# Patient Record
Sex: Male | Born: 1950 | Race: White | Hispanic: No | Marital: Married | Smoking: Former smoker
Health system: Southern US, Community
[De-identification: ages and names within clinical notes are randomized; demographics above are authoritative.]

## PROBLEM LIST (undated history)

## (undated) ENCOUNTER — Emergency Department (HOSPITAL_COMMUNITY): Admission: EM | Payer: Federal, State, Local not specified - PPO | Source: Home / Self Care

## (undated) DIAGNOSIS — Z86711 Personal history of pulmonary embolism: Secondary | ICD-10-CM

## (undated) DIAGNOSIS — R0602 Shortness of breath: Secondary | ICD-10-CM

## (undated) DIAGNOSIS — D649 Anemia, unspecified: Secondary | ICD-10-CM

## (undated) DIAGNOSIS — I10 Essential (primary) hypertension: Secondary | ICD-10-CM

## (undated) DIAGNOSIS — J069 Acute upper respiratory infection, unspecified: Secondary | ICD-10-CM

## (undated) DIAGNOSIS — F419 Anxiety disorder, unspecified: Secondary | ICD-10-CM

## (undated) DIAGNOSIS — K219 Gastro-esophageal reflux disease without esophagitis: Secondary | ICD-10-CM

## (undated) DIAGNOSIS — G8929 Other chronic pain: Secondary | ICD-10-CM

## (undated) DIAGNOSIS — Z903 Acquired absence of stomach [part of]: Secondary | ICD-10-CM

## (undated) DIAGNOSIS — F1010 Alcohol abuse, uncomplicated: Secondary | ICD-10-CM

## (undated) DIAGNOSIS — K2210 Ulcer of esophagus without bleeding: Secondary | ICD-10-CM

## (undated) DIAGNOSIS — K567 Ileus, unspecified: Secondary | ICD-10-CM

## (undated) DIAGNOSIS — C259 Malignant neoplasm of pancreas, unspecified: Secondary | ICD-10-CM

## (undated) DIAGNOSIS — E538 Deficiency of other specified B group vitamins: Secondary | ICD-10-CM

## (undated) DIAGNOSIS — C189 Malignant neoplasm of colon, unspecified: Secondary | ICD-10-CM

## (undated) DIAGNOSIS — K56609 Unspecified intestinal obstruction, unspecified as to partial versus complete obstruction: Secondary | ICD-10-CM

## (undated) DIAGNOSIS — IMO0001 Reserved for inherently not codable concepts without codable children: Secondary | ICD-10-CM

## (undated) DIAGNOSIS — D509 Iron deficiency anemia, unspecified: Secondary | ICD-10-CM

## (undated) DIAGNOSIS — R079 Chest pain, unspecified: Secondary | ICD-10-CM

## (undated) DIAGNOSIS — M1990 Unspecified osteoarthritis, unspecified site: Secondary | ICD-10-CM

## (undated) DIAGNOSIS — Z8619 Personal history of other infectious and parasitic diseases: Secondary | ICD-10-CM

## (undated) DIAGNOSIS — C3490 Malignant neoplasm of unspecified part of unspecified bronchus or lung: Secondary | ICD-10-CM

## (undated) DIAGNOSIS — R569 Unspecified convulsions: Secondary | ICD-10-CM

## (undated) DIAGNOSIS — Z5189 Encounter for other specified aftercare: Secondary | ICD-10-CM

## (undated) DIAGNOSIS — I2699 Other pulmonary embolism without acute cor pulmonale: Secondary | ICD-10-CM

## (undated) DIAGNOSIS — J40 Bronchitis, not specified as acute or chronic: Secondary | ICD-10-CM

## (undated) DIAGNOSIS — J189 Pneumonia, unspecified organism: Secondary | ICD-10-CM

## (undated) DIAGNOSIS — Z9889 Other specified postprocedural states: Secondary | ICD-10-CM

## (undated) DIAGNOSIS — N289 Disorder of kidney and ureter, unspecified: Secondary | ICD-10-CM

## (undated) DIAGNOSIS — G459 Transient cerebral ischemic attack, unspecified: Secondary | ICD-10-CM

## (undated) DIAGNOSIS — K2270 Barrett's esophagus without dysplasia: Secondary | ICD-10-CM

## (undated) DIAGNOSIS — R109 Unspecified abdominal pain: Secondary | ICD-10-CM

## (undated) HISTORY — DX: Unspecified convulsions: R56.9

## (undated) HISTORY — DX: Bronchitis, not specified as acute or chronic: J40

## (undated) HISTORY — DX: Barrett's esophagus without dysplasia: K22.70

## (undated) HISTORY — DX: Malignant neoplasm of unspecified part of unspecified bronchus or lung: C34.90

## (undated) HISTORY — DX: Deficiency of other specified B group vitamins: E53.8

## (undated) HISTORY — PX: CHOLECYSTECTOMY: SHX55

## (undated) HISTORY — DX: Unspecified intestinal obstruction, unspecified as to partial versus complete obstruction: K56.609

## (undated) HISTORY — PX: HERNIA REPAIR: SHX51

## (undated) HISTORY — DX: Other specified postprocedural states: Z98.890

## (undated) HISTORY — DX: Personal history of other infectious and parasitic diseases: Z86.19

## (undated) HISTORY — PX: PORTACATH PLACEMENT: SHX2246

## (undated) HISTORY — PX: APPENDECTOMY: SHX54

## (undated) HISTORY — PX: OTHER SURGICAL HISTORY: SHX169

## (undated) HISTORY — DX: Iron deficiency anemia, unspecified: D50.9

## (undated) HISTORY — PX: ABDOMINAL EXPLORATION SURGERY: SHX538

## (undated) HISTORY — DX: Malignant neoplasm of colon, unspecified: C18.9

---

## 2009-12-20 DIAGNOSIS — G459 Transient cerebral ischemic attack, unspecified: Secondary | ICD-10-CM

## 2009-12-20 HISTORY — DX: Transient cerebral ischemic attack, unspecified: G45.9

## 2009-12-31 ENCOUNTER — Emergency Department (HOSPITAL_COMMUNITY): Admission: EM | Admit: 2009-12-31 | Discharge: 2009-12-31 | Payer: Self-pay | Admitting: Emergency Medicine

## 2010-02-19 DIAGNOSIS — I2699 Other pulmonary embolism without acute cor pulmonale: Secondary | ICD-10-CM

## 2010-02-19 HISTORY — DX: Other pulmonary embolism without acute cor pulmonale: I26.99

## 2010-02-24 ENCOUNTER — Emergency Department (HOSPITAL_COMMUNITY)
Admission: EM | Admit: 2010-02-24 | Discharge: 2010-02-24 | Payer: Self-pay | Source: Home / Self Care | Admitting: Emergency Medicine

## 2010-02-24 ENCOUNTER — Observation Stay (HOSPITAL_COMMUNITY)
Admission: EM | Admit: 2010-02-24 | Discharge: 2010-02-27 | Payer: Self-pay | Source: Home / Self Care | Attending: Internal Medicine | Admitting: Internal Medicine

## 2010-02-25 ENCOUNTER — Encounter (INDEPENDENT_AMBULATORY_CARE_PROVIDER_SITE_OTHER): Payer: Self-pay | Admitting: Internal Medicine

## 2010-04-09 ENCOUNTER — Emergency Department (HOSPITAL_COMMUNITY)
Admission: EM | Admit: 2010-04-09 | Discharge: 2010-04-10 | Disposition: A | Discharge status: Other Acute Inpatient Hospital | Payer: Self-pay | Source: Home / Self Care | Admitting: Emergency Medicine

## 2010-04-10 ENCOUNTER — Inpatient Hospital Stay (HOSPITAL_COMMUNITY)
Admission: AD | Admit: 2010-04-10 | Discharge: 2010-04-13 | Disposition: A | Discharge status: Still In Hospital | Payer: Self-pay | Source: Home / Self Care | Attending: Psychiatry | Admitting: Psychiatry

## 2010-04-13 ENCOUNTER — Observation Stay (HOSPITAL_COMMUNITY)
Admission: EM | Admit: 2010-04-13 | Discharge: 2010-04-15 | Payer: Self-pay | Source: Home / Self Care | Attending: Internal Medicine | Admitting: Internal Medicine

## 2010-04-13 LAB — CBC
HCT: 40.8 % (ref 39.0–52.0)
Hemoglobin: 13.5 g/dL (ref 13.0–17.0)
RBC: 4.13 MIL/uL — ABNORMAL LOW (ref 4.22–5.81)
RDW: 12.9 % (ref 11.5–15.5)
WBC: 10.9 10*3/uL — ABNORMAL HIGH (ref 4.0–10.5)

## 2010-04-13 LAB — COMPREHENSIVE METABOLIC PANEL
ALT: 15 U/L (ref 0–53)
AST: 24 U/L (ref 0–37)
Alkaline Phosphatase: 64 U/L (ref 39–117)
CO2: 26 mEq/L (ref 19–32)
Calcium: 9 mg/dL (ref 8.4–10.5)
GFR calc Af Amer: >60 mL/min (ref >60)
GFR calc non Af Amer: >60 mL/min (ref >60)
Potassium: 5 mEq/L (ref 3.5–5.1)
Sodium: 141 mEq/L (ref 135–145)

## 2010-04-13 LAB — DIFFERENTIAL
Basophils Absolute: 0 10*3/uL (ref 0.0–0.1)
Eosinophils Relative: 1 % (ref 0–5)
Lymphocytes Relative: 23 % (ref 12–46)
Lymphs Abs: 2.5 10*3/uL (ref 0.7–4.0)
Neutro Abs: 7 10*3/uL (ref 1.7–7.7)
Neutrophils Relative %: 64 % (ref 43–77)

## 2010-04-13 LAB — RAPID URINE DRUG SCREEN, HOSP PERFORMED
Benzodiazepines: POSITIVE — AB
Cocaine: NOT DETECTED
Tetrahydrocannabinol: NOT DETECTED

## 2010-04-13 LAB — PROTIME-INR: Prothrombin Time: 21.9 seconds — ABNORMAL HIGH (ref 11.6–15.2)

## 2010-04-14 LAB — COMPREHENSIVE METABOLIC PANEL
ALT: 14 U/L (ref 0–53)
Alkaline Phosphatase: 56 U/L (ref 39–117)
CO2: 25 mEq/L (ref 19–32)
Glucose, Bld: 104 mg/dL — ABNORMAL HIGH (ref 70–99)
Potassium: 4.9 mEq/L (ref 3.5–5.1)
Sodium: 138 mEq/L (ref 135–145)
Total Protein: 5.8 g/dL — ABNORMAL LOW (ref 6.0–8.3)

## 2010-04-14 LAB — DIFFERENTIAL
Lymphocytes Relative: 28 % (ref 12–46)
Monocytes Absolute: 1 10*3/uL (ref 0.1–1.0)
Monocytes Relative: 13 % — ABNORMAL HIGH (ref 3–12)
Neutro Abs: 4.6 10*3/uL (ref 1.7–7.7)

## 2010-04-14 LAB — POCT I-STAT, CHEM 8
BUN: 19 mg/dL (ref 6–23)
Creatinine, Ser: 1 mg/dL (ref 0.4–1.5)
Potassium: 4.8 mEq/L (ref 3.5–5.1)
Sodium: 136 mEq/L (ref 135–145)

## 2010-04-14 LAB — CBC
HCT: 37.2 % — ABNORMAL LOW (ref 39.0–52.0)
Hemoglobin: 12.4 g/dL — ABNORMAL LOW (ref 13.0–17.0)
MCH: 33 pg (ref 26.0–34.0)
MCHC: 33.3 g/dL (ref 30.0–36.0)

## 2010-04-14 LAB — TROPONIN I: Troponin I: <0.01 ng/mL (ref 0.00–0.06)

## 2010-04-14 LAB — CK TOTAL AND CKMB (NOT AT ARMC)
CK, MB: 1.1 ng/mL (ref 0.3–4.0)
Relative Index: INVALID (ref 0.0–2.5)
Relative Index: INVALID (ref 0.0–2.5)

## 2010-04-14 LAB — PROTIME-INR
INR: 1.6 — ABNORMAL HIGH (ref 0.00–1.49)
INR: 2.19 — ABNORMAL HIGH (ref 0.00–1.49)

## 2010-04-15 DIAGNOSIS — F1020 Alcohol dependence, uncomplicated: Secondary | ICD-10-CM

## 2010-04-15 LAB — CARDIAC PANEL(CRET KIN+CKTOT+MB+TROPI)
CK, MB: 1 ng/mL (ref 0.3–4.0)
CK, MB: 0.7 ng/mL (ref 0.3–4.0)
Relative Index: INVALID (ref 0.0–2.5)
Relative Index: INVALID (ref 0.0–2.5)
Total CK: 41 U/L (ref 7–232)
Total CK: 38 U/L (ref 7–232)

## 2010-04-15 LAB — BASIC METABOLIC PANEL
BUN: 14 mg/dL (ref 6–23)
CO2: 28 mEq/L (ref 19–32)
CO2: 27 mEq/L (ref 19–32)
Calcium: 8.9 mg/dL (ref 8.4–10.5)
Chloride: 105 mEq/L (ref 96–112)
Creatinine, Ser: 0.79 mg/dL (ref 0.4–1.5)
GFR calc Af Amer: >60 mL/min (ref >60)
GFR calc non Af Amer: >60 mL/min (ref >60)
Glucose, Bld: 109 mg/dL — ABNORMAL HIGH (ref 70–99)
Potassium: 4.3 mEq/L (ref 3.5–5.1)
Sodium: 143 mEq/L (ref 135–145)

## 2010-04-15 LAB — CBC
HCT: 39.4 % (ref 39.0–52.0)
MCHC: 32.7 g/dL (ref 30.0–36.0)
MCV: 99.2 fL (ref 78.0–100.0)
Platelets: 231 10*3/uL (ref 150–400)
RDW: 12.6 % (ref 11.5–15.5)

## 2010-04-15 LAB — LIPID PANEL
Cholesterol: 174 mg/dL (ref 0–200)
Total CHOL/HDL Ratio: 3.2 RATIO
VLDL: 27 mg/dL (ref 0–40)

## 2010-04-15 LAB — FOLATE: Folate: 15 ng/mL

## 2010-04-15 NOTE — Discharge Summary (Addendum)
[name]              ACCOUNT NO.:  [id number]  MEDICAL RECORD NO.:  [id number]          PATIENT TYPE:  IPS  LOCATION:  0506                          FACILITY:  [facility]  PHYSICIAN:  Marlis Edelson, DO        DATE OF BIRTH:  09-09-1950  DATE OF ADMISSION:  04/10/2010 DATE OF DISCHARGE:  04/13/2010                              DISCHARGE SUMMARY   CHIEF COMPLAINT:  Detoxification.  HISTORY OF THE CHIEF COMPLAINT:  [name] is a 60 year old Caucasian male admitted to the [facility] following evaluation at the [facility] Emergency Department.  He stated that he needed help detoxifying from alcohol.  He was a wine drinker, per his report.  He re-presented to the hospital, stating that he last drank around 1:00 p.m.; that was around 10:00 at night.  He had been noted to be anxious and unable to sit up on arrival.  He was also complaining about mild back pain.  He had no measurable alcohol level at the time.  Urine drug screen was positive for benzodiazepines.  Glucose was slightly elevated at 114.  INR was 189.  [name] was previously at the [facility] when he came in for evaluation of chest pain.  He has had a history of pulmonary embolism and was started on Coumadin.  His past psychiatric history was unremarkable.  His medical history was significant for pulmonary embolism.  MEDICATIONS: 1. Folic acid 1 mg daily. 2. Diovan/hydrochlorothiazide 160/12.5 one daily. 3. Coumadin 5 mg one to 1-1/2 daily. 4. Thiamine one daily. 5. Carafate 1 gram q.i.d. 6. Protonix EC one tablet q.a.m. 7. Zoloft 25 mg nightly. 8. Vitamin B12 injection monthly. 9. Vitamin B1 injection monthly.  HOSPITAL COURSE:  Ms. Greenlaw was placed on the adult unit where he was started on an alcohol detoxification program.  He had an established history of hypertension, pulmonary embolism, and reflux esophagitis.  He was placed on the Coumadin protocol per pharmacy  management.  In addition, he was placed on the Librium detoxification protocol that included the use of thiamine.  He was maintained on Diovan, sertraline, sucralfate, and Protonix as noted above.  He was also given Ultram for approximately 72 hours for severe back pain.  On April 13, 2010, following an uneventful detoxification with no severe withdrawal symptoms and positive participation in the program, he developed an acute onset of blurred vision while reading.  He stated he put his book down but then noticed that his left lower extremity began to feel numb and heavy.  This was an abrupt onset.  He had decreased sensation to the lower extremity.  He was diaphoretic with a blood pressure of 117/80 and a heart rate of 111.  Due to his history of pulmonary embolism and the acute onset, he was transferred emergently to the [facility] ER for acute laboratory and imaging studies.  We were very concerned, particularly given his thrombosis and hypertension history, that this acute neurological change warranted emergent care, thus that was provided.  He was then admitted to the hospital from the emergency department.  LABORATORY/IMAGING:  Please  see medical record.  CONSULTATION:  The patient was sent emergently to [facility] Emergency Department on April 13, 2010, at 19:45 hours.  PROCEDURES:  None.  COMPLICATIONS:  None.  MENTAL STATUS EXAMINATION AT THE TIME OF TRANSFER:  Unremarkable for suicidal or homicidal ideation, no psychosis.  DISCHARGE DIAGNOSIS:  Axis I:  Alcohol dependence.  Acute change in neurological status. Axis II:  Deferred. Axis III:  Hypertension, pulmonary embolism, reflux esophagitis. Axis IV:  Acute onset medical illness. Axis V:  Global Assessment Functioning 50.  DISCHARGE INSTRUCTIONS:  The patient was admitted to [facility] for further neurological and emergent care.  PROGNOSIS:  Per that  hospitalization.          ______________________________ Marlis Edelson, DO     DB/MEDQ  D:  04/14/2010  T:  04/14/2010  Job:  [id number]  Electronically Signed by Marlis Edelson MD on 04/15/2010 10:12:47 PM

## 2010-04-20 NOTE — Consult Note (Signed)
  NAMERUDI, BUNYARD              ACCOUNT NO.:  [id number]  MEDICAL RECORD NO.:  [id number]           PATIENT TYPE:  LOCATION:                                 FACILITY:  PHYSICIAN:  Eulogio Ditch, MD DATE OF BIRTH:  [birth year]  DATE OF CONSULTATION:  04/15/2010 DATE OF DISCHARGE:                                CONSULTATION   REASON FOR CONSULTATION:  Alcohol detox.  HISTORY OF PRESENT ILLNESS:  A 60 year old white male who was admitted from Behavioral Health to [facility] as he developed blurred vision.  The patient was admitted on April 10, 2010, to behavioral health.  The patient was started on low-dose Librium protocol.  The patient has no significant past psych history.  The patient has significant medical history for pulmonary embolism along with hypertension and reflux esophagitis.  Today when I saw the patient the patient wanted to go to behavioral health because he wanted to speak with the social worker regarding the rehab.  I told the patient that his detox is already finished and he can go to rehab either in the outpatient setting or inpatient from the medical floor and the social worker can help him regarding this.  The patient told me that he will think on whether he want to go inpatient or outpatient.  The patient denied any suicidal or homicidal ideations at this time.  He is very logical and goal directed during the interview.  Denied hearing any voices.  The patient do not have any withdrawal symptoms at this time.  PAST PSYCHIATRIC HISTORY:  No significant past psych history.  CURRENT MEDICATIONS:  The patient has a folic acid 1 mg p.o. daily and thiamin 100 mg p.o. daily.  MENTAL STATUS EXAMINATION:  Calm and cooperative during the interview. Fair eye contact.  Speech normal.  Hygiene and grooming normal.  Mood: Euthymic affect, mood congruent.  Thought process, logical and goal directed.  Thought content not suicidal or homicidal, not  delusional. Thought perception, no audio or visual hallucination reported.  Not internally preoccupied.  Cognition alert, awake, and oriented x3. Memory immediate, recent, remote and intact.  Fund of knowledge fair. Attention and concentration good.  Abstraction ability good.  Insight and judgment intact.  DIAGNOSES:  Axis I:  Chronic alcohol dependence. Axis II:  Deferred. Axis III:  Hypertension, pulmonary embolism, and reflux esophagitis. Axis IV:  Alcohol abuse. Axis: V:  Global assessment functioning 55.  RECOMMENDATIONS:  The patient can be followed in the outpatient setting or inpatient setting for the rehab program.  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  He can also follow through [place] in [place] area.  Social work can help in disposition of the patient regarding the rehab.  The patient does not need to come to the behavioral health at this time for further detox.  His detox is already complete.     Eulogio Ditch, MD     SA/MEDQ  D:  04/15/2010  T:  04/15/2010  Job:  [id number]  Electronically Signed by Eulogio Ditch  on 04/20/2010 06:21:59 PM

## 2010-05-05 NOTE — Discharge Summary (Signed)
[name]              ACCOUNT NO.:  [id number]  MEDICAL RECORD NO.:  [id number]          PATIENT TYPE:  OBV  LOCATION:  4739                         FACILITY:  [facility]  PHYSICIAN:  Baltazar Najjar, MD     DATE OF BIRTH:  [birth year]  DATE OF ADMISSION:  04/13/2010 DATE OF DISCHARGE:  04/15/2010                              DISCHARGE SUMMARY   FINAL DISCHARGE DIAGNOSES: 1. Left-sided weakness/rule out transient ischemic attack, resolved. 2. History of pulmonary embolism on Coumadin. 3. Hypertension, well controlled. 4. Alcohol dependence status post detoxification.  CONSULTATION:  Dr. Eulogio Ditch, from [facility].  IMAGING/STUDIES: 1. CT head on April 13, 2009, showed no acute finding, atrophic and     chronic small vessel disease. 2. MRI brain showed no acute abnormalities, mild chronic microvascular     ischemia. 3. MRA head is negative. 4. Carotid Duplex showed preliminary report of no ICA stenosis,     vertebral antegrade. 5. A 2-D echocardiogram was unremarkable except for grade 1 diastolic     dysfunction, EF is 55 to 60%.  BRIEF ADMITTING HISTORY:  Please refer to the H and P dictated on April 13, 2009.  On summary, [name] is a 60 year old Caucasian man who was transferred from [facility] to [facility] after he had an episode of left-sided weakness. There was no associated slurring of speech or any difficulty swallowing.  HOSPITAL COURSE: 1. In the ED, code stroke was called.  The patient was found to be not     a candidate for TPA.  The patient was admitted to telemetry floor     for stroke workup.  He had MRI and MRA of the brain.  He also had     duplex sonogram of his carotids results as above.  His 2-D     echocardiogram was unremarkable.  The patient's symptoms have     completely resolved, his workup was unremarkable. 2. Low TSH/low T4 level.  The patient is currently asymptomatic.     Looking  back in his records in this last month, his TSH was within     normal range.  Currently slightly low with slightly low T4 level.     I would recommend to repeat those tests prior to initiation of any     treatments or workup.  I will defer that to his PCP to repeat his     TSH and free T4 level and if it is abnormal, to proceed with     further workup.  However, at this time the patient is asymptomatic     and since his TSH was normal last month, I am hesitant to start any     treatment prior confirmation and I will defer that to his PCP. 3. History of alcohol dependency status post detoxification.  The     patient to continue folate and thiamine supplementation.  He was     seen by Dr. Rogers Blocker from [facility] who recommended     outpatient versus inpatient rehab.  Social worker is currently     working to address this recommendation. 4.  Hypertension well controlled.  Currently off medication.  Continue     to monitor blood pressure very closely. 5. History of pulmonary embolism on Coumadin.  INR is subtherapeutic    today at 1.8.  The patient was given 1 dose of 10 mg of Coumadin in     the hospital.  I will discharge him on his home regimen of 7.5 mg.     He will need repeat INR in 2 days to adjust his medicine as needed     by his PCP.  I will discuss that with the social worker to set up a     repeat INR check up before discharge.  DISCHARGE MEDICATIONS: 1. Aspirin 81 mg p.o. daily. 2. Multivitamin 1 tablet p.o. daily. 3. Thiamine 100 mg p.o. daily. 4. Folic acid 1 mg p.o. daily. 5. Protonix 40 mg p.o. daily. 6. Vitamin B over-the-counter 1 tablet daily. 7. Coumadin 7.5 mg daily. 8. Zoloft 25 mg at bedtime.  DISCHARGE INSTRUCTIONS: 1. The patient to continue above medications as prescribed. 2. The patient to follow with his PCP within 1 week period for a     follow-up visit. 3. The patient to follow with Coumadin clinic/PCP office for INR check     in 2 days.   Social worker to set up an appointment prior to     discharge. 4. Plans 4 either outpatient alcohol rehab versus inpatient rehab as     arranged by Child psychotherapist.  CONDITION ON DISCHARGE:  Stable.          ______________________________ Baltazar Najjar, MD     SA/MEDQ  D:  04/15/2010  T:  04/15/2010  Job:  [id number]  cc:   Dr. Burna Cash  Electronically Signed by Hannah Beat MD on 05/05/2010 08:13:00 PM

## 2010-05-05 NOTE — Discharge Summary (Signed)
  [name], [name]              ACCOUNT NO.:  [id number]  MEDICAL RECORD NO.:  [id number]          PATIENT TYPE:  OBV  LOCATION:  4739                         FACILITY:  [facility]  PHYSICIAN:  Baltazar Najjar, MD     DATE OF BIRTH:  06-Dec-1950  DATE OF ADMISSION:  04/13/2010 DATE OF DISCHARGE:                              DISCHARGE SUMMARY   ADDENDUM: I was informed by the nurse that the patient declined physical therapy evaluation and he is ambulating well.  However, given the fact that the patient is on Coumadin and he is at high risk for complication if he had a fall, I discussed that with the patient.  I encouraged him and advised him to comply with physical therapy evaluation.  He is now agreeable. Physical Therapy to see the patient prior to discharge and to determine disposition.          ______________________________ Baltazar Najjar, MD     SA/MEDQ  D:  04/15/2010  T:  04/15/2010  Job:  [id number]  cc:   Jerolyn Shin, MD  Electronically Signed by Hannah Beat MD on 05/05/2010 08:12:53 PM

## 2010-05-21 NOTE — H&P (Signed)
[name], [name]              ACCOUNT NO.:  [id number]  MEDICAL RECORD NO.:  [id number]          PATIENT TYPE:  IPS  LOCATION:  0506                          [facility]ACILITY[facility]  [facility]  PHYSICIAN:  Vic Ripper, P.A.-C.DATE OF BIRTH:  06-Aug-1950  DATE OF ADMISSION:  04/10/2010 DATE OF DISCHARGE:                      PSYCHIATRIC ADMISSION ASSESSMENT   This is a voluntary admission to the services of Dr. Marlis Edelson.  This is a 60 year old married white male.  He presented to the ED at [facility]. He stated that he needed help detoxifying from alcohol, specifically wine. He presented to the hospital about 10:00 at night. He had had his last drink about 1 p.m.  He was noted to be anxious and unable to sit upon arrival. He was also complaining about mild back pain.  Interestingly enough, he had no measurable alcohol. His UDS was positive for benzodiazepines.  His glucose was slightly elevated at 114 and his INR was 1.89.  [name] was here at the [facility]. This would have been December 7th through 9th and he came in for evaluation of chest pain. Apparently since that time, he suffered a pulmonary embolus and has been started on Coumadin.  PAST PSYCHIATRIC HISTORY:  Denies any.  SOCIAL HISTORY:  He reports having obtained his BA in 1996.  He has been married once for 38 years.  He has a 72 year old daughter, a 3 year old daughter and a 38 year old son.  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  ALCOHOL AND DRUG HISTORY:  The last 2 years he has been heavily drinking wine, 32-54 ounces a day.  He often starts drinking in the morning.  He denies any legal issues or DUIs, although 12-13 years ago he reports having been arrested for drinking in public.  His medical care provider is Dr. Ignacia Palma. His medical problems: Apparently while he was with [place] here, he was thought to have noncardiac chest pain  representative of reflux esophagitis, hypertension and an anxiety state.  He apparently has had a PE since then. We will have to get the records from [facility].  MEDICATIONS:  He reports that he gets his meds through [facility] in [place].  He is prescribed for hypertension; however, the last fill was in June 2011 and he never filled his Protonix, nor did he fill his Carafate. He is prescribed: 1. Folic acid 1 mg p.o. daily. 2. Diovan HCT 160/12.5 one p.o. daily. 3. Coumadin 5 mg 1 to 1-1/2 p.o. daily. 4. Thiamine 1 tablet p.o. daily. 5. Carafate 1 gram q.i.d. 6. Protonix EC 1 tablet q.a.m. 7. Zoloft 25 mg at bedtime. 8. Vitamin B12 100 mcg subcu monthly. 9. Vitamin B1 injection one dose monthly.  He has no known drug allergies.  POSITIVE PHYSICAL FINDINGS:  He is well-developed, well-nourished white male who appears his stated age.  He is in no acute distress. VITAL SIGNS:  Stable.  Temperature was afebrile 98.1-98.6. Pulse was 78- 115. Respirations were 15-20 and blood pressure was 127/77 to 142/92. His pertinent labs have already been commented on and interestingly, he  has no evidence for tremulousness and he is not reporting any internal tremulousness.  MENTAL STATUS EXAM TODAY:  He is alert and oriented.  He is appropriately groomed, dressed and nourished in his own clothing.  His speech is a normal rate, rhythm and tone.  His mood is appropriate to the situation.  Thought processes are clear, rational and goal oriented. He wants to be able to stop drinking.  Judgment and insight are intact. Concentration and memory are intact.  Intelligence is at least average. He denies being suicidal or homicidal.  He denies any auditory or visual hallucinations.  AXIS I:  Alcohol dependence. AXIS II:  Deferred. AXIS III:  Hypertension, pulmonary embolism by his report, reflux esophagitis. AXIS IV:  Moderate, mostly from his own drinking. AXIS V:  50.  PLAN:  Admit for  safety and stabilization.  We will help him withdraw from alcohol through use of the low-dose Librium protocol.  He does live in [place], [place].  He is a Cytogeneticist.  He does have [employer]. We can explore further substance abuse support through the [place] up in that area through [employer] or he can work that out through [employer].  Estimated length of stay is 3-5 days.     Vic Ripper, P.A.-C.     MD/MEDQ  D:  04/11/2010  T:  04/11/2010  Job:  [id number]  Electronically Signed by Jaci Lazier ADAMS P.A.-C. on 04/19/2010 01:56:20 PM Electronically Signed by Marlis Edelson MD on 05/21/2010 09:49:17 PM

## 2010-06-02 LAB — BASIC METABOLIC PANEL
BUN: 6 mg/dL (ref 6–23)
CO2: 23 mEq/L (ref 19–32)
Calcium: 8.9 mg/dL (ref 8.4–10.5)
Creatinine, Ser: 0.88 mg/dL (ref 0.4–1.5)
Glucose, Bld: 107 mg/dL — ABNORMAL HIGH (ref 70–99)

## 2010-06-02 LAB — LIPID PANEL
HDL: 64 mg/dL (ref >39)
LDL Cholesterol: 84 mg/dL (ref 0–99)
Total CHOL/HDL Ratio: 2.8 RATIO
Triglycerides: 150 mg/dL — ABNORMAL HIGH (ref <150)
Triglycerides: 164 mg/dL — ABNORMAL HIGH (ref <150)
VLDL: 30 mg/dL (ref 0–40)
VLDL: 33 mg/dL (ref 0–40)

## 2010-06-02 LAB — COMPREHENSIVE METABOLIC PANEL
BUN: 5 mg/dL — ABNORMAL LOW (ref 6–23)
Calcium: 8 mg/dL — ABNORMAL LOW (ref 8.4–10.5)
Glucose, Bld: 90 mg/dL (ref 70–99)
Total Protein: 4.8 g/dL — ABNORMAL LOW (ref 6.0–8.3)

## 2010-06-02 LAB — RAPID URINE DRUG SCREEN, HOSP PERFORMED
Amphetamines: NOT DETECTED
Cocaine: NOT DETECTED
Tetrahydrocannabinol: NOT DETECTED

## 2010-06-02 LAB — CK TOTAL AND CKMB (NOT AT ARMC)
CK, MB: 1.1 ng/mL (ref 0.3–4.0)
Total CK: 69 U/L (ref 7–232)

## 2010-06-02 LAB — DIFFERENTIAL
Basophils Relative: 0 % (ref 0–1)
Eosinophils Absolute: 0.1 10*3/uL (ref 0.0–0.7)
Monocytes Relative: 12 % (ref 3–12)
Neutrophils Relative %: 49 % (ref 43–77)

## 2010-06-02 LAB — CBC
HCT: 38.5 % — ABNORMAL LOW (ref 39.0–52.0)
HCT: 40.5 % (ref 39.0–52.0)
Hemoglobin: 14.7 g/dL (ref 13.0–17.0)
MCH: 33.9 pg (ref 26.0–34.0)
MCH: 33.2 pg (ref 26.0–34.0)
MCHC: 35.3 g/dL (ref 30.0–36.0)
MCHC: 33.5 g/dL (ref 30.0–36.0)
MCV: 99.2 fL (ref 78.0–100.0)
Platelets: 206 10*3/uL (ref 150–400)
RDW: 12.5 % (ref 11.5–15.5)
RDW: 12.1 % (ref 11.5–15.5)
RDW: 12.7 % (ref 11.5–15.5)
WBC: 6.4 10*3/uL (ref 4.0–10.5)

## 2010-06-02 LAB — POCT CARDIAC MARKERS
CKMB, poc: <1 ng/mL — ABNORMAL LOW (ref 1.0–8.0)
Myoglobin, poc: 47.1 ng/mL (ref 12–200)
Myoglobin, poc: 54.8 ng/mL (ref 12–200)
Myoglobin, poc: 49.7 ng/mL (ref 12–200)

## 2010-06-02 LAB — CARDIAC PANEL(CRET KIN+CKTOT+MB+TROPI)
Relative Index: INVALID (ref 0.0–2.5)
Total CK: 66 U/L (ref 7–232)
Troponin I: 0.01 ng/mL (ref 0.00–0.06)

## 2010-06-02 LAB — PSA: PSA: 1.41 ng/mL (ref <=4.00)

## 2010-06-02 LAB — TYPE AND SCREEN: Antibody Screen: NEGATIVE

## 2010-06-02 LAB — LIPASE, BLOOD: Lipase: 18 U/L (ref 11–59)

## 2010-06-04 LAB — URINALYSIS, ROUTINE W REFLEX MICROSCOPIC
Hgb urine dipstick: NEGATIVE
Nitrite: NEGATIVE
Specific Gravity, Urine: 1.01 (ref 1.005–1.030)
Urobilinogen, UA: 0.2 mg/dL (ref 0.0–1.0)

## 2010-07-02 ENCOUNTER — Inpatient Hospital Stay (HOSPITAL_COMMUNITY)
Admission: EM | Admit: 2010-07-02 | Discharge: 2010-07-11 | DRG: 181 | Disposition: A | Discharge status: Home / Self Care | Payer: Federal, State, Local not specified - PPO | Attending: General Surgery | Admitting: General Surgery

## 2010-07-02 DIAGNOSIS — Z86718 Personal history of other venous thrombosis and embolism: Secondary | ICD-10-CM

## 2010-07-02 DIAGNOSIS — K56609 Unspecified intestinal obstruction, unspecified as to partial versus complete obstruction: Principal | ICD-10-CM | POA: Diagnosis present

## 2010-07-02 DIAGNOSIS — I10 Essential (primary) hypertension: Secondary | ICD-10-CM | POA: Diagnosis present

## 2010-07-02 DIAGNOSIS — Z7901 Long term (current) use of anticoagulants: Secondary | ICD-10-CM

## 2010-07-02 LAB — BASIC METABOLIC PANEL
CO2: 26 mEq/L (ref 19–32)
Calcium: 9.5 mg/dL (ref 8.4–10.5)
Creatinine, Ser: 0.82 mg/dL (ref 0.4–1.5)
GFR calc Af Amer: >60 mL/min (ref >60)
GFR calc non Af Amer: >60 mL/min (ref >60)
Sodium: 134 mEq/L — ABNORMAL LOW (ref 135–145)

## 2010-07-03 LAB — PROTIME-INR
INR: 2.11 — ABNORMAL HIGH (ref 0.00–1.49)
Prothrombin Time: 23.8 seconds — ABNORMAL HIGH (ref 11.6–15.2)

## 2010-07-03 LAB — APTT: aPTT: 38 seconds — ABNORMAL HIGH (ref 24–37)

## 2010-07-04 LAB — CBC
MCHC: 32.7 g/dL (ref 30.0–36.0)
Platelets: 209 10*3/uL (ref 150–400)
RDW: 12.7 % (ref 11.5–15.5)
WBC: 8.2 10*3/uL (ref 4.0–10.5)

## 2010-07-04 LAB — COMPREHENSIVE METABOLIC PANEL
ALT: 12 U/L (ref 0–53)
Albumin: 3.1 g/dL — ABNORMAL LOW (ref 3.5–5.2)
Alkaline Phosphatase: 76 U/L (ref 39–117)
Calcium: 8.6 mg/dL (ref 8.4–10.5)
Glucose, Bld: 64 mg/dL — ABNORMAL LOW (ref 70–99)
Potassium: 3.9 mEq/L (ref 3.5–5.1)
Sodium: 137 mEq/L (ref 135–145)
Total Protein: 5.5 g/dL — ABNORMAL LOW (ref 6.0–8.3)

## 2010-07-04 LAB — DIFFERENTIAL
Basophils Absolute: 0 10*3/uL (ref 0.0–0.1)
Basophils Relative: 0 % (ref 0–1)
Eosinophils Absolute: 0.2 10*3/uL (ref 0.0–0.7)
Eosinophils Relative: 2 % (ref 0–5)
Monocytes Absolute: 0.8 10*3/uL (ref 0.1–1.0)

## 2010-07-04 LAB — PHOSPHORUS: Phosphorus: 3.9 mg/dL (ref 2.3–4.6)

## 2010-07-04 LAB — MAGNESIUM: Magnesium: 1.6 mg/dL (ref 1.5–2.5)

## 2010-07-05 LAB — DIFFERENTIAL
Basophils Absolute: 0 10*3/uL (ref 0.0–0.1)
Eosinophils Relative: 2 % (ref 0–5)
Lymphocytes Relative: 35 % (ref 12–46)
Lymphs Abs: 2.5 10*3/uL (ref 0.7–4.0)
Neutro Abs: 3.6 10*3/uL (ref 1.7–7.7)

## 2010-07-05 LAB — COMPREHENSIVE METABOLIC PANEL
ALT: 10 U/L (ref 0–53)
Alkaline Phosphatase: 78 U/L (ref 39–117)
BUN: 5 mg/dL — ABNORMAL LOW (ref 6–23)
CO2: 32 mEq/L (ref 19–32)
Chloride: 100 mEq/L (ref 96–112)
GFR calc non Af Amer: >60 mL/min (ref >60)
Glucose, Bld: 112 mg/dL — ABNORMAL HIGH (ref 70–99)
Potassium: 4.1 mEq/L (ref 3.5–5.1)
Sodium: 140 mEq/L (ref 135–145)
Total Bilirubin: 0.5 mg/dL (ref 0.3–1.2)

## 2010-07-05 LAB — CBC
HCT: 38.4 % — ABNORMAL LOW (ref 39.0–52.0)
Hemoglobin: 12.8 g/dL — ABNORMAL LOW (ref 13.0–17.0)
MCV: 91.4 fL (ref 78.0–100.0)
WBC: 7.1 10*3/uL (ref 4.0–10.5)

## 2010-07-05 LAB — MAGNESIUM: Magnesium: 1.5 mg/dL (ref 1.5–2.5)

## 2010-07-06 LAB — DIFFERENTIAL
Basophils Absolute: 0 10*3/uL (ref 0.0–0.1)
Basophils Relative: 0 % (ref 0–1)
Eosinophils Relative: 3 % (ref 0–5)
Monocytes Absolute: 0.8 10*3/uL (ref 0.1–1.0)

## 2010-07-06 LAB — COMPREHENSIVE METABOLIC PANEL
Alkaline Phosphatase: 73 U/L (ref 39–117)
BUN: 2 mg/dL — ABNORMAL LOW (ref 6–23)
Calcium: 8.3 mg/dL — ABNORMAL LOW (ref 8.4–10.5)
GFR calc non Af Amer: >60 mL/min (ref >60)
Glucose, Bld: 129 mg/dL — ABNORMAL HIGH (ref 70–99)
Potassium: 3.6 mEq/L (ref 3.5–5.1)
Total Protein: 5.3 g/dL — ABNORMAL LOW (ref 6.0–8.3)

## 2010-07-06 LAB — CBC
MCHC: 33.2 g/dL (ref 30.0–36.0)
RDW: 12.9 % (ref 11.5–15.5)

## 2010-07-06 LAB — MAGNESIUM: Magnesium: 1.9 mg/dL (ref 1.5–2.5)

## 2010-07-06 LAB — PROTIME-INR
INR: 1.25 (ref 0.00–1.49)
Prothrombin Time: 15.9 seconds — ABNORMAL HIGH (ref 11.6–15.2)

## 2010-07-06 LAB — PHOSPHORUS: Phosphorus: 4.3 mg/dL (ref 2.3–4.6)

## 2010-07-06 NOTE — H&P (Signed)
[name], [name]              ACCOUNT NO.:  [id number]  MEDICAL RECORD NO.:  [id number]           PATIENT TYPE:  I  LOCATION:  A320                          FACILITY:  [facility]  PHYSICIAN:  [name], MD      DATE OF BIRTH:  1950-11-05  DATE OF ADMISSION:  07/02/2010 DATE OF DISCHARGE:  LH                             HISTORY & PHYSICAL   CHIEF COMPLAINT:  Increasing abdominal pain.  HISTORY OF PRESENT ILLNESS:  The patient is a 60 year old male who presented to [facility] with several days of increasing abdominal discomfort and nausea and vomiting.  His last stated bowel movement was approximately 5 days ago.  He was noted to be normal at that time, no melena, no hematochezia.  He has not had any fevers and chills.  He has had increasing episodes of emesis, but no hematemesis. He has not passed flatus for the last 24-48 hours.  He has had similar symptomatology in the past.  He describes his abdominal pain is diffuse and colicky in nature.  No significant fevers or chills.  PAST MEDICAL HISTORY: 1. History of recent pulmonary embolism. 2. Hypertension. 3. History of alcohol abuse.  MEDICATIONS: 1. Coumadin. 2. Diovan. 3. Folic acid. 4. Protonix. 5. Sertraline  ALLERGIES:  NO KNOWN DRUG ALLERGIES.  SOCIAL HISTORY:  Half pack per day smoker.  Prior heavy alcohol use.  No recent alcohol use.  No history of DVT.  No recreational drug abuse.  FAMILY HISTORY:  Consistent for coronary artery disease, otherwise unremarkable and noncontributory.  REVIEW OF SYSTEMS:  CONSTITUTIONAL:  Unremarkable.  EYES:  Unremarkable. EARS, NOSE, and THROAT:  Unremarkable.  RESPIRATORY:  Current unremarkable.  GASTROINTESTINAL:  As per HPI.  GENITOURINARY: Unremarkable.  MUSCULOSKELETAL:  Unremarkable.  SKIN:  Unremarkable. ENDOCRINE:  Unremarkable.  NEURO:  Unremarkable.  PHYSICAL EXAMINATION:  VITAL SIGNS:  Temperature 98.1, heart rate 65, respirations 20, blood pressure  147/84, 96% O2 saturation on room air. GENERAL:  He is not in any acute distress.  He is alert and oriented x3. HEENT:  Scalp, no deformities or masses.  Eyes: Pupils equal, round, reactive to light.  Extraocular movements were intact.  No scleral icterus or conjunctival pallor is noted.  Oral mucosa pink.  Normal occlusion. NECK:  Trachea is midline.  No cervical lymphadenopathy. PULMONARY:  Unlabored respiration.  He is clear to auscultation bilaterally. CARDIOVASCULAR:  Regular rate and rhythm.  No murmurs, gallops.  He has 2+ radial and femoral pulses bilaterally. ABDOMEN:  Diminished bowel sounds.  Abdomen is soft, flat.  He has mild to moderate lower quadrant abdominal discomfort in both the right and left lower quadrants.  No peritoneal signs.  No hernias or masses are elicited. EXTREMITIES:  Warm and dry.  PERTINENT LABORATORY AND RADIOGRAPHIC STUDIES:  WBC is reported by the emergency room is elevated at 12.  These are outpatient records from his recent transferred from [facility].  Basic metabolic panel, sodium 134, potassium 3.3, potassium chloride 98, bicarb 26, BUN 5, creatinine 0.82.  ASSESSMENT/PLAN:  At this point,  small bowel obstruction versus ileus. Plan at this point is  to continue the patient in n.p.o. status, continue IV fluid hydration.  With his history of the pulmonary embolism, we will resume low therapeutic Lovenox and will continue nasogastric decompression.  Indications for surgical intervention were discussed at length with the patient including, but not limited to the risk of increasing, white blood cell count increasing, abdominal pain, nausea, vomiting as well as persistence of a limited bowel function.  At this point, we will continue conservative management and the patient understands planned course and we will continue to monitor the patient.     [name], MD     BZ/MEDQ  D:  07/03/2010  T:  07/04/2010  Job:   [id number]  Electronically Signed by [name] MD on 07/06/2010 11:24:25 AM

## 2010-07-07 LAB — COMPREHENSIVE METABOLIC PANEL
ALT: 11 U/L (ref 0–53)
AST: 15 U/L (ref 0–37)
Albumin: 3.2 g/dL — ABNORMAL LOW (ref 3.5–5.2)
CO2: 31 mEq/L (ref 19–32)
Calcium: 8.8 mg/dL (ref 8.4–10.5)
GFR calc Af Amer: >60 mL/min (ref >60)
Sodium: 140 mEq/L (ref 135–145)
Total Protein: 5.8 g/dL — ABNORMAL LOW (ref 6.0–8.3)

## 2010-07-07 LAB — CBC
Hemoglobin: 13.6 g/dL (ref 13.0–17.0)
MCHC: 33.1 g/dL (ref 30.0–36.0)
Platelets: 186 10*3/uL (ref 150–400)
RDW: 12.9 % (ref 11.5–15.5)

## 2010-07-08 LAB — MAGNESIUM: Magnesium: 1.5 mg/dL (ref 1.5–2.5)

## 2010-07-08 LAB — COMPREHENSIVE METABOLIC PANEL
ALT: 8 U/L (ref 0–53)
CO2: 29 mEq/L (ref 19–32)
Calcium: 8.6 mg/dL (ref 8.4–10.5)
Chloride: 103 mEq/L (ref 96–112)
Creatinine, Ser: 0.71 mg/dL (ref 0.4–1.5)
GFR calc non Af Amer: >60 mL/min (ref >60)
Glucose, Bld: 99 mg/dL (ref 70–99)
Total Bilirubin: 0.7 mg/dL (ref 0.3–1.2)

## 2010-07-08 LAB — CBC
HCT: 38.8 % — ABNORMAL LOW (ref 39.0–52.0)
Hemoglobin: 13.1 g/dL (ref 13.0–17.0)
MCH: 30.7 pg (ref 26.0–34.0)
MCHC: 33.8 g/dL (ref 30.0–36.0)
RBC: 4.27 MIL/uL (ref 4.22–5.81)

## 2010-07-08 LAB — DIFFERENTIAL
Lymphocytes Relative: 15 % (ref 12–46)
Lymphs Abs: 1.6 10*3/uL (ref 0.7–4.0)
Monocytes Absolute: 1.3 10*3/uL — ABNORMAL HIGH (ref 0.1–1.0)
Monocytes Relative: 12 % (ref 3–12)
Neutro Abs: 7.5 10*3/uL (ref 1.7–7.7)
Neutrophils Relative %: 71 % (ref 43–77)

## 2010-07-11 LAB — PROTIME-INR
INR: 1.02 (ref 0.00–1.49)
Prothrombin Time: 13.6 seconds (ref 11.6–15.2)

## 2010-07-13 ENCOUNTER — Emergency Department (HOSPITAL_COMMUNITY)
Admission: EM | Admit: 2010-07-13 | Discharge: 2010-07-14 | Disposition: A | Discharge status: Home / Self Care | Payer: Federal, State, Local not specified - PPO | Attending: Emergency Medicine | Admitting: Emergency Medicine

## 2010-07-13 ENCOUNTER — Emergency Department (HOSPITAL_COMMUNITY): Payer: Federal, State, Local not specified - PPO

## 2010-07-13 DIAGNOSIS — R109 Unspecified abdominal pain: Secondary | ICD-10-CM | POA: Insufficient documentation

## 2010-07-13 DIAGNOSIS — I10 Essential (primary) hypertension: Secondary | ICD-10-CM | POA: Insufficient documentation

## 2010-07-13 DIAGNOSIS — M1990 Unspecified osteoarthritis, unspecified site: Secondary | ICD-10-CM | POA: Insufficient documentation

## 2010-07-13 DIAGNOSIS — Z86711 Personal history of pulmonary embolism: Secondary | ICD-10-CM | POA: Insufficient documentation

## 2010-07-13 DIAGNOSIS — M810 Age-related osteoporosis without current pathological fracture: Secondary | ICD-10-CM | POA: Insufficient documentation

## 2010-07-13 LAB — COMPREHENSIVE METABOLIC PANEL
ALT: 9 U/L (ref 0–53)
Alkaline Phosphatase: 69 U/L (ref 39–117)
BUN: 5 mg/dL — ABNORMAL LOW (ref 6–23)
CO2: 26 mEq/L (ref 19–32)
Chloride: 105 mEq/L (ref 96–112)
GFR calc non Af Amer: >60 mL/min (ref >60)
Glucose, Bld: 98 mg/dL (ref 70–99)
Potassium: 3.8 mEq/L (ref 3.5–5.1)
Sodium: 138 mEq/L (ref 135–145)
Total Bilirubin: 0.2 mg/dL — ABNORMAL LOW (ref 0.3–1.2)
Total Protein: 5.8 g/dL — ABNORMAL LOW (ref 6.0–8.3)

## 2010-07-13 LAB — CBC
MCV: 90.2 fL (ref 78.0–100.0)
Platelets: 241 10*3/uL (ref 150–400)
RBC: 3.96 MIL/uL — ABNORMAL LOW (ref 4.22–5.81)
RDW: 13.3 % (ref 11.5–15.5)
WBC: 8.4 10*3/uL (ref 4.0–10.5)

## 2010-07-13 LAB — DIFFERENTIAL
Basophils Absolute: 0 10*3/uL (ref 0.0–0.1)
Basophils Relative: 0 % (ref 0–1)
Eosinophils Absolute: 0.1 10*3/uL (ref 0.0–0.7)
Eosinophils Relative: 1 % (ref 0–5)
Lymphs Abs: 2.6 10*3/uL (ref 0.7–4.0)
Neutrophils Relative %: 57 % (ref 43–77)

## 2010-07-13 LAB — URINALYSIS, ROUTINE W REFLEX MICROSCOPIC
Bilirubin Urine: NEGATIVE
Hgb urine dipstick: NEGATIVE
Ketones, ur: NEGATIVE mg/dL
Nitrite: NEGATIVE
Specific Gravity, Urine: 1.01 (ref 1.005–1.030)
pH: 5.5 (ref 5.0–8.0)

## 2010-07-13 LAB — LIPASE, BLOOD: Lipase: 39 U/L (ref 11–59)

## 2010-07-13 MED ORDER — IOHEXOL 300 MG/ML  SOLN
100.0000 mL | Freq: Once | INTRAMUSCULAR | Status: AC | PRN
  Administered 2010-07-13: 100 mL via INTRAVENOUS

## 2010-07-21 DIAGNOSIS — C189 Malignant neoplasm of colon, unspecified: Secondary | ICD-10-CM

## 2010-07-21 HISTORY — DX: Malignant neoplasm of colon, unspecified: C18.9

## 2010-07-21 HISTORY — PX: COLON SURGERY: SHX602

## 2010-07-27 ENCOUNTER — Inpatient Hospital Stay (HOSPITAL_COMMUNITY): Payer: Federal, State, Local not specified - PPO

## 2010-07-27 ENCOUNTER — Inpatient Hospital Stay (HOSPITAL_COMMUNITY)
Admission: AD | Admit: 2010-07-27 | Discharge: 2010-08-18 | DRG: 148 | Disposition: A | Discharge status: Home / Self Care | Payer: Federal, State, Local not specified - PPO | Source: Ambulatory Visit | Attending: Internal Medicine | Admitting: Internal Medicine

## 2010-07-27 DIAGNOSIS — Z681 Body mass index (BMI) 19 or less, adult: Secondary | ICD-10-CM

## 2010-07-27 DIAGNOSIS — E46 Unspecified protein-calorie malnutrition: Secondary | ICD-10-CM | POA: Diagnosis present

## 2010-07-27 DIAGNOSIS — D696 Thrombocytopenia, unspecified: Secondary | ICD-10-CM | POA: Diagnosis not present

## 2010-07-27 DIAGNOSIS — K389 Disease of appendix, unspecified: Secondary | ICD-10-CM | POA: Diagnosis present

## 2010-07-27 DIAGNOSIS — D62 Acute posthemorrhagic anemia: Secondary | ICD-10-CM | POA: Diagnosis not present

## 2010-07-27 DIAGNOSIS — K660 Peritoneal adhesions (postprocedural) (postinfection): Secondary | ICD-10-CM | POA: Diagnosis present

## 2010-07-27 DIAGNOSIS — Y836 Removal of other organ (partial) (total) as the cause of abnormal reaction of the patient, or of later complication, without mention of misadventure at the time of the procedure: Secondary | ICD-10-CM | POA: Diagnosis not present

## 2010-07-27 DIAGNOSIS — E871 Hypo-osmolality and hyponatremia: Secondary | ICD-10-CM | POA: Diagnosis not present

## 2010-07-27 DIAGNOSIS — K929 Disease of digestive system, unspecified: Secondary | ICD-10-CM | POA: Diagnosis not present

## 2010-07-27 DIAGNOSIS — Z86718 Personal history of other venous thrombosis and embolism: Secondary | ICD-10-CM

## 2010-07-27 DIAGNOSIS — F1020 Alcohol dependence, uncomplicated: Secondary | ICD-10-CM | POA: Diagnosis present

## 2010-07-27 DIAGNOSIS — K5669 Other intestinal obstruction: Secondary | ICD-10-CM | POA: Diagnosis present

## 2010-07-27 DIAGNOSIS — C184 Malignant neoplasm of transverse colon: Principal | ICD-10-CM | POA: Diagnosis present

## 2010-07-27 DIAGNOSIS — I10 Essential (primary) hypertension: Secondary | ICD-10-CM | POA: Diagnosis not present

## 2010-07-27 LAB — DIFFERENTIAL
Basophils Absolute: 0 10*3/uL (ref 0.0–0.1)
Blasts: 0 %
Lymphocytes Relative: 24 % (ref 12–46)
Lymphs Abs: 2.1 10*3/uL (ref 0.7–4.0)
Myelocytes: 0 %
Neutro Abs: 5.7 10*3/uL (ref 1.7–7.7)
Neutrophils Relative %: 64 % (ref 43–77)
Promyelocytes Absolute: 0 %
nRBC: 0 /100 WBC

## 2010-07-27 LAB — CBC
HCT: 37.2 % — ABNORMAL LOW (ref 39.0–52.0)
Hemoglobin: 12.6 g/dL — ABNORMAL LOW (ref 13.0–17.0)
MCH: 30.1 pg (ref 26.0–34.0)
MCHC: 33.9 g/dL (ref 30.0–36.0)
MCV: 88.8 fL (ref 78.0–100.0)
RDW: 14.3 % (ref 11.5–15.5)

## 2010-07-27 LAB — COMPREHENSIVE METABOLIC PANEL
ALT: 9 U/L (ref 0–53)
Alkaline Phosphatase: 82 U/L (ref 39–117)
BUN: 8 mg/dL (ref 6–23)
Chloride: 108 mEq/L (ref 96–112)
Glucose, Bld: 97 mg/dL (ref 70–99)
Potassium: 4 mEq/L (ref 3.5–5.1)
Sodium: 141 mEq/L (ref 135–145)
Total Bilirubin: 0.1 mg/dL — ABNORMAL LOW (ref 0.3–1.2)
Total Protein: 6.5 g/dL (ref 6.0–8.3)

## 2010-07-27 LAB — MAGNESIUM: Magnesium: 2 mg/dL (ref 1.5–2.5)

## 2010-07-27 LAB — PROTIME-INR: INR: 2.78 — ABNORMAL HIGH (ref 0.00–1.49)

## 2010-07-28 LAB — DIFFERENTIAL
Basophils Relative: 1 % (ref 0–1)
Eosinophils Absolute: 0.2 10*3/uL (ref 0.0–0.7)
Eosinophils Relative: 3 % (ref 0–5)
Lymphs Abs: 3 10*3/uL (ref 0.7–4.0)
Monocytes Relative: 11 % (ref 3–12)

## 2010-07-28 LAB — BASIC METABOLIC PANEL
BUN: 5 mg/dL — ABNORMAL LOW (ref 6–23)
CO2: 24 mEq/L (ref 19–32)
Calcium: 8.9 mg/dL (ref 8.4–10.5)
Chloride: 107 mEq/L (ref 96–112)
Creatinine, Ser: 0.73 mg/dL (ref 0.4–1.5)

## 2010-07-28 LAB — CBC
MCH: 29.7 pg (ref 26.0–34.0)
MCHC: 32.9 g/dL (ref 30.0–36.0)
MCV: 90.3 fL (ref 78.0–100.0)
Platelets: 239 10*3/uL (ref 150–400)
RDW: 14.5 % (ref 11.5–15.5)

## 2010-07-28 MED ORDER — IOHEXOL 300 MG/ML  SOLN
100.0000 mL | Freq: Once | INTRAMUSCULAR | Status: AC | PRN
  Administered 2010-07-28: 100 mL via INTRAVENOUS

## 2010-07-29 LAB — CBC
Hemoglobin: 11.7 g/dL — ABNORMAL LOW (ref 13.0–17.0)
MCH: 29.7 pg (ref 26.0–34.0)
MCHC: 32.6 g/dL (ref 30.0–36.0)
Platelets: 197 10*3/uL (ref 150–400)
RDW: 14.7 % (ref 11.5–15.5)

## 2010-07-29 LAB — DIFFERENTIAL
Basophils Relative: 0 % (ref 0–1)
Eosinophils Absolute: 0.2 10*3/uL (ref 0.0–0.7)
Eosinophils Relative: 2 % (ref 0–5)
Monocytes Absolute: 0.8 10*3/uL (ref 0.1–1.0)
Monocytes Relative: 9 % (ref 3–12)

## 2010-07-29 LAB — BASIC METABOLIC PANEL
CO2: 25 mEq/L (ref 19–32)
Calcium: 9.1 mg/dL (ref 8.4–10.5)
Glucose, Bld: 88 mg/dL (ref 70–99)
Sodium: 140 mEq/L (ref 135–145)

## 2010-07-29 LAB — PROTIME-INR: Prothrombin Time: 26.6 seconds — ABNORMAL HIGH (ref 11.6–15.2)

## 2010-07-30 LAB — DIFFERENTIAL
Eosinophils Absolute: 0.1 10*3/uL (ref 0.0–0.7)
Lymphs Abs: 2.4 10*3/uL (ref 0.7–4.0)
Monocytes Relative: 11 % (ref 3–12)
Neutrophils Relative %: 68 % (ref 43–77)

## 2010-07-30 LAB — CBC
Hemoglobin: 11.5 g/dL — ABNORMAL LOW (ref 13.0–17.0)
MCH: 29.8 pg (ref 26.0–34.0)
MCV: 90.9 fL (ref 78.0–100.0)
RBC: 3.86 MIL/uL — ABNORMAL LOW (ref 4.22–5.81)

## 2010-07-30 LAB — COMPREHENSIVE METABOLIC PANEL
Alkaline Phosphatase: 81 U/L (ref 39–117)
BUN: 4 mg/dL — ABNORMAL LOW (ref 6–23)
CO2: 27 mEq/L (ref 19–32)
Chloride: 103 mEq/L (ref 96–112)
Glucose, Bld: 88 mg/dL (ref 70–99)
Potassium: 4.2 mEq/L (ref 3.5–5.1)
Total Bilirubin: 0.4 mg/dL (ref 0.3–1.2)

## 2010-07-31 ENCOUNTER — Other Ambulatory Visit: Payer: Self-pay | Admitting: General Surgery

## 2010-07-31 LAB — SURGICAL PCR SCREEN
MRSA, PCR: NEGATIVE
Staphylococcus aureus: NEGATIVE

## 2010-07-31 LAB — DIFFERENTIAL
Basophils Absolute: 0 10*3/uL (ref 0.0–0.1)
Lymphocytes Relative: 22 % (ref 12–46)
Neutro Abs: 5.3 10*3/uL (ref 1.7–7.7)

## 2010-07-31 LAB — CBC
HCT: 35.3 % — ABNORMAL LOW (ref 39.0–52.0)
Hemoglobin: 11.5 g/dL — ABNORMAL LOW (ref 13.0–17.0)
RDW: 14.2 % (ref 11.5–15.5)
WBC: 8.4 10*3/uL (ref 4.0–10.5)

## 2010-07-31 LAB — BASIC METABOLIC PANEL
CO2: 27 mEq/L (ref 19–32)
GFR calc non Af Amer: >60 mL/min (ref >60)
Glucose, Bld: 118 mg/dL — ABNORMAL HIGH (ref 70–99)
Potassium: 4.2 mEq/L (ref 3.5–5.1)
Sodium: 136 mEq/L (ref 135–145)

## 2010-07-31 LAB — APTT: aPTT: 38 seconds — ABNORMAL HIGH (ref 24–37)

## 2010-08-01 LAB — CBC
MCV: 87.9 fL (ref 78.0–100.0)
Platelets: 174 10*3/uL (ref 150–400)
RBC: 4.2 MIL/uL — ABNORMAL LOW (ref 4.22–5.81)
WBC: 15.5 10*3/uL — ABNORMAL HIGH (ref 4.0–10.5)

## 2010-08-01 LAB — BASIC METABOLIC PANEL
BUN: 11 mg/dL (ref 6–23)
Chloride: 98 mEq/L (ref 96–112)
GFR calc Af Amer: >60 mL/min (ref >60)
Potassium: 4.3 mEq/L (ref 3.5–5.1)
Sodium: 134 mEq/L — ABNORMAL LOW (ref 135–145)

## 2010-08-01 LAB — DIFFERENTIAL
Basophils Absolute: 0 10*3/uL (ref 0.0–0.1)
Eosinophils Absolute: 0 10*3/uL (ref 0.0–0.7)
Lymphs Abs: 1 10*3/uL (ref 0.7–4.0)
Neutrophils Relative %: 83 % — ABNORMAL HIGH (ref 43–77)

## 2010-08-01 LAB — MAGNESIUM: Magnesium: 1.6 mg/dL (ref 1.5–2.5)

## 2010-08-01 LAB — PHOSPHORUS: Phosphorus: 4.3 mg/dL (ref 2.3–4.6)

## 2010-08-01 NOTE — H&P (Signed)
NAME:  [name], [name]              ACCOUNT NO.:  [id number]  MEDICAL RECORD NO.:  [id number]           PATIENT TYPE:  I  LOCATION:  A315                          FACILITY:  [facility]  PHYSICIAN:  [name], M.D. DATE OF BIRTH:  05-25-1950  DATE OF ADMISSION:  07/27/2010 DATE OF DISCHARGE:  LH                             HISTORY & PHYSICAL   PRIMARY CARE PHYSICIAN:  [name] Raspberry, MD, [place]  CHIEF COMPLAINT:  Abdominal pain and swelling since yesterday.  HISTORY OF PRESENT ILLNESS:  This is a 60 year old Caucasian gentleman with a history of multiple abdominal surgeries and multiple episodes of small-bowel obstruction who has been relatively healthy from that point of view since 1988.  The patient, however, reports that since January 2012, he started to have recurrent episodes of what he recognizes as small bowel obstruction.  The patient was discharged from [facility] about 2 weeks ago on July 11, 2010 after treatment for small- bowel obstruction which resolved without surgery.  The patient in fact returned to the emergency room on July 14, 2010 with abdominal pain, was evaluated by Dr. Lovell Sheehan, had a CT scan done, and this revealed no evidence of acute nor chronic small bowel obstruction and radiologic findings were discussed between the radiologist, emergency room physician, and Dr. Lovell Sheehan and no significant pathology was confirmed. The patient followed up with his primary care physician and because of his history of recurrent small-bowel obstruction, the patient was told by his primary physician to called him whenever he felt he was having obstruction and he could order x-rays over the phone and assist with his management if possible.  The patient now reports that he has been having abdominal discomfort and abdominal distention since yesterday.  Called his physician who ordered an abdominal x-ray over the phone, got a report of the x-rays suggesting early  small-bowel obstruction and contacted the Hospitalist Service at [facility] for direct admission for small bowel obstruction.  Hence, the patient's arrival to the hospital for direct admission.  The patient was able to eat a meal of hot dogs and french fries about an hour prior to todays X-rays, without untoward effects.  The patient denies any vomiting.  He has been having nausea.  He had a soft mucousy bowel movement 3 days ago.  It contained no blood.  He has been passing gas without any problems and he has passed gas today.  He insists that the abdominal pain is not colicky but just constant and sore.  PAST MEDICAL HISTORY: 1. Chronic alcoholism, abstinent since detox in January 2012. 2. Pulmonary embolism, diagnosed at [facility], March 08, 2010, now completing 6 months of Coumadin. 3. Hypertension. 4. Questionable TIA in January 2012.  PAST SURGICAL HISTORY: 1. 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 2. Between 1982 and 1988, the patient says he has 8 abdominal    surgeries for adhesions related to his earlier surgeries and at     times required intestinal resection.  MEDICATIONS: 1. Coumadin 6 mg daily. 2. Diovan 320 mg  daily. 3. Folic acid daily. 4. Thiamine daily.  ALLERGIES:  No known drug allergies.  SOCIAL HISTORY: 1. Used to be a heavy abuser of alcohol but has discontinued since     January 2012. 2. He smokes half a pack of cigarettes per day. 3. 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  Says he has never     served overseas but has been stationed all around the country     during his time in the [employer].  He does not suffer from post-     traumatic stress disorder.  FAMILY HISTORY:  Positive for coronary artery disease.  REVIEW OF SYSTEMS:  Other than noted above is unremarkable.  PHYSICAL EXAMINATION:  GENERAL:  Pleasant middle-aged Caucasian gentleman lying flat in the stretcher  in no acute distress. VITAL SIGNS:  His temperature is 98.3, his pulse is 85, respirations 20, blood pressure 140/86 and he is saturating at 99% on room air. HEENT:  His pupils are round, equal, and reactive.  Mucous membranes are pink and anicteric. NECK:  No cervical lymphadenopathy.  No thyromegaly.  No carotid bruit. No jugular venous distention. CHEST:  Clear to auscultation bilaterally. CARDIOVASCULAR SYSTEM:  Regular rhythm.  No murmur. ABDOMEN:  Surprisingly soft, nontender.  He does have increased bowel sounds.  There is no flank dullness.  He has one extensive midline scar which he says all his surgeries have been through the same scar. EXTREMITIES:  Without edema.  Dorsalis pedis pulses are 2+ bilaterally. CENTRAL NERVOUS SYSTEM:  Cranial nerves II-XII are grossly intact.  He has no focal lateralizing signs.  LABORATORY DATA:  Because this is a direct admit, there is no hematology or biochemistry available.  However, the patient comes with a report from a plain abdominal film done at about 1:20 today.  The report shows nonspecific bowel gas pattern with gaseous distention of the small and large bowel with associated prominent stool within the region of the colon.  If the patient remains symptomatic, followup study is suggested as an early small-bowel obstruction and cannot be excluded on today's study.  ASSESSMENT: 1. Abdominal bloating, questionable partial small-bowel obstruction.     Please note, obstruction is questionable given the fact that the     patient up to midday today was able to eat hotdogs and french fries     without nausea or vomiting.  He is still passing gas.  There is     evidence of stool in his colon and may be that he does have some     ileus related to mild constipation and may be that he does have a     partial small-bowel obstruction related to his multiple intestinal     surgeries and adhesions. 2. History of pulmonary embolism, on chronic  Coumadin anticoagulation. 3. Hypertension. 4. Chronic alcoholism, abstinent x4 months. 5. Tobacco abuse.  PLAN: 1. We will get basic blood work on this gentleman to assess his renal     function and we will go ahead and get a CT scan of his abdomen and     we will also try after some colon cleansing.  Depending on the     results of the initial investigations will depend on whether this     gentleman can be discharged home tomorrow.  We will not pass an NG     tube at this time, but we will keep him n.p.o. until further     studies are completed. 2. We will order  parenteral antihypertensives and depending on the     results from his INR, he may need supplemental Lovenox because of     his history of PE.  Other plans as per orders.     [name], M.D.     LC/MEDQ  D:  07/27/2010  T:  07/27/2010  Job:  [id number]  cc:   [name] Fax: [phone]  [name], MD Fax: [phone]  Electronically Signed by [name] M.D. on 08/01/2010 12:37:39 AM

## 2010-08-02 ENCOUNTER — Inpatient Hospital Stay (HOSPITAL_COMMUNITY): Payer: Federal, State, Local not specified - PPO

## 2010-08-02 LAB — CBC
HCT: 29 % — ABNORMAL LOW (ref 39.0–52.0)
Hemoglobin: 9.7 g/dL — ABNORMAL LOW (ref 13.0–17.0)
RBC: 3.28 MIL/uL — ABNORMAL LOW (ref 4.22–5.81)
WBC: 17.2 10*3/uL — ABNORMAL HIGH (ref 4.0–10.5)

## 2010-08-02 LAB — DIFFERENTIAL
Basophils Absolute: 0 10*3/uL (ref 0.0–0.1)
Lymphocytes Relative: 4 % — ABNORMAL LOW (ref 12–46)
Neutro Abs: 14.7 10*3/uL — ABNORMAL HIGH (ref 1.7–7.7)
Neutrophils Relative %: 86 % — ABNORMAL HIGH (ref 43–77)

## 2010-08-02 LAB — URINALYSIS, ROUTINE W REFLEX MICROSCOPIC
Glucose, UA: NEGATIVE mg/dL
Nitrite: NEGATIVE
Protein, ur: NEGATIVE mg/dL
pH: 7 (ref 5.0–8.0)

## 2010-08-02 LAB — BASIC METABOLIC PANEL
CO2: 30 mEq/L (ref 19–32)
GFR calc Af Amer: >60 mL/min (ref >60)
Glucose, Bld: 102 mg/dL — ABNORMAL HIGH (ref 70–99)
Potassium: 4 mEq/L (ref 3.5–5.1)
Sodium: 132 mEq/L — ABNORMAL LOW (ref 135–145)

## 2010-08-02 LAB — PHOSPHORUS: Phosphorus: 2.9 mg/dL (ref 2.3–4.6)

## 2010-08-03 LAB — CBC
MCV: 88.1 fL (ref 78.0–100.0)
Platelets: 143 10*3/uL — ABNORMAL LOW (ref 150–400)
RBC: 2.95 MIL/uL — ABNORMAL LOW (ref 4.22–5.81)
RDW: 14.2 % (ref 11.5–15.5)
WBC: 14.8 10*3/uL — ABNORMAL HIGH (ref 4.0–10.5)

## 2010-08-03 LAB — HEMOGLOBIN AND HEMATOCRIT, BLOOD
HCT: 25.5 % — ABNORMAL LOW (ref 39.0–52.0)
Hemoglobin: 8.4 g/dL — ABNORMAL LOW (ref 13.0–17.0)

## 2010-08-03 LAB — COMPREHENSIVE METABOLIC PANEL
Calcium: 8.7 mg/dL (ref 8.4–10.5)
Chloride: 93 mEq/L — ABNORMAL LOW (ref 96–112)
Creatinine, Ser: 0.6 mg/dL (ref 0.4–1.5)
GFR calc Af Amer: >60 mL/min (ref >60)
GFR calc non Af Amer: >60 mL/min (ref >60)
Glucose, Bld: 92 mg/dL (ref 70–99)
Potassium: 3.5 mEq/L (ref 3.5–5.1)
Sodium: 131 mEq/L — ABNORMAL LOW (ref 135–145)

## 2010-08-03 LAB — DIFFERENTIAL
Basophils Absolute: 0 10*3/uL (ref 0.0–0.1)
Eosinophils Absolute: 0 10*3/uL (ref 0.0–0.7)
Eosinophils Relative: 0 % (ref 0–5)
Lymphs Abs: 1.4 10*3/uL (ref 0.7–4.0)
Neutrophils Relative %: 78 % — ABNORMAL HIGH (ref 43–77)

## 2010-08-03 LAB — MAGNESIUM: Magnesium: 1.9 mg/dL (ref 1.5–2.5)

## 2010-08-04 ENCOUNTER — Inpatient Hospital Stay (HOSPITAL_COMMUNITY): Payer: Federal, State, Local not specified - PPO

## 2010-08-04 DIAGNOSIS — C189 Malignant neoplasm of colon, unspecified: Secondary | ICD-10-CM

## 2010-08-04 LAB — BASIC METABOLIC PANEL
BUN: 7 mg/dL (ref 6–23)
Creatinine, Ser: 0.55 mg/dL (ref 0.4–1.5)
GFR calc non Af Amer: >60 mL/min (ref >60)

## 2010-08-04 LAB — CBC
HCT: 25.4 % — ABNORMAL LOW (ref 39.0–52.0)
Hemoglobin: 8.5 g/dL — ABNORMAL LOW (ref 13.0–17.0)
MCH: 29.5 pg (ref 26.0–34.0)
RBC: 2.88 MIL/uL — ABNORMAL LOW (ref 4.22–5.81)

## 2010-08-04 LAB — URINE CULTURE
Colony Count: NO GROWTH
Culture: NO GROWTH

## 2010-08-04 LAB — DIFFERENTIAL
Basophils Relative: 0 % (ref 0–1)
Lymphocytes Relative: 12 % (ref 12–46)
Lymphs Abs: 1.5 10*3/uL (ref 0.7–4.0)
Monocytes Relative: 13 % — ABNORMAL HIGH (ref 3–12)
Neutro Abs: 9 10*3/uL — ABNORMAL HIGH (ref 1.7–7.7)
Neutrophils Relative %: 75 % (ref 43–77)

## 2010-08-04 NOTE — Group Therapy Note (Signed)
NAME:  [name], [name]              ACCOUNT NO.:  [id number]  MEDICAL RECORD NO.:  [id number]           PATIENT TYPE:  I  LOCATION:  A315                          FACILITY:  [facility]  PHYSICIAN:  Hillery Aldo, M.D.   DATE OF BIRTH:  Aug 03, 1950  DATE OF PROCEDURE: DATE OF DISCHARGE:                                PROGRESS NOTE   PRIMARY CARE PHYSICIAN:  [name] in [place], [place].  CURRENT DIAGNOSES: 1. Newly diagnosed adenocarcinoma of the colon, status post left     hemicolectomy. 2. Partial small-bowel obstruction caused by colonic mass. 3. History of pulmonary embolism. 4. Acute blood loss anemia, status post left hemicolectomy. 5. Postoperative accelerated hypertension. 6. Leukocytosis. 7. Mild thrombocytopenia, resolved. 8. Tobacco abuse. 9. Hypoproteinemia/hypoalbuminemia secondary to protein-calorie     malnutrition. 10.Mild hyponatremia.  DISCHARGE MEDICATIONS:  Be dictated at the time of actual discharge.  CONSULTATIONS: 1. [name], MD of General Surgery. 2. Oncology consult, pending.  BRIEF ADMISSION HISTORY OF PRESENT ILLNESS:  The patient is a 60 year old male with past medical history of multiple episodes of small bowel obstruction and multiple abdominal surgeries.  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  Between 1982 and 1988, he reported having a total of eight abdominal surgeries for adhesions related to his earlier surgeries.  He presented to the hospital with abdominal pain and bloating and was felt to have a partial small-bowel obstruction.  He was referred to the hospitalist service for further evaluation.  For the full details, please see the dictated report done by Dr. Orvan Falconer.  PROCEDURES AND DIAGNOSTIC STUDIES: 1. 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  No free air or fluid.  No inflammatory     changes.  Prostatic enlargement. 2. Exploratory laparotomy, left hemicolectomy with primary colonic     anastomosis, excision of small bowel mesenteric mass, and extensive     lysis of adhesions performed by Dr. Leticia Penna on Jul 31, 2010. 3. Chest x-ray on Aug 02, 2010, showed low lung volumes with streaky     bibasilar atelectasis.  Free intra-abdominal air due to recent     surgery.  DISCHARGE LABORATORY VALUES:  Be dictated at the time of actual discharge.  HOSPITAL COURSE: 1. Adenocarcinoma of the colon:  The patient was admitted with a     partial small-bowel obstruction and a diagnostic evaluation was     performed.  CT scan results did not reveal any specific etiology,     although there was a question of a mesenteric volvulus raised.  The     patient was followed by Dr. Leticia Penna of General Surgery.  When     conservative therapy including  bowel rest failed to relieve his     symptoms, he ultimately underwent an exploratory laparoscopy with     the findings as noted above.  The colonic mass was resected and     pathology ultimately confirmed adenocarcinoma.  At this point, the     plan is to obtain an Oncology consultation for further treatment     recommendations. 2. Partial small-bowel obstruction:  The patient's partial small-bowel     obstruction was due to the colonic mass.  The mass has been     resected.  The patient continues to have difficulty with p.o.     intake and is slow to progress postoperatively.  We continue to     monitor him closely. 3. History of pulmonary embolism:  The patient was on Coumadin prior     to admission.  He was switched over to Lovenox in anticipation of     surgery.  The  patient was taken to surgery when his INR was     effectively within the normal range.  Given his active malignancy,     the patient will be treated with ongoing Lovenox therapy. 4. Acute blood loss anemia, status post hemicolectomy:  The patient     has had a 3.5 grams drop in his hemoglobin over baseline values     postoperatively.  Over the past 24 hours, his hemoglobin and     hematocrit have stabilized to 8.5/25.4.  He has not required blood     transfusion.  However, we continue to monitor his hemoglobin and     hematocrit closely. 5. Postoperative accelerated hypertension:  The patient did develop     marked elevation of his blood pressure postoperatively with a blood     pressure reported at 179/120.  He was started on IV metoprolol and     this brought his blood pressure and pulse rate down.  Ultimately,     his Diovan was resumed and at this point, his metoprolol is being     switched over to p.o. dosing for ongoing blood pressure control. 6. Leukocytosis:  The patient did develop postoperative leukocytosis.     A chest x-ray was clear, and he did not have any evidence of     pneumonia.  He was put on empiric Zosyn therapy by the surgeons and     his white blood cell count is beginning to normalize. 7. Mild thrombocytopenia:  This issue resolved. 8. Tobacco abuse:  The patient has continued on nicotine patch. 9. Hypoproteinemia/hypoalbuminemia:  This is felt to be due to protein-     calorie malnutrition.  The patient has not had any significant p.o.     intake since his hospital stay.  Accordingly, we are going to place     a PICC line and start him on total nutritional admixture as it is     anticipated that he will continue to have difficulty with p.o.     intake for the next several days. 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     At this point, unless this issue worsens,  we would not pursue a     further diagnostic evaluation.  DISPOSITION:  The patient is not yet medically stable for discharge.  We are waiting Oncology consultation.  His nutritional status is extremely poor, and he will require total nutritional admixture and parenteral nutrition until he  improves his p.o. intake.  A discharge summary addendum will be dictated at the time of actual discharge.     Hillery Aldo, M.D.     CR/MEDQ  D:  08/04/2010  T:  08/04/2010  Job:  [id number]  cc:   [name] Fax: [phone]  Electronically Signed by Hillery Aldo M.D. on 08/04/2010 04:43:24 PM

## 2010-08-04 NOTE — Group Therapy Note (Signed)
  NAMEUNNAMED, ZEIEN              ACCOUNT NO.:  [id number]  MEDICAL RECORD NO.:  [id number]           PATIENT TYPE:  I  LOCATION:  A315                          FACILITY:  [facility]  PHYSICIAN:  [name] Singer, M.D.DATE OF BIRTH:  [birth year]  DATE OF PROCEDURE:  07/28/2010 DATE OF DISCHARGE:                                PROGRESS NOTE   SUBJECTIVE:  This man was admitted yesterday with abdominal pain and swelling for approximately 1 day.  He has had episodes of small bowel obstruction previously and recently was hospitalized in the middle of April just about a month ago.  He has had multiple abdominal surgeries and it appears that surgeons are un-keen to perform further surgeries because of the anatomy and adhesions.  Dr. Orvan Falconer, my partner did a CT scan of the abdomen yesterday and this showed that there was no evidence of definite bowel obstruction, but there was edema and infiltration in the mesentry and a mesenteric cystic structure previously demonstrated on the left side is down the right side suggesting the possibility of mesenteric volvulus.  The patient himself still complains of abdominal pain, although his abdomen does not appear to be particularly distended.  OBJECTIVE:  VITAL SIGNS:  Temperature 98, blood pressure 121/76, pulse 77, saturation 98% on room air. HEART:  Sounds are present and normal. LUNG:  Fields are clear. ABDOMEN:  Soft and slightly tender in generalized fashion.  Bowel sounds are heard.  There is no hepatosplenomegaly.  INVESTIGATIONS:  Hemoglobin 12.5, white blood cell count 8.4, platelets 234.  Magnesium normal at 1.9, sodium 139, potassium 4.5, bicarbonate 24, BUN 5, creatinine 0.73.  Liver enzymes are all normal.  His INR is elevated at 2.78 because he is on Coumadin due to history of pulmonary embolism.  IMPRESSION: 1. 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 2. History of pulmonary embolism. 3. Hypertension.  PLAN: 1. We will ask the  surgeons. 2. Surgical consult. 3. Continue conservative management with intravenous fluids and     analgesics.     [name] Singer, M.D.     NCG/MEDQ  D:  07/28/2010  T:  07/28/2010  Job:  [id number]  Electronically Signed by Lilly Cove M.D. on 08/04/2010 08:18:15 AM

## 2010-08-05 DIAGNOSIS — C189 Malignant neoplasm of colon, unspecified: Secondary | ICD-10-CM

## 2010-08-05 LAB — TRIGLYCERIDES: Triglycerides: 139 mg/dL (ref <150)

## 2010-08-05 LAB — COMPREHENSIVE METABOLIC PANEL
Alkaline Phosphatase: 59 U/L (ref 39–117)
BUN: 6 mg/dL (ref 6–23)
Chloride: 95 mEq/L — ABNORMAL LOW (ref 96–112)
Creatinine, Ser: 0.61 mg/dL (ref 0.4–1.5)
Glucose, Bld: 132 mg/dL — ABNORMAL HIGH (ref 70–99)
Potassium: 3 mEq/L — ABNORMAL LOW (ref 3.5–5.1)
Total Bilirubin: 0.3 mg/dL (ref 0.3–1.2)
Total Protein: 5.8 g/dL — ABNORMAL LOW (ref 6.0–8.3)

## 2010-08-05 LAB — CBC
Platelets: 219 10*3/uL (ref 150–400)
RDW: 13.9 % (ref 11.5–15.5)
WBC: 8.9 10*3/uL (ref 4.0–10.5)

## 2010-08-05 LAB — GLUCOSE, CAPILLARY
Glucose-Capillary: 115 mg/dL — ABNORMAL HIGH (ref 70–99)
Glucose-Capillary: 151 mg/dL — ABNORMAL HIGH (ref 70–99)
Glucose-Capillary: 157 mg/dL — ABNORMAL HIGH (ref 70–99)

## 2010-08-05 LAB — DIFFERENTIAL
Basophils Absolute: 0 10*3/uL (ref 0.0–0.1)
Basophils Relative: 0 % (ref 0–1)
Eosinophils Absolute: 0.2 10*3/uL (ref 0.0–0.7)
Eosinophils Relative: 2 % (ref 0–5)

## 2010-08-05 LAB — MAGNESIUM: Magnesium: 1.9 mg/dL (ref 1.5–2.5)

## 2010-08-05 LAB — CHOLESTEROL, TOTAL: Cholesterol: 158 mg/dL (ref 0–200)

## 2010-08-05 LAB — PHOSPHORUS: Phosphorus: 3.2 mg/dL (ref 2.3–4.6)

## 2010-08-05 NOTE — Op Note (Signed)
[name], [name]              ACCOUNT NO.:  [id number]  MEDICAL RECORD NO.:  [id number]           PATIENT TYPE:  I  LOCATION:  A315                          FACILITY:  [facility]  PHYSICIAN:  [name], MD      DATE OF BIRTH:  10-03-1950  DATE OF PROCEDURE:  07/31/2010 DATE OF DISCHARGE:                              OPERATIVE REPORT   PREOPERATIVE DIAGNOSIS:  Partial small bowel obstruction and a mesenteric mass.  POSTOPERATIVE DIAGNOSES: 1. Transverse colon mass. 2. Small bowel mesenteric mass.  PROCEDURES: 1. Exploratory laparotomy. 2. Left hemicolectomy with primary colonic anastomoses. 3. Excision of a small bowel mesenteric mass. 4. Extensive lysis of adhesions.  SURGEON:  [name], MD  ANESTHESIA:  General endotracheal.  SPECIMEN: 1. Left colon. 2. Mesenteric mass.  ESTIMATED BLOOD LOSS:  250 mL.  FLUID:  1900 crystalloid.  URINE OUTPUT:  1000 mL.  INDICATIONS:  The patient is a 60 year old male who presented to [facility] with nausea, vomiting, abdominal distention, and abdominal pain.  He had had similar symptomatology on 2 prior admissions over the last 3 or 4 months as he has had evidence of recurrence of a bowel obstruction.  Surgical options were discussed with the patient.  The patient does have extensive history of surgical intervention for bowel obstruction in a prior partial gastrectomy for a benign tumor, although the patient was unsure of the exact etiology for diagnosis at that time. Additionally on his workup, there was noted to be a round, cystic- appearing tumor on his small bowel mesentery adjacent to the SMA and SMV.  The risks, benefits, and alternatives of the exploratory laparotomy, possible bowel resection, possible ostomy were discussed at length with the patient.  His questions and concerns were addressed. The patient was consented for the planned procedure.  OPERATION:  The patient was taken to the operating room,  was placed in the supine position on the operating room table, at which time general anesthetic was administered.  Once the patient was asleep, he was endotracheally intubated by Anesthesia.  At this point, a Foley catheter was placed in standard sterile fashion by the operative team and his abdomen was prepped with DuraPrep solution.  Drapes were placed in standard fashion.  An upper midline incision was created with scalpel excising the previous midline incisional scar.  This was carried out with a right lateral keyhole defect around the umbilicus.  At this time, additional dissection done through the subcutaneous tissue including the anterior fascia carried out using electrocautery.  Kocher clamps were utilized to grasp the fascia and lifted anteriorly.  Sharp anterior dissection of the GI peritoneum is __________ using the Metzenbaum scissors.  At this point, the incision was opened both superiorly and inferiorly.  There are several wispy adhesions initially encountered with adherence of small bowel to the anterior abdominal wall.  These were easily taken down.  The transverse colon on the other hand is noted to be firmly attached to the anterior abdominal wall and this was carefully dissected free.  The small bowel was run through its entirety with no evidence of any transition point or strictures.  The small bowel looked healthy.  The mass is palpable within the mesentery of the small bowel adjacent to the SMA, not pulsatile, firm and rubbery.  It is symmetrical, approximately 4 cm on palpation.  Additionally during the dissection, I had felt a suspicious area consistent with mass in the transverse colon near the splenic flexure.  Therefore at this time, I did opt to proceed with a left hemicolectomy as the area was extremely suspicious for a tumor.  The large intestine was divided both proximally and distally with a 75 GIA stapler.  Due to the extreme redundancy of his colon, the  distal sigmoid colon was easily able to reach up to the proximal portion of the transverse colon in a side-to-side fashion.  The mesocolon was divided using an Enseal Harmonic device. Upon completing a high ligation of the mesocolon, the colon was freed and was placed on the back table as a permanent specimen.  At this time, I performed a side-to-side anastomoses with the colon again using the GIA stapler to create a staple anastomoses after placing a pexing suture between the 2 ends of the colon.  The remaining colonic defect was closed using interrupted silks.  The apex of the anastomosis was also reinforced with an additional silk suture.  I did lie the colon at this point, still I was able to lie along the left lateral aspect of the abdominal cavity. I did place a couple of pexing sutures through the mesocolon and the retroperitoneum to close the resulting defect behind the colon to minimize potential for internal hernia.  I was quite pleased with the appearance of the anastomoses and at this time, I turned my attention to the mesenteric mass.  Once carefully scoring the peritoneal reflection of the mesentery, I dissected circumferentially around the palpable mass.  This was carried out using a combination of blunt dissection as well as careful Metzenbaum scissor dissection.  The SMA and SMV were noted to be just left and lateral of the palpable mass.  Several surgical clips were utilized to ligate the blood supply to the tumor.  They were initially utilized for future marking for identification radiologically of the area.  By freeing the tumor, was placed in the back table and was sent as separate specimen to pathology.  At this time, I was quite pleased with the appearance of the abdominal cavity.  Hemostasis was excellent. I did close the peritoneal reflection on the mesentery.  I then irrigated the abdominal cavity with copious amount of sterile saline. The bowel returned to its  proper orientation.  A piece of Seprafilm was placed over the bowel against the anterior abdominal wall before closure and then a Novafil x2 was utilized to reapproximate the fascia in a running continuous fashion.  The two ends of the suture were secured to each other at the midpoint of the incision and tucked and buried underneath the incision.  The skin edges were reapproximated with skin staples.  Skin was washed and dried with dry towel.  Sterile dressing was placed.  Drapes removed.  Dressing was secured.  The patient was allowed to come out of general anesthetic, was transferred back to regular hospital bed in stable condition.  At the conclusion of procedure, all instrument, sponge, and needle counts were correct.  The patient tolerated the procedure extremely well.  I did open the colonic specimen on the back table at the conclusion of the procedure and did identify an approximate 1.5 to 2 cm round, flat,  neoplastic mass, highly suspicious grossly of a colon carcinoma, again this was sent as a permanent specimen to pathology.     [name], MD     BZ/MEDQ  D:  08/01/2010  T:  08/01/2010  Job:  [id number]  cc:   [facility]  Electronically Signed by [name] MD on 08/05/2010 10:47:56 AM

## 2010-08-05 NOTE — Consult Note (Signed)
[name], [name]              ACCOUNT NO.:  [id number]  MEDICAL RECORD NO.:  [id number]           PATIENT TYPE:  I  LOCATION:  A315                          FACILITY:  [facility]  PHYSICIAN:  [name], MD      DATE OF BIRTH:  Oct 16, 1950  DATE OF CONSULTATION:  07/28/2010 DATE OF DISCHARGE:                                CONSULTATION   CHIEF COMPLAINT:  Nausea, abdominal pain, constipation.  HISTORY OF PRESENT ILLNESS:  The patient is a 60 year old male well known to me with a history of hypertension and recent pulmonary embolism and current anticoagulation who presents to the [facility] with increasing episodes of abdominal pain and swelling.  The patient is well known to me and actually had presented several months ago with a similar etiology.  He was treated conservatively at that time, improved and was discharged.  He actually followed up the month following was again an exacerbation of his symptomatology and was seen by my partner, Dr. Lovell Sheehan again with evidence of early small bowel obstruction versus a partial small-bowel obstruction.  Again, he was treated conservatively and was able to be discharge.  Again, he returns again with similar symptomatology.  He denies any significant change in bowel movements other than decreased output over the last 4 days.  He has not noticed any melena, no hematochezia, no diarrhea.  He has had no change in urination.  No hematuria.  No dysuria.  He has had some nausea, but no episodes of emesis.  No fevers and chills.  He has had some mild-to- moderate diffuse crampy abdominal pain.  This has improved since hospitalization.  He has had no significant weight changes.  PAST MEDICAL HISTORY: 1. History of alcohol abuse.  No alcohol use since January of this     year, no history of DTs. 2. Pulmonary embolism diagnosed in December 2011, currently on     anticoagulation. 3. Hypertension. 4. Questionable previous TIA.  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  He has also had multiple exploratory laparotomies for lysis of adhesions and had previous small bowel resection, although the patient is unsure of the amount of small bowel removed.  MEDICATIONS:  Coumadin, Diovan, folic acid, thiamine.  He is currently on therapeutic Lovenox and Coumadin, it is currently on hold.  ALLERGIES:  No known drug allergies.  SOCIAL HISTORY:  He is a half a pack per day smoker.  He has had no current alcohol use, but prior heavy alcohol use.  He denies any recreational drug abuse.  FAMILY HISTORY:  Positive for coronary artery disease, otherwise unremarkable.  PHYSICAL EXAMINATION:  VITAL SIGNS:  Temperature 98.5, heart rate 78, respiratory rate 20, blood pressure 118/70.  He is 97% O2 saturation on room air. GENERAL:  The patient is not in any acute distress.  He is in the seated position in his hospital bed.  He is alert and oriented x3. HEENT:  Scalp no deformities, no masses.  Pupils are equal, round, and reactive.  Extraocular movements are intact.  No scleral icterus or conjunctival pallor.  Oral mucosa is pink.  Normal occlusion. NECK:  Trachea is midline.  No cervical lymphadenopathy. PULMONARY:  Unlabored respirations.  He is clear to auscultation bilaterally. CARDIOVASCULAR:  Regular rate and rhythm.  No murmurs, no gallops.  He has 2+ radial and dorsalis pedis pulses bilaterally. ABDOMEN:  Positive, but diminished bowel sounds.  Abdomen is soft, flat, mild tenderness to deep palpation, but no peritoneal signs.  No rebound. No guarding.  No masses are apparent.  He does have a midline incisional scar.  No hernias. EXTREMITIES:  Warm and dry.  PERTINENT LABORATORY AND RADIOGRAPHIC STUDIES:  CBC; white blood cell count 8.4, hemoglobin 12.5, hematocrit 38.0,  platelets 239.  Basic metabolic panel; sodium 139, potassium 4.5, chloride 107, bicarb 24, BUN 5, creatinine 0.73, blood glucose 82.  CT of the abdomen and pelvis demonstrates no evidence any free air or free fluid.  There is no significant bowel distention, but a cystic structure noted within the small bowel mesentery, this is also noted on the film back in April, but it was not seen on prior studies of the abdomen and pelvis.  ASSESSMENT AND PLAN: 1. Small bowel obstruction. 2. Mesenteric cystic structure.  Plan at this time, continue the     patient in n.p.o. status, continue bowel rest.  I discussed with     the patient placement of a nasogastric tube only if his     symptomatology worsens or should his nausea become uncontrolled.     Based on the findings, my suspicion is potential recurrence of a     benign tumor.  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  We will obtain tumor markers to ensure that this was     not a malignant process.  Additionally as I have discussed with the     patient as he has failed conservative treatment over the last     several months and again returns with recurrence of a     symptomatology, I do feel that exploration is warranted.  However     due to the patient's anticoagulation and stable condition, we will     proceed upon normalization of this INR and discontinuation of his     Lovenox.  The patient understands and at this point, we will     continue the patient again in n.p.o. status, continue IV fluid     hydration, continue the therapeutic Lovenox until the INR is     subtherapeutic.  At which time, we will proceed to the operating     room for an exploration, possible bowel resection.  The patient     understands the current treatment strategy.     [name],  MD     BZ/MEDQ  D:  07/29/2010  T:  07/29/2010  Job:  [id number]  Electronically Signed by [name] MD on 08/05/2010 10:47:19 AM

## 2010-08-06 DIAGNOSIS — C189 Malignant neoplasm of colon, unspecified: Secondary | ICD-10-CM

## 2010-08-06 LAB — COMPREHENSIVE METABOLIC PANEL
Alkaline Phosphatase: 46 U/L (ref 39–117)
BUN: 9 mg/dL (ref 6–23)
CO2: 29 mEq/L (ref 19–32)
Chloride: 98 mEq/L (ref 96–112)
GFR calc non Af Amer: >60 mL/min (ref >60)
Glucose, Bld: 121 mg/dL — ABNORMAL HIGH (ref 70–99)
Potassium: 3.4 mEq/L — ABNORMAL LOW (ref 3.5–5.1)
Total Bilirubin: 0.2 mg/dL — ABNORMAL LOW (ref 0.3–1.2)
Total Protein: 5.4 g/dL — ABNORMAL LOW (ref 6.0–8.3)

## 2010-08-06 LAB — CBC
HCT: 25.2 % — ABNORMAL LOW (ref 39.0–52.0)
MCV: 87.8 fL (ref 78.0–100.0)
RDW: 14 % (ref 11.5–15.5)
WBC: 9.4 10*3/uL (ref 4.0–10.5)

## 2010-08-06 LAB — GLUCOSE, CAPILLARY

## 2010-08-06 LAB — DIFFERENTIAL
Eosinophils Relative: 4 % (ref 0–5)
Lymphocytes Relative: 19 % (ref 12–46)
Lymphs Abs: 1.8 10*3/uL (ref 0.7–4.0)

## 2010-08-06 LAB — MAGNESIUM: Magnesium: 1.9 mg/dL (ref 1.5–2.5)

## 2010-08-06 LAB — PHOSPHORUS: Phosphorus: 3.9 mg/dL (ref 2.3–4.6)

## 2010-08-06 NOTE — Group Therapy Note (Signed)
  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  He also had partial small-bowel obstruction caused by colonic mass.  The pathology shows that 1/18 lymph nodes are involved and this makes him as stage IIIB.  He has been slow to progress with p.o. intake and he is currently on clear fluids.  He has now got intravenous TPN for nutrition.  He otherwise feels well.  PHYSICAL EXAMINATION:  VITAL SIGNS:  Temperature 98.7, blood pressure 145/92, pulse 79, saturation 97% on room air. CARDIOVASCULAR:  Heart sounds are present and normal. LUNGS:  Lung fields are clear. ABDOMEN:  Soft, mildly tender, and has a laparotomy scar, which does not look inflamed around it.  Investigations today show hemoglobin of 9.0 which is stable, in fact increased from 8.5 yesterday, white blood cell count down to 8.9 from 12.0 yesterday, and a platelet count of 219.  Sodium 134, potassium down to 3.0, bicarbonate 32, BUN 6, creatinine 0.61, albumin 2.2.  Magnesium and phosphorus are in the normal range.  IMPRESSION: 1. 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 2. Partial small bowel obstruction caused by colonic mass. 3. History of pulmonary embolism. 4. Anemia, probably blood loss versus chronic disease. 5. History of hypertension now better controlled. 6. Protein-calorie malnutrition.  PLAN: 1. Continue with current treatment and encourage p.o. intake.     Continue antibiotics for the time being.  He  has     not passed gas, but bowel sounds are present.  We will advance diet     as tolerated and per Surgery recommendations. 2. Await Dr. Thornton Papas input from Oncology as to specific     chemotherapeutic agents if this is warranted.     [name] Singer, M.D.     NCG/MEDQ  D:  08/05/2010  T:  08/05/2010  Job:  [id number]  Electronically Signed by Lilly Cove M.D. on 08/06/2010 11:02:13 AM

## 2010-08-07 DIAGNOSIS — C189 Malignant neoplasm of colon, unspecified: Secondary | ICD-10-CM

## 2010-08-07 LAB — GLUCOSE, CAPILLARY
Glucose-Capillary: 128 mg/dL — ABNORMAL HIGH (ref 70–99)
Glucose-Capillary: 131 mg/dL — ABNORMAL HIGH (ref 70–99)

## 2010-08-07 LAB — DIFFERENTIAL
Basophils Relative: 0 % (ref 0–1)
Eosinophils Relative: 4 % (ref 0–5)
Lymphocytes Relative: 16 % (ref 12–46)
Monocytes Relative: 10 % (ref 3–12)
Neutro Abs: 8.5 10*3/uL — ABNORMAL HIGH (ref 1.7–7.7)
Neutrophils Relative %: 70 % (ref 43–77)

## 2010-08-07 LAB — IRON AND TIBC
Iron: 16 ug/dL — ABNORMAL LOW (ref 42–135)
Saturation Ratios: 6 % — ABNORMAL LOW (ref 20–55)
TIBC: 250 ug/dL (ref 215–435)
UIBC: 234 ug/dL

## 2010-08-07 LAB — COMPREHENSIVE METABOLIC PANEL
ALT: 7 U/L (ref 0–53)
AST: 11 U/L (ref 0–37)
Albumin: 2.2 g/dL — ABNORMAL LOW (ref 3.5–5.2)
Calcium: 9 mg/dL (ref 8.4–10.5)
Creatinine, Ser: 0.65 mg/dL (ref 0.4–1.5)
GFR calc Af Amer: >60 mL/min (ref >60)
Sodium: 132 mEq/L — ABNORMAL LOW (ref 135–145)

## 2010-08-07 LAB — CBC
HCT: 27.2 % — ABNORMAL LOW (ref 39.0–52.0)
Hemoglobin: 9.4 g/dL — ABNORMAL LOW (ref 13.0–17.0)
MCH: 30.6 pg (ref 26.0–34.0)
MCV: 88.6 fL (ref 78.0–100.0)
RBC: 3.07 MIL/uL — ABNORMAL LOW (ref 4.22–5.81)

## 2010-08-07 NOTE — Group Therapy Note (Signed)
  NAME:  [name], [name]              ACCOUNT NO.:  [id number]  MEDICAL RECORD NO.:  [id number]           PATIENT TYPE:  I  LOCATION:  A315                          FACILITY:  [facility]  PHYSICIAN:  [name] Singer, M.D.DATE OF BIRTH:  November 10, 1950  DATE OF PROCEDURE:  08/07/2010 DATE OF DISCHARGE:                                PROGRESS NOTE   This man is actually improving, although albeit slowly.  He denies any flatus.  PHYSICAL EXAMINATION:  VITAL SIGNS:  Temperature 98.3, blood pressure 111/73, pulse 79, saturation 98% on room air. HEART:  Heart sounds are present and normal. LUNGS:  Lung fields are clear. ABDOMEN:  Soft and slightly tender and sore.  INVESTIGATIONS:  Hemoglobin 9.4, white blood cell count 12.2 which is an increase from yesterday, platelets 320.  Sodium 132, potassium 3.7, bicarbonate 29, BUN 11, creatinine 0.65.  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  PLAN: 1. We will continue on current treatment with TPN and intravenous     antibiotics. 2. He made his CT scan of the abdomen if he gets worse again, although     now he appears to be improving. 3. Oncology has recommended treatment and the patient has agreed to     have treatment as an outpatient in about 2 weeks' time.     [name] Singer, M.D.     NCG/MEDQ  D:  08/07/2010  T:  08/07/2010  Job:  [id number]  Electronically Signed by Lilly Cove M.D. on 08/07/2010 10:13:23 AM

## 2010-08-07 NOTE — Group Therapy Note (Signed)
  NAME:  [name], [name]              ACCOUNT NO.:  [id number]  MEDICAL RECORD NO.:  [id number]           PATIENT TYPE:  I  LOCATION:  A315                          FACILITY:  [facility]  PHYSICIAN:  [name] Singer, M.D.DATE OF BIRTH:  06-15-1950  DATE OF PROCEDURE: DATE OF DISCHARGE:                                PROGRESS NOTE   HISTORY OF PRESENT ILLNESS:  This man is about the same as he was yesterday with some soreness in his abdomen and not really much of an appetite.  He has not passed flatus, although his bowel sounds were quite active yesterday.  He has certainly not had a bowel motion.  He is now 3 days postoperatively and therefore it maybe a little bit too early.  PHYSICAL EXAMINATION:  VITAL SIGNS:  Temperature 98.6, blood pressure 110/79, pulse 77, saturation 97% on room air. CARDIAC:  Heart sounds are present and normal. LUNGS:  Lung fields are clear with no evidence of developing atelectasis or pneumonia. ABDOMEN:  Soft and slightly sore but bowel sounds are present.  INVESTIGATIONS:  Today sodium 134, potassium 3.4, bicarbonate 29, BUN 9, creatinine 0.55, albumin is down to 2.1, hemoglobin 8.4, white blood cell count 9.4, platelets 247,000.  Magnesium and phosphorus are in the normal range.  IMPRESSION: 1. 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 2. Partial small-bowel obstruction caused by colonic mass. 3. History of pulmonary embolism on Lovenox now. 4. Anemia likely related to blood loss. 5. Hypertension controlled. 6. Protein-calorie malnutrition.  PLAN: 1. We will continue with his current regimen including TPN. 2. Oncology.  We will treat this patient and we will see him as an     outpatient for embarking on chemotherapy. 3. Await Dr. Illene Regulus input regarding when the patient should safely     start a diet.     [name] Singer, M.D.     NCG/MEDQ  D:  08/06/2010  T:  08/06/2010  Job:   [id number]  Electronically Signed by Lilly Cove M.D. on 08/07/2010 10:13:15 AM

## 2010-08-08 ENCOUNTER — Inpatient Hospital Stay (HOSPITAL_COMMUNITY): Payer: Federal, State, Local not specified - PPO

## 2010-08-08 LAB — DIFFERENTIAL
Basophils Absolute: 0 10*3/uL (ref 0.0–0.1)
Lymphs Abs: 2.2 10*3/uL (ref 0.7–4.0)
Monocytes Absolute: 1.5 10*3/uL — ABNORMAL HIGH (ref 0.1–1.0)
Monocytes Relative: 11 % (ref 3–12)

## 2010-08-08 LAB — CBC
MCHC: 32.7 g/dL (ref 30.0–36.0)
RDW: 14.6 % (ref 11.5–15.5)

## 2010-08-08 LAB — GLUCOSE, CAPILLARY
Glucose-Capillary: 103 mg/dL — ABNORMAL HIGH (ref 70–99)
Glucose-Capillary: 105 mg/dL — ABNORMAL HIGH (ref 70–99)
Glucose-Capillary: 132 mg/dL — ABNORMAL HIGH (ref 70–99)
Glucose-Capillary: 143 mg/dL — ABNORMAL HIGH (ref 70–99)

## 2010-08-08 LAB — BASIC METABOLIC PANEL
Chloride: 98 mEq/L (ref 96–112)
GFR calc non Af Amer: >60 mL/min (ref >60)
Glucose, Bld: 104 mg/dL — ABNORMAL HIGH (ref 70–99)
Potassium: 3.6 mEq/L (ref 3.5–5.1)
Sodium: 134 mEq/L — ABNORMAL LOW (ref 135–145)

## 2010-08-08 LAB — MAGNESIUM: Magnesium: 2.1 mg/dL (ref 1.5–2.5)

## 2010-08-08 MED ORDER — IOHEXOL 300 MG/ML  SOLN
100.0000 mL | Freq: Once | INTRAMUSCULAR | Status: AC | PRN
  Administered 2010-08-08: 100 mL via INTRAVENOUS

## 2010-08-08 NOTE — Group Therapy Note (Signed)
  NAMENIXON, [name]              ACCOUNT NO.:  [id number]  MEDICAL RECORD NO.:  [id number]           PATIENT TYPE:  I  LOCATION:  A315                          FACILITY:  [facility]  PHYSICIAN:  [name] Singer, M.D.DATE OF BIRTH:  [birth year]  DATE OF PROCEDURE:  08/08/2010 DATE OF DISCHARGE:                                PROGRESS NOTE   SUBJECTIVE:  This man is improving.  He passed flatus in the last 24 hours and he is happy about this.  Otherwise, there are no new medical problems.  OBJECTIVE:  VITAL SIGNS:  Temperature 98.5, blood pressure 96/61, pulse 78, saturation 98% on room air. HEART:  Sounds are present and normal. LUNG:  Fields are clear. ABDOMEN:  Soft, slightly still sore, but I can hear bowel sounds again.  INVESTIGATIONS:  Hemoglobin 8.7, white blood cell count 13.2, platelets 363.  Sodium 134, potassium 3.6, bicarbonate 31, BUN 14, creatinine 0.68.  Anemia panel indicates an iron-deficient picture with iron saturation only 6% and total iron 16.  IMPRESSION: 1. Adenocarcinoma of the transverse colon, status post hemicolectomy. 2. Partial small-bowel obstruction caused by colonic mass. 3. History of pulmonary embolism on Lovenox. 4. Iron-deficiency anemia. 5. Hypertension. 6. Protein-calorie malnutrition.  PLAN: 1. Start a soft diet now. 2. Decrease IV fluids as he is tolerating oral fluids better.     [name] Singer, M.D.     NCG/MEDQ  D:  08/08/2010  T:  08/08/2010  Job:  [id number]  Electronically Signed by Lilly Cove M.D. on 08/08/2010 05:09:42 PM

## 2010-08-09 ENCOUNTER — Inpatient Hospital Stay (HOSPITAL_COMMUNITY): Payer: Federal, State, Local not specified - PPO

## 2010-08-09 LAB — GLUCOSE, CAPILLARY: Glucose-Capillary: 143 mg/dL — ABNORMAL HIGH (ref 70–99)

## 2010-08-09 LAB — COMPREHENSIVE METABOLIC PANEL
ALT: 9 U/L (ref 0–53)
BUN: 13 mg/dL (ref 6–23)
CO2: 31 mEq/L (ref 19–32)
Calcium: 9 mg/dL (ref 8.4–10.5)
GFR calc non Af Amer: >60 mL/min (ref >60)
Glucose, Bld: 110 mg/dL — ABNORMAL HIGH (ref 70–99)
Sodium: 135 mEq/L (ref 135–145)
Total Protein: 5.8 g/dL — ABNORMAL LOW (ref 6.0–8.3)

## 2010-08-09 LAB — CBC
HCT: 26.2 % — ABNORMAL LOW (ref 39.0–52.0)
Hemoglobin: 8.6 g/dL — ABNORMAL LOW (ref 13.0–17.0)
MCH: 29.2 pg (ref 26.0–34.0)
MCHC: 32.8 g/dL (ref 30.0–36.0)
MCV: 88.8 fL (ref 78.0–100.0)
RDW: 14.7 % (ref 11.5–15.5)

## 2010-08-09 LAB — PHOSPHORUS: Phosphorus: 4.4 mg/dL (ref 2.3–4.6)

## 2010-08-09 LAB — URINALYSIS, ROUTINE W REFLEX MICROSCOPIC
Bilirubin Urine: NEGATIVE
Ketones, ur: NEGATIVE mg/dL
Nitrite: NEGATIVE
Specific Gravity, Urine: 1.02 (ref 1.005–1.030)
Urobilinogen, UA: 0.2 mg/dL (ref 0.0–1.0)
pH: 6.5 (ref 5.0–8.0)

## 2010-08-09 LAB — DIFFERENTIAL
Basophils Relative: 0 % (ref 0–1)
Eosinophils Relative: 1 % (ref 0–5)
Lymphs Abs: 2.2 10*3/uL (ref 0.7–4.0)
Monocytes Absolute: 1.4 10*3/uL — ABNORMAL HIGH (ref 0.1–1.0)
Monocytes Relative: 7 % (ref 3–12)
Neutro Abs: 16.6 10*3/uL — ABNORMAL HIGH (ref 1.7–7.7)

## 2010-08-09 NOTE — Group Therapy Note (Signed)
  [name], [name]              ACCOUNT NO.:  [id number]  MEDICAL RECORD NO.:  [id number]           PATIENT TYPE:  I  LOCATION:  A315                          FACILITY:  [facility]  PHYSICIAN:  [name] Singer, M.D.DATE OF BIRTH:  [birth year]  DATE OF PROCEDURE:  08/09/2010 DATE OF DISCHARGE:                                PROGRESS NOTE   HISTORY OF PRESENT ILLNESS:  This man is slow to improve.  He was able to tolerate a soft diet, but then he has now got a low-grade fever and white count is up again.  He denies shortness of breath or cough.  PHYSICAL EXAMINATION:  VITAL SIGNS:  Temperature 98.7, although it has been as high as 100 last evening; blood pressure 108/70; pulse 96; saturation 97% on room air. GENERAL:  He looks slightly pale today. HEART:  Sounds are present and normal. LUNG:  Fields show reduced air entry in the left mid and lower zones and dullness in this area. ABDOMEN:  Soft and tender as before slightly with bowel sounds present.  INVESTIGATIONS:  Hemoglobin 8.6, white count has gone up again to 20.4, platelets also up 454.  Sodium 135, potassium 4.3, bicarbonate 31, BUN 13, creatinine 0.77, albumin 2.3.  IMPRESSION: 1. Leukocytosis and low-grade fever, unclear etiology.  I wonder if he     also has a coexistent pneumonia or at least a pleural effusion,     which had been indicated previously on radiology. 2. Adenocarcinoma of the transverse colon, status post hemicolectomy. 3. History of pulmonary embolism on Lovenox. 4. Iron-deficiency anemia. 5. Hypertension. 6. Protein-calorie malnutrition.  PLAN: 1. I will repeated the chest x-ray today. 2. Continue rest of the treatment per Dr. Leticia Penna, Surgery.  Dr.     Leticia Penna and I spoke today and he thinks that if the patient does     not significantly improve or the white count continues to be high     and/or he has low-grade fevers, he may take this patient back to     the operating room.  The patient did  have a CT scan of his abdomen     done yesterday, which shows increased size of a complex mesenteric     fluid collection, question hematoma or     infected hematoma.  There was also comment about increase     mesenteric edema in the left upper quadrant on cul-de-sac.  It is     not clear what this represents.  The CT scan also suggesting new     bilateral pleural effusions and bibasilar air space disease.  I     think in view of this, we may broaden the antibiotic spectrum     coverage also.     [name] Singer, M.D.     NCG/MEDQ  D:  08/09/2010  T:  08/09/2010  Job:  [id number]  Electronically Signed by Lilly Cove M.D. on 08/09/2010 10:10:17 AM

## 2010-08-10 DIAGNOSIS — C189 Malignant neoplasm of colon, unspecified: Secondary | ICD-10-CM

## 2010-08-10 LAB — COMPREHENSIVE METABOLIC PANEL
Alkaline Phosphatase: 84 U/L (ref 39–117)
BUN: 13 mg/dL (ref 6–23)
Chloride: 98 mEq/L (ref 96–112)
Creatinine, Ser: 0.76 mg/dL (ref 0.4–1.5)
Glucose, Bld: 97 mg/dL (ref 70–99)
Potassium: 4.1 mEq/L (ref 3.5–5.1)
Total Bilirubin: 0.3 mg/dL (ref 0.3–1.2)

## 2010-08-10 LAB — GLUCOSE, CAPILLARY
Glucose-Capillary: 113 mg/dL — ABNORMAL HIGH (ref 70–99)
Glucose-Capillary: 96 mg/dL (ref 70–99)
Glucose-Capillary: 108 mg/dL — ABNORMAL HIGH (ref 70–99)

## 2010-08-10 LAB — PHOSPHORUS: Phosphorus: 3.4 mg/dL (ref 2.3–4.6)

## 2010-08-10 LAB — DIFFERENTIAL
Basophils Absolute: 0 10*3/uL (ref 0.0–0.1)
Basophils Relative: 0 % (ref 0–1)
Eosinophils Absolute: 0.3 10*3/uL (ref 0.0–0.7)
Eosinophils Relative: 3 % (ref 0–5)
Neutrophils Relative %: 75 % (ref 43–77)

## 2010-08-10 LAB — URINE CULTURE
Culture: NO GROWTH
Special Requests: NEGATIVE

## 2010-08-10 LAB — MAGNESIUM: Magnesium: 2.2 mg/dL (ref 1.5–2.5)

## 2010-08-10 LAB — CBC
Platelets: 446 10*3/uL — ABNORMAL HIGH (ref 150–400)
RDW: 14.7 % (ref 11.5–15.5)
WBC: 13.9 10*3/uL — ABNORMAL HIGH (ref 4.0–10.5)

## 2010-08-11 DIAGNOSIS — C189 Malignant neoplasm of colon, unspecified: Secondary | ICD-10-CM

## 2010-08-11 LAB — BASIC METABOLIC PANEL
BUN: 9 mg/dL (ref 6–23)
CO2: 32 mEq/L (ref 19–32)
Chloride: 99 mEq/L (ref 96–112)
Glucose, Bld: 95 mg/dL (ref 70–99)
Potassium: 3.8 mEq/L (ref 3.5–5.1)
Sodium: 135 mEq/L (ref 135–145)

## 2010-08-11 LAB — GLUCOSE, CAPILLARY: Glucose-Capillary: 90 mg/dL (ref 70–99)

## 2010-08-11 LAB — DIFFERENTIAL
Basophils Absolute: 0 10*3/uL (ref 0.0–0.1)
Eosinophils Relative: 4 % (ref 0–5)
Lymphocytes Relative: 22 % (ref 12–46)
Lymphs Abs: 2.3 10*3/uL (ref 0.7–4.0)
Neutrophils Relative %: 64 % (ref 43–77)

## 2010-08-11 LAB — PROTIME-INR
INR: 1.17 (ref 0.00–1.49)
Prothrombin Time: 15.1 seconds (ref 11.6–15.2)

## 2010-08-11 LAB — CBC
HCT: 24.7 % — ABNORMAL LOW (ref 39.0–52.0)
MCV: 90.1 fL (ref 78.0–100.0)
Platelets: 513 10*3/uL — ABNORMAL HIGH (ref 150–400)
RBC: 2.74 MIL/uL — ABNORMAL LOW (ref 4.22–5.81)
RDW: 14.8 % (ref 11.5–15.5)
WBC: 10.1 10*3/uL (ref 4.0–10.5)

## 2010-08-12 DIAGNOSIS — C189 Malignant neoplasm of colon, unspecified: Secondary | ICD-10-CM

## 2010-08-12 LAB — GLUCOSE, CAPILLARY
Glucose-Capillary: 116 mg/dL — ABNORMAL HIGH (ref 70–99)
Glucose-Capillary: 162 mg/dL — ABNORMAL HIGH (ref 70–99)
Glucose-Capillary: 96 mg/dL (ref 70–99)
Glucose-Capillary: 97 mg/dL (ref 70–99)

## 2010-08-12 LAB — BASIC METABOLIC PANEL
CO2: 27 mEq/L (ref 19–32)
Calcium: 8.9 mg/dL (ref 8.4–10.5)
Creatinine, Ser: 0.7 mg/dL (ref 0.4–1.5)
GFR calc Af Amer: >60 mL/min (ref >60)
GFR calc non Af Amer: >60 mL/min (ref >60)
Sodium: 134 mEq/L — ABNORMAL LOW (ref 135–145)

## 2010-08-12 LAB — CBC
Platelets: 535 10*3/uL — ABNORMAL HIGH (ref 150–400)
RBC: 2.74 MIL/uL — ABNORMAL LOW (ref 4.22–5.81)
RDW: 14.5 % (ref 11.5–15.5)
WBC: 10.3 10*3/uL (ref 4.0–10.5)

## 2010-08-12 LAB — DIFFERENTIAL
Basophils Absolute: 0 10*3/uL (ref 0.0–0.1)
Eosinophils Absolute: 0.4 10*3/uL (ref 0.0–0.7)
Eosinophils Relative: 4 % (ref 0–5)
Neutrophils Relative %: 67 % (ref 43–77)

## 2010-08-12 LAB — PROTIME-INR
INR: 1.28 (ref 0.00–1.49)
Prothrombin Time: 16.2 seconds — ABNORMAL HIGH (ref 11.6–15.2)

## 2010-08-13 DIAGNOSIS — C189 Malignant neoplasm of colon, unspecified: Secondary | ICD-10-CM

## 2010-08-13 LAB — COMPREHENSIVE METABOLIC PANEL
ALT: 6 U/L (ref 0–53)
BUN: 8 mg/dL (ref 6–23)
CO2: 29 mEq/L (ref 19–32)
Calcium: 9.1 mg/dL (ref 8.4–10.5)
GFR calc non Af Amer: >60 mL/min (ref >60)
Glucose, Bld: 122 mg/dL — ABNORMAL HIGH (ref 70–99)
Sodium: 134 mEq/L — ABNORMAL LOW (ref 135–145)

## 2010-08-13 LAB — GLUCOSE, CAPILLARY
Glucose-Capillary: 108 mg/dL — ABNORMAL HIGH (ref 70–99)
Glucose-Capillary: 105 mg/dL — ABNORMAL HIGH (ref 70–99)

## 2010-08-13 LAB — MAGNESIUM: Magnesium: 2.2 mg/dL (ref 1.5–2.5)

## 2010-08-13 LAB — PHOSPHORUS: Phosphorus: 3.3 mg/dL (ref 2.3–4.6)

## 2010-08-14 ENCOUNTER — Inpatient Hospital Stay (HOSPITAL_COMMUNITY): Payer: Federal, State, Local not specified - PPO

## 2010-08-14 DIAGNOSIS — C189 Malignant neoplasm of colon, unspecified: Secondary | ICD-10-CM

## 2010-08-14 LAB — URINALYSIS, ROUTINE W REFLEX MICROSCOPIC
Bilirubin Urine: NEGATIVE
Ketones, ur: NEGATIVE mg/dL
Leukocytes, UA: NEGATIVE
Nitrite: POSITIVE — AB
Protein, ur: NEGATIVE mg/dL
Urobilinogen, UA: 0.2 mg/dL (ref 0.0–1.0)
pH: 6.5 (ref 5.0–8.0)

## 2010-08-14 LAB — DIFFERENTIAL
Basophils Absolute: 0 10*3/uL (ref 0.0–0.1)
Basophils Relative: 0 % (ref 0–1)
Eosinophils Relative: 1 % (ref 0–5)
Monocytes Absolute: 1 10*3/uL (ref 0.1–1.0)
Monocytes Relative: 7 % (ref 3–12)

## 2010-08-14 LAB — COMPREHENSIVE METABOLIC PANEL
ALT: 8 U/L (ref 0–53)
CO2: 23 mEq/L (ref 19–32)
Calcium: 9 mg/dL (ref 8.4–10.5)
Creatinine, Ser: 0.78 mg/dL (ref 0.4–1.5)
GFR calc non Af Amer: >60 mL/min (ref >60)
Glucose, Bld: 116 mg/dL — ABNORMAL HIGH (ref 70–99)
Sodium: 139 mEq/L (ref 135–145)
Total Protein: 5.8 g/dL — ABNORMAL LOW (ref 6.0–8.3)

## 2010-08-14 LAB — GLUCOSE, CAPILLARY
Glucose-Capillary: 102 mg/dL — ABNORMAL HIGH (ref 70–99)
Glucose-Capillary: 115 mg/dL — ABNORMAL HIGH (ref 70–99)
Glucose-Capillary: 86 mg/dL (ref 70–99)

## 2010-08-14 LAB — CBC
HCT: 27.3 % — ABNORMAL LOW (ref 39.0–52.0)
MCH: 29.3 pg (ref 26.0–34.0)
MCHC: 33 g/dL (ref 30.0–36.0)
RDW: 14.7 % (ref 11.5–15.5)

## 2010-08-14 LAB — PROTIME-INR
INR: 1.84 — ABNORMAL HIGH (ref 0.00–1.49)
Prothrombin Time: 21.4 seconds — ABNORMAL HIGH (ref 11.6–15.2)

## 2010-08-14 MED ORDER — IOHEXOL 300 MG/ML  SOLN
100.0000 mL | Freq: Once | INTRAMUSCULAR | Status: AC | PRN
  Administered 2010-08-14: 100 mL via INTRAVENOUS

## 2010-08-15 LAB — CBC
HCT: 24.8 % — ABNORMAL LOW (ref 39.0–52.0)
Hemoglobin: 7.9 g/dL — ABNORMAL LOW (ref 13.0–17.0)
MCH: 28.3 pg (ref 26.0–34.0)
MCHC: 31.9 g/dL (ref 30.0–36.0)
RBC: 2.79 MIL/uL — ABNORMAL LOW (ref 4.22–5.81)

## 2010-08-15 LAB — DIFFERENTIAL
Basophils Relative: 0 % (ref 0–1)
Lymphocytes Relative: 25 % (ref 12–46)
Lymphs Abs: 2.4 10*3/uL (ref 0.7–4.0)
Monocytes Absolute: 1 10*3/uL (ref 0.1–1.0)
Monocytes Relative: 11 % (ref 3–12)
Neutro Abs: 5.8 10*3/uL (ref 1.7–7.7)
Neutrophils Relative %: 61 % (ref 43–77)

## 2010-08-15 LAB — PROTIME-INR: Prothrombin Time: 23 seconds — ABNORMAL HIGH (ref 11.6–15.2)

## 2010-08-15 LAB — CULTURE, BLOOD (ROUTINE X 2)
Culture: NO GROWTH
Culture: NO GROWTH

## 2010-08-15 LAB — PHOSPHORUS: Phosphorus: 4.1 mg/dL (ref 2.3–4.6)

## 2010-08-15 LAB — GLUCOSE, CAPILLARY: Glucose-Capillary: 90 mg/dL (ref 70–99)

## 2010-08-15 LAB — BASIC METABOLIC PANEL
CO2: 27 mEq/L (ref 19–32)
Calcium: 8.7 mg/dL (ref 8.4–10.5)
Chloride: 103 mEq/L (ref 96–112)
Creatinine, Ser: 0.8 mg/dL (ref 0.4–1.5)
GFR calc Af Amer: >60 mL/min (ref >60)
Sodium: 136 mEq/L (ref 135–145)

## 2010-08-16 LAB — BASIC METABOLIC PANEL
BUN: 7 mg/dL (ref 6–23)
BUN: 7 mg/dL (ref 6–23)
CO2: 27 mEq/L (ref 19–32)
Calcium: 8.4 mg/dL (ref 8.4–10.5)
Chloride: 104 mEq/L (ref 96–112)
Chloride: 104 mEq/L (ref 96–112)
Creatinine, Ser: 0.75 mg/dL (ref 0.4–1.5)
Creatinine, Ser: 0.82 mg/dL (ref 0.4–1.5)
GFR calc Af Amer: >60 mL/min (ref >60)
GFR calc Af Amer: >60 mL/min (ref >60)
GFR calc non Af Amer: >60 mL/min (ref >60)
Glucose, Bld: 97 mg/dL (ref 70–99)

## 2010-08-16 LAB — CBC
Hemoglobin: 7.5 g/dL — ABNORMAL LOW (ref 13.0–17.0)
MCHC: 32.1 g/dL (ref 30.0–36.0)
Platelets: 429 10*3/uL — ABNORMAL HIGH (ref 150–400)
RBC: 2.66 MIL/uL — ABNORMAL LOW (ref 4.22–5.81)

## 2010-08-16 LAB — MAGNESIUM: Magnesium: 1.9 mg/dL (ref 1.5–2.5)

## 2010-08-16 LAB — PROTIME-INR
INR: 1.7 — ABNORMAL HIGH (ref 0.00–1.49)
Prothrombin Time: 20.2 seconds — ABNORMAL HIGH (ref 11.6–15.2)

## 2010-08-16 LAB — DIFFERENTIAL
Basophils Absolute: 0 10*3/uL (ref 0.0–0.1)
Basophils Relative: 0 % (ref 0–1)
Eosinophils Absolute: 0.2 10*3/uL (ref 0.0–0.7)
Monocytes Absolute: 0.8 10*3/uL (ref 0.1–1.0)
Neutro Abs: 4.7 10*3/uL (ref 1.7–7.7)
Neutrophils Relative %: 63 % (ref 43–77)

## 2010-08-16 LAB — HEPARIN LEVEL (UNFRACTIONATED): Heparin Unfractionated: 0.46 IU/mL (ref 0.30–0.70)

## 2010-08-16 LAB — GLUCOSE, CAPILLARY
Glucose-Capillary: 144 mg/dL — ABNORMAL HIGH (ref 70–99)
Glucose-Capillary: 150 mg/dL — ABNORMAL HIGH (ref 70–99)
Glucose-Capillary: 124 mg/dL — ABNORMAL HIGH (ref 70–99)

## 2010-08-16 LAB — PHOSPHORUS: Phosphorus: 3.7 mg/dL (ref 2.3–4.6)

## 2010-08-16 LAB — ALBUMIN: Albumin: 2.2 g/dL — ABNORMAL LOW (ref 3.5–5.2)

## 2010-08-17 LAB — CBC
MCV: 88.5 fL (ref 78.0–100.0)
Platelets: 460 10*3/uL — ABNORMAL HIGH (ref 150–400)
RBC: 2.87 MIL/uL — ABNORMAL LOW (ref 4.22–5.81)
RDW: 14.7 % (ref 11.5–15.5)
WBC: 7.2 10*3/uL (ref 4.0–10.5)

## 2010-08-17 LAB — COMPREHENSIVE METABOLIC PANEL
Albumin: 2.5 g/dL — ABNORMAL LOW (ref 3.5–5.2)
Alkaline Phosphatase: 58 U/L (ref 39–117)
BUN: 6 mg/dL (ref 6–23)
Chloride: 104 mEq/L (ref 96–112)
Creatinine, Ser: 0.79 mg/dL (ref 0.4–1.5)
Glucose, Bld: 145 mg/dL — ABNORMAL HIGH (ref 70–99)
Potassium: 3.1 mEq/L — ABNORMAL LOW (ref 3.5–5.1)
Total Bilirubin: 0.2 mg/dL — ABNORMAL LOW (ref 0.3–1.2)

## 2010-08-17 LAB — PROTIME-INR
INR: 1.55 — ABNORMAL HIGH (ref 0.00–1.49)
Prothrombin Time: 18.8 seconds — ABNORMAL HIGH (ref 11.6–15.2)

## 2010-08-17 LAB — GLUCOSE, CAPILLARY
Glucose-Capillary: 103 mg/dL — ABNORMAL HIGH (ref 70–99)
Glucose-Capillary: 97 mg/dL (ref 70–99)

## 2010-08-17 LAB — DIFFERENTIAL
Basophils Relative: 1 % (ref 0–1)
Eosinophils Absolute: 0.2 10*3/uL (ref 0.0–0.7)
Eosinophils Relative: 2 % (ref 0–5)
Lymphs Abs: 2.6 10*3/uL (ref 0.7–4.0)
Neutrophils Relative %: 51 % (ref 43–77)

## 2010-08-17 LAB — ALBUMIN: Albumin: 2.5 g/dL — ABNORMAL LOW (ref 3.5–5.2)

## 2010-08-17 LAB — PREALBUMIN: Prealbumin: 19 mg/dL (ref 17.0–34.0)

## 2010-08-18 ENCOUNTER — Ambulatory Visit (HOSPITAL_COMMUNITY): Payer: Federal, State, Local not specified - PPO | Admitting: Oncology

## 2010-08-18 DIAGNOSIS — C189 Malignant neoplasm of colon, unspecified: Secondary | ICD-10-CM

## 2010-08-18 LAB — CHOLESTEROL, TOTAL: Cholesterol: 134 mg/dL (ref 0–200)

## 2010-08-18 LAB — BASIC METABOLIC PANEL
CO2: 28 mEq/L (ref 19–32)
Chloride: 105 mEq/L (ref 96–112)
Glucose, Bld: 104 mg/dL — ABNORMAL HIGH (ref 70–99)
Potassium: 3.6 mEq/L (ref 3.5–5.1)
Sodium: 136 mEq/L (ref 135–145)

## 2010-08-18 LAB — PROTIME-INR: Prothrombin Time: 19.2 seconds — ABNORMAL HIGH (ref 11.6–15.2)

## 2010-08-18 LAB — TRIGLYCERIDES: Triglycerides: 49 mg/dL (ref <150)

## 2010-08-18 NOTE — Discharge Summary (Signed)
[name], [name]              ACCOUNT NO.:  [id number]  MEDICAL RECORD NO.:  [id number]           PATIENT TYPE:  I  LOCATION:  A315                          FACILITY:  [facility]  PHYSICIAN:  [name], MD    DATE OF BIRTH:  March 29, 1950  DATE OF ADMISSION:  07/27/2010 DATE OF DISCHARGE:  LH                         DISCHARGE SUMMARY-REFERRING   PRIMARY CARE PRACTITIONER:  [name] in [place], [place].  PRIMARY DISCHARGE DIAGNOSES: 1. Small bowel obstruction with a mesenteric mass.  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 2. Status post exploratory laparotomy with left hemicolectomy and     primary colonic anastomoses.  Excision of small bowel mesenteric     mass. 3. Anemia secondary to critical illness. 4. Questionable anastomotic leak. 5. History of hypertension. 6. History of pulmonary embolism with current INR being     subtherapeutic, being overlapped with Lovenox and Coumadin.  SECONDARY DISCHARGE DIAGNOSES: 1. History of hypertension. 2. History of pulmonary embolism, on Coumadin therapy prior to     admission. 3. History of chronic alcoholism.  The patient has been abstinent     since January 2012.  DISCHARGE MEDICATIONS: 1. Lovenox 60 mg subcutaneously twice daily. 2. Flagyl 500 mg 1 tablet p.o. three times a day for 7 more days. 3. Levaquin 500 mg 1 tablet p.o. daily for 7 days. 4. Benicar 10 mg 1 tablet p.o. daily. 5. Percocet 5/325 one to two tablets p.o. q.6 h p.r.n. 6. Coumadin.  The patient to take 7.5 mg on Aug 18, 2010, and Aug 19, 2010, daily and then to call his primary care's office with an INR     check for further dosing. 7. Folic acid 1 mg 1 tablet daily. 8. Thiamine 100 mg 1 tablet daily.  CONSULTATION:  [name], MD for Surgery.  BRIEF HISTORY OF PRESENT ILLNESS:  The patient is an unfortunate 60 year old Caucasian male, who has had multiple abdominal surgeries in the past who has had multiple  small-bowel obstructions as well presented with abdominal pain and swelling on Jul 27, 2010.  He was then evaluated by a CAT scan of the abdomen and subsequent surgical evaluation.  For further details, please see the history and physical that was dictated by Dr. Vania [name] on admission.  PROCEDURES PERFORMED:  The patient underwent an exploratory laparotomy with left hemicolectomy with primary colonic anastomosis, excision of a small bowel mesenteric mass and extensive lysis of adhesions done by Dr. [name] on Jul 31, 2010.  DISCHARGE LABORATORY DATA: 1. Chemistries on discharge shows a sodium of 136, potassium of 3.6,     chloride of 105, bicarb of 28, glucose of 104, BUN of 4, creatinine     of 0.80, and a calcium of 8.5. 2. INR on discharge is 1.6.  PERTINENT RADIOLOGICAL STUDIES: 1. CT of the abdomen and pelvis done on Jul 28, 2010, showed     postoperative changes in the abdomen.  No definite evidence of     bowel obstruction, although there is edema infiltration in the     mesentry and mesenteric  cystic structure previously demonstrated on     the left side is now down the right side, suggesting possible     mesenteric volvulus.  No free fluid or air.  No inflammatory     changes.  Prostate enlargement. 2. CT of the abdomen and pelvis done Aug 08, 2010, showed increased     size of the complex mesenteric fluid collection.  Questionable     hematoma or infected hematoma.  Increased intraperitoneal air.     Correlate with recent surgery, given new midline laparotomy     staples.  Colonic and small bowel distention without focal     transition.  Question of dynamic ileus.  Increased mesenteric edema     in the left upper quadrant and cul-de-sac.  If the patient has not     undergone an interval surgery, findings are suspicious for     perforated viscus. 3. CT of the abdomen and pelvis done on Aug 14, 2010, showed     persistent significant amount of free  intraperitoneal air and     increased amount of left subdiaphragmatic fluid, persistent free     intraperitoneal air 2 weeks postoperative is atypical and raises     question of anastomotic leak or perforated viscus.  Foci of     extraluminal gases seen adjacent to the anastomotic staple line at     the left colon, which may suggest an anastomotic leak.  Persistent     visualization of the mesenteric collection in the left mid abdomen,     could represent hematoma, infected hematoma or less likely abscess     as this is not significantly changed since Aug 08, 2010.  BRIEF HOSPITAL COURSE: 1. Small-bowel obstruction.  The patient was admitted to the hospital     with a small bowel obstruction.  Further evaluation led to a CT     scan of the abdomen, the results of which are noted as above.  This     was then followed by a surgical evaluation, which then led to an     exploratory laparotomy with a diagnosis of small bowel colon mass.     Subsequent pathologies have now come back as colonic     adenocarcinoma.  As noted above, the patient did undergo an     exploratory laparotomy with left hemicolectomy and colonic     anastomosis on Aug 03, 2010.  His postoperative course was     prolonged and the patient was very slow to respond.  He was     maintained on a number of days on PCA pump for analgesia and also     was on a TNA.  Postoperatively, he had some episodes of fever and     leukocytosis that lead to repeat CAT scan done on Aug 08, 2010, the     results of which are noted as above.  Following this, the patient     was again restarted on antibiotics.  The patient was then slowly     advanced with diet, was doing well until Aug 14, 2010, when the     patient again had worsening of his abdominal pain and nausea.  This     was associated with sweating.  His WBC during this time also had     increased to 14.9.  Repeat CAT scan of his abdomen was then done on     Aug 14, 2010, the results  of which are noted as above.  The patient     again was briefly kept n.p.o.  IV antibiotics in the form of Zosyn     and Flagyl was started.  Surgery continued to follow.  Over the     next few days, it was felt that his clinical picture was not     suggestive of anastomotic leak, as there was disparity between     clinical findings and the CT of abdomen and pelvis results.  Over     the last few days, he has been advanced to a soft diet which he     tolerates very well.  He has been persistently afebrile and has     nontoxic appearance.  I have spoken to Dr. Leticia Penna today and he has     cleared the patient for discharge as well.  He suggests continuing     Levaquin and Flagyl for another week and to follow up with him     within a week or so as well.  In terms of his colon cancer, it is     believed that this is at stage IIIB.  He has an appointment with     Dr. Mariel Sleet at the [facility] on August 25, 2010, at     9:30 a.m. for further continued care.  The patient has been advised     of this and he has been recommended to keep this appointment. 2. Anemia.  This is secondary to critical illness.  The patient did     not require transfusion.  He has just been monitored with a     periodic hemoglobin and hematocrit.  Further monitoring will     possibly be done by his primary care practitioner or his primary     oncologist on followup. 3. Hypertension.  The patient postoperatively was given IV Lopressor     and IV Vasotec for his hypertension.  However, at the time of     discharge, he is tolerating only Benicar as noted above.  Perhaps     it can be further continued increase in functional ability and     further overall general condition improvement and blood pressure     may increase.  For now, he will be just discharged on Benicar for     further followup and optimization of his antihypertensive regimen     by his primary care practitioner. 4. History of pulmonary  embolism.  The patient's INR is subtherapeutic     on discharge.  He has currently been given both overlap of Lovenox     and Coumadin.  He has been instructed to continue taking Lovenox     until his INR is therapeutic.  The patient did receive 7.5 mg of     Coumadin yesterday and will receive 10 mg of Coumadin prior to     discharge today.  He will continue to take 7.5 mg of Coumadin for     tomorrow and day after tomorrow and then call his primary care's     office with the INR results and then further dosing will be done by     his primary care.  Please note that this patient's wife works for     his primary care's office and per the patient and per our case     Theme park manager, the patient has home INR monitoring which his     wife does and calls results into his primary care's office.  Again  as noted above, the patient will take 7.5 mg of Coumadin for the     next 2 days, have an INR checked and call that result into his     primary care's office for further dosing instruction.  All of this     was explained to the patient by me personally in detail.  This     information has also been placed on the pink discharge sheet as     well.  DISPOSITION:  It is felt that this patient has met maximal benefit from his inpatient hospital stay and has been cleared by General Surgery for discharge as well.  FOLLOWUP INSTRUCTIONS: 1. The patient will follow up at the [facility] on August 25, 2010, at 9:30 a.m. 2. The patient will follow up with [name], his primary care     practitioner within 3-5 days. 3. The patient will follow up with Dr. Leticia Penna in 7-10 days. 4. All of this has been explained to the patient by me regarding     followup appointments. 5. The patient also has been instructed to be on a soft diet until he     is seen by Dr. Leticia Penna.  Total time spent coordinating discharge equals 65 minutes.     [name], MD     SG/MEDQ  D:   08/18/2010  T:  08/18/2010  Job:  [id number]  cc:   [name] Fax: [phone]  [name], MD Fax: [phone]  Ladona Horns. Mariel Sleet, MD Fax: [phone]  Electronically Signed by [name]  on 08/18/2010 06:33:16 PM

## 2010-08-19 NOTE — Group Therapy Note (Signed)
  [name], [name]              ACCOUNT NO.:  [id number]  MEDICAL RECORD NO.:  [id number]           PATIENT TYPE:  LOCATION:                                 FACILITY:  PHYSICIAN:  [name] Singer, M.D.DATE OF BIRTH:  1951/03/03  DATE OF PROCEDURE:  08/10/2010 DATE OF DISCHARGE:                                PROGRESS NOTE   This man says he feels better.  He is able to tolerate a diet, and he was mobilizing yesterday in the hallway.  He does not have a cough nor does he have shortness of breath.  A chest x-ray done yesterday shows mild bibasilar atelectasis and a small left pleural effusion which are largely unchanged.  PHYSICAL EXAMINATION:  VITAL SIGNS:  Temperature 98.7, blood pressure 100/64, pulse 84, saturation 97% on room air. CARDIAC:  Heart sounds are present and normal. LUNGS:  Lung fields are clear posteriorly. ABDOMEN:  Soft and slightly tender.  Bowel sounds are heard and appear to be normal.  INVESTIGATIONS:  Hemoglobin 7.9, white blood cell count down now to 13.9 from yesterday when it was 20.4, platelets 446.  Sodium 132, potassium 4.1, BUN 13, creatinine 0.76, albumin obviously remains low at 2.3.  IMPRESSION: 1. Leukocytosis and low-grade fever have improved.  The etiology of     this is not entirely clear.  He does not seem to have any gross     pneumonia. 2. 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 3. History of pulmonary embolism, on Lovenox therapeutic doses. 4. Iron-deficiency anemia. 5. Hypertension. 6. Protein-calorie malnutrition.  PLAN: 1. Continue current treatment. 2. Start Coumadin if okay with Surgery. 3. Possibly advance a diet depending on surgery.    [name] Singer, M.D.    NCG/MEDQ  D:  08/10/2010  T:  08/10/2010  Job:  [id number]  Electronically Signed by Lilly Cove M.D. on 08/19/2010 12:32:56 PM

## 2010-08-19 NOTE — Group Therapy Note (Signed)
  NAME:  [name], [name]              ACCOUNT NO.:  [id number]  MEDICAL RECORD NO.:  [id number]           PATIENT TYPE:  I  LOCATION:  A315                          FACILITY:  [facility]  PHYSICIAN:  [name] Singer, M.D.DATE OF BIRTH:  1951/03/03  DATE OF PROCEDURE:  08/11/2010 DATE OF DISCHARGE:                                PROGRESS NOTE   CURRENT DIAGNOSES: 1. Newly diagnosed adenocarcinoma of the colon, status post left     hemicolectomy, on TPN and Dilaudid PCA pump. 2. History of pulmonary embolism, on anticoagulation with Lovenox and     warfarin. 3. Iron-deficiency anemia, likely secondary to blood loss anemia,     status post left hemicolectomy.  DISCHARGE MEDICATIONS:  To be dictated at the time of discharge.  CONSULTATIONS: 1. Dr. [name], General Surgery. 2. Dr. Laurel Dimmer from Oncology.  HISTORY:  This 60 year old man was admitted on May 7 with abdominal pain and swelling.  Please see initial history and physical examination done by Dr. Vedia Coffer.  He was seen by Surgery and had a laparotomy and found to have adenocarcinoma of the colon and transverse colon.  He underwent a left hemicolectomy with end-to-end anastomosis.  He is now postoperative, and he is slow to progress in terms of improving.  Dr. [name] is managing his postsurgical care.  He continues on a PCA pump and is on full-liquid diet for the time being, and I have changed him to a soft diet today to see if he will tolerate this.  I have encouraged him to discontinue the PCA pump and use oral pain medications, and I will leave this at the discretion of Dr. Leticia Penna also.  PHYSICAL EXAMINATION:  VITAL SIGNS:  Temperature 98, blood pressure 96/59, pulse 89, saturation 95% on room air.  He does look systemically well, and he is ambulating reasonably well. HEART:  Sounds are present and normal. LUNGS:  Fields are clear, except for some reduced air entry in the right base. ABDOMEN:  Soft and  mildly tender and bowel sounds are heard.  In fact, he has passed flatus.  INVESTIGATIONS:  Hemoglobin 8.0, white blood cell count 10.1, platelets 513.  The white blood cell count has now stayed down and is improving for the last couple of days.  Sodium 135, potassium 3.8, bicarbonate 32, BUN 9, creatinine 0.76.  PLAN: 1. Continue present post surgical care by Surgery. 2. Continue anticoagulation and pharmacy is dosing this. 3. Start soft diet. 4. I will strongly consider discontinuing the PCA pump and use oral     opioids for analgesia at this point now.  I think he is now just     over 7 days postoperative, and he should be improving to the point     where he should be able to be discharged from the hospital soon,     but his progress appears to be somewhat slow.     [name] Singer, M.D.     NCG/MEDQ  D:  08/11/2010  T:  08/11/2010  Job:  [id number]  Electronically Signed by Lilly Cove M.D. on 08/19/2010 12:33:08 PM

## 2010-08-20 LAB — CROSSMATCH: Unit division: 0

## 2010-08-25 ENCOUNTER — Other Ambulatory Visit (HOSPITAL_COMMUNITY): Payer: Self-pay | Admitting: Oncology

## 2010-08-25 ENCOUNTER — Encounter (HOSPITAL_COMMUNITY): Payer: Federal, State, Local not specified - PPO | Attending: Oncology | Admitting: Oncology

## 2010-08-25 DIAGNOSIS — Z7901 Long term (current) use of anticoagulants: Secondary | ICD-10-CM | POA: Insufficient documentation

## 2010-08-25 DIAGNOSIS — I10 Essential (primary) hypertension: Secondary | ICD-10-CM | POA: Insufficient documentation

## 2010-08-25 DIAGNOSIS — Z86718 Personal history of other venous thrombosis and embolism: Secondary | ICD-10-CM | POA: Insufficient documentation

## 2010-08-25 DIAGNOSIS — C189 Malignant neoplasm of colon, unspecified: Secondary | ICD-10-CM

## 2010-08-25 DIAGNOSIS — Z79899 Other long term (current) drug therapy: Secondary | ICD-10-CM | POA: Insufficient documentation

## 2010-08-25 LAB — PROTIME-INR: INR: 2.17 — ABNORMAL HIGH (ref 0.00–1.49)

## 2010-08-25 LAB — CBC
Hemoglobin: 9.8 g/dL — ABNORMAL LOW (ref 13.0–17.0)
MCH: 28.5 pg (ref 26.0–34.0)
MCV: 88.1 fL (ref 78.0–100.0)
RBC: 3.44 MIL/uL — ABNORMAL LOW (ref 4.22–5.81)

## 2010-08-25 LAB — COMPREHENSIVE METABOLIC PANEL
ALT: 8 U/L (ref 0–53)
AST: 19 U/L (ref 0–37)
Alkaline Phosphatase: 71 U/L (ref 39–117)
CO2: 28 mEq/L (ref 19–32)
GFR calc Af Amer: >60 mL/min (ref >60)
Glucose, Bld: 89 mg/dL (ref 70–99)
Potassium: 4.8 mEq/L (ref 3.5–5.1)
Sodium: 135 mEq/L (ref 135–145)
Total Protein: 6 g/dL (ref 6.0–8.3)

## 2010-08-25 LAB — DIFFERENTIAL
Eosinophils Absolute: 0.1 10*3/uL (ref 0.0–0.7)
Eosinophils Relative: 2 % (ref 0–5)
Lymphocytes Relative: 31 % (ref 12–46)
Lymphs Abs: 2.3 10*3/uL (ref 0.7–4.0)
Monocytes Relative: 11 % (ref 3–12)

## 2010-08-25 LAB — CEA: CEA: 3.3 ng/mL (ref 0.0–5.0)

## 2010-08-26 ENCOUNTER — Other Ambulatory Visit (HOSPITAL_COMMUNITY): Payer: Self-pay | Admitting: Oncology

## 2010-08-26 DIAGNOSIS — C189 Malignant neoplasm of colon, unspecified: Secondary | ICD-10-CM

## 2010-08-28 ENCOUNTER — Ambulatory Visit (HOSPITAL_COMMUNITY)
Admission: RE | Admit: 2010-08-28 | Discharge: 2010-08-28 | Disposition: A | Discharge status: Home / Self Care | Payer: Federal, State, Local not specified - PPO | Source: Ambulatory Visit | Attending: Oncology | Admitting: Oncology

## 2010-08-28 DIAGNOSIS — C189 Malignant neoplasm of colon, unspecified: Secondary | ICD-10-CM

## 2010-08-28 DIAGNOSIS — J984 Other disorders of lung: Secondary | ICD-10-CM | POA: Insufficient documentation

## 2010-08-31 ENCOUNTER — Ambulatory Visit (HOSPITAL_COMMUNITY): Payer: Federal, State, Local not specified - PPO | Admitting: Oncology

## 2010-09-02 ENCOUNTER — Encounter (HOSPITAL_COMMUNITY): Payer: Federal, State, Local not specified - PPO

## 2010-09-02 ENCOUNTER — Encounter (HOSPITAL_COMMUNITY): Payer: Federal, State, Local not specified - PPO | Admitting: Oncology

## 2010-09-02 ENCOUNTER — Other Ambulatory Visit: Payer: Self-pay | Admitting: Infectious Diseases

## 2010-09-02 DIAGNOSIS — C189 Malignant neoplasm of colon, unspecified: Secondary | ICD-10-CM

## 2010-09-04 ENCOUNTER — Ambulatory Visit (HOSPITAL_COMMUNITY)
Admission: RE | Admit: 2010-09-04 | Discharge: 2010-09-04 | Disposition: A | Discharge status: Home / Self Care | Payer: Federal, State, Local not specified - PPO | Source: Ambulatory Visit | Attending: General Surgery | Admitting: General Surgery

## 2010-09-04 ENCOUNTER — Ambulatory Visit (HOSPITAL_COMMUNITY): Payer: Federal, State, Local not specified - PPO

## 2010-09-04 DIAGNOSIS — Z01812 Encounter for preprocedural laboratory examination: Secondary | ICD-10-CM | POA: Insufficient documentation

## 2010-09-04 DIAGNOSIS — Z79899 Other long term (current) drug therapy: Secondary | ICD-10-CM | POA: Insufficient documentation

## 2010-09-04 DIAGNOSIS — C189 Malignant neoplasm of colon, unspecified: Secondary | ICD-10-CM | POA: Insufficient documentation

## 2010-09-04 DIAGNOSIS — Z01818 Encounter for other preprocedural examination: Secondary | ICD-10-CM | POA: Insufficient documentation

## 2010-09-04 DIAGNOSIS — I10 Essential (primary) hypertension: Secondary | ICD-10-CM | POA: Insufficient documentation

## 2010-09-07 ENCOUNTER — Other Ambulatory Visit (HOSPITAL_COMMUNITY): Payer: Self-pay | Admitting: Oncology

## 2010-09-07 ENCOUNTER — Encounter (HOSPITAL_COMMUNITY): Payer: Federal, State, Local not specified - PPO

## 2010-09-07 DIAGNOSIS — C189 Malignant neoplasm of colon, unspecified: Secondary | ICD-10-CM

## 2010-09-07 DIAGNOSIS — Z5111 Encounter for antineoplastic chemotherapy: Secondary | ICD-10-CM

## 2010-09-07 LAB — DIFFERENTIAL
Eosinophils Absolute: 0.1 10*3/uL (ref 0.0–0.7)
Eosinophils Relative: 2 % (ref 0–5)
Lymphs Abs: 2.5 10*3/uL (ref 0.7–4.0)
Monocytes Relative: 12 % (ref 3–12)

## 2010-09-07 LAB — CBC
HCT: 30.7 % — ABNORMAL LOW (ref 39.0–52.0)
Hemoglobin: 10 g/dL — ABNORMAL LOW (ref 13.0–17.0)
MCH: 28.2 pg (ref 26.0–34.0)
MCV: 86.7 fL (ref 78.0–100.0)
RBC: 3.54 MIL/uL — ABNORMAL LOW (ref 4.22–5.81)

## 2010-09-07 LAB — COMPREHENSIVE METABOLIC PANEL
Alkaline Phosphatase: 88 U/L (ref 39–117)
BUN: 7 mg/dL (ref 6–23)
Calcium: 9.9 mg/dL (ref 8.4–10.5)
GFR calc Af Amer: >60 mL/min (ref >60)
Glucose, Bld: 75 mg/dL (ref 70–99)
Potassium: 4.4 mEq/L (ref 3.5–5.1)
Total Protein: 6.9 g/dL (ref 6.0–8.3)

## 2010-09-09 ENCOUNTER — Encounter (HOSPITAL_COMMUNITY): Payer: Federal, State, Local not specified - PPO

## 2010-09-09 DIAGNOSIS — C189 Malignant neoplasm of colon, unspecified: Secondary | ICD-10-CM

## 2010-09-09 DIAGNOSIS — Z452 Encounter for adjustment and management of vascular access device: Secondary | ICD-10-CM

## 2010-09-09 NOTE — Consult Note (Signed)
NAMEGENARO, BEKKER              ACCOUNT NO.:  [id number]  MEDICAL RECORD NO.:  [id number]  LOCATION:                                 FACILITY:  PHYSICIAN:  Ladona Horns. Mariel Sleet, MD  DATE OF BIRTH:  June 12, 1950  DATE OF CONSULTATION:  08/04/2010 DATE OF DISCHARGE:                                CONSULTATION   REFERRING PHYSICIAN:  Dr. Trula Ore Rama  REASON FOR REFERRAL:  Stage III colon cancer.  HISTORY OF PRESENT ILLNESS:  The patient explains to me that he has been having some abdominal pain and swelling.  According to the patient since January, he has been reporting a number of small bowel obstructions. The patient has a history of small bowel obstructions and therefore understands the symptoms.  Unfortunately in the past, we were unable to obtain radiographic evidence of his small-bowel symptomatology.  As a result, his primary care physician asked the patient to call him when he had these symptoms, and he would order a stat radiographic study.  This was done and shows small bowel obstruction and thus the patient was directly admitted to the hospital. Admitted on Jul 27, 2010.  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  No free fluid or air. No inflammatory changes.  Prostatic enlargement.  The patient was seen in consultation with Dr. [name] on Jul 29, 2010.  On Aug 03, 2010, the patient underwent exploratory laparotomy, left hemicolectomy with primary colonic anastomoses, excision of small bowel mesenteric mass, and extensive lysis of adhesions.  Pathology report from that procedure reveals a colonic adenocarcinoma with mucinous features extending into pericolonic connective  tissue. Margins were not involved.  Metastatic carcinoma in one of the 18 lymph nodes was identified.  Maximum tumor size was 2.5 cm.  There is a moderately differentiated low grade cancer.  Microscopic extension of invasive tumor was identified into pericolonic connective tissue and focally into the subserosal connective tissue.  LVI was identified. This is therefore a pT3, pN1a, pMX cancer.  The patient does admit to some variations in stools.  He tells me that occasionally they would be thin in nature.  Other times it would be normal.  He does admit to mucus in his stool.  He does admit to black tarry stools in the past.  He denies any blood visualized in his stools.  The patient denies any gastrointestinal flatulence or bowel movement since surgery.  The patient denies any PTSD.  The patient admits to night sweats that occur 2-3 times weekly.  His night sweats are bad enough that he must change his clothes and/or pillow case.  The patient does admit to a 20-pound weight loss over the last few months.  The patient denies any headache, dizziness, double vision, fevers, chills, nausea, vomiting, diarrhea, constipation, chest pain, heart palpitations, shortness of breath, blood in stool, urinary pain, urinary burning, urinary frequency, or hematuria.  PAST MEDICAL HISTORY: 1. History of multiple  abdominal surgeries. 2. History of small bowel obstructions. 3. Chronic alcoholism, sober since detox in January 2012. 4. Pulmonary embolism, diagnosed at [facility], on March 08, 2010, completing 6 months of Coumadin anticoagulation. 5. Hypertension. 6. Questionable transient ischemic attack in January 2012.  ALLERGIES:  No known drug allergies  CURRENT MEDICATIONS: 1. Enalapril 0.625 mg every 6 hours. 2. Lovenox 60 mg every 12 hours. 3. Dilaudid 7.5 mg every 4 hours. 4. NovoLog 1 to 9 units every 6 hours. 5. Toprol XL 25 mg daily. 6. Lopressor 5 mg every 4  hours. 7. Nicotine 40 mg every 24 hours. 8. Nutritional supplement 240 mL 3 times daily. 9. Benicar 40 mg daily. 10.Protonix 40 mg daily. 11.Zosyn 3/0.375 mg every 8 hours. 12.Potassium chloride 10 mEq daily. 13.Ondansetron 4 mg every 6 hours p.r.n.  PAST SURGICAL HISTORY: 1. 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 2. Between 1982 and 1988, a total of eight abdominal surgeries for     adhesions related to prior surgeries and at times requiring     intestinal resection as per conversation with the patient.  FAMILY HISTORY:  The patient tells me his father passed away at the age of 80 due to myocardial infarction.  His mother passed away at the age of 42 due to pneumonia.  She also had hypertension and arthritis.  The patient has one sister who is 60 years' old and is alive.  She has hypertension and she is a drug addict.  The patient has 3 children.  He has a 29 year old daughter, a 25 year old daughter, and a 63 year old son.  All of whom are alive and well with no medical problems that he is aware of.  The patient admits to a family history of heart disease and hypertension.  The patient denies a family history of diabetes mellitus, stroke, cancer, bleeding disorders, asthma, arthritis, tuberculosis, epilepsy, or mental illnesses.  SOCIAL HISTORY:  The patient was born in [place], [place].  He presently resides in [place], [place].  The patient has masters degree in business.  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  The patient has been married for 38 years.  The patient lives with his wife at home.  The patient denies illicit drug use.  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 The patient does admit to being a heavy alcohol abuser.  He tells me that he used to drink approximately one bottle of wine (750 mL)  daily.  Screening Tests:  The patient tells me he did have a colonoscopy in the late 1990s by Dr. Yvette Rack.  PERFORMANCE STATUS:  The patient's ECOG performance status is presently A3.  He tells me at home.  He is able to do much more and at that point in time prior to his admission the patient with an ECOG status of two.  PHYSICAL EXAMINATION:  VITAL SIGNS:  Vitals obtained at 0600 hours reveals a temperature of 99.0, pulse 84, respirations 20, blood pressure 149/86, oxygen saturation 99% on room air. GENERAL:  The patient is seen lying in bed.  He does not appear to be in any acute distress.  He appears very pleasant.  He is alert and oriented x3. HEENT:  Atraumatic and normocephalic.  Anicteric sclerae.  PERRLA. NECK:  No carotid bruits appreciated bilaterally.  No anterior  or posterior cervical chain lymphadenopathy noted.  No submental or submandibular nodes noted.  Trachea is midline.  Neck is supple. CARDIAC:  Regular rate and rhythm without murmur, rub, or gallop.  No S3 or S4 appreciated. LUNGS:  Clear to auscultation bilaterally without wheezes, rales, or rhonchi.  No accessory muscle use appreciated. ABDOMEN:  There is a midline incision that is healing with staples in place.  There is a clean dressing in place.  No signs of infection. This is not hot to the touch.  No discharge appreciated.  Abdomen is slightly distended.  Nontender on light palpation.  I deferred examination for hepatosplenomegaly at this point in time. EXTREMITIES:  No upper or lower extremity edema appreciated bilaterally. No tenderness to palpation of the calves or popliteal fossa bilaterally. Pedal pulses are intact bilaterally. LYMPHATICS:  No infraclavicular or supraclavicular lymphadenopathy noted.  No axillary nodes appreciated.  No inguinal nodes noted.  No epitrochlear nodes are appreciated. SKIN:  Capillary refill less than 1 second in fingers.  Skin is warm and dry.  Rapid turgor.  Radial  pulse regular and strong 2+. NEURO:  No focal deficits appreciated.  The patient is alert and oriented x3.  LABORATORY DATA:  Laboratory data obtained on Aug 04, 2010, reveals a white blood cell count of 12.0, hemoglobin 8.5, hematocrit 25.4, and platelet count 172.  Sodium 131, potassium 3.5, chloride 95, bicarbonate 31, BUN 7, creatinine 0.55, glucose 102.  Phosphorus 2.5, magnesium 1.9. CA19-9 obtained on Jul 28, 2010, is 1.4.  CEA obtained on Jul 28, 2010, was 2.2.  RADIOGRAPHIC STUDIES:  CT of abdomen and pelvis with contrast performed on Jul 28, 2010, reveals postoperative changes in the abdomen.  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  No free fluid or air.  No inflammatory changes.  Prostatic enlargement.  An x-ray of chest performed on Aug 02, 2010, reveals low lung volumes with streaky bibasilar atelectasis.  Free intra-abdominal air likely due to recent surgery.  Chest x-ray performed on Aug 04, 2010 reveals new right arm PICC line in appropriate position.  Decreased bibasilar atelectasis.  Decreased postop free intra peritoneal air.  Pathology:  Surgical pathology report from Jul 31, 2010, reveals a colonic adenocarcinoma with mucinous features extending into pericolonic connective tissue.  Margins not involved.  Metastatic carcinoma in one of 18 lymph nodes maximum tumor size 2.5 cm.  This is a moderately differentiated low grade cancer.  Microscopic extension of the invasive tumor identified into the pericolonic connective tissue and focally into the subserosal connective tissue.  Lymphovascular invasion is identified.  Therefore this is a pathologically stage pT3, pN1a, pMx cancer.  ASSESSMENT: 1. 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     One  of 18 positive lymph nodes for metastatic carcinoma. 2. History of multiple abdominal surgeries. 3. History of small bowel obstructions. 4. Pulmonary embolism diagnosed at [facility] on March 08, 2010, completing 6 months of Coumadin anticoagulation. 5. Chronic alcoholism, sober since detoxification in January 2012. 6. Hypertension. 7. Questionable transient ischemic attack in January 2012.  PLAN:  Plan is as follows: 1. This is a patient who would be a good candidate for chemotherapy.     He has a stage III B colon cancer. 2. I will discuss this patient with Dr. Mariel Sleet and  further     recommendations may follow regarding chemotherapy. 3. I suspect in the near future we will be asking the patient's     surgeon for Port-A-Cath placement for chemotherapy administration. 4. As mentioned above, I will discuss this patient's case with Dr.     Mariel Sleet and further recommendations will be following.  All questions were answered.  The patient knows to call the clinic with any problems, questions, or concerns.  The patient and plan discussed with Dr. Mariel Sleet, and he is in agreement with the aforementioned.    ______________________________ Maurine Minister Kefalas III, PA-C   ______________________________ Ladona Horns. Mariel Sleet, MD    TSK/MEDQ  D:  08/05/2010  T:  08/06/2010  Job:  [id number]  Electronically Signed by Dellis Anes III PA on 09/09/2010 02:05:01 PM Electronically Signed by Glenford Peers MD on 09/09/2010 02:26:57 PM

## 2010-09-09 NOTE — Op Note (Signed)
[name], [name]              ACCOUNT NO.:  [id number]  MEDICAL RECORD NO.:  [id number]  LOCATION:  [facility]                          FACILITY:  [facility]  PHYSICIAN:  [name], MD      DATE OF BIRTH:  Nov 06, 1950  DATE OF PROCEDURE:  09/04/2010 DATE OF DISCHARGE:                              OPERATIVE REPORT   PREOPERATIVE DIAGNOSIS:  Colon cancer.  POSTOPERATIVE DIAGNOSIS:  Colon cancer.  PROCEDURE: 1. Insertion of a power port to the left subclavian vein approach. 2. Intraoperative fluoroscopy with surgeon interpretation.  SURGEON:  [name], MD  ANESTHESIA:  MAC sedation with local anesthetic.  Local anesthetic 1% lidocaine plain.  SPECIMEN:  None.  ESTIMATED BLOOD LOSS:  Minimal.  INDICATIONS:  The patient is a 60 year old male well known to me after allowing recent left hemicolectomy for suspected colon mass, which did return positive for colon adenocarcinoma.  At this point, the plans are to proceed with chemotherapy.  The risks, benefits, alternatives were placed over and discussed at length with the patient.  His questions and concerns were addressed.  The patient has consented for planned procedure.  OPERATION IN DETAIL:  The patient was taken to the operating room and was placed in supine position on the operating table at which time the MAC sedation was administered.  Once the patient was sedated, his left neck and chest wall were prepped with DuraPrep solution and draped in standard fashion.  At this point, 1% lidocaine was injected along the planned course of venipuncture.  A 18-gauge introducer needle was utilized to identify the left subclavian vein.  Good venous return was obtained.  J-wire was advanced.  Fluoroscopy was confirmed.  The J-wire was advanced down the superior vena cava.  At this point, the J-wire was secured to the drapes with a hemostat.  Attention was turned to creation the subcuticular pocket.  Using the 1% lidocaine in the  side of the pocket, the tunnel was anesthetized with 1% lidocaine.  Skin incision was made with a 15 blade scalpel.  The pocket was expanded using a combination of electrocautery and blunt digital dissection.  Upon adequately sizing the pocket, the catheter was advanced using a subcuticular tunneler to the venipuncture site.  At this point, the dilator insertion sheath was advanced down the J-wire and under direct visualization of fluoroscopy, the dilator moved without difficulty down the J-wire.  At this point, the J-wire and its dilator were removed.  The catheter was inserted and was trimmed at 21 cm.  It was attached to the port.  Port was aspirated.  Good venous return was obtained and heparinized solution was instilled down the port and catheter.  At this point, one final fluoroscopy was utilized to confirm that the port was in excellent position there was no kinking or sharp angulation of the catheter.  At this time, the port was secured to the deep subcuticular tissue with a 3-0 Vicryl.  The deep subcu tissue was also reapproximated using a 3-0 Vicryl and then the skin edges were reapproximated using a 4-0 Monocryl in a running subcuticular suture. The skin was washed and was dried with a dry towel.  Benzoin was applied  around incision.  Half inch Steri-Strips placed.  Drapes were removed. The patient was allowed to come out of general anesthetic and was transferred back to regular hospital in stable condition.  At the occlusion of the procedure, all instrument, sponge, and needle counts were correct.  The patient tolerated the procedure well.     [name], MD     BZ/MEDQ  D:  09/04/2010  T:  09/05/2010  Job:  [id number]  cc:   Ladona Horns. Mariel Sleet, MD Fax: [phone]  Electronically Signed by [name] MD on 09/09/2010 11:48:49 AM

## 2010-09-18 ENCOUNTER — Encounter (HOSPITAL_COMMUNITY): Payer: Federal, State, Local not specified - PPO

## 2010-09-18 ENCOUNTER — Other Ambulatory Visit (HOSPITAL_COMMUNITY): Payer: Self-pay | Admitting: Oncology

## 2010-09-18 DIAGNOSIS — R634 Abnormal weight loss: Secondary | ICD-10-CM

## 2010-09-18 DIAGNOSIS — C189 Malignant neoplasm of colon, unspecified: Secondary | ICD-10-CM

## 2010-09-21 ENCOUNTER — Ambulatory Visit (HOSPITAL_COMMUNITY)
Admission: RE | Admit: 2010-09-21 | Discharge: 2010-09-21 | Disposition: A | Discharge status: Home / Self Care | Payer: Federal, State, Local not specified - PPO | Source: Ambulatory Visit | Attending: Oncology | Admitting: Oncology

## 2010-09-21 ENCOUNTER — Inpatient Hospital Stay (HOSPITAL_COMMUNITY): Payer: Federal, State, Local not specified - PPO

## 2010-09-21 DIAGNOSIS — C189 Malignant neoplasm of colon, unspecified: Secondary | ICD-10-CM

## 2010-09-21 DIAGNOSIS — Z85038 Personal history of other malignant neoplasm of large intestine: Secondary | ICD-10-CM | POA: Insufficient documentation

## 2010-09-21 DIAGNOSIS — R634 Abnormal weight loss: Secondary | ICD-10-CM

## 2010-09-21 DIAGNOSIS — R1310 Dysphagia, unspecified: Secondary | ICD-10-CM | POA: Insufficient documentation

## 2010-09-22 ENCOUNTER — Other Ambulatory Visit (HOSPITAL_COMMUNITY): Payer: Self-pay | Admitting: Oncology

## 2010-09-22 ENCOUNTER — Encounter (HOSPITAL_COMMUNITY): Payer: Self-pay | Admitting: Oncology

## 2010-09-22 DIAGNOSIS — C189 Malignant neoplasm of colon, unspecified: Secondary | ICD-10-CM

## 2010-09-25 ENCOUNTER — Other Ambulatory Visit (HOSPITAL_COMMUNITY): Payer: Federal, State, Local not specified - PPO

## 2010-09-26 ENCOUNTER — Encounter (HOSPITAL_COMMUNITY): Payer: Self-pay | Admitting: Oncology

## 2010-09-26 ENCOUNTER — Emergency Department (HOSPITAL_COMMUNITY): Payer: Federal, State, Local not specified - PPO

## 2010-09-26 ENCOUNTER — Other Ambulatory Visit (HOSPITAL_COMMUNITY): Payer: Federal, State, Local not specified - PPO

## 2010-09-26 ENCOUNTER — Encounter (HOSPITAL_COMMUNITY): Payer: Self-pay | Admitting: Internal Medicine

## 2010-09-26 ENCOUNTER — Inpatient Hospital Stay (HOSPITAL_COMMUNITY): Payer: Federal, State, Local not specified - PPO

## 2010-09-26 ENCOUNTER — Other Ambulatory Visit: Payer: Self-pay

## 2010-09-26 ENCOUNTER — Inpatient Hospital Stay (HOSPITAL_COMMUNITY)
Admission: EM | Admit: 2010-09-26 | Discharge: 2010-10-01 | DRG: 174 | Disposition: A | Discharge status: Home / Self Care | Payer: Federal, State, Local not specified - PPO | Attending: Internal Medicine | Admitting: Internal Medicine

## 2010-09-26 ENCOUNTER — Encounter (HOSPITAL_COMMUNITY): Payer: Self-pay | Admitting: Emergency Medicine

## 2010-09-26 DIAGNOSIS — I10 Essential (primary) hypertension: Secondary | ICD-10-CM | POA: Diagnosis present

## 2010-09-26 DIAGNOSIS — R109 Unspecified abdominal pain: Secondary | ICD-10-CM | POA: Diagnosis present

## 2010-09-26 DIAGNOSIS — K560 Paralytic ileus: Secondary | ICD-10-CM | POA: Diagnosis present

## 2010-09-26 DIAGNOSIS — M810 Age-related osteoporosis without current pathological fracture: Secondary | ICD-10-CM | POA: Diagnosis present

## 2010-09-26 DIAGNOSIS — K2210 Ulcer of esophagus without bleeding: Secondary | ICD-10-CM | POA: Diagnosis present

## 2010-09-26 DIAGNOSIS — T451X5A Adverse effect of antineoplastic and immunosuppressive drugs, initial encounter: Secondary | ICD-10-CM | POA: Diagnosis present

## 2010-09-26 DIAGNOSIS — K2100 Gastro-esophageal reflux disease with esophagitis, without bleeding: Secondary | ICD-10-CM | POA: Diagnosis present

## 2010-09-26 DIAGNOSIS — K920 Hematemesis: Principal | ICD-10-CM | POA: Diagnosis present

## 2010-09-26 DIAGNOSIS — D72819 Decreased white blood cell count, unspecified: Secondary | ICD-10-CM | POA: Diagnosis present

## 2010-09-26 DIAGNOSIS — T45515A Adverse effect of anticoagulants, initial encounter: Secondary | ICD-10-CM | POA: Diagnosis present

## 2010-09-26 DIAGNOSIS — R791 Abnormal coagulation profile: Secondary | ICD-10-CM | POA: Diagnosis present

## 2010-09-26 DIAGNOSIS — D6832 Hemorrhagic disorder due to extrinsic circulating anticoagulants: Secondary | ICD-10-CM

## 2010-09-26 DIAGNOSIS — J209 Acute bronchitis, unspecified: Secondary | ICD-10-CM | POA: Diagnosis not present

## 2010-09-26 DIAGNOSIS — D638 Anemia in other chronic diseases classified elsewhere: Secondary | ICD-10-CM | POA: Diagnosis present

## 2010-09-26 DIAGNOSIS — R112 Nausea with vomiting, unspecified: Secondary | ICD-10-CM | POA: Diagnosis present

## 2010-09-26 DIAGNOSIS — Z86718 Personal history of other venous thrombosis and embolism: Secondary | ICD-10-CM

## 2010-09-26 DIAGNOSIS — E871 Hypo-osmolality and hyponatremia: Secondary | ICD-10-CM | POA: Diagnosis present

## 2010-09-26 DIAGNOSIS — K922 Gastrointestinal hemorrhage, unspecified: Secondary | ICD-10-CM | POA: Diagnosis present

## 2010-09-26 DIAGNOSIS — R509 Fever, unspecified: Secondary | ICD-10-CM | POA: Diagnosis not present

## 2010-09-26 DIAGNOSIS — D62 Acute posthemorrhagic anemia: Secondary | ICD-10-CM | POA: Diagnosis present

## 2010-09-26 DIAGNOSIS — E538 Deficiency of other specified B group vitamins: Secondary | ICD-10-CM | POA: Diagnosis present

## 2010-09-26 DIAGNOSIS — C189 Malignant neoplasm of colon, unspecified: Secondary | ICD-10-CM | POA: Diagnosis present

## 2010-09-26 HISTORY — DX: Other pulmonary embolism without acute cor pulmonale: I26.99

## 2010-09-26 HISTORY — DX: Essential (primary) hypertension: I10

## 2010-09-26 HISTORY — DX: Unspecified osteoarthritis, unspecified site: M19.90

## 2010-09-26 HISTORY — DX: Personal history of pulmonary embolism: Z86.711

## 2010-09-26 HISTORY — DX: Shortness of breath: R06.02

## 2010-09-26 HISTORY — DX: Acquired absence of stomach (part of): Z90.3

## 2010-09-26 HISTORY — DX: Transient cerebral ischemic attack, unspecified: G45.9

## 2010-09-26 HISTORY — DX: Alcohol abuse, uncomplicated: F10.10

## 2010-09-26 LAB — URINALYSIS, ROUTINE W REFLEX MICROSCOPIC
Ketones, ur: NEGATIVE mg/dL
Leukocytes, UA: NEGATIVE
Nitrite: NEGATIVE
Protein, ur: NEGATIVE mg/dL

## 2010-09-26 LAB — COMPREHENSIVE METABOLIC PANEL
ALT: 9 U/L (ref 0–53)
AST: 17 U/L (ref 0–37)
Albumin: 3.3 g/dL — ABNORMAL LOW (ref 3.5–5.2)
Alkaline Phosphatase: 85 U/L (ref 39–117)
Chloride: 102 mEq/L (ref 96–112)
Potassium: 4.3 mEq/L (ref 3.5–5.1)
Sodium: 133 mEq/L — ABNORMAL LOW (ref 135–145)
Total Bilirubin: 0.2 mg/dL — ABNORMAL LOW (ref 0.3–1.2)

## 2010-09-26 LAB — CBC
HCT: 27.9 % — ABNORMAL LOW (ref 39.0–52.0)
MCH: 27.7 pg (ref 26.0–34.0)
MCHC: 33.7 g/dL (ref 30.0–36.0)
Platelets: 241 10*3/uL (ref 150–400)
Platelets: 204 10*3/uL (ref 150–400)
RDW: 15 % (ref 11.5–15.5)
WBC: 3.2 10*3/uL — ABNORMAL LOW (ref 4.0–10.5)

## 2010-09-26 LAB — POCT I-STAT, CHEM 8
BUN: 6 mg/dL (ref 6–23)
Calcium, Ion: 1.14 mmol/L (ref 1.12–1.32)
HCT: 29 % — ABNORMAL LOW (ref 39.0–52.0)
TCO2: 17 mmol/L (ref 0–100)

## 2010-09-26 LAB — DIFFERENTIAL
Basophils Absolute: 0 10*3/uL (ref 0.0–0.1)
Lymphocytes Relative: 53 % — ABNORMAL HIGH (ref 12–46)
Monocytes Relative: 18 % — ABNORMAL HIGH (ref 3–12)
Neutro Abs: 0.9 10*3/uL — ABNORMAL LOW (ref 1.7–7.7)
Neutrophils Relative %: 28 % — ABNORMAL LOW (ref 43–77)

## 2010-09-26 LAB — MRSA PCR SCREENING: MRSA by PCR: NEGATIVE

## 2010-09-26 LAB — PROTIME-INR
INR: 6.52 (ref 0.00–1.49)
Prothrombin Time: 57.9 seconds — ABNORMAL HIGH (ref 11.6–15.2)
Prothrombin Time: 22.6 seconds — ABNORMAL HIGH (ref 11.6–15.2)

## 2010-09-26 MED ORDER — HYDROMORPHONE HCL 1 MG/ML IJ SOLN
1.0000 mg | INTRAMUSCULAR | Status: DC | PRN
  Administered 2010-09-27 (×3): 1 mg via INTRAVENOUS
  Filled 2010-09-26 (×3): qty 1

## 2010-09-26 MED ORDER — ONDANSETRON HCL 4 MG/2ML IJ SOLN
4.0000 mg | Freq: Four times a day (QID) | INTRAMUSCULAR | Status: DC | PRN
  Administered 2010-09-28 – 2010-09-30 (×3): 4 mg via INTRAVENOUS
  Filled 2010-09-26 (×3): qty 2

## 2010-09-26 MED ORDER — SODIUM CHLORIDE 0.9 % IV SOLN
999.0000 mL | INTRAVENOUS | Status: DC
  Administered 2010-09-26: 1000 mL via INTRAVENOUS

## 2010-09-26 MED ORDER — PHYTONADIONE 10 MG/ML INJECTION
10.0000 mg | Freq: Once | INTRAMUSCULAR | Status: DC
  Administered 2010-09-26: 10 mg via INTRAVENOUS

## 2010-09-26 MED ORDER — SODIUM CHLORIDE 0.9 % IV SOLN
999.0000 mL | INTRAVENOUS | Status: AC
  Administered 2010-09-26 – 2010-09-27 (×2): 999 mL via INTRAVENOUS

## 2010-09-26 MED ORDER — PANTOPRAZOLE SODIUM 40 MG IV SOLR
40.0000 mg | Freq: Once | INTRAVENOUS | Status: AC
  Administered 2010-09-26: 40 mg via INTRAVENOUS
  Filled 2010-09-26: qty 40

## 2010-09-26 MED ORDER — ACETAMINOPHEN 325 MG PO TABS
650.0000 mg | ORAL_TABLET | Freq: Four times a day (QID) | ORAL | Status: DC | PRN
  Administered 2010-09-26: 650 mg via ORAL
  Filled 2010-09-26: qty 2

## 2010-09-26 MED ORDER — ALBUTEROL SULFATE (2.5 MG/3ML) 0.083% IN NEBU: 2.5000 mg | INHALATION_SOLUTION | RESPIRATORY_TRACT | Status: DC | PRN

## 2010-09-26 MED ORDER — PANTOPRAZOLE SODIUM 40 MG IV SOLR
40.0000 mg | Freq: Two times a day (BID) | INTRAVENOUS | Status: DC
  Administered 2010-09-27: 40 mg via INTRAVENOUS
  Filled 2010-09-26: qty 40

## 2010-09-26 MED ORDER — PANTOPRAZOLE SODIUM 40 MG IV SOLR
INTRAVENOUS | Status: AC
  Filled 2010-09-26: qty 40

## 2010-09-26 MED ORDER — ACETAMINOPHEN 650 MG RE SUPP: 650.0000 mg | Freq: Four times a day (QID) | RECTAL | Status: DC | PRN

## 2010-09-26 MED ORDER — PROMETHAZINE HCL 25 MG/ML IJ SOLN
12.5000 mg | Freq: Four times a day (QID) | INTRAMUSCULAR | Status: DC | PRN
  Administered 2010-09-27: 12.5 mg via INTRAVENOUS
  Filled 2010-09-26: qty 1

## 2010-09-26 MED ORDER — HYDROMORPHONE HCL 1 MG/ML IJ SOLN
INTRAMUSCULAR | Status: AC
  Administered 2010-09-26: 1 mg
  Filled 2010-09-26: qty 1

## 2010-09-26 MED ORDER — VITAMIN K1 10 MG/ML IJ SOLN
INTRAMUSCULAR | Status: AC
  Administered 2010-09-26: 10 mg via INTRAVENOUS
  Filled 2010-09-26: qty 1

## 2010-09-26 MED ORDER — LORAZEPAM 0.5 MG PO TABS: 1.0000 mg | ORAL_TABLET | ORAL | Status: DC | PRN

## 2010-09-26 MED ORDER — SODIUM CHLORIDE 0.9 % IV SOLN
999.0000 mL | Freq: Once | INTRAVENOUS | Status: AC
  Administered 2010-09-26: 1000 mL via INTRAVENOUS

## 2010-09-26 MED ORDER — ONDANSETRON HCL 4 MG/2ML IJ SOLN
4.0000 mg | Freq: Once | INTRAMUSCULAR | Status: AC
  Administered 2010-09-26: 4 mg via INTRAVENOUS
  Filled 2010-09-26: qty 2

## 2010-09-26 NOTE — ED Notes (Signed)
Pt c/o vomiting blood x 4 since this am. States that it was bright red. Also c/o loose stools x 5 this am. Pt is on chemotherapy and had his last treatment 3 weeks ago. Pt alert and oriented x 3. Skin warm and dry. Color pink. Breath sounds clear and equal bilaterally.

## 2010-09-26 NOTE — H&P (Signed)
RITHWIK SCHMIEG is an 60 y.o. male.    Chief Complaint: Blood in vomit  HPI: This is a 60 year old Caucasian male who was diagnosed recently with colon cancer. Patient underwent left colectomy and removal of  mesenteric mass in May of 2011. Patient had a primary colonic anastomosis. He was seen in followup by oncologist, Dr. Mariel Sleet. Patient underwent chemotherapy about 3 weeks ago with unknown agents and next chemotherapy is due on Monday.  Patient tells me that he was in his usual state of health until today when he woke up in the morning and started having nausea. He vomited up blood according to the patient. He had 3 such episodes. He describes the emesis as dark red. It been having decreased energy all day today. He had some episode of dizziness and lightheadedness, but did not have any syncopal episodes. He had four loose stools, which he characterized as being dark. He denies having any blood in the stools or black colored stools. He denies any fever, but did have some chills. He says he's been eating and drinking normally up until today. His last episode of emesis was at 2 PM this afternoon. He said he's had an endoscopy many years ago for unclear reasons. He says his been taking his medications as on a regular basis. He says for his arthritis He sometimes takes Aleve although not excessively.  Patient also complains of abdominal pain now which is located to the left side of his at incision site. The pain is 6/10 in intensity with no radiation. He denies any acid reflux. Is unable to mention any weight loss.     Past Medical History  Diagnosis Date  . 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 Arthritis   . Pulmonary embolism   . Acid reflux   . Hypertension   . Osteoporosis   . Shortness of breath     Past Surgical History  Procedure Date  . Abdominal surgery   . Hernia repair   . Appendectomy   . Cholecystectomy   . Colon surgery   . Colectomy   .  Portacath placement   . Abdominal sugery     for bowel obstructin x 8    History reviewed. No pertinent family history. Social History:  reports that he has been smoking.  He does not have any smokeless tobacco history on file. He reports that he does not drink alcohol or use illicit drugs.  Home medication list. Briefly, he is on the following:  B12 injections q. monthly, Flexeril, as needed, although he's not taken this in a while, Ativan as needed, Benicar daily, Megace daily, omeprazole daily, warfarin 8 mg daily, Compazine as needed, Zofran as needed, Vicodin, Aleve as needed, thiamine daily, folic acid daily,. Please review the medication list in the EMR for full details.   Allergies: No Known Allergies   Results for orders placed during the hospital encounter of 09/26/10 (from the past 48 hour(s))  CBC     Status: Abnormal   Collection Time   09/26/10  4:40 PM      Component Value Range Comment   WBC 3.2 (*) 4.0 - 10.5 (K/uL)    RBC 3.39 (*) 4.22 - 5.81 (MIL/uL)    Hemoglobin 9.4 (*) 13.0 - 17.0 (g/dL)    HCT 78.2 (*) 95.6 - 52.0 (%)    MCV 82.3  78.0 - 100.0 (fL)    MCH 27.7  26.0 - 34.0 (pg)    MCHC 33.7  30.0 - 36.0 (g/dL)    RDW 81.1  91.4 - 78.2 (%)    Platelets 241  150 - 400 (K/uL)   DIFFERENTIAL     Status: Abnormal   Collection Time   09/26/10  4:40 PM      Component Value Range Comment   Neutrophils Relative 28 (*) 43 - 77 (%)    Lymphocytes Relative 53 (*) 12 - 46 (%)    Monocytes Relative 18 (*) 3 - 12 (%)    Eosinophils Relative 1  0 - 5 (%)    Basophils Relative 0  0 - 1 (%)    Neutro Abs 0.9 (*) 1.7 - 7.7 (K/uL)    Lymphs Abs 1.7  0.7 - 4.0 (K/uL)    Monocytes Absolute 0.6  0.1 - 1.0 (K/uL)    Eosinophils Absolute 0.0  0.0 - 0.7 (K/uL)    Basophils Absolute 0.0  0.0 - 0.1 (K/uL)    Smear Review LARGE PLATELETS PRESENT     COMPREHENSIVE METABOLIC PANEL     Status: Abnormal   Collection Time   09/26/10  4:40 PM      Component Value Range Comment    Sodium 133 (*) 135 - 145 (mEq/L)    Potassium 4.3  3.5 - 5.1 (mEq/L)    Chloride 102  96 - 112 (mEq/L)    CO2 21  19 - 32 (mEq/L)    Glucose, Bld 80  70 - 99 (mg/dL)    BUN 8  6 - 23 (mg/dL)    Creatinine, Ser 9.56  0.50 - 1.35 (mg/dL) **Please note change in reference range.**   Calcium 8.5  8.4 - 10.5 (mg/dL)    Total Protein 6.2  6.0 - 8.3 (g/dL)    Albumin 3.3 (*) 3.5 - 5.2 (g/dL)    AST 17  0 - 37 (U/L)    ALT 9  0 - 53 (U/L)    Alkaline Phosphatase 85  39 - 117 (U/L)    Total Bilirubin 0.2 (*) 0.3 - 1.2 (mg/dL)    GFR calc non Af Amer >60  >60 (mL/min)    GFR calc Af Amer >60  >60 (mL/min)   PROTIME-INR     Status: Abnormal   Collection Time   09/26/10  4:40 PM      Component Value Range Comment   Prothrombin Time 57.9 (*) 11.6 - 15.2 (seconds)    INR 6.52 (*) 0.00 - 1.49    APTT     Status: Abnormal   Collection Time   09/26/10  4:40 PM      Component Value Range Comment   aPTT 59 (*) 24 - 37 (seconds)   PREPARE FRESH FROZEN PLASMA     Status: Normal (Preliminary result)   Collection Time   09/26/10  4:40 PM      Component Value Range Comment   Unit Number [id number]      Blood Component Type THAWED PLASMA      Unit division 00      Status of Unit ISSUED      Transfusion Status OK TO TRANSFUSE     PREPARE RBC (CROSSMATCH)     Status: Normal   Collection Time   09/26/10  4:40 PM      Component Value Range Comment   Order Confirmation ORDER PROCESSED BY BLOOD BANK     TYPE AND SCREEN     Status: Normal (Preliminary result)   Collection Time   09/26/10  4:41 PM  Component Value Range Comment   ABO/RH(D) O POS      Antibody Screen NEG      Sample Expiration 09/29/2010      Unit Number [id number]      Blood Component Type RED CELLS,LR      Unit division 00      Status of Unit ALLOCATED      Transfusion Status OK TO TRANSFUSE      Crossmatch Result Compatible     POCT I-STAT, CHEM 8     Status: Abnormal   Collection Time   09/26/10  5:04 PM      Component Value Range  Comment   Sodium 136  135 - 145 (mEq/L)    Potassium 4.2  3.5 - 5.1 (mEq/L)    Chloride 108  96 - 112 (mEq/L)    BUN 6  6 - 23 (mg/dL)    Creatinine, Ser 5.78  0.50 - 1.35 (mg/dL)    Glucose, Bld 77  70 - 99 (mg/dL)    Calcium, Ion 4.69  1.12 - 1.32 (mmol/L)    TCO2 17  0 - 100 (mmol/L)    Hemoglobin 9.9 (*) 13.0 - 17.0 (g/dL)    HCT 62.9 (*) 52.8 - 52.0 (%)   URINALYSIS, ROUTINE W REFLEX MICROSCOPIC     Status: Abnormal   Collection Time   09/26/10  5:38 PM      Component Value Range Comment   Color, Urine YELLOW  YELLOW     Appearance CLEAR  CLEAR     Specific Gravity, Urine >1.030 (*) 1.005 - 1.030     pH 5.5  5.0 - 8.0     Glucose, UA NEGATIVE  NEGATIVE (mg/dL)    Hgb urine dipstick NEGATIVE  NEGATIVE     Bilirubin Urine NEGATIVE  NEGATIVE     Ketones NEGATIVE  NEGATIVE (mg/dL)    Protein NEGATIVE  NEGATIVE (mg/dL)    Urobilinogen, UA 0.2  0.0 - 1.0 (mg/dL)    Nitrite NEGATIVE  NEGATIVE     Leukocytes, UA NEGATIVE  NEGATIVE  MICROSCOPIC NOT DONE ON URINES WITH NEGATIVE PROTEIN, BLOOD, LEUKOCYTES, NITRITE, OR GLUCOSE <1000 mg/dL.   No results found.  Review of his previous labs showed no significant drop in his hemoglobin at this time.  Review of Systems  Constitutional: Positive for chills and malaise/fatigue.  Respiratory: Positive for shortness of breath.   Neurological: Positive for dizziness and weakness. Negative for focal weakness, seizures and loss of consciousness.    Blood pressure 124/96, pulse 107, temperature 99.1 F (37.3 C), temperature source Oral, resp. rate 16, height 5\' 9"  (1.753 m), weight 53.978 kg (119 lb), SpO2 100.00%. Physical Exam  Vitals reviewed. Constitutional: He is oriented to person, place, and time. He appears well-developed and well-nourished. No distress.  HENT:  Head: Normocephalic and atraumatic.  Mouth/Throat: Oropharynx is clear and moist.  Eyes: Pupils are equal, round, and reactive to light. No scleral icterus.  Neck: Normal  range of motion. Neck supple. No JVD present. No tracheal deviation present. No thyromegaly present.  Cardiovascular: Regular rhythm and normal heart sounds.  Tachycardia present.   Respiratory: Effort normal and breath sounds normal. No stridor. No respiratory distress. He has no wheezes. He has no rales. He exhibits no tenderness.  GI: Soft. Bowel sounds are normal. He exhibits no mass. There is tenderness. There is no rebound and no guarding.  Genitourinary: Penis normal.  Musculoskeletal: Normal range of motion. He exhibits no edema and no tenderness.  Lymphadenopathy:    He has no cervical adenopathy.  Neurological: He is alert and oriented to person, place, and time. No cranial nerve deficit. He exhibits normal muscle tone. Coordination normal.  Skin: Skin is warm and dry. No rash noted.     Assessment/Plan So essentially this is a 60 year old white male was with a recent diagnosis of colon cancer which is adenocarcinoma. He has received one cycle of chemotherapy. He presented to the hospital with episodes of bloody emesis. He was mildly hypotensive and tachycardic when he presented to the ED.   #1 possible upper GI bleed. This patient will be admitted to the step down unit. He'll be given protonix intravenously every 12 hours. The reason for his GI bleed is possibly the supratherapeutic INR. Already spoken about this patient with Dr. Dian Situ and he will see this patient in the morning. We will check his CBCs every 6 hours off x3. Patient may end up requiring an upper endoscopy.  #2 supratherapeutic INR. Has been given a dose of vitamin K. Since he is not actively bleeding He doesn't need ffp at this time. I will monitor his CBCs closely, and we'll transfuse him as needed. For obvious reasons His warfarin will be discontinued for now.  #3 anemia: likely a combination of chronic disease as well as acute blood loss. As mentioned above We will check his CBCs every 6 hours and transfuse him as  needed. Anemia panel will be checked.  #4 history of pulmonary embolism. This was diagnosed in December of 2011. Patient continues to be on Coumadin. Due to GI bleed INR will be reversed. Decisions regarding reinitiation of Coumadin will depend on further investigations.  #5 recently diagnosed colon cancer. We will inform Dr. Mariel Sleet on Monday about this patient. He is supposed to get next dose of his chemotherapy on Monday, but considering his admission, this will be unlikely.  #6 history of anxiety: continue with Ativan as needed.  #7 mild hypotension and tachycardia: This will be managed with IV fluids, and he'll be monitored closely in the step down unit.  #8 history of B12 deficiency.   #9 history of arthritis. Monitor.  #10. DVT prophylaxis with sequential compressive devices.  #11. He is a full code.  Further management decisions will depend on results of further testing and patient's response to treatment.    Nahiara Kretzschmar 09/26/2010, 8:36 PM

## 2010-09-26 NOTE — ED Notes (Signed)
Pt started vomiting blood around lunch today and is dark red x 3. Denies pain. Diarrhea today also and is black. Color wnl. Last normal bm last night. Alert/oriented.

## 2010-09-26 NOTE — ED Notes (Signed)
EDP notified of critical INR

## 2010-09-26 NOTE — ED Provider Notes (Addendum)
History     Chief Complaint  Patient presents with  . Hematemesis   HPI Comments: Pt is con coumadin and has noticed dark stools as well.  Patient is a 60 y.o. male presenting with vomiting. The history is provided by the patient.  Emesis  The current episode started 3 to 5 hours ago. The problem has been gradually worsening. The emesis has an appearance of bright red blood. There has been no fever. Associated symptoms include chills. Pertinent negatives include no abdominal pain, no cough, no diarrhea, no fever and no myalgias.    Past Medical History  Diagnosis Date  . 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 Arthritis   . Pulmonary embolism   . Acid reflux   . Hypertension   . Osteoporosis     Past Surgical History  Procedure Date  . Abdominal surgery   . Hernia repair     History reviewed. No pertinent family history.  History  Substance Use Topics  . Smoking status: Current Everyday Smoker  . Smokeless tobacco: Not on file  . Alcohol Use: No      Review of Systems  Constitutional: Positive for chills. Negative for fever.  Respiratory: Negative for cough.   Gastrointestinal: Positive for vomiting. Negative for abdominal pain and diarrhea.  Genitourinary: Negative for dysuria.  Musculoskeletal: Negative for myalgias.  Neurological: Positive for light-headedness.  All other systems reviewed and are negative.    Physical Exam  BP 97/63  Pulse 113  Temp(Src) 97.8 F (36.6 C) (Oral)  Resp 20  Ht 5\' 9"  (1.753 m)  Wt 119 lb (53.978 kg)  BMI 17.57 kg/m2  SpO2 97%  Physical Exam  Constitutional: He appears well-developed and well-nourished. No distress.  HENT:  Head: Normocephalic and atraumatic.  Right Ear: External ear normal.  Left Ear: External ear normal.  Eyes: Right eye exhibits no discharge. Left eye exhibits no discharge. No scleral icterus.       Pale   Neck: Neck supple. No tracheal deviation present.  Cardiovascular:  Normal rate, regular rhythm and intact distal pulses.   Pulses:      Radial pulses are 2+ on the right side, and 2+ on the left side.       Dorsalis pedis pulses are 2+ on the right side, and 2+ on the left side.  Pulmonary/Chest: Effort normal and breath sounds normal. No stridor. No respiratory distress. He has no wheezes. He has no rales.  Abdominal: Soft. Bowel sounds are normal. He exhibits no distension. There is no tenderness. There is no rebound and no guarding.  Musculoskeletal: He exhibits no edema and no tenderness.  Neurological: He is alert. He has normal strength. No sensory deficit. Cranial nerve deficit:  no gross defecits noted. He exhibits normal muscle tone. He displays no seizure activity. Coordination normal.  Skin: Skin is warm and dry. No rash noted.  Psychiatric: He has a normal mood and affect.    ED Course  CRITICAL CARE Performed by: Linwood Dibbles R Authorized by: Linwood Dibbles R Total critical care time: 30 minutes Critical care time was exclusive of separately billable procedures and treating other patients. Critical care was necessary to treat or prevent imminent or life-threatening deterioration of the following conditions: gi bleeding on coumadin. Critical care was time spent personally by me on the following activities: development of treatment plan with patient or surrogate, discussions with consultants, examination of patient, evaluation of patient's response to treatment, ordering and review of laboratory studies  and re-evaluation of patient's condition.   EKG  Date: 09/07/2010   Rate: 91  Rhythm: normal sinus rhythm PAC  QRS Axis: normal  Intervals: normal  ST/T Wave abnormalities: normal  Conduction Disutrbances:none  Narrative Interpretation:   Old EKG Reviewed: none available    MDM Pt with GI bleed on coumadin.  Appears hemodynamically stable.  Will order vitamin K and transfuse ffp.  Will plan on admission.  Hold off on FEIBA at this point as his  anemia is only slightly increased and bp is stable.    INR was elevated at 6.  Pt remains stable.  Continuing to monitor.  6:29 PM    Celene Kras, MD 09/26/10 1747  Celene Kras, MD 09/26/10 [phone]

## 2010-09-26 NOTE — ED Notes (Signed)
Dr. Rito Ehrlich ordered Dilaudid 1mg  IV to be given now; medication pulled and given to Ashley Royalty RN to administer

## 2010-09-27 ENCOUNTER — Encounter (HOSPITAL_COMMUNITY): Payer: Self-pay | Admitting: *Deleted

## 2010-09-27 LAB — BASIC METABOLIC PANEL
BUN: 7 mg/dL (ref 6–23)
Calcium: 8.3 mg/dL — ABNORMAL LOW (ref 8.4–10.5)
Chloride: 105 mEq/L (ref 96–112)
Creatinine, Ser: 0.78 mg/dL (ref 0.50–1.35)
GFR calc Af Amer: >60 mL/min (ref >60)
GFR calc non Af Amer: >60 mL/min (ref >60)

## 2010-09-27 LAB — FERRITIN: Ferritin: 40 ng/mL (ref 22–322)

## 2010-09-27 LAB — RETICULOCYTES
RBC.: 3.3 MIL/uL — ABNORMAL LOW (ref 4.22–5.81)
Retic Count, Absolute: 29.7 10*3/uL (ref 19.0–186.0)
Retic Ct Pct: 0.9 % (ref 0.4–3.1)

## 2010-09-27 LAB — FOLATE: Folate: 16.7 ng/mL

## 2010-09-27 LAB — IRON AND TIBC
Saturation Ratios: 44 % (ref 20–55)
UIBC: 161 ug/dL

## 2010-09-27 LAB — GLUCOSE, CAPILLARY: Glucose-Capillary: 80 mg/dL (ref 70–99)

## 2010-09-27 LAB — PROTIME-INR
INR: 1.54 — ABNORMAL HIGH (ref 0.00–1.49)
Prothrombin Time: 18.8 seconds — ABNORMAL HIGH (ref 11.6–15.2)

## 2010-09-27 LAB — CBC
MCHC: 33.1 g/dL (ref 30.0–36.0)
Platelets: 192 10*3/uL (ref 150–400)
RDW: 14.9 % (ref 11.5–15.5)

## 2010-09-27 LAB — HEMOGLOBIN AND HEMATOCRIT, BLOOD
HCT: 29.2 % — ABNORMAL LOW (ref 39.0–52.0)
Hemoglobin: 9.9 g/dL — ABNORMAL LOW (ref 13.0–17.0)

## 2010-09-27 MED ORDER — SODIUM CHLORIDE 0.9 % IJ SOLN
INTRAMUSCULAR | Status: AC
  Administered 2010-09-27: 10 mL
  Filled 2010-09-27: qty 10

## 2010-09-27 MED ORDER — VITAMIN K1 10 MG/ML IJ SOLN: 10.0000 mg | Freq: Once | INTRAMUSCULAR | Status: DC

## 2010-09-27 MED ORDER — OXYCODONE-ACETAMINOPHEN 5-325 MG PO TABS
1.0000 | ORAL_TABLET | ORAL | Status: DC | PRN
  Administered 2010-09-27 – 2010-09-28 (×5): 2 via ORAL
  Filled 2010-09-27 (×6): qty 2

## 2010-09-27 MED ORDER — SODIUM CHLORIDE 0.9 % IJ SOLN
INTRAMUSCULAR | Status: AC
  Administered 2010-09-27: 10 mL via INTRAVENOUS
  Filled 2010-09-27: qty 10

## 2010-09-27 MED ORDER — HYDROMORPHONE HCL 1 MG/ML IJ SOLN
1.0000 mg | INTRAMUSCULAR | Status: DC | PRN
  Administered 2010-09-27 – 2010-09-28 (×5): 1 mg via INTRAVENOUS
  Filled 2010-09-27 (×5): qty 1

## 2010-09-27 MED ORDER — PHYTONADIONE 10 MG/ML INJECTION: 10.0000 mg | Freq: Once | INTRAMUSCULAR | Status: DC

## 2010-09-27 MED ORDER — PANTOPRAZOLE SODIUM 40 MG IV SOLR
40.0000 mg | INTRAVENOUS | Status: DC
  Administered 2010-09-28 – 2010-10-01 (×4): 40 mg via INTRAVENOUS
  Filled 2010-09-27 (×5): qty 40

## 2010-09-27 MED ORDER — HYDROMORPHONE HCL 1 MG/ML IJ SOLN
1.0000 mg | INTRAMUSCULAR | Status: DC | PRN
  Administered 2010-09-27: 1 mg via INTRAVENOUS
  Filled 2010-09-27: qty 1

## 2010-09-27 NOTE — Progress Notes (Signed)
Subjective: Patient feeling better, no abd pain, no nausea/vomiting.  Just tired, a little weak.  Objective: Vital signs in last 24 hours: Temp:  [97.8 F (36.6 C)-99.1 F (37.3 C)] 98.8 F (37.1 C) (07/08 0800) Pulse Rate:  [73-113] 73  (07/08 0800) Resp:  [10-20] 12  (07/08 0800) BP: (97-145)/(63-99) 113/70 mmHg (07/08 0800) SpO2:  [94 %-100 %] 99 % (07/08 0836) Weight:  [53.978 kg (119 lb)-56.8 kg (125 lb 3.5 oz)] 125 lb 3.5 oz (56.8 kg) (07/08 0500) Weight change:   Head: Normocephalic, without obvious abnormality, atraumatic Resp: clear to auscultation bilaterally Chest wall: no tenderness, noted port-a-cath. Cardio: regular rate and rhythm, S1, S2 normal, no murmur, click, rub or gallop GI: soft, non-tender; bowel sounds normal; no masses,  no organomegaly Extremities: extremities normal, atraumatic, no cyanosis or edema Pulses: 2+ and symmetric Skin: Skin color, texture, turgor normal. No rashes or lesions  Lab Results: Results for orders placed during the hospital encounter of 09/26/10  CBC      Component Value Range   WBC 3.2 (*) 4.0 - 10.5 (K/uL)   RBC 3.39 (*) 4.22 - 5.81 (MIL/uL)   Hemoglobin 9.4 (*) 13.0 - 17.0 (g/dL)   HCT 16.1 (*) 09.6 - 52.0 (%)   MCV 82.3  78.0 - 100.0 (fL)                  Platelets 241  150 - 400 (K/uL)  DIFFERENTIAL      Component Value Range                                                         COMPREHENSIVE METABOLIC PANEL      Component Value Range   Sodium 133 (*) 135 - 145 (mEq/L)   Potassium 4.3  3.5 - 5.1 (mEq/L)   Chloride 102  96 - 112 (mEq/L)   CO2 21  19 - 32 (mEq/L)   Glucose, Bld 80  70 - 99 (mg/dL)   BUN 8  6 - 23 (mg/dL)   Creatinine, Ser 0.45  0.50 - 1.35 (mg/dL)   Calcium 8.5  8.4 - 40.9 (mg/dL)   Total Protein 6.2  6.0 - 8.3 (g/dL)   Albumin 3.3 (*) 3.5 - 5.2 (g/dL)   AST 17  0 - 37 (U/L)   ALT 9  0 - 53 (U/L)   Alkaline Phosphatase 85  39 - 117 (U/L)   Total Bilirubin 0.2 (*) 0.3 - 1.2 (mg/dL)     GFR calc non Af Amer >60  >60 (mL/min)   GFR calc Af Amer >60  >60 (mL/min)  PROTIME-INR      Component Value Range   Prothrombin Time 57.9 (*) 11.6 - 15.2 (seconds)   INR 6.52 (*) 0.00 - 1.49   APTT      Component Value Range   aPTT 59 (*) 24 - 37 (seconds)  TYPE AND SCREEN      Component Value Range   ABO/RH(D) O POS     Antibody Screen NEG     Sample Expiration 09/29/2010     Unit Number [id number]     Blood Component Type RED CELLS,LR     Unit division 00     Status of Unit ALLOCATED     Transfusion Status OK TO TRANSFUSE  Crossmatch Result Compatible    URINALYSIS, ROUTINE W REFLEX MICROSCOPIC      Component Value Range   Color, Urine YELLOW  YELLOW    Appearance CLEAR  CLEAR    Specific Gravity, Urine >1.030 (*) 1.005 - 1.030    pH 5.5  5.0 - 8.0    Glucose, UA NEGATIVE  NEGATIVE (mg/dL)   Hgb urine dipstick NEGATIVE  NEGATIVE    Bilirubin Urine NEGATIVE  NEGATIVE    Ketones NEGATIVE  NEGATIVE (mg/dL)   Protein NEGATIVE  NEGATIVE (mg/dL)   Urobilinogen, UA 0.2  0.0 - 1.0 (mg/dL)   Nitrite NEGATIVE  NEGATIVE    Leukocytes, UA NEGATIVE  NEGATIVE   POCT I-STAT, CHEM 8      Component Value Range   Sodium 136  135 - 145 (mEq/L)   Potassium 4.2  3.5 - 5.1 (mEq/L)   Chloride 108  96 - 112 (mEq/L)   BUN 6  6 - 23 (mg/dL)   Creatinine, Ser 1.61  0.50 - 1.35 (mg/dL)   Glucose, Bld 77  70 - 99 (mg/dL)   Calcium, Ion 0.96  0.45 - 1.32 (mmol/L)   TCO2 17  0 - 100 (mmol/L)   Hemoglobin 9.9 (*) 13.0 - 17.0 (g/dL)   HCT 40.9 (*) 81.1 - 52.0 (%)  PREPARE FRESH FROZEN PLASMA      Component Value Range   Unit Number [id number]     Blood Component Type THAWED PLASMA     Unit division 00     Status of Unit ISSUED,FINAL     Transfusion Status OK TO TRANSFUSE    PREPARE RBC (CROSSMATCH)      Component Value Range   Order Confirmation ORDER PROCESSED BY BLOOD BANK    PROTIME-INR      Component Value Range   Prothrombin Time 22.6 (*) 11.6 - 15.2 (seconds)   INR 1.95 (*)  0.00 - 1.49   CBC      Component Value Range   WBC 3.6 (*) 4.0 - 10.5 (K/uL)   RBC 3.21 (*) 4.22 - 5.81 (MIL/uL)   Hemoglobin 8.9 (*) 13.0 - 17.0 (g/dL)   HCT 21.3 (*) 08.6 - 52.0 (%)   MCV 82.2  78.0 - 100.0 (fL)   MCH 27.7  26.0 - 34.0 (pg)   MCHC 33.7  30.0 - 36.0 (g/dL)   RDW 57.8  46.9 - 62.9 (%)   Platelets 204  150 - 400 (K/uL)  MRSA PCR SCREENING      Component Value Range   MRSA by PCR NEGATIVE  NEGATIVE   CBC      Component Value Range   WBC 3.7 (*) 4.0 - 10.5 (K/uL)   RBC 3.30 (*) 4.22 - 5.81 (MIL/uL)   Hemoglobin 9.0 (*) 13.0 - 17.0 (g/dL)   HCT 52.8 (*) 41.3 - 52.0 (%)   MCV 82.4  78.0 - 100.0 (fL)   MCH 27.3  26.0 - 34.0 (pg)   MCHC 33.1  30.0 - 36.0 (g/dL)   RDW 24.4  01.0 - 27.2 (%)   Platelets 192  150 - 400 (K/uL)  PROTIME-INR      Component Value Range   Prothrombin Time 18.8 (*) 11.6 - 15.2 (seconds)   INR 1.54 (*) 0.00 - 1.49   RETICULOCYTES      Component Value Range   Retic Ct Pct 0.9  0.4 - 3.1 (%)   RBC. 3.30 (*) 4.22 - 5.81 (MIL/uL)   Retic Count, Manual 29.7  19.0 -  186.0 (K/uL)  BASIC METABOLIC PANEL      Component Value Range   Sodium 133 (*) 135 - 145 (mEq/L)   Potassium 4.5  3.5 - 5.1 (mEq/L)   Chloride 105  96 - 112 (mEq/L)   CO2 22  19 - 32 (mEq/L)   Glucose, Bld 83  70 - 99 (mg/dL)   BUN 7  6 - 23 (mg/dL)   Creatinine, Ser 4.54  0.50 - 1.35 (mg/dL)   Calcium 8.3 (*) 8.4 - 10.5 (mg/dL)   GFR calc non Af Amer >60  >60 (mL/min)   GFR calc Af Amer >60  >60 (mL/min)  GLUCOSE, CAPILLARY      Component Value Range   Glucose-Capillary 80  70 - 99 (mg/dL)   Comment 1 Notify RN     Comment 2 Documented in Chart      Studies/Results: Dg Chest Portable 1 View  09/26/2010  *RADIOLOGY REPORT*  Clinical Data: Vomiting blood  PORTABLE CHEST - 1 VIEW  Comparison: 09/04/2010  Findings: Cardiomediastinal silhouette is stable.  Left subclavian Port-A-Cath is unchanged in position.  No acute infiltrate or pleural effusion.  No pulmonary edema.   IMPRESSION: No active disease.  No significant change.  Original Report Authenticated By: Natasha Mead, M.D.   Acute Abdominal Series  09/27/2010  *RADIOLOGY REPORT*  Clinical Data: Nausea and vomiting  ACUTE ABDOMEN SERIES (ABDOMEN 2 VIEW & CHEST 1 VIEW)  Comparison: Chest radiograph 09/26/2010, upper GI 09/21/1998 of  Findings: Left port in place.  Lungs are hyperinflated.  No pneumothorax.  No free air beneath hemidiaphragms.  Supine film demonstrates what appears to be dilated loops of small bowel in the central lower abdomen centrally.  There is normal caliber ascending colon which is gas-filled.  There is gas within the descending colon and proximal sigmoid colon.  No clear gas within the rectum.  Upright exam demonstrates a fluid level in the transverse colon.   IMPRESSION:  1. 1.  Dilated loops of small bowel representing mechanical obstruction or ileus. 2.  There is gas throughout the colon.  No clear at gas in the rectum. 3.  No evidence intraperitoneal free air.  Original Report Authenticated By: Genevive Bi, M.D.   Dg Kayleen Memos W/small Bowel High Density Medications:  Scheduled:   . sodium chloride  999 mL Intravenous Once  . HYDROmorphone      . ondansetron  4 mg Intravenous Once  . pantoprazole (PROTONIX) IV  40 mg Intravenous Q12H  . pantoprazole (PROTONIX) IV  40 mg Intravenous Once  . sodium chloride      . sodium chloride      . DISCONTD: phytonadione (VITAMIN K) IV  10 mg Intravenous Once  . DISCONTD: phytonadione  10 mg Intravenous Once  . DISCONTD: phytonadione  10 mg Intravenous Once    Assessment/Plan: Principal Problem:  *Bleeding gastrointestinal-looks to be a bit more stabilized.  BP ok, no tachycardia.  Monitor, get out of ICU, follow q12 H/H  Active Problems:  Supratherapeutic INR-corrected with FFP/Vit K  Abdominal  pain, other specified site-still some pain at times  Nausea and vomiting in adult  Anemia, chronic disease + acute blood loss anemia-stable for  now.   LOS: 1 day   KRISHNAN,SENDIL K 09/27/2010, 9:06 AM

## 2010-09-27 NOTE — Consults (Signed)
Gen surg/ Gi consult  GI bleed.  Patient is a 60 yo male well know to me with a hx of colon ca.  Recent resection and initiation of chemo.  Last treatment a couple weeks ago.  Acute onset of nausea and emesis yesterday.  Dark maroon in color.  Also had increased loose stool yesterday with dark stool.  No abd pain. No fevers or chills.  Nausea is slightly improved today.  PMH: Chronic pain, EtOH abuse, Colon CA, DVT, PE hx.  PSH: Ex lap, L hemicolectomy for CA.  Power port insertion.  Meds: Reviewed current meds.  Coumadin currently on hold.  All: NKDA  Soc: No current tob.  Denies current EtOH.  No rec Drug.  Prior heavy EtOH usage.  PE:   Vitals stable and reviewed.  NAD A/Ox3 PERRL, EOMI, no scleral icterus. No lymphadenopathy, trachea mid-line CTA, Unlarbored respirations RRR no murmurs.  Rad/fem pulses 2+ bilat Abd soft mild epigastric tenderness.  No peritoneal signs.  No masses.  Inc well healed. Ext warm and dry.  Hg 9.  INR 1.59 and down  A/P: GI bleed.  Likely secondary to anticoagulation.  Continue to hold anticoagulation for now.  No immediate surgical indications.  No immediate indications for endoscopy.  Agree with continuing BID PPI administration.  I will continue to follow patient during admission.  GI will be back tomorrow.

## 2010-09-27 NOTE — Progress Notes (Signed)
Pt transferred to tele floor. Pt alert, oriented and in stable condition at the time of transport. Pt transported in wheelchair via RN to room 335. Report given to Verdis Frederickson, RN at bedside. Kathyrn Sheriff [facility]

## 2010-09-28 ENCOUNTER — Encounter (HOSPITAL_COMMUNITY): Admission: EM | Disposition: A | Discharge status: Home / Self Care | Payer: Self-pay | Source: Home / Self Care

## 2010-09-28 ENCOUNTER — Encounter (HOSPITAL_COMMUNITY): Payer: Self-pay | Admitting: Urgent Care

## 2010-09-28 ENCOUNTER — Other Ambulatory Visit (HOSPITAL_COMMUNITY): Payer: Federal, State, Local not specified - PPO

## 2010-09-28 ENCOUNTER — Inpatient Hospital Stay (HOSPITAL_COMMUNITY): Payer: Federal, State, Local not specified - PPO

## 2010-09-28 DIAGNOSIS — Z9889 Other specified postprocedural states: Secondary | ICD-10-CM

## 2010-09-28 DIAGNOSIS — K208 Other esophagitis without bleeding: Secondary | ICD-10-CM

## 2010-09-28 DIAGNOSIS — K2100 Gastro-esophageal reflux disease with esophagitis, without bleeding: Secondary | ICD-10-CM

## 2010-09-28 DIAGNOSIS — K920 Hematemesis: Secondary | ICD-10-CM

## 2010-09-28 DIAGNOSIS — Z85038 Personal history of other malignant neoplasm of large intestine: Secondary | ICD-10-CM

## 2010-09-28 HISTORY — PX: ESOPHAGOGASTRODUODENOSCOPY: SHX5428

## 2010-09-28 HISTORY — DX: Other specified postprocedural states: Z98.890

## 2010-09-28 LAB — HEMOGLOBIN AND HEMATOCRIT, BLOOD
HCT: 30.8 % — ABNORMAL LOW (ref 39.0–52.0)
Hemoglobin: 10.4 g/dL — ABNORMAL LOW (ref 13.0–17.0)

## 2010-09-28 LAB — PREPARE FRESH FROZEN PLASMA

## 2010-09-28 SURGERY — EGD (ESOPHAGOGASTRODUODENOSCOPY)
Anesthesia: Moderate Sedation

## 2010-09-28 SURGERY — DILATION, ESOPHAGUS, USING MALONEY DILATOR
Anesthesia: Moderate Sedation

## 2010-09-28 MED ORDER — SODIUM CHLORIDE 0.9 % IJ SOLN
INTRAMUSCULAR | Status: AC
  Administered 2010-09-28: 10 mL via INTRAVENOUS
  Filled 2010-09-28: qty 10

## 2010-09-28 MED ORDER — MIDAZOLAM HCL 5 MG/5ML IJ SOLN
INTRAMUSCULAR | Status: AC
  Filled 2010-09-28: qty 10

## 2010-09-28 MED ORDER — SODIUM CHLORIDE 0.9 % IJ SOLN
INTRAMUSCULAR | Status: AC
  Administered 2010-09-28: 10 mL
  Filled 2010-09-28: qty 10

## 2010-09-28 MED ORDER — BUTAMBEN-TETRACAINE-BENZOCAINE 2-2-14 % EX AERO
INHALATION_SPRAY | CUTANEOUS | Status: DC | PRN
  Administered 2010-09-28: 2 via TOPICAL

## 2010-09-28 MED ORDER — MIDAZOLAM HCL 5 MG/5ML IJ SOLN
INTRAMUSCULAR | Status: DC | PRN
  Administered 2010-09-28: 1 mg via INTRAVENOUS
  Administered 2010-09-28: 2 mg via INTRAVENOUS

## 2010-09-28 MED ORDER — HYDROCODONE-ACETAMINOPHEN 5-325 MG PO TABS
1.0000 | ORAL_TABLET | Freq: Four times a day (QID) | ORAL | Status: DC | PRN
  Administered 2010-09-28 – 2010-09-30 (×7): 2 via ORAL
  Filled 2010-09-28 (×9): qty 2

## 2010-09-28 MED ORDER — MEPERIDINE HCL 25 MG/ML IJ SOLN
INTRAMUSCULAR | Status: DC | PRN
  Administered 2010-09-28: 50 mg via INTRAVENOUS

## 2010-09-28 MED ORDER — MORPHINE SULFATE 4 MG/ML IJ SOLN
4.0000 mg | INTRAMUSCULAR | Status: DC | PRN
  Administered 2010-09-28 – 2010-10-01 (×14): 4 mg via INTRAVENOUS
  Filled 2010-09-28 (×16): qty 1

## 2010-09-28 MED ORDER — MEPERIDINE HCL 100 MG/ML IJ SOLN
INTRAMUSCULAR | Status: AC
  Administered 2010-09-28: 17:00:00
  Filled 2010-09-28: qty 2

## 2010-09-28 MED ORDER — SODIUM CHLORIDE 0.45 % IV SOLN
Freq: Once | INTRAVENOUS | Status: AC
  Administered 2010-09-28: 15:00:00 via INTRAVENOUS

## 2010-09-28 NOTE — Op Note (Signed)
[name], [name]              ACCOUNT NO.:  [id number]  MEDICAL RECORD NO.:  [id number]  LOCATION:  A335                          FACILITY:  [facility]  PHYSICIAN:  R. Roetta Sessions, MD FACP FACGDATE OF BIRTH:  07/27/1950  DATE OF PROCEDURE: DATE OF DISCHARGE:                              OPERATIVE REPORT   INDICATIONS FOR PROCEDURE:  A 60 year old gentleman with recent hematemesis and coagulopathy.  INR is normalized with sedation of Coumadin therapy remained hemodynamically stable.  EGD is now being done.  Risks, benefits, limitations, alternatives, imponderables have been discussed, questions answered.  Please see documentation medical record.  PROCEDURE NOTE:  O2 saturation, blood pressure, pulse, respirations are monitored throughout the entirety of procedure.  CONSCIOUS SEDATION:  Versed 3 mg IV Demerol 50 mg IV in divided doses. Cetacaine spray for topic pharyngeal anesthesia.  INSTRUMENT:  Pentax video chip system.  FINDINGS:  Examination of tubular esophagus revealed distal esophageal erosions straddling the EG junction.  This tubular esophagus otherwise appeared normal was widely patent through the EG junction.  Stomach: Gas cavity was surgically altered previously.  Configuration consistent with Billroth I type hemigastrectomy I found I efferent limb, slight erythema of the gastric anastomosis on the gastric side.  Small hiatal hernia was seen on retroflexion.  Remainder of gastric mucosa appeared normal.  There was no ulcer infiltrating process.  The Ferd Glassing and was intubated to 20 cm segment GI tract appeared normal.  THERAPEUTIC/DIAGNOSTIC MANEUVERS PERFORMED:  None.  The patient tolerated the procedure well and was reactive colonoscopy.  IMPRESSION: 1. Distal esophageal erosion consistent with erosive reflux     esophagitis, otherwise normal esophagus. 2. Status post Billroth I type hemigastrectomy, mild erythema of the     gastric mucosal remnant otherwise  normal-appearing gastric mucosa     site with small hiatal hernia, patent efferently on     recommendations. 3. Advance diet as tolerated. 4. Continue PPI once daily. 5. We would reevaluate the need for resuming Coumadin. 6. From a GI standpoint, hopefully he would be able to go home soon.     Jonathon Bellows, MD FACP [facility]     RMR/MEDQ  D:  09/28/2010  T:  09/28/2010  Job:  [id number]  cc:   Ladona Horns. Mariel Sleet, MD Fax: [phone]

## 2010-09-28 NOTE — Progress Notes (Signed)
  Subjective: Comfortable.  No abd pain.  Feels better.  Objective: Vital signs in last 24 hours: Temp:  [98.1 F (36.7 C)-98.8 F (37.1 C)] 98.3 F (36.8 C) (07/09 0536) Pulse Rate:  [67-99] 99  (07/09 0848) Resp:  [14-20] 14  (07/09 0848) BP: (125-141)/(78-84) 141/82 mmHg (07/09 0536) SpO2:  [95 %-100 %] 97 % (07/09 0848) Last BM Date: 09/26/10  Intake/Output from previous day: 07/08 0701 - 07/09 0700 In: 3022 [P.O.:2210; I.V.:812] Out: 4000 [Urine:4000] Intake/Output this shift: I/O this shift: In: 500 [P.O.:500] Out: -   General appearance: alert, cooperative and no distress GI: soft, non-tender; bowel sounds normal; no masses,  no organomegaly  Lab Results:  @LABLAST2 (wbc:2,hgb:2,hct:2,plt:2) BMET  Basename 09/27/10 0531 09/26/10 1704 09/26/10 1640  NA 133* 136 --  K 4.5 4.2 --  CL 105 108 --  CO2 22 -- 21  GLUCOSE 83 77 --  BUN 7 6 --  CREATININE 0.78 0.80 --  CALCIUM 8.3* -- 8.5   PT/INR  Basename 09/27/10 0530 09/26/10 2242  LABPROT 18.8* 22.6*  INR 1.54* 1.95*   ABG No results found for this basename: PHART:2,PCO2:2,PO2:2,HCO3:2 in the last 72 hours  Studies/Results: No results found.  Anti-infectives: Anti-infectives    None      Assessment/Plan: s/p  GI bleed.  Doing better.  Feel likely due to anticoagulation.  No acute surgical indications.  Available as needed.  LOS: 2 days    Marvie Calender C 09/28/2010

## 2010-09-28 NOTE — Progress Notes (Signed)
INITIAL ADULT NUTRITION ASSESSMENT Date: 09/28/2010   Time: 12:19 PM Reason for Assessment: Poor po intake, Adult unintentional weight loss (>10#/month), diet education  ASSESSMENT: Male 60 y.o.  Dx: Bleeding gastrointestinal  Hx: adenocarcinoma of colon stage 3, pulmonary embolism, acid reflux, HTN, osteoporosis, SOB, arthritis Related Meds: Oxycodone-acetaminophen, pantoprazole, ondansetron HCL, lorazepam, hydromorphone, acetaminophen  Ht: 5\' 9"  (175.3 cm)  Wt: 56.8 kg (125 lb 3.5 oz)  Ideal Wt: 66.2 kg 70.7 kg % Ideal Wt: 74%  Usual Wt: 140# % Usual Wt: 85%  Body mass index is 18.49 kg/(m^2).  Food/Nutrition Related Hx: Pt with 21# wt loss x 2 months, due to nausea and decreased appetite. Recently started on chemotherapy. Pt follows a regular diet at home and take 1-2 bottles of Ensure daily as a supplement. Noted pt also prescribed Megace in the outpatient setting.  Labs: Na: 133, K: 4.3, Cl: 102, Bicarb: 21, BUN: 8, Creat: 0.84, Albumin: 3.3, Glucose: 80  Intake: 2022 ml Output: 2250 ml  Diet Order: Clear Liquid  Supplements/Tube Feeding: None at this time  IVF:    Estimated Nutritional Needs:   Kcal:1719-2001 kcals/day Protein:96-86 grams protein/day Fluid:1/7-2.0 L fluid/day  NUTRITION DIAGNOSIS: -Inadequate oral intake (NI-2.1).  Status: Ongoing Inadequate oral intake r/t involuntary wt loss, changes in appetite AEB 15% wt loss x 2 months. MONITORING/EVALUATION(Goals): 1) Pt will meet greater than or equal to 75% of estimate needs via PO intake 2) Pt will maintain wt of 119#  INTERVENTION: Education needs addressed. Will monitor advancement of diet as medically indicated and diet tolerance. Consider adding Ensure supplement with diet advancement.  Dietitian #:  DOCUMENTATION CODES Per approved criteria  -Non-severe (moderate) malnutrition in the context of acute illness or injury -Underweight    Kayan, Narayan Scull Aileen 09/28/2010, 12:19 PM

## 2010-09-28 NOTE — Brief Op Note (Signed)
09/26/2010 - 09/28/2010  4:50 PM  PATIENT:  [name]  60 y.o. male  PRE-OPERATIVE DIAGNOSIS:  hematemesis, coagulopathy    EGD findings:  Distal esophageal erosions; no stricture S/ P BI type hemigastrectomy ; patent efferent limb; no ulcer  Suggest:  Advance diet as tolerated Continue daily PPI Would re-evaluate need to resume anticoagulation therapy

## 2010-09-28 NOTE — Consult Note (Signed)
CSW received referral along with chaplain for [facility].  Chaplain to complete HCPOA with pt today.  CSW to sign off unless further needs arise. Karn Cassis

## 2010-09-28 NOTE — Consults (Signed)
Referring Provider:  [facility] Team 3 Primary Care Physician:  [name], MD, MD Primary Gastroenterologist:  Dr. Jena Gauss  Reason for Consultation:  Hematemesis, hx colon ca  HPI:  [name] is a 60 y.o. male referred by Hospitalist Team for hematemesis.  2 days ago, began vomiting dark burgundy blood in small amts x3.  C/o pain left of incision.  On coumadin.  Was on hold due to coagulopathy per Dr Ignacia Palma.  Pt believes INR was 8 outpatient.  Denies any OTC NSAIDs.  Denies hx heartburn, indigestion, nausea or vomiting.  Wt loss 20# in 2 mo.  S/p 1 treatment chemo 3 weeks ago Dr Mariel Sleet (supposed to have #2 today) for colon ca.  C/o dysphagia w/ solids & liquids 1 week ago felt like food would get stuck in upper esophagus.  Denies regurgitation.  EGD 4 yrs ago_Dr Pandya-pt believes normal.  C/o loose stools 0-5 per day.  Denies rectal bleeding or melena.  Appetite ok.  Hgb 10.4.  Past Medical History  Diagnosis Date  . 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 Pulmonary embolism 02/2010  . Acid reflux   . Hypertension   . Osteoporosis   . Arthritis   . TIA (transient ischemic attack) 10/11  . ETOH abuse     quit 03/2010   Past Surgical History  Procedure Date  . Abdominal surgery   . Hernia repair   . Appendectomy   . Cholecystectomy   . Colon surgery     left  . Portacath placement   . Abdominal sugery     for bowel obstruction x 8  . Colon surgery 07/31/10    Medications Prior to Admission: Prior to Admission medications   Medication Sig Start Date End Date Taking? Authorizing Provider  CYANOCOBALAMIN IJ Inject 1,000 mcg as directed every 30 (thirty) days.     Yes Historical Provider, MD  folic acid (FOLVITE) 1 MG tablet Take 1 mg by mouth daily.     Yes Historical Provider, MD  HYDROcodone-acetaminophen (NORCO) 5-325 MG per tablet Take 1 tablet by mouth every 6 (six) hours as needed.     Yes Historical Provider, MD  LORazepam (ATIVAN) 1 MG  tablet Take 1 mg by mouth every 4 (four) hours as needed.     Yes Historical Provider, MD  megestrol (MEGACE) 40 MG/ML suspension Take 200 mg by mouth 2 (two) times daily.     Yes Historical Provider, MD  olmesartan (BENICAR) 5 MG tablet Take 5 mg by mouth daily.     Yes Historical Provider, MD  omeprazole (PRILOSEC) 20 MG capsule Take 20 mg by mouth daily.     Yes Historical Provider, MD  Ondansetron HCl (ZOFRAN PO) Take 8 mg by mouth 3 (three) times daily as needed.     Yes Historical Provider, MD  prochlorperazine (COMPAZINE) 10 MG tablet Take 10 mg by mouth every 6 (six) hours as needed.     Yes Historical Provider, MD  thiamine 100 MG tablet Take 100 mg by mouth daily.     Yes Historical Provider, MD  WARFARIN SODIUM PO Take 8 mg by mouth 1 day or 1 dose.     Yes Historical Provider, MD  Cyclobenzaprine HCl (FLEXERIL PO) Take 10 mg by mouth 3 (three) times daily as needed.      Historical Provider, MD   Current Facility-Administered Medications  Medication Dose Route Frequency Provider Last Rate Last Dose  . acetaminophen (TYLENOL)  tablet 650 mg  650 mg Oral Q6H PRN Gokul Krishnan   650 mg at 09/26/10 2138   Or  . acetaminophen (TYLENOL) suppository 650 mg  650 mg Rectal Q6H PRN Osvaldo Shipper      . HYDROmorphone (DILAUDID) injection 1 mg  1 mg Intravenous Q4H PRN Fabio Bering   1 mg at 09/28/10 0931  . LORazepam (ATIVAN) tablet 1 mg  1 mg Oral Q4H PRN Osvaldo Shipper      . ondansetron (ZOFRAN) injection 4 mg  4 mg Intravenous Q6H PRN Osvaldo Shipper      . oxyCODONE-acetaminophen (PERCOCET) 5-325 MG per tablet 1-2 tablet  1-2 tablet Oral Q4H PRN Fabio Bering   2 tablet at 09/28/10 0754  . pantoprazole (PROTONIX) injection 40 mg  40 mg Intravenous Q24H Sendil K Krishnan      . promethazine (PHENERGAN) injection 12.5 mg  12.5 mg Intravenous Q6H PRN Gokul Krishnan   12.5 mg at 09/27/10 0826  . sodium chloride 0.9 % injection        10 mL at 09/27/10 1730  . sodium chloride 0.9 %  injection        10 mL at 09/28/10 0830  . sodium chloride 0.9 % injection        10 mL at 09/28/10 0931  . DISCONTD: albuterol (PROVENTIL) (2.5 MG/3ML) 0.083% nebulizer solution 2.5 mg  2.5 mg Nebulization Q2H PRN Gokul Krishnan      . DISCONTD: HYDROmorphone (DILAUDID) injection 1 mg  1 mg Intravenous Q3H PRN Sendil K Krishnan   1 mg at 09/27/10 1245   Allergies as of 09/26/2010  . (No Known Allergies)   Family History:There is no known family history of colorectal carcinoma , liver disease, or inflammatory bowel disease.  Problem Relation Age of Onset  . Hypertension Mother   . Hypertension Father    History   Social History  . Marital Status: Married    Spouse Name: N/A    Number of Children: N/A  . Years of Education: N/A   Occupational History  . Not on file.   Social History Main Topics  . Smoking status: Current Everyday Smoker -- 0.5 packs/day for 40 years    Types: Cigarettes  . Smokeless tobacco: Not on file  . Alcohol Use: No  . Drug Use: No  . Sexually Active: Yes   Review of Systems: ZOX:WRUE fatigue. See HPI CV: Denies chest pain, angina, palpitations, syncope, orthopnea, PND, peripheral edema, and claudication. Resp: Denies dyspnea at rest, dyspnea with exercise, cough, sputum, wheezing, coughing up blood, and pleurisy. GI: Denies vomiting blood, jaundice, and fecal incontinence.  GU : Denies urinary burning, blood in urine, urinary frequency, urinary hesitancy, nocturnal urination, and urinary incontinence. MS: Denies joint pain, limitation of movement, and swelling, stiffness, low back pain, extremity pain. Denies muscle weakness, cramps, atrophy.  Derm: Denies rash, itching, dry skin, hives, moles, warts, or unhealing ulcers.  Psych: Denies depression, anxiety, memory loss, suicidal ideation, hallucinations, paranoia, and confusion. Heme: Denies bruising and enlarged lymph nodes.  Physical Exam: Vital signs in last 24 hours: Temp:  [98.1 F (36.7  C)-98.8 F (37.1 C)] 98.3 F (36.8 C) (07/09 0536) Pulse Rate:  [67-99] 99  (07/09 0848) Resp:  [14-20] 14  (07/09 0848) BP: (125-141)/(78-84) 141/82 mmHg (07/09 0536) SpO2:  [95 %-100 %] 97 % (07/09 0848) Last BM Date: 09/26/10 General:   Alert,  Well-developed, well-nourished, pleasant and cooperative in NAD Head:  Normocephalic and atraumatic. Eyes:  Sclera clear, no icterus.   Conjunctiva pink. Ears:  Normal auditory acuity. Nose:  No deformity, discharge,  or lesions. Mouth:  No deformity or lesions, dentition poor. Neck:  Supple; no masses or thyromegaly. Lungs:  Clear throughout to auscultation.   No wheezes, crackles, or rhonchi. No acute distress. Heart:  Regular rate and rhythm; no murmurs, clicks, rubs,  or gallops.  PAC left ant chest-site clear. Abdomen:  Well-healing midline vertical scar.  Soft, nontender and nondistended. No masses, hepatosplenomegaly or hernias noted. Normal bowel sounds, without guarding, and without rebound.   Msk:  Symmetrical without gross deformities. Normal posture. Pulses:  Normal pulses noted. Extremities:  Without  edema. Neurologic:  Alert and  oriented x4;  grossly normal neurologically. Skin:  Intact without significant lesions or rashes. Cervical Nodes:  No significant cervical adenopathy. Psych:  Alert and cooperative. Normal mood and affect.  Intake/Output from previous day: 07/08 0701 - 07/09 0700 In: 3022 [P.O.:2210; I.V.:812] Out: 4000 [Urine:4000] Intake/Output this shift: I/O this shift: In: 500 [P.O.:500] Out: -   Lab Results:  Basename 09/28/10 0821 09/27/10 2033 09/27/10 1717 09/27/10 0530 09/26/10 2242 09/26/10 1640  WBC -- -- -- 3.7* 3.6* 3.2*  HGB 10.4* 9.9* 9.8* -- -- --  HCT 30.8* 29.2* 29.7* -- -- --  PLT -- -- -- 192 204 241   BMET  Basename 09/27/10 0531 09/26/10 1704 09/26/10 1640  NA 133* 136 133*  K 4.5 4.2 4.3  CL 105 108 102  CO2 22 -- 21  GLUCOSE 83 77 80  BUN 7 6 8   CREATININE 0.78 0.80 0.84    CALCIUM 8.3* -- 8.5   LFT  Basename 09/26/10 1640  PROT 6.2  ALBUMIN 3.3*  AST 17  ALT 9  ALKPHOS 85  BILITOT 0.2*  BILIDIR --  IBILI --   PT/INR  Basename 09/27/10 0530 09/26/10 2242  LABPROT 18.8* 22.6*  INR 1.54* 1.95*   Impression: [name] is a 60 y.o. caucasian male w/ hx colon ca dx 07/2010 s/p left hemicolectomy & 1st chemotherapy treatment 3 wks ago.  2 days ago, pt developed hematemesis in the setting of coagulopathy.  He also has had dysphagia w/ both solids & liquids.  Differentials include PUD, gastritis, ulcerative/erosive esophagitis, or Mallory Weiss tear.  Differentials for dysphagia include esophageal web, ring, stricture & less likely malignancy. EGD by Dr Jena Gauss today.  I have discussed risks & benefits which include, but are not limited to, bleeding, infection, perforation & drug reaction.  The patient agrees with this plan & written consent will be obtained.    Plan: EGD today NPO Obtain consent Continue to monitor H/H  Agree w/ Protonix 40mg  daily   LOS: 2 days   Lorenza Burton  09/28/2010, 11:51 AM

## 2010-09-28 NOTE — Progress Notes (Signed)
Subjective: Feeling better, still occasional episodes of pain.  No shortness of breath, no further bleeding episodes.  Objective: Vital signs in last 24 hours: Temp:  [98.1 F (36.7 C)-98.8 F (37.1 C)] 98.3 F (36.8 C) (07/09 0536) Pulse Rate:  [67-99] 99  (07/09 0848) Resp:  [14-20] 14  (07/09 0848) BP: (125-141)/(78-84) 141/82 mmHg (07/09 0536) SpO2:  [95 %-100 %] 97 % (07/09 0848) Weight change:   General appearance: alert, cooperative, appears stated age and no distress Head: Normocephalic, without obvious abnormality, atraumatic Resp: clear to auscultation bilaterally Chest wall: no tenderness Cardio: regular rate and rhythm, S1, S2 normal, no murmur, click, rub or gallop GI: soft, non-tender; bowel sounds normal; no masses,  no organomegaly Extremities: extremities normal, atraumatic, no cyanosis or edema Pulses: 2+ and symmetric Skin: Skin color, texture, turgor normal. No rashes or lesions  Lab Results:  Basename 09/27/10 0531 09/26/10 1704 09/26/10 1640  NA 133* 136 --  K 4.5 4.2 --  CL 105 108 --  CO2 22 -- 21  GLUCOSE 83 77 --  BUN 7 6 --  CREATININE 0.78 0.80 --  CALCIUM 8.3* -- 8.5    Basename 09/28/10 0821 09/27/10 2033 09/27/10 0530 09/26/10 2242  WBC -- -- 3.7* 3.6*  HGB 10.4* 9.9* -- --  HCT 30.8* 29.2* -- --  PLT -- -- 192 204    Studies/Results: Dg Chest Portable 1 View  09/26/2010  *RADIOLOGY REPORT*  Clinical Data: Vomiting blood  PORTABLE CHEST - 1 VIEW  Comparison: 09/04/2010  Findings: Cardiomediastinal silhouette is stable.  Left subclavian Port-A-Cath is unchanged in position.  No acute infiltrate or pleural effusion.  No pulmonary edema.  IMPRESSION: No active disease.  No significant change.  Original Report Authenticated By: Natasha Mead, M.D.   Dg Chest Portable 1 View  09/04/2010  *RADIOLOGY REPORT*  Clinical Data: Port-A-Cath placement, metastatic lung cancer  PORTABLE CHEST - 1 VIEW  Comparison: Portable exam 1009 hours compared to  08/14/2010  Findings: New left subclavian Port-A-Cath, tip projects over SVC. Normal heart size and pulmonary vascularity. Tortuous thoracic aorta. Emphysematous changes without infiltrate, pleural effusion or pneumothorax. Bones appear demineralized. Free air identified under right hemi diaphragm on previous exam no longer seen.  IMPRESSION: No pneumothorax following left subclavian Port-A-Cath placement.  Original Report Authenticated By: Lollie Marrow, M.D.   Acute Abdominal Series  09/27/2010  *RADIOLOGY REPORT*  Clinical Data: Nausea and vomiting  ACUTE ABDOMEN SERIES (ABDOMEN 2 VIEW & CHEST 1 VIEW)  Comparison: Chest radiograph 09/26/2010, upper GI 09/21/1998 of  Findings: Left port in place.  Lungs are hyperinflated.  No pneumothorax.  No free air beneath hemidiaphragms.  Supine film demonstrates what appears to be dilated loops of small bowel in the central lower abdomen centrally.  There is normal caliber ascending colon which is gas-filled.  There is gas within the descending colon and proximal sigmoid colon.  No clear gas within the rectum.  Upright exam demonstrates a fluid level in the transverse colon.   IMPRESSION:  1. 1.  Dilated loops of small bowel representing mechanical obstruction or ileus. 2.  There is gas throughout the colon.  No clear at gas in the rectum. 3.  No evidence intraperitoneal free air.  Original Report Authenticated By: Genevive Bi, M.D.   Dg Ugi W/small Bowel High Density  09/21/2010  *RADIOLOGY REPORT*  Clinical Data weight loss, difficulty swallowing; past history of carcinoma the colon post surgical resection; past history of pyloric mass with Bilroth I procedure  converted to Billroth, II per patient.  UPPER GI W/ SMALL BOWEL HIGH DENSITY  Technique: Upper GI series performed with high density barium and effervescent agent. Thin barium also used. Subsequently, serial images of the small bowel were obtained including spot views of the terminal ileum.  Fluoroscopy Time:   6.0 minutes  Contrast: High-density barium and air  Comparison: None Correlation:  CT abdomen and pelvis 08/14/2010  Findings: Normal bowel gas pattern on scout image. Surgical clips throughout abdomen. Normal esophageal distention and motility without mass or stricture. 12.5 mm diameter barium tablet passes from oral cavity stomach without delay. Targeted rapid sequence imaging of cervical esophagus and hypopharynx is unremarkable. No gastroesophageal reflux or hiatal hernia identified.  Patient is post antrectomy with patent gastrojejunostomy anastomosis, as seen on recent CT, compatible with Bilroth I. However, no patent gastrojejunostomy anastomosis is identified to correspond to the expected anatomy post Billroth II, despite multiple changes in patient position. A small bowel loop is seen extending cranially lateral to the stomach but direct communication between this loop and the stomach was not demonstrated. A gastrojejunostomy anastomosis is not identified on the recent CT.  Rugal folds of remaining stomach are normal in appearance without mass or ulceration. Duodenum has normal postoperative appearance with normal mucosal folds. Normal position of ligament of Treitz. Sequential images of small bowel reveal normal appearing jejunal and ileal loops. Contrast present in colon at 4 hours 5 minutes. Compression views of the terminal ileum are normal.  IMPRESSION: No esophageal mass or obstruction. Post Billroth I procedure with patent gastroduodenal anastomoses. Small bowel loop is identified adjacent to the gastric remnant in left upper quadrant but a patent gastrojejunostomy communication expected post Billroth II procedure could not be demonstrated during this exam. Unremarkable small bowel.  Original Report Authenticated By: Lollie Marrow, M.D.   Dg C-arm 1-60 Min-no Report  09/04/2010  CLINICAL DATA: port-a-cath placement   C-ARM 1-60 MINUTES  Fluoroscopy was utilized by the requesting physician.  No  radiographic  interpretation.      Medications:  Scheduled:   . pantoprazole (PROTONIX) IV  40 mg Intravenous Q24H  . sodium chloride      . sodium chloride      . sodium chloride        Assessment/Plan: Principal Problem:  *Bleeding gastrointestinal-stable for now, likely secondary to INR.  GI seeing, possible EGD vs outpatient followup. Active Problems:  Supratherapeutic INR-resolved, will d/c coumadin altogether.  Abdominal  pain, other specified site-stable.  Nausea and vomiting in adult-stable  Anemia, chronic disease   LOS: 2 days   [name] K 09/28/2010, 12:17 PM

## 2010-09-29 ENCOUNTER — Inpatient Hospital Stay (HOSPITAL_COMMUNITY): Payer: Federal, State, Local not specified - PPO

## 2010-09-29 ENCOUNTER — Encounter (HOSPITAL_COMMUNITY): Payer: Self-pay

## 2010-09-29 DIAGNOSIS — R509 Fever, unspecified: Secondary | ICD-10-CM | POA: Diagnosis not present

## 2010-09-29 LAB — CBC
Hemoglobin: 10.3 g/dL — ABNORMAL LOW (ref 13.0–17.0)
MCH: 28.1 pg (ref 26.0–34.0)
Platelets: 195 10*3/uL (ref 150–400)
RBC: 3.66 MIL/uL — ABNORMAL LOW (ref 4.22–5.81)
WBC: 2.9 10*3/uL — ABNORMAL LOW (ref 4.0–10.5)

## 2010-09-29 LAB — HEMOGLOBIN AND HEMATOCRIT, BLOOD
HCT: 31.2 % — ABNORMAL LOW (ref 39.0–52.0)
Hemoglobin: 10.3 g/dL — ABNORMAL LOW (ref 13.0–17.0)

## 2010-09-29 LAB — DIFFERENTIAL
Eosinophils Absolute: 0.1 10*3/uL (ref 0.0–0.7)
Lymphocytes Relative: 36 % (ref 12–46)
Lymphs Abs: 1 10*3/uL (ref 0.7–4.0)
Monocytes Relative: 27 % — ABNORMAL HIGH (ref 3–12)
Neutro Abs: 1 10*3/uL — ABNORMAL LOW (ref 1.7–7.7)
Neutrophils Relative %: 34 % — ABNORMAL LOW (ref 43–77)

## 2010-09-29 LAB — URINALYSIS, ROUTINE W REFLEX MICROSCOPIC
Glucose, UA: NEGATIVE mg/dL
Leukocytes, UA: NEGATIVE
Protein, ur: NEGATIVE mg/dL
Specific Gravity, Urine: 1.025 (ref 1.005–1.030)

## 2010-09-29 MED ORDER — SODIUM CHLORIDE 0.9 % IJ SOLN
INTRAMUSCULAR | Status: AC
  Administered 2010-09-29: 10:00:00
  Filled 2010-09-29: qty 10

## 2010-09-29 MED ORDER — MOXIFLOXACIN HCL IN NACL 400 MG/250ML IV SOLN
400.0000 mg | INTRAVENOUS | Status: DC
  Administered 2010-09-29 – 2010-09-30 (×2): 400 mg via INTRAVENOUS
  Filled 2010-09-29 (×3): qty 250

## 2010-09-29 MED ORDER — SODIUM CHLORIDE 0.9 % IJ SOLN
INTRAMUSCULAR | Status: AC
  Filled 2010-09-29: qty 10

## 2010-09-29 MED ORDER — ENSURE CLINICAL ST REVIGOR PO LIQD
237.0000 mL | Freq: Two times a day (BID) | ORAL | Status: DC
Start: 2010-09-29 — End: 2010-09-29
  Filled 2010-09-29 (×2): qty 237

## 2010-09-29 MED ORDER — SODIUM CHLORIDE 0.9 % IJ SOLN
INTRAMUSCULAR | Status: AC
  Administered 2010-09-29: 23:00:00
  Filled 2010-09-29: qty 10

## 2010-09-29 MED ORDER — ENSURE CLINICAL ST REVIGOR PO LIQD
237.0000 mL | Freq: Two times a day (BID) | ORAL | Status: DC
  Administered 2010-09-29 – 2010-10-01 (×5): 237 mL via ORAL
  Filled 2010-09-29 (×7): qty 237

## 2010-09-29 MED ORDER — HEPARIN SOD (PORK) LOCK FLUSH 100 UNIT/ML IV SOLN
500.0000 [IU] | Freq: Once | INTRAVENOUS | Status: DC
  Filled 2010-09-29: qty 5

## 2010-09-29 NOTE — Progress Notes (Signed)
  Subjective: Pt c/o chills.  Mild abd pain @ umbilicus, nausea.  No vomiting.  1 soft brown BM yesterday.  No rectal bleeding or melena.  Objective: Vital signs in last 24 hours: Temp:  TMax 100.2 now Pulse Rate:  [82-135] 94  (07/09 2100) Resp:  [13-26] 18  (07/09 2100) BP: (112-167)/(67-114) 144/80 mmHg (07/09 2100) SpO2:  [96 %-100 %] 98 % (07/09 2100) Last BM Date: 09/28/10 General:   Alert,  Well-developed, well-nourished, pleasant and cooperative in NAD Head:  Normocephalic and atraumatic. Eyes:  Sclera clear, no icterus.   Conjunctiva pink. Mouth:  No deformity or lesions, dentition normal. Neck:  Supple; no masses or thyromegaly. Heart:  Regular rate and rhythm; no murmurs, clicks, rubs,  or gallops. Abdomen:  Soft, nontender and nondistended. No masses, hepatosplenomegaly or hernias noted. Normal bowel sounds, without guarding, and without rebound.   Msk:  Symmetrical without gross deformities. Normal posture. Pulses:  Normal pulses noted. Extremities:  Without clubbing or edema. Neurologic:  Alert and  oriented x4;  grossly normal neurologically. Skin:  Intact without significant lesions or rashes. Cervical Nodes:  No significant cervical adenopathy. Psych:  Alert and cooperative. Normal mood and affect.  Intake/Output from previous day: 07/09 0701 - 07/10 0700 In: 500 [P.O.:500] Out: 1325 [Urine:1025; Stool:300] Intake/Output this shift: I/O this shift: In: 240 [P.O.:240] Out: -   Lab Results:  Basename 09/29/10 0603 09/28/10 0821 09/27/10 2033 09/27/10 0530 09/26/10 2242 09/26/10 1640  WBC -- -- -- 3.7* 3.6* 3.2*  HGB 10.3* 10.4* 9.9* -- -- --  HCT 31.2* 30.8* 29.2* -- -- --  PLT -- -- -- 192 204 241   BMET  Basename 09/27/10 0531 09/26/10 1704 09/26/10 1640  NA 133* 136 133*  K 4.5 4.2 4.3  CL 105 108 102  CO2 22 -- 21  GLUCOSE 83 77 80  BUN 7 6 8   CREATININE 0.78 0.80 0.84  CALCIUM 8.3* -- 8.5   LFT  Basename 09/26/10 1640  PROT 6.2  ALBUMIN  3.3*  AST 17  ALT 9  ALKPHOS 85  BILITOT 0.2*  BILIDIR --  IBILI --   PT/INR  Basename 09/27/10 0530 09/26/10 2242  LABPROT 18.8* 22.6*  INR 1.54* 1.95*   Assessment: Bleeding gastrointestinal in setting of Supratherapeutic INR:  Stable.  No evidence of upper GI bleed on EGD. Erosive Reflux esophagitis Nausea without vomiting Hx colon ca:  On chemo Anemia, chronic disease:  Stable Fever:  New problem.  ?etiology  Plan: Continue PPI indefinitely Work-up per hospitalist of fever FU oncology w/ hx colon ca   LOS: 3 days   [name]  09/29/2010, 9:32 AM

## 2010-09-29 NOTE — Progress Notes (Signed)
Stopped by several times, patient was resting each time.  Spoke with his nurse, Tamera Punt, who shared he had a fever, but had been quiet also.  Will continue to check with him.

## 2010-09-29 NOTE — Progress Notes (Signed)
Chart and labs reviewed. Noted pt has been advanced to a Dysphagia 3 diet. Inadequate oral intake continues. Ordered Ensure Clinical Strength BID.  Altamese Cabal, RD, LDN

## 2010-09-29 NOTE — Progress Notes (Signed)
Patient's pain has been hard to keep under control this evening.  He has had hydrocodone, 2 tabs, twice and 4 mg of morphine and still complains of pain.

## 2010-09-29 NOTE — Progress Notes (Addendum)
Subjective: Patient status post endoscopy yesterday. Since that time he continues to do well from a GI standpoint no nausea vomiting or hematemesis. His abdominal pain also has continued to improve and he does not report any episodes of diarrhea or loose stools. This morning he did to relate to the nurse subjective chills. He also felt like he had some congestion in his lungs.  Objective: Vital signs in last 24 hours: Temp:  [98.5 F (36.9 C)-100.2 F (37.9 C)] 100.2 F (37.9 C) (07/10 0920) Pulse Rate:  [82-135] 94  (07/09 2100) Resp:  [13-26] 18  (07/09 2100) BP: (112-167)/(67-114) 144/80 mmHg (07/09 2100) SpO2:  [96 %-100 %] 98 % (07/09 2100) Weight change:   General appearance: alert, cooperative, appears stated age and no distress Head: Normocephalic, without obvious abnormality, atraumatic Resp: clear to auscultation bilaterally, no crackles noted. Chest wall: no tenderness Cardio: regular rate and rhythm, S1, S2 normal, no murmur, click, rub or gallop GI: soft, non-tender; bowel sounds normal; no masses,  no organomegaly Extremities: extremities normal, atraumatic, no cyanosis or edema Pulses: 2+ and symmetric Skin: Skin color, texture, turgor normal. No rashes or lesions  Lab Results:  Basename 09/27/10 0531 09/26/10 1704 09/26/10 1640  NA 133* 136 --  K 4.5 4.2 --  CL 105 108 --  CO2 22 -- 21  GLUCOSE 83 77 --  BUN 7 6 --  CREATININE 0.78 0.80 --  CALCIUM 8.3* -- 8.5    Basename 09/29/10 0603 09/28/10 0821 09/27/10 0530  WBC 2.9* -- 3.7*  HGB 10.3*10.3* 10.4* --  HCT 31.2*30.3* 30.8* --  PLT 195 -- 192    Studies/Results: Chest x-ray and urinalysis ordered this morning are pending. Medications:  Scheduled:    . sodium chloride   Intravenous Once  . feeding supplement  237 mL Oral BID BM  . Heparin Lock Flush  500 Units Intravenous Once  . meperidine      . midazolam      . pantoprazole (PROTONIX) IV  40 mg Intravenous Q24H  . sodium chloride      .  sodium chloride      . DISCONTD: feeding supplement  237 mL Oral BID BM    Assessment/Plan: Principal Problem: 1.? New infection-patient with low-grade fever and chills with mild leukopenia secondary to ongoing chemotherapy we'll keep in hospital and monitor/  Urine analysis negative.  CXR does not some bronchitis changes, I question of this is a COPD type bronchitis.  Not much of a fever, but nursing points out he has been on Vicodin which has tylenol in it.  Start Avelox.  If afebrile x 24 hrs, d/c home with 7 days total abx.   *Bleeding gastrointestinal-stable for now, likely secondary to INR. Status post endoscopy with mild inflammation continue PPI.  Active Problems:   Supratherapeutic INR-resolved, will d/c coumadin altogether. (was scheduled to be stopped anyway next month after his PE in December.)   Abdominal  pain, other specified site-stable.  Nausea and vomiting in adult-stable  Anemia, chronic disease   LOS: 3 days   Ashani Pumphrey K 09/29/2010, 11:32 AM

## 2010-09-30 ENCOUNTER — Telehealth: Payer: Self-pay | Admitting: Gastroenterology

## 2010-09-30 ENCOUNTER — Encounter (HOSPITAL_COMMUNITY): Payer: Federal, State, Local not specified - PPO

## 2010-09-30 DIAGNOSIS — K920 Hematemesis: Secondary | ICD-10-CM

## 2010-09-30 LAB — DIFFERENTIAL
Lymphocytes Relative: 33 % (ref 12–46)
Lymphs Abs: 1.5 10*3/uL (ref 0.7–4.0)
Neutro Abs: 1.6 10*3/uL — ABNORMAL LOW (ref 1.7–7.7)
Neutrophils Relative %: 36 % — ABNORMAL LOW (ref 43–77)

## 2010-09-30 LAB — COMPREHENSIVE METABOLIC PANEL
Albumin: 2.3 g/dL — ABNORMAL LOW (ref 3.5–5.2)
BUN: 10 mg/dL (ref 6–23)
CO2: 28 mEq/L (ref 19–32)
Chloride: 97 mEq/L (ref 96–112)
Creatinine, Ser: 0.63 mg/dL (ref 0.50–1.35)
GFR calc non Af Amer: >60 mL/min (ref >60)
Total Bilirubin: 0.1 mg/dL — ABNORMAL LOW (ref 0.3–1.2)

## 2010-09-30 LAB — TYPE AND SCREEN
ABO/RH(D): O POS
Antibody Screen: NEGATIVE
Unit division: 0

## 2010-09-30 LAB — CBC
Platelets: 176 10*3/uL (ref 150–400)
RBC: 3.46 MIL/uL — ABNORMAL LOW (ref 4.22–5.81)
WBC: 4.4 10*3/uL (ref 4.0–10.5)

## 2010-09-30 MED ORDER — SODIUM CHLORIDE 0.9 % IJ SOLN
INTRAMUSCULAR | Status: AC
  Administered 2010-09-30: 10 mL via INTRAVENOUS
  Filled 2010-09-30: qty 10

## 2010-09-30 MED ORDER — SODIUM CHLORIDE 0.9 % IJ SOLN
INTRAMUSCULAR | Status: AC
  Administered 2010-09-30: 10 mL
  Filled 2010-09-30: qty 10

## 2010-09-30 MED ORDER — SODIUM CHLORIDE 0.9 % IJ SOLN
INTRAMUSCULAR | Status: AC
  Administered 2010-09-30: 03:00:00
  Filled 2010-09-30: qty 10

## 2010-09-30 NOTE — Progress Notes (Signed)
Subjective: Pt resting in bed. Desires to go home. Denies fever or chills. No dysphagia. No further hematemesis. No melena or brbpr. Complains of left sided abdominal discomfort, to left of midline incision, described as sharp and gripping at times. Intermittent, not precipitated or aggravated by anything. At its worst 5-6/10. Currently a 3.  EGD July 9: erosive reflux esophagitis, Billroth 1 hemigastrectomy  Objective: Vital signs in last 24 hours: Temp:  [98.2 F (36.8 C)-99.2 F (37.3 C)] 98.2 F (36.8 C) (07/11 0445) Pulse Rate:  [74-99] 74  (07/11 0900) Resp:  [18-20] 18  (07/11 0900) BP: (115-124)/(68-78) 120/78 mmHg (07/11 0445) SpO2:  [96 %-100 %] 97 % (07/11 0900) Weight:  [116 lb 12.8 oz (52.98 kg)] 116 lb 12.8 oz (52.98 kg) (07/11 0445) Last BM Date: 09/29/10 General:   Alert,  Well-developed, well-nourished, pleasant and cooperative in NAD Head:  Normocephalic and atraumatic. Eyes:  Sclera clear, no icterus.   Conjunctiva pink. Mouth:  No deformity or lesions, dentition normal. Heart:  S1, S2 present.  Abdomen:  +BS, non-distended, miltly TTP left-sided abdomen ( to left of incision). No masses noted. No HSM. Midline incision well-healed.  Msk:  Symmetrical without gross deformities. Normal posture. Extremities:  Without clubbing or edema. Neurologic:  Alert and  oriented x4;  grossly normal neurologically. Skin:  Intact without significant lesions or rashes. Psych:  Alert and cooperative. Normal mood and affect.  Intake/Output from previous day: 07/10 0701 - 07/11 0700 In: 602 [P.O.:352; IV Piggyback:250] Out: 475 [Urine:475] Intake/Output this shift: I/O this shift: In: 300 [P.O.:300] Out: 500 [Urine:500]  Lab Results:  Basename 09/29/10 0603 09/28/10 0821 09/27/10 2033  WBC 2.9* -- --  HGB 10.3*10.3* 10.4* 9.9*  HCT 31.2*30.3* 30.8* 29.2*  PLT 195 -- --   Assessment:  60 year old male with hx colon ca, presenting with hematemesis. Erosive reflux  esophagitis on EGD. Pt is stable from a GI standpoint. Left-sided abdominal pain may be r/t scar tissue s/p surgery in May. No concerning findings on exam. Leukopenia: follow-up with oncology.     Plan: Continue PPI indefinitely Outpatient f/u F/U with oncology    LOS: 4 days   [name]  09/30/2010, 2:30 PM   Patient seen at the bedside this afternoon.Discussed with Dr. Sherrie Mustache. Apparently patient is at his 6 month threshold as far as Coumadin therapy is concerned for his prior pulmonary embolus. Unless there is another reason to keep him on Coumadin, I recommend cessation of this medication from his regimen. Of course if he were to remain on Coumadin, would advocate close monitoring of his INR.  He should remain on PPI indefinitely. Would also advise close interval followup with Dr. Ignacia Palma in [place].  I appreciate the opportunity to participate in the care of this nice gentleman.

## 2010-09-30 NOTE — Progress Notes (Signed)
Subjective: No complaints of N/V; diarrhea.  Objective: Vital signs in last 24 hours: Temp:  [98.1 F (36.7 C)-99.2 F (37.3 C)] 98.1 F (36.7 C) (07/11 1400) Pulse Rate:  [74-94] 75  (07/11 1400) Resp:  [18] 18  (07/11 1400) BP: (105-120)/(67-78) 105/67 mmHg (07/11 1400) SpO2:  [96 %-100 %] 99 % (07/11 1400) Weight:  [52.98 kg (116 lb 12.8 oz)] 116 lb 12.8 oz (52.98 kg) (07/11 0445) Weight change:  Last BM Date: 09/29/10  Intake/Output from previous day: 07/10 0701 - 07/11 0700 In: 602 [P.O.:352; IV Piggyback:250] Out: 475 [Urine:475] Intake/Output this shift: I/O this shift: In: 480 [P.O.:480] Out: 500 [Urine:500]  General appearance: alert Resp: diminished breath sounds bibasilar and rales bibasilar Cardio: regular rate and rhythm, S1, S2 normal, no murmur, click, rub or gallop GI: soft, non-tender; bowel sounds normal; no masses,  no organomegaly Extremities: extremities normal, atraumatic, no cyanosis or edema  Lab Results:  Basename 09/29/10 0603 09/28/10 0821  WBC 2.9* --  HGB 10.3*10.3* 10.4*  HCT 31.2*30.3* 30.8*  PLT 195 --   BMET No results found for this basename: NA:2,K:2,CL:2,CO2:2,GLUCOSE:2,BUN:2,CREATININE:2,CALCIUM:2 in the last 72 hours  Studies/Results: No results found.  Medications: I have reviewed the patient's current medications.  Assessment/Plan: 1.UGI bleed sec to erosive esophagitis. On PPI. 2. Blood loss anemia. Stable. 3. Hx of PE, tx w/coumadin for 6 months. PCP planned d/c of coumadin prior to this hospitalization. Would agree, given elevated INR and GI bleed. 4.Leukopenia, likely sec to recent chemo. 5. Colon cancer; undergoing chemo.  Plan: Check labs today and in am; if stable, d/c home tomorrow. Will d/c coumadin altogether now. Consult Dr. Mariel Sleet.  LOS: 4 days   Katherinne Mofield 09/30/2010, 4:01 PM

## 2010-09-30 NOTE — Progress Notes (Signed)
Patient had pain medication all though the night, but only slept for about 30 minutes.  His temperature has been normal.  He expresses his wish to go home today.

## 2010-09-30 NOTE — Telephone Encounter (Signed)
Inpatient at [facility]. Needs outpatient hospital f/u with extender.

## 2010-10-01 ENCOUNTER — Encounter (HOSPITAL_COMMUNITY): Payer: Self-pay | Admitting: Internal Medicine

## 2010-10-01 DIAGNOSIS — Z903 Acquired absence of stomach [part of]: Secondary | ICD-10-CM | POA: Insufficient documentation

## 2010-10-01 DIAGNOSIS — J209 Acute bronchitis, unspecified: Secondary | ICD-10-CM | POA: Diagnosis not present

## 2010-10-01 DIAGNOSIS — Z86711 Personal history of pulmonary embolism: Secondary | ICD-10-CM | POA: Insufficient documentation

## 2010-10-01 DIAGNOSIS — K2210 Ulcer of esophagus without bleeding: Secondary | ICD-10-CM | POA: Diagnosis present

## 2010-10-01 HISTORY — DX: Personal history of pulmonary embolism: Z86.711

## 2010-10-01 LAB — CBC
HCT: 26.3 % — ABNORMAL LOW (ref 39.0–52.0)
Hemoglobin: 9 g/dL — ABNORMAL LOW (ref 13.0–17.0)
RDW: 14.8 % (ref 11.5–15.5)
WBC: 5 10*3/uL (ref 4.0–10.5)

## 2010-10-01 LAB — DIFFERENTIAL
Basophils Absolute: 0 10*3/uL (ref 0.0–0.1)
Lymphocytes Relative: 40 % (ref 12–46)
Monocytes Absolute: 1.5 10*3/uL — ABNORMAL HIGH (ref 0.1–1.0)
Neutro Abs: 1.5 10*3/uL — ABNORMAL LOW (ref 1.7–7.7)

## 2010-10-01 LAB — BASIC METABOLIC PANEL
Chloride: 96 mEq/L (ref 96–112)
GFR calc Af Amer: >60 mL/min (ref >60)
Potassium: 3.8 mEq/L (ref 3.5–5.1)

## 2010-10-01 LAB — PROTIME-INR
INR: 1.29 (ref 0.00–1.49)
Prothrombin Time: 16.3 seconds — ABNORMAL HIGH (ref 11.6–15.2)

## 2010-10-01 MED ORDER — SODIUM CHLORIDE 0.9 % IJ SOLN
INTRAMUSCULAR | Status: AC
  Filled 2010-10-01: qty 3

## 2010-10-01 MED ORDER — MOXIFLOXACIN HCL 400 MG PO TABS: 400.0000 mg | ORAL_TABLET | Freq: Every day | ORAL | Status: AC

## 2010-10-01 MED ORDER — OMEPRAZOLE 20 MG PO CPDR: 40.0000 mg | DELAYED_RELEASE_CAPSULE | Freq: Every day | ORAL | Status: DC

## 2010-10-01 MED ORDER — HEPARIN SOD (PORK) LOCK FLUSH 100 UNIT/ML IV SOLN
500.0000 [IU] | Freq: Once | INTRAVENOUS | Status: AC
  Administered 2010-10-01: 500 [IU] via INTRAVENOUS
  Filled 2010-10-01: qty 5

## 2010-10-01 MED ORDER — ENSURE CLINICAL ST REVIGOR PO LIQD: 237.0000 mL | Freq: Two times a day (BID) | ORAL | Status: DC

## 2010-10-01 MED ORDER — SODIUM CHLORIDE 0.9 % IV SOLN
INTRAVENOUS | Status: AC
  Administered 2010-10-01: 300 mL via INTRAVENOUS

## 2010-10-01 MED ORDER — SODIUM CHLORIDE 0.9 % IJ SOLN
INTRAMUSCULAR | Status: AC
  Administered 2010-10-01: 10 mL via INTRAVENOUS
  Filled 2010-10-01: qty 20

## 2010-10-01 MED ORDER — SODIUM CHLORIDE 0.9 % IJ SOLN
10.0000 mL | INTRAMUSCULAR | Status: DC | PRN
  Administered 2010-09-30 – 2010-10-01 (×2): 10 mL via INTRAVENOUS
  Filled 2010-10-01 (×2): qty 10

## 2010-10-01 NOTE — Progress Notes (Signed)
  Subjective: No complaints of N/V; diarrhea.  Objective: Vital signs in last 24 hours: Temp:  [98.1 F (36.7 C)-98.8 F (37.1 C)] 98.4 F (36.9 C) (07/12 0445) Pulse Rate:  [71-76] 71  (07/12 0831) Resp:  [18] 18  (07/12 0445) BP: (103-113)/(67-72) 103/68 mmHg (07/12 0445) SpO2:  [98 %-99 %] 98 % (07/12 0831) Weight:  [53.207 kg (117 lb 4.8 oz)] 117 lb 4.8 oz (53.207 kg) (07/12 0445) Weight change: 0.227 kg (8 oz) Last BM Date: 09/29/10  Intake/Output from previous day: 07/11 0701 - 07/12 0700 In: 790 [P.O.:530; IV Piggyback:260] Out: 1150 [Urine:1150] Intake/Output this shift: I/O this shift: In: 120 [P.O.:120] Out: 250 [Urine:250]  General appearance: alert Resp: clear. Cardio: regular rate and rhythm, S1, S2 normal, no murmur, click, rub or gallop GI: soft, non-tender; bowel sounds normal; no masses,  no organomegaly Extremities: extremities normal, atraumatic, no cyanosis or edema  Lab Results:  Basename 10/01/10 0538 09/30/10 1649  WBC 5.0 4.4  HGB 9.0* 9.5*  HCT 26.3* 28.5*  PLT 168 176   BMET  Basename 10/01/10 0538 09/30/10 1649  NA 129* 131*  K 3.8 4.4  CL 96 97  CO2 25 28  GLUCOSE 152* 101*  BUN 8 10  CREATININE 0.64 0.63  CALCIUM 8.4 8.1*    Studies/Results: No results found.  Medications: I have reviewed the patient's current medications.  Assessment/Plan: 1.UGI bleed sec to erosive esophagitis. On PPI. 2. Blood loss anemia. Stable. 3. Hx of PE, tx w/coumadin for 6 months. PCP planned d/c of coumadin prior to this hospitalization. Would agree, given elevated INR and GI bleed. 4.Leukopenia, likely sec to recent chemo. 5. Colon cancer; undergoing chemo. 6. Hyponatremia.   Plan: D/c to home today. Follow up w/ Dr. Mariel Sleet as will be scheduled and w/ Dr. Ignacia Palma in 2 weeks. Prior to d/c, will give 300 ml normal saline. Will d/c coumadin altogether now. Consult Dr. Mariel Sleet.  LOS: 5 days   [name] 10/01/2010, 11:27  AM

## 2010-10-01 NOTE — Discharge Summary (Signed)
NAMEADITYA, [name]              ACCOUNT NO.:  [id number]  MEDICAL RECORD NO.:  [id number]  LOCATION:  A335                          FACILITY:  [facility]  PHYSICIAN:  Elliot Cousin, M.D.    DATE OF BIRTH:  [birth year]  DATE OF ADMISSION:  09/26/2010 DATE OF DISCHARGE:  07/12/2012LH                              DISCHARGE SUMMARY   DISCHARGE DIAGNOSES: 1. Upper gastrointestinal bleed secondary to erosive esophagitis in     the setting of coagulopathy secondary to Coumadin. 2. Erosive reflux esophagitis per upper endoscopy by Dr. Jonathon Bellows on September 28, 2010. 3. History of Coumadin therapy secondary to pulmonary embolism     diagnosed in December 2011.  Subsequent coagulopathy secondary to     Coumadin on admission.  The patient's INR was 6.52 on admission. 4. History of pulmonary embolism, treated for 6 months with Coumadin.     Coumadin was discontinued during the hospitalization. 5. Anemia secondary to chronic disease and acute blood loss anemia.     The patient's hemoglobin was 9.0 at the time of discharge.  His     total iron was 126, TIBC 287, reticulocyte count 29.7, ferritin 40,     folate 16.7, and vitamin B12 244. 6. History of vitamin B12 deficiency, on chronic supplementation. 7. Fever, thought to be secondary to acute bronchitis. 8. Hyponatremia, secondary to mild volume depletion. 9. Leukopenia secondary to chemotherapy. 10.Recent diagnosis of colon cancer in May 2012, status post left     hemicolectomy with primary colonic anastomosis by Dr. [name]     on Jul 31, 2010.  The patient is undergoing chemotherapy by Dr.     Mariel Sleet.  SECONDARY DISCHARGE DIAGNOSES: 1. Small bowel obstruction status post exploratory laparotomy in May     2012. 2. Status post left subclavian Port-A-Cath in June 2012. 3. Hypertension. 4. Osteoporosis. 5. Arthritis. 6. TIA.  7. Multiple abdominal surgeries including the hemicolectomy as     mentioned above, status post  abdominal surgeries for small bowel     obstructions x8, status post hernia repair, status post     appendectomy, status post cholecystectomy, status post hemi-     gastrectomy.  DISCHARGE MEDICATIONS: 1. Ensure 237 mL b.i.d. 2. Avelox 400 mg daily for 3 more days. 3. Omeprazole, the dose was increased from 20 mg daily to 40 mg daily     (two 20 mg capsules daily). 4. Vitamin B12 1000 mcg injection as directed every 30 days. 5. Flexeril 10 mg 3 times daily. 6. Folic acid 1 mg daily. 7. Hydrocodone/acetaminophen 5/325 mg 1 tablet every 6 hours as needed     for pain. 8. Lorazepam 1 mg every 4 hours as needed for anxiety and nausea. 9. Megace 40 mg/mL suspension 200 mg b.i.d. 10.Benicar 5 mg daily. 11.Compazine 10 mg every 6 hours as needed for nausea. 12.Thiamine 100 mg daily. 13.Zofran 8 mg 3 times daily as needed for nausea.  DISCHARGE DISPOSITION:  The patient was discharged to home in improved and stable condition on October 01, 2010.  He was advised to follow up with his primary care physician, [name] in [place], [place] in 2  weeks and with Dr. Glenford Peers as scheduled.  CONSULTATIONS: 1. [name], MD 2. Jonathon Bellows, MD FACP [facility] 3. Ladona Horns. Mariel Sleet, MD (nurse practitioner [name]).  PROCEDURE PERFORMED: 1. Chest x-ray on September 29, 2010.  The results revealed emphysematous     and bronchitic changes with mild left basilar atelectasis. 2. EGD on September 28, 2010 by Dr. Jena Gauss.  The results revealed distal     esophageal erosion consistent with erosive reflux esophagitis.     Status post Billroth I type hemigastrectomy, mild erythema of the     gastric mucosal remnant, otherwise normal appearing gastric mucosa,     small hiatal hernia. 3. Acute abdominal series on September 26, 2010.  The results revealed     dilated loops of small bowel representing mechanical obstruction or     ileus, gas throughout the colon, no evidence of intraperitoneal     free air. 4.  Chest x-ray on September 26, 2010.  The results revealed no active     disease.  No significant change.  HISTORY OF PRESENTING ILLNESS:  The patient is a 60 year old man with a past medical history significant for multiple abdominal surgeries, status post recent hemicolectomy with a diagnosis of colon cancer in May 2012, TIA, and hypertension.  He presented to the emergency department on September 26, 2010 with a chief complaint of vomiting blood.  In the emergency department, the patient was borderline hypotensive.  His hemoglobin was 9.4.  His white blood cell count was 3.2.  His serum sodium was 133.  His creatinine was within normal limits at 0.84.  His liver transaminases were within normal limits.  His INR was supratherapeutic at 6.52.  His urinalysis was noninfective.  He was admitted for further evaluation and management.  HOSPITAL COURSE: 1. UPPER GI BLEED SECONDARY TO EROSIVE ESOPHAGITIS, COAGULOPATHY     SECONDARY TO COUMADIN, ANEMIA SECONDARY TO ACUTE BLOOD LOSS AND     CHRONIC DISEASE.  The patient was started on IV fluid volume     repletion with normal saline.  Vitamin K was given in the emergency     department.  This was followed by 1 unit of fresh frozen plasma.     His hemoglobin and hematocrit were followed closely.  His INR did     eventually normalize.  His hemoglobin fell slightly to 9.0 prior to     discharge.  There was no indication for packed blood cell     transfusions.  Anemia studies were ordered.  The results were     dictated above.  In addition to IV fluids, he was started on     intravenous Protonix empirically.  Gastroenterologist Dr. Suszanne Conners     [name] was consulted.  Following his assessment, he proceeded with     an upper endoscopy the following day.  The results of the EGD were     dictated above.  Per his recommendation, the patient was maintained     on proton pump inhibitor therapy.  He had been taking omeprazole at     20 mg daily in the outpatient  setting.  Upon discharge, the dose     was increased to 40 mg daily.  The decision was made to discontinue     Coumadin.  He had been treated for his previously diagnosed     pulmonary embolism for 6 months.  According to the patient, his     primary care physician was planning on stopping the Coumadin  anyway.  In the setting of his coagulopathy and upper GI bleed,     Coumadin was discontinued during the hospital course.  The patient     had no evidence of nausea, vomiting, or hematemesis during the     hospitalization.  His diet was eventually advanced which he     tolerated well.  He was completely asymptomatic at the time of     hospital discharge. 2. HISTORY OF COLON CANCER, QUESTIONABLE MILD ILEUS.  The patient did     complain of abdominal pain.  The acute abdominal series was     suggestive of an ileus or partial obstruction.  General surgeon Dr.     Lovell Sheehan and then subsequently Dr. Leticia Penna were consulted.  Per     their assessment, the patient had no evidence of a small bowel     obstruction.  The abdominal pain was thought to be secondary to     esophagitis and/or incisional tenderness from the previous     hemicolectomy in May 2012.  As stated before, his diet was advanced     which he tolerated well.  His abdominal pain subsided. 3. HYPONATREMIA.  The patient was hydrated during the first 2 days of     the hospitalization.  His serum sodium was slightly low at 133.  It     fell to 131 when the IV fluids were tapered down.  Prior to     discharge, it fell slightly again to 129.  Because of the decrease,     he was given 300 mL of normal saline prior to discharge.  He was     advised to follow a liberal diet without sodium restriction. 4. ACUTE BRONCHITIS.  The patient developed a low-grade fever several     days following the hospital admission.  A chest x-ray was ordered.     It revealed findings suggestive of bronchitis.  He was started on     Avelox.  Following the  initiation of Avelox, his fever subsided.     He received 3 days of treatment with Avelox.  He was discharged on     3 more days of therapy. 5. LEUKOPENIA.  The patient's white blood cell count fell to 2.9.     This was felt to be secondary to chemotherapy.  It did improve to     5.0 prior to discharge.     Elliot Cousin, M.D.     DF/MEDQ  D:  10/01/2010  T:  10/01/2010  Job:  [id number]

## 2010-10-01 NOTE — Discharge Summary (Signed)
  Discharge summary dictation number [id number].

## 2010-10-01 NOTE — Progress Notes (Signed)
Patient did not have an order for flushing his saline locked port.  A nursing order was entered to correct this so that flushes may be pulled and used to flush.  The patient did well overnight.  Does not sleep.  Complained of pain regularly when pain meds were due.

## 2010-10-01 NOTE — Progress Notes (Signed)
Patient d/c home No c/o pain at d/c  Left floor via wheelchair Verbalized understanding of d/c instructions and follow up appts

## 2010-10-01 NOTE — Progress Notes (Signed)
Spoke with patient before he was discharged.  He shared details about medical history/support of family and faith issues.  He shared details of his cancer diagnosis.  Discussed details of how he is coping.  Prayed with him.   I also spoke briefly about the Advance Directive materials I had given him.  If he would like to complete them we will discuss when he met with Dr Mariel Sleet.

## 2010-10-02 ENCOUNTER — Other Ambulatory Visit (HOSPITAL_COMMUNITY): Payer: Self-pay | Admitting: Oncology

## 2010-10-02 NOTE — Progress Notes (Signed)
He was inpt at this time will have to check w/Tom, i am not sure.

## 2010-10-07 NOTE — Discharge Summary (Signed)
  [name], [name]              ACCOUNT NO.:  [id number]  MEDICAL RECORD NO.:  [id number]  LOCATION:  A320                          FACILITY:  [facility]  PHYSICIAN:  [name], MD      DATE OF BIRTH:  05-Jun-1950  DATE OF ADMISSION:  07/02/2010 DATE OF DISCHARGE:  04/21/2012LH                              DISCHARGE SUMMARY   ADMISSION DIAGNOSIS:  Nausea and vomiting consistent with small bowel obstruction.  DISCHARGE DIAGNOSES: 1. Resolution of small bowel obstruction. 2. History of recent pulmonary embolization and deep vein thrombosis. 3. Hypertension. 4. History of alcohol abuse.  PROCEDURES:  None.  DISPOSITION:  Home.  BRIEF HISTORY AND PHYSICAL:  Please see the admission history and physical for the complete H and P.  The patient is a 60 year old male who presented to [facility] with nausea, vomiting, and abdominal pain.  This is consistent with a small bowel obstruction.  He was admitted for planned intervention and management.  HOSPITAL COURSE:  The patient was admitted on July 02, 2010.  He was admitted for continued conservative management.  He is continued on n.p.o. status and IV fluid hydration and nasogastric tube decompression. He did have slow progression with eventual passage of flatus.  This did slowly improve.  There was some bowel function.  I then slowly started back on some clear liquids.  Initially, he did have some nausea which did go away with progression of his diet, but eventually he was advanced to regular diet, was tolerating this well without any abdominal pain or discomfort, without any nausea or vomiting and was having normal bowel function.  On July 11, 2010, the patient was discharged home.  He was instructed to increase activity as tolerated.  He was instructed to continue a low-residual diet.  He is to return to see me in the office in the next couple of weeks.  He is to call should he have any questions or  concerns.  DISCHARGE MEDICATIONS:  Please see the discharge medication reconciliation sheet for all discharge medications.     [name], MD     BZ/MEDQ  D:  09/15/2010  T:  09/16/2010  Job:  [id number]  Electronically Signed by [name] MD on 10/07/2010 08:26:26 AM

## 2010-10-08 ENCOUNTER — Ambulatory Visit: Payer: Federal, State, Local not specified - PPO | Admitting: Urgent Care

## 2010-10-08 ENCOUNTER — Encounter (HOSPITAL_COMMUNITY): Payer: Self-pay | Admitting: Internal Medicine

## 2010-10-12 ENCOUNTER — Encounter (HOSPITAL_COMMUNITY): Payer: Federal, State, Local not specified - PPO | Attending: Oncology

## 2010-10-12 VITALS — BP 138/86 | HR 86 | Temp 98.9°F | Ht 69.0 in | Wt 131.4 lb

## 2010-10-12 DIAGNOSIS — C189 Malignant neoplasm of colon, unspecified: Secondary | ICD-10-CM | POA: Insufficient documentation

## 2010-10-12 DIAGNOSIS — Z86711 Personal history of pulmonary embolism: Secondary | ICD-10-CM | POA: Insufficient documentation

## 2010-10-12 DIAGNOSIS — Z5111 Encounter for antineoplastic chemotherapy: Secondary | ICD-10-CM

## 2010-10-12 LAB — CBC
HCT: 27.8 % — ABNORMAL LOW (ref 39.0–52.0)
Hemoglobin: 9.4 g/dL — ABNORMAL LOW (ref 13.0–17.0)
MCHC: 33.8 g/dL (ref 30.0–36.0)
RBC: 3.39 MIL/uL — ABNORMAL LOW (ref 4.22–5.81)

## 2010-10-12 LAB — DIFFERENTIAL
Basophils Relative: 0 % (ref 0–1)
Lymphocytes Relative: 18 % (ref 12–46)
Lymphs Abs: 2.2 10*3/uL (ref 0.7–4.0)
Monocytes Absolute: 1 10*3/uL (ref 0.1–1.0)
Monocytes Relative: 8 % (ref 3–12)
Neutro Abs: 8.4 10*3/uL — ABNORMAL HIGH (ref 1.7–7.7)

## 2010-10-12 MED ORDER — OXALIPLATIN CHEMO INJECTION 100 MG/20ML
85.0000 mg/m2 | Freq: Once | INTRAVENOUS | Status: AC
  Administered 2010-10-12: 145 mg via INTRAVENOUS
  Filled 2010-10-12: qty 29

## 2010-10-12 MED ORDER — ONDANSETRON 8 MG/50ML IVPB (CHCC): 24.0000 mg | Freq: Once | INTRAVENOUS | Status: DC

## 2010-10-12 MED ORDER — LEUCOVORIN CALCIUM INJECTION 200 MG
33.0000 mg | Freq: Once | INTRAMUSCULAR | Status: AC
  Administered 2010-10-12: 34 mg via INTRAVENOUS
  Filled 2010-10-12: qty 1.7

## 2010-10-12 MED ORDER — LORAZEPAM 2 MG/ML IJ SOLN
0.5000 mg | Freq: Once | INTRAMUSCULAR | Status: AC
  Administered 2010-10-12: 0.5 mg via INTRAVENOUS

## 2010-10-12 MED ORDER — SODIUM CHLORIDE 0.9 % IV SOLN
INTRAVENOUS | Status: DC
  Administered 2010-10-12: 11:00:00 via INTRAVENOUS

## 2010-10-12 MED ORDER — SODIUM CHLORIDE 0.9 % IJ SOLN: 10.0000 mL | INTRAMUSCULAR | Status: DC | PRN

## 2010-10-12 MED ORDER — HEPARIN SOD (PORK) LOCK FLUSH 100 UNIT/ML IV SOLN: 500.0000 [IU] | Freq: Once | INTRAVENOUS | Status: DC | PRN

## 2010-10-12 MED ORDER — SODIUM CHLORIDE 0.9 % IV SOLN
3960.0000 mg | INTRAVENOUS | Status: DC
  Administered 2010-10-12: 3960 mg via INTRAVENOUS
  Filled 2010-10-12: qty 79

## 2010-10-12 MED ORDER — HYDROCODONE-ACETAMINOPHEN 5-325 MG PO TABS: 1.0000 | ORAL_TABLET | Freq: Four times a day (QID) | ORAL | Status: DC | PRN

## 2010-10-12 MED ORDER — SODIUM CHLORIDE 0.9 % IV SOLN
Freq: Once | INTRAVENOUS | Status: AC
  Administered 2010-10-12: 24 mg via INTRAVENOUS
  Filled 2010-10-12: qty 12

## 2010-10-12 MED ORDER — FLUOROURACIL CHEMO INJECTION 2.5 GM/50ML
660.0000 mg | Freq: Once | INTRAVENOUS | Status: AC
  Administered 2010-10-12: 650 mg via INTRAVENOUS
  Filled 2010-10-12: qty 13

## 2010-10-12 MED ORDER — DEXAMETHASONE SODIUM PHOSPHATE 10 MG/ML IJ SOLN: 12.0000 mg | Freq: Once | INTRAMUSCULAR | Status: DC

## 2010-10-12 MED ORDER — LORAZEPAM 2 MG/ML IJ SOLN
INTRAMUSCULAR | Status: AC
  Administered 2010-10-12: 0.5 mg via INTRAVENOUS
  Filled 2010-10-12: qty 1

## 2010-10-12 MED ORDER — DEXTROSE 5 % IV SOLN
INTRAVENOUS | Status: DC
  Administered 2010-10-12: 50 mL via INTRAVENOUS
  Filled 2010-10-12: qty 1000

## 2010-10-12 NOTE — Progress Notes (Deleted)
[facility] Discharge Instructions for Patients Receiving Chemotherapy  Today you received the following chemotherapy agents ***  To help prevent nausea and vomiting after your treatment, we encourage you to take your nausea medication {CHL ONC AP TAKE HOME ZOXW:960454098} Begin taking it at *** and take it as often as prescribed for the next {CHL ONC MEDICATION HOURS:115400110} hours.   If you develop nausea and vomiting that is not controlled by your nausea medication, call the clinic. If it is after clinic hours your family physician or the after hours number for the clinic or go to the Emergency Department.   BELOW ARE SYMPTOMS THAT SHOULD BE REPORTED IMMEDIATELY:  *FEVER GREATER THAN 101.0 F  *CHILLS WITH OR WITHOUT FEVER  NAUSEA AND VOMITING THAT IS NOT CONTROLLED WITH YOUR NAUSEA MEDICATION  *UNUSUAL SHORTNESS OF BREATH  *UNUSUAL BRUISING OR BLEEDING  TENDERNESS IN MOUTH AND THROAT WITH OR WITHOUT PRESENCE OF ULCERS  *URINARY PROBLEMS  *BOWEL PROBLEMS  UNUSUAL RASH Items with * indicate a potential emergency and should be followed up as soon as possible.  One of the nurses will contact you 24 hours after your treatment. Please let the nurse know about any problems that you may have experienced. Feel free to call the clinic you have any questions or concerns. The clinic phone number is [phone].   I have been informed and understand all the instructions given to me. I know to contact the clinic, my physician, or go to the Emergency Department if any problems should occur. I do not have any questions at this time, but understand that I may call the clinic during office hours or the Patient Navigator at [phone] should I have any questions or need assistance in obtaining follow up care.    __________________________________________  _____________  __________ Signature of Patient or Authorized Representative            Date                    Time    __________________________________________ Nurse's Signature

## 2010-10-12 NOTE — Progress Notes (Signed)
Tolerated chemotherapy infusion of oxaliplatin, leucovorin and 5FU Bolus without complaints.  CI pump with 5FU initiated and infusing well.  D/C home with family.  Knows to call 800 telephone number on pump if problems occur with infusion.

## 2010-10-14 ENCOUNTER — Encounter (HOSPITAL_BASED_OUTPATIENT_CLINIC_OR_DEPARTMENT_OTHER): Payer: Federal, State, Local not specified - PPO

## 2010-10-14 DIAGNOSIS — Z452 Encounter for adjustment and management of vascular access device: Secondary | ICD-10-CM

## 2010-10-14 DIAGNOSIS — C189 Malignant neoplasm of colon, unspecified: Secondary | ICD-10-CM

## 2010-10-14 MED ORDER — HEPARIN SOD (PORK) LOCK FLUSH 100 UNIT/ML IV SOLN
INTRAVENOUS | Status: AC
  Filled 2010-10-14: qty 5

## 2010-10-14 NOTE — Progress Notes (Signed)
[name] presented for Portacath access and flush and d/c of continous infusion pump..  Portacath located lt chest wall with access in place  H 20 needle. Good blood return present. Portacath flushed with 20ml NS and 500U/48ml Heparin and needle removed intact. Procedure without incident. Patient tolerated procedure and tolerated chemotherapy well.

## 2010-10-15 ENCOUNTER — Encounter: Payer: Self-pay | Admitting: Gastroenterology

## 2010-10-15 NOTE — Telephone Encounter (Signed)
Pt is aware of OV for 8/16 @ 10 with AS

## 2010-10-16 ENCOUNTER — Encounter (HOSPITAL_BASED_OUTPATIENT_CLINIC_OR_DEPARTMENT_OTHER): Payer: Federal, State, Local not specified - PPO | Admitting: Oncology

## 2010-10-16 ENCOUNTER — Encounter (HOSPITAL_COMMUNITY): Payer: Self-pay | Admitting: Oncology

## 2010-10-16 VITALS — BP 119/80 | HR 91 | Temp 98.7°F | Wt 125.4 lb

## 2010-10-16 DIAGNOSIS — Z86711 Personal history of pulmonary embolism: Secondary | ICD-10-CM

## 2010-10-16 DIAGNOSIS — E538 Deficiency of other specified B group vitamins: Secondary | ICD-10-CM

## 2010-10-16 DIAGNOSIS — C189 Malignant neoplasm of colon, unspecified: Secondary | ICD-10-CM

## 2010-10-16 NOTE — Progress Notes (Signed)
This office note has been dictated.

## 2010-10-16 NOTE — Progress Notes (Signed)
CC:   [name] Pillar, MD [name]  DIAGNOSES: 1. Stage IIIB adenocarcinoma of the colon, here for followup.  He has     had 2 treatments of a planned 12 of FOLFOX.  He has tolerated the     first 2 fairly well. 2. He was recently in the hospital for nausea and vomiting but I     suspect it was more postoperative than it was related to the     chemotherapy. 3. He has a history of gastric surgery on multiple occasions with the     discovery 4-5 years ago of B12 deficiency due to poor absorption. 4. He also has history of pulmonary embolus diagnosed at [facility] in December 2011, treated was 6 months of Coumadin it     sounds like. 5. History of alcoholism and he has been abstinent since January 2012. 6. History of hypertension. 7. 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  [name] is here today.  His appetite is perking up with the Megace 200 mg twice a day and he is on the suspension.  He does have some nausea for which he uses the Zofran and sometimes other medications.  He remains mildly anemic around 9-9.5, but we will not transfusion him at this juncture.  Other than that he is stable for a review of systems standpoint.  His skin color on exam is normal except for nail color which is pale. He is alert.  He is oriented.  His vitals do show that his weight is 125 pounds down from his usual 140 pounds he states.  He is afebrile.  Blood pressure 119/80.  He has respirations 16 and unlabored.  Pulse right around 88 and regular.  He has skin that is warm and dry to the touch. His midline incision is healed well but he has hyperactive bowel sounds but he states only has one bowel movement a day.  His lungs are clear but with mildly diminished breath sounds and hyperresonance to percussion.  Heart shows no murmur, rub or gallop.  Port is intact in left upper chest wall.  He has no hepatosplenomegaly.  He has no peripheral edema.  So he is I think tolerating  therapy well.  I hope that he does not get what appears to have been a small bowel obstruction recently.  We will see him back either way in about 4-6 weeks for followup visit but he has his next appointment for cycle 3 of a planned 12 on 10/26/2010.    ______________________________ Ladona Horns. Mariel Sleet, MD ESN/MEDQ  D:  10/16/2010  T:  10/16/2010  Job:  [id number]

## 2010-10-16 NOTE — Patient Instructions (Signed)
[facility]  Discharge Instructions     SPECIAL INSTRUCTIONS/FOLLOW-UP: Please see the front desk for your appointments.   I acknowledge that I have been informed and understand all the instructions given to me and received a copy. I do not have any more questions at this time, but understand that I may call the Specialty Clinic at [facility] at [phone] during business hours should I have any further questions or need assistance in obtaining follow-up care.    __________________________________________  _____________  __________ Signature of Patient or Authorized Representative            Date                   Time    __________________________________________ Nurse's Signature

## 2010-10-26 ENCOUNTER — Encounter (HOSPITAL_COMMUNITY): Payer: Federal, State, Local not specified - PPO | Attending: Oncology

## 2010-10-26 ENCOUNTER — Encounter (HOSPITAL_BASED_OUTPATIENT_CLINIC_OR_DEPARTMENT_OTHER): Payer: Federal, State, Local not specified - PPO

## 2010-10-26 VITALS — BP 134/93 | HR 85 | Temp 98.1°F | Wt 128.6 lb

## 2010-10-26 DIAGNOSIS — Z86711 Personal history of pulmonary embolism: Secondary | ICD-10-CM

## 2010-10-26 DIAGNOSIS — C189 Malignant neoplasm of colon, unspecified: Secondary | ICD-10-CM

## 2010-10-26 DIAGNOSIS — Z5111 Encounter for antineoplastic chemotherapy: Secondary | ICD-10-CM

## 2010-10-26 LAB — CBC
HCT: 29.4 % — ABNORMAL LOW (ref 39.0–52.0)
Hemoglobin: 9.8 g/dL — ABNORMAL LOW (ref 13.0–17.0)
MCH: 27.1 pg (ref 26.0–34.0)
MCHC: 33.3 g/dL (ref 30.0–36.0)
MCV: 81.4 fL (ref 78.0–100.0)

## 2010-10-26 LAB — COMPREHENSIVE METABOLIC PANEL
AST: 13 U/L (ref 0–37)
Albumin: 3.1 g/dL — ABNORMAL LOW (ref 3.5–5.2)
Calcium: 8.6 mg/dL (ref 8.4–10.5)
Creatinine, Ser: 0.64 mg/dL (ref 0.50–1.35)
GFR calc non Af Amer: >60 mL/min (ref >60)

## 2010-10-26 LAB — DIFFERENTIAL
Basophils Absolute: 0 10*3/uL (ref 0.0–0.1)
Lymphocytes Relative: 39 % (ref 12–46)
Monocytes Absolute: 0.9 10*3/uL (ref 0.1–1.0)
Monocytes Relative: 14 % — ABNORMAL HIGH (ref 3–12)
Neutro Abs: 3 10*3/uL (ref 1.7–7.7)
Neutrophils Relative %: 46 % (ref 43–77)

## 2010-10-26 MED ORDER — DEXAMETHASONE SODIUM PHOSPHATE 10 MG/ML IJ SOLN: 12.0000 mg | Freq: Once | INTRAMUSCULAR | Status: DC

## 2010-10-26 MED ORDER — FLUOROURACIL CHEMO INJECTION 2.5 GM/50ML
660.0000 mg | Freq: Once | INTRAVENOUS | Status: AC
  Administered 2010-10-26: 650 mg via INTRAVENOUS
  Filled 2010-10-26: qty 13

## 2010-10-26 MED ORDER — ONDANSETRON 8 MG/50ML IVPB (CHCC): 24.0000 mg | Freq: Once | INTRAVENOUS | Status: DC

## 2010-10-26 MED ORDER — DEXTROSE 5 % IV SOLN
INTRAVENOUS | Status: DC
  Administered 2010-10-26: 10:00:00 via INTRAVENOUS
  Filled 2010-10-26 (×2): qty 1000

## 2010-10-26 MED ORDER — LORAZEPAM 2 MG/ML IJ SOLN
INTRAMUSCULAR | Status: AC
  Filled 2010-10-26: qty 1

## 2010-10-26 MED ORDER — SODIUM CHLORIDE 0.9 % IV SOLN
Freq: Once | INTRAVENOUS | Status: AC
  Administered 2010-10-26: 24 mg via INTRAVENOUS
  Filled 2010-10-26: qty 12

## 2010-10-26 MED ORDER — SODIUM CHLORIDE 0.9 % IJ SOLN
10.0000 mL | INTRAMUSCULAR | Status: DC | PRN
  Administered 2010-10-26: 10 mL

## 2010-10-26 MED ORDER — HYDROCODONE-ACETAMINOPHEN 5-325 MG PO TABS: 1.0000 | ORAL_TABLET | Freq: Four times a day (QID) | ORAL | Status: AC | PRN

## 2010-10-26 MED ORDER — ONDANSETRON HCL 8 MG PO TABS: 8.0000 mg | ORAL_TABLET | Freq: Three times a day (TID) | ORAL | Status: AC | PRN

## 2010-10-26 MED ORDER — SODIUM CHLORIDE 0.9 % IJ SOLN
INTRAMUSCULAR | Status: AC
  Filled 2010-10-26: qty 10

## 2010-10-26 MED ORDER — LORAZEPAM 2 MG/ML IJ SOLN
0.5000 mg | Freq: Once | INTRAMUSCULAR | Status: AC
  Administered 2010-10-26: 0.5 mg via INTRAVENOUS

## 2010-10-26 MED ORDER — LEUCOVORIN CALCIUM INJECTION 100 MG
34.0000 mg | Freq: Once | INTRAMUSCULAR | Status: AC
  Administered 2010-10-26: 34 mg via INTRAVENOUS
  Filled 2010-10-26: qty 1.7

## 2010-10-26 MED ORDER — OXALIPLATIN CHEMO INJECTION 100 MG/20ML
85.0000 mg/m2 | Freq: Once | INTRAVENOUS | Status: AC
  Administered 2010-10-26: 145 mg via INTRAVENOUS
  Filled 2010-10-26: qty 29

## 2010-10-26 MED ORDER — DEXTROSE 5 % IV SOLN
INTRAVENOUS | Status: DC
  Filled 2010-10-26: qty 100

## 2010-10-26 MED ORDER — SODIUM CHLORIDE 0.9 % IV SOLN
INTRAVENOUS | Status: DC
  Administered 2010-10-26: 10:00:00 via INTRAVENOUS

## 2010-10-26 MED ORDER — LEUCOVORIN CALCIUM INJECTION 350 MG
33.0000 mg | Freq: Once | INTRAVENOUS | Status: DC
  Filled 2010-10-26: qty 1.7

## 2010-10-26 MED ORDER — SODIUM CHLORIDE 0.9 % IV SOLN
3960.0000 mg | INTRAVENOUS | Status: DC
  Administered 2010-10-26: 3960 mg via INTRAVENOUS
  Filled 2010-10-26: qty 79

## 2010-10-26 NOTE — Progress Notes (Signed)
Labs drawn today for cbc/diff,cmp 

## 2010-10-26 NOTE — Progress Notes (Signed)
Addended by: Ellouise Newer III on: 10/26/2010 01:11 PM   Modules accepted: Orders, Medications

## 2010-10-27 ENCOUNTER — Telehealth (HOSPITAL_COMMUNITY): Payer: Self-pay

## 2010-10-27 NOTE — Telephone Encounter (Signed)
Tolerated well

## 2010-10-28 ENCOUNTER — Encounter (HOSPITAL_BASED_OUTPATIENT_CLINIC_OR_DEPARTMENT_OTHER): Payer: Federal, State, Local not specified - PPO

## 2010-10-28 DIAGNOSIS — Z5111 Encounter for antineoplastic chemotherapy: Secondary | ICD-10-CM

## 2010-10-28 DIAGNOSIS — C189 Malignant neoplasm of colon, unspecified: Secondary | ICD-10-CM

## 2010-10-28 MED ORDER — HEPARIN SOD (PORK) LOCK FLUSH 100 UNIT/ML IV SOLN
500.0000 [IU] | Freq: Once | INTRAVENOUS | Status: AC
  Administered 2010-10-28: 500 [IU] via INTRAVENOUS

## 2010-10-28 MED ORDER — HEPARIN SOD (PORK) LOCK FLUSH 100 UNIT/ML IV SOLN
INTRAVENOUS | Status: AC
  Filled 2010-10-28: qty 5

## 2010-11-05 ENCOUNTER — Ambulatory Visit: Payer: Federal, State, Local not specified - PPO | Admitting: Gastroenterology

## 2010-11-09 ENCOUNTER — Other Ambulatory Visit (HOSPITAL_COMMUNITY): Payer: Self-pay | Admitting: Oncology

## 2010-11-09 ENCOUNTER — Telehealth (HOSPITAL_COMMUNITY): Payer: Self-pay | Admitting: *Deleted

## 2010-11-09 ENCOUNTER — Encounter (HOSPITAL_COMMUNITY): Payer: Federal, State, Local not specified - PPO

## 2010-11-09 ENCOUNTER — Encounter (HOSPITAL_BASED_OUTPATIENT_CLINIC_OR_DEPARTMENT_OTHER): Payer: Federal, State, Local not specified - PPO

## 2010-11-09 VITALS — BP 104/72 | HR 102 | Temp 98.1°F | Wt 123.0 lb

## 2010-11-09 DIAGNOSIS — C189 Malignant neoplasm of colon, unspecified: Secondary | ICD-10-CM

## 2010-11-09 DIAGNOSIS — Z5111 Encounter for antineoplastic chemotherapy: Secondary | ICD-10-CM

## 2010-11-09 LAB — COMPREHENSIVE METABOLIC PANEL
Alkaline Phosphatase: 74 U/L (ref 39–117)
BUN: 9 mg/dL (ref 6–23)
CO2: 17 mEq/L — ABNORMAL LOW (ref 19–32)
Chloride: 105 mEq/L (ref 96–112)
Creatinine, Ser: 0.74 mg/dL (ref 0.50–1.35)
GFR calc Af Amer: >60 mL/min (ref >60)
GFR calc non Af Amer: >60 mL/min (ref >60)
Glucose, Bld: 78 mg/dL (ref 70–99)
Total Bilirubin: 0.2 mg/dL — ABNORMAL LOW (ref 0.3–1.2)

## 2010-11-09 LAB — CBC
HCT: 30 % — ABNORMAL LOW (ref 39.0–52.0)
Hemoglobin: 10.2 g/dL — ABNORMAL LOW (ref 13.0–17.0)
MCH: 27.4 pg (ref 26.0–34.0)
MCV: 80.6 fL (ref 78.0–100.0)
Platelets: 130 10*3/uL — ABNORMAL LOW (ref 150–400)
RBC: 3.72 MIL/uL — ABNORMAL LOW (ref 4.22–5.81)

## 2010-11-09 LAB — DIFFERENTIAL
Basophils Absolute: 0 10*3/uL (ref 0.0–0.1)
Basophils Relative: 1 % (ref 0–1)
Eosinophils Absolute: 0 10*3/uL (ref 0.0–0.7)
Neutro Abs: 1.5 10*3/uL — ABNORMAL LOW (ref 1.7–7.7)
Neutrophils Relative %: 38 % — ABNORMAL LOW (ref 43–77)

## 2010-11-09 MED ORDER — DEXTROSE 5 % IV SOLN
INTRAVENOUS | Status: AC
  Administered 2010-11-09: 50 mL via INTRAVENOUS
  Filled 2010-11-09 (×2): qty 1000

## 2010-11-09 MED ORDER — LEUCOVORIN CALCIUM INJECTION 200 MG
33.0000 mg | Freq: Once | INTRAMUSCULAR | Status: AC
  Administered 2010-11-09: 34 mg via INTRAVENOUS
  Filled 2010-11-09: qty 1.7

## 2010-11-09 MED ORDER — HEPARIN SOD (PORK) LOCK FLUSH 100 UNIT/ML IV SOLN: 500.0000 [IU] | Freq: Once | INTRAVENOUS | Status: DC | PRN

## 2010-11-09 MED ORDER — LORAZEPAM 2 MG/ML IJ SOLN
0.5000 mg | Freq: Once | INTRAMUSCULAR | Status: AC
  Administered 2010-11-09: 0.5 mg via INTRAVENOUS

## 2010-11-09 MED ORDER — DEXTROSE 5 % IV SOLN
INTRAVENOUS | Status: DC
  Filled 2010-11-09 (×2): qty 1000

## 2010-11-09 MED ORDER — SODIUM CHLORIDE 0.9 % IV SOLN
INTRAVENOUS | Status: DC
  Administered 2010-11-09: 500 mL via INTRAVENOUS

## 2010-11-09 MED ORDER — FLUOROURACIL CHEMO INJECTION 2.5 GM/50ML
660.0000 mg | Freq: Once | INTRAVENOUS | Status: AC
  Administered 2010-11-09: 650 mg via INTRAVENOUS
  Filled 2010-11-09: qty 13

## 2010-11-09 MED ORDER — LORAZEPAM 2 MG/ML IJ SOLN
INTRAMUSCULAR | Status: AC
  Administered 2010-11-09: 0.5 mg via INTRAVENOUS
  Filled 2010-11-09: qty 1

## 2010-11-09 MED ORDER — OXALIPLATIN CHEMO INJECTION 100 MG/20ML
85.0000 mg/m2 | Freq: Once | INTRAVENOUS | Status: AC
  Administered 2010-11-09: 145 mg via INTRAVENOUS
  Filled 2010-11-09: qty 29

## 2010-11-09 MED ORDER — SODIUM CHLORIDE 0.9 % IJ SOLN
10.0000 mL | INTRAMUSCULAR | Status: DC | PRN
  Administered 2010-11-09: 10 mL

## 2010-11-09 MED ORDER — DEXAMETHASONE SODIUM PHOSPHATE 10 MG/ML IJ SOLN: 12.0000 mg | Freq: Once | INTRAMUSCULAR | Status: DC

## 2010-11-09 MED ORDER — SODIUM CHLORIDE 0.9 % IV SOLN
3960.0000 mg | INTRAVENOUS | Status: DC
  Administered 2010-11-09: 3960 mg via INTRAVENOUS
  Filled 2010-11-09: qty 79

## 2010-11-09 MED ORDER — SODIUM CHLORIDE 0.9 % IJ SOLN
INTRAMUSCULAR | Status: AC
  Filled 2010-11-09: qty 10

## 2010-11-09 MED ORDER — ONDANSETRON 8 MG/50ML IVPB (CHCC): 24.0000 mg | Freq: Once | INTRAVENOUS | Status: DC

## 2010-11-09 MED ORDER — SODIUM CHLORIDE 0.9 % IV SOLN
Freq: Once | INTRAVENOUS | Status: AC
  Administered 2010-11-09: 24 mg via INTRAVENOUS
  Filled 2010-11-09: qty 12

## 2010-11-09 NOTE — Progress Notes (Signed)
Labs drawn today for cbc/diff,cmp,cea 

## 2010-11-09 NOTE — Telephone Encounter (Signed)
Patient requests refills on megace, norco 5-325 and ativan 1mg .   Uses [facility] [place] [phone]

## 2010-11-10 NOTE — Telephone Encounter (Signed)
ok 

## 2010-11-11 ENCOUNTER — Encounter (HOSPITAL_BASED_OUTPATIENT_CLINIC_OR_DEPARTMENT_OTHER): Payer: Federal, State, Local not specified - PPO

## 2010-11-11 ENCOUNTER — Telehealth (HOSPITAL_COMMUNITY): Payer: Self-pay | Admitting: *Deleted

## 2010-11-11 VITALS — BP 148/94 | HR 97 | Temp 98.2°F

## 2010-11-11 DIAGNOSIS — C189 Malignant neoplasm of colon, unspecified: Secondary | ICD-10-CM

## 2010-11-11 MED ORDER — HEPARIN SOD (PORK) LOCK FLUSH 100 UNIT/ML IV SOLN
INTRAVENOUS | Status: AC
  Administered 2010-11-11: 500 [IU] via INTRAVENOUS
  Filled 2010-11-11: qty 5

## 2010-11-11 MED ORDER — SODIUM CHLORIDE 0.9 % IJ SOLN
INTRAMUSCULAR | Status: AC
  Administered 2010-11-11: 10 mL via INTRAVENOUS
  Filled 2010-11-11: qty 10

## 2010-11-11 MED ORDER — HEPARIN SOD (PORK) LOCK FLUSH 100 UNIT/ML IV SOLN
500.0000 [IU] | Freq: Once | INTRAVENOUS | Status: AC | PRN
  Administered 2010-11-11: 500 [IU] via INTRAVENOUS

## 2010-11-11 MED ORDER — SODIUM CHLORIDE 0.9 % IJ SOLN
10.0000 mL | INTRAMUSCULAR | Status: DC | PRN
  Administered 2010-11-11: 10 mL via INTRAVENOUS

## 2010-11-11 NOTE — Progress Notes (Signed)
Pt in for removal of infusion pump and infusion complete. C/o nausea and vomitting x 2 today. Noticed dark blood in vomittus. Pain in belly rated a 7 today. States this is ongoing but worse today.Talked to Dr Mariel Sleet and will refer pt. Back to see Dr. Jena Gauss.

## 2010-11-11 NOTE — Telephone Encounter (Signed)
Pt in for removal of infusion pump and infusion complete. C/o nausea and vomitting x 2 today. Noticed dark blood in vomittus. Pain in belly rated a 7 today. States this is ongoing but worse today.

## 2010-11-15 ENCOUNTER — Other Ambulatory Visit: Payer: Self-pay

## 2010-11-15 ENCOUNTER — Emergency Department (HOSPITAL_COMMUNITY): Payer: Federal, State, Local not specified - PPO

## 2010-11-15 ENCOUNTER — Inpatient Hospital Stay (HOSPITAL_COMMUNITY)
Admission: EM | Admit: 2010-11-15 | Discharge: 2010-11-19 | DRG: 813 | Disposition: A | Discharge status: Home / Self Care | Payer: Federal, State, Local not specified - PPO | Attending: Internal Medicine | Admitting: Internal Medicine

## 2010-11-15 ENCOUNTER — Encounter (HOSPITAL_COMMUNITY): Payer: Self-pay

## 2010-11-15 DIAGNOSIS — K2100 Gastro-esophageal reflux disease with esophagitis, without bleeding: Secondary | ICD-10-CM | POA: Diagnosis present

## 2010-11-15 DIAGNOSIS — D702 Other drug-induced agranulocytosis: Secondary | ICD-10-CM | POA: Diagnosis present

## 2010-11-15 DIAGNOSIS — K922 Gastrointestinal hemorrhage, unspecified: Secondary | ICD-10-CM

## 2010-11-15 DIAGNOSIS — T451X5A Adverse effect of antineoplastic and immunosuppressive drugs, initial encounter: Secondary | ICD-10-CM | POA: Diagnosis present

## 2010-11-15 DIAGNOSIS — R112 Nausea with vomiting, unspecified: Principal | ICD-10-CM | POA: Diagnosis present

## 2010-11-15 DIAGNOSIS — K219 Gastro-esophageal reflux disease without esophagitis: Secondary | ICD-10-CM | POA: Diagnosis present

## 2010-11-15 DIAGNOSIS — D630 Anemia in neoplastic disease: Secondary | ICD-10-CM | POA: Diagnosis present

## 2010-11-15 DIAGNOSIS — R109 Unspecified abdominal pain: Secondary | ICD-10-CM

## 2010-11-15 DIAGNOSIS — C189 Malignant neoplasm of colon, unspecified: Secondary | ICD-10-CM | POA: Diagnosis present

## 2010-11-15 DIAGNOSIS — D709 Neutropenia, unspecified: Secondary | ICD-10-CM | POA: Diagnosis present

## 2010-11-15 DIAGNOSIS — J209 Acute bronchitis, unspecified: Secondary | ICD-10-CM

## 2010-11-15 DIAGNOSIS — K2960 Other gastritis without bleeding: Secondary | ICD-10-CM | POA: Diagnosis present

## 2010-11-15 DIAGNOSIS — R791 Abnormal coagulation profile: Secondary | ICD-10-CM

## 2010-11-15 DIAGNOSIS — Z86711 Personal history of pulmonary embolism: Secondary | ICD-10-CM

## 2010-11-15 DIAGNOSIS — Z79899 Other long term (current) drug therapy: Secondary | ICD-10-CM

## 2010-11-15 DIAGNOSIS — D638 Anemia in other chronic diseases classified elsewhere: Secondary | ICD-10-CM

## 2010-11-15 DIAGNOSIS — Z903 Acquired absence of stomach [part of]: Secondary | ICD-10-CM

## 2010-11-15 DIAGNOSIS — Z86718 Personal history of other venous thrombosis and embolism: Secondary | ICD-10-CM

## 2010-11-15 DIAGNOSIS — K2210 Ulcer of esophagus without bleeding: Secondary | ICD-10-CM

## 2010-11-15 DIAGNOSIS — R509 Fever, unspecified: Secondary | ICD-10-CM

## 2010-11-15 HISTORY — DX: Reserved for inherently not codable concepts without codable children: IMO0001

## 2010-11-15 HISTORY — DX: Encounter for other specified aftercare: Z51.89

## 2010-11-15 LAB — COMPREHENSIVE METABOLIC PANEL
ALT: 6 U/L (ref 0–53)
AST: 10 U/L (ref 0–37)
Albumin: 3.8 g/dL (ref 3.5–5.2)
Alkaline Phosphatase: 96 U/L (ref 39–117)
Calcium: 9.4 mg/dL (ref 8.4–10.5)
GFR calc Af Amer: >60 mL/min (ref >60)
Potassium: 4.4 mEq/L (ref 3.5–5.1)
Sodium: 133 mEq/L — ABNORMAL LOW (ref 135–145)
Total Protein: 7 g/dL (ref 6.0–8.3)

## 2010-11-15 LAB — CBC
Hemoglobin: 11.3 g/dL — ABNORMAL LOW (ref 13.0–17.0)
MCH: 27.2 pg (ref 26.0–34.0)
MCHC: 34.1 g/dL (ref 30.0–36.0)
Platelets: 130 10*3/uL — ABNORMAL LOW (ref 150–400)

## 2010-11-15 LAB — D-DIMER, QUANTITATIVE: D-Dimer, Quant: >20 ug/mL-FEU — ABNORMAL HIGH (ref 0.00–0.48)

## 2010-11-15 LAB — DIFFERENTIAL
Basophils Absolute: 0 10*3/uL (ref 0.0–0.1)
Basophils Relative: 1 % (ref 0–1)
Eosinophils Absolute: 0 10*3/uL (ref 0.0–0.7)
Lymphocytes Relative: 65 % — ABNORMAL HIGH (ref 12–46)
Monocytes Absolute: 0.3 10*3/uL (ref 0.1–1.0)
Neutrophils Relative %: 14 % — ABNORMAL LOW (ref 43–77)

## 2010-11-15 LAB — URINALYSIS, ROUTINE W REFLEX MICROSCOPIC
Hgb urine dipstick: NEGATIVE
Specific Gravity, Urine: >1.03 — ABNORMAL HIGH (ref 1.005–1.030)
Urobilinogen, UA: 0.2 mg/dL (ref 0.0–1.0)
pH: 5.5 (ref 5.0–8.0)

## 2010-11-15 LAB — LACTIC ACID, PLASMA: Lactic Acid, Venous: 2 mmol/L (ref 0.5–2.2)

## 2010-11-15 MED ORDER — POLYETHYLENE GLYCOL 3350 17 G PO PACK
17.0000 g | PACK | Freq: Every day | ORAL | Status: DC
  Administered 2010-11-16: 17 g via ORAL
  Filled 2010-11-15: qty 1

## 2010-11-15 MED ORDER — VITAMIN B-1 100 MG PO TABS
100.0000 mg | ORAL_TABLET | Freq: Every day | ORAL | Status: DC
  Administered 2010-11-16 (×2): 100 mg via ORAL
  Filled 2010-11-15: qty 1

## 2010-11-15 MED ORDER — PROCHLORPERAZINE MALEATE 5 MG PO TABS: 10.0000 mg | ORAL_TABLET | Freq: Four times a day (QID) | ORAL | Status: DC | PRN

## 2010-11-15 MED ORDER — ACETAMINOPHEN 325 MG PO TABS: 650.0000 mg | ORAL_TABLET | Freq: Four times a day (QID) | ORAL | Status: DC | PRN

## 2010-11-15 MED ORDER — FLUCONAZOLE IN SODIUM CHLORIDE 200-0.9 MG/100ML-% IV SOLN
200.0000 mg | INTRAVENOUS | Status: DC
  Administered 2010-11-15 – 2010-11-17 (×3): 200 mg via INTRAVENOUS
  Filled 2010-11-15 (×4): qty 100

## 2010-11-15 MED ORDER — FLEET ENEMA 7-19 GM/118ML RE ENEM: 1.0000 | ENEMA | RECTAL | Status: DC | PRN

## 2010-11-15 MED ORDER — IOHEXOL 350 MG/ML SOLN
100.0000 mL | Freq: Once | INTRAVENOUS | Status: AC | PRN
  Administered 2010-11-15: 100 mL via INTRAVENOUS

## 2010-11-15 MED ORDER — SODIUM CHLORIDE 0.9 % IV SOLN
80.0000 mg | Freq: Two times a day (BID) | INTRAVENOUS | Status: DC
  Administered 2010-11-15 – 2010-11-16 (×2): 80 mg via INTRAVENOUS
  Filled 2010-11-15 (×4): qty 80

## 2010-11-15 MED ORDER — BISACODYL 10 MG RE SUPP: 10.0000 mg | RECTAL | Status: DC | PRN

## 2010-11-15 MED ORDER — ENOXAPARIN SODIUM 40 MG/0.4ML ~~LOC~~ SOLN
40.0000 mg | SUBCUTANEOUS | Status: DC
  Administered 2010-11-16: 40 mg via SUBCUTANEOUS
  Filled 2010-11-15: qty 0.4

## 2010-11-15 MED ORDER — HYDROMORPHONE HCL 1 MG/ML IJ SOLN
1.0000 mg | INTRAMUSCULAR | Status: DC | PRN
  Administered 2010-11-15 – 2010-11-16 (×4): 2 mg via INTRAVENOUS
  Administered 2010-11-16: 1 mg via INTRAVENOUS
  Administered 2010-11-16 (×3): 2 mg via INTRAVENOUS
  Administered 2010-11-16: 1 mg via INTRAVENOUS
  Administered 2010-11-17 (×9): 2 mg via INTRAVENOUS
  Administered 2010-11-18: 1 mg via INTRAVENOUS
  Administered 2010-11-18 – 2010-11-19 (×11): 2 mg via INTRAVENOUS
  Filled 2010-11-15 (×7): qty 2
  Filled 2010-11-15: qty 1
  Filled 2010-11-15 (×5): qty 2
  Filled 2010-11-15: qty 1
  Filled 2010-11-15 (×6): qty 2
  Filled 2010-11-15: qty 1
  Filled 2010-11-15 (×10): qty 2

## 2010-11-15 MED ORDER — SODIUM CHLORIDE 0.9 % IV BOLUS (SEPSIS)
1000.0000 mL | Freq: Once | INTRAVENOUS | Status: AC
  Administered 2010-11-15: 1000 mL via INTRAVENOUS

## 2010-11-15 MED ORDER — POTASSIUM CHLORIDE IN NACL 20-0.9 MEQ/L-% IV SOLN
INTRAVENOUS | Status: DC
  Administered 2010-11-15: 23:00:00 via INTRAVENOUS
  Administered 2010-11-16: 100 mL via INTRAVENOUS
  Administered 2010-11-17 – 2010-11-19 (×5): via INTRAVENOUS

## 2010-11-15 MED ORDER — SODIUM CHLORIDE 0.9 % IV BOLUS (SEPSIS)
500.0000 mL | Freq: Once | INTRAVENOUS | Status: AC
  Administered 2010-11-15: 16:00:00 via INTRAVENOUS

## 2010-11-15 MED ORDER — ONDANSETRON HCL 4 MG/2ML IJ SOLN
4.0000 mg | INTRAMUSCULAR | Status: DC | PRN
  Administered 2010-11-15 – 2010-11-19 (×13): 4 mg via INTRAVENOUS
  Filled 2010-11-15 (×12): qty 2

## 2010-11-15 MED ORDER — ONDANSETRON HCL 4 MG/2ML IJ SOLN
4.0000 mg | Freq: Once | INTRAMUSCULAR | Status: AC
  Administered 2010-11-15: 4 mg via INTRAVENOUS
  Filled 2010-11-15: qty 2

## 2010-11-15 MED ORDER — HYDROMORPHONE HCL 1 MG/ML IJ SOLN
INTRAMUSCULAR | Status: AC
  Filled 2010-11-15: qty 1

## 2010-11-15 MED ORDER — METOCLOPRAMIDE HCL 5 MG/ML IJ SOLN
10.0000 mg | Freq: Four times a day (QID) | INTRAMUSCULAR | Status: DC
  Administered 2010-11-15 – 2010-11-16 (×3): 10 mg via INTRAVENOUS
  Filled 2010-11-15 (×3): qty 2

## 2010-11-15 MED ORDER — TRAZODONE HCL 50 MG PO TABS: 25.0000 mg | ORAL_TABLET | Freq: Every evening | ORAL | Status: DC | PRN

## 2010-11-15 MED ORDER — ACETAMINOPHEN 650 MG RE SUPP: 650.0000 mg | Freq: Four times a day (QID) | RECTAL | Status: DC | PRN

## 2010-11-15 MED ORDER — LORAZEPAM 1 MG PO TABS: 1.0000 mg | ORAL_TABLET | ORAL | Status: DC | PRN

## 2010-11-15 MED ORDER — FLUCONAZOLE IN SODIUM CHLORIDE 200-0.9 MG/100ML-% IV SOLN
INTRAVENOUS | Status: AC
  Filled 2010-11-15: qty 100

## 2010-11-15 MED ORDER — HYDROMORPHONE HCL 2 MG/ML IJ SOLN
INTRAMUSCULAR | Status: AC
  Administered 2010-11-15: 21:00:00
  Filled 2010-11-15: qty 1

## 2010-11-15 MED ORDER — PANTOPRAZOLE SODIUM 40 MG IV SOLR
INTRAVENOUS | Status: AC
  Filled 2010-11-15: qty 80

## 2010-11-15 MED ORDER — ONDANSETRON HCL 4 MG/2ML IJ SOLN
INTRAMUSCULAR | Status: AC
  Filled 2010-11-15: qty 2

## 2010-11-15 MED ORDER — SODIUM CHLORIDE 0.9 % IV SOLN: INTRAVENOUS | Status: DC

## 2010-11-15 MED ORDER — SERTRALINE HCL 50 MG PO TABS
50.0000 mg | ORAL_TABLET | Freq: Every day | ORAL | Status: DC
  Administered 2010-11-16 – 2010-11-19 (×4): 50 mg via ORAL
  Filled 2010-11-15 (×4): qty 1

## 2010-11-15 MED ORDER — FILGRASTIM 300 MCG/ML IJ SOLN
INTRAMUSCULAR | Status: AC
  Filled 2010-11-15: qty 1

## 2010-11-15 MED ORDER — HYDROMORPHONE HCL 1 MG/ML IJ SOLN
0.5000 mg | Freq: Once | INTRAMUSCULAR | Status: AC
  Administered 2010-11-15: 0.5 mg via INTRAVENOUS

## 2010-11-15 MED ORDER — FOLIC ACID 1 MG PO TABS
1.0000 mg | ORAL_TABLET | Freq: Every day | ORAL | Status: DC
  Administered 2010-11-16: 1 mg via ORAL
  Filled 2010-11-15: qty 1

## 2010-11-15 MED ORDER — FILGRASTIM 300 MCG/ML IJ SOLN
300.0000 ug | INTRAMUSCULAR | Status: DC
  Administered 2010-11-15 – 2010-11-16 (×2): 300 ug via SUBCUTANEOUS
  Filled 2010-11-15 (×3): qty 1

## 2010-11-15 MED ORDER — HYDROMORPHONE HCL 1 MG/ML IJ SOLN
1.0000 mg | Freq: Once | INTRAMUSCULAR | Status: AC
  Administered 2010-11-15: 1 mg via INTRAVENOUS
  Filled 2010-11-15: qty 1

## 2010-11-15 NOTE — ED Notes (Signed)
Pt reports had chemo Wednesday and started feeling nauseated Friday.  Reports sharp chest pain radiating to r arm today with SOB and n/v.  Reports vomited and emesis was very dark.  Pt alert and oriented.  Generalized weakness.  Accessed pt's port a cath, was initially able to get some blood but not enough for labs.  No swelling around site, port flushed well.  EDP aware and says ok to use.  Pt reports last bm was yesterday and was normal, denies diarrhea.  Bowel sounds present.  Says is tender on right mid/lower abd but says is not new.  Family at bedside.

## 2010-11-15 NOTE — ED Notes (Signed)
Attempt to call report to [phone], Morrie Sheldon, RN reports she will call back when able to take report on this pt

## 2010-11-15 NOTE — H&P (Signed)
PCP:   DAVIDSON,ERIC, MD, MD   Oncologist: Glenford Peers, MD.  Chief Complaint:  Vomiting x days  HPI: 60 y/o AAM h/o ca colon s/p hemicolectomy, now receiving chemotherapy, received dose of chemotx last Monday, had an episode of vomiting 2 days later, then for the past 2 days has been having persistent nausea vomiting and abd pain. Vomitus contains black material, no blood; no syncope, occ dizziness. No fever, chest pain of SOB.  Eventually came to the ED for assistance and as well as being dehydrated was found to be severely neutropenic. WBC 1.6, down from >4 few days ago. .  Review of Systems:  The patient denies anorexia, fever, weight loss,, vision loss, decreased hearing, hoarseness, chest pain, syncope, dyspnea on exertion, peripheral edema, balance deficits, hemoptysis,  melena, hematochezia, hematuria, incontinence, genital sores, muscle weakness, suspicious skin lesions, transient blindness, difficulty walking, depression, unusual weight change, abnormal bleeding, enlarged lymph nodes, angioedema, and breast masses.  Past Medical History: Past Medical History  Diagnosis Date  . 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 Pulmonary embolism 02/2010  . Acid reflux   . Hypertension   . Osteoporosis   . Arthritis   . TIA (transient ischemic attack) 10/11  . ETOH abuse     quit 03/2010  . 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 Personal history of PE (pulmonary embolism) 10/01/2010  . Blood transfusion    Past Surgical History  Procedure Date  . Abdominal surgery   . Hernia repair     right inguinal  . Appendectomy 1980s  . Cholecystectomy 1980s  . Colon surgery     left  . Portacath placement   . Abdominal sugery     for bowel obstruction x 8  . Colon surgery 07/31/10  . Esophagogastroduodenoscopy 09/28/2010    Procedure: ESOPHAGOGASTRODUODENOSCOPY (EGD);  Surgeon: Corbin Ade, MD;  Location: [facility];  Service: Endoscopy;  Laterality:  N/A;    Medications: Prior to Admission medications   Medication Sig Start Date End Date Taking? Authorizing Provider  feeding supplement (ENSURE CLINICAL STRENGTH) LIQD Take 237 mLs by mouth 2 (two) times daily between meals. 10/01/10  Yes Elliot Cousin  folic acid (FOLVITE) 1 MG tablet Take 1 mg by mouth daily.     Yes Historical Provider, MD  HYDROcodone-acetaminophen (NORCO) 5-325 MG per tablet Take 1 tablet by mouth every 6 (six) hours as needed for pain. 10/26/10 11/26/10 Yes Thomas Kefalas, PA  LORazepam (ATIVAN) 1 MG tablet Take 1 mg by mouth every 4 (four) hours as needed.    Yes Historical Provider, MD  megestrol (MEGACE) 40 MG/ML suspension Take 200 mg by mouth 2 (two) times daily.     Yes Historical Provider, MD  olmesartan (BENICAR) 5 MG tablet Take 5 mg by mouth daily.     Yes Historical Provider, MD  omeprazole (PRILOSEC) 20 MG capsule Take 40 mg by mouth 2 (two) times daily.   10/01/10  Yes Elliot Cousin  prochlorperazine (COMPAZINE) 10 MG tablet Take 10 mg by mouth every 6 (six) hours as needed.     Yes Historical Provider, MD  sertraline (ZOLOFT) 50 MG tablet Take 50 mg by mouth daily.     Yes Historical Provider, MD  thiamine 100 MG tablet Take 100 mg by mouth daily.     Yes Historical Provider, MD  CYANOCOBALAMIN IJ Inject 1,000 mcg as directed every 30 (thirty) days.      Historical Provider, MD  Cyclobenzaprine HCl (FLEXERIL PO) Take 10 mg by mouth 3 (three) times daily as needed.      Historical Provider, MD    Allergies:  No Known Allergies  Social History:  reports that he has been smoking Cigarettes.  He has a 20 pack-year smoking history. He does not have any smokeless tobacco history on file. He reports that he does not drink alcohol or use illicit drugs.  Family History: Family History  Problem Relation Age of Onset  . Hypertension Mother   . Hypertension Father     Physical Exam: Filed Vitals:   11/15/10 1806 11/15/10 1855 11/15/10 1935 11/15/10 2200  BP:  184/103 151/110 160/105 176/104  Pulse: 90 82 76 79  Temp: 99 F (37.2 C)   98.2 F (36.8 C)  TempSrc: Oral   Oral  Resp: 20  13 24   Height:    5\' 9"  (1.753 m)  Weight:    57.153 kg (126 lb)  SpO2: 100%  100% 100%   General appearance: alert and cooperative Head: Normocephalic, without obvious abnormality, atraumatic Eyes: conjunctivae/corneas clear. Dry pink, PERRL,  Throat: lips, mucosa, dry; no oral candida.l Neck: no adenopathy, no carotid bruit, no JVD, supple. Resp: clear to auscultation bilaterally Chest wall: no tenderness; Left chest port-o-cath Cardio: regular rate and rhythm, S1, S2 normal, no murmur, click, rub or gallop NW:GNFA epigastric tenderness; bowel sounds normal;  Extremities: extremities normal, atraumatic, no cyanosis or edema Skin: Skin color, texture, turgor normal. No rashes or lesions Neurologic: Grossly normal   Labs on Admission:   [facility] 11/15/10 1551  NA 133*  K 4.4  CL 98  CO2 24  GLUCOSE 123*  BUN 15  CREATININE 0.81  CALCIUM 9.4  MG --  PHOS --    Basename 11/15/10 1551  AST 10  ALT 6  ALKPHOS 96  BILITOT 0.5  PROT 7.0  ALBUMIN 3.8    Basename 11/15/10 1551  LIPASE 18  AMYLASE --    Basename 11/15/10 1551  WBC 1.6*  NEUTROABS 0.2*  HGB 11.3*  HCT 33.1*  MCV 79.6  PLT 130*     Radiological Exams on Admission: Ct Angio Chest W/cm &/or Wo Cm  11/15/2010  *RADIOLOGY REPORT*  Clinical Data:  Chest pain, cough.  History of colon cancer.  CT ANGIOGRAPHY CHEST WITH CONTRAST  Technique:  Multidetector CT imaging of the chest was performed using the standard protocol during bolus administration of intravenous contrast.  Multiplanar CT image reconstructions including MIPs were obtained to evaluate the vascular anatomy.  Contrast:  100 ml Omnipaque 350 IV contrast  Comparison:  09/29/2010 chest radiograph, chest CT 08/28/2010  Findings:  Left sided Port-A-Cath in place, with tip in the mid SVC.  The study is of adequate  technical quality for evaluation for pulmonary embolism up to and including the 3rd order pulmonary arteries.  Apparent filling defect within the branch points of the left lower lobe main pulmonary artery seen on one view only is most compatible with artifact due to volume averaging at the pulmonary artery branch points.  No filling defect is seen to suggest acute pulmonary embolism up to and including the 3rd order pulmonary arteries.  Heart size is normal.  No pericardial or pleural effusion.  8 mm pretracheal lymph node is slightly smaller, measuring 0.6 cm image 34.  There is diffuse esophageal wall thickening with internal air fluid level.  Wall thickening measures approximately 5 mm image 46.  This is present to the level of the  gastroesophageal junction. This is new since the prior exam.  Lung fields demonstrate stable 2-3 mm right upper lobe pleural parenchymal nodular opacities, for example images 14, 15, and 17. Curvilinear right lower lobe scarring is stable.  2 mm left lower lobe pulmonary nodule image 56 is stable.  Central airways are patent.  No acute osseous abnormality.  Mild rightward curvature centered at T4 is stable.  Review of the MIP images confirms the above findings.  IMPRESSION: No acute cardiopulmonary process.  Specifically, no acute pulmonary embolism is identified up to and including the 3rd order pulmonary arteries.  Stable nonspecific 2-3 mm bilateral pulmonary parenchymal nodules. These are amenable to follow-up per the patient's overall staging treatment plan.  Diffuse esophageal wall thickening which is new since the prior study and may represent reflux esophagitis, other infection/inflammation such as candidiasis if the patient is immunosuppressed, or much less likely neoplastic involvement given the development since the recent previous exam.  Original Report Authenticated By: Harrel Lemon, M.D.   Dg Abd Acute W/chest  11/15/2010  *RADIOLOGY REPORT*  Clinical Data:  Shortness of breath.  Abdominal pain.  Weakness. Previous colon cancer resection and chemotherapy.  ACUTE ABDOMEN SERIES (ABDOMEN 2 VIEW & CHEST 1 VIEW)  Comparison: 09/26/2010.  Findings: The heart remains normal in size and the lungs are clear and remain mildly hyperexpanded.  Stable left subclavian porta catheter.  Dilated loops of colon and small bowel with gas seen through to the rectum.  Multiple surgical clips and staples in the abdomen bilaterally.  No free peritoneal air.  Diffuse osteopenia.  IMPRESSION:  1.  Diffuse colonic and small probable ileus. 2.  Mild changes of COPD.  Original Report Authenticated By: Darrol Angel, M.D.    Assessment/Plan Present on Admission:  .Nausea with vomiting Dehydration .Esophageal reflux disease . Pancytopenia .Neutropenia .Adenocarcinoma of colon with mucinous features  PLAN: Will admit for symptomatic treatment of his gastritis/GERD, and for rehydration. He has no oral candida, but because of his debility and the CT evidence of esophagitis, will empirically start diflucan and reassess.  His severe neutropenia is likely form chemotx so will start Neupogen and consult hs oncologist for assistance with management.  Other plans as per orders.  Weslee Fogg 11/15/2010, 11:26 PM

## 2010-11-15 NOTE — ED Notes (Signed)
Pt reports nausea since Friday.  Pt reports waking this a.m with chest pain to the center of his chest.  Pt describes the pain as "sharp, and constant".  Pt also reports diaphoresis, and some sob.

## 2010-11-15 NOTE — ED Provider Notes (Cosign Needed)
History  Scribed for Dr. Effie Shy, the patient was seen in room 1. The chart was scribed by Gilman Schmidt. The patients care was started at 1509  CSN: [id number] Arrival date & time: 11/15/2010  2:51 PM  Chief Complaint  Patient presents with  . Chest Pain   HPI [name] is a 60 y.o. male with a history of multiple illnesses including colon cancer, acid reflux, TIA, and hypertension who presents to the Emergency Department complaining of chest pain that began this morning. Additionally, the patient reports abdominal pain and nausea that have been present since Friday. Patient reports that nausea is normal after chemotherapy treatment for colon cancer. Patient states that he vomited black blood 2x today. Associated symptoms of dizziness (alleviated by laying down), back pain and cough but denies any trouble breathing or fever. There are no other associated symptoms and no other alleviating or aggravating factors.   Patient denies being on any blood thinners since July. Oncologist: Dr. Laurie Panda PCP: Dr. Ignacia Palma  HPI ELEMENTS:  Location: chest Onset: this morning Duration: persistent since onset   Modifying factors: dizziness is alleviated by laying down Context: as above  Associated symptoms: dizziness, back pain and cough but denies any trouble breathing or fever  PAST MEDICAL HISTORY:  Past Medical History  Diagnosis Date  . 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 Pulmonary embolism 02/2010  . Acid reflux   . Hypertension   . Osteoporosis   . Arthritis   . TIA (transient ischemic attack) 10/11  . ETOH abuse     quit 03/2010  . 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 Personal history of PE (pulmonary embolism) 10/01/2010    PAST SURGICAL HISTORY:  Past Surgical History  Procedure Date  . Abdominal surgery   . Hernia repair     right inguinal  . Appendectomy 1980s  . Cholecystectomy 1980s  . Colon surgery     left  . Portacath placement   .  Abdominal sugery     for bowel obstruction x 8  . Colon surgery 07/31/10  . Esophagogastroduodenoscopy 09/28/2010    Procedure: ESOPHAGOGASTRODUODENOSCOPY (EGD);  Surgeon: Corbin Ade, MD;  Location: [facility];  Service: Endoscopy;  Laterality: N/A;   MEDICATIONS:  Previous Medications   CYANOCOBALAMIN IJ    Inject 1,000 mcg as directed every 30 (thirty) days.     CYCLOBENZAPRINE HCL (FLEXERIL PO)    Take 10 mg by mouth 3 (three) times daily as needed.     FEEDING SUPPLEMENT (ENSURE CLINICAL STRENGTH) LIQD    Take 237 mLs by mouth 2 (two) times daily between meals.   FOLIC ACID (FOLVITE) 1 MG TABLET    Take 1 mg by mouth daily.     HYDROCODONE-ACETAMINOPHEN (NORCO) 5-325 MG PER TABLET    Take 1 tablet by mouth every 6 (six) hours as needed for pain.   LORAZEPAM (ATIVAN) 1 MG TABLET    Take 1 mg by mouth every 4 (four) hours as needed.    MEGESTROL (MEGACE) 40 MG/ML SUSPENSION    Take 200 mg by mouth 2 (two) times daily.     OLMESARTAN (BENICAR) 5 MG TABLET    Take 5 mg by mouth daily.     PROCHLORPERAZINE (COMPAZINE) 10 MG TABLET    Take 10 mg by mouth every 6 (six) hours as needed.     SERTRALINE (ZOLOFT) 50 MG TABLET    Take 50 mg by mouth daily.  THIAMINE 100 MG TABLET    Take 100 mg by mouth daily.       ALLERGIES:  Allergies as of 11/15/2010  . (No Known Allergies)     FAMILY HISTORY:   Family History  Problem Relation Age of Onset  . Hypertension Mother   . Hypertension Father      SOCIAL HISTORY: History  Substance Use Topics  . Smoking status: Current Everyday Smoker -- 0.5 packs/day for 40 years    Types: Cigarettes  . Smokeless tobacco: Not on file  . Alcohol Use: No     hx heavy use x 12     Review of Systems  Constitutional: Positive for diaphoresis and appetite change (hasnt eaten in 3 days).  Respiratory: Positive for cough and shortness of breath.   Cardiovascular: Positive for chest pain.  Gastrointestinal: Positive for nausea, vomiting and  abdominal pain.  Neurological: Positive for dizziness.  All other systems reviewed and are negative.    Physical Exam  BP 150/95  Pulse 91  Temp(Src) 98.2 F (36.8 C) (Oral)  Ht 5\' 9"  (1.753 m)  Wt 120 lb (54.432 kg)  BMI 17.72 kg/m2  SpO2 98%  Physical Exam  Constitutional: He is oriented to person, place, and time. He appears well-developed and well-nourished.  HENT:  Head: Normocephalic and atraumatic.  Right Ear: External ear normal.  Left Ear: External ear normal.  Eyes: Conjunctivae and EOM are normal. Pupils are equal, round, and reactive to light.  Neck: Normal range of motion and phonation normal. Neck supple. No tracheal tenderness present.  Cardiovascular: Normal rate, regular rhythm, normal heart sounds and intact distal pulses.   Pulmonary/Chest: Effort normal and breath sounds normal. He exhibits no tenderness, no bony tenderness and no deformity.  Abdominal: Soft. Normal appearance and bowel sounds are normal. There is no hepatosplenomegaly, splenomegaly or hepatomegaly. There is tenderness (mildly diffusely tender).  Musculoskeletal: Normal range of motion.  Neurological: He is alert and oriented to person, place, and time. He has normal strength and normal reflexes. No cranial nerve deficit or sensory deficit. He exhibits normal muscle tone. Coordination normal.  Skin: Skin is warm, dry and intact.  Psychiatric: He has a normal mood and affect. His behavior is normal. Judgment and thought content normal.    OTHER DATA REVIEWED: Nursing notes, vital signs, and past medical records reviewed.  DIAGNOSTIC STUDIES: Oxygen Saturation is 98% on room air, normal by my interpretation.    LABS  Results for orders placed during the hospital encounter of 11/15/10  CBC      Component Value Range   WBC 1.6 (*) 4.0 - 10.5 (K/uL)   RBC 4.16 (*) 4.22 - 5.81 (MIL/uL)   Hemoglobin 11.3 (*) 13.0 - 17.0 (g/dL)   HCT 16.1 (*) 09.6 - 52.0 (%)   MCV 79.6  78.0 - 100.0 (fL)    MCH 27.2  26.0 - 34.0 (pg)   MCHC 34.1  30.0 - 36.0 (g/dL)   RDW 04.5 (*) 40.9 - 15.5 (%)   Platelets 130 (*) 150 - 400 (K/uL)  DIFFERENTIAL      Component Value Range   Neutrophils Relative 14 (*) 43 - 77 (%)   Lymphocytes Relative 65 (*) 12 - 46 (%)   Monocytes Relative 19 (*) 3 - 12 (%)   Eosinophils Relative 1  0 - 5 (%)   Basophils Relative 1  0 - 1 (%)   Neutro Abs 0.2 (*) 1.7 - 7.7 (K/uL)   Lymphs Abs 1.1  0.7 - 4.0 (K/uL)   Monocytes Absolute 0.3  0.1 - 1.0 (K/uL)   Eosinophils Absolute 0.0  0.0 - 0.7 (K/uL)   Basophils Absolute 0.0  0.0 - 0.1 (K/uL)   WBC Morphology WHITE COUNT CONFIRMED ON SMEAR    COMPREHENSIVE METABOLIC PANEL      Component Value Range   Sodium 133 (*) 135 - 145 (mEq/L)   Potassium 4.4  3.5 - 5.1 (mEq/L)   Chloride 98  96 - 112 (mEq/L)   CO2 24  19 - 32 (mEq/L)   Glucose, Bld 123 (*) 70 - 99 (mg/dL)   BUN 15  6 - 23 (mg/dL)   Creatinine, Ser 1.61  0.50 - 1.35 (mg/dL)   Calcium 9.4  8.4 - 09.6 (mg/dL)   Total Protein 7.0  6.0 - 8.3 (g/dL)   Albumin 3.8  3.5 - 5.2 (g/dL)   AST 10  0 - 37 (U/L)   ALT 6  0 - 53 (U/L)   Alkaline Phosphatase 96  39 - 117 (U/L)   Total Bilirubin 0.5  0.3 - 1.2 (mg/dL)   GFR calc non Af Amer >60  >60 (mL/min)   GFR calc Af Amer >60  >60 (mL/min)  LIPASE, BLOOD      Component Value Range   Lipase 18  11 - 59 (U/L)  D-DIMER, QUANTITATIVE      Component Value Range   D-Dimer, Quant >20.00 (*) 0.00 - 0.48 (ug/mL-FEU)  LACTIC ACID, PLASMA      Component Value Range   Lactic Acid, Venous 2.0  0.5 - 2.2 (mmol/L)   laboratory interpretation:  Pancytopenia with neutropenia. No apparent acute focus for infection.   RADIOLOGY:  XRAY:   CT Angio Chest. Reviewed by me.  IMPRESSION: No acute cardiopulmonary process. Specifically, no acute pulmonary embolism is identified up to and including the 3rd order pulmonary arteries.  Stable nonspecific 2-3 mm bilateral pulmonary parenchymal nodules. These are amenable to  follow-up per the patient's overall staging treatment plan.  Diffuse esophageal wall thickening which is new since the prior study and may represent reflux esophagitis, other infection/inflammation such as candidiasis if the patient is immunosuppressed, or much less likely neoplastic involvement given the development since the recent previous exam.  Original Report Authenticated By: Harrel Lemon, M.D.   Abdominal Acute W/Chest Abdomen 2 View and Chest 1 View  Reviewed by me. IMPRESSION: 1. Diffuse colonic and small probable ileus. 2. Mild changes of COPD. Original Report Authenticated By: Darrol Angel, M.D.   ED COURSE / COORDINATION OF CARE: 1509-Patient evaluated by ED physician, Dilaudid, Omnipaque, Zofran, CXR, labs, and UA, ordered 1640- Recheck by ED physician. Referred to f/u with gastroenterologist. Patient complains of continued nausea and chest pain and request admit.    Date: 11/15/2010  Rate: 92  Rhythm: normal sinus rhythm  QRS Axis: normal  Intervals: normal  ST/T Wave abnormalities: normal  Conduction Disutrbances:none  Narrative Interpretation: left atrial enlargement, LVH  Old EKG Reviewed: changes noted  MDM: Pancytopenia with vomiting and upper GI bleeding. Patient is hemodynamically stable without overt signs of infection. His possible new nodules in the chest that could be associated with his colon cancer however that is not clear at this time. Patient will be admitted admitted for monitoring and further assessment as indicated.  PLAN:  Admission   CONDITION ON ADMISSION: Stable  MEDICATIONS GIVEN IN THE E.D.  Medications  0.9 %  sodium chloride infusion (500 mL Intravenous Rate/Dose Change 11/15/10 1545)  ondansetron [facility]) injection 4 mg (4 mg Intravenous Given 11/15/10 1543)  sodium chloride 0.9 % bolus 500 mL ( mL Intravenous Given 11/15/10 1545)  HYDROmorphone (DILAUDID) injection 1 mg (1 mg Intravenous Given 11/15/10 1544)     DISCHARGE MEDICATIONS: New Prescriptions   No medications on file   ED Course  Procedures      I personally performed the services described in this documentation, which was scribed in my presence. The recorded information has been reviewed and considered. [name]   [name], MD 11/15/10 [phone]

## 2010-11-16 LAB — DIFFERENTIAL
Basophils Relative: 1 % (ref 0–1)
Eosinophils Absolute: 0 10*3/uL (ref 0.0–0.7)
Eosinophils Relative: 1 % (ref 0–5)
Monocytes Absolute: 0.3 10*3/uL (ref 0.1–1.0)
Monocytes Relative: 13 % — ABNORMAL HIGH (ref 3–12)
Neutro Abs: 0.6 10*3/uL — ABNORMAL LOW (ref 1.7–7.7)

## 2010-11-16 LAB — BASIC METABOLIC PANEL
BUN: 11 mg/dL (ref 6–23)
CO2: 21 mEq/L (ref 19–32)
Chloride: 102 mEq/L (ref 96–112)
Creatinine, Ser: 0.63 mg/dL (ref 0.50–1.35)
Glucose, Bld: 102 mg/dL — ABNORMAL HIGH (ref 70–99)
Potassium: 4 mEq/L (ref 3.5–5.1)

## 2010-11-16 LAB — CBC
HCT: 27.7 % — ABNORMAL LOW (ref 39.0–52.0)
Hemoglobin: 9.4 g/dL — ABNORMAL LOW (ref 13.0–17.0)
MCH: 27.3 pg (ref 26.0–34.0)
MCHC: 33.9 g/dL (ref 30.0–36.0)
MCV: 80.5 fL (ref 78.0–100.0)
RDW: 17.6 % — ABNORMAL HIGH (ref 11.5–15.5)

## 2010-11-16 MED ORDER — PROMETHAZINE HCL 25 MG/ML IJ SOLN
12.5000 mg | Freq: Four times a day (QID) | INTRAMUSCULAR | Status: DC | PRN
  Administered 2010-11-18 – 2010-11-19 (×3): 25 mg via INTRAVENOUS
  Filled 2010-11-16 (×3): qty 1

## 2010-11-16 MED ORDER — SODIUM CHLORIDE 0.9 % IJ SOLN
INTRAMUSCULAR | Status: AC
  Administered 2010-11-16: 10 mL
  Filled 2010-11-16: qty 10

## 2010-11-16 MED ORDER — SODIUM CHLORIDE 0.9 % IJ SOLN
INTRAMUSCULAR | Status: AC
  Filled 2010-11-16: qty 3

## 2010-11-16 MED ORDER — LORAZEPAM 2 MG/ML IJ SOLN
1.0000 mg | Freq: Four times a day (QID) | INTRAMUSCULAR | Status: DC | PRN
  Administered 2010-11-16 – 2010-11-19 (×2): 1 mg via INTRAVENOUS
  Filled 2010-11-16 (×2): qty 1

## 2010-11-16 MED ORDER — FILGRASTIM 300 MCG/ML IJ SOLN
INTRAMUSCULAR | Status: AC
  Filled 2010-11-16: qty 1

## 2010-11-16 MED ORDER — PANTOPRAZOLE SODIUM 40 MG IV SOLR
40.0000 mg | Freq: Two times a day (BID) | INTRAVENOUS | Status: DC
  Administered 2010-11-16 – 2010-11-19 (×6): 40 mg via INTRAVENOUS
  Filled 2010-11-16 (×6): qty 40

## 2010-11-16 MED ORDER — BIOTENE DRY MOUTH MT LIQD: OROMUCOSAL | Status: DC | PRN

## 2010-11-16 MED ORDER — METOCLOPRAMIDE HCL 5 MG/ML IJ SOLN: 10.0000 mg | Freq: Four times a day (QID) | INTRAMUSCULAR | Status: DC | PRN

## 2010-11-16 NOTE — Progress Notes (Signed)
Chart and old records reviewed.  Subjective: The patient is still significantly nauseated. Zofran not helping much. Reglan not helping. He takes Ativan as needed at home sometimes for nausea. He continues to have burning chest pain. He has had no emesis. He's had no bowel movements. He's had no fevers or chills.  Objective: Vital signs in last 24 hours: Filed Vitals:   11/16/10 0100 11/16/10 0500 11/16/10 0923 11/16/10 1443  BP:  158/88  146/88  Pulse:  91 100 86  Temp:  98.8 F (37.1 C)  98.4 F (36.9 C)  TempSrc:  Oral    Resp:  16  18  Height:      Weight:      SpO2: 100% 98% 98% 99%   Weight change:   Intake/Output Summary (Last 24 hours) at 11/16/10 1624 Last data filed at 11/16/10 1000  Gross per 24 hour  Intake   1772 ml  Output    250 ml  Net   1522 ml   Gen.: Uncomfortable, cachectic, chronically ill-appearing Lungs clear to auscultation bilaterally without wheezes rhonchi or rales HEENT no thrush. Slightly dry mucous membranes. Cardiovascular regular rate and rhythm without murmurs gallops rubs  abdomen is scaphoid soft mild epigastric tenderness Extremities no clubbing cyanosis or edema Lab Results: Basic Metabolic Panel:  Lab 11/16/10 0865 11/15/10 1551  NA 132* 133*  K 4.0 4.4  CL 102 98  CO2 21 24  GLUCOSE 102* 123*  BUN 11 15  CREATININE 0.63 0.81  CALCIUM 8.7 9.4  MG 1.7 --  PHOS -- --   Liver Function Tests:  Lab 11/15/10 1551  AST 10  ALT 6  ALKPHOS 96  BILITOT 0.5  PROT 7.0  ALBUMIN 3.8    Lab 11/15/10 1551  LIPASE 18  AMYLASE --   CBC:  Lab 11/16/10 0511 11/16/10 0442 11/15/10 1551  WBC -- 2.3* 1.6*  NEUTROABS 0.6* -- 0.2*  HGB -- 9.4* 11.3*  HCT -- 27.7* 33.1*  MCV -- 80.5 79.6  PLT -- 113* 130*   Cardiac Enzymes: No results found for this basename: CKTOTAL:3,CKMB:3,CKMBINDEX:3,TROPONINI:3 in the last 168 hours BNP: No results found for this basename: POCBNP:3 in the last 168 hours D-Dimer:  Lab 11/15/10 1551    DDIMER >20.00*   CBG: No results found for this basename: GLUCAP:6 in the last 168 hours Hemoglobin A1C: No results found for this basename: HGBA1C in the last 168 hours Fasting Lipid Panel: No results found for this basename: CHOL,HDL,LDLCALC,TRIG,CHOLHDL,LDLDIRECT in the last 784 hours Thyroid Function Tests: No results found for this basename: TSH,T4TOTAL,FREET4,T3FREE,THYROIDAB in the last 168 hours Anemia Panel: No results found for this basename: VITAMINB12,FOLATE,FERRITIN,TIBC,IRON,RETICCTPCT in the last 168 hours   Micro Results: No results found for this or any previous visit (from the past 240 hour(s)).  Scheduled Meds:   . filgrastim  300 mcg Subcutaneous Q24H  . fluconazole (DIFLUCAN) IV  200 mg Intravenous Q24H  . HYDROmorphone      . HYDROmorphone      . HYDROmorphone  0.5 mg Intravenous Once  . ondansetron      . pantoprazole (PROTONIX) IV  40 mg Intravenous Q12H  . sertraline  50 mg Oral Daily  . sodium chloride  1,000 mL Intravenous Once  . sodium chloride  500 mL Intravenous Once  . DISCONTD: enoxaparin  40 mg Subcutaneous Q24H  . DISCONTD: folic acid  1 mg Oral Daily  . DISCONTD: metoCLOPramide (REGLAN) injection  10 mg Intravenous Q6H  . DISCONTD: pantoprazole (  PROTONIX) IV  80 mg Intravenous Q12H  . DISCONTD: polyethylene glycol  17 g Oral Daily  . DISCONTD: vitamin B-1  100 mg Oral Daily   Continuous Infusions:   . 0.9 % NaCl with KCl 20 mEq / L 100 mL (11/16/10 1324)  . DISCONTD: sodium chloride 500 mL (11/15/10 1545)  . DISCONTD: sodium chloride 0.9 % 1,000 mL with potassium chloride 20 mEq infusion     PRN Meds:.acetaminophen, acetaminophen, antiseptic oral rinse, bisacodyl, HYDROmorphone (DILAUDID) injection, iohexol, LORazepam, metoCLOPramide (REGLAN) injection, ondansetron, promethazine, sodium phosphate, traZODone, DISCONTD: LORazepam, DISCONTD: prochlorperazine  Assessment/Plan: Active Problems: Stage IIIB adenocarcinoma of the colon,  status post 4 cycles of FOLFOX  Nausea with vomiting with esophagitis. He was hospitalized last month and had an EGD which showed erosive reflux esophagitis. He had been on Coumadin at that time and was supratherapeutic. He was started on empiric Diflucan by admitting physician but has no evidence of oral thrush. I will continue it for now. he's still significantly symptomatic but has had no further coffee ground emesis. I will stop the Reglan as it is not helping. Start Phenergan as needed continue Zofran as needed and give IV Ativan as needed for nausea. He reports that he had nausea at home that was treated effectively with oral Ativan. Continue twice-daily IV protonic. Sequential compression devices only do to coffee ground emesis. Monitor hemoglobin. Hold off on EGD for now. Continue IV fluids clear liquids and supportive care.    Esophageal reflux disease   Neutropenia improved but his ANC is still only about 500. Continue neutropenic precautions.   Multifactorial anemia: Secondary to scant coffee-ground emesis, dilution, chronic disease and chemotherapy, history of B12 deficiency. Monitor  History of pulmonary embolus    LOS: 1 day   Evoleht Hovatter L 11/16/2010, 4:24 PM

## 2010-11-16 NOTE — Progress Notes (Signed)
[facility] Chart Review Completed  

## 2010-11-17 LAB — CBC
HCT: 27.1 % — ABNORMAL LOW (ref 39.0–52.0)
MCV: 81.6 fL (ref 78.0–100.0)
RDW: 17.8 % — ABNORMAL HIGH (ref 11.5–15.5)
WBC: 5.8 10*3/uL (ref 4.0–10.5)

## 2010-11-17 MED ORDER — SUCRALFATE 1 GM/10ML PO SUSP
1.0000 g | Freq: Three times a day (TID) | ORAL | Status: DC
  Administered 2010-11-17 – 2010-11-19 (×7): 1 g via ORAL
  Filled 2010-11-17 (×7): qty 10

## 2010-11-17 MED ORDER — SODIUM CHLORIDE 0.9 % IJ SOLN
INTRAMUSCULAR | Status: AC
  Administered 2010-11-17: 10 mL
  Filled 2010-11-17: qty 10

## 2010-11-17 MED ORDER — SODIUM CHLORIDE 0.9 % IJ SOLN
INTRAMUSCULAR | Status: AC
  Administered 2010-11-17: 06:00:00
  Filled 2010-11-17: qty 10

## 2010-11-17 NOTE — Progress Notes (Signed)
Subjective: Slightly improved today. Able to tolerate small amounts of clear liquids. He's still having iodine aphasia. He's not ready to advance his diet. No new complaints.  Objective: Vital signs in last 24 hours: Filed Vitals:   11/16/10 1443 11/16/10 2151 11/17/10 0521 11/17/10 1443  BP: 146/88 142/85 138/90 121/73  Pulse: 86 78 95 81  Temp: 98.4 F (36.9 C) 98.6 F (37 C) 98.9 F (37.2 C) 98.3 F (36.8 C)  TempSrc:  Oral Oral   Resp: 18 18 18 20   Height:      Weight:      SpO2: 99% 99% 99% 100%   Weight change:   Intake/Output Summary (Last 24 hours) at 11/17/10 1828 Last data filed at 11/17/10 0900  Gross per 24 hour  Intake 2571.67 ml  Output    550 ml  Net 2021.67 ml   Gen.: More comfortable today., cachectic, chronically ill-appearing Lungs clear to auscultation bilaterally without wheezes rhonchi or rales HEENT no thrush. Slightly dry mucous membranes. Cardiovascular regular rate and rhythm without murmurs gallops rubs  abdomen is scaphoid soft mild epigastric tenderness Extremities no clubbing cyanosis or edema Lab Results: Basic Metabolic Panel:  Lab 11/16/10 1610 11/15/10 1551  NA 132* 133*  K 4.0 4.4  CL 102 98  CO2 21 24  GLUCOSE 102* 123*  BUN 11 15  CREATININE 0.63 0.81  CALCIUM 8.7 9.4  MG 1.7 --  PHOS -- --   Liver Function Tests:  Lab 11/15/10 1551  AST 10  ALT 6  ALKPHOS 96  BILITOT 0.5  PROT 7.0  ALBUMIN 3.8    Lab 11/15/10 1551  LIPASE 18  AMYLASE --   CBC:  Lab 11/17/10 0522 11/16/10 0511 11/16/10 0442 11/15/10 1551  WBC 5.8 -- 2.3* --  NEUTROABS -- 0.6* -- 0.2*  HGB 8.9* -- 9.4* --  HCT 27.1* -- 27.7* --  MCV 81.6 -- 80.5 --  PLT 101* -- 113* --   Cardiac Enzymes: No results found for this basename: CKTOTAL:3,CKMB:3,CKMBINDEX:3,TROPONINI:3 in the last 168 hours BNP: No results found for this basename: POCBNP:3 in the last 168 hours D-Dimer:  Lab 11/15/10 1551  DDIMER >20.00*   CBG: No results found for  this basename: GLUCAP:6 in the last 168 hours Hemoglobin A1C: No results found for this basename: HGBA1C in the last 168 hours Fasting Lipid Panel: No results found for this basename: CHOL,HDL,LDLCALC,TRIG,CHOLHDL,LDLDIRECT in the last 960 hours Thyroid Function Tests: No results found for this basename: TSH,T4TOTAL,FREET4,T3FREE,THYROIDAB in the last 168 hours Anemia Panel: No results found for this basename: VITAMINB12,FOLATE,FERRITIN,TIBC,IRON,RETICCTPCT in the last 168 hours   Micro Results: No results found for this or any previous visit (from the past 240 hour(s)).  Scheduled Meds:    . fluconazole (DIFLUCAN) IV  200 mg Intravenous Q24H  . pantoprazole (PROTONIX) IV  40 mg Intravenous Q12H  . sertraline  50 mg Oral Daily  . sodium chloride      . sodium chloride      . sodium chloride      . sucralfate  1 g Oral TID WC & HS  . DISCONTD: filgrastim  300 mcg Subcutaneous Q24H   Continuous Infusions:    . 0.9 % NaCl with KCl 20 mEq / L 100 mL/hr at 11/17/10 1002   PRN Meds:.acetaminophen, acetaminophen, antiseptic oral rinse, bisacodyl, HYDROmorphone (DILAUDID) injection, LORazepam, metoCLOPramide (REGLAN) injection, ondansetron, promethazine, sodium phosphate, traZODone  Assessment/Plan: Active Problems: Stage IIIB adenocarcinoma of the colon, status post 4 cycles of FOLFOX  Nausea with vomiting with esophagitis. He was hospitalized last month and had an EGD which showed erosive reflux esophagitis. He had been on Coumadin at that time and was supratherapeutic. He was started on empiric Diflucan by admitting physician but has no evidence of oral thrush. I will continue it for now. he's still significantly symptomatic but has had no further coffee ground emesis. I will stop the Reglan as it is not helping. Start Phenergan as needed continue Zofran as needed and give IV Ativan as needed for nausea. He reports that he had nausea at home that was treated effectively with oral  Ativan. Continue twice-daily IV protonic. Sequential compression devices only do to coffee ground emesis. Monitor hemoglobin. Hold off on EGD for now. Continue IV fluids clear liquids and supportive care. He is still having significant odynophagia. I will start Carafate slurry.   Esophageal reflux disease   Neutropenia resolved. Discontinue neutropenic precautions.  Multifactorial anemia: Secondary to scant coffee-ground emesis, dilution, chronic disease and chemotherapy, history of B12 deficiency. Monitor  History of pulmonary embolus    LOS: 2 days   SULLIVAN,CORINNA L 11/17/2010, 6:28 PM

## 2010-11-18 DIAGNOSIS — C189 Malignant neoplasm of colon, unspecified: Secondary | ICD-10-CM

## 2010-11-18 LAB — CBC
HCT: 24.8 % — ABNORMAL LOW (ref 39.0–52.0)
MCHC: 33.1 g/dL (ref 30.0–36.0)
MCV: 81.6 fL (ref 78.0–100.0)
RDW: 17.9 % — ABNORMAL HIGH (ref 11.5–15.5)

## 2010-11-18 LAB — BASIC METABOLIC PANEL
BUN: 4 mg/dL — ABNORMAL LOW (ref 6–23)
Chloride: 102 mEq/L (ref 96–112)
Creatinine, Ser: 0.68 mg/dL (ref 0.50–1.35)
GFR calc Af Amer: >60 mL/min (ref >60)

## 2010-11-18 MED ORDER — SODIUM CHLORIDE 0.9 % IJ SOLN
INTRAMUSCULAR | Status: AC
  Administered 2010-11-18: 10 mL
  Filled 2010-11-18: qty 10

## 2010-11-18 MED ORDER — SODIUM CHLORIDE 0.9 % IJ SOLN
INTRAMUSCULAR | Status: AC
  Administered 2010-11-18: 03:00:00
  Filled 2010-11-18: qty 10

## 2010-11-18 MED ORDER — SODIUM CHLORIDE 0.9 % IJ SOLN
INTRAMUSCULAR | Status: AC
  Filled 2010-11-18: qty 10

## 2010-11-18 MED ORDER — SODIUM CHLORIDE 0.9 % IJ SOLN
INTRAMUSCULAR | Status: AC
  Administered 2010-11-18: 05:00:00
  Filled 2010-11-18: qty 10

## 2010-11-18 NOTE — Progress Notes (Signed)
Subjective: Still complaining of significant nausea and a small amount of emesis.  Objective: Vital signs in last 24 hours: Temp:  [97.7 F (36.5 C)-98.7 F (37.1 C)] 98.7 F (37.1 C) (08/29 8119) Pulse Rate:  [80-83] 83  (08/29 0613) Resp:  [20] 20  (08/29 0613) BP: (121-129)/(73-86) 124/79 mmHg (08/29 0613) SpO2:  [96 %-100 %] 96 % (08/29 1478) Weight change:  Last BM Date: 11/13/10  Intake/Output from previous day: 08/28 0701 - 08/29 0700 In: 2095 [P.O.:720; I.V.:1265; IV Piggyback:110] Out: 1500 [Urine:1500] I/O this shift: In: 220 [P.O.:220] Out: -    Physical Exam: General: Alert, awake, oriented x3, in no acute distress. HEENT: No bruits, no goiter. Heart: Regular rate and rhythm, without murmurs, rubs, gallops. Lungs: Clear to auscultation bilaterally. Abdomen: Soft, nontender, nondistended, positive bowel sounds. Extremities: No clubbing cyanosis or edema with positive pedal pulses. Neuro: Grossly intact, nonfocal.    Lab Results:  Basename 11/18/10 0441 11/17/10 0522  WBC 6.9 5.8  HGB 8.2* 8.9*  HCT 24.8* 27.1*  PLT 104* 101*    Basename 11/18/10 0441 11/16/10 0442  NA 135 132*  K 4.2 4.0  CL 102 102  CO2 25 21  GLUCOSE 92 102*  BUN 4* 11  CREATININE 0.68 0.63  CALCIUM 8.8 8.7    Studies/Results: No results found.  Medications: Scheduled Meds:   . pantoprazole (PROTONIX) IV  40 mg Intravenous Q12H  . sertraline  50 mg Oral Daily  . sodium chloride      . sodium chloride      . sodium chloride      . sodium chloride      . sucralfate  1 g Oral TID WC & HS  . DISCONTD: fluconazole (DIFLUCAN) IV  200 mg Intravenous Q24H   Continuous Infusions:   . 0.9 % NaCl with KCl 20 mEq / L 50 mL/hr at 11/18/10 0933   PRN Meds:.acetaminophen, acetaminophen, antiseptic oral rinse, bisacodyl, HYDROmorphone (DILAUDID) injection, LORazepam, metoCLOPramide (REGLAN) injection, ondansetron, promethazine, sodium phosphate,  traZODone  Assessment/Plan:  1.  Adenocarcinoma of colon with mucinous features: On FOLFOX chemotherapy regimen. As per oncology. 2.  Nausea with vomiting: Presumed related to chemotherapy. No frank emesis however continues to be nauseous. Is on good doses of anti-emetic medication. I will discontinue Diflucan as there is no evidence of thrush on physical exam or on recent EGD. 3.  Esophageal reflux disease: Continue proton pump inhibitor and sucralfate. 4.  Neutropenia: Resolved.    LOS: 3 days   HERNANDEZ ACOSTA,ESTELA 11/18/2010, 11:50 AM

## 2010-11-18 NOTE — Progress Notes (Signed)
Subjective:  The patient reports that he has been handling chemotherapy well in regards to nausea and vomiting.  He explains that the anti-nausea medicine he was prescribed for home worked well.  Unfortunately, this cycle of chemotherapy caused nausea and vomiting that did not respond to antiemetics at home.  He illustrates that he has delayed nausea and vomiting which occur 4-6 days following chemotherapy administration.    The patient denies any complaints this morning.  He reports that his bowels are moving appropriately.  He denies any fevers, chills, night sweats.  He denies any urinary difficulties.  He denies a cough, SOB, or chest pain.  Objective: Vital signs in last 24 hours: Temp:  [97.7 F (36.5 C)-98.7 F (37.1 C)] 98.7 F (37.1 C) (08/29 9562) Pulse Rate:  [80-83] 83  (08/29 0613) Resp:  [20] 20  (08/29 0613) BP: (121-129)/(73-86) 124/79 mmHg (08/29 0613) SpO2:  [96 %-100 %] 96 % (08/29 1308)  Intake/Output from previous day: 08/28 0800 - 08/29 0759 In: 2095 [P.O.:720; I.V.:1265; IV Piggyback:110] Out: 1500 [Urine:1500] Intake/Output this shift:    General appearance: alert, cooperative, cachectic and no distress Resp: clear to auscultation bilaterally Cardio: regular rate and rhythm, S1, S2 normal, no murmur, click, rub or gallop GI: soft, non-tender; bowel sounds normal; no masses,  no organomegaly Extremities: extremities normal, atraumatic, no cyanosis or edema and pneumatic compression stockings in place. Port-a-cath: No signs of infection.  No erythema, heat, or pain.  Lab Results:  CBC    Component Value Date/Time   WBC 6.9 11/18/2010 0441   RBC 3.04* 11/18/2010 0441   HGB 8.2* 11/18/2010 0441   HCT 24.8* 11/18/2010 0441   PLT 104* 11/18/2010 0441   MCV 81.6 11/18/2010 0441   MCH 27.0 11/18/2010 0441   MCHC 33.1 11/18/2010 0441   RDW 17.9* 11/18/2010 0441   LYMPHSABS 1.3 11/16/2010 0511   MONOABS 0.3 11/16/2010 0511   EOSABS 0.0 11/16/2010 0511   BASOSABS 0.0  11/16/2010 0511     BMET  Basename 11/18/10 0441 11/16/10 0442  NA 135 132*  K 4.2 4.0  CL 102 102  CO2 25 21  GLUCOSE 92 102*  BUN 4* 11  CREATININE 0.68 0.63  CALCIUM 8.8 8.7    Studies/Results: No results found.  Medications: I have reviewed the patient's current medications.  Assessment/Plan: 1. Stage IIIB Colon cancer, presently undergoing FOLFOX chemotherapy. He recently completed cycle 4 on 11/09/10.  He is Day 10 of cycle 4.  Will hold chemotherapy at this point until the patient recovers.  Will consider a CSF following chemotherapy to prevent neutropenia.  Will also consider alterations to antiemetic regimen to better control his nausea and vomiting. 2. Neutropenia, S/P Neupogen injections 3. Nausea, improving    LOS: 3 days    Kiannah Grunow 11/18/2010

## 2010-11-19 LAB — CBC
MCH: 27.3 pg (ref 26.0–34.0)
MCHC: 33.7 g/dL (ref 30.0–36.0)
Platelets: 107 10*3/uL — ABNORMAL LOW (ref 150–400)
RBC: 3.22 MIL/uL — ABNORMAL LOW (ref 4.22–5.81)
RDW: 17.9 % — ABNORMAL HIGH (ref 11.5–15.5)

## 2010-11-19 LAB — BASIC METABOLIC PANEL
Calcium: 9.1 mg/dL (ref 8.4–10.5)
GFR calc Af Amer: >60 mL/min (ref >60)
GFR calc non Af Amer: >60 mL/min (ref >60)
Sodium: 136 mEq/L (ref 135–145)

## 2010-11-19 MED ORDER — HEPARIN SOD (PORK) LOCK FLUSH 100 UNIT/ML IV SOLN
500.0000 [IU] | INTRAVENOUS | Status: AC | PRN
  Administered 2010-11-19: 500 [IU]
  Filled 2010-11-19: qty 5

## 2010-11-19 MED ORDER — ONDANSETRON HCL 4 MG PO TABS: 4.0000 mg | ORAL_TABLET | Freq: Three times a day (TID) | ORAL | Status: AC | PRN

## 2010-11-19 MED ORDER — SODIUM CHLORIDE 0.9 % IJ SOLN
INTRAMUSCULAR | Status: AC
  Filled 2010-11-19: qty 10

## 2010-11-19 NOTE — Progress Notes (Signed)
Patient discharged to waiting vehicle via wheelchair escorted by R. Fransisco Beau, Charity fundraiser.  Patient in stable condition.  Patient had already received discharge instructions by L. Schonewitz, Charity fundraiser and had verbalized understanding.  Patient denies any problems.

## 2010-11-19 NOTE — Progress Notes (Signed)
D/c instructions reviewed with patient. Verbalized understanding.  Pt calling patient at this time to pick up for ride home.  Schonewitz, Candelaria Stagers 11/19/2010  1900

## 2010-11-19 NOTE — Discharge Summary (Signed)
Physician Discharge Summary  [name] MRN: [id number] DOB/AGE: September 20, 1950 60 y.o.  Admit date: 11/15/2010 Discharge date: 11/19/2010  Primary Care Physician:  [name], MD, MD   Discharge Diagnoses:    Patient Active Problem List  Diagnoses  . Adenocarcinoma of colon with mucinous features  . Bleeding gastrointestinal  . Supratherapeutic INR  . Abdominal  pain, other specified site  . Nausea and vomiting in adult  . Anemia, chronic disease  . Fever chills  . S/P partial gastrectomy  . Personal history of PE (pulmonary embolism)  . Personal history of PE (pulmonary embolism)  . Personal history of PE (pulmonary embolism)  . Bronchitis, acute  . Erosive esophagitis  . Nausea with vomiting  . Esophageal reflux disease  . Neutropenia    Current Discharge Medication List    START taking these medications   Details  ondansetron (ZOFRAN) 4 MG tablet Take 1 tablet (4 mg total) by mouth every 8 (eight) hours as needed for nausea. Qty: 20 tablet, Refills: 0      CONTINUE these medications which have NOT CHANGED   Details  feeding supplement (ENSURE CLINICAL STRENGTH) LIQD Take 237 mLs by mouth 2 (two) times daily between meals.    folic acid (FOLVITE) 1 MG tablet Take 1 mg by mouth daily.      HYDROcodone-acetaminophen (NORCO) 5-325 MG per tablet Take 1 tablet by mouth every 6 (six) hours as needed for pain. Qty: 45 tablet, Refills: 0   Associated Diagnoses: Adenocarcinoma of colon    LORazepam (ATIVAN) 1 MG tablet Take 1 mg by mouth every 4 (four) hours as needed.     megestrol (MEGACE) 40 MG/ML suspension Take 200 mg by mouth 2 (two) times daily.      olmesartan (BENICAR) 5 MG tablet Take 5 mg by mouth daily.      omeprazole (PRILOSEC) 20 MG capsule Take 40 mg by mouth 2 (two) times daily.      prochlorperazine (COMPAZINE) 10 MG tablet Take 10 mg by mouth every 6 (six) hours as needed.      sertraline (ZOLOFT) 50 MG tablet Take 50 mg by  mouth daily.      thiamine 100 MG tablet Take 100 mg by mouth daily.      CYANOCOBALAMIN IJ Inject 1,000 mcg as directed every 30 (thirty) days.      Cyclobenzaprine HCl (FLEXERIL PO) Take 10 mg by mouth 3 (three) times daily as needed.           Disposition and Follow-up: [name] will be discharged home today in stable and improved condition. He endorses no further nausea and vomiting today and is more than ready to leave [place]. He will need to followup with both his primary care physician and with Dr. Demetrios Isaacs.  Consults:  hematology/oncology    Significant Diagnostic Studies:  Ct Angio Chest W/cm &/or Wo Cm  11/15/2010  *RADIOLOGY REPORT*  Clinical Data:  Chest pain, cough.  History of colon cancer.  CT ANGIOGRAPHY CHEST WITH CONTRAST  Technique:  Multidetector CT imaging of the chest was performed using the standard protocol during bolus administration of intravenous contrast.  Multiplanar CT image reconstructions including MIPs were obtained to evaluate the vascular anatomy.  Contrast:  100 ml Omnipaque 350 IV contrast  Comparison:  09/29/2010 chest radiograph, chest CT 08/28/2010  Findings:  Left sided Port-A-Cath in place, with tip in the mid SVC.  The study is of adequate technical quality for evaluation for pulmonary embolism up  to and including the 3rd order pulmonary arteries.  Apparent filling defect within the branch points of the left lower lobe main pulmonary artery seen on one view only is most compatible with artifact due to volume averaging at the pulmonary artery branch points.  No filling defect is seen to suggest acute pulmonary embolism up to and including the 3rd order pulmonary arteries.  Heart size is normal.  No pericardial or pleural effusion.  8 mm pretracheal lymph node is slightly smaller, measuring 0.6 cm image 34.  There is diffuse esophageal wall thickening with internal air fluid level.  Wall thickening measures approximately 5 mm image 46.  This is present to the  level of the gastroesophageal junction. This is new since the prior exam.  Lung fields demonstrate stable 2-3 mm right upper lobe pleural parenchymal nodular opacities, for example images 14, 15, and 17. Curvilinear right lower lobe scarring is stable.  2 mm left lower lobe pulmonary nodule image 56 is stable.  Central airways are patent.  No acute osseous abnormality.  Mild rightward curvature centered at T4 is stable.  Review of the MIP images confirms the above findings.  IMPRESSION: No acute cardiopulmonary process.  Specifically, no acute pulmonary embolism is identified up to and including the 3rd order pulmonary arteries.  Stable nonspecific 2-3 mm bilateral pulmonary parenchymal nodules. These are amenable to follow-up per the patient's overall staging treatment plan.  Diffuse esophageal wall thickening which is new since the prior study and may represent reflux esophagitis, other infection/inflammation such as candidiasis if the patient is immunosuppressed, or much less likely neoplastic involvement given the development since the recent previous exam.  Original Report Authenticated By: Harrel Lemon, M.D.   Dg Abd Acute W/chest  11/15/2010  *RADIOLOGY REPORT*  Clinical Data: Shortness of breath.  Abdominal pain.  Weakness. Previous colon cancer resection and chemotherapy.  ACUTE ABDOMEN SERIES (ABDOMEN 2 VIEW & CHEST 1 VIEW)  Comparison: 09/26/2010.  Findings: The heart remains normal in size and the lungs are clear and remain mildly hyperexpanded.  Stable left subclavian porta catheter.  Dilated loops of colon and small bowel with gas seen through to the rectum.  Multiple surgical clips and staples in the abdomen bilaterally.  No free peritoneal air.  Diffuse osteopenia.  IMPRESSION:  1.  Diffuse colonic and small probable ileus. 2.  Mild changes of COPD.  Original Report Authenticated By: Darrol Angel, M.D.    Brief H and P: For complete details please refer to admission H and P, but in  brief [name] is a 60 year old white male admitted for hospital August 26 with complaints of nausea and vomiting for at least 4 days. He had recently received his chemotherapy for colon cancer. He endorses that he usually has delayed nausea and vomiting after his chemotherapy sessions. He has medications to treat him at home, however at this time he was not able to keep any food down and decided to come here for further evaluation.    Hospital Course:   1.  Adenocarcinoma of colon with mucinous features: on FOLFOX chemotherapy as per oncology. 2.  Nausea with vomiting: Thought to be a response to chemotherapy. He had a recent EGD with findings of erosive gastritis and has been maintained on a proton pump inhibitor. He was also initially given fluconazole however his EGD did not have evidence of candidiasis and he has not had thrush so I have discontinued this at time of discharge. 3.  Esophageal reflux disease: Continue  PPI 4.  Neutropenia: Resolved, secondary to chemotherapy for colon cancer.   Time spent on Discharge: Greater than 30 minutes.  SignedChaya Jan 11/19/2010, 9:52 AM

## 2010-11-24 ENCOUNTER — Encounter (HOSPITAL_BASED_OUTPATIENT_CLINIC_OR_DEPARTMENT_OTHER): Payer: Federal, State, Local not specified - PPO

## 2010-11-24 ENCOUNTER — Other Ambulatory Visit (HOSPITAL_COMMUNITY): Payer: Self-pay | Admitting: Oncology

## 2010-11-24 ENCOUNTER — Encounter (HOSPITAL_COMMUNITY): Payer: Federal, State, Local not specified - PPO | Attending: Oncology

## 2010-11-24 VITALS — BP 124/83 | HR 87 | Temp 98.1°F | Ht 69.0 in | Wt 117.0 lb

## 2010-11-24 DIAGNOSIS — C189 Malignant neoplasm of colon, unspecified: Secondary | ICD-10-CM | POA: Insufficient documentation

## 2010-11-24 DIAGNOSIS — Z5111 Encounter for antineoplastic chemotherapy: Secondary | ICD-10-CM

## 2010-11-24 LAB — COMPREHENSIVE METABOLIC PANEL
ALT: <5 U/L (ref 0–53)
Albumin: 2.9 g/dL — ABNORMAL LOW (ref 3.5–5.2)
Calcium: 9.1 mg/dL (ref 8.4–10.5)
GFR calc Af Amer: >60 mL/min (ref >60)
Glucose, Bld: 91 mg/dL (ref 70–99)
Potassium: 3.6 mEq/L (ref 3.5–5.1)
Sodium: 135 mEq/L (ref 135–145)
Total Protein: 5.6 g/dL — ABNORMAL LOW (ref 6.0–8.3)

## 2010-11-24 LAB — DIFFERENTIAL
Eosinophils Relative: 0 % (ref 0–5)
Lymphocytes Relative: 40 % (ref 12–46)
Lymphs Abs: 3.3 10*3/uL (ref 0.7–4.0)
Monocytes Relative: 16 % — ABNORMAL HIGH (ref 3–12)

## 2010-11-24 LAB — CEA: CEA: 5.5 ng/mL — ABNORMAL HIGH (ref 0.0–5.0)

## 2010-11-24 LAB — CBC
Platelets: 180 10*3/uL (ref 150–400)
RBC: 3.67 MIL/uL — ABNORMAL LOW (ref 4.22–5.81)
WBC: 8.1 10*3/uL (ref 4.0–10.5)

## 2010-11-24 MED ORDER — ONDANSETRON 8 MG/50ML IVPB (CHCC): 24.0000 mg | Freq: Once | INTRAVENOUS | Status: DC

## 2010-11-24 MED ORDER — SODIUM CHLORIDE 0.9 % IV SOLN
3960.0000 mg | INTRAVENOUS | Status: DC
  Administered 2010-11-24: 3960 mg via INTRAVENOUS
  Filled 2010-11-24: qty 79

## 2010-11-24 MED ORDER — FLUOROURACIL CHEMO INJECTION 2.5 GM/50ML
660.0000 mg | Freq: Once | INTRAVENOUS | Status: AC
  Administered 2010-11-24: 650 mg via INTRAVENOUS
  Filled 2010-11-24: qty 13

## 2010-11-24 MED ORDER — SODIUM CHLORIDE 0.9 % IJ SOLN
10.0000 mL | INTRAMUSCULAR | Status: DC | PRN
  Filled 2010-11-24: qty 10

## 2010-11-24 MED ORDER — LORAZEPAM 2 MG/ML IJ SOLN
0.5000 mg | Freq: Once | INTRAMUSCULAR | Status: AC
  Administered 2010-11-24: 0.5 mg via INTRAVENOUS

## 2010-11-24 MED ORDER — DEXTROSE 5 % IV SOLN: INTRAVENOUS | Status: DC

## 2010-11-24 MED ORDER — SODIUM CHLORIDE 0.9 % IV SOLN
INTRAVENOUS | Status: DC
  Administered 2010-11-24: 11:00:00 via INTRAVENOUS

## 2010-11-24 MED ORDER — SODIUM CHLORIDE 0.9 % IV SOLN
Freq: Once | INTRAVENOUS | Status: AC
  Administered 2010-11-24: 24 mg via INTRAVENOUS
  Filled 2010-11-24: qty 12

## 2010-11-24 MED ORDER — LEUCOVORIN CALCIUM INJECTION 100 MG
34.0000 mg | Freq: Once | INTRAMUSCULAR | Status: AC
  Administered 2010-11-24: 34 mg via INTRAVENOUS
  Filled 2010-11-24: qty 1.7

## 2010-11-24 MED ORDER — HEPARIN SOD (PORK) LOCK FLUSH 100 UNIT/ML IV SOLN
500.0000 [IU] | Freq: Once | INTRAVENOUS | Status: DC | PRN
  Filled 2010-11-24: qty 5

## 2010-11-24 MED ORDER — DEXTROSE 5 % IV BOLUS
50.0000 mL | INTRAVENOUS | Status: AC
  Administered 2010-11-24: 50 mL via INTRAVENOUS
  Filled 2010-11-24 (×2): qty 50

## 2010-11-24 MED ORDER — DEXAMETHASONE SODIUM PHOSPHATE 10 MG/ML IJ SOLN: 12.0000 mg | Freq: Once | INTRAMUSCULAR | Status: DC

## 2010-11-24 MED ORDER — DEXTROSE 5 % IV SOLN
33.0000 mg | Freq: Once | INTRAVENOUS | Status: DC
  Filled 2010-11-24: qty 1.7

## 2010-11-24 MED ORDER — LORAZEPAM 2 MG/ML IJ SOLN
INTRAMUSCULAR | Status: AC
  Administered 2010-11-24: 0.5 mg via INTRAVENOUS
  Filled 2010-11-24: qty 1

## 2010-11-24 MED ORDER — OXALIPLATIN CHEMO INJECTION 100 MG/20ML
85.0000 mg/m2 | Freq: Once | INTRAVENOUS | Status: AC
  Administered 2010-11-24: 145 mg via INTRAVENOUS
  Filled 2010-11-24: qty 29

## 2010-11-24 NOTE — Progress Notes (Signed)
Labs drawn today for cbc/diff,cmp,cea 

## 2010-11-25 ENCOUNTER — Encounter (HOSPITAL_COMMUNITY): Payer: Federal, State, Local not specified - PPO

## 2010-11-26 ENCOUNTER — Encounter (HOSPITAL_COMMUNITY): Payer: Federal, State, Local not specified - PPO

## 2010-11-26 ENCOUNTER — Ambulatory Visit (INDEPENDENT_AMBULATORY_CARE_PROVIDER_SITE_OTHER): Payer: Federal, State, Local not specified - PPO | Admitting: Gastroenterology

## 2010-11-26 ENCOUNTER — Encounter: Payer: Self-pay | Admitting: Gastroenterology

## 2010-11-26 ENCOUNTER — Encounter (HOSPITAL_BASED_OUTPATIENT_CLINIC_OR_DEPARTMENT_OTHER): Payer: Federal, State, Local not specified - PPO

## 2010-11-26 VITALS — BP 144/95 | HR 87 | Temp 97.6°F | Ht 69.0 in | Wt 119.6 lb

## 2010-11-26 DIAGNOSIS — K920 Hematemesis: Secondary | ICD-10-CM

## 2010-11-26 DIAGNOSIS — C189 Malignant neoplasm of colon, unspecified: Secondary | ICD-10-CM

## 2010-11-26 DIAGNOSIS — Z452 Encounter for adjustment and management of vascular access device: Secondary | ICD-10-CM

## 2010-11-26 MED ORDER — SODIUM CHLORIDE 0.9 % IJ SOLN
10.0000 mL | INTRAMUSCULAR | Status: DC | PRN
  Administered 2010-11-26: 10 mL via INTRAVENOUS
  Filled 2010-11-26: qty 10

## 2010-11-26 MED ORDER — HEPARIN SOD (PORK) LOCK FLUSH 100 UNIT/ML IV SOLN
INTRAVENOUS | Status: AC
  Filled 2010-11-26: qty 5

## 2010-11-26 MED ORDER — HEPARIN SOD (PORK) LOCK FLUSH 100 UNIT/ML IV SOLN
500.0000 [IU] | Freq: Once | INTRAVENOUS | Status: AC
  Administered 2010-11-26: 500 [IU] via INTRAVENOUS
  Filled 2010-11-26: qty 5

## 2010-11-26 MED ORDER — SODIUM CHLORIDE 0.9 % IJ SOLN
INTRAMUSCULAR | Status: AC
  Filled 2010-11-26: qty 10

## 2010-11-26 NOTE — Patient Instructions (Signed)
We have set you up for an endoscopy with Dr. Jena Gauss.  Seek medical attention immediately if you are vomiting bright red blood or have nausea and vomiting that does not go away.  Stop Prilosec. Begin Dexilant 60 mg daily each morning.

## 2010-11-26 NOTE — Progress Notes (Signed)
Referring Provider: [name], MD Primary Care Physician:  [name], MD, MD Primary Gastroenterologist: Dr. Jena Gauss   Chief Complaint  Patient presents with  . Abdominal Pain  . Emesis    HPI:   [name] is a 60 year old Caucasian male with history of colon cancer, s/p left hemicolectomy this year, seen by [place] inpatient at [facility] in July due to hematemesis. EGD at that time revealed erosive reflux esophagitis, Billroth I anatomy. He actually was readmitted in August for about 4 days to to nausea and vomiting following chemo. He reports coffee-ground emesis for the past 1-2 weeks. Notes black stools. Denies NSAIDs or aspirin powders. Denies bright red blood per rectum.  He denies any epigastric pain. Reports intermittent nausea and vomiting. Complains of dysphagia and odynophagia. Applesauce is the only food that settles well. He does have chronic left-sided abdominal pain.   He reports a cycle of chemo every other week. During his admission in August, diffuse esophageal wall thickening was seen on CT angiogram.     CT angiogram: 8/26: Diffuse esophageal wall thickening which is new since the prior  study and may represent reflux esophagitis, other  infection/inflammation such as candidiasis if the patient is  immunosuppressed, or much less likely neoplastic involvement given  the development since the recent previous exam.  Past Medical History  Diagnosis Date  . 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 Pulmonary embolism 02/2010  . Acid reflux   . Hypertension   . Osteoporosis   . Arthritis   . TIA (transient ischemic attack) 10/11  . ETOH abuse     quit 03/2010  . 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 Personal history of PE (pulmonary embolism) 10/01/2010  . Blood transfusion   . S/P endoscopy September 28, 2010    erosive reflux esophagitis, Billroth I anatomy    Past Surgical History  Procedure Date  . Abdominal surgery   . Hernia repair    right inguinal  . Appendectomy 1980s  . Cholecystectomy 1980s  . Colon surgery     left  . Portacath placement   . Abdominal sugery     for bowel obstruction x 8  . Colon surgery 07/31/10  . Esophagogastroduodenoscopy 09/28/2010    Procedure: ESOPHAGOGASTRODUODENOSCOPY (EGD);  Surgeon: Corbin Ade, MD;  Location: [facility];  Service: Endoscopy;  Laterality: N/A;    Current Outpatient Prescriptions  Medication Sig Dispense Refill  . CYANOCOBALAMIN IJ Inject 1,000 mcg as directed every 30 (thirty) days.        . Cyclobenzaprine HCl (FLEXERIL PO) Take 10 mg by mouth 3 (three) times daily as needed.        . feeding supplement (ENSURE CLINICAL STRENGTH) LIQD Take 237 mLs by mouth 2 (two) times daily between meals.      . folic acid (FOLVITE) 1 MG tablet Take 1 mg by mouth daily.        [name] LORazepam (ATIVAN) 1 MG tablet Take 1 mg by mouth every 4 (four) hours as needed.       . megestrol (MEGACE) 40 MG/ML suspension Take 200 mg by mouth 2 (two) times daily.        [name] olmesartan (BENICAR) 5 MG tablet Take 5 mg by mouth daily.        [name] omeprazole (PRILOSEC) 20 MG capsule Take 40 mg by mouth 2 (two) times daily.        . prochlorperazine (COMPAZINE) 10 MG tablet Take  10 mg by mouth every 6 (six) hours as needed.        . sertraline (ZOLOFT) 50 MG tablet Take 50 mg by mouth daily.        [name] thiamine 100 MG tablet Take 100 mg by mouth daily.          Allergies as of 11/26/2010  . (No Known Allergies)    Family History  Problem Relation Age of Onset  . Hypertension Mother   . Hypertension Father   . Colon cancer Neg Hx     History   Social History  . Marital Status: Married    Spouse Name: N/A    Number of Children: 3  . Years of Education: N/A   Occupational History  .  [place] [employer]   Social History Main Topics  . Smoking status: Current Everyday Smoker -- 0.5 packs/day for 40 years    Types: Cigarettes  . Smokeless tobacco: None  . Alcohol Use: No     hx heavy use x  12  . Drug Use: No  . Sexually Active: Yes   Other Topics Concern  . None   Social History Narrative  . None    Review of Systems: Gen: Denies fever, chills, + anorexia. Denies fatigue, weakness. +weight loss.  CV: Denies chest pain, palpitations, syncope, peripheral edema, and claudication. Resp: Denies dyspnea at rest, cough, wheezing, coughing up blood, and pleurisy. GI: Denies, jaundice, and fecal incontinence.  + dysphagia and odynophagia. Derm: Denies rash, itching, dry skin Psych: Denies depression, anxiety, memory loss, confusion. No homicidal or suicidal ideation.  Heme: Denies bruising, bleeding, and enlarged lymph nodes.  Physical Exam: BP 144/95  Pulse 87  Temp(Src) 97.6 F (36.4 C) (Temporal)  Ht 5\' 9"  (1.753 m)  Wt 119 lb 9.6 oz (54.25 kg)  BMI 17.66 kg/m2 General:   Alert and oriented. No distress noted. Pleasant and cooperative.  Head:  Normocephalic and atraumatic. Eyes:  Conjuctiva clear without scleral icterus. Mouth:  Oral mucosa pink and moist. Good dentition. No lesions. Neck:  Supple, without mass or thyromegaly. Heart:  S1, S2 present without murmurs, rubs, or gallops. Regular rate and rhythm. Abdomen:  +BS, soft, non-tender and non-distended. No rebound or guarding. No HSM or masses noted. Midline incision well-healed.  Msk:  Symmetrical without gross deformities. Normal posture. Extremities:  Without edema. Neurologic:  Alert and  oriented x4;  grossly normal neurologically. Skin:  Intact without significant lesions or rashes. Cervical Nodes:  No significant cervical adenopathy. Psych:  Alert and cooperative. Normal mood and affect.

## 2010-11-26 NOTE — Progress Notes (Signed)
Denies complaints associated with chemotherapy.

## 2010-11-26 NOTE — Progress Notes (Deleted)
CT angiogram: 8/26: Diffuse esophageal wall thickening which is new since the prior  study and may represent reflux esophagitis, other  infection/inflammation such as candidiasis if the patient is  immunosuppressed, or much less likely neoplastic involvement given  the development since the recent previous exam.

## 2010-11-29 ENCOUNTER — Encounter: Payer: Self-pay | Admitting: Gastroenterology

## 2010-11-29 DIAGNOSIS — K920 Hematemesis: Secondary | ICD-10-CM | POA: Insufficient documentation

## 2010-11-29 NOTE — Assessment & Plan Note (Signed)
60 year old male with history of colon cancer, s/p hemicolectomy in May this year. Underwent EGD July 9th with Dr. Jena Gauss, findings of erosive reflux esophagitis and Billroth I anatomy. Reports several week history of coffee-ground emesis and dark black stools. Intermittent nausea and vomiting, with dysphagia and odynophagia. CT angiogram performed August 26 with diffuse esophageal wall thickening. Anemia likely multifactorial, with last Hgb 9.9. Will obtain ifobt, set up for EGD as soon as possible with Dr. Jena Gauss for reevaluation of upper GI tract. Will switch from Prilosec to Dexilant as well.  Proceed with upper endoscopy, possible dilation if needed,  in the near future with Dr. Jena Gauss. The risks, benefits, and alternatives have been discussed in detail with patient. They have stated understanding and desire to proceed. The patient was last done with standard sedation. No need for Propofol at this time.  Switch to Dexilant  To ED if persistent nausea and vomiting, worsening abdominal pain, bloody emesis.

## 2010-12-01 ENCOUNTER — Encounter (HOSPITAL_COMMUNITY): Payer: Self-pay | Admitting: *Deleted

## 2010-12-01 ENCOUNTER — Encounter (HOSPITAL_COMMUNITY)
Admission: RE | Disposition: A | Discharge status: Home / Self Care | Payer: Self-pay | Source: Ambulatory Visit | Attending: Internal Medicine

## 2010-12-01 ENCOUNTER — Emergency Department (HOSPITAL_COMMUNITY)
Admission: EM | Admit: 2010-12-01 | Discharge: 2010-12-01 | Disposition: A | Discharge status: Home / Self Care | Payer: Federal, State, Local not specified - PPO | Attending: Emergency Medicine | Admitting: Emergency Medicine

## 2010-12-01 ENCOUNTER — Other Ambulatory Visit: Payer: Self-pay | Admitting: Internal Medicine

## 2010-12-01 ENCOUNTER — Encounter (HOSPITAL_COMMUNITY): Payer: Self-pay | Admitting: Emergency Medicine

## 2010-12-01 ENCOUNTER — Emergency Department (HOSPITAL_COMMUNITY): Payer: Federal, State, Local not specified - PPO

## 2010-12-01 ENCOUNTER — Ambulatory Visit (HOSPITAL_COMMUNITY)
Admission: RE | Admit: 2010-12-01 | Discharge: 2010-12-01 | Disposition: A | Discharge status: Home / Self Care | Payer: Federal, State, Local not specified - PPO | Source: Ambulatory Visit | Attending: Internal Medicine | Admitting: Internal Medicine

## 2010-12-01 DIAGNOSIS — Z79899 Other long term (current) drug therapy: Secondary | ICD-10-CM | POA: Insufficient documentation

## 2010-12-01 DIAGNOSIS — Z86718 Personal history of other venous thrombosis and embolism: Secondary | ICD-10-CM | POA: Insufficient documentation

## 2010-12-01 DIAGNOSIS — C772 Secondary and unspecified malignant neoplasm of intra-abdominal lymph nodes: Secondary | ICD-10-CM | POA: Insufficient documentation

## 2010-12-01 DIAGNOSIS — Z9221 Personal history of antineoplastic chemotherapy: Secondary | ICD-10-CM | POA: Insufficient documentation

## 2010-12-01 DIAGNOSIS — Z85038 Personal history of other malignant neoplasm of large intestine: Secondary | ICD-10-CM | POA: Insufficient documentation

## 2010-12-01 DIAGNOSIS — F17200 Nicotine dependence, unspecified, uncomplicated: Secondary | ICD-10-CM | POA: Insufficient documentation

## 2010-12-01 DIAGNOSIS — R1310 Dysphagia, unspecified: Secondary | ICD-10-CM

## 2010-12-01 DIAGNOSIS — K920 Hematemesis: Secondary | ICD-10-CM | POA: Insufficient documentation

## 2010-12-01 DIAGNOSIS — R112 Nausea with vomiting, unspecified: Secondary | ICD-10-CM | POA: Insufficient documentation

## 2010-12-01 DIAGNOSIS — I10 Essential (primary) hypertension: Secondary | ICD-10-CM | POA: Insufficient documentation

## 2010-12-01 DIAGNOSIS — R109 Unspecified abdominal pain: Secondary | ICD-10-CM | POA: Insufficient documentation

## 2010-12-01 DIAGNOSIS — K2100 Gastro-esophageal reflux disease with esophagitis, without bleeding: Secondary | ICD-10-CM

## 2010-12-01 DIAGNOSIS — K222 Esophageal obstruction: Secondary | ICD-10-CM | POA: Insufficient documentation

## 2010-12-01 DIAGNOSIS — C189 Malignant neoplasm of colon, unspecified: Secondary | ICD-10-CM | POA: Insufficient documentation

## 2010-12-01 DIAGNOSIS — K219 Gastro-esophageal reflux disease without esophagitis: Secondary | ICD-10-CM | POA: Insufficient documentation

## 2010-12-01 HISTORY — PX: ESOPHAGOGASTRODUODENOSCOPY: SHX5428

## 2010-12-01 LAB — COMPREHENSIVE METABOLIC PANEL
AST: 11 U/L (ref 0–37)
Albumin: 3.3 g/dL — ABNORMAL LOW (ref 3.5–5.2)
Alkaline Phosphatase: 83 U/L (ref 39–117)
BUN: 11 mg/dL (ref 6–23)
CO2: 21 mEq/L (ref 19–32)
Chloride: 100 mEq/L (ref 96–112)
GFR calc non Af Amer: >60 mL/min (ref >60)
Potassium: 4.2 mEq/L (ref 3.5–5.1)
Total Bilirubin: 0.7 mg/dL (ref 0.3–1.2)

## 2010-12-01 LAB — CBC
HCT: 27.5 % — ABNORMAL LOW (ref 39.0–52.0)
Hemoglobin: 9.5 g/dL — ABNORMAL LOW (ref 13.0–17.0)
MCH: 27.1 pg (ref 26.0–34.0)
MCHC: 34.5 g/dL (ref 30.0–36.0)
MCV: 78.6 fL (ref 78.0–100.0)
Platelets: 109 10*3/uL — ABNORMAL LOW (ref 150–400)
RBC: 3.5 MIL/uL — ABNORMAL LOW (ref 4.22–5.81)
RDW: 18.7 % — ABNORMAL HIGH (ref 11.5–15.5)
WBC: 4.1 10*3/uL (ref 4.0–10.5)

## 2010-12-01 LAB — URINE MICROSCOPIC-ADD ON

## 2010-12-01 LAB — URINALYSIS, ROUTINE W REFLEX MICROSCOPIC
Bilirubin Urine: NEGATIVE
Glucose, UA: NEGATIVE mg/dL
Ketones, ur: NEGATIVE mg/dL
Leukocytes, UA: NEGATIVE
Nitrite: NEGATIVE
Protein, ur: NEGATIVE mg/dL
Specific Gravity, Urine: >1.03 — ABNORMAL HIGH (ref 1.005–1.030)
Urobilinogen, UA: 0.2 mg/dL (ref 0.0–1.0)
pH: 5.5 (ref 5.0–8.0)

## 2010-12-01 LAB — DIFFERENTIAL
Basophils Absolute: 0 10*3/uL (ref 0.0–0.1)
Basophils Relative: 1 % (ref 0–1)
Lymphocytes Relative: 58 % — ABNORMAL HIGH (ref 12–46)
Monocytes Absolute: 0.1 10*3/uL (ref 0.1–1.0)
Monocytes Relative: 2 % — ABNORMAL LOW (ref 3–12)
Neutro Abs: 1.6 10*3/uL — ABNORMAL LOW (ref 1.7–7.7)
Neutrophils Relative %: 40 % — ABNORMAL LOW (ref 43–77)

## 2010-12-01 SURGERY — EGD (ESOPHAGOGASTRODUODENOSCOPY)
Anesthesia: Moderate Sedation

## 2010-12-01 MED ORDER — ONDANSETRON HCL 4 MG/2ML IJ SOLN
4.0000 mg | Freq: Once | INTRAMUSCULAR | Status: AC
  Administered 2010-12-01: 4 mg via INTRAVENOUS
  Filled 2010-12-01: qty 2

## 2010-12-01 MED ORDER — SODIUM CHLORIDE 0.45 % IV SOLN
Freq: Once | INTRAVENOUS | Status: AC
  Administered 2010-12-01: 14:00:00 via INTRAVENOUS

## 2010-12-01 MED ORDER — HYDROMORPHONE HCL 1 MG/ML IJ SOLN
1.0000 mg | Freq: Once | INTRAMUSCULAR | Status: AC
  Administered 2010-12-01: 1 mg via INTRAVENOUS
  Filled 2010-12-01: qty 1

## 2010-12-01 MED ORDER — BUTAMBEN-TETRACAINE-BENZOCAINE 2-2-14 % EX AERO
INHALATION_SPRAY | CUTANEOUS | Status: DC | PRN
  Administered 2010-12-01: 2 via TOPICAL

## 2010-12-01 MED ORDER — MEPERIDINE HCL 100 MG/ML IJ SOLN
INTRAMUSCULAR | Status: DC | PRN
  Administered 2010-12-01: 25 mg via INTRAVENOUS
  Administered 2010-12-01: 50 mg via INTRAVENOUS
  Administered 2010-12-01: 25 mg via INTRAVENOUS

## 2010-12-01 MED ORDER — SODIUM CHLORIDE 0.9 % IV SOLN
INTRAVENOUS | Status: DC
  Administered 2010-12-01 (×2): via INTRAVENOUS

## 2010-12-01 MED ORDER — STERILE WATER FOR IRRIGATION IR SOLN
Status: DC | PRN
  Administered 2010-12-01: 15:00:00

## 2010-12-01 MED ORDER — SODIUM CHLORIDE 0.9 % IV BOLUS (SEPSIS)
500.0000 mL | Freq: Once | INTRAVENOUS | Status: AC
  Administered 2010-12-01: 500 mL via INTRAVENOUS

## 2010-12-01 MED ORDER — HEPARIN SOD (PORK) LOCK FLUSH 100 UNIT/ML IV SOLN
INTRAVENOUS | Status: AC
  Administered 2010-12-01: 500 [IU]
  Filled 2010-12-01: qty 5

## 2010-12-01 MED ORDER — IOHEXOL 300 MG/ML  SOLN
100.0000 mL | Freq: Once | INTRAMUSCULAR | Status: AC | PRN
  Administered 2010-12-01: 100 mL via INTRAVENOUS

## 2010-12-01 MED ORDER — SUCRALFATE 1 GM/10ML PO SUSP: 1.0000 g | Freq: Four times a day (QID) | ORAL | Status: DC

## 2010-12-01 MED ORDER — MEPERIDINE HCL 100 MG/ML IJ SOLN
INTRAMUSCULAR | Status: AC
  Filled 2010-12-01: qty 1

## 2010-12-01 MED ORDER — SODIUM CHLORIDE 0.9 % IV BOLUS (SEPSIS)
500.0000 mL | Freq: Once | INTRAVENOUS | Status: AC
  Administered 2010-12-01: 1000 mL via INTRAVENOUS

## 2010-12-01 MED ORDER — MIDAZOLAM HCL 5 MG/5ML IJ SOLN
INTRAMUSCULAR | Status: AC
  Filled 2010-12-01: qty 5

## 2010-12-01 MED ORDER — MIDAZOLAM HCL 5 MG/5ML IJ SOLN
INTRAMUSCULAR | Status: DC | PRN
  Administered 2010-12-01 (×2): 2 mg via INTRAVENOUS
  Administered 2010-12-01: 1 mg via INTRAVENOUS

## 2010-12-01 NOTE — H&P (Signed)
  I have seen the patient prior to the procedure(s) today and reviewed the history and physical / consultation from 11/26/10.  There have been no changes. After consideration of the risks, benefits, alternatives and imponderables, the patient has consented to the procedure(s).

## 2010-12-01 NOTE — ED Notes (Signed)
Pt states that the pain has returned, pain is rated as a 7 on pain scale, nausea is better, EDP notified,

## 2010-12-01 NOTE — ED Notes (Signed)
MD at bedside. 

## 2010-12-01 NOTE — Consult Note (Signed)
PCP: [name], MD  Referring Physician: Donnetta Hutching, MD  Reason for Consult: Abdominal Pain  Assessment/Plan: Dehydration Mild hyponatremia  Non-acute abdomen; S/P EGD at 3 PM this afternoon, presented to the ED at 4:37 PM.  Known esophagitis Probable Gastroparesis Past history of GI bleed History of recent aColon s/p hemicolectomy and chemotherapy  Since there is no indication for admission to an acute bed, will hydrate patient in ED and discharge home to follow up with Dr. Jena Gauss tomorrow. Patient has been advised to return to the ED as needed or if he feels worse. Diet as tolerated.   HPI: [name] is an 60 y.o. male. With multiple medical problems including history of Ca Colon, GI bleed on coumadin, and reflux esophagitis, was seen by Dr. Luvenia Starch office Sept 9, for coffee ground emesis and had EGD done this afternoon. Wife reports he was told he may have slow gastric emptying and may need another test for that.   Patient was discharge home from endoscopy suite this afternoon, he fasted for EGD, and has not had anything to eat since then. Has had no vomiting, no diarrhea, but continues to have abdominal discomfort. Because of ongoing discomfort patient was brought to the emergency room for evaluation. Emergency room physician did not have the information that patient had EGD this afternoon, CT of the abdomen and pelvis was performed and the hospitalist was called for evaluation for abdominal pain.   Patient's condition has not deteriorated since the EGD this afternoon.    Past Medical History  Diagnosis Date  . 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 Pulmonary embolism 02/2010  . Acid reflux   . Hypertension   . Osteoporosis   . Arthritis   . TIA (transient ischemic attack) 10/11  . ETOH abuse     quit 03/2010  . 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 Personal history of PE (pulmonary embolism) 10/01/2010  . Blood transfusion   . S/P  endoscopy September 28, 2010    erosive reflux esophagitis, Billroth I anatomy    Past Surgical History  Procedure Date  . Hernia repair     right inguinal  . Appendectomy 1980s  . Cholecystectomy 1980s  . Colon surgery May 2012    left  . Portacath placement   . Abdominal sugery     for bowel obstruction x 8  . Esophagogastroduodenoscopy 09/28/2010    Procedure: ESOPHAGOGASTRODUODENOSCOPY (EGD);  Surgeon: Corbin Ade, MD;  Location: [facility];  Service: Endoscopy;  Laterality: N/A;    Medications: reviewed. Patient has not yet filled the prescription for Carafate.  Allergies: No Known Allergies  Social History:  reports that he has been smoking Cigarettes.  He has a 20 pack-year smoking history. He does not have any smokeless tobacco history on file. He reports that he does not drink alcohol or use illicit drugs.  Family History  Problem Relation Age of Onset  . Hypertension Mother   . Hypertension Father   . Colon cancer Neg Hx      Physical Exam: Blood pressure 126/87, pulse 77, temperature 98.3 F (36.8 C), temperature source Oral, resp. rate 18, height 5\' 9"  (1.753 m), weight 54.432 kg (120 lb), SpO2 100.00%.  Looks older that stated age, dehydrated but comfortable, not distressed. Abdomen, scaphoid, no rebound.    Results for orders placed during the hospital encounter of 12/01/10 (from the past 48 hour(s))  URINALYSIS, ROUTINE W REFLEX MICROSCOPIC  Status: Abnormal   Collection Time   12/01/10  5:06 PM      Component Value Range Comment   Color, Urine YELLOW  YELLOW     Appearance CLEAR  CLEAR     Specific Gravity, Urine >1.030 (*) 1.005 - 1.030     pH 5.5  5.0 - 8.0     Glucose, UA NEGATIVE  NEGATIVE (mg/dL)    Hgb urine dipstick SMALL (*) NEGATIVE     Bilirubin Urine NEGATIVE  NEGATIVE     Ketones, ur NEGATIVE  NEGATIVE (mg/dL)    Protein, ur NEGATIVE  NEGATIVE (mg/dL)    Urobilinogen, UA 0.2  0.0 - 1.0 (mg/dL)    Nitrite NEGATIVE  NEGATIVE      Leukocytes, UA NEGATIVE  NEGATIVE    URINE MICROSCOPIC-ADD ON     Status: Abnormal   Collection Time   12/01/10  5:06 PM      Component Value Range Comment   Squamous Epithelial / LPF RARE  RARE     WBC, UA 0-2  <3 (WBC/hpf)    RBC / HPF 21-50  <3 (RBC/hpf)    Bacteria, UA FEW (*) RARE    CBC     Status: Abnormal   Collection Time   12/01/10  5:46 PM      Component Value Range Comment   WBC 4.1  4.0 - 10.5 (K/uL)    RBC 3.50 (*) 4.22 - 5.81 (MIL/uL)    Hemoglobin 9.5 (*) 13.0 - 17.0 (g/dL)    HCT 16.1 (*) 09.6 - 52.0 (%)    MCV 78.6  78.0 - 100.0 (fL)    MCH 27.1  26.0 - 34.0 (pg)    MCHC 34.5  30.0 - 36.0 (g/dL)    RDW 04.5 (*) 40.9 - 15.5 (%)    Platelets 109 (*) 150 - 400 (K/uL)   DIFFERENTIAL     Status: Abnormal   Collection Time   12/01/10  5:46 PM      Component Value Range Comment   Neutrophils Relative 40 (*) 43 - 77 (%)    Neutro Abs 1.6 (*) 1.7 - 7.7 (K/uL)    Lymphocytes Relative 58 (*) 12 - 46 (%)    Lymphs Abs 2.4  0.7 - 4.0 (K/uL)    Monocytes Relative 2 (*) 3 - 12 (%)    Monocytes Absolute 0.1  0.1 - 1.0 (K/uL)    Eosinophils Relative 0  0 - 5 (%)    Eosinophils Absolute 0.0  0.0 - 0.7 (K/uL)    Basophils Relative 1  0 - 1 (%)    Basophils Absolute 0.0  0.0 - 0.1 (K/uL)   COMPREHENSIVE METABOLIC PANEL     Status: Abnormal   Collection Time   12/01/10  5:46 PM      Component Value Range Comment   Sodium 131 (*) 135 - 145 (mEq/L)    Potassium 4.2  3.5 - 5.1 (mEq/L)    Chloride 100  96 - 112 (mEq/L)    CO2 21  19 - 32 (mEq/L)    Glucose, Bld 98  70 - 99 (mg/dL)    BUN 11  6 - 23 (mg/dL)    Creatinine, Ser 8.11  0.50 - 1.35 (mg/dL)    Calcium 8.8  8.4 - 10.5 (mg/dL)    Total Protein 6.0  6.0 - 8.3 (g/dL)    Albumin 3.3 (*) 3.5 - 5.2 (g/dL)    AST 11  0 - 37 (U/L)  ALT 5  0 - 53 (U/L)    Alkaline Phosphatase 83  39 - 117 (U/L)    Total Bilirubin 0.7  0.3 - 1.2 (mg/dL)    GFR calc non Af Amer >60  >60 (mL/min)    GFR calc Af Amer >60  >60 (mL/min)     LIPASE, BLOOD     Status: Normal   Collection Time   12/01/10  5:46 PM      Component Value Range Comment   Lipase 29  11 - 59 (U/L)     Ct Abdomen Pelvis W Contrast  12/01/2010  *RADIOLOGY REPORT*  Clinical Data: Left lower abdominal pain  CT ABDOMEN AND PELVIS WITH CONTRAST  Technique:  Multidetector CT imaging of the abdomen and pelvis was performed following the standard protocol during bolus administration of intravenous contrast.  Contrast: OMNIPAQUE IOHEXOL 300 MG/ML IV SOLN  Comparison: 08/14/2010  Findings: Limited images through the lung bases demonstrate no significant appreciable abnormality. The heart size is within normal limits. No pleural or pericardial effusion.  Small hiatal hernia distal esophageal circumferential wall thickening.  Unremarkable liver, spleen, and adrenal glands.  There is mild prominence of the pancreatic duct to the level of the ampulla.  No obstructing lesion identified.  The common bile duct is also mildly prominent, measuring up to 8 millimeters in diameter.  Bilateral renal hypodensities are too small to further characterized with the exception of a 2.9 cm cyst arising anteriorly from the right kidney.  No hydronephrosis.  No hydroureter.  Moderate amount of air and stool within the colon. Colonic bowel anastomotic suture noted within the left hemiabdomen.  Surgical clips within the mesentery in the left mid abdomen.  No free or loculated fluid collections.  No free intraperitoneal air.  The stomach and proximal small bowel are distended.  The duodenal measures up to 5.7 cm.  Jejunal loops are decompressed, with proximal anastomotic suture noted. Loops of ileum contain fluid. No bowel wall thickening.  Normal caliber vasculature.  Thin-walled bladder, partially decompressed.  No free fluid within the pelvis.  Left femoral neck bone island.  Diffuse osteopenia.  No aggressive osseous lesion identified.  IMPRESSION: Postsurgical changes involving the small and  large bowel.  There is distension of the stomach and duodenum to the level of the small bowel anastamosis, suggesting delayed transit without complete obstruction. No free intraperitoneal air or fluid.  Mild distension of the main pancreatic duct and CBD, to the level of the ampulla.  This is increased in the interval.  Correlate with LFTs and ERCP or MRCP if clinically warranted.  Small hiatal hernia and circumferential distal esophageal wall thickening. This is a nonspecific finding however can be seen with gastroesophageal reflux disease.  Original Report Authenticated By: Waneta Martins, M.D.        Xylah Early 12/01/2010, 10:52 PM

## 2010-12-01 NOTE — ED Provider Notes (Signed)
History     CSN: [id number] Arrival date & time: 12/01/2010  4:37 PM  Chief Complaint  Patient presents with  . Abdominal Pain   HPI  abdominal pain for long period of time worse past 24 hours. Poor appetite. Nausea but no vomiting. No diarrhea she has had multiple abdominal surgeries in the past (C. PMH). Also feels cold. 30 pound weight loss since May.  Past Medical History  Diagnosis Date  . 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 Pulmonary embolism 02/2010  . Acid reflux   . Hypertension   . Osteoporosis   . Arthritis   . TIA (transient ischemic attack) 10/11  . ETOH abuse     quit 03/2010  . 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 Personal history of PE (pulmonary embolism) 10/01/2010  . Blood transfusion   . S/P endoscopy September 28, 2010    erosive reflux esophagitis, [facility] I anatomy    Past Surgical History  Procedure Date  . Hernia repair     right inguinal  . Appendectomy 1980s  . Cholecystectomy 1980s  . Colon surgery May 2012    left  . Portacath placement   . Abdominal sugery     for bowel obstruction x 8  . Esophagogastroduodenoscopy 09/28/2010    Procedure: ESOPHAGOGASTRODUODENOSCOPY (EGD);  Surgeon: Corbin Ade, MD;  Location: [facility];  Service: Endoscopy;  Laterality: N/A;    Family History  Problem Relation Age of Onset  . Hypertension Mother   . Hypertension Father   . Colon cancer Neg Hx     History  Substance Use Topics  . Smoking status: Current Everyday Smoker -- 0.5 packs/day for 40 years    Types: Cigarettes  . Smokeless tobacco: Not on file  . Alcohol Use: No     hx heavy use x 12      Review of Systems  All other systems reviewed and are negative.    Physical Exam  BP 163/108  Pulse 84  Temp(Src) 98 F (36.7 C) (Oral)  Resp 18  Ht 5\' 9"  (1.753 m)  Wt 120 lb (54.432 kg)  BMI 17.72 kg/m2  SpO2 100%  Physical Exam  Nursing note and vitals reviewed. Constitutional: He is oriented to person,  place, and time.       Patient is cachectic and looks weak  HENT:  Head: Normocephalic and atraumatic.  Eyes: Conjunctivae and EOM are normal. Pupils are equal, round, and reactive to light.  Neck: Normal range of motion. Neck supple.  Cardiovascular: Normal rate and regular rhythm.   Pulmonary/Chest: Effort normal and breath sounds normal.  Abdominal: Soft. Bowel sounds are normal.       Tender in left lower abdomen. Previous vertical abdominal incision  Musculoskeletal: Normal range of motion.  Neurological: He is alert and oriented to person, place, and time.  Skin: Skin is warm and dry.  Psychiatric: He has a normal mood and affect.    ED Course  Procedures  MDM Patient is at risk for bowel obstruction. I believe he has a history of colon cancer, CT abdomen pelvis, check labs, treat pain.      Donnetta Hutching, MD 12/01/10 [phone]

## 2010-12-01 NOTE — ED Notes (Signed)
Hospitalist here to evaluate pt for admission,.  

## 2010-12-01 NOTE — ED Notes (Signed)
Pt sleeping, family updated on delay,

## 2010-12-01 NOTE — ED Notes (Addendum)
Pt c/o abd pain with n today. Denies v/d. Pt states he had egd done today, but pain/n started before test.

## 2010-12-01 NOTE — ED Notes (Signed)
Pt states that the abd pain has gotten worse along with the nausea. EDP notified, additional orders given

## 2010-12-02 ENCOUNTER — Telehealth: Payer: Self-pay | Admitting: Gastroenterology

## 2010-12-02 NOTE — Telephone Encounter (Signed)
Pt completed EGD recently. He needs a colonoscopy around timeframe of November. (hx of colon cancer diagnosed May this year, s/p hemicolectomy).  Please make sure he has an appt for sometime in October. Thanks!

## 2010-12-04 ENCOUNTER — Emergency Department (HOSPITAL_COMMUNITY): Payer: Federal, State, Local not specified - PPO

## 2010-12-04 ENCOUNTER — Encounter (HOSPITAL_COMMUNITY): Payer: Self-pay | Admitting: *Deleted

## 2010-12-04 ENCOUNTER — Inpatient Hospital Stay (HOSPITAL_COMMUNITY)
Admission: EM | Admit: 2010-12-04 | Discharge: 2010-12-14 | DRG: 551 | Disposition: A | Discharge status: Home / Self Care | Payer: Federal, State, Local not specified - PPO | Attending: Family Medicine | Admitting: Family Medicine

## 2010-12-04 DIAGNOSIS — K208 Other esophagitis without bleeding: Principal | ICD-10-CM | POA: Diagnosis present

## 2010-12-04 DIAGNOSIS — K920 Hematemesis: Secondary | ICD-10-CM

## 2010-12-04 DIAGNOSIS — D509 Iron deficiency anemia, unspecified: Secondary | ICD-10-CM | POA: Diagnosis present

## 2010-12-04 DIAGNOSIS — T451X5A Adverse effect of antineoplastic and immunosuppressive drugs, initial encounter: Secondary | ICD-10-CM

## 2010-12-04 DIAGNOSIS — D61818 Other pancytopenia: Secondary | ICD-10-CM | POA: Diagnosis present

## 2010-12-04 DIAGNOSIS — F329 Major depressive disorder, single episode, unspecified: Secondary | ICD-10-CM | POA: Diagnosis present

## 2010-12-04 DIAGNOSIS — Z86718 Personal history of other venous thrombosis and embolism: Secondary | ICD-10-CM

## 2010-12-04 DIAGNOSIS — R112 Nausea with vomiting, unspecified: Secondary | ICD-10-CM

## 2010-12-04 DIAGNOSIS — J189 Pneumonia, unspecified organism: Secondary | ICD-10-CM

## 2010-12-04 DIAGNOSIS — E538 Deficiency of other specified B group vitamins: Secondary | ICD-10-CM | POA: Diagnosis present

## 2010-12-04 DIAGNOSIS — R52 Pain, unspecified: Secondary | ICD-10-CM | POA: Diagnosis present

## 2010-12-04 DIAGNOSIS — K2210 Ulcer of esophagus without bleeding: Secondary | ICD-10-CM

## 2010-12-04 DIAGNOSIS — D709 Neutropenia, unspecified: Secondary | ICD-10-CM | POA: Diagnosis present

## 2010-12-04 DIAGNOSIS — R791 Abnormal coagulation profile: Secondary | ICD-10-CM

## 2010-12-04 DIAGNOSIS — D638 Anemia in other chronic diseases classified elsewhere: Secondary | ICD-10-CM

## 2010-12-04 DIAGNOSIS — J209 Acute bronchitis, unspecified: Secondary | ICD-10-CM

## 2010-12-04 DIAGNOSIS — R5081 Fever presenting with conditions classified elsewhere: Secondary | ICD-10-CM | POA: Diagnosis present

## 2010-12-04 DIAGNOSIS — E871 Hypo-osmolality and hyponatremia: Secondary | ICD-10-CM | POA: Diagnosis present

## 2010-12-04 DIAGNOSIS — K219 Gastro-esophageal reflux disease without esophagitis: Secondary | ICD-10-CM

## 2010-12-04 DIAGNOSIS — K922 Gastrointestinal hemorrhage, unspecified: Secondary | ICD-10-CM

## 2010-12-04 DIAGNOSIS — D61810 Antineoplastic chemotherapy induced pancytopenia: Secondary | ICD-10-CM

## 2010-12-04 DIAGNOSIS — R109 Unspecified abdominal pain: Secondary | ICD-10-CM

## 2010-12-04 DIAGNOSIS — R509 Fever, unspecified: Secondary | ICD-10-CM

## 2010-12-04 DIAGNOSIS — F3289 Other specified depressive episodes: Secondary | ICD-10-CM | POA: Diagnosis present

## 2010-12-04 DIAGNOSIS — C189 Malignant neoplasm of colon, unspecified: Secondary | ICD-10-CM

## 2010-12-04 DIAGNOSIS — Z86711 Personal history of pulmonary embolism: Secondary | ICD-10-CM | POA: Diagnosis present

## 2010-12-04 DIAGNOSIS — R1033 Periumbilical pain: Secondary | ICD-10-CM | POA: Diagnosis present

## 2010-12-04 DIAGNOSIS — Z903 Acquired absence of stomach [part of]: Secondary | ICD-10-CM

## 2010-12-04 DIAGNOSIS — G8929 Other chronic pain: Secondary | ICD-10-CM

## 2010-12-04 LAB — DIFFERENTIAL
Basophils Absolute: 0 10*3/uL (ref 0.0–0.1)
Eosinophils Relative: 1 % (ref 0–5)
Lymphocytes Relative: 47 % — ABNORMAL HIGH (ref 12–46)
Lymphs Abs: 2.1 10*3/uL (ref 0.7–4.0)
Neutro Abs: 2.2 10*3/uL (ref 1.7–7.7)

## 2010-12-04 LAB — COMPREHENSIVE METABOLIC PANEL
Albumin: 3.3 g/dL — ABNORMAL LOW (ref 3.5–5.2)
Alkaline Phosphatase: 87 U/L (ref 39–117)
BUN: 5 mg/dL — ABNORMAL LOW (ref 6–23)
Creatinine, Ser: 0.5 mg/dL (ref 0.50–1.35)
GFR calc Af Amer: >60 mL/min (ref >60)
Glucose, Bld: 87 mg/dL (ref 70–99)
Total Protein: 5.9 g/dL — ABNORMAL LOW (ref 6.0–8.3)

## 2010-12-04 LAB — CBC
HCT: 24.5 % — ABNORMAL LOW (ref 39.0–52.0)
MCV: 77.3 fL — ABNORMAL LOW (ref 78.0–100.0)
Platelets: 67 10*3/uL — ABNORMAL LOW (ref 150–400)
RBC: 3.17 MIL/uL — ABNORMAL LOW (ref 4.22–5.81)
RDW: 18.5 % — ABNORMAL HIGH (ref 11.5–15.5)
WBC: 4.4 10*3/uL (ref 4.0–10.5)

## 2010-12-04 LAB — LIPASE, BLOOD: Lipase: 32 U/L (ref 11–59)

## 2010-12-04 MED ORDER — HYDROMORPHONE HCL 1 MG/ML IJ SOLN
INTRAMUSCULAR | Status: AC
  Administered 2010-12-04: 1 mg
  Filled 2010-12-04: qty 1

## 2010-12-04 MED ORDER — HYDROMORPHONE HCL 1 MG/ML IJ SOLN
1.0000 mg | Freq: Once | INTRAMUSCULAR | Status: AC
  Administered 2010-12-04: 1 mg via INTRAVENOUS
  Filled 2010-12-04: qty 1

## 2010-12-04 MED ORDER — SODIUM CHLORIDE 0.9 % IV SOLN: INTRAVENOUS | Status: DC

## 2010-12-04 MED ORDER — ONDANSETRON HCL 4 MG/2ML IJ SOLN
INTRAMUSCULAR | Status: AC
  Administered 2010-12-04: 4 mg
  Filled 2010-12-04: qty 2

## 2010-12-04 MED ORDER — PANTOPRAZOLE SODIUM 40 MG IV SOLR
40.0000 mg | Freq: Once | INTRAVENOUS | Status: AC
  Administered 2010-12-04: 40 mg via INTRAVENOUS
  Filled 2010-12-04: qty 40

## 2010-12-04 MED ORDER — SODIUM CHLORIDE 0.9 % IV BOLUS (SEPSIS): 500.0000 mL | Freq: Once | INTRAVENOUS | Status: DC

## 2010-12-04 NOTE — ED Notes (Signed)
Abdominal pain, nausea and vomiting

## 2010-12-04 NOTE — ED Provider Notes (Signed)
Scribed for Donnetta Hutching, MD, the patient was seen in room [facility] . This chart was scribed by Ellie Lunch. This patient's care was started at 10:23 PM.   CSN: [id number] Arrival date & time: 12/04/2010  7:56 PM   Chief Complaint  Patient presents with  . Abdominal Pain     (Include location/radiation/quality/duration/timing/severity/associated sxs/prior treatment) HPI [name] HENDERSHOTT is a 60 y.o. male who presents to the Emergency Department complaining of abdominal pain for the past several months becoming progressively worse the past 4 days. C/o associated n/v, hematemesis with red blood, poor appetite and chills. Pt reports 30 lb weight lost since May.  Pt was seen in ED 12/01/2010 for similar sx. Pt reports sx have become progressively worse since discharge. Pt has hx of multiple abdominal surgeries most recently lysis of adhesions and partial colectomy performed 07/31/2010 by Dr. Leticia Penna at [facility]. Level V caveat for urgent need for intervention  PCP Dr. Ignacia Palma in [place]  HPI ELEMENTS:  Location: abdomen  Onset: months ago, progressively worse past 4 days  Timing: constant  Severity: 8/10 Context: as above  Associated symptoms: n/v, hematemesis, poor appetite, feels cold    Past Medical History  Diagnosis Date  . 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 Pulmonary embolism 02/2010  . Acid reflux   . Hypertension   . Osteoporosis   . Arthritis   . TIA (transient ischemic attack) 10/11  . ETOH abuse     quit 03/2010  . 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 Personal history of PE (pulmonary embolism) 10/01/2010  . Blood transfusion   . S/P endoscopy September 28, 2010    erosive reflux esophagitis, [facility]roth I anatomy     Past Surgical History  Procedure Date  . Hernia repair     right inguinal  . Appendectomy 1980s  . Cholecystectomy 1980s  . Colon surgery May 2012    left  . Portacath placement   . Abdominal sugery     for bowel obstruction x  8  . Esophagogastroduodenoscopy 09/28/2010    Procedure: ESOPHAGOGASTRODUODENOSCOPY (EGD);  Surgeon: Corbin Ade, MD;  Location: [facility];  Service: Endoscopy;  Laterality: N/A;    Family History  Problem Relation Age of Onset  . Hypertension Mother   . Hypertension Father   . Colon cancer Neg Hx     History  Substance Use Topics  . Smoking status: Current Everyday Smoker -- 0.5 packs/day for 40 years    Types: Cigarettes  . Smokeless tobacco: Not on file  . 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  Allergies  Review of patient's allergies indicates no known allergies.  Home Medications   Current Outpatient Rx  Name Route Sig Dispense Refill  . CYANOCOBALAMIN IJ Injection Inject 1,000 mcg as directed every 30 (thirty) days.      . CYCLOBENZAPRINE HCL 10 MG PO TABS Oral Take 10 mg by mouth as needed. For muscle spasms    . ENSURE CLINICAL ST REVIGOR PO LIQD Oral Take 237 mLs by mouth 2 (two) times daily between meals.    [name] FOLIC ACID 1 MG PO TABS Oral Take 1 mg by mouth daily.      [name] HYDROCODONE-ACETAMINOPHEN 5-325 MG PO TABS Oral Take 1 tablet by mouth every 6 (six) hours as  needed. Prn pain     . LIDOCAINE-PRILOCAINE 2.5-2.5 % EX CREA Topical Apply 1 application topically as needed. For port access    . LORAZEPAM 1 MG PO TABS Oral Take 1 mg by mouth every 4 (four) hours as needed. Every 3-4 hours prn n/v    . MEGESTROL ACETATE 40 MG/ML PO SUSP Oral Take 200 mg by mouth 2 (two) times daily.      [name] OLMESARTAN MEDOXOMIL 5 MG PO TABS Oral Take 5 mg by mouth daily.      [name] OMEPRAZOLE 20 MG PO CPDR Oral Take 40 mg by mouth daily.     [name] ONDANSETRON HCL 4 MG PO TABS Oral Take 4 mg by mouth every 12 (twelve) hours as needed. For nausea    . PROCHLORPERAZINE MALEATE 10 MG PO TABS Oral Take 10 mg by mouth every 6 (six) hours as needed.      [name] PROCHLORPERAZINE 25 MG RE SUPP  Rectal Place 25 mg rectally every 12 (twelve) hours as needed. For nausea    . SERTRALINE HCL 50 MG PO TABS Oral Take 50 mg by mouth at bedtime.     . SUCRALFATE 1 GM/10ML PO SUSP Oral Take 10 mLs (1 g total) by mouth 4 (four) times daily. 420 mL 2  . THIAMINE HCL 100 MG PO TABS Oral Take 100 mg by mouth daily.      [name] FLEXERIL PO Oral Take 10 mg by mouth 3 (three) times daily as needed.        Physical Exam    BP 133/88  Pulse 77  Temp(Src) 98.2 F (36.8 C) (Oral)  Resp 18  Ht 5\' 9"  (1.753 m)  Wt 115 lb (52.164 kg)  BMI 16.98 kg/m2  SpO2 100%  Physical Exam  Nursing note and vitals reviewed. Constitutional: He is oriented to person, place, and time. He appears well-developed.       Appears cachectic and weak  HENT:  Head: Normocephalic and atraumatic.  Eyes: Conjunctivae and EOM are normal.  Neck: Normal range of motion. Neck supple.  Cardiovascular: Normal rate, regular rhythm and normal heart sounds.   Pulmonary/Chest: Effort normal and breath sounds normal.  Abdominal: Soft. There is tenderness.       Tender in lower left abdomen. Previous vertical abdominal incision.  Musculoskeletal: Normal range of motion.  Neurological: He is alert and oriented to person, place, and time.  Skin: Skin is warm and dry.  Psychiatric: He has a normal mood and affect. Judgment normal.   Procedures  OTHER DATA REVIEWED: Nursing notes, vital signs, and past medical records reviewed.  DIAGNOSTIC STUDIES: Oxygen Saturation is 100% on room air, normal by my interpretation.    LABS / RADIOLOGY:  Results for orders placed during the hospital encounter of 12/04/10  COMPREHENSIVE METABOLIC PANEL      Component Value Range   Sodium 131 (*) 135 - 145 (mEq/L)   Potassium 3.0 (*) 3.5 - 5.1 (mEq/L)   Chloride 99  96 - 112 (mEq/L)   CO2 20  19 - 32 (mEq/L)   Glucose, Bld 87  70 - 99 (mg/dL)   BUN 5 (*) 6 - 23 (mg/dL)   Creatinine, Ser 1.61  0.50 - 1.35 (mg/dL)   Calcium 8.6  8.4 - 09.6  (mg/dL)   Total Protein 5.9 (*) 6.0 - 8.3 (g/dL)   Albumin 3.3 (*) 3.5 - 5.2 (g/dL)   AST 9  0 - 37 (U/L)   ALT <5  0 -  53 (U/L)   Alkaline Phosphatase 87  39 - 117 (U/L)   Total Bilirubin 0.4  0.3 - 1.2 (mg/dL)   GFR calc non Af Amer >60  >60 (mL/min)   GFR calc Af Amer >60  >60 (mL/min)  CBC      Component Value Range   WBC 4.4  4.0 - 10.5 (K/uL)   RBC 3.17 (*) 4.22 - 5.81 (MIL/uL)   Hemoglobin 8.5 (*) 13.0 - 17.0 (g/dL)   HCT 04.5 (*) 40.9 - 52.0 (%)   MCV 77.3 (*) 78.0 - 100.0 (fL)   MCH 26.8  26.0 - 34.0 (pg)   MCHC 34.7  30.0 - 36.0 (g/dL)   RDW 81.1 (*) 91.4 - 15.5 (%)   Platelets 67 (*) 150 - 400 (K/uL)  DIFFERENTIAL      Component Value Range   Neutrophils Relative 49  43 - 77 (%)   Neutro Abs 2.2  1.7 - 7.7 (K/uL)   Lymphocytes Relative 47 (*) 12 - 46 (%)   Lymphs Abs 2.1  0.7 - 4.0 (K/uL)   Monocytes Relative 3  3 - 12 (%)   Monocytes Absolute 0.1  0.1 - 1.0 (K/uL)   Eosinophils Relative 1  0 - 5 (%)   Eosinophils Absolute 0.0  0.0 - 0.7 (K/uL)   Basophils Relative 0  0 - 1 (%)   Basophils Absolute 0.0  0.0 - 0.1 (K/uL)  LIPASE, BLOOD      Component Value Range   Lipase 32  11 - 59 (U/L)   Dg Abd Acute W/chest  12/04/2010  *RADIOLOGY REPORT*  Clinical Data: Abdominal pain, nausea, vomiting, history colon cancer with surgery chemotherapy, hypertension, appendectomy, cholecystectomy, smoking  ACUTE ABDOMEN SERIES (ABDOMEN 2 VIEW & CHEST 1 VIEW)  Comparison: 11/15/2010  Findings: Normal heart size, mediastinal contours, and pulmonary vascularity. Minimal elongation of thoracic aorta noted. Left subclavian power port, tip projecting over SVC. Emphysematous changes without infiltrate or effusion. No pneumothorax. Diffuse osseous demineralization.  Gaseous distention of the proximal colon to the level of the distal sigmoid colon. Small amount of rectal gas though decompressed. Findings could represent colonic ileus or distal colonic obstruction. Scattered surgical clips  throughout abdomen. Few dilated air filled loops of small bowel also identified. No free intraperitoneal air or definite urinary tract calcification. Bones appear demineralized.  IMPRESSION: Emphysematous changes. Colonic dilatation to the level of distal sigmoid colon, with a few associated mildly dilated air-filled small bowel loops. Findings could represent ileus or distal colonic obstruction.  Original Report Authenticated By: Lollie Marrow, M.D.   ED COURSE / COORDINATION OF CARE: 22:30-  Discussed with PT treatment plan for pain and likely admission. Orders Placed This Encounter  Procedures  . DG Abd Acute W/Chest  . Comprehensive metabolic panel  . CBC  . Differential  . Lipase, blood   Medications  sodium chloride 0.9 % bolus 500 mL   0.9 %  sodium chloride infusion   HYDROmorphone (DILAUDID) 1 MG/ML injection (1 mg  Given 12/04/10 2223)  ondansetron (ZOFRAN) 4 MG/2ML injection (4 mg  Given 12/04/10 2223)  pantoprazole (PROTONIX) injection 40 mg (40 mg Intravenous Given 12/04/10 2309)  HYDROmorphone (DILAUDID) injection 1 mg (1 mg Intravenous Given 12/04/10 2309)   MDM: Patient was here 3 nights ago with similar symptoms. I had consulted  internal medicine to admit. He was discharged home that night. He has similar symptoms tonight with abdominal pain and some hematemesis. For full previous abdominal surgeries. Three-way abdomen shows  ileus versus potential obstruction. Patient was scanned the other night. He clinically looks similar to me. Admit to general medicine  IMPRESSION: Diagnoses that have been ruled out:  Diagnoses that are still under consideration:  Final diagnoses:    SCRIBE ATTESTATION: I personally performed the services described in this documentation, which was scribed in my presence. The recorded information has been reviewed and considered. Donnetta Hutching, MD    Donnetta Hutching, MD 12/05/10 [phone]

## 2010-12-05 DIAGNOSIS — G8929 Other chronic pain: Secondary | ICD-10-CM | POA: Diagnosis present

## 2010-12-05 DIAGNOSIS — R109 Unspecified abdominal pain: Secondary | ICD-10-CM | POA: Diagnosis present

## 2010-12-05 DIAGNOSIS — T451X5A Adverse effect of antineoplastic and immunosuppressive drugs, initial encounter: Secondary | ICD-10-CM | POA: Diagnosis present

## 2010-12-05 DIAGNOSIS — D61810 Antineoplastic chemotherapy induced pancytopenia: Secondary | ICD-10-CM | POA: Diagnosis present

## 2010-12-05 DIAGNOSIS — K920 Hematemesis: Secondary | ICD-10-CM | POA: Diagnosis present

## 2010-12-05 LAB — CBC
HCT: 21.3 % — ABNORMAL LOW (ref 39.0–52.0)
Hemoglobin: 7.3 g/dL — ABNORMAL LOW (ref 13.0–17.0)
MCHC: 34.3 g/dL (ref 30.0–36.0)
MCV: 77.7 fL — ABNORMAL LOW (ref 78.0–100.0)
RDW: 18.6 % — ABNORMAL HIGH (ref 11.5–15.5)

## 2010-12-05 LAB — BASIC METABOLIC PANEL
BUN: 5 mg/dL — ABNORMAL LOW (ref 6–23)
Creatinine, Ser: 0.56 mg/dL (ref 0.50–1.35)
GFR calc Af Amer: >60 mL/min (ref >60)
GFR calc non Af Amer: >60 mL/min (ref >60)
Glucose, Bld: 84 mg/dL (ref 70–99)

## 2010-12-05 MED ORDER — POTASSIUM CHLORIDE IN NACL 40-0.9 MEQ/L-% IV SOLN
INTRAVENOUS | Status: AC
  Filled 2010-12-05: qty 1000

## 2010-12-05 MED ORDER — DOCUSATE SODIUM 100 MG PO CAPS
100.0000 mg | ORAL_CAPSULE | Freq: Two times a day (BID) | ORAL | Status: DC
  Administered 2010-12-05 – 2010-12-14 (×20): 100 mg via ORAL
  Filled 2010-12-05 (×19): qty 1

## 2010-12-05 MED ORDER — PANTOPRAZOLE SODIUM 40 MG PO TBEC
40.0000 mg | DELAYED_RELEASE_TABLET | Freq: Every day | ORAL | Status: DC
  Administered 2010-12-05 – 2010-12-14 (×10): 40 mg via ORAL
  Filled 2010-12-05 (×10): qty 1

## 2010-12-05 MED ORDER — HYDROMORPHONE HCL 1 MG/ML IJ SOLN
1.0000 mg | Freq: Once | INTRAMUSCULAR | Status: AC
  Administered 2010-12-05: 1 mg via INTRAVENOUS
  Filled 2010-12-05: qty 1

## 2010-12-05 MED ORDER — ALUM & MAG HYDROXIDE-SIMETH 200-200-20 MG/5ML PO SUSP: 30.0000 mL | Freq: Four times a day (QID) | ORAL | Status: DC | PRN

## 2010-12-05 MED ORDER — POTASSIUM CHLORIDE 10 MEQ/100ML IV SOLN
10.0000 meq | INTRAVENOUS | Status: AC
  Administered 2010-12-05 (×2): 10 meq via INTRAVENOUS
  Filled 2010-12-05 (×2): qty 100

## 2010-12-05 MED ORDER — FENTANYL 12 MCG/HR TD PT72
12.5000 ug | MEDICATED_PATCH | TRANSDERMAL | Status: DC
  Administered 2010-12-05 – 2010-12-14 (×4): 12.5 ug via TRANSDERMAL
  Filled 2010-12-05 (×4): qty 1

## 2010-12-05 MED ORDER — MEGESTROL ACETATE 400 MG/10ML PO SUSP
200.0000 mg | Freq: Two times a day (BID) | ORAL | Status: DC
  Administered 2010-12-05 – 2010-12-14 (×20): 200 mg via ORAL
  Filled 2010-12-05 (×4): qty 10
  Filled 2010-12-05: qty 5
  Filled 2010-12-05 (×10): qty 10
  Filled 2010-12-05: qty 20
  Filled 2010-12-05: qty 5
  Filled 2010-12-05 (×2): qty 10
  Filled 2010-12-05: qty 5
  Filled 2010-12-05: qty 10
  Filled 2010-12-05 (×2): qty 5
  Filled 2010-12-05 (×2): qty 10

## 2010-12-05 MED ORDER — OXYCODONE HCL 5 MG PO TABS
5.0000 mg | ORAL_TABLET | Freq: Four times a day (QID) | ORAL | Status: DC | PRN
  Administered 2010-12-05: 5 mg via ORAL
  Filled 2010-12-05: qty 1

## 2010-12-05 MED ORDER — LORAZEPAM 1 MG PO TABS
1.0000 mg | ORAL_TABLET | ORAL | Status: DC | PRN
  Administered 2010-12-05 – 2010-12-08 (×4): 1 mg via ORAL
  Filled 2010-12-05 (×3): qty 1

## 2010-12-05 MED ORDER — OLMESARTAN MEDOXOMIL 5 MG PO TABS
5.0000 mg | ORAL_TABLET | Freq: Every day | ORAL | Status: DC
  Administered 2010-12-05 – 2010-12-09 (×5): 5 mg via ORAL
  Filled 2010-12-05 (×6): qty 1

## 2010-12-05 MED ORDER — POTASSIUM CHLORIDE IN NACL 40-0.9 MEQ/L-% IV SOLN
INTRAVENOUS | Status: DC
  Administered 2010-12-05: 04:00:00 via INTRAVENOUS
  Filled 2010-12-05: qty 1000

## 2010-12-05 MED ORDER — ONDANSETRON HCL 4 MG/2ML IJ SOLN
4.0000 mg | Freq: Once | INTRAMUSCULAR | Status: AC
  Administered 2010-12-05: 4 mg via INTRAVENOUS
  Filled 2010-12-05: qty 2

## 2010-12-05 MED ORDER — HYDROMORPHONE HCL 2 MG PO TABS
2.0000 mg | ORAL_TABLET | ORAL | Status: DC | PRN
  Administered 2010-12-05 (×2): 2 mg via ORAL
  Administered 2010-12-06 – 2010-12-07 (×7): 4 mg via ORAL
  Administered 2010-12-07: 2 mg via ORAL
  Administered 2010-12-07: 4 mg via ORAL
  Administered 2010-12-07: 2 mg via ORAL
  Administered 2010-12-07: 4 mg via ORAL
  Administered 2010-12-08: 2 mg via ORAL
  Administered 2010-12-08: 4 mg via ORAL
  Administered 2010-12-08 (×2): 2 mg via ORAL
  Administered 2010-12-09 – 2010-12-10 (×6): 4 mg via ORAL
  Administered 2010-12-10: 2 mg via ORAL
  Administered 2010-12-10 – 2010-12-11 (×3): 4 mg via ORAL
  Administered 2010-12-11 (×2): 2 mg via ORAL
  Administered 2010-12-11 – 2010-12-14 (×15): 4 mg via ORAL
  Filled 2010-12-05: qty 2
  Filled 2010-12-05 (×2): qty 1
  Filled 2010-12-05 (×18): qty 2
  Filled 2010-12-05: qty 1
  Filled 2010-12-05: qty 2
  Filled 2010-12-05 (×2): qty 1
  Filled 2010-12-05: qty 2
  Filled 2010-12-05: qty 1
  Filled 2010-12-05 (×3): qty 2
  Filled 2010-12-05: qty 1
  Filled 2010-12-05 (×2): qty 2
  Filled 2010-12-05 (×3): qty 1
  Filled 2010-12-05 (×3): qty 2
  Filled 2010-12-05: qty 1
  Filled 2010-12-05 (×2): qty 2
  Filled 2010-12-05: qty 1
  Filled 2010-12-05 (×6): qty 2
  Filled 2010-12-05: qty 1

## 2010-12-05 MED ORDER — ENSURE CLINICAL ST REVIGOR PO LIQD
237.0000 mL | Freq: Two times a day (BID) | ORAL | Status: DC
  Administered 2010-12-05 – 2010-12-07 (×5): 237 mL via ORAL
  Administered 2010-12-07: 15:00:00 via ORAL
  Administered 2010-12-08 – 2010-12-14 (×13): 237 mL via ORAL
  Administered 2010-12-14: 19:00:00 via ORAL

## 2010-12-05 MED ORDER — MORPHINE SULFATE 2 MG/ML IJ SOLN
2.0000 mg | INTRAMUSCULAR | Status: DC | PRN
  Administered 2010-12-05 (×2): 2 mg via INTRAVENOUS
  Filled 2010-12-05 (×2): qty 1

## 2010-12-05 MED ORDER — FOLIC ACID 1 MG PO TABS
1.0000 mg | ORAL_TABLET | Freq: Every day | ORAL | Status: DC
  Administered 2010-12-05 – 2010-12-14 (×10): 1 mg via ORAL
  Filled 2010-12-05 (×10): qty 1

## 2010-12-05 MED ORDER — LORAZEPAM 1 MG PO TABS: 1.0000 mg | ORAL_TABLET | ORAL | Status: DC | PRN

## 2010-12-05 MED ORDER — SUCRALFATE 1 GM/10ML PO SUSP
1.0000 g | Freq: Four times a day (QID) | ORAL | Status: DC
  Administered 2010-12-05 – 2010-12-14 (×39): 1 g via ORAL
  Filled 2010-12-05 (×40): qty 10

## 2010-12-05 MED ORDER — HYDROMORPHONE HCL 1 MG/ML IJ SOLN
0.5000 mg | INTRAMUSCULAR | Status: DC | PRN
  Administered 2010-12-05 – 2010-12-09 (×4): 0.5 mg via INTRAVENOUS
  Filled 2010-12-05 (×4): qty 1

## 2010-12-05 MED ORDER — ONDANSETRON HCL 4 MG/2ML IJ SOLN
4.0000 mg | Freq: Four times a day (QID) | INTRAMUSCULAR | Status: DC | PRN
  Administered 2010-12-05 – 2010-12-13 (×9): 4 mg via INTRAVENOUS
  Filled 2010-12-05 (×8): qty 2

## 2010-12-05 MED ORDER — SODIUM CHLORIDE 0.9 % IJ SOLN
INTRAMUSCULAR | Status: AC
  Filled 2010-12-05: qty 20

## 2010-12-05 MED ORDER — ONDANSETRON HCL 4 MG PO TABS
4.0000 mg | ORAL_TABLET | Freq: Four times a day (QID) | ORAL | Status: DC | PRN
  Administered 2010-12-07 – 2010-12-14 (×12): 4 mg via ORAL
  Filled 2010-12-05 (×13): qty 1

## 2010-12-05 MED ORDER — OXYCODONE HCL 5 MG PO TABS
ORAL_TABLET | ORAL | Status: AC
  Administered 2010-12-05: 5 mg
  Filled 2010-12-05: qty 1

## 2010-12-05 MED ORDER — SERTRALINE HCL 50 MG PO TABS
50.0000 mg | ORAL_TABLET | Freq: Every day | ORAL | Status: DC
  Administered 2010-12-05 – 2010-12-13 (×9): 50 mg via ORAL
  Filled 2010-12-05 (×9): qty 1

## 2010-12-05 NOTE — ED Notes (Signed)
Informed pt we are waiting for Hospitalist to come evaluate pt for admission.

## 2010-12-05 NOTE — Progress Notes (Signed)
Chart reviewed.  Subjective: Still nauseated. Has had no vomiting. Has had no melena or hematochezia. Doesn't feel well enough to go home. Pain is poorly controlled. A small amount of his breakfast.  Objective: Vital signs in last 24 hours: Filed Vitals:   12/04/10 2301 12/05/10 0109 12/05/10 0225 12/05/10 0544  BP: 168/90 124/77 144/84 128/67  Pulse: 78 84 82 76  Temp:   98 F (36.7 C) 98.6 F (37 C)  TempSrc:   Oral Axillary  Resp:   20 16  Height:   5\' 9"  (1.753 m)   Weight:   52.8 kg (116 lb 6.5 oz)   SpO2: 100% 99% 98%    Weight change:   Intake/Output Summary (Last 24 hours) at 12/05/10 1049 Last data filed at 12/05/10 0651  Gross per 24 hour  Intake 408.75 ml  Output    250 ml  Net 158.75 ml   General: Able to ambulate in his room. Appears more comfortable than he was during the last hospitalization. Lungs clear to auscultation bilaterally without wheezes rhonchi or rales Cardiovascular regular rate rhythm without murmurs gallops rubs Abdomen scaphoid soft nontender nondistended normal bowel sounds Extremities no clubbing cyanosis or edema Psychiatric flat affect, quiet voice, appears depressed  Lab Results: Basic Metabolic Panel:  Lab 12/05/10 9604 12/04/10 2121  NA 134* 131*  K 3.4* 3.0*  CL 106 99  CO2 20 20  GLUCOSE 84 87  BUN 5* 5*  CREATININE 0.56 0.50  CALCIUM 7.8* 8.6  MG -- --  PHOS -- --   Liver Function Tests:  Lab 12/04/10 2121 12/01/10 1746  AST 9 11  ALT <5 5  ALKPHOS 87 83  BILITOT 0.4 0.7  PROT 5.9* 6.0  ALBUMIN 3.3* 3.3*    Lab 12/04/10 2121 12/01/10 1746  LIPASE 32 29  AMYLASE -- --   No results found for this basename: AMMONIA:2 in the last 168 hours CBC:  Lab 12/05/10 0537 12/04/10 2121 12/01/10 1746  WBC 3.6* 4.4 --  NEUTROABS -- 2.2 1.6*  HGB 7.3* 8.5* --  HCT 21.3* 24.5* --  MCV 77.7* 77.3* --  PLT 63* 67* --   Cardiac Enzymes: No results found for this basename: CKTOTAL:3,CKMB:3,CKMBINDEX:3,TROPONINI:3 in the  last 168 hours BNP: No results found for this basename: POCBNP:3 in the last 168 hours D-Dimer: No results found for this basename: DDIMER:2 in the last 168 hours CBG: No results found for this basename: GLUCAP:6 in the last 168 hours Hemoglobin A1C: No results found for this basename: HGBA1C in the last 168 hours Fasting Lipid Panel: No results found for this basename: CHOL,HDL,LDLCALC,TRIG,CHOLHDL,LDLDIRECT in the last 540 hours Thyroid Function Tests: No results found for this basename: TSH,T4TOTAL,FREET4,T3FREE,THYROIDAB in the last 168 hours Anemia Panel: No results found for this basename: VITAMINB12,FOLATE,FERRITIN,TIBC,IRON,RETICCTPCT in the last 168 hours  Micro Results: No results found for this or any previous visit (from the past 240 hour(s)). Studies/Results: Dg Abd Acute W/chest  12/04/2010  *RADIOLOGY REPORT*  Clinical Data: Abdominal pain, nausea, vomiting, history colon cancer with surgery chemotherapy, hypertension, appendectomy, cholecystectomy, smoking  ACUTE ABDOMEN SERIES (ABDOMEN 2 VIEW & CHEST 1 VIEW)  Comparison: 11/15/2010  Findings: Normal heart size, mediastinal contours, and pulmonary vascularity. Minimal elongation of thoracic aorta noted. Left subclavian power port, tip projecting over SVC. Emphysematous changes without infiltrate or effusion. No pneumothorax. Diffuse osseous demineralization.  Gaseous distention of the proximal colon to the level of the distal sigmoid colon. Small amount of rectal gas though decompressed. Findings could  represent colonic ileus or distal colonic obstruction. Scattered surgical clips throughout abdomen. Few dilated air filled loops of small bowel also identified. No free intraperitoneal air or definite urinary tract calcification. Bones appear demineralized.  IMPRESSION: Emphysematous changes. Colonic dilatation to the level of distal sigmoid colon, with a few associated mildly dilated air-filled small bowel loops. Findings could  represent ileus or distal colonic obstruction.  Original Report Authenticated By: Lollie Marrow, M.D.   Scheduled Meds:   . docusate sodium  100 mg Oral BID  . feeding supplement  237 mL Oral BID BM  . fentaNYL  12.5 mcg Transdermal Q72H  . folic acid  1 mg Oral Daily  . HYDROmorphone      .  HYDROmorphone (DILAUDID) injection  1 mg Intravenous Once  .  HYDROmorphone (DILAUDID) injection  1 mg Intravenous Once  . megestrol  200 mg Oral BID  . olmesartan  5 mg Oral Daily  . ondansetron      . ondansetron  4 mg Intravenous Once  . oxyCODONE      . pantoprazole  40 mg Oral Q1200  . pantoprazole (PROTONIX) IV  40 mg Intravenous Once  . sertraline  50 mg Oral QHS  . sodium chloride      . sucralfate  1 g Oral QID  . DISCONTD: sodium chloride  500 mL Intravenous Once   Continuous Infusions:   . DISCONTD: sodium chloride    . DISCONTD: 0.9 % NaCl with KCl 40 mEq / L infusion 75 mL/hr at 12/05/10 0345   PRN Meds:.alum & mag hydroxide-simeth, HYDROmorphone, HYDROmorphone, LORazepam, ondansetron (ZOFRAN) IV, ondansetron, DISCONTD: LORazepam, DISCONTD: morphine, DISCONTD: oxyCODONE  Assessment/Plan: Principal Problem:  *Hematemesis Active Problems:  Anemia, chronic disease  Chronic abdominal pain  Adenocarcinoma of colon with mucinous features  S/P partial gastrectomy  Personal history of PE (pulmonary embolism)  Erosive esophagitis  Esophageal reflux disease  The patient's hematemesis has not returned. However, he has a drop in his hemoglobin. This may be somewhat delutional, but I will monitor him for another 24 hours to insure that it doesn't drop further. We will need to watch for melanoma or recurrence of the hematemesis. His blood pressure is stable.  His pain is poorly controlled. He reports that his pain is continuous but worse today. The Norco that he was taking at home does not relieve his pain nor does the morphine that he is getting IV as needed here. I will add a  Duragesic patch 12 1/2 mcg and change his breakthrough pain medication to oral Dilaudid 2-4 mg as needed. He can also have IV Dilaudid for pain unrelieved by the patch and pills.  LOS: 1 day   Yoneko Talerico L 12/05/2010, 10:49 AM

## 2010-12-05 NOTE — H&P (Signed)
  [id number] 

## 2010-12-05 NOTE — Progress Notes (Signed)
Daughter concerned with pt's hgb down to 7.3 today.  Wants provider to recheck CBC this evening instead of in the morning. Concerned that his hgb dropped down in less than 24h and concerned it could be lower now.  MD made aware and no new orders received.  Pt made aware.  Daughter had left already, pt will make her aware.

## 2010-12-05 NOTE — H&P (Signed)
[name], [name]              ACCOUNT NO.:  [id number]  MEDICAL RECORD NO.:  [id number]  LOCATION:  A338                          FACILITY:  [facility]  PHYSICIAN:  Tarry Kos, MD       DATE OF BIRTH:  May 12, 1950  DATE OF ADMISSION:  12/04/2010 DATE OF DISCHARGE:  LH                             HISTORY & PHYSICAL   CHIEF COMPLAINT:  Abdominal pain.  HISTORY OF PRESENT ILLNESS:  This is a 60 year old male with a history of colon cancer that was diagnosed in May 2012 who is status post hemicolectomy.  He is currently undergoing chemotherapy, 18 rounds who presents today because of chronic abdominal pain that is not very well controlled along with an episode of vomiting with some questionable hematemesis x1 this afternoon.  He actually had an EGD done several days ago, which showed known esophagitis.  He is also status post partial gastrectomy for many years ago.  He has been having chronic abdominal pain for over a year.  It sounds like he had a pretty extensive workup until they did an exploratory laparoscopy in May 2012, at which time they found the colon cancer.  After his surgery for the partial colectomy, he says that his abdominal pain prior to this surgery is basically the same as his pain now.  So, there really has been no change in the pain.  So, I am not really sure if this was ever really associated with his colon cancer in the first place.  He describes the pain has been periumbilical.  He has a very large midline eschar on the outside.  He has had multiple abdominal surgeries.  He describes the pain as constant.  It is basically always a 7-8/10.  He takes Norco 5 mg/500 twice a day only.  After he takes one Norco 5 mg tablet, it knocks his pain down from 7 to 5.  Since he was diagnosed with colon cancer, he has lost 30 pounds.  He has not been eating well, and again he says he is constantly having this chronic abdominal pain.  He also has a questionable history of  gastroparesis in the past.  He is followed by Dr. Jena Gauss.  He had a CT of his abdomen and pelvis when he was here in the ED several days ago because of the abdominal pain, which did not show any clear reason for the pain.  He denies any diarrhea.  Denies any fevers.  REVIEW OF SYSTEMS:  Otherwise negative.  PAST MEDICAL HISTORY: 1. Again recently diagnosed stage IIIB adenocarcinoma of the colon     status post colon resection.  Currently on chemotherapy on a total     of 18 rounds. 2. History of pulmonary emboli in December 2011, received 6 months of     Coumadin at that time, retrospectively is may have been due to his     underlying colon cancer. 3. Status post gastric surgery bypass with B12 deficiency syndrome. 4. Chronic nausea and vomiting. 5. 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 6. Hypertension. 7. Chronic anemia. 8. Erosive esophagitis with multiple EGDs in the past, recent 6     months.  ALLERGIES:  None.  MEDICATIONS: 1. He takes B12 injections. 2. Flexeril 10 mg as needed for muscle spasms. 3. Ensure once or twice a day. 4. Folic acid. 5. Norco 5 mg, only takes 2 dose a day. 6. Ativan 1 mg every 4 hours as needed for nausea and vomiting. 7. He is on Megace, Benicar, Prilosec, Zofran, Compazine, and Zoloft.  SOCIAL HISTORY:  He smokes half pack a day.  Denies alcohol.  No IV drug abuse.  He has been tolerating his chemotherapy pretty well.  PHYSICAL EXAMINATION:  VITAL SIGNS:  Temperature is 98, pulse 82, respirations 20, blood pressure 144/84, O2 sats 98% on room air. GENERAL:  He is alert and oriented x4.  No apparent stress, cooperative, friendly. HEENT:  Extraocular muscles intact.  Pupils equal and reactive to light. Oropharynx clear.  Mucous membranes moist. NECK:  No JVD.  No carotid bruits. CARDIAC:  Regular rate and rhythm without murmurs, rubs, or gallops. CHEST:  Clear to auscultation bilaterally.  No wheezes or rales. ABDOMEN:   Soft, mainly it is tender to palpation in periumbilical area and nondistended.  Positive bowel sounds.  No rebound, no guarding. Nonacute abdomen. EXTREMITIES:  No clubbing, cyanosis, or edema. PSYCHIATRY:  Normal mood and affect. NEUROLOGIC:  No focal neurologic deficits.  LABORATORY DATA:  His sodium is 131, potassium is 3. LFTs are normal. Lipase is 32, albumin is 3.3.  White count is 4.4, hemoglobin is 8.5 which is at his baseline, platelet count 67.  Acute abdominal series shows chronic dilation of the sigmoid colon, mildly dilated air filled small bowel loops, questionable ileus versus distal colonic obstruction, which clinically correlates to a benign abdominal exam.  ASSESSMENT/PLAN:  This is a 60 year old male with acute-on-chronic abdominal pain. 1. Acute-on-chronic abdominal pain of unclear etiology.  This could be     due to adhesions or to his colon cancer.  He is on very low dose of     Norco.  He wishes to switched oxycodone.  I am going to put him on     oxycodone 5 mg tablets 1-2 every 4 hours as needed for pain.  His     pain has obviously not been well controlled.  He has been having a     level 7-8/10 pain, at least the last 2 months.  I am not going to     repeat CAT scan of his abdomen and pelvis as he just had one of     those within the last week.  His abdominal exam is benign.  The     main reason for hospitalization was for the reported hematemesis.     His hemoglobin is at his baseline.  We will repeat this in the     morning.  If this is stable in the morning and he has no further     vomiting and his pain is better controlled, he possibly could be     discharged to home in the next 24 hours and have his pain     medications adjusted as an outpatient.  I have gone over this plan     with him and his daughter who is a Engineer, civil (consulting).  They understand this and     agree that his pain has not been well controlled and again he is on     very low dose of Norco.  He has  only taken 2 tablets a day.     Hopefully with better pain control, his appetite  will be improved     and it will help prevent him from losing more weight. 2. Erosive esophagitis.  We will continue his PPI and Carafate. 3. Questionable history of gastroparesis.  Further workup per     outpatient GI. 4. Colon cancer.  Currently, on his own chemotherapy.  His next     chemotherapy treatment is this coming up Monday.  He will likely be     discharged prior to that, so hopefully he will not miss his     chemotherapy treatment. 5. The patient is full code.  Further recommendation pending over     hospital course.                                           ______________________________ Tarry Kos, MD     RD/MEDQ  D:  12/05/2010  T:  12/05/2010  Job:  [id number]

## 2010-12-06 LAB — CBC
HCT: 21.3 % — ABNORMAL LOW (ref 39.0–52.0)
MCH: 26.7 pg (ref 26.0–34.0)
MCHC: 34.3 g/dL (ref 30.0–36.0)
RDW: 18.8 % — ABNORMAL HIGH (ref 11.5–15.5)

## 2010-12-06 LAB — URINALYSIS, ROUTINE W REFLEX MICROSCOPIC
Glucose, UA: 500 mg/dL — AB
Ketones, ur: NEGATIVE mg/dL
Leukocytes, UA: NEGATIVE
Nitrite: NEGATIVE
Specific Gravity, Urine: 1.025 (ref 1.005–1.030)
pH: 6 (ref 5.0–8.0)

## 2010-12-06 LAB — BASIC METABOLIC PANEL
BUN: 5 mg/dL — ABNORMAL LOW (ref 6–23)
Calcium: 7.9 mg/dL — ABNORMAL LOW (ref 8.4–10.5)
GFR calc Af Amer: >60 mL/min (ref >60)
GFR calc non Af Amer: >60 mL/min (ref >60)
Potassium: 3.1 mEq/L — ABNORMAL LOW (ref 3.5–5.1)
Sodium: 130 mEq/L — ABNORMAL LOW (ref 135–145)

## 2010-12-06 LAB — MAGNESIUM: Magnesium: 1.5 mg/dL (ref 1.5–2.5)

## 2010-12-06 MED ORDER — DIPHENHYDRAMINE HCL 50 MG/ML IJ SOLN
50.0000 mg | Freq: Once | INTRAMUSCULAR | Status: AC
  Administered 2010-12-06: 50 mg via INTRAVENOUS
  Filled 2010-12-06: qty 1

## 2010-12-06 MED ORDER — POTASSIUM CHLORIDE 10 MEQ/100ML IV SOLN
10.0000 meq | INTRAVENOUS | Status: AC
  Administered 2010-12-06 (×4): 10 meq via INTRAVENOUS
  Filled 2010-12-06 (×4): qty 100

## 2010-12-06 MED ORDER — ACETAMINOPHEN 325 MG PO TABS
650.0000 mg | ORAL_TABLET | Freq: Four times a day (QID) | ORAL | Status: DC | PRN
  Administered 2010-12-06: 650 mg via ORAL
  Filled 2010-12-06 (×2): qty 2

## 2010-12-06 MED ORDER — SODIUM CHLORIDE 0.9 % IJ SOLN
INTRAMUSCULAR | Status: AC
  Administered 2010-12-06: 22:00:00
  Filled 2010-12-06: qty 10

## 2010-12-06 NOTE — Progress Notes (Signed)
Writer notified Dr. Lendell Caprice that pt has spiked higher temp of 102.4 oral.  Md ordered to continue and give benadryl per md order.  HR up to 98 as well.  Pt and family made aware.

## 2010-12-06 NOTE — Progress Notes (Signed)
Daughter had checked temperature and it was 100.6, writer checked temperature oral it was 101.3. Pt receiving blood during this time going into 2nd hr, writer stopped blood and called MD.  Dr. Lendell Caprice made aware of temperature and blood transfusing.  MD stated to continue blood and give tylenol as ordered.  Family present at time.   Pt made aware.  Will continue to monitor.

## 2010-12-06 NOTE — Progress Notes (Signed)
Writer sent blood bag and all tubing sent to lab.  Urine obtained and sent.  Blood transfusion reaction sheet completed and sent as well.  Pt in no distress.

## 2010-12-06 NOTE — Progress Notes (Signed)
Patient febrile during transfusion. No chills. No shortness of breath. On exam, a sleep. Arousable and comfortable. Lungs are clear. Blood transfusion is nearly complete. Patient has received Tylenol and Benadryl. Will stop the transfusion and test for transfusion reaction. Blood pressure is stable. Suspect benign febrile transfusion reaction but we'll monitor closely. Discussed plans with patient and family.

## 2010-12-06 NOTE — Progress Notes (Addendum)
Subjective: Pain better controlled. Still nauseated, but able to tolerate small amounts of [name] trays. Feels very exhausted and weak. No energy. No further bleeding. No vomiting. No melena or hematochezia.  Objective: Vital signs in last 24 hours: Filed Vitals:   12/05/10 0544 12/05/10 1500 12/05/10 2102 12/06/10 0457  BP: 128/67 108/72 123/80 120/69  Pulse: 76 85 92 65  Temp: 98.6 F (37 C) 98.6 F (37 C) 98.8 F (37.1 C) 99.5 F (37.5 C)  TempSrc: Axillary     Resp: 16 16 20 16   Height:      Weight:      SpO2:  99% 99% 98%   Weight change:   Intake/Output Summary (Last 24 hours) at 12/06/10 1008 Last data filed at 12/06/10 0600  Gross per 24 hour  Intake    480 ml  Output   1600 ml  Net  -1120 ml   General:  Appears more comfortable than he was during the last hospitalization. Lungs clear to auscultation bilaterally without wheezes rhonchi or rales Cardiovascular regular rate rhythm without murmurs gallops rubs Abdomen scaphoid soft nontender nondistended normal bowel sounds Extremities no clubbing cyanosis or edema Psychiatric slightly brighter today.  Lab Results: Basic Metabolic Panel:  Lab 12/06/10 7829 12/05/10 0650  NA 130* 134*  K 3.1* 3.4*  CL 101 106  CO2 20 20  GLUCOSE 108* 84  BUN 5* 5*  CREATININE 0.57 0.56  CALCIUM 7.9* 7.8*  MG -- --  PHOS -- --   Liver Function Tests:  Lab 12/04/10 2121 12/01/10 1746  AST 9 11  ALT <5 5  ALKPHOS 87 83  BILITOT 0.4 0.7  PROT 5.9* 6.0  ALBUMIN 3.3* 3.3*    Lab 12/04/10 2121 12/01/10 1746  LIPASE 32 29  AMYLASE -- --   No results found for this basename: AMMONIA:2 in the last 168 hours CBC:  Lab 12/06/10 0635 12/05/10 0537 12/04/10 2121 12/01/10 1746  WBC 2.2* 3.6* -- --  NEUTROABS -- -- 2.2 1.6*  HGB 7.3* 7.3* -- --  HCT 21.3* 21.3* -- --  MCV 78.0 77.7* -- --  PLT 56* 63* -- --   Cardiac Enzymes: No results found for this basename: CKTOTAL:3,CKMB:3,CKMBINDEX:3,TROPONINI:3 in the last 168  hours BNP: No results found for this basename: POCBNP:3 in the last 168 hours D-Dimer: No results found for this basename: DDIMER:2 in the last 168 hours CBG: No results found for this basename: GLUCAP:6 in the last 168 hours Hemoglobin A1C: No results found for this basename: HGBA1C in the last 168 hours Fasting Lipid Panel: No results found for this basename: CHOL,HDL,LDLCALC,TRIG,CHOLHDL,LDLDIRECT in the last 562 hours Thyroid Function Tests: No results found for this basename: TSH,T4TOTAL,FREET4,T3FREE,THYROIDAB in the last 168 hours Anemia Panel: No results found for this basename: VITAMINB12,FOLATE,FERRITIN,TIBC,IRON,RETICCTPCT in the last 168 hours  Micro Results: No results found for this or any previous visit (from the past 240 hour(s)).  Scheduled Meds:    . docusate sodium  100 mg Oral BID  . feeding supplement  237 mL Oral BID BM  . fentaNYL  12.5 mcg Transdermal Q72H  . folic acid  1 mg Oral Daily  . megestrol  200 mg Oral BID  . olmesartan  5 mg Oral Daily  . pantoprazole  40 mg Oral Q1200  . potassium chloride  10 mEq Intravenous Q1 Hr x 2  . potassium chloride  10 mEq Intravenous Q1 Hr x 4  . sertraline  50 mg Oral QHS  . sodium  chloride      . sucralfate  1 g Oral QID   Continuous Infusions:    . DISCONTD: 0.9 % NaCl with KCl 40 mEq / L infusion 75 mL/hr at 12/05/10 0345   PRN Meds:.alum & mag hydroxide-simeth, HYDROmorphone, HYDROmorphone, LORazepam, ondansetron (ZOFRAN) IV, ondansetron, DISCONTD: LORazepam, DISCONTD: morphine, DISCONTD: oxyCODONE  Assessment/Plan: Principal Problem:  *Hematemesis Active Problems:  Anemia, chronic disease  Chronic abdominal pain Pancytopenia secondary to chemotherapy.  Adenocarcinoma of colon with mucinous features  S/P partial gastrectomy  Personal history of PE (pulmonary embolism)  Erosive esophagitis  Esophageal reflux disease  Pain is better controlled on current regimen. He has had no vomiting but remains  nauseated. He is tolerating a diet. He is anemic from acute GI blood loss, chemotherapy, chronic disease. He's very symptomatic and I will give 1 unit transfusion today. Monitor [name] pancytopenia. Increase activity after transfusion. Replete potassium and check magnesium. Notify heme/onc of patient's admission. Change to inpatient status.   LOS: 2 days   [name] L 12/06/2010, 10:08 AM

## 2010-12-07 ENCOUNTER — Inpatient Hospital Stay (HOSPITAL_COMMUNITY): Payer: Federal, State, Local not specified - PPO

## 2010-12-07 DIAGNOSIS — C189 Malignant neoplasm of colon, unspecified: Secondary | ICD-10-CM

## 2010-12-07 LAB — RETICULOCYTES
RBC.: 3.21 MIL/uL — ABNORMAL LOW (ref 4.22–5.81)
Retic Count, Absolute: 25.7 10*3/uL (ref 19.0–186.0)

## 2010-12-07 LAB — CBC
HCT: 25.6 % — ABNORMAL LOW (ref 39.0–52.0)
MCHC: 34.8 g/dL (ref 30.0–36.0)
Platelets: 67 10*3/uL — ABNORMAL LOW (ref 150–400)
RDW: 17.8 % — ABNORMAL HIGH (ref 11.5–15.5)
WBC: 1 10*3/uL — CL (ref 4.0–10.5)

## 2010-12-07 LAB — BASIC METABOLIC PANEL
BUN: 10 mg/dL (ref 6–23)
Chloride: 97 mEq/L (ref 96–112)
GFR calc Af Amer: >60 mL/min (ref >60)
GFR calc non Af Amer: >60 mL/min (ref >60)
Potassium: 3.4 mEq/L — ABNORMAL LOW (ref 3.5–5.1)

## 2010-12-07 LAB — URINALYSIS, ROUTINE W REFLEX MICROSCOPIC
Leukocytes, UA: NEGATIVE
Nitrite: NEGATIVE
Specific Gravity, Urine: 1.015 (ref 1.005–1.030)
pH: 5.5 (ref 5.0–8.0)

## 2010-12-07 LAB — DIFFERENTIAL
Basophils Absolute: 0 10*3/uL (ref 0.0–0.1)
Basophils Relative: 0 % (ref 0–1)
Lymphocytes Relative: 64 % — ABNORMAL HIGH (ref 12–46)
Neutro Abs: 0.2 10*3/uL — ABNORMAL LOW (ref 1.7–7.7)
Smear Review: DECREASED

## 2010-12-07 MED ORDER — VANCOMYCIN HCL IN DEXTROSE 1-5 GM/200ML-% IV SOLN
1000.0000 mg | Freq: Once | INTRAVENOUS | Status: AC
  Administered 2010-12-07: 1000 mg via INTRAVENOUS
  Filled 2010-12-07: qty 200

## 2010-12-07 MED ORDER — IBUPROFEN 800 MG PO TABS
800.0000 mg | ORAL_TABLET | Freq: Once | ORAL | Status: AC
  Administered 2010-12-07: 800 mg via ORAL
  Filled 2010-12-07: qty 1

## 2010-12-07 MED ORDER — POTASSIUM CHLORIDE 10 MEQ/100ML IV SOLN
10.0000 meq | Freq: Once | INTRAVENOUS | Status: AC
  Administered 2010-12-07: 10 meq via INTRAVENOUS
  Filled 2010-12-07: qty 100

## 2010-12-07 MED ORDER — VANCOMYCIN HCL IN DEXTROSE 1-5 GM/200ML-% IV SOLN
1000.0000 mg | Freq: Two times a day (BID) | INTRAVENOUS | Status: DC
  Administered 2010-12-07 – 2010-12-08 (×3): 1000 mg via INTRAVENOUS
  Filled 2010-12-07 (×4): qty 200

## 2010-12-07 MED ORDER — DEXTROSE 5 % IV SOLN
2.0000 g | Freq: Three times a day (TID) | INTRAVENOUS | Status: DC
  Administered 2010-12-07 – 2010-12-10 (×9): 2 g via INTRAVENOUS
  Filled 2010-12-07 (×14): qty 2

## 2010-12-07 MED ORDER — FILGRASTIM 300 MCG/ML IJ SOLN
300.0000 ug | Freq: Every day | INTRAMUSCULAR | Status: DC
  Administered 2010-12-07 – 2010-12-09 (×3): 300 ug via SUBCUTANEOUS
  Filled 2010-12-07 (×7): qty 1

## 2010-12-07 MED ORDER — SODIUM CHLORIDE 0.9 % IJ SOLN
INTRAMUSCULAR | Status: AC
  Administered 2010-12-07: 09:00:00
  Filled 2010-12-07: qty 10

## 2010-12-07 MED ORDER — SODIUM CHLORIDE 0.9 % IJ SOLN
INTRAMUSCULAR | Status: AC
  Administered 2010-12-07: 05:00:00
  Filled 2010-12-07: qty 10

## 2010-12-07 MED ORDER — POTASSIUM CHLORIDE 10 MEQ/100ML IV SOLN
10.0000 meq | INTRAVENOUS | Status: AC
  Administered 2010-12-07: 10 meq via INTRAVENOUS
  Filled 2010-12-07: qty 100

## 2010-12-07 NOTE — Consult Note (Signed)
[facility]  Name: [name]      MRN: [id number]    Location: Z308/M578-46  Date: 12/07/2010 Time:4:03 PM   REFERRING PHYSICIAN:  Crista Curb   REASON FOR CONSULT:  Colon cancer; Pancytopenia   DIAGNOSIS:  Stage III Colon Cancer  HISTORY OF PRESENT ILLNESS:    This is a 60 yo Caucasian male who is well known to the [facility].  He has a diagnosis of Stage III colon cancer, S/P resection and now 6 cycles of FOLFOX.  He is tolerating therapy well especially his first 4 cycles.  He now has been having only 2-3 "good days" between cycles. This is the first that the patient has brought this information to my attention.    He was admitted recently admitted for hematemesis following and EGD by Dr. Jena Gauss.  The patient reports that he underwent and EGD on Tuesday by Dr. Jena Gauss.  He then presented to the ER for hematemesis.  He now reports that that has resolved.  He denies any other instances of blood tinged, or coffee ground emesis.    Unfortunately, his blood counts have dropped likely secondary to chemotherapy.  He is now pancytopenic.  The patient reports fevers last night, but denies any chills, and night sweats.  The patient reports that he does have an appetite.  He is on Megace therapy.  He reports that he does have early satiety because he take 5-8 bites of food products and he is full.  The patient admits to weight loss since beginning therapy.  He weighed approximately 140 lbs at the beginning of therapy, and he now weighs close to 110 lbs.  Dr. Jena Gauss is following up on this patients complaint of early satiety.  This could be secondary to delayed emptying of the stomach.  The patient admits that he smokes 1 ppd of cigarettes.  He denies any alcohol abuse.  PAST MEDICAL HISTORY:   Past Medical History  Diagnosis Date  . 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 Pulmonary embolism 02/2010  . Acid reflux     . Hypertension   . Osteoporosis   . Arthritis   . TIA (transient ischemic attack) 10/11  . ETOH abuse     quit 03/2010  . 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 Personal history of PE (pulmonary embolism) 10/01/2010  . Blood transfusion   . S/P endoscopy September 28, 2010    erosive reflux esophagitis, Billroth I anatomy    ALLERGIES: No Known Allergies    MEDICATIONS: I have reviewed the patient's current medications.     PAST SURGICAL HISTORY Past Surgical History  Procedure Date  . Hernia repair     right inguinal  . Appendectomy 1980s  . Cholecystectomy 1980s  . Colon surgery May 2012    left  . Portacath placement   . Abdominal sugery     for bowel obstruction x 8  . Esophagogastroduodenoscopy 09/28/2010    Procedure: ESOPHAGOGASTRODUODENOSCOPY (EGD);  Surgeon: Corbin Ade, MD;  Location: [facility];  Service: Endoscopy;  Laterality: N/A;    FAMILY HISTORY: Family History  Problem Relation Age of Onset  . Hypertension Mother   . Hypertension Father   . Colon cancer Neg Hx     SOCIAL HISTORY:  reports that he has been smoking Cigarettes.  He has a 20 pack-year smoking history. He does not have any smokeless tobacco history on file. He reports  that he does not drink alcohol or use illicit drugs.  PERFORMANCE STATUS: The patient's performance status is 2 - Symptomatic, <50% confined to bed  PHYSICAL EXAM: Most Recent Vital Signs: Blood pressure 119/78, pulse 99, temperature 98.1 F (36.7 C), temperature source Oral, resp. rate 16, height 5\' 9"  (1.753 m), weight 116 lb 6.5 oz (52.8 kg), SpO2 98.00%. BP 119/78  Pulse 99  Temp(Src) 98.1 F (36.7 C) (Oral)  Resp 16  Ht 5\' 9"  (1.753 m)  Wt 116 lb 6.5 oz (52.8 kg)  BMI 17.19 kg/m2  SpO2 98% General appearance: alert, cooperative, combative and no distress Head: Normocephalic, without obvious abnormality, atraumatic Neck: trachea midline Lungs: clear to auscultation bilaterally Heart: regular rate and rhythm, S1,  S2 normal, no murmur, click, rub or gallop Abdomen: soft, non-tender; bowel sounds normal; no masses,  no organomegaly Extremities: extremities normal, atraumatic, no cyanosis or edema and pneumatic stockings in place and operating.  No LE edema, or discomfort Skin: Skin color, texture, turgor normal. No rashes or lesions Neurologic: Grossly normal  LABORATORY DATA:  Results for orders placed during the hospital encounter of 12/04/10 (from the past 48 hour(s))  MAGNESIUM     Status: Normal   Collection Time   12/06/10  6:00 AM      Component Value Range Comment   Magnesium 1.5  1.5 - 2.5 (mg/dL)   CBC     Status: Abnormal   Collection Time   12/06/10  6:35 AM      Component Value Range Comment   WBC 2.2 (*) 4.0 - 10.5 (K/uL)    RBC 2.73 (*) 4.22 - 5.81 (MIL/uL)    Hemoglobin 7.3 (*) 13.0 - 17.0 (g/dL)    HCT 16.1 (*) 09.6 - 52.0 (%)    MCV 78.0  78.0 - 100.0 (fL)    MCH 26.7  26.0 - 34.0 (pg)    MCHC 34.3  30.0 - 36.0 (g/dL)    RDW 04.5 (*) 40.9 - 15.5 (%)    Platelets 56 (*) 150 - 400 (K/uL) PLATELET COUNT CONFIRMED BY SMEAR  BASIC METABOLIC PANEL     Status: Abnormal   Collection Time   12/06/10  6:35 AM      Component Value Range Comment   Sodium 130 (*) 135 - 145 (mEq/L)    Potassium 3.1 (*) 3.5 - 5.1 (mEq/L)    Chloride 101  96 - 112 (mEq/L)    CO2 20  19 - 32 (mEq/L)    Glucose, Bld 108 (*) 70 - 99 (mg/dL)    BUN 5 (*) 6 - 23 (mg/dL)    Creatinine, Ser 8.11  0.50 - 1.35 (mg/dL)    Calcium 7.9 (*) 8.4 - 10.5 (mg/dL)    GFR calc non Af Amer >60  >60 (mL/min)    GFR calc Af Amer >60  >60 (mL/min)   PREPARE RBC (CROSSMATCH)     Status: Normal   Collection Time   12/06/10 10:15 AM      Component Value Range Comment   Order Confirmation ORDER PROCESSED BY BLOOD BANK     TYPE AND SCREEN     Status: Normal (Preliminary result)   Collection Time   12/06/10 10:15 AM      Component Value Range Comment   ABO/RH(D) O POS      Antibody Screen NEG      Sample Expiration  12/09/2010      Unit Number [id number]      Blood Component Type RED  CELLS,LR      Unit division 00      Status of Unit ALLOCATED      Transfusion Status OK TO TRANSFUSE      Crossmatch Result Compatible      Unit Number [id number]      Blood Component Type RED CELLS,LR      Unit division 00      Status of Unit ISSUED,FINAL      Transfusion Status OK TO TRANSFUSE      Crossmatch Result Compatible     URINALYSIS, ROUTINE W REFLEX MICROSCOPIC     Status: Abnormal   Collection Time   12/06/10  6:08 PM      Component Value Range Comment   Color, Urine YELLOW  YELLOW     Appearance CLEAR  CLEAR     Specific Gravity, Urine 1.025  1.005 - 1.030     pH 6.0  5.0 - 8.0     Glucose, UA 500 (*) NEGATIVE (mg/dL)    Hgb urine dipstick NEGATIVE  NEGATIVE     Bilirubin Urine NEGATIVE  NEGATIVE     Ketones, ur NEGATIVE  NEGATIVE (mg/dL)    Protein, ur NEGATIVE  NEGATIVE (mg/dL)    Urobilinogen, UA 0.2  0.0 - 1.0 (mg/dL)    Nitrite NEGATIVE  NEGATIVE     Leukocytes, UA NEGATIVE  NEGATIVE  MICROSCOPIC NOT DONE ON URINES WITH NEGATIVE PROTEIN, BLOOD, LEUKOCYTES, NITRITE, OR GLUCOSE <1000 mg/dL.  TRANSFUSION REACTION     Status: Normal   Collection Time   12/06/10  6:50 PM      Component Value Range Comment   Post RXN DAT IgG NEG      DAT C3 NEG      Path interp tx rxn null     CBC     Status: Abnormal   Collection Time   12/07/10  5:16 AM      Component Value Range Comment   WBC 1.0 (*) 4.0 - 10.5 (K/uL)    RBC 3.26 (*) 4.22 - 5.81 (MIL/uL)    Hemoglobin 8.9 (*) 13.0 - 17.0 (g/dL) DELTA CHECK NOTED   HCT 25.6 (*) 39.0 - 52.0 (%)    MCV 78.5  78.0 - 100.0 (fL)    MCH 27.3  26.0 - 34.0 (pg)    MCHC 34.8  30.0 - 36.0 (g/dL)    RDW 40.9 (*) 81.1 - 15.5 (%)    Platelets 67 (*) 150 - 400 (K/uL)   BASIC METABOLIC PANEL     Status: Abnormal   Collection Time   12/07/10  5:16 AM      Component Value Range Comment   Sodium 126 (*) 135 - 145 (mEq/L)    Potassium 3.4 (*) 3.5 - 5.1 (mEq/L)     Chloride 97  96 - 112 (mEq/L)    CO2 18 (*) 19 - 32 (mEq/L)    Glucose, Bld 103 (*) 70 - 99 (mg/dL)    BUN 10  6 - 23 (mg/dL)    Creatinine, Ser 9.14  0.50 - 1.35 (mg/dL) DELTA CHECK NOTED   Calcium 8.2 (*) 8.4 - 10.5 (mg/dL)    GFR calc non Af Amer >60  >60 (mL/min)    GFR calc Af Amer >60  >60 (mL/min)   DIFFERENTIAL     Status: Abnormal   Collection Time   12/07/10  5:16 AM      Component Value Range Comment   Neutrophils Relative 25 (*) 43 - 77 (%)  Neutro Abs 0.2 (*) 1.7 - 7.7 (K/uL)    Lymphocytes Relative 64 (*) 12 - 46 (%)    Lymphs Abs 0.6 (*) 0.7 - 4.0 (K/uL)    Monocytes Relative 10  3 - 12 (%)    Monocytes Absolute 0.1  0.1 - 1.0 (K/uL)    Eosinophils Relative 1  0 - 5 (%)    Eosinophils Absolute 0.0  0.0 - 0.7 (K/uL)    Basophils Relative 0  0 - 1 (%)    Basophils Absolute 0.0  0.0 - 0.1 (K/uL)    Smear Review PLATELETS APPEAR DECREASED     CULTURE, BLOOD (ROUTINE X 2)     Status: Normal (Preliminary result)   Collection Time   12/07/10  7:22 AM      Component Value Range Comment   Specimen Description Peripheral LEFT ARM 6CC      Special Requests Immunocompromised      Culture NO GROWTH <24 HRS      Report Status PENDING     CULTURE, BLOOD (ROUTINE X 2)     Status: Normal (Preliminary result)   Collection Time   12/07/10  7:25 AM      Component Value Range Comment   Specimen Description Peripheral DRN 6CC      Special Requests Immunocompromised      Culture NO GROWTH <24 HRS      Report Status PENDING         RADIOGRAPHY: Dg Chest 2 View  12/07/2010  *RADIOLOGY REPORT*  Clinical Data: Shortness of breath, immunocompromised  CHEST - 2 VIEW  Comparison: 09/29/2010  Findings: Normal heart size and pulmonary vascularity. Tortuous aorta. Mild eventration left diaphragm. Emphysematous changes without infiltrate or effusion. No pneumothorax. Bones demineralized. Perigastric surgical clips. Left subclavian Port-A-Cath, tip SVC.  IMPRESSION: Emphysematous changes  without acute infiltrate.  Original Report Authenticated By: Lollie Marrow, M.D.        ASSESSMENT:  1. Pancytopenia 2. Stage III Colon Cancer, S/P resection and 6 cycles of FOLFOX chemotherapy 3. Nausea, well controlled with medications 4. Tobacco abuse 5. Early satiety 6. Weight loss  PLAN:  1. Continue with antibiotics 2. Continue with Neupogen as recommended by Dr. Mariel Sleet 3. Continue neutropenic precautions 4. Will continue to follow   All questions were answered. The patient knows to call the clinic with any problems, questions or concerns. We can certainly see the patient much sooner if necessary.  The patient and plan discussed with Glenford Peers, MD and he is in agreement with the aforementioned.  Maddilyn Campus

## 2010-12-07 NOTE — Progress Notes (Signed)
He remained febrile overnight, well past completion of his blood transfusion. Blood cultures, urinalysis, chest x-ray ordered by on-call physician.  Subjective: Denies any change in his chronic cough. Denies dysuria. Denies rash or sores. No diarrhea. His pain and nausea are better controlled.  Objective: Vital signs in last 24 hours: Filed Vitals:   12/06/10 2200 12/07/10 0210 12/07/10 0351 12/07/10 0551  BP: 132/69   105/67  Pulse: 91   86  Temp: 102.7 F (39.3 C) 102.9 F (39.4 C) 98.9 F (37.2 C) 98.3 F (36.8 C)  TempSrc: Oral   Oral  Resp: 20   16  Height:      Weight:      SpO2: 99%   97%   Weight change:   Intake/Output Summary (Last 24 hours) at 12/07/10 0900 Last data filed at 12/06/10 1800  Gross per 24 hour  Intake 1297.5 ml  Output    375 ml  Net  922.5 ml   General:  Alert. Oriented. Sitting up in bed eating breakfast. Nontoxic. Lungs clear to auscultation bilaterally without wheezes rhonchi or rales Cardiovascular regular rate rhythm without murmurs gallops rubs Abdomen scaphoid soft nontender nondistended normal bowel sounds Extremities no clubbing cyanosis or edema Skin: No rash.  Lab Results: Basic Metabolic Panel:  Lab 12/07/10 1191 12/06/10 0635 12/06/10 0600  NA 126* 130* --  K 3.4* 3.1* --  CL 97 101 --  CO2 18* 20 --  GLUCOSE 103* 108* --  BUN 10 5* --  CREATININE 0.87 0.57 --  CALCIUM 8.2* 7.9* --  MG -- -- 1.5  PHOS -- -- --   Liver Function Tests:  Lab 12/04/10 2121 12/01/10 1746  AST 9 11  ALT <5 5  ALKPHOS 87 83  BILITOT 0.4 0.7  PROT 5.9* 6.0  ALBUMIN 3.3* 3.3*    Lab 12/04/10 2121 12/01/10 1746  LIPASE 32 29  AMYLASE -- --   No results found for this basename: AMMONIA:2 in the last 168 hours CBC:  Lab 12/07/10 0516 12/06/10 0635 12/04/10 2121  WBC 1.0* 2.2* --  NEUTROABS 0.2* -- 2.2  HGB 8.9* 7.3* --  HCT 25.6* 21.3* --  MCV 78.5 78.0 --  PLT 67* 56* --   Cardiac Enzymes: No results found for this  basename: CKTOTAL:3,CKMB:3,CKMBINDEX:3,TROPONINI:3 in the last 168 hours BNP: No results found for this basename: POCBNP:3 in the last 168 hours D-Dimer: No results found for this basename: DDIMER:2 in the last 168 hours CBG: No results found for this basename: GLUCAP:6 in the last 168 hours Hemoglobin A1C: No results found for this basename: HGBA1C in the last 168 hours Fasting Lipid Panel: No results found for this basename: CHOL,HDL,LDLCALC,TRIG,CHOLHDL,LDLDIRECT in the last 478 hours Thyroid Function Tests: No results found for this basename: TSH,T4TOTAL,FREET4,T3FREE,THYROIDAB in the last 168 hours Anemia Panel: No results found for this basename: VITAMINB12,FOLATE,FERRITIN,TIBC,IRON,RETICCTPCT in the last 168 hours  Micro Results: Recent Results (from the past 240 hour(s))  CULTURE, BLOOD (ROUTINE X 2)     Status: Normal (Preliminary result)   Collection Time   12/07/10  7:22 AM      Component Value Range Status Comment   Specimen Description Peripheral LEFT ARM [facility]   Final    Special Requests Immunocompromised   Final    Culture PENDING   Incomplete    Report Status PENDING   Incomplete   CULTURE, BLOOD (ROUTINE X 2)     Status: Normal (Preliminary result)   Collection Time   12/07/10  7:25  AM      Component Value Range Status Comment   Specimen Description Peripheral DRN [facility]   Final    Special Requests Immunocompromised   Final    Culture PENDING   Incomplete    Report Status PENDING   Incomplete     Scheduled Meds:    . ceFEPime (MAXIPIME) IV  2 g Intravenous Q8H  . diphenhydrAMINE  50 mg Intravenous Once  . docusate sodium  100 mg Oral BID  . feeding supplement  237 mL Oral BID BM  . fentaNYL  12.5 mcg Transdermal Q72H  . filgrastim  300 mcg Subcutaneous Daily  . folic acid  1 mg Oral Daily  . ibuprofen  800 mg Oral Once  . megestrol  200 mg Oral BID  . olmesartan  5 mg Oral Daily  . pantoprazole  40 mg Oral Q1200  . potassium chloride  10 mEq Intravenous Q1  Hr x 4  . sertraline  50 mg Oral QHS  . sodium chloride      . sodium chloride      . sodium chloride      . sucralfate  1 g Oral QID  . vancomycin  1,000 mg Intravenous Once  . vancomycin  1,000 mg Intravenous Q12H   Continuous Infusions:   PRN Meds:.acetaminophen, alum & mag hydroxide-simeth, HYDROmorphone, HYDROmorphone, LORazepam, ondansetron (ZOFRAN) IV, ondansetron  Urinalysis done yesterday showed no blood no nitrites no leukocyte esterase. Urine cultures have been drawn and are pending. Chest x-ray has not yet been done.  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  Adenocarcinoma of colon with mucinous features  S/P partial gastrectomy  Personal history of PE (pulmonary embolism)  Erosive esophagitis  Esophageal reflux disease Febrile neutropenia Hyponatremia Hypokalemia  Will followup fever workup. He has been started empirically on vancomycin and cefepime. He is on neutropenic precautions and I've added to differential to CBC. I have consulted Dr. Laurie Panda. He was started on Neupogen. He will be unable to get chemotherapy as scheduled today do to fever or or pancytopenia etc. His hemoglobin has risen appropriately after transfusion. He has pain and nausea are better controlled currently. Continue potassium repletion. Monitor sodium.    LOS: 3 days   Zulay Corrie L 12/07/2010, 9:00 AM

## 2010-12-07 NOTE — Consult Note (Signed)
ANTIBIOTIC CONSULT NOTE - INITIAL  Pharmacy Consult for vancomycin Indication: febrile neutropenia  No Known Allergies  Patient Measurements: Height: 5\' 9"  (175.3 cm) Weight: 116 lb 6.5 oz (52.8 kg) IBW/kg (Calculated) : 70.7   Vital Signs: Temp: 98.3 F (36.8 C) (09/17 0551) Temp src: Oral (09/17 0551) BP: 105/67 mmHg (09/17 0551) Pulse Rate: 86  (09/17 0551) Intake/Output from previous day: 09/16 0701 - 09/17 0700 In: 1537.5 [P.O.:700; I.V.:500; Blood:337.5] Out: 375 [Urine:375] Intake/Output from this shift:    Labs:  Basename 12/07/10 0516 12/06/10 0635 12/05/10 0650 12/05/10 0537  WBC 1.0* 2.2* -- 3.6*  HGB 8.9* 7.3* -- 7.3*  PLT 67* 56* -- 63*  LABCREA -- -- -- --  CREATININE 0.87 0.57 0.56 --  CRCLEARANCE -- -- -- --   No results found for this basename: VANCOTROUGH:2,VANCOPEAK:2,VANCORANDOM:2,GENTTROUGH:2,GENTPEAK:2,GENTRANDOM:2,TOBRATROUGH:2,TOBRAPEAK:2,TOBRARND:2,AMIKACINPEAK:2,AMIKACINTROU:2,AMIKACIN:2, in the last 72 hours   Microbiology: No results found for this or any previous visit (from the past 720 hour(s)).  Medical History: Past Medical History  Diagnosis Date  . 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 Pulmonary embolism 02/2010  . Acid reflux   . Hypertension   . Osteoporosis   . Arthritis   . TIA (transient ischemic attack) 10/11  . ETOH abuse     quit 03/2010  . 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 Personal history of PE (pulmonary embolism) 10/01/2010  . Blood transfusion   . S/P endoscopy September 28, 2010    erosive reflux esophagitis, Billroth I anatomy   Medications:  Scheduled:    . ceFEPime (MAXIPIME) IV  2 g Intravenous Q8H  . diphenhydrAMINE  50 mg Intravenous Once  . docusate sodium  100 mg Oral BID  . feeding supplement  237 mL Oral BID BM  . fentaNYL  12.5 mcg Transdermal Q72H  . filgrastim  300 mcg Subcutaneous Daily  . folic acid  1 mg Oral Daily  . ibuprofen  800 mg Oral Once  . megestrol  200  mg Oral BID  . olmesartan  5 mg Oral Daily  . pantoprazole  40 mg Oral Q1200  . potassium chloride  10 mEq Intravenous Q1 Hr x 4  . sertraline  50 mg Oral QHS  . sodium chloride      . sodium chloride      . sucralfate  1 g Oral QID  . vancomycin  1,000 mg Intravenous Once  . vancomycin  1,000 mg Intravenous Q12H   Assessment: Febrile neutropenia Renal fxn OK Also on Cefepime  Goal of Therapy:  Vancomycin trough level 15-20 mcg/ml  Plan: Vancomycin 1gm iv q12hrs Check trough at steady state Monitor renal fxn.  [name] A 12/07/2010,7:43 AM

## 2010-12-07 NOTE — Progress Notes (Signed)
CRITICAL VALUE ALERT  Critical value received:  WBC 1.0  Date of notification:  12/07/10  Time of notification:  0625  Critical value read back:yes  Nurse who received alert:  Lawson Fiscal RN  MD notified (1st page):  Onalee Hua   Time of first page:  0630  MD notified (2nd page):  Time of second page:  Responding MD: Onalee Hua  Time MD responded:  [phone]

## 2010-12-07 NOTE — Telephone Encounter (Signed)
LMOM

## 2010-12-08 DIAGNOSIS — C189 Malignant neoplasm of colon, unspecified: Secondary | ICD-10-CM

## 2010-12-08 LAB — BASIC METABOLIC PANEL
CO2: 19 mEq/L (ref 19–32)
Calcium: 8.4 mg/dL (ref 8.4–10.5)
Chloride: 100 mEq/L (ref 96–112)
GFR calc Af Amer: >60 mL/min (ref >60)
Sodium: 129 mEq/L — ABNORMAL LOW (ref 135–145)

## 2010-12-08 LAB — DIFFERENTIAL
Blasts: 0 %
Eosinophils Absolute: 0 10*3/uL (ref 0.0–0.7)
Eosinophils Relative: 1 % (ref 0–5)
Lymphocytes Relative: 82 % — ABNORMAL HIGH (ref 12–46)
Lymphs Abs: 1.5 10*3/uL (ref 0.7–4.0)
Metamyelocytes Relative: 0 %
Monocytes Absolute: 0.2 10*3/uL (ref 0.1–1.0)
Monocytes Relative: 9 % (ref 3–12)
Neutro Abs: 0.1 10*3/uL — ABNORMAL LOW (ref 1.7–7.7)
Neutrophils Relative %: 8 % — ABNORMAL LOW (ref 43–77)
nRBC: 0 /100 WBC

## 2010-12-08 LAB — CBC
MCV: 78 fL (ref 78.0–100.0)
Platelets: 69 10*3/uL — ABNORMAL LOW (ref 150–400)
RBC: 2.96 MIL/uL — ABNORMAL LOW (ref 4.22–5.81)
RDW: 18.3 % — ABNORMAL HIGH (ref 11.5–15.5)
WBC: 1.8 10*3/uL — ABNORMAL LOW (ref 4.0–10.5)

## 2010-12-08 LAB — IRON AND TIBC: Iron: <10 ug/dL — ABNORMAL LOW (ref 42–135)

## 2010-12-08 LAB — URINE CULTURE

## 2010-12-08 LAB — FOLATE: Folate: >20 ng/mL

## 2010-12-08 LAB — TRANSFUSION REACTION
DAT C3: NEGATIVE
Post RXN DAT IgG: NEGATIVE

## 2010-12-08 LAB — FERRITIN: Ferritin: 272 ng/mL (ref 22–322)

## 2010-12-08 MED ORDER — SODIUM CHLORIDE 0.9 % IJ SOLN
INTRAMUSCULAR | Status: AC
  Filled 2010-12-08: qty 10

## 2010-12-08 MED ORDER — SODIUM CHLORIDE 0.9 % IV SOLN
1020.0000 mg | Freq: Once | INTRAVENOUS | Status: AC
  Administered 2010-12-08: 1020 mg via INTRAVENOUS
  Filled 2010-12-08 (×2): qty 34

## 2010-12-08 NOTE — Progress Notes (Signed)
Subjective: Feels weak. Has no motivation. Tired of being sick. Abdominal pain and nausea better controlled.  Objective: Vital signs in last 24 hours: Filed Vitals:   12/07/10 1451 12/07/10 2216 12/08/10 0557 12/08/10 1421  BP: 119/78 82/49 87/59  93/57  Pulse: 99 95 94 103  Temp: 98.1 F (36.7 C) 97.9 F (36.6 C) 99.1 F (37.3 C) 98.2 F (36.8 C)  TempSrc: Oral Oral Oral Oral  Resp: 16 16 16 18   Height:      Weight:      SpO2: 98% 100% 89% 95%   Weight change:   Intake/Output Summary (Last 24 hours) at 12/08/10 1515 Last data filed at 12/08/10 1300  Gross per 24 hour  Intake    470 ml  Output   1150 ml  Net   -680 ml   General:  Alert. Oriented. Sitting up in bed eating breakfast. Nontoxic. Lungs clear to auscultation bilaterally without wheezes rhonchi or rales Cardiovascular regular rate rhythm without murmurs gallops rubs Abdomen scaphoid soft nontender nondistended normal bowel sounds Extremities no clubbing cyanosis or edema Skin: No rash.  Lab Results: Basic Metabolic Panel:  Lab 12/08/10 4098 12/07/10 0516 12/06/10 0600  NA 129* 126* --  K 3.7 3.4* --  CL 100 97 --  CO2 19 18* --  GLUCOSE 101* 103* --  BUN 16 10 --  CREATININE 0.79 0.87 --  CALCIUM 8.4 8.2* --  MG -- -- 1.5  PHOS -- -- --   Liver Function Tests:  Lab 12/04/10 2121 12/01/10 1746  AST 9 11  ALT <5 5  ALKPHOS 87 83  BILITOT 0.4 0.7  PROT 5.9* 6.0  ALBUMIN 3.3* 3.3*    Lab 12/04/10 2121 12/01/10 1746  LIPASE 32 29  AMYLASE -- --   No results found for this basename: AMMONIA:2 in the last 168 hours CBC:  Lab 12/08/10 0610 12/07/10 0516  WBC 1.8* 1.0*  NEUTROABS 0.1* 0.2*  HGB 8.1* 8.9*  HCT 23.1* 25.6*  MCV 78.0 78.5  PLT 69* 67*   Cardiac Enzymes: No results found for this basename: CKTOTAL:3,CKMB:3,CKMBINDEX:3,TROPONINI:3 in the last 168 hours BNP: No results found for this basename: POCBNP:3 in the last 168 hours D-Dimer: No results found for this basename:  DDIMER:2 in the last 168 hours CBG: No results found for this basename: GLUCAP:6 in the last 168 hours Hemoglobin A1C: No results found for this basename: HGBA1C in the last 168 hours Fasting Lipid Panel: No results found for this basename: CHOL,HDL,LDLCALC,TRIG,CHOLHDL,LDLDIRECT in the last 119 hours Thyroid Function Tests: No results found for this basename: TSH,T4TOTAL,FREET4,T3FREE,THYROIDAB in the last 168 hours Anemia Panel:  Lab 12/07/10 0516  VITAMINB12 747  FOLATE >20.0  FERRITIN 272  TIBC Not calculated due to Iron <10.  IRON <10*  RETICCTPCT 0.8    Micro Results: Recent Results (from the past 240 hour(s))  CULTURE, BLOOD (ROUTINE X 2)     Status: Normal (Preliminary result)   Collection Time   12/07/10  7:22 AM      Component Value Range Status Comment   Specimen Description BLOOD LEFT ARM   Final    Special Requests     Final    Value: BOTTLES DRAWN AEROBIC AND ANAEROBIC 6CC IMMUNE:COMPRM   Culture NO GROWTH 1 DAY   Final    Report Status PENDING   Incomplete   CULTURE, BLOOD (ROUTINE X 2)     Status: Normal (Preliminary result)   Collection Time   12/07/10  7:25 AM  Component Value Range Status Comment   Specimen Description BLOOD PORTA CATH DRAWN BY RN   Final    Special Requests     Final    Value: BOTTLES DRAWN AEROBIC AND ANAEROBIC 6CC  IMMUNE:COMPRM   Culture NO GROWTH 1 DAY   Final    Report Status PENDING   Incomplete     Scheduled Meds:    . ceFEPime (MAXIPIME) IV  2 g Intravenous Q8H  . docusate sodium  100 mg Oral BID  . feeding supplement  237 mL Oral BID BM  . fentaNYL  12.5 mcg Transdermal Q72H  . ferumoxytol  1,020 mg Intravenous Once  . filgrastim  300 mcg Subcutaneous Daily  . folic acid  1 mg Oral Daily  . megestrol  200 mg Oral BID  . olmesartan  5 mg Oral Daily  . pantoprazole  40 mg Oral Q1200  . sertraline  50 mg Oral QHS  . sodium chloride      . sucralfate  1 g Oral QID  . vancomycin  1,000 mg Intravenous Q12H    Continuous Infusions:   PRN Meds:.acetaminophen, alum & mag hydroxide-simeth, HYDROmorphone, HYDROmorphone, LORazepam, ondansetron (ZOFRAN) IV, ondansetron  Urinalysis done yesterday showed no blood no nitrites no leukocyte esterase. Urine cultures have been drawn and are pending. Chest x-ray has not yet been done.  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  Adenocarcinoma of colon with mucinous features  S/P partial gastrectomy  Personal history of PE (pulmonary embolism)  Erosive esophagitis  Esophageal reflux disease Febrile neutropenia Hyponatremia, improved Hypokalemia, resolved  Patient's pain and nausea is better controlled. He's had no further bleeding. His appetite is still poor. He is on Neupogen but his ANC is only 140 today. He's had no further high fevers. Continue IV vancomycin and cefepime for another 24-48 hours or until cultures are negative and or neutropenia is resolved. Currently getting IV iron.    LOS: 4 days   Ingri Diemer L 12/08/2010, 3:15 PM

## 2010-12-08 NOTE — Telephone Encounter (Signed)
Pt is aware of OV for 10/30 at 0900 with LSL

## 2010-12-08 NOTE — Progress Notes (Signed)
Subjective:  The patient reports that he feels more "crappy" today compared to yesterday.  He continues to have some nausea and abdominal pain.  He explains that his abdominal pain is better controlled now.  The patient reports that he continues to have an appetite, but he experiences early satiety.  The patient denies any chest pain, SOB, fevers, chills, night sweats, heart palpitations, blood in stool, black tarry stool, hematuria, and urinary difficulties.  I spent time with the patient going over his anemia panel results.  I personally reviewed and went over laboratory results with the patient.  I explained to him that his ferritin is normal, but his serum iron is low.  He understands that his ferritin may be falsely elevated since it is an acute phase reactant.  I went over the pathophysiology and etiology of iron deficiency anemia.  He likely is a poor absorber of iron.  Objective: Vital signs in last 24 hours: Temp:  [97.9 F (36.6 C)-99.1 F (37.3 C)] 99.1 F (37.3 C) (09/18 0557) Pulse Rate:  [94-99] 94  (09/18 0557) Resp:  [16] 16  (09/18 0557) BP: (82-119)/(49-78) 87/59 mmHg (09/18 0557) SpO2:  [89 %-100 %] 89 % (09/18 0557)  Intake/Output from previous day: 09/17 0800 - 09/18 0759 In: 650 [P.O.:650] Out: 800 [Urine:800] Intake/Output this shift: Total I/O In: 120 [P.O.:120] Out: -   General appearance: alert, cooperative, cachectic and no distress Resp: clear to auscultation bilaterally Cardio: regular rate and rhythm, S1, S2 normal, no murmur, click, rub or gallop GI: soft, non-tender; bowel sounds normal; no masses,  no organomegaly Extremities: extremities normal, atraumatic, no cyanosis or edema  Lab Results:  CBC    Component Value Date/Time   WBC 1.8* 12/08/2010 0610   RBC 2.96* 12/08/2010 0610   HGB 8.1* 12/08/2010 0610   HCT 23.1* 12/08/2010 0610   PLT 69* 12/08/2010 0610   MCV 78.0 12/08/2010 0610   MCH 27.4 12/08/2010 0610   MCHC 35.1 12/08/2010 0610   RDW  18.3* 12/08/2010 0610   LYMPHSABS 1.5 12/08/2010 0610   MONOABS 0.2 12/08/2010 0610   EOSABS 0.0 12/08/2010 0610   BASOSABS 0.0 12/08/2010 0610    BMET  Basename 12/08/10 0610 12/07/10 0516  NA 129* 126*  K 3.7 3.4*  CL 100 97  CO2 19 18*  GLUCOSE 101* 103*  BUN 16 10  CREATININE 0.79 0.87  CALCIUM 8.4 8.2*   Lab Results  Component Value Date   IRON <10* 12/07/2010   TIBC Not calculated due to Iron <10. 12/07/2010   FERRITIN 272 12/07/2010    Studies/Results: Dg Chest 2 View  12/07/2010  *RADIOLOGY REPORT*  Clinical Data: Shortness of breath, immunocompromised  CHEST - 2 VIEW  Comparison: 09/29/2010  Findings: Normal heart size and pulmonary vascularity. Tortuous aorta. Mild eventration left diaphragm. Emphysematous changes without infiltrate or effusion. No pneumothorax. Bones demineralized. Perigastric surgical clips. Left subclavian Port-A-Cath, tip SVC.  IMPRESSION: Emphysematous changes without acute infiltrate.  Original Report Authenticated By: Lollie Marrow, M.D.    Medications: I have reviewed the patient's current medications.  Assessment/Plan: 1. Iron deficiency anemia.  MCV is at the lower limits of normal.  Serum Iron is low.  Ferritin is within normal limits, but this an acute phase reactant and is likely falsely elevated in light of the low serum iron.  TIBC could not be calculated.  Will order 1020 mg of Feraheme IV today. 2. Anemia, secondary to #1 3. Pancytopenia, secondary to chemotherapy, on Neupogen, slight  improvement compared to yesterday's lab work.  Blood cultures pending.  Negative UA.  Continue empiric antibiotics. 4. Early satiety, followed by GI 5. Weight loss secondary to #4 6. Hematemesis, resolved. 7. Hyponatremia   LOS: 4 days    Jadasia Haws 12/08/2010

## 2010-12-09 ENCOUNTER — Encounter: Payer: Self-pay | Admitting: Internal Medicine

## 2010-12-09 ENCOUNTER — Inpatient Hospital Stay (HOSPITAL_COMMUNITY): Payer: Federal, State, Local not specified - PPO | Admitting: Oncology

## 2010-12-09 ENCOUNTER — Encounter (HOSPITAL_COMMUNITY): Payer: Federal, State, Local not specified - PPO

## 2010-12-09 ENCOUNTER — Encounter (HOSPITAL_COMMUNITY): Payer: Self-pay | Admitting: Internal Medicine

## 2010-12-09 DIAGNOSIS — C189 Malignant neoplasm of colon, unspecified: Secondary | ICD-10-CM

## 2010-12-09 LAB — CBC
Hemoglobin: 8.3 g/dL — ABNORMAL LOW (ref 13.0–17.0)
MCH: 27.1 pg (ref 26.0–34.0)
MCHC: 34.9 g/dL (ref 30.0–36.0)
RDW: 18.5 % — ABNORMAL HIGH (ref 11.5–15.5)

## 2010-12-09 LAB — DIFFERENTIAL
Basophils Absolute: 0 10*3/uL (ref 0.0–0.1)
Basophils Relative: 0 % (ref 0–1)
Eosinophils Absolute: 0 10*3/uL (ref 0.0–0.7)
Lymphocytes Relative: 45 % (ref 12–46)
Monocytes Absolute: 1.4 10*3/uL — ABNORMAL HIGH (ref 0.1–1.0)
Neutrophils Relative %: 6 % — ABNORMAL LOW (ref 43–77)

## 2010-12-09 LAB — BASIC METABOLIC PANEL
BUN: 14 mg/dL (ref 6–23)
Calcium: 9.2 mg/dL (ref 8.4–10.5)
Creatinine, Ser: 0.83 mg/dL (ref 0.50–1.35)
GFR calc Af Amer: >60 mL/min (ref >60)
GFR calc non Af Amer: >60 mL/min (ref >60)
Glucose, Bld: 91 mg/dL (ref 70–99)

## 2010-12-09 MED ORDER — VANCOMYCIN HCL 1000 MG IV SOLR
750.0000 mg | Freq: Two times a day (BID) | INTRAVENOUS | Status: DC
  Administered 2010-12-09 – 2010-12-10 (×2): 750 mg via INTRAVENOUS
  Filled 2010-12-09 (×4): qty 750

## 2010-12-09 NOTE — Progress Notes (Signed)
CRITICAL VALUE ALERT  Critical value received:    Date of notification:   Time of notification:    Critical value read back:  Nurse who received alert:   MD notified (1st page):   Time of first page:   MD notified (2nd page):  Time of second page:  Responding MD:  Dr. Onalee Hua  Time MD responded:  [phone]

## 2010-12-09 NOTE — Progress Notes (Addendum)
Subjective: Pt feels better than yesterday , denies any cp, sob, diarrhea or any urinary complaints.  Objective: Vital signs in last 24 hours: Filed Vitals:   12/08/10 0557 12/08/10 1421 12/08/10 2231 12/09/10 0500  BP: 87/59 93/57 77/47  109/64  Pulse: 94 103 70 89  Temp: 99.1 F (37.3 C) 98.2 F (36.8 C) 98.2 F (36.8 C) 98.7 F (37.1 C)  TempSrc: Oral Oral Oral Oral  Resp: 16 18 18    Height:      Weight:      SpO2: 89% 95%  96%   Weight change:   Intake/Output Summary (Last 24 hours) at 12/09/10 1101 Last data filed at 12/09/10 0500  Gross per 24 hour  Intake    240 ml  Output    650 ml  Net   -410 ml     General Appearance:    Alert, cooperative, no distress, appears stated age  Lungs:     Clear to auscultation bilaterally, respirations unlabored   Heart:    Regular rate and rhythm, S1 and S2 normal, no murmur, rub   or gallop  Abdomen:     Soft, non-tender, bowel sounds active all four quadrants,    no masses, no organomegaly  Extremities:   Extremities normal, atraumatic, no cyanosis or edema  Pulses:   2+ and symmetric all extremities  Skin:   Skin color, texture, turgor normal, no rashes or lesions    Lab Results: @labtest2 @  Results for orders placed during the hospital encounter of 12/04/10 (from the past 24 hour(s))  BASIC METABOLIC PANEL     Status: Abnormal   Collection Time   12/09/10  5:26 AM      Component Value Range   Sodium 130 (*) 135 - 145 (mEq/L)   Potassium 3.9  3.5 - 5.1 (mEq/L)   Chloride 100  96 - 112 (mEq/L)   CO2 20  19 - 32 (mEq/L)   Glucose, Bld 91  70 - 99 (mg/dL)   BUN 14  6 - 23 (mg/dL)   Creatinine, Ser 1.61  0.50 - 1.35 (mg/dL)   Calcium 9.2  8.4 - 09.6 (mg/dL)   GFR calc non Af Amer >60  >60 (mL/min)   GFR calc Af Amer >60  >60 (mL/min)  CBC     Status: Abnormal   Collection Time   12/09/10  5:26 AM      Component Value Range   WBC 2.9 (*) 4.0 - 10.5 (K/uL)   RBC 3.06 (*) 4.22 - 5.81 (MIL/uL)   Hemoglobin 8.3 (*) 13.0  - 17.0 (g/dL)   HCT 04.5 (*) 40.9 - 52.0 (%)   MCV 77.8 (*) 78.0 - 100.0 (fL)   MCH 27.1  26.0 - 34.0 (pg)   MCHC 34.9  30.0 - 36.0 (g/dL)   RDW 81.1 (*) 91.4 - 15.5 (%)   Platelets 123 (*) 150 - 400 (K/uL)  DIFFERENTIAL     Status: Abnormal   Collection Time   12/09/10  5:26 AM      Component Value Range   Neutrophils Relative 6 (*) 43 - 77 (%)   Lymphocytes Relative 45  12 - 46 (%)   Monocytes Relative 48 (*) 3 - 12 (%)   Eosinophils Relative 1  0 - 5 (%)   Basophils Relative 0  0 - 1 (%)   Neutro Abs 0.2 (*) 1.7 - 7.7 (K/uL)   Lymphs Abs 1.3  0.7 - 4.0 (K/uL)   Monocytes Absolute 1.4 (*) 0.1 -  1.0 (K/uL)   Eosinophils Absolute 0.0  0.0 - 0.7 (K/uL)   Basophils Absolute 0.0  0.0 - 0.1 (K/uL)   RBC Morphology TARGET CELLS     WBC Morphology ATYPICAL LYMPHOCYTES     Smear Review LARGE PLATELETS PRESENT    VANCOMYCIN, TROUGH     Status: Abnormal   Collection Time   12/09/10  5:29 AM      Component Value Range   Vancomycin Tr 26.1 (*) 10.0 - 20.0 (ug/mL)   Micro Results: Recent Results (from the past 240 hour(s))  CULTURE, BLOOD (ROUTINE X 2)     Status: Normal (Preliminary result)   Collection Time   12/07/10  7:22 AM      Component Value Range Status Comment   Specimen Description BLOOD LEFT ARM   Final    Special Requests     Final    Value: BOTTLES DRAWN AEROBIC AND ANAEROBIC 6CC IMMUNE:COMPRM   Culture NO GROWTH 2 DAYS   Final    Report Status PENDING   Incomplete   CULTURE, BLOOD (ROUTINE X 2)     Status: Normal (Preliminary result)   Collection Time   12/07/10  7:25 AM      Component Value Range Status Comment   Specimen Description BLOOD PORTA CATH DRAWN BY RN   Final    Special Requests     Final    Value: BOTTLES DRAWN AEROBIC AND ANAEROBIC 6CC  IMMUNE:COMPRM   Culture NO GROWTH 2 DAYS   Final    Report Status PENDING   Incomplete   URINE CULTURE     Status: Normal   Collection Time   12/07/10  4:10 PM      Component Value Range Status Comment   Specimen  Description URINE, CLEAN CATCH   Final    Special Requests NONE   Final    Setup Time [id number]   Final    Colony Count NO GROWTH   Final    Culture NO GROWTH   Final    Report Status 12/08/2010 FINAL   Final    Studies/Results: No results found. Medications:    Scheduled Meds:   . ceFEPime (MAXIPIME) IV  2 g Intravenous Q8H  . docusate sodium  100 mg Oral BID  . feeding supplement  237 mL Oral BID BM  . fentaNYL  12.5 mcg Transdermal Q72H  . ferumoxytol  1,020 mg Intravenous Once  . filgrastim  300 mcg Subcutaneous Daily  . folic acid  1 mg Oral Daily  . megestrol  200 mg Oral BID  . olmesartan  5 mg Oral Daily  . pantoprazole  40 mg Oral Q1200  . sertraline  50 mg Oral QHS  . sodium chloride      . sucralfate  1 g Oral QID  . vancomycin  750 mg Intravenous Q12H  . DISCONTD: vancomycin  1,000 mg Intravenous Q12H   Continuous Infusions:  PRN Meds:.acetaminophen, alum & mag hydroxide-simeth, HYDROmorphone, HYDROmorphone, LORazepam, ondansetron (ZOFRAN) IV, ondansetron Assessment/Plan: Patient Active Hospital Problem List: Hematemesis : resolved, h & h stable .    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 Heme Onc on board.   Anemia, iron deficiency on iv iron. 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 Platelets are 120's. Still neutropenic. Has been afebrile over the last 24hrs. Will continue to monitor  and d/c antibiotics if afebrile for more than 48hrs or if cultures are negative.   hyponatremia : improving.  dvt propylaxis : no anticoagulants secondary to thrombocytopenia.    LOS: 5 days   [name] 12/09/2010, 11:01 AM

## 2010-12-09 NOTE — Progress Notes (Signed)
CRITICAL VALUE ALERT  Critical value received:  Vancomycin trough: 26.1   Date of notification:  12/09/2010  Time of notification:  0640  Critical value read back:yes  Nurse who received alert:  Milinda Cave, RN  MD notified (1st page): Dr. Onalee Hua  Time of first page:  [phone]  MD notified (2nd page):  Time of second page:  Responding MD:    Time MD responded:

## 2010-12-09 NOTE — Consult Note (Signed)
ANTIBIOTIC CONSULT NOTE   Pharmacy Consult for vancomycin Indication: febrile neutropenia  No Known Allergies  Patient Measurements: Height: 5\' 9"  (175.3 cm) Weight: 116 lb 6.5 oz (52.8 kg) IBW/kg (Calculated) : 70.7   Vital Signs: Temp: 98.7 F (37.1 C) (09/19 0500) Temp src: Oral (09/19 0500) BP: 109/64 mmHg (09/19 0500) Pulse Rate: 89  (09/19 0500) Intake/Output from previous day: 09/18 0701 - 09/19 0700 In: 360 [P.O.:360] Out: 650 [Urine:650] Intake/Output from this shift:   Labs:  Basename 12/09/10 0526 12/08/10 0610 12/07/10 0516  WBC 2.9* 1.8* 1.0*  HGB 8.3* 8.1* 8.9*  PLT PENDING 69* 67*  LABCREA -- -- --  CREATININE 0.83 0.79 0.87  CRCLEARANCE -- -- --    Basename 12/09/10 0529  VANCOTROUGH 26.1*  VANCOPEAK --  [name] --  GENTTROUGH --  GENTPEAK --  GENTRANDOM --  TOBRATROUGH --  [name] --  TOBRARND --  AMIKACINPEAK --  AMIKACINTROU --  AMIKACIN --    Microbiology: Recent Results (from the past 720 hour(s))  CULTURE, BLOOD (ROUTINE X 2)     Status: Normal (Preliminary result)   Collection Time   12/07/10  7:22 AM      Component Value Range Status Comment   Specimen Description BLOOD LEFT ARM   Final    Special Requests     Final    Value: BOTTLES DRAWN AEROBIC AND ANAEROBIC 6CC IMMUNE:COMPRM   Culture NO GROWTH 1 DAY   Final    Report Status PENDING   Incomplete   CULTURE, BLOOD (ROUTINE X 2)     Status: Normal (Preliminary result)   Collection Time   12/07/10  7:25 AM      Component Value Range Status Comment   Specimen Description BLOOD PORTA CATH DRAWN BY RN   Final    Special Requests     Final    Value: BOTTLES DRAWN AEROBIC AND ANAEROBIC 6CC  IMMUNE:COMPRM   Culture NO GROWTH 1 DAY   Final    Report Status PENDING   Incomplete   URINE CULTURE     Status: Normal   Collection Time   12/07/10  4:10 PM      Component Value Range Status Comment   Specimen Description URINE, CLEAN CATCH   Final    Special Requests NONE   Final      Setup Time [id number]   Final    Colony Count NO GROWTH   Final    Culture NO GROWTH   Final    Report Status 12/08/2010 FINAL   Final    Medical History: Past Medical History  Diagnosis Date  . 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 Pulmonary embolism 02/2010  . Acid reflux   . Hypertension   . Osteoporosis   . Arthritis   . TIA (transient ischemic attack) 10/11  . ETOH abuse     quit 03/2010  . 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 Personal history of PE (pulmonary embolism) 10/01/2010  . Blood transfusion   . S/P endoscopy September 28, 2010    erosive reflux esophagitis, [facility] I anatomy   Medications:  Scheduled:     . ceFEPime (MAXIPIME) IV  2 g Intravenous Q8H  . docusate sodium  100 mg Oral BID  . feeding supplement  237 mL Oral BID BM  . fentaNYL  12.5 mcg Transdermal Q72H  . ferumoxytol  1,020 mg Intravenous Once  . filgrastim  300 mcg Subcutaneous Daily  .  folic acid  1 mg Oral Daily  . megestrol  200 mg Oral BID  . olmesartan  5 mg Oral Daily  . pantoprazole  40 mg Oral Q1200  . sertraline  50 mg Oral QHS  . sodium chloride      . sucralfate  1 g Oral QID  . vancomycin  750 mg Intravenous Q12H  . DISCONTD: vancomycin  1,000 mg Intravenous Q12H   Assessment: Febrile neutropenia Renal fxn OK Also on Cefepime Vancomycin trough is elevated above goal  Goal of Therapy:  Vancomycin trough level 15-20 mcg/ml  Plan: Reduce Vancomycin to 750mg  iv q12hrs f/u trough at steady state Monitor renal fxn per protocol  [name] A 12/09/2010,8:03 AM

## 2010-12-09 NOTE — Progress Notes (Signed)
Subjective:  The patient reports that he is feeling better than yesterday.  He denies any fevers, chills, night sweats.  He reports that his bowels are operating appropriately.  He denies any chest pain, or SOB.  The patient received his Feraheme 1020 mg IV without any complications.    Objective: Vital signs in last 24 hours: Temp:  [98.2 F (36.8 C)-98.7 F (37.1 C)] 98.7 F (37.1 C) (09/19 0500) Pulse Rate:  [70-103] 89  (09/19 0500) Resp:  [18] 18  (09/18 2231) BP: (77-109)/(47-64) 109/64 mmHg (09/19 0500) SpO2:  [95 %-96 %] 96 % (09/19 0500)  Intake/Output from previous day: 09/18 0701 - 09/19 0700 In: 360 [P.O.:360] Out: 650 [Urine:650] Intake/Output this shift:    General appearance: alert, cachectic and no distress Resp: clear to auscultation bilaterally Cardio: regular rate and rhythm, S1, S2 normal, no murmur, click, rub or gallop GI: soft, non-tender; bowel sounds normal; no masses,  no organomegaly  Lab Results:  CBC    Component Value Date/Time   WBC 2.9* 12/09/2010 0526   RBC 3.06* 12/09/2010 0526   HGB 8.3* 12/09/2010 0526   HCT 23.8* 12/09/2010 0526   PLT 123* 12/09/2010 0526   MCV 77.8* 12/09/2010 0526   MCH 27.1 12/09/2010 0526   MCHC 34.9 12/09/2010 0526   RDW 18.5* 12/09/2010 0526   LYMPHSABS 1.3 12/09/2010 0526   MONOABS 1.4* 12/09/2010 0526   EOSABS 0.0 12/09/2010 0526   BASOSABS 0.0 12/09/2010 0526    BMET  Basename 12/09/10 0526 12/08/10 0610  NA 130* 129*  K 3.9 3.7  CL 100 100  CO2 20 19  GLUCOSE 91 101*  BUN 14 16  CREATININE 0.83 0.79  CALCIUM 9.2 8.4    Studies/Results: Dg Chest 2 View  12/07/2010  *RADIOLOGY REPORT*  Clinical Data: Shortness of breath, immunocompromised  CHEST - 2 VIEW  Comparison: 09/29/2010  Findings: Normal heart size and pulmonary vascularity. Tortuous aorta. Mild eventration left diaphragm. Emphysematous changes without infiltrate or effusion. No pneumothorax. Bones demineralized. Perigastric surgical clips. Left  subclavian Port-A-Cath, tip SVC.  IMPRESSION: Emphysematous changes without acute infiltrate.  Original Report Authenticated By: Lollie Marrow, M.D.    Medications: I have reviewed the patient's current medications.  Assessment/Plan: 1. Neutropenia, on IV antibiotics and neutropenic precautions.  Also receiving Neupogen Subq. 2. Anemia 3. Iron deficiency anemia, received Feraheme 1020 mg IV  4. Thrombocytopenia, improving 5. Pancytopenia. 6. Will continue to follow and assist in patient care as needed.   LOS: 5 days    [name] 12/09/2010

## 2010-12-09 NOTE — Progress Notes (Signed)
[facility] Chart Review Completed  

## 2010-12-10 DIAGNOSIS — C189 Malignant neoplasm of colon, unspecified: Secondary | ICD-10-CM

## 2010-12-10 LAB — BASIC METABOLIC PANEL
BUN: 9 mg/dL (ref 6–23)
Chloride: 103 mEq/L (ref 96–112)
Creatinine, Ser: 0.69 mg/dL (ref 0.50–1.35)
GFR calc Af Amer: >60 mL/min (ref >60)
GFR calc non Af Amer: >60 mL/min (ref >60)
Glucose, Bld: 84 mg/dL (ref 70–99)
Potassium: 4 mEq/L (ref 3.5–5.1)

## 2010-12-10 LAB — DIFFERENTIAL
Eosinophils Absolute: 0.3 10*3/uL (ref 0.0–0.7)
Eosinophils Relative: 2 % (ref 0–5)
Metamyelocytes Relative: 4 %
Myelocytes: 2 %
nRBC: 0 /100 WBC

## 2010-12-10 LAB — TYPE AND SCREEN
Antibody Screen: NEGATIVE
Unit division: 0

## 2010-12-10 LAB — CBC
HCT: 23.4 % — ABNORMAL LOW (ref 39.0–52.0)
Hemoglobin: 8.3 g/dL — ABNORMAL LOW (ref 13.0–17.0)
MCH: 27.6 pg (ref 26.0–34.0)
MCHC: 35.5 g/dL (ref 30.0–36.0)
RDW: 18.7 % — ABNORMAL HIGH (ref 11.5–15.5)

## 2010-12-10 MED ORDER — SODIUM CHLORIDE 0.9 % IJ SOLN
INTRAMUSCULAR | Status: AC
  Administered 2010-12-10: 10 mL
  Filled 2010-12-10: qty 10

## 2010-12-10 NOTE — Progress Notes (Signed)
Subjective:  Patient denies any chest pain shortness of breath, his abdominal pain is the same controlled  with pain medication  Objective: Vital signs in last 24 hours: Filed Vitals:   12/09/10 0500 12/09/10 1433 12/09/10 2130 12/10/10 0629  BP: 109/64 93/54 94/53  99/44  Pulse: 89 80 82 87  Temp: 98.7 F (37.1 C) 97.8 F (36.6 C) 98.7 F (37.1 C) 98.5 F (36.9 C)  TempSrc: Oral  Oral   Resp:  18 12 16   Height:      Weight:      SpO2: 96% 96% 98% 96%   Weight change:   Intake/Output Summary (Last 24 hours) at 12/10/10 1050 Last data filed at 12/10/10 0515  Gross per 24 hour  Intake    490 ml  Output    825 ml  Net   -335 ml     General Appearance:    Alert, cooperative, no distress, appears stated age  Lungs:     Clear to auscultation bilaterally, respirations unlabored   Heart:    Regular rate and rhythm, S1 and S2 normal, no murmur, rub   or gallop  Abdomen:     Soft, non-tender, bowel sounds active all four quadrants,    no masses, no organomegaly  Extremities:   Extremities normal, atraumatic, no cyanosis or edema  Pulses:   2+ and symmetric all extremities    Lab Results: Results for orders placed during the hospital encounter of 12/04/10 (from the past 24 hour(s))  BASIC METABOLIC PANEL     Status: Abnormal   Collection Time   12/10/10  5:16 AM      Component Value Range   Sodium 131 (*) 135 - 145 (mEq/L)   Potassium 4.0  3.5 - 5.1 (mEq/L)   Chloride 103  96 - 112 (mEq/L)   CO2 21  19 - 32 (mEq/L)   Glucose, Bld 84  70 - 99 (mg/dL)   BUN 9  6 - 23 (mg/dL)   Creatinine, Ser 8.11  0.50 - 1.35 (mg/dL)   Calcium 9.3  8.4 - 91.4 (mg/dL)   GFR calc non Af Amer >60  >60 (mL/min)   GFR calc Af Amer >60  >60 (mL/min)  CBC     Status: Abnormal   Collection Time   12/10/10  5:16 AM      Component Value Range   WBC 13.6 (*) 4.0 - 10.5 (K/uL)   RBC 3.01 (*) 4.22 - 5.81 (MIL/uL)   Hemoglobin 8.3 (*) 13.0 - 17.0 (g/dL)   HCT 78.2 (*) 95.6 - 52.0 (%)   MCV 77.7 (*)  78.0 - 100.0 (fL)   MCH 27.6  26.0 - 34.0 (pg)   MCHC 35.5  30.0 - 36.0 (g/dL)   RDW 21.3 (*) 08.6 - 15.5 (%)   Platelets 164  150 - 400 (K/uL)  DIFFERENTIAL     Status: Abnormal   Collection Time   12/10/10  5:16 AM      Component Value Range   Neutrophils Relative 3 (*) 43 - 77 (%)   Lymphocytes Relative 51 (*) 12 - 46 (%)   Monocytes Relative 12  3 - 12 (%)   Eosinophils Relative 2  0 - 5 (%)   Basophils Relative 0  0 - 1 (%)   Band Neutrophils 26 (*) 0 - 10 (%)   Metamyelocytes Relative 4     Myelocytes 2     Promyelocytes Absolute 0     Blasts 0  nRBC 0  0 (/100 WBC)   Neutro Abs 4.8  1.7 - 7.7 (K/uL)   Lymphs Abs 6.9 (*) 0.7 - 4.0 (K/uL)   Monocytes Absolute 1.6 (*) 0.1 - 1.0 (K/uL)   Eosinophils Absolute 0.3  0.0 - 0.7 (K/uL)   Basophils Absolute 0.0  0.0 - 0.1 (K/uL)   RBC Morphology STOMATOCYTES     WBC Morphology       Value: MODERATE LEFT SHIFT (>5% METAS AND MYELOS,OCC PRO NOTED)   Smear Review LARGE PLATELETS PRESENT     Micro Results: Recent Results (from the past 240 hour(s))  CULTURE, BLOOD (ROUTINE X 2)     Status: Normal (Preliminary result)   Collection Time   12/07/10  7:22 AM      Component Value Range Status Comment   Specimen Description BLOOD LEFT ARM   Final    Special Requests     Final    Value: BOTTLES DRAWN AEROBIC AND ANAEROBIC 6CC IMMUNE:COMPRM   Culture NO GROWTH 2 DAYS   Final    Report Status PENDING   Incomplete   CULTURE, BLOOD (ROUTINE X 2)     Status: Normal (Preliminary result)   Collection Time   12/07/10  7:25 AM      Component Value Range Status Comment   Specimen Description BLOOD PORTA CATH DRAWN BY RN   Final    Special Requests     Final    Value: BOTTLES DRAWN AEROBIC AND ANAEROBIC 6CC  IMMUNE:COMPRM   Culture NO GROWTH 2 DAYS   Final    Report Status PENDING   Incomplete   URINE CULTURE     Status: Normal   Collection Time   12/07/10  4:10 PM      Component Value Range Status Comment   Specimen Description URINE,  CLEAN CATCH   Final    Special Requests NONE   Final    Setup Time [id number]   Final    Colony Count NO GROWTH   Final    Culture NO GROWTH   Final    Report Status 12/08/2010 FINAL   Final    Studies/Results: No results found. Medications: I have reviewed the patient's current medications. Scheduled Meds:   . docusate sodium  100 mg Oral BID  . feeding supplement  237 mL Oral BID BM  . fentaNYL  12.5 mcg Transdermal Q72H  . folic acid  1 mg Oral Daily  . megestrol  200 mg Oral BID  . pantoprazole  40 mg Oral Q1200  . sertraline  50 mg Oral QHS  . sucralfate  1 g Oral QID  . DISCONTD: ceFEPime (MAXIPIME) IV  2 g Intravenous Q8H  . DISCONTD: filgrastim  300 mcg Subcutaneous Daily  . DISCONTD: olmesartan  5 mg Oral Daily  . DISCONTD: vancomycin  750 mg Intravenous Q12H   Continuous Infusions:  PRN Meds:.acetaminophen, alum & mag hydroxide-simeth, HYDROmorphone, LORazepam, ondansetron (ZOFRAN) IV, ondansetron, DISCONTD: HYDROmorphone Assessment/Plan: Patient Active Hospital Problem List: Hematemesis (12/05/2010)  hematemesis resolved. Next H&H has been stable over the last 48 hours.  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 Oncology follow up requested.  Anemia, chronic disease (09/26/2010)  stable  Erosive esophagitis (10/01/2010)   On ppi and sucralfate Esophageal reflux disease (11/15/2010)  On ppi and sucralfate Chronic abdominal pain (12/05/2010) At baseline Pancytopenia due to antineoplastic chemotherapy (12/05/2010)   neutropenia resolved the platelets are normal hemoglobin is at baseline will hold the  Neupogen. Stop the  antibiotics monitor pt for fever off antibiotics for 24 hours and plan for discharge tomorrow. So far all his cultures are negative.   Borderline hypotension: Patient was on Benicar a few days ago for his elevated blood pressure, at this time patient's blood pressure is running  Low,  he does not  have a history of hypertension, hence will  stop the antihypertensive and monitor his BP.    LOS: 6 days   [name] 12/10/2010, 10:50 AM

## 2010-12-10 NOTE — Consult Note (Signed)
ANTIBIOTIC CONSULT NOTE   Pharmacy Consult for vancomycin Indication: febrile neutropenia  No Known Allergies  Patient Measurements: Height: 5\' 9"  (175.3 cm) Weight: 116 lb 6.5 oz (52.8 kg) IBW/kg (Calculated) : 70.7   Vital Signs: Temp: 98.5 F (36.9 C) (09/20 0629) Temp src: Oral (09/19 2130) BP: 99/44 mmHg (09/20 0629) Pulse Rate: 87  (09/20 0629) Intake/Output from previous day: 09/19 0701 - 09/20 0700 In: 490 [P.O.:240; IV Piggyback:250] Out: 825 [Urine:825] Intake/Output from this shift:   Labs:  Basename 12/10/10 0516 12/09/10 0526 12/08/10 0610  WBC 13.6* 2.9* 1.8*  HGB 8.3* 8.3* 8.1*  PLT 164 123* 69*  LABCREA -- -- --  CREATININE 0.69 0.83 0.79  CRCLEARANCE -- -- --    Basename 12/09/10 0529  VANCOTROUGH 26.1*  VANCOPEAK --  [name] --  GENTTROUGH --  GENTPEAK --  GENTRANDOM --  TOBRATROUGH --  [name] --  TOBRARND --  AMIKACINPEAK --  AMIKACINTROU --  AMIKACIN --    Microbiology: Recent Results (from the past 720 hour(s))  CULTURE, BLOOD (ROUTINE X 2)     Status: Normal (Preliminary result)   Collection Time   12/07/10  7:22 AM      Component Value Range Status Comment   Specimen Description BLOOD LEFT ARM   Final    Special Requests     Final    Value: BOTTLES DRAWN AEROBIC AND ANAEROBIC 6CC IMMUNE:COMPRM   Culture NO GROWTH 2 DAYS   Final    Report Status PENDING   Incomplete   CULTURE, BLOOD (ROUTINE X 2)     Status: Normal (Preliminary result)   Collection Time   12/07/10  7:25 AM      Component Value Range Status Comment   Specimen Description BLOOD PORTA CATH DRAWN BY RN   Final    Special Requests     Final    Value: BOTTLES DRAWN AEROBIC AND ANAEROBIC 6CC  IMMUNE:COMPRM   Culture NO GROWTH 2 DAYS   Final    Report Status PENDING   Incomplete   URINE CULTURE     Status: Normal   Collection Time   12/07/10  4:10 PM      Component Value Range Status Comment   Specimen Description URINE, CLEAN CATCH   Final    Special  Requests NONE   Final    Setup Time [id number]   Final    Colony Count NO GROWTH   Final    Culture NO GROWTH   Final    Report Status 12/08/2010 FINAL   Final    Medical History: Past Medical History  Diagnosis Date  . 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 Pulmonary embolism 02/2010  . Acid reflux   . Hypertension   . Osteoporosis   . Arthritis   . TIA (transient ischemic attack) 10/11  . ETOH abuse     quit 03/2010  . 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 Personal history of PE (pulmonary embolism) 10/01/2010  . Blood transfusion   . S/P endoscopy September 28, 2010    erosive reflux esophagitis, Billroth I anatomy   Medications:  Scheduled:     . ceFEPime (MAXIPIME) IV  2 g Intravenous Q8H  . docusate sodium  100 mg Oral BID  . feeding supplement  237 mL Oral BID BM  . fentaNYL  12.5 mcg Transdermal Q72H  . filgrastim  300 mcg Subcutaneous Daily  . folic acid  1 mg Oral Daily  .  megestrol  200 mg Oral BID  . olmesartan  5 mg Oral Daily  . pantoprazole  40 mg Oral Q1200  . sertraline  50 mg Oral QHS  . sucralfate  1 g Oral QID  . vancomycin  750 mg Intravenous Q12H   Assessment: Febrile neutropenia Renal fxn OK Also on Cefepime Vancomycin trough is elevated above goal  Goal of Therapy:  Vancomycin trough level 15-20 mcg/ml  Plan: Reduce Vancomycin to 750mg  iv q12hrs f/u trough Friday am Monitor renal fxn per protocol  [name] A 12/10/2010,8:47 AM

## 2010-12-10 NOTE — Progress Notes (Signed)
Subjective: The patient reports that he continues not to feel well.  The patient is unable to tell me exactly what feels poor.  His abdominal pain is well controlled.    His Hgb is stable, and his ANC has risen nicely.    The patient continues to have early satiety.  He denies any bleeding issues, nausea, vomiting, fevers, chills, night sweats. Objective: Vital signs in last 24 hours: Temp:  [97.8 F (36.6 C)-98.7 F (37.1 C)] 98.5 F (36.9 C) (09/20 0629) Pulse Rate:  [80-87] 87  (09/20 0629) Resp:  [12-18] 16  (09/20 0629) BP: (93-99)/(44-54) 99/44 mmHg (09/20 0629) SpO2:  [96 %-98 %] 96 % (09/20 0629)  Intake/Output from previous day: 09/19 0701 - 09/20 0700 In: 490 [P.O.:240; IV Piggyback:250] Out: 825 [Urine:825] Intake/Output this shift:    General appearance: alert, cooperative, cachectic and no distress Resp: clear to auscultation bilaterally Cardio: regular rate and rhythm, S1, S2 normal, no murmur, click, rub or gallop GI: soft, non-tender; bowel sounds normal; no masses,  no organomegaly Extremities: extremities normal, atraumatic, no cyanosis or edema  Lab Results:  CBC    Component Value Date/Time   WBC 13.6* 12/10/2010 0516   RBC 3.01* 12/10/2010 0516   HGB 8.3* 12/10/2010 0516   HCT 23.4* 12/10/2010 0516   PLT 164 12/10/2010 0516   MCV 77.7* 12/10/2010 0516   MCH 27.6 12/10/2010 0516   MCHC 35.5 12/10/2010 0516   RDW 18.7* 12/10/2010 0516   LYMPHSABS 6.9* 12/10/2010 0516   MONOABS 1.6* 12/10/2010 0516   EOSABS 0.3 12/10/2010 0516   BASOSABS 0.0 12/10/2010 0516    BMET  Basename 12/10/10 0516 12/09/10 0526  NA 131* 130*  K 4.0 3.9  CL 103 100  CO2 21 20  GLUCOSE 84 91  BUN 9 14  CREATININE 0.69 0.83  CALCIUM 9.3 9.2    Studies/Results: No results found.  Medications: I have reviewed the patient's current medications.  Assessment/Plan: 1. Neutropenia, resolved.  D/C Neutropenic precautions 2. Anemia, stable at 8.3 3. Thrombocytopenia,  resolved. 4. Weight loss, on Megace  5. Stage III Colon Cancer, S/P 6 cycles of FOLFOX chemotherapy.     LOS: 6 days    Navon Kotowski 12/10/2010

## 2010-12-11 ENCOUNTER — Inpatient Hospital Stay (HOSPITAL_COMMUNITY): Payer: Federal, State, Local not specified - PPO

## 2010-12-11 DIAGNOSIS — J189 Pneumonia, unspecified organism: Secondary | ICD-10-CM | POA: Diagnosis not present

## 2010-12-11 DIAGNOSIS — C189 Malignant neoplasm of colon, unspecified: Secondary | ICD-10-CM

## 2010-12-11 LAB — BASIC METABOLIC PANEL
Calcium: 9 mg/dL (ref 8.4–10.5)
Chloride: 101 mEq/L (ref 96–112)
Creatinine, Ser: 0.8 mg/dL (ref 0.50–1.35)
GFR calc Af Amer: >60 mL/min (ref >60)
GFR calc non Af Amer: >60 mL/min (ref >60)

## 2010-12-11 LAB — DIFFERENTIAL
Basophils Absolute: 0 10*3/uL (ref 0.0–0.1)
Eosinophils Absolute: 0 10*3/uL (ref 0.0–0.7)
Lymphocytes Relative: 36 % (ref 12–46)
Monocytes Relative: 7 % (ref 3–12)
Neutrophils Relative %: 11 % — ABNORMAL LOW (ref 43–77)

## 2010-12-11 LAB — CBC
HCT: 24.3 % — ABNORMAL LOW (ref 39.0–52.0)
Platelets: 213 10*3/uL (ref 150–400)
RDW: 19.2 % — ABNORMAL HIGH (ref 11.5–15.5)
WBC: 41.3 10*3/uL — ABNORMAL HIGH (ref 4.0–10.5)

## 2010-12-11 LAB — URINALYSIS, ROUTINE W REFLEX MICROSCOPIC
Nitrite: NEGATIVE
Protein, ur: NEGATIVE mg/dL
Specific Gravity, Urine: 1.025 (ref 1.005–1.030)
Urobilinogen, UA: 0.2 mg/dL (ref 0.0–1.0)

## 2010-12-11 LAB — URINE MICROSCOPIC-ADD ON

## 2010-12-11 MED ORDER — VANCOMYCIN HCL 1000 MG IV SOLR
750.0000 mg | Freq: Two times a day (BID) | INTRAVENOUS | Status: DC
  Administered 2010-12-11 – 2010-12-14 (×7): 750 mg via INTRAVENOUS
  Filled 2010-12-11 (×8): qty 750

## 2010-12-11 MED ORDER — SODIUM CHLORIDE 0.9 % IJ SOLN
INTRAMUSCULAR | Status: AC
  Administered 2010-12-11: 10 mL
  Filled 2010-12-11: qty 10

## 2010-12-11 MED ORDER — VANCOMYCIN HCL IN DEXTROSE 1-5 GM/200ML-% IV SOLN
1000.0000 mg | INTRAVENOUS | Status: DC
  Filled 2010-12-11 (×3): qty 200

## 2010-12-11 MED ORDER — PIPERACILLIN-TAZOBACTAM 3.375 G IVPB
3.3750 g | Freq: Three times a day (TID) | INTRAVENOUS | Status: DC
  Administered 2010-12-11 – 2010-12-14 (×10): 3.375 g via INTRAVENOUS
  Filled 2010-12-11 (×13): qty 50

## 2010-12-11 NOTE — Progress Notes (Signed)
Subjective: The patient reports that he spiked a fever of 100.4 last night.  He reports that he simply feels "down" and "not well."  He doesn't quite elaborate more than that.  We had a discussion about continuation of chemotherapy.  The patient admits to abdominal discomfort that is well controlled on pain medications.  Objective: Vital signs in last 24 hours: Temp:  [98.7 F (37.1 C)-100.4 F (38 C)] 98.7 F (37.1 C) (09/21 0600) Pulse Rate:  [85-96] 85  (09/21 0600) Resp:  [18-22] 22  (09/21 0600) BP: (93-121)/(63-75) 93/64 mmHg (09/21 0600) SpO2:  [94 %-96 %] 96 % (09/21 0600)  Intake/Output from previous day: 09/20 0800 - 09/21 0759 In: 250 [P.O.:240; I.V.:10] Out: 500 [Urine:500] Intake/Output this shift: Total I/O In: -  Out: 175 [Urine:175]  General appearance: alert, cooperative, cachectic and no distress Resp: clear to auscultation bilaterally Cardio: regular rate and rhythm, S1, S2 normal, no murmur, click, rub or gallop GI: soft, non-tender; bowel sounds normal; no masses,  no organomegaly Extremities: extremities normal, atraumatic, no cyanosis or edema  Lab Results:  @LABLAST2 (wbc:2,hgb:2,hct:2,plt:2) BMET  Basename 12/11/10 0519 12/10/10 0516  NA 132* 131*  K 3.9 4.0  CL 101 103  CO2 21 21  GLUCOSE 95 84  BUN 9 9  CREATININE 0.80 0.69  CALCIUM 9.0 9.3    Studies/Results: Dg Chest 2 View  12/11/2010  *RADIOLOGY REPORT*  Clinical Data: Fever  CHEST - 2 VIEW  Comparison: 12/07/2010  Findings: Left subclavian power port, tip SVC. Normal heart size, mediastinal contours, and pulmonary vascularity. Emphysematous and minimal bronchitic changes with atelectasis versus consolidation in medial left lower lobe. Remaining lungs clear. No pleural effusion or pneumothorax. Bones demineralized.  IMPRESSION: Emphysematous and minimal bronchitic changes. Atelectasis versus consolidation at medial left lower lobe.  Original Report Authenticated By: Lollie Marrow, M.D.      Medications: I have reviewed the patient's current medications.  Assessment/Plan: 1. Colon Cancer, S/P 6 cycles of FOLFOX chemotherapy.  S/P resection 2. Pancytopenia, resolved 3. Anemia 4. Iron deficiency, S/P Feraheme 1020mg  infusion  5. Weight loss, on Megace   LOS: 7 days    KEFALAS,THOMAS 12/11/2010

## 2010-12-11 NOTE — Consult Note (Signed)
ANTIBIOTIC CONSULT NOTE   Pharmacy Consult for vancomycin and zosyn Indication:R/o pna No Known Allergies  Patient Measurements: Height: 5\' 9"  (175.3 cm) Weight: 116 lb 6.5 oz (52.8 kg) IBW/kg (Calculated) : 70.7   Vital Signs: Temp: 98.7 F (37.1 C) (09/21 0600) Temp src: Oral (09/21 0600) BP: 93/64 mmHg (09/21 0600) Pulse Rate: 85  (09/21 0600) Intake/Output from previous day: 09/20 0701 - 09/21 0700 In: 250 [P.O.:240; I.V.:10] Out: 500 [Urine:500] Intake/Output from this shift: Total I/O In: -  Out: 175 [Urine:175] Labs:  [facility] 12/11/10 0519 12/10/10 0516 12/09/10 0526  WBC 41.3* 13.6* 2.9*  HGB 8.4* 8.3* 8.3*  PLT 213 164 123*  LABCREA -- -- --  CREATININE 0.80 0.69 0.83  CRCLEARANCE -- -- --    Basename 12/09/10 0529  VANCOTROUGH 26.1*  VANCOPEAK --  [name] --  GENTTROUGH --  GENTPEAK --  GENTRANDOM --  TOBRATROUGH --  TOBRAPEAK --  TOBRARND --  AMIKACINPEAK --  AMIKACINTROU --  AMIKACIN --    Microbiology: Recent Results (from the past 720 hour(s))  CULTURE, BLOOD (ROUTINE X 2)     Status: Normal (Preliminary result)   Collection Time   12/07/10  7:22 AM      Component Value Range Status Comment   Specimen Description BLOOD LEFT ARM   Final    Special Requests     Final    Value: BOTTLES DRAWN AEROBIC AND ANAEROBIC 6CC IMMUNE:COMPRM   Culture NO GROWTH 4 DAYS   Final    Report Status PENDING   Incomplete   CULTURE, BLOOD (ROUTINE X 2)     Status: Normal (Preliminary result)   Collection Time   12/07/10  7:25 AM      Component Value Range Status Comment   Specimen Description BLOOD PORTA CATH DRAWN BY RN   Final    Special Requests     Final    Value: BOTTLES DRAWN AEROBIC AND ANAEROBIC 6CC  IMMUNE:COMPRM   Culture NO GROWTH 4 DAYS   Final    Report Status PENDING   Incomplete   URINE CULTURE     Status: Normal   Collection Time   12/07/10  4:10 PM      Component Value Range Status Comment   Specimen Description URINE, CLEAN CATCH    Final    Special Requests NONE   Final    Setup Time [id number]   Final    Colony Count NO GROWTH   Final    Culture NO GROWTH   Final    Report Status 12/08/2010 FINAL   Final    Medical History: Past Medical History  Diagnosis Date  . 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 Pulmonary embolism 02/2010  . Acid reflux   . Hypertension   . Osteoporosis   . Arthritis   . TIA (transient ischemic attack) 10/11  . ETOH abuse     quit 03/2010  . 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 Personal history of PE (pulmonary embolism) 10/01/2010  . Blood transfusion   . S/P endoscopy September 28, 2010    erosive reflux esophagitis, Billroth I anatomy   Medications:  Scheduled:     . docusate sodium  100 mg Oral BID  . feeding supplement  237 mL Oral BID BM  . fentaNYL  12.5 mcg Transdermal Q72H  . folic acid  1 mg Oral Daily  . megestrol  200 mg Oral BID  . pantoprazole  40  mg Oral Q1200  . piperacillin-tazobactam (ZOSYN)  IV  3.375 g Intravenous Q8H  . sertraline  50 mg Oral QHS  . sodium chloride      . sodium chloride      . sucralfate  1 g Oral QID  . vancomycin  1,000 mg Intravenous Q24H   Assessment:  Renal fxn OK Was receiving Vancomycin 1000 mg IV q12hrs resulting on a Vancomycin trough elevated above goal  Goal of Therapy:  Vancomycin trough level 15-20 mcg/ml  Plan: Vancomycin 750mg  IV q12hrs. f/u trough Saturday. Monitor renal fxn per protocol  Caryl Asp 12/11/2010,2:00 PM

## 2010-12-11 NOTE — Progress Notes (Signed)
Subjective: Not feeling good. Denies any cp, sob. Febrile overnight. cxr showed possible consolidation vs atelectasis.  Objective: Vital signs in last 24 hours: Filed Vitals:   12/10/10 0930 12/10/10 1456 12/10/10 2126 12/11/10 0600  BP: 96/62 97/63 121/75 93/64  Pulse: 80 92 96 85  Temp:  99.3 F (37.4 C) 100.4 F (38 C) 98.7 F (37.1 C)  TempSrc:   Oral Oral  Resp:  18 20 22   Height:      Weight:      SpO2:  95% 94% 96%   Weight change:   Intake/Output Summary (Last 24 hours) at 12/11/10 1336 Last data filed at 12/11/10 1200  Gross per 24 hour  Intake    250 ml  Output    675 ml  Net   -425 ml     General Appearance:    Alert, cooperative, no distress, appears stated age  Lungs:     Clear to auscultation bilaterally, respirations unlabored   Heart:    Regular rate and rhythm, S1 and S2 normal,  Abdomen:     Soft, non-tender, bowel sounds active all four quadrants,    no masses, no organomegaly  Extremities:   Extremities normal, atraumatic, no cyanosis or edema  Pulses:   2+ and symmetric all extremities           Lab Results: Results for orders placed during the hospital encounter of 12/04/10 (from the past 24 hour(s))  BASIC METABOLIC PANEL     Status: Abnormal   Collection Time   12/11/10  5:19 AM      Component Value Range   Sodium 132 (*) 135 - 145 (mEq/L)   Potassium 3.9  3.5 - 5.1 (mEq/L)   Chloride 101  96 - 112 (mEq/L)   CO2 21  19 - 32 (mEq/L)   Glucose, Bld 95  70 - 99 (mg/dL)   BUN 9  6 - 23 (mg/dL)   Creatinine, Ser 1.61  0.50 - 1.35 (mg/dL)   Calcium 9.0  8.4 - 09.6 (mg/dL)   GFR calc non Af Amer >60  >60 (mL/min)   GFR calc Af Amer >60  >60 (mL/min)  CBC     Status: Abnormal   Collection Time   12/11/10  5:19 AM      Component Value Range   WBC 41.3 (*) 4.0 - 10.5 (K/uL)   RBC 3.11 (*) 4.22 - 5.81 (MIL/uL)   Hemoglobin 8.4 (*) 13.0 - 17.0 (g/dL)   HCT 04.5 (*) 40.9 - 52.0 (%)   MCV 78.1  78.0 - 100.0 (fL)   MCH 27.0  26.0 - 34.0 (pg)     MCHC 34.6  30.0 - 36.0 (g/dL)   RDW 81.1 (*) 91.4 - 15.5 (%)   Platelets 213  150 - 400 (K/uL)  DIFFERENTIAL     Status: Abnormal   Collection Time   12/11/10  5:19 AM      Component Value Range   Neutrophils Relative 11 (*) 43 - 77 (%)   Lymphocytes Relative 36  12 - 46 (%)   Monocytes Relative 7  3 - 12 (%)   Eosinophils Relative 0  0 - 5 (%)   Basophils Relative 0  0 - 1 (%)   Band Neutrophils 46 (*) 0 - 10 (%)   Neutro Abs 23.5 (*) 1.7 - 7.7 (K/uL)   Lymphs Abs 14.9 (*) 0.7 - 4.0 (K/uL)   Monocytes Absolute 2.9 (*) 0.1 - 1.0 (K/uL)   Eosinophils Absolute  0.0  0.0 - 0.7 (K/uL)   Basophils Absolute 0.0  0.0 - 0.1 (K/uL)   Micro Results: Recent Results (from the past 240 hour(s))  CULTURE, BLOOD (ROUTINE X 2)     Status: Normal (Preliminary result)   Collection Time   12/07/10  7:22 AM      Component Value Range Status Comment   Specimen Description BLOOD LEFT ARM   Final    Special Requests     Final    Value: BOTTLES DRAWN AEROBIC AND ANAEROBIC 6CC IMMUNE:COMPRM   Culture NO GROWTH 4 DAYS   Final    Report Status PENDING   Incomplete   CULTURE, BLOOD (ROUTINE X 2)     Status: Normal (Preliminary result)   Collection Time   12/07/10  7:25 AM      Component Value Range Status Comment   Specimen Description BLOOD PORTA CATH DRAWN BY RN   Final    Special Requests     Final    Value: BOTTLES DRAWN AEROBIC AND ANAEROBIC 6CC  IMMUNE:COMPRM   Culture NO GROWTH 4 DAYS   Final    Report Status PENDING   Incomplete   URINE CULTURE     Status: Normal   Collection Time   12/07/10  4:10 PM      Component Value Range Status Comment   Specimen Description URINE, CLEAN CATCH   Final    Special Requests NONE   Final    Setup Time [id number]   Final    Colony Count NO GROWTH   Final    Culture NO GROWTH   Final    Report Status 12/08/2010 FINAL   Final    Studies/Results: Dg Chest 2 View  12/11/2010  *RADIOLOGY REPORT*  Clinical Data: Fever  CHEST - 2 VIEW  Comparison:  12/07/2010  Findings: Left subclavian power port, tip SVC. Normal heart size, mediastinal contours, and pulmonary vascularity. Emphysematous and minimal bronchitic changes with atelectasis versus consolidation in medial left lower lobe. Remaining lungs clear. No pleural effusion or pneumothorax. Bones demineralized.  IMPRESSION: Emphysematous and minimal bronchitic changes. Atelectasis versus consolidation at medial left lower lobe.  Original Report Authenticated By: Lollie Marrow, M.D.   Medications:  Reviewed  Scheduled Meds:   . docusate sodium  100 mg Oral BID  . feeding supplement  237 mL Oral BID BM  . fentaNYL  12.5 mcg Transdermal Q72H  . folic acid  1 mg Oral Daily  . megestrol  200 mg Oral BID  . pantoprazole  40 mg Oral Q1200  . sertraline  50 mg Oral QHS  . sodium chloride      . sodium chloride      . sucralfate  1 g Oral QID   Continuous Infusions:  PRN Meds:.acetaminophen, alum & mag hydroxide-simeth, HYDROmorphone, LORazepam, ondansetron (ZOFRAN) IV, ondansetron Assessment/Plan: Hematemesis (12/05/2010) hematemesis resolved. Next H&H has been stable over the last 48 hours.  Adenocarcinoma of colon with mucinous features (09/22/2010) status post chemotherapy two weeks ago. Oncology follow up requested.  Anemia, chronic disease (09/26/2010) stable  Erosive esophagitis (10/01/2010) On ppi and sucralfate Esophageal reflux disease (11/15/2010) On ppi and sucralfate Chronic abdominal pain (12/05/2010) At baseline Pancytopenia due to antineoplastic chemotherapy (12/05/2010) neutropenia resolved. Pt spiked a fever overnight. cxr showed possible consolidation vs atelectasis. Will start him on broad spectrum antibiotics  And incentive spirometry.  Borderline hypotension: Patient was on Benicar a few days ago for his elevated blood pressure, at this time patient's blood pressure  is running Low, he does not have a history of hypertension, hence will stop the antihypertensive and monitor  his BP    LOS: 7 days   [name] 12/11/2010, 1:36 PM

## 2010-12-12 LAB — BASIC METABOLIC PANEL
BUN: 10 mg/dL (ref 6–23)
CO2: 25 mEq/L (ref 19–32)
Calcium: 8.7 mg/dL (ref 8.4–10.5)
GFR calc non Af Amer: >60 mL/min (ref >60)
Glucose, Bld: 89 mg/dL (ref 70–99)
Potassium: 4.2 mEq/L (ref 3.5–5.1)

## 2010-12-12 LAB — CBC
Hemoglobin: 8.9 g/dL — ABNORMAL LOW (ref 13.0–17.0)
MCH: 27.6 pg (ref 26.0–34.0)
MCHC: 35 g/dL (ref 30.0–36.0)
MCV: 78.6 fL (ref 78.0–100.0)

## 2010-12-12 LAB — DIFFERENTIAL
Basophils Relative: 0 % (ref 0–1)
Eosinophils Relative: 0 % (ref 0–5)
Metamyelocytes Relative: 1 %
Myelocytes: 7 %
Neutrophils Relative %: 37 % — ABNORMAL LOW (ref 43–77)
Promyelocytes Absolute: 6 %

## 2010-12-12 LAB — CULTURE, BLOOD (ROUTINE X 2)
Culture: NO GROWTH
Culture: NO GROWTH

## 2010-12-12 MED ORDER — SODIUM CHLORIDE 0.9 % IJ SOLN
INTRAMUSCULAR | Status: AC
  Administered 2010-12-12: 10 mL
  Filled 2010-12-12: qty 10

## 2010-12-12 NOTE — Progress Notes (Signed)
Subjective: Feel better today. Has his wife at bedside. Abdominal pain controlled with pain meds.  Objective: Vital signs in last 24 hours: Filed Vitals:   12/11/10 0600 12/11/10 1430 12/11/10 2115 12/12/10 0625  BP: 93/64 89/53 96/63  95/61  Pulse: 85 79 89 71  Temp: 98.7 F (37.1 C) 98.6 F (37 C) 98.4 F (36.9 C) 98.3 F (36.8 C)  TempSrc: Oral  Oral Oral  Resp: 22  20 19   Height:      Weight:      SpO2: 96% 96% 96% 96%   Weight change:   Intake/Output Summary (Last 24 hours) at 12/12/10 1621 Last data filed at 12/12/10 1405  Gross per 24 hour  Intake    250 ml  Output    475 ml  Net   -225 ml     General Appearance:    Alert, cooperative, no distress, appears stated age  Lungs:     Clear to auscultation bilaterally, respirations unlabored   Heart:    Regular rate and rhythm, S1 and S2 normal, no murmur, rub   or gallop  Abdomen:     Soft, non-tender, bowel sounds active all four quadrants,    no masses, no organomegaly  Extremities:   Extremities normal, atraumatic, no cyanosis or edema  Pulses:   2+ and symmetric all extremities  Neurologic:   CNII-XII intact, normal strength, sensation and reflexes    throughout     Lab Results: Results for orders placed during the hospital encounter of 12/04/10 (from the past 24 hour(s))  BASIC METABOLIC PANEL     Status: Abnormal   Collection Time   12/12/10  6:47 AM      Component Value Range   Sodium 131 (*) 135 - 145 (mEq/L)   Potassium 4.2  3.5 - 5.1 (mEq/L)   Chloride 99  96 - 112 (mEq/L)   CO2 25  19 - 32 (mEq/L)   Glucose, Bld 89  70 - 99 (mg/dL)   BUN 10  6 - 23 (mg/dL)   Creatinine, Ser 1.61  0.50 - 1.35 (mg/dL)   Calcium 8.7  8.4 - 09.6 (mg/dL)   GFR calc non Af Amer >60  >60 (mL/min)   GFR calc Af Amer >60  >60 (mL/min)  CBC     Status: Abnormal   Collection Time   12/12/10  6:47 AM      Component Value Range   WBC 47.1 (*) 4.0 - 10.5 (K/uL)   RBC 3.23 (*) 4.22 - 5.81 (MIL/uL)   Hemoglobin 8.9 (*)  13.0 - 17.0 (g/dL)   HCT 04.5 (*) 40.9 - 52.0 (%)   MCV 78.6  78.0 - 100.0 (fL)   MCH 27.6  26.0 - 34.0 (pg)   MCHC 35.0  30.0 - 36.0 (g/dL)   RDW 81.1 (*) 91.4 - 15.5 (%)   Platelets 227  150 - 400 (K/uL)  DIFFERENTIAL     Status: Abnormal   Collection Time   12/12/10  6:47 AM      Component Value Range   Neutrophils Relative 37 (*) 43 - 77 (%)   Lymphocytes Relative 19  12 - 46 (%)   Monocytes Relative 6  3 - 12 (%)   Eosinophils Relative 0  0 - 5 (%)   Basophils Relative 0  0 - 1 (%)   Band Neutrophils 23 (*) 0 - 10 (%)   Metamyelocytes Relative 1     Myelocytes 7     Promyelocytes Absolute  6     Blasts 1     nRBC 0  0 (/100 WBC)   RBC Morphology POLYCHROMASIA PRESENT     WBC Morphology TOXIC GRANULATION     Smear Review LARGE PLATELETS PRESENT      Micro Results: Recent Results (from the past 240 hour(s))  CULTURE, BLOOD (ROUTINE X 2)     Status: Normal   Collection Time   12/07/10  7:22 AM      Component Value Range Status Comment   Specimen Description BLOOD LEFT ARM   Final    Special Requests     Final    Value: BOTTLES DRAWN AEROBIC AND ANAEROBIC 6CC IMMUNE:COMPRM   Culture NO GROWTH 5 DAYS   Final    Report Status 12/12/2010 FINAL   Final   CULTURE, BLOOD (ROUTINE X 2)     Status: Normal   Collection Time   12/07/10  7:25 AM      Component Value Range Status Comment   Specimen Description BLOOD PORTA CATH DRAWN BY RN   Final    Special Requests     Final    Value: BOTTLES DRAWN AEROBIC AND ANAEROBIC 6CC  IMMUNE:COMPRM   Culture NO GROWTH 5 DAYS   Final    Report Status 12/12/2010 FINAL   Final   URINE CULTURE     Status: Normal   Collection Time   12/07/10  4:10 PM      Component Value Range Status Comment   Specimen Description URINE, CLEAN CATCH   Final    Special Requests NONE   Final    Setup Time [id number]   Final    Colony Count NO GROWTH   Final    Culture NO GROWTH   Final    Report Status 12/08/2010 FINAL   Final   CULTURE, BLOOD (ROUTINE  SINGLE)     Status: Normal (Preliminary result)   Collection Time   12/11/10 11:40 AM      Component Value Range Status Comment   Specimen Description BLOOD PORTA CATH DRAWN BY RN   Final    Special Requests BOTTLES DRAWN AEROBIC AND ANAEROBIC 10CC   Final    Culture NO GROWTH 1 DAY   Final    Report Status PENDING   Incomplete    Studies/Results: Dg Chest 2 View  12/11/2010  *RADIOLOGY REPORT*  Clinical Data: Fever  CHEST - 2 VIEW  Comparison: 12/07/2010  Findings: Left subclavian power port, tip SVC. Normal heart size, mediastinal contours, and pulmonary vascularity. Emphysematous and minimal bronchitic changes with atelectasis versus consolidation in medial left lower lobe. Remaining lungs clear. No pleural effusion or pneumothorax. Bones demineralized.  IMPRESSION: Emphysematous and minimal bronchitic changes. Atelectasis versus consolidation at medial left lower lobe.  Original Report Authenticated By: Lollie Marrow, M.D.   Medications: Reviewed.  Scheduled Meds:   . docusate sodium  100 mg Oral BID  . feeding supplement  237 mL Oral BID BM  . fentaNYL  12.5 mcg Transdermal Q72H  . folic acid  1 mg Oral Daily  . megestrol  200 mg Oral BID  . pantoprazole  40 mg Oral Q1200  . piperacillin-tazobactam (ZOSYN)  IV  3.375 g Intravenous Q8H  . sertraline  50 mg Oral QHS  . sodium chloride      . sucralfate  1 g Oral QID  . vancomycin  750 mg Intravenous Q12H   Continuous Infusions:  PRN Meds:.acetaminophen, alum & mag hydroxide-simeth, HYDROmorphone, LORazepam, ondansetron (ZOFRAN)  IV, ondansetron Assessment/Plan: Patient Active Hospital Problem List: Adenocarcinoma of the colon s/p chemo admitted for hemetemesis, which has resolved. Pt became pancytopenic after chemo and spiked a fever, cxr showed probable pneumonia on iv antibiotics currently. If doesn't spike a fever in the next24 hours, transition iv abx to po avelox and discharge him home. Blood cultures negative so  far  Hyponatremia : chronic and stable .   Depression : no suicidal ideation.  Pancytopenia : plts are normal, leukocytosis secondary to neupogen inj for 3 days.  H & H stable so far.     LOS: 8 days   [name] 12/12/2010, 4:21 PM

## 2010-12-13 LAB — BASIC METABOLIC PANEL
CO2: 27 mEq/L (ref 19–32)
Calcium: 9.1 mg/dL (ref 8.4–10.5)
Creatinine, Ser: 0.8 mg/dL (ref 0.50–1.35)
GFR calc Af Amer: >60 mL/min (ref >60)
GFR calc non Af Amer: >60 mL/min (ref >60)
Sodium: 130 mEq/L — ABNORMAL LOW (ref 135–145)

## 2010-12-13 LAB — VANCOMYCIN, TROUGH: Vancomycin Tr: 26.9 ug/mL (ref 10.0–20.0)

## 2010-12-13 LAB — DIFFERENTIAL
Blasts: 0 %
Eosinophils Absolute: 0 10*3/uL (ref 0.0–0.7)
Eosinophils Relative: 0 % (ref 0–5)
Lymphocytes Relative: 20 % (ref 12–46)
Metamyelocytes Relative: 4 %
Monocytes Absolute: 3.6 10*3/uL — ABNORMAL HIGH (ref 0.1–1.0)
Monocytes Relative: 9 % (ref 3–12)
Neutro Abs: 28.6 10*3/uL — ABNORMAL HIGH (ref 1.7–7.7)
Neutrophils Relative %: 43 % (ref 43–77)
nRBC: 0 /100 WBC

## 2010-12-13 LAB — CBC
MCV: 80.1 fL (ref 78.0–100.0)
Platelets: 255 10*3/uL (ref 150–400)
RBC: 3.42 MIL/uL — ABNORMAL LOW (ref 4.22–5.81)
RDW: 20 % — ABNORMAL HIGH (ref 11.5–15.5)
WBC: 40.3 10*3/uL — ABNORMAL HIGH (ref 4.0–10.5)

## 2010-12-13 MED ORDER — SODIUM CHLORIDE 0.9 % IJ SOLN
INTRAMUSCULAR | Status: AC
  Administered 2010-12-13: 14:00:00
  Filled 2010-12-13: qty 10

## 2010-12-13 NOTE — Progress Notes (Signed)
Subjective: Looks much better than when i admitted him a week ago.  Eating well.  Pain better.  Afebrile about 24 hours. Objective: Vital signs in last 24 hours: Filed Vitals:   12/12/10 0625 12/12/10 1400 12/12/10 2306 12/13/10 0649  BP: 95/61 100/64 111/73 98/63  Pulse: 71 68 76 76  Temp: 98.3 F (36.8 C) 98.4 F (36.9 C) 98 F (36.7 C) 98.6 F (37 C)  TempSrc: Oral Oral Oral Oral  Resp: 19 22 18 18   Height:      Weight:      SpO2: 96% 95% 97% 96%   Weight change:   Intake/Output Summary (Last 24 hours) at 12/13/10 0959 Last data filed at 12/13/10 0547  Gross per 24 hour  Intake   1630 ml  Output   1850 ml  Net   -220 ml   BP 98/63  Pulse 76  Temp(Src) 98.6 F (37 C) (Oral)  Resp 18  Ht 5\' 9"  (1.753 m)  Wt 52.8 kg (116 lb 6.5 oz)  BMI 17.19 kg/m2  SpO2 96%  General Appearance:    Alert, cooperative, no distress, appears stated age  Head:    Normocephalic, without obvious abnormality, atraumatic  Eyes:    PERRL, conjunctiva/corneas clear, EOM's intact, fundi    benign, both eyes       Ears:    Normal TM's and external ear canals, both ears  Nose:   Nares normal, septum midline, mucosa normal, no drainage    or sinus tenderness  Throat:   Lips, mucosa, and tongue normal; teeth and gums normal  Neck:   Supple, symmetrical, trachea midline, no adenopathy;       thyroid:  No enlargement/tenderness/nodules; no carotid   bruit or JVD  Back:     Symmetric, no curvature, ROM normal, no CVA tenderness  Lungs:     Clear to auscultation bilaterally, respirations unlabored  Chest wall:    No tenderness or deformity  Heart:    Regular rate and rhythm, S1 and S2 normal, no murmur, rub   or gallop  Abdomen:     Soft, non-tender, bowel sounds active all four quadrants,    no masses, no organomegaly        Extremities:   Extremities normal, atraumatic, no cyanosis or edema  Pulses:   2+ and symmetric all extremities  Skin:   Skin color, texture, turgor normal, no rashes or  lesions  Lymph nodes:   Cervical, supraclavicular, and axillary nodes normal  Neurologic:   CNII-XII intact. Normal strength, sensation and reflexes      throughout   Lab Results: Admission on 12/04/2010  No results displayed because visit has over 200 results.     Micro Results: Recent Results (from the past 240 hour(s))  CULTURE, BLOOD (ROUTINE X 2)     Status: Normal   Collection Time   12/07/10  7:22 AM      Component Value Range Status Comment   Specimen Description BLOOD LEFT ARM   Final    Special Requests     Final    Value: BOTTLES DRAWN AEROBIC AND ANAEROBIC 6CC IMMUNE:COMPRM   Culture NO GROWTH 5 DAYS   Final    Report Status 12/12/2010 FINAL   Final   CULTURE, BLOOD (ROUTINE X 2)     Status: Normal   Collection Time   12/07/10  7:25 AM      Component Value Range Status Comment   Specimen Description BLOOD PORTA CATH DRAWN BY RN  Final    Special Requests     Final    Value: BOTTLES DRAWN AEROBIC AND ANAEROBIC 6CC  IMMUNE:COMPRM   Culture NO GROWTH 5 DAYS   Final    Report Status 12/12/2010 FINAL   Final   URINE CULTURE     Status: Normal   Collection Time   12/07/10  4:10 PM      Component Value Range Status Comment   Specimen Description URINE, CLEAN CATCH   Final    Special Requests NONE   Final    Setup Time [id number]   Final    Colony Count NO GROWTH   Final    Culture NO GROWTH   Final    Report Status 12/08/2010 FINAL   Final   CULTURE, BLOOD (ROUTINE SINGLE)     Status: Normal (Preliminary result)   Collection Time   12/11/10 11:40 AM      Component Value Range Status Comment   Specimen Description BLOOD PORTA CATH DRAWN BY RN   Final    Special Requests BOTTLES DRAWN AEROBIC AND ANAEROBIC 10CC   Final    Culture NO GROWTH 2 DAYS   Final    Report Status PENDING   Incomplete    Studies/Results: Dg Chest 2 View  12/11/2010  *RADIOLOGY REPORT*  Clinical Data: Fever  CHEST - 2 VIEW  Comparison: 12/07/2010  Findings: Left subclavian power port, tip  SVC. Normal heart size, mediastinal contours, and pulmonary vascularity. Emphysematous and minimal bronchitic changes with atelectasis versus consolidation in medial left lower lobe. Remaining lungs clear. No pleural effusion or pneumothorax. Bones demineralized.  IMPRESSION: Emphysematous and minimal bronchitic changes. Atelectasis versus consolidation at medial left lower lobe.  Original Report Authenticated By: Lollie Marrow, M.D.   Scheduled Meds:   . docusate sodium  100 mg Oral BID  . feeding supplement  237 mL Oral BID BM  . fentaNYL  12.5 mcg Transdermal Q72H  . folic acid  1 mg Oral Daily  . megestrol  200 mg Oral BID  . pantoprazole  40 mg Oral Q1200  . piperacillin-tazobactam (ZOSYN)  IV  3.375 g Intravenous Q8H  . sertraline  50 mg Oral QHS  . sodium chloride      . sucralfate  1 g Oral QID  . vancomycin  750 mg Intravenous Q12H   Continuous Infusions:  PRN Meds:.acetaminophen, alum & mag hydroxide-simeth, HYDROmorphone, LORazepam, ondansetron (ZOFRAN) IV, ondansetron Assessment/Plan: Principal Problem:  *Hematemesis Active Problems:  Adenocarcinoma of colon with mucinous features  Anemia, chronic disease  S/P partial gastrectomy  Personal history of PE (pulmonary embolism)  Erosive esophagitis  Esophageal reflux disease  Chronic abdominal pain  Pancytopenia due to antineoplastic chemotherapy  Pneumonia  D/c tom if continues to be afebrile.  Cont current meds.  D/c on avelox.   LOS: 9 days   Brinly Maietta A 12/13/2010, 9:59 AM

## 2010-12-14 ENCOUNTER — Telehealth (HOSPITAL_COMMUNITY): Payer: Self-pay | Admitting: *Deleted

## 2010-12-14 LAB — BASIC METABOLIC PANEL
Chloride: 97 mEq/L (ref 96–112)
GFR calc Af Amer: >60 mL/min (ref >60)
GFR calc non Af Amer: >60 mL/min (ref >60)
Glucose, Bld: 133 mg/dL — ABNORMAL HIGH (ref 70–99)
Potassium: 4.7 mEq/L (ref 3.5–5.1)
Sodium: 130 mEq/L — ABNORMAL LOW (ref 135–145)

## 2010-12-14 LAB — DIFFERENTIAL
Lymphs Abs: 3.2 10*3/uL (ref 0.7–4.0)
Monocytes Absolute: 4.4 10*3/uL — ABNORMAL HIGH (ref 0.1–1.0)
Monocytes Relative: 13 % — ABNORMAL HIGH (ref 3–12)
Neutro Abs: 27.1 10*3/uL — ABNORMAL HIGH (ref 1.7–7.7)
Neutrophils Relative %: 78 % — ABNORMAL HIGH (ref 43–77)

## 2010-12-14 LAB — CBC
HCT: 25.6 % — ABNORMAL LOW (ref 39.0–52.0)
Hemoglobin: 8.6 g/dL — ABNORMAL LOW (ref 13.0–17.0)
RBC: 3.15 MIL/uL — ABNORMAL LOW (ref 4.22–5.81)

## 2010-12-14 MED ORDER — HEPARIN SOD (PORK) LOCK FLUSH 100 UNIT/ML IV SOLN
INTRAVENOUS | Status: AC
  Filled 2010-12-14: qty 5

## 2010-12-14 MED ORDER — FENTANYL 12 MCG/HR TD PT72: 1.0000 | MEDICATED_PATCH | TRANSDERMAL | Status: AC

## 2010-12-14 MED ORDER — HYDROMORPHONE HCL 2 MG PO TABS: 2.0000 mg | ORAL_TABLET | ORAL | Status: AC | PRN

## 2010-12-14 MED ORDER — HEPARIN SOD (PORK) LOCK FLUSH 100 UNIT/ML IV SOLN
500.0000 [IU] | Freq: Once | INTRAVENOUS | Status: DC
  Filled 2010-12-14: qty 5

## 2010-12-14 MED ORDER — LEVOFLOXACIN 500 MG PO TABS: 500.0000 mg | ORAL_TABLET | Freq: Every day | ORAL | Status: AC

## 2010-12-14 NOTE — Telephone Encounter (Signed)
Ok we will leave him on 10/1 as scheduled.

## 2010-12-14 NOTE — Telephone Encounter (Signed)
no

## 2010-12-14 NOTE — Consult Note (Signed)
ANTIBIOTIC CONSULT NOTE   Pharmacy Consult for vancomycin and zosyn Indication:R/o pna No Known Allergies  Patient Measurements: Height: 5\' 9"  (175.3 cm) Weight: 116 lb 6.5 oz (52.8 kg) IBW/kg (Calculated) : 70.7   Vital Signs: Temp: 98.8 F (37.1 C) (09/24 0615) Temp src: Oral (09/24 0615) BP: 88/58 mmHg (09/24 0615) Pulse Rate: 72  (09/24 0615) Intake/Output from previous day: 09/23 0701 - 09/24 0700 In: 932 [P.O.:480; IV Piggyback:452] Out: 1400 [Urine:1400] Intake/Output from this shift: Total I/O In: 360 [P.O.:360] Out: 250 [Urine:250] Labs:  [facility] 12/14/10 0459 12/13/10 0540 12/12/10 0647  WBC 34.7* 40.3* 47.1*  HGB 8.6* 9.5* 8.9*  PLT 275 255 227  LABCREA -- -- --  CREATININE 0.76 0.80 0.81  CRCLEARANCE -- -- --    Basename 12/13/10 1410 12/13/10 0540  VANCOTROUGH 15.3 26.9*  VANCOPEAK -- --  [name] -- --  GENTTROUGH -- --  GENTPEAK -- --  GENTRANDOM -- --  TOBRATROUGH -- --  TOBRAPEAK -- --  TOBRARND -- --  AMIKACINPEAK -- --  AMIKACINTROU -- --  AMIKACIN -- --    Microbiology: Recent Results (from the past 720 hour(s))  CULTURE, BLOOD (ROUTINE X 2)     Status: Normal   Collection Time   12/07/10  7:22 AM      Component Value Range Status Comment   Specimen Description BLOOD LEFT ARM   Final    Special Requests     Final    Value: BOTTLES DRAWN AEROBIC AND ANAEROBIC 6CC IMMUNE:COMPRM   Culture NO GROWTH 5 DAYS   Final    Report Status 12/12/2010 FINAL   Final   CULTURE, BLOOD (ROUTINE X 2)     Status: Normal   Collection Time   12/07/10  7:25 AM      Component Value Range Status Comment   Specimen Description BLOOD PORTA CATH DRAWN BY RN   Final    Special Requests     Final    Value: BOTTLES DRAWN AEROBIC AND ANAEROBIC 6CC  IMMUNE:COMPRM   Culture NO GROWTH 5 DAYS   Final    Report Status 12/12/2010 FINAL   Final   URINE CULTURE     Status: Normal   Collection Time   12/07/10  4:10 PM      Component Value Range Status Comment   Specimen Description URINE, CLEAN CATCH   Final    Special Requests NONE   Final    Setup Time [id number]   Final    Colony Count NO GROWTH   Final    Culture NO GROWTH   Final    Report Status 12/08/2010 FINAL   Final   CULTURE, BLOOD (ROUTINE SINGLE)     Status: Normal (Preliminary result)   Collection Time   12/11/10 11:40 AM      Component Value Range Status Comment   Specimen Description BLOOD PORTA CATH DRAWN BY RN   Final    Special Requests BOTTLES DRAWN AEROBIC AND ANAEROBIC 10CC   Final    Culture NO GROWTH 3 DAYS   Final    Report Status PENDING   Incomplete    Medical History: Past Medical History  Diagnosis Date  . 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 Pulmonary embolism 02/2010  . Acid reflux   . Hypertension   . Osteoporosis   . Arthritis   . TIA (transient ischemic attack) 10/11  . ETOH abuse     quit 03/2010  .  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 Personal history of PE (pulmonary embolism) 10/01/2010  . Blood transfusion   . S/P endoscopy September 28, 2010    erosive reflux esophagitis, [place] I anatomy   Medications:  Scheduled:     . docusate sodium  100 mg Oral BID  . feeding supplement  237 mL Oral BID BM  . fentaNYL  12.5 mcg Transdermal Q72H  . folic acid  1 mg Oral Daily  . megestrol  200 mg Oral BID  . pantoprazole  40 mg Oral Q1200  . piperacillin-tazobactam (ZOSYN)  IV  3.375 g Intravenous Q8H  . sertraline  50 mg Oral QHS  . sodium chloride      . sucralfate  1 g Oral QID  . vancomycin  750 mg Intravenous Q12H   Assessment:  Renal fxn OK  Vancomycin trough within desired range.  Goal of Therapy:  Vancomycin trough level 15-20 mcg/ml  Plan: Continue Vancomycin 750mg  IV q12hrs.  Monitor renal fxn per protocol  Caryl Asp 12/14/2010,11:08 AM

## 2010-12-14 NOTE — Telephone Encounter (Signed)
Received call from Tameka on 3A, questioning if pt is to have chemo today? Please advise.

## 2010-12-14 NOTE — Discharge Summary (Signed)
Physician Discharge Summary  [name] MRN: [id number] DOB/AGE: 60-Dec-1952 60 y.o. Primary Care Physician:DAVIDSON,ERIC, MD, MD Admit date: 12/04/2010 Discharge date: 12/14/2010    Discharge Diagnoses:  Principal Problem:  *Hematemesis Active Problems:  Adenocarcinoma of colon with mucinous features  Anemia, chronic disease  S/P partial gastrectomy  Personal history of PE (pulmonary embolism)  Erosive esophagitis  Esophageal reflux disease  Chronic abdominal pain  Pancytopenia due to antineoplastic chemotherapy  Pneumonia   Current Discharge Medication List    START taking these medications   Details  fentaNYL (DURAGESIC - DOSED MCG/HR) 12 MCG/HR Place 1 patch (12.5 mcg total) onto the skin every 3 (three) days. Qty: 5 patch, Refills: 0    HYDROmorphone (DILAUDID) 2 MG tablet Take 1-2 tablets (2-4 mg total) by mouth every 4 (four) hours as needed. Qty: 60 tablet, Refills: 0    levofloxacin (LEVAQUIN) 500 MG tablet Take 1 tablet (500 mg total) by mouth daily. Qty: 7 tablet, Refills: 0      CONTINUE these medications which have NOT CHANGED   Details  CYANOCOBALAMIN IJ Inject 1,000 mcg as directed every 30 (thirty) days.      !! cyclobenzaprine (FLEXERIL) 10 MG tablet Take 10 mg by mouth as needed. For muscle spasms    feeding supplement (ENSURE CLINICAL STRENGTH) LIQD Take 237 mLs by mouth 2 (two) times daily between meals.    folic acid (FOLVITE) 1 MG tablet Take 1 mg by mouth daily.      lidocaine-prilocaine (EMLA) cream Apply 1 application topically as needed. For port access    LORazepam (ATIVAN) 1 MG tablet Take 1 mg by mouth every 4 (four) hours as needed. Every 3-4 hours prn n/v    megestrol (MEGACE) 40 MG/ML suspension Take 200 mg by mouth 2 (two) times daily.      olmesartan (BENICAR) 5 MG tablet Take 5 mg by mouth daily.      omeprazole (PRILOSEC) 20 MG capsule Take 40 mg by mouth daily.     ondansetron (ZOFRAN) 4 MG tablet Take 4 mg  by mouth every 12 (twelve) hours as needed. For nausea    prochlorperazine (COMPAZINE) 10 MG tablet Take 10 mg by mouth every 6 (six) hours as needed.      prochlorperazine (COMPAZINE) 25 MG suppository Place 25 mg rectally every 12 (twelve) hours as needed. For nausea    sertraline (ZOLOFT) 50 MG tablet Take 50 mg by mouth at bedtime.     sucralfate (CARAFATE) 1 GM/10ML suspension Take 10 mLs (1 g total) by mouth 4 (four) times daily. Qty: 420 mL, Refills: 2    thiamine 100 MG tablet Take 100 mg by mouth daily.      !! Cyclobenzaprine HCl (FLEXERIL PO) Take 10 mg by mouth 3 (three) times daily as needed.       !! - Potential duplicate medications found. Please discuss with provider.    STOP taking these medications     HYDROcodone-acetaminophen (NORCO) 5-325 MG per tablet         Discharged Condition: Stable and improved    Consults: GI and oncology  Significant Diagnostic Studies: Dg Chest 2 View  12/11/2010  *RADIOLOGY REPORT*  Clinical Data: Fever  CHEST - 2 VIEW  Comparison: 12/07/2010  Findings: Left subclavian power port, tip SVC. Normal heart size, mediastinal contours, and pulmonary vascularity. Emphysematous and minimal bronchitic changes with atelectasis versus consolidation in medial left lower lobe. Remaining lungs clear. No pleural effusion or pneumothorax. Bones demineralized.  IMPRESSION: Emphysematous and minimal bronchitic changes. Atelectasis versus consolidation at medial left lower lobe.  Original Report Authenticated By: Lollie Marrow, M.D.   Dg Chest 2 View  12/07/2010  *RADIOLOGY REPORT*  Clinical Data: Shortness of breath, immunocompromised  CHEST - 2 VIEW  Comparison: 09/29/2010  Findings: Normal heart size and pulmonary vascularity. Tortuous aorta. Mild eventration left diaphragm. Emphysematous changes without infiltrate or effusion. No pneumothorax. Bones demineralized. Perigastric surgical clips. Left subclavian Port-A-Cath, tip SVC.  IMPRESSION:  Emphysematous changes without acute infiltrate.  Original Report Authenticated By: Lollie Marrow, M.D.   Ct Angio Chest W/cm &/or Wo Cm  11/15/2010  *RADIOLOGY REPORT*  Clinical Data:  Chest pain, cough.  History of colon cancer.  CT ANGIOGRAPHY CHEST WITH CONTRAST  Technique:  Multidetector CT imaging of the chest was performed using the standard protocol during bolus administration of intravenous contrast.  Multiplanar CT image reconstructions including MIPs were obtained to evaluate the vascular anatomy.  Contrast:  100 ml Omnipaque 350 IV contrast  Comparison:  09/29/2010 chest radiograph, chest CT 08/28/2010  Findings:  Left sided Port-A-Cath in place, with tip in the mid SVC.  The study is of adequate technical quality for evaluation for pulmonary embolism up to and including the 3rd order pulmonary arteries.  Apparent filling defect within the branch points of the left lower lobe main pulmonary artery seen on one view only is most compatible with artifact due to volume averaging at the pulmonary artery branch points.  No filling defect is seen to suggest acute pulmonary embolism up to and including the 3rd order pulmonary arteries.  Heart size is normal.  No pericardial or pleural effusion.  8 mm pretracheal lymph node is slightly smaller, measuring 0.6 cm image 34.  There is diffuse esophageal wall thickening with internal air fluid level.  Wall thickening measures approximately 5 mm image 46.  This is present to the level of the gastroesophageal junction. This is new since the prior exam.  Lung fields demonstrate stable 2-3 mm right upper lobe pleural parenchymal nodular opacities, for example images 14, 15, and 17. Curvilinear right lower lobe scarring is stable.  2 mm left lower lobe pulmonary nodule image 56 is stable.  Central airways are patent.  No acute osseous abnormality.  Mild rightward curvature centered at T4 is stable.  Review of the MIP images confirms the above findings.  IMPRESSION: No  acute cardiopulmonary process.  Specifically, no acute pulmonary embolism is identified up to and including the 3rd order pulmonary arteries.  Stable nonspecific 2-3 mm bilateral pulmonary parenchymal nodules. These are amenable to follow-up per the patient's overall staging treatment plan.  Diffuse esophageal wall thickening which is new since the prior study and may represent reflux esophagitis, other infection/inflammation such as candidiasis if the patient is immunosuppressed, or much less likely neoplastic involvement given the development since the recent previous exam.  Original Report Authenticated By: Harrel Lemon, M.D.   Ct Abdomen Pelvis W Contrast  12/01/2010  *RADIOLOGY REPORT*  Clinical Data: Left lower abdominal pain  CT ABDOMEN AND PELVIS WITH CONTRAST  Technique:  Multidetector CT imaging of the abdomen and pelvis was performed following the standard protocol during bolus administration of intravenous contrast.  Contrast: OMNIPAQUE IOHEXOL 300 MG/ML IV SOLN  Comparison: 08/14/2010  Findings: Limited images through the lung bases demonstrate no significant appreciable abnormality. The heart size is within normal limits. No pleural or pericardial effusion.  Small hiatal hernia distal esophageal circumferential wall thickening.  Unremarkable  liver, spleen, and adrenal glands.  There is mild prominence of the pancreatic duct to the level of the ampulla.  No obstructing lesion identified.  The common bile duct is also mildly prominent, measuring up to 8 millimeters in diameter.  Bilateral renal hypodensities are too small to further characterized with the exception of a 2.9 cm cyst arising anteriorly from the right kidney.  No hydronephrosis.  No hydroureter.  Moderate amount of air and stool within the colon. Colonic bowel anastomotic suture noted within the left hemiabdomen.  Surgical clips within the mesentery in the left mid abdomen.  No free or loculated fluid collections.  No free  intraperitoneal air.  The stomach and proximal small bowel are distended.  The duodenal measures up to 5.7 cm.  Jejunal loops are decompressed, with proximal anastomotic suture noted. Loops of ileum contain fluid. No bowel wall thickening.  Normal caliber vasculature.  Thin-walled bladder, partially decompressed.  No free fluid within the pelvis.  Left femoral neck bone island.  Diffuse osteopenia.  No aggressive osseous lesion identified.  IMPRESSION: Postsurgical changes involving the small and large bowel.  There is distension of the stomach and duodenum to the level of the small bowel anastamosis, suggesting delayed transit without complete obstruction. No free intraperitoneal air or fluid.  Mild distension of the main pancreatic duct and CBD, to the level of the ampulla.  This is increased in the interval.  Correlate with LFTs and ERCP or MRCP if clinically warranted.  Small hiatal hernia and circumferential distal esophageal wall thickening. This is a nonspecific finding however can be seen with gastroesophageal reflux disease.  Original Report Authenticated By: Waneta Martins, M.D.   Dg Abd Acute W/chest  12/04/2010  *RADIOLOGY REPORT*  Clinical Data: Abdominal pain, nausea, vomiting, history colon cancer with surgery chemotherapy, hypertension, appendectomy, cholecystectomy, smoking  ACUTE ABDOMEN SERIES (ABDOMEN 2 VIEW & CHEST 1 VIEW)  Comparison: 11/15/2010  Findings: Normal heart size, mediastinal contours, and pulmonary vascularity. Minimal elongation of thoracic aorta noted. Left subclavian power port, tip projecting over SVC. Emphysematous changes without infiltrate or effusion. No pneumothorax. Diffuse osseous demineralization.  Gaseous distention of the proximal colon to the level of the distal sigmoid colon. Small amount of rectal gas though decompressed. Findings could represent colonic ileus or distal colonic obstruction. Scattered surgical clips throughout abdomen. Few dilated air filled  loops of small bowel also identified. No free intraperitoneal air or definite urinary tract calcification. Bones appear demineralized.  IMPRESSION: Emphysematous changes. Colonic dilatation to the level of distal sigmoid colon, with a few associated mildly dilated air-filled small bowel loops. Findings could represent ileus or distal colonic obstruction.  Original Report Authenticated By: Lollie Marrow, M.D.   Dg Abd Acute W/chest  11/15/2010  *RADIOLOGY REPORT*  Clinical Data: Shortness of breath.  Abdominal pain.  Weakness. Previous colon cancer resection and chemotherapy.  ACUTE ABDOMEN SERIES (ABDOMEN 2 VIEW & CHEST 1 VIEW)  Comparison: 09/26/2010.  Findings: The heart remains normal in size and the lungs are clear and remain mildly hyperexpanded.  Stable left subclavian porta catheter.  Dilated loops of colon and small bowel with gas seen through to the rectum.  Multiple surgical clips and staples in the abdomen bilaterally.  No free peritoneal air.  Diffuse osteopenia.  IMPRESSION:  1.  Diffuse colonic and small probable ileus. 2.  Mild changes of COPD.  Original Report Authenticated By: Darrol Angel, M.D.    Lab Results: Results for orders placed during the hospital encounter of  12/04/10 (from the past 48 hour(s))  VANCOMYCIN, TROUGH     Status: Abnormal   Collection Time   12/13/10  5:40 AM      Component Value Range Comment   Vancomycin Tr 26.9 (*) 10.0 - 20.0 (ug/mL)   BASIC METABOLIC PANEL     Status: Abnormal   Collection Time   12/13/10  5:40 AM      Component Value Range Comment   Sodium 130 (*) 135 - 145 (mEq/L)    Potassium 4.7  3.5 - 5.1 (mEq/L)    Chloride 95 (*) 96 - 112 (mEq/L)    CO2 27  19 - 32 (mEq/L)    Glucose, Bld 89  70 - 99 (mg/dL)    BUN 9  6 - 23 (mg/dL)    Creatinine, Ser 8.29  0.50 - 1.35 (mg/dL)    Calcium 9.1  8.4 - 10.5 (mg/dL)    GFR calc non Af Amer >60  >60 (mL/min)    GFR calc Af Amer >60  >60 (mL/min)   CBC     Status: Abnormal   Collection Time    12/13/10  5:40 AM      Component Value Range Comment   WBC 40.3 (*) 4.0 - 10.5 (K/uL)    RBC 3.42 (*) 4.22 - 5.81 (MIL/uL)    Hemoglobin 9.5 (*) 13.0 - 17.0 (g/dL)    HCT 56.2 (*) 13.0 - 52.0 (%)    MCV 80.1  78.0 - 100.0 (fL)    MCH 27.8  26.0 - 34.0 (pg)    MCHC 34.7  30.0 - 36.0 (g/dL)    RDW 86.5 (*) 78.4 - 15.5 (%)    Platelets 255  150 - 400 (K/uL)   DIFFERENTIAL     Status: Abnormal   Collection Time   12/13/10  5:40 AM      Component Value Range Comment   Neutrophils Relative 43  43 - 77 (%)    Lymphocytes Relative 20  12 - 46 (%)    Monocytes Relative 9  3 - 12 (%)    Eosinophils Relative 0  0 - 5 (%)    Basophils Relative 0  0 - 1 (%)    Band Neutrophils 15 (*) 0 - 10 (%)    Metamyelocytes Relative 4      Myelocytes 5      Promyelocytes Absolute 4      Blasts 0      nRBC 0  0 (/100 WBC)    Neutro Abs 28.6 (*) 1.7 - 7.7 (K/uL)    Lymphs Abs 8.1 (*) 0.7 - 4.0 (K/uL)    Monocytes Absolute 3.6 (*) 0.1 - 1.0 (K/uL)    Eosinophils Absolute 0.0  0.0 - 0.7 (K/uL)    Basophils Absolute 0.0  0.0 - 0.1 (K/uL)    RBC Morphology POLYCHROMASIA PRESENT      WBC Morphology        Value: MODERATE LEFT SHIFT (>5% METAS AND MYELOS,OCC PRO NOTED)   Smear Review LARGE PLATELETS PRESENT     VANCOMYCIN, TROUGH     Status: Normal   Collection Time   12/13/10  2:10 PM      Component Value Range Comment   Vancomycin Tr 15.3  10.0 - 20.0 (ug/mL)   BASIC METABOLIC PANEL     Status: Abnormal   Collection Time   12/14/10  4:59 AM      Component Value Range Comment   Sodium 130 (*) 135 - 145 (  mEq/L)    Potassium 4.7  3.5 - 5.1 (mEq/L)    Chloride 97  96 - 112 (mEq/L)    CO2 26  19 - 32 (mEq/L)    Glucose, Bld 133 (*) 70 - 99 (mg/dL)    BUN 8  6 - 23 (mg/dL)    Creatinine, Ser 1.61  0.50 - 1.35 (mg/dL)    Calcium 8.5  8.4 - 10.5 (mg/dL)    GFR calc non Af Amer >60  >60 (mL/min)    GFR calc Af Amer >60  >60 (mL/min)   CBC     Status: Abnormal   Collection Time   12/14/10  4:59 AM       Component Value Range Comment   WBC 34.7 (*) 4.0 - 10.5 (K/uL)    RBC 3.15 (*) 4.22 - 5.81 (MIL/uL)    Hemoglobin 8.6 (*) 13.0 - 17.0 (g/dL)    HCT 09.6 (*) 04.5 - 52.0 (%)    MCV 81.3  78.0 - 100.0 (fL)    MCH 27.3  26.0 - 34.0 (pg)    MCHC 33.6  30.0 - 36.0 (g/dL)    RDW 40.9 (*) 81.1 - 15.5 (%)    Platelets 275  150 - 400 (K/uL)   DIFFERENTIAL     Status: Abnormal   Collection Time   12/14/10  4:59 AM      Component Value Range Comment   Neutrophils Relative 78 (*) 43 - 77 (%)    Neutro Abs 27.1 (*) 1.7 - 7.7 (K/uL)    Lymphocytes Relative 9 (*) 12 - 46 (%)    Lymphs Abs 3.2  0.7 - 4.0 (K/uL)    Monocytes Relative 13 (*) 3 - 12 (%)    Monocytes Absolute 4.4 (*) 0.1 - 1.0 (K/uL)    Eosinophils Relative 0  0 - 5 (%)    Eosinophils Absolute 0.0  0.0 - 0.7 (K/uL)    Basophils Relative 0  0 - 1 (%)    Basophils Absolute 0.0  0.0 - 0.1 (K/uL)    Recent Results (from the past 240 hour(s))  CULTURE, BLOOD (ROUTINE X 2)     Status: Normal   Collection Time   12/07/10  7:22 AM      Component Value Range Status Comment   Specimen Description BLOOD LEFT ARM   Final    Special Requests     Final    Value: BOTTLES DRAWN AEROBIC AND ANAEROBIC 6CC IMMUNE:COMPRM   Culture NO GROWTH 5 DAYS   Final    Report Status 12/12/2010 FINAL   Final   CULTURE, BLOOD (ROUTINE X 2)     Status: Normal   Collection Time   12/07/10  7:25 AM      Component Value Range Status Comment   Specimen Description BLOOD PORTA CATH DRAWN BY RN   Final    Special Requests     Final    Value: BOTTLES DRAWN AEROBIC AND ANAEROBIC 6CC  IMMUNE:COMPRM   Culture NO GROWTH 5 DAYS   Final    Report Status 12/12/2010 FINAL   Final   URINE CULTURE     Status: Normal   Collection Time   12/07/10  4:10 PM      Component Value Range Status Comment   Specimen Description URINE, CLEAN CATCH   Final    Special Requests NONE   Final    Setup Time [id number]   Final    Colony Count NO GROWTH   Final  Culture NO GROWTH   Final     Report Status 12/08/2010 FINAL   Final   CULTURE, BLOOD (ROUTINE SINGLE)     Status: Normal (Preliminary result)   Collection Time   12/11/10 11:40 AM      Component Value Range Status Comment   Specimen Description BLOOD PORTA CATH DRAWN BY RN   Final    Special Requests BOTTLES DRAWN AEROBIC AND ANAEROBIC 10CC   Final    Culture NO GROWTH 3 DAYS   Final    Report Status PENDING   Incomplete      Hospital Course: 60 year-old male who was admitted for uncontrolled acute on chronic pain and hematemesis times one was found to have esophagitis and developed neutropenic fever while he was here. His cultures have all been negative he has been impaired we treated for pneumonia and has been on IV antibiotics for approximately 6 days this has been switched over to oral antibiotics for which she will complete another week. His chemotherapy has been interrupted due to to this hospitalization. He would need further close up follow up with oncology for further treatment of his colon cancer. He did receive Neupogen while he was here as a result his white count is now elevated this would be followed as an outpatient. He has been afebrile for over 48 hours. We did offer him home health however he refused this as his wife works and his primary care physician's office and takes very good care of him at home.   Discharge Exam: Blood pressure 88/58, pulse 72, temperature 98.8 F (37.1 C), temperature source Oral, resp. rate 18, height 5\' 9"  (1.753 m), weight 52.8 kg (116 lb 6.5 oz), SpO2 98.00%. Alert and oriented no apparent distress regular rate and rhythm without murmurs or gallops chest clear to ossification bilaterally no wheezes record or rales abdomen is soft nontender nondistended positive bowel sounds no hepatosplenomegaly extremities no clubbing cyanosis or edema psych normal mood and affect neuro no focal neurologic deficits skin no rashes  Disposition: Home  Discharge Orders    Future Appointments:  Provider: Department: Dept Phone: Center:   12/21/2010 8:45 AM [facility] [phone] None   12/23/2010 2:00 PM [facility] [phone] None   01/19/2011 9:00 AM Tana Coast, PA [facility] [phone] [facility]     Future Orders Please Complete By Expires   Diet - low sodium heart healthy      Increase activity slowly         Follow-up Information    Follow up with Randall An, MD. Make an appointment in 1 week.   Contact information:   [address] [place] [address] [phone]       Follow up with [facility], MD in 1 week.   Contact information:   [address] [address] [phone]          Signed: Haydee Monica 12/14/2010, 12:33 PM

## 2010-12-16 LAB — CULTURE, BLOOD (SINGLE)

## 2010-12-17 ENCOUNTER — Telehealth (HOSPITAL_COMMUNITY): Payer: Self-pay | Admitting: Oncology

## 2010-12-17 NOTE — Telephone Encounter (Signed)
Patient's wife called with concerns about patient.  She reports that she is concerned about pain medication use.  She questions whether he truly needs pain medications.  He was given a fentanyl patch and this has yet to be filled.  Also concerned about his mental status and poor hygiene.  Patient has been unable to operate the telephone correctly.  He is saying  Words and sentences that are not correct for the situation.  She tells me that she cannot tell me the last time he bathed or brushed his teeth.  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  The patient is due for Chemotherapy on Monday.  Will discuss this with Dr. Mariel Sleet.  Patient and his wife are coming in for an appointment to go over things regarding his care.  [name]

## 2010-12-21 ENCOUNTER — Encounter (HOSPITAL_BASED_OUTPATIENT_CLINIC_OR_DEPARTMENT_OTHER): Payer: Federal, State, Local not specified - PPO | Admitting: Oncology

## 2010-12-21 ENCOUNTER — Telehealth (HOSPITAL_COMMUNITY): Payer: Self-pay

## 2010-12-21 ENCOUNTER — Encounter (HOSPITAL_COMMUNITY): Payer: Federal, State, Local not specified - PPO | Attending: Oncology

## 2010-12-21 VITALS — BP 119/81 | HR 89 | Temp 98.0°F | Wt 117.2 lb

## 2010-12-21 DIAGNOSIS — Z86718 Personal history of other venous thrombosis and embolism: Secondary | ICD-10-CM

## 2010-12-21 DIAGNOSIS — C189 Malignant neoplasm of colon, unspecified: Secondary | ICD-10-CM | POA: Insufficient documentation

## 2010-12-21 DIAGNOSIS — K922 Gastrointestinal hemorrhage, unspecified: Secondary | ICD-10-CM | POA: Insufficient documentation

## 2010-12-21 DIAGNOSIS — Z5111 Encounter for antineoplastic chemotherapy: Secondary | ICD-10-CM

## 2010-12-21 DIAGNOSIS — R059 Cough, unspecified: Secondary | ICD-10-CM | POA: Insufficient documentation

## 2010-12-21 DIAGNOSIS — R079 Chest pain, unspecified: Secondary | ICD-10-CM | POA: Insufficient documentation

## 2010-12-21 DIAGNOSIS — R05 Cough: Secondary | ICD-10-CM | POA: Insufficient documentation

## 2010-12-21 DIAGNOSIS — Z903 Acquired absence of stomach [part of]: Secondary | ICD-10-CM

## 2010-12-21 DIAGNOSIS — D509 Iron deficiency anemia, unspecified: Secondary | ICD-10-CM

## 2010-12-21 LAB — COMPREHENSIVE METABOLIC PANEL
AST: 11 U/L (ref 0–37)
Albumin: 2.7 g/dL — ABNORMAL LOW (ref 3.5–5.2)
Alkaline Phosphatase: 89 U/L (ref 39–117)
BUN: 7 mg/dL (ref 6–23)
CO2: 18 mEq/L — ABNORMAL LOW (ref 19–32)
Chloride: 106 mEq/L (ref 96–112)
Creatinine, Ser: 0.79 mg/dL (ref 0.50–1.35)
GFR calc non Af Amer: >90 mL/min (ref >90)
Potassium: 3.6 mEq/L (ref 3.5–5.1)
Total Bilirubin: 0.2 mg/dL — ABNORMAL LOW (ref 0.3–1.2)

## 2010-12-21 LAB — CBC
HCT: 29.8 % — ABNORMAL LOW (ref 39.0–52.0)
Hemoglobin: 9.5 g/dL — ABNORMAL LOW (ref 13.0–17.0)
RBC: 3.46 MIL/uL — ABNORMAL LOW (ref 4.22–5.81)
WBC: 10.2 10*3/uL (ref 4.0–10.5)

## 2010-12-21 LAB — DIFFERENTIAL
Basophils Relative: 1 % (ref 0–1)
Lymphocytes Relative: 33 % (ref 12–46)
Monocytes Absolute: 1.5 10*3/uL — ABNORMAL HIGH (ref 0.1–1.0)
Monocytes Relative: 15 % — ABNORMAL HIGH (ref 3–12)
Neutro Abs: 5.3 10*3/uL (ref 1.7–7.7)
Neutrophils Relative %: 51 % (ref 43–77)

## 2010-12-21 MED ORDER — LEUCOVORIN CALCIUM INJECTION 100 MG
33.0000 mg | Freq: Once | INTRAMUSCULAR | Status: AC
  Administered 2010-12-21: 34 mg via INTRAVENOUS
  Filled 2010-12-21: qty 1.7

## 2010-12-21 MED ORDER — LORAZEPAM 2 MG/ML IJ SOLN
INTRAMUSCULAR | Status: AC
  Filled 2010-12-21: qty 1

## 2010-12-21 MED ORDER — LEUCOVORIN CALCIUM INJECTION 350 MG: 33.0000 mg | Freq: Once | INTRAMUSCULAR | Status: DC

## 2010-12-21 MED ORDER — SODIUM CHLORIDE 0.9 % IV SOLN
Freq: Once | INTRAVENOUS | Status: AC
  Administered 2010-12-21: 24 mg via INTRAVENOUS
  Filled 2010-12-21: qty 12

## 2010-12-21 MED ORDER — ONDANSETRON 8 MG/50ML IVPB (CHCC): 24.0000 mg | Freq: Once | INTRAVENOUS | Status: DC

## 2010-12-21 MED ORDER — DEXAMETHASONE SODIUM PHOSPHATE 10 MG/ML IJ SOLN: 12.0000 mg | Freq: Once | INTRAMUSCULAR | Status: DC

## 2010-12-21 MED ORDER — SODIUM CHLORIDE 0.9 % IV SOLN
2000.0000 mg/m2 | INTRAVENOUS | Status: DC
  Administered 2010-12-21: 3400 mg via INTRAVENOUS
  Filled 2010-12-21 (×2): qty 68

## 2010-12-21 MED ORDER — OXALIPLATIN CHEMO INJECTION 100 MG/20ML
72.2500 mg/m2 | Freq: Once | INTRAVENOUS | Status: AC
  Administered 2010-12-21: 125 mg via INTRAVENOUS
  Filled 2010-12-21: qty 25

## 2010-12-21 MED ORDER — LORAZEPAM 2 MG/ML IJ SOLN
0.5000 mg | Freq: Once | INTRAMUSCULAR | Status: AC
  Administered 2010-12-21: 0.5 mg via INTRAVENOUS

## 2010-12-21 MED ORDER — SODIUM CHLORIDE 0.9 % IJ SOLN
10.0000 mL | INTRAMUSCULAR | Status: DC | PRN
  Filled 2010-12-21: qty 10

## 2010-12-21 MED ORDER — SODIUM CHLORIDE 0.9 % IV SOLN
INTRAVENOUS | Status: DC
  Administered 2010-12-21: 12:00:00 via INTRAVENOUS

## 2010-12-21 MED ORDER — FLUOROURACIL CHEMO INJECTION 2.5 GM/50ML
660.0000 mg | Freq: Once | INTRAVENOUS | Status: AC
  Administered 2010-12-21: 650 mg via INTRAVENOUS
  Filled 2010-12-21: qty 13

## 2010-12-21 MED ORDER — DEXTROSE 5 % IV SOLN
INTRAVENOUS | Status: DC
  Administered 2010-12-21: 13:00:00 via INTRAVENOUS
  Filled 2010-12-21 (×9): qty 1000

## 2010-12-21 MED ORDER — HEPARIN SOD (PORK) LOCK FLUSH 100 UNIT/ML IV SOLN
500.0000 [IU] | Freq: Once | INTRAVENOUS | Status: DC | PRN
  Filled 2010-12-21: qty 5

## 2010-12-21 NOTE — Progress Notes (Signed)
[name],ERIC, MD, MD [address] [place] [address]  1. Adenocarcinoma of colon with mucinous features  CBC, Differential, Comprehensive metabolic panel, CEA, CBC, Differential, Comprehensive metabolic panel, CEA, dexlansoprazole (DEXILANT) 60 MG capsule, HYDROcodone-acetaminophen (NORCO) 5-325 MG per tablet, LORazepam (ATIVAN) 2 MG/ML injection  2. Bleeding gastrointestinal  CBC, Differential, Comprehensive metabolic panel, CEA, CBC, Differential, Comprehensive metabolic panel, CEA, dexlansoprazole (DEXILANT) 60 MG capsule, HYDROcodone-acetaminophen (NORCO) 5-325 MG per tablet, LORazepam (ATIVAN) 2 MG/ML injection  3. S/P partial gastrectomy      CURRENT THERAPY: Status post surgical resection by Dr. [name] on 09/04/2010. Status post 5 cycles of FOLFOX chemotherapy. He is here today to embark on cycle 6 of chemotherapy.  INTERVAL HISTORY: [name] 60 y.o. male returns for  regular  visit for followup of stage IIIB colon cancer.  The patient was brought in today with his wife accompanying him to discuss some problems that are going on at home including narcotic usage, poor hygiene, and to discuss continuation of chemotherapy.  The patient reports that he is finishing an antibiotic for pneumonia. He is taking levofloxacin.  The first topic of discussion was his narcotic usage. Patient reports he takes 2-3 Dilaudid pills daily. He reports that this controls his pain well. Occasionally he has increases in pain and therefore is not recorded that he has taken up to 6 or 8 Dilaudid tablets.  The concern about his narcotic usage is warranted due to his past history of alcoholism and alcohol abuse. If his pain continues to not be controlled with 2-3 Dilaudid tablets, he may need a referral to a pain management clinic. His abdominal pain is not associated with his cancer. He is receiving chemotherapy in the adjuvant setting. His abdominal pain is likely secondary to multiple  abdominal procedures.  The second topic of discussion included his hygiene. The patient reports that when he was feeling ill with his pneumonia, he was not taking baths as expected. Today the patient is seen well kempt and clean and shaven.  The patient's weight has stabilized after a 20 pound weight loss. He weighs approximately 110 pounds. He reports he does have an appetite although he continues to experience early satiety. This too may be secondary to his gastric operations in the past.  At this point in time we will continue with chemotherapy with a 10-15% dose reduction of the xaliplatin and 5FU continuous infusion. We hope that the patient does not encounter any more problems requiring hospitalization. However if this does occur we will likely discontinue chemotherapy.  Past Medical History  Diagnosis Date  . 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 Pulmonary embolism 02/2010  . Acid reflux   . Hypertension   . Osteoporosis   . Arthritis   . TIA (transient ischemic attack) 10/11  . ETOH abuse     quit 03/2010  . 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 Personal history of PE (pulmonary embolism) 10/01/2010  . Blood transfusion   . S/P endoscopy September 28, 2010    erosive reflux esophagitis, Billroth I anatomy    has Adenocarcinoma of colon with mucinous features; Bleeding gastrointestinal; Supratherapeutic INR; Abdominal  pain, other specified site; Nausea and vomiting in adult; Anemia, chronic disease; Fever chills; S/P partial gastrectomy; Personal history of PE (pulmonary embolism); Bronchitis, acute; Erosive esophagitis; Esophageal reflux disease; Coffee ground emesis; Hematemesis; Chronic abdominal pain; Pancytopenia due to antineoplastic chemotherapy; and Pneumonia on his problem list.  has no known allergies.  [name] does not currently have medications on file.  Past Surgical History  Procedure Date  . Hernia repair     right inguinal  .  Appendectomy 1980s  . Cholecystectomy 1980s  . Colon surgery May 2012    left  . Portacath placement   . Abdominal sugery     for bowel obstruction x 8  . Esophagogastroduodenoscopy 09/28/2010    Procedure: ESOPHAGOGASTRODUODENOSCOPY (EGD);  Surgeon: Corbin Ade, MD;  Location: [facility];  Service: Endoscopy;  Laterality: N/A;  . Esophagogastroduodenoscopy 12/01/2010    Procedure: ESOPHAGOGASTRODUODENOSCOPY (EGD);  Surgeon: Corbin Ade, MD;  Location: [facility];  Service: Endoscopy;  Laterality: N/A;  3:00    Denies any headaches, dizziness, double vision, fevers, chills, night sweats, nausea, vomiting, diarrhea, constipation, chest pain, heart palpitations, shortness of breath, blood in stool, black tarry stool, urinary pain, urinary burning, urinary frequency, hematuria.   PHYSICAL EXAMINATION  ECOG PERFORMANCE STATUS: 2 - Symptomatic, <50% confined to bed  Filed Vitals:   12/21/10 0957  BP: 119/81  Pulse: 89  Temp: 98 F (36.7 C)    GENERAL:alert, no distress, well developed, cachectic and cooperative SKIN: skin color, texture, turgor are normal HEAD: Normocephalic EYES: normal EARS: External ears normal OROPHARYNX:mucous membranes are moist  NECK: trachea midline LYMPH:  not examined BREAST:not examined LUNGS: clear to auscultation  HEART: regular rate & rhythm, no murmurs and no gallops ABDOMEN:abdomen soft, non-tender and normal bowel sounds BACK: Back symmetric, no curvature. EXTREMITIES:no edema, no skin discoloration, no clubbing, no cyanosis  NEURO: alert & oriented x 3 with fluent speech, no focal motor/sensory deficits, gait normal  LABORATORY DATA: CBC    Component Value Date/Time   WBC 10.2 12/21/2010 0957   RBC 3.46* 12/21/2010 0957   HGB 9.5* 12/21/2010 0957   HCT 29.8* 12/21/2010 0957   PLT 317 12/21/2010 0957   MCV 86.1 12/21/2010 0957   MCH 27.5 12/21/2010 0957   MCHC 31.9 12/21/2010 0957   RDW 22.1* 12/21/2010 0957   LYMPHSABS 3.4  12/21/2010 0957   MONOABS 1.5* 12/21/2010 0957   EOSABS 0.0 12/21/2010 0957   BASOSABS 0.1 12/21/2010 0957      Chemistry      Component Value Date/Time   NA 137 12/21/2010 0957   K 3.6 12/21/2010 0957   CL 106 12/21/2010 0957   CO2 18* 12/21/2010 0957   BUN 7 12/21/2010 0957   CREATININE 0.79 12/21/2010 0957      Component Value Date/Time   CALCIUM 8.9 12/21/2010 0957   ALKPHOS 89 12/21/2010 0957   AST 11 12/21/2010 0957   ALT 5 12/21/2010 0957   BILITOT 0.2* 12/21/2010 0957        ASSESSMENT:  1. Stage IIIB colon cancer, status post resection and now status post 5 cycles of FOLFOX chemotherapy. 2. History of alcohol abuse status post detox in January 2012. Abstinent since then. 3. Abdominal pain secondary to multiple abdominal surgeries creating adhesions and scarring. 4. Weight loss 5. History of pulmonary embolism diagnosed in December of 2011. 6. Partial gastrectomy 7. Iron deficiency anemia, status post Feraheme infusion as an inpatient in September of 2012. 8. Questionable failure to thrive, improved.   PLAN:  1. Patient will move onto cycle 6 of FOLFOX chemotherapy today with a 10-15% dose reduction of oxaliplatin and 5-FU continuous infusion. 2. We'll continue to monitor the patient's narcotic usage. 3. If the patient utilizes more narcotics then deemed necessary, the patient  may require a pain management referral for treatment of his chronic pain. 4. Patient will return to clinic in one month's time for followup. 5. More than 50% of the time spent with the patient was utilized for counseling.   All questions were answered. The patient knows to call the clinic with any problems, questions or concerns. We can certainly see the patient much sooner if necessary.  The patient and plan discussed with Glenford Peers, MD and he is in agreement with the aforementioned.  I spent 40 minutes counseling the patient face to face. The total time spent in the appointment was 50  minutes.  KEFALAS,THOMAS

## 2010-12-22 ENCOUNTER — Other Ambulatory Visit (HOSPITAL_COMMUNITY): Payer: Self-pay | Admitting: Oncology

## 2010-12-23 ENCOUNTER — Encounter (HOSPITAL_BASED_OUTPATIENT_CLINIC_OR_DEPARTMENT_OTHER): Payer: Federal, State, Local not specified - PPO

## 2010-12-23 DIAGNOSIS — Z452 Encounter for adjustment and management of vascular access device: Secondary | ICD-10-CM

## 2010-12-23 DIAGNOSIS — C189 Malignant neoplasm of colon, unspecified: Secondary | ICD-10-CM

## 2010-12-23 MED ORDER — HEPARIN SOD (PORK) LOCK FLUSH 100 UNIT/ML IV SOLN
500.0000 [IU] | Freq: Once | INTRAVENOUS | Status: AC
  Administered 2010-12-23: 500 [IU] via INTRAVENOUS
  Filled 2010-12-23: qty 5

## 2010-12-23 MED ORDER — SODIUM CHLORIDE 0.9 % IJ SOLN
10.0000 mL | INTRAMUSCULAR | Status: DC | PRN
  Administered 2010-12-23: 10 mL via INTRAVENOUS
  Filled 2010-12-23: qty 10

## 2010-12-23 MED ORDER — SODIUM CHLORIDE 0.9 % IJ SOLN
INTRAMUSCULAR | Status: AC
  Filled 2010-12-23: qty 10

## 2010-12-23 MED ORDER — HEPARIN SOD (PORK) LOCK FLUSH 100 UNIT/ML IV SOLN
INTRAVENOUS | Status: AC
  Filled 2010-12-23: qty 5

## 2011-01-04 ENCOUNTER — Encounter (HOSPITAL_BASED_OUTPATIENT_CLINIC_OR_DEPARTMENT_OTHER): Payer: Federal, State, Local not specified - PPO

## 2011-01-04 VITALS — BP 130/82 | HR 59 | Temp 98.2°F | Ht 68.0 in | Wt 120.8 lb

## 2011-01-04 DIAGNOSIS — Z5111 Encounter for antineoplastic chemotherapy: Secondary | ICD-10-CM

## 2011-01-04 DIAGNOSIS — C189 Malignant neoplasm of colon, unspecified: Secondary | ICD-10-CM

## 2011-01-04 LAB — COMPREHENSIVE METABOLIC PANEL WITH GFR
ALT: 7 U/L (ref 0–53)
AST: 15 U/L (ref 0–37)
Albumin: 3 g/dL — ABNORMAL LOW (ref 3.5–5.2)
Alkaline Phosphatase: 90 U/L (ref 39–117)
BUN: 7 mg/dL (ref 6–23)
CO2: 22 meq/L (ref 19–32)
Calcium: 8.9 mg/dL (ref 8.4–10.5)
Chloride: 101 meq/L (ref 96–112)
Creatinine, Ser: 0.68 mg/dL (ref 0.50–1.35)
GFR calc Af Amer: >90 mL/min
GFR calc non Af Amer: >90 mL/min
Glucose, Bld: 111 mg/dL — ABNORMAL HIGH (ref 70–99)
Potassium: 3.9 meq/L (ref 3.5–5.1)
Sodium: 133 meq/L — ABNORMAL LOW (ref 135–145)
Total Bilirubin: 0.3 mg/dL (ref 0.3–1.2)
Total Protein: 5.9 g/dL — ABNORMAL LOW (ref 6.0–8.3)

## 2011-01-04 LAB — CBC
HCT: 27.2 % — ABNORMAL LOW (ref 39.0–52.0)
MCHC: 33.8 g/dL (ref 30.0–36.0)
MCV: 88.9 fL (ref 78.0–100.0)
Platelets: 191 10*3/uL (ref 150–400)
RDW: 24.3 % — ABNORMAL HIGH (ref 11.5–15.5)
WBC: 4.2 10*3/uL (ref 4.0–10.5)

## 2011-01-04 LAB — DIFFERENTIAL
Basophils Absolute: 0 10*3/uL (ref 0.0–0.1)
Basophils Relative: 0 % (ref 0–1)
Eosinophils Absolute: 0.1 10*3/uL (ref 0.0–0.7)
Eosinophils Relative: 2 % (ref 0–5)
Lymphocytes Relative: 34 % (ref 12–46)
Lymphs Abs: 1.4 10*3/uL (ref 0.7–4.0)
Monocytes Absolute: 0.7 10*3/uL (ref 0.1–1.0)
Monocytes Relative: 16 % — ABNORMAL HIGH (ref 3–12)
Neutro Abs: 2 10*3/uL (ref 1.7–7.7)
Neutrophils Relative %: 48 % (ref 43–77)

## 2011-01-04 MED ORDER — LORAZEPAM 2 MG/ML IJ SOLN
INTRAMUSCULAR | Status: AC
  Administered 2011-01-04: 0.5 mg via INTRAVENOUS
  Filled 2011-01-04: qty 1

## 2011-01-04 MED ORDER — SODIUM CHLORIDE 0.9 % IJ SOLN
INTRAMUSCULAR | Status: AC
  Administered 2011-01-04: 10 mL
  Filled 2011-01-04: qty 10

## 2011-01-04 MED ORDER — SODIUM CHLORIDE 0.9 % IV SOLN
INTRAVENOUS | Status: DC
  Administered 2011-01-04: 10:00:00 via INTRAVENOUS

## 2011-01-04 MED ORDER — DEXTROSE 5 % IV BOLUS
50.0000 mL | INTRAVENOUS | Status: AC
  Filled 2011-01-04 (×2): qty 50

## 2011-01-04 MED ORDER — DEXTROSE 5 % IV SOLN: INTRAVENOUS | Status: DC

## 2011-01-04 MED ORDER — LEUCOVORIN CALCIUM INJECTION 350 MG: 33.0000 mg | Freq: Once | INTRAVENOUS | Status: DC

## 2011-01-04 MED ORDER — FLUOROURACIL CHEMO INJECTION 2.5 GM/50ML
660.0000 mg | Freq: Once | INTRAVENOUS | Status: AC
  Administered 2011-01-04: 650 mg via INTRAVENOUS
  Filled 2011-01-04 (×2): qty 13

## 2011-01-04 MED ORDER — ONDANSETRON 8 MG/50ML IVPB (CHCC): 24.0000 mg | Freq: Once | INTRAVENOUS | Status: DC

## 2011-01-04 MED ORDER — DEXAMETHASONE SODIUM PHOSPHATE 10 MG/ML IJ SOLN: 12.0000 mg | Freq: Once | INTRAMUSCULAR | Status: DC

## 2011-01-04 MED ORDER — SODIUM CHLORIDE 0.9 % IV SOLN
Freq: Once | INTRAVENOUS | Status: AC
  Administered 2011-01-04: 24 mg via INTRAVENOUS
  Filled 2011-01-04: qty 12

## 2011-01-04 MED ORDER — SODIUM CHLORIDE 0.9 % IJ SOLN
10.0000 mL | INTRAMUSCULAR | Status: DC | PRN
  Administered 2011-01-04 (×2): 10 mL
  Filled 2011-01-04: qty 10

## 2011-01-04 MED ORDER — LEUCOVORIN CALCIUM INJECTION 100 MG
34.0000 mg | Freq: Once | INTRAMUSCULAR | Status: AC
  Administered 2011-01-04: 34 mg via INTRAVENOUS
  Filled 2011-01-04 (×2): qty 1.7

## 2011-01-04 MED ORDER — SODIUM CHLORIDE 0.9 % IV SOLN
2000.0000 mg/m2 | INTRAVENOUS | Status: DC
  Administered 2011-01-04: 3400 mg via INTRAVENOUS
  Filled 2011-01-04 (×2): qty 68

## 2011-01-04 MED ORDER — LORAZEPAM 2 MG/ML IJ SOLN
0.5000 mg | Freq: Once | INTRAMUSCULAR | Status: AC
  Administered 2011-01-04: 0.5 mg via INTRAVENOUS

## 2011-01-04 MED ORDER — OXALIPLATIN CHEMO INJECTION 100 MG/20ML
72.2500 mg/m2 | Freq: Once | INTRAVENOUS | Status: AC
  Administered 2011-01-04: 125 mg via INTRAVENOUS
  Filled 2011-01-04: qty 25

## 2011-01-04 MED ORDER — HEPARIN SOD (PORK) LOCK FLUSH 100 UNIT/ML IV SOLN
500.0000 [IU] | Freq: Once | INTRAVENOUS | Status: DC | PRN
  Filled 2011-01-04: qty 5

## 2011-01-06 ENCOUNTER — Other Ambulatory Visit: Payer: Self-pay

## 2011-01-06 ENCOUNTER — Encounter (HOSPITAL_BASED_OUTPATIENT_CLINIC_OR_DEPARTMENT_OTHER): Payer: Federal, State, Local not specified - PPO | Admitting: Oncology

## 2011-01-06 ENCOUNTER — Encounter (HOSPITAL_BASED_OUTPATIENT_CLINIC_OR_DEPARTMENT_OTHER): Payer: Federal, State, Local not specified - PPO

## 2011-01-06 ENCOUNTER — Ambulatory Visit (HOSPITAL_COMMUNITY)
Admission: RE | Admit: 2011-01-06 | Discharge: 2011-01-06 | Disposition: A | Discharge status: Home / Self Care | Payer: Federal, State, Local not specified - PPO | Source: Ambulatory Visit | Attending: Oncology | Admitting: Oncology

## 2011-01-06 VITALS — BP 167/104 | HR 94 | Temp 97.9°F

## 2011-01-06 DIAGNOSIS — C189 Malignant neoplasm of colon, unspecified: Secondary | ICD-10-CM

## 2011-01-06 DIAGNOSIS — R05 Cough: Secondary | ICD-10-CM

## 2011-01-06 DIAGNOSIS — R058 Other specified cough: Secondary | ICD-10-CM

## 2011-01-06 DIAGNOSIS — R079 Chest pain, unspecified: Secondary | ICD-10-CM

## 2011-01-06 DIAGNOSIS — R059 Cough, unspecified: Secondary | ICD-10-CM

## 2011-01-06 DIAGNOSIS — R509 Fever, unspecified: Secondary | ICD-10-CM | POA: Insufficient documentation

## 2011-01-06 DIAGNOSIS — Z452 Encounter for adjustment and management of vascular access device: Secondary | ICD-10-CM

## 2011-01-06 MED ORDER — HEPARIN SOD (PORK) LOCK FLUSH 100 UNIT/ML IV SOLN
500.0000 [IU] | Freq: Once | INTRAVENOUS | Status: AC | PRN
  Administered 2011-01-06: 500 [IU]
  Filled 2011-01-06: qty 5

## 2011-01-06 MED ORDER — AMOXICILLIN-POT CLAVULANATE 875-125 MG PO TABS: 1.0000 | ORAL_TABLET | Freq: Two times a day (BID) | ORAL | Status: AC

## 2011-01-06 MED ORDER — SODIUM CHLORIDE 0.9 % IJ SOLN
10.0000 mL | INTRAMUSCULAR | Status: DC | PRN
  Administered 2011-01-06: 10 mL
  Filled 2011-01-06: qty 10

## 2011-01-06 MED ORDER — SODIUM CHLORIDE 0.9 % IJ SOLN
INTRAMUSCULAR | Status: AC
  Filled 2011-01-06: qty 10

## 2011-01-06 MED ORDER — HEPARIN SOD (PORK) LOCK FLUSH 100 UNIT/ML IV SOLN
INTRAVENOUS | Status: AC
  Filled 2011-01-06: qty 5

## 2011-01-06 NOTE — Progress Notes (Signed)
Subjective: Patient is here to get his 5-FU continuous infusion pump disconnected. Upon disconnection, he asks the nurse to keep his port accessed as he plans on going to the emergency room. This of course is concerning, therefore the nurse asked me to see the patient today.  The patient reports that he believes he has pneumonia and therefore was planning on going to the emergency room following his disconnection from the continuous infusion pump. He reports that he has a left-sided low thorax discomfort. He reports that this has been ongoing for a number of days. He does report a cough productive of yellow sputum. He admits to fevers at home reaching 101F, however today in the clinic he was 97.22F.  Objective: Gen.: Patient seen sitting in a recliner in the chemotherapy administration room. He does not appear to be in acute distress. He is alert and oriented x3. His wife accompanies him to this visit today. Cachectic. Smiling. HEENT: atraumatic, normocephalic, anicteric sclerae Cardiac: Regular rate and rhythm without murmur rub or gallop. No S3 or S4 appreciated. Lungs: Clear to auscultation bilaterally without wheezes rales or rhonchi. No sister must use appreciated. Abdomen: positive bowel sounds in all 4 quadrants.  Assessment: 1. Pleuritic left sided chest pain 2. Cough 3. Production of yellow sputum 4. Stage III colon cancer status post resection now receiving chemotherapy  Plan: 1. EKG now, normal sinus rhythm with occasional PACs. 2. I took the EKG to the [facility] cardiology group. I spoke to the nurse practitioner there who verified that was found on the EKG is benign PACs. 3. Chest x-ray today, reveals near complete resolution of left lower lobe airspace disease. No new abnormalities. 4. A prescription was E. scribed to [facility] in [place] for Augmentin 875/125 mg #14 with no refills. 5. patient will call the clinic in one weeks time to let us know how he is feeling. He will  call us if she begins feeling worse as well.  KEFALAS,THOMAS

## 2011-01-17 ENCOUNTER — Encounter (HOSPITAL_COMMUNITY): Payer: Self-pay | Admitting: *Deleted

## 2011-01-17 ENCOUNTER — Other Ambulatory Visit: Payer: Self-pay

## 2011-01-17 ENCOUNTER — Emergency Department (HOSPITAL_COMMUNITY): Payer: Federal, State, Local not specified - PPO

## 2011-01-17 ENCOUNTER — Inpatient Hospital Stay (HOSPITAL_COMMUNITY)
Admission: EM | Admit: 2011-01-17 | Discharge: 2011-01-22 | DRG: 541 | Disposition: A | Discharge status: Home / Self Care | Payer: Federal, State, Local not specified - PPO | Attending: Internal Medicine | Admitting: Internal Medicine

## 2011-01-17 DIAGNOSIS — K922 Gastrointestinal hemorrhage, unspecified: Secondary | ICD-10-CM

## 2011-01-17 DIAGNOSIS — F6089 Other specific personality disorders: Secondary | ICD-10-CM | POA: Diagnosis present

## 2011-01-17 DIAGNOSIS — D638 Anemia in other chronic diseases classified elsewhere: Secondary | ICD-10-CM | POA: Diagnosis present

## 2011-01-17 DIAGNOSIS — G8929 Other chronic pain: Secondary | ICD-10-CM

## 2011-01-17 DIAGNOSIS — E872 Acidosis, unspecified: Secondary | ICD-10-CM | POA: Diagnosis present

## 2011-01-17 DIAGNOSIS — R791 Abnormal coagulation profile: Secondary | ICD-10-CM

## 2011-01-17 DIAGNOSIS — C189 Malignant neoplasm of colon, unspecified: Secondary | ICD-10-CM

## 2011-01-17 DIAGNOSIS — R509 Fever, unspecified: Secondary | ICD-10-CM

## 2011-01-17 DIAGNOSIS — Z903 Acquired absence of stomach [part of]: Secondary | ICD-10-CM

## 2011-01-17 DIAGNOSIS — K219 Gastro-esophageal reflux disease without esophagitis: Secondary | ICD-10-CM

## 2011-01-17 DIAGNOSIS — R112 Nausea with vomiting, unspecified: Secondary | ICD-10-CM

## 2011-01-17 DIAGNOSIS — T451X5A Adverse effect of antineoplastic and immunosuppressive drugs, initial encounter: Secondary | ICD-10-CM

## 2011-01-17 DIAGNOSIS — K2210 Ulcer of esophagus without bleeding: Secondary | ICD-10-CM

## 2011-01-17 DIAGNOSIS — J189 Pneumonia, unspecified organism: Secondary | ICD-10-CM

## 2011-01-17 DIAGNOSIS — R079 Chest pain, unspecified: Secondary | ICD-10-CM | POA: Diagnosis present

## 2011-01-17 DIAGNOSIS — Z9221 Personal history of antineoplastic chemotherapy: Secondary | ICD-10-CM

## 2011-01-17 DIAGNOSIS — E876 Hypokalemia: Secondary | ICD-10-CM | POA: Diagnosis present

## 2011-01-17 DIAGNOSIS — K920 Hematemesis: Secondary | ICD-10-CM

## 2011-01-17 DIAGNOSIS — D61810 Antineoplastic chemotherapy induced pancytopenia: Secondary | ICD-10-CM

## 2011-01-17 DIAGNOSIS — D61818 Other pancytopenia: Secondary | ICD-10-CM | POA: Diagnosis present

## 2011-01-17 DIAGNOSIS — Z86711 Personal history of pulmonary embolism: Secondary | ICD-10-CM

## 2011-01-17 DIAGNOSIS — R109 Unspecified abdominal pain: Secondary | ICD-10-CM

## 2011-01-17 DIAGNOSIS — J209 Acute bronchitis, unspecified: Secondary | ICD-10-CM

## 2011-01-17 DIAGNOSIS — I2699 Other pulmonary embolism without acute cor pulmonale: Principal | ICD-10-CM

## 2011-01-17 HISTORY — DX: Anemia, unspecified: D64.9

## 2011-01-17 HISTORY — DX: Acute upper respiratory infection, unspecified: J06.9

## 2011-01-17 HISTORY — DX: Pneumonia, unspecified organism: J18.9

## 2011-01-17 HISTORY — DX: Anxiety disorder, unspecified: F41.9

## 2011-01-17 LAB — HEPATIC FUNCTION PANEL
ALT: 7 U/L (ref 0–53)
AST: 20 U/L (ref 0–37)
Albumin: 3.6 g/dL (ref 3.5–5.2)
Alkaline Phosphatase: 108 U/L (ref 39–117)
Bilirubin, Direct: <0.1 mg/dL (ref 0.0–0.3)
Total Bilirubin: 0.3 mg/dL (ref 0.3–1.2)

## 2011-01-17 LAB — URINALYSIS, ROUTINE W REFLEX MICROSCOPIC
Glucose, UA: NEGATIVE mg/dL
Hgb urine dipstick: NEGATIVE
Ketones, ur: NEGATIVE mg/dL
Protein, ur: NEGATIVE mg/dL
Urobilinogen, UA: 0.2 mg/dL (ref 0.0–1.0)

## 2011-01-17 LAB — POCT I-STAT TROPONIN I
Troponin i, poc: 0 ng/mL (ref 0.00–0.08)
Troponin i, poc: 0 ng/mL (ref 0.00–0.08)

## 2011-01-17 LAB — CBC
HCT: 30.8 % — ABNORMAL LOW (ref 39.0–52.0)
Hemoglobin: 10.7 g/dL — ABNORMAL LOW (ref 13.0–17.0)
MCH: 31.3 pg (ref 26.0–34.0)
MCHC: 34.7 g/dL (ref 30.0–36.0)
RBC: 3.42 MIL/uL — ABNORMAL LOW (ref 4.22–5.81)

## 2011-01-17 LAB — BASIC METABOLIC PANEL
BUN: 3 mg/dL — ABNORMAL LOW (ref 6–23)
CO2: 17 mEq/L — ABNORMAL LOW (ref 19–32)
Calcium: 9.2 mg/dL (ref 8.4–10.5)
GFR calc non Af Amer: >90 mL/min (ref >90)
Glucose, Bld: 111 mg/dL — ABNORMAL HIGH (ref 70–99)

## 2011-01-17 LAB — CARDIAC PANEL(CRET KIN+CKTOT+MB+TROPI): Troponin I: <0.3 ng/mL (ref <0.30)

## 2011-01-17 LAB — PROTIME-INR: Prothrombin Time: 14.3 seconds (ref 11.6–15.2)

## 2011-01-17 LAB — D-DIMER, QUANTITATIVE: D-Dimer, Quant: 14.48 ug/mL-FEU — ABNORMAL HIGH (ref 0.00–0.48)

## 2011-01-17 MED ORDER — PIPERACILLIN-TAZOBACTAM 3.375 G IVPB
3.3750 g | Freq: Once | INTRAVENOUS | Status: AC
  Administered 2011-01-17: 3.375 g via INTRAVENOUS
  Filled 2011-01-17: qty 50

## 2011-01-17 MED ORDER — PIPERACILLIN-TAZOBACTAM 3.375 G IVPB
INTRAVENOUS | Status: AC
  Filled 2011-01-17: qty 100

## 2011-01-17 MED ORDER — MEGESTROL ACETATE 400 MG/10ML PO SUSP
ORAL | Status: AC
  Filled 2011-01-17: qty 10

## 2011-01-17 MED ORDER — HYDROMORPHONE HCL 2 MG PO TABS
2.0000 mg | ORAL_TABLET | Freq: Four times a day (QID) | ORAL | Status: DC | PRN
  Administered 2011-01-17: 2 mg via ORAL

## 2011-01-17 MED ORDER — HYDROMORPHONE HCL 1 MG/ML IJ SOLN
1.0000 mg | Freq: Four times a day (QID) | INTRAMUSCULAR | Status: DC | PRN
  Administered 2011-01-18 (×3): 1 mg via INTRAVENOUS
  Filled 2011-01-17 (×3): qty 1

## 2011-01-17 MED ORDER — FOLIC ACID 1 MG PO TABS
1.0000 mg | ORAL_TABLET | Freq: Every day | ORAL | Status: DC
  Administered 2011-01-18 – 2011-01-22 (×5): 1 mg via ORAL
  Filled 2011-01-17 (×5): qty 1

## 2011-01-17 MED ORDER — ASPIRIN 81 MG PO CHEW
324.0000 mg | CHEWABLE_TABLET | Freq: Once | ORAL | Status: AC
Start: 2011-01-17 — End: 2011-01-17
  Administered 2011-01-17: 324 mg via ORAL

## 2011-01-17 MED ORDER — OLMESARTAN MEDOXOMIL 5 MG PO TABS
5.0000 mg | ORAL_TABLET | Freq: Every day | ORAL | Status: DC
  Administered 2011-01-18 – 2011-01-22 (×5): 5 mg via ORAL
  Filled 2011-01-17 (×7): qty 1

## 2011-01-17 MED ORDER — MEGESTROL ACETATE 40 MG/ML PO SUSP
200.0000 mg | Freq: Two times a day (BID) | ORAL | Status: DC
  Administered 2011-01-17: 19:00:00 via ORAL
  Filled 2011-01-17 (×2): qty 5

## 2011-01-17 MED ORDER — CYCLOBENZAPRINE HCL 10 MG PO TABS
10.0000 mg | ORAL_TABLET | ORAL | Status: DC | PRN
  Administered 2011-01-22: 10 mg via ORAL
  Filled 2011-01-17: qty 1

## 2011-01-17 MED ORDER — IOHEXOL 350 MG/ML SOLN
100.0000 mL | Freq: Once | INTRAVENOUS | Status: AC | PRN
  Administered 2011-01-17: 100 mL via INTRAVENOUS

## 2011-01-17 MED ORDER — ASPIRIN 81 MG PO CHEW
CHEWABLE_TABLET | ORAL | Status: AC
  Administered 2011-01-17: 324 mg via ORAL
  Filled 2011-01-17: qty 4

## 2011-01-17 MED ORDER — HYDROMORPHONE HCL 1 MG/ML IJ SOLN: 1.0000 mg | Freq: Four times a day (QID) | INTRAMUSCULAR | Status: DC | PRN

## 2011-01-17 MED ORDER — HEPARIN (PORCINE) IN NACL 100-0.45 UNIT/ML-% IJ SOLN: 14.0000 [IU]/kg/h | Freq: Once | INTRAMUSCULAR | Status: DC

## 2011-01-17 MED ORDER — SERTRALINE HCL 50 MG PO TABS
50.0000 mg | ORAL_TABLET | Freq: Every day | ORAL | Status: DC
  Administered 2011-01-17 – 2011-01-21 (×5): 50 mg via ORAL
  Filled 2011-01-17 (×6): qty 1

## 2011-01-17 MED ORDER — SODIUM CHLORIDE 0.9 % IV SOLN: INTRAVENOUS | Status: AC

## 2011-01-17 MED ORDER — NITROGLYCERIN 0.4 MG SL SUBL
SUBLINGUAL_TABLET | SUBLINGUAL | Status: AC
  Administered 2011-01-17: 0.4 mg via SUBLINGUAL
  Filled 2011-01-17: qty 25

## 2011-01-17 MED ORDER — HEPARIN (PORCINE) IN NACL 100-0.45 UNIT/ML-% IJ SOLN
14.0000 [IU]/kg/h | Freq: Once | INTRAMUSCULAR | Status: AC
  Administered 2011-01-17: 14 [IU]/kg/h via INTRAVENOUS
  Filled 2011-01-17: qty 250

## 2011-01-17 MED ORDER — HEPARIN BOLUS VIA INFUSION
4000.0000 [IU] | Freq: Once | INTRAVENOUS | Status: AC
  Administered 2011-01-17: 4000 [IU] via INTRAVENOUS
  Filled 2011-01-17: qty 4000

## 2011-01-17 MED ORDER — VANCOMYCIN HCL 1000 MG IV SOLR
750.0000 mg | Freq: Two times a day (BID) | INTRAVENOUS | Status: DC
  Filled 2011-01-17 (×2): qty 750

## 2011-01-17 MED ORDER — HYDROMORPHONE HCL 2 MG PO TABS: 1.0000 mg | ORAL_TABLET | Freq: Four times a day (QID) | ORAL | Status: DC | PRN

## 2011-01-17 MED ORDER — PIPERACILLIN-TAZOBACTAM 3.375 G IVPB 30 MIN
3.3750 g | Freq: Three times a day (TID) | INTRAVENOUS | Status: DC
  Administered 2011-01-17 – 2011-01-22 (×13): 3.375 g via INTRAVENOUS
  Filled 2011-01-17 (×24): qty 50

## 2011-01-17 MED ORDER — ONDANSETRON HCL 4 MG/2ML IJ SOLN
4.0000 mg | Freq: Once | INTRAMUSCULAR | Status: AC
  Administered 2011-01-17: 4 mg via INTRAVENOUS
  Filled 2011-01-17: qty 2

## 2011-01-17 MED ORDER — PANTOPRAZOLE SODIUM 40 MG PO TBEC
40.0000 mg | DELAYED_RELEASE_TABLET | Freq: Every day | ORAL | Status: DC
  Administered 2011-01-18 – 2011-01-22 (×4): 40 mg via ORAL
  Filled 2011-01-17 (×4): qty 1

## 2011-01-17 MED ORDER — VANCOMYCIN HCL IN DEXTROSE 1-5 GM/200ML-% IV SOLN
1000.0000 mg | Freq: Once | INTRAVENOUS | Status: AC
  Administered 2011-01-17: 1000 mg via INTRAVENOUS
  Filled 2011-01-17: qty 200

## 2011-01-17 MED ORDER — SODIUM CHLORIDE 0.9 % IV SOLN: INTRAVENOUS | Status: DC

## 2011-01-17 MED ORDER — ONDANSETRON HCL 4 MG PO TABS
4.0000 mg | ORAL_TABLET | Freq: Two times a day (BID) | ORAL | Status: DC | PRN
  Administered 2011-01-21: 4 mg via ORAL
  Filled 2011-01-17: qty 1

## 2011-01-17 MED ORDER — NITROGLYCERIN 0.4 MG SL SUBL
0.4000 mg | SUBLINGUAL_TABLET | Freq: Once | SUBLINGUAL | Status: AC
  Administered 2011-01-17 (×2): 0.4 mg via SUBLINGUAL

## 2011-01-17 MED ORDER — ENSURE CLINICAL ST REVIGOR PO LIQD
237.0000 mL | Freq: Two times a day (BID) | ORAL | Status: DC
  Administered 2011-01-18: 17:00:00 via ORAL
  Administered 2011-01-18 – 2011-01-19 (×3): 237 mL via ORAL
  Administered 2011-01-20: 16:00:00 via ORAL
  Administered 2011-01-20 – 2011-01-22 (×3): 237 mL via ORAL
  Filled 2011-01-17: qty 237

## 2011-01-17 MED ORDER — VANCOMYCIN HCL 1000 MG IV SOLR
750.0000 mg | Freq: Two times a day (BID) | INTRAVENOUS | Status: DC
  Administered 2011-01-18: 750 mg via INTRAVENOUS
  Filled 2011-01-17 (×4): qty 750

## 2011-01-17 MED ORDER — SODIUM CHLORIDE 0.9 % IV SOLN
Freq: Once | INTRAVENOUS | Status: AC
  Administered 2011-01-17: 07:00:00 via INTRAVENOUS

## 2011-01-17 MED ORDER — LIDOCAINE-PRILOCAINE 2.5-2.5 % EX CREA
1.0000 "application " | TOPICAL_CREAM | CUTANEOUS | Status: DC | PRN
  Filled 2011-01-17: qty 5

## 2011-01-17 MED ORDER — HYDROMORPHONE HCL 1 MG/ML IJ SOLN
INTRAMUSCULAR | Status: AC
  Administered 2011-01-17: 1 mg via INTRAVENOUS
  Filled 2011-01-17: qty 1

## 2011-01-17 MED ORDER — HYDROMORPHONE HCL 1 MG/ML IJ SOLN
1.0000 mg | Freq: Once | INTRAMUSCULAR | Status: AC
  Administered 2011-01-17 (×2): 1 mg via INTRAVENOUS
  Filled 2011-01-17: qty 1

## 2011-01-17 MED ORDER — FENTANYL 25 MCG/HR TD PT72
25.0000 ug | MEDICATED_PATCH | TRANSDERMAL | Status: DC
  Administered 2011-01-17 – 2011-01-20 (×2): 25 ug via TRANSDERMAL
  Filled 2011-01-17 (×2): qty 1

## 2011-01-17 MED ORDER — LORAZEPAM 1 MG PO TABS: 1.0000 mg | ORAL_TABLET | Freq: Four times a day (QID) | ORAL | Status: DC | PRN

## 2011-01-17 MED ORDER — SODIUM CHLORIDE 0.9 % IJ SOLN
INTRAMUSCULAR | Status: AC
  Filled 2011-01-17: qty 10

## 2011-01-17 MED ORDER — HYDROCODONE-ACETAMINOPHEN 5-325 MG PO TABS
1.0000 | ORAL_TABLET | Freq: Four times a day (QID) | ORAL | Status: DC | PRN
  Administered 2011-01-17 – 2011-01-22 (×13): 1 via ORAL
  Filled 2011-01-17 (×17): qty 1

## 2011-01-17 MED ORDER — SENNOSIDES-DOCUSATE SODIUM 8.6-50 MG PO TABS
1.0000 | ORAL_TABLET | Freq: Every day | ORAL | Status: DC | PRN
  Filled 2011-01-17: qty 1

## 2011-01-17 MED ORDER — HYDROMORPHONE HCL 2 MG PO TABS
ORAL_TABLET | ORAL | Status: AC
  Filled 2011-01-17: qty 1

## 2011-01-17 NOTE — Progress Notes (Signed)
ANTICOAGULATION CONSULT NOTE - Consult  Pharmacy Consult for  Intravenous Heparin drip Indication: chest pain/ACS                    Rule out VTE No Known Allergies  Patient Measurements: Height: 5\' 8"  (172.7 cm) Weight: 120 lb (54.432 kg) IBW/kg (Calculated) : 68.4  Adjusted Body Weight:n/a  Vital Signs: Temp: 98 F (36.7 C) (10/28 0644) Temp src: Oral (10/28 0644) BP: 127/78 mmHg (10/28 1350) Pulse Rate: 79  (10/28 1350)  Labs:  Basename 01/17/11 1001 01/17/11 0655 01/17/11 0653  HGB -- -- 10.7*  HCT -- -- 30.8*  PLT -- -- 122*  APTT -- -- --  LABPROT -- 14.3 --  INR -- 1.09 --  HEPARINUNFRC -- -- --  CREATININE -- -- 0.82  CKTOTAL -- -- 110  CKMB -- -- 2.1  TROPONINI <0.30 -- <0.30   Estimated Creatinine Clearance: 74.6 ml/min (by C-G formula based on Cr of 0.82).  Medical History: Past Medical History  Diagnosis Date  . 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 Pulmonary embolism 02/2010  . Acid reflux   . Hypertension   . Osteoporosis   . Arthritis   . TIA (transient ischemic attack) 10/11  . ETOH abuse     quit 03/2010  . 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 Personal history of PE (pulmonary embolism) 10/01/2010  . Blood transfusion   . S/P endoscopy September 28, 2010    erosive reflux esophagitis, Billroth I anatomy    Medications:  Scheduled:    . sodium chloride   Intravenous Once  . aspirin  324 mg Oral Once  . feeding supplement  237 mL Oral BID BM  . fentaNYL  25 mcg Transdermal Q72H  . folic acid  1 mg Oral Daily  . heparin  4,000 Units Intravenous Once  . heparin  14 Units/kg/hr Intravenous Once  . heparin  14 Units/kg/hr Intravenous Once  . HYDROmorphone      . HYDROmorphone  1 mg Intravenous Once  . megestrol  200 mg Oral BID  . nitroGLYCERIN  0.4 mg Sublingual Once  . olmesartan  5 mg Oral Daily  . ondansetron  4 mg Intravenous Once  . pantoprazole  40 mg Oral Q1200  . piperacillin-tazobactam  3.375 g  Intravenous Once  . piperacillin-tazobactam  3.375 g Intravenous Q8H  . sertraline  50 mg Oral QHS  . vancomycin  750 mg Intravenous Q12H  . vancomycin  1,000 mg Intravenous Once  . DISCONTD: vancomycin  750 mg Intravenous Q12H    Assessment:  History of bleeding noted. Heparin started in ER at 10 am. No Warfarin for now due to pending workup.  Goal of Therapy:  Heparin level 0.3-0.7 units/ml   Plan:  infusion rate 750 units/hr Obtain Heparin level at 4pm and adjust rate if needed. Heparin level and CBC daily.  Gilman Buttner, [place] J 01/17/2011,2:42 PM

## 2011-01-17 NOTE — ED Notes (Signed)
Per family, pt completed course of antibiotics last week for pneumonia.

## 2011-01-17 NOTE — ED Notes (Signed)
Chicken broth ordered from dietary per pt request. edp aware pt requesting pain med.

## 2011-01-17 NOTE — Progress Notes (Signed)
ANTIBIOTIC CONSULT NOTE -  Pharmacy Consult for Vancomycin and Zosyn Indication:  HCAP  No Known Allergies  Patient Measurements: Height: 5\' 8"  (172.7 cm) Weight: 120 lb (54.432 kg) IBW/kg (Calculated) : 68.4  Adjusted Body Weight: n/a  Vital Signs: Temp: 98 F (36.7 C) (10/28 0644) Temp src: Oral (10/28 0644) BP: 127/78 mmHg (10/28 1350) Pulse Rate: 79  (10/28 1350) Intake/Output from previous day:   Intake/Output from this shift:    Labs:  [facility] 01/17/11 0653  WBC 7.8  HGB 10.7*  PLT 122*  LABCREA --  CREATININE 0.82   Estimated Creatinine Clearance: 74.6 ml/min (by C-G formula based on Cr of 0.82).    Microbiology: Recent Results (from the past 720 hour(s))  CULTURE, BLOOD (ROUTINE X 2)     Status: Normal (Preliminary result)   Collection Time   01/17/11 10:16 AM      Component Value Range Status Comment   Specimen Description BLOOD LEFT ANTECUBITAL   Final    Special Requests     Final    Value: BOTTLES DRAWN AEROBIC AND ANAEROBIC 8CC EACH BOTTLE   Culture PENDING   Incomplete    Report Status PENDING   Incomplete   CULTURE, BLOOD (ROUTINE X 2)     Status: Normal (Preliminary result)   Collection Time   01/17/11 10:42 AM      Component Value Range Status Comment   Specimen Description BLOOD PORTA CATH DRAWN BY RN   Final    Special Requests     Final    Value: BOTTLES DRAWN AEROBIC AND ANAEROBIC 8CC EACH BOTTLE   Culture PENDING   Incomplete    Report Status PENDING   Incomplete     Medical History: Past Medical History  Diagnosis Date  . 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 Pulmonary embolism 02/2010  . Acid reflux   . Hypertension   . Osteoporosis   . Arthritis   . TIA (transient ischemic attack) 10/11  . ETOH abuse     quit 03/2010  . 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 Personal history of PE (pulmonary embolism) 10/01/2010  . Blood transfusion   . S/P endoscopy September 28, 2010    erosive reflux esophagitis,  Billroth I anatomy    Medications:  Noted and reviewed.  Assessment: Initial empiric therapy for HCAP- immunocompromised patient.  Goal of Therapy:  Vancomycin trough level 15-20 mcg/ml  Plan:  Vancomycin 1000 mg IV initially, followed by 750 mg IV every 12 hours. Zosyn 3.375 grams IV every 8 hours. Obtain trough at steady state.  Gilman Buttner, [place] J 01/17/2011,2:47 PM

## 2011-01-17 NOTE — H&P (Addendum)
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 According to patient he visited  chemotherapy about 10 days back and during that time was feeling fatigued and feverish.. Also had a temperature at home for which he had a CXR done that showed PNA and was given a course of po abx. For 7 days. He felt better for next 3-4 days but yesterday again started having SOB on exertion and was having cough with whitish phlegm for last 2 days. He felt feverish as well. This morning he woke up at 3 am with pressure like substernal chest pain radiating to the left side. He denies any aggravating or relieving factors. The pain was 5 /10 in severity and improved after receiving ASA  In the ED. He informs having 2/10 pain now. denies palpitations , sweating or headache. , denies orthopnea or PND. Denies nausea or vomiting. Denies new abdominal pain and denies any bowel or urinary symptoms. He denies chest pain in past. He informs his SOB and chest pain to be better now.  He denies any sick contacts. He informs being compliant to his medications.       Current medications ( started on admission)  Medication Dose Route Frequency Provider Last Rate Last Dose  . 0.9 %  sodium chloride infusion   Intravenous Once Shelda Jakes, MD 100 mL/hr at 01/17/11 [id number]    . aspirin chewable tablet 324 mg  324 mg Oral Once Shelda Jakes, MD   324 mg at 01/17/11 1191  . heparin 100 units/mL bolus via infusion 4,000  Units  4,000 Units Intravenous Once Shelda Jakes, MD   4,000 Units at 01/17/11 1015  . heparin ADULT infusion 100 units/mL (25000 units/250 mL)  14 Units/kg/hr Intravenous Once Shelda Jakes, MD 7.5 mL/hr at 01/17/11 1059 14 Units/kg/hr at 01/17/11 1059  . HYDROmorphone (DILAUDID) injection 1 mg  1 mg Intravenous Once Shelda Jakes, MD   1 mg at 01/17/11 0926  . iohexol (OMNIPAQUE) 350 MG/ML injection 100 mL  100 mL Intravenous Once PRN Shelda Jakes, MD   100 mL at 01/17/11 0852  . nitroGLYCERIN (NITROSTAT) SL tablet 0.4 mg  0.4 mg Sublingual Once Shelda Jakes, MD   0.4 mg at 01/17/11 0655  . ondansetron (ZOFRAN) injection 4 mg  4 mg Intravenous Once Shelda Jakes, MD   4 mg at 01/17/11 0926  . piperacillin-tazobactam (ZOSYN) IVPB 3.375 g  3.375 g Intravenous Once Shelda Jakes, MD      . vancomycin (VANCOCIN) IVPB 1000 mg/200 mL premix  1,000 mg Intravenous Once Shelda Jakes, MD      . DISCONTD: 0.9 %  sodium chloride infusion   Intravenous Continuous Shelda Jakes, MD       Medications Prior to Admission  Medication Sig Dispense Refill  . CYANOCOBALAMIN IJ Inject 1,000 mcg as directed every 30 (thirty) days.        [name]  dexlansoprazole (DEXILANT) 60 MG capsule Take 60 mg by mouth daily.        . feeding supplement (ENSURE CLINICAL STRENGTH) LIQD Take 237 mLs by mouth 2 (two) times daily between meals.      . folic acid (FOLVITE) 1 MG tablet Take 1 mg by mouth daily.        [name] lidocaine-prilocaine (EMLA) cream Apply 1 application topically as needed. For port access      . LORazepam (ATIVAN) 1 MG tablet Take 1 mg by mouth every 4 (four) hours as needed. Every 3-4 hours prn n/v      . megestrol (MEGACE) 40 MG/ML suspension Take 200 mg by mouth 2 (two) times daily.        [name] olmesartan (BENICAR) 5 MG tablet Take 5 mg by mouth daily.        [name] omeprazole (PRILOSEC) 20 MG capsule Take 40 mg by mouth daily.       . ondansetron (ZOFRAN) 4 MG tablet Take 4 mg  by mouth every 12 (twelve) hours as needed. For nausea      . prochlorperazine (COMPAZINE) 10 MG tablet Take 10 mg by mouth every 6 (six) hours as needed.        . prochlorperazine (COMPAZINE) 25 MG suppository Place 25 mg rectally every 12 (twelve) hours as needed. For nausea      . sertraline (ZOLOFT) 50 MG tablet Take 50 mg by mouth at bedtime.       . thiamine 100 MG tablet Take 100 mg by mouth daily.        [name] amoxicillin-clavulanate (AUGMENTIN) 875-125 MG per tablet Take 1 tablet by mouth 2 (two) times daily.  14 tablet  0  . cyclobenzaprine (FLEXERIL) 10 MG tablet Take 10 mg by mouth as needed. For muscle spasms      . Cyclobenzaprine HCl (FLEXERIL PO) Take 10 mg by mouth 3 (three) times daily as needed.        [name] HYDROcodone-acetaminophen (NORCO) 5-325 MG per tablet Take 1 tablet by mouth every 6 (six) hours as needed. For pain       Allergies: No Known Allergies  Past Medical History  Diagnosis Date  . 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    Stage 3 Patient was admitted in may for SBO and had laprotomy done . He is undergoing chemotherapy with Dr Worthy Flank. He has hx of pancytopenia and neutropenic fever recently for which he was on abx as well  . Pulmonary embolism Patient was on  6 months of coumadin 02/2010    . Acid reflux   . Hypertension   . Osteoporosis   . Arthritis   . TIA (transient ischemic attack) 10/11  . ETOH abuse     quit 03/2010  . 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        [name] S/P endoscopy September 28, 2010   For  erosive reflux esophagitis, hx of Billroth I anatomy    Past Surgical History  Procedure Date  . Hernia repair     right inguinal  . Appendectomy 1980s  . Cholecystectomy 1980s  . Colon surgery May 2012    left  . Portacath placement   . Abdominal sugery     for bowel obstruction x 8  . Esophagogastroduodenoscopy 09/28/2010    Procedure: ESOPHAGOGASTRODUODENOSCOPY (EGD);  Surgeon: Corbin Ade, MD;  Location: [facility];   Service: Endoscopy;  Laterality: N/A;  . Esophagogastroduodenoscopy 12/01/2010    Procedure:  ESOPHAGOGASTRODUODENOSCOPY (EGD);  Surgeon: Corbin Ade, MD;  Location: [facility];  Service: Endoscopy;  Laterality: N/A;  3:00    Family History  Problem Relation Age of Onset  . Hypertension Mother   . Hypertension Father   . Colon cancer Neg Hx    Social History:  reports that he has been smoking Cigarettes 1/2 ppd .  He has a 20 pack-year smoking history. He does not have any smokeless tobacco history on file. He reports that he does not drink alcohol or use illicit drugs. Worked in [employer] and has not been working since his surgery in may 2012.   REVIEW OF SYSTEMS:  Constitutional : pos for subjective :fever, chills, diaphoresis, denies appetite change and fatigue.  HEENT: Denies photophobia, eye pain, redness, hearing loss, ear pain, congestion, sore throat, rhinorrhea, sneezing, mouth sores, trouble swallowing, neck pain, neck stiffness and tinnitus.   Respiratory:  SOB +, DOE+, cough+, chest tightness+,   Cardiovascular:  chest pain+ ,no  palpitations and leg swelling.  Gastrointestinal: Denies nausea, vomiting, abdominal pain present and chronic , denies diarrhea, constipation, blood in stool and abdominal distention.  Genitourinary: Denies dysuria, urgency, frequency, hematuria, flank pain and difficulty urinating.  Musculoskeletal: has general fatigue,, denies back pain, joint swelling, arthralgias and gait problem.  Skin: Denies pallor, rash and wound.  Neurological: Denies dizziness, seizures, syncope, weakness, light-headedness, numbness and headaches.  Hematological: Denies adenopathy. Easy bruising, personal or family bleeding history  Psychiatric/Behavioral: Denies suicidal ideation, mood changes, confusion, nervousness, sleep disturbance and agitation  PHYSICAL EXAM:  Filed Vitals:   01/17/11 0658 01/17/11 0824 01/17/11 1048 01/17/11 1128  BP: 112/86 122/80 131/74  146/93  Pulse:  89 93 101  Temp:      TempSrc:      Resp:  17 20 16   Height:      Weight:      SpO2:  100% 100% 98%    Constitutional: Vital signs reviewed.  Patient is a a thin built male   in no acute distress  HEENT : PERRL, EOMI, conjunctivae normal, No scleral icterus, no oral thrush, moist oral mucosa.  No JVD present  Cardiovascular: reproducible pain over substernal area ,s1&s2 irregular,  no MRG,  Pulmonary/Chest: CTAB, fine crackles over left infraaxillary area, no ronchi or wheeze. Left sided Port A cath Abdominal: midline laprotomy scar.  Soft. Tenderness to palpation over LUQ ( which is chronic), non-distended, bowel sounds are normal, no masses, organomegaly, or guarding present.  GU: no CVA tenderness Musculoskeletal: No joint deformities, erythema, or stiffness, ROM full and no nontender Extremities: warm, no edema, distal pulses palpable Neurological: A&O x3, non focal   BMET    Component Value Date/Time   NA 134* 01/17/2011 0653   K 3.2* 01/17/2011 0653   CL 103 01/17/2011 0653   CO2 17* 01/17/2011 0653   GLUCOSE 111* 01/17/2011 0653   BUN 3* 01/17/2011 0653   CREATININE 0.82 01/17/2011 0653   CALCIUM 9.2 01/17/2011 0653   GFRNONAA >90 01/17/2011 0653   GFRAA >90 01/17/2011 0653     CBC    Component Value Date/Time   WBC 7.8 01/17/2011 0653   RBC 3.42* 01/17/2011 0653   HGB 10.7* 01/17/2011 0653   HCT 30.8* 01/17/2011 0653   PLT 122* 01/17/2011 0653   MCV 90.1 01/17/2011 0653   MCH 31.3 01/17/2011 0653   MCHC 34.7 01/17/2011 0653   RDW 21.8* 01/17/2011 0653   LYMPHSABS 1.4 01/04/2011 1005   MONOABS 0.7 01/04/2011  1005   EOSABS 0.1 01/04/2011 1005   BASOSABS 0.0 01/04/2011 1005     Results for orders placed during the hospital encounter of 01/17/11 (from the past 48 hour(s))  CBC     Status: Abnormal   Collection Time   01/17/11  6:53 AM      Component Value Range Comment   WBC 7.8  4.0 - 10.5 (K/uL)    RBC 3.42 (*) 4.22 - 5.81 (MIL/uL)      Hemoglobin 10.7 (*) 13.0 - 17.0 (g/dL)    HCT 08.6 (*) 57.8 - 52.0 (%)    MCV 90.1  78.0 - 100.0 (fL)    MCH 31.3  26.0 - 34.0 (pg)    MCHC 34.7  30.0 - 36.0 (g/dL)    RDW 46.9 (*) 62.9 - 15.5 (%)    Platelets 122 (*) 150 - 400 (K/uL)   BASIC METABOLIC PANEL     Status: Abnormal   Collection Time   01/17/11  6:53 AM      Component Value Range Comment   Sodium 134 (*) 135 - 145 (mEq/L)    Potassium 3.2 (*) 3.5 - 5.1 (mEq/L)    Chloride 103  96 - 112 (mEq/L)    CO2 17 (*) 19 - 32 (mEq/L)    Glucose, Bld 111 (*) 70 - 99 (mg/dL)    BUN 3 (*) 6 - 23 (mg/dL)    Creatinine, Ser 5.28  0.50 - 1.35 (mg/dL)    Calcium 9.2  8.4 - 10.5 (mg/dL)    GFR calc non Af Amer >90  >90 (mL/min)    GFR calc Af Amer >90  >90 (mL/min)   CARDIAC PANEL(CRET KIN+CKTOT+MB+TROPI)     Status: Normal   Collection Time   01/17/11  6:53 AM      Component Value Range Comment   Total CK 110  7 - 232 (U/L)    CK, MB 2.1  0.3 - 4.0 (ng/mL)    Troponin I <0.30  <0.30 (ng/mL)    Relative Index 1.9  0.0 - 2.5    D-DIMER, QUANTITATIVE     Status: Abnormal   Collection Time   01/17/11  6:53 AM      Component Value Range Comment   D-Dimer, Quant 14.48 (*) 0.00 - 0.48 (ug/mL-FEU)   LIPASE, BLOOD     Status: Normal   Collection Time   01/17/11  6:55 AM      Component Value Range Comment   Lipase 58  11 - 59 (U/L)   PROTIME-INR     Status: Normal   Collection Time   01/17/11  6:55 AM      Component Value Range Comment   Prothrombin Time 14.3  11.6 - 15.2 (seconds)    INR 1.09  0.00 - 1.49    HEPATIC FUNCTION PANEL     Status: Normal   Collection Time   01/17/11  6:55 AM      Component Value Range Comment   Total Protein 6.6  6.0 - 8.3 (g/dL)    Albumin 3.6  3.5 - 5.2 (g/dL)    AST 20  0 - 37 (U/L)    ALT 7  0 - 53 (U/L)    Alkaline Phosphatase 108  39 - 117 (U/L)    Total Bilirubin 0.3  0.3 - 1.2 (mg/dL)    Bilirubin, Direct <4.1  0.0 - 0.3 (mg/dL)    Indirect Bilirubin NOT CALCULATED  0.3 - 0.9 (mg/dL)    POCT I-STAT  TROPONIN I     Status: Normal   Collection Time   01/17/11  6:56 AM      Component Value Range Comment   Troponin i, poc 0.00  0.00 - 0.08 (ng/mL)    Comment 3            URINALYSIS, ROUTINE W REFLEX MICROSCOPIC     Status: Abnormal   Collection Time   01/17/11  9:46 AM      Component Value Range Comment   Color, Urine YELLOW  YELLOW     Appearance CLEAR  CLEAR     Specific Gravity, Urine <1.005 (*) 1.005 - 1.030     pH 5.5  5.0 - 8.0     Glucose, UA NEGATIVE  NEGATIVE (mg/dL)    Hgb urine dipstick NEGATIVE  NEGATIVE     Bilirubin Urine NEGATIVE  NEGATIVE     Ketones, ur NEGATIVE  NEGATIVE (mg/dL)    Protein, ur NEGATIVE  NEGATIVE (mg/dL)    Urobilinogen, UA 0.2  0.0 - 1.0 (mg/dL)    Nitrite NEGATIVE  NEGATIVE     Leukocytes, UA NEGATIVE  NEGATIVE  MICROSCOPIC NOT DONE ON URINES WITH NEGATIVE PROTEIN, BLOOD, LEUKOCYTES, NITRITE, OR GLUCOSE <1000 mg/dL.  TROPONIN I     Status: Normal   Collection Time   01/17/11 10:01 AM      Component Value Range Comment   Troponin I <0.30  <0.30 (ng/mL)   CULTURE, BLOOD (ROUTINE X 2)     Status: Normal (Preliminary result)   Collection Time   01/17/11 10:16 AM      Component Value Range Comment   Specimen Description BLOOD LEFT ANTECUBITAL      Special Requests        Value: BOTTLES DRAWN AEROBIC AND ANAEROBIC 8CC EACH BOTTLE   Culture PENDING      Report Status PENDING     POCT I-STAT TROPONIN I     Status: Normal   Collection Time   01/17/11 10:36 AM      Component Value Range Comment   Troponin i, poc 0.00  0.00 - 0.08 (ng/mL)    Comment 3            CULTURE, BLOOD (ROUTINE X 2)     Status: Normal (Preliminary result)   Collection Time   01/17/11 10:42 AM      Component Value Range Comment   Specimen Description BLOOD PORTA CATH DRAWN BY RN      Special Requests        Value: BOTTLES DRAWN AEROBIC AND ANAEROBIC 8CC EACH BOTTLE   Culture PENDING      Report Status PENDING      Ct Angio Chest W/cm &/or Wo  Cm  01/17/2011  *RADIOLOGY REPORT*  Clinical Data:  Shortness of breath, colon cancer, history of PE.  CT ANGIOGRAPHY CHEST WITH CONTRAST  Technique:  Multidetector CT imaging of the chest was performed using the standard protocol during bolus administration of intravenous contrast.  Multiplanar CT image reconstructions including MIPs were obtained to evaluate the vascular anatomy.  Contrast: OMNIPAQUE IOHEXOL 350 MG/ML IV SOLN  Comparison:  Chest radiograph dated 01/17/2011.  CT chest dated 11/15/2010.  Findings:  Right lower lobe pulmonary embolus (series 9/image 176) extending into smaller subsegmental branches.  Overall clot burden is small.  Mild patchy opacity in the subpleural left upper lobe (series 6/image 45), possibly infectious.  Stable subpleural reticulation with small pulmonary nodules measuring up to 4 mm in the  right upper lobe (series 6/image 55). Mild linear scarring at the left lung base.  No pleural effusion or pneumothorax.  Visualized thyroid is mildly 80.  The heart is normal in size.  No pericardial effusion.  Coronary atherosclerosis.  No suspicious mediastinal, hilar, or axillary lymphadenopathy.  Mild degenerative changes of the visualized thoracolumbar spine.  Review of the MIP images confirms the above findings.  IMPRESSION: Right lower lobe pulmonary embolus.  Overall clot burden is small.  Mild patchy opacity in the subpleural left upper lobe, possibly infectious.  Critical Value/emergent results were called by telephone at the time of interpretation on 01/17/2011  at 0930 hours  to  Dr Vanetta Mulders, who verbally acknowledged these results.  Original Report Authenticated By: Charline Bills, M.D.   Dg Chest Portable 1 View  01/17/2011  *RADIOLOGY REPORT*  Clinical Data: Pain  PORTABLE CHEST - 1 VIEW  Comparison: 01/06/2011  Findings: Power port type left central venous catheter with tip in the SVC.  Normal heart size and pulmonary vascularity.  No focal airspace  disease.  No blunting of costophrenic angles.  No pneumothorax.  Surgical clips in the upper abdomen.  Tortuous aorta.  IMPRESSION: No evidence of active pulmonary disease.  Original Report Authenticated By: Marlon Pel, M.D.   EKG  NSR @88  with frequent PVCs, LVH, no ST-T changes  Assessment/Plan Mr Nettleton is a 60 y/o male with hx fo adenocarcinoma of colon ( mucinous ) diagnosed in may 2012 followed by surgical resection and currently getting chemotherapy, hx of PE in 2011 ( post [facility] with coumadin for 6 mths) , hx of recent hemoptysis sec to erosive esophagitis , HTN, hx of TIA, active smoker, who presented to ED with acute onset substernal chest pain which woke him up from sleep. patient has been feeling feverish with cough and SOB since yesterday. evaluation in ED with CT angio chest to r/o PE suggested a rt middle lobe tiny embolus and a left sided infiltrate.  Patient being admitted to telemetry for HCAP, acute PE and rule out ACS.  Plan:  Chest pain Patient hemodynamically stable Initial troponin and EKG was negative for ischemia Given ASA 324 mg in ED Pain appears to be reproducible, however given risk factors will rule out for ACS.  Admit to telemetry, monitor serial CE and EKG ASA 81 mg po daily  s/l nitro 0.4 mg prn Morphine 2mg  prn   Patient on benicar  at home which i will continue.    Shortness of  breath with infiltrate on CT chest  Patient had recent hx of PNA treated as outpt with augmentin . Given hx of SOB and fevers associated with productive cough  And immunocompromised state will treat with IV vanco and zosyn for HCAP. Start on IV vanco 1gm q12 and zosyn 3.375 gm q8hrs  blood cx sent from ED and will follow  monitor WBC and temp curve  will also send sputum cx  Rt middle lobe PE  patient has hx of PE in 2011 and completed 6 mths of coumadin. He is coagulopathic with his underlying malignancy. Patient was started on IV heparin in ED and will continue for  now. however given his recent hx of hematemesis with erosive esophagitis , it definitely puts him at risk for bleeding . i will hold off on initiating coumadin at this time and discuss with his oncologist tomorrow.  i will also check doppler of his LE to look for DVT.   Erosive gastritis with hx of  hematemesis Denies any symptoms at present Cont with PPI Will closely monitor  Hx of adenocarcinoma Patient follwoed by Dr Worthy Flank and gets [facility] as outpt. He is due for his next chemo tomorrow i will contact his oncologist in the morning  Pancytopenia  stable at this time, monitor in am  Hypokalemia and low bicarb  will supplement po kcl and start gentle hydration with IVNS @75CC / hr  Diet : regular   DVT prophylaxis: on IV heparin drip  Code status: full code  primary contact : Justn Quale 01/17/2011, 12:06 PM

## 2011-01-17 NOTE — Progress Notes (Signed)
Notified Dr. Rennis Petty pt request to have po Dilaudid changed to IV because it upsets his abdomen.  New orders prescribed.

## 2011-01-17 NOTE — ED Notes (Signed)
Pt taken to floor. Nad. Belongings/glasses with pt.

## 2011-01-17 NOTE — ED Notes (Signed)
Reports chest pain began yesterday. States pain in mid chest and under left breast.  Reports some SOB also beginning yesterday.  Denies radiation of pain.  Denies nausea.

## 2011-01-17 NOTE — ED Notes (Signed)
Pt reports pain when first arrived at ED was 6:10.  Pt has received 324 mg ASA and 2 SL nitro.  Reports pan at present has decreased to a 2.  No distress noted.

## 2011-01-17 NOTE — ED Notes (Signed)
Soup and meds given. Nad. Pt states still unable to urinate.

## 2011-01-17 NOTE — ED Provider Notes (Signed)
Scribed for Shelda Jakes, MD, the patient was seen in room APA10/APA10. This chart was scribed by [employer]. The patient's care started at 07:09  CSN: [id number] Arrival date & time: 01/17/2011  6:31 AM   First MD Initiated Contact with Patient 01/17/11 [phone]      Chief Complaint  Patient presents with  . Chest Pain    HPI [name] is a 60 y.o. male who presents to the Emergency Department complaining of chest pain with associated shortness of breath. Patient states that shortness of breath started yesterday afternoon and chest pain started at about 3:30am. Chest pain is localized to the substernal left. Denies nausea or vomiting, swelling in the legs or a history of heart problems. Chest Pain is non radiating. Describes pain as a pressure and rates it at a 2/10 and a 5/10 on NPS at it's worst. Patient has received ASA in the ED and is feeling better. Patient also complains of productive cough and a recent history of pneumonia. Reports chronic abdominal pain secondary to an old abdominal surgery. Patient relates having night sweats in the last week. He relates a history of colon cancer and is currently getting chemotherapy. Last therapy was 2 weeks ago. Patient has a recurrent appointment with Dr Jerelyn Fowler Antos tomorrow  Past Medical History  Diagnosis Date  . 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 Pulmonary embolism 02/2010  . Acid reflux   . Hypertension   . Osteoporosis   . Arthritis   . TIA (transient ischemic attack) 10/11  . ETOH abuse     quit 03/2010  . 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 Personal history of PE (pulmonary embolism) 10/01/2010  . Blood transfusion   . S/P endoscopy September 28, 2010    erosive reflux esophagitis, Billroth I anatomy    Past Surgical History  Procedure Date  . Hernia repair     right inguinal  . Appendectomy 1980s  . Cholecystectomy 1980s  . Colon surgery May 2012    left  . Portacath placement   .  Abdominal sugery     for bowel obstruction x 8  . Esophagogastroduodenoscopy 09/28/2010    Procedure: ESOPHAGOGASTRODUODENOSCOPY (EGD);  Surgeon: Corbin Ade, MD;  Location: [facility];  Service: Endoscopy;  Laterality: N/A;  . Esophagogastroduodenoscopy 12/01/2010    Procedure: ESOPHAGOGASTRODUODENOSCOPY (EGD);  Surgeon: Corbin Ade, MD;  Location: [facility];  Service: Endoscopy;  Laterality: N/A;  3:00    Family History  Problem Relation Age of Onset  . Hypertension Mother   . Hypertension Father   . Colon cancer Neg Hx     History  Substance Use Topics  . Smoking status: Current Everyday Smoker -- 0.5 packs/day for 40 years    Types: Cigarettes  . Smokeless tobacco: Not on file  . Alcohol Use: No     hx heavy use x 12      Review of Systems  Constitutional: Positive for diaphoresis (night sweats). Negative for fatigue.  HENT: Negative for congestion, sinus pressure and ear discharge.   Eyes: Negative for discharge.  Respiratory: Positive for cough (productive).   Cardiovascular: Negative for chest pain.  Gastrointestinal: Positive for abdominal pain (chronic). Negative for nausea, vomiting and diarrhea.  Genitourinary: Negative for frequency and hematuria.  Musculoskeletal: Negative for back pain.  Skin: Negative for rash.  Neurological: Negative for seizures and headaches.  Hematological: Negative.   Psychiatric/Behavioral: Negative for hallucinations.  All other  systems reviewed and are negative.    Allergies  Review of patient's allergies indicates no known allergies.  Home Medications   Current Outpatient Rx  Name Route Sig Dispense Refill  . FENTANYL 25 MCG/HR TD PT72 Transdermal Place 1 patch onto the skin every 3 (three) days.      . AMOXICILLIN-POT CLAVULANATE 875-125 MG PO TABS Oral Take 1 tablet by mouth 2 (two) times daily. 14 tablet 0  . CYANOCOBALAMIN IJ Injection Inject 1,000 mcg as directed every 30 (thirty) days.      . CYCLOBENZAPRINE  HCL 10 MG PO TABS Oral Take 10 mg by mouth as needed. For muscle spasms    . FLEXERIL PO Oral Take 10 mg by mouth 3 (three) times daily as needed.      . DEXLANSOPRAZOLE 60 MG PO CPDR Oral Take 60 mg by mouth daily.      [name] ENSURE CLINICAL ST REVIGOR PO LIQD Oral Take 237 mLs by mouth 2 (two) times daily between meals.    [name] FOLIC ACID 1 MG PO TABS Oral Take 1 mg by mouth daily.      [name] HYDROCODONE-ACETAMINOPHEN 5-325 MG PO TABS Oral Take 1 tablet by mouth every 6 (six) hours as needed.      [name] LIDOCAINE-PRILOCAINE 2.5-2.5 % EX CREA Topical Apply 1 application topically as needed. For port access    . LORAZEPAM 1 MG PO TABS Oral Take 1 mg by mouth every 4 (four) hours as needed. Every 3-4 hours prn n/v    . MEGESTROL ACETATE 40 MG/ML PO SUSP Oral Take 200 mg by mouth 2 (two) times daily.      [name] OLMESARTAN MEDOXOMIL 5 MG PO TABS Oral Take 5 mg by mouth daily.      [name] OMEPRAZOLE 20 MG PO CPDR Oral Take 40 mg by mouth daily.     [name] ONDANSETRON HCL 4 MG PO TABS Oral Take 4 mg by mouth every 12 (twelve) hours as needed. For nausea    . PROCHLORPERAZINE MALEATE 10 MG PO TABS Oral Take 10 mg by mouth every 6 (six) hours as needed.      [name] PROCHLORPERAZINE 25 MG RE SUPP Rectal Place 25 mg rectally every 12 (twelve) hours as needed. For nausea    . SERTRALINE HCL 50 MG PO TABS Oral Take 50 mg by mouth at bedtime.     . THIAMINE HCL 100 MG PO TABS Oral Take 100 mg by mouth daily.        BP 112/86  Pulse 97  Temp(Src) 98 F (36.7 C) (Oral)  Resp 19  Ht 5\' 8"  (1.727 m)  Wt 120 lb (54.432 kg)  BMI 18.25 kg/m2  SpO2 100%  Physical Exam  Nursing note and vitals reviewed. Constitutional: He is oriented to person, place, and time. No distress.       Awake, alert, nontoxic appearance with baseline speech for patient.  HENT:  Head: Atraumatic.  Mouth/Throat: Oropharynx is clear and moist. No oropharyngeal exudate.  Eyes: Conjunctivae and EOM are normal. Right eye exhibits no discharge. Left eye exhibits no  discharge.  Neck: Neck supple.  Cardiovascular: Normal rate and regular rhythm.   No murmur heard. Pulmonary/Chest: Effort normal and breath sounds normal. No stridor. No respiratory distress. He has no wheezes. He has no rales. He exhibits no tenderness.       Patient with an access port in his left chest  Abdominal: Soft. Bowel sounds are normal. He exhibits no mass. There  is no tenderness. There is no rebound.  Musculoskeletal: Normal range of motion. He exhibits no edema and no tenderness.       Baseline ROM, moves extremities with no obvious new focal weakness.  Lymphadenopathy:    He has no cervical adenopathy.  Neurological: He is alert and oriented to person, place, and time. No cranial nerve deficit.       Awake, alert, cooperative and aware of situation; motor strength bilaterally no facial asymmetry; tongue midline; major cranial nerves appear intact;   Skin: No rash noted. He is not diaphoretic.  Psychiatric: He has a normal mood and affect.    ED Course  Procedures (including critical care time)  Labs Reviewed  CBC - Abnormal; Notable for the following:    RBC 3.42 (*)    Hemoglobin 10.7 (*)    HCT 30.8 (*)    RDW 21.8 (*)    Platelets 122 (*)    All other components within normal limits  BASIC METABOLIC PANEL - Abnormal; Notable for the following:    Sodium 134 (*)    Potassium 3.2 (*)    CO2 17 (*)    Glucose, Bld 111 (*)    BUN 3 (*)    All other components within normal limits  D-DIMER, QUANTITATIVE - Abnormal; Notable for the following:    D-Dimer, Quant 14.48 (*)    All other components within normal limits  CARDIAC PANEL(CRET KIN+CKTOT+MB+TROPI)  POCT I-STAT TROPONIN I  LIPASE, BLOOD  PROTIME-INR  HEPATIC FUNCTION PANEL  I-STAT TROPONIN I  URINALYSIS, ROUTINE W REFLEX MICROSCOPIC    Date: 01/17/2011  Rate: 88  Rhythm: normal sinus rhythm and premature ventricular contractions (PVC)  QRS Axis: normal  Intervals: normal  ST/T Wave abnormalities:  nonspecific ST changes  Conduction Disutrbances:none  Narrative Interpretation:   Old EKG Reviewed: unchanged LVH by voltage  Ct Angio Chest W/cm &/or Wo Cm  01/17/2011  *RADIOLOGY REPORT*  Clinical Data:  Shortness of breath, colon cancer, history of PE.  CT ANGIOGRAPHY CHEST WITH CONTRAST  Technique:  Multidetector CT imaging of the chest was performed using the standard protocol during bolus administration of intravenous contrast.  Multiplanar CT image reconstructions including MIPs were obtained to evaluate the vascular anatomy.  Contrast: OMNIPAQUE IOHEXOL 350 MG/ML IV SOLN  Comparison:  Chest radiograph dated 01/17/2011.  CT chest dated 11/15/2010.  Findings:  Right lower lobe pulmonary embolus (series 9/image 176) extending into smaller subsegmental branches.  Overall clot burden is small.  Mild patchy opacity in the subpleural left upper lobe (series 6/image 45), possibly infectious.  Stable subpleural reticulation with small pulmonary nodules measuring up to 4 mm in the right upper lobe (series 6/image 55). Mild linear scarring at the left lung base.  No pleural effusion or pneumothorax.  Visualized thyroid is mildly 80.  The heart is normal in size.  No pericardial effusion.  Coronary atherosclerosis.  No suspicious mediastinal, hilar, or axillary lymphadenopathy.  Mild degenerative changes of the visualized thoracolumbar spine.  Review of the MIP images confirms the above findings.  IMPRESSION: Right lower lobe pulmonary embolus.  Overall clot burden is small.  Mild patchy opacity in the subpleural left upper lobe, possibly infectious.  Critical Value/emergent results were called by telephone at the time of interpretation on 01/17/2011  at 0930 hours  to  Dr Vanetta Mulders, who verbally acknowledged these results.  Original Report Authenticated By: Charline Bills, M.D.   Dg Chest Portable 1 View  01/17/2011  *RADIOLOGY REPORT*  Clinical Data: Pain  PORTABLE CHEST - 1 VIEW  Comparison:  01/06/2011  Findings: Power port type left central venous catheter with tip in the SVC.  Normal heart size and pulmonary vascularity.  No focal airspace disease.  No blunting of costophrenic angles.  No pneumothorax.  Surgical clips in the upper abdomen.  Tortuous aorta.  IMPRESSION: No evidence of active pulmonary disease.  Original Report Authenticated By: Marlon Pel, M.D.     No diagnosis found.  DIAGNOSTIC STUDIES: Oxygen Saturation is 100% on room air, normal by my interpretation.    COORDINATION OF CARE: 07:12 - EDP collected patient history and examined him at bedside. IV Fluids, Cardiac workup, bloodwork, ED EKG and chest X-rays ordered. 09:48 - EDP recheck on patient. Imaging results discussed. Patient to be hospitalized. He has been made aware of plan and agrees   CRITICAL CARE Performed by: Shelda Jakes.   Total critical care time: 30  Critical care time was exclusive of separately billable procedures and treating other patients.  Critical care was necessary to treat or prevent imminent or life-threatening deterioration.  Critical care was time spent personally by me on the following activities: development of treatment plan with patient and/or surrogate as well as nursing, discussions with consultants, evaluation of patient's response to treatment, examination of patient, obtaining history from patient or surrogate, ordering and performing treatments and interventions, ordering and review of laboratory studies, ordering and review of radiographic studies, pulse oximetry and re-evaluation of patient's condition.   MDM:   Patient will be admitted by the triad hospitalist. Patient with left upper lobe pneumonic process that would be a healthcare related pneumonia. Patient without hypoxia in the emergency department however will get blood cultures x2 in case patient ends up in ICU. Will treat with Zosyn and vancomycin. In addition CT shows a right sided small  pulmonary embolism. We'll initiate heparin bolus and heparin drip. Since a discussion with triad hospitalist because patient is known to have had GI bleed problems in the past. He also had a PE back in December of 2011. He was treated with Coumadin until July when he had a significant GI bleed. Coumadin was then stopped. History as per family of a smaller GI bleed in September. This discussed with patient and family decision made to at least now initiate with heparin and final decision of whether to continue on Coumadin to be made at a later date.  IMPRESSION: Diagnoses that have been ruled out:  Diagnoses that are still under consideration:  Final diagnoses:   Colon cancer Healthcare related pneumonia Pulmonary embolism  MEDICATIONS GIVEN IN THE E.D.  Medications  fentaNYL (DURAGESIC - DOSED MCG/HR) 25 MCG/HR (not administered)  aspirin chewable tablet 324 mg (324 mg Oral Given 01/17/11 0648)  nitroGLYCERIN (NITROSTAT) SL tablet 0.4 mg (0.4 mg Sublingual Given 01/17/11 0655)  0.9 %  sodium chloride infusion (  Intravenous New Bag 01/17/11 0648)  iohexol (OMNIPAQUE) 350 MG/ML injection 100 mL (100 mL Intravenous Contrast Given 01/17/11 0852)  HYDROmorphone (DILAUDID) injection 1 mg (1 mg Intravenous Given 01/17/11 0926)  ondansetron (ZOFRAN) injection 4 mg (4 mg Intravenous Given 01/17/11 0926)    DISCHARGE MEDICATIONS: New Prescriptions   No medications on file    SCRIBE ATTESTATION:   I personally performed the services described in this documentation, which was scribed in my presence. The recorded information has been reviewed and considered.    Shelda Jakes, MD 01/17/11 1011

## 2011-01-18 ENCOUNTER — Inpatient Hospital Stay (HOSPITAL_COMMUNITY): Payer: Federal, State, Local not specified - PPO

## 2011-01-18 LAB — CBC
HCT: 27.3 % — ABNORMAL LOW (ref 39.0–52.0)
Hemoglobin: 9.2 g/dL — ABNORMAL LOW (ref 13.0–17.0)
MCHC: 33.7 g/dL (ref 30.0–36.0)
MCV: 92.2 fL (ref 78.0–100.0)

## 2011-01-18 LAB — DIFFERENTIAL
Eosinophils Relative: 0 % (ref 0–5)
Lymphocytes Relative: 44 % (ref 12–46)
Monocytes Absolute: 0.8 10*3/uL (ref 0.1–1.0)
Monocytes Relative: 11 % (ref 3–12)
Neutro Abs: 3.1 10*3/uL (ref 1.7–7.7)

## 2011-01-18 LAB — HEPARIN LEVEL (UNFRACTIONATED)
Heparin Unfractionated: 0.27 IU/mL — ABNORMAL LOW (ref 0.30–0.70)
Heparin Unfractionated: 0.12 IU/mL — ABNORMAL LOW (ref 0.30–0.70)
Heparin Unfractionated: 0.29 IU/mL — ABNORMAL LOW (ref 0.30–0.70)

## 2011-01-18 LAB — VANCOMYCIN, TROUGH: Vancomycin Tr: 11.9 ug/mL (ref 10.0–20.0)

## 2011-01-18 LAB — BASIC METABOLIC PANEL
BUN: 3 mg/dL — ABNORMAL LOW (ref 6–23)
CO2: 14 mEq/L — ABNORMAL LOW (ref 19–32)
Chloride: 111 mEq/L (ref 96–112)
GFR calc Af Amer: 90 mL/min — ABNORMAL LOW (ref >90)
Potassium: 4 mEq/L (ref 3.5–5.1)

## 2011-01-18 LAB — TROPONIN I: Troponin I: <0.3 ng/mL (ref <0.30)

## 2011-01-18 MED ORDER — HEPARIN (PORCINE) IN NACL 100-0.45 UNIT/ML-% IJ SOLN: 1200.0000 [IU]/h | INTRAMUSCULAR | Status: DC

## 2011-01-18 MED ORDER — HYDROMORPHONE HCL 1 MG/ML IJ SOLN
1.0000 mg | INTRAMUSCULAR | Status: DC | PRN
  Administered 2011-01-18 – 2011-01-22 (×21): 1 mg via INTRAVENOUS
  Filled 2011-01-18 (×21): qty 1

## 2011-01-18 MED ORDER — MEGESTROL ACETATE 400 MG/10ML PO SUSP
200.0000 mg | Freq: Two times a day (BID) | ORAL | Status: DC
  Administered 2011-01-18 – 2011-01-20 (×5): 200 mg via ORAL
  Filled 2011-01-18 (×5): qty 10

## 2011-01-18 MED ORDER — HEPARIN (PORCINE) IN NACL 100-0.45 UNIT/ML-% IJ SOLN
20.0000 [IU]/kg/h | INTRAMUSCULAR | Status: DC
Start: 2011-01-18 — End: 2011-01-18
  Administered 2011-01-18: 20 [IU]/kg/h via INTRAVENOUS
  Filled 2011-01-18: qty 250

## 2011-01-18 MED ORDER — SODIUM BICARBONATE 8.4 % IV SOLN
Freq: Once | INTRAVENOUS | Status: DC
  Filled 2011-01-18: qty 1000

## 2011-01-18 MED ORDER — SODIUM CHLORIDE 0.9 % IJ SOLN
INTRAMUSCULAR | Status: AC
  Administered 2011-01-18: 10 mL
  Filled 2011-01-18: qty 20

## 2011-01-18 MED ORDER — VANCOMYCIN HCL IN DEXTROSE 1-5 GM/200ML-% IV SOLN
1000.0000 mg | Freq: Two times a day (BID) | INTRAVENOUS | Status: DC
  Administered 2011-01-18 – 2011-01-21 (×6): 1000 mg via INTRAVENOUS
  Filled 2011-01-18 (×8): qty 200

## 2011-01-18 MED ORDER — HEPARIN BOLUS VIA INFUSION
2000.0000 [IU] | Freq: Once | INTRAVENOUS | Status: AC
  Administered 2011-01-18: 2000 [IU] via INTRAVENOUS
  Filled 2011-01-18: qty 2000

## 2011-01-18 MED ORDER — SODIUM CHLORIDE 0.9 % IJ SOLN
INTRAMUSCULAR | Status: AC
  Administered 2011-01-18: 10 mL
  Filled 2011-01-18: qty 10

## 2011-01-18 NOTE — Progress Notes (Signed)
INITIAL ADULT NUTRITION ASSESSMENT Date: 01/18/2011   Time: 3:47 PM Reason for Assessment: Unplanned wt loss  ASSESSMENT: Male 60 y.o.  Dx: Pneumonia   Past Medical History  Diagnosis Date  . 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 Pulmonary embolism 02/2010  . Acid reflux   . Hypertension   . Osteoporosis   . Arthritis   . TIA (transient ischemic attack) 10/11  . ETOH abuse     quit 03/2010  . 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 Personal history of PE (pulmonary embolism) 10/01/2010  . Blood transfusion   . S/P endoscopy September 28, 2010    erosive reflux esophagitis, Billroth I anatomy  . Angina   . Shortness of breath   . Sleep apnea   . Recurrent upper respiratory infection (URI)   . Anxiety   . Pneumonia   . Anemia    Scheduled Meds:   . dextrose 5 % 1,000 mL with sodium bicarbonate 1 mEq/mL 50 mEq infusion   Intravenous Once  . feeding supplement  237 mL Oral BID BM  . fentaNYL  25 mcg Transdermal Q72H  . folic acid  1 mg Oral Daily  . heparin  2,000 Units Intravenous Once  . HYDROmorphone  1 mg Intravenous Once  . megestrol  200 mg Oral BID  . olmesartan  5 mg Oral Daily  . pantoprazole  40 mg Oral Q1200  . piperacillin-tazobactam  3.375 g Intravenous Q8H  . sertraline  50 mg Oral QHS  . sodium chloride      . sodium chloride      . sodium chloride      . vancomycin  1,000 mg Intravenous Q12H  . DISCONTD: heparin  14 Units/kg/hr Intravenous Once  . DISCONTD: HYDROmorphone      . DISCONTD: megestrol  200 mg Oral BID  . DISCONTD: vancomycin  750 mg Intravenous Q12H   Continuous Infusions:   . sodium chloride    . heparin 16 Units/kg/hr (01/18/11 0645)   PRN Meds:.cyclobenzaprine, HYDROcodone-acetaminophen, HYDROmorphone (DILAUDID) injection, lidocaine-prilocaine, LORazepam, ondansetron, senna-docusate, DISCONTD:  HYDROmorphone (DILAUDID) injection, DISCONTD:  HYDROmorphone (DILAUDID) injection, DISCONTD: HYDROmorphone, DISCONTD:  HYDROmorphone  Ht: 5\' 8"  (172.7 cm)  Wt: 118 lb 11.2 oz (53.842 kg)  Ideal Wt: 63.9 kg 68.4 kg (IBW-150#) % Ideal Wt: 79%  Usual Wt: 140# (5 months ago) % Usual Wt: 85%  Body mass index is 18.05 kg/(m^2).  Food/Nutrition Related Hx: Pt eating lunch during visit today.Very pleasant gentlemen who reports severe wt loss 22#, 15% since May this year. He is being tx for colon cancer. Currently has PNA as well. His appetite comes and goes. Some days he is able to eat well and others he can only tol broth or chicken noodle soup. He likes Ensure and is able to drink 2-3 cans per day. He says his food doesn't taste right. He denies nausea or vomiting but has been having some diarrhea. He has severe malnutrition given his severe wt loss and his reported inadequate oral intake.    CMP     Component Value Date/Time   NA 136 01/18/2011 0534   K 4.0 01/18/2011 0534   CL 111 01/18/2011 0534   CO2 14* 01/18/2011 0534   GLUCOSE 157* 01/18/2011 0534   BUN 3* 01/18/2011 0534   CREATININE 1.03 01/18/2011 0534   CALCIUM 8.6 01/18/2011 0534   PROT 6.6 01/17/2011 0655   ALBUMIN 3.6 01/17/2011 0655   AST 20 01/17/2011  0655   ALT 7 01/17/2011 0655   ALKPHOS 108 01/17/2011 0655   BILITOT 0.3 01/17/2011 0655   GFRNONAA 78* 01/18/2011 0534   GFRAA 90* 01/18/2011 0534   CBC    Component Value Date/Time   WBC 7.0 01/18/2011 0534   RBC 2.96* 01/18/2011 0534   HGB 9.2* 01/18/2011 0534   HCT 27.3* 01/18/2011 0534   PLT 97* 01/18/2011 0534   MCV 92.2 01/18/2011 0534   MCH 31.1 01/18/2011 0534   MCHC 33.7 01/18/2011 0534   RDW 22.9* 01/18/2011 0534   LYMPHSABS 3.1 01/18/2011 0534   MONOABS 0.8 01/18/2011 0534   EOSABS 0.0 01/18/2011 0534   BASOSABS 0.0 01/18/2011 0534    Intake/Output Summary (Last 24 hours) at 01/18/11 1550 Last data filed at 01/18/11 0430  Gross per 24 hour  Intake 1072.75 ml  Output   1100 ml  Net -27.25 ml     Diet Order: General-Regular (po's  <50%)  Supplements/Tube Feeding: Ensure Clinical Strength 237 ml BID  IVF:    sodium chloride   heparin Last Rate: 16 Units/kg/hr (01/18/11 0645)    Estimated Nutritional Needs:   Kcal:1782-2052 Protein:81-97 grams Fluid:1.7--1.9 L/d  NUTRITION DIAGNOSIS: -Inadequate oral intake (NI-2.1).  Status: Ongoing  RELATED TO:  -Loss of appetite, decr taste perception   AS EVIDENCE BY:  -Unplanned wt loss of 22#,15% x 5 months, reported decreased oral intake  MONITORING/EVALUATION(Goals): -Pt  will tol Regular diet with consistent intake to meet est energy and protein needs -He will maintain current wt of 119# during hospitalization  EDUCATION NEEDS: -Education needs addressed r/t consuming adequate nutrition. Rec High Calorie, high protein snacks daily, eat smaller amounts of food more often, eat well when he does feel good. Eat first, then drink his beverages.  INTERVENTION: -Ensure Clinical Strength 237 ml BID (350 kcal, 13 gr protein) -Megace added for appetite per MD  Dietitian [phone]  DOCUMENTATION CODES Per approved criteria  -Severe malnutrition in the context of chronic illness    Francene Boyers 01/18/2011, 3:47 PM

## 2011-01-18 NOTE — Progress Notes (Signed)
ANTICOAGULATION CONSULT NOTE - Consult  Pharmacy Consult for  Intravenous Heparin drip Indication: chest pain/ACS                    Rule out VTE No Known Allergies  Patient Measurements: Height: 5\' 8"  (172.7 cm) Weight: 118 lb 11.2 oz (53.842 kg) IBW/kg (Calculated) : 68.4  Adjusted Body Weight:n/a  Vital Signs: Temp: 98.4 F (36.9 C) (10/29 0522) Temp src: Oral (10/29 0522) BP: 127/78 mmHg (10/29 0522) Pulse Rate: 101  (10/29 0522)  Labs:  [name] 01/18/11 0534 01/18/11 0007 01/17/11 1735 01/17/11 0655 01/17/11 0653  HGB 9.2* -- -- -- 10.7*  HCT 27.3* -- -- -- 30.8*  PLT 97* -- -- -- 122*  APTT -- -- -- -- --  LABPROT 16.1* -- -- 14.3 --  INR 1.26 -- -- 1.09 --  HEPARINUNFRC 0.27* -- 0.37 -- --  CREATININE 1.03 -- -- -- 0.82  CKTOTAL -- -- -- -- 110  CKMB -- -- -- -- 2.1  TROPONINI <0.30 <0.30 <0.30 -- --   Estimated Creatinine Clearance: 58.8 ml/min (by C-G formula based on Cr of 1.03).  Medical History: Past Medical History  Diagnosis Date  . 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 Pulmonary embolism 02/2010  . Acid reflux   . Hypertension   . Osteoporosis   . Arthritis   . TIA (transient ischemic attack) 10/11  . ETOH abuse     quit 03/2010  . 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 Personal history of PE (pulmonary embolism) 10/01/2010  . Blood transfusion   . S/P endoscopy September 28, 2010    erosive reflux esophagitis, Billroth I anatomy  . Angina   . Shortness of breath   . Sleep apnea   . Recurrent upper respiratory infection (URI)   . Anxiety   . Pneumonia   . Anemia     Medications:  Scheduled:     . dextrose 5 % 1,000 mL with sodium bicarbonate 1 mEq/mL 50 mEq infusion   Intravenous Once  . feeding supplement  237 mL Oral BID BM  . fentaNYL  25 mcg Transdermal Q72H  . folic acid  1 mg Oral Daily  . heparin  4,000 Units Intravenous Once  . heparin  14 Units/kg/hr Intravenous Once  . HYDROmorphone  1 mg Intravenous  Once  . megestrol  200 mg Oral BID  . olmesartan  5 mg Oral Daily  . ondansetron  4 mg Intravenous Once  . pantoprazole  40 mg Oral Q1200  . piperacillin-tazobactam  3.375 g Intravenous Once  . piperacillin-tazobactam  3.375 g Intravenous Q8H  . sertraline  50 mg Oral QHS  . sodium chloride      . sodium chloride      . vancomycin  750 mg Intravenous Q12H  . vancomycin  1,000 mg Intravenous Once  . DISCONTD: heparin  14 Units/kg/hr Intravenous Once  . DISCONTD: HYDROmorphone      . DISCONTD: megestrol  200 mg Oral BID  . DISCONTD: vancomycin  750 mg Intravenous Q12H    Assessment:  History of bleeding noted. Heparin level lower than goal. No Warfarin for now due to pending workup.  Goal of Therapy:  Heparin level 0.3-0.7 units/ml   Plan:  Increase Heparin to 850 units per hour. Patient has a shceduled Vancomycin trough at 1300 hrs. Will check Heparin level as well. Heparin level and CBC daily.  [name], [place] J 01/18/2011,8:49 AM

## 2011-01-18 NOTE — Progress Notes (Signed)
ANTICOAGULATION CONSULT NOTE - Consult  Pharmacy Consult for  Intravenous Heparin drip Indication:  PE                     No Known Allergies  Patient Measurements: Height: 5\' 8"  (172.7 cm) Weight: 118 lb 11.2 oz (53.842 kg) IBW/kg (Calculated) : 68.4  Adjusted Body Weight:n/a  Vital Signs: Temp: 98.4 F (36.9 C) (10/29 2151) BP: 139/84 mmHg (10/29 2151) Pulse Rate: 98  (10/29 2151)  Labs:  [name] 01/18/11 2133 01/18/11 1324 01/18/11 0534 01/18/11 0007 01/17/11 1735 01/17/11 0655 01/17/11 0653  HGB -- -- 9.2* -- -- -- 10.7*  HCT -- -- 27.3* -- -- -- 30.8*  PLT -- -- 97* -- -- -- 122*  APTT -- -- -- -- -- -- --  LABPROT -- -- 16.1* -- -- 14.3 --  INR -- -- 1.26 -- -- 1.09 --  HEPARINUNFRC 0.29* 0.12* 0.27* -- -- -- --  CREATININE -- -- 1.03 -- -- -- 0.82  CKTOTAL -- -- -- -- -- -- 110  CKMB -- -- -- -- -- -- 2.1  TROPONINI -- -- <0.30 <0.30 <0.30 -- --   Estimated Creatinine Clearance: 58.8 ml/min (by C-G formula based on Cr of 1.03).  Medical History: Past Medical History  Diagnosis Date  . 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 Pulmonary embolism 02/2010  . Acid reflux   . Hypertension   . Osteoporosis   . Arthritis   . TIA (transient ischemic attack) 10/11  . ETOH abuse     quit 03/2010  . 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 Personal history of PE (pulmonary embolism) 10/01/2010  . Blood transfusion   . S/P endoscopy September 28, 2010    erosive reflux esophagitis, Billroth I anatomy  . Angina   . Shortness of breath   . Sleep apnea   . Recurrent upper respiratory infection (URI)   . Anxiety   . Pneumonia   . Anemia     Medications:  Scheduled:     . dextrose 5 % 1,000 mL with sodium bicarbonate 1 mEq/mL 50 mEq infusion   Intravenous Once  . feeding supplement  237 mL Oral BID BM  . fentaNYL  25 mcg Transdermal Q72H  . folic acid  1 mg Oral Daily  . heparin  2,000 Units Intravenous Once  . megestrol  200 mg Oral BID  .  olmesartan  5 mg Oral Daily  . pantoprazole  40 mg Oral Q1200  . piperacillin-tazobactam  3.375 g Intravenous Q8H  . sertraline  50 mg Oral QHS  . sodium chloride      . sodium chloride      . sodium chloride      . vancomycin  1,000 mg Intravenous Q12H  . DISCONTD: heparin  14 Units/kg/hr Intravenous Once  . DISCONTD: megestrol  200 mg Oral BID  . DISCONTD: vancomycin  750 mg Intravenous Q12H    Assessment:  History of bleeding noted. Heparin level lower than goal. No Warfarin for now due to pending workup.  Goal of Therapy:  Heparin level 0.3-0.7 units/ml   Plan: Increase Heparin to 1200 units per hour. Heparin level 5 AM Heparin level and CBC daily.  Raquel James, [name] [place] 01/18/2011,10:32 PM

## 2011-01-18 NOTE — Progress Notes (Signed)
ANTIBIOTIC CONSULT NOTE -  Pharmacy Consult for Vancomycin and Zosyn Indication:  HCAP  No Known Allergies  Patient Measurements: Height: 5\' 8"  (172.7 cm) Weight: 118 lb 11.2 oz (53.842 kg) IBW/kg (Calculated) : 68.4  Adjusted Body Weight: n/a  Vital Signs: Temp: 98.4 F (36.9 C) (10/29 0522) Temp src: Oral (10/29 0522) BP: 127/78 mmHg (10/29 0522) Pulse Rate: 101  (10/29 0522) Intake/Output from previous day: 10/28 0701 - 10/29 0700 In: 1072.8 [P.O.:360; I.V.:712.8] Out: 1100 [Urine:1100] Intake/Output from this shift:    Labs:  Basename 01/18/11 0534 01/17/11 0653  WBC 7.0 7.8  HGB 9.2* 10.7*  PLT 97* 122*  LABCREA -- --  CREATININE 1.03 0.82   Estimated Creatinine Clearance: 58.8 ml/min (by C-G formula based on Cr of 1.03).  Results for HERSHELL, BRANDL (MRN [id number]) as of 01/18/2011 15:30  Ref. Range 01/18/2011 13:24  Vancomycin Tr Latest Range: 10.0-20.0 ug/mL 11.9    Microbiology: Recent Results (from the past 720 hour(s))  CULTURE, BLOOD (ROUTINE X 2)     Status: Normal (Preliminary result)   Collection Time   01/17/11 10:16 AM      Component Value Range Status Comment   Specimen Description BLOOD LEFT ANTECUBITAL   Final    Special Requests     Final    Value: BOTTLES DRAWN AEROBIC AND ANAEROBIC 8CC EACH BOTTLE   Culture NO GROWTH 1 DAY   Final    Report Status PENDING   Incomplete   CULTURE, BLOOD (ROUTINE X 2)     Status: Normal (Preliminary result)   Collection Time   01/17/11 10:42 AM      Component Value Range Status Comment   Specimen Description BLOOD PORTA CATH DRAWN BY RN   Final    Special Requests     Final    Value: BOTTLES DRAWN AEROBIC AND ANAEROBIC 8CC EACH BOTTLE   Culture NO GROWTH 1 DAY   Final    Report Status PENDING   Incomplete     Medical History: Past Medical History  Diagnosis Date  . 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 Pulmonary embolism 02/2010  . Acid reflux   . Hypertension     . Osteoporosis   . Arthritis   . TIA (transient ischemic attack) 10/11  . ETOH abuse     quit 03/2010  . 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 Personal history of PE (pulmonary embolism) 10/01/2010  . Blood transfusion   . S/P endoscopy September 28, 2010    erosive reflux esophagitis, Billroth I anatomy  . Angina   . Shortness of breath   . Sleep apnea   . Recurrent upper respiratory infection (URI)   . Anxiety   . Pneumonia   . Anemia     Medications:  Noted and reviewed.  Assessment: Initial empiric therapy for HCAP- immunocompromised patient.  Goal of Therapy:  Vancomycin trough level 15-20 mcg/ml  Plan:  Vancomycin 1000 mg IV every 12 hours. Zosyn 3.375 grams IV every 8 hours.    Gilman Buttner, [place] J 01/18/2011,3:29 PM

## 2011-01-18 NOTE — Progress Notes (Signed)
Subjective: Patient seen and examined this am. infomrs his CP and SOB to be better but feels tired overall and with some abdominal discomfort. Asked about hx of recetn diarrhea given low bicarb and says he has been having watery BMs 2-3 times a day for few weeks.   Objective:  Vital signs in last 24 hours:  Filed Vitals:   01/17/11 1350 01/17/11 1400 01/17/11 2016 01/18/11 0522  BP: 127/78 147/86 132/68 127/78  Pulse: 79 81 96 101  Temp:  99.5 F (37.5 C) 98.1 F (36.7 C) 98.4 F (36.9 C)  TempSrc:  Oral Axillary Oral  Resp: 16 20 20 20   Height:  5\' 8"  (1.727 m)    Weight:  53.842 kg (118 lb 11.2 oz)    SpO2: 100% 100% 100% 100%    Intake/Output from previous day:   Intake/Output Summary (Last 24 hours) at 01/18/11 1055 Last data filed at 01/18/11 0430  Gross per 24 hour  Intake 1072.75 ml  Output   1100 ml  Net -27.25 ml    Physical Exam: Gen: thin built middle aged male in NAD HEENT : no pallor,  no oral thrush, moist oral mucosa.  Cardiovascular: ,s1&s2 regular, no murmurs Pulmonary/Chest: CTAB, fine crackles over left infraaxillary area, no ronchi or wheeze. Left sided Port A cath  Abdominal: midline laprotomy scar. Soft. Tenderness to palpation over LUQ ( which is chronic), non-distended, bowel sounds are normal, no masses, organomegaly, or guarding present.  GU: no CVA tenderness Musculoskeletal: No joint deformities, erythema, or stiffness, ROM full and no nontender Extremities: warm, no edema, distal pulses palpable Neurological: A&O x3, non focal    Lab Results:  Basic Metabolic Panel:    Component Value Date/Time   NA 136 01/18/2011 0534   K 4.0 01/18/2011 0534   CL 111 01/18/2011 0534   CO2 14* 01/18/2011 0534   BUN 3* 01/18/2011 0534   CREATININE 1.03 01/18/2011 0534   GLUCOSE 157* 01/18/2011 0534   CALCIUM 8.6 01/18/2011 0534   CBC:    Component Value Date/Time   WBC 7.0 01/18/2011 0534   HGB 9.2* 01/18/2011 0534   HCT 27.3* 01/18/2011  0534   PLT 97* 01/18/2011 0534   MCV 92.2 01/18/2011 0534   NEUTROABS 3.1 01/18/2011 0534   LYMPHSABS 3.1 01/18/2011 0534   MONOABS 0.8 01/18/2011 0534   EOSABS 0.0 01/18/2011 0534   BASOSABS 0.0 01/18/2011 0534    Recent Results (from the past 240 hour(s))  CULTURE, BLOOD (ROUTINE X 2)     Status: Normal (Preliminary result)   Collection Time   01/17/11 10:16 AM      Component Value Range Status Comment   Specimen Description BLOOD LEFT ANTECUBITAL   Final    Special Requests     Final    Value: BOTTLES DRAWN AEROBIC AND ANAEROBIC 8CC EACH BOTTLE   Culture NO GROWTH 1 DAY   Final    Report Status PENDING   Incomplete   CULTURE, BLOOD (ROUTINE X 2)     Status: Normal (Preliminary result)   Collection Time   01/17/11 10:42 AM      Component Value Range Status Comment   Specimen Description BLOOD PORTA CATH DRAWN BY RN   Final    Special Requests     Final    Value: BOTTLES DRAWN AEROBIC AND ANAEROBIC 8CC EACH BOTTLE   Culture NO GROWTH 1 DAY   Final    Report Status PENDING   Incomplete     Studies/Results:  Ct Angio Chest W/cm &/or Wo Cm  01/17/2011  *RADIOLOGY REPORT*  Clinical Data:  Shortness of breath, colon cancer, history of PE.  CT ANGIOGRAPHY CHEST WITH CONTRAST  Technique:  Multidetector CT imaging of the chest was performed using the standard protocol during bolus administration of intravenous contrast.  Multiplanar CT image reconstructions including MIPs were obtained to evaluate the vascular anatomy.  Contrast: OMNIPAQUE IOHEXOL 350 MG/ML IV SOLN  Comparison:  Chest radiograph dated 01/17/2011.  CT chest dated 11/15/2010.  Findings:  Right lower lobe pulmonary embolus (series 9/image 176) extending into smaller subsegmental branches.  Overall clot burden is small.  Mild patchy opacity in the subpleural left upper lobe (series 6/image 45), possibly infectious.  Stable subpleural reticulation with small pulmonary nodules measuring up to 4 mm in the right upper lobe  (series 6/image 55). Mild linear scarring at the left lung base.  No pleural effusion or pneumothorax.  Visualized thyroid is mildly 80.  The heart is normal in size.  No pericardial effusion.  Coronary atherosclerosis.  No suspicious mediastinal, hilar, or axillary lymphadenopathy.  Mild degenerative changes of the visualized thoracolumbar spine.  Review of the MIP images confirms the above findings.  IMPRESSION: Right lower lobe pulmonary embolus.  Overall clot burden is small.  Mild patchy opacity in the subpleural left upper lobe, possibly infectious.  Critical Value/emergent results were called by telephone at the time of interpretation on 01/17/2011  at 0930 hours  to  Dr Vanetta Mulders, who verbally acknowledged these results.  Original Report Authenticated By: Charline Bills, M.D.   Dg Chest Portable 1 View  01/17/2011  *RADIOLOGY REPORT*  Clinical Data: Pain  PORTABLE CHEST - 1 VIEW  Comparison: 01/06/2011  Findings: Power port type left central venous catheter with tip in the SVC.  Normal heart size and pulmonary vascularity.  No focal airspace disease.  No blunting of costophrenic angles.  No pneumothorax.  Surgical clips in the upper abdomen.  Tortuous aorta.  IMPRESSION: No evidence of active pulmonary disease.  Original Report Authenticated By: Marlon Pel, M.D.    Medications: Scheduled Meds:   . dextrose 5 % 1,000 mL with sodium bicarbonate 1 mEq/mL 50 mEq infusion   Intravenous Once  . feeding supplement  237 mL Oral BID BM  . fentaNYL  25 mcg Transdermal Q72H  . folic acid  1 mg Oral Daily  . heparin  14 Units/kg/hr Intravenous Once  . HYDROmorphone  1 mg Intravenous Once  . megestrol  200 mg Oral BID  . olmesartan  5 mg Oral Daily  . pantoprazole  40 mg Oral Q1200  . piperacillin-tazobactam  3.375 g Intravenous Once  . piperacillin-tazobactam  3.375 g Intravenous Q8H  . sertraline  50 mg Oral QHS  . sodium chloride      . sodium chloride      . vancomycin  750 mg  Intravenous Q12H  . vancomycin  1,000 mg Intravenous Once  . DISCONTD: heparin  14 Units/kg/hr Intravenous Once  . DISCONTD: HYDROmorphone      . DISCONTD: megestrol  200 mg Oral BID  . DISCONTD: vancomycin  750 mg Intravenous Q12H   Continuous Infusions:   . sodium chloride    . heparin 16 Units/kg/hr (01/18/11 0645)   PRN Meds:.cyclobenzaprine, HYDROcodone-acetaminophen, HYDROmorphone (DILAUDID) injection, lidocaine-prilocaine, LORazepam, ondansetron, senna-docusate, DISCONTD:  HYDROmorphone (DILAUDID) injection, DISCONTD: HYDROmorphone, DISCONTD: HYDROmorphone  Assessment/Plan:  Mr Staffa is a 60 y/o male with hx fo adenocarcinoma of colon ( mucinous ) diagnosed  in may 2012 followed by surgical resection and currently getting chemotherapy, hx of PE in 2011 ( post [facility] with coumadin for 6 mths) , hx of recent hemoptysis sec to erosive esophagitis , HTN, hx of TIA, active smoker, who presented to ED with acute onset substernal chest pain which woke him up from sleep. patient has been feeling feverish with cough and SOB since 1 day. evaluation in ED with CT angio chest to r/o PE suggested a rt middle lobe tiny embolus and a left sided infiltrate.  Patientadmitted to telemetry for HCAP, acute PE and rule out ACS.   Plan:    HCAP Patient had recent hx of PNA treated as outpt with augmentin . Given hx of SOB and fevers associated with productive cough and immunocompromised state with left lung infiltrate on CT, being treated with IV vanco and zosyn for HCAP. ( DAY 2) blood cx sent from ED and will follow  monitor WBC and temp curve  Follow sputum cx  Rt middle lobe PE  patient has hx of PE in 2011 and completed 6 mths of coumadin. He is coagulopathic with his underlying malignancy. Patient was started on IV heparin in ED and will continue for now. however given his recent hx of hematemesis with erosive esophagitis , it definitely puts him at risk for bleeding . i will hold off on initiating  coumadin at this time and cont on IV heparin. Discussed with his oncologist Dr Worthy Flank who agrees on holding off on coumadin and to send him on sq lovenox instead.  i will also check doppler of his LE to look for DVT.   Erosive gastritis with hx of hematemesis  Denies any symptoms at present . likely secondary to active smoking and etoh., although he denies drinking for few months. Cont with PPI  Will closely monitor   Hx of adenocarcinoma  Patient follwoed by Dr Worthy Flank and gets [facility]o as outpt. He is due for his next chemo today Discussed with his oncologist over the phone today and agrees with current plan  Pancytopenia  Slow drop in platelets, will closely monitor, if platelets are stable will plan on d/c home onn lovenox  Hypokalemia and low bicarb  Has AG acidosis likely from GI loss. He gives hx of watery BMs 2-3 times daily for few weeks kcl supplemented and will switch fluids to D5W with 1 amp bicarb  monitor labs in am   Diet : regular  DVT prophylaxis: on IV heparin drip  Code status: full code      LOS: 1 day   Duell Holdren 01/18/2011, 10:55 AM

## 2011-01-18 NOTE — Progress Notes (Addendum)
ANTICOAGULATION CONSULT NOTE - Consult  Pharmacy Consult for  Intravenous Heparin drip Indication:  PE                     No Known Allergies  Patient Measurements: Height: 5\' 8"  (172.7 cm) Weight: 118 lb 11.2 oz (53.842 kg) IBW/kg (Calculated) : 68.4  Adjusted Body Weight:n/a  Vital Signs: Temp: 98.4 F (36.9 C) (10/29 0522) Temp src: Oral (10/29 0522) BP: 127/78 mmHg (10/29 0522) Pulse Rate: 101  (10/29 0522)  Labs:  Alvira Philips 01/18/11 1324 01/18/11 0534 01/18/11 0007 01/17/11 1735 01/17/11 0655 01/17/11 0653  HGB -- 9.2* -- -- -- 10.7*  HCT -- 27.3* -- -- -- 30.8*  PLT -- 97* -- -- -- 122*  APTT -- -- -- -- -- --  LABPROT -- 16.1* -- -- 14.3 --  INR -- 1.26 -- -- 1.09 --  HEPARINUNFRC 0.12* 0.27* -- 0.37 -- --  CREATININE -- 1.03 -- -- -- 0.82  CKTOTAL -- -- -- -- -- 110  CKMB -- -- -- -- -- 2.1  TROPONINI -- <0.30 <0.30 <0.30 -- --   Estimated Creatinine Clearance: 58.8 ml/min (by C-G formula based on Cr of 1.03).  Medical History: Past Medical History  Diagnosis Date  . 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 Pulmonary embolism 02/2010  . Acid reflux   . Hypertension   . Osteoporosis   . Arthritis   . TIA (transient ischemic attack) 10/11  . ETOH abuse     quit 03/2010  . 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 Personal history of PE (pulmonary embolism) 10/01/2010  . Blood transfusion   . S/P endoscopy September 28, 2010    erosive reflux esophagitis, Billroth I anatomy  . Angina   . Shortness of breath   . Sleep apnea   . Recurrent upper respiratory infection (URI)   . Anxiety   . Pneumonia   . Anemia     Medications:  Scheduled:     . dextrose 5 % 1,000 mL with sodium bicarbonate 1 mEq/mL 50 mEq infusion   Intravenous Once  . feeding supplement  237 mL Oral BID BM  . fentaNYL  25 mcg Transdermal Q72H  . folic acid  1 mg Oral Daily  . heparin  2,000 Units Intravenous Once  . HYDROmorphone  1 mg Intravenous Once  . megestrol   200 mg Oral BID  . olmesartan  5 mg Oral Daily  . pantoprazole  40 mg Oral Q1200  . piperacillin-tazobactam  3.375 g Intravenous Q8H  . sertraline  50 mg Oral QHS  . sodium chloride      . sodium chloride      . sodium chloride      . vancomycin  1,000 mg Intravenous Q12H  . DISCONTD: heparin  14 Units/kg/hr Intravenous Once  . DISCONTD: HYDROmorphone      . DISCONTD: megestrol  200 mg Oral BID  . DISCONTD: vancomycin  750 mg Intravenous Q12H    Assessment:  History of bleeding noted. Heparin level lower than goal. Spoke with Zannie Kehr RN who stated Heparin drip is running without problems. IV site is patent. No Warfarin for now due to pending workup.  Goal of Therapy:  Heparin level 0.3-0.7 units/ml   Plan:  Heparin bolus 2000 unitsI Increase Heparin to 1100 units per hour. Heparin level in 6 hours. Heparin level and CBC daily.  Gilman Buttner, [place] J 01/18/2011,3:26 PM

## 2011-01-19 ENCOUNTER — Other Ambulatory Visit (HOSPITAL_COMMUNITY): Payer: Self-pay | Admitting: Oncology

## 2011-01-19 ENCOUNTER — Telehealth: Payer: Self-pay | Admitting: Gastroenterology

## 2011-01-19 ENCOUNTER — Ambulatory Visit: Payer: Federal, State, Local not specified - PPO | Admitting: Gastroenterology

## 2011-01-19 LAB — CBC
HCT: 26 % — ABNORMAL LOW (ref 39.0–52.0)
Hemoglobin: 8.6 g/dL — ABNORMAL LOW (ref 13.0–17.0)
MCH: 30.8 pg (ref 26.0–34.0)
MCHC: 33.1 g/dL (ref 30.0–36.0)
RDW: 22.5 % — ABNORMAL HIGH (ref 11.5–15.5)

## 2011-01-19 LAB — BASIC METABOLIC PANEL
BUN: 6 mg/dL (ref 6–23)
Calcium: 8.7 mg/dL (ref 8.4–10.5)
GFR calc Af Amer: >90 mL/min (ref >90)
GFR calc non Af Amer: >90 mL/min (ref >90)
Glucose, Bld: 132 mg/dL — ABNORMAL HIGH (ref 70–99)

## 2011-01-19 MED ORDER — ENOXAPARIN (LOVENOX) PATIENT EDUCATION KIT
PACK | Freq: Once | Status: AC
  Administered 2011-01-19: 10:00:00
  Filled 2011-01-19: qty 1

## 2011-01-19 MED ORDER — SODIUM CHLORIDE 0.9 % IJ SOLN
INTRAMUSCULAR | Status: AC
  Administered 2011-01-19: 10 mL
  Filled 2011-01-19: qty 10

## 2011-01-19 MED ORDER — ENOXAPARIN SODIUM 60 MG/0.6ML ~~LOC~~ SOLN
1.0000 mg/kg | Freq: Two times a day (BID) | SUBCUTANEOUS | Status: DC
  Administered 2011-01-19 – 2011-01-22 (×7): 55 mg via SUBCUTANEOUS
  Filled 2011-01-19 (×7): qty 0.6

## 2011-01-19 NOTE — Consult Note (Signed)
[facility] Consultation Oncology  Name: [name]      MRN: [id number]    Location: A331/A331-01  Date: 01/19/2011 Time:10:09 AM   REFERRING PHYSICIAN:  Eddie North, MD  REASON FOR CONSULT:  Established patient with the [facility] with Stage IIIB Colon Cancer   DIAGNOSIS:  Stage IIIB Colon Cancer  HISTORY OF PRESENT ILLNESS:   This is a 60 yo Caucasian gentleman who is well known to the [facility] for a Stage IIIB Colon cancer.  He is S/P resection followed by 7 out of a planned 12 cycles of FOLFOX chemotherapy in the adjuvant setting.    The patient tends to have complications following each cycle of therapy that are not usually caused by chemotherapy, but therapy may play a small role.  The patient was administered cycle 7 of chemotherapy knowing that if he experiences a complication, that we will likely discontinue chemotherapy.  The patient reported to the ER with a cough productive of clear sputum and shortness of breath.  He also had substernal chest pain radiating to the left side of thorax.  As a result of these complaints and his history of colon cancer, a CT angio of chest was performed which illustrated a small, right lower lobe PE and left upper lobe opacity that may be infectious.  Therefore the patient was admitted for treatment of PE and antibiotic therapy for possibly pneumonia.  The patient reports that he feels "crummy" this morning.  He admits to feeling feverish at home and night sweats at home.  He denies any nausea or vomiting.  He denies any bowel complications or urinary complaints.  He denies any constipation or diarrhea.   He reports a strong appetite.  PAST MEDICAL HISTORY:   Past Medical History  Diagnosis Date  . 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 Pulmonary embolism 02/2010  . Acid reflux   . Hypertension   . Osteoporosis   . Arthritis   . TIA (transient ischemic attack) 10/11   . ETOH abuse     quit 03/2010  . 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 Personal history of PE (pulmonary embolism) 10/01/2010  . Blood transfusion   . S/P endoscopy September 28, 2010    erosive reflux esophagitis, Billroth I anatomy  . Angina   . Shortness of breath   . Sleep apnea   . Recurrent upper respiratory infection (URI)   . Anxiety   . Pneumonia   . Anemia     ALLERGIES: No Known Allergies    MEDICATIONS: I have reviewed the patient's current medications.     PAST SURGICAL HISTORY Past Surgical History  Procedure Date  . Hernia repair     right inguinal  . Appendectomy 1980s  . Cholecystectomy 1980s  . Colon surgery May 2012    left  . Portacath placement   . Abdominal sugery     for bowel obstruction x 8  . Esophagogastroduodenoscopy 09/28/2010    Procedure: ESOPHAGOGASTRODUODENOSCOPY (EGD);  Surgeon: Corbin Ade, MD;  Location: [facility];  Service: Endoscopy;  Laterality: N/A;  . Esophagogastroduodenoscopy 12/01/2010    Procedure: ESOPHAGOGASTRODUODENOSCOPY (EGD);  Surgeon: Corbin Ade, MD;  Location: [facility] ENDO SUITE;  Service: Endoscopy;  Laterality: N/A;  3:00    FAMILY HISTORY: Family History  Problem Relation Age of Onset  . Hypertension Mother   . Arthritis Mother   . Hypertension Father   .  Colon cancer Neg Hx     SOCIAL HISTORY:  reports that he has been smoking Cigarettes.  He has a 20 pack-year smoking history. He does not have any smokeless tobacco history on file. He reports that he does not drink alcohol or use illicit drugs.  PERFORMANCE STATUS: The patient's performance status is 2 - Symptomatic, <50% confined to bed  PHYSICAL EXAM: Most Recent Vital Signs: Blood pressure 133/81, pulse 100, temperature 97.7 F (36.5 C), temperature source Oral, resp. rate 18, height 5\' 8"  (1.727 m), weight 53.842 kg (118 lb 11.2 oz), SpO2 100.00%. General appearance: alert, cooperative, appears stated age, cachectic and no distress Head: Normocephalic,  without obvious abnormality, atraumatic Eyes: conjunctivae/corneas clear. PERRL, EOM's intact. Fundi benign. Neck: supple, symmetrical, trachea midline Lungs: rhonchi LLL Heart: regular rate and rhythm, S1, S2 normal, no murmur, click, rub or gallop Abdomen: soft, non-tender; bowel sounds normal; no masses,  no organomegaly Extremities: extremities normal, atraumatic, no cyanosis or edema Skin: Skin color, texture, turgor normal. No rashes or lesions Neurologic: Grossly normal  LABORATORY DATA:  Results for orders placed during the hospital encounter of 01/17/11 (from the past 48 hour(s))  CULTURE, BLOOD (ROUTINE X 2)     Status: Normal (Preliminary result)   Collection Time   01/17/11 10:16 AM      Component Value Range Comment   Specimen Description BLOOD LEFT ANTECUBITAL      Special Requests        Value: BOTTLES DRAWN AEROBIC AND ANAEROBIC 8CC EACH BOTTLE   Culture NO GROWTH 1 DAY      Report Status PENDING     POCT I-STAT TROPONIN I     Status: Normal   Collection Time   01/17/11 10:36 AM      Component Value Range Comment   Troponin i, poc 0.00  0.00 - 0.08 (ng/mL)    Comment 3            CULTURE, BLOOD (ROUTINE X 2)     Status: Normal (Preliminary result)   Collection Time   01/17/11 10:42 AM      Component Value Range Comment   Specimen Description BLOOD PORTA CATH DRAWN BY RN      Special Requests        Value: BOTTLES DRAWN AEROBIC AND ANAEROBIC 8CC EACH BOTTLE   Culture NO GROWTH 1 DAY      Report Status PENDING     TROPONIN I     Status: Normal   Collection Time   01/17/11  5:35 PM      Component Value Range Comment   Troponin I <0.30  <0.30 (ng/mL)   HEPARIN LEVEL     Status: Normal   Collection Time   01/17/11  5:35 PM      Component Value Range Comment   Heparin Unfractionated 0.37  0.30 - 0.70 (IU/mL)   TROPONIN I     Status: Normal   Collection Time   01/18/11 12:07 AM      Component Value Range Comment   Troponin I <0.30  <0.30 (ng/mL)   BASIC  METABOLIC PANEL     Status: Abnormal   Collection Time   01/18/11  5:34 AM      Component Value Range Comment   Sodium 136  135 - 145 (mEq/L)    Potassium 4.0  3.5 - 5.1 (mEq/L) DELTA CHECK NOTED   Chloride 111  96 - 112 (mEq/L)    CO2 14 (*) 19 - 32 (mEq/L)  Glucose, Bld 157 (*) 70 - 99 (mg/dL)    BUN 3 (*) 6 - 23 (mg/dL)    Creatinine, Ser 1.61  0.50 - 1.35 (mg/dL)    Calcium 8.6  8.4 - 10.5 (mg/dL)    GFR calc non Af Amer 78 (*) >90 (mL/min)    GFR calc Af Amer 90 (*) >90 (mL/min)   CBC     Status: Abnormal   Collection Time   01/18/11  5:34 AM      Component Value Range Comment   WBC 7.0  4.0 - 10.5 (K/uL)    RBC 2.96 (*) 4.22 - 5.81 (MIL/uL)    Hemoglobin 9.2 (*) 13.0 - 17.0 (g/dL)    HCT 09.6 (*) 04.5 - 52.0 (%)    MCV 92.2  78.0 - 100.0 (fL)    MCH 31.1  26.0 - 34.0 (pg)    MCHC 33.7  30.0 - 36.0 (g/dL)    RDW 40.9 (*) 81.1 - 15.5 (%)    Platelets 97 (*) 150 - 400 (K/uL)   DIFFERENTIAL     Status: Normal   Collection Time   01/18/11  5:34 AM      Component Value Range Comment   Neutrophils Relative 44  43 - 77 (%)    Neutro Abs 3.1  1.7 - 7.7 (K/uL)    Lymphocytes Relative 44  12 - 46 (%)    Lymphs Abs 3.1  0.7 - 4.0 (K/uL)    Monocytes Relative 11  3 - 12 (%)    Monocytes Absolute 0.8  0.1 - 1.0 (K/uL)    Eosinophils Relative 0  0 - 5 (%)    Eosinophils Absolute 0.0  0.0 - 0.7 (K/uL)    Basophils Relative 0  0 - 1 (%)    Basophils Absolute 0.0  0.0 - 0.1 (K/uL)   PROTIME-INR     Status: Abnormal   Collection Time   01/18/11  5:34 AM      Component Value Range Comment   Prothrombin Time 16.1 (*) 11.6 - 15.2 (seconds)    INR 1.26  0.00 - 1.49    HEPARIN LEVEL     Status: Abnormal   Collection Time   01/18/11  5:34 AM      Component Value Range Comment   Heparin Unfractionated 0.27 (*) 0.30 - 0.70 (IU/mL)   TROPONIN I     Status: Normal   Collection Time   01/18/11  5:34 AM      Component Value Range Comment   Troponin I <0.30  <0.30 (ng/mL)     VANCOMYCIN, TROUGH     Status: Normal   Collection Time   01/18/11  1:24 PM      Component Value Range Comment   Vancomycin Tr 11.9  10.0 - 20.0 (ug/mL)   HEPARIN LEVEL     Status: Abnormal   Collection Time   01/18/11  1:24 PM      Component Value Range Comment   Heparin Unfractionated 0.12 (*) 0.30 - 0.70 (IU/mL)   HEPARIN LEVEL     Status: Abnormal   Collection Time   01/18/11  9:33 PM      Component Value Range Comment   Heparin Unfractionated 0.29 (*) 0.30 - 0.70 (IU/mL)   HEPARIN LEVEL     Status: Normal   Collection Time   01/19/11  6:06 AM      Component Value Range Comment   Heparin Unfractionated 0.63  0.30 - 0.70 (IU/mL)  CBC     Status: Abnormal   Collection Time   01/19/11  6:06 AM      Component Value Range Comment   WBC 7.3  4.0 - 10.5 (K/uL)    RBC 2.79 (*) 4.22 - 5.81 (MIL/uL)    Hemoglobin 8.6 (*) 13.0 - 17.0 (g/dL)    HCT 19.1 (*) 47.8 - 52.0 (%)    MCV 93.2  78.0 - 100.0 (fL)    MCH 30.8  26.0 - 34.0 (pg)    MCHC 33.1  30.0 - 36.0 (g/dL)    RDW 29.5 (*) 62.1 - 15.5 (%)    Platelets 99 (*) 150 - 400 (K/uL)   BASIC METABOLIC PANEL     Status: Abnormal   Collection Time   01/19/11  6:06 AM      Component Value Range Comment   Sodium 134 (*) 135 - 145 (mEq/L)    Potassium 4.6  3.5 - 5.1 (mEq/L)    Chloride 107  96 - 112 (mEq/L)    CO2 18 (*) 19 - 32 (mEq/L)    Glucose, Bld 132 (*) 70 - 99 (mg/dL)    BUN 6  6 - 23 (mg/dL)    Creatinine, Ser 3.08  0.50 - 1.35 (mg/dL)    Calcium 8.7  8.4 - 10.5 (mg/dL)    GFR calc non Af Amer >90  >90 (mL/min)    GFR calc Af Amer >90  >90 (mL/min)       RADIOGRAPHY:  01/17/11  *RADIOLOGY REPORT*  Clinical Data: Shortness of breath, colon cancer, history of PE.  CT ANGIOGRAPHY CHEST WITH CONTRAST  Technique: Multidetector CT imaging of the chest was performed  using the standard protocol during bolus administration of  intravenous contrast. Multiplanar CT image reconstructions  including MIPs were obtained to  evaluate the vascular anatomy.  Contrast: OMNIPAQUE IOHEXOL 350 MG/ML IV SOLN  Comparison: Chest radiograph dated 01/17/2011. CT chest dated  11/15/2010.  Findings: Right lower lobe pulmonary embolus (series 9/image 176)  extending into smaller subsegmental branches. Overall clot burden  is small.  Mild patchy opacity in the subpleural left upper lobe (series  6/image 45), possibly infectious.  Stable subpleural reticulation with small pulmonary nodules  measuring up to 4 mm in the right upper lobe (series 6/image 55).  Mild linear scarring at the left lung base. No pleural effusion or  pneumothorax.  Visualized thyroid is mildly 80.  The heart is normal in size. No pericardial effusion. Coronary  atherosclerosis.  No suspicious mediastinal, hilar, or axillary lymphadenopathy.  Mild degenerative changes of the visualized thoracolumbar spine.  Review of the MIP images confirms the above findings.  IMPRESSION:  Right lower lobe pulmonary embolus. Overall clot burden is small.  Mild patchy opacity in the subpleural left upper lobe, possibly  infectious.  Critical Value/emergent results were called by telephone at the  time of interpretation on 01/17/2011 at 0930 hours to Dr Vanetta Mulders, who verbally acknowledged these results.  Original Report Authenticated By: Charline Bills, M.D.   01/17/11  *RADIOLOGY REPORT*  Clinical Data: Pain  PORTABLE CHEST - 1 VIEW  Comparison: 01/06/2011  Findings: Power port type left central venous catheter with tip in  the SVC. Normal heart size and pulmonary vascularity. No focal  airspace disease. No blunting of costophrenic angles. No  pneumothorax. Surgical clips in the upper abdomen. Tortuous  aorta.  IMPRESSION:  No evidence of active pulmonary disease.  Original Report Authenticated By: Marlon Pel, M.D  ASSESSMENT/PLAN:  1. Stage IIIB colon cancer, status post resection and now status post 5 cycles of FOLFOX  chemotherapy.  Will discontinue chemotherapy secondary to complications following each cycle.  2. Small PE, secondary to chemotherapy and associated hypercoaguable state. Recommend anticoagulation with LMWH (Lovenox) at 1 mg/kg daily. 3. Pneumonia, on IV antibiotics 4. Hyponatremia 5. Anemia, Hgb 8.6, will follow 6. Thrombocytopenia, plt count 99, will follow History of alcohol abuse status post detox in January 2012. Abstinent since then.  7. Abdominal pain secondary to multiple abdominal surgeries creating adhesions and scarring.  8. Weight loss  9. History of pulmonary embolism diagnosed in December of 2011.  10. Partial gastrectomy  11. Iron deficiency anemia, status post Feraheme infusion as an inpatient in September of 2012.  12. Questionable failure to thrive, improved.  All questions were answered. The patient knows to call the clinic with any problems, questions or concerns. We can certainly see the patient much sooner if necessary.  The patient and plan discussed with Glenford Peers, MD and he is in agreement with the aforementioned.  KEFALAS,THOMAS

## 2011-01-19 NOTE — Telephone Encounter (Signed)
Pt was a no show

## 2011-01-19 NOTE — Progress Notes (Addendum)
ANTICOAGULATION CONSULT NOTE - Consult  Pharmacy Consult for  Intravenous Heparin drip Indication:  PE                     No Known Allergies  Patient Measurements: Height: 5\' 8"  (172.7 cm) Weight: 118 lb 11.2 oz (53.842 kg) IBW/kg (Calculated) : 68.4  Adjusted Body Weight:n/a  Vital Signs: Temp: 97.7 F (36.5 C) (10/30 0537) Temp src: Oral (10/30 0537) BP: 133/81 mmHg (10/30 0537) Pulse Rate: 100  (10/30 0758)  Labs:  Basename 01/19/11 0606 01/18/11 2133 01/18/11 1324 01/18/11 0534 01/18/11 0007 01/17/11 1735 01/17/11 0655 01/17/11 0653  HGB 8.6* -- -- 9.2* -- -- -- --  HCT 26.0* -- -- 27.3* -- -- -- 30.8*  PLT 99* -- -- 97* -- -- -- 122*  APTT -- -- -- -- -- -- -- --  LABPROT -- -- -- 16.1* -- -- 14.3 --  INR -- -- -- 1.26 -- -- 1.09 --  HEPARINUNFRC 0.63 0.29* 0.12* -- -- -- -- --  CREATININE 0.87 -- -- 1.03 -- -- -- 0.82  CKTOTAL -- -- -- -- -- -- -- 110  CKMB -- -- -- -- -- -- -- 2.1  TROPONINI -- -- -- <0.30 <0.30 <0.30 -- --   Estimated Creatinine Clearance: 69.6 ml/min (by C-G formula based on Cr of 0.87).  Medical History: Past Medical History  Diagnosis Date  . 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 Pulmonary embolism 02/2010  . Acid reflux   . Hypertension   . Osteoporosis   . Arthritis   . TIA (transient ischemic attack) 10/11  . ETOH abuse     quit 03/2010  . 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 Personal history of PE (pulmonary embolism) 10/01/2010  . Blood transfusion   . S/P endoscopy September 28, 2010    erosive reflux esophagitis, Billroth I anatomy  . Angina   . Shortness of breath   . Sleep apnea   . Recurrent upper respiratory infection (URI)   . Anxiety   . Pneumonia   . Anemia     Medications:  Scheduled:     . dextrose 5 % 1,000 mL with sodium bicarbonate 1 mEq/mL 50 mEq infusion   Intravenous Once  . feeding supplement  237 mL Oral BID BM  . fentaNYL  25 mcg Transdermal Q72H  . folic acid  1 mg Oral  Daily  . heparin  2,000 Units Intravenous Once  . megestrol  200 mg Oral BID  . olmesartan  5 mg Oral Daily  . pantoprazole  40 mg Oral Q1200  . piperacillin-tazobactam  3.375 g Intravenous Q8H  . sertraline  50 mg Oral QHS  . sodium chloride      . vancomycin  1,000 mg Intravenous Q12H  . DISCONTD: megestrol  200 mg Oral BID  . DISCONTD: vancomycin  750 mg Intravenous Q12H    Assessment:  History of bleeding noted. Heparin level lower @ goal. No Warfarin for now due to pending workup.  Goal of Therapy:  Heparin level 0.3-0.7 units/ml   Plan: Continue Heparin to 1200 units per hour. Repeat Heparin Level this afternoon due to recent bleeding. Heparin level and CBC daily.  Lamonte Richer R 01/19/2011,8:25 AM   Transition to treatment dose Enoxaparin. Daily CBC. Teaching kit.

## 2011-01-19 NOTE — Progress Notes (Signed)
Subjective: Still feels weak and some shortness of breath on exertion. denies chest pain.  Objective:  Vital signs in last 24 hours:  Filed Vitals:   01/18/11 1500 01/18/11 2151 01/19/11 0537 01/19/11 0758  BP: 122/64 139/84 133/81   Pulse: 100 98 104 100  Temp: 98.1 F (36.7 C) 98.4 F (36.9 C) 97.7 F (36.5 C)   TempSrc:   Oral   Resp: 20 20 18    Height:      Weight:      SpO2: 100% 100% 98% 100%    Intake/Output from previous day:   Intake/Output Summary (Last 24 hours) at 01/19/11 1140 Last data filed at 01/19/11 0100  Gross per 24 hour  Intake    200 ml  Output    400 ml  Net   -200 ml    Physical Exam:  General: , in no acute distress. HEENT: no pallor, no icterus, moist oral mucosa, no JVD, no lymphadenopathy Heart: Normal  s1 &s2  Regular rate and rhythm, without murmurs, rubs, gallops. Lungs: Clear to auscultation bilaterally. Abdomen: Soft, nontender, nondistended, positive bowel sounds. Extremities: No clubbing cyanosis or edema with positive pedal pulses. Neuro: Alert, awake, oriented x3, nonfocal.   Lab Results:  Basic Metabolic Panel:    Component Value Date/Time   NA 134* 01/19/2011 0606   K 4.6 01/19/2011 0606   CL 107 01/19/2011 0606   CO2 18* 01/19/2011 0606   BUN 6 01/19/2011 0606   CREATININE 0.87 01/19/2011 0606   GLUCOSE 132* 01/19/2011 0606   CALCIUM 8.7 01/19/2011 0606   CBC:    Component Value Date/Time   WBC 7.3 01/19/2011 0606   HGB 8.6* 01/19/2011 0606   HCT 26.0* 01/19/2011 0606   PLT 99* 01/19/2011 0606   MCV 93.2 01/19/2011 0606   NEUTROABS 3.1 01/18/2011 0534   LYMPHSABS 3.1 01/18/2011 0534   MONOABS 0.8 01/18/2011 0534   EOSABS 0.0 01/18/2011 0534   BASOSABS 0.0 01/18/2011 0534    Recent Results (from the past 240 hour(s))  CULTURE, BLOOD (ROUTINE X 2)     Status: Normal (Preliminary result)   Collection Time   01/17/11 10:16 AM      Component Value Range Status Comment   Specimen Description BLOOD LEFT  ANTECUBITAL   Final    Special Requests     Final    Value: BOTTLES DRAWN AEROBIC AND ANAEROBIC 8CC EACH BOTTLE   Culture NO GROWTH 2 DAYS   Final    Report Status PENDING   Incomplete   CULTURE, BLOOD (ROUTINE X 2)     Status: Normal (Preliminary result)   Collection Time   01/17/11 10:42 AM      Component Value Range Status Comment   Specimen Description BLOOD PORTA CATH DRAWN BY RN   Final    Special Requests     Final    Value: BOTTLES DRAWN AEROBIC AND ANAEROBIC 8CC EACH BOTTLE   Culture NO GROWTH 2 DAYS   Final    Report Status PENDING   Incomplete     Studies/Results: No results found.  Medications: Scheduled Meds:   . dextrose 5 % 1,000 mL with sodium bicarbonate 1 mEq/mL 50 mEq infusion   Intravenous Once  . enoxaparin (LOVENOX) injection  1 mg/kg Subcutaneous Q12H  . enoxaparin   Does not apply Once  . feeding supplement  237 mL Oral BID BM  . fentaNYL  25 mcg Transdermal Q72H  . folic acid  1 mg Oral Daily  .  heparin  2,000 Units Intravenous Once  . megestrol  200 mg Oral BID  . olmesartan  5 mg Oral Daily  . pantoprazole  40 mg Oral Q1200  . piperacillin-tazobactam  3.375 g Intravenous Q8H  . sertraline  50 mg Oral QHS  . sodium chloride      . sodium chloride      . vancomycin  1,000 mg Intravenous Q12H  . DISCONTD: vancomycin  750 mg Intravenous Q12H   Continuous Infusions:   . sodium chloride    . DISCONTD: heparin 20 Units/kg/hr (01/18/11 1643)  . DISCONTD: heparin 1,200 Units/hr (01/18/11 2245)   PRN Meds:.cyclobenzaprine, HYDROcodone-acetaminophen, HYDROmorphone (DILAUDID) injection, lidocaine-prilocaine, LORazepam, ondansetron, senna-docusate, DISCONTD:  HYDROmorphone (DILAUDID) injection  Assessment/Plan:  Mr Mehring is a 60 y/o male with hx fo adenocarcinoma of colon ( mucinous ) diagnosed in may 2012 followed by surgical resection and currently getting chemotherapy, hx of PE in 2011 ( post [facility] with coumadin for 6 mths) , hx of recent hemoptysis sec  to erosive esophagitis , HTN, hx of TIA, active smoker, who presented to ED with acute onset substernal chest pain which woke him up from sleep. patient has been feeling feverish with cough and SOB since 1 day. evaluation in ED with CT angio chest to r/o PE suggested a rt middle lobe tiny embolus and a left sided infiltrate.  Patientadmitted to telemetry for HCAP, acute PE and rule out ACS.   Plan:   Healthcare associated  Pneumonia ( likely gm negative bacteria)  Patient had recent hx of PNA treated as outpt with augmentin . Given hx of SOB and fevers associated with productive cough and immunocompromised state with left lung infiltrate on CT, being treated with IV vanco and zosyn for HCAP. ( DAY 3)  blood cx sent from ED so far negative monitor WBC and temp curve, remains afebrile and clinically improving Follow sputum cx   Rt middle lobe PE  patient has hx of PE in 2011 and completed 6 mths of coumadin. He is coagulopathic with his underlying malignancy. Patient was started on IV heparin in ED and will continue for now. however given his recent hx of hematemesis with erosive esophagitis , it definitely puts him at risk for bleeding .Discussed with his oncologist Dr Worthy Flank who agrees on holding off on coumadin and to send him on sq lovenox instead.   IV heaprin swithced to sq lovenox with plan on lovenox teaching. He can be sent home on 1.5 mg/ kg of sq lovenox once a day . Home health for lovenox beaing set up.  Erosive gastritis with hx of hematemesis  Denies any symptoms at present . likely secondary to active smoking and etoh., although he denies drinking for few months.  Cont with PPI  Will closely monitor   Hx of adenocarcinoma  Patient follwoed by Dr Worthy Flank and gets [facility] as outpt. He is due for his next chemo today  Discussed with his oncologist over the phone today and agrees with current plan .  Pancytopenia  Slow drop in platelets noted, however  his anemia and  thrombocytopenia  Is currently stable.  Hypokalemia and low bicarb  Has AG acidosis likely from GI loss. He gives hx of watery BMs 2-3 times daily for few weeks  kcl supplemented and switched  fluids to D5W with 1 amp bicarb on 10/29 with improvement on bicarb monitor labs in am  Diet : regular   DVT prophylaxis: on sq lovenox ( therapeutic) Code status: full  code   Dispo: home likely in next 1-2 days if continues to improve clinically . Will be discharged on po antibiotics and sq lovenox for PE for 3-6 months    LOS: 2 days   Xandrea Clarey 01/19/2011, 11:40 AM

## 2011-01-20 ENCOUNTER — Encounter (HOSPITAL_COMMUNITY): Payer: Federal, State, Local not specified - PPO

## 2011-01-20 LAB — CBC
HCT: 26.3 % — ABNORMAL LOW (ref 39.0–52.0)
MCHC: 32.7 g/dL (ref 30.0–36.0)
Platelets: 103 10*3/uL — ABNORMAL LOW (ref 150–400)
RDW: 22.5 % — ABNORMAL HIGH (ref 11.5–15.5)
WBC: 6.5 10*3/uL (ref 4.0–10.5)

## 2011-01-20 NOTE — Progress Notes (Signed)
ANTIBIOTIC CONSULT NOTE   Pharmacy Consult for Vancomycin and Zosyn Indication:  HCAP  No Known Allergies  Patient Measurements: Height: 5\' 8"  (172.7 cm) Weight: 118 lb 11.2 oz (53.842 kg) IBW/kg (Calculated) : 68.4  Adjusted Body Weight: n/a  Vital Signs: Temp: 98.6 F (37 C) (10/31 0605) Temp src: Oral (10/31 0605) BP: 120/82 mmHg (10/31 0605) Pulse Rate: 89  (10/31 0605) Intake/Output from previous day: 10/30 0701 - 10/31 0700 In: 490 [P.O.:240; IV Piggyback:250] Out: 1350 [Urine:1350] Intake/Output from this shift:    Labs:  Basename 01/20/11 0530 01/19/11 0606 01/18/11 0534  WBC 6.5 7.3 7.0  HGB 8.6* 8.6* 9.2*  PLT 103* 99* 97*  LABCREA -- -- --  CREATININE -- 0.87 1.03   Estimated Creatinine Clearance: 69.6 ml/min (by C-G formula based on Cr of 0.87).  Results for EREN, RYSER (MRN [id number]) as of 01/18/2011 15:30  Ref. Range 01/18/2011 13:24  Vancomycin Tr Latest Range: 10.0-20.0 ug/mL 11.9    Microbiology: Recent Results (from the past 720 hour(s))  CULTURE, BLOOD (ROUTINE X 2)     Status: Normal (Preliminary result)   Collection Time   01/17/11 10:16 AM      Component Value Range Status Comment   Specimen Description BLOOD LEFT ANTECUBITAL   Final    Special Requests     Final    Value: BOTTLES DRAWN AEROBIC AND ANAEROBIC 8CC EACH BOTTLE   Culture NO GROWTH 2 DAYS   Final    Report Status PENDING   Incomplete   CULTURE, BLOOD (ROUTINE X 2)     Status: Normal (Preliminary result)   Collection Time   01/17/11 10:42 AM      Component Value Range Status Comment   Specimen Description BLOOD PORTA CATH DRAWN BY RN   Final    Special Requests     Final    Value: BOTTLES DRAWN AEROBIC AND ANAEROBIC 8CC EACH BOTTLE   Culture NO GROWTH 2 DAYS   Final    Report Status PENDING   Incomplete     Medical History: Past Medical History  Diagnosis Date  . 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 Pulmonary embolism 02/2010  .  Acid reflux   . Hypertension   . Osteoporosis   . Arthritis   . TIA (transient ischemic attack) 10/11  . ETOH abuse     quit 03/2010  . 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 Personal history of PE (pulmonary embolism) 10/01/2010  . Blood transfusion   . S/P endoscopy September 28, 2010    erosive reflux esophagitis, Billroth I anatomy  . Angina   . Shortness of breath   . Sleep apnea   . Recurrent upper respiratory infection (URI)   . Anxiety   . Pneumonia   . Anemia     Medications:  Noted and reviewed.  Assessment: Initial empiric therapy for HCAP- immunocompromised patient.  Goal of Therapy:  Vancomycin trough level 15-20 mcg/ml  Plan:  Vancomycin 1000 mg IV every 12 hours. Check trough tomorrow Zosyn 3.375 grams IV every 8 hours. Labs per protocol    [name] A 01/20/2011,9:35 AM

## 2011-01-20 NOTE — Progress Notes (Addendum)
Subjective: This 60 year old man, who is known to our hospital, presented with pulmonary embolism and a pneumonia. He seemed to improve but still feels weak and dyspneic on minimal exertion. He is on full dose Lovenox and antibiotics. He has stage III colon cancer and has been receiving chemotherapy by Dr. Mariel Sleet.           Physical Exam: Blood pressure 120/82, pulse 89, temperature 98.6 F (37 C), temperature source Oral, resp. rate 18, height 5\' 8"  (1.727 m), weight 53.842 kg (118 lb 11.2 oz), SpO2 100.00%. He looks chronically sick and is cachectic. He does not appear to have increased work of breathing. There is no peripheral central cyanosis. Lung fields show crackles in both mid and lower zones which are scattered. There is no bronchial breathing. There is no pleural rub. Heart sounds are present and normal without murmurs. He is alert and orientated.   Investigations: Results for orders placed during the hospital encounter of 01/17/11 (from the past 48 hour(s))  VANCOMYCIN, TROUGH     Status: Normal   Collection Time   01/18/11  1:24 PM      Component Value Range Comment   Vancomycin Tr 11.9  10.0 - 20.0 (ug/mL)   HEPARIN LEVEL     Status: Abnormal   Collection Time   01/18/11  1:24 PM      Component Value Range Comment   Heparin Unfractionated 0.12 (*) 0.30 - 0.70 (IU/mL)   HEPARIN LEVEL     Status: Abnormal   Collection Time   01/18/11  9:33 PM      Component Value Range Comment   Heparin Unfractionated 0.29 (*) 0.30 - 0.70 (IU/mL)   HEPARIN LEVEL     Status: Normal   Collection Time   01/19/11  6:06 AM      Component Value Range Comment   Heparin Unfractionated 0.63  0.30 - 0.70 (IU/mL)   CBC     Status: Abnormal   Collection Time   01/19/11  6:06 AM      Component Value Range Comment   WBC 7.3  4.0 - 10.5 (K/uL)    RBC 2.79 (*) 4.22 - 5.81 (MIL/uL)    Hemoglobin 8.6 (*) 13.0 - 17.0 (g/dL)    HCT 16.1 (*) 09.6 - 52.0 (%)    MCV 93.2  78.0 - 100.0 (fL)    MCH 30.8  26.0 - 34.0 (pg)    MCHC 33.1  30.0 - 36.0 (g/dL)    RDW 04.5 (*) 40.9 - 15.5 (%)    Platelets 99 (*) 150 - 400 (K/uL)   BASIC METABOLIC PANEL     Status: Abnormal   Collection Time   01/19/11  6:06 AM      Component Value Range Comment   Sodium 134 (*) 135 - 145 (mEq/L)    Potassium 4.6  3.5 - 5.1 (mEq/L)    Chloride 107  96 - 112 (mEq/L)    CO2 18 (*) 19 - 32 (mEq/L)    Glucose, Bld 132 (*) 70 - 99 (mg/dL)    BUN 6  6 - 23 (mg/dL)    Creatinine, Ser 8.11  0.50 - 1.35 (mg/dL)    Calcium 8.7  8.4 - 10.5 (mg/dL)    GFR calc non Af Amer >90  >90 (mL/min)    GFR calc Af Amer >90  >90 (mL/min)   CBC     Status: Abnormal   Collection Time   01/20/11  5:30 AM  Component Value Range Comment   WBC 6.5  4.0 - 10.5 (K/uL)    RBC 2.83 (*) 4.22 - 5.81 (MIL/uL)    Hemoglobin 8.6 (*) 13.0 - 17.0 (g/dL)    HCT 11.9 (*) 14.7 - 52.0 (%)    MCV 92.9  78.0 - 100.0 (fL)    MCH 30.4  26.0 - 34.0 (pg)    MCHC 32.7  30.0 - 36.0 (g/dL)    RDW 82.9 (*) 56.2 - 15.5 (%)    Platelets 103 (*) 150 - 400 (K/uL)    Recent Results (from the past 240 hour(s))  CULTURE, BLOOD (ROUTINE X 2)     Status: Normal (Preliminary result)   Collection Time   01/17/11 10:16 AM      Component Value Range Status Comment   Specimen Description BLOOD LEFT ANTECUBITAL   Final    Special Requests     Final    Value: BOTTLES DRAWN AEROBIC AND ANAEROBIC 8CC EACH BOTTLE   Culture NO GROWTH 3 DAYS   Final    Report Status PENDING   Incomplete   CULTURE, BLOOD (ROUTINE X 2)     Status: Normal (Preliminary result)   Collection Time   01/17/11 10:42 AM      Component Value Range Status Comment   Specimen Description BLOOD PORTA CATH DRAWN BY RN   Final    Special Requests     Final    Value: BOTTLES DRAWN AEROBIC AND ANAEROBIC 8CC EACH BOTTLE   Culture NO GROWTH 3 DAYS   Final    Report Status PENDING   Incomplete     No results found.    Medications: I have reviewed the patient's current  medications.  Impression: 1. Right lower lobe pulmonary embolism with small clot burden. Patient is on Megace. 2. Probable left upper lobe pneumonia on CT chest scan. 3. Stage III colon cancer. No further chemotherapy per oncology. 4. History of previous GI bleed from erosive esophagitis. No evidence of current bleeding. 5. Mild acidosis, metabolic. 6. Chronic abdominal pain secondary to multiple abdominal surgeries.     Plan: 1. Continue full dose Lovenox anticoagulation. He will need to continue this for 3-6 months. 2. Discontinue Megace. 3. Discharge when medically stable, hopefully in the next 1-2 days.     LOS: 3 days   Scott Fix C 01/20/2011, 12:23 PM

## 2011-01-21 ENCOUNTER — Inpatient Hospital Stay (HOSPITAL_COMMUNITY): Payer: Federal, State, Local not specified - PPO

## 2011-01-21 ENCOUNTER — Ambulatory Visit (HOSPITAL_COMMUNITY): Payer: Federal, State, Local not specified - PPO | Admitting: Oncology

## 2011-01-21 DIAGNOSIS — Z7901 Long term (current) use of anticoagulants: Secondary | ICD-10-CM

## 2011-01-21 DIAGNOSIS — J189 Pneumonia, unspecified organism: Secondary | ICD-10-CM

## 2011-01-21 DIAGNOSIS — I2699 Other pulmonary embolism without acute cor pulmonale: Secondary | ICD-10-CM

## 2011-01-21 DIAGNOSIS — C189 Malignant neoplasm of colon, unspecified: Secondary | ICD-10-CM

## 2011-01-21 LAB — COMPREHENSIVE METABOLIC PANEL
ALT: 6 U/L (ref 0–53)
AST: 14 U/L (ref 0–37)
Alkaline Phosphatase: 74 U/L (ref 39–117)
CO2: 27 mEq/L (ref 19–32)
Chloride: 102 mEq/L (ref 96–112)
GFR calc Af Amer: >90 mL/min (ref >90)
GFR calc non Af Amer: 88 mL/min — ABNORMAL LOW (ref >90)
Glucose, Bld: 84 mg/dL (ref 70–99)
Potassium: 5.5 mEq/L — ABNORMAL HIGH (ref 3.5–5.1)
Sodium: 135 mEq/L (ref 135–145)
Total Bilirubin: 0.2 mg/dL — ABNORMAL LOW (ref 0.3–1.2)

## 2011-01-21 LAB — CBC
Hemoglobin: 9.2 g/dL — ABNORMAL LOW (ref 13.0–17.0)
MCH: 31.7 pg (ref 26.0–34.0)
Platelets: 122 10*3/uL — ABNORMAL LOW (ref 150–400)
RBC: 2.9 MIL/uL — ABNORMAL LOW (ref 4.22–5.81)
WBC: 5.7 10*3/uL (ref 4.0–10.5)

## 2011-01-21 MED ORDER — VANCOMYCIN HCL 1000 MG IV SOLR
1500.0000 mg | INTRAVENOUS | Status: DC
  Filled 2011-01-21 (×4): qty 1500

## 2011-01-21 NOTE — Progress Notes (Signed)
Subjective: This 60 year old man, who is known to our hospital, presented with pulmonary embolism and a pneumonia. He seemed to improve but still feels weak and dyspneic on minimal exertion. He is on full dose Lovenox and antibiotics. He has stage III colon cancer and has been receiving chemotherapy by Dr. Mariel Sleet. Yesterday, his Megace was discontinued in view of thromboembolic disease and relatively good appetite. Today, he says he feels weak and his concern is that his pneumonia may return if he gets discharged home to early.           Physical Exam: Blood pressure 132/98, pulse 80, temperature 98.5 F (36.9 C), temperature source Oral, resp. rate 20, height 5\' 8"  (1.727 m), weight 53.842 kg (118 lb 11.2 oz), SpO2 99.00%. He looks chronically sick and is cachectic. He does not appear to have increased work of breathing. There is no peripheral central cyanosis. Lung fields show crackles in both mid and lower zones which are scattered. There is no bronchial breathing. There is no pleural rub. Heart sounds are present and normal without murmurs. He is alert and orientated.   Investigations: Results for orders placed during the hospital encounter of 01/17/11 (from the past 48 hour(s))  CBC     Status: Abnormal   Collection Time   01/20/11  5:30 AM      Component Value Range Comment   WBC 6.5  4.0 - 10.5 (K/uL)    RBC 2.83 (*) 4.22 - 5.81 (MIL/uL)    Hemoglobin 8.6 (*) 13.0 - 17.0 (g/dL)    HCT 40.9 (*) 81.1 - 52.0 (%)    MCV 92.9  78.0 - 100.0 (fL)    MCH 30.4  26.0 - 34.0 (pg)    MCHC 32.7  30.0 - 36.0 (g/dL)    RDW 91.4 (*) 78.2 - 15.5 (%)    Platelets 103 (*) 150 - 400 (K/uL)   CBC     Status: Abnormal   Collection Time   01/21/11  5:21 AM      Component Value Range Comment   WBC 5.7  4.0 - 10.5 (K/uL)    RBC 2.90 (*) 4.22 - 5.81 (MIL/uL)    Hemoglobin 9.2 (*) 13.0 - 17.0 (g/dL)    HCT 95.6 (*) 21.3 - 52.0 (%)    MCV 93.4  78.0 - 100.0 (fL)    MCH 31.7  26.0 - 34.0 (pg)      MCHC 33.9  30.0 - 36.0 (g/dL)    RDW 08.6 (*) 57.8 - 15.5 (%)    Platelets 122 (*) 150 - 400 (K/uL)   COMPREHENSIVE METABOLIC PANEL     Status: Abnormal   Collection Time   01/21/11  5:21 AM      Component Value Range Comment   Sodium 135  135 - 145 (mEq/L)    Potassium 5.5 (*) 3.5 - 5.1 (mEq/L)    Chloride 102  96 - 112 (mEq/L)    CO2 27  19 - 32 (mEq/L)    Glucose, Bld 84  70 - 99 (mg/dL)    BUN 10  6 - 23 (mg/dL)    Creatinine, Ser 4.69  0.50 - 1.35 (mg/dL)    Calcium 9.5  8.4 - 10.5 (mg/dL)    Total Protein 5.7 (*) 6.0 - 8.3 (g/dL)    Albumin 2.7 (*) 3.5 - 5.2 (g/dL)    AST 14  0 - 37 (U/L)    ALT 6  0 - 53 (U/L)    Alkaline Phosphatase 74  39 - 117 (U/L)    Total Bilirubin 0.2 (*) 0.3 - 1.2 (mg/dL)    GFR calc non Af Amer 88 (*) >90 (mL/min)    GFR calc Af Amer >90  >90 (mL/min)    Recent Results (from the past 240 hour(s))  CULTURE, BLOOD (ROUTINE X 2)     Status: Normal (Preliminary result)   Collection Time   01/17/11 10:16 AM      Component Value Range Status Comment   Specimen Description BLOOD LEFT ANTECUBITAL   Final    Special Requests     Final    Value: BOTTLES DRAWN AEROBIC AND ANAEROBIC 8CC EACH BOTTLE   Culture NO GROWTH 3 DAYS   Final    Report Status PENDING   Incomplete   CULTURE, BLOOD (ROUTINE X 2)     Status: Normal (Preliminary result)   Collection Time   01/17/11 10:42 AM      Component Value Range Status Comment   Specimen Description BLOOD PORTA CATH DRAWN BY RN   Final    Special Requests     Final    Value: BOTTLES DRAWN AEROBIC AND ANAEROBIC 8CC EACH BOTTLE   Culture NO GROWTH 3 DAYS   Final    Report Status PENDING   Incomplete     No results found.    Medications: I have reviewed the patient's current medications.  Impression: 1. Right lower lobe pulmonary embolism with small clot burden. Patient is on Megace. 2. Probable left upper lobe pneumonia on CT chest scan. 3. Stage III colon cancer. No further chemotherapy per  oncology. 4. History of previous GI bleed from erosive esophagitis. No evidence of current bleeding. 5. Mild acidosis, metabolic, resolved. 6. Chronic abdominal pain secondary to multiple abdominal surgeries.     Plan: 1. Continue full dose Lovenox anticoagulation. He will need to continue this for 3-6 months. 2. Repeat chest x-ray today. 3. Discontinue intravenous fluids with bicarbonate. 4. Probable discharge home tomorrow.    LOS: 4 days   Yoali Conry C 01/21/2011, 11:11 AM

## 2011-01-21 NOTE — Progress Notes (Signed)
[facility] Oncology Progress Note  Name: [name]      MRN: [id number]    Location: W098/J191-47  Date: 01/21/2011 Time:9:24 AM   Subjective: Interval History:Morgan B Boulden reports that he continues to feel ill.  He does explain that he feels slightly better than the day he was admitted to the hospital.  He reports slight nausea this morning, but he received Zofran and his nausea is improving.  His appetite remains strong.  Dr. Karilyn Cota and I spoke yesterday regarding his Megace.  We decided to discontinue this medication because of his blood clots.  His appetite seems to have improved significantly so we decided to discontinue the Megace.  He does admit to right sided abdominal pain which is a chronic issue for the patient.    Objective: Vital signs in last 24 hours: Temp:  [97.7 F (36.5 C)-98.5 F (36.9 C)] 98.5 F (36.9 C) (11/01 0530) Pulse Rate:  [80-87] 80  (11/01 0530) Resp:  [20] 20  (11/01 0530) BP: (98-132)/(58-98) 132/98 mmHg (11/01 0530) SpO2:  [97 %-99 %] 99 % (11/01 0530)      PHYSICAL EXAM: General appearance: alert, cooperative, appears stated age, cachectic and no distress Neck: supple, symmetrical, trachea midline Lungs: clear to auscultation bilaterally Heart: regular rate and rhythm, S1, S2 normal, no murmur, click, rub or gallop Abdomen: soft, non-tender; bowel sounds normal; no masses,  no organomegaly Extremities: extremities normal, atraumatic, no cyanosis or edema Pulses: 2+ and symmetric Skin: Skin color, texture, turgor normal. No rashes or lesions Neurologic: Grossly normal   Studies/Results: Results for orders placed during the hospital encounter of 01/17/11 (from the past 48 hour(s))  CBC     Status: Abnormal   Collection Time   01/20/11  5:30 AM      Component Value Range Comment   WBC 6.5  4.0 - 10.5 (K/uL)    RBC 2.83 (*) 4.22 - 5.81 (MIL/uL)    Hemoglobin 8.6 (*) 13.0 - 17.0 (g/dL)    HCT 82.9 (*) 56.2 - 52.0 (%)    MCV  92.9  78.0 - 100.0 (fL)    MCH 30.4  26.0 - 34.0 (pg)    MCHC 32.7  30.0 - 36.0 (g/dL)    RDW 13.0 (*) 86.5 - 15.5 (%)    Platelets 103 (*) 150 - 400 (K/uL)   CBC     Status: Abnormal   Collection Time   01/21/11  5:21 AM      Component Value Range Comment   WBC 5.7  4.0 - 10.5 (K/uL)    RBC 2.90 (*) 4.22 - 5.81 (MIL/uL)    Hemoglobin 9.2 (*) 13.0 - 17.0 (g/dL)    HCT 78.4 (*) 69.6 - 52.0 (%)    MCV 93.4  78.0 - 100.0 (fL)    MCH 31.7  26.0 - 34.0 (pg)    MCHC 33.9  30.0 - 36.0 (g/dL)    RDW 29.5 (*) 28.4 - 15.5 (%)    Platelets 122 (*) 150 - 400 (K/uL)   COMPREHENSIVE METABOLIC PANEL     Status: Abnormal   Collection Time   01/21/11  5:21 AM      Component Value Range Comment   Sodium 135  135 - 145 (mEq/L)    Potassium 5.5 (*) 3.5 - 5.1 (mEq/L)    Chloride 102  96 - 112 (mEq/L)    CO2 27  19 - 32 (mEq/L)    Glucose, Bld 84  70 - 99 (mg/dL)  BUN 10  6 - 23 (mg/dL)    Creatinine, Ser 1.61  0.50 - 1.35 (mg/dL)    Calcium 9.5  8.4 - 10.5 (mg/dL)    Total Protein 5.7 (*) 6.0 - 8.3 (g/dL)    Albumin 2.7 (*) 3.5 - 5.2 (g/dL)    AST 14  0 - 37 (U/L)    ALT 6  0 - 53 (U/L)    Alkaline Phosphatase 74  39 - 117 (U/L)    Total Bilirubin 0.2 (*) 0.3 - 1.2 (mg/dL)    GFR calc non Af Amer 88 (*) >90 (mL/min)    GFR calc Af Amer >90  >90 (mL/min)     MEDICATIONS: I have reviewed the patient's current medications.     Assessment/Plan: 1. Pneumonia, on antibiotics 2. PE, on anticoagulation with Lovenox 3. Anticoagulation, recommend anticoagulation with Lovenox 1 mg/kg daily upon discharge due to his history of hematemesis, GI bleeding, and other bleeding problems.  Would recommend avoiding BID dosing.  Would not recommend coumadin anticoagulation. 4. Stage IIIB Colon Cancer, S/P resection and 7 cycles of FOLFOX chemotherpay.  It was decided to discontinue chemotherapy due complications following each cycle of chemotherapy. 5. Discontinue Megace.   All questions were answered. The  patient knows to call the clinic with any problems, questions or concerns. We can certainly see the patient much sooner if necessary.   KEFALAS,THOMAS

## 2011-01-21 NOTE — Progress Notes (Signed)
ANTIBIOTIC CONSULT NOTE   Pharmacy Consult for Vancomycin and Zosyn Indication:  HCAP  No Known Allergies  Patient Measurements: Height: 5\' 8"  (172.7 cm) Weight: 118 lb 11.2 oz (53.842 kg) IBW/kg (Calculated) : 68.4  Adjusted Body Weight: n/a  Vital Signs: Temp: 98 F (36.7 C) (11/01 1400) Temp src: Oral (11/01 1400) BP: 105/68 mmHg (11/01 1400) Pulse Rate: 90  (11/01 1400) Intake/Output from previous day:   Intake/Output from this shift: Total I/O In: 290 [P.O.:290] Out: 1200 [Urine:1200]  Labs: RECENT VANCOMYCIN TROUGH LEVELS: Recent Labs  Basename 01/21/11 1423   VANCOTROUGH 30.6*     Basename 01/21/11 0521 01/20/11 0530 01/19/11 0606  WBC 5.7 6.5 7.3  HGB 9.2* 8.6* 8.6*  PLT 122* 103* 99*  LABCREA -- -- --  CREATININE 0.99 -- 0.87   Estimated Creatinine Clearance: 61.1 ml/min (by C-G formula based on Cr of 0.99).  Results for JUVENCIO, VERDI (MRN [id number]) as of 01/18/2011 15:30  Ref. Range 01/18/2011 13:24  Vancomycin Tr Latest Range: 10.0-20.0 ug/mL 11.9    Microbiology: Recent Results (from the past 720 hour(s))  CULTURE, BLOOD (ROUTINE X 2)     Status: Normal (Preliminary result)   Collection Time   01/17/11 10:16 AM      Component Value Range Status Comment   Specimen Description BLOOD LEFT ANTECUBITAL   Final    Special Requests     Final    Value: BOTTLES DRAWN AEROBIC AND ANAEROBIC 8CC EACH BOTTLE   Culture NO GROWTH 4 DAYS   Final    Report Status PENDING   Incomplete   CULTURE, BLOOD (ROUTINE X 2)     Status: Normal (Preliminary result)   Collection Time   01/17/11 10:42 AM      Component Value Range Status Comment   Specimen Description BLOOD PORTA CATH DRAWN BY RN   Final    Special Requests     Final    Value: BOTTLES DRAWN AEROBIC AND ANAEROBIC 8CC EACH BOTTLE   Culture NO GROWTH 4 DAYS   Final    Report Status PENDING   Incomplete     Medical History: Past Medical History  Diagnosis Date  . Adenocarcinoma of colon with  mucinous features 07/2010    Stage 3  . Pulmonary embolism 02/2010  . Acid reflux   . Hypertension   . Osteoporosis   . Arthritis   . TIA (transient ischemic attack) 10/11  . ETOH abuse     quit 03/2010  . 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 Personal history of PE (pulmonary embolism) 10/01/2010  . Blood transfusion   . S/P endoscopy September 28, 2010    erosive reflux esophagitis, Billroth I anatomy  . Angina   . Shortness of breath   . Sleep apnea   . Recurrent upper respiratory infection (URI)   . Anxiety   . Pneumonia   . Anemia     Medications:  Noted and reviewed.  Assessment: Trough level above goal.  Goal of Therapy:  Vancomycin trough level 15-20 mcg/ml  Plan: Change vancomycin to 1500mg  iv q24hrs Continue Zosyn 3.375 grams IV every 8 hours. Labs per protocol    [name] A 01/21/2011,4:01 PM

## 2011-01-21 NOTE — Progress Notes (Signed)
[facility] Chart Review Completed  

## 2011-01-22 DIAGNOSIS — C189 Malignant neoplasm of colon, unspecified: Secondary | ICD-10-CM

## 2011-01-22 DIAGNOSIS — I2699 Other pulmonary embolism without acute cor pulmonale: Secondary | ICD-10-CM

## 2011-01-22 DIAGNOSIS — F609 Personality disorder, unspecified: Secondary | ICD-10-CM

## 2011-01-22 DIAGNOSIS — J189 Pneumonia, unspecified organism: Secondary | ICD-10-CM

## 2011-01-22 LAB — CBC
MCH: 31.5 pg (ref 26.0–34.0)
MCHC: 33.1 g/dL (ref 30.0–36.0)
MCV: 95.3 fL (ref 78.0–100.0)
Platelets: 143 10*3/uL — ABNORMAL LOW (ref 150–400)
RBC: 2.79 MIL/uL — ABNORMAL LOW (ref 4.22–5.81)

## 2011-01-22 LAB — CULTURE, BLOOD (ROUTINE X 2)

## 2011-01-22 LAB — COMPREHENSIVE METABOLIC PANEL
ALT: 5 U/L (ref 0–53)
AST: 15 U/L (ref 0–37)
CO2: 29 mEq/L (ref 19–32)
Calcium: 8.9 mg/dL (ref 8.4–10.5)
Creatinine, Ser: 1.07 mg/dL (ref 0.50–1.35)
GFR calc Af Amer: 86 mL/min — ABNORMAL LOW (ref >90)
GFR calc non Af Amer: 74 mL/min — ABNORMAL LOW (ref >90)
Sodium: 138 mEq/L (ref 135–145)
Total Protein: 5.6 g/dL — ABNORMAL LOW (ref 6.0–8.3)

## 2011-01-22 MED ORDER — MOXIFLOXACIN HCL 400 MG PO TABS: 400.0000 mg | ORAL_TABLET | Freq: Every day | ORAL | Status: AC

## 2011-01-22 MED ORDER — ENOXAPARIN SODIUM 60 MG/0.6ML ~~LOC~~ SOLN: 1.0000 mg/kg | SUBCUTANEOUS | Status: DC

## 2011-01-22 NOTE — Progress Notes (Signed)
Subjective:  The patient reports that he continues to feel ill, but he is unable to clearly illustrate what is bothering him.   He continues to complain about his chronic abdominal discomfort.  Otherwise he denies any other complaints, or at least he is unable to clearly identify what feels ill.    He denies any vomiting, fevers, chills, night sweats.    Interestingly, last night as I was exiting the Hospital, the patient's wife and I began talking about Mr. Keilman.  She understands that we are going to discontinue chemotherapy for his colon cancer.  She brings up an interesting matter regarding Libero's behavior.  In the past, Mr. Talerico was very neat and clean.  "He would freak if there was even a little dust on the dashboard of his truck. Now he barely has a spot for him to sit in his truck," his wife tells me.  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  He attempts to hide these purchases from his wife.  He does not consume the animals he buys and has them for no practical use.  She worries that he is becoming a "hoarder" because that is what his mother was.  She explains, "Have you ever seen the show hoarders?  Well that was exactly what Donta's mother was!"  She also is concerned about his frequent visits to the hospital.  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  She explains that in the ER and hospital, she is fearful that he receives powerful narcotics for his pain.  This is a concern of hers.   I wonder if we should get a psychiatrist/psychologist involved with this patient's care on an outpatient basis.    Objective: Vital signs in last 24 hours: Temp:  [97.9 F (36.6 C)-98.3 F (36.8 C)] 98.3 F (36.8 C) (11/02 0452) Pulse Rate:  [77-90] 77  (11/02 0452) Resp:  [20-22] 20  (11/02 0452) BP: (105-117)/(68-77) 108/74 mmHg (11/02 0452) SpO2:  [95 %-100 %] 99 % (11/02  0452)  Intake/Output from previous day: 11/01 0701 - 11/02 0700 In: 650 [P.O.:650] Out: 1450 [Urine:1450] Intake/Output this shift:    General appearance: alert, cooperative, appears stated age, cachectic and no distress Resp: clear to auscultation bilaterally Cardio: regular rate and rhythm, S1, S2 normal, no murmur, click, rub or gallop GI: soft, diffuse tenderness, positive bowel sounds in all 4 quadrants Extremities: extremities normal, atraumatic, no cyanosis or edema  Lab Results:   [facility] 01/22/11 0507 01/21/11 0521  WBC 6.0 5.7  HGB 8.8* 9.2*  HCT 26.6* 27.1*  PLT 143* 122*   BMET  Basename 01/22/11 0507 01/21/11 0521  NA 138 135  K 5.0 5.5*  CL 101 102  CO2 29 27  GLUCOSE 95 84  BUN 11 10  CREATININE 1.07 0.99  CALCIUM 8.9 9.5    Studies/Results: Dg Chest 2 View  01/21/2011  *RADIOLOGY REPORT*  Clinical Data: Follow-up pneumonia.  The patient recently diagnosed with right lower lobe pulmonary embolism.  Patchy opacity in the left upper lobe is seen on recent CTA chest of 01/17/2011.  CHEST - 2 VIEW  Comparison: CTA chest 01/17/2011.  Chest radiograph 01/09/2011 and 01/06/2011  Findings: Left subclavian Port-A-Cath terminates in the proximal superior vena cava.  Heart size is normal.  Thoracic aorta mildly tortuous and stable.  Normal pulmonary vascularity.  The lungs are well expanded and clear.  No airspace disease is  identified by chest radiograph.  There is no pleural effusion or pneumothorax. Surgical clips project over the upper abdomen.  There is mild diffuse gaseous distention of the visualized bowel loops in the upper abdomen.  No acute bony abnormalities identified.  IMPRESSION:   No radiographic evidence of acute cardiopulmonary disease.  Original Report Authenticated By: Britta Mccreedy, M.D.    Medications: I have reviewed the patient's current medications.  Assessment/Plan: 1. Stage IIIB Colon cancer, S/P resection followed by 7 cycles of FOLFOX  chemotherapy.  This was decided to be discontinued chemotherapy due to complications following each cycle of therapy. 2. 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  Recommend psychiatry/psychology evaluation as an outpatient.  See subjective section above for more details. 3. Pneumonia 4. PE, on Lovenox anticoagulation, recommend 1 mg/kg daily due to history of GI bleeding, hematemesis, and other bleeding issues. Would not recommend coumadin due to above. 5. Upon discharge, please call the Cancer Center for follow-up appointment in one month's time.     LOS: 5 days    KEFALAS,THOMAS 01/22/2011

## 2011-01-22 NOTE — Progress Notes (Signed)
ANTIBIOTIC CONSULT NOTE   Pharmacy Consult for Vancomycin and Zosyn Indication:  HCAP  No Known Allergies  Patient Measurements: Height: 5\' 8"  (172.7 cm) Weight: 118 lb 11.2 oz (53.842 kg) IBW/kg (Calculated) : 68.4  Adjusted Body Weight: n/a  Vital Signs: Temp: 98.3 F (36.8 C) (11/02 0452) BP: 108/74 mmHg (11/02 0452) Pulse Rate: 77  (11/02 0452) Intake/Output from previous day: 11/01 0701 - 11/02 0700 In: 650 [P.O.:650] Out: 1450 [Urine:1450] Intake/Output from this shift:    Labs: RECENT VANCOMYCIN TROUGH LEVELS: Recent Labs  Basename 01/21/11 1423   VANCOTROUGH 30.6*     Basename 01/22/11 0507 01/21/11 0521 01/20/11 0530  WBC 6.0 5.7 6.5  HGB 8.8* 9.2* 8.6*  PLT 143* 122* 103*  LABCREA -- -- --  CREATININE 1.07 0.99 --   Estimated Creatinine Clearance: 56.6 ml/min (by C-G formula based on Cr of 1.07).  Microbiology: Recent Results (from the past 720 hour(s))  CULTURE, BLOOD (ROUTINE X 2)     Status: Normal (Preliminary result)   Collection Time   01/17/11 10:16 AM      Component Value Range Status Comment   Specimen Description BLOOD LEFT ANTECUBITAL   Final    Special Requests     Final    Value: BOTTLES DRAWN AEROBIC AND ANAEROBIC 8CC EACH BOTTLE   Culture NO GROWTH 4 DAYS   Final    Report Status PENDING   Incomplete   CULTURE, BLOOD (ROUTINE X 2)     Status: Normal (Preliminary result)   Collection Time   01/17/11 10:42 AM      Component Value Range Status Comment   Specimen Description BLOOD PORTA CATH DRAWN BY RN   Final    Special Requests     Final    Value: BOTTLES DRAWN AEROBIC AND ANAEROBIC 8CC EACH BOTTLE   Culture NO GROWTH 4 DAYS   Final    Report Status PENDING   Incomplete     Medical History: Past Medical History  Diagnosis Date  . 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 Pulmonary embolism 02/2010  . Acid reflux   . Hypertension   . Osteoporosis   . Arthritis   . TIA (transient ischemic attack)  10/11  . ETOH abuse     quit 03/2010  . 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 Personal history of PE (pulmonary embolism) 10/01/2010  . Blood transfusion   . S/P endoscopy September 28, 2010    erosive reflux esophagitis, [place]th I anatomy  . Angina   . Shortness of breath   . Sleep apnea   . Recurrent upper respiratory infection (URI)   . Anxiety   . Pneumonia   . Anemia    Medications:  Noted and reviewed.  Assessment: CrCl ~ 57 ml/min  Goal of Therapy:  Vancomycin trough level 15-20 mcg/ml  Plan: Continue vancomycin to 1500mg  iv q24hrs Continue Zosyn 3.375 grams IV every 8 hours. Continue Lovenox Labs per protocol F/U trough on Monday (vanco dose modified due to trough)    Margo Aye, Amberley Hamler A 01/22/2011,8:39 AM

## 2011-01-22 NOTE — Discharge Summary (Signed)
Physician Discharge Summary  [name] MRN: [id number] DOB/AGE: April 21, 1950 60 y.o. Primary Care Physician:DAVIDSON,ERIC, MD, MD Admit date: 01/17/2011 Discharge date: 01/22/2011    Discharge Diagnoses:  1. Right lower lobe pulmonary embolism with small clot burden. Megace discontinued. 2. Probable left upper lobe pneumonia on CT chest scan. 3. Stage III colon cancer. No further chemotherapy per oncology. 4. History of previous GI bleed from erosive esophagitis. No evidence of current bleeding. 5. Chronic abdominal pain secondary to multiple abdominal surgeries.   Current Discharge Medication List    START taking these medications   Details  enoxaparin (LOVENOX) 60 MG/0.6ML SOLN Inject 0.55 mLs (55 mg total) into the skin daily. Qty: 16.5 mL, Refills: 0    moxifloxacin (AVELOX) 400 MG tablet Take 1 tablet (400 mg total) by mouth daily. Qty: 5 tablet, Refills: 0      CONTINUE these medications which have NOT CHANGED   Details  CYANOCOBALAMIN IJ Inject 1,000 mcg as directed every 30 (thirty) days.      dexlansoprazole (DEXILANT) 60 MG capsule Take 60 mg by mouth daily.     Associated Diagnoses: Adenocarcinoma of colon; Bleeding gastrointestinal    feeding supplement (ENSURE CLINICAL STRENGTH) LIQD Take 237 mLs by mouth 2 (two) times daily between meals.    fentaNYL (DURAGESIC - DOSED MCG/HR) 25 MCG/HR Place 1 patch onto the skin every 3 (three) days.      folic acid (FOLVITE) 1 MG tablet Take 1 mg by mouth daily.      HYDROmorphone (DILAUDID) 2 MG tablet Take 2 mg by mouth every 6 (six) hours as needed. For pain     lidocaine-prilocaine (EMLA) cream Apply 1 application topically as needed. For port access    LORazepam (ATIVAN) 1 MG tablet Take 1 mg by mouth every 4 (four) hours as needed. Every 3-4 hours prn n/v    olmesartan (BENICAR) 5 MG tablet Take 5 mg by mouth daily.      ondansetron (ZOFRAN) 4 MG tablet Take 4 mg by mouth every 12 (twelve) hours  as needed. For nausea    prochlorperazine (COMPAZINE) 10 MG tablet Take 10 mg by mouth every 6 (six) hours as needed.      prochlorperazine (COMPAZINE) 25 MG suppository Place 25 mg rectally every 12 (twelve) hours as needed. For nausea    sertraline (ZOLOFT) 50 MG tablet Take 50 mg by mouth at bedtime.     thiamine 100 MG tablet Take 100 mg by mouth daily.      !! cyclobenzaprine (FLEXERIL) 10 MG tablet Take 10 mg by mouth as needed. For muscle spasms    !! Cyclobenzaprine HCl (FLEXERIL PO) Take 10 mg by mouth 3 (three) times daily as needed.      HYDROcodone-acetaminophen (NORCO) 5-325 MG per tablet Take 1 tablet by mouth every 6 (six) hours as needed. For pain   Associated Diagnoses: Adenocarcinoma of colon; Bleeding gastrointestinal     !! - Potential duplicate medications found. Please discuss with provider.    STOP taking these medications     megestrol (MEGACE) 40 MG/ML suspension      omeprazole (PRILOSEC) 20 MG capsule      amoxicillin-clavulanate (AUGMENTIN) 875-125 MG per tablet         Discharged Condition: Improved and stable.    Consults: Oncology, Dr. Mariel Sleet.  Significant Diagnostic Studies: Dg Chest 2 View  01/21/2011  *RADIOLOGY REPORT*  Clinical Data: Follow-up pneumonia.  The patient recently diagnosed with right lower lobe pulmonary  embolism.  Patchy opacity in the left upper lobe is seen on recent CTA chest of 01/17/2011.  CHEST - 2 VIEW  Comparison: CTA chest 01/17/2011.  Chest radiograph 01/09/2011 and 01/06/2011  Findings: Left subclavian Port-A-Cath terminates in the proximal superior vena cava.  Heart size is normal.  Thoracic aorta mildly tortuous and stable.  Normal pulmonary vascularity.  The lungs are well expanded and clear.  No airspace disease is identified by chest radiograph.  There is no pleural effusion or pneumothorax. Surgical clips project over the upper abdomen.  There is mild diffuse gaseous distention of the visualized bowel loops in  the upper abdomen.  No acute bony abnormalities identified.  IMPRESSION:   No radiographic evidence of acute cardiopulmonary disease.  Original Report Authenticated By: Britta Mccreedy, M.D.   Dg Chest 2 View  01/06/2011  *RADIOLOGY REPORT*  Clinical Data: Fever.  Cough with sputum production.  CHEST - 2 VIEW  Comparison: CT chest 11/15/2010.  Plain films of the chest 12/11/2010 and 08/02/2010.  Findings: Port-A-Cath is in place.  Previously seen left lower lobe airspace disease has nearly completely resolved.  Right lung is clear.  Lungs appear emphysematous.  No pneumothorax or effusion. Heart size is normal.  IMPRESSION: Near complete resolution of left lower lobe airspace disease.  No new abnormality.  Original Report Authenticated By: Bernadene Bell. Maricela Curet, M.D.   Ct Angio Chest W/cm &/or Wo Cm  01/17/2011  *RADIOLOGY REPORT*  Clinical Data:  Shortness of breath, colon cancer, history of PE.  CT ANGIOGRAPHY CHEST WITH CONTRAST  Technique:  Multidetector CT imaging of the chest was performed using the standard protocol during bolus administration of intravenous contrast.  Multiplanar CT image reconstructions including MIPs were obtained to evaluate the vascular anatomy.  Contrast: OMNIPAQUE IOHEXOL 350 MG/ML IV SOLN  Comparison:  Chest radiograph dated 01/17/2011.  CT chest dated 11/15/2010.  Findings:  Right lower lobe pulmonary embolus (series 9/image 176) extending into smaller subsegmental branches.  Overall clot burden is small.  Mild patchy opacity in the subpleural left upper lobe (series 6/image 45), possibly infectious.  Stable subpleural reticulation with small pulmonary nodules measuring up to 4 mm in the right upper lobe (series 6/image 55). Mild linear scarring at the left lung base.  No pleural effusion or pneumothorax.  Visualized thyroid is mildly 80.  The heart is normal in size.  No pericardial effusion.  Coronary atherosclerosis.  No suspicious mediastinal, hilar, or axillary  lymphadenopathy.  Mild degenerative changes of the visualized thoracolumbar spine.  Review of the MIP images confirms the above findings.  IMPRESSION: Right lower lobe pulmonary embolus.  Overall clot burden is small.  Mild patchy opacity in the subpleural left upper lobe, possibly infectious.  Critical Value/emergent results were called by telephone at the time of interpretation on 01/17/2011  at 0930 hours  to  Dr Vanetta Mulders, who verbally acknowledged these results.  Original Report Authenticated By: Charline Bills, M.D.   Dg Chest Portable 1 View  01/17/2011  *RADIOLOGY REPORT*  Clinical Data: Pain  PORTABLE CHEST - 1 VIEW  Comparison: 01/06/2011  Findings: Power port type left central venous catheter with tip in the SVC.  Normal heart size and pulmonary vascularity.  No focal airspace disease.  No blunting of costophrenic angles.  No pneumothorax.  Surgical clips in the upper abdomen.  Tortuous aorta.  IMPRESSION: No evidence of active pulmonary disease.  Original Report Authenticated By: Marlon Pel, M.D.    Lab Results: Results for orders  placed during the hospital encounter of 01/17/11 (from the past 48 hour(s))  CBC     Status: Abnormal   Collection Time   01/21/11  5:21 AM      Component Value Range Comment   WBC 5.7  4.0 - 10.5 (K/uL)    RBC 2.90 (*) 4.22 - 5.81 (MIL/uL)    Hemoglobin 9.2 (*) 13.0 - 17.0 (g/dL)    HCT 04.5 (*) 40.9 - 52.0 (%)    MCV 93.4  78.0 - 100.0 (fL)    MCH 31.7  26.0 - 34.0 (pg)    MCHC 33.9  30.0 - 36.0 (g/dL)    RDW 81.1 (*) 91.4 - 15.5 (%)    Platelets 122 (*) 150 - 400 (K/uL)   COMPREHENSIVE METABOLIC PANEL     Status: Abnormal   Collection Time   01/21/11  5:21 AM      Component Value Range Comment   Sodium 135  135 - 145 (mEq/L)    Potassium 5.5 (*) 3.5 - 5.1 (mEq/L)    Chloride 102  96 - 112 (mEq/L)    CO2 27  19 - 32 (mEq/L)    Glucose, Bld 84  70 - 99 (mg/dL)    BUN 10  6 - 23 (mg/dL)    Creatinine, Ser 7.82  0.50 - 1.35 (mg/dL)      Calcium 9.5  8.4 - 10.5 (mg/dL)    Total Protein 5.7 (*) 6.0 - 8.3 (g/dL)    Albumin 2.7 (*) 3.5 - 5.2 (g/dL)    AST 14  0 - 37 (U/L)    ALT 6  0 - 53 (U/L)    Alkaline Phosphatase 74  39 - 117 (U/L)    Total Bilirubin 0.2 (*) 0.3 - 1.2 (mg/dL)    GFR calc non Af Amer 88 (*) >90 (mL/min)    GFR calc Af Amer >90  >90 (mL/min)   VANCOMYCIN, TROUGH     Status: Abnormal   Collection Time   01/21/11  2:23 PM      Component Value Range Comment   Vancomycin Tr 30.6 (*) 10.0 - 20.0 (ug/mL)   CBC     Status: Abnormal   Collection Time   01/22/11  5:07 AM      Component Value Range Comment   WBC 6.0  4.0 - 10.5 (K/uL)    RBC 2.79 (*) 4.22 - 5.81 (MIL/uL)    Hemoglobin 8.8 (*) 13.0 - 17.0 (g/dL)    HCT 95.6 (*) 21.3 - 52.0 (%)    MCV 95.3  78.0 - 100.0 (fL)    MCH 31.5  26.0 - 34.0 (pg)    MCHC 33.1  30.0 - 36.0 (g/dL)    RDW 08.6 (*) 57.8 - 15.5 (%)    Platelets 143 (*) 150 - 400 (K/uL)   COMPREHENSIVE METABOLIC PANEL     Status: Abnormal   Collection Time   01/22/11  5:07 AM      Component Value Range Comment   Sodium 138  135 - 145 (mEq/L)    Potassium 5.0  3.5 - 5.1 (mEq/L)    Chloride 101  96 - 112 (mEq/L)    CO2 29  19 - 32 (mEq/L)    Glucose, Bld 95  70 - 99 (mg/dL)    BUN 11  6 - 23 (mg/dL)    Creatinine, Ser 4.69  0.50 - 1.35 (mg/dL)    Calcium 8.9  8.4 - 10.5 (mg/dL)    Total Protein 5.6 (*)  6.0 - 8.3 (g/dL)    Albumin 2.6 (*) 3.5 - 5.2 (g/dL)    AST 15  0 - 37 (U/L)    ALT 5  0 - 53 (U/L)    Alkaline Phosphatase 75  39 - 117 (U/L)    Total Bilirubin 0.2 (*) 0.3 - 1.2 (mg/dL)    GFR calc non Af Amer 74 (*) >90 (mL/min)    GFR calc Af Amer 86 (*) >90 (mL/min)    Recent Results (from the past 240 hour(s))  CULTURE, BLOOD (ROUTINE X 2)     Status: Normal (Preliminary result)   Collection Time   01/17/11 10:16 AM      Component Value Range Status Comment   Specimen Description BLOOD LEFT ANTECUBITAL   Final    Special Requests     Final    Value: BOTTLES DRAWN  AEROBIC AND ANAEROBIC 8CC EACH BOTTLE   Culture NO GROWTH 4 DAYS   Final    Report Status PENDING   Incomplete   CULTURE, BLOOD (ROUTINE X 2)     Status: Normal (Preliminary result)   Collection Time   01/17/11 10:42 AM      Component Value Range Status Comment   Specimen Description BLOOD PORTA CATH DRAWN BY RN   Final    Special Requests     Final    Value: BOTTLES DRAWN AEROBIC AND ANAEROBIC 8CC EACH BOTTLE   Culture NO GROWTH 4 DAYS   Final    Report Status PENDING   Incomplete      Hospital Course: This 60 year old man was admitted with chest pain and dyspnea for approximately one day. Please see initial history and physical examination. His initial workup included, including a CT angiogram of the chest showed that he had a right lower lobe pulmonary embolism. There was also suggestion of pneumonia in the left upper lobe. He was therefore anticoagulated with Lovenox and started on intravenous antibiotics with vancomycin and Zosyn. He has done well and made a slow improvement. He continues to feel weak and he says somewhat short of breath. However objectively his vital signs are perfectly normal and he does not have increased work of breathing. Since this is a second pulmonary embolism that he has had now, Megace was discontinued, after discussion with oncology. Also, oncology has decided not to treat him any further with chemotherapy. Today he looks stable.  Discharge Exam: Blood pressure 108/74, pulse 77, temperature 98.3 F (36.8 C), temperature source Oral, resp. rate 20, height 5\' 8"  (1.727 m), weight 53.842 kg (118 lb 11.2 oz), SpO2 99.00%. Cachectic. Chronically sick. Heart sounds are present and normal. Lung fields are clinically clear. There is no pleural. Abdomen is soft nontender. He is alert and orientated without any focal neurological signs.  Disposition: Home. He will continue with Lovenox for anticoagulation for least 3 months at a dose of 1 mg per kilogram per day. He will  follow with oncology in the next week or 2.  Discharge Orders    Future Orders Please Complete By Expires   Diet - low sodium heart healthy      Increase activity slowly         Follow-up Information    Follow up with Randall An, MD. Make an appointment in 1 week.   Contact information:   [address] [place] [address] [phone]          Signed: [name] Singer 01/22/2011, 9:01 AM

## 2011-01-22 NOTE — Telephone Encounter (Signed)
Please reschedule patient. He will need TCS but can wait until chemo complete. Please find out when that will be and make appt for after that.

## 2011-01-25 NOTE — Telephone Encounter (Signed)
Forwarded to [facility].

## 2011-01-26 ENCOUNTER — Encounter: Payer: Self-pay | Admitting: Internal Medicine

## 2011-01-26 NOTE — Telephone Encounter (Signed)
Letter was mailed to patient to call to make OV

## 2011-02-16 ENCOUNTER — Other Ambulatory Visit (HOSPITAL_COMMUNITY): Payer: Self-pay | Admitting: Oncology

## 2011-02-16 ENCOUNTER — Encounter (HOSPITAL_COMMUNITY): Payer: Self-pay | Admitting: Oncology

## 2011-02-16 ENCOUNTER — Ambulatory Visit: Payer: Federal, State, Local not specified - PPO | Admitting: Urgent Care

## 2011-02-16 ENCOUNTER — Encounter (HOSPITAL_COMMUNITY): Payer: Federal, State, Local not specified - PPO | Attending: Oncology | Admitting: Oncology

## 2011-02-16 DIAGNOSIS — R52 Pain, unspecified: Secondary | ICD-10-CM | POA: Insufficient documentation

## 2011-02-16 DIAGNOSIS — K922 Gastrointestinal hemorrhage, unspecified: Secondary | ICD-10-CM | POA: Insufficient documentation

## 2011-02-16 DIAGNOSIS — F1021 Alcohol dependence, in remission: Secondary | ICD-10-CM

## 2011-02-16 DIAGNOSIS — D509 Iron deficiency anemia, unspecified: Secondary | ICD-10-CM

## 2011-02-16 DIAGNOSIS — C189 Malignant neoplasm of colon, unspecified: Secondary | ICD-10-CM

## 2011-02-16 DIAGNOSIS — Z86711 Personal history of pulmonary embolism: Secondary | ICD-10-CM

## 2011-02-16 MED ORDER — FENTANYL 25 MCG/HR TD PT72: 1.0000 | MEDICATED_PATCH | TRANSDERMAL | Status: DC

## 2011-02-16 MED ORDER — HYDROCODONE-ACETAMINOPHEN 5-325 MG PO TABS: 1.0000 | ORAL_TABLET | Freq: Four times a day (QID) | ORAL | Status: DC | PRN

## 2011-02-16 NOTE — Progress Notes (Signed)
[name],ERIC, MD, MD [address] [place] [address]  1. Adenocarcinoma of colon with mucinous features  fentaNYL (DURAGESIC - DOSED MCG/HR) 25 MCG/HR, HYDROcodone-acetaminophen (NORCO) 5-325 MG per tablet, CBC, Differential, CEA  2. Bleeding gastrointestinal  HYDROcodone-acetaminophen (NORCO) 5-325 MG per tablet  3. Pain  fentaNYL (DURAGESIC - DOSED MCG/HR) 25 MCG/HR    CURRENT THERAPY: S/P resection followed by 7 cycles of FOLFOX chemotherapy. This was decided to be discontinued chemotherapy due to complications following each cycle of therapy.   INTERVAL HISTORY: [name] 61 y.o. male returns for  regular  visit for followup of  Stage IIIB Colon cancer.  The patient denies any complaints today. He does report that he needs a refill on his Norco 5/325 mg, Dilaudid, and fentanyl patch 25 mcg per hour. I explained to the patient that I be willing to refill the fentanyl patch and his Norco, but I refused to refill his Dilaudid. This patient concerns me regarding his narcotic usage. He has a strong history of alcohol abuse in the past. He also displays a dependent-like personality.  As a result, I am wary about giving this patient multiple pain medications. I think it is reasonable to give him low-dose long-acting narcotic such as fentanyl in conjunction with a short acting narcotic such as Norco. I've suggested to the patient that if he does not feel like his pain is well-controlled we should refer him to pain management. I explained to him that the pain management specialist are of course experts in the field of pain control.  At this point in time, he has declined this referral. He reports that he believes his pain is well controlled with the current pain medication regimen. The patient is also frequently seen in the emergency department and hospital. I think that this patient's behavior warrants concern about manipulation.  The patient was not accompanied by his wife today,  but she expresses similar concerns in the past.  The patient of course, is seen today in a pleasant mood. As mentioned above he denies any complaints. He reports that he recently saw primary care physician. We did receive a note from his PCP which was greatly appreciated. It is true that biochemically and radiographically, the patient is cancer free. The patient was unable to complete all cycles of chemotherapy due to complications following each in administration of cytotoxic chemotherapy.  He was admitted multiple times to the hospital.    On one of his hospital admissions, the patient was noted to have a smallPulmonary embolism.  It was decided to treat him with Lovenox as of Coumadin do to his history of hematocrit emesis and other bleeding problems. He is now on Lovenox 1 mg per kilogram daily.  We will manage this medication and he should be anticoagulated for approximately 6 months time. At this point time, we do not believe that this recent pulmonary embolism is late into cancer. It is more likely related to his other comorbidities and frequent admissions to the hospital.  I spent some time with the patient going over the game plan.  We will perform laboratory work including a CEA every 3 months. We will monitor a trends and changes in his CEA. At this point time we will not perform surveillance CT scans. The patient unfortunately was unable to tolerate initial chemotherapy and therefore performing surveillance CT scans would be a moot point. Instead of CT scans, as mentioned above, we will follow CEAs on a regular basis. The patient will  continue his Lovenox injections. We will continue to monitor his narcotic usage. He will continue getting port flushes every 6 weeks as per protocol.  Past Medical History  Diagnosis Date  . 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 Pulmonary embolism 02/2010  . Acid reflux   . Hypertension   . Osteoporosis   . Arthritis   . TIA  (transient ischemic attack) 10/11  . ETOH abuse     quit 03/2010  . 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 Personal history of PE (pulmonary embolism) 10/01/2010  . Blood transfusion   . S/P endoscopy September 28, 2010    erosive reflux esophagitis, Billroth I anatomy  . Angina   . Shortness of breath   . Sleep apnea   . Recurrent upper respiratory infection (URI)   . Anxiety   . Pneumonia   . Anemia     has Adenocarcinoma of colon with mucinous features; Bleeding gastrointestinal; Supratherapeutic INR; Abdominal  pain, other specified site; Nausea and vomiting in adult; Anemia, chronic disease; Fever chills; S/P partial gastrectomy; Personal history of PE (pulmonary embolism); Bronchitis, acute; Erosive esophagitis; Esophageal reflux disease; Coffee ground emesis; Hematemesis; Chronic abdominal pain; Pancytopenia due to antineoplastic chemotherapy; Pneumonia; Pulmonary embolism; and Chest pain at rest on his problem list.      has no known allergies.  [name] does not currently have medications on file.  Past Surgical History  Procedure Date  . Hernia repair     right inguinal  . Appendectomy 1980s  . Cholecystectomy 1980s  . Colon surgery May 2012    left  . Portacath placement   . Abdominal sugery     for bowel obstruction x 8  . Esophagogastroduodenoscopy 09/28/2010    Procedure: ESOPHAGOGASTRODUODENOSCOPY (EGD);  Surgeon: Corbin Ade, MD;  Location: [facility];  Service: Endoscopy;  Laterality: N/A;  . Esophagogastroduodenoscopy 12/01/2010    Procedure: ESOPHAGOGASTRODUODENOSCOPY (EGD);  Surgeon: Corbin Ade, MD;  Location: [facility];  Service: Endoscopy;  Laterality: N/A;  3:00    Denies any headaches, dizziness, double vision, fevers, chills, night sweats, nausea, vomiting, diarrhea, constipation, chest pain, heart palpitations, shortness of breath, blood in stool, black tarry stool, urinary pain, urinary burning, urinary frequency, hematuria.   PHYSICAL  EXAMINATION  ECOG PERFORMANCE STATUS: 1 - Symptomatic but completely ambulatory  Filed Vitals:   02/16/11 1549  BP: 138/90  Pulse: 85  Temp: 98.7 F (37.1 C)    GENERAL:alert, no distress, well nourished, well developed, cachectic, comfortable, cooperative and smiling SKIN: skin color, texture, turgor are normal HEAD: Normocephalic EYES: normal EARS: External ears normal OROPHARYNX:mucous membranes are moist  NECK: supple, no adenopathy, trachea midline LYMPH:  no palpable lymphadenopathy BREAST:not examined LUNGS: clear to auscultation  HEART: regular rate & rhythm, no murmurs, no gallops, S1 normal and S2 normal ABDOMEN:abdomen soft, non-tender and normal bowel sounds BACK: Back symmetric, no curvature., No CVA tenderness EXTREMITIES:less then 2 second capillary refill, no joint deformities, effusion, or inflammation, no edema, no skin discoloration, no clubbing, no cyanosis  NEURO: alert & oriented x 3 with fluent speech, no focal motor/sensory deficits, gait normal   LABORATORY DATA: CBC    Component Value Date/Time   WBC 6.0 01/22/2011 0507   RBC 2.79* 01/22/2011 0507   HGB 8.8* 01/22/2011 0507   HCT 26.6* 01/22/2011 0507   PLT 143* 01/22/2011 0507   MCV 95.3 01/22/2011 0507   MCH 31.5 01/22/2011 0507   MCHC  33.1 01/22/2011 0507   RDW 22.7* 01/22/2011 0507   LYMPHSABS 3.1 01/18/2011 0534   MONOABS 0.8 01/18/2011 0534   EOSABS 0.0 01/18/2011 0534   BASOSABS 0.0 01/18/2011 0534    Lab Results  Component Value Date   CEA 4.8 12/21/2010     RADIOGRAPHIC STUDIES:  01/17/11  *RADIOLOGY REPORT*  Clinical Data: Shortness of breath, colon cancer, history of PE.  CT ANGIOGRAPHY CHEST WITH CONTRAST  Technique: Multidetector CT imaging of the chest was performed  using the standard protocol during bolus administration of  intravenous contrast. Multiplanar CT image reconstructions  including MIPs were obtained to evaluate the vascular anatomy.  Contrast: OMNIPAQUE  IOHEXOL 350 MG/ML IV SOLN  Comparison: Chest radiograph dated 01/17/2011. CT chest dated  11/15/2010.  Findings: Right lower lobe pulmonary embolus (series 9/image 176)  extending into smaller subsegmental branches. Overall clot burden  is small.  Mild patchy opacity in the subpleural left upper lobe (series  6/image 45), possibly infectious.  Stable subpleural reticulation with small pulmonary nodules  measuring up to 4 mm in the right upper lobe (series 6/image 55).  Mild linear scarring at the left lung base. No pleural effusion or  pneumothorax.  Visualized thyroid is mildly 80.  The heart is normal in size. No pericardial effusion. Coronary  atherosclerosis.  No suspicious mediastinal, hilar, or axillary lymphadenopathy.  Mild degenerative changes of the visualized thoracolumbar spine.  Review of the MIP images confirms the above findings.  IMPRESSION:  Right lower lobe pulmonary embolus. Overall clot burden is small.  Mild patchy opacity in the subpleural left upper lobe, possibly  infectious.  Critical Value/emergent results were called by telephone at the  time of interpretation on 01/17/2011 at 0930 hours to Dr Vanetta Mulders, who verbally acknowledged these results.  Original Report Authenticated By: Charline Bills, M.D.         ASSESSMENT:  1. Stage IIIB colon cancer, status post resection and status post 7/12 cycles of FOLFOX chemotherapy.   This was discontinued due to repeated complications despite dose reductions. 2. History of alcohol abuse status post detox in January 2012. Abstinent since then.  3. Abdominal pain secondary to multiple abdominal surgeries creating adhesions and scarring.  4. Weight loss, resolved 5. History of pulmonary embolism diagnosed in December of 2011 and again in October 2012.  6. Partial gastrectomy  7. Iron deficiency anemia, status post Feraheme infusion as an inpatient in September of 2012.  8. Questionable failure to thrive,  resolved. 9. Concern about narcotic use. Will monitor closely. Recommend only one physician prescribing pain medication.    PLAN:  1. Refill on Norco 5/325 mg #45 0 refills.  He reports that he may take 1 -2 tablets daily.  This medication must last him one month's time. 2. Refill on Fentanyl 25 mcg/hr change every 3 days #10 with 0 refills. This is a month supply as well. 3. Lab work every 3 months: CBC diff, CEA 4. Port flushes every 6 weeks.  5. Continue Lovenox 1 mg/kg daily for a small PE found on CT angio of chest on 01/17/11.  He should complete 6 months worth of anticoagulation.  We will be more than happy to manage this.  6. Do not recommend Coumadin anticoagulation due to history of bloody/coffe ground emeses in past while on Coumadin. 7. Return to the clinic in 3 months time for follow-up. 8. Will consider pain management referral if pain is not controlled with the above mentioned pain regimen. I  do not recommend an increase in any of his narcotic usage. If this becomes a problem, will certainly refer the patient to pain management for evaluation and control of pain. 9. Would recommend PCP consume pain management due to no oncologic cause of pain.  Would recommend pain management referral if necessary.   All questions were answered. The patient knows to call the clinic with any problems, questions or concerns. We can certainly see the patient much sooner if necessary.  The patient and plan discussed with Glenford Peers, MD and he is in agreement with the aforementioned.   Verdene Creson

## 2011-02-16 NOTE — Patient Instructions (Signed)
[name]  [id number] 1951/02/08   [facility]  Discharge Instructions  RECOMMENDATIONS MADE BY THE CONSULTANT AND ANY TEST RESULTS WILL BE SENT TO YOUR REFERRING DOCTOR.   EXAM FINDINGS BY MD TODAY AND SIGNS AND SYMPTOMS TO REPORT TO CLINIC OR PRIMARY MD:   MEDICATIONS PRESCRIBED: Prescriptions for Fentanyl and Norco were printed and given to you at the time of discharge.  I acknowledge that I have been informed and understand all the instructions given to me and received a copy. I do not have any more questions at this time, but understand that I may call the Specialty Clinic at [facility] at [phone] during business hours should I have any further questions or need assistance in obtaining follow-up care.    __________________________________________  _____________  __________ Signature of Patient or Authorized Representative            Date                   Time    __________________________________________ Nurse's Signature

## 2011-02-16 NOTE — Progress Notes (Addendum)
[name]. Nicotra DOB Jul 29, 2050   Dear Dr. Ignacia Palma,  [name]. [name] is a patient at the [facility] for Stage IIIB Colon cancer.  He is status post resection of his primary cancer.  Pathology revealed one out of 18 lymph nodes positive for metastatic disease. Therefore, the patient was offered adjuvant chemotherapy to decrease the risk of recurrence.  He accepted this offer and was started on  FOLFOX chemotherapy which was administered every 14 days.   He completed 7 out of the 12 anticipated cycles. Unfortunately it was decided to discontinue therapy due to multiple complications requiring hospitalization following each administration of chemotherapy despite dose reductions (as a result, the patient has approximately a 50% chance of recurrence of his disease).  The patient remains biochemically and radiographically disease-free currently.  We will continue to monitor the patient with CEAs every 3 months. We will watch for any upward trend that may be indicative of recurrence.  However, should he recur, therapeutic intervention may be impossible.  The patient has chronic abdominal pain likely secondary to multiple abdominal surgeries causing fibrosis and scarring. Currently he is managed on 25 mcg of fentanyl transdermally  that is changed every 3 days and Norco 5/325 mg 1-2 tablets daily as needed for pain. Today the patient requests a refill of Dilaudid 2 mg in conjunction with the 2 aforementioned medications. I declined to refill his Dilaudid, but I did refill his fentanyl and Norco. I gave him 10 fentanyl patches 25 mcg and 45 tablets of Norco 5/325. Both of these medications are anticipated to last the patient 1 month. Since there is no evidence for disease, we would like you to consider assuming pain management. If unable, we will refer to pain management for control. As you are aware, the patient has a history of alcoholism. According to the patient he has been sober since January  2012. Due to this history, his multiple admissions to the hospital, and his wife's concern, we are worried about addiction and dependence to these medications. The patient likely has a valid reason for pain management due to his multiple abdominal surgeries, but the concern is that he may one day become dependent upon these medications.    On 01/17/2011, it was discovered on CT angiogram of chest during an admission that he had a small pulmonary embolism. This is likely secondary to comorbidities and multiple hospital admissions. In the past, the patient was on Coumadin anticoagulation for a pulmonary embolism discovered in January 2012. While on Coumadin anticoagulation, the patient was admitted multiple times to the [facility] for hematemesis and other bleeding problems.   Since October we recommended that the patient be anticoagulated with a Lovenox 1 mg per kilogram daily regimen for 6 months time.  Please do not hesitate to contact us to discuss him further. We will continue to follow the patient for his history of colon cancer.   Sincerely and with best regards,      Maurine [email or web address]. [email or web address] III, PA-C        Glenford Peers, MD

## 2011-02-17 ENCOUNTER — Ambulatory Visit (INDEPENDENT_AMBULATORY_CARE_PROVIDER_SITE_OTHER): Payer: Federal, State, Local not specified - PPO | Admitting: Urgent Care

## 2011-02-17 ENCOUNTER — Encounter: Payer: Self-pay | Admitting: Urgent Care

## 2011-02-17 DIAGNOSIS — K2210 Ulcer of esophagus without bleeding: Secondary | ICD-10-CM

## 2011-02-17 DIAGNOSIS — C189 Malignant neoplasm of colon, unspecified: Secondary | ICD-10-CM

## 2011-02-17 DIAGNOSIS — K208 Other esophagitis without bleeding: Secondary | ICD-10-CM

## 2011-02-17 NOTE — Patient Instructions (Signed)
I will call you with recommendations regarding colonoscopy Continue Dexilant 60 mg daily Call if you have any problems

## 2011-02-17 NOTE — Assessment & Plan Note (Signed)
Doing well on Dexilant 60 mg daily. He will continue this.

## 2011-02-17 NOTE — Assessment & Plan Note (Signed)
Followed by Dr. Mariel Sleet. Completed 7/12 chemotherapy treatment due to intolerance. He is doing well.  Per current guidelines, he is due for complete colonoscopy of residual colon as he has not had a colonoscopy prior to his surgery for colon cancer. Will discuss this further with Dr. Jena Gauss for his recommendations.

## 2011-02-17 NOTE — Progress Notes (Signed)
Oncologist: Dr. Mariel Sleet Primary Care Physician:  [name], MD, MD Primary Gastroenterologist:  Dr. Jena Gauss  Chief Complaint  Patient presents with  . Follow-up    Erosive reflux esophagitis, history of colon cancer    HPI:  [name] is a 60 y.o. male here for follow up for erosive reflux esophagitis. He also has history of stage III colon cancer in remission. He was diagnosed in May of this year. He underwent exploratory laparotomy left hemicolectomy with primary colonic anastomosis on May 14. He does not believe he ever had a complete colonoscopy.  Pt states he completed 7 chemo treatments & has been told he is cancer free at this point.  No longer on chemo as he finished 5 weeks ago.  Appetite ok.  Denies heartburn or indigestion.  BM daily.  No rectal bleeding or melena.  Taking Dexilant 60 mg daily.  Past Medical History  Diagnosis Date  . 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 Pulmonary embolism 02/2010  . Acid reflux   . Hypertension   . Osteoporosis   . Arthritis   . TIA (transient ischemic attack) 10/11  . ETOH abuse     quit 03/2010  . 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 Personal history of PE (pulmonary embolism) 10/01/2010  . Blood transfusion   . S/P endoscopy September 28, 2010    erosive reflux esophagitis, Billroth I anatomy  . Angina   . Shortness of breath   . Sleep apnea   . Recurrent upper respiratory infection (URI)   . Anxiety   . Pneumonia   . Anemia     Past Surgical History  Procedure Date  . Hernia repair     right inguinal  . Appendectomy 1980s  . Cholecystectomy 1980s  . Colon surgery May 2012    left  . Portacath placement   . Abdominal sugery     for bowel obstruction x 8  . Esophagogastroduodenoscopy 09/28/2010  . Esophagogastroduodenoscopy 12/01/2010    Cervical web status post dilation, erosive esophagitis, B1 hemigastrectomy, inflamed anastomosis    Current Outpatient Prescriptions  Medication Sig  Dispense Refill  . CYANOCOBALAMIN IJ Inject 1,000 mcg as directed every 30 (thirty) days.        . cyclobenzaprine (FLEXERIL) 10 MG tablet Take 10 mg by mouth 3 (three) times daily as needed. For muscle spasms      . dexlansoprazole (DEXILANT) 60 MG capsule Take 60 mg by mouth daily.        [name] enoxaparin (LOVENOX) 60 MG/0.6ML SOLN Inject 0.55 mLs (55 mg total) into the skin daily.  16.5 mL  0  . feeding supplement (ENSURE CLINICAL STRENGTH) LIQD Take 237 mLs by mouth 2 (two) times daily between meals.      . fentaNYL (DURAGESIC - DOSED MCG/HR) 25 MCG/HR Place 1 patch (25 mcg total) onto the skin every 3 (three) days.  10 patch  0  . folic acid (FOLVITE) 1 MG tablet Take 1 mg by mouth daily.        [name] HYDROcodone-acetaminophen (NORCO) 5-325 MG per tablet Take 1 tablet by mouth every 6 (six) hours as needed. For pain  45 tablet  0  . lidocaine-prilocaine (EMLA) cream Apply 1 application topically as needed. For port access      . LORazepam (ATIVAN) 1 MG tablet Take 1 mg by mouth every 4 (four) hours as needed. Every 3-4 hours prn n/v      . olmesartan (  BENICAR) 5 MG tablet Take 5 mg by mouth daily.        . ondansetron (ZOFRAN) 4 MG tablet Take 4 mg by mouth every 12 (twelve) hours as needed. For nausea      . prochlorperazine (COMPAZINE) 10 MG tablet Take 10 mg by mouth every 6 (six) hours as needed.        . prochlorperazine (COMPAZINE) 25 MG suppository Place 25 mg rectally every 12 (twelve) hours as needed. For nausea      . sertraline (ZOLOFT) 50 MG tablet Take 50 mg by mouth at bedtime.       . thiamine 100 MG tablet Take 100 mg by mouth daily.          Allergies as of 02/17/2011  . (No Known Allergies)    Review of Systems: Gen: Denies any fever, chills, sweats, anorexia, fatigue, weakness, malaise, weight loss, and sleep disorder CV: Denies chest pain, angina, palpitations, syncope, orthopnea, PND, peripheral edema, and claudication. Resp: Denies dyspnea at rest, dyspnea with exercise,  cough, sputum, wheezing, coughing up blood, and pleurisy. GI: Denies vomiting blood, jaundice, and fecal incontinence.   Denies dysphagia or odynophagia. Derm: Denies rash, itching, dry skin, hives, moles, warts, or unhealing ulcers.  Psych: Denies depression, anxiety, memory loss, suicidal ideation, hallucinations, paranoia, and confusion. Heme: Denies bruising, bleeding, and enlarged lymph nodes.  Physical Exam: BP 131/80  Pulse 91  Temp(Src) 98.2 F (36.8 C) (Temporal)  Ht 5\' 8"  (1.727 m)  Wt 132 lb 12.8 oz (60.238 kg)  BMI 20.19 kg/m2 General:   Alert,  Well-developed, well-nourished, pleasant and cooperative in NAD Head:  Normocephalic and atraumatic. Eyes:  Sclera clear, no icterus.   Conjunctiva pink. Mouth:  No deformity or lesions, oropharynx pink and moist. Neck:  Supple; no masses or thyromegaly. Heart:  Regular rate and rhythm; no murmurs, clicks, rubs,  or gallops. Abdomen:  Well-healed midline surgical scars. Soft, nontender and nondistended. No masses, hepatosplenomegaly or hernias noted. Normal bowel sounds, without guarding, and without rebound.   Msk:  Symmetrical without gross deformities. Normal posture. Pulses:  Normal pulses noted. Extremities:  Without clubbing or edema. Neurologic:  Alert and  oriented x4;  grossly normal neurologically. Skin:  Intact without significant lesions or rashes. Cervical Nodes:  No significant cervical adenopathy. Psych:  Alert and cooperative. Normal mood and affect.

## 2011-02-17 NOTE — Progress Notes (Signed)
Cc to PCP 

## 2011-02-22 ENCOUNTER — Other Ambulatory Visit: Payer: Self-pay | Admitting: Gastroenterology

## 2011-02-22 DIAGNOSIS — Z85038 Personal history of other malignant neoplasm of large intestine: Secondary | ICD-10-CM

## 2011-02-22 NOTE — Progress Notes (Signed)
Pt is scheduled for 03/18/11- instructions mailed

## 2011-02-22 NOTE — Progress Notes (Signed)
Dr. Jena Gauss agrees patient should have colonoscopy now. Reason history of colon cancer. Please arrange.

## 2011-03-03 ENCOUNTER — Other Ambulatory Visit: Payer: Self-pay

## 2011-03-03 ENCOUNTER — Emergency Department (HOSPITAL_COMMUNITY): Payer: Federal, State, Local not specified - PPO

## 2011-03-03 ENCOUNTER — Emergency Department (HOSPITAL_COMMUNITY)
Admission: EM | Admit: 2011-03-03 | Discharge: 2011-03-03 | Disposition: A | Discharge status: Home / Self Care | Payer: Federal, State, Local not specified - PPO | Attending: Emergency Medicine | Admitting: Emergency Medicine

## 2011-03-03 ENCOUNTER — Encounter (HOSPITAL_COMMUNITY): Payer: Self-pay | Admitting: *Deleted

## 2011-03-03 ENCOUNTER — Encounter (HOSPITAL_COMMUNITY): Payer: Federal, State, Local not specified - PPO | Attending: Oncology

## 2011-03-03 DIAGNOSIS — R52 Pain, unspecified: Secondary | ICD-10-CM | POA: Insufficient documentation

## 2011-03-03 DIAGNOSIS — I10 Essential (primary) hypertension: Secondary | ICD-10-CM | POA: Insufficient documentation

## 2011-03-03 DIAGNOSIS — C189 Malignant neoplasm of colon, unspecified: Secondary | ICD-10-CM

## 2011-03-03 DIAGNOSIS — K922 Gastrointestinal hemorrhage, unspecified: Secondary | ICD-10-CM | POA: Insufficient documentation

## 2011-03-03 DIAGNOSIS — M810 Age-related osteoporosis without current pathological fracture: Secondary | ICD-10-CM | POA: Insufficient documentation

## 2011-03-03 DIAGNOSIS — R0601 Orthopnea: Secondary | ICD-10-CM | POA: Insufficient documentation

## 2011-03-03 DIAGNOSIS — K219 Gastro-esophageal reflux disease without esophagitis: Secondary | ICD-10-CM | POA: Insufficient documentation

## 2011-03-03 DIAGNOSIS — G4730 Sleep apnea, unspecified: Secondary | ICD-10-CM | POA: Insufficient documentation

## 2011-03-03 DIAGNOSIS — M7989 Other specified soft tissue disorders: Secondary | ICD-10-CM | POA: Insufficient documentation

## 2011-03-03 DIAGNOSIS — Z452 Encounter for adjustment and management of vascular access device: Secondary | ICD-10-CM

## 2011-03-03 DIAGNOSIS — R600 Localized edema: Secondary | ICD-10-CM

## 2011-03-03 DIAGNOSIS — R609 Edema, unspecified: Secondary | ICD-10-CM | POA: Insufficient documentation

## 2011-03-03 DIAGNOSIS — Z8673 Personal history of transient ischemic attack (TIA), and cerebral infarction without residual deficits: Secondary | ICD-10-CM | POA: Insufficient documentation

## 2011-03-03 LAB — POCT I-STAT, CHEM 8
BUN: 4 mg/dL — ABNORMAL LOW (ref 6–23)
Calcium, Ion: 1.18 mmol/L (ref 1.12–1.32)
Chloride: 102 mEq/L (ref 96–112)
Glucose, Bld: 83 mg/dL (ref 70–99)
Potassium: 4.2 mEq/L (ref 3.5–5.1)

## 2011-03-03 LAB — TROPONIN I: Troponin I: <0.3 ng/mL (ref <0.30)

## 2011-03-03 MED ORDER — HEPARIN SOD (PORK) LOCK FLUSH 100 UNIT/ML IV SOLN
INTRAVENOUS | Status: AC
  Administered 2011-03-03: 300 [IU]
  Filled 2011-03-03: qty 5

## 2011-03-03 MED ORDER — HEPARIN SOD (PORK) LOCK FLUSH 100 UNIT/ML IV SOLN
500.0000 [IU] | Freq: Once | INTRAVENOUS | Status: AC
  Administered 2011-03-03: 500 [IU] via INTRAVENOUS
  Filled 2011-03-03: qty 5

## 2011-03-03 MED ORDER — HEPARIN SOD (PORK) LOCK FLUSH 100 UNIT/ML IV SOLN
INTRAVENOUS | Status: AC
  Administered 2011-03-03: 500 [IU] via INTRAVENOUS
  Filled 2011-03-03: qty 5

## 2011-03-03 MED ORDER — SODIUM CHLORIDE 0.9 % IJ SOLN
10.0000 mL | INTRAMUSCULAR | Status: DC | PRN
  Administered 2011-03-03: 10 mL via INTRAVENOUS
  Filled 2011-03-03: qty 10

## 2011-03-03 NOTE — Progress Notes (Signed)
[name] presented for Portacath access and flush. Proper placement of portacath confirmed by CXR. Portacath located left chest wall accessed with  H 20 needle. Good blood return present. Portacath flushed with 20ml NS and 500U/5ml Heparin and needle removed intact. Procedure without incident. Patient tolerated procedure well.   

## 2011-03-03 NOTE — ED Provider Notes (Addendum)
History  This chart was scribed for Felisa Bonier, MD by [name]. This patient was seen in room APA09/APA09 and the patient's care was started at 5:50PM.  CSN: [id number] Arrival date & time: 03/03/2011  5:21 PM   First MD Initiated Contact with Patient 03/03/11 1729      Chief Complaint  Patient presents with  . Leg Swelling     The history is provided by the patient. No language interpreter was used.    [name] is a 60 y.o. male who presents to the Emergency Department complaining of 2 weeks of gradual onset, non-changing, constant swelling to bilateral feet and ankles.  Pt states he was seen by his pcp and told to elevate his legs. Pt states he has complied and that the elevation has not improved the swelling. Pt reports no previous episodes of swelling. Pt also c/o occasional orthopnea. 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    Pt's PCP is Dr. Ignacia Palma in [place].   Past Medical History  Diagnosis Date  . 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 Pulmonary embolism 02/2010  . Acid reflux   . Hypertension   . Osteoporosis   . Arthritis   . TIA (transient ischemic attack) 10/11  . ETOH abuse     quit 03/2010  . 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 Personal history of PE (pulmonary embolism) 10/01/2010  . Blood transfusion   . S/P endoscopy September 28, 2010    erosive reflux esophagitis, Billroth I anatomy  . Angina   . Shortness of breath   . Sleep apnea   . Recurrent upper respiratory infection (URI)   . Anxiety   . Pneumonia   . Anemia     Past Surgical History  Procedure Date  . Hernia repair     right inguinal  . Appendectomy 1980s  . Cholecystectomy 1980s  . Colon surgery May 2012    left  . Portacath placement   . Abdominal sugery     for bowel obstruction x 8  . Esophagogastroduodenoscopy 09/28/2010  . Esophagogastroduodenoscopy 12/01/2010    Cervical web status post  dilation, erosive esophagitis, B1 hemigastrectomy, inflamed anastomosis    Family History  Problem Relation Age of Onset  . Hypertension Mother   . Arthritis Mother   . Hypertension Father   . Colon cancer Neg Hx     History  Substance Use Topics  . Smoking status: Current Everyday Smoker -- 0.5 packs/day for 40 years    Types: Cigarettes  . Smokeless tobacco: Not on file  . Alcohol Use: No     hx heavy use x 12      Review of Systems  Constitutional: Negative for fever and appetite change.  HENT: Negative for congestion, sore throat and neck stiffness.   Eyes: Negative for pain and visual disturbance.  Respiratory: Positive for shortness of breath (Occassional opnea).   Cardiovascular: Positive for leg swelling (bilaterally).  Gastrointestinal: Negative for nausea, vomiting, abdominal pain and diarrhea.  Genitourinary: Negative for dysuria and hematuria.  Musculoskeletal: Positive for joint swelling (bilaterally in both ankles).  Skin: Negative for rash and wound.  Neurological: Negative for weakness, numbness and headaches.  Hematological: Bruises/bleeds easily.  Psychiatric/Behavioral: Negative for behavioral problems and self-injury.    Allergies  Review of patient's allergies indicates no known allergies.  Home Medications   Current Outpatient Rx  Name Route Sig Dispense Refill  .  CYANOCOBALAMIN IJ Injection Inject 1,000 mcg as directed every 30 (thirty) days.      . CYCLOBENZAPRINE HCL 10 MG PO TABS Oral Take 10 mg by mouth 3 (three) times daily as needed. For muscle spasms    . DEXLANSOPRAZOLE 60 MG PO CPDR Oral Take 60 mg by mouth daily.      [name] ENOXAPARIN SODIUM 60 MG/0.6ML [place] SOLN Subcutaneous Inject 0.55 mLs (55 mg total) into the skin daily. 16.5 mL 0  . ENSURE CLINICAL ST REVIGOR PO LIQD Oral Take 237 mLs by mouth 2 (two) times daily between meals.    . FENTANYL 25 MCG/HR TD PT72 Transdermal Place 1 patch (25 mcg total) onto the skin every 3 (three) days.  10 patch 0  . FOLIC ACID 1 MG PO TABS Oral Take 1 mg by mouth daily.      [name] HYDROCODONE-ACETAMINOPHEN 5-325 MG PO TABS Oral Take 1 tablet by mouth every 6 (six) hours as needed. For pain 45 tablet 0  . LIDOCAINE-PRILOCAINE 2.5-2.5 % EX CREA Topical Apply 1 application topically as needed. For port access    . LORAZEPAM 1 MG PO TABS Oral Take 1 mg by mouth every 4 (four) hours as needed. Every 3-4 hours prn n/v    . OLMESARTAN MEDOXOMIL 5 MG PO TABS Oral Take 5 mg by mouth daily.      [name] ONDANSETRON HCL 4 MG PO TABS Oral Take 4 mg by mouth every 12 (twelve) hours as needed. For nausea    . PROCHLORPERAZINE MALEATE 10 MG PO TABS Oral Take 10 mg by mouth every 6 (six) hours as needed.      [name] PROCHLORPERAZINE 25 MG RE SUPP Rectal Place 25 mg rectally every 12 (twelve) hours as needed. For nausea    . SERTRALINE HCL 50 MG PO TABS Oral Take 50 mg by mouth at bedtime.     . THIAMINE HCL 100 MG PO TABS Oral Take 100 mg by mouth daily.        Triage Vitals: BP 141/98  Pulse 88  Temp(Src) 98.1 F (36.7 C) (Oral)  Resp 18  Ht 5\' 8"  (1.727 m)  Wt 130 lb (58.968 kg)  BMI 19.77 kg/m2  SpO2 100%  Physical Exam  Nursing note and vitals reviewed. Constitutional: He is oriented to person, place, and time. He appears well-developed and well-nourished.  HENT:  Head: Normocephalic and atraumatic.  Neck: Normal range of motion. Neck supple. No JVD present.  Cardiovascular: Normal rate, regular rhythm and normal heart sounds.  Exam reveals no gallop and no friction rub.   No murmur heard. Pulmonary/Chest: Effort normal and breath sounds normal. He has no wheezes. He has no rales.       Clears in all fields  Abdominal: Soft. Bowel sounds are normal.  Musculoskeletal: Normal range of motion. He exhibits edema (Trace edema at the ankles and dorsum of the feet without sign of cellulitis, no tenderness to the calves, normal dorsalis pedis pulses). He exhibits no tenderness.  Neurological: He is alert and  oriented to person, place, and time.  Skin: Skin is warm and dry.  Psychiatric: He has a normal mood and affect. His behavior is normal.    ED Course  Procedures (including critical care time)  DIAGNOSTIC STUDIES: Oxygen Saturation is 100% on room air, normal by my interpretation.    COORDINATION OF CARE: 5:57PM-Discussed CHF test and treatment plan with pt at bedside and pt agreed to plan.   8:03 PM x-rays and  laboratory results were reviewed and the patient has normal kidney function, no pulmonary vascular congestion or pulmonary edema and chest x-ray and a very subtle elevated pro B. natruretic peptide which I do not feel is suggestive of overt CHF is a cause his lower extremity edema. I will discharge the patient to followup with his primary care doctor as needed.   Date: 03/03/2011  Rate: 66  Rhythm: normal sinus rhythm  QRS Axis: normal  Intervals: normal  ST/T Wave abnormalities: nonspecific T wave changes  Conduction Disutrbances:none  Narrative Interpretation: Borderline criteria for left ventricular hypertrophy, otherwise non-provocative EKG  Old EKG Reviewed: unchanged    Labs Reviewed - No data to display No results found.   No diagnosis found.    MDM  Renal insuffiencey, CHF, but DVT is not thought likely as the pt is anticoagulated with Lovenox and has no tenderness or swelling to the calves. No apparent sign of overt cellulitis and neuropathy is not thought likely as the pt is not diabetic. I discussed with him the evaluation with CHF is non-emergent and can be performed by me or his PCP. Pt elected for me to do the evaluation.    I personally performed the services described in this documentation, which was scribed in my presence. The recorded information has been reviewed and considered.    Felisa Bonier, MD 03/03/11 1857  Felisa Bonier, MD 03/03/11 2004

## 2011-03-03 NOTE — ED Notes (Signed)
Pt c/o swelling to feet ankles and legs bilat. Pt states he was seen by his pcp and told to elevate legs. Pt states he has complied and has not improved.

## 2011-03-05 LAB — POCT I-STAT TROPONIN I: Troponin i, poc: 0 ng/mL (ref 0.00–0.08)

## 2011-03-09 ENCOUNTER — Encounter (HOSPITAL_COMMUNITY): Payer: Self-pay | Admitting: Pharmacy Technician

## 2011-03-13 ENCOUNTER — Other Ambulatory Visit (HOSPITAL_COMMUNITY): Payer: Self-pay | Admitting: Oncology

## 2011-03-18 ENCOUNTER — Encounter (HOSPITAL_COMMUNITY): Payer: Self-pay | Admitting: *Deleted

## 2011-03-18 ENCOUNTER — Encounter (HOSPITAL_COMMUNITY)
Admission: RE | Disposition: A | Discharge status: Home / Self Care | Payer: Self-pay | Source: Ambulatory Visit | Attending: Internal Medicine

## 2011-03-18 ENCOUNTER — Other Ambulatory Visit: Payer: Self-pay | Admitting: Internal Medicine

## 2011-03-18 ENCOUNTER — Ambulatory Visit (HOSPITAL_COMMUNITY)
Admission: RE | Admit: 2011-03-18 | Discharge: 2011-03-18 | Disposition: A | Discharge status: Home / Self Care | Payer: Federal, State, Local not specified - PPO | Source: Ambulatory Visit | Attending: Internal Medicine | Admitting: Internal Medicine

## 2011-03-18 DIAGNOSIS — K5730 Diverticulosis of large intestine without perforation or abscess without bleeding: Secondary | ICD-10-CM

## 2011-03-18 DIAGNOSIS — G4733 Obstructive sleep apnea (adult) (pediatric): Secondary | ICD-10-CM | POA: Insufficient documentation

## 2011-03-18 DIAGNOSIS — D126 Benign neoplasm of colon, unspecified: Secondary | ICD-10-CM | POA: Insufficient documentation

## 2011-03-18 DIAGNOSIS — Z85038 Personal history of other malignant neoplasm of large intestine: Secondary | ICD-10-CM | POA: Insufficient documentation

## 2011-03-18 DIAGNOSIS — I10 Essential (primary) hypertension: Secondary | ICD-10-CM | POA: Insufficient documentation

## 2011-03-18 DIAGNOSIS — Z79899 Other long term (current) drug therapy: Secondary | ICD-10-CM | POA: Insufficient documentation

## 2011-03-18 DIAGNOSIS — Z9049 Acquired absence of other specified parts of digestive tract: Secondary | ICD-10-CM | POA: Insufficient documentation

## 2011-03-18 HISTORY — PX: COLONOSCOPY: SHX5424

## 2011-03-18 SURGERY — COLONOSCOPY
Anesthesia: Moderate Sedation

## 2011-03-18 MED ORDER — MEPERIDINE HCL 100 MG/ML IJ SOLN
INTRAMUSCULAR | Status: AC
  Filled 2011-03-18: qty 1

## 2011-03-18 MED ORDER — MEPERIDINE HCL 100 MG/ML IJ SOLN
INTRAMUSCULAR | Status: DC | PRN
  Administered 2011-03-18: 25 mg via INTRAVENOUS
  Administered 2011-03-18: 50 mg via INTRAVENOUS

## 2011-03-18 MED ORDER — STERILE WATER FOR IRRIGATION IR SOLN
Status: DC | PRN
  Administered 2011-03-18: 09:00:00

## 2011-03-18 MED ORDER — SODIUM CHLORIDE 0.45 % IV SOLN
Freq: Once | INTRAVENOUS | Status: AC
  Administered 2011-03-18: 09:00:00 via INTRAVENOUS

## 2011-03-18 MED ORDER — MIDAZOLAM HCL 5 MG/5ML IJ SOLN
INTRAMUSCULAR | Status: AC
  Filled 2011-03-18: qty 10

## 2011-03-18 MED ORDER — MIDAZOLAM HCL 5 MG/5ML IJ SOLN
INTRAMUSCULAR | Status: DC | PRN
  Administered 2011-03-18 (×2): 2 mg via INTRAVENOUS

## 2011-03-18 NOTE — Op Note (Signed)
[facility] [address] [place], [place]  [address]  COLONOSCOPY PROCEDURE REPORT  PATIENT:  [name], [name]  MR#:  [id number] BIRTHDATE:  02-07-1951, 60 yrs. old  GENDER:  male ENDOSCOPIST:  R. Roetta Sessions, MD FACP [facility] REF. BY:          Dr. Glenford Peers PROCEDURE DATE:  03/18/2011 PROCEDURE:  colonoscopy with biopsy and multiple snare polypectomies  INDICATIONS:  history of left hemicolectomy earlier this year. No recent colonoscopy. Last colonoscopy  reportedly negative in [place] 5 years ago (done for screening patient report)  INFORMED CONSENT:  The risks, benefits, alternatives and imponderables including but not limited to bleeding, perforation as well as the possibility of a missed lesion have been reviewed. The potential for biopsy, lesion removal, etc. have also been discussed.  Questions have been answered.  All parties agreeable. Please see the history and physical in the medical record for more information.  MEDICATIONS:  Versed 4 mg IV a 75 mg IV in divided doses  DESCRIPTION OF PROCEDURE:  After a digital rectal exam was performed, the EC-[id number] ([id number]) colonoscope was advanced from the anus through the rectum and colon to the area of the cecum, ileocecal valve and appendiceal orifice.  The cecum was deeply intubated.  These structures were well-seen and photographed for the record.  From the level of the cecum and ileocecal valve, the scope was slowly and cautiously withdrawn.  The mucosal surfaces were carefully surveyed utilizing scope tip deflection to facilitate fold flattening as needed.  The scope was pulled down into the rectum where a thorough examination including retroflexion was performed. <<PROCEDUREIMAGES>>  FINDINGS:   Surgical anastomosis at 35 cm from the anal verge. 1-1.25 cm sessile polyp at the anastomosis. A single diminutive polyp in the base           of the cecum. One more 6 mm polyp in the mid sigmoid  at 25 cm. The patient has sigmoid diverticula. The remainder of the residual colonic mucosa appeared normal.  THERAPEUTIC / DIAGNOSTIC MANEUVERS PERFORMED:  all polyps removed with a hot snare or cold biopsy technique  COMPLICATIONS:  none  CECAL WITHDRAWAL TIME:12 minutes  IMPRESSION:   Multiple colonic polyps-removed as described above. Evidence of prior left hemicolectomy as described above. Sigmoid diverticulosis.  RECOMMENDATIONS:   Followup on pathology copies also to Dr. Ronney Asters  ______________________________ R. Roetta Sessions, MD Caleen Essex  CC:  Glenford Peers, MD  n. eSIGNED:   R. Roetta Sessions at 03/18/2011 09:56 AM  Sherrlyn Hock, [phone]

## 2011-03-18 NOTE — H&P (Signed)
  I have seen & examined the patient prior to the procedure(s) today and reviewed the history and physical/consultation.  There have been no changes.  After consideration of the risks, benefits, alternatives and imponderables, the patient has consented to the procedure(s).   

## 2011-03-22 ENCOUNTER — Telehealth: Payer: Self-pay | Admitting: Internal Medicine

## 2011-03-22 DIAGNOSIS — K922 Gastrointestinal hemorrhage, unspecified: Secondary | ICD-10-CM

## 2011-03-22 DIAGNOSIS — C189 Malignant neoplasm of colon, unspecified: Secondary | ICD-10-CM

## 2011-03-22 MED ORDER — DEXLANSOPRAZOLE 60 MG PO CPDR: 60.0000 mg | DELAYED_RELEASE_CAPSULE | Freq: Every day | ORAL | Status: DC

## 2011-03-22 NOTE — Telephone Encounter (Signed)
Pt called to ask if we would call in prescription to [facility] in [place] [phone]). He had Dexilant samples and now needs a Rx called in.

## 2011-03-22 NOTE — Telephone Encounter (Signed)
Addended by: Tiffany Kocher on: 03/22/2011 02:44 PM   Modules accepted: Orders

## 2011-03-22 NOTE — Telephone Encounter (Signed)
Routed to refill box 

## 2011-03-25 ENCOUNTER — Other Ambulatory Visit (HOSPITAL_COMMUNITY): Payer: Self-pay | Admitting: Oncology

## 2011-03-25 DIAGNOSIS — I2699 Other pulmonary embolism without acute cor pulmonale: Secondary | ICD-10-CM

## 2011-03-25 MED ORDER — ENOXAPARIN SODIUM 60 MG/0.6ML ~~LOC~~ SOLN: 1.0000 mg/kg | Freq: Every day | SUBCUTANEOUS | Status: DC

## 2011-03-29 ENCOUNTER — Encounter (HOSPITAL_COMMUNITY): Payer: Self-pay | Admitting: Internal Medicine

## 2011-04-14 ENCOUNTER — Encounter (HOSPITAL_COMMUNITY): Payer: Self-pay

## 2011-04-14 ENCOUNTER — Inpatient Hospital Stay (HOSPITAL_COMMUNITY)
Admission: EM | Admit: 2011-04-14 | Discharge: 2011-04-17 | DRG: 175 | Disposition: A | Discharge status: Home / Self Care | Payer: Federal, State, Local not specified - PPO | Attending: Internal Medicine | Admitting: Internal Medicine

## 2011-04-14 DIAGNOSIS — R609 Edema, unspecified: Secondary | ICD-10-CM | POA: Diagnosis present

## 2011-04-14 DIAGNOSIS — Z79899 Other long term (current) drug therapy: Secondary | ICD-10-CM

## 2011-04-14 DIAGNOSIS — I10 Essential (primary) hypertension: Secondary | ICD-10-CM | POA: Diagnosis present

## 2011-04-14 DIAGNOSIS — G8929 Other chronic pain: Secondary | ICD-10-CM | POA: Diagnosis present

## 2011-04-14 DIAGNOSIS — M810 Age-related osteoporosis without current pathological fracture: Secondary | ICD-10-CM | POA: Diagnosis present

## 2011-04-14 DIAGNOSIS — M129 Arthropathy, unspecified: Secondary | ICD-10-CM | POA: Diagnosis present

## 2011-04-14 DIAGNOSIS — Z8673 Personal history of transient ischemic attack (TIA), and cerebral infarction without residual deficits: Secondary | ICD-10-CM

## 2011-04-14 DIAGNOSIS — K449 Diaphragmatic hernia without obstruction or gangrene: Secondary | ICD-10-CM | POA: Diagnosis present

## 2011-04-14 DIAGNOSIS — Z86711 Personal history of pulmonary embolism: Secondary | ICD-10-CM

## 2011-04-14 DIAGNOSIS — T45515A Adverse effect of anticoagulants, initial encounter: Secondary | ICD-10-CM | POA: Diagnosis present

## 2011-04-14 DIAGNOSIS — R112 Nausea with vomiting, unspecified: Secondary | ICD-10-CM

## 2011-04-14 DIAGNOSIS — R933 Abnormal findings on diagnostic imaging of other parts of digestive tract: Secondary | ICD-10-CM

## 2011-04-14 DIAGNOSIS — K920 Hematemesis: Secondary | ICD-10-CM

## 2011-04-14 DIAGNOSIS — K5289 Other specified noninfective gastroenteritis and colitis: Secondary | ICD-10-CM | POA: Diagnosis present

## 2011-04-14 DIAGNOSIS — R109 Unspecified abdominal pain: Secondary | ICD-10-CM | POA: Diagnosis present

## 2011-04-14 DIAGNOSIS — R791 Abnormal coagulation profile: Secondary | ICD-10-CM | POA: Diagnosis present

## 2011-04-14 DIAGNOSIS — I2699 Other pulmonary embolism without acute cor pulmonale: Secondary | ICD-10-CM

## 2011-04-14 DIAGNOSIS — Z85038 Personal history of other malignant neoplasm of large intestine: Secondary | ICD-10-CM

## 2011-04-14 DIAGNOSIS — K219 Gastro-esophageal reflux disease without esophagitis: Secondary | ICD-10-CM | POA: Diagnosis present

## 2011-04-14 DIAGNOSIS — C189 Malignant neoplasm of colon, unspecified: Secondary | ICD-10-CM

## 2011-04-14 DIAGNOSIS — K922 Gastrointestinal hemorrhage, unspecified: Secondary | ICD-10-CM

## 2011-04-14 DIAGNOSIS — F411 Generalized anxiety disorder: Secondary | ICD-10-CM | POA: Diagnosis present

## 2011-04-14 DIAGNOSIS — Z7901 Long term (current) use of anticoagulants: Secondary | ICD-10-CM

## 2011-04-14 DIAGNOSIS — K226 Gastro-esophageal laceration-hemorrhage syndrome: Principal | ICD-10-CM | POA: Diagnosis present

## 2011-04-14 DIAGNOSIS — F17200 Nicotine dependence, unspecified, uncomplicated: Secondary | ICD-10-CM | POA: Diagnosis present

## 2011-04-14 LAB — HEPATIC FUNCTION PANEL
Albumin: 3.2 g/dL — ABNORMAL LOW (ref 3.5–5.2)
Total Protein: 5.9 g/dL — ABNORMAL LOW (ref 6.0–8.3)

## 2011-04-14 LAB — BASIC METABOLIC PANEL
BUN: 9 mg/dL (ref 6–23)
CO2: 27 mEq/L (ref 19–32)
Chloride: 100 mEq/L (ref 96–112)
GFR calc non Af Amer: >90 mL/min (ref >90)
Glucose, Bld: 85 mg/dL (ref 70–99)
Potassium: 4 mEq/L (ref 3.5–5.1)
Sodium: 137 mEq/L (ref 135–145)

## 2011-04-14 LAB — URINALYSIS, ROUTINE W REFLEX MICROSCOPIC
Glucose, UA: NEGATIVE mg/dL
Hgb urine dipstick: NEGATIVE
Ketones, ur: NEGATIVE mg/dL
Protein, ur: NEGATIVE mg/dL
Urobilinogen, UA: 0.2 mg/dL (ref 0.0–1.0)

## 2011-04-14 LAB — CBC
Hemoglobin: 12 g/dL — ABNORMAL LOW (ref 13.0–17.0)
MCH: 31.4 pg (ref 26.0–34.0)
Platelets: 255 10*3/uL (ref 150–400)
RBC: 3.82 MIL/uL — ABNORMAL LOW (ref 4.22–5.81)
WBC: 12.2 10*3/uL — ABNORMAL HIGH (ref 4.0–10.5)

## 2011-04-14 LAB — DIFFERENTIAL
Eosinophils Absolute: 0.1 10*3/uL (ref 0.0–0.7)
Lymphocytes Relative: 18 % (ref 12–46)
Lymphs Abs: 2.2 10*3/uL (ref 0.7–4.0)
Monocytes Relative: 8 % (ref 3–12)
Neutrophils Relative %: 73 % (ref 43–77)

## 2011-04-14 LAB — PROTIME-INR
INR: 4 — ABNORMAL HIGH (ref 0.00–1.49)
INR: 4.25 — ABNORMAL HIGH (ref 0.00–1.49)
Prothrombin Time: 39.6 seconds — ABNORMAL HIGH (ref 11.6–15.2)
Prothrombin Time: 41.5 seconds — ABNORMAL HIGH (ref 11.6–15.2)

## 2011-04-14 LAB — APTT: aPTT: 54 seconds — ABNORMAL HIGH (ref 24–37)

## 2011-04-14 MED ORDER — HYDROCHLOROTHIAZIDE 25 MG PO TABS
12.5000 mg | ORAL_TABLET | Freq: Every day | ORAL | Status: DC
  Administered 2011-04-14 – 2011-04-16 (×3): 12.5 mg via ORAL
  Filled 2011-04-14 (×4): qty 1

## 2011-04-14 MED ORDER — PANTOPRAZOLE SODIUM 40 MG IV SOLR
40.0000 mg | Freq: Two times a day (BID) | INTRAVENOUS | Status: DC
  Administered 2011-04-14 – 2011-04-17 (×6): 40 mg via INTRAVENOUS
  Filled 2011-04-14 (×6): qty 40

## 2011-04-14 MED ORDER — OLMESARTAN MEDOXOMIL 20 MG PO TABS
10.0000 mg | ORAL_TABLET | Freq: Every day | ORAL | Status: DC
  Administered 2011-04-14 – 2011-04-16 (×3): 10 mg via ORAL
  Filled 2011-04-14 (×3): qty 1

## 2011-04-14 MED ORDER — VALSARTAN-HYDROCHLOROTHIAZIDE 80-12.5 MG PO TABS: 1.0000 | ORAL_TABLET | Freq: Every day | ORAL | Status: DC

## 2011-04-14 MED ORDER — FOLIC ACID 1 MG PO TABS
1.0000 mg | ORAL_TABLET | Freq: Every day | ORAL | Status: DC
  Administered 2011-04-14 – 2011-04-17 (×4): 1 mg via ORAL
  Filled 2011-04-14 (×4): qty 1

## 2011-04-14 MED ORDER — ACETAMINOPHEN 650 MG RE SUPP: 650.0000 mg | Freq: Four times a day (QID) | RECTAL | Status: DC | PRN

## 2011-04-14 MED ORDER — PHYTONADIONE 5 MG PO TABS
10.0000 mg | ORAL_TABLET | ORAL | Status: DC
  Administered 2011-04-14 – 2011-04-15 (×2): 10 mg via ORAL
  Filled 2011-04-14 (×5): qty 2

## 2011-04-14 MED ORDER — MORPHINE SULFATE 4 MG/ML IJ SOLN
4.0000 mg | Freq: Once | INTRAMUSCULAR | Status: AC
  Administered 2011-04-14: 4 mg via INTRAVENOUS
  Filled 2011-04-14: qty 1

## 2011-04-14 MED ORDER — ACETAMINOPHEN 325 MG PO TABS: 650.0000 mg | ORAL_TABLET | Freq: Four times a day (QID) | ORAL | Status: DC | PRN

## 2011-04-14 MED ORDER — ONDANSETRON HCL 4 MG/2ML IJ SOLN
4.0000 mg | Freq: Four times a day (QID) | INTRAMUSCULAR | Status: DC | PRN
  Administered 2011-04-14 – 2011-04-17 (×8): 4 mg via INTRAVENOUS
  Filled 2011-04-14 (×8): qty 2

## 2011-04-14 MED ORDER — ONDANSETRON HCL 4 MG PO TABS
4.0000 mg | ORAL_TABLET | Freq: Four times a day (QID) | ORAL | Status: DC | PRN
  Administered 2011-04-15 – 2011-04-16 (×2): 4 mg via ORAL
  Filled 2011-04-14 (×2): qty 1

## 2011-04-14 MED ORDER — ONDANSETRON HCL 4 MG/2ML IJ SOLN
4.0000 mg | Freq: Once | INTRAMUSCULAR | Status: AC
  Administered 2011-04-14: 4 mg via INTRAVENOUS
  Filled 2011-04-14: qty 2

## 2011-04-14 MED ORDER — MORPHINE SULFATE 2 MG/ML IJ SOLN
1.0000 mg | INTRAMUSCULAR | Status: DC | PRN
  Administered 2011-04-14 – 2011-04-17 (×15): 2 mg via INTRAVENOUS
  Filled 2011-04-14 (×15): qty 1

## 2011-04-14 MED ORDER — SERTRALINE HCL 50 MG PO TABS
50.0000 mg | ORAL_TABLET | Freq: Every day | ORAL | Status: DC
  Administered 2011-04-14 – 2011-04-16 (×3): 50 mg via ORAL
  Filled 2011-04-14 (×3): qty 1

## 2011-04-14 MED ORDER — ENSURE CLINICAL ST REVIGOR PO LIQD
237.0000 mL | Freq: Two times a day (BID) | ORAL | Status: DC
  Administered 2011-04-15 – 2011-04-17 (×4): 237 mL via ORAL

## 2011-04-14 MED ORDER — TRAMADOL HCL 50 MG PO TABS
50.0000 mg | ORAL_TABLET | Freq: Four times a day (QID) | ORAL | Status: DC | PRN
  Administered 2011-04-15: 50 mg via ORAL
  Filled 2011-04-14: qty 1

## 2011-04-14 MED ORDER — LORAZEPAM 1 MG PO TABS: 1.0000 mg | ORAL_TABLET | ORAL | Status: DC | PRN

## 2011-04-14 MED ORDER — SODIUM CHLORIDE 0.9 % IV SOLN
INTRAVENOUS | Status: DC
  Administered 2011-04-14: 13:00:00 via INTRAVENOUS

## 2011-04-14 MED ORDER — SODIUM CHLORIDE 0.9 % IV BOLUS (SEPSIS)
500.0000 mL | Freq: Once | INTRAVENOUS | Status: AC
  Administered 2011-04-14: 500 mL via INTRAVENOUS

## 2011-04-14 MED ORDER — VITAMIN B-1 100 MG PO TABS
100.0000 mg | ORAL_TABLET | Freq: Every day | ORAL | Status: DC
  Administered 2011-04-14 – 2011-04-17 (×4): 100 mg via ORAL
  Filled 2011-04-14 (×4): qty 1

## 2011-04-14 NOTE — ED Notes (Signed)
Port accessed using  Sterile technique.

## 2011-04-14 NOTE — ED Notes (Signed)
Pt reports since 5am has been vomiting bright red blood.  C/O feeling lightheaded, cold chills, and abd pain.  Pt says has had 2 episodes of vomiting.  THe first episode noticed a lot of blood in commode, 2nd episode, blood was more streaked in emesis.  LBM was Monday and denies it being black of bloody.

## 2011-04-14 NOTE — ED Provider Notes (Cosign Needed Addendum)
This chart was scribed for [name] Melter, MD by [name]. The patient was seen in room APA05/APA05 at 12:33 PM. .  CSN: [id number]  Arrival date & time 04/14/11  1016   First MD Initiated Contact with Patient 04/14/11 1230      Chief Complaint  Patient presents with  . Nausea  . Hematemesis  . Abdominal Pain  . Dizziness    (Consider location/radiation/quality/duration/timing/severity/associated sxs/prior treatment) HPI [name] is a 61 y.o. male with a history of colon cancer, pulmonary embolism, and hypertension who presents to the Emergency Department complaining of hematemesis. Pt stated that he had vomited blood twice since 5 am this morning. Pt has associated nausea. His wife suspects that he is depressed. Pt does not have a history of diabetes or heart disease. He takes Lovenox for the pulmonary embolism. PCP-Dr. Ignacia Palma in [place]   Past Medical History  Diagnosis Date  . 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 Pulmonary embolism 02/2010  . Acid reflux   . Hypertension   . Osteoporosis   . Arthritis   . TIA (transient ischemic attack) 10/11  . ETOH abuse     quit 03/2010  . 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 Personal history of PE (pulmonary embolism) 10/01/2010  . Blood transfusion   . S/P endoscopy September 28, 2010    erosive reflux esophagitis, Billroth I anatomy  . Angina   . Shortness of breath   . Sleep apnea   . Recurrent upper respiratory infection (URI)   . Anxiety   . Pneumonia   . Anemia     Past Surgical History  Procedure Date  . Hernia repair     right inguinal  . Appendectomy 1980s  . Cholecystectomy 1980s  . Colon surgery May 2012    left  . Portacath placement   . Abdominal sugery     for bowel obstruction x 8  . Esophagogastroduodenoscopy 09/28/2010  . Esophagogastroduodenoscopy 12/01/2010    Cervical web status post dilation, erosive esophagitis, B1 hemigastrectomy, inflamed anastomosis  .  Colonoscopy 03/18/2011    Procedure: COLONOSCOPY;  Surgeon: Corbin Ade, MD;  Location: [facility];  Service: Endoscopy;  Laterality: N/A;  9:15    Family History  Problem Relation Age of Onset  . Hypertension Mother   . Arthritis Mother   . Hypertension Father   . Colon cancer Neg Hx     History  Substance Use Topics  . Smoking status: Current Everyday Smoker -- 0.5 packs/day for 40 years    Types: Cigarettes  . Smokeless tobacco: Not on file  . Alcohol Use: No     last drink was Apr 16 2010      Review of Systems 10 Systems reviewed and are negative for acute change except as noted in the HPI.  Allergies  Review of patient's allergies indicates no known allergies.  Home Medications   Current Outpatient Rx  Name Route Sig Dispense Refill  . CARAFATE 1 GM/10ML PO SUSP Oral Take 15 mg by mouth 4 times daily.    . CYANOCOBALAMIN 1000 MCG/ML IJ SOLN Intramuscular Inject 1,000 mcg into the muscle every 30 (thirty) days.    . DEXLANSOPRAZOLE 60 MG PO CPDR Oral Take 1 capsule (60 mg total) by mouth daily. 30 capsule 11  . ENOXAPARIN SODIUM 60 MG/0.6ML [place] SOLN Subcutaneous Inject 0.6 mLs (60 mg total) into the skin at bedtime. 16.8 mL 4  30 pre-filled syringes  . ENSURE CLINICAL ST REVIGOR PO LIQD Oral Take 237 mLs by mouth 2 (two) times daily between meals.    [name] FOLIC ACID 1 MG PO TABS Oral Take 1 mg by mouth daily.      [name] LORAZEPAM 1 MG PO TABS Oral Take 1 mg by mouth every 4 (four) hours as needed. Nausea and Vomiting    . PROCHLORPERAZINE MALEATE 10 MG PO TABS  TAKE ONE TABLET BY MOUTH EVERY 6 HOURS AS NEEDED FOR NAUSEA 30 tablet 0  . SERTRALINE HCL 50 MG PO TABS Oral Take 50 mg by mouth at bedtime.     . THIAMINE HCL 100 MG PO TABS Oral Take 100 mg by mouth daily.      . TRAMADOL HCL 50 MG PO TABS Oral Take 50 mg by mouth 4 (four) times daily as needed. For pain    . VALSARTAN-HYDROCHLOROTHIAZIDE 80-12.5 MG PO TABS Oral Take 1 tablet by mouth daily.    Pulse  oximetry on room air is 100%. Normal by my interpretation.   BP 156/89  Pulse 80  Temp(Src) 98.5 F (36.9 C) (Oral)  Resp 16  Ht 5\' 9"  (1.753 m)  Wt 135 lb (61.236 kg)  BMI 19.94 kg/m2  SpO2 98%  Physical Exam  Nursing note and vitals reviewed. Constitutional: He is oriented to person, place, and time. He appears well-developed and well-nourished.  HENT:  Head: Normocephalic and atraumatic.  Eyes: Conjunctivae and EOM are normal.  Neck: Normal range of motion. Neck supple.  Abdominal: Soft. He exhibits no mass. There is tenderness (diffusely tender).  Musculoskeletal: He exhibits edema (pitting edema in legs).  Neurological: He is alert and oriented to person, place, and time.  Skin: Skin is warm and dry.  Psychiatric: His behavior is normal. Judgment and thought content normal.       Poor eye contact. Appears depressed    ED Course  Procedures (including critical care time)  Medications  valsartan-hydrochlorothiazide (DIOVAN-HCT) 80-12.5 MG per tablet (not administered)  cyanocobalamin (,VITAMIN B-12,) 1000 MCG/ML injection (not administered)  0.9 %  sodium chloride infusion (  Intravenous New Bag/Given 04/14/11 1321)  sodium chloride 0.9 % bolus 500 mL (500 mL Intravenous Given 04/14/11 1321)  morphine 4 MG/ML injection 4 mg (4 mg Intravenous Given 04/14/11 1400)  ondansetron (ZOFRAN) injection 4 mg (4 mg Intravenous Given 04/14/11 1400)    Labs Reviewed  CBC - Abnormal; Notable for the following:    WBC 12.2 (*)    RBC 3.82 (*)    Hemoglobin 12.0 (*)    HCT 35.9 (*)    All other components within normal limits  DIFFERENTIAL - Abnormal; Notable for the following:    Neutro Abs 8.9 (*)    All other components within normal limits  PROTIME-INR - Abnormal; Notable for the following:    Prothrombin Time 39.6 (*)    INR 4.00 (*)    All other components within normal limits  APTT - Abnormal; Notable for the following:    aPTT 54 (*)    All other components within  normal limits  BASIC METABOLIC PANEL  LIPASE, BLOOD  TYPE AND SCREEN  URINALYSIS, ROUTINE W REFLEX MICROSCOPIC  HEPATIC FUNCTION PANEL   No results found.   1. Hematemesis       MDM  Hematemesis with adequate coagulation. Using Lovenox, and elevated INR. Patient will need to be admitted for observation. Currently, he is the hemodynamically stable in the emergency department. He  may need endoscopy. However, at this time that is not indicated. He is not actively vomiting.  I personally performed the services described in this documentation, which was scribed in my presence. The recorded information has been reviewed and considered.         [name] Melter, MD 04/14/11 1414  [name] Melter, MD 04/14/11 1452

## 2011-04-14 NOTE — ED Notes (Signed)
Pt requesting pain medication and nausea medication. Will notify MD.

## 2011-04-14 NOTE — H&P (Signed)
PCP:   DAVIDSON,ERIC, MD, MD   Chief Complaint:  Hematemesis  HPI: This is a 61 year old gentleman with multiple medical problems who was in his usual state of health up until yesterday. This morning he reports waking up and going to the bathroom. He felt nauseous and felt like he needed to throw up. He reported having a large amount of bright red blood in his vomit. He had a second episode which had some streaking of blood. He reported to the emergency room for evaluation where he reports having another episode in the emergency room was a small amount of blood. Patient reports having a similar episode in the past. He's had upper endoscopy in the past which has revealed erosive esophagitis. Patient is also on anticoagulation. He was recently diagnosed with a pulmonary embolism at the end of October 2012. At that time 6 months of anticoagulation was recommended. He was continued on Lovenox for anticoagulation since he has had trouble with bleeding with Coumadin. He adamantly says that he has not been taking any Coumadin. I also called his pharmacy he reports the last time it was filled was in June. He is sure that his previous prescription of Coumadin has not mixed in with his current pills. In evaluation in the emergency room he was found to have an elevated INR of 4. Liver function tests were within normal limits. He does not appear toxic. He denies any fever but describes often having night sweats. He has a chronic cough which is productive of small amount of phlegm. He is still smoking. He has chronic abdominal pain which has been extensively evaluated in the past. He reports feeling dizzy on standing. Has some shortness of breath with exertion. The patient has been referred for admission.  Allergies:  No Known Allergies    Past Medical History  Diagnosis Date  . 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 Pulmonary embolism 02/2010  . Acid reflux   . Hypertension   .  Osteoporosis   . Arthritis   . TIA (transient ischemic attack) 10/11  . ETOH abuse     quit 03/2010  . 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 Personal history of PE (pulmonary embolism) 10/01/2010  . Blood transfusion   . S/P endoscopy September 28, 2010    erosive reflux esophagitis, [place]th I anatomy  . Angina   . Shortness of breath   . Sleep apnea   . Recurrent upper respiratory infection (URI)   . Anxiety   . Pneumonia   . Anemia     Past Surgical History  Procedure Date  . Hernia repair     right inguinal  . Appendectomy 1980s  . Cholecystectomy 1980s  . Colon surgery May 2012    left  . Portacath placement   . Abdominal sugery     for bowel obstruction x 8  . Esophagogastroduodenoscopy 09/28/2010  . Esophagogastroduodenoscopy 12/01/2010    Cervical web status post dilation, erosive esophagitis, B1 hemigastrectomy, inflamed anastomosis  . Colonoscopy 03/18/2011    Procedure: COLONOSCOPY;  Surgeon: Corbin Ade, MD;  Location: [facility];  Service: Endoscopy;  Laterality: N/A;  9:15    Prior to Admission medications   Medication Sig Start Date End Date Taking? Authorizing Provider  CARAFATE 1 GM/10ML suspension Take 15 mg by mouth 4 times daily. 02/22/11  Yes Historical Provider, MD  cyanocobalamin (,VITAMIN B-12,) 1000 MCG/ML injection Inject 1,000 mcg into the muscle every 30 (thirty) days.  Yes Historical Provider, MD  dexlansoprazole (DEXILANT) 60 MG capsule Take 1 capsule (60 mg total) by mouth daily. 03/22/11  Yes Tana Coast, PA  enoxaparin (LOVENOX) 60 MG/0.6ML SOLN Inject 0.6 mLs (60 mg total) into the skin at bedtime. 03/25/11  Yes Dellis Anes, PA  feeding supplement (ENSURE CLINICAL STRENGTH) LIQD Take 237 mLs by mouth 2 (two) times daily between meals. 10/01/10  Yes Elliot Cousin, MD  folic acid (FOLVITE) 1 MG tablet Take 1 mg by mouth daily.     Yes Historical Provider, MD  LORazepam (ATIVAN) 1 MG tablet Take 1 mg by mouth every 4 (four) hours as needed. Nausea  and Vomiting   Yes Historical Provider, MD  prochlorperazine (COMPAZINE) 10 MG tablet TAKE ONE TABLET BY MOUTH EVERY 6 HOURS AS NEEDED FOR NAUSEA 03/13/11  Yes Dellis Anes, PA  sertraline (ZOLOFT) 50 MG tablet Take 50 mg by mouth at bedtime.    Yes Historical Provider, MD  thiamine 100 MG tablet Take 100 mg by mouth daily.     Yes Historical Provider, MD  traMADol (ULTRAM) 50 MG tablet Take 50 mg by mouth 4 (four) times daily as needed. For pain 02/03/11  Yes Historical Provider, MD  valsartan-hydrochlorothiazide (DIOVAN-HCT) 80-12.5 MG per tablet Take 1 tablet by mouth daily.   Yes Historical Provider, MD    Social History:  reports that he has been smoking Cigarettes.  He has a 20 pack-year smoking history. He does not have any smokeless tobacco history on file. He reports that he does not drink alcohol or use illicit drugs.  Family History  Problem Relation Age of Onset  . Hypertension Mother   . Arthritis Mother   . Hypertension Father   . Colon cancer Neg Hx     Review of Systems: Positives in bold Constitutional: Denies fever, chills, diaphoresis, appetite change and fatigue.  HEENT: Denies photophobia, eye pain, redness, hearing loss, ear pain, congestion, sore throat, rhinorrhea, sneezing, mouth sores, trouble swallowing, neck pain, neck stiffness and tinnitus.   Respiratory: Denies SOB, DOE, cough, chest tightness,  and wheezing.   Cardiovascular: Denies chest pain, palpitations and leg swelling.  Gastrointestinal: Denies nausea, vomiting, abdominal pain, diarrhea, hematemesis, constipation, blood in stool and abdominal distention.  Genitourinary: Denies dysuria, urgency, frequency, hematuria, flank pain and difficulty urinating.  Musculoskeletal: Denies myalgias, back pain, joint swelling, arthralgias and gait problem.  Skin: Denies pallor, rash and wound.  Neurological: Denies dizziness, seizures, syncope, weakness, light-headedness, numbness and headaches.  Hematological:  Denies adenopathy. Easy bruising, personal or family bleeding history  Psychiatric/Behavioral: Denies suicidal ideation, mood changes, confusion, nervousness, sleep disturbance and agitation   Physical Exam: Blood pressure 156/89, pulse 80, temperature 98.5 F (36.9 C), temperature source Oral, resp. rate 16, height 5\' 9"  (1.753 m), weight 61.236 kg (135 lb), SpO2 98.00%. General: No acute distress, lying comfortably in bed HEENT: Normocephalic, atraumatic, pupils are equal and react to light Neck: Supple Chest: Clear to auscultation bilaterally Cardiac: S1, S2, regular rate and rhythm Abdomen: Soft, diffusely tender, bowel sounds are active, nondistended Extremities: No cyanosis, clubbing, or edema Neurologic: Grossly intact, nonfocal  Labs on Admission:  Results for orders placed during the hospital encounter of 04/14/11 (from the past 48 hour(s))  CBC     Status: Abnormal   Collection Time   04/14/11 12:10 PM      Component Value Range Comment   WBC 12.2 (*) 4.0 - 10.5 (K/uL)    RBC 3.82 (*) 4.22 - 5.81 (MIL/uL)  Hemoglobin 12.0 (*) 13.0 - 17.0 (g/dL)    HCT 16.1 (*) 09.6 - 52.0 (%)    MCV 94.0  78.0 - 100.0 (fL)    MCH 31.4  26.0 - 34.0 (pg)    MCHC 33.4  30.0 - 36.0 (g/dL)    RDW 04.5  40.9 - 81.1 (%)    Platelets 255  150 - 400 (K/uL)   DIFFERENTIAL     Status: Abnormal   Collection Time   04/14/11 12:10 PM      Component Value Range Comment   Neutrophils Relative 73  43 - 77 (%)    Neutro Abs 8.9 (*) 1.7 - 7.7 (K/uL)    Lymphocytes Relative 18  12 - 46 (%)    Lymphs Abs 2.2  0.7 - 4.0 (K/uL)    Monocytes Relative 8  3 - 12 (%)    Monocytes Absolute 1.0  0.1 - 1.0 (K/uL)    Eosinophils Relative 1  0 - 5 (%)    Eosinophils Absolute 0.1  0.0 - 0.7 (K/uL)    Basophils Relative 0  0 - 1 (%)    Basophils Absolute 0.0  0.0 - 0.1 (K/uL)   BASIC METABOLIC PANEL     Status: Normal   Collection Time   04/14/11 12:10 PM      Component Value Range Comment   Sodium 137  135 -  145 (mEq/L)    Potassium 4.0  3.5 - 5.1 (mEq/L)    Chloride 100  96 - 112 (mEq/L)    CO2 27  19 - 32 (mEq/L)    Glucose, Bld 85  70 - 99 (mg/dL)    BUN 9  6 - 23 (mg/dL)    Creatinine, Ser 9.14  0.50 - 1.35 (mg/dL)    Calcium 9.3  8.4 - 10.5 (mg/dL)    GFR calc non Af Amer >90  >90 (mL/min)    GFR calc Af Amer >90  >90 (mL/min)   LIPASE, BLOOD     Status: Normal   Collection Time   04/14/11 12:45 PM      Component Value Range Comment   Lipase 22  11 - 59 (U/L)   TYPE AND SCREEN     Status: Normal   Collection Time   04/14/11 12:45 PM      Component Value Range Comment   ABO/RH(D) O POS      Antibody Screen NEG      Sample Expiration 04/17/2011     PROTIME-INR     Status: Abnormal   Collection Time   04/14/11 12:45 PM      Component Value Range Comment   Prothrombin Time 39.6 (*) 11.6 - 15.2 (seconds)    INR 4.00 (*) 0.00 - 1.49    APTT     Status: Abnormal   Collection Time   04/14/11 12:45 PM      Component Value Range Comment   aPTT 54 (*) 24 - 37 (seconds)   URINALYSIS, ROUTINE W REFLEX MICROSCOPIC     Status: Abnormal   Collection Time   04/14/11  1:21 PM      Component Value Range Comment   Color, Urine STRAW (*) YELLOW     APPearance CLEAR  CLEAR     Specific Gravity, Urine 1.010  1.005 - 1.030     pH 5.5  5.0 - 8.0     Glucose, UA NEGATIVE  NEGATIVE (mg/dL)    Hgb urine dipstick NEGATIVE  NEGATIVE  Bilirubin Urine NEGATIVE  NEGATIVE     Ketones, ur NEGATIVE  NEGATIVE (mg/dL)    Protein, ur NEGATIVE  NEGATIVE (mg/dL)    Urobilinogen, UA 0.2  0.0 - 1.0 (mg/dL)    Nitrite NEGATIVE  NEGATIVE     Leukocytes, UA NEGATIVE  NEGATIVE  MICROSCOPIC NOT DONE ON URINES WITH NEGATIVE PROTEIN, BLOOD, LEUKOCYTES, NITRITE, OR GLUCOSE <1000 mg/dL.  HEPATIC FUNCTION PANEL     Status: Abnormal   Collection Time   04/14/11  2:27 PM      Component Value Range Comment   Total Protein 5.9 (*) 6.0 - 8.3 (g/dL)    Albumin 3.2 (*) 3.5 - 5.2 (g/dL)    AST 17  0 - 37 (U/L)    ALT 14  0  - 53 (U/L)    Alkaline Phosphatase 86  39 - 117 (U/L)    Total Bilirubin 0.1 (*) 0.3 - 1.2 (mg/dL)    Bilirubin, Direct <1.6  0.0 - 0.3 (mg/dL)    Indirect Bilirubin NOT CALCULATED  0.3 - 0.9 (mg/dL)   PROTIME-INR     Status: Abnormal   Collection Time   04/14/11  2:28 PM      Component Value Range Comment   Prothrombin Time 41.5 (*) 11.6 - 15.2 (seconds)    INR 4.25 (*) 0.00 - 1.49      Radiological Exams on Admission: No results found.  Assessment/Plan Principal Problem:  *Hematemesis Active Problems:  Supratherapeutic INR  Personal history of PE (pulmonary embolism)  Chronic abdominal pain  Plan:  Patient has a history of erosive esophagitis and has presented with upper GI bleeding in the setting of a supratherapeutic INR. He'll be admitted to telemetry bed for observation. We will start him on twice a day Protonix. He'll be on clear liquids for now and kept n.p.o. after midnight. We will have GI see him in consultation.  Patient is on full dose Lovenox for recently diagnosed pulmonary embolus of approximately 3 months ago. It was felt that PE was not related to his cancer at that time and a total of 6 months of anticoagulation was recommended. His Lovenox will obviously be held in the setting of acute bleeding. It is not clear what is causing the elevation of his INR. Patient adamantly denies that he has not been taking Coumadin. His liver function tests are within normal limits, and he does not appear toxic. I am not sure if he has inadvertently taken Coumadin from a prior prescription that he had filled. He may also be malnourished, vitamin K deficient. In any case he will receive vitamin K and we will monitor his response. We will also check vit b12 and folate levels. We will also ask hematology to follow for further work up of coagulopathy, and plans for long term anticoagulation  Patient requests FULL CODE.  Further orders per the clinical course.  Time Spent on  Admission:  MEMON,JEHANZEB [facility] 04/14/2011, 4:09 PM

## 2011-04-15 ENCOUNTER — Encounter (HOSPITAL_COMMUNITY): Payer: Federal, State, Local not specified - PPO

## 2011-04-15 ENCOUNTER — Other Ambulatory Visit (HOSPITAL_COMMUNITY): Payer: Self-pay | Admitting: Oncology

## 2011-04-15 ENCOUNTER — Encounter (HOSPITAL_COMMUNITY): Payer: Self-pay | Admitting: Gastroenterology

## 2011-04-15 DIAGNOSIS — K920 Hematemesis: Secondary | ICD-10-CM

## 2011-04-15 DIAGNOSIS — C189 Malignant neoplasm of colon, unspecified: Secondary | ICD-10-CM

## 2011-04-15 DIAGNOSIS — K700 Alcoholic fatty liver: Secondary | ICD-10-CM

## 2011-04-15 DIAGNOSIS — I2699 Other pulmonary embolism without acute cor pulmonale: Secondary | ICD-10-CM

## 2011-04-15 LAB — COMPREHENSIVE METABOLIC PANEL
BUN: 5 mg/dL — ABNORMAL LOW (ref 6–23)
CO2: 29 mEq/L (ref 19–32)
Calcium: 9.1 mg/dL (ref 8.4–10.5)
Chloride: 104 mEq/L (ref 96–112)
Creatinine, Ser: 0.78 mg/dL (ref 0.50–1.35)
GFR calc non Af Amer: >90 mL/min (ref >90)
Total Bilirubin: 0.2 mg/dL — ABNORMAL LOW (ref 0.3–1.2)

## 2011-04-15 LAB — FOLATE RBC: RBC Folate: 807 ng/mL — ABNORMAL HIGH (ref >=366)

## 2011-04-15 LAB — PROTIME-INR: Prothrombin Time: 30.4 seconds — ABNORMAL HIGH (ref 11.6–15.2)

## 2011-04-15 LAB — CBC
Platelets: 241 10*3/uL (ref 150–400)
RBC: 3.89 MIL/uL — ABNORMAL LOW (ref 4.22–5.81)
WBC: 8.1 10*3/uL (ref 4.0–10.5)

## 2011-04-15 MED ORDER — SODIUM CHLORIDE 0.9 % IJ SOLN
INTRAMUSCULAR | Status: AC
  Administered 2011-04-15: 13:00:00
  Filled 2011-04-15: qty 6

## 2011-04-15 MED ORDER — SODIUM CHLORIDE 0.9 % IJ SOLN
INTRAMUSCULAR | Status: AC
  Administered 2011-04-15: 22:00:00
  Filled 2011-04-15: qty 3

## 2011-04-15 NOTE — Progress Notes (Signed)
[facility] Chart Review Completed  

## 2011-04-15 NOTE — Progress Notes (Signed)
   To whom it may concern,  Mr. Nuri Larmer. [name] is a 61 year old gentleman who is well known to the [facility] where he was treated for Stage IIIB Colon Cancer in the adjuvant setting following resection.  The patient completed 7 out of 12 planned cycles of FOLFOX chemotherapy.  This therapy was discontinued.   We are monitoring the patient with regular lab work and physical exams.    If you have any questions or concerns, please do not hesitate to call the [facility] at [phone].  Sincerely and with best regards,      Jermya Dowding

## 2011-04-15 NOTE — Consult Note (Addendum)
Referring Provider: Erick Blinks, MD Primary Care Physician:  [name], MD, MD Primary Gastroenterologist:  Roetta Sessions, MD  Reason for Consultation:  hematemesis  HPI: [name] is a 61 y.o. male admitted yesterday with complaints of hematemesis. Patient is well-known to our practice with history of erosive reflux esophagitis, colon cancer (completed treatment 2012). We last saw the patient in December at time of surveillance colonoscopy where he had multiple adenomatous polyps. EGD twice last year in July and September for hematemesis. See below for details.  Patient reports that he is nauseated almost days. Describes some early satiety. Symptoms have been persisting for several months although he did not report this to [place] at time of his colonoscopy. Vomiting yesterday morning, vomited fresh blood twice before presenting to the emergency department. Last episode in ED, streaks of blood. Stabbing epigastric pain for months, most days. BM good. No melena, brbpr. No weight loss. No dysphagia. No heartburn. Eats well at home, very little meat by preference. Eats lots of fruits and vegetables. States he eats plenty.  In the emergency department his INR was greater than 4. He states the last time he took Coumadin was in June of last year. Pharmacy was called and this was verified that had not been filled in months. He takes Lovenox daily for history of PE. Apparently had bleeding issues on Coumadin.   No ASA or NSAIDS.   Prior to Admission medications   Medication Sig Start Date End Date Taking? Authorizing Provider  CARAFATE 1 GM/10ML suspension Take 15 mg by mouth 4 times daily. 02/22/11  Yes Historical Provider, MD  cyanocobalamin (,VITAMIN B-12,) 1000 MCG/ML injection Inject 1,000 mcg into the muscle every 30 (thirty) days.   Yes Historical Provider, MD  dexlansoprazole (DEXILANT) 60 MG capsule Take 1 capsule (60 mg total) by mouth daily. 03/22/11  Yes Tana Coast, PA  enoxaparin  (LOVENOX) 60 MG/0.6ML SOLN Inject 0.6 mLs (60 mg total) into the skin at bedtime. 03/25/11  Yes Dellis Anes, PA  feeding supplement (ENSURE CLINICAL STRENGTH) LIQD Take 237 mLs by mouth 2 (two) times daily between meals. 10/01/10  Yes Elliot Cousin, MD  folic acid (FOLVITE) 1 MG tablet Take 1 mg by mouth daily.     Yes Historical Provider, MD  LORazepam (ATIVAN) 1 MG tablet Take 1 mg by mouth every 4 (four) hours as needed. Nausea and Vomiting   Yes Historical Provider, MD  prochlorperazine (COMPAZINE) 10 MG tablet TAKE ONE TABLET BY MOUTH EVERY 6 HOURS AS NEEDED FOR NAUSEA 03/13/11  Yes Dellis Anes, PA  sertraline (ZOLOFT) 50 MG tablet Take 50 mg by mouth at bedtime.    Yes Historical Provider, MD  thiamine 100 MG tablet Take 100 mg by mouth daily.     Yes Historical Provider, MD  traMADol (ULTRAM) 50 MG tablet Take 50 mg by mouth 4 (four) times daily as needed. For pain 02/03/11  Yes Historical Provider, MD  valsartan-hydrochlorothiazide (DIOVAN-HCT) 80-12.5 MG per tablet Take 1 tablet by mouth daily.   Yes Historical Provider, MD    Current Facility-Administered Medications  Medication Dose Route Frequency Provider Last Rate Last Dose  . acetaminophen (TYLENOL) tablet 650 mg  650 mg Oral Q6H PRN Erick Blinks, MD       Or  . acetaminophen (TYLENOL) suppository 650 mg  650 mg Rectal Q6H PRN Erick Blinks, MD      . feeding supplement (ENSURE CLINICAL STRENGTH) liquid 237 mL  237 mL Oral BID BM Erick Blinks, MD      .  folic acid (FOLVITE) tablet 1 mg  1 mg Oral Daily Erick Blinks, MD   1 mg at 04/14/11 1700  . olmesartan (BENICAR) tablet 10 mg  10 mg Oral Daily Erick Blinks, MD   10 mg at 04/14/11 1713   And  . hydrochlorothiazide (HYDRODIURIL) tablet 12.5 mg  12.5 mg Oral Daily Erick Blinks, MD   12.5 mg at 04/14/11 1713  . LORazepam (ATIVAN) tablet 1 mg  1 mg Oral Q4H PRN Erick Blinks, MD      . morphine 2 MG/ML injection 1-2 mg  1-2 mg Intravenous Q4H PRN Erick Blinks, MD    2 mg at 04/15/11 0425  . morphine 4 MG/ML injection 4 mg  4 mg Intravenous Once [name], MD   4 mg at 04/14/11 1400  . ondansetron (ZOFRAN) injection 4 mg  4 mg Intravenous Once [name], MD   4 mg at 04/14/11 1400  . ondansetron (ZOFRAN) tablet 4 mg  4 mg Oral Q6H PRN Erick Blinks, MD       Or  . ondansetron (ZOFRAN) injection 4 mg  4 mg Intravenous Q6H PRN Erick Blinks, MD   4 mg at 04/14/11 2310  . pantoprazole (PROTONIX) injection 40 mg  40 mg Intravenous Q12H Erick Blinks, MD   40 mg at 04/14/11 2119  . phytonadione (VITAMIN K) tablet 10 mg  10 mg Oral Q24H Erick Blinks, MD   10 mg at 04/14/11 1713  . sertraline (ZOLOFT) tablet 50 mg  50 mg Oral QHS Erick Blinks, MD   50 mg at 04/14/11 2119  . sodium chloride 0.9 % bolus 500 mL  500 mL Intravenous Once [name], MD   500 mL at 04/14/11 1321  . thiamine (VITAMIN B-1) tablet 100 mg  100 mg Oral Daily Erick Blinks, MD   100 mg at 04/14/11 1713  . traMADol (ULTRAM) tablet 50 mg  50 mg Oral Q6H PRN Erick Blinks, MD      . DISCONTD: 0.9 %  sodium chloride infusion   Intravenous Continuous [name], MD 125 mL/hr at 04/14/11 1321    . DISCONTD: valsartan-hydrochlorothiazide (DIOVAN-HCT) 80-12.5 MG per tablet 1 tablet  1 tablet Oral Daily Erick Blinks, MD        Allergies as of 04/14/2011  . (No Known Allergies)    Past Medical History  Diagnosis Date  . 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 Pulmonary embolism 02/2010  . Acid reflux   . Hypertension   . Osteoporosis   . Arthritis   . TIA (transient ischemic attack) 10/11  . ETOH abuse     quit 03/2010  . 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 Personal history of PE (pulmonary embolism) 10/01/2010  . Blood transfusion   . S/P endoscopy September 28, 2010    erosive reflux esophagitis, Billroth I anatomy  . Angina   . Shortness of breath   . Sleep apnea   . Recurrent upper respiratory infection (URI)   . Anxiety   .  Pneumonia   . Anemia     Past Surgical History  Procedure Date  . Hernia repair     right inguinal  . Appendectomy 1980s  . Cholecystectomy 1980s  . Colon surgery May 2012    left hemicolectomy  . Portacath placement   . Abdominal sugery     for bowel obstruction x 8  . Esophagogastroduodenoscopy 09/28/2010  . Esophagogastroduodenoscopy 12/01/2010  Cervical web status post dilation, erosive esophagitis, B1 hemigastrectomy, inflamed anastomosis  . Colonoscopy 03/18/2011    anastomosis at 35cm. Several adenomatous polyps removed. Sigmoid diverticulosis. Next TCS 02/2013    Family History  Problem Relation Age of Onset  . Hypertension Mother   . Arthritis Mother   . Hypertension Father   . Colon cancer Neg Hx     History   Social History  . Marital Status: Married    Spouse Name: N/A    Number of Children: 3  . Years of Education: N/A   Occupational History  .  [place] [employer]   Social History Main Topics  . Smoking status: Current Everyday Smoker -- 0.5 packs/day for 40 years    Types: Cigarettes  . Smokeless tobacco: Not on file  . Alcohol Use: No     last drink was Apr 16 2010  . Drug Use: No  . Sexually Active: Yes   Other Topics Concern  . Not on file   Social History Narrative  . No narrative on file     ROS:  General: Negative for anorexia, weight loss, fever, chills, fatigue, weakness. Eyes: Negative for vision changes.  ENT: Negative for hoarseness, difficulty swallowing , nasal congestion. CV: Negative for chest pain, angina, palpitations, dyspnea on exertion, peripheral edema.  Respiratory: Negative for dyspnea at rest, dyspnea on exertion, cough, sputum, wheezing.  GI: See history of present illness. GU:  Negative for dysuria, hematuria, urinary incontinence, urinary frequency, nocturnal urination.  MS: Negative for joint pain, low back pain.  Derm: Negative for rash or itching.  Neuro: Negative for weakness, abnormal sensation, seizure,  frequent headaches, memory loss, confusion.  Psych: Negative for anxiety, depression, suicidal ideation, hallucinations.  Endo: Negative for unusual weight change.  Heme: Negative for bruising or bleeding. Allergy: Negative for rash or hives.       Physical Examination: Vital signs in last 24 hours: Temp:  [98.2 F (36.8 C)-98.9 F (37.2 C)] 98.2 F (36.8 C) (01/24 0525) Pulse Rate:  [70-80] 70  (01/24 0525) Resp:  [16-18] 18  (01/24 0525) BP: (121-159)/(80-94) 121/80 mmHg (01/24 0525) SpO2:  [96 %-100 %] 96 % (01/24 0525) Weight:  [134 lb 0.6 oz (60.8 kg)-135 lb (61.236 kg)] 134 lb 0.6 oz (60.8 kg) (01/23 1700) Last BM Date: 04/12/11  General: Thin white male in no acute distress.  Head: Normocephalic, atraumatic.   Eyes: Conjunctiva pink, no icterus. Mouth: Oropharyngeal mucosa moist and pink , no lesions erythema or exudate. Neck: Supple without thyromegaly, masses, or lymphadenopathy.  Lungs: Clear to auscultation bilaterally.  Heart: Regular rate and rhythm, no murmurs rubs or gallops.  Abdomen: Bowel sounds are normal. Mild epigastric pain. Nondistended, no hepatosplenomegaly or masses, no abdominal bruits or    hernia , no rebound or guarding.   Rectal: Not performed. Extremities: No lower extremity edema, clubbing, deformity.  Neuro: Alert and oriented x 4 , grossly normal neurologically.  Skin: Warm and dry, no rash or jaundice.   Psych: Alert and cooperative, normal mood and affect.        Intake/Output from previous day: 01/23 0701 - 01/24 0700 In: 500 [I.V.:500] Out: 1850 [Urine:1850] Intake/Output this shift:    Lab Results: CBC  Basename 04/15/11 0449 04/14/11 1210  WBC 8.1 12.2*  HGB 12.4* 12.0*  HCT 37.0* 35.9*  MCV 95.1 94.0  PLT 241 255  03/03/11: hgb 11.6  BMET  Basename 04/15/11 0449 04/14/11 1210  NA 140 137  K 4.2 4.0  CL 104 100  CO2 29 27  GLUCOSE 96 85  BUN 5* 9  CREATININE 0.78 0.67  CALCIUM 9.1 9.3   LFT  Basename 04/15/11  0449 04/14/11 1427  BILITOT 0.2* 0.1*  BILIDIR -- <0.1  IBILI -- NOT CALCULATED  ALKPHOS 90 86  AST 23 17  ALT 16 14  PROT 6.0 5.9*  ALBUMIN 3.1* 3.2*     PT/INR  Basename 04/15/11 0449 04/14/11 1428 04/14/11 1245  LABPROT 30.4* 41.5* 39.6*  INR 2.85* 4.25* 4.00*      Imaging Studies: OLD CT 11/2010: mild prominence of panc duct to level of ampulla. CBD 8mm. Stomach and proximal small bowel distended. Duodenal measures up to 5.7cm. Jejunal loops decompressed with proximal anastomotic suture noted.  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 History of erosive reflux esophagitis and inflamed anastomosis on prior EGDs in July and September of last year. Complains of continued epigastric pain, frequent nausea, intermittent vomiting, early satiety. Old CT from September showed mild prominence of the pancreatic duct, mild CBD dilation 8 mm status post cholecystectomy. Interestingly he had a dilated stomach and proximal small bowel above the anastomosis. He may have bleeding from erosive reflux esophagitis, Mallory-Weiss tear, anastomotic lesions. Would be concerned he is having delayed gastric emptying as a cause of chronic symptoms. He presented with INR of 4 on Lovenox. Denies Coumadin in months. INR down to 2.85 today after vitamin K 10 mg by mouth yesterday. At this point, H/H stable. Continue to monitor.  Plan: #1. Clear liquid diet today.  #2. Possible EGD tomorrow, if INR 1.6 or less. #3. Continue IV protonix q 12 hours. #4. Agree with hematology consult. #5. He may benefit from UGI/SBFT +/-GES as well. #6. Further vitamin K/management of elevated INR per attending.   I would like to thank Dr. Kerry Hough for allowing [place] to take part in the care of this nice patient.  LOS: 1 day   Tana Coast  04/15/2011, 7:54 AM  Pt  seen and examined; agree with above assessment and recommendations.   Patient's recurrent periodic hematemesis not likely his primary problem. I suspect he has something like a Mallory-Weiss equivalent in a setting of anticoagulation and recurrent nausea / vomiting. I have offered the patient an EGD to further evaluate his hematemesis.The risks, benefits, limitations, alternatives and imponderables have been reviewed with the patient. Potential for esophageal dilation, biopsy, etc. have also been reviewed.  Questions have been answered. All parties agreeable.  The patient's ongoing upper abdominal pain postprandial nausea since his colon cancer surgery are bothersome. CT findings noted last fall regarding a dilated duodenum may need to be revisited. Further recommendations to follow  EGD.  The risks, benefits, limitations, alternatives and imponderables have been reviewed with the patient. Potential for esophageal dilation, biopsy, etc. have also been reviewed.  Questions have been answered. All parties agreeable.   In addition, I reviewed last fall abdominal CT with Dr. Tyron Russell. 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 Given this finding and a history of relatively advanced colon cancer, he recommends a CT of the abdomen and pelvis with and without contrast to further evaluate. We'll consider this pending results of the EGD tomorrow.

## 2011-04-15 NOTE — Progress Notes (Signed)
Subjective: No vomiting overnight, has a headache, no other complaints.  Objective: Vital signs in last 24 hours: Temp:  [98.2 F (36.8 C)-98.9 F (37.2 C)] 98.2 F (36.8 C) (01/24 0525) Pulse Rate:  [70-80] 70  (01/24 0525) Resp:  [16-18] 18  (01/24 0525) BP: (121-156)/(80-93) 121/80 mmHg (01/24 0525) SpO2:  [96 %-98 %] 96 % (01/24 0525) Weight:  [60.8 kg (134 lb 0.6 oz)] 60.8 kg (134 lb 0.6 oz) (01/23 1700) Weight change:  Last BM Date: 04/12/11  Intake/Output from previous day: 01/23 0701 - 01/24 0700 In: 500 [I.V.:500] Out: 1850 [Urine:1850] Total I/O In: -  Out: 400 [Urine:400]   Physical Exam: General: Alert, awake, oriented x3, in no acute distress. HEENT: No bruits, no goiter. Heart: Regular rate and rhythm, without murmurs, rubs, gallops. Lungs: Clear to auscultation bilaterally. Abdomen: Soft, nontender, nondistended, positive bowel sounds. Extremities: No clubbing cyanosis or edema with positive pedal pulses. Neuro: Grossly intact, nonfocal.    Lab Results: Basic Metabolic Panel:  Basename 04/15/11 0449 04/14/11 1210  NA 140 137  K 4.2 4.0  CL 104 100  CO2 29 27  GLUCOSE 96 85  BUN 5* 9  CREATININE 0.78 0.67  CALCIUM 9.1 9.3  MG -- --  PHOS -- --   Liver Function Tests:  Basename 04/15/11 0449 04/14/11 1427  AST 23 17  ALT 16 14  ALKPHOS 90 86  BILITOT 0.2* 0.1*  PROT 6.0 5.9*  ALBUMIN 3.1* 3.2*    Basename 04/14/11 1245  LIPASE 22  AMYLASE --   No results found for this basename: AMMONIA:2 in the last 72 hours CBC:  Basename 04/15/11 0449 04/14/11 1210  WBC 8.1 12.2*  NEUTROABS -- 8.9*  HGB 12.4* 12.0*  HCT 37.0* 35.9*  MCV 95.1 94.0  PLT 241 255   Cardiac Enzymes: No results found for this basename: CKTOTAL:3,CKMB:3,CKMBINDEX:3,TROPONINI:3 in the last 72 hours BNP: No results found for this basename: PROBNP:3 in the last 72 hours D-Dimer: No results found for this basename: DDIMER:2 in the last 72 hours CBG: No results  found for this basename: GLUCAP:6 in the last 72 hours Hemoglobin A1C: No results found for this basename: HGBA1C in the last 72 hours Fasting Lipid Panel: No results found for this basename: CHOL,HDL,LDLCALC,TRIG,CHOLHDL,LDLDIRECT in the last 72 hours Thyroid Function Tests: No results found for this basename: TSH,T4TOTAL,FREET4,T3FREE,THYROIDAB in the last 72 hours Anemia Panel:  Basename 04/14/11 1428  VITAMINB12 524  FOLATE --  FERRITIN --  TIBC --  IRON --  RETICCTPCT --   Coagulation:  Basename 04/15/11 0449 04/14/11 1428  LABPROT 30.4* 41.5*  INR 2.85* 4.25*   Urine Drug Screen: Drugs of Abuse     Component Value Date/Time   LABOPIA NONE DETECTED 04/09/2010 2319   COCAINSCRNUR NONE DETECTED 04/09/2010 2319   LABBENZ POSITIVE* 04/09/2010 2319   AMPHETMU NONE DETECTED 04/09/2010 2319   THCU NONE DETECTED 04/09/2010 2319   LABBARB  Value: NONE DETECTED        DRUG SCREEN FOR MEDICAL PURPOSES ONLY.  IF CONFIRMATION IS NEEDED FOR ANY PURPOSE, NOTIFY LAB WITHIN 5 DAYS.        LOWEST DETECTABLE LIMITS FOR URINE DRUG SCREEN Drug Class       Cutoff (ng/mL) Amphetamine      1000 Barbiturate      200 Benzodiazepine   200 Tricyclics       300 Opiates          300 Cocaine  300 THC              50 04/09/2010 2319    Alcohol Level: No results found for this basename: ETH:2 in the last 72 hours No results found for this or any previous visit (from the past 240 hour(s)).  Studies/Results: No results found.  Medications: Scheduled Meds:   . feeding supplement  237 mL Oral BID BM  . folic acid  1 mg Oral Daily  . olmesartan  10 mg Oral Daily   And  . hydrochlorothiazide  12.5 mg Oral Daily  . morphine  4 mg Intravenous Once  . ondansetron  4 mg Intravenous Once  . pantoprazole (PROTONIX) IV  40 mg Intravenous Q12H  . phytonadione  10 mg Oral Q24H  . sertraline  50 mg Oral QHS  . sodium chloride  500 mL Intravenous Once  . thiamine  100 mg Oral Daily  . DISCONTD:  valsartan-hydrochlorothiazide  1 tablet Oral Daily   Continuous Infusions:   . DISCONTD: sodium chloride 125 mL/hr at 04/14/11 1321   PRN Meds:.acetaminophen, acetaminophen, LORazepam, morphine, ondansetron (ZOFRAN) IV, ondansetron, traMADol  Assessment/Plan:  Principal Problem:  *Hematemesis Active Problems:  Supratherapeutic INR  Personal history of PE (pulmonary embolism)  Chronic abdominal pain  Plan:  Hemoglobin is stable Appreciate GI/HemOnc input Patient received Vit K yesterday due to concern of bleeding.  INR improved today, continue vit k.  Coumadin level pending Plans for possible EGD noted. Continue to follow hemoglobin and any further signs of bleeding.   LOS: 1 day   MEMON,JEHANZEB [facility] 04/15/2011, 11:19 AM

## 2011-04-15 NOTE — Consult Note (Signed)
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  The patient has a past medical history significant for hematemesis, partial gastrectomy, abdominal pain secondary to multiple abdominal surgeries, anemia of chronic disease, erosive esophagitis, and a history of EtOH abuse.  The patient presented to the ER yesterday with hematemisis.  The patient is on Lovenox for treatment of his PE which will require 6 months of anticoagulation.  It was noted that the patient was supra therapeutic with an INR of 4.00.  He reports that he is not taking any Coumadin and has not taken any in a number of months.  His pharmacy was contacted and they verify that he has not had any Cumadin refill in a number of months.  The patient reports that he has gained approximately 10 lbs since we saw him last.  This conversation was prompted by my noticing some fullness to his face.  He reports that his appetite is strong and he eats a small amount of meat products, but he reports that he loves green vegetables.  As a result, I believe he is acquiring an adequate amount of Vitamin K in his diet.  The patient admits to his chronic nausea and abdominal pain, although I believe his abdominal discomfort is improved compared to our last interaction.  The patient denies any complaints presently.    Hematology was asked to see the patient do to his supra therapeutic INR in the setting of no Coumadin and to help facilitate his future anticoagulation for his  PE.  At the end of our conversation, I asked him if there is anything I can do to make him more comfortable, and asks me to compose a letter regarding his history of cancer and his prognosis for a disability claim.  With the help of Dr. Mariel Sleet, I will construct a letter for the patient.    PAST MEDICAL HISTORY:   Past Medical History  Diagnosis Date  . 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 Pulmonary embolism 02/2010  . Acid reflux   . Hypertension   . Osteoporosis   . Arthritis   . TIA (transient ischemic attack) 10/11  . ETOH abuse     quit 03/2010  . 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 Personal history of PE (pulmonary embolism) 10/01/2010  . Blood transfusion   . S/P endoscopy September 28, 2010    erosive reflux esophagitis, Billroth I anatomy  . Angina   . Shortness of breath   . Sleep apnea   . Recurrent upper respiratory infection (URI)   . Anxiety   . Pneumonia   . Anemia     ALLERGIES: No Known Allergies    MEDICATIONS: I have reviewed the patient's current medications.     PAST SURGICAL HISTORY Past Surgical History  Procedure Date  . Hernia repair     right inguinal  . Appendectomy 1980s  . Cholecystectomy 1980s  .  Colon surgery May 2012    left hemicolectomy  . Portacath placement   . Abdominal sugery     for bowel obstruction x 8  . Esophagogastroduodenoscopy 09/28/2010  . Esophagogastroduodenoscopy 12/01/2010    Cervical web status post dilation, erosive esophagitis, B1 hemigastrectomy, inflamed anastomosis  . Colonoscopy 03/18/2011    anastomosis at 35cm. Several adenomatous polyps removed. Sigmoid diverticulosis. Next TCS 02/2013    FAMILY HISTORY: Family History  Problem Relation Age of Onset  . Hypertension Mother   . Arthritis Mother   . Hypertension Father   . Colon cancer Neg Hx     SOCIAL HISTORY:  reports that he has been smoking Cigarettes.  He has a 20 pack-year smoking history. He does not have any  smokeless tobacco history on file. He reports that he does not drink alcohol or use illicit drugs.  PERFORMANCE STATUS: The patient's performance status is 1 - Symptomatic but completely ambulatory  PHYSICAL EXAM: Most Recent Vital Signs: Blood pressure 121/80, pulse 70, temperature 98.2 F (36.8 C), temperature source Oral, resp. rate 18, height 5\' 9"  (1.753 m), weight 134 lb 0.6 oz (60.8 kg), SpO2 96.00%. BP 121/80  Pulse 70  Temp(Src) 98.2 F (36.8 C) (Oral)  Resp 18  Ht 5\' 9"  (1.753 m)  Wt 134 lb 0.6 oz (60.8 kg)  BMI 19.79 kg/m2  SpO2 96%  General Appearance:    Alert, cooperative, no distress, appears stated age  Head:    Normocephalic, without obvious abnormality, atraumatic  Eyes:    PERRL, conjunctiva/corneas clear, EOM's intact, fundi    benign, both eyes       Ears:    Normal TM's and external ear canals, both ears  Nose:   Nares normal, septum midline, mucosa normal, no drainage    or sinus tenderness  Throat:   Lips, mucosa, and tongue normal; teeth and gums normal  Neck:   Supple, symmetrical, trachea midline, no adenopathy;       thyroid:  No enlargement/tenderness/nodules; no carotid   bruit or JVD  Back:     Symmetric, no curvature, ROM normal, no CVA tenderness  Lungs:     Clear to auscultation bilaterally, respirations unlabored  Chest wall:    No tenderness or deformity  Heart:    Regular rate and rhythm, S1 and S2 normal, no murmur, rub   or gallop  Abdomen:     Soft, non-tender, bowel sounds active all four quadrants,    no masses, no organomegaly        Extremities:   Extremities normal, atraumatic, no cyanosis or edema  Pulses:   2+ and symmetric all extremities  Skin:   Skin color, texture, turgor normal, no rashes or lesions  Lymph nodes:   Cervical, supraclavicular, and axillary nodes normal  Neurologic:   CNII-XII intact. Normal strength, sensation and reflexes      throughout    LABORATORY DATA:  Results for orders placed during the hospital  encounter of 04/14/11 (from the past 48 hour(s))  CBC     Status: Abnormal   Collection Time   04/14/11 12:10 PM      Component Value Range Comment   WBC 12.2 (*) 4.0 - 10.5 (K/uL)    RBC 3.82 (*) 4.22 - 5.81 (MIL/uL)    Hemoglobin 12.0 (*) 13.0 - 17.0 (g/dL)    HCT 40.9 (*) 81.1 - 52.0 (%)    MCV 94.0  78.0 - 100.0 (fL)    MCH 31.4  26.0 - 34.0 (  pg)    MCHC 33.4  30.0 - 36.0 (g/dL)    RDW 16.1  09.6 - 04.5 (%)    Platelets 255  150 - 400 (K/uL)   DIFFERENTIAL     Status: Abnormal   Collection Time   04/14/11 12:10 PM      Component Value Range Comment   Neutrophils Relative 73  43 - 77 (%)    Neutro Abs 8.9 (*) 1.7 - 7.7 (K/uL)    Lymphocytes Relative 18  12 - 46 (%)    Lymphs Abs 2.2  0.7 - 4.0 (K/uL)    Monocytes Relative 8  3 - 12 (%)    Monocytes Absolute 1.0  0.1 - 1.0 (K/uL)    Eosinophils Relative 1  0 - 5 (%)    Eosinophils Absolute 0.1  0.0 - 0.7 (K/uL)    Basophils Relative 0  0 - 1 (%)    Basophils Absolute 0.0  0.0 - 0.1 (K/uL)   BASIC METABOLIC PANEL     Status: Normal   Collection Time   04/14/11 12:10 PM      Component Value Range Comment   Sodium 137  135 - 145 (mEq/L)    Potassium 4.0  3.5 - 5.1 (mEq/L)    Chloride 100  96 - 112 (mEq/L)    CO2 27  19 - 32 (mEq/L)    Glucose, Bld 85  70 - 99 (mg/dL)    BUN 9  6 - 23 (mg/dL)    Creatinine, Ser 4.09  0.50 - 1.35 (mg/dL)    Calcium 9.3  8.4 - 10.5 (mg/dL)    GFR calc non Af Amer >90  >90 (mL/min)    GFR calc Af Amer >90  >90 (mL/min)   LIPASE, BLOOD     Status: Normal   Collection Time   04/14/11 12:45 PM      Component Value Range Comment   Lipase 22  11 - 59 (U/L)   TYPE AND SCREEN     Status: Normal   Collection Time   04/14/11 12:45 PM      Component Value Range Comment   ABO/RH(D) O POS      Antibody Screen NEG      Sample Expiration 04/17/2011     PROTIME-INR     Status: Abnormal   Collection Time   04/14/11 12:45 PM      Component Value Range Comment   Prothrombin Time 39.6 (*) 11.6 - 15.2  (seconds)    INR 4.00 (*) 0.00 - 1.49    APTT     Status: Abnormal   Collection Time   04/14/11 12:45 PM      Component Value Range Comment   aPTT 54 (*) 24 - 37 (seconds)   URINALYSIS, ROUTINE W REFLEX MICROSCOPIC     Status: Abnormal   Collection Time   04/14/11  1:21 PM      Component Value Range Comment   Color, Urine STRAW (*) YELLOW     APPearance CLEAR  CLEAR     Specific Gravity, Urine 1.010  1.005 - 1.030     pH 5.5  5.0 - 8.0     Glucose, UA NEGATIVE  NEGATIVE (mg/dL)    Hgb urine dipstick NEGATIVE  NEGATIVE     Bilirubin Urine NEGATIVE  NEGATIVE     Ketones, ur NEGATIVE  NEGATIVE (mg/dL)    Protein, ur NEGATIVE  NEGATIVE (mg/dL)    Urobilinogen, UA 0.2  0.0 - 1.0 (mg/dL)  Nitrite NEGATIVE  NEGATIVE     Leukocytes, UA NEGATIVE  NEGATIVE  MICROSCOPIC NOT DONE ON URINES WITH NEGATIVE PROTEIN, BLOOD, LEUKOCYTES, NITRITE, OR GLUCOSE <1000 mg/dL.  HEPATIC FUNCTION PANEL     Status: Abnormal   Collection Time   04/14/11  2:27 PM      Component Value Range Comment   Total Protein 5.9 (*) 6.0 - 8.3 (g/dL)    Albumin 3.2 (*) 3.5 - 5.2 (g/dL)    AST 17  0 - 37 (U/L)    ALT 14  0 - 53 (U/L)    Alkaline Phosphatase 86  39 - 117 (U/L)    Total Bilirubin 0.1 (*) 0.3 - 1.2 (mg/dL)    Bilirubin, Direct <1.6  0.0 - 0.3 (mg/dL)    Indirect Bilirubin NOT CALCULATED  0.3 - 0.9 (mg/dL)   PROTIME-INR     Status: Abnormal   Collection Time   04/14/11  2:28 PM      Component Value Range Comment   Prothrombin Time 41.5 (*) 11.6 - 15.2 (seconds)    INR 4.25 (*) 0.00 - 1.49    VITAMIN B12     Status: Normal   Collection Time   04/14/11  2:28 PM      Component Value Range Comment   Vitamin B-12 524  211 - 911 (pg/mL)   PROTIME-INR     Status: Abnormal   Collection Time   04/15/11  4:49 AM      Component Value Range Comment   Prothrombin Time 30.4 (*) 11.6 - 15.2 (seconds)    INR 2.85 (*) 0.00 - 1.49    COMPREHENSIVE METABOLIC PANEL     Status: Abnormal   Collection Time   04/15/11   4:49 AM      Component Value Range Comment   Sodium 140  135 - 145 (mEq/L)    Potassium 4.2  3.5 - 5.1 (mEq/L)    Chloride 104  96 - 112 (mEq/L)    CO2 29  19 - 32 (mEq/L)    Glucose, Bld 96  70 - 99 (mg/dL)    BUN 5 (*) 6 - 23 (mg/dL)    Creatinine, Ser 1.09  0.50 - 1.35 (mg/dL)    Calcium 9.1  8.4 - 10.5 (mg/dL)    Total Protein 6.0  6.0 - 8.3 (g/dL)    Albumin 3.1 (*) 3.5 - 5.2 (g/dL)    AST 23  0 - 37 (U/L)    ALT 16  0 - 53 (U/L)    Alkaline Phosphatase 90  39 - 117 (U/L)    Total Bilirubin 0.2 (*) 0.3 - 1.2 (mg/dL)    GFR calc non Af Amer >90  >90 (mL/min)    GFR calc Af Amer >90  >90 (mL/min)   CBC     Status: Abnormal   Collection Time   04/15/11  4:49 AM      Component Value Range Comment   WBC 8.1  4.0 - 10.5 (K/uL)    RBC 3.89 (*) 4.22 - 5.81 (MIL/uL)    Hemoglobin 12.4 (*) 13.0 - 17.0 (g/dL)    HCT 60.4 (*) 54.0 - 52.0 (%)    MCV 95.1  78.0 - 100.0 (fL)    MCH 31.9  26.0 - 34.0 (pg)    MCHC 33.5  30.0 - 36.0 (g/dL)    RDW 98.1  19.1 - 47.8 (%)    Platelets 241  150 - 400 (K/uL)  PATHOLOGY:     03/18/2011 Diagnosis 1. Colon, polyp(s), cecal - TUBULAR ADENOMA. - HIGH GRADE DYSPLASIA IS NOT IDENTIFIED. 2. Colon, polyp(s), @ anastomosis - SERRATED ADENOMA. - HIGH GRADE DYSPLASIA IS NOT IDENTIFIED. 3. Colon, polyp(s), sigmoid at 25 - TUBULAR ADENOMA. - HIGH GRADE DYSPLASIA IS NOT IDENTIFIED. Pecola Leisure MD Pathologist, Electronic Signature (Case signed 03/19/2011)   07/31/2010 Diagnosis 1. Colon, segmental resection for tumor, left colon - COLONIC ADENOCARCINOMA WITH MUCINOUS FEATURES EXTENDING INTO PERICOLONIC CONNECTIVE TISSUE. - MARGINS NOT INVOLVED. - METASTATIC CARCINOMA IN ONE OF EITHTEEN LYMPH NODES (1/18). - SEE MICROSCOPIC DESCRIPTION. 2. Soft tissue mass, simple excision, mesenteric - FINDINGS CONSISTENT WITH PARTIALLY FIBROTIC APPENDICES EPIPLOICA. NO EVIDENCE OF MALIGNANCY. Microscopic Comment 1. COLON AND RECTUM Specimen: Left  colon. Procedure: Segmental resection. Tumor site: Left colon. Specimen integrity: Intact. Macroscopic tumor perforation: No. Invasive tumor: Maximum size: 2.5 cm. Histologic type(s): Colorectal adenocarcinoma with mucinous features. Histologic grade and differentiation: G2: moderately differentiated/low grade Microscopic extension of invasive tumor: Into pericolonic connective tissue and focally into subserosal connective tissue. Lymph-Vascular invasion: Present. Peri-neural invasion: Not identified. Tumor deposit(s) (discontinuous extramural extension): No. Resection margins: Proximal margin: Free of tumor. Distal margin: Free of tumor. Mesenteric margin (sigmoid and transverse): No. Treatment effect (neoadjuvant therapy): No. Number of lymph nodes examined 18; Number positive 1; see comment. 1 of 3 Amended copy Addendum FINAL for [place], Ugo B ([id number]) Microscopic Comment(continued) Additional polyp(s): Tubular adenoma. Non-neoplastic findings: N/A. Pathologic Staging: pT3, pN1a, pMX. Ancillary studies: KRAS and MSI. Comment: Some of the smaller lymph nodes have subcapsular mucin without identifiable associated tumor cells. JDP/eps 08/03/10 ADDENDUM: KRAS mutation analysis performed at [facility] showed wild type (no mutation identified). 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 The comment is quoted as follows: "Macrosatellite instability (MSI) due to defective mismatch repair genes have been reported in a subset of sporadic colorectal adenocarcinomas as well as in adenocarcinomas arising in patients with Lynch syndrome (hereditary non-polyposis colorectal cancer or HNPCC). As a consequence of this alteration, these adenocarcinomas are all associated with high level microsatellite instability (MSI) by PCR, which is closely associated with loss of expression of one or more of the mismatched repair (MMR) enzymes (MLH1, MSH2, MSH6, and  PMS2). This tumor showed macrosatellite instability by PCR. Additional immunohistochemical studies can be performed to identify which mismatched repair gene (MLH1, MSH2, MSH6, or PMS2) is involved in the microsatellite unstable neoplasm if clinically indicated. Identification of this subset of adenocarcinomas is important, as published data suggests that sporadic colorectal adenocarcinomas with MSI have a significantly better prognosis compared with those with intact MMR enzymes. In addition, several studies have suggested that microsatellite unstable adenocarcinomas may be resistant to 5-FU based chemotherapy." JDP/eps 08/20/10 Jimmy Picket MD Pathologist, Electronic Signature (Case signed 08/20/2010)   ASSESSMENT:  1. Supra therapeutic INR in the setting of anticoagulation for PE with Lovenox, without Coumadin usage. 2. ? Vitamin K Deficiency 3. Partial gastrectomy possibly playing a role in #2 4. PE, on anticoagulation with Lovenox 60 mg daily (1 mg/kg daily), diagnosed in October 2012 will require 6 months of anticoagulation. 5. Hematemesis in setting of Lovenox anticoagulation and supra therapeutic INR. 6. Stage IIIB colon cancer, status post resection and status post 7/12 cycles of FOLFOX chemotherapy. This was discontinued due to repeated complications despite dose reductions.  7. History of alcohol abuse status post detox in January 2012. Abstinent since then.  8. Abdominal pain secondary to multiple abdominal surgeries creating adhesions and scarring, much improved.  9. History  of pulmonary embolism diagnosed in December of 2011 and again in October 2012.  10. Iron deficiency anemia, status post Feraheme infusion as an inpatient in September of 2012.  11. Concern about narcotic use. Will monitor closely. Recommend only one physician prescribing pain medication.  PLAN:  1. Coumadin level.  If elevated then we must consider Coumadin abuse.  If normal, not helpful in light of  receiving Vitamin K on 04/14/11 at 1713 hours. 2. Recommend continued monitoring of INR. 3. Recommend administration of Vit K as deemed fit to lower INR.  4. Agree with EGD plan 5. Recommend anticoagulation with LMWH (Lovenox) when deemed fit at 50 mg SQ daily (80% dose reduction). 6. Do not recommend IVC filter at this time without evidence of another embolus.  7. Will continue to follow while an inpatient 8. Will compose letter for disability claim with the assistance of Dr. Mariel Sleet.  All questions were answered. The patient knows to call the clinic with any problems, questions or concerns. We can certainly see the patient much sooner if necessary.  The patient and plan discussed with Glenford Peers, MD and he is in agreement with the aforementioned.  Morrisa Aldaba

## 2011-04-16 ENCOUNTER — Encounter (HOSPITAL_COMMUNITY)
Admission: EM | Disposition: A | Discharge status: Home / Self Care | Payer: Self-pay | Source: Home / Self Care | Attending: Internal Medicine

## 2011-04-16 ENCOUNTER — Inpatient Hospital Stay (HOSPITAL_COMMUNITY): Payer: Federal, State, Local not specified - PPO

## 2011-04-16 ENCOUNTER — Encounter (HOSPITAL_COMMUNITY): Payer: Self-pay | Admitting: *Deleted

## 2011-04-16 HISTORY — PX: ESOPHAGOGASTRODUODENOSCOPY: SHX5428

## 2011-04-16 LAB — PROTIME-INR
INR: 1.45 (ref 0.00–1.49)
Prothrombin Time: 17.9 seconds — ABNORMAL HIGH (ref 11.6–15.2)

## 2011-04-16 SURGERY — EGD (ESOPHAGOGASTRODUODENOSCOPY)
Anesthesia: Moderate Sedation

## 2011-04-16 MED ORDER — IOHEXOL 300 MG/ML  SOLN
125.0000 mL | Freq: Once | INTRAMUSCULAR | Status: AC | PRN
  Administered 2011-04-16: 125 mL via INTRAVENOUS

## 2011-04-16 MED ORDER — SODIUM CHLORIDE 0.9 % IJ SOLN
INTRAMUSCULAR | Status: AC
  Administered 2011-04-16: 10 mL
  Filled 2011-04-16: qty 3

## 2011-04-16 MED ORDER — STERILE WATER FOR IRRIGATION IR SOLN
Status: DC | PRN
  Administered 2011-04-16: 12:00:00

## 2011-04-16 MED ORDER — ENOXAPARIN SODIUM 60 MG/0.6ML ~~LOC~~ SOLN
50.0000 mg | SUBCUTANEOUS | Status: DC
  Administered 2011-04-16: 50 mg via SUBCUTANEOUS
  Filled 2011-04-16: qty 0.6

## 2011-04-16 MED ORDER — SODIUM CHLORIDE 0.45 % IV SOLN
Freq: Once | INTRAVENOUS | Status: AC
  Administered 2011-04-16: 12:00:00 via INTRAVENOUS

## 2011-04-16 MED ORDER — MIDAZOLAM HCL 5 MG/5ML IJ SOLN
INTRAMUSCULAR | Status: AC
  Filled 2011-04-16: qty 10

## 2011-04-16 MED ORDER — SODIUM CHLORIDE 0.9 % IJ SOLN
INTRAMUSCULAR | Status: AC
  Administered 2011-04-16: 20 mL
  Filled 2011-04-16: qty 6

## 2011-04-16 MED ORDER — SODIUM CHLORIDE 0.9 % IJ SOLN
INTRAMUSCULAR | Status: AC
  Administered 2011-04-16: 3 mL
  Filled 2011-04-16: qty 3

## 2011-04-16 MED ORDER — SODIUM CHLORIDE 0.9 % IJ SOLN
INTRAMUSCULAR | Status: AC
  Administered 2011-04-16: 11:00:00
  Filled 2011-04-16: qty 6

## 2011-04-16 MED ORDER — MEPERIDINE HCL 100 MG/ML IJ SOLN
INTRAMUSCULAR | Status: DC | PRN
  Administered 2011-04-16: 25 mg
  Administered 2011-04-16: 50 mg

## 2011-04-16 MED ORDER — MEPERIDINE HCL 100 MG/ML IJ SOLN
INTRAMUSCULAR | Status: AC
  Filled 2011-04-16: qty 1

## 2011-04-16 MED ORDER — MIDAZOLAM HCL 5 MG/5ML IJ SOLN
INTRAMUSCULAR | Status: DC | PRN
  Administered 2011-04-16 (×2): 1 mg via INTRAVENOUS
  Administered 2011-04-16: 2 mg via INTRAVENOUS

## 2011-04-16 NOTE — Op Note (Signed)
[facility] [address] [place], [place]  [address]  ENDOSCOPY PROCEDURE REPORT  PATIENT:  [name], [name]  MR#:  [id number] BIRTHDATE:  1950-04-24, 60 yrs. old  GENDER:  male  ENDOSCOPIST:  R. Roetta Sessions, MD Caleen Essex Referred by:  Glenford Peers, M.D.  PROCEDURE DATE:  04/16/2011 PROCEDURE:  EGD-diagnostic  INDICATIONS:  Hematemesis in the setting of over anticoagulation. Patient has remained stable overnight. Hemoglobin today 12.8; yesterday 12.5  INFORMED CONSENT:   The risks, benefits, limitations, alternatives and imponderables have been discussed.  The potential for biopsy, esophogeal dilation, etc. have also been reviewed.  Questions have been answered.  All parties agreeable.  Please see the history and physical in the medical record for more information.  MEDICATIONS:       Versed 4 mg IV and Demerol 75 mg IV in divided doses. Cetacaine spray.  DESCRIPTION OF PROCEDURE:   The EG-[id number] ([id number]) endoscope was introduced through the mouth and advanced to the second portion of the duodenum without difficulty or limitations.  The mucosal surfaces were surveyed very carefully during advancement of the scope and upon withdrawal.  Retroflexion view of the proximal stomach and esophagogastric junction was performed.  <<PROCEDUREIMAGES>>  FINDINGS:  Excoriation friability at the GE junction; otherwise, normal tubular esophagus. Residual gastric cavity empty. Small hiatal     hernia. Friability the gastric mucosa at the anastomosis. A single anastomosis identified - appeared otherwise normal.     This was the efferent limb. It was intubated 10-15 cm. Subjectively, this segment of the small bowel is         dilated, but otherwis, the mucosa appeared normal.  THERAPEUTIC / DIAGNOSTIC MANEUVERS PERFORMED:   None  COMPLICATIONS:   None  IMPRESSION:  Excoriation at the GE junction consistent with trauma (Mallory-Weiss tear equivalent). Small hiatal hernia.  Probable gastric                  anastomosis ;  dilated efferent limb.  RECOMMENDATIONS:   As discussed with the radiologist yesterday, will proceed with CT enterography to evaluate the abnormal small bowel                                                      seen on CT previously in the setting of persisting post-operative abdominal pain, nausea and vomiting.  Continue PPI.  Okay to resume anticoagulation within therapeutic range. Dr. Darrick Penna will see him over the weekend.  ______________________________ R. Roetta Sessions, MD Caleen Essex  CC:  n. eSIGNED:   R. Roetta Sessions at 04/16/2011 12:26 PM  Sherrlyn Hock, [phone]

## 2011-04-16 NOTE — Progress Notes (Signed)
Subjective: No complaints today, no vomiting  Objective: Vital signs in last 24 hours: Temp:  [97.1 F (36.2 C)-98.8 F (37.1 C)] 98.5 F (36.9 C) (01/25 1134) Pulse Rate:  [72-94] 72  (01/25 1134) Resp:  [16-18] 16  (01/25 1134) BP: (115-132)/(70-88) 128/80 mmHg (01/25 1134) SpO2:  [94 %-96 %] 96 % (01/25 1134) Weight:  [60.782 kg (134 lb)] 60.782 kg (134 lb) (01/25 1134) Weight change:  Last BM Date: 04/12/11  Intake/Output from previous day: 01/24 0701 - 01/25 0700 In: 240 [P.O.:240] Out: 750 [Urine:750] Total I/O In: 0  Out: 200 [Urine:200]   Physical Exam: General: Alert, awake, oriented x3, in no acute distress. HEENT: No bruits, no goiter. Heart: Regular rate and rhythm, without murmurs, rubs, gallops. Lungs: Clear to auscultation bilaterally. Abdomen: Soft, nontender, nondistended, positive bowel sounds. Extremities: No clubbing cyanosis or edema with positive pedal pulses. Neuro: Grossly intact, nonfocal.    Lab Results: Basic Metabolic Panel:  Basename 04/15/11 0449 04/14/11 1210  NA 140 137  K 4.2 4.0  CL 104 100  CO2 29 27  GLUCOSE 96 85  BUN 5* 9  CREATININE 0.78 0.67  CALCIUM 9.1 9.3  MG -- --  PHOS -- --   Liver Function Tests:  Basename 04/15/11 0449 04/14/11 1427  AST 23 17  ALT 16 14  ALKPHOS 90 86  BILITOT 0.2* 0.1*  PROT 6.0 5.9*  ALBUMIN 3.1* 3.2*    Basename 04/14/11 1245  LIPASE 22  AMYLASE --   No results found for this basename: AMMONIA:2 in the last 72 hours CBC:  Basename 04/16/11 0445 04/15/11 0449 04/14/11 1210  WBC -- 8.1 12.2*  NEUTROABS -- -- 8.9*  HGB 12.8* 12.4* --  HCT 38.8* 37.0* --  MCV -- 95.1 94.0  PLT -- 241 255   Cardiac Enzymes: No results found for this basename: CKTOTAL:3,CKMB:3,CKMBINDEX:3,TROPONINI:3 in the last 72 hours BNP: No results found for this basename: PROBNP:3 in the last 72 hours D-Dimer: No results found for this basename: DDIMER:2 in the last 72 hours CBG: No results found  for this basename: GLUCAP:6 in the last 72 hours Hemoglobin A1C: No results found for this basename: HGBA1C in the last 72 hours Fasting Lipid Panel: No results found for this basename: CHOL,HDL,LDLCALC,TRIG,CHOLHDL,LDLDIRECT in the last 72 hours Thyroid Function Tests: No results found for this basename: TSH,T4TOTAL,FREET4,T3FREE,THYROIDAB in the last 72 hours Anemia Panel:  Basename 04/14/11 1428  VITAMINB12 524  FOLATE --  FERRITIN --  TIBC --  IRON --  RETICCTPCT --   Coagulation:  Basename 04/16/11 0445 04/15/11 0449  LABPROT 17.9* 30.4*  INR 1.45 2.85*   Urine Drug Screen: Drugs of Abuse     Component Value Date/Time   LABOPIA NONE DETECTED 04/09/2010 2319   COCAINSCRNUR NONE DETECTED 04/09/2010 2319   LABBENZ POSITIVE* 04/09/2010 2319   AMPHETMU NONE DETECTED 04/09/2010 2319   THCU NONE DETECTED 04/09/2010 2319   LABBARB  Value: NONE DETECTED        DRUG SCREEN FOR MEDICAL PURPOSES ONLY.  IF CONFIRMATION IS NEEDED FOR ANY PURPOSE, NOTIFY LAB WITHIN 5 DAYS.        LOWEST DETECTABLE LIMITS FOR URINE DRUG SCREEN Drug Class       Cutoff (ng/mL) Amphetamine      1000 Barbiturate      200 Benzodiazepine   200 Tricyclics       300 Opiates          300 Cocaine  300 THC              50 04/09/2010 2319    Alcohol Level: No results found for this basename: ETH:2 in the last 72 hours  No results found for this or any previous visit (from the past 240 hour(s)).  Studies/Results: No results found.  Medications: Scheduled Meds:   . sodium chloride   Intravenous Once  . feeding supplement  237 mL Oral BID BM  . folic acid  1 mg Oral Daily  . olmesartan  10 mg Oral Daily   And  . hydrochlorothiazide  12.5 mg Oral Daily  . pantoprazole (PROTONIX) IV  40 mg Intravenous Q12H  . phytonadione  10 mg Oral Q24H  . sertraline  50 mg Oral QHS  . sodium chloride      . sodium chloride      . sodium chloride      . sodium chloride      . thiamine  100 mg Oral Daily    Continuous Infusions:  PRN Meds:.acetaminophen, acetaminophen, LORazepam, morphine, ondansetron (ZOFRAN) IV, ondansetron, traMADol  Assessment/Plan:  Principal Problem:  *Hematemesis Active Problems:  Supratherapeutic INR  Personal history of PE (pulmonary embolism)  Chronic abdominal pain  Plan:  Hgb stable, plan for EGD today, possible CT abd/pelvis to evaluate small bowel afterwards if indicated INR improved, will d/c vit k, coumadin level pending Restart lovenox post EGD if no active bleeding   LOS: 2 days   Praise Stennett [facility] 04/16/2011, 11:47 AM

## 2011-04-17 DIAGNOSIS — K920 Hematemesis: Secondary | ICD-10-CM

## 2011-04-17 LAB — BASIC METABOLIC PANEL
CO2: 30 mEq/L (ref 19–32)
Chloride: 102 mEq/L (ref 96–112)
Glucose, Bld: 90 mg/dL (ref 70–99)
Potassium: 4.3 mEq/L (ref 3.5–5.1)
Sodium: 139 mEq/L (ref 135–145)

## 2011-04-17 LAB — CBC
Hemoglobin: 12.9 g/dL — ABNORMAL LOW (ref 13.0–17.0)
RBC: 4.07 MIL/uL — ABNORMAL LOW (ref 4.22–5.81)
WBC: 10.1 10*3/uL (ref 4.0–10.5)

## 2011-04-17 LAB — PROTIME-INR: INR: 0.99 (ref 0.00–1.49)

## 2011-04-17 MED ORDER — ENOXAPARIN SODIUM 100 MG/ML ~~LOC~~ SOLN: 50.0000 mg | SUBCUTANEOUS | Status: DC

## 2011-04-17 MED ORDER — SODIUM CHLORIDE 0.9 % IJ SOLN
INTRAMUSCULAR | Status: AC
  Administered 2011-04-17: 10 mL
  Filled 2011-04-17: qty 3

## 2011-04-17 MED ORDER — HEPARIN SOD (PORK) LOCK FLUSH 100 UNIT/ML IV SOLN
500.0000 [IU] | INTRAVENOUS | Status: AC | PRN
  Administered 2011-04-17: 500 [IU]
  Filled 2011-04-17: qty 5

## 2011-04-17 NOTE — Progress Notes (Signed)
Discharge instructions reviewed with patient.  Verbalized understanding.  Pt dc'd to home.  Schonewitz, Candelaria Stagers 04/17/2011

## 2011-04-17 NOTE — Progress Notes (Signed)
Subjective: PT TOLERATING POS. C/O MODERATE PERIUMBILICAL ABDOMINAL PAIN. NO HEMATEMESIS.  Objective: Vital signs in last 24 hours: Temp:  [96.7 F (35.9 C)-98.1 F (36.7 C)] 98.1 F (36.7 C) (01/26 0529) Pulse Rate:  [80-105] 80  (01/26 0529) Resp:  [16-18] 16  (01/26 0529) BP: (89-106)/(54-71) 95/61 mmHg (01/26 0659) SpO2:  [90 %-99 %] 95 % (01/26 0529) Last BM Date: 04/16/11  Intake/Output from previous day: 01/25 0701 - 01/26 0700 In: 463 [P.O.:440; I.V.:23] Out: 525 [Urine:525] Intake/Output this shift: Total I/O In: 360 [P.O.:360] Out: -   General appearance: appears older than stated age and no distress Resp: clear to auscultation bilaterally Cardio: regular rate and rhythm GI: soft, MILD TTP IN THE PERIUMBILICAL REGION; bowel sounds normal; no masses,  no organomegaly  Lab Results:  [facility] 04/17/11 0643 04/16/11 0445 04/15/11 0449  WBC 10.1 -- 8.1  HGB 12.9* 12.8* 12.4*  HCT 39.0 38.8* 37.0*  PLT 212 -- 241   BMET  Basename 04/17/11 0643 04/15/11 0449  NA 139 140  K 4.3 4.2  CL 102 104  CO2 30 29  GLUCOSE 90 96  BUN 10 5*  CREATININE 0.84 0.78  CALCIUM 8.8 9.1   LFT  Basename 04/15/11 0449 04/14/11 1427  PROT 6.0 --  ALBUMIN 3.1* --  AST 23 --  ALT 16 --  ALKPHOS 90 --  BILITOT 0.2* --  BILIDIR -- <0.1  IBILI -- NOT CALCULATED   PT/INR  Basename 04/17/11 0643 04/16/11 0445  LABPROT 13.3 17.9*  INR 0.99 1.45   Hepatitis Panel No results found for this basename: HEPBSAG,HCVAB,HEPAIGM,HEPBIGM in the last 72 hours C-Diff No results found for this basename: CDIFFTOX:3 in the last 72 hours Fecal Lactopherrin No results found for this basename: FECLLACTOFRN in the last 72 hours  Studies/Results: Ct Entero Abd/pelvis W/cm  04/16/2011  *RADIOLOGY REPORT*  Clinical Data:  History of malrotated small bowel.  Dilated small bowel.  Nausea and vomiting.  History of colon cancer.  CT ABDOMEN AND PELVIS WITH CONTRAST (CT ENTEROGRAPHY)  Technique:   Multidetector CT of the abdomen and pelvis during bolus administration of intravenous contrast. Negative oral contrast VoLumen was given.  Contrast: OMNIPAQUE IOHEXOL 300 MG/ML IV SOLN  Comparison:  Plain films of the 12/04/2010.  Most recent abdominal CT of 12/01/2010.  Findings: Mild bibasilar dependent atelectasis. Normal heart size without pericardial or pleural effusion.  Small hiatal hernia with decreased mild esophageal wall thickening.  Mild hepatic steatosis.  Too small to characterize left hepatic lobe lesion on image 20 was similar on the prior exam.  Normal spleen.  Surgical sutures within the gastric body.  The proximal duodenum is mildly prominent, with normal caliber of the transverse duodenum.  No obstructive cause identified.  Normal pancreas, without pancreatic ductal dilatation.  Status post cholecystectomy. Common duct is upper normal for prior cholecystectomy state, 11 mm. No obstructive cause identified.  Normal adrenal glands.  Right renal cysts and too small to characterize lesions.  Normal left kidney.  Accessory upper pole right renal artery.  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 No retroperitoneal or retrocrural adenopathy.  Surgical sutures are identified within the hepatic flexure of the colon.  The colon is normal in caliber.  Moderate amount of stool within.  The ascending colon appears thick-walled, including on image 50.  This is favored to be due to underdistension.  Normal terminal ileum.  The extent of small bowel distention with neutral contrast is moderate.  Despite the  history submitted, the duodenal jejunal junction appears normal in position, with the transverse duodenum crossing the midline.  Mild small bowel wall thickening is suspected on the left upper quadrant.  Including on image 35 of series 3.  Inferior to this, mid small bowel loops measure up to 3.7 cm in the left lower quadrant, mildly dilated. No focal transition is identified.  No  pneumatosis or free intraperitoneal air.  Distal small bowel loops are normal to decompressed.  No focal transition point is identified.  No mucosal hyperenhancement.  No mesenteric abnormality.  Extensive surgical clips in the small bowel mesentery. No pelvic adenopathy.    Normal urinary bladder and prostate.  No significant free fluid.  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  2.0 cm on image 82 of series 2.  Similar back to 12/31/2009.  IMPRESSION:  1.  No evidence of small bowel malrotation.  The duodenal jejunal junction appears normal in position. 2.  Proximal to mid small bowel dilatation, mild, 3.7 cm.  Normal caliber distal small bowel with no focal transition point identified.  Question mild adynamic ileus and/or postoperative atony. 3.  Suspicion of mild small bowel wall thickening in the left upper quadrant.  Cannot exclude mild enteritis. 4.  Apparent ascending colonic wall thickening, favored to be due to underdistension.  Correlate with any symptoms of colitis. 5.  Hepatic steatosis. 6.  No evidence of metastatic disease.  Too small to characterize liver lesion is unchanged. 7. Possible constipation.  Original Report Authenticated By: Consuello Bossier, M.D.    Medications: I have reviewed the patient's current medications.  Assessment/Plan: ADMITTED WITH HEMATEMESIS LILEY IN SETTING OF ANTICOAGULATION & VIRAL ILLNESS. sX IMPROVED. HB STABLE. CTE SHOWS ENTERITIS.  PLAN: 1. OK TO D/C TO HOME 2. FOLLOW UP WITH DR. Jena Gauss BEING ARRANGED.   LOS: 3 days   [facility] 04/17/2011, 12:10 PM

## 2011-04-17 NOTE — Discharge Summary (Signed)
Physician Discharge Summary  [name] MRN: [id number] DOB/AGE: [birth year] 61 y.o.  Admit date: 04/14/2011 Discharge date: 04/17/2011  Primary Care Physician:  [name], MD, MD   Discharge Diagnoses:    Principal Problem:  *Hematemesis due to Excoriation at the GE junction consistent with trauma Active Problems: Vomiting likely due to gastroenteritis, improved  Supratherapeutic INR, possibly due to vit K deficiency/malnutrition vs. Coumadin use, coumadin level is pending  Personal history of PE (pulmonary embolism), on long term lovenox therapy due to bleeding issues with coumadin  Chronic abdominal pain on chronic pain management. History of Colon Cancer, Follow up oncology HTN    Medication List  As of 04/17/2011  1:31 PM   STOP taking these medications         valsartan-hydrochlorothiazide 80-12.5 MG per tablet         TAKE these medications         CARAFATE 1 GM/10ML suspension   Generic drug: sucralfate   Take 15 mg by mouth 4 times daily.      cyanocobalamin 1000 MCG/ML injection   Commonly known as: (VITAMIN B-12)   Inject 1,000 mcg into the muscle every 30 (thirty) days.      dexlansoprazole 60 MG capsule   Commonly known as: DEXILANT   Take 1 capsule (60 mg total) by mouth daily.      enoxaparin 100 MG/ML Soln   Commonly known as: LOVENOX   Inject 0.5 mLs (50 mg total) into the skin daily.      feeding supplement Liqd   Take 237 mLs by mouth 2 (two) times daily between meals.      folic acid 1 MG tablet   Commonly known as: FOLVITE   Take 1 mg by mouth daily.      LORazepam 1 MG tablet   Commonly known as: ATIVAN   Take 1 mg by mouth every 4 (four) hours as needed. Nausea and Vomiting      prochlorperazine 10 MG tablet   Commonly known as: COMPAZINE   TAKE ONE TABLET BY MOUTH EVERY 6 HOURS AS NEEDED FOR NAUSEA      sertraline 50 MG tablet   Commonly known as: ZOLOFT   Take 50 mg by mouth at bedtime.      thiamine 100  MG tablet   Take 100 mg by mouth daily.      traMADol 50 MG tablet   Commonly known as: ULTRAM   Take 50 mg by mouth 4 (four) times daily as needed. For pain           Discharge Exam: Blood pressure 95/61, pulse 80, temperature 98.1 F (36.7 C), temperature source Oral, resp. rate 16, height 5\' 9"  (1.753 m), weight 60.782 kg (134 lb), SpO2 95.00%. NAD CTA B S1, S2 RRR Soft, mild tenderness in periumbilical region No edema b/l  Disposition and Follow-up:  Follow up with primary doctor in 2 weeks Follow up with Dr. Jena Gauss to be arranged by GI Follow up with Dr. Mariel Sleet as scheduled.  Consults: Gastroenterology, Dr. Jena Gauss Hematology, Dr. Mariel Sleet   Significant Diagnostic Studies:  No results found.  Brief H and P: For complete details please refer to admission H and P, but in brief This is a 61 year old gentleman with multiple medical problems who was in his usual state of health up until yesterday. This morning he reports waking up and going to the bathroom. He felt nauseous and felt like he needed to throw up. He reported  having a large amount of bright red blood in his vomit. He had a second episode which had some streaking of blood. He reported to the emergency room for evaluation where he reports having another episode in the emergency room was a small amount of blood. Patient reports having a similar episode in the past. He's had upper endoscopy in the past which has revealed erosive esophagitis. Patient is also on anticoagulation. He was recently diagnosed with a pulmonary embolism at the end of October 2012. At that time 6 months of anticoagulation was recommended. He was continued on Lovenox for anticoagulation since he has had trouble with bleeding with Coumadin. He adamantly says that he has not been taking any Coumadin. I also called his pharmacy he reports the last time it was filled was in June. He is sure that his previous prescription of Coumadin has not mixed in with  his current pills. In evaluation in the emergency room he was found to have an elevated INR of 4. Liver function tests were within normal limits. He does not appear toxic. He denies any fever but describes often having night sweats. He has a chronic cough which is productive of small amount of phlegm. He is still smoking. He has chronic abdominal pain which has been extensively evaluated in the past. He reports feeling dizzy on standing. Has some shortness of breath with exertion. The patient has been referred for admission.     Hospital Course:  Patient was initially admitted to the hospital with reported hematemesis.  Patient was anticoagulated for recent PE with lovenox.  Interestingly his INR was found to be elevated at 4. He denied taking any coumadin.    He was followed by the Gastroenterologists and underwent endoscopy.  Results of which showed excoriation at GE junction equivalent to mallory weiss tear. His hemoglobin has remained stable, and he has not required a transfusion.  His diet has been advanced and he has been cleared to resume his anticoagulation.  A CT of the abd was also checked which showed some enteritis but no other significant findings. Case was discussed with GI and it was felt that the patient could discharge home today.  Regarding his INR, he was given Vit K to help reverse this.  He was followed by hematology, and that's were that patient was either vit k deficient due to malnourishment or that he had been taking coumadin.  A coumadin level was sent and unfortunately has not returned yet.  His INR has corrected, and he is continued on lovenox for recent PE (diagnosed in 10/12)  He will need 3 more months of anticoagulation and will be followed by the hem/onc clinic.  His blood pressure had been normotensive to slightly on the lower side. We will hold his antihypertensives for now until he is re assessed by his primary doctor.  Time spent on  Discharge:  Signed: Charlen Bakula [facility] 04/17/2011, 1:31 PM

## 2011-04-22 ENCOUNTER — Encounter (HOSPITAL_COMMUNITY): Payer: Self-pay | Admitting: Internal Medicine

## 2011-04-30 ENCOUNTER — Other Ambulatory Visit (HOSPITAL_COMMUNITY): Payer: Self-pay | Admitting: Oncology

## 2011-04-30 LAB — MISCELLANEOUS TEST

## 2011-05-03 ENCOUNTER — Other Ambulatory Visit (HOSPITAL_COMMUNITY): Payer: Self-pay | Admitting: Oncology

## 2011-05-03 NOTE — Progress Notes (Signed)
On the patient's last admission, he was noted to have a supratherapeutic INR without being on Coumadin.  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  He was on Lovenox for DVT/PE.  He reported that he was not taking any Coumadin by accident.   A coumadin level was drawn after he was administered Vitamin K and there was coumadin detected.    I have called his Pharmacy, [facility] in [place].  His last refill of Coumadin was on 09/18/2010, but he had some refills left.  As a result, these refills were cancelled.   I spoke with Kirt Boys at the [facility] today at 1740 hours.   Dagoberto Nealy

## 2011-05-05 NOTE — Telephone Encounter (Signed)
Did well

## 2011-05-09 ENCOUNTER — Emergency Department (HOSPITAL_COMMUNITY): Payer: Federal, State, Local not specified - PPO

## 2011-05-09 ENCOUNTER — Emergency Department (HOSPITAL_COMMUNITY)
Admission: EM | Admit: 2011-05-09 | Discharge: 2011-05-09 | Disposition: A | Discharge status: Home / Self Care | Payer: Federal, State, Local not specified - PPO | Attending: Emergency Medicine | Admitting: Emergency Medicine

## 2011-05-09 ENCOUNTER — Other Ambulatory Visit: Payer: Self-pay

## 2011-05-09 ENCOUNTER — Encounter (HOSPITAL_COMMUNITY): Payer: Self-pay | Admitting: *Deleted

## 2011-05-09 DIAGNOSIS — Z8701 Personal history of pneumonia (recurrent): Secondary | ICD-10-CM | POA: Insufficient documentation

## 2011-05-09 DIAGNOSIS — R0602 Shortness of breath: Secondary | ICD-10-CM | POA: Insufficient documentation

## 2011-05-09 DIAGNOSIS — J40 Bronchitis, not specified as acute or chronic: Secondary | ICD-10-CM | POA: Insufficient documentation

## 2011-05-09 DIAGNOSIS — Z8673 Personal history of transient ischemic attack (TIA), and cerebral infarction without residual deficits: Secondary | ICD-10-CM | POA: Insufficient documentation

## 2011-05-09 DIAGNOSIS — F17200 Nicotine dependence, unspecified, uncomplicated: Secondary | ICD-10-CM | POA: Insufficient documentation

## 2011-05-09 DIAGNOSIS — Z85038 Personal history of other malignant neoplasm of large intestine: Secondary | ICD-10-CM | POA: Insufficient documentation

## 2011-05-09 DIAGNOSIS — F1021 Alcohol dependence, in remission: Secondary | ICD-10-CM | POA: Insufficient documentation

## 2011-05-09 DIAGNOSIS — R0600 Dyspnea, unspecified: Secondary | ICD-10-CM

## 2011-05-09 DIAGNOSIS — R079 Chest pain, unspecified: Secondary | ICD-10-CM | POA: Insufficient documentation

## 2011-05-09 DIAGNOSIS — Z86711 Personal history of pulmonary embolism: Secondary | ICD-10-CM | POA: Insufficient documentation

## 2011-05-09 DIAGNOSIS — I10 Essential (primary) hypertension: Secondary | ICD-10-CM | POA: Insufficient documentation

## 2011-05-09 DIAGNOSIS — Z9889 Other specified postprocedural states: Secondary | ICD-10-CM | POA: Insufficient documentation

## 2011-05-09 DIAGNOSIS — R0609 Other forms of dyspnea: Secondary | ICD-10-CM | POA: Insufficient documentation

## 2011-05-09 DIAGNOSIS — R011 Cardiac murmur, unspecified: Secondary | ICD-10-CM | POA: Insufficient documentation

## 2011-05-09 DIAGNOSIS — R0989 Other specified symptoms and signs involving the circulatory and respiratory systems: Secondary | ICD-10-CM | POA: Insufficient documentation

## 2011-05-09 LAB — CBC
HCT: 36.6 % — ABNORMAL LOW (ref 39.0–52.0)
Hemoglobin: 12.3 g/dL — ABNORMAL LOW (ref 13.0–17.0)
WBC: 7.6 10*3/uL (ref 4.0–10.5)

## 2011-05-09 LAB — BASIC METABOLIC PANEL
CO2: 25 mEq/L (ref 19–32)
Chloride: 105 mEq/L (ref 96–112)
GFR calc Af Amer: >90 mL/min (ref >90)
Sodium: 138 mEq/L (ref 135–145)

## 2011-05-09 LAB — POCT I-STAT TROPONIN I: Troponin i, poc: 0 ng/mL (ref 0.00–0.08)

## 2011-05-09 LAB — APTT: aPTT: 35 seconds (ref 24–37)

## 2011-05-09 LAB — PRO B NATRIURETIC PEPTIDE: Pro B Natriuretic peptide (BNP): 144.5 pg/mL — ABNORMAL HIGH (ref 0–125)

## 2011-05-09 LAB — D-DIMER, QUANTITATIVE: D-Dimer, Quant: 0.77 ug/mL-FEU — ABNORMAL HIGH (ref 0.00–0.48)

## 2011-05-09 MED ORDER — LORAZEPAM 1 MG PO TABS: 1.0000 mg | ORAL_TABLET | Freq: Three times a day (TID) | ORAL | Status: AC | PRN

## 2011-05-09 MED ORDER — CEPHALEXIN 500 MG PO CAPS: 500.0000 mg | ORAL_CAPSULE | Freq: Three times a day (TID) | ORAL | Status: DC

## 2011-05-09 MED ORDER — MORPHINE SULFATE 4 MG/ML IJ SOLN
4.0000 mg | Freq: Once | INTRAMUSCULAR | Status: AC
  Administered 2011-05-09: 4 mg via INTRAVENOUS
  Filled 2011-05-09: qty 1

## 2011-05-09 MED ORDER — ONDANSETRON HCL 4 MG/2ML IJ SOLN
4.0000 mg | Freq: Once | INTRAMUSCULAR | Status: AC
  Administered 2011-05-09: 4 mg via INTRAVENOUS
  Filled 2011-05-09: qty 2

## 2011-05-09 MED ORDER — IOHEXOL 350 MG/ML SOLN
100.0000 mL | Freq: Once | INTRAVENOUS | Status: AC | PRN
  Administered 2011-05-09: 100 mL via INTRAVENOUS

## 2011-05-09 NOTE — ED Provider Notes (Cosign Needed)
History     CSN: [id number]  Arrival date & time 05/09/11  1250   First MD Initiated Contact with Patient 05/09/11 1304      Chief Complaint  Patient presents with  . Chest Pain    (Consider location/radiation/quality/duration/timing/severity/associated sxs/prior treatment) HPI  Patient relates he started getting chest pain about 10:30 while sitting in church. He indicates the left lateral chest wall with small amount of pressure. He relates he's had a cough for the past week that is dry he is unaware of fever but states she's had some chills and he feels very short of breath with exertional shortness of breath he states the shortness of breath has been constant. He denies nausea or vomiting he states he did have some sweating last night. He states he's concerned because he had similar symptoms when he had a PE pulmonary embolus last time was in October and also with pneumonia.. States he also had one in December of 2011.  Patient relates he had a cardiac cath about 3 years ago that was normal  PCP Dr. Ignacia Palma in [facility]  Past Medical History  Diagnosis Date  . 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 Pulmonary embolism 02/2010  . Acid reflux   . Hypertension   . Osteoporosis   . Arthritis   . TIA (transient ischemic attack) 10/11  . ETOH abuse     quit 03/2010  . 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 Personal history of PE (pulmonary embolism) 10/01/2010  . Blood transfusion   . S/P endoscopy September 28, 2010    erosive reflux esophagitis, [place] I anatomy  . Angina   . Shortness of breath   . Sleep apnea   . Recurrent upper respiratory infection (URI)   . Anxiety   . Pneumonia   . Anemia     Past Surgical History  Procedure Date  . Hernia repair     right inguinal  . Appendectomy 1980s  . Cholecystectomy 1980s  . Colon surgery May 2012    left hemicolectomy  . Portacath placement   . Abdominal sugery     for bowel obstruction x  8  . Esophagogastroduodenoscopy 09/28/2010  . Esophagogastroduodenoscopy 12/01/2010    Cervical web status post dilation, erosive esophagitis, B1 hemigastrectomy, inflamed anastomosis  . Colonoscopy 03/18/2011    anastomosis at 35cm. Several adenomatous polyps removed. Sigmoid diverticulosis. Next TCS 02/2013  . Esophagogastroduodenoscopy 04/16/2011    Procedure: ESOPHAGOGASTRODUODENOSCOPY (EGD);  Surgeon: Corbin Ade, MD;  Location: [facility];  Service: Endoscopy;  Laterality: N/A;  Hematemsis    Family History  Problem Relation Age of Onset  . Hypertension Mother   . Arthritis Mother   . Hypertension Father   . Colon cancer Neg Hx     History  Substance Use Topics  . Smoking status: Current Everyday Smoker -- 0.5 packs/day for 40 years    Types: Cigarettes  . Smokeless tobacco: Not on file  . Alcohol Use: No     last drink was Apr 16 2010  lives with spouse  unemployed  Review of Systems  Allergies  Review of patient's allergies indicates no known allergies.  Home Medications   Current Outpatient Rx  Name Route Sig Dispense Refill  . CARAFATE 1 GM/10ML PO SUSP Oral Take 1.5 g by mouth 2 (two) times daily.     . CYANOCOBALAMIN 1000 MCG/ML IJ SOLN Intramuscular Inject 1,000 mcg into the muscle every 30 (thirty)  days.    [name] DEXLANSOPRAZOLE 60 MG PO CPDR Oral Take 1 capsule (60 mg total) by mouth daily. 30 capsule 11  . ENOXAPARIN SODIUM 60 MG/0.6ML [place] SOLN Subcutaneous Inject 60 mg into the skin at bedtime.    [name] FOLIC ACID 1 MG PO TABS Oral Take 1 mg by mouth daily.      [name] LORAZEPAM 1 MG PO TABS Oral Take 1 mg by mouth every 4 (four) hours as needed. Nausea and Vomiting    . PROCHLORPERAZINE MALEATE 10 MG PO TABS Oral Take 10 mg by mouth every 6 (six) hours as needed. Nausea and Vomiting    . SERTRALINE HCL 50 MG PO TABS Oral Take 50 mg by mouth at bedtime.     . THIAMINE HCL 100 MG PO TABS Oral Take 100 mg by mouth daily.      . TRAMADOL HCL 50 MG PO TABS Oral Take 50  mg by mouth 4 (four) times daily as needed. For pain    . DIOVAN HCT PO Oral Take 1 tablet by mouth daily.      BP 134/83  Pulse 80  Resp 18  Ht 5\' 9"  (1.753 m)  Wt 140 lb (63.504 kg)  BMI 20.67 kg/m2  SpO2 98%  Physical Exam  Nursing note and vitals reviewed. Constitutional: He is oriented to person, place, and time. He appears well-developed and well-nourished.  Non-toxic appearance. He does not appear ill. No distress.  HENT:  Head: Normocephalic and atraumatic.  Right Ear: External ear normal.  Left Ear: External ear normal.  Nose: Nose normal. No mucosal edema or rhinorrhea.  Mouth/Throat: Oropharynx is clear and moist and mucous membranes are normal. No dental abscesses or uvula swelling.  Eyes: Conjunctivae and EOM are normal. Pupils are equal, round, and reactive to light.  Neck: Normal range of motion and full passive range of motion without pain. Neck supple.  Cardiovascular: Normal rate and regular rhythm.  Exam reveals no gallop and no friction rub.   Murmur heard. Pulmonary/Chest: Effort normal and breath sounds normal. No respiratory distress. He has no wheezes. He has no rhonchi. He has no rales. He exhibits no tenderness and no crepitus.  Abdominal: Soft. Normal appearance and bowel sounds are normal. He exhibits no distension. There is no tenderness. There is no rebound and no guarding.  Musculoskeletal: Normal range of motion. He exhibits no edema and no tenderness.       Moves all extremities well.   Neurological: He is alert and oriented to person, place, and time. He has normal strength. No cranial nerve deficit.  Skin: Skin is warm, dry and intact. No rash noted. No erythema. No pallor.  Psychiatric: His speech is normal and behavior is normal. His mood appears not anxious.       Flat affect    ED Course  Procedures (including critical care time)   Medications  morphine 4 MG/ML injection 4 mg (4 mg Intravenous Given 05/09/11 1411)  ondansetron (ZOFRAN)  injection 4 mg (4 mg Intravenous Given 05/09/11 1410)  iohexol (OMNIPAQUE) 350 MG/ML injection 100 mL (100 mL Intravenous Contrast Given 05/09/11 1439)   Pt and wife given results of CT and they are relieved.  He feels ready to go home.     Results for orders placed during the hospital encounter of 05/09/11  CBC      Component Value Range   WBC 7.6  4.0 - 10.5 (K/uL)   RBC 3.84 (*) 4.22 - 5.81 (MIL/uL)  Hemoglobin 12.3 (*) 13.0 - 17.0 (g/dL)   HCT 16.1 (*) 09.6 - 52.0 (%)   MCV 95.3  78.0 - 100.0 (fL)   MCH 32.0  26.0 - 34.0 (pg)   MCHC 33.6  30.0 - 36.0 (g/dL)   RDW 04.5  40.9 - 81.1 (%)   Platelets 231  150 - 400 (K/uL)  BASIC METABOLIC PANEL      Component Value Range   Sodium 138  135 - 145 (mEq/L)   Potassium 4.1  3.5 - 5.1 (mEq/L)   Chloride 105  96 - 112 (mEq/L)   CO2 25  19 - 32 (mEq/L)   Glucose, Bld 137 (*) 70 - 99 (mg/dL)   BUN 12  6 - 23 (mg/dL)   Creatinine, Ser 9.14  0.50 - 1.35 (mg/dL)   Calcium 9.2  8.4 - 78.2 (mg/dL)   GFR calc non Af Amer >90  >90 (mL/min)   GFR calc Af Amer >90  >90 (mL/min)  PRO B NATRIURETIC PEPTIDE      Component Value Range   Pro B Natriuretic peptide (BNP) 144.5 (*) 0 - 125 (pg/mL)  POCT I-STAT TROPONIN I      Component Value Range   Troponin i, poc 0.00  0.00 - 0.08 (ng/mL)   Comment 3           D-DIMER, QUANTITATIVE      Component Value Range   D-Dimer, Quant 0.77 (*) 0.00 - 0.48 (ug/mL-FEU)  APTT      Component Value Range   aPTT 35  24 - 37 (seconds)  PROTIME-INR      Component Value Range   Prothrombin Time 12.4  11.6 - 15.2 (seconds)   INR 0.91  0.00 - 1.49    Dg Chest 2 View  05/09/2011  *RADIOLOGY REPORT*  Clinical Data: Of cough and shortness of breath  CHEST - 2 VIEW  Comparison: Chest radiograph 03/03/2011  Findings: The left subclavian power port terminates in the proximal superior vena cava, stable.  Borderline cardiomegaly is stable. The pulmonary vascularity is normal.  The lungs are well expanded and clear.   There are no pleural effusions or pneumothorax.  The bones are osteopenic.  No acute bony abnormality is identified. Surgical clips in the upper abdomen.  IMPRESSION: No acute cardiopulmonary disease.  Original Report Authenticated By: Britta Mccreedy, M.D.   Ct Angio Chest W/cm &/or Wo Cm  05/09/2011  *RADIOLOGY REPORT*  Clinical Data: Acute shortness of breath and chest pain.  History of colon cancer and pulmonary embolism.  CT ANGIOGRAPHY CHEST  Technique:  Multidetector CT imaging of the chest using the standard protocol during bolus administration of intravenous contrast. Multiplanar reconstructed images including MIPs were obtained and reviewed to evaluate the vascular anatomy.  Contrast: OMNIPAQUE IOHEXOL 350 MG/ML IV SOLN  Comparison: Chest radiograph 02/17/2013and CT PE study 01/17/2011  Findings: Left chest wall Port-A-Cath is present.  The distal tip of the catheter terminates in the superior vena cava.  Mild cardiomegaly with left ventricular prominence.  Coronary artery atherosclerotic calcification is noted.  The thoracic aorta is normal in caliber and enhancement.  Negative for dissection.  This is satisfactory evaluation of the pulmonary arterial tree. Central pulmonary arteries are normal in caliber. There are no filling defects in the pulmonary arteries to suggest pulmonary embolism. Specifically, the filling defects seen in the right lower lobe pulmonary arteries on the study of 01/17/2011 have resolved. These pulmonary arteries opacify normally on today's examination.  There is  no pleural or pericardial effusion. No lymphadenopathy is identified in the chest.  There is a very thin ostial 1 mm linear filling defect in the right main is 10 bronchus.  This could be a tiny amount of mucus, and was not present on the prior study.  Aside from mild dependent atelectasis., the lungs are clear.  No acute or suspicious bony abnormality.  Mild convex right scoliosis of the upper thoracic spine.   IMPRESSION:  1.  Negative for pulmonary embolism. 2.  Previously identified pulmonary emboli in the right lower lobe on the study of 01/17/2011 have resolved. 2. Question very tiny strand of mucus in the right mainstem bronchus. 3. Coronary artery atherosclerotic calcification and mild of the ventricular prominence.  Original Report Authenticated By: Britta Mccreedy, M.D.   Ct Entero Abd/pelvis W/cm  04/16/2011  *RADIOLOGY REPORT*  Clinical Data:  History of malrotated small bowel.  Dilated small bowel.  Nausea and vomiting.  History of colon cancer.  CT ABDOMEN AND PELVIS WITH CONTRAST (CT ENTEROGRAPHY)  Technique:  Multidetector CT of the abdomen and pelvis during bolus administration of intravenous contrast. Negative oral contrast VoLumen was given.  Contrast: OMNIPAQUE IOHEXOL 300 MG/ML IV SOLN  Comparison:  Plain films of the 12/04/2010.  Most recent abdominal CT of 12/01/2010.  Findings: Mild bibasilar dependent atelectasis. Normal heart size without pericardial or pleural effusion.  Small hiatal hernia with decreased mild esophageal wall thickening.  Mild hepatic steatosis.  Too small to characterize left hepatic lobe lesion on image 20 was similar on the prior exam.  Normal spleen.  Surgical sutures within the gastric body.  The proximal duodenum is mildly prominent, with normal caliber of the transverse duodenum.  No obstructive cause identified.  Normal pancreas, without pancreatic ductal dilatation.  Status post cholecystectomy. Common duct is upper normal for prior cholecystectomy state, 11 mm. No obstructive cause identified.  Normal adrenal glands.  Right renal cysts and too small to characterize lesions.  Normal left kidney.  Accessory upper pole right renal artery.  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 No retroperitoneal or retrocrural adenopathy.  Surgical sutures are identified within the hepatic flexure of the colon.  The colon is normal in caliber.  Moderate amount of  stool within.  The ascending colon appears thick-walled, including on image 50.  This is favored to be due to underdistension.  Normal terminal ileum.  The extent of small bowel distention with neutral contrast is moderate.  Despite the history submitted, the duodenal jejunal junction appears normal in position, with the transverse duodenum crossing the midline.  Mild small bowel wall thickening is suspected on the left upper quadrant.  Including on image 35 of series 3.  Inferior to this, mid small bowel loops measure up to 3.7 cm in the left lower quadrant, mildly dilated. No focal transition is identified.  No pneumatosis or free intraperitoneal air.  Distal small bowel loops are normal to decompressed.  No focal transition point is identified.  No mucosal hyperenhancement.  No mesenteric abnormality.  Extensive surgical clips in the small bowel mesentery. No pelvic adenopathy.    Normal urinary bladder and prostate.  No significant free fluid.  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  2.0 cm on image 82 of series 2.  Similar back to 12/31/2009.  IMPRESSION:  1.  No evidence of small bowel malrotation.  The duodenal jejunal junction appears normal in position. 2.  Proximal to mid small bowel dilatation, mild,  3.7 cm.  Normal caliber distal small bowel with no focal transition point identified.  Question mild adynamic ileus and/or postoperative atony. 3.  Suspicion of mild small bowel wall thickening in the left upper quadrant.  Cannot exclude mild enteritis. 4.  Apparent ascending colonic wall thickening, favored to be due to underdistension.  Correlate with any symptoms of colitis. 5.  Hepatic steatosis. 6.  No evidence of metastatic disease.  Too small to characterize liver lesion is unchanged. 7. Possible constipation.  Original Report Authenticated By: Consuello Bossier, M.D.    Date: 05/09/2011  Rate: 92  Rhythm: normal sinus rhythm  QRS Axis: normal  Intervals: normal   ST/T Wave abnormalities: normal  Conduction Disutrbances:LVH  Narrative Interpretation:   Old EKG Reviewed: unchanged from 03/03/2011     1. Dyspnea   2. Bronchitis    New Prescriptions   CEPHALEXIN (KEFLEX) 500 MG CAPSULE    Take 1 capsule (500 mg total) by mouth 3 (three) times daily.   LORAZEPAM (ATIVAN) 1 MG TABLET    Take 1 tablet (1 mg total) by mouth 3 (three) times daily as needed for anxiety.   Plan discharge Devoria Albe, MD, FACEP    MDM          Ward Givens, MD 05/09/11 1702  Ward Givens, MD 05/09/11 [phone]

## 2011-05-09 NOTE — ED Notes (Signed)
Family at bedside. Patient is comfortable at this time. 

## 2011-05-09 NOTE — ED Notes (Signed)
Pt c/o left sided upper cp. Pt states pain began an hour ago and is non radiating.

## 2011-05-13 ENCOUNTER — Ambulatory Visit (HOSPITAL_COMMUNITY)
Admission: RE | Admit: 2011-05-13 | Discharge: 2011-05-13 | Disposition: A | Discharge status: Home / Self Care | Payer: Federal, State, Local not specified - PPO | Source: Ambulatory Visit | Attending: Gastroenterology | Admitting: Gastroenterology

## 2011-05-13 ENCOUNTER — Ambulatory Visit (INDEPENDENT_AMBULATORY_CARE_PROVIDER_SITE_OTHER): Payer: Federal, State, Local not specified - PPO | Admitting: Gastroenterology

## 2011-05-13 ENCOUNTER — Encounter: Payer: Self-pay | Admitting: Gastroenterology

## 2011-05-13 VITALS — BP 132/78 | HR 114 | Temp 98.2°F | Ht 69.0 in | Wt 145.2 lb

## 2011-05-13 DIAGNOSIS — R109 Unspecified abdominal pain: Secondary | ICD-10-CM

## 2011-05-13 DIAGNOSIS — R1032 Left lower quadrant pain: Secondary | ICD-10-CM | POA: Insufficient documentation

## 2011-05-13 DIAGNOSIS — R1012 Left upper quadrant pain: Secondary | ICD-10-CM | POA: Insufficient documentation

## 2011-05-13 DIAGNOSIS — K449 Diaphragmatic hernia without obstruction or gangrene: Secondary | ICD-10-CM | POA: Insufficient documentation

## 2011-05-13 DIAGNOSIS — K56609 Unspecified intestinal obstruction, unspecified as to partial versus complete obstruction: Secondary | ICD-10-CM | POA: Insufficient documentation

## 2011-05-13 MED ORDER — IOHEXOL 300 MG/ML  SOLN
100.0000 mL | Freq: Once | INTRAMUSCULAR | Status: AC | PRN
  Administered 2011-05-13: 100 mL via INTRAVENOUS

## 2011-05-13 NOTE — Progress Notes (Signed)
Primary Care Physician:  [name], MD, MD  Primary Gastroenterologist:  Roetta Sessions, MD   Chief Complaint  Patient presents with  . Bowel obstruction    HPI:  [name] is a 61 y.o. male here for urgent visit at request of [name] for suspected recurrent SBO. Tuesday night swelling of abdomen, arms, legs. Stomach really tight. Still having BMs. PP n/v, abdominal distention every couple of days since last summer when he presented with bowel obstruction and was found to have colon cancer.  No heartburn. Left mid-abd pain. Some night sweats on Tuesday night. Used to have frequent night sweats.   KUB by PCP today showed mildly prominent bowel loop in left mid abd measuring up to 3.5cm with scattered minimal air fluid level. Also prominence of bowel wall in LUQ. Stool and air noted in rectum.   Used to have recurrent small bowel obstructions in the 1980s. Did well until last year when presented with bowel obstruction. At time of surgery he was found to have colon mass. Stage III colon cancer. Multiple imaging studies since that time with mild small bowel wall thickening in LUQ, mid small bowel loops dilated in the left abdomen.    Current Outpatient Prescriptions  Medication Sig Dispense Refill  . CARAFATE 1 GM/10ML suspension Take 1.5 g by mouth 2 (two) times daily.       . cyanocobalamin (,VITAMIN B-12,) 1000 MCG/ML injection Inject 1,000 mcg into the muscle every 30 (thirty) days.      [name] dexlansoprazole (DEXILANT) 60 MG capsule Take 1 capsule (60 mg total) by mouth daily.  30 capsule  11  . enoxaparin (LOVENOX) 60 MG/0.6ML SOLN Inject 60 mg into the skin at bedtime.      . folic acid (FOLVITE) 1 MG tablet Take 1 mg by mouth daily.        [name] LORazepam (ATIVAN) 1 MG tablet Take 1 tablet (1 mg total) by mouth 3 (three) times daily as needed for anxiety.  5 tablet  0  . prochlorperazine (COMPAZINE) 10 MG tablet Take 10 mg by mouth every 6 (six) hours as needed. Nausea and  Vomiting      . sertraline (ZOLOFT) 50 MG tablet Take 50 mg by mouth at bedtime.       . thiamine 100 MG tablet Take 100 mg by mouth daily.        . traMADol (ULTRAM) 50 MG tablet Take 50 mg by mouth 4 (four) times daily as needed. For pain      . Valsartan-Hydrochlorothiazide (DIOVAN HCT PO) Take 1 tablet by mouth daily.           Allergies as of 05/13/2011  . (No Known Allergies)    Past Medical History  Diagnosis Date  . 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 Pulmonary embolism 02/2010  . Acid reflux   . Hypertension   . Osteoporosis   . Arthritis   . TIA (transient ischemic attack) 10/11  . ETOH abuse     quit 03/2010  . 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 Personal history of PE (pulmonary embolism) 10/01/2010  . Blood transfusion   . S/P endoscopy September 28, 2010    erosive reflux esophagitis, Billroth I anatomy  . Angina   . Shortness of breath   . Sleep apnea   . Recurrent upper respiratory infection (URI)   . Anxiety   . Pneumonia   . Anemia  Past Surgical History  Procedure Date  . Hernia repair     right inguinal  . Appendectomy 1980s  . Cholecystectomy 1980s  . Colon surgery May 2012    left hemicolectomy, colon cancer found at time of surgery for bowel obstruction  . Portacath placement   . Abdominal sugery     for bowel obstruction x 8, all in 1980s, except for one in 07/2010  . Esophagogastroduodenoscopy 09/28/2010  . Esophagogastroduodenoscopy 12/01/2010    Cervical web status post dilation, erosive esophagitis, B1 hemigastrectomy, inflamed anastomosis  . Colonoscopy 03/18/2011    anastomosis at 35cm. Several adenomatous polyps removed. Sigmoid diverticulosis. Next TCS 02/2013  . Esophagogastroduodenoscopy 04/16/2011    excoriation at GEJ c/w trauma/M-W tear, friable gastric anastomosis, dilation efferent limb  . 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 Hypertension Mother   . Arthritis Mother   . Hypertension Father   . Colon cancer Neg Hx     History   Social History  . Marital Status: Married    Spouse Name: N/A    Number of Children: 3  . Years of Education: N/A   Occupational History  .  [employer]   Social History Main Topics  . Smoking status: Current Everyday Smoker -- 0.5 packs/day for 40 years    Types: Cigarettes  . Smokeless tobacco: Not on file  . Alcohol Use: No     last drink was Apr 16 2010  . Drug Use: No  . Sexually Active: Yes   Other Topics Concern  . Not on file   Social History Narrative  . No narrative on file      ROS:  General: Negative for anorexia, weight loss, fever, chills, fatigue, weakness. Eyes: Negative for vision changes.  ENT: Negative for hoarseness, difficulty swallowing , nasal congestion. CV: Negative for chest pain, angina, palpitations, dyspnea on exertion. C/O peripheral edema.  Respiratory: Negative for dyspnea at rest, dyspnea on exertion, cough, sputum, wheezing.  GI: See history of present illness. GU:  Negative for dysuria, hematuria, urinary incontinence, urinary frequency, nocturnal urination.  MS: Negative for joint pain, low back pain.  Derm: Negative for rash or itching.  Neuro: Negative for weakness, abnormal sensation, seizure, frequent headaches, memory loss, confusion.  Psych: Negative for anxiety, depression, suicidal ideation, hallucinations.  Endo: Negative for unusual weight change. C/O night sweats.   Heme: Negative for bruising or bleeding. Allergy: Negative for rash or hives.    Physical Examination:  BP 132/78  Pulse 114  Temp(Src) 98.2 F (36.8 C) (Temporal)  Ht 5\' 9"  (1.753 m)  Wt 145 lb 3.2 oz (65.862 kg)  BMI 21.44 kg/m2   General: Well-nourished, well-developed in no acute distress. Accompanied by wife.  Head: Normocephalic, atraumatic.   Eyes: Conjunctiva pink, no icterus. Mouth: Oropharyngeal mucosa moist and  pink , no lesions erythema or exudate. Neck: Supple without thyromegaly, masses, or lymphadenopathy.  Lungs: Clear to auscultation bilaterally.  Heart: Regular rate and rhythm, no murmurs rubs or gallops.  Abdomen: Bowel sounds are normal, mild left mid abd tenderness. Slight abd distention but soft. No hepatosplenomegaly or masses, no abdominal bruits or    hernia , no rebound or guarding.   Rectal: By PCP, heme neg, normal. Extremities:Trace PE, lower extremity. No clubbing or deformities.  Neuro: Alert and oriented x 4 , grossly normal neurologically.  Skin: Warm and dry, no  rash or jaundice.   Psych: Alert and cooperative, normal mood and affect.  Labs: WBC 7300, Hgb 12.3, Cre 0.7.  Imaging Studies: Dg Chest 2 View  05/09/2011  *RADIOLOGY REPORT*  Clinical Data: Of cough and shortness of breath  CHEST - 2 VIEW  Comparison: Chest radiograph 03/03/2011  Findings: The left subclavian power port terminates in the proximal superior vena cava, stable.  Borderline cardiomegaly is stable. The pulmonary vascularity is normal.  The lungs are well expanded and clear.  There are no pleural effusions or pneumothorax.  The bones are osteopenic.  No acute bony abnormality is identified. Surgical clips in the upper abdomen.  IMPRESSION: No acute cardiopulmonary disease.  Original Report Authenticated By: Britta Mccreedy, M.D.   Ct Angio Chest W/cm &/or Wo Cm  05/09/2011  *RADIOLOGY REPORT*  Clinical Data: Acute shortness of breath and chest pain.  History of colon cancer and pulmonary embolism.  CT ANGIOGRAPHY CHEST  Technique:  Multidetector CT imaging of the chest using the standard protocol during bolus administration of intravenous contrast. Multiplanar reconstructed images including MIPs were obtained and reviewed to evaluate the vascular anatomy.  Contrast: OMNIPAQUE IOHEXOL 350 MG/ML IV SOLN  Comparison: Chest radiograph 02/17/2013and CT PE study 01/17/2011  Findings: Left chest wall Port-A-Cath is  present.  The distal tip of the catheter terminates in the superior vena cava.  Mild cardiomegaly with left ventricular prominence.  Coronary artery atherosclerotic calcification is noted.  The thoracic aorta is normal in caliber and enhancement.  Negative for dissection.  This is satisfactory evaluation of the pulmonary arterial tree. Central pulmonary arteries are normal in caliber. There are no filling defects in the pulmonary arteries to suggest pulmonary embolism. Specifically, the filling defects seen in the right lower lobe pulmonary arteries on the study of 01/17/2011 have resolved. These pulmonary arteries opacify normally on today's examination.  There is no pleural or pericardial effusion. No lymphadenopathy is identified in the chest.  There is a very thin ostial 1 mm linear filling defect in the right main is 10 bronchus.  This could be a tiny amount of mucus, and was not present on the prior study.  Aside from mild dependent atelectasis., the lungs are clear.  No acute or suspicious bony abnormality.  Mild convex right scoliosis of the upper thoracic spine.  IMPRESSION:  1.  Negative for pulmonary embolism. 2.  Previously identified pulmonary emboli in the right lower lobe on the study of 01/17/2011 have resolved. 2. Question very tiny strand of mucus in the right mainstem bronchus. 3. Coronary artery atherosclerotic calcification and mild of the ventricular prominence.  Original Report Authenticated By: Britta Mccreedy, M.D.   Ct Entero Abd/pelvis W/cm  04/16/2011  *RADIOLOGY REPORT*  Clinical Data:  History of malrotated small bowel.  Dilated small bowel.  Nausea and vomiting.  History of colon cancer.  CT ABDOMEN AND PELVIS WITH CONTRAST (CT ENTEROGRAPHY)  Technique:  Multidetector CT of the abdomen and pelvis during bolus administration of intravenous contrast. Negative oral contrast VoLumen was given.  Contrast: OMNIPAQUE IOHEXOL 300 MG/ML IV SOLN  Comparison:  Plain films of the 12/04/2010.   Most recent abdominal CT of 12/01/2010.  Findings: Mild bibasilar dependent atelectasis. Normal heart size without pericardial or pleural effusion.  Small hiatal hernia with decreased mild esophageal wall thickening.  Mild hepatic steatosis.  Too small to characterize left hepatic lobe lesion on image 20 was similar on the prior exam.  Normal spleen.  Surgical sutures within the gastric body.  The proximal duodenum is mildly prominent, with normal caliber of the transverse duodenum.  No obstructive cause identified.  Normal pancreas, without pancreatic ductal dilatation.  Status post cholecystectomy. Common duct is upper normal for prior cholecystectomy state, 11 mm. No obstructive cause identified.  Normal adrenal glands.  Right renal cysts and too small to characterize lesions.  Normal left kidney.  Accessory upper pole right renal artery.  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 No retroperitoneal or retrocrural adenopathy.  Surgical sutures are identified within the hepatic flexure of the colon.  The colon is normal in caliber.  Moderate amount of stool within.  The ascending colon appears thick-walled, including on image 50.  This is favored to be due to underdistension.  Normal terminal ileum.  The extent of small bowel distention with neutral contrast is moderate.  Despite the history submitted, the duodenal jejunal junction appears normal in position, with the transverse duodenum crossing the midline.  Mild small bowel wall thickening is suspected on the left upper quadrant.  Including on image 35 of series 3.  Inferior to this, mid small bowel loops measure up to 3.7 cm in the left lower quadrant, mildly dilated. No focal transition is identified.  No pneumatosis or free intraperitoneal air.  Distal small bowel loops are normal to decompressed.  No focal transition point is identified.  No mucosal hyperenhancement.  No mesenteric abnormality.  Extensive surgical clips in the small bowel  mesentery. No pelvic adenopathy.    Normal urinary bladder and prostate.  No significant free fluid.  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  2.0 cm on image 82 of series 2.  Similar back to 12/31/2009.  IMPRESSION:  1.  No evidence of small bowel malrotation.  The duodenal jejunal junction appears normal in position. 2.  Proximal to mid small bowel dilatation, mild, 3.7 cm.  Normal caliber distal small bowel with no focal transition point identified.  Question mild adynamic ileus and/or postoperative atony. 3.  Suspicion of mild small bowel wall thickening in the left upper quadrant.  Cannot exclude mild enteritis. 4.  Apparent ascending colonic wall thickening, favored to be due to underdistension.  Correlate with any symptoms of colitis. 5.  Hepatic steatosis. 6.  No evidence of metastatic disease.  Too small to characterize liver lesion is unchanged. 7. Possible constipation.  Original Report Authenticated By: Consuello Bossier, M.D.

## 2011-05-13 NOTE — Patient Instructions (Signed)
Please continue clear liquid diet until you hear from [place] about your CT scan.

## 2011-05-13 NOTE — Assessment & Plan Note (Signed)
Recurrent left mid-abdominal pain, pp bloating/nausea since surgery for colon cancer last year. Remote h/o multiple SBOs, 8 surgeries total. KUB today with findings s/o partial SBO and ?wall thickening of bowel in left abdomen (seen on prior studies as well). Discussed with Dr. Jena Gauss. STAT CT A/P with contrast today. If transition point seen, he will need surgery. Patient able to tolerate food/liquid today. No need for hospital at this point. Continue clear liquids until CT report available.

## 2011-05-14 ENCOUNTER — Other Ambulatory Visit: Payer: Self-pay | Admitting: Gastroenterology

## 2011-05-14 MED ORDER — POLYETHYLENE GLYCOL 3350 17 GM/SCOOP PO POWD: 17.0000 g | Freq: Every day | ORAL | Status: AC

## 2011-05-14 NOTE — Progress Notes (Signed)
Quick Note:  Discussed with RMR and patient. No bowel obstruction. Some mild gaseous distention of large and small bowel.  Per RMR.  Begin Miralax 17g daily. RX sent to [facility] [place]. Pt informed he may have to buy OTC. Start probiotic daily such as align or philips colon health. Stop carafate.  PR on Monday. If worsening symptoms over the weekend, go to ED. ______

## 2011-05-14 NOTE — Progress Notes (Signed)
Faxed to PCP

## 2011-05-19 ENCOUNTER — Encounter (HOSPITAL_COMMUNITY): Payer: Federal, State, Local not specified - PPO | Attending: Oncology

## 2011-05-19 DIAGNOSIS — C189 Malignant neoplasm of colon, unspecified: Secondary | ICD-10-CM | POA: Insufficient documentation

## 2011-05-19 LAB — CBC
HCT: 38.5 % — ABNORMAL LOW (ref 39.0–52.0)
MCH: 31.4 pg (ref 26.0–34.0)
MCV: 95.3 fL (ref 78.0–100.0)
Platelets: 198 10*3/uL (ref 150–400)
RBC: 4.04 MIL/uL — ABNORMAL LOW (ref 4.22–5.81)
RDW: 14 % (ref 11.5–15.5)
WBC: 6.9 10*3/uL (ref 4.0–10.5)

## 2011-05-19 LAB — DIFFERENTIAL
Eosinophils Absolute: 0.2 10*3/uL (ref 0.0–0.7)
Eosinophils Relative: 3 % (ref 0–5)
Lymphocytes Relative: 36 % (ref 12–46)
Lymphs Abs: 2.5 10*3/uL (ref 0.7–4.0)
Monocytes Absolute: 0.9 10*3/uL (ref 0.1–1.0)

## 2011-05-19 LAB — CEA: CEA: 3.9 ng/mL (ref 0.0–5.0)

## 2011-05-21 ENCOUNTER — Encounter (HOSPITAL_COMMUNITY): Payer: Federal, State, Local not specified - PPO | Attending: Oncology | Admitting: Oncology

## 2011-05-21 VITALS — BP 151/82 | HR 87 | Temp 97.5°F | Wt 145.8 lb

## 2011-05-21 DIAGNOSIS — C186 Malignant neoplasm of descending colon: Secondary | ICD-10-CM

## 2011-05-21 DIAGNOSIS — G609 Hereditary and idiopathic neuropathy, unspecified: Secondary | ICD-10-CM

## 2011-05-21 DIAGNOSIS — C189 Malignant neoplasm of colon, unspecified: Secondary | ICD-10-CM | POA: Insufficient documentation

## 2011-05-21 NOTE — Progress Notes (Signed)
CC:   [name]  DIAGNOSES: 1. Stage III cancer of the colon, status post surgical resection, and     postsurgical adjuvant chemotherapy for 7 doses of a planned 12 of     FOLFOX. 2. History of gastrointestinal bleeding. 3. History of multiple abdominal surgical procedures, 12 in number. 4. Chronic pain. 5. History of excessive alcohol use in the past. 6. History of pulmonary embolus, and he will take his injections for 6     months total which should end in mid April, and there were some     interruptions. 7. History of probable Coumadin toxicity with an elevated INR in the a     setting of a person who denied any Coumadin use but had a positive     Coumadin level, even though it was very unusual. 8. History of pneumonia. 9. History of a transient ischemic attack. 10.History of hypertension. 11.History of acid reflux disease. 12.Grade 1 peripheral neuropathy. He is here today for follow up, and is probably looking the best I have ever seen him.  His abdominal pain is pretty well-controlled with his therapeutic intervention of medications, which are listed in the chart.  They of course, have included tramadol in the past.  He is still on that, and that is all I see really for pain presently.  He was at 1 time on narcotics.  He has not seen blood in his stools.  He says he is just chronically short-winded at times, but he is very deconditioned.  PHYSICAL EXAMINATION:  Vital signs: His weight is 145 pounds, blood pressure 151/82 in the right arm sitting position.  Pulse 80 and regular, respirations 16-18 and unlabored, and he is afebrile.  He has a pain score of 2 he states.  He has no lymphadenopathy.  He has clear lung fields but with diminished breath sounds.  His heart shows a regular rhythm and rate.  I did not hear a murmur or gallop. His port is intact left upper chest wall.  His abdomen is very flat. He has no obvious hepatosplenomegaly.  He has no leg edema.  He  states he still has some numbness in his feet and a little bit in his hands, no more than grade 1, based upon what he is saying.  I am not sure his peripheral neuropathy is either from  oxaliplatin or just nutritional, but it is certainly a combination of both potentially.  We will see him back in 3 months and 6 months for CEA, and Jenita Seashore, my PA, will see him in 6 months.  His recent CT scan on the 21st of February showed no evidence of recurrent disease.  The CEA is 3.9, that is in the normal range.  It is probably a little higher than usual because of his smoking, and he is still smoking he states.  So we will see him then, sooner if need be.  He of course has a host of medical problems and, hopefully, he will not go back to using alcohol.    ______________________________ Ladona Horns. Mariel Sleet, MD ESN/MEDQ  D:  05/21/2011  T:  05/21/2011  Job:  [id number]

## 2011-05-21 NOTE — Patient Instructions (Signed)
[facility]  Discharge Instructions [name]  [id number] 06/02/1950 RECOMMENDATIONS MADE BY THE CONSULTANT AND ANY TEST RESULTS WILL BE SENT TO YOUR REFERRING DOCTOR.   EXAM FINDINGS BY MD TODAY AND SIGNS AND SYMPTOMS TO REPORT TO CLINIC OR PRIMARY MD: Exam findings as discussed by Dr. Mariel Sleet.  SPECIAL INSTRUCTIONS/FOLLOW-UP: 1.  CEA lab every 3 months x 1 year 2.  Stop your Lovenox after a total of 6 months time (mid-April 2013) 3.  Port flush every 6 weeks 4.  Follow-up in the office in 6 months after labs to see T. Kefalas, PA-C.  I acknowledge that I have been informed and understand all the instructions given to me and received a copy. I do not have any more questions at this time, but understand that I may call the [facility]ty [facility] at [facility] at [phone] during business hours should I have any further questions or need assistance in obtaining follow-up care.    __________________________________________  _____________  __________ Signature of Patient or Authorized Representative            Date                   Time    __________________________________________ Nurse's Signature

## 2011-05-21 NOTE — Progress Notes (Signed)
This office note has been dictated.

## 2011-05-26 ENCOUNTER — Emergency Department (HOSPITAL_COMMUNITY): Payer: Federal, State, Local not specified - PPO

## 2011-05-26 ENCOUNTER — Inpatient Hospital Stay (HOSPITAL_COMMUNITY)
Admission: EM | Admit: 2011-05-26 | Discharge: 2011-05-29 | DRG: 174 | Disposition: A | Discharge status: Home / Self Care | Payer: Federal, State, Local not specified - PPO | Attending: Internal Medicine | Admitting: Internal Medicine

## 2011-05-26 ENCOUNTER — Encounter (HOSPITAL_COMMUNITY): Payer: Self-pay | Admitting: *Deleted

## 2011-05-26 DIAGNOSIS — M810 Age-related osteoporosis without current pathological fracture: Secondary | ICD-10-CM | POA: Diagnosis present

## 2011-05-26 DIAGNOSIS — R1110 Vomiting, unspecified: Secondary | ICD-10-CM

## 2011-05-26 DIAGNOSIS — Z903 Acquired absence of stomach [part of]: Secondary | ICD-10-CM

## 2011-05-26 DIAGNOSIS — K208 Other esophagitis without bleeding: Secondary | ICD-10-CM | POA: Diagnosis present

## 2011-05-26 DIAGNOSIS — K922 Gastrointestinal hemorrhage, unspecified: Secondary | ICD-10-CM

## 2011-05-26 DIAGNOSIS — T451X5A Adverse effect of antineoplastic and immunosuppressive drugs, initial encounter: Secondary | ICD-10-CM

## 2011-05-26 DIAGNOSIS — E876 Hypokalemia: Secondary | ICD-10-CM | POA: Diagnosis present

## 2011-05-26 DIAGNOSIS — F411 Generalized anxiety disorder: Secondary | ICD-10-CM | POA: Diagnosis present

## 2011-05-26 DIAGNOSIS — D638 Anemia in other chronic diseases classified elsewhere: Secondary | ICD-10-CM

## 2011-05-26 DIAGNOSIS — R079 Chest pain, unspecified: Secondary | ICD-10-CM

## 2011-05-26 DIAGNOSIS — D61810 Antineoplastic chemotherapy induced pancytopenia: Secondary | ICD-10-CM

## 2011-05-26 DIAGNOSIS — R509 Fever, unspecified: Secondary | ICD-10-CM

## 2011-05-26 DIAGNOSIS — I2699 Other pulmonary embolism without acute cor pulmonale: Secondary | ICD-10-CM

## 2011-05-26 DIAGNOSIS — K226 Gastro-esophageal laceration-hemorrhage syndrome: Principal | ICD-10-CM | POA: Diagnosis present

## 2011-05-26 DIAGNOSIS — C189 Malignant neoplasm of colon, unspecified: Secondary | ICD-10-CM

## 2011-05-26 DIAGNOSIS — K560 Paralytic ileus: Secondary | ICD-10-CM | POA: Diagnosis present

## 2011-05-26 DIAGNOSIS — I10 Essential (primary) hypertension: Secondary | ICD-10-CM | POA: Diagnosis present

## 2011-05-26 DIAGNOSIS — Z86711 Personal history of pulmonary embolism: Secondary | ICD-10-CM

## 2011-05-26 DIAGNOSIS — K219 Gastro-esophageal reflux disease without esophagitis: Secondary | ICD-10-CM

## 2011-05-26 DIAGNOSIS — K529 Noninfective gastroenteritis and colitis, unspecified: Secondary | ICD-10-CM

## 2011-05-26 DIAGNOSIS — D689 Coagulation defect, unspecified: Secondary | ICD-10-CM

## 2011-05-26 DIAGNOSIS — R791 Abnormal coagulation profile: Secondary | ICD-10-CM

## 2011-05-26 DIAGNOSIS — K920 Hematemesis: Secondary | ICD-10-CM

## 2011-05-26 DIAGNOSIS — Z8673 Personal history of transient ischemic attack (TIA), and cerebral infarction without residual deficits: Secondary | ICD-10-CM

## 2011-05-26 DIAGNOSIS — R109 Unspecified abdominal pain: Secondary | ICD-10-CM

## 2011-05-26 DIAGNOSIS — J209 Acute bronchitis, unspecified: Secondary | ICD-10-CM

## 2011-05-26 DIAGNOSIS — J189 Pneumonia, unspecified organism: Secondary | ICD-10-CM

## 2011-05-26 DIAGNOSIS — R112 Nausea with vomiting, unspecified: Secondary | ICD-10-CM

## 2011-05-26 DIAGNOSIS — K56609 Unspecified intestinal obstruction, unspecified as to partial versus complete obstruction: Secondary | ICD-10-CM

## 2011-05-26 DIAGNOSIS — K2210 Ulcer of esophagus without bleeding: Secondary | ICD-10-CM

## 2011-05-26 DIAGNOSIS — G8929 Other chronic pain: Secondary | ICD-10-CM

## 2011-05-26 DIAGNOSIS — Z85038 Personal history of other malignant neoplasm of large intestine: Secondary | ICD-10-CM

## 2011-05-26 LAB — COMPREHENSIVE METABOLIC PANEL
AST: 17 U/L (ref 0–37)
Albumin: 3 g/dL — ABNORMAL LOW (ref 3.5–5.2)
BUN: 23 mg/dL (ref 6–23)
Calcium: 8.9 mg/dL (ref 8.4–10.5)
Creatinine, Ser: 0.86 mg/dL (ref 0.50–1.35)
Total Bilirubin: 0.2 mg/dL — ABNORMAL LOW (ref 0.3–1.2)
Total Protein: 6.3 g/dL (ref 6.0–8.3)

## 2011-05-26 LAB — DIFFERENTIAL
Basophils Absolute: 0 10*3/uL (ref 0.0–0.1)
Basophils Relative: 0 % (ref 0–1)
Eosinophils Absolute: 0.1 10*3/uL (ref 0.0–0.7)
Eosinophils Relative: 1 % (ref 0–5)
Monocytes Absolute: 1 10*3/uL (ref 0.1–1.0)
Monocytes Relative: 13 % — ABNORMAL HIGH (ref 3–12)

## 2011-05-26 LAB — PROTIME-INR
INR: 1.93 — ABNORMAL HIGH (ref 0.00–1.49)
Prothrombin Time: 22.4 seconds — ABNORMAL HIGH (ref 11.6–15.2)

## 2011-05-26 LAB — CBC
HCT: 40.1 % (ref 39.0–52.0)
Hemoglobin: 13.6 g/dL (ref 13.0–17.0)
MCH: 31.9 pg (ref 26.0–34.0)
MCHC: 33.9 g/dL (ref 30.0–36.0)
RDW: 14.1 % (ref 11.5–15.5)

## 2011-05-26 LAB — APTT: aPTT: 33 seconds (ref 24–37)

## 2011-05-26 LAB — LIPASE, BLOOD: Lipase: 24 U/L (ref 11–59)

## 2011-05-26 MED ORDER — SODIUM CHLORIDE 0.9 % IV SOLN
Freq: Once | INTRAVENOUS | Status: AC
  Administered 2011-05-26: 1000 mL via INTRAVENOUS

## 2011-05-26 MED ORDER — PANTOPRAZOLE SODIUM 40 MG IV SOLR
INTRAVENOUS | Status: AC
  Filled 2011-05-26: qty 80

## 2011-05-26 MED ORDER — ONDANSETRON HCL 4 MG/2ML IJ SOLN
4.0000 mg | Freq: Once | INTRAMUSCULAR | Status: AC
  Administered 2011-05-26: 4 mg via INTRAVENOUS
  Filled 2011-05-26: qty 2

## 2011-05-26 MED ORDER — SODIUM CHLORIDE 0.9 % IV BOLUS (SEPSIS)
1000.0000 mL | Freq: Once | INTRAVENOUS | Status: AC
  Administered 2011-05-26: 1000 mL via INTRAVENOUS

## 2011-05-26 MED ORDER — MORPHINE SULFATE 4 MG/ML IJ SOLN
4.0000 mg | Freq: Once | INTRAMUSCULAR | Status: AC
  Administered 2011-05-26: 4 mg via INTRAVENOUS
  Filled 2011-05-26: qty 1

## 2011-05-26 MED ORDER — SODIUM CHLORIDE 0.9 % IV SOLN
8.0000 mg/h | INTRAVENOUS | Status: DC
  Administered 2011-05-26 – 2011-05-27 (×2): 8 mg/h via INTRAVENOUS
  Filled 2011-05-26 (×7): qty 80

## 2011-05-26 MED ORDER — SODIUM CHLORIDE 0.9 % IV SOLN
80.0000 mg | Freq: Once | INTRAVENOUS | Status: AC
  Administered 2011-05-26: 80 mg via INTRAVENOUS
  Filled 2011-05-26: qty 80

## 2011-05-26 NOTE — ED Notes (Signed)
Pt requesting pain medication at this time. EDP notified. 

## 2011-05-26 NOTE — ED Notes (Signed)
Viewed hemaocult w/ PA. Negative results.

## 2011-05-26 NOTE — ED Provider Notes (Addendum)
History     CSN: [id number]  Arrival date & time 05/26/11  2155   First MD Initiated Contact with Patient 05/26/11 2206      Chief Complaint  Patient presents with  . Nausea    (Consider location/radiation/quality/duration/timing/severity/associated sxs/prior treatment) HPI Comments: Patient presents for evaluation of nausea and vomiting including diarrhea which started 2 days ago.  Around 5 PM this evening he developed hematemesis, describing 3 separate episodes most recently an hour before arrival.  He describes approximately 10 cc of bright red blood with each episode of hematemesis.  She has also had left upper tunnel cramping which has been intermittent along with diarrhea which has been watery with mucus but without either bright red or dark blood.  He does have a history of erosive esophagitis and underwent endoscopy last summer for the same symptoms.  He is on Lovenox currently due to history of pulmonary embolism, but states he has not taken his Lovenox for his other home medicines for the past 2 days.  He has had no by mouth intake in 2 days, and does feel weak and slightly lightheaded when standing.  He denies fevers and chills.  Patient is a 61 y.o. male presenting with GI illlness. The history is provided by the patient.  GI Problem  This is a recurrent problem. The current episode started 2 days ago. The problem has not changed since onset.The stool consistency is described as watery. There has been no fever. Associated symptoms include abdominal pain and vomiting. Pertinent negatives include no headaches and no arthralgias. He has tried nothing for the symptoms. Risk factors include ill contacts (Wife reports she has had vomiting and diarrhea which started yesterday.). His past medical history is significant for bowel resection. Past medical history comments: Past history is also significant for adenocarcinoma, partial gastrectomy including a Billroth II procedure, and distant  history of etoh abuse.[name]    Past Medical History  Diagnosis Date  . 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 Pulmonary embolism 02/2010  . Acid reflux   . Hypertension   . Osteoporosis   . Arthritis   . TIA (transient ischemic attack) 10/11  . ETOH abuse     quit 03/2010  . 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 Personal history of PE (pulmonary embolism) 10/01/2010  . Blood transfusion   . S/P endoscopy September 28, 2010    erosive reflux esophagitis, Billroth I anatomy  . Angina   . Shortness of breath   . Sleep apnea   . Recurrent upper respiratory infection (URI)   . Anxiety   . Pneumonia   . Anemia     Past Surgical History  Procedure Date  . Hernia repair     right inguinal  . Appendectomy 1980s  . Cholecystectomy 1980s  . Colon surgery May 2012    left hemicolectomy, colon cancer found at time of surgery for bowel obstruction  . Portacath placement   . Abdominal sugery     for bowel obstruction x 8, all in 1980s, except for one in 07/2010  . Esophagogastroduodenoscopy 09/28/2010  . Esophagogastroduodenoscopy 12/01/2010    Cervical web status post dilation, erosive esophagitis, B1 hemigastrectomy, inflamed anastomosis  . Colonoscopy 03/18/2011    anastomosis at 35cm. Several adenomatous polyps removed. Sigmoid diverticulosis. Next TCS 02/2013  . Esophagogastroduodenoscopy 04/16/2011    excoriation at [facility] c/w trauma/M-W tear, friable gastric anastomosis, dilation efferent limb  . Billroth 1 hemigastrectomy 1980s  per patient for benign duodenal tumor    Family History  Problem Relation Age of Onset  . Hypertension Mother   . Arthritis Mother   . Hypertension Father   . Colon cancer Neg Hx     History  Substance Use Topics  . Smoking status: Current Everyday Smoker -- 0.5 packs/day for 40 years    Types: Cigarettes  . Smokeless tobacco: Not on file  . Alcohol Use: No     last drink was Apr 16 2010      Review of Systems    Constitutional: Negative for fever.  HENT: Negative for congestion, sore throat and neck pain.   Eyes: Negative.   Respiratory: Negative for chest tightness and shortness of breath.   Cardiovascular: Negative for chest pain.  Gastrointestinal: Positive for nausea, vomiting, abdominal pain and diarrhea.  Genitourinary: Negative.   Musculoskeletal: Negative for joint swelling and arthralgias.  Skin: Negative.  Negative for rash and wound.  Neurological: Positive for weakness. Negative for dizziness, light-headedness, numbness and headaches.  Hematological: Negative.     Allergies  Review of patient's allergies indicates no known allergies.  Home Medications   Current Outpatient Rx  Name Route Sig Dispense Refill  . DEXLANSOPRAZOLE 60 MG PO CPDR Oral Take 1 capsule (60 mg total) by mouth daily. 30 capsule 11  . ENOXAPARIN SODIUM 60 MG/0.6ML [place] SOLN Subcutaneous Inject 60 mg into the skin at bedtime.    [name] FOLIC ACID 1 MG PO TABS Oral Take 1 mg by mouth daily.      [name] LORAZEPAM 1 MG PO TABS Oral Take 1 mg by mouth daily as needed. For anxiety    . PROCHLORPERAZINE MALEATE 10 MG PO TABS Oral Take 10 mg by mouth every 6 (six) hours as needed. Nausea and Vomiting    . SERTRALINE HCL 50 MG PO TABS Oral Take 50 mg by mouth at bedtime.     . THIAMINE HCL 100 MG PO TABS Oral Take 100 mg by mouth daily.      . TRAMADOL HCL 50 MG PO TABS Oral Take 50 mg by mouth 4 (four) times daily as needed. For pain    . DIOVAN HCT PO Oral Take 1 tablet by mouth daily.    . CYANOCOBALAMIN 1000 MCG/ML IJ SOLN Intramuscular Inject 1,000 mcg into the muscle every 30 (thirty) days.      BP 111/69  Pulse 87  Temp(Src) 97.6 F (36.4 C) (Oral)  Resp 16  Ht 5\' 9"  (1.753 m)  Wt 145 lb (65.772 kg)  BMI 21.41 kg/m2  SpO2 97%  Physical Exam  Nursing note and vitals reviewed. Constitutional: He appears well-developed and well-nourished.  HENT:  Head: Normocephalic and atraumatic.  Eyes: Conjunctivae are  normal.  Neck: Normal range of motion.  Cardiovascular: Normal rate, regular rhythm, normal heart sounds and intact distal pulses.   Pulmonary/Chest: Effort normal and breath sounds normal. He has no wheezes.  Abdominal: Bowel sounds are normal. There is no hepatosplenomegaly. There is tenderness in the left upper quadrant. There is no rigidity, no rebound and no guarding.  Musculoskeletal: Normal range of motion.  Neurological: He is alert. He exhibits normal muscle tone.  Skin: Skin is warm and dry.  Psychiatric: He has a normal mood and affect.    ED Course  Procedures (including critical care time)  Labs Reviewed  DIFFERENTIAL - Abnormal; Notable for the following:    Monocytes Relative 13 (*)    All other components within normal  limits  COMPREHENSIVE METABOLIC PANEL - Abnormal; Notable for the following:    Potassium 3.1 (*)    CO2 18 (*)    Glucose, Bld 178 (*)    Albumin 3.0 (*)    Total Bilirubin 0.2 (*)    All other components within normal limits  PROTIME-INR - Abnormal; Notable for the following:    Prothrombin Time 22.4 (*)    INR 1.93 (*)    All other components within normal limits  CBC  LIPASE, BLOOD  APTT  TYPE AND SCREEN   Dg Chest Port 1 View  05/26/2011  *RADIOLOGY REPORT*  Clinical Data: Hemoptysis, nausea, vomiting, history smoking, carcinoma of the colon  PORTABLE CHEST - 1 VIEW  Comparison: Portable exam 2250 hours compared to 05/09/2011  Findings: Left subclavian power port, tip projecting over SVC. Normal heart size and pulmonary vascularity for technique. Tortuous aorta. Lungs appear mildly emphysematous but clear. No pleural effusion or pneumothorax. No acute osseous findings.  IMPRESSION: No acute abnormalities.  Original Report Authenticated By: Lollie Marrow, M.D.     1. GI bleed   2. Vomiting and diarrhea       MDM   Spoke with Dr. Orvan Falconer with [facility] who will admit patient.        Candis Musa, PA 05/27/11  0118  Candis Musa, PA 05/27/11 0118  Candis Musa, PA 05/27/11 0121  Candis Musa, PA 07/09/11 2228

## 2011-05-26 NOTE — ED Notes (Signed)
Pt reports n/v/d that started Monday. Pt reports noticed some blood in emimis today.

## 2011-05-26 NOTE — ED Notes (Signed)
Nausea and vomiting for 2 days, vomiting blood onset today

## 2011-05-27 ENCOUNTER — Encounter (HOSPITAL_COMMUNITY): Payer: Self-pay | Admitting: Emergency Medicine

## 2011-05-27 DIAGNOSIS — Z85038 Personal history of other malignant neoplasm of large intestine: Secondary | ICD-10-CM

## 2011-05-27 DIAGNOSIS — K529 Noninfective gastroenteritis and colitis, unspecified: Secondary | ICD-10-CM | POA: Diagnosis present

## 2011-05-27 DIAGNOSIS — K920 Hematemesis: Secondary | ICD-10-CM

## 2011-05-27 DIAGNOSIS — K922 Gastrointestinal hemorrhage, unspecified: Secondary | ICD-10-CM | POA: Diagnosis present

## 2011-05-27 DIAGNOSIS — D689 Coagulation defect, unspecified: Secondary | ICD-10-CM | POA: Diagnosis present

## 2011-05-27 DIAGNOSIS — R197 Diarrhea, unspecified: Secondary | ICD-10-CM

## 2011-05-27 DIAGNOSIS — D649 Anemia, unspecified: Secondary | ICD-10-CM

## 2011-05-27 LAB — HEMOGLOBIN AND HEMATOCRIT, BLOOD
HCT: 34.5 % — ABNORMAL LOW (ref 39.0–52.0)
Hemoglobin: 11.4 g/dL — ABNORMAL LOW (ref 13.0–17.0)

## 2011-05-27 LAB — BASIC METABOLIC PANEL
Calcium: 8.2 mg/dL — ABNORMAL LOW (ref 8.4–10.5)
Creatinine, Ser: 0.75 mg/dL (ref 0.50–1.35)
GFR calc Af Amer: >90 mL/min (ref >90)
GFR calc non Af Amer: >90 mL/min (ref >90)

## 2011-05-27 LAB — CBC
MCHC: 33.6 g/dL (ref 30.0–36.0)
Platelets: 146 10*3/uL — ABNORMAL LOW (ref 150–400)
RDW: 14.1 % (ref 11.5–15.5)
WBC: 6.4 10*3/uL (ref 4.0–10.5)

## 2011-05-27 LAB — PROTIME-INR
INR: 1.04 (ref 0.00–1.49)
Prothrombin Time: 13.8 seconds (ref 11.6–15.2)

## 2011-05-27 MED ORDER — VITAMIN K1 10 MG/ML IJ SOLN
INTRAMUSCULAR | Status: AC
  Filled 2011-05-27: qty 1

## 2011-05-27 MED ORDER — HYDROMORPHONE HCL PF 1 MG/ML IJ SOLN
INTRAMUSCULAR | Status: AC
  Filled 2011-05-27: qty 1

## 2011-05-27 MED ORDER — BISACODYL 10 MG RE SUPP: 10.0000 mg | Freq: Every day | RECTAL | Status: DC | PRN

## 2011-05-27 MED ORDER — POTASSIUM CHLORIDE IN NACL 20-0.9 MEQ/L-% IV SOLN
INTRAVENOUS | Status: DC
  Administered 2011-05-27 – 2011-05-29 (×4): via INTRAVENOUS

## 2011-05-27 MED ORDER — MORPHINE SULFATE 4 MG/ML IJ SOLN
4.0000 mg | Freq: Once | INTRAMUSCULAR | Status: AC
  Administered 2011-05-27: 4 mg via INTRAVENOUS
  Filled 2011-05-27: qty 1

## 2011-05-27 MED ORDER — SODIUM CHLORIDE 0.9 % IV SOLN: INTRAVENOUS | Status: DC

## 2011-05-27 MED ORDER — PROCHLORPERAZINE EDISYLATE 5 MG/ML IJ SOLN
10.0000 mg | Freq: Four times a day (QID) | INTRAMUSCULAR | Status: DC | PRN
  Filled 2011-05-27: qty 2

## 2011-05-27 MED ORDER — ONDANSETRON HCL 4 MG/2ML IJ SOLN: 4.0000 mg | Freq: Three times a day (TID) | INTRAMUSCULAR | Status: DC | PRN

## 2011-05-27 MED ORDER — ONDANSETRON HCL 4 MG/2ML IJ SOLN
INTRAMUSCULAR | Status: AC
  Filled 2011-05-27: qty 2

## 2011-05-27 MED ORDER — SODIUM CHLORIDE 0.9 % IJ SOLN
INTRAMUSCULAR | Status: AC
  Administered 2011-05-27: 10 mL
  Filled 2011-05-27: qty 3

## 2011-05-27 MED ORDER — SUCRALFATE 1 G PO TABS
1.0000 g | ORAL_TABLET | Freq: Three times a day (TID) | ORAL | Status: DC
  Administered 2011-05-27 – 2011-05-29 (×8): 1 g via ORAL
  Filled 2011-05-27 (×9): qty 1

## 2011-05-27 MED ORDER — VITAMIN K1 10 MG/ML IJ SOLN
10.0000 mg | INTRAVENOUS | Status: AC
  Administered 2011-05-27: 10 mg via INTRAVENOUS
  Filled 2011-05-27: qty 1

## 2011-05-27 MED ORDER — NICOTINE 14 MG/24HR TD PT24
14.0000 mg | MEDICATED_PATCH | Freq: Every day | TRANSDERMAL | Status: DC
  Administered 2011-05-27 – 2011-05-28 (×3): 14 mg via TRANSDERMAL
  Filled 2011-05-27 (×3): qty 1

## 2011-05-27 MED ORDER — ACETAMINOPHEN 650 MG RE SUPP: 650.0000 mg | Freq: Four times a day (QID) | RECTAL | Status: DC | PRN

## 2011-05-27 MED ORDER — ONDANSETRON HCL 4 MG/2ML IJ SOLN
4.0000 mg | INTRAMUSCULAR | Status: DC | PRN
  Administered 2011-05-27 – 2011-05-28 (×6): 4 mg via INTRAVENOUS
  Filled 2011-05-27 (×5): qty 2

## 2011-05-27 MED ORDER — LORAZEPAM 2 MG/ML IJ SOLN: 0.5000 mg | Freq: Four times a day (QID) | INTRAMUSCULAR | Status: DC | PRN

## 2011-05-27 MED ORDER — ACETAMINOPHEN 325 MG PO TABS: 650.0000 mg | ORAL_TABLET | Freq: Four times a day (QID) | ORAL | Status: DC | PRN

## 2011-05-27 MED ORDER — HYDROMORPHONE HCL PF 1 MG/ML IJ SOLN
0.5000 mg | INTRAMUSCULAR | Status: DC | PRN
  Administered 2011-05-27 – 2011-05-29 (×21): 0.5 mg via INTRAVENOUS
  Administered 2011-05-29: 09:00:00 via INTRAVENOUS
  Administered 2011-05-29 (×2): 0.5 mg via INTRAVENOUS
  Filled 2011-05-27 (×23): qty 1

## 2011-05-27 MED ORDER — PANTOPRAZOLE SODIUM 40 MG IV SOLR
40.0000 mg | Freq: Two times a day (BID) | INTRAVENOUS | Status: DC
  Administered 2011-05-27 – 2011-05-29 (×4): 40 mg via INTRAVENOUS
  Filled 2011-05-27 (×4): qty 40

## 2011-05-27 NOTE — Progress Notes (Signed)
Patient admitted earlier this morning by Dr. Orvan Falconer  Patient seen and examined, database reviewed  Gastroenteritis with hematemesis due to likely mallory weiss tear  Continue to observe for recurrence  If no recurrence by tomorrow, will advance diet, follow hemoglobin  Will need to restart anticoagulation if no more bleeding.

## 2011-05-27 NOTE — H&P (Signed)
PCP:   DAVIDSON,ERIC, MD, MD   Chief Complaint:  Vomiting blood today HPI: [name] is an 61 y.o. male.  Multiple medical problems; status post 7 cycles of chemotherapy for adenocarcinoma of the colon; on daily Lovenox for pulmonary embolism- due to complete the course in April; coagulopathy of unclear significance patient is not on Coumadin;  [name] reports she's been having vomiting and diarrhea for the past 3 days, but has been managing fairly well until about 5 PM today he noticed that he was blood in the vomitus; says he has vomited blood about 4 times, about 10 cc of blood each time. He reports he continues to have diarrhea but the stools are yellow is never black or bloody.  He does reports feeling more dizzy with standing.  He has had no hematemesis in the emergency room, his stool was tested in the emergency room and it was Hemoccult negative; his vitals have been stable throughout his emergency room course; but because of his history of the hospitalist service was called to assist with management. An INR of 1.93 is noted.  Rewiew of Systems:  The patient denies anorexia, fever, weight loss,, vision loss, decreased hearing, hoarseness, chest pain, syncope, dyspnea on exertion, peripheral edema, balance deficits, hemoptysis,  melena, hematochezia, severe indigestion/heartburn, hematuria, incontinence, genital sores, muscle weakness, suspicious skin lesions, transient blindness, difficulty walking, depression, unusual weight change, abnormal bleeding, enlarged lymph nodes, angioedema, and breast masses.    Past Medical History  Diagnosis Date  . 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 Pulmonary embolism 02/2010  . Acid reflux   . Hypertension   . Osteoporosis   . Arthritis   . TIA (transient ischemic attack) 10/11  . ETOH abuse     quit 03/2010  . 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 Personal history of PE (pulmonary embolism) 10/01/2010  .  Blood transfusion   . S/P endoscopy September 28, 2010    erosive reflux esophagitis, Billroth I anatomy  . Angina   . Shortness of breath   . Sleep apnea   . Recurrent upper respiratory infection (URI)   . Anxiety   . Pneumonia   . Anemia     Past Surgical History  Procedure Date  . Hernia repair     right inguinal  . Appendectomy 1980s  . Cholecystectomy 1980s  . Colon surgery May 2012    left hemicolectomy, colon cancer found at time of surgery for bowel obstruction  . Portacath placement   . Abdominal sugery     for bowel obstruction x 8, all in 1980s, except for one in 07/2010  . Esophagogastroduodenoscopy 09/28/2010  . Esophagogastroduodenoscopy 12/01/2010    Cervical web status post dilation, erosive esophagitis, B1 hemigastrectomy, inflamed anastomosis  . Colonoscopy 03/18/2011    anastomosis at 35cm. Several adenomatous polyps removed. Sigmoid diverticulosis. Next TCS 02/2013  . Esophagogastroduodenoscopy 04/16/2011    excoriation at [facility] c/w trauma/M-W tear, friable gastric anastomosis, dilation efferent limb  . Billroth 1 hemigastrectomy 1980s    per patient for benign duodenal tumor    Medications:  HOME MEDS: Prior to Admission medications   Medication Sig Start Date End Date Taking? Authorizing Provider  dexlansoprazole (DEXILANT) 60 MG capsule Take 1 capsule (60 mg total) by mouth daily. 03/22/11  Yes Tana Coast, PA  enoxaparin (LOVENOX) 60 MG/0.6ML SOLN Inject 60 mg into the skin at bedtime.   Yes Historical Provider, MD  folic acid (FOLVITE) 1  MG tablet Take 1 mg by mouth daily.     Yes Historical Provider, MD  LORazepam (ATIVAN) 1 MG tablet Take 1 mg by mouth daily as needed. For anxiety 04/12/11  Yes Historical Provider, MD  prochlorperazine (COMPAZINE) 10 MG tablet Take 10 mg by mouth every 6 (six) hours as needed. Nausea and Vomiting   Yes Historical Provider, MD  sertraline (ZOLOFT) 50 MG tablet Take 50 mg by mouth at bedtime.    Yes Historical Provider, MD    thiamine 100 MG tablet Take 100 mg by mouth daily.     Yes Historical Provider, MD  traMADol (ULTRAM) 50 MG tablet Take 50 mg by mouth 4 (four) times daily as needed. For pain 02/03/11  Yes Historical Provider, MD  Valsartan-Hydrochlorothiazide (DIOVAN HCT PO) Take 1 tablet by mouth daily.   Yes Historical Provider, MD  cyanocobalamin (,VITAMIN B-12,) 1000 MCG/ML injection Inject 1,000 mcg into the muscle every 30 (thirty) days.    Historical Provider, MD     Allergies:  No Known Allergies  Social History:   reports that he has been smoking Cigarettes.  He has a 20 pack-year smoking history. He does not have any smokeless tobacco history on file. He reports that he does not drink alcohol or use illicit drugs.  Family History: Family History  Problem Relation Age of Onset  . Hypertension Mother   . Arthritis Mother   . Hypertension Father   . Colon cancer Neg Hx      Physical Exam: Filed Vitals:   05/26/11 2158 05/27/11 0047 05/27/11 0152  BP: 143/96 111/69 118/76  Pulse: 115 87 82  Temp: 97.6 F (36.4 C)  98.3 F (36.8 C)  TempSrc: Oral  Oral  Resp: 20 16 18   Height: 5\' 9"  (1.753 m)  5\' 9"  (1.753 m)  Weight: 65.772 kg (145 lb)  61.9 kg (136 lb 7.4 oz)  SpO2: 99% 97% 97%   Blood pressure 118/76, pulse 82, temperature 98.3 F (36.8 C), temperature source Oral, resp. rate 18, height 5\' 9"  (1.753 m), weight 61.9 kg (136 lb 7.4 oz), SpO2 97.00%.  GEN:  Pleasant middle-aged Caucasian gentleman lying in the stretcher in no acute distress; smiling, cooperative with exam PSYCH:  alert and oriented x4; does not appear anxious does not appear depressed; affect is normal HEENT: Mucous membranes pink and anicteric; PERRLA; EOM intact; no cervical lymphadenopathy nor thyromegaly or carotid bruit; no JVD; Breasts:: Not examined CHEST WALL: No tenderness CHEST: Normal respiration, clear to auscultation bilaterally HEART: Regular rate and rhythm; no murmurs rubs or gallops BACK:; no  CVA tenderness ABDOMEN: , Scaphoid, soft,  left anterior flank tenderness; no epigastric tenderness surgical scars noted no organomegaly, normal abdominal bowel sounds; no pannus; no intertriginous candida. Rectal Exam: Heme negative stool per emergency room physician EXTREMITIES:  age-appropriate arthropathy of the hands and knees; no edema; no ulcerations. Genitalia: not examined PULSES: 2+ and symmetric SKIN: Normal hydration no rash or ulceration CNS: Cranial nerves 2-12 grossly intact no focal neurologic deficit   Labs & Imaging Results for orders placed during the hospital encounter of 05/26/11 (from the past 48 hour(s))  CBC     Status: Normal   Collection Time   05/26/11 10:37 PM      Component Value Range Comment   WBC 8.1  4.0 - 10.5 (K/uL)    RBC 4.27  4.22 - 5.81 (MIL/uL)    Hemoglobin 13.6  13.0 - 17.0 (g/dL)    HCT 45.4  09.8 -  52.0 (%)    MCV 93.9  78.0 - 100.0 (fL)    MCH 31.9  26.0 - 34.0 (pg)    MCHC 33.9  30.0 - 36.0 (g/dL)    RDW 91.4  78.2 - 95.6 (%)    Platelets 159  150 - 400 (K/uL)   DIFFERENTIAL     Status: Abnormal   Collection Time   05/26/11 10:37 PM      Component Value Range Comment   Neutrophils Relative 67  43 - 77 (%)    Neutro Abs 5.4  1.7 - 7.7 (K/uL)    Lymphocytes Relative 20  12 - 46 (%)    Lymphs Abs 1.6  0.7 - 4.0 (K/uL)    Monocytes Relative 13 (*) 3 - 12 (%)    Monocytes Absolute 1.0  0.1 - 1.0 (K/uL)    Eosinophils Relative 1  0 - 5 (%)    Eosinophils Absolute 0.1  0.0 - 0.7 (K/uL)    Basophils Relative 0  0 - 1 (%)    Basophils Absolute 0.0  0.0 - 0.1 (K/uL)   COMPREHENSIVE METABOLIC PANEL     Status: Abnormal   Collection Time   05/26/11 10:37 PM      Component Value Range Comment   Sodium 136  135 - 145 (mEq/L)    Potassium 3.1 (*) 3.5 - 5.1 (mEq/L)    Chloride 106  96 - 112 (mEq/L)    CO2 18 (*) 19 - 32 (mEq/L)    Glucose, Bld 178 (*) 70 - 99 (mg/dL)    BUN 23  6 - 23 (mg/dL)    Creatinine, Ser 2.13  0.50 - 1.35 (mg/dL)     Calcium 8.9  8.4 - 10.5 (mg/dL)    Total Protein 6.3  6.0 - 8.3 (g/dL)    Albumin 3.0 (*) 3.5 - 5.2 (g/dL)    AST 17  0 - 37 (U/L)    ALT 14  0 - 53 (U/L)    Alkaline Phosphatase 83  39 - 117 (U/L)    Total Bilirubin 0.2 (*) 0.3 - 1.2 (mg/dL)    GFR calc non Af Amer >90  >90 (mL/min)    GFR calc Af Amer >90  >90 (mL/min)   LIPASE, BLOOD     Status: Normal   Collection Time   05/26/11 10:37 PM      Component Value Range Comment   Lipase 24  11 - 59 (U/L)   PROTIME-INR     Status: Abnormal   Collection Time   05/26/11 10:37 PM      Component Value Range Comment   Prothrombin Time 22.4 (*) 11.6 - 15.2 (seconds)    INR 1.93 (*) 0.00 - 1.49    APTT     Status: Normal   Collection Time   05/26/11 10:37 PM      Component Value Range Comment   aPTT 33  24 - 37 (seconds)   TYPE AND SCREEN     Status: Normal   Collection Time   05/26/11 10:37 PM      Component Value Range Comment   ABO/RH(D) O POS      Antibody Screen NEG      Sample Expiration 05/29/2011      Dg Chest Port 1 View  05/26/2011  *RADIOLOGY REPORT*  Clinical Data: Hemoptysis, nausea, vomiting, history smoking, carcinoma of the colon  PORTABLE CHEST - 1 VIEW  Comparison: Portable exam 2250 hours compared to 05/09/2011  Findings: Left  subclavian power port, tip projecting over SVC. Normal heart size and pulmonary vascularity for technique. Tortuous aorta. Lungs appear mildly emphysematous but clear. No pleural effusion or pneumothorax. No acute osseous findings.  IMPRESSION: No acute abnormalities.  Original Report Authenticated By: Lollie Marrow, M.D.      Assessment Present on Admission:  Gastroenteritis apparently resolving  .GI bleed, by history questionable Mallory-Weiss tear  .Coagulopathy, unclear etiology  . chronic Abdominal  pain, other specified site  history of adenocarcinoma of the colon apparent satisfactory response status post surgery and chemotherapy  History of ongoing therapy for pulmonary embolus    PLAN: We'll admit this gentleman to monitor his cardiovascular status;  give vitamin K now, check his INR in the morning. Will type and screen 2 units, in case transfusion is necessary. Intravenous proton pump inhibitors, and a GI consult. Will consider restarting his Lovenox at a later date, if no evidence of significant bleeders found   Other plans as per orders.   Neriyah Cercone 05/27/2011, 2:16 AM

## 2011-05-27 NOTE — ED Notes (Addendum)
Pt informed of bed assignment. States nausea is ok  & the pain is starting to increase at this time. NAD noted.

## 2011-05-27 NOTE — Consult Note (Signed)
Referring Provider: Kerry Hough Primary Care Physician:  [name], MD, MD Primary Gastroenterologist:  Dr. Jena Gauss  Reason for Consultation:  GI bleed  HPI: [name] is a 61 y.o. male w/ acute onset N/V/D & abdominal pain that started 4 days ago.  Then developed hematemesis yesterday w/ moderate amt bright red blood in emesis.Abdominal pain has subsided now.  Want something to drink.  Wife had similar episode N/V/D but she is better now.  Hgb stable.  Recent admission 1/13 with coagulopathy & MW tear.  Hx adenocarcinoma of colon.  Hx recent PE with lovenox. Did not require transfusion.  Ct showed gaseous distention lg & sm bowel previously.  Last lovenox Tuesday at home. 2/13   *RADIOLOGY REPORT*  Clinical Data: Abdominal distention. Left-sided abdominal pain.  CT ABDOMEN AND PELVIS WITH CONTRAST  Technique: Multidetector CT imaging of the abdomen and pelvis was  performed following the standard protocol during bolus  administration of intravenous contrast.  Contrast: OMNIPAQUE IOHEXOL 300 MG/ML IV SOLN  Comparison: 04/16/2011  Findings: Small hiatal hernia. Heart is mildly enlarged. Scarring  in the right lung base. No effusions.  Prior cholecystectomy. Postoperative changes in the region of the  stomach and proximal small bowel. Surgical changes also near the  splenic flexure of the colon.  There is a nonspecific bowel gas pattern. Mild gaseous distention  of both large and small bowel. No convincing evidence for  obstruction as there is no well-defined transition point. Contrast  has passed into the proximal colon. Liver, spleen, pancreas,  adrenals and left kidney are unremarkable. Benign-appearing cyst  in the mid pole of the right kidney. No hydronephrosis.  Prostate is mildly prominent. Urinary bladder grossly  unremarkable. Aorta and iliac vessels are calcified, non-  aneurysmal.  IMPRESSION:  Mild diffuse gaseous distention of both large and small bowel. No    convincing evidence for obstruction. Oral contrast material has  passed into the colon.  No definite acute process.  Original Report Authenticated By: Cyndie Chime, M.D.   Past Medical History  Diagnosis Date  . 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 Pulmonary embolism 02/2010  . Acid reflux   . Hypertension   . Osteoporosis   . Arthritis   . TIA (transient ischemic attack) 10/11  . ETOH abuse     quit 03/2010  . 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 Personal history of PE (pulmonary embolism) 10/01/2010  . Blood transfusion   . S/P endoscopy September 28, 2010    erosive reflux esophagitis, Billroth I anatomy  . Angina   . Shortness of breath   . Sleep apnea   . Recurrent upper respiratory infection (URI)   . Anxiety   . Pneumonia   . Anemia     Past Surgical History  Procedure Date  . Hernia repair     right inguinal  . Appendectomy 1980s  . Cholecystectomy 1980s  . Colon surgery May 2012    left hemicolectomy, colon cancer found at time of surgery for bowel obstruction  . Portacath placement   . Abdominal sugery     for bowel obstruction x 8, all in 1980s, except for one in 07/2010  . Esophagogastroduodenoscopy 09/28/2010  . Esophagogastroduodenoscopy 12/01/2010    Cervical web status post dilation, erosive esophagitis, B1 hemigastrectomy, inflamed anastomosis  . Colonoscopy 03/18/2011    anastomosis at 35cm. Several adenomatous polyps removed. Sigmoid diverticulosis. Next TCS 02/2013  . Esophagogastroduodenoscopy 04/16/2011  excoriation at [facility] c/w trauma/M-W tear, friable gastric anastomosis, dilation efferent limb  . Billroth 1 hemigastrectomy 1980s    per patient for benign duodenal tumor    Prior to Admission medications   Medication Sig Start Date End Date Taking? Authorizing Provider  dexlansoprazole (DEXILANT) 60 MG capsule Take 1 capsule (60 mg total) by mouth daily. 03/22/11  Yes Tana Coast, PA  enoxaparin (LOVENOX) 60 MG/0.6ML  SOLN Inject 60 mg into the skin at bedtime.   Yes Historical Provider, MD  folic acid (FOLVITE) 1 MG tablet Take 1 mg by mouth daily.     Yes Historical Provider, MD  LORazepam (ATIVAN) 1 MG tablet Take 1 mg by mouth daily as needed. For anxiety 04/12/11  Yes Historical Provider, MD  prochlorperazine (COMPAZINE) 10 MG tablet Take 10 mg by mouth every 6 (six) hours as needed. Nausea and Vomiting   Yes Historical Provider, MD  sertraline (ZOLOFT) 50 MG tablet Take 50 mg by mouth at bedtime.    Yes Historical Provider, MD  thiamine 100 MG tablet Take 100 mg by mouth daily.     Yes Historical Provider, MD  traMADol (ULTRAM) 50 MG tablet Take 50 mg by mouth 4 (four) times daily as needed. For pain 02/03/11  Yes Historical Provider, MD  Valsartan-Hydrochlorothiazide (DIOVAN HCT PO) Take 1 tablet by mouth daily.   Yes Historical Provider, MD  cyanocobalamin (,VITAMIN B-12,) 1000 MCG/ML injection Inject 1,000 mcg into the muscle every 30 (thirty) days.    Historical Provider, MD    Current Facility-Administered Medications  Medication Dose Route Frequency Provider Last Rate Last Dose  . 0.9 %  sodium chloride infusion   Intravenous Once Candis Musa, PA 50 mL/hr at 05/26/11 2311 1,000 mL at 05/26/11 2311  . 0.9 % NaCl with KCl 20 mEq/ L  infusion   Intravenous Continuous [name], MD 100 mL/hr at 05/27/11 0308    . acetaminophen (TYLENOL) tablet 650 mg  650 mg Oral Q6H PRN [name], MD       Or  . acetaminophen (TYLENOL) suppository 650 mg  650 mg Rectal Q6H PRN [name], MD      . bisacodyl (DULCOLAX) suppository 10 mg  10 mg Rectal Daily PRN [name], MD      . HYDROmorphone (DILAUDID) injection 0.5 mg  0.5 mg Intravenous Q2H PRN [name], MD   0.5 mg at 05/27/11 1155  . LORazepam (ATIVAN) injection 0.5-1 mg  0.5-1 mg Intravenous Q6H PRN [name], MD      . morphine 4 MG/ML injection 4 mg  4 mg Intravenous Once Candis Musa, PA   4 mg at 05/26/11 2345  .  morphine 4 MG/ML injection 4 mg  4 mg Intravenous Once Candis Musa, PA   4 mg at 05/27/11 0124  . nicotine (NICODERM CQ - dosed in mg/24 hours) patch 14 mg  14 mg Transdermal Daily [name], MD   14 mg at 05/27/11 0345  . ondansetron (ZOFRAN) injection 4 mg  4 mg Intravenous Once Loren Racer, MD   4 mg at 05/26/11 2252  . ondansetron (ZOFRAN) injection 4 mg  4 mg Intravenous Q4H PRN [name], MD   4 mg at 05/27/11 0916  . pantoprazole (PROTONIX) 80 mg in sodium chloride 0.9 % 100 mL IVPB  80 mg Intravenous Once Loren Racer, MD   80 mg at 05/26/11 2308  . pantoprazole (PROTONIX) 80 mg in sodium chloride 0.9 % 250 mL infusion  8 mg/hr Intravenous Continuous Loren Racer, MD 25 mL/hr at 05/27/11 0952 8 mg/hr at 05/27/11 0952  . phytonadione (VITAMIN K) 10 mg in dextrose 5 % 50 mL IVPB  10 mg Intravenous STAT [name], MD   10 mg at 05/27/11 0338  . prochlorperazine (COMPAZINE) injection 10 mg  10 mg Intravenous Q6H PRN [name], MD      . sodium chloride 0.9 % bolus 1,000 mL  1,000 mL Intravenous Once Loren Racer, MD   1,000 mL at 05/26/11 2252  . DISCONTD: 0.9 %  sodium chloride infusion   Intravenous STAT Candis Musa, PA      . DISCONTD: 0.9 %  sodium chloride infusion   Intravenous STAT Candis Musa, PA      . DISCONTD: ondansetron (ZOFRAN) injection 4 mg  4 mg Intravenous Q8H PRN Candis Musa, PA        Allergies as of 05/26/2011  . (No Known Allergies)   Family History  Problem Relation Age of Onset  . Hypertension Mother   . Arthritis Mother   . Hypertension Father   . Colon cancer Neg Hx    History   Social History  . Marital Status: Married    Spouse Name: N/A    Number of Children: 3  . Years of Education: N/A   Occupational History  .  [employer]   Social History Main Topics  . Smoking status: Current Everyday Smoker -- 0.5 packs/day for 40 years    Types: Cigarettes  . Smokeless tobacco: Not on file  . Alcohol Use: No      last drink was Apr 16 2010  . Drug Use: No  . Sexually Active: Yes  Review of Systems: Gen: Denies any fever,.  C/o malaise & anorexia. CV: Denies chest pain, angina, palpitations, syncope, orthopnea, PND, peripheral edema, and claudication. Resp: Denies dyspnea at rest, dyspnea with exercise, cough, sputum, wheezing, coughing up blood, and pleurisy. GI: Denies jaundice and fecal incontinence.   Denies dysphagia or odynophagia. GU : Denies urinary burning, blood in urine, urinary frequency, urinary hesitancy, nocturnal urination, and urinary incontinence. MS: Denies joint pain, limitation of movement, and swelling, stiffness, low back pain, extremity pain. Denies muscle weakness, cramps, atrophy.  Derm: Denies rash, itching, dry skin, hives, moles, warts, or unhealing ulcers.  Psych: Denies depression, anxiety, memory loss, suicidal ideation, hallucinations, paranoia, and confusion. Heme: Denies bruising, bleeding, and enlarged lymph nodes. Neuro:  Denies any headaches, dizziness, paresthesias. Endo:  Denies any problems with DM, thyroid, adrenal function.  Physical Exam: Vital signs in last 24 hours: Temp:  [97.6 F (36.4 C)-98.3 F (36.8 C)] 98 F (36.7 C) (03/07 1006) Pulse Rate:  [82-115] 87  (03/07 1006) Resp:  [16-20] 18  (03/07 1006) BP: (102-143)/(66-96) 124/76 mmHg (03/07 1006) SpO2:  [96 %-99 %] 98 % (03/07 1006) Weight:  [136 lb 7.4 oz (61.9 kg)-145 lb (65.772 kg)] 136 lb 7.4 oz (61.9 kg) (03/07 0152) Last BM Date: 05/26/11 General:   Alert,  Well-developed, well-nourished, pleasant and cooperative in NAD Head:  Normocephalic and atraumatic. Eyes:  Sclera clear, no icterus.   Conjunctiva pink. Ears:  Normal auditory acuity. Nose:  No deformity, discharge, or lesions. Mouth:  No deformity or lesions,oropharynx pink & moist. Neck:  Supple; no masses or thyromegaly. Lungs:  Clear throughout to auscultation.   No wheezes, crackles, or rhonchi. No acute distress. Heart:   Regular rate and rhythm; no murmurs, clicks, rubs,  or gallops.  Abdomen: Multiple well healed scars. Normal bowel sounds.  No bruits.  Soft, non-tender and non-distended without masses, hepatosplenomegaly or hernias noted.  No guarding or rebound tenderness.   Rectal:  Deferred. Msk:  Symmetrical withounormt gross deformities. Normal posture. Pulses:  Normal pulses noted. Extremities:  No clubbing or edema. Neurologic:  Alert and oriented x4;  grossly normal neurologically. Skin:  Intact without significant lesions or rashes. Lymph Nodes:  No significant cervical adenopathy. Psych:  Alert and cooperative. Normal mood and affect.  Intake/Output from previous day: 03/06 0701 - 03/07 0700 In: 193.3 [I.V.:193.3] Out: 200 [Urine:200]  Lab Results:  Basename 05/27/11 1121 05/27/11 0549 05/26/11 2237  WBC -- 6.4 8.1  HGB 11.4* 11.6*11.5* 13.6  HCT 33.7* 34.5*34.5* 40.1  PLT -- 146* 159   BMET  Basename 05/27/11 0549 05/26/11 2237  NA 140 136  K 3.3* 3.1*  CL 112 106  CO2 20 18*  GLUCOSE 125* 178*  BUN 16 23  CREATININE 0.75 0.86  CALCIUM 8.2* 8.9   LFT  Basename 05/26/11 2237  PROT 6.3  ALBUMIN 3.0*  AST 17  ALT 14  ALKPHOS 83  BILITOT 0.2*  BILIDIR --  IBILI --  LIPASE 24  AMYLASE --   PT/INR  Basename 05/27/11 0549 05/26/11 2237  LABPROT 13.8 22.4*  INR 1.04 1.93*   Studies/Results: Dg Chest Port 1 View  05/26/2011  *RADIOLOGY REPORT*  Clinical Data: Hemoptysis, nausea, vomiting, history smoking, carcinoma of the colon  PORTABLE CHEST - 1 VIEW  Comparison: Portable exam 2250 hours compared to 05/09/2011  Findings: Left subclavian power port, tip projecting over SVC. Normal heart size and pulmonary vascularity for technique. Tortuous aorta. Lungs appear mildly emphysematous but clear. No pleural effusion or pneumothorax. No acute osseous findings.  IMPRESSION: No acute abnormalities.  Original Report Authenticated By: Lollie Marrow, M.D.     Impression: [name] is a pleasant 61 y.o. male w/ hematemesis after 4 days hx N/V/D.  ? Recurrent Mallory Weiss after acute illness vs. Billroth anastomosis inflammation/bleeding vs erosive esophagitis.  Wife w/ similar symptoms suggesting viral illness.  Pt on lovenox secondary to PE at home.  Recent Mallory Weiss tear on last EGD.  Hx Stage 3 adenocarcinoma colon.   HGB stable at this time.  Hypokalemia:  Per hospitalist  Plan: 1. Monitor h/h 2. Agree w/ PPI 3. Antiemetics, supportive measures, IVFs 4. Consider EGD if recurrent hematemesis 5. Carafate QID 6. Clear liquids   LOS: 1 day   Lorenza Burton  05/27/2011, 1:18 PM [facility]   Patient seen and examined;  as above.  Suspect nausea and vomiting once again produced, essentially, a Mallory-Weiss tear scenario in the setting of pre-existing erosive reflux esophagitis and anticoagulation. He likely acquired, along with his wife, gastroenteritis. Hopefully, this will be a self limiting phenomenon. If there is no further evidence of bleeding, I do not feel that he needs an EGD or further diagnostic studies. In fact, if nausea and vomiting rapidly improves, would consider reinstitution of Lovenox in approximately 3-5 days from now.

## 2011-05-28 ENCOUNTER — Inpatient Hospital Stay (HOSPITAL_COMMUNITY): Payer: Federal, State, Local not specified - PPO

## 2011-05-28 DIAGNOSIS — R112 Nausea with vomiting, unspecified: Secondary | ICD-10-CM

## 2011-05-28 DIAGNOSIS — R197 Diarrhea, unspecified: Secondary | ICD-10-CM

## 2011-05-28 DIAGNOSIS — K920 Hematemesis: Secondary | ICD-10-CM

## 2011-05-28 LAB — CBC
MCH: 31.9 pg (ref 26.0–34.0)
Platelets: 144 10*3/uL — ABNORMAL LOW (ref 150–400)
RBC: 3.61 MIL/uL — ABNORMAL LOW (ref 4.22–5.81)

## 2011-05-28 LAB — BASIC METABOLIC PANEL
Calcium: 8.6 mg/dL (ref 8.4–10.5)
GFR calc Af Amer: >90 mL/min (ref >90)
GFR calc non Af Amer: >90 mL/min (ref >90)
Glucose, Bld: 90 mg/dL (ref 70–99)
Sodium: 139 mEq/L (ref 135–145)

## 2011-05-28 MED ORDER — SODIUM CHLORIDE 0.9 % IJ SOLN
10.0000 mL | Freq: Two times a day (BID) | INTRAMUSCULAR | Status: DC
  Administered 2011-05-28: 10 mL
  Administered 2011-05-29: 40 mL

## 2011-05-28 MED ORDER — SODIUM CHLORIDE 0.9 % IJ SOLN
INTRAMUSCULAR | Status: AC
  Filled 2011-05-28: qty 3

## 2011-05-28 MED ORDER — POLYETHYLENE GLYCOL 3350 17 G PO PACK
17.0000 g | PACK | Freq: Two times a day (BID) | ORAL | Status: DC
  Administered 2011-05-28 – 2011-05-29 (×2): 17 g via ORAL
  Filled 2011-05-28 (×2): qty 1

## 2011-05-28 MED ORDER — SODIUM CHLORIDE 0.9 % IJ SOLN
INTRAMUSCULAR | Status: AC
  Administered 2011-05-28: 10 mL
  Filled 2011-05-28: qty 3

## 2011-05-28 MED ORDER — SODIUM CHLORIDE 0.9 % IJ SOLN: 10.0000 mL | INTRAMUSCULAR | Status: DC | PRN

## 2011-05-28 MED ORDER — ONDANSETRON HCL 4 MG/2ML IJ SOLN
4.0000 mg | Freq: Four times a day (QID) | INTRAMUSCULAR | Status: DC
  Administered 2011-05-28 – 2011-05-29 (×4): 4 mg via INTRAVENOUS
  Filled 2011-05-28 (×4): qty 2

## 2011-05-28 NOTE — Progress Notes (Signed)
REVIEWED.  ABD XRAY PENDING

## 2011-05-28 NOTE — Progress Notes (Signed)
Subjective: Had some vomiting last night with streak of blood.  No BM in 2 days.  Passing flatus.  Very nauseous  Objective: Vital signs in last 24 hours: Temp:  [97.6 F (36.4 C)-98.1 F (36.7 C)] 98 F (36.7 C) (03/08 1404) Pulse Rate:  [75-99] 92  (03/08 1404) Resp:  [16-20] 16  (03/08 1404) BP: (109-134)/(70-88) 123/86 mmHg (03/08 1404) SpO2:  [95 %-98 %] 98 % (03/08 1404) Weight:  [65.59 kg (144 lb 9.6 oz)-67.8 kg (149 lb 7.6 oz)] 67.8 kg (149 lb 7.6 oz) (03/08 0549) Weight change: -0.181 kg (-6.4 oz) Last BM Date: 05/26/11  Intake/Output from previous day: 03/07 0701 - 03/08 0700 In: 2857.9 [P.O.:600; I.V.:2257.9] Out: 1700 [Urine:1700] Total I/O In: 600 [P.O.:600] Out: 1450 [Urine:1450]   Physical Exam: General: Alert, awake, oriented x3, in no acute distress. HEENT: No bruits, no goiter. Heart: Regular rate and rhythm, without murmurs, rubs, gallops. Lungs: Clear to auscultation bilaterally. Abdomen: Soft, nontender, nondistended, positive bowel sounds. Extremities: No clubbing cyanosis or edema with positive pedal pulses. Neuro: Grossly intact, nonfocal.    Lab Results: Basic Metabolic Panel:  Basename 05/28/11 0548 05/27/11 0549  NA 139 140  K 4.4 3.3*  CL 109 112  CO2 22 20  GLUCOSE 90 125*  BUN 6 16  CREATININE 0.74 0.75  CALCIUM 8.6 8.2*  MG -- --  PHOS -- --   Liver Function Tests:  [facility] 05/26/11 2237  AST 17  ALT 14  ALKPHOS 83  BILITOT 0.2*  PROT 6.3  ALBUMIN 3.0*    Basename 05/26/11 2237  LIPASE 24  AMYLASE --   No results found for this basename: AMMONIA:2 in the last 72 hours CBC:  Basename 05/28/11 0548 05/27/11 1520 05/27/11 0549 05/26/11 2237  WBC 6.7 -- 6.4 --  NEUTROABS -- -- -- 5.4  HGB 11.5* 12.1* -- --  HCT 33.8* 35.9* -- --  MCV 93.6 -- 93.8 --  PLT 144* -- 146* --   Cardiac Enzymes: No results found for this basename: CKTOTAL:3,CKMB:3,CKMBINDEX:3,TROPONINI:3 in the last 72 hours BNP: No results found  for this basename: PROBNP:3 in the last 72 hours D-Dimer: No results found for this basename: DDIMER:2 in the last 72 hours CBG: No results found for this basename: GLUCAP:6 in the last 72 hours Hemoglobin A1C: No results found for this basename: HGBA1C in the last 72 hours Fasting Lipid Panel: No results found for this basename: CHOL,HDL,LDLCALC,TRIG,CHOLHDL,LDLDIRECT in the last 72 hours Thyroid Function Tests: No results found for this basename: TSH,T4TOTAL,FREET4,T3FREE,THYROIDAB in the last 72 hours Anemia Panel: No results found for this basename: VITAMINB12,FOLATE,FERRITIN,TIBC,IRON,RETICCTPCT in the last 72 hours Coagulation:  Basename 05/27/11 0549 05/26/11 2237  LABPROT 13.8 22.4*  INR 1.04 1.93*   Urine Drug Screen: Drugs of Abuse     Component Value Date/Time   LABOPIA NONE DETECTED 04/09/2010 2319   COCAINSCRNUR NONE DETECTED 04/09/2010 2319   LABBENZ POSITIVE* 04/09/2010 2319   AMPHETMU NONE DETECTED 04/09/2010 2319   THCU NONE DETECTED 04/09/2010 2319   LABBARB  Value: NONE DETECTED        DRUG SCREEN FOR MEDICAL PURPOSES ONLY.  IF CONFIRMATION IS NEEDED FOR ANY PURPOSE, NOTIFY LAB WITHIN 5 DAYS.        LOWEST DETECTABLE LIMITS FOR URINE DRUG SCREEN Drug Class       Cutoff (ng/mL) Amphetamine      1000 Barbiturate      200 Benzodiazepine   200 Tricyclics       300 Opiates  300 Cocaine          300 THC              50 04/09/2010 2319    Alcohol Level: No results found for this basename: ETH:2 in the last 72 hours Urinalysis: No results found for this basename: COLORURINE:2,APPERANCEUR:2,LABSPEC:2,PHURINE:2,GLUCOSEU:2,HGBUR:2,BILIRUBINUR:2,KETONESUR:2,PROTEINUR:2,UROBILINOGEN:2,NITRITE:2,LEUKOCYTESUR:2 in the last 72 hours  No results found for this or any previous visit (from the past 240 hour(s)).  Studies/Results: Dg Chest Port 1 View  05/26/2011  *RADIOLOGY REPORT*  Clinical Data: Hemoptysis, nausea, vomiting, history smoking, carcinoma of the colon  PORTABLE  CHEST - 1 VIEW  Comparison: Portable exam 2250 hours compared to 05/09/2011  Findings: Left subclavian power port, tip projecting over SVC. Normal heart size and pulmonary vascularity for technique. Tortuous aorta. Lungs appear mildly emphysematous but clear. No pleural effusion or pneumothorax. No acute osseous findings.  IMPRESSION: No acute abnormalities.  Original Report Authenticated By: Lollie Marrow, M.D.   Dg Abd 2 Views  05/28/2011  *RADIOLOGY REPORT*  Clinical Data: History of small bowel obstruction.  ABDOMEN - 2 VIEW  Comparison: CT abdomen pelvis 05/13/2011 and chest and two views abdomen 12/04/2010.  Findings: There is marked gaseous distention of the colon.  No small bowel dilatation is seen.  No free intraperitoneal air.  IMPRESSION: Bowel gas pattern most consistent with colonic ileus.  Original Report Authenticated By: Bernadene Bell. Maricela Curet, M.D.    Medications: Scheduled Meds:   . HYDROmorphone      . nicotine  14 mg Transdermal Daily  . ondansetron      . ondansetron  4 mg Intravenous Q6H  . pantoprazole (PROTONIX) IV  40 mg Intravenous Q12H  . sodium chloride      . sodium chloride      . sodium chloride      . sodium chloride      . sodium chloride      . sodium chloride      . sodium chloride      . sucralfate  1 g Oral TID WC & HS   Continuous Infusions:   . 0.9 % NaCl with KCl 20 mEq / L 75 mL/hr at 05/28/11 0241  . DISCONTD: pantoprozole (PROTONIX) infusion 8 mg/hr (05/27/11 0952)   PRN Meds:.acetaminophen, acetaminophen, bisacodyl, HYDROmorphone, LORazepam, prochlorperazine, DISCONTD: ondansetron  Assessment/Plan:  Active Problems:  Abdominal  pain, other specified site  Personal history of PE (pulmonary embolism)  Hematemesis  GI bleed  Coagulopathy  Gastroenteritis  Plan: This gentleman was admitted yet again with vomiting and hematemesis. He reports that his wife has similar nausea and vomiting syndrome which has since resolved. Patient's  hematemesis is felt to be likely secondary to Mallory-Weiss tears. Supportive management and observation have been recommended. Gastroenterology has been following.  1. hematemesis. Felt to be likely secondary to Mallory-Weiss tear. Supportive management is recommended. Hemoglobin is stable. Continue proton pump inhibitors.  2. Vomiting. Possibly related to gastroenteritis. Abdominal x-ray shows colonic ileus. We will restart the patient on his outpatient bowel regimen. He is on clear liquids right now. This can be advanced once his nausea resolves.  3. History of pulmonary embolism. Patient is chronically on Lovenox. This can be restarted 3-5 days once his hematemesis resolves.  4. Disposition. Patient will be discharged home once he is tolerating a solid diet. His hemoglobin currently appears stable. Please be continued on proton pump inhibitors. Anticoagulation will need to be restarted in the next 3-5 days.   LOS: 2 days  Margy Sumler [facility] Pager: [phone] 05/28/2011, 4:15 PM

## 2011-05-28 NOTE — Progress Notes (Signed)
Nicotine patch applied at this time because old patch ordered to be removed at this time.  10:00 am dose not needed per [name] Huger at [facility].  He re-ordered the patch to be applied at 2200, but could not get rid of 10:00 dose on MAR.

## 2011-05-28 NOTE — Progress Notes (Signed)
Subjective:  Patient well-known to me. Seen in office 05/13/11. H/O recurrent SBO with numerous surgeries. When seen in office, he had outside KUB s/o partial SBO and ?wall thickening of bowel in left abdomen. Seen on previous studies as well. CT done urgently. Showed mild gaseous distention of large and small bowel but no obstruction. Patient also with h/o Stage III colon cancer last year s/p resection and chemo.  Last episode of emesis early morning hours. Streaks of red blood. Also with constant nausea. No BM since yesterday. C/O acute on chronic LUQ pain and bloating.   Objective: Vital signs in last 24 hours: Temp:  [97.6 F (36.4 C)-98.3 F (36.8 C)] 98.1 F (36.7 C) (03/08 0522) Pulse Rate:  [75-99] 82  (03/08 0551) Resp:  [18-20] 20  (03/08 0551) BP: (109-134)/(70-88) 134/88 mmHg (03/08 0551) SpO2:  [95 %-98 %] 98 % (03/08 0551) Weight:  [144 lb 9.6 oz (65.59 kg)-149 lb 7.6 oz (67.8 kg)] 149 lb 7.6 oz (67.8 kg) (03/08 0549) Last BM Date: 05/26/11 General:   Alert,  Well-developed, well-nourished, pleasant and cooperative in NAD Head:  Normocephalic and atraumatic. Eyes:  Sclera clear, no icterus.   Abdomen:  Soft, luq tenderness, nondistended. No masses, hepatosplenomegaly or hernias noted. Normal bowel sounds everywhere but little tympanic over the left abdomen, without guarding, and without rebound.   Extremities:  Without clubbing, deformity or edema. Neurologic:  Alert and  oriented x4;  grossly normal neurologically. Skin:  Intact without significant lesions or rashes. Psych:  Alert and cooperative. Normal mood and affect.  Intake/Output from previous day: 03/07 0701 - 03/08 0700 In: 2857.9 [P.O.:600; I.V.:2257.9] Out: 1700 [Urine:1700] Intake/Output this shift:    Lab Results: CBC  Basename 05/28/11 0548 05/27/11 1520 05/27/11 1121 05/27/11 0549 05/26/11 2237  WBC 6.7 -- -- 6.4 8.1  HGB 11.5* 12.1* 11.4* -- --  HCT 33.8* 35.9* 33.7* -- --  MCV 93.6 -- -- 93.8 93.9   PLT 144* -- -- 146* 159   BMET  Basename 05/28/11 0548 05/27/11 0549 05/26/11 2237  NA 139 140 136  K 4.4 3.3* 3.1*  CL 109 112 106  CO2 22 20 18*  GLUCOSE 90 125* 178*  BUN 6 16 23   CREATININE 0.74 0.75 0.86  CALCIUM 8.6 8.2* 8.9      Imaging Studies:   05/26/2011  *RADIOLOGY REPORT*  Clinical Data: Hemoptysis, nausea, vomiting, history smoking, carcinoma of the colon  PORTABLE CHEST - 1 VIEW  Comparison: Portable exam 2250 hours compared to 05/09/2011  Findings: Left subclavian power port, tip projecting over SVC. Normal heart size and pulmonary vascularity for technique. Tortuous aorta. Lungs appear mildly emphysematous but clear. No pleural effusion or pneumothorax. No acute osseous findings.  IMPRESSION: No acute abnormalities.  Original Report Authenticated By: Lollie Marrow, M.D.  [2 weeks]   Assessment: Hematemisis after 4 days of N/V/D. H/H relatively stable. On Lovenox at home for h/o PE. H/O recurrent bowel obstructions and most recently Stage III colon cancer 2012.  Prior EGD 03/2011 for same, (excoriation at [facility] c/w trauma/M-W tear, friable gastric anastomosis, dilation efferent limb).   Patient with persistent nausea/vomiting, left upper quadrant bloating and discomfort.     Plan: 1. Scheduled Zofran. 2. Abd film, 2 view.    LOS: 2 days   Tana Coast  05/28/2011, 8:20 AM

## 2011-05-28 NOTE — ED Provider Notes (Signed)
Medical screening examination/treatment/procedure(s) were performed by non-physician practitioner and as supervising physician I was immediately available for consultation/collaboration.   Loren Racer, MD 05/28/11 [phone]

## 2011-05-29 DIAGNOSIS — R109 Unspecified abdominal pain: Secondary | ICD-10-CM

## 2011-05-29 LAB — BASIC METABOLIC PANEL
BUN: 4 mg/dL — ABNORMAL LOW (ref 6–23)
Chloride: 112 mEq/L (ref 96–112)
GFR calc Af Amer: >90 mL/min (ref >90)
GFR calc non Af Amer: >90 mL/min (ref >90)
Potassium: 4.1 mEq/L (ref 3.5–5.1)
Sodium: 144 mEq/L (ref 135–145)

## 2011-05-29 LAB — CBC
MCHC: 33.7 g/dL (ref 30.0–36.0)
Platelets: 157 10*3/uL (ref 150–400)
RDW: 13.9 % (ref 11.5–15.5)
WBC: 6.9 10*3/uL (ref 4.0–10.5)

## 2011-05-29 MED ORDER — ONDANSETRON HCL 4 MG/2ML IJ SOLN
4.0000 mg | Freq: Three times a day (TID) | INTRAMUSCULAR | Status: DC
  Administered 2011-05-29: 4 mg via INTRAVENOUS
  Filled 2011-05-29: qty 2

## 2011-05-29 MED ORDER — HEPARIN SOD (PORK) LOCK FLUSH 100 UNIT/ML IV SOLN: 500.0000 [IU] | INTRAVENOUS | Status: DC | PRN

## 2011-05-29 MED ORDER — SODIUM CHLORIDE 0.9 % IJ SOLN
INTRAMUSCULAR | Status: AC
  Administered 2011-05-29: 09:00:00
  Filled 2011-05-29: qty 3

## 2011-05-29 MED ORDER — PANTOPRAZOLE SODIUM 40 MG IV SOLR
40.0000 mg | Freq: Two times a day (BID) | INTRAVENOUS | Status: DC
  Filled 2011-05-29: qty 40

## 2011-05-29 MED ORDER — HEPARIN SOD (PORK) LOCK FLUSH 100 UNIT/ML IV SOLN
500.0000 [IU] | INTRAVENOUS | Status: DC
  Administered 2011-05-29: 500 [IU]
  Filled 2011-05-29: qty 5

## 2011-05-29 NOTE — Progress Notes (Signed)
Subjective: Since I last evaluated the patient, HE HAS HAD NO VOMITING. ABD PAIN CONTINUES BUT IMPROVED. KUB SHOWED ILEUS. REQUESTING INCREASE IN PAIN MEDS. PASSING GAS BUT NO BM.  Objective: Vital signs in last 24 hours: Temp:  [97.9 F (36.6 C)-98.6 F (37 C)] 98 F (36.7 C) (03/09 0529) Pulse Rate:  [62-92] 84  (03/09 0712) Resp:  [16-20] 20  (03/09 0529) BP: (103-142)/(69-92) 142/92 mmHg (03/09 0712) SpO2:  [98 %-99 %] 98 % (03/09 0529) Weight:  [149 lb (67.586 kg)] 149 lb (67.586 kg) (03/09 0529) Last BM Date: 05/26/11  Intake/Output from previous day: June 21, 2022 0701 - 03/09 0700 In: 1090 [P.O.:1080; I.V.:10] Out: 2675 [Urine:2675] Intake/Output this shift:    General appearance: alert, cooperative, appears older than stated age and no distress Resp: clear to auscultation bilaterally Cardio: regular rate and rhythm GI: normal findings: bowel sounds normal and abnormal findings:  distended and MILD TO MOD TTP IN LUQ/LLQ W/O REBOUND OR GUARDING  Lab Results:  Basename 05/29/11 0820 06/21/11 0548 05/27/11 1520 05/27/11 0549  WBC 6.9 6.7 -- 6.4  HGB 11.4* 11.5* 12.1* --  HCT 33.8* 33.8* 35.9* --  PLT 157 144* -- 146*   BMET  Basename 05/29/11 0820 2011-06-21 0548 05/27/11 0549  NA 144 139 140  K 4.1 4.4 3.3*  CL 112 109 112  CO2 25 22 20   GLUCOSE 93 90 125*  BUN 4* 6 16  CREATININE 0.77 0.74 0.75  CALCIUM 8.6 8.6 8.2*   LFT  Basename 05/26/11 2237  PROT 6.3  ALBUMIN 3.0*  AST 17  ALT 14  ALKPHOS 83  BILITOT 0.2*  BILIDIR --  IBILI --   PT/INR  Basename 05/27/11 0549 05/26/11 2237  LABPROT 13.8 22.4*  INR 1.04 1.93*   Hepatitis Panel No results found for this basename: HEPBSAG,HCVAB,HEPAIGM,HEPBIGM in the last 72 hours C-Diff No results found for this basename: CDIFFTOX:3 in the last 72 hours Fecal Lactopherrin No results found for this basename: FECLLACTOFRN in the last 72 hours  Studies/Results: Dg Abd 2 Views  2011/06/21  *RADIOLOGY REPORT*   Clinical Data: History of small bowel obstruction.  ABDOMEN - 2 VIEW  Comparison: CT abdomen pelvis 05/13/2011 and chest and two views abdomen 12/04/2010.  Findings: There is marked gaseous distention of the colon.  No small bowel dilatation is seen.  No free intraperitoneal air.  IMPRESSION: Bowel gas pattern most consistent with colonic ileus.  Original Report Authenticated By: Bernadene Bell. Maricela Curet, M.D.    Medications: I have reviewed the patient's current medications.  Assessment/Plan: ADMITTED WITH NAUSEA, VOMITING, & ABDOMINAL PAIN DUE TO ILEUS. LOW VOLUME HEMATEMESIS, HB STABLE-RECENT EGD JAN 2013-GASTRITIS & MW TEAR.  PLAN: 1. ADVANCE DIET 2. EXPLAINED TO PT THAT INCREASING HIS PAIN MEDS WOULD MAKE HIS ILEUS WORSE NOT BETTER. WE ARE TRYING TO TAKE THE EDGE OF THE PAIN W/O CAUSING HIS BOWELS TO BE MORE SLEEPY. 3. CONTINUE ZOFRAN QAC AND BID PPI. 4. ANTICIPATE D/C WITHIN THE NEXT 48 HOURS.  LOS: 3 days   Huntleigh Doolen 05/29/2011, 9:48 AM

## 2011-05-29 NOTE — Discharge Summary (Signed)
Physician Discharge Summary  [name] MRN: [id number] DOB/AGE: [birth year] 61 y.o. Primary Care Physician:DAVIDSON,ERIC, MD, MD Admit date: 05/26/2011 Discharge date: 05/29/2011    Discharge Diagnoses:  1. Gastroenteritis, resolved. 2. Probable Mallory-Weiss tear with no hemodynamic instability and no decrease in hemoglobin. Resolved. 3. 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 4. 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   Medication List  As of 05/29/2011 11:54 AM   TAKE these medications         cyanocobalamin 1000 MCG/ML injection   Commonly known as: (VITAMIN B-12)   Inject 1,000 mcg into the muscle every 30 (thirty) days.      dexlansoprazole 60 MG capsule   Commonly known as: DEXILANT   Take 1 capsule (60 mg total) by mouth daily.      DIOVAN HCT PO   Take 1 tablet by mouth daily.      enoxaparin 60 MG/0.6ML Soln   Commonly known as: LOVENOX   Inject 60 mg into the skin at bedtime.      folic acid 1 MG tablet   Commonly known as: FOLVITE   Take 1 mg by mouth daily.      LORazepam 1 MG tablet   Commonly known as: ATIVAN   Take 1 mg by mouth daily as needed. For anxiety      prochlorperazine 10 MG tablet   Commonly known as: COMPAZINE   Take 10 mg by mouth every 6 (six) hours as needed. Nausea and Vomiting      sertraline 50 MG tablet   Commonly known as: ZOLOFT   Take 50 mg by mouth at bedtime.      thiamine 100 MG tablet   Take 100 mg by mouth daily.      traMADol 50 MG tablet   Commonly known as: ULTRAM   Take 50 mg by mouth 4 (four) times daily as needed. For pain            Discharged Condition: Stable and improved.    Consults: Gastroenterology, Dr. Kendell Bane.  Significant Diagnostic Studies: Dg Chest 2 View  05/09/2011  *RADIOLOGY REPORT*  Clinical Data: Of cough and shortness of breath  CHEST - 2 VIEW  Comparison: Chest radiograph 03/03/2011  Findings: The left subclavian power port  terminates in the proximal superior vena cava, stable.  Borderline cardiomegaly is stable. The pulmonary vascularity is normal.  The lungs are well expanded and clear.  There are no pleural effusions or pneumothorax.  The bones are osteopenic.  No acute bony abnormality is identified. Surgical clips in the upper abdomen.  IMPRESSION: No acute cardiopulmonary disease.  Original Report Authenticated By: Britta Mccreedy, M.D.   Ct Angio Chest W/cm &/or Wo Cm  05/09/2011  *RADIOLOGY REPORT*  Clinical Data: Acute shortness of breath and chest pain.  History of colon cancer and pulmonary embolism.  CT ANGIOGRAPHY CHEST  Technique:  Multidetector CT imaging of the chest using the standard protocol during bolus administration of intravenous contrast. Multiplanar reconstructed images including MIPs were obtained and reviewed to evaluate the vascular anatomy.  Contrast: OMNIPAQUE IOHEXOL 350 MG/ML IV SOLN  Comparison: Chest radiograph 02/17/2013and CT PE study 01/17/2011  Findings: Left chest wall Port-A-Cath is present.  The distal tip of the catheter terminates in the superior vena cava.  Mild cardiomegaly with left ventricular prominence.  Coronary artery atherosclerotic calcification is noted.  The thoracic aorta is normal in caliber and enhancement.  Negative for dissection.  This  is satisfactory evaluation of the pulmonary arterial tree. Central pulmonary arteries are normal in caliber. There are no filling defects in the pulmonary arteries to suggest pulmonary embolism. Specifically, the filling defects seen in the right lower lobe pulmonary arteries on the study of 01/17/2011 have resolved. These pulmonary arteries opacify normally on today's examination.  There is no pleural or pericardial effusion. No lymphadenopathy is identified in the chest.  There is a very thin ostial 1 mm linear filling defect in the right main is 10 bronchus.  This could be a tiny amount of mucus, and was not present on the prior study.   Aside from mild dependent atelectasis., the lungs are clear.  No acute or suspicious bony abnormality.  Mild convex right scoliosis of the upper thoracic spine.  IMPRESSION:  1.  Negative for pulmonary embolism. 2.  Previously identified pulmonary emboli in the right lower lobe on the study of 01/17/2011 have resolved. 2. Question very tiny strand of mucus in the right mainstem bronchus. 3. Coronary artery atherosclerotic calcification and mild of the ventricular prominence.  Original Report Authenticated By: Britta Mccreedy, M.D.   Ct Abdomen Pelvis W Contrast  05/13/2011  *RADIOLOGY REPORT*  Clinical Data: Abdominal distention.  Left-sided abdominal pain.  CT ABDOMEN AND PELVIS WITH CONTRAST  Technique:  Multidetector CT imaging of the abdomen and pelvis was performed following the standard protocol during bolus administration of intravenous contrast.  Contrast: OMNIPAQUE IOHEXOL 300 MG/ML IV SOLN  Comparison: 04/16/2011  Findings: Small hiatal hernia.  Heart is mildly enlarged.  Scarring in the right lung base.  No effusions.  Prior cholecystectomy.  Postoperative changes in the region of the stomach and proximal small bowel.  Surgical changes also near the splenic flexure of the colon.  There is a nonspecific bowel gas pattern.  Mild gaseous distention of both large and small bowel.  No convincing evidence for obstruction as there is no well-defined transition point. Contrast has passed into the proximal colon.  Liver, spleen, pancreas, adrenals and left kidney are unremarkable.  Benign-appearing cyst in the mid pole of the right kidney.  No hydronephrosis.  Prostate is mildly prominent.  Urinary bladder grossly unremarkable.  Aorta and iliac vessels are calcified, non- aneurysmal.  IMPRESSION: Mild diffuse gaseous distention of both large and small bowel.  No convincing evidence for obstruction.  Oral contrast material has passed into the colon.  No definite acute process.  Original Report Authenticated  By: Cyndie Chime, M.D.   Dg Chest Port 1 View  05/26/2011  *RADIOLOGY REPORT*  Clinical Data: Hemoptysis, nausea, vomiting, history smoking, carcinoma of the colon  PORTABLE CHEST - 1 VIEW  Comparison: Portable exam 2250 hours compared to 05/09/2011  Findings: Left subclavian power port, tip projecting over SVC. Normal heart size and pulmonary vascularity for technique. Tortuous aorta. Lungs appear mildly emphysematous but clear. No pleural effusion or pneumothorax. No acute osseous findings.  IMPRESSION: No acute abnormalities.  Original Report Authenticated By: Lollie Marrow, M.D.   Dg Abd 2 Views  05/28/2011  *RADIOLOGY REPORT*  Clinical Data: History of small bowel obstruction.  ABDOMEN - 2 VIEW  Comparison: CT abdomen pelvis 05/13/2011 and chest and two views abdomen 12/04/2010.  Findings: There is marked gaseous distention of the colon.  No small bowel dilatation is seen.  No free intraperitoneal air.  IMPRESSION: Bowel gas pattern most consistent with colonic ileus.  Original Report Authenticated By: Bernadene Bell. Maricela Curet, M.D.    Lab Results: Basic Metabolic Panel:  Basename 05/29/11 0820 05/28/11 0548  NA 144 139  K 4.1 4.4  CL 112 109  CO2 25 22  GLUCOSE 93 90  BUN 4* 6  CREATININE 0.77 0.74  CALCIUM 8.6 8.6  MG -- --  PHOS -- --   Liver Function Tests:  [facility] 05/26/11 2237  AST 17  ALT 14  ALKPHOS 83  BILITOT 0.2*  PROT 6.3  ALBUMIN 3.0*     CBC:  Basename 05/29/11 0820 05/28/11 0548 05/26/11 2237  WBC 6.9 6.7 --  NEUTROABS -- -- 5.4  HGB 11.4* 11.5* --  HCT 33.8* 33.8* --  MCV 93.6 93.6 --  PLT 157 144* --       Hospital Course: This 61 year old man, who is well-known to our service, presented with symptoms of nausea, vomiting, abdominal pain which started 4 days prior to admission. There was also question of hematemesis with moderate amount of red blood. He was admitted to the hospital and treated supportively. He did well. He was seen by  gastroenterology who did not feel that there was any indication of requiring EGD. CT scan of the abdomen showed mild diffuse gaseous distention of both large and small bowel but no convincing evidence of obstruction. Serial hemoglobins have been very stable. He tolerated a advance diet today. He is hemodynamically stable.  Discharge Exam: Blood pressure 142/92, pulse 84, temperature 98 F (36.7 C), temperature source Oral, resp. rate 20, height 5\' 9"  (1.753 m), weight 67.586 kg (149 lb), SpO2 98.00%. He looks systemically well. Heart sounds are present and normal. Abdomen soft nontender. He is alert and orientated.  Disposition: Home. He has been instructed to restart his Lovenox in 3 days time.  Discharge Orders    Future Appointments: Provider: Department: Dept Phone: Center:   07/08/2011 1:00 PM [facility] [phone] None   08/23/2011 10:00 AM [facility] [phone] None   09/02/2011 1:00 PM [facility] [phone] None   10/28/2011 1:00 PM [facility] [phone] None   11/23/2011 10:00 AM [facility] [phone] None   11/23/2011 11:30 AM Ellouise Newer, [facility] [phone] None   02/22/2012 10:00 AM [facility] [phone] None     Future Orders Please Complete By Expires   Diet - low sodium heart healthy      Increase activity slowly      Discharge instructions      Comments:   Restart the Lovenox in 3 days.      Follow-up Information    Follow up with [facility], [place] .         SignedWilson Singer Pager [phone]  05/29/2011, 11:54 AM

## 2011-05-30 ENCOUNTER — Emergency Department (HOSPITAL_COMMUNITY): Payer: Federal, State, Local not specified - PPO

## 2011-05-30 ENCOUNTER — Inpatient Hospital Stay (HOSPITAL_COMMUNITY)
Admission: EM | Admit: 2011-05-30 | Discharge: 2011-06-01 | DRG: 180 | Disposition: A | Discharge status: Home / Self Care | Payer: Federal, State, Local not specified - PPO | Attending: Internal Medicine | Admitting: Internal Medicine

## 2011-05-30 ENCOUNTER — Encounter (HOSPITAL_COMMUNITY): Payer: Self-pay

## 2011-05-30 DIAGNOSIS — F411 Generalized anxiety disorder: Secondary | ICD-10-CM | POA: Diagnosis present

## 2011-05-30 DIAGNOSIS — M129 Arthropathy, unspecified: Secondary | ICD-10-CM | POA: Diagnosis present

## 2011-05-30 DIAGNOSIS — K219 Gastro-esophageal reflux disease without esophagitis: Secondary | ICD-10-CM | POA: Diagnosis present

## 2011-05-30 DIAGNOSIS — K560 Paralytic ileus: Principal | ICD-10-CM | POA: Diagnosis present

## 2011-05-30 DIAGNOSIS — Z86711 Personal history of pulmonary embolism: Secondary | ICD-10-CM

## 2011-05-30 DIAGNOSIS — K56600 Partial intestinal obstruction, unspecified as to cause: Secondary | ICD-10-CM

## 2011-05-30 DIAGNOSIS — Z8673 Personal history of transient ischemic attack (TIA), and cerebral infarction without residual deficits: Secondary | ICD-10-CM

## 2011-05-30 DIAGNOSIS — R109 Unspecified abdominal pain: Secondary | ICD-10-CM | POA: Diagnosis present

## 2011-05-30 DIAGNOSIS — Z903 Acquired absence of stomach [part of]: Secondary | ICD-10-CM

## 2011-05-30 DIAGNOSIS — M810 Age-related osteoporosis without current pathological fracture: Secondary | ICD-10-CM | POA: Diagnosis present

## 2011-05-30 DIAGNOSIS — C189 Malignant neoplasm of colon, unspecified: Secondary | ICD-10-CM | POA: Diagnosis present

## 2011-05-30 DIAGNOSIS — K529 Noninfective gastroenteritis and colitis, unspecified: Secondary | ICD-10-CM | POA: Diagnosis present

## 2011-05-30 DIAGNOSIS — I10 Essential (primary) hypertension: Secondary | ICD-10-CM | POA: Diagnosis present

## 2011-05-30 DIAGNOSIS — Z79899 Other long term (current) drug therapy: Secondary | ICD-10-CM

## 2011-05-30 DIAGNOSIS — F17200 Nicotine dependence, unspecified, uncomplicated: Secondary | ICD-10-CM | POA: Diagnosis present

## 2011-05-30 DIAGNOSIS — K5289 Other specified noninfective gastroenteritis and colitis: Secondary | ICD-10-CM | POA: Diagnosis present

## 2011-05-30 DIAGNOSIS — Z7901 Long term (current) use of anticoagulants: Secondary | ICD-10-CM

## 2011-05-30 HISTORY — DX: Ileus, unspecified: K56.7

## 2011-05-30 LAB — TYPE AND SCREEN
ABO/RH(D): O POS
Unit division: 0

## 2011-05-30 MED ORDER — HYDROMORPHONE HCL PF 1 MG/ML IJ SOLN
1.0000 mg | Freq: Once | INTRAMUSCULAR | Status: AC
  Administered 2011-05-31: 1 mg via INTRAVENOUS
  Filled 2011-05-30: qty 1

## 2011-05-30 MED ORDER — SODIUM CHLORIDE 0.9 % IV SOLN
INTRAVENOUS | Status: DC
  Administered 2011-05-31: via INTRAVENOUS

## 2011-05-30 MED ORDER — ONDANSETRON HCL 4 MG/2ML IJ SOLN
4.0000 mg | Freq: Once | INTRAMUSCULAR | Status: AC
  Administered 2011-05-31: 4 mg via INTRAVENOUS
  Filled 2011-05-30: qty 2

## 2011-05-30 NOTE — ED Notes (Signed)
Pt discharged from inpatient (AP)  Yesterday after treatment for ileus.  Pt states he is still having a lot of pain and is vomiting.

## 2011-05-31 ENCOUNTER — Emergency Department (HOSPITAL_COMMUNITY): Payer: Federal, State, Local not specified - PPO

## 2011-05-31 ENCOUNTER — Encounter (HOSPITAL_COMMUNITY): Payer: Self-pay | Admitting: *Deleted

## 2011-05-31 DIAGNOSIS — K56600 Partial intestinal obstruction, unspecified as to cause: Secondary | ICD-10-CM | POA: Diagnosis present

## 2011-05-31 LAB — COMPREHENSIVE METABOLIC PANEL
ALT: 10 U/L (ref 0–53)
Albumin: 3.4 g/dL — ABNORMAL LOW (ref 3.5–5.2)
Alkaline Phosphatase: 99 U/L (ref 39–117)
BUN: 7 mg/dL (ref 6–23)
Chloride: 102 mEq/L (ref 96–112)
GFR calc Af Amer: >90 mL/min (ref >90)
Glucose, Bld: 110 mg/dL — ABNORMAL HIGH (ref 70–99)
Potassium: 3.7 mEq/L (ref 3.5–5.1)
Sodium: 139 mEq/L (ref 135–145)
Total Bilirubin: 0.2 mg/dL — ABNORMAL LOW (ref 0.3–1.2)
Total Protein: 6.3 g/dL (ref 6.0–8.3)

## 2011-05-31 LAB — CBC
HCT: 37.9 % — ABNORMAL LOW (ref 39.0–52.0)
Hemoglobin: 12.8 g/dL — ABNORMAL LOW (ref 13.0–17.0)
MCHC: 33.8 g/dL (ref 30.0–36.0)
WBC: 9.1 10*3/uL (ref 4.0–10.5)

## 2011-05-31 MED ORDER — POTASSIUM CHLORIDE IN NACL 20-0.9 MEQ/L-% IV SOLN
INTRAVENOUS | Status: DC
  Administered 2011-05-31: 50 mL/h via INTRAVENOUS
  Administered 2011-05-31: 07:00:00 via INTRAVENOUS

## 2011-05-31 MED ORDER — PANTOPRAZOLE SODIUM 40 MG IV SOLR
40.0000 mg | Freq: Every day | INTRAVENOUS | Status: DC
  Administered 2011-05-31: 40 mg via INTRAVENOUS
  Filled 2011-05-31 (×2): qty 40

## 2011-05-31 MED ORDER — ONDANSETRON HCL 4 MG PO TABS
4.0000 mg | ORAL_TABLET | Freq: Four times a day (QID) | ORAL | Status: DC | PRN
  Administered 2011-05-31 – 2011-06-01 (×2): 4 mg via ORAL
  Filled 2011-05-31 (×2): qty 1

## 2011-05-31 MED ORDER — ASPIRIN EC 81 MG PO TBEC
81.0000 mg | DELAYED_RELEASE_TABLET | Freq: Every day | ORAL | Status: DC
  Administered 2011-05-31 – 2011-06-01 (×2): 81 mg via ORAL
  Filled 2011-05-31 (×2): qty 1

## 2011-05-31 MED ORDER — HYDROMORPHONE HCL PF 1 MG/ML IJ SOLN
1.0000 mg | Freq: Once | INTRAMUSCULAR | Status: AC
  Administered 2011-05-31: 1 mg via INTRAVENOUS
  Filled 2011-05-31: qty 1

## 2011-05-31 MED ORDER — IOHEXOL 300 MG/ML  SOLN
100.0000 mL | Freq: Once | INTRAMUSCULAR | Status: AC | PRN
  Administered 2011-05-31: 100 mL via INTRAVENOUS

## 2011-05-31 MED ORDER — LIDOCAINE HCL (CARDIAC) 20 MG/ML IV SOLN
INTRAVENOUS | Status: AC
  Administered 2011-05-31: 5 mL
  Filled 2011-05-31: qty 5

## 2011-05-31 MED ORDER — ONDANSETRON HCL 4 MG/2ML IJ SOLN
4.0000 mg | Freq: Once | INTRAMUSCULAR | Status: AC
  Administered 2011-05-31: 4 mg via INTRAVENOUS

## 2011-05-31 MED ORDER — ACETAMINOPHEN 650 MG RE SUPP: 650.0000 mg | Freq: Four times a day (QID) | RECTAL | Status: DC | PRN

## 2011-05-31 MED ORDER — ACETAMINOPHEN 325 MG PO TABS
650.0000 mg | ORAL_TABLET | Freq: Four times a day (QID) | ORAL | Status: DC | PRN
  Administered 2011-06-01: 650 mg via ORAL
  Filled 2011-05-31: qty 2

## 2011-05-31 MED ORDER — HYDROMORPHONE HCL PF 1 MG/ML IJ SOLN: 0.5000 mg | INTRAMUSCULAR | Status: DC | PRN

## 2011-05-31 MED ORDER — ONDANSETRON HCL 4 MG/2ML IJ SOLN
4.0000 mg | Freq: Four times a day (QID) | INTRAMUSCULAR | Status: DC | PRN
  Administered 2011-05-31: 4 mg via INTRAVENOUS
  Filled 2011-05-31: qty 2

## 2011-05-31 MED ORDER — IOHEXOL 300 MG/ML  SOLN: 40.0000 mL | Freq: Once | INTRAMUSCULAR | Status: DC | PRN

## 2011-05-31 MED ORDER — ONDANSETRON HCL 4 MG/2ML IJ SOLN
INTRAMUSCULAR | Status: AC
  Filled 2011-05-31: qty 2

## 2011-05-31 MED ORDER — HYDROMORPHONE HCL PF 1 MG/ML IJ SOLN
INTRAMUSCULAR | Status: AC
  Administered 2011-05-31: 0.5 mg
  Filled 2011-05-31: qty 1

## 2011-05-31 MED ORDER — ENOXAPARIN SODIUM 60 MG/0.6ML ~~LOC~~ SOLN
60.0000 mg | Freq: Every day | SUBCUTANEOUS | Status: DC
  Administered 2011-05-31: 60 mg via SUBCUTANEOUS
  Filled 2011-05-31: qty 0.6

## 2011-05-31 NOTE — ED Provider Notes (Signed)
History     CSN: [id number]  Arrival date & time 05/30/11  2255   First MD Initiated Contact with Patient 05/30/11 2332      Chief Complaint  Patient presents with  . Abdominal Pain  . Emesis    (Consider location/radiation/quality/duration/timing/severity/associated sxs/prior treatment) Patient is a 61 y.o. male presenting with abdominal pain and vomiting. The history is provided by the patient.  Abdominal Pain The primary symptoms of the illness include abdominal pain and vomiting. The primary symptoms of the illness do not include fever, shortness of breath or dysuria. The current episode started 3 to 5 hours ago. The onset of the illness was gradual. The problem has not changed since onset. The abdominal pain began 3 to 5 hours ago. The pain came on gradually. The abdominal pain has been unchanged since its onset. The abdominal pain is generalized. The abdominal pain does not radiate. The severity of the abdominal pain is 9/10. The abdominal pain is relieved by nothing. The abdominal pain is exacerbated by eating.  The illness is associated with a recent illness. The patient has had a change in bowel habit. Risk factors for an acute abdominal problem include a history of abdominal surgery. Additional symptoms associated with the illness include anorexia. Symptoms associated with the illness do not include chills or back pain.  Emesis  Associated symptoms include abdominal pain. Pertinent negatives include no chills, no fever and no headaches.   H/o SBO and multiple surgeries in the past. Current colon cancer - last chemo Nov 2012 Past Medical History  Diagnosis Date  . 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 Pulmonary embolism 02/2010  . Acid reflux   . Hypertension   . Osteoporosis   . Arthritis   . TIA (transient ischemic attack) 10/11  . ETOH abuse     quit 03/2010  . 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 Personal history of PE (pulmonary embolism)  10/01/2010  . Blood transfusion   . S/P endoscopy September 28, 2010    erosive reflux esophagitis, Billroth I anatomy  . Angina   . Shortness of breath   . Sleep apnea   . Recurrent upper respiratory infection (URI)   . Anxiety   . Pneumonia   . Anemia   . Ileus     Past Surgical History  Procedure Date  . Hernia repair     right inguinal  . Appendectomy 1980s  . Cholecystectomy 1980s  . Colon surgery May 2012    left hemicolectomy, colon cancer found at time of surgery for bowel obstruction  . Portacath placement   . Abdominal sugery     for bowel obstruction x 8, all in 1980s, except for one in 07/2010  . Esophagogastroduodenoscopy 09/28/2010  . Esophagogastroduodenoscopy 12/01/2010    Cervical web status post dilation, erosive esophagitis, B1 hemigastrectomy, inflamed anastomosis  . Colonoscopy 03/18/2011    anastomosis at 35cm. Several adenomatous polyps removed. Sigmoid diverticulosis. Next TCS 02/2013  . Esophagogastroduodenoscopy 04/16/2011    excoriation at [facility] c/w trauma/M-W tear, friable gastric anastomosis, dilation efferent limb  . 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 Hypertension Mother   . Arthritis Mother   . Hypertension Father   . Colon cancer Neg Hx     History  Substance Use Topics  . Smoking status: Current Everyday Smoker -- 0.5 packs/day for  40 years    Types: Cigarettes  . Smokeless tobacco: Not on file  . Alcohol Use: No     last drink was Apr 16 2010      Review of Systems  Constitutional: Negative for fever and chills.  HENT: Negative for neck pain and neck stiffness.   Eyes: Negative for pain.  Respiratory: Negative for shortness of breath.   Cardiovascular: Negative for chest pain.  Gastrointestinal: Positive for vomiting, abdominal pain and anorexia.  Genitourinary: Negative for dysuria.  Musculoskeletal: Negative for back pain.  Skin: Negative for  rash.  Neurological: Negative for headaches.  All other systems reviewed and are negative.    Allergies  Review of patient's allergies indicates no known allergies.  Home Medications  No current outpatient prescriptions on file.  BP 136/77  Pulse 88  Temp(Src) 98.3 F (36.8 C) (Oral)  Resp 16  Ht 5\' 9"  (1.753 m)  Wt 145 lb (65.772 kg)  BMI 21.41 kg/m2  SpO2 93%  Physical Exam  Constitutional: He is oriented to person, place, and time. He appears well-developed and well-nourished.  HENT:  Head: Normocephalic and atraumatic.  Eyes: Conjunctivae and EOM are normal. Pupils are equal, round, and reactive to light.  Neck: Trachea normal. Neck supple. No thyromegaly present.  Cardiovascular: Normal rate, regular rhythm, S1 normal, S2 normal and normal pulses.     No systolic murmur is present   No diastolic murmur is present  Pulses:      Radial pulses are 2+ on the right side, and 2+ on the left side.  Pulmonary/Chest: Effort normal and breath sounds normal. He has no wheezes. He has no rhonchi. He has no rales. He exhibits no tenderness.  Abdominal: Normal appearance. He exhibits distension. There is tenderness. There is guarding. There is no CVA tenderness and negative Murphy's sign.       multiple surgical scars, dec bowel sounds  Musculoskeletal:       BLE:s Calves nontender, no cords or erythema, negative Homans sign  Neurological: He is alert and oriented to person, place, and time. He has normal strength. No cranial nerve deficit or sensory deficit. GCS eye subscore is 4. GCS verbal subscore is 5. GCS motor subscore is 6.  Skin: Skin is warm and dry. No rash noted. He is not diaphoretic.  Psychiatric: His speech is normal.       Cooperative and appropriate    ED Course  Procedures (including critical care time)  Labs Reviewed  CBC - Abnormal; Notable for the following:    RBC 4.10 (*)    Hemoglobin 12.8 (*)    HCT 37.9 (*)    All other components within normal  limits  COMPREHENSIVE METABOLIC PANEL - Abnormal; Notable for the following:    Glucose, Bld 110 (*)    Albumin 3.4 (*)    Total Bilirubin 0.2 (*)    All other components within normal limits  LIPASE, BLOOD  CLOSTRIDIUM DIFFICILE BY PCR  COMPREHENSIVE METABOLIC PANEL  CBC  COMPREHENSIVE METABOLIC PANEL   Ct Abdomen Pelvis W Contrast  05/31/2011  *RADIOLOGY REPORT*  Clinical Data: Abdominal pain  CT ABDOMEN AND PELVIS WITH CONTRAST  Technique:  Multidetector CT imaging of the abdomen and pelvis was performed following the standard protocol during bolus administration of intravenous contrast.  Contrast: OMNIPAQUE IOHEXOL 300 MG/ML IJ SOLN  Comparison: 05/13/2011  Findings: Limited images through the lung bases demonstrate no significant appreciable abnormality. The heart size is within normal limits. No pleural  or pericardial effusion.  Subcentimeter hypodensity within segment four.  Otherwise homogeneous enhancement of the liver and spleen.  Status post cholecystectomy.  There is mild CBD prominence, measuring up to 9 mm.  Tapers to the level of the ampulla.  No obstructing lesion is identified.  Similarly, the main pancreatic duct is mildly prominent at 3.5 mm.  No obstructing lesion identified.  No infiltrative or solid pancreatic mass identified.  Unremarkable adrenal glands.  Multiple renal cysts and too small to further characterize hypodensities.  Tiny nonobstructing stone within the lower pole of the right kidney.  Scarring involving the upper pole of the left kidney.  No hydronephrosis or hydroureter.  Similar to prior, there is gaseous distension of both large and small bowel without a definitive obstruction.  No free intraperitoneal air or fluid.  No lymphadenopathy. There are surgical clips within the left upper quadrant and along the stomach.  Thin-walled bladder.  Prostatomegaly.  There is scattered atherosclerotic calcification of the aorta and its branches. No aneurysmal dilatation.   Osteopenia.  Small fat containing right inguinal hernia.  IMPRESSION: Similar to prior, distension of large and small bowel without an obstructing point identified.  This may represent ileus or partial obstruction pattern.  CBD dilatation up to 9 mm status post cholecystectomy. Nonspecific.  Correlate with LFTs and ERCP / MRCP if clinically warranted.  Original Report Authenticated By: Waneta Martins, M.D.   Dg Abd Acute W/chest  05/31/2011  *RADIOLOGY REPORT*  Clinical Data: Abdominal pain  ACUTE ABDOMEN SERIES (ABDOMEN 2 VIEW & CHEST 1 VIEW)  Comparison: 05/28/2011  Findings: Left subclavian Port-A-Cath with its tip in the SVC. Normal heart size.  Clear lungs.  There is no free intraperitoneal gas.  Colonic distention is stable.  There is increasing distention of small bowel loops within the central abdomen. Air-fluid levels in both small and large bowel are noted.  Stool is present in the descending colon. Postoperative changes from bowel resection and anastomosis are noted.  IMPRESSION: Prominent stool in the descending colon.  Proximal colon and small bowel are dilated with air-fluid levels. There is no free intraperitoneal gas.  Original Report Authenticated By: Donavan Burnet, M.D.     1. Partial small bowel obstruction     NGT and pain control. D/w Dr Orvan Falconer who agrees to ED eval and admit. Bed request placed.   MDM  ABD pain with A/F levels on xray and clinical concern for SBO. CT and labs as above. MED c/s for admit.         Sunnie Nielsen, MD 05/31/11 1806

## 2011-05-31 NOTE — H&P (Signed)
PCP:   DAVIDSON,ERIC, MD, MD   Chief Complaint:  Abdominal pain and distention yesterday  HPI: [name] is an 61 y.o. male.   Discharged from this facility one day ago after treatment for hematemesis associated with gastroenteritis. Please refer to recent discharge summary. Status post surgery and chemotherapy for colon cancer; recurrent admissions for abdominal pain. On Lovenox therapy for pulmonary embolus to be completed in April. Reports  nausea vomiting and diarrhea, restarted after his recent, he began to have abdominal distention and worsening abdominal pain he came to the emergency room where a CT scan of the abdomen showed diffuse bowel dilation, and G-tube was passed and 500 cc of fluid was suction. Hospitalist s service was called to admit for partial small bowel obstruction.  He denies fever cough or cold denies any further hematemesis or passage of black or bloody stool. He reports about 4 loose stools per day, and vomiting about 4 times each day.  Rewiew of Systems:  The patient denies anorexia, fever,  vision loss, decreased hearing, hoarseness, chest pain, syncope, dyspnea on exertion, peripheral edema, balance deficits, hemoptysis, melena, hematochezia, severe indigestion/heartburn, hematuria, incontinence, genital sores, muscle weakness, suspicious skin lesions, transient blindness, difficulty walking, unusual weight change, abnormal bleeding, enlarged lymph nodes, angioedema, and breast masses.    Past Medical History  Diagnosis Date  . 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 Pulmonary embolism 02/2010  . Acid reflux   . Hypertension   . Osteoporosis   . Arthritis   . TIA (transient ischemic attack) 10/11  . ETOH abuse     quit 03/2010  . 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 Personal history of PE (pulmonary embolism) 10/01/2010  . Blood transfusion   . S/P endoscopy September 28, 2010    erosive reflux esophagitis, Billroth I anatomy  .  Angina   . Shortness of breath   . Sleep apnea   . Recurrent upper respiratory infection (URI)   . Anxiety   . Pneumonia   . Anemia   . Ileus     Past Surgical History  Procedure Date  . Hernia repair     right inguinal  . Appendectomy 1980s  . Cholecystectomy 1980s  . Colon surgery May 2012    left hemicolectomy, colon cancer found at time of surgery for bowel obstruction  . Portacath placement   . Abdominal sugery     for bowel obstruction x 8, all in 1980s, except for one in 07/2010  . Esophagogastroduodenoscopy 09/28/2010  . Esophagogastroduodenoscopy 12/01/2010    Cervical web status post dilation, erosive esophagitis, B1 hemigastrectomy, inflamed anastomosis  . Colonoscopy 03/18/2011    anastomosis at 35cm. Several adenomatous polyps removed. Sigmoid diverticulosis. Next TCS 02/2013  . Esophagogastroduodenoscopy 04/16/2011    excoriation at [facility] c/w trauma/M-W tear, friable gastric anastomosis, dilation efferent limb  . Billroth 1 hemigastrectomy 1980s    per patient for benign duodenal tumor    Medications:  HOME MEDS: Prior to Admission medications   Medication Sig Start Date End Date Taking? Authorizing Provider  cyanocobalamin (,VITAMIN B-12,) 1000 MCG/ML injection Inject 1,000 mcg into the muscle every 30 (thirty) days.    Historical Provider, MD  dexlansoprazole (DEXILANT) 60 MG capsule Take 1 capsule (60 mg total) by mouth daily. 03/22/11   Tiffany Kocher, PA  enoxaparin (LOVENOX) 60 MG/0.6ML SOLN Inject 60 mg into the skin at bedtime.    Historical Provider, MD  folic acid (FOLVITE) 1  MG tablet Take 1 mg by mouth daily.      Historical Provider, MD  LORazepam (ATIVAN) 1 MG tablet Take 1 mg by mouth daily as needed. For anxiety 04/12/11   Historical Provider, MD  prochlorperazine (COMPAZINE) 10 MG tablet Take 10 mg by mouth every 6 (six) hours as needed. Nausea and Vomiting    Historical Provider, MD  sertraline (ZOLOFT) 50 MG tablet Take 50 mg by mouth at bedtime.      Historical Provider, MD  thiamine 100 MG tablet Take 100 mg by mouth daily.      Historical Provider, MD  traMADol (ULTRAM) 50 MG tablet Take 50 mg by mouth 4 (four) times daily as needed. For pain 02/03/11   Historical Provider, MD  Valsartan-Hydrochlorothiazide (DIOVAN HCT PO) Take 1 tablet by mouth daily.    Historical Provider, MD     Allergies:  No Known Allergies  Social History:   reports that he has been smoking Cigarettes.  He has a 20 pack-year smoking history. He does not have any smokeless tobacco history on file. He reports that he does not drink alcohol or use illicit drugs.  Family History: Family History  Problem Relation Age of Onset  . Hypertension Mother   . Arthritis Mother   . Hypertension Father   . Colon cancer Neg Hx      Physical Exam: Filed Vitals:   05/30/11 2315 05/31/11 0308 05/31/11 0349 05/31/11 0536  BP: 133/80 147/79 143/78 147/89  Pulse: 88 97 96 86  Temp: 98.3 F (36.8 C)   98.2 F (36.8 C)  TempSrc: Oral   Oral  Resp: 16 18 14 18   Height: 5\' 9"  (1.753 m)   5\' 9"  (1.753 m)  Weight: 65.772 kg (145 lb)     SpO2: 99% 97% 98% 96%   Blood pressure 147/89, pulse 86, temperature 98.2 F (36.8 C), temperature source Oral, resp. rate 18, height 5\' 9"  (1.753 m), weight 65.772 kg (145 lb), SpO2 96.00%.  GEN:  Pleasant middle-aged Caucasian man lying in the stretcher in no acute distress; cooperative with exam PSYCH:  alert and oriented x4; does not appear anxious does not appear depressed;  HEENT: Mucous membranes pink and anicteric; PERRLA; EOM intact; no cervical lymphadenopathy nor thyromegaly or carotid bruit; no JVD; Breasts:: Not examined CHEST WALL: No tenderness CHEST: Normal respiration, clear to auscultation bilaterally HEART: Regular rate and rhythm; no murmurs rubs or gallops BACK: No kyphosis or scoliosis; no CVA tenderness ABDOMEN: , Mild distention left flank tenderness, tympanitic;; no masses, no organomegaly, increase abdominal  bowel sounds; no pannus; no intertriginous candida. Rectal Exam: Not done EXTREMITIES:  age-appropriate arthropathy of the hands and knees; no edema; no ulcerations. Genitalia: not examined PULSES: 2+ and symmetric SKIN: Normal hydration no rash or ulceration CNS: Cranial nerves 2-12 grossly intact no focal neurologic deficit   Labs & Imaging Results for orders placed during the hospital encounter of 05/30/11 (from the past 48 hour(s))  CBC     Status: Abnormal   Collection Time   05/30/11 11:46 PM      Component Value Range Comment   WBC 9.1  4.0 - 10.5 (K/uL)    RBC 4.10 (*) 4.22 - 5.81 (MIL/uL)    Hemoglobin 12.8 (*) 13.0 - 17.0 (g/dL)    HCT 98.1 (*) 19.1 - 52.0 (%)    MCV 92.4  78.0 - 100.0 (fL)    MCH 31.2  26.0 - 34.0 (pg)    MCHC 33.8  30.0 -  36.0 (g/dL)    RDW 45.4  09.8 - 11.9 (%)    Platelets 183  150 - 400 (K/uL)   COMPREHENSIVE METABOLIC PANEL     Status: Abnormal   Collection Time   05/30/11 11:46 PM      Component Value Range Comment   Sodium 139  135 - 145 (mEq/L)    Potassium 3.7  3.5 - 5.1 (mEq/L)    Chloride 102  96 - 112 (mEq/L)    CO2 26  19 - 32 (mEq/L)    Glucose, Bld 110 (*) 70 - 99 (mg/dL)    BUN 7  6 - 23 (mg/dL)    Creatinine, Ser 1.47  0.50 - 1.35 (mg/dL)    Calcium 8.9  8.4 - 10.5 (mg/dL)    Total Protein 6.3  6.0 - 8.3 (g/dL)    Albumin 3.4 (*) 3.5 - 5.2 (g/dL)    AST 13  0 - 37 (U/L)    ALT 10  0 - 53 (U/L)    Alkaline Phosphatase 99  39 - 117 (U/L)    Total Bilirubin 0.2 (*) 0.3 - 1.2 (mg/dL)    GFR calc non Af Amer >90  >90 (mL/min)    GFR calc Af Amer >90  >90 (mL/min)   LIPASE, BLOOD     Status: Normal   Collection Time   05/30/11 11:46 PM      Component Value Range Comment   Lipase 57  11 - 59 (U/L)    Ct Abdomen Pelvis W Contrast  05/31/2011  *RADIOLOGY REPORT*  Clinical Data: Abdominal pain  CT ABDOMEN AND PELVIS WITH CONTRAST  Technique:  Multidetector CT imaging of the abdomen and pelvis was performed following the standard  protocol during bolus administration of intravenous contrast.  Contrast: OMNIPAQUE IOHEXOL 300 MG/ML IJ SOLN  Comparison: 05/13/2011  Findings: Limited images through the lung bases demonstrate no significant appreciable abnormality. The heart size is within normal limits. No pleural or pericardial effusion.  Subcentimeter hypodensity within segment four.  Otherwise homogeneous enhancement of the liver and spleen.  Status post cholecystectomy.  There is mild CBD prominence, measuring up to 9 mm.  Tapers to the level of the ampulla.  No obstructing lesion is identified.  Similarly, the main pancreatic duct is mildly prominent at 3.5 mm.  No obstructing lesion identified.  No infiltrative or solid pancreatic mass identified.  Unremarkable adrenal glands.  Multiple renal cysts and too small to further characterize hypodensities.  Tiny nonobstructing stone within the lower pole of the right kidney.  Scarring involving the upper pole of the left kidney.  No hydronephrosis or hydroureter.  Similar to prior, there is gaseous distension of both large and small bowel without a definitive obstruction.  No free intraperitoneal air or fluid.  No lymphadenopathy. There are surgical clips within the left upper quadrant and along the stomach.  Thin-walled bladder.  Prostatomegaly.  There is scattered atherosclerotic calcification of the aorta and its branches. No aneurysmal dilatation.  Osteopenia.  Small fat containing right inguinal hernia.  IMPRESSION: Similar to prior, distension of large and small bowel without an obstructing point identified.  This may represent ileus or partial obstruction pattern.  CBD dilatation up to 9 mm status post cholecystectomy. Nonspecific.  Correlate with LFTs and ERCP / MRCP if clinically warranted.  Original Report Authenticated By: Waneta Martins, M.D.   Dg Abd Acute W/chest  05/31/2011  *RADIOLOGY REPORT*  Clinical Data: Abdominal pain  ACUTE ABDOMEN SERIES (ABDOMEN 2  VIEW & CHEST  1 VIEW)  Comparison: 05/28/2011  Findings: Left subclavian Port-A-Cath with its tip in the SVC. Normal heart size.  Clear lungs.  There is no free intraperitoneal gas.  Colonic distention is stable.  There is increasing distention of small bowel loops within the central abdomen. Air-fluid levels in both small and large bowel are noted.  Stool is present in the descending colon. Postoperative changes from bowel resection and anastomosis are noted.  IMPRESSION: Prominent stool in the descending colon.  Proximal colon and small bowel are dilated with air-fluid levels. There is no free intraperitoneal gas.  Original Report Authenticated By: Donavan Burnet, M.D.      Assessment Present on Admission:  .Partial small bowel obstruction versus ileus due to a chronic  . chronic Abdominal  pain,  .Gastroenteritis .Adenocarcinoma of colon with mucinous features; is post 7 cycles of chemotherapy   PLAN: Will continue gastric suction via NG tube; Continuous IV fluids with potassium replacement; Pain medications as needed; Monitor for diarrhea and get a C. difficile PCR if not done within the past 3 days  Need parenteral antihypertensive if blood pressure  startstarts to rise while he is n.p.o.  Other plans as per orders.    Meshelle Holness 05/31/2011, 5:56 AM

## 2011-05-31 NOTE — Progress Notes (Signed)
[facility] Chart Review Completed  

## 2011-05-31 NOTE — Progress Notes (Signed)
Subjective: This man was admitted once again yesterday after  just being discharged by me 2 days ago. So far, he has had 4 mg of intravenous Dilaudid. His radiological findings do not support bowel obstruction, he likely had ileus. He has a  NG tube in place           Physical Exam: Blood pressure 147/89, pulse 86, temperature 98.2 F (36.8 C), temperature source Oral, resp. rate 18, height 5\' 9"  (1.753 m), weight 65.772 kg (145 lb), SpO2 96.00%. He looks systemically well. His abdomen is not distended whatsoever. Bowel sounds are heard and are normal. They are not tinkling in nature. Nor does he have absence of bowel sounds. There is no tenderness in his abdomen. Heart sounds are present and normal. Lung fields are clear. He is alert and orientated.   Investigations:     Basic Metabolic Panel:  Basename 05/30/11 2346 05/29/11 0820  NA 139 144  K 3.7 4.1  CL 102 112  CO2 26 25  GLUCOSE 110* 93  BUN 7 4*  CREATININE 0.73 0.77  CALCIUM 8.9 8.6  MG -- --  PHOS -- --   Liver Function Tests:  Basename 05/30/11 2346  AST 13  ALT 10  ALKPHOS 99  BILITOT 0.2*  PROT 6.3  ALBUMIN 3.4*     CBC:  Basename 05/30/11 2346 05/29/11 0820  WBC 9.1 6.9  NEUTROABS -- --  HGB 12.8* 11.4*  HCT 37.9* 33.8*  MCV 92.4 93.6  PLT 183 157    Ct Abdomen Pelvis W Contrast  05/31/2011  *RADIOLOGY REPORT*  Clinical Data: Abdominal pain  CT ABDOMEN AND PELVIS WITH CONTRAST  Technique:  Multidetector CT imaging of the abdomen and pelvis was performed following the standard protocol during bolus administration of intravenous contrast.  Contrast: OMNIPAQUE IOHEXOL 300 MG/ML IJ SOLN  Comparison: 05/13/2011  Findings: Limited images through the lung bases demonstrate no significant appreciable abnormality. The heart size is within normal limits. No pleural or pericardial effusion.  Subcentimeter hypodensity within segment four.  Otherwise homogeneous enhancement of the liver and spleen.   Status post cholecystectomy.  There is mild CBD prominence, measuring up to 9 mm.  Tapers to the level of the ampulla.  No obstructing lesion is identified.  Similarly, the main pancreatic duct is mildly prominent at 3.5 mm.  No obstructing lesion identified.  No infiltrative or solid pancreatic mass identified.  Unremarkable adrenal glands.  Multiple renal cysts and too small to further characterize hypodensities.  Tiny nonobstructing stone within the lower pole of the right kidney.  Scarring involving the upper pole of the left kidney.  No hydronephrosis or hydroureter.  Similar to prior, there is gaseous distension of both large and small bowel without a definitive obstruction.  No free intraperitoneal air or fluid.  No lymphadenopathy. There are surgical clips within the left upper quadrant and along the stomach.  Thin-walled bladder.  Prostatomegaly.  There is scattered atherosclerotic calcification of the aorta and its branches. No aneurysmal dilatation.  Osteopenia.  Small fat containing right inguinal hernia.  IMPRESSION: Similar to prior, distension of large and small bowel without an obstructing point identified.  This may represent ileus or partial obstruction pattern.  CBD dilatation up to 9 mm status post cholecystectomy. Nonspecific.  Correlate with LFTs and ERCP / MRCP if clinically warranted.  Original Report Authenticated By: Waneta Martins, M.D.   Dg Abd Acute W/chest  05/31/2011  *RADIOLOGY REPORT*  Clinical Data: Abdominal pain  ACUTE  ABDOMEN SERIES (ABDOMEN 2 VIEW & CHEST 1 VIEW)  Comparison: 05/28/2011  Findings: Left subclavian Port-A-Cath with its tip in the SVC. Normal heart size.  Clear lungs.  There is no free intraperitoneal gas.  Colonic distention is stable.  There is increasing distention of small bowel loops within the central abdomen. Air-fluid levels in both small and large bowel are noted.  Stool is present in the descending colon. Postoperative changes from bowel resection  and anastomosis are noted.  IMPRESSION: Prominent stool in the descending colon.  Proximal colon and small bowel are dilated with air-fluid levels. There is no free intraperitoneal gas.  Original Report Authenticated By: Donavan Burnet, M.D.      Medications: I have reviewed the patient's current medications.  Impression: 1. Ileus. No bowel obstruction. 2. Overuse of opioids. 3. Adenocarcinoma of the colon.     Plan: 1. Discontinue NG tube. 2. Reduce intravenous opioids. 3. Start diet.     LOS: 1 day   [name] Singer Pager [phone]  05/31/2011, 10:21 AM

## 2011-05-31 NOTE — Progress Notes (Signed)
Patient ate a full liquid lunch, tolerated well, no nausea, patient reports "feeling bloated again".  Instructed if he has to vomit, not to flush so it can be seen, patient verbalized understanding.

## 2011-05-31 NOTE — ED Notes (Signed)
Ng tube placed, placement confirmed by auscultation and gastric contents.

## 2011-06-01 LAB — COMPREHENSIVE METABOLIC PANEL
AST: 11 U/L (ref 0–37)
Albumin: 2.9 g/dL — ABNORMAL LOW (ref 3.5–5.2)
BUN: 4 mg/dL — ABNORMAL LOW (ref 6–23)
Calcium: 8.8 mg/dL (ref 8.4–10.5)
Creatinine, Ser: 0.81 mg/dL (ref 0.50–1.35)
Total Bilirubin: 0.3 mg/dL (ref 0.3–1.2)
Total Protein: 5.3 g/dL — ABNORMAL LOW (ref 6.0–8.3)

## 2011-06-01 LAB — CBC
HCT: 34.5 % — ABNORMAL LOW (ref 39.0–52.0)
MCH: 31.8 pg (ref 26.0–34.0)
MCHC: 34.5 g/dL (ref 30.0–36.0)
MCV: 92.2 fL (ref 78.0–100.0)
Platelets: 192 10*3/uL (ref 150–400)
RDW: 13.7 % (ref 11.5–15.5)
WBC: 8.1 10*3/uL (ref 4.0–10.5)

## 2011-06-01 MED ORDER — SODIUM CHLORIDE 0.9 % IJ SOLN
INTRAMUSCULAR | Status: AC
  Administered 2011-06-01: 10 mL
  Filled 2011-06-01: qty 3

## 2011-06-01 MED ORDER — SODIUM CHLORIDE 0.9 % IJ SOLN: 10.0000 mL | INTRAMUSCULAR | Status: DC | PRN

## 2011-06-01 MED ORDER — SODIUM CHLORIDE 0.9 % IJ SOLN
INTRAMUSCULAR | Status: AC
  Filled 2011-06-01: qty 3

## 2011-06-01 MED ORDER — HEPARIN SOD (PORK) LOCK FLUSH 100 UNIT/ML IV SOLN
500.0000 [IU] | INTRAVENOUS | Status: AC | PRN
  Administered 2011-06-01: 500 [IU]
  Filled 2011-06-01: qty 5

## 2011-06-01 MED ORDER — SODIUM CHLORIDE 0.9 % IJ SOLN
10.0000 mL | Freq: Two times a day (BID) | INTRAMUSCULAR | Status: DC
  Administered 2011-06-01: 10 mL

## 2011-06-01 MED ORDER — PANTOPRAZOLE SODIUM 40 MG PO TBEC
40.0000 mg | DELAYED_RELEASE_TABLET | Freq: Every day | ORAL | Status: DC
  Administered 2011-06-01: 40 mg via ORAL
  Filled 2011-06-01: qty 1

## 2011-06-01 NOTE — Discharge Summary (Signed)
Physician Discharge Summary  [name] MRN: [id number] DOB/AGE: 1950/05/17 61 y.o. Primary Care Physician:DAVIDSON,ERIC, MD, MD Admit date: 05/30/2011 Discharge date: 06/01/2011    Discharge Diagnoses:  1. Ileus, resolved. 2. Overuse of opioids. 3. Adenocarcinoma of the colon. 4. History of pulmonary embolism, on anticoagulation with Lovenox.   Medication List  As of 06/01/2011  1:25 PM   TAKE these medications         cyanocobalamin 1000 MCG/ML injection   Commonly known as: (VITAMIN B-12)   Inject 1,000 mcg into the muscle every 30 (thirty) days.      dexlansoprazole 60 MG capsule   Commonly known as: DEXILANT   Take 1 capsule (60 mg total) by mouth daily.      enoxaparin 60 MG/0.6ML Soln   Commonly known as: LOVENOX   Inject 60 mg into the skin at bedtime.      folic acid 1 MG tablet   Commonly known as: FOLVITE   Take 1 mg by mouth daily.      LORazepam 1 MG tablet   Commonly known as: ATIVAN   Take 1 mg by mouth daily as needed. For anxiety      prochlorperazine 10 MG tablet   Commonly known as: COMPAZINE   Take 10 mg by mouth every 6 (six) hours as needed. Nausea and Vomiting      sertraline 50 MG tablet   Commonly known as: ZOLOFT   Take 50 mg by mouth at bedtime.      thiamine 100 MG tablet   Take 100 mg by mouth daily.      traMADol 50 MG tablet   Commonly known as: ULTRAM   Take 50 mg by mouth 4 (four) times daily as needed. For pain      valsartan-hydrochlorothiazide 80-12.5 MG per tablet   Commonly known as: DIOVAN-HCT   Take 1 tablet by mouth daily.            Discharged Condition: Stable and improved.    Consults: None.  Significant Diagnostic Studies: Dg Chest 2 View  05/09/2011  *RADIOLOGY REPORT*  Clinical Data: Of cough and shortness of breath  CHEST - 2 VIEW  Comparison: Chest radiograph 03/03/2011  Findings: The left subclavian power port terminates in the proximal superior vena cava, stable.  Borderline  cardiomegaly is stable. The pulmonary vascularity is normal.  The lungs are well expanded and clear.  There are no pleural effusions or pneumothorax.  The bones are osteopenic.  No acute bony abnormality is identified. Surgical clips in the upper abdomen.  IMPRESSION: No acute cardiopulmonary disease.  Original Report Authenticated By: Britta Mccreedy, M.D.   Ct Angio Chest W/cm &/or Wo Cm  05/09/2011  *RADIOLOGY REPORT*  Clinical Data: Acute shortness of breath and chest pain.  History of colon cancer and pulmonary embolism.  CT ANGIOGRAPHY CHEST  Technique:  Multidetector CT imaging of the chest using the standard protocol during bolus administration of intravenous contrast. Multiplanar reconstructed images including MIPs were obtained and reviewed to evaluate the vascular anatomy.  Contrast: OMNIPAQUE IOHEXOL 350 MG/ML IV SOLN  Comparison: Chest radiograph 02/17/2013and CT PE study 01/17/2011  Findings: Left chest wall Port-A-Cath is present.  The distal tip of the catheter terminates in the superior vena cava.  Mild cardiomegaly with left ventricular prominence.  Coronary artery atherosclerotic calcification is noted.  The thoracic aorta is normal in caliber and enhancement.  Negative for dissection.  This is satisfactory evaluation of the pulmonary arterial  tree. Central pulmonary arteries are normal in caliber. There are no filling defects in the pulmonary arteries to suggest pulmonary embolism. Specifically, the filling defects seen in the right lower lobe pulmonary arteries on the study of 01/17/2011 have resolved. These pulmonary arteries opacify normally on today's examination.  There is no pleural or pericardial effusion. No lymphadenopathy is identified in the chest.  There is a very thin ostial 1 mm linear filling defect in the right main is 10 bronchus.  This could be a tiny amount of mucus, and was not present on the prior study.  Aside from mild dependent atelectasis., the lungs are clear.  No  acute or suspicious bony abnormality.  Mild convex right scoliosis of the upper thoracic spine.  IMPRESSION:  1.  Negative for pulmonary embolism. 2.  Previously identified pulmonary emboli in the right lower lobe on the study of 01/17/2011 have resolved. 2. Question very tiny strand of mucus in the right mainstem bronchus. 3. Coronary artery atherosclerotic calcification and mild of the ventricular prominence.  Original Report Authenticated By: Britta Mccreedy, M.D.   Ct Abdomen Pelvis W Contrast  05/31/2011  *RADIOLOGY REPORT*  Clinical Data: Abdominal pain  CT ABDOMEN AND PELVIS WITH CONTRAST  Technique:  Multidetector CT imaging of the abdomen and pelvis was performed following the standard protocol during bolus administration of intravenous contrast.  Contrast: OMNIPAQUE IOHEXOL 300 MG/ML IJ SOLN  Comparison: 05/13/2011  Findings: Limited images through the lung bases demonstrate no significant appreciable abnormality. The heart size is within normal limits. No pleural or pericardial effusion.  Subcentimeter hypodensity within segment four.  Otherwise homogeneous enhancement of the liver and spleen.  Status post cholecystectomy.  There is mild CBD prominence, measuring up to 9 mm.  Tapers to the level of the ampulla.  No obstructing lesion is identified.  Similarly, the main pancreatic duct is mildly prominent at 3.5 mm.  No obstructing lesion identified.  No infiltrative or solid pancreatic mass identified.  Unremarkable adrenal glands.  Multiple renal cysts and too small to further characterize hypodensities.  Tiny nonobstructing stone within the lower pole of the right kidney.  Scarring involving the upper pole of the left kidney.  No hydronephrosis or hydroureter.  Similar to prior, there is gaseous distension of both large and small bowel without a definitive obstruction.  No free intraperitoneal air or fluid.  No lymphadenopathy. There are surgical clips within the left upper quadrant and along the  stomach.  Thin-walled bladder.  Prostatomegaly.  There is scattered atherosclerotic calcification of the aorta and its branches. No aneurysmal dilatation.  Osteopenia.  Small fat containing right inguinal hernia.  IMPRESSION: Similar to prior, distension of large and small bowel without an obstructing point identified.  This may represent ileus or partial obstruction pattern.  CBD dilatation up to 9 mm status post cholecystectomy. Nonspecific.  Correlate with LFTs and ERCP / MRCP if clinically warranted.  Original Report Authenticated By: Waneta Martins, M.D.   Ct Abdomen Pelvis W Contrast  05/13/2011  *RADIOLOGY REPORT*  Clinical Data: Abdominal distention.  Left-sided abdominal pain.  CT ABDOMEN AND PELVIS WITH CONTRAST  Technique:  Multidetector CT imaging of the abdomen and pelvis was performed following the standard protocol during bolus administration of intravenous contrast.  Contrast: OMNIPAQUE IOHEXOL 300 MG/ML IV SOLN  Comparison: 04/16/2011  Findings: Small hiatal hernia.  Heart is mildly enlarged.  Scarring in the right lung base.  No effusions.  Prior cholecystectomy.  Postoperative changes in the region of  the stomach and proximal small bowel.  Surgical changes also near the splenic flexure of the colon.  There is a nonspecific bowel gas pattern.  Mild gaseous distention of both large and small bowel.  No convincing evidence for obstruction as there is no well-defined transition point. Contrast has passed into the proximal colon.  Liver, spleen, pancreas, adrenals and left kidney are unremarkable.  Benign-appearing cyst in the mid pole of the right kidney.  No hydronephrosis.  Prostate is mildly prominent.  Urinary bladder grossly unremarkable.  Aorta and iliac vessels are calcified, non- aneurysmal.  IMPRESSION: Mild diffuse gaseous distention of both large and small bowel.  No convincing evidence for obstruction.  Oral contrast material has passed into the colon.  No definite acute  process.  Original Report Authenticated By: Cyndie Chime, M.D.   Dg Chest Port 1 View  05/26/2011  *RADIOLOGY REPORT*  Clinical Data: Hemoptysis, nausea, vomiting, history smoking, carcinoma of the colon  PORTABLE CHEST - 1 VIEW  Comparison: Portable exam 2250 hours compared to 05/09/2011  Findings: Left subclavian power port, tip projecting over SVC. Normal heart size and pulmonary vascularity for technique. Tortuous aorta. Lungs appear mildly emphysematous but clear. No pleural effusion or pneumothorax. No acute osseous findings.  IMPRESSION: No acute abnormalities.  Original Report Authenticated By: Lollie Marrow, M.D.   Dg Abd 2 Views  05/28/2011  *RADIOLOGY REPORT*  Clinical Data: History of small bowel obstruction.  ABDOMEN - 2 VIEW  Comparison: CT abdomen pelvis 05/13/2011 and chest and two views abdomen 12/04/2010.  Findings: There is marked gaseous distention of the colon.  No small bowel dilatation is seen.  No free intraperitoneal air.  IMPRESSION: Bowel gas pattern most consistent with colonic ileus.  Original Report Authenticated By: Bernadene Bell. D'ALESSIO, M.D.   Dg Abd Acute W/chest  05/31/2011  *RADIOLOGY REPORT*  Clinical Data: Abdominal pain  ACUTE ABDOMEN SERIES (ABDOMEN 2 VIEW & CHEST 1 VIEW)  Comparison: 05/28/2011  Findings: Left subclavian Port-A-Cath with its tip in the SVC. Normal heart size.  Clear lungs.  There is no free intraperitoneal gas.  Colonic distention is stable.  There is increasing distention of small bowel loops within the central abdomen. Air-fluid levels in both small and large bowel are noted.  Stool is present in the descending colon. Postoperative changes from bowel resection and anastomosis are noted.  IMPRESSION: Prominent stool in the descending colon.  Proximal colon and small bowel are dilated with air-fluid levels. There is no free intraperitoneal gas.  Original Report Authenticated By: Donavan Burnet, M.D.    Lab Results: Basic Metabolic  Panel:  Basename 06/01/11 0607 05/30/11 2346  NA 141 139  K 3.8 3.7  CL 108 102  CO2 28 26  GLUCOSE 99 110*  BUN 4* 7  CREATININE 0.81 0.73  CALCIUM 8.8 8.9  MG -- --  PHOS -- --   Liver Function Tests:  [facility] 06/01/11 0607 05/30/11 2346  AST 11 13  ALT 7 10  ALKPHOS 86 99  BILITOT 0.3 0.2*  PROT 5.3* 6.3  ALBUMIN 2.9* 3.4*     CBC:  Basename 06/01/11 0607 05/30/11 2346  WBC 8.1 9.1  NEUTROABS -- --  HGB 11.9* 12.8*  HCT 34.5* 37.9*  MCV 92.2 92.4  PLT 192 183       Hospital Course: This 61 year old man was admitted once again with symptoms of abdominal pain and distention. This is a similar presentation to his most recent admission. CT scan of his  abdomen showed diffuse bowel dilation but no real evidence of bowel obstruction. He was treated with NG tube and IV fluids. He did well and his NG tube was removed and diet was progressed. He has kept all his food eaten yesterday and today without vomiting. His abdomen is not distended anymore. There has been suspicion regarding overuse of opioid on this patient.  Discharge Exam: Blood pressure 123/82, pulse 68, temperature 97.7 F (36.5 C), temperature source Oral, resp. rate 20, height 5\' 9"  (1.753 m), weight 65.772 kg (145 lb), SpO2 97.00%. He looks systemically well. His abdomen is soft and nontender. The abdomen is not distended. Bowel sounds are heard and are normal. Heart sounds are present and normal. Lung fields are clear. He is alert and orientated without any focal neurologic signs.  Disposition: Home.  Discharge Orders    Future Appointments: Provider: Department: Dept Phone: Center:   07/08/2011 1:00 PM [facility] [phone] None   08/23/2011 10:00 AM [facility] [phone] None   09/02/2011 1:00 PM [facility] [phone] None   10/28/2011 1:00 PM [facility] [phone] None   11/23/2011 10:00 AM [facility] [phone] None   11/23/2011 11:30 AM Ellouise Newer, [facility] [phone] None   02/22/2012 10:00 AM [facility] [phone] None     Future Orders Please Complete By Expires   Diet - low sodium heart healthy      Increase activity slowly         Follow-up Information    Follow up with [facility], MD .         SignedWilson Singer Pager [phone]  06/01/2011, 1:25 PM

## 2011-06-01 NOTE — Progress Notes (Signed)
Discharge instructions given, verbalized understanding, out ambulatory in stable condition with staff. 

## 2011-06-02 NOTE — Progress Notes (Signed)
Discharge summary sent to payer through [facility]  

## 2011-06-06 ENCOUNTER — Encounter (HOSPITAL_COMMUNITY): Payer: Self-pay | Admitting: *Deleted

## 2011-06-06 DIAGNOSIS — Z7901 Long term (current) use of anticoagulants: Secondary | ICD-10-CM | POA: Insufficient documentation

## 2011-06-06 DIAGNOSIS — R110 Nausea: Secondary | ICD-10-CM | POA: Insufficient documentation

## 2011-06-06 DIAGNOSIS — C189 Malignant neoplasm of colon, unspecified: Secondary | ICD-10-CM | POA: Insufficient documentation

## 2011-06-06 DIAGNOSIS — I10 Essential (primary) hypertension: Secondary | ICD-10-CM | POA: Insufficient documentation

## 2011-06-06 DIAGNOSIS — R197 Diarrhea, unspecified: Secondary | ICD-10-CM | POA: Insufficient documentation

## 2011-06-06 DIAGNOSIS — Z86711 Personal history of pulmonary embolism: Secondary | ICD-10-CM | POA: Insufficient documentation

## 2011-06-06 DIAGNOSIS — Z79899 Other long term (current) drug therapy: Secondary | ICD-10-CM | POA: Insufficient documentation

## 2011-06-06 DIAGNOSIS — K560 Paralytic ileus: Principal | ICD-10-CM | POA: Insufficient documentation

## 2011-06-06 NOTE — ED Notes (Signed)
Pt c/o nausea, diarrhea, and left sided abdominal pain x 2hrs.

## 2011-06-07 ENCOUNTER — Encounter (HOSPITAL_COMMUNITY): Payer: Self-pay | Admitting: Internal Medicine

## 2011-06-07 ENCOUNTER — Observation Stay (HOSPITAL_COMMUNITY)
Admission: EM | Admit: 2011-06-07 | Discharge: 2011-06-08 | Disposition: A | Discharge status: Home / Self Care | Payer: Federal, State, Local not specified - PPO | Attending: Internal Medicine | Admitting: Internal Medicine

## 2011-06-07 ENCOUNTER — Emergency Department (HOSPITAL_COMMUNITY): Payer: Federal, State, Local not specified - PPO

## 2011-06-07 DIAGNOSIS — K920 Hematemesis: Secondary | ICD-10-CM

## 2011-06-07 DIAGNOSIS — K56609 Unspecified intestinal obstruction, unspecified as to partial versus complete obstruction: Secondary | ICD-10-CM

## 2011-06-07 DIAGNOSIS — K219 Gastro-esophageal reflux disease without esophagitis: Secondary | ICD-10-CM

## 2011-06-07 DIAGNOSIS — R509 Fever, unspecified: Secondary | ICD-10-CM

## 2011-06-07 DIAGNOSIS — D61810 Antineoplastic chemotherapy induced pancytopenia: Secondary | ICD-10-CM

## 2011-06-07 DIAGNOSIS — R140 Abdominal distension (gaseous): Secondary | ICD-10-CM

## 2011-06-07 DIAGNOSIS — K2210 Ulcer of esophagus without bleeding: Secondary | ICD-10-CM

## 2011-06-07 DIAGNOSIS — R079 Chest pain, unspecified: Secondary | ICD-10-CM

## 2011-06-07 DIAGNOSIS — J189 Pneumonia, unspecified organism: Secondary | ICD-10-CM

## 2011-06-07 DIAGNOSIS — R109 Unspecified abdominal pain: Secondary | ICD-10-CM

## 2011-06-07 DIAGNOSIS — D689 Coagulation defect, unspecified: Secondary | ICD-10-CM

## 2011-06-07 DIAGNOSIS — J209 Acute bronchitis, unspecified: Secondary | ICD-10-CM

## 2011-06-07 DIAGNOSIS — R112 Nausea with vomiting, unspecified: Secondary | ICD-10-CM

## 2011-06-07 DIAGNOSIS — I10 Essential (primary) hypertension: Secondary | ICD-10-CM | POA: Diagnosis present

## 2011-06-07 DIAGNOSIS — R791 Abnormal coagulation profile: Secondary | ICD-10-CM

## 2011-06-07 DIAGNOSIS — C189 Malignant neoplasm of colon, unspecified: Secondary | ICD-10-CM

## 2011-06-07 DIAGNOSIS — R110 Nausea: Secondary | ICD-10-CM

## 2011-06-07 DIAGNOSIS — Z903 Acquired absence of stomach [part of]: Secondary | ICD-10-CM

## 2011-06-07 DIAGNOSIS — G8929 Other chronic pain: Secondary | ICD-10-CM

## 2011-06-07 DIAGNOSIS — I2699 Other pulmonary embolism without acute cor pulmonale: Secondary | ICD-10-CM

## 2011-06-07 DIAGNOSIS — K56600 Partial intestinal obstruction, unspecified as to cause: Secondary | ICD-10-CM

## 2011-06-07 DIAGNOSIS — T451X5A Adverse effect of antineoplastic and immunosuppressive drugs, initial encounter: Secondary | ICD-10-CM

## 2011-06-07 DIAGNOSIS — K567 Ileus, unspecified: Secondary | ICD-10-CM

## 2011-06-07 DIAGNOSIS — Z86711 Personal history of pulmonary embolism: Secondary | ICD-10-CM

## 2011-06-07 DIAGNOSIS — D638 Anemia in other chronic diseases classified elsewhere: Secondary | ICD-10-CM

## 2011-06-07 DIAGNOSIS — K529 Noninfective gastroenteritis and colitis, unspecified: Secondary | ICD-10-CM

## 2011-06-07 DIAGNOSIS — K922 Gastrointestinal hemorrhage, unspecified: Secondary | ICD-10-CM

## 2011-06-07 LAB — DIFFERENTIAL
Eosinophils Relative: 1 % (ref 0–5)
Lymphocytes Relative: 33 % (ref 12–46)
Lymphs Abs: 3.2 10*3/uL (ref 0.7–4.0)
Monocytes Relative: 9 % (ref 3–12)

## 2011-06-07 LAB — COMPREHENSIVE METABOLIC PANEL
ALT: 10 U/L (ref 0–53)
Alkaline Phosphatase: 88 U/L (ref 39–117)
BUN: 12 mg/dL (ref 6–23)
CO2: 26 mEq/L (ref 19–32)
Calcium: 8.7 mg/dL (ref 8.4–10.5)
GFR calc Af Amer: >90 mL/min (ref >90)
GFR calc non Af Amer: >90 mL/min (ref >90)
Glucose, Bld: 88 mg/dL (ref 70–99)
Potassium: 3.9 mEq/L (ref 3.5–5.1)
Sodium: 139 mEq/L (ref 135–145)

## 2011-06-07 LAB — CBC
Hemoglobin: 11.6 g/dL — ABNORMAL LOW (ref 13.0–17.0)
MCH: 31.2 pg (ref 26.0–34.0)
MCV: 94.4 fL (ref 78.0–100.0)
Platelets: 216 10*3/uL (ref 150–400)
RBC: 3.72 MIL/uL — ABNORMAL LOW (ref 4.22–5.81)
WBC: 9.5 10*3/uL (ref 4.0–10.5)

## 2011-06-07 MED ORDER — SODIUM CHLORIDE 0.9 % IJ SOLN
INTRAMUSCULAR | Status: AC
  Filled 2011-06-07: qty 3

## 2011-06-07 MED ORDER — HYDROMORPHONE HCL PF 1 MG/ML IJ SOLN
1.0000 mg | Freq: Once | INTRAMUSCULAR | Status: AC
  Administered 2011-06-07: 1 mg via INTRAVENOUS
  Filled 2011-06-07: qty 1

## 2011-06-07 MED ORDER — ONDANSETRON HCL 4 MG/2ML IJ SOLN
4.0000 mg | Freq: Once | INTRAMUSCULAR | Status: AC
  Administered 2011-06-07: 4 mg via INTRAVENOUS
  Filled 2011-06-07: qty 2

## 2011-06-07 MED ORDER — MILK AND MOLASSES ENEMA
Freq: Once | RECTAL | Status: AC
  Administered 2011-06-07: 11:00:00 via RECTAL
  Filled 2011-06-07: qty 250

## 2011-06-07 MED ORDER — ALBUTEROL SULFATE (5 MG/ML) 0.5% IN NEBU: 2.5000 mg | INHALATION_SOLUTION | RESPIRATORY_TRACT | Status: DC | PRN

## 2011-06-07 MED ORDER — ONDANSETRON HCL 4 MG/2ML IJ SOLN
4.0000 mg | Freq: Four times a day (QID) | INTRAMUSCULAR | Status: DC | PRN
  Administered 2011-06-07 – 2011-06-08 (×4): 4 mg via INTRAVENOUS
  Filled 2011-06-07 (×4): qty 2

## 2011-06-07 MED ORDER — ONDANSETRON HCL 4 MG PO TABS: 4.0000 mg | ORAL_TABLET | Freq: Four times a day (QID) | ORAL | Status: DC | PRN

## 2011-06-07 MED ORDER — SODIUM CHLORIDE 0.9 % IV BOLUS (SEPSIS)
1000.0000 mL | Freq: Once | INTRAVENOUS | Status: AC
  Administered 2011-06-07: 1000 mL via INTRAVENOUS

## 2011-06-07 MED ORDER — MORPHINE SULFATE 2 MG/ML IJ SOLN
2.0000 mg | INTRAMUSCULAR | Status: DC | PRN
  Administered 2011-06-07 – 2011-06-08 (×7): 2 mg via INTRAVENOUS
  Filled 2011-06-07 (×7): qty 1

## 2011-06-07 MED ORDER — LORAZEPAM 0.5 MG PO TABS: 1.0000 mg | ORAL_TABLET | Freq: Every day | ORAL | Status: DC | PRN

## 2011-06-07 MED ORDER — ERYTHROMYCIN BASE 250 MG PO TABS
250.0000 mg | ORAL_TABLET | Freq: Three times a day (TID) | ORAL | Status: DC
  Administered 2011-06-07 – 2011-06-08 (×4): 250 mg via ORAL
  Filled 2011-06-07 (×12): qty 1

## 2011-06-07 MED ORDER — SERTRALINE HCL 50 MG PO TABS
50.0000 mg | ORAL_TABLET | Freq: Every day | ORAL | Status: DC
  Administered 2011-06-07: 50 mg via ORAL
  Filled 2011-06-07: qty 1

## 2011-06-07 MED ORDER — DEXTROSE-NACL 5-0.45 % IV SOLN
INTRAVENOUS | Status: DC
  Administered 2011-06-07: 1000 mL via INTRAVENOUS
  Administered 2011-06-07 – 2011-06-08 (×2): via INTRAVENOUS

## 2011-06-07 MED ORDER — VALSARTAN-HYDROCHLOROTHIAZIDE 80-12.5 MG PO TABS: 1.0000 | ORAL_TABLET | Freq: Every day | ORAL | Status: DC

## 2011-06-07 MED ORDER — FOLIC ACID 1 MG PO TABS
1.0000 mg | ORAL_TABLET | Freq: Every day | ORAL | Status: DC
  Administered 2011-06-07 – 2011-06-08 (×2): 1 mg via ORAL
  Filled 2011-06-07 (×2): qty 1

## 2011-06-07 MED ORDER — VITAMIN B-1 100 MG PO TABS
100.0000 mg | ORAL_TABLET | Freq: Every day | ORAL | Status: DC
  Administered 2011-06-07 – 2011-06-08 (×2): 100 mg via ORAL
  Filled 2011-06-07 (×2): qty 1

## 2011-06-07 MED ORDER — SODIUM CHLORIDE 0.9 % IV SOLN: Freq: Once | INTRAVENOUS | Status: DC

## 2011-06-07 MED ORDER — ACETAMINOPHEN 650 MG RE SUPP: 650.0000 mg | Freq: Four times a day (QID) | RECTAL | Status: DC | PRN

## 2011-06-07 MED ORDER — SODIUM CHLORIDE 0.9 % IV SOLN: INTRAVENOUS | Status: AC

## 2011-06-07 MED ORDER — ENOXAPARIN SODIUM 60 MG/0.6ML ~~LOC~~ SOLN
60.0000 mg | Freq: Every day | SUBCUTANEOUS | Status: DC
  Administered 2011-06-07: 60 mg via SUBCUTANEOUS
  Filled 2011-06-07: qty 0.6

## 2011-06-07 MED ORDER — DIPHENOXYLATE-ATROPINE 2.5-0.025 MG PO TABS: 2.0000 | ORAL_TABLET | Freq: Once | ORAL | Status: DC

## 2011-06-07 MED ORDER — DOCUSATE SODIUM 100 MG PO CAPS
100.0000 mg | ORAL_CAPSULE | Freq: Two times a day (BID) | ORAL | Status: DC
  Administered 2011-06-07 – 2011-06-08 (×3): 100 mg via ORAL
  Filled 2011-06-07 (×3): qty 1

## 2011-06-07 MED ORDER — IRBESARTAN 75 MG PO TABS
75.0000 mg | ORAL_TABLET | Freq: Every day | ORAL | Status: DC
  Administered 2011-06-07 – 2011-06-08 (×2): 75 mg via ORAL
  Filled 2011-06-07 (×2): qty 1

## 2011-06-07 MED ORDER — HYDROCHLOROTHIAZIDE 25 MG PO TABS
12.5000 mg | ORAL_TABLET | Freq: Every day | ORAL | Status: DC
  Administered 2011-06-07 – 2011-06-08 (×2): 12.5 mg via ORAL
  Filled 2011-06-07 (×2): qty 1

## 2011-06-07 MED ORDER — ACETAMINOPHEN 325 MG PO TABS: 650.0000 mg | ORAL_TABLET | Freq: Four times a day (QID) | ORAL | Status: DC | PRN

## 2011-06-07 NOTE — Consult Note (Signed)
Reason for Consult:Nausea, vomiting and abdominal pain Referring Physician: [facility].  [name] is an 61 y.o. male.  HPI: Patient has had several episodes of increasing nausea and vomiting.  Has had flatus and diarrhea over the last couple weeks.  No blood noted with stools.  No fever or chills.  No recent chemotherapy.    Past Medical History  Diagnosis Date  . 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 Pulmonary embolism 02/2010  . Acid reflux   . Hypertension   . Osteoporosis   . Arthritis   . TIA (transient ischemic attack) 10/11  . ETOH abuse     quit 03/2010  . 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 Personal history of PE (pulmonary embolism) 10/01/2010  . Blood transfusion   . S/P endoscopy September 28, 2010    erosive reflux esophagitis, Billroth I anatomy  . Angina   . Shortness of breath   . Sleep apnea   . Recurrent upper respiratory infection (URI)   . Anxiety   . Pneumonia   . Anemia   . Ileus     Past Surgical History  Procedure Date  . Hernia repair     right inguinal  . Appendectomy 1980s  . Cholecystectomy 1980s  . Colon surgery May 2012    left hemicolectomy, colon cancer found at time of surgery for bowel obstruction  . Portacath placement   . Abdominal sugery     for bowel obstruction x 8, all in 1980s, except for one in 07/2010  . Esophagogastroduodenoscopy 09/28/2010  . Esophagogastroduodenoscopy 12/01/2010    Cervical web status post dilation, erosive esophagitis, B1 hemigastrectomy, inflamed anastomosis  . Colonoscopy 03/18/2011    anastomosis at 35cm. Several adenomatous polyps removed. Sigmoid diverticulosis. Next TCS 02/2013  . Esophagogastroduodenoscopy 04/16/2011    excoriation at GEJ c/w trauma/M-W tear, friable gastric anastomosis, dilation efferent limb  . 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 Hypertension Mother   .  Arthritis Mother   . Pneumonia Mother   . Hypertension Father   . Heart attack Father   . Colon cancer Neg Hx     Social History:  reports that he has been smoking Cigarettes.  He has a 20 pack-year smoking history. He does not have any smokeless tobacco history on file. He reports that he does not drink alcohol or use illicit drugs.  Allergies: No Known Allergies  Medications:  I have reviewed the patient's current medications. Prior to Admission:  Prescriptions prior to admission  Medication Sig Dispense Refill  . cyanocobalamin (,VITAMIN B-12,) 1000 MCG/ML injection Inject 1,000 mcg into the muscle every 30 (thirty) days.      [name] dexlansoprazole (DEXILANT) 60 MG capsule Take 1 capsule (60 mg total) by mouth daily.  30 capsule  11  . enoxaparin (LOVENOX) 60 MG/0.6ML SOLN Inject 60 mg into the skin at bedtime.      . folic acid (FOLVITE) 1 MG tablet Take 1 mg by mouth daily.        [name] LORazepam (ATIVAN) 1 MG tablet Take 1 mg by mouth daily as needed. For anxiety      . polyethylene glycol powder (GLYCOLAX/MIRALAX) powder Take 17 g by mouth as needed. For constipation      . prochlorperazine (COMPAZINE) 10 MG tablet Take 10 mg by mouth every 6 (six) hours as  needed. Nausea and Vomiting      . sertraline (ZOLOFT) 50 MG tablet Take 50 mg by mouth at bedtime.       . thiamine 100 MG tablet Take 100 mg by mouth daily.        . traMADol (ULTRAM) 50 MG tablet Take 50 mg by mouth 4 (four) times daily as needed. For pain      . valsartan-hydrochlorothiazide (DIOVAN-HCT) 80-12.5 MG per tablet Take 1 tablet by mouth daily.       Scheduled:   . sodium chloride   Intravenous STAT  . docusate sodium  100 mg Oral BID  . enoxaparin  60 mg Subcutaneous QHS  . folic acid  1 mg Oral Daily  . hydrochlorothiazide  12.5 mg Oral Daily  .  HYDROmorphone (DILAUDID) injection  1 mg Intravenous Once  . irbesartan  75 mg Oral Daily  . milk and molasses   Rectal Once  . ondansetron  4 mg Intravenous Once  .  sertraline  50 mg Oral QHS  . sodium chloride  1,000 mL Intravenous Once  . sodium chloride      . thiamine  100 mg Oral Daily  . DISCONTD: sodium chloride   Intravenous Once  . DISCONTD: diphenoxylate-atropine  2 tablet Oral Once  . DISCONTD: valsartan-hydrochlorothiazide  1 tablet Oral Daily   Continuous:   . dextrose 5 % and 0.45% NaCl 100 mL/hr at 06/07/11 1500   BMW:[id number], acetaminophen, albuterol, LORazepam, morphine, ondansetron (ZOFRAN) IV, ondansetron  Results for orders placed during the hospital encounter of 06/07/11 (from the past 48 hour(s))  CBC     Status: Abnormal   Collection Time   06/07/11  1:55 AM      Component Value Range Comment   WBC 9.5  4.0 - 10.5 (K/uL)    RBC 3.72 (*) 4.22 - 5.81 (MIL/uL)    Hemoglobin 11.6 (*) 13.0 - 17.0 (g/dL)    HCT 36.6 (*) 44.0 - 52.0 (%)    MCV 94.4  78.0 - 100.0 (fL)    MCH 31.2  26.0 - 34.0 (pg)    MCHC 33.0  30.0 - 36.0 (g/dL)    RDW 34.7  42.5 - 95.6 (%)    Platelets 216  150 - 400 (K/uL)   DIFFERENTIAL     Status: Normal   Collection Time   06/07/11  1:55 AM      Component Value Range Comment   Neutrophils Relative 57  43 - 77 (%)    Neutro Abs 5.4  1.7 - 7.7 (K/uL)    Lymphocytes Relative 33  12 - 46 (%)    Lymphs Abs 3.2  0.7 - 4.0 (K/uL)    Monocytes Relative 9  3 - 12 (%)    Monocytes Absolute 0.8  0.1 - 1.0 (K/uL)    Eosinophils Relative 1  0 - 5 (%)    Eosinophils Absolute 0.1  0.0 - 0.7 (K/uL)    Basophils Relative 0  0 - 1 (%)    Basophils Absolute 0.0  0.0 - 0.1 (K/uL)   COMPREHENSIVE METABOLIC PANEL     Status: Abnormal   Collection Time   06/07/11  1:55 AM      Component Value Range Comment   Sodium 139  135 - 145 (mEq/L)    Potassium 3.9  3.5 - 5.1 (mEq/L)    Chloride 105  96 - 112 (mEq/L)    CO2 26  19 - 32 (mEq/L)    Glucose,  Bld 88  70 - 99 (mg/dL)    BUN 12  6 - 23 (mg/dL)    Creatinine, Ser 1.61  0.50 - 1.35 (mg/dL)    Calcium 8.7  8.4 - 10.5 (mg/dL)    Total Protein 6.0  6.0 - 8.3  (g/dL)    Albumin 3.2 (*) 3.5 - 5.2 (g/dL)    AST 15  0 - 37 (U/L)    ALT 10  0 - 53 (U/L)    Alkaline Phosphatase 88  39 - 117 (U/L)    Total Bilirubin 0.2 (*) 0.3 - 1.2 (mg/dL)    GFR calc non Af Amer >90  >90 (mL/min)    GFR calc Af Amer >90  >90 (mL/min)   PROTIME-INR     Status: Normal   Collection Time   06/07/11 10:23 AM      Component Value Range Comment   Prothrombin Time 13.1  11.6 - 15.2 (seconds)    INR 0.97  0.00 - 1.49      Dg Abd Acute W/chest  06/07/2011  *RADIOLOGY REPORT*  Clinical Data: Nausea.  Left-sided abdominal pain.  ACUTE ABDOMEN SERIES (ABDOMEN 2 VIEW & CHEST 1 VIEW)  Comparison: 13/11/13  Findings:  Left chest wall porta-catheter is noted with tip in the SVC.  Heart size and mediastinal contours are normal.  No pleural effusion or edema.  No airspace consolidation.  Prominent stool is identified within the left colon.  Again identified is distention of the small bowel and proximal half of the colon.  Compared with previous exam the degree of bowel distention is not significantly changed.  IMPRESSION:  1.  No change in abnormal bowel distention compared with 05/31/2011. 2.  No acute cardiopulmonary abnormalities.  Original Report Authenticated By: Rosealee Albee, M.D.    Review of Systems  Constitutional: Positive for malaise/fatigue. Negative for fever and chills.  HENT: Negative.   Eyes: Negative.   Respiratory: Negative.   Cardiovascular: Negative.   Gastrointestinal: Positive for nausea, abdominal pain (LLQ) and diarrhea. Negative for vomiting, constipation, blood in stool and melena.  Genitourinary: Negative.   Musculoskeletal: Negative.   Skin: Negative.   Neurological: Negative.   Endo/Heme/Allergies: Negative.   Psychiatric/Behavioral: Negative.    Blood pressure 128/77, pulse 58, temperature 98.1 F (36.7 C), temperature source Oral, resp. rate 14, height 5\' 9"  (1.753 m), weight 64.5 kg (142 lb 3.2 oz), SpO2 97.00%. Physical Exam    Constitutional: He is oriented to person, place, and time. He appears well-developed and well-nourished. No distress.  HENT:  Head: Normocephalic and atraumatic.  Eyes: Conjunctivae and EOM are normal. Pupils are equal, round, and reactive to light.  Neck: Normal range of motion. Neck supple. No tracheal deviation present. No thyromegaly present.  Cardiovascular: Normal rate, regular rhythm and normal heart sounds.   Respiratory: Effort normal and breath sounds normal. No respiratory distress.  GI: Soft. Bowel sounds are normal. He exhibits no distension and no mass. There is tenderness (Mild Left lower quadrant.  No peritoneal signs.  ). There is no rebound and no guarding.       Multiple abdominal scars.  Musculoskeletal: Normal range of motion.  Lymphadenopathy:    He has no cervical adenopathy.  Neurological: He is alert and oriented to person, place, and time.  Skin: Skin is warm and dry.    Assessment/Plan: Nausea vomiting.  Low suspicion of obstruction even a partial.  Feels this is likely more of a motility issue given diffuse findings on CT  and patient's presentation.  Low suspicion of recurrent tumor.  Continue retrograde washouts and will add bowel motility agent.  Inga Noller C 06/07/2011, 3:06 PM

## 2011-06-07 NOTE — ED Provider Notes (Signed)
History     CSN: [id number]  Arrival date & time 06/06/11  2157   First MD Initiated Contact with Patient 06/07/11 0047      Chief Complaint  Patient presents with  . Nausea  . Abdominal Pain  . Diarrhea    (Consider location/radiation/quality/duration/timing/severity/associated sxs/prior treatment) HPI [name] is a 61 y.o. male  With a h/o  HTN, GERD, TIA, pulmonary embolus on lovenox, colon cancer s/op multiple surgeries, on chemotherapy with , two recent hospitalizations for bowel obstruction symptoms, who presents to the Emergency Department complaining of  Nausea, diarrhea x1 and left sided abdominal pain that began at approximately 8 PM. Denies vomiting, fever, chills. Symptoms are similar to previous ileus. PCP Dr. Ignacia Palma Oncology Dr. Mariel Sleet  Past Medical History  Diagnosis Date  . 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 Pulmonary embolism 02/2010  . Acid reflux   . Hypertension   . Osteoporosis   . Arthritis   . TIA (transient ischemic attack) 10/11  . ETOH abuse     quit 03/2010  . 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 Personal history of PE (pulmonary embolism) 10/01/2010  . Blood transfusion   . S/P endoscopy September 28, 2010    erosive reflux esophagitis, Billroth I anatomy  . Angina   . Shortness of breath   . Sleep apnea   . Recurrent upper respiratory infection (URI)   . Anxiety   . Pneumonia   . Anemia   . Ileus     Past Surgical History  Procedure Date  . Hernia repair     right inguinal  . Appendectomy 1980s  . Cholecystectomy 1980s  . Colon surgery May 2012    left hemicolectomy, colon cancer found at time of surgery for bowel obstruction  . Portacath placement   . Abdominal sugery     for bowel obstruction x 8, all in 1980s, except for one in 07/2010  . Esophagogastroduodenoscopy 09/28/2010  . Esophagogastroduodenoscopy 12/01/2010    Cervical web status post dilation, erosive esophagitis, B1 hemigastrectomy,  inflamed anastomosis  . Colonoscopy 03/18/2011    anastomosis at 35cm. Several adenomatous polyps removed. Sigmoid diverticulosis. Next TCS 02/2013  . Esophagogastroduodenoscopy 04/16/2011    excoriation at [facility] c/w trauma/M-W tear, friable gastric anastomosis, dilation efferent limb  . 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 Hypertension Mother   . Arthritis Mother   . Hypertension Father   . Colon cancer Neg Hx     History  Substance Use Topics  . Smoking status: Current Everyday Smoker -- 0.5 packs/day for 40 years    Types: Cigarettes  . Smokeless tobacco: Not on file  . Alcohol Use: No     last drink was Apr 16 2010      Review of Systems ROS: Statement: All systems negative except as marked or noted in the HPI; Constitutional: Negative for fever and chills. ; ; Eyes: Negative for eye pain, redness and discharge. ; ; ENMT: Negative for ear pain, hoarseness, nasal congestion, sinus pressure and sore throat. ; ; Cardiovascular: Negative for chest pain, palpitations, diaphoresis, dyspnea and peripheral edema. ; ; Respiratory: Negative for cough, wheezing and stridor. ; ; Gastrointestinal: Negative for nausea,  diarrhea, abdominal pain, blood in stool, hematemesis, jaundice and rectal bleeding. . ; ; Genitourinary: Negative for dysuria, flank pain and hematuria. ; ;  Musculoskeletal: Negative for back pain and neck pain. Negative for swelling and trauma.; ; Skin: Negative for pruritus, rash, abrasions, blisters, bruising and skin lesion.; ; Neuro: Negative for headache, lightheadedness and neck stiffness. Negative for weakness, altered level of consciousness , altered mental status, extremity weakness, paresthesias, involuntary movement, seizure and syncope.     Allergies  Review of patient's allergies indicates no known allergies.  Home Medications   Current Outpatient Rx  Name Route Sig  Dispense Refill  . CYANOCOBALAMIN 1000 MCG/ML IJ SOLN Intramuscular Inject 1,000 mcg into the muscle every 30 (thirty) days.    . DEXLANSOPRAZOLE 60 MG PO CPDR Oral Take 1 capsule (60 mg total) by mouth daily. 30 capsule 11  . ENOXAPARIN SODIUM 60 MG/0.6ML [place] SOLN Subcutaneous Inject 60 mg into the skin at bedtime.    [name] FOLIC ACID 1 MG PO TABS Oral Take 1 mg by mouth daily.      [name] LORAZEPAM 1 MG PO TABS Oral Take 1 mg by mouth daily as needed. For anxiety    . MIRALAX PO Oral Take by mouth.    [name] PROCHLORPERAZINE MALEATE 10 MG PO TABS Oral Take 10 mg by mouth every 6 (six) hours as needed. Nausea and Vomiting    . SERTRALINE HCL 50 MG PO TABS Oral Take 50 mg by mouth at bedtime.     . THIAMINE HCL 100 MG PO TABS Oral Take 100 mg by mouth daily.      . TRAMADOL HCL 50 MG PO TABS Oral Take 50 mg by mouth 4 (four) times daily as needed. For pain    . VALSARTAN-HYDROCHLOROTHIAZIDE 80-12.5 MG PO TABS Oral Take 1 tablet by mouth daily.      BP 134/82  Pulse 84  Temp 98.2 F (36.8 C)  Resp 20  Ht 5\' 9"  (1.753 m)  Wt 145 lb (65.772 kg)  BMI 21.41 kg/m2  SpO2 98%  Physical Exam Physical examination:  Nursing notes reviewed; Vital signs and O2 SAT reviewed;  Constitutional: Well developed, Well nourished, Well hydrated, In no acute distress; Head:  Normocephalic, atraumatic; Eyes: EOMI, PERRL, No scleral icterus; ENMT: Mouth and pharynx normal, Mucous membranes moist; Neck: Supple, Full range of motion, No lymphadenopathy; Cardiovascular: Regular rate and rhythm, No murmur, rub, or gallop; Respiratory: Breath sounds clear & equal bilaterally, No rales, rhonchi, wheezes, or rub, Normal respiratory effort/excursion; Chest: Nontender, Movement normal, port a cath in left upper chest.; Abdomen: Soft, mild left sided tenderness to palpation, Nondistended,hyperactive  bowel sounds; Genitourinary: No CVA tenderness; Extremities: Pulses normal, No tenderness, No edema, No calf edema or asymmetry.; Neuro:  AA&Ox3, Major CN grossly intact.  No gross focal motor or sensory deficits in extremities.; Skin: Color normal, Warm, Dry  ED Course  Procedures (including critical care time)  Results for orders placed during the hospital encounter of 06/07/11  CBC      Component Value Range   WBC 9.5  4.0 - 10.5 (K/uL)   RBC 3.72 (*) 4.22 - 5.81 (MIL/uL)   Hemoglobin 11.6 (*) 13.0 - 17.0 (g/dL)   HCT 40.9 (*) 81.1 - 52.0 (%)   MCV 94.4  78.0 - 100.0 (fL)   MCH 31.2  26.0 - 34.0 (pg)   MCHC 33.0  30.0 - 36.0 (g/dL)   RDW 91.4  78.2 - 95.6 (%)   Platelets 216  150 - 400 (K/uL)  DIFFERENTIAL      Component Value Range   Neutrophils Relative 57  43 - 77 (%)  Neutro Abs 5.4  1.7 - 7.7 (K/uL)   Lymphocytes Relative 33  12 - 46 (%)   Lymphs Abs 3.2  0.7 - 4.0 (K/uL)   Monocytes Relative 9  3 - 12 (%)   Monocytes Absolute 0.8  0.1 - 1.0 (K/uL)   Eosinophils Relative 1  0 - 5 (%)   Eosinophils Absolute 0.1  0.0 - 0.7 (K/uL)   Basophils Relative 0  0 - 1 (%)   Basophils Absolute 0.0  0.0 - 0.1 (K/uL)  COMPREHENSIVE METABOLIC PANEL      Component Value Range   Sodium 139  135 - 145 (mEq/L)   Potassium 3.9  3.5 - 5.1 (mEq/L)   Chloride 105  96 - 112 (mEq/L)   CO2 26  19 - 32 (mEq/L)   Glucose, Bld 88  70 - 99 (mg/dL)   BUN 12  6 - 23 (mg/dL)   Creatinine, Ser 5.40  0.50 - 1.35 (mg/dL)   Calcium 8.7  8.4 - 98.1 (mg/dL)   Total Protein 6.0  6.0 - 8.3 (g/dL)   Albumin 3.2 (*) 3.5 - 5.2 (g/dL)   AST 15  0 - 37 (U/L)   ALT 10  0 - 53 (U/L)   Alkaline Phosphatase 88  39 - 117 (U/L)   Total Bilirubin 0.2 (*) 0.3 - 1.2 (mg/dL)   GFR calc non Af Amer >90  >90 (mL/min)   GFR calc Af Amer >90  >90 (mL/min)   Dg Abd Acute W/chest  06/07/2011  *RADIOLOGY REPORT*  Clinical Data: Nausea.  Left-sided abdominal pain.  ACUTE ABDOMEN SERIES (ABDOMEN 2 VIEW & CHEST 1 VIEW)  Comparison: 13/11/13  Findings:  Left chest wall porta-catheter is noted with tip in the SVC.  Heart size and mediastinal contours are  normal.  No pleural effusion or edema.  No airspace consolidation.  Prominent stool is identified within the left colon.  Again identified is distention of the small bowel and proximal half of the colon.  Compared with previous exam the degree of bowel distention is not significantly changed.  IMPRESSION:  1.  No change in abnormal bowel distention compared with 05/31/2011. 2.  No acute cardiopulmonary abnormalities.  Original Report Authenticated By: Rosealee Albee, M.D.   Review of previous records: (Billroth I and II in 1981, total 3 gastroduodenal surgeries  apparently for duodenal tumor.  2. Between 1982 and 1988, the patient says he has 8 abdominal surgeries for adhesions related to his earlier surgeries and at  times required intestinal resection.)  0335 Spoke with Dr. Rito Ehrlich, hospitalist. He has agreed to admission, med surg bed, Team 2.   MDM  Patient with history of colon cancer, status post resection and currently on chemotherapy, here with recurrent abdominal pain and nausea. Labs are unremarkable. Acute abdominal series does not show an obstruction but does show mild distention unchanged from a study done on 05/31/2011. Current picture is consistent with an ileus. Patient has been given IV fluids, anti-emetic, analgesic, with some relief. The patient does not feel comfortable going home. Will arrange for admission. Spoke with Dr. Rito Ehrlich, hospitalist, who will admit. Pt stable in ED with no significant deterioration in condition.The patient appears reasonably stabilized for admission considering the current resources, flow, and capabilities available in the ED at this time, and I doubt any other [facility] requiring further screening and/or treatment in the ED prior to admission.  MDM Reviewed: nursing note, vitals and previous chart Reviewed previous: labs, x-ray and CT scan Interpretation:  labs and x-ray Consults: hospitalist.         Nicoletta Dress. Colon Branch, MD 06/07/11 [phone]

## 2011-06-07 NOTE — H&P (Signed)
[name] is an 61 y.o. male.    PCP: [name], MD, MD  He is followed by Dr. Mariel Sleet, who is his oncologist.  Chief Complaint: Abdominal pain, bloating, and nausea  HPI: This is the 61 year old, Caucasian male, with a past medical history of colon cancer, status post surgery, as well as chemotherapy, who has had numerous abdominal surgeries for various reasons. He also has a history of PE. He was last admitted on March 11 for similar complaints. He comes in with complaints of abdominal pain, increased bloating and nausea. He hasn't had any vomiting yet. Denies passing any gas from below, but did have a loose stool on Sunday. Denies any fever or chills. The pain is recurring, mostly on the left side of his abdomen and is 7/10 in intensity. Is aching pain with no radiation. The pain increases with standing or movement. Denies any urinary complaints. Hasn't been significantly constipated in the last few days. His last chemotherapy was in October. He tells me he was diagnosed with colon cancer in May of 2012. His surgeon was Dr. Leticia Penna.   Home Medications: Prior to Admission medications   Medication Sig Start Date End Date Taking? Authorizing Provider  cyanocobalamin (,VITAMIN B-12,) 1000 MCG/ML injection Inject 1,000 mcg into the muscle every 30 (thirty) days.   Yes Historical Provider, MD  dexlansoprazole (DEXILANT) 60 MG capsule Take 1 capsule (60 mg total) by mouth daily. 03/22/11  Yes Tiffany Kocher, PA  enoxaparin (LOVENOX) 60 MG/0.6ML SOLN Inject 60 mg into the skin at bedtime.   Yes Historical Provider, MD  folic acid (FOLVITE) 1 MG tablet Take 1 mg by mouth daily.     Yes Historical Provider, MD  LORazepam (ATIVAN) 1 MG tablet Take 1 mg by mouth daily as needed. For anxiety 04/12/11  Yes Historical Provider, MD  Polyethylene Glycol 3350 (MIRALAX PO) Take by mouth.   Yes Historical Provider, MD  prochlorperazine (COMPAZINE) 10 MG tablet Take 10 mg by mouth every 6 (six)  hours as needed. Nausea and Vomiting   Yes Historical Provider, MD  sertraline (ZOLOFT) 50 MG tablet Take 50 mg by mouth at bedtime.    Yes Historical Provider, MD  thiamine 100 MG tablet Take 100 mg by mouth daily.     Yes Historical Provider, MD  traMADol (ULTRAM) 50 MG tablet Take 50 mg by mouth 4 (four) times daily as needed. For pain 02/03/11  Yes Historical Provider, MD  valsartan-hydrochlorothiazide (DIOVAN-HCT) 80-12.5 MG per tablet Take 1 tablet by mouth daily.   Yes Historical Provider, MD    Allergies: No Known Allergies  Past Medical History: Past Medical History  Diagnosis Date  . 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 Pulmonary embolism 02/2010  . Acid reflux   . Hypertension   . Osteoporosis   . Arthritis   . TIA (transient ischemic attack) 10/11  . ETOH abuse     quit 03/2010  . 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 Personal history of PE (pulmonary embolism) 10/01/2010  . Blood transfusion   . S/P endoscopy September 28, 2010    erosive reflux esophagitis, Billroth I anatomy  . Angina   . Shortness of breath   . Sleep apnea   . Recurrent upper respiratory infection (URI)   . Anxiety   . Pneumonia   . Anemia   . Ileus     Past Surgical History  Procedure Date  . Hernia repair  right inguinal  . Appendectomy 1980s  . Cholecystectomy 1980s  . Colon surgery May 2012    left hemicolectomy, colon cancer found at time of surgery for bowel obstruction  . Portacath placement   . Abdominal sugery     for bowel obstruction x 8, all in 1980s, except for one in 07/2010  . Esophagogastroduodenoscopy 09/28/2010  . Esophagogastroduodenoscopy 12/01/2010    Cervical web status post dilation, erosive esophagitis, B1 hemigastrectomy, inflamed anastomosis  . Colonoscopy 03/18/2011    anastomosis at 35cm. Several adenomatous polyps removed. Sigmoid diverticulosis. Next TCS 02/2013  . Esophagogastroduodenoscopy 04/16/2011    excoriation at [facility] c/w  trauma/M-W tear, friable gastric anastomosis, dilation efferent limb  . Billroth 1 hemigastrectomy 1980s    per patient for benign duodenal tumor    Social History:  reports that he has been smoking Cigarettes.  He has a 20 pack-year smoking history. He does not have any smokeless tobacco history on file. He reports that he does not drink alcohol or use illicit drugs.  Family History:  Family History  Problem Relation Age of Onset  . Hypertension Mother   . Arthritis Mother   . Hypertension Father   . Colon cancer Neg Hx     Review of Systems - History obtained from the patient General ROS: negative Psychological ROS: negative Ophthalmic ROS: negative ENT ROS: negative Allergy and Immunology ROS: negative Hematological and Lymphatic ROS: negative Endocrine ROS: negative Respiratory ROS: negative Cardiovascular ROS: negative Gastrointestinal ROS: as in hpi Genito-Urinary ROS: negative Musculoskeletal ROS: negative Neurological ROS: negative Dermatological ROS: negative  Physical Examination Blood pressure 134/82, pulse 84, temperature 98.2 F (36.8 C), resp. rate 20, height 5\' 9"  (1.753 m), weight 65.772 kg (145 lb), SpO2 98.00%.  General appearance: alert, cooperative, appears stated age and no distress Head: Normocephalic, without obvious abnormality, atraumatic Eyes: conjunctivae/corneas clear. PERRL, EOM's intact.  Throat: lips, mucosa, and tongue normal; teeth and gums normal Neck: no adenopathy, no carotid bruit, no JVD, supple, symmetrical, trachea midline and thyroid not enlarged, symmetric, no tenderness/mass/nodules Resp: clear to auscultation bilaterally Cardio: regular rate and rhythm, S1, S2 normal, no murmur, click, rub or gallop GI: Soft. Tender in the left side of the abdomen. No rebound, rigidity, or guarding. Bowel sounds are sluggish. No masses, organomegaly. Scars from previous surgeries noted. Extremities: extremities normal, atraumatic, no cyanosis or  edema Pulses: 2+ and symmetric Skin: Skin color, texture, turgor normal. No rashes or lesions Lymph nodes: Cervical, supraclavicular, and axillary nodes normal. Neurologic: Grossly normal  Laboratory Data: Results for orders placed during the hospital encounter of 06/07/11 (from the past 48 hour(s))  CBC     Status: Abnormal   Collection Time   06/07/11  1:55 AM      Component Value Range Comment   WBC 9.5  4.0 - 10.5 (K/uL)    RBC 3.72 (*) 4.22 - 5.81 (MIL/uL)    Hemoglobin 11.6 (*) 13.0 - 17.0 (g/dL)    HCT 16.1 (*) 09.6 - 52.0 (%)    MCV 94.4  78.0 - 100.0 (fL)    MCH 31.2  26.0 - 34.0 (pg)    MCHC 33.0  30.0 - 36.0 (g/dL)    RDW 04.5  40.9 - 81.1 (%)    Platelets 216  150 - 400 (K/uL)   DIFFERENTIAL     Status: Normal   Collection Time   06/07/11  1:55 AM      Component Value Range Comment   Neutrophils Relative 57  43 -  77 (%)    Neutro Abs 5.4  1.7 - 7.7 (K/uL)    Lymphocytes Relative 33  12 - 46 (%)    Lymphs Abs 3.2  0.7 - 4.0 (K/uL)    Monocytes Relative 9  3 - 12 (%)    Monocytes Absolute 0.8  0.1 - 1.0 (K/uL)    Eosinophils Relative 1  0 - 5 (%)    Eosinophils Absolute 0.1  0.0 - 0.7 (K/uL)    Basophils Relative 0  0 - 1 (%)    Basophils Absolute 0.0  0.0 - 0.1 (K/uL)   COMPREHENSIVE METABOLIC PANEL     Status: Abnormal   Collection Time   06/07/11  1:55 AM      Component Value Range Comment   Sodium 139  135 - 145 (mEq/L)    Potassium 3.9  3.5 - 5.1 (mEq/L)    Chloride 105  96 - 112 (mEq/L)    CO2 26  19 - 32 (mEq/L)    Glucose, Bld 88  70 - 99 (mg/dL)    BUN 12  6 - 23 (mg/dL)    Creatinine, Ser 8.46  0.50 - 1.35 (mg/dL)    Calcium 8.7  8.4 - 10.5 (mg/dL)    Total Protein 6.0  6.0 - 8.3 (g/dL)    Albumin 3.2 (*) 3.5 - 5.2 (g/dL)    AST 15  0 - 37 (U/L)    ALT 10  0 - 53 (U/L)    Alkaline Phosphatase 88  39 - 117 (U/L)    Total Bilirubin 0.2 (*) 0.3 - 1.2 (mg/dL)    GFR calc non Af Amer >90  >90 (mL/min)    GFR calc Af Amer >90  >90 (mL/min)      Radiology Reports: Dg Abd Acute W/chest  06/07/2011  *RADIOLOGY REPORT*  Clinical Data: Nausea.  Left-sided abdominal pain.  ACUTE ABDOMEN SERIES (ABDOMEN 2 VIEW & CHEST 1 VIEW)  Comparison: 13/11/13  Findings:  Left chest wall porta-catheter is noted with tip in the SVC.  Heart size and mediastinal contours are normal.  No pleural effusion or edema.  No airspace consolidation.  Prominent stool is identified within the left colon.  Again identified is distention of the small bowel and proximal half of the colon.  Compared with previous exam the degree of bowel distention is not significantly changed.  IMPRESSION:  1.  No change in abnormal bowel distention compared with 05/31/2011. 2.  No acute cardiopulmonary abnormalities.  Original Report Authenticated By: Rosealee Albee, M.D.    Assessment/Plan  Principal Problem:  *Abdominal distension Active Problems:  Adenocarcinoma of colon with mucinous features  Anemia, chronic disease  Pulmonary embolism  Nausea  HTN (hypertension)   #1 abdominal distention and pain: His acute abdominal series does show distention of his bowels. He had a CT scan just a few days ago. At this time we will not repeat a CT. Since this is the third admission within the last one month for same reason we will consult Dr. Leticia Penna to evaluate him. We'll keep him n.p.o. except ice chips for now.  #2 history of pulmonary embolism: Continue with Lovenox.  #3 history of hypertension continue with antihypertensive regimen.  IV fluids will be provided. He is a full code.   Other issues are stable.  Further management decisions will depend on results of further testing and patient's response to treatment.  [facility]  [facility] Pager [phone]  06/07/2011, 4:03 AM

## 2011-06-07 NOTE — Plan of Care (Signed)
Problem: Consults Goal: General Medical Patient Education See Patient Education Module for specific education. Outcome: Progressing Admitted with abdominal pain to left side  Problem: Phase I Progression Outcomes Goal: Pain controlled with appropriate interventions Outcome: Progressing Pain rated a 5 on admission Goal: OOB as tolerated unless otherwise ordered Outcome: Progressing Patient is ambulatory, steady, Goal: Initial discharge plan identified Outcome: Progressing Home with spouse

## 2011-06-07 NOTE — Progress Notes (Signed)
Patient admitted earlier this morning by Dr. Rito Ehrlich for abd distention/pain and constipation  He is well known to our service.  Patient seen and examined, database reviewed.  Will start enemas and continue with miralax.  Dr. Leticia Penna has been consulted.  He is NPO except ice chips for now, advance diet as tolerated

## 2011-06-08 LAB — CBC
HCT: 36.7 % — ABNORMAL LOW (ref 39.0–52.0)
Hemoglobin: 12.1 g/dL — ABNORMAL LOW (ref 13.0–17.0)
MCH: 31.1 pg (ref 26.0–34.0)
MCV: 94.3 fL (ref 78.0–100.0)
RBC: 3.89 MIL/uL — ABNORMAL LOW (ref 4.22–5.81)
WBC: 9.1 10*3/uL (ref 4.0–10.5)

## 2011-06-08 LAB — COMPREHENSIVE METABOLIC PANEL
AST: 20 U/L (ref 0–37)
BUN: 5 mg/dL — ABNORMAL LOW (ref 6–23)
CO2: 30 mEq/L (ref 19–32)
Calcium: 8.9 mg/dL (ref 8.4–10.5)
Chloride: 104 mEq/L (ref 96–112)
Creatinine, Ser: 0.88 mg/dL (ref 0.50–1.35)
GFR calc Af Amer: >90 mL/min (ref >90)
GFR calc non Af Amer: >90 mL/min (ref >90)
Glucose, Bld: 105 mg/dL — ABNORMAL HIGH (ref 70–99)
Total Bilirubin: 0.4 mg/dL (ref 0.3–1.2)

## 2011-06-08 MED ORDER — MORPHINE SULFATE 2 MG/ML IJ SOLN
1.0000 mg | Freq: Four times a day (QID) | INTRAMUSCULAR | Status: DC | PRN
  Administered 2011-06-08 (×2): 1 mg via INTRAVENOUS
  Filled 2011-06-08 (×2): qty 1

## 2011-06-08 MED ORDER — SODIUM CHLORIDE 0.9 % IJ SOLN
INTRAMUSCULAR | Status: AC
  Filled 2011-06-08: qty 3

## 2011-06-08 MED ORDER — HEPARIN SOD (PORK) LOCK FLUSH 100 UNIT/ML IV SOLN
500.0000 [IU] | Freq: Once | INTRAVENOUS | Status: DC
  Filled 2011-06-08: qty 5

## 2011-06-08 NOTE — Discharge Instructions (Signed)
Abdominal Pain Abdominal pain can be caused by many things. Your caregiver decides the seriousness of your pain by an examination and possibly blood tests and X-rays. Many cases can be observed and treated at home. Most abdominal pain is not caused by a disease and will probably improve without treatment. However, in many cases, more time must pass before a clear cause of the pain can be found. Before that point, it may not be known if you need more testing, or if hospitalization or surgery is needed. HOME CARE INSTRUCTIONS   Do not take laxatives unless directed by your caregiver.   Take pain medicine only as directed by your caregiver.   Only take over-the-counter or prescription medicines for pain, discomfort, or fever as directed by your caregiver.   Try a clear liquid diet (broth, tea, or water) for as long as directed by your caregiver. Slowly move to a bland diet as tolerated.  SEEK IMMEDIATE MEDICAL CARE IF:   The pain does not go away.   You have a fever.   You keep throwing up (vomiting).   The pain is felt only in portions of the abdomen. Pain in the right side could possibly be appendicitis. In an adult, pain in the left lower portion of the abdomen could be colitis or diverticulitis.   You pass bloody or black tarry stools.  MAKE SURE YOU:   Understand these instructions.   Will watch your condition.   Will get help right away if you are not doing well or get worse.  Document Released: 12/16/2004 Document Revised: 02/25/2011 Document Reviewed: 10/25/2007 [facility] Patient Information 2012 [place], [place].Abdominal Pain (Nonspecific) Your exam might not show the exact reason you have abdominal pain. Since there are many different causes of abdominal pain, another checkup and more tests may be needed. It is very important to follow up for lasting (persistent) or worsening symptoms. A possible cause of abdominal pain in any person who still has his or her appendix is acute  appendicitis. Appendicitis is often hard to diagnose. Normal blood tests, urine tests, ultrasound, and CT scans do not completely rule out early appendicitis or other causes of abdominal pain. Sometimes, only the changes that happen over time will allow appendicitis and other causes of abdominal pain to be determined. Other potential problems that may require surgery may also take time to become more apparent. Because of this, it is important that you follow all of the instructions below. HOME CARE INSTRUCTIONS   Rest as much as possible.   Do not eat solid food until your pain is gone.   While adults or children have pain: A diet of water, weak decaffeinated tea, broth or bouillon, gelatin, oral rehydration solutions (ORS), frozen ice pops, or ice chips may be helpful.   When pain is gone in adults or children: Start a light diet (dry toast, crackers, applesauce, or white rice). Increase the diet slowly as long as it does not bother you. Eat no dairy products (including cheese and eggs) and no spicy, fatty, fried, or high-fiber foods.   Use no alcohol, caffeine, or cigarettes.   Take your regular medicines unless your caregiver told you not to.   Take any prescribed medicine as directed.   Only take over-the-counter or prescription medicines for pain, discomfort, or fever as directed by your caregiver. Do not give aspirin to children.  If your caregiver has given you a follow-up appointment, it is very important to keep that appointment. Not keeping the appointment could  result in a permanent injury and/or lasting (chronic) pain and/or disability. If there is any problem keeping the appointment, you must call to reschedule.  SEEK IMMEDIATE MEDICAL CARE IF:   Your pain is not gone in 24 hours.   Your pain becomes worse, changes location, or feels different.   You or your child has an oral temperature above 102 F (38.9 C), not controlled by medicine.   Your baby is older than 3 months  with a rectal temperature of 102 F (38.9 C) or higher.   Your baby is 96 months old or younger with a rectal temperature of 100.4 F (38 C) or higher.   You have shaking chills.   You keep throwing up (vomiting) or cannot drink liquids.   There is blood in your vomit or you see blood in your bowel movements.   Your bowel movements become dark or black.   You have frequent bowel movements.   Your bowel movements stop (become blocked) or you cannot pass gas.   You have bloody, frequent, or painful urination.   You have yellow discoloration in the skin or whites of the eyes.   Your stomach becomes bloated or bigger.   You have dizziness or fainting.   You have chest or back pain.  MAKE SURE YOU:   Understand these instructions.   Will watch your condition.   Will get help right away if you are not doing well or get worse.  Document Released: 03/08/2005 Document Revised: 02/25/2011 Document Reviewed: 02/03/2009 [facility] Patient Information 2012 [place], [place].

## 2011-06-08 NOTE — Progress Notes (Signed)
Pt to be discharged home per MD order. All discharge instructions gone over with patient and patients family member. All questions and concerns answered. Pt discharged home via wheelchair and family member.

## 2011-06-08 NOTE — Progress Notes (Signed)
Subjective: This man has been admitted again with similar symptoms that I sent him home with. He is clearly not obstructed. He is requesting morphine every 4 hours.           Physical Exam: Blood pressure 129/71, pulse 80, temperature 98.8 F (37.1 C), temperature source Oral, resp. rate 20, height 5\' 9"  (1.753 m), weight 64.5 kg (142 lb 3.2 oz), SpO2 98.00%. He looks systemically well. His abdomen is not distended. It is nontender. Bowel sounds are heard. He is nontoxic. Heart sounds are present and normal. Lung fields are clear.   Investigations:  Recent Results (from the past 240 hour(s))  MRSA PCR SCREENING     Status: Normal   Collection Time   06/07/11  6:10 AM      Component Value Range Status Comment   MRSA by PCR NEGATIVE  NEGATIVE  Final      Basic Metabolic Panel:  Basename 06/08/11 0514 06/07/11 0155  NA 141 139  K 4.1 3.9  CL 104 105  CO2 30 26  GLUCOSE 105* 88  BUN 5* 12  CREATININE 0.88 0.85  CALCIUM 8.9 8.7  MG -- --  PHOS -- --   Liver Function Tests:  [facility] 06/08/11 0514 06/07/11 0155  AST 20 15  ALT 13 10  ALKPHOS 88 88  BILITOT 0.4 0.2*  PROT 5.4* 6.0  ALBUMIN 3.0* 3.2*     CBC:  Basename 06/08/11 0514 06/07/11 0155  WBC 9.1 9.5  NEUTROABS -- 5.4  HGB 12.1* 11.6*  HCT 36.7* 35.1*  MCV 94.3 94.4  PLT 205 216    Dg Abd Acute W/chest  06/07/2011  *RADIOLOGY REPORT*  Clinical Data: Nausea.  Left-sided abdominal pain.  ACUTE ABDOMEN SERIES (ABDOMEN 2 VIEW & CHEST 1 VIEW)  Comparison: 13/11/13  Findings:  Left chest wall porta-catheter is noted with tip in the SVC.  Heart size and mediastinal contours are normal.  No pleural effusion or edema.  No airspace consolidation.  Prominent stool is identified within the left colon.  Again identified is distention of the small bowel and proximal half of the colon.  Compared with previous exam the degree of bowel distention is not significantly changed.  IMPRESSION:  1.  No change in abnormal  bowel distention compared with 05/31/2011. 2.  No acute cardiopulmonary abnormalities.  Original Report Authenticated By: Rosealee Albee, M.D.      Medications: I have reviewed the patient's current medications.  Impression: 1. Ileus at best. No clinical or radiological evidence of bowel obstruction. 2. Overuse of opioids.     Plan: 1. Decrease IV fluids. 2. Decrease use of intravenous morphine. 3. Advance diet. 4. Home soon.     LOS: 1 day   [name] Singer Pager [phone]  06/08/2011, 7:54 AM

## 2011-06-08 NOTE — Discharge Summary (Signed)
Physician Discharge Summary  [name] MRN: [id number] DOB/AGE: 1950-12-29 61 y.o. Primary Care Physician:DAVIDSON,ERIC, MD, MD Admit date: 06/07/2011 Discharge date: 06/08/2011    Discharge Diagnoses:  1. Possible ileus, resolved. 2. Overuse of opioids. 3. History of pulmonary embolism on Lovenox. 4. Hypertension. 5. History of colon cancer.   Medication List  As of 06/08/2011 12:35 PM   TAKE these medications         cyanocobalamin 1000 MCG/ML injection   Commonly known as: (VITAMIN B-12)   Inject 1,000 mcg into the muscle every 30 (thirty) days.      dexlansoprazole 60 MG capsule   Commonly known as: DEXILANT   Take 1 capsule (60 mg total) by mouth daily.      enoxaparin 60 MG/0.6ML injection   Commonly known as: LOVENOX   Inject 60 mg into the skin at bedtime.      folic acid 1 MG tablet   Commonly known as: FOLVITE   Take 1 mg by mouth daily.      LORazepam 1 MG tablet   Commonly known as: ATIVAN   Take 1 mg by mouth daily as needed. For anxiety      polyethylene glycol powder powder   Commonly known as: GLYCOLAX/MIRALAX   Take 17 g by mouth as needed. For constipation      prochlorperazine 10 MG tablet   Commonly known as: COMPAZINE   Take 10 mg by mouth every 6 (six) hours as needed. Nausea and Vomiting      sertraline 50 MG tablet   Commonly known as: ZOLOFT   Take 50 mg by mouth at bedtime.      thiamine 100 MG tablet   Take 100 mg by mouth daily.      traMADol 50 MG tablet   Commonly known as: ULTRAM   Take 50 mg by mouth 4 (four) times daily as needed. For pain      valsartan-hydrochlorothiazide 80-12.5 MG per tablet   Commonly known as: DIOVAN-HCT   Take 1 tablet by mouth daily.            Discharged Condition: Stable and improved.    Consults: Surgery, Dr. Leticia Penna.  Significant Diagnostic Studies: Dg Chest 2 View  05/09/2011  *RADIOLOGY REPORT*  Clinical Data: Of cough and shortness of breath  CHEST - 2 VIEW   Comparison: Chest radiograph 03/03/2011  Findings: The left subclavian power port terminates in the proximal superior vena cava, stable.  Borderline cardiomegaly is stable. The pulmonary vascularity is normal.  The lungs are well expanded and clear.  There are no pleural effusions or pneumothorax.  The bones are osteopenic.  No acute bony abnormality is identified. Surgical clips in the upper abdomen.  IMPRESSION: No acute cardiopulmonary disease.  Original Report Authenticated By: Britta Mccreedy, M.D.   Ct Angio Chest W/cm &/or Wo Cm  05/09/2011  *RADIOLOGY REPORT*  Clinical Data: Acute shortness of breath and chest pain.  History of colon cancer and pulmonary embolism.  CT ANGIOGRAPHY CHEST  Technique:  Multidetector CT imaging of the chest using the standard protocol during bolus administration of intravenous contrast. Multiplanar reconstructed images including MIPs were obtained and reviewed to evaluate the vascular anatomy.  Contrast: OMNIPAQUE IOHEXOL 350 MG/ML IV SOLN  Comparison: Chest radiograph 02/17/2013and CT PE study 01/17/2011  Findings: Left chest wall Port-A-Cath is present.  The distal tip of the catheter terminates in the superior vena cava.  Mild cardiomegaly with left ventricular prominence.  Coronary  artery atherosclerotic calcification is noted.  The thoracic aorta is normal in caliber and enhancement.  Negative for dissection.  This is satisfactory evaluation of the pulmonary arterial tree. Central pulmonary arteries are normal in caliber. There are no filling defects in the pulmonary arteries to suggest pulmonary embolism. Specifically, the filling defects seen in the right lower lobe pulmonary arteries on the study of 01/17/2011 have resolved. These pulmonary arteries opacify normally on today's examination.  There is no pleural or pericardial effusion. No lymphadenopathy is identified in the chest.  There is a very thin ostial 1 mm linear filling defect in the right main is 10  bronchus.  This could be a tiny amount of mucus, and was not present on the prior study.  Aside from mild dependent atelectasis., the lungs are clear.  No acute or suspicious bony abnormality.  Mild convex right scoliosis of the upper thoracic spine.  IMPRESSION:  1.  Negative for pulmonary embolism. 2.  Previously identified pulmonary emboli in the right lower lobe on the study of 01/17/2011 have resolved. 2. Question very tiny strand of mucus in the right mainstem bronchus. 3. Coronary artery atherosclerotic calcification and mild of the ventricular prominence.  Original Report Authenticated By: Britta Mccreedy, M.D.   Ct Abdomen Pelvis W Contrast  05/31/2011  *RADIOLOGY REPORT*  Clinical Data: Abdominal pain  CT ABDOMEN AND PELVIS WITH CONTRAST  Technique:  Multidetector CT imaging of the abdomen and pelvis was performed following the standard protocol during bolus administration of intravenous contrast.  Contrast: OMNIPAQUE IOHEXOL 300 MG/ML IJ SOLN  Comparison: 05/13/2011  Findings: Limited images through the lung bases demonstrate no significant appreciable abnormality. The heart size is within normal limits. No pleural or pericardial effusion.  Subcentimeter hypodensity within segment four.  Otherwise homogeneous enhancement of the liver and spleen.  Status post cholecystectomy.  There is mild CBD prominence, measuring up to 9 mm.  Tapers to the level of the ampulla.  No obstructing lesion is identified.  Similarly, the main pancreatic duct is mildly prominent at 3.5 mm.  No obstructing lesion identified.  No infiltrative or solid pancreatic mass identified.  Unremarkable adrenal glands.  Multiple renal cysts and too small to further characterize hypodensities.  Tiny nonobstructing stone within the lower pole of the right kidney.  Scarring involving the upper pole of the left kidney.  No hydronephrosis or hydroureter.  Similar to prior, there is gaseous distension of both large and small bowel without a  definitive obstruction.  No free intraperitoneal air or fluid.  No lymphadenopathy. There are surgical clips within the left upper quadrant and along the stomach.  Thin-walled bladder.  Prostatomegaly.  There is scattered atherosclerotic calcification of the aorta and its branches. No aneurysmal dilatation.  Osteopenia.  Small fat containing right inguinal hernia.  IMPRESSION: Similar to prior, distension of large and small bowel without an obstructing point identified.  This may represent ileus or partial obstruction pattern.  CBD dilatation up to 9 mm status post cholecystectomy. Nonspecific.  Correlate with LFTs and ERCP / MRCP if clinically warranted.  Original Report Authenticated By: Waneta Martins, M.D.   Ct Abdomen Pelvis W Contrast  05/13/2011  *RADIOLOGY REPORT*  Clinical Data: Abdominal distention.  Left-sided abdominal pain.  CT ABDOMEN AND PELVIS WITH CONTRAST  Technique:  Multidetector CT imaging of the abdomen and pelvis was performed following the standard protocol during bolus administration of intravenous contrast.  Contrast: OMNIPAQUE IOHEXOL 300 MG/ML IV SOLN  Comparison: 04/16/2011  Findings:  Small hiatal hernia.  Heart is mildly enlarged.  Scarring in the right lung base.  No effusions.  Prior cholecystectomy.  Postoperative changes in the region of the stomach and proximal small bowel.  Surgical changes also near the splenic flexure of the colon.  There is a nonspecific bowel gas pattern.  Mild gaseous distention of both large and small bowel.  No convincing evidence for obstruction as there is no well-defined transition point. Contrast has passed into the proximal colon.  Liver, spleen, pancreas, adrenals and left kidney are unremarkable.  Benign-appearing cyst in the mid pole of the right kidney.  No hydronephrosis.  Prostate is mildly prominent.  Urinary bladder grossly unremarkable.  Aorta and iliac vessels are calcified, non- aneurysmal.  IMPRESSION: Mild diffuse gaseous  distention of both large and small bowel.  No convincing evidence for obstruction.  Oral contrast material has passed into the colon.  No definite acute process.  Original Report Authenticated By: Cyndie Chime, M.D.   Dg Chest Port 1 View  05/26/2011  *RADIOLOGY REPORT*  Clinical Data: Hemoptysis, nausea, vomiting, history smoking, carcinoma of the colon  PORTABLE CHEST - 1 VIEW  Comparison: Portable exam 2250 hours compared to 05/09/2011  Findings: Left subclavian power port, tip projecting over SVC. Normal heart size and pulmonary vascularity for technique. Tortuous aorta. Lungs appear mildly emphysematous but clear. No pleural effusion or pneumothorax. No acute osseous findings.  IMPRESSION: No acute abnormalities.  Original Report Authenticated By: Lollie Marrow, M.D.   Dg Abd 2 Views  05/28/2011  *RADIOLOGY REPORT*  Clinical Data: History of small bowel obstruction.  ABDOMEN - 2 VIEW  Comparison: CT abdomen pelvis 05/13/2011 and chest and two views abdomen 12/04/2010.  Findings: There is marked gaseous distention of the colon.  No small bowel dilatation is seen.  No free intraperitoneal air.  IMPRESSION: Bowel gas pattern most consistent with colonic ileus.  Original Report Authenticated By: Bernadene Bell. D'ALESSIO, M.D.   Dg Abd Acute W/chest  06/07/2011  *RADIOLOGY REPORT*  Clinical Data: Nausea.  Left-sided abdominal pain.  ACUTE ABDOMEN SERIES (ABDOMEN 2 VIEW & CHEST 1 VIEW)  Comparison: 13/11/13  Findings:  Left chest wall porta-catheter is noted with tip in the SVC.  Heart size and mediastinal contours are normal.  No pleural effusion or edema.  No airspace consolidation.  Prominent stool is identified within the left colon.  Again identified is distention of the small bowel and proximal half of the colon.  Compared with previous exam the degree of bowel distention is not significantly changed.  IMPRESSION:  1.  No change in abnormal bowel distention compared with 05/31/2011. 2.  No acute  cardiopulmonary abnormalities.  Original Report Authenticated By: Rosealee Albee, M.D.   Dg Abd Acute W/chest  05/31/2011  *RADIOLOGY REPORT*  Clinical Data: Abdominal pain  ACUTE ABDOMEN SERIES (ABDOMEN 2 VIEW & CHEST 1 VIEW)  Comparison: 05/28/2011  Findings: Left subclavian Port-A-Cath with its tip in the SVC. Normal heart size.  Clear lungs.  There is no free intraperitoneal gas.  Colonic distention is stable.  There is increasing distention of small bowel loops within the central abdomen. Air-fluid levels in both small and large bowel are noted.  Stool is present in the descending colon. Postoperative changes from bowel resection and anastomosis are noted.  IMPRESSION: Prominent stool in the descending colon.  Proximal colon and small bowel are dilated with air-fluid levels. There is no free intraperitoneal gas.  Original Report Authenticated By: Donavan Burnet, M.D.  Lab Results: Basic Metabolic Panel:  Basename 06/08/11 0514 06/07/11 0155  NA 141 139  K 4.1 3.9  CL 104 105  CO2 30 26  GLUCOSE 105* 88  BUN 5* 12  CREATININE 0.88 0.85  CALCIUM 8.9 8.7  MG -- --  PHOS -- --   Liver Function Tests:  [facility] 06/08/11 0514 06/07/11 0155  AST 20 15  ALT 13 10  ALKPHOS 88 88  BILITOT 0.4 0.2*  PROT 5.4* 6.0  ALBUMIN 3.0* 3.2*     CBC:  Basename 06/08/11 0514 06/07/11 0155  WBC 9.1 9.5  NEUTROABS -- 5.4  HGB 12.1* 11.6*  HCT 36.7* 35.1*  MCV 94.3 94.4  PLT 205 216    Recent Results (from the past 240 hour(s))  MRSA PCR SCREENING     Status: Normal   Collection Time   06/07/11  6:10 AM      Component Value Range Status Comment   MRSA by PCR NEGATIVE  NEGATIVE  Final      Hospital Course: This 61 year old man presents to the hospital once again. He had only been discharged on 06/01/2011. He presented to the hospital 6 days later with similar symptoms. Once again, on evaluation, there was no clinical or radiological evidence of obstruction of the bowel. He was  initially treated with intravenous fluids and conservatively with n.p.o./ice chips. This morning we have progressed into a diet and he has tolerated this. He has been requesting intravenous opioids every 4 hours on the clock. There is no clinical evidence whatsoever of bowel obstruction. His lab work has been unremarkable. He is ready for discharge.  Discharge Exam: Blood pressure 129/71, pulse 75, temperature 98.1 F (36.7 C), temperature source Oral, resp. rate 20, height 5\' 9"  (1.753 m), weight 64.5 kg (142 lb 3.2 oz), SpO2 95.00%. He looks systemically well. His abdomen is soft and nontender. It is not distended. Bowel sounds are heard. Lung fields are clear. He is alert and orientated.  Disposition: Home. We will keep his followup appointments with oncology.  Discharge Orders    Future Appointments: Provider: Department: Dept Phone: Center:   07/08/2011 1:00 PM [facility] [phone] None   08/23/2011 10:00 AM [facility] [phone] None   09/02/2011 1:00 PM [facility] [phone] None   10/28/2011 1:00 PM [facility] [phone] None   11/23/2011 10:00 AM [facility] [phone] None   11/23/2011 11:30 AM Ellouise Newer, [facility] [phone] None   02/22/2012 10:00 AM [facility] [phone] None     Future Orders Please Complete By Expires   Diet - low sodium heart healthy      Increase activity slowly         Follow-up Information    Follow up with [facility], MD .         SignedWilson Singer Pager [phone]  06/08/2011, 12:35 PM

## 2011-06-08 NOTE — Progress Notes (Signed)
  Subjective: Nausea better.  + flatus.  No BM last 24 hours.  Tolerating PO.  No fever or chills.  Objective: Vital signs in last 24 hours: Temp:  [98.1 F (36.7 C)-98.8 F (37.1 C)] 98.1 F (36.7 C) (03/19 1200) Pulse Rate:  [75-80] 75  (03/19 1200) Resp:  [20] 20  (03/19 1200) BP: (118-129)/(66-71) 129/71 mmHg (03/19 0400) SpO2:  [95 %-98 %] 95 % (03/19 1200) Last BM Date: 06/07/11  Intake/Output from previous day: 03/18 0701 - 03/19 0700 In: 1804 [I.V.:1800; IV Piggyback:4] Out: 3250 [Urine:3250] Intake/Output this shift: Total I/O In: 100 [P.O.:100] Out: 550 [Urine:550]  General appearance: alert and no distress GI: +BS, soft, flat.  Non-tender.  No masses.  Lab Results:   [facility] 06/08/11 0514 06/07/11 0155  WBC 9.1 9.5  HGB 12.1* 11.6*  HCT 36.7* 35.1*  PLT 205 216   BMET  Basename 06/08/11 0514 06/07/11 0155  NA 141 139  K 4.1 3.9  CL 104 105  CO2 30 26  GLUCOSE 105* 88  BUN 5* 12  CREATININE 0.88 0.85  CALCIUM 8.9 8.7   PT/INR  Basename 06/07/11 1023  LABPROT 13.1  INR 0.97   ABG No results found for this basename: PHART:2,PCO2:2,PO2:2,HCO3:2 in the last 72 hours  Studies/Results: Dg Abd Acute W/chest  06/07/2011  *RADIOLOGY REPORT*  Clinical Data: Nausea.  Left-sided abdominal pain.  ACUTE ABDOMEN SERIES (ABDOMEN 2 VIEW & CHEST 1 VIEW)  Comparison: 13/11/13  Findings:  Left chest wall porta-catheter is noted with tip in the SVC.  Heart size and mediastinal contours are normal.  No pleural effusion or edema.  No airspace consolidation.  Prominent stool is identified within the left colon.  Again identified is distention of the small bowel and proximal half of the colon.  Compared with previous exam the degree of bowel distention is not significantly changed.  IMPRESSION:  1.  No change in abnormal bowel distention compared with 05/31/2011. 2.  No acute cardiopulmonary abnormalities.  Original Report Authenticated By: Rosealee Albee, M.D.     Anti-infectives: Anti-infectives     Start     Dose/Rate Route Frequency Ordered Stop   06/07/11 1700   erythromycin (E-MYCIN) tablet 250 mg        250 mg Oral 3 times daily with meals & bedtime 06/07/11 1515            Assessment/Plan: s/p * No surgery found * Continue to await improve bowel function.  Continue erythromycin.  Advance diet as tolerated.  LOS: 1 day    Theoplis Garciagarcia C 06/08/2011

## 2011-07-08 ENCOUNTER — Encounter (HOSPITAL_COMMUNITY): Payer: Federal, State, Local not specified - PPO | Attending: Oncology

## 2011-07-08 ENCOUNTER — Other Ambulatory Visit (HOSPITAL_COMMUNITY): Payer: Self-pay | Admitting: Oncology

## 2011-07-08 DIAGNOSIS — C189 Malignant neoplasm of colon, unspecified: Secondary | ICD-10-CM

## 2011-07-08 MED ORDER — HEPARIN SOD (PORK) LOCK FLUSH 100 UNIT/ML IV SOLN
500.0000 [IU] | Freq: Once | INTRAVENOUS | Status: AC
  Administered 2011-07-08: 500 [IU] via INTRAVENOUS
  Filled 2011-07-08: qty 5

## 2011-07-08 MED ORDER — HEPARIN SOD (PORK) LOCK FLUSH 100 UNIT/ML IV SOLN
INTRAVENOUS | Status: AC
  Filled 2011-07-08: qty 5

## 2011-07-08 MED ORDER — SODIUM CHLORIDE 0.9 % IJ SOLN
INTRAMUSCULAR | Status: AC
  Filled 2011-07-08: qty 10

## 2011-07-08 MED ORDER — SODIUM CHLORIDE 0.9 % IJ SOLN
10.0000 mL | Freq: Once | INTRAMUSCULAR | Status: AC
  Administered 2011-07-08: 10 mL via INTRAVENOUS
  Filled 2011-07-08: qty 10

## 2011-07-08 NOTE — Progress Notes (Signed)
[name] presented for Portacath access and flush. Proper placement of portacath confirmed by CXR. Portacath located left chest wall accessed with  H 20 needle. Good blood return present. Portacath flushed with 20ml NS and 500U/5ml Heparin and needle removed intact. Procedure without incident. Patient tolerated procedure well.   

## 2011-07-09 NOTE — Progress Notes (Signed)
Labs drawn

## 2011-07-10 NOTE — ED Provider Notes (Signed)
Medical screening examination/treatment/procedure(s) were performed by non-physician practitioner and as supervising physician I was immediately available for consultation/collaboration.   Loren Racer, MD 07/10/11 [phone]

## 2011-07-21 ENCOUNTER — Emergency Department (HOSPITAL_COMMUNITY): Payer: Federal, State, Local not specified - PPO

## 2011-07-21 ENCOUNTER — Emergency Department (HOSPITAL_COMMUNITY)
Admission: EM | Admit: 2011-07-21 | Discharge: 2011-07-22 | Disposition: A | Discharge status: Home / Self Care | Payer: Federal, State, Local not specified - PPO | Attending: Emergency Medicine | Admitting: Emergency Medicine

## 2011-07-21 ENCOUNTER — Encounter (HOSPITAL_COMMUNITY): Payer: Self-pay | Admitting: *Deleted

## 2011-07-21 DIAGNOSIS — I10 Essential (primary) hypertension: Secondary | ICD-10-CM | POA: Insufficient documentation

## 2011-07-21 DIAGNOSIS — Z85038 Personal history of other malignant neoplasm of large intestine: Secondary | ICD-10-CM | POA: Insufficient documentation

## 2011-07-21 DIAGNOSIS — Z8673 Personal history of transient ischemic attack (TIA), and cerebral infarction without residual deficits: Secondary | ICD-10-CM | POA: Insufficient documentation

## 2011-07-21 DIAGNOSIS — R109 Unspecified abdominal pain: Secondary | ICD-10-CM | POA: Insufficient documentation

## 2011-07-21 DIAGNOSIS — F17200 Nicotine dependence, unspecified, uncomplicated: Secondary | ICD-10-CM | POA: Insufficient documentation

## 2011-07-21 DIAGNOSIS — R5381 Other malaise: Secondary | ICD-10-CM | POA: Insufficient documentation

## 2011-07-21 DIAGNOSIS — R0602 Shortness of breath: Secondary | ICD-10-CM | POA: Insufficient documentation

## 2011-07-21 DIAGNOSIS — J40 Bronchitis, not specified as acute or chronic: Secondary | ICD-10-CM | POA: Insufficient documentation

## 2011-07-21 DIAGNOSIS — R079 Chest pain, unspecified: Secondary | ICD-10-CM | POA: Insufficient documentation

## 2011-07-21 DIAGNOSIS — G8929 Other chronic pain: Secondary | ICD-10-CM | POA: Insufficient documentation

## 2011-07-21 HISTORY — DX: Other chronic pain: G89.29

## 2011-07-21 HISTORY — DX: Ulcer of esophagus without bleeding: K22.10

## 2011-07-21 HISTORY — DX: Gastro-esophageal reflux disease without esophagitis: K21.9

## 2011-07-21 HISTORY — DX: Chest pain, unspecified: R07.9

## 2011-07-21 HISTORY — DX: Unspecified abdominal pain: R10.9

## 2011-07-21 LAB — BASIC METABOLIC PANEL
CO2: 25 mEq/L (ref 19–32)
Calcium: 9.3 mg/dL (ref 8.4–10.5)
GFR calc non Af Amer: >90 mL/min (ref >90)
Glucose, Bld: 88 mg/dL (ref 70–99)
Potassium: 4.2 mEq/L (ref 3.5–5.1)
Sodium: 140 mEq/L (ref 135–145)

## 2011-07-21 LAB — CBC
Hemoglobin: 14.1 g/dL (ref 13.0–17.0)
MCH: 30.8 pg (ref 26.0–34.0)
Platelets: 201 10*3/uL (ref 150–400)
RBC: 4.58 MIL/uL (ref 4.22–5.81)

## 2011-07-21 LAB — PRO B NATRIURETIC PEPTIDE: Pro B Natriuretic peptide (BNP): 195.9 pg/mL — ABNORMAL HIGH (ref 0–125)

## 2011-07-21 LAB — TROPONIN I: Troponin I: <0.3 ng/mL (ref <0.30)

## 2011-07-21 MED ORDER — SODIUM CHLORIDE 0.9 % IV SOLN
INTRAVENOUS | Status: DC
  Administered 2011-07-21: via INTRAVENOUS

## 2011-07-21 NOTE — ED Provider Notes (Signed)
History  This chart was scribed for Laray Anger, DO by Cherlynn Perches. The patient was seen in room APA02/APA02.   CSN: [id number]  Arrival date & time 07/21/11  2048   First MD Initiated Contact with Patient 07/21/11 2312      Chief Complaint  Patient presents with  . Chest Pain  . Cough     HPI Pt was seen at 2315. [name] is a 61 y.o. male who presents to the Emergency Department complaining of gradual onset and persistence of constant lower mid-sternal chest "pain" that began approx 8 hours PTA (started approx 1500 yesterday).  Pt describes the pain as "someone pushing on it," with radiation downward into his upper mid and LUQ abd areas.  Has been associated with generalized weakness/fatigue, cough, and SOB.  Pt reports that he began feeling weak and tired yesterday morning and has spent the past 2 days resting in bed. This afternoon, his chest pain began while resting, does not worsen with exertion or food intake.  Denies N/V/D, no back pain, no fevers, no rash, no focal motor weakness, no tingling/numbness in extremities.      PCP - Ignacia Palma in [place] Past Medical History  Diagnosis Date  . 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 Pulmonary embolism 02/2010  . Hypertension   . Osteoporosis   . Arthritis   . TIA (transient ischemic attack) 10/11  . ETOH abuse     quit 03/2010  . 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 Personal history of PE (pulmonary embolism) 10/01/2010  . Blood transfusion   . S/P endoscopy September 28, 2010    erosive reflux esophagitis, Billroth I anatomy  . Shortness of breath   . Sleep apnea   . Recurrent upper respiratory infection (URI)   . Anxiety   . Pneumonia   . Anemia   . Ileus   . Chronic abdominal pain   . GERD (gastroesophageal reflux disease)   . Chest pain at rest   . Erosive esophagitis     Past Surgical History  Procedure Date  . Hernia repair     right inguinal  . Appendectomy 1980s  .  Cholecystectomy 1980s  . Colon surgery May 2012    left hemicolectomy, colon cancer found at time of surgery for bowel obstruction  . Portacath placement   . Abdominal sugery     for bowel obstruction x 8, all in 1980s, except for one in 07/2010  . Esophagogastroduodenoscopy 09/28/2010  . Esophagogastroduodenoscopy 12/01/2010    Cervical web status post dilation, erosive esophagitis, B1 hemigastrectomy, inflamed anastomosis  . Colonoscopy 03/18/2011    anastomosis at 35cm. Several adenomatous polyps removed. Sigmoid diverticulosis. Next TCS 02/2013  . Esophagogastroduodenoscopy 04/16/2011    excoriation at GEJ c/w trauma/M-W tear, friable gastric anastomosis, dilation efferent limb  . 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 Hypertension Mother   . Arthritis Mother   . Pneumonia Mother   . Hypertension Father   . Heart attack Father   . Colon cancer Neg Hx     History  Substance Use Topics  . Smoking status: Current Everyday Smoker -- 0.5 packs/day for 40 years    Types: Cigarettes  . Smokeless tobacco: Not on file  . Alcohol Use: No     last drink was Apr 16 2010    Review  of Systems ROS: Statement: All systems negative except as marked or noted in the HPI; Constitutional: Negative for fever and chills. ; ; Eyes: Negative for eye pain, redness and discharge. ; ; ENMT: Negative for ear pain, hoarseness, nasal congestion, sinus pressure and sore throat. ; ; Cardiovascular: +CP. Negative for palpitations, diaphoresis, and peripheral edema. ; ; Respiratory: +cough, SOB. Negative for wheezing and stridor. ; ; Gastrointestinal: Negative for nausea, vomiting, diarrhea, abdominal pain, blood in stool, hematemesis, jaundice and rectal bleeding. . ; ; Genitourinary: Negative for dysuria, flank pain and hematuria. ; ; Musculoskeletal: Negative for back pain and neck pain. Negative for swelling and trauma.; ;  Skin: Negative for pruritus, rash, abrasions, blisters, bruising and skin lesion.; ; Neuro: +generalized weakness/fatigue.  Negative for headache, lightheadedness and neck stiffness. Negative for altered level of consciousness , altered mental status, extremity weakness, paresthesias, involuntary movement, seizure and syncope.      Allergies  Review of patient's allergies indicates no known allergies.  Home Medications   Current Outpatient Rx  Name Route Sig Dispense Refill  . CYANOCOBALAMIN 1000 MCG/ML IJ SOLN Intramuscular Inject 1,000 mcg into the muscle every 30 (thirty) days.    . DEXLANSOPRAZOLE 60 MG PO CPDR Oral Take 1 capsule (60 mg total) by mouth daily. 30 capsule 11  . ENOXAPARIN SODIUM 60 MG/0.6ML [place] SOLN Subcutaneous Inject 60 mg into the skin at bedtime.    [name] FOLIC ACID 1 MG PO TABS Oral Take 1 mg by mouth daily.      [name] POLYETHYLENE GLYCOL 3350 PO POWD Oral Take 17 g by mouth as needed. For constipation    . PROCHLORPERAZINE MALEATE 10 MG PO TABS Oral Take 10 mg by mouth every 6 (six) hours as needed. Nausea and Vomiting    . SERTRALINE HCL 50 MG PO TABS Oral Take 50 mg by mouth at bedtime.     . SUCRALFATE 1 GM/10ML PO SUSP Oral Take 1 g by mouth 4 (four) times daily.    . THIAMINE HCL 100 MG PO TABS Oral Take 100 mg by mouth daily.      [name] VALSARTAN-HYDROCHLOROTHIAZIDE 80-12.5 MG PO TABS Oral Take 1 tablet by mouth daily.    [name] HYDROCODONE-ACETAMINOPHEN 5-325 MG PO TABS  1 tabs PO q6 hours prn pain 15 tablet 0  . SUCRALFATE 1 GM/10ML PO SUSP Oral Take 10 mLs (1 g total) by mouth 4 (four) times daily. 420 mL 2    Triage Vitals: BP 127/70  Pulse 72  Temp(Src) 98.3 F (36.8 C) (Oral)  Resp 18  Ht 5\' 9"  (1.753 m)  Wt 140 lb (63.504 kg)  BMI 20.67 kg/m2  SpO2 96%  Physical Exam 2320: Physical examination:  Nursing notes reviewed; Vital signs and O2 SAT reviewed;  Constitutional: Well developed, Well nourished, Well hydrated, In no acute distress; Head:  Normocephalic,  atraumatic; Eyes: EOMI, PERRL, No scleral icterus; ENMT: Mouth and pharynx normal, Mucous membranes moist; Neck: Supple, Full range of motion, No lymphadenopathy; Cardiovascular: Regular rate and rhythm, No murmur, rub, or gallop; Respiratory: Breath sounds clear & equal bilaterally, No rales, rhonchi, wheezes, or rub, Normal respiratory effort/excursion; Chest: Nontender, Movement normal; Abdomen: Soft, Nontender, Nondistended, Normal bowel sounds; Extremities: Pulses normal, No tenderness, No edema, No calf edema or asymmetry.; Neuro: AA&Ox3, vague historian, Major CN grossly intact. No facial droop, speech clear. No gross focal motor or sensory deficits in extremities.; Skin: Color normal, Warm, Dry, no rash.; Psych:  Affect flat.     ED  Course  Procedures     MDM  MDM Reviewed: previous chart, vitals and nursing note Reviewed previous: ECG and CT scan Interpretation: ECG, CT scan, x-ray and labs    Date: 07/21/2011  Rate: 89  Rhythm: normal sinus rhythm and premature atrial contractions (PAC)  QRS Axis: left  Intervals: normal  ST/T Wave abnormalities: normal  Conduction Disutrbances:none  Narrative Interpretation:  LVH  Old EKG Reviewed: unchanged; no significant changes from previous EKG dated 05/09/2011.   Date: 07/22/2011  Rate: 63  Rhythm: normal sinus rhythm  QRS Axis: normal  Intervals: normal  ST/T Wave abnormalities: normal  Conduction Disutrbances:none  Narrative Interpretation: LVH  Old EKG Reviewed: unchanged from previous EKG today and dated 05/09/2011.   Results for orders placed during the hospital encounter of 07/21/11  CBC      Component Value Range   WBC 9.8  4.0 - 10.5 (K/uL)   RBC 4.58  4.22 - 5.81 (MIL/uL)   Hemoglobin 14.1  13.0 - 17.0 (g/dL)   HCT 45.4  09.8 - 11.9 (%)   MCV 90.8  78.0 - 100.0 (fL)   MCH 30.8  26.0 - 34.0 (pg)   MCHC 33.9  30.0 - 36.0 (g/dL)   RDW 14.7  82.9 - 56.2 (%)   Platelets 201  150 - 400 (K/uL)  BASIC METABOLIC PANEL        Component Value Range   Sodium 140  135 - 145 (mEq/L)   Potassium 4.2  3.5 - 5.1 (mEq/L)   Chloride 105  96 - 112 (mEq/L)   CO2 25  19 - 32 (mEq/L)   Glucose, Bld 88  70 - 99 (mg/dL)   BUN 8  6 - 23 (mg/dL)   Creatinine, Ser 1.30  0.50 - 1.35 (mg/dL)   Calcium 9.3  8.4 - 86.5 (mg/dL)   GFR calc non Af Amer >90  >90 (mL/min)   GFR calc Af Amer >90  >90 (mL/min)  PRO B NATRIURETIC PEPTIDE      Component Value Range   Pro B Natriuretic peptide (BNP) 195.9 (*) 0 - 125 (pg/mL)  TROPONIN I      Component Value Range   Troponin I <0.30  <0.30 (ng/mL)  PROCALCITONIN      Component Value Range   Procalcitonin <0.10    LACTIC ACID, PLASMA      Component Value Range   Lactic Acid, Venous 2.0  0.5 - 2.2 (mmol/L)  TROPONIN I      Component Value Range   Troponin I <0.30  <0.30 (ng/mL)  URINALYSIS, ROUTINE W REFLEX MICROSCOPIC      Component Value Range   Color, Urine YELLOW  YELLOW    APPearance CLEAR  CLEAR    Specific Gravity, Urine <1.005 (*) 1.005 - 1.030    pH 6.0  5.0 - 8.0    Glucose, UA NEGATIVE  NEGATIVE (mg/dL)   Hgb urine dipstick NEGATIVE  NEGATIVE    Bilirubin Urine NEGATIVE  NEGATIVE    Ketones, ur NEGATIVE  NEGATIVE (mg/dL)   Protein, ur NEGATIVE  NEGATIVE (mg/dL)   Urobilinogen, UA 0.2  0.0 - 1.0 (mg/dL)   Nitrite NEGATIVE  NEGATIVE    Leukocytes, UA NEGATIVE  NEGATIVE    Ct Angio Chest W/cm &/or Wo Cm 07/22/2011  *RADIOLOGY REPORT*  Clinical Data: Chest pain, weakness, history of pulmonary embolism; history of adenocarcinoma of the colon and partial gastrectomy  CT ANGIOGRAPHY CHEST  Technique:  Multidetector CT imaging of the chest using  the standard protocol during bolus administration of intravenous contrast. Multiplanar reconstructed images including MIPs were obtained and reviewed to evaluate the vascular anatomy.  Contrast: OMNIPAQUE IOHEXOL 350 MG/ML SOLN  Comparison: Chest CT - 05/09/2011; 01/17/2011; 02/24/2010  Vascular Findings:  There is adequate  opacification of the pulmonary arteries with the main pulmonary artery measuring 384 HU.  No discrete filling defect within the pulmonary arteries to suggest acute pulmonary embolism. Normal caliber of the main pulmonary artery.  Cardiomegaly.  Minimal coronary artery calcifications.  No pericardial effusion.  There is unchanged mild ectasia of the ascending thoracic aorta measuring approximately 3.3 cm in greatest coronal dimension (image 30, series six).  Normal configuration of the aortic arc. The left vertebral artery is incidentally noted to arise directly from the aortic arch.  Normal caliber of the descending thoracic aorta.  No periaortic stranding.  Left subclavian vein approach port catheter tip terminates within the mid SVC.  Nonvascular findings:  Bibasilar dependent ground-glass opacities favored to represent atelectasis.  No focal airspace opacities to suggest pneumonia.  No pleural effusion or pneumothorax. Unchanged approximately 4 mm subpleural nodule adjacent to the left major fissure (image 37) stable since the 02/2010 examination and thus favored to be of benign etiology.  No new pulmonary nodules.  Central airways are patent with resolution of mucus within the right main stem bronchus.  Shoddy mediastinal lymph nodes are not enlarged by CT criteria.  No mediastinal, hilar or axillary lymphadenopathy.  Early arterial phase evaluation of the upper abdomen demonstrates postsurgical changes of the stomach compatible with provided surgical history.  No acute or aggressive osseous abnormalities.  Minimal degenerative change of the lower thoracic spine.  IMPRESSION:  1.  No acute cardiopulmonary disease.  Specifically, no evidence of pulmonary embolism. 2.  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 3.  Approximately 4 mm nodule within the left lower lobe adjacent to the major fissure stable since the most remote  examination from 02/2010 and thus favored to be a benign etiology.  Original Report Authenticated By: Waynard Reeds, M.D.   Chest Portable 1 View 07/21/2011  *RADIOLOGY REPORT*  Clinical Data: Chest pain, weakness, dizziness, history of adenocarcinoma of the colon  PORTABLE CHEST - 1 VIEW  Comparison: 05/26/2011; 05/09/2011; 03/03/2011; chest CT - 05/09/2011  Findings: Grossly unchanged cardiac silhouette and mediastinal contours with mild ectasia/tortuosity of the thoracic aorta. Stable position of support apparatus.  The lungs remain hyperinflated.  No focal airspace opacities.  No definite pleural effusion or pneumothorax.  Grossly unchanged bones.  Surgical clips overlie the upper abdomen.  IMPRESSION: Hyperexpanded lungs without acute cardiopulmonary disease.  Original Report Authenticated By: Waynard Reeds, M.D.     4:56 PM:  Pt is a very vague historian.  Very long d/w pt regarding his symptoms after EPIC chart review.  Pt now endorses to ED staff that he has run out of his vicodin and this discomfort (lower mid-sternal radiating into his mid and LUQ abd) is per his usual longstanding chronic pain pattern.  EKG and troponin negative x2 after constant symptoms for 12 hours; doubt ACS as cause for symptoms.  Will tx cough and SOB with neb (pt continues to smoke cigarettes), tx his chronic pain with dose of vicodin.     4:56 PM:  Pt feels improved after vicodin, GI cocktail, neb.  VSS, is not orthostatic, and has ambulated around the ED with steady gait and  easy resps.  Abd exam continues benign, doubt need for specific abd imaging at this time.  Wants to go home now.  Dx testing d/w pt.  Questions answered.  Verb understanding, agreeable to d/c home with outpt f/u.       I personally performed the services described in this documentation, which was scribed in my presence. The recorded information has been reviewed and considered. Catlin Doria Allison Quarry, DO 07/24/11 1510

## 2011-07-21 NOTE — ED Notes (Signed)
Pt ambulated without difficulties

## 2011-07-21 NOTE — ED Notes (Signed)
Chest pain today, cough,  Dizzy, "chest feels tight",  weak

## 2011-07-22 ENCOUNTER — Emergency Department (HOSPITAL_COMMUNITY): Payer: Federal, State, Local not specified - PPO

## 2011-07-22 ENCOUNTER — Encounter (HOSPITAL_COMMUNITY): Payer: Self-pay | Admitting: Emergency Medicine

## 2011-07-22 LAB — URINALYSIS, ROUTINE W REFLEX MICROSCOPIC
Hgb urine dipstick: NEGATIVE
Leukocytes, UA: NEGATIVE
Nitrite: NEGATIVE
Protein, ur: NEGATIVE mg/dL
Specific Gravity, Urine: <1.005 — ABNORMAL LOW (ref 1.005–1.030)
Urobilinogen, UA: 0.2 mg/dL (ref 0.0–1.0)

## 2011-07-22 LAB — PROCALCITONIN: Procalcitonin: <0.1 ng/mL

## 2011-07-22 MED ORDER — HYDROCODONE-ACETAMINOPHEN 5-325 MG PO TABS: ORAL_TABLET | ORAL | Status: AC

## 2011-07-22 MED ORDER — ALBUTEROL SULFATE (5 MG/ML) 0.5% IN NEBU
5.0000 mg | INHALATION_SOLUTION | Freq: Once | RESPIRATORY_TRACT | Status: AC
  Administered 2011-07-22: 5 mg via RESPIRATORY_TRACT
  Filled 2011-07-22: qty 1

## 2011-07-22 MED ORDER — HYDROCODONE-ACETAMINOPHEN 5-325 MG PO TABS
1.0000 | ORAL_TABLET | Freq: Once | ORAL | Status: AC
  Administered 2011-07-22: 1 via ORAL
  Filled 2011-07-22: qty 1

## 2011-07-22 MED ORDER — ALBUTEROL SULFATE HFA 108 (90 BASE) MCG/ACT IN AERS
2.0000 | INHALATION_SPRAY | RESPIRATORY_TRACT | Status: AC
  Administered 2011-07-22: 2 via RESPIRATORY_TRACT
  Filled 2011-07-22: qty 6.7

## 2011-07-22 MED ORDER — IOHEXOL 350 MG/ML SOLN
100.0000 mL | Freq: Once | INTRAVENOUS | Status: AC | PRN
  Administered 2011-07-22: 100 mL via INTRAVENOUS

## 2011-07-22 MED ORDER — IPRATROPIUM BROMIDE 0.02 % IN SOLN
0.5000 mg | Freq: Once | RESPIRATORY_TRACT | Status: AC
  Administered 2011-07-22: 0.5 mg via RESPIRATORY_TRACT
  Filled 2011-07-22: qty 2.5

## 2011-07-22 MED ORDER — GI COCKTAIL ~~LOC~~
30.0000 mL | Freq: Once | ORAL | Status: AC
  Administered 2011-07-22: 30 mL via ORAL
  Filled 2011-07-22: qty 30

## 2011-07-22 MED ORDER — HEPARIN SOD (PORK) LOCK FLUSH 100 UNIT/ML IV SOLN
INTRAVENOUS | Status: AC
  Administered 2011-07-22: 500 [IU]
  Filled 2011-07-22: qty 5

## 2011-07-22 MED ORDER — HYDROCODONE-ACETAMINOPHEN 5-325 MG PO TABS: ORAL_TABLET | ORAL | Status: DC

## 2011-07-22 NOTE — ED Notes (Signed)
Pt now states pain is the same as his chronic pain that is in the epigastric area and moves into LUQ. Pt states he takes Vicodin at home for this but has been out for a week.

## 2011-07-22 NOTE — Discharge Instructions (Signed)
RESOURCE GUIDE  Dental Problems  Patients with Medicaid: [facility]                     [facility] [address]                                           [address] Phone:  [phone]                                                  Phone:  [phone]  If unable to pay or uninsured, contact:  [facility] or [facility] to become qualified for the adult dental clinic.  Chronic Pain Problems Contact [facility]  [phone] Patients need to be referred by their primary care doctor.  Insufficient Money for Medicine Contact [employer]:  call "211" or [facility] 271-5999.  No Primary Care Doctor Call [facility]  832-8000 Other agencies that provide inexpensive medical care    [facility]  [phone]    [facility]  [phone]    [facility]  [phone]    Women's Clinic  [phone]    [facility]  [phone]    [facility]  [phone]  Psychological Services [facility]  [phone] [facility]  [phone] [facility]   [phone] (emergency services [phone])  Substance Abuse Resources [facility]  336-882-2125 [facility] 336-784-9470 [facility] 336-285-9073 [facility] 336-845-3988 Residential & Outpatient Substance Abuse Program  800-659-3381  Abuse/Neglect [place] Child Abuse Hotline (336) 641-3795 [place] Child Abuse Hotline 800-378-5315 (After Hours)  Emergency Shelter [facility] (336) 271-5985  Maternity Homes [facility] (336) 275-9566 [facility] (704) 372-4663  MRSA Hotline #:   832-7006    [place] [facility]  [facility] [place]     [employer]                          [facility] [address] [place]                       [address]      [address]  [place]                                                [place]                            [place] Phone:  [phone]                                   Phone:  [phone]                 Phone:  [phone]  [facility] Phone:  [phone]    [facility] Child Abuse Hotline 360-503-3222 484-428-3157 (After Hours)   Take the prescription as directed.  Use the albuterol inhaler:  2 to 4 puffs every 4 hours for the next 7 days.  Call your regular medical doctor today to schedule a follow up appointment within the next 2 days.  Return to the Emergency Department immediately sooner if worsening.

## 2011-07-23 LAB — URINE CULTURE
Colony Count: NO GROWTH
Culture: NO GROWTH

## 2011-08-02 ENCOUNTER — Encounter: Payer: Self-pay | Admitting: Oncology

## 2011-08-23 ENCOUNTER — Other Ambulatory Visit (HOSPITAL_COMMUNITY): Payer: Federal, State, Local not specified - PPO

## 2011-09-02 ENCOUNTER — Emergency Department (HOSPITAL_COMMUNITY)
Admission: EM | Admit: 2011-09-02 | Discharge: 2011-09-02 | Disposition: A | Discharge status: Home / Self Care | Payer: Federal, State, Local not specified - PPO | Attending: Emergency Medicine | Admitting: Emergency Medicine

## 2011-09-02 ENCOUNTER — Encounter (HOSPITAL_COMMUNITY): Payer: Self-pay

## 2011-09-02 ENCOUNTER — Encounter (HOSPITAL_COMMUNITY): Payer: Federal, State, Local not specified - PPO | Attending: Oncology

## 2011-09-02 ENCOUNTER — Other Ambulatory Visit: Payer: Self-pay

## 2011-09-02 ENCOUNTER — Emergency Department (HOSPITAL_COMMUNITY): Payer: Federal, State, Local not specified - PPO

## 2011-09-02 DIAGNOSIS — C189 Malignant neoplasm of colon, unspecified: Secondary | ICD-10-CM | POA: Insufficient documentation

## 2011-09-02 DIAGNOSIS — R55 Syncope and collapse: Secondary | ICD-10-CM | POA: Insufficient documentation

## 2011-09-02 DIAGNOSIS — I10 Essential (primary) hypertension: Secondary | ICD-10-CM | POA: Insufficient documentation

## 2011-09-02 DIAGNOSIS — C186 Malignant neoplasm of descending colon: Secondary | ICD-10-CM

## 2011-09-02 DIAGNOSIS — R42 Dizziness and giddiness: Secondary | ICD-10-CM | POA: Insufficient documentation

## 2011-09-02 DIAGNOSIS — R51 Headache: Secondary | ICD-10-CM | POA: Insufficient documentation

## 2011-09-02 DIAGNOSIS — Z8673 Personal history of transient ischemic attack (TIA), and cerebral infarction without residual deficits: Secondary | ICD-10-CM | POA: Insufficient documentation

## 2011-09-02 DIAGNOSIS — R000 Tachycardia, unspecified: Secondary | ICD-10-CM | POA: Insufficient documentation

## 2011-09-02 DIAGNOSIS — Z85038 Personal history of other malignant neoplasm of large intestine: Secondary | ICD-10-CM | POA: Insufficient documentation

## 2011-09-02 LAB — CBC
Hemoglobin: 13.1 g/dL (ref 13.0–17.0)
MCHC: 33 g/dL (ref 30.0–36.0)
Platelets: 173 10*3/uL (ref 150–400)
RDW: 13.8 % (ref 11.5–15.5)

## 2011-09-02 LAB — DIFFERENTIAL
Basophils Absolute: 0 10*3/uL (ref 0.0–0.1)
Basophils Relative: 0 % (ref 0–1)
Monocytes Relative: 10 % (ref 3–12)
Neutro Abs: 5.5 10*3/uL (ref 1.7–7.7)
Neutrophils Relative %: 63 % (ref 43–77)

## 2011-09-02 LAB — BASIC METABOLIC PANEL
Chloride: 103 mEq/L (ref 96–112)
GFR calc Af Amer: >90 mL/min (ref >90)
GFR calc non Af Amer: >90 mL/min (ref >90)
Potassium: 3.8 mEq/L (ref 3.5–5.1)
Sodium: 138 mEq/L (ref 135–145)

## 2011-09-02 LAB — TROPONIN I: Troponin I: <0.3 ng/mL (ref <0.30)

## 2011-09-02 MED ORDER — HEPARIN SOD (PORK) LOCK FLUSH 100 UNIT/ML IV SOLN
500.0000 [IU] | Freq: Once | INTRAVENOUS | Status: AC
  Administered 2011-09-02: 500 [IU] via INTRAVENOUS
  Filled 2011-09-02: qty 5

## 2011-09-02 MED ORDER — KETOROLAC TROMETHAMINE 30 MG/ML IJ SOLN
30.0000 mg | Freq: Once | INTRAMUSCULAR | Status: AC
  Administered 2011-09-02: 30 mg via INTRAVENOUS
  Filled 2011-09-02: qty 1

## 2011-09-02 MED ORDER — SODIUM CHLORIDE 0.9 % IJ SOLN
INTRAMUSCULAR | Status: AC
  Filled 2011-09-02: qty 10

## 2011-09-02 MED ORDER — SODIUM CHLORIDE 0.9 % IJ SOLN
10.0000 mL | INTRAMUSCULAR | Status: DC | PRN
  Administered 2011-09-02: 10 mL via INTRAVENOUS
  Filled 2011-09-02: qty 10

## 2011-09-02 MED ORDER — SODIUM CHLORIDE 0.9 % IV BOLUS (SEPSIS)
1000.0000 mL | Freq: Once | INTRAVENOUS | Status: AC
  Administered 2011-09-02: 1000 mL via INTRAVENOUS

## 2011-09-02 MED ORDER — HEPARIN SOD (PORK) LOCK FLUSH 100 UNIT/ML IV SOLN
INTRAVENOUS | Status: AC
  Filled 2011-09-02: qty 5

## 2011-09-02 NOTE — ED Notes (Signed)
Placed on telemetry

## 2011-09-02 NOTE — Progress Notes (Signed)
[name] presented for Portacath access and flush.  Proper placement of portacath confirmed by CXR.  Portacath located right chest wall accessed with  H 20 needle.  Good blood return present, and labs obtained for CEA. Portacath flushed with 20ml NS and 500U/21ml Heparin and needle removed intact.  Procedure without incident.  Patient tolerated procedure well.

## 2011-09-02 NOTE — ED Notes (Signed)
Pt was leaving facility after having blood work drawn for hx of colon ca. Pt states, " I started to get blurry and I don't remember anything after that" pt was found face down on sidewalk out side. No abrasion or bruise present to head. c-collar and pt boarded for precaution.

## 2011-09-02 NOTE — ED Notes (Signed)
Port-a-cath de-accessed.  Patient ready for discharge.  Family members at bedside.

## 2011-09-02 NOTE — Discharge Instructions (Signed)
Tests were good.  Increase fluids.  Follow up your dr

## 2011-09-02 NOTE — ED Notes (Signed)
Assumed care for discharge only.  Discharge instructions given and reviewed with patient.  Patient verbalized understanding to increase fluids and follow up with his doctor as scheduled.  Patient ambulatory with steady gait.  Discharged home in good condition.  Family members accompanied discharged home in good condition.

## 2011-09-02 NOTE — ED Provider Notes (Signed)
History   This chart was scribed for Donnetta Hutching, MD by Clarita Crane. The patient was seen in room APA11/APA11. Patient's care was started at 1408.    CSN: [id number]  Arrival date & time 09/02/11  1408   First MD Initiated Contact with Patient 09/02/11 1430      Chief Complaint  Patient presents with  . Loss of Consciousness    (Consider location/radiation/quality/duration/timing/severity/associated sxs/prior treatment) HPI [name] is a 61 y.o. male who presents to the Emergency Department for evaluation following moderate to severe syncopal episode which occurred just prior to arrival while patient was walking into ED from his vehicle to be evaluated for HA and dizziness. Patient notes he is currently experiencing dizziness. Denies fever, chills, chest pain, SOB, abdominal pain, head injury. Patient with h/o adenocarcinoma of colon, PE, HTN, TIA, etoh abuse, anemia, pneumonia, cholecystectomy, bowel obstruction.  PCP- Ignacia Palma in [place], [place]  Past Medical History  Diagnosis Date  . 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 Pulmonary embolism 02/2010  . Hypertension   . Osteoporosis   . Arthritis   . TIA (transient ischemic attack) 10/11  . ETOH abuse     quit 03/2010  . 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 Personal history of PE (pulmonary embolism) 10/01/2010  . Blood transfusion   . S/P endoscopy September 28, 2010    erosive reflux esophagitis, Billroth I anatomy  . Shortness of breath   . Sleep apnea   . Recurrent upper respiratory infection (URI)   . Anxiety   . Pneumonia   . Anemia   . Ileus   . Chronic abdominal pain   . GERD (gastroesophageal reflux disease)   . Chest pain at rest   . Erosive esophagitis     Past Surgical History  Procedure Date  . Hernia repair     right inguinal  . Appendectomy 1980s  . Cholecystectomy 1980s  . Colon surgery May 2012    left hemicolectomy, colon cancer found at time of surgery for  bowel obstruction  . Portacath placement   . Abdominal sugery     for bowel obstruction x 8, all in 1980s, except for one in 07/2010  . Esophagogastroduodenoscopy 09/28/2010  . Esophagogastroduodenoscopy 12/01/2010    Cervical web status post dilation, erosive esophagitis, B1 hemigastrectomy, inflamed anastomosis  . Colonoscopy 03/18/2011    anastomosis at 35cm. Several adenomatous polyps removed. Sigmoid diverticulosis. Next TCS 02/2013  . Esophagogastroduodenoscopy 04/16/2011    excoriation at GEJ c/w trauma/M-W tear, friable gastric anastomosis, dilation efferent limb  . 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 Hypertension Mother   . Arthritis Mother   . Pneumonia Mother   . Hypertension Father   . Heart attack Father   . Colon cancer Neg Hx     History  Substance Use Topics  . Smoking status: Current Everyday Smoker -- 0.5 packs/day for 40 years    Types: Cigarettes  . Smokeless tobacco: Not on file  . Alcohol Use: No     last drink was Apr 16 2010      Review of Systems A complete 10 system review of systems was obtained and all systems are negative except as noted in the HPI and PMH.   Allergies  Review of patient's allergies indicates no known allergies.  Home Medications   Current Outpatient Rx  Name Route Sig Dispense Refill  . CYANOCOBALAMIN 1000 MCG/ML IJ SOLN Intramuscular Inject 1,000 mcg into the muscle every 30 (thirty) days.    . DEXLANSOPRAZOLE 60 MG PO CPDR Oral Take 1 capsule (60 mg total) by mouth daily. 30 capsule 11  . FOLIC ACID 1 MG PO TABS Oral Take 1 mg by mouth daily.      [name] POLYETHYLENE GLYCOL 3350 PO POWD Oral Take 17 g by mouth as needed. For constipation    . PROCHLORPERAZINE MALEATE 10 MG PO TABS Oral Take 10 mg by mouth every 6 (six) hours as needed. Nausea and Vomiting    . SERTRALINE HCL 50 MG PO TABS Oral Take 50 mg by mouth at bedtime.     .  THIAMINE HCL 100 MG PO TABS Oral Take 100 mg by mouth daily.      . TRAMADOL HCL 50 MG PO TABS Oral Take 50 mg by mouth every 6 (six) hours as needed. For pain    . VALSARTAN-HYDROCHLOROTHIAZIDE 80-12.5 MG PO TABS Oral Take 1 tablet by mouth daily.    . SUCRALFATE 1 GM/10ML PO SUSP Oral Take 10 mLs (1 g total) by mouth 4 (four) times daily. 420 mL 2    BP 152/100  Pulse 103  Resp 20  Ht 5\' 9"  (1.753 m)  Wt 145 lb (65.772 kg)  BMI 21.41 kg/m2  SpO2 100%  Physical Exam  Nursing note and vitals reviewed. Constitutional: He is oriented to person, place, and time. He appears well-developed and well-nourished. No distress.  HENT:  Head: Normocephalic and atraumatic.  Eyes: EOM are normal. Pupils are equal, round, and reactive to light.  Neck: Neck supple. No tracheal deviation present.  Cardiovascular: Regular rhythm.  Tachycardia present.  Exam reveals no gallop and no friction rub.   No murmur heard. Pulmonary/Chest: Effort normal. No respiratory distress. He has no wheezes.  Abdominal: Soft. He exhibits no distension. There is no tenderness.  Musculoskeletal: Normal range of motion. He exhibits no edema.  Neurological: He is alert and oriented to person, place, and time. No sensory deficit.  Skin: Skin is warm and dry.  Psychiatric: He has a normal mood and affect. His behavior is normal.    ED Course  Procedures (including critical care time)  DIAGNOSTIC STUDIES: Oxygen Saturation is 100% on room air, normal by my interpretation.    COORDINATION OF CARE:  Results for orders placed during the hospital encounter of 09/02/11  CBC      Component Value Range   WBC 8.7  4.0 - 10.5 K/uL   RBC 4.44  4.22 - 5.81 MIL/uL   Hemoglobin 13.1  13.0 - 17.0 g/dL   HCT 40.9  81.1 - 91.4 %   MCV 89.4  78.0 - 100.0 fL   MCH 29.5  26.0 - 34.0 pg   MCHC 33.0  30.0 - 36.0 g/dL   RDW 78.2  95.6 - 21.3 %   Platelets 173  150 - 400 K/uL  DIFFERENTIAL      Component Value Range   Neutrophils  Relative 63  43 - 77 %   Neutro Abs 5.5  1.7 - 7.7 K/uL   Lymphocytes Relative 26  12 - 46 %   Lymphs Abs 2.3  0.7 - 4.0 K/uL   Monocytes Relative 10  3 - 12 %   Monocytes Absolute 0.9  0.1 - 1.0 K/uL   Eosinophils Relative 1  0 - 5 %   Eosinophils Absolute 0.1  0.0 - 0.7 K/uL   Basophils Relative 0  0 - 1 %   Basophils Absolute 0.0  0.0 - 0.1 K/uL  BASIC METABOLIC PANEL      Component Value Range   Sodium 138  135 - 145 mEq/L   Potassium 3.8  3.5 - 5.1 mEq/L   Chloride 103  96 - 112 mEq/L   CO2 23  19 - 32 mEq/L   Glucose, Bld 88  70 - 99 mg/dL   BUN 9  6 - 23 mg/dL   Creatinine, Ser 1.61  0.50 - 1.35 mg/dL   Calcium 8.8  8.4 - 09.6 mg/dL   GFR calc non Af Amer >90  >90 mL/min   GFR calc Af Amer >90  >90 mL/min  TROPONIN I      Component Value Range   Troponin I <0.30  <0.30 ng/mL  Ct Head Wo Contrast  09/02/2011  *RADIOLOGY REPORT*  Clinical Data: Colon cancer, altered mental status  CT HEAD WITHOUT CONTRAST  Technique:  Contiguous axial images were obtained from the base of the skull through the vertex without contrast.  Comparison: 04/14/2010, 04/13/2010  Findings: Stable atrophy.  Slight progression of patchy subcortical and periventricular white matter chronic microvascular ischemic changes.  No definite focal edema or sulcal effacement. Symmetric ventricles.  No hydrocephalus, herniation, or extra-axial fluid collection.  Cisterns patent.  No cerebellar abnormality. Symmetric orbits.  No skull abnormality.  Sinuses and mastoids clear.  IMPRESSION: Atrophy and microvascular ischemic changes throughout the white matter, slightly progressed since 04/14/2010.  No acute intracranial hemorrhage or definite acute infarction.  Original Report Authenticated By: Judie Petit. Ruel Favors, M.D.    Labs Reviewed - No data to display No results found.   No diagnosis found.  Date: 09/02/2011  Rate: 104  Rhythm: sinus tachycardia  QRS Axis: normal  Intervals: normal  ST/T Wave abnormalities:  normal  Conduction Disutrbances:none  Narrative Interpretation:   Old EKG Reviewed: none available    MDM  *recheck at d/c;  Feeling better.  No syncope or neuro def.  Suspect dehydration/hypotension led to syncopal spell     I personally performed the services described in this documentation, which was scribed in my presence. The recorded information has been reviewed and considered.    Donnetta Hutching, MD 09/02/11 1904

## 2011-09-03 LAB — CEA: CEA: 2.9 ng/mL (ref 0.0–5.0)

## 2011-09-10 ENCOUNTER — Inpatient Hospital Stay (HOSPITAL_COMMUNITY)
Admission: EM | Admit: 2011-09-10 | Discharge: 2011-09-12 | DRG: 180 | Disposition: A | Discharge status: Home / Self Care | Payer: Federal, State, Local not specified - PPO | Attending: Internal Medicine | Admitting: Internal Medicine

## 2011-09-10 ENCOUNTER — Encounter (HOSPITAL_COMMUNITY): Payer: Self-pay | Admitting: *Deleted

## 2011-09-10 ENCOUNTER — Emergency Department (HOSPITAL_COMMUNITY): Payer: Federal, State, Local not specified - PPO

## 2011-09-10 DIAGNOSIS — R112 Nausea with vomiting, unspecified: Secondary | ICD-10-CM

## 2011-09-10 DIAGNOSIS — D638 Anemia in other chronic diseases classified elsewhere: Secondary | ICD-10-CM

## 2011-09-10 DIAGNOSIS — L03319 Cellulitis of trunk, unspecified: Secondary | ICD-10-CM

## 2011-09-10 DIAGNOSIS — K920 Hematemesis: Secondary | ICD-10-CM

## 2011-09-10 DIAGNOSIS — R509 Fever, unspecified: Secondary | ICD-10-CM

## 2011-09-10 DIAGNOSIS — Z903 Acquired absence of stomach [part of]: Secondary | ICD-10-CM

## 2011-09-10 DIAGNOSIS — R197 Diarrhea, unspecified: Secondary | ICD-10-CM | POA: Diagnosis present

## 2011-09-10 DIAGNOSIS — R110 Nausea: Secondary | ICD-10-CM | POA: Diagnosis present

## 2011-09-10 DIAGNOSIS — K529 Noninfective gastroenteritis and colitis, unspecified: Secondary | ICD-10-CM

## 2011-09-10 DIAGNOSIS — F411 Generalized anxiety disorder: Secondary | ICD-10-CM | POA: Diagnosis present

## 2011-09-10 DIAGNOSIS — I10 Essential (primary) hypertension: Secondary | ICD-10-CM | POA: Diagnosis present

## 2011-09-10 DIAGNOSIS — D72829 Elevated white blood cell count, unspecified: Secondary | ICD-10-CM | POA: Diagnosis present

## 2011-09-10 DIAGNOSIS — K922 Gastrointestinal hemorrhage, unspecified: Secondary | ICD-10-CM

## 2011-09-10 DIAGNOSIS — Z8673 Personal history of transient ischemic attack (TIA), and cerebral infarction without residual deficits: Secondary | ICD-10-CM

## 2011-09-10 DIAGNOSIS — Z86711 Personal history of pulmonary embolism: Secondary | ICD-10-CM

## 2011-09-10 DIAGNOSIS — K2210 Ulcer of esophagus without bleeding: Secondary | ICD-10-CM

## 2011-09-10 DIAGNOSIS — R791 Abnormal coagulation profile: Secondary | ICD-10-CM

## 2011-09-10 DIAGNOSIS — R140 Abdominal distension (gaseous): Secondary | ICD-10-CM

## 2011-09-10 DIAGNOSIS — K56609 Unspecified intestinal obstruction, unspecified as to partial versus complete obstruction: Principal | ICD-10-CM | POA: Diagnosis present

## 2011-09-10 DIAGNOSIS — I2699 Other pulmonary embolism without acute cor pulmonale: Secondary | ICD-10-CM

## 2011-09-10 DIAGNOSIS — R109 Unspecified abdominal pain: Secondary | ICD-10-CM | POA: Diagnosis present

## 2011-09-10 DIAGNOSIS — G8929 Other chronic pain: Secondary | ICD-10-CM | POA: Diagnosis present

## 2011-09-10 DIAGNOSIS — Z79899 Other long term (current) drug therapy: Secondary | ICD-10-CM

## 2011-09-10 DIAGNOSIS — C189 Malignant neoplasm of colon, unspecified: Secondary | ICD-10-CM | POA: Diagnosis present

## 2011-09-10 DIAGNOSIS — F17200 Nicotine dependence, unspecified, uncomplicated: Secondary | ICD-10-CM | POA: Diagnosis present

## 2011-09-10 DIAGNOSIS — K56600 Partial intestinal obstruction, unspecified as to cause: Secondary | ICD-10-CM | POA: Diagnosis present

## 2011-09-10 DIAGNOSIS — L02219 Cutaneous abscess of trunk, unspecified: Secondary | ICD-10-CM

## 2011-09-10 DIAGNOSIS — K219 Gastro-esophageal reflux disease without esophagitis: Secondary | ICD-10-CM

## 2011-09-10 DIAGNOSIS — D689 Coagulation defect, unspecified: Secondary | ICD-10-CM

## 2011-09-10 DIAGNOSIS — J189 Pneumonia, unspecified organism: Secondary | ICD-10-CM

## 2011-09-10 DIAGNOSIS — M810 Age-related osteoporosis without current pathological fracture: Secondary | ICD-10-CM | POA: Diagnosis present

## 2011-09-10 DIAGNOSIS — J209 Acute bronchitis, unspecified: Secondary | ICD-10-CM

## 2011-09-10 DIAGNOSIS — L03311 Cellulitis of abdominal wall: Secondary | ICD-10-CM

## 2011-09-10 DIAGNOSIS — R079 Chest pain, unspecified: Secondary | ICD-10-CM

## 2011-09-10 DIAGNOSIS — D61810 Antineoplastic chemotherapy induced pancytopenia: Secondary | ICD-10-CM

## 2011-09-10 DIAGNOSIS — L0390 Cellulitis, unspecified: Secondary | ICD-10-CM | POA: Diagnosis present

## 2011-09-10 LAB — COMPREHENSIVE METABOLIC PANEL
ALT: 10 U/L (ref 0–53)
Albumin: 3.9 g/dL (ref 3.5–5.2)
Alkaline Phosphatase: 149 U/L — ABNORMAL HIGH (ref 39–117)
BUN: 7 mg/dL (ref 6–23)
Chloride: 101 mEq/L (ref 96–112)
Glucose, Bld: 96 mg/dL (ref 70–99)
Potassium: 4 mEq/L (ref 3.5–5.1)
Sodium: 138 mEq/L (ref 135–145)
Total Bilirubin: 0.3 mg/dL (ref 0.3–1.2)
Total Protein: 6.9 g/dL (ref 6.0–8.3)

## 2011-09-10 LAB — DIFFERENTIAL
Basophils Relative: 0 % (ref 0–1)
Lymphs Abs: 2.1 10*3/uL (ref 0.7–4.0)
Monocytes Relative: 8 % (ref 3–12)
Neutro Abs: 10.6 10*3/uL — ABNORMAL HIGH (ref 1.7–7.7)
Neutrophils Relative %: 76 % (ref 43–77)

## 2011-09-10 LAB — URINALYSIS, ROUTINE W REFLEX MICROSCOPIC
Bilirubin Urine: NEGATIVE
Glucose, UA: NEGATIVE mg/dL
Hgb urine dipstick: NEGATIVE
Specific Gravity, Urine: 1.02 (ref 1.005–1.030)
Urobilinogen, UA: 0.2 mg/dL (ref 0.0–1.0)

## 2011-09-10 LAB — CBC
Hemoglobin: 14 g/dL (ref 13.0–17.0)
Platelets: 197 10*3/uL (ref 150–400)
RBC: 4.63 MIL/uL (ref 4.22–5.81)

## 2011-09-10 LAB — LIPASE, BLOOD: Lipase: 21 U/L (ref 11–59)

## 2011-09-10 MED ORDER — LIDOCAINE VISCOUS 2 % MT SOLN
OROMUCOSAL | Status: AC
  Filled 2011-09-10: qty 15

## 2011-09-10 MED ORDER — VANCOMYCIN HCL IN DEXTROSE 1-5 GM/200ML-% IV SOLN
1000.0000 mg | Freq: Once | INTRAVENOUS | Status: AC
  Administered 2011-09-10: 1000 mg via INTRAVENOUS
  Filled 2011-09-10: qty 200

## 2011-09-10 MED ORDER — ONDANSETRON HCL 4 MG PO TABS: 4.0000 mg | ORAL_TABLET | Freq: Four times a day (QID) | ORAL | Status: DC | PRN

## 2011-09-10 MED ORDER — SODIUM CHLORIDE 0.9 % IV SOLN
Freq: Once | INTRAVENOUS | Status: AC
  Administered 2011-09-10: 18:00:00 via INTRAVENOUS

## 2011-09-10 MED ORDER — OXYMETAZOLINE HCL 0.05 % NA SOLN
NASAL | Status: AC
  Filled 2011-09-10: qty 15

## 2011-09-10 MED ORDER — IOHEXOL 300 MG/ML  SOLN
100.0000 mL | Freq: Once | INTRAMUSCULAR | Status: AC | PRN
  Administered 2011-09-10: 100 mL via INTRAVENOUS

## 2011-09-10 MED ORDER — ONDANSETRON HCL 4 MG/2ML IJ SOLN
4.0000 mg | Freq: Once | INTRAMUSCULAR | Status: AC
  Administered 2011-09-10: 4 mg via INTRAVENOUS
  Filled 2011-09-10: qty 2

## 2011-09-10 MED ORDER — MORPHINE SULFATE 4 MG/ML IJ SOLN
4.0000 mg | Freq: Once | INTRAMUSCULAR | Status: AC
  Administered 2011-09-10: 4 mg via INTRAVENOUS
  Filled 2011-09-10: qty 1

## 2011-09-10 MED ORDER — ONDANSETRON HCL 4 MG/2ML IJ SOLN
4.0000 mg | Freq: Three times a day (TID) | INTRAMUSCULAR | Status: AC | PRN
  Filled 2011-09-10: qty 2

## 2011-09-10 MED ORDER — SODIUM CHLORIDE 0.9 % IV SOLN
INTRAVENOUS | Status: AC
  Administered 2011-09-10: 21:00:00 via INTRAVENOUS

## 2011-09-10 MED ORDER — HYDROMORPHONE HCL PF 1 MG/ML IJ SOLN
1.0000 mg | INTRAMUSCULAR | Status: AC | PRN
  Administered 2011-09-10 – 2011-09-11 (×2): 1 mg via INTRAVENOUS
  Filled 2011-09-10 (×3): qty 1

## 2011-09-10 MED ORDER — MORPHINE SULFATE 2 MG/ML IJ SOLN
1.0000 mg | INTRAMUSCULAR | Status: DC | PRN
  Administered 2011-09-11 (×2): 1 mg via INTRAVENOUS
  Filled 2011-09-10 (×3): qty 1

## 2011-09-10 MED ORDER — ONDANSETRON HCL 4 MG/2ML IJ SOLN
4.0000 mg | Freq: Four times a day (QID) | INTRAMUSCULAR | Status: DC | PRN
  Administered 2011-09-11 – 2011-09-12 (×4): 4 mg via INTRAVENOUS
  Filled 2011-09-10 (×3): qty 2

## 2011-09-10 MED ORDER — POTASSIUM CHLORIDE IN NACL 20-0.9 MEQ/L-% IV SOLN
INTRAVENOUS | Status: AC
  Administered 2011-09-10 – 2011-09-11 (×2): via INTRAVENOUS

## 2011-09-10 NOTE — H&P (Signed)
PCP:   DAVIDSON,ERIC, MD   Chief Complaint:  Abdominal pain  HPI: 61 year old Man with chronic abdominal pain comes in with worsening abdominal pain or last 24 hours associated with nausea and diarrhea. He is also noted some erythema that is developed and well healed midline abdominal scar that has been painful. His abdominal pain is mainly around this area. He denies any vomiting. He denies any blood in his stool. He has been passing gas. He denies any fevers. He denies any wounds or sores around this old midline incision on his abdomen recently. CAT scan of his abdomen and pelvis shows a questionable small bowel obstruction.  Review of Systems:  Otherwise Negative  Past Medical History: Past Medical History  Diagnosis Date  . Pulmonary embolism 02/2010  . Hypertension   . Osteoporosis   . Arthritis   . TIA (transient ischemic attack) 10/11  . ETOH abuse     quit 03/2010  . 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 Personal history of PE (pulmonary embolism) 10/01/2010  . Blood transfusion   . S/P endoscopy September 28, 2010    erosive reflux esophagitis, Billroth I anatomy  . Shortness of breath   . Sleep apnea   . Recurrent upper respiratory infection (URI)   . Anxiety   . Pneumonia   . Anemia   . Ileus   . Chronic abdominal pain   . GERD (gastroesophageal reflux disease)   . Chest pain at rest   . Erosive esophagitis   . 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 Hernia repair     right inguinal  . Appendectomy 1980s  . Cholecystectomy 1980s  . Colon surgery May 2012    left hemicolectomy, colon cancer found at time of surgery for bowel obstruction  . Portacath placement   . Abdominal sugery     for bowel obstruction x 8, all in 1980s, except for one in 07/2010  . Esophagogastroduodenoscopy 09/28/2010  . Esophagogastroduodenoscopy 12/01/2010    Cervical web status post dilation, erosive esophagitis, B1  hemigastrectomy, inflamed anastomosis  . Colonoscopy 03/18/2011    anastomosis at 35cm. Several adenomatous polyps removed. Sigmoid diverticulosis. Next TCS 02/2013  . Esophagogastroduodenoscopy 04/16/2011    excoriation at GEJ c/w trauma/M-W tear, friable gastric anastomosis, dilation efferent limb  . Billroth 1 hemigastrectomy 1980s    per patient for benign duodenal tumor    Medications: Prior to Admission medications   Medication Sig Start Date End Date Taking? Authorizing Provider  dexlansoprazole (DEXILANT) 60 MG capsule Take 1 capsule (60 mg total) by mouth daily. 03/22/11  Yes Tiffany Kocher, PA  folic acid (FOLVITE) 1 MG tablet Take 1 mg by mouth daily.     Yes Historical Provider, MD  LORazepam (ATIVAN) 1 MG tablet Take 1 mg by mouth 2 (two) times daily as needed. anxiety   Yes Historical Provider, MD  polyethylene glycol powder (GLYCOLAX/MIRALAX) powder Take 17 g by mouth as needed. For constipation   Yes Historical Provider, MD  prochlorperazine (COMPAZINE) 10 MG tablet Take 10 mg by mouth every 6 (six) hours as needed. Nausea and Vomiting   Yes Historical Provider, MD  sertraline (ZOLOFT) 100 MG tablet Take 100 mg by mouth at bedtime.   Yes Historical Provider, MD  thiamine 100 MG tablet Take 100 mg by mouth daily.     Yes Historical Provider, MD  traMADol (ULTRAM) 50 MG tablet Take 50 mg  by mouth every 6 (six) hours as needed. For pain   Yes Historical Provider, MD  valsartan-hydrochlorothiazide (DIOVAN-HCT) 80-12.5 MG per tablet Take 1 tablet by mouth daily.   Yes Historical Provider, MD  cyanocobalamin (,VITAMIN B-12,) 1000 MCG/ML injection Inject 1,000 mcg into the muscle every 30 (thirty) days.    Historical Provider, MD  sucralfate (CARAFATE) 1 GM/10ML suspension Take 10 mLs (1 g total) by mouth 4 (four) times daily. 12/01/10 12/31/10  Corbin Ade, MD    Allergies:  No Known Allergies  Social History:  reports that he has been smoking Cigarettes.  He has a 20  pack-year smoking history. He does not have any smokeless tobacco history on file. He reports that he does not drink alcohol or use illicit drugs.  Family History: Family History  Problem Relation Age of Onset  . Hypertension Mother   . Arthritis Mother   . Pneumonia Mother   . Hypertension Father   . Heart attack Father     Physical Exam: Filed Vitals:   09/10/11 1739 09/10/11 2024 09/10/11 2158 09/10/11 2243  BP: 142/84 133/93 134/80 160/90  Pulse:  94 92 94  Temp: 98.2 F (36.8 C) 99.1 F (37.3 C) 99.2 F (37.3 C) 98.2 F (36.8 C)  TempSrc: Oral Oral Oral Oral  Resp: 20 20 18 18   Height: 5\' 8"  (1.727 m)   5\' 8"  (1.727 m)  Weight: 63.957 kg (141 lb)   64.2 kg (141 lb 8.6 oz)  SpO2: 100% 99% 98% 97%   General appearance: alert, cooperative and no distress Neck: no adenopathy, no carotid bruit and no JVD Lungs: clear to auscultation bilaterally Heart: regular rate and rhythm, S1, S2 normal, no murmur, click, rub or gallop Abdomen: soft, non-tender; bowel sounds normal; no masses,  no organomegaly Extremities: extremities normal, atraumatic, no cyanosis or edema Pulses: 2+ and symmetric Skin: Skin color, texture, turgor normal. Ventral abd cellulitis around old well healed midline scar no absess/wound or drainage Neurologic: Grossly normal    Labs on Admission:   [facility] 09/10/11 1756  NA 138  K 4.0  CL 101  CO2 25  GLUCOSE 96  BUN 7  CREATININE 0.89  CALCIUM 9.2  MG --  PHOS --    Basename 09/10/11 1756  AST 13  ALT 10  ALKPHOS 149*  BILITOT 0.3  PROT 6.9  ALBUMIN 3.9    Basename 09/10/11 1756  LIPASE 21  AMYLASE --    Basename 09/10/11 1756  WBC 13.9*  NEUTROABS 10.6*  HGB 14.0  HCT 40.5  MCV 87.5  PLT 197    Radiological Exams on Admission: Ct Head Wo Contrast  09/02/2011  *RADIOLOGY REPORT*  Clinical Data: Colon cancer, altered mental status  CT HEAD WITHOUT CONTRAST  Technique:  Contiguous axial images were obtained from the base of  the skull through the vertex without contrast.  Comparison: 04/14/2010, 04/13/2010  Findings: Stable atrophy.  Slight progression of patchy subcortical and periventricular white matter chronic microvascular ischemic changes.  No definite focal edema or sulcal effacement. Symmetric ventricles.  No hydrocephalus, herniation, or extra-axial fluid collection.  Cisterns patent.  No cerebellar abnormality. Symmetric orbits.  No skull abnormality.  Sinuses and mastoids clear.  IMPRESSION: Atrophy and microvascular ischemic changes throughout the white matter, slightly progressed since 04/14/2010.  No acute intracranial hemorrhage or definite acute infarction.  Original Report Authenticated By: Judie Petit. Ruel Favors, M.D.   Ct Abdomen Pelvis W Contrast  09/10/2011  *RADIOLOGY REPORT*  Clinical Data: Periumbilical  abdominal pain which began earlier today.  History of colon cancer with multiple surgeries.  CT ABDOMEN AND PELVIS WITH CONTRAST  Technique:  Multidetector CT imaging of the abdomen and pelvis was performed following the standard protocol during bolus administration of intravenous contrast.  Contrast: OMNIPAQUE IOHEXOL 300 MG/ML. Oral contrast also administered.  Comparison: CT abdomen and pelvis 05/31/2011, 05/13/2011, 04/16/2011, 12/01/2010.  Findings: Since the 05/2011 examination, interval significant increase in caliber of numerous loops of small bowel throughout the abdomen, with relatively decompressed distal small bowel.  The transition appears to be at the level of the distal jejunum or proximal ileum just to the left of midline at the level of the umbilicus.  No evidence of free intraperitoneal air.  No ascites. Moderate stool throughout the colon which is otherwise unremarkable.  Stable small hiatal hernia.  Stomach otherwise unremarkable.  Stable sub-centimeter cyst in the medial segment left lobe of liver; no significant focal hepatic parenchymal abnormalities. Normal appearing spleen, pancreas, and  adrenal glands.  Gallbladder surgically absent.  No unexpected biliary ductal dilation. Extrarenal pelves involving both kidneys.  Cortical cysts involving the right kidney, the largest approximating 3.3 cm in diameter arising from the anterior mid kidney; no significant abnormalities involving either kidney.  Moderate abdominal aortic atherosclerosis without aneurysm.  Scattered normal-sized retroperitoneal lymph nodes; no significant lymphadenopathy.  Moderate prostate gland enlargement, particularly the median lobe. Normal seminal vesicles.  Normal appearing urinary bladder. Visualized lung bases clear.  Heart size upper normal.  Bone window images demonstrate osteopenia.  IMPRESSION:  1.  Small bowel obstruction, with the transition apparently involving the distal jejunum or proximal ileum in the left upper pelvis at the level of the umbilicus, likely an adhesion.  No free intraperitoneal air or ascites. 2.  Moderate prostate gland enlargement, particularly the median lobe.  Original Report Authenticated By: Arnell Sieving, M.D.    Assessment/Plan Present on Admission:  61 year old man with acute on chronic abdominal pain with cellulitis of abdominal wall and questionable partial small bowel obstruction  .Partial small bowel obstruction .Chronic abdominal pain .Nausea .Adenocarcinoma of colon with mucinous features .Diarrhea .Cellulitis  General surgery has already been called and will see the patient tomorrow. Not clear whether he is worsening abdominal pain is from abdominal area with cellulitis that has developed. The area of erythema has been marked out with a pen and we'll place the patient on vancomycin. N.p.o. and IV fluids until seen by surgery. Patient has an normal abdominal exam otherwise.  Joah Patlan [phone] 09/10/2011, 10:57 PM

## 2011-09-10 NOTE — ED Notes (Signed)
Attempted to call report Abby will call back.

## 2011-09-10 NOTE — ED Notes (Signed)
Pt asking who ordered ng tube, states "the last time they put that tube in, when I got upstairs, the admitting Dr. Catalina Pizza them to take it out." Discussed this with Dr. Manus Gunning and he advised that he ordered it as protocol but pt may refuse. Pt refused and Dr.aware.

## 2011-09-10 NOTE — ED Notes (Signed)
abd pain, nausea , diarrhea, no fever.  Onset today.

## 2011-09-10 NOTE — ED Provider Notes (Signed)
History   This chart was scribed for Glynn Octave, MD by Melba Coon. The patient was seen in room APA03/APA03 and the patient's care was started at 5:50PM.    CSN: [id number]  Arrival date & time 09/10/11  1737   First MD Initiated Contact with Patient 09/10/11 1745      Chief Complaint  Patient presents with  . Abdominal Pain    (Consider location/radiation/quality/duration/timing/severity/associated sxs/prior treatment) HPI [name] is a 61 y.o. male who presents to the Emergency Department complaining of constant, moderate to severe abdominal pain with associated nausea and diarrhea with an onset today. Pt has had similar pain in the past when he had abd obstructions; last surgery May 2012. Diarrhea x2 today; no hematochezia; nml BMs before today. No HA, fever, neck pain, sore throat, rash, back pain, CP, SOB, emesis, dysuria, or extremity pain, edema, weakness, numbness, or tingling. Hx of colon CA and HTN; Hx of appendectomy and cholecystectomy. No known allergies. No other pertinent medical symptoms.  PCP: Dr. Ignacia Palma  Past Medical History  Diagnosis Date  . Pulmonary embolism 02/2010  . Hypertension   . Osteoporosis   . Arthritis   . TIA (transient ischemic attack) 10/11  . ETOH abuse     quit 03/2010  . 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 Personal history of PE (pulmonary embolism) 10/01/2010  . Blood transfusion   . S/P endoscopy September 28, 2010    erosive reflux esophagitis, Billroth I anatomy  . Shortness of breath   . Sleep apnea   . Recurrent upper respiratory infection (URI)   . Anxiety   . Pneumonia   . Anemia   . Ileus   . Chronic abdominal pain   . GERD (gastroesophageal reflux disease)   . Chest pain at rest   . Erosive esophagitis   . 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 Hernia repair     right inguinal  . Appendectomy 1980s  . Cholecystectomy 1980s  . Colon  surgery May 2012    left hemicolectomy, colon cancer found at time of surgery for bowel obstruction  . Portacath placement   . Abdominal sugery     for bowel obstruction x 8, all in 1980s, except for one in 07/2010  . Esophagogastroduodenoscopy 09/28/2010  . Esophagogastroduodenoscopy 12/01/2010    Cervical web status post dilation, erosive esophagitis, B1 hemigastrectomy, inflamed anastomosis  . Colonoscopy 03/18/2011    anastomosis at 35cm. Several adenomatous polyps removed. Sigmoid diverticulosis. Next TCS 02/2013  . Esophagogastroduodenoscopy 04/16/2011    excoriation at GEJ c/w trauma/M-W tear, friable gastric anastomosis, dilation efferent limb  . 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 Hypertension Mother   . Arthritis Mother   . Pneumonia Mother   . Hypertension Father   . Heart attack Father   . Colon cancer Neg Hx     History  Substance Use Topics  . Smoking status: Current Everyday Smoker -- 0.5 packs/day for 40 years    Types: Cigarettes  . Smokeless tobacco: Not on file  . Alcohol Use: No     last drink was Apr 16 2010      Review of Systems 10 Systems reviewed and all are negative for acute change except as noted in the HPI.   Allergies  Review of  patient's allergies indicates no known allergies.  Home Medications   Current Outpatient Rx  Name Route Sig Dispense Refill  . DEXLANSOPRAZOLE 60 MG PO CPDR Oral Take 1 capsule (60 mg total) by mouth daily. 30 capsule 11  . FOLIC ACID 1 MG PO TABS Oral Take 1 mg by mouth daily.      [name] LORAZEPAM 1 MG PO TABS Oral Take 1 mg by mouth 2 (two) times daily as needed. anxiety    . POLYETHYLENE GLYCOL 3350 PO POWD Oral Take 17 g by mouth as needed. For constipation    . PROCHLORPERAZINE MALEATE 10 MG PO TABS Oral Take 10 mg by mouth every 6 (six) hours as needed. Nausea and Vomiting    . SERTRALINE HCL 100 MG PO TABS Oral Take 100 mg  by mouth at bedtime.    . THIAMINE HCL 100 MG PO TABS Oral Take 100 mg by mouth daily.      . TRAMADOL HCL 50 MG PO TABS Oral Take 50 mg by mouth every 6 (six) hours as needed. For pain    . VALSARTAN-HYDROCHLOROTHIAZIDE 80-12.5 MG PO TABS Oral Take 1 tablet by mouth daily.    . CYANOCOBALAMIN 1000 MCG/ML IJ SOLN Intramuscular Inject 1,000 mcg into the muscle every 30 (thirty) days.    . SUCRALFATE 1 GM/10ML PO SUSP Oral Take 10 mLs (1 g total) by mouth 4 (four) times daily. 420 mL 2    BP 133/93  Pulse 94  Temp 99.1 F (37.3 C) (Oral)  Resp 20  Ht 5\' 8"  (1.727 m)  Wt 141 lb (63.957 kg)  BMI 21.44 kg/m2  SpO2 99%  Physical Exam  Nursing note and vitals reviewed. Constitutional: He is oriented to person, place, and time. He appears well-developed and well-nourished. No distress.  HENT:  Head: Normocephalic and atraumatic.  Right Ear: External ear normal.  Left Ear: External ear normal.  Eyes: EOM are normal.  Neck: Normal range of motion. Neck supple. No tracheal deviation present.  Cardiovascular: Normal rate, regular rhythm and normal heart sounds.   No murmur heard. Pulmonary/Chest: Effort normal and breath sounds normal. No respiratory distress. He has no wheezes.  Abdominal: Soft. He exhibits no distension and no mass. There is tenderness (paraumblical). There is no rebound and no guarding.  Musculoskeletal: Normal range of motion. He exhibits no edema and no tenderness (No CVA tenderness).  Neurological: He is alert and oriented to person, place, and time.  Skin: Skin is warm and dry.       Well-healed midline incision; 6 cm area of erythema centrally; 1 cm central area of induration w/o fluctuance.  Psychiatric: He has a normal mood and affect. His behavior is normal.    ED Course  Procedures (including critical care time)  DIAGNOSTIC STUDIES: Oxygen Saturation is 100% on room air, normal by my interpretation.    COORDINATION OF CARE:  5:56PM - EDMD will order IV  fluids, Zofran, morphine, abd CT, blood w/u, and UA for the pt.   Labs Reviewed  CBC - Abnormal; Notable for the following:    WBC 13.9 (*)     All other components within normal limits  DIFFERENTIAL - Abnormal; Notable for the following:    Neutro Abs 10.6 (*)     Monocytes Absolute 1.1 (*)     All other components within normal limits  COMPREHENSIVE METABOLIC PANEL - Abnormal; Notable for the following:    Alkaline Phosphatase 149 (*)     All other  components within normal limits  LIPASE, BLOOD  LACTIC ACID, PLASMA  URINALYSIS, ROUTINE W REFLEX MICROSCOPIC   Ct Abdomen Pelvis W Contrast  09/10/2011  *RADIOLOGY REPORT*  Clinical Data: Periumbilical abdominal pain which began earlier today.  History of colon cancer with multiple surgeries.  CT ABDOMEN AND PELVIS WITH CONTRAST  Technique:  Multidetector CT imaging of the abdomen and pelvis was performed following the standard protocol during bolus administration of intravenous contrast.  Contrast: OMNIPAQUE IOHEXOL 300 MG/ML. Oral contrast also administered.  Comparison: CT abdomen and pelvis 05/31/2011, 05/13/2011, 04/16/2011, 12/01/2010.  Findings: Since the 05/2011 examination, interval significant increase in caliber of numerous loops of small bowel throughout the abdomen, with relatively decompressed distal small bowel.  The transition appears to be at the level of the distal jejunum or proximal ileum just to the left of midline at the level of the umbilicus.  No evidence of free intraperitoneal air.  No ascites. Moderate stool throughout the colon which is otherwise unremarkable.  Stable small hiatal hernia.  Stomach otherwise unremarkable.  Stable sub-centimeter cyst in the medial segment left lobe of liver; no significant focal hepatic parenchymal abnormalities. Normal appearing spleen, pancreas, and adrenal glands.  Gallbladder surgically absent.  No unexpected biliary ductal dilation. Extrarenal pelves involving both kidneys.   Cortical cysts involving the right kidney, the largest approximating 3.3 cm in diameter arising from the anterior mid kidney; no significant abnormalities involving either kidney.  Moderate abdominal aortic atherosclerosis without aneurysm.  Scattered normal-sized retroperitoneal lymph nodes; no significant lymphadenopathy.  Moderate prostate gland enlargement, particularly the median lobe. Normal seminal vesicles.  Normal appearing urinary bladder. Visualized lung bases clear.  Heart size upper normal.  Bone window images demonstrate osteopenia.  IMPRESSION:  1.  Small bowel obstruction, with the transition apparently involving the distal jejunum or proximal ileum in the left upper pelvis at the level of the umbilicus, likely an adhesion.  No free intraperitoneal air or ascites. 2.  Moderate prostate gland enlargement, particularly the median lobe.  Original Report Authenticated By: Arnell Sieving, M.D.     No diagnosis found.    MDM  History of bowel structures present with abdominal pain, nausea, diarrhea for the past day. Also area of cellulitis around umbilicus a previous scar. No operations since May 2012. Afebrile  Leukocytosis noted. Patient does have cellulitis of abdominal wall. CT scan shows small bowel structure with transition point the distal jejunum. Patient has past gas today and 2 loose bowel movements. He is not vomiting. Case discussed with Dr. Lovell Sheehan in general surgery. He agrees to consult on patient will admit to medicine given that patient is not a surgical candidate at this time.  I personally performed the services described in this documentation, which was scribed in my presence.  The recorded information has been reviewed and considered.       Glynn Octave, MD 09/12/11 [phone]

## 2011-09-10 NOTE — ED Notes (Signed)
Pt c/o abd pain with n/d since earlier today. Pt states he has a hx of colon cancer and is worried he has an infection.

## 2011-09-11 DIAGNOSIS — R109 Unspecified abdominal pain: Secondary | ICD-10-CM

## 2011-09-11 DIAGNOSIS — K56609 Unspecified intestinal obstruction, unspecified as to partial versus complete obstruction: Secondary | ICD-10-CM

## 2011-09-11 DIAGNOSIS — L0291 Cutaneous abscess, unspecified: Secondary | ICD-10-CM

## 2011-09-11 DIAGNOSIS — L0390 Cellulitis, unspecified: Secondary | ICD-10-CM

## 2011-09-11 LAB — BASIC METABOLIC PANEL
GFR calc Af Amer: >90 mL/min (ref >90)
GFR calc non Af Amer: 90 mL/min — ABNORMAL LOW (ref >90)
Potassium: 4.4 mEq/L (ref 3.5–5.1)
Sodium: 140 mEq/L (ref 135–145)

## 2011-09-11 LAB — CBC
HCT: 38.6 % — ABNORMAL LOW (ref 39.0–52.0)
Hemoglobin: 13 g/dL (ref 13.0–17.0)
MCH: 30 pg (ref 26.0–34.0)
MCHC: 33.7 g/dL (ref 30.0–36.0)
MCV: 88.9 fL (ref 78.0–100.0)
Platelets: 180 10*3/uL (ref 150–400)
RBC: 4.34 MIL/uL (ref 4.22–5.81)
RDW: 14.4 % (ref 11.5–15.5)
WBC: 11.8 10*3/uL — ABNORMAL HIGH (ref 4.0–10.5)

## 2011-09-11 MED ORDER — MORPHINE SULFATE 2 MG/ML IJ SOLN
1.0000 mg | INTRAMUSCULAR | Status: DC | PRN
  Administered 2011-09-11 – 2011-09-12 (×7): 1 mg via INTRAVENOUS
  Filled 2011-09-11 (×7): qty 1

## 2011-09-11 MED ORDER — VANCOMYCIN HCL IN DEXTROSE 1-5 GM/200ML-% IV SOLN
1000.0000 mg | Freq: Two times a day (BID) | INTRAVENOUS | Status: DC
  Administered 2011-09-11 (×2): 1000 mg via INTRAVENOUS
  Filled 2011-09-11 (×5): qty 200

## 2011-09-11 NOTE — Progress Notes (Signed)
Subjective: This man was admitted yesterday again with symptoms of abdominal pain. CT scan of the abdomen is reported as showing small bowel obstruction. He denies any vomiting. He has passed flatus yesterday. Dr. Franky Macho, surgery has already seen him and is recommending conservative treatment. I agree with this.           Physical Exam: Blood pressure 118/71, pulse 80, temperature 98.5 F (36.9 C), temperature source Oral, resp. rate 20, height 5\' 8"  (1.727 m), weight 64.2 kg (141 lb 8.6 oz), SpO2 97.00%. Looks systemically well, not  septic or toxic. Abdomen is soft and mildly tender. There is clear cellulitis which has been demarcated with a pen mark and this is being. Compared to yesterday evening  it has not progressed. Lung fields are clear. Heart sounds are present and normal. He is alert and orientated.   Investigations:     Basic Metabolic Panel:  Basename 09/11/11 0630 09/10/11 1756  NA 140 138  K 4.4 4.0  CL 104 101  CO2 27 25  GLUCOSE 92 96  BUN 7 7  CREATININE 0.92 0.89  CALCIUM 8.8 9.2  MG -- --  PHOS -- --   Liver Function Tests:  Basename 09/10/11 1756  AST 13  ALT 10  ALKPHOS 149*  BILITOT 0.3  PROT 6.9  ALBUMIN 3.9     CBC:  Basename 09/11/11 0630 09/10/11 1756  WBC 11.8* 13.9*  NEUTROABS -- 10.6*  HGB 13.0 14.0  HCT 38.6* 40.5  MCV 88.9 87.5  PLT 180 197    Ct Abdomen Pelvis W Contrast  09/10/2011  *RADIOLOGY REPORT*  Clinical Data: Periumbilical abdominal pain which began earlier today.  History of colon cancer with multiple surgeries.  CT ABDOMEN AND PELVIS WITH CONTRAST  Technique:  Multidetector CT imaging of the abdomen and pelvis was performed following the standard protocol during bolus administration of intravenous contrast.  Contrast: OMNIPAQUE IOHEXOL 300 MG/ML. Oral contrast also administered.  Comparison: CT abdomen and pelvis 05/31/2011, 05/13/2011, 04/16/2011, 12/01/2010.  Findings: Since the 05/2011 examination,  interval significant increase in caliber of numerous loops of small bowel throughout the abdomen, with relatively decompressed distal small bowel.  The transition appears to be at the level of the distal jejunum or proximal ileum just to the left of midline at the level of the umbilicus.  No evidence of free intraperitoneal air.  No ascites. Moderate stool throughout the colon which is otherwise unremarkable.  Stable small hiatal hernia.  Stomach otherwise unremarkable.  Stable sub-centimeter cyst in the medial segment left lobe of liver; no significant focal hepatic parenchymal abnormalities. Normal appearing spleen, pancreas, and adrenal glands.  Gallbladder surgically absent.  No unexpected biliary ductal dilation. Extrarenal pelves involving both kidneys.  Cortical cysts involving the right kidney, the largest approximating 3.3 cm in diameter arising from the anterior mid kidney; no significant abnormalities involving either kidney.  Moderate abdominal aortic atherosclerosis without aneurysm.  Scattered normal-sized retroperitoneal lymph nodes; no significant lymphadenopathy.  Moderate prostate gland enlargement, particularly the median lobe. Normal seminal vesicles.  Normal appearing urinary bladder. Visualized lung bases clear.  Heart size upper normal.  Bone window images demonstrate osteopenia.  IMPRESSION:  1.  Small bowel obstruction, with the transition apparently involving the distal jejunum or proximal ileum in the left upper pelvis at the level of the umbilicus, likely an adhesion.  No free intraperitoneal air or ascites. 2.  Moderate prostate gland enlargement, particularly the median lobe.  Original Report Authenticated By: Marya Landry  Lyman Bishop, M.D.      Medications:  Scheduled:   . sodium chloride   Intravenous Once  . sodium chloride   Intravenous STAT  . lidocaine      .  morphine injection  4 mg Intravenous Once  .  morphine injection  4 mg Intravenous Once  . ondansetron  4 mg  Intravenous Once  . oxymetazoline      . vancomycin  1,000 mg Intravenous Once  . vancomycin  1,000 mg Intravenous Q12H    Impression: 1. Possible small bowel obstruction although clinically seems to be resolving. 2. History of colon cancer, status post surgery. 3. History of chronic abdominal pain with suspicion for drug-seeking behavior. 4. Cellulitis around the umbilicus.     Plan: 1. Continue with intravenous vancomycin. 2. Agree with advancing diet. 3. Reduce the use of intravenous opioids.     LOS: 1 day   [name] Singer Pager [phone]  09/11/2011, 9:35 AM

## 2011-09-11 NOTE — Consult Note (Signed)
ANTIBIOTIC CONSULT NOTE - INITIAL  Pharmacy Consult for Vancomycin Indication: cellulitis (abdominal wall)  No Known Allergies  Patient Measurements: Height: 5\' 8"  (172.7 cm) Weight: 141 lb 8.6 oz (64.2 kg) IBW/kg (Calculated) : 68.4   Vital Signs: Temp: 98.5 F (36.9 C) (06/22 0523) Temp src: Oral (06/22 0523) BP: 118/71 mmHg (06/22 0523) Pulse Rate: 80  (06/22 0523) Intake/Output from previous day:   Intake/Output from this shift:    Labs:  Basename 09/11/11 0630 09/10/11 1756  WBC 11.8* 13.9*  HGB 13.0 14.0  PLT 180 197  LABCREA -- --  CREATININE 0.92 0.89   Estimated Creatinine Clearance: 77.5 ml/min (by C-G formula based on Cr of 0.92). No results found for this basename: VANCOTROUGH:2,VANCOPEAK:2,VANCORANDOM:2,GENTTROUGH:2,GENTPEAK:2,GENTRANDOM:2,TOBRATROUGH:2,TOBRAPEAK:2,TOBRARND:2,AMIKACINPEAK:2,AMIKACINTROU:2,AMIKACIN:2, in the last 72 hours   Microbiology: No results found for this or any previous visit (from the past 720 hour(s)).  Medical History: Past Medical History  Diagnosis Date  . Pulmonary embolism 02/2010  . Hypertension   . Osteoporosis   . Arthritis   . TIA (transient ischemic attack) 10/11  . ETOH abuse     quit 03/2010  . 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 Personal history of PE (pulmonary embolism) 10/01/2010  . Blood transfusion   . S/P endoscopy September 28, 2010    erosive reflux esophagitis, Billroth I anatomy  . Shortness of breath   . Sleep apnea   . Recurrent upper respiratory infection (URI)   . Anxiety   . Pneumonia   . Anemia   . Ileus   . Chronic abdominal pain   . GERD (gastroesophageal reflux disease)   . Chest pain at rest   . Erosive esophagitis   . 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 sodium chloride   Intravenous Once  . sodium chloride   Intravenous STAT  . lidocaine      .  morphine injection  4 mg Intravenous Once  .  morphine injection  4 mg  Intravenous Once  . ondansetron  4 mg Intravenous Once  . oxymetazoline      . vancomycin  1,000 mg Intravenous Once  . vancomycin  1,000 mg Intravenous Q12H   Assessment: 61yo Male with abdominal wall cellulitis, 64Kg Good renal fxn  Goal of Therapy:  Vancomycin trough level 10-15 mcg/ml  Plan: Vancomycin 1gm iv q12hrs Check trough at steady state Labs per protocol, f/u micro data as available  [name] A 09/11/2011,8:07 AM

## 2011-09-11 NOTE — Progress Notes (Signed)
Dr. Onalee Hua made aware of pts heart rhythm sounding like it skips a beat occasionally. No orders received. [name]

## 2011-09-11 NOTE — Consult Note (Signed)
Reason for Consult: Small bowel obstruction by CAT scan Referring Physician: [facility]  [name] is an 61 y.o. male.  HPI: Patient is a 61 year old white male with extensive history of abdominal surgery, last operated on by Dr. 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 He was last seen by Dr. Mariel Sleet of oncology 3 months ago. He presented emergency room with abdominal pain. He states he might have some mild nausea, though he had no emesis. He has been passing flatus and having normal bowel movements. He states that he is hungry.  Past Medical History  Diagnosis Date  . Pulmonary embolism 02/2010  . Hypertension   . Osteoporosis   . Arthritis   . TIA (transient ischemic attack) 10/11  . ETOH abuse     quit 03/2010  . 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 Personal history of PE (pulmonary embolism) 10/01/2010  . Blood transfusion   . S/P endoscopy September 28, 2010    erosive reflux esophagitis, Billroth I anatomy  . Shortness of breath   . Sleep apnea   . Recurrent upper respiratory infection (URI)   . Anxiety   . Pneumonia   . Anemia   . Ileus   . Chronic abdominal pain   . GERD (gastroesophageal reflux disease)   . Chest pain at rest   . Erosive esophagitis   . 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 Hernia repair     right inguinal  . Appendectomy 1980s  . Cholecystectomy 1980s  . Colon surgery May 2012    left hemicolectomy, colon cancer found at time of surgery for bowel obstruction  . Portacath placement   . Abdominal sugery     for bowel obstruction x 8, all in 1980s, except for one in 07/2010  . Esophagogastroduodenoscopy 09/28/2010  . Esophagogastroduodenoscopy 12/01/2010    Cervical web status post dilation, erosive esophagitis, B1 hemigastrectomy, inflamed anastomosis  . Colonoscopy 03/18/2011    anastomosis at 35cm. Several  adenomatous polyps removed. Sigmoid diverticulosis. Next TCS 02/2013  . Esophagogastroduodenoscopy 04/16/2011    excoriation at [facility] c/w trauma/M-W tear, friable gastric anastomosis, dilation efferent limb  . 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 Hypertension Mother   . Arthritis Mother   . Pneumonia Mother   . Hypertension Father   . Heart attack Father     Social History:  reports that he has been smoking Cigarettes.  He has a 20 pack-year smoking history. He does not have any smokeless tobacco history on file. He reports that he does not drink alcohol or use illicit drugs.  Allergies: No Known Allergies  Medications: I have reviewed the patient's current medications.  Results for orders placed during the hospital encounter of 09/10/11 (from the past 48 hour(s))  CBC     Status: Abnormal   Collection Time   09/10/11  5:56 PM      Component Value Range Comment   WBC 13.9 (*) 4.0 - 10.5 K/uL    RBC 4.63  4.22 - 5.81 MIL/uL    Hemoglobin 14.0  13.0 - 17.0 g/dL    HCT 40.9  81.1 - 91.4 %    MCV 87.5  78.0 - 100.0 fL    MCH 30.2  26.0 -  34.0 pg    MCHC 34.6  30.0 - 36.0 g/dL    RDW 16.1  09.6 - 04.5 %    Platelets 197  150 - 400 K/uL   DIFFERENTIAL     Status: Abnormal   Collection Time   09/10/11  5:56 PM      Component Value Range Comment   Neutrophils Relative 76  43 - 77 %    Neutro Abs 10.6 (*) 1.7 - 7.7 K/uL    Lymphocytes Relative 15  12 - 46 %    Lymphs Abs 2.1  0.7 - 4.0 K/uL    Monocytes Relative 8  3 - 12 %    Monocytes Absolute 1.1 (*) 0.1 - 1.0 K/uL    Eosinophils Relative 0  0 - 5 %    Eosinophils Absolute 0.0  0.0 - 0.7 K/uL    Basophils Relative 0  0 - 1 %    Basophils Absolute 0.0  0.0 - 0.1 K/uL   COMPREHENSIVE METABOLIC PANEL     Status: Abnormal   Collection Time   09/10/11  5:56 PM      Component Value Range Comment   Sodium 138  135 - 145 mEq/L    Potassium 4.0   3.5 - 5.1 mEq/L    Chloride 101  96 - 112 mEq/L    CO2 25  19 - 32 mEq/L    Glucose, Bld 96  70 - 99 mg/dL    BUN 7  6 - 23 mg/dL    Creatinine, Ser 4.09  0.50 - 1.35 mg/dL    Calcium 9.2  8.4 - 81.1 mg/dL    Total Protein 6.9  6.0 - 8.3 g/dL    Albumin 3.9  3.5 - 5.2 g/dL    AST 13  0 - 37 U/L    ALT 10  0 - 53 U/L    Alkaline Phosphatase 149 (*) 39 - 117 U/L    Total Bilirubin 0.3  0.3 - 1.2 mg/dL    GFR calc non Af Amer >90  >90 mL/min    GFR calc Af Amer >90  >90 mL/min   LIPASE, BLOOD     Status: Normal   Collection Time   09/10/11  5:56 PM      Component Value Range Comment   Lipase 21  11 - 59 U/L   LACTIC ACID, PLASMA     Status: Normal   Collection Time   09/10/11  5:56 PM      Component Value Range Comment   Lactic Acid, Venous 1.6  0.5 - 2.2 mmol/L   URINALYSIS, ROUTINE W REFLEX MICROSCOPIC     Status: Normal   Collection Time   09/10/11  5:58 PM      Component Value Range Comment   Color, Urine YELLOW  YELLOW    APPearance CLEAR  CLEAR    Specific Gravity, Urine 1.020  1.005 - 1.030    pH 5.5  5.0 - 8.0    Glucose, UA NEGATIVE  NEGATIVE mg/dL    Hgb urine dipstick NEGATIVE  NEGATIVE    Bilirubin Urine NEGATIVE  NEGATIVE    Ketones, ur NEGATIVE  NEGATIVE mg/dL    Protein, ur NEGATIVE  NEGATIVE mg/dL    Urobilinogen, UA 0.2  0.0 - 1.0 mg/dL    Nitrite NEGATIVE  NEGATIVE    Leukocytes, UA NEGATIVE  NEGATIVE MICROSCOPIC NOT DONE ON URINES WITH NEGATIVE PROTEIN, BLOOD, LEUKOCYTES, NITRITE, OR GLUCOSE <1000 mg/dL.  BASIC METABOLIC  PANEL     Status: Abnormal   Collection Time   09/11/11  6:30 AM      Component Value Range Comment   Sodium 140  135 - 145 mEq/L    Potassium 4.4  3.5 - 5.1 mEq/L    Chloride 104  96 - 112 mEq/L    CO2 27  19 - 32 mEq/L    Glucose, Bld 92  70 - 99 mg/dL    BUN 7  6 - 23 mg/dL    Creatinine, Ser 1.91  0.50 - 1.35 mg/dL    Calcium 8.8  8.4 - 47.8 mg/dL    GFR calc non Af Amer 90 (*) >90 mL/min    GFR calc Af Amer >90  >90 mL/min   CBC      Status: Abnormal   Collection Time   09/11/11  6:30 AM      Component Value Range Comment   WBC 11.8 (*) 4.0 - 10.5 K/uL    RBC 4.34  4.22 - 5.81 MIL/uL    Hemoglobin 13.0  13.0 - 17.0 g/dL    HCT 29.5 (*) 62.1 - 52.0 %    MCV 88.9  78.0 - 100.0 fL    MCH 30.0  26.0 - 34.0 pg    MCHC 33.7  30.0 - 36.0 g/dL    RDW 30.8  65.7 - 84.6 %    Platelets 180  150 - 400 K/uL     Ct Abdomen Pelvis W Contrast  09/10/2011  *RADIOLOGY REPORT*  Clinical Data: Periumbilical abdominal pain which began earlier today.  History of colon cancer with multiple surgeries.  CT ABDOMEN AND PELVIS WITH CONTRAST  Technique:  Multidetector CT imaging of the abdomen and pelvis was performed following the standard protocol during bolus administration of intravenous contrast.  Contrast: OMNIPAQUE IOHEXOL 300 MG/ML. Oral contrast also administered.  Comparison: CT abdomen and pelvis 05/31/2011, 05/13/2011, 04/16/2011, 12/01/2010.  Findings: Since the 05/2011 examination, interval significant increase in caliber of numerous loops of small bowel throughout the abdomen, with relatively decompressed distal small bowel.  The transition appears to be at the level of the distal jejunum or proximal ileum just to the left of midline at the level of the umbilicus.  No evidence of free intraperitoneal air.  No ascites. Moderate stool throughout the colon which is otherwise unremarkable.  Stable small hiatal hernia.  Stomach otherwise unremarkable.  Stable sub-centimeter cyst in the medial segment left lobe of liver; no significant focal hepatic parenchymal abnormalities. Normal appearing spleen, pancreas, and adrenal glands.  Gallbladder surgically absent.  No unexpected biliary ductal dilation. Extrarenal pelves involving both kidneys.  Cortical cysts involving the right kidney, the largest approximating 3.3 cm in diameter arising from the anterior mid kidney; no significant abnormalities involving either kidney.  Moderate abdominal  aortic atherosclerosis without aneurysm.  Scattered normal-sized retroperitoneal lymph nodes; no significant lymphadenopathy.  Moderate prostate gland enlargement, particularly the median lobe. Normal seminal vesicles.  Normal appearing urinary bladder. Visualized lung bases clear.  Heart size upper normal.  Bone window images demonstrate osteopenia.  IMPRESSION:  1.  Small bowel obstruction, with the transition apparently involving the distal jejunum or proximal ileum in the left upper pelvis at the level of the umbilicus, likely an adhesion.  No free intraperitoneal air or ascites. 2.  Moderate prostate gland enlargement, particularly the median lobe.  Original Report Authenticated By: Arnell Sieving, M.D.    ROS: See chart Blood pressure 118/71, pulse 80, temperature 98.5  F (36.9 C), temperature source Oral, resp. rate 20, height 5\' 8"  (1.727 m), weight 64.2 kg (141 lb 8.6 oz), SpO2 97.00%. Physical Exam: Pleasant white male in no acute distress.  Abdomen is soft, nontender, nondistended. He does have some edema around the mid umbilical area of the surgical scar. The etiology of this is unknown. He does have bowel sounds. No hepatosplenomegaly, masses, or hernias are appreciated.  Assessment/Plan: Impression: even though the CAT scan shows a transition point in the distal small bowel, he clinically does not have a bowel obstruction at this point. The abdominal wall cellulitis is of unknown etiology. Agree with vancomycin. Would go ahead and advance to a full liquid diet. No acute surgical intervention is warranted at this time. This was discussed with Dr. Karilyn Cota. Will follow with you.  Venda Dice A 09/11/2011, 9:36 AM

## 2011-09-12 DIAGNOSIS — L0390 Cellulitis, unspecified: Secondary | ICD-10-CM

## 2011-09-12 DIAGNOSIS — R109 Unspecified abdominal pain: Secondary | ICD-10-CM

## 2011-09-12 DIAGNOSIS — K56609 Unspecified intestinal obstruction, unspecified as to partial versus complete obstruction: Secondary | ICD-10-CM

## 2011-09-12 DIAGNOSIS — L0291 Cutaneous abscess, unspecified: Secondary | ICD-10-CM

## 2011-09-12 LAB — COMPREHENSIVE METABOLIC PANEL
AST: 11 U/L (ref 0–37)
BUN: 5 mg/dL — ABNORMAL LOW (ref 6–23)
CO2: 25 mEq/L (ref 19–32)
Chloride: 103 mEq/L (ref 96–112)
Creatinine, Ser: 0.74 mg/dL (ref 0.50–1.35)
GFR calc non Af Amer: >90 mL/min (ref >90)
Total Bilirubin: 0.4 mg/dL (ref 0.3–1.2)

## 2011-09-12 LAB — CBC
HCT: 39.3 % (ref 39.0–52.0)
MCV: 89.3 fL (ref 78.0–100.0)
RBC: 4.4 MIL/uL (ref 4.22–5.81)
WBC: 12.5 10*3/uL — ABNORMAL HIGH (ref 4.0–10.5)

## 2011-09-12 MED ORDER — SODIUM CHLORIDE 0.9 % IJ SOLN
INTRAMUSCULAR | Status: AC
  Filled 2011-09-12: qty 3

## 2011-09-12 MED ORDER — HEPARIN SOD (PORK) LOCK FLUSH 100 UNIT/ML IV SOLN
500.0000 [IU] | Freq: Once | INTRAVENOUS | Status: AC
  Administered 2011-09-12: 500 [IU] via INTRAVENOUS
  Filled 2011-09-12: qty 5

## 2011-09-12 MED ORDER — LEVOFLOXACIN 750 MG PO TABS: 750.0000 mg | ORAL_TABLET | Freq: Every day | ORAL | Status: AC

## 2011-09-12 MED ORDER — SODIUM CHLORIDE 0.9 % IJ SOLN
INTRAMUSCULAR | Status: AC
  Administered 2011-09-12: 10 mL
  Filled 2011-09-12: qty 3

## 2011-09-12 MED ORDER — LEVOFLOXACIN 500 MG PO TABS
750.0000 mg | ORAL_TABLET | Freq: Every day | ORAL | Status: DC
  Administered 2011-09-12: 750 mg via ORAL
  Filled 2011-09-12: qty 1

## 2011-09-12 NOTE — Progress Notes (Signed)
Subjective: This man was admitted again with symptoms of abdominal pain. CT scan of the abdomen is reported as showing small bowel obstruction. He denies any vomiting. He has passed flatus yesterday. However, he has not had bowel motions. He has tolerated a full liquid diet without any vomiting.           Physical Exam: Blood pressure 147/85, pulse 84, temperature 98.5 F (36.9 C), temperature source Oral, resp. rate 20, height 5\' 8"  (1.727 m), weight 64.2 kg (141 lb 8.6 oz), SpO2 97.00%. Looks systemically well, not  septic or toxic. Abdomen is soft and mildly tender. There is clear cellulitis which has been demarcated with a pen mark and this is now improved significantly compared to yesterday.  Lung fields are clear. Heart sounds are present and normal. He is alert and orientated.   Investigations:     Basic Metabolic Panel:  Basename 09/11/11 0630 09/10/11 1756  NA 140 138  K 4.4 4.0  CL 104 101  CO2 27 25  GLUCOSE 92 96  BUN 7 7  CREATININE 0.92 0.89  CALCIUM 8.8 9.2  MG -- --  PHOS -- --   Liver Function Tests:  Basename 09/10/11 1756  AST 13  ALT 10  ALKPHOS 149*  BILITOT 0.3  PROT 6.9  ALBUMIN 3.9     CBC:  Basename 09/12/11 0625 09/11/11 0630 09/10/11 1756  WBC 12.5* 11.8* --  NEUTROABS -- -- 10.6*  HGB 13.1 13.0 --  HCT 39.3 38.6* --  MCV 89.3 88.9 --  PLT 172 180 --    Ct Abdomen Pelvis W Contrast  09/10/2011  *RADIOLOGY REPORT*  Clinical Data: Periumbilical abdominal pain which began earlier today.  History of colon cancer with multiple surgeries.  CT ABDOMEN AND PELVIS WITH CONTRAST  Technique:  Multidetector CT imaging of the abdomen and pelvis was performed following the standard protocol during bolus administration of intravenous contrast.  Contrast: OMNIPAQUE IOHEXOL 300 MG/ML. Oral contrast also administered.  Comparison: CT abdomen and pelvis 05/31/2011, 05/13/2011, 04/16/2011, 12/01/2010.  Findings: Since the 05/2011 examination,  interval significant increase in caliber of numerous loops of small bowel throughout the abdomen, with relatively decompressed distal small bowel.  The transition appears to be at the level of the distal jejunum or proximal ileum just to the left of midline at the level of the umbilicus.  No evidence of free intraperitoneal air.  No ascites. Moderate stool throughout the colon which is otherwise unremarkable.  Stable small hiatal hernia.  Stomach otherwise unremarkable.  Stable sub-centimeter cyst in the medial segment left lobe of liver; no significant focal hepatic parenchymal abnormalities. Normal appearing spleen, pancreas, and adrenal glands.  Gallbladder surgically absent.  No unexpected biliary ductal dilation. Extrarenal pelves involving both kidneys.  Cortical cysts involving the right kidney, the largest approximating 3.3 cm in diameter arising from the anterior mid kidney; no significant abnormalities involving either kidney.  Moderate abdominal aortic atherosclerosis without aneurysm.  Scattered normal-sized retroperitoneal lymph nodes; no significant lymphadenopathy.  Moderate prostate gland enlargement, particularly the median lobe. Normal seminal vesicles.  Normal appearing urinary bladder. Visualized lung bases clear.  Heart size upper normal.  Bone window images demonstrate osteopenia.  IMPRESSION:  1.  Small bowel obstruction, with the transition apparently involving the distal jejunum or proximal ileum in the left upper pelvis at the level of the umbilicus, likely an adhesion.  No free intraperitoneal air or ascites. 2.  Moderate prostate gland enlargement, particularly the median lobe.  Original Report  Authenticated By: Arnell Sieving, M.D.      Medications:  Scheduled:    . sodium chloride   Intravenous STAT  . levofloxacin  750 mg Oral Daily  . lidocaine      . oxymetazoline      . sodium chloride      . DISCONTD: vancomycin  1,000 mg Intravenous Q12H    Impression: 1.  Possible small bowel obstruction although clinically seems to be resolving. 2. History of colon cancer, status post surgery. 3. History of chronic abdominal pain with suspicion for drug-seeking behavior. 4. Cellulitis around the umbilicus.     Plan: 1. Discontinue intravenous vancomycin. Start oral Levaquin. 2. Advance diet as tolerated. 3. Mobilize. 4. Probable discharge home tomorrow if continues to improve.     LOS: 2 days   [name] Singer Pager [phone]  09/12/2011, 7:44 AM

## 2011-09-12 NOTE — Progress Notes (Signed)
Pt advanced to regular diet for lunch.  Ate 2 chicken legs and some potatoes.  Tolerated well.  No c/o discomfort, except for previous pain r/t abd site.  Dr. Karilyn Cota notified.

## 2011-09-12 NOTE — Progress Notes (Signed)
Notified Dr. Onalee Hua pt has temp of 99.8 she says no orders, just notify MD if temp >100.4. [name]

## 2011-09-12 NOTE — Progress Notes (Signed)
Subjective: No acute abdominal pain. Patient did have a bowel movement yesterday. He is tolerating full liquid diet well.  Objective: Vital signs in last 24 hours: Temp:  [98.5 F (36.9 C)-99.8 F (37.7 C)] 98.5 F (36.9 C) (06/23 0529) Pulse Rate:  [67-88] 84  (06/23 0529) Resp:  [20] 20  (06/23 0529) BP: (106-147)/(61-85) 147/85 mmHg (06/23 0529) SpO2:  [96 %-97 %] 97 % (06/23 0529) Last BM Date: 09/10/11  Intake/Output from previous day: 06/22 0701 - 06/23 0700 In: 1124 [P.O.:720; IV Piggyback:404] Out: 2100 [Urine:2100] Intake/Output this shift: Total I/O In: 120 [P.O.:120] Out: -   General appearance: alert, cooperative and no distress GI: Soft, flat. Erythema around the umbilicus significantly decreased. No abscess present. No wound present. Bowel sounds appreciated.  Lab Results:   Basename 09/12/11 0625 09/11/11 0630  WBC 12.5* 11.8*  HGB 13.1 13.0  HCT 39.3 38.6*  PLT 172 180   BMET  Basename 09/12/11 0625 09/11/11 0630  NA 137 140  K 4.0 4.4  CL 103 104  CO2 25 27  GLUCOSE 103* 92  BUN 5* 7  CREATININE 0.74 0.92  CALCIUM 8.9 8.8   PT/INR No results found for this basename: LABPROT:2,INR:2 in the last 72 hours  Studies/Results: Ct Abdomen Pelvis W Contrast  09/10/2011  *RADIOLOGY REPORT*  Clinical Data: Periumbilical abdominal pain which began earlier today.  History of colon cancer with multiple surgeries.  CT ABDOMEN AND PELVIS WITH CONTRAST  Technique:  Multidetector CT imaging of the abdomen and pelvis was performed following the standard protocol during bolus administration of intravenous contrast.  Contrast: OMNIPAQUE IOHEXOL 300 MG/ML. Oral contrast also administered.  Comparison: CT abdomen and pelvis 05/31/2011, 05/13/2011, 04/16/2011, 12/01/2010.  Findings: Since the 05/2011 examination, interval significant increase in caliber of numerous loops of small bowel throughout the abdomen, with relatively decompressed distal small bowel.  The  transition appears to be at the level of the distal jejunum or proximal ileum just to the left of midline at the level of the umbilicus.  No evidence of free intraperitoneal air.  No ascites. Moderate stool throughout the colon which is otherwise unremarkable.  Stable small hiatal hernia.  Stomach otherwise unremarkable.  Stable sub-centimeter cyst in the medial segment left lobe of liver; no significant focal hepatic parenchymal abnormalities. Normal appearing spleen, pancreas, and adrenal glands.  Gallbladder surgically absent.  No unexpected biliary ductal dilation. Extrarenal pelves involving both kidneys.  Cortical cysts involving the right kidney, the largest approximating 3.3 cm in diameter arising from the anterior mid kidney; no significant abnormalities involving either kidney.  Moderate abdominal aortic atherosclerosis without aneurysm.  Scattered normal-sized retroperitoneal lymph nodes; no significant lymphadenopathy.  Moderate prostate gland enlargement, particularly the median lobe. Normal seminal vesicles.  Normal appearing urinary bladder. Visualized lung bases clear.  Heart size upper normal.  Bone window images demonstrate osteopenia.  IMPRESSION:  1.  Small bowel obstruction, with the transition apparently involving the distal jejunum or proximal ileum in the left upper pelvis at the level of the umbilicus, likely an adhesion.  No free intraperitoneal air or ascites. 2.  Moderate prostate gland enlargement, particularly the median lobe.  Original Report Authenticated By: Arnell Sieving, M.D.    Anti-infectives: Anti-infectives     Start     Dose/Rate Route Frequency Ordered Stop   09/12/11 0745   levofloxacin (LEVAQUIN) tablet 750 mg        750 mg Oral Daily 09/12/11 0744     09/11/11 0900  vancomycin (VANCOCIN) IVPB 1000 mg/200 mL premix  Status:  Discontinued        1,000 mg 200 mL/hr over 60 Minutes Intravenous Every 12 hours 09/11/11 0806 09/12/11 0744   09/10/11 2100    vancomycin (VANCOCIN) IVPB 1000 mg/200 mL premix        1,000 mg 200 mL/hr over 60 Minutes Intravenous  Once 09/10/11 2056 09/10/11 2202          Assessment/Plan: Impression: Clinically no evidence of significant small bowel obstruction requiring surgery. Erythema around the umbilicus is resolving. Agree with advancing diet. Would continue by mouth antibiotics for cellulitis for 1 week. Agree with discharge over next 24 hours if patient continues to improve.  LOS: 2 days    Gailen Venne A 09/12/2011

## 2011-09-12 NOTE — Discharge Summary (Signed)
Physician Discharge Summary  [name] MRN: [id number] DOB/AGE: May 16, 1950 61 y.o. Primary Care Physician:DAVIDSON,ERIC, MD Admit date: 09/10/2011 Discharge date: 09/12/2011    Discharge Diagnoses:  1. Possible partial small bowel obstruction, clinically resolved. 2. Periumbilical cellulitis, improving. 3. Colon cancer status post abdominal surgeries. 4. Chronic abdominal pain.    Medication List  As of 09/12/2011  1:35 PM   TAKE these medications         cyanocobalamin 1000 MCG/ML injection   Commonly known as: (VITAMIN B-12)   Inject 1,000 mcg into the muscle every 30 (thirty) days.      dexlansoprazole 60 MG capsule   Commonly known as: DEXILANT   Take 1 capsule (60 mg total) by mouth daily.      folic acid 1 MG tablet   Commonly known as: FOLVITE   Take 1 mg by mouth daily.      levofloxacin 750 MG tablet   Commonly known as: LEVAQUIN   Take 1 tablet (750 mg total) by mouth daily.      LORazepam 1 MG tablet   Commonly known as: ATIVAN   Take 1 mg by mouth 2 (two) times daily as needed. anxiety      polyethylene glycol powder powder   Commonly known as: GLYCOLAX/MIRALAX   Take 17 g by mouth as needed. For constipation      prochlorperazine 10 MG tablet   Commonly known as: COMPAZINE   Take 10 mg by mouth every 6 (six) hours as needed. Nausea and Vomiting      sertraline 100 MG tablet   Commonly known as: ZOLOFT   Take 100 mg by mouth at bedtime.      sucralfate 1 GM/10ML suspension   Commonly known as: CARAFATE   Take 10 mLs (1 g total) by mouth 4 (four) times daily.      thiamine 100 MG tablet   Take 100 mg by mouth daily.      traMADol 50 MG tablet   Commonly known as: ULTRAM   Take 50 mg by mouth every 6 (six) hours as needed. For pain      valsartan-hydrochlorothiazide 80-12.5 MG per tablet   Commonly known as: DIOVAN-HCT   Take 1 tablet by mouth daily.            Discharged Condition: Stable and  improved.    Consults: Surgery, Dr. Franky Macho.  Significant Diagnostic Studies: Ct Head Wo Contrast  09/02/2011  *RADIOLOGY REPORT*  Clinical Data: Colon cancer, altered mental status  CT HEAD WITHOUT CONTRAST  Technique:  Contiguous axial images were obtained from the base of the skull through the vertex without contrast.  Comparison: 04/14/2010, 04/13/2010  Findings: Stable atrophy.  Slight progression of patchy subcortical and periventricular white matter chronic microvascular ischemic changes.  No definite focal edema or sulcal effacement. Symmetric ventricles.  No hydrocephalus, herniation, or extra-axial fluid collection.  Cisterns patent.  No cerebellar abnormality. Symmetric orbits.  No skull abnormality.  Sinuses and mastoids clear.  IMPRESSION: Atrophy and microvascular ischemic changes throughout the white matter, slightly progressed since 04/14/2010.  No acute intracranial hemorrhage or definite acute infarction.  Original Report Authenticated By: Judie Petit. Ruel Favors, M.D.   Ct Abdomen Pelvis W Contrast  09/10/2011  *RADIOLOGY REPORT*  Clinical Data: Periumbilical abdominal pain which began earlier today.  History of colon cancer with multiple surgeries.  CT ABDOMEN AND PELVIS WITH CONTRAST  Technique:  Multidetector CT imaging of the abdomen and pelvis was performed following  the standard protocol during bolus administration of intravenous contrast.  Contrast: OMNIPAQUE IOHEXOL 300 MG/ML. Oral contrast also administered.  Comparison: CT abdomen and pelvis 05/31/2011, 05/13/2011, 04/16/2011, 12/01/2010.  Findings: Since the 05/2011 examination, interval significant increase in caliber of numerous loops of small bowel throughout the abdomen, with relatively decompressed distal small bowel.  The transition appears to be at the level of the distal jejunum or proximal ileum just to the left of midline at the level of the umbilicus.  No evidence of free intraperitoneal air.  No ascites.  Moderate stool throughout the colon which is otherwise unremarkable.  Stable small hiatal hernia.  Stomach otherwise unremarkable.  Stable sub-centimeter cyst in the medial segment left lobe of liver; no significant focal hepatic parenchymal abnormalities. Normal appearing spleen, pancreas, and adrenal glands.  Gallbladder surgically absent.  No unexpected biliary ductal dilation. Extrarenal pelves involving both kidneys.  Cortical cysts involving the right kidney, the largest approximating 3.3 cm in diameter arising from the anterior mid kidney; no significant abnormalities involving either kidney.  Moderate abdominal aortic atherosclerosis without aneurysm.  Scattered normal-sized retroperitoneal lymph nodes; no significant lymphadenopathy.  Moderate prostate gland enlargement, particularly the median lobe. Normal seminal vesicles.  Normal appearing urinary bladder. Visualized lung bases clear.  Heart size upper normal.  Bone window images demonstrate osteopenia.  IMPRESSION:  1.  Small bowel obstruction, with the transition apparently involving the distal jejunum or proximal ileum in the left upper pelvis at the level of the umbilicus, likely an adhesion.  No free intraperitoneal air or ascites. 2.  Moderate prostate gland enlargement, particularly the median lobe.  Original Report Authenticated By: Arnell Sieving, M.D.    Lab Results: Basic Metabolic Panel:  Basename 09/12/11 0625 09/11/11 0630  NA 137 140  K 4.0 4.4  CL 103 104  CO2 25 27  GLUCOSE 103* 92  BUN 5* 7  CREATININE 0.74 0.92  CALCIUM 8.9 8.8  MG -- --  PHOS -- --   Liver Function Tests:  [facility] 09/12/11 0625 09/10/11 1756  AST 11 13  ALT 8 10  ALKPHOS 136* 149*  BILITOT 0.4 0.3  PROT 6.1 6.9  ALBUMIN 3.0* 3.9     CBC:  Basename 09/12/11 0625 09/11/11 0630 09/10/11 1756  WBC 12.5* 11.8* --  NEUTROABS -- -- 10.6*  HGB 13.1 13.0 --  HCT 39.3 38.6* --  MCV 89.3 88.9 --  PLT 172 180 --       Hospital  Course: This 61 year old man was admitted once again with symptoms of abdominal pain. On this occasion he also had erythematous changes in the periumbilical area indicative of cellulitis. CT scan of his abdomen was done which was questionable for small bowel obstruction. However clinically it did not appear that he had significant bowel obstruction. He was reviewed by Dr. Lovell Sheehan who agreed with this. Therefore he was treated conservatively with intravenous fluids and his diet was advanced. He was able to tolerate this without any vomiting. His cellulitis was treated with antibiotics and this also was improving at the time of discharge. He has tolerated her lunch without any vomiting. He is open his bowels yesterday and some today. He is stable for discharge.  Discharge Exam: Blood pressure 147/85, pulse 84, temperature 98.5 F (36.9 C), temperature source Oral, resp. rate 20, height 5\' 8"  (1.727 m), weight 64.2 kg (141 lb 8.6 oz), SpO2 97.00%. He looks systemically well. Heart sounds are present and normal. Lung fields are clear. His abdomen is  nondistended. He does have cellulitis in the periumbilical area which is improving. He is alert and orientated.  Disposition: Home. He will complete a further 7 day course of antibiotics. He will follow with primary care physician.  Discharge Orders    Future Appointments: Provider: Department: Dept Phone: Center:   10/28/2011 1:00 PM [facility] [phone] None   11/23/2011 10:00 AM [facility] [phone] None   11/23/2011 11:30 AM Ellouise Newer, [facility] [phone] None   02/22/2012 10:00 AM [facility] [phone] None     Future Orders Please Complete By Expires   Diet - low sodium heart healthy      Increase activity slowly         Follow-up Information    Follow up with Franky Macho A, MD. Schedule an appointment as soon as possible for a visit in 2 weeks.   Contact  information:   [address] [place] [address] [phone]          Signed: [name] Singer Pager [phone]  09/12/2011, 1:35 PM

## 2011-09-13 NOTE — Progress Notes (Signed)
Provided pt with d/c instructions and prescription for antibiotic, Levaquin.  Pt verbalized understanding of d/c instructions.  Port deaccessed.  Pt stable.

## 2011-09-14 NOTE — Progress Notes (Signed)
[facility] Chart Review Completed  

## 2011-10-28 ENCOUNTER — Encounter (HOSPITAL_COMMUNITY): Payer: Federal, State, Local not specified - PPO | Attending: Oncology

## 2011-10-28 DIAGNOSIS — Z452 Encounter for adjustment and management of vascular access device: Secondary | ICD-10-CM

## 2011-10-28 DIAGNOSIS — C189 Malignant neoplasm of colon, unspecified: Secondary | ICD-10-CM

## 2011-10-28 DIAGNOSIS — C186 Malignant neoplasm of descending colon: Secondary | ICD-10-CM

## 2011-10-28 MED ORDER — SODIUM CHLORIDE 0.9 % IJ SOLN
10.0000 mL | INTRAMUSCULAR | Status: DC | PRN
  Administered 2011-10-28: 10 mL via INTRAVENOUS
  Filled 2011-10-28: qty 10

## 2011-10-28 MED ORDER — SODIUM CHLORIDE 0.9 % IJ SOLN
INTRAMUSCULAR | Status: AC
  Filled 2011-10-28: qty 10

## 2011-10-28 MED ORDER — HEPARIN SOD (PORK) LOCK FLUSH 100 UNIT/ML IV SOLN
500.0000 [IU] | Freq: Once | INTRAVENOUS | Status: AC
  Administered 2011-10-28: 500 [IU] via INTRAVENOUS
  Filled 2011-10-28: qty 5

## 2011-10-28 MED ORDER — HEPARIN SOD (PORK) LOCK FLUSH 100 UNIT/ML IV SOLN
INTRAVENOUS | Status: AC
  Filled 2011-10-28: qty 5

## 2011-10-28 NOTE — Progress Notes (Signed)
[name] presented for Portacath access and flush. Proper placement of portacath confirmed by CXR. Portacath located left chest wall accessed with  H 20 needle. Good blood return present. Portacath flushed with 20ml NS and 500U/5ml Heparin and needle removed intact. Procedure without incident. Patient tolerated procedure well.   

## 2011-10-30 ENCOUNTER — Inpatient Hospital Stay (HOSPITAL_COMMUNITY)
Admission: EM | Admit: 2011-10-30 | Discharge: 2011-11-06 | DRG: 180 | Disposition: A | Discharge status: Home / Self Care | Payer: Federal, State, Local not specified - PPO | Attending: General Surgery | Admitting: General Surgery

## 2011-10-30 ENCOUNTER — Encounter (HOSPITAL_COMMUNITY): Payer: Self-pay

## 2011-10-30 DIAGNOSIS — K565 Intestinal adhesions [bands], unspecified as to partial versus complete obstruction: Principal | ICD-10-CM | POA: Diagnosis present

## 2011-10-30 DIAGNOSIS — F17200 Nicotine dependence, unspecified, uncomplicated: Secondary | ICD-10-CM | POA: Diagnosis present

## 2011-10-30 DIAGNOSIS — Z8701 Personal history of pneumonia (recurrent): Secondary | ICD-10-CM

## 2011-10-30 DIAGNOSIS — G4730 Sleep apnea, unspecified: Secondary | ICD-10-CM | POA: Diagnosis present

## 2011-10-30 DIAGNOSIS — K219 Gastro-esophageal reflux disease without esophagitis: Secondary | ICD-10-CM | POA: Diagnosis present

## 2011-10-30 DIAGNOSIS — C189 Malignant neoplasm of colon, unspecified: Secondary | ICD-10-CM | POA: Diagnosis present

## 2011-10-30 DIAGNOSIS — Z9089 Acquired absence of other organs: Secondary | ICD-10-CM

## 2011-10-30 DIAGNOSIS — Z8673 Personal history of transient ischemic attack (TIA), and cerebral infarction without residual deficits: Secondary | ICD-10-CM

## 2011-10-30 DIAGNOSIS — Z9049 Acquired absence of other specified parts of digestive tract: Secondary | ICD-10-CM

## 2011-10-30 DIAGNOSIS — I10 Essential (primary) hypertension: Secondary | ICD-10-CM | POA: Diagnosis present

## 2011-10-30 DIAGNOSIS — Z8249 Family history of ischemic heart disease and other diseases of the circulatory system: Secondary | ICD-10-CM

## 2011-10-30 DIAGNOSIS — Z903 Acquired absence of stomach [part of]: Secondary | ICD-10-CM

## 2011-10-30 DIAGNOSIS — K5989 Other specified functional intestinal disorders: Secondary | ICD-10-CM | POA: Diagnosis present

## 2011-10-30 DIAGNOSIS — F1011 Alcohol abuse, in remission: Secondary | ICD-10-CM | POA: Diagnosis present

## 2011-10-30 DIAGNOSIS — F411 Generalized anxiety disorder: Secondary | ICD-10-CM | POA: Diagnosis present

## 2011-10-30 DIAGNOSIS — Z86711 Personal history of pulmonary embolism: Secondary | ICD-10-CM

## 2011-10-30 DIAGNOSIS — R109 Unspecified abdominal pain: Secondary | ICD-10-CM | POA: Diagnosis present

## 2011-10-30 DIAGNOSIS — M810 Age-related osteoporosis without current pathological fracture: Secondary | ICD-10-CM | POA: Diagnosis present

## 2011-10-30 DIAGNOSIS — G8929 Other chronic pain: Secondary | ICD-10-CM | POA: Diagnosis present

## 2011-10-30 DIAGNOSIS — M129 Arthropathy, unspecified: Secondary | ICD-10-CM | POA: Diagnosis present

## 2011-10-30 MED ORDER — ENOXAPARIN SODIUM 60 MG/0.6ML ~~LOC~~ SOLN
60.0000 mg | Freq: Every day | SUBCUTANEOUS | Status: DC
  Administered 2011-10-30 – 2011-11-05 (×7): 60 mg via SUBCUTANEOUS
  Filled 2011-10-30 (×7): qty 0.6

## 2011-10-30 MED ORDER — BISACODYL 10 MG RE SUPP
10.0000 mg | Freq: Two times a day (BID) | RECTAL | Status: DC
  Administered 2011-10-31 – 2011-11-06 (×12): 10 mg via RECTAL
  Filled 2011-10-30 (×12): qty 1

## 2011-10-30 MED ORDER — ONDANSETRON HCL 4 MG/2ML IJ SOLN
4.0000 mg | Freq: Four times a day (QID) | INTRAMUSCULAR | Status: DC | PRN
  Administered 2011-10-31 – 2011-11-06 (×18): 4 mg via INTRAVENOUS
  Filled 2011-10-30 (×18): qty 2

## 2011-10-30 MED ORDER — KCL IN DEXTROSE-NACL 20-5-0.45 MEQ/L-%-% IV SOLN
INTRAVENOUS | Status: DC
  Administered 2011-10-30 – 2011-11-05 (×15): via INTRAVENOUS

## 2011-10-30 MED ORDER — VALSARTAN-HYDROCHLOROTHIAZIDE 80-12.5 MG PO TABS: 1.0000 | ORAL_TABLET | Freq: Every morning | ORAL | Status: DC

## 2011-10-30 MED ORDER — LORAZEPAM 2 MG/ML IJ SOLN
1.0000 mg | INTRAMUSCULAR | Status: DC
  Administered 2011-10-30: 1 mg via INTRAVENOUS
  Administered 2011-10-31: 02:00:00 via INTRAVENOUS
  Administered 2011-10-31 – 2011-11-05 (×32): 1 mg via INTRAVENOUS
  Filled 2011-10-30 (×34): qty 1

## 2011-10-30 MED ORDER — IRBESARTAN 75 MG PO TABS
75.0000 mg | ORAL_TABLET | Freq: Every day | ORAL | Status: DC
  Administered 2011-10-31 – 2011-11-06 (×7): 75 mg via ORAL
  Filled 2011-10-30 (×7): qty 1

## 2011-10-30 MED ORDER — HYDROCHLOROTHIAZIDE 12.5 MG PO CAPS
12.5000 mg | ORAL_CAPSULE | Freq: Every day | ORAL | Status: DC
  Administered 2011-10-31 – 2011-11-06 (×7): 12.5 mg via ORAL
  Filled 2011-10-30 (×7): qty 1

## 2011-10-30 MED ORDER — NICOTINE 21 MG/24HR TD PT24
21.0000 mg | MEDICATED_PATCH | Freq: Every day | TRANSDERMAL | Status: DC
  Administered 2011-10-31 – 2011-11-06 (×7): 21 mg via TRANSDERMAL
  Filled 2011-10-30 (×7): qty 1

## 2011-10-30 MED ORDER — HYDROMORPHONE HCL PF 1 MG/ML IJ SOLN
1.0000 mg | INTRAMUSCULAR | Status: DC | PRN
  Administered 2011-10-30 – 2011-11-06 (×59): 1 mg via INTRAVENOUS
  Filled 2011-10-30 (×59): qty 1

## 2011-10-30 MED ORDER — PANTOPRAZOLE SODIUM 40 MG IV SOLR
40.0000 mg | Freq: Every day | INTRAVENOUS | Status: DC
  Administered 2011-10-30 – 2011-10-31 (×2): 40 mg via INTRAVENOUS
  Filled 2011-10-30 (×2): qty 40

## 2011-10-30 MED ORDER — SODIUM CHLORIDE 0.9 % IJ SOLN
INTRAMUSCULAR | Status: AC
  Administered 2011-10-30: 10 mL
  Filled 2011-10-30: qty 3

## 2011-10-30 NOTE — H&P (Signed)
[name] is an 61 y.o. male.   Chief Complaint: Nausea and vomiting HPI: Patient is a 61 year old white male who is had multiple abdominal surgeries in the past, status post a left partial colectomy by Dr. Leticia [name] in May 2012 who presents with a less than 24-hour history of nausea and vomiting. It started approximately 10 hours ago. He presented to [facility] emergency room with this complaint. A CT scan the abdomen and pelvis was performed there are which revealed a partial small bowel obstruction with dilated proximal small bowel but air within the colon. There was an area of narrowed small bowel distally. Contrast had not passed through this area yet. Surgery was consulted day did not want to accept the patient. I was called by the emergency room physician in [place] and accepted transfer of the patient by ambulance to [facility]. He is comfortable without significant abdominal pain. He has a  small NG tube in place. He states his last bowel movement was 2 days ago. His last colonoscopy was in December of 2012. This was done by Dr. Jena [name] of GI.  Past Medical History  Diagnosis Date  . Pulmonary embolism 02/2010  . Hypertension   . Osteoporosis   . Arthritis   . TIA (transient ischemic attack) 10/11  . ETOH abuse     quit 03/2010  . 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 Personal history of PE (pulmonary embolism) 10/01/2010  . Blood transfusion   . S/P endoscopy September 28, 2010    erosive reflux esophagitis, Billroth I anatomy  . Shortness of breath   . Sleep apnea   . Recurrent upper respiratory infection (URI)   . Anxiety   . Pneumonia   . Anemia   . Ileus   . Chronic abdominal pain   . GERD (gastroesophageal reflux disease)   . Chest pain at rest   . Erosive esophagitis   . 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 Hernia repair     right inguinal  . Appendectomy 1980s  . Cholecystectomy 1980s  .  Colon surgery May 2012    left hemicolectomy, colon cancer found at time of surgery for bowel obstruction  . Portacath placement   . Abdominal sugery     for bowel obstruction x 8, all in 1980s, except for one in 07/2010  . Esophagogastroduodenoscopy 09/28/2010  . Esophagogastroduodenoscopy 12/01/2010    Cervical web status post dilation, erosive esophagitis, B1 hemigastrectomy, inflamed anastomosis  . Colonoscopy 03/18/2011    anastomosis at 35cm. Several adenomatous polyps removed. Sigmoid diverticulosis. Next TCS 02/2013  . Esophagogastroduodenoscopy 04/16/2011    excoriation at [facility] c/w trauma/M-W tear, friable gastric anastomosis, dilation efferent limb  . 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 Hypertension Mother   . Arthritis Mother   . Pneumonia Mother   . Hypertension Father   . Heart attack Father    Social History:  reports that he has been smoking Cigarettes.  He has a 20 pack-year smoking history. He does not have any smokeless tobacco history on file. He reports that he does not drink alcohol or use illicit drugs.  Allergies: No Known Allergies   (Not in a hospital admission)  No results found for this or any previous visit (from the past 48 hour(s)). No results found.  Review of Systems  Constitutional: Negative.   HENT: Negative.   Eyes: Negative.   Respiratory: Negative.   Cardiovascular: Negative.   Gastrointestinal: Positive for nausea and vomiting.  Genitourinary: Negative.   Musculoskeletal: Negative.   Skin: Negative.   Neurological: Negative.   Endo/Heme/Allergies: Negative.   Psychiatric/Behavioral: Negative.     Blood pressure 137/85, pulse 76, temperature 98.4 F (36.9 C), temperature source Oral, resp. rate 18, height 5\' 8"  (1.727 m), weight 63.504 kg (140 lb), SpO2 99.00%. Physical Exam  Constitutional: He is oriented to person, place, and time. He appears  well-developed and well-nourished.  HENT:  Head: Normocephalic and atraumatic.  Neck: Normal range of motion. Neck supple.  Cardiovascular: Normal rate and normal heart sounds.        Occasional skipped beats.  Respiratory: Effort normal and breath sounds normal.  GI: Soft. He exhibits distension. There is tenderness. There is guarding. There is no rebound.       Occasional bowel sounds appreciated.  Neurological: He is alert and oriented to person, place, and time.  Skin: Skin is warm and dry.  Psychiatric: He has a normal mood and affect. His behavior is normal. Judgment and thought content normal.    Labs including a cmet and CBC done in [facility] [employer] [facility] are all unremarkable. His urinalysis is negative. Assessment/Plan Impression: Partial small bowel obstruction probably secondary to adhesive disease given his multiple abdominal surgeries in the past. His most recent admission to [facility] was 2 months ago. His symptoms resolved without surgery. I anticipate that he will require several days of nasogastric tube decompression. Will admit to [facility] for IV fluids, control of his nausea, and control of any pain that may occur. There is no need for acute surgical intervention at this time. The patient understands and agrees to the treatment plan.  [name] A 10/30/2011, 8:53 PM

## 2011-10-30 NOTE — ED Notes (Signed)
Pt with onset of abd pain and nausea, went to [facility] for treatment and sent here for eval by dr Lovell Sheehan d/t previous hx of abd surgeries here at [facility].   Pt has radiology disk from ct which showed mild bowel obstruction per RN at [facility] via nurse to nurse report.  Pt has ngt in place to LIS and had dilaudid for pain, dilaudid and zofran at 1805 prior to departing [facility].  Pt rates pain 2/10 at this time

## 2011-10-30 NOTE — ED Notes (Signed)
Pt presents to Ed as transfer from [facility] as a surgical candidate of Dr. Girtha Rm and/or Dr Lovell Sheehan as pt has had multiple abdominal surgeries at [facility]. Pt has a Nasogastric tube intact patent and attached to ILWS. Small amount clear drainage noted in tubing. Pt also has a left side port-a-cath accessed and heparin locked at Previous medical facility. Pt is alert, oriented x 4. Side rails up per pt safety. Dr Lovell Sheehan at bedside, examined pt and discussed plan of care with said pt.

## 2011-10-30 NOTE — ED Provider Notes (Signed)
Medical screening exam:  61 year old male was transferred here from [facility] because of small bowel obstruction. Dr. Lovell Sheehan is here seeing the patient. He is resting comfortably and in no acute distress. Vital signs are normal.  Dione Booze, MD 10/30/11 2047

## 2011-10-31 ENCOUNTER — Inpatient Hospital Stay (HOSPITAL_COMMUNITY): Payer: Federal, State, Local not specified - PPO

## 2011-10-31 MED ORDER — SODIUM CHLORIDE 0.9 % IJ SOLN
INTRAMUSCULAR | Status: AC
  Filled 2011-10-31: qty 3

## 2011-10-31 NOTE — Progress Notes (Signed)
  Subjective: No significant change since yesterday evening. Minimal NG tube output. No bowel movement or flatus yet.  Objective: Vital signs in last 24 hours: Temp:  [98.1 F (36.7 C)-98.4 F (36.9 C)] 98.2 F (36.8 C) (08/11 0500) Pulse Rate:  [75-90] 75  (08/11 0500) Resp:  [18-20] 18  (08/11 0500) BP: (125-137)/(73-85) 125/77 mmHg (08/11 0500) SpO2:  [93 %-99 %] 93 % (08/11 0500) Weight:  [63.504 kg (140 lb)-65.227 kg (143 lb 12.8 oz)] 65.227 kg (143 lb 12.8 oz) (08/10 2201) Last BM Date: 10/28/11  Intake/Output from previous day: 08/10 0701 - 08/11 0700 In: 822.9 [I.V.:822.9] Out: 825 [Urine:825] Intake/Output this shift:    General appearance: alert, cooperative and no distress Resp: clear to auscultation bilaterally Cardio: regular rate and rhythm, S1, S2 normal, no murmur, click, rub or gallop GI: Soft. No rigidity noted. Minimal bowel sounds appreciated.  Lab Results:  No results found for this basename: WBC:2,HGB:2,HCT:2,PLT:2 in the last 72 hours BMET No results found for this basename: NA:2,K:2,CL:2,CO2:2,GLUCOSE:2,BUN:2,CREATININE:2,CALCIUM:2 in the last 72 hours PT/INR No results found for this basename: LABPROT:2,INR:2 in the last 72 hours  Studies/Results: No results found.  Anti-infectives: Anti-infectives    None      Assessment/Plan: Impression: Partial small bowel obstruction, resolving. KUB today shows contrast in the colon which is dilated. Will see how things progress over the next 24 hours. Anastomosis noted to be patent by colonoscopy in December of 2012. Doubt stenosis. Patient has multiple medical problems which could exacerbate an ileus.  LOS: 1 day    Jaana Brodt A 10/31/2011

## 2011-11-01 LAB — BASIC METABOLIC PANEL
CO2: 26 mEq/L (ref 19–32)
Calcium: 9 mg/dL (ref 8.4–10.5)
Creatinine, Ser: 0.79 mg/dL (ref 0.50–1.35)
Glucose, Bld: 133 mg/dL — ABNORMAL HIGH (ref 70–99)

## 2011-11-01 LAB — CBC
MCH: 29.8 pg (ref 26.0–34.0)
MCHC: 33.3 g/dL (ref 30.0–36.0)
MCV: 89.5 fL (ref 78.0–100.0)
Platelets: 170 10*3/uL (ref 150–400)
RDW: 14.8 % (ref 11.5–15.5)

## 2011-11-01 MED ORDER — SODIUM CHLORIDE 0.9 % IJ SOLN
INTRAMUSCULAR | Status: AC
  Administered 2011-11-01: 10 mL
  Filled 2011-11-01: qty 3

## 2011-11-01 MED ORDER — SODIUM CHLORIDE 0.9 % IJ SOLN
INTRAMUSCULAR | Status: AC
  Filled 2011-11-01: qty 6

## 2011-11-01 MED ORDER — SERTRALINE HCL 50 MG PO TABS
100.0000 mg | ORAL_TABLET | Freq: Every day | ORAL | Status: DC
  Administered 2011-11-01 – 2011-11-06 (×6): 100 mg via ORAL
  Filled 2011-11-01 (×6): qty 2

## 2011-11-01 MED ORDER — PANTOPRAZOLE SODIUM 40 MG PO TBEC
40.0000 mg | DELAYED_RELEASE_TABLET | Freq: Every day | ORAL | Status: DC
  Administered 2011-11-01 – 2011-11-06 (×6): 40 mg via ORAL
  Filled 2011-11-01 (×6): qty 1

## 2011-11-01 MED ORDER — BIOTENE DRY MOUTH MT LIQD
15.0000 mL | Freq: Two times a day (BID) | OROMUCOSAL | Status: DC
  Administered 2011-11-01 – 2011-11-06 (×11): 15 mL via OROMUCOSAL

## 2011-11-01 MED ORDER — SODIUM CHLORIDE 0.9 % IJ SOLN
INTRAMUSCULAR | Status: AC
  Administered 2011-11-01: 15:00:00
  Filled 2011-11-01: qty 3

## 2011-11-01 MED ORDER — SODIUM CHLORIDE 0.9 % IJ SOLN
INTRAMUSCULAR | Status: AC
  Filled 2011-11-01: qty 3

## 2011-11-01 MED ORDER — SODIUM CHLORIDE 0.9 % IJ SOLN
INTRAMUSCULAR | Status: AC
  Administered 2011-11-01: 11:00:00
  Filled 2011-11-01: qty 3

## 2011-11-01 MED ORDER — POLYETHYLENE GLYCOL 3350 17 G PO PACK
17.0000 g | PACK | Freq: Every day | ORAL | Status: DC
  Administered 2011-11-01 – 2011-11-03 (×3): 17 g via ORAL
  Filled 2011-11-01 (×3): qty 1

## 2011-11-01 NOTE — Progress Notes (Signed)
  Subjective: Has had a bowel movement yesterday night. Minimal NG tube output since that time  Objective: Vital signs in last 24 hours: Temp:  [98.1 F (36.7 C)-98.3 F (36.8 C)] 98.3 F (36.8 C) (08/12 0500) Pulse Rate:  [67-93] 77  (08/12 0500) Resp:  [16-20] 20  (08/12 0500) BP: (108-135)/(71-83) 113/79 mmHg (08/12 0500) SpO2:  [95 %-98 %] 98 % (08/12 0500) Last BM Date: 11/01/11  Intake/Output from previous day: 08/11 0701 - 08/12 0700 In: -  Out: 2400 [Urine:1850; Emesis/NG output:550] Intake/Output this shift: Total I/O In: 10 [I.V.:10] Out: 200 [Emesis/NG output:200]  General appearance: alert, cooperative and no distress GI: Soft, flat. Bowel sounds appreciated. No tenderness, rigidity, or distention noted.  Lab Results:   Basename 11/01/11 0530  WBC 9.1  HGB 13.1  HCT 39.3  PLT 170   BMET  Basename 11/01/11 0530  NA 136  K 4.2  CL 103  CO2 26  GLUCOSE 133*  BUN 4*  CREATININE 0.79  CALCIUM 9.0   PT/INR No results found for this basename: LABPROT:2,INR:2 in the last 72 hours  Studies/Results: Dg Abd Portable 2v  10/31/2011  *RADIOLOGY REPORT*  Clinical Data:  Recent history of small bowel obstruction.  Remote history of partial gastrectomy and Bilroth 1 anastomosis.  PORTABLE ABDOMEN - 2 VIEW  Comparison: CT abdomen and pelvis 09/10/2011.  Acute abdomen series 06/07/2011.  Findings: Bowel gas pattern nonobstructive currently.  Gas and large amount of high density stool throughout upper normal caliber colon.  Surgical anastomotic suture material in the left mid abdomen, with numerous surgical clips in the left upper quadrant from prior gastrectomy.  No free intraperitoneal air on the erect image.  Note is made of the fact that the gastrostomy tube is looped in the proximal stomach with its tip back up in the distal esophagus. Contrast material is present within the urinary bladder.  IMPRESSION: No evidence of bowel obstruction currently.  Large stool  burden. Please note that the gastrostomy tube is looped in the stomach with its tip back up into the esophagus; this may need to be withdrawn and re-advanced.  Original Report Authenticated By: Arnell Sieving, M.D.    Anti-infectives: Anti-infectives    None      Assessment/Plan: Impression: Small bowel obstruction, resolving.  Plan: We will remove gastric tube and start full liquid diet. We'll encourage ambulation.  LOS: 2 days    Sheilyn Boehlke A 11/01/2011

## 2011-11-01 NOTE — Care Management Note (Unsigned)
    Page 1 of 1   11/01/2011     10:51:59 AM   CARE MANAGEMENT NOTE 11/01/2011  Patient:  [name], [name]   Account Number:  [id number]  Date Initiated:  11/01/2011  Documentation initiated by:  Sharrie Rothman  Subjective/Objective Assessment:   Pt admitted from with partial SBO. Pt lives with wife and will return home at discharge. Pt is independent with ADL's.     Action/Plan:   No CM/HH needs noted.   Anticipated DC Date:  11/04/2011   Anticipated DC Plan:  HOME/SELF CARE      DC Planning Services  CM consult      Choice offered to / List presented to:             Status of service:  Completed, signed off Medicare Important Message given?   (If response is "NO", the following Medicare IM given date fields will be blank) Date Medicare IM given:   Date Additional Medicare IM given:    Discharge Disposition:    Per UR Regulation:    If discussed at Long Length of Stay Meetings, dates discussed:    Comments:  11/01/11 1051 Arlyss Queen, RN BSN CM

## 2011-11-01 NOTE — Progress Notes (Signed)
UR chart review completed.  

## 2011-11-02 LAB — CBC
MCH: 29.2 pg (ref 26.0–34.0)
MCHC: 32.6 g/dL (ref 30.0–36.0)
Platelets: 172 10*3/uL (ref 150–400)

## 2011-11-02 LAB — BASIC METABOLIC PANEL
BUN: 3 mg/dL — ABNORMAL LOW (ref 6–23)
Calcium: 9 mg/dL (ref 8.4–10.5)
GFR calc non Af Amer: >90 mL/min (ref >90)
Glucose, Bld: 118 mg/dL — ABNORMAL HIGH (ref 70–99)

## 2011-11-02 MED ORDER — SODIUM CHLORIDE 0.9 % IJ SOLN
INTRAMUSCULAR | Status: AC
  Filled 2011-11-02: qty 3

## 2011-11-02 NOTE — Progress Notes (Signed)
  Subjective: States he is tired but improved. He is tolerating full liquid diet. Has had bowel movements. Denies abdominal pain.  Objective: Vital signs in last 24 hours: Temp:  [98.4 F (36.9 C)-99.2 F (37.3 C)] 99.2 F (37.3 C) (08/13 0543) Pulse Rate:  [78-90] 83  (08/13 0543) Resp:  [19-20] 20  (08/13 0543) BP: (111-138)/(75-91) 111/75 mmHg (08/13 0543) SpO2:  [96 %-98 %] 97 % (08/13 0543) Last BM Date: 11/01/11  Intake/Output from previous day: 08/12 0701 - 08/13 0700 In: 5477.5 [P.O.:240; I.V.:5237.5] Out: 2400 [Urine:2200; Emesis/NG output:200] Intake/Output this shift:    General appearance: alert, cooperative and no distress Resp: clear to auscultation bilaterally Cardio: regular rate and rhythm, S1, S2 normal, no murmur, click, rub or gallop GI: soft, non-tender; bowel sounds normal; no masses,  no organomegaly  Lab Results:   Basename 11/02/11 0511 11/01/11 0530  WBC 9.8 9.1  HGB 12.6* 13.1  HCT 38.7* 39.3  PLT 172 170   BMET  Basename 11/02/11 0511 11/01/11 0530  NA 136 136  K 4.5 4.2  CL 101 103  CO2 28 26  GLUCOSE 118* 133*  BUN 3* 4*  CREATININE 0.80 0.79  CALCIUM 9.0 9.0   PT/INR No results found for this basename: LABPROT:2,INR:2 in the last 72 hours  Studies/Results: No results found.  Anti-infectives: Anti-infectives    None      Assessment/Plan: Impression: Small bowel obstruction, resolving. Plan: We'll advance to regular diet. Should he tolerate this, I anticipate discharge in the next 24-48 hours.  LOS: 3 days    Gino Garrabrant A 11/02/2011

## 2011-11-03 MED ORDER — MAGNESIUM HYDROXIDE 400 MG/5ML PO SUSP
30.0000 mL | Freq: Every day | ORAL | Status: DC
  Administered 2011-11-03 – 2011-11-06 (×4): 30 mL via ORAL
  Filled 2011-11-03 (×4): qty 30

## 2011-11-03 NOTE — Progress Notes (Signed)
  Subjective: Complaint of new onset of nausea. Did have bowel function yesterday but nothing today. Denies any abdominal pain.  Objective: Vital signs in last 24 hours: Temp:  [98 F (36.7 C)-98.3 F (36.8 C)] 98.1 F (36.7 C) (08/14 0642) Pulse Rate:  [74-88] 74  (08/14 0642) Resp:  [19-20] 20  (08/14 0642) BP: (104-112)/(63-76) 104/63 mmHg (08/14 0642) SpO2:  [95 %-96 %] 95 % (08/14 0642) Last BM Date: 11/02/11  Intake/Output from previous day: 08/13 0701 - 08/14 0700 In: 2248.8 [P.O.:520; I.V.:1728.8] Out: 2301 [Urine:2300; Stool:1] Intake/Output this shift: Total I/O In: 240 [P.O.:240] Out: -   General appearance: alert and no distress GI: Positive bowel sounds, soft, flat, and nontender to palpation.  Lab Results:   Basename 11/02/11 0511 11/01/11 0530  WBC 9.8 9.1  HGB 12.6* 13.1  HCT 38.7* 39.3  PLT 172 170   BMET  Basename 11/02/11 0511 11/01/11 0530  NA 136 136  K 4.5 4.2  CL 101 103  CO2 28 26  GLUCOSE 118* 133*  BUN 3* 4*  CREATININE 0.80 0.79  CALCIUM 9.0 9.0   PT/INR No results found for this basename: LABPROT:2,INR:2 in the last 72 hours ABG No results found for this basename: PHART:2,PCO2:2,PO2:2,HCO3:2 in the last 72 hours  Studies/Results: No results found.  Anti-infectives: Anti-infectives    None      Assessment/Plan: s/p * No surgery found * Small bowel obstruction. Still with some nausea but has demonstrated evidence of improving bowel function. No acute surgical findings. We'll continue bowel stimulation. We'll add milk of magnesia to see if this help some of the symptomatology.  LOS: 4 days    Arthella Headings C 11/03/2011

## 2011-11-04 ENCOUNTER — Inpatient Hospital Stay (HOSPITAL_COMMUNITY): Payer: Federal, State, Local not specified - PPO

## 2011-11-04 MED ORDER — POLYETHYLENE GLYCOL 3350 17 G PO PACK
17.0000 g | PACK | Freq: Two times a day (BID) | ORAL | Status: DC
  Administered 2011-11-04 – 2011-11-06 (×4): 17 g via ORAL
  Filled 2011-11-04 (×5): qty 1

## 2011-11-04 NOTE — Progress Notes (Signed)
  Subjective: Still with nausea, but no vomiting. He states he is passing flatus but did not have a bowel movement yesterday, though a bowel movement is listed by nursing staff.  Objective: Vital signs in last 24 hours: Temp:  [98.5 F (36.9 C)-99.3 F (37.4 C)] 98.5 F (36.9 C) (08/15 0500) Pulse Rate:  [77-101] 83  (08/15 0500) Resp:  [16-20] 16  (08/15 0500) BP: (105-142)/(73-87) 105/73 mmHg (08/15 0500) SpO2:  [94 %-96 %] 94 % (08/15 0500) Last BM Date: 11/02/11  Intake/Output from previous day: 08/14 0701 - 08/15 0700 In: 2006.3 [P.O.:360; I.V.:1646.3] Out: 2250 [Urine:2250] Intake/Output this shift:    General appearance: alert, cooperative and no distress Resp: clear to auscultation bilaterally Cardio: regular rate and rhythm, S1, S2 normal, no murmur, click, rub or gallop GI: soft, non-tender; bowel sounds normal; no masses,  no organomegaly  Lab Results:   [facility] 11/02/11 0511  WBC 9.8  HGB 12.6*  HCT 38.7*  PLT 172   BMET  Basename 11/02/11 0511  NA 136  K 4.5  CL 101  CO2 28  GLUCOSE 118*  BUN 3*  CREATININE 0.80  CALCIUM 9.0   PT/INR No results found for this basename: LABPROT:2,INR:2 in the last 72 hours  Studies/Results: No results found.  Anti-infectives: Anti-infectives    None      Assessment/Plan: Impression: Small bowel obstruction, slowly resolving. 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 Surgery is challenging given his multiple surgeries. We'll try to get him on a bowel regimen to treat this medically.  LOS: 5 days    Antwann Preziosi A 11/04/2011

## 2011-11-05 MED ORDER — SODIUM CHLORIDE 0.9 % IJ SOLN
INTRAMUSCULAR | Status: AC
  Administered 2011-11-05: 10 mL
  Filled 2011-11-05: qty 3

## 2011-11-05 MED ORDER — SODIUM CHLORIDE 0.9 % IJ SOLN
INTRAMUSCULAR | Status: AC
  Administered 2011-11-05: 3 mL
  Filled 2011-11-05: qty 3

## 2011-11-05 NOTE — Progress Notes (Signed)
  Subjective: States he had an episode of emesis yesterday, though there is no record of it.  Objective: Vital signs in last 24 hours: Temp:  [97.7 F (36.5 C)-98.1 F (36.7 C)] 97.7 F (36.5 C) (08/16 0500) Pulse Rate:  [59-79] 59  (08/16 0500) Resp:  [16-18] 18  (08/16 0500) BP: (98-104)/(65-67) 98/66 mmHg (08/16 0500) SpO2:  [96 %-97 %] 97 % (08/16 0500) Last BM Date: 11/02/11  Intake/Output from previous day: 08/15 0701 - 08/16 0700 In: 775 [P.O.:100; I.V.:675] Out: 1450 [Urine:1450] Intake/Output this shift:    General appearance: alert and cooperative Resp: clear to auscultation bilaterally Cardio: regular rate and rhythm, S1, S2 normal, no murmur, click, rub or gallop GI: soft, non-tender; bowel sounds normal; no masses,  no organomegaly  Lab Results:  No results found for this basename: WBC:2,HGB:2,HCT:2,PLT:2 in the last 72 hours BMET No results found for this basename: NA:2,K:2,CL:2,CO2:2,GLUCOSE:2,BUN:2,CREATININE:2,CALCIUM:2 in the last 72 hours PT/INR No results found for this basename: LABPROT:2,INR:2 in the last 72 hours  Studies/Results: Dg Abd 1 View  11/04/2011  *RADIOLOGY REPORT*  Clinical Data: Small bowel obstruction.  ABDOMEN - 1 VIEW  Comparison: 10/31/2011  Findings: No current evidence of bowel obstruction.  Bowel gas pattern similar to prior study with mild gaseous distention of colon and moderate stool burden within the colon.  Suspect mild ileus.  Surgical clips within the mid and left abdomen.  No evidence of free air.  IMPRESSION: No evidence of bowel obstruction.  Suspect mild ileus.  Original Report Authenticated By: Cyndie Chime, M.D.    Anti-infectives: Anti-infectives    None      Assessment/Plan: Impression: Bowel abs in the secondary to multiple surgical procedures in the abdomen. Given his lack of physical findings, mechanical obstruction less likely. Surgical intervention is very risky in this patient. I am also suspect of the  fact that his episodes of vomiting have not been recorded by nursing staff. We'll give him soapsuds enema today. I told him I anticipated discharge in the next 24-48 hours.  LOS: 6 days    Jadin Kagel A 11/05/2011

## 2011-11-05 NOTE — Progress Notes (Signed)
11/05/11 1822 Patient states no stool today, small amount of stool noted to rectal area with placement of dulcolax suppository this evening, instructed patient to notify nursing of any stools and/or further vomiting. Verbalized understanding. Will notify night shift nurse to monitor. [name]

## 2011-11-05 NOTE — Progress Notes (Signed)
11/05/11 1369 Patient stated episode of vomiting this afternoon after eating lunch. Stated tolerated diet well, but became nauseated and vomited "everything came back up". Vomiting not witnessed per nursing staff, instructed to notify nursing if any further nausea/vomiting. Text-paged Dr Lovell Sheehan to notify of vomiting episode. Ambulates in hallway, tolerated well. States no stool this afternoon after receiving soap suds enema, and scheduled dulcolax suppository, miralax, and milk of magnesia as ordered today. Nursing to monitor. [name]

## 2011-11-06 MED ORDER — HEPARIN SOD (PORK) LOCK FLUSH 100 UNIT/ML IV SOLN
500.0000 [IU] | INTRAVENOUS | Status: AC | PRN
  Administered 2011-11-06: 500 [IU]
  Filled 2011-11-06: qty 5

## 2011-11-06 MED ORDER — SODIUM CHLORIDE 0.9 % IJ SOLN
INTRAMUSCULAR | Status: AC
  Administered 2011-11-06: 10 mL
  Filled 2011-11-06: qty 3

## 2011-11-06 MED ORDER — TRAMADOL HCL 50 MG PO TABS: 50.0000 mg | ORAL_TABLET | ORAL | Status: DC | PRN

## 2011-11-06 NOTE — Progress Notes (Signed)
11/06/11 1308 Patient being discharged home. Port-a-cath flushed with 10 ml saline flush and heparin flush per protocol per B. Hunt, Charity fundraiser. Reviewed discharge instructions with patient via teachback. Given copy of instructions, med list, prescription, f/u appointment info. Verbalized understanding. Denied nausea and pain prior to discharge. Stated would be driving himself home and MD said okay. Stated no dizziness at time of discharge and able to drive home.  Ambulated in hallway at time of discharge per pt request, left floor in stable condition accompanied by nurse tech. [name]

## 2011-11-06 NOTE — Discharge Summary (Signed)
Physician Discharge Summary  [name] MRN: [id number] DOB/AGE: 03/23/1950 61 y.o.  Admit date: 10/30/2011 Discharge date: 11/06/2011  Admission Diagnoses: Small bowel obstruction versus ileus  Discharge Diagnoses: Intestinal atony Active Problems:  * No active hospital problems. *    Discharged Condition: fair  Hospital Course: Patient is a 61 year old white male with a history of multiple abdominal surgeries, last undergoing a left partial colectomy by Dr. Leticia Penna over one year ago who presented to [facility] with nausea and vomiting. CT scan of the abdomen and pelvis revealed a partial small bowel obstruction. Surgery refused to take the patient and [facility], thus the patient was transferred [facility] and admitted to the surgery service here. Conservative management was performed. He did have an NG tube which subclavian was removed. His bowel function slowly returned. He stated he had episodes of vomiting, though there is no nursing documentation. He is still undergoing therapy through oncology here at [facility]. 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 There is no need for surgical intervention at this time. His diet was advanced to a regular diet. He is being discharged home on 11/06/2011 in fair and improving condition.  Discharge Exam: Blood pressure 97/62, pulse 62, temperature 98.3 F (36.8 C), temperature source Oral, resp. rate 16, height 5\' 8"  (1.727 m), weight 65.227 kg (143 lb 12.8 oz), SpO2 97.00%. General appearance: alert and cooperative Resp: clear to auscultation bilaterally Cardio: regular rate and rhythm, S1, S2 normal, no murmur, click, rub or gallop GI: soft, non-tender; bowel sounds normal; no masses,  no organomegaly  Disposition: 01-Home or Self Care  Discharge Orders    Future Appointments: Provider: Department: Dept Phone: Center:   11/23/2011 10:00 AM [facility] [phone] None   11/23/2011 11:30 AM Ellouise Newer, PA [facility] [phone] None   02/22/2012 10:00 AM [facility] [phone] None     Medication List  As of 11/06/2011  9:58 AM   STOP taking these medications         sulfamethoxazole-trimethoprim 800-160 MG per tablet         TAKE these medications         alendronate 70 MG tablet   Commonly known as: FOSAMAX   Take 70 mg by mouth every 7 (seven) days. On sundays      atorvastatin 20 MG tablet   Commonly known as: LIPITOR   Take 20 mg by mouth at bedtime.      cyanocobalamin 1000 MCG/ML injection   Commonly known as: (VITAMIN B-12)   Inject 1,000 mcg into the muscle every 30 (thirty) days.      dexlansoprazole 60 MG capsule   Commonly known as: DEXILANT   Take 60 mg by mouth every morning.      enoxaparin 60 MG/0.6ML injection   Commonly known as: LOVENOX   Inject 60 mg into the skin at bedtime.      folic acid 1 MG tablet   Commonly known as: FOLVITE   Take 1 mg by mouth every morning.      Juice Plus Fibre Liqd   Take 1 Can by mouth daily.      LORazepam 1 MG tablet   Commonly known as: ATIVAN   Take 1 mg by mouth every 3 (three) hours as needed. anxiety      polyethylene glycol powder powder   Commonly known as: GLYCOLAX/MIRALAX   Take 17  g by mouth as needed. For constipation      prochlorperazine 10 MG tablet   Commonly known as: COMPAZINE   Take 10 mg by mouth every 6 (six) hours as needed. Nausea and Vomiting      sertraline 100 MG tablet   Commonly known as: ZOLOFT   Take 100 mg by mouth at bedtime.      sucralfate 1 GM/10ML suspension   Commonly known as: CARAFATE   Take 1 g by mouth 4 (four) times daily.      thiamine 100 MG tablet   Take 100 mg by mouth every morning.      traMADol 50 MG tablet   Commonly known as: ULTRAM   Take 1-2 tablets (50-100 mg total) by mouth every 4 (four) hours as needed. For pain       valsartan-hydrochlorothiazide 80-12.5 MG per tablet   Commonly known as: DIOVAN-HCT   Take 1 tablet by mouth every morning.           Follow-up Information    Follow up with Dalia Heading, MD. (As needed)    Contact information:   [address] [place] [address] [phone]          Signed: Dalia Heading 11/06/2011, 9:58 AM

## 2011-11-23 ENCOUNTER — Encounter (HOSPITAL_COMMUNITY): Payer: Federal, State, Local not specified - PPO | Attending: Oncology

## 2011-11-23 ENCOUNTER — Encounter (HOSPITAL_BASED_OUTPATIENT_CLINIC_OR_DEPARTMENT_OTHER): Payer: Federal, State, Local not specified - PPO | Admitting: Oncology

## 2011-11-23 VITALS — BP 129/85 | HR 91 | Temp 98.3°F | Resp 18 | Wt 139.5 lb

## 2011-11-23 DIAGNOSIS — C189 Malignant neoplasm of colon, unspecified: Secondary | ICD-10-CM

## 2011-11-23 DIAGNOSIS — G8929 Other chronic pain: Secondary | ICD-10-CM

## 2011-11-23 DIAGNOSIS — C186 Malignant neoplasm of descending colon: Secondary | ICD-10-CM

## 2011-11-23 LAB — CEA: CEA: 2 ng/mL (ref 0.0–5.0)

## 2011-11-23 NOTE — Progress Notes (Signed)
DAVIDSON,ERIC, MD [address] [place] [address]  1. Adenocarcinoma of colon with mucinous features     CURRENT THERAPY: Observation with every 3 months CEA  INTERVAL HISTORY: DERYL GIROUX 61 y.o. male returns for  regular  visit for followup of Stage III cancer of the colon, status post surgical resection, and postsurgical adjuvant chemotherapy for 7 doses of a planned 12 of FOLFOX.    Tyra is doing well, but reports that he feels worse today than he did 1 year ago when he was getting chemotherapy.  His CT scan performed approximately 3 weeks ago did not show any oncologic issues.  Therefore, his poor feeling is not cancer nor chemotherapy related.    He continues to be admitted for continued abdominal pain and he has had multiple CT scans of Abdomen.  Thus far, all have been unremarkable oncologically speaking.  His most recent CEA in June was 2.9, and one is pending from today.   Gerik relays a story about his son, daughter-in-law, and 2 grandchildren have moved in with him and his wife.  His son served 2 tours in [place] and he reports that his daughter-in-law is  Bipolar and abuses drugs.  He explains that they had an argument and the daughter-in-law reported to the ED overdosing on [employer] stolen Fentanyl patches.    Gildardo reports that he continues to smoke tobacco, 1/2 ppd.  He denies any EtOH abuse however.    He denies any specific complaints today.    Past Medical History  Diagnosis Date  . Pulmonary embolism 02/2010  . Hypertension   . Osteoporosis   . Arthritis   . TIA (transient ischemic attack) 10/11  . ETOH abuse     quit 03/2010  . 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 Personal history of PE (pulmonary embolism) 10/01/2010  . Blood transfusion   . S/P endoscopy September 28, 2010    erosive reflux esophagitis, Billroth I anatomy  . Shortness of breath   . Sleep apnea   . Recurrent upper respiratory infection (URI)   . Anxiety   . Pneumonia   .  Anemia   . Ileus   . Chronic abdominal pain   . GERD (gastroesophageal reflux disease)   . Chest pain at rest   . Erosive esophagitis   . 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    Stage 3    has Adenocarcinoma of colon with mucinous features; Bleeding gastrointestinal; Supratherapeutic INR; Abdominal  pain, other specified site; Nausea and vomiting in adult; Anemia, chronic disease; Fever chills; S/P partial gastrectomy; Personal history of PE (pulmonary embolism); Bronchitis, acute; Erosive esophagitis; Esophageal reflux disease; Coffee ground emesis; Hematemesis; Chronic abdominal pain; Pancytopenia due to antineoplastic chemotherapy; Pneumonia; Pulmonary embolism; Chest pain at rest; Small bowel obstruction; Left sided abdominal pain; GI bleed; Coagulopathy; Gastroenteritis; Partial small bowel obstruction; Nausea; Abdominal distension; HTN (hypertension); Diarrhea; and Cellulitis on his problem list.      has no known allergies.  Mr. Beier does not currently have medications on file.  Past Surgical History  Procedure Date  . Hernia repair     right inguinal  . Appendectomy 1980s  . Cholecystectomy 1980s  . Colon surgery May 2012    left hemicolectomy, colon cancer found at time of surgery for bowel obstruction  . Portacath placement   . Abdominal sugery     for bowel obstruction x 8, all in 1980s, except for one in 07/2010  . Esophagogastroduodenoscopy 09/28/2010  .  Esophagogastroduodenoscopy 12/01/2010    Cervical web status post dilation, erosive esophagitis, B1 hemigastrectomy, inflamed anastomosis  . Colonoscopy 03/18/2011    anastomosis at 35cm. Several adenomatous polyps removed. Sigmoid diverticulosis. Next TCS 02/2013  . Esophagogastroduodenoscopy 04/16/2011    excoriation at GEJ c/w trauma/M-W tear, friable gastric anastomosis, dilation efferent limb  . 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   PHYSICAL EXAMINATION  ECOG PERFORMANCE STATUS: 1 - Symptomatic but completely ambulatory  Filed Vitals:   11/23/11 1124  BP: 129/85  Pulse: 91  Temp: 98.3 F (36.8 C)  Resp: 18    GENERAL:alert, no distress, comfortable, cooperative and smiling SKIN: skin color, texture, turgor are normal, no rashes or significant lesions HEAD: Normocephalic, No masses, lesions, tenderness or abnormalities EYES: normal, Conjunctiva are pink and non-injected EARS: External ears normal OROPHARYNX:lips, buccal mucosa, and tongue normal, mucous membranes are moist and poor dentition with only a few teeth left.   NECK: supple, thyroid normal size, non-tender, without nodularity, no stridor, non-tender, moderate, benign, submandibular B/L, trachea midline LYMPH:  no palpable lymphadenopathy, no hepatosplenomegaly BREAST:not examined LUNGS: clear to auscultation and percussion HEART: regular rate & rhythm, no murmurs, no gallops, S1 normal and S2 normal ABDOMEN:abdomen soft, non-tender, normal bowel sounds, no masses or organomegaly and no hepatosplenomegaly BACK: Back symmetric, no curvature., No CVA tenderness EXTREMITIES:less then 2 second capillary refill, no joint deformities, effusion, or inflammation, no edema, no skin discoloration, no clubbing, no cyanosis  NEURO: alert & oriented x 3 with fluent speech, no focal motor/sensory deficits, gait normal    LABORATORY DATA: CBC    Component Value Date/Time   WBC 9.8 11/02/2011 0511   RBC 4.31 11/02/2011 0511   HGB 12.6* 11/02/2011 0511   HCT 38.7* 11/02/2011 0511   PLT 172 11/02/2011 0511   MCV 89.8 11/02/2011 0511   MCH 29.2 11/02/2011 0511   MCHC 32.6 11/02/2011 0511   RDW 14.5 11/02/2011 0511    LYMPHSABS 2.1 09/10/2011 1756   MONOABS 1.1* 09/10/2011 1756   EOSABS 0.0 09/10/2011 1756   BASOSABS 0.0 09/10/2011 1756      Chemistry      Component Value Date/Time   NA 136 11/02/2011 0511   K 4.5 11/02/2011 0511   CL 101 11/02/2011 0511   CO2 28 11/02/2011 0511   BUN 3* 11/02/2011 0511   CREATININE 0.80 11/02/2011 0511      Component Value Date/Time   CALCIUM 9.0 11/02/2011 0511   ALKPHOS 136* 09/12/2011 0625   AST 11 09/12/2011 0625   ALT 8 09/12/2011 0625   BILITOT 0.4 09/12/2011 0625     Lab Results  Component Value Date   CEA 2.9 09/02/2011      PENDING LABS: CEA   RADIOGRAPHIC STUDIES:  11/04/2011  *RADIOLOGY REPORT*  Clinical Data: Small bowel obstruction.  ABDOMEN - 1 VIEW  Comparison: 10/31/2011  Findings: No current evidence of bowel obstruction. Bowel gas  pattern similar to prior study with mild gaseous distention of  colon and moderate stool burden within the colon. Suspect mild  ileus. Surgical clips within the mid and left abdomen. No  evidence of free air.  IMPRESSION:  No evidence of bowel obstruction. Suspect mild ileus.  Original Report Authenticated By: Aubery Lapping  Kearney Hard, M.D.      09/02/2011  *RADIOLOGY REPORT*  Clinical Data: Periumbilical abdominal pain which began earlier  today. History of colon cancer with multiple surgeries.  CT ABDOMEN AND PELVIS WITH CONTRAST  Technique: Multidetector CT imaging of the abdomen and pelvis was  performed following the standard protocol during bolus  administration of intravenous contrast.  Contrast: OMNIPAQUE IOHEXOL 300 MG/ML. Oral contrast also  administered.  Comparison: CT abdomen and pelvis 05/31/2011, 05/13/2011,  04/16/2011, 12/01/2010.  Findings: Since the 05/2011 examination, interval significant  increase in caliber of numerous loops of small bowel throughout the  abdomen, with relatively decompressed distal small bowel. The  transition appears to be at the level of the distal jejunum or    proximal ileum just to the left of midline at the level of the  umbilicus. No evidence of free intraperitoneal air. No ascites.  Moderate stool throughout the colon which is otherwise  unremarkable. Stable small hiatal hernia. Stomach otherwise  unremarkable.  Stable sub-centimeter cyst in the medial segment left lobe of  liver; no significant focal hepatic parenchymal abnormalities.  Normal appearing spleen, pancreas, and adrenal glands. Gallbladder  surgically absent. No unexpected biliary ductal dilation.  Extrarenal pelves involving both kidneys. Cortical cysts involving  the right kidney, the largest approximating 3.3 cm in diameter  arising from the anterior mid kidney; no significant abnormalities  involving either kidney. Moderate abdominal aortic atherosclerosis  without aneurysm. Scattered normal-sized retroperitoneal lymph  nodes; no significant lymphadenopathy.  Moderate prostate gland enlargement, particularly the median lobe.  Normal seminal vesicles. Normal appearing urinary bladder.  Visualized lung bases clear. Heart size upper normal. Bone window  images demonstrate osteopenia.  IMPRESSION:  1. Small bowel obstruction, with the transition apparently  involving the distal jejunum or proximal ileum in the left upper  pelvis at the level of the umbilicus, likely an adhesion. No free  intraperitoneal air or ascites.  2. Moderate prostate gland enlargement, particularly the median  lobe.  Original Report Authenticated By: Arnell Sieving, M.D.     ASSESSMENT:  1. Stage III cancer of the colon, status post surgical resection, and postsurgical adjuvant chemotherapy for 7 doses of a planned 12 of FOLFOX.  2. History of gastrointestinal bleeding.  3. History of multiple abdominal surgical procedures, 12 in number.  4. Chronic pain.  5. History of excessive alcohol use in the past.  6. History of pulmonary embolus, S/P 6 months of anticoagulation.   7. History of  probable Coumadin toxicity with an elevated INR in the a setting of a person who denied any Coumadin use but had a positive Coumadin level, even though it was very unusual.  8. History of pneumonia.  9. History of a transient ischemic attack.  10.History of hypertension.  11.History of acid reflux disease.  12.Grade 1 peripheral neuropathy.   PLAN:  1. I personally reviewed and went over radiographic studies with the patient. 2. CT scan report at [facility] reviewed revealing possible partial small bowel obstruction (mild). 3. I personally reviewed and went over laboratory results with the patient. 4. CEA every 3 months.  5. Smoking cessation education provided.  6. Return in 6 months for follow-up.   All questions were answered. The patient knows to call the clinic with any problems, questions or concerns. We can certainly see the patient much sooner if necessary.  The patient and plan discussed with Glenford Peers, MD and he is in agreement with the aforementioned.  Patient seen and examined  by Dr. Glenford Peers as well.    [name]

## 2011-11-23 NOTE — Patient Instructions (Signed)
[facility]  Discharge Instructions [name]  DOB May 15, 1950 CSN [id number]  MRN [id number] Dr. Glenford Peers  RECOMMENDATIONS MADE BY THE CONSULTANT AND ANY TEST RESULTS WILL BE SENT TO YOUR REFERRING DOCTOR.   EXAM FINDINGS BY MD TODAY AND SIGNS AND SYMPTOMS TO REPORT TO CLINIC OR PRIMARY MD: exam per T. Kefalas PA  INSTRUCTIONS GIVEN AND DISCUSSED: Labs every 3 months  SPECIAL INSTRUCTIONS/FOLLOW-UP: See dr. In 6 months   I acknowledge that I have been informed and understand all the instructions given to me and received a copy. I do not have any more questions at this time, but understand that I may call the Specialty Clinic at [facility] at [phone] during business hours should I have any further questions or need assistance in obtaining follow-up care.    __________________________________________  _____________  __________ Signature of Patient or Authorized Representative            Date                   Time    __________________________________________ Nurse's Signature

## 2011-11-23 NOTE — Progress Notes (Signed)
Labs drawn today for cea 

## 2011-12-23 ENCOUNTER — Telehealth: Payer: Self-pay | Admitting: *Deleted

## 2011-12-23 NOTE — Telephone Encounter (Signed)
Patient with multiple episodes of bowel obstructions, one on CT 08/2011. We have not seen patient since 05/2011. Admitted 10/2011 with SBO vs ileus.   Based on symptoms, unable to eat/drink due to vomiting, I would advise him to go to ED to expedite evaluation and for hydration.

## 2011-12-23 NOTE — Telephone Encounter (Signed)
I spoke with pt and he agreed to go to ED.

## 2011-12-23 NOTE — Telephone Encounter (Signed)
Spoke with pt- he is having N/V x 2 days, occasional L side abd pain (close to his navel) and bright red blood in his stool. Last BM was Monday and that is when he noticed the blood in his stool. Stool was runny on that day. Pt has not had BM since and has been taking miralax daily. No fever. Pt rates pain at a 5. He is taking tramadol 50mg .   Pt has a hx of CRC and bowel obstruction. Do we need to bring him in or send him to ED? Please advise.

## 2011-12-23 NOTE — Telephone Encounter (Signed)
[name] talked with Pt and documented his symptoms.

## 2011-12-23 NOTE — Telephone Encounter (Signed)
Pt has been having vomitting, nausea, had red blood in his stool on Monday.  He would like someone to call him back. Thanks.

## 2012-01-21 DIAGNOSIS — Z8619 Personal history of other infectious and parasitic diseases: Secondary | ICD-10-CM

## 2012-01-21 HISTORY — DX: Personal history of other infectious and parasitic diseases: Z86.19

## 2012-02-22 ENCOUNTER — Encounter: Payer: Self-pay | Admitting: Gastroenterology

## 2012-02-22 ENCOUNTER — Encounter (HOSPITAL_COMMUNITY): Payer: Federal, State, Local not specified - PPO | Attending: Oncology

## 2012-02-22 ENCOUNTER — Ambulatory Visit (INDEPENDENT_AMBULATORY_CARE_PROVIDER_SITE_OTHER): Payer: Federal, State, Local not specified - PPO | Admitting: Gastroenterology

## 2012-02-22 ENCOUNTER — Encounter (HOSPITAL_COMMUNITY): Payer: Self-pay | Admitting: Pharmacy Technician

## 2012-02-22 VITALS — BP 126/85 | HR 102 | Temp 98.3°F | Ht 68.0 in | Wt 135.8 lb

## 2012-02-22 DIAGNOSIS — K56600 Partial intestinal obstruction, unspecified as to cause: Secondary | ICD-10-CM

## 2012-02-22 DIAGNOSIS — C189 Malignant neoplasm of colon, unspecified: Secondary | ICD-10-CM | POA: Insufficient documentation

## 2012-02-22 DIAGNOSIS — R197 Diarrhea, unspecified: Secondary | ICD-10-CM

## 2012-02-22 DIAGNOSIS — Z8619 Personal history of other infectious and parasitic diseases: Secondary | ICD-10-CM

## 2012-02-22 DIAGNOSIS — R1310 Dysphagia, unspecified: Secondary | ICD-10-CM | POA: Insufficient documentation

## 2012-02-22 DIAGNOSIS — K56609 Unspecified intestinal obstruction, unspecified as to partial versus complete obstruction: Secondary | ICD-10-CM

## 2012-02-22 DIAGNOSIS — C186 Malignant neoplasm of descending colon: Secondary | ICD-10-CM

## 2012-02-22 DIAGNOSIS — Z9889 Other specified postprocedural states: Secondary | ICD-10-CM

## 2012-02-22 DIAGNOSIS — R1319 Other dysphagia: Secondary | ICD-10-CM | POA: Insufficient documentation

## 2012-02-22 DIAGNOSIS — Z903 Acquired absence of stomach [part of]: Secondary | ICD-10-CM

## 2012-02-22 DIAGNOSIS — R1314 Dysphagia, pharyngoesophageal phase: Secondary | ICD-10-CM

## 2012-02-22 DIAGNOSIS — K219 Gastro-esophageal reflux disease without esophagitis: Secondary | ICD-10-CM

## 2012-02-22 MED ORDER — SUCRALFATE 1 GM/10ML PO SUSP: 1.0000 g | Freq: Four times a day (QID) | ORAL | Status: DC

## 2012-02-22 NOTE — Assessment & Plan Note (Signed)
Recurrent esophageal solid food dysphagia, early satiety in setting of recent partial SBO, vomiting, s/p previous Billroth I hemigastrectomy. Patient will continue Dexilant 60mg  daily. Two weeks of Carafate but advised patient this is short-term medication as it appears he has used chronically at this point. EGD/ED with Dr. Jena Gauss in near future. Last dose of Lovenox at 10am day before the procedure. Further instructions to follow procedure by Dr. Jena Gauss.  I have discussed the risks, alternatives, benefits with regards to but not limited to the risk of reaction to medication, bleeding, infection, perforation and the patient is agreeable to proceed. Written consent to be obtained.

## 2012-02-22 NOTE — Progress Notes (Signed)
Primary Care Physician:  [name], MD  Primary Gastroenterologist:  Roetta Sessions, MD   Chief Complaint  Patient presents with  . Dysphagia    HPI:  [name] is a 61 y.o. male here for further evaluation of dysphagia. Patient last seen in 04/2011 for urgent visit at request of his PCP for suspected recurrent SBO. KUB by PCP at that time showed mildly prominent bowel loop in left mid abd measuring up to 3.5cm with scattered minimal air fluid level. Also prominence of bowel wall in LUQ. Stool and air noted in rectum. STAT CT done here failed to show transition point.   Last EGD was 03/2011 by Dr. [name]-->excoriation and friability at [facility] (trauma or M-W equivalent), small hh, fribability of gastric mucosa at anastomosis, efferent limb intubated 10-15cm, subjective appeared dilated but no mucosa abnormality.   He presents today after another hospitalization for partial SBO at [facility] the first of October. States he was told he had C.Diff but was unable to have BMs for one week while in the hospital. Did not require NG tube. Lots of vomiting at that time. Since then, feels like something in throat, hard to swallow, lot of gas/bloating/abd pain. Washes pills down. Feels hungry, eats little, feels full and bloated, takes a long time to swallow.  No heartburn. Takes Dexilant daily but is in need of prior authorization and RX. Office has been working on this and will f/u on it again today.    No vomiting. BM QOD, watery, very little formed. No blood in stool. No melena.  Current Outpatient Prescriptions  Medication Sig Dispense Refill  . alendronate (FOSAMAX) 70 MG tablet Take 70 mg by mouth every 7 (seven) days. On sundays      . atorvastatin (LIPITOR) 20 MG tablet Take 20 mg by mouth at bedtime.       . cyanocobalamin (,VITAMIN B-12,) 1000 MCG/ML injection Inject 1,000 mcg into the muscle every 30 (thirty) days.      . cyclobenzaprine (FLEXERIL) 10 MG tablet Take 10 mg by mouth.        . dexlansoprazole (DEXILANT) 60 MG capsule Take 60 mg by mouth every morning.      . enoxaparin (LOVENOX) 60 MG/0.6ML injection Inject 60 mg into the skin daily.       . folic acid (FOLVITE) 1 MG tablet Take 1 mg by mouth every morning.       [name] Hydrocodone-Acetaminophen 7.5-300 MG TABS 7.5 mg as needed.      [name] LORazepam (ATIVAN) 1 MG tablet Take 1 mg by mouth every 3 (three) hours as needed. anxiety      . Nutritional Supplements (JUICE PLUS FIBRE) LIQD Take 1 Can by mouth daily.      . polyethylene glycol powder (GLYCOLAX/MIRALAX) powder Take 17 g by mouth as needed. For constipation      . prochlorperazine (COMPAZINE) 10 MG tablet Take 10 mg by mouth every 6 (six) hours as needed. Nausea and Vomiting      . sertraline (ZOLOFT) 100 MG tablet Take 100 mg by mouth at bedtime.      . sucralfate (CARAFATE) 1 GM/10ML suspension Take 10 mLs (1 g total) by mouth 4 (four) times daily.  420 mL  0  . thiamine 100 MG tablet Take 100 mg by mouth every morning.       . traMADol (ULTRAM) 50 MG tablet Take 1-2 tablets (50-100 mg total) by mouth every 4 (four) hours as needed. For pain  30  tablet  1  . valsartan-hydrochlorothiazide (DIOVAN-HCT) 80-12.5 MG per tablet Take 1 tablet by mouth every morning.         Allergies as of 02/22/2012  . (No Known Allergies)    Past Medical History  Diagnosis Date  . Pulmonary embolism 02/2010  . Hypertension   . Osteoporosis   . Arthritis   . TIA (transient ischemic attack) 10/11  . ETOH abuse     quit 03/2010  . 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 Personal history of PE (pulmonary embolism) 10/01/2010  . Blood transfusion   . S/P endoscopy September 28, 2010    erosive reflux esophagitis, Billroth I anatomy  . Shortness of breath   . Sleep apnea   . Recurrent upper respiratory infection (URI)   . Anxiety   . Pneumonia   . Anemia   . Ileus   . Chronic abdominal pain   . GERD (gastroesophageal reflux disease)   . Chest pain at rest   . Erosive esophagitis    . 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 Hernia repair     right inguinal  . Appendectomy 1980s  . Cholecystectomy 1980s  . Colon surgery May 2012    left hemicolectomy, colon cancer found at time of surgery for bowel obstruction  . Portacath placement   . Abdominal sugery     for bowel obstruction x 8, all in 1980s, except for one in 07/2010  . Esophagogastroduodenoscopy 09/28/2010  . Esophagogastroduodenoscopy 12/01/2010    Cervical web status post dilation, erosive esophagitis, B1 hemigastrectomy, inflamed anastomosis  . Colonoscopy 03/18/2011    anastomosis at 35cm. Several adenomatous polyps removed. Sigmoid diverticulosis. Next TCS 02/2013  . Esophagogastroduodenoscopy 04/16/2011    excoriation at [facility] c/w trauma/M-W tear, friable gastric anastomosis, dilation efferent limb  . 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 Hypertension Mother   . Arthritis Mother   . Pneumonia Mother   . Hypertension Father   . Heart attack Father     History   Social History  . Marital Status: Married    Spouse Name: N/A    Number of Children: 3  . Years of Education: N/A   Occupational History  .  [employer]   Social History Main Topics  . Smoking status: Current Every Day Smoker -- 0.5 packs/day for 40 years    Types: Cigarettes  . Smokeless tobacco: Not on file  . Alcohol Use: No     Comment: last drink was Apr 16 2010  . Drug Use: No  . Sexually Active: Yes   Other Topics Concern  . Not on file   Social History Narrative  . No narrative on file      ROS:  General: Negative for anorexia, fever, chills, fatigue, weakness. Weight down 10 pounds since 04/2011 ov. Eyes: Negative for vision changes.  ENT: Negative for hoarseness,  nasal congestion. CV: Negative for chest pain, angina, palpitations, dyspnea on  exertion, peripheral edema.  Respiratory: Negative for dyspnea at rest, dyspnea on exertion, cough, sputum, wheezing.  GI: See history of present illness. GU:  Negative for dysuria, hematuria, urinary incontinence, urinary frequency, nocturnal urination.  MS: Negative for joint pain, low back pain.  Derm: Negative for rash or itching.  Neuro: Negative for weakness, abnormal sensation, seizure,  frequent headaches, memory loss, confusion.  Psych: Negative for anxiety, depression, suicidal ideation, hallucinations.  Endo:weight down 10 pounds since 04/2011 OV. Heme: Negative for bruising or bleeding. Allergy: Negative for rash or hives.    Physical Examination:  BP 126/85  Pulse 102  Temp 98.3 F (36.8 C) (Temporal)  Ht 5\' 8"  (1.727 m)  Wt 135 lb 12.8 oz (61.598 kg)  BMI 20.65 kg/m2   General: Well-nourished, well-developed in no acute distress.  Head: Normocephalic, atraumatic.   Eyes: Conjunctiva pink, no icterus. Mouth: Oropharyngeal mucosa moist and pink , no lesions erythema or exudate. Neck: Supple without thyromegaly, masses, or lymphadenopathy.  Lungs: Clear to auscultation bilaterally.  Heart: Regular rate and rhythm, no murmurs rubs or gallops.  Abdomen: Bowel sounds are normal, nontender, nondistended, no hepatosplenomegaly or masses, no abdominal bruits or    hernia , no rebound or guarding.   Rectal: not performed Extremities: No lower extremity edema. No clubbing or deformities.  Neuro: Alert and oriented x 4 , grossly normal neurologically.  Skin: Warm and dry, no rash or jaundice.   Psych: Alert and cooperative, normal mood and affect.  Labs: Lab Results  Component Value Date   WBC 9.8 11/02/2011   HGB 12.6* 11/02/2011   HCT 38.7* 11/02/2011   MCV 89.8 11/02/2011   PLT 172 11/02/2011   Lab Results  Component Value Date   CREATININE 0.80 11/02/2011   BUN 3* 11/02/2011   NA 136 11/02/2011   K 4.5 11/02/2011   CL 101 11/02/2011   CO2 28 11/02/2011   Lab Results   Component Value Date   ALT 8 09/12/2011   AST 11 09/12/2011   ALKPHOS 136* 09/12/2011   BILITOT 0.4 09/12/2011   CEA pending.  Imaging Studies: No results found.

## 2012-02-22 NOTE — Patient Instructions (Addendum)
Please collect stool specimen for C.Diff. I will review copy of your recent hospital stay from [facility]. New prescription for carafate provided. This is to be for short-term use only. We have scheduled you for an upper endoscopy with esophageal dilation by Dr. Jena Gauss. See separate instructions.  You will take your last dose of Lovenox by 10am the day before your procedure. Do not take the morning of your procedure. Dr. Jena Gauss will give you further instructions after your procedure.

## 2012-02-22 NOTE — Progress Notes (Signed)
Labs drawn today for cea 

## 2012-02-22 NOTE — Assessment & Plan Note (Signed)
Recent hospitalization at [facility] for partial SBO and C.Diff. Obtain copy of records.   Patient has also had abnormal alk phos in past. We will f/u recent hospital labs.

## 2012-02-22 NOTE — Progress Notes (Signed)
Faxed to PCP

## 2012-02-22 NOTE — Assessment & Plan Note (Signed)
Diarrhea with recent h/o C.Diff s/p 2 week course of Flagyl. Recheck stool to verify eradication given ongoing watery stools.

## 2012-02-23 LAB — CEA: CEA: 2.4 ng/mL (ref 0.0–5.0)

## 2012-02-23 NOTE — Progress Notes (Signed)
Reviewed records from [facility] hospitalization dated 01/31/12 to 02/07/12. Patient presented with partial SBO treated conservatively. Positive C.Diff PCR. Recent augmentin for dental issues.   Labs showed: TSH 0.64, Tbili 0.7, AP 82, AST 16, ALT 10, alb 3.0, BUN 4, Creatinine 0.71, Na 135, Potassium 3.9, glucose 107, calcium 8.1, magnesium 1.7, WBC 8.4, H/H 12/37.2, MCV 91.3, Platelet 257,000   

## 2012-02-24 MED ORDER — SODIUM CHLORIDE 0.45 % IV SOLN
INTRAVENOUS | Status: DC
  Administered 2012-02-25: 14:00:00 via INTRAVENOUS

## 2012-02-25 ENCOUNTER — Ambulatory Visit (HOSPITAL_COMMUNITY)
Admission: RE | Admit: 2012-02-25 | Discharge: 2012-02-25 | Disposition: A | Discharge status: Home / Self Care | Payer: Federal, State, Local not specified - PPO | Source: Ambulatory Visit | Attending: Internal Medicine | Admitting: Internal Medicine

## 2012-02-25 ENCOUNTER — Encounter (HOSPITAL_COMMUNITY): Payer: Self-pay

## 2012-02-25 ENCOUNTER — Encounter (HOSPITAL_COMMUNITY)
Admission: RE | Disposition: A | Discharge status: Home / Self Care | Payer: Self-pay | Source: Ambulatory Visit | Attending: Internal Medicine

## 2012-02-25 DIAGNOSIS — R1319 Other dysphagia: Secondary | ICD-10-CM

## 2012-02-25 DIAGNOSIS — Q391 Atresia of esophagus with tracheo-esophageal fistula: Secondary | ICD-10-CM

## 2012-02-25 DIAGNOSIS — I10 Essential (primary) hypertension: Secondary | ICD-10-CM | POA: Insufficient documentation

## 2012-02-25 DIAGNOSIS — Q393 Congenital stenosis and stricture of esophagus: Secondary | ICD-10-CM

## 2012-02-25 DIAGNOSIS — Z8619 Personal history of other infectious and parasitic diseases: Secondary | ICD-10-CM

## 2012-02-25 DIAGNOSIS — K56600 Partial intestinal obstruction, unspecified as to cause: Secondary | ICD-10-CM

## 2012-02-25 DIAGNOSIS — K219 Gastro-esophageal reflux disease without esophagitis: Secondary | ICD-10-CM | POA: Insufficient documentation

## 2012-02-25 DIAGNOSIS — R933 Abnormal findings on diagnostic imaging of other parts of digestive tract: Secondary | ICD-10-CM

## 2012-02-25 DIAGNOSIS — K56609 Unspecified intestinal obstruction, unspecified as to partial versus complete obstruction: Secondary | ICD-10-CM

## 2012-02-25 DIAGNOSIS — R1310 Dysphagia, unspecified: Secondary | ICD-10-CM | POA: Insufficient documentation

## 2012-02-25 HISTORY — PX: ESOPHAGOGASTRODUODENOSCOPY (EGD) WITH ESOPHAGEAL DILATION: SHX5812

## 2012-02-25 SURGERY — ESOPHAGOGASTRODUODENOSCOPY (EGD) WITH ESOPHAGEAL DILATION
Anesthesia: Moderate Sedation

## 2012-02-25 MED ORDER — MIDAZOLAM HCL 5 MG/5ML IJ SOLN
INTRAMUSCULAR | Status: AC
  Filled 2012-02-25: qty 10

## 2012-02-25 MED ORDER — MEPERIDINE HCL 100 MG/ML IJ SOLN
INTRAMUSCULAR | Status: AC
  Filled 2012-02-25: qty 2

## 2012-02-25 MED ORDER — MEPERIDINE HCL 100 MG/ML IJ SOLN
INTRAMUSCULAR | Status: DC | PRN
  Administered 2012-02-25 (×3): 50 mg via INTRAVENOUS

## 2012-02-25 MED ORDER — BUTAMBEN-TETRACAINE-BENZOCAINE 2-2-14 % EX AERO
INHALATION_SPRAY | CUTANEOUS | Status: DC | PRN
  Administered 2012-02-25: 2 via TOPICAL

## 2012-02-25 MED ORDER — STERILE WATER FOR IRRIGATION IR SOLN
Status: DC | PRN
  Administered 2012-02-25: 14:00:00

## 2012-02-25 MED ORDER — MIDAZOLAM HCL 5 MG/5ML IJ SOLN
INTRAMUSCULAR | Status: DC | PRN
  Administered 2012-02-25 (×3): 2 mg via INTRAVENOUS

## 2012-02-25 NOTE — Interval H&P Note (Signed)
History and Physical Interval Note:  02/25/2012 2:08 PM  [name]  has presented today for surgery, with the diagnosis of DYSPHAGIA, C-DIFF H/O, H/O PARTIAL SBO  The various methods of treatment have been discussed with the patient and family. After consideration of risks, benefits and other options for treatment, the patient has consented to  Procedure(s) (LRB) with comments: ESOPHAGOGASTRODUODENOSCOPY (EGD) WITH ESOPHAGEAL DILATION (N/A) - 2:15 as a surgical intervention .  The patient's history has been reviewed, patient examined, no change in status, stable for surgery.  I have reviewed the patient's chart and labs.  Questions were answered to the patient's satisfaction.     [name]  Apparently, C. difficile was, in fact, positive at [facility]. Again, patient treated. Diarrhea now only sparse and intermittent.

## 2012-02-25 NOTE — H&P (View-Only) (Signed)
Reviewed records from [facility] hospitalization dated 01/31/12 to 02/07/12. Patient presented with partial SBO treated conservatively. Positive C.Diff PCR. Recent augmentin for dental issues.   Labs showed: TSH 0.64, Tbili 0.7, AP 82, AST 16, ALT 10, alb 3.0, BUN 4, Creatinine 0.71, Na 135, Potassium 3.9, glucose 107, calcium 8.1, magnesium 1.7, WBC 8.4, H/H 12/37.2, MCV 91.3, Platelet 257,000

## 2012-02-25 NOTE — Interval H&P Note (Signed)
History and Physical Interval Note:  02/25/2012 2:03 PM  [name]  has presented today for surgery, with the diagnosis of DYSPHAGIA, C-DIFF H/O, H/O PARTIAL SBO  The various methods of treatment have been discussed with the patient and family. After consideration of risks, benefits and other options for treatment, the patient has consented to  Procedure(s) (LRB) with comments: ESOPHAGOGASTRODUODENOSCOPY (EGD) WITH ESOPHAGEAL DILATION (N/A) - 2:15 as a surgical intervention .  The patient's history has been reviewed, patient examined, no change in status, stable for surgery.  I have reviewed the patient's chart and labs.  Questions were answered to the patient's satisfaction.     [name]  Patient with recurrent esophageal dysphagia. Prior EGD with dilation (cervical web) with associated with resolution of dysphagia. Reflux symptoms well controlled on Dexilant 60 mg daily. Diarrhea only intermittent. Never with a document C. difficile infection-already. Repeat C. difficile toxin assay to doublecheck negative status. EGD with esophageal dilation now being performed per plan.The risks, benefits, limitations, alternatives and imponderables have been reviewed with the patient. Potential for esophageal dilation, biopsy, etc. have also been reviewed.  Questions have been answered. All parties agreeable.

## 2012-02-25 NOTE — Op Note (Signed)
[facility] [address] [place], [address]   ENDOSCOPY PROCEDURE REPORT  PATIENT: [name], [name]  MR#: [id number] BIRTHDATE: March 24, 1950 , 61  yrs. old GENDER: Male ENDOSCOPIST: R.  Roetta Sessions, MD FACP [facility] REFERRED BY:      [name], MD  [place], [place] PROCEDURE DATE:  02/25/2012 PROCEDURE:     EGD with Elease Hashimoto dilation followed by gastric biopsy  INDICATIONS:     Recurrent esophageal dysphagia. Dexilant working better than other agents for management of GERD  INFORMED CONSENT:   The risks, benefits, limitations, alternatives and imponderables have been discussed.  The potential for biopsy, esophogeal dilation, etc. have also been reviewed.  Questions have been answered.  All parties agreeable.  Please see the history and physical in the medical record for more information.  MEDICATIONS:   Demerol 150 mg IV and Versed 6 mg IV in divided doses. Cetacaine spray.  DESCRIPTION OF PROCEDURE:   The EG-[id number] ([id number])  endoscope was introduced through the mouth and advanced to the second portion of the duodenum without difficulty or limitations.  The mucosal surfaces were surveyed very carefully during advancement of the scope and upon withdrawal.  Retroflexion view of the proximal stomach and esophagogastric junction was performed.      FINDINGS: Normal appearing tubular esophagus aside from a non-critical appearing cervical esophageal web. Stomach empty. Small hiatal hernia. Somewhat nodular, inflamed-appearing nodular mucosa on gastric side of anastomosis. This did not appear to be an infiltrating process. Please see photos. 1efferent limb found.  The efferent limb was surveyed a good 15-20 cm. Mucosa in this segment of the GI tract appeared normal.  THERAPEUTIC / DIAGNOSTIC MANEUVERS PERFORMED:  A 56 French Maloney dilator was passed to full insertion easily. A look back revealed the cervical web and ruptured nicely without apparent  complication. There was minimal bleeding. Subsequently, biopsies of the nodular gastric mucosa at the anastomosis taken for histologic study.   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 Status post prior gastric surgery with Jeryl Columbia 1 configuration. Abnormal gastric mucosa at the anastomosis of doubtful clinical significance-status post biopsy  RECOMMENDATIONS:  Continue Dexilant 60 mg orally daily as this agent appears to be working the best for GERD symptoms. Followup on pathology. Further recommendations to follow.      _______________________________ R. Roetta Sessions, MD FACP [facility] eSigned:  R. Roetta Sessions, MD FACP [facility] 02/25/2012 3:32 PM     CC:  PATIENT NAME:  [name], [name] MR#: [id number]

## 2012-02-25 NOTE — H&P (View-Only) (Signed)
Primary Care Physician:  DAVIDSON,ERIC, MD  Primary Gastroenterologist:  Norville [name], MD   Chief Complaint  Patient presents with  . Dysphagia    HPI:  [name] is a 61 y.o. male here for further evaluation of dysphagia. Patient last seen in 04/2011 for urgent visit at request of his PCP for suspected recurrent SBO. KUB by PCP at that time showed mildly prominent bowel loop in left mid abd measuring up to 3.5cm with scattered minimal air fluid level. Also prominence of bowel wall in LUQ. Stool and air noted in rectum. STAT CT done here failed to show transition point.   Last EGD was 03/2011 by Dr. [name]-->excoriation and friability at [facility] (trauma or M-W equivalent), small hh, fribability of gastric mucosa at anastomosis, efferent limb intubated 10-15cm, subjective appeared dilated but no mucosa abnormality.   He presents today after another hospitalization for partial SBO at [facility] the first of October. States he was told he had C.Diff but was unable to have BMs for one week while in the hospital. Did not require NG tube. Lots of vomiting at that time. Since then, feels like something in throat, hard to swallow, lot of gas/bloating/abd pain. Washes pills down. Feels hungry, eats little, feels full and bloated, takes a long time to swallow.  No heartburn. Takes Dexilant daily but is in need of prior authorization and RX. Office has been working on this and will f/u on it again today.    No vomiting. BM QOD, watery, very little formed. No blood in stool. No melena.  Current Outpatient Prescriptions  Medication Sig Dispense Refill  . alendronate (FOSAMAX) 70 MG tablet Take 70 mg by mouth every 7 (seven) days. On sundays      . atorvastatin (LIPITOR) 20 MG tablet Take 20 mg by mouth at bedtime.       . cyanocobalamin (,VITAMIN B-12,) 1000 MCG/ML injection Inject 1,000 mcg into the muscle every 30 (thirty) days.      . cyclobenzaprine (FLEXERIL) 10 MG tablet Take 10 mg by mouth.        . dexlansoprazole (DEXILANT) 60 MG capsule Take 60 mg by mouth every morning.      . enoxaparin (LOVENOX) 60 MG/0.6ML injection Inject 60 mg into the skin daily.       . folic acid (FOLVITE) 1 MG tablet Take 1 mg by mouth every morning.       . Hydrocodone-Acetaminophen 7.5-300 MG TABS 7.5 mg as needed.      . LORazepam (ATIVAN) 1 MG tablet Take 1 mg by mouth every 3 (three) hours as needed. anxiety      . Nutritional Supplements (JUICE PLUS FIBRE) LIQD Take 1 Can by mouth daily.      . polyethylene glycol powder (GLYCOLAX/MIRALAX) powder Take 17 g by mouth as needed. For constipation      . prochlorperazine (COMPAZINE) 10 MG tablet Take 10 mg by mouth every 6 (six) hours as needed. Nausea and Vomiting      . sertraline (ZOLOFT) 100 MG tablet Take 100 mg by mouth at bedtime.      . sucralfate (CARAFATE) 1 GM/10ML suspension Take 10 mLs (1 g total) by mouth 4 (four) times daily.  420 mL  0  . thiamine 100 MG tablet Take 100 mg by mouth every morning.       . traMADol (ULTRAM) 50 MG tablet Take 1-2 tablets (50-100 mg total) by mouth every 4 (four) hours as needed. For pain  30   tablet  1  . valsartan-hydrochlorothiazide (DIOVAN-HCT) 80-12.5 MG per tablet Take 1 tablet by mouth every morning.         Allergies as of 02/22/2012  . (No Known Allergies)    Past Medical History  Diagnosis Date  . Pulmonary embolism 02/2010  . Hypertension   . Osteoporosis   . Arthritis   . TIA (transient ischemic attack) 10/11  . ETOH abuse     quit 03/2010  . 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 Personal history of PE (pulmonary embolism) 10/01/2010  . Blood transfusion   . S/P endoscopy September 28, 2010    erosive reflux esophagitis, Billroth I anatomy  . Shortness of breath   . Sleep apnea   . Recurrent upper respiratory infection (URI)   . Anxiety   . Pneumonia   . Anemia   . Ileus   . Chronic abdominal pain   . GERD (gastroesophageal reflux disease)   . Chest pain at rest   . Erosive esophagitis    . 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 Hernia repair     right inguinal  . Appendectomy 1980s  . Cholecystectomy 1980s  . Colon surgery May 2012    left hemicolectomy, colon cancer found at time of surgery for bowel obstruction  . Portacath placement   . Abdominal sugery     for bowel obstruction x 8, all in 1980s, except for one in 07/2010  . Esophagogastroduodenoscopy 09/28/2010  . Esophagogastroduodenoscopy 12/01/2010    Cervical web status post dilation, erosive esophagitis, B1 hemigastrectomy, inflamed anastomosis  . Colonoscopy 03/18/2011    anastomosis at 35cm. Several adenomatous polyps removed. Sigmoid diverticulosis. Next TCS 02/2013  . Esophagogastroduodenoscopy 04/16/2011    excoriation at [facility] c/w trauma/M-W tear, friable gastric anastomosis, dilation efferent limb  . 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 Hypertension Mother   . Arthritis Mother   . Pneumonia Mother   . Hypertension Father   . Heart attack Father     History   Social History  . Marital Status: Married    Spouse Name: N/A    Number of Children: 3  . Years of Education: N/A   Occupational History  .  [employer]   Social History Main Topics  . Smoking status: Current Every Day Smoker -- 0.5 packs/day for 40 years    Types: Cigarettes  . Smokeless tobacco: Not on file  . Alcohol Use: No     Comment: last drink was Apr 16 2010  . Drug Use: No  . Sexually Active: Yes   Other Topics Concern  . Not on file   Social History Narrative  . No narrative on file      ROS:  General: Negative for anorexia, fever, chills, fatigue, weakness. Weight down 10 pounds since 04/2011 ov. Eyes: Negative for vision changes.  ENT: Negative for hoarseness,  nasal congestion. CV: Negative for chest pain, angina, palpitations, dyspnea on  exertion, peripheral edema.  Respiratory: Negative for dyspnea at rest, dyspnea on exertion, cough, sputum, wheezing.  GI: See history of present illness. GU:  Negative for dysuria, hematuria, urinary incontinence, urinary frequency, nocturnal urination.  MS: Negative for joint pain, low back pain.  Derm: Negative for rash or itching.  Neuro: Negative for weakness, abnormal sensation, seizure,   frequent headaches, memory loss, confusion.  Psych: Negative for anxiety, depression, suicidal ideation, hallucinations.  Endo:weight down 10 pounds since 04/2011 OV. Heme: Negative for bruising or bleeding. Allergy: Negative for rash or hives.    Physical Examination:  BP 126/85  Pulse 102  Temp 98.3 F (36.8 C) (Temporal)  Ht 5' 8" (1.727 m)  Wt 135 lb 12.8 oz (61.598 kg)  BMI 20.65 kg/m2   General: Well-nourished, well-developed in no acute distress.  Head: Normocephalic, atraumatic.   Eyes: Conjunctiva pink, no icterus. Mouth: Oropharyngeal mucosa moist and pink , no lesions erythema or exudate. Neck: Supple without thyromegaly, masses, or lymphadenopathy.  Lungs: Clear to auscultation bilaterally.  Heart: Regular rate and rhythm, no murmurs rubs or gallops.  Abdomen: Bowel sounds are normal, nontender, nondistended, no hepatosplenomegaly or masses, no abdominal bruits or    hernia , no rebound or guarding.   Rectal: not performed Extremities: No lower extremity edema. No clubbing or deformities.  Neuro: Alert and oriented x 4 , grossly normal neurologically.  Skin: Warm and dry, no rash or jaundice.   Psych: Alert and cooperative, normal mood and affect.  Labs: Lab Results  Component Value Date   WBC 9.8 11/02/2011   HGB 12.6* 11/02/2011   HCT 38.7* 11/02/2011   MCV 89.8 11/02/2011   PLT 172 11/02/2011   Lab Results  Component Value Date   CREATININE 0.80 11/02/2011   BUN 3* 11/02/2011   NA 136 11/02/2011   K 4.5 11/02/2011   CL 101 11/02/2011   CO2 28 11/02/2011   Lab Results   Component Value Date   ALT 8 09/12/2011   AST 11 09/12/2011   ALKPHOS 136* 09/12/2011   BILITOT 0.4 09/12/2011   CEA pending.  Imaging Studies: No results found.    

## 2012-02-28 LAB — CLOSTRIDIUM DIFFICILE BY PCR: Toxigenic C. Difficile by PCR: NOT DETECTED

## 2012-02-29 NOTE — Progress Notes (Signed)
Quick Note:  Ciff pcr negative. EGD as planned. ______

## 2012-03-01 ENCOUNTER — Encounter (HOSPITAL_COMMUNITY): Payer: Self-pay | Admitting: Internal Medicine

## 2012-03-02 ENCOUNTER — Encounter: Payer: Self-pay | Admitting: Internal Medicine

## 2012-03-02 ENCOUNTER — Encounter: Payer: Self-pay | Admitting: *Deleted

## 2012-05-06 ENCOUNTER — Other Ambulatory Visit: Payer: Self-pay

## 2012-05-13 DIAGNOSIS — K56609 Unspecified intestinal obstruction, unspecified as to partial versus complete obstruction: Secondary | ICD-10-CM

## 2012-05-13 HISTORY — DX: Unspecified intestinal obstruction, unspecified as to partial versus complete obstruction: K56.609

## 2012-05-18 NOTE — Progress Notes (Signed)
REVIEWED.  

## 2012-05-19 ENCOUNTER — Encounter: Payer: Self-pay | Admitting: *Deleted

## 2012-05-22 ENCOUNTER — Other Ambulatory Visit (HOSPITAL_COMMUNITY): Payer: Federal, State, Local not specified - PPO

## 2012-05-25 ENCOUNTER — Ambulatory Visit (HOSPITAL_COMMUNITY): Payer: Federal, State, Local not specified - PPO | Admitting: Oncology

## 2012-05-28 NOTE — Progress Notes (Addendum)
DAVIDSON,ERIC, MD [address] [place] [address]  Adenocarcinoma of colon with mucinous features - Plan: CBC with Differential, Comprehensive metabolic panel, CT Abdomen Pelvis W Contrast  CURRENT THERAPY: Observation with every 3 months CEA  INTERVAL HISTORY: KLYE BESECKER 62 y.o. male returns for  regular  visit for followup of Stage III cancer of the colon, status post surgical resection, and postsurgical adjuvant chemotherapy for 7 doses of a planned 12 of FOLFOX (Starting in July 2012 and finishing on 01/04/2011).   In December 2013, Rimas underwent an EGD by Dr. Jena Gauss for dysphagia.  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  Biopsy of stomach shows mild, chronic inflammation without malignancy.  He is scheduled for a GI follow-up appointment on 06/14/12.  Oncologically, we will follow the NCCN guidelines for Stage III Colon Cancer.  We will perform CEA every 3 months x 2 years (through October 2014) and then every 6 months for 3 more years for a total CEA surveillance of 5 years.  Will also perform annual CT abd/pelvis for surveillance for 5 years total.   He is due for his next annual CT abd/pelvis but he recently had a CT in [facility] in Feb 2014.  As a result, we will ascertain that report.  He will need another CT abd/pelvis in Feb 2015.  I personally reviewed and went over laboratory results with the patient.  His CBC is unremarkable.  His CEA has been stable and WNL.   I tried to look at his CT scans in the exam room, but the computer would not allow me.  Oncologically, Albert denies any complaints and ROS questioning is negative.    Past Medical History  Diagnosis Date  . Pulmonary embolism 02/2010  . Hypertension   . Osteoporosis   . Arthritis   . TIA (transient ischemic attack) 10/11  . ETOH abuse     quit 03/2010  . 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 Personal history of PE (pulmonary  embolism) 10/01/2010  . Blood transfusion   . S/P endoscopy September 28, 2010    erosive reflux esophagitis, Billroth I anatomy  . Shortness of breath   . Sleep apnea   . Recurrent upper respiratory infection (URI)   . Anxiety   . Pneumonia   . Anemia   . Ileus   . Chronic abdominal pain   . GERD (gastroesophageal reflux disease)   . Chest pain at rest   . Erosive esophagitis   . 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 Vitamin B12 deficiency   . Bowel obstruction 05/13/2012  . Hx of Clostridium difficile infection 01/2012    has Adenocarcinoma of colon with mucinous features; Bleeding gastrointestinal; Supratherapeutic INR; Abdominal  pain, other specified site; Nausea and vomiting in adult; Anemia, chronic disease; Fever chills; S/P partial gastrectomy; Personal history of PE (pulmonary embolism); Bronchitis, acute; Erosive esophagitis; Esophageal reflux disease; Coffee ground emesis; Hematemesis; Chronic abdominal pain; Pancytopenia due to antineoplastic chemotherapy; Pneumonia; Pulmonary embolism; Chest pain at rest; Small bowel obstruction; Left sided abdominal pain; GI bleed; Coagulopathy; Gastroenteritis; Partial small bowel obstruction; Nausea; Abdominal distension; HTN (hypertension); Diarrhea; Cellulitis; Esophageal dysphagia; and H/O Clostridium difficile infection on his problem list.     has No Known Allergies.  Mr. Zimbelman does not currently have medications on file.  Past Surgical History  Procedure Laterality Date  . Hernia repair  right inguinal  . Appendectomy  1980s  . Cholecystectomy  1980s  . Colon surgery  May 2012    left hemicolectomy, colon cancer found at time of surgery for bowel obstruction  . Portacath placement    . Abdominal sugery      for bowel obstruction x 8, all in 1980s, except for one in 07/2010  . Esophagogastroduodenoscopy  09/28/2010  . Esophagogastroduodenoscopy  12/01/2010    Cervical web status post dilation,  erosive esophagitis, B1 hemigastrectomy, inflamed anastomosis  . Colonoscopy  03/18/2011    anastomosis at 35cm. Several adenomatous polyps removed. Sigmoid diverticulosis. Next TCS 02/2013  . Esophagogastroduodenoscopy  04/16/2011    excoriation at GEJ c/w trauma/M-W tear, friable gastric anastomosis, dilation efferent limb  . Billroth 1 hemigastrectomy  1980s    per patient for benign duodenal tumor  . Esophagogastroduodenoscopy (egd) with esophageal dilation  02/25/2012    Procedure: ESOPHAGOGASTRODUODENOSCOPY (EGD) WITH ESOPHAGEAL DILATION;  Surgeon: Corbin Ade, MD;  Location: [facility];  Service: Endoscopy;  Laterality: N/A;  2:15    Denies any headaches, dizziness, double vision, fevers, chills, night sweats, nausea, vomiting, diarrhea, constipation, chest pain, heart palpitations, shortness of breath, blood in stool, black tarry stool, urinary pain, urinary burning, urinary frequency, hematuria.   PHYSICAL EXAMINATION  ECOG PERFORMANCE STATUS: 1 - Symptomatic but completely ambulatory  Filed Vitals:   05/29/12 0959  BP: 124/75  Pulse: 93  Temp: 98.2 F (36.8 C)  Resp: 18    GENERAL:alert, no distress, comfortable, cooperative and smiling  SKIN: skin color, texture, turgor are normal, no rashes or significant lesions  HEAD: Normocephalic, No masses, lesions, tenderness or abnormalities  EYES: normal, Conjunctiva are pink and non-injected  EARS: External ears normal  OROPHARYNX:lips, buccal mucosa, and tongue normal, mucous membranes are moist and poor dentition with only a few teeth left.  NECK: supple, thyroid normal size, non-tender, without nodularity, no stridor, non-tender, moderate, benign, submandibular B/L, trachea midline  LYMPH: no palpable lymphadenopathy, no hepatosplenomegaly  BREAST:not examined  LUNGS: clear to auscultation and percussion  HEART: regular rate & rhythm, no murmurs, no gallops, S1 normal and S2 normal  ABDOMEN:abdomen soft, non-tender,  normal bowel sounds, no masses or organomegaly and no hepatosplenomegaly  BACK: Back symmetric, no curvature., No CVA tenderness  EXTREMITIES:less then 2 second capillary refill, no joint deformities, effusion, or inflammation, no edema, no skin discoloration, no clubbing, no cyanosis  NEURO: alert & oriented x 3 with fluent speech, no focal motor/sensory deficits, gait normal   LABORATORY DATA: CBC    Component Value Date/Time   WBC 8.5 05/29/2012 0940   RBC 4.93 05/29/2012 0940   HGB 14.4 05/29/2012 0940   HCT 43.1 05/29/2012 0940   PLT 247 05/29/2012 0940   MCV 87.4 05/29/2012 0940   MCH 29.2 05/29/2012 0940   MCHC 33.4 05/29/2012 0940   RDW 15.3 05/29/2012 0940   LYMPHSABS 2.6 05/29/2012 0940   MONOABS 1.0 05/29/2012 0940   EOSABS 0.2 05/29/2012 0940   BASOSABS 0.0 05/29/2012 0940      Chemistry      Component Value Date/Time   NA 136 11/02/2011 0511   K 4.5 11/02/2011 0511   CL 101 11/02/2011 0511   CO2 28 11/02/2011 0511   BUN 3* 11/02/2011 0511   CREATININE 0.80 11/02/2011 0511      Component Value Date/Time   CALCIUM 9.0 11/02/2011 0511   ALKPHOS 136* 09/12/2011 0625   AST 11 09/12/2011 0625   ALT 8 09/12/2011  1610   BILITOT 0.4 09/12/2011 9604     Lab Results  Component Value Date   CEA 2.4 02/22/2012      ASSESSMENT:  1. Stage III (T3N1M0) cancer of the colon, status post surgical resection, and postsurgical adjuvant chemotherapy for 7 doses of a planned 12 of FOLFOX (starting in mid-July 2012 and finishing on 01/04/2011).  Now followed with surveillance CEAs every 3 months 2. History of gastrointestinal bleeding.  3. History of multiple abdominal surgical procedures x 12 including a Billroth 1 hemigastrectomy.  4. Chronic pain.  5. History of excessive alcohol use in the past.  6. History of pulmonary embolus, S/P 6 months of anticoagulation.  7. History of probable Coumadin toxicity with an elevated INR in the a setting of a person who denied any Coumadin use but had a  positive Coumadin level, even though it was very unusual.  8. History of pneumonia.  9. History of a transient ischemic attack.  10.History of hypertension.  11.History of acid reflux disease.  12.Grade 1 peripheral neuropathy. 13. H/O SBO. 14. Multiple GI issues, followed by GI.   PLAN:  1. I personally reviewed and went over laboratory results with the patient. 2. Lab work today: CBC diff, CMET, CEA 3. I personally reviewed and went over radiographic studies with the patient. 4. Will continue to follow the NCCN guidelines and perform CEA every 3 months x 2 years and then every 6 months for 3 more years for a total CEA surveillance of 5 years.  Will also perform annual CT abd/pelvis for surveillance for 5 years total.  5. CT abd/pelvis performed at [facility] [place] [facility] in Feb 2014.  We will get that report. He will require a subsequent CT abd/pelvis next year in Feb 2015 for annual surveillance, per NCCN guidelines. 6. Continue CEA every 3 months through October 2014, and then every 6 months. 7. Lab work in 6 months: CBC diff, CMET, CEA 8. Return in 6 months for follow-up.   All questions were answered. The patient knows to call the clinic with any problems, questions or concerns. We can certainly see the patient much sooner if necessary.  Patient and plan will be discussed with Dr. Mariel Sleet within the next 24 hours.    KEFALAS,THOMAS   Addendum:  CT scan of abdomen and pelvis with contrast performed on 05/16/2012 at [facility] impression: 1. Postsurgical changes with relatively stable dilated area of stool filled proximal colon without gross evidence for obstruction at stool and air noted to the distal colon. 2. Minimal atherosclerotic disease with an abdominal aortic aneurysm.  CT scan of abdomen and pelvis with contrast performed on 04/18/2012 at [facility] impression: 1. Stable right renal cysts 2. Cholecystectomy 3.  Extensive upper abdominal GI tract surgery. 4. Mild gaseous distention of the colon and a few small bowel, similar to the prior study. No obvious obstruction.  KEFALAS,THOMAS

## 2012-05-29 ENCOUNTER — Encounter (HOSPITAL_COMMUNITY): Payer: Federal, State, Local not specified - PPO | Attending: Oncology | Admitting: Oncology

## 2012-05-29 ENCOUNTER — Encounter (HOSPITAL_BASED_OUTPATIENT_CLINIC_OR_DEPARTMENT_OTHER): Payer: Federal, State, Local not specified - PPO

## 2012-05-29 ENCOUNTER — Encounter (HOSPITAL_COMMUNITY): Payer: Self-pay | Admitting: Oncology

## 2012-05-29 VITALS — BP 124/75 | HR 93 | Temp 98.2°F | Resp 18 | Wt 143.2 lb

## 2012-05-29 DIAGNOSIS — C189 Malignant neoplasm of colon, unspecified: Secondary | ICD-10-CM

## 2012-05-29 DIAGNOSIS — Z85038 Personal history of other malignant neoplasm of large intestine: Secondary | ICD-10-CM

## 2012-05-29 LAB — COMPREHENSIVE METABOLIC PANEL
AST: 16 U/L (ref 0–37)
CO2: 28 mEq/L (ref 19–32)
Calcium: 9.2 mg/dL (ref 8.4–10.5)
Creatinine, Ser: 0.86 mg/dL (ref 0.50–1.35)
GFR calc Af Amer: >90 mL/min (ref >90)
GFR calc non Af Amer: >90 mL/min (ref >90)

## 2012-05-29 LAB — CBC WITH DIFFERENTIAL/PLATELET
Basophils Absolute: 0 10*3/uL (ref 0.0–0.1)
Eosinophils Relative: 3 % (ref 0–5)
HCT: 43.1 % (ref 39.0–52.0)
Lymphocytes Relative: 31 % (ref 12–46)
MCHC: 33.4 g/dL (ref 30.0–36.0)
MCV: 87.4 fL (ref 78.0–100.0)
Monocytes Absolute: 1 10*3/uL (ref 0.1–1.0)
RDW: 15.3 % (ref 11.5–15.5)
WBC: 8.5 10*3/uL (ref 4.0–10.5)

## 2012-05-29 MED ORDER — HEPARIN SOD (PORK) LOCK FLUSH 100 UNIT/ML IV SOLN
500.0000 [IU] | Freq: Once | INTRAVENOUS | Status: DC
  Filled 2012-05-29: qty 5

## 2012-05-29 MED ORDER — SODIUM CHLORIDE 0.9 % IJ SOLN
10.0000 mL | INTRAMUSCULAR | Status: DC | PRN
  Filled 2012-05-29: qty 10

## 2012-05-29 NOTE — Patient Instructions (Addendum)
[facility] Discharge Instructions  RECOMMENDATIONS MADE BY THE CONSULTANT AND ANY TEST RESULTS WILL BE SENT TO YOUR REFERRING PHYSICIAN.  You need to have your port flushed every 6 weeks. We will get your CT report from [facility]. We will schedule you for lab work here every 12 weeks (CEA level). Return to clinic in 6 months to see MD.  Thank you for choosing [facility] to provide your oncology and hematology care.  To afford each patient quality time with our providers, please arrive at least 15 minutes before your scheduled appointment time.  With your help, our goal is to use those 15 minutes to complete the necessary work-up to ensure our physicians have the information they need to help with your evaluation and healthcare recommendations.    Effective January 1st, 2014, we ask that you re-schedule your appointment with our physicians should you arrive 10 or more minutes late for your appointment.  We strive to give you quality time with our providers, and arriving late affects you and other patients whose appointments are after yours.    Again, thank you for choosing [facility]  Our hope is that these requests will decrease the amount of time that you wait before being seen by our physicians.       _____________________________________________________________  Should you have questions after your visit to [facility], please contact our office at [phone] between the hours of 8:30 a.m. and 5:00 p.m.  Voicemails left after 4:30 p.m. will not be returned until the following business day.  For prescription refill requests, have your pharmacy contact our office with your prescription refill request.

## 2012-05-29 NOTE — Progress Notes (Signed)
Labs drawn today for cbc/diff,cmp,cea 

## 2012-06-12 ENCOUNTER — Emergency Department (HOSPITAL_COMMUNITY): Admit: 2012-06-12 | Payer: Federal, State, Local not specified - PPO

## 2012-06-12 ENCOUNTER — Encounter: Payer: Self-pay | Admitting: Internal Medicine

## 2012-06-12 ENCOUNTER — Inpatient Hospital Stay (HOSPITAL_COMMUNITY)
Admission: EM | Admit: 2012-06-12 | Discharge: 2012-06-20 | DRG: 180 | Disposition: A | Discharge status: Home / Self Care | Payer: Federal, State, Local not specified - PPO | Attending: General Surgery | Admitting: General Surgery

## 2012-06-12 ENCOUNTER — Ambulatory Visit (HOSPITAL_COMMUNITY)
Admit: 2012-06-12 | Discharge: 2012-06-12 | Disposition: A | Discharge status: Home / Self Care | Payer: Federal, State, Local not specified - PPO | Attending: Emergency Medicine | Admitting: Emergency Medicine

## 2012-06-12 ENCOUNTER — Emergency Department (HOSPITAL_COMMUNITY): Payer: Federal, State, Local not specified - PPO

## 2012-06-12 ENCOUNTER — Ambulatory Visit (HOSPITAL_COMMUNITY): Admit: 2012-06-12 | Payer: Federal, State, Local not specified - PPO

## 2012-06-12 DIAGNOSIS — Z8249 Family history of ischemic heart disease and other diseases of the circulatory system: Secondary | ICD-10-CM

## 2012-06-12 DIAGNOSIS — Z8701 Personal history of pneumonia (recurrent): Secondary | ICD-10-CM

## 2012-06-12 DIAGNOSIS — K219 Gastro-esophageal reflux disease without esophagitis: Secondary | ICD-10-CM | POA: Diagnosis present

## 2012-06-12 DIAGNOSIS — G4730 Sleep apnea, unspecified: Secondary | ICD-10-CM | POA: Diagnosis present

## 2012-06-12 DIAGNOSIS — K565 Intestinal adhesions [bands], unspecified as to partial versus complete obstruction: Principal | ICD-10-CM | POA: Diagnosis present

## 2012-06-12 DIAGNOSIS — R52 Pain, unspecified: Secondary | ICD-10-CM

## 2012-06-12 DIAGNOSIS — F411 Generalized anxiety disorder: Secondary | ICD-10-CM | POA: Diagnosis present

## 2012-06-12 DIAGNOSIS — Z85038 Personal history of other malignant neoplasm of large intestine: Secondary | ICD-10-CM

## 2012-06-12 DIAGNOSIS — F17200 Nicotine dependence, unspecified, uncomplicated: Secondary | ICD-10-CM | POA: Diagnosis present

## 2012-06-12 DIAGNOSIS — Z903 Acquired absence of stomach [part of]: Secondary | ICD-10-CM

## 2012-06-12 DIAGNOSIS — K5900 Constipation, unspecified: Secondary | ICD-10-CM | POA: Diagnosis present

## 2012-06-12 DIAGNOSIS — Z833 Family history of diabetes mellitus: Secondary | ICD-10-CM

## 2012-06-12 DIAGNOSIS — I10 Essential (primary) hypertension: Secondary | ICD-10-CM | POA: Diagnosis present

## 2012-06-12 DIAGNOSIS — Z8673 Personal history of transient ischemic attack (TIA), and cerebral infarction without residual deficits: Secondary | ICD-10-CM

## 2012-06-12 DIAGNOSIS — Z86711 Personal history of pulmonary embolism: Secondary | ICD-10-CM

## 2012-06-12 DIAGNOSIS — M810 Age-related osteoporosis without current pathological fracture: Secondary | ICD-10-CM | POA: Diagnosis present

## 2012-06-12 DIAGNOSIS — K56609 Unspecified intestinal obstruction, unspecified as to partial versus complete obstruction: Secondary | ICD-10-CM

## 2012-06-12 DIAGNOSIS — Z79899 Other long term (current) drug therapy: Secondary | ICD-10-CM

## 2012-06-12 DIAGNOSIS — D649 Anemia, unspecified: Secondary | ICD-10-CM | POA: Diagnosis present

## 2012-06-12 DIAGNOSIS — R339 Retention of urine, unspecified: Secondary | ICD-10-CM | POA: Diagnosis present

## 2012-06-12 DIAGNOSIS — M129 Arthropathy, unspecified: Secondary | ICD-10-CM | POA: Diagnosis present

## 2012-06-12 DIAGNOSIS — E538 Deficiency of other specified B group vitamins: Secondary | ICD-10-CM | POA: Diagnosis present

## 2012-06-12 LAB — COMPREHENSIVE METABOLIC PANEL
ALT: 8 U/L (ref 0–53)
AST: 15 U/L (ref 0–37)
Albumin: 3.7 g/dL (ref 3.5–5.2)
Alkaline Phosphatase: 94 U/L (ref 39–117)
Glucose, Bld: 106 mg/dL — ABNORMAL HIGH (ref 70–99)
Potassium: 4 mEq/L (ref 3.5–5.1)
Sodium: 135 mEq/L (ref 135–145)
Total Protein: 6.7 g/dL (ref 6.0–8.3)

## 2012-06-12 LAB — CBC WITH DIFFERENTIAL/PLATELET
Basophils Absolute: 0 10*3/uL (ref 0.0–0.1)
Basophils Relative: 0 % (ref 0–1)
Eosinophils Relative: 0 % (ref 0–5)
HCT: 41.2 % (ref 39.0–52.0)
MCHC: 33.7 g/dL (ref 30.0–36.0)
MCV: 86.7 fL (ref 78.0–100.0)
Monocytes Absolute: 0.7 10*3/uL (ref 0.1–1.0)
Monocytes Relative: 11 % (ref 3–12)
RDW: 15.3 % (ref 11.5–15.5)

## 2012-06-12 LAB — LIPASE, BLOOD: Lipase: 17 U/L (ref 11–59)

## 2012-06-12 MED ORDER — IOHEXOL 300 MG/ML  SOLN
50.0000 mL | Freq: Once | INTRAMUSCULAR | Status: AC | PRN
  Administered 2012-06-12: 50 mL via ORAL

## 2012-06-12 MED ORDER — IOHEXOL 300 MG/ML  SOLN
100.0000 mL | Freq: Once | INTRAMUSCULAR | Status: AC | PRN
  Administered 2012-06-12: 100 mL via INTRAVENOUS

## 2012-06-12 MED ORDER — HYDROMORPHONE HCL PF 1 MG/ML IJ SOLN
1.0000 mg | Freq: Once | INTRAMUSCULAR | Status: AC
  Administered 2012-06-12: 1 mg via INTRAVENOUS
  Filled 2012-06-12: qty 1

## 2012-06-12 MED ORDER — DEXTROSE-NACL 5-0.45 % IV SOLN
INTRAVENOUS | Status: AC
  Administered 2012-06-13: 01:00:00 via INTRAVENOUS

## 2012-06-12 MED ORDER — HYDROMORPHONE HCL PF 1 MG/ML IJ SOLN
1.0000 mg | INTRAMUSCULAR | Status: DC | PRN
  Administered 2012-06-13 (×3): 1 mg via INTRAVENOUS
  Filled 2012-06-12 (×3): qty 1

## 2012-06-12 MED ORDER — ONDANSETRON HCL 4 MG/2ML IJ SOLN
4.0000 mg | Freq: Three times a day (TID) | INTRAMUSCULAR | Status: DC | PRN
  Administered 2012-06-12: 4 mg via INTRAVENOUS
  Filled 2012-06-12 (×2): qty 2

## 2012-06-12 MED ORDER — SODIUM CHLORIDE 0.9 % IV SOLN
INTRAVENOUS | Status: DC
  Administered 2012-06-12: 18:00:00 via INTRAVENOUS

## 2012-06-12 MED ORDER — IOHEXOL 300 MG/ML  SOLN: 50.0000 mL | Freq: Once | INTRAMUSCULAR | Status: AC | PRN

## 2012-06-12 MED ORDER — ONDANSETRON HCL 4 MG/2ML IJ SOLN
4.0000 mg | Freq: Once | INTRAMUSCULAR | Status: AC
  Administered 2012-06-12: 4 mg via INTRAVENOUS
  Filled 2012-06-12: qty 2

## 2012-06-12 MED ORDER — SODIUM CHLORIDE 0.9 % IV BOLUS (SEPSIS)
500.0000 mL | Freq: Once | INTRAVENOUS | Status: AC
  Administered 2012-06-12: 500 mL via INTRAVENOUS

## 2012-06-12 NOTE — ED Notes (Signed)
Pt back in room, co pain, requesting pain med

## 2012-06-12 NOTE — ED Notes (Signed)
Attempted to call report, nurse unavailable.

## 2012-06-12 NOTE — ED Provider Notes (Signed)
History    This chart was scribed for Shelda Jakes, MD by Charolett Bumpers, ED Scribe. The patient was seen in room APA03/APA03. Patient's care was started at 4:50 PM.   CSN: [id number]  Arrival date & time 06/12/12  1530   First MD Initiated Contact with Patient 06/12/12 1650      Chief Complaint  Patient presents with  . Abdominal Pain  . Melena    HPI Comments: [name] is a 62 y.o. male who presents to the Emergency Department complaining of constant, left sided periumbilical abdominal pain that started 2 days ago and has gradually worsened. He describes the pain as sharp and denies any radiation to his back. He reports associated nausea with 3 episodes of vomiting. He also reports a headache, light headedness, chills, diaphoresis and dark black stools. He denies any bloody stools. He reports a h/o similar abdominal pain and states that he has been hospitalized 4 times since 8/13 with his last admission in February at [facility]. He saw Dr. Girtha Rm two weeks ago, who discussed his CT results with him that showed a narrowing in the last part of his intestines. He states that he last saw Dr. Jena Gauss was in 12/13 and is scheduling a colonoscopy. He has a h/o colon cancer in 2012 and had a left hemicolectomy by Dr. Girtha Rm.   Internal Medicine: Dr. Ignacia Palma in [place]  Patient is a 62 y.o. male presenting with abdominal pain. The history is provided by the patient. No language interpreter was used.  Abdominal Pain Pain location:  LLQ and periumbilical Pain quality: sharp   Pain radiates to:  Does not radiate Timing:  Constant Progression:  Worsening Associated symptoms: chills, nausea, shortness of breath (2 weeks) and vomiting   Associated symptoms: no chest pain, no cough, no diarrhea and no fever     Past Medical History  Diagnosis Date  . Pulmonary embolism 02/2010  . Hypertension   . Osteoporosis   . Arthritis   . TIA (transient ischemic attack) 10/11  . ETOH  abuse     quit 03/2010  . 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 Personal history of PE (pulmonary embolism) 10/01/2010  . Blood transfusion   . S/P endoscopy September 28, 2010    erosive reflux esophagitis, Billroth I anatomy  . Shortness of breath   . Sleep apnea   . Recurrent upper respiratory infection (URI)   . Anxiety   . Pneumonia   . Anemia   . Ileus   . Chronic abdominal pain   . GERD (gastroesophageal reflux disease)   . Chest pain at rest   . Erosive esophagitis   . 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 Vitamin B12 deficiency   . Bowel obstruction 05/13/2012  . Hx of Clostridium difficile infection 01/2012    Past Surgical History  Procedure Laterality Date  . Hernia repair      right inguinal  . Appendectomy  1980s  . Cholecystectomy  1980s  . Colon surgery  May 2012    left hemicolectomy, colon cancer found at time of surgery for bowel obstruction  . Portacath placement    . Abdominal sugery      for bowel obstruction x 8, all in 1980s, except for one in 07/2010  . Esophagogastroduodenoscopy  09/28/2010  . Esophagogastroduodenoscopy  12/01/2010    Cervical web status post dilation, erosive esophagitis, B1 hemigastrectomy, inflamed anastomosis  . Colonoscopy  03/18/2011  anastomosis at 35cm. Several adenomatous polyps removed. Sigmoid diverticulosis. Next TCS 02/2013  . Esophagogastroduodenoscopy  04/16/2011    excoriation at [facility] c/w trauma/M-W tear, friable gastric anastomosis, dilation efferent limb  . Billroth 1 hemigastrectomy  1980s    per patient for benign duodenal tumor  . Esophagogastroduodenoscopy (egd) with esophageal dilation  02/25/2012    [id number] esophageal web-s/p dilation anddisruption as described above. Status post prior gastric surgery    Family History  Problem Relation Age of Onset  . Hypertension Mother   . Arthritis Mother   . Pneumonia Mother   . Hypertension Father   . Heart attack Father     History   Substance Use Topics  . Smoking status: Current Every Day Smoker -- 0.50 packs/day for 40 years    Types: Cigarettes  . Smokeless tobacco: Never Used  . Alcohol Use: No     Comment: last drink was Apr 16 2010      Review of Systems  Constitutional: Positive for chills and diaphoresis. Negative for fever.  HENT: Negative for congestion, rhinorrhea and neck pain.   Eyes: Negative for visual disturbance.  Respiratory: Positive for shortness of breath (2 weeks). Negative for cough.   Cardiovascular: Negative for chest pain and leg swelling.  Gastrointestinal: Positive for nausea, vomiting and abdominal pain. Negative for diarrhea and blood in stool.  Genitourinary: Positive for difficulty urinating.       Urinary retention.  Musculoskeletal: Negative for back pain.  Skin: Negative for rash.  Neurological: Positive for light-headedness and headaches.  Hematological: Does not bruise/bleed easily.  Psychiatric/Behavioral: Negative for confusion.  All other systems reviewed and are negative.    Allergies  Review of patient's allergies indicates no known allergies.  Home Medications   Current Outpatient Rx  Name  Route  Sig  Dispense  Refill  . alendronate (FOSAMAX) 70 MG tablet   Oral   Take 70 mg by mouth every 7 (seven) days. On sundays         . atorvastatin (LIPITOR) 20 MG tablet   Oral   Take 20 mg by mouth at bedtime.          . cholecalciferol (VITAMIN D) 1000 UNITS tablet   Oral   Take 1,000 Units by mouth every morning.         . cyanocobalamin (,VITAMIN B-12,) 1000 MCG/ML injection   Intramuscular   Inject 1,000 mcg into the muscle every 30 (thirty) days.         . cyclobenzaprine (FLEXERIL) 10 MG tablet   Oral   Take 10 mg by mouth daily as needed for muscle spasms. Muscle Spasms         . dexlansoprazole (DEXILANT) 60 MG capsule   Oral   Take 60 mg by mouth every morning.         . docusate sodium (COLACE) 100 MG capsule   Oral   Take 100  mg by mouth 2 (two) times daily.         [name] enoxaparin (LOVENOX) 60 MG/0.6ML injection   Subcutaneous   Inject 60 mg into the skin daily.          . ferrous sulfate 325 (65 FE) MG tablet   Oral   Take 325 mg by mouth 2 (two) times daily.         . folic acid (FOLVITE) 1 MG tablet   Oral   Take 1 mg by mouth every morning.          [name]  LORazepam (ATIVAN) 1 MG tablet   Oral   Take 1 mg by mouth every 3 (three) hours as needed. anxiety         . Nutritional Supplements (JUICE PLUS FIBRE) LIQD   Oral   Take 1 Can by mouth daily.         . ondansetron (ZOFRAN) 4 MG tablet   Oral   Take 4 mg by mouth daily as needed for nausea.         . polyethylene glycol powder (GLYCOLAX/MIRALAX) powder   Oral   Take 17 g by mouth as needed. For constipation         . prochlorperazine (COMPAZINE) 10 MG tablet   Oral   Take 10 mg by mouth every 6 (six) hours as needed. Nausea and Vomiting         . sertraline (ZOLOFT) 100 MG tablet   Oral   Take 100 mg by mouth at bedtime.         . sucralfate (CARAFATE) 1 GM/10ML suspension   Oral   Take 10 mLs (1 g total) by mouth 4 (four) times daily.   420 mL   0   . thiamine 100 MG tablet   Oral   Take 100 mg by mouth every morning.          . traMADol (ULTRAM) 50 MG tablet   Oral   Take 1-2 tablets (50-100 mg total) by mouth every 4 (four) hours as needed. For pain   30 tablet   1   . valsartan-hydrochlorothiazide (DIOVAN-HCT) 80-12.5 MG per tablet   Oral   Take 1 tablet by mouth every morning.          . Vitamin D, Ergocalciferol, (DRISDOL) 50000 UNITS CAPS   Oral   Take 500,000 Units by mouth once a week. Takes on Sunday           Triage Vitals: BP 130/98  Pulse 103  Temp(Src) 97.3 F (36.3 C) (Oral)  Resp 18  Ht 5\' 9"  (1.753 m)  Wt 140 lb (63.504 kg)  BMI 20.67 kg/m2  SpO2 100%  Physical Exam  Nursing note and vitals reviewed. Constitutional: He is oriented to person, place, and time. He appears  well-developed and well-nourished. No distress.  HENT:  Head: Normocephalic and atraumatic.  Eyes: Conjunctivae and EOM are normal. Pupils are equal, round, and reactive to light. No scleral icterus.  Neck: Normal range of motion. Neck supple. No tracheal deviation present.  Cardiovascular: Normal rate, regular rhythm and normal heart sounds.   No murmur heard. Pulmonary/Chest: Effort normal and breath sounds normal. No respiratory distress.  Port in left upper chest.   Abdominal: Soft. Bowel sounds are normal. He exhibits no distension. There is tenderness. There is no guarding.  Tender in LLQ, no guarding. Rest of abdomen is non-tender.   Musculoskeletal: Normal range of motion. He exhibits no edema.  No swelling in legs.   Neurological: He is alert and oriented to person, place, and time. No cranial nerve deficit.  Skin: Skin is warm and dry.  Psychiatric: He has a normal mood and affect. His behavior is normal.    ED Course  Procedures (including critical care time)  DIAGNOSTIC STUDIES: Oxygen Saturation is 100% on RA, normal by my interpretation.    COORDINATION OF CARE:  5:31 PM-Discussed planned course of treatment with the patient including IV fluids, pain and nausea medication, CT of abdomen, CMP, CBC and checking lipase, who is agreeable at  this time.   5:45 PM-Medication Orders: 0.9% sodium chloride infusion-continuous; Sodium chloride 0.9% bolus 500 mL-once; Ondansetron (Zofran) injection 4 mg-once; Hydromorphone (Dilaudid) injection 1 mg-once.   9:15 PM-Pt has now returned from [facility] after being transferred for CT scan. He is still complaining of pain, 1 mg of Dilaudid ordered.   9:38 PM-Recheck: Informed pt of CT results that show a small bowel obstruction. Will consult for Dr. Lovell Sheehan who is on call for Dr. Girtha Rm for admission. He is agreeable with plan.   9:42 PM-Consult complete with Dr. Lovell Sheehan, general surgery. Patient case explained and discussed. Dr. Lovell Sheehan  agrees to admit patient for further evaluation and treatment. Call ended at 9:44 PM    Results for orders placed during the hospital encounter of 06/12/12  LIPASE, BLOOD      Result Value Range   Lipase 17  11 - 59 U/L  COMPREHENSIVE METABOLIC PANEL      Result Value Range   Sodium 135  135 - 145 mEq/L   Potassium 4.0  3.5 - 5.1 mEq/L   Chloride 97  96 - 112 mEq/L   CO2 28  19 - 32 mEq/L   Glucose, Bld 106 (*) 70 - 99 mg/dL   BUN 10  6 - 23 mg/dL   Creatinine, Ser 4.09  0.50 - 1.35 mg/dL   Calcium 9.1  8.4 - 81.1 mg/dL   Total Protein 6.7  6.0 - 8.3 g/dL   Albumin 3.7  3.5 - 5.2 g/dL   AST 15  0 - 37 U/L   ALT 8  0 - 53 U/L   Alkaline Phosphatase 94  39 - 117 U/L   Total Bilirubin 0.2 (*) 0.3 - 1.2 mg/dL   GFR calc non Af Amer 78 (*) >90 mL/min   GFR calc Af Amer >90  >90 mL/min  CBC WITH DIFFERENTIAL      Result Value Range   WBC 6.8  4.0 - 10.5 K/uL   RBC 4.75  4.22 - 5.81 MIL/uL   Hemoglobin 13.9  13.0 - 17.0 g/dL   HCT 91.4  78.2 - 95.6 %   MCV 86.7  78.0 - 100.0 fL   MCH 29.3  26.0 - 34.0 pg   MCHC 33.7  30.0 - 36.0 g/dL   RDW 21.3  08.6 - 57.8 %   Platelets 208  150 - 400 K/uL   Neutrophils Relative 65  43 - 77 %   Neutro Abs 4.4  1.7 - 7.7 K/uL   Lymphocytes Relative 24  12 - 46 %   Lymphs Abs 1.6  0.7 - 4.0 K/uL   Monocytes Relative 11  3 - 12 %   Monocytes Absolute 0.7  0.1 - 1.0 K/uL   Eosinophils Relative 0  0 - 5 %   Eosinophils Absolute 0.0  0.0 - 0.7 K/uL   Basophils Relative 0  0 - 1 %   Basophils Absolute 0.0  0.0 - 0.1 K/uL    Ct Abdomen Pelvis W Contrast  06/12/2012  *RADIOLOGY REPORT*  Clinical Data: And vomiting.  Dark stools.  CT ABDOMEN AND PELVIS WITH CONTRAST  Technique:  Multidetector CT imaging of the abdomen and pelvis was performed following the standard protocol during bolus administration of intravenous contrast.  Contrast: OMNIPAQUE IOHEXOL 300 MG/ML  SOLN  Comparison: 09/10/2011  Findings: Stable hypodensities in the right kidney  and left lobe of the liver.  Chronic changes of the left kidney with scarring.  There is distention  of the colon and prominent stool burden in the ascending colon.  There are dilated loops of proximal small bowel and decompressed distal small bowel loops.  The transition point is at the midline towards the pelvis.  Spleen is within normal limits  Pancreas is atrophic.  Adrenal glands are within normal limits.  There are air bubbles in the subcutaneous fat of the left abdomen likely from a recent injection.  Bladder is mildly distended.  Prostate is within normal limits.  No free fluid.  Postoperative changes involving the stomach are stable.  No acute bony deformity.  IMPRESSION: Small bowel obstruction pattern.  The transition point is in the mid small bowel towards the pelvis at the midline.  Prominent stool burden in the ascending colon.  Stable chronic findings.   Original Report Authenticated By: Jolaine Click, M.D.      1. Pain   2. Small bowel obstruction       MDM    The patient's CT scan is consistent with small bowel obstruction. Discussed with on-call surgeon Dr. Lovell Sheehan he will admit temporary middle orders done. Patient nontoxic no acute distress. NG tube will be started on suction. Patient will be n.p.o. Patient's hemoglobin and hematocrit is stable no evidence of significant blood loss no leukocytosis. Patient's liver function test are normal lipase is also normal. Patient with past history of colon resection for colon cancer that was done by Dr. Leticia Penna. Known to have a narrowing of his anastomosis with his large intestine to the rectal area. However today were finding a small bowel obstruction.    I personally performed the services described in this documentation, which was scribed in my presence. The recorded information has been reviewed and is accurate.       Shelda Jakes, MD 06/12/12 2153

## 2012-06-12 NOTE — ED Notes (Signed)
Pt left facility for CT scan at other facility

## 2012-06-12 NOTE — ED Notes (Signed)
Left sided abd pain since yesterday with n/v and loose black stool.  Pt also c/o dizziness and lightheadedness.

## 2012-06-13 MED ORDER — ACETAMINOPHEN 650 MG RE SUPP: 650.0000 mg | Freq: Four times a day (QID) | RECTAL | Status: DC | PRN

## 2012-06-13 MED ORDER — PANTOPRAZOLE SODIUM 40 MG IV SOLR
40.0000 mg | Freq: Every day | INTRAVENOUS | Status: DC
  Administered 2012-06-13 – 2012-06-19 (×7): 40 mg via INTRAVENOUS
  Filled 2012-06-13 (×7): qty 40

## 2012-06-13 MED ORDER — HYDROMORPHONE HCL PF 1 MG/ML IJ SOLN
1.0000 mg | INTRAMUSCULAR | Status: DC | PRN
  Administered 2012-06-13 – 2012-06-20 (×56): 1 mg via INTRAVENOUS
  Filled 2012-06-13 (×57): qty 1

## 2012-06-13 MED ORDER — MILK AND MOLASSES ENEMA
Freq: Once | RECTAL | Status: AC
  Administered 2012-06-13: 18:00:00 via RECTAL

## 2012-06-13 MED ORDER — DIPHENHYDRAMINE HCL 12.5 MG/5ML PO ELIX: 12.5000 mg | ORAL_SOLUTION | Freq: Four times a day (QID) | ORAL | Status: DC | PRN

## 2012-06-13 MED ORDER — KCL IN DEXTROSE-NACL 20-5-0.45 MEQ/L-%-% IV SOLN
INTRAVENOUS | Status: DC
  Administered 2012-06-13 – 2012-06-19 (×12): via INTRAVENOUS

## 2012-06-13 MED ORDER — ONDANSETRON HCL 4 MG/2ML IJ SOLN
4.0000 mg | Freq: Four times a day (QID) | INTRAMUSCULAR | Status: DC | PRN
  Administered 2012-06-13 – 2012-06-20 (×21): 4 mg via INTRAVENOUS
  Filled 2012-06-13 (×21): qty 2

## 2012-06-13 MED ORDER — ACETAMINOPHEN 325 MG PO TABS: 650.0000 mg | ORAL_TABLET | Freq: Four times a day (QID) | ORAL | Status: DC | PRN

## 2012-06-13 MED ORDER — DIPHENHYDRAMINE HCL 50 MG/ML IJ SOLN: 12.5000 mg | Freq: Four times a day (QID) | INTRAMUSCULAR | Status: DC | PRN

## 2012-06-13 MED ORDER — ENOXAPARIN SODIUM 60 MG/0.6ML ~~LOC~~ SOLN
60.0000 mg | Freq: Every day | SUBCUTANEOUS | Status: DC
  Administered 2012-06-13 – 2012-06-20 (×8): 60 mg via SUBCUTANEOUS
  Filled 2012-06-13 (×9): qty 0.6

## 2012-06-13 MED ORDER — LORAZEPAM 2 MG/ML IJ SOLN
1.0000 mg | INTRAMUSCULAR | Status: DC | PRN
Start: 2012-06-13 — End: 2012-06-20

## 2012-06-13 NOTE — Care Management Note (Signed)
    Page 1 of 1   06/20/2012     1:59:46 PM   CARE MANAGEMENT NOTE 06/20/2012  Patient:  [name], [name]   Account Number:  [id number]  Date Initiated:  06/13/2012  Documentation initiated by:  Sharrie Rothman  Subjective/Objective Assessment:   Pt admitted from home with SBO. Pt lives with his wife and will return home at discharge. Pt is independent with ADL's.     Action/Plan:   No CM needs noted.   Anticipated DC Date:  06/17/2012   Anticipated DC Plan:  HOME/SELF CARE      DC Planning Services  CM consult      Choice offered to / List presented to:             Status of service:  Completed, signed off Medicare Important Message given?   (If response is "NO", the following Medicare IM given date fields will be blank) Date Medicare IM given:   Date Additional Medicare IM given:    Discharge Disposition:  HOME/SELF CARE  Per UR Regulation:    If discussed at Long Length of Stay Meetings, dates discussed:    Comments:  06/20/12 1400 Arlyss Queen, RN BSN CM Pt discharged home today. No furthur CM needs noted.  06/13/12 1355 Arlyss Queen, RN BSN CM

## 2012-06-13 NOTE — H&P (Signed)
[name] is an 62 y.o. male.   Chief Complaint: Nausea and vomiting HPI: Patient is a 62 year old white male who presents with nausea and vomiting. He has a history of partial small bowel obstruction constipation secondary to multiple surgeries in possible narrowing of the colonic anastomosis. He apparently was at [facility] in February with similar symptoms. He was supposed to followup with Dr. Leticia Penna in the office. He presented emergency room yesterday evening. He was found in CT scan the abdomen and pelvis to have a partial small bowel obstruction and except amount stool within the descending colon. His last bowel movement was 48 hours ago.  Past Medical History  Diagnosis Date  . Pulmonary embolism 02/2010  . Hypertension   . Osteoporosis   . Arthritis   . TIA (transient ischemic attack) 10/11  . ETOH abuse     quit 03/2010  . 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 Personal history of PE (pulmonary embolism) 10/01/2010  . Blood transfusion   . S/P endoscopy September 28, 2010    erosive reflux esophagitis, Billroth I anatomy  . Shortness of breath   . Sleep apnea   . Recurrent upper respiratory infection (URI)   . Anxiety   . Pneumonia   . Anemia   . Ileus   . Chronic abdominal pain   . GERD (gastroesophageal reflux disease)   . Chest pain at rest   . Erosive esophagitis   . 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 Vitamin B12 deficiency   . Bowel obstruction 05/13/2012  . Hx of Clostridium difficile infection 01/2012    Past Surgical History  Procedure Laterality Date  . Hernia repair      right inguinal  . Appendectomy  1980s  . Cholecystectomy  1980s  . Colon surgery  May 2012    left hemicolectomy, colon cancer found at time of surgery for bowel obstruction  . Portacath placement    . Abdominal sugery      for bowel obstruction x 8, all in 1980s, except for one in 07/2010  . Esophagogastroduodenoscopy  09/28/2010  .  Esophagogastroduodenoscopy  12/01/2010    Cervical web status post dilation, erosive esophagitis, B1 hemigastrectomy, inflamed anastomosis  . Colonoscopy  03/18/2011    anastomosis at 35cm. Several adenomatous polyps removed. Sigmoid diverticulosis. Next TCS 02/2013  . Esophagogastroduodenoscopy  04/16/2011    excoriation at GEJ c/w trauma/M-W tear, friable gastric anastomosis, dilation efferent limb  . Billroth 1 hemigastrectomy  1980s    per patient for benign duodenal tumor  . Esophagogastroduodenoscopy (egd) with esophageal dilation  02/25/2012    [id number] esophageal web-s/p dilation anddisruption as described above. Status post prior gastric surgery    Family History  Problem Relation Age of Onset  . Hypertension Mother   . Arthritis Mother   . Pneumonia Mother   . Hypertension Father   . Heart attack Father    Social History:  reports that he has been smoking Cigarettes.  He has a 20 pack-year smoking history. He has never used smokeless tobacco. He reports that he does not drink alcohol or use illicit drugs.  Allergies: No Known Allergies  Medications Prior to Admission  Medication Sig Dispense Refill  . alendronate (FOSAMAX) 70 MG tablet Take 70 mg by mouth every 7 (seven) days. On sundays      . atorvastatin (LIPITOR) 20 MG tablet Take 20 mg by mouth at bedtime.       [name]  cholecalciferol (VITAMIN D) 1000 UNITS tablet Take 1,000 Units by mouth every morning.      . cyanocobalamin (,VITAMIN B-12,) 1000 MCG/ML injection Inject 1,000 mcg into the muscle every 30 (thirty) days.      . cyclobenzaprine (FLEXERIL) 10 MG tablet Take 10 mg by mouth daily as needed for muscle spasms. Muscle Spasms      . dexlansoprazole (DEXILANT) 60 MG capsule Take 60 mg by mouth every morning.      . docusate sodium (COLACE) 100 MG capsule Take 100 mg by mouth 2 (two) times daily.      [name] enoxaparin (LOVENOX) 60 MG/0.6ML injection Inject 60 mg into the skin daily.       . ferrous sulfate 325 (65 FE)  MG tablet Take 325 mg by mouth 2 (two) times daily.      . folic acid (FOLVITE) 1 MG tablet Take 1 mg by mouth every morning.       [name] LORazepam (ATIVAN) 1 MG tablet Take 1 mg by mouth every 3 (three) hours as needed. anxiety      . Nutritional Supplements (JUICE PLUS FIBRE) LIQD Take 1 Can by mouth daily.      . ondansetron (ZOFRAN) 4 MG tablet Take 4 mg by mouth daily as needed for nausea.      . polyethylene glycol powder (GLYCOLAX/MIRALAX) powder Take 17 g by mouth as needed. For constipation      . prochlorperazine (COMPAZINE) 10 MG tablet Take 10 mg by mouth every 6 (six) hours as needed. Nausea and Vomiting      . sertraline (ZOLOFT) 100 MG tablet Take 100 mg by mouth at bedtime.      . sucralfate (CARAFATE) 1 GM/10ML suspension Take 10 mLs (1 g total) by mouth 4 (four) times daily.  420 mL  0  . thiamine 100 MG tablet Take 100 mg by mouth every morning.       . traMADol (ULTRAM) 50 MG tablet Take 1-2 tablets (50-100 mg total) by mouth every 4 (four) hours as needed. For pain  30 tablet  1  . valsartan-hydrochlorothiazide (DIOVAN-HCT) 80-12.5 MG per tablet Take 1 tablet by mouth every morning.       . Vitamin D, Ergocalciferol, (DRISDOL) 50000 UNITS CAPS Take 500,000 Units by mouth once a week. Takes on Sunday        Results for orders placed during the hospital encounter of 06/12/12 (from the past 48 hour(s))  LIPASE, BLOOD     Status: None   Collection Time    06/12/12  5:38 PM      Result Value Range   Lipase 17  11 - 59 U/L  COMPREHENSIVE METABOLIC PANEL     Status: Abnormal   Collection Time    06/12/12  5:38 PM      Result Value Range   Sodium 135  135 - 145 mEq/L   Potassium 4.0  3.5 - 5.1 mEq/L   Chloride 97  96 - 112 mEq/L   CO2 28  19 - 32 mEq/L   Glucose, Bld 106 (*) 70 - 99 mg/dL   BUN 10  6 - 23 mg/dL   Creatinine, Ser 1.47  0.50 - 1.35 mg/dL   Calcium 9.1  8.4 - 82.9 mg/dL   Total Protein 6.7  6.0 - 8.3 g/dL   Albumin 3.7  3.5 - 5.2 g/dL   AST 15  0 - 37 U/L    ALT 8  0 - 53 U/L  Alkaline Phosphatase 94  39 - 117 U/L   Total Bilirubin 0.2 (*) 0.3 - 1.2 mg/dL   GFR calc non Af Amer 78 (*) >90 mL/min   GFR calc Af Amer >90  >90 mL/min   Comment:            The eGFR has been calculated     using the CKD EPI equation.     This calculation has not been     validated in all clinical     situations.     eGFR's persistently     <90 mL/min signify     possible Chronic Kidney Disease.  CBC WITH DIFFERENTIAL     Status: None   Collection Time    06/12/12  5:38 PM      Result Value Range   WBC 6.8  4.0 - 10.5 K/uL   RBC 4.75  4.22 - 5.81 MIL/uL   Hemoglobin 13.9  13.0 - 17.0 g/dL   HCT 16.1  09.6 - 04.5 %   MCV 86.7  78.0 - 100.0 fL   MCH 29.3  26.0 - 34.0 pg   MCHC 33.7  30.0 - 36.0 g/dL   RDW 40.9  81.1 - 91.4 %   Platelets 208  150 - 400 K/uL   Neutrophils Relative 65  43 - 77 %   Neutro Abs 4.4  1.7 - 7.7 K/uL   Lymphocytes Relative 24  12 - 46 %   Lymphs Abs 1.6  0.7 - 4.0 K/uL   Monocytes Relative 11  3 - 12 %   Monocytes Absolute 0.7  0.1 - 1.0 K/uL   Eosinophils Relative 0  0 - 5 %   Eosinophils Absolute 0.0  0.0 - 0.7 K/uL   Basophils Relative 0  0 - 1 %   Basophils Absolute 0.0  0.0 - 0.1 K/uL   Ct Abdomen Pelvis W Contrast  06/12/2012  *RADIOLOGY REPORT*  Clinical Data: And vomiting.  Dark stools.  CT ABDOMEN AND PELVIS WITH CONTRAST  Technique:  Multidetector CT imaging of the abdomen and pelvis was performed following the standard protocol during bolus administration of intravenous contrast.  Contrast: OMNIPAQUE IOHEXOL 300 MG/ML  SOLN  Comparison: 09/10/2011  Findings: Stable hypodensities in the right kidney and left lobe of the liver.  Chronic changes of the left kidney with scarring.  There is distention of the colon and prominent stool burden in the ascending colon.  There are dilated loops of proximal small bowel and decompressed distal small bowel loops.  The transition point is at the midline towards the pelvis.   Spleen is within normal limits  Pancreas is atrophic.  Adrenal glands are within normal limits.  There are air bubbles in the subcutaneous fat of the left abdomen likely from a recent injection.  Bladder is mildly distended.  Prostate is within normal limits.  No free fluid.  Postoperative changes involving the stomach are stable.  No acute bony deformity.  IMPRESSION: Small bowel obstruction pattern.  The transition point is in the mid small bowel towards the pelvis at the midline.  Prominent stool burden in the ascending colon.  Stable chronic findings.   Original Report Authenticated By: Jolaine Click, M.D.     Review of Systems  Constitutional: Positive for malaise/fatigue. Negative for fever and chills.  HENT: Negative.   Eyes: Negative.   Respiratory: Negative.   Cardiovascular: Negative.   Gastrointestinal: Positive for nausea, vomiting, abdominal pain and constipation.  Genitourinary: Negative.  Musculoskeletal: Negative.   Skin: Negative.   Neurological: Negative.   Endo/Heme/Allergies: Negative.     Blood pressure 131/81, pulse 75, temperature 97.6 F (36.4 C), temperature source Oral, resp. rate 18, height 5\' 9"  (1.753 m), weight 63.1 kg (139 lb 1.8 oz), SpO2 96.00%. Physical Exam  Constitutional: He is oriented to person, place, and time. He appears well-developed and well-nourished.  HENT:  Head: Normocephalic and atraumatic.  Neck: Normal range of motion. Neck supple.  Cardiovascular: Normal rate, regular rhythm and normal heart sounds.   Respiratory: Effort normal and breath sounds normal.  GI: Soft. Bowel sounds are normal. He exhibits no distension. There is no tenderness. There is no rebound.  Neurological: He is alert and oriented to person, place, and time.  Skin: Skin is warm and dry.     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 We'll give enemas. No acute surgical intervention warranted at this time.  Nishika Parkhurst A 06/13/2012, 10:04 AM

## 2012-06-13 NOTE — Progress Notes (Signed)
[facility] Chart Review Completed  

## 2012-06-14 ENCOUNTER — Ambulatory Visit: Payer: Federal, State, Local not specified - PPO | Admitting: Gastroenterology

## 2012-06-14 LAB — BASIC METABOLIC PANEL
BUN: 5 mg/dL — ABNORMAL LOW (ref 6–23)
Calcium: 8.4 mg/dL (ref 8.4–10.5)
Creatinine, Ser: 0.86 mg/dL (ref 0.50–1.35)
GFR calc Af Amer: >90 mL/min (ref >90)
GFR calc non Af Amer: >90 mL/min (ref >90)
Glucose, Bld: 95 mg/dL (ref 70–99)
Potassium: 3.9 mEq/L (ref 3.5–5.1)

## 2012-06-14 LAB — CBC
HCT: 39.7 % (ref 39.0–52.0)
MCH: 29.2 pg (ref 26.0–34.0)
MCHC: 33 g/dL (ref 30.0–36.0)
MCV: 88.6 fL (ref 78.0–100.0)
Platelets: 195 10*3/uL (ref 150–400)
RDW: 15.5 % (ref 11.5–15.5)

## 2012-06-14 NOTE — Progress Notes (Signed)
  Subjective: No nausea. Mild tenderness. No fevers or chills.  Positive flatus.  Objective: Vital signs in last 24 hours: Temp:  [97.7 F (36.5 C)-98.2 F (36.8 C)] 98.2 F (36.8 C) (03/26 0512) Pulse Rate:  [74-78] 78 (03/26 0512) Resp:  [18] 18 (03/26 0512) BP: (118-128)/(62-82) 118/62 mmHg (03/26 0512) SpO2:  [98 %-99 %] 98 % (03/26 0512)    Intake/Output from previous day:   Intake/Output this shift:    General appearance: alert and no distress Resp: clear to auscultation bilaterally Cardio: regular rate and rhythm GI: Soft positive bowel sounds, flat, mild diffuse tenderness. No significant distention. No peritoneal signs  Lab Results:   Recent Labs  06/12/12 1738 06/14/12 0505  WBC 6.8 6.0  HGB 13.9 13.1  HCT 41.2 39.7  PLT 208 195   BMET  Recent Labs  06/12/12 1738 06/14/12 0505  NA 135 140  K 4.0 3.9  CL 97 106  CO2 28 28  GLUCOSE 106* 95  BUN 10 5*  CREATININE 1.01 0.86  CALCIUM 9.1 8.4   PT/INR No results found for this basename: LABPROT, INR,  in the last 72 hours ABG No results found for this basename: PHART, PCO2, PO2, HCO3,  in the last 72 hours  Studies/Results: Ct Abdomen Pelvis W Contrast  06/12/2012  *RADIOLOGY REPORT*  Clinical Data: And vomiting.  Dark stools.  CT ABDOMEN AND PELVIS WITH CONTRAST  Technique:  Multidetector CT imaging of the abdomen and pelvis was performed following the standard protocol during bolus administration of intravenous contrast.  Contrast: OMNIPAQUE IOHEXOL 300 MG/ML  SOLN  Comparison: 09/10/2011  Findings: Stable hypodensities in the right kidney and left lobe of the liver.  Chronic changes of the left kidney with scarring.  There is distention of the colon and prominent stool burden in the ascending colon.  There are dilated loops of proximal small bowel and decompressed distal small bowel loops.  The transition point is at the midline towards the pelvis.  Spleen is within normal limits  Pancreas is  atrophic.  Adrenal glands are within normal limits.  There are air bubbles in the subcutaneous fat of the left abdomen likely from a recent injection.  Bladder is mildly distended.  Prostate is within normal limits.  No free fluid.  Postoperative changes involving the stomach are stable.  No acute bony deformity.  IMPRESSION: Small bowel obstruction pattern.  The transition point is in the mid small bowel towards the pelvis at the midline.  Prominent stool burden in the ascending colon.  Stable chronic findings.   Original Report Authenticated By: Jolaine Click, M.D.     Anti-infectives: Anti-infectives   None      Assessment/Plan: s/p * No surgery found * Small bowel obstruction. Continue IV fluid hydration. Continue nasogastric decompression. Bowel function appears to be improving. Continue bowel rest for now. Ambulation as tolerated. Surgical options have been discussed. If patient demonstrates limited progression we will discuss possible exploration and suspected lysis of adhesions.  LOS: 2 days    Berline Semrad C 06/14/2012

## 2012-06-15 MED ORDER — SODIUM CHLORIDE 0.9 % IV BOLUS (SEPSIS)
1000.0000 mL | Freq: Once | INTRAVENOUS | Status: AC
  Administered 2012-06-15: 1000 mL via INTRAVENOUS

## 2012-06-16 ENCOUNTER — Inpatient Hospital Stay (HOSPITAL_COMMUNITY): Payer: Federal, State, Local not specified - PPO

## 2012-06-16 LAB — CBC
Hemoglobin: 13.3 g/dL (ref 13.0–17.0)
MCH: 29.5 pg (ref 26.0–34.0)
MCHC: 33.8 g/dL (ref 30.0–36.0)
RDW: 14.5 % (ref 11.5–15.5)

## 2012-06-16 LAB — BASIC METABOLIC PANEL
Calcium: 8.4 mg/dL (ref 8.4–10.5)
GFR calc Af Amer: >90 mL/min (ref >90)
GFR calc non Af Amer: >90 mL/min (ref >90)
Glucose, Bld: 127 mg/dL — ABNORMAL HIGH (ref 70–99)
Potassium: 4.4 mEq/L (ref 3.5–5.1)
Sodium: 139 mEq/L (ref 135–145)

## 2012-06-16 LAB — GLUCOSE, CAPILLARY: Glucose-Capillary: 91 mg/dL (ref 70–99)

## 2012-06-17 ENCOUNTER — Inpatient Hospital Stay (HOSPITAL_COMMUNITY): Payer: Federal, State, Local not specified - PPO

## 2012-06-17 LAB — BASIC METABOLIC PANEL
CO2: 27 mEq/L (ref 19–32)
Calcium: 8.5 mg/dL (ref 8.4–10.5)
GFR calc non Af Amer: >90 mL/min (ref >90)
Potassium: 4 mEq/L (ref 3.5–5.1)
Sodium: 137 mEq/L (ref 135–145)

## 2012-06-17 LAB — CBC
MCH: 29.6 pg (ref 26.0–34.0)
MCHC: 34.2 g/dL (ref 30.0–36.0)
Platelets: 198 10*3/uL (ref 150–400)
RBC: 4.7 MIL/uL (ref 4.22–5.81)

## 2012-06-17 MED ORDER — MILK AND MOLASSES ENEMA
Freq: Once | RECTAL | Status: AC
  Administered 2012-06-17: 11:00:00 via RECTAL

## 2012-06-17 MED ORDER — BISACODYL 10 MG RE SUPP
10.0000 mg | Freq: Once | RECTAL | Status: AC
  Administered 2012-06-17: 10 mg via RECTAL
  Filled 2012-06-17: qty 1

## 2012-06-17 NOTE — Progress Notes (Signed)
  Subjective: No significant change since yesterday. Continues to have flatus, but no significant bowel movement. NG tube output has decreased.  Objective: Vital signs in last 24 hours: Temp:  [97.8 F (36.6 C)-98.3 F (36.8 C)] 97.8 F (36.6 C) (03/29 0500) Pulse Rate:  [59-61] 59 (03/29 0500) Resp:  [20] 20 (03/29 0500) BP: (137-145)/(82-88) 145/82 mmHg (03/29 0500) SpO2:  [97 %] 97 % (03/29 0500) Last BM Date: 06/13/12  Intake/Output from previous day: 03/28 0701 - 03/29 0700 In: 1051.7 [I.V.:1051.7] Out: 1900 [Urine:900; Emesis/NG output:1000] Intake/Output this shift: Total I/O In: 1630 [I.V.:1630] Out: 400 [Urine:400]  General appearance: alert, cooperative and no distress GI: Soft, flat. No significant distention noted. No rigidity noted. Occasional bowel sounds appreciated.  Lab Results:   Recent Labs  06/16/12 0954 06/17/12 0945  WBC 6.0 6.3  HGB 13.3 13.9  HCT 39.4 40.7  PLT 183 198   BMET  Recent Labs  06/16/12 0954 06/17/12 0945  NA 139 137  K 4.4 4.0  CL 106 102  CO2 27 27  GLUCOSE 127* 102*  BUN 4* 4*  CREATININE 0.83 0.86  CALCIUM 8.4 8.5   PT/INR No results found for this basename: LABPROT, INR,  in the last 72 hours  Studies/Results: Dg Abd 1 View  06/16/2012  *RADIOLOGY REPORT*  Clinical Data: Persistent partial small bowel obstruction.  ABDOMEN - 1 VIEW  Comparison: CT abdomen pelvis 06/12/2012.  Findings: There is retained oral contrast in the cecum as well as ascending and proximal descending colon.  Transverse and descending colon are gas filled and dilated.  No small bowel dilatation. There are scattered surgical changes in the abdomen.  IMPRESSION: Retained oral contrast and gaseous distention of the colon is suggestive of a colonic ileus.  Difficult to exclude distal colonic obstruction.   Original Report Authenticated By: Leanna Battles, M.D.     Anti-infectives: Anti-infectives   None      Assessment/Plan: Impression:  Partial bowel obstruction. He does have contrast within the descending colon and descending colon regions. The proximal colon is somewhat dilated. He does have a history of an anastomotic narrowing. Plan: We'll try enemas again. It is encouraging that his NG tube output has decreased. Will recheck KUB in a.m.  LOS: 5 days    Britney Newstrom A 06/17/2012

## 2012-06-17 NOTE — Progress Notes (Signed)
Pt received milk and molasses enema. Pt states that he passed small stool abt 10 min after. Another enema given, waiting to see if it is effective at this time. [name]

## 2012-06-18 ENCOUNTER — Inpatient Hospital Stay (HOSPITAL_COMMUNITY): Payer: Federal, State, Local not specified - PPO

## 2012-06-18 LAB — BASIC METABOLIC PANEL
BUN: 5 mg/dL — ABNORMAL LOW (ref 6–23)
Creatinine, Ser: 0.9 mg/dL (ref 0.50–1.35)
GFR calc Af Amer: >90 mL/min (ref >90)
GFR calc non Af Amer: >90 mL/min (ref >90)

## 2012-06-18 LAB — CBC
Platelets: 191 10*3/uL (ref 150–400)
RBC: 4.66 MIL/uL (ref 4.22–5.81)
WBC: 7 10*3/uL (ref 4.0–10.5)

## 2012-06-18 MED ORDER — BISACODYL 10 MG RE SUPP
10.0000 mg | Freq: Once | RECTAL | Status: AC
  Administered 2012-06-18: 10 mg via RECTAL
  Filled 2012-06-18: qty 1

## 2012-06-18 NOTE — Progress Notes (Signed)
Pt had 2 watery BMs today, states that he does feel better today after having the BMs. [name]

## 2012-06-18 NOTE — Progress Notes (Signed)
  Subjective: Had significant bowel movement yesterday. No nausea or vomiting noted.  Objective: Vital signs in last 24 hours: Temp:  [97.6 F (36.4 C)-98.4 F (36.9 C)] 97.6 F (36.4 C) (03/30 0708) Pulse Rate:  [64-65] 64 (03/30 0708) Resp:  [20] 20 (03/30 0708) BP: (127-137)/(72-82) 127/72 mmHg (03/30 0708) SpO2:  [96 %-98 %] 97 % (03/30 0708) Last BM Date: 06/17/12  Intake/Output from previous day: 03/29 0701 - 03/30 0700 In: 2729.2 [P.O.:60; I.V.:2669.2] Out: 2500 [Urine:1500; Emesis/NG output:1000] Intake/Output this shift: Total I/O In: 0  Out: 500 [Urine:500]  General appearance: alert, cooperative and no distress GI: soft, non-tender; bowel sounds normal; no masses,  no organomegaly  Lab Results:   Recent Labs  06/17/12 0945 06/18/12 0915  WBC 6.3 7.0  HGB 13.9 13.8  HCT 40.7 40.4  PLT 198 191   BMET  Recent Labs  06/17/12 0945 06/18/12 0915  NA 137 139  K 4.0 4.1  CL 102 104  CO2 27 28  GLUCOSE 102* 96  BUN 4* 5*  CREATININE 0.86 0.90  CALCIUM 8.5 8.5   PT/INR No results found for this basename: LABPROT, INR,  in the last 72 hours  Studies/Results: Dg Abd 1 View  06/16/2012  *RADIOLOGY REPORT*  Clinical Data: Persistent partial small bowel obstruction.  ABDOMEN - 1 VIEW  Comparison: CT abdomen pelvis 06/12/2012.  Findings: There is retained oral contrast in the cecum as well as ascending and proximal descending colon.  Transverse and descending colon are gas filled and dilated.  No small bowel dilatation. There are scattered surgical changes in the abdomen.  IMPRESSION: Retained oral contrast and gaseous distention of the colon is suggestive of a colonic ileus.  Difficult to exclude distal colonic obstruction.   Original Report Authenticated By: Leanna Battles, M.D.     Anti-infectives: Anti-infectives   None      Assessment/Plan: Impression: Bowel obstruction, resolving. Plan: No need for acute surgical intervention. Will advance diet  as tolerated.  LOS: 6 days    Anysia Choi A 06/18/2012

## 2012-06-19 LAB — CBC
HCT: 40.4 % (ref 39.0–52.0)
Hemoglobin: 13.6 g/dL (ref 13.0–17.0)
WBC: 7.3 10*3/uL (ref 4.0–10.5)

## 2012-06-19 LAB — BASIC METABOLIC PANEL
BUN: 5 mg/dL — ABNORMAL LOW (ref 6–23)
Chloride: 106 mEq/L (ref 96–112)
Glucose, Bld: 94 mg/dL (ref 70–99)
Potassium: 4.2 mEq/L (ref 3.5–5.1)

## 2012-06-19 NOTE — Progress Notes (Signed)
  Subjective: Pain better.  Minimal nausea.  + BM.  Tolerating diet.  Objective: Vital signs in last 24 hours: Temp:  [98.1 F (36.7 C)-98.3 F (36.8 C)] 98.1 F (36.7 C) (03/31 0602) Pulse Rate:  [62-70] 62 (03/31 0602) Resp:  [16-20] 20 (03/31 0602) BP: (110-134)/(74-81) 110/74 mmHg (03/31 0602) SpO2:  [94 %-97 %] 97 % (03/31 0602) Weight:  [66.4 kg (146 lb 6.2 oz)] 66.4 kg (146 lb 6.2 oz) (03/31 0602) Last BM Date: 06/18/12  Intake/Output from previous day: 03/30 0701 - 03/31 0700 In: 1600 [P.O.:300; I.V.:1300] Out: 1300 [Urine:1300] Intake/Output this shift: Total I/O In: 380 [P.O.:380] Out: -   General appearance: alert and no distress GI: soft, non-tender; bowel sounds normal; no masses,  no organomegaly  Lab Results:   Recent Labs  06/18/12 0915 06/19/12 0452  WBC 7.0 7.3  HGB 13.8 13.6  HCT 40.4 40.4  PLT 191 202   BMET  Recent Labs  06/18/12 0915 06/19/12 0452  NA 139 140  K 4.1 4.2  CL 104 106  CO2 28 27  GLUCOSE 96 94  BUN 5* 5*  CREATININE 0.90 0.90  CALCIUM 8.5 8.4   PT/INR No results found for this basename: LABPROT, INR,  in the last 72 hours ABG No results found for this basename: PHART, PCO2, PO2, HCO3,  in the last 72 hours  Studies/Results: Dg Abd 1 View  06/18/2012  *RADIOLOGY REPORT*  Clinical Data: Bowel obstruction  ABDOMEN - 1 VIEW  Comparison: 06/16/2012  Findings: Residual oral contrast remains predominately in the right colon however contrast has migrated into the rectum distally. Slight increased distention of the mid abdominal small bowel loops. Persistent distention of the colon.  IMPRESSION: Appearance compatible with intermittent partial small bowel obstruction versus ileus.   Original Report Authenticated By: Judie Petit. Miles Costain, M.D.     Anti-infectives: Anti-infectives   None      Assessment/Plan: s/p * No surgery found * SBO:  Resolving.  Increase diet.  Once tolerates soft diet plans are to discharge.    LOS: 7 days     Caspar Favila C 06/19/2012

## 2012-06-20 LAB — BASIC METABOLIC PANEL
BUN: 6 mg/dL (ref 6–23)
Calcium: 8.4 mg/dL (ref 8.4–10.5)
GFR calc non Af Amer: 88 mL/min — ABNORMAL LOW (ref >90)
Glucose, Bld: 88 mg/dL (ref 70–99)
Potassium: 4 mEq/L (ref 3.5–5.1)

## 2012-06-20 LAB — CBC
HCT: 38.4 % — ABNORMAL LOW (ref 39.0–52.0)
Hemoglobin: 13 g/dL (ref 13.0–17.0)
MCH: 29.6 pg (ref 26.0–34.0)
MCHC: 33.9 g/dL (ref 30.0–36.0)

## 2012-06-20 MED ORDER — HEPARIN SOD (PORK) LOCK FLUSH 100 UNIT/ML IV SOLN
500.0000 [IU] | INTRAVENOUS | Status: AC | PRN
  Administered 2012-06-20: 500 [IU]
  Filled 2012-06-20: qty 5

## 2012-06-20 NOTE — Discharge Summary (Signed)
Physician Discharge Summary  [name] MRN: [id number] DOB/AGE: 23-Mar-1950 62 y.o.  Admit date: 06/12/2012 Discharge date: 06/20/2012  Admission Diagnoses:  Small Bowel Obstruction  Discharge Diagnoses:   Same Active Problems:   * No active hospital problems. *   Discharged Condition: stable  Hospital Course: Patient presented with nausea and vomiting.  Work-up consistent for SBO.  Patient with multiple prior abdominal surgeries. Admitted for conservative management.  Patient responded appropriately.  After patient's prior surgery, it is noted that he has an extremely hostile abdomen and benefits of surgical intervention should be reserved for only extreme reasons to return to the operating room.  As patient is aware of this, as well as he has been advanced on his diet; plans are to discharge and continue to monitor as outpatient.    Consults: None  Significant Diagnostic Studies: labs: daily  Treatments: IV hydration  Discharge Exam: Blood pressure 124/71, pulse 58, temperature 98.1 F (36.7 C), temperature source Oral, resp. rate 20, height 5\' 9"  (1.753 m), weight 66.4 kg (146 lb 6.2 oz), SpO2 97.00%. General appearance: alert, cooperative and no distress Resp: clear to auscultation bilaterally Cardio: regular rate and rhythm GI: soft, non-tender; bowel sounds normal; no masses,  no organomegaly  Disposition: 01-Home or Self Care  Discharge Orders   Future Appointments Provider Department Dept Phone   06/28/2012 11:30 AM [facility] [phone]   08/22/2012 9:30 AM [facility] [phone]   11/22/2012 9:30 AM [facility] [phone]   11/24/2012 11:30 AM Randall An, MD [facility] [phone]   Future Orders Complete By Expires     Diet - low sodium heart healthy  As directed     Discharge instructions  As directed     Comments:      Low residual diet.    Increase activity slowly  As directed         Medication List    STOP taking these medications       enoxaparin 60 MG/0.6ML injection  Commonly known as:  LOVENOX      TAKE these medications       alendronate 70 MG tablet  Commonly known as:  FOSAMAX  Take 70 mg by mouth every 7 (seven) days. On sundays     atorvastatin 20 MG tablet  Commonly known as:  LIPITOR  Take 20 mg by mouth at bedtime.     cholecalciferol 1000 UNITS tablet  Commonly known as:  VITAMIN D  Take 1,000 Units by mouth every morning.     cyanocobalamin 1000 MCG/ML injection  Commonly known as:  (VITAMIN B-12)  Inject 1,000 mcg into the muscle every 30 (thirty) days.     cyclobenzaprine 10 MG tablet  Commonly known as:  FLEXERIL  Take 10 mg by mouth daily as needed for muscle spasms. Muscle Spasms     dexlansoprazole 60 MG capsule  Commonly known as:  DEXILANT  Take 60 mg by mouth every morning.     docusate sodium 100 MG capsule  Commonly known as:  COLACE  Take 100 mg by mouth 2 (two) times daily.     ferrous sulfate 325 (65 FE) MG tablet  Take 325 mg by mouth 2 (two) times daily.     folic acid 1 MG tablet  Commonly known as:  FOLVITE  Take 1 mg by mouth every morning.     Juice Plus Fibre  Liqd  Take 1 Can by mouth daily.     LORazepam 1 MG tablet  Commonly known as:  ATIVAN  Take 1 mg by mouth every 3 (three) hours as needed. anxiety     ondansetron 4 MG tablet  Commonly known as:  ZOFRAN  Take 4 mg by mouth daily as needed for nausea.     polyethylene glycol powder powder  Commonly known as:  GLYCOLAX/MIRALAX  Take 17 g by mouth as needed. For constipation     prochlorperazine 10 MG tablet  Commonly known as:  COMPAZINE  Take 10 mg by mouth every 6 (six) hours as needed. Nausea and Vomiting     sertraline 100 MG tablet  Commonly known as:  ZOLOFT  Take 100 mg by mouth at bedtime.     sucralfate 1 GM/10ML suspension  Commonly known as:  CARAFATE  Take 10 mLs (1 g total)  by mouth 4 (four) times daily.     thiamine 100 MG tablet  Take 100 mg by mouth every morning.     traMADol 50 MG tablet  Commonly known as:  ULTRAM  Take 1-2 tablets (50-100 mg total) by mouth every 4 (four) hours as needed. For pain     valsartan-hydrochlorothiazide 80-12.5 MG per tablet  Commonly known as:  DIOVAN-HCT  Take 1 tablet by mouth every morning.     Vitamin D (Ergocalciferol) 50000 UNITS Caps  Commonly known as:  DRISDOL  Take 500,000 Units by mouth once a week. Takes on Sunday           Follow-up Information   Follow up with Kattleya Kuhnert C, MD In 2 weeks.   Contact information:   [address] [place] [address] [phone]       Signed: Fabio Bering 06/20/2012, 10:26 AM

## 2012-06-20 NOTE — Plan of Care (Signed)
Problem: Discharge Progression Outcomes Goal: Discharge plan in place and appropriate Outcome: Adequate for Discharge Plan to d/c home with family  Comments:  Pt given d/c instructions pt verbalized understanding of instructions

## 2012-06-21 ENCOUNTER — Encounter: Payer: Self-pay | Admitting: Oncology

## 2012-06-23 ENCOUNTER — Emergency Department (HOSPITAL_COMMUNITY): Payer: Federal, State, Local not specified - PPO

## 2012-06-23 ENCOUNTER — Emergency Department (HOSPITAL_COMMUNITY)
Admission: EM | Admit: 2012-06-23 | Discharge: 2012-06-23 | Disposition: A | Discharge status: Home / Self Care | Payer: Federal, State, Local not specified - PPO | Attending: Emergency Medicine | Admitting: Emergency Medicine

## 2012-06-23 ENCOUNTER — Encounter (HOSPITAL_COMMUNITY): Payer: Self-pay | Admitting: Emergency Medicine

## 2012-06-23 DIAGNOSIS — Z79899 Other long term (current) drug therapy: Secondary | ICD-10-CM | POA: Insufficient documentation

## 2012-06-23 DIAGNOSIS — Z8701 Personal history of pneumonia (recurrent): Secondary | ICD-10-CM | POA: Insufficient documentation

## 2012-06-23 DIAGNOSIS — D649 Anemia, unspecified: Secondary | ICD-10-CM | POA: Insufficient documentation

## 2012-06-23 DIAGNOSIS — Z9089 Acquired absence of other organs: Secondary | ICD-10-CM | POA: Insufficient documentation

## 2012-06-23 DIAGNOSIS — Z86711 Personal history of pulmonary embolism: Secondary | ICD-10-CM | POA: Insufficient documentation

## 2012-06-23 DIAGNOSIS — G8929 Other chronic pain: Secondary | ICD-10-CM | POA: Insufficient documentation

## 2012-06-23 DIAGNOSIS — R112 Nausea with vomiting, unspecified: Secondary | ICD-10-CM | POA: Insufficient documentation

## 2012-06-23 DIAGNOSIS — I10 Essential (primary) hypertension: Secondary | ICD-10-CM | POA: Insufficient documentation

## 2012-06-23 DIAGNOSIS — Z8719 Personal history of other diseases of the digestive system: Secondary | ICD-10-CM | POA: Insufficient documentation

## 2012-06-23 DIAGNOSIS — Z85038 Personal history of other malignant neoplasm of large intestine: Secondary | ICD-10-CM | POA: Insufficient documentation

## 2012-06-23 DIAGNOSIS — Z8709 Personal history of other diseases of the respiratory system: Secondary | ICD-10-CM | POA: Insufficient documentation

## 2012-06-23 DIAGNOSIS — Z8639 Personal history of other endocrine, nutritional and metabolic disease: Secondary | ICD-10-CM | POA: Insufficient documentation

## 2012-06-23 DIAGNOSIS — Z8739 Personal history of other diseases of the musculoskeletal system and connective tissue: Secondary | ICD-10-CM | POA: Insufficient documentation

## 2012-06-23 DIAGNOSIS — Z9889 Other specified postprocedural states: Secondary | ICD-10-CM | POA: Insufficient documentation

## 2012-06-23 DIAGNOSIS — R109 Unspecified abdominal pain: Secondary | ICD-10-CM

## 2012-06-23 DIAGNOSIS — K5900 Constipation, unspecified: Secondary | ICD-10-CM

## 2012-06-23 DIAGNOSIS — K219 Gastro-esophageal reflux disease without esophagitis: Secondary | ICD-10-CM | POA: Insufficient documentation

## 2012-06-23 DIAGNOSIS — G4730 Sleep apnea, unspecified: Secondary | ICD-10-CM | POA: Insufficient documentation

## 2012-06-23 DIAGNOSIS — Z8619 Personal history of other infectious and parasitic diseases: Secondary | ICD-10-CM | POA: Insufficient documentation

## 2012-06-23 DIAGNOSIS — F411 Generalized anxiety disorder: Secondary | ICD-10-CM | POA: Insufficient documentation

## 2012-06-23 DIAGNOSIS — Z8673 Personal history of transient ischemic attack (TIA), and cerebral infarction without residual deficits: Secondary | ICD-10-CM | POA: Insufficient documentation

## 2012-06-23 DIAGNOSIS — F17200 Nicotine dependence, unspecified, uncomplicated: Secondary | ICD-10-CM | POA: Insufficient documentation

## 2012-06-23 MED ORDER — PEG 3350-KCL-NA BICARB-NACL 420 G PO SOLR
4000.0000 mL | Freq: Once | ORAL | Status: AC
  Administered 2012-06-23: 4000 mL via ORAL
  Filled 2012-06-23: qty 4000

## 2012-06-23 MED ORDER — HYDROMORPHONE HCL PF 1 MG/ML IJ SOLN
1.0000 mg | Freq: Once | INTRAMUSCULAR | Status: AC
  Administered 2012-06-23: 1 mg via INTRAVENOUS
  Filled 2012-06-23: qty 1

## 2012-06-23 MED ORDER — HEPARIN SOD (PORK) LOCK FLUSH 100 UNIT/ML IV SOLN
INTRAVENOUS | Status: AC
  Administered 2012-06-23: 500 [IU]
  Filled 2012-06-23: qty 5

## 2012-06-23 MED ORDER — PEG 3350-KCL-NA BICARB-NACL 420 G PO SOLR: 4000.0000 mL | Freq: Once | ORAL | Status: DC

## 2012-06-23 MED ORDER — ONDANSETRON HCL 4 MG/2ML IJ SOLN
4.0000 mg | Freq: Once | INTRAMUSCULAR | Status: AC
  Administered 2012-06-23: 4 mg via INTRAVENOUS
  Filled 2012-06-23: qty 2

## 2012-06-23 MED ORDER — SODIUM CHLORIDE 0.9 % IV SOLN
Freq: Once | INTRAVENOUS | Status: AC
  Administered 2012-06-23: 02:00:00 via INTRAVENOUS

## 2012-06-23 NOTE — ED Notes (Signed)
Patient does not have a ride home. Patient requested to stay in room until time has passed after narcotic administration and patient is able to drive home. Advised patient he could not leave until 0730. Patient verbalized understanding. Patient requested to dress and walk outside. Advised patient he could get dressed but could not go outside due to narcotic administration. Patient verbalized understanding. Patient currently sitting in room watching TV.

## 2012-06-23 NOTE — ED Notes (Signed)
Patient c/o abdominal pain, nausea and vomiting x 2.

## 2012-06-23 NOTE — ED Notes (Signed)
Patient complaining of returning pain. Advised MD.

## 2012-06-23 NOTE — ED Provider Notes (Signed)
History     CSN: [id number]  Arrival date & time 06/23/12  0031   First MD Initiated Contact with Patient 06/23/12 0133      Chief Complaint  Patient presents with  . Abdominal Pain  . Emesis    (Consider location/radiation/quality/duration/timing/severity/associated sxs/prior treatment) HPI [name] is a 62 y.o. male with a h/o colon cancer (2012) , HTN, TIA, s/p partial gastrectomy brought in by ambulance, who presents to the Emergency Department complaining of left sided lower abdominal pain that began today. He was discharged from the hospital on Tuesday after admission for possible SBO. There is a CT done two weeks ago that showed narrowing in the lower part of his intestines.  This was reviewed with Dr. Suzette Battiest two weeks ago. Pain is sharp intermittent pain. He has not had a BM since Tuesday, usually has one daily.   PCP Dr. Ignacia Palma, [facility] GI Dr. Jena Gauss General Surgeon Dr. Leticia Penna Past Medical History  Diagnosis Date  . Pulmonary embolism 02/2010  . Hypertension   . Osteoporosis   . Arthritis   . TIA (transient ischemic attack) 10/11  . ETOH abuse     quit 03/2010  . 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 Personal history of PE (pulmonary embolism) 10/01/2010  . Blood transfusion   . S/P endoscopy September 28, 2010    erosive reflux esophagitis, Billroth I anatomy  . Shortness of breath   . Sleep apnea   . Recurrent upper respiratory infection (URI)   . Anxiety   . Pneumonia   . Anemia   . Ileus   . Chronic abdominal pain   . GERD (gastroesophageal reflux disease)   . Chest pain at rest   . Erosive esophagitis   . 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 Vitamin B12 deficiency   . Bowel obstruction 05/13/2012  . Hx of Clostridium difficile infection 01/2012    Past Surgical History  Procedure Laterality Date  . Hernia repair      right inguinal  . Appendectomy  1980s  . Cholecystectomy  1980s  . Colon surgery  May 2012    left  hemicolectomy, colon cancer found at time of surgery for bowel obstruction  . Portacath placement    . Abdominal sugery      for bowel obstruction x 8, all in 1980s, except for one in 07/2010  . Esophagogastroduodenoscopy  09/28/2010  . Esophagogastroduodenoscopy  12/01/2010    Cervical web status post dilation, erosive esophagitis, B1 hemigastrectomy, inflamed anastomosis  . Colonoscopy  03/18/2011    anastomosis at 35cm. Several adenomatous polyps removed. Sigmoid diverticulosis. Next TCS 02/2013  . Esophagogastroduodenoscopy  04/16/2011    excoriation at GEJ c/w trauma/M-W tear, friable gastric anastomosis, dilation efferent limb  . Billroth 1 hemigastrectomy  1980s    per patient for benign duodenal tumor  . Esophagogastroduodenoscopy (egd) with esophageal dilation  02/25/2012    MVH:QIONGEXB esophageal web-s/p dilation anddisruption as described above. Status post prior gastric surgery    Family History  Problem Relation Age of Onset  . Hypertension Mother   . Arthritis Mother   . Pneumonia Mother   . Hypertension Father   . Heart attack Father     History  Substance Use Topics  . Smoking status: Current Every Day Smoker -- 0.50 packs/day for 40 years    Types: Cigarettes  . Smokeless tobacco: Never Used  . Alcohol Use: No     Comment: last drink  was Apr 16 2010      Review of Systems  Constitutional: Negative for fever.       10 Systems reviewed and are negative for acute change except as noted in the HPI.  HENT: Negative for congestion.   Eyes: Negative for discharge and redness.  Respiratory: Negative for cough and shortness of breath.        Port a cath in left chest  Cardiovascular: Negative for chest pain.  Gastrointestinal: Negative for vomiting and abdominal pain.  Musculoskeletal: Negative for back pain.  Skin: Negative for rash.  Neurological: Negative for syncope, numbness and headaches.  Psychiatric/Behavioral:       No behavior change.    Allergies   Review of patient's allergies indicates no known allergies.  Home Medications   Current Outpatient Rx  Name  Route  Sig  Dispense  Refill  . alendronate (FOSAMAX) 70 MG tablet   Oral   Take 70 mg by mouth every 7 (seven) days. On sundays         . atorvastatin (LIPITOR) 20 MG tablet   Oral   Take 20 mg by mouth at bedtime.          . cholecalciferol (VITAMIN D) 1000 UNITS tablet   Oral   Take 1,000 Units by mouth every morning.         . cyanocobalamin (,VITAMIN B-12,) 1000 MCG/ML injection   Intramuscular   Inject 1,000 mcg into the muscle every 30 (thirty) days.         . cyclobenzaprine (FLEXERIL) 10 MG tablet   Oral   Take 10 mg by mouth daily as needed for muscle spasms. Muscle Spasms         . dexlansoprazole (DEXILANT) 60 MG capsule   Oral   Take 60 mg by mouth every morning.         . docusate sodium (COLACE) 100 MG capsule   Oral   Take 100 mg by mouth 2 (two) times daily.         . ferrous sulfate 325 (65 FE) MG tablet   Oral   Take 325 mg by mouth 2 (two) times daily.         . folic acid (FOLVITE) 1 MG tablet   Oral   Take 1 mg by mouth every morning.          [name] LORazepam (ATIVAN) 1 MG tablet   Oral   Take 1 mg by mouth every 3 (three) hours as needed. anxiety         . Nutritional Supplements (JUICE PLUS FIBRE) LIQD   Oral   Take 1 Can by mouth daily.         . ondansetron (ZOFRAN) 4 MG tablet   Oral   Take 4 mg by mouth daily as needed for nausea.         . polyethylene glycol powder (GLYCOLAX/MIRALAX) powder   Oral   Take 17 g by mouth as needed. For constipation         . prochlorperazine (COMPAZINE) 10 MG tablet   Oral   Take 10 mg by mouth every 6 (six) hours as needed. Nausea and Vomiting         . sertraline (ZOLOFT) 100 MG tablet   Oral   Take 100 mg by mouth at bedtime.         . sucralfate (CARAFATE) 1 GM/10ML suspension   Oral   Take 10 mLs (1 g total) by  mouth 4 (four) times daily.   420 mL    0   . thiamine 100 MG tablet   Oral   Take 100 mg by mouth every morning.          . traMADol (ULTRAM) 50 MG tablet   Oral   Take 1-2 tablets (50-100 mg total) by mouth every 4 (four) hours as needed. For pain   30 tablet   1   . valsartan-hydrochlorothiazide (DIOVAN-HCT) 80-12.5 MG per tablet   Oral   Take 1 tablet by mouth every morning.          . Vitamin D, Ergocalciferol, (DRISDOL) 50000 UNITS CAPS   Oral   Take 500,000 Units by mouth once a week. Takes on Sunday           BP 149/84  Pulse 70  Temp(Src) 98.1 F (36.7 C) (Oral)  Resp 18  Ht 5\' 9" (1.753 m)  Wt 140 lb (63.504 kg)  BMI 20.67 kg/m2  SpO2 99%  Physical Exam  Nursing note and vitals reviewed. Constitutional: He appears well-developed and well-nourished.  Awake, alert, nontoxic appearance.  HENT:  Head: Normocephalic and atraumatic.  Right Ear: External ear normal.  Mouth/Throat: Oropharynx is clear and moist.  Eyes: EOM are normal. Pupils are equal, round, and reactive to light.  Neck: Neck supple.  Cardiovascular: Normal rate and intact distal pulses.   Pulmonary/Chest: Effort normal and breath sounds normal. He exhibits no tenderness.  Port a cath  Abdominal: Soft. There is tenderness. There is no rebound.  Mild lower abdominal tenderness with palpation  Musculoskeletal: He exhibits no tenderness.  Baseline ROM, no obvious new focal weakness.  Neurological:  Mental status and motor strength appears baseline for patient and situation.  Skin: No rash noted.  Psychiatric: He has a normal mood and affect.    ED Course  Procedures (including critical care time)  Dg Abd Acute W/chest  06/23/2012  *RADIOLOGY REPORT*  Clinical Data: Abdominal pain, nausea, and vomiting for 1 day. History of colon cancer with multiple surgeries.  ACUTE ABDOMEN SERIES (ABDOMEN 2 VIEW & CHEST 1 VIEW)  Comparison: Abdomen 06/18/2012 and 06/16/2012.  Chest 07/21/2011.  Findings: Pulmonary hyperinflation suggesting  emphysema.  Power port type central venous catheter with tip over the upper SVC.  No change in position.  No focal airspace consolidation in the lungs. No blunting of costophrenic angles.  No pneumothorax.  Surgical clips in the upper abdomen.  Gas and stool in the colon. Distension of gas filled transverse colon and mid abdominal small bowel loops.  Changes are nonspecific but could be due to ileus, obstruction, or pseudo obstruction due to constipation.  Findings are similar to most recent previous abdominal study. No free intra- abdominal air.  No abnormal air fluid levels.  No radiopaque stones.  Mild degenerative changes in the spine.  IMPRESSION: No evidence of active pulmonary disease.  Stool filled colon with gas distension of transverse colon and mid abdominal small bowel. Changes are nonspecific and could be due to obstruction or ileus. No significant change since prior study.   Original Report Authenticated By: William Stevens, M.D.       03 45 Patient still in some pain after first does of dilaudid. Will given him a second dose. 0500 Pain resolved. He is drinking PO fluids.   MDM  Patient with lower abdominal pain and constipation. There is no significant difference in the xray c/w the one done while he was in the hospital. He has  received 2 doses of dilaudid with relief of his pain and zofran. He is pain free. Will send him home with a jog of Golytely to use as a laxative.Pt stable in ED with no significant deterioration in condition.The patient appears reasonably screened and/or stabilized for discharge and I doubt any other medical condition or other [facility] requiring further screening, evaluation, or treatment in the ED at this time prior to discharge.  MDM Reviewed: nursing note and vitals           Nicoletta Dress. Colon Branch, MD 06/23/12 1610

## 2012-06-24 ENCOUNTER — Emergency Department (HOSPITAL_COMMUNITY)
Admission: EM | Admit: 2012-06-24 | Discharge: 2012-06-25 | Disposition: A | Discharge status: Home / Self Care | Payer: Federal, State, Local not specified - PPO | Attending: Emergency Medicine | Admitting: Emergency Medicine

## 2012-06-24 ENCOUNTER — Encounter (HOSPITAL_COMMUNITY): Payer: Self-pay | Admitting: *Deleted

## 2012-06-24 DIAGNOSIS — R110 Nausea: Secondary | ICD-10-CM | POA: Insufficient documentation

## 2012-06-24 DIAGNOSIS — E538 Deficiency of other specified B group vitamins: Secondary | ICD-10-CM | POA: Insufficient documentation

## 2012-06-24 DIAGNOSIS — Z8701 Personal history of pneumonia (recurrent): Secondary | ICD-10-CM | POA: Insufficient documentation

## 2012-06-24 DIAGNOSIS — Z8673 Personal history of transient ischemic attack (TIA), and cerebral infarction without residual deficits: Secondary | ICD-10-CM | POA: Insufficient documentation

## 2012-06-24 DIAGNOSIS — Z9889 Other specified postprocedural states: Secondary | ICD-10-CM | POA: Insufficient documentation

## 2012-06-24 DIAGNOSIS — M810 Age-related osteoporosis without current pathological fracture: Secondary | ICD-10-CM | POA: Insufficient documentation

## 2012-06-24 DIAGNOSIS — Z8709 Personal history of other diseases of the respiratory system: Secondary | ICD-10-CM | POA: Insufficient documentation

## 2012-06-24 DIAGNOSIS — Z85038 Personal history of other malignant neoplasm of large intestine: Secondary | ICD-10-CM | POA: Insufficient documentation

## 2012-06-24 DIAGNOSIS — Z79899 Other long term (current) drug therapy: Secondary | ICD-10-CM | POA: Insufficient documentation

## 2012-06-24 DIAGNOSIS — R109 Unspecified abdominal pain: Secondary | ICD-10-CM

## 2012-06-24 DIAGNOSIS — D649 Anemia, unspecified: Secondary | ICD-10-CM | POA: Insufficient documentation

## 2012-06-24 DIAGNOSIS — Z86711 Personal history of pulmonary embolism: Secondary | ICD-10-CM | POA: Insufficient documentation

## 2012-06-24 DIAGNOSIS — Z9089 Acquired absence of other organs: Secondary | ICD-10-CM | POA: Insufficient documentation

## 2012-06-24 DIAGNOSIS — R1032 Left lower quadrant pain: Secondary | ICD-10-CM | POA: Insufficient documentation

## 2012-06-24 DIAGNOSIS — F17200 Nicotine dependence, unspecified, uncomplicated: Secondary | ICD-10-CM | POA: Insufficient documentation

## 2012-06-24 DIAGNOSIS — G4730 Sleep apnea, unspecified: Secondary | ICD-10-CM | POA: Insufficient documentation

## 2012-06-24 DIAGNOSIS — M129 Arthropathy, unspecified: Secondary | ICD-10-CM | POA: Insufficient documentation

## 2012-06-24 DIAGNOSIS — K219 Gastro-esophageal reflux disease without esophagitis: Secondary | ICD-10-CM | POA: Insufficient documentation

## 2012-06-24 DIAGNOSIS — Z8719 Personal history of other diseases of the digestive system: Secondary | ICD-10-CM | POA: Insufficient documentation

## 2012-06-24 DIAGNOSIS — I10 Essential (primary) hypertension: Secondary | ICD-10-CM | POA: Insufficient documentation

## 2012-06-24 DIAGNOSIS — F411 Generalized anxiety disorder: Secondary | ICD-10-CM | POA: Insufficient documentation

## 2012-06-24 DIAGNOSIS — Z8619 Personal history of other infectious and parasitic diseases: Secondary | ICD-10-CM | POA: Insufficient documentation

## 2012-06-24 DIAGNOSIS — G8929 Other chronic pain: Secondary | ICD-10-CM | POA: Insufficient documentation

## 2012-06-24 MED ORDER — BISACODYL 10 MG RE SUPP
10.0000 mg | Freq: Once | RECTAL | Status: AC
  Administered 2012-06-25: 10 mg via RECTAL
  Filled 2012-06-24: qty 1

## 2012-06-24 MED ORDER — SODIUM CHLORIDE 0.9 % IV BOLUS (SEPSIS)
1000.0000 mL | Freq: Once | INTRAVENOUS | Status: AC
  Administered 2012-06-25: 1000 mL via INTRAVENOUS

## 2012-06-24 MED ORDER — SODIUM CHLORIDE 0.9 % IV SOLN
INTRAVENOUS | Status: DC
  Administered 2012-06-25: 02:00:00 via INTRAVENOUS

## 2012-06-24 NOTE — ED Provider Notes (Signed)
History  This chart was scribed for [name] Melter, MD by [name], ED Scribe. This patient was seen in room APA08/APA08 and the patient's care was started at 11:06 PM.  CSN: [id number]  Arrival date & time 06/24/12  2206   First MD Initiated Contact with Patient 06/24/12 2306      Chief Complaint  Patient presents with  . Abdominal Pain  . Nausea    The history is provided by the patient. No language interpreter was used.    [name] is a 62 y.o. male with a h/o colon CA (2012) who presents to the Emergency Department complaining of LLQ abdominal pain described as sharp with associated nausea that he woke up with today. He reports that he has tolerated fluids and food with no complications today. He has a h/o "12 abdominal surgeries" performed by Dr. Caesar Bookman for "ulcers, adhesions and scar tissue". Last surgery was 2 years ago. He reports that his last CT scan was two weeks ago and was reviewed by Dr. Caesar Bookman. He reports having 6 CT scans performed in the past 3 years. He was discharged from the hospital on 06/20/12 after admission for possible SBO. He was seen here yesterday and told that he was constipated. He states that he was discharged with Golytely and states that he has had 2 BMs since starting the medication, one around 2 PM and one in the ED. He describes both BMs as watery.  He reports prior episodes of similar symptoms but cannot distinguish between constipation or SBO.  He states that he is currently on tramadol, folic acid, and HTN medications currently. He denies fever, sore throat, visual disturbance, CP, cough, SOB, emesis, diarrhea, urinary symptoms, back pain, HA, weakness, numbness and rash as associated symptoms. He also has a h/o PE, HTN and GERD. He is a current everyday smoker but denies alcohol use.   PCP Dr. Ignacia Palma is in [place]  Past Medical History  Diagnosis Date  . Pulmonary embolism 02/2010  . Hypertension   . Osteoporosis   . Arthritis    . TIA (transient ischemic attack) 10/11  . ETOH abuse     quit 03/2010  . 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 Personal history of PE (pulmonary embolism) 10/01/2010  . Blood transfusion   . S/P endoscopy September 28, 2010    erosive reflux esophagitis, Billroth I anatomy  . Shortness of breath   . Sleep apnea   . Recurrent upper respiratory infection (URI)   . Anxiety   . Pneumonia   . Anemia   . Ileus   . Chronic abdominal pain   . GERD (gastroesophageal reflux disease)   . Chest pain at rest   . Erosive esophagitis   . 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 Vitamin B12 deficiency   . Bowel obstruction 05/13/2012  . Hx of Clostridium difficile infection 01/2012    Past Surgical History  Procedure Laterality Date  . Hernia repair      right inguinal  . Appendectomy  1980s  . Cholecystectomy  1980s  . Colon surgery  May 2012    left hemicolectomy, colon cancer found at time of surgery for bowel obstruction  . Portacath placement    . Abdominal sugery      for bowel obstruction x 8, all in 1980s, except for one in 07/2010  . Esophagogastroduodenoscopy  09/28/2010  . Esophagogastroduodenoscopy  12/01/2010    Cervical web status post  dilation, erosive esophagitis, B1 hemigastrectomy, inflamed anastomosis  . Colonoscopy  03/18/2011    anastomosis at 35cm. Several adenomatous polyps removed. Sigmoid diverticulosis. Next TCS 02/2013  . Esophagogastroduodenoscopy  04/16/2011    excoriation at [facility] c/w trauma/M-W tear, friable gastric anastomosis, dilation efferent limb  . Billroth 1 hemigastrectomy  1980s    per patient for benign duodenal tumor  . Esophagogastroduodenoscopy (egd) with esophageal dilation  02/25/2012    [id number] esophageal web-s/p dilation anddisruption as described above. Status post prior gastric surgery    Family History  Problem Relation Age of Onset  . Hypertension Mother   . Arthritis Mother   . Pneumonia Mother   . Hypertension  Father   . Heart attack Father     History  Substance Use Topics  . Smoking status: Current Every Day Smoker -- 0.50 packs/day for 40 years    Types: Cigarettes  . Smokeless tobacco: Never Used  . Alcohol Use: No     Comment: last drink was Apr 16 2010      Review of Systems  A complete 10 system review of systems was obtained and all systems are negative except as noted in the HPI and PMH.   Allergies  Review of patient's allergies indicates no known allergies.  Home Medications   Current Outpatient Rx  Name  Route  Sig  Dispense  Refill  . alendronate (FOSAMAX) 70 MG tablet   Oral   Take 70 mg by mouth every 7 (seven) days. On sundays         . atorvastatin (LIPITOR) 20 MG tablet   Oral   Take 20 mg by mouth at bedtime.          . cholecalciferol (VITAMIN D) 1000 UNITS tablet   Oral   Take 1,000 Units by mouth every morning.         . cyanocobalamin (,VITAMIN B-12,) 1000 MCG/ML injection   Intramuscular   Inject 1,000 mcg into the muscle every 30 (thirty) days.         . cyclobenzaprine (FLEXERIL) 10 MG tablet   Oral   Take 10 mg by mouth daily as needed for muscle spasms. Muscle Spasms         . dexlansoprazole (DEXILANT) 60 MG capsule   Oral   Take 60 mg by mouth every morning.         . docusate sodium (COLACE) 100 MG capsule   Oral   Take 100 mg by mouth 2 (two) times daily.         . ferrous sulfate 325 (65 FE) MG tablet   Oral   Take 325 mg by mouth 2 (two) times daily.         . folic acid (FOLVITE) 1 MG tablet   Oral   Take 1 mg by mouth every morning.          [name] HYDROcodone-acetaminophen (NORCO) 5-325 MG per tablet   Oral   Take 1 tablet by mouth every 4 (four) hours as needed for pain.   15 tablet   0   . LORazepam (ATIVAN) 1 MG tablet   Oral   Take 1 mg by mouth every 3 (three) hours as needed. anxiety         . Nutritional Supplements (JUICE PLUS FIBRE) LIQD   Oral   Take 1 Can by mouth daily.         .  ondansetron (ZOFRAN) 4 MG tablet   Oral   Take  4 mg by mouth daily as needed for nausea.         . polyethylene glycol powder (GLYCOLAX/MIRALAX) powder   Oral   Take 17 g by mouth as needed. For constipation         . prochlorperazine (COMPAZINE) 10 MG tablet   Oral   Take 10 mg by mouth every 6 (six) hours as needed. Nausea and Vomiting         . sertraline (ZOLOFT) 100 MG tablet   Oral   Take 100 mg by mouth at bedtime.         . sucralfate (CARAFATE) 1 GM/10ML suspension   Oral   Take 10 mLs (1 g total) by mouth 4 (four) times daily.   420 mL   0   . thiamine 100 MG tablet   Oral   Take 100 mg by mouth every morning.          . traMADol (ULTRAM) 50 MG tablet   Oral   Take 1-2 tablets (50-100 mg total) by mouth every 4 (four) hours as needed. For pain   30 tablet   1   . valsartan-hydrochlorothiazide (DIOVAN-HCT) 80-12.5 MG per tablet   Oral   Take 1 tablet by mouth every morning.          . Vitamin D, Ergocalciferol, (DRISDOL) 50000 UNITS CAPS   Oral   Take 500,000 Units by mouth once a week. Takes on Sunday           Triage Vitals: BP 131/89  Pulse 95  Temp(Src) 97.4 F (36.3 C) (Oral)  Resp 20  Ht 5\' 9"  (1.753 m)  Wt 140 lb (63.504 kg)  BMI 20.67 kg/m2  SpO2 100%  Physical Exam  Nursing note and vitals reviewed. Constitutional: He is oriented to person, place, and time. He appears well-developed and well-nourished.  HENT:  Head: Normocephalic and atraumatic.  Right Ear: External ear normal.  Left Ear: External ear normal.  Eyes: Conjunctivae and EOM are normal. Pupils are equal, round, and reactive to light.  Neck: Normal range of motion and phonation normal. Neck supple.  Cardiovascular: Normal rate, regular rhythm, normal heart sounds and intact distal pulses.   Pulmonary/Chest: Effort normal and breath sounds normal. He exhibits no bony tenderness.  Abdominal: Soft. Normal appearance. He exhibits no distension. There is tenderness  (LLQ tenderness).  Hyperactive bowel sounds  Genitourinary:  Small amount of brown stool, no gross blood present  Musculoskeletal: Normal range of motion.  Neurological: He is alert and oriented to person, place, and time. He has normal strength. No cranial nerve deficit or sensory deficit. He exhibits normal muscle tone. Coordination normal.  Skin: Skin is warm, dry and intact.  Psychiatric: His behavior is normal. Judgment and thought content normal. His mood appears anxious (very).    ED Course  Procedures (including critical care time)  Medications  0.9 %  sodium chloride infusion ( Intravenous New Bag/Given 06/25/12 0212)  heparin lock flush 100 UNIT/ML injection (not administered)  bisacodyl (DULCOLAX) suppository 10 mg (10 mg Rectal Given 06/25/12 0009)  sodium chloride 0.9 % bolus 1,000 mL (0 mLs Intravenous Stopped 06/25/12 0212)  ondansetron (ZOFRAN) injection 4 mg (4 mg Intravenous Given 06/25/12 0220)   Patient Vitals for the past 24 hrs:  BP Temp Temp src Pulse Resp SpO2 Height Weight  06/25/12 0316 134/86 mmHg - - 80 20 98 % - -  06/24/12 2224 131/89 mmHg 97.4 F (36.3 C) Oral 95 20 100 %  5\' 9"  (1.753 m) 140 lb (63.504 kg)     DIAGNOSTIC STUDIES: Oxygen Saturation is 100% on room air, normal by my interpretation.    COORDINATION OF CARE: 11:17 PM-Discussed treatment plan which includes, IV Zofran, (CXR, CBC panel, UA) with pt at bedside and pt agreed to plan.    Reevaluation- 03:20- No vomiting or stooling in ED. No further c/o. Nontoxic appearance  Patient Vitals for the past 24 hrs:  BP Temp Temp src Pulse Resp SpO2 Height Weight  06/25/12 0316 134/86 mmHg - - 80 20 98 % - -  06/24/12 2224 131/89 mmHg 97.4 F (36.3 C) Oral 95 20 100 % 5\' 9"  (1.753 m) 140 lb (63.504 kg)    Labs Reviewed  URINALYSIS, ROUTINE W REFLEX MICROSCOPIC - Abnormal; Notable for the following:    Specific Gravity, Urine >1.030 (*)    Bilirubin Urine SMALL (*)    Ketones, ur TRACE (*)     All other components within normal limits  COMPREHENSIVE METABOLIC PANEL - Abnormal; Notable for the following:    Total Bilirubin 0.2 (*)    All other components within normal limits  CBC WITH DIFFERENTIAL   Nursing Notes Reviewed/ Care Coordinated Applicable Imaging Reviewed Interpretation of Laboratory Data incorporated into ED treatment   1. Abdominal pain       MDM  Recurrent abdominal pain with reassuring ED evaluation. Doubt obstruction, since not vomiting and no abdominal distension. Also, he has stooled in the last 24 hours, after taking GoLytely. Doubt metabolic instability, serious bacterial infection or impending vascular collapse; the patient is stable for discharge.   Plan: Home Medications- Norco, #15; Home Treatments- rest; Recommended follow up- GI f/u asap   I personally performed the services described in this documentation, which was scribed in my presence. The recorded information has been reviewed and is accurate.     [name] Melter, MD 06/25/12 0330

## 2012-06-24 NOTE — ED Notes (Addendum)
Pt reports having abdominal pain and nausea all day. Pt also reports watery stools.

## 2012-06-25 LAB — COMPREHENSIVE METABOLIC PANEL
ALT: 11 U/L (ref 0–53)
Albumin: 3.7 g/dL (ref 3.5–5.2)
Alkaline Phosphatase: 98 U/L (ref 39–117)
BUN: 9 mg/dL (ref 6–23)
Potassium: 3.7 mEq/L (ref 3.5–5.1)
Sodium: 140 mEq/L (ref 135–145)
Total Protein: 6.4 g/dL (ref 6.0–8.3)

## 2012-06-25 LAB — URINALYSIS, ROUTINE W REFLEX MICROSCOPIC
Leukocytes, UA: NEGATIVE
Nitrite: NEGATIVE
Specific Gravity, Urine: >1.03 — ABNORMAL HIGH (ref 1.005–1.030)
Urobilinogen, UA: 0.2 mg/dL (ref 0.0–1.0)

## 2012-06-25 LAB — CBC WITH DIFFERENTIAL/PLATELET
Basophils Absolute: 0 10*3/uL (ref 0.0–0.1)
Basophils Relative: 0 % (ref 0–1)
MCHC: 34.1 g/dL (ref 30.0–36.0)
Neutro Abs: 4.1 10*3/uL (ref 1.7–7.7)
Neutrophils Relative %: 52 % (ref 43–77)
RDW: 15.4 % (ref 11.5–15.5)

## 2012-06-25 LAB — OCCULT BLOOD, POC DEVICE: Fecal Occult Bld: NEGATIVE

## 2012-06-25 MED ORDER — HEPARIN SOD (PORK) LOCK FLUSH 100 UNIT/ML IV SOLN
INTRAVENOUS | Status: AC
  Administered 2012-06-25: 500 [IU]
  Filled 2012-06-25: qty 5

## 2012-06-25 MED ORDER — HYDROCODONE-ACETAMINOPHEN 5-325 MG PO TABS: 1.0000 | ORAL_TABLET | ORAL | Status: DC | PRN

## 2012-06-25 MED ORDER — ONDANSETRON HCL 4 MG/2ML IJ SOLN
4.0000 mg | Freq: Once | INTRAMUSCULAR | Status: AC
  Administered 2012-06-25: 4 mg via INTRAVENOUS
  Filled 2012-06-25: qty 2

## 2012-06-28 ENCOUNTER — Encounter (HOSPITAL_COMMUNITY): Payer: Self-pay

## 2012-06-28 ENCOUNTER — Encounter (HOSPITAL_COMMUNITY): Payer: Federal, State, Local not specified - PPO | Attending: Oncology

## 2012-06-28 ENCOUNTER — Emergency Department (HOSPITAL_COMMUNITY)
Admission: EM | Admit: 2012-06-28 | Discharge: 2012-06-28 | Disposition: A | Discharge status: Home / Self Care | Payer: Federal, State, Local not specified - PPO | Attending: Emergency Medicine | Admitting: Emergency Medicine

## 2012-06-28 ENCOUNTER — Emergency Department (HOSPITAL_COMMUNITY): Payer: Federal, State, Local not specified - PPO

## 2012-06-28 DIAGNOSIS — Z8739 Personal history of other diseases of the musculoskeletal system and connective tissue: Secondary | ICD-10-CM | POA: Insufficient documentation

## 2012-06-28 DIAGNOSIS — Z452 Encounter for adjustment and management of vascular access device: Secondary | ICD-10-CM

## 2012-06-28 DIAGNOSIS — Z9889 Other specified postprocedural states: Secondary | ICD-10-CM | POA: Insufficient documentation

## 2012-06-28 DIAGNOSIS — R109 Unspecified abdominal pain: Secondary | ICD-10-CM | POA: Insufficient documentation

## 2012-06-28 DIAGNOSIS — Z8639 Personal history of other endocrine, nutritional and metabolic disease: Secondary | ICD-10-CM | POA: Insufficient documentation

## 2012-06-28 DIAGNOSIS — Z8673 Personal history of transient ischemic attack (TIA), and cerebral infarction without residual deficits: Secondary | ICD-10-CM | POA: Insufficient documentation

## 2012-06-28 DIAGNOSIS — C186 Malignant neoplasm of descending colon: Secondary | ICD-10-CM

## 2012-06-28 DIAGNOSIS — F17200 Nicotine dependence, unspecified, uncomplicated: Secondary | ICD-10-CM | POA: Insufficient documentation

## 2012-06-28 DIAGNOSIS — Z79899 Other long term (current) drug therapy: Secondary | ICD-10-CM | POA: Insufficient documentation

## 2012-06-28 DIAGNOSIS — R110 Nausea: Secondary | ICD-10-CM | POA: Insufficient documentation

## 2012-06-28 DIAGNOSIS — G8929 Other chronic pain: Secondary | ICD-10-CM | POA: Insufficient documentation

## 2012-06-28 DIAGNOSIS — I10 Essential (primary) hypertension: Secondary | ICD-10-CM | POA: Insufficient documentation

## 2012-06-28 DIAGNOSIS — D649 Anemia, unspecified: Secondary | ICD-10-CM | POA: Insufficient documentation

## 2012-06-28 DIAGNOSIS — G4730 Sleep apnea, unspecified: Secondary | ICD-10-CM | POA: Insufficient documentation

## 2012-06-28 DIAGNOSIS — Z8709 Personal history of other diseases of the respiratory system: Secondary | ICD-10-CM | POA: Insufficient documentation

## 2012-06-28 DIAGNOSIS — Z9089 Acquired absence of other organs: Secondary | ICD-10-CM | POA: Insufficient documentation

## 2012-06-28 DIAGNOSIS — C189 Malignant neoplasm of colon, unspecified: Secondary | ICD-10-CM | POA: Insufficient documentation

## 2012-06-28 DIAGNOSIS — Z85038 Personal history of other malignant neoplasm of large intestine: Secondary | ICD-10-CM | POA: Insufficient documentation

## 2012-06-28 DIAGNOSIS — Z8619 Personal history of other infectious and parasitic diseases: Secondary | ICD-10-CM | POA: Insufficient documentation

## 2012-06-28 DIAGNOSIS — K219 Gastro-esophageal reflux disease without esophagitis: Secondary | ICD-10-CM | POA: Insufficient documentation

## 2012-06-28 DIAGNOSIS — Z8701 Personal history of pneumonia (recurrent): Secondary | ICD-10-CM | POA: Insufficient documentation

## 2012-06-28 DIAGNOSIS — Z8719 Personal history of other diseases of the digestive system: Secondary | ICD-10-CM | POA: Insufficient documentation

## 2012-06-28 DIAGNOSIS — F411 Generalized anxiety disorder: Secondary | ICD-10-CM | POA: Insufficient documentation

## 2012-06-28 DIAGNOSIS — Z86711 Personal history of pulmonary embolism: Secondary | ICD-10-CM | POA: Insufficient documentation

## 2012-06-28 LAB — COMPREHENSIVE METABOLIC PANEL
BUN: 12 mg/dL (ref 6–23)
CO2: 25 mEq/L (ref 19–32)
Chloride: 105 mEq/L (ref 96–112)
Creatinine, Ser: 0.75 mg/dL (ref 0.50–1.35)
GFR calc Af Amer: >90 mL/min (ref >90)
GFR calc non Af Amer: >90 mL/min (ref >90)
Glucose, Bld: 95 mg/dL (ref 70–99)
Total Bilirubin: 0.2 mg/dL — ABNORMAL LOW (ref 0.3–1.2)

## 2012-06-28 LAB — URINALYSIS, ROUTINE W REFLEX MICROSCOPIC
Leukocytes, UA: NEGATIVE
Protein, ur: NEGATIVE mg/dL
Urobilinogen, UA: 0.2 mg/dL (ref 0.0–1.0)

## 2012-06-28 LAB — CBC WITH DIFFERENTIAL/PLATELET
Basophils Relative: 0 % (ref 0–1)
HCT: 40.3 % (ref 39.0–52.0)
Hemoglobin: 13.8 g/dL (ref 13.0–17.0)
Lymphocytes Relative: 24 % (ref 12–46)
MCHC: 34.2 g/dL (ref 30.0–36.0)
Monocytes Absolute: 0.5 10*3/uL (ref 0.1–1.0)
Monocytes Relative: 7 % (ref 3–12)
Neutro Abs: 4.7 10*3/uL (ref 1.7–7.7)

## 2012-06-28 MED ORDER — SODIUM CHLORIDE 0.9 % IJ SOLN
10.0000 mL | INTRAMUSCULAR | Status: DC | PRN
  Administered 2012-06-28: 10 mL via INTRAVENOUS
  Filled 2012-06-28: qty 10

## 2012-06-28 MED ORDER — POLYETHYLENE GLYCOL 3350 17 GM/SCOOP PO POWD: 17.0000 g | Freq: Two times a day (BID) | ORAL | Status: DC

## 2012-06-28 MED ORDER — HEPARIN SOD (PORK) LOCK FLUSH 100 UNIT/ML IV SOLN
INTRAVENOUS | Status: AC
  Filled 2012-06-28: qty 5

## 2012-06-28 MED ORDER — HEPARIN SOD (PORK) LOCK FLUSH 100 UNIT/ML IV SOLN
500.0000 [IU] | Freq: Once | INTRAVENOUS | Status: AC
  Administered 2012-06-28: 500 [IU] via INTRAVENOUS
  Filled 2012-06-28: qty 5

## 2012-06-28 NOTE — ED Notes (Signed)
Complain of abdominal pain and rectal bleeding after BM. Last normal BM this morning

## 2012-06-28 NOTE — ED Provider Notes (Signed)
History  This chart was scribed for [name] Lyons, MD by [name], ED Scribe. This patient was seen in room APA19/APA19 and the patient's care was started at 1:27 PM.  CSN: [id number]  Arrival date & time 06/28/12  1238   First MD Initiated Contact with Patient 06/28/12 1327      Chief Complaint  Patient presents with  . Abdominal Pain     The history is provided by the patient. No language interpreter was used.    [name] is a 62 y.o. male who presents to the Emergency Department complaining of "weeks" of gradual onset, gradually worsening, constant left-sided abdominal pain with associated nausea and hematochezia described as normal stool mixed with bright red that started today. He reports a prior colitis diagnosis but denies any prior hemorrhoids or recent ulcer diagnoses (one prior ulcer 20 years ago).  Last CT scan was 2 weeks ago after he c/o the same pain and was admitted for a SBO. He states that he has been moving his bowels normally since then. Most recent colonoscopy was December of 2013 performed by Dr. Kendell Bane, showed polyps but nothing was done. He is on Lovenox injections twice a day for PEs, started over one year ago. He reports that he was told that Lovenox was easier to control if bleeding were to develop. He reports that colon CA was diagnosed May 2012 surgically after 4 to 5 SBOs by Dr. Leticia Penna. He denies needing a ostomy bag stating that the tumor was able to be removed surgically and reports that the CA went into remission with chemotherapy. He denies being on chemotherapy currently. He has a Band-Aid over is port-o-cath after having it flushed this morning "upstairs at [facility]" which he states was routine maintenance. He denies fever, constipation, diarrhea and SOB as associated symptoms. He has a h/o HTN, anxiety, anemia and GERD. He is a current everyday smoker but denies alcohol use.   Past Medical History  Diagnosis Date  . Pulmonary embolism 02/2010   . Hypertension   . Osteoporosis   . Arthritis   . TIA (transient ischemic attack) 10/11  . ETOH abuse     quit 03/2010  . 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 Personal history of PE (pulmonary embolism) 10/01/2010  . Blood transfusion   . S/P endoscopy September 28, 2010    erosive reflux esophagitis, Billroth I anatomy  . Shortness of breath   . Sleep apnea   . Recurrent upper respiratory infection (URI)   . Anxiety   . Pneumonia   . Anemia   . Ileus   . Chronic abdominal pain   . GERD (gastroesophageal reflux disease)   . Chest pain at rest   . Erosive esophagitis   . 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 Vitamin B12 deficiency   . Bowel obstruction 05/13/2012  . Hx of Clostridium difficile infection 01/2012    Past Surgical History  Procedure Laterality Date  . Hernia repair      right inguinal  . Appendectomy  1980s  . Cholecystectomy  1980s  . Colon surgery  May 2012    left hemicolectomy, colon cancer found at time of surgery for bowel obstruction  . Portacath placement    . Abdominal sugery      for bowel obstruction x 8, all in 1980s, except for one in 07/2010  . Esophagogastroduodenoscopy  09/28/2010  . Esophagogastroduodenoscopy  12/01/2010    Cervical web  status post dilation, erosive esophagitis, B1 hemigastrectomy, inflamed anastomosis  . Colonoscopy  03/18/2011    anastomosis at 35cm. Several adenomatous polyps removed. Sigmoid diverticulosis. Next TCS 02/2013  . Esophagogastroduodenoscopy  04/16/2011    excoriation at [facility]J c/w trauma/M-W tear, friable gastric anastomosis, dilation efferent limb  . Billroth 1 hemigastrectomy  1980s    per patient for benign duodenal tumor  . Esophagogastroduodenoscopy (egd) with esophageal dilation  02/25/2012    ION:[id number] esophageal web-s/p dilation anddisruption as described above. Status post prior gastric surgery    Family History  Problem Relation Age of Onset  . Hypertension Mother   .  Arthritis Mother   . Pneumonia Mother   . Hypertension Father   . Heart attack Father     History  Substance Use Topics  . Smoking status: Current Every Day Smoker -- 0.50 packs/day for 40 years    Types: Cigarettes  . Smokeless tobacco: Never Used  . Alcohol Use: No     Comment: last drink was Apr 16 2010      Review of Systems  Constitutional: Negative for fever.  Gastrointestinal: Positive for nausea, abdominal pain and anal bleeding. Negative for vomiting, diarrhea and constipation.  All other systems reviewed and are negative.    Allergies  Review of patient's allergies indicates no known allergies.  Home Medications   Current Outpatient Rx  Name  Route  Sig  Dispense  Refill  . alendronate (FOSAMAX) 70 MG tablet   Oral   Take 70 mg by mouth every 7 (seven) days. On sundays         . atorvastatin (LIPITOR) 20 MG tablet   Oral   Take 20 mg by mouth at bedtime.          . cholecalciferol (VITAMIN D) 1000 UNITS tablet   Oral   Take 1,000 Units by mouth every morning.         . cyanocobalamin (,VITAMIN B-12,) 1000 MCG/ML injection   Intramuscular   Inject 1,000 mcg into the muscle every 30 (thirty) days.         . cyclobenzaprine (FLEXERIL) 10 MG tablet   Oral   Take 10 mg by mouth daily as needed for muscle spasms. Muscle Spasms         . dexlansoprazole (DEXILANT) 60 MG capsule   Oral   Take 60 mg by mouth every morning.         . docusate sodium (COLACE) 100 MG capsule   Oral   Take 100 mg by mouth 2 (two) times daily.         . ferrous sulfate 325 (65 FE) MG tablet   Oral   Take 325 mg by mouth 2 (two) times daily.         . folic acid (FOLVITE) 1 MG tablet   Oral   Take 1 mg by mouth every morning.          [name] HYDROcodone-acetaminophen (NORCO) 5-325 MG per tablet   Oral   Take 1 tablet by mouth every 4 (four) hours as needed for pain.   15 tablet   0   . LORazepam (ATIVAN) 1 MG tablet   Oral   Take 1 mg by mouth every 3  (three) hours as needed. anxiety         . Nutritional Supplements (JUICE PLUS FIBRE) LIQD   Oral   Take 1 Can by mouth daily.         . ondansetron (ZOFRAN)  4 MG tablet   Oral   Take 4 mg by mouth daily as needed for nausea.         . polyethylene glycol powder (GLYCOLAX/MIRALAX) powder   Oral   Take 17 g by mouth as needed. For constipation         . prochlorperazine (COMPAZINE) 10 MG tablet   Oral   Take 10 mg by mouth every 6 (six) hours as needed. Nausea and Vomiting         . sertraline (ZOLOFT) 100 MG tablet   Oral   Take 100 mg by mouth at bedtime.         . sucralfate (CARAFATE) 1 GM/10ML suspension   Oral   Take 10 mLs (1 g total) by mouth 4 (four) times daily.   420 mL   0   . thiamine 100 MG tablet   Oral   Take 100 mg by mouth every morning.          . traMADol (ULTRAM) 50 MG tablet   Oral   Take 1-2 tablets (50-100 mg total) by mouth every 4 (four) hours as needed. For pain   30 tablet   1   . valsartan-hydrochlorothiazide (DIOVAN-HCT) 80-12.5 MG per tablet   Oral   Take 1 tablet by mouth every morning.          . Vitamin D, Ergocalciferol, (DRISDOL) 50000 UNITS CAPS   Oral   Take 500,000 Units by mouth once a week. Takes on Sunday           Triage Vitals: BP 163/95  Pulse 101  Temp(Src) 97.4 F (36.3 C) (Oral)  Resp 18  Ht 5\' 9"  (1.753 m)  Wt 140 lb (63.504 kg)  BMI 20.67 kg/m2  SpO2 100%  Physical Exam  Nursing note and vitals reviewed. Constitutional: He is oriented to person, place, and time. He appears well-developed and well-nourished. No distress.  HENT:  Head: Normocephalic and atraumatic.  Eyes: Conjunctivae and EOM are normal.  Neck: Neck supple. No tracheal deviation present.  Cardiovascular: Normal rate and regular rhythm.   Pulmonary/Chest: Effort normal and breath sounds normal. No respiratory distress.  Abdominal: Soft. There is tenderness (left mid and LLQ tenderness to palpation). There is no rebound  and no guarding.  Genitourinary: Guaiac negative stool.  Normal rectal tone, no gross blood, no fissures or hemorrhoids noted  Musculoskeletal: Normal range of motion. He exhibits no edema (no ankle edema).  Neurological: He is alert and oriented to person, place, and time.  Skin: Skin is warm and dry.  Psychiatric: He has a normal mood and affect. His behavior is normal.    ED Course  Procedures (including critical care time)  DIAGNOSTIC STUDIES: Oxygen Saturation is 100% on room air, normal by my interpretation.    COORDINATION OF CARE: 1:44 PM-Discussed treatment plan which includes abdomen XR, CBC panel, CMP, and UA with pt at bedside and pt agreed to plan.   Labs Reviewed - No data to display No results found.   No diagnosis found.    MDM  The patient presents here with left sided abd pain and an episode of bloody stool.  The abdomen is benign, the stool is heme negative, and the abd series is not consistent with obstruction.  I am unsure of where the blood came from, but he does not appear to be actively bleeding.  I spoke with Dr. Luvenia Starch PA who evaluated the patient in the ED.  He is comfortable  with discharge.  He wants to start miralax and follow up with him in the office in the upcoming days.  To return prn if the bleeding worsens.    I personally performed the services described in this documentation, which was scribed in my presence. The recorded information has been reviewed and is accurate.        [name] Lyons, MD 06/28/12 1739

## 2012-06-28 NOTE — Progress Notes (Signed)
Selena Batten presented for Portacath access and flush. Proper placement of portacath confirmed by CXR. Portacath located rt chest wall accessed with  H 20 needle. Good blood return present. Portacath flushed with 20ml NS and 500U/73ml Heparin and needle removed intact. Procedure without incident. Patient tolerated procedure well.

## 2012-06-28 NOTE — ED Notes (Signed)
Stool for Occult blood negative

## 2012-07-11 ENCOUNTER — Encounter: Payer: Self-pay | Admitting: Gastroenterology

## 2012-07-11 ENCOUNTER — Ambulatory Visit (INDEPENDENT_AMBULATORY_CARE_PROVIDER_SITE_OTHER): Payer: Federal, State, Local not specified - PPO | Admitting: Gastroenterology

## 2012-07-11 VITALS — BP 129/90 | HR 107 | Temp 98.4°F | Ht 69.0 in | Wt 139.2 lb

## 2012-07-11 DIAGNOSIS — C189 Malignant neoplasm of colon, unspecified: Secondary | ICD-10-CM

## 2012-07-11 DIAGNOSIS — K56609 Unspecified intestinal obstruction, unspecified as to partial versus complete obstruction: Secondary | ICD-10-CM

## 2012-07-11 DIAGNOSIS — R933 Abnormal findings on diagnostic imaging of other parts of digestive tract: Secondary | ICD-10-CM

## 2012-07-11 DIAGNOSIS — K625 Hemorrhage of anus and rectum: Secondary | ICD-10-CM | POA: Insufficient documentation

## 2012-07-11 MED ORDER — PEG 3350-KCL-NA BICARB-NACL 420 G PO SOLR: 4000.0000 mL | ORAL | Status: DC

## 2012-07-11 NOTE — Progress Notes (Signed)
Primary Care Physician: [name], MD  Primary Gastroenterologist:  Roetta Sessions, MD  Chief Complaint  Patient presents with  . Abdominal Pain  . Rectal Bleeding  . Nausea    HPI: [name] is a 62 y.o. male here for f/u. He has h/o Stage III colon cancer, s/p left hemicolectomy, postsurgical adjuvant chemotherapy. 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 His last surgery was in May 2012 for bowel structure which turned out to be colon cancer. Last admission for SBO was from 06/12/12 to 06/20/12, treated conservatively. He has been back in ED two occasions since then with similar symptoms but without clear SBO on abd films.  Patient tells me he is hospitalized in [place] back in February 2014 with bowel obstruction. According to records received from Dr. Leticia Penna, he had a CT at that time which showed concern for possible anastomotic stricture in the colon. Because of this abnormality it was recommended that he had a colonoscopy.   Two episodes of fresh blood noted on the stool recently. He went to the emergency department with the second episode. For the last four to five days, daily BMs, soft. Takes miralax once per day and stool softners TID. Tramadol every day for arthritis. Generally when the obstructions recur he will go from having regular bowel movements to obstruction within 24-48 hours.   No heartburn controlled on Dexilant. No dysphagia. Appetite good. No weight loss. No melena. Takes Carafate only as needed.  Current Outpatient Prescriptions  Medication Sig Dispense Refill  . alendronate (FOSAMAX) 70 MG tablet Take 70 mg by mouth every 7 (seven) days. On sundays      . atorvastatin (LIPITOR) 20 MG tablet Take 20 mg by mouth at bedtime.       . cholecalciferol (VITAMIN D) 1000 UNITS tablet Take 1,000 Units by mouth every morning.      . cyanocobalamin (,VITAMIN B-12,) 1000 MCG/ML injection Inject 1,000 mcg into the muscle every 30 (thirty) days.       . cyclobenzaprine (FLEXERIL) 10 MG tablet Take 10 mg by mouth daily as needed for muscle spasms.       [name] dexlansoprazole (DEXILANT) 60 MG capsule Take 60 mg by mouth every morning.      . docusate sodium (COLACE) 100 MG capsule Take 100 mg by mouth 2 (two) times daily.      [name] enoxaparin (LOVENOX) 60 MG/0.6ML injection Inject 60 mg into the skin daily.      . ferrous sulfate 325 (65 FE) MG tablet Take 325 mg by mouth 2 (two) times daily.      . folic acid (FOLVITE) 1 MG tablet Take 1 mg by mouth every morning.       [name] LORazepam (ATIVAN) 1 MG tablet Take 1 mg by mouth every 3 (three) hours as needed. anxiety      . Nutritional Supplements (JUICE PLUS FIBRE) LIQD Take 1 Can by mouth daily.      . ondansetron (ZOFRAN) 4 MG tablet Take 4 mg by mouth daily as needed for nausea.      . polyethylene glycol powder (GLYCOLAX/MIRALAX) powder Take 17 g by mouth as needed. For constipation      . prochlorperazine (COMPAZINE) 10 MG tablet Take 10 mg by mouth every 6 (six) hours as needed. Nausea and Vomiting      . sertraline (ZOLOFT) 100 MG tablet Take 100 mg by mouth at bedtime.      . sucralfate (CARAFATE) 1 GM/10ML suspension  Take 10 mLs (1 g total) by mouth 4 (four) times daily.  420 mL  0  . thiamine 100 MG tablet Take 100 mg by mouth every morning.       . traMADol (ULTRAM) 50 MG tablet Take 1-2 tablets (50-100 mg total) by mouth every 4 (four) hours as needed. For pain  30 tablet  1  . valsartan-hydrochlorothiazide (DIOVAN-HCT) 80-12.5 MG per tablet Take 1 tablet by mouth every morning.       . Vitamin D, Ergocalciferol, (DRISDOL) 50000 UNITS CAPS Take 50,000 Units by mouth once a week. Takes on Sunday      . polyethylene glycol-electrolytes (TRILYTE) 420 G solution Take 4,000 mLs by mouth as directed.  4000 mL  0   No current facility-administered medications for this visit.    Allergies as of 07/11/2012  . (No Known Allergies)    ROS:  General: Negative for anorexia, weight loss, fever,  chills, fatigue, weakness. ENT: Negative for hoarseness, difficulty swallowing , nasal congestion. CV: Negative for chest pain, angina, palpitations, dyspnea on exertion, peripheral edema.  Respiratory: Negative for dyspnea at rest, dyspnea on exertion, cough, sputum, wheezing.  GI: See history of present illness. GU:  Negative for dysuria, hematuria, urinary incontinence, urinary frequency, nocturnal urination.  Endo: Negative for unusual weight change.    Physical Examination:   BP 129/90  Pulse 107  Temp(Src) 98.4 F (36.9 C) (Oral)  Ht 5\' 9"  (1.753 m)  Wt 139 lb 3.2 oz (63.141 kg)  BMI 20.55 kg/m2  General: Well-nourished, well-developed in no acute distress.  Eyes: No icterus. Mouth: Oropharyngeal mucosa moist and pink , no lesions erythema or exudate. Lungs: Clear to auscultation bilaterally.  Heart: Regular rate and rhythm, no murmurs rubs or gallops.  Abdomen: Bowel sounds are normal, mild lower abdominal tenderness, nondistended, no hepatosplenomegaly or masses, no abdominal bruits or hernia , no rebound or guarding.   Extremities: No lower extremity edema. No clubbing or deformities. Neuro: Alert and oriented x 4   Skin: Warm and dry, no jaundice.   Psych: Alert and cooperative, normal mood and affect.  Labs:  Lab Results  Component Value Date   WBC 7.0 06/28/2012   HGB 13.8 06/28/2012   HCT 40.3 06/28/2012   MCV 86.3 06/28/2012   PLT 226 06/28/2012   Lab Results  Component Value Date   CREATININE 0.75 06/28/2012   BUN 12 06/28/2012   NA 139 06/28/2012   K 4.1 06/28/2012   CL 105 06/28/2012   CO2 25 06/28/2012   Lab Results  Component Value Date   ALT 7 06/28/2012   AST 12 06/28/2012   ALKPHOS 92 06/28/2012   BILITOT 0.2* 06/28/2012    Imaging Studies:   Ct Abdomen Pelvis W Contrast  06/12/2012  *RADIOLOGY REPORT*  Clinical Data: And vomiting.  Dark stools.  CT ABDOMEN AND PELVIS WITH CONTRAST  Technique:  Multidetector CT imaging of the abdomen and pelvis was performed  following the standard protocol during bolus administration of intravenous contrast.  Contrast: OMNIPAQUE IOHEXOL 300 MG/ML  SOLN  Comparison: 09/10/2011  Findings: Stable hypodensities in the right kidney and left lobe of the liver.  Chronic changes of the left kidney with scarring.  There is distention of the colon and prominent stool burden in the ascending colon.  There are dilated loops of proximal small bowel and decompressed distal small bowel loops.  The transition point is at the midline towards the pelvis.  Spleen is within normal limits  Pancreas is atrophic.  Adrenal glands are within normal limits.  There are air bubbles in the subcutaneous fat of the left abdomen likely from a recent injection.  Bladder is mildly distended.  Prostate is within normal limits.  No free fluid.  Postoperative changes involving the stomach are stable.  No acute bony deformity.  IMPRESSION: Small bowel obstruction pattern.  The transition point is in the mid small bowel towards the pelvis at the midline.  Prominent stool burden in the ascending colon.  Stable chronic findings.   Original Report Authenticated By: Jolaine Click, M.D.    Dg Abd Acute W/chest  06/28/2012  *RADIOLOGY REPORT*  Clinical Data: Abdominal pain.  Bloody stool.  ACUTE ABDOMEN SERIES (ABDOMEN 2 VIEW & CHEST 1 VIEW)  Comparison: 06/23/2012  Findings: There is a left chest wall porta-catheter with tip in the SVC.  Heart size is normal.  No pleural effusion or edema.  No airspace consolidation identified.  There is a nonspecific bowel gas pattern.  There are a few prominent mildly distended loops of small bowel and air-fluid levels.  Moderate gas and stool identified throughout the colon up to the rectum.  No specific features to suggest bowel obstruction.  IMPRESSION:  1.  No acute cardiopulmonary abnormalities. 2.  Moderate stool burden within the colon and rectum suggestive of constipation. 3.  Nonspecific, mild increased caliber of small bowel  loops.   Original Report Authenticated By: Signa Kell, M.D.    Dg Abd Acute W/chest  06/23/2012  *RADIOLOGY REPORT*  Clinical Data: Abdominal pain, nausea, and vomiting for 1 day. History of colon cancer with multiple surgeries.  ACUTE ABDOMEN SERIES (ABDOMEN 2 VIEW & CHEST 1 VIEW)  Comparison: Abdomen 06/18/2012 and 06/16/2012.  Chest 07/21/2011.  Findings: Pulmonary hyperinflation suggesting emphysema.  Power port type central venous catheter with tip over the upper SVC.  No change in position.  No focal airspace consolidation in the lungs. No blunting of costophrenic angles.  No pneumothorax.  Surgical clips in the upper abdomen.  Gas and stool in the colon. Distension of gas filled transverse colon and mid abdominal small bowel loops.  Changes are nonspecific but could be due to ileus, obstruction, or pseudo obstruction due to constipation.  Findings are similar to most recent previous abdominal study. No free intra- abdominal air.  No abnormal air fluid levels.  No radiopaque stones.  Mild degenerative changes in the spine.  IMPRESSION: No evidence of active pulmonary disease.  Stool filled colon with gas distension of transverse colon and mid abdominal small bowel. Changes are nonspecific and could be due to obstruction or ileus. No significant change since prior study.   Original Report Authenticated By: Burman Nieves, M.D.

## 2012-07-11 NOTE — Patient Instructions (Addendum)
Colonoscopy with Dr. Jena Gauss. See separate instructions.  Last dose of Lovenox at 10am the day before the procedure. Dr. Jena Gauss will give you instructions regarding resuming Lovenox after the procedure.

## 2012-07-11 NOTE — Assessment & Plan Note (Signed)
62 year old gentleman with history of recurrent bowel obstruction requiring multiple surgeries back in the 1980s. He presented with obstruction in May 2012 and at that time was determined to be due to a colon cancer. Patient underwent left hemicolectomy and adjuvant chemotherapy. He has continued to have bowel obstructions treated conservatively since that time. The last one was back in March. Those concern for transition point near the anastomosis on the study done in [place] according to the office today for Dr. Leticia Penna. We do not have that specific report available to [place] at this time. Patient's last colonoscopy was in December 2012. Multiple polyps removed at that time as well. Have discussed case with Dr. Jena Gauss. At this time he needs to have repeat colonoscopy with special attention to the anastomosis. Also patient has been having a couple of episodes of intermittent rectal bleeding. Patient is agreeable.  I have discussed the risks, alternatives, benefits with regards to but not limited to the risk of reaction to medication, bleeding, infection, perforation and the patient is agreeable to proceed. Written consent to be obtained.  He will take his last dose of Lovenox at 10 AM the day before the procedure. Further instructions on resuming Lovenox to be provided by Dr. Jena Gauss after the procedure.

## 2012-07-11 NOTE — Progress Notes (Signed)
Cc PCP 

## 2012-07-13 ENCOUNTER — Encounter (HOSPITAL_COMMUNITY): Payer: Self-pay | Admitting: Pharmacy Technician

## 2012-07-17 HISTORY — PX: CARDIAC CATHETERIZATION: SHX172

## 2012-07-24 ENCOUNTER — Ambulatory Visit (HOSPITAL_COMMUNITY)
Admission: RE | Admit: 2012-07-24 | Discharge: 2012-07-24 | Disposition: A | Discharge status: Home / Self Care | Payer: Federal, State, Local not specified - PPO | Source: Ambulatory Visit | Attending: Internal Medicine | Admitting: Internal Medicine

## 2012-07-24 ENCOUNTER — Encounter (HOSPITAL_COMMUNITY)
Admission: RE | Disposition: A | Discharge status: Home / Self Care | Payer: Self-pay | Source: Ambulatory Visit | Attending: Internal Medicine

## 2012-07-24 ENCOUNTER — Encounter (HOSPITAL_COMMUNITY): Payer: Self-pay | Admitting: *Deleted

## 2012-07-24 DIAGNOSIS — D128 Benign neoplasm of rectum: Secondary | ICD-10-CM | POA: Insufficient documentation

## 2012-07-24 DIAGNOSIS — R933 Abnormal findings on diagnostic imaging of other parts of digestive tract: Secondary | ICD-10-CM

## 2012-07-24 DIAGNOSIS — K56609 Unspecified intestinal obstruction, unspecified as to partial versus complete obstruction: Secondary | ICD-10-CM

## 2012-07-24 DIAGNOSIS — K921 Melena: Secondary | ICD-10-CM

## 2012-07-24 DIAGNOSIS — K625 Hemorrhage of anus and rectum: Secondary | ICD-10-CM

## 2012-07-24 DIAGNOSIS — Z85038 Personal history of other malignant neoplasm of large intestine: Secondary | ICD-10-CM | POA: Insufficient documentation

## 2012-07-24 DIAGNOSIS — D126 Benign neoplasm of colon, unspecified: Secondary | ICD-10-CM | POA: Insufficient documentation

## 2012-07-24 DIAGNOSIS — K620 Anal polyp: Secondary | ICD-10-CM

## 2012-07-24 DIAGNOSIS — K621 Rectal polyp: Secondary | ICD-10-CM

## 2012-07-24 DIAGNOSIS — C189 Malignant neoplasm of colon, unspecified: Secondary | ICD-10-CM

## 2012-07-24 HISTORY — PX: COLONOSCOPY: SHX5424

## 2012-07-24 SURGERY — COLONOSCOPY
Anesthesia: Moderate Sedation

## 2012-07-24 MED ORDER — PROMETHAZINE HCL 25 MG/ML IJ SOLN
INTRAMUSCULAR | Status: AC
  Filled 2012-07-24: qty 1

## 2012-07-24 MED ORDER — MIDAZOLAM HCL 5 MG/5ML IJ SOLN
INTRAMUSCULAR | Status: AC
  Filled 2012-07-24: qty 10

## 2012-07-24 MED ORDER — SODIUM CHLORIDE 0.9 % IV SOLN
INTRAVENOUS | Status: DC
  Administered 2012-07-24: 1000 mL via INTRAVENOUS

## 2012-07-24 MED ORDER — MEPERIDINE HCL 100 MG/ML IJ SOLN
INTRAMUSCULAR | Status: AC
  Filled 2012-07-24: qty 2

## 2012-07-24 MED ORDER — SIMETHICONE 40 MG/0.6ML PO SUSP
ORAL | Status: DC | PRN
  Administered 2012-07-24: 15:00:00

## 2012-07-24 MED ORDER — MIDAZOLAM HCL 5 MG/5ML IJ SOLN
INTRAMUSCULAR | Status: DC | PRN
  Administered 2012-07-24 (×3): 2 mg via INTRAVENOUS

## 2012-07-24 MED ORDER — PROMETHAZINE HCL 25 MG/ML IJ SOLN
INTRAMUSCULAR | Status: DC | PRN
  Administered 2012-07-24: 12.5 mg via INTRAVENOUS

## 2012-07-24 MED ORDER — ONDANSETRON HCL 4 MG/2ML IJ SOLN
INTRAMUSCULAR | Status: DC | PRN
  Administered 2012-07-24: 4 mg via INTRAVENOUS

## 2012-07-24 MED ORDER — ONDANSETRON HCL 4 MG/2ML IJ SOLN
INTRAMUSCULAR | Status: AC
  Filled 2012-07-24: qty 2

## 2012-07-24 MED ORDER — SODIUM CHLORIDE 0.9 % IJ SOLN
INTRAMUSCULAR | Status: AC
  Filled 2012-07-24: qty 10

## 2012-07-24 MED ORDER — MEPERIDINE HCL 100 MG/ML IJ SOLN
INTRAMUSCULAR | Status: DC | PRN
  Administered 2012-07-24 (×3): 50 mg via INTRAVENOUS

## 2012-07-24 NOTE — Interval H&P Note (Signed)
History and Physical Interval Note:  07/24/2012 2:45 PM  Selena Batten  has presented today for surgery, with the diagnosis of HISTORY OF COLON CANCER, RECTAL BLEEDING ABNORMAL CT OF COLON  The various methods of treatment have been discussed with the patient and family. After consideration of risks, benefits and other options for treatment, the patient has consented to  Procedure(s) with comments: COLONOSCOPY (N/A) - 3:00 as a surgical intervention .  The patient's history has been reviewed, patient examined, no change in status, stable for surgery.  I have reviewed the patient's chart and labs.  Questions were answered to the patient's satisfaction.     Somer Trotter  No rectal bleeding in couple weeks. Still may go 2-3 days without bowel movement taking MiraLax once daily. Colonoscopy today per plan.The risks, benefits, limitations, alternatives and imponderables have been reviewed with the patient. Questions have been answered. All parties are agreeable.

## 2012-07-24 NOTE — H&P (View-Only) (Signed)
Primary Care Physician: DAVIDSON,ERIC, MD  Primary Gastroenterologist:  Macky [name], MD  Chief Complaint  Patient presents with  . Abdominal Pain  . Rectal Bleeding  . Nausea    HPI: [name] is a 61 y.o. male here for f/u. He has h/o Stage III colon cancer, s/p left hemicolectomy, postsurgical adjuvant chemotherapy. 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 His last surgery was in May 2012 for bowel structure which turned out to be colon cancer. Last admission for SBO was from 06/12/12 to 06/20/12, treated conservatively. He has been back in ED two occasions since then with similar symptoms but without clear SBO on abd films.  Patient tells me he is hospitalized in [place] back in February 2014 with bowel obstruction. According to records received from Dr. Ziegler, he had a CT at that time which showed concern for possible anastomotic stricture in the colon. Because of this abnormality it was recommended that he had a colonoscopy.   Two episodes of fresh blood noted on the stool recently. He went to the emergency department with the second episode. For the last four to five days, daily BMs, soft. Takes miralax once per day and stool softners TID. Tramadol every day for arthritis. Generally when the obstructions recur he will go from having regular bowel movements to obstruction within 24-48 hours.   No heartburn controlled on Dexilant. No dysphagia. Appetite good. No weight loss. No melena. Takes Carafate only as needed.  Current Outpatient Prescriptions  Medication Sig Dispense Refill  . alendronate (FOSAMAX) 70 MG tablet Take 70 mg by mouth every 7 (seven) days. On sundays      . atorvastatin (LIPITOR) 20 MG tablet Take 20 mg by mouth at bedtime.       . cholecalciferol (VITAMIN D) 1000 UNITS tablet Take 1,000 Units by mouth every morning.      . cyanocobalamin (,VITAMIN B-12,) 1000 MCG/ML injection Inject 1,000 mcg into the muscle every 30 (thirty) days.       . cyclobenzaprine (FLEXERIL) 10 MG tablet Take 10 mg by mouth daily as needed for muscle spasms.       . dexlansoprazole (DEXILANT) 60 MG capsule Take 60 mg by mouth every morning.      . docusate sodium (COLACE) 100 MG capsule Take 100 mg by mouth 2 (two) times daily.      . enoxaparin (LOVENOX) 60 MG/0.6ML injection Inject 60 mg into the skin daily.      . ferrous sulfate 325 (65 FE) MG tablet Take 325 mg by mouth 2 (two) times daily.      . folic acid (FOLVITE) 1 MG tablet Take 1 mg by mouth every morning.       . LORazepam (ATIVAN) 1 MG tablet Take 1 mg by mouth every 3 (three) hours as needed. anxiety      . Nutritional Supplements (JUICE PLUS FIBRE) LIQD Take 1 Can by mouth daily.      . ondansetron (ZOFRAN) 4 MG tablet Take 4 mg by mouth daily as needed for nausea.      . polyethylene glycol powder (GLYCOLAX/MIRALAX) powder Take 17 g by mouth as needed. For constipation      . prochlorperazine (COMPAZINE) 10 MG tablet Take 10 mg by mouth every 6 (six) hours as needed. Nausea and Vomiting      . sertraline (ZOLOFT) 100 MG tablet Take 100 mg by mouth at bedtime.      . sucralfate (CARAFATE) 1 GM/10ML suspension   Take 10 mLs (1 g total) by mouth 4 (four) times daily.  420 mL  0  . thiamine 100 MG tablet Take 100 mg by mouth every morning.       . traMADol (ULTRAM) 50 MG tablet Take 1-2 tablets (50-100 mg total) by mouth every 4 (four) hours as needed. For pain  30 tablet  1  . valsartan-hydrochlorothiazide (DIOVAN-HCT) 80-12.5 MG per tablet Take 1 tablet by mouth every morning.       . Vitamin D, Ergocalciferol, (DRISDOL) 50000 UNITS CAPS Take 50,000 Units by mouth once a week. Takes on Sunday      . polyethylene glycol-electrolytes (TRILYTE) 420 G solution Take 4,000 mLs by mouth as directed.  4000 mL  0   No current facility-administered medications for this visit.    Allergies as of 07/11/2012  . (No Known Allergies)    ROS:  General: Negative for anorexia, weight loss, fever,  chills, fatigue, weakness. ENT: Negative for hoarseness, difficulty swallowing , nasal congestion. CV: Negative for chest pain, angina, palpitations, dyspnea on exertion, peripheral edema.  Respiratory: Negative for dyspnea at rest, dyspnea on exertion, cough, sputum, wheezing.  GI: See history of present illness. GU:  Negative for dysuria, hematuria, urinary incontinence, urinary frequency, nocturnal urination.  Endo: Negative for unusual weight change.    Physical Examination:   BP 129/90  Pulse 107  Temp(Src) 98.4 F (36.9 C) (Oral)  Ht 5' 9" (1.753 m)  Wt 139 lb 3.2 oz (63.141 kg)  BMI 20.55 kg/m2  General: Well-nourished, well-developed in no acute distress.  Eyes: No icterus. Mouth: Oropharyngeal mucosa moist and pink , no lesions erythema or exudate. Lungs: Clear to auscultation bilaterally.  Heart: Regular rate and rhythm, no murmurs rubs or gallops.  Abdomen: Bowel sounds are normal, mild lower abdominal tenderness, nondistended, no hepatosplenomegaly or masses, no abdominal bruits or hernia , no rebound or guarding.   Extremities: No lower extremity edema. No clubbing or deformities. Neuro: Alert and oriented x 4   Skin: Warm and dry, no jaundice.   Psych: Alert and cooperative, normal mood and affect.  Labs:  Lab Results  Component Value Date   WBC 7.0 06/28/2012   HGB 13.8 06/28/2012   HCT 40.3 06/28/2012   MCV 86.3 06/28/2012   PLT 226 06/28/2012   Lab Results  Component Value Date   CREATININE 0.75 06/28/2012   BUN 12 06/28/2012   NA 139 06/28/2012   K 4.1 06/28/2012   CL 105 06/28/2012   CO2 25 06/28/2012   Lab Results  Component Value Date   ALT 7 06/28/2012   AST 12 06/28/2012   ALKPHOS 92 06/28/2012   BILITOT 0.2* 06/28/2012    Imaging Studies:   Ct Abdomen Pelvis W Contrast  06/12/2012  *RADIOLOGY REPORT*  Clinical Data: And vomiting.  Dark stools.  CT ABDOMEN AND PELVIS WITH CONTRAST  Technique:  Multidetector CT imaging of the abdomen and pelvis was performed  following the standard protocol during bolus administration of intravenous contrast.  Contrast: 100mL OMNIPAQUE IOHEXOL 300 MG/ML  SOLN  Comparison: 09/10/2011  Findings: Stable hypodensities in the right kidney and left lobe of the liver.  Chronic changes of the left kidney with scarring.  There is distention of the colon and prominent stool burden in the ascending colon.  There are dilated loops of proximal small bowel and decompressed distal small bowel loops.  The transition point is at the midline towards the pelvis.  Spleen is within normal limits    Pancreas is atrophic.  Adrenal glands are within normal limits.  There are air bubbles in the subcutaneous fat of the left abdomen likely from a recent injection.  Bladder is mildly distended.  Prostate is within normal limits.  No free fluid.  Postoperative changes involving the stomach are stable.  No acute bony deformity.  IMPRESSION: Small bowel obstruction pattern.  The transition point is in the mid small bowel towards the pelvis at the midline.  Prominent stool burden in the ascending colon.  Stable chronic findings.   Original Report Authenticated By: Arthur Hoss, M.D.    Dg Abd Acute W/chest  06/28/2012  *RADIOLOGY REPORT*  Clinical Data: Abdominal pain.  Bloody stool.  ACUTE ABDOMEN SERIES (ABDOMEN 2 VIEW & CHEST 1 VIEW)  Comparison: 06/23/2012  Findings: There is a left chest wall porta-catheter with tip in the SVC.  Heart size is normal.  No pleural effusion or edema.  No airspace consolidation identified.  There is a nonspecific bowel gas pattern.  There are a few prominent mildly distended loops of small bowel and air-fluid levels.  Moderate gas and stool identified throughout the colon up to the rectum.  No specific features to suggest bowel obstruction.  IMPRESSION:  1.  No acute cardiopulmonary abnormalities. 2.  Moderate stool burden within the colon and rectum suggestive of constipation. 3.  Nonspecific, mild increased caliber of small bowel  loops.   Original Report Authenticated By: Taylor Stroud, M.D.    Dg Abd Acute W/chest  06/23/2012  *RADIOLOGY REPORT*  Clinical Data: Abdominal pain, nausea, and vomiting for 1 day. History of colon cancer with multiple surgeries.  ACUTE ABDOMEN SERIES (ABDOMEN 2 VIEW & CHEST 1 VIEW)  Comparison: Abdomen 06/18/2012 and 06/16/2012.  Chest 07/21/2011.  Findings: Pulmonary hyperinflation suggesting emphysema.  Power port type central venous catheter with tip over the upper SVC.  No change in position.  No focal airspace consolidation in the lungs. No blunting of costophrenic angles.  No pneumothorax.  Surgical clips in the upper abdomen.  Gas and stool in the colon. Distension of gas filled transverse colon and mid abdominal small bowel loops.  Changes are nonspecific but could be due to ileus, obstruction, or pseudo obstruction due to constipation.  Findings are similar to most recent previous abdominal study. No free intra- abdominal air.  No abnormal air fluid levels.  No radiopaque stones.  Mild degenerative changes in the spine.  IMPRESSION: No evidence of active pulmonary disease.  Stool filled colon with gas distension of transverse colon and mid abdominal small bowel. Changes are nonspecific and could be due to obstruction or ileus. No significant change since prior study.   Original Report Authenticated By: William Stevens, M.D.         

## 2012-07-24 NOTE — Op Note (Signed)
[facility] [address] [place], [address]   COLONOSCOPY PROCEDURE REPORT  PATIENT: [name], [name]  MR#:         [id number] BIRTHDATE: 29-Mar-1950 , 61  yrs. old GENDER: Male ENDOSCOPIST: R.  Roetta Sessions, MD FACP FACG REFERRED BY:  [name], M.D.  Glenford Peers, M.D. PROCEDURE DATE:  07/24/2012 PROCEDURE:     Colonoscopy with snare polypectomy and biopsy  INDICATIONS: Paper hematochezia; constipation; abnormal anastomosis suggested by recent CT. History of colon cancer  INFORMED CONSENT:  The risks, benefits, alternatives and imponderables including but not limited to bleeding, perforation as well as the possibility of a missed lesion have been reviewed.  The potential for biopsy, lesion removal, etc. have also been discussed.  Questions have been answered.  All parties agreeable. Please see the history and physical in the medical record for more information.  MEDICATIONS: Versed 6 mg IV and Demerol 150 mg IV in divided doses. Phenergan 12.5 mg IV. Zofran 4 mg daily.  DESCRIPTION OF PROCEDURE:  After a digital rectal exam was performed, the EC-[id number] ([id number])  colonoscope was advanced from the anus through the rectum and colon to the area of the cecum, ileocecal valve and appendiceal orifice.  The cecum was deeply intubated.  These structures were well-seen and photographed for the record.  From the level of the cecum and ileocecal valve, the scope was slowly and cautiously withdrawn.  The mucosal surfaces were carefully surveyed utilizing scope tip deflection to facilitate fold flattening as needed.  The scope was pulled down into the rectum where a thorough examination including retroflexion was performed.    FINDINGS:  Adequate preparation. Normal rectum aside from a diminutive polyp just inside the anal verge a 2 cm. Surgical anastomosis identified at 45 cm. This segment of his lower GI tract appeared entirely normal except for a 5 mm  polyp of the proximal edge of the anastomosis. The The remaining residual colonic mucosa all way to the cecum appeared otherwise normal.  THERAPEUTIC / DIAGNOSTIC MANEUVERS PERFORMED:  direct bile drink cold biopsy forcep technique. The polyp and the anastomosis was removed with cold snare technique.  COMPLICATIONS: None  CECAL WITHDRAWAL TIME:  11 minutes  IMPRESSION:  Status post segmental resection with normal-appearing colonic anastomosis aside from an adjacent polyp-removed as described above. Rectal polyp-removed as described above. CT findings appear to have been artifactual  RECOMMENDATIONS: Followup on pathology. Resume Lovenox tomorrow.  Utilize MiraLax every day and twice daily on any day without a bowel movement.   _______________________________ eSigned:  R. Roetta Sessions, MD FACP [facility] 07/24/2012 3:27 PM   CC:    PATIENT NAME:  [name], [name] MR#: [id number]

## 2012-07-25 ENCOUNTER — Encounter: Payer: Self-pay | Admitting: Internal Medicine

## 2012-07-25 NOTE — Progress Notes (Signed)
Pt is aware.  

## 2012-07-25 NOTE — Progress Notes (Signed)
Tried to call pt- LMOM 

## 2012-07-25 NOTE — Progress Notes (Signed)
[name], male   DOB: [birth year], 62 y.o.   MRN: [id number] Please instruct patient to resume his Lovenox today, May 6.

## 2012-07-26 ENCOUNTER — Encounter: Payer: Self-pay | Admitting: Internal Medicine

## 2012-07-27 ENCOUNTER — Encounter (HOSPITAL_COMMUNITY): Payer: Self-pay | Admitting: Internal Medicine

## 2012-08-22 ENCOUNTER — Encounter (HOSPITAL_COMMUNITY): Payer: Federal, State, Local not specified - PPO | Attending: Oncology

## 2012-08-22 DIAGNOSIS — C186 Malignant neoplasm of descending colon: Secondary | ICD-10-CM

## 2012-08-22 DIAGNOSIS — C189 Malignant neoplasm of colon, unspecified: Secondary | ICD-10-CM | POA: Insufficient documentation

## 2012-08-22 MED ORDER — HEPARIN SOD (PORK) LOCK FLUSH 100 UNIT/ML IV SOLN
INTRAVENOUS | Status: AC
  Filled 2012-08-22: qty 5

## 2012-08-22 MED ORDER — SODIUM CHLORIDE 0.9 % IJ SOLN
10.0000 mL | INTRAMUSCULAR | Status: DC | PRN
  Filled 2012-08-22: qty 10

## 2012-08-22 MED ORDER — HEPARIN SOD (PORK) LOCK FLUSH 100 UNIT/ML IV SOLN
500.0000 [IU] | Freq: Once | INTRAVENOUS | Status: DC
  Filled 2012-08-22: qty 5

## 2012-08-22 NOTE — Progress Notes (Signed)
[name] presented for Portacath access and flush. Proper placement of portacath confirmed by CXR. Portacath located lt chest wall accessed with  H 20 needle. Good blood return present. Portacath flushed with 20ml NS and 500U/5ml Heparin and needle removed intact. Procedure without incident. Patient tolerated procedure well.   

## 2012-08-23 LAB — CEA: CEA: 1.9 ng/mL (ref 0.0–5.0)

## 2012-09-07 ENCOUNTER — Encounter (HOSPITAL_COMMUNITY): Payer: Self-pay

## 2012-09-07 ENCOUNTER — Inpatient Hospital Stay (HOSPITAL_COMMUNITY)
Admission: EM | Admit: 2012-09-07 | Discharge: 2012-09-11 | DRG: 180 | Disposition: A | Discharge status: Home / Self Care | Payer: Federal, State, Local not specified - PPO | Attending: General Surgery | Admitting: General Surgery

## 2012-09-07 ENCOUNTER — Emergency Department (HOSPITAL_COMMUNITY): Payer: Federal, State, Local not specified - PPO

## 2012-09-07 DIAGNOSIS — D649 Anemia, unspecified: Secondary | ICD-10-CM | POA: Diagnosis present

## 2012-09-07 DIAGNOSIS — M129 Arthropathy, unspecified: Secondary | ICD-10-CM | POA: Diagnosis present

## 2012-09-07 DIAGNOSIS — F17200 Nicotine dependence, unspecified, uncomplicated: Secondary | ICD-10-CM | POA: Diagnosis present

## 2012-09-07 DIAGNOSIS — Z79899 Other long term (current) drug therapy: Secondary | ICD-10-CM

## 2012-09-07 DIAGNOSIS — M810 Age-related osteoporosis without current pathological fracture: Secondary | ICD-10-CM | POA: Diagnosis present

## 2012-09-07 DIAGNOSIS — K2100 Gastro-esophageal reflux disease with esophagitis, without bleeding: Secondary | ICD-10-CM | POA: Diagnosis present

## 2012-09-07 DIAGNOSIS — I10 Essential (primary) hypertension: Secondary | ICD-10-CM | POA: Diagnosis present

## 2012-09-07 DIAGNOSIS — Z86711 Personal history of pulmonary embolism: Secondary | ICD-10-CM

## 2012-09-07 DIAGNOSIS — G8929 Other chronic pain: Secondary | ICD-10-CM | POA: Diagnosis present

## 2012-09-07 DIAGNOSIS — Z85038 Personal history of other malignant neoplasm of large intestine: Secondary | ICD-10-CM

## 2012-09-07 DIAGNOSIS — F411 Generalized anxiety disorder: Secondary | ICD-10-CM | POA: Diagnosis present

## 2012-09-07 DIAGNOSIS — E538 Deficiency of other specified B group vitamins: Secondary | ICD-10-CM | POA: Diagnosis present

## 2012-09-07 DIAGNOSIS — K56609 Unspecified intestinal obstruction, unspecified as to partial versus complete obstruction: Secondary | ICD-10-CM

## 2012-09-07 DIAGNOSIS — G4730 Sleep apnea, unspecified: Secondary | ICD-10-CM | POA: Diagnosis present

## 2012-09-07 DIAGNOSIS — Z903 Acquired absence of stomach [part of]: Secondary | ICD-10-CM

## 2012-09-07 LAB — CBC WITH DIFFERENTIAL/PLATELET
Basophils Absolute: 0 10*3/uL (ref 0.0–0.1)
Basophils Relative: 0 % (ref 0–1)
HCT: 38.3 % — ABNORMAL LOW (ref 39.0–52.0)
Lymphocytes Relative: 18 % (ref 12–46)
MCHC: 33.9 g/dL (ref 30.0–36.0)
Monocytes Absolute: 0.8 10*3/uL (ref 0.1–1.0)
Neutro Abs: 5.7 10*3/uL (ref 1.7–7.7)
Neutrophils Relative %: 72 % (ref 43–77)
Platelets: 220 10*3/uL (ref 150–400)
RDW: 16 % — ABNORMAL HIGH (ref 11.5–15.5)
WBC: 8 10*3/uL (ref 4.0–10.5)

## 2012-09-07 LAB — COMPREHENSIVE METABOLIC PANEL
ALT: 11 U/L (ref 0–53)
AST: 18 U/L (ref 0–37)
Albumin: 3.5 g/dL (ref 3.5–5.2)
CO2: 25 mEq/L (ref 19–32)
Chloride: 102 mEq/L (ref 96–112)
Creatinine, Ser: 0.9 mg/dL (ref 0.50–1.35)
GFR calc non Af Amer: >90 mL/min (ref >90)
Potassium: 3.3 mEq/L — ABNORMAL LOW (ref 3.5–5.1)
Sodium: 139 mEq/L (ref 135–145)
Total Bilirubin: 0.2 mg/dL — ABNORMAL LOW (ref 0.3–1.2)

## 2012-09-07 LAB — URINALYSIS, ROUTINE W REFLEX MICROSCOPIC
Glucose, UA: 250 mg/dL — AB
Hgb urine dipstick: NEGATIVE
Leukocytes, UA: NEGATIVE
pH: 6 (ref 5.0–8.0)

## 2012-09-07 LAB — LIPASE, BLOOD: Lipase: 31 U/L (ref 11–59)

## 2012-09-07 MED ORDER — HYDROMORPHONE HCL PF 1 MG/ML IJ SOLN
1.0000 mg | Freq: Once | INTRAMUSCULAR | Status: AC
  Administered 2012-09-07: 1 mg via INTRAVENOUS
  Filled 2012-09-07: qty 1

## 2012-09-07 MED ORDER — HYDROMORPHONE HCL PF 1 MG/ML IJ SOLN
1.0000 mg | Freq: Once | INTRAMUSCULAR | Status: AC
Start: 2012-09-07 — End: 2012-09-07
  Administered 2012-09-07: 1 mg via INTRAVENOUS
  Filled 2012-09-07: qty 1

## 2012-09-07 MED ORDER — ONDANSETRON HCL 4 MG/2ML IJ SOLN
4.0000 mg | Freq: Once | INTRAMUSCULAR | Status: AC
  Administered 2012-09-07: 4 mg via INTRAVENOUS
  Filled 2012-09-07: qty 2

## 2012-09-07 MED ORDER — HYDROMORPHONE HCL PF 1 MG/ML IJ SOLN
1.0000 mg | INTRAMUSCULAR | Status: AC | PRN
  Administered 2012-09-07 – 2012-09-08 (×2): 1 mg via INTRAVENOUS
  Filled 2012-09-07 (×2): qty 1

## 2012-09-07 MED ORDER — SODIUM CHLORIDE 0.9 % IV BOLUS (SEPSIS)
1000.0000 mL | Freq: Once | INTRAVENOUS | Status: AC
  Administered 2012-09-07: 1000 mL via INTRAVENOUS

## 2012-09-07 MED ORDER — SODIUM CHLORIDE 0.9 % IV SOLN
INTRAVENOUS | Status: AC
  Administered 2012-09-07: 22:00:00 via INTRAVENOUS

## 2012-09-07 MED ORDER — ONDANSETRON HCL 4 MG/2ML IJ SOLN
4.0000 mg | Freq: Three times a day (TID) | INTRAMUSCULAR | Status: DC | PRN
  Administered 2012-09-07: 4 mg via INTRAVENOUS
  Filled 2012-09-07: qty 2

## 2012-09-07 NOTE — ED Provider Notes (Signed)
History  This chart was scribed for [name] Roller, MD by Manuela Schwartz, ED scribe. This patient was seen in room [facility] and the patient's care was started at 1534.   CSN: [id number]  Arrival date & time 09/07/12  1534   First MD Initiated Contact with Patient 09/07/12 1612      Chief Complaint  Patient presents with  . Abdominal Pain   Patient is a 62 y.o. male presenting with abdominal pain. The history is provided by the patient. No language interpreter was used.  Abdominal Pain This is a new problem. The current episode started 6 to 12 hours ago. The problem occurs constantly. The problem has been gradually worsening. Associated symptoms include chest pain (lower substernal pain), abdominal pain and shortness of breath. Nothing aggravates the symptoms. Nothing relieves the symptoms. He has tried nothing for the symptoms.   HPI Comments: [name] is a 62 y.o. male who presents to the Emergency Department complaining of constant diffuse upper abdominal pain and substernal pain w/SOB since this AM and throughout the day new onset of nausea, and emesis. He states this AM, x1 episode of diarrhea, no blood in his stool. He states associated SOB and pain w/deep breaths. He states his abdomen currently feels bloated but he is passing gas. He denies fever, chills, cough, leg swelling/pain. He is prohalactically taking lovonox 60 mg once per day for X2 previous PE; he denies coumadin use. He also describes hx of colon CA from 2012 treated by partial colon resection and radiation treatment which ended about year and a half ago. He states has been cancer free since and recently had a coloscopy/biopsy in May showing pre cancerous polyps. He has a hx of recurrent bowel obstructions and ulcers.    Past Medical History  Diagnosis Date  . Pulmonary embolism 02/2010  . Hypertension   . Osteoporosis   . Arthritis   . TIA (transient ischemic attack) 10/11  . ETOH abuse     quit 03/2010  . S/P  partial gastrectomy 1980s  . Personal history of PE (pulmonary embolism) 10/01/2010  . Blood transfusion   . S/P endoscopy September 28, 2010    erosive reflux esophagitis, Billroth I anatomy  . Shortness of breath   . Sleep apnea   . Recurrent upper respiratory infection (URI)   . Anxiety   . Pneumonia   . Anemia   . Ileus   . Chronic abdominal pain   . GERD (gastroesophageal reflux disease)   . Chest pain at rest   . Erosive esophagitis   . 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 Vitamin B12 deficiency   . Bowel obstruction 05/13/2012    Recurrent  . Hx of Clostridium difficile infection 01/2012    Past Surgical History  Procedure Laterality Date  . Hernia repair      right inguinal  . Appendectomy  1980s  . Cholecystectomy  1980s  . Colon surgery  May 2012    left hemicolectomy, colon cancer found at time of surgery for bowel obstruction  . Portacath placement    . Abdominal sugery      for bowel obstruction x 8, all in 1980s, except for one in 07/2010  . Esophagogastroduodenoscopy  09/28/2010  . Esophagogastroduodenoscopy  12/01/2010    Cervical web status post dilation, erosive esophagitis, B1 hemigastrectomy, inflamed anastomosis  . Colonoscopy  03/18/2011    anastomosis at 35cm. Several adenomatous polyps removed. Sigmoid diverticulosis.  Next TCS 02/2013  . Esophagogastroduodenoscopy  04/16/2011    excoriation at GEJ c/w trauma/M-W tear, friable gastric anastomosis, dilation efferent limb  . Billroth 1 hemigastrectomy  1980s    per patient for benign duodenal tumor  . Esophagogastroduodenoscopy (egd) with esophageal dilation  02/25/2012    AVW:[id number] esophageal web-s/p dilation anddisruption as described above. Status post prior gastric with Billroth I configuration. Abnormal gastric mucosa at the anastomosis. Gastric biopsy showed mild chronic inflammation but no H. pylori   . Cardiac catheterization  07/17/2012  . Colonoscopy N/A 07/24/2012     Procedure: COLONOSCOPY;  Surgeon: Corbin Ade, MD;  Location: [facility];  Service: Endoscopy;  Laterality: N/A;  3:00    Family History  Problem Relation Age of Onset  . Hypertension Mother   . Arthritis Mother   . Pneumonia Mother   . Hypertension Father   . Heart attack Father     History  Substance Use Topics  . Smoking status: Current Every Day Smoker -- 0.50 packs/day for 40 years    Types: Cigarettes  . Smokeless tobacco: Never Used  . Alcohol Use: No     Comment: last drink was Apr 16 2010      Review of Systems  Constitutional: Positive for fatigue. Negative for fever and chills.  Respiratory: Positive for shortness of breath. Negative for cough.   Cardiovascular: Positive for chest pain (lower substernal pain).  Gastrointestinal: Positive for abdominal pain. Negative for nausea and vomiting.  Genitourinary: Negative for difficulty urinating.  Musculoskeletal: Negative for back pain.  Neurological: Negative for weakness.  All other systems reviewed and are negative.   A complete 10 system review of systems was obtained and all systems are negative except as noted in the HPI and PMH.   Allergies  Review of patient's allergies indicates no known allergies.  Home Medications   Current Outpatient Rx  Name  Route  Sig  Dispense  Refill  . alendronate (FOSAMAX) 70 MG tablet   Oral   Take 70 mg by mouth every 7 (seven) days. On sundays         . atorvastatin (LIPITOR) 20 MG tablet   Oral   Take 20 mg by mouth at bedtime.          . cholecalciferol (VITAMIN D) 1000 UNITS tablet   Oral   Take 1,000 Units by mouth every morning.         . cyanocobalamin (,VITAMIN B-12,) 1000 MCG/ML injection   Intramuscular   Inject 1,000 mcg into the muscle every 30 (thirty) days.         . cyclobenzaprine (FLEXERIL) 10 MG tablet   Oral   Take 10 mg by mouth daily as needed for muscle spasms.          [name] dexlansoprazole (DEXILANT) 60 MG capsule   Oral    Take 60 mg by mouth every morning.         . docusate sodium (COLACE) 100 MG capsule   Oral   Take 100 mg by mouth 2 (two) times daily.         [name] enoxaparin (LOVENOX) 60 MG/0.6ML injection   Subcutaneous   Inject 60 mg into the skin every evening.          . ferrous sulfate 325 (65 FE) MG tablet   Oral   Take 325 mg by mouth 2 (two) times daily.         . folic acid (FOLVITE) 1 MG  tablet   Oral   Take 1 mg by mouth every morning.          [name] LORazepam (ATIVAN) 1 MG tablet   Oral   Take 1 mg by mouth every 3 (three) hours as needed. anxiety         . ondansetron (ZOFRAN) 4 MG tablet   Oral   Take 4 mg by mouth daily as needed for nausea.         . polyethylene glycol powder (GLYCOLAX/MIRALAX) powder   Oral   Take 17 g by mouth as needed. For constipation         . prochlorperazine (COMPAZINE) 10 MG tablet   Oral   Take 10 mg by mouth every 6 (six) hours as needed. Nausea and Vomiting         . psyllium (REGULOID) 0.52 G capsule   Oral   Take 2.6 g by mouth 2 (two) times daily. *5 capsules taken twice daily*         . sertraline (ZOLOFT) 100 MG tablet   Oral   Take 100 mg by mouth at bedtime.         . sucralfate (CARAFATE) 1 GM/10ML suspension   Oral   Take 1 g by mouth 4 (four) times daily as needed.         . thiamine 100 MG tablet   Oral   Take 100 mg by mouth every morning.          . traMADol (ULTRAM) 50 MG tablet   Oral   Take 1-2 tablets (50-100 mg total) by mouth every 4 (four) hours as needed. For pain   30 tablet   1   . valsartan-hydrochlorothiazide (DIOVAN-HCT) 80-12.5 MG per tablet   Oral   Take 1 tablet by mouth every morning.          . Vitamin D, Ergocalciferol, (DRISDOL) 50000 UNITS CAPS   Oral   Take 50,000 Units by mouth once a week. Takes on Sunday         . Nutritional Supplements (JUICE PLUS FIBRE) LIQD   Oral   Take 1 Can by mouth daily.           Triage Vitals: BP 160/90  Pulse 96  Temp(Src)  97.1 F (36.2 C) (Oral)  Resp 18  SpO2 100%  Physical Exam  Nursing note and vitals reviewed. Constitutional: He is oriented to person, place, and time. He appears well-developed and well-nourished. No distress.  HENT:  Head: Normocephalic and atraumatic.  Mouth/Throat: Oropharynx is clear and moist.  Eyes: Conjunctivae and EOM are normal. No scleral icterus.  No scleral icterus  Neck: Neck supple. No JVD present. No tracheal deviation present.  No lymphadenopathy  Cardiovascular: Normal rate, regular rhythm and normal heart sounds.   No murmur heard. Strong pulses at radial arteries  Pulmonary/Chest: Effort normal and breath sounds normal. No respiratory distress. He has no wheezes.  Port Cath in left upper chest  Abdominal: Soft. He exhibits no distension. There is tenderness.  Well healed long midline surgical scar Increased bowel sounds Diffuse tympanitic sounds to percussion Tenderness left upper and left mid abdomen No peritoneal sings, soft abdomen   Musculoskeletal: Normal range of motion. He exhibits no edema and no tenderness.  No CVA tenderness  Lymphadenopathy:    He has no cervical adenopathy.  Neurological: He is alert and oriented to person, place, and time.  Skin: Skin is warm and dry.  Psychiatric: He has  a normal mood and affect. His behavior is normal.    ED Course  Procedures (including critical care time) DIAGNOSTIC STUDIES: Oxygen Saturation is 100% on room air, normal by my interpretation.    ED ECG REPORT  I personally interpreted this EKG   Date: 09/07/2012   Rate: 80  Rhythm: normal sinus rhythm  QRS Axis: normal  Intervals: normal  ST/T Wave abnormalities: normal  Conduction Disutrbances:none  Narrative Interpretation:   Old EKG Reviewed: c/w 09/02/11, no sig changes - rate slower today   COORDINATION OF CARE: At 425 PM Discussed treatment plan with patient which includes CXR, pain medicine, zofran, abdominal X-ray, blood work, UA,  cardiac markers, EKG. Patient agrees.   Labs Reviewed  CBC WITH DIFFERENTIAL - Abnormal; Notable for the following:    HCT 38.3 (*)    RDW 16.0 (*)    All other components within normal limits  COMPREHENSIVE METABOLIC PANEL - Abnormal; Notable for the following:    Potassium 3.3 (*)    Glucose, Bld 110 (*)    Total Bilirubin 0.2 (*)    All other components within normal limits  URINALYSIS, ROUTINE W REFLEX MICROSCOPIC - Abnormal; Notable for the following:    Glucose, UA 250 (*)    Ketones, ur TRACE (*)    All other components within normal limits  TROPONIN I  LIPASE, BLOOD   Dg Abd Acute W/chest  09/07/2012   *RADIOLOGY REPORT*  Clinical Data: Left upper abdominal pain.  ACUTE ABDOMEN SERIES (ABDOMEN 2 VIEW & CHEST 1 VIEW)  Comparison: 06/28/2012  Findings: Left subclavian port catheter remains with its tip in the proximal SVC.  Lungs clear.  Heart size normal.  Tortuous thoracic aorta.  No effusion.  Multiple vascular clips in the upper and left mid abdomen.  Multiple dilated small bowel loops in mid abdomen with fluid levels on the erect radiograph. No free air.  Moderate gas and fecal material throughout the colon and rectum.  Regional bones unremarkable.  Anastomotic staple line in the left lower quadrant.  IMPRESSION:  1. Suspect early/partial distal small bowel obstruction. 2.  No free air. 3.  No acute cardiopulmonary disease.   Original Report Authenticated By: D. Andria Rhein, MD     1. SBO (small bowel obstruction)       MDM  The pt has sig hx of both PE and SBO but with his n/v and abd exam with diffuse tympanitic sounds to percussion and LUQ ttp, I would lean more towards SBO recurrence than PE - especially as he is on lovenox daily and not having hypoxia, tachycardia or other abnormalities.  AAS and labs pending.  Care was discussed with the general surgeon, Dr. Leticia Penna, who has accepted the patient to his service. Lab work is normal, I have personally reviewed and  interpreted the x-rays of the acute abdominal series and find this to be consistent with a small bowel obstruction. The patient will have an NG tube placed by nursing, to be admitted.   I personally performed the services described in this documentation, which was scribed in my presence. The recorded information has been reviewed and is accurate.          [name] Roller, MD 09/07/12 [phone]

## 2012-09-07 NOTE — ED Notes (Signed)
Report attempted, nurse still in report, will call when done.

## 2012-09-07 NOTE — ED Notes (Addendum)
Admission room received, nurse unavailable at present for report.

## 2012-09-07 NOTE — ED Notes (Signed)
Pt reports abd pain that began this a.m.  Pt reports n/v/d that began a few hours ago. Pt reports pain to the left side of his abdomen.

## 2012-09-08 LAB — CBC
HCT: 35.9 % — ABNORMAL LOW (ref 39.0–52.0)
Hemoglobin: 11.6 g/dL — ABNORMAL LOW (ref 13.0–17.0)
MCH: 29.2 pg (ref 26.0–34.0)
MCHC: 32.3 g/dL (ref 30.0–36.0)
RDW: 16.2 % — ABNORMAL HIGH (ref 11.5–15.5)

## 2012-09-08 LAB — BASIC METABOLIC PANEL
BUN: 10 mg/dL (ref 6–23)
Chloride: 107 mEq/L (ref 96–112)
Creatinine, Ser: 0.85 mg/dL (ref 0.50–1.35)
GFR calc Af Amer: >90 mL/min (ref >90)
Glucose, Bld: 94 mg/dL (ref 70–99)
Potassium: 3.6 mEq/L (ref 3.5–5.1)

## 2012-09-08 MED ORDER — ONDANSETRON HCL 4 MG/2ML IJ SOLN
4.0000 mg | Freq: Four times a day (QID) | INTRAMUSCULAR | Status: DC | PRN
  Administered 2012-09-08 – 2012-09-09 (×5): 4 mg via INTRAVENOUS
  Filled 2012-09-08 (×4): qty 2

## 2012-09-08 MED ORDER — LACTATED RINGERS IV SOLN
INTRAVENOUS | Status: DC
  Administered 2012-09-08 – 2012-09-11 (×9): via INTRAVENOUS

## 2012-09-08 MED ORDER — MORPHINE SULFATE 2 MG/ML IJ SOLN
1.0000 mg | INTRAMUSCULAR | Status: DC | PRN
  Administered 2012-09-08 (×2): 1 mg via INTRAVENOUS
  Administered 2012-09-08: 4 mg via INTRAVENOUS
  Administered 2012-09-08: 1 mg via INTRAVENOUS
  Administered 2012-09-09 (×2): 2 mg via INTRAVENOUS
  Administered 2012-09-09: 1 mg via INTRAVENOUS
  Administered 2012-09-09 (×2): 2 mg via INTRAVENOUS
  Administered 2012-09-10: 4 mg via INTRAVENOUS
  Administered 2012-09-10 – 2012-09-11 (×6): 2 mg via INTRAVENOUS
  Filled 2012-09-08 (×3): qty 1
  Filled 2012-09-08: qty 2
  Filled 2012-09-08 (×12): qty 1

## 2012-09-08 MED ORDER — PANTOPRAZOLE SODIUM 40 MG IV SOLR
40.0000 mg | Freq: Every day | INTRAVENOUS | Status: DC
  Administered 2012-09-08 – 2012-09-10 (×4): 40 mg via INTRAVENOUS
  Filled 2012-09-08 (×4): qty 40

## 2012-09-08 MED ORDER — ENOXAPARIN SODIUM 40 MG/0.4ML ~~LOC~~ SOLN
40.0000 mg | SUBCUTANEOUS | Status: DC
  Administered 2012-09-08 – 2012-09-11 (×4): 40 mg via SUBCUTANEOUS
  Filled 2012-09-08 (×4): qty 0.4

## 2012-09-08 NOTE — Progress Notes (Signed)
UR chart review completed.  

## 2012-09-08 NOTE — H&P (Signed)
[name] is an 62 y.o. male.   Chief Complaint: Nausea vomiting abdominal pain HPI: Patient presented to [facility] with worsening nausea vomiting and diffuse colicky abdominal pain. He states is identical to previous bouts of small bowel obstructions. He are likely did have a small bowel movement yesterday which he reports is somewhat firm. No melena or hematochezia. Since that episode he is not passing flatus or had any additional bowel movements. His emesis had been nonbloody. He denies any fevers but has had some subjective chills. No sick contacts. Due to his persistent nausea in the emergency department a nasogastric tube was ordered by the emergency room physician. The patient actually [name] nasogastric tube by himself upon request by the patient. Patient states symptoms are slightly improved. He still having nausea. Still having some diffuse colicky abdominal discomfort.  Past Medical History  Diagnosis Date  . Pulmonary embolism 02/2010  . Hypertension   . Osteoporosis   . Arthritis   . TIA (transient ischemic attack) 10/11  . ETOH abuse     quit 03/2010  . 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 Personal history of PE (pulmonary embolism) 10/01/2010  . Blood transfusion   . S/P endoscopy September 28, 2010    erosive reflux esophagitis, Billroth I anatomy  . Shortness of breath   . Sleep apnea   . Recurrent upper respiratory infection (URI)   . Anxiety   . Pneumonia   . Anemia   . Ileus   . Chronic abdominal pain   . GERD (gastroesophageal reflux disease)   . Chest pain at rest   . Erosive esophagitis   . 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 Vitamin B12 deficiency   . Bowel obstruction 05/13/2012    Recurrent  . Hx of Clostridium difficile infection 01/2012    Past Surgical History  Procedure Laterality Date  . Hernia repair      right inguinal  . Appendectomy  1980s  . Cholecystectomy  1980s  . Colon surgery  May 2012    left  hemicolectomy, colon cancer found at time of surgery for bowel obstruction  . Portacath placement    . Abdominal sugery      for bowel obstruction x 8, all in 1980s, except for one in 07/2010  . Esophagogastroduodenoscopy  09/28/2010  . Esophagogastroduodenoscopy  12/01/2010    Cervical web status post dilation, erosive esophagitis, B1 hemigastrectomy, inflamed anastomosis  . Colonoscopy  03/18/2011    anastomosis at 35cm. Several adenomatous polyps removed. Sigmoid diverticulosis. Next TCS 02/2013  . Esophagogastroduodenoscopy  04/16/2011    excoriation at GEJ c/w trauma/M-W tear, friable gastric anastomosis, dilation efferent limb  . Billroth 1 hemigastrectomy  1980s    per patient for benign duodenal tumor  . Esophagogastroduodenoscopy (egd) with esophageal dilation  02/25/2012    [id number] esophageal web-s/p dilation anddisruption as described above. Status post prior gastric with Billroth I configuration. Abnormal gastric mucosa at the anastomosis. Gastric biopsy showed mild chronic inflammation but no H. pylori   . Cardiac catheterization  07/17/2012  . Colonoscopy N/A 07/24/2012    Procedure: COLONOSCOPY;  Surgeon: Corbin Ade, MD;  Location: [facility];  Service: Endoscopy;  Laterality: N/A;  3:00    Family History  Problem Relation Age of Onset  . Hypertension Mother   . Arthritis Mother   . Pneumonia Mother   . Hypertension Father   . Heart attack Father    Social  History:  reports that he has been smoking Cigarettes.  He has a 20 pack-year smoking history. He has never used smokeless tobacco. He reports that he does not drink alcohol or use illicit drugs.  Allergies: No Known Allergies  Medications Prior to Admission  Medication Sig Dispense Refill  . alendronate (FOSAMAX) 70 MG tablet Take 70 mg by mouth every 7 (seven) days. On sundays      . atorvastatin (LIPITOR) 20 MG tablet Take 20 mg by mouth at bedtime.       . cholecalciferol (VITAMIN D) 1000 UNITS tablet Take  1,000 Units by mouth every morning.      . cyanocobalamin (,VITAMIN B-12,) 1000 MCG/ML injection Inject 1,000 mcg into the muscle every 30 (thirty) days.      . cyclobenzaprine (FLEXERIL) 10 MG tablet Take 10 mg by mouth daily as needed for muscle spasms.       [name] dexlansoprazole (DEXILANT) 60 MG capsule Take 60 mg by mouth every morning.      . docusate sodium (COLACE) 100 MG capsule Take 100 mg by mouth 2 (two) times daily.      [name] enoxaparin (LOVENOX) 60 MG/0.6ML injection Inject 60 mg into the skin every evening.       . ferrous sulfate 325 (65 FE) MG tablet Take 325 mg by mouth 2 (two) times daily.      . folic acid (FOLVITE) 1 MG tablet Take 1 mg by mouth every morning.       [name] LORazepam (ATIVAN) 1 MG tablet Take 1 mg by mouth every 3 (three) hours as needed. anxiety      . ondansetron (ZOFRAN) 4 MG tablet Take 4 mg by mouth daily as needed for nausea.      . polyethylene glycol powder (GLYCOLAX/MIRALAX) powder Take 17 g by mouth as needed. For constipation      . prochlorperazine (COMPAZINE) 10 MG tablet Take 10 mg by mouth every 6 (six) hours as needed. Nausea and Vomiting      . psyllium (REGULOID) 0.52 G capsule Take 2.6 g by mouth 2 (two) times daily. *5 capsules taken twice daily*      . sertraline (ZOLOFT) 100 MG tablet Take 100 mg by mouth at bedtime.      . sucralfate (CARAFATE) 1 GM/10ML suspension Take 1 g by mouth 4 (four) times daily as needed.      . thiamine 100 MG tablet Take 100 mg by mouth every morning.       . traMADol (ULTRAM) 50 MG tablet Take 1-2 tablets (50-100 mg total) by mouth every 4 (four) hours as needed. For pain  30 tablet  1  . valsartan-hydrochlorothiazide (DIOVAN-HCT) 80-12.5 MG per tablet Take 1 tablet by mouth every morning.       . Vitamin D, Ergocalciferol, (DRISDOL) 50000 UNITS CAPS Take 50,000 Units by mouth once a week. Takes on Sunday      . Nutritional Supplements (JUICE PLUS FIBRE) LIQD Take 1 Can by mouth daily.        Results for orders placed  during the hospital encounter of 09/07/12 (from the past 48 hour(s))  URINALYSIS, ROUTINE W REFLEX MICROSCOPIC     Status: Abnormal   Collection Time    09/07/12  4:36 PM      Result Value Range   Color, Urine YELLOW  YELLOW   APPearance CLEAR  CLEAR   Specific Gravity, Urine 1.025  1.005 - 1.030   pH 6.0  5.0 - 8.0  Glucose, UA 250 (*) NEGATIVE mg/dL   Hgb urine dipstick NEGATIVE  NEGATIVE   Bilirubin Urine NEGATIVE  NEGATIVE   Ketones, ur TRACE (*) NEGATIVE mg/dL   Protein, ur NEGATIVE  NEGATIVE mg/dL   Urobilinogen, UA 0.2  0.0 - 1.0 mg/dL   Nitrite NEGATIVE  NEGATIVE   Leukocytes, UA NEGATIVE  NEGATIVE   Comment: MICROSCOPIC NOT DONE ON URINES WITH NEGATIVE PROTEIN, BLOOD, LEUKOCYTES, NITRITE, OR GLUCOSE <1000 mg/dL.  CBC WITH DIFFERENTIAL     Status: Abnormal   Collection Time    09/07/12  4:45 PM      Result Value Range   WBC 8.0  4.0 - 10.5 K/uL   RBC 4.29  4.22 - 5.81 MIL/uL   Hemoglobin 13.0  13.0 - 17.0 g/dL   HCT 78.4 (*) 69.6 - 29.5 %   MCV 89.3  78.0 - 100.0 fL   MCH 30.3  26.0 - 34.0 pg   MCHC 33.9  30.0 - 36.0 g/dL   RDW 28.4 (*) 13.2 - 44.0 %   Platelets 220  150 - 400 K/uL   Neutrophils Relative % 72  43 - 77 %   Neutro Abs 5.7  1.7 - 7.7 K/uL   Lymphocytes Relative 18  12 - 46 %   Lymphs Abs 1.4  0.7 - 4.0 K/uL   Monocytes Relative 10  3 - 12 %   Monocytes Absolute 0.8  0.1 - 1.0 K/uL   Eosinophils Relative 0  0 - 5 %   Eosinophils Absolute 0.0  0.0 - 0.7 K/uL   Basophils Relative 0  0 - 1 %   Basophils Absolute 0.0  0.0 - 0.1 K/uL  COMPREHENSIVE METABOLIC PANEL     Status: Abnormal   Collection Time    09/07/12  4:45 PM      Result Value Range   Sodium 139  135 - 145 mEq/L   Potassium 3.3 (*) 3.5 - 5.1 mEq/L   Chloride 102  96 - 112 mEq/L   CO2 25  19 - 32 mEq/L   Glucose, Bld 110 (*) 70 - 99 mg/dL   BUN 12  6 - 23 mg/dL   Creatinine, Ser 1.02  0.50 - 1.35 mg/dL   Calcium 8.7  8.4 - 72.5 mg/dL   Total Protein 6.7  6.0 - 8.3 g/dL   Albumin 3.5   3.5 - 5.2 g/dL   AST 18  0 - 37 U/L   ALT 11  0 - 53 U/L   Alkaline Phosphatase 78  39 - 117 U/L   Total Bilirubin 0.2 (*) 0.3 - 1.2 mg/dL   GFR calc non Af Amer >90  >90 mL/min   GFR calc Af Amer >90  >90 mL/min   Comment:            The eGFR has been calculated     using the CKD EPI equation.     This calculation has not been     validated in all clinical     situations.     eGFR's persistently     <90 mL/min signify     possible Chronic Kidney Disease.  TROPONIN I     Status: None   Collection Time    09/07/12  4:45 PM      Result Value Range   Troponin I <0.30  <0.30 ng/mL   Comment:            Due to the release kinetics of cTnI,  a negative result within the first hours     of the onset of symptoms does not rule out     myocardial infarction with certainty.     If myocardial infarction is still suspected,     repeat the test at appropriate intervals.  LIPASE, BLOOD     Status: None   Collection Time    09/07/12  4:45 PM      Result Value Range   Lipase 31  11 - 59 U/L  BASIC METABOLIC PANEL     Status: Abnormal   Collection Time    09/08/12  5:14 AM      Result Value Range   Sodium 141  135 - 145 mEq/L   Potassium 3.6  3.5 - 5.1 mEq/L   Chloride 107  96 - 112 mEq/L   CO2 27  19 - 32 mEq/L   Glucose, Bld 94  70 - 99 mg/dL   BUN 10  6 - 23 mg/dL   Creatinine, Ser 1.61  0.50 - 1.35 mg/dL   Calcium 7.8 (*) 8.4 - 10.5 mg/dL   GFR calc non Af Amer >90  >90 mL/min   GFR calc Af Amer >90  >90 mL/min   Comment:            The eGFR has been calculated     using the CKD EPI equation.     This calculation has not been     validated in all clinical     situations.     eGFR's persistently     <90 mL/min signify     possible Chronic Kidney Disease.  CBC     Status: Abnormal   Collection Time    09/08/12  5:14 AM      Result Value Range   WBC 7.1  4.0 - 10.5 K/uL   RBC 3.97 (*) 4.22 - 5.81 MIL/uL   Hemoglobin 11.6 (*) 13.0 - 17.0 g/dL   HCT 09.6 (*) 04.5 - 40.9  %   MCV 90.4  78.0 - 100.0 fL   MCH 29.2  26.0 - 34.0 pg   MCHC 32.3  30.0 - 36.0 g/dL   RDW 81.1 (*) 91.4 - 78.2 %   Platelets    150 - 400 K/uL   Value: PLATELET CLUMPS NOTED ON SMEAR, COUNT APPEARS ADEQUATE   Dg Abd Acute W/chest  09/07/2012   *RADIOLOGY REPORT*  Clinical Data: Left upper abdominal pain.  ACUTE ABDOMEN SERIES (ABDOMEN 2 VIEW & CHEST 1 VIEW)  Comparison: 06/28/2012  Findings: Left subclavian port catheter remains with its tip in the proximal SVC.  Lungs clear.  Heart size normal.  Tortuous thoracic aorta.  No effusion.  Multiple vascular clips in the upper and left mid abdomen.  Multiple dilated small bowel loops in mid abdomen with fluid levels on the erect radiograph. No free air.  Moderate gas and fecal material throughout the colon and rectum.  Regional bones unremarkable.  Anastomotic staple line in the left lower quadrant.  IMPRESSION:  1. Suspect early/partial distal small bowel obstruction. 2.  No free air. 3.  No acute cardiopulmonary disease.   Original Report Authenticated By: D. Andria Rhein, MD    Review of Systems  Constitutional: Positive for chills and diaphoresis. Negative for fever, weight loss and malaise/fatigue.  HENT: Negative.   Eyes: Negative.   Respiratory: Negative.   Cardiovascular: Negative.   Gastrointestinal: Positive for heartburn, nausea, vomiting, abdominal pain and constipation. Negative for diarrhea, blood in stool  and melena.  Genitourinary: Negative.   Musculoskeletal: Negative.   Skin: Negative.   Neurological: Negative.  Negative for weakness.  Endo/Heme/Allergies: Negative.   Psychiatric/Behavioral: Negative.     Blood pressure 151/77, pulse 54, temperature 97.9 F (36.6 C), temperature source Oral, resp. rate 17, height 5\' 9"  (1.753 m), weight 65.2 kg (143 lb 11.8 oz), SpO2 99.00%. Physical Exam  Constitutional: He is oriented to person, place, and time. He appears well-developed and well-nourished. No distress.  HENT:  Head:  Normocephalic and atraumatic.  Eyes: Conjunctivae and EOM are normal. Pupils are equal, round, and reactive to light. No scleral icterus.  Neck: Normal range of motion. Neck supple. No thyromegaly present.  Cardiovascular: Normal rate, regular rhythm and normal heart sounds.   Respiratory: Effort normal and breath sounds normal.  GI: Soft. Bowel sounds are normal. He exhibits no distension and no mass. There is tenderness (moderate diffuse tenderness, no peritoneal). There is no rebound and no guarding.  Lymphadenopathy:    He has no cervical adenopathy.  Neurological: He is alert and oriented to person, place, and time.  Skin: Skin is warm and dry.     Assessment/Plan Small bowel obstruction. Anticipate this is either an early complete bowel obstruction or a partial bowel obstruction. Again I had a long discussion with the patient regarding his surgical history and is high risk to proceed to the operating room. 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 He is agreeable to this time to continue conservative management. We'll continue IV fluid hydration. Continue nasogastric tube decompression. Continue bowel rest. Continue DVT prophylaxis. Additionally and delayed. Continue patient as an inpatient admission.  Raymundo Rout C 09/08/2012, 12:38 PM

## 2012-09-08 NOTE — Care Management Note (Signed)
    Page 1 of 1   09/11/2012     11:14:14 AM   CARE MANAGEMENT NOTE 09/11/2012  Patient:  [name], [name]   Account Number:  [id number]  Date Initiated:  09/08/2012  Documentation initiated by:  Sharrie Rothman  Subjective/Objective Assessment:   Pt admitted from home with SBO. Pt lives with his wife and will return home at discharge. Pt is independent with ADL's.     Action/Plan:   No CM needs noted.   Anticipated DC Date:  09/11/2012   Anticipated DC Plan:  HOME/SELF CARE      DC Planning Services  CM consult      Choice offered to / List presented to:             Status of service:  Completed, signed off Medicare Important Message given?   (If response is "NO", the following Medicare IM given date fields will be blank) Date Medicare IM given:   Date Additional Medicare IM given:    Discharge Disposition:    Per UR Regulation:    If discussed at Long Length of Stay Meetings, dates discussed:    Comments:  09/11/12 Rosemary Holms RN BSN CM No HH needs identified  09/08/12 1425  Arlyss Queen, Charity fundraiser BSN CM

## 2012-09-09 NOTE — Progress Notes (Signed)
  Subjective: Patient states he feels somewhat better. No nausea. Abdominal pain is minimal. He is passing flatus. No bowel movement.  Objective: Vital signs in last 24 hours: Temp:  [98.1 F (36.7 C)-99.4 F (37.4 C)] 98.1 F (36.7 C) (06/21 0620) Pulse Rate:  [57-67] 57 (06/21 0620) Resp:  [16-18] 16 (06/21 0620) BP: (128-152)/(66-82) 136/77 mmHg (06/21 0620) SpO2:  [97 %-99 %] 99 % (06/21 0620) Last BM Date: 09/07/12  Intake/Output from previous day: 06/20 0701 - 06/21 0700 In: 3352.3 [I.V.:3352.3] Out: 300 [Emesis/NG output:300] Intake/Output this shift:    General appearance: alert and no distress GI: soft, non-tender; bowel sounds normal; no masses,  no organomegaly  Lab Results:   Recent Labs  09/07/12 1645 09/08/12 0514  WBC 8.0 7.1  HGB 13.0 11.6*  HCT 38.3* 35.9*  PLT 220 PLATELET CLUMPS NOTED ON SMEAR, COUNT APPEARS ADEQUATE   BMET  Recent Labs  09/07/12 1645 09/08/12 0514  NA 139 141  K 3.3* 3.6  CL 102 107  CO2 25 27  GLUCOSE 110* 94  BUN 12 10  CREATININE 0.90 0.85  CALCIUM 8.7 7.8*   PT/INR No results found for this basename: LABPROT, INR,  in the last 72 hours ABG No results found for this basename: PHART, PCO2, PO2, HCO3,  in the last 72 hours  Studies/Results: Dg Abd Acute W/chest  09/07/2012   *RADIOLOGY REPORT*  Clinical Data: Left upper abdominal pain.  ACUTE ABDOMEN SERIES (ABDOMEN 2 VIEW & CHEST 1 VIEW)  Comparison: 06/28/2012  Findings: Left subclavian port catheter remains with its tip in the proximal SVC.  Lungs clear.  Heart size normal.  Tortuous thoracic aorta.  No effusion.  Multiple vascular clips in the upper and left mid abdomen.  Multiple dilated small bowel loops in mid abdomen with fluid levels on the erect radiograph. No free air.  Moderate gas and fecal material throughout the colon and rectum.  Regional bones unremarkable.  Anastomotic staple line in the left lower quadrant.  IMPRESSION:  1. Suspect early/partial  distal small bowel obstruction. 2.  No free air. 3.  No acute cardiopulmonary disease.   Original Report Authenticated By: D. Andria Rhein, MD    Anti-infectives: Anti-infectives   None      Assessment/Plan: s/p * No surgery found * I do suspect that this time the small bowel obstruction is improving. I will trial patient on a clear liquid diet with the nasogastric tube being clamped. She tolerated this the nasogastric tube can be discontinued later today. If his symptoms return in the interim we'll return the patient to lower and more suctioning return to ice chips only. Continue ambulation. Again I have discussed with the patient at length contraindications to proceeding to the operating room. Patient is extremely poor operative candidate given his history of a frozen abdomen and high risk of bowel injury.  LOS: 2 days    Galena Logie C 09/09/2012

## 2012-09-10 LAB — BASIC METABOLIC PANEL
CO2: 29 mEq/L (ref 19–32)
Creatinine, Ser: 0.81 mg/dL (ref 0.50–1.35)
Sodium: 143 mEq/L (ref 135–145)

## 2012-09-10 LAB — CBC
HCT: 33.5 % — ABNORMAL LOW (ref 39.0–52.0)
Hemoglobin: 11.2 g/dL — ABNORMAL LOW (ref 13.0–17.0)
MCH: 30.4 pg (ref 26.0–34.0)
MCHC: 33.4 g/dL (ref 30.0–36.0)
RDW: 15.6 % — ABNORMAL HIGH (ref 11.5–15.5)

## 2012-09-10 MED ORDER — MAGNESIUM HYDROXIDE 400 MG/5ML PO SUSP
30.0000 mL | Freq: Every day | ORAL | Status: DC
  Administered 2012-09-10 – 2012-09-11 (×2): 30 mL via ORAL
  Filled 2012-09-10 (×2): qty 30

## 2012-09-10 NOTE — Progress Notes (Signed)
  Subjective: No nausea. Tolerating clear liquids. Positive flatus. Abdominal pain improved. Feels much better.  Objective: Vital signs in last 24 hours: Temp:  [98.1 F (36.7 C)-99.1 F (37.3 C)] 98.1 F (36.7 C) (06/22 0607) Pulse Rate:  [54-83] 54 (06/22 0607) Resp:  [18] 18 (06/22 0607) BP: (128-141)/(69-80) 128/71 mmHg (06/22 0607) SpO2:  [98 %] 98 % (06/22 0607) Last BM Date: 09/07/12  Intake/Output from previous day: 06/21 0701 - 06/22 0700 In: 3115.4 [P.O.:480; I.V.:2635.4] Out: 1100 [Urine:1100] Intake/Output this shift:    General appearance: alert and no distress GI: soft, non-tender; bowel sounds normal; no masses,  no organomegaly  Lab Results:   Recent Labs  09/08/12 0514 09/10/12 0520  WBC 7.1 6.6  HGB 11.6* 11.2*  HCT 35.9* 33.5*  PLT PLATELET CLUMPS NOTED ON SMEAR, COUNT APPEARS ADEQUATE 139*   BMET  Recent Labs  09/08/12 0514 09/10/12 0520  NA 141 143  K 3.6 3.5  CL 107 107  CO2 27 29  GLUCOSE 94 93  BUN 10 6  CREATININE 0.85 0.81  CALCIUM 7.8* 7.7*   PT/INR No results found for this basename: LABPROT, INR,  in the last 72 hours ABG No results found for this basename: PHART, PCO2, PO2, HCO3,  in the last 72 hours  Studies/Results: No results found.  Anti-infectives: Anti-infectives   None      Assessment/Plan: s/p * No surgery found * Small bowel obstruction. Appears to be resolving. Continue to advance diet. We'll start some milk of magnesia. Possible discharge later today pending patient's continued progression. Definite discharge in the next 24 hours patient continues to proceed  LOS: 3 days    Audley Hinojos C 09/10/2012

## 2012-09-11 LAB — CBC
HCT: 34.4 % — ABNORMAL LOW (ref 39.0–52.0)
Hemoglobin: 11.4 g/dL — ABNORMAL LOW (ref 13.0–17.0)
MCV: 91 fL (ref 78.0–100.0)
RBC: 3.78 MIL/uL — ABNORMAL LOW (ref 4.22–5.81)
RDW: 15.7 % — ABNORMAL HIGH (ref 11.5–15.5)
WBC: 6.4 10*3/uL (ref 4.0–10.5)

## 2012-09-11 LAB — BASIC METABOLIC PANEL
BUN: 6 mg/dL (ref 6–23)
CO2: 28 mEq/L (ref 19–32)
Chloride: 104 mEq/L (ref 96–112)
Creatinine, Ser: 0.79 mg/dL (ref 0.50–1.35)
Glucose, Bld: 86 mg/dL (ref 70–99)
Potassium: 4.1 mEq/L (ref 3.5–5.1)

## 2012-09-11 MED ORDER — HEPARIN SOD (PORK) LOCK FLUSH 100 UNIT/ML IV SOLN
500.0000 [IU] | INTRAVENOUS | Status: DC | PRN
  Filled 2012-09-11: qty 5

## 2012-09-11 MED ORDER — HYDROCODONE-ACETAMINOPHEN 5-325 MG PO TABS: 1.0000 | ORAL_TABLET | ORAL | Status: DC | PRN

## 2012-09-11 MED ORDER — HEPARIN SOD (PORK) LOCK FLUSH 100 UNIT/ML IV SOLN
500.0000 [IU] | INTRAVENOUS | Status: DC
  Administered 2012-09-11: 500 [IU]

## 2012-09-11 NOTE — Discharge Summary (Signed)
Physician Discharge Summary  [name] MRN: [id number] DOB/AGE: 1950-10-11 62 y.o.  Admit date: 09/07/2012 Discharge date: 09/11/2012  Admission Diagnoses: Small bowel obstruction  Discharge Diagnoses: The same Active Problems:   * No active hospital problems. *   Discharged Condition: stable  Hospital Course: Patient presented to [facility] with increasing nausea and abdominal pain. Consistent with his previous history of small bowel obstruction. He was admitted for conservative management. Patient understands he is a high-risk and less than ideal candidate to return to the operating room due to his multiple previous operations and his known frozen abdomen. He did tolerate conservative management. He had increasing flatus. His nausea has improved. He has been advanced back on a diet which she is tolerating. His pain is improved. Discussion has been entertained regarding discharge. Patient is comfortable with the idea discharge at this time.  Consults: None  Significant Diagnostic Studies: labs: Daily  Treatments: IV hydration and nasogastric decompression  Discharge Exam: Blood pressure 157/75, pulse 62, temperature 98.5 F (36.9 C), temperature source Oral, resp. rate 18, height 5\' 9"  (1.753 m), weight 65.2 kg (143 lb 11.8 oz), SpO2 98.00%. General appearance: alert and no distress Resp: clear to auscultation bilaterally Cardio: regular rate and rhythm GI: soft, non-tender; bowel sounds normal; no masses,  no organomegaly  Disposition: 01-Home or Self Care  Discharge Orders   Future Appointments Provider Department Dept Phone   10/03/2012 10:30 AM [facility] [phone]   11/14/2012 10:30 AM [facility] [phone]   11/24/2012 11:30 AM [name] An, MD [facility] [phone]   12/26/2012 10:30 AM [facility] 7 [facility] [phone]   02/06/2013 10:30 AM  [facility] 7 [facility] [phone]   Future Orders Complete By Expires     Diet - low sodium heart healthy  As directed     Discharge instructions  As directed     Comments:      Increase activity as tolerated. May place ice pack for comfort.    Increase activity slowly  As directed         Medication List    TAKE these medications       alendronate 70 MG tablet  Commonly known as:  FOSAMAX  Take 70 mg by mouth every 7 (seven) days. On sundays     atorvastatin 20 MG tablet  Commonly known as:  LIPITOR  Take 20 mg by mouth at bedtime.     cholecalciferol 1000 UNITS tablet  Commonly known as:  VITAMIN D  Take 1,000 Units by mouth every morning.     cyanocobalamin 1000 MCG/ML injection  Commonly known as:  (VITAMIN B-12)  Inject 1,000 mcg into the muscle every 30 (thirty) days.     cyclobenzaprine 10 MG tablet  Commonly known as:  FLEXERIL  Take 10 mg by mouth daily as needed for muscle spasms.     dexlansoprazole 60 MG capsule  Commonly known as:  DEXILANT  Take 60 mg by mouth every morning.     docusate sodium 100 MG capsule  Commonly known as:  COLACE  Take 100 mg by mouth 2 (two) times daily.     enoxaparin 60 MG/0.6ML injection  Commonly known as:  LOVENOX  Inject 60 mg into the skin every evening.     ferrous sulfate 325 (65 FE) MG tablet  Take 325 mg by mouth 2 (two) times  daily.     folic acid 1 MG tablet  Commonly known as:  FOLVITE  Take 1 mg by mouth every morning.     HYDROcodone-acetaminophen 5-325 MG per tablet  Commonly known as:  NORCO  Take 1-2 tablets by mouth every 4 (four) hours as needed for pain.     Juice Plus Fibre Liqd  Take 1 Can by mouth daily.     LORazepam 1 MG tablet  Commonly known as:  ATIVAN  Take 1 mg by mouth every 3 (three) hours as needed. anxiety     ondansetron 4 MG tablet  Commonly known as:  ZOFRAN  Take 4 mg by mouth daily as needed for nausea.     polyethylene glycol powder powder   Commonly known as:  GLYCOLAX/MIRALAX  Take 17 g by mouth as needed. For constipation     prochlorperazine 10 MG tablet  Commonly known as:  COMPAZINE  Take 10 mg by mouth every 6 (six) hours as needed. Nausea and Vomiting     psyllium 0.52 G capsule  Commonly known as:  REGULOID  Take 2.6 g by mouth 2 (two) times daily. *5 capsules taken twice daily*     sertraline 100 MG tablet  Commonly known as:  ZOLOFT  Take 100 mg by mouth at bedtime.     sucralfate 1 GM/10ML suspension  Commonly known as:  CARAFATE  Take 1 g by mouth 4 (four) times daily as needed.     thiamine 100 MG tablet  Take 100 mg by mouth every morning.     traMADol 50 MG tablet  Commonly known as:  ULTRAM  Take 1-2 tablets (50-100 mg total) by mouth every 4 (four) hours as needed. For pain     valsartan-hydrochlorothiazide 80-12.5 MG per tablet  Commonly known as:  DIOVAN-HCT  Take 1 tablet by mouth every morning.     Vitamin D (Ergocalciferol) 50000 UNITS Caps  Commonly known as:  DRISDOL  Take 50,000 Units by mouth once a week. Takes on Sunday         Signed: Lindzey [name] C 09/11/2012, 11:10 AM

## 2012-09-15 ENCOUNTER — Inpatient Hospital Stay (HOSPITAL_COMMUNITY)
Admission: EM | Admit: 2012-09-15 | Discharge: 2012-09-22 | DRG: 180 | Disposition: A | Discharge status: Home / Self Care | Payer: Federal, State, Local not specified - PPO | Attending: General Surgery | Admitting: General Surgery

## 2012-09-15 ENCOUNTER — Encounter (HOSPITAL_COMMUNITY): Payer: Self-pay | Admitting: *Deleted

## 2012-09-15 ENCOUNTER — Emergency Department (HOSPITAL_COMMUNITY): Payer: Federal, State, Local not specified - PPO

## 2012-09-15 DIAGNOSIS — Z79899 Other long term (current) drug therapy: Secondary | ICD-10-CM

## 2012-09-15 DIAGNOSIS — R109 Unspecified abdominal pain: Secondary | ICD-10-CM

## 2012-09-15 DIAGNOSIS — K2100 Gastro-esophageal reflux disease with esophagitis, without bleeding: Secondary | ICD-10-CM | POA: Diagnosis present

## 2012-09-15 DIAGNOSIS — E860 Dehydration: Secondary | ICD-10-CM | POA: Diagnosis present

## 2012-09-15 DIAGNOSIS — Z8673 Personal history of transient ischemic attack (TIA), and cerebral infarction without residual deficits: Secondary | ICD-10-CM

## 2012-09-15 DIAGNOSIS — G4730 Sleep apnea, unspecified: Secondary | ICD-10-CM | POA: Diagnosis present

## 2012-09-15 DIAGNOSIS — Z86711 Personal history of pulmonary embolism: Secondary | ICD-10-CM

## 2012-09-15 DIAGNOSIS — E538 Deficiency of other specified B group vitamins: Secondary | ICD-10-CM | POA: Diagnosis present

## 2012-09-15 DIAGNOSIS — Z85038 Personal history of other malignant neoplasm of large intestine: Secondary | ICD-10-CM

## 2012-09-15 DIAGNOSIS — D649 Anemia, unspecified: Secondary | ICD-10-CM | POA: Diagnosis present

## 2012-09-15 DIAGNOSIS — K56609 Unspecified intestinal obstruction, unspecified as to partial versus complete obstruction: Principal | ICD-10-CM | POA: Diagnosis present

## 2012-09-15 DIAGNOSIS — M810 Age-related osteoporosis without current pathological fracture: Secondary | ICD-10-CM | POA: Diagnosis present

## 2012-09-15 DIAGNOSIS — Z903 Acquired absence of stomach [part of]: Secondary | ICD-10-CM

## 2012-09-15 DIAGNOSIS — I10 Essential (primary) hypertension: Secondary | ICD-10-CM | POA: Diagnosis present

## 2012-09-15 DIAGNOSIS — F17200 Nicotine dependence, unspecified, uncomplicated: Secondary | ICD-10-CM | POA: Diagnosis present

## 2012-09-15 DIAGNOSIS — G8929 Other chronic pain: Secondary | ICD-10-CM | POA: Diagnosis present

## 2012-09-15 DIAGNOSIS — M129 Arthropathy, unspecified: Secondary | ICD-10-CM | POA: Diagnosis present

## 2012-09-15 DIAGNOSIS — F411 Generalized anxiety disorder: Secondary | ICD-10-CM | POA: Diagnosis present

## 2012-09-15 LAB — CBC WITH DIFFERENTIAL/PLATELET
Basophils Relative: 1 % (ref 0–1)
Eosinophils Absolute: 0 10*3/uL (ref 0.0–0.7)
Hemoglobin: 11.8 g/dL — ABNORMAL LOW (ref 13.0–17.0)
Lymphs Abs: 2.2 10*3/uL (ref 0.7–4.0)
MCH: 29.6 pg (ref 26.0–34.0)
Monocytes Relative: 12 % (ref 3–12)
Neutro Abs: 4.1 10*3/uL (ref 1.7–7.7)
Neutrophils Relative %: 57 % (ref 43–77)
Platelets: 203 10*3/uL (ref 150–400)
RBC: 3.99 MIL/uL — ABNORMAL LOW (ref 4.22–5.81)

## 2012-09-15 LAB — COMPREHENSIVE METABOLIC PANEL
BUN: 7 mg/dL (ref 6–23)
CO2: 25 mEq/L (ref 19–32)
Calcium: 8.4 mg/dL (ref 8.4–10.5)
Chloride: 102 mEq/L (ref 96–112)
Creatinine, Ser: 0.91 mg/dL (ref 0.50–1.35)
GFR calc Af Amer: >90 mL/min (ref >90)
GFR calc non Af Amer: 90 mL/min — ABNORMAL LOW (ref >90)
Glucose, Bld: 92 mg/dL (ref 70–99)
Total Bilirubin: 0.4 mg/dL (ref 0.3–1.2)

## 2012-09-15 LAB — LIPASE, BLOOD: Lipase: 27 U/L (ref 11–59)

## 2012-09-15 MED ORDER — SODIUM CHLORIDE 0.9 % IV BOLUS (SEPSIS)
1000.0000 mL | Freq: Once | INTRAVENOUS | Status: AC
  Administered 2012-09-15: 1000 mL via INTRAVENOUS

## 2012-09-15 MED ORDER — ONDANSETRON HCL 4 MG/2ML IJ SOLN
4.0000 mg | Freq: Once | INTRAMUSCULAR | Status: AC
  Administered 2012-09-15: 4 mg via INTRAVENOUS
  Filled 2012-09-15: qty 2

## 2012-09-15 MED ORDER — HYDROMORPHONE HCL PF 1 MG/ML IJ SOLN
1.0000 mg | Freq: Once | INTRAMUSCULAR | Status: AC
  Administered 2012-09-15: 1 mg via INTRAVENOUS
  Filled 2012-09-15: qty 1

## 2012-09-15 NOTE — ED Notes (Signed)
Pt recently d/c'd on Monday for SBO, LBM was yesterday and loose per pt., pt states started vomiting today after breakfast and lunch, + nausea at this time, took Zofran at 1230 today per pt.

## 2012-09-15 NOTE — ED Notes (Signed)
LUQ pain, onset today, Recent adm with SBO. N/V, no diarrhea

## 2012-09-15 NOTE — ED Provider Notes (Signed)
History  This chart was scribed for Donnetta Hutching, MD by Ardelia Mems, ED Scribe. This patient was seen in room [facility] and the patient's care was started at 6:53 PM.  CSN: [id number]  Arrival date & time 09/15/12  1750    Chief Complaint  Patient presents with  . Abdominal Pain    The history is provided by the patient. No language interpreter was used.   HPI Comments: [name] is a 62 y.o. male with a hx of chronic abdominal pain, ileus, recurrent bowel obstruction, GERD, erosive esophagitis, PE, TIA and HTN who presents to the Emergency Department complaining of constant, moderate LUQ abdominal pain, with associated nausea and vomiting onset earlier today.  Nothing makes pain better or worse.  Severity is moderate   Pt has an extensive hx of GI surgeries.  PCP- [name]    Past Medical History  Diagnosis Date  . Pulmonary embolism 02/2010  . Hypertension   . Osteoporosis   . Arthritis   . TIA (transient ischemic attack) 10/11  . ETOH abuse     quit 03/2010  . 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 Personal history of PE (pulmonary embolism) 10/01/2010  . Blood transfusion   . S/P endoscopy September 28, 2010    erosive reflux esophagitis, Billroth I anatomy  . Shortness of breath   . Sleep apnea   . Recurrent upper respiratory infection (URI)   . Anxiety   . Pneumonia   . Anemia   . Ileus   . Chronic abdominal pain   . GERD (gastroesophageal reflux disease)   . Chest pain at rest   . Erosive esophagitis   . Vitamin B12 deficiency   . Bowel obstruction 05/13/2012    Recurrent  . Hx of Clostridium difficile infection 01/2012  . 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 Hernia repair      right inguinal  . Appendectomy  1980s  . Cholecystectomy  1980s  . Portacath placement    . Abdominal sugery      for bowel obstruction x 8, all in 1980s, except for one in 07/2010  .  Esophagogastroduodenoscopy  09/28/2010  . Esophagogastroduodenoscopy  12/01/2010    Cervical web status post dilation, erosive esophagitis, B1 hemigastrectomy, inflamed anastomosis  . Colonoscopy  03/18/2011    anastomosis at 35cm. Several adenomatous polyps removed. Sigmoid diverticulosis. Next TCS 02/2013  . Esophagogastroduodenoscopy  04/16/2011    excoriation at [facility]J c/w trauma/M-W tear, friable gastric anastomosis, dilation efferent limb  . Billroth 1 hemigastrectomy  1980s    per patient for benign duodenal tumor  . Esophagogastroduodenoscopy (egd) with esophageal dilation  02/25/2012    [id number] esophageal web-s/p dilation anddisruption as described above. Status post prior gastric with Billroth I configuration. Abnormal gastric mucosa at the anastomosis. Gastric biopsy showed mild chronic inflammation but no H. pylori   . Colonoscopy N/A 07/24/2012    Procedure: COLONOSCOPY;  Surgeon: Corbin Ade, MD;  Location: [facility];  Service: Endoscopy;  Laterality: N/A;  3:00  . Colon surgery  May 2012    left hemicolectomy, colon cancer found at time of surgery for bowel obstruction  . Cardiac catheterization  07/17/2012   Family History  Problem Relation Age of Onset  . Hypertension Mother   . Arthritis Mother   . Pneumonia Mother   . Hypertension Father   . Heart attack Father  History  Substance Use Topics  . Smoking status: Current Every Day Smoker -- 0.50 packs/day for 40 years    Types: Cigarettes  . Smokeless tobacco: Never Used  . Alcohol Use: No     Comment: last drink was Apr 16 2010    Review of Systems  All other systems reviewed and are negative.   A complete 10 system review of systems was obtained and all systems are negative except as noted in the HPI and PMH.   Allergies  Review of patient's allergies indicates no known allergies.  Home Medications   Current Outpatient Rx  Name  Route  Sig  Dispense  Refill  . alendronate (FOSAMAX) 70 MG tablet    Oral   Take 70 mg by mouth every 7 (seven) days. On sundays         . atorvastatin (LIPITOR) 20 MG tablet   Oral   Take 20 mg by mouth at bedtime.          . cholecalciferol (VITAMIN D) 1000 UNITS tablet   Oral   Take 1,000 Units by mouth every morning.         . cyanocobalamin (,VITAMIN B-12,) 1000 MCG/ML injection   Intramuscular   Inject 1,000 mcg into the muscle every 30 (thirty) days.         . cyclobenzaprine (FLEXERIL) 10 MG tablet   Oral   Take 10 mg by mouth daily as needed for muscle spasms.          [name] dexlansoprazole (DEXILANT) 60 MG capsule   Oral   Take 60 mg by mouth every morning.         . docusate sodium (COLACE) 100 MG capsule   Oral   Take 100 mg by mouth 2 (two) times daily.         [name] enoxaparin (LOVENOX) 60 MG/0.6ML injection   Subcutaneous   Inject 60 mg into the skin every evening.          . ferrous sulfate 325 (65 FE) MG tablet   Oral   Take 325 mg by mouth 2 (two) times daily.         . folic acid (FOLVITE) 1 MG tablet   Oral   Take 1 mg by mouth every morning.          [name] HYDROcodone-acetaminophen (NORCO) 5-325 MG per tablet   Oral   Take 1-2 tablets by mouth every 4 (four) hours as needed for pain.   35 tablet   0   . LORazepam (ATIVAN) 1 MG tablet   Oral   Take 1 mg by mouth every 3 (three) hours as needed. anxiety         . Nutritional Supplements (JUICE PLUS FIBRE) LIQD   Oral   Take 1 Can by mouth daily.         . ondansetron (ZOFRAN) 4 MG tablet   Oral   Take 4 mg by mouth daily as needed for nausea.         . polyethylene glycol powder (GLYCOLAX/MIRALAX) powder   Oral   Take 17 g by mouth as needed. For constipation         . prochlorperazine (COMPAZINE) 10 MG tablet   Oral   Take 10 mg by mouth every 6 (six) hours as needed. Nausea and Vomiting         . psyllium (REGULOID) 0.52 G capsule   Oral   Take 2.6 g by mouth 2 (  two) times daily. *5 capsules taken twice daily*         .  sertraline (ZOLOFT) 100 MG tablet   Oral   Take 100 mg by mouth at bedtime.         . sucralfate (CARAFATE) 1 GM/10ML suspension   Oral   Take 1 g by mouth 4 (four) times daily as needed.         . thiamine 100 MG tablet   Oral   Take 100 mg by mouth every morning.          . traMADol (ULTRAM) 50 MG tablet   Oral   Take 1-2 tablets (50-100 mg total) by mouth every 4 (four) hours as needed. For pain   30 tablet   1   . valsartan-hydrochlorothiazide (DIOVAN-HCT) 80-12.5 MG per tablet   Oral   Take 1 tablet by mouth every morning.          . Vitamin D, Ergocalciferol, (DRISDOL) 50000 UNITS CAPS   Oral   Take 50,000 Units by mouth once a week. Takes on Sunday          Triage Vitals: BP 157/98  Pulse 85  Temp(Src) 99.7 F (37.6 C) (Oral)  Resp 18  Ht 5\' 9"  (1.753 m)  Wt 140 lb (63.504 kg)  BMI 20.67 kg/m2  SpO2 100%  Physical Exam  Nursing note and vitals reviewed. Constitutional: He is oriented to person, place, and time. He appears well-developed and well-nourished.  HENT:  Head: Normocephalic and atraumatic.  Eyes: Conjunctivae and EOM are normal. Pupils are equal, round, and reactive to light.  Neck: Normal range of motion. Neck supple.  Cardiovascular: Normal rate, regular rhythm and normal heart sounds.   Pulmonary/Chest: Effort normal and breath sounds normal.  Abdominal:  Minimal generalized tenderness  Musculoskeletal: Normal range of motion.  Neurological: He is alert and oriented to person, place, and time.  Skin: Skin is warm and dry.  Psychiatric: He has a normal mood and affect.    ED Course  Procedures (including critical care time)  DIAGNOSTIC STUDIES: Oxygen Saturation is 100% on RA, normal by my interpretation.    COORDINATION OF CARE: 7:00 PM- Pt advised of plan for treatment and pt agrees.   Medications  sodium chloride 0.9 % bolus 1,000 mL (1,000 mLs Intravenous New Bag/Given 09/15/12 2003)  HYDROmorphone (DILAUDID) injection  1 mg (1 mg Intravenous Given 09/15/12 2004)  ondansetron (ZOFRAN) injection 4 mg (4 mg Intravenous Given 09/15/12 2004)    Labs Reviewed  COMPREHENSIVE METABOLIC PANEL - Abnormal; Notable for the following:    Albumin 3.4 (*)    GFR calc non Af Amer 90 (*)    All other components within normal limits  CBC WITH DIFFERENTIAL - Abnormal; Notable for the following:    RBC 3.99 (*)    Hemoglobin 11.8 (*)    HCT 35.4 (*)    All other components within normal limits  LIPASE, BLOOD    Dg Abd Acute W/chest  09/15/2012   *RADIOLOGY REPORT*  Clinical Data: Left flank pain for several days, evaluate for small bowel obstruction  ACUTE ABDOMEN SERIES (ABDOMEN 2 VIEW & CHEST 1 VIEW)  Comparison: 09/07/2012; CT abdomen pelvis/06/12/2012  Findings:  Grossly unchanged cardiac silhouette and mediastinal contours with mild tortuosity of the thoracic aorta.  Stable positioning of support apparatus.  No focal airspace opacities.  No pleural effusion or pneumothorax.  No definite evidence of edema.  There is grossly unchanged severe gaseous distension  of the colon. This finding is without definitive dilatation of the upstream small bowel.  No pneumoperitoneum, pneumatosis or portal venous gas.  Multiple metallic skin staples and sutures are seen clustered within the midline of the upper abdomen as well as the left mid and lower abdominal quadrant.  No acute osseous abnormality.  IMPRESSION: 1.  Grossly unchanged marked gaseous distension of the colon presumably secondary to ileus.  No gas distension of the upstream small bowel to suggest small bowel obstruction. 2.  No acute cardiopulmonary disease.   Original Report Authenticated By: Tacey Ruiz, MD    No diagnosis found.  MDM  No acute abdomen, however patient continues to have pain. He is dehydrated. Patient has history of multiple abdominal surgeries, adhesions, obstruction. Discussed with Dr. Leticia Penna. Admit         I personally performed the services  described in this documentation, which was scribed in my presence. The recorded information has been reviewed and is accurate.    Donnetta Hutching, MD 09/15/12 [phone]

## 2012-09-16 LAB — CBC
HCT: 34.6 % — ABNORMAL LOW (ref 39.0–52.0)
Hemoglobin: 11.5 g/dL — ABNORMAL LOW (ref 13.0–17.0)
RBC: 3.84 MIL/uL — ABNORMAL LOW (ref 4.22–5.81)
WBC: 6.5 10*3/uL (ref 4.0–10.5)

## 2012-09-16 LAB — BASIC METABOLIC PANEL
Chloride: 103 mEq/L (ref 96–112)
GFR calc Af Amer: >90 mL/min (ref >90)
Potassium: 4.2 mEq/L (ref 3.5–5.1)

## 2012-09-16 MED ORDER — ONDANSETRON HCL 4 MG/2ML IJ SOLN
4.0000 mg | Freq: Four times a day (QID) | INTRAMUSCULAR | Status: DC | PRN
  Administered 2012-09-16 – 2012-09-22 (×20): 4 mg via INTRAVENOUS
  Filled 2012-09-16 (×19): qty 2

## 2012-09-16 MED ORDER — PANTOPRAZOLE SODIUM 40 MG IV SOLR
40.0000 mg | Freq: Every day | INTRAVENOUS | Status: DC
  Administered 2012-09-16 – 2012-09-21 (×7): 40 mg via INTRAVENOUS
  Filled 2012-09-16 (×7): qty 40

## 2012-09-16 MED ORDER — LACTATED RINGERS IV SOLN
INTRAVENOUS | Status: DC
  Administered 2012-09-16 – 2012-09-22 (×15): via INTRAVENOUS

## 2012-09-16 MED ORDER — ENOXAPARIN SODIUM 40 MG/0.4ML ~~LOC~~ SOLN
40.0000 mg | SUBCUTANEOUS | Status: DC
  Administered 2012-09-16 – 2012-09-22 (×7): 40 mg via SUBCUTANEOUS
  Filled 2012-09-16 (×6): qty 0.4

## 2012-09-16 MED ORDER — HYDROMORPHONE HCL PF 1 MG/ML IJ SOLN
1.0000 mg | INTRAMUSCULAR | Status: DC | PRN
  Administered 2012-09-16 (×6): 1 mg via INTRAVENOUS
  Administered 2012-09-17 (×2): 2 mg via INTRAVENOUS
  Administered 2012-09-17: 1 mg via INTRAVENOUS
  Administered 2012-09-17: 2 mg via INTRAVENOUS
  Administered 2012-09-17: 1 mg via INTRAVENOUS
  Administered 2012-09-17: 2 mg via INTRAVENOUS
  Administered 2012-09-18 (×3): 1 mg via INTRAVENOUS
  Administered 2012-09-18 (×2): 2 mg via INTRAVENOUS
  Administered 2012-09-19 (×4): 1 mg via INTRAVENOUS
  Administered 2012-09-19: 2 mg via INTRAVENOUS
  Administered 2012-09-19: 1 mg via INTRAVENOUS
  Administered 2012-09-20: 2 mg via INTRAVENOUS
  Administered 2012-09-20: 1 mg via INTRAVENOUS
  Administered 2012-09-20 – 2012-09-21 (×7): 2 mg via INTRAVENOUS
  Administered 2012-09-21: 1 mg via INTRAVENOUS
  Administered 2012-09-21 – 2012-09-22 (×3): 2 mg via INTRAVENOUS
  Filled 2012-09-16: qty 2
  Filled 2012-09-16: qty 1
  Filled 2012-09-16 (×3): qty 2
  Filled 2012-09-16: qty 1
  Filled 2012-09-16: qty 2
  Filled 2012-09-16: qty 1
  Filled 2012-09-16 (×2): qty 2
  Filled 2012-09-16: qty 1
  Filled 2012-09-16 (×2): qty 2
  Filled 2012-09-16: qty 1
  Filled 2012-09-16: qty 2
  Filled 2012-09-16 (×3): qty 1
  Filled 2012-09-16 (×2): qty 2
  Filled 2012-09-16: qty 1
  Filled 2012-09-16: qty 2
  Filled 2012-09-16: qty 1
  Filled 2012-09-16 (×4): qty 2
  Filled 2012-09-16: qty 1
  Filled 2012-09-16: qty 2
  Filled 2012-09-16 (×3): qty 1
  Filled 2012-09-16: qty 2
  Filled 2012-09-16: qty 1
  Filled 2012-09-16: qty 2
  Filled 2012-09-16: qty 1

## 2012-09-16 NOTE — H&P (Signed)
[name] is an 62 y.o. male.   Chief Complaint: Abdominal pain. HPI: Patient returns to ED with abdominal pain.  States pain was better at dischage.  Actually had BM at discharge.  Still with flatus but no BM since.  No nausea currently.  No fever or chills  Past Medical History  Diagnosis Date  . Pulmonary embolism 02/2010  . Hypertension   . Osteoporosis   . Arthritis   . TIA (transient ischemic attack) 10/11  . ETOH abuse     quit 03/2010  . 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 Personal history of PE (pulmonary embolism) 10/01/2010  . Blood transfusion   . S/P endoscopy September 28, 2010    erosive reflux esophagitis, Billroth I anatomy  . Shortness of breath   . Sleep apnea   . Recurrent upper respiratory infection (URI)   . Anxiety   . Pneumonia   . Anemia   . Ileus   . Chronic abdominal pain   . GERD (gastroesophageal reflux disease)   . Chest pain at rest   . Erosive esophagitis   . Vitamin B12 deficiency   . Bowel obstruction 05/13/2012    Recurrent  . Hx of Clostridium difficile infection 01/2012  . 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 Hernia repair      right inguinal  . Appendectomy  1980s  . Cholecystectomy  1980s  . Portacath placement    . Abdominal sugery      for bowel obstruction x 8, all in 1980s, except for one in 07/2010  . Esophagogastroduodenoscopy  09/28/2010  . Esophagogastroduodenoscopy  12/01/2010    Cervical web status post dilation, erosive esophagitis, B1 hemigastrectomy, inflamed anastomosis  . Colonoscopy  03/18/2011    anastomosis at 35cm. Several adenomatous polyps removed. Sigmoid diverticulosis. Next TCS 02/2013  . Esophagogastroduodenoscopy  04/16/2011    excoriation at [facility] c/w trauma/M-W tear, friable gastric anastomosis, dilation efferent limb  . Billroth 1 hemigastrectomy  1980s    per patient for benign duodenal tumor  . Esophagogastroduodenoscopy  (egd) with esophageal dilation  02/25/2012    [id number] esophageal web-s/p dilation anddisruption as described above. Status post prior gastric with Billroth I configuration. Abnormal gastric mucosa at the anastomosis. Gastric biopsy showed mild chronic inflammation but no H. pylori   . Colonoscopy N/A 07/24/2012    Procedure: COLONOSCOPY;  Surgeon: Corbin Ade, MD;  Location: [facility];  Service: Endoscopy;  Laterality: N/A;  3:00  . Colon surgery  May 2012    left hemicolectomy, colon cancer found at time of surgery for bowel obstruction  . Cardiac catheterization  07/17/2012    Family History  Problem Relation Age of Onset  . Hypertension Mother   . Arthritis Mother   . Pneumonia Mother   . Hypertension Father   . Heart attack Father    Social History:  reports that he has been smoking Cigarettes.  He has a 20 pack-year smoking history. He has never used smokeless tobacco. He reports that he does not drink alcohol or use illicit drugs.  Allergies: No Known Allergies  Medications Prior to Admission  Medication Sig Dispense Refill  . alendronate (FOSAMAX) 70 MG tablet Take 70 mg by mouth every 7 (seven) days. On sundays      . atorvastatin (LIPITOR) 20 MG tablet Take 20 mg by mouth at bedtime.       [name]  cholecalciferol (VITAMIN D) 1000 UNITS tablet Take 1,000 Units by mouth every morning.      . cyanocobalamin (,VITAMIN B-12,) 1000 MCG/ML injection Inject 1,000 mcg into the muscle every 30 (thirty) days.      . cyclobenzaprine (FLEXERIL) 10 MG tablet Take 10 mg by mouth daily as needed for muscle spasms.       [name] dexlansoprazole (DEXILANT) 60 MG capsule Take 60 mg by mouth every morning.      . docusate sodium (COLACE) 100 MG capsule Take 100 mg by mouth 2 (two) times daily.      [name] enoxaparin (LOVENOX) 60 MG/0.6ML injection Inject 60 mg into the skin every evening.       . ferrous sulfate 325 (65 FE) MG tablet Take 325 mg by mouth 2 (two) times daily.      . folic acid (FOLVITE)  1 MG tablet Take 1 mg by mouth every morning.       [name] HYDROcodone-acetaminophen (NORCO) 5-325 MG per tablet Take 1-2 tablets by mouth every 4 (four) hours as needed for pain.  35 tablet  0  . LORazepam (ATIVAN) 1 MG tablet Take 1 mg by mouth every 3 (three) hours as needed. anxiety      . Nutritional Supplements (JUICE PLUS FIBRE) LIQD Take 1 Can by mouth daily.      . ondansetron (ZOFRAN) 4 MG tablet Take 4 mg by mouth daily as needed for nausea.      . polyethylene glycol powder (GLYCOLAX/MIRALAX) powder Take 17 g by mouth as needed. For constipation      . prochlorperazine (COMPAZINE) 10 MG tablet Take 10 mg by mouth every 6 (six) hours as needed. Nausea and Vomiting      . psyllium (REGULOID) 0.52 G capsule Take 2.6 g by mouth 2 (two) times daily. *5 capsules taken twice daily*      . sertraline (ZOLOFT) 100 MG tablet Take 100 mg by mouth at bedtime.      . sucralfate (CARAFATE) 1 GM/10ML suspension Take 1 g by mouth 4 (four) times daily as needed (for relief).       . thiamine 100 MG tablet Take 100 mg by mouth every morning.       . traMADol (ULTRAM) 50 MG tablet Take 1-2 tablets (50-100 mg total) by mouth every 4 (four) hours as needed. For pain  30 tablet  1  . valsartan-hydrochlorothiazide (DIOVAN-HCT) 80-12.5 MG per tablet Take 1 tablet by mouth every morning.       . Vitamin D, Ergocalciferol, (DRISDOL) 50000 UNITS CAPS Take 50,000 Units by mouth once a week. Takes on Sunday        Results for orders placed during the hospital encounter of 09/15/12 (from the past 48 hour(s))  COMPREHENSIVE METABOLIC PANEL     Status: Abnormal   Collection Time    09/15/12  8:40 PM      Result Value Range   Sodium 139  135 - 145 mEq/L   Potassium 4.1  3.5 - 5.1 mEq/L   Chloride 102  96 - 112 mEq/L   CO2 25  19 - 32 mEq/L   Glucose, Bld 92  70 - 99 mg/dL   BUN 7  6 - 23 mg/dL   Creatinine, Ser 1.61  0.50 - 1.35 mg/dL   Calcium 8.4  8.4 - 09.6 mg/dL   Total Protein 6.2  6.0 - 8.3 g/dL   Albumin  3.4 (*) 3.5 - 5.2 g/dL   AST 19  0 - 37 U/L   ALT 10  0 - 53 U/L   Alkaline Phosphatase 80  39 - 117 U/L   Total Bilirubin 0.4  0.3 - 1.2 mg/dL   GFR calc non Af Amer 90 (*) >90 mL/min   GFR calc Af Amer >90  >90 mL/min   Comment:            The eGFR has been calculated     using the CKD EPI equation.     This calculation has not been     validated in all clinical     situations.     eGFR's persistently     <90 mL/min signify     possible Chronic Kidney Disease.  CBC WITH DIFFERENTIAL     Status: Abnormal   Collection Time    09/15/12  8:40 PM      Result Value Range   WBC 7.2  4.0 - 10.5 K/uL   RBC 3.99 (*) 4.22 - 5.81 MIL/uL   Hemoglobin 11.8 (*) 13.0 - 17.0 g/dL   HCT 16.1 (*) 09.6 - 04.5 %   MCV 88.7  78.0 - 100.0 fL   MCH 29.6  26.0 - 34.0 pg   MCHC 33.3  30.0 - 36.0 g/dL   RDW 40.9  81.1 - 91.4 %   Platelets 203  150 - 400 K/uL   Neutrophils Relative % 57  43 - 77 %   Neutro Abs 4.1  1.7 - 7.7 K/uL   Lymphocytes Relative 30  12 - 46 %   Lymphs Abs 2.2  0.7 - 4.0 K/uL   Monocytes Relative 12  3 - 12 %   Monocytes Absolute 0.9  0.1 - 1.0 K/uL   Eosinophils Relative 0  0 - 5 %   Eosinophils Absolute 0.0  0.0 - 0.7 K/uL   Basophils Relative 1  0 - 1 %   Basophils Absolute 0.0  0.0 - 0.1 K/uL  LIPASE, BLOOD     Status: None   Collection Time    09/15/12  8:40 PM      Result Value Range   Lipase 27  11 - 59 U/L  BASIC METABOLIC PANEL     Status: Abnormal   Collection Time    09/16/12  4:58 AM      Result Value Range   Sodium 138  135 - 145 mEq/L   Potassium 4.2  3.5 - 5.1 mEq/L   Chloride 103  96 - 112 mEq/L   CO2 28  19 - 32 mEq/L   Glucose, Bld 114 (*) 70 - 99 mg/dL   BUN 6  6 - 23 mg/dL   Creatinine, Ser 7.82  0.50 - 1.35 mg/dL   Calcium 8.2 (*) 8.4 - 10.5 mg/dL   GFR calc non Af Amer 88 (*) >90 mL/min   GFR calc Af Amer >90  >90 mL/min   Comment:            The eGFR has been calculated     using the CKD EPI equation.     This calculation has not been      validated in all clinical     situations.     eGFR's persistently     <90 mL/min signify     possible Chronic Kidney Disease.  CBC     Status: Abnormal   Collection Time    09/16/12  4:58 AM      Result Value Range   WBC 6.5  4.0 - 10.5 K/uL   RBC 3.84 (*) 4.22 - 5.81 MIL/uL   Hemoglobin 11.5 (*) 13.0 - 17.0 g/dL   HCT 40.9 (*) 81.1 - 91.4 %   MCV 90.1  78.0 - 100.0 fL   MCH 29.9  26.0 - 34.0 pg   MCHC 33.2  30.0 - 36.0 g/dL   RDW 78.2  95.6 - 21.3 %   Platelets 201  150 - 400 K/uL   Dg Abd Acute W/chest  09/15/2012   *RADIOLOGY REPORT*  Clinical Data: Left flank pain for several days, evaluate for small bowel obstruction  ACUTE ABDOMEN SERIES (ABDOMEN 2 VIEW & CHEST 1 VIEW)  Comparison: 09/07/2012; CT abdomen pelvis/06/12/2012  Findings:  Grossly unchanged cardiac silhouette and mediastinal contours with mild tortuosity of the thoracic aorta.  Stable positioning of support apparatus.  No focal airspace opacities.  No pleural effusion or pneumothorax.  No definite evidence of edema.  There is grossly unchanged severe gaseous distension of the colon. This finding is without definitive dilatation of the upstream small bowel.  No pneumoperitoneum, pneumatosis or portal venous gas.  Multiple metallic skin staples and sutures are seen clustered within the midline of the upper abdomen as well as the left mid and lower abdominal quadrant.  No acute osseous abnormality.  IMPRESSION: 1.  Grossly unchanged marked gaseous distension of the colon presumably secondary to ileus.  No gas distension of the upstream small bowel to suggest small bowel obstruction. 2.  No acute cardiopulmonary disease.   Original Report Authenticated By: Tacey Ruiz, MD    Review of Systems  Constitutional: Negative for fever and chills.  HENT: Negative.   Eyes: Negative.   Respiratory: Negative.   Cardiovascular: Negative.   Gastrointestinal: Positive for nausea, abdominal pain (LLQ) and constipation. Negative for  vomiting, diarrhea, blood in stool and melena.  Genitourinary: Negative.   Musculoskeletal: Negative.   Skin: Negative.   Neurological: Negative.   Endo/Heme/Allergies: Negative.   Psychiatric/Behavioral: Negative.     Blood pressure 153/82, pulse 56, temperature 98.6 F (37 C), temperature source Oral, resp. rate 18, height 5\' 9"  (1.753 m), weight 62.37 kg (137 lb 8 oz), SpO2 98.00%. Physical Exam  Constitutional: He is oriented to person, place, and time. He appears well-developed and well-nourished. No distress.  HENT:  Head: Normocephalic and atraumatic.  Eyes: Conjunctivae and EOM are normal. Pupils are equal, round, and reactive to light. No scleral icterus.  Neck: Normal range of motion. Neck supple. No tracheal deviation present. No thyromegaly present.  Cardiovascular: Normal rate, regular rhythm and normal heart sounds.   Respiratory: Effort normal and breath sounds normal.  GI: Soft. He exhibits no distension and no mass. There is tenderness (mild LLQ tenderness). There is no rebound and no guarding.  Musculoskeletal: Normal range of motion.  Lymphadenopathy:    He has no cervical adenopathy.  Neurological: He is alert and oriented to person, place, and time.  Skin: Skin is warm and dry.     Assessment/Plan SBO, partial.  Continue conservative measures.  Patient poor candidate for OR but given recent course likely will need to proceed in that direction.  If symptoms worsen will plan to proceed on Monday.  If symptoms do not progress or improve, SBFT on Monday.  If improves will advance but inlikely.  Continue IV fluid.  Bowel rest.  Sten Dematteo C 09/16/2012, 10:04 PM

## 2012-09-17 NOTE — Progress Notes (Signed)
  Subjective: Patient states he feels about the same. Still specimen abdominal tenderness. No increase in symptomatology. Still passing flatus. No bowel movement. No significant nausea.  Objective: Vital signs in last 24 hours: Temp:  [98 F (36.7 C)-98.6 F (37 C)] 98.2 F (36.8 C) (06/29 0422) Pulse Rate:  [56-69] 69 (06/29 0422) Resp:  [18-19] 18 (06/29 0422) BP: (142-159)/(72-86) 142/72 mmHg (06/29 0422) SpO2:  [98 %-100 %] 100 % (06/29 0422) Last BM Date: 09/14/12  Intake/Output from previous day: 06/28 0701 - 06/29 0700 In: 2765 [I.V.:2765] Out: 650 [Urine:650] Intake/Output this shift: Total I/O In: -  Out: 400 [Urine:400]  General appearance: alert and no distress GI: Abdomen is soft flat mild to moderate diffuse tenderness. No distention. No peritoneal signs. Intermittent bowel sounds  Lab Results:   Recent Labs  09/15/12 2040 09/16/12 0458  WBC 7.2 6.5  HGB 11.8* 11.5*  HCT 35.4* 34.6*  PLT 203 201   BMET  Recent Labs  09/15/12 2040 09/16/12 0458  NA 139 138  K 4.1 4.2  CL 102 103  CO2 25 28  GLUCOSE 92 114*  BUN 7 6  CREATININE 0.91 0.94  CALCIUM 8.4 8.2*   PT/INR No results found for this basename: LABPROT, INR,  in the last 72 hours ABG No results found for this basename: PHART, PCO2, PO2, HCO3,  in the last 72 hours  Studies/Results: Dg Abd Acute W/chest  09/15/2012   *RADIOLOGY REPORT*  Clinical Data: Left flank pain for several days, evaluate for small bowel obstruction  ACUTE ABDOMEN SERIES (ABDOMEN 2 VIEW & CHEST 1 VIEW)  Comparison: 09/07/2012; CT abdomen pelvis/06/12/2012  Findings:  Grossly unchanged cardiac silhouette and mediastinal contours with mild tortuosity of the thoracic aorta.  Stable positioning of support apparatus.  No focal airspace opacities.  No pleural effusion or pneumothorax.  No definite evidence of edema.  There is grossly unchanged severe gaseous distension of the colon. This finding is without definitive  dilatation of the upstream small bowel.  No pneumoperitoneum, pneumatosis or portal venous gas.  Multiple metallic skin staples and sutures are seen clustered within the midline of the upper abdomen as well as the left mid and lower abdominal quadrant.  No acute osseous abnormality.  IMPRESSION: 1.  Grossly unchanged marked gaseous distension of the colon presumably secondary to ileus.  No gas distension of the upstream small bowel to suggest small bowel obstruction. 2.  No acute cardiopulmonary disease.   Original Report Authenticated By: Tacey Ruiz, MD    Anti-infectives: Anti-infectives   None      Assessment/Plan: s/p * No surgery found * Persistent partial small bowel obstruction. At this time I had a long discussion with the patient regarding his findings. He is still not ideal surgical candidate however I do feel a small bowel follow-through will at least give [place] some additional information. This will be scheduled for tomorrow. Pending the results of that study we'll determine whether or not to proceed to the operating room with the known inherent risks. Continue on IV fluid hydration. Continue with bowel rest for now. Patient's comfortable with planned management.  LOS: 2 days    [name] C 09/17/2012

## 2012-09-18 ENCOUNTER — Inpatient Hospital Stay (HOSPITAL_COMMUNITY): Payer: Federal, State, Local not specified - PPO

## 2012-09-18 NOTE — Progress Notes (Signed)
  Subjective: No significant increase in pain with the administration of the oral contrast. No nausea. Still with flatus. No bowel movement today.  Objective: Vital signs in last 24 hours: Temp:  [98.5 F (36.9 C)-100.6 F (38.1 C)] 100.6 F (38.1 C) (06/30 0427) Pulse Rate:  [68-91] 91 (06/30 0427) Resp:  [18] 18 (06/30 0427) BP: (137-156)/(74-87) 145/74 mmHg (06/30 0427) SpO2:  [95 %-100 %] 96 % (06/30 0427) Last BM Date: 09/14/12  Intake/Output from previous day: 06/29 0701 - 06/30 0700 In: 2579.8 [I.V.:2579.8] Out: 1600 [Urine:1600] Intake/Output this shift:    General appearance: alert and no distress GI: Intermittent bowel sounds, soft, flat, mild diffuse tenderness. No peritoneal signs.  Lab Results:   Recent Labs  09/15/12 2040 09/16/12 0458  WBC 7.2 6.5  HGB 11.8* 11.5*  HCT 35.4* 34.6*  PLT 203 201   BMET  Recent Labs  09/15/12 2040 09/16/12 0458  NA 139 138  K 4.1 4.2  CL 102 103  CO2 25 28  GLUCOSE 92 114*  BUN 7 6  CREATININE 0.91 0.94  CALCIUM 8.4 8.2*   PT/INR No results found for this basename: LABPROT, INR,  in the last 72 hours ABG No results found for this basename: PHART, PCO2, PO2, HCO3,  in the last 72 hours  Studies/Results: No results found.  Anti-infectives: Anti-infectives   None      Assessment/Plan: s/p * No surgery found * Persistent small bowel obstruction. Currently undergoing small bowel follow-through. Contrast has been demonstrated moving although small bowel loops are somewhat dilated. Clinically he remained stable. Upon completion of the small bowel follow-through we will determine her next course of action. Again patient is a poor surgical candidate due to his multiple abdominal surgeries and high complication risks.  Continue IV fluid hydration for now.  LOS: 3 days    Lukas Pelcher C 09/18/2012

## 2012-09-18 NOTE — Care Management Note (Unsigned)
    Page 1 of 1   09/18/2012     1:06:35 PM   CARE MANAGEMENT NOTE 09/18/2012  Patient:  [name], [name]   Account Number:  [id number]  Date Initiated:  09/18/2012  Documentation initiated by:  Sharrie Rothman  Subjective/Objective Assessment:   Pt readmitted with SBO. Pt lives with his wife and will return home at discharge. Pt is independent with ADL's.     Action/Plan:   Will continue to follow. No CM needs noted at this time. Pt may require surgery this admission.   Anticipated DC Date:  09/21/2012   Anticipated DC Plan:  HOME/SELF CARE      DC Planning Services  CM consult      Choice offered to / List presented to:             Status of service:  In process, will continue to follow Medicare Important Message given?   (If response is "NO", the following Medicare IM given date fields will be blank) Date Medicare IM given:   Date Additional Medicare IM given:    Discharge Disposition:    Per UR Regulation:    If discussed at Long Length of Stay Meetings, dates discussed:    Comments:  09/18/12 1305 Arlyss Queen, RN BSN CM

## 2012-09-18 NOTE — Progress Notes (Signed)
UR chart review completed.  

## 2012-09-19 MED ORDER — ERYTHROMYCIN BASE 250 MG PO TABS
250.0000 mg | ORAL_TABLET | Freq: Two times a day (BID) | ORAL | Status: DC
  Administered 2012-09-19 – 2012-09-22 (×6): 250 mg via ORAL
  Filled 2012-09-19 (×6): qty 1

## 2012-09-19 NOTE — Progress Notes (Signed)
  Subjective: Seen earlier today.  Resting comfortably.  +Flatus.  No bm.    Objective: Vital signs in last 24 hours: Temp:  [98.8 F (37.1 C)-98.9 F (37.2 C)] 98.8 F (37.1 C) (07/01 1508) Pulse Rate:  [60-63] 60 (07/01 1508) Resp:  [18] 18 (07/01 1508) BP: (112-123)/(71-77) 123/77 mmHg (07/01 1508) SpO2:  [96 %-97 %] 96 % (07/01 1508) Last BM Date: 10/14/12  Intake/Output from previous day: 09-26-22 0701 - 07/01 0700 In: 2825.2 [I.V.:2825.2] Out: 1600 [Urine:1600] Intake/Output this shift:    General appearance: no distress and resting comfortably GI: soft, non-tender; bowel sounds normal; no masses,  no organomegaly  Lab Results:  No results found for this basename: WBC, HGB, HCT, PLT,  in the last 72 hours BMET No results found for this basename: NA, K, CL, CO2, GLUCOSE, BUN, CREATININE, CALCIUM,  in the last 72 hours PT/INR No results found for this basename: LABPROT, INR,  in the last 72 hours ABG No results found for this basename: PHART, PCO2, PO2, HCO3,  in the last 72 hours  Studies/Results: Dg Small Bowel  2012/09/25   *RADIOLOGY REPORT*  Clinical Data:  Partial small bowel obstruction, abdominal pain, nausea, and vomiting for 4 days, multiple prior abdominal surgeries  SMALL BOWEL SERIES  Technique:  Following ingestion of a mixture of thin barium and EnteroVu, serial small bowel images were obtained including spot views of the terminal ileum.  Fluoroscopy Time: 2 minutes 54 seconds  Comparison: Abdominal radiographs 09/15/2012  Findings: On scout image, surgical clips are seen in the left mid abdomen from prior bowel surgery. Gaseous distention of bowel loop in the upper mid abdomen is identified question transverse colon. Stool in right colon. Air filled loops of bowel in the mid abdomen, nonspecific. Osseous demineralization.  No gastric outlet obstruction. Sequential images of the abdomen demonstrate dilated small bowel loops proximally with passage of contrast  through small bowel to the right colon arriving at 4 hours. No focal point of small bowel obstruction is identified. Small bowel mucosa appears smooth without irregularity or ulceration. Multiple spot views of the small bowel demonstrate mucosa with smooth margins without discrete irregularity or ulceration. Retained stool in right colon. Appendix surgically absent by history. No persistent intraluminal filling defects or definite stricture identified.  IMPRESSION: Dilated proximal small bowel loops though sequential imaging of the small bowel and numerous spot views failed to identify a discrete transition zone, stricture, or point of obstruction.   Original Report Authenticated By: Ulyses Southward, M.D.    Anti-infectives: Anti-infectives   Start     Dose/Rate Route Frequency Ordered Stop   09/19/12 1700  erythromycin (E-MYCIN) tablet 250 mg     250 mg Oral 2 times daily with meals 09/19/12 1252        Assessment/Plan: s/p * No surgery found * Partial SBO.  SBFT reassuring for progression of GI function.  No acute surgical indications.  INcrease diet slowly.  LOS: 4 days    Amelie Caracci C 09/19/2012

## 2012-09-20 MED ORDER — METOCLOPRAMIDE HCL 10 MG PO TABS
5.0000 mg | ORAL_TABLET | Freq: Three times a day (TID) | ORAL | Status: DC
  Administered 2012-09-20 – 2012-09-22 (×7): 5 mg via ORAL
  Filled 2012-09-20 (×7): qty 1

## 2012-09-20 NOTE — Progress Notes (Signed)
  Subjective: Feels about the same. Positive flatus. No nausea. Tolerating clears. Abdominal tenderness about the same the  Objective: Vital signs in last 24 hours: Temp:  [98.5 F (36.9 C)-99.6 F (37.6 C)] 98.5 F (36.9 C) (07/02 0531) Pulse Rate:  [58-61] 58 (07/02 0531) Resp:  [18] 18 (07/02 0531) BP: (123-139)/(73-79) 123/73 mmHg (07/02 0531) SpO2:  [94 %-96 %] 95 % (07/02 0531) Last BM Date: 09/14/12  Intake/Output from previous day: 07/01 0701 - 07/02 0700 In: 1155 [P.O.:120; I.V.:1035] Out: 1450 [Urine:1450] Intake/Output this shift: Total I/O In: 1000 [P.O.:1000] Out: -   General appearance: alert and no distress GI: Positive bowel sounds, soft, mild diffuse tenderness. No point tenderness. No peritoneal signs.  Lab Results:  No results found for this basename: WBC, HGB, HCT, PLT,  in the last 72 hours BMET No results found for this basename: NA, K, CL, CO2, GLUCOSE, BUN, CREATININE, CALCIUM,  in the last 72 hours PT/INR No results found for this basename: LABPROT, INR,  in the last 72 hours ABG No results found for this basename: PHART, PCO2, PO2, HCO3,  in the last 72 hours  Studies/Results: Dg Small Bowel  2012/10/09   *RADIOLOGY REPORT*  Clinical Data:  Partial small bowel obstruction, abdominal pain, nausea, and vomiting for 4 days, multiple prior abdominal surgeries  SMALL BOWEL SERIES  Technique:  Following ingestion of a mixture of thin barium and EnteroVu, serial small bowel images were obtained including spot views of the terminal ileum.  Fluoroscopy Time: 2 minutes 54 seconds  Comparison: Abdominal radiographs 09/15/2012  Findings: On scout image, surgical clips are seen in the left mid abdomen from prior bowel surgery. Gaseous distention of bowel loop in the upper mid abdomen is identified question transverse colon. Stool in right colon. Air filled loops of bowel in the mid abdomen, nonspecific. Osseous demineralization.  No gastric outlet obstruction.  Sequential images of the abdomen demonstrate dilated small bowel loops proximally with passage of contrast through small bowel to the right colon arriving at 4 hours. No focal point of small bowel obstruction is identified. Small bowel mucosa appears smooth without irregularity or ulceration. Multiple spot views of the small bowel demonstrate mucosa with smooth margins without discrete irregularity or ulceration. Retained stool in right colon. Appendix surgically absent by history. No persistent intraluminal filling defects or definite stricture identified.  IMPRESSION: Dilated proximal small bowel loops though sequential imaging of the small bowel and numerous spot views failed to identify a discrete transition zone, stricture, or point of obstruction.   Original Report Authenticated By: Ulyses Southward, M.D.    Anti-infectives: Anti-infectives   Start     Dose/Rate Route Frequency Ordered Stop   09/19/12 1700  erythromycin (E-MYCIN) tablet 250 mg     250 mg Oral 2 times daily with meals 09/19/12 1252        Assessment/Plan: s/p * No surgery found * Partial small bowel obstruction. Again discussed with patient the findings of the small bowel follow-through. Given the fact that he had no evidence of any obstructive areas and relatively normal transition to the colon I do feel it is appropriate to continue to advance diet. Continue erythromycin to help with GI stimulation. We will add Reglan although I must suspicious of a gastric etiology for all the symptomatology. Continue to advance diet. Hopeful discharge tomorrow  LOS: 5 days    Ziomara Birenbaum C 09/20/2012

## 2012-09-21 MED ORDER — BISACODYL 10 MG RE SUPP
10.0000 mg | Freq: Every day | RECTAL | Status: DC | PRN
Start: 2012-09-21 — End: 2012-09-22
  Administered 2012-09-22: 10 mg via RECTAL
  Filled 2012-09-21: qty 1

## 2012-09-21 NOTE — Progress Notes (Signed)
  Subjective: Feels about the same.  +flatus.    Objective: Vital signs in last 24 hours: Temp:  [97.9 F (36.6 C)-98.7 F (37.1 C)] 98.3 F (36.8 C) (07/03 2118) Pulse Rate:  [64-87] 70 (07/03 2118) Resp:  [18-20] 20 (07/03 2118) BP: (138-169)/(75-84) 138/75 mmHg (07/03 2118) SpO2:  [93 %-99 %] 99 % (07/03 2118) Last BM Date: 09/07/12  Intake/Output from previous day: 07/02 0701 - 07/03 0700 In: 2840 [P.O.:2840] Out: 325 [Urine:325] Intake/Output this shift: Total I/O In: 120 [P.O.:120] Out: 400 [Urine:400]  General appearance: alert and no distress GI: soft, non-tender; bowel sounds normal; no masses,  no organomegaly  Lab Results:  No results found for this basename: WBC, HGB, HCT, PLT,  in the last 72 hours BMET No results found for this basename: NA, K, CL, CO2, GLUCOSE, BUN, CREATININE, CALCIUM,  in the last 72 hours PT/INR No results found for this basename: LABPROT, INR,  in the last 72 hours ABG No results found for this basename: PHART, PCO2, PO2, HCO3,  in the last 72 hours  Studies/Results: No results found.  Anti-infectives: Anti-infectives   Start     Dose/Rate Route Frequency Ordered Stop   09/19/12 1700  erythromycin (E-MYCIN) tablet 250 mg     250 mg Oral 2 times daily with meals 09/19/12 1252        Assessment/Plan: s/p * No surgery found * Continue to advance diet as tolerated.  Hopeful discharge tomorrow.  LOS: 6 days    Dio Giller C 09/21/2012

## 2012-09-22 MED ORDER — ERYTHROMYCIN BASE 250 MG PO TABS: 250.0000 mg | ORAL_TABLET | Freq: Two times a day (BID) | ORAL | Status: DC

## 2012-09-22 MED ORDER — HEPARIN SOD (PORK) LOCK FLUSH 100 UNIT/ML IV SOLN
500.0000 [IU] | INTRAVENOUS | Status: AC | PRN
  Administered 2012-09-22: 500 [IU]
  Filled 2012-09-22: qty 5

## 2012-09-22 NOTE — Discharge Summary (Signed)
Physician Discharge Summary  [name] MRN: [id number] DOB/AGE: 1950-05-31 62 y.o.  Admit date: 09/15/2012 Discharge date: 09/22/2012  Admission Diagnoses: Bowel dysmotility  Discharge Diagnoses: Same Active Problems:   * No active hospital problems. *   Discharged Condition: fair  Hospital Course: Patient is a 62 year old white male with multiple medical problems who presented back to the emergency room for abdominal pain, nausea, vomiting. He has had multiple episodes of a partial bowel obstruction due to multiple abdominal surgeries in the past, though recently they have resolved without surgery. He underwent an upper GI series with small bowel follow-through. There was no stricture present to warrant surgical intervention. He was started on erythromycin and Reglan. This seemed to help. He is tolerating a regular diet well and his bowel function has returned. He is being discharged home in fair and stable condition.  Discharge Exam: Blood pressure 148/76, pulse 68, temperature 99.3 F (37.4 C), temperature source Oral, resp. rate 20, height 5\' 9"  (1.753 m), weight 62.37 kg (137 lb 8 oz), SpO2 96.00%. General appearance: alert, cooperative and no distress Resp: clear to auscultation bilaterally Cardio: regular rate and rhythm, S1, S2 normal, no murmur, click, rub or gallop GI: soft, non-tender; bowel sounds normal; no masses,  no organomegaly  Disposition: 01-Home or Self Care   Future Appointments Provider Department Dept Phone   10/03/2012 10:30 AM [facility] Chair 7 [facility] [phone]   11/14/2012 10:30 AM [facility] Chair 7 [facility] [phone]   11/24/2012 11:30 AM [facility] Covering Provider [facility] [phone]   12/26/2012 10:30 AM [facility] Chair 7 [facility] [phone]   02/06/2013 10:30 AM [facility] Chair 7 [facility] [phone]       Medication List         alendronate 70 MG  tablet  Commonly known as:  FOSAMAX  Take 70 mg by mouth every 7 (seven) days. On sundays     atorvastatin 20 MG tablet  Commonly known as:  LIPITOR  Take 20 mg by mouth at bedtime.     cholecalciferol 1000 UNITS tablet  Commonly known as:  VITAMIN D  Take 1,000 Units by mouth every morning.     cyanocobalamin 1000 MCG/ML injection  Commonly known as:  (VITAMIN B-12)  Inject 1,000 mcg into the muscle every 30 (thirty) days.     cyclobenzaprine 10 MG tablet  Commonly known as:  FLEXERIL  Take 10 mg by mouth daily as needed for muscle spasms.     dexlansoprazole 60 MG capsule  Commonly known as:  DEXILANT  Take 60 mg by mouth every morning.     docusate sodium 100 MG capsule  Commonly known as:  COLACE  Take 100 mg by mouth 2 (two) times daily.     enoxaparin 60 MG/0.6ML injection  Commonly known as:  LOVENOX  Inject 60 mg into the skin every evening.     erythromycin 250 MG tablet  Commonly known as:  E-MYCIN  Take 1 tablet (250 mg total) by mouth 2 (two) times daily with a meal.     ferrous sulfate 325 (65 FE) MG tablet  Take 325 mg by mouth 2 (two) times daily.     folic acid 1 MG tablet  Commonly known as:  FOLVITE  Take 1 mg by mouth every morning.     HYDROcodone-acetaminophen 5-325 MG per tablet  Commonly known as:  NORCO  Take 1-2 tablets by mouth every  4 (four) hours as needed for pain.     JUICE PLUS FIBRE Liqd  Take 1 Can by mouth daily.     LORazepam 1 MG tablet  Commonly known as:  ATIVAN  Take 1 mg by mouth every 3 (three) hours as needed. anxiety     ondansetron 4 MG tablet  Commonly known as:  ZOFRAN  Take 4 mg by mouth daily as needed for nausea.     polyethylene glycol powder powder  Commonly known as:  GLYCOLAX/MIRALAX  Take 17 g by mouth as needed. For constipation     prochlorperazine 10 MG tablet  Commonly known as:  COMPAZINE  Take 10 mg by mouth every 6 (six) hours as needed. Nausea and Vomiting     psyllium 0.52 G capsule   Commonly known as:  REGULOID  Take 2.6 g by mouth 2 (two) times daily. *5 capsules taken twice daily*     sertraline 100 MG tablet  Commonly known as:  ZOLOFT  Take 100 mg by mouth at bedtime.     sucralfate 1 GM/10ML suspension  Commonly known as:  CARAFATE  Take 1 g by mouth 4 (four) times daily as needed (for relief).     thiamine 100 MG tablet  Take 100 mg by mouth every morning.     traMADol 50 MG tablet  Commonly known as:  ULTRAM  Take 1-2 tablets (50-100 mg total) by mouth every 4 (four) hours as needed. For pain     valsartan-hydrochlorothiazide 80-12.5 MG per tablet  Commonly known as:  DIOVAN-HCT  Take 1 tablet by mouth every morning.     Vitamin D (Ergocalciferol) 50000 UNITS Caps  Commonly known as:  DRISDOL  Take 50,000 Units by mouth once a week. Takes on Sunday           Follow-up Information   Follow up with Fabio Bering, MD. (As needed)    Contact information:   [address] [place] [address] [phone]       Signed: Franky Macho A 09/22/2012, 9:52 AM

## 2012-09-22 NOTE — Progress Notes (Signed)
Pt verbalizes understanding of d/c instructions, and new medication. Pt also understands that he is to follow up as needed with Dr. Leticia Penna. Pt has prescription for E-Mycin, which can be filled at any pharmacy. Port was deaccessed without complication. Pt has no questions at this time, and would like to eat lunch here and then leave. Pt was d/c via wheelchair at 1250, he plans to drive himself home. [name]

## 2012-09-28 ENCOUNTER — Encounter (HOSPITAL_COMMUNITY): Payer: Self-pay

## 2012-09-28 DIAGNOSIS — M129 Arthropathy, unspecified: Secondary | ICD-10-CM | POA: Diagnosis present

## 2012-09-28 DIAGNOSIS — I10 Essential (primary) hypertension: Secondary | ICD-10-CM | POA: Diagnosis present

## 2012-09-28 DIAGNOSIS — F411 Generalized anxiety disorder: Secondary | ICD-10-CM | POA: Diagnosis present

## 2012-09-28 DIAGNOSIS — Z903 Acquired absence of stomach [part of]: Secondary | ICD-10-CM

## 2012-09-28 DIAGNOSIS — M810 Age-related osteoporosis without current pathological fracture: Secondary | ICD-10-CM | POA: Diagnosis present

## 2012-09-28 DIAGNOSIS — Z85038 Personal history of other malignant neoplasm of large intestine: Secondary | ICD-10-CM

## 2012-09-28 DIAGNOSIS — E538 Deficiency of other specified B group vitamins: Secondary | ICD-10-CM | POA: Diagnosis present

## 2012-09-28 DIAGNOSIS — F17200 Nicotine dependence, unspecified, uncomplicated: Secondary | ICD-10-CM | POA: Diagnosis present

## 2012-09-28 DIAGNOSIS — K5989 Other specified functional intestinal disorders: Principal | ICD-10-CM | POA: Diagnosis present

## 2012-09-28 DIAGNOSIS — F1010 Alcohol abuse, uncomplicated: Secondary | ICD-10-CM | POA: Diagnosis present

## 2012-09-28 DIAGNOSIS — D649 Anemia, unspecified: Secondary | ICD-10-CM | POA: Diagnosis present

## 2012-09-28 DIAGNOSIS — Z79899 Other long term (current) drug therapy: Secondary | ICD-10-CM

## 2012-09-28 DIAGNOSIS — Z86711 Personal history of pulmonary embolism: Secondary | ICD-10-CM

## 2012-09-28 DIAGNOSIS — G4730 Sleep apnea, unspecified: Secondary | ICD-10-CM | POA: Diagnosis present

## 2012-09-28 NOTE — ED Notes (Signed)
Pt c/o sharp left middle abd pain since this afternoon with vomiting and diarrhea

## 2012-09-29 ENCOUNTER — Inpatient Hospital Stay (HOSPITAL_COMMUNITY)
Admission: EM | Admit: 2012-09-29 | Discharge: 2012-10-06 | DRG: 182 | Disposition: A | Discharge status: Home / Self Care | Payer: Federal, State, Local not specified - PPO | Attending: General Surgery | Admitting: General Surgery

## 2012-09-29 ENCOUNTER — Emergency Department (HOSPITAL_COMMUNITY): Payer: Federal, State, Local not specified - PPO

## 2012-09-29 ENCOUNTER — Encounter (HOSPITAL_COMMUNITY): Payer: Self-pay | Admitting: General Practice

## 2012-09-29 DIAGNOSIS — K56609 Unspecified intestinal obstruction, unspecified as to partial versus complete obstruction: Secondary | ICD-10-CM

## 2012-09-29 LAB — COMPREHENSIVE METABOLIC PANEL
BUN: 12 mg/dL (ref 6–23)
CO2: 21 mEq/L (ref 19–32)
Calcium: 8.6 mg/dL (ref 8.4–10.5)
Creatinine, Ser: 1 mg/dL (ref 0.50–1.35)
GFR calc Af Amer: >90 mL/min (ref >90)
GFR calc non Af Amer: 79 mL/min — ABNORMAL LOW (ref >90)
Glucose, Bld: 141 mg/dL — ABNORMAL HIGH (ref 70–99)
Sodium: 136 mEq/L (ref 135–145)
Total Protein: 6.3 g/dL (ref 6.0–8.3)

## 2012-09-29 LAB — CBC WITH DIFFERENTIAL/PLATELET
Eosinophils Absolute: 0.1 10*3/uL (ref 0.0–0.7)
Eosinophils Relative: 1 % (ref 0–5)
HCT: 35.9 % — ABNORMAL LOW (ref 39.0–52.0)
Lymphs Abs: 3.3 10*3/uL (ref 0.7–4.0)
MCH: 29.8 pg (ref 26.0–34.0)
MCV: 88.4 fL (ref 78.0–100.0)
Monocytes Absolute: 0.8 10*3/uL (ref 0.1–1.0)
Platelets: 262 10*3/uL (ref 150–400)
RBC: 4.06 MIL/uL — ABNORMAL LOW (ref 4.22–5.81)

## 2012-09-29 LAB — URINALYSIS, ROUTINE W REFLEX MICROSCOPIC
Nitrite: NEGATIVE
Protein, ur: NEGATIVE mg/dL
Specific Gravity, Urine: 1.02 (ref 1.005–1.030)
Urobilinogen, UA: 0.2 mg/dL (ref 0.0–1.0)

## 2012-09-29 LAB — LACTIC ACID, PLASMA: Lactic Acid, Venous: 2.3 mmol/L — ABNORMAL HIGH (ref 0.5–2.2)

## 2012-09-29 LAB — LIPASE, BLOOD: Lipase: 55 U/L (ref 11–59)

## 2012-09-29 MED ORDER — KCL IN DEXTROSE-NACL 20-5-0.45 MEQ/L-%-% IV SOLN
INTRAVENOUS | Status: DC
  Administered 2012-09-29 – 2012-10-05 (×12): via INTRAVENOUS

## 2012-09-29 MED ORDER — LACTATED RINGERS IV BOLUS (SEPSIS)
500.0000 mL | Freq: Once | INTRAVENOUS | Status: AC
  Administered 2012-09-29: 01:00:00 via INTRAVENOUS

## 2012-09-29 MED ORDER — ONDANSETRON HCL 4 MG/2ML IJ SOLN: 4.0000 mg | Freq: Three times a day (TID) | INTRAMUSCULAR | Status: DC | PRN

## 2012-09-29 MED ORDER — HYDROMORPHONE HCL PF 1 MG/ML IJ SOLN
1.0000 mg | Freq: Once | INTRAMUSCULAR | Status: AC
  Administered 2012-09-29: 1 mg via INTRAVENOUS
  Filled 2012-09-29: qty 1

## 2012-09-29 MED ORDER — DEXTROSE IN LACTATED RINGERS 5 % IV SOLN: INTRAVENOUS | Status: DC

## 2012-09-29 MED ORDER — HYDROMORPHONE HCL PF 1 MG/ML IJ SOLN
1.0000 mg | INTRAMUSCULAR | Status: DC | PRN
  Administered 2012-09-29 – 2012-10-06 (×67): 1 mg via INTRAVENOUS
  Filled 2012-09-29 (×68): qty 1

## 2012-09-29 MED ORDER — IOHEXOL 300 MG/ML  SOLN
100.0000 mL | Freq: Once | INTRAMUSCULAR | Status: AC | PRN
  Administered 2012-09-29: 100 mL via INTRAVENOUS

## 2012-09-29 MED ORDER — ONDANSETRON HCL 4 MG/2ML IJ SOLN
4.0000 mg | Freq: Four times a day (QID) | INTRAMUSCULAR | Status: DC | PRN
  Administered 2012-09-29 – 2012-10-06 (×15): 4 mg via INTRAVENOUS
  Filled 2012-09-29 (×15): qty 2

## 2012-09-29 MED ORDER — ONDANSETRON HCL 4 MG/2ML IJ SOLN
4.0000 mg | Freq: Once | INTRAMUSCULAR | Status: AC
  Administered 2012-09-29: 4 mg via INTRAVENOUS
  Filled 2012-09-29: qty 2

## 2012-09-29 MED ORDER — PROMETHAZINE HCL 25 MG/ML IJ SOLN
25.0000 mg | Freq: Once | INTRAMUSCULAR | Status: AC
  Administered 2012-09-29: 25 mg via INTRAVENOUS
  Filled 2012-09-29: qty 1

## 2012-09-29 MED ORDER — ACETAMINOPHEN 325 MG PO TABS
650.0000 mg | ORAL_TABLET | Freq: Four times a day (QID) | ORAL | Status: DC | PRN
  Administered 2012-09-30 – 2012-10-01 (×2): 650 mg via ORAL
  Filled 2012-09-29 (×2): qty 2

## 2012-09-29 MED ORDER — ENOXAPARIN SODIUM 60 MG/0.6ML ~~LOC~~ SOLN
60.0000 mg | Freq: Every evening | SUBCUTANEOUS | Status: DC
  Administered 2012-09-29 – 2012-10-05 (×7): 60 mg via SUBCUTANEOUS
  Filled 2012-09-29 (×7): qty 0.6

## 2012-09-29 MED ORDER — IOHEXOL 300 MG/ML  SOLN
50.0000 mL | Freq: Once | INTRAMUSCULAR | Status: AC | PRN
  Administered 2012-09-29: 50 mL via ORAL

## 2012-09-29 MED ORDER — PROMETHAZINE HCL 25 MG/ML IJ SOLN: 25.0000 mg | Freq: Four times a day (QID) | INTRAMUSCULAR | Status: DC | PRN

## 2012-09-29 MED ORDER — LORAZEPAM 2 MG/ML IJ SOLN: 1.0000 mg | INTRAMUSCULAR | Status: DC | PRN

## 2012-09-29 MED ORDER — PANTOPRAZOLE SODIUM 40 MG IV SOLR
40.0000 mg | Freq: Every day | INTRAVENOUS | Status: DC
  Administered 2012-09-29 – 2012-10-05 (×7): 40 mg via INTRAVENOUS
  Filled 2012-09-29 (×7): qty 40

## 2012-09-29 MED ORDER — ACETAMINOPHEN 650 MG RE SUPP: 650.0000 mg | Freq: Four times a day (QID) | RECTAL | Status: DC | PRN

## 2012-09-29 MED ORDER — HYDROMORPHONE HCL PF 1 MG/ML IJ SOLN
1.0000 mg | INTRAMUSCULAR | Status: DC | PRN
  Administered 2012-09-29 (×2): 1 mg via INTRAVENOUS
  Filled 2012-09-29 (×2): qty 1

## 2012-09-29 NOTE — H&P (Signed)
[name] is an 62 y.o. male.   Chief Complaint: Nausea and vomiting HPI: Patient is a 62 year old white male who is well known to the surgical service who presents after being discharged 6 days ago with nausea and vomiting. He has an extensive and complicated history with multiple abdominal surgeries having been done. When was last in the hospital, he had an upper GI series with small bowel follow-through which showed no surgical obstructive process present. 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 Abdominal surgery on him would be of considerable difficulty. He states he has bowel movements every day or 2. That this is normal for him. He last had a bowel movement on 09/27/2012. On 09/28/2012, he had 2 episodes of nausea and vomiting. He decided to bring himself to the emergency room. He placed his own NG tube in the emergency room. A CT scan was performed which revealed proximal dilated bowel with distal collapsed bowel. The findings of which have been seen multiple times in the past.  Past Medical History  Diagnosis Date  . Pulmonary embolism 02/2010  . Hypertension   . Osteoporosis   . Arthritis   . TIA (transient ischemic attack) 10/11  . ETOH abuse     quit 03/2010  . 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 Personal history of PE (pulmonary embolism) 10/01/2010  . Blood transfusion   . S/P endoscopy September 28, 2010    erosive reflux esophagitis, Billroth I anatomy  . Shortness of breath   . Sleep apnea   . Recurrent upper respiratory infection (URI)   . Anxiety   . Pneumonia   . Anemia   . Ileus   . Chronic abdominal pain   . GERD (gastroesophageal reflux disease)   . Chest pain at rest   . Erosive esophagitis   . Vitamin B12 deficiency   . Bowel obstruction 05/13/2012    Recurrent  . Hx of Clostridium difficile infection 01/2012  . 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 Hernia repair       right inguinal  . Appendectomy  1980s  . Cholecystectomy  1980s  . Portacath placement    . Abdominal sugery      for bowel obstruction x 8, all in 1980s, except for one in 07/2010  . Esophagogastroduodenoscopy  09/28/2010  . Esophagogastroduodenoscopy  12/01/2010    Cervical web status post dilation, erosive esophagitis, B1 hemigastrectomy, inflamed anastomosis  . Colonoscopy  03/18/2011    anastomosis at 35cm. Several adenomatous polyps removed. Sigmoid diverticulosis. Next TCS 02/2013  . Esophagogastroduodenoscopy  04/16/2011    excoriation at GEJ c/w trauma/M-W tear, friable gastric anastomosis, dilation efferent limb  . Billroth 1 hemigastrectomy  1980s    per patient for benign duodenal tumor  . Esophagogastroduodenoscopy (egd) with esophageal dilation  02/25/2012    [id number] esophageal web-s/p dilation anddisruption as described above. Status post prior gastric with Billroth I configuration. Abnormal gastric mucosa at the anastomosis. Gastric biopsy showed mild chronic inflammation but no H. pylori   . Colonoscopy N/A 07/24/2012    Procedure: COLONOSCOPY;  Surgeon: Corbin Ade, MD;  Location: [facility];  Service: Endoscopy;  Laterality: N/A;  3:00  . Colon surgery  May 2012    left hemicolectomy, colon cancer found at time of surgery for bowel obstruction  . Cardiac catheterization  07/17/2012    Family History  Problem Relation Age  of Onset  . Hypertension Mother   . Arthritis Mother   . Pneumonia Mother   . Hypertension Father   . Heart attack Father    Social History:  reports that he has been smoking Cigarettes.  He has a 20 pack-year smoking history. He has never used smokeless tobacco. He reports that he does not drink alcohol or use illicit drugs.  Allergies: No Known Allergies  Medications Prior to Admission  Medication Sig Dispense Refill  . alendronate (FOSAMAX) 70 MG tablet Take 70 mg by mouth every 7 (seven) days. On sundays      . atorvastatin (LIPITOR)  20 MG tablet Take 20 mg by mouth at bedtime.       . cholecalciferol (VITAMIN D) 1000 UNITS tablet Take 1,000 Units by mouth every morning.      . cyanocobalamin (,VITAMIN B-12,) 1000 MCG/ML injection Inject 1,000 mcg into the muscle every 30 (thirty) days.      . cyclobenzaprine (FLEXERIL) 10 MG tablet Take 10 mg by mouth daily as needed for muscle spasms.       [name] dexlansoprazole (DEXILANT) 60 MG capsule Take 60 mg by mouth every morning.      . docusate sodium (COLACE) 100 MG capsule Take 100 mg by mouth 2 (two) times daily.      [name] enoxaparin (LOVENOX) 60 MG/0.6ML injection Inject 60 mg into the skin every evening.       [name] ENSURE PLUS (ENSURE PLUS) LIQD Take 237 mLs by mouth daily.      [name] erythromycin (E-MYCIN) 250 MG tablet Take 1 tablet (250 mg total) by mouth 2 (two) times daily with a meal.  60 tablet  1  . ferrous sulfate 325 (65 FE) MG tablet Take 325 mg by mouth 2 (two) times daily.      . folic acid (FOLVITE) 1 MG tablet Take 1 mg by mouth every morning.       [name] HYDROcodone-acetaminophen (NORCO) 5-325 MG per tablet Take 1-2 tablets by mouth every 4 (four) hours as needed for pain.  35 tablet  0  . LORazepam (ATIVAN) 1 MG tablet Take 1 mg by mouth every 3 (three) hours as needed. anxiety      . ondansetron (ZOFRAN) 4 MG tablet Take 4 mg by mouth daily as needed for nausea.      . polyethylene glycol powder (GLYCOLAX/MIRALAX) powder Take 17 g by mouth as needed. For constipation      . prochlorperazine (COMPAZINE) 10 MG tablet Take 10 mg by mouth every 6 (six) hours as needed. Nausea and Vomiting      . psyllium (REGULOID) 0.52 G capsule Take 2.6 g by mouth 2 (two) times daily. *5 capsules taken twice daily*      . sertraline (ZOLOFT) 100 MG tablet Take 100 mg by mouth at bedtime.      . sucralfate (CARAFATE) 1 GM/10ML suspension Take 1 g by mouth 4 (four) times daily as needed (for relief).       . thiamine 100 MG tablet Take 100 mg by mouth every morning.       . traMADol (ULTRAM) 50 MG  tablet Take 1-2 tablets (50-100 mg total) by mouth every 4 (four) hours as needed. For pain  30 tablet  1  . valsartan-hydrochlorothiazide (DIOVAN-HCT) 80-12.5 MG per tablet Take 1 tablet by mouth every morning.       . Vitamin D, Ergocalciferol, (DRISDOL) 50000 UNITS CAPS Take 50,000 Units by mouth once a week. Takes  on Sunday        Results for orders placed during the hospital encounter of 09/29/12 (from the past 48 hour(s))  CBC WITH DIFFERENTIAL     Status: Abnormal   Collection Time    09/29/12 12:16 AM      Result Value Range   WBC 9.4  4.0 - 10.5 K/uL   RBC 4.06 (*) 4.22 - 5.81 MIL/uL   Hemoglobin 12.1 (*) 13.0 - 17.0 g/dL   HCT 16.1 (*) 09.6 - 04.5 %   MCV 88.4  78.0 - 100.0 fL   MCH 29.8  26.0 - 34.0 pg   MCHC 33.7  30.0 - 36.0 g/dL   RDW 40.9  81.1 - 91.4 %   Platelets 262  150 - 400 K/uL   Neutrophils Relative % 55  43 - 77 %   Neutro Abs 5.2  1.7 - 7.7 K/uL   Lymphocytes Relative 35  12 - 46 %   Lymphs Abs 3.3  0.7 - 4.0 K/uL   Monocytes Relative 9  3 - 12 %   Monocytes Absolute 0.8  0.1 - 1.0 K/uL   Eosinophils Relative 1  0 - 5 %   Eosinophils Absolute 0.1  0.0 - 0.7 K/uL   Basophils Relative 0  0 - 1 %   Basophils Absolute 0.0  0.0 - 0.1 K/uL  COMPREHENSIVE METABOLIC PANEL     Status: Abnormal   Collection Time    09/29/12 12:16 AM      Result Value Range   Sodium 136  135 - 145 mEq/L   Potassium 3.6  3.5 - 5.1 mEq/L   Chloride 104  96 - 112 mEq/L   CO2 21  19 - 32 mEq/L   Glucose, Bld 141 (*) 70 - 99 mg/dL   BUN 12  6 - 23 mg/dL   Creatinine, Ser 7.82  0.50 - 1.35 mg/dL   Calcium 8.6  8.4 - 95.6 mg/dL   Total Protein 6.3  6.0 - 8.3 g/dL   Albumin 3.3 (*) 3.5 - 5.2 g/dL   AST 43 (*) 0 - 37 U/L   ALT 34  0 - 53 U/L   Alkaline Phosphatase 83  39 - 117 U/L   Total Bilirubin 0.2 (*) 0.3 - 1.2 mg/dL   GFR calc non Af Amer 79 (*) >90 mL/min   GFR calc Af Amer >90  >90 mL/min   Comment:            The eGFR has been calculated     using the CKD EPI equation.      This calculation has not been     validated in all clinical     situations.     eGFR's persistently     <90 mL/min signify     possible Chronic Kidney Disease.  LIPASE, BLOOD     Status: None   Collection Time    09/29/12 12:16 AM      Result Value Range   Lipase 55  11 - 59 U/L  LACTIC ACID, PLASMA     Status: Abnormal   Collection Time    09/29/12 12:16 AM      Result Value Range   Lactic Acid, Venous 2.3 (*) 0.5 - 2.2 mmol/L  URINALYSIS, ROUTINE W REFLEX MICROSCOPIC     Status: Abnormal   Collection Time    09/29/12  2:28 AM      Result Value Range   Color, Urine YELLOW  YELLOW  APPearance CLEAR  CLEAR   Specific Gravity, Urine 1.020  1.005 - 1.030   pH 5.5  5.0 - 8.0   Glucose, UA 100 (*) NEGATIVE mg/dL   Hgb urine dipstick NEGATIVE  NEGATIVE   Bilirubin Urine NEGATIVE  NEGATIVE   Ketones, ur NEGATIVE  NEGATIVE mg/dL   Protein, ur NEGATIVE  NEGATIVE mg/dL   Urobilinogen, UA 0.2  0.0 - 1.0 mg/dL   Nitrite NEGATIVE  NEGATIVE   Leukocytes, UA NEGATIVE  NEGATIVE   Comment: MICROSCOPIC NOT DONE ON URINES WITH NEGATIVE PROTEIN, BLOOD, LEUKOCYTES, NITRITE, OR GLUCOSE <1000 mg/dL.   Ct Abdomen Pelvis W Contrast  09/29/2012   *RADIOLOGY REPORT*  Clinical Data: Abdominal pain, vomiting, diarrhea.  CT ABDOMEN AND PELVIS WITH CONTRAST  Technique:  Multidetector CT imaging of the abdomen and pelvis was performed following the standard protocol during bolus administration of intravenous contrast.  Contrast: 50mL OMNIPAQUE IOHEXOL 300 MG/ML  SOLN, OMNIPAQUE IOHEXOL 300 MG/ML  SOLN  Comparison: 06/12/2012  Findings: Emphysematous changes in the lungs with infiltration or atelectasis in the lung bases.  The distal esophagus is gas filled and distended.  This could represent achalasia or obstruction.  Surgical absence of the gallbladder.  Mild dilatation of bile ducts is likely postoperative.  Tiny sub-centimeter cyst in the medial segment left lobe of the liver is stable since  previous study. Pancreatic duct appears upper limits of normal.  Mild dilatation is not excluded.  This appears stable.  No mass lesions identified. Spleen size is normal.  Adrenal glands are normal. Cysts in the right kidney are stable.  No hydronephrosis of either kidney. Calcification of the abdominal aorta.  Inferior vena cava is flattened which may correspond with hypovolemia.  Postoperative changes in the stomach with apparent partial gastrectomy.  Significant small bowel dilatation with decompression of the terminal ileum.  There is evidence of wall thickening of the terminal ileum.  Changes are consistent with obstruction and may be due to inflammatory bowel disease or stricture.  Stool filled colon is not abnormally distended.  Pelvis:  Prostate gland is enlarged, measuring 5.5 x 3.6 cm. Bladder wall is not thickened.  No free or loculated pelvic fluid collections.  Appendix is not identified.  No free or loculated pelvic fluid collections.  No free air.  Normal alignment of the lumbar vertebrae.  IMPRESSION: Distended small bowel with decompressed ileum suggesting small bowel obstruction.  There is ileal wall thickening suggesting possible inflammatory bowel disease or stricture.  The esophagus is also distended and distal obstruction or reflux are not excluded. Additional incidental findings are stable.   Original Report Authenticated By: Burman Nieves, M.D.   Dg Abd Acute W/chest  09/29/2012   *RADIOLOGY REPORT*  Clinical Data: Abdominal pain, nausea.  ACUTE ABDOMEN SERIES (ABDOMEN 2 VIEW & CHEST 1 VIEW)  Comparison: 09/15/2012  Findings: Left Port-A-Cath remains in place, unchanged.  Heart is normal size.  Tortuosity of the thoracic aorta.  Lungs are clear. No effusions.  No acute bony abnormality.  Surgical changes in the abdomen and.  Gas within mildly distended bowel loops.  Findings are similar to prior study.  While this may reflect ileus, I cannot exclude a distal obstruction.  No free air.  No organomegaly or suspicious calcification.  No acute bony abnormality.  IMPRESSION: Moderate gaseous distention of bowel, similar to prior study.  This may reflect ileus although distal colonic obstruction cannot be excluded.  No acute cardiopulmonary disease.   Original Report Authenticated By: Charlett Nose,  M.D.    Review of Systems  Constitutional: Positive for malaise/fatigue.  Gastrointestinal: Positive for nausea, vomiting and abdominal pain.  Skin: Negative.   Neurological: Positive for weakness.  All other systems reviewed and are negative.    Blood pressure 148/93, pulse 62, temperature 98.2 F (36.8 C), temperature source Oral, resp. rate 20, height 5\' 9"  (1.753 m), weight 61.871 kg (136 lb 6.4 oz), SpO2 96.00%. Physical Exam  Constitutional: He is oriented to person, place, and time. He appears well-developed and well-nourished.  HENT:  Head: Normocephalic and atraumatic.  Neck: Normal range of motion. Neck supple.  Cardiovascular: Normal rate, regular rhythm and normal heart sounds.   Respiratory: Effort normal and breath sounds normal.  GI: Soft. He exhibits no distension. There is no tenderness.   Occasional bowel sounds appreciated.  Neurological: He is alert and oriented to person, place, and time.  Skin: Skin is warm and dry.     Assessment/Plan Impression: Small bowel obstruction, dysfunction. Plan: Will admit patient for control of his nausea and vomiting. Will continue to nasogastric tube over the next several days. We'll try to educate the patient concerning his frequent readmissions to multiple hospitals throughout the area. 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 No need for acute surgical intervention at this time.  Chayson Charters A 09/29/2012, 1:32 PM

## 2012-09-29 NOTE — ED Provider Notes (Signed)
History    CSN: [id number] Arrival date & time 09/28/12  2344  First MD Initiated Contact with Patient 09/29/12 0009     Chief Complaint  Patient presents with  . Abdominal Pain   HPI [name] is a 62 y.o. male presenting with severe, sharp, left lower and left middle abdominal pain this started this afternoon but associated with vomiting and nausea. 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   Patient says he feels like this is an obstruction. Last passed flatus yesterday morning, last bowel movement was Wednesday. Patient last ate at 6:30 yesterday evening and then vomited at 7:00. Denies any chest pains, shortness of breath, headache, fever, chills.  Past Medical History  Diagnosis Date  . Pulmonary embolism 02/2010  . Hypertension   . Osteoporosis   . Arthritis   . TIA (transient ischemic attack) 10/11  . ETOH abuse     quit 03/2010  . 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 Personal history of PE (pulmonary embolism) 10/01/2010  . Blood transfusion   . S/P endoscopy September 28, 2010    erosive reflux esophagitis, Billroth I anatomy  . Shortness of breath   . Sleep apnea   . Recurrent upper respiratory infection (URI)   . Anxiety   . Pneumonia   . Anemia   . Ileus   . Chronic abdominal pain   . GERD (gastroesophageal reflux disease)   . Chest pain at rest   . Erosive esophagitis   . Vitamin B12 deficiency   . Bowel obstruction 05/13/2012    Recurrent  . Hx of Clostridium difficile infection 01/2012  . 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 Hernia repair      right inguinal  . Appendectomy  1980s  . Cholecystectomy  1980s  . Portacath placement    . Abdominal sugery      for bowel obstruction x 8, all in 1980s, except for one in  07/2010  . Esophagogastroduodenoscopy  09/28/2010  . Esophagogastroduodenoscopy  12/01/2010    Cervical web status post dilation, erosive esophagitis, B1 hemigastrectomy, inflamed anastomosis  . Colonoscopy  03/18/2011    anastomosis at 35cm. Several adenomatous polyps removed. Sigmoid diverticulosis. Next TCS 02/2013  . Esophagogastroduodenoscopy  04/16/2011    excoriation at [facility] c/w trauma/M-W tear, friable gastric anastomosis, dilation efferent limb  . Billroth 1 hemigastrectomy  1980s    per patient for benign duodenal tumor  . Esophagogastroduodenoscopy (egd) with esophageal dilation  02/25/2012    BJY:NWGNFAOZ esophageal web-s/p dilation anddisruption as described above. Status post prior gastric with Billroth I configuration. Abnormal gastric mucosa at the anastomosis. Gastric biopsy showed mild chronic inflammation but no H. pylori   . Colonoscopy N/A 07/24/2012    Procedure: COLONOSCOPY;  Surgeon: Corbin Ade, MD;  Location: [facility];  Service: Endoscopy;  Laterality: N/A;  3:00  . Colon surgery  May 2012    left hemicolectomy, colon cancer found at time of surgery for bowel obstruction  . Cardiac catheterization  07/17/2012   Family History  Problem Relation Age of Onset  . Hypertension Mother   . Arthritis Mother   . Pneumonia Mother   . Hypertension Father   . Heart attack Father    History  Substance Use  Topics  . Smoking status: Current Every Day Smoker -- 0.50 packs/day for 40 years    Types: Cigarettes  . Smokeless tobacco: Never Used  . Alcohol Use: No     Comment: last drink was Apr 16 2010    Review of Systems At least 10pt or greater review of systems completed and are negative except where specified in the HPI.  Allergies  Review of patient's allergies indicates no known allergies.  Home Medications   Current Outpatient Rx  Name  Route  Sig  Dispense  Refill  . alendronate (FOSAMAX) 70 MG tablet   Oral   Take 70 mg by mouth every 7 (seven) days. On  sundays         . atorvastatin (LIPITOR) 20 MG tablet   Oral   Take 20 mg by mouth at bedtime.          . cholecalciferol (VITAMIN D) 1000 UNITS tablet   Oral   Take 1,000 Units by mouth every morning.         . cyanocobalamin (,VITAMIN B-12,) 1000 MCG/ML injection   Intramuscular   Inject 1,000 mcg into the muscle every 30 (thirty) days.         . cyclobenzaprine (FLEXERIL) 10 MG tablet   Oral   Take 10 mg by mouth daily as needed for muscle spasms.          [name] dexlansoprazole (DEXILANT) 60 MG capsule   Oral   Take 60 mg by mouth every morning.         . docusate sodium (COLACE) 100 MG capsule   Oral   Take 100 mg by mouth 2 (two) times daily.         [name] enoxaparin (LOVENOX) 60 MG/0.6ML injection   Subcutaneous   Inject 60 mg into the skin every evening.          [name] erythromycin (E-MYCIN) 250 MG tablet   Oral   Take 1 tablet (250 mg total) by mouth 2 (two) times daily with a meal.   60 tablet   1   . ferrous sulfate 325 (65 FE) MG tablet   Oral   Take 325 mg by mouth 2 (two) times daily.         . folic acid (FOLVITE) 1 MG tablet   Oral   Take 1 mg by mouth every morning.          [name] HYDROcodone-acetaminophen (NORCO) 5-325 MG per tablet   Oral   Take 1-2 tablets by mouth every 4 (four) hours as needed for pain.   35 tablet   0   . LORazepam (ATIVAN) 1 MG tablet   Oral   Take 1 mg by mouth every 3 (three) hours as needed. anxiety         . Nutritional Supplements (JUICE PLUS FIBRE) LIQD   Oral   Take 1 Can by mouth daily.         . ondansetron (ZOFRAN) 4 MG tablet   Oral   Take 4 mg by mouth daily as needed for nausea.         . polyethylene glycol powder (GLYCOLAX/MIRALAX) powder   Oral   Take 17 g by mouth as needed. For constipation         . prochlorperazine (COMPAZINE) 10 MG tablet   Oral   Take 10 mg by mouth every 6 (six) hours as needed. Nausea and Vomiting         .  psyllium (REGULOID) 0.52 G capsule   Oral    Take 2.6 g by mouth 2 (two) times daily. *5 capsules taken twice daily*         . sertraline (ZOLOFT) 100 MG tablet   Oral   Take 100 mg by mouth at bedtime.         . sucralfate (CARAFATE) 1 GM/10ML suspension   Oral   Take 1 g by mouth 4 (four) times daily as needed (for relief).          . thiamine 100 MG tablet   Oral   Take 100 mg by mouth every morning.          . traMADol (ULTRAM) 50 MG tablet   Oral   Take 1-2 tablets (50-100 mg total) by mouth every 4 (four) hours as needed. For pain   30 tablet   1   . valsartan-hydrochlorothiazide (DIOVAN-HCT) 80-12.5 MG per tablet   Oral   Take 1 tablet by mouth every morning.          . Vitamin D, Ergocalciferol, (DRISDOL) 50000 UNITS CAPS   Oral   Take 50,000 Units by mouth once a week. Takes on Sunday          BP 170/100  Pulse 100  Temp(Src) 98.9 F (37.2 C) (Oral)  Resp 18  Ht 5\' 9"  (1.753 m)  Wt 135 lb (61.236 kg)  BMI 19.93 kg/m2  SpO2 100% Physical Exam  Nursing notes reviewed.  Electronic medical record reviewed. VITAL SIGNS:   Filed Vitals:   09/28/12 2355  BP: 170/100  Pulse: 100  Temp: 98.9 F (37.2 C)  TempSrc: Oral  Resp: 18  Height: 5\' 9"  (1.753 m)  Weight: 135 lb (61.236 kg)  SpO2: 100%   CONSTITUTIONAL: Awake, oriented, appears non-toxic HENT: Atraumatic, normocephalic, oral mucosa pink and moist, airway patent. Nares patent without drainage. External ears normal. EYES: Conjunctiva clear, EOMI, PERRLA NECK: Trachea midline, non-tender, supple CARDIOVASCULAR: Normal heart rate, Normal rhythm, No murmurs, rubs, gallops PULMONARY/CHEST: Clear to auscultation, no rhonchi, wheezes, or rales. Symmetrical breath sounds. Non-tender. ABDOMINAL: Non-distended, soft, tenderness to palpation in the left lower and left middle areas of the abdomen, generalized tenderness.  BS normal. NEUROLOGIC: Non-focal, moving all four extremities, no gross sensory or motor deficits. EXTREMITIES: No clubbing,  cyanosis, or edema SKIN: Warm, Dry, No erythema, No rash  ED Course  Procedures (including critical care time)  Date: 09/29/2012  Rate: 75  Rhythm: normal sinus rhythm  QRS Axis: normal  Intervals: normal  ST/T Wave abnormalities: normal  Conduction Disutrbances: none  Narrative Interpretation: NSR, no change from prior ECG     Labs Reviewed  CBC WITH DIFFERENTIAL - Abnormal; Notable for the following:    RBC 4.06 (*)    Hemoglobin 12.1 (*)    HCT 35.9 (*)    All other components within normal limits  COMPREHENSIVE METABOLIC PANEL - Abnormal; Notable for the following:    Glucose, Bld 141 (*)    Albumin 3.3 (*)    AST 43 (*)    Total Bilirubin 0.2 (*)    GFR calc non Af Amer 79 (*)    All other components within normal limits  URINALYSIS, ROUTINE W REFLEX MICROSCOPIC - Abnormal; Notable for the following:    Glucose, UA 100 (*)    All other components within normal limits  LACTIC ACID, PLASMA - Abnormal; Notable for the following:    Lactic Acid, Venous 2.3 (*)  All other components within normal limits  LIPASE, BLOOD   Ct Abdomen Pelvis W Contrast  09/29/2012   *RADIOLOGY REPORT*  Clinical Data: Abdominal pain, vomiting, diarrhea.  CT ABDOMEN AND PELVIS WITH CONTRAST  Technique:  Multidetector CT imaging of the abdomen and pelvis was performed following the standard protocol during bolus administration of intravenous contrast.  Contrast: 50mL OMNIPAQUE IOHEXOL 300 MG/ML  SOLN, OMNIPAQUE IOHEXOL 300 MG/ML  SOLN  Comparison: 06/12/2012  Findings: Emphysematous changes in the lungs with infiltration or atelectasis in the lung bases.  The distal esophagus is gas filled and distended.  This could represent achalasia or obstruction.  Surgical absence of the gallbladder.  Mild dilatation of bile ducts is likely postoperative.  Tiny sub-centimeter cyst in the medial segment left lobe of the liver is stable since previous study. Pancreatic duct appears upper limits of normal.   Mild dilatation is not excluded.  This appears stable.  No mass lesions identified. Spleen size is normal.  Adrenal glands are normal. Cysts in the right kidney are stable.  No hydronephrosis of either kidney. Calcification of the abdominal aorta.  Inferior vena cava is flattened which may correspond with hypovolemia.  Postoperative changes in the stomach with apparent partial gastrectomy.  Significant small bowel dilatation with decompression of the terminal ileum.  There is evidence of wall thickening of the terminal ileum.  Changes are consistent with obstruction and may be due to inflammatory bowel disease or stricture.  Stool filled colon is not abnormally distended.  Pelvis:  Prostate gland is enlarged, measuring 5.5 x 3.6 cm. Bladder wall is not thickened.  No free or loculated pelvic fluid collections.  Appendix is not identified.  No free or loculated pelvic fluid collections.  No free air.  Normal alignment of the lumbar vertebrae.  IMPRESSION: Distended small bowel with decompressed ileum suggesting small bowel obstruction.  There is ileal wall thickening suggesting possible inflammatory bowel disease or stricture.  The esophagus is also distended and distal obstruction or reflux are not excluded. Additional incidental findings are stable.   Original Report Authenticated By: Burman Nieves, M.D.   Dg Abd Acute W/chest  09/29/2012   *RADIOLOGY REPORT*  Clinical Data: Abdominal pain, nausea.  ACUTE ABDOMEN SERIES (ABDOMEN 2 VIEW & CHEST 1 VIEW)  Comparison: 09/15/2012  Findings: Left Port-A-Cath remains in place, unchanged.  Heart is normal size.  Tortuosity of the thoracic aorta.  Lungs are clear. No effusions.  No acute bony abnormality.  Surgical changes in the abdomen and.  Gas within mildly distended bowel loops.  Findings are similar to prior study.  While this may reflect ileus, I cannot exclude a distal obstruction.  No free air. No organomegaly or suspicious calcification.  No acute bony  abnormality.  IMPRESSION: Moderate gaseous distention of bowel, similar to prior study.  This may reflect ileus although distal colonic obstruction cannot be excluded.  No acute cardiopulmonary disease.   Original Report Authenticated By: Charlett Nose, M.D.   1. Small bowel obstruction     MDM  Pt presents with SBO confirmed on CT.  Pain managed, no acute surgical emergency, pt made NPO, NG placed (by patient) and discussed with Dr. Lovell Sheehan.  Holding orders placed and admitted to tele.  Dr. Lovell Sheehan will see the pt in the A.M.  Lacate only marginally increased, don't think there is ischemia at this time.  Jones Skene, MD 09/29/12 [phone]

## 2012-09-29 NOTE — ED Notes (Signed)
Patient is aware that he is NPO. Patient also aware that he might be going to surgery.

## 2012-09-30 LAB — BASIC METABOLIC PANEL
BUN: 4 mg/dL — ABNORMAL LOW (ref 6–23)
CO2: 29 mEq/L (ref 19–32)
Calcium: 8.5 mg/dL (ref 8.4–10.5)
Chloride: 102 mEq/L (ref 96–112)
Creatinine, Ser: 0.9 mg/dL (ref 0.50–1.35)
GFR calc Af Amer: >90 mL/min (ref >90)

## 2012-09-30 LAB — CBC
Hemoglobin: 12.6 g/dL — ABNORMAL LOW (ref 13.0–17.0)
MCH: 30.4 pg (ref 26.0–34.0)
MCV: 87.7 fL (ref 78.0–100.0)
RBC: 4.14 MIL/uL — ABNORMAL LOW (ref 4.22–5.81)

## 2012-09-30 MED ORDER — BIOTENE DRY MOUTH MT LIQD
15.0000 mL | Freq: Four times a day (QID) | OROMUCOSAL | Status: DC
  Administered 2012-10-01 – 2012-10-06 (×23): 15 mL via OROMUCOSAL

## 2012-09-30 MED ORDER — CHLORHEXIDINE GLUCONATE 0.12 % MT SOLN
15.0000 mL | Freq: Two times a day (BID) | OROMUCOSAL | Status: DC
  Administered 2012-09-30 – 2012-10-06 (×12): 15 mL via OROMUCOSAL
  Filled 2012-09-30 (×12): qty 15

## 2012-09-30 NOTE — Progress Notes (Signed)
Subjective: Status quo, no bowel movement or flatus yet. Denies any abdominal pain.  Objective: Vital signs in last 24 hours: Temp:  [98.3 F (36.8 C)-99.7 F (37.6 C)] 99.7 F (37.6 C) (07/12 0608) Pulse Rate:  [58-82] 82 (07/12 0608) Resp:  [20-21] 20 (07/12 0608) BP: (142-156)/(81-90) 156/90 mmHg (07/12 0608) SpO2:  [92 %-97 %] 92 % (07/12 0608) Last BM Date: 09/27/12  Intake/Output from previous day: 07/11 0701 - 07/12 0700 In: 0  Out: 2175 [Urine:1675; Emesis/NG output:500] Intake/Output this shift:    General appearance: alert, cooperative and no distress Resp: clear to auscultation bilaterally Cardio: regular rate and rhythm, S1, S2 normal, no murmur, click, rub or gallop GI: Soft, nondistended. No rigidity noted. Minimal bowel sounds appreciated.  Lab Results:   Recent Labs  09/29/12 0016 09/30/12 0511  WBC 9.4 13.8*  HGB 12.1* 12.6*  HCT 35.9* 36.3*  PLT 262 216   BMET  Recent Labs  09/29/12 0016 09/30/12 0511  NA 136 137  K 3.6 4.1  CL 104 102  CO2 21 29  GLUCOSE 141* 98  BUN 12 4*  CREATININE 1.00 0.90  CALCIUM 8.6 8.5   PT/INR No results found for this basename: LABPROT, INR,  in the last 72 hours  Studies/Results: Ct Abdomen Pelvis W Contrast  09/29/2012   *RADIOLOGY REPORT*  Clinical Data: Abdominal pain, vomiting, diarrhea.  CT ABDOMEN AND PELVIS WITH CONTRAST  Technique:  Multidetector CT imaging of the abdomen and pelvis was performed following the standard protocol during bolus administration of intravenous contrast.  Contrast: 50mL OMNIPAQUE IOHEXOL 300 MG/ML  SOLN, OMNIPAQUE IOHEXOL 300 MG/ML  SOLN  Comparison: 06/12/2012  Findings: Emphysematous changes in the lungs with infiltration or atelectasis in the lung bases.  The distal esophagus is gas filled and distended.  This could represent achalasia or obstruction.  Surgical absence of the gallbladder.  Mild dilatation of bile ducts is likely postoperative.  Tiny sub-centimeter  cyst in the medial segment left lobe of the liver is stable since previous study. Pancreatic duct appears upper limits of normal.  Mild dilatation is not excluded.  This appears stable.  No mass lesions identified. Spleen size is normal.  Adrenal glands are normal. Cysts in the right kidney are stable.  No hydronephrosis of either kidney. Calcification of the abdominal aorta.  Inferior vena cava is flattened which may correspond with hypovolemia.  Postoperative changes in the stomach with apparent partial gastrectomy.  Significant small bowel dilatation with decompression of the terminal ileum.  There is evidence of wall thickening of the terminal ileum.  Changes are consistent with obstruction and may be due to inflammatory bowel disease or stricture.  Stool filled colon is not abnormally distended.  Pelvis:  Prostate gland is enlarged, measuring 5.5 x 3.6 cm. Bladder wall is not thickened.  No free or loculated pelvic fluid collections.  Appendix is not identified.  No free or loculated pelvic fluid collections.  No free air.  Normal alignment of the lumbar vertebrae.  IMPRESSION: Distended small bowel with decompressed ileum suggesting small bowel obstruction.  There is ileal wall thickening suggesting possible inflammatory bowel disease or stricture.  The esophagus is also distended and distal obstruction or reflux are not excluded. Additional incidental findings are stable.   Original Report Authenticated By: Burman Nieves, M.D.   Dg Abd Acute W/chest  09/29/2012   *RADIOLOGY REPORT*  Clinical Data: Abdominal pain, nausea.  ACUTE ABDOMEN SERIES (ABDOMEN 2 VIEW & CHEST 1 VIEW)  Comparison: 09/15/2012  Findings: Left Port-A-Cath remains in place, unchanged.  Heart is normal size.  Tortuosity of the thoracic aorta.  Lungs are clear. No effusions.  No acute bony abnormality.  Surgical changes in the abdomen and.  Gas within mildly distended bowel loops.  Findings are similar to prior study.  While this may  reflect ileus, I cannot exclude a distal obstruction.  No free air. No organomegaly or suspicious calcification.  No acute bony abnormality.  IMPRESSION: Moderate gaseous distention of bowel, similar to prior study.  This may reflect ileus although distal colonic obstruction cannot be excluded.  No acute cardiopulmonary disease.   Original Report Authenticated By: Charlett Nose, M.D.    Anti-infectives: Anti-infectives   None      Assessment/Plan: Impression: Bowel dysfunction, slowly improving. No need for acute surgical intervention. Plan: Continue current treatment. We'll monitor leukocytosis.  LOS: 1 day    [name] A 09/30/2012

## 2012-10-01 LAB — CBC
HCT: 35.9 % — ABNORMAL LOW (ref 39.0–52.0)
MCHC: 34.3 g/dL (ref 30.0–36.0)
MCV: 87.8 fL (ref 78.0–100.0)
RDW: 14.9 % (ref 11.5–15.5)

## 2012-10-01 LAB — BASIC METABOLIC PANEL
BUN: 6 mg/dL (ref 6–23)
Calcium: 8.4 mg/dL (ref 8.4–10.5)
Creatinine, Ser: 0.87 mg/dL (ref 0.50–1.35)
GFR calc Af Amer: >90 mL/min (ref >90)
GFR calc non Af Amer: >90 mL/min (ref >90)

## 2012-10-01 MED ORDER — METRONIDAZOLE IN NACL 5-0.79 MG/ML-% IV SOLN
500.0000 mg | Freq: Three times a day (TID) | INTRAVENOUS | Status: DC
  Administered 2012-10-01 – 2012-10-05 (×12): 500 mg via INTRAVENOUS
  Filled 2012-10-01 (×14): qty 100

## 2012-10-01 MED ORDER — CIPROFLOXACIN IN D5W 400 MG/200ML IV SOLN
400.0000 mg | Freq: Two times a day (BID) | INTRAVENOUS | Status: DC
  Administered 2012-10-01 – 2012-10-05 (×8): 400 mg via INTRAVENOUS
  Filled 2012-10-01 (×9): qty 200

## 2012-10-01 NOTE — Progress Notes (Signed)
  Subjective: Having some non-specific discomfort along the left side the abdomen. Has passed flatus, but no bowel movement yet.  Objective: Vital signs in last 24 hours: Temp:  [99.7 F (37.6 C)-100.8 F (38.2 C)] 100.3 F (37.9 C) (07/13 0412) Pulse Rate:  [75-78] 75 (07/13 0412) Resp:  [20] 20 (07/13 0412) BP: (131-146)/(77-86) 142/77 mmHg (07/13 0412) SpO2:  [92 %-94 %] 94 % (07/13 0412) Last BM Date: 09/27/12  Intake/Output from previous day: 07/12 0701 - 07/13 0700 In: 5041.7 [P.O.:480; I.V.:4561.7] Out: 2500 [Urine:950; Emesis/NG output:1550] Intake/Output this shift: Total I/O In: 200 [P.O.:200] Out: -   General appearance: alert, cooperative and no distress Resp: clear to auscultation bilaterally Cardio: regular rate and rhythm, S1, S2 normal, no murmur, click, rub or gallop GI: Soft. Minimal bowel sounds. Minimal discomfort to palpation along the left side the abdomen. No rigidity noted. Skin: Port-A-Cath site without erythema or swelling.  Lab Results:   Recent Labs  09/30/12 0511 10/01/12 0506  WBC 13.8* 17.8*  HGB 12.6* 12.3*  HCT 36.3* 35.9*  PLT 216 202   BMET  Recent Labs  09/30/12 0511 10/01/12 0506  NA 137 134*  K 4.1 4.0  CL 102 98  CO2 29 30  GLUCOSE 98 117*  BUN 4* 6  CREATININE 0.90 0.87  CALCIUM 8.5 8.4   PT/INR No results found for this basename: LABPROT, INR,  in the last 72 hours  Studies/Results: No results found.  Anti-infectives: Anti-infectives   Start     Dose/Rate Route Frequency Ordered Stop   10/01/12 1145  ciprofloxacin (CIPRO) IVPB 400 mg     400 mg 200 mL/hr over 60 Minutes Intravenous Every 12 hours 10/01/12 1143     10/01/12 1145  metroNIDAZOLE (FLAGYL) IVPB 500 mg     500 mg 100 mL/hr over 60 Minutes Intravenous Every 8 hours 10/01/12 1143        Assessment/Plan: Impression: Small bowel dysfunction, slowly improving. Leukocytosis and fever of unknown etiology. Will get urine culture, though patient  denies any dysuria. Will start ciprofloxacin and Flagyl empirically.  LOS: 2 days    Karee Christopherson A 10/01/2012

## 2012-10-02 LAB — CBC
HCT: 35.1 % — ABNORMAL LOW (ref 39.0–52.0)
MCV: 89.8 fL (ref 78.0–100.0)
Platelets: 196 10*3/uL (ref 150–400)
RBC: 3.91 MIL/uL — ABNORMAL LOW (ref 4.22–5.81)
WBC: 15.1 10*3/uL — ABNORMAL HIGH (ref 4.0–10.5)

## 2012-10-02 LAB — BASIC METABOLIC PANEL
CO2: 29 mEq/L (ref 19–32)
Chloride: 100 mEq/L (ref 96–112)
Creatinine, Ser: 0.84 mg/dL (ref 0.50–1.35)

## 2012-10-02 NOTE — Progress Notes (Signed)
  Subjective: No significant change.  No nausea.  + flatus.  Objective: Vital signs in last 24 hours: Temp:  [99 F (37.2 C)-99.7 F (37.6 C)] 99 F (37.2 C) (07/14 0410) Pulse Rate:  [71-77] 71 (07/14 0410) Resp:  [20] 20 (07/14 0410) BP: (119-125)/(65-69) 122/67 mmHg (07/14 0410) SpO2:  [93 %-95 %] 95 % (07/14 0410) Last BM Date: 09/27/12  Intake/Output from previous day: 07/13 0701 - 07/14 0700 In: 3981.7 [P.O.:840; I.V.:2441.7; IV Piggyback:700] Out: 1875 [Urine:725; Emesis/NG output:1150] Intake/Output this shift:    General appearance: alert and no distress GI: soft, non-tender; bowel sounds normal; no masses,  no organomegaly  Lab Results:   Recent Labs  10/01/12 0506 10/02/12 0508  WBC 17.8* 15.1*  HGB 12.3* 11.5*  HCT 35.9* 35.1*  PLT 202 196   BMET  Recent Labs  10/01/12 0506 10/02/12 0508  NA 134* 136  K 4.0 4.1  CL 98 100  CO2 30 29  GLUCOSE 117* 118*  BUN 6 6  CREATININE 0.87 0.84  CALCIUM 8.4 8.6   PT/INR No results found for this basename: LABPROT, INR,  in the last 72 hours ABG No results found for this basename: PHART, PCO2, PO2, HCO3,  in the last 72 hours  Studies/Results: No results found.  Anti-infectives: Anti-infectives   Start     Dose/Rate Route Frequency Ordered Stop   10/01/12 1400  ciprofloxacin (CIPRO) IVPB 400 mg     400 mg 200 mL/hr over 60 Minutes Intravenous Every 12 hours 10/01/12 1143     10/01/12 1300  metroNIDAZOLE (FLAGYL) IVPB 500 mg     500 mg 100 mL/hr over 60 Minutes Intravenous Every 8 hours 10/01/12 1143        Assessment/Plan: s/p * No surgery found * Abdominal pain.  Leukocytosis of unkown etiology.  Continue abx coverage.  Urine cx pending.  ? pain related to GU source rather than Bowel.  Clamp NG for now.    LOS: 3 days    Luay Balding C 10/02/2012

## 2012-10-02 NOTE — Care Management Note (Signed)
    Page 1 of 1   10/06/2012     8:27:51 AM   CARE MANAGEMENT NOTE 10/06/2012  Patient:  [name], [name]   Account Number:  [id number]  Date Initiated:  10/02/2012  Documentation initiated by:  Sharrie Rothman  Subjective/Objective Assessment:   Pt admitted from home with recurrent SBO. Pt lives with his wife and will return home at discharge. Pt is independent with ADL's.     Action/Plan:   No CM needs noted.   Anticipated DC Date:  10/05/2012   Anticipated DC Plan:  HOME/SELF CARE      DC Planning Services  CM consult      Choice offered to / List presented to:             Status of service:  Completed, signed off Medicare Important Message given?   (If response is "NO", the following Medicare IM given date fields will be blank) Date Medicare IM given:   Date Additional Medicare IM given:    Discharge Disposition:  HOME/SELF CARE  Per UR Regulation:    If discussed at Long Length of Stay Meetings, dates discussed:   10/03/2012    Comments:  10/06/12 0830 Arlyss Queen, RN BSN CM Pt discharged home today. No CM needs noted.  10/02/12 1120 Arlyss Queen, RN BSN CM

## 2012-10-03 ENCOUNTER — Encounter (HOSPITAL_COMMUNITY): Payer: Federal, State, Local not specified - PPO

## 2012-10-03 MED ORDER — POLYETHYLENE GLYCOL 3350 17 G PO PACK
17.0000 g | PACK | Freq: Every day | ORAL | Status: DC
  Administered 2012-10-03 – 2012-10-06 (×4): 17 g via ORAL
  Filled 2012-10-03 (×4): qty 1

## 2012-10-03 NOTE — Progress Notes (Signed)
UR chart review completed.  

## 2012-10-03 NOTE — Progress Notes (Signed)
  Subjective: States he has passed gas, but has not had a bowel movement. Has persistent left-sided abdominal pain, though it is not worsened. No nausea noted. NG tube has been clamped.  Objective: Vital signs in last 24 hours: Temp:  [99.2 F (37.3 C)-99.6 F (37.6 C)] 99.2 F (37.3 C) (07/15 0500) Pulse Rate:  [68-77] 74 (07/15 0500) Resp:  [20] 20 (07/15 0500) BP: (126-130)/(64-70) 129/68 mmHg (07/15 0500) SpO2:  [96 %-97 %] 96 % (07/15 0500) Last BM Date: 09/27/12  Intake/Output from previous day: 07/14 0701 - 07/15 0700 In: 2985 [I.V.:2285; IV Piggyback:700] Out: 2800 [Urine:2800] Intake/Output this shift:    General appearance: alert, cooperative and no distress Resp: clear to auscultation bilaterally Cardio: regular rate and rhythm, S1, S2 normal, no murmur, click, rub or gallop GI: Soft. Bowel sounds appreciated. No distention noted. Nonspecific discomfort along the left side of the abdomen to deep palpation.  Lab Results:   Recent Labs  10/01/12 0506 10/02/12 0508  WBC 17.8* 15.1*  HGB 12.3* 11.5*  HCT 35.9* 35.1*  PLT 202 196   BMET  Recent Labs  10/01/12 0506 10/02/12 0508  NA 134* 136  K 4.0 4.1  CL 98 100  CO2 30 29  GLUCOSE 117* 118*  BUN 6 6  CREATININE 0.87 0.84  CALCIUM 8.4 8.6   PT/INR No results found for this basename: LABPROT, INR,  in the last 72 hours  Studies/Results: No results found.  Anti-infectives: Anti-infectives   Start     Dose/Rate Route Frequency Ordered Stop   10/01/12 1400  ciprofloxacin (CIPRO) IVPB 400 mg     400 mg 200 mL/hr over 60 Minutes Intravenous Every 12 hours 10/01/12 1143     10/01/12 1300  metroNIDAZOLE (FLAGYL) IVPB 500 mg     500 mg 100 mL/hr over 60 Minutes Intravenous Every 8 hours 10/01/12 1143        Assessment/Plan: Impression: Small bowel dysfunction, slowly improving. Plan: Will check CBC in a.m. We'll start MiraLAX twice a day.  LOS: 4 days    Kalyani Maeda A 10/03/2012

## 2012-10-04 LAB — CBC
HCT: 34.1 % — ABNORMAL LOW (ref 39.0–52.0)
Hemoglobin: 11.4 g/dL — ABNORMAL LOW (ref 13.0–17.0)
MCV: 88.8 fL (ref 78.0–100.0)
RDW: 14.8 % (ref 11.5–15.5)
WBC: 9.5 10*3/uL (ref 4.0–10.5)

## 2012-10-04 LAB — URINE CULTURE: Colony Count: 25000

## 2012-10-04 NOTE — Progress Notes (Signed)
  Subjective: Had bowel movement yesterday. Abdominal pain almost resolved.  Objective: Vital signs in last 24 hours: Temp:  [98.8 F (37.1 C)-98.9 F (37.2 C)] 98.9 F (37.2 C) (07/16 0500) Pulse Rate:  [65-75] 65 (07/16 0500) Resp:  [20] 20 (07/16 0500) BP: (129-152)/(69-83) 152/83 mmHg (07/16 0500) SpO2:  [96 %-97 %] 96 % (07/16 0500) Last BM Date: 10/03/12  Intake/Output from previous day: 07/15 0701 - 07/16 0700 In: 1243.3 [I.V.:1243.3] Out: 1701 [Urine:1700; Stool:1] Intake/Output this shift:    General appearance: alert, cooperative and no distress Resp: clear to auscultation bilaterally Cardio: regular rate and rhythm, S1, S2 normal, no murmur, click, rub or gallop GI: Soft. Nondistended. Bowel sounds active.  Lab Results:   Recent Labs  10/02/12 0508 10/04/12 0628  WBC 15.1* 9.5  HGB 11.5* 11.4*  HCT 35.1* 34.1*  PLT 196 213   BMET  Recent Labs  10/02/12 0508  NA 136  K 4.1  CL 100  CO2 29  GLUCOSE 118*  BUN 6  CREATININE 0.84  CALCIUM 8.6   PT/INR No results found for this basename: LABPROT, INR,  in the last 72 hours  Studies/Results: No results found.  Anti-infectives: Anti-infectives   Start     Dose/Rate Route Frequency Ordered Stop   10/01/12 1400  ciprofloxacin (CIPRO) IVPB 400 mg     400 mg 200 mL/hr over 60 Minutes Intravenous Every 12 hours 10/01/12 1143     10/01/12 1300  metroNIDAZOLE (FLAGYL) IVPB 500 mg     500 mg 100 mL/hr over 60 Minutes Intravenous Every 8 hours 10/01/12 1143        Assessment/Plan: Impression: Small bowel obstruction/ileus, resolving. Leukocytosis resolved Plan: We will remove gastric tube and advanced to clear liquid diet.   LOS: 5 days    Calaya Gildner A 10/04/2012

## 2012-10-05 MED ORDER — SERTRALINE HCL 50 MG PO TABS
100.0000 mg | ORAL_TABLET | Freq: Every day | ORAL | Status: DC
  Administered 2012-10-05: 100 mg via ORAL
  Filled 2012-10-05: qty 2

## 2012-10-05 MED ORDER — HYDROCHLOROTHIAZIDE 12.5 MG PO CAPS
12.5000 mg | ORAL_CAPSULE | Freq: Every day | ORAL | Status: DC
  Administered 2012-10-05 – 2012-10-06 (×2): 12.5 mg via ORAL
  Filled 2012-10-05 (×2): qty 1

## 2012-10-05 MED ORDER — VALSARTAN-HYDROCHLOROTHIAZIDE 80-12.5 MG PO TABS: 1.0000 | ORAL_TABLET | Freq: Every morning | ORAL | Status: DC

## 2012-10-05 MED ORDER — ERYTHROMYCIN BASE 250 MG PO TABS
250.0000 mg | ORAL_TABLET | Freq: Two times a day (BID) | ORAL | Status: DC
  Administered 2012-10-05 – 2012-10-06 (×2): 250 mg via ORAL
  Filled 2012-10-05 (×2): qty 1

## 2012-10-05 MED ORDER — DOCUSATE SODIUM 100 MG PO CAPS
100.0000 mg | ORAL_CAPSULE | Freq: Two times a day (BID) | ORAL | Status: DC
  Administered 2012-10-05 – 2012-10-06 (×3): 100 mg via ORAL
  Filled 2012-10-05 (×4): qty 1

## 2012-10-05 MED ORDER — IRBESARTAN 75 MG PO TABS
75.0000 mg | ORAL_TABLET | Freq: Every day | ORAL | Status: DC
  Administered 2012-10-05 – 2012-10-06 (×2): 75 mg via ORAL
  Filled 2012-10-05 (×2): qty 1

## 2012-10-05 NOTE — Progress Notes (Signed)
  Subjective: Bowel function has returned. Would like to be advanced in his diet.  Objective: Vital signs in last 24 hours: Temp:  [98 F (36.7 C)-99 F (37.2 C)] 98 F (36.7 C) (07/17 0543) Pulse Rate:  [63-69] 69 (07/17 0543) Resp:  [18-20] 18 (07/17 0543) BP: (131-141)/(76-77) 138/77 mmHg (07/17 0543) SpO2:  [96 %-98 %] 98 % (07/17 0543) Last BM Date: 10/03/12  Intake/Output from previous day: 07/16 0701 - 07/17 0700 In: 2517.7 [I.V.:1117.7; IV Piggyback:1400] Out: 1901 [Urine:1900; Stool:1] Intake/Output this shift:    General appearance: alert, cooperative and no distress Resp: clear to auscultation bilaterally Cardio: regular rate and rhythm, S1, S2 normal, no murmur, click, rub or gallop GI: Soft, nontender, nondistended. Bowel sounds appreciated.  Lab Results:   Recent Labs  10/04/12 0628  WBC 9.5  HGB 11.4*  HCT 34.1*  PLT 213   BMET No results found for this basename: NA, K, CL, CO2, GLUCOSE, BUN, CREATININE, CALCIUM,  in the last 72 hours PT/INR No results found for this basename: LABPROT, INR,  in the last 72 hours  Studies/Results: No results found.  Anti-infectives: Anti-infectives   Start     Dose/Rate Route Frequency Ordered Stop   10/05/12 1700  erythromycin (E-MYCIN) tablet 250 mg     250 mg Oral 2 times daily with meals 10/05/12 0803     10/01/12 1400  ciprofloxacin (CIPRO) IVPB 400 mg  Status:  Discontinued     400 mg 200 mL/hr over 60 Minutes Intravenous Every 12 hours 10/01/12 1143 10/05/12 0803   10/01/12 1300  metroNIDAZOLE (FLAGYL) IVPB 500 mg  Status:  Discontinued     500 mg 100 mL/hr over 60 Minutes Intravenous Every 8 hours 10/01/12 1143 10/05/12 0803      Assessment/Plan: Impression: Bowel dysfunction improved. We'll advance to soft diet. Will restart some of his home medications. Will stop Ciprofloxacin and Flagyl.  LOS: 6 days    Larah Kuntzman A 10/05/2012

## 2012-10-06 LAB — CULTURE, BLOOD (ROUTINE X 2)

## 2012-10-06 LAB — GLUCOSE, CAPILLARY: Glucose-Capillary: 108 mg/dL — ABNORMAL HIGH (ref 70–99)

## 2012-10-06 MED ORDER — HEPARIN SOD (PORK) LOCK FLUSH 100 UNIT/ML IV SOLN
500.0000 [IU] | INTRAVENOUS | Status: AC | PRN
  Administered 2012-10-06: 500 [IU]
  Filled 2012-10-06: qty 5

## 2012-10-06 NOTE — Discharge Summary (Signed)
Physician Discharge Summary  Patient ID: DAMARCO KEYSOR MRN: [id number] DOB/AGE: 1951-01-21 62 y.o.  Admit date: 09/29/2012 Discharge date: 10/06/2012  Admission Diagnoses: Bowel dysmotility  Discharge Diagnoses: Same Active Problems:   * No active hospital problems. *   Discharged Condition: good  Hospital Course: Patient is a 62 year old white male who is well known to our service with a history of small bowel dysmotility. He was last in the hospital several weeks ago. An upper GI series showed no mechanical obstruction of the bowel. He presented on 09/29/2012 with 2 episodes of nausea and vomiting. He put his own NG tube in and was admitted to the hospital for further evaluation treatment. Once his bowel function returned, his diet was advanced at difficulty. He is being discharged home in good and improving condition.  Discharge Exam: Blood pressure 104/60, pulse 56, temperature 98.7 F (37.1 C), temperature source Oral, resp. rate 18, height 5\' 9"  (1.753 m), weight 61.871 kg (136 lb 6.4 oz), SpO2 96.00%. General appearance: alert, cooperative and no distress Resp: clear to auscultation bilaterally Cardio: regular rate and rhythm, S1, S2 normal, no murmur, click, rub or gallop GI: soft, non-tender; bowel sounds normal; no masses,  no organomegaly  Disposition: 01-Home or Self Care   Future Appointments Provider Department Dept Phone   11/14/2012 10:30 AM [facility] [phone]   11/24/2012 11:30 AM [facility] Covering Provider [facility] [phone]   12/26/2012 10:30 AM [facility] Chair 7 [facility] [phone]   02/06/2013 10:30 AM [facility] Chair 7 [facility] [phone]       Medication List         alendronate 70 MG tablet  Commonly known as:  FOSAMAX  Take 70 mg by mouth every 7 (seven) days. On sundays     atorvastatin 20 MG tablet  Commonly known as:  LIPITOR  Take 20 mg by mouth at bedtime.      cholecalciferol 1000 UNITS tablet  Commonly known as:  VITAMIN D  Take 1,000 Units by mouth every morning.     cyanocobalamin 1000 MCG/ML injection  Commonly known as:  (VITAMIN B-12)  Inject 1,000 mcg into the muscle every 30 (thirty) days.     cyclobenzaprine 10 MG tablet  Commonly known as:  FLEXERIL  Take 10 mg by mouth daily as needed for muscle spasms.     dexlansoprazole 60 MG capsule  Commonly known as:  DEXILANT  Take 60 mg by mouth every morning.     docusate sodium 100 MG capsule  Commonly known as:  COLACE  Take 100 mg by mouth 2 (two) times daily.     enoxaparin 60 MG/0.6ML injection  Commonly known as:  LOVENOX  Inject 60 mg into the skin every evening.     ENSURE PLUS Liqd  Take 237 mLs by mouth daily.     erythromycin 250 MG tablet  Commonly known as:  E-MYCIN  Take 1 tablet (250 mg total) by mouth 2 (two) times daily with a meal.     ferrous sulfate 325 (65 FE) MG tablet  Take 325 mg by mouth 2 (two) times daily.     folic acid 1 MG tablet  Commonly known as:  FOLVITE  Take 1 mg by mouth every morning.     HYDROcodone-acetaminophen 5-325 MG per tablet  Commonly known as:  NORCO  Take 1-2 tablets by mouth every 4 (four) hours as needed for pain.  LORazepam 1 MG tablet  Commonly known as:  ATIVAN  Take 1 mg by mouth every 3 (three) hours as needed. anxiety     ondansetron 4 MG tablet  Commonly known as:  ZOFRAN  Take 4 mg by mouth daily as needed for nausea.     polyethylene glycol powder powder  Commonly known as:  GLYCOLAX/MIRALAX  Take 17 g by mouth as needed. For constipation     prochlorperazine 10 MG tablet  Commonly known as:  COMPAZINE  Take 10 mg by mouth every 6 (six) hours as needed. Nausea and Vomiting     psyllium 0.52 G capsule  Commonly known as:  REGULOID  Take 2.6 g by mouth 2 (two) times daily. *5 capsules taken twice daily*     sertraline 100 MG tablet  Commonly known as:  ZOLOFT  Take 100 mg by mouth at  bedtime.     sucralfate 1 GM/10ML suspension  Commonly known as:  CARAFATE  Take 1 g by mouth 4 (four) times daily as needed (for relief).     thiamine 100 MG tablet  Take 100 mg by mouth every morning.     traMADol 50 MG tablet  Commonly known as:  ULTRAM  Take 1-2 tablets (50-100 mg total) by mouth every 4 (four) hours as needed. For pain     valsartan-hydrochlorothiazide 80-12.5 MG per tablet  Commonly known as:  DIOVAN-HCT  Take 1 tablet by mouth every morning.     Vitamin D (Ergocalciferol) 50000 UNITS Caps  Commonly known as:  DRISDOL  Take 50,000 Units by mouth once a week. Takes on Sunday           Follow-up Information   Follow up with Fabio Bering, MD. (As needed)    Contact information:   [address] [place] [address] [phone]       Signed: Franky Macho A 10/06/2012, 8:11 AM

## 2012-10-06 NOTE — Progress Notes (Signed)
UR chart review completed.  

## 2012-10-06 NOTE — Progress Notes (Signed)
Patient discharged home.  Port flushed and deaccessed - WNL.  Patient given education on SBO and smoking cessation.  No changed to meds made,  Instructed to follow up with Dr. Leticia Penna as needed.  Patient verbalizes understanding, has no questions at this time.  Patient is now eating lunch, will be driving self home when leaving.

## 2012-10-07 ENCOUNTER — Encounter (HOSPITAL_COMMUNITY): Payer: Self-pay | Admitting: *Deleted

## 2012-10-07 ENCOUNTER — Emergency Department (HOSPITAL_COMMUNITY)
Admission: EM | Admit: 2012-10-07 | Discharge: 2012-10-08 | Disposition: A | Discharge status: Home / Self Care | Payer: Federal, State, Local not specified - PPO | Attending: Emergency Medicine | Admitting: Emergency Medicine

## 2012-10-07 DIAGNOSIS — I10 Essential (primary) hypertension: Secondary | ICD-10-CM | POA: Insufficient documentation

## 2012-10-07 DIAGNOSIS — Z862 Personal history of diseases of the blood and blood-forming organs and certain disorders involving the immune mechanism: Secondary | ICD-10-CM | POA: Insufficient documentation

## 2012-10-07 DIAGNOSIS — Z9889 Other specified postprocedural states: Secondary | ICD-10-CM | POA: Insufficient documentation

## 2012-10-07 DIAGNOSIS — K219 Gastro-esophageal reflux disease without esophagitis: Secondary | ICD-10-CM | POA: Insufficient documentation

## 2012-10-07 DIAGNOSIS — G8929 Other chronic pain: Secondary | ICD-10-CM

## 2012-10-07 DIAGNOSIS — F17200 Nicotine dependence, unspecified, uncomplicated: Secondary | ICD-10-CM | POA: Insufficient documentation

## 2012-10-07 DIAGNOSIS — M129 Arthropathy, unspecified: Secondary | ICD-10-CM | POA: Insufficient documentation

## 2012-10-07 DIAGNOSIS — Z85038 Personal history of other malignant neoplasm of large intestine: Secondary | ICD-10-CM | POA: Insufficient documentation

## 2012-10-07 DIAGNOSIS — Z86711 Personal history of pulmonary embolism: Secondary | ICD-10-CM | POA: Insufficient documentation

## 2012-10-07 DIAGNOSIS — Z8619 Personal history of other infectious and parasitic diseases: Secondary | ICD-10-CM | POA: Insufficient documentation

## 2012-10-07 DIAGNOSIS — Z79899 Other long term (current) drug therapy: Secondary | ICD-10-CM | POA: Insufficient documentation

## 2012-10-07 DIAGNOSIS — D649 Anemia, unspecified: Secondary | ICD-10-CM | POA: Insufficient documentation

## 2012-10-07 DIAGNOSIS — Z8709 Personal history of other diseases of the respiratory system: Secondary | ICD-10-CM | POA: Insufficient documentation

## 2012-10-07 DIAGNOSIS — Z8673 Personal history of transient ischemic attack (TIA), and cerebral infarction without residual deficits: Secondary | ICD-10-CM | POA: Insufficient documentation

## 2012-10-07 DIAGNOSIS — F411 Generalized anxiety disorder: Secondary | ICD-10-CM | POA: Insufficient documentation

## 2012-10-07 DIAGNOSIS — Z8719 Personal history of other diseases of the digestive system: Secondary | ICD-10-CM | POA: Insufficient documentation

## 2012-10-07 DIAGNOSIS — R110 Nausea: Secondary | ICD-10-CM | POA: Insufficient documentation

## 2012-10-07 DIAGNOSIS — M810 Age-related osteoporosis without current pathological fracture: Secondary | ICD-10-CM | POA: Insufficient documentation

## 2012-10-07 DIAGNOSIS — R109 Unspecified abdominal pain: Secondary | ICD-10-CM | POA: Insufficient documentation

## 2012-10-07 DIAGNOSIS — Z8639 Personal history of other endocrine, nutritional and metabolic disease: Secondary | ICD-10-CM | POA: Insufficient documentation

## 2012-10-07 DIAGNOSIS — Z8701 Personal history of pneumonia (recurrent): Secondary | ICD-10-CM | POA: Insufficient documentation

## 2012-10-07 LAB — URINE MICROSCOPIC-ADD ON

## 2012-10-07 LAB — URINALYSIS, ROUTINE W REFLEX MICROSCOPIC
Nitrite: NEGATIVE
Specific Gravity, Urine: >1.03 — ABNORMAL HIGH (ref 1.005–1.030)
Urobilinogen, UA: 0.2 mg/dL (ref 0.0–1.0)

## 2012-10-07 MED ORDER — ONDANSETRON HCL 4 MG/2ML IJ SOLN
4.0000 mg | Freq: Once | INTRAMUSCULAR | Status: AC
  Administered 2012-10-08: 4 mg via INTRAVENOUS
  Filled 2012-10-07: qty 2

## 2012-10-07 MED ORDER — MORPHINE SULFATE 4 MG/ML IJ SOLN
4.0000 mg | Freq: Once | INTRAMUSCULAR | Status: AC
  Administered 2012-10-08: 4 mg via INTRAVENOUS
  Filled 2012-10-07: qty 1

## 2012-10-07 NOTE — ED Provider Notes (Signed)
History  This chart was scribed for Dione Booze, MD, by Candelaria Stagers, ED Scribe. This patient was seen in room [facility] and the patient's care was started at 11:52 PM  CSN: [id number] Arrival date & time 10/07/12  2022  First MD Initiated Contact with Patient 10/07/12 2347     Chief Complaint  Patient presents with  . Abdominal Pain  . Nausea   The history is provided by the patient. No language interpreter was used.   HPI Comments: [name] is a 62 y.o. male who presents to the Emergency Department complaining of continued non-radiating left mid abdominal pain that started about one week ago and became worse last night.  Pt was released from the hospital yesterday after he was admitted for a bowel obstruction. Pt describes the pain as 7/10.  Nothing seems to make the sx better or worse.  He is also experiencing nausea.  Pt denies vomiting or diarrhea.  Pt reports his last bowel movement was three days ago.  Pt has taken hydrocodone today with no relief.     PCP [name], MD  Past Medical History  Diagnosis Date  . Pulmonary embolism 02/2010  . Hypertension   . Osteoporosis   . Arthritis   . TIA (transient ischemic attack) 10/11  . ETOH abuse     quit 03/2010  . 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 Personal history of PE (pulmonary embolism) 10/01/2010  . Blood transfusion   . S/P endoscopy September 28, 2010    erosive reflux esophagitis, Billroth I anatomy  . Shortness of breath   . Sleep apnea   . Recurrent upper respiratory infection (URI)   . Anxiety   . Pneumonia   . Anemia   . Ileus   . Chronic abdominal pain   . GERD (gastroesophageal reflux disease)   . Chest pain at rest   . Erosive esophagitis   . Vitamin B12 deficiency   . Bowel obstruction 05/13/2012    Recurrent  . Hx of Clostridium difficile infection 01/2012  . 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 Hernia  repair      right inguinal  . Appendectomy  1980s  . Cholecystectomy  1980s  . Portacath placement    . Abdominal sugery      for bowel obstruction x 8, all in 1980s, except for one in 07/2010  . Esophagogastroduodenoscopy  09/28/2010  . Esophagogastroduodenoscopy  12/01/2010    Cervical web status post dilation, erosive esophagitis, B1 hemigastrectomy, inflamed anastomosis  . Colonoscopy  03/18/2011    anastomosis at 35cm. Several adenomatous polyps removed. Sigmoid diverticulosis. Next TCS 02/2013  . Esophagogastroduodenoscopy  04/16/2011    excoriation at GEJ c/w trauma/M-W tear, friable gastric anastomosis, dilation efferent limb  . Billroth 1 hemigastrectomy  1980s    per patient for benign duodenal tumor  . Esophagogastroduodenoscopy (egd) with esophageal dilation  02/25/2012    JXB:[id number] esophageal web-s/p dilation anddisruption as described above. Status post prior gastric with Billroth I configuration. Abnormal gastric mucosa at the anastomosis. Gastric biopsy showed mild chronic inflammation but no H. pylori   . Colonoscopy N/A 07/24/2012    Procedure: COLONOSCOPY;  Surgeon: Corbin Ade, MD;  Location: [facility];  Service: Endoscopy;  Laterality: N/A;  3:00  . Colon surgery  May 2012    left hemicolectomy, colon cancer found at time of surgery for bowel obstruction  .  Cardiac catheterization  07/17/2012   Family History  Problem Relation Age of Onset  . Hypertension Mother   . Arthritis Mother   . Pneumonia Mother   . Hypertension Father   . Heart attack Father    History  Substance Use Topics  . Smoking status: Current Every Day Smoker -- 0.50 packs/day for 40 years    Types: Cigarettes  . Smokeless tobacco: Never Used  . Alcohol Use: No     Comment: last drink was Apr 16 2010    Review of Systems  Gastrointestinal: Positive for nausea and abdominal pain.  All other systems reviewed and are negative.    Allergies  Review of patient's allergies indicates no  known allergies.  Home Medications   Current Outpatient Rx  Name  Route  Sig  Dispense  Refill  . atorvastatin (LIPITOR) 20 MG tablet   Oral   Take 20 mg by mouth at bedtime.          . cholecalciferol (VITAMIN D) 1000 UNITS tablet   Oral   Take 1,000 Units by mouth every morning.         [name] dexlansoprazole (DEXILANT) 60 MG capsule   Oral   Take 60 mg by mouth every morning.         . docusate sodium (COLACE) 100 MG capsule   Oral   Take 100 mg by mouth 2 (two) times daily.         [name] enoxaparin (LOVENOX) 60 MG/0.6ML injection   Subcutaneous   Inject 60 mg into the skin every evening.          [name] erythromycin (E-MYCIN) 250 MG tablet   Oral   Take 1 tablet (250 mg total) by mouth 2 (two) times daily with a meal.   60 tablet   1   . ferrous sulfate 325 (65 FE) MG tablet   Oral   Take 325 mg by mouth 2 (two) times daily.         . folic acid (FOLVITE) 1 MG tablet   Oral   Take 1 mg by mouth every morning.          [name] LORazepam (ATIVAN) 1 MG tablet   Oral   Take 1 mg by mouth every 3 (three) hours as needed. anxiety         . prochlorperazine (COMPAZINE) 10 MG tablet   Oral   Take 10 mg by mouth every 6 (six) hours as needed. Nausea and Vomiting         . psyllium (REGULOID) 0.52 G capsule   Oral   Take 2.6 g by mouth 2 (two) times daily. *5 capsules taken twice daily*         . sertraline (ZOLOFT) 100 MG tablet   Oral   Take 100 mg by mouth at bedtime.         . sucralfate (CARAFATE) 1 GM/10ML suspension   Oral   Take 1 g by mouth 4 (four) times daily as needed (for relief).          . thiamine 100 MG tablet   Oral   Take 100 mg by mouth every morning.          . valsartan-hydrochlorothiazide (DIOVAN-HCT) 80-12.5 MG per tablet   Oral   Take 1 tablet by mouth every morning.          [name] alendronate (FOSAMAX) 70 MG tablet   Oral   Take 70 mg by mouth every  7 (seven) days. On sundays         . cyanocobalamin (,VITAMIN B-12,)  1000 MCG/ML injection   Intramuscular   Inject 1,000 mcg into the muscle every 30 (thirty) days.         . cyclobenzaprine (FLEXERIL) 10 MG tablet   Oral   Take 10 mg by mouth daily as needed for muscle spasms.          . ENSURE PLUS (ENSURE PLUS) LIQD   Oral   Take 237 mLs by mouth daily.         [name] HYDROcodone-acetaminophen (NORCO) 5-325 MG per tablet   Oral   Take 1-2 tablets by mouth every 4 (four) hours as needed for pain.   35 tablet   0   . ondansetron (ZOFRAN) 4 MG tablet   Oral   Take 4 mg by mouth daily as needed for nausea.         . polyethylene glycol powder (GLYCOLAX/MIRALAX) powder   Oral   Take 17 g by mouth as needed. For constipation         . Vitamin D, Ergocalciferol, (DRISDOL) 50000 UNITS CAPS   Oral   Take 50,000 Units by mouth once a week. Takes on Sunday          BP 130/91  Pulse 93  Temp(Src) 98.2 F (36.8 C) (Oral)  Resp 18  Ht 5\' 9"  (1.753 m)  Wt 130 lb (58.968 kg)  BMI 19.19 kg/m2  SpO2 100% Physical Exam  Nursing note and vitals reviewed. Constitutional: He is oriented to person, place, and time. He appears well-developed and well-nourished. No distress.  HENT:  Head: Normocephalic and atraumatic.  Eyes: EOM are normal.  Neck: Neck supple.  Cardiovascular: Normal rate.   Pulmonary/Chest: Effort normal. No respiratory distress.  Abdominal: Bowel sounds are normal. There is no rebound and no guarding.  Midline surgical scar present, well healed.  Mild tenderness to left mid abdomen only tender to deep palpation.   Musculoskeletal: Normal range of motion.  Neurological: He is alert and oriented to person, place, and time.  Skin: Skin is warm and dry.  Psychiatric: He has a normal mood and affect. His behavior is normal.    ED Course  Procedures   DIAGNOSTIC STUDIES: Oxygen Saturation is 100% on room air, normal by my interpretation.    COORDINATION OF CARE:  11:54 PM Discussed course of care with pt which includes  zofran, morphine, and CT abdomen.  Pt understands and agrees.   Results for orders placed during the hospital encounter of 10/07/12  URINALYSIS, ROUTINE W REFLEX MICROSCOPIC      Result Value Range   Color, Urine YELLOW  YELLOW   APPearance CLOUDY (*) CLEAR   Specific Gravity, Urine >1.030 (*) 1.005 - 1.030   pH 5.5  5.0 - 8.0   Glucose, UA NEGATIVE  NEGATIVE mg/dL   Hgb urine dipstick NEGATIVE  NEGATIVE   Bilirubin Urine MODERATE (*) NEGATIVE   Ketones, ur 15 (*) NEGATIVE mg/dL   Protein, ur 161 (*) NEGATIVE mg/dL   Urobilinogen, UA 0.2  0.0 - 1.0 mg/dL   Nitrite NEGATIVE  NEGATIVE   Leukocytes, UA NEGATIVE  NEGATIVE  URINE MICROSCOPIC-ADD ON      Result Value Range   WBC, UA 7-10  <3 WBC/hpf   RBC / HPF 3-6  <3 RBC/hpf   Bacteria, UA FEW (*) RARE   Casts HYALINE CASTS (*) NEGATIVE  CBC WITH DIFFERENTIAL  Result Value Range   WBC 10.2  4.0 - 10.5 K/uL   RBC 4.70  4.22 - 5.81 MIL/uL   Hemoglobin 14.0  13.0 - 17.0 g/dL   HCT 40.9  81.1 - 91.4 %   MCV 88.1  78.0 - 100.0 fL   MCH 29.8  26.0 - 34.0 pg   MCHC 33.8  30.0 - 36.0 g/dL   RDW 78.2  95.6 - 21.3 %   Platelets 367  150 - 400 K/uL   Neutrophils Relative % 57  43 - 77 %   Lymphocytes Relative 34  12 - 46 %   Monocytes Relative 9  3 - 12 %   Eosinophils Relative 0  0 - 5 %   Basophils Relative 0  0 - 1 %   Band Neutrophils 0  0 - 10 %   Metamyelocytes Relative 0     Myelocytes 0     Promyelocytes Absolute 0     Blasts 0     nRBC 0  0 /100 WBC   Neutro Abs 5.8  1.7 - 7.7 K/uL   Lymphs Abs 3.5  0.7 - 4.0 K/uL   Monocytes Absolute 0.9  0.1 - 1.0 K/uL   Eosinophils Absolute 0.0  0.0 - 0.7 K/uL   Basophils Absolute 0.0  0.0 - 0.1 K/uL  COMPREHENSIVE METABOLIC PANEL      Result Value Range   Sodium 137  135 - 145 mEq/L   Potassium 3.9  3.5 - 5.1 mEq/L   Chloride 96  96 - 112 mEq/L   CO2 29  19 - 32 mEq/L   Glucose, Bld 87  70 - 99 mg/dL   BUN 10  6 - 23 mg/dL   Creatinine, Ser 0.86  0.50 - 1.35 mg/dL    Calcium 57.8  8.4 - 10.5 mg/dL   Total Protein 7.7  6.0 - 8.3 g/dL   Albumin 3.9  3.5 - 5.2 g/dL   AST 14  0 - 37 U/L   ALT 6  0 - 53 U/L   Alkaline Phosphatase 73  39 - 117 U/L   Total Bilirubin 0.2 (*) 0.3 - 1.2 mg/dL   GFR calc non Af Amer 68 (*) >90 mL/min   GFR calc Af Amer 78 (*) >90 mL/min   Dg Abd Acute W/chest  10/08/2012   *RADIOLOGY REPORT*  Clinical Data: Abdominal pain and nausea.  ACUTE ABDOMEN SERIES (ABDOMEN 2 VIEW & CHEST 1 VIEW)  Comparison: Chest and abdominal radiographs, and CT of the abdomen and pelvis, performed 09/29/2012  Findings: The lungs are well-aerated and clear.  There is no evidence of focal opacification, pleural effusion or pneumothorax. The cardiomediastinal silhouette is within normal limits.  There is diffuse distension of small and large bowel loops throughout the abdomen and pelvis.  On correlation with multiple prior CTs, it appears that the patient has a chronically distended colon, and that small bowel dilatation typically reflects distal ileal obstruction in this patient, though the appearance could also reflect ileus.  Postoperative changes are noted about the abdomen and pelvis.  No free intra-abdominal air is identified on the provided upright view.  No acute osseous abnormalities are seen; the sacroiliac joints are unremarkable in appearance.  A left-sided chest port is noted ending about the mid SVC.  IMPRESSION:  1.  Diffuse distension of small and large bowel loops throughout the abdomen and pelvis. On correlation with multiple prior CTs, it appears that the patient has a chronically distended  colon.  Small bowel dilatation in this patient typically reflects distal ileal obstruction, though the appearance could also reflect ileus.  No free intra-abdominal air seen. 2.  No acute cardiopulmonary process identified.   Original Report Authenticated By: Tonia Ghent, M.D.   Images viewed by me.  1. Chronic abdominal pain     MDM  Complaints of  abdominal pain with a very benign exam. Old records are reviewed and he has chronic distention of small bowel pain has been in the hospital for same. He was discharged yesterday. His surgeon, Dr. Lovell Sheehan, was in the ED seeing another patient and evaluated the patient and concurred that he had a benign abdomen. Screening labs and abdominal x-rays were unchanged from baseline and he is discharged home with prescription for Percocet.   I personally performed the services described in this documentation, which was scribed in my presence. The recorded information has been reviewed and is accurate.    Dione Booze, MD 10/08/12 [phone]

## 2012-10-07 NOTE — ED Notes (Signed)
Pt c/o llq pain and nausea x 1 day. Pt was just released yesterday from here after being treated for a bowel obstruction.

## 2012-10-08 ENCOUNTER — Emergency Department (HOSPITAL_COMMUNITY): Payer: Federal, State, Local not specified - PPO

## 2012-10-08 LAB — COMPREHENSIVE METABOLIC PANEL
AST: 14 U/L (ref 0–37)
BUN: 10 mg/dL (ref 6–23)
CO2: 29 mEq/L (ref 19–32)
Calcium: 10.1 mg/dL (ref 8.4–10.5)
Chloride: 96 mEq/L (ref 96–112)
Creatinine, Ser: 1.14 mg/dL (ref 0.50–1.35)
GFR calc Af Amer: 78 mL/min — ABNORMAL LOW (ref >90)
GFR calc non Af Amer: 68 mL/min — ABNORMAL LOW (ref >90)
Total Bilirubin: 0.2 mg/dL — ABNORMAL LOW (ref 0.3–1.2)

## 2012-10-08 LAB — CBC WITH DIFFERENTIAL/PLATELET
Basophils Absolute: 0 10*3/uL (ref 0.0–0.1)
Basophils Relative: 0 % (ref 0–1)
Eosinophils Relative: 0 % (ref 0–5)
Hemoglobin: 14 g/dL (ref 13.0–17.0)
Lymphocytes Relative: 34 % (ref 12–46)
Lymphs Abs: 3.5 10*3/uL (ref 0.7–4.0)
MCHC: 33.8 g/dL (ref 30.0–36.0)
Neutro Abs: 5.8 10*3/uL (ref 1.7–7.7)
Neutrophils Relative %: 57 % (ref 43–77)
Platelets: 367 10*3/uL (ref 150–400)
Promyelocytes Absolute: 0 %
RBC: 4.7 MIL/uL (ref 4.22–5.81)
nRBC: 0 /100 WBC

## 2012-10-08 MED ORDER — OXYCODONE-ACETAMINOPHEN 5-325 MG PO TABS: 1.0000 | ORAL_TABLET | ORAL | Status: DC | PRN

## 2012-10-08 MED ORDER — OXYCODONE-ACETAMINOPHEN 5-325 MG PO TABS
1.0000 | ORAL_TABLET | Freq: Once | ORAL | Status: AC
  Administered 2012-10-08: 1 via ORAL
  Filled 2012-10-08: qty 1

## 2012-10-08 MED ORDER — ONDANSETRON 8 MG PO TBDP
8.0000 mg | ORAL_TABLET | Freq: Once | ORAL | Status: AC
  Administered 2012-10-08: 8 mg via ORAL
  Filled 2012-10-08: qty 1

## 2012-10-08 NOTE — ED Notes (Signed)
Attempted to access power port left chest. Easy access, unfortunately returned on small amt clear liquid. Tried repositioning pt several ways. Unable to draw back any blood. Finally did get small amt pink tinged liquid but met resistance with attempt to flush. deaccessed and Dr. Preston Fleeting aware.

## 2012-10-09 ENCOUNTER — Encounter: Payer: Self-pay | Admitting: *Deleted

## 2012-10-10 ENCOUNTER — Encounter (HOSPITAL_COMMUNITY): Payer: Federal, State, Local not specified - PPO | Attending: Oncology

## 2012-10-10 DIAGNOSIS — Z9889 Other specified postprocedural states: Secondary | ICD-10-CM | POA: Insufficient documentation

## 2012-10-10 DIAGNOSIS — C186 Malignant neoplasm of descending colon: Secondary | ICD-10-CM

## 2012-10-10 DIAGNOSIS — Z95828 Presence of other vascular implants and grafts: Secondary | ICD-10-CM

## 2012-10-10 DIAGNOSIS — Z452 Encounter for adjustment and management of vascular access device: Secondary | ICD-10-CM

## 2012-10-10 LAB — URINE CULTURE
Colony Count: NO GROWTH
Culture: NO GROWTH

## 2012-10-10 MED ORDER — HEPARIN SOD (PORK) LOCK FLUSH 100 UNIT/ML IV SOLN
500.0000 [IU] | Freq: Once | INTRAVENOUS | Status: AC
  Administered 2012-10-10: 500 [IU] via INTRAVENOUS
  Filled 2012-10-10: qty 5

## 2012-10-10 MED ORDER — ALTEPLASE 2 MG IJ SOLR
2.0000 mg | Freq: Once | INTRAMUSCULAR | Status: DC
  Filled 2012-10-10: qty 2

## 2012-10-10 MED ORDER — ALTEPLASE 2 MG IJ SOLR
INTRAMUSCULAR | Status: AC
  Filled 2012-10-10: qty 2

## 2012-10-10 MED ORDER — HEPARIN SOD (PORK) LOCK FLUSH 100 UNIT/ML IV SOLN
INTRAVENOUS | Status: AC
Start: 2012-10-10 — End: 2012-10-10
  Filled 2012-10-10: qty 5

## 2012-10-10 NOTE — Progress Notes (Signed)
[name] presented for Portacath access and flush. Proper placement of portacath confirmed by CXR. Portacath located lt chest wall accessed with  H 20 needle. Good blood return present. Portacath flushed with 20ml NS and 500U/5ml Heparin and needle removed intact. Procedure without incident. Patient tolerated procedure well.   

## 2012-11-14 ENCOUNTER — Other Ambulatory Visit (HOSPITAL_COMMUNITY): Payer: Federal, State, Local not specified - PPO

## 2012-11-22 ENCOUNTER — Other Ambulatory Visit (HOSPITAL_COMMUNITY): Payer: Federal, State, Local not specified - PPO

## 2012-11-24 ENCOUNTER — Encounter (HOSPITAL_COMMUNITY): Payer: Self-pay

## 2012-11-24 ENCOUNTER — Encounter (HOSPITAL_COMMUNITY): Payer: Federal, State, Local not specified - PPO

## 2012-11-24 ENCOUNTER — Encounter (HOSPITAL_COMMUNITY): Payer: Federal, State, Local not specified - PPO | Attending: Hematology and Oncology

## 2012-11-24 VITALS — BP 119/83 | HR 101 | Temp 96.9°F | Resp 18 | Wt 137.9 lb

## 2012-11-24 DIAGNOSIS — M810 Age-related osteoporosis without current pathological fracture: Secondary | ICD-10-CM

## 2012-11-24 DIAGNOSIS — C186 Malignant neoplasm of descending colon: Secondary | ICD-10-CM

## 2012-11-24 DIAGNOSIS — C189 Malignant neoplasm of colon, unspecified: Secondary | ICD-10-CM | POA: Insufficient documentation

## 2012-11-24 DIAGNOSIS — I10 Essential (primary) hypertension: Secondary | ICD-10-CM

## 2012-11-24 DIAGNOSIS — R5381 Other malaise: Secondary | ICD-10-CM

## 2012-11-24 LAB — CBC WITH DIFFERENTIAL/PLATELET
Basophils Absolute: 0 10*3/uL (ref 0.0–0.1)
Basophils Relative: 0 % (ref 0–1)
Eosinophils Absolute: 0.1 10*3/uL (ref 0.0–0.7)
Eosinophils Relative: 1 % (ref 0–5)
HCT: 38.8 % — ABNORMAL LOW (ref 39.0–52.0)
Hemoglobin: 12.9 g/dL — ABNORMAL LOW (ref 13.0–17.0)
Lymphocytes Relative: 27 % (ref 12–46)
Lymphs Abs: 2.4 10*3/uL (ref 0.7–4.0)
MCH: 29.6 pg (ref 26.0–34.0)
MCHC: 33.2 g/dL (ref 30.0–36.0)
MCV: 89 fL (ref 78.0–100.0)
Monocytes Absolute: 0.9 10*3/uL (ref 0.1–1.0)
Monocytes Relative: 10 % (ref 3–12)
Neutro Abs: 5.4 10*3/uL (ref 1.7–7.7)
Neutrophils Relative %: 61 % (ref 43–77)
Platelets: 226 10*3/uL (ref 150–400)
RBC: 4.36 MIL/uL (ref 4.22–5.81)
RDW: 16.4 % — ABNORMAL HIGH (ref 11.5–15.5)
WBC: 8.8 10*3/uL (ref 4.0–10.5)

## 2012-11-24 LAB — COMPREHENSIVE METABOLIC PANEL
ALT: 19 U/L (ref 0–53)
AST: 22 U/L (ref 0–37)
Albumin: 3.7 g/dL (ref 3.5–5.2)
Alkaline Phosphatase: 70 U/L (ref 39–117)
BUN: 16 mg/dL (ref 6–23)
CO2: 26 mEq/L (ref 19–32)
Calcium: 8.8 mg/dL (ref 8.4–10.5)
Chloride: 101 mEq/L (ref 96–112)
Creatinine, Ser: 1.05 mg/dL (ref 0.50–1.35)
GFR calc Af Amer: 87 mL/min — ABNORMAL LOW (ref >90)
GFR calc non Af Amer: 75 mL/min — ABNORMAL LOW (ref >90)
Glucose, Bld: 99 mg/dL (ref 70–99)
Potassium: 3.7 mEq/L (ref 3.5–5.1)
Sodium: 137 mEq/L (ref 135–145)
Total Bilirubin: <0.1 mg/dL — ABNORMAL LOW (ref 0.3–1.2)
Total Protein: 6.8 g/dL (ref 6.0–8.3)

## 2012-11-24 LAB — LACTATE DEHYDROGENASE: LDH: 171 U/L (ref 94–250)

## 2012-11-24 MED ORDER — HEPARIN SOD (PORK) LOCK FLUSH 100 UNIT/ML IV SOLN
INTRAVENOUS | Status: AC
  Filled 2012-11-24: qty 5

## 2012-11-24 MED ORDER — SODIUM CHLORIDE 0.9 % IJ SOLN
10.0000 mL | INTRAMUSCULAR | Status: DC | PRN
  Administered 2012-11-24: 10 mL via INTRAVENOUS
  Filled 2012-11-24: qty 10

## 2012-11-24 MED ORDER — HEPARIN SOD (PORK) LOCK FLUSH 100 UNIT/ML IV SOLN
500.0000 [IU] | Freq: Once | INTRAVENOUS | Status: AC
  Administered 2012-11-24: 500 [IU] via INTRAVENOUS
  Filled 2012-11-24: qty 5

## 2012-11-24 NOTE — Progress Notes (Signed)
[name] presented for Portacath access and flush. Proper placement of portacath confirmed by CXR. Portacath located lt chest wall accessed with  H 20 needle. Good blood return present and labs drawn. Portacath flushed with 20ml NS and 500U/56ml Heparin and needle removed intact. Procedure without incident. Patient tolerated procedure well.

## 2012-11-24 NOTE — Progress Notes (Signed)
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    INTERVAL HISTORY:   STAFFORD RIVIERA 62 y.o. 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  Pathology showed;  1. Colon, segmental resection for tumor, left colon - COLONIC ADENOCARCINOMA WITH MUCINOUS FEATURES EXTENDING INTO PERICOLONIC CONNECTIVE TISSUE. - MARGINS NOT INVOLVED. - METASTATIC CARCINOMA IN ONE OF EITHTEEN LYMPH NODES (1/18). - SEE MICROSCOPIC DESCRIPTION. 2. Soft tissue mass, simple excision, mesenteric - FINDINGS CONSISTENT WITH PARTIALLY FIBROTIC APPENDICES EPIPLOICA. NO EVIDENCE OF MALIGNANCY. Patient began adjuvant chemotherapy and had a total of 7/12 cycles of FOLFOX  form 10/12/2010 - 01/04/2011. This was stopped due to toxicity problems. 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 He has been  followed since then without evidence of disease.  He is here today for his scheduled follow up tells me that he felt fatigued over the last 4 weeks.  He denies depression.  He is a retired Air[employer] pilot.  He states that he noticed the streaks of blood a few days ago which has not recurred.  He has black stool which he blames on her iron pills.  He denies dysphagia.  He thinks he may have had increased sweating over the last that one week. His weight is stable.  Dr. Kendell Bane did a colonoscopy on him on 07/24/2012 which showed normal colonic anastomosis and the an adjacent polyp that was removed.  A rectal polyp was also removed. Pathology report of these were as follows; 1. Colon, polyp(s), proximal to colon anastomosis - TUBULAR ADENOMA (1 FRAGMENT). NO HIGH GRADE DYSPLASIA OR MALIGNANCY IDENTIFIED. 2. Rectum, polyp(s) - PROLAPSED TYPE POLYP. - NO  EVIDENCE OF ADENOMATOUS CHANGES OR MALIGNANCY.     MEDICAL HISTORY: Past Medical History  Diagnosis Date  . Pulmonary embolism 02/2010  . Hypertension   . Osteoporosis   . Arthritis   . TIA (transient ischemic attack) 10/11  . ETOH abuse     quit 03/2010  . 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 Personal history of PE (pulmonary embolism) 10/01/2010  . Blood transfusion   . S/P endoscopy September 28, 2010    erosive reflux esophagitis, Billroth I anatomy  . Shortness of breath   . Sleep apnea   . Recurrent upper respiratory infection (URI)   . Anxiety   . Pneumonia   . Anemia   . Ileus   . Chronic abdominal pain   . GERD (gastroesophageal reflux disease)   . Chest pain at rest   . Erosive esophagitis   . Vitamin B12 deficiency   . Bowel obstruction 05/13/2012    Recurrent  . Hx of Clostridium difficile infection 01/2012  . 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    Stage 3    ALLERGIES:  has No Known Allergies.   SURGICAL HISTORY:  Past Surgical History  Procedure Laterality Date  . Hernia repair      right inguinal  . Appendectomy  1980s  . Cholecystectomy  1980s  . Portacath placement    . Abdominal sugery      for bowel obstruction x 8, all in 1980s, except for one in 07/2010  . Esophagogastroduodenoscopy  09/28/2010  . Esophagogastroduodenoscopy  12/01/2010    Cervical web status post  dilation, erosive esophagitis, B1 hemigastrectomy, inflamed anastomosis  . Colonoscopy  03/18/2011    anastomosis at 35cm. Several adenomatous polyps removed. Sigmoid diverticulosis. Next TCS 02/2013  . Esophagogastroduodenoscopy  04/16/2011    excoriation at GEJ c/w trauma/M-W tear, friable gastric anastomosis, dilation efferent limb  . Billroth 1 hemigastrectomy  1980s    per patient for benign duodenal tumor  . Esophagogastroduodenoscopy (egd) with esophageal dilation  02/25/2012    [id number] esophageal web-s/p dilation anddisruption as described above. Status post prior  gastric with Billroth I configuration. Abnormal gastric mucosa at the anastomosis. Gastric biopsy showed mild chronic inflammation but no H. pylori   . Colonoscopy N/A 07/24/2012    Procedure: COLONOSCOPY;  Surgeon: Corbin Ade, MD;  Location: [facility];  Service: Endoscopy;  Laterality: N/A;  3:00  . Colon surgery  May 2012    left hemicolectomy, colon cancer found at time of surgery for bowel obstruction  . Cardiac catheterization  07/17/2012     REVIEW OF SYSTEMS: 14 point review of system is as in the history above otherwise negative.   PHYSICAL EXAMINATION:  Blood pressure 119/83, pulse 101, temperature 96.9 F (36.1 C), temperature source Oral, resp. rate 18, weight 137 lb 14.4 oz (62.551 kg). GENERAL: No acute distress. SKIN:  No rashes or significant lesions . No ecchymosis or petechial rash. HEAD: Normocephalic, No masses, lesions, tenderness or abnormalities  EYES: Conjunctiva are pink and non-injected and no jaundice ENT: External ears normal ,lips, buccal mucosa, and tongue normal and mucous membranes are moist . No evidence of thrush. Denture in place. LYMPH: No palpable lymphadenopathy, in the neck, supraclavicular areas or axilla. LUNGS: Clear to auscultation , no crackles or wheezes HEART: regular rate & rhythm, no murmurs, no gallops, S1 normal and S2 normal and no S3. ABDOMEN: Abdomen soft, non-tender, no masses or organomegaly and no hepatosplenomegaly palpable. Old midline surgical scar. EXTREMITIES: No edema, no skin discoloration or tenderness NEURO: Alert & oriented .    LABORATORY DATA: Lab Results  Component Value Date   WBC 10.2 10/07/2012   HGB 14.0 10/07/2012   HCT 41.4 10/07/2012   MCV 88.1 10/07/2012   PLT 367 10/07/2012      Chemistry      Component Value Date/Time   NA 137 10/07/2012 2218   K 3.9 10/07/2012 2218   CL 96 10/07/2012 2218   CO2 29 10/07/2012 2218   BUN 10 10/07/2012 2218   CREATININE 1.14 10/07/2012 2218      Component Value  Date/Time   CALCIUM 10.1 10/07/2012 2218   ALKPHOS 73 10/07/2012 2218   AST 14 10/07/2012 2218   ALT 6 10/07/2012 2218   BILITOT 0.2* 10/07/2012 2218       RADIOGRAPHIC STUDIES: No results found.   ASSESSMENT:  Stage III colon cancer status post surgical resection and 7 cycles of Folfox (stopped early due to toxicity.He has no evidence of disease at this time.He still has residual grade 1 peripheral neuropathy in the feet. His fatigue is mostly likely from inactivity. However I will get blood work today to rule out anemia. Hb in 09/2012 was normal. He is also up-to-date with colonoscopy which was done on 07/24/2012 and did not reveal any evidence of malignancy.   PLAN:  1. CBC, CMP and iron studies.   2. I counseled him to increase physical activity.   3. He will return to clinic in 3 months, will do a CEA,CBC and CMP then. 4. Repeat CT of the chest  abdomen and pelvis in 09/2013.    All questions were satisfactorily answered. Patient knows to call if  any concern arises.  I spent more than 50 % counseling the patient face to face. The total time spent in the appointment was 30 minutes.   Sherral Hammers, MD FACP. Hematology/Oncology.

## 2012-11-24 NOTE — Patient Instructions (Signed)
.  [facility] Discharge Instructions  RECOMMENDATIONS MADE BY THE CONSULTANT AND ANY TEST RESULTS WILL BE SENT TO YOUR REFERRING PHYSICIAN.  EXAM FINDINGS BY THE PHYSICIAN TODAY AND SIGNS OR SYMPTOMS TO REPORT TO CLINIC OR PRIMARY PHYSICIAN: Exam per Dr. Lanny Cramp today.   INSTRUCTIONS GIVEN AND DISCUSSED: There is no evidence of cancer  At this time Try to increase your physical activity Scans will be yearly for 5 years. Continue port flush every 6 weeks  SPECIAL INSTRUCTIONS/FOLLOW-UP: 3 months  Thank you for choosing [facility] to provide your oncology and hematology care.  To afford each patient quality time with our providers, please arrive at least 15 minutes before your scheduled appointment time.  With your help, our goal is to use those 15 minutes to complete the necessary work-up to ensure our physicians have the information they need to help with your evaluation and healthcare recommendations.    Effective January 1st, 2014, we ask that you re-schedule your appointment with our physicians should you arrive 10 or more minutes late for your appointment.  We strive to give you quality time with our providers, and arriving late affects you and other patients whose appointments are after yours.    Again, thank you for choosing [facility].  Our hope is that these requests will decrease the amount of time that you wait before being seen by our physicians.       _____________________________________________________________  Should you have questions after your visit to [facility], please contact our office at [phone] between the hours of 8:30 a.m. and 5:00 p.m.  Voicemails left after 4:30 p.m. will not be returned until the following business day.  For prescription refill requests, have your pharmacy contact our office with your prescription refill request.

## 2012-11-25 LAB — CEA: CEA: 2 ng/mL (ref 0.0–5.0)

## 2012-12-26 ENCOUNTER — Encounter (HOSPITAL_COMMUNITY): Payer: Federal, State, Local not specified - PPO

## 2012-12-28 IMAGING — CT CT ABD-PELV W/ CM
2 of 6 series · 16 of 46 positions shown, 18 images · IV contrast (Omnipaque 300)
Comparison: 02/24/2010

CLINICAL DATA: Abdominal pain.  Recent small bowel obstruction.
Nausea and vomiting.

CT ABDOMEN AND PELVIS WITH CONTRAST
TECHNIQUE: Multidetector CT imaging of the abdomen and pelvis was
performed following the standard protocol during bolus
administration of intravenous contrast.
Contrast: 100 ml of Mmnipaque-T22

[Series 2: abd_pel_with 5.0 b40f · axial · 0.59mm/px · z∈[-384,+1]mm · 13 of 89 slices shown, 15 images]
[im 6/89  soft-tissue]
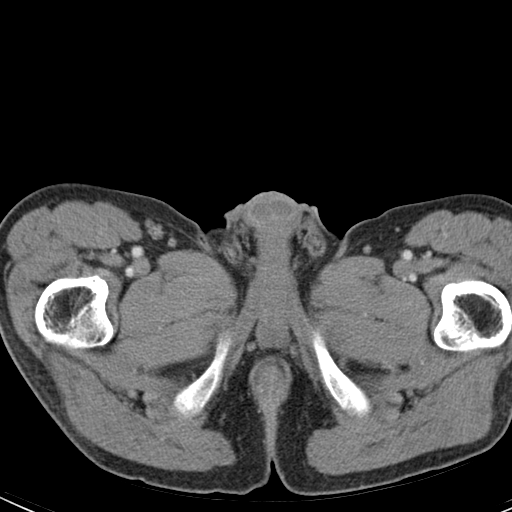
[im 6/89  bone]
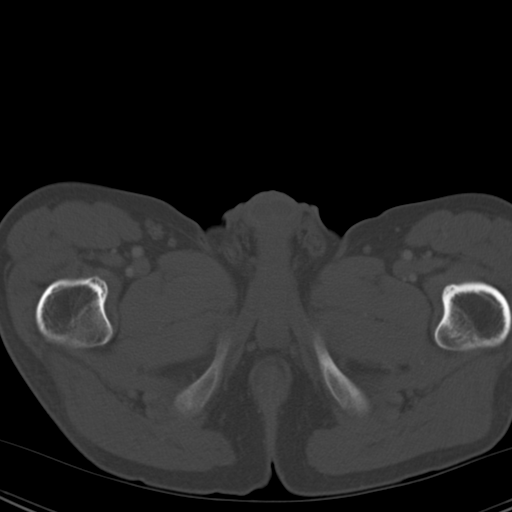
[im 11/89  soft-tissue]
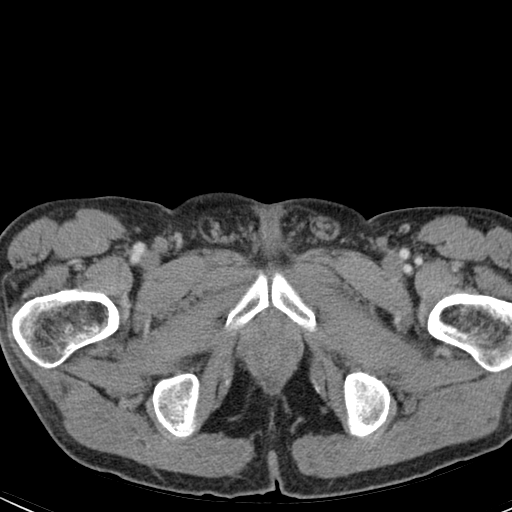
[im 21/89  soft-tissue]
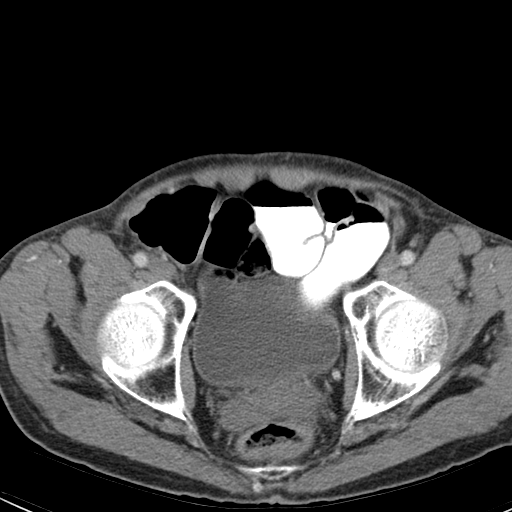
[im 26/89  soft-tissue]
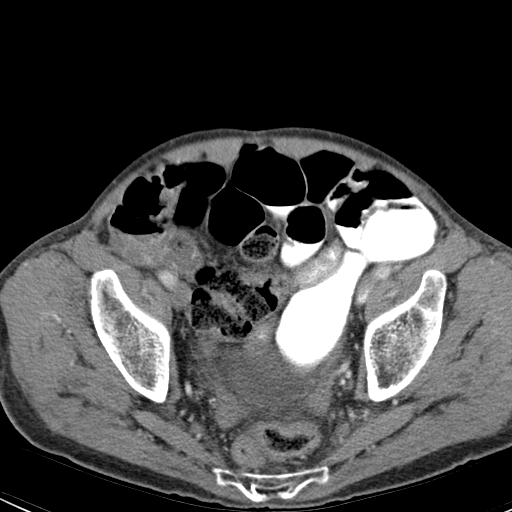
[im 32/89  soft-tissue]
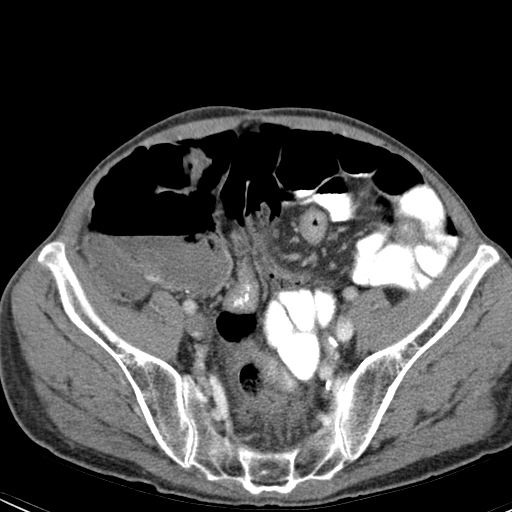
[im 37/89  soft-tissue]
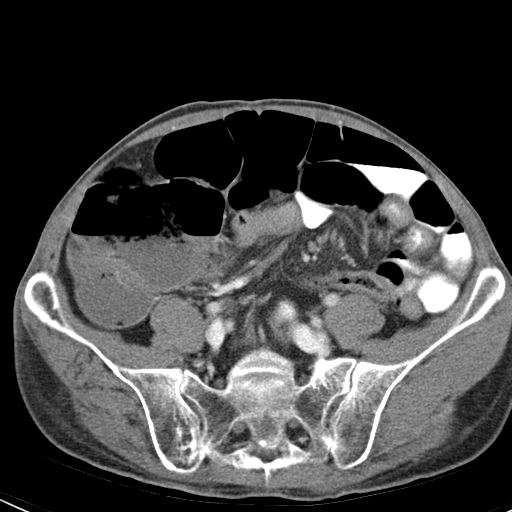
[im 47/89  soft-tissue]
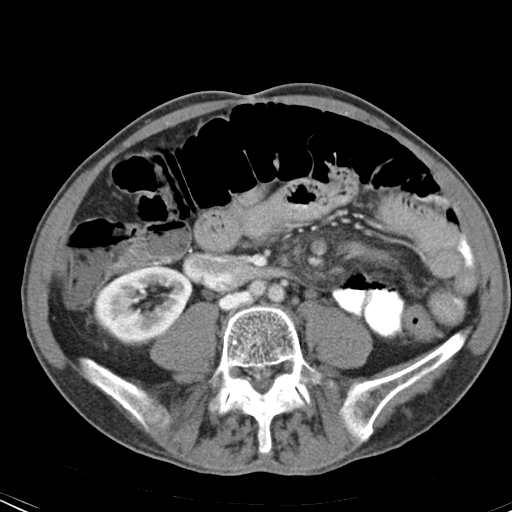
[im 52/89  soft-tissue]
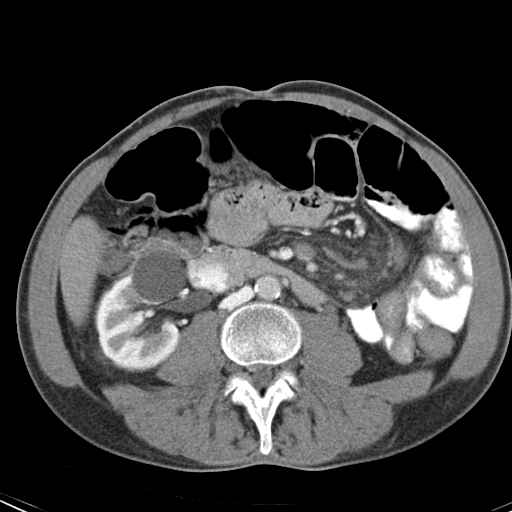
[im 57/89  soft-tissue]
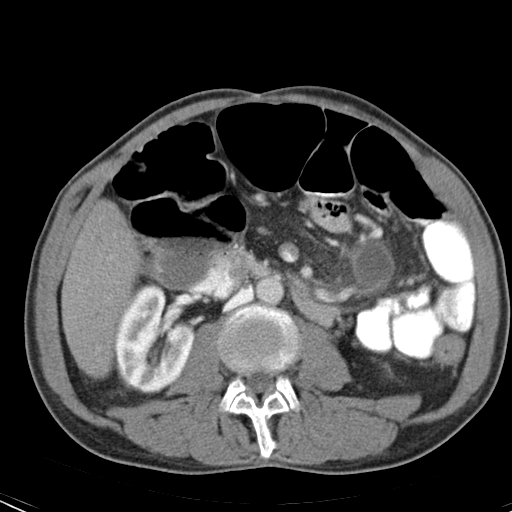
[im 57/89  bone]
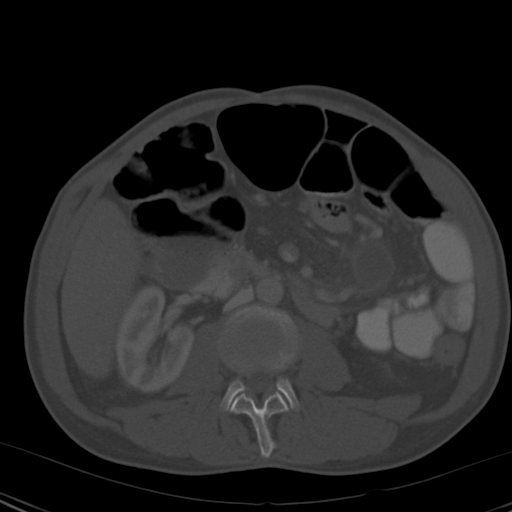
[im 63/89  soft-tissue]
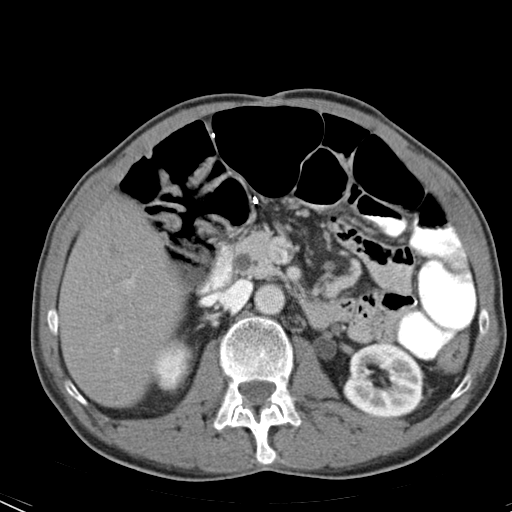
[im 68/89  soft-tissue]
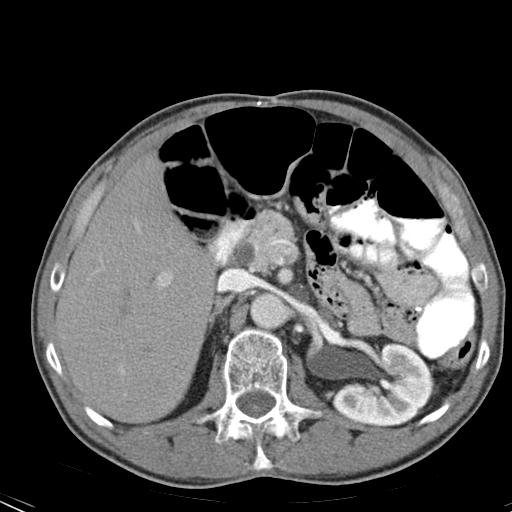
[im 78/89  soft-tissue]
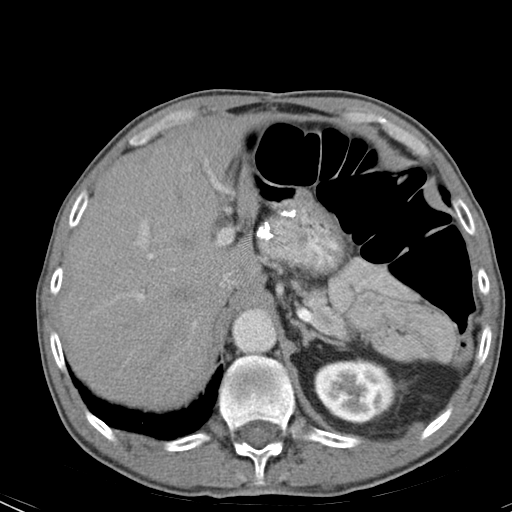
[im 83/89  soft-tissue]
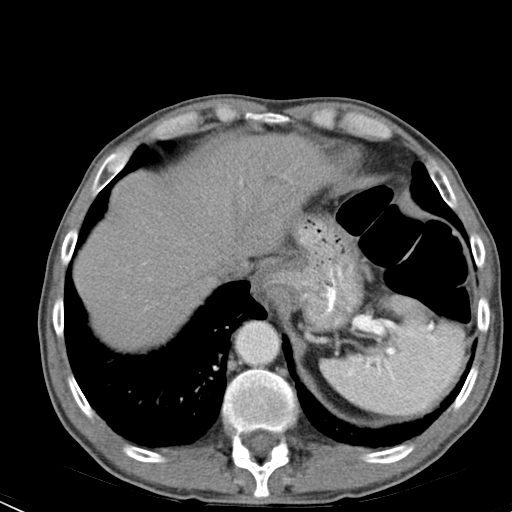

[Series 4: abd_pel_with 3.0 spo · coronal · 0.59mm/px · 3 of 74 slices shown]
[im 25/74  soft-tissue]
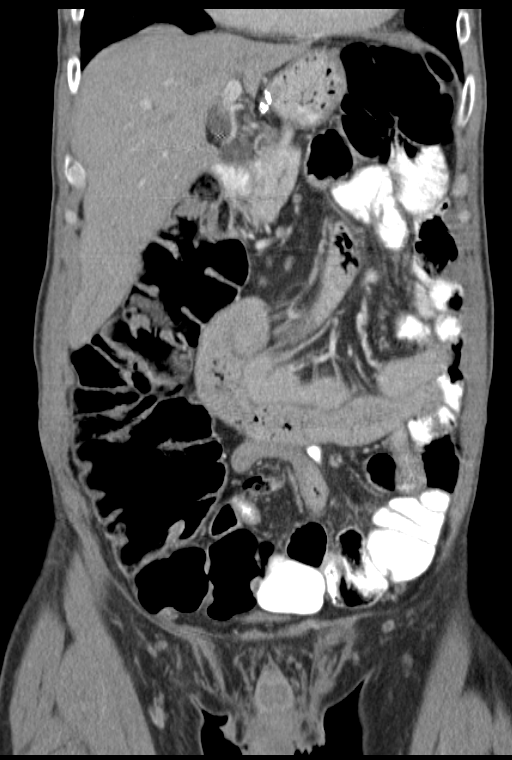
[im 33/74  soft-tissue]
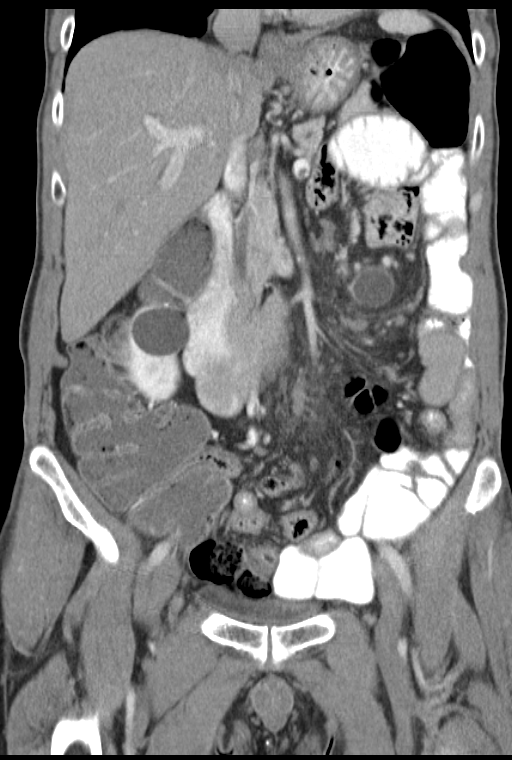
[im 41/74  soft-tissue]
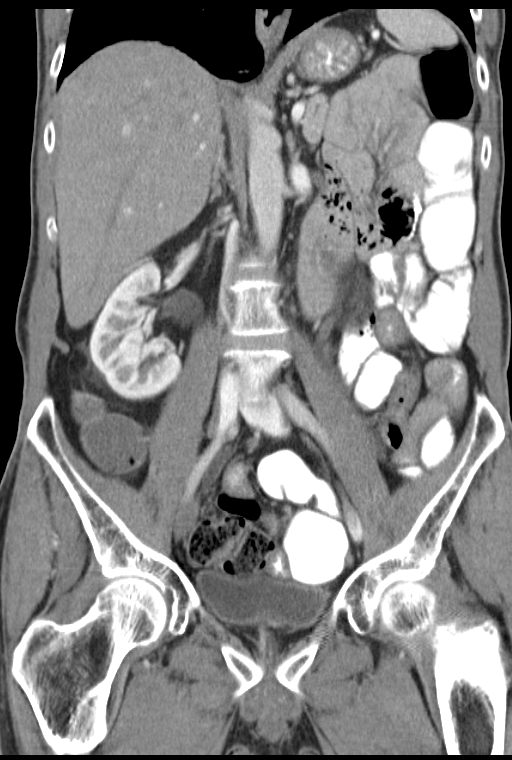

[16 of 46 positions shown; findings below may reference images not displayed]

FINDINGS: There is no significant small bowel distention.  The
cecum is slightly distended to a diameter of approximately 10 cm
but there is no discrete colonic obstruction.  The patient has had
previous gastric surgery.  There is a 2.6 cm well demarcated fluid
collection in the mesentery in the left mid abdomen.  This could
represent an abscess or pseudocyst.  The wall slightly enhances.
There is no free fluid in the abdomen.

The liver, spleen, pancreas, and adrenal glands are normal.  There
are two benign-appearing cysts on the right kidney which are
unchanged since 02/24/2010.  The common bile duct is chronically
dilated at 8 mm in diameter.  Gallbladder has been removed.

No acute osseous abnormality.
IMPRESSION: 1.  2.6 cm fluid collection in the mesentery in the left mid
abdomen, possibly a mesenteric  abscess.  This was not present on
the prior CT scan dated 02/24/2010.
[DATE]. Slightly prominent cyst but there is no discrete colonic
obstruction.
3.  No discrete small bowel obstruction.

## 2013-01-25 ENCOUNTER — Other Ambulatory Visit: Payer: Self-pay

## 2013-02-06 ENCOUNTER — Encounter (HOSPITAL_COMMUNITY): Payer: Federal, State, Local not specified - PPO | Attending: Hematology and Oncology

## 2013-02-06 DIAGNOSIS — C189 Malignant neoplasm of colon, unspecified: Secondary | ICD-10-CM | POA: Insufficient documentation

## 2013-02-06 DIAGNOSIS — K922 Gastrointestinal hemorrhage, unspecified: Secondary | ICD-10-CM | POA: Insufficient documentation

## 2013-02-06 DIAGNOSIS — K625 Hemorrhage of anus and rectum: Secondary | ICD-10-CM | POA: Insufficient documentation

## 2013-02-06 DIAGNOSIS — C186 Malignant neoplasm of descending colon: Secondary | ICD-10-CM

## 2013-02-06 DIAGNOSIS — Z452 Encounter for adjustment and management of vascular access device: Secondary | ICD-10-CM

## 2013-02-06 DIAGNOSIS — D638 Anemia in other chronic diseases classified elsewhere: Secondary | ICD-10-CM | POA: Insufficient documentation

## 2013-02-06 DIAGNOSIS — K920 Hematemesis: Secondary | ICD-10-CM | POA: Insufficient documentation

## 2013-02-06 DIAGNOSIS — K208 Other esophagitis without bleeding: Secondary | ICD-10-CM | POA: Insufficient documentation

## 2013-02-06 DIAGNOSIS — K5289 Other specified noninfective gastroenteritis and colitis: Secondary | ICD-10-CM | POA: Insufficient documentation

## 2013-02-06 LAB — CBC WITH DIFFERENTIAL/PLATELET
Basophils Absolute: 0 K/uL (ref 0.0–0.1)
Basophils Relative: 0 % (ref 0–1)
Eosinophils Absolute: 0.2 K/uL (ref 0.0–0.7)
Eosinophils Relative: 3 % (ref 0–5)
HCT: 40 % (ref 39.0–52.0)
Hemoglobin: 13.2 g/dL (ref 13.0–17.0)
Lymphocytes Relative: 28 % (ref 12–46)
Lymphs Abs: 2.2 K/uL (ref 0.7–4.0)
MCH: 29.7 pg (ref 26.0–34.0)
MCHC: 33 g/dL (ref 30.0–36.0)
MCV: 89.9 fL (ref 78.0–100.0)
Monocytes Absolute: 0.8 K/uL (ref 0.1–1.0)
Monocytes Relative: 10 % (ref 3–12)
Neutro Abs: 4.7 K/uL (ref 1.7–7.7)
Neutrophils Relative %: 59 % (ref 43–77)
Platelets: 247 K/uL (ref 150–400)
RBC: 4.45 MIL/uL (ref 4.22–5.81)
RDW: 16.6 % — ABNORMAL HIGH (ref 11.5–15.5)
WBC: 8 K/uL (ref 4.0–10.5)

## 2013-02-06 LAB — COMPREHENSIVE METABOLIC PANEL
AST: 17 U/L (ref 0–37)
Albumin: 3.3 g/dL — ABNORMAL LOW (ref 3.5–5.2)
Alkaline Phosphatase: 80 U/L (ref 39–117)
Chloride: 101 mEq/L (ref 96–112)
Potassium: 4.2 mEq/L (ref 3.5–5.1)
Total Bilirubin: <0.1 mg/dL — ABNORMAL LOW (ref 0.3–1.2)

## 2013-02-06 LAB — IRON AND TIBC
Iron: 28 ug/dL — ABNORMAL LOW (ref 42–135)
Saturation Ratios: 8 % — ABNORMAL LOW (ref 20–55)
UIBC: 343 ug/dL (ref 125–400)

## 2013-02-06 LAB — FERRITIN: Ferritin: 25 ng/mL (ref 22–322)

## 2013-02-06 MED ORDER — HEPARIN SOD (PORK) LOCK FLUSH 100 UNIT/ML IV SOLN
500.0000 [IU] | Freq: Once | INTRAVENOUS | Status: AC
  Administered 2013-02-06: 500 [IU] via INTRAVENOUS

## 2013-02-06 MED ORDER — HEPARIN SOD (PORK) LOCK FLUSH 100 UNIT/ML IV SOLN
INTRAVENOUS | Status: AC
  Filled 2013-02-06: qty 5

## 2013-02-06 MED ORDER — SODIUM CHLORIDE 0.9 % IJ SOLN
10.0000 mL | INTRAMUSCULAR | Status: DC | PRN
  Administered 2013-02-06: 10 mL via INTRAVENOUS

## 2013-02-06 NOTE — Progress Notes (Signed)
Brad B Careaga presented for Portacath access and flush.  Proper placement of portacath confirmed by CXR.  Portacath located left chest wall accessed with  H 20 needle.  Good blood return present. Portacath flushed with 20ml NS and 500U/5ml Heparin and needle removed intact.  Procedure tolerated well and without incident.    

## 2013-02-07 ENCOUNTER — Other Ambulatory Visit (HOSPITAL_COMMUNITY): Payer: Self-pay | Admitting: Oncology

## 2013-02-07 DIAGNOSIS — K625 Hemorrhage of anus and rectum: Secondary | ICD-10-CM

## 2013-02-07 DIAGNOSIS — K920 Hematemesis: Secondary | ICD-10-CM

## 2013-02-07 DIAGNOSIS — K922 Gastrointestinal hemorrhage, unspecified: Secondary | ICD-10-CM

## 2013-02-07 DIAGNOSIS — K2210 Ulcer of esophagus without bleeding: Secondary | ICD-10-CM

## 2013-02-07 DIAGNOSIS — D638 Anemia in other chronic diseases classified elsewhere: Secondary | ICD-10-CM

## 2013-02-07 DIAGNOSIS — K529 Noninfective gastroenteritis and colitis, unspecified: Secondary | ICD-10-CM

## 2013-02-08 ENCOUNTER — Encounter (HOSPITAL_BASED_OUTPATIENT_CLINIC_OR_DEPARTMENT_OTHER): Payer: Federal, State, Local not specified - PPO

## 2013-02-08 ENCOUNTER — Telehealth (HOSPITAL_COMMUNITY): Payer: Self-pay | Admitting: Oncology

## 2013-02-08 VITALS — BP 120/79 | HR 99 | Temp 97.9°F | Resp 20

## 2013-02-08 DIAGNOSIS — D638 Anemia in other chronic diseases classified elsewhere: Secondary | ICD-10-CM

## 2013-02-08 DIAGNOSIS — K920 Hematemesis: Secondary | ICD-10-CM

## 2013-02-08 DIAGNOSIS — K2210 Ulcer of esophagus without bleeding: Secondary | ICD-10-CM

## 2013-02-08 DIAGNOSIS — K922 Gastrointestinal hemorrhage, unspecified: Secondary | ICD-10-CM

## 2013-02-08 DIAGNOSIS — K625 Hemorrhage of anus and rectum: Secondary | ICD-10-CM

## 2013-02-08 DIAGNOSIS — K529 Noninfective gastroenteritis and colitis, unspecified: Secondary | ICD-10-CM

## 2013-02-08 MED ORDER — SODIUM CHLORIDE 0.9 % IV SOLN
INTRAVENOUS | Status: DC
Start: 2013-02-08 — End: 2013-02-08
  Administered 2013-02-08: 15:00:00 via INTRAVENOUS

## 2013-02-08 MED ORDER — HEPARIN SOD (PORK) LOCK FLUSH 100 UNIT/ML IV SOLN
500.0000 [IU] | Freq: Once | INTRAVENOUS | Status: AC
  Administered 2013-02-08: 500 [IU] via INTRAVENOUS

## 2013-02-08 MED ORDER — SODIUM CHLORIDE 0.9 % IV SOLN
1020.0000 mg | Freq: Once | INTRAVENOUS | Status: AC
  Administered 2013-02-08: 1020 mg via INTRAVENOUS
  Filled 2013-02-08: qty 34

## 2013-02-08 MED ORDER — SODIUM CHLORIDE 0.9 % IJ SOLN
10.0000 mL | INTRAMUSCULAR | Status: DC | PRN
  Administered 2013-02-08: 10 mL via INTRAVENOUS

## 2013-02-08 NOTE — Progress Notes (Signed)
Tolerated ferraheme infusion well.

## 2013-02-08 NOTE — Telephone Encounter (Signed)
PC to [facility] spk with [facility]. ?'d if [id number] feraheme requires auth. auth not required ZOX#[id number]

## 2013-03-08 ENCOUNTER — Encounter (HOSPITAL_COMMUNITY): Payer: Federal, State, Local not specified - PPO | Attending: Hematology and Oncology

## 2013-03-08 ENCOUNTER — Encounter (HOSPITAL_COMMUNITY): Payer: Self-pay

## 2013-03-08 ENCOUNTER — Ambulatory Visit (HOSPITAL_COMMUNITY)
Admission: RE | Admit: 2013-03-08 | Discharge: 2013-03-08 | Disposition: A | Discharge status: Home / Self Care | Payer: Federal, State, Local not specified - PPO | Source: Ambulatory Visit | Attending: Hematology and Oncology | Admitting: Hematology and Oncology

## 2013-03-08 VITALS — BP 125/88 | HR 107 | Temp 97.8°F | Resp 20 | Wt 142.7 lb

## 2013-03-08 DIAGNOSIS — R0789 Other chest pain: Secondary | ICD-10-CM | POA: Insufficient documentation

## 2013-03-08 DIAGNOSIS — I2699 Other pulmonary embolism without acute cor pulmonale: Secondary | ICD-10-CM

## 2013-03-08 DIAGNOSIS — C189 Malignant neoplasm of colon, unspecified: Secondary | ICD-10-CM | POA: Insufficient documentation

## 2013-03-08 DIAGNOSIS — I10 Essential (primary) hypertension: Secondary | ICD-10-CM | POA: Insufficient documentation

## 2013-03-08 DIAGNOSIS — Z85038 Personal history of other malignant neoplasm of large intestine: Secondary | ICD-10-CM

## 2013-03-08 DIAGNOSIS — K56609 Unspecified intestinal obstruction, unspecified as to partial versus complete obstruction: Secondary | ICD-10-CM

## 2013-03-08 DIAGNOSIS — Z86711 Personal history of pulmonary embolism: Secondary | ICD-10-CM

## 2013-03-08 DIAGNOSIS — R0609 Other forms of dyspnea: Secondary | ICD-10-CM | POA: Insufficient documentation

## 2013-03-08 DIAGNOSIS — R0989 Other specified symptoms and signs involving the circulatory and respiratory systems: Secondary | ICD-10-CM | POA: Insufficient documentation

## 2013-03-08 DIAGNOSIS — Z87891 Personal history of nicotine dependence: Secondary | ICD-10-CM | POA: Insufficient documentation

## 2013-03-08 LAB — COMPREHENSIVE METABOLIC PANEL
ALT: 13 U/L (ref 0–53)
Albumin: 3.5 g/dL (ref 3.5–5.2)
Alkaline Phosphatase: 80 U/L (ref 39–117)
BUN: 8 mg/dL (ref 6–23)
CO2: 26 mEq/L (ref 19–32)
Chloride: 100 mEq/L (ref 96–112)
GFR calc Af Amer: >90 mL/min (ref >90)
GFR calc non Af Amer: >90 mL/min (ref >90)
Glucose, Bld: 83 mg/dL (ref 70–99)
Potassium: 3.9 mEq/L (ref 3.5–5.1)
Sodium: 137 mEq/L (ref 135–145)
Total Bilirubin: 0.2 mg/dL — ABNORMAL LOW (ref 0.3–1.2)
Total Protein: 6.9 g/dL (ref 6.0–8.3)

## 2013-03-08 LAB — CBC WITH DIFFERENTIAL/PLATELET
Basophils Absolute: 0 10*3/uL (ref 0.0–0.1)
Eosinophils Absolute: 0.1 10*3/uL (ref 0.0–0.7)
Hemoglobin: 14.5 g/dL (ref 13.0–17.0)
Lymphocytes Relative: 29 % (ref 12–46)
Lymphs Abs: 2 10*3/uL (ref 0.7–4.0)
MCH: 30.4 pg (ref 26.0–34.0)
Monocytes Absolute: 0.7 10*3/uL (ref 0.1–1.0)
Monocytes Relative: 10 % (ref 3–12)
Neutro Abs: 4.1 10*3/uL (ref 1.7–7.7)
Neutrophils Relative %: 59 % (ref 43–77)
Platelets: 188 10*3/uL (ref 150–400)
RBC: 4.77 MIL/uL (ref 4.22–5.81)
WBC: 6.8 10*3/uL (ref 4.0–10.5)

## 2013-03-08 LAB — D-DIMER, QUANTITATIVE: D-Dimer, Quant: 0.28 ug/mL-FEU (ref 0.00–0.48)

## 2013-03-08 MED ORDER — SODIUM CHLORIDE 0.9 % IJ SOLN
10.0000 mL | Freq: Once | INTRAMUSCULAR | Status: AC
  Administered 2013-03-08: 10 mL via INTRAVENOUS

## 2013-03-08 MED ORDER — HEPARIN SOD (PORK) LOCK FLUSH 100 UNIT/ML IV SOLN
500.0000 [IU] | Freq: Once | INTRAVENOUS | Status: AC
  Administered 2013-03-08: 500 [IU] via INTRAVENOUS

## 2013-03-08 MED ORDER — METOCLOPRAMIDE HCL 5 MG PO TABS: ORAL_TABLET | ORAL | Status: DC

## 2013-03-08 MED ORDER — HEPARIN SOD (PORK) LOCK FLUSH 100 UNIT/ML IV SOLN
INTRAVENOUS | Status: AC
  Filled 2013-03-08: qty 5

## 2013-03-08 NOTE — Patient Instructions (Signed)
[facility] Discharge Instructions  RECOMMENDATIONS MADE BY THE CONSULTANT AND ANY TEST RESULTS WILL BE SENT TO YOUR REFERRING PHYSICIAN.  EXAM FINDINGS BY THE PHYSICIAN TODAY AND SIGNS OR SYMPTOMS TO REPORT TO CLINIC OR PRIMARY PHYSICIAN:  Chest Xray today. We will call you with results. Call by 4 today if we don't call you.   Reglan/Metoclopramide 5mg  tablet. Take 1-2 tablets three times a day before meals. Start with 1 tablet and increase to 2 tablets if needed. Take this around the clock.   Do not take Zofran or Compazine over the next few days.   Call us on Monday to let us know how the Reglan/metoclopramide is working for you. Call [facility] @ [phone].  Return in 3 months for labs and to see Dr. Zigmund Daniel.     Thank you for choosing [facility] to provide your oncology and hematology care.  To afford each patient quality time with our providers, please arrive at least 15 minutes before your scheduled appointment time.  With your help, our goal is to use those 15 minutes to complete the necessary work-up to ensure our physicians have the information they need to help with your evaluation and healthcare recommendations.    Effective January 1st, 2014, we ask that you re-schedule your appointment with our physicians should you arrive 10 or more minutes late for your appointment.  We strive to give you quality time with our providers, and arriving late affects you and other patients whose appointments are after yours.    Again, thank you for choosing [facility].  Our hope is that these requests will decrease the amount of time that you wait before being seen by our physicians.       _____________________________________________________________  Should you have questions after your visit to [facility], please contact our office at [phone] between the hours of 8:30 a.m. and 5:00 p.m.  Voicemails left after 4:30 p.m. will  not be returned until the following business day.  For prescription refill requests, have your pharmacy contact our office with your prescription refill request.

## 2013-03-08 NOTE — Progress Notes (Signed)
[facility]  OFFICE PROGRESS NOTE  DAVIDSON,ERIC, MD [address] [place] [address]  DIAGNOSIS: Adenocarcinoma of colon - Plan: CEA, CEA, CBC with Differential, Comprehensive metabolic panel, D-dimer, quantitative, DG Chest 2 View, CBC with Differential, Comprehensive metabolic panel, D-dimer, quantitative, CBC with Differential, Comprehensive metabolic panel, CEA, sodium chloride 0.9 % injection 10 mL, heparin lock flush 100 unit/mL  Pulmonary embolism - Plan: CEA, CEA, CBC with Differential, Comprehensive metabolic panel, D-dimer, quantitative, DG Chest 2 View, CBC with Differential, Comprehensive metabolic panel, D-dimer, quantitative, CBC with Differential, Comprehensive metabolic panel, CEA, sodium chloride 0.9 % injection 10 mL, heparin lock flush 100 unit/mL  Small bowel obstruction, s/p lap 09/2012 [place](adhesions)  Chief Complaint  Patient presents with  . Colon Cancer    CURRENT THERAPY: Watchful expectation.  INTERVAL HISTORY: [name] 62 y.o. male returns for followup of stage III-a colon carcinoma, status post left colon resection on 07/31/2010 followed by FOLFOX chemotherapy completing 7 of a planned 12 cycles given from 10/12/2010 until 01/04/2011. Last colonoscopy was 07/24/2012 at which time a tubular adenoma was found with no evidence of malignancy.  He was evaluated locally for small bowel obstruction was found to be a nonsurgical candidate but on return home to [facility] symptoms worsened and he was admitted to hospital at which time an exploratory laparotomy was done with lysis of adhesions. No metastatic disease was found. Postop he developed pneumonia and was in the hospital for almost 3 weeks. He is complaining today of left-sided chest discomfort with no fever or chills. He denies a sore throat, earache, headache, diarrhea, constipation, lower extremity swelling or redness, skin rash, headache, or  seizures. He does complain of abdominal bloating and intermittent nausea for which he uses Zofran which seems to help.  MEDICAL HISTORY: Past Medical History  Diagnosis Date  . Pulmonary embolism 02/2010  . Hypertension   . Osteoporosis   . Arthritis   . TIA (transient ischemic attack) 10/11  . ETOH abuse     quit 03/2010  . 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 Personal history of PE (pulmonary embolism) 10/01/2010  . Blood transfusion   . S/P endoscopy September 28, 2010    erosive reflux esophagitis, Billroth I anatomy  . Shortness of breath   . Sleep apnea   . Recurrent upper respiratory infection (URI)   . Anxiety   . Pneumonia   . Anemia   . Ileus   . Chronic abdominal pain   . GERD (gastroesophageal reflux disease)   . Chest pain at rest   . Erosive esophagitis   . Vitamin B12 deficiency   . Bowel obstruction 05/13/2012    Recurrent  . Hx of Clostridium difficile infection 01/2012  . 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    Stage 3    INTERIM HISTORY: has Adenocarcinoma of colon with mucinous features; Bleeding gastrointestinal; Supratherapeutic INR; Abdominal  pain, other specified site; Nausea and vomiting in adult; Anemia, chronic disease; Fever chills; S/P partial gastrectomy; Personal history of PE (pulmonary embolism); Bronchitis, acute; Erosive esophagitis; Esophageal reflux disease; Coffee ground emesis; Hematemesis; Chronic abdominal pain; Pancytopenia due to antineoplastic chemotherapy; Pneumonia; Pulmonary embolism; Chest pain at rest; Small bowel obstruction; Left sided abdominal pain; GI bleed; Coagulopathy; Gastroenteritis; Partial small bowel obstruction; Nausea; Abdominal distension; HTN (hypertension); Diarrhea; Cellulitis; Esophageal dysphagia; H/O Clostridium difficile infection; Rectal bleeding; Bowel obstruction; and Abnormal CT scan, colon on his problem list.  ALLERGIES:  has No Known Allergies.  MEDICATIONS: has a current medication list  which includes the following prescription(s): alendronate, atorvastatin, cholecalciferol, cyanocobalamin, cyclobenzaprine, dexlansoprazole, docusate sodium, enoxaparin, ensure plus, erythromycin, ferrous sulfate, folic acid, hydrocodone-acetaminophen, lorazepam, ondansetron, polyethylene glycol powder, prochlorperazine, psyllium, sertraline, sucralfate, thiamine, tramadol, valsartan-hydrochlorothiazide, vitamin d (ergocalciferol), wheat dextrin, and metoclopramide.  SURGICAL HISTORY:  Past Surgical History  Procedure Laterality Date  . Hernia repair      right inguinal  . Appendectomy  1980s  . Cholecystectomy  1980s  . Portacath placement    . Abdominal sugery      for bowel obstruction x 8, all in 1980s, except for one in 07/2010  . Esophagogastroduodenoscopy  09/28/2010  . Esophagogastroduodenoscopy  12/01/2010    Cervical web status post dilation, erosive esophagitis, B1 hemigastrectomy, inflamed anastomosis  . Colonoscopy  03/18/2011    anastomosis at 35cm. Several adenomatous polyps removed. Sigmoid diverticulosis. Next TCS 02/2013  . Esophagogastroduodenoscopy  04/16/2011    excoriation at GEJ c/w trauma/M-W tear, friable gastric anastomosis, dilation efferent limb  . Billroth 1 hemigastrectomy  1980s    per patient for benign duodenal tumor  . Esophagogastroduodenoscopy (egd) with esophageal dilation  02/25/2012    [id number] esophageal web-s/p dilation anddisruption as described above. Status post prior gastric with Billroth I configuration. Abnormal gastric mucosa at the anastomosis. Gastric biopsy showed mild chronic inflammation but no H. pylori   . Colonoscopy N/A 07/24/2012    Procedure: COLONOSCOPY;  Surgeon: Corbin Ade, MD;  Location: [facility];  Service: Endoscopy;  Laterality: N/A;  3:00  . Colon surgery  May 2012    left hemicolectomy, colon cancer found at time of surgery for bowel obstruction  . Cardiac catheterization  07/17/2012    FAMILY HISTORY: family history  includes Arthritis in his mother; Heart attack in his father; Hypertension in his father and mother; Pneumonia in his mother.  SOCIAL HISTORY:  reports that he has been smoking Cigarettes.  He has a 20 pack-year smoking history. He has never used smokeless tobacco. He reports that he does not drink alcohol or use illicit drugs.  REVIEW OF SYSTEMS:  Other than that discussed above is noncontributory.  PHYSICAL EXAMINATION: ECOG PERFORMANCE STATUS: 1 - Symptomatic but completely ambulatory  Blood pressure 125/88, pulse 107, temperature 97.8 F (36.6 C), temperature source Oral, resp. rate 20, weight 142 lb 11.2 oz (64.728 kg), SpO2 100.00%.  GENERAL:alert, no distress and comfortable SKIN: skin color, texture, turgor are normal, no rashes or significant lesions EYES: PERLA; Conjunctiva are pink and non-injected, sclera clear OROPHARYNX:no exudate, no erythema on lips, buccal mucosa, or tongue. NECK: supple, thyroid normal size, non-tender, without nodularity. No masses CHEST: Normal AP diameter with light port in place and left anterior chest. No breast masses. LYMPH:  no palpable lymphadenopathy in the cervical, axillary or inguinal LUNGS: clear to auscultation and percussion with normal breathing effort. No friction rub. HEART: regular rate & rhythm and no murmurs. ABDOMEN:abdomen soft, non-tender and normal bowel sounds. Surgical wounds well healed with no tympany. MUSCULOSKELETAL:no cyanosis of digits and no clubbing. Range of motion normal.  NEURO: alert & oriented x 3 with fluent speech, no focal motor/sensory deficits   LABORATORY DATA: Office Visit on 03/08/2013  Component Date Value Range Status  . WBC 03/08/2013 6.8  4.0 - 10.5 K/uL Final  . RBC 03/08/2013 4.77  4.22 - 5.81 MIL/uL Final  . Hemoglobin 03/08/2013 14.5  13.0 - 17.0 g/dL Final  . HCT 98/01/9146 42.9  39.0 -  52.0 % Final  . MCV 03/08/2013 89.9  78.0 - 100.0 fL Final  . MCH 03/08/2013 30.4  26.0 - 34.0 pg Final    . MCHC 03/08/2013 33.8  30.0 - 36.0 g/dL Final  . RDW 16/12/9602 15.4  11.5 - 15.5 % Final  . Platelets 03/08/2013 188  150 - 400 K/uL Final  . Neutrophils Relative % 03/08/2013 59  43 - 77 % Final  . Neutro Abs 03/08/2013 4.1  1.7 - 7.7 K/uL Final  . Lymphocytes Relative 03/08/2013 29  12 - 46 % Final  . Lymphs Abs 03/08/2013 2.0  0.7 - 4.0 K/uL Final  . Monocytes Relative 03/08/2013 10  3 - 12 % Final  . Monocytes Absolute 03/08/2013 0.7  0.1 - 1.0 K/uL Final  . Eosinophils Relative 03/08/2013 2  0 - 5 % Final  . Eosinophils Absolute 03/08/2013 0.1  0.0 - 0.7 K/uL Final  . Basophils Relative 03/08/2013 0  0 - 1 % Final  . Basophils Absolute 03/08/2013 0.0  0.0 - 0.1 K/uL Final  . Sodium 03/08/2013 137  135 - 145 mEq/L Final  . Potassium 03/08/2013 3.9  3.5 - 5.1 mEq/L Final  . Chloride 03/08/2013 100  96 - 112 mEq/L Final  . CO2 03/08/2013 26  19 - 32 mEq/L Final  . Glucose, Bld 03/08/2013 83  70 - 99 mg/dL Final  . BUN 54/11/8117 8  6 - 23 mg/dL Final  . Creatinine, Ser 03/08/2013 0.87  0.50 - 1.35 mg/dL Final  . Calcium 14/78/2956 8.9  8.4 - 10.5 mg/dL Final  . Total Protein 03/08/2013 6.9  6.0 - 8.3 g/dL Final  . Albumin 21/30/8657 3.5  3.5 - 5.2 g/dL Final  . AST 84/69/6295 16  0 - 37 U/L Final  . ALT 03/08/2013 13  0 - 53 U/L Final  . Alkaline Phosphatase 03/08/2013 80  39 - 117 U/L Final  . Total Bilirubin 03/08/2013 0.2* 0.3 - 1.2 mg/dL Final  . GFR calc non Af Amer 03/08/2013 >90  >90 mL/min Final  . GFR calc Af Amer 03/08/2013 >90  >90 mL/min Final   Comment: (NOTE)                          The eGFR has been calculated using the CKD EPI equation.                          This calculation has not been validated in all clinical situations.                          eGFR's persistently <90 mL/min signify possible Chronic Kidney                          Disease.  [name] D-Dimer, Quant 03/08/2013 0.28  0.00 - 0.48 ug/mL-FEU Final   Comment:                                 AT THE  INHOUSE ESTABLISHED CUTOFF                          VALUE OF 0.48 ug/mL FEU,  THIS ASSAY HAS BEEN DOCUMENTED                          IN THE LITERATURE TO HAVE                          A SENSITIVITY AND NEGATIVE                          PREDICTIVE VALUE OF AT LEAST                          98 TO 99%.  THE TEST RESULT                          SHOULD BE CORRELATED WITH                          AN ASSESSMENT OF THE CLINICAL                          PROBABILITY OF DVT / VTE.    PATHOLOGY: No new pathology. Recent abdominal expiration did not reveal evidence of metastatic disease.  Urinalysis    Component Value Date/Time   COLORURINE YELLOW 10/07/2012 2048   APPEARANCEUR CLOUDY* 10/07/2012 2048   LABSPEC >1.030* 10/07/2012 2048   PHURINE 5.5 10/07/2012 2048   GLUCOSEU NEGATIVE 10/07/2012 2048   HGBUR NEGATIVE 10/07/2012 2048   BILIRUBINUR MODERATE* 10/07/2012 2048   KETONESUR 15* 10/07/2012 2048   PROTEINUR 100* 10/07/2012 2048   UROBILINOGEN 0.2 10/07/2012 2048   NITRITE NEGATIVE 10/07/2012 2048   LEUKOCYTESUR NEGATIVE 10/07/2012 2048    RADIOGRAPHIC STUDIES: Chest x-ray done today looks normal to my observation..  ASSESSMENT:  #1. Status post a further laparotomy in July 2014 for lysis of adhesions which have produced small bowel obstruction. #2. Left-sided chest pain, possible recurrent pulmonary embolism while patient is using Lovenox 60 mg subcutaneous he daily. Chest x-ray will be done today and d-dimer was drawn. #3. Switch to Reglan 5-10 mg 3 times a day before meals to help with prevention of further production of adhesions. #4. 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 Original surgery was on 07/31/2010.   PLAN:  #1. D-dimer done today was normal. Chest x-ray without infiltrates. #2. Metoclopramide 5-10 mg 3 times a day before  meals. #3. Since d-dimer is elevated, Lovenox dose will remain the same. #4. Continue tramadol for pain. #5. Followup with labs in 3 months.    All questions were answered. The patient knows to call the clinic with any problems, questions or concerns. We can certainly see the patient much sooner if necessary.   I spent 25 minutes counseling the patient face to face. The total time spent in the appointment was 30 minutes.    Maurilio Lovely, MD 03/08/2013 12:38 PM

## 2013-03-08 NOTE — Progress Notes (Signed)
[name] presented for Portacath access and flush. Proper placement of portacath confirmed by CXR. Portacath located Lt chest wall accessed with  H 20 needle. Good blood return present. Portacath flushed with 20ml NS and 500U/59ml Heparin and needle removed intact. Procedure without incident. Patient tolerated procedure well.

## 2013-03-20 ENCOUNTER — Other Ambulatory Visit (HOSPITAL_COMMUNITY): Payer: Federal, State, Local not specified - PPO

## 2013-03-20 ENCOUNTER — Telehealth (HOSPITAL_COMMUNITY): Payer: Self-pay | Admitting: *Deleted

## 2013-03-20 NOTE — Telephone Encounter (Signed)
Patient called to let us know that he has been taking his Reglan as ordered by Dr. Zigmund Daniel. Patient states that nausea is under control however he does get a rapid heart rate when lying down at times. Dr. Zigmund Daniel instructed me to tell patient to only take Reglan BID (before breakfast & before supper). I called pt and had to leave a voicemail on his home answering machine. I asked that he call me back and let me know that he received the message and understood what to do.

## 2013-04-19 ENCOUNTER — Encounter (HOSPITAL_COMMUNITY): Payer: Federal, State, Local not specified - PPO | Attending: Hematology and Oncology

## 2013-04-19 DIAGNOSIS — Z452 Encounter for adjustment and management of vascular access device: Secondary | ICD-10-CM

## 2013-04-19 DIAGNOSIS — C189 Malignant neoplasm of colon, unspecified: Secondary | ICD-10-CM | POA: Insufficient documentation

## 2013-04-19 DIAGNOSIS — C186 Malignant neoplasm of descending colon: Secondary | ICD-10-CM

## 2013-04-19 DIAGNOSIS — I2699 Other pulmonary embolism without acute cor pulmonale: Secondary | ICD-10-CM | POA: Insufficient documentation

## 2013-04-19 MED ORDER — SODIUM CHLORIDE 0.9 % IJ SOLN
10.0000 mL | Freq: Once | INTRAMUSCULAR | Status: AC
  Administered 2013-04-19: 10 mL via INTRAVENOUS

## 2013-04-19 MED ORDER — HEPARIN SOD (PORK) LOCK FLUSH 100 UNIT/ML IV SOLN
INTRAVENOUS | Status: AC
  Filled 2013-04-19: qty 5

## 2013-04-19 MED ORDER — HEPARIN SOD (PORK) LOCK FLUSH 100 UNIT/ML IV SOLN
500.0000 [IU] | Freq: Once | INTRAVENOUS | Status: AC
  Administered 2013-04-19: 500 [IU] via INTRAVENOUS

## 2013-04-19 NOTE — Progress Notes (Signed)
[name] presented for Portacath access and flush.  Portacath located left chest wall accessed with  H 20 needle.  Good blood return present. Portacath flushed with 67ml NS and 500U/29ml Heparin and needle removed intact.  Procedure tolerated well and without incident.

## 2013-05-01 ENCOUNTER — Encounter: Payer: Self-pay | Admitting: Gastroenterology

## 2013-05-01 ENCOUNTER — Ambulatory Visit (INDEPENDENT_AMBULATORY_CARE_PROVIDER_SITE_OTHER): Payer: Federal, State, Local not specified - PPO | Admitting: Gastroenterology

## 2013-05-01 VITALS — BP 130/83 | HR 90 | Temp 97.5°F | Ht 69.0 in | Wt 144.2 lb

## 2013-05-01 DIAGNOSIS — C189 Malignant neoplasm of colon, unspecified: Secondary | ICD-10-CM

## 2013-05-01 DIAGNOSIS — R1310 Dysphagia, unspecified: Secondary | ICD-10-CM

## 2013-05-01 DIAGNOSIS — K2210 Ulcer of esophagus without bleeding: Secondary | ICD-10-CM

## 2013-05-01 DIAGNOSIS — R1314 Dysphagia, pharyngoesophageal phase: Secondary | ICD-10-CM

## 2013-05-01 DIAGNOSIS — K208 Other esophagitis without bleeding: Secondary | ICD-10-CM

## 2013-05-01 DIAGNOSIS — R1319 Other dysphagia: Secondary | ICD-10-CM

## 2013-05-01 DIAGNOSIS — K56609 Unspecified intestinal obstruction, unspecified as to partial versus complete obstruction: Secondary | ICD-10-CM

## 2013-05-01 MED ORDER — SUCRALFATE 1 GM/10ML PO SUSP: 1.0000 g | Freq: Four times a day (QID) | ORAL | Status: DC

## 2013-05-01 NOTE — Assessment & Plan Note (Addendum)
Recurrent small bowel obstructions. Patient reports having another surgery back in October. We have requested records. Hospitalized again in January for recurrent obstruction. Further recommendations to follow once records reviewed. Agree with need to limit antidiarrheals as much as possible.

## 2013-05-01 NOTE — Patient Instructions (Signed)
1. We will obtain copies of your medical records for review. We will call you if you need any further testing. 2. Prescription for Carafate sent to your pharmacy. 3. Office visit with Dr. [name] in 6 months.

## 2013-05-01 NOTE — Assessment & Plan Note (Signed)
Doing well on current therapy.

## 2013-05-01 NOTE — Assessment & Plan Note (Signed)
Followed by oncology. Up-to-date on his colonoscopies. Patient reports having an additional colonoscopy in [place] back in October. We have requested records. At this time he is not due for another colonoscopy until 2017.

## 2013-05-01 NOTE — Progress Notes (Signed)
Primary Care Physician:  Netta Cedars, MD  Primary Gastroenterologist:  Garfield Cornea, MD   Chief Complaint  Patient presents with  . Follow-up    HPI:  [name] is a 63 y.o. male here for followup. History of stage III colon cancer status post left hemicolectomy, postsurgical adjuvant chemotherapy. He also has a history of recurrent small bowel obstructions in the 1980s with approximately 8 to 9 surgeries. In May 2012 he had gallops traction and was found to have colon cancer. Patient reports recurrent bowel obstruction back in October when he was in [place]. He underwent surgery with lysis of adhesions, course complicated by pneumonia. States he was in intensive care and almost died. Subsequently was in the hospital last month with a recurrent bout prescription, treated conservatively, again in [place]. He reports having another colonoscopy in [place] back in October as well was told he had more polyps.  At this time he is doing reasonably well. He has a history of chronic diarrhea but this is well managed with current therapy. Medications were adjusted somewhat after his last hospitalization. He was advised to decrease his Lomotil use. He is averaging about 2 a day. He takes Colace daily. Takes MiraLax when necessary. On Dexilant for his reflux with good control. Appetite is good. No unintentional weight loss. No melena rectal bleeding. No dysphagia. Takes Carafate as needed for epigastric pain/burning but does not take often.  Current Outpatient Prescriptions  Medication Sig Dispense Refill  . alendronate (FOSAMAX) 70 MG tablet Take 70 mg by mouth every 7 (seven) days. On sundays      . atorvastatin (LIPITOR) 20 MG tablet Take 20 mg by mouth at bedtime.       . cholecalciferol (VITAMIN D) 1000 UNITS tablet Take 1,000 Units by mouth every morning.      . cyanocobalamin (,VITAMIN B-12,) 1000 MCG/ML injection Inject 1,000 mcg into the muscle every 30 (thirty) days.      .  cyclobenzaprine (FLEXERIL) 10 MG tablet Take 10 mg by mouth daily as needed for muscle spasms.       [name] dexlansoprazole (DEXILANT) 60 MG capsule Take 60 mg by mouth every morning.      . docusate sodium (COLACE) 100 MG capsule Take 100 mg by mouth 2 (two) times daily.      [name] enoxaparin (LOVENOX) 60 MG/0.6ML injection Inject 60 mg into the skin every evening.       [name] ENSURE PLUS (ENSURE PLUS) LIQD Take 237 mLs by mouth daily.      . ferrous sulfate 325 (65 FE) MG tablet Take 325 mg by mouth 2 (two) times daily.      . folic acid (FOLVITE) 1 MG tablet Take 1 mg by mouth every morning.       . hydrochlorothiazide (MICROZIDE) 12.5 MG capsule Take 12.5 mg by mouth daily.       [name] LORazepam (ATIVAN) 1 MG tablet Take 1 mg by mouth every 3 (three) hours as needed. anxiety      . metoCLOPramide (REGLAN) 5 MG tablet Take one or two tablets 3 times daily before meals.  100 tablet  6  . ondansetron (ZOFRAN) 4 MG tablet Take 4 mg by mouth daily as needed for nausea.      . polyethylene glycol powder (GLYCOLAX/MIRALAX) powder Take 17 g by mouth as needed. For constipation      . prochlorperazine (COMPAZINE) 10 MG tablet Take 10 mg by mouth every 6 (six) hours as needed. Nausea and Vomiting      .  psyllium (REGULOID) 0.52 G capsule Take 2.6 g by mouth daily. *5 capsules taken twice daily*      . sertraline (ZOLOFT) 100 MG tablet Take 100 mg by mouth at bedtime.      . sucralfate (CARAFATE) 1 GM/10ML suspension Take 1 g by mouth 4 (four) times daily as needed (for relief).       . thiamine 100 MG tablet Take 100 mg by mouth every morning.       . traMADol (ULTRAM) 50 MG tablet Take 50 mg by mouth every 6 (six) hours as needed.      . valsartan-hydrochlorothiazide (DIOVAN-HCT) 80-12.5 MG per tablet Take 1 tablet by mouth every morning.       . Vitamin D, Ergocalciferol, (DRISDOL) 50000 UNITS CAPS Take 50,000 Units by mouth once a week. Takes on Sunday      . Wheat Dextrin (BENEFIBER PO) Take 2 tablets by mouth  daily.        No current facility-administered medications for this visit.    Allergies as of 05/01/2013  . (No Known Allergies)    Past Medical History  Diagnosis Date  . Pulmonary embolism 02/2010  . Hypertension   . Osteoporosis   . Arthritis   . TIA (transient ischemic attack) 10/11  . ETOH abuse     quit 03/2010  . 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 Personal history of PE (pulmonary embolism) 10/01/2010  . Blood transfusion   . S/P endoscopy September 28, 2010    erosive reflux esophagitis, Billroth I anatomy  . Shortness of breath   . Sleep apnea   . Recurrent upper respiratory infection (URI)   . Anxiety   . Pneumonia   . Anemia   . Ileus   . Chronic abdominal pain   . GERD (gastroesophageal reflux disease)   . Chest pain at rest   . Erosive esophagitis   . Vitamin B12 deficiency   . Bowel obstruction 05/13/2012    Recurrent  . Hx of Clostridium difficile infection 01/2012  . 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 Hernia repair      right inguinal  . Appendectomy  1980s  . Cholecystectomy  1980s  . Portacath placement    . Abdominal sugery      for bowel obstruction x 8, all in 1980s, except for one in 07/2010  . Esophagogastroduodenoscopy  09/28/2010  . Esophagogastroduodenoscopy  12/01/2010    Cervical web status post dilation, erosive esophagitis, B1 hemigastrectomy, inflamed anastomosis  . Colonoscopy  03/18/2011    anastomosis at 35cm. Several adenomatous polyps removed. Sigmoid diverticulosis. Next TCS 02/2013  . Esophagogastroduodenoscopy  04/16/2011    excoriation at GEJ c/w trauma/M-W tear, friable gastric anastomosis, dilation efferent limb  . Billroth 1 hemigastrectomy  1980s    per patient for benign duodenal tumor  . Esophagogastroduodenoscopy (egd) with esophageal dilation  02/25/2012    [id number] esophageal web-s/p dilation anddisruption as described above. Status post  prior gastric with Billroth I configuration. Abnormal gastric mucosa at the anastomosis. Gastric biopsy showed mild chronic inflammation but no H. pylori   . Colonoscopy N/A 07/24/2012    GBT:DVVOHY post segmental resection with normal-appearing colonic anastomosis aside from an adjacent polyp-removed as described above. Rectal polyp-removed as described above. CT findings appear to have been artifactual. tubular adenomas/prolapsed type polyp.  . Colon surgery  May 2012    left hemicolectomy, colon  cancer found at time of surgery for bowel obstruction  . Cardiac catheterization  07/17/2012    Family History  Problem Relation Age of Onset  . Hypertension Mother   . Arthritis Mother   . Pneumonia Mother   . Hypertension Father   . Heart attack Father     History   Social History  . Marital Status: Married    Spouse Name: N/A    Number of Children: 3  . Years of Education: N/A   Occupational History  .  [employer]   Social History Main Topics  . Smoking status: Current Every Day Smoker -- 0.50 packs/day for 40 years    Types: Cigarettes  . Smokeless tobacco: Never Used  . Alcohol Use: No     Comment: last drink was Apr 16 2010  . Drug Use: No  . Sexual Activity: Yes   Other Topics Concern  . Not on file   Social History Narrative  . No narrative on file      ROS:  General: Negative for anorexia, weight loss, fever, chills, fatigue, weakness. Eyes: Negative for vision changes.  ENT: Negative for hoarseness, difficulty swallowing , nasal congestion. CV: Negative for chest pain, angina, palpitations, dyspnea on exertion, peripheral edema.  Respiratory: Negative for dyspnea at rest, dyspnea on exertion, cough, sputum, wheezing.  GI: See history of present illness. GU:  Negative for dysuria, hematuria, urinary incontinence, urinary frequency, nocturnal urination.  MS: Negative for joint pain, low back pain.  Derm: Negative for rash or itching.  Neuro: Negative for  weakness, abnormal sensation, seizure, frequent headaches, memory loss, confusion.  Psych: Negative for anxiety, depression, suicidal ideation, hallucinations.  Endo: Negative for unusual weight change.  Heme: Negative for bruising or bleeding. Allergy: Negative for rash or hives.    Physical Examination:  BP 130/83  Pulse 90  Temp(Src) 97.5 F (36.4 C) (Oral)  Ht 5\' 9"  (1.753 m)  Wt 144 lb 3.2 oz (65.409 kg)  BMI 21.29 kg/m2   General: Well-nourished, well-developed in no acute distress.  Head: Normocephalic, atraumatic.   Eyes: Conjunctiva pink, no icterus. Mouth: Oropharyngeal mucosa moist and pink , no lesions erythema or exudate. Neck: Supple without thyromegaly, masses, or lymphadenopathy.  Lungs: Clear to auscultation bilaterally.  Heart: Regular rate and rhythm, no murmurs rubs or gallops.  Abdomen: Bowel sounds are normal, nontender, nondistended, no hepatosplenomegaly or masses, no abdominal bruits or    hernia , no rebound or guarding.   Rectal: Not performed Extremities: No lower extremity edema. No clubbing or deformities.  Neuro: Alert and oriented x 4 , grossly normal neurologically.  Skin: Warm and dry, no rash or jaundice.   Psych: Alert and cooperative, normal mood and affect.  Labs: Lab Results  Component Value Date   ALT 13 03/08/2013   AST 16 03/08/2013   ALKPHOS 80 03/08/2013   BILITOT 0.2* 03/08/2013   Lab Results  Component Value Date   IRON 28* 02/06/2013   TIBC 371 02/06/2013   FERRITIN 25 02/06/2013   Lab Results  Component Value Date   WBC 6.8 03/08/2013   HGB 14.5 03/08/2013   HCT 42.9 03/08/2013   MCV 89.9 03/08/2013   PLT 188 03/08/2013   Lab Results  Component Value Date   CREATININE 0.87 03/08/2013   BUN 8 03/08/2013   NA 137 03/08/2013   K 3.9 03/08/2013   CL 100 03/08/2013   CO2 26 03/08/2013     Imaging Studies: No results found.

## 2013-05-01 NOTE — Progress Notes (Signed)
cc'd to pcp 

## 2013-05-02 NOTE — Progress Notes (Signed)
Records reviewed from [facility]. CT abdomen and pelvis with IV contrast on 03/21/2013 Impression: Multiple loops of dilated small bowel consistent with small bowel structures however there also appears to be dilation of the cecum with cecal tip located in the left lower artery. This may be related to prior surgery or something such as a cecal volvulus would have to be considered. A specific area of twisting or colonic narrowing is not visualized is usually seen with volvulus.  CT of the abdomen pelvis with contrast on 03/22/2013 Impression: Persistent small bowel dilation though improved from prior study with contrast from the prior CT scan having made its way into direct him, excluding high-grade or complete intestinal obstruction. Therefore, improved partial small bowel obstruction or marked small bowel ileus is suggested. Evidence of prior bowel surgery and gastric surgery with indeterminate bowel segment seen within the upper pelvis and dilated air filled cecum may be considered, there appears to be almost an end to end anastomosis with the segment of bowel which would be unusual as described above. As there is frank communication between this air filled distended bowel in the rectum, this does not appear these distention due to extraction but may be due to either denervation or colonic ileus. However as the segment of colon measures 10 cm in transverse dimension, the patient is at risk for spontaneous bowel perforation though this may be somewhat chronic. Prior cholecystectomy with extrahepatic bile duct dilation, then possibly physiological due to the absence of the gallbladder as discussed above. Possible mural thickening or edema involving the wall of the distal esophagus and the degree of esophagitis cannot be excluded.  Labs from 04/22/2013 White blood cell count 8600, hemoglobin 14.7, platelets 162,000, creatinine 0.94, total bilirubin 0.46, AST 17, ALT 14, albumin 3.9, lipase 18,  amylase 57.  Hospitalized on October 1 through 01/06/2013 for recurrent persistent large bowel obstruction requiring exploratory laparotomy with extensive lysis of adhesion as well as a subtotal colectomy with a cecal-sigmoid stapled anastomosis. Course complicated by left upper lobe pneumonia.: Pathologic showed dilation and serosal fibrous adhesions of the colon.  Never received copy of TCS from 12/2012 from [place].

## 2013-05-07 NOTE — Progress Notes (Signed)
Please let patient know that I discussed his case with RMR. Went over records from [place]. RMR suggested patient stop Reglan as there is no clear evidence that Reglan helps reduce chances of recurrent bowel obstructions.   OV here with RMR in 4 months.

## 2013-05-07 NOTE — Progress Notes (Signed)
FYI. Discussed cecal distention with Dr. [name]. No additional recommendations regarding this.

## 2013-05-24 NOTE — Progress Notes (Signed)
Tried to call pt- NA 

## 2013-05-30 NOTE — Progress Notes (Signed)
Tried to call pt- NA 

## 2013-05-31 ENCOUNTER — Encounter (HOSPITAL_COMMUNITY): Payer: Federal, State, Local not specified - PPO | Attending: Hematology and Oncology

## 2013-05-31 ENCOUNTER — Encounter (HOSPITAL_BASED_OUTPATIENT_CLINIC_OR_DEPARTMENT_OTHER): Payer: Federal, State, Local not specified - PPO

## 2013-05-31 ENCOUNTER — Encounter (HOSPITAL_COMMUNITY): Payer: Self-pay

## 2013-05-31 VITALS — BP 121/84 | HR 92 | Temp 97.8°F | Resp 16 | Wt 141.6 lb

## 2013-05-31 DIAGNOSIS — C189 Malignant neoplasm of colon, unspecified: Secondary | ICD-10-CM | POA: Insufficient documentation

## 2013-05-31 DIAGNOSIS — I2699 Other pulmonary embolism without acute cor pulmonale: Secondary | ICD-10-CM | POA: Diagnosis present

## 2013-05-31 DIAGNOSIS — I998 Other disorder of circulatory system: Secondary | ICD-10-CM | POA: Diagnosis present

## 2013-05-31 DIAGNOSIS — Z86711 Personal history of pulmonary embolism: Secondary | ICD-10-CM

## 2013-05-31 DIAGNOSIS — K56609 Unspecified intestinal obstruction, unspecified as to partial versus complete obstruction: Secondary | ICD-10-CM | POA: Diagnosis present

## 2013-05-31 DIAGNOSIS — I878 Other specified disorders of veins: Secondary | ICD-10-CM

## 2013-05-31 DIAGNOSIS — Z452 Encounter for adjustment and management of vascular access device: Secondary | ICD-10-CM

## 2013-05-31 DIAGNOSIS — C186 Malignant neoplasm of descending colon: Secondary | ICD-10-CM

## 2013-05-31 LAB — COMPREHENSIVE METABOLIC PANEL
ALT: 15 U/L (ref 0–53)
AST: 20 U/L (ref 0–37)
Albumin: 4 g/dL (ref 3.5–5.2)
Alkaline Phosphatase: 78 U/L (ref 39–117)
BUN: 11 mg/dL (ref 6–23)
CALCIUM: 9 mg/dL (ref 8.4–10.5)
CO2: 29 meq/L (ref 19–32)
CREATININE: 0.86 mg/dL (ref 0.50–1.35)
Chloride: 99 mEq/L (ref 96–112)
GFR calc Af Amer: >90 mL/min (ref >90)
GLUCOSE: 81 mg/dL (ref 70–99)
Potassium: 3.9 mEq/L (ref 3.7–5.3)
Sodium: 139 mEq/L (ref 137–147)
Total Bilirubin: 0.2 mg/dL — ABNORMAL LOW (ref 0.3–1.2)
Total Protein: 7.3 g/dL (ref 6.0–8.3)

## 2013-05-31 LAB — CBC WITH DIFFERENTIAL/PLATELET
Basophils Absolute: 0 10*3/uL (ref 0.0–0.1)
Basophils Relative: 0 % (ref 0–1)
Eosinophils Absolute: 0.1 10*3/uL (ref 0.0–0.7)
Eosinophils Relative: 1 % (ref 0–5)
HEMATOCRIT: 44.7 % (ref 39.0–52.0)
Hemoglobin: 15.5 g/dL (ref 13.0–17.0)
LYMPHS ABS: 2.5 10*3/uL (ref 0.7–4.0)
LYMPHS PCT: 27 % (ref 12–46)
MCH: 30.9 pg (ref 26.0–34.0)
MCHC: 34.7 g/dL (ref 30.0–36.0)
MCV: 89.2 fL (ref 78.0–100.0)
MONO ABS: 1 10*3/uL (ref 0.1–1.0)
Monocytes Relative: 10 % (ref 3–12)
Neutro Abs: 5.8 10*3/uL (ref 1.7–7.7)
Neutrophils Relative %: 62 % (ref 43–77)
Platelets: 209 10*3/uL (ref 150–400)
RBC: 5.01 MIL/uL (ref 4.22–5.81)
RDW: 14.5 % (ref 11.5–15.5)
WBC: 9.3 10*3/uL (ref 4.0–10.5)

## 2013-05-31 MED ORDER — HEPARIN SOD (PORK) LOCK FLUSH 100 UNIT/ML IV SOLN
500.0000 [IU] | Freq: Once | INTRAVENOUS | Status: AC
  Administered 2013-05-31: 500 [IU] via INTRAVENOUS
  Filled 2013-05-31: qty 5

## 2013-05-31 MED ORDER — SODIUM CHLORIDE 0.9 % IJ SOLN
10.0000 mL | INTRAMUSCULAR | Status: DC | PRN
  Administered 2013-05-31: 10 mL via INTRAVENOUS
  Filled 2013-05-31: qty 10

## 2013-05-31 NOTE — Progress Notes (Signed)
[name] presented for Portacath access and flush. Proper placement of portacath confirmed by CXR. Portacath located left chest wall accessed with  H 20 needle. Good blood return present.  Blood drawn for labs. Portacath flushed with 77ml NS and 500U/37ml Heparin and needle removed intact. Procedure without incident. Patient tolerated procedure well.

## 2013-05-31 NOTE — Progress Notes (Signed)
[facility]  OFFICE PROGRESS NOTE  [place], [place] [address]  DIAGNOSIS: Poor venous access - Plan: Schedule Portacath Flush Appointment, heparin lock flush 100 unit/mL, sodium chloride 0.9 % injection 10 mL  Adenocarcinoma of colon  Small bowel obstruction secondary to adhesions January 2015  Chief Complaint  Patient presents with  . History of stage III colon cancer  . Small bowel obstruction treated conservatively in January 20    CURRENT THERAPY: Watchful expectation.  INTERVAL HISTORY: [name] 63 y.o. male returns for followup of stage III colon cancer diagnosed in may of 2012 followed by FOLFOX for 7 of a planned 12 treatments ending on 01/04/2011 because of neuropathy. He suffered a small bowel obstruction in January 2015  while in [place] treated conservatively with NG tube decompression. He does experience intermittent nausea and does take metoclopramide on a regular basis. He denies any melena, hematochezia, hematuria, vomiting, abdominal distention, cough, wheezing, sore throat, with no improvement in peripheral paresthesias but with no compromise in the ability to eat, manipulate buttons, or write. He recently saw his gastroenterologist plans a repeat colonoscopy in about 2 years.   MEDICAL HISTORY: Past Medical History  Diagnosis Date  . Pulmonary embolism 02/2010  . Hypertension   . Osteoporosis   . Arthritis   . TIA (transient ischemic attack) 10/11  . ETOH abuse     quit 03/2010  . 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 Personal history of PE (pulmonary embolism) 10/01/2010  . Blood transfusion   . S/P endoscopy September 28, 2010    erosive reflux esophagitis, Billroth I anatomy  . Shortness of breath   . Sleep apnea   . Recurrent upper respiratory infection (URI)   . Anxiety   . Pneumonia   . Anemia   . Ileus   . Chronic abdominal pain   . GERD (gastroesophageal reflux  disease)   . Chest pain at rest   . Erosive esophagitis   . Vitamin B12 deficiency   . Bowel obstruction 05/13/2012    Recurrent  . Hx of Clostridium difficile infection 01/2012  . 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 Bronchitis     INTERIM HISTORY: has Adenocarcinoma of colon with mucinous features; Bleeding gastrointestinal; Supratherapeutic INR; Abdominal  pain, other specified site; Nausea and vomiting in adult; Anemia, chronic disease; Fever chills; S/P partial gastrectomy; Personal history of PE (pulmonary embolism); Bronchitis, acute; Erosive esophagitis; Esophageal reflux disease; Coffee ground emesis; Hematemesis; Chronic abdominal pain; Pancytopenia due to antineoplastic chemotherapy; Pneumonia; Pulmonary embolism; Chest pain at rest; Small bowel obstruction; Left sided abdominal pain; GI bleed; Coagulopathy; Gastroenteritis; Partial small bowel obstruction; Nausea; Abdominal distension; HTN (hypertension); Diarrhea; Cellulitis; Esophageal dysphagia; H/O Clostridium difficile infection; Rectal bleeding; Bowel obstruction; and Abnormal CT scan, colon on his problem list.   Stage III-a colon carcinoma, status post left colon resection on 07/31/2010 followed by FOLFOX chemotherapy completing 7 of a planned 12 cycles given from 10/12/2010 until 01/04/2011. Last colonoscopy was 07/24/2012 at which time a tubular adenoma was found with no evidence of malignancy. Small bowel obstruction, partial, treated conservatively in [place], [place] in January 2015.  ALLERGIES:  has No Known Allergies.  MEDICATIONS: has a current medication list which includes the following prescription(s): albuterol, alendronate, atorvastatin, butalbital-acetaminophen-caffeine, cholecalciferol, cyanocobalamin, cyclobenzaprine, dexlansoprazole, dicyclomine, diphenoxylate-atropine, docusate sodium, enoxaparin, ensure plus, ferrous [id number] c-folic acid, folic acid, guaifenesin,  lorazepam, metoclopramide, ondansetron, polyethylene glycol powder, prochlorperazine, psyllium, sertraline, sucralfate, thiamine, tramadol, valsartan-hydrochlorothiazide, vitamin d (ergocalciferol), wheat dextrin, and hydrochlorothiazide, and the following Facility-Administered Medications: sodium chloride.  SURGICAL HISTORY:  Past Surgical History  Procedure Laterality Date  . Hernia repair      right inguinal  . Appendectomy  1980s  . Cholecystectomy  1980s  . Portacath placement    . Abdominal sugery      for bowel obstruction x 8, all in 1980s, except for one in 07/2010  . Esophagogastroduodenoscopy  09/28/2010  . Esophagogastroduodenoscopy  12/01/2010    Cervical web status post dilation, erosive esophagitis, B1 hemigastrectomy, inflamed anastomosis  . Colonoscopy  03/18/2011    anastomosis at 35cm. Several adenomatous polyps removed. Sigmoid diverticulosis. Next TCS 02/2013  . Esophagogastroduodenoscopy  04/16/2011    excoriation at GEJ c/w trauma/M-W tear, friable gastric anastomosis, dilation efferent limb  . Billroth 1 hemigastrectomy  1980s    per patient for benign duodenal tumor  . Esophagogastroduodenoscopy (egd) with esophageal dilation  02/25/2012    UEA:VWUJWJXB esophageal web-s/p dilation anddisruption as described above. Status post prior gastric with Billroth I configuration. Abnormal gastric mucosa at the anastomosis. Gastric biopsy showed mild chronic inflammation but no H. pylori   . Colonoscopy N/A 07/24/2012    JYN:WGNFAO post segmental resection with normal-appearing colonic anastomosis aside from an adjacent polyp-removed as described above. Rectal polyp-removed as described above. CT findings appear to have been artifactual. tubular adenomas/prolapsed type polyp.  . Colon surgery  May 2012    left hemicolectomy, colon cancer found at time of surgery for bowel obstruction  . Cardiac catheterization  07/17/2012    FAMILY HISTORY: family history includes Arthritis in his  mother; Heart attack in his father; Hypertension in his father and mother; Pneumonia in his mother.  SOCIAL HISTORY:  reports that he has been smoking Cigarettes.  He has a 20 pack-year smoking history. He has never used smokeless tobacco. He reports that he does not drink alcohol or use illicit drugs.  REVIEW OF SYSTEMS:  Other than that discussed above is noncontributory.  PHYSICAL EXAMINATION: ECOG PERFORMANCE STATUS: 1 - Symptomatic but completely ambulatory  Blood pressure 121/84, pulse 92, temperature 97.8 F (36.6 C), temperature source Oral, resp. rate 16, weight 141 lb 9.6 oz (64.229 kg).  GENERAL:alert, no distress and comfortable SKIN: skin color, texture, turgor are normal, no rashes or significant lesions EYES: PERLA; Conjunctiva are pink and non-injected, sclera clear OROPHARYNX:no exudate, no erythema on lips, buccal mucosa, or tongue. NECK: supple, thyroid normal size, non-tender, without nodularity. No masses CHEST: Normal AP diameter with no gynecomastia. LYMPH:  no palpable lymphadenopathy in the cervical, axillary or inguinal LUNGS: clear to auscultation and percussion with normal breathing effort HEART: regular rate & rhythm and no murmurs. ABDOMEN:abdomen soft, non-tender and normal bowel sounds. No distention. No rebound tenderness. MUSCULOSKELETAL:no cyanosis of digits and no clubbing. Range of motion normal.  NEURO: alert & oriented x 3 with fluent speech, no focal motor/sensory deficits   LABORATORY DATA: No visits with results within 30 Day(s) from this visit. Latest known visit with results is:  Office Visit on 03/08/2013  Component Date Value Ref Range Status  . CEA 03/08/2013 1.2  0.0 - 5.0 ng/mL Final   Performed at [facility]  . WBC 03/08/2013 6.8  4.0 - 10.5 K/uL Final  . RBC 03/08/2013 4.77  4.22 - 5.81 MIL/uL Final  . Hemoglobin 03/08/2013 14.5  13.0 - 17.0 g/dL Final  . HCT 03/08/2013  42.9  39.0 - 52.0 % Final  . MCV 03/08/2013 89.9   78.0 - 100.0 fL Final  . MCH 03/08/2013 30.4  26.0 - 34.0 pg Final  . MCHC 03/08/2013 33.8  30.0 - 36.0 g/dL Final  . RDW 03/08/2013 15.4  11.5 - 15.5 % Final  . Platelets 03/08/2013 188  150 - 400 K/uL Final  . Neutrophils Relative % 03/08/2013 59  43 - 77 % Final  . Neutro Abs 03/08/2013 4.1  1.7 - 7.7 K/uL Final  . Lymphocytes Relative 03/08/2013 29  12 - 46 % Final  . Lymphs Abs 03/08/2013 2.0  0.7 - 4.0 K/uL Final  . Monocytes Relative 03/08/2013 10  3 - 12 % Final  . Monocytes Absolute 03/08/2013 0.7  0.1 - 1.0 K/uL Final  . Eosinophils Relative 03/08/2013 2  0 - 5 % Final  . Eosinophils Absolute 03/08/2013 0.1  0.0 - 0.7 K/uL Final  . Basophils Relative 03/08/2013 0  0 - 1 % Final  . Basophils Absolute 03/08/2013 0.0  0.0 - 0.1 K/uL Final  . Sodium 03/08/2013 137  135 - 145 mEq/L Final  . Potassium 03/08/2013 3.9  3.5 - 5.1 mEq/L Final  . Chloride 03/08/2013 100  96 - 112 mEq/L Final  . CO2 03/08/2013 26  19 - 32 mEq/L Final  . Glucose, Bld 03/08/2013 83  70 - 99 mg/dL Final  . BUN 03/08/2013 8  6 - 23 mg/dL Final  . Creatinine, Ser 03/08/2013 0.87  0.50 - 1.35 mg/dL Final  . Calcium 03/08/2013 8.9  8.4 - 10.5 mg/dL Final  . Total Protein 03/08/2013 6.9  6.0 - 8.3 g/dL Final  . Albumin 03/08/2013 3.5  3.5 - 5.2 g/dL Final  . AST 03/08/2013 16  0 - 37 U/L Final  . ALT 03/08/2013 13  0 - 53 U/L Final  . Alkaline Phosphatase 03/08/2013 80  39 - 117 U/L Final  . Total Bilirubin 03/08/2013 0.2* 0.3 - 1.2 mg/dL Final  . GFR calc non Af Amer 03/08/2013 >90  >90 mL/min Final  . GFR calc Af Amer 03/08/2013 >90  >90 mL/min Final   Comment: (NOTE)                          The eGFR has been calculated using the CKD EPI equation.                          This calculation has not been validated in all clinical situations.                          eGFR's persistently <90 mL/min signify possible Chronic Kidney                          Disease.  [name] D-Dimer, Quant 03/08/2013 0.28  0.00 - 0.48  ug/mL-FEU Final   Comment:                                 AT THE INHOUSE ESTABLISHED CUTOFF                          VALUE OF 0.48 ug/mL FEU,  THIS ASSAY HAS BEEN DOCUMENTED                          IN THE LITERATURE TO HAVE                          A SENSITIVITY AND NEGATIVE                          PREDICTIVE VALUE OF AT LEAST                          98 TO 99%.  THE TEST RESULT                          SHOULD BE CORRELATED WITH                          AN ASSESSMENT OF THE CLINICAL                          PROBABILITY OF DVT / VTE.    PATHOLOGY: No new pathology.  Urinalysis    Component Value Date/Time   COLORURINE YELLOW 10/07/2012 2048   APPEARANCEUR CLOUDY* 10/07/2012 2048   LABSPEC >1.030* 10/07/2012 2048   PHURINE 5.5 10/07/2012 2048   GLUCOSEU NEGATIVE 10/07/2012 2048   HGBUR NEGATIVE 10/07/2012 2048   BILIRUBINUR MODERATE* 10/07/2012 2048   KETONESUR 15* 10/07/2012 2048   PROTEINUR 100* 10/07/2012 2048   UROBILINOGEN 0.2 10/07/2012 2048   NITRITE NEGATIVE 10/07/2012 2048   LEUKOCYTESUR NEGATIVE 10/07/2012 2048    RADIOGRAPHIC STUDIES: CT scan done in [place], [place] in January 2015 showed no evidence of metastatic disease but with evidence of partial small bowel obstruction..  ASSESSMENT:  #1. Stage III-a left colon cancer, status post sigmoid resection followed by 7 cycles of FOLFOX producing peripheral neuropathy necessitating discontinuation of treatment with original surgery in 07/31/2010, no evidence of disease on last CT scan done in January 2015. #2. Intermittent small bowel obstruction, controlled with metoclopramide, status post surgery in July 2014 for lysis of adhesions. #3. History of pulmonary embolism, no evidence of recurrence   PLAN:  #1. Port flush with lab tests today. #2. Port flush in 6 weeks and again in 3 months with followup visit to include CBC, chem profile, and CEA. #3. Next CAT scan will be done in January  2016.   All questions were answered. The patient knows to call the clinic with any problems, questions or concerns. We can certainly see the patient much sooner if necessary.   I spent 25 minutes counseling the patient face to face. The total time spent in the appointment was 30 minutes.    Doroteo Bradford, MD 05/31/2013 11:15 AM

## 2013-05-31 NOTE — Patient Instructions (Signed)
[facility] Discharge Instructions  RECOMMENDATIONS MADE BY THE CONSULTANT AND ANY TEST RESULTS WILL BE SENT TO YOUR REFERRING PHYSICIAN.  EXAM FINDINGS BY THE PHYSICIAN TODAY AND SIGNS OR SYMPTOMS TO REPORT TO CLINIC OR PRIMARY PHYSICIAN: Exam and findings as discussed by Dr. Barnet Glasgow.  Report changes in bowel habits, blood in your stool, unexplained weight loss, etc.  MEDICATIONS PRESCRIBED:  none  INSTRUCTIONS/FOLLOW-UP: Port flushes every 6 weeks and office visit in 3 months.  Thank you for choosing [facility] to provide your oncology and hematology care.  To afford each patient quality time with our providers, please arrive at least 15 minutes before your scheduled appointment time.  With your help, our goal is to use those 15 minutes to complete the necessary work-up to ensure our physicians have the information they need to help with your evaluation and healthcare recommendations.    Effective January 1st, 2014, we ask that you re-schedule your appointment with our physicians should you arrive 10 or more minutes late for your appointment.  We strive to give you quality time with our providers, and arriving late affects you and other patients whose appointments are after yours.    Again, thank you for choosing [facility].  Our hope is that these requests will decrease the amount of time that you wait before being seen by our physicians.       _____________________________________________________________  Should you have questions after your visit to [facility], please contact our office at [phone] between the hours of 8:30 a.m. and 5:00 p.m.  Voicemails left after 4:30 p.m. will not be returned until the following business day.  For prescription refill requests, have your pharmacy contact our office with your prescription refill request.

## 2013-06-01 LAB — CEA: CEA: 1.4 ng/mL (ref 0.0–5.0)

## 2013-06-18 NOTE — Progress Notes (Signed)
Reminder in epic °

## 2013-06-18 NOTE — Progress Notes (Signed)
Unable to reach. Mailed letter. [name] please schedule ov

## 2013-07-12 ENCOUNTER — Encounter (HOSPITAL_COMMUNITY): Payer: Federal, State, Local not specified - PPO

## 2013-07-16 ENCOUNTER — Encounter (HOSPITAL_COMMUNITY): Payer: Federal, State, Local not specified - PPO | Attending: Hematology and Oncology

## 2013-07-16 DIAGNOSIS — Z452 Encounter for adjustment and management of vascular access device: Secondary | ICD-10-CM

## 2013-07-16 DIAGNOSIS — Z95828 Presence of other vascular implants and grafts: Secondary | ICD-10-CM

## 2013-07-16 DIAGNOSIS — C189 Malignant neoplasm of colon, unspecified: Secondary | ICD-10-CM

## 2013-07-16 DIAGNOSIS — Z9889 Other specified postprocedural states: Secondary | ICD-10-CM | POA: Insufficient documentation

## 2013-07-16 MED ORDER — SODIUM CHLORIDE 0.9 % IJ SOLN
10.0000 mL | INTRAMUSCULAR | Status: DC | PRN
  Administered 2013-07-16: 10 mL via INTRAVENOUS

## 2013-07-16 MED ORDER — HEPARIN SOD (PORK) LOCK FLUSH 100 UNIT/ML IV SOLN
500.0000 [IU] | Freq: Once | INTRAVENOUS | Status: AC
  Administered 2013-07-16: 500 [IU] via INTRAVENOUS
  Filled 2013-07-16: qty 5

## 2013-07-16 NOTE — Progress Notes (Signed)
[name] presented for Portacath access and flush. Portacath located lt chest wall accessed with  H 20 needle. Good blood return present. Portacath flushed with 37ml NS and 500U/64ml Heparin and needle removed intact. Procedure without incident. Patient tolerated procedure well.

## 2013-08-23 ENCOUNTER — Encounter (HOSPITAL_COMMUNITY): Payer: Self-pay

## 2013-08-23 ENCOUNTER — Encounter (HOSPITAL_BASED_OUTPATIENT_CLINIC_OR_DEPARTMENT_OTHER): Payer: Federal, State, Local not specified - PPO

## 2013-08-23 ENCOUNTER — Encounter (HOSPITAL_COMMUNITY): Payer: Federal, State, Local not specified - PPO | Attending: Hematology and Oncology

## 2013-08-23 VITALS — BP 155/100 | HR 96 | Temp 98.8°F | Resp 20 | Wt 144.4 lb

## 2013-08-23 DIAGNOSIS — C801 Malignant (primary) neoplasm, unspecified: Secondary | ICD-10-CM | POA: Diagnosis present

## 2013-08-23 DIAGNOSIS — Z9889 Other specified postprocedural states: Secondary | ICD-10-CM | POA: Insufficient documentation

## 2013-08-23 DIAGNOSIS — I998 Other disorder of circulatory system: Secondary | ICD-10-CM | POA: Insufficient documentation

## 2013-08-23 DIAGNOSIS — K56609 Unspecified intestinal obstruction, unspecified as to partial versus complete obstruction: Secondary | ICD-10-CM | POA: Diagnosis present

## 2013-08-23 DIAGNOSIS — I878 Other specified disorders of veins: Secondary | ICD-10-CM

## 2013-08-23 DIAGNOSIS — I2699 Other pulmonary embolism without acute cor pulmonale: Secondary | ICD-10-CM

## 2013-08-23 DIAGNOSIS — C189 Malignant neoplasm of colon, unspecified: Secondary | ICD-10-CM | POA: Insufficient documentation

## 2013-08-23 DIAGNOSIS — Z7901 Long term (current) use of anticoagulants: Secondary | ICD-10-CM

## 2013-08-23 DIAGNOSIS — Z95828 Presence of other vascular implants and grafts: Secondary | ICD-10-CM

## 2013-08-23 DIAGNOSIS — C186 Malignant neoplasm of descending colon: Secondary | ICD-10-CM

## 2013-08-23 LAB — CBC WITH DIFFERENTIAL/PLATELET
BASOS ABS: 0 10*3/uL (ref 0.0–0.1)
Basophils Relative: 0 % (ref 0–1)
EOS ABS: 0.1 10*3/uL (ref 0.0–0.7)
Eosinophils Relative: 2 % (ref 0–5)
HCT: 41.1 % (ref 39.0–52.0)
Hemoglobin: 13.9 g/dL (ref 13.0–17.0)
Lymphocytes Relative: 32 % (ref 12–46)
Lymphs Abs: 2.4 10*3/uL (ref 0.7–4.0)
MCH: 30.8 pg (ref 26.0–34.0)
MCHC: 33.8 g/dL (ref 30.0–36.0)
MCV: 91.1 fL (ref 78.0–100.0)
Monocytes Absolute: 0.8 10*3/uL (ref 0.1–1.0)
Monocytes Relative: 10 % (ref 3–12)
Neutro Abs: 4.2 10*3/uL (ref 1.7–7.7)
Neutrophils Relative %: 56 % (ref 43–77)
PLATELETS: 171 10*3/uL (ref 150–400)
RBC: 4.51 MIL/uL (ref 4.22–5.81)
RDW: 14.4 % (ref 11.5–15.5)
WBC: 7.6 10*3/uL (ref 4.0–10.5)

## 2013-08-23 LAB — COMPREHENSIVE METABOLIC PANEL
ALBUMIN: 3.3 g/dL — AB (ref 3.5–5.2)
ALT: 16 U/L (ref 0–53)
AST: 18 U/L (ref 0–37)
Alkaline Phosphatase: 59 U/L (ref 39–117)
BUN: 6 mg/dL (ref 6–23)
CO2: 21 mEq/L (ref 19–32)
Calcium: 8.2 mg/dL — ABNORMAL LOW (ref 8.4–10.5)
Chloride: 105 mEq/L (ref 96–112)
Creatinine, Ser: 0.83 mg/dL (ref 0.50–1.35)
GFR calc Af Amer: >90 mL/min (ref >90)
GFR calc non Af Amer: >90 mL/min (ref >90)
Glucose, Bld: 94 mg/dL (ref 70–99)
Potassium: 4 mEq/L (ref 3.7–5.3)
SODIUM: 142 meq/L (ref 137–147)
TOTAL PROTEIN: 6 g/dL (ref 6.0–8.3)
Total Bilirubin: 0.2 mg/dL — ABNORMAL LOW (ref 0.3–1.2)

## 2013-08-23 MED ORDER — HEPARIN SOD (PORK) LOCK FLUSH 100 UNIT/ML IV SOLN
500.0000 [IU] | Freq: Once | INTRAVENOUS | Status: AC
  Administered 2013-08-23: 500 [IU] via INTRAVENOUS
  Filled 2013-08-23: qty 5

## 2013-08-23 MED ORDER — SODIUM CHLORIDE 0.9 % IJ SOLN
10.0000 mL | INTRAMUSCULAR | Status: DC | PRN
  Administered 2013-08-23: 10 mL via INTRAVENOUS

## 2013-08-23 NOTE — Patient Instructions (Signed)
[facility] Discharge Instructions  RECOMMENDATIONS MADE BY THE CONSULTANT AND ANY TEST RESULTS WILL BE SENT TO YOUR REFERRING PHYSICIAN.  Patient was told to try Reglan again at 10 mg 4 times a day before meals and at bedtime to help with bowel symptoms.   Followup in 3 months with CBC, chem profile, and CEA.  Because of his intestinal problems and the doubt about absorption of oral agents, Xarelto will not be offered as an alternative to Lovenox although a discussion was held today regarding its use.   Thank you for choosing [facility] to provide your oncology and hematology care.  To afford each patient quality time with our providers, please arrive at least 15 minutes before your scheduled appointment time.  With your help, our goal is to use those 15 minutes to complete the necessary work-up to ensure our physicians have the information they need to help with your evaluation and healthcare recommendations.    Effective January 1st, 2014, we ask that you re-schedule your appointment with our physicians should you arrive 10 or more minutes late for your appointment.  We strive to give you quality time with our providers, and arriving late affects you and other patients whose appointments are after yours.    Again, thank you for choosing [facility].  Our hope is that these requests will decrease the amount of time that you wait before being seen by our physicians.       _____________________________________________________________  Should you have questions after your visit to [facility], please contact our office at [phone] between the hours of 8:30 a.m. and 5:00 p.m.  Voicemails left after 4:30 p.m. will not be returned until the following business day.  For prescription refill requests, have your pharmacy contact our office with your prescription refill request.

## 2013-08-23 NOTE — Progress Notes (Signed)
[place], [place] [address] [place] [address]  DIAGNOSIS: Port catheter in place - Plan: Schedule Portacath Flush Appointment, heparin lock flush 100 unit/mL, sodium chloride 0.9 % injection 10 mL  Adenocarcinoma - Plan: CBC with Differential, Comprehensive metabolic panel, CEA  No chief complaint on file.   CURRENT THERAPY: Watchful expectation.  INTERVAL HISTORY: [name] 63 y.o. male returns for followup of stage III colon cancer diagnosed in may of 2012 followed by FOLFOX for 7 of a planned 12 cycles ending on 01/04/2011 because of neuropathy. In January 2015 he suffered a small bowel obstruction treated conservatively with NG tube decompression.  He does develop intermittent abdominal swelling but without nausea or vomiting. He denies any melena, hematochezia, hematuria, epistaxis, hemoptysis, or chest pain. He also denies any PND, orthopnea, palpitations, lower extremity swelling or redness, skin rash, fever, but with occasional chills.  MEDICAL HISTORY: Past Medical History  Diagnosis Date  . Pulmonary embolism 02/2010  . Hypertension   . Osteoporosis   . Arthritis   . TIA (transient ischemic attack) 10/11  . ETOH abuse     quit 03/2010  . 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 Personal history of PE (pulmonary embolism) 10/01/2010  . Blood transfusion   . S/P endoscopy September 28, 2010    erosive reflux esophagitis, Billroth I anatomy  . Shortness of breath   . Sleep apnea   . Recurrent upper respiratory infection (URI)   . Anxiety   . Pneumonia   . Anemia   . Ileus   . Chronic abdominal pain   . GERD (gastroesophageal reflux disease)   . Chest pain at rest   . Erosive esophagitis   . Vitamin B12 deficiency   . Bowel obstruction 05/13/2012    Recurrent  . Hx of Clostridium difficile infection 01/2012  . 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 Bronchitis     INTERIM HISTORY: has Adenocarcinoma of colon with mucinous features; Bleeding gastrointestinal; Supratherapeutic INR; Abdominal  pain, other specified site; Nausea and vomiting in adult; Anemia, chronic disease; Fever chills; S/P partial gastrectomy; Personal history of PE (pulmonary embolism); Bronchitis, acute; Erosive esophagitis; Esophageal reflux disease; Coffee ground emesis; Hematemesis; Chronic abdominal pain; Pancytopenia due to antineoplastic chemotherapy; Pneumonia; Pulmonary embolism; Chest pain at rest; Small bowel obstruction; Left sided abdominal pain; GI bleed; Coagulopathy; Gastroenteritis; Partial small bowel obstruction; Nausea; Abdominal distension; HTN (hypertension); Diarrhea; Cellulitis; Esophageal dysphagia; H/O Clostridium difficile infection; Rectal bleeding; Bowel obstruction; and Abnormal CT scan, colon on his problem list.   Stage III-a colon carcinoma, status post left colon resection on 07/31/2010 followed by FOLFOX chemotherapy completing 7 of a planned 12 cycles given from 10/12/2010 until 01/04/2011. Last colonoscopy was 07/24/2012 at which time a tubular adenoma was found with no evidence of malignancy. Small bowel obstruction, partial, treated conservatively in [place], [place] in January 2015.     ALLERGIES:  has No Known Allergies.  MEDICATIONS: has a current medication list which includes the following prescription(s): albuterol, alendronate, atorvastatin, butalbital-acetaminophen-caffeine, cholecalciferol, cyanocobalamin, cyclobenzaprine, dexlansoprazole, dicyclomine, diphenoxylate-atropine, docusate sodium, enoxaparin, ensure plus, ferrous [id number] c-folic acid, folic acid, guaifenesin, hydrochlorothiazide, lorazepam, ondansetron, polyethylene glycol powder, prochlorperazine, psyllium, sertraline, sucralfate, thiamine, tramadol, valsartan-hydrochlorothiazide, vitamin d (ergocalciferol), wheat dextrin, and metoclopramide, and the  following Facility-Administered Medications: sodium chloride.  SURGICAL HISTORY:  Past Surgical History  Procedure Laterality Date  .  Hernia repair      right inguinal  . Appendectomy  1980s  . Cholecystectomy  1980s  . Portacath placement    . Abdominal sugery      for bowel obstruction x 8, all in 1980s, except for one in 07/2010  . Esophagogastroduodenoscopy  09/28/2010  . Esophagogastroduodenoscopy  12/01/2010    Cervical web status post dilation, erosive esophagitis, B1 hemigastrectomy, inflamed anastomosis  . Colonoscopy  03/18/2011    anastomosis at 35cm. Several adenomatous polyps removed. Sigmoid diverticulosis. Next TCS 02/2013  . Esophagogastroduodenoscopy  04/16/2011    excoriation at GEJ c/w trauma/M-W tear, friable gastric anastomosis, dilation efferent limb  . Billroth 1 hemigastrectomy  1980s    per patient for benign duodenal tumor  . Esophagogastroduodenoscopy (egd) with esophageal dilation  02/25/2012    [id number] esophageal web-s/p dilation anddisruption as described above. Status post prior gastric with Billroth I configuration. Abnormal gastric mucosa at the anastomosis. Gastric biopsy showed mild chronic inflammation but no H. pylori   . Colonoscopy N/A 07/24/2012    UTM:[id number] post segmental resection with normal-appearing colonic anastomosis aside from an adjacent polyp-removed as described above. Rectal polyp-removed as described above. CT findings appear to have been artifactual. tubular adenomas/prolapsed type polyp.  . Colon surgery  May 2012    left hemicolectomy, colon cancer found at time of surgery for bowel obstruction  . Cardiac catheterization  07/17/2012    FAMILY HISTORY: family history includes Arthritis in his mother; Heart attack in his father; Hypertension in his father and mother; Pneumonia in his mother.  SOCIAL HISTORY:  reports that he has been smoking Cigarettes.  He has a 20 pack-year smoking history. He has never used smokeless tobacco. He  reports that he does not drink alcohol or use illicit drugs.  REVIEW OF SYSTEMS:  Other than that discussed above is noncontributory.  PHYSICAL EXAMINATION: ECOG PERFORMANCE STATUS: 1 - Symptomatic but completely ambulatory  Blood pressure 155/100, pulse 96, temperature 98.8 F (37.1 C), temperature source Oral, resp. rate 20, weight 144 lb 6.4 oz (65.499 kg), SpO2 97.00%.  GENERAL:alert, no distress and comfortable SKIN: skin color, texture, turgor are normal, no rashes or significant lesions EYES: PERLA; Conjunctiva are pink and non-injected, sclera clear SINUSES: No redness or tenderness over maxillary or ethmoid sinuses OROPHARYNX:no exudate, no erythema on lips, buccal mucosa, or tongue. NECK: supple, thyroid normal size, non-tender, without nodularity. No masses CHEST: Normal AP diameter with no breast masses. LYMPH:  no palpable lymphadenopathy in the cervical, axillary or inguinal LUNGS: clear to auscultation and percussion with normal breathing effort HEART: regular rate & rhythm and no murmurs. ABDOMEN:abdomen soft, non-tender and normal bowel sounds. Flat without rebound tenderness or guarding. MUSCULOSKELETAL:no cyanosis of digits and no clubbing. Range of motion normal.  NEURO: alert & oriented x 3 with fluent speech, no focal motor/sensory deficits   LABORATORY DATA: No visits with results within 30 Day(s) from this visit. Latest known visit with results is:  Appointment on 05/31/2013  Component Date Value Ref Range Status  . WBC 05/31/2013 9.3  4.0 - 10.5 K/uL Final  . RBC 05/31/2013 5.01  4.22 - 5.81 MIL/uL Final  . Hemoglobin 05/31/2013 15.5  13.0 - 17.0 g/dL Final  . HCT 05/31/2013 44.7  39.0 - 52.0 % Final  . MCV 05/31/2013 89.2  78.0 - 100.0 fL Final  . MCH 05/31/2013 30.9  26.0 - 34.0 pg Final  . MCHC 05/31/2013 34.7  30.0 - 36.0 g/dL Final  . RDW 05/31/2013 14.5  11.5 - 15.5 % Final  . Platelets 05/31/2013 209  150 - 400 K/uL Final  . Neutrophils Relative  % 05/31/2013 62  43 - 77 % Final  . Neutro Abs 05/31/2013 5.8  1.7 - 7.7 K/uL Final  . Lymphocytes Relative 05/31/2013 27  12 - 46 % Final  . Lymphs Abs 05/31/2013 2.5  0.7 - 4.0 K/uL Final  . Monocytes Relative 05/31/2013 10  3 - 12 % Final  . Monocytes Absolute 05/31/2013 1.0  0.1 - 1.0 K/uL Final  . Eosinophils Relative 05/31/2013 1  0 - 5 % Final  . Eosinophils Absolute 05/31/2013 0.1  0.0 - 0.7 K/uL Final  . Basophils Relative 05/31/2013 0  0 - 1 % Final  . Basophils Absolute 05/31/2013 0.0  0.0 - 0.1 K/uL Final  . Sodium 05/31/2013 139  137 - 147 mEq/L Final  . Potassium 05/31/2013 3.9  3.7 - 5.3 mEq/L Final  . Chloride 05/31/2013 99  96 - 112 mEq/L Final  . CO2 05/31/2013 29  19 - 32 mEq/L Final  . Glucose, Bld 05/31/2013 81  70 - 99 mg/dL Final  . BUN 05/31/2013 11  6 - 23 mg/dL Final  . Creatinine, Ser 05/31/2013 0.86  0.50 - 1.35 mg/dL Final  . Calcium 05/31/2013 9.0  8.4 - 10.5 mg/dL Final  . Total Protein 05/31/2013 7.3  6.0 - 8.3 g/dL Final  . Albumin 05/31/2013 4.0  3.5 - 5.2 g/dL Final  . AST 05/31/2013 20  0 - 37 U/L Final  . ALT 05/31/2013 15  0 - 53 U/L Final  . Alkaline Phosphatase 05/31/2013 78  39 - 117 U/L Final  . Total Bilirubin 05/31/2013 0.2* 0.3 - 1.2 mg/dL Final  . GFR calc non Af Amer 05/31/2013 >90  >90 mL/min Final  . GFR calc Af Amer 05/31/2013 >90  >90 mL/min Final   Comment: (NOTE)                          The eGFR has been calculated using the CKD EPI equation.                          This calculation has not been validated in all clinical situations.                          eGFR's persistently <90 mL/min signify possible Chronic Kidney                          Disease.  . CEA 05/31/2013 1.4  0.0 - 5.0 ng/mL Final   Performed at [facility]: No new pathology.  Urinalysis    Component Value Date/Time   COLORURINE YELLOW 10/07/2012 2048   APPEARANCEUR CLOUDY* 10/07/2012 2048   LABSPEC >1.030* 10/07/2012 2048   PHURINE  5.5 10/07/2012 2048   GLUCOSEU NEGATIVE 10/07/2012 2048   HGBUR NEGATIVE 10/07/2012 2048   BILIRUBINUR MODERATE* 10/07/2012 2048   KETONESUR 15* 10/07/2012 2048   PROTEINUR 100* 10/07/2012 2048   UROBILINOGEN 0.2 10/07/2012 2048   NITRITE NEGATIVE 10/07/2012 2048   LEUKOCYTESUR NEGATIVE 10/07/2012 2048    RADIOGRAPHIC STUDIES: CT scan done in [place], [place] in January 2015 showed no evidence of metastatic disease but with evidence of partial small bowel obstruction..      ASSESSMENT:  #1. Stage III-a left colon  cancer, status post sigmoid resection followed by 7 cycles of FOLFOX producing peripheral neuropathy necessitating discontinuation of treatment with original surgery in 07/31/2010, no evidence of disease on last CT scan done in January 2015.  #2. Intermittent small bowel obstruction, controlled with metoclopramide, status post surgery in July 2014 for lysis of adhesions.  #3. History of pulmonary embolism on 2 occasions, no evidence of recurrence, on long-term anticoagulation with Lovenox.    PLAN:  #1. Patient was told to try Reglan again at 10 mg 4 times a day before meals and at bedtime to help with bowel symptoms. #2. Followup in 3 months with CBC, chem profile, and CEA. #3. Because of his intestinal problems and the doubt about absorption of oral agents, Xarelto will not be offered as an alternative to Lovenox although a discussion was held today regarding its use.   All questions were answered. The patient knows to call the clinic with any problems, questions or concerns. We can certainly see the patient much sooner if necessary.   I spent 25 minutes counseling the patient face to face. The total time spent in the appointment was 30 minutes.    Farrel Gobble, MD 08/23/2013 12:57 PM  DISCLAIMER:  This note was dictated with voice recognition software.  Similar sounding words can inadvertently be transcribed inaccurately and may not be corrected upon review.

## 2013-08-23 NOTE — Progress Notes (Signed)
[name] presented for Portacath access and flush.  Portacath located left chest wall accessed with  H 20 needle. Good blood return present. Portacath flushed with 62ml NS and 500U/35ml Heparin and needle removed intact. Procedure without incident. Patient tolerated procedure well.

## 2013-08-24 LAB — CEA: CEA: 1.5 ng/mL (ref 0.0–5.0)

## 2013-08-30 ENCOUNTER — Ambulatory Visit (HOSPITAL_COMMUNITY): Payer: Federal, State, Local not specified - PPO

## 2013-09-17 ENCOUNTER — Emergency Department (HOSPITAL_COMMUNITY): Payer: Federal, State, Local not specified - PPO

## 2013-09-17 ENCOUNTER — Inpatient Hospital Stay (HOSPITAL_COMMUNITY)
Admission: EM | Admit: 2013-09-17 | Discharge: 2013-09-23 | DRG: 390 | Disposition: A | Discharge status: Home / Self Care | Payer: Federal, State, Local not specified - PPO | Attending: General Surgery | Admitting: General Surgery

## 2013-09-17 ENCOUNTER — Encounter (HOSPITAL_COMMUNITY): Payer: Self-pay | Admitting: Emergency Medicine

## 2013-09-17 DIAGNOSIS — Z9049 Acquired absence of other specified parts of digestive tract: Secondary | ICD-10-CM

## 2013-09-17 DIAGNOSIS — Z8249 Family history of ischemic heart disease and other diseases of the circulatory system: Secondary | ICD-10-CM

## 2013-09-17 DIAGNOSIS — Z8673 Personal history of transient ischemic attack (TIA), and cerebral infarction without residual deficits: Secondary | ICD-10-CM

## 2013-09-17 DIAGNOSIS — K219 Gastro-esophageal reflux disease without esophagitis: Secondary | ICD-10-CM | POA: Diagnosis present

## 2013-09-17 DIAGNOSIS — G4730 Sleep apnea, unspecified: Secondary | ICD-10-CM | POA: Diagnosis present

## 2013-09-17 DIAGNOSIS — G8929 Other chronic pain: Secondary | ICD-10-CM | POA: Diagnosis present

## 2013-09-17 DIAGNOSIS — M129 Arthropathy, unspecified: Secondary | ICD-10-CM | POA: Diagnosis present

## 2013-09-17 DIAGNOSIS — K56609 Unspecified intestinal obstruction, unspecified as to partial versus complete obstruction: Secondary | ICD-10-CM | POA: Diagnosis present

## 2013-09-17 DIAGNOSIS — F17200 Nicotine dependence, unspecified, uncomplicated: Secondary | ICD-10-CM | POA: Diagnosis present

## 2013-09-17 DIAGNOSIS — K565 Intestinal adhesions [bands], unspecified as to partial versus complete obstruction: Principal | ICD-10-CM | POA: Diagnosis present

## 2013-09-17 DIAGNOSIS — Z79899 Other long term (current) drug therapy: Secondary | ICD-10-CM

## 2013-09-17 DIAGNOSIS — Z86711 Personal history of pulmonary embolism: Secondary | ICD-10-CM

## 2013-09-17 DIAGNOSIS — M810 Age-related osteoporosis without current pathological fracture: Secondary | ICD-10-CM | POA: Diagnosis present

## 2013-09-17 DIAGNOSIS — Z85038 Personal history of other malignant neoplasm of large intestine: Secondary | ICD-10-CM

## 2013-09-17 DIAGNOSIS — I10 Essential (primary) hypertension: Secondary | ICD-10-CM | POA: Diagnosis present

## 2013-09-17 DIAGNOSIS — Z903 Acquired absence of stomach [part of]: Secondary | ICD-10-CM

## 2013-09-17 LAB — URINALYSIS, ROUTINE W REFLEX MICROSCOPIC
Bilirubin Urine: NEGATIVE
Glucose, UA: 100 mg/dL — AB
Hgb urine dipstick: NEGATIVE
Ketones, ur: NEGATIVE mg/dL
LEUKOCYTES UA: NEGATIVE
Nitrite: NEGATIVE
PH: 5.5 (ref 5.0–8.0)
Protein, ur: NEGATIVE mg/dL
Specific Gravity, Urine: <1.005 — ABNORMAL LOW (ref 1.005–1.030)
Urobilinogen, UA: 0.2 mg/dL (ref 0.0–1.0)

## 2013-09-17 LAB — COMPREHENSIVE METABOLIC PANEL
ALBUMIN: 3.7 g/dL (ref 3.5–5.2)
ALT: 73 U/L — ABNORMAL HIGH (ref 0–53)
AST: 162 U/L — AB (ref 0–37)
Alkaline Phosphatase: 87 U/L (ref 39–117)
BUN: 7 mg/dL (ref 6–23)
CALCIUM: 8.8 mg/dL (ref 8.4–10.5)
CHLORIDE: 98 meq/L (ref 96–112)
CO2: 27 mEq/L (ref 19–32)
Creatinine, Ser: 0.98 mg/dL (ref 0.50–1.35)
GFR calc Af Amer: >90 mL/min (ref >90)
GFR calc non Af Amer: 86 mL/min — ABNORMAL LOW (ref >90)
Glucose, Bld: 66 mg/dL — ABNORMAL LOW (ref 70–99)
Potassium: 3.7 mEq/L (ref 3.7–5.3)
SODIUM: 138 meq/L (ref 137–147)
Total Bilirubin: 0.2 mg/dL — ABNORMAL LOW (ref 0.3–1.2)
Total Protein: 6.4 g/dL (ref 6.0–8.3)

## 2013-09-17 LAB — CBC WITH DIFFERENTIAL/PLATELET
BASOS ABS: 0 10*3/uL (ref 0.0–0.1)
BASOS PCT: 0 % (ref 0–1)
EOS PCT: 1 % (ref 0–5)
Eosinophils Absolute: 0.1 10*3/uL (ref 0.0–0.7)
HEMATOCRIT: 43.5 % (ref 39.0–52.0)
Hemoglobin: 15.1 g/dL (ref 13.0–17.0)
Lymphocytes Relative: 28 % (ref 12–46)
Lymphs Abs: 2.7 10*3/uL (ref 0.7–4.0)
MCH: 31.3 pg (ref 26.0–34.0)
MCHC: 34.7 g/dL (ref 30.0–36.0)
MCV: 90.1 fL (ref 78.0–100.0)
MONO ABS: 0.9 10*3/uL (ref 0.1–1.0)
Monocytes Relative: 10 % (ref 3–12)
NEUTROS ABS: 5.8 10*3/uL (ref 1.7–7.7)
Neutrophils Relative %: 61 % (ref 43–77)
PLATELETS: 170 10*3/uL (ref 150–400)
RBC: 4.83 MIL/uL (ref 4.22–5.81)
RDW: 13.4 % (ref 11.5–15.5)
WBC: 9.5 10*3/uL (ref 4.0–10.5)

## 2013-09-17 LAB — LIPASE, BLOOD: Lipase: 18 U/L (ref 11–59)

## 2013-09-17 MED ORDER — IOHEXOL 300 MG/ML  SOLN
100.0000 mL | Freq: Once | INTRAMUSCULAR | Status: AC | PRN
  Administered 2013-09-17: 100 mL via INTRAVENOUS

## 2013-09-17 MED ORDER — HYDROMORPHONE HCL PF 1 MG/ML IJ SOLN
1.0000 mg | Freq: Once | INTRAMUSCULAR | Status: AC
  Administered 2013-09-17: 1 mg via INTRAVENOUS
  Filled 2013-09-17: qty 1

## 2013-09-17 MED ORDER — SODIUM CHLORIDE 0.9 % IV BOLUS (SEPSIS)
1000.0000 mL | Freq: Once | INTRAVENOUS | Status: AC
  Administered 2013-09-17: 1000 mL via INTRAVENOUS

## 2013-09-17 MED ORDER — ONDANSETRON HCL 4 MG/2ML IJ SOLN
4.0000 mg | Freq: Once | INTRAMUSCULAR | Status: AC
  Administered 2013-09-17: 4 mg via INTRAVENOUS
  Filled 2013-09-17: qty 2

## 2013-09-17 MED ORDER — IOHEXOL 300 MG/ML  SOLN
50.0000 mL | Freq: Once | INTRAMUSCULAR | Status: AC | PRN
  Administered 2013-09-17: 50 mL via ORAL

## 2013-09-17 NOTE — H&P (Signed)
[name] is an 63 y.o. male.   Chief Complaint: Abdominal pain and nausea HPI: Patient is a 63 year old white male with multiple medical problems including bowel dysfunction, multiple admissions for ileus, adenocarcinoma of the colon, status post partial colectomy with multiple abdominal surgeries in the remote past who last was in the hospital in July 2014 with a partial small bowel obstruction. This did resolve without surgical intervention. It is felt that surgery in this patient would be very risky given his multiple surgeries in the past. He presents with a 24-hour history of worsening abdominal pain and nausea. His last bowel movement was yesterday. This is similar to previous episodes. They all resolve with NG tube decompression.  Past Medical History  Diagnosis Date  . Pulmonary embolism 02/2010  . Hypertension   . Osteoporosis   . Arthritis   . TIA (transient ischemic attack) 10/11  . ETOH abuse     quit 03/2010  . 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 Personal history of PE (pulmonary embolism) 10/01/2010  . Blood transfusion   . S/P endoscopy September 28, 2010    erosive reflux esophagitis, Billroth I anatomy  . Shortness of breath   . Sleep apnea   . Recurrent upper respiratory infection (URI)   . Anxiety   . Pneumonia   . Anemia   . Ileus   . Chronic abdominal pain   . GERD (gastroesophageal reflux disease)   . Chest pain at rest   . Erosive esophagitis   . Vitamin B12 deficiency   . Bowel obstruction 05/13/2012    Recurrent  . Hx of Clostridium difficile infection 01/2012  . Bronchitis   . 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 Hernia repair      right inguinal  . Appendectomy  1980s  . Cholecystectomy  1980s  . Portacath placement    . Abdominal sugery      for bowel obstruction x 8, all in 1980s, except for one in 07/2010  . Esophagogastroduodenoscopy  09/28/2010  .  Esophagogastroduodenoscopy  12/01/2010    Cervical web status post dilation, erosive esophagitis, B1 hemigastrectomy, inflamed anastomosis  . Colonoscopy  03/18/2011    anastomosis at 35cm. Several adenomatous polyps removed. Sigmoid diverticulosis. Next TCS 02/2013  . Esophagogastroduodenoscopy  04/16/2011    excoriation at GEJ c/w trauma/M-W tear, friable gastric anastomosis, dilation efferent limb  . Billroth 1 hemigastrectomy  1980s    per patient for benign duodenal tumor  . Esophagogastroduodenoscopy (egd) with esophageal dilation  02/25/2012    TKZ:[id number] esophageal web-s/p dilation anddisruption as described above. Status post prior gastric with Billroth I configuration. Abnormal gastric mucosa at the anastomosis. Gastric biopsy showed mild chronic inflammation but no H. pylori   . Colonoscopy N/A 07/24/2012    FTD:DUKGUR post segmental resection with normal-appearing colonic anastomosis aside from an adjacent polyp-removed as described above. Rectal polyp-removed as described above. CT findings appear to have been artifactual. tubular adenomas/prolapsed type polyp.  . Colon surgery  May 2012    left hemicolectomy, colon cancer found at time of surgery for bowel obstruction  . Cardiac catheterization  07/17/2012    Family History  Problem Relation Age of Onset  . Hypertension Mother   . Arthritis Mother   . Pneumonia Mother   . Hypertension Father   . Heart attack Father    Social History:  reports that he has been smoking  Cigarettes.  He has a 20 pack-year smoking history. He has never used smokeless tobacco. He reports that he does not drink alcohol or use illicit drugs.  Allergies: No Known Allergies   (Not in a hospital admission)  Results for orders placed during the hospital encounter of 09/17/13 (from the past 48 hour(s))  URINALYSIS, ROUTINE W REFLEX MICROSCOPIC     Status: Abnormal   Collection Time    09/17/13  5:45 PM      Result Value Ref Range   Color, Urine  YELLOW  YELLOW   APPearance CLEAR  CLEAR   Specific Gravity, Urine <1.005 (*) 1.005 - 1.030   pH 5.5  5.0 - 8.0   Glucose, UA 100 (*) NEGATIVE mg/dL   Hgb urine dipstick NEGATIVE  NEGATIVE   Bilirubin Urine NEGATIVE  NEGATIVE   Ketones, ur NEGATIVE  NEGATIVE mg/dL   Protein, ur NEGATIVE  NEGATIVE mg/dL   Urobilinogen, UA 0.2  0.0 - 1.0 mg/dL   Nitrite NEGATIVE  NEGATIVE   Leukocytes, UA NEGATIVE  NEGATIVE   Comment: MICROSCOPIC NOT DONE ON URINES WITH NEGATIVE PROTEIN, BLOOD, LEUKOCYTES, NITRITE, OR GLUCOSE <1000 mg/dL.  CBC WITH DIFFERENTIAL     Status: None   Collection Time    09/17/13  6:30 PM      Result Value Ref Range   WBC 9.5  4.0 - 10.5 K/uL   RBC 4.83  4.22 - 5.81 MIL/uL   Hemoglobin 15.1  13.0 - 17.0 g/dL   HCT 43.5  39.0 - 52.0 %   MCV 90.1  78.0 - 100.0 fL   MCH 31.3  26.0 - 34.0 pg   MCHC 34.7  30.0 - 36.0 g/dL   RDW 13.4  11.5 - 15.5 %   Platelets 170  150 - 400 K/uL   Neutrophils Relative % 61  43 - 77 %   Neutro Abs 5.8  1.7 - 7.7 K/uL   Lymphocytes Relative 28  12 - 46 %   Lymphs Abs 2.7  0.7 - 4.0 K/uL   Monocytes Relative 10  3 - 12 %   Monocytes Absolute 0.9  0.1 - 1.0 K/uL   Eosinophils Relative 1  0 - 5 %   Eosinophils Absolute 0.1  0.0 - 0.7 K/uL   Basophils Relative 0  0 - 1 %   Basophils Absolute 0.0  0.0 - 0.1 K/uL  COMPREHENSIVE METABOLIC PANEL     Status: Abnormal   Collection Time    09/17/13  6:30 PM      Result Value Ref Range   Sodium 138  137 - 147 mEq/L   Potassium 3.7  3.7 - 5.3 mEq/L   Chloride 98  96 - 112 mEq/L   CO2 27  19 - 32 mEq/L   Glucose, Bld 66 (*) 70 - 99 mg/dL   BUN 7  6 - 23 mg/dL   Creatinine, Ser 0.98  0.50 - 1.35 mg/dL   Calcium 8.8  8.4 - 10.5 mg/dL   Total Protein 6.4  6.0 - 8.3 g/dL   Albumin 3.7  3.5 - 5.2 g/dL   AST 162 (*) 0 - 37 U/L   ALT 73 (*) 0 - 53 U/L   Alkaline Phosphatase 87  39 - 117 U/L   Total Bilirubin 0.2 (*) 0.3 - 1.2 mg/dL   GFR calc non Af Amer 86 (*) >90 mL/min   GFR calc Af Amer >90   >90 mL/min   Comment: (NOTE)  The eGFR has been calculated using the CKD EPI equation.     This calculation has not been validated in all clinical situations.     eGFR's persistently <90 mL/min signify possible Chronic Kidney     Disease.  LIPASE, BLOOD     Status: None   Collection Time    09/17/13  6:30 PM      Result Value Ref Range   Lipase 18  11 - 59 U/L   Ct Abdomen Pelvis W Contrast  09/17/2013   CLINICAL DATA:  Abdomen pain for 2 days. History of colon cancer 3 years ago.  EXAM: CT ABDOMEN AND PELVIS WITH CONTRAST  TECHNIQUE: Multidetector CT imaging of the abdomen and pelvis was performed using the standard protocol following bolus administration of intravenous contrast.  CONTRAST:  61m OMNIPAQUE IOHEXOL 300 MG/ML SOLN, 1041mOMNIPAQUE IOHEXOL 300 MG/ML SOLN  COMPARISON:  September 29, 2012  FINDINGS: No focal lesions identified within the liver. Patient is status post prior cholecystectomy with mild intra and extrahepatic biliary ductal dilatation. The common bile duct measures 11.7 mm postsurgical change. The spleen, pancreas, adrenal glands are normal. There are simple cysts within the right kidney unchanged. The left parapelvic renal cyst is identified unchanged. There is atherosclerosis of the abdominal aorta without aneurysmal dilatation.  Stable postoperative changes are identified in the stomach. There are dilated small bowel loops in the abdomen and pelvis. There is question bowel wall thickening of ileal small bowel loops. Postsurgical changes of the colon are identified. Moderate bowel content is identified in the rectum. There is mild atelectasis and scar of the posterior lung bases. No acute abnormalities identified within the visualized bones.  IMPRESSION: Small bowel obstruction. Question ileal bowel wall thickening from possible inflammatory bowel disease or stricture.   Electronically Signed   By: WeAbelardo Diesel.D.   On: 09/17/2013 20:29    Review of Systems   Constitutional: Positive for malaise/fatigue.  HENT: Negative.   Eyes: Negative.   Cardiovascular: Negative.   Gastrointestinal: Positive for nausea and abdominal pain.  Genitourinary: Negative.   Musculoskeletal: Negative.   Skin: Negative.     Blood pressure 130/82, pulse 77, temperature 97.6 F (36.4 C), temperature source Oral, resp. rate 18, height '5\' 9"'  (1.753 m), weight 63.504 kg (140 lb), SpO2 97.00%. Physical Exam  Vitals reviewed. Constitutional: He is oriented to person, place, and time. He appears well-developed and well-nourished.  HENT:  Head: Normocephalic and atraumatic.  Neck: Normal range of motion. Neck supple.  Cardiovascular: Normal rate, regular rhythm and normal heart sounds.   Respiratory: Effort normal and breath sounds normal.  GI: Soft. He exhibits no distension. There is tenderness. There is no rebound.  Neurological: He is alert and oriented to person, place, and time.  Skin: Skin is warm and dry.     Assessment/Plan Impression: Small bowel obstruction, partial versus ileus secondary to the dysfunctional bowel.. no need for acute surgical intervention at this time. The patient will be admitted to the hospital for IV hydration, NG tube decompression, and pain control. He understands and agrees to the treatment plan.  JENKINS,MARK A 09/17/2013, 11:33 PM

## 2013-09-17 NOTE — ED Provider Notes (Signed)
CSN: [id number]     Arrival date & time 09/17/13  1610 History  This chart was scribed for [name] So, MD,  by Stacy Gardner, ED Scribe. The patient was seen in room APA11/APA11 and the patient's care was started at 8:39 PM.   First MD Initiated Contact with Patient 09/17/13 1747     Chief Complaint  Patient presents with  . Abdominal Pain     (Consider location/radiation/quality/duration/timing/severity/associated sxs/prior Treatment) Patient is a 63 y.o. male presenting with abdominal pain. The history is provided by the patient and medical records. No language interpreter was used.  Abdominal Pain Pain location:  RUQ and LUQ Pain radiates to:  Does not radiate Pain severity:  Moderate Onset quality:  Sudden Duration:  2 days Timing:  Constant Progression:  Unchanged Chronicity:  New Relieved by:  Nothing Worsened by:  Nothing tried Ineffective treatments:  None tried Associated symptoms: diarrhea and nausea   Associated symptoms: no vomiting    HPI Comments: [name] is a 63 y.o. male who presents to the Emergency Department complaining of constant, moderate, upper abdominal pain for the past two days. Pt was advised to come by Dr. Sherryl Barters, MD in [place] on concern of SOB. Pt nausea and diarrhea. He took Tramadol and his last dose was last night.  Denies vomiting and BM. He did not eat today but has been drinking fluids. The pain is  7/10 in severity. He has a x-ray and laboratory test results with him from his visit to his MD today. The x-ray results indicated a small bowel obstruction.  He had colon cancer three years ago. He had a colosectomy and radiation. Pt has a current GI physician.  Past Medical History  Diagnosis Date  . Pulmonary embolism 02/2010  . Hypertension   . Osteoporosis   . Arthritis   . TIA (transient ischemic attack) 10/11  . ETOH abuse     quit 03/2010  . 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 Personal history of PE (pulmonary embolism)  10/01/2010  . Blood transfusion   . S/P endoscopy September 28, 2010    erosive reflux esophagitis, Billroth I anatomy  . Shortness of breath   . Sleep apnea   . Recurrent upper respiratory infection (URI)   . Anxiety   . Pneumonia   . Anemia   . Ileus   . Chronic abdominal pain   . GERD (gastroesophageal reflux disease)   . Chest pain at rest   . Erosive esophagitis   . Vitamin B12 deficiency   . Bowel obstruction 05/13/2012    Recurrent  . Hx of Clostridium difficile infection 01/2012  . Bronchitis   . 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 Hernia repair      right inguinal  . Appendectomy  1980s  . Cholecystectomy  1980s  . Portacath placement    . Abdominal sugery      for bowel obstruction x 8, all in 1980s, except for one in 07/2010  . Esophagogastroduodenoscopy  09/28/2010  . Esophagogastroduodenoscopy  12/01/2010    Cervical web status post dilation, erosive esophagitis, B1 hemigastrectomy, inflamed anastomosis  . Colonoscopy  03/18/2011    anastomosis at 35cm. Several adenomatous polyps removed. Sigmoid diverticulosis. Next TCS 02/2013  . Esophagogastroduodenoscopy  04/16/2011    excoriation at GEJ c/w trauma/M-W tear, friable gastric anastomosis, dilation efferent limb  . Billroth 1 hemigastrectomy  1980s  per patient for benign duodenal tumor  . Esophagogastroduodenoscopy (egd) with esophageal dilation  02/25/2012    KZS:WFUXNATF esophageal web-s/p dilation anddisruption as described above. Status post prior gastric with Billroth I configuration. Abnormal gastric mucosa at the anastomosis. Gastric biopsy showed mild chronic inflammation but no H. pylori   . Colonoscopy N/A 07/24/2012    TDD:UKGURK post segmental resection with normal-appearing colonic anastomosis aside from an adjacent polyp-removed as described above. Rectal polyp-removed as described above. CT findings appear to have been  artifactual. tubular adenomas/prolapsed type polyp.  . Colon surgery  May 2012    left hemicolectomy, colon cancer found at time of surgery for bowel obstruction  . Cardiac catheterization  07/17/2012   Family History  Problem Relation Age of Onset  . Hypertension Mother   . Arthritis Mother   . Pneumonia Mother   . Hypertension Father   . Heart attack Father    History  Substance Use Topics  . Smoking status: Current Every Day Smoker -- 0.50 packs/day for 40 years    Types: Cigarettes  . Smokeless tobacco: Never Used  . Alcohol Use: No     Comment: last drink was Apr 16 2010    Review of Systems  Constitutional: Positive for appetite change.  Gastrointestinal: Positive for nausea, abdominal pain and diarrhea. Negative for vomiting.  All other systems reviewed and are negative.     Allergies  Review of patient's allergies indicates no known allergies.  Home Medications   Prior to Admission medications   Medication Sig Start Date End Date Taking? Authorizing Dwight Burdo  alendronate (FOSAMAX) 70 MG tablet Take 70 mg by mouth every 7 (seven) days. On sundays 10/14/11  Yes Historical Razi Hickle, MD  butalbital-acetaminophen-caffeine (FIORICET, ESGIC) 50-325-40 MG per tablet 1 tablet 3 (three) times daily as needed for headache or migraine.  04/10/13  Yes Historical Julena Barbour, MD  cholecalciferol (VITAMIN D) 1000 UNITS tablet Take 1,000 Units by mouth every morning.   Yes Historical Lynde Ludwig, MD  cyclobenzaprine (FLEXERIL) 10 MG tablet Take 10 mg by mouth daily as needed for muscle spasms.  11/18/11  Yes Historical Leticia Coletta, MD  dexlansoprazole (DEXILANT) 60 MG capsule Take 60 mg by mouth every morning. 03/22/11  Yes Mahala Menghini, PA-C  dicyclomine (BENTYL) 20 MG tablet Take 1 tablet by mouth daily as needed for spasms.  04/24/13  Yes Historical Iyahna Obriant, MD  diphenoxylate-atropine (LOMOTIL) 2.5-0.025 MG per tablet Take 1 tablet by mouth 2 (two) times daily.  04/10/13  Yes Historical  Chrisangel Eskenazi, MD  docusate sodium (COLACE) 100 MG capsule Take 100 mg by mouth 2 (two) times daily.   Yes Historical Biviana Saddler, MD  enoxaparin (LOVENOX) 60 MG/0.6ML injection Inject 60 mg into the skin every evening.    Yes Historical Sharnice Bosler, MD  ENSURE PLUS (ENSURE PLUS) LIQD Take 237 mLs by mouth daily.   Yes Historical Ife Vitelli, MD  ferrous [id number] C-folic acid (FEROCON) capsule Take 1 capsule by mouth daily.   Yes Historical Keyonta Barradas, MD  folic acid (FOLVITE) 1 MG tablet Take 1 mg by mouth every morning.    Yes Historical Genavie Boettger, MD  LORazepam (ATIVAN) 1 MG tablet Take 1 mg by mouth every 3 (three) hours as needed. anxiety   Yes Historical Elias Dennington, MD  ondansetron (ZOFRAN) 4 MG tablet Take 4 mg by mouth daily as needed for nausea.   Yes Historical Nikka Hakimian, MD  polyethylene glycol powder (GLYCOLAX/MIRALAX) powder Take 17 g by mouth as needed. For constipation   Yes Historical Apolo Cutshaw, MD  prochlorperazine (COMPAZINE) 10 MG tablet Take 10 mg by mouth every 6 (six) hours as needed. Nausea and Vomiting   Yes Historical Devron Cohick, MD  psyllium (REGULOID) 0.52 G capsule Take 2.6 g by mouth daily. *5 capsules taken twice daily*   Yes Historical Mailyn Steichen, MD  sertraline (ZOLOFT) 100 MG tablet Take 150 mg by mouth at bedtime.    Yes Historical Duchess Armendarez, MD  sucralfate (CARAFATE) 1 GM/10ML suspension Take 10 mLs (1 g total) by mouth 4 (four) times daily. As needed 05/01/13  Yes Mahala Menghini, PA-C  thiamine 100 MG tablet Take 100 mg by mouth every morning.    Yes Historical Lilyian Quayle, MD  traMADol (ULTRAM) 50 MG tablet Take 50 mg by mouth every 6 (six) hours as needed for moderate pain or severe pain.    Yes Historical Jerzee Jerome, MD  valsartan-hydrochlorothiazide (DIOVAN-HCT) 80-12.5 MG per tablet Take 1 tablet by mouth every morning.    Yes Historical Marit Goodwill, MD  Vitamin D, Ergocalciferol, (DRISDOL) 50000 UNITS CAPS Take 50,000 Units by mouth once a week. Takes on Sunday 03/13/12  Yes  Historical Sylvania Moss, MD  Wheat Dextrin (BENEFIBER PO) Take 1 each by mouth daily as needed (to regulate bowel movements/for supplement).    Yes Historical Merrillyn Ackerley, MD  albuterol (PROAIR HFA) 108 (90 BASE) MCG/ACT inhaler Inhale into the lungs every 6 (six) hours as needed for wheezing or shortness of breath.    Historical Julianna Vanwagner, MD  cyanocobalamin (,VITAMIN B-12,) 1000 MCG/ML injection Inject 1,000 mcg into the muscle every 30 (thirty) days.    Historical Sinthia Karabin, MD   BP 130/82  Pulse 77  Temp(Src) 97.6 F (36.4 C) (Oral)  Resp 18  Ht 5\' 9"  (1.753 m)  Wt 140 lb (63.504 kg)  BMI 20.67 kg/m2  SpO2 97% Physical Exam  Nursing note and vitals reviewed. Constitutional: He is oriented to person, place, and time. He appears well-developed and well-nourished. No distress.  HENT:  Head: Normocephalic and atraumatic.  Eyes: Conjunctivae and EOM are normal.  Neck: Neck supple. No tracheal deviation present.  Cardiovascular: Normal rate.   Pulmonary/Chest: Effort normal. No respiratory distress.  Musculoskeletal: Normal range of motion.  Neurological: He is alert and oriented to person, place, and time.  Skin: Skin is warm and dry.  Psychiatric: He has a normal mood and affect. His behavior is normal.    ED Course  Procedures (including critical care time) DIAGNOSTIC STUDIES: Oxygen Saturation is 97% on room air, normal by my interpretation.    COORDINATION OF CARE:  8:42 PM Discussed course of care with pt . Pt understands and agrees.     Labs Review Labs Reviewed  COMPREHENSIVE METABOLIC PANEL - Abnormal; Notable for the following:    Glucose, Bld 66 (*)    AST 162 (*)    ALT 73 (*)    Total Bilirubin 0.2 (*)    GFR calc non Af Amer 86 (*)    All other components within normal limits  URINALYSIS, ROUTINE W REFLEX MICROSCOPIC - Abnormal; Notable for the following:    Specific Gravity, Urine <1.005 (*)    Glucose, UA 100 (*)    All other components within normal limits   CBC WITH DIFFERENTIAL  LIPASE, BLOOD    Imaging Review Ct Abdomen Pelvis W Contrast  09/17/2013   CLINICAL DATA:  Abdomen pain for 2 days. History of colon cancer 3 years ago.  EXAM: CT ABDOMEN AND PELVIS WITH CONTRAST  TECHNIQUE: Multidetector CT imaging of the abdomen and pelvis  was performed using the standard protocol following bolus administration of intravenous contrast.  CONTRAST:  74mL OMNIPAQUE IOHEXOL 300 MG/ML SOLN, 145mL OMNIPAQUE IOHEXOL 300 MG/ML SOLN  COMPARISON:  September 29, 2012  FINDINGS: No focal lesions identified within the liver. Patient is status post prior cholecystectomy with mild intra and extrahepatic biliary ductal dilatation. The common bile duct measures 11.7 mm postsurgical change. The spleen, pancreas, adrenal glands are normal. There are simple cysts within the right kidney unchanged. The left parapelvic renal cyst is identified unchanged. There is atherosclerosis of the abdominal aorta without aneurysmal dilatation.  Stable postoperative changes are identified in the stomach. There are dilated small bowel loops in the abdomen and pelvis. There is question bowel wall thickening of ileal small bowel loops. Postsurgical changes of the colon are identified. Moderate bowel content is identified in the rectum. There is mild atelectasis and scar of the posterior lung bases. No acute abnormalities identified within the visualized bones.  IMPRESSION: Small bowel obstruction. Question ileal bowel wall thickening from possible inflammatory bowel disease or stricture.   Electronically Signed   By: Abelardo Diesel M.D.   On: 09/17/2013 20:29     EKG Interpretation None      MDM   Final diagnoses:  SBO (small bowel obstruction)    Discussed with Dr. Arnoldo Morale and he will be in to admit.   I personally performed the services described in this documentation, which was scribed in my presence. The recorded information has been reviewed and considered.    Shaune Pollack,  MD 09/17/13 [phone]

## 2013-09-17 NOTE — ED Notes (Addendum)
Sent from MD office in [place], says he has   SBO . Nausea, and diarrhea  X ray , CBC and Bmet  Reports with pt

## 2013-09-17 NOTE — ED Notes (Signed)
Fluids set at [facility]

## 2013-09-18 ENCOUNTER — Encounter (HOSPITAL_COMMUNITY): Payer: Self-pay

## 2013-09-18 LAB — BASIC METABOLIC PANEL
BUN: 6 mg/dL (ref 6–23)
CALCIUM: 7.8 mg/dL — AB (ref 8.4–10.5)
CO2: 28 mEq/L (ref 19–32)
Chloride: 102 mEq/L (ref 96–112)
Creatinine, Ser: 0.92 mg/dL (ref 0.50–1.35)
GFR calc Af Amer: >90 mL/min (ref >90)
GFR, EST NON AFRICAN AMERICAN: 89 mL/min — AB (ref >90)
Glucose, Bld: 90 mg/dL (ref 70–99)
Potassium: 4.3 mEq/L (ref 3.7–5.3)
Sodium: 140 mEq/L (ref 137–147)

## 2013-09-18 LAB — CBC
HEMATOCRIT: 40.1 % (ref 39.0–52.0)
Hemoglobin: 13.6 g/dL (ref 13.0–17.0)
MCH: 30.7 pg (ref 26.0–34.0)
MCHC: 33.9 g/dL (ref 30.0–36.0)
MCV: 90.5 fL (ref 78.0–100.0)
Platelets: 156 10*3/uL (ref 150–400)
RBC: 4.43 MIL/uL (ref 4.22–5.81)
RDW: 13.6 % (ref 11.5–15.5)
WBC: 8.3 10*3/uL (ref 4.0–10.5)

## 2013-09-18 LAB — PHOSPHORUS: PHOSPHORUS: 2.9 mg/dL (ref 2.3–4.6)

## 2013-09-18 LAB — MAGNESIUM: Magnesium: 1.7 mg/dL (ref 1.5–2.5)

## 2013-09-18 MED ORDER — DIPHENHYDRAMINE HCL 50 MG/ML IJ SOLN: 12.5000 mg | Freq: Four times a day (QID) | INTRAMUSCULAR | Status: DC | PRN

## 2013-09-18 MED ORDER — ENOXAPARIN SODIUM 40 MG/0.4ML ~~LOC~~ SOLN
40.0000 mg | SUBCUTANEOUS | Status: DC
  Administered 2013-09-18 – 2013-09-23 (×6): 40 mg via SUBCUTANEOUS
  Filled 2013-09-18 (×6): qty 0.4

## 2013-09-18 MED ORDER — ONDANSETRON HCL 4 MG/2ML IJ SOLN
4.0000 mg | Freq: Four times a day (QID) | INTRAMUSCULAR | Status: DC | PRN
  Administered 2013-09-18 – 2013-09-23 (×14): 4 mg via INTRAVENOUS
  Filled 2013-09-18 (×15): qty 2

## 2013-09-18 MED ORDER — HYDROCHLOROTHIAZIDE 12.5 MG PO CAPS
12.5000 mg | ORAL_CAPSULE | Freq: Every day | ORAL | Status: DC
  Administered 2013-09-18 – 2013-09-23 (×6): 12.5 mg via ORAL
  Filled 2013-09-18 (×6): qty 1

## 2013-09-18 MED ORDER — ACETAMINOPHEN 325 MG PO TABS: 650.0000 mg | ORAL_TABLET | Freq: Four times a day (QID) | ORAL | Status: DC | PRN

## 2013-09-18 MED ORDER — LACTATED RINGERS IV SOLN
INTRAVENOUS | Status: DC
  Administered 2013-09-18 – 2013-09-20 (×5): via INTRAVENOUS

## 2013-09-18 MED ORDER — PANTOPRAZOLE SODIUM 40 MG IV SOLR
40.0000 mg | Freq: Every day | INTRAVENOUS | Status: DC
  Administered 2013-09-18 – 2013-09-19 (×3): 40 mg via INTRAVENOUS
  Filled 2013-09-18 (×2): qty 40

## 2013-09-18 MED ORDER — LORAZEPAM 2 MG/ML IJ SOLN: 1.0000 mg | INTRAMUSCULAR | Status: DC | PRN

## 2013-09-18 MED ORDER — DIPHENHYDRAMINE HCL 12.5 MG/5ML PO ELIX: 12.5000 mg | ORAL_SOLUTION | Freq: Four times a day (QID) | ORAL | Status: DC | PRN

## 2013-09-18 MED ORDER — ALBUTEROL SULFATE (2.5 MG/3ML) 0.083% IN NEBU: 3.0000 mL | INHALATION_SOLUTION | Freq: Four times a day (QID) | RESPIRATORY_TRACT | Status: DC | PRN

## 2013-09-18 MED ORDER — HYDROMORPHONE HCL PF 1 MG/ML IJ SOLN
1.0000 mg | INTRAMUSCULAR | Status: DC | PRN
  Administered 2013-09-18 – 2013-09-23 (×43): 1 mg via INTRAVENOUS
  Filled 2013-09-18 (×44): qty 1

## 2013-09-18 MED ORDER — VALSARTAN-HYDROCHLOROTHIAZIDE 80-12.5 MG PO TABS: 1.0000 | ORAL_TABLET | Freq: Every morning | ORAL | Status: DC

## 2013-09-18 MED ORDER — ACETAMINOPHEN 650 MG RE SUPP: 650.0000 mg | Freq: Four times a day (QID) | RECTAL | Status: DC | PRN

## 2013-09-18 MED ORDER — IRBESARTAN 75 MG PO TABS
75.0000 mg | ORAL_TABLET | Freq: Every day | ORAL | Status: DC
  Administered 2013-09-18 – 2013-09-23 (×6): 75 mg via ORAL
  Filled 2013-09-18 (×6): qty 1

## 2013-09-18 NOTE — Care Management Note (Signed)
    Page 1 of 1   09/18/2013     11:27:38 AM CARE MANAGEMENT NOTE 09/18/2013  Patient:  [name], [name]   Account Number:  [id number]  Date Initiated:  09/18/2013  Documentation initiated by:  Theophilus Kinds  Subjective/Objective Assessment:   Pt admitted from home with SBO. Pt lives with his wife and will return home at discharge. Pt is independent with ADL's. Pts PCP is Dr. Monia Pouch in [place].     Action/Plan:   No CM needs noted.   Anticipated DC Date:  09/24/2013   Anticipated DC Plan:  [place]  CM consult      Choice offered to / List presented to:             Status of service:  Completed, signed off Medicare Important Message given?   (If response is "NO", the following Medicare IM given date fields will be blank) Date Medicare IM given:   Medicare IM given by:   Date Additional Medicare IM given:   Additional Medicare IM given by:    Discharge Disposition:  HOME/SELF CARE  Per UR Regulation:    If discussed at Long Length of Stay Meetings, dates discussed:    Comments:  09/18/13 [place], RN BSN CM

## 2013-09-18 NOTE — Progress Notes (Signed)
  Subjective: No change on clinical examination. Mild nonspecific abdominal discomfort. No flatus or bowel movement yet.  Objective: Vital signs in last 24 hours: Temp:  [97.6 F (36.4 C)-98 F (36.7 C)] 97.9 F (36.6 C) (06/30 1510) Pulse Rate:  [62-100] 62 (06/30 1510) Resp:  [16-20] 18 (06/30 1510) BP: (127-168)/(75-108) 127/83 mmHg (06/30 1510) SpO2:  [95 %-98 %] 95 % (06/30 1510) Weight:  [63.504 kg (140 lb)] 63.504 kg (140 lb) (06/30 0103) Last BM Date: 09/16/13  Intake/Output from previous day: 06/29 0701 - 06/30 0700 In: -  Out: 400 [Urine:300; Emesis/NG output:100] Intake/Output this shift: Total I/O In: 0  Out: 250 [Urine:250]  General appearance: alert, cooperative and no distress Resp: clear to auscultation bilaterally Cardio: regular rate and rhythm, S1, S2 normal, no murmur, click, rub or gallop GI: Soft, mildly distended. No rigidity noted. No specific tenderness noted. No hernias are noted.  Lab Results:   Recent Labs  09/17/13 1830 09/18/13 0532  WBC 9.5 8.3  HGB 15.1 13.6  HCT 43.5 40.1  PLT 170 156   BMET  Recent Labs  09/17/13 1830 09/18/13 0532  NA 138 140  K 3.7 4.3  CL 98 102  CO2 27 28  GLUCOSE 66* 90  BUN 7 6  CREATININE 0.98 0.92  CALCIUM 8.8 7.8*   PT/INR No results found for this basename: LABPROT, INR,  in the last 72 hours  Studies/Results: Ct Abdomen Pelvis W Contrast  09/17/2013   CLINICAL DATA:  Abdomen pain for 2 days. History of colon cancer 3 years ago.  EXAM: CT ABDOMEN AND PELVIS WITH CONTRAST  TECHNIQUE: Multidetector CT imaging of the abdomen and pelvis was performed using the standard protocol following bolus administration of intravenous contrast.  CONTRAST:  22mL OMNIPAQUE IOHEXOL 300 MG/ML SOLN, 146mL OMNIPAQUE IOHEXOL 300 MG/ML SOLN  COMPARISON:  September 29, 2012  FINDINGS: No focal lesions identified within the liver. Patient is status post prior cholecystectomy with mild intra and extrahepatic biliary ductal  dilatation. The common bile duct measures 11.7 mm postsurgical change. The spleen, pancreas, adrenal glands are normal. There are simple cysts within the right kidney unchanged. The left parapelvic renal cyst is identified unchanged. There is atherosclerosis of the abdominal aorta without aneurysmal dilatation.  Stable postoperative changes are identified in the stomach. There are dilated small bowel loops in the abdomen and pelvis. There is question bowel wall thickening of ileal small bowel loops. Postsurgical changes of the colon are identified. Moderate bowel content is identified in the rectum. There is mild atelectasis and scar of the posterior lung bases. No acute abnormalities identified within the visualized bones.  IMPRESSION: Small bowel obstruction. Question ileal bowel wall thickening from possible inflammatory bowel disease or stricture.   Electronically Signed   By: Abelardo Diesel M.D.   On: 09/17/2013 20:29    Anti-infectives: Anti-infectives   None      Assessment/Plan: Impression: Dysfunctional bowel, abdominal discomfort, unchanged from admission. It has not worsened. The patient appears comfortable. We'll continue current management.  LOS: 1 day    JENKINS,MARK A 09/18/2013

## 2013-09-18 NOTE — Progress Notes (Signed)
UR chart review completed.  

## 2013-09-19 MED ORDER — BISACODYL 10 MG RE SUPP
10.0000 mg | Freq: Two times a day (BID) | RECTAL | Status: DC
  Administered 2013-09-19 – 2013-09-21 (×4): 10 mg via RECTAL
  Filled 2013-09-19 (×5): qty 1

## 2013-09-19 NOTE — Progress Notes (Signed)
  Subjective: No significant change from yesterday. Patient would like to keep NG tube in place for now.  Objective: Vital signs in last 24 hours: Temp:  [97.9 F (36.6 C)-98.6 F (37 C)] 98.5 F (36.9 C) (07/01 0618) Pulse Rate:  [62-70] 70 (07/01 0618) Resp:  [18] 18 (07/01 0618) BP: (127-137)/(73-87) 132/79 mmHg (07/01 0618) SpO2:  [94 %-96 %] 96 % (07/01 0618) Last BM Date: 09/16/13  Intake/Output from previous day: 06/30 0701 - 07/01 0700 In: 0  Out: 2450 [Urine:1950; Emesis/NG output:500] Intake/Output this shift: Total I/O In: -  Out: 300 [Urine:300]  General appearance: alert, cooperative and no distress Resp: clear to auscultation bilaterally Cardio: regular rate and rhythm, S1, S2 normal, no murmur, click, rub or gallop GI: Soft, not particularly distended. No rigidity noted. Occasional bowel sounds appreciated.  Lab Results:   Recent Labs  09/17/13 1830 09/18/13 0532  WBC 9.5 8.3  HGB 15.1 13.6  HCT 43.5 40.1  PLT 170 156   BMET  Recent Labs  09/17/13 1830 09/18/13 0532  NA 138 140  K 3.7 4.3  CL 98 102  CO2 27 28  GLUCOSE 66* 90  BUN 7 6  CREATININE 0.98 0.92  CALCIUM 8.8 7.8*   PT/INR No results found for this basename: LABPROT, INR,  in the last 72 hours  Studies/Results: Ct Abdomen Pelvis W Contrast  09/17/2013   CLINICAL DATA:  Abdomen pain for 2 days. History of colon cancer 3 years ago.  EXAM: CT ABDOMEN AND PELVIS WITH CONTRAST  TECHNIQUE: Multidetector CT imaging of the abdomen and pelvis was performed using the standard protocol following bolus administration of intravenous contrast.  CONTRAST:  81mL OMNIPAQUE IOHEXOL 300 MG/ML SOLN, 139mL OMNIPAQUE IOHEXOL 300 MG/ML SOLN  COMPARISON:  September 29, 2012  FINDINGS: No focal lesions identified within the liver. Patient is status post prior cholecystectomy with mild intra and extrahepatic biliary ductal dilatation. The common bile duct measures 11.7 mm postsurgical change. The spleen,  pancreas, adrenal glands are normal. There are simple cysts within the right kidney unchanged. The left parapelvic renal cyst is identified unchanged. There is atherosclerosis of the abdominal aorta without aneurysmal dilatation.  Stable postoperative changes are identified in the stomach. There are dilated small bowel loops in the abdomen and pelvis. There is question bowel wall thickening of ileal small bowel loops. Postsurgical changes of the colon are identified. Moderate bowel content is identified in the rectum. There is mild atelectasis and scar of the posterior lung bases. No acute abnormalities identified within the visualized bones.  IMPRESSION: Small bowel obstruction. Question ileal bowel wall thickening from possible inflammatory bowel disease or stricture.   Electronically Signed   By: Abelardo Diesel M.D.   On: 09/17/2013 20:29    Anti-infectives: Anti-infectives   None      Assessment/Plan: Impression: Small bowel dysfunction with resultant ileus/obstruction. Stable from surgical standpoint. No need for acute surgical intervention. Plan: We'll continue current management.  LOS: 2 days    Decker Cogdell A 09/19/2013

## 2013-09-20 MED ORDER — PANTOPRAZOLE SODIUM 40 MG PO TBEC
40.0000 mg | DELAYED_RELEASE_TABLET | Freq: Every day | ORAL | Status: DC
  Administered 2013-09-20 – 2013-09-23 (×4): 40 mg via ORAL
  Filled 2013-09-20 (×4): qty 1

## 2013-09-20 MED ORDER — KCL IN DEXTROSE-NACL 20-5-0.45 MEQ/L-%-% IV SOLN
INTRAVENOUS | Status: DC
  Administered 2013-09-20 – 2013-09-22 (×3): via INTRAVENOUS

## 2013-09-20 NOTE — Progress Notes (Signed)
Patient ambulated in the hallway about 400 feet with no problems or complaints. Patient was able to tolerate clear liquids at breakfast and lunch with some mild nausea/ no vomitting and received Zofran @ 0953. Patient requested to continue on a clear liquid diet for dinner. Will encourage patient to ambulate more this afternoon.

## 2013-09-20 NOTE — Progress Notes (Signed)
  Subjective: Had a bowel movement yesterday evening. Feels better. No abdominal pain noted.  Objective: Vital signs in last 24 hours: Temp:  [98 F (36.7 C)-98.1 F (36.7 C)] 98.1 F (36.7 C) (07/02 0451) Pulse Rate:  [68-78] 73 (07/02 0451) Resp:  [18] 18 (07/02 0451) BP: (117-122)/(69-77) 120/71 mmHg (07/02 0451) SpO2:  [96 %-97 %] 96 % (07/02 0451) Last BM Date: 09/16/13  Intake/Output from previous day: 07/01 0701 - 07/02 0700 In: 2300 [I.V.:2300] Out: 1900 [Urine:1050; Emesis/NG output:850] Intake/Output this shift:    General appearance: alert, cooperative and no distress Resp: clear to auscultation bilaterally Cardio: regular rate and rhythm, S1, S2 normal, no murmur, click, rub or gallop GI: soft, non-tender; bowel sounds normal; no masses,  no organomegaly  Lab Results:   Recent Labs  09/17/13 1830 09/18/13 0532  WBC 9.5 8.3  HGB 15.1 13.6  HCT 43.5 40.1  PLT 170 156   BMET  Recent Labs  09/17/13 1830 09/18/13 0532  NA 138 140  K 3.7 4.3  CL 98 102  CO2 27 28  GLUCOSE 66* 90  BUN 7 6  CREATININE 0.98 0.92  CALCIUM 8.8 7.8*   PT/INR No results found for this basename: LABPROT, INR,  in the last 72 hours  Studies/Results: No results found.  Anti-infectives: Anti-infectives   None      Assessment/Plan: Impression: Small bowel dysfunction, resolving. Plan: We'll start clear liquid diet. Will advance as tolerated.  LOS: 3 days    [name] A 09/20/2013

## 2013-09-21 NOTE — Progress Notes (Signed)
  Subjective: Tolerating liquids well. Has had 2 bowel movements over the past 24 hours. No nausea or vomiting.  Objective: Vital signs in last 24 hours: Temp:  [98 F (36.7 C)-98.5 F (36.9 C)] 98.1 F (36.7 C) (07/03 0445) Pulse Rate:  [63-70] 63 (07/03 0445) Resp:  [20] 20 (07/03 0445) BP: (112-123)/(62-80) 112/62 mmHg (07/03 0445) SpO2:  [96 %-98 %] 96 % (07/03 0445) Weight:  [62.869 kg (138 lb 9.6 oz)] 62.869 kg (138 lb 9.6 oz) (07/03 0445) Last BM Date: 09/20/13  Intake/Output from previous day: 07/02 0701 - 07/03 0700 In: 1365 [P.O.:360; I.V.:1005] Out: 2250 [Urine:2250] Intake/Output this shift:    General appearance: alert, cooperative and no distress Resp: clear to auscultation bilaterally Cardio: regular rate and rhythm, S1, S2 normal, no murmur, click, rub or gallop GI: soft, non-tender; bowel sounds normal; no masses,  no organomegaly  Lab Results:  No results found for this basename: WBC, HGB, HCT, PLT,  in the last 72 hours BMET No results found for this basename: NA, K, CL, CO2, GLUCOSE, BUN, CREATININE, CALCIUM,  in the last 72 hours PT/INR No results found for this basename: LABPROT, INR,  in the last 72 hours  Studies/Results: No results found.  Anti-infectives: Anti-infectives   None      Assessment/Plan: Impression: Bowel dysfunction, resolving Plan: We'll advance to soft diet. She tolerates this well, anticipate discharge in next 24-48 hours.  LOS: 4 days    Carolyne Whitsel A 09/21/2013

## 2013-09-21 NOTE — Progress Notes (Signed)
Late Entry for Approx 1200:  Dr. Arnoldo Morale notified of the patient having 1 large loose red bm after the suppository.  New orders given and followed to d/c the order for the suppository.

## 2013-09-22 MED ORDER — HYDROCORTISONE ACETATE 25 MG RE SUPP
25.0000 mg | Freq: Two times a day (BID) | RECTAL | Status: DC
  Administered 2013-09-22 – 2013-09-23 (×3): 25 mg via RECTAL
  Filled 2013-09-22 (×5): qty 1

## 2013-09-22 NOTE — Progress Notes (Signed)
  Subjective: Has had a bowel movement with streaking blood secondary to hemorrhoidal disease.  Abdominal pain much improved.  Objective: Vital signs in last 24 hours: Temp:  [98.1 F (36.7 C)-98.4 F (36.9 C)] 98.1 F (36.7 C) (07/04 0528) Pulse Rate:  [60-77] 60 (07/04 0528) Resp:  [20] 20 (07/04 0528) BP: (117-150)/(71-82) 138/82 mmHg (07/04 0528) SpO2:  [96 %-98 %] 98 % (07/04 0528) Weight:  [62.914 kg (138 lb 11.2 oz)] 62.914 kg (138 lb 11.2 oz) (07/04 0528) Last BM Date: 09/21/13  Intake/Output from previous day: 07/03 0701 - 07/04 0700 In: 2165 [P.O.:960; I.V.:1205] Out: 1900 [Urine:1900] Intake/Output this shift:    General appearance: alert, cooperative and no distress Resp: clear to auscultation bilaterally Cardio: regular rate and rhythm, S1, S2 normal, no murmur, click, rub or gallop GI: soft, non-tender; bowel sounds normal; no masses,  no organomegaly  Lab Results:  No results found for this basename: WBC, HGB, HCT, PLT,  in the last 72 hours BMET No results found for this basename: NA, K, CL, CO2, GLUCOSE, BUN, CREATININE, CALCIUM,  in the last 72 hours PT/INR No results found for this basename: LABPROT, INR,  in the last 72 hours  Studies/Results: No results found.  Anti-infectives: Anti-infectives   None      Assessment/Plan: Imp:  Bowel dysfunction, much improved Plan:  Anusol HC supp bid.  Encourage ambulation.  Anticipate discharge in next 24 hours.  LOS: 5 days    Kendallyn Lippold A 09/22/2013

## 2013-09-23 MED ORDER — HEPARIN SOD (PORK) LOCK FLUSH 100 UNIT/ML IV SOLN
500.0000 [IU] | Freq: Once | INTRAVENOUS | Status: AC
  Administered 2013-09-23: 500 [IU] via INTRAVENOUS
  Filled 2013-09-23: qty 5

## 2013-09-23 NOTE — Discharge Instructions (Signed)
Intestinal Obstruction  An intestinal obstruction is a blockage of the intestine. It can be caused by a physical blockage or by a problem of abnormal function of the intestine.   CAUSES   · Adhesions from previous surgeries.  · Cancer or tumor.  · A hernia, which is a condition in which a portion of the bowel bulges out through an opening or weakness in the abdomen. This sometimes squeezes the bowel.  · A swallowed object.  · Blockage (impaction) with worms is common in third world countries.  · A twisting of the bowel or telescoping of a portion of the bowel into another portion (intussusceptions).  · Anything that stops food from going through from the stomach to the anus.  SYMPTOMS   Symptoms of bowel obstruction may include abdominal bloating, nausea, vomiting, explosive diarrhea, or explosive stool. You may not be able to hear your normal bowel sounds (such as "growling in your stomach"). You may also stop having bowel movements or passing gas.  DIAGNOSIS   Usually this condition is diagnosed with a history and an examination. Often, lab studies (blood work) and X-rays may be used to find the cause.  TREATMENT   The main treatment for this condition is to rest the intestine. Often, the obstruction may relieve itself and allow the intestine to start working again. Think of the intestine like a balloon that is blown up (filled with trapped food and water that has squeezed into a hole or area that it cannot get through).   · If the obstruction is complete, a nasogastric (NG) tube is passed through the nose and into the stomach. It is then connected to suction to keep the stomach emptied out. This also helps treat the nausea and vomiting.  · If there is an imbalance in the electrolytes, they are corrected with intravenous fluids. These fluids have the proper chemicals in them to correct the problem.  · If the reason for the blockage does not get better with conservative (nonsurgical) treatment, surgery may be  necessary. Sometimes, surgery is done immediately if your surgeon knows that the problem is not going to get better with conservative treatment.  PROGNOSIS   Depending on what the problem is, most of these problems can be treated by your caregivers with good results. Your caregiver will discuss with you the best course of action to take.  FOLLOWING SURGERY  Seek immediate medical attention if you have:  · Increasing abdominal pain, repeated vomiting, dehydration, or fainting.  · Severe weakness, chest pain, or back pain.  · Blood in your vomit or stool.  · Tarry stool.  Document Released: 05/29/2003 Document Revised: 07/03/2012 Document Reviewed: 10/27/2007  [employer]® Patient Information ©2015 [employer]. This information is not intended to replace advice given to you by your health care provider. Make sure you discuss any questions you have with your health care provider.

## 2013-09-23 NOTE — Progress Notes (Addendum)
Patient discharged with instructions and care notes.  Verbalized understanding via teach back.  Flushed portacath with heparin prior to de accessing the site.  Patient voiced no further complaints or concerns at the time of discharge.  Appointments scheduled per instructions.  Patient left the floor via w/c with staff and family in stable condition.  Patient drove himself home.  He stated that he felt that he could.

## 2013-09-23 NOTE — Discharge Summary (Signed)
Physician Discharge Summary  [name] MRN: [id number] DOB/AGE: Jan 25, 1951 63 y.o.  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  Seen in ER, NGT placed.  Admitted for management and treatment.  No surgery required.  After several days, NGT removed, and diet advanced slowly.  Is being discharged on 09/23/13 in good and improving condition.  Discharge Exam: Blood pressure 155/91, pulse 52, temperature 98.2 F (36.8 C), temperature source Oral, resp. rate 18, height 5\' 9"  (1.753 m), weight 62.914 kg (138 lb 11.2 oz), SpO2 97.00%. General appearance: alert, cooperative and no distress Resp: clear to auscultation bilaterally Cardio: regular rate and rhythm, S1, S2 normal, no murmur, click, rub or gallop GI: soft, non-tender; bowel sounds normal; no masses,  no organomegaly  Disposition: 01-Home or Self Care     Medication List         alendronate 70 MG tablet  Commonly known as:  FOSAMAX  Take 70 mg by mouth every 7 (seven) days. On sundays     BENEFIBER PO  Take 1 each by mouth daily as needed (to regulate bowel movements/for supplement).     butalbital-acetaminophen-caffeine 50-325-40 MG per tablet  Commonly known as:  FIORICET, ESGIC  1 tablet 3 (three) times daily as needed for headache or migraine.     cholecalciferol 1000 UNITS tablet  Commonly known as:  VITAMIN D  Take 1,000 Units by mouth every morning.     cyanocobalamin 1000 MCG/ML injection  Commonly known as:  (VITAMIN B-12)  Inject 1,000 mcg into the muscle every 30 (thirty) days.     cyclobenzaprine 10 MG tablet  Commonly known as:  FLEXERIL  Take 10 mg by mouth daily as needed for muscle spasms.     dexlansoprazole 60 MG capsule  Commonly known as:  DEXILANT  Take 60 mg by mouth every morning.     dicyclomine 20 MG tablet  Commonly known as:  BENTYL  Take 1 tablet by mouth daily as needed for spasms.     diphenoxylate-atropine 2.5-0.025 MG per tablet  Commonly known as:  LOMOTIL  Take 1 tablet by mouth 2 (two) times daily.     docusate sodium 100 MG capsule  Commonly known as:  COLACE  Take 100 mg by mouth 2 (two) times daily.     enoxaparin 60 MG/0.6ML injection  Commonly known as:  LOVENOX  Inject 60 mg into the skin every evening.     ENSURE PLUS Liqd  Take 237 mLs by mouth daily.     FEROCON capsule  Generic drug:  ferrous [id number] C-folic acid  Take 1 capsule by mouth daily.     folic acid 1 MG tablet  Commonly known as:  FOLVITE  Take 1 mg by mouth every morning.     LORazepam 1 MG tablet  Commonly known as:  ATIVAN  Take 1 mg by mouth every 3 (three) hours as needed. anxiety     ondansetron 4 MG tablet  Commonly known as:  ZOFRAN  Take 4 mg by mouth daily as needed for nausea.     polyethylene glycol powder powder  Commonly known as:  GLYCOLAX/MIRALAX  Take 17 g by mouth as needed. For constipation     PROAIR HFA 108 (90 BASE) MCG/ACT inhaler  Generic drug:  albuterol  Inhale into the lungs every 6 (six) hours as needed for wheezing or shortness of breath.     prochlorperazine 10 MG tablet  Commonly known as:  COMPAZINE  Take 10 mg by mouth every 6 (six) hours as needed. Nausea and Vomiting     psyllium 0.52 G capsule  Commonly known as:  REGULOID  Take 2.6 g by mouth daily. *5 capsules taken twice daily*     sertraline 100 MG tablet  Commonly known as:  ZOLOFT  Take 150 mg by mouth at bedtime.     sucralfate 1 GM/10ML suspension  Commonly known as:  CARAFATE  Take 10 mLs (1 g total) by mouth 4 (four) times  daily. As needed     thiamine 100 MG tablet  Take 100 mg by mouth every morning.     traMADol 50 MG tablet  Commonly known as:  ULTRAM  Take 50 mg by mouth every 6 (six) hours as needed for moderate pain or severe pain.     valsartan-hydrochlorothiazide 80-12.5 MG per tablet  Commonly known as:  DIOVAN-HCT  Take 1 tablet by mouth every morning.     Vitamin D (Ergocalciferol) 50000 UNITS Caps capsule  Commonly known as:  DRISDOL  Take 50,000 Units by mouth once a week. Takes on Sunday           Follow-up Information   Follow up with Jamesetta So, MD. (As needed)    Specialty:  General Surgery   Contact information:   [address] [place] [address] [phone]       Signed: Aviva Signs A 09/23/2013, 12:08 PM

## 2013-09-24 NOTE — Progress Notes (Signed)
UR chart review completed.  

## 2013-10-04 ENCOUNTER — Encounter (HOSPITAL_COMMUNITY): Payer: Federal, State, Local not specified - PPO | Attending: Hematology and Oncology

## 2013-10-04 DIAGNOSIS — Z452 Encounter for adjustment and management of vascular access device: Secondary | ICD-10-CM

## 2013-10-04 DIAGNOSIS — C189 Malignant neoplasm of colon, unspecified: Secondary | ICD-10-CM | POA: Insufficient documentation

## 2013-10-04 DIAGNOSIS — C186 Malignant neoplasm of descending colon: Secondary | ICD-10-CM

## 2013-10-04 MED ORDER — SODIUM CHLORIDE 0.9 % IJ SOLN
10.0000 mL | INTRAMUSCULAR | Status: DC | PRN
  Administered 2013-10-04: 10 mL via INTRAVENOUS

## 2013-10-04 MED ORDER — HEPARIN SOD (PORK) LOCK FLUSH 100 UNIT/ML IV SOLN
500.0000 [IU] | Freq: Once | INTRAVENOUS | Status: AC
  Administered 2013-10-04: 500 [IU] via INTRAVENOUS

## 2013-10-04 MED ORDER — HEPARIN SOD (PORK) LOCK FLUSH 100 UNIT/ML IV SOLN
INTRAVENOUS | Status: AC
  Filled 2013-10-04: qty 5

## 2013-10-04 NOTE — Progress Notes (Signed)
[name] presented for Portacath access and flush. Proper placement of portacath confirmed by CXR. Portacath located lt chest wall accessed with  H 20 needle. Good blood return present. Portacath flushed with 36ml NS and 500U/80ml Heparin and needle removed intact. Procedure without incident. Patient tolerated procedure well.

## 2013-10-26 ENCOUNTER — Encounter: Payer: Self-pay | Admitting: Internal Medicine

## 2013-11-23 ENCOUNTER — Encounter (HOSPITAL_COMMUNITY): Payer: Self-pay

## 2013-11-23 ENCOUNTER — Encounter (HOSPITAL_COMMUNITY): Payer: Federal, State, Local not specified - PPO | Attending: Hematology and Oncology

## 2013-11-23 VITALS — BP 122/81 | HR 80 | Temp 98.7°F | Resp 18 | Wt 136.6 lb

## 2013-11-23 DIAGNOSIS — Z7901 Long term (current) use of anticoagulants: Secondary | ICD-10-CM

## 2013-11-23 DIAGNOSIS — K565 Intestinal adhesions [bands], unspecified as to partial versus complete obstruction: Secondary | ICD-10-CM

## 2013-11-23 DIAGNOSIS — K56609 Unspecified intestinal obstruction, unspecified as to partial versus complete obstruction: Secondary | ICD-10-CM

## 2013-11-23 DIAGNOSIS — C801 Malignant (primary) neoplasm, unspecified: Secondary | ICD-10-CM

## 2013-11-23 DIAGNOSIS — C189 Malignant neoplasm of colon, unspecified: Secondary | ICD-10-CM

## 2013-11-23 DIAGNOSIS — Z86711 Personal history of pulmonary embolism: Secondary | ICD-10-CM

## 2013-11-23 DIAGNOSIS — I2699 Other pulmonary embolism without acute cor pulmonale: Secondary | ICD-10-CM

## 2013-11-23 DIAGNOSIS — Z85118 Personal history of other malignant neoplasm of bronchus and lung: Secondary | ICD-10-CM

## 2013-11-23 LAB — CBC WITH DIFFERENTIAL/PLATELET
BASOS ABS: 0 10*3/uL (ref 0.0–0.1)
Basophils Relative: 0 % (ref 0–1)
EOS PCT: 2 % (ref 0–5)
Eosinophils Absolute: 0.2 10*3/uL (ref 0.0–0.7)
HCT: 40.6 % (ref 39.0–52.0)
Hemoglobin: 13.9 g/dL (ref 13.0–17.0)
LYMPHS ABS: 2.5 10*3/uL (ref 0.7–4.0)
Lymphocytes Relative: 36 % (ref 12–46)
MCH: 31.4 pg (ref 26.0–34.0)
MCHC: 34.2 g/dL (ref 30.0–36.0)
MCV: 91.9 fL (ref 78.0–100.0)
Monocytes Absolute: 0.8 10*3/uL (ref 0.1–1.0)
Monocytes Relative: 11 % (ref 3–12)
Neutro Abs: 3.4 10*3/uL (ref 1.7–7.7)
Neutrophils Relative %: 51 % (ref 43–77)
PLATELETS: 202 10*3/uL (ref 150–400)
RBC: 4.42 MIL/uL (ref 4.22–5.81)
RDW: 13.3 % (ref 11.5–15.5)
WBC: 6.8 10*3/uL (ref 4.0–10.5)

## 2013-11-23 LAB — COMPREHENSIVE METABOLIC PANEL
ALT: 11 U/L (ref 0–53)
AST: 19 U/L (ref 0–37)
Albumin: 3.3 g/dL — ABNORMAL LOW (ref 3.5–5.2)
Alkaline Phosphatase: 62 U/L (ref 39–117)
Anion gap: 11 (ref 5–15)
BUN: 9 mg/dL (ref 6–23)
CALCIUM: 8.8 mg/dL (ref 8.4–10.5)
CO2: 27 mEq/L (ref 19–32)
Chloride: 101 mEq/L (ref 96–112)
Creatinine, Ser: 0.97 mg/dL (ref 0.50–1.35)
GFR calc non Af Amer: 87 mL/min — ABNORMAL LOW (ref >90)
Glucose, Bld: 56 mg/dL — ABNORMAL LOW (ref 70–99)
Potassium: 4.2 mEq/L (ref 3.7–5.3)
SODIUM: 139 meq/L (ref 137–147)
TOTAL PROTEIN: 5.9 g/dL — AB (ref 6.0–8.3)
Total Bilirubin: 0.3 mg/dL (ref 0.3–1.2)

## 2013-11-23 LAB — D-DIMER, QUANTITATIVE (NOT AT ARMC)

## 2013-11-23 MED ORDER — SODIUM CHLORIDE 0.9 % IJ SOLN
10.0000 mL | INTRAMUSCULAR | Status: DC | PRN
  Administered 2013-11-23: 10 mL via INTRAVENOUS

## 2013-11-23 MED ORDER — HEPARIN SOD (PORK) LOCK FLUSH 100 UNIT/ML IV SOLN
500.0000 [IU] | Freq: Once | INTRAVENOUS | Status: AC
  Administered 2013-11-23: 500 [IU] via INTRAVENOUS
  Filled 2013-11-23: qty 5

## 2013-11-23 NOTE — Progress Notes (Deleted)
[name] presented for Portacath access and flush. Proper placement of portacath confirmed by CXR. Portacath located left chest wall accessed with  H 20 needle. Good blood return present. Portacath flushed with 30ml NS and 500U/63ml Heparin and needle removed intact. Procedure without incident. Patient tolerated procedure well.

## 2013-11-23 NOTE — Patient Instructions (Signed)
[facility] Discharge Instructions  RECOMMENDATIONS MADE BY THE CONSULTANT AND ANY TEST RESULTS WILL BE SENT TO YOUR REFERRING PHYSICIAN.  EXAM FINDINGS BY THE PHYSICIAN TODAY AND SIGNS OR SYMPTOMS TO REPORT TO CLINIC OR PRIMARY PHYSICIAN: Exam and findings as discussed by Dr. Barnet Glasgow.  Recommends that you restart the metoclopramide.  Report changes in bowel habits, blood in your stool or other concerns.  MEDICATIONS PRESCRIBED:  Restart Metoclopramide.  If abdominal swelling develops stop taking it.  INSTRUCTIONS/FOLLOW-UP: Port flushes every 6 weeks and office visit in 12 weeks.  Thank you for choosing [name] to provide your oncology and hematology care.  To afford each patient quality time with our providers, please arrive at least 15 minutes before your scheduled appointment time.  With your help, our goal is to use those 15 minutes to complete the necessary work-up to ensure our physicians have the information they need to help with your evaluation and healthcare recommendations.    Effective January 1st, 2014, we ask that you re-schedule your appointment with our physicians should you arrive 10 or more minutes late for your appointment.  We strive to give you quality time with our providers, and arriving late affects you and other patients whose appointments are after yours.    Again, thank you for choosing [facility].  Our hope is that these requests will decrease the amount of time that you wait before being seen by our physicians.       _____________________________________________________________  Should you have questions after your visit to [facility], please contact our office at [phone] between the hours of 8:30 a.m. and 4:30 p.m.  Voicemails left after 4:30 p.m. will not be returned until the following business day.  For prescription refill requests, have your pharmacy contact our office with your  prescription refill request.    _______________________________________________________________  We hope that we have given you very good care.  You may receive a patient satisfaction survey in the mail, please complete it and return it as soon as possible.  We value your feedback!  _______________________________________________________________  Have you asked about our STAR program?  STAR stands for Survivorship Training and Rehabilitation, and this is a nationally recognized cancer care program that focuses on survivorship and rehabilitation.  Cancer and cancer treatments may cause problems, such as, pain, making you feel tired and keeping you from doing the things that you need or want to do. Cancer rehabilitation can help. Our goal is to reduce these troubling effects and help you have the best quality of life possible.  You may receive a survey from a nurse that asks questions about your current state of health.  Based on the survey results, all eligible patients will be referred to the [facility] program for an evaluation so we can better serve you!  A frequently asked questions sheet is available upon request.

## 2013-11-23 NOTE — Progress Notes (Signed)
[facility]  OFFICE PROGRESS NOTE  [place], [place] [address]  DIAGNOSIS: Adenocarcinoma of colon - Plan: Schedule Portacath Flush Appointment, heparin lock flush 100 unit/mL, sodium chloride 0.9 % injection 10 mL, Ambulatory referral to Physical Therapy  Small bowel obstruction - Plan: Schedule Portacath Flush Appointment, heparin lock flush 100 unit/mL, sodium chloride 0.9 % injection 10 mL, Ambulatory referral to Physical Therapy  Pulmonary embolism - Plan: D-dimer, quantitative, Schedule Portacath Flush Appointment, heparin lock flush 100 unit/mL, sodium chloride 0.9 % injection 10 mL, D-dimer, quantitative, Ambulatory referral to Physical Therapy  Adenocarcinoma - Plan: CBC with Differential, Comprehensive metabolic panel, CEA  Chief Complaint  Patient presents with  . Cancer the colon  . Abdominal postoperative adhesions    CURRENT THERAPY: Watchful expectation.  INTERVAL HISTORY: [name] 63 y.o. male returns for followup of stage III colon cancer diagnosed in May of 2012 followed by FOLFOX for 7 of a planned 12 cycles ending on 01/04/2011 because of neuropathy. In January 2015 he suffered a small bowel obstruction treated conservatively with NG tube decompression.  He was admitted to the hospital on 09/17/2013 status 6 days with a bowel obstruction. He was treated with NG suction decompression only. Today he feels well with no melena, hematochezia, hematuria, abdominal distention, lower extremity swelling or redness, fever, night sweats, cough, wheezing, sore throat, skin rash, headache, or seizures.    MEDICAL HISTORY: Past Medical History  Diagnosis Date  . Pulmonary embolism 02/2010  . Hypertension   . Osteoporosis   . Arthritis   . TIA (transient ischemic attack) 10/11  . ETOH abuse     quit 03/2010  . 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 Personal history of PE (pulmonary embolism)  10/01/2010  . Blood transfusion   . S/P endoscopy September 28, 2010    erosive reflux esophagitis, Billroth I anatomy  . Shortness of breath   . Sleep apnea   . Recurrent upper respiratory infection (URI)   . Anxiety   . Pneumonia   . Anemia   . Ileus   . Chronic abdominal pain   . GERD (gastroesophageal reflux disease)   . Chest pain at rest   . Erosive esophagitis   . Vitamin B12 deficiency   . Bowel obstruction 05/13/2012    Recurrent  . Hx of Clostridium difficile infection 01/2012  . Bronchitis   . 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    Stage 3    INTERIM HISTORY: has Adenocarcinoma of colon with mucinous features; Bleeding gastrointestinal; Supratherapeutic INR; Abdominal  pain, other specified site; Nausea and vomiting in adult; Anemia, chronic disease; Fever chills; S/P partial gastrectomy; Personal history of PE (pulmonary embolism); Bronchitis, acute; Erosive esophagitis; Esophageal reflux disease; Coffee ground emesis; Hematemesis; Chronic abdominal pain; Pancytopenia due to antineoplastic chemotherapy; Pneumonia; Pulmonary embolism; Chest pain at rest; Small bowel obstruction; Left sided abdominal pain; GI bleed; Coagulopathy; Gastroenteritis; Partial small bowel obstruction; Nausea; Abdominal distension; HTN (hypertension); Diarrhea; Cellulitis; Esophageal dysphagia; H/O Clostridium difficile infection; Rectal bleeding; Bowel obstruction; Abnormal CT scan, colon; and SBO (small bowel obstruction) on his problem list.    ALLERGIES:  has No Known Allergies.  MEDICATIONS: has a current medication list which includes the following prescription(s): albuterol, alendronate, butalbital-acetaminophen-caffeine, cholecalciferol, cyanocobalamin, cyclobenzaprine, dexlansoprazole, dicyclomine, docusate sodium, enoxaparin, ensure plus, ferrous [id number] c-folic acid, folic acid, lorazepam, ondansetron, polyethylene glycol powder, prochlorperazine, psyllium, sertraline,  sucralfate, thiamine, tramadol, valsartan-hydrochlorothiazide,  vitamin d (ergocalciferol), wheat dextrin, and diphenoxylate-atropine, and the following Facility-Administered Medications: sodium chloride.  SURGICAL HISTORY:  Past Surgical History  Procedure Laterality Date  . Hernia repair      right inguinal  . Appendectomy  1980s  . Cholecystectomy  1980s  . Portacath placement    . Abdominal sugery      for bowel obstruction x 8, all in 1980s, except for one in 07/2010  . Esophagogastroduodenoscopy  09/28/2010  . Esophagogastroduodenoscopy  12/01/2010    Cervical web status post dilation, erosive esophagitis, B1 hemigastrectomy, inflamed anastomosis  . Colonoscopy  03/18/2011    anastomosis at 35cm. Several adenomatous polyps removed. Sigmoid diverticulosis. Next TCS 02/2013  . Esophagogastroduodenoscopy  04/16/2011    excoriation at GEJ c/w trauma/M-W tear, friable gastric anastomosis, dilation efferent limb  . Billroth 1 hemigastrectomy  1980s    per patient for benign duodenal tumor  . Esophagogastroduodenoscopy (egd) with esophageal dilation  02/25/2012    TDV:VOHYWVPX esophageal web-s/p dilation anddisruption as described above. Status post prior gastric with Billroth I configuration. Abnormal gastric mucosa at the anastomosis. Gastric biopsy showed mild chronic inflammation but no H. pylori   . Colonoscopy N/A 07/24/2012    TGG:YIRSWN post segmental resection with normal-appearing colonic anastomosis aside from an adjacent polyp-removed as described above. Rectal polyp-removed as described above. CT findings appear to have been artifactual. tubular adenomas/prolapsed type polyp.  . Colon surgery  May 2012    left hemicolectomy, colon cancer found at time of surgery for bowel obstruction  . Cardiac catheterization  07/17/2012    FAMILY HISTORY: family history includes Arthritis in his mother; Heart attack in his father; Hypertension in his father and mother; Pneumonia in his  mother.  SOCIAL HISTORY:  reports that he quit smoking about 11 months ago. His smoking use included Cigarettes. He has a 20 pack-year smoking history. He has never used smokeless tobacco. He reports that he does not drink alcohol or use illicit drugs.  REVIEW OF SYSTEMS:  Other than that discussed above is noncontributory.  PHYSICAL EXAMINATION: ECOG PERFORMANCE STATUS: 1 - Symptomatic but completely ambulatory  Blood pressure 122/81, pulse 80, temperature 98.7 F (37.1 C), temperature source Oral, resp. rate 18, weight 136 lb 9.6 oz (61.961 kg), SpO2 99.00%.  GENERAL:alert, no distress and comfortable SKIN: skin color, texture, turgor are normal, no rashes or significant lesions EYES: PERLA; Conjunctiva are pink and non-injected, sclera clear SINUSES: No redness or tenderness over maxillary or ethmoid sinuses OROPHARYNX:no exudate, no erythema on lips, buccal mucosa, or tongue. NECK: supple, thyroid normal size, non-tender, without nodularity. No masses CHEST: Normal AP diameter with light port in place. LYMPH:  no palpable lymphadenopathy in the cervical, axillary or inguinal LUNGS: clear to auscultation and percussion with normal breathing effort HEART: regular rate & rhythm and no murmurs. ABDOMEN:abdomen soft, non-tender and normal bowel sounds. No tympany. No CVA tenderness or free fluid wave. MUSCULOSKELETAL:no cyanosis of digits and no clubbing. Range of motion normal.  NEURO: alert & oriented x 3 with fluent speech, no focal motor/sensory deficits   LABORATORY DATA: Office Visit on 11/23/2013  Component Date Value Ref Range Status  . WBC 11/23/2013 6.8  4.0 - 10.5 K/uL Final  . RBC 11/23/2013 4.42  4.22 - 5.81 MIL/uL Final  . Hemoglobin 11/23/2013 13.9  13.0 - 17.0 g/dL Final  . HCT 11/23/2013 40.6  39.0 - 52.0 % Final  . MCV 11/23/2013 91.9  78.0 - 100.0 fL Final  . MCH 11/23/2013 31.4  26.0 - 34.0 pg Final  . MCHC 11/23/2013 34.2  30.0 - 36.0 g/dL Final  . RDW  11/23/2013 13.3  11.5 - 15.5 % Final  . Platelets 11/23/2013 202  150 - 400 K/uL Final  . Neutrophils Relative % 11/23/2013 51  43 - 77 % Final  . Neutro Abs 11/23/2013 3.4  1.7 - 7.7 K/uL Final  . Lymphocytes Relative 11/23/2013 36  12 - 46 % Final  . Lymphs Abs 11/23/2013 2.5  0.7 - 4.0 K/uL Final  . Monocytes Relative 11/23/2013 11  3 - 12 % Final  . Monocytes Absolute 11/23/2013 0.8  0.1 - 1.0 K/uL Final  . Eosinophils Relative 11/23/2013 2  0 - 5 % Final  . Eosinophils Absolute 11/23/2013 0.2  0.0 - 0.7 K/uL Final  . Basophils Relative 11/23/2013 0  0 - 1 % Final  . Basophils Absolute 11/23/2013 0.0  0.0 - 0.1 K/uL Final    PATHOLOGY: No new pathology.  Urinalysis    Component Value Date/Time   COLORURINE YELLOW 09/17/2013 1745   APPEARANCEUR CLEAR 09/17/2013 1745   LABSPEC <1.005* 09/17/2013 1745   PHURINE 5.5 09/17/2013 1745   GLUCOSEU 100* 09/17/2013 1745   HGBUR NEGATIVE 09/17/2013 1745   BILIRUBINUR NEGATIVE 09/17/2013 1745   KETONESUR NEGATIVE 09/17/2013 1745   PROTEINUR NEGATIVE 09/17/2013 1745   UROBILINOGEN 0.2 09/17/2013 1745   NITRITE NEGATIVE 09/17/2013 1745   LEUKOCYTESUR NEGATIVE 09/17/2013 1745    RADIOGRAPHIC STUDIES: CT Abdomen Pelvis W Contrast Status: Final result         PACS Images    Show images for CT Abdomen Pelvis W Contrast         Study Result    CLINICAL DATA: Abdomen pain for 2 days. History of colon cancer 3  years ago.  EXAM:  CT ABDOMEN AND PELVIS WITH CONTRAST  TECHNIQUE:  Multidetector CT imaging of the abdomen and pelvis was performed  using the standard protocol following bolus administration of  intravenous contrast.  CONTRAST: 89mL OMNIPAQUE IOHEXOL 300 MG/ML SOLN, 180mL OMNIPAQUE  IOHEXOL 300 MG/ML SOLN  COMPARISON: September 29, 2012  FINDINGS:  No focal lesions identified within the liver. Patient is status post  prior cholecystectomy with mild intra and extrahepatic biliary  ductal dilatation. The common bile duct measures  11.7 mm  postsurgical change. The spleen, pancreas, adrenal glands are  normal. There are simple cysts within the right kidney unchanged.  The left parapelvic renal cyst is identified unchanged. There is  atherosclerosis of the abdominal aorta without aneurysmal  dilatation.  Stable postoperative changes are identified in the stomach. There  are dilated small bowel loops in the abdomen and pelvis. There is  question bowel wall thickening of ileal small bowel loops.  Postsurgical changes of the colon are identified. Moderate bowel  content is identified in the rectum. There is mild atelectasis and  scar of the posterior lung bases. No acute abnormalities identified  within the visualized bones.  IMPRESSION:  Small bowel obstruction. Question ileal bowel wall thickening from  possible inflammatory bowel disease or stricture.  Electronically Signed  By: Abelardo Diesel M.D.  On: 09/17/2013      ASSESSMENT: #1. Stage III-a left colon cancer, status post sigmoid resection followed by 7 cycles of FOLFOX producing peripheral neuropathy necessitating discontinuation of treatment with original surgery in 07/31/2010, no evidence of disease on last CT scan done in January 2015.  #2. Intermittent small bowel obstruction,  status post surgery in July 2014 for lysis  of adhesions., Last episode on 09/17/2013 treated with NG tube decompression. #3. History of pulmonary embolism on 2 occasions, no evidence of recurrence, on long-term anticoagulation with Lovenox     PLAN:  #1. Resume metoclopramide 5-10 mg 3 times a day before meals and at bedtime to prevent future bowel obstruction. Patient was told to stop the drug should he develop abdominal distention. #2. Continue Lovenox subcutaneously. Because of gastrointestinal problems, oral Xarelto was not recommended. #3. Followup in 3 months with CBC, chem profile, CEA   All questions were answered. The patient knows to call the clinic with any problems,  questions or concerns. We can certainly see the patient much sooner if necessary.   I spent 25 minutes counseling the patient face to face. The total time spent in the appointment was 30 minutes.    Doroteo Bradford, MD 11/23/2013 10:10 AM  DISCLAIMER:  This note was dictated with voice recognition software.  Similar sounding words can inadvertently be transcribed inaccurately and may not be corrected upon review.

## 2013-11-23 NOTE — Progress Notes (Signed)
[name] presented for labwork. Labs per MD order drawn via Portacath located in the left chest wall accessed with  H 20 needle. Good blood return present. Procedure without incident.  Needle removed intact. Patient tolerated procedure well.  STAR Program Physical Impairment and Functional Assessment Screening Tool  1. Are you having any pain, including headaches, joint pain, or muscle pain (upper body = OT; lower body = PT)?  Yes, this started after my diagnosis and is still a problem.  2. Do your hands and/or feet feel numb or tingle (PT)?  Yes, but I hand this before my cancer diagnosis.  3. Does any part of your body feel swollen or larger than usual (upper body = OT; lower body = PT)?  Yes, this started after my diagnosis and is still a problem.  4. Are you so tired that you cannot do the things you want or need to do (PT or OT)?  Yes, this started after my diagnosis and is still a problem.  5. Are you feeling weak or are you having trouble moving any part of your body (PT/OT)?  Yes, this started after my diagnosis and is still a problem.  6. Are you having trouble concentrating, thinking, or remembering things (OT/ST)?  Yes, but I hand this before my cancer diagnosis.  7. Are you having trouble moving around or feel like you might trip or fall (PT)?  No  8. Are you having trouble swallowing (ST)?  No  9. Are you having trouble speaking (ST)?  No  10. Are you having trouble with going or getting to the bathroom (OT)?  No  11. Are you having trouble with your sexual function (OT)?  Yes, but I hand this before my cancer diagnosis.  12. Are you having trouble lifting things, even just your arms (OT/PT)?  No  13. Are you having trouble taking care of yourself as in dressing or bathing (OT)?  No  14. Are you having trouble with daily tasks like chores or shopping (OT)?  No  15. Are you having trouble driving (OT)?  No  16. Are you having  trouble returning to work or completing your tasks at work (OT)?  Yes, this started after my diagnosis and is still a problem.  Other concerns:    Legend: OT = Occupational Therapy PT = Physical Therapy ST = Speech Therapy

## 2013-11-24 LAB — CEA: CEA: 2.2 ng/mL (ref 0.0–5.0)

## 2013-12-11 ENCOUNTER — Encounter: Payer: Self-pay | Admitting: Internal Medicine

## 2013-12-11 ENCOUNTER — Ambulatory Visit (INDEPENDENT_AMBULATORY_CARE_PROVIDER_SITE_OTHER): Payer: Federal, State, Local not specified - PPO | Admitting: Internal Medicine

## 2013-12-11 VITALS — BP 153/89 | HR 80 | Temp 96.9°F | Ht 69.0 in | Wt 140.4 lb

## 2013-12-11 DIAGNOSIS — R197 Diarrhea, unspecified: Secondary | ICD-10-CM

## 2013-12-11 DIAGNOSIS — K219 Gastro-esophageal reflux disease without esophagitis: Secondary | ICD-10-CM

## 2013-12-11 NOTE — Patient Instructions (Addendum)
Continue Dexilant 60 mg daily  Use Colace daily as needed  Take a probiotic such as phillips colon health or digestive advantage  Office visit in 3 months

## 2013-12-11 NOTE — Progress Notes (Signed)
Primary Care Physician:  Netta Cedars, MD Primary Gastroenterologist:  Dr. [name]  Pre-Procedure History & Physical: HPI:  [name] is a 63 y.o. male here for followup. States he feels bad all the time. Feels like how he felt with the chemotherapy previously. Labs from the first of the month (chem-20 and CBC are concerned -good). He has any rectal bleeding. States he has chronic diarrhea, however ,he takes Colace daily and states he does not take it he gets constipated takes MiraLax on occasion for constipation. No vomiting. No dysphagia. GERD symptoms well controlled on Dexilant. Weight down 3 pounds since he was seen the first of the year. Has chronic insomnia. Was admitted to [facility] over the summer with a small bowel obstruction. He's had multiple colon resections as well as some small bowel resection over time.  Past Medical History  Diagnosis Date  . Pulmonary embolism 02/2010  . Hypertension   . Osteoporosis   . Arthritis   . TIA (transient ischemic attack) 10/11  . ETOH abuse     quit 03/2010  . 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 Personal history of PE (pulmonary embolism) 10/01/2010  . Blood transfusion   . S/P endoscopy September 28, 2010    erosive reflux esophagitis, Billroth I anatomy  . Shortness of breath   . Sleep apnea   . Recurrent upper respiratory infection (URI)   . Anxiety   . Pneumonia   . Anemia   . Ileus   . Chronic abdominal pain   . GERD (gastroesophageal reflux disease)   . Chest pain at rest   . Erosive esophagitis   . Vitamin B12 deficiency   . Bowel obstruction 05/13/2012    Recurrent  . Hx of Clostridium difficile infection 01/2012  . Bronchitis   . 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 Hernia repair      right inguinal  . Appendectomy  1980s  . Cholecystectomy  1980s  . Portacath placement    . Abdominal sugery      for bowel obstruction x  8, all in 1980s, except for one in 07/2010  . Esophagogastroduodenoscopy  09/28/2010  . Esophagogastroduodenoscopy  12/01/2010    Cervical web status post dilation, erosive esophagitis, B1 hemigastrectomy, inflamed anastomosis  . Colonoscopy  03/18/2011    anastomosis at 35cm. Several adenomatous polyps removed. Sigmoid diverticulosis. Next TCS 02/2013  . Esophagogastroduodenoscopy  04/16/2011    excoriation at GEJ c/w trauma/M-W tear, friable gastric anastomosis, dilation efferent limb  . Billroth 1 hemigastrectomy  1980s    per patient for benign duodenal tumor  . Esophagogastroduodenoscopy (egd) with esophageal dilation  02/25/2012    QJF:HLKTGYBW esophageal web-s/p dilation anddisruption as described above. Status post prior gastric with Billroth I configuration. Abnormal gastric mucosa at the anastomosis. Gastric biopsy showed mild chronic inflammation but no H. pylori   . Colonoscopy N/A 07/24/2012    LSL:HTDSKA post segmental resection with normal-appearing colonic anastomosis aside from an adjacent polyp-removed as described above. Rectal polyp-removed as described above. CT findings appear to have been artifactual. tubular adenomas/prolapsed type polyp.  . Colon surgery  May 2012    left hemicolectomy, colon cancer found at time of surgery for bowel obstruction  . Cardiac catheterization  07/17/2012    Prior to Admission medications   Medication Sig Start Date End Date Taking? Authorizing Provider  alendronate (FOSAMAX) 70 MG tablet  Take 70 mg by mouth every 7 (seven) days. On sundays 10/14/11  Yes Historical Provider, MD  butalbital-acetaminophen-caffeine (FIORICET, ESGIC) 50-325-40 MG per tablet 1 tablet 3 (three) times daily as needed for headache or migraine.  04/10/13  Yes Historical Provider, MD  cholecalciferol (VITAMIN D) 1000 UNITS tablet Take 1,000 Units by mouth every morning.   Yes Historical Provider, MD  cyanocobalamin (,VITAMIN B-12,) 1000 MCG/ML injection Inject 1,000 mcg into  the muscle every 30 (thirty) days.   Yes Historical Provider, MD  cyclobenzaprine (FLEXERIL) 10 MG tablet Take 10 mg by mouth daily as needed for muscle spasms.  11/18/11  Yes Historical Provider, MD  dexlansoprazole (DEXILANT) 60 MG capsule Take 60 mg by mouth every morning. 03/22/11  Yes Mahala Menghini, PA-C  dicyclomine (BENTYL) 20 MG tablet Take 1 tablet by mouth daily as needed for spasms.  04/24/13  Yes Historical Provider, MD  diphenoxylate-atropine (LOMOTIL) 2.5-0.025 MG per tablet Take 1 tablet by mouth 2 (two) times daily. As needed 04/10/13  Yes Historical Provider, MD  docusate sodium (COLACE) 100 MG capsule Take 100 mg by mouth 2 (two) times daily.   Yes Historical Provider, MD  enoxaparin (LOVENOX) 60 MG/0.6ML injection Inject 60 mg into the skin every evening.    Yes Historical Provider, MD  ENSURE PLUS (ENSURE PLUS) LIQD Take 237 mLs by mouth daily. Every other day   Yes Historical Provider, MD  ferrous sulfate 325 (65 FE) MG tablet Take 325 mg by mouth daily with breakfast.   Yes Historical Provider, MD  folic acid (FOLVITE) 1 MG tablet Take 1 mg by mouth every morning.    Yes Historical Provider, MD  LORazepam (ATIVAN) 1 MG tablet Take 1 mg by mouth every 4 (four) hours as needed. anxiety   Yes Historical Provider, MD  ondansetron (ZOFRAN) 4 MG tablet Take 4 mg by mouth daily as needed for nausea.   Yes Historical Provider, MD  prochlorperazine (COMPAZINE) 10 MG tablet Take 10 mg by mouth every 6 (six) hours as needed. Nausea and Vomiting   Yes Historical Provider, MD  psyllium (REGULOID) 0.52 G capsule Take 2.6 g by mouth daily. *5 capsules taken twice daily*   Yes Historical Provider, MD  sertraline (ZOLOFT) 100 MG tablet Take 150 mg by mouth at bedtime.    Yes Historical Provider, MD  simvastatin (ZOCOR) 10 MG tablet Take 10 mg by mouth daily.   Yes Historical Provider, MD  sucralfate (CARAFATE) 1 GM/10ML suspension Take 10 mLs (1 g total) by mouth 4 (four) times daily. As needed  05/01/13  Yes Mahala Menghini, PA-C  thiamine 100 MG tablet Take 100 mg by mouth every morning.    Yes Historical Provider, MD  traMADol (ULTRAM) 50 MG tablet Take 50 mg by mouth every 6 (six) hours as needed for moderate pain or severe pain.    Yes Historical Provider, MD  valsartan-hydrochlorothiazide (DIOVAN-HCT) 80-12.5 MG per tablet Take 1 tablet by mouth every morning.    Yes Historical Provider, MD  Vitamin D, Ergocalciferol, (DRISDOL) 50000 UNITS CAPS Take 50,000 Units by mouth once a week. Takes on Sunday 03/13/12  Yes Historical Provider, MD  Wheat Dextrin (BENEFIBER PO) Take 1 each by mouth daily as needed (to regulate bowel movements/for supplement).    Yes Historical Provider, MD  albuterol (PROAIR HFA) 108 (90 BASE) MCG/ACT inhaler Inhale into the lungs every 6 (six) hours as needed for wheezing or shortness of breath.    Historical Provider, MD  ferrous  [id number] C-folic acid (FEROCON) capsule Take 1 capsule by mouth daily.    Historical Provider, MD  polyethylene glycol powder (GLYCOLAX/MIRALAX) powder Take 17 g by mouth as needed. For constipation    Historical Provider, MD    Allergies as of 12/11/2013  . (No Known Allergies)    Family History  Problem Relation Age of Onset  . Hypertension Mother   . Arthritis Mother   . Pneumonia Mother   . Hypertension Father   . Heart attack Father     History   Social History  . Marital Status: Married    Spouse Name: N/A    Number of Children: 3  . Years of Education: N/A   Occupational History  .  [employer]   Social History Main Topics  . Smoking status: Former Smoker -- 0.50 packs/day for 40 years    Types: Cigarettes    Quit date: 12/20/2012  . Smokeless tobacco: Never Used     Comment: Quit x 1 year  . Alcohol Use: No     Comment: last drink was Apr 16 2010  . Drug Use: No  . Sexual Activity: Yes   Other Topics Concern  . Not on file   Social History Narrative  . No narrative on file     Review of Systems: See HPI, otherwise negative ROS  Physical Exam: BP 153/89  Pulse 80  Temp(Src) 96.9 F (36.1 C) (Oral)  Ht 5\' 9"  (1.753 m)  Wt 140 lb 6.4 oz (63.685 kg)  BMI 20.72 kg/m2 General:   Alert,  Well-developed, well-nourished, pleasant and cooperative in NAD Skin:  Intact without significant lesions or rashes. Eyes:  Sclera clear, no icterus.   Conjunctiva pink. Ears:  Normal auditory acuity. Nose:  No deformity, discharge,  or lesions. Mouth:  No deformity or lesions. Neck:  Supple; no masses or thyromegaly. No significant cervical adenopathy. Lungs:  Clear throughout to auscultation.   No wheezes, crackles, or rhonchi. No acute distress. Heart:  Regular rate and rhythm; no murmurs, clicks, rubs,  or gallops. Abdomen: Non-distended, normal bowel sounds.  Soft and nontender without appreciable mass or hepatosplenomegaly.  Pulses:  Normal pulses noted. Extremities:  Without clubbing or edema.  Impression:  63 year old gentleman with multiple GI surgeries and multiple small bowel obstructions the setting of a history of colon cancer now with fatigue feeling bad in general. Requires Colace to keep from getting "constipated" although he complains of chronic diarrhea. He is not infrequently constipated requiring MiraLax. He's lost 3 pounds. Lab work through hematology/oncology from earlier this month really look good; no anemia, his LFTs are good. Patient has chronic insomnia and this may be contributing to his symptoms. I doubt he has short bowel syndrome. He has experienced CNS side effects with Reglan previously and it was recommended that this medication be stopped through this office.  Reglan really does not have any promotility effect distally beyond the stomach. The risks are not work the theoretical benefits.  Recommendations:   Continue Dexilant 60 mg daily  Use Colace daily as needed  Take a probiotic such as phillips colon health or digestive advantage  See  primary care physician in regards to insomnia  Would help him seek out a second opinion if he desires.  Office visit in 3 months      Notice: This dictation was prepared with Dragon dictation along with smaller phrase technology. Any transcriptional errors that result from this process are unintentional and may not be corrected upon  review.

## 2014-01-04 ENCOUNTER — Encounter (HOSPITAL_COMMUNITY): Payer: Federal, State, Local not specified - PPO | Attending: Hematology and Oncology

## 2014-01-04 VITALS — BP 128/84 | HR 95 | Temp 98.0°F | Resp 20 | Wt 140.8 lb

## 2014-01-04 DIAGNOSIS — Z23 Encounter for immunization: Secondary | ICD-10-CM

## 2014-01-04 DIAGNOSIS — Z85038 Personal history of other malignant neoplasm of large intestine: Secondary | ICD-10-CM

## 2014-01-04 DIAGNOSIS — C189 Malignant neoplasm of colon, unspecified: Secondary | ICD-10-CM | POA: Diagnosis present

## 2014-01-04 LAB — FERRITIN: FERRITIN: 67 ng/mL (ref 22–322)

## 2014-01-04 MED ORDER — INFLUENZA VAC SPLIT QUAD 0.5 ML IM SUSY
0.5000 mL | PREFILLED_SYRINGE | Freq: Once | INTRAMUSCULAR | Status: AC
  Administered 2014-01-04: 0.5 mL via INTRAMUSCULAR

## 2014-01-04 MED ORDER — SODIUM CHLORIDE 0.9 % IJ SOLN
20.0000 mL | Freq: Once | INTRAMUSCULAR | Status: AC
  Administered 2014-01-04: 20 mL via INTRAVENOUS

## 2014-01-04 MED ORDER — INFLUENZA VAC SPLIT QUAD 0.5 ML IM SUSY
PREFILLED_SYRINGE | INTRAMUSCULAR | Status: AC
  Filled 2014-01-04: qty 0.5

## 2014-01-04 MED ORDER — HEPARIN SOD (PORK) LOCK FLUSH 100 UNIT/ML IV SOLN
500.0000 [IU] | Freq: Once | INTRAVENOUS | Status: AC
  Administered 2014-01-04: 500 [IU] via INTRAVENOUS
  Filled 2014-01-04: qty 5

## 2014-01-04 NOTE — Patient Instructions (Signed)
[name] Discharge Instructions  RECOMMENDATIONS MADE BY THE CONSULTANT AND ANY TEST RESULTS WILL BE SENT TO YOUR REFERRING PHYSICIAN.  EXAM FINDINGS BY THE PHYSICIAN TODAY AND SIGNS OR SYMPTOMS TO REPORT TO CLINIC OR PRIMARY PHYSICIAN:   Port flush today and FLU shot given!!!!  Ferritin level drawn today. If it is low - we will probably be calling you to come in and get a Feraheme infusion.  Return on 02/13/14 for port flush and to see the MD    Thank you for choosing [facility] to provide your oncology and hematology care.  To afford each patient quality time with our providers, please arrive at least 15 minutes before your scheduled appointment time.  With your help, our goal is to use those 15 minutes to complete the necessary work-up to ensure our physicians have the information they need to help with your evaluation and healthcare recommendations.    Effective January 1st, 2014, we ask that you re-schedule your appointment with our physicians should you arrive 10 or more minutes late for your appointment.  We strive to give you quality time with our providers, and arriving late affects you and other patients whose appointments are after yours.    Again, thank you for choosing [facility].  Our hope is that these requests will decrease the amount of time that you wait before being seen by our physicians.       _____________________________________________________________  Should you have questions after your visit to [facility], please contact our office at [phone] between the hours of 8:30 a.m. and 5:00 p.m.  Voicemails left after 4:30 p.m. will not be returned until the following business day.  For prescription refill requests, have your pharmacy contact our office with your prescription refill request.

## 2014-01-04 NOTE — Progress Notes (Signed)
[name] presents today for injection per MD orders. FLU shot administered IM in left Upper Arm. Administration without incident. Patient tolerated well.  [name] presented for Portacath access and flush. Proper placement of portacath confirmed by CXR. Portacath located LT chest wall accessed with  H 20 needle. Good blood return present. Portacath flushed with 47ml NS and 500U/29ml Heparin and needle removed intact. Procedure without incident. Patient tolerated procedure well.

## 2014-01-30 ENCOUNTER — Encounter: Payer: Self-pay | Admitting: Internal Medicine

## 2014-02-08 ENCOUNTER — Emergency Department (HOSPITAL_COMMUNITY): Payer: Federal, State, Local not specified - PPO

## 2014-02-08 ENCOUNTER — Inpatient Hospital Stay (HOSPITAL_COMMUNITY)
Admission: EM | Admit: 2014-02-08 | Discharge: 2014-02-17 | DRG: 390 | Disposition: A | Discharge status: Home / Self Care | Payer: Federal, State, Local not specified - PPO | Attending: General Surgery | Admitting: General Surgery

## 2014-02-08 ENCOUNTER — Encounter (HOSPITAL_COMMUNITY): Payer: Self-pay

## 2014-02-08 DIAGNOSIS — R1030 Lower abdominal pain, unspecified: Secondary | ICD-10-CM | POA: Diagnosis present

## 2014-02-08 DIAGNOSIS — G4730 Sleep apnea, unspecified: Secondary | ICD-10-CM | POA: Diagnosis present

## 2014-02-08 DIAGNOSIS — K565 Intestinal adhesions [bands] with obstruction (postprocedural) (postinfection): Secondary | ICD-10-CM | POA: Diagnosis present

## 2014-02-08 DIAGNOSIS — Z86711 Personal history of pulmonary embolism: Secondary | ICD-10-CM | POA: Diagnosis not present

## 2014-02-08 DIAGNOSIS — R109 Unspecified abdominal pain: Secondary | ICD-10-CM

## 2014-02-08 DIAGNOSIS — M1990 Unspecified osteoarthritis, unspecified site: Secondary | ICD-10-CM | POA: Diagnosis present

## 2014-02-08 DIAGNOSIS — K5900 Constipation, unspecified: Secondary | ICD-10-CM | POA: Diagnosis present

## 2014-02-08 DIAGNOSIS — Z79899 Other long term (current) drug therapy: Secondary | ICD-10-CM | POA: Diagnosis not present

## 2014-02-08 DIAGNOSIS — Z87891 Personal history of nicotine dependence: Secondary | ICD-10-CM

## 2014-02-08 DIAGNOSIS — M810 Age-related osteoporosis without current pathological fracture: Secondary | ICD-10-CM | POA: Diagnosis present

## 2014-02-08 DIAGNOSIS — I10 Essential (primary) hypertension: Secondary | ICD-10-CM | POA: Diagnosis present

## 2014-02-08 DIAGNOSIS — K56609 Unspecified intestinal obstruction, unspecified as to partial versus complete obstruction: Secondary | ICD-10-CM | POA: Diagnosis present

## 2014-02-08 DIAGNOSIS — F419 Anxiety disorder, unspecified: Secondary | ICD-10-CM | POA: Diagnosis present

## 2014-02-08 DIAGNOSIS — K219 Gastro-esophageal reflux disease without esophagitis: Secondary | ICD-10-CM | POA: Diagnosis present

## 2014-02-08 DIAGNOSIS — R1012 Left upper quadrant pain: Secondary | ICD-10-CM

## 2014-02-08 DIAGNOSIS — K598 Other specified functional intestinal disorders: Secondary | ICD-10-CM | POA: Diagnosis present

## 2014-02-08 DIAGNOSIS — Z8673 Personal history of transient ischemic attack (TIA), and cerebral infarction without residual deficits: Secondary | ICD-10-CM | POA: Diagnosis not present

## 2014-02-08 DIAGNOSIS — K56600 Partial intestinal obstruction, unspecified as to cause: Secondary | ICD-10-CM

## 2014-02-08 LAB — COMPREHENSIVE METABOLIC PANEL
ALBUMIN: 3.2 g/dL — AB (ref 3.5–5.2)
ALT: 16 U/L (ref 0–53)
AST: 24 U/L (ref 0–37)
Alkaline Phosphatase: 76 U/L (ref 39–117)
Anion gap: 13 (ref 5–15)
BUN: 11 mg/dL (ref 6–23)
CALCIUM: 9.2 mg/dL (ref 8.4–10.5)
CHLORIDE: 100 meq/L (ref 96–112)
CO2: 25 mEq/L (ref 19–32)
CREATININE: 1.12 mg/dL (ref 0.50–1.35)
GFR calc Af Amer: 79 mL/min — ABNORMAL LOW (ref >90)
GFR calc non Af Amer: 68 mL/min — ABNORMAL LOW (ref >90)
Glucose, Bld: 86 mg/dL (ref 70–99)
Potassium: 4.3 mEq/L (ref 3.7–5.3)
Sodium: 138 mEq/L (ref 137–147)
Total Bilirubin: 0.4 mg/dL (ref 0.3–1.2)
Total Protein: 5.9 g/dL — ABNORMAL LOW (ref 6.0–8.3)

## 2014-02-08 LAB — CBC WITH DIFFERENTIAL/PLATELET
Basophils Absolute: 0 10*3/uL (ref 0.0–0.1)
Basophils Relative: 0 % (ref 0–1)
Eosinophils Absolute: 0.1 10*3/uL (ref 0.0–0.7)
Eosinophils Relative: 1 % (ref 0–5)
HCT: 36.4 % — ABNORMAL LOW (ref 39.0–52.0)
HEMOGLOBIN: 12.7 g/dL — AB (ref 13.0–17.0)
LYMPHS PCT: 32 % (ref 12–46)
Lymphs Abs: 3.1 10*3/uL (ref 0.7–4.0)
MCH: 31.4 pg (ref 26.0–34.0)
MCHC: 34.9 g/dL (ref 30.0–36.0)
MCV: 90.1 fL (ref 78.0–100.0)
MONO ABS: 0.9 10*3/uL (ref 0.1–1.0)
MONOS PCT: 9 % (ref 3–12)
NEUTROS ABS: 5.6 10*3/uL (ref 1.7–7.7)
NEUTROS PCT: 58 % (ref 43–77)
Platelets: 211 10*3/uL (ref 150–400)
RBC: 4.04 MIL/uL — ABNORMAL LOW (ref 4.22–5.81)
RDW: 14.4 % (ref 11.5–15.5)
WBC: 9.7 10*3/uL (ref 4.0–10.5)

## 2014-02-08 LAB — LIPASE, BLOOD: LIPASE: 15 U/L (ref 11–59)

## 2014-02-08 MED ORDER — SODIUM CHLORIDE 0.9 % IV SOLN
INTRAVENOUS | Status: DC
  Administered 2014-02-08: 04:00:00 via INTRAVENOUS

## 2014-02-08 MED ORDER — MILK AND MOLASSES ENEMA
1.0000 | Freq: Once | RECTAL | Status: AC
  Administered 2014-02-08: 250 mL via RECTAL

## 2014-02-08 MED ORDER — FENTANYL CITRATE 0.05 MG/ML IJ SOLN
100.0000 ug | Freq: Once | INTRAMUSCULAR | Status: AC
  Administered 2014-02-08: 100 ug via INTRAVENOUS
  Filled 2014-02-08: qty 2

## 2014-02-08 MED ORDER — HYDROMORPHONE HCL 1 MG/ML IJ SOLN
2.0000 mg | INTRAMUSCULAR | Status: DC | PRN
  Administered 2014-02-08 – 2014-02-17 (×61): 2 mg via INTRAVENOUS
  Filled 2014-02-08 (×64): qty 2

## 2014-02-08 MED ORDER — ENOXAPARIN SODIUM 60 MG/0.6ML ~~LOC~~ SOLN
60.0000 mg | Freq: Every evening | SUBCUTANEOUS | Status: DC
  Administered 2014-02-08 – 2014-02-16 (×9): 60 mg via SUBCUTANEOUS
  Filled 2014-02-08 (×9): qty 0.6

## 2014-02-08 MED ORDER — IRBESARTAN 75 MG PO TABS
75.0000 mg | ORAL_TABLET | Freq: Every day | ORAL | Status: DC
  Administered 2014-02-11 – 2014-02-17 (×7): 75 mg via ORAL
  Filled 2014-02-08 (×7): qty 1

## 2014-02-08 MED ORDER — PROMETHAZINE HCL 25 MG/ML IJ SOLN
12.5000 mg | Freq: Once | INTRAMUSCULAR | Status: AC
  Administered 2014-02-08: 12.5 mg via INTRAVENOUS
  Filled 2014-02-08: qty 1

## 2014-02-08 MED ORDER — ONDANSETRON HCL 4 MG/2ML IJ SOLN
4.0000 mg | Freq: Once | INTRAMUSCULAR | Status: AC
  Administered 2014-02-08: 4 mg via INTRAVENOUS
  Filled 2014-02-08: qty 2

## 2014-02-08 MED ORDER — ALBUTEROL SULFATE (2.5 MG/3ML) 0.083% IN NEBU: 3.0000 mL | INHALATION_SOLUTION | Freq: Four times a day (QID) | RESPIRATORY_TRACT | Status: DC | PRN

## 2014-02-08 MED ORDER — ONDANSETRON HCL 4 MG/2ML IJ SOLN
4.0000 mg | Freq: Four times a day (QID) | INTRAMUSCULAR | Status: DC | PRN
  Administered 2014-02-08 – 2014-02-16 (×20): 4 mg via INTRAVENOUS
  Filled 2014-02-08 (×20): qty 2

## 2014-02-08 MED ORDER — HYDROCHLOROTHIAZIDE 12.5 MG PO CAPS
12.5000 mg | ORAL_CAPSULE | Freq: Every day | ORAL | Status: DC
  Administered 2014-02-11 – 2014-02-17 (×7): 12.5 mg via ORAL
  Filled 2014-02-08 (×7): qty 1

## 2014-02-08 MED ORDER — FENTANYL CITRATE 0.05 MG/ML IJ SOLN: 100.0000 ug | INTRAMUSCULAR | Status: DC | PRN

## 2014-02-08 MED ORDER — DEXTROSE IN LACTATED RINGERS 5 % IV SOLN
INTRAVENOUS | Status: DC
  Administered 2014-02-08 – 2014-02-16 (×17): via INTRAVENOUS

## 2014-02-08 MED ORDER — LORAZEPAM 2 MG/ML IJ SOLN
1.0000 mg | INTRAMUSCULAR | Status: DC | PRN
  Administered 2014-02-09 – 2014-02-16 (×13): 1 mg via INTRAVENOUS
  Filled 2014-02-08 (×13): qty 1

## 2014-02-08 MED ORDER — ACETAMINOPHEN 325 MG PO TABS
650.0000 mg | ORAL_TABLET | Freq: Four times a day (QID) | ORAL | Status: DC | PRN
Start: 2014-02-08 — End: 2014-02-17
  Filled 2014-02-08: qty 2

## 2014-02-08 MED ORDER — VALSARTAN-HYDROCHLOROTHIAZIDE 80-12.5 MG PO TABS: 1.0000 | ORAL_TABLET | Freq: Every morning | ORAL | Status: DC

## 2014-02-08 MED ORDER — PANTOPRAZOLE SODIUM 40 MG IV SOLR
40.0000 mg | Freq: Every day | INTRAVENOUS | Status: DC
  Administered 2014-02-08 – 2014-02-16 (×9): 40 mg via INTRAVENOUS
  Filled 2014-02-08 (×9): qty 40

## 2014-02-08 MED ORDER — IOHEXOL 300 MG/ML  SOLN
25.0000 mL | Freq: Once | INTRAMUSCULAR | Status: AC | PRN
  Administered 2014-02-08: 25 mL via ORAL

## 2014-02-08 MED ORDER — ACETAMINOPHEN 650 MG RE SUPP
650.0000 mg | Freq: Four times a day (QID) | RECTAL | Status: DC | PRN
Start: 2014-02-08 — End: 2014-02-17
  Filled 2014-02-08: qty 1

## 2014-02-08 MED ORDER — IOHEXOL 300 MG/ML  SOLN
100.0000 mL | Freq: Once | INTRAMUSCULAR | Status: AC | PRN
  Administered 2014-02-08: 100 mL via INTRAVENOUS

## 2014-02-08 MED ORDER — ONDANSETRON HCL 4 MG/2ML IJ SOLN: 4.0000 mg | Freq: Three times a day (TID) | INTRAMUSCULAR | Status: DC | PRN

## 2014-02-08 NOTE — ED Notes (Signed)
Patient states the pain is in the left upper abdomin. Patient also states he has had "slight shortness of breath"

## 2014-02-08 NOTE — ED Provider Notes (Signed)
CSN: [id number]     Arrival date & time 02/08/14  0208 History   First MD Initiated Contact with Patient 02/08/14 0306     Chief Complaint  Patient presents with  . Abdominal Pain     (Consider location/radiation/quality/duration/timing/severity/associated sxs/prior Treatment) HPI  This is a 63 year old male who developed abdominal pain yesterday morning which has gradually worsened. It is located in the left upper quadrant and is described as sharp. He rates it a 7 out of 10 at the present time. He states the pain is similar to prior bowel obstructions. It is worse with movement or deep breathing. It is been associated with nausea and vomiting since yesterday evening. He has had no diarrhea. His last bowel movement was 2 evening's ago and was loose but not liquid.  Past Medical History  Diagnosis Date  . Pulmonary embolism 02/2010  . Hypertension   . Osteoporosis   . Arthritis   . TIA (transient ischemic attack) 10/11  . ETOH abuse     quit 03/2010  . 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 Personal history of PE (pulmonary embolism) 10/01/2010  . Blood transfusion   . S/P endoscopy September 28, 2010    erosive reflux esophagitis, Billroth I anatomy  . Shortness of breath   . Sleep apnea   . Recurrent upper respiratory infection (URI)   . Anxiety   . Pneumonia   . Anemia   . Ileus   . Chronic abdominal pain   . GERD (gastroesophageal reflux disease)   . Chest pain at rest   . Erosive esophagitis   . Vitamin B12 deficiency   . Bowel obstruction 05/13/2012    Recurrent  . Hx of Clostridium difficile infection 01/2012  . Bronchitis   . 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 Hernia repair      right inguinal  . Appendectomy  1980s  . Cholecystectomy  1980s  . Portacath placement    . Abdominal sugery      for bowel obstruction x 8, all in 1980s, except for one in 07/2010  . Esophagogastroduodenoscopy   09/28/2010  . Esophagogastroduodenoscopy  12/01/2010    Cervical web status post dilation, erosive esophagitis, B1 hemigastrectomy, inflamed anastomosis  . Colonoscopy  03/18/2011    anastomosis at 35cm. Several adenomatous polyps removed. Sigmoid diverticulosis. Next TCS 02/2013  . Esophagogastroduodenoscopy  04/16/2011    excoriation at GEJ c/w trauma/M-W tear, friable gastric anastomosis, dilation efferent limb  . Billroth 1 hemigastrectomy  1980s    per patient for benign duodenal tumor  . Esophagogastroduodenoscopy (egd) with esophageal dilation  02/25/2012    NFA:[id number] esophageal web-s/p dilation anddisruption as described above. Status post prior gastric with Billroth I configuration. Abnormal gastric mucosa at the anastomosis. Gastric biopsy showed mild chronic inflammation but no H. pylori   . Colonoscopy N/A 07/24/2012    QIO:NGEXBM post segmental resection with normal-appearing colonic anastomosis aside from an adjacent polyp-removed as described above. Rectal polyp-removed as described above. CT findings appear to have been artifactual. tubular adenomas/prolapsed type polyp.  . Colon surgery  May 2012    left hemicolectomy, colon cancer found at time of surgery for bowel obstruction  . Cardiac catheterization  07/17/2012   Family History  Problem Relation Age of Onset  . Hypertension Mother   . Arthritis Mother   . Pneumonia Mother   . Hypertension Father   . Heart attack  Father    History  Substance Use Topics  . Smoking status: Former Smoker -- 0.50 packs/day for 40 years    Types: Cigarettes    Quit date: 12/20/2012  . Smokeless tobacco: Never Used     Comment: Quit x 1 year  . Alcohol Use: No     Comment: last drink was Apr 16 2010    Review of Systems  All other systems reviewed and are negative.   Allergies  Review of patient's allergies indicates no known allergies.  Home Medications   Prior to Admission medications   Medication Sig Start Date End Date  Taking? Authorizing Provider  alendronate (FOSAMAX) 70 MG tablet Take 70 mg by mouth every 7 (seven) days. On sundays 10/14/11  Yes Historical Provider, MD  butalbital-acetaminophen-caffeine (FIORICET, ESGIC) 50-325-40 MG per tablet 1 tablet 3 (three) times daily as needed for headache or migraine.  04/10/13  Yes Historical Provider, MD  cholecalciferol (VITAMIN D) 1000 UNITS tablet Take 1,000 Units by mouth every morning.   Yes Historical Provider, MD  cyanocobalamin (,VITAMIN B-12,) 1000 MCG/ML injection Inject 1,000 mcg into the muscle every 30 (thirty) days.   Yes Historical Provider, MD  cyclobenzaprine (FLEXERIL) 10 MG tablet Take 10 mg by mouth daily as needed for muscle spasms.  11/18/11  Yes Historical Provider, MD  dexlansoprazole (DEXILANT) 60 MG capsule Take 60 mg by mouth every morning. 03/22/11  Yes Mahala Menghini, PA-C  dicyclomine (BENTYL) 20 MG tablet Take 1 tablet by mouth daily as needed for spasms.  04/24/13  Yes Historical Provider, MD  docusate sodium (COLACE) 100 MG capsule Take 100 mg by mouth 2 (two) times daily.   Yes Historical Provider, MD  enoxaparin (LOVENOX) 60 MG/0.6ML injection Inject 60 mg into the skin every evening.    Yes Historical Provider, MD  ENSURE PLUS (ENSURE PLUS) LIQD Take 237 mLs by mouth daily. Every other day   Yes Historical Provider, MD  ferrous [id number] C-folic acid (FEROCON) capsule Take 1 capsule by mouth daily.   Yes Historical Provider, MD  ferrous sulfate 325 (65 FE) MG tablet Take 325 mg by mouth daily with breakfast.   Yes Historical Provider, MD  folic acid (FOLVITE) 1 MG tablet Take 1 mg by mouth every morning.    Yes Historical Provider, MD  LORazepam (ATIVAN) 1 MG tablet Take 1 mg by mouth every 4 (four) hours as needed. anxiety   Yes Historical Provider, MD  Melatonin 10 MG CAPS Take 10 mg by mouth at bedtime.   Yes Historical Provider, MD  ondansetron (ZOFRAN) 4 MG tablet Take 4 mg by mouth daily as needed for nausea.   Yes  Historical Provider, MD  polyethylene glycol powder (GLYCOLAX/MIRALAX) powder Take 17 g by mouth as needed. For constipation   Yes Historical Provider, MD  prochlorperazine (COMPAZINE) 10 MG tablet Take 10 mg by mouth every 6 (six) hours as needed. Nausea and Vomiting   Yes Historical Provider, MD  psyllium (REGULOID) 0.52 G capsule Take 2.6 g by mouth daily. *5 capsules taken twice daily*   Yes Historical Provider, MD  sertraline (ZOLOFT) 100 MG tablet Take 150 mg by mouth at bedtime.    Yes Historical Provider, MD  simvastatin (ZOCOR) 10 MG tablet Take 10 mg by mouth daily.   Yes Historical Provider, MD  thiamine 100 MG tablet Take 100 mg by mouth every morning.    Yes Historical Provider, MD  traMADol (ULTRAM) 50 MG tablet Take 50 mg by mouth every  6 (six) hours as needed for moderate pain or severe pain.    Yes Historical Provider, MD  valsartan-hydrochlorothiazide (DIOVAN-HCT) 80-12.5 MG per tablet Take 1 tablet by mouth every morning.    Yes Historical Provider, MD  Vitamin D, Ergocalciferol, (DRISDOL) 50000 UNITS CAPS Take 50,000 Units by mouth once a week. Takes on Sunday 03/13/12  Yes Historical Provider, MD  Wheat Dextrin (BENEFIBER PO) Take 1 each by mouth daily as needed (to regulate bowel movements/for supplement).    Yes Historical Provider, MD  albuterol (PROAIR HFA) 108 (90 BASE) MCG/ACT inhaler Inhale into the lungs every 6 (six) hours as needed for wheezing or shortness of breath.    Historical Provider, MD  diphenoxylate-atropine (LOMOTIL) 2.5-0.025 MG per tablet Take 1 tablet by mouth 2 (two) times daily. As needed 04/10/13   Historical Provider, MD  sucralfate (CARAFATE) 1 GM/10ML suspension Take 10 mLs (1 g total) by mouth 4 (four) times daily. As needed 05/01/13   Mahala Menghini, PA-C   BP 133/89 mmHg  Pulse 94  Temp(Src) 97.7 F (36.5 C) (Oral)  Resp 17  Ht 5\' 9"  (1.753 m)  Wt 140 lb (63.504 kg)  BMI 20.67 kg/m2  SpO2 98%   Physical Exam  General: Well-developed,  well-nourished male in no acute distress; appearance consistent with age of record HENT: normocephalic; atraumatic Eyes: pupils equal, round and reactive to light; extraocular muscles intact Neck: supple Heart: regular rate and rhythm Lungs: clear to auscultation bilaterally Abdomen: soft; nondistended; tenderness in the extreme left upper quadrant; no masses or hepatosplenomegaly; bowel sounds present Extremities: No deformity; full range of motion; pulses normal Neurologic: Awake, alert and oriented; motor function intact in all extremities and symmetric; no facial droop Skin: Warm and dry Psychiatric: Flat affect    ED Course  Procedures (including critical care time)   MDM  Nursing notes and vitals signs, including pulse oximetry, reviewed.  Summary of this visit's results, reviewed by myself:  Labs:  Results for orders placed or performed during the hospital encounter of 02/08/14 (from the past 24 hour(s))  Comprehensive metabolic panel     Status: Abnormal   Collection Time: 02/08/14  3:32 AM  Result Value Ref Range   Sodium 138 137 - 147 mEq/L   Potassium 4.3 3.7 - 5.3 mEq/L   Chloride 100 96 - 112 mEq/L   CO2 25 19 - 32 mEq/L   Glucose, Bld 86 70 - 99 mg/dL   BUN 11 6 - 23 mg/dL   Creatinine, Ser 1.12 0.50 - 1.35 mg/dL   Calcium 9.2 8.4 - 10.5 mg/dL   Total Protein 5.9 (L) 6.0 - 8.3 g/dL   Albumin 3.2 (L) 3.5 - 5.2 g/dL   AST 24 0 - 37 U/L   ALT 16 0 - 53 U/L   Alkaline Phosphatase 76 39 - 117 U/L   Total Bilirubin 0.4 0.3 - 1.2 mg/dL   GFR calc non Af Amer 68 (L) >90 mL/min   GFR calc Af Amer 79 (L) >90 mL/min   Anion gap 13 5 - 15  Lipase, blood     Status: None   Collection Time: 02/08/14  3:32 AM  Result Value Ref Range   Lipase 15 11 - 59 U/L  CBC with Differential     Status: Abnormal   Collection Time: 02/08/14  3:32 AM  Result Value Ref Range   WBC 9.7 4.0 - 10.5 K/uL   RBC 4.04 (L) 4.22 - 5.81 MIL/uL   Hemoglobin  12.7 (L) 13.0 - 17.0 g/dL   HCT  36.4 (L) 39.0 - 52.0 %   MCV 90.1 78.0 - 100.0 fL   MCH 31.4 26.0 - 34.0 pg   MCHC 34.9 30.0 - 36.0 g/dL   RDW 14.4 11.5 - 15.5 %   Platelets 211 150 - 400 K/uL   Neutrophils Relative % 58 43 - 77 %   Neutro Abs 5.6 1.7 - 7.7 K/uL   Lymphocytes Relative 32 12 - 46 %   Lymphs Abs 3.1 0.7 - 4.0 K/uL   Monocytes Relative 9 3 - 12 %   Monocytes Absolute 0.9 0.1 - 1.0 K/uL   Eosinophils Relative 1 0 - 5 %   Eosinophils Absolute 0.1 0.0 - 0.7 K/uL   Basophils Relative 0 0 - 1 %   Basophils Absolute 0.0 0.0 - 0.1 K/uL    Imaging Studies: Ct Abdomen Pelvis W Contrast  02/08/2014   CLINICAL DATA:  Left upper quadrant pain, nausea and vomiting. History of colon cancer with left hemicolectomy, partial gastrectomy, appendectomy, and cholecystectomy.  EXAM: CT ABDOMEN AND PELVIS WITH CONTRAST  TECHNIQUE: Multidetector CT imaging of the abdomen and pelvis was performed using the standard protocol following bolus administration of intravenous contrast.  CONTRAST:  64mL OMNIPAQUE IOHEXOL 300 MG/ML SOLN, 131mL OMNIPAQUE IOHEXOL 300 MG/ML SOLN  COMPARISON:  09/17/2013  FINDINGS: Surgical absence of the gallbladder. Mild diffuse fatty infiltration in the liver. The spleen, pancreas, adrenal glands, abdominal aorta, inferior vena cava, and retroperitoneal lymph nodes are unremarkable. Bilateral renal cysts. No hydronephrosis or hydroureter. No free air or free fluid in the abdomen. Complex appearance of the bowel. There is a partial gastrectomy with apparent reanastomosis to the duodenum. Duodenum is decompressed and contrast material flows through the duodenum. Proximal jejunum is distended with a few air-fluid levels. Transition zone is detected in the left lower quadrant with decompressed small bowel distally. There may be skip lesions with another segment of central small bowel which appears to be more gas distended and again a transition zone to decompressed terminal ileum. Findings suggest multiple areas of  partial obstruction to the small bowel, likely due to adhesions in this patient with multiple bowel surgeries. The rectum is diffusely stool-filled consistent with constipation with anastomosis demonstrated to a dilated portion of colon. Segment of the transverse colon appears to demonstrate wall thickening which may indicate focal colitis.  Pelvis: Prostate gland is enlarged, measuring 5.4 x 4.1 cm. Bladder wall is not thickened. No free or loculated pelvic fluid collections.  IMPRESSION: Changes consistent with partial small bowel obstruction with possibly 2 sites of narrowing in the jejunum and ileum. Changes are likely due to adhesions in this patient with multiple previous surgeries. Large amount of stool in the rectum consistent with constipation with distended colon at the rectosigmoid anastomosis. Thick-walled loops with transverse colon may indicate focal colitis.  These results were discussed by telephone at the time of interpretation on 02/08/2014 at 6:25 am to Dr. Shanon Rosser , who verbally acknowledged these results.   Electronically Signed   By: Lucienne Capers M.D.   On: 02/08/2014 06:26   Dg Abd Acute W/chest  02/08/2014   CLINICAL DATA:  LEFT upper quadrant pain beginning yesterday, nausea vomiting. History of colon cancer.  EXAM: ACUTE ABDOMEN SERIES (ABDOMEN 2 VIEW & CHEST 1 VIEW)  COMPARISON:  CT of the abdomen and pelvis September 17, 2013  FINDINGS: Cardiac silhouette is unremarkable. Mildly tortuous aorta. No pleural effusions or focal consolidations. Single  lumen LEFT chest Port-A-Cath with distal tip projecting at the superior vena cava. Patient's osteopenia. Soft tissue planes are unremarkable.  Multiple loops of mildly gas distended predominantly large bowel, difficult to measure due to redundant bowel. No intra-abdominal mass effect. Mild amount of retained large bowel stool. Surgical clips in the included right abdomen likely reflect cholecystectomy. Surgical clips and staples in the  LEFT upper quadrant.  IMPRESSION: No acute cardiopulmonary process.  Multiple loops of gas distended bowel, could reflect ileus or possible distal large bowel obstruction. Recommend followup.   Electronically Signed   By: Elon Alas   On: 02/08/2014 04:40   Dr. Arnoldo Morale to admit.   Wynetta Fines, MD 02/08/14 [phone]

## 2014-02-08 NOTE — Progress Notes (Signed)
INITIAL NUTRITION ASSESSMENT  DOCUMENTATION CODES Per approved criteria  -Not Applicable   INTERVENTION: Follow for diet advancement as appropriate  NUTRITION DIAGNOSIS:  Inadequate oral intake related to partial small bowel obstruction as evidenced by NPO status.  Goal: Pt to meet >/= 90% of their estimated nutrition needs      Monitor: Meal and supplement percent intake, labs and wt trends     Reason for Assessment: Malnutrition Screen   63 y.o. male   ASSESSMENT: Pt presents with nausea and constipation. CT indicates partial small bowel obstruction therefore his is currently NPO. His weight hx is stable. No physical signs of malnutrition observed at this time.  Height: Ht Readings from Last 1 Encounters:  02/08/14 5\' 9"  (1.753 m)    Weight: Wt Readings from Last 1 Encounters:  02/08/14 140 lb (63.504 kg)    Ideal Body Weight: 160# (72.7)  % Ideal Body Weight: 88%  Wt Readings from Last 10 Encounters:  02/08/14 140 lb (63.504 kg)  01/04/14 140 lb 12.8 oz (63.866 kg)  12/11/13 140 lb 6.4 oz (63.685 kg)  11/23/13 136 lb 9.6 oz (61.961 kg)  09/23/13 138 lb 11.2 oz (62.914 kg)  08/23/13 144 lb 6.4 oz (65.499 kg)  05/31/13 141 lb 9.6 oz (64.229 kg)  05/01/13 144 lb 3.2 oz (65.409 kg)  03/08/13 142 lb 11.2 oz (64.728 kg)  11/24/12 137 lb 14.4 oz (62.551 kg)    Usual Body Weight: 140#  % Usual Body Weight: 100%   BMI:  Body mass index is 20.67 kg/(m^2). normal range  Estimated Nutritional Needs: Kcal: 1825-2200 Protein: 85-95 gr Fluid: 1.8-2.2 liters daily  Skin: intact  Diet Order: Diet NPO time specified Except for: Ice Chips  EDUCATION NEEDS: -No education needs identified at this time   Intake/Output Summary (Last 24 hours) at 02/08/14 0949 Last data filed at 02/08/14 0646  Gross per 24 hour  Intake      0 ml  Output   1000 ml  Net  -1000 ml    Last BM: PTA  Labs:   Recent Labs Lab 02/08/14 0332  NA 138  K 4.3  CL 100  CO2 25   BUN 11  CREATININE 1.12  CALCIUM 9.2  GLUCOSE 86    CBG (last 3)  No results for input(s): GLUCAP in the last 72 hours.  Scheduled Meds: . enoxaparin  60 mg Subcutaneous QPM  . irbesartan  75 mg Oral Daily   And  . hydrochlorothiazide  12.5 mg Oral Daily  . milk and molasses  1 enema Rectal Once  . pantoprazole (PROTONIX) IV  40 mg Intravenous QHS    Continuous Infusions: . dextrose 5% lactated ringers 100 mL/hr at 02/08/14 8144    Past Medical History  Diagnosis Date  . Pulmonary embolism 02/2010  . Hypertension   . Osteoporosis   . Arthritis   . TIA (transient ischemic attack) 10/11  . ETOH abuse     quit 03/2010  . 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 Personal history of PE (pulmonary embolism) 10/01/2010  . Blood transfusion   . S/P endoscopy September 28, 2010    erosive reflux esophagitis, Billroth I anatomy  . Shortness of breath   . Sleep apnea   . Recurrent upper respiratory infection (URI)   . Anxiety   . Pneumonia   . Anemia   . Ileus   . Chronic abdominal pain   . GERD (gastroesophageal reflux disease)   . Chest pain at rest   .  Erosive esophagitis   . Vitamin B12 deficiency   . Bowel obstruction 05/13/2012    Recurrent  . Hx of Clostridium difficile infection 01/2012  . Bronchitis   . 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 Hernia repair      right inguinal  . Appendectomy  1980s  . Cholecystectomy  1980s  . Portacath placement    . Abdominal sugery      for bowel obstruction x 8, all in 1980s, except for one in 07/2010  . Esophagogastroduodenoscopy  09/28/2010  . Esophagogastroduodenoscopy  12/01/2010    Cervical web status post dilation, erosive esophagitis, B1 hemigastrectomy, inflamed anastomosis  . Colonoscopy  03/18/2011    anastomosis at 35cm. Several adenomatous polyps removed. Sigmoid diverticulosis. Next TCS 02/2013  . Esophagogastroduodenoscopy  04/16/2011     excoriation at GEJ c/w trauma/M-W tear, friable gastric anastomosis, dilation efferent limb  . Billroth 1 hemigastrectomy  1980s    per patient for benign duodenal tumor  . Esophagogastroduodenoscopy (egd) with esophageal dilation  02/25/2012    [id number] esophageal web-s/p dilation anddisruption as described above. Status post prior gastric with Billroth I configuration. Abnormal gastric mucosa at the anastomosis. Gastric biopsy showed mild chronic inflammation but no H. pylori   . Colonoscopy N/A 07/24/2012    TGP:[id number] post segmental resection with normal-appearing colonic anastomosis aside from an adjacent polyp-removed as described above. Rectal polyp-removed as described above. CT findings appear to have been artifactual. tubular adenomas/prolapsed type polyp.  . Colon surgery  May 2012    left hemicolectomy, colon cancer found at time of surgery for bowel obstruction  . Cardiac catheterization  07/17/2012    Colman Cater MS,RD,CSG,LDN Office: #[phone] Pager: [phone]

## 2014-02-08 NOTE — ED Notes (Signed)
Attempted to give report. Floor said nurse would call back to get report.

## 2014-02-08 NOTE — Care Management Note (Addendum)
    Page 1 of 1   02/15/2014     2:30:51 PM CARE MANAGEMENT NOTE 02/15/2014  Patient:  [name], [name]   Account Number:  [id number]  Date Initiated:  02/08/2014  Documentation initiated by:  Jolene Provost  Subjective/Objective Assessment:   Pt is from home with self care. Pt is independent with ADL's has no HH services, DME's or medication needs prior to admission.     Action/Plan:   Pt plans to discharge home with self care. No CM needs at this time.   Anticipated DC Date:  02/11/2014   Anticipated DC Plan:  [place]  CM consult      Choice offered to / List presented to:             Status of service:  Completed, signed off Medicare Important Message given?   (If response is "NO", the following Medicare IM given date fields will be blank) Date Medicare IM given:   Medicare IM given by:   Date Additional Medicare IM given:   Additional Medicare IM given by:    Discharge Disposition:  HOME/SELF CARE  Per UR Regulation:    If discussed at Long Length of Stay Meetings, dates discussed:    Comments:  02/15/2014 [place], RN, MSN, [facility] Plan for discharge over weekend. No CM needs at this time.  02/08/2014 [place], RN, MSN, [facility]

## 2014-02-08 NOTE — Progress Notes (Signed)
Patient given milk and molasses enema 234ml patient able to retain enema for 20 minutes . Fair results noted. Patient tolerated enema well.

## 2014-02-08 NOTE — Plan of Care (Signed)
Problem: Phase I Progression Outcomes Goal: Pain controlled with appropriate interventions Outcome: Progressing Medicated as needed with IV pain medication

## 2014-02-08 NOTE — Care Management Utilization Note (Signed)
UR review complete.  

## 2014-02-08 NOTE — H&P (Signed)
[name] is an 63 y.o. male.   Chief Complaint: Nausea and constipation HPI: Patient is a 63 year old white male with multiple medical problems, status post multiple abdominal surgeries in the past and admissions for partial bowel obstructions who presents with recurrent nausea and abdominal pain. He states it started yesterday. He had a small bowel movement several days ago. He presented to the emergency room for further evaluation treatment. CT scan of the abdomen and pelvis reveals a partial small bowel obstruction secondary to adhesive disease with significant constipation present.  Past Medical History  Diagnosis Date  . Pulmonary embolism 02/2010  . Hypertension   . Osteoporosis   . Arthritis   . TIA (transient ischemic attack) 10/11  . ETOH abuse     quit 03/2010  . 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 Personal history of PE (pulmonary embolism) 10/01/2010  . Blood transfusion   . S/P endoscopy September 28, 2010    erosive reflux esophagitis, Billroth I anatomy  . Shortness of breath   . Sleep apnea   . Recurrent upper respiratory infection (URI)   . Anxiety   . Pneumonia   . Anemia   . Ileus   . Chronic abdominal pain   . GERD (gastroesophageal reflux disease)   . Chest pain at rest   . Erosive esophagitis   . Vitamin B12 deficiency   . Bowel obstruction 05/13/2012    Recurrent  . Hx of Clostridium difficile infection 01/2012  . Bronchitis   . 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 Hernia repair      right inguinal  . Appendectomy  1980s  . Cholecystectomy  1980s  . Portacath placement    . Abdominal sugery      for bowel obstruction x 8, all in 1980s, except for one in 07/2010  . Esophagogastroduodenoscopy  09/28/2010  . Esophagogastroduodenoscopy  12/01/2010    Cervical web status post dilation, erosive esophagitis, B1 hemigastrectomy, inflamed anastomosis  . Colonoscopy  03/18/2011    anastomosis at 35cm. Several adenomatous polyps removed. Sigmoid diverticulosis. Next TCS 02/2013  . Esophagogastroduodenoscopy  04/16/2011    excoriation at GEJ c/w trauma/M-W tear, friable gastric anastomosis, dilation efferent limb  . Billroth 1 hemigastrectomy  1980s    per patient for benign duodenal tumor  . Esophagogastroduodenoscopy (egd) with esophageal dilation  02/25/2012    DGU:[id number] esophageal web-s/p dilation anddisruption as described above. Status post prior gastric with Billroth I configuration. Abnormal gastric mucosa at the anastomosis. Gastric biopsy showed mild chronic inflammation but no H. pylori   . Colonoscopy N/A 07/24/2012    VZD:GLOVFI post segmental resection with normal-appearing colonic anastomosis aside from an adjacent polyp-removed as described above. Rectal polyp-removed as described above. CT findings appear to have been artifactual. tubular adenomas/prolapsed type polyp.  . Colon surgery  May 2012    left hemicolectomy, colon cancer found at time of surgery for bowel obstruction  . Cardiac catheterization  07/17/2012    Family History  Problem Relation Age of Onset  . Hypertension Mother   . Arthritis Mother   . Pneumonia Mother   . Hypertension Father   . Heart attack Father    Social History:  reports that he quit smoking about 13 months ago. His smoking use included Cigarettes. He has a 20 pack-year smoking history. He has never used smokeless tobacco. He reports that he does not drink alcohol  or use illicit drugs.  Allergies: No Known Allergies   (Not in a hospital admission)  Results for orders placed or performed during the hospital encounter of 02/08/14 (from the past 48 hour(s))  Comprehensive metabolic panel     Status: Abnormal   Collection Time: 02/08/14  3:32 AM  Result Value Ref Range   Sodium 138 137 - 147 mEq/L   Potassium 4.3 3.7 - 5.3 mEq/L   Chloride 100 96 - 112 mEq/L   CO2 25 19 - 32 mEq/L   Glucose, Bld 86 70 - 99 mg/dL    BUN 11 6 - 23 mg/dL   Creatinine, Ser 1.12 0.50 - 1.35 mg/dL   Calcium 9.2 8.4 - 10.5 mg/dL   Total Protein 5.9 (L) 6.0 - 8.3 g/dL   Albumin 3.2 (L) 3.5 - 5.2 g/dL   AST 24 0 - 37 U/L   ALT 16 0 - 53 U/L   Alkaline Phosphatase 76 39 - 117 U/L   Total Bilirubin 0.4 0.3 - 1.2 mg/dL   GFR calc non Af Amer 68 (L) >90 mL/min   GFR calc Af Amer 79 (L) >90 mL/min    Comment: (NOTE) The eGFR has been calculated using the CKD EPI equation. This calculation has not been validated in all clinical situations. eGFR's persistently <90 mL/min signify possible Chronic Kidney Disease.    Anion gap 13 5 - 15  Lipase, blood     Status: None   Collection Time: 02/08/14  3:32 AM  Result Value Ref Range   Lipase 15 11 - 59 U/L  CBC with Differential     Status: Abnormal   Collection Time: 02/08/14  3:32 AM  Result Value Ref Range   WBC 9.7 4.0 - 10.5 K/uL   RBC 4.04 (L) 4.22 - 5.81 MIL/uL   Hemoglobin 12.7 (L) 13.0 - 17.0 g/dL   HCT 36.4 (L) 39.0 - 52.0 %   MCV 90.1 78.0 - 100.0 fL   MCH 31.4 26.0 - 34.0 pg   MCHC 34.9 30.0 - 36.0 g/dL   RDW 14.4 11.5 - 15.5 %   Platelets 211 150 - 400 K/uL   Neutrophils Relative % 58 43 - 77 %   Neutro Abs 5.6 1.7 - 7.7 K/uL   Lymphocytes Relative 32 12 - 46 %   Lymphs Abs 3.1 0.7 - 4.0 K/uL   Monocytes Relative 9 3 - 12 %   Monocytes Absolute 0.9 0.1 - 1.0 K/uL   Eosinophils Relative 1 0 - 5 %   Eosinophils Absolute 0.1 0.0 - 0.7 K/uL   Basophils Relative 0 0 - 1 %   Basophils Absolute 0.0 0.0 - 0.1 K/uL   Ct Abdomen Pelvis W Contrast  02/08/2014   CLINICAL DATA:  Left upper quadrant pain, nausea and vomiting. History of colon cancer with left hemicolectomy, partial gastrectomy, appendectomy, and cholecystectomy.  EXAM: CT ABDOMEN AND PELVIS WITH CONTRAST  TECHNIQUE: Multidetector CT imaging of the abdomen and pelvis was performed using the standard protocol following bolus administration of intravenous contrast.  CONTRAST:  53m OMNIPAQUE IOHEXOL 300  MG/ML SOLN, 1034mOMNIPAQUE IOHEXOL 300 MG/ML SOLN  COMPARISON:  09/17/2013  FINDINGS: Surgical absence of the gallbladder. Mild diffuse fatty infiltration in the liver. The spleen, pancreas, adrenal glands, abdominal aorta, inferior vena cava, and retroperitoneal lymph nodes are unremarkable. Bilateral renal cysts. No hydronephrosis or hydroureter. No free air or free fluid in the abdomen. Complex appearance of the bowel. There is a partial gastrectomy with  apparent reanastomosis to the duodenum. Duodenum is decompressed and contrast material flows through the duodenum. Proximal jejunum is distended with a few air-fluid levels. Transition zone is detected in the left lower quadrant with decompressed small bowel distally. There may be skip lesions with another segment of central small bowel which appears to be more gas distended and again a transition zone to decompressed terminal ileum. Findings suggest multiple areas of partial obstruction to the small bowel, likely due to adhesions in this patient with multiple bowel surgeries. The rectum is diffusely stool-filled consistent with constipation with anastomosis demonstrated to a dilated portion of colon. Segment of the transverse colon appears to demonstrate wall thickening which may indicate focal colitis.  Pelvis: Prostate gland is enlarged, measuring 5.4 x 4.1 cm. Bladder wall is not thickened. No free or loculated pelvic fluid collections.  IMPRESSION: Changes consistent with partial small bowel obstruction with possibly 2 sites of narrowing in the jejunum and ileum. Changes are likely due to adhesions in this patient with multiple previous surgeries. Large amount of stool in the rectum consistent with constipation with distended colon at the rectosigmoid anastomosis. Thick-walled loops with transverse colon may indicate focal colitis.  These results were discussed by telephone at the time of interpretation on 02/08/2014 at 6:25 am to Dr. Shanon Rosser , who  verbally acknowledged these results.   Electronically Signed   By: Lucienne Capers M.D.   On: 02/08/2014 06:26   Dg Abd Acute W/chest  02/08/2014   CLINICAL DATA:  LEFT upper quadrant pain beginning yesterday, nausea vomiting. History of colon cancer.  EXAM: ACUTE ABDOMEN SERIES (ABDOMEN 2 VIEW & CHEST 1 VIEW)  COMPARISON:  CT of the abdomen and pelvis September 17, 2013  FINDINGS: Cardiac silhouette is unremarkable. Mildly tortuous aorta. No pleural effusions or focal consolidations. Single lumen LEFT chest Port-A-Cath with distal tip projecting at the superior vena cava. Patient's osteopenia. Soft tissue planes are unremarkable.  Multiple loops of mildly gas distended predominantly large bowel, difficult to measure due to redundant bowel. No intra-abdominal mass effect. Mild amount of retained large bowel stool. Surgical clips in the included right abdomen likely reflect cholecystectomy. Surgical clips and staples in the LEFT upper quadrant.  IMPRESSION: No acute cardiopulmonary process.  Multiple loops of gas distended bowel, could reflect ileus or possible distal large bowel obstruction. Recommend followup.   Electronically Signed   By: Elon Alas   On: 02/08/2014 04:40    Review of Systems  Constitutional: Positive for malaise/fatigue.  HENT: Negative.   Eyes: Negative.   Respiratory: Negative.   Cardiovascular: Negative.   Gastrointestinal: Positive for nausea, abdominal pain and constipation.  Genitourinary: Negative.   Musculoskeletal: Negative.   Skin: Negative.     Blood pressure 142/94, pulse 88, temperature 97.7 F (36.5 C), temperature source Oral, resp. rate 19, height $RemoveBe'5\' 9"'ENJErgsOf$  (1.753 m), weight 63.504 kg (140 lb), SpO2 96 %. Physical Exam  Constitutional: He is oriented to person, place, and time. He appears well-developed and well-nourished.  HENT:  Head: Normocephalic and atraumatic.  Neck: Normal range of motion. Neck supple.  Cardiovascular: Normal rate, regular rhythm  and normal heart sounds.   Respiratory: Effort normal and breath sounds normal.  GI: Soft. He exhibits distension. There is tenderness. There is no rebound.  Soft. Distention present but not tense. Occasional bowel sounds appreciated. No hernias appreciated.  Neurological: He is alert and oriented to person, place, and time.  Skin: Skin is warm and dry.     Assessment/Plan  Impression: Recurrent small bowel obstruction with constipation, dysfunctional bowel Plan: Will admit patient to the hospital for NG tube decompression and control of his pain and nausea. He will also receive enemas due to his constipation. As in the past, patient is a high risk surgical candidate and we'll try to avoid any surgical intervention for his adhesive disease. He placed his own NG tube. We will monitor expectantly.  Damel Querry A 02/08/2014, 8:22 AM

## 2014-02-08 NOTE — ED Notes (Signed)
Second attempt made to contact nurse. Per Secretary, nurse talking with family members pertaining a pt.

## 2014-02-08 NOTE — Plan of Care (Signed)
Problem: Phase I Progression Outcomes Goal: OOB as tolerated unless otherwise ordered Outcome: Progressing Patient ambulated in hallway

## 2014-02-08 NOTE — ED Notes (Signed)
Patient states abdominal pain with nausea and vomiting. Patient states emesis x3.

## 2014-02-09 MED ORDER — MILK AND MOLASSES ENEMA
1.0000 | Freq: Once | RECTAL | Status: AC
  Administered 2014-02-09: 250 mL via RECTAL

## 2014-02-09 NOTE — Plan of Care (Signed)
Problem: Phase I Progression Outcomes Goal: Pain controlled with appropriate interventions Outcome: Progressing Medicated as needed for pain

## 2014-02-09 NOTE — Plan of Care (Signed)
Problem: Phase I Progression Outcomes Goal: Voiding-avoid urinary catheter unless indicated Outcome: Completed/Met Date Met:  02/09/14 Patient voiding

## 2014-02-09 NOTE — Plan of Care (Signed)
Problem: Phase I Progression Outcomes Goal: Hemodynamically stable Outcome: Progressing NG tube intact to low intermitten suction . IV fluids infusing remain NPO.

## 2014-02-09 NOTE — Progress Notes (Signed)
Patient given milk and molasses enema by Nurse Tech fair results noted. Small BM noted.

## 2014-02-09 NOTE — Progress Notes (Signed)
Patient c/o feeling nervous medicated with Ativan IV at patient request.

## 2014-02-09 NOTE — Progress Notes (Signed)
Subjective: Still some nausea. Abdomen feels slightly distended to the patient. Did have some results with enema.  Objective: Vital signs in last 24 hours: Temp:  [97.8 F (36.6 C)-98.4 F (36.9 C)] 98.4 F (36.9 C) (11/21 0653) Pulse Rate:  [78-107] 107 (11/21 0653) Resp:  [20] 20 (11/21 0653) BP: (106-155)/(71-91) 106/71 mmHg (11/21 0653) SpO2:  [98 %-100 %] 100 % (11/21 0653) Weight:  [62.596 kg (138 lb)] 62.596 kg (138 lb) (11/20 1326) Last BM Date: 02/08/14  Intake/Output from previous day: 11/20 0701 - 11/21 0700 In: 375 [NG/GT:375] Out: 902 [Urine:901; Stool:1] Intake/Output this shift:    General appearance: alert, cooperative and no distress Resp: clear to auscultation bilaterally Cardio: regular rate and rhythm, S1, S2 normal, no murmur, click, rub or gallop GI: Soft, but mildly distended. No rigidity noted. Occasional bowel sounds appreciated.  Lab Results:   Recent Labs  02/08/14 0332  WBC 9.7  HGB 12.7*  HCT 36.4*  PLT 211   BMET  Recent Labs  02/08/14 0332  NA 138  K 4.3  CL 100  CO2 25  GLUCOSE 86  BUN 11  CREATININE 1.12  CALCIUM 9.2   PT/INR No results for input(s): LABPROT, INR in the last 72 hours.  Studies/Results: Ct Abdomen Pelvis W Contrast  02/08/2014   CLINICAL DATA:  Left upper quadrant pain, nausea and vomiting. History of colon cancer with left hemicolectomy, partial gastrectomy, appendectomy, and cholecystectomy.  EXAM: CT ABDOMEN AND PELVIS WITH CONTRAST  TECHNIQUE: Multidetector CT imaging of the abdomen and pelvis was performed using the standard protocol following bolus administration of intravenous contrast.  CONTRAST:  29mL OMNIPAQUE IOHEXOL 300 MG/ML SOLN, 160mL OMNIPAQUE IOHEXOL 300 MG/ML SOLN  COMPARISON:  09/17/2013  FINDINGS: Surgical absence of the gallbladder. Mild diffuse fatty infiltration in the liver. The spleen, pancreas, adrenal glands, abdominal aorta, inferior vena cava, and retroperitoneal lymph nodes are  unremarkable. Bilateral renal cysts. No hydronephrosis or hydroureter. No free air or free fluid in the abdomen. Complex appearance of the bowel. There is a partial gastrectomy with apparent reanastomosis to the duodenum. Duodenum is decompressed and contrast material flows through the duodenum. Proximal jejunum is distended with a few air-fluid levels. Transition zone is detected in the left lower quadrant with decompressed small bowel distally. There may be skip lesions with another segment of central small bowel which appears to be more gas distended and again a transition zone to decompressed terminal ileum. Findings suggest multiple areas of partial obstruction to the small bowel, likely due to adhesions in this patient with multiple bowel surgeries. The rectum is diffusely stool-filled consistent with constipation with anastomosis demonstrated to a dilated portion of colon. Segment of the transverse colon appears to demonstrate wall thickening which may indicate focal colitis.  Pelvis: Prostate gland is enlarged, measuring 5.4 x 4.1 cm. Bladder wall is not thickened. No free or loculated pelvic fluid collections.  IMPRESSION: Changes consistent with partial small bowel obstruction with possibly 2 sites of narrowing in the jejunum and ileum. Changes are likely due to adhesions in this patient with multiple previous surgeries. Large amount of stool in the rectum consistent with constipation with distended colon at the rectosigmoid anastomosis. Thick-walled loops with transverse colon may indicate focal colitis.  These results were discussed by telephone at the time of interpretation on 02/08/2014 at 6:25 am to Dr. Shanon Rosser , who verbally acknowledged these results.   Electronically Signed   By: Lucienne Capers M.D.   On: 02/08/2014 06:26  Dg Abd Acute W/chest  02/08/2014   CLINICAL DATA:  LEFT upper quadrant pain beginning yesterday, nausea vomiting. History of colon cancer.  EXAM: ACUTE ABDOMEN SERIES  (ABDOMEN 2 VIEW & CHEST 1 VIEW)  COMPARISON:  CT of the abdomen and pelvis September 17, 2013  FINDINGS: Cardiac silhouette is unremarkable. Mildly tortuous aorta. No pleural effusions or focal consolidations. Single lumen LEFT chest Port-A-Cath with distal tip projecting at the superior vena cava. Patient's osteopenia. Soft tissue planes are unremarkable.  Multiple loops of mildly gas distended predominantly large bowel, difficult to measure due to redundant bowel. No intra-abdominal mass effect. Mild amount of retained large bowel stool. Surgical clips in the included right abdomen likely reflect cholecystectomy. Surgical clips and staples in the LEFT upper quadrant.  IMPRESSION: No acute cardiopulmonary process.  Multiple loops of gas distended bowel, could reflect ileus or possible distal large bowel obstruction. Recommend followup.   Electronically Signed   By: Elon Alas   On: 02/08/2014 04:40    Anti-infectives: Anti-infectives    None      Assessment/Plan: Impression: Small bowel obstruction with constipation secondary to adhesive disease, intestinal atony. Plan: Continue NG tube decompression. Will repeat enema today.  LOS: 1 day    Jadia Capers A 02/09/2014

## 2014-02-10 LAB — BASIC METABOLIC PANEL
ANION GAP: 10 (ref 5–15)
BUN: 3 mg/dL — AB (ref 6–23)
CO2: 30 mEq/L (ref 19–32)
Calcium: 8.3 mg/dL — ABNORMAL LOW (ref 8.4–10.5)
Chloride: 102 mEq/L (ref 96–112)
Creatinine, Ser: 1.03 mg/dL (ref 0.50–1.35)
GFR, EST AFRICAN AMERICAN: 87 mL/min — AB (ref >90)
GFR, EST NON AFRICAN AMERICAN: 75 mL/min — AB (ref >90)
Glucose, Bld: 109 mg/dL — ABNORMAL HIGH (ref 70–99)
Potassium: 4 mEq/L (ref 3.7–5.3)
Sodium: 142 mEq/L (ref 137–147)

## 2014-02-10 LAB — CBC
HCT: 37.6 % — ABNORMAL LOW (ref 39.0–52.0)
Hemoglobin: 12.7 g/dL — ABNORMAL LOW (ref 13.0–17.0)
MCH: 31.3 pg (ref 26.0–34.0)
MCHC: 33.8 g/dL (ref 30.0–36.0)
MCV: 92.6 fL (ref 78.0–100.0)
PLATELETS: 170 10*3/uL (ref 150–400)
RBC: 4.06 MIL/uL — AB (ref 4.22–5.81)
RDW: 14.1 % (ref 11.5–15.5)
WBC: 7.8 10*3/uL (ref 4.0–10.5)

## 2014-02-10 NOTE — Progress Notes (Signed)
  Subjective: Has had some bowel movements and flatus. Still feels a little nauseated.  Objective: Vital signs in last 24 hours: Temp:  [98.6 F (37 C)-98.7 F (37.1 C)] 98.7 F (37.1 C) (11/22 0624) Pulse Rate:  [83-101] 83 (11/22 0624) Resp:  [18-20] 20 (11/22 0624) BP: (135-160)/(79-97) 140/80 mmHg (11/22 0624) SpO2:  [96 %-100 %] 96 % (11/22 0624) Last BM Date: 02/09/14  Intake/Output from previous day: 11/21 0701 - 11/22 0700 In: 1800 [I.V.:1200; NG/GT:600] Out: 501 [Emesis/NG output:500; Stool:1] Intake/Output this shift:    General appearance: alert, cooperative and no distress Resp: clear to auscultation bilaterally Cardio: regular rate and rhythm, S1, S2 normal, no murmur, click, rub or gallop GI: soft, non-tender; bowel sounds normal; no masses,  no organomegaly  Lab Results:   Recent Labs  02/08/14 0332 02/10/14 0631  WBC 9.7 7.8  HGB 12.7* 12.7*  HCT 36.4* 37.6*  PLT 211 170   BMET  Recent Labs  02/08/14 0332 02/10/14 0631  NA 138 142  K 4.3 4.0  CL 100 102  CO2 25 30  GLUCOSE 86 109*  BUN 11 3*  CREATININE 1.12 1.03  CALCIUM 9.2 8.3*   PT/INR No results for input(s): LABPROT, INR in the last 72 hours.  Studies/Results: No results found.  Anti-infectives: Anti-infectives    None      Assessment/Plan: Impression: Small bowel obstruction secondary to adhesive disease, constipation, bowel dysfunction. Slightly improved. Plan: Continue NG tube decompression. Hopefully can advance diet in the next 24-48 hrs.  LOS: 2 days    Kano Heckmann A 02/10/2014

## 2014-02-10 NOTE — Plan of Care (Signed)
Problem: Consults Goal: General Medical Patient Education See Patient Education Module for specific education.  Outcome: Completed/Met Date Met:  02/10/14

## 2014-02-11 LAB — CBC
HCT: 36.7 % — ABNORMAL LOW (ref 39.0–52.0)
Hemoglobin: 12.6 g/dL — ABNORMAL LOW (ref 13.0–17.0)
MCH: 31.9 pg (ref 26.0–34.0)
MCHC: 34.3 g/dL (ref 30.0–36.0)
MCV: 92.9 fL (ref 78.0–100.0)
PLATELETS: 160 10*3/uL (ref 150–400)
RBC: 3.95 MIL/uL — AB (ref 4.22–5.81)
RDW: 13.8 % (ref 11.5–15.5)
WBC: 8.5 10*3/uL (ref 4.0–10.5)

## 2014-02-11 MED ORDER — BISACODYL 10 MG RE SUPP
10.0000 mg | Freq: Two times a day (BID) | RECTAL | Status: DC
  Administered 2014-02-11 – 2014-02-17 (×12): 10 mg via RECTAL
  Filled 2014-02-11 (×13): qty 1

## 2014-02-11 NOTE — Care Management Utilization Note (Signed)
UR review complete.  

## 2014-02-11 NOTE — Plan of Care (Signed)
Problem: Consults Goal: Skin Care Protocol Initiated - if Braden Score 18 or less If consults are not indicated, leave blank or document N/A  Outcome: Not Applicable Date Met:  46/00/29 Goal: Diabetes Guidelines if Diabetic/Glucose > 140 If diabetic or lab glucose is > 140 mg/dl - Initiate Diabetes/Hyperglycemia Guidelines & Document Interventions  Outcome: Not Applicable Date Met:  84/73/08  Problem: Phase I Progression Outcomes Goal: Pain controlled with appropriate interventions Outcome: Completed/Met Date Met:  02/11/14 Goal: OOB as tolerated unless otherwise ordered Outcome: Completed/Met Date Met:  02/11/14 Goal: Hemodynamically stable Outcome: Completed/Met Date Met:  02/11/14

## 2014-02-11 NOTE — Progress Notes (Signed)
  Subjective: No acute change. States he had disconnected his NG tube and gout a little nauseated. Is passing flatus but no significant bowel movement suggest a day.  Objective: Vital signs in last 24 hours: Temp:  [98.2 F (36.8 C)-98.5 F (36.9 C)] 98.2 F (36.8 C) (11/23 0607) Pulse Rate:  [91-97] 91 (11/22 2300) Resp:  [20] 20 (11/23 0607) BP: (129-165)/(77-86) 165/82 mmHg (11/23 0607) SpO2:  [96 %-97 %] 96 % (11/22 2300) Last BM Date: 02/09/14  Intake/Output from previous day: 11/22 0701 - 11/23 0700 In: 3220 [I.V.:2420; NG/GT:800] Out: 2002 [Urine:1302; Emesis/NG output:700] Intake/Output this shift:    General appearance: alert, cooperative and no distress GI: Soft, no tenderness. No hernias. Minimal bowel sounds appreciated.  Lab Results:   Recent Labs  02/10/14 0631 02/11/14 0605  WBC 7.8 8.5  HGB 12.7* 12.6*  HCT 37.6* 36.7*  PLT 170 160   BMET  Recent Labs  02/10/14 0631  NA 142  K 4.0  CL 102  CO2 30  GLUCOSE 109*  BUN 3*  CREATININE 1.03  CALCIUM 8.3*   PT/INR No results for input(s): LABPROT, INR in the last 72 hours.  Studies/Results: No results found.  Anti-infectives: Anti-infectives    None      Assessment/Plan: Impression: Dysfunctional bowel, adhesive disease, intestinal atony Plan: Continue current management. Will start Dulcolax suppositories.  LOS: 3 days    Mylisa Brunson A 02/11/2014

## 2014-02-12 NOTE — Plan of Care (Signed)
Problem: Consults Goal: Nutrition Consult-if indicated Outcome: Completed/Met Date Met:  02/12/14  Problem: Phase I Progression Outcomes Goal: Initial discharge plan identified Outcome: Completed/Met Date Met:  02/12/14 Goal: Other Phase I Outcomes/Goals Outcome: Completed/Met Date Met:  02/12/14  Problem: Phase II Progression Outcomes Goal: Progress activity as tolerated unless otherwise ordered Outcome: Completed/Met Date Met:  02/12/14 Goal: Discharge plan established Outcome: Completed/Met Date Met:  02/12/14 Goal: Vital signs remain stable Outcome: Completed/Met Date Met:  02/12/14 Goal: Obtain order to discontinue catheter if appropriate Outcome: Not Applicable Date Met:  24/81/85 Goal: Other Phase II Outcomes/Goals Outcome: Completed/Met Date Met:  02/12/14

## 2014-02-12 NOTE — Progress Notes (Signed)
NUTRITION FOLLOW UP  Intervention:   -RD to follow for diet advancement -Supplement diet as appropriate   Nutrition Dx:   Inadequate oral intake related to partial small bowel obstruction as evidenced by NPO status; ongoing  Goal:   Pt to meet >/= 90% of their estimated nutrition needs; goal not met  Monitor:   Meal and supplement percent intake, labs and wt trends   Assessment:   Pt presents with nausea and constipation. CT indicates partial small bowel obstruction therefore his is currently NPO. His weight hx is stable. No physical signs of malnutrition observed at this time.  Pt remains with NGT for decompression. Per surgeon notes, SBO improving slowly. Noted 1200 ml output yesterday. Pt is receiving enemas due to constipation.  Per MD, plan to advance diet in the next 24-48 hours.  Labs reviewed. BUN: 3, Calcium: 8.3, Glucose: 109.   Height: Ht Readings from Last 1 Encounters:  02/08/14 _0  (1.753 m)    Weight Status:   Wt Readings from Last 1 Encounters:  02/08/14 138 lb (62.596 kg)   02/08/14 140 lb (63.504 kg)    Re-estimated needs:  Kcal: 1800-2000 Protein: 75-85 grams Fluid: 1.8-2.0 L  Skin: WDL  Diet Order: Diet NPO time specified Except for: Ice Chips   Intake/Output Summary (Last 24 hours) at 02/12/14 1129 Last data filed at 02/12/14 0700  Gross per 24 hour  Intake   4200 ml  Output   1450 ml  Net   2750 ml    Last BM: 02/09/14   Labs:   Recent Labs Lab 02/08/14 0332 02/10/14 0631  NA 138 142  K 4.3 4.0  CL 100 102  CO2 25 30  BUN 11 3*  CREATININE 1.12 1.03  CALCIUM 9.2 8.3*  GLUCOSE 86 109*    CBG (last 3)  No results for input(s): GLUCAP in the last 72 hours.  Scheduled Meds: . bisacodyl  10 mg Rectal BID AC  . enoxaparin  60 mg Subcutaneous QPM  . irbesartan  75 mg Oral Daily   And  . hydrochlorothiazide  12.5 mg Oral Daily  . pantoprazole (PROTONIX) IV  40 mg Intravenous QHS    Continuous Infusions: . dextrose  5% lactated ringers 100 mL/hr at 02/12/14 0510    Dakotah Orrego A. Jimmye Norman, RD, LDN Pager: [phone]

## 2014-02-12 NOTE — Progress Notes (Deleted)
Patient's ordered VTE is heparin. He refused and was educated. George Hugh D, RN

## 2014-02-12 NOTE — Progress Notes (Signed)
  Subjective: No significant change. No abdominal pain.  Objective: Vital signs in last 24 hours: Temp:  [98.3 F (36.8 C)-98.8 F (37.1 C)] 98.8 F (37.1 C) (11/24 0626) Pulse Rate:  [79-90] 79 (11/24 0626) Resp:  [18-20] 18 (11/24 0626) BP: (138-160)/(70-83) 151/79 mmHg (11/24 0626) SpO2:  [90 %-98 %] 96 % (11/24 0626) Last BM Date: 01/09/14  Intake/Output from previous day: 11/23 0701 - 11/24 0700 In: 4200 [I.V.:2300; NG/GT:1900] Out: 1450 [Urine:1450] Intake/Output this shift:    General appearance: alert, cooperative and no distress Resp: clear to auscultation bilaterally Cardio: regular rate and rhythm, S1, S2 normal, no murmur, click, rub or gallop GI: Soft, flat. Minimal bowel sounds. No distention noted.  Lab Results:   Recent Labs  02/10/14 0631 02/11/14 0605  WBC 7.8 8.5  HGB 12.7* 12.6*  HCT 37.6* 36.7*  PLT 170 160   BMET  Recent Labs  02/10/14 0631  NA 142  K 4.0  CL 102  CO2 30  GLUCOSE 109*  BUN 3*  CREATININE 1.03  CALCIUM 8.3*   PT/INR No results for input(s): LABPROT, INR in the last 72 hours.  Studies/Results: No results found.  Anti-infectives: Anti-infectives    None      Assessment/Plan: Impression: Small bowel obstruction, slowly resolving, intestinal motility dysfunction Plan: Continue NG tube decompression. Will give soapsuds enema today.  LOS: 4 days    Eretria Manternach A 02/12/2014

## 2014-02-13 ENCOUNTER — Ambulatory Visit (HOSPITAL_COMMUNITY): Payer: Federal, State, Local not specified - PPO

## 2014-02-13 ENCOUNTER — Encounter (HOSPITAL_COMMUNITY): Payer: Federal, State, Local not specified - PPO

## 2014-02-13 LAB — CBC
HEMATOCRIT: 35.7 % — AB (ref 39.0–52.0)
HEMOGLOBIN: 12 g/dL — AB (ref 13.0–17.0)
MCH: 30.9 pg (ref 26.0–34.0)
MCHC: 33.6 g/dL (ref 30.0–36.0)
MCV: 92 fL (ref 78.0–100.0)
Platelets: 144 10*3/uL — ABNORMAL LOW (ref 150–400)
RBC: 3.88 MIL/uL — AB (ref 4.22–5.81)
RDW: 13.5 % (ref 11.5–15.5)
WBC: 7.2 10*3/uL (ref 4.0–10.5)

## 2014-02-13 MED ORDER — METOCLOPRAMIDE HCL 5 MG/ML IJ SOLN
10.0000 mg | Freq: Four times a day (QID) | INTRAMUSCULAR | Status: DC
  Administered 2014-02-13 – 2014-02-17 (×17): 10 mg via INTRAVENOUS
  Filled 2014-02-13 (×17): qty 2

## 2014-02-13 NOTE — Plan of Care (Signed)
Problem: Phase III Progression Outcomes Goal: Foley discontinued Outcome: Not Applicable Date Met:  12/19/55

## 2014-02-13 NOTE — Plan of Care (Signed)
Problem: Phase III Progression Outcomes Goal: Voiding independently Outcome: Completed/Met Date Met:  02/13/14

## 2014-02-13 NOTE — Progress Notes (Signed)
  Subjective: No change from yesterday. Did not have any results with soapsuds enema. Still feels nauseated.  Objective: Vital signs in last 24 hours: Temp:  [98.8 F (37.1 C)-98.9 F (37.2 C)] 98.8 F (37.1 C) (11/25 0610) Pulse Rate:  [76-80] 80 (11/25 0610) Resp:  [18-20] 19 (11/25 0610) BP: (112-153)/(73-87) 112/73 mmHg (11/25 0610) SpO2:  [97 %] 97 % (11/25 0610) Last BM Date: 02/09/14  Intake/Output from previous day: 11/24 0701 - 11/25 0700 In: 2300 [I.V.:2300] Out: 2200 [Urine:400; Emesis/NG output:1800] Intake/Output this shift: Total I/O In: 216.7 [I.V.:216.7] Out: -   General appearance: alert, cooperative and no distress Resp: clear to auscultation bilaterally Cardio: regular rate and rhythm, S1, S2 normal, no murmur, click, rub or gallop GI: Soft, flat. Minimal bowel sounds appreciated. No tenderness noted.  Lab Results:   Recent Labs  02/11/14 0605 02/13/14 0500  WBC 8.5 7.2  HGB 12.6* 12.0*  HCT 36.7* 35.7*  PLT 160 144*   BMET No results for input(s): NA, K, CL, CO2, GLUCOSE, BUN, CREATININE, CALCIUM in the last 72 hours. PT/INR No results for input(s): LABPROT, INR in the last 72 hours.  Studies/Results: No results found.  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 Plan: We'll try Reglan as a short course to see if this will improve bowel function in order to remove the NG tube.  LOS: 5 days    Arma Reining A 02/13/2014

## 2014-02-13 NOTE — Plan of Care (Signed)
Problem: Phase III Progression Outcomes Goal: Activity at appropriate level-compared to baseline (UP IN CHAIR FOR HEMODIALYSIS)  Outcome: Completed/Met Date Met:  02/13/14

## 2014-02-14 NOTE — Progress Notes (Signed)
  Subjective: No bowel movement or flatus yet.  Objective: Vital signs in last 24 hours: Temp:  [97.7 F (36.5 C)-98.7 F (37.1 C)] 97.7 F (36.5 C) (11/26 0709) Pulse Rate:  [62-68] 65 (11/26 0709) Resp:  [20] 20 (11/26 0709) BP: (133-148)/(48-73) 138/73 mmHg (11/26 0709) SpO2:  [95 %-97 %] 95 % (11/26 0709) Last BM Date: 02/09/14  Intake/Output from previous day: 11/25 0701 - 11/26 0700 In: 2210 [I.V.:2210] Out: 600 [Urine:600] Intake/Output this shift:    General appearance: alert, cooperative and no distress Resp: clear to auscultation bilaterally Cardio: regular rate and rhythm, S1, S2 normal, no murmur, click, rub or gallop GI: Soft, flat. No significant bowel sounds appreciated.  Lab Results:   Recent Labs  02/13/14 0500  WBC 7.2  HGB 12.0*  HCT 35.7*  PLT 144*   BMET No results for input(s): NA, K, CL, CO2, GLUCOSE, BUN, CREATININE, CALCIUM in the last 72 hours. PT/INR No results for input(s): LABPROT, INR in the last 72 hours.  Studies/Results: No results found.  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 Plan: We'll try to clamp the NG tube.  LOS: 6 days    Ramisa Duman A 02/14/2014

## 2014-02-14 NOTE — Progress Notes (Signed)
This encounter was created in error - please disregard.

## 2014-02-14 NOTE — Progress Notes (Signed)
Pt has ambulated on several occasions during day shift. Tolerated well. No complaints at this time.

## 2014-02-15 LAB — CBC
HEMATOCRIT: 34.4 % — AB (ref 39.0–52.0)
HEMOGLOBIN: 11.8 g/dL — AB (ref 13.0–17.0)
MCH: 31.3 pg (ref 26.0–34.0)
MCHC: 34.3 g/dL (ref 30.0–36.0)
MCV: 91.2 fL (ref 78.0–100.0)
Platelets: 150 10*3/uL (ref 150–400)
RBC: 3.77 MIL/uL — AB (ref 4.22–5.81)
RDW: 13.2 % (ref 11.5–15.5)
WBC: 8.1 10*3/uL (ref 4.0–10.5)

## 2014-02-15 LAB — BASIC METABOLIC PANEL
ANION GAP: 9 (ref 5–15)
BUN: 3 mg/dL — AB (ref 6–23)
CHLORIDE: 101 meq/L (ref 96–112)
CO2: 31 mEq/L (ref 19–32)
CREATININE: 0.97 mg/dL (ref 0.50–1.35)
Calcium: 8 mg/dL — ABNORMAL LOW (ref 8.4–10.5)
GFR calc Af Amer: >90 mL/min (ref >90)
GFR calc non Af Amer: 86 mL/min — ABNORMAL LOW (ref >90)
GLUCOSE: 101 mg/dL — AB (ref 70–99)
Potassium: 3.4 mEq/L — ABNORMAL LOW (ref 3.7–5.3)
Sodium: 141 mEq/L (ref 137–147)

## 2014-02-15 NOTE — Care Management Utilization Note (Signed)
UR review complete.  

## 2014-02-15 NOTE — Plan of Care (Signed)
Problem: Discharge Progression Outcomes Goal: Pain controlled with appropriate interventions Outcome: Completed/Met Date Met:  02/15/14     

## 2014-02-15 NOTE — Progress Notes (Signed)
  Subjective: Less nausea noted. NG tube aspirate much clearer today.  Objective: Vital signs in last 24 hours: Temp:  [98 F (36.7 C)-98.2 F (36.8 C)] 98 F (36.7 C) (11/27 0631) Pulse Rate:  [64-69] 64 (11/27 0631) Resp:  [20] 20 (11/27 0631) BP: (139-155)/(76-89) 139/76 mmHg (11/27 0631) SpO2:  [98 %] 98 % (11/27 0631) Last BM Date: 02/09/14  Intake/Output from previous day: 11/26 0701 - 11/27 0700 In: 2470 [I.V.:2470] Out: 1200 [Urine:1200] Intake/Output this shift:    General appearance: alert, cooperative and no distress Resp: clear to auscultation bilaterally Cardio: regular rate and rhythm, S1, S2 normal, no murmur, click, rub or gallop GI: soft, non-tender; bowel sounds normal; no masses,  no organomegaly  Lab Results:   Recent Labs  02/13/14 0500 02/15/14 0635  WBC 7.2 8.1  HGB 12.0* 11.8*  HCT 35.7* 34.4*  PLT 144* 150   BMET No results for input(s): NA, K, CL, CO2, GLUCOSE, BUN, CREATININE, CALCIUM in the last 72 hours. PT/INR No results for input(s): LABPROT, INR in the last 72 hours.  Studies/Results: No results found.  Anti-infectives: Anti-infectives    None      Assessment/Plan: Impression: Small bowel dysfunction, slowly resolving Plan: We will remove gastric tube and advance diet as tolerated.  LOS: 7 days    Kumiko Fishman A 02/15/2014

## 2014-02-16 NOTE — Progress Notes (Signed)
  Subjective: Has had small bowel movements. No emesis noted.  Objective: Vital signs in last 24 hours: Temp:  [97.9 F (36.6 C)-98.6 F (37 C)] 98.6 F (37 C) (11/28 0956) Pulse Rate:  [61-90] 90 (11/28 0956) Resp:  [20] 20 (11/28 0515) BP: (138-192)/(73-91) 153/76 mmHg (11/28 0956) SpO2:  [96 %-98 %] 97 % (11/28 0956) Last BM Date: 02/15/14  Intake/Output from previous day: 11/27 0701 - 11/28 0700 In: 3008.3 [P.O.:600; I.V.:2408.3] Out: 1000 [Urine:1000] Intake/Output this shift:    General appearance: alert, cooperative and no distress Resp: clear to auscultation bilaterally Cardio: regular rate and rhythm, S1, S2 normal, no murmur, click, rub or gallop GI: soft, non-tender; bowel sounds normal; no masses,  no organomegaly  Lab Results:   Recent Labs  02/15/14 0635  WBC 8.1  HGB 11.8*  HCT 34.4*  PLT 150   BMET  Recent Labs  02/15/14 0942  NA 141  K 3.4*  CL 101  CO2 31  GLUCOSE 101*  BUN 3*  CREATININE 0.97  CALCIUM 8.0*   PT/INR No results for input(s): LABPROT, INR in the last 72 hours.  Studies/Results: No results found.  Anti-infectives: Anti-infectives    None      Assessment/Plan: Impression: Small bowel dysfunction, resolving Plan: Advance to soft diet. Anticipate discharge in next 24-48 hours.  LOS: 8 days    Emmert Roethler A 02/16/2014

## 2014-02-17 MED ORDER — HEPARIN SOD (PORK) LOCK FLUSH 100 UNIT/ML IV SOLN
500.0000 [IU] | INTRAVENOUS | Status: AC | PRN
  Administered 2014-02-17: 500 [IU]
  Filled 2014-02-17: qty 5

## 2014-02-17 NOTE — Discharge Summary (Signed)
Physician Discharge Summary  [name] MRN: [id number] DOB/AGE: Jun 14, 1950 63 y.o.  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 He felt was similar to his previous bowel obstruction episode. He placed his own NG tube and was admitted to hospital for hydration and bowel rest. Once his bowel function returned, the NG tube was removed as diet was advanced at difficulty. He is being discharged home on 02/17/2014 in good improving condition.  Discharge Exam: Blood pressure 129/87, pulse 58, temperature 98.5 F (36.9 C), temperature source Oral, resp. rate 20, height 5\' 9"  (1.753 m), weight 62.596 kg (138 lb), SpO2 92 %. General appearance: alert, cooperative and no distress Resp: clear to auscultation bilaterally Cardio: regular rate and rhythm, S1, S2 normal, no murmur, click, rub or gallop GI: soft, non-tender; bowel sounds normal; no masses,  no organomegaly  Disposition: 01-Home or Self Care     Medication List    TAKE these medications        alendronate 70 MG tablet  Commonly known as:  FOSAMAX  Take 70 mg by mouth every 7 (seven) days. On sundays     BENEFIBER PO  Take 1 each by mouth daily as needed (to regulate bowel movements/for supplement).     butalbital-acetaminophen-caffeine 50-325-40 MG per tablet  Commonly known as:  FIORICET, ESGIC  1 tablet 3 (three) times daily as needed for headache or migraine.     cholecalciferol 1000 UNITS tablet  Commonly known as:  VITAMIN D  Take 1,000 Units by mouth every morning.     cyanocobalamin  1000 MCG/ML injection  Commonly known as:  (VITAMIN B-12)  Inject 1,000 mcg into the muscle every 30 (thirty) days.     cyclobenzaprine 10 MG tablet  Commonly known as:  FLEXERIL  Take 10 mg by mouth daily as needed for muscle spasms.     dexlansoprazole 60 MG capsule  Commonly known as:  DEXILANT  Take 60 mg by mouth every morning.     dicyclomine 20 MG tablet  Commonly known as:  BENTYL  Take 1 tablet by mouth daily as needed for spasms.     diphenoxylate-atropine 2.5-0.025 MG per tablet  Commonly known as:  LOMOTIL  Take 1 tablet by mouth daily as needed for diarrhea or loose stools.     docusate sodium 100 MG capsule  Commonly known as:  COLACE  Take 100 mg by mouth 2 (two) times daily.     enoxaparin 60 MG/0.6ML injection  Commonly known as:  LOVENOX  Inject 60 mg into the skin every evening.     ENSURE PLUS Liqd  Take 237 mLs by mouth daily. Every other day     FEROCON capsule  Generic drug:  ferrous [id number] C-folic acid  Take 1 capsule by mouth daily.     ferrous sulfate 325 (65 FE) MG tablet  Take 325 mg by mouth daily with breakfast.     folic acid 1 MG tablet  Commonly known as:  FOLVITE  Take 1 mg by mouth every morning.     LORazepam 1 MG tablet  Commonly known as:  ATIVAN  Take 1 mg  by mouth every 4 (four) hours as needed. anxiety     Melatonin 10 MG Caps  Take 10 mg by mouth at bedtime.     multivitamins ther. w/minerals Tabs tablet  Take 1 tablet by mouth daily.     ondansetron 4 MG tablet  Commonly known as:  ZOFRAN  Take 4 mg by mouth daily as needed for nausea.     polyethylene glycol powder powder  Commonly known as:  GLYCOLAX/MIRALAX  Take 17 g by mouth as needed. For constipation     PROAIR HFA 108 (90 BASE) MCG/ACT inhaler  Generic drug:  albuterol  Inhale into the lungs every 6 (six) hours as needed for wheezing or shortness of breath.     prochlorperazine 10 MG tablet  Commonly known as:  COMPAZINE  Take 10 mg by  mouth every 6 (six) hours as needed. Nausea and Vomiting     psyllium 0.52 G capsule  Commonly known as:  REGULOID  Take 2.6 g by mouth daily. *5 capsules taken twice daily*     sertraline 100 MG tablet  Commonly known as:  ZOLOFT  Take 150 mg by mouth at bedtime.     simvastatin 10 MG tablet  Commonly known as:  ZOCOR  Take 10 mg by mouth daily.     sucralfate 1 GM/10ML suspension  Commonly known as:  CARAFATE  Take 10 mLs (1 g total) by mouth 4 (four) times daily. As needed     thiamine 100 MG tablet  Take 100 mg by mouth every morning.     traMADol 50 MG tablet  Commonly known as:  ULTRAM  Take 50 mg by mouth every 6 (six) hours as needed for moderate pain or severe pain.     valsartan-hydrochlorothiazide 80-12.5 MG per tablet  Commonly known as:  DIOVAN-HCT  Take 1 tablet by mouth every morning.     Vitamin D (Ergocalciferol) 50000 UNITS Caps capsule  Commonly known as:  DRISDOL  Take 50,000 Units by mouth once a week. Takes on Sunday           Follow-up Information    Follow up with Jamesetta So, MD.   Specialty:  General Surgery   Why:  As needed   Contact information:   [id number] [place] [id number] [phone]       Signed: Aviva Signs A 02/17/2014, 11:35 AM

## 2014-02-17 NOTE — Plan of Care (Signed)
Problem: Phase III Progression Outcomes Goal: Discharge plan remains appropriate-arrangements made Outcome: Completed/Met Date Met:  02/17/14

## 2014-02-17 NOTE — Progress Notes (Signed)
[name] discharged home per MD order.  Discharge instructions reviewed and discussed with the patient, all questions and concerns answered. Copy of instructions and scripts given to patient.    Medication List    TAKE these medications        alendronate 70 MG tablet  Commonly known as:  FOSAMAX  Take 70 mg by mouth every 7 (seven) days. On sundays     BENEFIBER PO  Take 1 each by mouth daily as needed (to regulate bowel movements/for supplement).     butalbital-acetaminophen-caffeine 50-325-40 MG per tablet  Commonly known as:  FIORICET, ESGIC  1 tablet 3 (three) times daily as needed for headache or migraine.     cholecalciferol 1000 UNITS tablet  Commonly known as:  VITAMIN D  Take 1,000 Units by mouth every morning.     cyanocobalamin 1000 MCG/ML injection  Commonly known as:  (VITAMIN B-12)  Inject 1,000 mcg into the muscle every 30 (thirty) days.     cyclobenzaprine 10 MG tablet  Commonly known as:  FLEXERIL  Take 10 mg by mouth daily as needed for muscle spasms.     dexlansoprazole 60 MG capsule  Commonly known as:  DEXILANT  Take 60 mg by mouth every morning.     dicyclomine 20 MG tablet  Commonly known as:  BENTYL  Take 1 tablet by mouth daily as needed for spasms.     diphenoxylate-atropine 2.5-0.025 MG per tablet  Commonly known as:  LOMOTIL  Take 1 tablet by mouth daily as needed for diarrhea or loose stools.     docusate sodium 100 MG capsule  Commonly known as:  COLACE  Take 100 mg by mouth 2 (two) times daily.     enoxaparin 60 MG/0.6ML injection  Commonly known as:  LOVENOX  Inject 60 mg into the skin every evening.     ENSURE PLUS Liqd  Take 237 mLs by mouth daily. Every other day     FEROCON capsule  Generic drug:  ferrous [id number] C-folic acid  Take 1 capsule by mouth daily.     ferrous sulfate 325 (65 FE) MG tablet  Take 325 mg by mouth daily with breakfast.     folic acid 1 MG tablet  Commonly known as:  FOLVITE   Take 1 mg by mouth every morning.     LORazepam 1 MG tablet  Commonly known as:  ATIVAN  Take 1 mg by mouth every 4 (four) hours as needed. anxiety     Melatonin 10 MG Caps  Take 10 mg by mouth at bedtime.     multivitamins ther. w/minerals Tabs tablet  Take 1 tablet by mouth daily.     ondansetron 4 MG tablet  Commonly known as:  ZOFRAN  Take 4 mg by mouth daily as needed for nausea.     polyethylene glycol powder powder  Commonly known as:  GLYCOLAX/MIRALAX  Take 17 g by mouth as needed. For constipation     PROAIR HFA 108 (90 BASE) MCG/ACT inhaler  Generic drug:  albuterol  Inhale into the lungs every 6 (six) hours as needed for wheezing or shortness of breath.     prochlorperazine 10 MG tablet  Commonly known as:  COMPAZINE  Take 10 mg by mouth every 6 (six) hours as needed. Nausea and Vomiting     psyllium 0.52 G capsule  Commonly known as:  REGULOID  Take 2.6 g by mouth daily. *5 capsules taken twice daily*  sertraline 100 MG tablet  Commonly known as:  ZOLOFT  Take 150 mg by mouth at bedtime.     simvastatin 10 MG tablet  Commonly known as:  ZOCOR  Take 10 mg by mouth daily.     sucralfate 1 GM/10ML suspension  Commonly known as:  CARAFATE  Take 10 mLs (1 g total) by mouth 4 (four) times daily. As needed     thiamine 100 MG tablet  Take 100 mg by mouth every morning.     traMADol 50 MG tablet  Commonly known as:  ULTRAM  Take 50 mg by mouth every 6 (six) hours as needed for moderate pain or severe pain.     valsartan-hydrochlorothiazide 80-12.5 MG per tablet  Commonly known as:  DIOVAN-HCT  Take 1 tablet by mouth every morning.     Vitamin D (Ergocalciferol) 50000 UNITS Caps capsule  Commonly known as:  DRISDOL  Take 50,000 Units by mouth once a week. Takes on Sunday        Patients skin is clean, dry and intact, no evidence of skin break down. Porta cath [employer] . Site without signs and symptoms of complications. Dressing and pressure  applied.  Patient escorted to car by [name] Le, RN in a wheelchair,  no distress noted upon discharge.  [name] 02/17/2014 12:39 PM

## 2014-02-17 NOTE — Plan of Care (Signed)
Problem: Discharge Progression Outcomes Goal: Activity appropriate for discharge plan Outcome: Progressing

## 2014-02-17 NOTE — Discharge Instructions (Signed)
Small Bowel Obstruction A small bowel obstruction means something is blocking the small intestine. The small intestine is the long tube that connects the stomach to the colon. Treatment depends on what is causing the problem. Treatment also depends on how bad the problem is. HOME CARE Your doctor may let you go home if your small bowel is not completely blocked.  Rest.  Follow your diet as told by your doctor.  Only drink clear liquids until you start to get better.  Avoid solid foods as told by your doctor.  Only take medicine as told by your doctor. GET HELP RIGHT AWAY IF:  You have pain or cramps that get worse.  You throw up (vomit) blood.  You feel sick to your stomach (nauseous), or you cannot stop throwing up.  You cannot drink fluids.  You feel confused.  You feel dry or thirsty (dehydrated).  Your belly gets more puffy (bloated).  You have chills.  You have a fever.  You feel weak, or you pass out (faint). MAKE SURE YOU:  Understand these instructions.  Will watch your condition.  Will get help right away if you are not doing well or get worse. Document Released: 04/15/2004 Document Revised: 05/31/2011 Document Reviewed: 06/16/2010 [facility] Patient Information 2015 [place], [place]. This information is not intended to replace advice given to you by your health care provider. Make sure you discuss any questions you have with your health care provider.

## 2014-02-17 NOTE — Plan of Care (Signed)
Problem: Discharge Progression Outcomes Goal: Tolerating diet Outcome: Progressing

## 2014-02-18 ENCOUNTER — Inpatient Hospital Stay (HOSPITAL_COMMUNITY)
Admission: EM | Admit: 2014-02-18 | Discharge: 2014-02-22 | DRG: 390 | Disposition: A | Discharge status: Home / Self Care | Payer: Federal, State, Local not specified - PPO | Attending: General Surgery | Admitting: General Surgery

## 2014-02-18 ENCOUNTER — Encounter (HOSPITAL_COMMUNITY): Payer: Self-pay | Admitting: *Deleted

## 2014-02-18 ENCOUNTER — Emergency Department (HOSPITAL_COMMUNITY): Payer: Federal, State, Local not specified - PPO

## 2014-02-18 DIAGNOSIS — Z87891 Personal history of nicotine dependence: Secondary | ICD-10-CM | POA: Diagnosis not present

## 2014-02-18 DIAGNOSIS — Z903 Acquired absence of stomach [part of]: Secondary | ICD-10-CM | POA: Diagnosis present

## 2014-02-18 DIAGNOSIS — K565 Intestinal adhesions [bands] with obstruction (postprocedural) (postinfection): Secondary | ICD-10-CM | POA: Diagnosis present

## 2014-02-18 DIAGNOSIS — K219 Gastro-esophageal reflux disease without esophagitis: Secondary | ICD-10-CM | POA: Diagnosis present

## 2014-02-18 DIAGNOSIS — Z79899 Other long term (current) drug therapy: Secondary | ICD-10-CM

## 2014-02-18 DIAGNOSIS — Z85038 Personal history of other malignant neoplasm of large intestine: Secondary | ICD-10-CM | POA: Diagnosis not present

## 2014-02-18 DIAGNOSIS — Z8673 Personal history of transient ischemic attack (TIA), and cerebral infarction without residual deficits: Secondary | ICD-10-CM

## 2014-02-18 DIAGNOSIS — G4730 Sleep apnea, unspecified: Secondary | ICD-10-CM | POA: Diagnosis present

## 2014-02-18 DIAGNOSIS — M810 Age-related osteoporosis without current pathological fracture: Secondary | ICD-10-CM | POA: Diagnosis present

## 2014-02-18 DIAGNOSIS — I10 Essential (primary) hypertension: Secondary | ICD-10-CM | POA: Diagnosis present

## 2014-02-18 DIAGNOSIS — M1990 Unspecified osteoarthritis, unspecified site: Secondary | ICD-10-CM | POA: Diagnosis present

## 2014-02-18 DIAGNOSIS — Z8249 Family history of ischemic heart disease and other diseases of the circulatory system: Secondary | ICD-10-CM | POA: Diagnosis not present

## 2014-02-18 DIAGNOSIS — R112 Nausea with vomiting, unspecified: Secondary | ICD-10-CM | POA: Diagnosis present

## 2014-02-18 DIAGNOSIS — Z86711 Personal history of pulmonary embolism: Secondary | ICD-10-CM | POA: Diagnosis not present

## 2014-02-18 DIAGNOSIS — K56609 Unspecified intestinal obstruction, unspecified as to partial versus complete obstruction: Secondary | ICD-10-CM

## 2014-02-18 LAB — CBC WITH DIFFERENTIAL/PLATELET
Basophils Absolute: 0 10*3/uL (ref 0.0–0.1)
Basophils Relative: 0 % (ref 0–1)
EOS ABS: 0 10*3/uL (ref 0.0–0.7)
Eosinophils Relative: 0 % (ref 0–5)
HCT: 38.3 % — ABNORMAL LOW (ref 39.0–52.0)
Hemoglobin: 13.5 g/dL (ref 13.0–17.0)
LYMPHS ABS: 2.2 10*3/uL (ref 0.7–4.0)
Lymphocytes Relative: 22 % (ref 12–46)
MCH: 31.7 pg (ref 26.0–34.0)
MCHC: 35.2 g/dL (ref 30.0–36.0)
MCV: 89.9 fL (ref 78.0–100.0)
Monocytes Absolute: 0.9 10*3/uL (ref 0.1–1.0)
Monocytes Relative: 9 % (ref 3–12)
NEUTROS PCT: 69 % (ref 43–77)
Neutro Abs: 6.9 10*3/uL (ref 1.7–7.7)
PLATELETS: 236 10*3/uL (ref 150–400)
RBC: 4.26 MIL/uL (ref 4.22–5.81)
RDW: 13.5 % (ref 11.5–15.5)
WBC: 10 10*3/uL (ref 4.0–10.5)

## 2014-02-18 LAB — URINALYSIS, ROUTINE W REFLEX MICROSCOPIC
Bilirubin Urine: NEGATIVE
GLUCOSE, UA: NEGATIVE mg/dL
HGB URINE DIPSTICK: NEGATIVE
Ketones, ur: NEGATIVE mg/dL
Leukocytes, UA: NEGATIVE
Nitrite: NEGATIVE
PH: 5.5 (ref 5.0–8.0)
Protein, ur: NEGATIVE mg/dL
Urobilinogen, UA: 0.2 mg/dL (ref 0.0–1.0)

## 2014-02-18 LAB — COMPREHENSIVE METABOLIC PANEL
ALBUMIN: 3.4 g/dL — AB (ref 3.5–5.2)
ALK PHOS: 73 U/L (ref 39–117)
ALT: 8 U/L (ref 0–53)
AST: 14 U/L (ref 0–37)
Anion gap: 15 (ref 5–15)
BUN: 7 mg/dL (ref 6–23)
CO2: 27 mEq/L (ref 19–32)
Calcium: 9.1 mg/dL (ref 8.4–10.5)
Chloride: 100 mEq/L (ref 96–112)
Creatinine, Ser: 0.96 mg/dL (ref 0.50–1.35)
GFR calc Af Amer: >90 mL/min (ref >90)
GFR calc non Af Amer: 86 mL/min — ABNORMAL LOW (ref >90)
GLUCOSE: 114 mg/dL — AB (ref 70–99)
POTASSIUM: 3.3 meq/L — AB (ref 3.7–5.3)
Sodium: 142 mEq/L (ref 137–147)
TOTAL PROTEIN: 6.6 g/dL (ref 6.0–8.3)
Total Bilirubin: 0.3 mg/dL (ref 0.3–1.2)

## 2014-02-18 LAB — LIPASE, BLOOD: Lipase: 53 U/L (ref 11–59)

## 2014-02-18 MED ORDER — SODIUM CHLORIDE 0.9 % IV SOLN
INTRAVENOUS | Status: AC
  Administered 2014-02-19: 01:00:00 via INTRAVENOUS

## 2014-02-18 MED ORDER — ONDANSETRON HCL 4 MG/2ML IJ SOLN
4.0000 mg | Freq: Once | INTRAMUSCULAR | Status: AC
  Administered 2014-02-18: 4 mg via INTRAVENOUS
  Filled 2014-02-18: qty 2

## 2014-02-18 MED ORDER — HYDROMORPHONE HCL 1 MG/ML IJ SOLN: 1.0000 mg | INTRAMUSCULAR | Status: DC | PRN

## 2014-02-18 MED ORDER — ONDANSETRON HCL 4 MG/2ML IJ SOLN
4.0000 mg | Freq: Three times a day (TID) | INTRAMUSCULAR | Status: DC | PRN
  Administered 2014-02-19: 4 mg via INTRAVENOUS
  Filled 2014-02-18: qty 2

## 2014-02-18 MED ORDER — HYDROMORPHONE HCL 1 MG/ML IJ SOLN
1.0000 mg | Freq: Once | INTRAMUSCULAR | Status: AC
  Administered 2014-02-18: 1 mg via INTRAVENOUS
  Filled 2014-02-18: qty 1

## 2014-02-18 MED ORDER — SODIUM CHLORIDE 0.9 % IV BOLUS (SEPSIS)
1000.0000 mL | Freq: Once | INTRAVENOUS | Status: AC
  Administered 2014-02-18: 1000 mL via INTRAVENOUS

## 2014-02-18 MED ORDER — IOHEXOL 300 MG/ML  SOLN
50.0000 mL | Freq: Once | INTRAMUSCULAR | Status: AC | PRN
  Administered 2014-02-18: 50 mL via ORAL

## 2014-02-18 NOTE — Care Management Utilization Note (Signed)
UR review complete.  

## 2014-02-18 NOTE — ED Notes (Signed)
Pt states he was recently d/c for bowel obstruction. Pt states he never had a normal BM while he was here. States he hasn't slept since Sunday morning after being d/c'd. Pt states N/V.

## 2014-02-18 NOTE — ED Provider Notes (Signed)
CSN: [id number]     Arrival date & time 02/18/14  1709 History  This chart was scribe for Jamesetta So, MD by Judithann Sauger, ED Scribe. The patient was seen in room [facility] and the patient's care was started at 6:40 PM.    Chief Complaint  Patient presents with  . Abdominal Pain   The history is provided by the patient. No language interpreter was used.   HPI Comments: [name] is a 63 y.o. male who presents to the Emergency Department complaining of a gradually worsening left upper abdominal pain onset 6 hours ago. He reports that the pain is worse on exertion. He reports associated one episode of vomiting. He reports that he had one BM this morning but has not had gas ever since. He denies fever, CP, and SOB. He denies taking any mdeication for his symptoms. He denies any allergies to medication.   Past Medical History  Diagnosis Date  . Pulmonary embolism 02/2010  . Hypertension   . Osteoporosis   . Arthritis   . TIA (transient ischemic attack) 10/11  . ETOH abuse     quit 03/2010  . 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 Personal history of PE (pulmonary embolism) 10/01/2010  . Blood transfusion   . S/P endoscopy September 28, 2010    erosive reflux esophagitis, Billroth I anatomy  . Shortness of breath   . Sleep apnea   . Recurrent upper respiratory infection (URI)   . Anxiety   . Pneumonia   . Anemia   . Ileus   . Chronic abdominal pain   . GERD (gastroesophageal reflux disease)   . Chest pain at rest   . Erosive esophagitis   . Vitamin B12 deficiency   . Bowel obstruction 05/13/2012    Recurrent  . Hx of Clostridium difficile infection 01/2012  . Bronchitis   . 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 Hernia repair      right inguinal  . Appendectomy  1980s  . Cholecystectomy  1980s  . Portacath placement    . Abdominal sugery      for bowel obstruction x 8, all in 1980s,  except for one in 07/2010  . Esophagogastroduodenoscopy  09/28/2010  . Esophagogastroduodenoscopy  12/01/2010    Cervical web status post dilation, erosive esophagitis, B1 hemigastrectomy, inflamed anastomosis  . Colonoscopy  03/18/2011    anastomosis at 35cm. Several adenomatous polyps removed. Sigmoid diverticulosis. Next TCS 02/2013  . Esophagogastroduodenoscopy  04/16/2011    excoriation at GEJ c/w trauma/M-W tear, friable gastric anastomosis, dilation efferent limb  . Billroth 1 hemigastrectomy  1980s    per patient for benign duodenal tumor  . Esophagogastroduodenoscopy (egd) with esophageal dilation  02/25/2012    OEU:MPNTIRWE esophageal web-s/p dilation anddisruption as described above. Status post prior gastric with Billroth I configuration. Abnormal gastric mucosa at the anastomosis. Gastric biopsy showed mild chronic inflammation but no H. pylori   . Colonoscopy N/A 07/24/2012    RXV:[id number] post segmental resection with normal-appearing colonic anastomosis aside from an adjacent polyp-removed as described above. Rectal polyp-removed as described above. CT findings appear to have been artifactual. tubular adenomas/prolapsed type polyp.  . Colon surgery  May 2012    left hemicolectomy, colon cancer found at time of surgery for bowel obstruction  . Cardiac catheterization  07/17/2012   Family History  Problem Relation Age of Onset  . Hypertension Mother   .  Arthritis Mother   . Pneumonia Mother   . Hypertension Father   . Heart attack Father    History  Substance Use Topics  . Smoking status: Former Smoker -- 0.50 packs/day for 40 years    Types: Cigarettes    Quit date: 12/20/2012  . Smokeless tobacco: Never Used     Comment: Quit x 1 year  . Alcohol Use: No     Comment: last drink was Apr 16 2010    Review of Systems  Constitutional: Negative for fever.       Per HPI, otherwise negative  HENT:       Per HPI, otherwise negative  Respiratory: Negative for shortness of  breath.        Per HPI, otherwise negative  Cardiovascular: Negative for chest pain.       Per HPI, otherwise negative  Gastrointestinal: Positive for nausea, vomiting and abdominal pain. Negative for diarrhea.  Endocrine:       Negative aside from HPI  Genitourinary:       Neg aside from HPI   Musculoskeletal:       Per HPI, otherwise negative  Skin: Negative.   Neurological: Negative for syncope.      Allergies  Review of patient's allergies indicates no known allergies.  Home Medications   Prior to Admission medications   Medication Sig Start Date End Date Taking? Authorizing Provider  albuterol (PROAIR HFA) 108 (90 BASE) MCG/ACT inhaler Inhale into the lungs every 6 (six) hours as needed for wheezing or shortness of breath.   Yes Historical Provider, MD  alendronate (FOSAMAX) 70 MG tablet Take 70 mg by mouth every 7 (seven) days. On sundays 10/14/11  Yes Historical Provider, MD  butalbital-acetaminophen-caffeine (FIORICET, ESGIC) 50-325-40 MG per tablet 1 tablet 3 (three) times daily as needed for headache or migraine.  04/10/13  Yes Historical Provider, MD  cholecalciferol (VITAMIN D) 1000 UNITS tablet Take 1,000 Units by mouth every morning.   Yes Historical Provider, MD  cyclobenzaprine (FLEXERIL) 10 MG tablet Take 10 mg by mouth daily as needed for muscle spasms.  11/18/11  Yes Historical Provider, MD  dexlansoprazole (DEXILANT) 60 MG capsule Take 60 mg by mouth every morning. 03/22/11  Yes Mahala Menghini, PA-C  dicyclomine (BENTYL) 20 MG tablet Take 1 tablet by mouth daily as needed for spasms.  04/24/13  Yes Historical Provider, MD  docusate sodium (COLACE) 100 MG capsule Take 100 mg by mouth 2 (two) times daily.   Yes Historical Provider, MD  enoxaparin (LOVENOX) 60 MG/0.6ML injection Inject 60 mg into the skin every evening.    Yes Historical Provider, MD  ENSURE PLUS (ENSURE PLUS) LIQD Take 237 mLs by mouth daily. Every other day   Yes Historical Provider, MD  ferrous  [id number] C-folic acid (FEROCON) capsule Take 1 capsule by mouth daily.   Yes Historical Provider, MD  ferrous sulfate 325 (65 FE) MG tablet Take 325 mg by mouth daily with breakfast.   Yes Historical Provider, MD  folic acid (FOLVITE) 1 MG tablet Take 1 mg by mouth every morning.    Yes Historical Provider, MD  LORazepam (ATIVAN) 1 MG tablet Take 1 mg by mouth every 4 (four) hours as needed. anxiety   Yes Historical Provider, MD  Melatonin 10 MG CAPS Take 10 mg by mouth at bedtime.   Yes Historical Provider, MD  Multiple Vitamins-Minerals (MULTIVITAMINS THER. W/MINERALS) TABS tablet Take 1 tablet by mouth daily.   Yes Historical Provider, MD  ondansetron (  ZOFRAN) 4 MG tablet Take 4 mg by mouth daily as needed for nausea.   Yes Historical Provider, MD  polyethylene glycol powder (GLYCOLAX/MIRALAX) powder Take 17 g by mouth as needed. For constipation   Yes Historical Provider, MD  Probiotic Product (PROBIOTIC DAILY) CAPS Take 1 capsule by mouth daily.   Yes Historical Provider, MD  prochlorperazine (COMPAZINE) 10 MG tablet Take 10 mg by mouth every 6 (six) hours as needed. Nausea and Vomiting   Yes Historical Provider, MD  sertraline (ZOLOFT) 100 MG tablet Take 150 mg by mouth at bedtime.    Yes Historical Provider, MD  simvastatin (ZOCOR) 10 MG tablet Take 10 mg by mouth at bedtime.    Yes Historical Provider, MD  sucralfate (CARAFATE) 1 G tablet Take 1 g by mouth 4 (four) times daily.   Yes Historical Provider, MD  sucralfate (CARAFATE) 1 GM/10ML suspension Take 10 mLs (1 g total) by mouth 4 (four) times daily. As needed 05/01/13  Yes Mahala Menghini, PA-C  thiamine 100 MG tablet Take 100 mg by mouth every morning.    Yes Historical Provider, MD  traMADol (ULTRAM) 50 MG tablet Take 50 mg by mouth every 6 (six) hours as needed for moderate pain or severe pain.    Yes Historical Provider, MD  valsartan-hydrochlorothiazide (DIOVAN-HCT) 80-12.5 MG per tablet Take 1 tablet by mouth every  morning.    Yes Historical Provider, MD  Vitamin D, Ergocalciferol, (DRISDOL) 50000 UNITS CAPS Take 50,000 Units by mouth once a week. Takes on Sunday 03/13/12  Yes Historical Provider, MD  Wheat Dextrin (BENEFIBER PO) Take 1 each by mouth daily as needed (to regulate bowel movements/for supplement).    Yes Historical Provider, MD  cyanocobalamin (,VITAMIN B-12,) 1000 MCG/ML injection Inject 1,000 mcg into the muscle every 30 (thirty) days.    Historical Provider, MD  psyllium (REGULOID) 0.52 G capsule Take 5 capsules by mouth 2 (two) times daily.     Historical Provider, MD   BP 152/101 mmHg  Pulse 84  Temp(Src) 98.2 F (36.8 C) (Oral)  Resp 20  Ht 5\' 9"  (1.753 m)  Wt 140 lb (63.504 kg)  BMI 20.67 kg/m2  SpO2 98% Physical Exam  Constitutional: He is oriented to person, place, and time. He appears well-developed. No distress.  HENT:  Head: Normocephalic and atraumatic.  Eyes: Conjunctivae and EOM are normal.  Cardiovascular: Normal rate, regular rhythm and normal heart sounds.   Pulmonary/Chest: Effort normal and breath sounds normal. No stridor. No respiratory distress.  Abdominal: He exhibits no distension.  A little guarding with pressure on left side of abdomen.   Musculoskeletal: He exhibits no edema.  Neurological: He is alert and oriented to person, place, and time.  Skin: Skin is warm and dry.  Psychiatric: He has a normal mood and affect.  Nursing note and vitals reviewed.   ED Course  Procedures (including critical care time) DIAGNOSTIC STUDIES: Oxygen Saturation is 100% on RA, normal by my interpretation.    COORDINATION OF CARE: 6:46 PM- Pt advised of plan for treatment and pt agrees.    Labs Review Labs Reviewed  CBC WITH DIFFERENTIAL - Abnormal; Notable for the following:    HCT 38.3 (*)    All other components within normal limits  COMPREHENSIVE METABOLIC PANEL - Abnormal; Notable for the following:    Potassium 3.3 (*)    Glucose, Bld 114 (*)    Albumin  3.4 (*)    GFR calc non Af Amer 86 (*)  All other components within normal limits  URINALYSIS, ROUTINE W REFLEX MICROSCOPIC - Abnormal; Notable for the following:    Specific Gravity, Urine <1.005 (*)    All other components within normal limits  LIPASE, BLOOD    Imaging Review Ct Abdomen Pelvis Wo Contrast  02/18/2014   CLINICAL DATA:  Left upper quadrant abdominal pain, nausea and vomiting. Recent partial small bowel obstruction.  EXAM: CT ABDOMEN AND PELVIS WITHOUT CONTRAST  TECHNIQUE: Multidetector CT imaging of the abdomen and pelvis was performed following the standard protocol without IV contrast.  COMPARISON:  02/08/2014  FINDINGS: There is evidence of increased distention of the stomach, duodenum and proximal small bowel since the prior study with proximal jejunum reaching a maximum caliber of 6 cm. In the anterior mid abdomen, prominent thickened loops of small bowel are identified which has progressed since the prior study. Maximum wall thickness approaches 13 mm. There is no evidence of bowel perforation or focal abscess. Some mild edema is present throughout the mesenteries.  There is evidence of prior surgery at the level of the stomach, proximal small bowel and rectosigmoid colon. The gallbladder has been removed. Unenhanced appearance of the liver, pancreas, spleen and adrenal glands is unremarkable. Small nonobstructing calculi are present in both kidneys. Stable right renal cyst. The bladder is decompressed. No hernias are identified. No enlarged lymph nodes are seen. Stable small hiatal hernia.  IMPRESSION: Increased gastric, duodenal and proximal small bowel dilatation consistent with small bowel obstruction. There is increase thickening of small bowel loops in the anterior mid abdomen. No evidence of bowel perforation or focal abscess.   Electronically Signed   By: Aletta Edouard M.D.   On: 02/18/2014 21:39   10:28 PM Patient has inserted his own and G-tube.  I discussed the  patient's case with our general surgeon. Patient was recently discharged from the surgery service.   MDM   Final diagnoses:  SBO (small bowel obstruction)    Patient with recurrent bowel obstructions now presents with abdominal pain, nausea, vomiting, CT scan demonstrates bowel obstruction.  Patient admitted for further evaluation and management.  I personally performed the services described in this documentation, which was scribed in my presence. The recorded information has been reviewed and is accurate.    Carmin Muskrat, MD 02/18/14 2229

## 2014-02-18 NOTE — ED Notes (Signed)
Patient states he was d/c'd  From [facility] yesterday for treatment of bowel obstruction. Patient states extensive history of bowel complications with multiple surgeries. Patient states emesis X1 today and abdominal pain 8/10.

## 2014-02-19 MED ORDER — HYDROMORPHONE HCL 1 MG/ML IJ SOLN
2.0000 mg | INTRAMUSCULAR | Status: DC | PRN
  Administered 2014-02-19 – 2014-02-22 (×28): 2 mg via INTRAVENOUS
  Filled 2014-02-19 (×28): qty 2

## 2014-02-19 MED ORDER — DIPHENHYDRAMINE HCL 50 MG/ML IJ SOLN: 12.5000 mg | Freq: Four times a day (QID) | INTRAMUSCULAR | Status: DC | PRN

## 2014-02-19 MED ORDER — DIPHENHYDRAMINE HCL 12.5 MG/5ML PO ELIX: 12.5000 mg | ORAL_SOLUTION | Freq: Four times a day (QID) | ORAL | Status: DC | PRN

## 2014-02-19 MED ORDER — KCL IN DEXTROSE-NACL 20-5-0.45 MEQ/L-%-% IV SOLN
INTRAVENOUS | Status: DC
  Administered 2014-02-19 – 2014-02-22 (×4): via INTRAVENOUS

## 2014-02-19 MED ORDER — ENOXAPARIN SODIUM 40 MG/0.4ML ~~LOC~~ SOLN
40.0000 mg | SUBCUTANEOUS | Status: DC
  Administered 2014-02-19 – 2014-02-22 (×4): 40 mg via SUBCUTANEOUS
  Filled 2014-02-19 (×4): qty 0.4

## 2014-02-19 MED ORDER — ALBUTEROL SULFATE HFA 108 (90 BASE) MCG/ACT IN AERS: 1.0000 | INHALATION_SPRAY | Freq: Four times a day (QID) | RESPIRATORY_TRACT | Status: DC | PRN

## 2014-02-19 MED ORDER — HYDROMORPHONE HCL 1 MG/ML IJ SOLN
2.0000 mg | INTRAMUSCULAR | Status: DC | PRN
  Administered 2014-02-19 (×3): 2 mg via INTRAVENOUS
  Filled 2014-02-19 (×3): qty 2

## 2014-02-19 MED ORDER — ACETAMINOPHEN 650 MG RE SUPP: 650.0000 mg | Freq: Four times a day (QID) | RECTAL | Status: DC | PRN

## 2014-02-19 MED ORDER — ONDANSETRON HCL 4 MG/2ML IJ SOLN
4.0000 mg | Freq: Four times a day (QID) | INTRAMUSCULAR | Status: DC | PRN
  Administered 2014-02-19 – 2014-02-21 (×4): 4 mg via INTRAVENOUS
  Filled 2014-02-19 (×4): qty 2

## 2014-02-19 MED ORDER — ALBUTEROL SULFATE (2.5 MG/3ML) 0.083% IN NEBU: 2.5000 mg | INHALATION_SOLUTION | Freq: Four times a day (QID) | RESPIRATORY_TRACT | Status: DC | PRN

## 2014-02-19 MED ORDER — BISACODYL 10 MG RE SUPP
10.0000 mg | Freq: Two times a day (BID) | RECTAL | Status: DC
  Administered 2014-02-19 – 2014-02-21 (×6): 10 mg via RECTAL
  Filled 2014-02-19 (×6): qty 1

## 2014-02-19 MED ORDER — ACETAMINOPHEN 325 MG PO TABS: 650.0000 mg | ORAL_TABLET | Freq: Four times a day (QID) | ORAL | Status: DC | PRN

## 2014-02-19 MED ORDER — PANTOPRAZOLE SODIUM 40 MG IV SOLR
40.0000 mg | Freq: Every day | INTRAVENOUS | Status: DC
  Administered 2014-02-19 – 2014-02-21 (×3): 40 mg via INTRAVENOUS
  Filled 2014-02-19 (×3): qty 40

## 2014-02-19 MED ORDER — LORAZEPAM 2 MG/ML IJ SOLN
1.0000 mg | INTRAMUSCULAR | Status: DC | PRN
  Administered 2014-02-19: 1 mg via INTRAVENOUS
  Filled 2014-02-19: qty 1

## 2014-02-19 NOTE — H&P (Signed)
[name] is an 63 y.o. male.   Chief Complaint: Nausea and vomiting HPI: 63 year old white male with multiple medical problems including intestinal dysfunction who was just discharged from [facility] for conservative treatment of his bowel dysfunction on 02/17/2014 who presented back yesterday evening with abdominal pain and an episode of emesis. He states he was passing gas and had some bowel movements yesterday. He started having increasing abdominal pain and had an episode of emesis. He presented the emergency room and a CAT scan revealed persistent dilatation of the small bowel. Mild edema was also noted in the small bowel wall.  Past Medical History  Diagnosis Date  . Pulmonary embolism 02/2010  . Hypertension   . Osteoporosis   . Arthritis   . TIA (transient ischemic attack) 10/11  . ETOH abuse     quit 03/2010  . 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 Personal history of PE (pulmonary embolism) 10/01/2010  . Blood transfusion   . S/P endoscopy September 28, 2010    erosive reflux esophagitis, Billroth I anatomy  . Shortness of breath   . Sleep apnea   . Recurrent upper respiratory infection (URI)   . Anxiety   . Pneumonia   . Anemia   . Ileus   . Chronic abdominal pain   . GERD (gastroesophageal reflux disease)   . Chest pain at rest   . Erosive esophagitis   . Vitamin B12 deficiency   . Bowel obstruction 05/13/2012    Recurrent  . Hx of Clostridium difficile infection 01/2012  . Bronchitis   . 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 Hernia repair      right inguinal  . Appendectomy  1980s  . Cholecystectomy  1980s  . Portacath placement    . Abdominal sugery      for bowel obstruction x 8, all in 1980s, except for one in 07/2010  . Esophagogastroduodenoscopy  09/28/2010  . Esophagogastroduodenoscopy  12/01/2010    Cervical web status post dilation, erosive esophagitis, B1  hemigastrectomy, inflamed anastomosis  . Colonoscopy  03/18/2011    anastomosis at 35cm. Several adenomatous polyps removed. Sigmoid diverticulosis. Next TCS 02/2013  . Esophagogastroduodenoscopy  04/16/2011    excoriation at GEJ c/w trauma/M-W tear, friable gastric anastomosis, dilation efferent limb  . Billroth 1 hemigastrectomy  1980s    per patient for benign duodenal tumor  . Esophagogastroduodenoscopy (egd) with esophageal dilation  02/25/2012    KGY:[id number] esophageal web-s/p dilation anddisruption as described above. Status post prior gastric with Billroth I configuration. Abnormal gastric mucosa at the anastomosis. Gastric biopsy showed mild chronic inflammation but no H. pylori   . Colonoscopy N/A 07/24/2012    FWY:OVZCHY post segmental resection with normal-appearing colonic anastomosis aside from an adjacent polyp-removed as described above. Rectal polyp-removed as described above. CT findings appear to have been artifactual. tubular adenomas/prolapsed type polyp.  . Colon surgery  May 2012    left hemicolectomy, colon cancer found at time of surgery for bowel obstruction  . Cardiac catheterization  07/17/2012    Family History  Problem Relation Age of Onset  . Hypertension Mother   . Arthritis Mother   . Pneumonia Mother   . Hypertension Father   . Heart attack Father    Social History:  reports that he quit smoking about 14 months ago. His smoking use included Cigarettes. He has a 20 pack-year smoking history.  He has never used smokeless tobacco. He reports that he does not drink alcohol or use illicit drugs.  Allergies: No Known Allergies  Medications Prior to Admission  Medication Sig Dispense Refill  . albuterol (PROAIR HFA) 108 (90 BASE) MCG/ACT inhaler Inhale into the lungs every 6 (six) hours as needed for wheezing or shortness of breath.    [name] alendronate (FOSAMAX) 70 MG tablet Take 70 mg by mouth every 7 (seven) days. On sundays    . butalbital-acetaminophen-caffeine  (FIORICET, ESGIC) 50-325-40 MG per tablet 1 tablet 3 (three) times daily as needed for headache or migraine.     . cholecalciferol (VITAMIN D) 1000 UNITS tablet Take 1,000 Units by mouth every morning.    . cyclobenzaprine (FLEXERIL) 10 MG tablet Take 10 mg by mouth daily as needed for muscle spasms.     [name] dexlansoprazole (DEXILANT) 60 MG capsule Take 60 mg by mouth every morning.    . dicyclomine (BENTYL) 20 MG tablet Take 1 tablet by mouth daily as needed for spasms.     [name] docusate sodium (COLACE) 100 MG capsule Take 100 mg by mouth 2 (two) times daily.    [name] enoxaparin (LOVENOX) 60 MG/0.6ML injection Inject 60 mg into the skin every evening.     [name] ENSURE PLUS (ENSURE PLUS) LIQD Take 237 mLs by mouth daily. Every other day    . ferrous [id number] C-folic acid (FEROCON) capsule Take 1 capsule by mouth daily.    . ferrous sulfate 325 (65 FE) MG tablet Take 325 mg by mouth daily with breakfast.    . folic acid (FOLVITE) 1 MG tablet Take 1 mg by mouth every morning.     [name] LORazepam (ATIVAN) 1 MG tablet Take 1 mg by mouth every 4 (four) hours as needed. anxiety    . Melatonin 10 MG CAPS Take 10 mg by mouth at bedtime.    . Multiple Vitamins-Minerals (MULTIVITAMINS THER. W/MINERALS) TABS tablet Take 1 tablet by mouth daily.    . ondansetron (ZOFRAN) 4 MG tablet Take 4 mg by mouth daily as needed for nausea.    . polyethylene glycol powder (GLYCOLAX/MIRALAX) powder Take 17 g by mouth as needed. For constipation    . Probiotic Product (PROBIOTIC DAILY) CAPS Take 1 capsule by mouth daily.    . prochlorperazine (COMPAZINE) 10 MG tablet Take 10 mg by mouth every 6 (six) hours as needed. Nausea and Vomiting    . sertraline (ZOLOFT) 100 MG tablet Take 150 mg by mouth at bedtime.     . simvastatin (ZOCOR) 10 MG tablet Take 10 mg by mouth at bedtime.     . sucralfate (CARAFATE) 1 G tablet Take 1 g by mouth 4 (four) times daily.    . sucralfate (CARAFATE) 1 GM/10ML suspension Take 10 mLs (1 g total)  by mouth 4 (four) times daily. As needed 420 mL 1  . thiamine 100 MG tablet Take 100 mg by mouth every morning.     . traMADol (ULTRAM) 50 MG tablet Take 50 mg by mouth every 6 (six) hours as needed for moderate pain or severe pain.     . valsartan-hydrochlorothiazide (DIOVAN-HCT) 80-12.5 MG per tablet Take 1 tablet by mouth every morning.     . Vitamin D, Ergocalciferol, (DRISDOL) 50000 UNITS CAPS Take 50,000 Units by mouth once a week. Takes on Sunday    . Wheat Dextrin (BENEFIBER PO) Take 1 each by mouth daily as needed (to regulate bowel movements/for supplement).     [name]  cyanocobalamin (,VITAMIN B-12,) 1000 MCG/ML injection Inject 1,000 mcg into the muscle every 30 (thirty) days.    [name] psyllium (REGULOID) 0.52 G capsule Take 5 capsules by mouth 2 (two) times daily.       Results for orders placed or performed during the hospital encounter of 02/18/14 (from the past 48 hour(s))  CBC with Differential     Status: Abnormal   Collection Time: 02/18/14  6:25 PM  Result Value Ref Range   WBC 10.0 4.0 - 10.5 K/uL   RBC 4.26 4.22 - 5.81 MIL/uL   Hemoglobin 13.5 13.0 - 17.0 g/dL   HCT 38.3 (L) 39.0 - 52.0 %   MCV 89.9 78.0 - 100.0 fL   MCH 31.7 26.0 - 34.0 pg   MCHC 35.2 30.0 - 36.0 g/dL   RDW 13.5 11.5 - 15.5 %   Platelets 236 150 - 400 K/uL   Neutrophils Relative % 69 43 - 77 %   Neutro Abs 6.9 1.7 - 7.7 K/uL   Lymphocytes Relative 22 12 - 46 %   Lymphs Abs 2.2 0.7 - 4.0 K/uL   Monocytes Relative 9 3 - 12 %   Monocytes Absolute 0.9 0.1 - 1.0 K/uL   Eosinophils Relative 0 0 - 5 %   Eosinophils Absolute 0.0 0.0 - 0.7 K/uL   Basophils Relative 0 0 - 1 %   Basophils Absolute 0.0 0.0 - 0.1 K/uL  Comprehensive metabolic panel     Status: Abnormal   Collection Time: 02/18/14  6:25 PM  Result Value Ref Range   Sodium 142 137 - 147 mEq/L   Potassium 3.3 (L) 3.7 - 5.3 mEq/L   Chloride 100 96 - 112 mEq/L   CO2 27 19 - 32 mEq/L   Glucose, Bld 114 (H) 70 - 99 mg/dL   BUN 7 6 - 23 mg/dL    Creatinine, Ser 0.96 0.50 - 1.35 mg/dL   Calcium 9.1 8.4 - 10.5 mg/dL   Total Protein 6.6 6.0 - 8.3 g/dL   Albumin 3.4 (L) 3.5 - 5.2 g/dL   AST 14 0 - 37 U/L   ALT 8 0 - 53 U/L   Alkaline Phosphatase 73 39 - 117 U/L   Total Bilirubin 0.3 0.3 - 1.2 mg/dL   GFR calc non Af Amer 86 (L) >90 mL/min   GFR calc Af Amer >90 >90 mL/min    Comment: (NOTE) The eGFR has been calculated using the CKD EPI equation. This calculation has not been validated in all clinical situations. eGFR's persistently <90 mL/min signify possible Chronic Kidney Disease.    Anion gap 15 5 - 15  Lipase, blood     Status: None   Collection Time: 02/18/14  6:25 PM  Result Value Ref Range   Lipase 53 11 - 59 U/L  Urinalysis, Routine w reflex microscopic     Status: Abnormal   Collection Time: 02/18/14  7:26 PM  Result Value Ref Range   Color, Urine YELLOW YELLOW   APPearance CLEAR CLEAR   Specific Gravity, Urine <1.005 (L) 1.005 - 1.030   pH 5.5 5.0 - 8.0   Glucose, UA NEGATIVE NEGATIVE mg/dL   Hgb urine dipstick NEGATIVE NEGATIVE   Bilirubin Urine NEGATIVE NEGATIVE   Ketones, ur NEGATIVE NEGATIVE mg/dL   Protein, ur NEGATIVE NEGATIVE mg/dL   Urobilinogen, UA 0.2 0.0 - 1.0 mg/dL   Nitrite NEGATIVE NEGATIVE   Leukocytes, UA NEGATIVE NEGATIVE    Comment: MICROSCOPIC NOT DONE ON URINES WITH NEGATIVE PROTEIN,  BLOOD, LEUKOCYTES, NITRITE, OR GLUCOSE <1000 mg/dL.   Ct Abdomen Pelvis Wo Contrast  02/18/2014   CLINICAL DATA:  Left upper quadrant abdominal pain, nausea and vomiting. Recent partial small bowel obstruction.  EXAM: CT ABDOMEN AND PELVIS WITHOUT CONTRAST  TECHNIQUE: Multidetector CT imaging of the abdomen and pelvis was performed following the standard protocol without IV contrast.  COMPARISON:  02/08/2014  FINDINGS: There is evidence of increased distention of the stomach, duodenum and proximal small bowel since the prior study with proximal jejunum reaching a maximum caliber of 6 cm. In the anterior mid  abdomen, prominent thickened loops of small bowel are identified which has progressed since the prior study. Maximum wall thickness approaches 13 mm. There is no evidence of bowel perforation or focal abscess. Some mild edema is present throughout the mesenteries.  There is evidence of prior surgery at the level of the stomach, proximal small bowel and rectosigmoid colon. The gallbladder has been removed. Unenhanced appearance of the liver, pancreas, spleen and adrenal glands is unremarkable. Small nonobstructing calculi are present in both kidneys. Stable right renal cyst. The bladder is decompressed. No hernias are identified. No enlarged lymph nodes are seen. Stable small hiatal hernia.  IMPRESSION: Increased gastric, duodenal and proximal small bowel dilatation consistent with small bowel obstruction. There is increase thickening of small bowel loops in the anterior mid abdomen. No evidence of bowel perforation or focal abscess.   Electronically Signed   By: Aletta Edouard M.D.   On: 02/18/2014 21:39    Review of Systems  Constitutional: Positive for malaise/fatigue.  HENT: Negative.   Eyes: Negative.   Cardiovascular: Negative.   Gastrointestinal: Positive for nausea, vomiting and abdominal pain.  Genitourinary: Negative.   Musculoskeletal: Negative.   Skin: Negative.     Blood pressure 175/83, pulse 66, temperature 98.5 F (36.9 C), temperature source Oral, resp. rate 20, height '5\' 9"'  (1.753 m), weight 61.326 kg (135 lb 3.2 oz), SpO2 97 %. Physical Exam  Vitals reviewed. Constitutional: He is oriented to person, place, and time. He appears well-developed and well-nourished.  HENT:  Head: Atraumatic.  Neck: Normal range of motion. Neck supple.  Cardiovascular: Normal rate, regular rhythm and normal heart sounds.   Respiratory: Effort normal and breath sounds normal.  GI: Soft. There is tenderness. There is no rebound.  Soft with minimal distention noted. Is tender in the periumbilical  region. No rigidity noted. Limited bowel sounds appreciated.  Neurological: He is alert and oriented to person, place, and time.  Skin: Skin is warm and dry.     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 Plan: Patient is already placed his own NG tube. I have told him in the past that he needs to try this manage this at home prior to rushing to the emergency room. He is difficult to take care of due to his multiple abdominal surgeries and comorbidities. Surgical intervention would be difficult due to his significant adhesive disease.  Anabela Crayton A 02/19/2014, 8:22 AM

## 2014-02-19 NOTE — Care Management Note (Unsigned)
    Page 1 of 1   02/19/2014     1:13:54 PM CARE MANAGEMENT NOTE 02/19/2014  Patient:  [name], [name]   Account Number:  [id number]  Date Initiated:  02/19/2014  Documentation initiated by:  Theophilus Kinds  Subjective/Objective Assessment:   Pt admitted from home with SBO. Pt lives with his wife and will return home at discharge. Pt is independent with ADL's.     Action/Plan:   Will continue to follow for discharge planning needs.   Anticipated DC Date:  02/25/2014   Anticipated DC Plan:  [place]  CM consult      Choice offered to / List presented to:             Status of service:  In process, will continue to follow Medicare Important Message given?   (If response is "NO", the following Medicare IM given date fields will be blank) Date Medicare IM given:   Medicare IM given by:   Date Additional Medicare IM given:   Additional Medicare IM given by:    Discharge Disposition:    Per UR Regulation:    If discussed at Long Length of Stay Meetings, dates discussed:    Comments:  02/19/14 [place], RN BSN CM

## 2014-02-19 NOTE — Progress Notes (Signed)
UR chart review completed.  

## 2014-02-19 NOTE — Progress Notes (Signed)
Offered to flush pt's NG tube. Pt states it is flowing just fine at this time and we will see what it looks like later in the morning.

## 2014-02-20 NOTE — Progress Notes (Signed)
  Subjective: States he feels slightly better with less abdominal pain.  Objective: Vital signs in last 24 hours: Temp:  [98.2 F (36.8 C)-98.9 F (37.2 C)] 98.6 F (37 C) (12/02 0405) Pulse Rate:  [66-80] 70 (12/02 0405) Resp:  [20] 20 (12/02 0405) BP: (148-164)/(71-92) 154/89 mmHg (12/02 0405) SpO2:  [97 %-100 %] 100 % (12/02 0405) Last BM Date: 02/18/14  Intake/Output from previous day: 12/01 0701 - 12/02 0700 In: 1211.3 [I.V.:1211.3] Out: 2600 [Urine:1650; Emesis/NG output:950] Intake/Output this shift: Total I/O In: -  Out: 500 [Urine:500]  General appearance: alert, cooperative and no distress Resp: clear to auscultation bilaterally Cardio: regular rate and rhythm, S1, S2 normal, no murmur, click, rub or gallop GI: Soft, less distended. Occasional bowel sounds heard, though infrequent.  Lab Results:   Recent Labs  02/18/14 1825  WBC 10.0  HGB 13.5  HCT 38.3*  PLT 236   BMET  Recent Labs  02/18/14 1825  NA 142  K 3.3*  CL 100  CO2 27  GLUCOSE 114*  BUN 7  CREATININE 0.96  CALCIUM 9.1   PT/INR No results for input(s): LABPROT, INR in the last 72 hours.  Studies/Results: Ct Abdomen Pelvis Wo Contrast  02/18/2014   CLINICAL DATA:  Left upper quadrant abdominal pain, nausea and vomiting. Recent partial small bowel obstruction.  EXAM: CT ABDOMEN AND PELVIS WITHOUT CONTRAST  TECHNIQUE: Multidetector CT imaging of the abdomen and pelvis was performed following the standard protocol without IV contrast.  COMPARISON:  02/08/2014  FINDINGS: There is evidence of increased distention of the stomach, duodenum and proximal small bowel since the prior study with proximal jejunum reaching a maximum caliber of 6 cm. In the anterior mid abdomen, prominent thickened loops of small bowel are identified which has progressed since the prior study. Maximum wall thickness approaches 13 mm. There is no evidence of bowel perforation or focal abscess. Some mild edema is present  throughout the mesenteries.  There is evidence of prior surgery at the level of the stomach, proximal small bowel and rectosigmoid colon. The gallbladder has been removed. Unenhanced appearance of the liver, pancreas, spleen and adrenal glands is unremarkable. Small nonobstructing calculi are present in both kidneys. Stable right renal cyst. The bladder is decompressed. No hernias are identified. No enlarged lymph nodes are seen. Stable small hiatal hernia.  IMPRESSION: Increased gastric, duodenal and proximal small bowel dilatation consistent with small bowel obstruction. There is increase thickening of small bowel loops in the anterior mid abdomen. No evidence of bowel perforation or focal abscess.   Electronically Signed   By: Aletta Edouard M.D.   On: 02/18/2014 21:39    Anti-infectives: Anti-infectives    None      Assessment/Plan: Impression: Small bowel obstruction with intestinal atony, slowly improving Plan: Continue NG tube decompression. No need for acute surgical intervention.  LOS: 2 days    Velina Drollinger A 02/20/2014

## 2014-02-21 NOTE — Progress Notes (Signed)
  Subjective: Patient feels much better. No nausea noted.  Objective: Vital signs in last 24 hours: Temp:  [98.4 F (36.9 C)-99 F (37.2 C)] 98.8 F (37.1 C) (12/03 0518) Pulse Rate:  [67-95] 67 (12/03 0518) Resp:  [20] 20 (12/03 0518) BP: (144-153)/(68-95) 153/68 mmHg (12/03 0518) SpO2:  [97 %-98 %] 97 % (12/03 0518) Last BM Date: 02/21/11  Intake/Output from previous day: 12/02 0701 - 12/03 0700 In: 1575 [I.V.:825; NG/GT:750] Out: 1850 [Urine:1400; Emesis/NG output:450] Intake/Output this shift:    General appearance: alert, cooperative and no distress Resp: clear to auscultation bilaterally Cardio: regular rate and rhythm, S1, S2 normal, no murmur, click, rub or gallop GI: soft, non-tender; bowel sounds normal; no masses,  no organomegaly  Lab Results:   Recent Labs  02/18/14 1825  WBC 10.0  HGB 13.5  HCT 38.3*  PLT 236   BMET  Recent Labs  02/18/14 1825  NA 142  K 3.3*  CL 100  CO2 27  GLUCOSE 114*  BUN 7  CREATININE 0.96  CALCIUM 9.1   PT/INR No results for input(s): LABPROT, INR in the last 72 hours.  Studies/Results: No results found.  Anti-infectives: Anti-infectives    None      Assessment/Plan: Impression: Small bowel obstruction/intestinal atony, slowly resolving Plan: We will remove gastric tube. Advance diet as tolerated.  LOS: 3 days    [name] A 02/21/2014

## 2014-02-22 MED ORDER — HEPARIN SOD (PORK) LOCK FLUSH 100 UNIT/ML IV SOLN
500.0000 [IU] | INTRAVENOUS | Status: AC | PRN
  Administered 2014-02-22: 500 [IU]

## 2014-02-22 MED ORDER — ONDANSETRON HCL 4 MG PO TABS: 4.0000 mg | ORAL_TABLET | Freq: Three times a day (TID) | ORAL | Status: DC | PRN

## 2014-02-22 MED ORDER — HEPARIN SOD (PORK) LOCK FLUSH 100 UNIT/ML IV SOLN
INTRAVENOUS | Status: AC
  Filled 2014-02-22: qty 5

## 2014-02-22 NOTE — Progress Notes (Signed)
Pt discharged home today, No CM needs noted.

## 2014-02-22 NOTE — Discharge Instructions (Signed)
Intestinal Pseudo-Obstruction Intestinal false blockage is a condition that causes symptoms like those of a bowel blockage (obstruction). It is also called pseudo-obstruction. But when the intestines are examined, no obstruction is found. A problem in how the muscles and nerves in the intestines work causes the symptoms. SYMPTOMS   Cramps.  Stomach pain.  Nausea and vomiting.  Bloating.  Fewer bowel movements than usual.  Loose stools. Over time, pseudo-obstruction can cause:  Bacterial infections.  Malnutrition.  Muscle problems in other parts of the body.  Some people also have bladder problems. CAUSES   Diseases that affect muscles and nerves can cause similar symptoms. These diseases include:  Lupus erythematosus.  Scleroderma.  Parkinson's disease.  When a disease causes the symptoms, the condition is called secondary intestinal pseudo-obstruction.  Medications that affect muscles and nerves might also cause secondary pseudo-obstruction. Examples include:  Opiates.  Antidepressants. DIAGNOSIS  Your caregiver:  May take a complete medical history.  May do a physical exam.  May take X-rays. TREATMENT   Intravenous feeding (nutritional support). This will prevent malnutrition.  Antibiotics. These are used to treat bacterial infections.  Medication. This might also be given to treat intestinal muscle problems.  In severe cases, surgery might be needed. It would be used to remove part of the intestine. Document Released: 01/03/2009 Document Revised: 05/31/2011 Document Reviewed: 06/13/2013 [facility] Patient Information 2015 [place], [place]. This information is not intended to replace advice given to you by your health care provider. Make sure you discuss any questions you have with your health care provider.

## 2014-02-22 NOTE — Discharge Summary (Signed)
Physician Discharge Summary  [name] MRN: [id number] DOB/AGE: February 03, 1951 63 y.o.  Admit date: 02/18/2014 Discharge date: 02/22/2014  Admission Diagnoses: Partial small bowel obstruction, intestinal atony  Discharge Diagnoses: Same Active Problems:   Small bowel obstruction   SBO (small bowel obstruction)   Discharged Condition: good  Hospital Course: Patient is a 63 year old white male who presented emergency room with worsening nausea and vomiting. 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 He placed his own NG tube. He subsequently has improved without surgery. He is being discharged home in good improving condition.  Discharge Exam: Blood pressure 124/65, pulse 67, temperature 99.1 F (37.3 C), temperature source Oral, resp. rate 20, height 5\' 9"  (1.753 m), weight 61.326 kg (135 lb 3.2 oz), SpO2 97 %. General appearance: alert, cooperative and no distress Resp: clear to auscultation bilaterally Cardio: regular rate and rhythm, S1, S2 normal, no murmur, click, rub or gallop GI: soft, non-tender; bowel sounds normal; no masses,  no organomegaly  Disposition: 01-Home or Self Care     Medication List    TAKE these medications        alendronate 70 MG tablet  Commonly known as:  FOSAMAX  Take 70 mg by mouth every 7 (seven) days. On sundays     BENEFIBER PO  Take 1 each by mouth daily as needed (to regulate bowel movements/for supplement).     butalbital-acetaminophen-caffeine 50-325-40 MG per tablet  Commonly known as:  FIORICET, ESGIC  1 tablet 3 (three) times daily as needed for headache or migraine.     cholecalciferol 1000 UNITS tablet  Commonly known as:  VITAMIN D  Take 1,000 Units by mouth every morning.     cyanocobalamin 1000 MCG/ML injection  Commonly known as:  (VITAMIN B-12)  Inject 1,000 mcg into the muscle every 30 (thirty) days.      cyclobenzaprine 10 MG tablet  Commonly known as:  FLEXERIL  Take 10 mg by mouth daily as needed for muscle spasms.     dexlansoprazole 60 MG capsule  Commonly known as:  DEXILANT  Take 60 mg by mouth every morning.     dicyclomine 20 MG tablet  Commonly known as:  BENTYL  Take 1 tablet by mouth daily as needed for spasms.     docusate sodium 100 MG capsule  Commonly known as:  COLACE  Take 100 mg by mouth 2 (two) times daily.     enoxaparin 60 MG/0.6ML injection  Commonly known as:  LOVENOX  Inject 60 mg into the skin every evening.     ENSURE PLUS Liqd  Take 237 mLs by mouth daily. Every other day     FEROCON capsule  Generic drug:  ferrous [id number] C-folic acid  Take 1 capsule by mouth daily.     ferrous sulfate 325 (65 FE) MG tablet  Take 325 mg by mouth daily with breakfast.     folic acid 1 MG tablet  Commonly known as:  FOLVITE  Take 1 mg by mouth every morning.     LORazepam 1 MG tablet  Commonly known as:  ATIVAN  Take 1 mg by mouth every 4 (four) hours as needed. anxiety     Melatonin 10 MG Caps  Take 10 mg by mouth at bedtime.     multivitamins ther. w/minerals Tabs tablet  Take 1 tablet by mouth daily.     ondansetron 4 MG tablet  Commonly known  as:  ZOFRAN  Take 1 tablet (4 mg total) by mouth every 8 (eight) hours as needed for nausea.     polyethylene glycol powder powder  Commonly known as:  GLYCOLAX/MIRALAX  Take 17 g by mouth as needed. For constipation     PROAIR HFA 108 (90 BASE) MCG/ACT inhaler  Generic drug:  albuterol  Inhale into the lungs every 6 (six) hours as needed for wheezing or shortness of breath.     PROBIOTIC DAILY Caps  Take 1 capsule by mouth daily.     prochlorperazine 10 MG tablet  Commonly known as:  COMPAZINE  Take 10 mg by mouth every 6 (six) hours as needed. Nausea and Vomiting     psyllium 0.52 G capsule  Commonly known as:  REGULOID  Take 5 capsules by mouth 2 (two) times daily.     sertraline  100 MG tablet  Commonly known as:  ZOLOFT  Take 150 mg by mouth at bedtime.     simvastatin 10 MG tablet  Commonly known as:  ZOCOR  Take 10 mg by mouth at bedtime.     sucralfate 1 G tablet  Commonly known as:  CARAFATE  Take 1 g by mouth 4 (four) times daily.     sucralfate 1 GM/10ML suspension  Commonly known as:  CARAFATE  Take 10 mLs (1 g total) by mouth 4 (four) times daily. As needed     thiamine 100 MG tablet  Take 100 mg by mouth every morning.     traMADol 50 MG tablet  Commonly known as:  ULTRAM  Take 50 mg by mouth every 6 (six) hours as needed for moderate pain or severe pain.     valsartan-hydrochlorothiazide 80-12.5 MG per tablet  Commonly known as:  DIOVAN-HCT  Take 1 tablet by mouth every morning.     Vitamin D (Ergocalciferol) 50000 UNITS Caps capsule  Commonly known as:  DRISDOL  Take 50,000 Units by mouth once a week. Takes on Sunday           Follow-up Information    Follow up with Jamesetta So, MD.   Specialty:  General Surgery   Why:  As needed   Contact information:   [id number] [place] [address] [phone]       Signed: Aviva Signs A 02/22/2014, 9:18 AM

## 2014-02-22 NOTE — Plan of Care (Signed)
Problem: Phase I Progression Outcomes Goal: Pain controlled with appropriate interventions Outcome: Completed/Met Date Met:  02/22/14 Goal: OOB as tolerated unless otherwise ordered Outcome: Completed/Met Date Met:  02/22/14 Goal: Voiding-avoid urinary catheter unless indicated Outcome: Completed/Met Date Met:  02/22/14 Goal: Hemodynamically stable Outcome: Completed/Met Date Met:  02/22/14

## 2014-02-22 NOTE — Progress Notes (Signed)
Patient discharged with instructions, prescription, and care notes.  Verbalized understanding via teach back.  Port  was removed and the site was WNL. Patient voiced no further complaints or concerns at the time of discharge.  Appointments scheduled per instructions.  Patient left the floor via w/c with staff and family in stable condition.

## 2014-02-25 NOTE — Progress Notes (Signed)
UR chart review completed.  

## 2014-03-01 NOTE — Progress Notes (Signed)
UR chart review completed.  

## 2014-03-03 DIAGNOSIS — K56609 Unspecified intestinal obstruction, unspecified as to partial versus complete obstruction: Secondary | ICD-10-CM

## 2014-03-03 HISTORY — DX: Unspecified intestinal obstruction, unspecified as to partial versus complete obstruction: K56.609

## 2014-03-09 ENCOUNTER — Other Ambulatory Visit: Payer: Self-pay | Admitting: Nurse Practitioner

## 2014-03-28 NOTE — Progress Notes (Signed)
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  Recurrent SBO, felt to be secondary to adhesions  Dr. Sydell Axon is GI Physician  CURRENT THERAPY: Observation  INTERVAL HISTORY: [name] 64 y.o. male returns for follow-up of his stage III CRC.  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 He reports primarily tingling. He has a hisory of PE in November 2011 and December 2012. He is on lovenox. He was deemed a coumadin failure after his second PE.   MEDICAL HISTORY: Past Medical History  Diagnosis Date  . Pulmonary embolism 02/2010  . Hypertension   . Osteoporosis   . Arthritis   . TIA (transient ischemic attack) 10/11  . ETOH abuse     quit 03/2010  . 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 Personal history of PE (pulmonary embolism) 10/01/2010  . Blood transfusion   . S/P endoscopy September 28, 2010    erosive reflux esophagitis, Billroth I anatomy  . Shortness of breath   . Sleep apnea   . Recurrent upper respiratory infection (URI)   . Anxiety   . Pneumonia   . Anemia   . Ileus   . Chronic abdominal pain   . GERD (gastroesophageal reflux disease)   . Chest pain at rest   . Erosive esophagitis   . Vitamin B12 deficiency   . Bowel obstruction 05/13/2012    Recurrent  . Hx of Clostridium difficile infection 01/2012  . Bronchitis   . 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 Obstruction of bowel 03/03/14    has Adenocarcinoma of colon with mucinous features; Bleeding gastrointestinal; Supratherapeutic INR; Abdominal  pain, other specified site; Nausea and vomiting in adult; Anemia, chronic disease; Fever chills; S/P partial gastrectomy; Personal history of PE (pulmonary embolism); Bronchitis, acute; Erosive esophagitis; Esophageal reflux disease; Coffee ground emesis; Hematemesis; Chronic abdominal  pain; Pancytopenia due to antineoplastic chemotherapy; Pneumonia; Pulmonary embolism; Chest pain at rest; Small bowel obstruction; Left sided abdominal pain; GI bleed; Coagulopathy; Gastroenteritis; Partial small bowel obstruction; Nausea; Abdominal distension; HTN (hypertension); Diarrhea; Cellulitis; Esophageal dysphagia; H/O Clostridium difficile infection; Rectal bleeding; Bowel obstruction; Abnormal CT scan, colon; and SBO (small bowel obstruction) on his problem list.     No history exists.     has No Known Allergies.  We administered heparin lock flush and sodium chloride.  SURGICAL HISTORY: Past Surgical History  Procedure Laterality Date  . Hernia repair      right inguinal  . Appendectomy  1980s  . Cholecystectomy  1980s  . Portacath placement    . Abdominal sugery      for bowel obstruction x 8, all in 1980s, except for one in 07/2010  . Esophagogastroduodenoscopy  09/28/2010  . Esophagogastroduodenoscopy  12/01/2010    Cervical web status post dilation, erosive esophagitis, B1 hemigastrectomy, inflamed anastomosis  . Colonoscopy  03/18/2011    anastomosis at 35cm. Several adenomatous polyps removed. Sigmoid diverticulosis. Next TCS 02/2013  . Esophagogastroduodenoscopy  04/16/2011    excoriation at GEJ c/w trauma/M-W tear, friable gastric anastomosis, dilation efferent limb  . Billroth 1 hemigastrectomy  1980s    per patient for benign duodenal tumor  . Esophagogastroduodenoscopy (egd) with esophageal dilation  02/25/2012    RXV:[id number] esophageal web-s/p dilation anddisruption as described above. Status post prior gastric with Billroth I configuration.  Abnormal gastric mucosa at the anastomosis. Gastric biopsy showed mild chronic inflammation but no H. pylori   . Colonoscopy N/A 07/24/2012    CWC:BJSEGB post segmental resection with normal-appearing colonic anastomosis aside from an adjacent polyp-removed as described above. Rectal polyp-removed as described above. CT findings  appear to have been artifactual. tubular adenomas/prolapsed type polyp.  . Colon surgery  May 2012    left hemicolectomy, colon cancer found at time of surgery for bowel obstruction  . Cardiac catheterization  07/17/2012  . Abdominal adhesion surgery  03/04/15    @ [facility]    SOCIAL HISTORY: History   Social History  . Marital Status: Married    Spouse Name: N/A    Number of Children: 3  . Years of Education: N/A   Occupational History  .  [employer]   Social History Main Topics  . Smoking status: Former Smoker -- 0.50 packs/day for 40 years    Types: Cigarettes    Quit date: 12/20/2012  . Smokeless tobacco: Never Used     Comment: Quit x 1 year  . Alcohol Use: No     Comment: last drink was Apr 16 2010  . Drug Use: No  . Sexual Activity: No   Other Topics Concern  . Not on file   Social History Narrative    FAMILY HISTORY: Family History  Problem Relation Age of Onset  . Hypertension Mother   . Arthritis Mother   . Pneumonia Mother   . Hypertension Father   . Heart attack Father     Review of Systems  Constitutional: Negative for fever, chills, weight loss and malaise/fatigue.  HENT: Negative for congestion, hearing loss, nosebleeds, sore throat and tinnitus.   Eyes: Negative for blurred vision, double vision, pain and discharge.  Respiratory: Negative for cough, hemoptysis, sputum production, shortness of breath and wheezing.   Cardiovascular: Negative for chest pain, palpitations, claudication, leg swelling and PND.  Gastrointestinal: Negative for heartburn, nausea, vomiting, abdominal pain, diarrhea, constipation, blood in stool and melena.  Genitourinary: Negative for dysuria, urgency, frequency and hematuria.  Musculoskeletal: Negative for myalgias, joint pain and falls.  Skin: Negative for itching and rash.  Neurological: Positive for tingling. Negative for dizziness, tremors, sensory change, speech change, focal weakness, seizures, loss of  consciousness, weakness and headaches.  Endo/Heme/Allergies: Does not bruise/bleed easily.  Psychiatric/Behavioral: Negative for depression, suicidal ideas, memory loss and substance abuse. The patient is not nervous/anxious and does not have insomnia.     PHYSICAL EXAMINATION  ECOG PERFORMANCE STATUS: 1 - Symptomatic but completely ambulatory  Filed Vitals:   03/29/14 0900  BP: 127/82  Pulse: 86  Temp: 98.2 F (36.8 C)  Resp: 16    Physical Exam  Constitutional: He is oriented to person, place, and time and well-developed, well-nourished, and in no distress.  HENT:  Head: Normocephalic and atraumatic.  Nose: Nose normal.  Mouth/Throat: Oropharynx is clear and moist. No oropharyngeal exudate.  Eyes: Conjunctivae and EOM are normal. Pupils are equal, round, and reactive to light. Right eye exhibits no discharge. Left eye exhibits no discharge. No scleral icterus.  Neck: Normal range of motion. Neck supple. No tracheal deviation present. No thyromegaly present.  Cardiovascular: Normal rate, regular rhythm and normal heart sounds.  Exam reveals no gallop and no friction rub.   No murmur heard. Pulmonary/Chest: Effort normal and breath sounds normal. He has no wheezes. He has no rales.  Abdominal: Soft. Bowel sounds are normal. He exhibits no distension and no  mass. There is no tenderness. There is no rebound and no guarding.  Musculoskeletal: Normal range of motion. He exhibits no edema.  Lymphadenopathy:    He has no cervical adenopathy.  Neurological: He is alert and oriented to person, place, and time. He has normal reflexes. No cranial nerve deficit. Gait normal. Coordination normal.  Skin: Skin is warm and dry. No rash noted.  Psychiatric: Mood, memory, affect and judgment normal.  Nursing note and vitals reviewed.   LABORATORY DATA:  CBC    Component Value Date/Time   WBC 10.0 02/18/2014 1825   RBC 4.26 02/18/2014 1825   RBC 3.21* 12/07/2010 0516   HGB 13.5  02/18/2014 1825   HCT 38.3* 02/18/2014 1825   PLT 236 02/18/2014 1825   MCV 89.9 02/18/2014 1825   MCH 31.7 02/18/2014 1825   MCHC 35.2 02/18/2014 1825   RDW 13.5 02/18/2014 1825   LYMPHSABS 2.2 02/18/2014 1825   MONOABS 0.9 02/18/2014 1825   EOSABS 0.0 02/18/2014 1825   BASOSABS 0.0 02/18/2014 1825   CMP     Component Value Date/Time   NA 142 02/18/2014 1825   K 3.3* 02/18/2014 1825   CL 100 02/18/2014 1825   CO2 27 02/18/2014 1825   GLUCOSE 114* 02/18/2014 1825   BUN 7 02/18/2014 1825   CREATININE 0.96 02/18/2014 1825   CALCIUM 9.1 02/18/2014 1825   PROT 6.6 02/18/2014 1825   ALBUMIN 3.4* 02/18/2014 1825   AST 14 02/18/2014 1825   ALT 8 02/18/2014 1825   ALKPHOS 73 02/18/2014 1825   BILITOT 0.3 02/18/2014 1825   GFRNONAA 86* 02/18/2014 1825   GFRAA >90 02/18/2014 1825    RADIOGRAPHIC STUDIES:  CLINICAL DATA: Left upper quadrant abdominal pain, nausea and vomiting. Recent partial small bowel obstruction.  EXAM: CT ABDOMEN AND PELVIS WITHOUT CONTRAST  TECHNIQUE: Multidetector CT imaging of the abdomen and pelvis was performed following the standard protocol without IV contrast.  COMPARISON: 02/08/2014  FINDINGS: There is evidence of increased distention of the stomach, duodenum and proximal small bowel since the prior study with proximal jejunum reaching a maximum caliber of 6 cm. In the anterior mid abdomen, prominent thickened loops of small bowel are identified which has progressed since the prior study. Maximum wall thickness approaches 13 mm. There is no evidence of bowel perforation or focal abscess. Some mild edema is present throughout the mesenteries.  There is evidence of prior surgery at the level of the stomach, proximal small bowel and rectosigmoid colon. The gallbladder has been removed. Unenhanced appearance of the liver, pancreas, spleen and adrenal glands is unremarkable. Small nonobstructing calculi are present in both kidneys.  Stable right renal cyst. The bladder is decompressed. No hernias are identified. No enlarged lymph nodes are seen. Stable small hiatal hernia.  IMPRESSION: Increased gastric, duodenal and proximal small bowel dilatation consistent with small bowel obstruction. There is increase thickening of small bowel loops in the anterior mid abdomen. No evidence of bowel perforation or focal abscess.   Electronically Signed  By: Aletta Edouard M.D.  On: 02/18/2014 21:39     ASSESSMENT and THERAPY PLAN:    Adenocarcinoma of colon with mucinous features Pleasant 64 year old male with a history of stage III colon cancer diagnosed in May 2012. He was treated for a total of 7 cycles of adjuvant FOLFOX. Additional FOLFOX was discontinued secondary to neuropathy. He has issues with recurrent small bowel obstruction felt to be secondary to adhesions last imaging study was done in November 2015 and showed no  evidence of obvious recurrence. We have recommended ongoing surveillance with follow-up in 6 months with laboratory studies including CBC and CMP, CEA.      All questions were answered. The patient knows to call the clinic with any problems, questions or concerns. We can certainly see the patient much sooner if necessary.   Molli Hazard 04/06/2014

## 2014-03-29 ENCOUNTER — Encounter (HOSPITAL_BASED_OUTPATIENT_CLINIC_OR_DEPARTMENT_OTHER): Payer: Medicare Other | Admitting: Hematology & Oncology

## 2014-03-29 ENCOUNTER — Encounter (HOSPITAL_COMMUNITY): Payer: Medicare Other | Attending: Hematology and Oncology

## 2014-03-29 ENCOUNTER — Encounter (HOSPITAL_COMMUNITY): Payer: Self-pay | Admitting: Hematology & Oncology

## 2014-03-29 VITALS — BP 127/82 | HR 86 | Temp 98.2°F | Resp 16 | Wt 135.4 lb

## 2014-03-29 DIAGNOSIS — C189 Malignant neoplasm of colon, unspecified: Secondary | ICD-10-CM

## 2014-03-29 DIAGNOSIS — G629 Polyneuropathy, unspecified: Secondary | ICD-10-CM

## 2014-03-29 DIAGNOSIS — Z85038 Personal history of other malignant neoplasm of large intestine: Secondary | ICD-10-CM | POA: Diagnosis present

## 2014-03-29 MED ORDER — HEPARIN SOD (PORK) LOCK FLUSH 100 UNIT/ML IV SOLN
500.0000 [IU] | Freq: Once | INTRAVENOUS | Status: AC
  Administered 2014-03-29: 500 [IU] via INTRAVENOUS
  Filled 2014-03-29: qty 5

## 2014-03-29 MED ORDER — LIDOCAINE-PRILOCAINE 2.5-2.5 % EX CREA: 1.0000 "application " | TOPICAL_CREAM | CUTANEOUS | Status: DC | PRN

## 2014-03-29 MED ORDER — SODIUM CHLORIDE 0.9 % IJ SOLN
10.0000 mL | INTRAMUSCULAR | Status: DC | PRN
  Administered 2014-03-29: 10 mL via INTRAVENOUS
  Filled 2014-03-29: qty 10

## 2014-03-29 NOTE — Progress Notes (Signed)
See office visit notes.

## 2014-03-29 NOTE — Patient Instructions (Signed)
[facility] Discharge Instructions  RECOMMENDATIONS MADE BY THE CONSULTANT AND ANY TEST RESULTS WILL BE SENT TO YOUR REFERRING PHYSICIAN.   I will call you with the results of your blood work today. Please make sure you call with any problems or concerns. We will see you back in 6 months.  Thank you for choosing [facility] at [facility] to  provide your oncology and hematology care. To afford each patient quality  time with our provider, please arrive at least 15 minutes before your  scheduled appointment time.  You need to re-schedule your appointment should you arrive 10 or more  minutes late. We strive to give you quality time with our providers, and  arriving late affects you and other patients whose appointments are after  yours. Also, if you no show three or more times for appointments you may  be dismissed from the clinic at the providers discretion.  Again, thank you for choosing [facility]. Our hope is that  these requests will decrease the amount of time that you wait before being seen by our physicians. _______________________________________________________________  Should you have questions after your visit to [facility], please contact our office at [phone] between the hours of 8:30 a.m. and 5:00 p.m.  Voicemails left after 4:30 p.m. will not be returned until the following business day.  For prescription refill requests, have your pharmacy contact our office with your prescription refill request.

## 2014-03-29 NOTE — Progress Notes (Signed)
[name] presented for labwork. Labs per MD order drawn via Portacath located in the right chest wall accessed with  H 20 needle. Good blood return present. Procedure without incident.  Needle removed intact. Patient tolerated procedure well.

## 2014-03-30 LAB — CEA: CEA: 3.3 ng/mL (ref 0.0–4.7)

## 2014-04-06 ENCOUNTER — Encounter (HOSPITAL_COMMUNITY): Payer: Self-pay | Admitting: Hematology & Oncology

## 2014-04-06 NOTE — Assessment & Plan Note (Signed)
Pleasant 64 year old male with a history of stage III colon cancer diagnosed in May 2012. He was treated for a total of 7 cycles of adjuvant FOLFOX. Additional FOLFOX was discontinued secondary to neuropathy. He has issues with recurrent small bowel obstruction felt to be secondary to adhesions last imaging study was done in November 2015 and showed no evidence of obvious recurrence. We have recommended ongoing surveillance with follow-up in 6 months with laboratory studies including CBC and CMP, CEA.

## 2014-04-15 ENCOUNTER — Encounter: Payer: Self-pay | Admitting: Internal Medicine

## 2014-05-03 ENCOUNTER — Ambulatory Visit: Payer: Federal, State, Local not specified - PPO | Admitting: Internal Medicine

## 2014-05-07 ENCOUNTER — Ambulatory Visit: Payer: Federal, State, Local not specified - PPO | Admitting: Internal Medicine

## 2014-05-10 ENCOUNTER — Encounter (HOSPITAL_COMMUNITY): Payer: Self-pay

## 2014-05-10 ENCOUNTER — Encounter (HOSPITAL_COMMUNITY): Payer: Federal, State, Local not specified - PPO | Attending: Hematology and Oncology

## 2014-05-10 DIAGNOSIS — Z452 Encounter for adjustment and management of vascular access device: Secondary | ICD-10-CM

## 2014-05-10 DIAGNOSIS — Z85038 Personal history of other malignant neoplasm of large intestine: Secondary | ICD-10-CM

## 2014-05-10 DIAGNOSIS — C189 Malignant neoplasm of colon, unspecified: Secondary | ICD-10-CM | POA: Insufficient documentation

## 2014-05-10 MED ORDER — HEPARIN SOD (PORK) LOCK FLUSH 100 UNIT/ML IV SOLN
500.0000 [IU] | Freq: Once | INTRAVENOUS | Status: AC
  Administered 2014-05-10: 500 [IU] via INTRAVENOUS
  Filled 2014-05-10: qty 5

## 2014-05-10 MED ORDER — SODIUM CHLORIDE 0.9 % IJ SOLN
10.0000 mL | INTRAMUSCULAR | Status: DC | PRN
  Administered 2014-05-10: 10 mL via INTRAVENOUS
  Filled 2014-05-10: qty 10

## 2014-05-10 NOTE — Progress Notes (Signed)
[name] presented for Portacath access and flush. Portacath located lt chest wall accessed with  H 20 needle. Good blood return present. Portacath flushed with 77ml NS and 500U/82ml Heparin and needle removed intact. Procedure without incident. Patient tolerated procedure well.

## 2014-05-24 ENCOUNTER — Ambulatory Visit (INDEPENDENT_AMBULATORY_CARE_PROVIDER_SITE_OTHER): Payer: Medicare Other | Admitting: Internal Medicine

## 2014-05-24 ENCOUNTER — Encounter: Payer: Self-pay | Admitting: Internal Medicine

## 2014-05-24 ENCOUNTER — Other Ambulatory Visit: Payer: Self-pay

## 2014-05-24 VITALS — BP 145/85 | HR 86 | Temp 97.0°F | Ht 69.0 in | Wt 144.4 lb

## 2014-05-24 DIAGNOSIS — R1314 Dysphagia, pharyngoesophageal phase: Secondary | ICD-10-CM

## 2014-05-24 DIAGNOSIS — K56609 Unspecified intestinal obstruction, unspecified as to partial versus complete obstruction: Secondary | ICD-10-CM

## 2014-05-24 DIAGNOSIS — K5669 Other intestinal obstruction: Secondary | ICD-10-CM

## 2014-05-24 DIAGNOSIS — R197 Diarrhea, unspecified: Secondary | ICD-10-CM

## 2014-05-24 DIAGNOSIS — Z85038 Personal history of other malignant neoplasm of large intestine: Secondary | ICD-10-CM

## 2014-05-24 DIAGNOSIS — K5909 Other constipation: Secondary | ICD-10-CM

## 2014-05-24 DIAGNOSIS — K219 Gastro-esophageal reflux disease without esophagitis: Secondary | ICD-10-CM | POA: Diagnosis not present

## 2014-05-24 NOTE — Progress Notes (Signed)
cc'ed to pcp °

## 2014-05-24 NOTE — Patient Instructions (Signed)
Schedule an EGD with possible ED - dysphagia - afternoon procedure no sooner than 2:30 pm  No change in lovenox regimen  May have clear liquids until noon day of procedure  Stop Miralax; Continue stool softener   Begin Benefiber 2 teaspoons twice daily

## 2014-05-24 NOTE — Progress Notes (Signed)
Primary Care Physician:  Netta Cedars, MD Primary Gastroenterologist:  Dr. [name]  Pre-Procedure History & Physical: HPI:  [name] is a 64 y.o. male here for  Follow-up of "constipation".  History stage III colon cancer status post left hemi-colectomy. Hospitalized here in [place] last fall and down at [facility] for small bowel obstruction. He spent almost the entire month of December 2015 in the hospital. He underwent laparotomy. No small bowel had to be resected as patient reports. He had a tough time with his "bowels waking up". Has gained 4 pounds since he was last seen here last year. GERD symptoms well controlled on Dexilant 60 mg daily. Has recurrent esophageal dysphagia (history of a cervical esophageal web dilated with marked improvement in symptoms back in 2013). 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  Has not had any melena or rectal bleeding.  States he's been on MiraLax and stool softener daily. Passes Bristol 1 stools and "liquid". Sometimes perceives difficulty evacuating at other times complaints of diarrhea. No fiber supplementation. MiraLax started empirically at the time of discharge from [facility].  Adenoma removed from his colon at the anastomosis in 2014; he will be due for surveillance colonoscopy 2017.  Past Medical History  Diagnosis Date  . Pulmonary embolism 02/2010  . Hypertension   . Osteoporosis   . Arthritis   . TIA (transient ischemic attack) 10/11  . ETOH abuse     quit 03/2010  . 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 Personal history of PE (pulmonary embolism) 10/01/2010  . Blood transfusion   . S/P endoscopy September 28, 2010    erosive reflux esophagitis, Billroth I anatomy  . Shortness of breath   . Sleep apnea   . Recurrent upper respiratory infection (URI)   . Anxiety   . Pneumonia   . Anemia   . Ileus   . Chronic abdominal pain   . GERD (gastroesophageal reflux  disease)   . Chest pain at rest   . Erosive esophagitis   . Vitamin B12 deficiency   . Bowel obstruction 05/13/2012    Recurrent  . Hx of Clostridium difficile infection 01/2012  . Bronchitis   . 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 Obstruction of bowel 03/03/14    Past Surgical History  Procedure Laterality Date  . Hernia repair      right inguinal  . Appendectomy  1980s  . Cholecystectomy  1980s  . Portacath placement    . Abdominal sugery      for bowel obstruction x 8, all in 1980s, except for one in 07/2010  . Esophagogastroduodenoscopy  09/28/2010  . Esophagogastroduodenoscopy  12/01/2010    Cervical web status post dilation, erosive esophagitis, B1 hemigastrectomy, inflamed anastomosis  . Colonoscopy  03/18/2011    anastomosis at 35cm. Several adenomatous polyps removed. Sigmoid diverticulosis. Next TCS 02/2013  . Esophagogastroduodenoscopy  04/16/2011    excoriation at GEJ c/w trauma/M-W tear, friable gastric anastomosis, dilation efferent limb  . Billroth 1 hemigastrectomy  1980s    per patient for benign duodenal tumor  . Esophagogastroduodenoscopy (egd) with esophageal dilation  02/25/2012    GEX:BMWUXLKG esophageal web-s/p dilation anddisruption as described above. Status post prior gastric with Billroth I configuration. Abnormal gastric mucosa at the anastomosis. Gastric biopsy showed mild chronic inflammation but no H. pylori   . Colonoscopy N/A 07/24/2012    MWN:[id number] post segmental resection  with normal-appearing colonic anastomosis aside from an adjacent polyp-removed as described above. Rectal polyp-removed as described above. CT findings appear to have been artifactual. tubular adenomas/prolapsed type polyp.  . Colon surgery  May 2012    left hemicolectomy, colon cancer found at time of surgery for bowel obstruction  . Cardiac catheterization  07/17/2012  . Abdominal adhesion surgery  03/04/15    @ [facility]    Prior to Admission  medications   Medication Sig Start Date End Date Taking? Authorizing Provider  albuterol (PROAIR HFA) 108 (90 BASE) MCG/ACT inhaler Inhale into the lungs every 6 (six) hours as needed for wheezing or shortness of breath.   Yes Historical Provider, MD  alendronate (FOSAMAX) 70 MG tablet Take 70 mg by mouth every 7 (seven) days. On sundays 10/14/11  Yes Historical Provider, MD  butalbital-acetaminophen-caffeine (FIORICET, ESGIC) 50-325-40 MG per tablet 1 tablet 3 (three) times daily as needed for headache or migraine.  04/10/13  Yes Historical Provider, MD  cholecalciferol (VITAMIN D) 1000 UNITS tablet Take 1,000 Units by mouth every morning.   Yes Historical Provider, MD  cyanocobalamin (,VITAMIN B-12,) 1000 MCG/ML injection Inject 1,000 mcg into the muscle every 30 (thirty) days.   Yes Historical Provider, MD  cyclobenzaprine (FLEXERIL) 10 MG tablet Take 10 mg by mouth daily as needed for muscle spasms.  11/18/11  Yes Historical Provider, MD  dexlansoprazole (DEXILANT) 60 MG capsule Take 60 mg by mouth every morning. 03/22/11  Yes Mahala Menghini, PA-C  dicyclomine (BENTYL) 20 MG tablet Take 1 tablet by mouth daily as needed for spasms.  04/24/13  Yes Historical Provider, MD  docusate sodium (COLACE) 100 MG capsule Take 100 mg by mouth 2 (two) times daily.   Yes Historical Provider, MD  enoxaparin (LOVENOX) 60 MG/0.6ML injection Inject 60 mg into the skin every evening.    Yes Historical Provider, MD  ENSURE PLUS (ENSURE PLUS) LIQD Take 237 mLs by mouth daily. Every other day   Yes Historical Provider, MD  ferrous [id number] C-folic acid (FEROCON) capsule Take 1 capsule by mouth daily.   Yes Historical Provider, MD  ferrous sulfate 325 (65 FE) MG tablet Take 325 mg by mouth daily with breakfast.   Yes Historical Provider, MD  folic acid (FOLVITE) 1 MG tablet Take 1 mg by mouth every morning.    Yes Historical Provider, MD  lidocaine-prilocaine (EMLA) cream Apply 1 application topically as needed.  03/29/14  Yes Molli Hazard, MD  LORazepam (ATIVAN) 1 MG tablet Take 1 mg by mouth every 4 (four) hours as needed. anxiety   Yes Historical Provider, MD  Melatonin 10 MG CAPS Take 10 mg by mouth at bedtime.   Yes Historical Provider, MD  ondansetron (ZOFRAN) 4 MG tablet Take 1 tablet (4 mg total) by mouth every 8 (eight) hours as needed for nausea. 02/22/14  Yes Jamesetta So, MD  oxyCODONE (OXY IR/ROXICODONE) 5 MG immediate release tablet Take 1 tablet by mouth. 03/22/14  Yes Historical Provider, MD  polyethylene glycol powder (GLYCOLAX/MIRALAX) powder Take 17 g by mouth as needed. For constipation   Yes Historical Provider, MD  Probiotic Product (PROBIOTIC DAILY) CAPS Take 1 capsule by mouth daily.   Yes Historical Provider, MD  prochlorperazine (COMPAZINE) 10 MG tablet Take 10 mg by mouth every 6 (six) hours as needed. Nausea and Vomiting   Yes Historical Provider, MD  psyllium (REGULOID) 0.52 G capsule Take 5 capsules by mouth 2 (two) times daily.    Yes Historical Provider, MD  sertraline (ZOLOFT) 100 MG tablet Take 150 mg by mouth at bedtime.    Yes Historical Provider, MD  sucralfate (CARAFATE) 1 G tablet Take 1 g by mouth 4 (four) times daily.   Yes Historical Provider, MD  sucralfate (CARAFATE) 1 GM/10ML suspension Take 10 mLs (1 g total) by mouth 4 (four) times daily. As needed 05/01/13  Yes Mahala Menghini, PA-C  thiamine 100 MG tablet Take 100 mg by mouth every morning.    Yes Historical Provider, MD  traMADol (ULTRAM) 50 MG tablet Take 50 mg by mouth every 6 (six) hours as needed for moderate pain or severe pain.    Yes Historical Provider, MD  valsartan-hydrochlorothiazide (DIOVAN-HCT) 80-12.5 MG per tablet Take 1 tablet by mouth every morning.    Yes Historical Provider, MD  Vitamin D, Ergocalciferol, (DRISDOL) 50000 UNITS CAPS Take 50,000 Units by mouth once a week. Takes on Sunday 03/13/12  Yes Historical Provider, MD  Wheat Dextrin (BENEFIBER PO) Take 1 each by mouth daily as  needed (to regulate bowel movements/for supplement).    Yes Historical Provider, MD  Multiple Vitamins-Minerals (MULTIVITAMINS THER. W/MINERALS) TABS tablet Take 1 tablet by mouth daily.    Historical Provider, MD  simvastatin (ZOCOR) 10 MG tablet Take 10 mg by mouth at bedtime.     Historical Provider, MD    Allergies as of 05/24/2014  . (No Known Allergies)    Family History  Problem Relation Age of Onset  . Hypertension Mother   . Arthritis Mother   . Pneumonia Mother   . Hypertension Father   . Heart attack Father     History   Social History  . Marital Status: Married    Spouse Name: N/A  . Number of Children: 3  . Years of Education: N/A   Occupational History  .  [employer]   Social History Main Topics  . Smoking status: Former Smoker -- 0.50 packs/day for 40 years    Types: Cigarettes    Quit date: 12/20/2012  . Smokeless tobacco: Never Used     Comment: Quit x 1 year  . Alcohol Use: No     Comment: last drink was Apr 16 2010  . Drug Use: No  . Sexual Activity: No   Other Topics Concern  . Not on file   Social History Narrative    Review of Systems: See HPI, otherwise negative ROS  Physical Exam: BP 145/85 mmHg  Pulse 86  Temp(Src) 97 F (36.1 C)  Ht 5\' 9"  (1.753 m)  Wt 144 lb 6.4 oz (65.499 kg)  BMI 21.31 kg/m2 General:   Alert,  pleasant and cooperative in NAD Skin:  Intact without significant lesions or rashes. Eyes:  Sclera clear, no icterus.   Conjunctiva pink. Ears:  Normal auditory acuity. Nose:  No deformity, discharge,  or lesions. Mouth:  No deformity or lesions. Neck:  Supple; no masses or thyromegaly. No significant cervical adenopathy. Lungs:  Clear throughout to auscultation.   No wheezes, crackles, or rhonchi. No acute distress. Heart:  Regular rate and rhythm; no murmurs, clicks, rubs,  or gallops. Abdomen: Non-distended  Recent vertical laparotomy scar healing well., normal bowel sounds.  Soft and nontender without  appreciable mass or hepatosplenomegaly.  Pulses:  Normal pulses noted. Extremities:  Without clubbing or edema.  Impression:   64 year old gentleman history stage III colon cancer, recent laparotomy for small bowel obstruction, GERD and recurrent esophageal dysphagia in the setting of known cervical esophageal web. Patient complains  of altered bowel function with features of both constipation and diarrhea. He may not now need MiraLax. Prior nice response to esophageal dilation. He is on chronic anticoagulation therapy because of current pulmonary emboli.  f Recommendations:   Schedule an EGD with possible ED - dysphagia - afternoon procedure no sooner than 2:30 pm.  No change in lovenox regimen. Afternoon scheduling will allow [place] to leave him on his regular Lovenox regimen  May have clear liquids until 11 AM the day of procedure  Stop Miralax; Continue stool softener   Begin Benefiber 2 teaspoons twice daily   Further recommendations to follow  Time spent with patient 25 minutes     Notice: This dictation was prepared with Dragon dictation along with smaller phrase technology. Any transcriptional errors that result from this process are unintentional and may not be corrected upon review.

## 2014-06-03 ENCOUNTER — Encounter (HOSPITAL_COMMUNITY)
Admission: RE | Disposition: A | Discharge status: Home / Self Care | Payer: Self-pay | Source: Ambulatory Visit | Attending: Internal Medicine

## 2014-06-03 ENCOUNTER — Encounter (HOSPITAL_COMMUNITY): Payer: Self-pay | Admitting: *Deleted

## 2014-06-03 ENCOUNTER — Ambulatory Visit (HOSPITAL_COMMUNITY)
Admission: RE | Admit: 2014-06-03 | Discharge: 2014-06-03 | Disposition: A | Discharge status: Home / Self Care | Payer: Medicare Other | Source: Ambulatory Visit | Attending: Internal Medicine | Admitting: Internal Medicine

## 2014-06-03 DIAGNOSIS — Q394 Esophageal web: Secondary | ICD-10-CM

## 2014-06-03 DIAGNOSIS — Z87891 Personal history of nicotine dependence: Secondary | ICD-10-CM | POA: Insufficient documentation

## 2014-06-03 DIAGNOSIS — E538 Deficiency of other specified B group vitamins: Secondary | ICD-10-CM | POA: Insufficient documentation

## 2014-06-03 DIAGNOSIS — Z7901 Long term (current) use of anticoagulants: Secondary | ICD-10-CM | POA: Diagnosis not present

## 2014-06-03 DIAGNOSIS — J189 Pneumonia, unspecified organism: Secondary | ICD-10-CM | POA: Diagnosis not present

## 2014-06-03 DIAGNOSIS — G4730 Sleep apnea, unspecified: Secondary | ICD-10-CM | POA: Insufficient documentation

## 2014-06-03 DIAGNOSIS — Z85038 Personal history of other malignant neoplasm of large intestine: Secondary | ICD-10-CM | POA: Diagnosis not present

## 2014-06-03 DIAGNOSIS — Z8673 Personal history of transient ischemic attack (TIA), and cerebral infarction without residual deficits: Secondary | ICD-10-CM | POA: Insufficient documentation

## 2014-06-03 DIAGNOSIS — Z9049 Acquired absence of other specified parts of digestive tract: Secondary | ICD-10-CM | POA: Insufficient documentation

## 2014-06-03 DIAGNOSIS — K210 Gastro-esophageal reflux disease with esophagitis: Secondary | ICD-10-CM | POA: Diagnosis not present

## 2014-06-03 DIAGNOSIS — K2289 Other specified disease of esophagus: Secondary | ICD-10-CM | POA: Insufficient documentation

## 2014-06-03 DIAGNOSIS — M810 Age-related osteoporosis without current pathological fracture: Secondary | ICD-10-CM | POA: Insufficient documentation

## 2014-06-03 DIAGNOSIS — I10 Essential (primary) hypertension: Secondary | ICD-10-CM | POA: Diagnosis not present

## 2014-06-03 DIAGNOSIS — K5900 Constipation, unspecified: Secondary | ICD-10-CM | POA: Insufficient documentation

## 2014-06-03 DIAGNOSIS — R1314 Dysphagia, pharyngoesophageal phase: Secondary | ICD-10-CM | POA: Insufficient documentation

## 2014-06-03 DIAGNOSIS — K229 Disease of esophagus, unspecified: Secondary | ICD-10-CM | POA: Insufficient documentation

## 2014-06-03 DIAGNOSIS — R1310 Dysphagia, unspecified: Secondary | ICD-10-CM | POA: Diagnosis present

## 2014-06-03 DIAGNOSIS — K2270 Barrett's esophagus without dysplasia: Secondary | ICD-10-CM | POA: Diagnosis not present

## 2014-06-03 DIAGNOSIS — F419 Anxiety disorder, unspecified: Secondary | ICD-10-CM | POA: Insufficient documentation

## 2014-06-03 DIAGNOSIS — K449 Diaphragmatic hernia without obstruction or gangrene: Secondary | ICD-10-CM | POA: Insufficient documentation

## 2014-06-03 DIAGNOSIS — Z86711 Personal history of pulmonary embolism: Secondary | ICD-10-CM | POA: Insufficient documentation

## 2014-06-03 DIAGNOSIS — Z9861 Coronary angioplasty status: Secondary | ICD-10-CM | POA: Diagnosis not present

## 2014-06-03 DIAGNOSIS — D649 Anemia, unspecified: Secondary | ICD-10-CM | POA: Diagnosis not present

## 2014-06-03 HISTORY — PX: SAVORY DILATION: SHX5439

## 2014-06-03 HISTORY — PX: MALONEY DILATION: SHX5535

## 2014-06-03 HISTORY — PX: ESOPHAGOGASTRODUODENOSCOPY: SHX5428

## 2014-06-03 SURGERY — EGD (ESOPHAGOGASTRODUODENOSCOPY)
Anesthesia: Moderate Sedation

## 2014-06-03 MED ORDER — MIDAZOLAM HCL 5 MG/5ML IJ SOLN
INTRAMUSCULAR | Status: AC
  Filled 2014-06-03: qty 10

## 2014-06-03 MED ORDER — LIDOCAINE VISCOUS 2 % MT SOLN
OROMUCOSAL | Status: DC
Start: 2014-06-03 — End: 2014-06-03
  Filled 2014-06-03: qty 15

## 2014-06-03 MED ORDER — STERILE WATER FOR IRRIGATION IR SOLN
Status: DC | PRN
  Administered 2014-06-03: 14:00:00

## 2014-06-03 MED ORDER — ONDANSETRON HCL 4 MG/2ML IJ SOLN
INTRAMUSCULAR | Status: AC
  Filled 2014-06-03: qty 2

## 2014-06-03 MED ORDER — SODIUM CHLORIDE 0.9 % IV SOLN
INTRAVENOUS | Status: DC
  Administered 2014-06-03: 14:00:00 via INTRAVENOUS

## 2014-06-03 MED ORDER — MEPERIDINE HCL 100 MG/ML IJ SOLN
INTRAMUSCULAR | Status: AC
  Filled 2014-06-03: qty 2

## 2014-06-03 MED ORDER — ONDANSETRON HCL 4 MG/2ML IJ SOLN
INTRAMUSCULAR | Status: DC | PRN
  Administered 2014-06-03: 4 mg via INTRAVENOUS

## 2014-06-03 MED ORDER — LIDOCAINE VISCOUS 2 % MT SOLN
OROMUCOSAL | Status: DC | PRN
Start: 2014-06-03 — End: 2014-06-03
  Administered 2014-06-03: 3 mL via OROMUCOSAL

## 2014-06-03 MED ORDER — MIDAZOLAM HCL 5 MG/5ML IJ SOLN
INTRAMUSCULAR | Status: DC | PRN
  Administered 2014-06-03: 2 mg via INTRAVENOUS
  Administered 2014-06-03 (×2): 1 mg via INTRAVENOUS
  Administered 2014-06-03: 2 mg via INTRAVENOUS

## 2014-06-03 MED ORDER — MEPERIDINE HCL 100 MG/ML IJ SOLN
INTRAMUSCULAR | Status: DC | PRN
  Administered 2014-06-03 (×2): 50 mg via INTRAVENOUS
  Administered 2014-06-03: 25 mg via INTRAVENOUS

## 2014-06-03 NOTE — H&P (View-Only) (Signed)
Primary Care Physician:  Netta Cedars, MD Primary Gastroenterologist:  Dr. [name]  Pre-Procedure History & Physical: HPI:  [name] is a 64 y.o. male here for  Follow-up of "constipation".  History stage III colon cancer status post left hemi-colectomy. Hospitalized here in [place] last fall and down at [facility] for small bowel obstruction. He spent almost the entire month of December 2015 in the hospital. He underwent laparotomy. No small bowel had to be resected as patient reports. He had a tough time with his "bowels waking up". Has gained 4 pounds since he was last seen here last year. GERD symptoms well controlled on Dexilant 60 mg daily. Has recurrent esophageal dysphagia (history of a cervical esophageal web dilated with marked improvement in symptoms back in 2013). 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  Has not had any melena or rectal bleeding.  States he's been on MiraLax and stool softener daily. Passes Bristol 1 stools and "liquid". Sometimes perceives difficulty evacuating at other times complaints of diarrhea. No fiber supplementation. MiraLax started empirically at the time of discharge from [facility].  Adenoma removed from his colon at the anastomosis in 2014; he will be due for surveillance colonoscopy 2017.  Past Medical History  Diagnosis Date  . Pulmonary embolism 02/2010  . Hypertension   . Osteoporosis   . Arthritis   . TIA (transient ischemic attack) 10/11  . ETOH abuse     quit 03/2010  . 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 Personal history of PE (pulmonary embolism) 10/01/2010  . Blood transfusion   . S/P endoscopy September 28, 2010    erosive reflux esophagitis, Billroth I anatomy  . Shortness of breath   . Sleep apnea   . Recurrent upper respiratory infection (URI)   . Anxiety   . Pneumonia   . Anemia   . Ileus   . Chronic abdominal pain   . GERD (gastroesophageal reflux  disease)   . Chest pain at rest   . Erosive esophagitis   . Vitamin B12 deficiency   . Bowel obstruction 05/13/2012    Recurrent  . Hx of Clostridium difficile infection 01/2012  . Bronchitis   . 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 Obstruction of bowel 03/03/14    Past Surgical History  Procedure Laterality Date  . Hernia repair      right inguinal  . Appendectomy  1980s  . Cholecystectomy  1980s  . Portacath placement    . Abdominal sugery      for bowel obstruction x 8, all in 1980s, except for one in 07/2010  . Esophagogastroduodenoscopy  09/28/2010  . Esophagogastroduodenoscopy  12/01/2010    Cervical web status post dilation, erosive esophagitis, B1 hemigastrectomy, inflamed anastomosis  . Colonoscopy  03/18/2011    anastomosis at 35cm. Several adenomatous polyps removed. Sigmoid diverticulosis. Next TCS 02/2013  . Esophagogastroduodenoscopy  04/16/2011    excoriation at GEJ c/w trauma/M-W tear, friable gastric anastomosis, dilation efferent limb  . Billroth 1 hemigastrectomy  1980s    per patient for benign duodenal tumor  . Esophagogastroduodenoscopy (egd) with esophageal dilation  02/25/2012    PZW:CHENIDPO esophageal web-s/p dilation anddisruption as described above. Status post prior gastric with Billroth I configuration. Abnormal gastric mucosa at the anastomosis. Gastric biopsy showed mild chronic inflammation but no H. pylori   . Colonoscopy N/A 07/24/2012    EUM:PNTIRW post segmental resection  with normal-appearing colonic anastomosis aside from an adjacent polyp-removed as described above. Rectal polyp-removed as described above. CT findings appear to have been artifactual. tubular adenomas/prolapsed type polyp.  . Colon surgery  May 2012    left hemicolectomy, colon cancer found at time of surgery for bowel obstruction  . Cardiac catheterization  07/17/2012  . Abdominal adhesion surgery  03/04/15    @ [facility]    Prior to Admission  medications   Medication Sig Start Date End Date Taking? Authorizing Provider  albuterol (PROAIR HFA) 108 (90 BASE) MCG/ACT inhaler Inhale into the lungs every 6 (six) hours as needed for wheezing or shortness of breath.   Yes Historical Provider, MD  alendronate (FOSAMAX) 70 MG tablet Take 70 mg by mouth every 7 (seven) days. On sundays 10/14/11  Yes Historical Provider, MD  butalbital-acetaminophen-caffeine (FIORICET, ESGIC) 50-325-40 MG per tablet 1 tablet 3 (three) times daily as needed for headache or migraine.  04/10/13  Yes Historical Provider, MD  cholecalciferol (VITAMIN D) 1000 UNITS tablet Take 1,000 Units by mouth every morning.   Yes Historical Provider, MD  cyanocobalamin (,VITAMIN B-12,) 1000 MCG/ML injection Inject 1,000 mcg into the muscle every 30 (thirty) days.   Yes Historical Provider, MD  cyclobenzaprine (FLEXERIL) 10 MG tablet Take 10 mg by mouth daily as needed for muscle spasms.  11/18/11  Yes Historical Provider, MD  dexlansoprazole (DEXILANT) 60 MG capsule Take 60 mg by mouth every morning. 03/22/11  Yes Mahala Menghini, PA-C  dicyclomine (BENTYL) 20 MG tablet Take 1 tablet by mouth daily as needed for spasms.  04/24/13  Yes Historical Provider, MD  docusate sodium (COLACE) 100 MG capsule Take 100 mg by mouth 2 (two) times daily.   Yes Historical Provider, MD  enoxaparin (LOVENOX) 60 MG/0.6ML injection Inject 60 mg into the skin every evening.    Yes Historical Provider, MD  ENSURE PLUS (ENSURE PLUS) LIQD Take 237 mLs by mouth daily. Every other day   Yes Historical Provider, MD  ferrous [id number] C-folic acid (FEROCON) capsule Take 1 capsule by mouth daily.   Yes Historical Provider, MD  ferrous sulfate 325 (65 FE) MG tablet Take 325 mg by mouth daily with breakfast.   Yes Historical Provider, MD  folic acid (FOLVITE) 1 MG tablet Take 1 mg by mouth every morning.    Yes Historical Provider, MD  lidocaine-prilocaine (EMLA) cream Apply 1 application topically as needed.  03/29/14  Yes Molli Hazard, MD  LORazepam (ATIVAN) 1 MG tablet Take 1 mg by mouth every 4 (four) hours as needed. anxiety   Yes Historical Provider, MD  Melatonin 10 MG CAPS Take 10 mg by mouth at bedtime.   Yes Historical Provider, MD  ondansetron (ZOFRAN) 4 MG tablet Take 1 tablet (4 mg total) by mouth every 8 (eight) hours as needed for nausea. 02/22/14  Yes Jamesetta So, MD  oxyCODONE (OXY IR/ROXICODONE) 5 MG immediate release tablet Take 1 tablet by mouth. 03/22/14  Yes Historical Provider, MD  polyethylene glycol powder (GLYCOLAX/MIRALAX) powder Take 17 g by mouth as needed. For constipation   Yes Historical Provider, MD  Probiotic Product (PROBIOTIC DAILY) CAPS Take 1 capsule by mouth daily.   Yes Historical Provider, MD  prochlorperazine (COMPAZINE) 10 MG tablet Take 10 mg by mouth every 6 (six) hours as needed. Nausea and Vomiting   Yes Historical Provider, MD  psyllium (REGULOID) 0.52 G capsule Take 5 capsules by mouth 2 (two) times daily.    Yes Historical Provider, MD  sertraline (ZOLOFT) 100 MG tablet Take 150 mg by mouth at bedtime.    Yes Historical Provider, MD  sucralfate (CARAFATE) 1 G tablet Take 1 g by mouth 4 (four) times daily.   Yes Historical Provider, MD  sucralfate (CARAFATE) 1 GM/10ML suspension Take 10 mLs (1 g total) by mouth 4 (four) times daily. As needed 05/01/13  Yes Mahala Menghini, PA-C  thiamine 100 MG tablet Take 100 mg by mouth every morning.    Yes Historical Provider, MD  traMADol (ULTRAM) 50 MG tablet Take 50 mg by mouth every 6 (six) hours as needed for moderate pain or severe pain.    Yes Historical Provider, MD  valsartan-hydrochlorothiazide (DIOVAN-HCT) 80-12.5 MG per tablet Take 1 tablet by mouth every morning.    Yes Historical Provider, MD  Vitamin D, Ergocalciferol, (DRISDOL) 50000 UNITS CAPS Take 50,000 Units by mouth once a week. Takes on Sunday 03/13/12  Yes Historical Provider, MD  Wheat Dextrin (BENEFIBER PO) Take 1 each by mouth daily as  needed (to regulate bowel movements/for supplement).    Yes Historical Provider, MD  Multiple Vitamins-Minerals (MULTIVITAMINS THER. W/MINERALS) TABS tablet Take 1 tablet by mouth daily.    Historical Provider, MD  simvastatin (ZOCOR) 10 MG tablet Take 10 mg by mouth at bedtime.     Historical Provider, MD    Allergies as of 05/24/2014  . (No Known Allergies)    Family History  Problem Relation Age of Onset  . Hypertension Mother   . Arthritis Mother   . Pneumonia Mother   . Hypertension Father   . Heart attack Father     History   Social History  . Marital Status: Married    Spouse Name: N/A  . Number of Children: 3  . Years of Education: N/A   Occupational History  .  [employer]   Social History Main Topics  . Smoking status: Former Smoker -- 0.50 packs/day for 40 years    Types: Cigarettes    Quit date: 12/20/2012  . Smokeless tobacco: Never Used     Comment: Quit x 1 year  . Alcohol Use: No     Comment: last drink was Apr 16 2010  . Drug Use: No  . Sexual Activity: No   Other Topics Concern  . Not on file   Social History Narrative    Review of Systems: See HPI, otherwise negative ROS  Physical Exam: BP 145/85 mmHg  Pulse 86  Temp(Src) 97 F (36.1 C)  Ht 5\' 9"  (1.753 m)  Wt 144 lb 6.4 oz (65.499 kg)  BMI 21.31 kg/m2 General:   Alert,  pleasant and cooperative in NAD Skin:  Intact without significant lesions or rashes. Eyes:  Sclera clear, no icterus.   Conjunctiva pink. Ears:  Normal auditory acuity. Nose:  No deformity, discharge,  or lesions. Mouth:  No deformity or lesions. Neck:  Supple; no masses or thyromegaly. No significant cervical adenopathy. Lungs:  Clear throughout to auscultation.   No wheezes, crackles, or rhonchi. No acute distress. Heart:  Regular rate and rhythm; no murmurs, clicks, rubs,  or gallops. Abdomen: Non-distended  Recent vertical laparotomy scar healing well., normal bowel sounds.  Soft and nontender without  appreciable mass or hepatosplenomegaly.  Pulses:  Normal pulses noted. Extremities:  Without clubbing or edema.  Impression:   64 year old gentleman history stage III colon cancer, recent laparotomy for small bowel obstruction, GERD and recurrent esophageal dysphagia in the setting of known cervical esophageal web. Patient complains  of altered bowel function with features of both constipation and diarrhea. He may not now need MiraLax. Prior nice response to esophageal dilation. He is on chronic anticoagulation therapy because of current pulmonary emboli.  f Recommendations:   Schedule an EGD with possible ED - dysphagia - afternoon procedure no sooner than 2:30 pm.  No change in lovenox regimen. Afternoon scheduling will allow [place] to leave him on his regular Lovenox regimen  May have clear liquids until 11 AM the day of procedure  Stop Miralax; Continue stool softener   Begin Benefiber 2 teaspoons twice daily   Further recommendations to follow  Time spent with patient 25 minutes     Notice: This dictation was prepared with Dragon dictation along with smaller phrase technology. Any transcriptional errors that result from this process are unintentional and may not be corrected upon review.

## 2014-06-03 NOTE — Discharge Instructions (Signed)
EGD Discharge instructions Please read the instructions outlined below and refer to this sheet in the next few weeks. These discharge instructions provide you with general information on caring for yourself after you leave the hospital. Your doctor may also give you specific instructions. While your treatment has been planned according to the most current medical practices available, unavoidable complications occasionally occur. If you have any problems or questions after discharge, please call your doctor. ACTIVITY  You may resume your regular activity but move at a slower pace for the next 24 hours.   Take frequent rest periods for the next 24 hours.   Walking will help expel (get rid of) the air and reduce the bloated feeling in your abdomen.   No driving for 24 hours (because of the anesthesia (medicine) used during the test).   You may shower.   Do not sign any important legal documents or operate any machinery for 24 hours (because of the anesthesia used during the test).  NUTRITION  Drink plenty of fluids.   You may resume your normal diet.   Begin with a light meal and progress to your normal diet.   Avoid alcoholic beverages for 24 hours or as instructed by your caregiver.  MEDICATIONS  You may resume your normal medications unless your caregiver tells you otherwise.  WHAT YOU CAN EXPECT TODAY  You may experience abdominal discomfort such as a feeling of fullness or gas pains.  FOLLOW-UP  Your doctor will discuss the results of your test with you.  SEEK IMMEDIATE MEDICAL ATTENTION IF ANY OF THE FOLLOWING OCCUR:  Excessive nausea (feeling sick to your stomach) and/or vomiting.   Severe abdominal pain and distention (swelling).   Trouble swallowing.   Temperature over 101 F (37.8 C).   Rectal bleeding or vomiting of blood.    GERD information provided  Continue Dexilant 60 mg daily  Make sure you drink plenty of liquids when swallowing oral  medications  Continue Lovenox today according to your regular routine  Further recommendations to follow pending review of pathology report    Gastroesophageal Reflux Disease, Adult Gastroesophageal reflux disease (GERD) happens when acid from your stomach flows up into the esophagus. When acid comes in contact with the esophagus, the acid causes soreness (inflammation) in the esophagus. Over time, GERD may create small holes (ulcers) in the lining of the esophagus. CAUSES  Increased body weight. This puts pressure on the stomach, making acid rise from the stomach into the esophagus. Smoking. This increases acid production in the stomach. Drinking alcohol. This causes decreased pressure in the lower esophageal sphincter (valve or ring of muscle between the esophagus and stomach), allowing acid from the stomach into the esophagus. Late evening meals and a full stomach. This increases pressure and acid production in the stomach. A malformed lower esophageal sphincter. Sometimes, no cause is found. SYMPTOMS  Burning pain in the lower part of the mid-chest behind the breastbone and in the mid-stomach area. This may occur twice a week or more often. Trouble swallowing. Sore throat. Dry cough. Asthma-like symptoms including chest tightness, shortness of breath, or wheezing. DIAGNOSIS  Your caregiver may be able to diagnose GERD based on your symptoms. In some cases, X-rays and other tests may be done to check for complications or to check the condition of your stomach and esophagus. TREATMENT  Your caregiver may recommend over-the-counter or prescription medicines to help decrease acid production. Ask your caregiver before starting or adding any new medicines.  HOME CARE INSTRUCTIONS  Change the factors that you can control. Ask your caregiver for guidance concerning weight loss, quitting smoking, and alcohol consumption. Avoid foods and drinks that make your symptoms worse, such as: Caffeine  or alcoholic drinks. Chocolate. Peppermint or mint flavorings. Garlic and onions. Spicy foods. Citrus fruits, such as oranges, lemons, or limes. Tomato-based foods such as sauce, chili, salsa, and pizza. Fried and fatty foods. Avoid lying down for the 3 hours prior to your bedtime or prior to taking a nap. Eat small, frequent meals instead of large meals. Wear loose-fitting clothing. Do not wear anything tight around your waist that causes pressure on your stomach. Raise the head of your bed 6 to 8 inches with wood blocks to help you sleep. Extra pillows will not help. Only take over-the-counter or prescription medicines for pain, discomfort, or fever as directed by your caregiver. Do not take aspirin, ibuprofen, or other nonsteroidal anti-inflammatory drugs (NSAIDs). SEEK IMMEDIATE MEDICAL CARE IF:  You have pain in your arms, neck, jaw, teeth, or back. Your pain increases or changes in intensity or duration. You develop nausea, vomiting, or sweating (diaphoresis). You develop shortness of breath, or you faint. Your vomit is green, yellow, black, or looks like coffee grounds or blood. Your stool is red, bloody, or black. These symptoms could be signs of other problems, such as heart disease, gastric bleeding, or esophageal bleeding. MAKE SURE YOU:  Understand these instructions. Will watch your condition. Will get help right away if you are not doing well or get worse. Document Released: 12/16/2004 Document Revised: 05/31/2011 Document Reviewed: 09/25/2010 [facility] Patient Information 2015 [place], [place]. This information is not intended to replace advice given to you by your health care provider. Make sure you discuss any questions you have with your health care provider.

## 2014-06-03 NOTE — Interval H&P Note (Signed)
History and Physical Interval Note:  06/03/2014 2:16 PM  [name]  has presented today for surgery, with the diagnosis of DYSPHAGIA  The various methods of treatment have been discussed with the patient and family. After consideration of risks, benefits and other options for treatment, the patient has consented to  Procedure(s) with comments: ESOPHAGOGASTRODUODENOSCOPY (EGD) (N/A) - 2:45 SAVORY DILATION (N/A) MALONEY DILATION (N/A) as a surgical intervention .  The patient's history has been reviewed, patient examined, no change in status, stable for surgery.  I have reviewed the patient's chart and labs.  Questions were answered to the patient's satisfaction.     [name]   No change. EGD with dilation as appropriate per plan.The risks, benefits, limitations, alternatives and imponderables have been reviewed with the patient. Potential for esophageal dilation, biopsy, etc. have also been reviewed.  Questions have been answered. All parties agreeable.

## 2014-06-03 NOTE — Op Note (Signed)
[facility] [address] [place], [address]   ENDOSCOPY PROCEDURE REPORT  PATIENT: [name], [name]  MR#: [id number] BIRTHDATE: Feb 02, 1951 , 48  yrs. old GENDER: male ENDOSCOPIST: R.  Garfield Cornea, MD FACP [facility] REFERRED BY:  [name], MD PROCEDURE DATE:  06/08/14 PROCEDURE:  EGD w/ biopsy and Maloney dilation of esophagus INDICATIONS:  Recurrent esophageal dysphagia; known cervical esophageal with. MEDICATIONS: Versed 6 mg IV and Demerol 125 mg IV in divided doses. Xylocaine gel orally.  Zofran 4 mg IV. ASA CLASS:      Class III  CONSENT: The risks, benefits, limitations, alternatives and imponderables have been discussed.  The potential for biopsy, esophogeal dilation, etc. have also been reviewed.  Questions have been answered.  All parties agreeable.  Please see the history and physical in the medical record for more information.  DESCRIPTION OF PROCEDURE: After the risks benefits and alternatives of the procedure were thoroughly explained, informed consent was obtained.  The EG-[id number] ([id number]) endoscope was introduced through the mouth and advanced to the second portion of the duodenum , limited by Without limitations. The instrument was slowly withdrawn as the mucosa was fully examined.    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  I was able to get through with mild resistance, passing the adult gastroscope.  Single "Tongue" of salmon-colored epithelium with neovascular changes distal esophagus coming up about 2 cm into the distal esophagus suspicious for Barrett's esophagus.  The tubular esophagus was otherwise patent.  There was no nodularity.  No esophagitis.  Stomach surgically altered.  Small hiatal hernia.  Residual gastric mucosa appeared normal.  One efferent This efferent intubated a good 15 cm.  This segment of the GI tract also appear normal. Retroflexed views revealed as  previously described.     The scope was withdrawn. A 56 French Maloney dilator was passed a full insertion with mild resistance.  A look back revealed the web had been nicely dilated without apparent complication. The scope was then withdrawn from the patient and the procedure completed.  COMPLICATIONS: There were no immediate complications.  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 Status post prior gastric surgery with Billroth I configuration  RECOMMENDATIONS: Continue Dexilant 60 mg daily; Continue swallowing precautions. Follow-up on pathology.  Continue Lovenox today per regular routine  REPEAT EXAM:  eSigned:  R. Garfield Cornea, MD [facility]al[facility] 06/08/2014 2:58 PM    CC:  CPT CODES: ICD CODES:  The ICD and CPT codes recommended by this software are interpretations from the data that the clinical staff has captured with the software.  The verification of the translation of this report to the ICD and CPT codes and modifiers is the sole responsibility of the health care institution and practicing physician where this report was generated.  [employer] will not be held responsible for the validity of the ICD and CPT codes included on this report.  AMA assumes no liability for data contained or not contained herein. CPT is a Designer, television/film set of the [employer].  PATIENT NAME:  [name], [name] MR#: [id number]

## 2014-06-04 ENCOUNTER — Encounter (HOSPITAL_COMMUNITY): Payer: Self-pay | Admitting: Internal Medicine

## 2014-06-10 ENCOUNTER — Encounter: Payer: Self-pay | Admitting: Internal Medicine

## 2014-06-10 ENCOUNTER — Other Ambulatory Visit: Payer: Self-pay | Admitting: Gastroenterology

## 2014-06-11 ENCOUNTER — Other Ambulatory Visit: Payer: Self-pay

## 2014-06-11 MED ORDER — DEXLANSOPRAZOLE 60 MG PO CPDR: 60.0000 mg | DELAYED_RELEASE_CAPSULE | Freq: Every morning | ORAL | Status: DC

## 2014-06-21 ENCOUNTER — Encounter (HOSPITAL_COMMUNITY): Payer: Federal, State, Local not specified - PPO | Attending: Hematology and Oncology

## 2014-06-21 DIAGNOSIS — Z452 Encounter for adjustment and management of vascular access device: Secondary | ICD-10-CM

## 2014-06-21 DIAGNOSIS — Z85038 Personal history of other malignant neoplasm of large intestine: Secondary | ICD-10-CM | POA: Diagnosis not present

## 2014-06-21 DIAGNOSIS — C189 Malignant neoplasm of colon, unspecified: Secondary | ICD-10-CM | POA: Insufficient documentation

## 2014-06-21 MED ORDER — SODIUM CHLORIDE 0.9 % IJ SOLN
10.0000 mL | INTRAMUSCULAR | Status: DC | PRN
  Administered 2014-06-21: 10 mL via INTRAVENOUS
  Filled 2014-06-21: qty 10

## 2014-06-21 MED ORDER — HEPARIN SOD (PORK) LOCK FLUSH 100 UNIT/ML IV SOLN
500.0000 [IU] | Freq: Once | INTRAVENOUS | Status: AC
  Administered 2014-06-21: 500 [IU] via INTRAVENOUS

## 2014-06-21 MED ORDER — HEPARIN SOD (PORK) LOCK FLUSH 100 UNIT/ML IV SOLN
INTRAVENOUS | Status: AC
  Filled 2014-06-21: qty 5

## 2014-06-21 NOTE — Progress Notes (Signed)
[name] presented for Portacath access and flush.  Proper placement of portacath confirmed by CXR.  Portacath located left chest wall accessed with  H 20 needle.  Good blood return present. Portacath flushed with 55ml NS and 500U/88ml Heparin and needle removed intact.  Procedure tolerated well and without incident.

## 2014-08-02 ENCOUNTER — Encounter (HOSPITAL_COMMUNITY): Payer: Medicare Other | Attending: Hematology and Oncology

## 2014-08-02 DIAGNOSIS — Z95828 Presence of other vascular implants and grafts: Secondary | ICD-10-CM

## 2014-08-02 DIAGNOSIS — Z85038 Personal history of other malignant neoplasm of large intestine: Secondary | ICD-10-CM | POA: Diagnosis not present

## 2014-08-02 DIAGNOSIS — Z452 Encounter for adjustment and management of vascular access device: Secondary | ICD-10-CM

## 2014-08-02 DIAGNOSIS — C189 Malignant neoplasm of colon, unspecified: Secondary | ICD-10-CM | POA: Insufficient documentation

## 2014-08-02 MED ORDER — SODIUM CHLORIDE 0.9 % IJ SOLN
10.0000 mL | Freq: Once | INTRAMUSCULAR | Status: AC
  Administered 2014-08-02: 10 mL via INTRAVENOUS

## 2014-08-02 MED ORDER — HEPARIN SOD (PORK) LOCK FLUSH 100 UNIT/ML IV SOLN
500.0000 [IU] | Freq: Once | INTRAVENOUS | Status: AC
  Administered 2014-08-02: 500 [IU] via INTRAVENOUS

## 2014-08-02 NOTE — Progress Notes (Signed)
[name] presented for Portacath access and flush. Proper placement of portacath confirmed by CXR. Portacath located left chest wall accessed with  H 20 needle. Good blood return present. Portacath flushed with 20ml NS and 500U/5ml Heparin and needle removed intact. Procedure without incident. Patient tolerated procedure well.   

## 2014-08-02 NOTE — Patient Instructions (Signed)
[place] at [facility] Discharge Instructions  RECOMMENDATIONS MADE BY THE CONSULTANT AND ANY TEST RESULTS WILL BE SENT TO YOUR REFERRING PHYSICIAN.  Port flush today. Continue port flushes every 6-8 weeks. Return as scheduled.  Thank you for choosing [place] at [facility] to provide your oncology and hematology care.  To afford each patient quality time with our provider, please arrive at least 15 minutes before your scheduled appointment time.    You need to re-schedule your appointment should you arrive 10 or more minutes late.  We strive to give you quality time with our providers, and arriving late affects you and other patients whose appointments are after yours.  Also, if you no show three or more times for appointments you may be dismissed from the clinic at the providers discretion.     Again, thank you for choosing [facility].  Our hope is that these requests will decrease the amount of time that you wait before being seen by our physicians.       _____________________________________________________________  Should you have questions after your visit to [facility], please contact our office at [phone] between the hours of 8:30 a.m. and 4:30 p.m.  Voicemails left after 4:30 p.m. will not be returned until the following business day.  For prescription refill requests, have your pharmacy contact our office.

## 2014-08-15 ENCOUNTER — Emergency Department (HOSPITAL_COMMUNITY)
Admission: EM | Admit: 2014-08-15 | Discharge: 2014-08-16 | Disposition: A | Discharge status: Home / Self Care | Payer: Medicare Other | Attending: Emergency Medicine | Admitting: Emergency Medicine

## 2014-08-15 ENCOUNTER — Encounter (HOSPITAL_COMMUNITY): Payer: Self-pay

## 2014-08-15 ENCOUNTER — Emergency Department (HOSPITAL_COMMUNITY): Payer: Medicare Other

## 2014-08-15 DIAGNOSIS — R1012 Left upper quadrant pain: Secondary | ICD-10-CM | POA: Diagnosis not present

## 2014-08-15 DIAGNOSIS — Z8701 Personal history of pneumonia (recurrent): Secondary | ICD-10-CM | POA: Diagnosis not present

## 2014-08-15 DIAGNOSIS — E538 Deficiency of other specified B group vitamins: Secondary | ICD-10-CM | POA: Insufficient documentation

## 2014-08-15 DIAGNOSIS — Z79899 Other long term (current) drug therapy: Secondary | ICD-10-CM | POA: Insufficient documentation

## 2014-08-15 DIAGNOSIS — G8929 Other chronic pain: Secondary | ICD-10-CM | POA: Diagnosis not present

## 2014-08-15 DIAGNOSIS — M810 Age-related osteoporosis without current pathological fracture: Secondary | ICD-10-CM | POA: Insufficient documentation

## 2014-08-15 DIAGNOSIS — K5900 Constipation, unspecified: Secondary | ICD-10-CM | POA: Diagnosis not present

## 2014-08-15 DIAGNOSIS — I10 Essential (primary) hypertension: Secondary | ICD-10-CM | POA: Insufficient documentation

## 2014-08-15 DIAGNOSIS — Z87891 Personal history of nicotine dependence: Secondary | ICD-10-CM | POA: Diagnosis not present

## 2014-08-15 DIAGNOSIS — Z86711 Personal history of pulmonary embolism: Secondary | ICD-10-CM | POA: Insufficient documentation

## 2014-08-15 DIAGNOSIS — Z8673 Personal history of transient ischemic attack (TIA), and cerebral infarction without residual deficits: Secondary | ICD-10-CM | POA: Diagnosis not present

## 2014-08-15 DIAGNOSIS — D649 Anemia, unspecified: Secondary | ICD-10-CM | POA: Insufficient documentation

## 2014-08-15 DIAGNOSIS — K219 Gastro-esophageal reflux disease without esophagitis: Secondary | ICD-10-CM | POA: Diagnosis not present

## 2014-08-15 DIAGNOSIS — F419 Anxiety disorder, unspecified: Secondary | ICD-10-CM | POA: Diagnosis not present

## 2014-08-15 DIAGNOSIS — Z8709 Personal history of other diseases of the respiratory system: Secondary | ICD-10-CM | POA: Insufficient documentation

## 2014-08-15 DIAGNOSIS — Z86018 Personal history of other benign neoplasm: Secondary | ICD-10-CM | POA: Insufficient documentation

## 2014-08-15 DIAGNOSIS — R112 Nausea with vomiting, unspecified: Secondary | ICD-10-CM | POA: Diagnosis present

## 2014-08-15 LAB — BASIC METABOLIC PANEL
Anion gap: 8 (ref 5–15)
BUN: 14 mg/dL (ref 6–20)
CO2: 24 mmol/L (ref 22–32)
Calcium: 8.4 mg/dL — ABNORMAL LOW (ref 8.9–10.3)
Chloride: 107 mmol/L (ref 101–111)
Creatinine, Ser: 1.23 mg/dL (ref 0.61–1.24)
Glucose, Bld: 93 mg/dL (ref 65–99)
Potassium: 4 mmol/L (ref 3.5–5.1)
SODIUM: 139 mmol/L (ref 135–145)

## 2014-08-15 LAB — URINALYSIS, ROUTINE W REFLEX MICROSCOPIC
BILIRUBIN URINE: NEGATIVE
Glucose, UA: NEGATIVE mg/dL
Hgb urine dipstick: NEGATIVE
Leukocytes, UA: NEGATIVE
Nitrite: NEGATIVE
PH: 5.5 (ref 5.0–8.0)
PROTEIN: NEGATIVE mg/dL
Specific Gravity, Urine: >1.03 — ABNORMAL HIGH (ref 1.005–1.030)
Urobilinogen, UA: 0.2 mg/dL (ref 0.0–1.0)

## 2014-08-15 LAB — CBC WITH DIFFERENTIAL/PLATELET
BASOS ABS: 0 10*3/uL (ref 0.0–0.1)
Basophils Relative: 0 % (ref 0–1)
EOS PCT: 1 % (ref 0–5)
Eosinophils Absolute: 0.1 10*3/uL (ref 0.0–0.7)
HCT: 36.2 % — ABNORMAL LOW (ref 39.0–52.0)
HEMOGLOBIN: 11.8 g/dL — AB (ref 13.0–17.0)
Lymphocytes Relative: 26 % (ref 12–46)
Lymphs Abs: 2.1 10*3/uL (ref 0.7–4.0)
MCH: 29 pg (ref 26.0–34.0)
MCHC: 32.6 g/dL (ref 30.0–36.0)
MCV: 88.9 fL (ref 78.0–100.0)
MONO ABS: 0.6 10*3/uL (ref 0.1–1.0)
Monocytes Relative: 8 % (ref 3–12)
NEUTROS ABS: 5.2 10*3/uL (ref 1.7–7.7)
Neutrophils Relative %: 65 % (ref 43–77)
Platelets: 144 10*3/uL — ABNORMAL LOW (ref 150–400)
RBC: 4.07 MIL/uL — ABNORMAL LOW (ref 4.22–5.81)
RDW: 14.7 % (ref 11.5–15.5)
WBC: 8.1 10*3/uL (ref 4.0–10.5)

## 2014-08-15 MED ORDER — MORPHINE SULFATE 4 MG/ML IJ SOLN
4.0000 mg | Freq: Once | INTRAMUSCULAR | Status: AC
  Administered 2014-08-15: 4 mg via INTRAVENOUS
  Filled 2014-08-15: qty 1

## 2014-08-15 MED ORDER — SODIUM CHLORIDE 0.9 % IV BOLUS (SEPSIS)
500.0000 mL | Freq: Once | INTRAVENOUS | Status: AC
  Administered 2014-08-15: 500 mL via INTRAVENOUS

## 2014-08-15 MED ORDER — ONDANSETRON HCL 4 MG/2ML IJ SOLN
4.0000 mg | Freq: Once | INTRAMUSCULAR | Status: AC
  Administered 2014-08-15: 4 mg via INTRAVENOUS
  Filled 2014-08-15: qty 2

## 2014-08-15 MED ORDER — MORPHINE SULFATE 4 MG/ML IJ SOLN
4.0000 mg | Freq: Once | INTRAMUSCULAR | Status: AC
Start: 2014-08-15 — End: 2014-08-15
  Administered 2014-08-15: 4 mg via INTRAVENOUS
  Filled 2014-08-15: qty 1

## 2014-08-15 MED ORDER — ONDANSETRON HCL 4 MG/2ML IJ SOLN
4.0000 mg | Freq: Once | INTRAMUSCULAR | Status: AC
Start: 2014-08-15 — End: 2014-08-15
  Administered 2014-08-15: 4 mg via INTRAVENOUS
  Filled 2014-08-15: qty 2

## 2014-08-15 NOTE — ED Notes (Signed)
Pt c/o abd pain with n/v/d since this morning.

## 2014-08-15 NOTE — ED Provider Notes (Signed)
CSN: [id number]     Arrival date & time 08/15/14  1605 History   First MD Initiated Contact with Patient 08/15/14 1833     Chief Complaint  Patient presents with  . Abdominal Pain  . Emesis   Patient is a 64 y.o. male presenting with abdominal pain and vomiting. The history is provided by the patient.  Abdominal Pain Pain location:  LUQ Pain quality: aching   Pain radiates to:  Does not radiate Pain severity:  Moderate Onset quality:  Gradual Duration:  1 day Timing:  Constant Progression:  Worsening Chronicity:  Recurrent Relieved by:  Nothing Worsened by:  Movement and palpation Associated symptoms: constipation, nausea and vomiting   Associated symptoms: no chest pain, no diarrhea, no dysuria and no fever   Emesis Associated symptoms: abdominal pain   Associated symptoms: no diarrhea     Past Medical History  Diagnosis Date  . Pulmonary embolism 02/2010  . Hypertension   . Osteoporosis   . Arthritis   . TIA (transient ischemic attack) 10/11  . ETOH abuse     quit 03/2010  . 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 Personal history of PE (pulmonary embolism) 10/01/2010  . Blood transfusion   . S/P endoscopy September 28, 2010    erosive reflux esophagitis, Billroth I anatomy  . Shortness of breath   . Sleep apnea   . Recurrent upper respiratory infection (URI)   . Anxiety   . Pneumonia   . Anemia   . Ileus   . Chronic abdominal pain   . GERD (gastroesophageal reflux disease)   . Chest pain at rest   . Erosive esophagitis   . Vitamin B12 deficiency   . Bowel obstruction 05/13/2012    Recurrent  . Hx of Clostridium difficile infection 01/2012  . Bronchitis   . 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 Obstruction of bowel 03/03/14   Past Surgical History  Procedure Laterality Date  . Hernia repair      right inguinal  . Appendectomy  1980s  . Cholecystectomy  1980s  . Portacath placement    . Abdominal sugery      for bowel obstruction x  8, all in 1980s, except for one in 07/2010  . Esophagogastroduodenoscopy  09/28/2010  . Esophagogastroduodenoscopy  12/01/2010    Cervical web status post dilation, erosive esophagitis, B1 hemigastrectomy, inflamed anastomosis  . Colonoscopy  03/18/2011    anastomosis at 35cm. Several adenomatous polyps removed. Sigmoid diverticulosis. Next TCS 02/2013  . Esophagogastroduodenoscopy  04/16/2011    excoriation at [facility] c/w trauma/M-W tear, friable gastric anastomosis, dilation efferent limb  . Billroth 1 hemigastrectomy  1980s    per patient for benign duodenal tumor  . Esophagogastroduodenoscopy (egd) with esophageal dilation  02/25/2012    SJG:GEZMOQHU esophageal web-s/p dilation anddisruption as described above. Status post prior gastric with Billroth I configuration. Abnormal gastric mucosa at the anastomosis. Gastric biopsy showed mild chronic inflammation but no H. pylori   . Colonoscopy N/A 07/24/2012    TML:YYTKPT post segmental resection with normal-appearing colonic anastomosis aside from an adjacent polyp-removed as described above. Rectal polyp-removed as described above. CT findings appear to have been artifactual. tubular adenomas/prolapsed type polyp.  . Colon surgery  May 2012    left hemicolectomy, colon cancer found at time of surgery for bowel obstruction  . Cardiac catheterization  07/17/2012  . Abdominal adhesion surgery  03/04/15    @ [facility]  . Esophagogastroduodenoscopy N/A  06/03/2014    Procedure: ESOPHAGOGASTRODUODENOSCOPY (EGD);  Surgeon: [name], MD;  [facility];  Service: Endoscopy;  Laterality: N/A;  2:45  . Savory dilation N/A 06/03/2014    Procedure: SAVORY DILATION;  Surgeon: [name], MD;  Location: [facility];  Service: Endoscopy;  Laterality: N/A;  [name] dilation N/A 06/03/2014    Procedure: [name] DILATION;  Surgeon: [name], MD;  Location: [facility];  Service: Endoscopy;  Laterality: N/A;   Family History  Problem Relation Age  of Onset  . Hypertension Mother   . Arthritis Mother   . Pneumonia Mother   . Hypertension Father   . Heart attack Father    History  Substance Use Topics  . Smoking status: Former Smoker -- 0.50 packs/day for 40 years    Types: Cigarettes    Quit date: 12/20/2012  . Smokeless tobacco: Never Used     Comment: Quit x 1 year  . Alcohol Use: No     Comment: last drink was Apr 16 2010    Review of Systems  Constitutional: Negative for fever.  Cardiovascular: Negative for chest pain.  Gastrointestinal: Positive for nausea, vomiting, abdominal pain and constipation. Negative for diarrhea.  Genitourinary: Negative for dysuria and testicular pain.  All other systems reviewed and are negative.     Allergies  Review of patient's allergies indicates no known allergies.  Home Medications   Prior to Admission medications   Medication Sig Start Date End Date Taking? Authorizing Provider  cholecalciferol (VITAMIN D) 1000 UNITS tablet Take 1,000 Units by mouth every morning.   Yes Historical Provider, MD  cyclobenzaprine (FLEXERIL) 10 MG tablet Take 10 mg by mouth daily as needed for muscle spasms.  11/18/11  Yes Historical Provider, MD  dexlansoprazole (DEXILANT) 60 MG capsule Take 1 capsule (60 mg total) by mouth every morning. 06/11/14  Yes Orvil Feil, NP  docusate sodium (COLACE) 100 MG capsule Take 100 mg by mouth 2 (two) times daily.   Yes Historical Provider, MD  enoxaparin (LOVENOX) 60 MG/0.6ML injection Inject 60 mg into the skin every evening.    Yes Historical Provider, MD  ENSURE PLUS (ENSURE PLUS) LIQD Take 237 mLs by mouth daily. Every other day   Yes Historical Provider, MD  ferrous [id number] C-folic acid (FEROCON) capsule Take 1 capsule by mouth daily.   Yes Historical Provider, MD  ferrous sulfate 325 (65 FE) MG tablet Take 325 mg by mouth daily with breakfast.   Yes Historical Provider, MD  folic acid (FOLVITE) 1 MG tablet Take 1 mg by mouth every morning.     Yes Historical Provider, MD  lidocaine-prilocaine (EMLA) cream Apply 1 application topically as needed. 03/29/14  Yes Patrici Ranks, MD  LORazepam (ATIVAN) 1 MG tablet Take 1 mg by mouth every 4 (four) hours as needed. anxiety   Yes Historical Provider, MD  Melatonin 10 MG CAPS Take 10 mg by mouth at bedtime.   Yes Historical Provider, MD  Multiple Vitamins-Minerals (MULTIVITAMINS THER. W/MINERALS) TABS tablet Take 1 tablet by mouth daily.   Yes Historical Provider, MD  Probiotic Product (PROBIOTIC DAILY) CAPS Take 1 capsule by mouth daily.   Yes Historical Provider, MD  psyllium (REGULOID) 0.52 G capsule Take 5 capsules by mouth 2 (two) times daily.    Yes Historical Provider, MD  sertraline (ZOLOFT) 100 MG tablet Take 150 mg by mouth at bedtime.    Yes Historical Provider, MD  simvastatin (ZOCOR) 10 MG  tablet Take 10 mg by mouth at bedtime.    Yes Historical Provider, MD  sucralfate (CARAFATE) 1 G tablet Take 1 g by mouth 4 (four) times daily.   Yes Historical Provider, MD  sucralfate (CARAFATE) 1 GM/10ML suspension Take 10 mLs (1 g total) by mouth 4 (four) times daily. As needed 05/01/13  Yes Mahala Menghini, PA-C  thiamine 100 MG tablet Take 100 mg by mouth every morning.    Yes Historical Provider, MD  traMADol (ULTRAM) 50 MG tablet Take 50 mg by mouth every 6 (six) hours as needed for moderate pain or severe pain.    Yes Historical Provider, MD  valsartan-hydrochlorothiazide (DIOVAN-HCT) 80-12.5 MG per tablet Take 1 tablet by mouth every morning.    Yes Historical Provider, MD  Wheat Dextrin (BENEFIBER PO) Take 1 each by mouth daily as needed (to regulate bowel movements/for supplement).    Yes Historical Provider, MD  albuterol (PROAIR HFA) 108 (90 BASE) MCG/ACT inhaler Inhale into the lungs every 6 (six) hours as needed for wheezing or shortness of breath.    Historical Provider, MD  alendronate (FOSAMAX) 70 MG tablet Take 70 mg by mouth every 7 (seven) days. On sundays 10/14/11   Historical  Provider, MD  cyanocobalamin (,VITAMIN B-12,) 1000 MCG/ML injection Inject 1,000 mcg into the muscle every 30 (thirty) days.    Historical Provider, MD  ondansetron (ZOFRAN) 4 MG tablet Take 1 tablet (4 mg total) by mouth every 8 (eight) hours as needed for nausea. 02/22/14   Aviva Signs Md, MD  polyethylene glycol powder [facility]) powder Take 17 g by mouth as needed. For constipation    Historical Provider, MD  prochlorperazine (COMPAZINE) 10 MG tablet Take 10 mg by mouth every 6 (six) hours as needed. Nausea and Vomiting    Historical Provider, MD  Vitamin D, Ergocalciferol, (DRISDOL) 50000 UNITS CAPS Take 50,000 Units by mouth once a week. Takes on Sunday 03/13/12   Historical Provider, MD   BP 136/86 mmHg  Pulse 80  Temp(Src) 97.9 F (36.6 C) (Oral)  Resp 18  Ht '5\' 9"'$  (1.753 m)  Wt 135 lb (61.236 kg)  BMI 19.93 kg/m2  SpO2 99% Physical Exam CONSTITUTIONAL: Well developed/well nourished HEAD: Normocephalic/atraumatic EYES: EOMI/PERRL ENMT: Mucous membranes moist NECK: supple no meningeal signs SPINE/BACK:entire spine nontender CV: S1/S2 noted, no murmurs/rubs/gallops noted LUNGS: Lungs are clear to auscultation bilaterally, no apparent distress ABDOMEN: soft, moderate LUQ tenderness, no rebound or guarding, bowel sounds noted throughout abdomen.  Healed surgical scars noted to abdomen GU:no cva tenderness NEURO: Pt is awake/alert/appropriate, moves all extremitiesx4.  No facial droop.   EXTREMITIES: pulses normal/equal, full ROM SKIN: warm, color normal PSYCH: no abnormalities of mood noted, alert and oriented to situation  ED Course  Procedures   Medications  morphine 4 MG/ML injection 4 mg (4 mg Intravenous Given 08/15/14 1937)  ondansetron (ZOFRAN) injection 4 mg (4 mg Intravenous Given 08/15/14 1938)  sodium chloride 0.9 % bolus 500 mL (0 mLs Intravenous Stopped 08/15/14 2037)  morphine 4 MG/ML injection 4 mg (4 mg Intravenous Given 08/15/14 2109)  ondansetron  (ZOFRAN) injection 4 mg (4 mg Intravenous Given 08/15/14 2109)    Labs Review Labs Reviewed  CBC WITH DIFFERENTIAL/PLATELET - Abnormal; Notable for the following:    RBC 4.07 (*)    Hemoglobin 11.8 (*)    HCT 36.2 (*)    Platelets 144 (*)    All other components within normal limits  URINALYSIS, ROUTINE W REFLEX MICROSCOPIC (NOT AT  [facility]) - Abnormal; Notable for the following:    Specific Gravity, Urine >1.030 (*)    Ketones, ur TRACE (*)    All other components within normal limits  BASIC METABOLIC PANEL    Imaging Review Dg Abd Acute W/chest  08/15/2014   CLINICAL DATA:  Diffuse abdominal pain, nausea, vomiting and diarrhea since this morning. History of colon cancer, bowel obstruction, partial gastrectomy, appendectomy, cholecystectomy and colon resection.  EXAM: DG ABDOMEN ACUTE W/ 1V CHEST  COMPARISON:  Previous examinations.  FINDINGS: Normal sized heart. Clear lungs. The lungs remain mildly hyperexpanded with mild central peribronchial thickening. Left subclavian porta catheter tip in the proximal superior vena cava. Minimal linear scarring at the right lung base.  Multiple abdominal surgical clips and staples. No dilated bowel loops or free peritoneal air. Diffuse osteopenia. Lower lumbar spine and lower thoracic spine degenerative changes.  IMPRESSION: No acute abnormality.   Electronically Signed   By: Claudie Revering M.D.   On: 08/15/2014 20:13     EKG Interpretation   Date/Time:  Thursday Aug 15 2014 20:09:16 EDT Ventricular Rate:  77 PR Interval:  133 QRS Duration: 88 QT Interval:  371 QTC Calculation: 420 R Axis:   26 Text Interpretation:  Sinus rhythm Normal ECG No significant change since  last tracing Confirmed by Christy Gentles  MD, Donta Mcinroy ([id number]) on 08/15/2014  8:12:18 PM     10:21 PM Pt improved Initial workup negative He has complex abdominal surgery history with recent SBO due to adhesions.  He thought he had another SBO tonight.  Clinically he does not have SBO -  he is not vomiting, he has only mild tenderness in LUQ.  We discussed option of further testing/admission vs. Going home - he would prefer to go home as he is improved without vomiting.  We discussed strict return precautions He reports chronic daily abd pain at 2/10, but today it is up to 6/10 Will need monitored until 1am due to pain meds and he is driving  Signed out to dr cook to d/c patient at 1am  MDM   Final diagnoses:  Abdominal pain, left upper quadrant    Nursing notes including past medical history and social history reviewed and considered in documentation xrays/imaging reviewed by myself and considered during evaluation Labs/vital reviewed myself and considered during evaluation Previous records reviewed and considered     Ripley Fraise, MD 08/15/14 2224

## 2014-08-15 NOTE — ED Notes (Signed)
Pt is driving and has been given IV pain meds. Per Dr. Christy Gentles, pt can not leave until 1am.

## 2014-08-15 NOTE — ED Notes (Signed)
Pt given a sprite

## 2014-08-16 DIAGNOSIS — R1012 Left upper quadrant pain: Secondary | ICD-10-CM | POA: Diagnosis not present

## 2014-08-16 MED ORDER — HEPARIN SOD (PORK) LOCK FLUSH 100 UNIT/ML IV SOLN
INTRAVENOUS | Status: AC
  Administered 2014-08-16: 500 [IU]
  Filled 2014-08-16: qty 5

## 2014-08-16 NOTE — Discharge Instructions (Signed)
°  SEEK IMMEDIATE MEDICAL ATTENTION IF: The pain does not go away or becomes severe, particularly over the next 8-12 hours.  A temperature above 100.45F develops.  Repeated vomiting occurs (multiple episodes).  The pain becomes localized to portions of the abdomen. Blood is being passed in stools or vomit (bright red or black tarry stools).  Return also if you develop chest pain, difficulty breathing, dizziness or fainting, or become confused, poorly responsive, or inconsolable.  Abdominal Pain Many things can cause abdominal pain. Usually, abdominal pain is not caused by a disease and will improve without treatment. It can often be observed and treated at home. Your health care provider will do a physical exam and possibly order blood tests and X-rays to help determine the seriousness of your pain. However, in many cases, more time must pass before a clear cause of the pain can be found. Before that point, your health care provider may not know if you need more testing or further treatment. HOME CARE INSTRUCTIONS  Monitor your abdominal pain for any changes. The following actions may help to alleviate any discomfort you are experiencing:  Only take over-the-counter or prescription medicines as directed by your health care provider.  Do not take laxatives unless directed to do so by your health care provider.  Try a clear liquid diet (broth, tea, or water) as directed by your health care provider. Slowly move to a bland diet as tolerated. SEEK MEDICAL CARE IF:  You have unexplained abdominal pain.  You have abdominal pain associated with nausea or diarrhea.  You have pain when you urinate or have a bowel movement.  You experience abdominal pain that wakes you in the night.  You have abdominal pain that is worsened or improved by eating food.  You have abdominal pain that is worsened with eating fatty foods.  You have a fever. SEEK IMMEDIATE MEDICAL CARE IF:   Your pain does not go  away within 2 hours.  You keep throwing up (vomiting).  Your pain is felt only in portions of the abdomen, such as the right side or the left lower portion of the abdomen.  You pass bloody or black tarry stools. MAKE SURE YOU:  Understand these instructions.   Will watch your condition.   Will get help right away if you are not doing well or get worse.  Document Released: 12/16/2004 Document Revised: 03/13/2013 Document Reviewed: 11/15/2012 [facility] Patient Information 2015 [place], [place]. This information is not intended to replace advice given to you by your health care provider. Make sure you discuss any questions you have with your health care provider.

## 2014-09-13 ENCOUNTER — Encounter (HOSPITAL_COMMUNITY): Payer: Self-pay | Admitting: Oncology

## 2014-09-13 ENCOUNTER — Encounter (HOSPITAL_COMMUNITY): Payer: Medicare Other | Attending: Hematology and Oncology | Admitting: Oncology

## 2014-09-13 ENCOUNTER — Ambulatory Visit (HOSPITAL_COMMUNITY): Payer: Federal, State, Local not specified - PPO | Admitting: Hematology & Oncology

## 2014-09-13 ENCOUNTER — Other Ambulatory Visit (HOSPITAL_COMMUNITY): Payer: Federal, State, Local not specified - PPO

## 2014-09-13 ENCOUNTER — Encounter (HOSPITAL_COMMUNITY): Payer: Medicare Other

## 2014-09-13 VITALS — BP 127/76 | HR 104 | Temp 98.1°F | Resp 16 | Wt 143.2 lb

## 2014-09-13 DIAGNOSIS — Z9889 Other specified postprocedural states: Secondary | ICD-10-CM | POA: Diagnosis present

## 2014-09-13 DIAGNOSIS — C189 Malignant neoplasm of colon, unspecified: Secondary | ICD-10-CM | POA: Diagnosis present

## 2014-09-13 DIAGNOSIS — Z95828 Presence of other vascular implants and grafts: Secondary | ICD-10-CM

## 2014-09-13 LAB — COMPREHENSIVE METABOLIC PANEL
ALT: 16 U/L — AB (ref 17–63)
AST: 18 U/L (ref 15–41)
Albumin: 3.8 g/dL (ref 3.5–5.0)
Alkaline Phosphatase: 93 U/L (ref 38–126)
Anion gap: 8 (ref 5–15)
BUN: 10 mg/dL (ref 6–20)
CO2: 24 mmol/L (ref 22–32)
Calcium: 8.5 mg/dL — ABNORMAL LOW (ref 8.9–10.3)
Chloride: 110 mmol/L (ref 101–111)
Creatinine, Ser: 1.14 mg/dL (ref 0.61–1.24)
GLUCOSE: 91 mg/dL (ref 65–99)
POTASSIUM: 4.4 mmol/L (ref 3.5–5.1)
SODIUM: 142 mmol/L (ref 135–145)
TOTAL PROTEIN: 6.6 g/dL (ref 6.5–8.1)
Total Bilirubin: 0.4 mg/dL (ref 0.3–1.2)

## 2014-09-13 LAB — CBC WITH DIFFERENTIAL/PLATELET
BASOS ABS: 0 10*3/uL (ref 0.0–0.1)
BASOS PCT: 0 % (ref 0–1)
EOS ABS: 0.1 10*3/uL (ref 0.0–0.7)
EOS PCT: 1 % (ref 0–5)
HCT: 40.6 % (ref 39.0–52.0)
Hemoglobin: 13.4 g/dL (ref 13.0–17.0)
LYMPHS PCT: 19 % (ref 12–46)
Lymphs Abs: 2.2 10*3/uL (ref 0.7–4.0)
MCH: 29.5 pg (ref 26.0–34.0)
MCHC: 33 g/dL (ref 30.0–36.0)
MCV: 89.2 fL (ref 78.0–100.0)
Monocytes Absolute: 1.1 10*3/uL — ABNORMAL HIGH (ref 0.1–1.0)
Monocytes Relative: 9 % (ref 3–12)
Neutro Abs: 8.1 10*3/uL — ABNORMAL HIGH (ref 1.7–7.7)
Neutrophils Relative %: 71 % (ref 43–77)
PLATELETS: 194 10*3/uL (ref 150–400)
RBC: 4.55 MIL/uL (ref 4.22–5.81)
RDW: 15 % (ref 11.5–15.5)
WBC: 11.5 10*3/uL — AB (ref 4.0–10.5)

## 2014-09-13 MED ORDER — HEPARIN SOD (PORK) LOCK FLUSH 100 UNIT/ML IV SOLN
INTRAVENOUS | Status: AC
  Filled 2014-09-13: qty 5

## 2014-09-13 MED ORDER — HEPARIN SOD (PORK) LOCK FLUSH 100 UNIT/ML IV SOLN
500.0000 [IU] | Freq: Once | INTRAVENOUS | Status: AC
  Administered 2014-09-13: 500 [IU] via INTRAVENOUS

## 2014-09-13 MED ORDER — SODIUM CHLORIDE 0.9 % IJ SOLN
10.0000 mL | INTRAMUSCULAR | Status: DC | PRN
  Administered 2014-09-13: 10 mL via INTRAVENOUS
  Filled 2014-09-13: qty 10

## 2014-09-13 NOTE — Progress Notes (Signed)
[name] presented for Portacath access and flush.  Proper placement of portacath confirmed by CXR.  Portacath located left chest wall accessed with  H 20 needle.  Good blood return present. Portacath flushed with 20ml NS and 500U/5ml Heparin and needle removed intact.  Procedure tolerated well and without incident.    

## 2014-09-13 NOTE — Progress Notes (Signed)
Please see doctors encounter for more information 

## 2014-09-13 NOTE — Assessment & Plan Note (Addendum)
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  Labs today: CBC diff, CMET, CEA  Return in 6 months with repeat labs: CBC diff, CMET  Next CT abd/pelvis is due in November 2016 (based upon his last imaging).  Order is placed and will be scheduled.   Return in 6 months for follow-up.

## 2014-09-13 NOTE — Patient Instructions (Signed)
[place] at [facility] Discharge Instructions  RECOMMENDATIONS MADE BY THE CONSULTANT AND ANY TEST RESULTS WILL BE SENT TO YOUR REFERRING PHYSICIAN.  Exam completed by Kirby Crigler today Port flushed today and lab work completed Return to see the doctor in 6 months and lab work CT of abd/pelvis  Please call the clinic if you have any questions or concerns  Thank you for choosing [place] at [facility] to provide your oncology and hematology care.  To afford each patient quality time with our provider, please arrive at least 15 minutes before your scheduled appointment time.    You need to re-schedule your appointment should you arrive 10 or more minutes late.  We strive to give you quality time with our providers, and arriving late affects you and other patients whose appointments are after yours.  Also, if you no show three or more times for appointments you may be dismissed from the clinic at the providers discretion.     Again, thank you for choosing [facility].  Our hope is that these requests will decrease the amount of time that you wait before being seen by our physicians.       _____________________________________________________________  Should you have questions after your visit to [facility], please contact our office at [phone] between the hours of 8:30 a.m. and 4:30 p.m.  Voicemails left after 4:30 p.m. will not be returned until the following business day.  For prescription refill requests, have your pharmacy contact our office.

## 2014-09-13 NOTE — Progress Notes (Signed)
[name], [place] # [place] [address]  Port catheter in place - Plan: Schedule Portacath Flush Appointment, heparin lock flush 100 unit/mL, sodium chloride 0.9 % injection 10 mL  Colon cancer - Plan: CBC with Differential, Comprehensive metabolic panel, CEA, CBC with Differential, Comprehensive metabolic panel, CEA, CT Abdomen Pelvis W Contrast  Adenocarcinoma of colon with mucinous features  CURRENT THERAPY: Surveillance per NCCN guidelines  INTERVAL HISTORY: [name] 64 y.o. 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 AND He has a hisory of PE in November 2011 and December 2012. He is on lovenox. He was deemed a coumadin failure after his second PE.    Chart is reviewed.   I personally reviewed and went over laboratory results with the patient.  The results are noted within this dictation.  They will be updated today.  He is doing well.  He notes an episode of an ileus earlier this week.  His last SBO was in December 2015 he notes.    I personally reviewed and went over radiographic studies with the patient.  The results are noted within this dictation. No evidence of malignancy on CT abd/pelvis in November 2015.   He reports that he feels well without any complaints today.   Past Medical History  Diagnosis Date  . Pulmonary embolism 02/2010  . Hypertension   . Osteoporosis   . Arthritis   . TIA (transient ischemic attack) 10/11  . ETOH abuse     quit 03/2010  . 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 Personal history of PE (pulmonary embolism) 10/01/2010  . Blood transfusion   . S/P endoscopy September 28, 2010    erosive reflux esophagitis, Billroth I anatomy  . Shortness of breath   . Sleep apnea   . Recurrent upper respiratory infection (URI)   . Anxiety   . Pneumonia   . Anemia   . Ileus   . Chronic abdominal  pain   . GERD (gastroesophageal reflux disease)   . Chest pain at rest   . Erosive esophagitis   . Vitamin B12 deficiency   . Bowel obstruction 05/13/2012    Recurrent  . Hx of Clostridium difficile infection 01/2012  . Bronchitis   . 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 Obstruction of bowel 03/03/14    has Adenocarcinoma of colon with mucinous features; Bleeding gastrointestinal; Supratherapeutic INR; Abdominal pain, other specified site; Nausea and vomiting in adult; Anemia, chronic disease; Fever chills; S/P partial gastrectomy; Personal history of PE (pulmonary embolism); Bronchitis, acute; Erosive esophagitis; Esophageal reflux disease; Coffee ground emesis; Hematemesis; Chronic abdominal pain; Pancytopenia due to antineoplastic chemotherapy; Pneumonia; Pulmonary embolism; Chest pain at rest; Small bowel obstruction; Left sided abdominal pain; GI bleed; Coagulopathy; Gastroenteritis; Partial small bowel obstruction; Nausea; Abdominal distension; HTN (hypertension); Diarrhea; Cellulitis; Esophageal dysphagia; H/O Clostridium difficile infection; Rectal bleeding; Bowel obstruction; Abnormal CT scan, colon; SBO (small bowel obstruction); Dysphagia, pharyngoesophageal phase; and Mucosal abnormality of esophagus on his problem list.     has No Known Allergies.  Current Outpatient Prescriptions on File Prior to Visit  Medication Sig Dispense Refill  . albuterol (PROAIR HFA) 108 (90 BASE) MCG/ACT inhaler Inhale into the lungs every 6 (six) hours as needed for wheezing or shortness of breath.    [name] alendronate (  FOSAMAX) 70 MG tablet Take 70 mg by mouth every 7 (seven) days. On sundays    . cholecalciferol (VITAMIN D) 1000 UNITS tablet Take 1,000 Units by mouth every morning.    . cyanocobalamin (,VITAMIN B-12,) 1000 MCG/ML injection Inject 1,000 mcg into the muscle every 30 (thirty) days.    . cyclobenzaprine (FLEXERIL) 10 MG tablet Take 10 mg by mouth daily as  needed for muscle spasms.     [name] dexlansoprazole (DEXILANT) 60 MG capsule Take 1 capsule (60 mg total) by mouth every morning. 30 capsule 5  . docusate sodium (COLACE) 100 MG capsule Take 100 mg by mouth 2 (two) times daily.    [name] enoxaparin (LOVENOX) 60 MG/0.6ML injection Inject 60 mg into the skin every evening.     [name] ENSURE PLUS (ENSURE PLUS) LIQD Take 237 mLs by mouth daily. Every other day    . ferrous [id number] C-folic acid (FEROCON) capsule Take 1 capsule by mouth daily.    . ferrous sulfate 325 (65 FE) MG tablet Take 325 mg by mouth daily with breakfast.    . folic acid (FOLVITE) 1 MG tablet Take 1 mg by mouth every morning.     . lidocaine-prilocaine (EMLA) cream Apply 1 application topically as needed. 30 g 3  . LORazepam (ATIVAN) 1 MG tablet Take 1 mg by mouth every 4 (four) hours as needed. anxiety    . Melatonin 10 MG CAPS Take 10 mg by mouth at bedtime.    . Multiple Vitamins-Minerals (MULTIVITAMINS THER. W/MINERALS) TABS tablet Take 1 tablet by mouth daily.    . ondansetron (ZOFRAN) 4 MG tablet Take 1 tablet (4 mg total) by mouth every 8 (eight) hours as needed for nausea. 20 tablet 1  . polyethylene glycol powder (GLYCOLAX/MIRALAX) powder Take 17 g by mouth as needed. For constipation    . Probiotic Product (PROBIOTIC DAILY) CAPS Take 1 capsule by mouth daily.    . prochlorperazine (COMPAZINE) 10 MG tablet Take 10 mg by mouth every 6 (six) hours as needed. Nausea and Vomiting    . sertraline (ZOLOFT) 100 MG tablet Take 150 mg by mouth at bedtime.     . simvastatin (ZOCOR) 10 MG tablet Take 10 mg by mouth at bedtime.     . sucralfate (CARAFATE) 1 G tablet Take 1 g by mouth 4 (four) times daily.    . sucralfate (CARAFATE) 1 GM/10ML suspension Take 10 mLs (1 g total) by mouth 4 (four) times daily. As needed 420 mL 1  . thiamine 100 MG tablet Take 100 mg by mouth every morning.     . traMADol (ULTRAM) 50 MG tablet Take 50 mg by mouth every 6 (six) hours as needed for  moderate pain or severe pain.     . valsartan-hydrochlorothiazide (DIOVAN-HCT) 80-12.5 MG per tablet Take 1 tablet by mouth every morning.     . Vitamin D, Ergocalciferol, (DRISDOL) 50000 UNITS CAPS Take 50,000 Units by mouth once a week. Takes on Sunday    . Wheat Dextrin (BENEFIBER PO) Take 1 each by mouth daily as needed (to regulate bowel movements/for supplement).     . psyllium (REGULOID) 0.52 G capsule Take 5 capsules by mouth 2 (two) times daily.      No current facility-administered medications on file prior to visit.    Past Surgical History  Procedure Laterality Date  . Hernia repair      right inguinal  . Appendectomy  1980s  . Cholecystectomy  1980s  . Portacath placement    .  Abdominal sugery      for bowel obstruction x 8, all in 1980s, except for one in 07/2010  . Esophagogastroduodenoscopy  09/28/2010  . Esophagogastroduodenoscopy  12/01/2010    Cervical web status post dilation, erosive esophagitis, B1 hemigastrectomy, inflamed anastomosis  . Colonoscopy  03/18/2011    anastomosis at 35cm. Several adenomatous polyps removed. Sigmoid diverticulosis. Next TCS 02/2013  . Esophagogastroduodenoscopy  04/16/2011    excoriation at GEJ c/w trauma/M-W tear, friable gastric anastomosis, dilation efferent limb  . Billroth 1 hemigastrectomy  1980s    per patient for benign duodenal tumor  . Esophagogastroduodenoscopy (egd) with esophageal dilation  02/25/2012    RDE:YCXKGYJE esophageal web-s/p dilation anddisruption as described above. Status post prior gastric with Billroth I configuration. Abnormal gastric mucosa at the anastomosis. Gastric biopsy showed mild chronic inflammation but no H. pylori   . Colonoscopy N/A 07/24/2012    HUD:JSHFWY post segmental resection with normal-appearing colonic anastomosis aside from an adjacent polyp-removed as described above. Rectal polyp-removed as described above. CT findings appear to have been artifactual. tubular adenomas/prolapsed type polyp.    . Colon surgery  May 2012    left hemicolectomy, colon cancer found at time of surgery for bowel obstruction  . Cardiac catheterization  07/17/2012  . Abdominal adhesion surgery  03/04/15    @ [facility]  . Esophagogastroduodenoscopy N/A 06/03/2014    Procedure: ESOPHAGOGASTRODUODENOSCOPY (EGD);  Surgeon: [name], MD;  Location: [facility];  Service: Endoscopy;  Laterality: N/A;  2:45  . Savory dilation N/A 06/03/2014    Procedure: SAVORY DILATION;  Surgeon: [name], MD;  Location: [facility];  Service: Endoscopy;  Laterality: N/A;  [name] dilation N/A 06/03/2014    Procedure: [name] DILATION;  Surgeon: [name], MD;  Location: [facility];  Service: Endoscopy;  Laterality: N/A;    Denies any headaches, dizziness, double vision, fevers, chills, night sweats, nausea, vomiting, diarrhea, constipation, chest pain, heart palpitations, shortness of breath, blood in stool, black tarry stool, urinary pain, urinary burning, urinary frequency, hematuria.   PHYSICAL EXAMINATION  ECOG PERFORMANCE STATUS: 1 - Symptomatic but completely ambulatory  Filed Vitals:   09/13/14 1147  BP: 127/76  Pulse: 104  Temp: 98.1 F (36.7 C)  Resp: 16    GENERAL:alert, no distress, well nourished, well developed, comfortable, cooperative and smiling SKIN: skin color, texture, turgor are normal, no rashes or significant lesions HEAD: Normocephalic, No masses, lesions, tenderness or abnormalities EYES: normal, PERRLA, EOMI, Conjunctiva are pink and non-injected EARS: External ears normal OROPHARYNX:lips, buccal mucosa, and tongue normal and mucous membranes are moist  NECK: supple, no adenopathy, thyroid normal size, non-tender, without nodularity, no stridor, non-tender, trachea midline LYMPH:  no palpable lymphadenopathy, no hepatosplenomegaly BREAST:not examined LUNGS: clear to auscultation and percussion HEART: regular rate & rhythm, no murmurs, no gallops, S1 normal and S2  normal ABDOMEN:abdomen soft, normal bowel sounds and no masses or organomegaly BACK: Back symmetric, no curvature. EXTREMITIES:less then 2 second capillary refill, no joint deformities, effusion, or inflammation, no edema, no skin discoloration, no clubbing, no cyanosis  NEURO: alert & oriented x 3 with fluent speech, no focal motor/sensory deficits, gait normal   LABORATORY DATA: CBC    Component Value Date/Time   WBC 8.1 08/15/2014 1930   RBC 4.07* 08/15/2014 1930   RBC 3.21* 12/07/2010 0516   HGB 11.8* 08/15/2014 1930   HCT 36.2* 08/15/2014 1930   PLT 144* 08/15/2014 1930   MCV 88.9 08/15/2014 1930   [place]  29.0 08/15/2014 1930   MCHC 32.6 08/15/2014 1930   RDW 14.7 08/15/2014 1930   LYMPHSABS 2.1 08/15/2014 1930   MONOABS 0.6 08/15/2014 1930   EOSABS 0.1 08/15/2014 1930   BASOSABS 0.0 08/15/2014 1930      Chemistry      Component Value Date/Time   NA 139 08/15/2014 1930   K 4.0 08/15/2014 1930   CL 107 08/15/2014 1930   CO2 24 08/15/2014 1930   BUN 14 08/15/2014 1930   CREATININE 1.23 08/15/2014 1930      Component Value Date/Time   CALCIUM 8.4* 08/15/2014 1930   ALKPHOS 73 02/18/2014 1825   AST 14 02/18/2014 1825   ALT 8 02/18/2014 1825   BILITOT 0.3 02/18/2014 1825      RADIOGRAPHIC STUDIES:  Dg Abd Acute W/chest  08/15/2014   CLINICAL DATA:  Diffuse abdominal pain, nausea, vomiting and diarrhea since this morning. History of colon cancer, bowel obstruction, partial gastrectomy, appendectomy, cholecystectomy and colon resection.  EXAM: DG ABDOMEN ACUTE W/ 1V CHEST  COMPARISON:  Previous examinations.  FINDINGS: Normal sized heart. Clear lungs. The lungs remain mildly hyperexpanded with mild central peribronchial thickening. Left subclavian porta catheter tip in the proximal superior vena cava. Minimal linear scarring at the right lung base.  Multiple abdominal surgical clips and staples. No dilated bowel loops or free peritoneal air. Diffuse osteopenia. Lower  lumbar spine and lower thoracic spine degenerative changes.  IMPRESSION: No acute abnormality.   Electronically Signed   By: Claudie Revering M.D.   On: 08/15/2014 20:13      ASSESSMENT AND PLAN:  Adenocarcinoma of colon with mucinous features Stage III colon cancer diagnosed in May of 2012 followed by FOLFOX for 7 of a planned 12 cycles ending on 01/04/2011 because of neuropathy.  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  Labs today: CBC diff, CMET, CEA  Return in 6 months with repeat labs: CBC diff, CMET  Next CT abd/pelvis is due in November 2016 (based upon his last imaging).  Order is placed and will be scheduled.   Return in 6 months for follow-up.    THERAPY PLAN:  NCCN guidelines for surveillance for T3/T4, N0-2, M0 colorectal cancer are:   A. H+P every 3-6 months x 2 years and then every 6 months for a total of 5 years   B. CEA every 3 months x 2 years and then every 6 months for a total of 5 years   C. CT abd/pelvis annually for up to 5 years   D. Colonoscopy in 1 year except if no preoperative colonoscopy due to obstructing lesion, colonoscopy in 3-6 months.    1. If advanced adenoma, repeat in 1 year   2. If no advanced adenoma, repeat in 3 years, then every 5 years  E. PET scan not routinely recommended.   All questions were answered. The patient knows to call the clinic with any problems, questions or concerns. We can certainly see the patient much sooner if necessary.  Patient and plan discussed with Dr. Ancil Linsey and she is in agreement with the aforementioned.   This note is electronically signed by: Doy Mince 09/13/2014 12:38 PM

## 2014-09-14 LAB — CEA: CEA: 5 ng/mL — ABNORMAL HIGH (ref 0.0–4.7)

## 2014-09-20 ENCOUNTER — Other Ambulatory Visit (HOSPITAL_COMMUNITY): Payer: Self-pay | Admitting: Oncology

## 2014-09-20 ENCOUNTER — Telehealth (HOSPITAL_COMMUNITY): Payer: Self-pay

## 2014-09-20 DIAGNOSIS — C189 Malignant neoplasm of colon, unspecified: Secondary | ICD-10-CM

## 2014-09-20 NOTE — Telephone Encounter (Signed)
-----   Message from Baird [facility], PA-C sent at 09/20/2014  4:39 PM EDT ----- Repeat CEA in 4 weeks.  Order signed.

## 2014-09-20 NOTE — Telephone Encounter (Signed)
Patient notified and appointment scheduled for 10/18/14.

## 2014-10-18 ENCOUNTER — Encounter (HOSPITAL_COMMUNITY): Payer: Medicare Other | Attending: Hematology & Oncology

## 2014-10-18 DIAGNOSIS — C189 Malignant neoplasm of colon, unspecified: Secondary | ICD-10-CM | POA: Insufficient documentation

## 2014-10-18 DIAGNOSIS — Z85038 Personal history of other malignant neoplasm of large intestine: Secondary | ICD-10-CM

## 2014-10-18 NOTE — Progress Notes (Signed)
LABS DRAWN

## 2014-10-19 LAB — CEA: CEA: 3.9 ng/mL (ref 0.0–4.7)

## 2014-10-25 ENCOUNTER — Encounter (HOSPITAL_COMMUNITY): Payer: Medicare Other

## 2014-11-08 ENCOUNTER — Encounter (HOSPITAL_COMMUNITY): Payer: Federal, State, Local not specified - PPO | Attending: Hematology and Oncology

## 2014-11-08 DIAGNOSIS — Z452 Encounter for adjustment and management of vascular access device: Secondary | ICD-10-CM

## 2014-11-08 DIAGNOSIS — Z85038 Personal history of other malignant neoplasm of large intestine: Secondary | ICD-10-CM

## 2014-11-08 DIAGNOSIS — C189 Malignant neoplasm of colon, unspecified: Secondary | ICD-10-CM | POA: Insufficient documentation

## 2014-11-08 MED ORDER — HEPARIN SOD (PORK) LOCK FLUSH 100 UNIT/ML IV SOLN
INTRAVENOUS | Status: AC
  Filled 2014-11-08: qty 5

## 2014-11-08 MED ORDER — HEPARIN SOD (PORK) LOCK FLUSH 100 UNIT/ML IV SOLN
500.0000 [IU] | Freq: Once | INTRAVENOUS | Status: AC
  Administered 2014-11-08: 500 [IU] via INTRAVENOUS

## 2014-11-08 MED ORDER — SODIUM CHLORIDE 0.9 % IJ SOLN
10.0000 mL | INTRAMUSCULAR | Status: DC | PRN
Start: 2014-11-08 — End: 2014-11-08
  Administered 2014-11-08: 10 mL via INTRAVENOUS
  Filled 2014-11-08: qty 10

## 2014-11-08 NOTE — Progress Notes (Signed)
[name] presented for Portacath access and flush. Proper placement of portacath confirmed by CXR. Portacath located left chest wall accessed with  H 20 needle. Good blood return present. Portacath flushed with 67m NS and 500U/550mHeparin and needle removed intact. Procedure without incident. Patient tolerated procedure well.  Patient reported that he felt good this week but had some fatigue the previous 4-5 weeks.  He denies any changes in his bowels and no weight loss, good appetite.  I encouraged him to call with any recurrent fatigue or changes in bowels habits.  He verbalized understanding.  I also reported findings to Tammy, RN and SuCollie SiadRNTherapist, sportss well as my instruction to the patient.  We will follow up with next appointment.

## 2014-11-29 ENCOUNTER — Emergency Department (HOSPITAL_COMMUNITY): Payer: Medicare Other

## 2014-11-29 ENCOUNTER — Encounter (HOSPITAL_COMMUNITY): Payer: Self-pay | Admitting: Emergency Medicine

## 2014-11-29 ENCOUNTER — Inpatient Hospital Stay (HOSPITAL_COMMUNITY)
Admission: EM | Admit: 2014-11-29 | Discharge: 2014-12-03 | DRG: 389 | Disposition: A | Discharge status: Home / Self Care | Payer: Medicare Other | Attending: Internal Medicine | Admitting: Internal Medicine

## 2014-11-29 DIAGNOSIS — K5660 Unspecified intestinal obstruction: Secondary | ICD-10-CM | POA: Diagnosis not present

## 2014-11-29 DIAGNOSIS — Z903 Acquired absence of stomach [part of]: Secondary | ICD-10-CM | POA: Diagnosis present

## 2014-11-29 DIAGNOSIS — Z87891 Personal history of nicotine dependence: Secondary | ICD-10-CM

## 2014-11-29 DIAGNOSIS — Z931 Gastrostomy status: Secondary | ICD-10-CM

## 2014-11-29 DIAGNOSIS — Z9221 Personal history of antineoplastic chemotherapy: Secondary | ICD-10-CM

## 2014-11-29 DIAGNOSIS — R109 Unspecified abdominal pain: Secondary | ICD-10-CM

## 2014-11-29 DIAGNOSIS — N179 Acute kidney failure, unspecified: Secondary | ICD-10-CM | POA: Diagnosis present

## 2014-11-29 DIAGNOSIS — K219 Gastro-esophageal reflux disease without esophagitis: Secondary | ICD-10-CM | POA: Diagnosis present

## 2014-11-29 DIAGNOSIS — K56609 Unspecified intestinal obstruction, unspecified as to partial versus complete obstruction: Secondary | ICD-10-CM

## 2014-11-29 DIAGNOSIS — E860 Dehydration: Secondary | ICD-10-CM | POA: Diagnosis present

## 2014-11-29 DIAGNOSIS — I10 Essential (primary) hypertension: Secondary | ICD-10-CM | POA: Diagnosis present

## 2014-11-29 DIAGNOSIS — Z8249 Family history of ischemic heart disease and other diseases of the circulatory system: Secondary | ICD-10-CM

## 2014-11-29 DIAGNOSIS — Z8673 Personal history of transient ischemic attack (TIA), and cerebral infarction without residual deficits: Secondary | ICD-10-CM

## 2014-11-29 DIAGNOSIS — M810 Age-related osteoporosis without current pathological fracture: Secondary | ICD-10-CM | POA: Diagnosis present

## 2014-11-29 DIAGNOSIS — E785 Hyperlipidemia, unspecified: Secondary | ICD-10-CM | POA: Diagnosis present

## 2014-11-29 DIAGNOSIS — R1012 Left upper quadrant pain: Secondary | ICD-10-CM | POA: Diagnosis not present

## 2014-11-29 DIAGNOSIS — Z9049 Acquired absence of other specified parts of digestive tract: Secondary | ICD-10-CM | POA: Diagnosis present

## 2014-11-29 DIAGNOSIS — Z85038 Personal history of other malignant neoplasm of large intestine: Secondary | ICD-10-CM

## 2014-11-29 DIAGNOSIS — M1990 Unspecified osteoarthritis, unspecified site: Secondary | ICD-10-CM | POA: Diagnosis present

## 2014-11-29 DIAGNOSIS — Z86711 Personal history of pulmonary embolism: Secondary | ICD-10-CM | POA: Diagnosis present

## 2014-11-29 LAB — CBC
HEMATOCRIT: 45.1 % (ref 39.0–52.0)
Hemoglobin: 14.8 g/dL (ref 13.0–17.0)
MCH: 28.8 pg (ref 26.0–34.0)
MCHC: 32.8 g/dL (ref 30.0–36.0)
MCV: 87.9 fL (ref 78.0–100.0)
PLATELETS: 233 10*3/uL (ref 150–400)
RBC: 5.13 MIL/uL (ref 4.22–5.81)
RDW: 15.2 % (ref 11.5–15.5)
WBC: 12.8 10*3/uL — AB (ref 4.0–10.5)

## 2014-11-29 LAB — LIPASE, BLOOD: LIPASE: 23 U/L (ref 22–51)

## 2014-11-29 LAB — COMPREHENSIVE METABOLIC PANEL
ALT: 15 U/L — ABNORMAL LOW (ref 17–63)
AST: 20 U/L (ref 15–41)
Albumin: 4.3 g/dL (ref 3.5–5.0)
Alkaline Phosphatase: 71 U/L (ref 38–126)
Anion gap: 9 (ref 5–15)
BILIRUBIN TOTAL: 0.4 mg/dL (ref 0.3–1.2)
BUN: 13 mg/dL (ref 6–20)
CO2: 26 mmol/L (ref 22–32)
Calcium: 10 mg/dL (ref 8.9–10.3)
Chloride: 106 mmol/L (ref 101–111)
Creatinine, Ser: 1.32 mg/dL — ABNORMAL HIGH (ref 0.61–1.24)
GFR, EST NON AFRICAN AMERICAN: 56 mL/min — AB (ref >60)
Glucose, Bld: 93 mg/dL (ref 65–99)
POTASSIUM: 4.6 mmol/L (ref 3.5–5.1)
Sodium: 141 mmol/L (ref 135–145)
TOTAL PROTEIN: 7.5 g/dL (ref 6.5–8.1)

## 2014-11-29 LAB — LACTIC ACID, PLASMA: LACTIC ACID, VENOUS: 1.1 mmol/L (ref 0.5–2.0)

## 2014-11-29 MED ORDER — MORPHINE SULFATE (PF) 4 MG/ML IV SOLN
4.0000 mg | INTRAVENOUS | Status: AC | PRN
  Administered 2014-11-29 (×2): 4 mg via INTRAVENOUS
  Filled 2014-11-29 (×2): qty 1

## 2014-11-29 MED ORDER — IOHEXOL 300 MG/ML  SOLN
25.0000 mL | Freq: Once | INTRAMUSCULAR | Status: AC | PRN
  Administered 2014-11-29: 25 mL via ORAL

## 2014-11-29 MED ORDER — SODIUM CHLORIDE 0.9 % IV SOLN: INTRAVENOUS | Status: DC

## 2014-11-29 MED ORDER — ONDANSETRON HCL 4 MG/2ML IJ SOLN
4.0000 mg | INTRAMUSCULAR | Status: DC | PRN
  Administered 2014-11-29: 4 mg via INTRAVENOUS
  Filled 2014-11-29: qty 2

## 2014-11-29 MED ORDER — HYDROMORPHONE HCL 1 MG/ML IJ SOLN: 1.0000 mg | INTRAMUSCULAR | Status: DC | PRN

## 2014-11-29 MED ORDER — IOHEXOL 300 MG/ML  SOLN
100.0000 mL | Freq: Once | INTRAMUSCULAR | Status: AC | PRN
Start: 2014-11-29 — End: 2014-11-29
  Administered 2014-11-29: 100 mL via INTRAVENOUS

## 2014-11-29 MED ORDER — ONDANSETRON HCL 4 MG/2ML IJ SOLN
4.0000 mg | Freq: Three times a day (TID) | INTRAMUSCULAR | Status: DC | PRN
  Administered 2014-11-30: 4 mg via INTRAVENOUS
  Filled 2014-11-29: qty 2

## 2014-11-29 MED ORDER — SODIUM CHLORIDE 0.9 % IV SOLN
INTRAVENOUS | Status: DC
  Administered 2014-11-29 – 2014-11-30 (×2): via INTRAVENOUS

## 2014-11-29 NOTE — ED Provider Notes (Signed)
CSN: [id number]     Arrival date & time 11/29/14  1756 History   First MD Initiated Contact with Patient 11/29/14 1921     Chief Complaint  Patient presents with  . Abdominal Pain     HPI Pt was seen at 1920.  Per pt, c/o gradual onset and persistence of constant generalized abd "pain" since earlier today.  Has been associated with nausea and one episode of diarrhea this morning. States he "doesn't think" he has passed gas since this morning.  Describes the abd pain as "sharp" and "it feels like the last time I had a bowel obstruction."  Denies vomiting/diarrhea, no fevers, no back pain, no rash, no CP/SOB, no black or blood in stools.       Past Medical History  Diagnosis Date  . Pulmonary embolism 02/2010  . Hypertension   . Osteoporosis   . Arthritis   . TIA (transient ischemic attack) 10/11  . ETOH abuse     quit 03/2010  . 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 Personal history of PE (pulmonary embolism) 10/01/2010  . Blood transfusion   . S/P endoscopy September 28, 2010    erosive reflux esophagitis, Billroth I anatomy  . Shortness of breath   . Sleep apnea   . Recurrent upper respiratory infection (URI)   . Anxiety   . Pneumonia   . Anemia   . Ileus   . Chronic abdominal pain   . GERD (gastroesophageal reflux disease)   . Chest pain at rest   . Erosive esophagitis   . Vitamin B12 deficiency   . Bowel obstruction 05/13/2012    Recurrent  . Hx of Clostridium difficile infection 01/2012  . Bronchitis   . 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 Obstruction of bowel 03/03/14   Past Surgical History  Procedure Laterality Date  . Hernia repair      right inguinal  . Appendectomy  1980s  . Cholecystectomy  1980s  . Portacath placement    . Abdominal sugery      for bowel obstruction x 8, all in 1980s, except for one in 07/2010  . Esophagogastroduodenoscopy  09/28/2010  . Esophagogastroduodenoscopy  12/01/2010    Cervical web status post dilation,  erosive esophagitis, B1 hemigastrectomy, inflamed anastomosis  . Colonoscopy  03/18/2011    anastomosis at 35cm. Several adenomatous polyps removed. Sigmoid diverticulosis. Next TCS 02/2013  . Esophagogastroduodenoscopy  04/16/2011    excoriation at [facility] c/w trauma/M-W tear, friable gastric anastomosis, dilation efferent limb  . Billroth 1 hemigastrectomy  1980s    per patient for benign duodenal tumor  . Esophagogastroduodenoscopy (egd) with esophageal dilation  02/25/2012    ZJQ:BHALPFXT esophageal web-s/p dilation anddisruption as described above. Status post prior gastric with Billroth I configuration. Abnormal gastric mucosa at the anastomosis. Gastric biopsy showed mild chronic inflammation but no H. pylori   . Colonoscopy N/A 07/24/2012    KWI:OXBDZH post segmental resection with normal-appearing colonic anastomosis aside from an adjacent polyp-removed as described above. Rectal polyp-removed as described above. CT findings appear to have been artifactual. tubular adenomas/prolapsed type polyp.  . Colon surgery  May 2012    left hemicolectomy, colon cancer found at time of surgery for bowel obstruction  . Cardiac catheterization  07/17/2012  . Abdominal adhesion surgery  03/04/15    @ [facility]  . Esophagogastroduodenoscopy N/A 06/03/2014    Procedure: ESOPHAGOGASTRODUODENOSCOPY (EGD);  Surgeon: [name], MD;  [facility]: [facility];  Service: Endoscopy;  Laterality: N/A;  2:45  . Savory dilation N/A 06/03/2014    Procedure: SAVORY DILATION;  Surgeon: [name], MD;  Location: [facility];  Service: Endoscopy;  Laterality: N/A;  [name] dilation N/A 06/03/2014    Procedure: [name] DILATION;  Surgeon: [name], MD;  Location: [facility];  Service: Endoscopy;  Laterality: N/A;   Family History  Problem Relation Age of Onset  . Hypertension Mother   . Arthritis Mother   . Pneumonia Mother   . Hypertension Father   . Heart attack Father    Social History  Substance Use  Topics  . Smoking status: Former Smoker -- 0.50 packs/day for 40 years    Types: Cigarettes    Quit date: 12/20/2012  . Smokeless tobacco: Never Used  . Alcohol Use: No    Review of Systems ROS: Statement: All systems negative except as marked or noted in the HPI; Constitutional: Negative for fever and chills. ; ; Eyes: Negative for eye pain, redness and discharge. ; ; ENMT: Negative for ear pain, hoarseness, nasal congestion, sinus pressure and sore throat. ; ; Cardiovascular: Negative for chest pain, palpitations, diaphoresis, dyspnea and peripheral edema. ; ; Respiratory: Negative for cough, wheezing and stridor. ; ; Gastrointestinal: +nausea, diarrhea, abd pain. Negative for vomiting, blood in stool, hematemesis, jaundice and rectal bleeding. . ; ; Genitourinary: Negative for dysuria, flank pain and hematuria. ; ; Musculoskeletal: Negative for back pain and neck pain. Negative for swelling and trauma.; ; Skin: Negative for pruritus, rash, abrasions, blisters, bruising and skin lesion.; ; Neuro: Negative for headache, lightheadedness and neck stiffness. Negative for weakness, altered level of consciousness , altered mental status, extremity weakness, paresthesias, involuntary movement, seizure and syncope.      Allergies  Review of patient's allergies indicates no known allergies.  Home Medications   Prior to Admission medications   Medication Sig Start Date End Date Taking? Authorizing Provider  atorvastatin (LIPITOR) 20 MG tablet Take 20 mg by mouth daily.   Yes Historical Provider, MD  cholecalciferol (VITAMIN D) 1000 UNITS tablet Take 1,000 Units by mouth every morning.   Yes Historical Provider, MD  dexlansoprazole (DEXILANT) 60 MG capsule Take 1 capsule (60 mg total) by mouth every morning. 06/11/14  Yes Orvil Feil, NP  docusate sodium (COLACE) 100 MG capsule Take 100 mg by mouth 2 (two) times daily.   Yes Historical Provider, MD  enoxaparin (LOVENOX) 60 MG/0.6ML injection Inject 60  mg into the skin every evening.    Yes Historical Provider, MD  ENSURE PLUS (ENSURE PLUS) LIQD Take 237 mLs by mouth daily. Every other day   Yes Historical Provider, MD  ferrous [id number] C-folic acid (FEROCON) capsule Take 1 capsule by mouth daily.   Yes Historical Provider, MD  ferrous sulfate 325 (65 FE) MG tablet Take 325 mg by mouth daily with breakfast.   Yes Historical Provider, MD  folic acid (FOLVITE) 1 MG tablet Take 1 mg by mouth every morning.    Yes Historical Provider, MD  lidocaine-prilocaine (EMLA) cream Apply 1 application topically as needed. 03/29/14  Yes Patrici Ranks, MD  LORazepam (ATIVAN) 1 MG tablet Take 1 mg by mouth every 4 (four) hours as needed. anxiety   Yes Historical Provider, MD  Melatonin 10 MG CAPS Take 10 mg by mouth at bedtime.   Yes Historical Provider, MD  Multiple Vitamins-Minerals (MULTIVITAMINS THER. W/MINERALS) TABS tablet Take 1 tablet by mouth daily.   Yes Historical  Provider, MD  Probiotic Product (PROBIOTIC DAILY) CAPS Take 1 capsule by mouth daily.   Yes Historical Provider, MD  psyllium (REGULOID) 0.52 G capsule Take 5 capsules by mouth 2 (two) times daily.    Yes Historical Provider, MD  sertraline (ZOLOFT) 100 MG tablet Take 150 mg by mouth at bedtime.    Yes Historical Provider, MD  thiamine 100 MG tablet Take 100 mg by mouth every morning.    Yes Historical Provider, MD  valsartan-hydrochlorothiazide (DIOVAN-HCT) 80-12.5 MG per tablet Take 1 tablet by mouth every morning.    Yes Historical Provider, MD  Wheat Dextrin (BENEFIBER PO) Take 1 each by mouth daily as needed (to regulate bowel movements/for supplement).    Yes Historical Provider, MD  albuterol (PROAIR HFA) 108 (90 BASE) MCG/ACT inhaler Inhale into the lungs every 6 (six) hours as needed for wheezing or shortness of breath.    Historical Provider, MD  alendronate (FOSAMAX) 70 MG tablet Take 70 mg by mouth every 7 (seven) days. On sundays 10/14/11   Historical Provider, MD   cyanocobalamin (,VITAMIN B-12,) 1000 MCG/ML injection Inject 1,000 mcg into the muscle every 30 (thirty) days.    Historical Provider, MD  cyclobenzaprine (FLEXERIL) 10 MG tablet Take 10 mg by mouth daily as needed for muscle spasms.  11/18/11   Historical Provider, MD  ondansetron (ZOFRAN) 4 MG tablet Take 1 tablet (4 mg total) by mouth every 8 (eight) hours as needed for nausea. 02/22/14   Aviva Signs, MD  polyethylene glycol powder (GLYCOLAX/MIRALAX) powder Take 17 g by mouth as needed. For constipation    Historical Provider, MD  prochlorperazine (COMPAZINE) 10 MG tablet Take 10 mg by mouth every 6 (six) hours as needed. Nausea and Vomiting    Historical Provider, MD  sucralfate (CARAFATE) 1 G tablet Take 1 g by mouth 4 (four) times daily.    Historical Provider, MD  sucralfate (CARAFATE) 1 GM/10ML suspension Take 10 mLs (1 g total) by mouth 4 (four) times daily. As needed 05/01/13   Mahala Menghini, PA-C  traMADol (ULTRAM) 50 MG tablet Take 50 mg by mouth every 6 (six) hours as needed for moderate pain or severe pain.     Historical Provider, MD  Vitamin D, Ergocalciferol, (DRISDOL) 50000 UNITS CAPS Take 50,000 Units by mouth once a week. Takes on Sunday 03/13/12   Historical Provider, MD   BP 134/83 mmHg  Pulse 109  Temp(Src) 97.8 F (36.6 C) (Oral)  Resp 19  Ht '5\' 9"'$  (1.753 m)  Wt 145 lb (65.772 kg)  BMI 21.40 kg/m2  SpO2 100% Physical Exam  1925: Physical examination:  Nursing notes reviewed; Vital signs and O2 SAT reviewed;  Constitutional: Well developed, Well nourished, Well hydrated, In no acute distress; Head:  Normocephalic, atraumatic; Eyes: EOMI, PERRL, No scleral icterus; ENMT: Mouth and pharynx normal, Mucous membranes moist; Neck: Supple, Full range of motion, No lymphadenopathy; Cardiovascular: Regular rate and rhythm, No gallop; Respiratory: Breath sounds clear & equal bilaterally, No wheezes.  Speaking full sentences with ease, Normal respiratory effort/excursion; Chest:  Nontender, Movement normal; Abdomen: Soft, +mild diffuse tenderness to palp. No rebound or guarding. Well-healed surgical scars on abd wall. Nondistended, Normal bowel sounds; Genitourinary: No CVA tenderness; Extremities: Pulses normal, No tenderness, No edema, No calf edema or asymmetry.; Neuro: AA&Ox3, Major CN grossly intact.  Speech clear. No gross focal motor or sensory deficits in extremities.; Skin: Color normal, Warm, Dry.    ED Course  Procedures (including critical care time) Labs  Review   Imaging Review  I have personally reviewed and evaluated these images and lab results as part of my medical decision-making.   EKG Interpretation None      MDM  MDM Reviewed: previous chart, nursing note and vitals Reviewed previous: labs Interpretation: labs and CT scan      Results for orders placed or performed during the hospital encounter of 11/29/14  CBC  Result Value Ref Range   WBC 12.8 (H) 4.0 - 10.5 K/uL   RBC 5.13 4.22 - 5.81 MIL/uL   Hemoglobin 14.8 13.0 - 17.0 g/dL   HCT 45.1 39.0 - 52.0 %   MCV 87.9 78.0 - 100.0 fL   MCH 28.8 26.0 - 34.0 pg   MCHC 32.8 30.0 - 36.0 g/dL   RDW 15.2 11.5 - 15.5 %   Platelets 233 150 - 400 K/uL  Comprehensive metabolic panel  Result Value Ref Range   Sodium 141 135 - 145 mmol/L   Potassium 4.6 3.5 - 5.1 mmol/L   Chloride 106 101 - 111 mmol/L   CO2 26 22 - 32 mmol/L   Glucose, Bld 93 65 - 99 mg/dL   BUN 13 6 - 20 mg/dL   Creatinine, Ser 1.32 (H) 0.61 - 1.24 mg/dL   Calcium 10.0 8.9 - 10.3 mg/dL   Total Protein 7.5 6.5 - 8.1 g/dL   Albumin 4.3 3.5 - 5.0 g/dL   AST 20 15 - 41 U/L   ALT 15 (L) 17 - 63 U/L   Alkaline Phosphatase 71 38 - 126 U/L   Total Bilirubin 0.4 0.3 - 1.2 mg/dL   GFR calc non Af Amer 56 (L) >60 mL/min   GFR calc Af Amer >60 >60 mL/min   Anion gap 9 5 - 15  Lipase, blood  Result Value Ref Range   Lipase 23 22 - 51 U/L  Lactic acid, plasma  Result Value Ref Range   Lactic Acid, Venous 1.1 0.5 - 2.0  mmol/L   Ct Abdomen Pelvis W Contrast 11/29/2014   CLINICAL DATA:  Abdominal pain and nausea. Patient was seen at [facility] today. Patient was told that he had bowel obstruction and was sent to the hospital.  EXAM: CT ABDOMEN AND PELVIS WITH CONTRAST  TECHNIQUE: Multidetector CT imaging of the abdomen and pelvis was performed using the standard protocol following bolus administration of intravenous contrast.  CONTRAST:  148m OMNIPAQUE IOHEXOL 300 MG/ML SOLN, 259mOMNIPAQUE IOHEXOL 300 MG/ML SOLN  COMPARISON:  02/18/2014  FINDINGS: Scarring in the lung bases.  Partial gastric resection. There is mild distention of the stomach and proximal small bowel with some fluid-filled small bowel loops and air-fluid levels. This appearance is similar to the prior study and may represent a chronic condition. Postoperative changes with resection of much of the colon and anastomosis at the sigmoid region. There is a dilated loop of colon at the level of the anastomosis. This is unchanged since prior study and is likely postoperative. Can't exclude bowel wall thickening in the left upper quadrant loops although this may just be due to under distention.  Surgical absence of the gallbladder. Mild bile duct dilatation is consistent with post cholecystectomy physiology. Liver, spleen, pancreas, adrenal glands, abdominal aorta, inferior vena cava, and retroperitoneal lymph nodes are unremarkable. Cysts in the kidneys, largest in the right kidney measures 4.4 cm. No hydronephrosis or hydroureter. Nonobstructing stone in the upper pole of both kidneys. Mild parenchymal scarring in the kidneys. No free fluid or free air in  the abdomen.  Pelvis: Prostate gland is enlarged. Bladder wall is not thickened. No free or loculated pelvic fluid collections. No pelvic mass or lymphadenopathy. Degenerative changes in the spine and hips. No destructive bone lesions.  IMPRESSION: Postoperative changes with partial colectomy and anastomosis in the  sigmoid region. Dilated loop of colon proximal to the anastomosis. Mild dilated stomach and proximal small bowel. Appearance is similar to prior study and may represent a chronic condition versus recurrent small bowel obstruction. There is evidence of wall thickening in some of the left upper quadrant jejunum although this may partially be due to under distention. Anatomy is significantly altered due to postoperative changes. Prostate gland is enlarged. Nonobstructing stones in both kidneys.   Electronically Signed   By: Lucienne Capers M.D.   On: 11/29/2014 21:46    2155:  Pt continues to c/o abd pain and nausea despite multiple doses of IV meds for same. Will re-medicate, place NGT, admit.  T/C to General Surgery Dr. Arnoldo Morale, case discussed, including:  HPI, pertinent PM/SHx, VS/PE, dx testing, ED course and treatment:  Knows pt well, agreeable to consult, requests to admit to medicine service.   T/C to [facility] Dr. Hal Hope, case discussed, including:  HPI, pertinent PM/SHx, VS/PE, dx testing, ED course and treatment:  Agreeable to admit, requests to write temporary orders, obtain medical bed to team [facility].   Francine Graven, DO 12/03/14 1524

## 2014-11-29 NOTE — ED Notes (Signed)
Patient c/o abd pain with nausea. Per patient was seen at [facility] today and had x-rays. Patient told he had bowel obstruction and was told to come to the hospital to be admitted. Patient has copy of x-rays of disc with him. Denies any vomiting.

## 2014-11-30 ENCOUNTER — Inpatient Hospital Stay (HOSPITAL_COMMUNITY): Payer: Medicare Other

## 2014-11-30 ENCOUNTER — Encounter (HOSPITAL_COMMUNITY): Payer: Self-pay | Admitting: Internal Medicine

## 2014-11-30 DIAGNOSIS — E785 Hyperlipidemia, unspecified: Secondary | ICD-10-CM | POA: Diagnosis present

## 2014-11-30 DIAGNOSIS — Z9049 Acquired absence of other specified parts of digestive tract: Secondary | ICD-10-CM | POA: Diagnosis present

## 2014-11-30 DIAGNOSIS — Z87891 Personal history of nicotine dependence: Secondary | ICD-10-CM | POA: Diagnosis not present

## 2014-11-30 DIAGNOSIS — M1990 Unspecified osteoarthritis, unspecified site: Secondary | ICD-10-CM | POA: Diagnosis present

## 2014-11-30 DIAGNOSIS — Z9221 Personal history of antineoplastic chemotherapy: Secondary | ICD-10-CM | POA: Diagnosis not present

## 2014-11-30 DIAGNOSIS — N179 Acute kidney failure, unspecified: Secondary | ICD-10-CM | POA: Diagnosis present

## 2014-11-30 DIAGNOSIS — Z903 Acquired absence of stomach [part of]: Secondary | ICD-10-CM | POA: Diagnosis present

## 2014-11-30 DIAGNOSIS — Z85038 Personal history of other malignant neoplasm of large intestine: Secondary | ICD-10-CM | POA: Diagnosis not present

## 2014-11-30 DIAGNOSIS — K219 Gastro-esophageal reflux disease without esophagitis: Secondary | ICD-10-CM | POA: Diagnosis present

## 2014-11-30 DIAGNOSIS — I10 Essential (primary) hypertension: Secondary | ICD-10-CM

## 2014-11-30 DIAGNOSIS — Z8673 Personal history of transient ischemic attack (TIA), and cerebral infarction without residual deficits: Secondary | ICD-10-CM | POA: Diagnosis not present

## 2014-11-30 DIAGNOSIS — E860 Dehydration: Secondary | ICD-10-CM | POA: Diagnosis present

## 2014-11-30 DIAGNOSIS — K5660 Unspecified intestinal obstruction: Secondary | ICD-10-CM | POA: Diagnosis present

## 2014-11-30 DIAGNOSIS — M810 Age-related osteoporosis without current pathological fracture: Secondary | ICD-10-CM | POA: Diagnosis present

## 2014-11-30 DIAGNOSIS — K5669 Other intestinal obstruction: Secondary | ICD-10-CM | POA: Diagnosis not present

## 2014-11-30 DIAGNOSIS — Z86711 Personal history of pulmonary embolism: Secondary | ICD-10-CM | POA: Diagnosis not present

## 2014-11-30 DIAGNOSIS — R1012 Left upper quadrant pain: Secondary | ICD-10-CM | POA: Diagnosis present

## 2014-11-30 DIAGNOSIS — Z8249 Family history of ischemic heart disease and other diseases of the circulatory system: Secondary | ICD-10-CM | POA: Diagnosis not present

## 2014-11-30 LAB — COMPREHENSIVE METABOLIC PANEL
ALBUMIN: 3.6 g/dL (ref 3.5–5.0)
ALK PHOS: 68 U/L (ref 38–126)
ALT: 28 U/L (ref 17–63)
AST: 63 U/L — AB (ref 15–41)
Anion gap: 6 (ref 5–15)
BILIRUBIN TOTAL: 0.5 mg/dL (ref 0.3–1.2)
BUN: 12 mg/dL (ref 6–20)
CO2: 25 mmol/L (ref 22–32)
Calcium: 8.2 mg/dL — ABNORMAL LOW (ref 8.9–10.3)
Chloride: 108 mmol/L (ref 101–111)
Creatinine, Ser: 1.07 mg/dL (ref 0.61–1.24)
GFR calc Af Amer: >60 mL/min (ref >60)
GFR calc non Af Amer: >60 mL/min (ref >60)
GLUCOSE: 105 mg/dL — AB (ref 65–99)
POTASSIUM: 3.9 mmol/L (ref 3.5–5.1)
Sodium: 139 mmol/L (ref 135–145)
TOTAL PROTEIN: 6.2 g/dL — AB (ref 6.5–8.1)

## 2014-11-30 LAB — URINALYSIS, ROUTINE W REFLEX MICROSCOPIC
BILIRUBIN URINE: NEGATIVE
GLUCOSE, UA: NEGATIVE mg/dL
HGB URINE DIPSTICK: NEGATIVE
KETONES UR: NEGATIVE mg/dL
Leukocytes, UA: NEGATIVE
NITRITE: NEGATIVE
PH: 6 (ref 5.0–8.0)
Protein, ur: NEGATIVE mg/dL
SPECIFIC GRAVITY, URINE: 1.02 (ref 1.005–1.030)
Urobilinogen, UA: 0.2 mg/dL (ref 0.0–1.0)

## 2014-11-30 LAB — CBC WITH DIFFERENTIAL/PLATELET
BASOS ABS: 0 10*3/uL (ref 0.0–0.1)
Basophils Relative: 0 % (ref 0–1)
EOS PCT: 2 % (ref 0–5)
Eosinophils Absolute: 0.3 10*3/uL (ref 0.0–0.7)
HCT: 39.5 % (ref 39.0–52.0)
Hemoglobin: 13.1 g/dL (ref 13.0–17.0)
LYMPHS PCT: 36 % (ref 12–46)
Lymphs Abs: 4.1 10*3/uL — ABNORMAL HIGH (ref 0.7–4.0)
MCH: 29.2 pg (ref 26.0–34.0)
MCHC: 33.2 g/dL (ref 30.0–36.0)
MCV: 88 fL (ref 78.0–100.0)
MONO ABS: 0.9 10*3/uL (ref 0.1–1.0)
MONOS PCT: 8 % (ref 3–12)
Neutro Abs: 6.1 10*3/uL (ref 1.7–7.7)
Neutrophils Relative %: 54 % (ref 43–77)
PLATELETS: 183 10*3/uL (ref 150–400)
RBC: 4.49 MIL/uL (ref 4.22–5.81)
RDW: 15.4 % (ref 11.5–15.5)
WBC: 11.4 10*3/uL — ABNORMAL HIGH (ref 4.0–10.5)

## 2014-11-30 LAB — GLUCOSE, CAPILLARY
GLUCOSE-CAPILLARY: 80 mg/dL (ref 65–99)
Glucose-Capillary: 111 mg/dL — ABNORMAL HIGH (ref 65–99)
Glucose-Capillary: 98 mg/dL (ref 65–99)

## 2014-11-30 MED ORDER — ONDANSETRON HCL 4 MG PO TABS
4.0000 mg | ORAL_TABLET | Freq: Four times a day (QID) | ORAL | Status: DC | PRN
Start: 2014-11-30 — End: 2014-12-03

## 2014-11-30 MED ORDER — HYDRALAZINE HCL 20 MG/ML IJ SOLN
10.0000 mg | INTRAMUSCULAR | Status: DC | PRN
  Administered 2014-11-30: 10 mg via INTRAVENOUS
  Filled 2014-11-30: qty 1

## 2014-11-30 MED ORDER — HYDROMORPHONE HCL 1 MG/ML IJ SOLN
1.0000 mg | INTRAMUSCULAR | Status: DC | PRN
  Administered 2014-11-30 – 2014-12-03 (×22): 1 mg via INTRAVENOUS
  Filled 2014-11-30 (×22): qty 1

## 2014-11-30 MED ORDER — DEXTROSE-NACL 5-0.9 % IV SOLN
INTRAVENOUS | Status: AC
  Administered 2014-11-30 (×2): via INTRAVENOUS

## 2014-11-30 MED ORDER — ALBUTEROL SULFATE (2.5 MG/3ML) 0.083% IN NEBU: 3.0000 mL | INHALATION_SOLUTION | Freq: Four times a day (QID) | RESPIRATORY_TRACT | Status: DC | PRN

## 2014-11-30 MED ORDER — ENOXAPARIN SODIUM 60 MG/0.6ML ~~LOC~~ SOLN
60.0000 mg | Freq: Every evening | SUBCUTANEOUS | Status: DC
  Administered 2014-11-30 – 2014-12-02 (×4): 60 mg via SUBCUTANEOUS
  Filled 2014-11-30 (×4): qty 0.6

## 2014-11-30 MED ORDER — PANTOPRAZOLE SODIUM 40 MG IV SOLR
40.0000 mg | Freq: Two times a day (BID) | INTRAVENOUS | Status: DC
  Administered 2014-11-30 – 2014-12-02 (×6): 40 mg via INTRAVENOUS
  Filled 2014-11-30 (×7): qty 40

## 2014-11-30 MED ORDER — ONDANSETRON HCL 4 MG/2ML IJ SOLN
4.0000 mg | Freq: Four times a day (QID) | INTRAMUSCULAR | Status: DC | PRN
  Administered 2014-11-30 – 2014-12-02 (×8): 4 mg via INTRAVENOUS
  Filled 2014-11-30 (×8): qty 2

## 2014-11-30 MED ORDER — ACETAMINOPHEN 650 MG RE SUPP: 650.0000 mg | Freq: Four times a day (QID) | RECTAL | Status: DC | PRN

## 2014-11-30 MED ORDER — HYDROMORPHONE HCL 1 MG/ML IJ SOLN
1.0000 mg | INTRAMUSCULAR | Status: DC | PRN
  Administered 2014-11-30: 1 mg via INTRAVENOUS
  Filled 2014-11-30: qty 1

## 2014-11-30 MED ORDER — ACETAMINOPHEN 325 MG PO TABS: 650.0000 mg | ORAL_TABLET | Freq: Four times a day (QID) | ORAL | Status: DC | PRN

## 2014-11-30 NOTE — Progress Notes (Signed)
PROGRESS NOTE  [name] EXH:[id number] DOB: 11-03-1950 DOA: 11/29/2014 PCP: [name], MD   HPI: 64 y.o. 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 General surgery was consulted and patient was admitted to the hospitalist service.   Subjective / 24 H Interval events - feeling about the same this morning - had a small BM this am  Assessment/Plan: Principal Problem:   SBO (small bowel obstruction) Active Problems:   Personal history of PE (pulmonary embolism)   Essential hypertension    Small bowel obstruction  - Continue nothing by mouth, Gen. surgery consulted - Continue IV fluids History of recurrent PE - Continue his home Lovenox Hypertension - IV hydralazine as needed Acute renal failure - Likely from dehydration, continue IV fluids, repeat BMP tomorrow morning History of colon cancer - Status post surgery at [facility], chemotherapy last 2012 Hyperlipidemia - Hold statin as patient's nothing by mouth   Diet: Diet NPO time specified Fluids: D5NS DVT Prophylaxis: Lovenox  Code Status: Full Code Family Communication: No family at bedside  Disposition Plan: Home when ready   Consultants:  General surgery   Procedures:  None    Antibiotics  Anti-infectives    None       Studies  Dg Chest 1 View  11/30/2014   CLINICAL DATA:  NG tube placement  EXAM: CHEST  1 VIEW  COMPARISON:  08/15/2014  FINDINGS: Left-sided Port-A-Cath noted with tip over the upper SVC. The aorta is unfolded and ectatic. Heart size is normal. Nasogastric tube tip terminates below the level of the hemidiaphragms but is not included in the field of view. Lungs are clear.  IMPRESSION: Nasogastric tube tip terminates below the level of the hemidiaphragms but the tip is not included in the field of view. Abdominal radiograph  obtained and will be dictated separately.   Electronically Signed   By: Conchita Paris M.D.   On: 11/30/2014 07:18   Ct Abdomen Pelvis W Contrast  11/29/2014   CLINICAL DATA:  Abdominal pain and nausea. Patient was seen at [facility] today. Patient was told that he had bowel obstruction and was sent to the hospital.  EXAM: CT ABDOMEN AND PELVIS WITH CONTRAST  TECHNIQUE: Multidetector CT imaging of the abdomen and pelvis was performed using the standard protocol following bolus administration of intravenous contrast.  CONTRAST:  148m OMNIPAQUE IOHEXOL 300 MG/ML SOLN, 263mOMNIPAQUE IOHEXOL 300 MG/ML SOLN  COMPARISON:  02/18/2014  FINDINGS: Scarring in the lung bases.  Partial gastric resection. There is mild distention of the stomach and proximal small bowel with some fluid-filled small bowel loops and air-fluid levels. This appearance is similar to the prior study and may represent a chronic condition. Postoperative changes with resection of much of the colon and anastomosis at the sigmoid region. There is a dilated loop of colon at the level of the anastomosis. This is unchanged since prior study and is likely postoperative. Can't exclude bowel wall thickening in the left upper quadrant loops although this may just be due to under distention.  Surgical absence of the gallbladder. Mild bile duct dilatation is consistent with post cholecystectomy physiology. Liver, spleen, pancreas, adrenal glands, abdominal aorta, inferior vena cava, and retroperitoneal lymph nodes are unremarkable. Cysts in the kidneys, largest in the right kidney measures 4.4 cm. No hydronephrosis or hydroureter. Nonobstructing stone in the upper pole of both kidneys.  Mild parenchymal scarring in the kidneys. No free fluid or free air in the abdomen.  Pelvis: Prostate gland is enlarged. Bladder wall is not thickened. No free or loculated pelvic fluid collections. No pelvic mass or lymphadenopathy. Degenerative changes in the spine and hips.  No destructive bone lesions.  IMPRESSION: Postoperative changes with partial colectomy and anastomosis in the sigmoid region. Dilated loop of colon proximal to the anastomosis. Mild dilated stomach and proximal small bowel. Appearance is similar to prior study and may represent a chronic condition versus recurrent small bowel obstruction. There is evidence of wall thickening in some of the left upper quadrant jejunum although this may partially be due to under distention. Anatomy is significantly altered due to postoperative changes. Prostate gland is enlarged. Nonobstructing stones in both kidneys.   Electronically Signed   By: Lucienne Capers M.D.   On: 11/29/2014 21:46    Objective  Filed Vitals:   11/29/14 2200 11/29/14 2230 11/29/14 2310 11/30/14 0601  BP: 148/99 138/114 166/95 116/81  Pulse: 89 84 85 87  Temp:   98 F (36.7 C) 98 F (36.7 C)  TempSrc:   Oral Oral  Resp:   18 20  Height:   '5\' 9"'$  (1.753 m)   Weight:      SpO2: 100% 99% 100% 100%   No intake or output data in the 24 hours ending 11/30/14 0728 Filed Weights   11/29/14 1830  Weight: 65.772 kg (145 lb)    Exam:  GENERAL: NAD, NG in place  HEENT: head NCAT, no scleral icterus.  NECK: Supple.   LUNGS: Clear to auscultation. No wheezing or crackles  HEART: Regular rate and rhythm without murmur. 2+ pulses, no JVD, no peripheral edema  ABDOMEN: Soft, nontender, and nondistended. Decreased bowel sounds.   EXTREMITIES: Without any cyanosis, clubbing, rash, lesions or edema.  NEUROLOGIC: Alert and oriented x3. Strength 5/5 in all 4.  Data Reviewed: Basic Metabolic Panel:  Recent Labs Lab 11/29/14 1856  NA 141  K 4.6  CL 106  CO2 26  GLUCOSE 93  BUN 13  CREATININE 1.32*  CALCIUM 10.0   Liver Function Tests:  Recent Labs Lab 11/29/14 1856  AST 20  ALT 15*  ALKPHOS 71  BILITOT 0.4  PROT 7.5  ALBUMIN 4.3    Recent Labs Lab 11/29/14 1856  LIPASE 23   CBC:  Recent Labs Lab  11/29/14 1856 11/30/14 0608  WBC 12.8* 11.4*  NEUTROABS  --  6.1  HGB 14.8 13.1  HCT 45.1 39.5  MCV 87.9 88.0  PLT 233 183    Scheduled Meds: . enoxaparin  60 mg Subcutaneous QPM  . pantoprazole (PROTONIX) IV  40 mg Intravenous Q12H   Continuous Infusions: . dextrose 5 % and 0.9% NaCl 100 mL/hr at 11/30/14 0325     Marzetta Board, MD [facility] Pager [phone]. If 7 PM - 7 AM, please contact night-coverage at [email or web address], password [email or web address] 11/30/2014, 7:28 AM  LOS: 0 days

## 2014-11-30 NOTE — Consult Note (Signed)
Reason for Consult: Recurrent bowel dysfunction Referring Physician: Hospitalist  [name] is an 64 y.o. male.  HPI: Patient is a 64 year old white male well known to me with a history of bowel dysfunction. He has had multiple various intra-abdominal surgeries in the past. He has frequent admissions for partial bowel obstructions which resolve with NG tube decompression. Given his multiple surgeries in the past, surgery is not an option for him given his severe dysfunction. He was admitted to the hospital yesterday evening with a 24-hour history of worsening nausea. After admission, he had a bowel movement. The nature. He denies any significant abdominal pain.  Past Medical History  Diagnosis Date  . Pulmonary embolism 02/2010  . Hypertension   . Osteoporosis   . Arthritis   . TIA (transient ischemic attack) 10/11  . ETOH abuse     quit 03/2010  . 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 Personal history of PE (pulmonary embolism) 10/01/2010  . Blood transfusion   . S/P endoscopy September 28, 2010    erosive reflux esophagitis, Billroth I anatomy  . Shortness of breath   . Sleep apnea   . Recurrent upper respiratory infection (URI)   . Anxiety   . Pneumonia   . Anemia   . Ileus   . Chronic abdominal pain   . GERD (gastroesophageal reflux disease)   . Chest pain at rest   . Erosive esophagitis   . Vitamin B12 deficiency   . Bowel obstruction 05/13/2012    Recurrent  . Hx of Clostridium difficile infection 01/2012  . Bronchitis   . 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 Obstruction of bowel 03/03/14    Past Surgical History  Procedure Laterality Date  . Hernia repair      right inguinal  . Appendectomy  1980s  . Cholecystectomy  1980s  . Portacath placement    . Abdominal sugery      for bowel obstruction x 8, all in 1980s, except for one in 07/2010  . Esophagogastroduodenoscopy  09/28/2010  . Esophagogastroduodenoscopy  12/01/2010    Cervical web  status post dilation, erosive esophagitis, B1 hemigastrectomy, inflamed anastomosis  . Colonoscopy  03/18/2011    anastomosis at 35cm. Several adenomatous polyps removed. Sigmoid diverticulosis. Next TCS 02/2013  . Esophagogastroduodenoscopy  04/16/2011    excoriation at GEJ c/w trauma/M-W tear, friable gastric anastomosis, dilation efferent limb  . Billroth 1 hemigastrectomy  1980s    per patient for benign duodenal tumor  . Esophagogastroduodenoscopy (egd) with esophageal dilation  02/25/2012    ERD:EYCXKGYJ esophageal web-s/p dilation anddisruption as described above. Status post prior gastric with Billroth I configuration. Abnormal gastric mucosa at the anastomosis. Gastric biopsy showed mild chronic inflammation but no H. pylori   . Colonoscopy N/A 07/24/2012    EHU:DJSHFW post segmental resection with normal-appearing colonic anastomosis aside from an adjacent polyp-removed as described above. Rectal polyp-removed as described above. CT findings appear to have been artifactual. tubular adenomas/prolapsed type polyp.  . Colon surgery  May 2012    left hemicolectomy, colon cancer found at time of surgery for bowel obstruction  . Cardiac catheterization  07/17/2012  . Abdominal adhesion surgery  03/04/15    @ [facility]  . Esophagogastroduodenoscopy N/A 06/03/2014    Procedure: ESOPHAGOGASTRODUODENOSCOPY (EGD);  Surgeon: [name], MD;  Location: [facility];  Service: Endoscopy;  Laterality: N/A;  2:45  . Savory dilation N/A 06/03/2014    Procedure: SAVORY DILATION;  Surgeon: [name], MD;  Location: [facility];  Service: Endoscopy;  Laterality: N/A;  [name] dilation N/A 06/03/2014    Procedure: [name] DILATION;  Surgeon: [name], MD;  Location: [facility];  Service: Endoscopy;  Laterality: N/A;    Family History  Problem Relation Age of Onset  . Hypertension Mother   . Arthritis Mother   . Pneumonia Mother   . Hypertension Father   . Heart attack Father      Social History:  reports that he quit smoking about 1 years ago. His smoking use included Cigarettes. He has a 20 pack-year smoking history. He has never used smokeless tobacco. He reports that he does not drink alcohol or use illicit drugs.  Allergies: No Known Allergies  Medications: I have reviewed the patient's current medications.  Results for orders placed or performed during the hospital encounter of 11/29/14 (from the past 48 hour(s))  CBC     Status: Abnormal   Collection Time: 11/29/14  6:56 PM  Result Value Ref Range   WBC 12.8 (H) 4.0 - 10.5 K/uL   RBC 5.13 4.22 - 5.81 MIL/uL   Hemoglobin 14.8 13.0 - 17.0 g/dL   HCT 45.1 39.0 - 52.0 %   MCV 87.9 78.0 - 100.0 fL   MCH 28.8 26.0 - 34.0 pg   MCHC 32.8 30.0 - 36.0 g/dL   RDW 15.2 11.5 - 15.5 %   Platelets 233 150 - 400 K/uL  Comprehensive metabolic panel     Status: Abnormal   Collection Time: 11/29/14  6:56 PM  Result Value Ref Range   Sodium 141 135 - 145 mmol/L   Potassium 4.6 3.5 - 5.1 mmol/L   Chloride 106 101 - 111 mmol/L   CO2 26 22 - 32 mmol/L   Glucose, Bld 93 65 - 99 mg/dL   BUN 13 6 - 20 mg/dL   Creatinine, Ser 1.32 (H) 0.61 - 1.24 mg/dL   Calcium 10.0 8.9 - 10.3 mg/dL   Total Protein 7.5 6.5 - 8.1 g/dL   Albumin 4.3 3.5 - 5.0 g/dL   AST 20 15 - 41 U/L   ALT 15 (L) 17 - 63 U/L   Alkaline Phosphatase 71 38 - 126 U/L   Total Bilirubin 0.4 0.3 - 1.2 mg/dL   GFR calc non Af Amer 56 (L) >60 mL/min   GFR calc Af Amer >60 >60 mL/min    Comment: (NOTE) The eGFR has been calculated using the CKD EPI equation. This calculation has not been validated in all clinical situations. eGFR's persistently <60 mL/min signify possible Chronic Kidney Disease.    Anion gap 9 5 - 15  Lipase, blood     Status: None   Collection Time: 11/29/14  6:56 PM  Result Value Ref Range   Lipase 23 22 - 51 U/L  Lactic acid, plasma     Status: None   Collection Time: 11/29/14  8:06 PM  Result Value Ref Range   Lactic Acid,  Venous 1.1 0.5 - 2.0 mmol/L  Comprehensive metabolic panel     Status: Abnormal   Collection Time: 11/30/14  6:08 AM  Result Value Ref Range   Sodium 139 135 - 145 mmol/L   Potassium 3.9 3.5 - 5.1 mmol/L   Chloride 108 101 - 111 mmol/L   CO2 25 22 - 32 mmol/L   Glucose, Bld 105 (H) 65 - 99 mg/dL   BUN 12 6 - 20 mg/dL   Creatinine, Ser  1.07 0.61 - 1.24 mg/dL   Calcium 8.2 (L) 8.9 - 10.3 mg/dL   Total Protein 6.2 (L) 6.5 - 8.1 g/dL   Albumin 3.6 3.5 - 5.0 g/dL   AST 63 (H) 15 - 41 U/L   ALT 28 17 - 63 U/L   Alkaline Phosphatase 68 38 - 126 U/L   Total Bilirubin 0.5 0.3 - 1.2 mg/dL   GFR calc non Af Amer >60 >60 mL/min   GFR calc Af Amer >60 >60 mL/min    Comment: (NOTE) The eGFR has been calculated using the CKD EPI equation. This calculation has not been validated in all clinical situations. eGFR's persistently <60 mL/min signify possible Chronic Kidney Disease.    Anion gap 6 5 - 15  CBC WITH DIFFERENTIAL     Status: Abnormal   Collection Time: 11/30/14  6:08 AM  Result Value Ref Range   WBC 11.4 (H) 4.0 - 10.5 K/uL   RBC 4.49 4.22 - 5.81 MIL/uL   Hemoglobin 13.1 13.0 - 17.0 g/dL   HCT 39.5 39.0 - 52.0 %   MCV 88.0 78.0 - 100.0 fL   MCH 29.2 26.0 - 34.0 pg   MCHC 33.2 30.0 - 36.0 g/dL   RDW 15.4 11.5 - 15.5 %   Platelets 183 150 - 400 K/uL   Neutrophils Relative % 54 43 - 77 %   Neutro Abs 6.1 1.7 - 7.7 K/uL   Lymphocytes Relative 36 12 - 46 %   Lymphs Abs 4.1 (H) 0.7 - 4.0 K/uL   Monocytes Relative 8 3 - 12 %   Monocytes Absolute 0.9 0.1 - 1.0 K/uL   Eosinophils Relative 2 0 - 5 %   Eosinophils Absolute 0.3 0.0 - 0.7 K/uL   Basophils Relative 0 0 - 1 %   Basophils Absolute 0.0 0.0 - 0.1 K/uL    Dg Chest 1 View  11/30/2014   CLINICAL DATA:  NG tube placement  EXAM: CHEST  1 VIEW  COMPARISON:  08/15/2014  FINDINGS: Left-sided Port-A-Cath noted with tip over the upper SVC. The aorta is unfolded and ectatic. Heart size is normal. Nasogastric tube tip terminates below  the level of the hemidiaphragms but is not included in the field of view. Lungs are clear.  IMPRESSION: Nasogastric tube tip terminates below the level of the hemidiaphragms but the tip is not included in the field of view. Abdominal radiograph obtained and will be dictated separately.   Electronically Signed   By: Conchita Paris M.D.   On: 11/30/2014 07:18   Dg Abd 1 View  11/30/2014   CLINICAL DATA:  Nasogastric tube placement.  EXAM: ABDOMEN - 1 VIEW  COMPARISON:  Abdominal CT from yesterday  FINDINGS: Chest x-ray is reported separately.  Nasogastric tube tip in the stomach with coil in the fundus.  Gas distended colon in the pelvis. Previously administered oral contrast is in the rectum. No developments since yesterday's abdominal CT. Surgical clips from cholecystectomy and bowel surgery. Urinary excretion of IV contrast.  IMPRESSION: 1. Nasogastric tube tip at the gastric body. 2. Oral contrast from prior CT has reached the rectum.   Electronically Signed   By: Monte Fantasia M.D.   On: 11/30/2014 08:45   Ct Abdomen Pelvis W Contrast  11/29/2014   CLINICAL DATA:  Abdominal pain and nausea. Patient was seen at [facility] today. Patient was told that he had bowel obstruction and was sent to the hospital.  EXAM: CT ABDOMEN AND PELVIS WITH CONTRAST  TECHNIQUE: Multidetector CT imaging of the abdomen and pelvis was performed using the standard protocol following bolus administration of intravenous contrast.  CONTRAST:  199m OMNIPAQUE IOHEXOL 300 MG/ML SOLN, 260mOMNIPAQUE IOHEXOL 300 MG/ML SOLN  COMPARISON:  02/18/2014  FINDINGS: Scarring in the lung bases.  Partial gastric resection. There is mild distention of the stomach and proximal small bowel with some fluid-filled small bowel loops and air-fluid levels. This appearance is similar to the prior study and may represent a chronic condition. Postoperative changes with resection of much of the colon and anastomosis at the sigmoid region. There is a dilated  loop of colon at the level of the anastomosis. This is unchanged since prior study and is likely postoperative. Can't exclude bowel wall thickening in the left upper quadrant loops although this may just be due to under distention.  Surgical absence of the gallbladder. Mild bile duct dilatation is consistent with post cholecystectomy physiology. Liver, spleen, pancreas, adrenal glands, abdominal aorta, inferior vena cava, and retroperitoneal lymph nodes are unremarkable. Cysts in the kidneys, largest in the right kidney measures 4.4 cm. No hydronephrosis or hydroureter. Nonobstructing stone in the upper pole of both kidneys. Mild parenchymal scarring in the kidneys. No free fluid or free air in the abdomen.  Pelvis: Prostate gland is enlarged. Bladder wall is not thickened. No free or loculated pelvic fluid collections. No pelvic mass or lymphadenopathy. Degenerative changes in the spine and hips. No destructive bone lesions.  IMPRESSION: Postoperative changes with partial colectomy and anastomosis in the sigmoid region. Dilated loop of colon proximal to the anastomosis. Mild dilated stomach and proximal small bowel. Appearance is similar to prior study and may represent a chronic condition versus recurrent small bowel obstruction. There is evidence of wall thickening in some of the left upper quadrant jejunum although this may partially be due to under distention. Anatomy is significantly altered due to postoperative changes. Prostate gland is enlarged. Nonobstructing stones in both kidneys.   Electronically Signed   By: WiLucienne Capers.D.   On: 11/29/2014 21:46    ROS: See chart Blood pressure 116/81, pulse 87, temperature 98 F (36.7 C), temperature source Oral, resp. rate 20, height _0  (1.753 m), weight 65.772 kg (145 lb), SpO2 100 %. Physical Exam: Pleasant white male in no acute distress. Abdomen is soft with minimal bowel sounds appreciated. No hernias or rigidity are noted. No significant  tenderness is noted.  Assessment/Plan: Impression: Chronic bowel dysfunction, improved since yesterday. Plan: We will remove gastric tube. We'll start clear liquid diet. Patient usually is in tune to whether his diet can be advanced. We'll follow with you.  Aurelie Dicenzo A 11/30/2014, 10:23 AM

## 2014-11-30 NOTE — H&P (Signed)
[facility] History and Physical  [name]:[id number] DOB: 09/21/1950 DOA: 11/29/2014  Referring physician: Dr. Thurnell Garbe. PCP: [name], MD  Specialists: Dr. Sydell Axon. Gastroenterologist.                      Dr. Arnoldo Morale. Surgeon.  Chief Complaint: Abdominal pain with nausea.  HPI: [name] is a 64 y.o. 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 Patient's abdominal pain is mostly in the left upper quadrant consistent with nausea but denies any vomiting. Patient had only gone to urgent care center and was referred to the ER after patient's x-ray showed possible which is concerning for bowel obstruction. In the ER CT abdomen and pelvis was done which shows features concerning for bowel obstruction and on-call surgeon Dr. Arnoldo Morale was consulted by the ER physician. At this time Dr. Arnoldo Morale is advised NG tube placement and admission for small bowel obstruction. Patient states he has had no bowel movement yesterday morning at 2 AM. He has not had any flatus. Denies any chest pain or shortness of breath.   Review of Systems: As presented in the history of presenting illness, rest negative.  Past Medical History  Diagnosis Date  . Pulmonary embolism 02/2010  . Hypertension   . Osteoporosis   . Arthritis   . TIA (transient ischemic attack) 10/11  . ETOH abuse     quit 03/2010  . 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 Personal history of PE (pulmonary embolism) 10/01/2010  . Blood transfusion   . S/P endoscopy September 28, 2010    erosive reflux esophagitis, Billroth I anatomy  . Shortness of breath   . Sleep apnea   . Recurrent upper respiratory infection (URI)   . Anxiety   . Pneumonia   . Anemia   . Ileus   . Chronic abdominal pain   . GERD (gastroesophageal reflux disease)   . Chest pain at rest   . Erosive esophagitis   . Vitamin  B12 deficiency   . Bowel obstruction 05/13/2012    Recurrent  . Hx of Clostridium difficile infection 01/2012  . Bronchitis   . 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 Obstruction of bowel 03/03/14   Past Surgical History  Procedure Laterality Date  . Hernia repair      right inguinal  . Appendectomy  1980s  . Cholecystectomy  1980s  . Portacath placement    . Abdominal sugery      for bowel obstruction x 8, all in 1980s, except for one in 07/2010  . Esophagogastroduodenoscopy  09/28/2010  . Esophagogastroduodenoscopy  12/01/2010    Cervical web status post dilation, erosive esophagitis, B1 hemigastrectomy, inflamed anastomosis  . Colonoscopy  03/18/2011    anastomosis at 35cm. Several adenomatous polyps removed. Sigmoid diverticulosis. Next TCS 02/2013  . Esophagogastroduodenoscopy  04/16/2011    excoriation at GEJ c/w trauma/M-W tear, friable gastric anastomosis, dilation efferent limb  . Billroth 1 hemigastrectomy  1980s    per patient for benign duodenal tumor  . Esophagogastroduodenoscopy (egd) with esophageal dilation  02/25/2012    IDP:OEUMPNTI esophageal web-s/p dilation anddisruption as described above. Status post prior gastric with Billroth I configuration. Abnormal gastric mucosa at the anastomosis. Gastric biopsy showed mild chronic inflammation but no H. pylori   . Colonoscopy N/A 07/24/2012    RWE:RXVQMG post segmental resection  with normal-appearing colonic anastomosis aside from an adjacent polyp-removed as described above. Rectal polyp-removed as described above. CT findings appear to have been artifactual. tubular adenomas/prolapsed type polyp.  . Colon surgery  May 2012    left hemicolectomy, colon cancer found at time of surgery for bowel obstruction  . Cardiac catheterization  07/17/2012  . Abdominal adhesion surgery  03/04/15    @ [facility]  . Esophagogastroduodenoscopy N/A 06/03/2014    Procedure: ESOPHAGOGASTRODUODENOSCOPY (EGD);  Surgeon:  [name], MD;  Location: [facility];  Service: Endoscopy;  Laterality: N/A;  2:45  . Savory dilation N/A 06/03/2014    Procedure: SAVORY DILATION;  Surgeon: [name], MD;  Location: [facility];  Service: Endoscopy;  Laterality: N/A;  [name] dilation N/A 06/03/2014    Procedure: [name] DILATION;  Surgeon: [name], MD;  Location: [facility];  Service: Endoscopy;  Laterality: N/A;   Social History:  reports that he quit smoking about 1 years ago. His smoking use included Cigarettes. He has a 20 pack-year smoking history. He has never used smokeless tobacco. He reports that he does not drink alcohol or use illicit drugs. Where does patient live home. Can patient participate in ADLs? Yes.  No Known Allergies  Family History:  Family History  Problem Relation Age of Onset  . Hypertension Mother   . Arthritis Mother   . Pneumonia Mother   . Hypertension Father   . Heart attack Father       Prior to Admission medications   Medication Sig Start Date End Date Taking? Authorizing Provider  atorvastatin (LIPITOR) 20 MG tablet Take 20 mg by mouth daily.   Yes Historical Provider, MD  cholecalciferol (VITAMIN D) 1000 UNITS tablet Take 1,000 Units by mouth every morning.   Yes Historical Provider, MD  dexlansoprazole (DEXILANT) 60 MG capsule Take 1 capsule (60 mg total) by mouth every morning. 06/11/14  Yes Orvil Feil, NP  docusate sodium (COLACE) 100 MG capsule Take 100 mg by mouth 2 (two) times daily.   Yes Historical Provider, MD  enoxaparin (LOVENOX) 60 MG/0.6ML injection Inject 60 mg into the skin every evening.    Yes Historical Provider, MD  ENSURE PLUS (ENSURE PLUS) LIQD Take 237 mLs by mouth daily. Every other day   Yes Historical Provider, MD  ferrous [id number] C-folic acid (FEROCON) capsule Take 1 capsule by mouth daily.   Yes Historical Provider, MD  ferrous sulfate 325 (65 FE) MG tablet Take 325 mg by mouth daily with breakfast.   Yes Historical  Provider, MD  folic acid (FOLVITE) 1 MG tablet Take 1 mg by mouth every morning.    Yes Historical Provider, MD  lidocaine-prilocaine (EMLA) cream Apply 1 application topically as needed. 03/29/14  Yes Patrici Ranks, MD  LORazepam (ATIVAN) 1 MG tablet Take 1 mg by mouth every 4 (four) hours as needed. anxiety   Yes Historical Provider, MD  Melatonin 10 MG CAPS Take 10 mg by mouth at bedtime.   Yes Historical Provider, MD  Multiple Vitamins-Minerals (MULTIVITAMINS THER. W/MINERALS) TABS tablet Take 1 tablet by mouth daily.   Yes Historical Provider, MD  Probiotic Product (PROBIOTIC DAILY) CAPS Take 1 capsule by mouth daily.   Yes Historical Provider, MD  psyllium (REGULOID) 0.52 G capsule Take 5 capsules by mouth 2 (two) times daily.    Yes Historical Provider, MD  sertraline (ZOLOFT) 100 MG tablet Take 150 mg by mouth at bedtime.    Yes Historical Provider,  MD  thiamine 100 MG tablet Take 100 mg by mouth every morning.    Yes Historical Provider, MD  valsartan-hydrochlorothiazide (DIOVAN-HCT) 80-12.5 MG per tablet Take 1 tablet by mouth every morning.    Yes Historical Provider, MD  Wheat Dextrin (BENEFIBER PO) Take 1 each by mouth daily as needed (to regulate bowel movements/for supplement).    Yes Historical Provider, MD  albuterol (PROAIR HFA) 108 (90 BASE) MCG/ACT inhaler Inhale into the lungs every 6 (six) hours as needed for wheezing or shortness of breath.    Historical Provider, MD  alendronate (FOSAMAX) 70 MG tablet Take 70 mg by mouth every 7 (seven) days. On sundays 10/14/11   Historical Provider, MD  cyanocobalamin (,VITAMIN B-12,) 1000 MCG/ML injection Inject 1,000 mcg into the muscle every 30 (thirty) days.    Historical Provider, MD  cyclobenzaprine (FLEXERIL) 10 MG tablet Take 10 mg by mouth daily as needed for muscle spasms.  11/18/11   Historical Provider, MD  ondansetron (ZOFRAN) 4 MG tablet Take 1 tablet (4 mg total) by mouth every 8 (eight) hours as needed for nausea. 02/22/14    Aviva Signs, MD  polyethylene glycol powder (GLYCOLAX/MIRALAX) powder Take 17 g by mouth as needed. For constipation    Historical Provider, MD  prochlorperazine (COMPAZINE) 10 MG tablet Take 10 mg by mouth every 6 (six) hours as needed. Nausea and Vomiting    Historical Provider, MD  sucralfate (CARAFATE) 1 G tablet Take 1 g by mouth 4 (four) times daily.    Historical Provider, MD  sucralfate (CARAFATE) 1 GM/10ML suspension Take 10 mLs (1 g total) by mouth 4 (four) times daily. As needed 05/01/13   Mahala Menghini, PA-C  traMADol (ULTRAM) 50 MG tablet Take 50 mg by mouth every 6 (six) hours as needed for moderate pain or severe pain.     Historical Provider, MD  Vitamin D, Ergocalciferol, (DRISDOL) 50000 UNITS CAPS Take 50,000 Units by mouth once a week. Takes on Sunday 03/13/12   Historical Provider, MD    Physical Exam: Filed Vitals:   11/29/14 2153 11/29/14 2200 11/29/14 2230 11/29/14 2310  BP: 159/97 148/99 138/114 166/95  Pulse: 79 89 84 85  Temp: 97.6 F (36.4 C)   98 F (36.7 C)  TempSrc: Oral   Oral  Resp: 18   18  Height:    '5\' 9"'$  (1.753 m)  Weight:      SpO2: 100% 100% 99% 100%     General:  Moderately built and nourished.  Eyes: Anicteric no pallor.  ENT: No discharge from the ears eyes nose or mouth.  Neck: No mass felt.  Cardiovascular: S1-S2 heard.  Respiratory: No rhonchi or crepitations.  Abdomen: Mild tenderness in the left upper quadrant. No guarding or rigidity. Bowel sounds not appreciated.  Skin: No rash.  Musculoskeletal: No edema.  Psychiatric: Appears normal.  Neurologic: Alert awake oriented to time place and person. Moves all extremities.  Labs on Admission:  Basic Metabolic Panel:  Recent Labs Lab 11/29/14 1856  NA 141  K 4.6  CL 106  CO2 26  GLUCOSE 93  BUN 13  CREATININE 1.32*  CALCIUM 10.0   Liver Function Tests:  Recent Labs Lab 11/29/14 1856  AST 20  ALT 15*  ALKPHOS 71  BILITOT 0.4  PROT 7.5  ALBUMIN 4.3     Recent Labs Lab 11/29/14 1856  LIPASE 23   No results for input(s): AMMONIA in the last 168 hours. CBC:  Recent Labs Lab 11/29/14  1856  WBC 12.8*  HGB 14.8  HCT 45.1  MCV 87.9  PLT 233   Cardiac Enzymes: No results for input(s): CKTOTAL, CKMB, CKMBINDEX, TROPONINI in the last 168 hours.  BNP (last 3 results) No results for input(s): BNP in the last 8760 hours.  ProBNP (last 3 results) No results for input(s): PROBNP in the last 8760 hours.  CBG: No results for input(s): GLUCAP in the last 168 hours.  Radiological Exams on Admission: Ct Abdomen Pelvis W Contrast  11/29/2014   CLINICAL DATA:  Abdominal pain and nausea. Patient was seen at [facility] today. Patient was told that he had bowel obstruction and was sent to the hospital.  EXAM: CT ABDOMEN AND PELVIS WITH CONTRAST  TECHNIQUE: Multidetector CT imaging of the abdomen and pelvis was performed using the standard protocol following bolus administration of intravenous contrast.  CONTRAST:  140m OMNIPAQUE IOHEXOL 300 MG/ML SOLN, 264mOMNIPAQUE IOHEXOL 300 MG/ML SOLN  COMPARISON:  02/18/2014  FINDINGS: Scarring in the lung bases.  Partial gastric resection. There is mild distention of the stomach and proximal small bowel with some fluid-filled small bowel loops and air-fluid levels. This appearance is similar to the prior study and may represent a chronic condition. Postoperative changes with resection of much of the colon and anastomosis at the sigmoid region. There is a dilated loop of colon at the level of the anastomosis. This is unchanged since prior study and is likely postoperative. Can't exclude bowel wall thickening in the left upper quadrant loops although this may just be due to under distention.  Surgical absence of the gallbladder. Mild bile duct dilatation is consistent with post cholecystectomy physiology. Liver, spleen, pancreas, adrenal glands, abdominal aorta, inferior vena cava, and retroperitoneal lymph nodes  are unremarkable. Cysts in the kidneys, largest in the right kidney measures 4.4 cm. No hydronephrosis or hydroureter. Nonobstructing stone in the upper pole of both kidneys. Mild parenchymal scarring in the kidneys. No free fluid or free air in the abdomen.  Pelvis: Prostate gland is enlarged. Bladder wall is not thickened. No free or loculated pelvic fluid collections. No pelvic mass or lymphadenopathy. Degenerative changes in the spine and hips. No destructive bone lesions.  IMPRESSION: Postoperative changes with partial colectomy and anastomosis in the sigmoid region. Dilated loop of colon proximal to the anastomosis. Mild dilated stomach and proximal small bowel. Appearance is similar to prior study and may represent a chronic condition versus recurrent small bowel obstruction. There is evidence of wall thickening in some of the left upper quadrant jejunum although this may partially be due to under distention. Anatomy is significantly altered due to postoperative changes. Prostate gland is enlarged. Nonobstructing stones in both kidneys.   Electronically Signed   By: WiLucienne Capers.D.   On: 11/29/2014 21:46     Assessment/Plan Principal Problem:   SBO (small bowel obstruction) Active Problems:   Personal history of PE (pulmonary embolism)   Essential hypertension   1. Small bowel obstruction - at this time patient has been placed on NG tube and nothing by mouth. Continue with gentle hydration. Pain relief medication. Further recommendations per surgery. We'll get a KUB in a.m. 2. History of recurrent PE on Lovenox which will be continued. 3. Hypertension - since patient is nothing by mouth I have placed patient on when necessary IV hydralazine. 4. Acute renal failure - probably from dehydration continue with hydration and closely follow metabolic panel. 5. History of colon cancer status post surgery and chemotherapy. 6. Hyperlipidemia -  continue statins when patient can take orally.  I  have reviewed patient's old charts of labs.   DVT Prophylaxis the patient is on Lovenox for history of PE.  Code Status: Full code.  Family Communication: Discussed with patient.  Disposition Plan: Admit to inpatient.    Pax Reasoner N. [facility] Pager [phone].  If 7PM-7AM, please contact night-coverage [email or web address] Password [email or web address] 11/30/2014, 1:00 AM

## 2014-12-01 DIAGNOSIS — E785 Hyperlipidemia, unspecified: Secondary | ICD-10-CM

## 2014-12-01 LAB — COMPREHENSIVE METABOLIC PANEL
ALK PHOS: 84 U/L (ref 38–126)
ALT: 52 U/L (ref 17–63)
AST: 100 U/L — AB (ref 15–41)
Albumin: 3.3 g/dL — ABNORMAL LOW (ref 3.5–5.0)
Anion gap: 5 (ref 5–15)
BUN: 8 mg/dL (ref 6–20)
CALCIUM: 7.8 mg/dL — AB (ref 8.9–10.3)
CHLORIDE: 105 mmol/L (ref 101–111)
CO2: 26 mmol/L (ref 22–32)
CREATININE: 1.02 mg/dL (ref 0.61–1.24)
GFR calc Af Amer: >60 mL/min (ref >60)
GFR calc non Af Amer: >60 mL/min (ref >60)
Glucose, Bld: 112 mg/dL — ABNORMAL HIGH (ref 65–99)
Potassium: 4.2 mmol/L (ref 3.5–5.1)
SODIUM: 136 mmol/L (ref 135–145)
Total Bilirubin: 0.4 mg/dL (ref 0.3–1.2)
Total Protein: 5.8 g/dL — ABNORMAL LOW (ref 6.5–8.1)

## 2014-12-01 LAB — GLUCOSE, CAPILLARY
GLUCOSE-CAPILLARY: 99 mg/dL (ref 65–99)
GLUCOSE-CAPILLARY: 104 mg/dL — AB (ref 65–99)
Glucose-Capillary: 96 mg/dL (ref 65–99)
Glucose-Capillary: 124 mg/dL — ABNORMAL HIGH (ref 65–99)

## 2014-12-01 LAB — CBC
HCT: 37.8 % — ABNORMAL LOW (ref 39.0–52.0)
HEMOGLOBIN: 12.4 g/dL — AB (ref 13.0–17.0)
MCH: 29.1 pg (ref 26.0–34.0)
MCHC: 32.8 g/dL (ref 30.0–36.0)
MCV: 88.7 fL (ref 78.0–100.0)
PLATELETS: 172 10*3/uL (ref 150–400)
RBC: 4.26 MIL/uL (ref 4.22–5.81)
RDW: 15.4 % (ref 11.5–15.5)
WBC: 8.1 10*3/uL (ref 4.0–10.5)

## 2014-12-01 MED ORDER — POLYETHYLENE GLYCOL 3350 17 G PO PACK
17.0000 g | PACK | Freq: Two times a day (BID) | ORAL | Status: DC
  Administered 2014-12-01 – 2014-12-03 (×5): 17 g via ORAL
  Filled 2014-12-01 (×5): qty 1

## 2014-12-01 MED ORDER — LORAZEPAM 1 MG PO TABS: 1.0000 mg | ORAL_TABLET | ORAL | Status: DC | PRN

## 2014-12-01 MED ORDER — SUCRALFATE 1 G PO TABS
1.0000 g | ORAL_TABLET | Freq: Four times a day (QID) | ORAL | Status: DC
  Administered 2014-12-01 – 2014-12-03 (×8): 1 g via ORAL
  Filled 2014-12-01 (×8): qty 1

## 2014-12-01 MED ORDER — VALSARTAN-HYDROCHLOROTHIAZIDE 80-12.5 MG PO TABS: 1.0000 | ORAL_TABLET | Freq: Every morning | ORAL | Status: DC

## 2014-12-01 MED ORDER — HYDROCHLOROTHIAZIDE 12.5 MG PO CAPS
12.5000 mg | ORAL_CAPSULE | Freq: Every day | ORAL | Status: DC
  Administered 2014-12-02 – 2014-12-03 (×2): 12.5 mg via ORAL
  Filled 2014-12-01 (×2): qty 1

## 2014-12-01 MED ORDER — TRAMADOL HCL 50 MG PO TABS: 50.0000 mg | ORAL_TABLET | Freq: Four times a day (QID) | ORAL | Status: DC | PRN

## 2014-12-01 MED ORDER — PANTOPRAZOLE SODIUM 40 MG PO TBEC
40.0000 mg | DELAYED_RELEASE_TABLET | Freq: Every day | ORAL | Status: DC
  Administered 2014-12-01 – 2014-12-03 (×2): 40 mg via ORAL
  Filled 2014-12-01 (×2): qty 1

## 2014-12-01 MED ORDER — ATORVASTATIN CALCIUM 20 MG PO TABS
20.0000 mg | ORAL_TABLET | Freq: Every day | ORAL | Status: DC
  Administered 2014-12-01 – 2014-12-03 (×3): 20 mg via ORAL
  Filled 2014-12-01 (×3): qty 1

## 2014-12-01 MED ORDER — IRBESARTAN 75 MG PO TABS
75.0000 mg | ORAL_TABLET | Freq: Every day | ORAL | Status: DC
  Administered 2014-12-02 – 2014-12-03 (×2): 75 mg via ORAL
  Filled 2014-12-01 (×2): qty 1

## 2014-12-01 NOTE — Progress Notes (Signed)
Subjective: Having some nausea this morning with upper abdominal fullness. Has had 2 bowel movements.  Objective: Vital signs in last 24 hours: Temp:  [97.6 F (36.4 C)-98 F (36.7 C)] 97.6 F (36.4 C) (09/11 0610) Pulse Rate:  [57-94] 57 (09/11 0610) Resp:  [20] 20 (09/11 0610) BP: (114-129)/(82-83) 129/83 mmHg (09/11 0610) SpO2:  [96 %-99 %] 99 % (09/11 0610) Weight:  [64.411 kg (142 lb)] 64.411 kg (142 lb) (09/11 0610) Last BM Date: 11/30/14  Intake/Output from previous day: 09/10 0701 - 09/11 0700 In: 940.8 [I.V.:940.8] Out: -  Intake/Output this shift:    General appearance: alert, cooperative and no distress GI: Soft, flat. Occasional bowel sounds appreciated. No rigidity noted.  Lab Results:   Recent Labs  11/30/14 0608 12/01/14 0557  WBC 11.4* 8.1  HGB 13.1 12.4*  HCT 39.5 37.8*  PLT 183 172   BMET  Recent Labs  11/30/14 0608 12/01/14 0555  NA 139 136  K 3.9 4.2  CL 108 105  CO2 25 26  GLUCOSE 105* 112*  BUN 12 8  CREATININE 1.07 1.02  CALCIUM 8.2* 7.8*   PT/INR No results for input(s): LABPROT, INR in the last 72 hours.  Studies/Results: Dg Chest 1 View  11/30/2014   CLINICAL DATA:  NG tube placement  EXAM: CHEST  1 VIEW  COMPARISON:  08/15/2014  FINDINGS: Left-sided Port-A-Cath noted with tip over the upper SVC. The aorta is unfolded and ectatic. Heart size is normal. Nasogastric tube tip terminates below the level of the hemidiaphragms but is not included in the field of view. Lungs are clear.  IMPRESSION: Nasogastric tube tip terminates below the level of the hemidiaphragms but the tip is not included in the field of view. Abdominal radiograph obtained and will be dictated separately.   Electronically Signed   By: Conchita Paris M.D.   On: 11/30/2014 07:18   Dg Abd 1 View  11/30/2014   CLINICAL DATA:  Nasogastric tube placement.  EXAM: ABDOMEN - 1 VIEW  COMPARISON:  Abdominal CT from yesterday  FINDINGS: Chest x-ray is reported separately.   Nasogastric tube tip in the stomach with coil in the fundus.  Gas distended colon in the pelvis. Previously administered oral contrast is in the rectum. No developments since yesterday's abdominal CT. Surgical clips from cholecystectomy and bowel surgery. Urinary excretion of IV contrast.  IMPRESSION: 1. Nasogastric tube tip at the gastric body. 2. Oral contrast from prior CT has reached the rectum.   Electronically Signed   By: Monte Fantasia M.D.   On: 11/30/2014 08:45   Ct Abdomen Pelvis W Contrast  11/29/2014   CLINICAL DATA:  Abdominal pain and nausea. Patient was seen at [facility] today. Patient was told that he had bowel obstruction and was sent to the hospital.  EXAM: CT ABDOMEN AND PELVIS WITH CONTRAST  TECHNIQUE: Multidetector CT imaging of the abdomen and pelvis was performed using the standard protocol following bolus administration of intravenous contrast.  CONTRAST:  175m OMNIPAQUE IOHEXOL 300 MG/ML SOLN, 264mOMNIPAQUE IOHEXOL 300 MG/ML SOLN  COMPARISON:  02/18/2014  FINDINGS: Scarring in the lung bases.  Partial gastric resection. There is mild distention of the stomach and proximal small bowel with some fluid-filled small bowel loops and air-fluid levels. This appearance is similar to the prior study and may represent a chronic condition. Postoperative changes with resection of much of the colon and anastomosis at the sigmoid region. There is a dilated loop of colon at the level of the  anastomosis. This is unchanged since prior study and is likely postoperative. Can't exclude bowel wall thickening in the left upper quadrant loops although this may just be due to under distention.  Surgical absence of the gallbladder. Mild bile duct dilatation is consistent with post cholecystectomy physiology. Liver, spleen, pancreas, adrenal glands, abdominal aorta, inferior vena cava, and retroperitoneal lymph nodes are unremarkable. Cysts in the kidneys, largest in the right kidney measures 4.4 cm. No  hydronephrosis or hydroureter. Nonobstructing stone in the upper pole of both kidneys. Mild parenchymal scarring in the kidneys. No free fluid or free air in the abdomen.  Pelvis: Prostate gland is enlarged. Bladder wall is not thickened. No free or loculated pelvic fluid collections. No pelvic mass or lymphadenopathy. Degenerative changes in the spine and hips. No destructive bone lesions.  IMPRESSION: Postoperative changes with partial colectomy and anastomosis in the sigmoid region. Dilated loop of colon proximal to the anastomosis. Mild dilated stomach and proximal small bowel. Appearance is similar to prior study and may represent a chronic condition versus recurrent small bowel obstruction. There is evidence of wall thickening in some of the left upper quadrant jejunum although this may partially be due to under distention. Anatomy is significantly altered due to postoperative changes. Prostate gland is enlarged. Nonobstructing stones in both kidneys.   Electronically Signed   By: Lucienne Capers M.D.   On: 11/29/2014 21:46    Anti-infectives: Anti-infectives    None      Assessment/Plan: Impression: Bowel dysfunction, history of gastroparesis, intestinal atony Plan: Will add Muralax to his regimen. Continue soft diet as patient can tolerate.  LOS: 1 day    Kaydn Kumpf A 12/01/2014

## 2014-12-01 NOTE — Progress Notes (Signed)
Utilization review Completed Henrik Orihuela RN BSN   

## 2014-12-01 NOTE — Progress Notes (Signed)
[facility] PROGRESS NOTE  KILLIAN SCHWER WJX:[id number] DOB: 1950/12/05 DOA: 11/29/2014 PCP: [name], MD  Assessment/Plan: 1. Small bowel obstruction. Clinically improving. NG tube has been removed and he appears to be tolerating clear liquids. We'll advance to full liquids today. General surgery following. 2. History of recurrent pulmonary embolus. Continue home Lovenox. 3. Hypertension. Resume ARB/hydrochlorothiazide. Blood pressure stable. 4. Acute renal failure. Likely related to dehydration. Resolved with IV hydration. 5. Hyperlipidemia. Continue statin. 6. History of colon cancer status post surgery at [facility], chemotherapy last 2012.  Code Status: full code Family Communication: no family present Disposition Plan: discharge home once improved   Consultants:  Gen Surgery  Procedures:    Antibiotics:    HPI/Subjective: Having bowel movements, no vomiting, still has nausea, abdominal discomfort improving  Objective: Filed Vitals:   12/01/14 0610  BP: 129/83  Pulse: 57  Temp: 97.6 F (36.4 C)  Resp: 20    Intake/Output Summary (Last 24 hours) at 12/01/14 1626 Last data filed at 11/30/14 1800  Gross per 24 hour  Intake 940.83 ml  Output      0 ml  Net 940.83 ml   Filed Weights   11/29/14 1830 12/01/14 0610  Weight: 65.772 kg (145 lb) 64.411 kg (142 lb)    Exam:   General:  NAD  Cardiovascular: S1, S2 RRR  Respiratory: CTA B  Abdomen: soft, nd, nt, bs+  Musculoskeletal: no edema b/l   Data Reviewed: Basic Metabolic Panel:  Recent Labs Lab 11/29/14 1856 11/30/14 0608 12/01/14 0555  NA 141 139 136  K 4.6 3.9 4.2  CL 106 108 105  CO2 '26 25 26  '$ GLUCOSE 93 105* 112*  BUN '13 12 8  '$ CREATININE 1.32* 1.07 1.02  CALCIUM 10.0 8.2* 7.8*   Liver Function Tests:  Recent Labs Lab 11/29/14 1856 11/30/14 0608 12/01/14 0555  AST 20 63* 100*  ALT 15* 28 52  ALKPHOS 71 68 84  BILITOT 0.4 0.5 0.4  PROT 7.5 6.2* 5.8*  ALBUMIN 4.3  3.6 3.3*    Recent Labs Lab 11/29/14 1856  LIPASE 23   No results for input(s): AMMONIA in the last 168 hours. CBC:  Recent Labs Lab 11/29/14 1856 11/30/14 0608 12/01/14 0557  WBC 12.8* 11.4* 8.1  NEUTROABS  --  6.1  --   HGB 14.8 13.1 12.4*  HCT 45.1 39.5 37.8*  MCV 87.9 88.0 88.7  PLT 233 183 172   Cardiac Enzymes: No results for input(s): CKTOTAL, CKMB, CKMBINDEX, TROPONINI in the last 168 hours. BNP (last 3 results) No results for input(s): BNP in the last 8760 hours.  ProBNP (last 3 results) No results for input(s): PROBNP in the last 8760 hours.  CBG:  Recent Labs Lab 11/30/14 1126 11/30/14 1815 11/30/14 2051 12/01/14 0609 12/01/14 1200  GLUCAP 80 111* 98 96 124*    No results found for this or any previous visit (from the past 240 hour(s)).   Studies: Dg Chest 1 View  11/30/2014   CLINICAL DATA:  NG tube placement  EXAM: CHEST  1 VIEW  COMPARISON:  08/15/2014  FINDINGS: Left-sided Port-A-Cath noted with tip over the upper SVC. The aorta is unfolded and ectatic. Heart size is normal. Nasogastric tube tip terminates below the level of the hemidiaphragms but is not included in the field of view. Lungs are clear.  IMPRESSION: Nasogastric tube tip terminates below the level of the hemidiaphragms but the tip is not included in the field of view. Abdominal radiograph obtained and will  be dictated separately.   Electronically Signed   By: Conchita Paris M.D.   On: 11/30/2014 07:18   Dg Abd 1 View  11/30/2014   CLINICAL DATA:  Nasogastric tube placement.  EXAM: ABDOMEN - 1 VIEW  COMPARISON:  Abdominal CT from yesterday  FINDINGS: Chest x-ray is reported separately.  Nasogastric tube tip in the stomach with coil in the fundus.  Gas distended colon in the pelvis. Previously administered oral contrast is in the rectum. No developments since yesterday's abdominal CT. Surgical clips from cholecystectomy and bowel surgery. Urinary excretion of IV contrast.  IMPRESSION: 1.  Nasogastric tube tip at the gastric body. 2. Oral contrast from prior CT has reached the rectum.   Electronically Signed   By: Monte Fantasia M.D.   On: 11/30/2014 08:45   Ct Abdomen Pelvis W Contrast  11/29/2014   CLINICAL DATA:  Abdominal pain and nausea. Patient was seen at [facility] today. Patient was told that he had bowel obstruction and was sent to the hospital.  EXAM: CT ABDOMEN AND PELVIS WITH CONTRAST  TECHNIQUE: Multidetector CT imaging of the abdomen and pelvis was performed using the standard protocol following bolus administration of intravenous contrast.  CONTRAST:  162m OMNIPAQUE IOHEXOL 300 MG/ML SOLN, 236mOMNIPAQUE IOHEXOL 300 MG/ML SOLN  COMPARISON:  02/18/2014  FINDINGS: Scarring in the lung bases.  Partial gastric resection. There is mild distention of the stomach and proximal small bowel with some fluid-filled small bowel loops and air-fluid levels. This appearance is similar to the prior study and may represent a chronic condition. Postoperative changes with resection of much of the colon and anastomosis at the sigmoid region. There is a dilated loop of colon at the level of the anastomosis. This is unchanged since prior study and is likely postoperative. Can't exclude bowel wall thickening in the left upper quadrant loops although this may just be due to under distention.  Surgical absence of the gallbladder. Mild bile duct dilatation is consistent with post cholecystectomy physiology. Liver, spleen, pancreas, adrenal glands, abdominal aorta, inferior vena cava, and retroperitoneal lymph nodes are unremarkable. Cysts in the kidneys, largest in the right kidney measures 4.4 cm. No hydronephrosis or hydroureter. Nonobstructing stone in the upper pole of both kidneys. Mild parenchymal scarring in the kidneys. No free fluid or free air in the abdomen.  Pelvis: Prostate gland is enlarged. Bladder wall is not thickened. No free or loculated pelvic fluid collections. No pelvic mass or  lymphadenopathy. Degenerative changes in the spine and hips. No destructive bone lesions.  IMPRESSION: Postoperative changes with partial colectomy and anastomosis in the sigmoid region. Dilated loop of colon proximal to the anastomosis. Mild dilated stomach and proximal small bowel. Appearance is similar to prior study and may represent a chronic condition versus recurrent small bowel obstruction. There is evidence of wall thickening in some of the left upper quadrant jejunum although this may partially be due to under distention. Anatomy is significantly altered due to postoperative changes. Prostate gland is enlarged. Nonobstructing stones in both kidneys.   Electronically Signed   By: WiLucienne Capers.D.   On: 11/29/2014 21:46    Scheduled Meds: . atorvastatin  20 mg Oral Daily  . enoxaparin  60 mg Subcutaneous QPM  . pantoprazole  40 mg Oral Daily  . pantoprazole (PROTONIX) IV  40 mg Intravenous Q12H  . polyethylene glycol  17 g Oral BID  . sucralfate  1 g Oral QID  . [START ON 12/02/2014] valsartan-hydrochlorothiazide  1 tablet  Oral q morning - 10a   Continuous Infusions:   Principal Problem:   SBO (small bowel obstruction) Active Problems:   Personal history of PE (pulmonary embolism)   Essential hypertension    Time spent: 34mns    Rossana Molchan  [facility] Pager [phone] If 7PM-7AM, please contact night-coverage at [email or web address], password [email or web address]9/01/2015, 4:26 PM  LOS: 1 day

## 2014-12-02 LAB — CBC
HEMATOCRIT: 37.6 % — AB (ref 39.0–52.0)
HEMOGLOBIN: 12.2 g/dL — AB (ref 13.0–17.0)
MCH: 28.8 pg (ref 26.0–34.0)
MCHC: 32.4 g/dL (ref 30.0–36.0)
MCV: 88.9 fL (ref 78.0–100.0)
PLATELETS: 158 10*3/uL (ref 150–400)
RBC: 4.23 MIL/uL (ref 4.22–5.81)
RDW: 14.8 % (ref 11.5–15.5)
WBC: 7 10*3/uL (ref 4.0–10.5)

## 2014-12-02 LAB — GLUCOSE, CAPILLARY
Glucose-Capillary: 107 mg/dL — ABNORMAL HIGH (ref 65–99)
Glucose-Capillary: 91 mg/dL (ref 65–99)

## 2014-12-02 MED ORDER — METOCLOPRAMIDE HCL 5 MG/ML IJ SOLN
10.0000 mg | Freq: Four times a day (QID) | INTRAMUSCULAR | Status: DC
  Administered 2014-12-02 – 2014-12-03 (×4): 10 mg via INTRAVENOUS
  Filled 2014-12-02 (×4): qty 2

## 2014-12-02 NOTE — Progress Notes (Signed)
  Subjective: States he was having dry heaves yesterday. He did have a small bowel movement yesterday. States he feels about the same as he did yesterday.  Objective: Vital signs in last 24 hours: Temp:  [97.8 F (36.6 C)] 97.8 F (36.6 C) (09/11 2008) Pulse Rate:  [80-93] 93 (09/12 0540) Resp:  [20] 20 (09/12 0540) BP: (142-150)/(76-88) 142/83 mmHg (09/12 0540) SpO2:  [98 %-100 %] 98 % (09/12 0540) Weight:  [63.685 kg (140 lb 6.4 oz)] 63.685 kg (140 lb 6.4 oz) (09/12 0618) Last BM Date: 12/01/14 (green liquid)  Intake/Output from previous day:   Intake/Output this shift: Total I/O In: 120 [P.O.:120] Out: -   General appearance: alert, cooperative and no distress GI: Soft, flat, no rigidity noted. Minimal bowel sounds appreciated.  Lab Results:   Recent Labs  12/01/14 0557 12/02/14 0602  WBC 8.1 7.0  HGB 12.4* 12.2*  HCT 37.8* 37.6*  PLT 172 158   BMET  Recent Labs  11/30/14 0608 12/01/14 0555  NA 139 136  K 3.9 4.2  CL 108 105  CO2 25 26  GLUCOSE 105* 112*  BUN 12 8  CREATININE 1.07 1.02  CALCIUM 8.2* 7.8*   PT/INR No results for input(s): LABPROT, INR in the last 72 hours.  Studies/Results: No results found.  Anti-infectives: Anti-infectives    None      Assessment/Plan: Impression: Intestinal atony, acute on chronic. Plan: We'll advance diet once patient is able to tolerate it. Continue current management.  LOS: 2 days    Rogelio Waynick A 12/02/2014

## 2014-12-02 NOTE — Progress Notes (Signed)
[facility] PROGRESS NOTE  [name] ZOX:[id number] DOB: 01-03-1951 DOA: 11/29/2014 PCP: [name], MD  Assessment/Plan: 1. Small bowel obstruction. Remains nauseous. NG tube has been removed and diet has been advance to full liquids. He continues to have nausea and drive heaves. Will give a trial of Reglan, advance diet as tolerated. General surgery following.  2. History of recurrent pulmonary embolus. Will continue home Lovenox. 3. Hypertension. BP 's are stable. Continue on ARB/hydrochlorothiazide. 4. Acute renal failure. Likely related to dehydration. Resolved with IV hydration. 5. Hyperlipidemia. Will continue statin. 6. History of colon cancer status post surgery at [facility], chemotherapy last 2012. 7. GERD. Continue PPI.  Code Status: full code DVT Prophylaxis Lovenox Family Communication: no family present Disposition Plan: discharge home within 1-2 days   Consultants:  Gen Surgery, Dr. Arnoldo Morale  Procedures:    Antibiotics:    HPI/Subjective: Feeling nauseous. Left sided abdominal pain. Small bowel movement last night. Passing gas.  Objective: Filed Vitals:   12/02/14 0540  BP: 142/83  Pulse: 93  Temp:   Resp: 20   No intake or output data in the 24 hours ending 12/02/14 0655 Filed Weights   11/29/14 1830 12/01/14 0610 12/02/14 0618  Weight: 65.772 kg (145 lb) 64.411 kg (142 lb) 63.685 kg (140 lb 6.4 oz)    Exam:   General:  NAD  Cardiovascular: S1, S2 regular rate and rhythm  Respiratory: Clear to auscultation bilaterally. No w/r/r.  Abdomen: soft, non distended, non tender. Positive bowel sounds  Musculoskeletal: no BLE edema   Data Reviewed: Basic Metabolic Panel:  Recent Labs Lab 11/29/14 1856 11/30/14 0608 12/01/14 0555  NA 141 139 136  K 4.6 3.9 4.2  CL 106 108 105  CO2 '26 25 26  '$ GLUCOSE 93 105* 112*  BUN '13 12 8  '$ CREATININE 1.32* 1.07 1.02  CALCIUM 10.0 8.2* 7.8*   Liver Function Tests:  Recent Labs Lab  11/29/14 1856 11/30/14 0608 12/01/14 0555  AST 20 63* 100*  ALT 15* 28 52  ALKPHOS 71 68 84  BILITOT 0.4 0.5 0.4  PROT 7.5 6.2* 5.8*  ALBUMIN 4.3 3.6 3.3*    Recent Labs Lab 11/29/14 1856  LIPASE 23    CBC:  Recent Labs Lab 11/29/14 1856 11/30/14 0608 12/01/14 0557  WBC 12.8* 11.4* 8.1  NEUTROABS  --  6.1  --   HGB 14.8 13.1 12.4*  HCT 45.1 39.5 37.8*  MCV 87.9 88.0 88.7  PLT 233 183 172   Cardiac Enzymes:  BNP (last 3 results)  ProBNP (last 3 results)  CBG:  Recent Labs Lab 11/30/14 2051 12/01/14 0609 12/01/14 1200 12/01/14 1836 12/01/14 2248  GLUCAP 98 96 124* 99 104*      Studies: Dg Chest 1 View  11/30/2014   CLINICAL DATA:  NG tube placement  EXAM: CHEST  1 VIEW  COMPARISON:  08/15/2014  FINDINGS: Left-sided Port-A-Cath noted with tip over the upper SVC. The aorta is unfolded and ectatic. Heart size is normal. Nasogastric tube tip terminates below the level of the hemidiaphragms but is not included in the field of view. Lungs are clear.  IMPRESSION: Nasogastric tube tip terminates below the level of the hemidiaphragms but the tip is not included in the field of view. Abdominal radiograph obtained and will be dictated separately.   Electronically Signed   By: Conchita Paris M.D.   On: 11/30/2014 07:18   Dg Abd 1 View  11/30/2014   CLINICAL DATA:  Nasogastric tube placement.  EXAM:  ABDOMEN - 1 VIEW  COMPARISON:  Abdominal CT from yesterday  FINDINGS: Chest x-ray is reported separately.  Nasogastric tube tip in the stomach with coil in the fundus.  Gas distended colon in the pelvis. Previously administered oral contrast is in the rectum. No developments since yesterday's abdominal CT. Surgical clips from cholecystectomy and bowel surgery. Urinary excretion of IV contrast.  IMPRESSION: 1. Nasogastric tube tip at the gastric body. 2. Oral contrast from prior CT has reached the rectum.   Electronically Signed   By: Monte Fantasia M.D.   On: 11/30/2014 08:45     Scheduled Meds: . atorvastatin  20 mg Oral Daily  . enoxaparin  60 mg Subcutaneous QPM  . irbesartan  75 mg Oral Daily   And  . hydrochlorothiazide  12.5 mg Oral Daily  . pantoprazole  40 mg Oral Daily  . pantoprazole (PROTONIX) IV  40 mg Intravenous Q12H  . polyethylene glycol  17 g Oral BID  . sucralfate  1 g Oral QID   Continuous Infusions:   Principal Problem:   SBO (small bowel obstruction) Active Problems:   Personal history of PE (pulmonary embolism)   Essential hypertension    Time spent: 25 minutes  By signing my name below, I, Rhett Bannister attest that this documentation has been prepared under the direction and in the presence of Dr. Kathie Dike, M.D.   Electronically signed: Rhett Bannister  12/02/2014   Kathie Dike, M.D.  [facility] Pager [phone]. If 7PM-7AM, please contact night-coverage at [email or web address], password [facility] 12/02/2014, 6:55 AM  LOS: 2 days    I have reviewed the above documentation for accuracy and completeness, and I agree with the above.  Zaheer Wageman

## 2014-12-02 NOTE — Progress Notes (Signed)
Pt CBG 91.

## 2014-12-03 LAB — BASIC METABOLIC PANEL
Anion gap: 8 (ref 5–15)
BUN: 6 mg/dL (ref 6–20)
CALCIUM: 8.2 mg/dL — AB (ref 8.9–10.3)
CO2: 28 mmol/L (ref 22–32)
CREATININE: 0.85 mg/dL (ref 0.61–1.24)
Chloride: 101 mmol/L (ref 101–111)
GFR calc Af Amer: >60 mL/min (ref >60)
GFR calc non Af Amer: >60 mL/min (ref >60)
GLUCOSE: 95 mg/dL (ref 65–99)
Potassium: 3.7 mmol/L (ref 3.5–5.1)
Sodium: 137 mmol/L (ref 135–145)

## 2014-12-03 LAB — GLUCOSE, CAPILLARY
GLUCOSE-CAPILLARY: 99 mg/dL (ref 65–99)
Glucose-Capillary: 120 mg/dL — ABNORMAL HIGH (ref 65–99)

## 2014-12-03 MED ORDER — HEPARIN SOD (PORK) LOCK FLUSH 100 UNIT/ML IV SOLN
500.0000 [IU] | INTRAVENOUS | Status: AC | PRN
  Administered 2014-12-03: 500 [IU]
  Filled 2014-12-03: qty 5

## 2014-12-03 MED ORDER — METOCLOPRAMIDE HCL 10 MG PO TABS: 10.0000 mg | ORAL_TABLET | Freq: Four times a day (QID) | ORAL | Status: DC | PRN

## 2014-12-03 MED ORDER — ONDANSETRON HCL 4 MG PO TABS
4.0000 mg | ORAL_TABLET | ORAL | Status: AC
  Administered 2014-12-03: 4 mg via ORAL
  Filled 2014-12-03: qty 1

## 2014-12-03 MED ORDER — METOCLOPRAMIDE HCL 10 MG PO TABS
10.0000 mg | ORAL_TABLET | ORAL | Status: AC
  Administered 2014-12-03: 10 mg via ORAL
  Filled 2014-12-03: qty 1

## 2014-12-03 NOTE — Progress Notes (Signed)
  Subjective: Patient's nausea has resolved.  Objective: Vital signs in last 24 hours: Temp:  [97.7 F (36.5 C)-98.3 F (36.8 C)] 98.3 F (36.8 C) (09/13 0406) Pulse Rate:  [84-94] 84 (09/13 0406) Resp:  [20] 20 (09/13 0406) BP: (124-143)/(80-94) 143/94 mmHg (09/13 0406) SpO2:  [94 %-99 %] 97 % (09/13 0406) Weight:  [62.687 kg (138 lb 3.2 oz)] 62.687 kg (138 lb 3.2 oz) (09/13 0406) Last BM Date: 12/02/14  Intake/Output from previous day: 09/12 0701 - 09/13 0700 In: 600 [P.O.:600] Out: 450 [Urine:450] Intake/Output this shift:    General appearance: alert, cooperative and no distress GI: soft, non-tender; bowel sounds normal; no masses,  no organomegaly  Lab Results:   Recent Labs  12/01/14 0557 12/02/14 0602  WBC 8.1 7.0  HGB 12.4* 12.2*  HCT 37.8* 37.6*  PLT 172 158   BMET  Recent Labs  12/01/14 0555 12/03/14 0559  NA 136 137  K 4.2 3.7  CL 105 101  CO2 26 28  GLUCOSE 112* 95  BUN 8 6  CREATININE 1.02 0.85  CALCIUM 7.8* 8.2*   PT/INR No results for input(s): LABPROT, INR in the last 72 hours.  Studies/Results: No results found.  Anti-infectives: Anti-infectives    None      Assessment/Plan: Impression: Bowel dysfunction, resolved. Patient is back at his baseline state. Plan: Okay for discharge from surgery standpoint.  LOS: 3 days    Kristal Perl A 12/03/2014

## 2014-12-03 NOTE — Discharge Summary (Signed)
Physician Discharge Summary  [name] XKG:[id number] DOB: 1950/10/04 DOA: 11/29/2014  PCP: [name], MD  Admit date: 11/29/2014 Discharge date: 12/03/2014  Time spent: 25 minutes  Recommendations for Outpatient Follow-up:  1. 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 In the ER CT abdomen and pelvis was concerning for bowel obstruction and on-call surgeon Dr. Arnoldo Morale was consulted by the ER physician. Dr. Arnoldo Morale is advised NG tube placement and admission for small bowel obstruction.   Hospital Course:  Upon admission he was believed to have small bowel obstruction. NG tube placed for decompression. His abdominal pain improved and he was able to have bowel movements. NG Tube was removed and his diet was advanced and he is tolerating full liquids at this time. Nausea was resolved with Reglan. General surgery evaluated and believed there was no need for surgical interventions. Pt feels he is ready to discharge home.   History of recurrent pulmonary embolus.Continue home Lovenox.  Hypertension. BP 's are stable. Continue on ARB/hydrochlorothiazide.  Acute renal failure. Likely related to dehydration. Resolved with IV hydration.  Hyperlipidemia.Continue statin.  History of colon cancer status post surgery at [facility], chemotherapy last 2012.  GERD. Continue PPI.  Procedures:  None    Consultations:  General surgery   Discharge Exam: Filed Vitals:   12/03/14 0406  BP: 143/94  Pulse: 84  Temp: 98.3 F (36.8 C)  Resp: 20    General: NAD, looks comfortable  Cardiovascular: RRR, S1, S2   Respiratory: clear bilaterally, No wheezing, rales or rhnochi  Abdomen: soft, non tender, no distention , bowel sounds normal  Musculoskeletal: No edema b/l  Discharge Instructions   Discharge Instructions    Diet - low sodium heart healthy    Complete by:  As directed      Increase activity slowly    Complete by:  As directed           Current Discharge Medication List    START taking these medications   Details  metoCLOPramide (REGLAN) 10 MG tablet Take 1 tablet (10 mg total) by mouth every 6 (six) hours as needed for nausea. Qty: 90 tablet, Refills: 0      CONTINUE these medications which have NOT CHANGED   Details  atorvastatin (LIPITOR) 20 MG tablet Take 20 mg by mouth daily.    cholecalciferol (VITAMIN D) 1000 UNITS tablet Take 1,000 Units by mouth every morning.    dexlansoprazole (DEXILANT) 60 MG capsule Take 1 capsule (60 mg total) by mouth every morning. Qty: 30 capsule, Refills: 5    docusate sodium (COLACE) 100 MG capsule Take 100 mg by mouth 2 (two) times daily.    enoxaparin (LOVENOX) 60 MG/0.6ML injection Inject 60 mg into the skin every evening.     ENSURE PLUS (ENSURE PLUS) LIQD Take 237 mLs by mouth daily. Every other day    ferrous [id number] C-folic acid (FEROCON)  capsule Take 1 capsule by mouth daily.   Associated Diagnoses: Adenocarcinoma of colon; Small bowel obstruction; Poor venous access    ferrous sulfate 325 (65 FE) MG tablet Take 325 mg by mouth daily with breakfast.    folic acid (FOLVITE) 1 MG tablet Take 1 mg by mouth every morning.     lidocaine-prilocaine (EMLA) cream Apply 1 application topically as needed. Qty: 30 g, Refills: 3    LORazepam (ATIVAN) 1 MG tablet Take 1 mg by mouth every 4 (four) hours  as needed. anxiety    Melatonin 10 MG CAPS Take 10 mg by mouth at bedtime.   Associated Diagnoses: Adenocarcinoma of colon    Multiple Vitamins-Minerals (MULTIVITAMINS THER. W/MINERALS) TABS tablet Take 1 tablet by mouth daily.    Probiotic Product (PROBIOTIC DAILY) CAPS Take 1 capsule by mouth daily.    psyllium (REGULOID) 0.52 G capsule Take 5 capsules by mouth 2 (two) times daily.     sertraline (ZOLOFT) 100 MG tablet Take 150 mg by mouth at bedtime.     thiamine 100 MG tablet Take 100 mg by mouth every morning.     valsartan-hydrochlorothiazide (DIOVAN-HCT) 80-12.5 MG per tablet Take 1 tablet by mouth every morning.     Wheat Dextrin (BENEFIBER PO) Take 1 each by mouth daily as needed (to regulate bowel movements/for supplement).     albuterol (PROAIR HFA) 108 (90 BASE) MCG/ACT inhaler Inhale into the lungs every 6 (six) hours as needed for wheezing or shortness of breath.   Associated Diagnoses: Adenocarcinoma of colon; Small bowel obstruction; Poor venous access    alendronate (FOSAMAX) 70 MG tablet Take 70 mg by mouth every 7 (seven) days. On sundays    cyanocobalamin (,VITAMIN B-12,) 1000 MCG/ML injection Inject 1,000 mcg into the muscle every 30 (thirty) days.    cyclobenzaprine (FLEXERIL) 10 MG tablet Take 10 mg by mouth daily as needed for muscle spasms.    Associated Diagnoses: Adenocarcinoma of colon    ondansetron (ZOFRAN) 4 MG tablet Take 1 tablet (4 mg total) by mouth every 8 (eight) hours as needed for nausea. Qty: 20 tablet, Refills: 1    polyethylene glycol powder (GLYCOLAX/MIRALAX) powder Take 17 g by mouth as needed. For constipation    prochlorperazine (COMPAZINE) 10 MG tablet Take 10 mg by mouth every 6 (six) hours as needed. Nausea and Vomiting    sucralfate (CARAFATE) 1 G tablet Take 1 g by mouth 4 (four) times daily.    sucralfate (CARAFATE) 1 GM/10ML suspension Take 10 mLs (1 g total) by mouth 4 (four) times daily. As needed Qty: 420 mL, Refills: 1     traMADol (ULTRAM) 50 MG tablet Take 50 mg by mouth every 6 (six) hours as needed for moderate pain or severe pain.     Vitamin D, Ergocalciferol, (DRISDOL) 50000 UNITS CAPS Take 50,000 Units by mouth once a week. Takes on Sunday   Associated Diagnoses: Adenocarcinoma of colon       No Known Allergies Follow-up Information    Follow up with CAPLAN,Aquan, MD. Schedule an appointment as soon as possible for a visit in 2 weeks.   Specialty:  Internal Medicine   Contact information:   [place] [address] [id number] [phone]        The results of significant diagnostics from this hospitalization (including imaging, microbiology, ancillary and laboratory) are listed below for reference.    Significant Diagnostic Studies: Dg Chest 1 View  11/30/2014   CLINICAL DATA:  NG  tube placement  EXAM: CHEST  1 VIEW  COMPARISON:  08/15/2014  FINDINGS: Left-sided Port-A-Cath noted with tip over the upper SVC. The aorta is unfolded and ectatic. Heart size is normal. Nasogastric tube tip terminates below the level of the hemidiaphragms but is not included in the field of view. Lungs are clear.  IMPRESSION: Nasogastric tube tip terminates below the level of the hemidiaphragms but the tip is not included in the field of view. Abdominal radiograph obtained and will be dictated separately.   Electronically Signed   By: Conchita Paris M.D.   On: 11/30/2014 07:18   Dg Abd 1 View  11/30/2014   CLINICAL DATA:  Nasogastric tube placement.  EXAM: ABDOMEN - 1 VIEW  COMPARISON:  Abdominal CT from yesterday  FINDINGS: Chest x-ray is reported separately.  Nasogastric tube tip in the stomach with coil in the fundus.  Gas distended colon in the pelvis. Previously administered oral contrast is in the rectum. No developments since yesterday's abdominal CT. Surgical clips from cholecystectomy and bowel surgery. Urinary excretion of IV contrast.  IMPRESSION: 1. Nasogastric tube tip at the gastric body. 2. Oral  contrast from prior CT has reached the rectum.   Electronically Signed   By: Monte Fantasia M.D.   On: 11/30/2014 08:45   Ct Abdomen Pelvis W Contrast  11/29/2014   CLINICAL DATA:  Abdominal pain and nausea. Patient was seen at [facility] today. Patient was told that he had bowel obstruction and was sent to the hospital.  EXAM: CT ABDOMEN AND PELVIS WITH CONTRAST  TECHNIQUE: Multidetector CT imaging of the abdomen and pelvis was performed using the standard protocol following bolus administration of intravenous contrast.  CONTRAST:  128m OMNIPAQUE IOHEXOL 300 MG/ML SOLN, 274mOMNIPAQUE IOHEXOL 300 MG/ML SOLN  COMPARISON:  02/18/2014  FINDINGS: Scarring in the lung bases.  Partial gastric resection. There is mild distention of the stomach and proximal small bowel with some fluid-filled small bowel loops and air-fluid levels. This appearance is similar to the prior study and may represent a chronic condition. Postoperative changes with resection of much of the colon and anastomosis at the sigmoid region. There is a dilated loop of colon at the level of the anastomosis. This is unchanged since prior study and is likely postoperative. Can't exclude bowel wall thickening in the left upper quadrant loops although this may just be due to under distention.  Surgical absence of the gallbladder. Mild bile duct dilatation is consistent with post cholecystectomy physiology. Liver, spleen, pancreas, adrenal glands, abdominal aorta, inferior vena cava, and retroperitoneal lymph nodes are unremarkable. Cysts in the kidneys, largest in the right kidney measures 4.4 cm. No hydronephrosis or hydroureter. Nonobstructing stone in the upper pole of both kidneys. Mild parenchymal scarring in the kidneys. No free fluid or free air in the abdomen.  Pelvis: Prostate gland is enlarged. Bladder wall is not thickened. No free or loculated pelvic fluid collections. No pelvic mass or lymphadenopathy. Degenerative changes in the spine and  hips. No destructive bone lesions.  IMPRESSION: Postoperative changes with partial colectomy and anastomosis in the sigmoid region. Dilated loop of colon proximal to the anastomosis. Mild dilated stomach and proximal small bowel. Appearance is similar to prior study and may represent a chronic condition versus recurrent small bowel obstruction. There is evidence of wall thickening in some of the left upper quadrant jejunum although this may partially be due to under distention. Anatomy is significantly altered due to postoperative changes. Prostate gland is enlarged. Nonobstructing stones  in both kidneys.   Electronically Signed   By: Lucienne Capers M.D.   On: 11/29/2014 21:46    Microbiology: No results found for this or any previous visit (from the past 240 hour(s)).   Labs: Basic Metabolic Panel:  Recent Labs Lab 11/29/14 1856 11/30/14 0608 12/01/14 0555 12/03/14 0559  NA 141 139 136 137  K 4.6 3.9 4.2 3.7  CL 106 108 105 101  CO2 '26 25 26 28  '$ GLUCOSE 93 105* 112* 95  BUN '13 12 8 6  '$ CREATININE 1.32* 1.07 1.02 0.85  CALCIUM 10.0 8.2* 7.8* 8.2*   Liver Function Tests:  Recent Labs Lab 11/29/14 1856 11/30/14 0608 12/01/14 0555  AST 20 63* 100*  ALT 15* 28 52  ALKPHOS 71 68 84  BILITOT 0.4 0.5 0.4  PROT 7.5 6.2* 5.8*  ALBUMIN 4.3 3.6 3.3*    Recent Labs Lab 11/29/14 1856  LIPASE 23   No results for input(s): AMMONIA in the last 168 hours. CBC:  Recent Labs Lab 11/29/14 1856 11/30/14 0608 12/01/14 0557 12/02/14 0602  WBC 12.8* 11.4* 8.1 7.0  NEUTROABS  --  6.1  --   --   HGB 14.8 13.1 12.4* 12.2*  HCT 45.1 39.5 37.8* 37.6*  MCV 87.9 88.0 88.7 88.9  PLT 233 183 172 158    CBG:  Recent Labs Lab 12/01/14 2248 12/02/14 1114 12/02/14 1633 12/03/14 0016 12/03/14 0404  GLUCAP 104* 107* 91 120* 99       Signed:  Kathie Dike, MD  [facility] 12/03/2014, 9:09 AM    By signing my name below, I, Rennis Harding, attest that this  documentation has been prepared under the direction and in the presence of Kathie Dike, MD. Electronically signed: Rennis Harding, Scribe. 12/03/2014  I have reviewed the above documentation for accuracy and completeness, and I agree with the above.  Summerlyn Fickel

## 2014-12-03 NOTE — Care Management Note (Signed)
Case Management Note  Patient Details  Name: MAXXON SCHWANKE MRN: [id number] Date of Birth: 1951-02-18   Expected Discharge Date:  12/03/14               Expected Discharge Plan:  Home/Self Care  In-House Referral:  NA  Discharge planning Services  CM Consult  Post Acute Care Choice:  NA Choice offered to:  NA  DME Arranged:    DME Agency:     HH Arranged:    [place] Agency:     Status of Service:  Completed, signed off  Medicare Important Message Given:    Date Medicare IM Given:    Medicare IM give by:    Date Additional Medicare IM Given:    Additional Medicare Important Message give by:     If discussed at [place] of Stay Meetings, dates discussed:    Additional Comments: Pt from home, ind at baseline. Pt admitted with SBO. Pt has no HH services or DME needs prior to admission. Pt plans to discharge home today with self care pending relief from symptoms. No CM needs noted.  Sherald Barge, RN 12/03/2014, 1:05 PM

## 2014-12-03 NOTE — Progress Notes (Signed)
Patient reported feeling "some better." Dr. Roderic Palau notified, patient d/c home in stable condition, out via w/c with staff.

## 2014-12-03 NOTE — Progress Notes (Signed)
Upon NT entering room to wheel patient out for discharge, patient reported "not feeling well."  VS taken-T 97.8, P106, R20, BP139/92, 100% sat on RA.  Upon RN assessment, patient reported being "nauseated, stomach hurting, dizzy." No acute distress noted, patient appears calm. Dr. Roderic Palau notified, orders received for reglan and zofran, will monitor patient after administration of medications for relief of symptoms.

## 2014-12-06 ENCOUNTER — Encounter (HOSPITAL_COMMUNITY): Payer: Medicare Other

## 2015-01-03 ENCOUNTER — Encounter (HOSPITAL_COMMUNITY): Payer: Federal, State, Local not specified - PPO

## 2015-01-10 ENCOUNTER — Inpatient Hospital Stay (HOSPITAL_COMMUNITY)
Admission: EM | Admit: 2015-01-10 | Discharge: 2015-01-13 | DRG: 389 | Disposition: A | Discharge status: Home / Self Care | Payer: Medicare Other | Attending: Internal Medicine | Admitting: Internal Medicine

## 2015-01-10 ENCOUNTER — Encounter (HOSPITAL_COMMUNITY): Payer: Federal, State, Local not specified - PPO | Attending: Hematology and Oncology

## 2015-01-10 ENCOUNTER — Encounter (HOSPITAL_COMMUNITY): Payer: Self-pay

## 2015-01-10 ENCOUNTER — Encounter (HOSPITAL_COMMUNITY): Payer: Self-pay | Admitting: *Deleted

## 2015-01-10 ENCOUNTER — Emergency Department (HOSPITAL_COMMUNITY): Payer: Medicare Other

## 2015-01-10 VITALS — BP 128/78 | HR 100 | Temp 98.2°F | Resp 18

## 2015-01-10 DIAGNOSIS — M1990 Unspecified osteoarthritis, unspecified site: Secondary | ICD-10-CM | POA: Diagnosis present

## 2015-01-10 DIAGNOSIS — C189 Malignant neoplasm of colon, unspecified: Secondary | ICD-10-CM | POA: Insufficient documentation

## 2015-01-10 DIAGNOSIS — K2210 Ulcer of esophagus without bleeding: Secondary | ICD-10-CM | POA: Diagnosis present

## 2015-01-10 DIAGNOSIS — M810 Age-related osteoporosis without current pathological fracture: Secondary | ICD-10-CM | POA: Diagnosis present

## 2015-01-10 DIAGNOSIS — K5669 Other intestinal obstruction: Secondary | ICD-10-CM

## 2015-01-10 DIAGNOSIS — Z7901 Long term (current) use of anticoagulants: Secondary | ICD-10-CM

## 2015-01-10 DIAGNOSIS — Z452 Encounter for adjustment and management of vascular access device: Secondary | ICD-10-CM

## 2015-01-10 DIAGNOSIS — K219 Gastro-esophageal reflux disease without esophagitis: Secondary | ICD-10-CM | POA: Diagnosis present

## 2015-01-10 DIAGNOSIS — Z9049 Acquired absence of other specified parts of digestive tract: Secondary | ICD-10-CM | POA: Diagnosis not present

## 2015-01-10 DIAGNOSIS — I10 Essential (primary) hypertension: Secondary | ICD-10-CM

## 2015-01-10 DIAGNOSIS — Z903 Acquired absence of stomach [part of]: Secondary | ICD-10-CM

## 2015-01-10 DIAGNOSIS — Z8673 Personal history of transient ischemic attack (TIA), and cerebral infarction without residual deficits: Secondary | ICD-10-CM | POA: Diagnosis not present

## 2015-01-10 DIAGNOSIS — Z86711 Personal history of pulmonary embolism: Secondary | ICD-10-CM

## 2015-01-10 DIAGNOSIS — Z95828 Presence of other vascular implants and grafts: Secondary | ICD-10-CM

## 2015-01-10 DIAGNOSIS — Z87891 Personal history of nicotine dependence: Secondary | ICD-10-CM | POA: Diagnosis not present

## 2015-01-10 DIAGNOSIS — Z85038 Personal history of other malignant neoplasm of large intestine: Secondary | ICD-10-CM

## 2015-01-10 DIAGNOSIS — R740 Nonspecific elevation of levels of transaminase and lactic acid dehydrogenase [LDH]: Secondary | ICD-10-CM

## 2015-01-10 DIAGNOSIS — K56609 Unspecified intestinal obstruction, unspecified as to partial versus complete obstruction: Secondary | ICD-10-CM

## 2015-01-10 DIAGNOSIS — Z8249 Family history of ischemic heart disease and other diseases of the circulatory system: Secondary | ICD-10-CM | POA: Diagnosis not present

## 2015-01-10 DIAGNOSIS — K529 Noninfective gastroenteritis and colitis, unspecified: Secondary | ICD-10-CM | POA: Diagnosis present

## 2015-01-10 DIAGNOSIS — K208 Other esophagitis: Secondary | ICD-10-CM | POA: Diagnosis not present

## 2015-01-10 DIAGNOSIS — K56600 Partial intestinal obstruction, unspecified as to cause: Secondary | ICD-10-CM | POA: Diagnosis present

## 2015-01-10 DIAGNOSIS — R112 Nausea with vomiting, unspecified: Secondary | ICD-10-CM | POA: Diagnosis present

## 2015-01-10 DIAGNOSIS — R7401 Elevation of levels of liver transaminase levels: Secondary | ICD-10-CM

## 2015-01-10 LAB — COMPREHENSIVE METABOLIC PANEL
ALBUMIN: 3.6 g/dL (ref 3.5–5.0)
ALK PHOS: 146 U/L — AB (ref 38–126)
ALT: 593 U/L — ABNORMAL HIGH (ref 17–63)
AST: 1462 U/L — AB (ref 15–41)
Anion gap: 9 (ref 5–15)
BILIRUBIN TOTAL: 0.7 mg/dL (ref 0.3–1.2)
BUN: 22 mg/dL — AB (ref 6–20)
CALCIUM: 8.1 mg/dL — AB (ref 8.9–10.3)
CO2: 26 mmol/L (ref 22–32)
Chloride: 104 mmol/L (ref 101–111)
Creatinine, Ser: 1.18 mg/dL (ref 0.61–1.24)
GFR calc Af Amer: >60 mL/min (ref >60)
GFR calc non Af Amer: >60 mL/min (ref >60)
GLUCOSE: 88 mg/dL (ref 65–99)
POTASSIUM: 4 mmol/L (ref 3.5–5.1)
Sodium: 139 mmol/L (ref 135–145)
TOTAL PROTEIN: 6.2 g/dL — AB (ref 6.5–8.1)

## 2015-01-10 LAB — CBC WITH DIFFERENTIAL/PLATELET
BASOS ABS: 0 10*3/uL (ref 0.0–0.1)
BASOS PCT: 1 %
EOS ABS: 0.1 10*3/uL (ref 0.0–0.7)
Eosinophils Relative: 1 %
HCT: 38.2 % — ABNORMAL LOW (ref 39.0–52.0)
Hemoglobin: 12.7 g/dL — ABNORMAL LOW (ref 13.0–17.0)
Lymphocytes Relative: 27 %
Lymphs Abs: 2.1 10*3/uL (ref 0.7–4.0)
MCH: 29.3 pg (ref 26.0–34.0)
MCHC: 33.2 g/dL (ref 30.0–36.0)
MCV: 88.2 fL (ref 78.0–100.0)
MONO ABS: 0.9 10*3/uL (ref 0.1–1.0)
MONOS PCT: 12 %
Neutro Abs: 4.7 10*3/uL (ref 1.7–7.7)
Neutrophils Relative %: 60 %
PLATELETS: 192 10*3/uL (ref 150–400)
RBC: 4.33 MIL/uL (ref 4.22–5.81)
RDW: 17 % — AB (ref 11.5–15.5)
WBC: 7.9 10*3/uL (ref 4.0–10.5)

## 2015-01-10 LAB — LIPASE, BLOOD: LIPASE: 32 U/L (ref 11–51)

## 2015-01-10 MED ORDER — CIPROFLOXACIN IN D5W 400 MG/200ML IV SOLN
400.0000 mg | Freq: Two times a day (BID) | INTRAVENOUS | Status: DC
Start: 2015-01-10 — End: 2015-01-11
  Administered 2015-01-10 – 2015-01-11 (×2): 400 mg via INTRAVENOUS
  Filled 2015-01-10 (×2): qty 200

## 2015-01-10 MED ORDER — ONDANSETRON HCL 4 MG/2ML IJ SOLN
4.0000 mg | Freq: Four times a day (QID) | INTRAMUSCULAR | Status: DC | PRN
  Administered 2015-01-10 – 2015-01-13 (×8): 4 mg via INTRAVENOUS
  Filled 2015-01-10 (×8): qty 2

## 2015-01-10 MED ORDER — IOHEXOL 300 MG/ML  SOLN
100.0000 mL | Freq: Once | INTRAMUSCULAR | Status: AC | PRN
  Administered 2015-01-10: 100 mL via INTRAVENOUS

## 2015-01-10 MED ORDER — FENTANYL CITRATE (PF) 100 MCG/2ML IJ SOLN
50.0000 ug | Freq: Once | INTRAMUSCULAR | Status: AC
  Administered 2015-01-10: 50 ug via INTRAVENOUS
  Filled 2015-01-10: qty 2

## 2015-01-10 MED ORDER — ENOXAPARIN SODIUM 60 MG/0.6ML ~~LOC~~ SOLN
60.0000 mg | SUBCUTANEOUS | Status: DC
  Administered 2015-01-10 – 2015-01-12 (×3): 60 mg via SUBCUTANEOUS
  Filled 2015-01-10 (×3): qty 0.6

## 2015-01-10 MED ORDER — ONDANSETRON HCL 4 MG PO TABS: 4.0000 mg | ORAL_TABLET | Freq: Four times a day (QID) | ORAL | Status: DC | PRN

## 2015-01-10 MED ORDER — MORPHINE SULFATE (PF) 4 MG/ML IV SOLN
4.0000 mg | INTRAVENOUS | Status: DC | PRN
  Administered 2015-01-10 – 2015-01-13 (×18): 4 mg via INTRAVENOUS
  Filled 2015-01-10 (×18): qty 1

## 2015-01-10 MED ORDER — ONDANSETRON HCL 4 MG/2ML IJ SOLN
4.0000 mg | Freq: Once | INTRAMUSCULAR | Status: AC
  Administered 2015-01-10: 4 mg via INTRAVENOUS
  Filled 2015-01-10: qty 2

## 2015-01-10 MED ORDER — METRONIDAZOLE IN NACL 5-0.79 MG/ML-% IV SOLN
500.0000 mg | Freq: Three times a day (TID) | INTRAVENOUS | Status: DC
  Administered 2015-01-10 – 2015-01-11 (×2): 500 mg via INTRAVENOUS
  Filled 2015-01-10 (×2): qty 100

## 2015-01-10 MED ORDER — SODIUM CHLORIDE 0.9 % IV SOLN: INTRAVENOUS | Status: DC

## 2015-01-10 MED ORDER — HEPARIN SOD (PORK) LOCK FLUSH 100 UNIT/ML IV SOLN
500.0000 [IU] | Freq: Once | INTRAVENOUS | Status: AC
  Administered 2015-01-10: 500 [IU] via INTRAVENOUS

## 2015-01-10 MED ORDER — SODIUM CHLORIDE 0.9 % IJ SOLN
10.0000 mL | INTRAMUSCULAR | Status: DC | PRN
  Administered 2015-01-10: 10 mL via INTRAVENOUS
  Filled 2015-01-10: qty 10

## 2015-01-10 MED ORDER — HYDRALAZINE HCL 20 MG/ML IJ SOLN: 5.0000 mg | Freq: Four times a day (QID) | INTRAMUSCULAR | Status: DC | PRN

## 2015-01-10 MED ORDER — SODIUM CHLORIDE 0.9 % IV BOLUS (SEPSIS)
500.0000 mL | Freq: Once | INTRAVENOUS | Status: AC
  Administered 2015-01-10: 500 mL via INTRAVENOUS

## 2015-01-10 MED ORDER — SODIUM CHLORIDE 0.9 % IV SOLN
INTRAVENOUS | Status: DC
  Administered 2015-01-10 – 2015-01-13 (×5): via INTRAVENOUS

## 2015-01-10 NOTE — H&P (Addendum)
[facility] History and Physical  [name] ZSW:[id number] DOB: 07/25/1950 DOA: 01/10/2015  Referring physician: ER PCP: [name], MD   Chief Complaint: Abdominal pain, nausea and vomiting  HPI: [name] is a 64 y.o. male  This is a 64 year old man who has history of colon cancer and is currently followed by oncology undergoing chemotherapy, he is status post surgery. He now presents with recurrent symptoms of abdominal pain, mostly on the left side associated with nausea and vomiting. He has had no fever or dyspnea. He has passed gas but no stool. The patient has had admissions in the past for small bowel obstructions. Evaluation emergency room showed CT scan findings of distended small bowel loops in the right abdomen with air-fluid levels, suspicious for possible small bowel obstruction but also some thickening in the small bowel in the left abdomen suspicious for enteritis. He is now being admitted for further management.   Review of Systems:  Apart from symptoms above, all systems are negative.  Past Medical History  Diagnosis Date  . Pulmonary embolism ([place]) 02/2010  . Hypertension   . Osteoporosis   . Arthritis   . TIA (transient ischemic attack) 10/11  . ETOH abuse     quit 03/2010  . 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 Personal history of PE (pulmonary embolism) 10/01/2010  . Blood transfusion   . S/P endoscopy September 28, 2010    erosive reflux esophagitis, Billroth I anatomy  . Shortness of breath   . Sleep apnea   . Recurrent upper respiratory infection (URI)   . Anxiety   . Pneumonia   . Anemia   . Ileus ([place])   . Chronic abdominal pain   . GERD (gastroesophageal reflux disease)   . Chest pain at rest   . Erosive esophagitis   . Vitamin B12 deficiency   . Bowel obstruction ([place]) 05/13/2012    Recurrent  . Hx of Clostridium difficile infection 01/2012  . Bronchitis   . 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  Obstruction of bowel ([place]) 03/03/14   Past Surgical History  Procedure Laterality Date  . Hernia repair      right inguinal  . Appendectomy  1980s  . Cholecystectomy  1980s  . Portacath placement    . Abdominal sugery      for bowel obstruction x 8, all in 1980s, except for one in 07/2010  . Esophagogastroduodenoscopy  09/28/2010  . Esophagogastroduodenoscopy  12/01/2010    Cervical web status post dilation, erosive esophagitis, B1 hemigastrectomy, inflamed anastomosis  . Colonoscopy  03/18/2011    anastomosis at 35cm. Several adenomatous polyps removed. Sigmoid diverticulosis. Next TCS 02/2013  . Esophagogastroduodenoscopy  04/16/2011    excoriation at GEJ c/w trauma/M-W tear, friable gastric anastomosis, dilation efferent limb  . Billroth 1 hemigastrectomy  1980s    per patient for benign duodenal tumor  . Esophagogastroduodenoscopy (egd) with esophageal dilation  02/25/2012    DUK:GURKYHCW esophageal web-s/p dilation anddisruption as described above. Status post prior gastric with Billroth I configuration. Abnormal gastric mucosa at the anastomosis. Gastric biopsy showed mild chronic inflammation but no H. pylori   . Colonoscopy N/A 07/24/2012    CBJ:SEGBTD post segmental resection with normal-appearing colonic anastomosis aside from an adjacent polyp-removed as described above. Rectal polyp-removed as described above. CT findings appear to have been artifactual. tubular adenomas/prolapsed type polyp.  . Colon surgery  May 2012    left hemicolectomy, colon cancer found at  time of surgery for bowel obstruction  . Cardiac catheterization  07/17/2012  . Abdominal adhesion surgery  03/04/15    @ [facility]  . Esophagogastroduodenoscopy N/A 06/03/2014    Procedure: ESOPHAGOGASTRODUODENOSCOPY (EGD);  Surgeon: [name], MD;  Location: [facility];  Service: Endoscopy;  Laterality: N/A;  2:45  . Savory dilation N/A 06/03/2014    Procedure: SAVORY DILATION;  Surgeon: [name], MD;  Location:  [facility];  Service: Endoscopy;  Laterality: N/A;  [name] dilation N/A 06/03/2014    Procedure: [name] DILATION;  Surgeon: [name], MD;  Location: [facility];  Service: Endoscopy;  Laterality: N/A;   Social History:  reports that he quit smoking about 2 years ago. His smoking use included Cigarettes. He has a 20 pack-year smoking history. He has never used smokeless tobacco. He reports that he does not drink alcohol or use illicit drugs.  No Known Allergies  Family History  Problem Relation Age of Onset  . Hypertension Mother   . Arthritis Mother   . Pneumonia Mother   . Hypertension Father   . Heart attack Father     Prior to Admission medications   Medication Sig Start Date End Date Taking? Authorizing Provider  albuterol (PROAIR HFA) 108 (90 BASE) MCG/ACT inhaler Inhale into the lungs every 6 (six) hours as needed for wheezing or shortness of breath.   Yes Historical Provider, MD  alendronate (FOSAMAX) 70 MG tablet Take 70 mg by mouth every 7 (seven) days. On sundays 10/14/11  Yes Historical Provider, MD  atorvastatin (LIPITOR) 20 MG tablet Take 20 mg by mouth daily.   Yes Historical Provider, MD  cholecalciferol (VITAMIN D) 1000 UNITS tablet Take 1,000 Units by mouth every morning.   Yes Historical Provider, MD  cyanocobalamin (,VITAMIN B-12,) 1000 MCG/ML injection Inject 1,000 mcg into the muscle every 30 (thirty) days.   Yes Historical Provider, MD  cyclobenzaprine (FLEXERIL) 10 MG tablet Take 10 mg by mouth daily as needed for muscle spasms.  11/18/11  Yes Historical Provider, MD  dexlansoprazole (DEXILANT) 60 MG capsule Take 1 capsule (60 mg total) by mouth every morning. 06/11/14  Yes Orvil Feil, NP  docusate sodium (COLACE) 100 MG capsule Take 100 mg by mouth 2 (two) times daily.   Yes Historical Provider, MD  enoxaparin (LOVENOX) 60 MG/0.6ML injection Inject 60 mg into the skin every evening.    Yes Historical Provider, MD  ENSURE PLUS (ENSURE PLUS) LIQD Take 237  mLs by mouth daily. Every other day   Yes Historical Provider, MD  ferrous [id number] C-folic acid (FEROCON) capsule Take 1 capsule by mouth daily.   Yes Historical Provider, MD  ferrous sulfate 325 (65 FE) MG tablet Take 325 mg by mouth daily with breakfast.   Yes Historical Provider, MD  folic acid (FOLVITE) 1 MG tablet Take 1 mg by mouth every morning.    Yes Historical Provider, MD  lidocaine-prilocaine (EMLA) cream Apply 1 application topically as needed. Patient taking differently: Apply 1 application topically as needed (for port access).  03/29/14  Yes Patrici Ranks, MD  LORazepam (ATIVAN) 1 MG tablet Take 1 mg by mouth every 4 (four) hours as needed. anxiety   Yes Historical Provider, MD  Melatonin 10 MG CAPS Take 10 mg by mouth at bedtime as needed (for sleep).    Yes Historical Provider, MD  Multiple Vitamins-Minerals (MULTIVITAMINS THER. W/MINERALS) TABS tablet Take 1 tablet by mouth daily.   Yes Historical Provider, MD  ondansetron (ZOFRAN) 4 MG tablet Take 1 tablet (4 mg total) by mouth every 8 (eight) hours as needed for nausea. 02/22/14  Yes Aviva Signs, MD  polyethylene glycol powder (GLYCOLAX/MIRALAX) powder Take 17 g by mouth as needed. For constipation   Yes Historical Provider, MD  Probiotic Product (PROBIOTIC DAILY) CAPS Take 1 capsule by mouth daily.   Yes Historical Provider, MD  prochlorperazine (COMPAZINE) 10 MG tablet Take 10 mg by mouth every 6 (six) hours as needed. Nausea and Vomiting   Yes Historical Provider, MD  psyllium (REGULOID) 0.52 G capsule Take 5 capsules by mouth 2 (two) times daily.    Yes Historical Provider, MD  sertraline (ZOLOFT) 100 MG tablet Take 150 mg by mouth at bedtime.    Yes Historical Provider, MD  sucralfate (CARAFATE) 1 G tablet Take 1 g by mouth 4 (four) times daily.   Yes Historical Provider, MD  thiamine 100 MG tablet Take 100 mg by mouth every morning.    Yes Historical Provider, MD  traMADol (ULTRAM) 50 MG tablet Take 50 mg by  mouth every 6 (six) hours as needed for moderate pain or severe pain.    Yes Historical Provider, MD  valsartan-hydrochlorothiazide (DIOVAN-HCT) 80-12.5 MG per tablet Take 1 tablet by mouth every morning.    Yes Historical Provider, MD  Vitamin D, Ergocalciferol, (DRISDOL) 50000 UNITS CAPS Take 50,000 Units by mouth once a week. Takes on Sunday 03/13/12  Yes Historical Provider, MD  Wheat Dextrin (BENEFIBER PO) Take 1 each by mouth daily as needed (to regulate bowel movements/for supplement).    Yes Historical Provider, MD  metoCLOPramide (REGLAN) 10 MG tablet Take 1 tablet (10 mg total) by mouth every 6 (six) hours as needed for nausea. Patient not taking: Reported on 01/10/2015 12/03/14   Kathie Dike, MD   Physical Exam: Filed Vitals:   01/10/15 1245 01/10/15 1435 01/10/15 1645 01/10/15 1839  BP: 142/84 139/92 127/91 136/93  Pulse: 106 91 91 81  Temp: 98.1 F (36.7 C)     TempSrc: Oral     Resp: '16 16 18 18  '$ Height: '5\' 9"'$  (1.753 m)   '5\' 9"'$  (1.753 m)  Weight: 65.318 kg (144 lb)   66.407 kg (146 lb 6.4 oz)  SpO2: 99% 98% 99% 98%    Wt Readings from Last 3 Encounters:  01/10/15 66.407 kg (146 lb 6.4 oz)  12/03/14 62.687 kg (138 lb 3.2 oz)  09/13/14 64.955 kg (143 lb 3.2 oz)    General:  Appears calm and comfortable. He does not appear to be in pain at rest. He is not toxic or septic. Eyes: PERRL, normal lids, irises & conjunctiva ENT: grossly normal hearing, lips & tongue Neck: no LAD, masses or thyromegaly Cardiovascular: RRR, no m/r/g. No LE edema. Telemetry: SR, no arrhythmias  Respiratory: CTA bilaterally, no w/r/r. Normal respiratory effort. Abdomen: soft, tender in the left abdomen but no evidence of an acute abdomen. Bowel sounds are heard but not tinkling. Skin: no rash or induration seen on limited exam Musculoskeletal: grossly normal tone BUE/BLE Psychiatric: grossly normal mood and affect, speech fluent and appropriate Neurologic: grossly non-focal.          Labs  on Admission:  Basic Metabolic Panel:  Recent Labs Lab 01/10/15 1408  NA 139  K 4.0  CL 104  CO2 26  GLUCOSE 88  BUN 22*  CREATININE 1.18  CALCIUM 8.1*   Liver Function Tests:  Recent Labs Lab 01/10/15 1408  AST 1462*  ALT  593*  ALKPHOS 146*  BILITOT 0.7  PROT 6.2*  ALBUMIN 3.6    Recent Labs Lab 01/10/15 1408  LIPASE 32   No results for input(s): AMMONIA in the last 168 hours. CBC:  Recent Labs Lab 01/10/15 1408  WBC 7.9  NEUTROABS 4.7  HGB 12.7*  HCT 38.2*  MCV 88.2  PLT 192   Cardiac Enzymes: No results for input(s): CKTOTAL, CKMB, CKMBINDEX, TROPONINI in the last 168 hours.  BNP (last 3 results) No results for input(s): BNP in the last 8760 hours.  ProBNP (last 3 results) No results for input(s): PROBNP in the last 8760 hours.  CBG: No results for input(s): GLUCAP in the last 168 hours.  Radiological Exams on Admission: Ct Abdomen Pelvis W Contrast  01/10/2015  CLINICAL DATA:  Abdominal pain starting last night, status post partial gastrectomy,, esophagitis, status post hernia repair, appendectomy, cholecystectomy. EXAM: CT ABDOMEN AND PELVIS WITH CONTRAST TECHNIQUE: Multidetector CT imaging of the abdomen and pelvis was performed using the standard protocol following bolus administration of intravenous contrast. CONTRAST:  125m OMNIPAQUE IOHEXOL 300 MG/ML  SOLN COMPARISON:  11/29/2014 FINDINGS: There is diffuse osteopenia. Lung bases are unremarkable. There is thickening of distal esophageal wall highly suspicious for esophagitis. The patient is status post partial gastrectomy. Enhanced liver is unremarkable. Enhanced pancreas, spleen and adrenal glands are unremarkable. No aortic aneurysm. Atherosclerotic calcifications are noted bilateral renal artery origin. Atherosclerotic calcifications are noted bilateral common iliac arteries. Kidneys are symmetrical in size and enhancement. No hydronephrosis or hydroureter. There is nonobstructive calcified  calculus in midpole of the left kidney measures 2.7 mm. Right renal cysts are noted the largest in midpole measures 3.9 cm. Delayed renal images shows bilateral renal symmetrical excretion. There is bilateral mild dilatation of extrarenal pelvis without frank hydronephrosis. Again noted status post partial colectomy. Again noted anastomosis in the region of mid sigmoid colon. There is moderate stool within rectum. The rectum measures 7.3 cm in diameter suspicious for mild fecal impaction. There is significant gaseous distension of the colon in mid pelvis and right lower quadrant. Findings highly suspicious for colonic ileus. There are gaseous distended small bowel loops in right abdomen. Axial image 11 mild thickening of small bowel wall in left abdomen. Mild enteritis is suspected. Best seen in axial image 26. Some small bowel air-fluid levels are noted in right abdomen please see axial image 14. Findings are suspicious for significant small bowel ileus or early small bowel obstruction. No any contrast material is noted in distal small bowel. Prostate gland and seminal vesicles are unremarkable. The urinary bladder is unremarkable. The patient is status post cholecystectomy. IMPRESSION: 1. There are gaseous distended small bowel loops in right abdomen with some air-fluid levels. Findings suspicious for ileus or early small bowel obstruction. There is some thickening of small bowel wall in left abdomen suspicious for enteritis. 2. Moderate distension of the rectum with stool up to 7.3 cm suspicious for mild fecal impaction. Again noted status post partial colectomy. There is significant gaseous distension of the colon in right abdomen just prior to anastomosis. Findings suspicious for colonic ileus or partial colonic obstruction. 3. No hydronephrosis or hydroureter. There is mild dilatation of bilateral extrarenal pelvis. Stable right renal cysts. 4. Status postcholecystectomy. 5. Status post partial gastrectomy. 6.  There is thickening of distal esophageal wall highly suspicious for esophagitis. Electronically Signed   By: LLahoma CrockerM.D.   On: 01/10/2015 15:37      Assessment/Plan   1. Partial small bowel  obstruction. IV fluids, nothing by mouth. I will try to hold off on NG tube for the time being as his symptoms are not significant and his abdominal distention is not large. However, if he does not improve, NG tube would be appropriate. Surgical consultation for further recommendations. 2. Small bowel enteritis. Start empirical intravenous antibiotics. 3. Adenocarcinoma of the colon, currently undergoing chemotherapy. 4. Hypertension. Intravenous hydralazine when necessary for blood pressure control. 5. History of PE. He is on chronic Lovenox. 6. Elevated liver enzymes. This appeared to be higher than previous. Monitor closely.  He'll be admitted to the medical floor. Further recommendations will depend on patient's hospital progress  Code Status: Full code.  DVT Prophylaxis: Lovenox.  Family Communication: I discussed the plan with the patient at the bedside.  Disposition Plan: Home when medically stable.  Time spent: 60 minutes.  Doree Albee [facility] Pager [phone].

## 2015-01-10 NOTE — Progress Notes (Signed)
Saahil B Schubach presented for Portacath access and flush.  Proper placement of portacath confirmed by CXR.  Portacath located left chest wall accessed with  H 20 needle.  Good blood return present. Portacath flushed with 20ml NS and 500U/5ml Heparin and needle removed intact.  Procedure tolerated well and without incident.    

## 2015-01-10 NOTE — ED Provider Notes (Addendum)
CSN: [id number]     Arrival date & time 01/10/15  1232 History   First MD Initiated Contact with Patient 01/10/15 1256     Chief Complaint  Patient presents with  . Emesis  . Abdominal Pain     (Consider location/radiation/quality/duration/timing/severity/associated sxs/prior Treatment) Patient is a 64 y.o. male presenting with vomiting and abdominal pain. The history is provided by the patient.  Emesis Associated symptoms: abdominal pain   Associated symptoms: no headaches   Abdominal Pain Associated symptoms: nausea and vomiting   Associated symptoms: no chest pain, no dysuria, no fever and no shortness of breath    patient with a history of colon cancer currently followed by hematology oncology undergoing chemotherapy. Has a Port-A-Cath in the left upper chest. Patient with onset of abdominal pain last night constant sharp crampy in nature reminded patient of past small bowel obstructions. Patient vomited 2 times today. Last vomited at 10 this morning. Patient is concerned about bowel obstruction. Abdominal discomfort is rated 8 out of 10. Patient has had surgery in the past for small bowel obstructions.  Past Medical History  Diagnosis Date  . Pulmonary embolism ([place]) 02/2010  . Hypertension   . Osteoporosis   . Arthritis   . TIA (transient ischemic attack) 10/11  . ETOH abuse     quit 03/2010  . 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 Personal history of PE (pulmonary embolism) 10/01/2010  . Blood transfusion   . S/P endoscopy September 28, 2010    erosive reflux esophagitis, Billroth I anatomy  . Shortness of breath   . Sleep apnea   . Recurrent upper respiratory infection (URI)   . Anxiety   . Pneumonia   . Anemia   . Ileus ([place])   . Chronic abdominal pain   . GERD (gastroesophageal reflux disease)   . Chest pain at rest   . Erosive esophagitis   . Vitamin B12 deficiency   . Bowel obstruction ([place]) 05/13/2012    Recurrent  . Hx of Clostridium difficile infection 01/2012  .  Bronchitis   . 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 Obstruction of bowel ([place]) 03/03/14   Past Surgical History  Procedure Laterality Date  . Hernia repair      right inguinal  . Appendectomy  1980s  . Cholecystectomy  1980s  . Portacath placement    . Abdominal sugery      for bowel obstruction x 8, all in 1980s, except for one in 07/2010  . Esophagogastroduodenoscopy  09/28/2010  . Esophagogastroduodenoscopy  12/01/2010    Cervical web status post dilation, erosive esophagitis, B1 hemigastrectomy, inflamed anastomosis  . Colonoscopy  03/18/2011    anastomosis at 35cm. Several adenomatous polyps removed. Sigmoid diverticulosis. Next TCS 02/2013  . Esophagogastroduodenoscopy  04/16/2011    excoriation at GEJ c/w trauma/M-W tear, friable gastric anastomosis, dilation efferent limb  . Billroth 1 hemigastrectomy  1980s    per patient for benign duodenal tumor  . Esophagogastroduodenoscopy (egd) with esophageal dilation  02/25/2012    BTD:VVOHYWVP esophageal web-s/p dilation anddisruption as described above. Status post prior gastric with Billroth I configuration. Abnormal gastric mucosa at the anastomosis. Gastric biopsy showed mild chronic inflammation but no H. pylori   . Colonoscopy N/A 07/24/2012    XTG:GYIRSW post segmental resection with normal-appearing colonic anastomosis aside from an adjacent polyp-removed as described above. Rectal polyp-removed as described above. CT findings appear to have been artifactual. tubular adenomas/prolapsed type polyp.  [name]  Colon surgery  May 2012    left hemicolectomy, colon cancer found at time of surgery for bowel obstruction  . Cardiac catheterization  07/17/2012  . Abdominal adhesion surgery  03/04/15    @ [facility]  . Esophagogastroduodenoscopy N/A 06/03/2014    Procedure: ESOPHAGOGASTRODUODENOSCOPY (EGD);  Surgeon: [name], MD;  [facility];  Service: Endoscopy;  Laterality: N/A;  2:45  . Savory dilation  N/A 06/03/2014    Procedure: SAVORY DILATION;  Surgeon: [name], MD;  Location: [facility];  Service: Endoscopy;  Laterality: N/A;  [name] dilation N/A 06/03/2014    Procedure: [name] DILATION;  Surgeon: [name], MD;  Location: [facility];  Service: Endoscopy;  Laterality: N/A;   Family History  Problem Relation Age of Onset  . Hypertension Mother   . Arthritis Mother   . Pneumonia Mother   . Hypertension Father   . Heart attack Father    Social History  Substance Use Topics  . Smoking status: Former Smoker -- 0.50 packs/day for 40 years    Types: Cigarettes    Quit date: 12/20/2012  . Smokeless tobacco: Never Used  . Alcohol Use: No    Review of Systems  Constitutional: Negative for fever.  HENT: Negative for congestion.   Eyes: Negative for visual disturbance.  Respiratory: Negative for shortness of breath.   Cardiovascular: Negative for chest pain.  Gastrointestinal: Positive for nausea, vomiting and abdominal pain.  Genitourinary: Negative for dysuria.  Musculoskeletal: Negative for back pain.  Skin: Negative for rash.  Neurological: Negative for headaches.  Hematological: Does not bruise/bleed easily.  Psychiatric/Behavioral: Negative for confusion.      Allergies  Review of patient's allergies indicates no known allergies.  Home Medications   Prior to Admission medications   Medication Sig Start Date End Date Taking? Authorizing Provider  albuterol (PROAIR HFA) 108 (90 BASE) MCG/ACT inhaler Inhale into the lungs every 6 (six) hours as needed for wheezing or shortness of breath.    Historical Provider, MD  alendronate (FOSAMAX) 70 MG tablet Take 70 mg by mouth every 7 (seven) days. On sundays 10/14/11   Historical Provider, MD  atorvastatin (LIPITOR) 20 MG tablet Take 20 mg by mouth daily.    Historical Provider, MD  cholecalciferol (VITAMIN D) 1000 UNITS tablet Take 1,000 Units by mouth every morning.    Historical Provider, MD   cyanocobalamin (,VITAMIN B-12,) 1000 MCG/ML injection Inject 1,000 mcg into the muscle every 30 (thirty) days.    Historical Provider, MD  cyclobenzaprine (FLEXERIL) 10 MG tablet Take 10 mg by mouth daily as needed for muscle spasms.  11/18/11   Historical Provider, MD  dexlansoprazole (DEXILANT) 60 MG capsule Take 1 capsule (60 mg total) by mouth every morning. 06/11/14   Orvil Feil, NP  docusate sodium (COLACE) 100 MG capsule Take 100 mg by mouth 2 (two) times daily.    Historical Provider, MD  enoxaparin (LOVENOX) 60 MG/0.6ML injection Inject 60 mg into the skin every evening.     Historical Provider, MD  ENSURE PLUS (ENSURE PLUS) LIQD Take 237 mLs by mouth daily. Every other day    Historical Provider, MD  ferrous [id number] C-folic acid (FEROCON) capsule Take 1 capsule by mouth daily.    Historical Provider, MD  ferrous sulfate 325 (65 FE) MG tablet Take 325 mg by mouth daily with breakfast.    Historical Provider, MD  folic acid (FOLVITE) 1 MG tablet Take 1 mg by mouth every morning.  Historical Provider, MD  lidocaine-prilocaine (EMLA) cream Apply 1 application topically as needed. 03/29/14   Patrici Ranks, MD  LORazepam (ATIVAN) 1 MG tablet Take 1 mg by mouth every 4 (four) hours as needed. anxiety    Historical Provider, MD  Melatonin 10 MG CAPS Take 10 mg by mouth at bedtime.    Historical Provider, MD  metoCLOPramide (REGLAN) 10 MG tablet Take 1 tablet (10 mg total) by mouth every 6 (six) hours as needed for nausea. 12/03/14   Kathie Dike, MD  Multiple Vitamins-Minerals (MULTIVITAMINS THER. W/MINERALS) TABS tablet Take 1 tablet by mouth daily.    Historical Provider, MD  ondansetron (ZOFRAN) 4 MG tablet Take 1 tablet (4 mg total) by mouth every 8 (eight) hours as needed for nausea. 02/22/14   Aviva Signs, MD  polyethylene glycol powder (GLYCOLAX/MIRALAX) powder Take 17 g by mouth as needed. For constipation    Historical Provider, MD  Probiotic Product (PROBIOTIC DAILY)  CAPS Take 1 capsule by mouth daily.    Historical Provider, MD  prochlorperazine (COMPAZINE) 10 MG tablet Take 10 mg by mouth every 6 (six) hours as needed. Nausea and Vomiting    Historical Provider, MD  psyllium (REGULOID) 0.52 G capsule Take 5 capsules by mouth 2 (two) times daily.     Historical Provider, MD  sertraline (ZOLOFT) 100 MG tablet Take 150 mg by mouth at bedtime.     Historical Provider, MD  sucralfate (CARAFATE) 1 G tablet Take 1 g by mouth 4 (four) times daily.    Historical Provider, MD  sucralfate (CARAFATE) 1 GM/10ML suspension Take 10 mLs (1 g total) by mouth 4 (four) times daily. As needed 05/01/13   Mahala Menghini, PA-C  thiamine 100 MG tablet Take 100 mg by mouth every morning.     Historical Provider, MD  traMADol (ULTRAM) 50 MG tablet Take 50 mg by mouth every 6 (six) hours as needed for moderate pain or severe pain.     Historical Provider, MD  valsartan-hydrochlorothiazide (DIOVAN-HCT) 80-12.5 MG per tablet Take 1 tablet by mouth every morning.     Historical Provider, MD  Vitamin D, Ergocalciferol, (DRISDOL) 50000 UNITS CAPS Take 50,000 Units by mouth once a week. Takes on Sunday 03/13/12   Historical Provider, MD  Wheat Dextrin (BENEFIBER PO) Take 1 each by mouth daily as needed (to regulate bowel movements/for supplement).     Historical Provider, MD   BP 127/91 mmHg  Pulse 91  Temp(Src) 98.1 F (36.7 C) (Oral)  Resp 18  Ht '5\' 9"'$  (1.753 m)  Wt 144 lb (65.318 kg)  BMI 21.26 kg/m2  SpO2 99% Physical Exam  Constitutional: He is oriented to person, place, and time. He appears well-developed and well-nourished. No distress.  HENT:  Head: Normocephalic and atraumatic.  Mouth/Throat: Oropharynx is clear and moist.  Eyes: Conjunctivae and EOM are normal. Pupils are equal, round, and reactive to light.  Neck: Normal range of motion. Neck supple.  Cardiovascular: Normal rate, regular rhythm and normal heart sounds.   No murmur heard. Pulmonary/Chest: Effort normal  and breath sounds normal. No respiratory distress.  Left upper chest Port-A-Cath  Abdominal: Soft. Bowel sounds are normal. He exhibits distension. There is tenderness.  Generalized abdominal tenderness.  Musculoskeletal: Normal range of motion.  Neurological: He is alert and oriented to person, place, and time. No cranial nerve deficit. He exhibits normal muscle tone. Coordination normal.  Nursing note and vitals reviewed.   ED Course  Procedures (including critical care time)  Labs Review Labs Reviewed  COMPREHENSIVE METABOLIC PANEL - Abnormal; Notable for the following:    BUN 22 (*)    Calcium 8.1 (*)    Total Protein 6.2 (*)    AST 1462 (*)    ALT 593 (*)    Alkaline Phosphatase 146 (*)    All other components within normal limits  CBC WITH DIFFERENTIAL/PLATELET - Abnormal; Notable for the following:    Hemoglobin 12.7 (*)    HCT 38.2 (*)    RDW 17.0 (*)    All other components within normal limits  LIPASE, BLOOD   Results for orders placed or performed during the hospital encounter of 01/10/15  Comprehensive metabolic panel  Result Value Ref Range   Sodium 139 135 - 145 mmol/L   Potassium 4.0 3.5 - 5.1 mmol/L   Chloride 104 101 - 111 mmol/L   CO2 26 22 - 32 mmol/L   Glucose, Bld 88 65 - 99 mg/dL   BUN 22 (H) 6 - 20 mg/dL   Creatinine, Ser 1.18 0.61 - 1.24 mg/dL   Calcium 8.1 (L) 8.9 - 10.3 mg/dL   Total Protein 6.2 (L) 6.5 - 8.1 g/dL   Albumin 3.6 3.5 - 5.0 g/dL   AST 1462 (H) 15 - 41 U/L   ALT 593 (H) 17 - 63 U/L   Alkaline Phosphatase 146 (H) 38 - 126 U/L   Total Bilirubin 0.7 0.3 - 1.2 mg/dL   GFR calc non Af Amer >60 >60 mL/min   GFR calc Af Amer >60 >60 mL/min   Anion gap 9 5 - 15  Lipase, blood  Result Value Ref Range   Lipase 32 11 - 51 U/L  CBC with Differential/Platelet  Result Value Ref Range   WBC 7.9 4.0 - 10.5 K/uL   RBC 4.33 4.22 - 5.81 MIL/uL   Hemoglobin 12.7 (L) 13.0 - 17.0 g/dL   HCT 38.2 (L) 39.0 - 52.0 %   MCV 88.2 78.0 - 100.0 fL    MCH 29.3 26.0 - 34.0 pg   MCHC 33.2 30.0 - 36.0 g/dL   RDW 17.0 (H) 11.5 - 15.5 %   Platelets 192 150 - 400 K/uL   Neutrophils Relative % 60 %   Neutro Abs 4.7 1.7 - 7.7 K/uL   Lymphocytes Relative 27 %   Lymphs Abs 2.1 0.7 - 4.0 K/uL   Monocytes Relative 12 %   Monocytes Absolute 0.9 0.1 - 1.0 K/uL   Eosinophils Relative 1 %   Eosinophils Absolute 0.1 0.0 - 0.7 K/uL   Basophils Relative 1 %   Basophils Absolute 0.0 0.0 - 0.1 K/uL     Imaging Review Ct Abdomen Pelvis W Contrast  01/10/2015  CLINICAL DATA:  Abdominal pain starting last night, status post partial gastrectomy,, esophagitis, status post hernia repair, appendectomy, cholecystectomy. EXAM: CT ABDOMEN AND PELVIS WITH CONTRAST TECHNIQUE: Multidetector CT imaging of the abdomen and pelvis was performed using the standard protocol following bolus administration of intravenous contrast. CONTRAST:  152m OMNIPAQUE IOHEXOL 300 MG/ML  SOLN COMPARISON:  11/29/2014 FINDINGS: There is diffuse osteopenia. Lung bases are unremarkable. There is thickening of distal esophageal wall highly suspicious for esophagitis. The patient is status post partial gastrectomy. Enhanced liver is unremarkable. Enhanced pancreas, spleen and adrenal glands are unremarkable. No aortic aneurysm. Atherosclerotic calcifications are noted bilateral renal artery origin. Atherosclerotic calcifications are noted bilateral common iliac arteries. Kidneys are symmetrical in size and enhancement. No hydronephrosis or hydroureter. There is nonobstructive calcified calculus in midpole  of the left kidney measures 2.7 mm. Right renal cysts are noted the largest in midpole measures 3.9 cm. Delayed renal images shows bilateral renal symmetrical excretion. There is bilateral mild dilatation of extrarenal pelvis without frank hydronephrosis. Again noted status post partial colectomy. Again noted anastomosis in the region of mid sigmoid colon. There is moderate stool within rectum. The  rectum measures 7.3 cm in diameter suspicious for mild fecal impaction. There is significant gaseous distension of the colon in mid pelvis and right lower quadrant. Findings highly suspicious for colonic ileus. There are gaseous distended small bowel loops in right abdomen. Axial image 11 mild thickening of small bowel wall in left abdomen. Mild enteritis is suspected. Best seen in axial image 26. Some small bowel air-fluid levels are noted in right abdomen please see axial image 14. Findings are suspicious for significant small bowel ileus or early small bowel obstruction. No any contrast material is noted in distal small bowel. Prostate gland and seminal vesicles are unremarkable. The urinary bladder is unremarkable. The patient is status post cholecystectomy. IMPRESSION: 1. There are gaseous distended small bowel loops in right abdomen with some air-fluid levels. Findings suspicious for ileus or early small bowel obstruction. There is some thickening of small bowel wall in left abdomen suspicious for enteritis. 2. Moderate distension of the rectum with stool up to 7.3 cm suspicious for mild fecal impaction. Again noted status post partial colectomy. There is significant gaseous distension of the colon in right abdomen just prior to anastomosis. Findings suspicious for colonic ileus or partial colonic obstruction. 3. No hydronephrosis or hydroureter. There is mild dilatation of bilateral extrarenal pelvis. Stable right renal cysts. 4. Status postcholecystectomy. 5. Status post partial gastrectomy. 6. There is thickening of distal esophageal wall highly suspicious for esophagitis. Electronically Signed   By: Lahoma Crocker M.D.   On: 01/10/2015 15:37   I have personally reviewed and evaluated these images and lab results as part of my medical decision-making.   EKG Interpretation None      MDM   Final diagnoses:  SBO (small bowel obstruction) (HCC)    CT scan consistent with the ileus or early partial  small bowel obstruction. Patient will be admitted and placed on bowel rest. General surgery consult did. Hospitalist will admit to MedSurg bed. Patient the also followed by Dr. Whitney Muse for colon cancer.  Patient with significant increase in liver function tests compared to September. This may be related to the chemotherapy he is receiving. CT scan did not show any evidence of any significant liver problems patient is status post cholecystectomy. Patient also had a partial gastrectomy in the past.    Fredia Sorrow, MD 01/10/15 [place], MD 01/10/15 1714

## 2015-01-10 NOTE — ED Notes (Signed)
Pt upper abdominal pain began last night, described as constant and sharp. Pt states he vomited x 2 today. Last vomited at 1000

## 2015-01-10 NOTE — Patient Instructions (Signed)
Port flush Follow up as scheduled Please call the clinic if you have any questions or concerns

## 2015-01-11 ENCOUNTER — Encounter (HOSPITAL_COMMUNITY): Payer: Self-pay

## 2015-01-11 ENCOUNTER — Inpatient Hospital Stay (HOSPITAL_COMMUNITY): Payer: Medicare Other

## 2015-01-11 LAB — COMPREHENSIVE METABOLIC PANEL
ALBUMIN: 3.1 g/dL — AB (ref 3.5–5.0)
ALK PHOS: 176 U/L — AB (ref 38–126)
ALT: 371 U/L — ABNORMAL HIGH (ref 17–63)
ANION GAP: 7 (ref 5–15)
AST: 384 U/L — ABNORMAL HIGH (ref 15–41)
BUN: 15 mg/dL (ref 6–20)
CALCIUM: 7.4 mg/dL — AB (ref 8.9–10.3)
CO2: 27 mmol/L (ref 22–32)
Chloride: 104 mmol/L (ref 101–111)
Creatinine, Ser: 0.94 mg/dL (ref 0.61–1.24)
GFR calc non Af Amer: >60 mL/min (ref >60)
Glucose, Bld: 83 mg/dL (ref 65–99)
Potassium: 4.1 mmol/L (ref 3.5–5.1)
SODIUM: 138 mmol/L (ref 135–145)
TOTAL PROTEIN: 5.5 g/dL — AB (ref 6.5–8.1)
Total Bilirubin: 0.9 mg/dL (ref 0.3–1.2)

## 2015-01-11 LAB — CBC
HEMATOCRIT: 36.6 % — AB (ref 39.0–52.0)
HEMOGLOBIN: 12 g/dL — AB (ref 13.0–17.0)
MCH: 29.3 pg (ref 26.0–34.0)
MCHC: 32.8 g/dL (ref 30.0–36.0)
MCV: 89.3 fL (ref 78.0–100.0)
Platelets: 169 10*3/uL (ref 150–400)
RBC: 4.1 MIL/uL — AB (ref 4.22–5.81)
RDW: 17.2 % — ABNORMAL HIGH (ref 11.5–15.5)
WBC: 11.2 10*3/uL — ABNORMAL HIGH (ref 4.0–10.5)

## 2015-01-11 MED ORDER — CHLORHEXIDINE GLUCONATE 0.12 % MT SOLN
15.0000 mL | Freq: Two times a day (BID) | OROMUCOSAL | Status: DC
  Administered 2015-01-11 – 2015-01-13 (×5): 15 mL via OROMUCOSAL
  Filled 2015-01-11 (×4): qty 15

## 2015-01-11 MED ORDER — POLYETHYLENE GLYCOL 3350 17 G PO PACK
17.0000 g | PACK | Freq: Two times a day (BID) | ORAL | Status: DC
  Administered 2015-01-11 – 2015-01-13 (×5): 17 g via ORAL
  Filled 2015-01-11 (×6): qty 1

## 2015-01-11 MED ORDER — CETYLPYRIDINIUM CHLORIDE 0.05 % MT LIQD
7.0000 mL | Freq: Two times a day (BID) | OROMUCOSAL | Status: DC
  Administered 2015-01-11 – 2015-01-13 (×5): 7 mL via OROMUCOSAL

## 2015-01-11 NOTE — Progress Notes (Signed)
[facility] PROGRESS NOTE  [name] GEZ:[id number] DOB: 09-09-1950 DOA: 01/10/2015 PCP: [name], MD  Assessment/Plan:  Partial small bowel obstruction in the setting of bowel dysmotility/atony with 14 prior abdominal surgeries. - Continue IV fluids and NPO. - Surgery following. No indication for NG tube at this time. Continue Miralax per surgery.   Transaminitis - Improved from yesterday however still significantly elevated -Etiology unclear. Hx of alcohol abuse, in remission since 2012.  - Prior cholecystectomy.  Will order US of the RUQ and will r/o viral hepatitis  Adenocarcinoma of the colon. -Followed by oncology. Under surveillance.  Hypertension. - Stable. Continue IV hydralazine as needed for blood pressure control.  History of PE.  - Noted   Code Status: full  Family Communication: No family at bedside. Discussed with patient who understands and has no concerns at this time. Disposition Plan: Anticipate discharge when improved.     Consultants:  General surgery- Dr. Arnoldo Morale  Antibiotics:    Subjective:  Still feels bloated but denies any nausea or vomiting.   Objective: Filed Vitals:   01/10/15 1645 01/10/15 1839 01/10/15 2237 01/11/15 0532  BP: 127/91 136/93 122/73 129/80  Pulse: 91 81 82 83  Temp:   98.7 F (37.1 C) 98.6 F (37 C)  TempSrc:   Oral Oral  Resp: '18 18 18 18  '$ Height:  '5\' 9"'$  (1.753 m)    Weight:  66.407 kg (146 lb 6.4 oz)    SpO2: 99% 98% 95% 98%    Intake/Output Summary (Last 24 hours) at 01/11/15 0750 Last data filed at 01/11/15 0500  Gross per 24 hour  Intake   1415 ml  Output    850 ml  Net    565 ml   Filed Weights   01/10/15 1245 01/10/15 1839  Weight: 65.318 kg (144 lb) 66.407 kg (146 lb 6.4 oz)    Exam:   General:  AA Ox3  Cardiovascular: RRR  Respiratory: CTA B  Abdomen:  S/NT/ND/ +BS   Extremities: no C/C/E  Neurologic:  Grossly intact and non-focal  Data Reviewed: Basic  Metabolic Panel:  Recent Labs Lab 01/10/15 1408 01/11/15 0616  NA 139 138  K 4.0 4.1  CL 104 104  CO2 26 27  GLUCOSE 88 83  BUN 22* 15  CREATININE 1.18 0.94  CALCIUM 8.1* 7.4*   Liver Function Tests:  Recent Labs Lab 01/10/15 1408 01/11/15 0616  AST 1462* 384*  ALT 593* 371*  ALKPHOS 146* 176*  BILITOT 0.7 0.9  PROT 6.2* 5.5*  ALBUMIN 3.6 3.1*    Recent Labs Lab 01/10/15 1408  LIPASE 32    CBC:  Recent Labs Lab 01/10/15 1408 01/11/15 0616  WBC 7.9 11.2*  NEUTROABS 4.7  --   HGB 12.7* 12.0*  HCT 38.2* 36.6*  MCV 88.2 89.3  PLT 192 169      Studies: Ct Abdomen Pelvis W Contrast  01/10/2015  CLINICAL DATA:  Abdominal pain starting last night, status post partial gastrectomy,, esophagitis, status post hernia repair, appendectomy, cholecystectomy. EXAM: CT ABDOMEN AND PELVIS WITH CONTRAST TECHNIQUE: Multidetector CT imaging of the abdomen and pelvis was performed using the standard protocol following bolus administration of intravenous contrast. CONTRAST:  190m OMNIPAQUE IOHEXOL 300 MG/ML  SOLN COMPARISON:  11/29/2014 FINDINGS: There is diffuse osteopenia. Lung bases are unremarkable. There is thickening of distal esophageal wall highly suspicious for esophagitis. The patient is status post partial gastrectomy. Enhanced liver is unremarkable. Enhanced pancreas, spleen and adrenal glands are unremarkable.  No aortic aneurysm. Atherosclerotic calcifications are noted bilateral renal artery origin. Atherosclerotic calcifications are noted bilateral common iliac arteries. Kidneys are symmetrical in size and enhancement. No hydronephrosis or hydroureter. There is nonobstructive calcified calculus in midpole of the left kidney measures 2.7 mm. Right renal cysts are noted the largest in midpole measures 3.9 cm. Delayed renal images shows bilateral renal symmetrical excretion. There is bilateral mild dilatation of extrarenal pelvis without frank hydronephrosis. Again noted  status post partial colectomy. Again noted anastomosis in the region of mid sigmoid colon. There is moderate stool within rectum. The rectum measures 7.3 cm in diameter suspicious for mild fecal impaction. There is significant gaseous distension of the colon in mid pelvis and right lower quadrant. Findings highly suspicious for colonic ileus. There are gaseous distended small bowel loops in right abdomen. Axial image 11 mild thickening of small bowel wall in left abdomen. Mild enteritis is suspected. Best seen in axial image 26. Some small bowel air-fluid levels are noted in right abdomen please see axial image 14. Findings are suspicious for significant small bowel ileus or early small bowel obstruction. No any contrast material is noted in distal small bowel. Prostate gland and seminal vesicles are unremarkable. The urinary bladder is unremarkable. The patient is status post cholecystectomy. IMPRESSION: 1. There are gaseous distended small bowel loops in right abdomen with some air-fluid levels. Findings suspicious for ileus or early small bowel obstruction. There is some thickening of small bowel wall in left abdomen suspicious for enteritis. 2. Moderate distension of the rectum with stool up to 7.3 cm suspicious for mild fecal impaction. Again noted status post partial colectomy. There is significant gaseous distension of the colon in right abdomen just prior to anastomosis. Findings suspicious for colonic ileus or partial colonic obstruction. 3. No hydronephrosis or hydroureter. There is mild dilatation of bilateral extrarenal pelvis. Stable right renal cysts. 4. Status postcholecystectomy. 5. Status post partial gastrectomy. 6. There is thickening of distal esophageal wall highly suspicious for esophagitis. Electronically Signed   By: Lahoma Crocker M.D.   On: 01/10/2015 15:37    Scheduled Meds: . antiseptic oral rinse  7 mL Mouth Rinse q12n4p  . chlorhexidine  15 mL Mouth Rinse BID  . ciprofloxacin  400 mg  Intravenous Q12H  . enoxaparin  60 mg Subcutaneous Q24H  . metronidazole  500 mg Intravenous Q8H   Continuous Infusions: . sodium chloride 100 mL/hr at 01/11/15 0500    Active Problems:   Adenocarcinoma of colon with mucinous features   Erosive esophagitis   Small bowel obstruction (HCC)   Partial small bowel obstruction (HCC)   SBO (small bowel obstruction) (HCC)   Essential hypertension    Time spent: 20 minutes. Greater than 50% of this time was spent in direct contact with the patient coordinating care.  Domingo Mend, MD [facility] Pager [phone]  If 7PM-7AM, please contact night-coverage at [email or web address], password [email or web address] 01/11/2015, 7:50 AM  LOS: 1 day     By signing my name below, I, [name] Doctor, attest that this documentation has been prepared under the direction and in the presence of Domingo Mend, MD Electronically Signed: [name] Doctor, Scribe. 01/11/2015 10:46am  I have reviewed the above documentation for accuracy and completeness, and I agree with the above.  Domingo Mend, MD [facility] Pager: [phone]

## 2015-01-11 NOTE — Progress Notes (Signed)
Utilization review Completed Cortlan Dolin RN BSN   

## 2015-01-11 NOTE — Consult Note (Signed)
Reason for Consult: Recurrent bowel obstruction Referring Physician: Hospitalist  [name] is an 65 y.o. male.  HPI: Patient is a 64 year old white male with a long-standing history of bowel dysfunction secondary to multiple surgeries in the past. He was last in the hospital in September of this year. He has recurrent episodes of nausea and nonspecific abdominal pain. 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 He is not a surgery candidate. He states this current episode is similar to when he had last month. Last had a small bowel movement several days ago.  Past Medical History  Diagnosis Date  . Pulmonary embolism ([place]) 02/2010  . Hypertension   . Osteoporosis   . Arthritis   . TIA (transient ischemic attack) 10/11  . ETOH abuse     quit 03/2010  . 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 Personal history of PE (pulmonary embolism) 10/01/2010  . Blood transfusion   . S/P endoscopy September 28, 2010    erosive reflux esophagitis, Billroth I anatomy  . Shortness of breath   . Sleep apnea   . Recurrent upper respiratory infection (URI)   . Anxiety   . Pneumonia   . Anemia   . Ileus ([place])   . Chronic abdominal pain   . GERD (gastroesophageal reflux disease)   . Chest pain at rest   . Erosive esophagitis   . Vitamin B12 deficiency   . Bowel obstruction ([place]) 05/13/2012    Recurrent  . Hx of Clostridium difficile infection 01/2012  . Bronchitis   . 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 Obstruction of bowel ([place]) 03/03/14    Past Surgical History  Procedure Laterality Date  . Hernia repair      right inguinal  . Appendectomy  1980s  . Cholecystectomy  1980s  . Portacath placement    . Abdominal sugery      for bowel obstruction x 8, all in 1980s, except for one in 07/2010  . Esophagogastroduodenoscopy  09/28/2010  . Esophagogastroduodenoscopy  12/01/2010    Cervical web status post dilation, erosive esophagitis, B1 hemigastrectomy, inflamed  anastomosis  . Colonoscopy  03/18/2011    anastomosis at 35cm. Several adenomatous polyps removed. Sigmoid diverticulosis. Next TCS 02/2013  . Esophagogastroduodenoscopy  04/16/2011    excoriation at GEJ c/w trauma/M-W tear, friable gastric anastomosis, dilation efferent limb  . Billroth 1 hemigastrectomy  1980s    per patient for benign duodenal tumor  . Esophagogastroduodenoscopy (egd) with esophageal dilation  02/25/2012    RUE:AVWUJWJX esophageal web-s/p dilation anddisruption as described above. Status post prior gastric with Billroth I configuration. Abnormal gastric mucosa at the anastomosis. Gastric biopsy showed mild chronic inflammation but no H. pylori   . Colonoscopy N/A 07/24/2012    BJY:NWGNFA post segmental resection with normal-appearing colonic anastomosis aside from an adjacent polyp-removed as described above. Rectal polyp-removed as described above. CT findings appear to have been artifactual. tubular adenomas/prolapsed type polyp.  . Colon surgery  May 2012    left hemicolectomy, colon cancer found at time of surgery for bowel obstruction  . Cardiac catheterization  07/17/2012  . Abdominal adhesion surgery  03/04/15    @ [facility]  . Esophagogastroduodenoscopy N/A 06/03/2014    Procedure: ESOPHAGOGASTRODUODENOSCOPY (EGD);  Surgeon: [name], MD;  [facility];  Service: Endoscopy;  Laterality: N/A;  2:45  . Savory dilation N/A 06/03/2014    Procedure: SAVORY DILATION;  Surgeon: [name], MD;  Location:  [facility];  Service: Endoscopy;  Laterality: N/A;  [name] dilation N/A 06/03/2014    Procedure: [name] DILATION;  Surgeon: [name], MD;  Location: [facility];  Service: Endoscopy;  Laterality: N/A;    Family History  Problem Relation Age of Onset  . Hypertension Mother   . Arthritis Mother   . Pneumonia Mother   . Hypertension Father   . Heart attack Father     Social History:  reports that he quit smoking about 2 years ago. His smoking  use included Cigarettes. He has a 20 pack-year smoking history. He has never used smokeless tobacco. He reports that he does not drink alcohol or use illicit drugs.  Allergies: No Known Allergies  Medications: I have reviewed the patient's current medications.  Results for orders placed or performed during the hospital encounter of 01/10/15 (from the past 48 hour(s))  Comprehensive metabolic panel     Status: Abnormal   Collection Time: 01/10/15  2:08 PM  Result Value Ref Range   Sodium 139 135 - 145 mmol/L   Potassium 4.0 3.5 - 5.1 mmol/L   Chloride 104 101 - 111 mmol/L   CO2 26 22 - 32 mmol/L   Glucose, Bld 88 65 - 99 mg/dL   BUN 22 (H) 6 - 20 mg/dL   Creatinine, Ser 1.18 0.61 - 1.24 mg/dL   Calcium 8.1 (L) 8.9 - 10.3 mg/dL   Total Protein 6.2 (L) 6.5 - 8.1 g/dL   Albumin 3.6 3.5 - 5.0 g/dL   AST 1462 (H) 15 - 41 U/L   ALT 593 (H) 17 - 63 U/L   Alkaline Phosphatase 146 (H) 38 - 126 U/L   Total Bilirubin 0.7 0.3 - 1.2 mg/dL   GFR calc non Af Amer >60 >60 mL/min   GFR calc Af Amer >60 >60 mL/min    Comment: (NOTE) The eGFR has been calculated using the CKD EPI equation. This calculation has not been validated in all clinical situations. eGFR's persistently <60 mL/min signify possible Chronic Kidney Disease.    Anion gap 9 5 - 15  Lipase, blood     Status: None   Collection Time: 01/10/15  2:08 PM  Result Value Ref Range   Lipase 32 11 - 51 U/L    Comment: Please note change in reference range.  CBC with Differential/Platelet     Status: Abnormal   Collection Time: 01/10/15  2:08 PM  Result Value Ref Range   WBC 7.9 4.0 - 10.5 K/uL   RBC 4.33 4.22 - 5.81 MIL/uL   Hemoglobin 12.7 (L) 13.0 - 17.0 g/dL   HCT 38.2 (L) 39.0 - 52.0 %   MCV 88.2 78.0 - 100.0 fL   MCH 29.3 26.0 - 34.0 pg   MCHC 33.2 30.0 - 36.0 g/dL   RDW 17.0 (H) 11.5 - 15.5 %   Platelets 192 150 - 400 K/uL   Neutrophils Relative % 60 %   Neutro Abs 4.7 1.7 - 7.7 K/uL   Lymphocytes Relative 27 %   Lymphs  Abs 2.1 0.7 - 4.0 K/uL   Monocytes Relative 12 %   Monocytes Absolute 0.9 0.1 - 1.0 K/uL   Eosinophils Relative 1 %   Eosinophils Absolute 0.1 0.0 - 0.7 K/uL   Basophils Relative 1 %   Basophils Absolute 0.0 0.0 - 0.1 K/uL  Comprehensive metabolic panel     Status: Abnormal   Collection Time: 01/11/15  6:16 AM  Result Value Ref Range  Sodium 138 135 - 145 mmol/L   Potassium 4.1 3.5 - 5.1 mmol/L   Chloride 104 101 - 111 mmol/L   CO2 27 22 - 32 mmol/L   Glucose, Bld 83 65 - 99 mg/dL   BUN 15 6 - 20 mg/dL   Creatinine, Ser 0.94 0.61 - 1.24 mg/dL   Calcium 7.4 (L) 8.9 - 10.3 mg/dL   Total Protein 5.5 (L) 6.5 - 8.1 g/dL   Albumin 3.1 (L) 3.5 - 5.0 g/dL   AST 384 (H) 15 - 41 U/L   ALT 371 (H) 17 - 63 U/L   Alkaline Phosphatase 176 (H) 38 - 126 U/L   Total Bilirubin 0.9 0.3 - 1.2 mg/dL   GFR calc non Af Amer >60 >60 mL/min   GFR calc Af Amer >60 >60 mL/min    Comment: (NOTE) The eGFR has been calculated using the CKD EPI equation. This calculation has not been validated in all clinical situations. eGFR's persistently <60 mL/min signify possible Chronic Kidney Disease.    Anion gap 7 5 - 15  CBC     Status: Abnormal   Collection Time: 01/11/15  6:16 AM  Result Value Ref Range   WBC 11.2 (H) 4.0 - 10.5 K/uL   RBC 4.10 (L) 4.22 - 5.81 MIL/uL   Hemoglobin 12.0 (L) 13.0 - 17.0 g/dL   HCT 36.6 (L) 39.0 - 52.0 %   MCV 89.3 78.0 - 100.0 fL   MCH 29.3 26.0 - 34.0 pg   MCHC 32.8 30.0 - 36.0 g/dL   RDW 17.2 (H) 11.5 - 15.5 %   Platelets 169 150 - 400 K/uL    Ct Abdomen Pelvis W Contrast  01/10/2015  CLINICAL DATA:  Abdominal pain starting last night, status post partial gastrectomy,, esophagitis, status post hernia repair, appendectomy, cholecystectomy. EXAM: CT ABDOMEN AND PELVIS WITH CONTRAST TECHNIQUE: Multidetector CT imaging of the abdomen and pelvis was performed using the standard protocol following bolus administration of intravenous contrast. CONTRAST:  186m OMNIPAQUE  IOHEXOL 300 MG/ML  SOLN COMPARISON:  11/29/2014 FINDINGS: There is diffuse osteopenia. Lung bases are unremarkable. There is thickening of distal esophageal wall highly suspicious for esophagitis. The patient is status post partial gastrectomy. Enhanced liver is unremarkable. Enhanced pancreas, spleen and adrenal glands are unremarkable. No aortic aneurysm. Atherosclerotic calcifications are noted bilateral renal artery origin. Atherosclerotic calcifications are noted bilateral common iliac arteries. Kidneys are symmetrical in size and enhancement. No hydronephrosis or hydroureter. There is nonobstructive calcified calculus in midpole of the left kidney measures 2.7 mm. Right renal cysts are noted the largest in midpole measures 3.9 cm. Delayed renal images shows bilateral renal symmetrical excretion. There is bilateral mild dilatation of extrarenal pelvis without frank hydronephrosis. Again noted status post partial colectomy. Again noted anastomosis in the region of mid sigmoid colon. There is moderate stool within rectum. The rectum measures 7.3 cm in diameter suspicious for mild fecal impaction. There is significant gaseous distension of the colon in mid pelvis and right lower quadrant. Findings highly suspicious for colonic ileus. There are gaseous distended small bowel loops in right abdomen. Axial image 11 mild thickening of small bowel wall in left abdomen. Mild enteritis is suspected. Best seen in axial image 26. Some small bowel air-fluid levels are noted in right abdomen please see axial image 14. Findings are suspicious for significant small bowel ileus or early small bowel obstruction. No any contrast material is noted in distal small bowel. Prostate gland and seminal vesicles are unremarkable. The urinary  bladder is unremarkable. The patient is status post cholecystectomy. IMPRESSION: 1. There are gaseous distended small bowel loops in right abdomen with some air-fluid levels. Findings suspicious for  ileus or early small bowel obstruction. There is some thickening of small bowel wall in left abdomen suspicious for enteritis. 2. Moderate distension of the rectum with stool up to 7.3 cm suspicious for mild fecal impaction. Again noted status post partial colectomy. There is significant gaseous distension of the colon in right abdomen just prior to anastomosis. Findings suspicious for colonic ileus or partial colonic obstruction. 3. No hydronephrosis or hydroureter. There is mild dilatation of bilateral extrarenal pelvis. Stable right renal cysts. 4. Status postcholecystectomy. 5. Status post partial gastrectomy. 6. There is thickening of distal esophageal wall highly suspicious for esophagitis. Electronically Signed   By: Lahoma Crocker M.D.   On: 01/10/2015 15:37    ROS: See chart Blood pressure 129/80, pulse 83, temperature 98.6 F (37 C), temperature source Oral, resp. rate 18, height _0  (1.753 m), weight 66.407 kg (146 lb 6.4 oz), SpO2 98 %. Physical Exam: Pleasant white male in no acute distress. Abdomen is soft with slight distention noted. Minimal bowel sounds appreciated. No point tenderness noted.  Assessment/Plan: Impression: Partial bowel obstruction in the face of bowel dysmotility/atony. Plan: No need for NG tube at this time. Will add Muralax. We'll follow with you.  [name] A 01/11/2015, 9:26 AM

## 2015-01-12 LAB — COMPREHENSIVE METABOLIC PANEL
ALT: 185 U/L — AB (ref 17–63)
AST: 98 U/L — ABNORMAL HIGH (ref 15–41)
Albumin: 2.6 g/dL — ABNORMAL LOW (ref 3.5–5.0)
Alkaline Phosphatase: 126 U/L (ref 38–126)
Anion gap: 5 (ref 5–15)
BILIRUBIN TOTAL: 0.7 mg/dL (ref 0.3–1.2)
BUN: 8 mg/dL (ref 6–20)
CHLORIDE: 111 mmol/L (ref 101–111)
CO2: 24 mmol/L (ref 22–32)
CREATININE: 0.68 mg/dL (ref 0.61–1.24)
Calcium: 6.4 mg/dL — CL (ref 8.9–10.3)
Glucose, Bld: 86 mg/dL (ref 65–99)
POTASSIUM: 3.5 mmol/L (ref 3.5–5.1)
Sodium: 140 mmol/L (ref 135–145)
TOTAL PROTEIN: 4.6 g/dL — AB (ref 6.5–8.1)

## 2015-01-12 LAB — CBC
HEMATOCRIT: 31.3 % — AB (ref 39.0–52.0)
Hemoglobin: 10.2 g/dL — ABNORMAL LOW (ref 13.0–17.0)
MCH: 29.3 pg (ref 26.0–34.0)
MCHC: 32.6 g/dL (ref 30.0–36.0)
MCV: 89.9 fL (ref 78.0–100.0)
PLATELETS: 145 10*3/uL — AB (ref 150–400)
RBC: 3.48 MIL/uL — ABNORMAL LOW (ref 4.22–5.81)
RDW: 17.4 % — AB (ref 11.5–15.5)
WBC: 7.5 10*3/uL (ref 4.0–10.5)

## 2015-01-12 MED ORDER — PROMETHAZINE HCL 25 MG/ML IJ SOLN
12.5000 mg | Freq: Four times a day (QID) | INTRAMUSCULAR | Status: DC | PRN
  Administered 2015-01-12 – 2015-01-13 (×3): 12.5 mg via INTRAVENOUS
  Filled 2015-01-12 (×4): qty 1

## 2015-01-12 NOTE — Progress Notes (Signed)
[facility] PROGRESS NOTE  [name] YJE:[id number] DOB: [birth year] DOA: 01/10/2015 PCP: [name], MD  Assessment/Plan:  Partial small bowel obstruction in the setting of bowel dysmotility/atony with 14 prior abdominal surgeries. - Continue IV fluids and NPO. - Surgery following. No indication for NG tube at this time. Continue Miralax per surgery.  - He does not feel ready to advance diet today. Still nauseous.  Transaminitis - Improved from yesterday however still significantly elevated -Etiology unclear. Hx of alcohol abuse, in remission since 2012, ?related to colon cancer. - RUQ [place]: prior cholecystectomy, 11 mm common duct dilatation, possibly post-surgical.  Adenocarcinoma of the colon. -Followed by oncology. Under surveillance.  Hypertension. - Stable. Continue IV hydralazine as needed for blood pressure control.  History of PE.  - Noted   Code Status: full  Family Communication: No family at bedside. Discussed with patient who understands and has no concerns at this time. Disposition Plan: Anticipate discharge when improved.     Consultants:  General surgery- Dr. Arnoldo Morale  Antibiotics:    Subjective:  Still feels bloated. Is still very nauseated. No emesis since admission.   Objective: Filed Vitals:   01/11/15 1520 01/11/15 2101 01/12/15 0443 01/12/15 1258  BP: 120/84 147/87 156/82 140/77  Pulse: 92 91 92 80  Temp: 98.6 F (37 C) 98.2 F (36.8 C) 99.1 F (37.3 C) 98.9 F (37.2 C)  TempSrc: Oral Oral Oral Oral  Resp: '18 18 16 16  '$ Height:      Weight:      SpO2: 97% 97% 98% 97%    Intake/Output Summary (Last 24 hours) at 01/12/15 1308 Last data filed at 01/12/15 1200  Gross per 24 hour  Intake   1045 ml  Output   3250 ml  Net  -2205 ml   Filed Weights   01/10/15 1245 01/10/15 1839  Weight: 65.318 kg (144 lb) 66.407 kg (146 lb 6.4 oz)    Exam:   General:  AA Ox3  Cardiovascular: RRR  Respiratory: CTA  B  Abdomen:  S/NT/ND/ +BS   Extremities: no C/C/E  Neurologic:  Grossly intact and non-focal  Data Reviewed: Basic Metabolic Panel:  Recent Labs Lab 01/10/15 1408 01/11/15 0616 01/12/15 0614  NA 139 138 140  K 4.0 4.1 3.5  CL 104 104 111  CO2 '26 27 24  '$ GLUCOSE 88 83 86  BUN 22* 15 8  CREATININE 1.18 0.94 0.68  CALCIUM 8.1* 7.4* 6.4*   Liver Function Tests:  Recent Labs Lab 01/10/15 1408 01/11/15 0616 01/12/15 0614  AST 1462* 384* 98*  ALT 593* 371* 185*  ALKPHOS 146* 176* 126  BILITOT 0.7 0.9 0.7  PROT 6.2* 5.5* 4.6*  ALBUMIN 3.6 3.1* 2.6*    Recent Labs Lab 01/10/15 1408  LIPASE 32    CBC:  Recent Labs Lab 01/10/15 1408 01/11/15 0616 01/12/15 0614  WBC 7.9 11.2* 7.5  NEUTROABS 4.7  --   --   HGB 12.7* 12.0* 10.2*  HCT 38.2* 36.6* 31.3*  MCV 88.2 89.3 89.9  PLT 192 169 145*      Studies: Ct Abdomen Pelvis W Contrast  01/10/2015  CLINICAL DATA:  Abdominal pain starting last night, status post partial gastrectomy,, esophagitis, status post hernia repair, appendectomy, cholecystectomy. EXAM: CT ABDOMEN AND PELVIS WITH CONTRAST TECHNIQUE: Multidetector CT imaging of the abdomen and pelvis was performed using the standard protocol following bolus administration of intravenous contrast. CONTRAST:  171m OMNIPAQUE IOHEXOL 300 MG/ML  SOLN COMPARISON:  11/29/2014 FINDINGS: There  is diffuse osteopenia. Lung bases are unremarkable. There is thickening of distal esophageal wall highly suspicious for esophagitis. The patient is status post partial gastrectomy. Enhanced liver is unremarkable. Enhanced pancreas, spleen and adrenal glands are unremarkable. No aortic aneurysm. Atherosclerotic calcifications are noted bilateral renal artery origin. Atherosclerotic calcifications are noted bilateral common iliac arteries. Kidneys are symmetrical in size and enhancement. No hydronephrosis or hydroureter. There is nonobstructive calcified calculus in midpole of the left  kidney measures 2.7 mm. Right renal cysts are noted the largest in midpole measures 3.9 cm. Delayed renal images shows bilateral renal symmetrical excretion. There is bilateral mild dilatation of extrarenal pelvis without frank hydronephrosis. Again noted status post partial colectomy. Again noted anastomosis in the region of mid sigmoid colon. There is moderate stool within rectum. The rectum measures 7.3 cm in diameter suspicious for mild fecal impaction. There is significant gaseous distension of the colon in mid pelvis and right lower quadrant. Findings highly suspicious for colonic ileus. There are gaseous distended small bowel loops in right abdomen. Axial image 11 mild thickening of small bowel wall in left abdomen. Mild enteritis is suspected. Best seen in axial image 26. Some small bowel air-fluid levels are noted in right abdomen please see axial image 14. Findings are suspicious for significant small bowel ileus or early small bowel obstruction. No any contrast material is noted in distal small bowel. Prostate gland and seminal vesicles are unremarkable. The urinary bladder is unremarkable. The patient is status post cholecystectomy. IMPRESSION: 1. There are gaseous distended small bowel loops in right abdomen with some air-fluid levels. Findings suspicious for ileus or early small bowel obstruction. There is some thickening of small bowel wall in left abdomen suspicious for enteritis. 2. Moderate distension of the rectum with stool up to 7.3 cm suspicious for mild fecal impaction. Again noted status post partial colectomy. There is significant gaseous distension of the colon in right abdomen just prior to anastomosis. Findings suspicious for colonic ileus or partial colonic obstruction. 3. No hydronephrosis or hydroureter. There is mild dilatation of bilateral extrarenal pelvis. Stable right renal cysts. 4. Status postcholecystectomy. 5. Status post partial gastrectomy. 6. There is thickening of distal  esophageal wall highly suspicious for esophagitis. Electronically Signed   By: Lahoma Crocker M.D.   On: 01/10/2015 15:37   US Abdomen Limited Ruq  01/11/2015  CLINICAL DATA:  Transaminitis status post cholecystectomy, chronic abdominal pain, history of stage III colonic adenocarcinoma EXAM: US ABDOMEN LIMITED - RIGHT UPPER QUADRANT COMPARISON:  CT abdomen pelvis dated 01/10/2015 FINDINGS: Gallbladder: Surgically absent. Common bile duct: Diameter: Fusiform dilatation of the proximal duct measuring up to 11 mm. Liver: No focal lesion identified. Very mild nodular contour of the left hepatic lobe, nonspecific. Within the upper limits of normal for parenchymal echogenicity. IMPRESSION: Status post cholecystectomy. Mild fusiform dilatation of the common duct, measuring up to 11 mm, although possibly postsurgical. No choledocholithiasis is evident on ultrasound. Electronically Signed   By: Julian Hy M.D.   On: 01/11/2015 12:46    Scheduled Meds: . antiseptic oral rinse  7 mL Mouth Rinse q12n4p  . chlorhexidine  15 mL Mouth Rinse BID  . enoxaparin  60 mg Subcutaneous Q24H  . polyethylene glycol  17 g Oral BID   Continuous Infusions: . sodium chloride 100 mL/hr at 01/12/15 5852    Active Problems:   Adenocarcinoma of colon with mucinous features   Erosive esophagitis   Small bowel obstruction ([facility])   Partial small bowel obstruction ([place])  SBO (small bowel obstruction) ([facility])   Essential hypertension    Time spent: 20 minutes. Greater than 50% of this time was spent in direct contact with the patient coordinating care.  Domingo Mend, MD [facility] Pager [phone]  If 7PM-7AM, please contact night-coverage at [email or web address], password [facility] 01/12/2015, 1:08 PM  LOS: 2 days

## 2015-01-13 LAB — COMPREHENSIVE METABOLIC PANEL
ALK PHOS: 128 U/L — AB (ref 38–126)
ALT: 146 U/L — AB (ref 17–63)
AST: 53 U/L — AB (ref 15–41)
Albumin: 2.9 g/dL — ABNORMAL LOW (ref 3.5–5.0)
Anion gap: 4 — ABNORMAL LOW (ref 5–15)
BILIRUBIN TOTAL: 0.6 mg/dL (ref 0.3–1.2)
BUN: 5 mg/dL — AB (ref 6–20)
CALCIUM: 7.7 mg/dL — AB (ref 8.9–10.3)
CO2: 27 mmol/L (ref 22–32)
Chloride: 108 mmol/L (ref 101–111)
Creatinine, Ser: 0.82 mg/dL (ref 0.61–1.24)
Glucose, Bld: 103 mg/dL — ABNORMAL HIGH (ref 65–99)
Potassium: 4 mmol/L (ref 3.5–5.1)
Sodium: 139 mmol/L (ref 135–145)
Total Protein: 5.4 g/dL — ABNORMAL LOW (ref 6.5–8.1)

## 2015-01-13 LAB — HEPATITIS PANEL, ACUTE
HEP A IGM: POSITIVE — AB
HEP B C IGM: NEGATIVE
Hepatitis B Surface Ag: NEGATIVE

## 2015-01-13 LAB — CBC
HCT: 36.6 % — ABNORMAL LOW (ref 39.0–52.0)
Hemoglobin: 11.9 g/dL — ABNORMAL LOW (ref 13.0–17.0)
MCH: 29.4 pg (ref 26.0–34.0)
MCHC: 32.5 g/dL (ref 30.0–36.0)
MCV: 90.4 fL (ref 78.0–100.0)
PLATELETS: 152 10*3/uL (ref 150–400)
RBC: 4.05 MIL/uL — AB (ref 4.22–5.81)
RDW: 17.3 % — AB (ref 11.5–15.5)
WBC: 8.7 10*3/uL (ref 4.0–10.5)

## 2015-01-13 MED ORDER — HEPARIN SOD (PORK) LOCK FLUSH 100 UNIT/ML IV SOLN
500.0000 [IU] | Freq: Once | INTRAVENOUS | Status: DC
  Filled 2015-01-13: qty 5

## 2015-01-13 NOTE — Discharge Summary (Signed)
Physician Discharge Summary  [name] OFB:[id number] DOB: March 11, 1951 DOA: 01/10/2015  PCP: [name], MD  Admit date: 01/10/2015 Discharge date: 01/13/2015  Time spent: 45 minutes  Recommendations for Outpatient Follow-up:  -Will be discharged home today. -Advised to follow up with PCP in 2 weeks. -Advance diet slowly at home.   Discharge Diagnoses:  Active Problems:   Adenocarcinoma of colon with mucinous features   Erosive esophagitis   Small bowel obstruction (HCC)   Partial small bowel obstruction (HCC)   SBO (small bowel obstruction) ([place])   Essential hypertension   Discharge Condition: Stable and improved  Filed Weights   01/10/15 1245 01/10/15 1839  Weight: 65.318 kg (144 lb) 66.407 kg (146 lb 6.4 oz)    History of present illness:  As per Dr. Anastasio Champion 10/21: This is a 64 year old man who has history of colon cancer and is currently followed by oncology undergoing chemotherapy, he is status post surgery. He now presents with recurrent symptoms of abdominal pain, mostly on the left side associated with nausea and vomiting. He has had no fever or dyspnea. He has passed gas but no stool. The patient has had admissions in the past for small bowel obstructions. Evaluation emergency room showed CT scan findings of distended small bowel loops in the right abdomen with air-fluid levels, suspicious for possible small bowel obstruction but also some thickening in the small bowel in the left abdomen suspicious for enteritis. He is now being admitted for further management.  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 - Passing flatus, no stool yet. - Seen by surgery who states he can DC home today and advance diet at home. - Tolerating clears without incident.  Transaminitis - Improved from yesterday however still significantly elevated AST 53 and ALT 146 -Etiology unclear. Hx  of alcohol abuse, in remission since 2012, ?related to colon cancer. - RUQ [place]: prior cholecystectomy, 11 mm common duct dilatation, possibly post-surgical.  Adenocarcinoma of the colon. -Followed by oncology. Under surveillance.  Hypertension. - Stable.  - Continue home meds  History of PE.  - Noted - Continue lovenox  Procedures:  None   Consultations:  Surgery, Dr. Arnoldo Morale  Discharge Instructions  Discharge Instructions    Increase activity slowly    Complete by:  As directed             Medication List    TAKE these medications        alendronate 70 MG tablet  Commonly known as:  FOSAMAX  Take 70 mg by mouth every 7 (seven) days. On sundays     atorvastatin 20 MG tablet  Commonly known as:  LIPITOR  Take 20 mg by mouth daily.     BENEFIBER PO  Take 1 each by mouth daily as needed (to regulate bowel movements/for supplement).     cholecalciferol 1000 UNITS tablet  Commonly known as:  VITAMIN D  Take 1,000 Units by mouth every morning.     cyanocobalamin 1000 MCG/ML injection  Commonly known as:  (VITAMIN B-12)  Inject 1,000 mcg into the muscle every 30 (thirty) days.     cyclobenzaprine 10 MG tablet  Commonly known as:  FLEXERIL  Take 10 mg by mouth daily as needed for muscle spasms.     dexlansoprazole 60 MG capsule  Commonly known as:  DEXILANT  Take 1 capsule (60 mg total) by mouth every morning.     docusate  sodium 100 MG capsule  Commonly known as:  COLACE  Take 100 mg by mouth 2 (two) times daily.     enoxaparin 60 MG/0.6ML injection  Commonly known as:  LOVENOX  Inject 60 mg into the skin every evening.     ENSURE PLUS Liqd  Take 237 mLs by mouth daily. Every other day     FEROCON capsule  Generic drug:  ferrous [id number] C-folic acid  Take 1 capsule by mouth daily.     ferrous sulfate 325 (65 FE) MG tablet  Take 325 mg by mouth daily with breakfast.     folic acid 1 MG tablet  Commonly known as:  FOLVITE  Take 1  mg by mouth every morning.     lidocaine-prilocaine cream  Commonly known as:  EMLA  Apply 1 application topically as needed.     LORazepam 1 MG tablet  Commonly known as:  ATIVAN  Take 1 mg by mouth every 4 (four) hours as needed. anxiety     Melatonin 10 MG Caps  Take 10 mg by mouth at bedtime as needed (for sleep).     metoCLOPramide 10 MG tablet  Commonly known as:  REGLAN  Take 1 tablet (10 mg total) by mouth every 6 (six) hours as needed for nausea.     multivitamins ther. w/minerals Tabs tablet  Take 1 tablet by mouth daily.     ondansetron 4 MG tablet  Commonly known as:  ZOFRAN  Take 1 tablet (4 mg total) by mouth every 8 (eight) hours as needed for nausea.     polyethylene glycol powder powder  Commonly known as:  GLYCOLAX/MIRALAX  Take 17 g by mouth as needed. For constipation     PROAIR HFA 108 (90 BASE) MCG/ACT inhaler  Generic drug:  albuterol  Inhale into the lungs every 6 (six) hours as needed for wheezing or shortness of breath.     PROBIOTIC DAILY Caps  Take 1 capsule by mouth daily.     prochlorperazine 10 MG tablet  Commonly known as:  COMPAZINE  Take 10 mg by mouth every 6 (six) hours as needed. Nausea and Vomiting     psyllium 0.52 G capsule  Commonly known as:  REGULOID  Take 5 capsules by mouth 2 (two) times daily.     sertraline 100 MG tablet  Commonly known as:  ZOLOFT  Take 150 mg by mouth at bedtime.     sucralfate 1 G tablet  Commonly known as:  CARAFATE  Take 1 g by mouth 4 (four) times daily.     thiamine 100 MG tablet  Take 100 mg by mouth every morning.     traMADol 50 MG tablet  Commonly known as:  ULTRAM  Take 50 mg by mouth every 6 (six) hours as needed for moderate pain or severe pain.     valsartan-hydrochlorothiazide 80-12.5 MG tablet  Commonly known as:  DIOVAN-HCT  Take 1 tablet by mouth every morning.     Vitamin D (Ergocalciferol) 50000 UNITS Caps capsule  Commonly known as:  DRISDOL  Take 50,000 Units by mouth  once a week. Takes on Sunday       No Known Allergies     Follow-up Information    Follow up with CAPLAN,Syrus, MD. Schedule an appointment as soon as possible for a visit in 2 weeks.   Specialty:  Internal Medicine   Contact information:   [address] [place] [address] [phone]  The results of significant diagnostics from this hospitalization (including imaging, microbiology, ancillary and laboratory) are listed below for reference.    Significant Diagnostic Studies: Ct Abdomen Pelvis W Contrast  01/10/2015  CLINICAL DATA:  Abdominal pain starting last night, status post partial gastrectomy,, esophagitis, status post hernia repair, appendectomy, cholecystectomy. EXAM: CT ABDOMEN AND PELVIS WITH CONTRAST TECHNIQUE: Multidetector CT imaging of the abdomen and pelvis was performed using the standard protocol following bolus administration of intravenous contrast. CONTRAST:  157m OMNIPAQUE IOHEXOL 300 MG/ML  SOLN COMPARISON:  11/29/2014 FINDINGS: There is diffuse osteopenia. Lung bases are unremarkable. There is thickening of distal esophageal wall highly suspicious for esophagitis. The patient is status post partial gastrectomy. Enhanced liver is unremarkable. Enhanced pancreas, spleen and adrenal glands are unremarkable. No aortic aneurysm. Atherosclerotic calcifications are noted bilateral renal artery origin. Atherosclerotic calcifications are noted bilateral common iliac arteries. Kidneys are symmetrical in size and enhancement. No hydronephrosis or hydroureter. There is nonobstructive calcified calculus in midpole of the left kidney measures 2.7 mm. Right renal cysts are noted the largest in midpole measures 3.9 cm. Delayed renal images shows bilateral renal symmetrical excretion. There is bilateral mild dilatation of extrarenal pelvis without frank hydronephrosis. Again noted status post partial colectomy. Again noted anastomosis in the region of mid sigmoid  colon. There is moderate stool within rectum. The rectum measures 7.3 cm in diameter suspicious for mild fecal impaction. There is significant gaseous distension of the colon in mid pelvis and right lower quadrant. Findings highly suspicious for colonic ileus. There are gaseous distended small bowel loops in right abdomen. Axial image 11 mild thickening of small bowel wall in left abdomen. Mild enteritis is suspected. Best seen in axial image 26. Some small bowel air-fluid levels are noted in right abdomen please see axial image 14. Findings are suspicious for significant small bowel ileus or early small bowel obstruction. No any contrast material is noted in distal small bowel. Prostate gland and seminal vesicles are unremarkable. The urinary bladder is unremarkable. The patient is status post cholecystectomy. IMPRESSION: 1. There are gaseous distended small bowel loops in right abdomen with some air-fluid levels. Findings suspicious for ileus or early small bowel obstruction. There is some thickening of small bowel wall in left abdomen suspicious for enteritis. 2. Moderate distension of the rectum with stool up to 7.3 cm suspicious for mild fecal impaction. Again noted status post partial colectomy. There is significant gaseous distension of the colon in right abdomen just prior to anastomosis. Findings suspicious for colonic ileus or partial colonic obstruction. 3. No hydronephrosis or hydroureter. There is mild dilatation of bilateral extrarenal pelvis. Stable right renal cysts. 4. Status postcholecystectomy. 5. Status post partial gastrectomy. 6. There is thickening of distal esophageal wall highly suspicious for esophagitis. Electronically Signed   By: LLahoma CrockerM.D.   On: 01/10/2015 15:37   [place]dom[place] Limited Ruq  01/11/2015  CLINICAL DATA:  Transaminitis status post cholecystectomy, chronic abdominal pain, history of stage III colonic adenocarcinoma EXAM: [place]DOM[place] LIMITED - RIGHT UPPER QUADRANT  COMPARISON:  CT abdomen pelvis dated 01/10/2015 FINDINGS: Gallbladder: Surgically absent. Common bile duct: Diameter: Fusiform dilatation of the proximal duct measuring up to 11 mm. Liver: No focal lesion identified. Very mild nodular contour of the left hepatic lobe, nonspecific. Within the upper limits of normal for parenchymal echogenicity. IMPRESSION: Status post cholecystectomy. Mild fusiform dilatation of the common duct, measuring up to 11 mm, although possibly postsurgical. No choledocholithiasis is evident on ultrasound. Electronically Signed  By: Julian Hy M.D.   On: 01/11/2015 12:46    Microbiology: No results found for this or any previous visit (from the past 240 hour(s)).   Labs: Basic Metabolic Panel:  Recent Labs Lab 01/10/15 1408 01/11/15 0616 01/12/15 0614 01/13/15 0612  NA 139 138 140 139  K 4.0 4.1 3.5 4.0  CL 104 104 111 108  CO2 '26 27 24 27  '$ GLUCOSE 88 83 86 103*  BUN 22* 15 8 5*  CREATININE 1.18 0.94 0.68 0.82  CALCIUM 8.1* 7.4* 6.4* 7.7*   Liver Function Tests:  Recent Labs Lab 01/10/15 1408 01/11/15 0616 01/12/15 0614 01/13/15 0612  AST 1462* 384* 98* 53*  ALT 593* 371* 185* 146*  ALKPHOS 146* 176* 126 128*  BILITOT 0.7 0.9 0.7 0.6  PROT 6.2* 5.5* 4.6* 5.4*  ALBUMIN 3.6 3.1* 2.6* 2.9*    Recent Labs Lab 01/10/15 1408  LIPASE 32   No results for input(s): AMMONIA in the last 168 hours. CBC:  Recent Labs Lab 01/10/15 1408 01/11/15 0616 01/12/15 0614 01/13/15 0612  WBC 7.9 11.2* 7.5 8.7  NEUTROABS 4.7  --   --   --   HGB 12.7* 12.0* 10.2* 11.9*  HCT 38.2* 36.6* 31.3* 36.6*  MCV 88.2 89.3 89.9 90.4  PLT 192 169 145* 152   Cardiac Enzymes: No results for input(s): CKTOTAL, CKMB, CKMBINDEX, TROPONINI in the last 168 hours. BNP: BNP (last 3 results) No results for input(s): BNP in the last 8760 hours.  ProBNP (last 3 results) No results for input(s): PROBNP in the last 8760 hours.  CBG: No results for input(s): GLUCAP  in the last 168 hours.     SignedLelon Frohlich  [facility] Pager: [phone] 01/13/2015, 2:34 PM

## 2015-01-13 NOTE — Care Management Note (Signed)
Case Management Note  Patient Details  Name: [name] MRN: [id number] Date of Birth: 11-05-1950  Subjective/Objective:                  Pt admitted from home with SBO. Pt lives with his wife and will return home at discharge. Pt is independent with ADL's.   Action/Plan: Anticipate discharge within 24 hours. No CM needs anticipated.  Expected Discharge Date:  01/14/15               Expected Discharge Plan:  Home/Self Care  In-House Referral:  NA  Discharge planning Services  CM Consult  Post Acute Care Choice:  NA Choice offered to:  NA  DME Arranged:    DME Agency:     HH Arranged:    HH Agency:     Status of Service:  Completed, signed off  Medicare Important Message Given:    Date Medicare IM Given:    Medicare IM give by:    Date Additional Medicare IM Given:    Additional Medicare Important Message give by:     If discussed at [place] of Stay Meetings, dates discussed:    Additional Comments:  Joylene Draft, RN 01/13/2015, 10:40 AM

## 2015-01-13 NOTE — Progress Notes (Signed)
  Subjective: Patient states that he feels better and his bowels started moving.  Objective: Vital signs in last 24 hours: Temp:  [98.3 F (36.8 C)-98.9 F (37.2 C)] 98.3 F (36.8 C) (10/24 0618) Pulse Rate:  [71-80] 71 (10/24 0618) Resp:  [16] 16 (10/24 0618) BP: (121-146)/(77-94) 146/88 mmHg (10/24 0618) SpO2:  [97 %-99 %] 99 % (10/24 0618) Last BM Date: 01/12/15  Intake/Output from previous day: 10/23 0701 - 10/24 0700 In: 0  Out: 1950 [Urine:1950] Intake/Output this shift:    General appearance: alert, cooperative and no distress GI: soft, non-tender; bowel sounds normal; no masses,  no organomegaly  Lab Results:   Recent Labs  01/11/15 0616 01/12/15 0614  WBC 11.2* 7.5  HGB 12.0* 10.2*  HCT 36.6* 31.3*  PLT 169 145*   BMET  Recent Labs  01/11/15 0616 01/12/15 0614  NA 138 140  K 4.1 3.5  CL 104 111  CO2 27 24  GLUCOSE 83 86  BUN 15 8  CREATININE 0.94 0.68  CALCIUM 7.4* 6.4*   PT/INR No results for input(s): LABPROT, INR in the last 72 hours.  Studies/Results: US Abdomen Limited Ruq  01/11/2015  CLINICAL DATA:  Transaminitis status post cholecystectomy, chronic abdominal pain, history of stage III colonic adenocarcinoma EXAM: US ABDOMEN LIMITED - RIGHT UPPER QUADRANT COMPARISON:  CT abdomen pelvis dated 01/10/2015 FINDINGS: Gallbladder: Surgically absent. Common bile duct: Diameter: Fusiform dilatation of the proximal duct measuring up to 11 mm. Liver: No focal lesion identified. Very mild nodular contour of the left hepatic lobe, nonspecific. Within the upper limits of normal for parenchymal echogenicity. IMPRESSION: Status post cholecystectomy. Mild fusiform dilatation of the common duct, measuring up to 11 mm, although possibly postsurgical. No choledocholithiasis is evident on ultrasound. Electronically Signed   By: Julian Hy M.D.   On: 01/11/2015 12:46    Anti-infectives: Anti-infectives    Start     Dose/Rate Route Frequency Ordered  Stop   01/10/15 2100  metroNIDAZOLE (FLAGYL) IVPB 500 mg  Status:  Discontinued     500 mg 100 mL/hr over 60 Minutes Intravenous Every 8 hours 01/10/15 1841 01/11/15 1042   01/10/15 2000  ciprofloxacin (CIPRO) IVPB 400 mg  Status:  Discontinued     400 mg 200 mL/hr over 60 Minutes Intravenous Every 12 hours 01/10/15 1841 01/11/15 1042      Assessment/Plan: Impression: Partial small bowel obstruction/dysmotility, resolving Plan: I have told patient that he can be discharged and advance his diet at home. He has done this many times before as he does eat a modified diet.  LOS: 3 days    Stevan Eberwein A 01/13/2015

## 2015-01-13 NOTE — Care Management Note (Signed)
Case Management Note  Patient Details  Name: [name] MRN: [id number] Date of Birth: [birth year]  Subjective/Objective:                    Action/Plan:   Expected Discharge Date:  01/14/15               Expected Discharge Plan:  Home/Self Care  In-House Referral:  NA  Discharge planning Services  CM Consult  Post Acute Care Choice:  NA Choice offered to:  NA  DME Arranged:    DME Agency:     HH Arranged:    [place] Agency:     Status of Service:  Completed, signed off  Medicare Important Message Given:  Yes-second notification given Date Medicare IM Given:    Medicare IM give by:    Date Additional Medicare IM Given:    Additional Medicare Important Message give by:     If discussed at [place] of Stay Meetings, dates discussed:    Additional Comments: Pt discharged home today. No CM needs noted. Christinia Gully [place], RN 01/13/2015, 3:29 PM

## 2015-01-13 NOTE — Final Progress Note (Signed)
Patient discharged with instructions, prescription, and care notes.  Verbalized understanding via teach back.  IV was removed and the site was WNL. Patient voiced no further complaints or concerns at the time of discharge.  Appointments scheduled per instructions.  Patient left the floor via w/c with staff and family in stable condition. 

## 2015-01-13 NOTE — Care Management Important Message (Signed)
Important Message  Patient Details  Name: [name] MRN: [id number] Date of Birth: 11-09-1950   Medicare Important Message Given:  Yes-second notification given    Joylene Draft, RN 01/13/2015, 3:28 PM

## 2015-02-20 ENCOUNTER — Encounter (HOSPITAL_COMMUNITY): Payer: Federal, State, Local not specified - PPO

## 2015-02-20 ENCOUNTER — Ambulatory Visit (HOSPITAL_COMMUNITY)
Admission: RE | Admit: 2015-02-20 | Discharge: 2015-02-20 | Disposition: A | Discharge status: Home / Self Care | Payer: Medicare Other | Source: Ambulatory Visit | Attending: Oncology | Admitting: Oncology

## 2015-02-20 DIAGNOSIS — C189 Malignant neoplasm of colon, unspecified: Secondary | ICD-10-CM

## 2015-02-20 DIAGNOSIS — N200 Calculus of kidney: Secondary | ICD-10-CM | POA: Insufficient documentation

## 2015-02-20 DIAGNOSIS — Z9049 Acquired absence of other specified parts of digestive tract: Secondary | ICD-10-CM | POA: Insufficient documentation

## 2015-02-20 DIAGNOSIS — N281 Cyst of kidney, acquired: Secondary | ICD-10-CM | POA: Diagnosis not present

## 2015-02-20 DIAGNOSIS — Z08 Encounter for follow-up examination after completed treatment for malignant neoplasm: Secondary | ICD-10-CM | POA: Diagnosis present

## 2015-02-20 DIAGNOSIS — Z9221 Personal history of antineoplastic chemotherapy: Secondary | ICD-10-CM | POA: Diagnosis not present

## 2015-02-20 MED ORDER — IOHEXOL 300 MG/ML  SOLN
100.0000 mL | Freq: Once | INTRAMUSCULAR | Status: AC | PRN
  Administered 2015-02-20: 100 mL via INTRAVENOUS

## 2015-03-04 HISTORY — PX: ABDOMINAL ADHESION SURGERY: SHX90

## 2015-03-12 NOTE — Progress Notes (Signed)
[name] Cipro, MD [address] [place] [address]  Adenocarcinoma of colon with mucinous features - Plan: CBC with Differential, Comprehensive metabolic panel, CEA, heparin lock flush 100 unit/mL, sodium chloride 0.9 % injection 10 mL, CBC with Differential, Comprehensive metabolic panel, CEA  Anemia, chronic disease - Plan: Vitamin B12, Folate, Iron and TIBC, Ferritin, Vitamin B12, Folate, Iron and TIBC, Ferritin  Other fatigue - Plan: TSH, Vitamin D 25 hydroxy, TSH, Vitamin D 25 hydroxy  CURRENT THERAPY: Surveillance per NCCN guidelines  INTERVAL HISTORY: [name] 64 y.o. 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 AND He has a hisory of PE in November 2011 and December 2012. He is on lovenox which is managed by his primary care provider, Dr. 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    Chart is reviewed.  Hospitalizations noted, not oncology related.  I personally reviewed and went over laboratory results with the patient.  The results are noted within this dictation.  They will be updated today.  I personally reviewed and went over radiographic studies with the patient.  The results are noted within this dictation. No evidence of malignancy on CT abd/pelvis in December 2016.   He reports a new medication.  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  The first occurrence happened at a [employer].  He was walking with his wife and everything started to spin.  He went to tell his wife, who was walking next to him that "something was wrong" and the next this he remember is being in the back of an ambulance.  He has been seeing a neurologist in [name], [place].  He notes that this clinic is about 1 hours away  form his house.  He notes fatigue.  This has been ongoing for 2-3 months.  He reports that it pre-dates his Keppra medication.  He denies any unintentional weight loss.  He denies any bowel changes, blood in stool, black tarry stool.  He denies any new changes in his appetite and denies any nausea/vomiting.  I reviewed the NCCN guidelines pertaining to surveillance.  He is nearing his 5 year anniversary.  We will continue to follow guidelines.  He is concerned about recurrence.  We discussed signs and symptoms of recurrent disease.     Past Medical History  Diagnosis Date  . Pulmonary embolism ([place]) 02/2010  . Hypertension   . Osteoporosis   . Arthritis   . TIA (transient ischemic attack) 10/11  . ETOH abuse     quit 03/2010  . 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 Personal history of PE (pulmonary embolism) 10/01/2010  . Blood transfusion   . S/P endoscopy September 28, 2010    erosive reflux esophagitis, Billroth I anatomy  . Shortness of breath   . Sleep apnea   . Recurrent upper respiratory infection (URI)   . Anxiety   . Pneumonia   . Anemia   . Ileus ([place])   . Chronic abdominal pain   . GERD (gastroesophageal reflux disease)   . Chest pain at rest   . Erosive esophagitis   . Vitamin B12 deficiency   . Bowel obstruction ([place]) 05/13/2012    Recurrent  .  Hx of Clostridium difficile infection 01/2012  . Bronchitis   . 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 Obstruction of bowel ([place]) 03/03/14  . Seizures ([place])     has Adenocarcinoma of colon with mucinous features; Bleeding gastrointestinal; Supratherapeutic INR; Abdominal pain, other specified site; Nausea and vomiting in adult; Anemia, chronic disease; Fever chills; S/P partial gastrectomy; Personal history of PE (pulmonary embolism); Bronchitis, acute; Erosive esophagitis; Esophageal reflux disease; Coffee ground emesis; Hematemesis; Chronic abdominal pain; Pancytopenia due to antineoplastic chemotherapy  ([place]); Pneumonia; Pulmonary embolism ([place]); Chest pain at rest; Small bowel obstruction ([place]); Left sided abdominal pain; GI bleed; Coagulopathy ([name]); Gastroenteritis; Partial small bowel obstruction ([place]); Nausea; Abdominal distension; HTN (hypertension); Diarrhea; Cellulitis; Esophageal dysphagia; H/O Clostridium difficile infection; Rectal bleeding; Bowel obstruction ([place]); Abnormal CT scan, colon; SBO (small bowel obstruction) ([place]); Dysphagia, pharyngoesophageal phase; Mucosal abnormality of esophagus; and Essential hypertension on his problem list.     has No Known Allergies.  Current Outpatient Prescriptions on File Prior to Visit  Medication Sig Dispense Refill  . albuterol (PROAIR HFA) 108 (90 BASE) MCG/ACT inhaler Inhale into the lungs every 6 (six) hours as needed for wheezing or shortness of breath.    [name] alendronate (FOSAMAX) 70 MG tablet Take 70 mg by mouth every 7 (seven) days. On sundays    . atorvastatin (LIPITOR) 20 MG tablet Take 20 mg by mouth daily.    . cholecalciferol (VITAMIN D) 1000 UNITS tablet Take 1,000 Units by mouth every morning.    . cyanocobalamin (,VITAMIN B-12,) 1000 MCG/ML injection Inject 1,000 mcg into the muscle every 30 (thirty) days.    . cyclobenzaprine (FLEXERIL) 10 MG tablet Take 10 mg by mouth daily as needed for muscle spasms.     [name] dexlansoprazole (DEXILANT) 60 MG capsule Take 1 capsule (60 mg total) by mouth every morning. 30 capsule 5  . docusate sodium (COLACE) 100 MG capsule Take 100 mg by mouth 2 (two) times daily.    [name] enoxaparin (LOVENOX) 60 MG/0.6ML injection Inject 60 mg into the skin every evening.     [name] ENSURE PLUS (ENSURE PLUS) LIQD Take 237 mLs by mouth daily. Every other day    . ferrous [id number] C-folic acid (FEROCON) capsule Take 1 capsule by mouth daily.    . ferrous sulfate 325 (65 FE) MG tablet Take 325 mg by mouth daily with breakfast.    . folic acid (FOLVITE) 1 MG tablet Take 1 mg by mouth every morning.     .  lidocaine-prilocaine (EMLA) cream Apply 1 application topically as needed. (Patient taking differently: Apply 1 application topically as needed (for port access). ) 30 g 3  . LORazepam (ATIVAN) 1 MG tablet Take 1 mg by mouth every 4 (four) hours as needed. anxiety    . Melatonin 10 MG CAPS Take 10 mg by mouth at bedtime as needed (for sleep).     . Multiple Vitamins-Minerals (MULTIVITAMINS THER. W/MINERALS) TABS tablet Take 1 tablet by mouth daily.    . ondansetron (ZOFRAN) 4 MG tablet Take 1 tablet (4 mg total) by mouth every 8 (eight) hours as needed for nausea. 20 tablet 1  . polyethylene glycol powder (GLYCOLAX/MIRALAX) powder Take 17 g by mouth as needed. For constipation    . Probiotic Product (PROBIOTIC DAILY) CAPS Take 1 capsule by mouth daily.    . prochlorperazine (COMPAZINE) 10 MG tablet Take 10 mg by mouth every 6 (six) hours as needed. Nausea and Vomiting    .  psyllium (REGULOID) 0.52 G capsule Take 5 capsules by mouth 2 (two) times daily.     . sertraline (ZOLOFT) 100 MG tablet Take 150 mg by mouth at bedtime.     . sucralfate (CARAFATE) 1 G tablet Take 1 g by mouth 4 (four) times daily.    [name] thiamine 100 MG tablet Take 100 mg by mouth every morning.     . traMADol (ULTRAM) 50 MG tablet Take 50 mg by mouth every 6 (six) hours as needed for moderate pain or severe pain.     . valsartan-hydrochlorothiazide (DIOVAN-HCT) 80-12.5 MG per tablet Take 1 tablet by mouth every morning.     . Vitamin D, Ergocalciferol, (DRISDOL) 50000 UNITS CAPS Take 50,000 Units by mouth once a week. Takes on Sunday    . Wheat Dextrin (BENEFIBER PO) Take 1 each by mouth daily as needed (to regulate bowel movements/for supplement).     . metoCLOPramide (REGLAN) 10 MG tablet Take 1 tablet (10 mg total) by mouth every 6 (six) hours as needed for nausea. (Patient not taking: Reported on 03/13/2015) 90 tablet 0   No current facility-administered medications on file prior to visit.    Past Surgical History    Procedure Laterality Date  . Hernia repair      right inguinal  . Appendectomy  1980s  . Cholecystectomy  1980s  . Portacath placement    . Abdominal sugery      for bowel obstruction x 8, all in 1980s, except for one in 07/2010  . Esophagogastroduodenoscopy  09/28/2010  . Esophagogastroduodenoscopy  12/01/2010    Cervical web status post dilation, erosive esophagitis, B1 hemigastrectomy, inflamed anastomosis  . Colonoscopy  03/18/2011    anastomosis at 35cm. Several adenomatous polyps removed. Sigmoid diverticulosis. Next TCS 02/2013  . Esophagogastroduodenoscopy  04/16/2011    excoriation at GEJ c/w trauma/M-W tear, friable gastric anastomosis, dilation efferent limb  . Billroth 1 hemigastrectomy  1980s    per patient for benign duodenal tumor  . Esophagogastroduodenoscopy (egd) with esophageal dilation  02/25/2012    UMP:NTIRWERX esophageal web-s/p dilation anddisruption as described above. Status post prior gastric with Billroth I configuration. Abnormal gastric mucosa at the anastomosis. Gastric biopsy showed mild chronic inflammation but no H. pylori   . Colonoscopy N/A 07/24/2012    VQM:GQQPYP post segmental resection with normal-appearing colonic anastomosis aside from an adjacent polyp-removed as described above. Rectal polyp-removed as described above. CT findings appear to have been artifactual. tubular adenomas/prolapsed type polyp.  . Colon surgery  May 2012    left hemicolectomy, colon cancer found at time of surgery for bowel obstruction  . Cardiac catheterization  07/17/2012  . Abdominal adhesion surgery  03/04/15    @ [facility]  . Esophagogastroduodenoscopy N/A 06/03/2014    Procedure: ESOPHAGOGASTRODUODENOSCOPY (EGD);  Surgeon: [name], MD;  [facility];  Service: Endoscopy;  Laterality: N/A;  2:45  . [place] dilation N/A 06/03/2014    Procedure: SAVORY DILATION;  Surgeon: [name], MD;  Location: [facility];  Service: Endoscopy;  Laterality: N/A;  [name] dilation N/A 06/03/2014    Procedure: [name] DILATION;  Surgeon: [name], MD;  Location: [facility];  Service: Endoscopy;  Laterality: N/A;    Denies any headaches, dizziness, double vision, fevers, chills, night sweats, nausea, vomiting, diarrhea, constipation, chest pain, heart palpitations, shortness of breath, blood in stool, black tarry stool, urinary pain, urinary burning, urinary frequency, hematuria.   PHYSICAL EXAMINATION  ECOG PERFORMANCE STATUS:  1 - Symptomatic but completely ambulatory  Filed Vitals:   03/13/15 1230  BP: 108/74  Pulse: 94  Temp: 98.2 F (36.8 C)  Resp: 15    GENERAL:alert, no distress, well nourished, well developed, comfortable, cooperative and smiling, and unaccompanied. SKIN: skin color, texture, turgor are normal, no rashes or significant lesions HEAD: Normocephalic, No masses, lesions, tenderness or abnormalities EYES: normal, PERRLA, EOMI, Conjunctiva are pink and non-injected EARS: External ears normal OROPHARYNX:lips, buccal mucosa, and tongue normal and mucous membranes are moist  NECK: supple, no adenopathy, thyroid normal size, non-tender, without nodularity, no stridor, non-tender, trachea midline LYMPH:  no palpable lymphadenopathy, no hepatosplenomegaly BREAST:not examined LUNGS: clear to auscultation and percussion HEART: regular rate & rhythm, no murmurs, no gallops, S1 normal and S2 normal ABDOMEN:abdomen soft, normal bowel sounds and no masses or organomegaly BACK: Back symmetric, no curvature. EXTREMITIES:less then 2 second capillary refill, no joint deformities, effusion, or inflammation, no edema, no skin discoloration, no clubbing, no cyanosis  NEURO: alert & oriented x 3 with fluent speech, no focal motor/sensory deficits, gait normal   LABORATORY DATA: CBC    Component Value Date/Time   WBC 8.7 01/13/2015 0612   RBC 4.05* 01/13/2015 0612   RBC 3.21* 12/07/2010 0516   HGB 11.9* 01/13/2015 0612   HCT  36.6* 01/13/2015 0612   PLT 152 01/13/2015 0612   MCV 90.4 01/13/2015 0612   MCH 29.4 01/13/2015 0612   MCHC 32.5 01/13/2015 0612   RDW 17.3* 01/13/2015 0612   LYMPHSABS 2.1 01/10/2015 1408   MONOABS 0.9 01/10/2015 1408   EOSABS 0.1 01/10/2015 1408   BASOSABS 0.0 01/10/2015 1408      Chemistry      Component Value Date/Time   NA 139 01/13/2015 0612   K 4.0 01/13/2015 0612   CL 108 01/13/2015 0612   CO2 27 01/13/2015 0612   BUN 5* 01/13/2015 0612   CREATININE 0.82 01/13/2015 0612      Component Value Date/Time   CALCIUM 7.7* 01/13/2015 0612   ALKPHOS 128* 01/13/2015 0612   AST 53* 01/13/2015 0612   ALT 146* 01/13/2015 0612   BILITOT 0.6 01/13/2015 0612      RADIOGRAPHIC STUDIES:  Ct Abdomen Pelvis W Contrast  02/20/2015  CLINICAL DATA:  Diagnosed with colon carcinoma in May 2012. Treated with surgery and chemotherapy. No current complaints. Colon cancer ([place]) C18.9 (ICD-10-CM) Stage III CRC, annual surveillance. EXAM: CT ABDOMEN AND PELVIS WITH CONTRAST TECHNIQUE: Multidetector CT imaging of the abdomen and pelvis was performed using the standard protocol following bolus administration of intravenous contrast. CONTRAST:  126m OMNIPAQUE IOHEXOL 300 MG/ML  SOLN COMPARISON:  01/10/2015.  09/29/2012 FINDINGS: Lung bases: Minor subsegmental atelectasis. No mass or nodule. Lung bases otherwise clear. Heart normal in size. Liver: 4 mm low-density lesion in the medial segment left lobe, less defined on the delayed sequence, possibly a cyst or small meningioma. This is unchanged from multiple prior studies. Liver otherwise unremarkable. Gallbladder surgically absent. Mild prominence of the common bile duct stable duct is tortuous as it enters pancreatic head, also unchanged. Pancreas:  Unremarkable. Spleen: Normal. Adrenal glands:  No masses. Kidneys, ureters, bladder: Areas of bilateral cortical thinning. Two low-density right renal masses consistent with cysts, largest from the midpole  anteriorly measuring 4.3 cm. 3-4 mm nonobstructing stone in the upper pole of the left kidney. No hydronephrosis. Normal ureters. Bladder is unremarkable. Lymph nodes:  No adenopathy. Ascites:  None. Gastrointestinal: Moderate increased stool is noted residual colon in the pelvis.  There is no bowel wall thickening or inflammatory changes. There is no evidence of bowel obstruction. Bowel anastomosis staples across the stomach. Small bowel anastomosis staples are noted in the left mid abdomen. These findings are stable. There is no small bowel wall thickening or inflammatory changes. No mesenteric masses. Abdominal wall:  No hernia. Musculoskeletal:  No osteoblastic or osteolytic lesions. IMPRESSION: 1. No evidence of locally recurrent or metastatic colon carcinoma. 2. No acute findings. 3. Status post stomach colon surgery, stable appearance from the prior CTs. Moderate increased stool is noted in the residual colon in the pelvis. 4. Status post cholecystectomy, also stable. 5. Small low-density lesion in the medial segment of the left liver lobe, stable however a cyst or small hemangioma. 6. Status post cholecystectomy. 7. Right renal cysts nonobstructing stone in the upper pole left kidney. Electronically Signed   By: Lajean Manes M.D.   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  Labs today: CBC diff, CMET, CEA, anemia panel.  Return in 6 months with repeat labs: CBC diff, CMET, CEA  He is having repeated CT imaging for his SBOs and hospitalizations.  As a result, I will not preemptively order further imaging until after appointments to avoid excess radiation exposure.  He does need annual CT imaging per NCCN guidelines.   Accordingly, he will need 1 more surveillance CT imaging in 2017 to complete 5 years worth of imaging surveillance.  Last colonoscopy was in May 2014 by Dr. [name].  Will defer further colonoscopy surveillance to GI.  He was recently started on Keppra for 2 seizures.  He is being followed by a neurologist in [place], [place].  He notes a negative MRI of brain and EEG.  He has follow-up with his neurologist in Feb 2017.  Return in 6 months for follow-up.    THERAPY PLAN:  NCCN guidelines for surveillance for T3/T4, N0-2, M0 colorectal cancer are:   A. H+P every 3-6 months x 2 years and then every 6 months for a total of 5 years   B. CEA every 3 months x 2 years and then every 6 months for a total of 5 years   C. CT abd/pelvis annually for up to 5 years   D. Colonoscopy in 1 year except if no preoperative colonoscopy due to obstructing lesion, colonoscopy in 3-6 months.    1. If advanced adenoma, repeat in 1 year   2. If no advanced adenoma, repeat in 3 years, then every 5 years  E. PET scan not routinely recommended.   All questions were answered. The patient knows to call the clinic with any problems, questions or concerns. We can certainly see the patient much sooner if necessary.  Patient and plan discussed with Dr. Ancil Linsey and she is in agreement with the aforementioned.   This note is electronically signed by: Doy Mince 03/13/2015 1:51 PM

## 2015-03-12 NOTE — Assessment & Plan Note (Addendum)
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  Labs today: CBC diff, CMET, CEA, anemia panel.  Return in 6 months with repeat labs: CBC diff, CMET, CEA  He is having repeated CT imaging for his SBOs and hospitalizations.  As a result, I will not preemptively order further imaging until after appointments to avoid excess radiation exposure.  He does need annual CT imaging per NCCN guidelines.  Accordingly, he will need 1 more surveillance CT imaging in 2017 to complete 5 years worth of imaging surveillance.  Last colonoscopy was in May 2014 by Dr. [name].  Will defer further colonoscopy surveillance to GI.  He was recently started on Keppra for 2 seizures.  He is being followed by a neurologist in [place], [place].  He notes a negative MRI of brain and EEG.  He has follow-up with his neurologist in Feb 2017.  Return in 6 months for follow-up.

## 2015-03-13 ENCOUNTER — Encounter (HOSPITAL_COMMUNITY): Payer: Federal, State, Local not specified - PPO | Attending: Hematology and Oncology | Admitting: Oncology

## 2015-03-13 ENCOUNTER — Encounter (HOSPITAL_COMMUNITY): Payer: Self-pay | Admitting: Oncology

## 2015-03-13 VITALS — BP 108/74 | HR 94 | Temp 98.2°F | Resp 15 | Wt 148.6 lb

## 2015-03-13 DIAGNOSIS — R5383 Other fatigue: Secondary | ICD-10-CM | POA: Diagnosis not present

## 2015-03-13 DIAGNOSIS — D638 Anemia in other chronic diseases classified elsewhere: Secondary | ICD-10-CM | POA: Diagnosis not present

## 2015-03-13 DIAGNOSIS — C189 Malignant neoplasm of colon, unspecified: Secondary | ICD-10-CM | POA: Diagnosis present

## 2015-03-13 DIAGNOSIS — Z86711 Personal history of pulmonary embolism: Secondary | ICD-10-CM | POA: Diagnosis not present

## 2015-03-13 LAB — COMPREHENSIVE METABOLIC PANEL
ALK PHOS: 83 U/L (ref 38–126)
ALT: 13 U/L — AB (ref 17–63)
AST: 20 U/L (ref 15–41)
Albumin: 4 g/dL (ref 3.5–5.0)
Anion gap: 10 (ref 5–15)
BUN: 8 mg/dL (ref 6–20)
CALCIUM: 8.9 mg/dL (ref 8.9–10.3)
CO2: 27 mmol/L (ref 22–32)
CREATININE: 1.29 mg/dL — AB (ref 0.61–1.24)
Chloride: 102 mmol/L (ref 101–111)
GFR, EST NON AFRICAN AMERICAN: 57 mL/min — AB (ref >60)
Glucose, Bld: 88 mg/dL (ref 65–99)
Potassium: 4.5 mmol/L (ref 3.5–5.1)
SODIUM: 139 mmol/L (ref 135–145)
Total Bilirubin: 0.6 mg/dL (ref 0.3–1.2)
Total Protein: 6.9 g/dL (ref 6.5–8.1)

## 2015-03-13 LAB — CBC WITH DIFFERENTIAL/PLATELET
BASOS ABS: 0 10*3/uL (ref 0.0–0.1)
BASOS PCT: 0 %
EOS ABS: 0.2 10*3/uL (ref 0.0–0.7)
EOS PCT: 2 %
HCT: 47.5 % (ref 39.0–52.0)
HEMOGLOBIN: 15.8 g/dL (ref 13.0–17.0)
LYMPHS ABS: 2.3 10*3/uL (ref 0.7–4.0)
Lymphocytes Relative: 24 %
MCH: 30.5 pg (ref 26.0–34.0)
MCHC: 33.3 g/dL (ref 30.0–36.0)
MCV: 91.7 fL (ref 78.0–100.0)
Monocytes Absolute: 0.8 10*3/uL (ref 0.1–1.0)
Monocytes Relative: 9 %
NEUTROS PCT: 65 %
Neutro Abs: 6.3 10*3/uL (ref 1.7–7.7)
Platelets: 201 10*3/uL (ref 150–400)
RBC: 5.18 MIL/uL (ref 4.22–5.81)
RDW: 15 % (ref 11.5–15.5)
WBC: 9.6 10*3/uL (ref 4.0–10.5)

## 2015-03-13 LAB — FERRITIN: FERRITIN: 37 ng/mL (ref 24–336)

## 2015-03-13 LAB — IRON AND TIBC
IRON: 126 ug/dL (ref 45–182)
Saturation Ratios: 34 % (ref 17.9–39.5)
TIBC: 367 ug/dL (ref 250–450)
UIBC: 241 ug/dL

## 2015-03-13 LAB — TSH: TSH: 0.731 u[IU]/mL (ref 0.350–4.500)

## 2015-03-13 LAB — VITAMIN B12: Vitamin B-12: 383 pg/mL (ref 180–914)

## 2015-03-13 MED ORDER — HEPARIN SOD (PORK) LOCK FLUSH 100 UNIT/ML IV SOLN
500.0000 [IU] | Freq: Once | INTRAVENOUS | Status: DC
  Administered 2015-03-13: 500 [IU] via INTRAVENOUS

## 2015-03-13 MED ORDER — SODIUM CHLORIDE 0.9 % IJ SOLN
10.0000 mL | INTRAMUSCULAR | Status: DC | PRN
  Administered 2015-03-13: 10 mL via INTRAVENOUS

## 2015-03-13 MED ORDER — HEPARIN SOD (PORK) LOCK FLUSH 100 UNIT/ML IV SOLN: 500.0000 [IU] | Freq: Once | INTRAVENOUS | Status: AC

## 2015-03-13 NOTE — Patient Instructions (Signed)
[place] at [facility] Discharge Instructions  RECOMMENDATIONS MADE BY THE CONSULTANT AND ANY TEST RESULTS WILL BE SENT TO YOUR REFERRING PHYSICIAN.  Exam and discussion by Robynn Pane, PA-C Will check labs today and flush your port.  If any concerns with your labs we will call you. Call with concerns.  Port flushes every 6 - 8 weeks Labs and office visit in 6 months.  Thank you for choosing [place] at [facility] to provide your oncology and hematology care.  To afford each patient quality time with our provider, please arrive at least 15 minutes before your scheduled appointment time.    You need to re-schedule your appointment should you arrive 10 or more minutes late.  We strive to give you quality time with our providers, and arriving late affects you and other patients whose appointments are after yours.  Also, if you no show three or more times for appointments you may be dismissed from the clinic at the providers discretion.     Again, thank you for choosing [facility].  Our hope is that these requests will decrease the amount of time that you wait before being seen by our physicians.       _____________________________________________________________  Should you have questions after your visit to [facility], please contact our office at [phone] between the hours of 8:30 a.m. and 4:30 p.m.  Voicemails left after 4:30 p.m. will not be returned until the following business day.  For prescription refill requests, have your pharmacy contact our office.

## 2015-03-13 NOTE — Progress Notes (Signed)
[name] presented for Portacath access and flush. Proper placement of portacath confirmed by CXR. Portacath located left chest wall accessed with  H 20 needle. Good blood return present. Portacath flushed with 20ml NS and 500U/5ml Heparin and needle removed intact. Procedure without incident. Patient tolerated procedure well.   

## 2015-03-13 NOTE — Addendum Note (Signed)
Addended by: Mellissa Kohut on: 03/13/2015 06:26 PM   Modules accepted: Orders

## 2015-03-14 ENCOUNTER — Other Ambulatory Visit (HOSPITAL_COMMUNITY): Payer: Self-pay | Admitting: Oncology

## 2015-03-14 DIAGNOSIS — E559 Vitamin D deficiency, unspecified: Secondary | ICD-10-CM

## 2015-03-14 DIAGNOSIS — C189 Malignant neoplasm of colon, unspecified: Secondary | ICD-10-CM

## 2015-03-14 DIAGNOSIS — Z903 Acquired absence of stomach [part of]: Secondary | ICD-10-CM

## 2015-03-14 DIAGNOSIS — D509 Iron deficiency anemia, unspecified: Secondary | ICD-10-CM

## 2015-03-14 LAB — FOLATE

## 2015-03-14 LAB — VITAMIN D 25 HYDROXY (VIT D DEFICIENCY, FRACTURES): Vit D, 25-Hydroxy: 25.3 ng/mL — ABNORMAL LOW (ref 30.0–100.0)

## 2015-03-14 LAB — CEA: CEA: 4.9 ng/mL — AB (ref 0.0–4.7)

## 2015-03-14 MED ORDER — VITAMIN D (ERGOCALCIFEROL) 1.25 MG (50000 UNIT) PO CAPS: 50000.0000 [IU] | ORAL_CAPSULE | ORAL | Status: DC

## 2015-04-04 ENCOUNTER — Encounter (HOSPITAL_COMMUNITY): Payer: Medicare Other

## 2015-04-11 ENCOUNTER — Encounter: Payer: Self-pay | Admitting: Internal Medicine

## 2015-04-25 ENCOUNTER — Encounter: Payer: Self-pay | Admitting: Internal Medicine

## 2015-04-25 ENCOUNTER — Other Ambulatory Visit: Payer: Self-pay

## 2015-04-25 ENCOUNTER — Ambulatory Visit (INDEPENDENT_AMBULATORY_CARE_PROVIDER_SITE_OTHER): Payer: Medicare Other | Admitting: Internal Medicine

## 2015-04-25 VITALS — BP 120/81 | HR 88 | Temp 96.7°F | Ht 69.0 in | Wt 149.4 lb

## 2015-04-25 DIAGNOSIS — G8929 Other chronic pain: Secondary | ICD-10-CM

## 2015-04-25 DIAGNOSIS — C189 Malignant neoplasm of colon, unspecified: Secondary | ICD-10-CM

## 2015-04-25 DIAGNOSIS — K219 Gastro-esophageal reflux disease without esophagitis: Secondary | ICD-10-CM

## 2015-04-25 DIAGNOSIS — R1310 Dysphagia, unspecified: Secondary | ICD-10-CM

## 2015-04-25 DIAGNOSIS — R109 Unspecified abdominal pain: Secondary | ICD-10-CM

## 2015-04-25 DIAGNOSIS — K2270 Barrett's esophagus without dysplasia: Secondary | ICD-10-CM | POA: Diagnosis not present

## 2015-04-25 DIAGNOSIS — Z86010 Personal history of colonic polyps: Secondary | ICD-10-CM

## 2015-04-25 LAB — HEPATIC FUNCTION PANEL
ALK PHOS: 69 U/L (ref 40–115)
ALT: 13 U/L (ref 9–46)
AST: 21 U/L (ref 10–35)
Albumin: 3.8 g/dL (ref 3.6–5.1)
BILIRUBIN DIRECT: 0.1 mg/dL (ref <=0.2)
BILIRUBIN INDIRECT: 0.3 mg/dL (ref 0.2–1.2)
TOTAL PROTEIN: 6.2 g/dL (ref 6.1–8.1)
Total Bilirubin: 0.4 mg/dL (ref 0.2–1.2)

## 2015-04-25 MED ORDER — PEG 3350-KCL-NA BICARB-NACL 420 G PO SOLR: 4000.0000 mL | ORAL | Status: DC

## 2015-04-25 NOTE — Patient Instructions (Signed)
Schedule EGD with ED and surveillance colonoscopy (dysphagia, barretts and colon polyp/cancer history) no sooner than 230 pm -   Patient to remain on lovenox  Continue stool softener and benefiber  Hepatic function profile  Further recommendations to follow

## 2015-04-25 NOTE — Progress Notes (Signed)
Primary Care Physician:  [name], MD Primary Gastroenterologist:  Dr. [name]  Pre-Procedure History & Physical: HPI:  [name] is a 65 y.o. male here for follow-up of GERD and esophageal dysphagia. 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 Dysphagia improved.  Likely constipated on Colace and Benefiber. History of stage III colon cancer. Status post resection. Adenoma found at 2014; due for surveillance now. Also,  short segment Barrett's esophagus  -  due for surveillance at this time. Interesting to note that patient's LFTs were markedly elevated last fall and his hepatitis A IgM was positive. Patient states he is unaware of a diagnosis of acute hepatitis A. He has been fatigued recently. He received some IV iron in December  -iron parameters were within the lower end of normal. The patient has not had any any melena or rectal bleeding..  Past Medical History  Diagnosis Date  . Pulmonary embolism ([place]) 02/2010  . Hypertension   . Osteoporosis   . Arthritis   . TIA (transient ischemic attack) 10/11  . ETOH abuse     quit 03/2010  . 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 Personal history of PE (pulmonary embolism) 10/01/2010  . Blood transfusion   . S/P endoscopy September 28, 2010    erosive reflux esophagitis, Billroth I anatomy  . Shortness of breath   . Sleep apnea   . Recurrent upper respiratory infection (URI)   . Anxiety   . Pneumonia   . Anemia   . Ileus ([place])   . Chronic abdominal pain   . GERD (gastroesophageal reflux disease)   . Chest pain at rest   . Erosive esophagitis   . Vitamin B12 deficiency   . Bowel obstruction ([place]) 05/13/2012    Recurrent  . Hx of Clostridium difficile infection 01/2012  . Bronchitis   . 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 Obstruction of bowel ([place]) 03/03/14  . Seizures ([place])   . Barrett's esophagus     Past Surgical History  Procedure Laterality Date  . Hernia repair     right inguinal  . Appendectomy  1980s  . Cholecystectomy  1980s  . Portacath placement    . Abdominal sugery      for bowel obstruction x 8, all in 1980s, except for one in 07/2010  . Esophagogastroduodenoscopy  09/28/2010  . Esophagogastroduodenoscopy  12/01/2010    Cervical web status post dilation, erosive esophagitis, B1 hemigastrectomy, inflamed anastomosis  . Colonoscopy  03/18/2011    anastomosis at 35cm. Several adenomatous polyps removed. Sigmoid diverticulosis. Next TCS 02/2013  . Esophagogastroduodenoscopy  04/16/2011    excoriation at GEJ c/w trauma/M-W tear, friable gastric anastomosis, dilation efferent limb  . Billroth 1 hemigastrectomy  1980s    per patient for benign duodenal tumor  . Esophagogastroduodenoscopy (egd) with esophageal dilation  02/25/2012    DHR:CBULAGTX esophageal web-s/p dilation anddisruption as described above. Status post prior gastric with Billroth I configuration. Abnormal gastric mucosa at the anastomosis. Gastric biopsy showed mild chronic inflammation but no H. pylori   . Colonoscopy N/A 07/24/2012    MIW:OEHOZY post segmental resection with normal-appearing colonic anastomosis aside from an adjacent polyp-removed as described above. Rectal polyp-removed as described above. CT findings appear to have been artifactual. tubular adenomas/prolapsed type polyp.  . Colon surgery  May 2012    left hemicolectomy, colon cancer found at time of surgery for bowel obstruction  . Cardiac catheterization  07/17/2012  .  Abdominal adhesion surgery  03/04/15    @ [facility]  . Esophagogastroduodenoscopy N/A 06/03/2014    Dr.Zayde Stroupe- cervcal esopphageal web s/p dilation. abnormal distal esophagus bx= barretts esophagus  . Savory dilation N/A 06/03/2014    Procedure: SAVORY DILATION;  Surgeon: [name], MD;  [facility];  Service: Endoscopy;  Laterality: N/A;  [name] dilation N/A 06/03/2014    Procedure: [name] DILATION;  Surgeon: [name], MD;  Location:  [facility];  Service: Endoscopy;  Laterality: N/A;    Prior to Admission medications   Medication Sig Start Date End Date Taking? Authorizing Provider  alendronate (FOSAMAX) 70 MG tablet Take 70 mg by mouth every 7 (seven) days. On sundays 10/14/11  Yes Historical Provider, MD  atorvastatin (LIPITOR) 20 MG tablet Take 20 mg by mouth daily.   Yes Historical Provider, MD  cholecalciferol (VITAMIN D) 1000 UNITS tablet Take 1,000 Units by mouth every morning.   Yes Historical Provider, MD  cyanocobalamin (,VITAMIN B-12,) 1000 MCG/ML injection Inject 1,000 mcg into the muscle every 30 (thirty) days.   Yes Historical Provider, MD  cyclobenzaprine (FLEXERIL) 10 MG tablet Take 10 mg by mouth daily as needed for muscle spasms.  11/18/11  Yes Historical Provider, MD  dexlansoprazole (DEXILANT) 60 MG capsule Take 1 capsule (60 mg total) by mouth every morning. 06/11/14  Yes Orvil Feil, NP  docusate sodium (COLACE) 100 MG capsule Take 100 mg by mouth 2 (two) times daily.   Yes Historical Provider, MD  enoxaparin (LOVENOX) 60 MG/0.6ML injection Inject 60 mg into the skin every evening.    Yes Historical Provider, MD  ENSURE PLUS (ENSURE PLUS) LIQD Take 237 mLs by mouth daily. Every other day   Yes Historical Provider, MD  ferrous [id number] C-folic acid (FEROCON) capsule Take 1 capsule by mouth daily.   Yes Historical Provider, MD  ferrous sulfate 325 (65 FE) MG tablet Take 325 mg by mouth daily with breakfast.   Yes Historical Provider, MD  folic acid (FOLVITE) 1 MG tablet Take 1 mg by mouth every morning.    Yes Historical Provider, MD  levETIRAcetam (KEPPRA) 750 MG tablet Take 750 mg by mouth 2 (two) times daily.   Yes Historical Provider, MD  lidocaine-prilocaine (EMLA) cream Apply 1 application topically as needed. Patient taking differently: Apply 1 application topically as needed (for port access).  03/29/14  Yes Patrici Ranks, MD  LORazepam (ATIVAN) 1 MG tablet Take 1 mg by mouth every 4  (four) hours as needed. anxiety   Yes Historical Provider, MD  Melatonin 10 MG CAPS Take 10 mg by mouth at bedtime as needed (for sleep).    Yes Historical Provider, MD  metoCLOPramide (REGLAN) 10 MG tablet Take 1 tablet (10 mg total) by mouth every 6 (six) hours as needed for nausea. 12/03/14  Yes Kathie Dike, MD  Multiple Vitamins-Minerals (MULTIVITAMINS THER. W/MINERALS) TABS tablet Take 1 tablet by mouth daily.   Yes Historical Provider, MD  ondansetron (ZOFRAN) 4 MG tablet Take 1 tablet (4 mg total) by mouth every 8 (eight) hours as needed for nausea. 02/22/14  Yes Aviva Signs, MD  polyethylene glycol powder (GLYCOLAX/MIRALAX) powder Take 17 g by mouth as needed. For constipation   Yes Historical Provider, MD  Probiotic Product (PROBIOTIC DAILY) CAPS Take 1 capsule by mouth daily.   Yes Historical Provider, MD  prochlorperazine (COMPAZINE) 10 MG tablet Take 10 mg by mouth every 6 (six) hours as needed. Nausea and Vomiting  Yes Historical Provider, MD  psyllium (REGULOID) 0.52 G capsule Take 5 capsules by mouth 2 (two) times daily.    Yes Historical Provider, MD  sertraline (ZOLOFT) 100 MG tablet Take 150 mg by mouth at bedtime.    Yes Historical Provider, MD  sucralfate (CARAFATE) 1 G tablet Take 1 g by mouth 4 (four) times daily.   Yes Historical Provider, MD  thiamine 100 MG tablet Take 100 mg by mouth every morning.    Yes Historical Provider, MD  traMADol (ULTRAM) 50 MG tablet Take 50 mg by mouth every 6 (six) hours as needed for moderate pain or severe pain.    Yes Historical Provider, MD  valsartan-hydrochlorothiazide (DIOVAN-HCT) 80-12.5 MG per tablet Take 1 tablet by mouth every morning.    Yes Historical Provider, MD  Vitamin D, Ergocalciferol, (DRISDOL) 50000 UNITS CAPS capsule Take 1 capsule (50,000 Units total) by mouth once a week. Takes on Sunday 03/14/15  Yes Manon Hilding Kefalas, PA-C  Wheat Dextrin (BENEFIBER PO) Take 1 each by mouth daily as needed (to regulate bowel  movements/for supplement).    Yes Historical Provider, MD  albuterol (PROAIR HFA) 108 (90 BASE) MCG/ACT inhaler Inhale into the lungs every 6 (six) hours as needed for wheezing or shortness of breath.    Historical Provider, MD    Allergies as of 04/25/2015  . (No Known Allergies)    Family History  Problem Relation Age of Onset  . Hypertension Mother   . Arthritis Mother   . Pneumonia Mother   . Hypertension Father   . Heart attack Father     Social History   Social History  . Marital Status: Married    Spouse Name: N/A  . Number of Children: 3  . Years of Education: N/A   Occupational History  .  [employer]   Social History Main Topics  . Smoking status: Former Smoker -- 0.50 packs/day for 40 years    Types: Cigarettes    Quit date: 12/20/2012  . Smokeless tobacco: Never Used  . Alcohol Use: No  . Drug Use: No  . Sexual Activity: No   Other Topics Concern  . Not on file   Social History Narrative    Review of Systems: See HPI, otherwise negative ROS  Physical Exam: BP 120/81 mmHg  Pulse 88  Temp(Src) 96.7 F (35.9 C)  Ht '5\' 9"'$  (1.753 m)  Wt 149 lb 6.4 oz (67.767 kg)  BMI 22.05 kg/m2 General:   Alert,  Well-developed, well-nourished, pleasant and cooperative in NAD Skin:  Intact without significant lesions or rashes. Eyes:  Sclera clear, no icterus.   Conjunctiva pink. Ears:  Normal auditory acuity. Nose:  No deformity, discharge,  or lesions. Mouth:  No deformity or lesions. Neck:  Supple; no masses or thyromegaly. No significant cervical adenopathy. Lungs:  Clear throughout to auscultation.   No wheezes, crackles, or rhonchi. No acute distress. Heart:  Regular rate and rhythm; no murmurs, clicks, rubs,  or gallops. Abdomen: Non-distended, normal bowel sounds.  Soft and nontender without appreciable mass or hepatosplenomegaly.  Pulses:  Normal pulses noted. Extremities:  Without clubbing or edema.  Impression:  66 year old male with  long-standing reflux now with short segment Barrett's esophagus;  symptoms well- controlled on Dexilant 60 mg daily and Carafate 1 g a.c. And at bedtime. Constipation well managed with Colace and Benefiber. Patient does have recurrent esophageal dysphagia. He is due for one-year surveillance EGD for Barrett's anyway. I think we need to go  and plan to set him up and perform a dilation as appropriate. Given history of stage III colon cancer and adenoma removed 3 years ago, he will be due for a surveillance colonoscopy now as well. He continues on Lovenox. We'll plan on not manipulating his Lovenox regimen for the procedure.  It is interesting that he has had  documentation of acute hepatitis A infection last fall  -  patient is not aware of this diagnosis. That may explain some of this fatigue. His LFTs were coming down.   Recommendations:  Schedule EGD with ED and surveillance colonoscopy (dysphagia, barretts and colon polyp/cancer history) no sooner than 230 pm -   Patient to remain on lovenox which he takes in the evenings  Continue stool softener and benefiber  Hepatic function profile  Further recommendations to follow    Notice: This dictation was prepared with Dragon dictation along with smaller phrase technology. Any transcriptional errors that result from this process are unintentional and may not be corrected upon review.

## 2015-05-08 ENCOUNTER — Encounter (HOSPITAL_COMMUNITY): Payer: Medicare Other

## 2015-05-15 ENCOUNTER — Encounter (HOSPITAL_COMMUNITY): Payer: Self-pay | Admitting: *Deleted

## 2015-05-15 ENCOUNTER — Encounter (HOSPITAL_COMMUNITY)
Admission: RE | Disposition: A | Discharge status: Home / Self Care | Payer: Self-pay | Source: Ambulatory Visit | Attending: Internal Medicine

## 2015-05-15 ENCOUNTER — Ambulatory Visit (HOSPITAL_COMMUNITY)
Admission: RE | Admit: 2015-05-15 | Discharge: 2015-05-15 | Disposition: A | Discharge status: Home / Self Care | Payer: Medicare Other | Source: Ambulatory Visit | Attending: Internal Medicine | Admitting: Internal Medicine

## 2015-05-15 DIAGNOSIS — Z9049 Acquired absence of other specified parts of digestive tract: Secondary | ICD-10-CM | POA: Insufficient documentation

## 2015-05-15 DIAGNOSIS — I10 Essential (primary) hypertension: Secondary | ICD-10-CM | POA: Diagnosis not present

## 2015-05-15 DIAGNOSIS — Z7901 Long term (current) use of anticoagulants: Secondary | ICD-10-CM | POA: Diagnosis not present

## 2015-05-15 DIAGNOSIS — M1991 Primary osteoarthritis, unspecified site: Secondary | ICD-10-CM | POA: Insufficient documentation

## 2015-05-15 DIAGNOSIS — Z86010 Personal history of colonic polyps: Secondary | ICD-10-CM | POA: Diagnosis not present

## 2015-05-15 DIAGNOSIS — K2270 Barrett's esophagus without dysplasia: Secondary | ICD-10-CM | POA: Diagnosis not present

## 2015-05-15 DIAGNOSIS — R1310 Dysphagia, unspecified: Secondary | ICD-10-CM | POA: Diagnosis not present

## 2015-05-15 DIAGNOSIS — Z85038 Personal history of other malignant neoplasm of large intestine: Secondary | ICD-10-CM | POA: Diagnosis not present

## 2015-05-15 DIAGNOSIS — K449 Diaphragmatic hernia without obstruction or gangrene: Secondary | ICD-10-CM | POA: Diagnosis not present

## 2015-05-15 DIAGNOSIS — R1319 Other dysphagia: Secondary | ICD-10-CM | POA: Diagnosis present

## 2015-05-15 DIAGNOSIS — Z86711 Personal history of pulmonary embolism: Secondary | ICD-10-CM | POA: Diagnosis not present

## 2015-05-15 DIAGNOSIS — F419 Anxiety disorder, unspecified: Secondary | ICD-10-CM | POA: Insufficient documentation

## 2015-05-15 DIAGNOSIS — E538 Deficiency of other specified B group vitamins: Secondary | ICD-10-CM | POA: Insufficient documentation

## 2015-05-15 DIAGNOSIS — Z87891 Personal history of nicotine dependence: Secondary | ICD-10-CM | POA: Insufficient documentation

## 2015-05-15 HISTORY — PX: ESOPHAGOGASTRODUODENOSCOPY: SHX5428

## 2015-05-15 HISTORY — PX: COLONOSCOPY: SHX5424

## 2015-05-15 HISTORY — PX: MALONEY DILATION: SHX5535

## 2015-05-15 SURGERY — EGD (ESOPHAGOGASTRODUODENOSCOPY)
Anesthesia: Moderate Sedation

## 2015-05-15 MED ORDER — LIDOCAINE VISCOUS 2 % MT SOLN
OROMUCOSAL | Status: AC
  Filled 2015-05-15: qty 15

## 2015-05-15 MED ORDER — MIDAZOLAM HCL 5 MG/5ML IJ SOLN
INTRAMUSCULAR | Status: AC
  Filled 2015-05-15: qty 10

## 2015-05-15 MED ORDER — ONDANSETRON HCL 4 MG/2ML IJ SOLN
INTRAMUSCULAR | Status: DC | PRN
  Administered 2015-05-15: 4 mg via INTRAVENOUS

## 2015-05-15 MED ORDER — MEPERIDINE HCL 100 MG/ML IJ SOLN
INTRAMUSCULAR | Status: DC | PRN
  Administered 2015-05-15: 25 mg via INTRAVENOUS
  Administered 2015-05-15: 50 mg via INTRAVENOUS
  Administered 2015-05-15: 25 mg via INTRAVENOUS
  Administered 2015-05-15: 50 mg via INTRAVENOUS

## 2015-05-15 MED ORDER — ONDANSETRON HCL 4 MG/2ML IJ SOLN
INTRAMUSCULAR | Status: AC
  Filled 2015-05-15: qty 2

## 2015-05-15 MED ORDER — MEPERIDINE HCL 100 MG/ML IJ SOLN
INTRAMUSCULAR | Status: AC
  Filled 2015-05-15: qty 2

## 2015-05-15 MED ORDER — MIDAZOLAM HCL 5 MG/5ML IJ SOLN
INTRAMUSCULAR | Status: DC | PRN
Start: 2015-05-15 — End: 2015-05-15
  Administered 2015-05-15 (×2): 1 mg via INTRAVENOUS
  Administered 2015-05-15 (×3): 2 mg via INTRAVENOUS

## 2015-05-15 MED ORDER — SODIUM CHLORIDE 0.9 % IV SOLN
INTRAVENOUS | Status: DC
  Administered 2015-05-15: 14:00:00 via INTRAVENOUS

## 2015-05-15 MED ORDER — STERILE WATER FOR IRRIGATION IR SOLN
Status: DC | PRN
  Administered 2015-05-15: 14:00:00

## 2015-05-15 NOTE — Op Note (Signed)
[facility] [address] [place], [address]   ENDOSCOPY PROCEDURE REPORT  PATIENT: Versie, Fleener  MR#: [id number] BIRTHDATE: 1950-05-01 , 23  yrs. old GENDER: male ENDOSCOPIST: R.  Garfield Cornea, MD FACP [facility] REFERRED BY:  [name], MD PROCEDURE DATE:  05/20/2015 PROCEDURE:  EGD w/ biopsy and Maloney dilation of esophagus INDICATIONS:  Recurrent esophageal dysphagia; history of Barrett's esophagus. MEDICATIONS: Versed 7 mg IV and Demerol 125 mg IV in divided doses. Zofran 4 mg IV.  Xylocaine gel orally ASA CLASS:      Class III  CONSENT: The risks, benefits, limitations, alternatives and imponderables have been discussed.  The potential for biopsy, esophogeal dilation, etc. have also been reviewed.  Questions have been answered.  All parties agreeable.  Please see the history and physical in the medical record for more information.  DESCRIPTION OF PROCEDURE: After the risks benefits and alternatives of the procedure were thoroughly explained, informed consent was obtained.  The EG-[id number] ([id number]) endoscope was introduced through the mouth and advanced to the second portion of the duodenum , limited by Without limitations. The instrument was slowly withdrawn as the mucosa was fully examined. Estimated blood loss is zero unless otherwise noted in this procedure report.    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  Again, a 2x.2 cm tongue of salmon-colored epithelium coming up into the distal esophagus.  3 mm area of nodularity opposite wall (not in the Barrett's epithelium area) .  No esophagitis.  The remainder of the tubular esophagus distal to the upper esophagus sphincter was patent.  Stomach empty.  Status post prior hemigastrectomy with a single patent efferent and normal appearing small bowel limb.  2 cm hiatal hernia.  Normal-appearing residual gastric mucosa.  Scope was withdrawn and a 56 [place] Maloney dilator was passed to full  insertion easily.  Subsequently, a 56 [place] Maloney dilator was passed to full insertion with ease.  A low back reveal the web had been nicely dilated without apparent complication.  Finally, the distal esophageal nodule was biopsied separately from biopsy of the Barrett's epithelium separately.  Retroflexed views revealed a hiatal hernia.     The scope was then withdrawn from the patient and the procedure completed.  COMPLICATIONS: There were no immediate complications. EBL 3 mL ENDOSCOPIC IMPRESSION: Cervical web?"dilated as described above. Distal esophageal nodule biopsied. Barrett's esophagus biopsied. Hiatal hernia. Status post partial gastrectomy  RECOMMENDATIONS: Continue Dexilant 60 mg daily. Follow-up pathology. Continue Lovenox without interruption. See colonoscopy report.  REPEAT EXAM:  eSigned:  R. Garfield Cornea, MD [facility] 05-20-15 2:47 PM    CC:  CPT CODES: ICD CODES:  The ICD and CPT codes recommended by this software are interpretations from the data that the clinical staff has captured with the software.  The verification of the translation of this report to the ICD and CPT codes and modifiers is the sole responsibility of the health care institution and practicing physician where this report was generated.  [place]. will not be held responsible for the validity of the ICD and CPT codes included on this report.  AMA assumes no liability for data contained or not contained herein. CPT is a Designer, television/film set of the [employer].  PATIENT NAME:  Tavoris, Brisk MR#: [id number]

## 2015-05-15 NOTE — Op Note (Signed)
[facility] [address] [place], [address]   COLONOSCOPY PROCEDURE REPORT  PATIENT: [name], [name]  MR#: [id number] BIRTHDATE: 1951-02-24 , 64  yrs. old GENDER: male ENDOSCOPIST: R.  Garfield Cornea, MD FACP [facility] REFERRED BY: PROCEDURE DATE:  06-04-15 PROCEDURE:   Ileo-colonoscopy, surveillance INDICATIONS:History stage III colon cancer; status post subtotal colectomy. MEDICATIONS: Versed 8 mg IV and Demerol 50 mg IV in divided doses. Zofran 4 mg IV. ASA CLASS:       Class III  CONSENT: The risks, benefits, alternatives and imponderables including but not limited to bleeding, perforation as well as the possibility of a missed lesion have been reviewed.  The potential for biopsy, lesion removal, etc. have also been discussed. Questions have been answered.  All parties agreeable.  Please see the history and physical in the medical record for more information.  DESCRIPTION OF PROCEDURE:   After the risks benefits and alternatives of the procedure were thoroughly explained, informed consent was obtained.  The digital rectal exam revealed no abnormalities of the rectum.   The [id number] ([id number])  endoscope was introduced through the anus and advanced to the terminal ileum which was intubated for a short distance. No adverse events experienced.   The quality of the prep was adequate  The instrument was then slowly withdrawn as the colon was fully examined. Estimated blood loss is zero unless otherwise noted in this procedure report.      COLON FINDINGS: Normal perirectal mucosa.  Surgical anastomosis identified at 25 cm.  Very little residual colonic mucosa remains. Proximal to the anastomosis, the ileocecal valve/cecum was readily identified.  I intubated the terminal ileum approximately 10 cm. The residual colonic mucosa appeared normal.  The terminal ileal mucosa also appeared normal.  Retroflexion was performed. .  Withdrawal time=3 minutes 0  seconds.  The scope was withdrawn and the procedure completed. COMPLICATIONS: There were no immediate complications.  ENDOSCOPIC IMPRESSION: Status post subtotal colectomy. Normal residual rectum and colon as well as terminal ileum  RECOMMENDATIONS: Repeat surveillance examination in 5 years. See EGD report.  eSigned:  R. Garfield Cornea, MD [facility] 06/04/15 3:09 PM   cc:  CPT CODES: ICD CODES:  The ICD and CPT codes recommended by this software are interpretations from the data that the clinical staff has captured with the software.  The verification of the translation of this report to the ICD and CPT codes and modifiers is the sole responsibility of the health care institution and practicing physician where this report was generated.  [place]. will not be held responsible for the validity of the ICD and CPT codes included on this report.  AMA assumes no liability for data contained or not contained herein. CPT is a Designer, television/film set of the [employer].  PATIENT NAME:  [name], [name] MR#: [id number]

## 2015-05-15 NOTE — Discharge Instructions (Signed)
Colonoscopy Discharge Instructions  Read the instructions outlined below and refer to this sheet in the next few weeks. These discharge instructions provide you with general information on caring for yourself after you leave the hospital. Your doctor may also give you specific instructions. While your treatment has been planned according to the most current medical practices available, unavoidable complications occasionally occur. If you have any problems or questions after discharge, call Dr. [name] at [phone]. ACTIVITY  You may resume your regular activity, but move at a slower pace for the next 24 hours.   Take frequent rest periods for the next 24 hours.   Walking will help get rid of the air and reduce the bloated feeling in your belly (abdomen).   No driving for 24 hours (because of the medicine (anesthesia) used during the test).    Do not sign any important legal documents or operate any machinery for 24 hours (because of the anesthesia used during the test).  NUTRITION  Drink plenty of fluids.   You may resume your normal diet as instructed by your doctor.   Begin with a light meal and progress to your normal diet. Heavy or fried foods are harder to digest and may make you feel sick to your stomach (nauseated).   Avoid alcoholic beverages for 24 hours or as instructed.  MEDICATIONS  You may resume your normal medications unless your doctor tells you otherwise.  WHAT YOU CAN EXPECT TODAY  Some feelings of bloating in the abdomen.   Passage of more gas than usual.   Spotting of blood in your stool or on the toilet paper.  IF YOU HAD POLYPS REMOVED DURING THE COLONOSCOPY:  No aspirin products for 7 days or as instructed.   No alcohol for 7 days or as instructed.   Eat a soft diet for the next 24 hours.  FINDING OUT THE RESULTS OF YOUR TEST Not all test results are available during your visit. If your test results are not back during the visit, make an appointment  with your caregiver to find out the results. Do not assume everything is normal if you have not heard from your caregiver or the medical facility. It is important for you to follow up on all of your test results.  SEEK IMMEDIATE MEDICAL ATTENTION IF:  You have more than a spotting of blood in your stool.   Your belly is swollen (abdominal distention).   You are nauseated or vomiting.   You have a temperature over 101.   You have abdominal pain or discomfort that is severe or gets worse throughout the day.   Repeat colonoscopy in 5 years  Continue Dexilant 60 mg daily  Continue lovenox uninterrupted  Further recommendations to follow pending review of pathology report Gastrointestinal Endoscopy, Care After Refer to this sheet in the next few weeks. These instructions provide you with information on caring for yourself after your procedure. Your caregiver may also give you more specific instructions. Your treatment has been planned according to current medical practices, but problems sometimes occur. Call your caregiver if you have any problems or questions after your procedure. HOME CARE INSTRUCTIONS  If you were given medicine to help you relax (sedative), do not drive, operate machinery, or sign important documents for 24 hours.  Avoid alcohol and hot or warm beverages for the first 24 hours after the procedure.  Only take over-the-counter or prescription medicines for pain, discomfort, or fever as directed by your caregiver. You may resume taking your  normal medicines unless your caregiver tells you otherwise. Ask your caregiver when you may resume taking medicines that may cause bleeding, such as aspirin, clopidogrel, or warfarin.  You may return to your normal diet and activities on the day after your procedure, or as directed by your caregiver. Walking may help to reduce any bloated feeling in your abdomen.  Drink enough fluids to keep your urine clear or pale yellow.  You may  gargle with salt water if you have a sore throat. SEEK IMMEDIATE MEDICAL CARE IF:  You have severe nausea or vomiting.  You have severe abdominal pain, abdominal cramps that last longer than 6 hours, or abdominal swelling (distention).  You have severe shoulder or back pain.  You have trouble swallowing.  You have shortness of breath, your breathing is shallow, or you are breathing faster than normal.  You have a fever or a rapid heartbeat.  You vomit blood or material that looks like coffee grounds.  You have bloody, black, or tarry stools. MAKE SURE YOU:  Understand these instructions.  Will watch your condition.  Will get help right away if you are not doing well or get worse.   This information is not intended to replace advice given to you by your health care provider. Make sure you discuss any questions you have with your health care provider.   Document Released: 10/21/2003 Document Revised: 03/29/2014 Document Reviewed: 06/08/2011 Elsevier Interactive Patient Education [employer].

## 2015-05-15 NOTE — H&P (View-Only) (Signed)
Primary Care Physician:  [name], MD Primary Gastroenterologist:  Dr. [name]  Pre-Procedure History & Physical: HPI:  [name] is a 65 y.o. male here for follow-up of GERD and esophageal dysphagia. 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 Dysphagia improved.  Likely constipated on Colace and Benefiber. History of stage III colon cancer. Status post resection. Adenoma found at 2014; due for surveillance now. Also,  short segment Barrett's esophagus  -  due for surveillance at this time. Interesting to note that patient's LFTs were markedly elevated last fall and his hepatitis A IgM was positive. Patient states he is unaware of a diagnosis of acute hepatitis A. He has been fatigued recently. He received some IV iron in December  -iron parameters were within the lower end of normal. The patient has not had any any melena or rectal bleeding..  Past Medical History  Diagnosis Date  . Pulmonary embolism ([place]) 02/2010  . Hypertension   . Osteoporosis   . Arthritis   . TIA (transient ischemic attack) 10/11  . ETOH abuse     quit 03/2010  . 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 Personal history of PE (pulmonary embolism) 10/01/2010  . Blood transfusion   . S/P endoscopy September 28, 2010    erosive reflux esophagitis, Billroth I anatomy  . Shortness of breath   . Sleep apnea   . Recurrent upper respiratory infection (URI)   . Anxiety   . Pneumonia   . Anemia   . Ileus ([place])   . Chronic abdominal pain   . GERD (gastroesophageal reflux disease)   . Chest pain at rest   . Erosive esophagitis   . Vitamin B12 deficiency   . Bowel obstruction ([place]) 05/13/2012    Recurrent  . Hx of Clostridium difficile infection 01/2012  . Bronchitis   . 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 Obstruction of bowel ([place]) 03/03/14  . Seizures ([place])   . Barrett's esophagus     Past Surgical History  Procedure Laterality Date  . Hernia repair     right inguinal  . Appendectomy  1980s  . Cholecystectomy  1980s  . Portacath placement    . Abdominal sugery      for bowel obstruction x 8, all in 1980s, except for one in 07/2010  . Esophagogastroduodenoscopy  09/28/2010  . Esophagogastroduodenoscopy  12/01/2010    Cervical web status post dilation, erosive esophagitis, B1 hemigastrectomy, inflamed anastomosis  . Colonoscopy  03/18/2011    anastomosis at 35cm. Several adenomatous polyps removed. Sigmoid diverticulosis. Next TCS 02/2013  . Esophagogastroduodenoscopy  04/16/2011    excoriation at GEJ c/w trauma/M-W tear, friable gastric anastomosis, dilation efferent limb  . Billroth 1 hemigastrectomy  1980s    per patient for benign duodenal tumor  . Esophagogastroduodenoscopy (egd) with esophageal dilation  02/25/2012    BTD:HRCBULAG esophageal web-s/p dilation anddisruption as described above. Status post prior gastric with Billroth I configuration. Abnormal gastric mucosa at the anastomosis. Gastric biopsy showed mild chronic inflammation but no H. pylori   . Colonoscopy N/A 07/24/2012    TXM:IWOEHO post segmental resection with normal-appearing colonic anastomosis aside from an adjacent polyp-removed as described above. Rectal polyp-removed as described above. CT findings appear to have been artifactual. tubular adenomas/prolapsed type polyp.  . Colon surgery  May 2012    left hemicolectomy, colon cancer found at time of surgery for bowel obstruction  . Cardiac catheterization  07/17/2012  .  Abdominal adhesion surgery  03/04/15    @ [facility]  . Esophagogastroduodenoscopy N/A 06/03/2014    Dr.Kloee Ballew- cervcal esopphageal web s/p dilation. abnormal distal esophagus bx= barretts esophagus  . Savory dilation N/A 06/03/2014    Procedure: SAVORY DILATION;  Surgeon: [name], MD;  [facility];  Service: Endoscopy;  Laterality: N/A;  [name] dilation N/A 06/03/2014    Procedure: [name] DILATION;  Surgeon: [name], MD;  [facility];  Service: Endoscopy;  Laterality: N/A;    Prior to Admission medications   Medication Sig Start Date End Date Taking? Authorizing Provider  alendronate (FOSAMAX) 70 MG tablet Take 70 mg by mouth every 7 (seven) days. On sundays 10/14/11  Yes Historical Provider, MD  atorvastatin (LIPITOR) 20 MG tablet Take 20 mg by mouth daily.   Yes Historical Provider, MD  cholecalciferol (VITAMIN D) 1000 UNITS tablet Take 1,000 Units by mouth every morning.   Yes Historical Provider, MD  cyanocobalamin (,VITAMIN B-12,) 1000 MCG/ML injection Inject 1,000 mcg into the muscle every 30 (thirty) days.   Yes Historical Provider, MD  cyclobenzaprine (FLEXERIL) 10 MG tablet Take 10 mg by mouth daily as needed for muscle spasms.  11/18/11  Yes Historical Provider, MD  dexlansoprazole (DEXILANT) 60 MG capsule Take 1 capsule (60 mg total) by mouth every morning. 06/11/14  Yes Orvil Feil, NP  docusate sodium (COLACE) 100 MG capsule Take 100 mg by mouth 2 (two) times daily.   Yes Historical Provider, MD  enoxaparin (LOVENOX) 60 MG/0.6ML injection Inject 60 mg into the skin every evening.    Yes Historical Provider, MD  ENSURE PLUS (ENSURE PLUS) LIQD Take 237 mLs by mouth daily. Every other day   Yes Historical Provider, MD  ferrous [id number] C-folic acid (FEROCON) capsule Take 1 capsule by mouth daily.   Yes Historical Provider, MD  ferrous sulfate 325 (65 FE) MG tablet Take 325 mg by mouth daily with breakfast.   Yes Historical Provider, MD  folic acid (FOLVITE) 1 MG tablet Take 1 mg by mouth every morning.    Yes Historical Provider, MD  levETIRAcetam (KEPPRA) 750 MG tablet Take 750 mg by mouth 2 (two) times daily.   Yes Historical Provider, MD  lidocaine-prilocaine (EMLA) cream Apply 1 application topically as needed. Patient taking differently: Apply 1 application topically as needed (for port access).  03/29/14  Yes Patrici Ranks, MD  LORazepam (ATIVAN) 1 MG tablet Take 1 mg by mouth every 4  (four) hours as needed. anxiety   Yes Historical Provider, MD  Melatonin 10 MG CAPS Take 10 mg by mouth at bedtime as needed (for sleep).    Yes Historical Provider, MD  metoCLOPramide (REGLAN) 10 MG tablet Take 1 tablet (10 mg total) by mouth every 6 (six) hours as needed for nausea. 12/03/14  Yes Kathie Dike, MD  Multiple Vitamins-Minerals (MULTIVITAMINS THER. W/MINERALS) TABS tablet Take 1 tablet by mouth daily.   Yes Historical Provider, MD  ondansetron (ZOFRAN) 4 MG tablet Take 1 tablet (4 mg total) by mouth every 8 (eight) hours as needed for nausea. 02/22/14  Yes Aviva Signs, MD  polyethylene glycol powder (GLYCOLAX/MIRALAX) powder Take 17 g by mouth as needed. For constipation   Yes Historical Provider, MD  Probiotic Product (PROBIOTIC DAILY) CAPS Take 1 capsule by mouth daily.   Yes Historical Provider, MD  prochlorperazine (COMPAZINE) 10 MG tablet Take 10 mg by mouth every 6 (six) hours as needed. Nausea and Vomiting  Yes Historical Provider, MD  psyllium (REGULOID) 0.52 G capsule Take 5 capsules by mouth 2 (two) times daily.    Yes Historical Provider, MD  sertraline (ZOLOFT) 100 MG tablet Take 150 mg by mouth at bedtime.    Yes Historical Provider, MD  sucralfate (CARAFATE) 1 G tablet Take 1 g by mouth 4 (four) times daily.   Yes Historical Provider, MD  thiamine 100 MG tablet Take 100 mg by mouth every morning.    Yes Historical Provider, MD  traMADol (ULTRAM) 50 MG tablet Take 50 mg by mouth every 6 (six) hours as needed for moderate pain or severe pain.    Yes Historical Provider, MD  valsartan-hydrochlorothiazide (DIOVAN-HCT) 80-12.5 MG per tablet Take 1 tablet by mouth every morning.    Yes Historical Provider, MD  Vitamin D, Ergocalciferol, (DRISDOL) 50000 UNITS CAPS capsule Take 1 capsule (50,000 Units total) by mouth once a week. Takes on Sunday 03/14/15  Yes Manon Hilding Kefalas, PA-C  Wheat Dextrin (BENEFIBER PO) Take 1 each by mouth daily as needed (to regulate bowel  movements/for supplement).    Yes Historical Provider, MD  albuterol (PROAIR HFA) 108 (90 BASE) MCG/ACT inhaler Inhale into the lungs every 6 (six) hours as needed for wheezing or shortness of breath.    Historical Provider, MD    Allergies as of 04/25/2015  . (No Known Allergies)    Family History  Problem Relation Age of Onset  . Hypertension Mother   . Arthritis Mother   . Pneumonia Mother   . Hypertension Father   . Heart attack Father     Social History   Social History  . Marital Status: Married    Spouse Name: N/A  . Number of Children: 3  . Years of Education: N/A   Occupational History  .  [employer]   Social History Main Topics  . Smoking status: Former Smoker -- 0.50 packs/day for 40 years    Types: Cigarettes    Quit date: 12/20/2012  . Smokeless tobacco: Never Used  . Alcohol Use: No  . Drug Use: No  . Sexual Activity: No   Other Topics Concern  . Not on file   Social History Narrative    Review of Systems: See HPI, otherwise negative ROS  Physical Exam: BP 120/81 mmHg  Pulse 88  Temp(Src) 96.7 F (35.9 C)  Ht '5\' 9"'$  (1.753 m)  Wt 149 lb 6.4 oz (67.767 kg)  BMI 22.05 kg/m2 General:   Alert,  Well-developed, well-nourished, pleasant and cooperative in NAD Skin:  Intact without significant lesions or rashes. Eyes:  Sclera clear, no icterus.   Conjunctiva pink. Ears:  Normal auditory acuity. Nose:  No deformity, discharge,  or lesions. Mouth:  No deformity or lesions. Neck:  Supple; no masses or thyromegaly. No significant cervical adenopathy. Lungs:  Clear throughout to auscultation.   No wheezes, crackles, or rhonchi. No acute distress. Heart:  Regular rate and rhythm; no murmurs, clicks, rubs,  or gallops. Abdomen: Non-distended, normal bowel sounds.  Soft and nontender without appreciable mass or hepatosplenomegaly.  Pulses:  Normal pulses noted. Extremities:  Without clubbing or edema.  Impression:  65 year old male with  long-standing reflux now with short segment Barrett's esophagus;  symptoms well- controlled on Dexilant 60 mg daily and Carafate 1 g a.c. And at bedtime. Constipation well managed with Colace and Benefiber. Patient does have recurrent esophageal dysphagia. He is due for one-year surveillance EGD for Barrett's anyway. I think we need to go  and plan to set him up and perform a dilation as appropriate. Given history of stage III colon cancer and adenoma removed 3 years ago, he will be due for a surveillance colonoscopy now as well. He continues on Lovenox. We'll plan on not manipulating his Lovenox regimen for the procedure.  It is interesting that he has had  documentation of acute hepatitis A infection last fall  -  patient is not aware of this diagnosis. That may explain some of this fatigue. His LFTs were coming down.   Recommendations:  Schedule EGD with ED and surveillance colonoscopy (dysphagia, barretts and colon polyp/cancer history) no sooner than 230 pm -   Patient to remain on lovenox which he takes in the evenings  Continue stool softener and benefiber  Hepatic function profile  Further recommendations to follow    Notice: This dictation was prepared with Dragon dictation along with smaller phrase technology. Any transcriptional errors that result from this process are unintentional and may not be corrected upon review.

## 2015-05-15 NOTE — Interval H&P Note (Signed)
History and Physical Interval Note:  05/15/2015 2:10 PM  [name]  has presented today for surgery, with the diagnosis of DYSPHAGIA/BARRETT'S/COLON POLYPS  The various methods of treatment have been discussed with the patient and family. After consideration of risks, benefits and other options for treatment, the patient has consented to  Procedure(s) with comments: ESOPHAGOGASTRODUODENOSCOPY (EGD) (N/A) - 230 COLONOSCOPY (N/A) MALONEY DILATION (N/A) as a surgical intervention .  The patient's history has been reviewed, patient examined, no change in status, stable for surgery.  I have reviewed the patient's chart and labs.  Questions were answered to the patient's satisfaction.     [name]  No change. LFTs normal. EGD/EGD and colonoscopy per plan.   Patient continues on Lovenox;  last dose last evening per plan. The risks, benefits, limitations, imponderables and alternatives regarding both EGD and colonoscopy have been reviewed with the patient. Questions have been answered. All parties agreeable.

## 2015-05-20 ENCOUNTER — Encounter (HOSPITAL_COMMUNITY): Payer: Self-pay | Admitting: Internal Medicine

## 2015-05-20 ENCOUNTER — Encounter: Payer: Self-pay | Admitting: Internal Medicine

## 2015-05-21 ENCOUNTER — Telehealth: Payer: Self-pay

## 2015-05-21 NOTE — Telephone Encounter (Signed)
Per RMR- Send letter to patient.  Send copy of letter with path to referring provider and PCP.   Office visit 1 year

## 2015-05-21 NOTE — Telephone Encounter (Signed)
Letter mailed to the pt. 

## 2015-06-06 NOTE — Telephone Encounter (Signed)
Pt on recall list 

## 2015-06-30 ENCOUNTER — Ambulatory Visit (HOSPITAL_COMMUNITY): Payer: Medicare Other

## 2015-06-30 ENCOUNTER — Encounter (HOSPITAL_COMMUNITY): Payer: Self-pay

## 2015-07-03 ENCOUNTER — Encounter (HOSPITAL_COMMUNITY): Payer: Medicare Other

## 2015-07-07 ENCOUNTER — Emergency Department (HOSPITAL_COMMUNITY): Payer: Medicare Other

## 2015-07-07 ENCOUNTER — Emergency Department (HOSPITAL_COMMUNITY)
Admission: EM | Admit: 2015-07-07 | Discharge: 2015-07-07 | Disposition: A | Discharge status: Home / Self Care | Payer: Medicare Other | Attending: Emergency Medicine | Admitting: Emergency Medicine

## 2015-07-07 ENCOUNTER — Encounter (HOSPITAL_COMMUNITY): Payer: Self-pay | Admitting: *Deleted

## 2015-07-07 ENCOUNTER — Encounter (HOSPITAL_COMMUNITY): Payer: Medicare Other | Attending: Hematology & Oncology

## 2015-07-07 VITALS — BP 132/56 | HR 88 | Temp 98.2°F | Resp 16

## 2015-07-07 DIAGNOSIS — Z9049 Acquired absence of other specified parts of digestive tract: Secondary | ICD-10-CM | POA: Diagnosis not present

## 2015-07-07 DIAGNOSIS — Z87891 Personal history of nicotine dependence: Secondary | ICD-10-CM | POA: Insufficient documentation

## 2015-07-07 DIAGNOSIS — K5900 Constipation, unspecified: Secondary | ICD-10-CM | POA: Insufficient documentation

## 2015-07-07 DIAGNOSIS — Z452 Encounter for adjustment and management of vascular access device: Secondary | ICD-10-CM | POA: Diagnosis not present

## 2015-07-07 DIAGNOSIS — Z79899 Other long term (current) drug therapy: Secondary | ICD-10-CM | POA: Insufficient documentation

## 2015-07-07 DIAGNOSIS — M810 Age-related osteoporosis without current pathological fracture: Secondary | ICD-10-CM | POA: Insufficient documentation

## 2015-07-07 DIAGNOSIS — Z903 Acquired absence of stomach [part of]: Secondary | ICD-10-CM | POA: Insufficient documentation

## 2015-07-07 DIAGNOSIS — R112 Nausea with vomiting, unspecified: Secondary | ICD-10-CM | POA: Insufficient documentation

## 2015-07-07 DIAGNOSIS — I10 Essential (primary) hypertension: Secondary | ICD-10-CM | POA: Insufficient documentation

## 2015-07-07 DIAGNOSIS — R109 Unspecified abdominal pain: Secondary | ICD-10-CM | POA: Diagnosis present

## 2015-07-07 DIAGNOSIS — C189 Malignant neoplasm of colon, unspecified: Secondary | ICD-10-CM | POA: Diagnosis present

## 2015-07-07 DIAGNOSIS — R1012 Left upper quadrant pain: Secondary | ICD-10-CM | POA: Diagnosis not present

## 2015-07-07 LAB — COMPREHENSIVE METABOLIC PANEL
ALBUMIN: 3.8 g/dL (ref 3.5–5.0)
ALT: 18 U/L (ref 17–63)
AST: 22 U/L (ref 15–41)
Alkaline Phosphatase: 72 U/L (ref 38–126)
Anion gap: 9 (ref 5–15)
BUN: 7 mg/dL (ref 6–20)
CHLORIDE: 111 mmol/L (ref 101–111)
CO2: 23 mmol/L (ref 22–32)
Calcium: 9 mg/dL (ref 8.9–10.3)
Creatinine, Ser: 1.13 mg/dL (ref 0.61–1.24)
GFR calc Af Amer: >60 mL/min (ref >60)
GFR calc non Af Amer: >60 mL/min (ref >60)
GLUCOSE: 125 mg/dL — AB (ref 65–99)
POTASSIUM: 4.4 mmol/L (ref 3.5–5.1)
Sodium: 143 mmol/L (ref 135–145)
Total Bilirubin: 0.6 mg/dL (ref 0.3–1.2)
Total Protein: 6.9 g/dL (ref 6.5–8.1)

## 2015-07-07 LAB — CBC
HEMATOCRIT: 44.5 % (ref 39.0–52.0)
Hemoglobin: 14.6 g/dL (ref 13.0–17.0)
MCH: 29.9 pg (ref 26.0–34.0)
MCHC: 32.8 g/dL (ref 30.0–36.0)
MCV: 91 fL (ref 78.0–100.0)
Platelets: 226 10*3/uL (ref 150–400)
RBC: 4.89 MIL/uL (ref 4.22–5.81)
RDW: 14.2 % (ref 11.5–15.5)
WBC: 8.9 10*3/uL (ref 4.0–10.5)

## 2015-07-07 LAB — LIPASE, BLOOD: LIPASE: 27 U/L (ref 11–51)

## 2015-07-07 MED ORDER — FENTANYL CITRATE (PF) 100 MCG/2ML IJ SOLN
50.0000 ug | Freq: Once | INTRAMUSCULAR | Status: AC
  Administered 2015-07-07: 50 ug via INTRAVENOUS
  Filled 2015-07-07: qty 2

## 2015-07-07 MED ORDER — HEPARIN SOD (PORK) LOCK FLUSH 100 UNIT/ML IV SOLN
INTRAVENOUS | Status: AC
Start: 2015-07-07 — End: 2015-07-07
  Administered 2015-07-07: 500 [IU]
  Filled 2015-07-07: qty 5

## 2015-07-07 MED ORDER — ONDANSETRON HCL 4 MG/2ML IJ SOLN
4.0000 mg | Freq: Once | INTRAMUSCULAR | Status: AC
  Administered 2015-07-07: 4 mg via INTRAVENOUS
  Filled 2015-07-07: qty 2

## 2015-07-07 MED ORDER — HEPARIN SOD (PORK) LOCK FLUSH 100 UNIT/ML IV SOLN
500.0000 [IU] | Freq: Once | INTRAVENOUS | Status: AC
  Administered 2015-07-07: 500 [IU] via INTRAVENOUS
  Filled 2015-07-07: qty 5

## 2015-07-07 MED ORDER — SODIUM CHLORIDE 0.9% FLUSH
10.0000 mL | INTRAVENOUS | Status: DC | PRN
  Administered 2015-07-07: 10 mL via INTRAVENOUS
  Filled 2015-07-07: qty 10

## 2015-07-07 MED ORDER — SODIUM CHLORIDE 0.9 % IV BOLUS (SEPSIS)
500.0000 mL | Freq: Once | INTRAVENOUS | Status: AC
  Administered 2015-07-07: 500 mL via INTRAVENOUS

## 2015-07-07 MED ORDER — PROMETHAZINE HCL 25 MG PO TABS: 25.0000 mg | ORAL_TABLET | Freq: Four times a day (QID) | ORAL | Status: DC | PRN

## 2015-07-07 NOTE — ED Provider Notes (Signed)
CSN: [id number]     Arrival date & time 07/07/15  1522 History   First MD Initiated Contact with Patient 07/07/15 1745     Chief Complaint  Patient presents with  . Abdominal Pain     Patient is a 65 y.o. male presenting with abdominal pain. The history is provided by the patient.  Abdominal Pain Associated symptoms: constipation, nausea and vomiting   Associated symptoms: no chest pain, no dysuria and no shortness of breath    patient presented with abdominal pain. Began around 7 hours prior to arrival. It is sharp and on the left side. Patient states that he's had nausea and vomiting. States it feels like previous bowel obstructions. States she's not having bowel movements either. Still passing some gas. No fevers or chills. Previous history of colon cancer multiple abdominal surgeries and recurrent small bowel obstructions. Past Medical History  Diagnosis Date  . Pulmonary embolism ([place]) 02/2010  . Hypertension   . Osteoporosis   . Arthritis   . TIA (transient ischemic attack) 10/11  . ETOH abuse     quit 03/2010  . 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 Personal history of PE (pulmonary embolism) 10/01/2010  . Blood transfusion   . S/P endoscopy September 28, 2010    erosive reflux esophagitis, Billroth I anatomy  . Shortness of breath   . Sleep apnea   . Recurrent upper respiratory infection (URI)   . Anxiety   . Pneumonia   . Anemia   . Ileus ([place])   . Chronic abdominal pain   . GERD (gastroesophageal reflux disease)   . Chest pain at rest   . Erosive esophagitis   . Vitamin B12 deficiency   . Bowel obstruction ([place]) 05/13/2012    Recurrent  . Hx of Clostridium difficile infection 01/2012  . Bronchitis   . 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 Obstruction of bowel ([place]) 03/03/14  . Seizures ([place])   . Barrett's esophagus    Past Surgical History  Procedure Laterality Date  . Hernia repair      right inguinal  . Appendectomy  1980s  .  Cholecystectomy  1980s  . Portacath placement    . Abdominal sugery      for bowel obstruction x 8, all in 1980s, except for one in 07/2010  . Esophagogastroduodenoscopy  09/28/2010  . Esophagogastroduodenoscopy  12/01/2010    Cervical web status post dilation, erosive esophagitis, B1 hemigastrectomy, inflamed anastomosis  . Colonoscopy  03/18/2011    anastomosis at 35cm. Several adenomatous polyps removed. Sigmoid diverticulosis. Next TCS 02/2013  . Esophagogastroduodenoscopy  04/16/2011    excoriation at GEJ c/w trauma/M-W tear, friable gastric anastomosis, dilation efferent limb  . Billroth 1 hemigastrectomy  1980s    per patient for benign duodenal tumor  . Esophagogastroduodenoscopy (egd) with esophageal dilation  02/25/2012    ZWC:HENIDPOE esophageal web-s/p dilation anddisruption as described above. Status post prior gastric with Billroth I configuration. Abnormal gastric mucosa at the anastomosis. Gastric biopsy showed mild chronic inflammation but no H. pylori   . Colonoscopy N/A 07/24/2012    UMP:NTIRWE post segmental resection with normal-appearing colonic anastomosis aside from an adjacent polyp-removed as described above. Rectal polyp-removed as described above. CT findings appear to have been artifactual. tubular adenomas/prolapsed type polyp.  . Colon surgery  May 2012    left hemicolectomy, colon cancer found at time of surgery for bowel obstruction  . Cardiac catheterization  07/17/2012  . Abdominal  adhesion surgery  03/04/15    @ [facility]  . Esophagogastroduodenoscopy N/A 06/03/2014    Dr.[name]- cervcal esopphageal web s/p dilation. abnormal distal esophagus bx= barretts esophagus  . Savory dilation N/A 06/03/2014    Procedure: SAVORY DILATION;  Surgeon: [name], MD;  [facility];  Service: Endoscopy;  Laterality: N/A;  [name] dilation N/A 06/03/2014    Procedure: [name] DILATION;  Surgeon: [name], MD;  [facility];  Service: Endoscopy;   Laterality: N/A;  . Esophagogastroduodenoscopy N/A 05/15/2015    Procedure: ESOPHAGOGASTRODUODENOSCOPY (EGD);  Surgeon: [name], MD;  [facility];  Service: Endoscopy;  Laterality: N/A;  230  . Colonoscopy N/A 05/15/2015    Procedure: COLONOSCOPY;  Surgeon: [name], MD;  [facility];  Service: Endoscopy;  Laterality: N/A;  [name] dilation N/A 05/15/2015    Procedure: [name] DILATION;  Surgeon: [name], MD;  [facility];  Service: Endoscopy;  Laterality: N/A;   Family History  Problem Relation Age of Onset  . Hypertension Mother   . Arthritis Mother   . Pneumonia Mother   . Hypertension Father   . Heart attack Father    Social History  Substance Use Topics  . Smoking status: Former Smoker -- 0.50 packs/day for 40 years    Types: Cigarettes    Quit date: 12/20/2012  . Smokeless tobacco: Never Used  . Alcohol Use: No    Review of Systems  Constitutional: Negative for appetite change.  Respiratory: Negative for shortness of breath.   Cardiovascular: Negative for chest pain.  Gastrointestinal: Positive for nausea, vomiting, abdominal pain and constipation.  Genitourinary: Negative for dysuria.  Musculoskeletal: Negative for back pain.  Skin: Negative for wound.  Neurological: Negative for headaches.  Hematological: Negative for adenopathy.      Allergies  Review of patient's allergies indicates no known allergies.  Home Medications   Prior to Admission medications   Medication Sig Start Date End Date Taking? Authorizing Provider  albuterol (PROAIR HFA) 108 (90 BASE) MCG/ACT inhaler Inhale into the lungs every 6 (six) hours as needed for wheezing or shortness of breath.   Yes Historical Provider, MD  alendronate (FOSAMAX) 70 MG tablet Take 70 mg by mouth every 7 (seven) days. On sundays 10/14/11  Yes Historical Provider, MD  atorvastatin (LIPITOR) 20 MG tablet Take 20 mg by mouth daily.   Yes Historical Provider, MD   cholecalciferol (VITAMIN D) 1000 UNITS tablet Take 1,000 Units by mouth every morning.   Yes Historical Provider, MD  cyanocobalamin (,VITAMIN B-12,) 1000 MCG/ML injection Inject 1,000 mcg into the muscle every 30 (thirty) days.   Yes Historical Provider, MD  cyclobenzaprine (FLEXERIL) 10 MG tablet Take 10 mg by mouth daily as needed for muscle spasms.  11/18/11  Yes Historical Provider, MD  dexlansoprazole (DEXILANT) 60 MG capsule Take 1 capsule (60 mg total) by mouth every morning. 06/11/14  Yes Orvil Feil, NP  docusate sodium (COLACE) 100 MG capsule Take 100 mg by mouth 2 (two) times daily.   Yes Historical Provider, MD  enoxaparin (LOVENOX) 60 MG/0.6ML injection Inject 60 mg into the skin every evening.    Yes Historical Provider, MD  ENSURE PLUS (ENSURE PLUS) LIQD Take 237 mLs by mouth daily. Every other day   Yes Historical Provider, MD  ferrous sulfate 325 (65 FE) MG tablet Take 325 mg by mouth daily with breakfast.   Yes Historical Provider, MD  folic  acid (FOLVITE) 1 MG tablet Take 1 mg by mouth every morning.    Yes Historical Provider, MD  levETIRAcetam (KEPPRA) 750 MG tablet Take 750 mg by mouth 2 (two) times daily.   Yes Historical Provider, MD  lidocaine-prilocaine (EMLA) cream Apply 1 application topically as needed. Patient taking differently: Apply 1 application topically as needed (for port access).  03/29/14  Yes Patrici Ranks, MD  LORazepam (ATIVAN) 1 MG tablet Take 1 mg by mouth every 4 (four) hours as needed. anxiety   Yes Historical Provider, MD  Melatonin 10 MG CAPS Take 10 mg by mouth at bedtime as needed (for sleep).    Yes Historical Provider, MD  Multiple Vitamins-Minerals (MULTIVITAMINS THER. W/MINERALS) TABS tablet Take 1 tablet by mouth daily.   Yes Historical Provider, MD  ondansetron (ZOFRAN) 4 MG tablet Take 1 tablet (4 mg total) by mouth every 8 (eight) hours as needed for nausea. 02/22/14  Yes Aviva Signs, MD  polyethylene glycol powder (GLYCOLAX/MIRALAX) powder  Take 17 g by mouth daily as needed for mild constipation or moderate constipation. For constipation   Yes Historical Provider, MD  Probiotic Product (PROBIOTIC DAILY) CAPS Take 1 capsule by mouth daily.   Yes Historical Provider, MD  prochlorperazine (COMPAZINE) 10 MG tablet Take 10 mg by mouth every 6 (six) hours as needed. Nausea and Vomiting   Yes Historical Provider, MD  psyllium (REGULOID) 0.52 G capsule Take 5 capsules by mouth 2 (two) times daily.    Yes Historical Provider, MD  sertraline (ZOLOFT) 100 MG tablet Take 150 mg by mouth at bedtime.    Yes Historical Provider, MD  sucralfate (CARAFATE) 1 G tablet Take 1 g by mouth 4 (four) times daily.   Yes Historical Provider, MD  thiamine 100 MG tablet Take 100 mg by mouth every morning.    Yes Historical Provider, MD  valsartan-hydrochlorothiazide (DIOVAN-HCT) 80-12.5 MG per tablet Take 1 tablet by mouth every morning.    Yes Historical Provider, MD  Vitamin D, Ergocalciferol, (DRISDOL) 50000 UNITS CAPS capsule Take 1 capsule (50,000 Units total) by mouth once a week. Takes on Sunday 03/14/15  Yes Manon Hilding Kefalas, PA-C  Wheat Dextrin (BENEFIBER PO) Take 1 each by mouth daily as needed (to regulate bowel movements/for supplement).    Yes Historical Provider, MD  polyethylene glycol-electrolytes (TRILYTE) 420 g solution Take 4,000 mLs by mouth as directed. Patient not taking: Reported on 07/07/2015 04/25/15   [name], MD  promethazine (PHENERGAN) 25 MG tablet Take 1 tablet (25 mg total) by mouth every 6 (six) hours as needed for nausea. 07/07/15   Davonna Belling, MD   BP 122/88 mmHg  Pulse 94  Temp(Src) 97.7 F (36.5 C) (Oral)  Resp 18  Wt 155 lb (70.308 kg)  SpO2 98% Physical Exam  Constitutional: He appears well-developed.  HENT:  Head: Atraumatic.  Cardiovascular: Normal rate.   Pulmonary/Chest: Effort normal.  Abdominal:  Mild left-sided tenderness. Midline abdominal scar. No palpated hernias.  Musculoskeletal: Normal range  of motion.  Neurological: He is alert.  Skin: Skin is warm.    ED Course  Procedures (including critical care time) Labs Review Labs Reviewed  COMPREHENSIVE METABOLIC PANEL - Abnormal; Notable for the following:    Glucose, Bld 125 (*)    All other components within normal limits  LIPASE, BLOOD  CBC    Imaging Review Dg Abd Acute W/chest  07/07/2015  CLINICAL DATA:  Left-sided abdominal pain EXAM: DG ABDOMEN ACUTE W/ 1V CHEST COMPARISON:  None. FINDINGS: Cardiac shadow is within normal limits. Left chest wall port is seen. The lungs are well-aerated without focal infiltrate or sizable effusion. Postsurgical changes are noted in the abdomen. Scattered large and small bowel gas is seen. No abnormal mass is identified. No definitive abnormal calcifications are noted. No free air is noted. IMPRESSION: No acute abnormality is noted. Electronically Signed   By: Inez Catalina M.D.   On: 07/07/2015 19:51   I have personally reviewed and evaluated these images and lab results as part of my medical decision-making.   EKG Interpretation None      MDM   Final diagnoses:  Left upper quadrant pain  Non-intractable vomiting with nausea, vomiting of unspecified type    Patient with abdominal pain nausea and vomiting. Previous history of bowel obstructions. Lab work reassuring. X-ray done and does not show obstruction. Patient is tolerated orals. Will discharge home and follow-up as needed. Doubt severe obstruction this time but he is at risk with his multiple previous abdominal surgeries and obstructions.    Davonna Belling, MD 07/07/15 2156

## 2015-07-07 NOTE — Discharge Instructions (Signed)
Abdominal Pain, Adult °Many things can cause abdominal pain. Usually, abdominal pain is not caused by a disease and will improve without treatment. It can often be observed and treated at home. Your health care provider will do a physical exam and possibly order blood tests and X-rays to help determine the seriousness of your pain. However, in many cases, more time must pass before a clear cause of the pain can be found. Before that point, your health care provider may not know if you need more testing or further treatment. °HOME CARE INSTRUCTIONS °Monitor your abdominal pain for any changes. The following actions may help to alleviate any discomfort you are experiencing: °· Only take over-the-counter or prescription medicines as directed by your health care provider. °· Do not take laxatives unless directed to do so by your health care provider. °· Try a clear liquid diet (broth, tea, or water) as directed by your health care provider. Slowly move to a bland diet as tolerated. °SEEK MEDICAL CARE IF: °· You have unexplained abdominal pain. °· You have abdominal pain associated with nausea or diarrhea. °· You have pain when you urinate or have a bowel movement. °· You experience abdominal pain that wakes you in the night. °· You have abdominal pain that is worsened or improved by eating food. °· You have abdominal pain that is worsened with eating fatty foods. °· You have a fever. °SEEK IMMEDIATE MEDICAL CARE IF: °· Your pain does not go away within 2 hours. °· You keep throwing up (vomiting). °· Your pain is felt only in portions of the abdomen, such as the right side or the left lower portion of the abdomen. °· You pass bloody or black tarry stools. °MAKE SURE YOU: °· Understand these instructions. °· Will watch your condition. °· Will get help right away if you are not doing well or get worse. °  °This information is not intended to replace advice given to you by your health care provider. Make sure you discuss  any questions you have with your health care provider. °  °Document Released: 12/16/2004 Document Revised: 11/27/2014 Document Reviewed: 11/15/2012 °Elsevier Interactive Patient Education ©2016 Else[employer] ° °Nausea and Vomiting °Nausea is a sick feeling that often comes before throwing up (vomiting). Vomiting is a reflex where stomach contents come out of your mouth. Vomiting can cause severe loss of body fluids (dehydration). Children and elderly adults can become dehydrated quickly, especially if they also have diarrhea. Nausea and vomiting are symptoms of a condition or disease. It is important to find the cause of your symptoms. °CAUSES  °· Direct irritation of the stomach lining. This irritation can result from increased acid production (gastroesophageal reflux disease), infection, food poisoning, taking certain medicines (such as nonsteroidal anti-inflammatory drugs), alcohol use, or tobacco use. °· Signals from the brain. These signals could be caused by a headache, heat exposure, an inner ear disturbance, increased pressure in the brain from injury, infection, a tumor, or a concussion, pain, emotional stimulus, or metabolic problems. °· An obstruction in the gastrointestinal tract (bowel obstruction). °· Illnesses such as diabetes, hepatitis, gallbladder problems, appendicitis, kidney problems, cancer, sepsis, atypical symptoms of a heart attack, or eating disorders. °· Medical treatments such as chemotherapy and radiation. °· Receiving medicine that makes you sleep (general anesthetic) during surgery. °DIAGNOSIS °Your caregiver may ask for tests to be done if the problems do not improve after a few days. Tests may also be done if symptoms are severe or if the reason for the   nausea and vomiting is not clear. Tests may include: °· Urine tests. °· Blood tests. °· Stool tests. °· Cultures (to look for evidence of infection). °· X-rays or other imaging studies. °Test results can help your caregiver make  decisions about treatment or the need for additional tests. °TREATMENT °You need to stay well hydrated. Drink frequently but in small amounts. You may wish to drink water, sports drinks, clear broth, or eat frozen ice pops or gelatin dessert to help stay hydrated. When you eat, eating slowly may help prevent nausea. There are also some antinausea medicines that may help prevent nausea. °HOME CARE INSTRUCTIONS  °· Take all medicine as directed by your caregiver. °· If you do not have an appetite, do not force yourself to eat. However, you must continue to drink fluids. °· If you have an appetite, eat a normal diet unless your caregiver tells you differently. °¨ Eat a variety of complex carbohydrates (rice, wheat, potatoes, bread), lean meats, yogurt, fruits, and vegetables. °¨ Avoid high-fat foods because they are more difficult to digest. °· Drink enough water and fluids to keep your urine clear or pale yellow. °· If you are dehydrated, ask your caregiver for specific rehydration instructions. Signs of dehydration may include: °¨ Severe thirst. °¨ Dry lips and mouth. °¨ Dizziness. °¨ Dark urine. °¨ Decreasing urine frequency and amount. °¨ Confusion. °¨ Rapid breathing or pulse. °SEEK IMMEDIATE MEDICAL CARE IF:  °· You have blood or brown flecks (like coffee grounds) in your vomit. °· You have black or bloody stools. °· You have a severe headache or stiff neck. °· You are confused. °· You have severe abdominal pain. °· You have chest pain or trouble breathing. °· You do not urinate at least once every 8 hours. °· You develop cold or clammy skin. °· You continue to vomit for longer than 24 to 48 hours. °· You have a fever. °MAKE SURE YOU:  °· Understand these instructions. °· Will watch your condition. °· Will get help right away if you are not doing well or get worse. °  °This information is not intended to replace advice given to you by your health care provider. Make sure you discuss any questions you have with  your health care provider. °  °Document Released: 03/08/2005 Document Revised: 05/31/2011 Document Reviewed: 08/05/2010 °Elsevier Interactive Patient Education ©2016 Elsevier Inc. ° °

## 2015-07-07 NOTE — ED Notes (Signed)
Patient tolerated PO fluids

## 2015-07-07 NOTE — Progress Notes (Signed)
[name] presented for Portacath access and flush. Proper placement of portacath confirmed by CXR. Portacath located left chest wall accessed with  H 20 needle. Good blood return present. Portacath flushed with 102m NS and 500U/552mHeparin and needle removed intact. Procedure without incident. Patient tolerated procedure well.  Patient was concerned that the last time that he had his port used that it didn't give any blood and a nurse told him that they could put something in it to open it back up, I assume he was talking about Alteplase.  I did get blood return, however, it was sluggish at first so I told him that if he didn't have good blood return with the next visit, we could ask the MD about using alteplase and it would take at least 30-45 minutes.  I also told him that the best way to keep the port functioning well and with good blood return was flushing it as scheduled.  He verbalized understanding to all of this.

## 2015-07-07 NOTE — ED Notes (Signed)
Pt here for abdominal pain that began today around 11am.  Pt states that he has n/v with this.  Pt describes the abdominal pain as left sided and sharp.  Pt had similar pain in the past and it was a bowel obstruction.

## 2015-07-07 NOTE — ED Notes (Signed)
Patient verbalizes understanding of discharge instructions, prescription medications, home care and follow up care. Patient out of department at this time. 

## 2015-07-07 NOTE — Patient Instructions (Signed)
[facility] at [facility] Discharge Instructions  RECOMMENDATIONS MADE BY THE CONSULTANT AND ANY TEST RESULTS WILL BE SENT TO YOUR REFERRING PHYSICIAN.  Port flush today.    Thank you for choosing [facility] at [facility] to provide your oncology and hematology care.  To afford each patient quality time with our provider, please arrive at least 15 minutes before your scheduled appointment time.   Beginning January 23rd 2017 lab work for the Cancer Center will be done in the  Main lab at [facility] on 1st floor. If you have a lab appointment with the Cancer Center please come in thru the  Main Entrance and check in at the main information desk  You need to re-schedule your appointment should you arrive 10 or more minutes late.  We strive to give you quality time with our providers, and arriving late affects you and other patients whose appointments are after yours.  Also, if you no show three or more times for appointments you may be dismissed from the clinic at the providers discretion.     Again, thank you for choosing [facility].  Our hope is that these requests will decrease the amount of time that you wait before being seen by our physicians.       _____________________________________________________________  Should you have questions after your visit to  Cancer Center, please contact our office at [phone] between the hours of 8:30 a.m. and 4:30 p.m.  Voicemails left after 4:30 p.m. will not be returned until the following business day.  For prescription refill requests, have your pharmacy contact our office.         Resources For Cancer Patients and their Caregivers ? American Cancer Society: Can assist with transportation, wigs, general needs, runs Look Good Feel Better.        1-888-227-6333 ? Cancer Care: Provides financial assistance, online support groups, medication/co-pay assistance.   1-800-813-HOPE (4673) ? [facility] Assists [place] cancer patients and their families through emotional , educational and financial support.  336-427-4357 ? [place] DSS Where to apply for food stamps, Medicaid and utility assistance. 336-342-1394 ? [facility]: Transportation to medical appointments. 336-347-2287 ? Social Security Administration: May apply for disability if have a Stage IV cancer. 336-342-7796 1-800-772-1213 ? [place] Aging, Disability and Transit Services: Assists with nutrition, care and transit needs. [phone]  

## 2015-08-28 ENCOUNTER — Other Ambulatory Visit (HOSPITAL_COMMUNITY): Payer: Medicare Other

## 2015-08-28 ENCOUNTER — Ambulatory Visit (HOSPITAL_COMMUNITY): Payer: Medicare Other | Admitting: Oncology

## 2015-08-28 NOTE — Assessment & Plan Note (Signed)
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  Labs today: CBC diff, CMET, iron/TIBC, ferritin, CEA, Vit D.  Return in 6 months with repeat labs: CBC diff, CMET, iron/TIBC, ferritin, CEA, Vit D.  He is having repeated CT imaging for his SBOs and hospitalizations.  As a result, I will not preemptively order further imaging until after appointments to avoid excess radiation exposure.  He does need annual CT imaging per NCCN guidelines.  Accordingly, he will need 1 more surveillance CT imaging in December 2017 to complete 5 years worth of imaging surveillance.   Last colonoscopy was in Feb 2017 by Dr. [name] with normal findings and negative pathology.  Next colonoscopy is due in 2022.  Return in 6 months for follow-up.  AT that time, we will set the patient up for his final CT abd/pelvis imaging to complete 5 years worth of surveillance.

## 2015-08-28 NOTE — Progress Notes (Signed)
  NO SHOW     ROS  This encounter was created in error - please disregard.

## 2015-09-11 ENCOUNTER — Ambulatory Visit (HOSPITAL_COMMUNITY): Payer: Medicare Other | Admitting: Oncology

## 2015-10-28 ENCOUNTER — Encounter (HOSPITAL_COMMUNITY): Payer: Self-pay | Admitting: Oncology

## 2015-10-28 ENCOUNTER — Encounter (HOSPITAL_BASED_OUTPATIENT_CLINIC_OR_DEPARTMENT_OTHER): Payer: Medicare Other

## 2015-10-28 ENCOUNTER — Encounter (HOSPITAL_COMMUNITY): Payer: Medicare Other | Attending: Oncology | Admitting: Oncology

## 2015-10-28 VITALS — BP 118/75 | HR 95 | Temp 97.8°F | Resp 16 | Wt 150.1 lb

## 2015-10-28 DIAGNOSIS — Z903 Acquired absence of stomach [part of]: Secondary | ICD-10-CM | POA: Insufficient documentation

## 2015-10-28 DIAGNOSIS — C189 Malignant neoplasm of colon, unspecified: Secondary | ICD-10-CM

## 2015-10-28 DIAGNOSIS — E611 Iron deficiency: Secondary | ICD-10-CM | POA: Diagnosis not present

## 2015-10-28 DIAGNOSIS — E559 Vitamin D deficiency, unspecified: Secondary | ICD-10-CM

## 2015-10-28 LAB — CBC WITH DIFFERENTIAL/PLATELET
BASOS ABS: 0 10*3/uL (ref 0.0–0.1)
Basophils Relative: 0 %
EOS ABS: 0 10*3/uL (ref 0.0–0.7)
EOS PCT: 0 %
HCT: 47.4 % (ref 39.0–52.0)
Hemoglobin: 15.9 g/dL (ref 13.0–17.0)
Lymphocytes Relative: 18 %
Lymphs Abs: 1.8 10*3/uL (ref 0.7–4.0)
MCH: 29.4 pg (ref 26.0–34.0)
MCHC: 33.5 g/dL (ref 30.0–36.0)
MCV: 87.6 fL (ref 78.0–100.0)
Monocytes Absolute: 1 10*3/uL (ref 0.1–1.0)
Monocytes Relative: 10 %
Neutro Abs: 7.2 10*3/uL (ref 1.7–7.7)
Neutrophils Relative %: 72 %
PLATELETS: 215 10*3/uL (ref 150–400)
RBC: 5.41 MIL/uL (ref 4.22–5.81)
RDW: 13.6 % (ref 11.5–15.5)
WBC: 10.1 10*3/uL (ref 4.0–10.5)

## 2015-10-28 LAB — COMPREHENSIVE METABOLIC PANEL
ALT: 11 U/L — AB (ref 17–63)
AST: 21 U/L (ref 15–41)
Albumin: 4.2 g/dL (ref 3.5–5.0)
Alkaline Phosphatase: 94 U/L (ref 38–126)
Anion gap: 10 (ref 5–15)
BUN: 13 mg/dL (ref 6–20)
CHLORIDE: 102 mmol/L (ref 101–111)
CO2: 24 mmol/L (ref 22–32)
CREATININE: 1.3 mg/dL — AB (ref 0.61–1.24)
Calcium: 8.7 mg/dL — ABNORMAL LOW (ref 8.9–10.3)
GFR calc non Af Amer: 56 mL/min — ABNORMAL LOW (ref >60)
Glucose, Bld: 102 mg/dL — ABNORMAL HIGH (ref 65–99)
POTASSIUM: 3.8 mmol/L (ref 3.5–5.1)
SODIUM: 136 mmol/L (ref 135–145)
Total Bilirubin: 0.5 mg/dL (ref 0.3–1.2)
Total Protein: 7.7 g/dL (ref 6.5–8.1)

## 2015-10-28 MED ORDER — HEPARIN SOD (PORK) LOCK FLUSH 100 UNIT/ML IV SOLN
INTRAVENOUS | Status: AC
  Filled 2015-10-28: qty 5

## 2015-10-28 MED ORDER — SODIUM CHLORIDE 0.9% FLUSH
10.0000 mL | INTRAVENOUS | Status: DC | PRN
Start: 2015-10-28 — End: 2015-10-28
  Administered 2015-10-28: 10 mL via INTRAVENOUS
  Filled 2015-10-28: qty 10

## 2015-10-28 MED ORDER — HEPARIN SOD (PORK) LOCK FLUSH 100 UNIT/ML IV SOLN
500.0000 [IU] | Freq: Once | INTRAVENOUS | Status: AC
  Administered 2015-10-28: 500 [IU] via INTRAVENOUS

## 2015-10-28 NOTE — Patient Instructions (Signed)
[place] at [facility] Discharge Instructions  RECOMMENDATIONS MADE BY THE CONSULTANT AND ANY TEST RESULTS WILL BE SENT TO YOUR REFERRING PHYSICIAN.  Port flush with lab draw today. Follow-up as scheduled. Call clinic for any questions or concerns  Thank you for choosing [place] at [facility] to provide your oncology and hematology care.  To afford each patient quality time with our provider, please arrive at least 15 minutes before your scheduled appointment time.   Beginning January 23rd 2017 lab work for the [facility] will be done in the  Main lab at [facility] on 1st floor. If you have a lab appointment with the [facility] please come in thru the  Main Entrance and check in at the main information desk  You need to re-schedule your appointment should you arrive 10 or more minutes late.  We strive to give you quality time with our providers, and arriving late affects you and other patients whose appointments are after yours.  Also, if you no show three or more times for appointments you may be dismissed from the clinic at the providers discretion.     Again, thank you for choosing [facility].  Our hope is that these requests will decrease the amount of time that you wait before being seen by our physicians.       _____________________________________________________________  Should you have questions after your visit to [facility], please contact our office at [phone] between the hours of 8:30 a.m. and 4:30 p.m.  Voicemails left after 4:30 p.m. will not be returned until the following business day.  For prescription refill requests, have your pharmacy contact our office.         Resources For Cancer Patients and their Caregivers ? American Cancer Society: Can assist with transportation, wigs, general needs, runs Look Good Feel Better.        (319)696-1988 ? Cancer Care: Provides financial  assistance, online support groups, medication/co-pay assistance.  1-800-813-HOPE 510-255-6715) ? [facility] Assists [place] cancer patients and their families through emotional , educational and financial support.  540-534-2426 ? [place] DSS Where to apply for food stamps, Medicaid and utility assistance. 959-256-8960 ? [facility]: Transportation to medical appointments. 317 877 7121 ? Social Security Administration: May apply for disability if have a Stage IV cancer. 562-608-1490 930-840-2393 ? [employer], Disability and Transit Services: Assists with nutrition, care and transit needs. [place] Support Programs: '@10RELATIVEDAYS'$ @ > Cancer Support Group  2nd Tuesday of the month 1pm-2pm, Journey Room  > Creative Journey  3rd Tuesday of the month 1130am-1pm, Journey Room  > Look Good Feel Better  1st Wednesday of the month 10am-12 noon, Journey Room (Call [facility] to register [phone])

## 2015-10-28 NOTE — Progress Notes (Signed)
[name], MD [address] [place] [address]  Adenocarcinoma of colon with mucinous features - Plan: tamsulosin (FLOMAX) 0.4 MG CAPS capsule, CBC with Differential, Comprehensive metabolic panel, CEA  CURRENT THERAPY: Surveillance per NCCN guidelines  INTERVAL HISTORY: [name] 65 y.o. 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  Most recent EGD/colonoscopy was on 05/15/2015 by Dr. [name] with normal findings.  Next colonoscopy is recommended for 2022. AND He has a hisory of PE in November 2011 and December 2012. He is on lovenox which is managed by his primary care provider, Dr. 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  AND History of partial gastrectomy.  Today, he denies any complaints.  He does admit to 6 hospitalizations this year for SBOs.  He denies any surgeries.  He is getting admitted at multiple facilities in [place] and [place].    Review of Systems  Constitutional: Negative.  Negative for chills, fever and weight loss.  HENT: Negative.   Eyes: Negative.   Respiratory: Negative.  Negative for cough.   Cardiovascular: Negative.  Negative for chest pain.  Gastrointestinal: Negative.  Negative for nausea and vomiting.  Genitourinary: Negative.   Musculoskeletal: Negative.   Skin: Negative.   Neurological: Negative.  Negative for weakness.  Endo/Heme/Allergies: Negative.   Psychiatric/Behavioral: Negative.     Past Medical History:  Diagnosis Date  . 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 Anemia   . Anxiety   . Arthritis   . Barrett's esophagus   . Blood transfusion   . Bowel obstruction ([place]) 05/13/2012   Recurrent  . Bronchitis   . Chest pain at rest   . Chronic abdominal pain   . Erosive  esophagitis   . ETOH abuse    quit 03/2010  . GERD (gastroesophageal reflux disease)   . Hx of Clostridium difficile infection 01/2012  . Hypertension   . Ileus ([place])   . Obstruction of bowel ([place]) 03/03/14  . Osteoporosis   . Personal history of PE (pulmonary embolism) 10/01/2010  . Pneumonia   . Pulmonary embolism ([place]) 02/2010  . Recurrent upper respiratory infection (URI)   . S/P endoscopy September 28, 2010   erosive reflux esophagitis, Billroth I anatomy  . 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 Seizures ([place])   . Shortness of breath   . Sleep apnea   . TIA (transient ischemic attack) 10/11  . Vitamin B12 deficiency     Past Surgical History:  Procedure Laterality Date  . ABDOMINAL ADHESION SURGERY  03/04/15   @ [facility]  . abdominal sugery     for bowel obstruction x 8, all in 1980s, except for one in 07/2010  . APPENDECTOMY  1980s  . Billroth 1 hemigastrectomy  1980s   per patient for benign duodenal tumor  . CARDIAC CATHETERIZATION  07/17/2012  . CHOLECYSTECTOMY  1980s  . COLON SURGERY  May 2012   left hemicolectomy, colon cancer found at time of surgery for bowel obstruction  . COLONOSCOPY  03/18/2011   anastomosis at 35cm. Several adenomatous polyps removed. Sigmoid diverticulosis. Next TCS 02/2013  . COLONOSCOPY N/A 07/24/2012   ZCH:YIFOYD post segmental resection with normal-appearing colonic anastomosis aside from an adjacent polyp-removed as described  above. Rectal polyp-removed as described above. CT findings appear to have been artifactual. tubular adenomas/prolapsed type polyp.  . COLONOSCOPY N/A 05/15/2015   Procedure: COLONOSCOPY;  Surgeon: [name], MD;  [facility];  Service: Endoscopy;  Laterality: N/A;  . ESOPHAGOGASTRODUODENOSCOPY  09/28/2010  . ESOPHAGOGASTRODUODENOSCOPY  12/01/2010   Cervical web status post dilation, erosive esophagitis, B1 hemigastrectomy, inflamed anastomosis  . ESOPHAGOGASTRODUODENOSCOPY  04/16/2011   excoriation at GEJ c/w  trauma/M-W tear, friable gastric anastomosis, dilation efferent limb  . ESOPHAGOGASTRODUODENOSCOPY N/A 06/03/2014   Dr.[name]- cervcal esopphageal web s/p dilation. abnormal distal esophagus bx= barretts esophagus  . ESOPHAGOGASTRODUODENOSCOPY N/A 05/15/2015   Procedure: ESOPHAGOGASTRODUODENOSCOPY (EGD);  Surgeon: [name], MD;  Location[facility];  Service: Endoscopy;  Laterality: N/A;  230  . ESOPHAGOGASTRODUODENOSCOPY (EGD) WITH ESOPHAGEAL DILATION  02/25/2012   JSE:GBTDVVOH esophageal web-s/p dilation anddisruption as described above. Status post prior gastric with Billroth I configuration. Abnormal gastric mucosa at the anastomosis. Gastric biopsy showed mild chronic inflammation but no H. pylori   . HERNIA REPAIR     right inguinal  . MALONEY DILATION N/A 06/03/2014   Procedure: [name] DILATION;  Surgeon: [name], MD;  [facility];  Service: Endoscopy;  Laterality: N/A;  [name] DILATION N/A 05/15/2015   Procedure: [name] DILATION;  Surgeon: [name], MD;  [facility];  Service: Endoscopy;  Laterality: N/A;  . PORTACATH PLACEMENT    . SAVORY DILATION N/A 06/03/2014   Procedure: SAVORY DILATION;  Surgeon: [name], MD;  [facility];  Service: Endoscopy;  Laterality: N/A;    Family History  Problem Relation Age of Onset  . Hypertension Mother   . Arthritis Mother   . Pneumonia Mother   . Hypertension Father   . Heart attack Father     Social History   Social History  . Marital status: Married    Spouse name: N/A  . Number of children: 3  . Years of education: N/A   Occupational History  .  [employer]   Social History Main Topics  . Smoking status: Former Smoker    Packs/day: 0.50    Years: 40.00    Types: Cigarettes    Quit date: 12/20/2012  . Smokeless tobacco: Never Used  . Alcohol use No  . Drug use: No  . Sexual activity: No   Other Topics Concern  . None   Social History Narrative  . None       PHYSICAL EXAMINATION  ECOG PERFORMANCE STATUS: 1 - Symptomatic but completely ambulatory  Vitals:   10/28/15 1100  BP: 118/75  Pulse: 95  Resp: 16  Temp: 97.8 F (36.6 C)    GENERAL:alert, no distress, comfortable, cooperative, smiling and unaccompanied SKIN: skin color, texture, turgor are normal, no rashes or significant lesions HEAD: Normocephalic, No masses, lesions, tenderness or abnormalities EYES: normal, EOMI, Conjunctiva are pink and non-injected EARS: External ears normal OROPHARYNX:lips, buccal mucosa, and tongue normal and mucous membranes are moist  NECK: supple, no adenopathy, trachea midline LYMPH:  no palpable lymphadenopathy BREAST:not examined LUNGS: clear to auscultation  HEART: regular rate & rhythm, no murmurs and no gallops ABDOMEN:abdomen soft, non-tender and normal bowel sounds BACK: Back symmetric, no curvature. EXTREMITIES:less then 2 second capillary refill, no joint deformities, effusion, or inflammation, no skin discoloration, no cyanosis  NEURO: alert & oriented x 3 with fluent speech, no focal motor/sensory deficits, gait normal   LABORATORY DATA: CBC  Component Value Date/Time   WBC 10.1 10/28/2015 1300   RBC 5.41 10/28/2015 1300   HGB 15.9 10/28/2015 1300   HCT 47.4 10/28/2015 1300   PLT 215 10/28/2015 1300   MCV 87.6 10/28/2015 1300   MCH 29.4 10/28/2015 1300   MCHC 33.5 10/28/2015 1300   RDW 13.6 10/28/2015 1300   LYMPHSABS 1.8 10/28/2015 1300   MONOABS 1.0 10/28/2015 1300   EOSABS 0.0 10/28/2015 1300   BASOSABS 0.0 10/28/2015 1300      Chemistry      Component Value Date/Time   NA 143 07/07/2015 1549   K 4.4 07/07/2015 1549   CL 111 07/07/2015 1549   CO2 23 07/07/2015 1549   BUN 7 07/07/2015 1549   CREATININE 1.13 07/07/2015 1549      Component Value Date/Time   CALCIUM 9.0 07/07/2015 1549   ALKPHOS 72 07/07/2015 1549   AST 22 07/07/2015 1549   ALT 18 07/07/2015 1549   BILITOT 0.6 07/07/2015 1549     Lab  Results  Component Value Date   CEA 4.9 (H) 03/13/2015   Lab Results  Component Value Date   IRON 126 03/13/2015   TIBC 367 03/13/2015   FERRITIN 37 03/13/2015     PENDING LABS:   RADIOGRAPHIC STUDIES:  No results found.   PATHOLOGY:  Diagnosis 1. Esophagus, biopsy, nodule - REACTIVE GASTROESOPHAGEAL MUCOSA WITH MIXED INFLAMMATION. - NO INTESTINAL METAPLASIA, DYSPLASIA, OR MALIGNANCY. 2. Esophagus, biopsy - INTESTINAL METAPLASIA (GOBLET CELL METAPLASIA) CONSISTENT WITH BARRETT'S ESOPHAGUS. - NO DYSPLASIA OR MALIGNANCY. 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  Labs today: CBC diff, CMET, iron/TIBC, ferritin, CEA, Vit D.  I personally reviewed and went over laboratory results with the patient.  The results are noted within this dictation.  Chart is reviewed in detail.  Patient reports that he has been admitted 6 times this year to multiple different facilities including [place], [place].  I have reviewed CareEverywhere.  He notes that he is admitted for SBOs.  He reports that surgeons at all of these institutions are not interested in operating on him.  I am not sure why he is going to all of these different facilities.  He reports "I get in quicker" at other places.  I have searched the [place] Controlled Substance Reporting System.  No controlled substances are filled in the state of [place] in the past 6 months.  I personally reviewed and went over radiographic studies with the patient.  The results are noted within this dictation.  His last CT abd/pelvis imaging was performed in May 2017 at [facility].  It was negative for recurrence.  Return in 6  months with repeat labs: CBC diff, CMET, iron/TIBC, ferritin, CEA.  He is having repeated CT imaging for his SBOs and hospitalizations.  As a result, I will not preemptively order further imaging until after appointments to avoid excess radiation exposure.  He does need annual CT imaging per NCCN guidelines.  Accordingly, he will need 1 more surveillance CT imaging in December 2017 to complete 5 years worth of imaging surveillance.   Last colonoscopy was in Feb 2017 by Dr. [name] with normal findings and negative pathology.  Next colonoscopy is due in 2022.  Return in 6 months for follow-up.  AT THAT TIME, we will set the patient up for his final CT abd/pelvis imaging to complete 5 years worth of surveillance.   ORDERS PLACED FOR THIS ENCOUNTER: Orders Placed This Encounter  Procedures  . CBC with Differential  . Comprehensive metabolic panel  . CEA    MEDICATIONS PRESCRIBED THIS ENCOUNTER: Meds ordered this encounter  Medications  . tamsulosin (FLOMAX) 0.4 MG CAPS capsule    Sig: Take 0.4 mg by mouth.    THERAPY PLAN:  NCCN guidelines for surveillance for Colon cancer are as follows (1.2017):  A. Stage I   1. Colonoscopy at year 1    A. If advanced adenoma, repeat in 1 year    B. If no advanced adenoma, repeat in 3 years, and then every 5 years.  B. Stage II, Stage III   1. H+P every 3-6 months x 2 years and then every 6 months for a total of 5 years    2. CEA every 3-6 months x 2 years and then every 6 months for a total of 5 years    3. CT CAP every 6-12 months (category 2B for frequency < 12 months) for a total of 5 years .   4.  Colonoscopy in 1 year except if no preoperative colonoscopy due to obstructing lesion, colonoscopy in 3-6 months.     A. If advanced adenoma, repeat in 1 year    B. If no advanced adenoma, repeat in 3 years, then every 5 years   5. PET/CT scan is not recommended.  C. Stage IV   1. H+P every 3-6 months x 2 years and then every 6 months for a total of  5 years    2. CEA every 3 months x 2 years and then every 6 months for a total of 3- 5 years    3. CT CAP every 3-6 months (category 2B for frequency < 6 months) x 2 years., then every 6-12 months for a total of 5 years .   4. Colonoscopy in 1 year except if no preoperative colonoscopy due to obstructing lesion, colonoscopy in 3-6 months.     A. If advanced adenoma, repeat in 1 year    B. If no advanced adenoma, repeat in 3 years, then every 5 years   All questions were answered. The patient knows to call the clinic with any problems, questions or concerns. We can certainly see the patient much sooner if necessary.  Patient and plan discussed with Dr. Ancil Linsey and she is in agreement with the aforementioned.   This note is electronically signed by: Doy Mince 10/28/2015 6:03 PM

## 2015-10-28 NOTE — Progress Notes (Signed)
[name] tolerated port flush lab draw well without complaints. Port was accessed with blood drawn for labs then flushed with 10 ml NS and 5 ml Heparin easily and de-accessed. Pt discharged self ambulatory in satisfactory condition.

## 2015-10-28 NOTE — Assessment & Plan Note (Signed)
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  Labs today: CBC diff, CMET, iron/TIBC, ferritin, CEA, Vit D.  I personally reviewed and went over laboratory results with the patient.  The results are noted within this dictation.  Chart is reviewed in detail.  Patient reports that he has been admitted 6 times this year to multiple different facilities including [place], [place].  I have reviewed CareEverywhere.  He notes that he is admitted for SBOs.  He reports that surgeons at all of these institutions are not interested in operating on him.  I am not sure why he is going to all of these different facilities.  He reports "I get in quicker" at other places.  I have searched the [place] Controlled Substance Reporting System.  No controlled substances are filled in the state of [place] in the past 6 months.  I personally reviewed and went over radiographic studies with the patient.  The results are noted within this dictation.  His last CT abd/pelvis imaging was performed in May 2017 at [facility].  It was negative for recurrence.  Return in 6 months with repeat labs: CBC diff, CMET, iron/TIBC, ferritin, CEA.  He is having repeated CT imaging for his SBOs and hospitalizations.  As a result, I will not preemptively order further imaging until after appointments to avoid excess radiation exposure.  He does need annual CT imaging per NCCN guidelines.  Accordingly, he will need 1 more surveillance CT imaging in December 2017 to complete 5 years worth of imaging surveillance.   Last colonoscopy was in Feb 2017 by Dr. [name] with normal findings and negative pathology.  Next colonoscopy is due in 2022.  Return in 6 months for follow-up.  AT THAT TIME, we will set the patient up for his final CT abd/pelvis imaging to complete 5 years  worth of surveillance.

## 2015-10-28 NOTE — Patient Instructions (Signed)
[place] at [facility] Discharge Instructions  RECOMMENDATIONS MADE BY THE CONSULTANT AND ANY TEST RESULTS WILL BE SENT TO YOUR REFERRING PHYSICIAN.  We will flush your port today and ascertain labs. We will get labs in 6 months as well. Return in 6 months for follow-up.  Thank you for choosing [place] at [facility] to provide your oncology and hematology care.  To afford each patient quality time with our provider, please arrive at least 15 minutes before your scheduled appointment time.   Beginning January 23rd 2017 lab work for the [facility] will be done in the  Main lab at [facility] on 1st floor. If you have a lab appointment with the [facility] please come in thru the  Main Entrance and check in at the main information desk  You need to re-schedule your appointment should you arrive 10 or more minutes late.  We strive to give you quality time with our providers, and arriving late affects you and other patients whose appointments are after yours.  Also, if you no show three or more times for appointments you may be dismissed from the clinic at the providers discretion.     Again, thank you for choosing [facility].  Our hope is that these requests will decrease the amount of time that you wait before being seen by our physicians.       _____________________________________________________________  Should you have questions after your visit to [facility], please contact our office at [phone] between the hours of 8:30 a.m. and 4:30 p.m.  Voicemails left after 4:30 p.m. will not be returned until the following business day.  For prescription refill requests, have your pharmacy contact our office.         Resources For Cancer Patients and their Caregivers ? American Cancer Society: Can assist with transportation, wigs, general needs, runs Look Good Feel Better.        586-319-7402 ? Cancer  Care: Provides financial assistance, online support groups, medication/co-pay assistance.  1-800-813-HOPE (339) 121-0622) ? [facility] Assists [place] cancer patients and their families through emotional , educational and financial support.  516-114-2630 ? [place] DSS Where to apply for food stamps, Medicaid and utility assistance. 930-012-5070 ? RCATS: Transportation to medical appointments. (757)440-7307 ? Social Security Administration: May apply for disability if have a Stage IV cancer. 7160325696 636 230 9260 ? [employer], Disability and Transit Services: Assists with nutrition, care and transit needs. [place] Support Programs: '@10RELATIVEDAYS'$ @ > Cancer Support Group  2nd Tuesday of the month 1pm-2pm, Journey Room  > Creative Journey  3rd Tuesday of the month 1130am-1pm, Journey Room  > Look Good Feel Better  1st Wednesday of the month 10am-12 noon, Journey Room (Call [place] to register [phone])

## 2015-10-29 ENCOUNTER — Other Ambulatory Visit (HOSPITAL_COMMUNITY): Payer: Self-pay | Admitting: Oncology

## 2015-10-29 DIAGNOSIS — E611 Iron deficiency: Secondary | ICD-10-CM

## 2015-10-29 LAB — FERRITIN: Ferritin: 22 ng/mL — ABNORMAL LOW (ref 24–336)

## 2015-10-29 LAB — IRON AND TIBC
IRON: 138 ug/dL (ref 45–182)
SATURATION RATIOS: 32 % (ref 17.9–39.5)
TIBC: 428 ug/dL (ref 250–450)
UIBC: 290 ug/dL

## 2015-10-30 LAB — VITAMIN D 25 HYDROXY (VIT D DEFICIENCY, FRACTURES): VIT D 25 HYDROXY: 39.3 ng/mL (ref 30.0–100.0)

## 2015-10-30 LAB — CEA: CEA: 5.2 ng/mL — AB (ref 0.0–4.7)

## 2015-12-23 ENCOUNTER — Encounter (HOSPITAL_COMMUNITY): Payer: Medicare Other | Attending: Internal Medicine

## 2016-02-17 ENCOUNTER — Encounter (HOSPITAL_COMMUNITY): Payer: Medicare Other | Attending: Hematology & Oncology

## 2016-02-17 VITALS — BP 90/64 | HR 101 | Temp 98.1°F | Resp 18

## 2016-02-17 DIAGNOSIS — Z95828 Presence of other vascular implants and grafts: Secondary | ICD-10-CM

## 2016-02-17 DIAGNOSIS — C189 Malignant neoplasm of colon, unspecified: Secondary | ICD-10-CM

## 2016-02-17 DIAGNOSIS — Z452 Encounter for adjustment and management of vascular access device: Secondary | ICD-10-CM

## 2016-02-17 MED ORDER — HEPARIN SOD (PORK) LOCK FLUSH 100 UNIT/ML IV SOLN
INTRAVENOUS | Status: AC
  Filled 2016-02-17: qty 5

## 2016-02-17 MED ORDER — SODIUM CHLORIDE 0.9% FLUSH
10.0000 mL | INTRAVENOUS | Status: DC | PRN
  Administered 2016-02-17: 10 mL via INTRAVENOUS
  Filled 2016-02-17: qty 10

## 2016-02-17 MED ORDER — HEPARIN SOD (PORK) LOCK FLUSH 100 UNIT/ML IV SOLN
500.0000 [IU] | Freq: Once | INTRAVENOUS | Status: AC
  Administered 2016-02-17: 500 [IU] via INTRAVENOUS

## 2016-02-17 NOTE — Progress Notes (Signed)
[name] tolerated port flush well without complaints or incident. Port accessed with 20 gauge needle with good blood return noted. Port flushed with 10 ml NS and 5 ml Heparin easily per protocol. VSS Pt discharged self ambulatory in satisfactory condition

## 2016-02-17 NOTE — Patient Instructions (Signed)
[place] at [facility] Discharge Instructions  RECOMMENDATIONS MADE BY THE CONSULTANT AND ANY TEST RESULTS WILL BE SENT TO YOUR REFERRING PHYSICIAN.  Portacath flushed per protocol today. Follow-up as scheduled. Call clinic for any questions or concerns  Thank you for choosing [place] at [facility] to provide your oncology and hematology care.  To afford each patient quality time with our provider, please arrive at least 15 minutes before your scheduled appointment time.   Beginning January 23rd 2017 lab work for the [facility] will be done in the  Main lab at [facility] on 1st floor. If you have a lab appointment with the [facility] please come in thru the  Main Entrance and check in at the main information desk  You need to re-schedule your appointment should you arrive 10 or more minutes late.  We strive to give you quality time with our providers, and arriving late affects you and other patients whose appointments are after yours.  Also, if you no show three or more times for appointments you may be dismissed from the clinic at the providers discretion.     Again, thank you for choosing [facility].  Our hope is that these requests will decrease the amount of time that you wait before being seen by our physicians.       _____________________________________________________________  Should you have questions after your visit to [facility], please contact our office at [phone] between the hours of 8:30 a.m. and 4:30 p.m.  Voicemails left after 4:30 p.m. will not be returned until the following business day.  For prescription refill requests, have your pharmacy contact our office.         Resources For Cancer Patients and their Caregivers ? American Cancer Society: Can assist with transportation, wigs, general needs, runs Look Good Feel Better.        (704) 750-0771 ? Cancer Care: Provides  financial assistance, online support groups, medication/co-pay assistance.  1-800-813-HOPE 351-226-0503) ? [facility] Assists [place] cancer patients and their families through emotional , educational and financial support.  805-047-1773 ? [place] DSS Where to apply for food stamps, Medicaid and utility assistance. 678 470 0854 ? RCATS: Transportation to medical appointments. (250)068-1267 ? Social Security Administration: May apply for disability if have a Stage IV cancer. 778-740-2299 8174641000 ? [employer], Disability and Transit Services: Assists with nutrition, care and transit needs. [place] Support Programs: '@10RELATIVEDAYS'$ @ > Cancer Support Group  2nd Tuesday of the month 1pm-2pm, Journey Room  > Creative Journey  3rd Tuesday of the month 1130am-1pm, Journey Room  > Look Good Feel Better  1st Wednesday of the month 10am-12 noon, Journey Room (Call [place] to register [phone])

## 2016-03-17 ENCOUNTER — Other Ambulatory Visit (HOSPITAL_COMMUNITY): Payer: Self-pay | Admitting: Oncology

## 2016-03-17 DIAGNOSIS — D649 Anemia, unspecified: Secondary | ICD-10-CM

## 2016-03-19 ENCOUNTER — Emergency Department (HOSPITAL_COMMUNITY): Payer: Medicare Other

## 2016-03-19 ENCOUNTER — Encounter (HOSPITAL_COMMUNITY): Payer: Self-pay | Admitting: Emergency Medicine

## 2016-03-19 ENCOUNTER — Other Ambulatory Visit: Payer: Self-pay

## 2016-03-19 ENCOUNTER — Inpatient Hospital Stay (HOSPITAL_COMMUNITY)
Admission: EM | Admit: 2016-03-19 | Discharge: 2016-03-21 | DRG: 684 | Disposition: A | Discharge status: Home / Self Care | Payer: Medicare Other | Attending: Internal Medicine | Admitting: Internal Medicine

## 2016-03-19 ENCOUNTER — Encounter (HOSPITAL_COMMUNITY): Payer: Medicare Other | Attending: Hematology & Oncology

## 2016-03-19 ENCOUNTER — Inpatient Hospital Stay (HOSPITAL_COMMUNITY): Payer: Medicare Other

## 2016-03-19 DIAGNOSIS — E876 Hypokalemia: Secondary | ICD-10-CM | POA: Diagnosis present

## 2016-03-19 DIAGNOSIS — Z79899 Other long term (current) drug therapy: Secondary | ICD-10-CM

## 2016-03-19 DIAGNOSIS — N189 Chronic kidney disease, unspecified: Secondary | ICD-10-CM | POA: Diagnosis present

## 2016-03-19 DIAGNOSIS — T502X5A Adverse effect of carbonic-anhydrase inhibitors, benzothiadiazides and other diuretics, initial encounter: Secondary | ICD-10-CM | POA: Diagnosis present

## 2016-03-19 DIAGNOSIS — E860 Dehydration: Secondary | ICD-10-CM | POA: Diagnosis present

## 2016-03-19 DIAGNOSIS — K2270 Barrett's esophagus without dysplasia: Secondary | ICD-10-CM | POA: Diagnosis present

## 2016-03-19 DIAGNOSIS — Z8673 Personal history of transient ischemic attack (TIA), and cerebral infarction without residual deficits: Secondary | ICD-10-CM

## 2016-03-19 DIAGNOSIS — I959 Hypotension, unspecified: Secondary | ICD-10-CM | POA: Diagnosis present

## 2016-03-19 DIAGNOSIS — D649 Anemia, unspecified: Secondary | ICD-10-CM | POA: Diagnosis present

## 2016-03-19 DIAGNOSIS — Z87891 Personal history of nicotine dependence: Secondary | ICD-10-CM

## 2016-03-19 DIAGNOSIS — Z8249 Family history of ischemic heart disease and other diseases of the circulatory system: Secondary | ICD-10-CM

## 2016-03-19 DIAGNOSIS — N179 Acute kidney failure, unspecified: Secondary | ICD-10-CM | POA: Diagnosis not present

## 2016-03-19 DIAGNOSIS — K219 Gastro-esophageal reflux disease without esophagitis: Secondary | ICD-10-CM | POA: Diagnosis present

## 2016-03-19 DIAGNOSIS — I10 Essential (primary) hypertension: Secondary | ICD-10-CM

## 2016-03-19 DIAGNOSIS — Z903 Acquired absence of stomach [part of]: Secondary | ICD-10-CM | POA: Diagnosis not present

## 2016-03-19 DIAGNOSIS — I129 Hypertensive chronic kidney disease with stage 1 through stage 4 chronic kidney disease, or unspecified chronic kidney disease: Secondary | ICD-10-CM | POA: Diagnosis present

## 2016-03-19 DIAGNOSIS — N170 Acute kidney failure with tubular necrosis: Secondary | ICD-10-CM | POA: Diagnosis not present

## 2016-03-19 DIAGNOSIS — Z86711 Personal history of pulmonary embolism: Secondary | ICD-10-CM | POA: Diagnosis present

## 2016-03-19 DIAGNOSIS — G4730 Sleep apnea, unspecified: Secondary | ICD-10-CM | POA: Diagnosis present

## 2016-03-19 DIAGNOSIS — Z85048 Personal history of other malignant neoplasm of rectum, rectosigmoid junction, and anus: Secondary | ICD-10-CM

## 2016-03-19 DIAGNOSIS — M810 Age-related osteoporosis without current pathological fracture: Secondary | ICD-10-CM | POA: Diagnosis present

## 2016-03-19 DIAGNOSIS — R531 Weakness: Secondary | ICD-10-CM | POA: Diagnosis present

## 2016-03-19 DIAGNOSIS — Z85038 Personal history of other malignant neoplasm of large intestine: Secondary | ICD-10-CM

## 2016-03-19 DIAGNOSIS — R0600 Dyspnea, unspecified: Secondary | ICD-10-CM | POA: Diagnosis not present

## 2016-03-19 LAB — RETICULOCYTES
RBC.: 4.56 MIL/uL (ref 4.22–5.81)
RBC.: 4.54 MIL/uL (ref 4.22–5.81)
RETIC COUNT ABSOLUTE: 59.3 10*3/uL (ref 19.0–186.0)
RETIC CT PCT: 1.3 % (ref 0.4–3.1)
RETIC CT PCT: 1.4 % (ref 0.4–3.1)
Retic Count, Absolute: 63.6 10*3/uL (ref 19.0–186.0)

## 2016-03-19 LAB — CBC WITH DIFFERENTIAL/PLATELET
BASOS ABS: 0 10*3/uL (ref 0.0–0.1)
Basophils Absolute: 0 10*3/uL (ref 0.0–0.1)
Basophils Relative: 0 %
Basophils Relative: 0 %
EOS PCT: 2 %
Eosinophils Absolute: 0.2 10*3/uL (ref 0.0–0.7)
Eosinophils Absolute: 0.2 10*3/uL (ref 0.0–0.7)
Eosinophils Relative: 2 %
HEMATOCRIT: 37.9 % — AB (ref 39.0–52.0)
HEMATOCRIT: 33.3 % — AB (ref 39.0–52.0)
Hemoglobin: 11.9 g/dL — ABNORMAL LOW (ref 13.0–17.0)
Hemoglobin: 10.7 g/dL — ABNORMAL LOW (ref 13.0–17.0)
LYMPHS ABS: 2 10*3/uL (ref 0.7–4.0)
LYMPHS PCT: 20 %
LYMPHS PCT: 19 %
Lymphs Abs: 1.5 10*3/uL (ref 0.7–4.0)
MCH: 26.1 pg (ref 26.0–34.0)
MCH: 26.4 pg (ref 26.0–34.0)
MCHC: 31.4 g/dL (ref 30.0–36.0)
MCHC: 32.1 g/dL (ref 30.0–36.0)
MCV: 83.1 fL (ref 78.0–100.0)
MCV: 82.2 fL (ref 78.0–100.0)
MONO ABS: 1 10*3/uL (ref 0.1–1.0)
MONO ABS: 1.1 10*3/uL — AB (ref 0.1–1.0)
MONOS PCT: 10 %
MONOS PCT: 14 %
NEUTROS ABS: 6.6 10*3/uL (ref 1.7–7.7)
Neutro Abs: 5.4 10*3/uL (ref 1.7–7.7)
Neutrophils Relative %: 68 %
Neutrophils Relative %: 65 %
PLATELETS: 227 10*3/uL (ref 150–400)
Platelets: 269 10*3/uL (ref 150–400)
RBC: 4.56 MIL/uL (ref 4.22–5.81)
RBC: 4.05 MIL/uL — ABNORMAL LOW (ref 4.22–5.81)
RDW: 14.8 % (ref 11.5–15.5)
RDW: 14.9 % (ref 11.5–15.5)
WBC: 9.8 10*3/uL (ref 4.0–10.5)
WBC: 8.2 10*3/uL (ref 4.0–10.5)

## 2016-03-19 LAB — URINALYSIS, ROUTINE W REFLEX MICROSCOPIC
Bilirubin Urine: NEGATIVE
GLUCOSE, UA: 50 mg/dL — AB
HGB URINE DIPSTICK: NEGATIVE
Ketones, ur: NEGATIVE mg/dL
LEUKOCYTES UA: NEGATIVE
NITRITE: NEGATIVE
PH: 5 (ref 5.0–8.0)
PROTEIN: 100 mg/dL — AB
Specific Gravity, Urine: 1.016 (ref 1.005–1.030)

## 2016-03-19 LAB — COMPREHENSIVE METABOLIC PANEL
ALT: 29 U/L (ref 17–63)
ANION GAP: 9 (ref 5–15)
AST: 23 U/L (ref 15–41)
Albumin: 3.6 g/dL (ref 3.5–5.0)
Alkaline Phosphatase: 110 U/L (ref 38–126)
BILIRUBIN TOTAL: 0.4 mg/dL (ref 0.3–1.2)
BUN: 22 mg/dL — ABNORMAL HIGH (ref 6–20)
CHLORIDE: 103 mmol/L (ref 101–111)
CO2: 23 mmol/L (ref 22–32)
Calcium: 7.5 mg/dL — ABNORMAL LOW (ref 8.9–10.3)
Creatinine, Ser: 4.98 mg/dL — ABNORMAL HIGH (ref 0.61–1.24)
GFR, EST AFRICAN AMERICAN: 13 mL/min — AB (ref >60)
GFR, EST NON AFRICAN AMERICAN: 11 mL/min — AB (ref >60)
Glucose, Bld: 81 mg/dL (ref 65–99)
POTASSIUM: 3.7 mmol/L (ref 3.5–5.1)
Sodium: 135 mmol/L (ref 135–145)
TOTAL PROTEIN: 6.1 g/dL — AB (ref 6.5–8.1)

## 2016-03-19 LAB — TROPONIN I

## 2016-03-19 LAB — BASIC METABOLIC PANEL
Anion gap: 10 (ref 5–15)
BUN: 22 mg/dL — ABNORMAL HIGH (ref 6–20)
CALCIUM: 7.9 mg/dL — AB (ref 8.9–10.3)
CO2: 23 mmol/L (ref 22–32)
CREATININE: 4.96 mg/dL — AB (ref 0.61–1.24)
Chloride: 102 mmol/L (ref 101–111)
GFR calc Af Amer: 13 mL/min — ABNORMAL LOW (ref >60)
GFR calc non Af Amer: 11 mL/min — ABNORMAL LOW (ref >60)
Glucose, Bld: 115 mg/dL — ABNORMAL HIGH (ref 65–99)
Potassium: 3.3 mmol/L — ABNORMAL LOW (ref 3.5–5.1)
Sodium: 135 mmol/L (ref 135–145)

## 2016-03-19 LAB — FOLATE: FOLATE: 87 ng/mL (ref >5.9)

## 2016-03-19 LAB — CBG MONITORING, ED: GLUCOSE-CAPILLARY: 120 mg/dL — AB (ref 65–99)

## 2016-03-19 LAB — LACTATE DEHYDROGENASE: LDH: 224 U/L — AB (ref 98–192)

## 2016-03-19 LAB — SODIUM, URINE, RANDOM: SODIUM UR: 78 mmol/L

## 2016-03-19 LAB — IRON AND TIBC
Iron: 31 ug/dL — ABNORMAL LOW (ref 45–182)
SATURATION RATIOS: 6 % — AB (ref 17.9–39.5)
TIBC: 479 ug/dL — ABNORMAL HIGH (ref 250–450)
UIBC: 448 ug/dL

## 2016-03-19 LAB — D-DIMER, QUANTITATIVE (NOT AT ARMC): D DIMER QUANT: 0.36 ug{FEU}/mL (ref 0.00–0.50)

## 2016-03-19 LAB — VITAMIN B12: VITAMIN B 12: 386 pg/mL (ref 180–914)

## 2016-03-19 LAB — CREATININE, URINE, RANDOM: CREATININE, URINE: 274.23 mg/dL

## 2016-03-19 LAB — FERRITIN: Ferritin: 10 ng/mL — ABNORMAL LOW (ref 24–336)

## 2016-03-19 MED ORDER — SERTRALINE HCL 50 MG PO TABS
150.0000 mg | ORAL_TABLET | Freq: Every day | ORAL | Status: DC
  Administered 2016-03-19 – 2016-03-20 (×2): 150 mg via ORAL
  Filled 2016-03-19 (×2): qty 3

## 2016-03-19 MED ORDER — RISAQUAD PO CAPS
1.0000 | ORAL_CAPSULE | Freq: Every day | ORAL | Status: DC
  Administered 2016-03-20 – 2016-03-21 (×2): 1 via ORAL
  Filled 2016-03-19 (×2): qty 1

## 2016-03-19 MED ORDER — VITAMIN D 1000 UNITS PO TABS
1000.0000 [IU] | ORAL_TABLET | Freq: Every day | ORAL | Status: DC
  Administered 2016-03-20 – 2016-03-21 (×2): 1000 [IU] via ORAL
  Filled 2016-03-19 (×2): qty 1

## 2016-03-19 MED ORDER — HYDROCODONE-ACETAMINOPHEN 5-325 MG PO TABS
1.0000 | ORAL_TABLET | ORAL | Status: DC | PRN
  Administered 2016-03-19 (×2): 2 via ORAL
  Filled 2016-03-19 (×2): qty 2

## 2016-03-19 MED ORDER — ADULT MULTIVITAMIN W/MINERALS CH
1.0000 | ORAL_TABLET | Freq: Every day | ORAL | Status: DC
  Administered 2016-03-20 – 2016-03-21 (×2): 1 via ORAL
  Filled 2016-03-19 (×2): qty 1

## 2016-03-19 MED ORDER — SODIUM CHLORIDE 0.9 % IV SOLN
INTRAVENOUS | Status: DC
  Administered 2016-03-19 – 2016-03-20 (×2): via INTRAVENOUS

## 2016-03-19 MED ORDER — PANTOPRAZOLE SODIUM 40 MG PO TBEC
80.0000 mg | DELAYED_RELEASE_TABLET | Freq: Every day | ORAL | Status: DC
  Administered 2016-03-20 – 2016-03-21 (×2): 80 mg via ORAL
  Filled 2016-03-19 (×2): qty 2

## 2016-03-19 MED ORDER — VITAMIN B-1 100 MG PO TABS
100.0000 mg | ORAL_TABLET | Freq: Every day | ORAL | Status: DC
  Administered 2016-03-21: 100 mg via ORAL
  Filled 2016-03-19 (×2): qty 1

## 2016-03-19 MED ORDER — ALBUTEROL SULFATE (2.5 MG/3ML) 0.083% IN NEBU: 3.0000 mL | INHALATION_SOLUTION | Freq: Four times a day (QID) | RESPIRATORY_TRACT | Status: DC | PRN

## 2016-03-19 MED ORDER — DOCUSATE SODIUM 100 MG PO CAPS
100.0000 mg | ORAL_CAPSULE | Freq: Two times a day (BID) | ORAL | Status: DC
  Administered 2016-03-19 – 2016-03-21 (×3): 100 mg via ORAL
  Filled 2016-03-19 (×4): qty 1

## 2016-03-19 MED ORDER — ENOXAPARIN SODIUM 60 MG/0.6ML ~~LOC~~ SOLN
60.0000 mg | Freq: Every day | SUBCUTANEOUS | Status: DC
  Administered 2016-03-19 – 2016-03-20 (×2): 60 mg via SUBCUTANEOUS
  Filled 2016-03-19 (×2): qty 0.6

## 2016-03-19 MED ORDER — LORAZEPAM 1 MG PO TABS
1.0000 mg | ORAL_TABLET | ORAL | Status: DC | PRN
  Administered 2016-03-20 (×2): 1 mg via ORAL
  Filled 2016-03-19 (×2): qty 1

## 2016-03-19 MED ORDER — FERROUS SULFATE 325 (65 FE) MG PO TABS
325.0000 mg | ORAL_TABLET | Freq: Every day | ORAL | Status: DC
  Administered 2016-03-20 – 2016-03-21 (×2): 325 mg via ORAL
  Filled 2016-03-19 (×3): qty 1

## 2016-03-19 MED ORDER — POLYETHYLENE GLYCOL 3350 17 G PO PACK: 17.0000 g | PACK | Freq: Every day | ORAL | Status: DC | PRN

## 2016-03-19 MED ORDER — ONDANSETRON HCL 4 MG PO TABS: 4.0000 mg | ORAL_TABLET | Freq: Four times a day (QID) | ORAL | Status: DC | PRN

## 2016-03-19 MED ORDER — ACETAMINOPHEN 500 MG PO TABS: 1000.0000 mg | ORAL_TABLET | Freq: Four times a day (QID) | ORAL | Status: DC | PRN

## 2016-03-19 MED ORDER — LEVETIRACETAM 250 MG PO TABS
250.0000 mg | ORAL_TABLET | Freq: Two times a day (BID) | ORAL | Status: DC
  Administered 2016-03-19 – 2016-03-21 (×4): 250 mg via ORAL
  Filled 2016-03-19 (×4): qty 1

## 2016-03-19 MED ORDER — ONDANSETRON HCL 4 MG/2ML IJ SOLN: 4.0000 mg | Freq: Four times a day (QID) | INTRAMUSCULAR | Status: DC | PRN

## 2016-03-19 MED ORDER — MAGNESIUM OXIDE 400 (241.3 MG) MG PO TABS
400.0000 mg | ORAL_TABLET | Freq: Two times a day (BID) | ORAL | Status: DC
  Administered 2016-03-19 – 2016-03-21 (×4): 400 mg via ORAL
  Filled 2016-03-19 (×4): qty 1

## 2016-03-19 MED ORDER — TAMSULOSIN HCL 0.4 MG PO CAPS
0.4000 mg | ORAL_CAPSULE | Freq: Every day | ORAL | Status: DC
  Administered 2016-03-19 – 2016-03-20 (×2): 0.4 mg via ORAL
  Filled 2016-03-19 (×2): qty 1

## 2016-03-19 MED ORDER — ATORVASTATIN CALCIUM 20 MG PO TABS
20.0000 mg | ORAL_TABLET | Freq: Every day | ORAL | Status: DC
  Administered 2016-03-19 – 2016-03-20 (×2): 20 mg via ORAL
  Filled 2016-03-19 (×2): qty 1

## 2016-03-19 NOTE — ED Notes (Signed)
EKG given to DR. Yelverton.

## 2016-03-19 NOTE — ED Notes (Addendum)
Pt states that he has pressure in the back of his head that is constant that runs down the back of his neck into both shoulders.  Pt states he has been sob for two day and symptoms have progressively gotten worse.  Pt O2 saturation is 100%.  Pt also states that he has been dizzy, and denies chest pain, cough, and losing consciousness.

## 2016-03-19 NOTE — ED Triage Notes (Addendum)
Pt reports sob, pain in bilateral shoulders, dizziness,h/a, weakness in bilateral arms and legs for a few days.  PT alert and oriented at this time, no labored breathing at this time, 02 sats 100% on RA.  Denies cough and cp

## 2016-03-19 NOTE — ED Provider Notes (Addendum)
[facility] DEPT Provider Note   CSN: [id number] Arrival date & time: 03/19/16  1156     History   Chief Complaint Chief Complaint  Patient presents with  . Shortness of Breath    HPI [name] is a 65 y.o. male.  Patient presents was dyspnea and weakness for 2 days with associated dizziness.  Labs were ordered by his primary care doctor today. Creatinine was noted to be elevated which is a new finding. Status post history of colon cancer in 2012.  He has many other chronic health problems well documented in the past history. No fever, chills, chest pain, neurological deficits.  Last creatinine documented in the chart was 1.30 on 10/28/15      Past Medical History:  Diagnosis Date  . 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 Anemia   . Anxiety   . Arthritis   . Barrett's esophagus   . Blood transfusion   . Bowel obstruction 05/13/2012   Recurrent  . Bronchitis   . Chest pain at rest   . Chronic abdominal pain   . Erosive esophagitis   . ETOH abuse    quit 03/2010  . GERD (gastroesophageal reflux disease)   . Hx of Clostridium difficile infection 01/2012  . Hypertension   . Ileus ([place])   . Obstruction of bowel 03/03/14  . Osteoporosis   . Personal history of PE (pulmonary embolism) 10/01/2010  . Pneumonia   . Pulmonary embolism ([place]) 02/2010  . Recurrent upper respiratory infection (URI)   . S/P endoscopy September 28, 2010   erosive reflux esophagitis, Billroth I anatomy  . 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 Seizures ([place])   . Shortness of breath   . Sleep apnea   . TIA (transient ischemic attack) 10/11  . Vitamin B12 deficiency     Patient Active Problem List   Diagnosis Date Noted  . ESRD (end stage renal disease) ([place]) 03/19/2016  . Hx of colon cancer, stage III   . History of colonic polyps   . Barrett's esophagus without dysplasia   . Dysphagia   . Essential hypertension 11/30/2014  . Dysphagia, pharyngoesophageal phase    . Mucosal abnormality of esophagus   . SBO (small bowel obstruction) 09/17/2013  . Rectal bleeding 07/11/2012  . Bowel obstruction 07/11/2012  . Abnormal CT scan, colon 07/11/2012  . Esophageal dysphagia 02/22/2012  . H/O Clostridium difficile infection 02/22/2012  . Diarrhea 09/10/2011  . Cellulitis 09/10/2011  . Nausea 06/07/2011  . Abdominal distension 06/07/2011  . HTN (hypertension) 06/07/2011  . Partial small bowel obstruction ([place]) 05/31/2011  . GI bleed 05/27/2011  . Coagulopathy ([place]) 05/27/2011  . Gastroenteritis 05/27/2011  . Small bowel obstruction ([place]) 05/13/2011  . Left sided abdominal pain 05/13/2011  . Pulmonary embolism ([place]) 01/17/2011  . Chest pain at rest 01/17/2011  . Pneumonia 12/11/2010  . Hematemesis 12/05/2010  . Chronic abdominal pain 12/05/2010  . Pancytopenia due to antineoplastic chemotherapy ([place]) 12/05/2010  . Coffee ground emesis 11/29/2010  . Esophageal reflux disease 11/15/2010  . 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 Personal history of PE (pulmonary embolism) 10/01/2010  . Bronchitis, acute 10/01/2010  . Erosive esophagitis 10/01/2010  . Fever chills 09/29/2010  . Bleeding gastrointestinal 09/26/2010  . Supratherapeutic INR 09/26/2010  . Abdominal pain, other specified site 09/26/2010  . Nausea and vomiting in adult 09/26/2010  . Anemia, chronic disease 09/26/2010  . Adenocarcinoma of colon with mucinous features 09/22/2010  Past Surgical History:  Procedure Laterality Date  . ABDOMINAL ADHESION SURGERY  03/04/15   @ [facility]  . ABDOMINAL EXPLORATION SURGERY    . abdominal sugery     for bowel obstruction x 8, all in 1980s, except for one in 07/2010  . APPENDECTOMY  1980s  . Billroth 1 hemigastrectomy  1980s   per patient for benign duodenal tumor  . CARDIAC CATHETERIZATION  07/17/2012  . CHOLECYSTECTOMY  1980s  . COLON SURGERY  May 2012   left hemicolectomy, colon cancer found at time of surgery for bowel obstruction  .  COLONOSCOPY  03/18/2011   anastomosis at 35cm. Several adenomatous polyps removed. Sigmoid diverticulosis. Next TCS 02/2013  . COLONOSCOPY N/A 07/24/2012   GQQ:PYPPJK post segmental resection with normal-appearing colonic anastomosis aside from an adjacent polyp-removed as described above. Rectal polyp-removed as described above. CT findings appear to have been artifactual. tubular adenomas/prolapsed type polyp.  . COLONOSCOPY N/A 05/15/2015   Procedure: COLONOSCOPY;  Surgeon: [name], MD;  Location: [facility];  Service: Endoscopy;  Laterality: N/A;  . ESOPHAGOGASTRODUODENOSCOPY  09/28/2010  . ESOPHAGOGASTRODUODENOSCOPY  12/01/2010   Cervical web status post dilation, erosive esophagitis, B1 hemigastrectomy, inflamed anastomosis  . ESOPHAGOGASTRODUODENOSCOPY  04/16/2011   excoriation at GEJ c/w trauma/M-W tear, friable gastric anastomosis, dilation efferent limb  . ESOPHAGOGASTRODUODENOSCOPY N/A 06/03/2014   Dr.[name]- cervcal esopphageal web s/p dilation. abnormal distal esophagus bx= barretts esophagus  . ESOPHAGOGASTRODUODENOSCOPY N/A 05/15/2015   Procedure: ESOPHAGOGASTRODUODENOSCOPY (EGD);  Surgeon: [name], MD;  Location: [facility];  Service: Endoscopy;  Laterality: N/A;  230  . ESOPHAGOGASTRODUODENOSCOPY (EGD) WITH ESOPHAGEAL DILATION  02/25/2012   DTO:[id number] esophageal web-s/p dilation anddisruption as described above. Status post prior gastric with Billroth I configuration. Abnormal gastric mucosa at the anastomosis. Gastric biopsy showed mild chronic inflammation but no H. pylori   . HERNIA REPAIR     right inguinal  . MALONEY DILATION N/A 06/03/2014   Procedure: [name] DILATION;  Surgeon: [name], MD;  Location: [facility];  Service: Endoscopy;  Laterality: N/A;  [name] DILATION N/A 05/15/2015   Procedure: [name] DILATION;  Surgeon: [name], MD;  Location: [facility];  Service: Endoscopy;  Laterality: N/A;  . PORTACATH PLACEMENT    . SAVORY  DILATION N/A 06/03/2014   Procedure: SAVORY DILATION;  Surgeon: [name], MD;  Location: [facility];  Service: Endoscopy;  Laterality: N/A;       Home Medications    Prior to Admission medications   Medication Sig Start Date End Date Taking? Authorizing Provider  acetaminophen (TYLENOL) 500 MG tablet Take 1,000 mg by mouth every 6 (six) hours as needed for mild pain, moderate pain or headache.   Yes Historical Provider, MD  acidophilus (RISAQUAD) CAPS capsule Take 1 capsule by mouth daily.   Yes Historical Provider, MD  albuterol (PROAIR HFA) 108 (90 BASE) MCG/ACT inhaler Inhale 1-2 puffs into the lungs every 6 (six) hours as needed for wheezing or shortness of breath.    Yes Historical Provider, MD  atorvastatin (LIPITOR) 20 MG tablet Take 20 mg by mouth at bedtime.    Yes Historical Provider, MD  cholecalciferol (VITAMIN D) 1000 UNITS tablet Take 1,000 Units by mouth daily.    Yes Historical Provider, MD  cyanocobalamin (,VITAMIN B-12,) 1000 MCG/ML injection Inject 1,000 mcg into the muscle every 30 (thirty) days.   Yes Historical Provider, MD  dexlansoprazole (DEXILANT) 60 MG capsule Take 60 mg by mouth daily.  Yes Historical Provider, MD  docusate sodium (COLACE) 100 MG capsule Take 100 mg by mouth 2 (two) times daily.   Yes Historical Provider, MD  enoxaparin (LOVENOX) 60 MG/0.6ML injection Inject 60 mg into the skin at bedtime.    Yes Historical Provider, MD  ferrous sulfate 325 (65 FE) MG tablet Take 325 mg by mouth daily with breakfast.   Yes Historical Provider, MD  levETIRAcetam (KEPPRA) 750 MG tablet Take 750 mg by mouth 2 (two) times daily.   Yes Historical Provider, MD  lidocaine-prilocaine (EMLA) cream Apply 1 application topically as needed (prior to accessing port).   Yes Historical Provider, MD  LORazepam (ATIVAN) 1 MG tablet Take 1 mg by mouth every 4 (four) hours as needed for anxiety.    Yes Historical Provider, MD  magnesium oxide (MAGNESIUM-OXIDE) 400 (241.3 Mg)  MG tablet Take 400 mg by mouth 2 (two) times daily.   Yes Historical Provider, MD  Multiple Vitamin (MULTIVITAMIN WITH MINERALS) TABS tablet Take 1 tablet by mouth daily.   Yes Historical Provider, MD  Nutritional Supplements (ENSURE PLUS HN) LIQD Take 1 Bottle by mouth every other day.   Yes Historical Provider, MD  ondansetron (ZOFRAN) 4 MG tablet Take 1 tablet (4 mg total) by mouth every 8 (eight) hours as needed for nausea. 02/22/14  Yes Aviva Signs, MD  polyethylene glycol [facility] / GLYCOLAX) packet Take 17 g by mouth daily as needed for mild constipation or moderate constipation.   Yes Historical Provider, MD  prochlorperazine (COMPAZINE) 10 MG tablet Take 10 mg by mouth every 6 (six) hours as needed for nausea or vomiting.    Yes Historical Provider, MD  promethazine (PHENERGAN) 25 MG tablet Take 1 tablet (25 mg total) by mouth every 6 (six) hours as needed for nausea. 07/07/15  Yes Davonna Belling, MD  sertraline (ZOLOFT) 100 MG tablet Take 150 mg by mouth at bedtime.    Yes Historical Provider, MD  tamsulosin (FLOMAX) 0.4 MG CAPS capsule Take 0.4 mg by mouth at bedtime.    Yes Historical Provider, MD  thiamine 100 MG tablet Take 100 mg by mouth daily.    Yes Historical Provider, MD  valsartan-hydrochlorothiazide (DIOVAN-HCT) 80-12.5 MG per tablet Take 1 tablet by mouth daily.    Yes Historical Provider, MD  Vitamin D, Ergocalciferol, (DRISDOL) 50000 units CAPS capsule Take 50,000 Units by mouth every Sunday.   Yes Historical Provider, MD  zoledronic acid (RECLAST) 5 MG/100ML SOLN injection Inject 5 mg into the vein See admin instructions. Pt gets a dose yearly.   Yes Historical Provider, MD    Family History Family History  Problem Relation Age of Onset  . Hypertension Mother   . Arthritis Mother   . Pneumonia Mother   . Hypertension Father   . Heart attack Father     Social History Social History  Substance Use Topics  . Smoking status: Former Smoker    Packs/day: 0.50     Years: 40.00    Types: Cigarettes    Quit date: 12/20/2012  . Smokeless tobacco: Never Used  . Alcohol use No     Allergies   Patient has no known allergies.   Review of Systems Review of Systems  All other systems reviewed and are negative.    Physical Exam Updated Vital Signs BP (!) 88/54   Pulse 83   Temp 98 F (36.7 C) (Oral)   Resp 20   Ht '5\' 9"'$  (1.753 m)   Wt 155 lb (70.3 kg)  SpO2 95%   BMI 22.89 kg/m   Physical Exam  Constitutional: He is oriented to person, place, and time. He appears well-developed and well-nourished.  Frail appearing, no acute distress  HENT:  Head: Normocephalic and atraumatic.  Eyes: Conjunctivae are normal.  Neck: Neck supple.  Cardiovascular: Normal rate and regular rhythm.   Pulmonary/Chest: Effort normal and breath sounds normal.  Abdominal: Soft. Bowel sounds are normal.  Musculoskeletal: Normal range of motion.  Neurological: He is alert and oriented to person, place, and time.  Skin: Skin is warm and dry.  Psychiatric: He has a normal mood and affect. His behavior is normal.  Nursing note and vitals reviewed.    ED Treatments / Results  Labs (all labs ordered are listed, but only abnormal results are displayed) Labs Reviewed  CBC WITH DIFFERENTIAL/PLATELET - Abnormal; Notable for the following:       Result Value   RBC 4.05 (*)    Hemoglobin 10.7 (*)    HCT 33.3 (*)    Monocytes Absolute 1.1 (*)    All other components within normal limits  COMPREHENSIVE METABOLIC PANEL - Abnormal; Notable for the following:    BUN 22 (*)    Creatinine, Ser 4.98 (*)    Calcium 7.5 (*)    Total Protein 6.1 (*)    GFR calc non Af Amer 11 (*)    GFR calc Af Amer 13 (*)    All other components within normal limits  CBG MONITORING, ED - Abnormal; Notable for the following:    Glucose-Capillary 120 (*)    All other components within normal limits  D-DIMER, QUANTITATIVE (NOT AT [facility])  URINALYSIS, ROUTINE W REFLEX MICROSCOPIC    TROPONIN I  CBG MONITORING, ED    EKG  EKG Interpretation None       Radiology Dg Chest 2 View  Result Date: 03/19/2016 CLINICAL DATA:  Shortness of breath for 2-3 days, some dizziness, former smoking history EXAM: CHEST  2 VIEW COMPARISON:  Chest x-ray of 03/04/2016 and CT chest of the same date FINDINGS: No active infiltrate or effusion is seen. Mediastinal and hilar contours are unremarkable. The heart is within normal limits in size. A left-sided Port-A-Cath is present with the tip seen to the mid SVC. Partial compression deformity of T8 vertebral body is again noted. IMPRESSION: No active lung disease.  Port-A-Cath tip overlies the mid SVC. Electronically Signed   By: Ivar Drape M.D.   On: 03/19/2016 12:47    Procedures Procedures (including critical care time)  Medications Ordered in ED Medications - No data to display   Initial Impression / Assessment and Plan / ED Course  I have reviewed the triage vital signs and the nursing notes.  Pertinent labs & imaging results that were available during my care of the patient were reviewed by me and considered in my medical decision making (see chart for details).  Clinical Course     Patient is weak, dizzy, dyspneic. Creatinine is 4.98 and Hgb 10.7.  Will consult nephrology and admit to general medicine.  Final Clinical Impressions(s) / ED Diagnoses   Final diagnoses:  Acute renal failure, unspecified acute renal failure type ([place])  Weakness  Anemia, unspecified type    New Prescriptions New Prescriptions   No medications on file     Nat Christen, MD 03/19/16 [place], MD 03/19/16 [place], MD 03/19/16 [phone]

## 2016-03-19 NOTE — Consult Note (Signed)
[name] MRN: [id number] DOB/AGE: 65-Apr-1952 65 y.o. Primary Care Physician:CAPLAN,Nyan, MD Admit date: 03/19/2016 Chief Complaint:  Chief Complaint  Patient presents with  . Shortness of Breath   HPI: Pt is 65 year old male with past medical hx of Colon ca [name]/p resection and chemotherapy came to ER with c/o Dyspnea.  HPI dates back to two -three days when pt started feeling weak.  Pt describes the weakness as generalized and associated with dizziness Pt also started having shortness of breath and so he came to ER. Upon evaluation in ER pt was found to have renal failure and nephrology was consulted. Pt seen in ER. Pt c/o having diarrhea since 2-3 days. Pt says he was taking his anti HTN meds though he was willing dizzy. Pt gives hx of shortness of breath started few months back and has been worsening slowly overtime. NO c/o chest pain NO c/o fever/cough/chills NO c/o abdominal pain/bloating-pt says I do get small bowel issues but this time I has no such issues.      Past Medical History:  Diagnosis Date  . 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 Anemia   . Anxiety   . Arthritis   . Barrett'[name] esophagus   . Blood transfusion   . Bowel obstruction 05/13/2012   Recurrent  . Bronchitis   . Chest pain at rest   . Chronic abdominal pain   . Erosive esophagitis   . ETOH abuse    quit 03/2010  . GERD (gastroesophageal reflux disease)   . Hx of Clostridium difficile infection 01/2012  . Hypertension   . Ileus ([place])   . Obstruction of bowel 03/03/14  . Osteoporosis   . Personal history of PE (pulmonary embolism) 10/01/2010  . Pneumonia   . Pulmonary embolism ([place]) 02/2010  . Recurrent upper respiratory infection (URI)   . [name]/P endoscopy September 28, 2010   erosive reflux esophagitis, Billroth I anatomy  . [name]/P partial gastrectomy 1980s  . Seizures ([place])   . Shortness of breath   . Sleep apnea   . TIA (transient ischemic attack) 10/11  .  Vitamin B12 deficiency         Family History  Problem Relation Age of Onset  . Hypertension Mother   . Arthritis Mother   . Pneumonia Mother   . Hypertension Father   . Heart attack Father   No hx of ESRD  Social History:  reports that he quit smoking about 3 years ago. His smoking use included Cigarettes. He has a 20.00 pack-year smoking history. He has never used smokeless tobacco. He reports that he does not drink alcohol or use drugs.   Allergies: No Known Allergies   (Not in a hospital admission)    ZDG:LOVFI from the symptoms mentioned above,there are no other symptoms referable to all systems reviewed.  Physical Exam: Vital signs in last 24 hours: Temp:  [98 F (36.7 C)] 98 F (36.7 C) (12/29 1208) Pulse Rate:  [83-106] 83 (12/29 1430) Resp:  [18-20] 20 (12/29 1430) BP: (88-119)/(54-90) 88/54 (12/29 1430) SpO2:  [95 %-100 %] 95 % (12/29 1430) Weight:  [155 lb (70.3 kg)] 155 lb (70.3 kg) (12/29 1208) Weight change:     Intake/Output from previous day: No intake/output data recorded. No intake/output data recorded.   Physical Exam: General- pt is awake,alert, oriented to time place and person Resp- No acute REsp distress, CTA B/L NO Rhonchi CVS- S1S2 regular in rate and rhythm  GIT- BS+, soft, NT, ND EXT- NO LE Edema, Cyanosis CNS- CN 2-12 grossly intact. Moving all 4 extremities Psych- normal mood and affect    Lab Results: CBC  Recent Labs  03/19/16 1133 03/19/16 1323  WBC 9.8 8.2  HGB 11.9* 10.7*  HCT 37.9* 33.3*  PLT 269 227    BMET  Recent Labs  03/19/16 1133 03/19/16 1323  NA 135 135  K 3.3* 3.7  CL 102 103  CO2 23 23  GLUCOSE 115* 81  BUN 22* 22*  CREATININE 4.96* 4.98*  CALCIUM 7.9* 7.5*   Creat trend 2017  1.3==>4.98 2016   1.3    Lab Results  Component Value Date   CALCIUM 7.5 (L) 03/19/2016   CAION 1.18 03/03/2011   PHOS 2.9 09/18/2013      Impression: 1)Renal  AKI secondary to Prerenal/ATN                 AKI sec to Hypotension/ HCTZ/ACE                AKI on CKD                CKD sec to HTN                CKD since  2016                               2)CVS- hemodynamically fragile           BP on lower side   3)Anemia HGb low 10/28/15      03/19/16 15.9   ===> 10.7  4)Hypocalcemia          Vitamin d within normal range when checked in August          On replacement as outpt                    5)Oncology-hx of colon ca [name]/p Sx and Chemo  Primary MD following  6)Electrolytes  hypokalemic   Now better  NOrmonatremic   7)Acid base Co2 at goal     Plan:  Will ask for renal u/[name] ( if not getting ct scan) Will ask for FENA Will ask for anemia profile Will suggest to recheck CEA levls and CT scan thereafter if anemia profile and CEA levels suggets  Will suggest IVF at 145m/hr Will suggest 2d echo to see EF     [name] 03/19/2016, 3:21 PM

## 2016-03-19 NOTE — H&P (Signed)
History and Physical    [name] GDJ:[id number] DOB: 1950/03/24 DOA: 03/19/2016  PCP: [name], MD   Patient coming from: Home  Chief Complaint: Dyspnea, weakness, lightheadedness   HPI: [name] is a 65 y.o. male with medical history significant for hypertension, GERD, colon cancer in remission, history of PE on therapeutic Lovenox, anxiety, and depression who presents to emergency department with progressive dyspnea, generalized weakness, and lightheadedness. Patient reports the insidious development of generalized weakness, lightheadedness, and dyspnea over the past month, worsening in recent days. He was evaluated by his primary care physician for these complaints, found to have low iron, and referred to hematology for iron infusion. He had blood work performed today for his outpatient doctor and was noted to have a new renal failure. He does report poor appetite and some loose stools for the past couple days, but denies any fevers, chills, chest pain, palpitations, cough, or swelling. He has not started any new medications recently and does not use NSAIDs. He denies abdominal or flank pain and denies nausea or vomiting.  ED Course: Upon arrival to the ED, patient is found to be afebrile, saturating well on room air, with blood pressure of 88/54, and vitals otherwise stable. EKG features sinus tachycardia with rate 104 and flattening of the T waves in inferolateral leads. Chest x-ray is negative for acute cardiopulmonary disease. Chemistry panel features a BUN of 22 and serum creatinine of 4.98, up from 1.3 in August of this year. CBC is notable for a hemoglobin of 10.7, in the 15 range in August 2017. D-dimer is negative and troponin is undetectable. Nephrology was consulted by the ED physician and a medical admission was advised for ongoing evaluation and management of acute renal failure and anemia.  Review of Systems:  All other systems reviewed and apart from HPI, are  negative.  Past Medical History:  Diagnosis Date  . 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 Anemia   . Anxiety   . Arthritis   . Barrett's esophagus   . Blood transfusion   . Bowel obstruction 05/13/2012   Recurrent  . Bronchitis   . Chest pain at rest   . Chronic abdominal pain   . Erosive esophagitis   . ETOH abuse    quit 03/2010  . GERD (gastroesophageal reflux disease)   . Hx of Clostridium difficile infection 01/2012  . Hypertension   . Ileus ([place])   . Obstruction of bowel 03/03/14  . Osteoporosis   . Personal history of PE (pulmonary embolism) 10/01/2010  . Pneumonia   . Pulmonary embolism ([place]) 02/2010  . Recurrent upper respiratory infection (URI)   . S/P endoscopy September 28, 2010   erosive reflux esophagitis, Billroth I anatomy  . 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 Seizures ([place])   . Shortness of breath   . Sleep apnea   . TIA (transient ischemic attack) 10/11  . Vitamin B12 deficiency     Past Surgical History:  Procedure Laterality Date  . ABDOMINAL ADHESION SURGERY  03/04/15   @ [facility]  . ABDOMINAL EXPLORATION SURGERY    . abdominal sugery     for bowel obstruction x 8, all in 1980s, except for one in 07/2010  . APPENDECTOMY  1980s  . Billroth 1 hemigastrectomy  1980s   per patient for benign duodenal tumor  . CARDIAC CATHETERIZATION  07/17/2012  . CHOLECYSTECTOMY  1980s  . COLON SURGERY  May 2012   left hemicolectomy,  colon cancer found at time of surgery for bowel obstruction  . COLONOSCOPY  03/18/2011   anastomosis at 35cm. Several adenomatous polyps removed. Sigmoid diverticulosis. Next TCS 02/2013  . COLONOSCOPY N/A 07/24/2012   DQQ:IWLNLG post segmental resection with normal-appearing colonic anastomosis aside from an adjacent polyp-removed as described above. Rectal polyp-removed as described above. CT findings appear to have been artifactual. tubular adenomas/prolapsed type polyp.  . COLONOSCOPY N/A 05/15/2015   Procedure:  COLONOSCOPY;  Surgeon: [name], MD;  [facility];  Service: Endoscopy;  Laterality: N/A;  . ESOPHAGOGASTRODUODENOSCOPY  09/28/2010  . ESOPHAGOGASTRODUODENOSCOPY  12/01/2010   Cervical web status post dilation, erosive esophagitis, B1 hemigastrectomy, inflamed anastomosis  . ESOPHAGOGASTRODUODENOSCOPY  04/16/2011   excoriation at GEJ c/w trauma/M-W tear, friable gastric anastomosis, dilation efferent limb  . ESOPHAGOGASTRODUODENOSCOPY N/A 06/03/2014   Dr.[name]- cervcal esopphageal web s/p dilation. abnormal distal esophagus bx= barretts esophagus  . ESOPHAGOGASTRODUODENOSCOPY N/A 05/15/2015   Procedure: ESOPHAGOGASTRODUODENOSCOPY (EGD);  Surgeon: [name], MD;  [facility];  Service: Endoscopy;  Laterality: N/A;  230  . ESOPHAGOGASTRODUODENOSCOPY (EGD) WITH ESOPHAGEAL DILATION  02/25/2012   XQJ:JHERDEYC esophageal web-s/p dilation anddisruption as described above. Status post prior gastric with Billroth I configuration. Abnormal gastric mucosa at the anastomosis. Gastric biopsy showed mild chronic inflammation but no H. pylori   . HERNIA REPAIR     right inguinal  . MALONEY DILATION N/A 06/03/2014   Procedure: [name] DILATION;  Surgeon: [name], MD;  [facility];  Service: Endoscopy;  Laterality: N/A;  [name] DILATION N/A 05/15/2015   Procedure: [name] DILATION;  Surgeon: [name], MD;  [facility];  Service: Endoscopy;  Laterality: N/A;  . PORTACATH PLACEMENT    . SAVORY DILATION N/A 06/03/2014   Procedure: SAVORY DILATION;  Surgeon: [name], MD;  [facility];  Service: Endoscopy;  Laterality: N/A;     reports that he quit smoking about 3 years ago. His smoking use included Cigarettes. He has a 20.00 pack-year smoking history. He has never used smokeless tobacco. He reports that he does not drink alcohol or use drugs.  No Known Allergies  Family History  Problem Relation Age of Onset  . Hypertension  Mother   . Arthritis Mother   . Pneumonia Mother   . Hypertension Father   . Heart attack Father      Prior to Admission medications   Medication Sig Start Date End Date Taking? Authorizing Provider  acetaminophen (TYLENOL) 500 MG tablet Take 1,000 mg by mouth every 6 (six) hours as needed for mild pain, moderate pain or headache.   Yes Historical Provider, MD  acidophilus (RISAQUAD) CAPS capsule Take 1 capsule by mouth daily.   Yes Historical Provider, MD  albuterol (PROAIR HFA) 108 (90 BASE) MCG/ACT inhaler Inhale 1-2 puffs into the lungs every 6 (six) hours as needed for wheezing or shortness of breath.    Yes Historical Provider, MD  atorvastatin (LIPITOR) 20 MG tablet Take 20 mg by mouth at bedtime.    Yes Historical Provider, MD  cholecalciferol (VITAMIN D) 1000 UNITS tablet Take 1,000 Units by mouth daily.    Yes Historical Provider, MD  cyanocobalamin (,VITAMIN B-12,) 1000 MCG/ML injection Inject 1,000 mcg into the muscle every 30 (thirty) days.   Yes Historical Provider, MD  dexlansoprazole (DEXILANT) 60 MG capsule Take 60 mg by mouth daily.   Yes Historical Provider, MD  docusate sodium (COLACE) 100 MG capsule Take  100 mg by mouth 2 (two) times daily.   Yes Historical Provider, MD  enoxaparin (LOVENOX) 60 MG/0.6ML injection Inject 60 mg into the skin at bedtime.    Yes Historical Provider, MD  ferrous sulfate 325 (65 FE) MG tablet Take 325 mg by mouth daily with breakfast.   Yes Historical Provider, MD  levETIRAcetam (KEPPRA) 750 MG tablet Take 750 mg by mouth 2 (two) times daily.   Yes Historical Provider, MD  lidocaine-prilocaine (EMLA) cream Apply 1 application topically as needed (prior to accessing port).   Yes Historical Provider, MD  LORazepam (ATIVAN) 1 MG tablet Take 1 mg by mouth every 4 (four) hours as needed for anxiety.    Yes Historical Provider, MD  magnesium oxide (MAGNESIUM-OXIDE) 400 (241.3 Mg) MG tablet Take 400 mg by mouth 2 (two) times daily.   Yes Historical  Provider, MD  Multiple Vitamin (MULTIVITAMIN WITH MINERALS) TABS tablet Take 1 tablet by mouth daily.   Yes Historical Provider, MD  Nutritional Supplements (ENSURE PLUS HN) LIQD Take 1 Bottle by mouth every other day.   Yes Historical Provider, MD  ondansetron (ZOFRAN) 4 MG tablet Take 1 tablet (4 mg total) by mouth every 8 (eight) hours as needed for nausea. 02/22/14  Yes Aviva Signs, MD  polyethylene glycol [facility] / GLYCOLAX) packet Take 17 g by mouth daily as needed for mild constipation or moderate constipation.   Yes Historical Provider, MD  prochlorperazine (COMPAZINE) 10 MG tablet Take 10 mg by mouth every 6 (six) hours as needed for nausea or vomiting.    Yes Historical Provider, MD  promethazine (PHENERGAN) 25 MG tablet Take 1 tablet (25 mg total) by mouth every 6 (six) hours as needed for nausea. 07/07/15  Yes Davonna Belling, MD  sertraline (ZOLOFT) 100 MG tablet Take 150 mg by mouth at bedtime.    Yes Historical Provider, MD  tamsulosin (FLOMAX) 0.4 MG CAPS capsule Take 0.4 mg by mouth at bedtime.    Yes Historical Provider, MD  thiamine 100 MG tablet Take 100 mg by mouth daily.    Yes Historical Provider, MD  valsartan-hydrochlorothiazide (DIOVAN-HCT) 80-12.5 MG per tablet Take 1 tablet by mouth daily.    Yes Historical Provider, MD  Vitamin D, Ergocalciferol, (DRISDOL) 50000 units CAPS capsule Take 50,000 Units by mouth every Sunday.   Yes Historical Provider, MD  zoledronic acid (RECLAST) 5 MG/100ML SOLN injection Inject 5 mg into the vein See admin instructions. Pt gets a dose yearly.   Yes Historical Provider, MD    Physical Exam: Vitals:   03/19/16 1530 03/19/16 1600 03/19/16 1630 03/19/16 1743  BP: 105/77 111/81 109/69 102/65  Pulse: 80 79 78 (!) 103  Resp: '15 12 17 18  '$ Temp:    98.3 F (36.8 C)  TempSrc:    Oral  SpO2: 100% 98% 99% 100%  Weight:    71.9 kg (158 lb 9.6 oz)  Height:    '5\' 9"'$  (1.753 m)      Constitutional: NAD, calm, comfortable, chronically-ill  appearing Eyes: PERTLA, lids and conjunctivae normal ENMT: Mucous membranes are dry. Posterior pharynx clear of any exudate or lesions.   Neck: normal, supple, no masses, no thyromegaly Respiratory: clear to auscultation bilaterally, no wheezing, no crackles. Normal respiratory effort.   Cardiovascular: S1 & S2 heard, regular rate and rhythm. No extremity edema. No significant JVD. Abdomen: No distension, no tenderness, no masses palpated. Bowel sounds normal.  Musculoskeletal: no clubbing / cyanosis. No joint deformity upper and lower extremities. Normal  muscle tone.  Skin: no significant rashes, lesions, ulcers. Warm, dry, well-perfused. Neurologic: CN 2-12 grossly intact. Sensation intact, DTR normal. Strength 5/5 in all 4 limbs.  Psychiatric: Normal judgment and insight. Alert and oriented x 3. Normal mood and affect.     Labs on Admission: I have personally reviewed following labs and imaging studies  CBC:  Recent Labs Lab 03/19/16 1133 03/19/16 1323  WBC 9.8 8.2  NEUTROABS 6.6 5.4  HGB 11.9* 10.7*  HCT 37.9* 33.3*  MCV 83.1 82.2  PLT 269 144   Basic Metabolic Panel:  Recent Labs Lab 03/19/16 1133 03/19/16 1323  NA 135 135  K 3.3* 3.7  CL 102 103  CO2 23 23  GLUCOSE 115* 81  BUN 22* 22*  CREATININE 4.96* 4.98*  CALCIUM 7.9* 7.5*   GFR: Estimated Creatinine Clearance: 14.8 mL/min (by C-G formula based on SCr of 4.98 mg/dL (H)). Liver Function Tests:  Recent Labs Lab 03/19/16 1323  AST 23  ALT 29  ALKPHOS 110  BILITOT 0.4  PROT 6.1*  ALBUMIN 3.6   No results for input(s): LIPASE, AMYLASE in the last 168 hours. No results for input(s): AMMONIA in the last 168 hours. Coagulation Profile: No results for input(s): INR, PROTIME in the last 168 hours. Cardiac Enzymes:  Recent Labs Lab 03/19/16 1323  TROPONINI <0.03   BNP (last 3 results) No results for input(s): PROBNP in the last 8760 hours. HbA1C: No results for input(s): HGBA1C in the last 72  hours. CBG:  Recent Labs Lab 03/19/16 1214  GLUCAP 120*   Lipid Profile: No results for input(s): CHOL, HDL, LDLCALC, TRIG, CHOLHDL, LDLDIRECT in the last 72 hours. Thyroid Function Tests: No results for input(s): TSH, T4TOTAL, FREET4, T3FREE, THYROIDAB in the last 72 hours. Anemia Panel:  Recent Labs  03/19/16 1133 03/19/16 1134 03/19/16 1540  VITAMINB12  --  386  --   FOLATE  --  87.0  --   FERRITIN  --  10*  --   TIBC  --  479*  --   IRON  --  31*  --   RETICCTPCT 1.3  --  1.4   Urine analysis:    Component Value Date/Time   COLORURINE YELLOW 11/30/2014 1115   APPEARANCEUR CLEAR 11/30/2014 1115   LABSPEC 1.020 11/30/2014 1115   PHURINE 6.0 11/30/2014 1115   GLUCOSEU NEGATIVE 11/30/2014 1115   HGBUR NEGATIVE 11/30/2014 1115   BILIRUBINUR NEGATIVE 11/30/2014 1115   KETONESUR NEGATIVE 11/30/2014 1115   PROTEINUR NEGATIVE 11/30/2014 1115   UROBILINOGEN 0.2 11/30/2014 1115   NITRITE NEGATIVE 11/30/2014 1115   LEUKOCYTESUR NEGATIVE 11/30/2014 1115   Sepsis Labs: '@LABRCNTIP'$ (procalcitonin:4,lacticidven:4) )No results found for this or any previous visit (from the past 240 hour(s)).   Radiological Exams on Admission: Ct Abdomen Pelvis Wo Contrast  Result Date: 03/19/2016 CLINICAL DATA:  SOB, bilateral shoulder pain, hx of colon cancer stage III treatment surgery and chemo.Today acute renal failure, anemia EXAM: CT ABDOMEN AND PELVIS WITHOUT CONTRAST TECHNIQUE: Multidetector CT imaging of the abdomen and pelvis was performed following the standard protocol without IV contrast. COMPARISON:  02/20/2015 FINDINGS: Lower chest: No acute abnormality. Hepatobiliary: No focal liver abnormality is seen. Status post cholecystectomy. No biliary dilatation. Pancreas: Unremarkable. No pancreatic ductal dilatation or surrounding inflammatory changes. Spleen: Normal in size without focal abnormality. Adrenals/Urinary Tract: No adrenal masses. Bilateral renal cortical thinning. Low-density  right renal masses, largest arising from the midpole measuring 4.8 cm, relatively stable from the prior study and consistent with cysts.  There are bilateral nonobstructing intrarenal stones. No hydronephrosis. Ureters normal course and in caliber. Bladder is unremarkable. Stomach/Bowel: There has been previous stomach surgery with formation of a gastrojejunostomy. 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 There has also been a colon resection with formation of an anastomosis in the lower pelvis. Remaining colon in the anterior pelvis low abdomen is dilated to a maximum of 10 cm. There is no bowel wall thickening. No convincing obstruction. No mesenteric inflammation. Vascular/Lymphatic: No enlarged lymph nodes. Mild scattered atherosclerotic calcifications noted along a normal caliber abdominal aorta. Reproductive: Mild enlargement of prostate gland. Other: No abdominal wall hernia.  No ascites. Musculoskeletal: No acute findings. No osteoblastic or osteolytic lesions. IMPRESSION: 1. No acute findings within the abdomen or pelvis. 2. The residual colon in the low abdomen and pelvis is dilated to 10 cm, which is similar to the prior CT. There is no bowel wall thickening or inflammation. 3. There changes from a partial gastric resection, formation of a gastrojejunostomy and colon resection with a pelvic anastomosis, all stable from the prior study. 4. Mild bilateral renal cortical thinning. Right renal cysts. Bilateral renal calcifications, but no ureteral stone or obstructive uropathy. Electronically Signed   By: Lajean Manes M.D.   On: 03/19/2016 16:14   Dg Chest 2 View  Result Date: 03/19/2016 CLINICAL DATA:  Shortness of breath for 2-3 days, some dizziness, former smoking history EXAM: CHEST  2 VIEW COMPARISON:  Chest x-ray of 03/04/2016 and CT chest of the same date FINDINGS: No active infiltrate or effusion is seen. Mediastinal and  hilar contours are unremarkable. The heart is within normal limits in size. A left-sided Port-A-Cath is present with the tip seen to the mid SVC. Partial compression deformity of T8 vertebral body is again noted. IMPRESSION: No active lung disease.  Port-A-Cath tip overlies the mid SVC. Electronically Signed   By: Ivar Drape M.D.   On: 03/19/2016 12:47    EKG: Independently reviewed. Sinus tachycardia (rate 104), T-wave flattening inferolateral leads   Assessment/Plan  1. Acute kidney injury  - SCr is 4.98 on admission, up from 1.3 in August 2017  - Likely a prerenal azotemia in setting of hypotension and recent diarrhea, ATN possible; likely exacerbated by valsartan-HCTZ  - Nephrology is consulting and much appreciated  - Plan to check urine studies and kidney imaging, provide IVF hydration, and repeat chem panel in am  - Medications will renally-dosed as applicable, nephrotoxins to be avoided where possible, hold valsartan-HCTZ    2. Normocytic anemia  - Hgb 10.7 on admission, down from 15 in August 2017  - Pt denies melena or hematochezia; FOBT pending  - With hx of colon cancer, will obtain abd/pel CT to image kidneys as above so that colon can be evaluated    3. Hypotension, hx of HTN - BP has remained soft but stable and pt asymptomatic  - He is treated with valsartan-HCTZ at home and took it the morning of admission; this is held - Continue IVF hydration, give small boluses prn  - Check TTE, ordered   4. Hx of colorectal cancer  - Stage III, diagnosed in May 2012 and s/p FOLFOX in 2012  - EGD and colonoscopy reportedly normal in February 2017  - Given the new anemia and fatigue, will check CEA and CT abd/pel as above  - Ongoing management per oncology    5. Hx of PE  - Pt developed  a PE in November 2011 and December 2012  - He has been managed with Lovenox injections - There is no evidence for acute VTE - Continue Lovenox, renally-dosed    6. GERD - EGD in February 2017  with Barrett esophagus and esophageal nodule (path report: Inflammatory, no dysplasia or malignancy)  - He is treated with Dexilant at home and will be continue on scheduled PPI with Protonix while in hospital     DVT prophylaxis: Continue treatment-dose Lovenox  Code Status: Full  Family Communication: Discussed with patient Disposition Plan: Admit to med-surg Consults called: Nephrology Admission status: Inpatient    [name] Bulls, MD [facility] Pager [phone]  If 7PM-7AM, please contact night-coverage [email or web address] Password [facility]  03/19/2016, 6:09 PM

## 2016-03-19 NOTE — ED Notes (Signed)
Pt reports had blood drawn at 1130 this morning due to low iron.

## 2016-03-20 ENCOUNTER — Inpatient Hospital Stay (HOSPITAL_COMMUNITY): Payer: Medicare Other

## 2016-03-20 DIAGNOSIS — N170 Acute kidney failure with tubular necrosis: Principal | ICD-10-CM

## 2016-03-20 DIAGNOSIS — R0600 Dyspnea, unspecified: Secondary | ICD-10-CM

## 2016-03-20 LAB — RETICULOCYTES
RBC.: 3.61 MIL/uL — AB (ref 4.22–5.81)
RETIC COUNT ABSOLUTE: 39.7 10*3/uL (ref 19.0–186.0)
RETIC CT PCT: 1.1 % (ref 0.4–3.1)

## 2016-03-20 LAB — TYPE AND SCREEN
ABO/RH(D): O POS
Antibody Screen: NEGATIVE

## 2016-03-20 LAB — CEA: CEA: 3.3 ng/mL (ref 0.0–4.7)

## 2016-03-20 LAB — FOLATE: Folate: 61.5 ng/mL (ref >5.9)

## 2016-03-20 LAB — BASIC METABOLIC PANEL
ANION GAP: 7 (ref 5–15)
BUN: 21 mg/dL — ABNORMAL HIGH (ref 6–20)
CALCIUM: 6.5 mg/dL — AB (ref 8.9–10.3)
CO2: 23 mmol/L (ref 22–32)
CREATININE: 3.86 mg/dL — AB (ref 0.61–1.24)
Chloride: 105 mmol/L (ref 101–111)
GFR, EST AFRICAN AMERICAN: 17 mL/min — AB (ref >60)
GFR, EST NON AFRICAN AMERICAN: 15 mL/min — AB (ref >60)
Glucose, Bld: 92 mg/dL (ref 65–99)
Potassium: 3.5 mmol/L (ref 3.5–5.1)
SODIUM: 135 mmol/L (ref 135–145)

## 2016-03-20 LAB — IRON AND TIBC
IRON: 75 ug/dL (ref 45–182)
Saturation Ratios: 20 % (ref 17.9–39.5)
TIBC: 377 ug/dL (ref 250–450)
UIBC: 302 ug/dL

## 2016-03-20 LAB — ECHOCARDIOGRAM COMPLETE
HEIGHTINCHES: 69 in
WEIGHTICAEL: 2488 [oz_av]

## 2016-03-20 LAB — VITAMIN B12: Vitamin B-12: 282 pg/mL (ref 180–914)

## 2016-03-20 LAB — ERYTHROPOIETIN: Erythropoietin: 12 m[IU]/mL (ref 2.6–18.5)

## 2016-03-20 LAB — HAPTOGLOBIN: Haptoglobin: 330 mg/dL — ABNORMAL HIGH (ref 34–200)

## 2016-03-20 LAB — FERRITIN: FERRITIN: 8 ng/mL — AB (ref 24–336)

## 2016-03-20 MED ORDER — CALCIUM GLUCONATE 10 % IV SOLN
1.0000 g | Freq: Once | INTRAVENOUS | Status: AC
  Administered 2016-03-20: 1 g via INTRAVENOUS
  Filled 2016-03-20: qty 10

## 2016-03-20 MED ORDER — CALCIUM CARBONATE 1250 (500 CA) MG PO TABS
1.0000 | ORAL_TABLET | Freq: Every day | ORAL | Status: DC
  Administered 2016-03-21: 500 mg via ORAL
  Filled 2016-03-20: qty 1

## 2016-03-20 MED ORDER — SODIUM CHLORIDE 0.9 % IV SOLN
INTRAVENOUS | Status: AC
  Administered 2016-03-20 – 2016-03-21 (×2): via INTRAVENOUS

## 2016-03-20 NOTE — Progress Notes (Signed)
[name]  MRN: [id number]  DOB/AGE: 65-16-1952 65 y.o.  Primary Care Physician:CAPLAN,Jesiah, MD  Admit date: 03/19/2016  Chief Complaint:  Chief Complaint  Patient presents with  . Shortness of Breath    S-Pt presented on  03/19/2016 with  Chief Complaint  Patient presents with  . Shortness of Breath  .    Pt today feels better  Meds  . acidophilus  1 capsule Oral Daily  . atorvastatin  20 mg Oral QHS  . cholecalciferol  1,000 Units Oral Daily  . docusate sodium  100 mg Oral BID  . enoxaparin  60 mg Subcutaneous QHS  . ferrous sulfate  325 mg Oral Q breakfast  . levETIRAcetam  250 mg Oral BID  . magnesium oxide  400 mg Oral BID  . multivitamin with minerals  1 tablet Oral Daily  . pantoprazole  80 mg Oral Daily  . sertraline  150 mg Oral QHS  . tamsulosin  0.4 mg Oral QHS  . thiamine  100 mg Oral Daily        Physical Exam: Vital signs in last 24 hours: Temp:  [98 F (36.7 C)-98.3 F (36.8 C)] 98 F (36.7 C) (12/30 0530) Pulse Rate:  [74-103] 83 (12/30 0530) Resp:  [12-20] 18 (12/30 0530) BP: (85-111)/(54-81) 111/67 (12/30 0530) SpO2:  [95 %-100 %] 97 % (12/30 0530) Weight:  [155 lb 8 oz (70.5 kg)-158 lb 9.6 oz (71.9 kg)] 155 lb 8 oz (70.5 kg) (12/30 0530) Weight change:  Last BM Date: 03/19/16  Intake/Output from previous day: 12/29 0701 - 12/30 0700 In: 1226.7 [I.V.:1226.7] Out: 200 [Urine:200] Total I/O In: 305 [P.O.:240; I.V.:65] Out: -    Physical Exam: General- pt is awake,alert, oriented to time place and person Resp- No acute REsp distress, CTA B/L NO Rhonchi CVS- S1S2 regular ij rate and rhythm GIT- BS+, soft, NT, ND EXT- NO LE Edema, Cyanosis   Lab Results: CBC  Recent Labs  03/19/16 1133 03/19/16 1323  WBC 9.8 8.2  HGB 11.9* 10.7*  HCT 37.9* 33.3*  PLT 269 227    BMET  Recent Labs  03/19/16 1323 03/20/16 0644  NA 135 135  K 3.7 3.5  CL 103 105  CO2 23 23  GLUCOSE 81 92  BUN 22* 21*  CREATININE  4.98* 3.86*  CALCIUM 7.5* 6.5*   Creat trend 2017  1.3==>4.98==> 3.86 2016   1.3    MICRO No results found for this or any previous visit (from the past 240 hour(s)).    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       BHUTANI,MANPREET S 03/20/2016, 1:33 PM

## 2016-03-20 NOTE — Progress Notes (Addendum)
PROGRESS NOTE                                                                                                                                                                                                             Patient Demographics:    [name], is a 65 y.o. male, DOB - 1950/04/22, NUU:[id number]  Admit date - 03/19/2016   Admitting Physician Vianne Bulls, MD  Outpatient Primary MD for the patient is [name], MD  LOS - 1  Chief Complaint  Patient presents with  . Shortness of Breath       Brief Narrative  [name] is a 65 y.o. male with medical history significant for hypertension, GERD, colon cancer in remission, history of PE on therapeutic Lovenox, anxiety, and depression who presents to emergency department with progressive dyspnea, generalized weakness, and lightheadedness, workup showed acute renal failure.   Subjective:    [name] today has, No headache, No chest pain, No abdominal pain - No Nausea, No new weakness tingling or numbness, No Cough - SOB.     Assessment  & Plan :     1.Acute renal failure due to dehydration and hypotension caused by HCTZ in the setting of ARB with ATN. Renal ultrasound and CT abdomen pelvis nonacute, renal function improving with IV fluids which will be continued for another 24 hours. Appreciate renal input.  2. History of colon cancer. Follow outpatient with oncologist and PCP. CEA ordered upon admission is pending.  3. History of PE. Continue Lovenox.  4. GERD/Barrett's esophagus. Continue with PPI and outpatient GI follow-up.  5. Essential hypertension. Blood pressure soft. Treatment as in #1 above.  6.Anemia - An Panel + type screen.    Family Communication  :  wife  Code Status :  Full  Diet : Diet regular Room service appropriate? Yes; Fluid consistency: Thin   Disposition Plan  :  Home 2-3 days  Consults  :   Renal  Procedures  :    Renal ultrasound shows renal cysts and nonobstructing kidney stone.  Abdominal CT. Nonacute.  Echocardiogram   DVT Prophylaxis  :  Lovenox   Lab Results  Component Value Date   PLT 227 03/19/2016    Inpatient Medications  Scheduled Meds: . acidophilus  1 capsule Oral Daily  . atorvastatin  20 mg Oral  QHS  . cholecalciferol  1,000 Units Oral Daily  . docusate sodium  100 mg Oral BID  . enoxaparin  60 mg Subcutaneous QHS  . ferrous sulfate  325 mg Oral Q breakfast  . levETIRAcetam  250 mg Oral BID  . magnesium oxide  400 mg Oral BID  . multivitamin with minerals  1 tablet Oral Daily  . pantoprazole  80 mg Oral Daily  . sertraline  150 mg Oral QHS  . tamsulosin  0.4 mg Oral QHS  . thiamine  100 mg Oral Daily   Continuous Infusions: . sodium chloride 75 mL/hr at 03/20/16 0933   PRN Meds:.acetaminophen, albuterol, LORazepam, ondansetron **OR** ondansetron (ZOFRAN) IV, polyethylene glycol  Antibiotics  :    Anti-infectives    None         Objective:   Vitals:   03/19/16 1630 03/19/16 1743 03/19/16 2049 03/20/16 0530  BP: 109/69 102/65 (!) 96/59 111/67  Pulse: 78 (!) 103 74 83  Resp: '17 18 16 18  '$ Temp:  98.3 F (36.8 C) 98.1 F (36.7 C) 98 F (36.7 C)  TempSrc:  Oral Oral Oral  SpO2: 99% 100% 97% 97%  Weight:  71.9 kg (158 lb 9.6 oz)  70.5 kg (155 lb 8 oz)  Height:  '5\' 9"'$  (1.753 m)      Wt Readings from Last 3 Encounters:  03/20/16 70.5 kg (155 lb 8 oz)  10/28/15 68.1 kg (150 lb 1.6 oz)  07/07/15 70.3 kg (155 lb)     Intake/Output Summary (Last 24 hours) at 03/20/16 1254 Last data filed at 03/20/16 1025  Gross per 24 hour  Intake          1531.67 ml  Output              200 ml  Net          1331.67 ml     Physical Exam  Awake Alert, Oriented X 3, No new F.N deficits, Normal affect [place].AT,PERRAL Supple Neck,No JVD, No cervical lymphadenopathy appriciated.  Symmetrical Chest wall movement, Good air movement  bilaterally, CTAB RRR,No Gallops,Rubs or new Murmurs, No Parasternal Heave +ve B.Sounds, Abd Soft, No tenderness, No organomegaly appriciated, No rebound - guarding or rigidity. No Cyanosis, Clubbing or edema, No new Rash or bruise       Data Review:    CBC  Recent Labs Lab 03/19/16 1133 03/19/16 1323  WBC 9.8 8.2  HGB 11.9* 10.7*  HCT 37.9* 33.3*  PLT 269 227  MCV 83.1 82.2  MCH 26.1 26.4  MCHC 31.4 32.1  RDW 14.8 14.9  LYMPHSABS 2.0 1.5  MONOABS 1.0 1.1*  EOSABS 0.2 0.2  BASOSABS 0.0 0.0    Chemistries   Recent Labs Lab 03/19/16 1133 03/19/16 1323 03/20/16 0644  NA 135 135 135  K 3.3* 3.7 3.5  CL 102 103 105  CO2 '23 23 23  '$ GLUCOSE 115* 81 92  BUN 22* 22* 21*  CREATININE 4.96* 4.98* 3.86*  CALCIUM 7.9* 7.5* 6.5*  AST  --  23  --   ALT  --  29  --   ALKPHOS  --  110  --   BILITOT  --  0.4  --    ------------------------------------------------------------------------------------------------------------------ No results for input(s): CHOL, HDL, LDLCALC, TRIG, CHOLHDL, LDLDIRECT in the last 72 hours.  No results found for: HGBA1C ------------------------------------------------------------------------------------------------------------------ No results for input(s): TSH, T4TOTAL, T3FREE, THYROIDAB in the last 72 hours.  Invalid input(s): FREET3 ------------------------------------------------------------------------------------------------------------------  Recent Labs  03/19/16  1133 03/19/16 1134 03/19/16 1540  VITAMINB12  --  386  --   FOLATE  --  87.0  --   FERRITIN  --  10*  --   TIBC  --  479*  --   IRON  --  31*  --   RETICCTPCT 1.3  --  1.4    Coagulation profile No results for input(s): INR, PROTIME in the last 168 hours.   Recent Labs  03/19/16 1323  DDIMER 0.36    Cardiac Enzymes  Recent Labs Lab 03/19/16 1323  TROPONINI <0.03    ------------------------------------------------------------------------------------------------------------------ No results found for: BNP  Micro Results No results found for this or any previous visit (from the past 240 hour(s)).  Radiology Reports Ct Abdomen Pelvis Wo Contrast  Result Date: 03/19/2016 CLINICAL DATA:  SOB, bilateral shoulder pain, hx of colon cancer stage III treatment surgery and chemo.Today acute renal failure, anemia EXAM: CT ABDOMEN AND PELVIS WITHOUT CONTRAST TECHNIQUE: Multidetector CT imaging of the abdomen and pelvis was performed following the standard protocol without IV contrast. COMPARISON:  02/20/2015 FINDINGS: Lower chest: No acute abnormality. Hepatobiliary: No focal liver abnormality is seen. Status post cholecystectomy. No biliary dilatation. Pancreas: Unremarkable. No pancreatic ductal dilatation or surrounding inflammatory changes. Spleen: Normal in size without focal abnormality. Adrenals/Urinary Tract: No adrenal masses. Bilateral renal cortical thinning. Low-density right renal masses, largest arising from the midpole measuring 4.8 cm, relatively stable from the prior study and consistent with cysts. There are bilateral nonobstructing intrarenal stones. No hydronephrosis. Ureters normal course and in caliber. Bladder is unremarkable. Stomach/Bowel: There has been previous stomach surgery with formation of a gastrojejunostomy. 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 There has also been a colon resection with formation of an anastomosis in the lower pelvis. Remaining colon in the anterior pelvis low abdomen is dilated to a maximum of 10 cm. There is no bowel wall thickening. No convincing obstruction. No mesenteric inflammation. Vascular/Lymphatic: No enlarged lymph nodes. Mild scattered atherosclerotic calcifications noted along a normal caliber abdominal aorta. Reproductive: Mild  enlargement of prostate gland. Other: No abdominal wall hernia.  No ascites. Musculoskeletal: No acute findings. No osteoblastic or osteolytic lesions. IMPRESSION: 1. No acute findings within the abdomen or pelvis. 2. The residual colon in the low abdomen and pelvis is dilated to 10 cm, which is similar to the prior CT. There is no bowel wall thickening or inflammation. 3. There changes from a partial gastric resection, formation of a gastrojejunostomy and colon resection with a pelvic anastomosis, all stable from the prior study. 4. Mild bilateral renal cortical thinning. Right renal cysts. Bilateral renal calcifications, but no ureteral stone or obstructive uropathy. Electronically Signed   By: Lajean Manes M.D.   On: 03/19/2016 16:14   Dg Chest 2 View  Result Date: 03/19/2016 CLINICAL DATA:  Shortness of breath for 2-3 days, some dizziness, former smoking history EXAM: CHEST  2 VIEW COMPARISON:  Chest x-ray of 03/04/2016 and CT chest of the same date FINDINGS: No active infiltrate or effusion is seen. Mediastinal and hilar contours are unremarkable. The heart is within normal limits in size. A left-sided Port-A-Cath is present with the tip seen to the mid SVC. Partial compression deformity of T8 vertebral body is again noted. IMPRESSION: No active lung disease.  Port-A-Cath tip overlies the mid SVC. Electronically Signed   By: Ivar Drape M.D.   On: 03/19/2016 12:47   US Renal  Result Date:  03/20/2016 CLINICAL DATA:  Acute renal failure EXAM: RENAL / URINARY TRACT ULTRASOUND COMPLETE COMPARISON:  CT abdomen pelvis March 19, 2016 FINDINGS: Right Kidney: Length: 12.3 cm. There are 2 right kidney cysts. The larger is in the lower pole measuring 4.6 x 4.7 x 4.7 cm. There is a focal echogenic focus within the right kidney correlating to the nonobstructing stone noted on the CT scan in the midpole right kidney. No hydronephrosis visualized. Left Kidney: Length: 12 cm. Echogenicity within normal limits. No  mass or hydronephrosis visualized. Bladder: Appears normal for degree of bladder distention. The ureteral jets are not visualized. IMPRESSION: Right kidney cysts. Focal nonobstructing stone in the right kidney. No hydronephrosis bilaterally. Electronically Signed   By: Abelardo Diesel M.D.   On: 03/20/2016 11:38    Time Spent in minutes  30   Jakiah Bienaime K M.D on 03/20/2016 at 12:54 PM  Between 7am to 7pm - Pager - [phone]  After 7pm go to [email or web address] - password [facility]  [facility] -  Office  [phone]

## 2016-03-20 NOTE — CV Procedure (Signed)
ECHOCARDIOGRAM COMPLETED.

## 2016-03-21 DIAGNOSIS — N179 Acute kidney failure, unspecified: Secondary | ICD-10-CM

## 2016-03-21 LAB — BASIC METABOLIC PANEL
ANION GAP: 5 (ref 5–15)
BUN: 17 mg/dL (ref 6–20)
CALCIUM: 6.8 mg/dL — AB (ref 8.9–10.3)
CO2: 23 mmol/L (ref 22–32)
Chloride: 109 mmol/L (ref 101–111)
Creatinine, Ser: 2.89 mg/dL — ABNORMAL HIGH (ref 0.61–1.24)
GFR calc non Af Amer: 21 mL/min — ABNORMAL LOW (ref >60)
GFR, EST AFRICAN AMERICAN: 25 mL/min — AB (ref >60)
Glucose, Bld: 94 mg/dL (ref 65–99)
Potassium: 3.5 mmol/L (ref 3.5–5.1)
SODIUM: 137 mmol/L (ref 135–145)

## 2016-03-21 MED ORDER — FERROUS SULFATE 325 (65 FE) MG PO TABS: 325.0000 mg | ORAL_TABLET | Freq: Two times a day (BID) | ORAL | 0 refills | Status: DC

## 2016-03-21 MED ORDER — FERROUS SULFATE 325 (65 FE) MG PO TABS: 325.0000 mg | ORAL_TABLET | Freq: Two times a day (BID) | ORAL | Status: DC

## 2016-03-21 MED ORDER — HEPARIN SOD (PORK) LOCK FLUSH 100 UNIT/ML IV SOLN
500.0000 [IU] | Freq: Once | INTRAVENOUS | Status: AC
  Administered 2016-03-21: 500 [IU]
  Filled 2016-03-21: qty 5

## 2016-03-21 NOTE — Progress Notes (Signed)
[name]  MRN: [id number]  DOB/AGE: 10/06/1950 65 y.o.  Primary Care Physician:CAPLAN,Jabir, MD  Admit date: 03/19/2016  Chief Complaint:  Chief Complaint  Patient presents with  . Shortness of Breath    S-Pt presented on  03/19/2016 with  Chief Complaint  Patient presents with  . Shortness of Breath  .    Pt offer no c/o chest pain/dyspnea.    Meds  . acidophilus  1 capsule Oral Daily  . atorvastatin  20 mg Oral QHS  . calcium carbonate  1 tablet Oral Q breakfast  . cholecalciferol  1,000 Units Oral Daily  . docusate sodium  100 mg Oral BID  . enoxaparin  60 mg Subcutaneous QHS  . ferrous sulfate  325 mg Oral Q breakfast  . levETIRAcetam  250 mg Oral BID  . magnesium oxide  400 mg Oral BID  . multivitamin with minerals  1 tablet Oral Daily  . pantoprazole  80 mg Oral Daily  . sertraline  150 mg Oral QHS  . tamsulosin  0.4 mg Oral QHS  . thiamine  100 mg Oral Daily        Physical Exam: Vital signs in last 24 hours: Temp:  [98 F (36.7 C)-98.3 F (36.8 C)] 98.3 F (36.8 C) (12/31 0702) Pulse Rate:  [86-96] 96 (12/31 0702) Resp:  [18-20] 18 (12/31 0702) BP: (93-144)/(67-81) 119/81 (12/31 0702) SpO2:  [98 %] 98 % (12/31 0702) Weight change:  Last BM Date: 03/20/16  Intake/Output from previous day: 12/30 0701 - 12/31 0700 In: 2161.3 [P.O.:600; I.V.:1561.3] Out: 825 [Urine:825] No intake/output data recorded.   Physical Exam: General- pt is awake,alert, oriented to time place and person Resp- No acute REsp distress, CTA B/L NO Rhonchi CVS- S1S2 regular ij rate and rhythm GIT- BS+, soft, NT, ND EXT- NO LE Edema, Cyanosis   Lab Results: CBC  Recent Labs  03/19/16 1133 03/19/16 1323  WBC 9.8 8.2  HGB 11.9* 10.7*  HCT 37.9* 33.3*  PLT 269 227    BMET  Recent Labs  03/20/16 0644 03/21/16 0659  NA 135 137  K 3.5 3.5  CL 105 109  CO2 23 23  GLUCOSE 92 94  BUN 21* 17  CREATININE 3.86* 2.89*  CALCIUM 6.5* 6.8*   Creat  trend 2017  1.3==>4.98==> 3.86=>2.89 2016   1.3    MICRO No results found for this or any previous visit (from the past 240 hour(s)).    Lab Results  Component Value Date   CALCIUM 6.8 (L) 03/21/2016   CAION 1.18 03/03/2011   PHOS 2.9 09/18/2013        FENA 1%       Impression: 1)Renal  AKI secondary to Prerenal/ATN                AKI sec to Hypotension/ HCTZ/ACE                AKI on CKD                CKD sec to HTN                CKD since  2016                 AKI now better                Creat trending down  2)CVS- hemodynamically fragile           BP on lower side   3)Anemia HGb low 10/28/15      03/19/16 15.9   ===> 10.7  Iron def anemia as saturation and ferrittin on lower side   4)Hypocalcemia          Vitamin d within normal range when checked in August          On replacement                   5)Oncology-hx of colon ca S/p Sx and Chemo  Primary MD following  6)Electrolytes  hypokalemic   Now better  NOrmonatremic   7)Acid base Co2 at goal     Plan:  Will continue current care.      Daniel Johndrow S 03/21/2016, 9:02 AM

## 2016-03-21 NOTE — Discharge Summary (Signed)
[name]:[id number] DOB: [birth year] DOA: 03/19/2016  PCP: [name], MD  Admit date: 03/19/2016  Discharge date: 03/21/2016  Admitted From: Home  Disposition:  Home   Recommendations for Outpatient Follow-up:   Follow up with PCP in 1-2 weeks  PCP Please obtain BMP/CBC, 2 view CXR in 1week,  (see Discharge instructions)   PCP Please follow up on the following pending results: Monitor BMP and Iron Panel closely   Home Health: None   Equipment/Devices: None  Consultations: Renal Discharge Condition: Stable   CODE STATUS: Full   Diet Recommendation: Heart Healthy    Chief Complaint  Patient presents with  . Shortness of Breath     Brief history of present illness from the day of admission and additional interim summary    [name] a 65 y.o.malewith medical history significant forhypertension, GERD, colon cancer in remission, history of PE on therapeutic Lovenox, anxiety, and depression who presents to emergency department with progressive dyspnea, generalized weakness, and lightheadedness, workup showed acute renal failure.  Hospital issues addressed     1.Acute renal failure due to dehydration and hypotension caused by HCTZ in the setting of ARB with ATN. Renal ultrasound and CT abdomen pelvis nonacute, renal function improved with IV fluids with excellent UOP. Seen by renal, will DC home, hold off HCTZ - ARB, follow with PCP in a week get BMP rechecked.  2. History of colon cancer. Follow outpatient with oncologist and PCP. CEA ordered upon admission was stable.  3. History of PE. Continue Lovenox.  4. GERD/Barrett's esophagus. Continue with PPI and outpatient GI follow-up.  5. Essential hypertension. Blood pressure soft. Treatment as in #1 above.  6. Anemia -  Anemia panel showed iron deficiency, placed on iron supplementation, request PCP to monitor iron panel and H&H closely, outpatient age-appropriate workup in a patient with history of colon cancer to be done by PCP.   Discharge diagnosis     Principal Problem:   Acute renal failure (HCC) Active Problems:   Personal history of PE (pulmonary embolism)   Esophageal reflux disease   Essential hypertension   Hx of colon cancer, stage III   Normocytic anemia   Hypotension   Anemia   History of colon cancer, stage III    Discharge instructions    Discharge Instructions    Diet - low sodium heart healthy    Complete by:  As directed    Discharge instructions    Complete by:  As directed    Follow with Primary MD [name], MD in 7 days   Get CBC, CMP, Iron panel,  2 view Chest X ray checked  by Primary MD or SNF MD in 5-7 days ( we routinely change or add medications that can affect your baseline labs and fluid status, therefore we recommend that you get the mentioned basic workup next visit with your PCP, your PCP may decide not to get them or add new tests based on their clinical decision)   Activity: As tolerated with Full fall precautions  use walker/cane & assistance as needed   Disposition Home    Diet: Heart Healthy   For Heart failure patients - Check your Weight same time everyday, if you gain over 2 pounds, or you develop in leg swelling, experience more shortness of breath or chest pain, call your Primary MD immediately. Follow Cardiac Low Salt Diet and 1.5 lit/day fluid restriction.   On your next visit with your primary care physician please Get Medicines reviewed and adjusted.   Please request your Prim.MD to go over all Hospital Tests and Procedure/Radiological results at the follow up, please get all Hospital records sent to your Prim MD by signing hospital release before you go home.   If you experience worsening of your admission symptoms, develop  shortness of breath, life threatening emergency, suicidal or homicidal thoughts you must seek medical attention immediately by calling 911 or calling your MD immediately  if symptoms less severe.  You Must read complete instructions/literature along with all the possible adverse reactions/side effects for all the Medicines you take and that have been prescribed to you. Take any new Medicines after you have completely understood and accpet all the possible adverse reactions/side effects.   Do not drive, operate heavy machinery, perform activities at heights, swimming or participation in water activities or provide baby sitting services if your were admitted for syncope or siezures until you have seen by Primary MD or a Neurologist and advised to do so again.  Do not drive when taking Pain medications.    Do not take more than prescribed Pain, Sleep and Anxiety Medications  Special Instructions: If you have smoked or chewed Tobacco  in the last 2 yrs please stop smoking, stop any regular Alcohol  and or any Recreational drug use.  Wear Seat belts while driving.   Please note  You were cared for by a hospitalist during your hospital stay. If you have any questions about your discharge medications or the care you received while you were in the hospital after you are discharged, you can call the unit and asked to speak with the hospitalist on call if the hospitalist that took care of you is not available. Once you are discharged, your primary care physician will handle any further medical issues. Please note that NO REFILLS for any discharge medications will be authorized once you are discharged, as it is imperative that you return to your primary care physician (or establish a relationship with a primary care physician if you do not have one) for your aftercare needs so that they can reassess your need for medications and monitor your lab values.   Increase activity slowly    Complete by:  As  directed       Discharge Medications   Allergies as of 03/21/2016   No Known Allergies     Medication List    STOP taking these medications   valsartan-hydrochlorothiazide 80-12.5 MG tablet Commonly known as:  DIOVAN-HCT     TAKE these medications   acetaminophen 500 MG tablet Commonly known as:  TYLENOL Take 1,000 mg by mouth every 6 (six) hours as needed for mild pain, moderate pain or headache.   acidophilus Caps capsule Take 1 capsule by mouth daily.   atorvastatin 20 MG tablet Commonly known as:  LIPITOR Take 20 mg by mouth at bedtime.   cholecalciferol 1000 units tablet Commonly known as:  VITAMIN D Take 1,000 Units by mouth daily.   cyanocobalamin 1000 MCG/ML injection Commonly known as:  (VITAMIN B-12)  Inject 1,000 mcg into the muscle every 30 (thirty) days.   DEXILANT 60 MG capsule Generic drug:  dexlansoprazole Take 60 mg by mouth daily.   docusate sodium 100 MG capsule Commonly known as:  COLACE Take 100 mg by mouth 2 (two) times daily.   enoxaparin 60 MG/0.6ML injection Commonly known as:  LOVENOX Inject 60 mg into the skin at bedtime.   ENSURE PLUS HN Liqd Take 1 Bottle by mouth every other day.   ferrous sulfate 325 (65 FE) MG tablet Take 1 tablet (325 mg total) by mouth 2 (two) times daily with a meal. What changed:  when to take this   levETIRAcetam 750 MG tablet Commonly known as:  KEPPRA Take 750 mg by mouth 2 (two) times daily.   lidocaine-prilocaine cream Commonly known as:  EMLA Apply 1 application topically as needed (prior to accessing port).   LORazepam 1 MG tablet Commonly known as:  ATIVAN Take 1 mg by mouth every 4 (four) hours as needed for anxiety.   MAGNESIUM-OXIDE 400 (241.3 Mg) MG tablet Generic drug:  magnesium oxide Take 400 mg by mouth 2 (two) times daily.   multivitamin with minerals Tabs tablet Take 1 tablet by mouth daily.   ondansetron 4 MG tablet Commonly known as:  ZOFRAN Take 1 tablet (4 mg total)  by mouth every 8 (eight) hours as needed for nausea.   polyethylene glycol packet Commonly known as:  MIRALAX / GLYCOLAX Take 17 g by mouth daily as needed for mild constipation or moderate constipation.   PROAIR HFA 108 (90 Base) MCG/ACT inhaler Generic drug:  albuterol Inhale 1-2 puffs into the lungs every 6 (six) hours as needed for wheezing or shortness of breath.   prochlorperazine 10 MG tablet Commonly known as:  COMPAZINE Take 10 mg by mouth every 6 (six) hours as needed for nausea or vomiting.   promethazine 25 MG tablet Commonly known as:  PHENERGAN Take 1 tablet (25 mg total) by mouth every 6 (six) hours as needed for nausea.   RECLAST 5 MG/100ML Soln injection Generic drug:  zoledronic acid Inject 5 mg into the vein See admin instructions. Pt gets a dose yearly.   sertraline 100 MG tablet Commonly known as:  ZOLOFT Take 150 mg by mouth at bedtime.   tamsulosin 0.4 MG Caps capsule Commonly known as:  FLOMAX Take 0.4 mg by mouth at bedtime.   thiamine 100 MG tablet Take 100 mg by mouth daily.   Vitamin D (Ergocalciferol) 50000 units Caps capsule Commonly known as:  DRISDOL Take 50,000 Units by mouth every Sunday.       Follow-up Information    CAPLAN,Bolden, MD. Schedule an appointment as soon as possible for a visit in 1 week(s).   Specialty:  Internal Medicine Contact information: [address] # [place] [address] [phone]           Major procedures and Radiology Reports - PLEASE review detailed and final reports thoroughly  -         Ct Abdomen Pelvis Wo Contrast  Result Date: 03/19/2016 CLINICAL DATA:  SOB, bilateral shoulder pain, hx of colon cancer stage III treatment surgery and chemo.Today acute renal failure, anemia EXAM: CT ABDOMEN AND PELVIS WITHOUT CONTRAST TECHNIQUE: Multidetector CT imaging of the abdomen and pelvis was performed following the standard protocol without IV contrast. COMPARISON:  02/20/2015 FINDINGS:  Lower chest: No acute abnormality. Hepatobiliary: No focal liver abnormality is seen. Status post cholecystectomy. No biliary dilatation. Pancreas: Unremarkable. No  pancreatic ductal dilatation or surrounding inflammatory changes. Spleen: Normal in size without focal abnormality. Adrenals/Urinary Tract: No adrenal masses. Bilateral renal cortical thinning. Low-density right renal masses, largest arising from the midpole measuring 4.8 cm, relatively stable from the prior study and consistent with cysts. There are bilateral nonobstructing intrarenal stones. No hydronephrosis. Ureters normal course and in caliber. Bladder is unremarkable. Stomach/Bowel: There has been previous stomach surgery with formation of a gastrojejunostomy. 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 There has also been a colon resection with formation of an anastomosis in the lower pelvis. Remaining colon in the anterior pelvis low abdomen is dilated to a maximum of 10 cm. There is no bowel wall thickening. No convincing obstruction. No mesenteric inflammation. Vascular/Lymphatic: No enlarged lymph nodes. Mild scattered atherosclerotic calcifications noted along a normal caliber abdominal aorta. Reproductive: Mild enlargement of prostate gland. Other: No abdominal wall hernia.  No ascites. Musculoskeletal: No acute findings. No osteoblastic or osteolytic lesions. IMPRESSION: 1. No acute findings within the abdomen or pelvis. 2. The residual colon in the low abdomen and pelvis is dilated to 10 cm, which is similar to the prior CT. There is no bowel wall thickening or inflammation. 3. There changes from a partial gastric resection, formation of a gastrojejunostomy and colon resection with a pelvic anastomosis, all stable from the prior study. 4. Mild bilateral renal cortical thinning. Right renal cysts. Bilateral renal calcifications, but no ureteral stone or obstructive  uropathy. Electronically Signed   By: Lajean Manes M.D.   On: 03/19/2016 16:14   Dg Chest 2 View  Result Date: 03/19/2016 CLINICAL DATA:  Shortness of breath for 2-3 days, some dizziness, former smoking history EXAM: CHEST  2 VIEW COMPARISON:  Chest x-ray of 03/04/2016 and CT chest of the same date FINDINGS: No active infiltrate or effusion is seen. Mediastinal and hilar contours are unremarkable. The heart is within normal limits in size. A left-sided Port-A-Cath is present with the tip seen to the mid SVC. Partial compression deformity of T8 vertebral body is again noted. IMPRESSION: No active lung disease.  Port-A-Cath tip overlies the mid SVC. Electronically Signed   By: Ivar Drape M.D.   On: 03/19/2016 12:47   US Renal  Result Date: 03/20/2016 CLINICAL DATA:  Acute renal failure EXAM: RENAL / URINARY TRACT ULTRASOUND COMPLETE COMPARISON:  CT abdomen pelvis March 19, 2016 FINDINGS: Right Kidney: Length: 12.3 cm. There are 2 right kidney cysts. The larger is in the lower pole measuring 4.6 x 4.7 x 4.7 cm. There is a focal echogenic focus within the right kidney correlating to the nonobstructing stone noted on the CT scan in the midpole right kidney. No hydronephrosis visualized. Left Kidney: Length: 12 cm. Echogenicity within normal limits. No mass or hydronephrosis visualized. Bladder: Appears normal for degree of bladder distention. The ureteral jets are not visualized. IMPRESSION: Right kidney cysts. Focal nonobstructing stone in the right kidney. No hydronephrosis bilaterally. Electronically Signed   By: Abelardo Diesel M.D.   On: 03/20/2016 11:38    Micro Results     No results found for this or any previous visit (from the past 240 hour(s)).  Today   Subjective    [name] today has no headache,no chest abdominal pain,no new weakness tingling or numbness, feels much better wants to go home today.     Objective   Blood pressure 119/81, pulse 96, temperature 98.3 F (36.8  C), temperature source Oral,  resp. rate 18, height '5\' 9"'$  (1.753 m), weight 70.5 kg (155 lb 8 oz), SpO2 98 %.   Intake/Output Summary (Last 24 hours) at 03/21/16 1011 Last data filed at 03/21/16 0940  Gross per 24 hour  Intake          2161.25 ml  Output              825 ml  Net          1336.25 ml    Exam Awake Alert, Oriented x 3, No new F.N deficits, Normal affect [place].AT,PERRAL Supple Neck,No JVD, No cervical lymphadenopathy appriciated.  Symmetrical Chest wall movement, Good air movement bilaterally, CTAB RRR,No Gallops,Rubs or new Murmurs, No Parasternal Heave +ve B.Sounds, Abd Soft, Non tender, No organomegaly appriciated, No rebound -guarding or rigidity. No Cyanosis, Clubbing or edema, No new Rash or bruise   Data Review   CBC w Diff:  Lab Results  Component Value Date   WBC 8.2 03/19/2016   HGB 10.7 (L) 03/19/2016   HCT 33.3 (L) 03/19/2016   PLT 227 03/19/2016   LYMPHOPCT 19 03/19/2016   BANDSPCT 0 10/07/2012   MONOPCT 14 03/19/2016   EOSPCT 2 03/19/2016   BASOPCT 0 03/19/2016    CMP:  Lab Results  Component Value Date   NA 137 03/21/2016   K 3.5 03/21/2016   CL 109 03/21/2016   CO2 23 03/21/2016   BUN 17 03/21/2016   CREATININE 2.89 (H) 03/21/2016   PROT 6.1 (L) 03/19/2016   ALBUMIN 3.6 03/19/2016   BILITOT 0.4 03/19/2016   ALKPHOS 110 03/19/2016   AST 23 03/19/2016   ALT 29 03/19/2016  .   Total Time in preparing paper work, data evaluation and todays exam - 35 minutes  Thurnell Lose M.D on 03/21/2016 at 10:11 AM  [facility]   Office  [phone]

## 2016-03-21 NOTE — Discharge Instructions (Signed)
Follow with Primary MD [name], MD in 7 days   Get CBC, CMP, Iron panel,  2 view Chest X ray checked  by Primary MD or SNF MD in 5-7 days ( we routinely change or add medications that can affect your baseline labs and fluid status, therefore we recommend that you get the mentioned basic workup next visit with your PCP, your PCP may decide not to get them or add new tests based on their clinical decision)   Activity: As tolerated with Full fall precautions use walker/cane & assistance as needed   Disposition Home    Diet: Heart Healthy   For Heart failure patients - Check your Weight same time everyday, if you gain over 2 pounds, or you develop in leg swelling, experience more shortness of breath or chest pain, call your Primary MD immediately. Follow Cardiac Low Salt Diet and 1.5 lit/day fluid restriction.   On your next visit with your primary care physician please Get Medicines reviewed and adjusted.   Please request your Prim.MD to go over all Hospital Tests and Procedure/Radiological results at the follow up, please get all Hospital records sent to your Prim MD by signing hospital release before you go home.   If you experience worsening of your admission symptoms, develop shortness of breath, life threatening emergency, suicidal or homicidal thoughts you must seek medical attention immediately by calling 911 or calling your MD immediately  if symptoms less severe.  You Must read complete instructions/literature along with all the possible adverse reactions/side effects for all the Medicines you take and that have been prescribed to you. Take any new Medicines after you have completely understood and accpet all the possible adverse reactions/side effects.   Do not drive, operate heavy machinery, perform activities at heights, swimming or participation in water activities or provide baby sitting services if your were admitted for syncope or siezures until you have seen by Primary  MD or a Neurologist and advised to do so again.  Do not drive when taking Pain medications.    Do not take more than prescribed Pain, Sleep and Anxiety Medications  Special Instructions: If you have smoked or chewed Tobacco  in the last 2 yrs please stop smoking, stop any regular Alcohol  and or any Recreational drug use.  Wear Seat belts while driving.   Please note  You were cared for by a hospitalist during your hospital stay. If you have any questions about your discharge medications or the care you received while you were in the hospital after you are discharged, you can call the unit and asked to speak with the hospitalist on call if the hospitalist that took care of you is not available. Once you are discharged, your primary care physician will handle any further medical issues. Please note that NO REFILLS for any discharge medications will be authorized once you are discharged, as it is imperative that you return to your primary care physician (or establish a relationship with a primary care physician if you do not have one) for your aftercare needs so that they can reassess your need for medications and monitor your lab values.

## 2016-03-21 NOTE — Progress Notes (Signed)
Pt discharged home today per Dr. Memon. Pt's IV site D/C'd and WDL. Pt's VSS. Pt provided with home medication list, discharge instructions and prescriptions. Verbalized understanding. Pt left floor via WC in stable condition accompanied by NT.  

## 2016-03-23 ENCOUNTER — Other Ambulatory Visit (HOSPITAL_COMMUNITY): Payer: Self-pay | Admitting: Oncology

## 2016-03-23 ENCOUNTER — Encounter (HOSPITAL_COMMUNITY): Payer: Self-pay | Admitting: Oncology

## 2016-03-23 DIAGNOSIS — D509 Iron deficiency anemia, unspecified: Secondary | ICD-10-CM

## 2016-03-23 HISTORY — DX: Iron deficiency anemia, unspecified: D50.9

## 2016-03-31 ENCOUNTER — Ambulatory Visit (HOSPITAL_COMMUNITY): Payer: Medicare Other

## 2016-04-06 ENCOUNTER — Encounter (HOSPITAL_COMMUNITY): Payer: Medicare Other | Attending: Hematology & Oncology

## 2016-04-06 ENCOUNTER — Encounter (HOSPITAL_COMMUNITY): Payer: Self-pay

## 2016-04-06 VITALS — BP 150/93 | HR 72 | Temp 97.6°F | Resp 18

## 2016-04-06 DIAGNOSIS — D509 Iron deficiency anemia, unspecified: Secondary | ICD-10-CM

## 2016-04-06 DIAGNOSIS — Z95828 Presence of other vascular implants and grafts: Secondary | ICD-10-CM | POA: Insufficient documentation

## 2016-04-06 MED ORDER — HEPARIN SOD (PORK) LOCK FLUSH 100 UNIT/ML IV SOLN
500.0000 [IU] | Freq: Once | INTRAVENOUS | Status: AC
  Administered 2016-04-06: 500 [IU] via INTRAVENOUS
  Filled 2016-04-06: qty 5

## 2016-04-06 MED ORDER — SODIUM CHLORIDE 0.9 % IV SOLN
510.0000 mg | Freq: Once | INTRAVENOUS | Status: AC
  Administered 2016-04-06: 510 mg via INTRAVENOUS
  Filled 2016-04-06: qty 17

## 2016-04-06 MED ORDER — SODIUM CHLORIDE 0.9 % IV SOLN
Freq: Once | INTRAVENOUS | Status: AC
  Administered 2016-04-06: 14:00:00 via INTRAVENOUS

## 2016-04-06 NOTE — Progress Notes (Signed)
Tolerated tx w/o adverse reaction.  Alert, in no distress.  VSS.  Discharged ambulatory. 

## 2016-04-06 NOTE — Patient Instructions (Signed)
[facility] Discharge Instructions  RECOMMENDATIONS MADE BY THE CONSULTANT AND ANY TEST RESULTS WILL BE SENT TO YOUR REFERRING PHYSICIAN.  Iron infusion today. Return as scheduled for lab work and office visit.   Thank you for choosing [facility] to provide your oncology and hematology care.  To afford each patient quality time with our provider, please arrive at least 15 minutes before your scheduled appointment time.    If you have a lab appointment with the Cancer Center please come in thru the  Main Entrance and check in at the main information desk  You need to re-schedule your appointment should you arrive 10 or more minutes late.  We strive to give you quality time with our providers, and arriving late affects you and other patients whose appointments are after yours.  Also, if you no show three or more times for appointments you may be dismissed from the clinic at the providers discretion.     Again, thank you for choosing [facility].  Our hope is that these requests will decrease the amount of time that you wait before being seen by our physicians.       _____________________________________________________________  Should you have questions after your visit to  Cancer Center, please contact our office at [phone] between the hours of 8:30 a.m. and 4:30 p.m.  Voicemails left after 4:30 p.m. will not be returned until the following business day.  For prescription refill requests, have your pharmacy contact our office.       Resources For Cancer Patients and their Caregivers ? American Cancer Society: Can assist with transportation, wigs, general needs, runs Look Good Feel Better.        1-888-227-6333 ? Cancer Care: Provides financial assistance, online support groups, medication/co-pay assistance.  1-800-813-HOPE (4673) ? [facility] Assists  [place] cancer patients and their families through emotional , educational and financial support.  336-427-4357 ? [place] DSS Where to apply for food stamps, Medicaid and utility assistance. 336-342-1394 ? [facility]: Transportation to medical appointments. 336-347-2287 ? Social Security Administration: May apply for disability if have a Stage IV cancer. 336-342-7796 1-800-772-1213 ? [place] Aging, Disability and Transit Services: Assists with nutrition, care and transit needs. 336-349-2343  Cancer Center Support Programs: @10RELATIVEDAYS@ > Cancer Support Group  2nd Tuesday of the month 1pm-2pm, Journey Room  > Creative Journey  3rd Tuesday of the month 1130am-1pm, Journey Room  > Look Good Feel Better  1st Wednesday of the month 10am-12 noon, Journey Room (Call American Cancer Society to register 1-800-395-5775)   

## 2016-04-30 ENCOUNTER — Ambulatory Visit (HOSPITAL_COMMUNITY): Payer: Medicare Other | Admitting: Oncology

## 2016-04-30 ENCOUNTER — Encounter: Payer: Self-pay | Admitting: Internal Medicine

## 2016-04-30 ENCOUNTER — Encounter (HOSPITAL_COMMUNITY): Payer: Self-pay | Admitting: Oncology

## 2016-04-30 ENCOUNTER — Ambulatory Visit (HOSPITAL_COMMUNITY): Payer: Medicare Other | Admitting: Hematology & Oncology

## 2016-04-30 ENCOUNTER — Encounter (HOSPITAL_COMMUNITY): Payer: Medicare Other | Attending: Adult Health

## 2016-04-30 ENCOUNTER — Encounter (HOSPITAL_BASED_OUTPATIENT_CLINIC_OR_DEPARTMENT_OTHER): Payer: Medicare Other | Admitting: Oncology

## 2016-04-30 VITALS — BP 136/89 | HR 88 | Temp 99.1°F | Resp 20 | Ht 69.0 in | Wt 159.0 lb

## 2016-04-30 DIAGNOSIS — C189 Malignant neoplasm of colon, unspecified: Secondary | ICD-10-CM | POA: Diagnosis present

## 2016-04-30 DIAGNOSIS — Z903 Acquired absence of stomach [part of]: Secondary | ICD-10-CM

## 2016-04-30 DIAGNOSIS — K210 Gastro-esophageal reflux disease with esophagitis: Secondary | ICD-10-CM | POA: Insufficient documentation

## 2016-04-30 DIAGNOSIS — Z8249 Family history of ischemic heart disease and other diseases of the circulatory system: Secondary | ICD-10-CM | POA: Diagnosis not present

## 2016-04-30 DIAGNOSIS — Z87891 Personal history of nicotine dependence: Secondary | ICD-10-CM | POA: Diagnosis not present

## 2016-04-30 DIAGNOSIS — Z9889 Other specified postprocedural states: Secondary | ICD-10-CM | POA: Insufficient documentation

## 2016-04-30 DIAGNOSIS — D509 Iron deficiency anemia, unspecified: Secondary | ICD-10-CM | POA: Insufficient documentation

## 2016-04-30 DIAGNOSIS — G4730 Sleep apnea, unspecified: Secondary | ICD-10-CM | POA: Insufficient documentation

## 2016-04-30 DIAGNOSIS — Z86711 Personal history of pulmonary embolism: Secondary | ICD-10-CM | POA: Diagnosis not present

## 2016-04-30 DIAGNOSIS — Z8261 Family history of arthritis: Secondary | ICD-10-CM | POA: Diagnosis not present

## 2016-04-30 DIAGNOSIS — Z9049 Acquired absence of other specified parts of digestive tract: Secondary | ICD-10-CM | POA: Diagnosis not present

## 2016-04-30 DIAGNOSIS — Z8673 Personal history of transient ischemic attack (TIA), and cerebral infarction without residual deficits: Secondary | ICD-10-CM | POA: Diagnosis not present

## 2016-04-30 DIAGNOSIS — K56609 Unspecified intestinal obstruction, unspecified as to partial versus complete obstruction: Secondary | ICD-10-CM | POA: Diagnosis not present

## 2016-04-30 DIAGNOSIS — K660 Peritoneal adhesions (postprocedural) (postinfection): Secondary | ICD-10-CM | POA: Diagnosis not present

## 2016-04-30 DIAGNOSIS — Z7901 Long term (current) use of anticoagulants: Secondary | ICD-10-CM | POA: Insufficient documentation

## 2016-04-30 DIAGNOSIS — I10 Essential (primary) hypertension: Secondary | ICD-10-CM | POA: Diagnosis not present

## 2016-04-30 DIAGNOSIS — R0602 Shortness of breath: Secondary | ICD-10-CM | POA: Diagnosis not present

## 2016-04-30 DIAGNOSIS — G8929 Other chronic pain: Secondary | ICD-10-CM | POA: Insufficient documentation

## 2016-04-30 DIAGNOSIS — A0472 Enterocolitis due to Clostridium difficile, not specified as recurrent: Secondary | ICD-10-CM | POA: Diagnosis not present

## 2016-04-30 DIAGNOSIS — F419 Anxiety disorder, unspecified: Secondary | ICD-10-CM | POA: Insufficient documentation

## 2016-04-30 LAB — COMPREHENSIVE METABOLIC PANEL
ALBUMIN: 3.9 g/dL (ref 3.5–5.0)
ALK PHOS: 83 U/L (ref 38–126)
ALT: 16 U/L — ABNORMAL LOW (ref 17–63)
ANION GAP: 8 (ref 5–15)
AST: 23 U/L (ref 15–41)
BUN: 8 mg/dL (ref 6–20)
CALCIUM: 8.7 mg/dL — AB (ref 8.9–10.3)
CO2: 23 mmol/L (ref 22–32)
Chloride: 107 mmol/L (ref 101–111)
Creatinine, Ser: 1.73 mg/dL — ABNORMAL HIGH (ref 0.61–1.24)
GFR calc non Af Amer: 40 mL/min — ABNORMAL LOW (ref >60)
GFR, EST AFRICAN AMERICAN: 46 mL/min — AB (ref >60)
GLUCOSE: 84 mg/dL (ref 65–99)
Potassium: 4.5 mmol/L (ref 3.5–5.1)
Sodium: 138 mmol/L (ref 135–145)
TOTAL PROTEIN: 6.8 g/dL (ref 6.5–8.1)
Total Bilirubin: 0.4 mg/dL (ref 0.3–1.2)

## 2016-04-30 LAB — CBC WITH DIFFERENTIAL/PLATELET
Basophils Absolute: 0 10*3/uL (ref 0.0–0.1)
Basophils Relative: 0 %
Eosinophils Absolute: 0.3 10*3/uL (ref 0.0–0.7)
Eosinophils Relative: 3 %
HCT: 40.2 % (ref 39.0–52.0)
HEMOGLOBIN: 12.9 g/dL — AB (ref 13.0–17.0)
LYMPHS ABS: 2.2 10*3/uL (ref 0.7–4.0)
Lymphocytes Relative: 26 %
MCH: 26.5 pg (ref 26.0–34.0)
MCHC: 32.1 g/dL (ref 30.0–36.0)
MCV: 82.7 fL (ref 78.0–100.0)
MONO ABS: 0.8 10*3/uL (ref 0.1–1.0)
MONOS PCT: 10 %
NEUTROS ABS: 5 10*3/uL (ref 1.7–7.7)
NEUTROS PCT: 61 %
Platelets: 178 10*3/uL (ref 150–400)
RBC: 4.86 MIL/uL (ref 4.22–5.81)
RDW: 18.5 % — AB (ref 11.5–15.5)
WBC: 8.2 10*3/uL (ref 4.0–10.5)

## 2016-04-30 LAB — LACTATE DEHYDROGENASE: LDH: 146 U/L (ref 98–192)

## 2016-04-30 LAB — RETICULOCYTES
RBC.: 4.86 MIL/uL (ref 4.22–5.81)
Retic Count, Absolute: 82.6 10*3/uL (ref 19.0–186.0)
Retic Ct Pct: 1.7 % (ref 0.4–3.1)

## 2016-04-30 LAB — C-REACTIVE PROTEIN

## 2016-04-30 LAB — IRON AND TIBC
IRON: 91 ug/dL (ref 45–182)
SATURATION RATIOS: 26 % (ref 17.9–39.5)
TIBC: 353 ug/dL (ref 250–450)
UIBC: 262 ug/dL

## 2016-04-30 LAB — VITAMIN B12: Vitamin B-12: 469 pg/mL (ref 180–914)

## 2016-04-30 LAB — FERRITIN: FERRITIN: 64 ng/mL (ref 24–336)

## 2016-04-30 LAB — TSH: TSH: 1.081 u[IU]/mL (ref 0.350–4.500)

## 2016-04-30 MED ORDER — SODIUM CHLORIDE 0.9% FLUSH
10.0000 mL | INTRAVENOUS | Status: DC | PRN
  Administered 2016-04-30: 10 mL via INTRAVENOUS
  Filled 2016-04-30: qty 10

## 2016-04-30 MED ORDER — HEPARIN SOD (PORK) LOCK FLUSH 100 UNIT/ML IV SOLN
500.0000 [IU] | Freq: Once | INTRAVENOUS | Status: AC
  Administered 2016-04-30: 500 [IU] via INTRAVENOUS

## 2016-04-30 NOTE — Assessment & Plan Note (Addendum)
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  Last colonoscopy was in Feb 2017 by Dr. [name] with normal findings and negative pathology.  Next colonoscopy is due in 2022.  Labs today: CBC diff, CMET, iron/TIBC, ferritin, CEA.  I personally reviewed and went over laboratory results with the patient.  The results are noted within this dictation.  Chart is reviewed in detail.  Recent hospitalization is noted.  Renal function changes noted.  Improved compared to hospitalization.  Will need to consider nephrology referral in the future.  I personally reviewed and went over radiographic studies with the patient.  The results are noted within this dictation.  His last CT abd/pelvis imaging was performed in Dec 2017.  It was negative for recurrence.  Return in 3 months for follow-up.

## 2016-04-30 NOTE — Progress Notes (Signed)
[name] presented for Portacath access and flush. Portacath located leftchest wall accessed with  H 20 needle. Good blood return present. Portacath flushed with 74m NS and 500U/553mHeparin and needle removed intact. Procedure without incident. Patient tolerated procedure well.  Labs drawn as ordered

## 2016-04-30 NOTE — Progress Notes (Signed)
[name], MD [address] [place] [address]  Adenocarcinoma of colon with mucinous features - Plan: CBC with Differential, Comprehensive metabolic panel, CEA  Iron deficiency anemia, unspecified iron deficiency anemia type - Plan: Iron and TIBC, Ferritin, Iron and TIBC, Ferritin, CBC with Differential, Basic metabolic panel, Iron and TIBC, Ferritin, CBC with Differential, Basic metabolic panel, Iron and TIBC, Ferritin  S/P partial gastrectomy - Plan: Lactate dehydrogenase, Reticulocytes, Pathologist smear review, Vitamin B12, Folate, Haptoglobin, Erythropoietin, C-reactive protein, TSH, Copper, serum, Lactate dehydrogenase, Reticulocytes, Pathologist smear review, Vitamin B12, Folate, Haptoglobin, Erythropoietin, C-reactive protein, TSH, Copper, serum  CURRENT THERAPY: Surveillance per NCCN guidelines.  IV iron replacement when indicated.  INTERVAL HISTORY: [name] 66 y.o. 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  S/P colonoscopy/EGD by Dr. [name] on 05/15/2015 that was normal and repeat is recommended in 2022.  He has completed 5 years worth of surveillance in accordance with NCCN guidelines. AND Iron deficiency anemia AND He has a hisory of PE in November 2011 and December 2012. He is on lovenox which is managed by his primary care provider, Dr. 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  AND History of partial gastrectomy.  He is here sooner than planned due to recent hospitalization demonstrated severe anemia with iron deficiency.  He notes fatigue and tiredness. He denies any blood in his stools or dark stools. He denies any hemoptysis or  hematemesis. He denies any hematuria. He denies any significant blood loss.  He reports shortness of breath as well.  Weight is stable and improved.  Review of Systems  Constitutional: Positive for malaise/fatigue. Negative for chills, fever and weight loss.  HENT: Negative.   Eyes: Negative.   Respiratory: Negative.  Negative for cough and hemoptysis.   Cardiovascular: Negative.  Negative for chest pain.  Gastrointestinal: Negative.  Negative for abdominal pain, blood in stool, melena, nausea and vomiting.  Genitourinary: Negative.  Negative for hematuria.  Musculoskeletal: Negative.  Negative for falls.  Skin: Negative.   Neurological: Positive for weakness.  Endo/Heme/Allergies: Negative.  Does not bruise/bleed easily.  Psychiatric/Behavioral: Negative.     Past Medical History:  Diagnosis Date  . 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 Anemia   . Anxiety   . Arthritis   . Barrett's esophagus   . Blood transfusion   . Bowel obstruction 05/13/2012   Recurrent  . Bronchitis   . Chest pain at rest   . Chronic abdominal pain   . Erosive esophagitis   . ETOH abuse    quit 03/2010  . GERD (gastroesophageal reflux disease)   . Hx of Clostridium difficile infection 01/2012  . Hypertension   . Ileus ([place])   . Iron deficiency anemia 03/23/2016  . Obstruction of bowel 03/03/14  . Osteoporosis   . Personal history of PE (pulmonary embolism) 10/01/2010  . Pneumonia   . Pulmonary embolism ([place]) 02/2010  . Recurrent upper respiratory infection (URI)   . S/P endoscopy September 28, 2010   erosive reflux esophagitis, Billroth I anatomy  . 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 Seizures ([place])   .  Shortness of breath   . Sleep apnea   . TIA (transient ischemic attack) 10/11  . Vitamin B12 deficiency     Past Surgical History:  Procedure Laterality Date  . ABDOMINAL ADHESION SURGERY  03/04/15   @ [facility]  . ABDOMINAL EXPLORATION SURGERY    . abdominal sugery      for bowel obstruction x 8, all in 1980s, except for one in 07/2010  . APPENDECTOMY  1980s  . Billroth 1 hemigastrectomy  1980s   per patient for benign duodenal tumor  . CARDIAC CATHETERIZATION  07/17/2012  . CHOLECYSTECTOMY  1980s  . COLON SURGERY  May 2012   left hemicolectomy, colon cancer found at time of surgery for bowel obstruction  . COLONOSCOPY  03/18/2011   anastomosis at 35cm. Several adenomatous polyps removed. Sigmoid diverticulosis. Next TCS 02/2013  . COLONOSCOPY N/A 07/24/2012   ZOX:WRUEAV post segmental resection with normal-appearing colonic anastomosis aside from an adjacent polyp-removed as described above. Rectal polyp-removed as described above. CT findings appear to have been artifactual. tubular adenomas/prolapsed type polyp.  . COLONOSCOPY N/A 05/15/2015   Procedure: COLONOSCOPY;  Surgeon: [name], MD;  [facility];  Service: Endoscopy;  Laterality: N/A;  . ESOPHAGOGASTRODUODENOSCOPY  09/28/2010  . ESOPHAGOGASTRODUODENOSCOPY  12/01/2010   Cervical web status post dilation, erosive esophagitis, B1 hemigastrectomy, inflamed anastomosis  . ESOPHAGOGASTRODUODENOSCOPY  04/16/2011   excoriation at GEJ c/w trauma/M-W tear, friable gastric anastomosis, dilation efferent limb  . ESOPHAGOGASTRODUODENOSCOPY N/A 06/03/2014   Dr.[name]- cervcal esopphageal web s/p dilation. abnormal distal esophagus bx= barretts esophagus  . ESOPHAGOGASTRODUODENOSCOPY N/A 05/15/2015   Procedure: ESOPHAGOGASTRODUODENOSCOPY (EGD);  Surgeon: [name], MD;  [facility];  Service: Endoscopy;  Laterality: N/A;  230  . ESOPHAGOGASTRODUODENOSCOPY (EGD) WITH ESOPHAGEAL DILATION  02/25/2012   WUJ:[id number] esophageal web-s/p dilation anddisruption as described above. Status post prior gastric with Billroth I configuration. Abnormal gastric mucosa at the anastomosis. Gastric biopsy showed mild chronic inflammation but no H. pylori   . HERNIA REPAIR     right inguinal  . MALONEY  DILATION N/A 06/03/2014   Procedure: [name] DILATION;  Surgeon: [name], MD;  [facility];  Service: Endoscopy;  Laterality: N/A;  [name] DILATION N/A 05/15/2015   Procedure: [name] DILATION;  Surgeon: [name], MD;  [facility];  Service: Endoscopy;  Laterality: N/A;  . PORTACATH PLACEMENT    . SAVORY DILATION N/A 06/03/2014   Procedure: SAVORY DILATION;  Surgeon: [name], MD;  [facility];  Service: Endoscopy;  Laterality: N/A;    Family History  Problem Relation Age of Onset  . Hypertension Mother   . Arthritis Mother   . Pneumonia Mother   . Hypertension Father   . Heart attack Father     Social History   Social History  . Marital status: Married    Spouse name: N/A  . Number of children: 3  . Years of education: N/A   Occupational History  .  [employer]   Social History Main Topics  . Smoking status: Former Smoker    Packs/day: 0.50    Years: 40.00    Types: Cigarettes    Quit date: 12/20/2012  . Smokeless tobacco: Never Used  . Alcohol use No  . Drug use: No  . Sexual activity: No   Other Topics Concern  . None   Social History Narrative  . None     PHYSICAL EXAMINATION  ECOG  PERFORMANCE STATUS: 1 - Symptomatic but completely ambulatory  Vitals:   04/30/16 1530  BP: 136/89  Pulse: 88  Resp: 20  Temp: 99.1 F (37.3 C)    GENERAL:alert, no distress, well nourished, well developed, comfortable, cooperative, pale, smiling and unaccompanied SKIN: no rashes or significant lesions HEAD: Normocephalic, No masses, lesions, tenderness or abnormalities EYES: normal, EOMI EARS: External ears normal OROPHARYNX:lips, buccal mucosa, and tongue normal and mucous membranes are moist  NECK: supple, no adenopathy, trachea midline LYMPH:  no palpable lymphadenopathy BREAST:not examined LUNGS: clear to auscultation and percussion HEART: regular rate & rhythm, no murmurs and no gallops ABDOMEN:abdomen  soft and normal bowel sounds BACK: Back symmetric, no curvature. EXTREMITIES:less then 2 second capillary refill, no joint deformities, effusion, or inflammation, no skin discoloration, no cyanosis  NEURO: alert & oriented x 3 with fluent speech, no focal motor/sensory deficits, gait normal   LABORATORY DATA: CBC    Component Value Date/Time   WBC 8.2 04/30/2016 1630   RBC 4.86 04/30/2016 1630   RBC 4.86 04/30/2016 1630   HGB 12.9 (L) 04/30/2016 1630   HCT 40.2 04/30/2016 1630   PLT 178 04/30/2016 1630   MCV 82.7 04/30/2016 1630   MCH 26.5 04/30/2016 1630   MCHC 32.1 04/30/2016 1630   RDW 18.5 (H) 04/30/2016 1630   LYMPHSABS 2.2 04/30/2016 1630   MONOABS 0.8 04/30/2016 1630   EOSABS 0.3 04/30/2016 1630   BASOSABS 0.0 04/30/2016 1630      Chemistry      Component Value Date/Time   NA 138 04/30/2016 1630   K 4.5 04/30/2016 1630   CL 107 04/30/2016 1630   CO2 23 04/30/2016 1630   BUN 8 04/30/2016 1630   CREATININE 1.73 (H) 04/30/2016 1630      Component Value Date/Time   CALCIUM 8.7 (L) 04/30/2016 1630   ALKPHOS 83 04/30/2016 1630   AST 23 04/30/2016 1630   ALT 16 (L) 04/30/2016 1630   BILITOT 0.4 04/30/2016 1630      Lab Results  Component Value Date   CEA 3.3 03/19/2016   Lab Results  Component Value Date   IRON 75 03/20/2016   TIBC 377 03/20/2016   FERRITIN 8 (L) 03/20/2016     PENDING LABS:   RADIOGRAPHIC STUDIES:  No results found.   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  Last colonoscopy was in Feb 2017 by Dr. [name] with normal findings and negative pathology.  Next colonoscopy is due in 2022.  Labs today: CBC diff, CMET, iron/TIBC, ferritin, CEA.  I personally reviewed  and went over laboratory results with the patient.  The results are noted within this dictation.  Chart is reviewed in detail.  Recent hospitalization is noted.  Renal function changes noted.  Improved compared to hospitalization.  Will need to consider nephrology referral in the future.  I personally reviewed and went over radiographic studies with the patient.  The results are noted within this dictation.  His last CT abd/pelvis imaging was performed in Dec 2017.  It was negative for recurrence.  Return in 3 months for follow-up.    Iron deficiency anemia Iron deficiency anemia.  Labs today: CBC diff, anemia panel, LDH, retic count, haptoglobin, EP  level, CRP, TSH, serum copper, pathology smear review.  Labs in 6 weeks: CBC diff, BMET, iron/TIBC, ferritin.  Labs in 12 weeks: CBC diff, BMET, iron/TIBC, ferritin.    I will get him set-up for IV iron replacement therapy next week.   ORDERS PLACED FOR THIS ENCOUNTER: Orders Placed This Encounter  Procedures  . Lactate dehydrogenase  . Reticulocytes  . Pathologist smear review  . Vitamin B12  . Folate  . Iron and TIBC  . Ferritin  . Haptoglobin  . Erythropoietin  . C-reactive protein  . TSH  . Copper, serum  . CBC with Differential  . Basic metabolic panel  . Iron and TIBC  . Ferritin  . CBC with Differential  . Basic metabolic panel  . Iron and TIBC  . Ferritin    MEDICATIONS PRESCRIBED THIS ENCOUNTER: Meds ordered this encounter  Medications  . carvedilol (COREG) 6.25 MG tablet    Sig: Take 6.25 mg by mouth 2 (two) times daily with a meal.    THERAPY PLAN:  NCCN guidelines for surveillance for Colon cancer are as follows (1.2017):  A. Stage I   1. Colonoscopy at year 1    A. If advanced adenoma, repeat in 1 year    B. If no advanced adenoma, repeat in 3 years, and then every 5 years.  B. Stage II, Stage III   1. H+P every 3-6 months x 2 years and then every 6 months for a total of 5 years    2. CEA every  3-6 months x 2 years and then every 6 months for a total of 5 years    3. CT CAP every 6-12 months (category 2B for frequency < 12 months) for a total of 5 years .   4.  Colonoscopy in 1 year except if no preoperative colonoscopy due to obstructing lesion, colonoscopy in 3-6 months.     A. If advanced adenoma, repeat in 1 year    B. If no advanced adenoma, repeat in 3 years, then every 5 years   5. PET/CT scan is not recommended.  C. Stage IV   1. H+P every 3-6 months x 2 years and then every 6 months for a total of 5 years    2. CEA every 3 months x 2 years and then every 6 months for a total of 3- 5 years    3. CT CAP every 3-6 months (category 2B for frequency < 6 months) x 2 years., then every 6-12 months for a total of 5 years .   4. Colonoscopy in 1 year except if no preoperative colonoscopy due to obstructing lesion, colonoscopy in 3-6 months.     A. If advanced adenoma, repeat in 1 year    B. If no advanced adenoma, repeat in 3 years, then every 5 years   All questions were answered. The patient knows to call the clinic with any problems, questions or concerns. We can certainly see the patient much sooner if necessary.  Patient and plan discussed with Dr. Twana First and she is in agreement with the aforementioned.   This note is electronically signed by: Doy Mince 04/30/2016 6:02 PM

## 2016-04-30 NOTE — Addendum Note (Signed)
Addended by: Baird Cancer on: 04/30/2016 06:17 PM   Modules accepted: Level of Service

## 2016-04-30 NOTE — Assessment & Plan Note (Addendum)
Iron deficiency anemia.  Labs today: CBC diff, anemia panel, LDH, retic count, haptoglobin, EP level, CRP, TSH, serum copper, pathology smear review.  Will plan on providing IV iron replacement therapy next week based upon lab results.  Labs in 6 weeks: CBC diff, BMET, iron/TIBC, ferritin.  Labs in 12 weeks: CBC diff, BMET, iron/TIBC, ferritin.    I will get him set-up for IV iron replacement therapy next week.

## 2016-04-30 NOTE — Patient Instructions (Signed)
[place] at [facility] Discharge Instructions  RECOMMENDATIONS MADE BY THE CONSULTANT AND ANY TEST RESULTS WILL BE SENT TO YOUR REFERRING PHYSICIAN.  You were seen today by Kirby Crigler PA-C. Port flush and labs done today. We will call you with results. IV iron mid-next week. Port flush and labs in 6 and 12 weeks. Follow up in 12 weeks.  Thank you for choosing [place] at [facility] to provide your oncology and hematology care.  To afford each patient quality time with our provider, please arrive at least 15 minutes before your scheduled appointment time.    If you have a lab appointment with the [facility] please come in thru the  Main Entrance and check in at the main information desk  You need to re-schedule your appointment should you arrive 10 or more minutes late.  We strive to give you quality time with our providers, and arriving late affects you and other patients whose appointments are after yours.  Also, if you no show three or more times for appointments you may be dismissed from the clinic at the providers discretion.     Again, thank you for choosing [facility].  Our hope is that these requests will decrease the amount of time that you wait before being seen by our physicians.       _____________________________________________________________  Should you have questions after your visit to [facility], please contact our office at [phone] between the hours of 8:30 a.m. and 4:30 p.m.  Voicemails left after 4:30 p.m. will not be returned until the following business day.  For prescription refill requests, have your pharmacy contact our office.       Resources For Cancer Patients and their Caregivers ? American Cancer Society: Can assist with transportation, wigs, general needs, runs Look Good Feel Better.        418-263-2835 ? Cancer Care: Provides financial assistance, online support  groups, medication/co-pay assistance.  1-800-813-HOPE (916)287-6371) ? [facility] Assists [place] cancer patients and their families through emotional , educational and financial support.  650-176-1339 ? [place] DSS Where to apply for food stamps, Medicaid and utility assistance. 940-625-0441 ? RCATS: Transportation to medical appointments. (316)343-6871 ? Social Security Administration: May apply for disability if have a Stage IV cancer. 636-482-2168 (317) 509-8153 ? [employer], Disability and Transit Services: Assists with nutrition, care and transit needs. [place] Support Programs: '@10RELATIVEDAYS'$ @ > Cancer Support Group  2nd Tuesday of the month 1pm-2pm, Journey Room  > Creative Journey  3rd Tuesday of the month 1130am-1pm, Journey Room  > Look Good Feel Better  1st Wednesday of the month 10am-12 noon, Journey Room (Call [place] to register [phone])

## 2016-04-30 NOTE — Patient Instructions (Signed)
[place] at [facility] Discharge Instructions  RECOMMENDATIONS MADE BY THE CONSULTANT AND ANY TEST RESULTS WILL BE SENT TO YOUR REFERRING PHYSICIAN.  Port flush with labs Follow up as scheduled.  Thank you for choosing [place] at [facility] to provide your oncology and hematology care.  To afford each patient quality time with our provider, please arrive at least 15 minutes before your scheduled appointment time.    If you have a lab appointment with the [facility] please come in thru the  Main Entrance and check in at the main information desk  You need to re-schedule your appointment should you arrive 10 or more minutes late.  We strive to give you quality time with our providers, and arriving late affects you and other patients whose appointments are after yours.  Also, if you no show three or more times for appointments you may be dismissed from the clinic at the providers discretion.     Again, thank you for choosing [facility].  Our hope is that these requests will decrease the amount of time that you wait before being seen by our physicians.       _____________________________________________________________  Should you have questions after your visit to [facility], please contact our office at [phone] between the hours of 8:30 a.m. and 4:30 p.m.  Voicemails left after 4:30 p.m. will not be returned until the following business day.  For prescription refill requests, have your pharmacy contact our office.       Resources For Cancer Patients and their Caregivers ? American Cancer Society: Can assist with transportation, wigs, general needs, runs Look Good Feel Better.        (484)621-3093 ? Cancer Care: Provides financial assistance, online support groups, medication/co-pay assistance.  1-800-813-HOPE 716-125-0613) ? [facility] Assists [place] cancer patients and  their families through emotional , educational and financial support.  7796077529 ? [place] DSS Where to apply for food stamps, Medicaid and utility assistance. 912-693-1697 ? RCATS: Transportation to medical appointments. 819-641-9455 ? Social Security Administration: May apply for disability if have a Stage IV cancer. 339 281 5554 815-568-2849 ? [employer], Disability and Transit Services: Assists with nutrition, care and transit needs. [place] Support Programs: '@10RELATIVEDAYS'$ @ > Cancer Support Group  2nd Tuesday of the month 1pm-2pm, Journey Room  > Creative Journey  3rd Tuesday of the month 1130am-1pm, Journey Room  > Look Good Feel Better  1st Wednesday of the month 10am-12 noon, Journey Room (Call [place] to register [phone])

## 2016-05-01 LAB — ERYTHROPOIETIN: ERYTHROPOIETIN: 6 m[IU]/mL (ref 2.6–18.5)

## 2016-05-01 LAB — FOLATE: Folate: 53.4 ng/mL (ref >5.9)

## 2016-05-01 LAB — COPPER, SERUM: COPPER: 60 ug/dL — AB (ref 72–166)

## 2016-05-01 LAB — HAPTOGLOBIN: Haptoglobin: 229 mg/dL — ABNORMAL HIGH (ref 34–200)

## 2016-05-03 ENCOUNTER — Other Ambulatory Visit (HOSPITAL_COMMUNITY): Payer: Self-pay | Admitting: Oncology

## 2016-05-03 DIAGNOSIS — R790 Abnormal level of blood mineral: Secondary | ICD-10-CM

## 2016-05-03 MED ORDER — COPPER GLUCONATE 2 MG PO CAPS: 1.0000 | ORAL_CAPSULE | Freq: Every day | ORAL | 5 refills | Status: DC

## 2016-05-04 ENCOUNTER — Encounter (HOSPITAL_COMMUNITY): Payer: Self-pay

## 2016-05-04 ENCOUNTER — Encounter (HOSPITAL_BASED_OUTPATIENT_CLINIC_OR_DEPARTMENT_OTHER): Payer: Medicare Other

## 2016-05-04 VITALS — BP 128/76 | HR 88 | Temp 97.4°F | Resp 18

## 2016-05-04 DIAGNOSIS — D509 Iron deficiency anemia, unspecified: Secondary | ICD-10-CM | POA: Diagnosis present

## 2016-05-04 LAB — CEA: CEA: 2.9 ng/mL (ref 0.0–4.7)

## 2016-05-04 MED ORDER — HEPARIN SOD (PORK) LOCK FLUSH 100 UNIT/ML IV SOLN
INTRAVENOUS | Status: AC
  Filled 2016-05-04: qty 5

## 2016-05-04 MED ORDER — SODIUM CHLORIDE 0.9 % IV SOLN
Freq: Once | INTRAVENOUS | Status: AC
  Administered 2016-05-04: 10:00:00 via INTRAVENOUS

## 2016-05-04 MED ORDER — SODIUM CHLORIDE 0.9 % IV SOLN
510.0000 mg | Freq: Once | INTRAVENOUS | Status: AC
  Administered 2016-05-04: 510 mg via INTRAVENOUS
  Filled 2016-05-04: qty 17

## 2016-05-04 MED ORDER — HEPARIN SOD (PORK) LOCK FLUSH 100 UNIT/ML IV SOLN
500.0000 [IU] | Freq: Once | INTRAVENOUS | Status: AC
  Administered 2016-05-04: 500 [IU] via INTRAVENOUS

## 2016-05-04 NOTE — Progress Notes (Signed)
Feraheme given today per orders. Patient tolerated it well. Vitals stable and discharged ambulatory from clinic. Follow up as scheduled.

## 2016-05-04 NOTE — Patient Instructions (Signed)
[facility] at [facility] Discharge Instructions  RECOMMENDATIONS MADE BY THE CONSULTANT AND ANY TEST RESULTS WILL BE SENT TO YOUR REFERRING PHYSICIAN.  Feraheme given today Follow up as scheduled.  Thank you for choosing [facility] at [facility] to provide your oncology and hematology care.  To afford each patient quality time with our provider, please arrive at least 15 minutes before your scheduled appointment time.    If you have a lab appointment with the Cancer Center please come in thru the  Main Entrance and check in at the main information desk  You need to re-schedule your appointment should you arrive 10 or more minutes late.  We strive to give you quality time with our providers, and arriving late affects you and other patients whose appointments are after yours.  Also, if you no show three or more times for appointments you may be dismissed from the clinic at the providers discretion.     Again, thank you for choosing [facility].  Our hope is that these requests will decrease the amount of time that you wait before being seen by our physicians.       _____________________________________________________________  Should you have questions after your visit to [facility], please contact our office at [phone] between the hours of 8:30 a.m. and 4:30 p.m.  Voicemails left after 4:30 p.m. will not be returned until the following business day.  For prescription refill requests, have your pharmacy contact our office.       Resources For Cancer Patients and their Caregivers ? American Cancer Society: Can assist with transportation, wigs, general needs, runs Look Good Feel Better.        1-888-227-6333 ? Cancer Care: Provides financial assistance, online support groups, medication/co-pay assistance.  1-800-813-HOPE (4673) ? [facility] Assists [place] cancer patients and  their families through emotional , educational and financial support.  336-427-4357 ? [place] DSS Where to apply for food stamps, Medicaid and utility assistance. 336-342-1394 ? RCATS: Transportation to medical appointments. 336-347-2287 ? Social Security Administration: May apply for disability if have a Stage IV cancer. 336-342-7796 1-800-772-1213 ? [place] Aging, Disability and Transit Services: Assists with nutrition, care and transit needs. 336-349-2343  Cancer Center Support Programs: @10RELATIVEDAYS@ > Cancer Support Group  2nd Tuesday of the month 1pm-2pm, Journey Room  > Creative Journey  3rd Tuesday of the month 1130am-1pm, Journey Room  > Look Good Feel Better  1st Wednesday of the month 10am-12 noon, Journey Room (Call American Cancer Society to register 1-800-395-5775)   

## 2016-05-05 ENCOUNTER — Other Ambulatory Visit (HOSPITAL_COMMUNITY): Payer: Self-pay

## 2016-05-05 DIAGNOSIS — Z85038 Personal history of other malignant neoplasm of large intestine: Secondary | ICD-10-CM

## 2016-05-05 DIAGNOSIS — D649 Anemia, unspecified: Secondary | ICD-10-CM

## 2016-05-15 ENCOUNTER — Emergency Department (HOSPITAL_COMMUNITY): Payer: Medicare Other

## 2016-05-15 ENCOUNTER — Encounter (HOSPITAL_COMMUNITY): Payer: Self-pay

## 2016-05-15 ENCOUNTER — Other Ambulatory Visit: Payer: Self-pay

## 2016-05-15 ENCOUNTER — Inpatient Hospital Stay (HOSPITAL_COMMUNITY)
Admission: EM | Admit: 2016-05-15 | Discharge: 2016-05-23 | DRG: 389 | Disposition: A | Discharge status: Home / Self Care | Payer: Medicare Other | Attending: Internal Medicine | Admitting: Internal Medicine

## 2016-05-15 DIAGNOSIS — Z8249 Family history of ischemic heart disease and other diseases of the circulatory system: Secondary | ICD-10-CM | POA: Diagnosis not present

## 2016-05-15 DIAGNOSIS — Z7983 Long term (current) use of bisphosphonates: Secondary | ICD-10-CM | POA: Diagnosis not present

## 2016-05-15 DIAGNOSIS — Z903 Acquired absence of stomach [part of]: Secondary | ICD-10-CM | POA: Diagnosis not present

## 2016-05-15 DIAGNOSIS — Z86711 Personal history of pulmonary embolism: Secondary | ICD-10-CM

## 2016-05-15 DIAGNOSIS — Z7901 Long term (current) use of anticoagulants: Secondary | ICD-10-CM

## 2016-05-15 DIAGNOSIS — K2270 Barrett's esophagus without dysplasia: Secondary | ICD-10-CM | POA: Diagnosis present

## 2016-05-15 DIAGNOSIS — M810 Age-related osteoporosis without current pathological fracture: Secondary | ICD-10-CM | POA: Diagnosis present

## 2016-05-15 DIAGNOSIS — Z9221 Personal history of antineoplastic chemotherapy: Secondary | ICD-10-CM

## 2016-05-15 DIAGNOSIS — E785 Hyperlipidemia, unspecified: Secondary | ICD-10-CM | POA: Diagnosis present

## 2016-05-15 DIAGNOSIS — R112 Nausea with vomiting, unspecified: Secondary | ICD-10-CM

## 2016-05-15 DIAGNOSIS — Z978 Presence of other specified devices: Secondary | ICD-10-CM | POA: Diagnosis not present

## 2016-05-15 DIAGNOSIS — G8929 Other chronic pain: Secondary | ICD-10-CM | POA: Diagnosis present

## 2016-05-15 DIAGNOSIS — D509 Iron deficiency anemia, unspecified: Secondary | ICD-10-CM | POA: Diagnosis present

## 2016-05-15 DIAGNOSIS — R569 Unspecified convulsions: Secondary | ICD-10-CM | POA: Diagnosis present

## 2016-05-15 DIAGNOSIS — R109 Unspecified abdominal pain: Secondary | ICD-10-CM | POA: Diagnosis not present

## 2016-05-15 DIAGNOSIS — I10 Essential (primary) hypertension: Secondary | ICD-10-CM | POA: Diagnosis not present

## 2016-05-15 DIAGNOSIS — K56609 Unspecified intestinal obstruction, unspecified as to partial versus complete obstruction: Secondary | ICD-10-CM | POA: Diagnosis not present

## 2016-05-15 DIAGNOSIS — D638 Anemia in other chronic diseases classified elsewhere: Secondary | ICD-10-CM | POA: Diagnosis present

## 2016-05-15 DIAGNOSIS — R140 Abdominal distension (gaseous): Secondary | ICD-10-CM

## 2016-05-15 DIAGNOSIS — K567 Ileus, unspecified: Principal | ICD-10-CM | POA: Diagnosis present

## 2016-05-15 DIAGNOSIS — I5032 Chronic diastolic (congestive) heart failure: Secondary | ICD-10-CM | POA: Diagnosis present

## 2016-05-15 DIAGNOSIS — N183 Chronic kidney disease, stage 3 (moderate): Secondary | ICD-10-CM | POA: Diagnosis not present

## 2016-05-15 DIAGNOSIS — Z85048 Personal history of other malignant neoplasm of rectum, rectosigmoid junction, and anus: Secondary | ICD-10-CM | POA: Diagnosis not present

## 2016-05-15 DIAGNOSIS — G4730 Sleep apnea, unspecified: Secondary | ICD-10-CM | POA: Diagnosis present

## 2016-05-15 DIAGNOSIS — Z87891 Personal history of nicotine dependence: Secondary | ICD-10-CM | POA: Diagnosis not present

## 2016-05-15 DIAGNOSIS — I110 Hypertensive heart disease with heart failure: Secondary | ICD-10-CM | POA: Diagnosis present

## 2016-05-15 DIAGNOSIS — Z8719 Personal history of other diseases of the digestive system: Secondary | ICD-10-CM

## 2016-05-15 DIAGNOSIS — Z79899 Other long term (current) drug therapy: Secondary | ICD-10-CM | POA: Diagnosis not present

## 2016-05-15 DIAGNOSIS — K219 Gastro-esophageal reflux disease without esophagitis: Secondary | ICD-10-CM | POA: Diagnosis present

## 2016-05-15 DIAGNOSIS — Z8673 Personal history of transient ischemic attack (TIA), and cerebral infarction without residual deficits: Secondary | ICD-10-CM | POA: Diagnosis not present

## 2016-05-15 LAB — COMPREHENSIVE METABOLIC PANEL
ALK PHOS: 83 U/L (ref 38–126)
ALT: 23 U/L (ref 17–63)
AST: 29 U/L (ref 15–41)
Albumin: 3.8 g/dL (ref 3.5–5.0)
Anion gap: 6 (ref 5–15)
BILIRUBIN TOTAL: 0.5 mg/dL (ref 0.3–1.2)
BUN: 6 mg/dL (ref 6–20)
CALCIUM: 8.5 mg/dL — AB (ref 8.9–10.3)
CO2: 24 mmol/L (ref 22–32)
CREATININE: 1.35 mg/dL — AB (ref 0.61–1.24)
Chloride: 106 mmol/L (ref 101–111)
GFR, EST NON AFRICAN AMERICAN: 54 mL/min — AB (ref >60)
Glucose, Bld: 95 mg/dL (ref 65–99)
Potassium: 5.1 mmol/L (ref 3.5–5.1)
Sodium: 136 mmol/L (ref 135–145)
Total Protein: 6.5 g/dL (ref 6.5–8.1)

## 2016-05-15 LAB — CBC WITH DIFFERENTIAL/PLATELET
BASOS ABS: 0 10*3/uL (ref 0.0–0.1)
Basophils Relative: 0 %
EOS ABS: 0.2 10*3/uL (ref 0.0–0.7)
EOS PCT: 2 %
HCT: 38.3 % — ABNORMAL LOW (ref 39.0–52.0)
Hemoglobin: 12.7 g/dL — ABNORMAL LOW (ref 13.0–17.0)
LYMPHS ABS: 2.5 10*3/uL (ref 0.7–4.0)
Lymphocytes Relative: 29 %
MCH: 27.1 pg (ref 26.0–34.0)
MCHC: 33.2 g/dL (ref 30.0–36.0)
MCV: 81.7 fL (ref 78.0–100.0)
Monocytes Absolute: 0.9 10*3/uL (ref 0.1–1.0)
Monocytes Relative: 11 %
Neutro Abs: 4.8 10*3/uL (ref 1.7–7.7)
Neutrophils Relative %: 58 %
PLATELETS: 194 10*3/uL (ref 150–400)
RBC: 4.69 MIL/uL (ref 4.22–5.81)
RDW: 18.8 % — ABNORMAL HIGH (ref 11.5–15.5)
WBC: 8.4 10*3/uL (ref 4.0–10.5)

## 2016-05-15 LAB — URINALYSIS, ROUTINE W REFLEX MICROSCOPIC
Bilirubin Urine: NEGATIVE
Glucose, UA: NEGATIVE mg/dL
HGB URINE DIPSTICK: NEGATIVE
KETONES UR: NEGATIVE mg/dL
Leukocytes, UA: NEGATIVE
Nitrite: NEGATIVE
PROTEIN: NEGATIVE mg/dL
Specific Gravity, Urine: 1.015 (ref 1.005–1.030)
pH: 5 (ref 5.0–8.0)

## 2016-05-15 LAB — LIPASE, BLOOD: LIPASE: 11 U/L (ref 11–51)

## 2016-05-15 LAB — BRAIN NATRIURETIC PEPTIDE: B NATRIURETIC PEPTIDE 5: 20 pg/mL (ref 0.0–100.0)

## 2016-05-15 LAB — TROPONIN I: Troponin I: <0.03 ng/mL (ref <0.03)

## 2016-05-15 MED ORDER — ENOXAPARIN SODIUM 60 MG/0.6ML ~~LOC~~ SOLN
60.0000 mg | Freq: Every day | SUBCUTANEOUS | Status: DC
  Administered 2016-05-15 – 2016-05-22 (×8): 60 mg via SUBCUTANEOUS
  Filled 2016-05-15 (×8): qty 0.6

## 2016-05-15 MED ORDER — ONDANSETRON HCL 4 MG PO TABS
4.0000 mg | ORAL_TABLET | Freq: Four times a day (QID) | ORAL | Status: DC | PRN
  Administered 2016-05-21 – 2016-05-23 (×3): 4 mg via ORAL
  Filled 2016-05-15 (×4): qty 1

## 2016-05-15 MED ORDER — LEVETIRACETAM IN NACL 750 MG/50ML IV SOLN
750.0000 mg | Freq: Two times a day (BID) | INTRAVENOUS | Status: DC
  Administered 2016-05-15 – 2016-05-22 (×15): 750 mg via INTRAVENOUS
  Filled 2016-05-15 (×22): qty 50

## 2016-05-15 MED ORDER — LORAZEPAM 2 MG/ML IJ SOLN: 0.5000 mg | Freq: Four times a day (QID) | INTRAMUSCULAR | Status: DC | PRN

## 2016-05-15 MED ORDER — FENTANYL CITRATE (PF) 100 MCG/2ML IJ SOLN
50.0000 ug | INTRAMUSCULAR | Status: DC | PRN
  Administered 2016-05-15 – 2016-05-17 (×20): 50 ug via INTRAVENOUS
  Filled 2016-05-15 (×20): qty 2

## 2016-05-15 MED ORDER — ONDANSETRON HCL 4 MG/2ML IJ SOLN
4.0000 mg | Freq: Once | INTRAMUSCULAR | Status: AC
  Administered 2016-05-15: 4 mg via INTRAVENOUS
  Filled 2016-05-15: qty 2

## 2016-05-15 MED ORDER — ONDANSETRON HCL 4 MG/2ML IJ SOLN
4.0000 mg | Freq: Four times a day (QID) | INTRAMUSCULAR | Status: DC | PRN
  Administered 2016-05-15 – 2016-05-22 (×21): 4 mg via INTRAVENOUS
  Filled 2016-05-15 (×21): qty 2

## 2016-05-15 MED ORDER — ALBUTEROL SULFATE (2.5 MG/3ML) 0.083% IN NEBU: 2.5000 mg | INHALATION_SOLUTION | Freq: Four times a day (QID) | RESPIRATORY_TRACT | Status: DC | PRN

## 2016-05-15 MED ORDER — BISACODYL 10 MG RE SUPP: 10.0000 mg | Freq: Every day | RECTAL | Status: DC | PRN

## 2016-05-15 MED ORDER — SODIUM CHLORIDE 0.9 % IV BOLUS (SEPSIS)
1000.0000 mL | Freq: Once | INTRAVENOUS | Status: AC
  Administered 2016-05-15: 1000 mL via INTRAVENOUS

## 2016-05-15 MED ORDER — FENTANYL CITRATE (PF) 100 MCG/2ML IJ SOLN
50.0000 ug | Freq: Once | INTRAMUSCULAR | Status: AC
  Administered 2016-05-15: 50 ug via INTRAVENOUS
  Filled 2016-05-15: qty 2

## 2016-05-15 MED ORDER — SODIUM CHLORIDE 0.9 % IV BOLUS (SEPSIS)
500.0000 mL | Freq: Once | INTRAVENOUS | Status: AC
  Administered 2016-05-15: 500 mL via INTRAVENOUS

## 2016-05-15 MED ORDER — SODIUM CHLORIDE 0.9 % IV SOLN
INTRAVENOUS | Status: AC
  Administered 2016-05-15: 11:00:00 via INTRAVENOUS

## 2016-05-15 MED ORDER — ALBUTEROL SULFATE HFA 108 (90 BASE) MCG/ACT IN AERS: 1.0000 | INHALATION_SPRAY | Freq: Four times a day (QID) | RESPIRATORY_TRACT | Status: DC | PRN

## 2016-05-15 MED ORDER — FLEET ENEMA 7-19 GM/118ML RE ENEM: 1.0000 | ENEMA | Freq: Once | RECTAL | Status: DC | PRN

## 2016-05-15 NOTE — H&P (Signed)
History and Physical    [name] YNW:[id number] DOB: [birth year] DOA: 05/15/2016  PCP: [name], MD  Patient coming from: Home  Chief Complaint: Abdominal pain  HPI: [name] is a 66 y.o. 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  Patient reports that for the last week and half he has had diarrhea 6-8 times a day.  Then a few days ago he started having pain in his abdomen which progressively worsened.  His appetite decreased and two days prior to admission pain increased.  Pain was mostly constant and initially better with laying down and worsened with sitting up.  Day prior to admission patient was nauseous and vomited a few times. No blood in emesis or diarrhea.  Patient states last SBO was in November of 2017 and that this feels similar to his episodes of SBO. No fevers or chills, patient does say he has had shortness of breath due to pain from breathing in his abdomen.  He says he occasionally has chest pain.  He reports 6 months of waxing and waning occasional tingling and numbness in his hands.  ED Course: patient seen and evaluated.  CT scan showing possible ileus but no definitive obstruction.  Patient failed PO challenge.   Review of Systems: As per HPI otherwise 10 point review of systems negative.    Past Medical History:  Diagnosis Date  . 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 Anemia   . Anxiety   . Arthritis   . Barrett's esophagus   . Blood transfusion   . Bowel obstruction 05/13/2012   Recurrent  . Bronchitis   . Chest pain at rest   . Chronic abdominal pain   . Erosive esophagitis   . ETOH abuse     quit 03/2010  . GERD (gastroesophageal reflux disease)   . Hx of Clostridium difficile infection 01/2012  . Hypertension   . Ileus ([place])   . Iron deficiency anemia 03/23/2016  . Obstruction of bowel 03/03/14  . Osteoporosis   . Personal history of PE (pulmonary embolism) 10/01/2010  . Pneumonia   . Pulmonary embolism ([place]) 02/2010  . Recurrent upper respiratory infection (URI)   . S/P endoscopy September 28, 2010   erosive reflux esophagitis, Billroth I anatomy  . 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 Seizures ([place])   . Shortness of breath   . Sleep apnea   . TIA (transient ischemic attack) 10/11  . Vitamin B12 deficiency     Past Surgical History:  Procedure Laterality Date  . ABDOMINAL ADHESION SURGERY  03/04/15   @ [facility]  . ABDOMINAL EXPLORATION SURGERY    . abdominal sugery     for bowel obstruction x 8, all in 1980s, except for one in 07/2010  . APPENDECTOMY  1980s  . Billroth 1 hemigastrectomy  1980s   per patient for benign duodenal tumor  . CARDIAC CATHETERIZATION  07/17/2012  . CHOLECYSTECTOMY  1980s  . COLON SURGERY  May 2012   left hemicolectomy, colon cancer found at time of surgery for bowel obstruction  . COLONOSCOPY  03/18/2011   anastomosis at 35cm. Several adenomatous polyps removed. Sigmoid diverticulosis.  Next TCS 02/2013  . COLONOSCOPY N/A 07/24/2012   [facility]:NOBSJG post segmental resection with normal-appearing colonic anastomosis aside from an adjacent polyp-removed as described above. Rectal polyp-removed as described above. CT findings appear to have been artifactual. tubular adenomas/prolapsed type polyp.  . COLONOSCOPY N/A 05/15/2015   Procedure: COLONOSCOPY;  Surgeon: [name], MD;  [facility];  Service: Endoscopy;  Laterality: N/A;  . ESOPHAGOGASTRODUODENOSCOPY  09/28/2010  . ESOPHAGOGASTRODUODENOSCOPY  12/01/2010   Cervical web status post dilation, erosive esophagitis, B1 hemigastrectomy, inflamed anastomosis  . ESOPHAGOGASTRODUODENOSCOPY   04/16/2011   excoriation at GEJ c/w trauma/M-W tear, friable gastric anastomosis, dilation efferent limb  . ESOPHAGOGASTRODUODENOSCOPY N/A 06/03/2014   Dr.[name]- cervcal esopphageal web s/p dilation. abnormal distal esophagus bx= barretts esophagus  . ESOPHAGOGASTRODUODENOSCOPY N/A 05/15/2015   Procedure: ESOPHAGOGASTRODUODENOSCOPY (EGD);  Surgeon: [name], MD;  [facility]: [facility];  Service: Endoscopy;  Laterality: N/A;  230  . ESOPHAGOGASTRODUODENOSCOPY (EGD) WITH ESOPHAGEAL DILATION  02/25/2012   [facility]:MOQHUTML esophageal web-s/p dilation anddisruption as described above. Status post prior gastric with Billroth I configuration. Abnormal gastric mucosa at the anastomosis. Gastric biopsy showed mild chronic inflammation but no H. pylori   . HERNIA REPAIR     right inguinal  . MALONEY DILATION N/A 06/03/2014   Procedure: [name] DILATION;  Surgeon: [name], MD;  [facility];  Service: Endoscopy;  Laterality: N/A;  [name] DILATION N/A 05/15/2015   Procedure: [name] DILATION;  Surgeon: [name], MD;  [facility];  Service: Endoscopy;  Laterality: N/A;  . PORTACATH PLACEMENT    . SAVORY DILATION N/A 06/03/2014   Procedure: SAVORY DILATION;  Surgeon: [name], MD;  [facility];  Service: Endoscopy;  Laterality: N/A;     reports that he quit smoking about 3 years ago. His smoking use included Cigarettes. He has a 20.00 pack-year smoking history. He has never used smokeless tobacco. He reports that he does not drink alcohol or use drugs.  No Known Allergies  Family History  Problem Relation Age of Onset  . Hypertension Mother   . Arthritis Mother   . Pneumonia Mother   . Hypertension Father   . Heart attack Father      Prior to Admission medications   Medication Sig Start Date End Date Taking? Authorizing Provider  acetaminophen (TYLENOL) 500 MG tablet Take 1,000 mg by mouth every 6 (six) hours as needed for mild pain, moderate  pain or headache.   Yes Historical Provider, MD  acidophilus (RISAQUAD) CAPS capsule Take 1 capsule by mouth daily.   Yes Historical Provider, MD  albuterol (PROAIR HFA) 108 (90 BASE) MCG/ACT inhaler Inhale 1-2 puffs into the lungs every 6 (six) hours as needed for wheezing or shortness of breath.    Yes Historical Provider, MD  atorvastatin (LIPITOR) 20 MG tablet Take 20 mg by mouth at bedtime.    Yes Historical Provider, MD  carvedilol (COREG) 6.25 MG tablet Take 6.25 mg by mouth 2 (two) times daily with a meal.   Yes Historical Provider, MD  cholecalciferol (VITAMIN D) 1000 UNITS tablet Take 1,000 Units by mouth daily.    Yes Historical Provider, MD  Copper Gluconate 2 MG CAPS Take 1 capsule by mouth daily. 05/03/16  Yes Manon Hilding Kefalas, PA-C  cyanocobalamin (,VITAMIN B-12,) 1000 MCG/ML injection Inject 1,000 mcg into the muscle every 30 (thirty) days.   Yes Historical Provider, MD  dexlansoprazole (DEXILANT) 60 MG capsule Take 60 mg  by mouth daily.   Yes Historical Provider, MD  docusate sodium (COLACE) 100 MG capsule Take 100 mg by mouth 2 (two) times daily.   Yes Historical Provider, MD  enoxaparin (LOVENOX) 60 MG/0.6ML injection Inject 60 mg into the skin at bedtime.    Yes Historical Provider, MD  ferrous sulfate 325 (65 FE) MG tablet Take 1 tablet (325 mg total) by mouth 2 (two) times daily with a meal. 03/21/16  Yes Thurnell Lose, MD  levETIRAcetam (KEPPRA) 750 MG tablet Take 750 mg by mouth 2 (two) times daily.   Yes Historical Provider, MD  LORazepam (ATIVAN) 1 MG tablet Take 1 mg by mouth every 4 (four) hours as needed for anxiety.    Yes Historical Provider, MD  magnesium oxide (MAGNESIUM-OXIDE) 400 (241.3 Mg) MG tablet Take 400 mg by mouth 2 (two) times daily.   Yes Historical Provider, MD  Multiple Vitamin (MULTIVITAMIN WITH MINERALS) TABS tablet Take 1 tablet by mouth daily.   Yes Historical Provider, MD  polyethylene glycol (MIRALAX / GLYCOLAX) packet Take 17 g by mouth daily as  needed for mild constipation or moderate constipation.   Yes Historical Provider, MD  prochlorperazine (COMPAZINE) 10 MG tablet Take 10 mg by mouth every 6 (six) hours as needed for nausea or vomiting.    Yes Historical Provider, MD  promethazine (PHENERGAN) 25 MG tablet Take 1 tablet (25 mg total) by mouth every 6 (six) hours as needed for nausea. 07/07/15  Yes Davonna Belling, MD  tamsulosin (FLOMAX) 0.4 MG CAPS capsule Take 0.4 mg by mouth at bedtime.    Yes Historical Provider, MD  Vitamin D, Ergocalciferol, (DRISDOL) 50000 units CAPS capsule Take 50,000 Units by mouth every Sunday.   Yes Historical Provider, MD  zoledronic acid (RECLAST) 5 MG/100ML SOLN injection Inject 5 mg into the vein See admin instructions. Pt gets a dose yearly.   Yes Historical Provider, MD  lidocaine-prilocaine (EMLA) cream Apply 1 application topically as needed (prior to accessing port).    Historical Provider, MD  Nutritional Supplements (ENSURE PLUS HN) LIQD Take 1 Bottle by mouth every other day.    Historical Provider, MD  ondansetron (ZOFRAN) 4 MG tablet Take 1 tablet (4 mg total) by mouth every 8 (eight) hours as needed for nausea. 02/22/14   Aviva Signs, MD    Physical Exam: Vitals:   05/15/16 0530 05/15/16 0600 05/15/16 0630 05/15/16 0653  BP: 130/88 140/85 147/98 (!) 145/81  Pulse: 75 73 73 64  Resp:      Temp:    97.7 F (36.5 C)  TempSrc:    Oral  SpO2: 94% 95% 96% 98%  Weight:      Height:    '5\' 9"'$  (1.753 m)      Constitutional: NAD, calm, comfortable Vitals:   05/15/16 0530 05/15/16 0600 05/15/16 0630 05/15/16 0653  BP: 130/88 140/85 147/98 (!) 145/81  Pulse: 75 73 73 64  Resp:      Temp:    97.7 F (36.5 C)  TempSrc:    Oral  SpO2: 94% 95% 96% 98%  Weight:      Height:    '5\' 9"'$  (1.753 m)   Eyes: PERRL, lids and conjunctivae normal ENMT: Mucous membranes are moist. Posterior pharynx clear of any exudate or lesions.Normal dentition.  Neck: normal, supple, no masses, no  thyromegaly Respiratory: clear to auscultation bilaterally, no wheezing, no crackles. Normal respiratory effort. No accessory muscle use.  Cardiovascular: Regular rate and rhythm, no murmurs / rubs /  gallops. No extremity edema. 2+ pedal pulses. No carotid bruits.  Abdomen: numerous scars seen on abdomen, tenderness in left upper quadrant, no masses palpated. No hepatosplenomegaly. Bowel sounds positive.  Musculoskeletal: no clubbing / cyanosis. No joint deformity upper and lower extremities. Good ROM, no contractures. Normal muscle tone.  Skin: no rashes, lesions, ulcers. No induration Neurologic: CN 2-12 grossly intact. Sensation intact, DTR normal. Strength 5/5 in all 4.  Psychiatric: Normal judgment and insight. Alert and oriented x 3. Normal mood.     Labs on Admission: I have personally reviewed following labs and imaging studies  CBC:  Recent Labs Lab 05/15/16 0207  WBC 8.4  NEUTROABS 4.8  HGB 12.7*  HCT 38.3*  MCV 81.7  PLT 580   Basic Metabolic Panel:  Recent Labs Lab 05/15/16 0207  NA 136  K 5.1  CL 106  CO2 24  GLUCOSE 95  BUN 6  CREATININE 1.35*  CALCIUM 8.5*   GFR: Estimated Creatinine Clearance: 54.6 mL/min (by C-G formula based on SCr of 1.35 mg/dL (H)). Liver Function Tests:  Recent Labs Lab 05/15/16 0207  AST 29  ALT 23  ALKPHOS 83  BILITOT 0.5  PROT 6.5  ALBUMIN 3.8    Recent Labs Lab 05/15/16 0207  LIPASE 11   No results for input(s): AMMONIA in the last 168 hours. Coagulation Profile: No results for input(s): INR, PROTIME in the last 168 hours. Cardiac Enzymes:  Recent Labs Lab 05/15/16 0207  TROPONINI <0.03   BNP (last 3 results) No results for input(s): PROBNP in the last 8760 hours. HbA1C: No results for input(s): HGBA1C in the last 72 hours. CBG: No results for input(s): GLUCAP in the last 168 hours. Lipid Profile: No results for input(s): CHOL, HDL, LDLCALC, TRIG, CHOLHDL, LDLDIRECT in the last 72 hours. Thyroid  Function Tests: No results for input(s): TSH, T4TOTAL, FREET4, T3FREE, THYROIDAB in the last 72 hours. Anemia Panel: No results for input(s): VITAMINB12, FOLATE, FERRITIN, TIBC, IRON, RETICCTPCT in the last 72 hours. Urine analysis:    Component Value Date/Time   COLORURINE YELLOW 05/15/2016 0310   APPEARANCEUR CLEAR 05/15/2016 0310   LABSPEC 1.015 05/15/2016 0310   PHURINE 5.0 05/15/2016 0310   GLUCOSEU NEGATIVE 05/15/2016 0310   HGBUR NEGATIVE 05/15/2016 0310   BILIRUBINUR NEGATIVE 05/15/2016 0310   KETONESUR NEGATIVE 05/15/2016 0310   PROTEINUR NEGATIVE 05/15/2016 0310   UROBILINOGEN 0.2 11/30/2014 1115   NITRITE NEGATIVE 05/15/2016 0310   LEUKOCYTESUR NEGATIVE 05/15/2016 0310   Sepsis Labs: !!!!!!!!!!!!!!!!!!!!!!!!!!!!!!!!!!!!!!!!!!!! '@LABRCNTIP'$ (procalcitonin:4,lacticidven:4) )No results found for this or any previous visit (from the past 240 hour(s)).   Radiological Exams on Admission: Dg Chest 2 View  Result Date: 05/15/2016 CLINICAL DATA:  Initial evaluation for shortness of breath for 1 month with left upper abdominal pain for 2 days. EXAM: CHEST  2 VIEW COMPARISON:  Prior radiograph 03/19/2016. FINDINGS: Left-sided Port-A-Cath in place, stable. Cardiac and mediastinal silhouettes are stable in size and contour, and remain within normal limits. Aortic atherosclerosis noted. Surgical clip overlies the left hilum. Lungs normally inflated. Linear opacity within the left perihilar region most compatible with atelectasis and/ or scar. No focal infiltrates. No pulmonary edema or pleural effusion. No pneumothorax. No acute osseous abnormality. IMPRESSION: 1. Left perihilar atelectasis and/or scar. No other active cardiopulmonary disease identified. 2. Aortic atherosclerosis. Electronically Signed   By: Jeannine Boga M.D.   On: 05/15/2016 02:57   Dg Abd 2 Views  Result Date: 05/15/2016 CLINICAL DATA:  Initial evaluation for acute left upper abdominal  pain for 2 days. EXAM:  ABDOMEN - 2 VIEW COMPARISON:  Prior CT from 03/19/2016. FINDINGS: Residual colon is gas-filled and mildly prominent throughout the abdomen, relatively similar to previous CT, and suspected to be chronic. No abnormal bowel wall thickening. No small bowel dilatation to suggest small bowel obstruction. Scattered surgical clips and suture material present within the upper and left abdomen. No soft tissue mass or abnormal calcification. Visualized osseous structures demonstrate no acute abnormality. IMPRESSION: 1. Mild gaseous distention and prominence of the residual colon, similar relative to prior CT from 03/19/2016, and suspected to be chronic. No radiographic evidence for small bowel obstruction. 2. No other acute abnormality identified within the abdomen. Electronically Signed   By: Jeannine Boga M.D.   On: 05/15/2016 03:00    EKG: Independently reviewed. Sinus rhythm; Abnormal R-wave progression, early transition  Assessment/Plan Principal Problem:   Ileus ([facility]) Active Problems:   Anemia, chronic disease   Esophageal reflux disease   Chronic abdominal pain   Chronic diastolic heart failure ([facility])     Ileus - no obstruction seen on CT - patient failed PO challenge - still with abdominal pain - IV fentanyl - will reattempt PO challenge if pain better controlled tomorrow - active bowel sounds on exam  Anemia of chronic disease - H/H stable at admission  GERD - can give IV protonix when starting clears  Chronic diastolic heart failure - patient does not take medications for this - will monitor fluid status and IVF - expiration written for IVF   DVT prophylaxis: Heparin Code Status: Full code Family Communication: No family bedside  Disposition Plan: expect patient to discharge when abdominal pain has resolved and tolerating PO Consults called: None Admission status: Inpatient, med- surg   Loretha Stapler MD [facility] Pager [phone]  If 7PM-7AM, please  contact night-coverage [email or web address] Password [facility]  05/15/2016, 7:33 AM

## 2016-05-15 NOTE — ED Provider Notes (Signed)
[facility] DEPT Provider Note   CSN: [id number] Arrival date & time: 05/15/16  0044  Time seen 01:37 AM   History   Chief Complaint Chief Complaint  Patient presents with  . Abdominal Pain    HPI [name] is a 66 y.o. male.  HPI  age and states he started having abdominal pain on February 22. He states the pain has been constant but waxes and wanes. He states taking a big deep breath or eating any type of food or even drinking makes the pain worse. He states laying down would make the pain better but not today. He states his abdomen feels bloated and he describes the pain is sharp. Pain does not radiate and he states it's mainly in the left upper quadrant. He has had nausea and vomiting about twice a day. He states he started having diarrhea 10 days ago and has had 6-8 episodes a day but none today. He denies any blood in his diarrhea. He denies fever but does describe decreased urinary output. He states he's had this pain before when he had a bowel obstruction.  Patient also states he has felt short of breath for the past month. He states it is exertional. He denies wheezing but does state he has a dry cough. He denies fever. He denies chest pain.  PCP [name], MD in [place]   Past Medical History:  Diagnosis Date  . 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 Anemia   . Anxiety   . Arthritis   . Barrett's esophagus   . Blood transfusion   . Bowel obstruction 05/13/2012   Recurrent  . Bronchitis   . Chest pain at rest   . Chronic abdominal pain   . Erosive esophagitis   . ETOH abuse    quit 03/2010  . GERD (gastroesophageal reflux disease)   . Hx of Clostridium difficile infection 01/2012  . Hypertension   . Ileus ([place])   . Iron deficiency anemia 03/23/2016  . Obstruction of bowel 03/03/14  . Osteoporosis   . Personal history of PE (pulmonary embolism) 10/01/2010  . Pneumonia   . Pulmonary embolism ([place]) 02/2010  . Recurrent  upper respiratory infection (URI)   . S/P endoscopy September 28, 2010   erosive reflux esophagitis, Billroth I anatomy  . 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 Seizures ([place])   . Shortness of breath   . Sleep apnea   . TIA (transient ischemic attack) 10/11  . Vitamin B12 deficiency     Patient Active Problem List   Diagnosis Date Noted  . Ileus ([place]) 05/15/2016  . Iron deficiency anemia 03/23/2016  . Acute renal failure ([place]) 03/19/2016  . Normocytic anemia 03/19/2016  . Hypotension 03/19/2016  . Anemia   . History of colon cancer, stage III   . Hx of colon cancer, stage III   . History of colonic polyps   . Barrett's esophagus without dysplasia   . Dysphagia   . Essential hypertension 11/30/2014  . Dysphagia, pharyngoesophageal phase   . Mucosal abnormality of esophagus   . SBO (small bowel obstruction) 09/17/2013  . Rectal bleeding 07/11/2012  . Bowel obstruction 07/11/2012  . Abnormal CT scan, colon 07/11/2012  . Esophageal dysphagia 02/22/2012  . H/O Clostridium difficile infection 02/22/2012  . Diarrhea 09/10/2011  . Cellulitis 09/10/2011  . Nausea 06/07/2011  . Abdominal distension 06/07/2011  . HTN (hypertension) 06/07/2011  . Partial small bowel obstruction ([place]) 05/31/2011  . GI  bleed 05/27/2011  . Coagulopathy ([place]) 05/27/2011  . Gastroenteritis 05/27/2011  . Small bowel obstruction ([place]) 05/13/2011  . Left sided abdominal pain 05/13/2011  . Pulmonary embolism ([place]) 01/17/2011  . Chest pain at rest 01/17/2011  . Pneumonia 12/11/2010  . Hematemesis 12/05/2010  . Chronic abdominal pain 12/05/2010  . Pancytopenia due to antineoplastic chemotherapy ([place]) 12/05/2010  . Coffee ground emesis 11/29/2010  . Esophageal reflux disease 11/15/2010  . 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 Personal history of PE (pulmonary embolism) 10/01/2010  . Bronchitis, acute 10/01/2010  . Erosive esophagitis 10/01/2010  . Fever chills 09/29/2010  . Bleeding gastrointestinal 09/26/2010    . Supratherapeutic INR 09/26/2010  . Abdominal pain, other specified site 09/26/2010  . Nausea and vomiting in adult 09/26/2010  . Anemia, chronic disease 09/26/2010  . Adenocarcinoma of colon with mucinous features 09/22/2010    Past Surgical History:  Procedure Laterality Date  . ABDOMINAL ADHESION SURGERY  03/04/15   @ [facility]  . ABDOMINAL EXPLORATION SURGERY    . abdominal sugery     for bowel obstruction x 8, all in 1980s, except for one in 07/2010  . APPENDECTOMY  1980s  . Billroth 1 hemigastrectomy  1980s   per patient for benign duodenal tumor  . CARDIAC CATHETERIZATION  07/17/2012  . CHOLECYSTECTOMY  1980s  . COLON SURGERY  May 2012   left hemicolectomy, colon cancer found at time of surgery for bowel obstruction  . COLONOSCOPY  03/18/2011   anastomosis at 35cm. Several adenomatous polyps removed. Sigmoid diverticulosis. Next TCS 02/2013  . COLONOSCOPY N/A 07/24/2012   [id number] post segmental resection with normal-appearing colonic anastomosis aside from an adjacent polyp-removed as described above. Rectal polyp-removed as described above. CT findings appear to have been artifactual. tubular adenomas/prolapsed type polyp.  . COLONOSCOPY N/A 05/15/2015   Procedure: COLONOSCOPY;  Surgeon: [name], MD;  Location: [facility];  Service: Endoscopy;  Laterality: N/A;  . ESOPHAGOGASTRODUODENOSCOPY  09/28/2010  . ESOPHAGOGASTRODUODENOSCOPY  12/01/2010   Cervical web status post dilation, erosive esophagitis, B1 hemigastrectomy, inflamed anastomosis  . ESOPHAGOGASTRODUODENOSCOPY  04/16/2011   excoriation at GEJ c/w trauma/M-W tear, friable gastric anastomosis, dilation efferent limb  . ESOPHAGOGASTRODUODENOSCOPY N/A 06/03/2014   Dr.[name]- cervcal esopphageal web s/p dilation. abnormal distal esophagus bx= barretts esophagus  . ESOPHAGOGASTRODUODENOSCOPY N/A 05/15/2015   Procedure: ESOPHAGOGASTRODUODENOSCOPY (EGD);  Surgeon: [name], MD;  Location: [facility];  Service:  Endoscopy;  Laterality: N/A;  230  . ESOPHAGOGASTRODUODENOSCOPY (EGD) WITH ESOPHAGEAL DILATION  02/25/2012   WUJ:[id number] esophageal web-s/p dilation anddisruption as described above. Status post prior gastric with Billroth I configuration. Abnormal gastric mucosa at the anastomosis. Gastric biopsy showed mild chronic inflammation but no H. pylori   . HERNIA REPAIR     right inguinal  . MALONEY DILATION N/A 06/03/2014   Procedure: [name] DILATION;  Surgeon: [name], MD;  Location: [facility];  Service: Endoscopy;  Laterality: N/A;  [name] DILATION N/A 05/15/2015   Procedure: [name] DILATION;  Surgeon: [name], MD;  Location: [facility];  Service: Endoscopy;  Laterality: N/A;  . PORTACATH PLACEMENT    . SAVORY DILATION N/A 06/03/2014   Procedure: SAVORY DILATION;  Surgeon: [name], MD;  Location: [facility];  Service: Endoscopy;  Laterality: N/A;       Home Medications    Prior to Admission medications   Medication Sig Start Date End Date Taking? Authorizing Provider  acetaminophen (TYLENOL) 500 MG tablet Take 1,000  mg by mouth every 6 (six) hours as needed for mild pain, moderate pain or headache.   Yes Historical Provider, MD  acidophilus (RISAQUAD) CAPS capsule Take 1 capsule by mouth daily.   Yes Historical Provider, MD  albuterol (PROAIR HFA) 108 (90 BASE) MCG/ACT inhaler Inhale 1-2 puffs into the lungs every 6 (six) hours as needed for wheezing or shortness of breath.    Yes Historical Provider, MD  atorvastatin (LIPITOR) 20 MG tablet Take 20 mg by mouth at bedtime.    Yes Historical Provider, MD  carvedilol (COREG) 6.25 MG tablet Take 6.25 mg by mouth 2 (two) times daily with a meal.   Yes Historical Provider, MD  cholecalciferol (VITAMIN D) 1000 UNITS tablet Take 1,000 Units by mouth daily.    Yes Historical Provider, MD  Copper Gluconate 2 MG CAPS Take 1 capsule by mouth daily. 05/03/16  Yes Manon Hilding Kefalas, PA-C  cyanocobalamin (,VITAMIN B-12,) 1000  MCG/ML injection Inject 1,000 mcg into the muscle every 30 (thirty) days.   Yes Historical Provider, MD  dexlansoprazole (DEXILANT) 60 MG capsule Take 60 mg by mouth daily.   Yes Historical Provider, MD  docusate sodium (COLACE) 100 MG capsule Take 100 mg by mouth 2 (two) times daily.   Yes Historical Provider, MD  enoxaparin (LOVENOX) 60 MG/0.6ML injection Inject 60 mg into the skin at bedtime.    Yes Historical Provider, MD  ferrous sulfate 325 (65 FE) MG tablet Take 1 tablet (325 mg total) by mouth 2 (two) times daily with a meal. 03/21/16  Yes Thurnell Lose, MD  levETIRAcetam (KEPPRA) 750 MG tablet Take 750 mg by mouth 2 (two) times daily.   Yes Historical Provider, MD  LORazepam (ATIVAN) 1 MG tablet Take 1 mg by mouth every 4 (four) hours as needed for anxiety.    Yes Historical Provider, MD  magnesium oxide (MAGNESIUM-OXIDE) 400 (241.3 Mg) MG tablet Take 400 mg by mouth 2 (two) times daily.   Yes Historical Provider, MD  Multiple Vitamin (MULTIVITAMIN WITH MINERALS) TABS tablet Take 1 tablet by mouth daily.   Yes Historical Provider, MD  polyethylene glycol (MIRALAX / GLYCOLAX) packet Take 17 g by mouth daily as needed for mild constipation or moderate constipation.   Yes Historical Provider, MD  prochlorperazine (COMPAZINE) 10 MG tablet Take 10 mg by mouth every 6 (six) hours as needed for nausea or vomiting.    Yes Historical Provider, MD  promethazine (PHENERGAN) 25 MG tablet Take 1 tablet (25 mg total) by mouth every 6 (six) hours as needed for nausea. 07/07/15  Yes Davonna Belling, MD  tamsulosin (FLOMAX) 0.4 MG CAPS capsule Take 0.4 mg by mouth at bedtime.    Yes Historical Provider, MD  Vitamin D, Ergocalciferol, (DRISDOL) 50000 units CAPS capsule Take 50,000 Units by mouth every Sunday.   Yes Historical Provider, MD  zoledronic acid (RECLAST) 5 MG/100ML SOLN injection Inject 5 mg into the vein See admin instructions. Pt gets a dose yearly.   Yes Historical Provider, MD    lidocaine-prilocaine (EMLA) cream Apply 1 application topically as needed (prior to accessing port).    Historical Provider, MD  Nutritional Supplements (ENSURE PLUS HN) LIQD Take 1 Bottle by mouth every other day.    Historical Provider, MD  ondansetron (ZOFRAN) 4 MG tablet Take 1 tablet (4 mg total) by mouth every 8 (eight) hours as needed for nausea. 02/22/14   Aviva Signs, MD    Family History Family History  Problem Relation Age of Onset  .  Hypertension Mother   . Arthritis Mother   . Pneumonia Mother   . Hypertension Father   . Heart attack Father     Social History Social History  Substance Use Topics  . Smoking status: Former Smoker    Packs/day: 0.50    Years: 40.00    Types: Cigarettes    Quit date: 12/20/2012  . Smokeless tobacco: Never Used  . Alcohol use No  lives with spouse   Allergies   Patient has no known allergies.   Review of Systems Review of Systems  All other systems reviewed and are negative.    Physical Exam Updated Vital Signs BP 148/96   Pulse 77   Temp 97.9 F (36.6 C) (Oral)   Resp 15   Ht '5\' 9"'$  (1.753 m)   Wt 160 lb (72.6 kg)   SpO2 97%   BMI 23.63 kg/m   Vital signs normal    Physical Exam  Constitutional: He is oriented to person, place, and time. He appears well-developed and well-nourished.  Non-toxic appearance. He does not appear ill. No distress.  HENT:  Head: Normocephalic and atraumatic.  Right Ear: External ear normal.  Left Ear: External ear normal.  Nose: Nose normal. No mucosal edema or rhinorrhea.  Mouth/Throat: Oropharynx is clear and moist and mucous membranes are normal. No dental abscesses or uvula swelling.  Eyes: Conjunctivae and EOM are normal. Pupils are equal, round, and reactive to light.  Neck: Normal range of motion and full passive range of motion without pain. Neck supple.  Cardiovascular: Normal rate, regular rhythm and normal heart sounds.  Exam reveals no gallop and no friction rub.   No  murmur heard. Pulmonary/Chest: Effort normal and breath sounds normal. No respiratory distress. He has no wheezes. He has no rhonchi. He has no rales. He exhibits no tenderness and no crepitus.  Abdominal: Soft. Normal appearance and bowel sounds are normal. He exhibits distension. There is tenderness. There is no rebound and no guarding.  Well healed midline upper abd scar present  Musculoskeletal: Normal range of motion. He exhibits no edema or tenderness.  Moves all extremities well.   Neurological: He is alert and oriented to person, place, and time. He has normal strength. No cranial nerve deficit.  Skin: Skin is warm, dry and intact. No rash noted. No erythema. No pallor.  Psychiatric: He has a normal mood and affect. His speech is normal and behavior is normal. His mood appears not anxious.  Nursing note and vitals reviewed.    ED Treatments / Results  Labs (all labs ordered are listed, but only abnormal results are displayed) Results for orders placed or performed during the hospital encounter of 05/15/16  Comprehensive metabolic panel  Result Value Ref Range   Sodium 136 135 - 145 mmol/L   Potassium 5.1 3.5 - 5.1 mmol/L   Chloride 106 101 - 111 mmol/L   CO2 24 22 - 32 mmol/L   Glucose, Bld 95 65 - 99 mg/dL   BUN 6 6 - 20 mg/dL   Creatinine, Ser 1.35 (H) 0.61 - 1.24 mg/dL   Calcium 8.5 (L) 8.9 - 10.3 mg/dL   Total Protein 6.5 6.5 - 8.1 g/dL   Albumin 3.8 3.5 - 5.0 g/dL   AST 29 15 - 41 U/L   ALT 23 17 - 63 U/L   Alkaline Phosphatase 83 38 - 126 U/L   Total Bilirubin 0.5 0.3 - 1.2 mg/dL   GFR calc non Af Amer 54 (L) >60  mL/min   GFR calc Af Amer >60 >60 mL/min   Anion gap 6 5 - 15  Lipase, blood  Result Value Ref Range   Lipase 11 11 - 51 U/L  CBC with Differential  Result Value Ref Range   WBC 8.4 4.0 - 10.5 K/uL   RBC 4.69 4.22 - 5.81 MIL/uL   Hemoglobin 12.7 (L) 13.0 - 17.0 g/dL   HCT 38.3 (L) 39.0 - 52.0 %   MCV 81.7 78.0 - 100.0 fL   MCH 27.1 26.0 - 34.0 pg     MCHC 33.2 30.0 - 36.0 g/dL   RDW 18.8 (H) 11.5 - 15.5 %   Platelets 194 150 - 400 K/uL   Neutrophils Relative % 58 %   Neutro Abs 4.8 1.7 - 7.7 K/uL   Lymphocytes Relative 29 %   Lymphs Abs 2.5 0.7 - 4.0 K/uL   Monocytes Relative 11 %   Monocytes Absolute 0.9 0.1 - 1.0 K/uL   Eosinophils Relative 2 %   Eosinophils Absolute 0.2 0.0 - 0.7 K/uL   Basophils Relative 0 %   Basophils Absolute 0.0 0.0 - 0.1 K/uL  Urinalysis, Routine w reflex microscopic  Result Value Ref Range   Color, Urine YELLOW YELLOW   APPearance CLEAR CLEAR   Specific Gravity, Urine 1.015 1.005 - 1.030   pH 5.0 5.0 - 8.0   Glucose, UA NEGATIVE NEGATIVE mg/dL   Hgb urine dipstick NEGATIVE NEGATIVE   Bilirubin Urine NEGATIVE NEGATIVE   Ketones, ur NEGATIVE NEGATIVE mg/dL   Protein, ur NEGATIVE NEGATIVE mg/dL   Nitrite NEGATIVE NEGATIVE   Leukocytes, UA NEGATIVE NEGATIVE  Brain natriuretic peptide  Result Value Ref Range   B Natriuretic Peptide 20.0 0.0 - 100.0 pg/mL  Troponin I  Result Value Ref Range   Troponin I <0.03 <0.03 ng/mL   Laboratory interpretation all normal except mild renal insufficiency    EKG  EKG Interpretation  Date/Time:  Saturday May 15 2016 00:52:29 EST Ventricular Rate:  73 PR Interval:    QRS Duration: 89 QT Interval:  377 QTC Calculation: 416 R Axis:   -6 Text Interpretation:  Sinus rhythm Abnormal R-wave progression, early transition Since last tracing rate slower 19 Mar 2016 Confirmed by Toddrick Sanna  MD-I, Tinley Rought ([id number]) on 05/15/2016 2:28:41 AM       Radiology Dg Chest 2 View  Result Date: 05/15/2016 CLINICAL DATA:  Initial evaluation for shortness of breath for 1 month with left upper abdominal pain for 2 days. EXAM: CHEST  2 VIEW COMPARISON:  Prior radiograph 03/19/2016. FINDINGS: Left-sided Port-A-Cath in place, stable. Cardiac and mediastinal silhouettes are stable in size and contour, and remain within normal limits. Aortic atherosclerosis noted. Surgical clip overlies  the left hilum. Lungs normally inflated. Linear opacity within the left perihilar region most compatible with atelectasis and/ or scar. No focal infiltrates. No pulmonary edema or pleural effusion. No pneumothorax. No acute osseous abnormality. IMPRESSION: 1. Left perihilar atelectasis and/or scar. No other active cardiopulmonary disease identified. 2. Aortic atherosclerosis. Electronically Signed   By: Jeannine Boga M.D.   On: 05/15/2016 02:57   Dg Abd 2 Views  Result Date: 05/15/2016 CLINICAL DATA:  Initial evaluation for acute left upper abdominal pain for 2 days. EXAM: ABDOMEN - 2 VIEW COMPARISON:  Prior CT from 03/19/2016. FINDINGS: Residual colon is gas-filled and mildly prominent throughout the abdomen, relatively similar to previous CT, and suspected to be chronic. No abnormal bowel wall thickening. No small bowel dilatation to suggest small bowel obstruction.  Scattered surgical clips and suture material present within the upper and left abdomen. No soft tissue mass or abnormal calcification. Visualized osseous structures demonstrate no acute abnormality. IMPRESSION: 1. Mild gaseous distention and prominence of the residual colon, similar relative to prior CT from 03/19/2016, and suspected to be chronic. No radiographic evidence for small bowel obstruction. 2. No other acute abnormality identified within the abdomen. Electronically Signed   By: Jeannine Boga M.D.   On: 05/15/2016 03:00    Procedures Procedures (including critical care time)  Medications Ordered in ED Medications  sodium chloride 0.9 % bolus 1,000 mL (0 mLs Intravenous Stopped 05/15/16 0417)  sodium chloride 0.9 % bolus 500 mL (0 mLs Intravenous Stopped 05/15/16 0557)  fentaNYL (SUBLIMAZE) injection 50 mcg (50 mcg Intravenous Given 05/15/16 0219)  ondansetron (ZOFRAN) injection 4 mg (4 mg Intravenous Given 05/15/16 0219)  fentaNYL (SUBLIMAZE) injection 50 mcg (50 mcg Intravenous Given 05/15/16 0624)  ondansetron  (ZOFRAN) injection 4 mg (4 mg Intravenous Given 05/15/16 1950)     Initial Impression / Assessment and Plan / ED Course  I have reviewed the triage vital signs and the nursing notes.  Pertinent labs & imaging results that were available during my care of the patient were reviewed by me and considered in my medical decision making (see chart for details).  Patient was given IV fluids and pain medication. I started out with x-rays, on review of his prior testing he had 2 CT scans of the abdomen in December and one in November. He has had multiple scans.  Recheck at 4:30 AM we discussed his test results. Patient was willing to try oral fluid challenge. He states he feels like his abdomen still has some distention.  Recheck at 6:10 AM patient states after drinking his distention seems a little worse and he got more nauseated. We talked about admission for IV fluids and bowel rest. I did not want to do another CT scan since he does not have any acute abdomen. Patient either has an ileus or mild early small bowel obstruction that is not apparent on x-ray.   6:00  AM Dr. Shanon Brow, hospitalist states to admit to med surgical bed.  Review of patient's prior testing here shows he has had at least 70 abdominal/pelvic CT scans done since 2011  Final Clinical Impressions(s) / ED Diagnoses   Final diagnoses:  Abdominal distention  Nausea and vomiting, intractability of vomiting not specified, unspecified vomiting type  Ileus [facility])    Plan admission  Rolland Porter, MD, Barbette Or, MD 05/15/16 [phone]

## 2016-05-15 NOTE — ED Triage Notes (Signed)
Pt reports pain in left upper abd x 2 days, states he also feels like he can't get a good breath at times and is worse with exertion.

## 2016-05-16 LAB — BASIC METABOLIC PANEL
Anion gap: 6 (ref 5–15)
BUN: 6 mg/dL (ref 6–20)
CHLORIDE: 108 mmol/L (ref 101–111)
CO2: 24 mmol/L (ref 22–32)
CREATININE: 1.36 mg/dL — AB (ref 0.61–1.24)
Calcium: 7.6 mg/dL — ABNORMAL LOW (ref 8.9–10.3)
GFR calc non Af Amer: 53 mL/min — ABNORMAL LOW (ref >60)
Glucose, Bld: 86 mg/dL (ref 65–99)
Potassium: 4.6 mmol/L (ref 3.5–5.1)
Sodium: 138 mmol/L (ref 135–145)

## 2016-05-16 MED ORDER — SODIUM CHLORIDE 0.9 % IV SOLN
INTRAVENOUS | Status: AC
  Administered 2016-05-16: 11:00:00 via INTRAVENOUS

## 2016-05-16 NOTE — Progress Notes (Signed)
PROGRESS NOTE    [name] PIPER  [name]:[id number] DOB: [birth year] DOA: 05/15/2016 PCP: [name], MD    Brief Narrative:  [name] is a 66 y.o. 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  Patient reports that for the last week and half he has had diarrhea 6-8 times a day.  Then a few days ago he started having pain in his abdomen which progressively worsened.  His appetite decreased and two days prior to admission pain increased.  Pain was mostly constant and initially better with laying down and worsened with sitting up.  Day prior to admission patient was nauseous and vomited a few times. No blood in emesis or diarrhea.  Patient states last SBO was in November of 2017 and that this feels similar to his episodes of SBO. No fevers or chills, patient does say he has had shortness of breath due to pain from breathing in his abdomen.  He says he occasionally has chest pain.  He reports 6 months of waxing and waning occasional tingling and numbness in his hands.   Assessment & Plan:   Principal Problem:   Ileus ([place]) Active Problems:   Anemia, chronic disease   Esophageal reflux disease   Chronic abdominal pain   Chronic diastolic heart failure (HCC)   Ileus - no obstruction seen on xray in ED - patient failed PO challenge in ED - still with abdominal pain - IV fentanyl - will reattempt PO challenge when patient passing gas and pain better controlled - active bowel sounds on exam - continue IV zofran  Anemia of chronic disease - H/H stable at admission - repeat CBC in am  GERD - can give IV protonix when starting clears  Chronic diastolic  heart failure - patient does not take medications for this - will monitor fluid status and IVF - restart IVF as patient not starting PO   DVT prophylaxis: Heparin Code Status: Full code Family Communication: No family bedside  Disposition Plan: expect patient to discharge when abdominal pain has resolved and tolerating PO    Consultants:   None  Procedures:   None  Antimicrobials:   None    Subjective: Patient reports his abdominal pain is about the same as yesterday.  Has not passed gas since yesterday am.  Denies any bowel movements since admission.    Objective: Vitals:   05/15/16 0630 05/15/16 0653 05/15/16 1500 05/16/16 0621  BP: 147/98 (!) 145/81 134/82 136/75  Pulse: 73 64 71 75  Resp:   16 18  Temp:  97.7 F (36.5 C) 98.1 F (36.7 C) 98.4 F (36.9 C)  TempSrc:  Oral  Oral  SpO2: 96% 98% 98% 97%  Weight:      Height:  '5\' 9"'$  (1.753 m)      Intake/Output Summary (Last 24 hours) at 05/16/16 0854 Last data filed at 05/16/16 6387  Gross per 24 hour  Intake            137.5 ml  Output             2250 ml  Net          -2112.5 ml   [employer]  05/15/16 0100  Weight: 72.6 kg (160 lb)    Examination:  General exam: Appears calm and comfortable  Respiratory system: Clear to auscultation. Respiratory effort normal. Cardiovascular system: S1 & S2 heard, RRR. No JVD, murmurs, rubs, gallops or clicks. No pedal edema. Gastrointestinal system: Numerous surgical scars on abdomen, Abdomen is nondistended, soft and nontender. Normal bowel sounds heard. Central nervous system: Alert and oriented. No focal neurological deficits. Extremities: Symmetric 5 x 5 power. Skin: No rashes, lesions or ulcers Psychiatry: Judgement and insight appear normal. Mood & affect appropriate.     Data Reviewed: I have personally reviewed following labs and imaging studies  CBC:  Recent Labs Lab 05/15/16 0207  WBC 8.4  NEUTROABS 4.8  HGB 12.7*  HCT 38.3*  MCV 81.7    PLT 732   Basic Metabolic Panel:  Recent Labs Lab 05/15/16 0207 05/16/16 0640  NA 136 138  K 5.1 4.6  CL 106 108  CO2 24 24  GLUCOSE 95 86  BUN 6 6  CREATININE 1.35* 1.36*  CALCIUM 8.5* 7.6*   GFR: Estimated Creatinine Clearance: 54.2 mL/min (by C-G formula based on SCr of 1.36 mg/dL (H)). Liver Function Tests:  Recent Labs Lab 05/15/16 0207  AST 29  ALT 23  ALKPHOS 83  BILITOT 0.5  PROT 6.5  ALBUMIN 3.8    Recent Labs Lab 05/15/16 0207  LIPASE 11   No results for input(s): AMMONIA in the last 168 hours. Coagulation Profile: No results for input(s): INR, PROTIME in the last 168 hours. Cardiac Enzymes:  Recent Labs Lab 05/15/16 0207  TROPONINI <0.03   BNP (last 3 results) No results for input(s): PROBNP in the last 8760 hours. HbA1C: No results for input(s): HGBA1C in the last 72 hours. CBG: No results for input(s): GLUCAP in the last 168 hours. Lipid Profile: No results for input(s): CHOL, HDL, LDLCALC, TRIG, CHOLHDL, LDLDIRECT in the last 72 hours. Thyroid Function Tests: No results for input(s): TSH, T4TOTAL, FREET4, T3FREE, THYROIDAB in the last 72 hours. Anemia Panel: No results for input(s): VITAMINB12, FOLATE, FERRITIN, TIBC, IRON, RETICCTPCT in the last 72 hours. Sepsis Labs: No results for input(s): PROCALCITON, LATICACIDVEN in the last 168 hours.  No results found for this or any previous visit (from the past 240 hour(s)).       Radiology Studies: Dg Chest 2 View  Result Date: 05/15/2016 CLINICAL DATA:  Initial evaluation for shortness of breath for 1 month with left upper abdominal pain for 2 days. EXAM: CHEST  2 VIEW COMPARISON:  Prior radiograph 03/19/2016. FINDINGS: Left-sided Port-A-Cath in place, stable. Cardiac and mediastinal silhouettes are stable in size and contour, and remain within normal limits. Aortic atherosclerosis noted. Surgical clip overlies the left hilum. Lungs normally inflated. Linear opacity within the left  perihilar region most compatible with atelectasis and/ or scar. No focal infiltrates. No pulmonary edema or pleural effusion. No pneumothorax. No acute osseous abnormality. IMPRESSION: 1. Left perihilar atelectasis and/or scar. No other active cardiopulmonary disease identified. 2. Aortic atherosclerosis. Electronically Signed   By: Jeannine Boga M.D.   On: 05/15/2016 02:57   Dg Abd 2 Views  Result Date: 05/15/2016 CLINICAL DATA:  Initial evaluation for acute left upper abdominal pain for 2 days. EXAM: ABDOMEN - 2 VIEW COMPARISON:  Prior CT from 03/19/2016. FINDINGS: Residual colon is gas-filled and mildly prominent throughout the abdomen, relatively similar to previous CT, and suspected to be chronic. No abnormal bowel wall thickening. No small bowel dilatation to suggest small bowel obstruction.  Scattered surgical clips and suture material present within the upper and left abdomen. No soft tissue mass or abnormal calcification. Visualized osseous structures demonstrate no acute abnormality. IMPRESSION: 1. Mild gaseous distention and prominence of the residual colon, similar relative to prior CT from 03/19/2016, and suspected to be chronic. No radiographic evidence for small bowel obstruction. 2. No other acute abnormality identified within the abdomen. Electronically Signed   By: Jeannine Boga M.D.   On: 05/15/2016 03:00        Scheduled Meds: . enoxaparin  60 mg Subcutaneous QHS  . levETIRAcetam  750 mg Intravenous Q12H   Continuous Infusions:   LOS: 1 day    Time spent: 30 minutes    Loretha Stapler, MD [facility] Pager [phone]  If 7PM-7AM, please contact night-coverage [email or web address] Password [facility] 05/16/2016, 8:54 AM

## 2016-05-17 ENCOUNTER — Inpatient Hospital Stay (HOSPITAL_COMMUNITY): Payer: Medicare Other

## 2016-05-17 MED ORDER — FENTANYL CITRATE (PF) 100 MCG/2ML IJ SOLN
75.0000 ug | INTRAMUSCULAR | Status: DC | PRN
  Administered 2016-05-17 – 2016-05-21 (×44): 75 ug via INTRAVENOUS
  Filled 2016-05-17 (×44): qty 2

## 2016-05-17 MED ORDER — FENTANYL CITRATE (PF) 100 MCG/2ML IJ SOLN
50.0000 ug | INTRAMUSCULAR | Status: DC | PRN
  Administered 2016-05-17: 50 ug via INTRAVENOUS
  Filled 2016-05-17: qty 2

## 2016-05-17 MED ORDER — SODIUM CHLORIDE 0.9 % IV SOLN
INTRAVENOUS | Status: AC
  Administered 2016-05-17: 18:00:00 via INTRAVENOUS

## 2016-05-17 NOTE — Progress Notes (Signed)
PROGRESS NOTE    [name]  JSH:[id number] DOB: Dec 06, 1950 DOA: 05/15/2016 PCP: [name], MD    Brief Narrative:  [name] is a 66 y.o. 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  Patient reports that for the last week and half he has had diarrhea 6-8 times a day.  Then a few days ago he started having pain in his abdomen which progressively worsened.  His appetite decreased and two days prior to admission pain increased.  Pain was mostly constant and initially better with laying down and worsened with sitting up.  Day prior to admission patient was nauseous and vomited a few times. No blood in emesis or diarrhea.  Patient states last SBO was in November of 2017 and that this feels similar to his episodes of SBO. No fevers or chills, patient does say he has had shortness of breath due to pain from breathing in his abdomen.  He says he occasionally has chest pain.  He reports 6 months of waxing and waning occasional tingling and numbness in his hands.   Assessment & Plan:   Principal Problem:   Ileus ([place]) Active Problems:   Anemia, chronic disease   Esophageal reflux disease   Chronic abdominal pain   Chronic diastolic heart failure (HCC)   Ileus - no obstruction seen on xray in ED - patient failed PO challenge in ED - still with abdominal pain - IV fentanyl - patient requesting NG Tube - will place order for NG Tube - increase fentanyl to 32mg - active bowel sounds on exam - continue IV zofran  Anemia of chronic disease - H/H stable   GERD - can give IV protonix when starting clears  Chronic diastolic heart failure - patient  does not take medications for this - will monitor fluid status and IVF - IVF at 512mhr   DVT prophylaxis: Heparin Code Status: Full code Family Communication: No family bedside  Disposition Plan: expect patient to discharge when abdominal pain has resolved and tolerating PO    Consultants:   None  Procedures:   None  Antimicrobials:   None    Subjective: Patient laying in bed.  Reports that he is about the same as he was yesterday. Asking if he can get NG tube to help with his ileus.  Asking for increase in her pain medications.  Objective: Vitals:   05/16/16 1442 05/16/16 2134 05/17/16 0600 05/17/16 1513  BP: 131/78 (!) 161/89 122/72 126/71  Pulse: 66 63 63 65  Resp: '18 18 18   '$ Temp: 98.4 F (36.9 C) 98.2 F (36.8 C) 98.5 F (36.9 C) 98.4 F (36.9 C)  TempSrc:  Oral Oral Oral  SpO2: 96% 97% 97% 97%  Weight:      Height:        Intake/Output Summary (Last 24 hours) at 05/17/16 1529 Last data filed at 05/17/16 0600  Gross per 24 hour  Intake              100 ml  Output              500 ml  Net             -  400 ml   [employer] We[employer]   05/15/16 0100  Weight: 72.6 kg (160 lb)    Examination:  General exam: Appears calm and comfortable  Respiratory system: Clear to auscultation. Respiratory effort normal. Cardiovascular system: S1 & S2 heard, RRR. No JVD, murmurs, rubs, gallops or clicks. No pedal edema. Gastrointestinal system: Numerous surgical scars on abdomen, Abdomen is nondistended, soft and nontender. Normal bowel sounds heard. Central nervous system: Alert and oriented. No focal neurological deficits. Extremities: Symmetric 5 x 5 power. Skin: No rashes, lesions or ulcers Psychiatry: Judgement and insight appear normal. Mood & affect appropriate.     Data Reviewed: I have personally reviewed following labs and imaging studies  CBC:  Recent Labs Lab 05/15/16 0207  WBC 8.4  NEUTROABS 4.8  HGB 12.7*  HCT 38.3*  MCV 81.7  PLT 353    Basic Metabolic Panel:  Recent Labs Lab 05/15/16 0207 05/16/16 0640  NA 136 138  K 5.1 4.6  CL 106 108  CO2 24 24  GLUCOSE 95 86  BUN 6 6  CREATININE 1.35* 1.36*  CALCIUM 8.5* 7.6*   GFR: Estimated Creatinine Clearance: 54.2 mL/min (by C-G formula based on SCr of 1.36 mg/dL (H)). Liver Function Tests:  Recent Labs Lab 05/15/16 0207  AST 29  ALT 23  ALKPHOS 83  BILITOT 0.5  PROT 6.5  ALBUMIN 3.8    Recent Labs Lab 05/15/16 0207  LIPASE 11   No results for input(s): AMMONIA in the last 168 hours. Coagulation Profile: No results for input(s): INR, PROTIME in the last 168 hours. Cardiac Enzymes:  Recent Labs Lab 05/15/16 0207  TROPONINI <0.03   BNP (last 3 results) No results for input(s): PROBNP in the last 8760 hours. HbA1C: No results for input(s): HGBA1C in the last 72 hours. CBG: No results for input(s): GLUCAP in the last 168 hours. Lipid Profile: No results for input(s): CHOL, HDL, LDLCALC, TRIG, CHOLHDL, LDLDIRECT in the last 72 hours. Thyroid Function Tests: No results for input(s): TSH, T4TOTAL, FREET4, T3FREE, THYROIDAB in the last 72 hours. Anemia Panel: No results for input(s): VITAMINB12, FOLATE, FERRITIN, TIBC, IRON, RETICCTPCT in the last 72 hours. Sepsis Labs: No results for input(s): PROCALCITON, LATICACIDVEN in the last 168 hours.  No results found for this or any previous visit (from the past 240 hour(s)).       Radiology Studies: No results found.      Scheduled Meds: . enoxaparin  60 mg Subcutaneous QHS  . levETIRAcetam  750 mg Intravenous Q12H   Continuous Infusions:   LOS: 2 days    Time spent: 30 minutes    Loretha Stapler, MD [facility] Pager [phone]  If 7PM-7AM, please contact night-coverage [email or web address] Password [facility] 05/17/2016, 3:29 PM

## 2016-05-18 ENCOUNTER — Ambulatory Visit: Payer: Federal, State, Local not specified - PPO | Admitting: Internal Medicine

## 2016-05-18 ENCOUNTER — Other Ambulatory Visit (HOSPITAL_COMMUNITY): Payer: Self-pay | Admitting: Oncology

## 2016-05-18 LAB — CBC WITH DIFFERENTIAL/PLATELET
Basophils Absolute: 0 10*3/uL (ref 0.0–0.1)
Basophils Relative: 0 %
EOS ABS: 0.1 10*3/uL (ref 0.0–0.7)
Eosinophils Relative: 1 %
HCT: 39.2 % (ref 39.0–52.0)
HEMOGLOBIN: 12.8 g/dL — AB (ref 13.0–17.0)
Lymphocytes Relative: 28 %
Lymphs Abs: 2 10*3/uL (ref 0.7–4.0)
MCH: 27 pg (ref 26.0–34.0)
MCHC: 32.7 g/dL (ref 30.0–36.0)
MCV: 82.7 fL (ref 78.0–100.0)
MONO ABS: 0.7 10*3/uL (ref 0.1–1.0)
MONOS PCT: 10 %
NEUTROS ABS: 4.3 10*3/uL (ref 1.7–7.7)
NEUTROS PCT: 61 %
Platelets: 171 10*3/uL (ref 150–400)
RBC: 4.74 MIL/uL (ref 4.22–5.81)
RDW: 19 % — AB (ref 11.5–15.5)
WBC: 7.1 10*3/uL (ref 4.0–10.5)

## 2016-05-18 LAB — COMPREHENSIVE METABOLIC PANEL
ALBUMIN: 3.5 g/dL (ref 3.5–5.0)
ALK PHOS: 73 U/L (ref 38–126)
ALT: 12 U/L — AB (ref 17–63)
AST: 16 U/L (ref 15–41)
Anion gap: 10 (ref 5–15)
BUN: 9 mg/dL (ref 6–20)
CHLORIDE: 106 mmol/L (ref 101–111)
CO2: 23 mmol/L (ref 22–32)
CREATININE: 1.48 mg/dL — AB (ref 0.61–1.24)
Calcium: 8.1 mg/dL — ABNORMAL LOW (ref 8.9–10.3)
GFR calc non Af Amer: 48 mL/min — ABNORMAL LOW (ref >60)
GFR, EST AFRICAN AMERICAN: 56 mL/min — AB (ref >60)
GLUCOSE: 79 mg/dL (ref 65–99)
Potassium: 4.3 mmol/L (ref 3.5–5.1)
SODIUM: 139 mmol/L (ref 135–145)
Total Bilirubin: 0.9 mg/dL (ref 0.3–1.2)
Total Protein: 6.2 g/dL — ABNORMAL LOW (ref 6.5–8.1)

## 2016-05-18 LAB — LACTIC ACID, PLASMA
Lactic Acid, Venous: 0.7 mmol/L (ref 0.5–1.9)
Lactic Acid, Venous: 0.8 mmol/L (ref 0.5–1.9)

## 2016-05-18 MED ORDER — BISACODYL 10 MG RE SUPP
10.0000 mg | Freq: Two times a day (BID) | RECTAL | Status: DC
  Administered 2016-05-19 – 2016-05-23 (×9): 10 mg via RECTAL
  Filled 2016-05-18 (×9): qty 1

## 2016-05-18 MED ORDER — SODIUM CHLORIDE 0.9 % IV SOLN
INTRAVENOUS | Status: AC
  Administered 2016-05-18 – 2016-05-19 (×2): via INTRAVENOUS

## 2016-05-18 NOTE — Consult Note (Signed)
Reason for Consult: Nausea and vomiting Referring Physician: Dr. Jackqulyn Livings is an 66 y.o. male.  HPI: Patient is a 66 year old white male well known to me with a recurrent history of bowel dysfunction requiring admissions in the past for NG tube decompression and pain control. He has had multiple abdominal surgeries in the past, the latest one done in October 2017 at [facility]. He states the surgery was prolonged and they performed exploratory surgery. He has not had an episode of nausea and vomiting until this time. He initially presented to the hospital and refuses an NG tube. He felt that he was not getting better and thus he placed his own NG tube. He still has mild abdominal pain which comes and goes. He has not had a bowel movement for 5 days. He denies any fever or chills.  Past Medical History:  Diagnosis Date  . 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 Anemia   . Anxiety   . Arthritis   . Barrett's esophagus   . Blood transfusion   . Bowel obstruction 05/13/2012   Recurrent  . Bronchitis   . Chest pain at rest   . Chronic abdominal pain   . Erosive esophagitis   . ETOH abuse    quit 03/2010  . GERD (gastroesophageal reflux disease)   . Hx of Clostridium difficile infection 01/2012  . Hypertension   . Ileus ([place])   . Iron deficiency anemia 03/23/2016  . Obstruction of bowel 03/03/14  . Osteoporosis   . Personal history of PE (pulmonary embolism) 10/01/2010  . Pneumonia   . Pulmonary embolism ([place]) 02/2010  . Recurrent upper respiratory infection (URI)   . S/P endoscopy September 28, 2010   erosive reflux esophagitis, Billroth I anatomy  . 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 Seizures ([place])   . Shortness of breath   . Sleep apnea   . TIA (transient ischemic attack) 10/11  . Vitamin B12 deficiency     Past Surgical History:  Procedure Laterality Date  . ABDOMINAL ADHESION SURGERY  03/04/15   @ [facility]  . ABDOMINAL  EXPLORATION SURGERY    . abdominal sugery     for bowel obstruction x 8, all in 1980s, except for one in 07/2010  . APPENDECTOMY  1980s  . Billroth 1 hemigastrectomy  1980s   per patient for benign duodenal tumor  . CARDIAC CATHETERIZATION  07/17/2012  . CHOLECYSTECTOMY  1980s  . COLON SURGERY  May 2012   left hemicolectomy, colon cancer found at time of surgery for bowel obstruction  . COLONOSCOPY  03/18/2011   anastomosis at 35cm. Several adenomatous polyps removed. Sigmoid diverticulosis. Next TCS 02/2013  . COLONOSCOPY N/A 07/24/2012   FMB:[id number] post segmental resection with normal-appearing colonic anastomosis aside from an adjacent polyp-removed as described above. Rectal polyp-removed as described above. CT findings appear to have been artifactual. tubular adenomas/prolapsed type polyp.  . COLONOSCOPY N/A 05/15/2015   Procedure: COLONOSCOPY;  Surgeon: [name], MD;  [facility];  Service: Endoscopy;  Laterality: N/A;  . ESOPHAGOGASTRODUODENOSCOPY  09/28/2010  . ESOPHAGOGASTRODUODENOSCOPY  12/01/2010   Cervical web status post dilation, erosive esophagitis, B1 hemigastrectomy, inflamed anastomosis  . ESOPHAGOGASTRODUODENOSCOPY  04/16/2011   excoriation at GEJ c/w trauma/M-W tear, friable gastric anastomosis, dilation efferent limb  . ESOPHAGOGASTRODUODENOSCOPY N/A 06/03/2014   Dr.[name]- cervcal esopphageal web s/p dilation. abnormal distal esophagus bx= barretts esophagus  . ESOPHAGOGASTRODUODENOSCOPY N/A 05/15/2015  Procedure: ESOPHAGOGASTRODUODENOSCOPY (EGD);  Surgeon: [name], MD;  [facility];  Service: Endoscopy;  Laterality: N/A;  230  . ESOPHAGOGASTRODUODENOSCOPY (EGD) WITH ESOPHAGEAL DILATION  02/25/2012   QJJ:HERDEYCX esophageal web-s/p dilation anddisruption as described above. Status post prior gastric with Billroth I configuration. Abnormal gastric mucosa at the anastomosis. Gastric biopsy showed mild chronic inflammation but no H. pylori   .  HERNIA REPAIR     right inguinal  . MALONEY DILATION N/A 06/03/2014   Procedure: [name] DILATION;  Surgeon: [name], MD;  [facility];  Service: Endoscopy;  Laterality: N/A;  [name] DILATION N/A 05/15/2015   Procedure: [name] DILATION;  Surgeon: [name], MD;  [facility];  Service: Endoscopy;  Laterality: N/A;  . PORTACATH PLACEMENT    . SAVORY DILATION N/A 06/03/2014   Procedure: SAVORY DILATION;  Surgeon: [name], MD;  [facility];  Service: Endoscopy;  Laterality: N/A;    Family History  Problem Relation Age of Onset  . Hypertension Mother   . Arthritis Mother   . Pneumonia Mother   . Hypertension Father   . Heart attack Father     Social History:  reports that he quit smoking about 3 years ago. His smoking use included Cigarettes. He has a 20.00 pack-year smoking history. He has never used smokeless tobacco. He reports that he does not drink alcohol or use drugs.  Allergies: No Known Allergies  Medications:  Prior to Admission:  Prescriptions Prior to Admission  Medication Sig Dispense Refill Last Dose  . acetaminophen (TYLENOL) 500 MG tablet Take 1,000 mg by mouth every 6 (six) hours as needed for mild pain, moderate pain or headache.   unknown  . acidophilus (RISAQUAD) CAPS capsule Take 1 capsule by mouth daily.   05/14/2016 at Unknown time  . albuterol (PROAIR HFA) 108 (90 BASE) MCG/ACT inhaler Inhale 1-2 puffs into the lungs every 6 (six) hours as needed for wheezing or shortness of breath.    unknown  . atorvastatin (LIPITOR) 20 MG tablet Take 20 mg by mouth at bedtime.    05/14/2016 at Unknown time  . carvedilol (COREG) 6.25 MG tablet Take 6.25 mg by mouth 2 (two) times daily with a meal.   05/14/2016 at 2000  . cholecalciferol (VITAMIN D) 1000 UNITS tablet Take 1,000 Units by mouth daily.    05/14/2016 at Unknown time  . Copper Gluconate 2 MG CAPS Take 1 capsule by mouth daily. 30 capsule 5 05/14/2016 at Unknown time   . cyanocobalamin (,VITAMIN B-12,) 1000 MCG/ML injection Inject 1,000 mcg into the muscle every 30 (thirty) days.   Past Month at Unknown time  . dexlansoprazole (DEXILANT) 60 MG capsule Take 60 mg by mouth daily.   05/14/2016 at Unknown time  . docusate sodium (COLACE) 100 MG capsule Take 100 mg by mouth 2 (two) times daily.   05/14/2016 at Unknown time  . enoxaparin (LOVENOX) 60 MG/0.6ML injection Inject 60 mg into the skin at bedtime.    05/14/2016 at 2000  . ferrous sulfate 325 (65 FE) MG tablet Take 1 tablet (325 mg total) by mouth 2 (two) times daily with a meal. 60 tablet 0 05/14/2016 at Unknown time  . levETIRAcetam (KEPPRA) 750 MG tablet Take 750 mg by mouth 2 (two) times daily.   05/14/2016 at Unknown time  . lidocaine-prilocaine (EMLA) cream Apply 1 application topically as needed (prior to accessing port).   unknown  . LORazepam (ATIVAN) 1  MG tablet Take 1 mg by mouth every 4 (four) hours as needed for anxiety.    unknown at Unknown time  . magnesium oxide (MAGNESIUM-OXIDE) 400 (241.3 Mg) MG tablet Take 400 mg by mouth 2 (two) times daily.   05/14/2016 at Unknown time  . Multiple Vitamin (MULTIVITAMIN WITH MINERALS) TABS tablet Take 1 tablet by mouth daily.   05/14/2016 at Unknown time  . Nutritional Supplements (ENSURE PLUS HN) LIQD Take 1 Bottle by mouth every other day.   05/13/2016 at Unknown time  . ondansetron (ZOFRAN) 4 MG tablet Take 1 tablet (4 mg total) by mouth every 8 (eight) hours as needed for nausea. 20 tablet 1 unknown  . polyethylene glycol (MIRALAX / GLYCOLAX) packet Take 17 g by mouth daily as needed for mild constipation or moderate constipation.   unknown  . prochlorperazine (COMPAZINE) 10 MG tablet Take 10 mg by mouth every 6 (six) hours as needed for nausea or vomiting.    unknown  . promethazine (PHENERGAN) 25 MG tablet Take 1 tablet (25 mg total) by mouth every 6 (six) hours as needed for nausea. 10 tablet 0 unknown  . tamsulosin (FLOMAX) 0.4 MG CAPS capsule Take 0.4 mg  by mouth at bedtime.    05/14/2016 at Unknown time  . Vitamin D, Ergocalciferol, (DRISDOL) 50000 units CAPS capsule Take 50,000 Units by mouth every Sunday.   Past Week at Unknown time  . zoledronic acid (RECLAST) 5 MG/100ML SOLN injection Inject 5 mg into the vein See admin instructions. Pt gets a dose yearly.   unknown   Scheduled: . enoxaparin  60 mg Subcutaneous QHS  . levETIRAcetam  750 mg Intravenous Q12H    Results for orders placed or performed during the hospital encounter of 05/15/16 (from the past 48 hour(s))  Comprehensive metabolic panel     Status: Abnormal   Collection Time: 05/18/16 10:44 AM  Result Value Ref Range   Sodium 139 135 - 145 mmol/L   Potassium 4.3 3.5 - 5.1 mmol/L   Chloride 106 101 - 111 mmol/L   CO2 23 22 - 32 mmol/L   Glucose, Bld 79 65 - 99 mg/dL   BUN 9 6 - 20 mg/dL   Creatinine, Ser 1.48 (H) 0.61 - 1.24 mg/dL   Calcium 8.1 (L) 8.9 - 10.3 mg/dL   Total Protein 6.2 (L) 6.5 - 8.1 g/dL   Albumin 3.5 3.5 - 5.0 g/dL   AST 16 15 - 41 U/L   ALT 12 (L) 17 - 63 U/L   Alkaline Phosphatase 73 38 - 126 U/L   Total Bilirubin 0.9 0.3 - 1.2 mg/dL   GFR calc non Af Amer 48 (L) >60 mL/min   GFR calc Af Amer 56 (L) >60 mL/min    Comment: (NOTE) The eGFR has been calculated using the CKD EPI equation. This calculation has not been validated in all clinical situations. eGFR's persistently <60 mL/min signify possible Chronic Kidney Disease.    Anion gap 10 5 - 15  CBC with Differential/Platelet     Status: Abnormal   Collection Time: 05/18/16 10:44 AM  Result Value Ref Range   WBC 7.1 4.0 - 10.5 K/uL   RBC 4.74 4.22 - 5.81 MIL/uL   Hemoglobin 12.8 (L) 13.0 - 17.0 g/dL   HCT 39.2 39.0 - 52.0 %   MCV 82.7 78.0 - 100.0 fL   MCH 27.0 26.0 - 34.0 pg   MCHC 32.7 30.0 - 36.0 g/dL   RDW 19.0 (H) 11.5 - 15.5 %  Platelets 171 150 - 400 K/uL   Neutrophils Relative % 61 %   Neutro Abs 4.3 1.7 - 7.7 K/uL   Lymphocytes Relative 28 %   Lymphs Abs 2.0 0.7 - 4.0 K/uL    Monocytes Relative 10 %   Monocytes Absolute 0.7 0.1 - 1.0 K/uL   Eosinophils Relative 1 %   Eosinophils Absolute 0.1 0.0 - 0.7 K/uL   Basophils Relative 0 %   Basophils Absolute 0.0 0.0 - 0.1 K/uL  Lactic acid, plasma     Status: None   Collection Time: 05/18/16 12:09 PM  Result Value Ref Range   Lactic Acid, Venous 0.7 0.5 - 1.9 mmol/L  Lactic acid, plasma     Status: None   Collection Time: 05/18/16  2:37 PM  Result Value Ref Range   Lactic Acid, Venous 0.8 0.5 - 1.9 mmol/L    Dg Chest Port 1 View  Result Date: 05/17/2016 CLINICAL DATA:  Nasogastric tube placement. EXAM: PORTABLE CHEST 1 VIEW COMPARISON:  Chest x-ray dated 05/15/2016. FINDINGS: Nasogastric tube appears adequately positioned in the stomach. Left-sided Port-A-Cath is stable in position with tip at the level of the upper SVC. Heart size and mediastinal contours stable. Lungs appear clear. No pleural effusion or pneumothorax seen. IMPRESSION: Nasogastric tube appears adequately positioned in the stomach. Electronically Signed   By: Franki Cabot M.D.   On: 05/17/2016 19:06    ROS:  Pertinent items are noted in HPI.  Blood pressure (!) 141/80, pulse 79, temperature 98.3 F (36.8 C), temperature source Oral, resp. rate 15, height '5\' 9"'  (1.753 m), weight 160 lb (72.6 kg), SpO2 96 %. Physical Exam: Well-developed well-nourished white male in no acute distress. Head is normocephalic, atraumatic. Neck is supple without lymphadenopathy. Lungs clear auscultation with equal breath sounds bilaterally. Heart examination reveals a regular rate and rhythm without S3, S4, murmurs. Abdomen is soft and flat. Minimal bowel sounds are appreciated. Well-healed surgical scars are present.  Assessment/Plan: Impression: History of bowel dysfunction, multiple abdominal surgeries, colon cancer, esophageal reflux disease This episode appears similar to previous episodes. I am not aware of what [facility] found at the time of surgery. He does not  need acute surgical intervention at this time. Would continue NG tube decompression for now. Will follow with you.  Aviva Signs 05/18/2016, 10:20 PM

## 2016-05-18 NOTE — Progress Notes (Signed)
PROGRESS NOTE    [name]  VHQ:[id number] DOB: Jun 26, 1950 DOA: 05/15/2016 PCP: [name], MD    Brief Narrative:  [name] is a 66 y.o. 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  Patient reports that for the last week and half he has had diarrhea 6-8 times a day.  Then a few days ago he started having pain in his abdomen which progressively worsened.  His appetite decreased and two days prior to admission pain increased.  Pain was mostly constant and initially better with laying down and worsened with sitting up.  Day prior to admission patient was nauseous and vomited a few times. No blood in emesis or diarrhea.  Patient states last SBO was in November of 2017 and that this feels similar to his episodes of SBO. No fevers or chills, patient does say he has had shortness of breath due to pain from breathing in his abdomen.  He says he occasionally has chest pain.  He reports 6 months of waxing and waning occasional tingling and numbness in his hands.   Assessment & Plan:   Principal Problem:   Ileus ([place]) Active Problems:   Anemia, chronic disease   Esophageal reflux disease   Chronic abdominal pain   Chronic diastolic heart failure (HCC)   Ileus - no obstruction seen on xray in ED - patient failed PO challenge in ED - still with abdominal pain - IV fentanyl - NGT with significant output - Gen Surg consulted - increase fentanyl to 57mg - active bowel sounds on exam - continue IV zofran  Anemia of chronic disease - H/H stable   GERD - can give IV protonix when starting clears  Chronic diastolic heart failure - patient does not  take medications for this - will monitor fluid status and IVF - IVF at 735mhr   DVT prophylaxis: Heparin Code Status: Full code Family Communication: No family bedside  Disposition Plan: expect patient to discharge when abdominal pain has resolved and tolerating PO    Consultants:   None  Procedures:   None  Antimicrobials:   None    Subjective: Patient laying in bed. States he still is not passing gas and has not had a bowel movement.  Reports that NG has had a good deal of output.  Says pain is well controlled.  Objective: Vitals:   05/17/16 1513 05/17/16 2100 05/18/16 0500 05/18/16 1500  BP: 126/71 (!) 145/90 136/82 132/81  Pulse: 65 85 74 75  Resp:  '18 18 18  '$ Temp: 98.4 F (36.9 C) 98.5 F (36.9 C) 98 F (36.7 C) 98.2 F (36.8 C)  TempSrc: Oral Oral Oral Oral  SpO2: 97% 96% 95% 98%  Weight:      Height:        Intake/Output Summary (Last 24 hours) at 05/18/16 1637 Last data filed at 05/18/16 1500  Gross per 24 hour  Intake           873.75 ml  Output             2675 ml  Net         -1801.25 ml   Filed Weights   05/15/16 0100  Weight: 72.6 kg (160 lb)    Examination:  General exam: Appears calm and comfortable  Respiratory system: Clear to auscultation. Respiratory effort normal. Cardiovascular system: S1 & S2 heard, RRR. No JVD, murmurs, rubs, gallops or clicks. No pedal edema. Gastrointestinal system: Numerous surgical scars on abdomen, Abdomen is nondistended, soft and nontender. Normal bowel sounds heard. Central nervous system: Alert and oriented. No focal neurological deficits. Extremities: Symmetric 5 x 5 power. Skin: No rashes, lesions or ulcers Psychiatry: Judgement and insight appear normal. Mood & affect appropriate.     Data Reviewed: I have personally reviewed following labs and imaging studies  CBC:  Recent Labs Lab 05/15/16 0207 05/18/16 1044  WBC 8.4 7.1  NEUTROABS 4.8 4.3  HGB 12.7* 12.8*  HCT 38.3* 39.2  MCV  81.7 82.7  PLT 194 191   Basic Metabolic Panel:  Recent Labs Lab 05/15/16 0207 05/16/16 0640 05/18/16 1044  NA 136 138 139  K 5.1 4.6 4.3  CL 106 108 106  CO2 '24 24 23  '$ GLUCOSE 95 86 79  BUN '6 6 9  '$ CREATININE 1.35* 1.36* 1.48*  CALCIUM 8.5* 7.6* 8.1*   GFR: Estimated Creatinine Clearance: 49.8 mL/min (by C-G formula based on SCr of 1.48 mg/dL (H)). Liver Function Tests:  Recent Labs Lab 05/15/16 0207 05/18/16 1044  AST 29 16  ALT 23 12*  ALKPHOS 83 73  BILITOT 0.5 0.9  PROT 6.5 6.2*  ALBUMIN 3.8 3.5    Recent Labs Lab 05/15/16 0207  LIPASE 11   No results for input(s): AMMONIA in the last 168 hours. Coagulation Profile: No results for input(s): INR, PROTIME in the last 168 hours. Cardiac Enzymes:  Recent Labs Lab 05/15/16 0207  TROPONINI <0.03   BNP (last 3 results) No results for input(s): PROBNP in the last 8760 hours. HbA1C: No results for input(s): HGBA1C in the last 72 hours. CBG: No results for input(s): GLUCAP in the last 168 hours. Lipid Profile: No results for input(s): CHOL, HDL, LDLCALC, TRIG, CHOLHDL, LDLDIRECT in the last 72 hours. Thyroid Function Tests: No results for input(s): TSH, T4TOTAL, FREET4, T3FREE, THYROIDAB in the last 72 hours. Anemia Panel: No results for input(s): VITAMINB12, FOLATE, FERRITIN, TIBC, IRON, RETICCTPCT in the last 72 hours. Sepsis Labs:  Recent Labs Lab 05/18/16 1209 05/18/16 1437  LATICACIDVEN 0.7 0.8    No results found for this or any previous visit (from the past 240 hour(s)).       Radiology Studies: Dg Chest Port 1 View  Result Date: 05/17/2016 CLINICAL DATA:  Nasogastric tube placement. EXAM: PORTABLE CHEST 1 VIEW COMPARISON:  Chest x-ray dated 05/15/2016. FINDINGS: Nasogastric tube appears adequately positioned in the stomach. Left-sided Port-A-Cath is stable in position with tip at the level of the upper SVC. Heart size and mediastinal contours stable. Lungs appear clear. No pleural  effusion or pneumothorax seen. IMPRESSION: Nasogastric tube appears adequately positioned in the stomach. Electronically Signed   By: Franki Cabot M.D.   On: 05/17/2016 19:06        Scheduled Meds: . enoxaparin  60 mg Subcutaneous QHS  . levETIRAcetam  750 mg Intravenous Q12H   Continuous Infusions: . sodium chloride 75 mL/hr at 05/18/16 1145     LOS: 3 days    Time spent: 25 minutes    Loretha Stapler, MD [facility] Pager [phone]  If 7PM-7AM, please contact night-coverage [email or web address] Password [email or web address] 05/18/2016, 4:37 PM

## 2016-05-19 ENCOUNTER — Inpatient Hospital Stay (HOSPITAL_COMMUNITY): Payer: Medicare Other

## 2016-05-19 LAB — GLUCOSE, CAPILLARY
Glucose-Capillary: 100 mg/dL — ABNORMAL HIGH (ref 65–99)
Glucose-Capillary: 80 mg/dL (ref 65–99)

## 2016-05-19 MED ORDER — DIPHENHYDRAMINE HCL 50 MG/ML IJ SOLN
25.0000 mg | Freq: Three times a day (TID) | INTRAMUSCULAR | Status: DC | PRN
  Administered 2016-05-19 – 2016-05-22 (×7): 25 mg via INTRAVENOUS
  Filled 2016-05-19 (×7): qty 1

## 2016-05-19 MED ORDER — TRACE MINERALS CR-CU-MN-SE-ZN 10-1000-500-60 MCG/ML IV SOLN
INTRAVENOUS | Status: AC
  Administered 2016-05-19: 18:00:00 via INTRAVENOUS
  Filled 2016-05-19: qty 960

## 2016-05-19 MED ORDER — HYDRALAZINE HCL 20 MG/ML IJ SOLN: 5.0000 mg | INTRAMUSCULAR | Status: DC | PRN

## 2016-05-19 MED ORDER — FAT EMULSION 20 % IV EMUL
250.0000 mL | INTRAVENOUS | Status: AC
  Administered 2016-05-19: 250 mL via INTRAVENOUS
  Filled 2016-05-19: qty 250

## 2016-05-19 NOTE — Progress Notes (Signed)
  Subjective: Abdominal pain slightly decreased. No significant bowel movement or flatus yet.  Objective: Vital signs in last 24 hours: Temp:  [98.2 F (36.8 C)-98.4 F (36.9 C)] 98.4 F (36.9 C) (02/28 0554) Pulse Rate:  [75-79] 75 (02/28 0554) Resp:  [15-20] 20 (02/28 0554) BP: (132-143)/(80-81) 143/80 (02/28 0554) SpO2:  [96 %-98 %] 97 % (02/28 0554) Last BM Date: 05/13/16  Intake/Output from previous day: 02/27 0701 - 02/28 0700 In: 643.8 [I.V.:243.8; NG/GT:400] Out: 1100 [Urine:300; Emesis/NG output:800] Intake/Output this shift: No intake/output data recorded.  General appearance: alert, cooperative and no distress GI: Soft, flat. Minimal bowel sounds appreciated. No significant tenderness noted. No significant distention noted.  Lab Results:   Recent Labs  05/18/16 1044  WBC 7.1  HGB 12.8*  HCT 39.2  PLT 171   BMET  Recent Labs  05/18/16 1044  NA 139  K 4.3  CL 106  CO2 23  GLUCOSE 79  BUN 9  CREATININE 1.48*  CALCIUM 8.1*   PT/INR No results for input(s): LABPROT, INR in the last 72 hours.  Studies/Results: Dg Chest Port 1 View  Result Date: 05/17/2016 CLINICAL DATA:  Nasogastric tube placement. EXAM: PORTABLE CHEST 1 VIEW COMPARISON:  Chest x-ray dated 05/15/2016. FINDINGS: Nasogastric tube appears adequately positioned in the stomach. Left-sided Port-A-Cath is stable in position with tip at the level of the upper SVC. Heart size and mediastinal contours stable. Lungs appear clear. No pleural effusion or pneumothorax seen. IMPRESSION: Nasogastric tube appears adequately positioned in the stomach. Electronically Signed   By: Franki Cabot M.D.   On: 05/17/2016 19:06    Anti-infectives: Anti-infectives    None      Assessment/Plan: Impression: Bowel dysfunction, slowly resolving. Patient does continue to need NG tube. He did state that no bowel resection was performed during his surgery at [facility]. We will continue current management.  LOS: 4  days    Aviva Signs 05/19/2016

## 2016-05-19 NOTE — Progress Notes (Signed)
PROGRESS NOTE    [name]  LKG:[id number] DOB: [birth year] DOA: 05/15/2016 PCP: [name], MD    Brief Narrative:  [name] is a 66 y.o. 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 Patient with known history of recurrent SBO/ileus, where he required hospitalization at [facility] status post exploratory laparotomy December 2017, the decision of some lysis, otherwise no acute findings he was discharged on TPN with gradual advancement of his diet.   Assessment & Plan:   Principal Problem:   Ileus ([place]) Active Problems:   Anemia, chronic disease   Esophageal reflux disease   Chronic abdominal pain   Chronic diastolic heart failure (HCC)   Recurrent SBO - Even though no obstruction seen on abdominal x-ray in ED, patient with known history of recurrent SBO, giving multiple abdominal surgeries most recent exploratory laparotomy at [facility] on September 2018 significant for lysis of some adhesions, require prolonged TPN course then. - Still significant NGT suction, continue nothing by mouth, continue with IV fluids, will start on TPN. -  surgery input appreciated, continue with conservative management, bowel rest, monitor and replete electrolytes as needed, continue with when necessary nausea and pain medication.  Iron deficiency anemia - Receiving IV iron and oncology clinic   GERD - can give IV protonix when starting clears  Chronic diastolic heart failure - Appears to be euvolemic currently, continue with IV fluid, monitor fluid status closely after starting TPN  History of PE - Continue with Lovenox, patient reports he has been in current dose since 2011  Hx of colorectal cancer  - Stage III, diagnosed in May 2012 and s/p FOLFOX in 2012  - EGD and colonoscopy reportedly normal in  February 2017  - Ongoing management per oncology    History of hypertension - Home medication on hold given his nothing by mouth, will continue with when necessary hydralazine  History of seizures - Continue with IV Keppra   DVT prophylaxis: Lovenox Code Status: Full code Family Communication: No family bedside  Disposition Plan:  pending further workup    Consultants:   Gen surgery  Procedures:   None  Antimicrobials:   None    Subjective: Patient laying in bed. States he still is not passing gas and has not had a bowel movement.  Reports that NG has had a good deal of output.  Says pain is well controlled.  Objective: Vitals:   05/18/16 1500 05/18/16 2156 05/19/16 0554 05/19/16 1420  BP: 132/81 (!) 141/80 (!) 143/80 138/82  Pulse: 75 79 75 74  Resp: '18 15 20 18  '$ Temp: 98.2 F (36.8 C) 98.3 F (36.8 C) 98.4 F (36.9 C) 98.6 F (37 C)  TempSrc: Oral Oral Oral Oral  SpO2: 98% 96% 97% 98%  Weight:      Height:        Intake/Output Summary (Last 24 hours) at 05/19/16 1538 Last data filed at 05/19/16 0257  Gross per 24 hour  Intake              400 ml  Output              300 ml  Net              100 ml   Filed Weights   05/15/16 0100  Weight: 72.6 kg (160 lb)    Examination:  General exam: Appears calm and comfortable  Respiratory system: Clear to auscultation. Respiratory effort normal. Cardiovascular system: S1 & S2 heard, RRR. No JVD, murmurs, rubs, gallops or clicks. No pedal edema. Gastrointestinal system: Numerous surgical scars on abdomen, Abdomen is nondistended, soft and nontender. Normal bowel sounds heard. Central nervous system: Alert and oriented. No focal neurological deficits. Extremities: Symmetric 5 x 5 power. Skin: No rashes, lesions or ulcers Psychiatry: Judgement and insight appear normal. Mood & affect appropriate.     Data Reviewed: I have personally reviewed following labs and imaging studies  CBC:  Recent Labs Lab  05/15/16 0207 05/18/16 1044  WBC 8.4 7.1  NEUTROABS 4.8 4.3  HGB 12.7* 12.8*  HCT 38.3* 39.2  MCV 81.7 82.7  PLT 194 638   Basic Metabolic Panel:  Recent Labs Lab 05/15/16 0207 05/16/16 0640 05/18/16 1044  NA 136 138 139  K 5.1 4.6 4.3  CL 106 108 106  CO2 '24 24 23  '$ GLUCOSE 95 86 79  BUN '6 6 9  '$ CREATININE 1.35* 1.36* 1.48*  CALCIUM 8.5* 7.6* 8.1*   GFR: Estimated Creatinine Clearance: 49.8 mL/min (by C-G formula based on SCr of 1.48 mg/dL (H)). Liver Function Tests:  Recent Labs Lab 05/15/16 0207 05/18/16 1044  AST 29 16  ALT 23 12*  ALKPHOS 83 73  BILITOT 0.5 0.9  PROT 6.5 6.2*  ALBUMIN 3.8 3.5    Recent Labs Lab 05/15/16 0207  LIPASE 11   No results for input(s): AMMONIA in the last 168 hours. Coagulation Profile: No results for input(s): INR, PROTIME in the last 168 hours. Cardiac Enzymes:  Recent Labs Lab 05/15/16 0207  TROPONINI <0.03   BNP (last 3 results) No results for input(s): PROBNP in the last 8760 hours. HbA1C: No results for input(s): HGBA1C in the last 72 hours. CBG: No results for input(s): GLUCAP in the last 168 hours. Lipid Profile: No results for input(s): CHOL, HDL, LDLCALC, TRIG, CHOLHDL, LDLDIRECT in the last 72 hours. Thyroid Function Tests: No results for input(s): TSH, T4TOTAL, FREET4, T3FREE, THYROIDAB in the last 72 hours. Anemia Panel: No results for input(s): VITAMINB12, FOLATE, FERRITIN, TIBC, IRON, RETICCTPCT in the last 72 hours. Sepsis Labs:  Recent Labs Lab 05/18/16 1209 05/18/16 1437  LATICACIDVEN 0.7 0.8    No results found for this or any previous visit (from the past 240 hour(s)).       Radiology Studies: Dg Chest Port 1 View  Result Date: 05/17/2016 CLINICAL DATA:  Nasogastric tube placement. EXAM: PORTABLE CHEST 1 VIEW COMPARISON:  Chest x-ray dated 05/15/2016. FINDINGS: Nasogastric tube appears adequately positioned in the stomach. Left-sided Port-A-Cath is stable in position with tip at  the level of the upper SVC. Heart size and mediastinal contours stable. Lungs appear clear. No pleural effusion or pneumothorax seen. IMPRESSION: Nasogastric tube appears adequately positioned in the stomach. Electronically Signed   By: Franki Cabot M.D.   On: 05/17/2016 19:06        Scheduled Meds: . bisacodyl  10 mg Rectal BID  . enoxaparin  60 mg Subcutaneous QHS  . levETIRAcetam  750 mg Intravenous Q12H   Continuous Infusions:    LOS: 4 days     Phillips Climes, MD [facility] Pager [phone]  If 7PM-7AM, please contact night-coverage [email or web address] Password [facility] 05/19/2016, 3:38 PM

## 2016-05-19 NOTE — Progress Notes (Signed)
PHARMACY - ADULT TOTAL PARENTERAL NUTRITION CONSULT NOTE   Pharmacy Consult for TPN Indication: bowel dysfunction with NG tube placement for decompression and pain control  Patient Measurements: Height: '5\' 9"'$  (175.3 cm) Weight: 160 lb (72.6 kg) IBW/kg (Calculated) : 70.7 TPN AdjBW (KG): 72.6 Body mass index is 23.63 kg/m.   Assessment:  66 yo man with long h/o bowel dysfunction to start TPN.  He currently has a NG tube in place for decompression and pain control.  GI: No BM for 5 days Endo: Has no h/o DM, not on insulin Lytes: K 4.3, SrCr 1.48 Renal: SrCr elevated, UOP adequate.   Pulm: Cards:  Hepatobil: Neuro: ID:  TPN Access: has PAC placed Nov 2017 TPN start date: 2/28  Nutritional Goals (pending RD recommendation ): kCal: Protein:   Current Nutrition: NPO  Plan:  Start Clinimix E 5/15 at 40 ml/hr Start 20% lipid emulsion at 10 ml/hr to infuse for 12 hours This provides 48 g of protein and 1160 kCals per day  Add MVI and trace elements to TPN Will add cbg q6 hours for now Monitor TPN labs   Excell Seltzer Poteet 05/19/2016,3:23 PM

## 2016-05-20 ENCOUNTER — Inpatient Hospital Stay (HOSPITAL_COMMUNITY): Payer: Medicare Other

## 2016-05-20 ENCOUNTER — Other Ambulatory Visit (HOSPITAL_COMMUNITY): Payer: Medicare Other

## 2016-05-20 DIAGNOSIS — Z978 Presence of other specified devices: Secondary | ICD-10-CM

## 2016-05-20 LAB — GLUCOSE, CAPILLARY
GLUCOSE-CAPILLARY: 136 mg/dL — AB (ref 65–99)
Glucose-Capillary: 115 mg/dL — ABNORMAL HIGH (ref 65–99)
Glucose-Capillary: 111 mg/dL — ABNORMAL HIGH (ref 65–99)
Glucose-Capillary: 129 mg/dL — ABNORMAL HIGH (ref 65–99)

## 2016-05-20 LAB — DIFFERENTIAL
BASOS PCT: 0 %
Basophils Absolute: 0 10*3/uL (ref 0.0–0.1)
Eosinophils Absolute: 0.2 10*3/uL (ref 0.0–0.7)
Eosinophils Relative: 2 %
Lymphocytes Relative: 23 %
Lymphs Abs: 2.2 10*3/uL (ref 0.7–4.0)
Monocytes Absolute: 1 10*3/uL (ref 0.1–1.0)
Monocytes Relative: 10 %
NEUTROS PCT: 64 %
Neutro Abs: 6.2 10*3/uL (ref 1.7–7.7)

## 2016-05-20 LAB — COMPREHENSIVE METABOLIC PANEL
ALBUMIN: 3.5 g/dL (ref 3.5–5.0)
ALK PHOS: 62 U/L (ref 38–126)
ALT: 12 U/L — ABNORMAL LOW (ref 17–63)
ANION GAP: 12 (ref 5–15)
AST: 13 U/L — ABNORMAL LOW (ref 15–41)
BILIRUBIN TOTAL: 0.4 mg/dL (ref 0.3–1.2)
BUN: 11 mg/dL (ref 6–20)
CO2: 23 mmol/L (ref 22–32)
CREATININE: 1.36 mg/dL — AB (ref 0.61–1.24)
Calcium: 8.8 mg/dL — ABNORMAL LOW (ref 8.9–10.3)
Chloride: 106 mmol/L (ref 101–111)
GFR calc non Af Amer: 53 mL/min — ABNORMAL LOW (ref >60)
GLUCOSE: 107 mg/dL — AB (ref 65–99)
Potassium: 4 mmol/L (ref 3.5–5.1)
Sodium: 141 mmol/L (ref 135–145)
Total Protein: 6.5 g/dL (ref 6.5–8.1)

## 2016-05-20 LAB — TRIGLYCERIDES: Triglycerides: 302 mg/dL — ABNORMAL HIGH (ref <150)

## 2016-05-20 LAB — CBC
HCT: 40.4 % (ref 39.0–52.0)
HEMOGLOBIN: 13.3 g/dL (ref 13.0–17.0)
MCH: 27.6 pg (ref 26.0–34.0)
MCHC: 32.9 g/dL (ref 30.0–36.0)
MCV: 83.8 fL (ref 78.0–100.0)
Platelets: 196 10*3/uL (ref 150–400)
RBC: 4.82 MIL/uL (ref 4.22–5.81)
RDW: 19.9 % — ABNORMAL HIGH (ref 11.5–15.5)
WBC: 9.6 10*3/uL (ref 4.0–10.5)

## 2016-05-20 LAB — PREALBUMIN: Prealbumin: 23.5 mg/dL (ref 18–38)

## 2016-05-20 LAB — PHOSPHORUS: PHOSPHORUS: 3.1 mg/dL (ref 2.5–4.6)

## 2016-05-20 LAB — MAGNESIUM: Magnesium: 1.7 mg/dL (ref 1.7–2.4)

## 2016-05-20 MED ORDER — FAT EMULSION 20 % IV EMUL
250.0000 mL | INTRAVENOUS | Status: AC
  Administered 2016-05-20: 250 mL via INTRAVENOUS
  Filled 2016-05-20: qty 250

## 2016-05-20 MED ORDER — TRACE MINERALS CR-CU-MN-SE-ZN 10-1000-500-60 MCG/ML IV SOLN
INTRAVENOUS | Status: AC
  Administered 2016-05-20 – 2016-05-21 (×2): via INTRAVENOUS
  Filled 2016-05-20: qty 1440

## 2016-05-20 NOTE — Progress Notes (Signed)
PHARMACY - ADULT TOTAL PARENTERAL NUTRITION CONSULT NOTE   Pharmacy Consult for TPN Indication: bowel dysfunction with NG tube placement for decompression and pain control  Patient Measurements: Height: '5\' 9"'$  (175.3 cm) Weight: 152 lb 12.8 oz (69.3 kg) IBW/kg (Calculated) : 70.7 TPN AdjBW (KG): 72.6 Body mass index is 22.56 kg/m.   Assessment:  66 yo man with long h/o bowel dysfunction to start TPN.  He currently has a NG tube in place for decompression and pain control. He is tolerating current TPN.  GI: Had small BM, may dc NG tube soon Endo: Has no h/o DM, cbgs are good with initiation of TPN Lytes: lytes are ok, Ca corrects to 9.2 Renal:  SrCr improved Pulm: Cards:  Hepatobil:TG are 302.  Will continue to monitor.  May need to decrease lipids Neuro: ID:  TPN Access: has PAC placed Nov 2017 TPN start date: 2/28  Nutritional Goals (pending RD recommendation ): kCal: Protein:   Current Nutrition: NPO, Clinimix E 5/15 at 40 ml/hr and 20% lipids at 10 ml/hr for 12 hours  Plan:  Change Clinimix E 5/15 to E 5/20 at 60 ml/hr 20% lipid emulsion at 10 ml/hr to infuse for 12 hours Add MVI and trace elements to TPN Continue cbg q6 hours.  May dc checks or add ssi as warranted F/u am BMET and RD recommendations   Excell Seltzer Poteet 05/20/2016,12:38 PM

## 2016-05-20 NOTE — Progress Notes (Signed)
Initial Nutrition Assessment   INTERVENTION:  TPN per pharmacy- titrate to meet estimated nutrition needs.  Kcal: 1900-2100  Protein: 83-90 gr  RD will continue to follow for pt readiness to resume oral intake  NUTRITION DIAGNOSIS:   Inadequate oral intake related to altered GI function as evidenced by NPO status.   GOAL:   Patient will meet greater than or equal to 90% of their needs  MONITOR:   Diet advancement  REASON FOR ASSESSMENT:   Consult New TPN/TNA  ASSESSMENT:  Patient is a 66 yo male with hx of colon cancer, multiple abdominal surgeries,  vitamin B12 and iron deficiency, GERD, recurring bowel obstruction. Pt reports no BM 5 days prior to admiision. He presents with c/o abdominal pain and decreased appetite and intake 2 days prior to ED arrival.  His home diet is regular. He usually eats eggs, toast breakfast, pasta type meal lunch and meat and veggies in the evening. His usual beverages include tea, water and Gatorade and he takes a MVI daily.  The patient wt his is negative for significant change: usual body weight ranging between 68-71 kg.  He has an NGT (placed himself?) to wall suction approx 200 cc output.   Patient is receiving TNA now:  Clinimix  @ 60 ml/hr.  Lipids (20% @ 10 ml/hr)., multivitamins.   Provides 1507 kcal and 72 grams protein daily (based on weekly average).    Meets 79% minimum estimated kcal and 87% minimum estimated protein needs.  Unable to complete Nutrition-Focused physical exam at this time.    Recent Labs Lab 05/16/16 0640 05/18/16 1044 05/20/16 0500  NA 138 139 141  K 4.6 4.3 4.0  CL 108 106 106  CO2 '24 23 23  '$ BUN '6 9 11  '$ CREATININE 1.36* 1.48* 1.36*  CALCIUM 7.6* 8.1* 8.8*  MG  --   --  1.7  PHOS  --   --  3.1  GLUCOSE 86 79 107*  Labs and Meds reviewed.   Diet Order:  Diet NPO time specified TPN (CLINIMIX-E) Adult TPN (CLINIMIX-E) Adult  Skin:  Reviewed, no issues  Last BM:  3/1 small (type 1)  Height:    Ht Readings from Last 1 Encounters:  05/15/16 '5\' 9"'$  (1.753 m)    Weight:   Wt Readings from Last 1 Encounters:  05/19/16 152 lb 12.8 oz (69.3 kg)    Ideal Body Weight:  73 kg  BMI:  Body mass index is 22.56 kg/m.  Estimated Nutritional Needs:   Kcal:  1900-2100   Protein:  83-90 gr  Fluid:  1.9-2.1 liters daily  EDUCATION NEEDS:   No education needs identified at this time  Colman Cater MS,RD,CSG,LDN Office: #[phone] Pager: [phone]

## 2016-05-20 NOTE — ACP (Advance Care Planning) (Signed)
Gave [name] Advance Directive information. He lives in [place] which has different laws so he stated he would follow up when he returns home. We did discuss the information.

## 2016-05-20 NOTE — Progress Notes (Signed)
PROGRESS NOTE    [name]  JXB:[id number] DOB: 07-02-1950 DOA: 05/15/2016 PCP: [name], MD    Brief Narrative:  [name] is a 66 y.o. 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 Patient with known history of recurrent SBO/ileus, where he required hospitalization at [facility] status post exploratory laparotomy December 2017, with lysis of some adhesions, otherwise no acute findings he was discharged on TPN with gradual advancement of his diet.   Assessment & Plan:   Principal Problem:   Ileus ([name]) Active Problems:   Anemia, chronic disease   Esophageal reflux disease   Chronic abdominal pain   Chronic diastolic heart failure (HCC)   Recurrent SBO - Even though no obstruction seen on abdominal x-ray in ED, patient with known history of recurrent SBO, giving multiple abdominal surgeries most recent exploratory laparotomy at [facility] on September 2018 significant for lysis of some adhesions, required prolonged TPN course then. - Still significant NGT output, with 900 mL over last 24 hours, patient reports small bowel movement yesterday, but no flatness, per surgery GT hopefully can be discontinued tomorrow, repeat abdominal x-ray today with improved bowel gas pattern - continue with IV TPN. -  surgery input appreciated, continue with conservative management, bowel rest, monitor and replete electrolytes as needed, continue with when necessary nausea and pain medication.  Iron deficiency anemia - Receiving IV iron and oncology clinic  GERD - can give IV protonix when starting clears  Chronic diastolic heart failure - Appears to be euvolemic currently, monitor closely as on TPN  History of PE - Continue with Lovenox, patient reports he has been in current dose since 2011  Hx of  colorectal cancer  - Stage III, diagnosed in May 2012 and s/p FOLFOX in 2012  - EGD and colonoscopy reportedly normal in February 2017  - Ongoing management per oncology    History of hypertension - Home medication on hold given his nothing by mouth, will continue with when necessary hydralazine  History of seizures - Continue with IV Keppra   DVT prophylaxis: Lovenox Code Status: Full code Family Communication: No family bedside  Disposition Plan:  pending further workup    Consultants:   Gen surgery  Procedures:   None  Antimicrobials:   None    Subjective: Patient laying in bed. And reports he didn't pass any gas yet, but he had a small bowel movement yesterday evening .  Says pain is well controlled.  Objective: Vitals:   05/19/16 1420 05/19/16 1551 05/19/16 2059 05/19/16 2155  BP: 138/82   134/88  Pulse: 74   66  Resp: 18   20  Temp: 98.6 F (37 C)   98.2 F (36.8 C)  TempSrc: Oral   Oral  SpO2: 98%  92% 93%  Weight:  69.3 kg (152 lb 12.8 oz)    Height:        Intake/Output Summary (Last 24 hours) at 05/20/16 1059 Last data filed at 05/20/16 0700  Gross per 24 hour  Intake            237.5 ml  Output             1500 ml  Net          -1262.5 ml   Filed Weights   05/15/16 0100 05/19/16 1551  Weight: 72.6 kg (160 lb)  69.3 kg (152 lb 12.8 oz)    Examination:  General exam: Appears calm and comfortable  Respiratory system: Clear to auscultation. Respiratory effort normal. Cardiovascular system: S1 & S2 heard, RRR. No JVD, murmurs, rubs, gallops or clicks. No pedal edema. Gastrointestinal system: Numerous surgical scars on abdomen, Abdomen is nondistended, soft and nontender. Normal bowel sounds heard. Central nervous system: Alert and oriented. No focal neurological deficits. Extremities: Symmetric 5 x 5 power. Skin: No rashes, lesions or ulcers Psychiatry: Judgement and insight appear normal. Mood & affect appropriate.     Data Reviewed:  I have personally reviewed following labs and imaging studies  CBC:  Recent Labs Lab 05/15/16 0207 05/18/16 1044 05/20/16 0500  WBC 8.4 7.1 9.6  NEUTROABS 4.8 4.3 6.2  HGB 12.7* 12.8* 13.3  HCT 38.3* 39.2 40.4  MCV 81.7 82.7 83.8  PLT 194 171 798   Basic Metabolic Panel:  Recent Labs Lab 05/15/16 0207 05/16/16 0640 05/18/16 1044 05/20/16 0500  NA 136 138 139 141  K 5.1 4.6 4.3 4.0  CL 106 108 106 106  CO2 '24 24 23 23  '$ GLUCOSE 95 86 79 107*  BUN '6 6 9 11  '$ CREATININE 1.35* 1.36* 1.48* 1.36*  CALCIUM 8.5* 7.6* 8.1* 8.8*  MG  --   --   --  1.7  PHOS  --   --   --  3.1   GFR: Estimated Creatinine Clearance: 53.1 mL/min (by C-G formula based on SCr of 1.36 mg/dL (H)). Liver Function Tests:  Recent Labs Lab 05/15/16 0207 05/18/16 1044 05/20/16 0500  AST 29 16 13*  ALT 23 12* 12*  ALKPHOS 83 73 62  BILITOT 0.5 0.9 0.4  PROT 6.5 6.2* 6.5  ALBUMIN 3.8 3.5 3.5    Recent Labs Lab 05/15/16 0207  LIPASE 11   No results for input(s): AMMONIA in the last 168 hours. Coagulation Profile: No results for input(s): INR, PROTIME in the last 168 hours. Cardiac Enzymes:  Recent Labs Lab 05/15/16 0207  TROPONINI <0.03   BNP (last 3 results) No results for input(s): PROBNP in the last 8760 hours. HbA1C: No results for input(s): HGBA1C in the last 72 hours. CBG:  Recent Labs Lab 05/19/16 1758 05/20/16 0001 05/20/16 0618 05/20/16 0801  GLUCAP 80 100* 115* 111*   Lipid Profile:  Recent Labs  05/20/16 0500  TRIG 302*   Thyroid Function Tests: No results for input(s): TSH, T4TOTAL, FREET4, T3FREE, THYROIDAB in the last 72 hours. Anemia Panel: No results for input(s): VITAMINB12, FOLATE, FERRITIN, TIBC, IRON, RETICCTPCT in the last 72 hours. Sepsis Labs:  Recent Labs Lab 05/18/16 1209 05/18/16 1437  LATICACIDVEN 0.7 0.8    No results found for this or any previous visit (from the past 240 hour(s)).       Radiology Studies: Dg Chest Port 1  View  Result Date: 05/19/2016 CLINICAL DATA:  Assess position of nasogastric tube EXAM: PORTABLE CHEST 1 VIEW COMPARISON:  Chest x-ray of May 17, 2016 FINDINGS: The nasogastric tube tip lies in the region of the distal stomach. Its course through the thorax appears unchanged. The power port catheter tip projects over the proximal SVC. The lungs are adequately inflated. The heart and pulmonary vascularity are normal. There is tortuosity of the ascending and descending thoracic aorta. IMPRESSION: The esophagogastric tube appears to be in fairly stable position with the tip projecting near the pylorus. Electronically Signed   By: David  Martinique M.D.   On: 05/19/2016 15:38   Dg Abd  2 Views  Result Date: 05/20/2016 CLINICAL DATA:  Followup small bowel obstruction. EXAM: ABDOMEN - 2 VIEW COMPARISON:  05/15/2016 FINDINGS: The NG tube tip appears to be in the descending duodenum. Interval decrease and small bowel air. There is some persistent air in the cecum. Surgical changes are again demonstrated. The lung bases are grossly clear. Streaky basilar atelectasis and scarring change on the left. IMPRESSION: Improved bowel gas pattern with NG tube in place. Electronically Signed   By: Marijo Sanes M.D.   On: 05/20/2016 09:35        Scheduled Meds: . bisacodyl  10 mg Rectal BID  . enoxaparin  60 mg Subcutaneous QHS  . levETIRAcetam  750 mg Intravenous Q12H   Continuous Infusions: . Marland KitchenTPN (CLINIMIX-E) Adult 40 mL/hr at 05/19/16 1732     LOS: 5 days     Phillips Climes, MD [facility] Pager [phone]  If 7PM-7AM, please contact night-coverage [email or web address] Password [email or web address] 05/20/2016, 10:59 AM

## 2016-05-20 NOTE — Progress Notes (Signed)
  Subjective: Patient states he has small bowel movement yesterday. Otherwise resting can't wait.  Objective: Vital signs in last 24 hours: Temp:  [98.2 F (36.8 C)-98.6 F (37 C)] 98.2 F (36.8 C) (02/28 2155) Pulse Rate:  [66-74] 66 (02/28 2155) Resp:  [18-20] 20 (02/28 2155) BP: (134-138)/(82-88) 134/88 (02/28 2155) SpO2:  [92 %-98 %] 93 % (02/28 2155) Weight:  [152 lb 12.8 oz (69.3 kg)] 152 lb 12.8 oz (69.3 kg) (02/28 1551) Last BM Date: 05/13/16  Intake/Output from previous day: 02/28 0701 - 03/01 0700 In: 237.5 [I.V.:37.5; IV Piggyback:200] Out: 1500 [Urine:600; Emesis/NG output:900] Intake/Output this shift: No intake/output data recorded.  General appearance: alert, cooperative and no distress GI: Soft, flat. Occasional bowel sounds appreciated. No rigidity noted. No hernias noted.  Lab Results:   Recent Labs  05/18/16 1044 05/20/16 0500  WBC 7.1 9.6  HGB 12.8* 13.3  HCT 39.2 40.4  PLT 171 196   BMET  Recent Labs  05/18/16 1044 05/20/16 0500  NA 139 141  K 4.3 4.0  CL 106 106  CO2 23 23  GLUCOSE 79 107*  BUN 9 11  CREATININE 1.48* 1.36*  CALCIUM 8.1* 8.8*   PT/INR No results for input(s): LABPROT, INR in the last 72 hours.  Studies/Results: Dg Chest Port 1 View  Result Date: 05/19/2016 CLINICAL DATA:  Assess position of nasogastric tube EXAM: PORTABLE CHEST 1 VIEW COMPARISON:  Chest x-ray of May 17, 2016 FINDINGS: The nasogastric tube tip lies in the region of the distal stomach. Its course through the thorax appears unchanged. The power port catheter tip projects over the proximal SVC. The lungs are adequately inflated. The heart and pulmonary vascularity are normal. There is tortuosity of the ascending and descending thoracic aorta. IMPRESSION: The esophagogastric tube appears to be in fairly stable position with the tip projecting near the pylorus. Electronically Signed   By: David  Martinique M.D.   On: 05/19/2016 15:38     Anti-infectives: Anti-infectives    None      Assessment/Plan: Impression: Small bowel dysfunction, is starting to have return of bowel function. Still has 900 mL of NG tube output. Plan: We'll reassess in a.m. Hopefully can pull NG tube tomorrow. Continue current management.  LOS: 5 days    Aviva Signs 05/20/2016

## 2016-05-21 DIAGNOSIS — Z978 Presence of other specified devices: Secondary | ICD-10-CM

## 2016-05-21 DIAGNOSIS — R109 Unspecified abdominal pain: Secondary | ICD-10-CM

## 2016-05-21 DIAGNOSIS — K567 Ileus, unspecified: Principal | ICD-10-CM

## 2016-05-21 DIAGNOSIS — R140 Abdominal distension (gaseous): Secondary | ICD-10-CM

## 2016-05-21 DIAGNOSIS — R112 Nausea with vomiting, unspecified: Secondary | ICD-10-CM

## 2016-05-21 DIAGNOSIS — K219 Gastro-esophageal reflux disease without esophagitis: Secondary | ICD-10-CM

## 2016-05-21 DIAGNOSIS — D638 Anemia in other chronic diseases classified elsewhere: Secondary | ICD-10-CM

## 2016-05-21 DIAGNOSIS — K56609 Unspecified intestinal obstruction, unspecified as to partial versus complete obstruction: Secondary | ICD-10-CM

## 2016-05-21 DIAGNOSIS — G8929 Other chronic pain: Secondary | ICD-10-CM

## 2016-05-21 LAB — GLUCOSE, CAPILLARY
GLUCOSE-CAPILLARY: 113 mg/dL — AB (ref 65–99)
GLUCOSE-CAPILLARY: 148 mg/dL — AB (ref 65–99)
Glucose-Capillary: 134 mg/dL — ABNORMAL HIGH (ref 65–99)
Glucose-Capillary: 146 mg/dL — ABNORMAL HIGH (ref 65–99)
Glucose-Capillary: 167 mg/dL — ABNORMAL HIGH (ref 65–99)

## 2016-05-21 LAB — BASIC METABOLIC PANEL
ANION GAP: 11 (ref 5–15)
BUN: 19 mg/dL (ref 6–20)
CHLORIDE: 105 mmol/L (ref 101–111)
CO2: 24 mmol/L (ref 22–32)
CREATININE: 1.44 mg/dL — AB (ref 0.61–1.24)
Calcium: 8.9 mg/dL (ref 8.9–10.3)
GFR calc non Af Amer: 50 mL/min — ABNORMAL LOW (ref >60)
GFR, EST AFRICAN AMERICAN: 57 mL/min — AB (ref >60)
Glucose, Bld: 153 mg/dL — ABNORMAL HIGH (ref 65–99)
Potassium: 3.6 mmol/L (ref 3.5–5.1)
SODIUM: 140 mmol/L (ref 135–145)

## 2016-05-21 MED ORDER — FAT EMULSION 20 % IV EMUL
250.0000 mL | Freq: Once | INTRAVENOUS | Status: AC
  Administered 2016-05-21: 250 mL via INTRAVENOUS
  Filled 2016-05-21: qty 250

## 2016-05-21 MED ORDER — POLYETHYLENE GLYCOL 3350 17 G PO PACK
17.0000 g | PACK | Freq: Every day | ORAL | Status: DC
  Administered 2016-05-21 – 2016-05-23 (×3): 17 g via ORAL
  Filled 2016-05-21 (×3): qty 1

## 2016-05-21 MED ORDER — OXYCODONE-ACETAMINOPHEN 5-325 MG PO TABS
1.0000 | ORAL_TABLET | ORAL | Status: DC | PRN
Start: 2016-05-21 — End: 2016-05-23
  Administered 2016-05-21 – 2016-05-23 (×10): 1 via ORAL
  Filled 2016-05-21 (×10): qty 1

## 2016-05-21 MED ORDER — BOOST / RESOURCE BREEZE PO LIQD
1.0000 | Freq: Three times a day (TID) | ORAL | Status: DC
Start: 2016-05-21 — End: 2016-05-23
  Administered 2016-05-21 – 2016-05-23 (×4): 1 via ORAL

## 2016-05-21 MED ORDER — POTASSIUM CHLORIDE 10 MEQ/100ML IV SOLN
10.0000 meq | INTRAVENOUS | Status: AC
  Administered 2016-05-21 (×4): 10 meq via INTRAVENOUS
  Filled 2016-05-21: qty 100

## 2016-05-21 MED ORDER — FAT EMULSION 20 % IV EMUL
250.0000 mL | Freq: Once | INTRAVENOUS | Status: DC
  Filled 2016-05-21: qty 250

## 2016-05-21 MED ORDER — TRACE MINERALS CR-CU-MN-SE-ZN 10-1000-500-60 MCG/ML IV SOLN
INTRAVENOUS | Status: AC
  Administered 2016-05-21: 17:00:00 via INTRAVENOUS
  Filled 2016-05-21: qty 1680

## 2016-05-21 NOTE — Consult Note (Signed)
SLF-REVIEWED-NO ADDITIONAL RECOMMENDATIONS.   Referring Provider: No ref. provider found Primary Care Physician:  [name], MD Primary Gastroenterologist:  Dr. [name]  Date of Admission: 05/15/16 Date of Consultation: 05/21/16  Reason for Consultation:  Ileus, resolving  HPI:  [name] is a 66 y.o. male with a past medical history of stage 3 adenocarcinoma in 2012, barrett's esophagus, recurrent SBO, PE, htn. Per hospitalist notes, presented with abdominal pain 2 days prior to presentation. Recently admitted to [place] s/p exploratory lap 2017 with lysis of adhesions and d/c home on prolonged TPN with gradual advancement of diet. XRay in the ED with no obstruction. Eventual NGT placement with significant output. Surgery consult who recommended conservative management.   Today the patient states he's not feeling well, feels about the same he did on admission. Continued abdominal pain, some nausea no vomiting. Nausea and pain medications help. Small bowel movement yesterday, none since. No flatus. Surgery at [facility] was exploratory, no resection. Did have resection in 2012 for colon CA. No other upper or lower GI symptoms.   Past Medical History:  Diagnosis Date  . 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 Anemia   . Anxiety   . Arthritis   . Barrett's esophagus   . Blood transfusion   . Bowel obstruction 05/13/2012   Recurrent  . Bronchitis   . Chest pain at rest   . Chronic abdominal pain   . Erosive esophagitis   . ETOH abuse    quit 03/2010  . GERD (gastroesophageal reflux disease)   . Hx of Clostridium difficile infection 01/2012  . Hypertension   . Ileus ([place])   . Iron deficiency anemia 03/23/2016  . Obstruction of bowel 03/03/14  . Osteoporosis   . Personal history of PE (pulmonary embolism) 10/01/2010  . Pneumonia   . Pulmonary embolism ([place]) 02/2010  . Recurrent upper respiratory infection (URI)   . S/P endoscopy September 28, 2010   erosive  reflux esophagitis, Billroth I anatomy  . 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 Seizures ([place])   . Shortness of breath   . Sleep apnea   . TIA (transient ischemic attack) 10/11  . Vitamin B12 deficiency     Past Surgical History:  Procedure Laterality Date  . ABDOMINAL ADHESION SURGERY  03/04/15   @ [facility]  . ABDOMINAL EXPLORATION SURGERY    . abdominal sugery     for bowel obstruction x 8, all in 1980s, except for one in 07/2010  . APPENDECTOMY  1980s  . Billroth 1 hemigastrectomy  1980s   per patient for benign duodenal tumor  . CARDIAC CATHETERIZATION  07/17/2012  . CHOLECYSTECTOMY  1980s  . COLON SURGERY  May 2012   left hemicolectomy, colon cancer found at time of surgery for bowel obstruction  . COLONOSCOPY  03/18/2011   anastomosis at 35cm. Several adenomatous polyps removed. Sigmoid diverticulosis. Next TCS 02/2013  . COLONOSCOPY N/A 07/24/2012   WRU:EAVWUJ post segmental resection with normal-appearing colonic anastomosis aside from an adjacent polyp-removed as described above. Rectal polyp-removed as described above. CT findings appear to have been artifactual. tubular adenomas/prolapsed type polyp.  . COLONOSCOPY N/A 05/15/2015   Procedure: COLONOSCOPY;  Surgeon: [name], MD;  Location: [facility];  Service: Endoscopy;  Laterality: N/A;  . ESOPHAGOGASTRODUODENOSCOPY  09/28/2010  . ESOPHAGOGASTRODUODENOSCOPY  12/01/2010   Cervical web status post dilation, erosive esophagitis, B1 hemigastrectomy, inflamed anastomosis  . ESOPHAGOGASTRODUODENOSCOPY  04/16/2011   excoriation at GEJ c/w trauma/M-W  tear, friable gastric anastomosis, dilation efferent limb  . ESOPHAGOGASTRODUODENOSCOPY N/A 06/03/2014   Dr.[name]- cervcal esopphageal web s/p dilation. abnormal distal esophagus bx= barretts esophagus  . ESOPHAGOGASTRODUODENOSCOPY N/A 05/15/2015   Procedure: ESOPHAGOGASTRODUODENOSCOPY (EGD);  Surgeon: [name], MD;  Location: [facility];  Service: Endoscopy;  Laterality: N/A;   230  . ESOPHAGOGASTRODUODENOSCOPY (EGD) WITH ESOPHAGEAL DILATION  02/25/2012   ZWC:HENIDPOE esophageal web-s/p dilation anddisruption as described above. Status post prior gastric with Billroth I configuration. Abnormal gastric mucosa at the anastomosis. Gastric biopsy showed mild chronic inflammation but no H. pylori   . HERNIA REPAIR     right inguinal  . MALONEY DILATION N/A 06/03/2014   Procedure: [name] DILATION;  Surgeon: [name], MD;  Location: [facility];  Service: Endoscopy;  Laterality: N/A;  [name] DILATION N/A 05/15/2015   Procedure: [name] DILATION;  Surgeon: [name], MD;  Location: [facility];  Service: Endoscopy;  Laterality: N/A;  . PORTACATH PLACEMENT    . SAVORY DILATION N/A 06/03/2014   Procedure: SAVORY DILATION;  Surgeon: [name], MD;  Location: [facility];  Service: Endoscopy;  Laterality: N/A;    Prior to Admission medications   Medication Sig Start Date End Date Taking? Authorizing Provider  acetaminophen (TYLENOL) 500 MG tablet Take 1,000 mg by mouth every 6 (six) hours as needed for mild pain, moderate pain or headache.   Yes Historical Provider, MD  acidophilus (RISAQUAD) CAPS capsule Take 1 capsule by mouth daily.   Yes Historical Provider, MD  albuterol (PROAIR HFA) 108 (90 BASE) MCG/ACT inhaler Inhale 1-2 puffs into the lungs every 6 (six) hours as needed for wheezing or shortness of breath.    Yes Historical Provider, MD  atorvastatin (LIPITOR) 20 MG tablet Take 20 mg by mouth at bedtime.    Yes Historical Provider, MD  carvedilol (COREG) 6.25 MG tablet Take 6.25 mg by mouth 2 (two) times daily with a meal.   Yes Historical Provider, MD  cholecalciferol (VITAMIN D) 1000 UNITS tablet Take 1,000 Units by mouth daily.    Yes Historical Provider, MD  Copper Gluconate 2 MG CAPS Take 1 capsule by mouth daily. 05/03/16  Yes Manon Hilding Kefalas, PA-C  cyanocobalamin (,VITAMIN B-12,) 1000 MCG/ML injection Inject 1,000 mcg into the muscle every  30 (thirty) days.   Yes Historical Provider, MD  dexlansoprazole (DEXILANT) 60 MG capsule Take 60 mg by mouth daily.   Yes Historical Provider, MD  docusate sodium (COLACE) 100 MG capsule Take 100 mg by mouth 2 (two) times daily.   Yes Historical Provider, MD  enoxaparin (LOVENOX) 60 MG/0.6ML injection Inject 60 mg into the skin at bedtime.    Yes Historical Provider, MD  ferrous sulfate 325 (65 FE) MG tablet Take 1 tablet (325 mg total) by mouth 2 (two) times daily with a meal. 03/21/16  Yes Thurnell Lose, MD  levETIRAcetam (KEPPRA) 750 MG tablet Take 750 mg by mouth 2 (two) times daily.   Yes Historical Provider, MD  lidocaine-prilocaine (EMLA) cream Apply 1 application topically as needed (prior to accessing port).   Yes Historical Provider, MD  LORazepam (ATIVAN) 1 MG tablet Take 1 mg by mouth every 4 (four) hours as needed for anxiety.    Yes Historical Provider, MD  magnesium oxide (MAGNESIUM-OXIDE) 400 (241.3 Mg) MG tablet Take 400 mg by mouth 2 (two) times daily.   Yes Historical Provider, MD  Multiple Vitamin (MULTIVITAMIN WITH MINERALS) TABS tablet Take 1 tablet by  mouth daily.   Yes Historical Provider, MD  Nutritional Supplements (ENSURE PLUS HN) LIQD Take 1 Bottle by mouth every other day.   Yes Historical Provider, MD  ondansetron (ZOFRAN) 4 MG tablet Take 1 tablet (4 mg total) by mouth every 8 (eight) hours as needed for nausea. 02/22/14  Yes Aviva Signs, MD  polyethylene glycol [facility] / GLYCOLAX) packet Take 17 g by mouth daily as needed for mild constipation or moderate constipation.   Yes Historical Provider, MD  prochlorperazine (COMPAZINE) 10 MG tablet Take 10 mg by mouth every 6 (six) hours as needed for nausea or vomiting.    Yes Historical Provider, MD  promethazine (PHENERGAN) 25 MG tablet Take 1 tablet (25 mg total) by mouth every 6 (six) hours as needed for nausea. 07/07/15  Yes Davonna Belling, MD  tamsulosin (FLOMAX) 0.4 MG CAPS capsule Take 0.4 mg by mouth at bedtime.     Yes Historical Provider, MD  Vitamin D, Ergocalciferol, (DRISDOL) 50000 units CAPS capsule Take 50,000 Units by mouth every Sunday.   Yes Historical Provider, MD  zoledronic acid (RECLAST) 5 MG/100ML SOLN injection Inject 5 mg into the vein See admin instructions. Pt gets a dose yearly.   Yes Historical Provider, MD    Current Facility-Administered Medications  Medication Dose Route Frequency Provider Last Rate Last Dose  . albuterol (PROVENTIL) (2.5 MG/3ML) 0.083% nebulizer solution 2.5 mg  2.5 mg Nebulization Q6H PRN Eber Jones, MD      . bisacodyl (DULCOLAX) suppository 10 mg  10 mg Rectal Daily PRN Eber Jones, MD      . bisacodyl (DULCOLAX) suppository 10 mg  10 mg Rectal BID Aviva Signs, MD   10 mg at 05/20/16 1754  . diphenhydrAMINE (BENADRYL) injection 25 mg  25 mg Intravenous Q8H PRN Phillips Grout, MD   25 mg at 05/20/16 1903  . enoxaparin (LOVENOX) injection 60 mg  60 mg Subcutaneous QHS Eber Jones, MD   60 mg at 05/20/16 2158  . fentaNYL (SUBLIMAZE) injection 75 mcg  75 mcg Intravenous Q2H PRN Eber Jones, MD   75 mcg at 05/21/16 0703  . hydrALAZINE (APRESOLINE) injection 5 mg  5 mg Intravenous Q4H PRN Albertine Patricia, MD      . levETIRAcetam (KEPPRA) 750 mg/ 50 mL IVPB  750 mg Intravenous Q12H Eber Jones, MD   750 mg at 05/20/16 2209  . LORazepam (ATIVAN) injection 0.5 mg  0.5 mg Intravenous Q6H PRN Eber Jones, MD      . ondansetron [facility]) tablet 4 mg  4 mg Oral Q6H PRN Eber Jones, MD       Or  . ondansetron [facility]) injection 4 mg  4 mg Intravenous Q6H PRN Eber Jones, MD   4 mg at 05/21/16 0703  . sodium phosphate (FLEET) 7-19 GM/118ML enema 1 enema  1 enema Rectal Once PRN Eber Jones, MD      . TPN (CLINIMIX-E) Adult   Intravenous Continuous TPN Albertine Patricia, MD 60 mL/hr at 05/20/16 1904     Facility-Administered Medications Ordered in Other Encounters  Medication Dose Route  Frequency Provider Last Rate Last Dose  . heparin lock flush 100 unit/mL  500 Units Intravenous Once Baird Cancer, PA-C        Allergies as of 05/15/2016  . (No Known Allergies)    Family History  Problem Relation Age of Onset  . Hypertension Mother   . Arthritis Mother   .  Pneumonia Mother   . Hypertension Father   . Heart attack Father     Social History   Social History  . Marital status: Married    Spouse name: N/A  . Number of children: 3  . Years of education: N/A   Occupational History  .  [employer]   Social History Main Topics  . Smoking status: Former Smoker    Packs/day: 0.50    Years: 40.00    Types: Cigarettes    Quit date: 12/20/2012  . Smokeless tobacco: Never Used  . Alcohol use No  . Drug use: No  . Sexual activity: No   Other Topics Concern  . Not on file   Social History Narrative  . No narrative on file    Review of Systems: General: Negative for anorexia, weight loss, fever, chills, fatigue, weakness. ENT: Negative for hoarseness, difficulty swallowing. CV: Negative for chest pain, angina, palpitations, peripheral edema.  Respiratory: Negative for dyspnea at rest, cough, sputum, wheezing.  GI: See history of present illness. Endo: Negative for unusual weight change.  Heme: Negative for bruising or bleeding. Allergy: Negative for rash or hives.  Physical Exam: Vital signs in last 24 hours: Temp:  [98.2 F (36.8 C)-98.9 F (37.2 C)] 98.2 F (36.8 C) (03/02 0524) Pulse Rate:  [89-91] 89 (03/02 0524) Resp:  [20] 20 (03/02 0524) BP: (121-137)/(79-86) 121/79 (03/02 0524) SpO2:  [94 %] 94 % (03/02 0524) Last BM Date: 05/20/16 General:   Thin male. Alert, pleasant and cooperative in NAD Head:  Normocephalic and atraumatic. Eyes:  Sclera clear, no icterus. Conjunctiva pink. Ears:  Normal auditory acuity. Neck:  Supple; no masses or thyromegaly. Lungs:  Clear throughout to auscultation. No wheezes, crackles, or rhonchi. No  acute distress. Heart:  Regular rate and rhythm; no murmurs, clicks, rubs,  or gallops. Abdomen:  Soft, and nondistended. Minimal TTP mid to upper abdomen. Hypoactive to normal bowel sounds, without guarding, and without rebound.   Rectal:  Deferred until time of colonoscopy.   Msk:  Symmetrical without gross deformities. Extremities:  Without clubbing or edema. Neurologic:  Alert and  oriented x4;  grossly normal neurologically. Skin:  Intact without significant lesions or rashes. Midline abdominal incisional scar noted. Psych:  Alert and cooperative. Normal mood and affect.  Intake/Output from previous day: 03/01 0701 - 03/02 0700 In: 1954 [I.V.:1854; IV Piggyback:100] Out: 750 [Emesis/NG output:750] Intake/Output this shift: Total I/O In: 0  Out: 150 [Urine:150]  Lab Results:  Recent Labs  05/18/16 1044 05/20/16 0500  WBC 7.1 9.6  HGB 12.8* 13.3  HCT 39.2 40.4  PLT 171 196   BMET  Recent Labs  05/18/16 1044 05/20/16 0500 05/21/16 0443  NA 139 141 140  K 4.3 4.0 3.6  CL 106 106 105  CO2 '23 23 24  '$ GLUCOSE 79 107* 153*  BUN '9 11 19  '$ CREATININE 1.48* 1.36* 1.44*  CALCIUM 8.1* 8.8* 8.9   LFT  Recent Labs  05/18/16 1044 05/20/16 0500  PROT 6.2* 6.5  ALBUMIN 3.5 3.5  AST 16 13*  ALT 12* 12*  ALKPHOS 73 62  BILITOT 0.9 0.4   PT/INR No results for input(s): LABPROT, INR in the last 72 hours. Hepatitis Panel No results for input(s): HEPBSAG, HCVAB, HEPAIGM, HEPBIGM in the last 72 hours. C-Diff No results for input(s): CDIFFTOX in the last 72 hours.  Studies/Results: Dg Chest Port 1 View  Result Date: 05/19/2016 CLINICAL DATA:  Assess position of nasogastric tube EXAM: PORTABLE CHEST  1 VIEW COMPARISON:  Chest x-ray of May 17, 2016 FINDINGS: The nasogastric tube tip lies in the region of the distal stomach. Its course through the thorax appears unchanged. The power port catheter tip projects over the proximal SVC. The lungs are adequately inflated. The  heart and pulmonary vascularity are normal. There is tortuosity of the ascending and descending thoracic aorta. IMPRESSION: The esophagogastric tube appears to be in fairly stable position with the tip projecting near the pylorus. Electronically Signed   By: David  Martinique M.D.   On: 05/19/2016 15:38   Dg Abd 2 Views  Result Date: 05/20/2016 CLINICAL DATA:  Followup small bowel obstruction. EXAM: ABDOMEN - 2 VIEW COMPARISON:  05/15/2016 FINDINGS: The NG tube tip appears to be in the descending duodenum. Interval decrease and small bowel air. There is some persistent air in the cecum. Surgical changes are again demonstrated. The lung bases are grossly clear. Streaky basilar atelectasis and scarring change on the left. IMPRESSION: Improved bowel gas pattern with NG tube in place. Electronically Signed   By: Marijo Sanes M.D.   On: 05/20/2016 09:35    Impression: 66 year old male presented to ER with abdominal pain and emesis, XRay with probable ileus (history of recurrent SBO) but no noted obstruction. Managed conservatively, NGT was eventually placed. Yesterday had a small bowel movement and 900 mL over the previous 24 hours. Possibly able to d/c NGT today per surgery. Repeat abdominal XRay yesterday showed improved bowel gas pattern. Remains on IV TPN.  Patient has multiple SBOs historically and history of colon cancer in 2012. Ileus with NGT and apparently having return of bowel function with a small bowel movement yesterday and improved XRay yesterday. Clinically does not feel significantly improved. NGT remains in place with 3/4 full. No more bowel movement, no flatus. Remains on TPN and lipids. Cr stable today. No leucocytosis yesterday.   Plan: 1. Continue conservative management 2. Will see as an outpatient 4-6 weeks after discharge 3. Continue pain and nausea management   Thank you for allowing [place] to participate in the care of Paula Compton, DNP, AGNP-C Adult & Gerontological  Nurse Practitioner [facility]    LOS: 6 days     05/21/2016, 9:19 AM

## 2016-05-21 NOTE — Progress Notes (Signed)
PROGRESS NOTE    [name]  FXT:[id number] DOB: 1950/03/30 DOA: 05/15/2016 PCP: [name], MD    Brief Narrative:  [name] is a 66 y.o. 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 Patient with known history of recurrent SBO/ileus, where he required hospitalization at [facility] status post exploratory laparotomy December 2017, with lysis of some adhesions, otherwise no acute findings he was discharged on TPN with gradual advancement of his diet.   Assessment & Plan:   Principal Problem:   Ileus ([place]) Active Problems:   Anemia, chronic disease   Esophageal reflux disease   Chronic abdominal pain   Chronic diastolic heart failure (HCC)   Patient has nasogastric tube   Recurrent SBO - Even though no obstruction seen on abdominal x-ray in ED, patient with known history of recurrent SBO, giving multiple abdominal surgeries most recent exploratory laparotomy at [facility] on September 2018 significant for lysis of some adhesions, required prolonged TPN course then. - Gen. surgery input greatly appreciated, and had one bowel movement yesterday with suppository, output is decreasing, 750 over last 24 hours , she discontinued today and start a clear liquid diet by general surgery . - repeat abdominal x-ray today with improved bowel gas pattern - continue with IV TPN.  Iron deficiency anemia - Receiving IV iron and oncology clinic  GERD - can give IV protonix when starting clears  Chronic diastolic heart failure - Appears to be euvolemic currently, monitor closely as on TPN  History of PE - Continue with Lovenox, patient reports he has been in current dose since 2011  Hx of colorectal cancer  - Stage III, diagnosed in May 2012 and s/p FOLFOX in 2012  - EGD and colonoscopy reportedly normal in  February 2017  - Ongoing management per oncology    History of hypertension - Home medication on hold given his nothing by mouth, will continue with when necessary hydralazine  History of seizures - Continue with IV Keppra   DVT prophylaxis: Lovenox, he was encouraged to ambulate Code Status: Full code Family Communication: No family bedside  Disposition Plan:  pending further workup    Consultants:   Gen surgery  Procedures:   None  Antimicrobials:   None    Subjective: Patient laying in bed.  abdominal pain is controlled, reports one bowel movement with suppository yesterday  Objective: Vitals:   05/19/16 2155 05/20/16 1451 05/20/16 2204 05/21/16 0524  BP: 134/88 137/86 125/81 121/79  Pulse: 66 89 91 89  Resp: '20 20 20 20  '$ Temp: 98.2 F (36.8 C) 98.9 F (37.2 C) 98.8 F (37.1 C) 98.2 F (36.8 C)  TempSrc: Oral Oral Oral Oral  SpO2: 93% 94% 94% 94%  Weight:      Height:        Intake/Output Summary (Last 24 hours) at 05/21/16 1301 Last data filed at 05/21/16 1000  Gross per 24 hour  Intake          1953.99 ml  Output             1600 ml  Net           353.99 ml   Filed Weights   05/15/16 0100 05/19/16 1551  Weight: 72.6 kg (160 lb) 69.3 kg (152 lb 12.8 oz)    Examination:  General exam: Appears calm and comfortable  Respiratory system: Clear to auscultation. Respiratory effort normal. Cardiovascular system: S1 & S2 heard, RRR. No JVD, murmurs, rubs, gallops or clicks. No pedal edema. Gastrointestinal system: Numerous surgical scars on abdomen, Abdomen is nondistended, soft and nontender. Normal bowel sounds heard. Central nervous system: Alert and oriented. No focal neurological deficits. Extremities: Symmetric 5 x 5 power. Skin: No rashes, lesions or ulcers Psychiatry: Judgement and insight appear normal. Mood & affect appropriate.     Data Reviewed: I have personally reviewed following labs and imaging studies  CBC:  Recent Labs Lab  05/15/16 0207 05/18/16 1044 05/20/16 0500  WBC 8.4 7.1 9.6  NEUTROABS 4.8 4.3 6.2  HGB 12.7* 12.8* 13.3  HCT 38.3* 39.2 40.4  MCV 81.7 82.7 83.8  PLT 194 171 244   Basic Metabolic Panel:  Recent Labs Lab 05/15/16 0207 05/16/16 0640 05/18/16 1044 05/20/16 0500 05/21/16 0443  NA 136 138 139 141 140  K 5.1 4.6 4.3 4.0 3.6  CL 106 108 106 106 105  CO2 '24 24 23 23 24  '$ GLUCOSE 95 86 79 107* 153*  BUN '6 6 9 11 19  '$ CREATININE 1.35* 1.36* 1.48* 1.36* 1.44*  CALCIUM 8.5* 7.6* 8.1* 8.8* 8.9  MG  --   --   --  1.7  --   PHOS  --   --   --  3.1  --    GFR: Estimated Creatinine Clearance: 50.1 mL/min (by C-G formula based on SCr of 1.44 mg/dL (H)). Liver Function Tests:  Recent Labs Lab 05/15/16 0207 05/18/16 1044 05/20/16 0500  AST 29 16 13*  ALT 23 12* 12*  ALKPHOS 83 73 62  BILITOT 0.5 0.9 0.4  PROT 6.5 6.2* 6.5  ALBUMIN 3.8 3.5 3.5    Recent Labs Lab 05/15/16 0207  LIPASE 11   No results for input(s): AMMONIA in the last 168 hours. Coagulation Profile: No results for input(s): INR, PROTIME in the last 168 hours. Cardiac Enzymes:  Recent Labs Lab 05/15/16 0207  TROPONINI <0.03   BNP (last 3 results) No results for input(s): PROBNP in the last 8760 hours. HbA1C: No results for input(s): HGBA1C in the last 72 hours. CBG:  Recent Labs Lab 05/20/16 1210 05/20/16 1757 05/21/16 0014 05/21/16 0530 05/21/16 1109  GLUCAP 129* 136* 148* 134* 167*   Lipid Profile:  Recent Labs  05/20/16 0500  TRIG 302*   Thyroid Function Tests: No results for input(s): TSH, T4TOTAL, FREET4, T3FREE, THYROIDAB in the last 72 hours. Anemia Panel: No results for input(s): VITAMINB12, FOLATE, FERRITIN, TIBC, IRON, RETICCTPCT in the last 72 hours. Sepsis Labs:  Recent Labs Lab 05/18/16 1209 05/18/16 1437  LATICACIDVEN 0.7 0.8    No results found for this or any previous visit (from the past 240 hour(s)).       Radiology Studies: Dg Chest Port 1  View  Result Date: 05/19/2016 CLINICAL DATA:  Assess position of nasogastric tube EXAM: PORTABLE CHEST 1 VIEW COMPARISON:  Chest x-ray of May 17, 2016 FINDINGS: The nasogastric tube tip lies in the region of the distal stomach. Its course through the thorax appears unchanged. The power port catheter tip projects over the proximal SVC. The lungs are adequately inflated. The heart and pulmonary vascularity are normal. There is tortuosity of the ascending and descending thoracic aorta. IMPRESSION: The esophagogastric tube appears to be in fairly stable position with the tip projecting near the pylorus. Electronically Signed   By: David  Martinique M.D.   On: 05/19/2016 15:38   Dg  Abd 2 Views  Result Date: 05/20/2016 CLINICAL DATA:  Followup small bowel obstruction. EXAM: ABDOMEN - 2 VIEW COMPARISON:  05/15/2016 FINDINGS: The NG tube tip appears to be in the descending duodenum. Interval decrease and small bowel air. There is some persistent air in the cecum. Surgical changes are again demonstrated. The lung bases are grossly clear. Streaky basilar atelectasis and scarring change on the left. IMPRESSION: Improved bowel gas pattern with NG tube in place. Electronically Signed   By: Marijo Sanes M.D.   On: 05/20/2016 09:35        Scheduled Meds: . bisacodyl  10 mg Rectal BID  . enoxaparin  60 mg Subcutaneous QHS  . fat emulsion  250 mL Intravenous Once  . feeding supplement  1 Container Oral TID BM  . levETIRAcetam  750 mg Intravenous Q12H  . polyethylene glycol  17 g Oral Daily  . potassium chloride  10 mEq Intravenous Q1 Hr x 4   Continuous Infusions: . Marland KitchenTPN (CLINIMIX-E) Adult 60 mL/hr at 05/20/16 1904  . Marland KitchenTPN (CLINIMIX-E) Adult       LOS: 6 days     [facility], Harlow Carrizales, MD [facility] Pager [phone]  If 7PM-7AM, please contact night-coverage [email or web address] Password [email or web address] 05/21/2016, 1:01 PM

## 2016-05-21 NOTE — Care Management Note (Signed)
Case Management Note  Patient Details  Name: [name] MRN: [id number] Date of Birth: 08-15-1950  Subjective/Objective:                  Pt admitted with SBO. He is from home, ind with ADL's. He lives in [place]. He had previous SBO with long term TPN in 2017. Pt does not remember which company provided nursing or TPN previously. Pt now with recurrent SBO and on TPN.   Action/Plan: Will cont to follow for DC needs.   Expected Discharge Date:      05/27/2016            Expected Discharge Plan:  Home/Self Care  In-House Referral:  NA  Discharge planning Services  CM Consult  Post Acute Care Choice:  NA Choice offered to:  NA  Status of Service:  In process, will continue to follow  Sherald Barge, RN 05/21/2016, 12:05 PM

## 2016-05-21 NOTE — Progress Notes (Signed)
PHARMACY - ADULT TOTAL PARENTERAL NUTRITION CONSULT NOTE   Pharmacy Consult for TPN Indication: bowel dysfunction with NG tube placement for decompression and pain control  Patient Measurements: Height: '5\' 9"'$  (175.3 cm) Weight: 152 lb 12.8 oz (69.3 kg) IBW/kg (Calculated) : 70.7 TPN AdjBW (KG): 72.6 Body mass index is 22.56 kg/m.   Assessment:  66 yo man with long h/o bowel dysfunction to start TPN. NG tube to be removed today.His bowel dysfunction is slowly resolving.  He is tolerating current TPN.  GI: Had BM, plan to D/C NGTube Endo: Has no h/o DM, cbgs are good with initiation of TPN Lytes: K is 3.6, will replace with IV runs. Ca corrects to 9.2 Renal:  SrCr stable Pulm: Cards:  Hepatobil:TG are 302.  Will continue to monitor.  May need to decrease lipids Neuro: ID:  TPN Access: has PAC placed Nov 2017 TPN start date: 2/28  Nutritional Goals ( per dietary) Kcal: 1900-2100  Protein: 83-90 gr  Current Nutrition: NPO, Clinimix E 5/20 at 60 ml/hr and 20% lipids at 10 ml/hr for 12 hours Tolerating, TPN. Will adjust rate of TPN to meet nutritional goals.  Limiting lipids due to increased TG  Plan:  Potassium runs 37mq/hr x 4 Increase Clinimix E 5/20 at 70 ml/hr Increase 20% lipid emulsion to 10 ml/hr to infuse for 12 hours Add MVI and trace elements to TPN Continue cbg q6 hours.  May dc checks or add ssi as warranted F/u am BMET and RD recommendations  LIsac Sarna BLa Homa BCPS Clinical Pharmacist Pager #[phone]/04/2016,12:15 PM

## 2016-05-21 NOTE — Progress Notes (Addendum)
Nutrition Follow-up   INTERVENTION:  TPN per pharmacy- titrate to meet estimated nutrition needs.   Boost Breeze po TID, each supplement provides 250 kcal and 9 grams of protein    NUTRITION DIAGNOSIS:   Inadequate oral intake related to altered GI function as evidenced by NPO/clear liquids status; progressing   GOAL:   Patient will meet greater than or equal to 90% of their needs- advancing toward goal  MONITOR:   Diet advancement  REASON FOR ASSESSMENT:   Consult New TPN/TNA  ASSESSMENT:  Patient is a 66 yo male with hx of colon cancer, multiple abdominal surgeries,  vitamin B12 and iron deficiency, GERD, recurring bowel obstruction. Pt reports no BM 5 days prior to admiision. He presents with c/o abdominal pain and decreased appetite and intake 2 days prior to ED arrival per MD note.  His home diet is regular. He usually eats eggs, toast breakfast, pasta type meal lunch and meat and veggies in the evening. His usual beverages include tea, water and Gatorade and he takes a MVI daily.  The patient has been seen by surgery this morning and his NGT has been discontinued. His output today 700 ml green fluid. Diet also advanced now  to clear liquids. Will start appropriate oral supplement.   Patient is receiving TNA :  Clinimix  @ 60 ml/hr.  Lipids (20% @ 10 ml/hr)., multivitamins.   Provides 1507 kcal and 72 grams protein daily.    Meets 79% minimum estimated kcal and 87% minimum estimated protein needs.  Nutrition-Focused physical exam completed. Findings are no fat depletion, mild temporal/clavicle muscle depletion, and no edema.    Recent Labs Lab 05/18/16 1044 05/20/16 0500 05/21/16 0443  NA 139 141 140  K 4.3 4.0 3.6  CL 106 106 105  CO2 '23 23 24  '$ BUN '9 11 19  '$ CREATININE 1.48* 1.36* 1.44*  CALCIUM 8.1* 8.8* 8.9  MG  --  1.7  --   PHOS  --  3.1  --   GLUCOSE 79 107* 153*  Labs and Meds reviewed.   Diet Order:  TPN (CLINIMIX-E) Adult Diet clear liquid  Room service appropriate? Yes; Fluid consistency: Thin  Skin:  Reviewed, no issues  Last BM:  3/1 small (type 1)  Height:   Ht Readings from Last 1 Encounters:  05/15/16 '5\' 9"'$  (1.753 m)    Weight:   Wt Readings from Last 1 Encounters:  05/19/16 152 lb 12.8 oz (69.3 kg)    Ideal Body Weight:  73 kg  BMI:  Body mass index is 22.56 kg/m.  Estimated Nutritional Needs:   Kcal:  1900-2100   Protein:  83-90 gr  Fluid:  1.9-2.1 liters daily  EDUCATION NEEDS:   No education needs identified at this time  Colman Cater MS,RD,CSG,LDN Office: #[phone] Pager: [phone]

## 2016-05-21 NOTE — Progress Notes (Signed)
PHARMACY - ADULT TOTAL PARENTERAL NUTRITION CONSULT NOTE   Pharmacy Consult for TPN Indication: bowel dysfunction with NG tube placement for decompression and pain control  Patient Measurements: Height: '5\' 9"'$  (175.3 cm) Weight: 152 lb 12.8 oz (69.3 kg) IBW/kg (Calculated) : 70.7 TPN AdjBW (KG): 72.6 Body mass index is 22.56 kg/m.   Assessment:  66 yo man with long h/o bowel dysfunction to start TPN. NG tube to be removed today.His bowel dysfunction is slowly resolving.  He is tolerating current TPN.  GI: Had BM, plan to D/C NGTube Endo: Has no h/o DM, cbgs are good with initiation of TPN Lytes: K is 3.6, will replace with IV runs. Ca corrects to 9.2 Renal:  SrCr stable Pulm: Cards:  Hepatobil:TG are 302.  Will continue to monitor.  May need to decrease lipids Neuro: ID:  TPN Access: has PAC placed Nov 2017 TPN start date: 2/28  Nutritional Goals ( per dietary) Kcal: 1900-2100  Protein: 83-90 gr  Current Nutrition: NPO, Clinimix E 5/20 at 60 ml/hr and 20% lipids at 10 ml/hr for 12 hours Tolerating, TPN. Will adjust rate of TPN to meet nutritional goals.   Plan:  Potassium runs 74mq/hr x 4 Increase Clinimix E 5/20 at 70 ml/hr Increase 20% lipid emulsion to 20 ml/hr to infuse for 12 hours Add MVI and trace elements to TPN Continue cbg q6 hours.  May dc checks or add ssi as warranted F/u am BMET and RD recommendations  LIsac Sarna BBraxton BCPS Clinical Pharmacist Pager #[phone]/04/2016,11:13 AM

## 2016-05-21 NOTE — Care Management Important Message (Signed)
Important Message  Patient Details  Name: [name] MRN: [id number] Date of Birth: Apr 29, 1950   Medicare Important Message Given:  Yes    Sherald Barge, RN 05/21/2016, 9:34 AM

## 2016-05-21 NOTE — Progress Notes (Signed)
  Subjective: Patient has had a bowel movement. Denies any significant abdominal pain.  Objective: Vital signs in last 24 hours: Temp:  [98.2 F (36.8 C)-98.9 F (37.2 C)] 98.2 F (36.8 C) (03/02 0524) Pulse Rate:  [89-91] 89 (03/02 0524) Resp:  [20] 20 (03/02 0524) BP: (121-137)/(79-86) 121/79 (03/02 0524) SpO2:  [94 %] 94 % (03/02 0524) Last BM Date: 05/20/16  Intake/Output from previous day: 03/01 0701 - 03/02 0700 In: 1954 [I.V.:1854; IV Piggyback:100] Out: 750 [Emesis/NG output:750] Intake/Output this shift: Total I/O In: 0  Out: 850 [Urine:150; Emesis/NG output:700]  General appearance: alert, cooperative and no distress GI: Soft, flat. Occasional bowel sounds appreciated. Nontender.  Lab Results:   Recent Labs  05/20/16 0500  WBC 9.6  HGB 13.3  HCT 40.4  PLT 196   BMET  Recent Labs  05/20/16 0500 05/21/16 0443  NA 141 140  K 4.0 3.6  CL 106 105  CO2 23 24  GLUCOSE 107* 153*  BUN 11 19  CREATININE 1.36* 1.44*  CALCIUM 8.8* 8.9   PT/INR No results for input(s): LABPROT, INR in the last 72 hours.  Studies/Results: Dg Chest Port 1 View  Result Date: 05/19/2016 CLINICAL DATA:  Assess position of nasogastric tube EXAM: PORTABLE CHEST 1 VIEW COMPARISON:  Chest x-ray of May 17, 2016 FINDINGS: The nasogastric tube tip lies in the region of the distal stomach. Its course through the thorax appears unchanged. The power port catheter tip projects over the proximal SVC. The lungs are adequately inflated. The heart and pulmonary vascularity are normal. There is tortuosity of the ascending and descending thoracic aorta. IMPRESSION: The esophagogastric tube appears to be in fairly stable position with the tip projecting near the pylorus. Electronically Signed   By: David  Martinique M.D.   On: 05/19/2016 15:38   Dg Abd 2 Views  Result Date: 05/20/2016 CLINICAL DATA:  Followup small bowel obstruction. EXAM: ABDOMEN - 2 VIEW COMPARISON:  05/15/2016 FINDINGS: The  NG tube tip appears to be in the descending duodenum. Interval decrease and small bowel air. There is some persistent air in the cecum. Surgical changes are again demonstrated. The lung bases are grossly clear. Streaky basilar atelectasis and scarring change on the left. IMPRESSION: Improved bowel gas pattern with NG tube in place. Electronically Signed   By: Marijo Sanes M.D.   On: 05/20/2016 09:35    Anti-infectives: Anti-infectives    None      Assessment/Plan: Impression: Bowel dysfunction, slowly resolving Plan: Will DC NG tube and start clear liquid diet. Patient is on TPN. Patient was encouraged to ambulate.  LOS: 6 days    Aviva Signs 05/21/2016

## 2016-05-22 DIAGNOSIS — I5032 Chronic diastolic (congestive) heart failure: Secondary | ICD-10-CM

## 2016-05-22 LAB — COMPREHENSIVE METABOLIC PANEL
ALBUMIN: 3.2 g/dL — AB (ref 3.5–5.0)
ALT: 10 U/L — ABNORMAL LOW (ref 17–63)
AST: 19 U/L (ref 15–41)
Alkaline Phosphatase: 64 U/L (ref 38–126)
Anion gap: 9 (ref 5–15)
BUN: 24 mg/dL — AB (ref 6–20)
CHLORIDE: 105 mmol/L (ref 101–111)
CO2: 21 mmol/L — AB (ref 22–32)
Calcium: 8.8 mg/dL — ABNORMAL LOW (ref 8.9–10.3)
Creatinine, Ser: 1.36 mg/dL — ABNORMAL HIGH (ref 0.61–1.24)
GFR calc Af Amer: >60 mL/min (ref >60)
GFR calc non Af Amer: 53 mL/min — ABNORMAL LOW (ref >60)
GLUCOSE: 114 mg/dL — AB (ref 65–99)
POTASSIUM: 3.8 mmol/L (ref 3.5–5.1)
Sodium: 135 mmol/L (ref 135–145)
Total Bilirubin: 0.3 mg/dL (ref 0.3–1.2)
Total Protein: 6.3 g/dL — ABNORMAL LOW (ref 6.5–8.1)

## 2016-05-22 LAB — GLUCOSE, CAPILLARY
Glucose-Capillary: 95 mg/dL (ref 65–99)
Glucose-Capillary: 139 mg/dL — ABNORMAL HIGH (ref 65–99)
Glucose-Capillary: 88 mg/dL (ref 65–99)

## 2016-05-22 MED ORDER — LEVETIRACETAM 500 MG PO TABS
750.0000 mg | ORAL_TABLET | Freq: Two times a day (BID) | ORAL | Status: DC
  Administered 2016-05-22 – 2016-05-23 (×2): 750 mg via ORAL
  Filled 2016-05-22 (×2): qty 1

## 2016-05-22 MED ORDER — RISAQUAD PO CAPS
1.0000 | ORAL_CAPSULE | Freq: Every day | ORAL | Status: DC
  Administered 2016-05-22 – 2016-05-23 (×2): 1 via ORAL
  Filled 2016-05-22 (×2): qty 1

## 2016-05-22 MED ORDER — CARVEDILOL 3.125 MG PO TABS
6.2500 mg | ORAL_TABLET | Freq: Two times a day (BID) | ORAL | Status: DC
  Administered 2016-05-22 – 2016-05-23 (×2): 6.25 mg via ORAL
  Filled 2016-05-22 (×2): qty 2

## 2016-05-22 MED ORDER — ATORVASTATIN CALCIUM 20 MG PO TABS
20.0000 mg | ORAL_TABLET | Freq: Every day | ORAL | Status: DC
  Administered 2016-05-22: 20 mg via ORAL
  Filled 2016-05-22: qty 1

## 2016-05-22 MED ORDER — TAMSULOSIN HCL 0.4 MG PO CAPS
0.4000 mg | ORAL_CAPSULE | Freq: Every day | ORAL | Status: DC
  Administered 2016-05-22: 0.4 mg via ORAL
  Filled 2016-05-22: qty 1

## 2016-05-22 MED ORDER — DOCUSATE SODIUM 100 MG PO CAPS
100.0000 mg | ORAL_CAPSULE | Freq: Two times a day (BID) | ORAL | Status: DC
Start: 2016-05-22 — End: 2016-05-23
  Administered 2016-05-22 – 2016-05-23 (×3): 100 mg via ORAL
  Filled 2016-05-22 (×3): qty 1

## 2016-05-22 MED ORDER — PANTOPRAZOLE SODIUM 40 MG PO TBEC
40.0000 mg | DELAYED_RELEASE_TABLET | Freq: Every day | ORAL | Status: DC
  Administered 2016-05-22 – 2016-05-23 (×2): 40 mg via ORAL
  Filled 2016-05-22 (×2): qty 1

## 2016-05-22 NOTE — Progress Notes (Signed)
PROGRESS NOTE    LOEL BETANCUR  AXK:[id number] DOB: Dec 01, 1950 DOA: 05/15/2016 PCP: [name], MD    Brief Narrative:  KENDARIOUS GUDINO is a 66 y.o. 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 Patient with known history of recurrent SBO/ileus, where he required hospitalization at [facility] status post exploratory laparotomy December 2017, with lysis of some adhesions, otherwise no acute findings he was discharged on TPN with gradual advancement of his diet.   Assessment & Plan:   Principal Problem:   Ileus ([place]) Active Problems:   Anemia, chronic disease   Esophageal reflux disease   Chronic abdominal pain   Chronic diastolic heart failure (HCC)   Patient has nasogastric tube   Recurrent SBO - Even though no obstruction seen on abdominal x-ray in ED, patient with known history of recurrent SBO, giving multiple abdominal surgeries most recent exploratory laparotomy at [facility] on September 2018 significant for lysis of some adhesions, required prolonged TPN course then. - Gen. surgery input greatly appreciated, NG discontinued yesterday, no further nausea or vomiting, patient with good bowel movement and passing flatus , will wean TPN by the end of the day . - Advanced from soft to soft diet today . - repeat abdominal x-ray today with improved bowel gas pattern  Iron deficiency anemia - Receiving IV iron and oncology clinic  GERD - Continue with Protonix  Chronic diastolic heart failure - Appears to be euvolemic currently  Hypertension - As human Coreg as tolerating oral intake  Hyperlipidemia - Resume statin as tolerating oral intake  History of PE - Continue with Lovenox, patient reports he has been in current dose since 2011  Hx of colorectal cancer  - Stage III, diagnosed in May 2012  and s/p FOLFOX in 2012  - EGD and colonoscopy reportedly normal in February 2017  - Ongoing management per oncology    History of seizures - Continue with  Keppra, transitioned to by mouth as tolerating oral intake   DVT prophylaxis: Lovenox, he was encouraged to ambulate Code Status: Full code Family Communication: No family bedside  Disposition Plan:  home in 1-2 days    Consultants:   Gen surgery  Procedures:   None  Antimicrobials:   None    Subjective: Patient laying in bed.  Reports mild nausea, but no vomiting or abdominal pain, passing flatus and reports to bowel movement over last 24 hours .  Objective: Vitals:   05/21/16 0524 05/21/16 1309 05/21/16 2104 05/22/16 0500  BP: 121/79 (!) 145/76 111/71 124/73  Pulse: 89 98 86 76  Resp: '20 20 16 20  '$ Temp: 98.2 F (36.8 C) 97.6 F (36.4 C) 98.4 F (36.9 C) 98.3 F (36.8 C)  TempSrc: Oral Oral Oral Oral  SpO2: 94% 94% 95% 97%  Weight:    70 kg (154 lb 6.4 oz)  Height:        Intake/Output Summary (Last 24 hours) at 05/22/16 1037 Last data filed at 05/22/16 0825  Gross per 24 hour  Intake          4762.34 ml  Output              300 ml  Net          4462.34 ml   Filed Weights   05/15/16 0100 05/19/16 1551 05/22/16 0500  Weight: 72.6 kg (160 lb)  69.3 kg (152 lb 12.8 oz) 70 kg (154 lb 6.4 oz)    Examination:  General exam: Appears calm and comfortable  Respiratory system: Clear to auscultation. Respiratory effort normal. Cardiovascular system: S1 & S2 heard, RRR. No JVD, murmurs, rubs, gallops or clicks. No pedal edema. Gastrointestinal system: Numerous surgical scars on abdomen, Abdomen is nondistended, soft and nontender. Normal bowel sounds heard. Central nervous system: Alert and oriented. No focal neurological deficits. Extremities: Symmetric 5 x 5 power. Skin: No rashes, lesions or ulcers Psychiatry: Judgement and insight appear normal. Mood & affect appropriate.     Data Reviewed: I have  personally reviewed following labs and imaging studies  CBC:  Recent Labs Lab 05/18/16 1044 05/20/16 0500  WBC 7.1 9.6  NEUTROABS 4.3 6.2  HGB 12.8* 13.3  HCT 39.2 40.4  MCV 82.7 83.8  PLT 171 458   Basic Metabolic Panel:  Recent Labs Lab 05/16/16 0640 05/18/16 1044 05/20/16 0500 05/21/16 0443 05/22/16 0557  NA 138 139 141 140 135  K 4.6 4.3 4.0 3.6 3.8  CL 108 106 106 105 105  CO2 '24 23 23 24 '$ 21*  GLUCOSE 86 79 107* 153* 114*  BUN '6 9 11 19 '$ 24*  CREATININE 1.36* 1.48* 1.36* 1.44* 1.36*  CALCIUM 7.6* 8.1* 8.8* 8.9 8.8*  MG  --   --  1.7  --   --   PHOS  --   --  3.1  --   --    GFR: Estimated Creatinine Clearance: 53.6 mL/min (by C-G formula based on SCr of 1.36 mg/dL (H)). Liver Function Tests:  Recent Labs Lab 05/18/16 1044 05/20/16 0500 05/22/16 0557  AST 16 13* 19  ALT 12* 12* 10*  ALKPHOS 73 62 64  BILITOT 0.9 0.4 0.3  PROT 6.2* 6.5 6.3*  ALBUMIN 3.5 3.5 3.2*   No results for input(s): LIPASE, AMYLASE in the last 168 hours. No results for input(s): AMMONIA in the last 168 hours. Coagulation Profile: No results for input(s): INR, PROTIME in the last 168 hours. Cardiac Enzymes: No results for input(s): CKTOTAL, CKMB, CKMBINDEX, TROPONINI in the last 168 hours. BNP (last 3 results) No results for input(s): PROBNP in the last 8760 hours. HbA1C: No results for input(s): HGBA1C in the last 72 hours. CBG:  Recent Labs Lab 05/21/16 0530 05/21/16 1109 05/21/16 1809 05/22/16 0001 05/22/16 0542  GLUCAP 134* 167* 113* 146* 95   Lipid Profile:  Recent Labs  05/20/16 0500  TRIG 302*   Thyroid Function Tests: No results for input(s): TSH, T4TOTAL, FREET4, T3FREE, THYROIDAB in the last 72 hours. Anemia Panel: No results for input(s): VITAMINB12, FOLATE, FERRITIN, TIBC, IRON, RETICCTPCT in the last 72 hours. Sepsis Labs:  Recent Labs Lab 05/18/16 1209 05/18/16 1437  LATICACIDVEN 0.7 0.8    No results found for this or any previous visit  (from the past 240 hour(s)).       Radiology Studies: No results found.      Scheduled Meds: . acidophilus  1 capsule Oral Daily  . atorvastatin  20 mg Oral QHS  . bisacodyl  10 mg Rectal BID  . carvedilol  6.25 mg Oral BID WC  . docusate sodium  100 mg Oral BID  . enoxaparin  60 mg Subcutaneous QHS  . feeding supplement  1 Container Oral TID BM  . levETIRAcetam  750 mg Oral BID  . pantoprazole  40 mg Oral Daily  . polyethylene glycol  17 g Oral Daily  . tamsulosin  0.4  mg Oral QHS   Continuous Infusions: . Marland KitchenTPN (CLINIMIX-E) Adult 70 mL/hr at 05/21/16 1729     LOS: 7 days     Phillips Climes, MD [facility] Pager [phone]  If 7PM-7AM, please contact night-coverage [email or web address] Password [facility] 05/22/2016, 10:37 AM

## 2016-05-22 NOTE — Progress Notes (Signed)
  Subjective: Patient continues to have flatus and bowel movements.  Objective: Vital signs in last 24 hours: Temp:  [97.6 F (36.4 C)-98.4 F (36.9 C)] 98.3 F (36.8 C) (03/03 0500) Pulse Rate:  [76-98] 76 (03/03 0500) Resp:  [16-20] 20 (03/03 0500) BP: (111-145)/(71-76) 124/73 (03/03 0500) SpO2:  [94 %-97 %] 97 % (03/03 0500) Weight:  [154 lb 6.4 oz (70 kg)] 154 lb 6.4 oz (70 kg) (03/03 0500) Last BM Date: 05/21/16  Intake/Output from previous day: 03/02 0701 - 03/03 0700 In: 4762.3 [P.O.:840; I.V.:3422.3; IV Piggyback:500] Out: 950 [Urine:250; Emesis/NG output:700] Intake/Output this shift: No intake/output data recorded.  General appearance: alert, cooperative and no distress GI: soft, non-tender; bowel sounds normal; no masses,  no organomegaly  Lab Results:   Recent Labs  05/20/16 0500  WBC 9.6  HGB 13.3  HCT 40.4  PLT 196   BMET  Recent Labs  05/21/16 0443 05/22/16 0557  NA 140 135  K 3.6 3.8  CL 105 105  CO2 24 21*  GLUCOSE 153* 114*  BUN 19 24*  CREATININE 1.44* 1.36*  CALCIUM 8.9 8.8*   PT/INR No results for input(s): LABPROT, INR in the last 72 hours.  Studies/Results: Dg Abd 2 Views  Result Date: 05/20/2016 CLINICAL DATA:  Followup small bowel obstruction. EXAM: ABDOMEN - 2 VIEW COMPARISON:  05/15/2016 FINDINGS: The NG tube tip appears to be in the descending duodenum. Interval decrease and small bowel air. There is some persistent air in the cecum. Surgical changes are again demonstrated. The lung bases are grossly clear. Streaky basilar atelectasis and scarring change on the left. IMPRESSION: Improved bowel gas pattern with NG tube in place. Electronically Signed   By: Marijo Sanes M.D.   On: 05/20/2016 09:35    Anti-infectives: Anti-infectives    None      Assessment/Plan: Impression: Bowel dysmotility, resolving. Plan: Will advance to soft diet. Patient reports that he was in [facility] for 3 weeks, 2 of which were after  his surgery. Hopefully his dysmotility is improving. Would suggest weaning of TPN.  LOS: 7 days    Aviva Signs 05/22/2016

## 2016-05-22 NOTE — Progress Notes (Signed)
PHARMACY - ADULT TOTAL PARENTERAL NUTRITION CONSULT NOTE   Pharmacy Consult for TPN Indication: bowel dysfunction with NG tube placement for decompression and pain control  Patient Measurements: Height: '5\' 9"'$  (175.3 cm) Weight: 154 lb 6.4 oz (70 kg) IBW/kg (Calculated) : 70.7 TPN AdjBW (KG): 72.6 Body mass index is 22.8 kg/m.   Assessment:  66 yo man with long h/o bowel dysfunction to start TPN. NG tube to be removed today.His bowel dysfunction is slowly resolving.  He is tolerating current TPN. Patient is now having flatus and bowel movements. Diet advanced to soft diet. Will wean TPN off and discontinue after current bag hanging completed at 1800.  GI: Had BM, plan to D/C NGTube Endo: Has no h/o DM, cbgs are good with initiation of TPN Lytes: K is 3.6, will replace with IV runs. Ca corrects to 9.2 Renal:  SrCr stable Pulm: Cards:  Hepatobil:TG are 302.  Will continue to monitor.  May need to decrease lipids Neuro: ID:  TPN Access: has PAC placed Nov 2017 TPN start date: 2/28  Nutritional Goals ( per dietary) Kcal: 1900-2100  Protein: 83-90 gr  Current Nutrition:  Clinimix E 5/20 at 39m/hr and 20% lipids at 10 ml/hr for 12 hours   Plan:  Decrease Clinimix E 5/20 to 358mhr for at least 2 hours prior to discontinuing at 1800 today  LoIsac SarnaBS Pharm D, BCPS Clinical Pharmacist Pager #[phone]/05/2016,10:36 AM

## 2016-05-22 NOTE — Plan of Care (Signed)
Problem: Fluid Volume: Goal: Ability to maintain a balanced intake and output will improve Outcome: Progressing Patient on tpn, but tolerating clear liquids

## 2016-05-23 LAB — GLUCOSE, CAPILLARY: Glucose-Capillary: 89 mg/dL (ref 65–99)

## 2016-05-23 MED ORDER — LORAZEPAM 1 MG PO TABS: 1.0000 mg | ORAL_TABLET | Freq: Three times a day (TID) | ORAL | 0 refills | Status: DC | PRN

## 2016-05-23 MED ORDER — HEPARIN SOD (PORK) LOCK FLUSH 100 UNIT/ML IV SOLN
500.0000 [IU] | INTRAVENOUS | Status: DC | PRN
  Administered 2016-05-23: 500 [IU]
  Filled 2016-05-23: qty 5

## 2016-05-23 MED ORDER — BISACODYL 10 MG RE SUPP: 10.0000 mg | Freq: Every day | RECTAL | 0 refills | Status: DC

## 2016-05-23 NOTE — Progress Notes (Signed)
  Subjective: Patient denies nausea or vomiting. He is having bowel movements. He denies any abdominal pain.  Objective: Vital signs in last 24 hours: Temp:  [97.7 F (36.5 C)-98.1 F (36.7 C)] 98.1 F (36.7 C) (03/04 0517) Pulse Rate:  [80-95] 82 (03/04 0800) Resp:  [18-20] 18 (03/04 0800) BP: (106-120)/(57-76) 106/57 (03/04 0800) SpO2:  [98 %-99 %] 99 % (03/04 0800) Last BM Date: 05/23/16  Intake/Output from previous day: 03/03 0701 - 03/04 0700 In: 600 [P.O.:600] Out: 1500 [Urine:1500] Intake/Output this shift: No intake/output data recorded.  General appearance: alert, cooperative and no distress GI: soft, non-tender; bowel sounds normal; no masses,  no organomegaly  Lab Results:  No results for input(s): WBC, HGB, HCT, PLT in the last 72 hours. BMET  Recent Labs  05/21/16 0443 05/22/16 0557  NA 140 135  K 3.6 3.8  CL 105 105  CO2 24 21*  GLUCOSE 153* 114*  BUN 19 24*  CREATININE 1.44* 1.36*  CALCIUM 8.9 8.8*   PT/INR No results for input(s): LABPROT, INR in the last 72 hours.  Studies/Results: No results found.  Anti-infectives: Anti-infectives    None      Assessment/Plan: Impression: Bowel dysfunction, ileus has resolved. Patient back to baseline. Okay for discharge from surgery standpoint.  LOS: 8 days    Aviva Signs 05/23/2016

## 2016-05-23 NOTE — Discharge Summary (Signed)
[name], is a 66 y.o. male  DOB [birth year]  MRN [id number].  Admission date:  05/15/2016  Admitting Physician  Eber Jones, MD  Discharge Date:  05/23/2016   Primary MD  [name], MD  Recommendations for primary care physician for things to follow:  - please check CBC, bmp during next visit.   Admission Diagnosis  Abdominal distention [R14.0] Ileus (HCC) [K56.7] Nausea and vomiting, intractability of vomiting not specified, unspecified vomiting type [R11.2]   Discharge Diagnosis  Abdominal distention [R14.0] Ileus (HCC) [K56.7] Nausea and vomiting, intractability of vomiting not specified, unspecified vomiting type [R11.2]    Principal Problem:   Ileus ([place]) Active Problems:   Anemia, chronic disease   Esophageal reflux disease   Chronic abdominal pain   Chronic diastolic heart failure ([place])   Patient has nasogastric tube      Past Medical History:  Diagnosis Date  . 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 Anemia   . Anxiety   . Arthritis   . Barrett's esophagus   . Blood transfusion   . Bowel obstruction 05/13/2012   Recurrent  . Bronchitis   . Chest pain at rest   . Chronic abdominal pain   . Erosive esophagitis   . ETOH abuse    quit 03/2010  . GERD (gastroesophageal reflux disease)   . Hx of Clostridium difficile infection 01/2012  . Hypertension   . Ileus ([place])   . Iron deficiency anemia 03/23/2016  . Obstruction of bowel 03/03/14  . Osteoporosis   . Personal history of PE (pulmonary embolism) 10/01/2010  . Pneumonia   . Pulmonary embolism ([place]) 02/2010  . Recurrent upper respiratory infection (URI)   . S/P endoscopy September 28, 2010   erosive reflux esophagitis, Billroth I anatomy  . 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 Seizures ([place])   . Shortness of breath   . Sleep apnea   . TIA (transient ischemic attack) 10/11  . Vitamin  B12 deficiency     Past Surgical History:  Procedure Laterality Date  . ABDOMINAL ADHESION SURGERY  03/04/15   @ [facility]  . ABDOMINAL EXPLORATION SURGERY    . abdominal sugery     for bowel obstruction x 8, all in 1980s, except for one in 07/2010  . APPENDECTOMY  1980s  . Billroth 1 hemigastrectomy  1980s   per patient for benign duodenal tumor  . CARDIAC CATHETERIZATION  07/17/2012  . CHOLECYSTECTOMY  1980s  . COLON SURGERY  May 2012   left hemicolectomy, colon cancer found at time of surgery for bowel obstruction  . COLONOSCOPY  03/18/2011   anastomosis at 35cm. Several adenomatous polyps removed. Sigmoid diverticulosis. Next TCS 02/2013  . COLONOSCOPY N/A 07/24/2012   PTW:SFKCLE post segmental resection with normal-appearing colonic anastomosis aside from an adjacent polyp-removed as described above. Rectal polyp-removed as described above. CT findings appear to have been artifactual. tubular adenomas/prolapsed type polyp.  . COLONOSCOPY N/A 05/15/2015   Procedure: COLONOSCOPY;  Surgeon: Cristopher Estimable  [name], MD;  [facility];  Service: Endoscopy;  Laterality: N/A;  . ESOPHAGOGASTRODUODENOSCOPY  09/28/2010  . ESOPHAGOGASTRODUODENOSCOPY  12/01/2010   Cervical web status post dilation, erosive esophagitis, B1 hemigastrectomy, inflamed anastomosis  . ESOPHAGOGASTRODUODENOSCOPY  04/16/2011   excoriation at GEJ c/w trauma/M-W tear, friable gastric anastomosis, dilation efferent limb  . ESOPHAGOGASTRODUODENOSCOPY N/A 06/03/2014   Dr.[name]- cervcal esopphageal web s/p dilation. abnormal distal esophagus bx= barretts esophagus  . ESOPHAGOGASTRODUODENOSCOPY N/A 05/15/2015   Procedure: ESOPHAGOGASTRODUODENOSCOPY (EGD);  Surgeon: [name], MD;  [facility];  Service: Endoscopy;  Laterality: N/A;  230  . ESOPHAGOGASTRODUODENOSCOPY (EGD) WITH ESOPHAGEAL DILATION  02/25/2012   AXE:NMMHWKGS esophageal web-s/p dilation anddisruption as described above. Status post prior gastric with  Billroth I configuration. Abnormal gastric mucosa at the anastomosis. Gastric biopsy showed mild chronic inflammation but no H. pylori   . HERNIA REPAIR     right inguinal  . MALONEY DILATION N/A 06/03/2014   Procedure: [name] DILATION;  Surgeon: [name], MD;  [facility];  Service: Endoscopy;  Laterality: N/A;  [name] DILATION N/A 05/15/2015   Procedure: [name] DILATION;  Surgeon: [name], MD;  [facility];  Service: Endoscopy;  Laterality: N/A;  . PORTACATH PLACEMENT    . SAVORY DILATION N/A 06/03/2014   Procedure: SAVORY DILATION;  Surgeon: [name], MD;  [facility];  Service: Endoscopy;  Laterality: N/A;       History of present illness and  Hospital Course:     Kindly see H&P for history of present illness and admission details, please review complete Labs, Consult reports and Test reports for all details in brief  HPI  from the history and physical done on the day of admission 05/15/2016 HPI: [name] is a 66 y.o. 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  Patient reports that for the last week and half he has had diarrhea 6-8 times a day.  Then a few days ago he started having pain in his abdomen which progressively worsened.  His appetite decreased and two days prior to admission pain increased.  Pain was mostly constant and initially better with laying down and worsened with sitting up.  Day prior to admission patient was nauseous and vomited a few times. No blood in emesis or diarrhea.  Patient states last SBO was in November of 2017 and that this feels similar to his episodes of SBO. No fevers  or chills, patient does say he has had shortness of breath due to pain from breathing in his abdomen.  He says he occasionally has chest pain.  He reports 6 months of waxing and waning occasional tingling and numbness in his hands.  ED Course: patient seen and evaluated.  CT scan showing possible ileus but no definitive obstruction.  Patient failed PO challenge.     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 Patient with known history of recurrent SBO/ileus, where he required  hospitalization at [facility] status post exploratory laparotomy December 2017, with lysis of some adhesions, otherwise no acute findings he was discharged on TPN with gradual advancement of his diet.  Bowel dysfunction/ileus - Even though no obstruction seen on abdominal x-ray in ED, patient with known history of recurrent SBO, giving multiple abdominal surgeries most recent exploratory laparotomy at [facility] on September 2018 significant for lysis of some adhesions, required prolonged TPN course then. - Gen. surgery input greatly appreciated, surely require NG on ILS, with significant NG output , was nothing by mouth for a few days, so he started on TPN , a shunt ambulated with good bowel regimen, where he started to have good bowel movement and his NG output decreased, so NG was discontinued with no further nausea or vomiting, having flatus with bowel movements with good laxative regimen, tolerating clear liquid diet, advanced to soft diet where he has been tolerating over last 24 hours , then flatus and good bowel movement, he will be discharged today with recommendation to eat smaller meals but more frequent given his history of partial gastrectomy.  Iron deficiency anemia - Receiving IV iron in  oncology  clinic  GERD - Continue with Protonix  Chronic diastolic heart failure - Appears to be euvolemic currently  Hypertension - Continue with Coreg  Hyperlipidemia - Continue with statin  History of PE - Continue with Lovenox, patient reports he has been in current dose since 2011  Hx of colorectal cancer  - Stage III, diagnosed in May 2012 and s/p FOLFOX in 2012  - EGD and colonoscopy reportedly normal in February 2017  - Ongoing management per oncology   History of seizures - Continue with  Keppra,   Discharge Condition:  Stable   Follow UP  Follow-up Information    Ranae Pila, NP Follow up on 07/05/2016.   Specialty:  Gastroenterology Why:  at 11:00 am Contact information: [address] [place] [address] [phone]        CAPLAN,Devonte, MD Follow up in 1 week(s).   Specialty:  Internal Medicine Contact information: [place] [address] [place] [address] [phone]             Discharge Instructions  and  Discharge Medications     Discharge Instructions    Discharge instructions    Complete by:  As directed    Follow with Primary MD [name], MD in 7 days   Get CBC, CMP, checked  by Primary MD next visit.    Activity: As tolerated with Full fall precautions use walker/cane & assistance as needed   Disposition Home    Diet: Soft diet with thin liquid , please eat smaller portions, but more frequent meals   On your next visit with your primary care physician please Get Medicines reviewed and adjusted.   Please request your Prim.MD to go over all Hospital Tests and Procedure/Radiological results at the follow up, please get all Hospital records sent to your Prim MD by signing hospital release before you go home.   If you experience worsening of your admission symptoms, develop shortness of breath, life threatening emergency, suicidal or homicidal thoughts you must seek medical attention immediately by calling  911 or calling your MD immediately  if symptoms less severe.  You Must read complete instructions/literature along with all the possible adverse reactions/side effects for all the Medicines you take and that have been prescribed to you. Take any new Medicines after you have completely understood and accpet all the  possible adverse reactions/side effects.   Do not drive, operating heavy machinery, perform activities at heights, swimming or participation in water activities or provide baby sitting services if your were admitted for syncope or siezures until you have seen by Primary MD or a Neurologist and advised to do so again.  Do not drive when taking Pain medications.    Do not take more than prescribed Pain, Sleep and Anxiety Medications  Special Instructions: If you have smoked or chewed Tobacco  in the last 2 yrs please stop smoking, stop any regular Alcohol  and or any Recreational drug use.  Wear Seat belts while driving.   Please note  You were cared for by a hospitalist during your hospital stay. If you have any questions about your discharge medications or the care you received while you were in the hospital after you are discharged, you can call the unit and asked to speak with the hospitalist on call if the hospitalist that took care of you is not available. Once you are discharged, your primary care physician will handle any further medical issues. Please note that NO REFILLS for any discharge medications will be authorized once you are discharged, as it is imperative that you return to your primary care physician (or establish a relationship with a primary care physician if you do not have one) for your aftercare needs so that they can reassess your need for medications and monitor your lab values.   Increase activity slowly    Complete by:  As directed      Allergies as of 05/23/2016   No Known Allergies     Medication List    STOP taking these medications   acetaminophen  500 MG tablet Commonly known as:  TYLENOL     TAKE these medications   acidophilus Caps capsule Take 1 capsule by mouth daily.   atorvastatin 20 MG tablet Commonly known as:  LIPITOR Take 20 mg by mouth at bedtime.   bisacodyl 10 MG suppository Commonly known as:  DULCOLAX Place 1 suppository (10 mg total) rectally daily.   carvedilol 6.25 MG tablet Commonly known as:  COREG Take 6.25 mg by mouth 2 (two) times daily with a meal.   cholecalciferol 1000 units tablet Commonly known as:  VITAMIN D Take 1,000 Units by mouth daily.   Copper Gluconate 2 MG Caps Take 1 capsule by mouth daily.   cyanocobalamin 1000 MCG/ML injection Commonly known as:  (VITAMIN B-12) Inject 1,000 mcg into the muscle every 30 (thirty) days.   DEXILANT 60 MG capsule Generic drug:  dexlansoprazole Take 60 mg by mouth daily.   docusate sodium 100 MG capsule Commonly known as:  COLACE Take 100 mg by mouth 2 (two) times daily.   enoxaparin 60 MG/0.6ML injection Commonly known as:  LOVENOX Inject 60 mg into the skin at bedtime.   ENSURE PLUS HN Liqd Take 1 Bottle by mouth every other day.   ferrous sulfate 325 (65 FE) MG tablet Take 1 tablet (325 mg total) by mouth 2 (two) times daily with a meal.   levETIRAcetam 750 MG tablet Commonly known as:  KEPPRA Take 750 mg by mouth 2 (two) times daily.   lidocaine-prilocaine cream Commonly known as:  EMLA Apply 1 application topically as needed (prior to accessing port).   LORazepam 1 MG tablet Commonly known as:  ATIVAN Take 1 tablet (1 mg total) by mouth every 8 (eight) hours as needed for anxiety. What changed:  when to take this  MAGNESIUM-OXIDE 400 (241.3 Mg) MG tablet Generic drug:  magnesium oxide Take 400 mg by mouth 2 (two) times daily.   multivitamin with minerals Tabs tablet Take 1 tablet by mouth daily.   ondansetron 4 MG tablet Commonly known as:  ZOFRAN Take 1 tablet (4 mg total) by mouth every 8 (eight) hours as needed  for nausea.   polyethylene glycol packet Commonly known as:  MIRALAX / GLYCOLAX Take 17 g by mouth daily as needed for mild constipation or moderate constipation.   PROAIR HFA 108 (90 Base) MCG/ACT inhaler Generic drug:  albuterol Inhale 1-2 puffs into the lungs every 6 (six) hours as needed for wheezing or shortness of breath.   prochlorperazine 10 MG tablet Commonly known as:  COMPAZINE Take 10 mg by mouth every 6 (six) hours as needed for nausea or vomiting.   promethazine 25 MG tablet Commonly known as:  PHENERGAN Take 1 tablet (25 mg total) by mouth every 6 (six) hours as needed for nausea.   RECLAST 5 MG/100ML Soln injection Generic drug:  zoledronic acid Inject 5 mg into the vein See admin instructions. Pt gets a dose yearly.   tamsulosin 0.4 MG Caps capsule Commonly known as:  FLOMAX Take 0.4 mg by mouth at bedtime.   Vitamin D (Ergocalciferol) 50000 units Caps capsule Commonly known as:  DRISDOL Take 50,000 Units by mouth every Sunday.         Diet and Activity recommendation: See Discharge Instructions above   Consults obtained - General surgery   Major procedures and Radiology Reports - PLEASE review detailed and final reports for all details, in brief -     Dg Chest 2 View  Result Date: 05/15/2016 CLINICAL DATA:  Initial evaluation for shortness of breath for 1 month with left upper abdominal pain for 2 days. EXAM: CHEST  2 VIEW COMPARISON:  Prior radiograph 03/19/2016. FINDINGS: Left-sided Port-A-Cath in place, stable. Cardiac and mediastinal silhouettes are stable in size and contour, and remain within normal limits. Aortic atherosclerosis noted. Surgical clip overlies the left hilum. Lungs normally inflated. Linear opacity within the left perihilar region most compatible with atelectasis and/ or scar. No focal infiltrates. No pulmonary edema or pleural effusion. No pneumothorax. No acute osseous abnormality. IMPRESSION: 1. Left perihilar atelectasis  and/or scar. No other active cardiopulmonary disease identified. 2. Aortic atherosclerosis. Electronically Signed   By: Jeannine Boga M.D.   On: 05/15/2016 02:57   Dg Chest Port 1 View  Result Date: 05/19/2016 CLINICAL DATA:  Assess position of nasogastric tube EXAM: PORTABLE CHEST 1 VIEW COMPARISON:  Chest x-ray of May 17, 2016 FINDINGS: The nasogastric tube tip lies in the region of the distal stomach. Its course through the thorax appears unchanged. The power port catheter tip projects over the proximal SVC. The lungs are adequately inflated. The heart and pulmonary vascularity are normal. There is tortuosity of the ascending and descending thoracic aorta. IMPRESSION: The esophagogastric tube appears to be in fairly stable position with the tip projecting near the pylorus. Electronically Signed   By: David  Martinique M.D.   On: 05/19/2016 15:38   Dg Chest Port 1 View  Result Date: 05/17/2016 CLINICAL DATA:  Nasogastric tube placement. EXAM: PORTABLE CHEST 1 VIEW COMPARISON:  Chest x-ray dated 05/15/2016. FINDINGS: Nasogastric tube appears adequately positioned in the stomach. Left-sided Port-A-Cath is stable in position with tip at the level of the upper SVC. Heart size and mediastinal contours stable. Lungs appear clear. No pleural effusion or pneumothorax seen. IMPRESSION: Nasogastric  tube appears adequately positioned in the stomach. Electronically Signed   By: Franki Cabot M.D.   On: 05/17/2016 19:06   Dg Abd 2 Views  Result Date: 05/20/2016 CLINICAL DATA:  Followup small bowel obstruction. EXAM: ABDOMEN - 2 VIEW COMPARISON:  05/15/2016 FINDINGS: The NG tube tip appears to be in the descending duodenum. Interval decrease and small bowel air. There is some persistent air in the cecum. Surgical changes are again demonstrated. The lung bases are grossly clear. Streaky basilar atelectasis and scarring change on the left. IMPRESSION: Improved bowel gas pattern with NG tube in place.  Electronically Signed   By: Marijo Sanes M.D.   On: 05/20/2016 09:35   Dg Abd 2 Views  Result Date: 05/15/2016 CLINICAL DATA:  Initial evaluation for acute left upper abdominal pain for 2 days. EXAM: ABDOMEN - 2 VIEW COMPARISON:  Prior CT from 03/19/2016. FINDINGS: Residual colon is gas-filled and mildly prominent throughout the abdomen, relatively similar to previous CT, and suspected to be chronic. No abnormal bowel wall thickening. No small bowel dilatation to suggest small bowel obstruction. Scattered surgical clips and suture material present within the upper and left abdomen. No soft tissue mass or abnormal calcification. Visualized osseous structures demonstrate no acute abnormality. IMPRESSION: 1. Mild gaseous distention and prominence of the residual colon, similar relative to prior CT from 03/19/2016, and suspected to be chronic. No radiographic evidence for small bowel obstruction. 2. No other acute abnormality identified within the abdomen. Electronically Signed   By: Jeannine Boga M.D.   On: 05/15/2016 03:00    Micro Results     No results found for this or any previous visit (from the past 240 hour(s)).     Today   Subjective:   Zoey Gilkeson today has no headache,no chest or abdominal pain, has been ambulating in the hallway multiple times with the weakness, reports his passing flatus, multiple bowel movements, no nausea or vomiting . n  Objective:   Blood pressure (!) 106/57, pulse 82, temperature 98.1 F (36.7 C), temperature source Oral, resp. rate 18, height '5\' 9"'$  (1.753 m), weight 70 kg (154 lb 6.4 oz), SpO2 99 %.   Intake/Output Summary (Last 24 hours) at 05/23/16 1135 Last data filed at 05/23/16 0800  Gross per 24 hour  Intake              720 ml  Output             1700 ml  Net             -980 ml    Exam General exam: Appears calm and comfortable  Respiratory system: Clear to auscultation. Respiratory effort normal. Cardiovascular system: S1 &  S2 heard, RRR. No JVD, murmurs, rubs, gallops or clicks. No pedal edema. Gastrointestinal system: Numerous surgical scars on abdomen, Abdomen is nondistended, soft and nontender. Normal bowel sounds heard. Central nervous system: Alert and oriented. No focal neurological deficits. Extremities: Symmetric 5 x 5 power. Skin: No rashes, lesions or ulcers Psychiatry: Judgement and insight appear normal. Mood & affect appropriate.   Data Review   CBC w Diff: Lab Results  Component Value Date   WBC 9.6 05/20/2016   HGB 13.3 05/20/2016   HCT 40.4 05/20/2016   PLT 196 05/20/2016   LYMPHOPCT 23 05/20/2016   BANDSPCT 0 10/07/2012   MONOPCT 10 05/20/2016   EOSPCT 2 05/20/2016   BASOPCT 0 05/20/2016    CMP: Lab Results  Component Value Date   NA 135  05/22/2016   K 3.8 05/22/2016   CL 105 05/22/2016   CO2 21 (L) 05/22/2016   BUN 24 (H) 05/22/2016   CREATININE 1.36 (H) 05/22/2016   PROT 6.3 (L) 05/22/2016   ALBUMIN 3.2 (L) 05/22/2016   BILITOT 0.3 05/22/2016   ALKPHOS 64 05/22/2016   AST 19 05/22/2016   ALT 10 (L) 05/22/2016  .   Total Time in preparing paper work, data evaluation and todays exam - 35 minutes  Senon Nixon M.D on 05/23/2016 at 11:35 AM  [facility]   Office  [phone]

## 2016-05-23 NOTE — Progress Notes (Signed)
Pt's IV catheter removed and intact. Pt's port flushed and deaccessed. Pt's site clean dry and intact. Discharge instructions including  Medications and follow up appointments were reviewed and discussed with patient. Pt verbalized understanding of discharge instructions. All questions were answered and no further questions at this time. Pt in stable condition and in no acute distress at time of discharge. Pt will be escorted by nurse tech.

## 2016-05-23 NOTE — Discharge Instructions (Signed)
Follow with Primary MD [name], MD in 7 days   Get CBC, CMP, checked  by Primary MD next visit.    Activity: As tolerated with Full fall precautions use walker/cane & assistance as needed   Disposition Home    Diet: Soft diet with thin liquid , please eat smaller portions, but more frequent meals   On your next visit with your primary care physician please Get Medicines reviewed and adjusted.   Please request your Prim.MD to go over all Hospital Tests and Procedure/Radiological results at the follow up, please get all Hospital records sent to your Prim MD by signing hospital release before you go home.   If you experience worsening of your admission symptoms, develop shortness of breath, life threatening emergency, suicidal or homicidal thoughts you must seek medical attention immediately by calling 911 or calling your MD immediately  if symptoms less severe.  You Must read complete instructions/literature along with all the possible adverse reactions/side effects for all the Medicines you take and that have been prescribed to you. Take any new Medicines after you have completely understood and accpet all the possible adverse reactions/side effects.   Do not drive, operating heavy machinery, perform activities at heights, swimming or participation in water activities or provide baby sitting services if your were admitted for syncope or siezures until you have seen by Primary MD or a Neurologist and advised to do so again.  Do not drive when taking Pain medications.    Do not take more than prescribed Pain, Sleep and Anxiety Medications  Special Instructions: If you have smoked or chewed Tobacco  in the last 2 yrs please stop smoking, stop any regular Alcohol  and or any Recreational drug use.  Wear Seat belts while driving.   Please note  You were cared for by a hospitalist during your hospital stay. If you have any questions about your discharge medications or the care  you received while you were in the hospital after you are discharged, you can call the unit and asked to speak with the hospitalist on call if the hospitalist that took care of you is not available. Once you are discharged, your primary care physician will handle any further medical issues. Please note that NO REFILLS for any discharge medications will be authorized once you are discharged, as it is imperative that you return to your primary care physician (or establish a relationship with a primary care physician if you do not have one) for your aftercare needs so that they can reassess your need for medications and monitor your lab values.

## 2016-06-11 ENCOUNTER — Encounter: Payer: Self-pay | Admitting: Nurse Practitioner

## 2016-06-11 ENCOUNTER — Encounter (HOSPITAL_BASED_OUTPATIENT_CLINIC_OR_DEPARTMENT_OTHER): Payer: Medicare Other

## 2016-06-11 ENCOUNTER — Encounter (HOSPITAL_COMMUNITY): Payer: Self-pay

## 2016-06-11 ENCOUNTER — Ambulatory Visit (HOSPITAL_COMMUNITY)
Admission: RE | Admit: 2016-06-11 | Discharge: 2016-06-11 | Disposition: A | Discharge status: Home / Self Care | Payer: Medicare Other | Source: Ambulatory Visit | Attending: Nurse Practitioner | Admitting: Nurse Practitioner

## 2016-06-11 ENCOUNTER — Ambulatory Visit (INDEPENDENT_AMBULATORY_CARE_PROVIDER_SITE_OTHER): Payer: Medicare Other | Admitting: Nurse Practitioner

## 2016-06-11 VITALS — BP 136/89 | HR 93 | Temp 97.9°F | Ht 69.0 in | Wt 154.8 lb

## 2016-06-11 DIAGNOSIS — Z85038 Personal history of other malignant neoplasm of large intestine: Secondary | ICD-10-CM

## 2016-06-11 DIAGNOSIS — Z903 Acquired absence of stomach [part of]: Secondary | ICD-10-CM | POA: Insufficient documentation

## 2016-06-11 DIAGNOSIS — I700 Atherosclerosis of aorta: Secondary | ICD-10-CM | POA: Insufficient documentation

## 2016-06-11 DIAGNOSIS — D509 Iron deficiency anemia, unspecified: Secondary | ICD-10-CM

## 2016-06-11 DIAGNOSIS — C189 Malignant neoplasm of colon, unspecified: Secondary | ICD-10-CM

## 2016-06-11 DIAGNOSIS — N200 Calculus of kidney: Secondary | ICD-10-CM | POA: Insufficient documentation

## 2016-06-11 DIAGNOSIS — Z95828 Presence of other vascular implants and grafts: Secondary | ICD-10-CM

## 2016-06-11 DIAGNOSIS — Z9889 Other specified postprocedural states: Secondary | ICD-10-CM | POA: Insufficient documentation

## 2016-06-11 DIAGNOSIS — Z452 Encounter for adjustment and management of vascular access device: Secondary | ICD-10-CM

## 2016-06-11 DIAGNOSIS — R1084 Generalized abdominal pain: Secondary | ICD-10-CM

## 2016-06-11 DIAGNOSIS — K56609 Unspecified intestinal obstruction, unspecified as to partial versus complete obstruction: Secondary | ICD-10-CM

## 2016-06-11 LAB — CBC WITH DIFFERENTIAL/PLATELET
BASOS ABS: 0 {cells}/uL (ref 0–200)
Basophils Relative: 0 %
EOS ABS: 154 {cells}/uL (ref 15–500)
Eosinophils Relative: 2 %
HCT: 43.1 % (ref 38.5–50.0)
HEMOGLOBIN: 14.1 g/dL (ref 13.2–17.1)
Lymphocytes Relative: 26 %
Lymphs Abs: 2002 cells/uL (ref 850–3900)
MCH: 27.7 pg (ref 27.0–33.0)
MCHC: 32.7 g/dL (ref 32.0–36.0)
MCV: 84.7 fL (ref 80.0–100.0)
MONO ABS: 1001 {cells}/uL — AB (ref 200–950)
MONOS PCT: 13 %
NEUTROS ABS: 4543 {cells}/uL (ref 1500–7800)
Neutrophils Relative %: 59 %
PLATELETS: 190 10*3/uL (ref 140–400)
RBC: 5.09 MIL/uL (ref 4.20–5.80)
RDW: 20.5 % — ABNORMAL HIGH (ref 11.0–15.0)
WBC: 7.7 10*3/uL (ref 3.8–10.8)

## 2016-06-11 LAB — COMPREHENSIVE METABOLIC PANEL
ALBUMIN: 3.9 g/dL (ref 3.6–5.1)
ALT: 13 U/L (ref 9–46)
AST: 16 U/L (ref 10–35)
Alkaline Phosphatase: 88 U/L (ref 40–115)
BILIRUBIN TOTAL: 0.4 mg/dL (ref 0.2–1.2)
BUN: 6 mg/dL — ABNORMAL LOW (ref 7–25)
CALCIUM: 8.7 mg/dL (ref 8.6–10.3)
CHLORIDE: 108 mmol/L (ref 98–110)
CO2: 27 mmol/L (ref 20–31)
CREATININE: 1.6 mg/dL — AB (ref 0.70–1.25)
Glucose, Bld: 91 mg/dL (ref 65–99)
Potassium: 4.7 mmol/L (ref 3.5–5.3)
SODIUM: 139 mmol/L (ref 135–146)
TOTAL PROTEIN: 6.4 g/dL (ref 6.1–8.1)

## 2016-06-11 LAB — LIPASE: LIPASE: 21 U/L (ref 7–60)

## 2016-06-11 LAB — FERRITIN: Ferritin: 78 ng/mL (ref 24–336)

## 2016-06-11 LAB — IRON AND TIBC
Iron: 94 ug/dL (ref 45–182)
Saturation Ratios: 30 % (ref 17.9–39.5)
TIBC: 312 ug/dL (ref 250–450)
UIBC: 218 ug/dL

## 2016-06-11 MED ORDER — HEPARIN SOD (PORK) LOCK FLUSH 100 UNIT/ML IV SOLN
500.0000 [IU] | Freq: Once | INTRAVENOUS | Status: AC
  Administered 2016-06-11: 500 [IU] via INTRAVENOUS
  Filled 2016-06-11: qty 5

## 2016-06-11 MED ORDER — SODIUM CHLORIDE 0.9% FLUSH
10.0000 mL | Freq: Once | INTRAVENOUS | Status: AC
  Administered 2016-06-11: 10 mL via INTRAVENOUS

## 2016-06-11 MED ORDER — IOPAMIDOL (ISOVUE-300) INJECTION 61%
80.0000 mL | Freq: Once | INTRAVENOUS | Status: AC | PRN
  Administered 2016-06-11: 80 mL via INTRAVENOUS

## 2016-06-11 NOTE — Assessment & Plan Note (Signed)
History of colon cancer diagnosed in 2012 status post chemotherapy in 2012. Still sees oncology. This history is significant given his symptoms today. Further management as per above.

## 2016-06-11 NOTE — Progress Notes (Signed)
Referring Provider: [name], MD Primary Care Physician:  [name], MD Primary GI:  Dr. [name]  Chief Complaint  Patient presents with  . Follow-up    LUQ pain    HPI:   [name] is a 66 y.o. 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 Continues to follow per oncology. He has a history of chronic ileus and small bowel obstructions. Most recently had abdominal surgery at [facility] consisting of exploratory laparotomy in December 2017 with lysis of adhesions discharged on TPN with gradual advancement of diet. He presented to the ER complaining of significant abdominal pain which is worsening, nausea and vomiting without hematemesis. Abdominal x-ray in the ED was normal but given known history of recurrent small bowel obstructions and multiple abdominal surgeries he was placed on an NG tube on intermittent wall suction with significant NG output, made nothing by mouth for a few days, started TPN. He was ambulated and started to have flatus and bowel movements with decreased NG tube output so his tube was discontinued and no further nausea or vomiting. Bowel function slowly returned and his diet was advanced to liquids and then to soft diet which he tolerated over his last 24 hours in the hospital. He was discharged home with recommendation to eat smaller meals and more frequent meals given history of partial gastrectomy. Recommended outpatient GI follow-up. Recommended outpatient primary care follow-up.  Today he states he's doing fair. Better then when hospitalized. Still having LUQ abdominal pain described as stabbing and occasionally burning. He has chronic pain in that area but not typically this severe. Essentially constant, no better pain then when he was in the hospital. Is having a lot of nausea which is worse then his last couple  days in the hospital. No vomiting. Denies hematochezia, melena. Appetite not good, worsening since day of discharge. Is on a regular diet now "what I can eat, but it's not much." Since getting out of the hopsital has early satiety "I can only eat about a half a sandwich and I'm full." His bowel movements are mostly diarrhea which he feels is less formed then the day of discharge. Has some shortness of breath, feels "pretty light headed" today. Denies chest pain, dizziness, syncope, near syncope. Denies any other upper or lower GI symptoms.  Past Medical History:  Diagnosis Date  . 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 Anemia   . Anxiety   . Arthritis   . Barrett's esophagus   . Blood transfusion   . Bowel obstruction 05/13/2012   Recurrent  . Bronchitis   . Chest pain at rest   . Chronic abdominal pain   . Erosive esophagitis   . ETOH abuse    quit 03/2010  . GERD (gastroesophageal reflux disease)   . Hx of Clostridium difficile infection 01/2012  . Hypertension   . Ileus ([place])   . Iron deficiency anemia 03/23/2016  . Obstruction of bowel 03/03/14  . Osteoporosis   . Personal history of PE (pulmonary embolism) 10/01/2010  . Pneumonia   . Pulmonary embolism ([place]) 02/2010  . Recurrent upper respiratory infection (URI)   . S/P endoscopy September 28, 2010   erosive reflux esophagitis, Billroth I anatomy  . 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 Seizures ([place])   . Shortness of breath   .  Sleep apnea   . TIA (transient ischemic attack) 10/11  . Vitamin B12 deficiency     Past Surgical History:  Procedure Laterality Date  . ABDOMINAL ADHESION SURGERY  03/04/15   @ [facility]  . ABDOMINAL EXPLORATION SURGERY    . abdominal sugery     for bowel obstruction x 8, all in 1980s, except for one in 07/2010  . APPENDECTOMY  1980s  . Billroth 1 hemigastrectomy  1980s   per patient for benign duodenal tumor  . CARDIAC CATHETERIZATION  07/17/2012  . CHOLECYSTECTOMY  1980s  .  COLON SURGERY  May 2012   left hemicolectomy, colon cancer found at time of surgery for bowel obstruction  . COLONOSCOPY  03/18/2011   anastomosis at 35cm. Several adenomatous polyps removed. Sigmoid diverticulosis. Next TCS 02/2013  . COLONOSCOPY N/A 07/24/2012   NFA:OZHYQM post segmental resection with normal-appearing colonic anastomosis aside from an adjacent polyp-removed as described above. Rectal polyp-removed as described above. CT findings appear to have been artifactual. tubular adenomas/prolapsed type polyp.  . COLONOSCOPY N/A 05/15/2015   Procedure: COLONOSCOPY;  Surgeon: [name], MD;  [facility];  Service: Endoscopy;  Laterality: N/A;  . ESOPHAGOGASTRODUODENOSCOPY  09/28/2010  . ESOPHAGOGASTRODUODENOSCOPY  12/01/2010   Cervical web status post dilation, erosive esophagitis, B1 hemigastrectomy, inflamed anastomosis  . ESOPHAGOGASTRODUODENOSCOPY  04/16/2011   excoriation at GEJ c/w trauma/M-W tear, friable gastric anastomosis, dilation efferent limb  . ESOPHAGOGASTRODUODENOSCOPY N/A 06/03/2014   Dr.[name]- cervcal esopphageal web s/p dilation. abnormal distal esophagus bx= barretts esophagus  . ESOPHAGOGASTRODUODENOSCOPY N/A 05/15/2015   Procedure: ESOPHAGOGASTRODUODENOSCOPY (EGD);  Surgeon: [name], MD;  [facility];  Service: Endoscopy;  Laterality: N/A;  230  . ESOPHAGOGASTRODUODENOSCOPY (EGD) WITH ESOPHAGEAL DILATION  02/25/2012   VHQ:IONGEXBM esophageal web-s/p dilation anddisruption as described above. Status post prior gastric with Billroth I configuration. Abnormal gastric mucosa at the anastomosis. Gastric biopsy showed mild chronic inflammation but no H. pylori   . HERNIA REPAIR     right inguinal  . MALONEY DILATION N/A 06/03/2014   Procedure: [name] DILATION;  Surgeon: [name], MD;  [facility];  Service: Endoscopy;  Laterality: N/A;  [name] DILATION N/A 05/15/2015   Procedure: [name] DILATION;  Surgeon: [name],  MD;  [facility];  Service: Endoscopy;  Laterality: N/A;  . PORTACATH PLACEMENT    . SAVORY DILATION N/A 06/03/2014   Procedure: SAVORY DILATION;  Surgeon: [name], MD;  [facility];  Service: Endoscopy;  Laterality: N/A;    Current Outpatient Prescriptions  Medication Sig Dispense Refill  . acetaminophen (TYLENOL) 500 MG tablet Take 500 mg by mouth every 6 (six) hours as needed.    [name] acidophilus (RISAQUAD) CAPS capsule Take 1 capsule by mouth daily.    [name] albuterol (PROAIR HFA) 108 (90 BASE) MCG/ACT inhaler Inhale 1-2 puffs into the lungs every 6 (six) hours as needed for wheezing or shortness of breath.     [name] atorvastatin (LIPITOR) 20 MG tablet Take 20 mg by mouth at bedtime.     . bisacodyl (DULCOLAX) 10 MG suppository Place 1 suppository (10 mg total) rectally daily. (Patient taking differently: Place 10 mg rectally daily as needed. ) 12 suppository 0  . carvedilol (COREG) 6.25 MG tablet Take 6.25 mg by mouth 2 (two) times daily with a meal.    . cholecalciferol (VITAMIN D) 1000 UNITS tablet Take 1,000 Units by mouth daily.     . Copper  Gluconate 2 MG CAPS Take 1 capsule by mouth daily. 30 capsule 5  . cyanocobalamin (,VITAMIN B-12,) 1000 MCG/ML injection Inject 1,000 mcg into the muscle every 30 (thirty) days.    [name] dexlansoprazole (DEXILANT) 60 MG capsule Take 60 mg by mouth daily.    [name] docusate sodium (COLACE) 100 MG capsule Take 100 mg by mouth 2 (two) times daily.    [name] enoxaparin (LOVENOX) 60 MG/0.6ML injection Inject 60 mg into the skin at bedtime.     . ferrous sulfate 325 (65 FE) MG tablet Take 1 tablet (325 mg total) by mouth 2 (two) times daily with a meal. 60 tablet 0  . levETIRAcetam (KEPPRA) 750 MG tablet Take 750 mg by mouth 2 (two) times daily.    [name] lidocaine-prilocaine (EMLA) cream Apply 1 application topically as needed (prior to accessing port).    . LORazepam (ATIVAN) 1 MG tablet Take 1 tablet (1 mg total) by mouth every 8 (eight) hours as  needed for anxiety. 30 tablet 0  . magnesium oxide (MAGNESIUM-OXIDE) 400 (241.3 Mg) MG tablet Take 400 mg by mouth 2 (two) times daily.    . Multiple Vitamin (MULTIVITAMIN WITH MINERALS) TABS tablet Take 1 tablet by mouth daily.    . Nutritional Supplements (ENSURE PLUS HN) LIQD Take 1 Bottle by mouth every other day.    . ondansetron (ZOFRAN) 4 MG tablet Take 1 tablet (4 mg total) by mouth every 8 (eight) hours as needed for nausea. 20 tablet 1  . polyethylene glycol (MIRALAX / GLYCOLAX) packet Take 17 g by mouth daily as needed for mild constipation or moderate constipation.    . prochlorperazine (COMPAZINE) 10 MG tablet Take 10 mg by mouth every 6 (six) hours as needed for nausea or vomiting.     . promethazine (PHENERGAN) 25 MG tablet Take 1 tablet (25 mg total) by mouth every 6 (six) hours as needed for nausea. 10 tablet 0  . sertraline (ZOLOFT) 100 MG tablet Take 150 mg by mouth at bedtime.    . tamsulosin (FLOMAX) 0.4 MG CAPS capsule Take 0.4 mg by mouth at bedtime.     . Vitamin D, Ergocalciferol, (DRISDOL) 50000 units CAPS capsule Take 50,000 Units by mouth every Sunday.    . zoledronic acid (RECLAST) 5 MG/100ML SOLN injection Inject 5 mg into the vein See admin instructions. Pt gets a dose yearly.     No current facility-administered medications for this visit.    Facility-Administered Medications Ordered in Other Visits  Medication Dose Route Frequency Provider Last Rate Last Dose  . heparin lock flush 100 unit/mL  500 Units Intravenous Once [facility], PA-C        Allergies as of 06/11/2016  . (No Known Allergies)    Family History  Problem Relation Age of Onset  . Hypertension Mother   . Arthritis Mother   . Pneumonia Mother   . Hypertension Father   . Heart attack Father   . Colon cancer Neg Hx     Social History   Social History  . Marital status: Married    Spouse name: N/A  . Number of children: 3  . Years of education: N/A   Occupational History  .   [employer]   Social History Main Topics  . Smoking status: Former Smoker    Packs/day: 0.50    Years: 40.00    Types: Cigarettes    Quit date: 12/20/2012  . Smokeless tobacco: Never Used  . Alcohol use No  .  Drug use: No  . Sexual activity: No   Other Topics Concern  . None   Social History Narrative  . None    Review of Systems: General: Negative for anorexia, weight loss, fever, chills, fatigue, weakness. ENT: Negative for hoarseness, difficulty swallowing. CV: Negative for chest pain, angina, palpitations, peripheral edema.  Respiratory: Negative for dyspnea at rest, cough, sputum, wheezing.  GI: See history of present illness. Endo: Negative for unusual weight change.  Heme: Negative for bruising or bleeding. Allergy: Negative for rash or hives.   Physical Exam: BP 136/89   Pulse 93   Temp 97.9 F (36.6 C) (Oral)   Ht '5\' 9"'$  (1.753 m)   Wt 154 lb 12.8 oz (70.2 kg)   BMI 22.86 kg/m  General:   Alert and oriented. Pleasant and cooperative. Well-nourished and well-developed.  Head:  Normocephalic and atraumatic. Eyes:  Without icterus, sclera clear and conjunctiva pink.  Ears:  Normal auditory acuity. Cardiovascular:  S1, S2 present without murmurs appreciated. Extremities without clubbing or edema. Respiratory:  Clear to auscultation bilaterally. No wheezes, rales, or rhonchi. No distress.  Gastrointestinal:  +BS, soft, and non-distended. Left-sided abdominal pain noted on palpation. No HSM noted. No guarding or rebound. No masses appreciated.  Rectal:  Deferred  Musculoskalatal:  Symmetrical without gross deformities. Neurologic:  Alert and oriented x4;  grossly normal neurologically. Psych:  Alert and cooperative. Normal mood and affect. Heme/Lymph/Immune: No excessive bruising noted.    06/11/2016 11:42 AM   Disclaimer: This note was dictated with voice recognition software. Similar sounding words can inadvertently be transcribed and may not be  corrected upon review.

## 2016-06-11 NOTE — Assessment & Plan Note (Signed)
Abdominal pain in association with nausea but no vomiting all of which are worsened since discharge. This is concerning for recurrence of ileus or small bowel obstruction as per above, especially given his history of colon cancer. Further management as per above, return for follow-up based on lab and imaging results.

## 2016-06-11 NOTE — Assessment & Plan Note (Signed)
The patient has a history of multiple and frequent small bowel obstructions. Was recently admitted with an SBO and even no abdominal x-ray did not show evidence of small bowel traction or ileus when an NG tube was placed he had significant output. Prior to presentation he also noted nausea and vomiting as well as abdominal pain which is worsening. Since discharge his symptoms were doing better but he is now having worsening left-sided abdominal pain and nausea despite multiple antiemetics. I am concerned with his history of colon cancer about cancer recurrence versus recurrent small bowel obstruction or ileus. Given his significant abdominal pain, persistent nausea worsened since discharge I will order stat labs including CBC, lipase, CMP as well as a stat CT of the abdomen and pelvis with contrast. I have discussed ER precautions with him including worsening/significant abdominal pain, inability to keep down oral intake, worsening nausea and vomiting. Return for follow-up based on testing results.

## 2016-06-11 NOTE — Patient Instructions (Signed)
1. Have your labs and CT done today 2. As we discussed, if you have severe/worsening abdominal pain, N/V, inability to keep down food/fluids then present to the ER 3. We will call you with results and recommendations for follow-up

## 2016-06-11 NOTE — Progress Notes (Signed)
[name] presented for Portacath access and flush. Proper placement of portacath confirmed by CXR. Portacath located left chest wall accessed with  H 20 needle. Good blood return present. Portacath flushed with 20ml NS and 500U/5ml Heparin and needle removed intact. Procedure without incident. Patient tolerated procedure well.   

## 2016-06-11 NOTE — Patient Instructions (Signed)
[place] at [facility] Discharge Instructions  RECOMMENDATIONS MADE BY THE CONSULTANT AND ANY TEST RESULTS WILL BE SENT TO YOUR REFERRING PHYSICIAN.  Port flush with lab work for iron studies done today. Return as scheduled.  Thank you for choosing [place] at [facility] to provide your oncology and hematology care.  To afford each patient quality time with our provider, please arrive at least 15 minutes before your scheduled appointment time.    If you have a lab appointment with the [facility] please come in thru the  Main Entrance and check in at the main information desk  You need to re-schedule your appointment should you arrive 10 or more minutes late.  We strive to give you quality time with our providers, and arriving late affects you and other patients whose appointments are after yours.  Also, if you no show three or more times for appointments you may be dismissed from the clinic at the providers discretion.     Again, thank you for choosing [facility].  Our hope is that these requests will decrease the amount of time that you wait before being seen by our physicians.       _____________________________________________________________  Should you have questions after your visit to [facility], please contact our office at [phone] between the hours of 8:30 a.m. and 4:30 p.m.  Voicemails left after 4:30 p.m. will not be returned until the following business day.  For prescription refill requests, have your pharmacy contact our office.       Resources For Cancer Patients and their Caregivers ? American Cancer Society: Can assist with transportation, wigs, general needs, runs Look Good Feel Better.        (504) 261-3885 ? Cancer Care: Provides financial assistance, online support groups, medication/co-pay assistance.  1-800-813-HOPE 404 741 6034) ? [facility] Assists  [place] cancer patients and their families through emotional , educational and financial support.  508 783 4312 ? [place] DSS Where to apply for food stamps, Medicaid and utility assistance. (913)449-1319 ? [facility]: Transportation to medical appointments. 279 811 1043 ? Social Security Administration: May apply for disability if have a Stage IV cancer. 986-453-5542 347-062-2064 ? [employer], Disability and Transit Services: Assists with nutrition, care and transit needs. [place] Support Programs: '@10RELATIVEDAYS'$ @ > Cancer Support Group  2nd Tuesday of the month 1pm-2pm, Journey Room  > Creative Journey  3rd Tuesday of the month 1130am-1pm, Journey Room  > Look Good Feel Better  1st Wednesday of the month 10am-12 noon, Journey Room (Call [place] to register [phone])

## 2016-06-13 ENCOUNTER — Inpatient Hospital Stay (HOSPITAL_COMMUNITY)
Admission: EM | Admit: 2016-06-13 | Discharge: 2016-06-18 | DRG: 389 | Disposition: A | Discharge status: Home / Self Care | Payer: Medicare Other | Attending: Internal Medicine | Admitting: Internal Medicine

## 2016-06-13 ENCOUNTER — Emergency Department (HOSPITAL_COMMUNITY): Payer: Medicare Other

## 2016-06-13 ENCOUNTER — Other Ambulatory Visit: Payer: Self-pay

## 2016-06-13 ENCOUNTER — Encounter (HOSPITAL_COMMUNITY): Payer: Self-pay | Admitting: Emergency Medicine

## 2016-06-13 DIAGNOSIS — Z86711 Personal history of pulmonary embolism: Secondary | ICD-10-CM | POA: Diagnosis not present

## 2016-06-13 DIAGNOSIS — R109 Unspecified abdominal pain: Secondary | ICD-10-CM | POA: Diagnosis present

## 2016-06-13 DIAGNOSIS — N183 Chronic kidney disease, stage 3 unspecified: Secondary | ICD-10-CM | POA: Diagnosis present

## 2016-06-13 DIAGNOSIS — D696 Thrombocytopenia, unspecified: Secondary | ICD-10-CM | POA: Diagnosis present

## 2016-06-13 DIAGNOSIS — F419 Anxiety disorder, unspecified: Secondary | ICD-10-CM | POA: Diagnosis present

## 2016-06-13 DIAGNOSIS — I10 Essential (primary) hypertension: Secondary | ICD-10-CM | POA: Diagnosis present

## 2016-06-13 DIAGNOSIS — Z79899 Other long term (current) drug therapy: Secondary | ICD-10-CM

## 2016-06-13 DIAGNOSIS — Z8719 Personal history of other diseases of the digestive system: Secondary | ICD-10-CM | POA: Diagnosis not present

## 2016-06-13 DIAGNOSIS — E538 Deficiency of other specified B group vitamins: Secondary | ICD-10-CM | POA: Diagnosis present

## 2016-06-13 DIAGNOSIS — Z87891 Personal history of nicotine dependence: Secondary | ICD-10-CM

## 2016-06-13 DIAGNOSIS — F1021 Alcohol dependence, in remission: Secondary | ICD-10-CM | POA: Diagnosis present

## 2016-06-13 DIAGNOSIS — N179 Acute kidney failure, unspecified: Secondary | ICD-10-CM | POA: Diagnosis present

## 2016-06-13 DIAGNOSIS — G4730 Sleep apnea, unspecified: Secondary | ICD-10-CM | POA: Diagnosis present

## 2016-06-13 DIAGNOSIS — M810 Age-related osteoporosis without current pathological fracture: Secondary | ICD-10-CM | POA: Diagnosis present

## 2016-06-13 DIAGNOSIS — I130 Hypertensive heart and chronic kidney disease with heart failure and stage 1 through stage 4 chronic kidney disease, or unspecified chronic kidney disease: Secondary | ICD-10-CM | POA: Diagnosis present

## 2016-06-13 DIAGNOSIS — Z9049 Acquired absence of other specified parts of digestive tract: Secondary | ICD-10-CM

## 2016-06-13 DIAGNOSIS — D509 Iron deficiency anemia, unspecified: Secondary | ICD-10-CM

## 2016-06-13 DIAGNOSIS — K567 Ileus, unspecified: Secondary | ICD-10-CM | POA: Diagnosis present

## 2016-06-13 DIAGNOSIS — M1990 Unspecified osteoarthritis, unspecified site: Secondary | ICD-10-CM | POA: Diagnosis present

## 2016-06-13 DIAGNOSIS — Z8619 Personal history of other infectious and parasitic diseases: Secondary | ICD-10-CM

## 2016-06-13 DIAGNOSIS — Z8673 Personal history of transient ischemic attack (TIA), and cerebral infarction without residual deficits: Secondary | ICD-10-CM

## 2016-06-13 DIAGNOSIS — G8929 Other chronic pain: Secondary | ICD-10-CM | POA: Diagnosis present

## 2016-06-13 DIAGNOSIS — Z85038 Personal history of other malignant neoplasm of large intestine: Secondary | ICD-10-CM | POA: Diagnosis present

## 2016-06-13 DIAGNOSIS — I5032 Chronic diastolic (congestive) heart failure: Secondary | ICD-10-CM | POA: Diagnosis present

## 2016-06-13 DIAGNOSIS — Z903 Acquired absence of stomach [part of]: Secondary | ICD-10-CM

## 2016-06-13 LAB — COMPREHENSIVE METABOLIC PANEL
ALT: 23 U/L (ref 17–63)
AST: 24 U/L (ref 15–41)
Albumin: 3.6 g/dL (ref 3.5–5.0)
Alkaline Phosphatase: 81 U/L (ref 38–126)
Anion gap: 7 (ref 5–15)
BUN: 5 mg/dL — ABNORMAL LOW (ref 6–20)
CO2: 21 mmol/L — ABNORMAL LOW (ref 22–32)
Calcium: 8.1 mg/dL — ABNORMAL LOW (ref 8.9–10.3)
Chloride: 111 mmol/L (ref 101–111)
Creatinine, Ser: 1.36 mg/dL — ABNORMAL HIGH (ref 0.61–1.24)
GFR calc Af Amer: >60 mL/min (ref >60)
GFR calc non Af Amer: 53 mL/min — ABNORMAL LOW (ref >60)
Glucose, Bld: 89 mg/dL (ref 65–99)
Potassium: 4.5 mmol/L (ref 3.5–5.1)
Sodium: 139 mmol/L (ref 135–145)
Total Bilirubin: 0.4 mg/dL (ref 0.3–1.2)
Total Protein: 6.2 g/dL — ABNORMAL LOW (ref 6.5–8.1)

## 2016-06-13 LAB — CBC
HCT: 39.7 % (ref 39.0–52.0)
Hemoglobin: 13.3 g/dL (ref 13.0–17.0)
MCH: 28.2 pg (ref 26.0–34.0)
MCHC: 33.5 g/dL (ref 30.0–36.0)
MCV: 84.1 fL (ref 78.0–100.0)
Platelets: 158 10*3/uL (ref 150–400)
RBC: 4.72 MIL/uL (ref 4.22–5.81)
RDW: 19.6 % — ABNORMAL HIGH (ref 11.5–15.5)
WBC: 7.8 10*3/uL (ref 4.0–10.5)

## 2016-06-13 LAB — LIPASE, BLOOD: Lipase: 23 U/L (ref 11–51)

## 2016-06-13 MED ORDER — ONDANSETRON HCL 4 MG/2ML IJ SOLN
4.0000 mg | Freq: Four times a day (QID) | INTRAMUSCULAR | Status: DC | PRN
  Administered 2016-06-14 – 2016-06-18 (×10): 4 mg via INTRAVENOUS
  Filled 2016-06-13 (×11): qty 2

## 2016-06-13 MED ORDER — CARVEDILOL 3.125 MG PO TABS
6.2500 mg | ORAL_TABLET | Freq: Two times a day (BID) | ORAL | Status: DC
  Administered 2016-06-17 – 2016-06-18 (×3): 6.25 mg via ORAL
  Filled 2016-06-13 (×3): qty 2

## 2016-06-13 MED ORDER — SODIUM CHLORIDE 0.9 % IV SOLN
INTRAVENOUS | Status: AC
  Administered 2016-06-13 – 2016-06-14 (×2): via INTRAVENOUS

## 2016-06-13 MED ORDER — MORPHINE SULFATE (PF) 4 MG/ML IV SOLN
6.0000 mg | Freq: Once | INTRAVENOUS | Status: AC
  Administered 2016-06-13: 6 mg via INTRAVENOUS
  Filled 2016-06-13: qty 2

## 2016-06-13 MED ORDER — PROMETHAZINE HCL 25 MG/ML IJ SOLN
12.5000 mg | Freq: Once | INTRAMUSCULAR | Status: AC
  Administered 2016-06-13: 12.5 mg via INTRAVENOUS
  Filled 2016-06-13: qty 1

## 2016-06-13 MED ORDER — SODIUM CHLORIDE 0.9 % IV BOLUS (SEPSIS)
1000.0000 mL | Freq: Once | INTRAVENOUS | Status: AC
  Administered 2016-06-13: 1000 mL via INTRAVENOUS

## 2016-06-13 MED ORDER — KETOROLAC TROMETHAMINE 15 MG/ML IJ SOLN
15.0000 mg | Freq: Four times a day (QID) | INTRAMUSCULAR | Status: DC | PRN
  Administered 2016-06-13 – 2016-06-14 (×3): 15 mg via INTRAVENOUS
  Filled 2016-06-13 (×3): qty 1

## 2016-06-13 MED ORDER — HYDROCODONE-ACETAMINOPHEN 5-325 MG PO TABS
1.0000 | ORAL_TABLET | ORAL | Status: DC | PRN
  Administered 2016-06-14: 2 via ORAL
  Filled 2016-06-13: qty 2

## 2016-06-13 MED ORDER — ENOXAPARIN SODIUM 40 MG/0.4ML ~~LOC~~ SOLN
40.0000 mg | SUBCUTANEOUS | Status: DC
  Administered 2016-06-14: 40 mg via SUBCUTANEOUS
  Filled 2016-06-13: qty 0.4

## 2016-06-13 MED ORDER — ONDANSETRON HCL 4 MG PO TABS: 4.0000 mg | ORAL_TABLET | Freq: Four times a day (QID) | ORAL | Status: DC | PRN

## 2016-06-13 NOTE — ED Notes (Signed)
Pt transported to xray 

## 2016-06-13 NOTE — ED Provider Notes (Signed)
[facility] DEPT Provider Note   CSN: [id number] Arrival date & time: 06/13/16  1508     History   Chief Complaint Chief Complaint  Patient presents with  . Abdominal Pain    HPI JAYION SCHNECK is a 66 y.o. male.  HPI   65 year old male with abdominal pain and nausea/vomiting. Pertinent past medical history significant for multiple recurrent small bowel obstructions and recurrent ileus. He was hospitalized in December at [facility] status post exploratory laparotomy and lysis of adhesions. He had to have TPN during that time. More recently he was admitted at the beginning of this month and discharged on March 4th for similar symptoms. Since this past Thursday has been having upper abdominal pain and nausea/vomiting and diarrhea. States that his current symptoms feel similar to what he has had previously with either an ileus or an obstruction. No fevers or chills. No urinary symptoms. No respiratory complaints.  Past Medical History:  Diagnosis Date  . 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 Anemia   . Anxiety   . Arthritis   . Barrett's esophagus   . Blood transfusion   . Bowel obstruction 05/13/2012   Recurrent  . Bronchitis   . Chest pain at rest   . Chronic abdominal pain   . Erosive esophagitis   . ETOH abuse    quit 03/2010  . GERD (gastroesophageal reflux disease)   . Hx of Clostridium difficile infection 01/2012  . Hypertension   . Ileus ([place])   . Iron deficiency anemia 03/23/2016  . Obstruction of bowel 03/03/14  . Osteoporosis   . Personal history of PE (pulmonary embolism) 10/01/2010  . Pneumonia   . Pulmonary embolism ([place]) 02/2010  . Recurrent upper respiratory infection (URI)   . S/P endoscopy September 28, 2010   erosive reflux esophagitis, Billroth I anatomy  . 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 Seizures ([place])   . Shortness of breath   . Sleep apnea   . TIA (transient ischemic attack) 10/11  . Vitamin B12 deficiency     Patient  Active Problem List   Diagnosis Date Noted  . Patient has nasogastric tube   . Ileus ([place]) 05/15/2016  . Chronic diastolic heart failure ([place]) 05/15/2016  . Iron deficiency anemia 03/23/2016  . Acute renal failure ([place]) 03/19/2016  . Normocytic anemia 03/19/2016  . Hypotension 03/19/2016  . Anemia   . History of colon cancer, stage III   . Hx of colon cancer, stage III   . History of colonic polyps   . Barrett's esophagus without dysplasia   . Dysphagia   . Essential hypertension 11/30/2014  . Dysphagia, pharyngoesophageal phase   . Mucosal abnormality of esophagus   . SBO (small bowel obstruction) 09/17/2013  . Rectal bleeding 07/11/2012  . Bowel obstruction 07/11/2012  . Abnormal CT scan, colon 07/11/2012  . Esophageal dysphagia 02/22/2012  . H/O Clostridium difficile infection 02/22/2012  . Diarrhea 09/10/2011  . Cellulitis 09/10/2011  . Nausea 06/07/2011  . Abdominal distention 06/07/2011  . HTN (hypertension) 06/07/2011  . Partial small bowel obstruction ([place]) 05/31/2011  . GI bleed 05/27/2011  . Coagulopathy ([place]) 05/27/2011  . Gastroenteritis 05/27/2011  . Small bowel obstruction ([place]) 05/13/2011  . Left sided abdominal pain 05/13/2011  . Pulmonary embolism ([place]) 01/17/2011  . Chest pain at rest 01/17/2011  . Pneumonia 12/11/2010  . Hematemesis 12/05/2010  . Chronic abdominal pain 12/05/2010  . Pancytopenia due to antineoplastic chemotherapy ([place]) 12/05/2010  .  Coffee ground emesis 11/29/2010  . Esophageal reflux disease 11/15/2010  . 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 Personal history of PE (pulmonary embolism) 10/01/2010  . Bronchitis, acute 10/01/2010  . Erosive esophagitis 10/01/2010  . Fever chills 09/29/2010  . Bleeding gastrointestinal 09/26/2010  . Supratherapeutic INR 09/26/2010  . Abdominal pain 09/26/2010  . Nausea and vomiting 09/26/2010  . Anemia, chronic disease 09/26/2010  . Adenocarcinoma of colon with mucinous features 09/22/2010     Past Surgical History:  Procedure Laterality Date  . ABDOMINAL ADHESION SURGERY  03/04/15   @ [facility]  . ABDOMINAL EXPLORATION SURGERY    . abdominal sugery     for bowel obstruction x 8, all in 1980s, except for one in 07/2010  . APPENDECTOMY  1980s  . Billroth 1 hemigastrectomy  1980s   per patient for benign duodenal tumor  . CARDIAC CATHETERIZATION  07/17/2012  . CHOLECYSTECTOMY  1980s  . COLON SURGERY  May 2012   left hemicolectomy, colon cancer found at time of surgery for bowel obstruction  . COLONOSCOPY  03/18/2011   anastomosis at 35cm. Several adenomatous polyps removed. Sigmoid diverticulosis. Next TCS 02/2013  . COLONOSCOPY N/A 07/24/2012   ZOX:WRUEAV post segmental resection with normal-appearing colonic anastomosis aside from an adjacent polyp-removed as described above. Rectal polyp-removed as described above. CT findings appear to have been artifactual. tubular adenomas/prolapsed type polyp.  . COLONOSCOPY N/A 05/15/2015   Procedure: COLONOSCOPY;  Surgeon: [name], MD;  Location: [facility];  Service: Endoscopy;  Laterality: N/A;  . ESOPHAGOGASTRODUODENOSCOPY  09/28/2010  . ESOPHAGOGASTRODUODENOSCOPY  12/01/2010   Cervical web status post dilation, erosive esophagitis, B1 hemigastrectomy, inflamed anastomosis  . ESOPHAGOGASTRODUODENOSCOPY  04/16/2011   excoriation at GEJ c/w trauma/M-W tear, friable gastric anastomosis, dilation efferent limb  . ESOPHAGOGASTRODUODENOSCOPY N/A 06/03/2014   Dr.[name]- cervcal esopphageal web s/p dilation. abnormal distal esophagus bx= barretts esophagus  . ESOPHAGOGASTRODUODENOSCOPY N/A 05/15/2015   Procedure: ESOPHAGOGASTRODUODENOSCOPY (EGD);  Surgeon: [name], MD;  Location: [facility];  Service: Endoscopy;  Laterality: N/A;  230  . ESOPHAGOGASTRODUODENOSCOPY (EGD) WITH ESOPHAGEAL DILATION  02/25/2012   WUJ:[id number] esophageal web-s/p dilation anddisruption as described above. Status post prior gastric with Billroth I  configuration. Abnormal gastric mucosa at the anastomosis. Gastric biopsy showed mild chronic inflammation but no H. pylori   . HERNIA REPAIR     right inguinal  . MALONEY DILATION N/A 06/03/2014   Procedure: [name] DILATION;  Surgeon: [name], MD;  Location: [facility];  Service: Endoscopy;  Laterality: N/A;  [name] DILATION N/A 05/15/2015   Procedure: [name] DILATION;  Surgeon: [name], MD;  Location: [facility];  Service: Endoscopy;  Laterality: N/A;  . PORTACATH PLACEMENT    . SAVORY DILATION N/A 06/03/2014   Procedure: SAVORY DILATION;  Surgeon: [name], MD;  Location: [facility];  Service: Endoscopy;  Laterality: N/A;       Home Medications    Prior to Admission medications   Medication Sig Start Date End Date Taking? Authorizing Provider  acetaminophen (TYLENOL) 500 MG tablet Take 500 mg by mouth every 6 (six) hours as needed.    Historical Provider, MD  acidophilus (RISAQUAD) CAPS capsule Take 1 capsule by mouth daily.    Historical Provider, MD  albuterol (PROAIR HFA) 108 (90 BASE) MCG/ACT inhaler Inhale 1-2 puffs into the lungs every 6 (six) hours as needed for wheezing or shortness of breath.     Historical Provider, MD  atorvastatin (LIPITOR)  20 MG tablet Take 20 mg by mouth at bedtime.     Historical Provider, MD  bisacodyl (DULCOLAX) 10 MG suppository Place 1 suppository (10 mg total) rectally daily. Patient taking differently: Place 10 mg rectally daily as needed.  05/23/16   Silver Huguenin Elgergawy, MD  carvedilol (COREG) 6.25 MG tablet Take 6.25 mg by mouth 2 (two) times daily with a meal.    Historical Provider, MD  cholecalciferol (VITAMIN D) 1000 UNITS tablet Take 1,000 Units by mouth daily.     Historical Provider, MD  Copper Gluconate 2 MG CAPS Take 1 capsule by mouth daily. 05/03/16   Baird Cancer, PA-C  cyanocobalamin (,VITAMIN B-12,) 1000 MCG/ML injection Inject 1,000 mcg into the muscle every 30 (thirty) days.    Historical Provider,  MD  dexlansoprazole (DEXILANT) 60 MG capsule Take 60 mg by mouth daily.    Historical Provider, MD  docusate sodium (COLACE) 100 MG capsule Take 100 mg by mouth 2 (two) times daily.    Historical Provider, MD  enoxaparin (LOVENOX) 60 MG/0.6ML injection Inject 60 mg into the skin at bedtime.     Historical Provider, MD  ferrous sulfate 325 (65 FE) MG tablet Take 1 tablet (325 mg total) by mouth 2 (two) times daily with a meal. 03/21/16   Thurnell Lose, MD  levETIRAcetam (KEPPRA) 750 MG tablet Take 750 mg by mouth 2 (two) times daily.    Historical Provider, MD  lidocaine-prilocaine (EMLA) cream Apply 1 application topically as needed (prior to accessing port).    Historical Provider, MD  LORazepam (ATIVAN) 1 MG tablet Take 1 tablet (1 mg total) by mouth every 8 (eight) hours as needed for anxiety. 05/23/16   Silver Huguenin Elgergawy, MD  magnesium oxide (MAGNESIUM-OXIDE) 400 (241.3 Mg) MG tablet Take 400 mg by mouth 2 (two) times daily.    Historical Provider, MD  Multiple Vitamin (MULTIVITAMIN WITH MINERALS) TABS tablet Take 1 tablet by mouth daily.    Historical Provider, MD  Nutritional Supplements (ENSURE PLUS HN) LIQD Take 1 Bottle by mouth every other day.    Historical Provider, MD  ondansetron (ZOFRAN) 4 MG tablet Take 1 tablet (4 mg total) by mouth every 8 (eight) hours as needed for nausea. 02/22/14   Aviva Signs, MD  polyethylene glycol [facility] / Floria Raveling) packet Take 17 g by mouth daily as needed for mild constipation or moderate constipation.    Historical Provider, MD  prochlorperazine (COMPAZINE) 10 MG tablet Take 10 mg by mouth every 6 (six) hours as needed for nausea or vomiting.     Historical Provider, MD  promethazine (PHENERGAN) 25 MG tablet Take 1 tablet (25 mg total) by mouth every 6 (six) hours as needed for nausea. 07/07/15   Davonna Belling, MD  sertraline (ZOLOFT) 100 MG tablet Take 150 mg by mouth at bedtime.    Historical Provider, MD  tamsulosin (FLOMAX) 0.4 MG CAPS capsule  Take 0.4 mg by mouth at bedtime.     Historical Provider, MD  Vitamin D, Ergocalciferol, (DRISDOL) 50000 units CAPS capsule Take 50,000 Units by mouth every Sunday.    Historical Provider, MD  zoledronic acid (RECLAST) 5 MG/100ML SOLN injection Inject 5 mg into the vein See admin instructions. Pt gets a dose yearly.    Historical Provider, MD    Family History Family History  Problem Relation Age of Onset  . Hypertension Mother   . Arthritis Mother   . Pneumonia Mother   . Hypertension Father   . Heart  attack Father   . Colon cancer Neg Hx     Social History Social History  Substance Use Topics  . Smoking status: Former Smoker    Packs/day: 0.50    Years: 40.00    Types: Cigarettes    Quit date: 12/20/2012  . Smokeless tobacco: Never Used  . Alcohol use No     Allergies   Patient has no known allergies.   Review of Systems Review of Systems  All systems reviewed and negative, other than as noted in HPI.  Physical Exam Updated Vital Signs BP (!) 140/96 (BP Location: Right Arm)   Pulse 68   Temp 98.6 F (37 C) (Oral)   Resp 18   Ht '5\' 9"'$  (1.753 m)   Wt 155 lb (70.3 kg)   SpO2 99%   BMI 22.89 kg/m   Physical Exam  Constitutional: He appears well-developed and well-nourished. No distress.  HENT:  Head: Normocephalic and atraumatic.  Eyes: Conjunctivae are normal. Right eye exhibits no discharge. Left eye exhibits no discharge.  Neck: Neck supple.  Cardiovascular: Normal rate, regular rhythm and normal heart sounds.  Exam reveals no gallop and no friction rub.   No murmur heard. Pulmonary/Chest: Effort normal and breath sounds normal. No respiratory distress.  Abdominal: Soft. He exhibits no distension. There is tenderness.  Well-healed surgical scars. Mild distention. Diffuse abdominal tenderness, but focally worse upper abdomen. No rebound or guarding.  Musculoskeletal: He exhibits no edema or tenderness.  Neurological: He is alert.  Skin: Skin is warm and  dry.  Psychiatric: He has a normal mood and affect. His behavior is normal. Thought content normal.  Nursing note and vitals reviewed.    ED Treatments / Results  Labs (all labs ordered are listed, but only abnormal results are displayed) Labs Reviewed  COMPREHENSIVE METABOLIC PANEL - Abnormal; Notable for the following:       Result Value   CO2 21 (*)    BUN 5 (*)    Creatinine, Ser 1.36 (*)    Calcium 8.1 (*)    Total Protein 6.2 (*)    GFR calc non Af Amer 53 (*)    All other components within normal limits  CBC - Abnormal; Notable for the following:    RDW 19.6 (*)    All other components within normal limits  LIPASE, BLOOD  URINALYSIS, ROUTINE W REFLEX MICROSCOPIC    EKG  EKG Interpretation None       Radiology Dg Abdomen 1 View  Result Date: 06/13/2016 CLINICAL DATA:  Acute left upper quadrant abdominal pain. EXAM: ABDOMEN - 1 VIEW COMPARISON:  Radiographs of May 20, 2016. FINDINGS: Mildly dilated small bowel loops are noted which may represent ileus or distal small bowel obstruction. Surgical sutures and clips are noted in the left side of the abdomen. Air-filled colon is noted without significant dilatation. IMPRESSION: Mildly dilated small bowel loops are noted which may represent ileus or distal small bowel obstruction. Continued radiographic follow-up is recommended. Electronically Signed   By: Marijo Conception, M.D.   On: 06/13/2016 20:08    Procedures Procedures (including critical care time)  Medications Ordered in ED Medications  sodium chloride 0.9 % bolus 1,000 mL (1,000 mLs Intravenous New Bag/Given 06/13/16 2015)  morphine 4 MG/ML injection 6 mg (6 mg Intravenous Given 06/13/16 2019)  promethazine (PHENERGAN) injection 12.5 mg (12.5 mg Intravenous Given 06/13/16 2019)     Initial Impression / Assessment and Plan / ED Course  I have reviewed the  triage vital signs and the nursing notes.  Pertinent labs & imaging results that were available during my  care of the patient were reviewed by me and considered in my medical decision making (see chart for details).     66 year old male with abdominal pain and nausea, vomiting and diarrhea. Imaging significant for possible partial small bowel obstruction versus ileus. On history the same. NG tube placed. Fluids and continued symptomatic treatment. Admission.  Final Clinical Impressions(s) / ED Diagnoses   Final diagnoses:  Ileus [facility])    New Prescriptions New Prescriptions   No medications on file     Virgel Manifold, MD 06/13/16 2101

## 2016-06-13 NOTE — H&P (Signed)
History and Physical    [name] ALP:[id number] DOB: Dec 19, 1950 DOA: 06/13/2016  PCP: [name], MD  Patient coming from:  home  Chief Complaint:   Nausea , vomiting  HPI: [name] is a 66 y.o. male with medical history significant of recurrent psbo with multiple abdominal surgeries, h/o colon cancer, comes in with several days of upper abdominal pain with n/v.  No fevers.  Feels similar to his previous psbo.  Has ngt and feels better.  Pt referred for admission for psbo.  Review of Systems: As per HPI otherwise 10 point review of systems negative.   Past Medical History:  Diagnosis Date  . 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 Anemia   . Anxiety   . Arthritis   . Barrett's esophagus   . Blood transfusion   . Bowel obstruction 05/13/2012   Recurrent  . Bronchitis   . Chest pain at rest   . Chronic abdominal pain   . Erosive esophagitis   . ETOH abuse    quit 03/2010  . GERD (gastroesophageal reflux disease)   . Hx of Clostridium difficile infection 01/2012  . Hypertension   . Ileus ([place])   . Iron deficiency anemia 03/23/2016  . Obstruction of bowel 03/03/14  . Osteoporosis   . Personal history of PE (pulmonary embolism) 10/01/2010  . Pneumonia   . Pulmonary embolism ([place]) 02/2010  . Recurrent upper respiratory infection (URI)   . S/P endoscopy September 28, 2010   erosive reflux esophagitis, Billroth I anatomy  . 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 Seizures ([place])   . Shortness of breath   . Sleep apnea   . TIA (transient ischemic attack) 10/11  . Vitamin B12 deficiency     Past Surgical History:  Procedure Laterality Date  . ABDOMINAL ADHESION SURGERY  03/04/15   @ [facility]  . ABDOMINAL EXPLORATION SURGERY    . abdominal sugery     for bowel obstruction x 8, all in 1980s, except for one in 07/2010  . APPENDECTOMY  1980s  . Billroth 1 hemigastrectomy  1980s   per patient for benign duodenal tumor  . CARDIAC CATHETERIZATION   07/17/2012  . CHOLECYSTECTOMY  1980s  . COLON SURGERY  May 2012   left hemicolectomy, colon cancer found at time of surgery for bowel obstruction  . COLONOSCOPY  03/18/2011   anastomosis at 35cm. Several adenomatous polyps removed. Sigmoid diverticulosis. Next TCS 02/2013  . COLONOSCOPY N/A 07/24/2012   DZH:[id number] post segmental resection with normal-appearing colonic anastomosis aside from an adjacent polyp-removed as described above. Rectal polyp-removed as described above. CT findings appear to have been artifactual. tubular adenomas/prolapsed type polyp.  . COLONOSCOPY N/A 05/15/2015   Procedure: COLONOSCOPY;  Surgeon: [name], MD;  [facility];  Service: Endoscopy;  Laterality: N/A;  . ESOPHAGOGASTRODUODENOSCOPY  09/28/2010  . ESOPHAGOGASTRODUODENOSCOPY  12/01/2010   Cervical web status post dilation, erosive esophagitis, B1 hemigastrectomy, inflamed anastomosis  . ESOPHAGOGASTRODUODENOSCOPY  04/16/2011   excoriation at GEJ c/w trauma/M-W tear, friable gastric anastomosis, dilation efferent limb  . ESOPHAGOGASTRODUODENOSCOPY N/A 06/03/2014   Dr.[name]- cervcal esopphageal web s/p dilation. abnormal distal esophagus bx= barretts esophagus  . ESOPHAGOGASTRODUODENOSCOPY N/A 05/15/2015   Procedure: ESOPHAGOGASTRODUODENOSCOPY (EGD);  Surgeon: [name], MD;  [facility];  Service: Endoscopy;  Laterality: N/A;  230  . ESOPHAGOGASTRODUODENOSCOPY (EGD) WITH ESOPHAGEAL DILATION  02/25/2012   AST:[id number] esophageal web-s/p dilation anddisruption as described above. Status post  prior gastric with Billroth I configuration. Abnormal gastric mucosa at the anastomosis. Gastric biopsy showed mild chronic inflammation but no H. pylori   . HERNIA REPAIR     right inguinal  . MALONEY DILATION N/A 06/03/2014   Procedure: [name] DILATION;  Surgeon: [name], MD;  [facility];  Service: Endoscopy;  Laterality: N/A;  [name] DILATION N/A 05/15/2015   Procedure:  [name] DILATION;  Surgeon: [name], MD;  [facility];  Service: Endoscopy;  Laterality: N/A;  . PORTACATH PLACEMENT    . SAVORY DILATION N/A 06/03/2014   Procedure: SAVORY DILATION;  Surgeon: [name], MD;  [facility];  Service: Endoscopy;  Laterality: N/A;     reports that he quit smoking about 3 years ago. His smoking use included Cigarettes. He has a 20.00 pack-year smoking history. He has never used smokeless tobacco. He reports that he does not drink alcohol or use drugs.  No Known Allergies  Family History  Problem Relation Age of Onset  . Hypertension Mother   . Arthritis Mother   . Pneumonia Mother   . Hypertension Father   . Heart attack Father   . Colon cancer Neg Hx     Prior to Admission medications   Medication Sig Start Date End Date Taking? Authorizing Provider  acetaminophen (TYLENOL) 500 MG tablet Take 500 mg by mouth every 6 (six) hours as needed.    Historical Provider, MD  acidophilus (RISAQUAD) CAPS capsule Take 1 capsule by mouth daily.    Historical Provider, MD  albuterol (PROAIR HFA) 108 (90 BASE) MCG/ACT inhaler Inhale 1-2 puffs into the lungs every 6 (six) hours as needed for wheezing or shortness of breath.     Historical Provider, MD  atorvastatin (LIPITOR) 20 MG tablet Take 20 mg by mouth at bedtime.     Historical Provider, MD  bisacodyl (DULCOLAX) 10 MG suppository Place 1 suppository (10 mg total) rectally daily. Patient taking differently: Place 10 mg rectally daily as needed.  05/23/16   Silver Huguenin Elgergawy, MD  carvedilol (COREG) 6.25 MG tablet Take 6.25 mg by mouth 2 (two) times daily with a meal.    Historical Provider, MD  cholecalciferol (VITAMIN D) 1000 UNITS tablet Take 1,000 Units by mouth daily.     Historical Provider, MD  Copper Gluconate 2 MG CAPS Take 1 capsule by mouth daily. 05/03/16   Baird Cancer, PA-C  cyanocobalamin (,VITAMIN B-12,) 1000 MCG/ML injection Inject 1,000 mcg into the muscle every 30  (thirty) days.    Historical Provider, MD  dexlansoprazole (DEXILANT) 60 MG capsule Take 60 mg by mouth daily.    Historical Provider, MD  docusate sodium (COLACE) 100 MG capsule Take 100 mg by mouth 2 (two) times daily.    Historical Provider, MD  enoxaparin (LOVENOX) 60 MG/0.6ML injection Inject 60 mg into the skin at bedtime.     Historical Provider, MD  ferrous sulfate 325 (65 FE) MG tablet Take 1 tablet (325 mg total) by mouth 2 (two) times daily with a meal. 03/21/16   Thurnell Lose, MD  levETIRAcetam (KEPPRA) 750 MG tablet Take 750 mg by mouth 2 (two) times daily.    Historical Provider, MD  lidocaine-prilocaine (EMLA) cream Apply 1 application topically as needed (prior to accessing port).    Historical Provider, MD  LORazepam (ATIVAN) 1 MG tablet Take 1 tablet (1 mg total) by mouth every 8 (eight) hours as needed for anxiety. 05/23/16  Albertine Patricia, MD  magnesium oxide (MAGNESIUM-OXIDE) 400 (241.3 Mg) MG tablet Take 400 mg by mouth 2 (two) times daily.    Historical Provider, MD  Multiple Vitamin (MULTIVITAMIN WITH MINERALS) TABS tablet Take 1 tablet by mouth daily.    Historical Provider, MD  Nutritional Supplements (ENSURE PLUS HN) LIQD Take 1 Bottle by mouth every other day.    Historical Provider, MD  ondansetron (ZOFRAN) 4 MG tablet Take 1 tablet (4 mg total) by mouth every 8 (eight) hours as needed for nausea. 02/22/14   Aviva Signs, MD  polyethylene glycol [facility] / Floria Raveling) packet Take 17 g by mouth daily as needed for mild constipation or moderate constipation.    Historical Provider, MD  prochlorperazine (COMPAZINE) 10 MG tablet Take 10 mg by mouth every 6 (six) hours as needed for nausea or vomiting.     Historical Provider, MD  promethazine (PHENERGAN) 25 MG tablet Take 1 tablet (25 mg total) by mouth every 6 (six) hours as needed for nausea. 07/07/15   Davonna Belling, MD  sertraline (ZOLOFT) 100 MG tablet Take 150 mg by mouth at bedtime.    Historical Provider, MD    tamsulosin (FLOMAX) 0.4 MG CAPS capsule Take 0.4 mg by mouth at bedtime.     Historical Provider, MD  Vitamin D, Ergocalciferol, (DRISDOL) 50000 units CAPS capsule Take 50,000 Units by mouth every Sunday.    Historical Provider, MD  zoledronic acid (RECLAST) 5 MG/100ML SOLN injection Inject 5 mg into the vein See admin instructions. Pt gets a dose yearly.    Historical Provider, MD    Physical Exam: Vitals:   06/13/16 1656 06/13/16 1850 06/13/16 2018 06/13/16 2134  BP:  138/79 (!) 140/96 (!) 152/92  Pulse:  72 68 65  Resp:  '19 18 17  '$ Temp:      TempSrc:      SpO2:  100% 99% 98%  Weight: 70.3 kg (155 lb)     Height: '5\' 9"'$  (1.753 m)       Constitutional: NAD, calm, comfortable Vitals:   06/13/16 1656 06/13/16 1850 06/13/16 2018 06/13/16 2134  BP:  138/79 (!) 140/96 (!) 152/92  Pulse:  72 68 65  Resp:  '19 18 17  '$ Temp:      TempSrc:      SpO2:  100% 99% 98%  Weight: 70.3 kg (155 lb)     Height: '5\' 9"'$  (1.753 m)      Eyes: PERRL, lids and conjunctivae normal ENMT: Mucous membranes are moist. Posterior pharynx clear of any exudate or lesions.Normal dentition.  Neck: normal, supple, no masses, no thyromegaly Respiratory: clear to auscultation bilaterally, no wheezing, no crackles. Normal respiratory effort. No accessory muscle use.  Cardiovascular: Regular rate and rhythm, no murmurs / rubs / gallops. No extremity edema. 2+ pedal pulses. No carotid bruits.  Abdomen: no tenderness, no masses palpated. No hepatosplenomegaly. Bowel sounds positive.  Musculoskeletal: no clubbing / cyanosis. No joint deformity upper and lower extremities. Good ROM, no contractures. Normal muscle tone.  Skin: no rashes, lesions, ulcers. No induration Neurologic: CN 2-12 grossly intact. Sensation intact, DTR normal. Strength 5/5 in all 4.  Psychiatric: Normal judgment and insight. Alert and oriented x 3. Normal mood.    Labs on Admission: I have personally reviewed following labs and imaging  studies  CBC:  Recent Labs Lab 06/11/16 1221 06/13/16 1942  WBC 7.7 7.8  NEUTROABS 4,543  --   HGB 14.1 13.3  HCT 43.1 39.7  MCV 84.7 84.1  PLT 190 431   Basic Metabolic Panel:  Recent Labs Lab 06/11/16 1221 06/13/16 1942  NA 139 139  K 4.7 4.5  CL 108 111  CO2 27 21*  GLUCOSE 91 89  BUN 6* 5*  CREATININE 1.60* 1.36*  CALCIUM 8.7 8.1*   GFR: Estimated Creatinine Clearance: 53.8 mL/min (A) (by C-G formula based on SCr of 1.36 mg/dL (H)). Liver Function Tests:  Recent Labs Lab 06/11/16 1221 06/13/16 1942  AST 16 24  ALT 13 23  ALKPHOS 88 81  BILITOT 0.4 0.4  PROT 6.4 6.2*  ALBUMIN 3.9 3.6    Recent Labs Lab 06/11/16 1221 06/13/16 1942  LIPASE 21 23   Anemia Panel:  Recent Labs  06/11/16 1600  FERRITIN 78  TIBC 312  IRON 94   Radiological Exams on Admission: Dg Abdomen 1 View  Result Date: 06/13/2016 CLINICAL DATA:  66 year old male with left abdominal pain for 3 days. Previous partial gastrectomy and gastrojejunostomy. Enteric tube placement. EXAM: ABDOMEN - 1 VIEW COMPARISON:  1923 hours today. FINDINGS: Portable AP supine view at 2111 hours. Enteric tube has been placed, and courses to the gastric remnant. The side hole is probably at the gastroesophageal junction or just inside the stomach, but the tube likely will not advance farther given the previous postoperative changes. Multiple surgical clips in the bilateral upper quadrants. Superimposed left upper abdomen bowel staple line. Bowel gas pattern is stable from the recent CT Abdomen and Pelvis. No acute osseous abnormality identified. IMPRESSION: 1. Enteric tube tip at the level of the gastric remnant. 2. Stable bowel gas pattern since the CT Abdomen and Pelvis on 06/11/2016. Electronically Signed   By: Genevie Ann M.D.   On: 06/13/2016 21:39   Dg Abdomen 1 View  Result Date: 06/13/2016 CLINICAL DATA:  Acute left upper quadrant abdominal pain. EXAM: ABDOMEN - 1 VIEW COMPARISON:  Radiographs of  May 20, 2016. FINDINGS: Mildly dilated small bowel loops are noted which may represent ileus or distal small bowel obstruction. Surgical sutures and clips are noted in the left side of the abdomen. Air-filled colon is noted without significant dilatation. IMPRESSION: Mildly dilated small bowel loops are noted which may represent ileus or distal small bowel obstruction. Continued radiographic follow-up is recommended. Electronically Signed   By: Marijo Conception, M.D.   On: 06/13/2016 20:08    Assessment/Plan 66 yo male with recurrent psbo  Principal Problem:   Ileus ([facility])/psbo - conservative treatment . Had lysis of adhesions in December.  If does not respond to ngt consider surgery evaluation.  h/o multiple abd surgeries in the past.  Npo x ice/meds  Active Problems:   S/P partial gastrectomy- noted   Personal history of PE (pulmonary embolism)- noted   Chronic abdominal pain- noted   Essential hypertension- stable   History of colon cancer, stage III- in remission over 5 years   Iron deficiency anemia- noted, stable   Chronic diastolic heart failure ([place])- stable   Med rec is incomplete  DVT prophylaxis: lovenox  Code Status:  full Family Communication:  none Disposition Plan:  Per day team Consults called:  none Admission status:   admission   Tanaya Dunigan A MD [facility]  If 7PM-7AM, please contact night-coverage [email or web address] Password [email or web address]  06/13/2016, 10:30 PM

## 2016-06-13 NOTE — ED Triage Notes (Addendum)
Patient c/o left upper abd pain that started on Thursday. Per patient nausea, vomiting, diarrhea, and shortness of breath. Denies any fevers, urinary symptoms, or cough. Patient reports going to GI doctor and having CT and lab work on Friday. Per patient labs where normal and CT had not changed since last exam but was told to come back to ER if it didn't imprve. Patient here in ER 3 weeks ago for particial bowel obstruction.

## 2016-06-14 DIAGNOSIS — N183 Chronic kidney disease, stage 3 unspecified: Secondary | ICD-10-CM | POA: Diagnosis present

## 2016-06-14 DIAGNOSIS — I10 Essential (primary) hypertension: Secondary | ICD-10-CM

## 2016-06-14 DIAGNOSIS — Z86711 Personal history of pulmonary embolism: Secondary | ICD-10-CM

## 2016-06-14 LAB — BASIC METABOLIC PANEL
Anion gap: 4 — ABNORMAL LOW (ref 5–15)
BUN: 6 mg/dL (ref 6–20)
CO2: 22 mmol/L (ref 22–32)
CREATININE: 1.41 mg/dL — AB (ref 0.61–1.24)
Calcium: 7.2 mg/dL — ABNORMAL LOW (ref 8.9–10.3)
Chloride: 113 mmol/L — ABNORMAL HIGH (ref 101–111)
GFR calc Af Amer: 59 mL/min — ABNORMAL LOW (ref >60)
GFR calc non Af Amer: 51 mL/min — ABNORMAL LOW (ref >60)
GLUCOSE: 82 mg/dL (ref 65–99)
POTASSIUM: 4.4 mmol/L (ref 3.5–5.1)
SODIUM: 139 mmol/L (ref 135–145)

## 2016-06-14 LAB — CBC
HCT: 36.5 % — ABNORMAL LOW (ref 39.0–52.0)
Hemoglobin: 12 g/dL — ABNORMAL LOW (ref 13.0–17.0)
MCH: 27.9 pg (ref 26.0–34.0)
MCHC: 32.9 g/dL (ref 30.0–36.0)
MCV: 84.9 fL (ref 78.0–100.0)
PLATELETS: 146 10*3/uL — AB (ref 150–400)
RBC: 4.3 MIL/uL (ref 4.22–5.81)
RDW: 19.6 % — ABNORMAL HIGH (ref 11.5–15.5)
WBC: 7.5 10*3/uL (ref 4.0–10.5)

## 2016-06-14 MED ORDER — MORPHINE SULFATE (PF) 2 MG/ML IV SOLN
2.0000 mg | INTRAVENOUS | Status: DC | PRN
  Administered 2016-06-14 – 2016-06-18 (×31): 2 mg via INTRAVENOUS
  Filled 2016-06-14 (×32): qty 1

## 2016-06-14 MED ORDER — SODIUM CHLORIDE 0.45 % IV SOLN
INTRAVENOUS | Status: DC
  Administered 2016-06-14 – 2016-06-16 (×4): via INTRAVENOUS

## 2016-06-14 MED ORDER — BISACODYL 10 MG RE SUPP
10.0000 mg | Freq: Two times a day (BID) | RECTAL | Status: DC
  Administered 2016-06-14 – 2016-06-15 (×3): 10 mg via RECTAL
  Filled 2016-06-14 (×7): qty 1

## 2016-06-14 MED ORDER — ENOXAPARIN SODIUM 60 MG/0.6ML ~~LOC~~ SOLN
60.0000 mg | SUBCUTANEOUS | Status: DC
  Administered 2016-06-15 – 2016-06-18 (×4): 60 mg via SUBCUTANEOUS
  Filled 2016-06-14 (×4): qty 0.6

## 2016-06-14 NOTE — Progress Notes (Signed)
CC'D TO PCP °

## 2016-06-14 NOTE — Progress Notes (Signed)
PROGRESS NOTE    [name]  ZDG:[id number] DOB: 01-10-1951 DOA: 06/13/2016 PCP: [name], MD    Brief Narrative:  66 y/o male with history of abdominal surgeries, presents with abdominal pain and vomiting and found to have ileus and partial sbo. Admitted for further management.   Assessment & Plan:   Principal Problem:   Ileus ([place]) Active Problems:   S/P partial gastrectomy   Personal history of PE (pulmonary embolism)   Chronic abdominal pain   Essential hypertension   History of colon cancer, stage III   Iron deficiency anemia   Chronic diastolic heart failure ([place])   1. Ileus/partial small bowel obstruction. Patient was admitted with vomiting, abdominal pain. He has not had any bowel movements. Abdominal xray shows mildly dilated small bowel loops which may represent ileus or distal sbo. NG tube has been places. Continue conservative management. Patient will be ambulated. Start on dulcolax suppositories.  2. Previous history of PE. Patient is chronically on lovenox as an outpatient. Will continue home dose.  3. Chronic diastolic chf. Appears compensated at this time.  4. ckd 3. Creatinine appears to be near baseline. Continue to monitor.   DVT prophylaxis: lovenox Code Status: full code Family Communication: no family present Disposition Plan: discharge home once improved   Consultants:     Procedures:     Antimicrobials:       Subjective: Still has abdominal pain, no vomiting since NG tube placed. No bowel movements or passing flatus  Objective: Vitals:   06/13/16 2220 06/13/16 2244 06/14/16 0658 06/14/16 1550  BP:  (!) 156/88 128/76 (!) 143/71  Pulse:  72 69 70  Resp:  '16 18 18  '$ Temp:  97.7 F (36.5 C) 97.6 F (36.4 C) 98.4 F (36.9 C)  TempSrc:  Oral Oral Oral  SpO2:  98% 98% 100%  Weight: 70.8 kg (156 lb)  70.6 kg (155 lb 10.3 oz)   Height: '5\' 9"'$  (1.753 m)       Intake/Output Summary (Last 24 hours) at 06/14/16 1702 Last  data filed at 06/14/16 1400  Gross per 24 hour  Intake           866.25 ml  Output                0 ml  Net           866.25 ml   Filed Weights   06/13/16 1656 06/13/16 2220 06/14/16 0658  Weight: 70.3 kg (155 lb) 70.8 kg (156 lb) 70.6 kg (155 lb 10.3 oz)    Examination:  General exam: Appears calm and comfortable, NG tube in place  Respiratory system: Clear to auscultation. Respiratory effort normal. Cardiovascular system: S1 & S2 heard, RRR. No JVD, murmurs, rubs, gallops or clicks. No pedal edema. Gastrointestinal system: Abdomen is nondistended, soft and nontender. No organomegaly or masses felt. Normal bowel sounds heard. Central nervous system: Alert and oriented. No focal neurological deficits. Extremities: Symmetric 5 x 5 power. Skin: No rashes, lesions or ulcers Psychiatry: Judgement and insight appear normal. Mood & affect appropriate.     Data Reviewed: I have personally reviewed following labs and imaging studies  CBC:  Recent Labs Lab 06/11/16 1221 06/13/16 1942 06/14/16 0518  WBC 7.7 7.8 7.5  NEUTROABS 4,543  --   --   HGB 14.1 13.3 12.0*  HCT 43.1 39.7 36.5*  MCV 84.7 84.1 84.9  PLT 190 158 951*   Basic Metabolic Panel:  Recent Labs Lab 06/11/16 1221 06/13/16 1942  06/14/16 0518  NA 139 139 139  K 4.7 4.5 4.4  CL 108 111 113*  CO2 27 21* 22  GLUCOSE 91 89 82  BUN 6* 5* 6  CREATININE 1.60* 1.36* 1.41*  CALCIUM 8.7 8.1* 7.2*   GFR: Estimated Creatinine Clearance: 52.2 mL/min (A) (by C-G formula based on SCr of 1.41 mg/dL (H)). Liver Function Tests:  Recent Labs Lab 06/11/16 1221 06/13/16 1942  AST 16 24  ALT 13 23  ALKPHOS 88 81  BILITOT 0.4 0.4  PROT 6.4 6.2*  ALBUMIN 3.9 3.6    Recent Labs Lab 06/11/16 1221 06/13/16 1942  LIPASE 21 23   No results for input(s): AMMONIA in the last 168 hours. Coagulation Profile: No results for input(s): INR, PROTIME in the last 168 hours. Cardiac Enzymes: No results for input(s): CKTOTAL,  CKMB, CKMBINDEX, TROPONINI in the last 168 hours. BNP (last 3 results) No results for input(s): PROBNP in the last 8760 hours. HbA1C: No results for input(s): HGBA1C in the last 72 hours. CBG: No results for input(s): GLUCAP in the last 168 hours. Lipid Profile: No results for input(s): CHOL, HDL, LDLCALC, TRIG, CHOLHDL, LDLDIRECT in the last 72 hours. Thyroid Function Tests: No results for input(s): TSH, T4TOTAL, FREET4, T3FREE, THYROIDAB in the last 72 hours. Anemia Panel: No results for input(s): VITAMINB12, FOLATE, FERRITIN, TIBC, IRON, RETICCTPCT in the last 72 hours. Sepsis Labs: No results for input(s): PROCALCITON, LATICACIDVEN in the last 168 hours.  No results found for this or any previous visit (from the past 240 hour(s)).       Radiology Studies: Dg Abdomen 1 View  Result Date: 06/13/2016 CLINICAL DATA:  66 year old male with left abdominal pain for 3 days. Previous partial gastrectomy and gastrojejunostomy. Enteric tube placement. EXAM: ABDOMEN - 1 VIEW COMPARISON:  1923 hours today. FINDINGS: Portable AP supine view at 2111 hours. Enteric tube has been placed, and courses to the gastric remnant. The side hole is probably at the gastroesophageal junction or just inside the stomach, but the tube likely will not advance farther given the previous postoperative changes. Multiple surgical clips in the bilateral upper quadrants. Superimposed left upper abdomen bowel staple line. Bowel gas pattern is stable from the recent CT Abdomen and Pelvis. No acute osseous abnormality identified. IMPRESSION: 1. Enteric tube tip at the level of the gastric remnant. 2. Stable bowel gas pattern since the CT Abdomen and Pelvis on 06/11/2016. Electronically Signed   By: Genevie Ann M.D.   On: 06/13/2016 21:39   Dg Abdomen 1 View  Result Date: 06/13/2016 CLINICAL DATA:  Acute left upper quadrant abdominal pain. EXAM: ABDOMEN - 1 VIEW COMPARISON:  Radiographs of May 20, 2016. FINDINGS: Mildly dilated  small bowel loops are noted which may represent ileus or distal small bowel obstruction. Surgical sutures and clips are noted in the left side of the abdomen. Air-filled colon is noted without significant dilatation. IMPRESSION: Mildly dilated small bowel loops are noted which may represent ileus or distal small bowel obstruction. Continued radiographic follow-up is recommended. Electronically Signed   By: Marijo Conception, M.D.   On: 06/13/2016 20:08        Scheduled Meds: . bisacodyl  10 mg Rectal BID  . carvedilol  6.25 mg Oral BID WC  . [START ON 06/15/2016] enoxaparin (LOVENOX) injection  60 mg Subcutaneous Q24H   Continuous Infusions: . sodium chloride 75 mL/hr at 06/14/16 1335     LOS: 1 day    Time spent: 83mns  Kathie Dike, MD [facility] Pager [phone]  If 7PM-7AM, please contact night-coverage [email or web address] Password [email or web address] 06/14/2016, 5:02 PM

## 2016-06-15 DIAGNOSIS — N183 Chronic kidney disease, stage 3 (moderate): Secondary | ICD-10-CM

## 2016-06-15 LAB — CBC
HCT: 37.7 % — ABNORMAL LOW (ref 39.0–52.0)
Hemoglobin: 12.4 g/dL — ABNORMAL LOW (ref 13.0–17.0)
MCH: 28.1 pg (ref 26.0–34.0)
MCHC: 32.9 g/dL (ref 30.0–36.0)
MCV: 85.3 fL (ref 78.0–100.0)
Platelets: 134 10*3/uL — ABNORMAL LOW (ref 150–400)
RBC: 4.42 MIL/uL (ref 4.22–5.81)
RDW: 19.1 % — AB (ref 11.5–15.5)
WBC: 7.1 10*3/uL (ref 4.0–10.5)

## 2016-06-15 LAB — BASIC METABOLIC PANEL
Anion gap: 7 (ref 5–15)
BUN: 8 mg/dL (ref 6–20)
CALCIUM: 7.6 mg/dL — AB (ref 8.9–10.3)
CO2: 22 mmol/L (ref 22–32)
Chloride: 109 mmol/L (ref 101–111)
Creatinine, Ser: 1.23 mg/dL (ref 0.61–1.24)
GFR calc Af Amer: >60 mL/min (ref >60)
GFR, EST NON AFRICAN AMERICAN: 60 mL/min — AB (ref >60)
GLUCOSE: 77 mg/dL (ref 65–99)
Potassium: 4.4 mmol/L (ref 3.5–5.1)
Sodium: 138 mmol/L (ref 135–145)

## 2016-06-15 NOTE — Progress Notes (Signed)
PROGRESS NOTE    [name]  YWV:[id number] DOB: Apr 14, 1950 DOA: 06/13/2016 PCP: [name], MD    Brief Narrative:  66 y/o male with history of abdominal surgeries, presents with abdominal pain and vomiting and found to have ileus and partial sbo. Admitted for further management.   Assessment & Plan:   Principal Problem:   Ileus ([place]) Active Problems:   S/P partial gastrectomy   Personal history of PE (pulmonary embolism)   Chronic abdominal pain   Essential hypertension   History of colon cancer, stage III   Iron deficiency anemia   Chronic diastolic heart failure (HCC)   CKD (chronic kidney disease) stage 3, GFR 30-59 ml/min   1. Ileus/partial small bowel obstruction. Patient was admitted with vomiting, abdominal pain. He has not had any bowel movements. Abdominal xray shows mildly dilated small bowel loops which may represent ileus or distal sbo. NG tube has been placed. Continue conservative management. Patient will be ambulated. On dulcolax suppositories. Consult general surgery.  2. Previous history of PE. Patient is chronically on lovenox as an outpatient. Will continue home dose.  3. Chronic diastolic chf. Appears compensated at this time.  4. ckd 3. Creatinine appears to be near baseline. Continue to monitor.   DVT prophylaxis: lovenox Code Status: full code Family Communication: no family present Disposition Plan: discharge home once improved   Consultants:     Procedures:     Antimicrobials:       Subjective: Still has output from NG tube. Abdominal pain is better today. No bowel movements. He is passing some flatus  Objective: Vitals:   06/14/16 1550 06/14/16 2030 06/15/16 0600 06/15/16 1432  BP: (!) 143/71 125/74 138/78 (!) 142/77  Pulse: 70 74 74 83  Resp: '18 18 18 18  '$ Temp: 98.4 F (36.9 C) 97.8 F (36.6 C) 98.3 F (36.8 C) 98 F (36.7 C)  TempSrc: Oral Oral Oral Oral  SpO2: 100% 96% 98% 99%  Weight:      Height:         Intake/Output Summary (Last 24 hours) at 06/15/16 1609 Last data filed at 06/15/16 1400  Gross per 24 hour  Intake             1800 ml  Output             1200 ml  Net              600 ml   Filed Weights   06/13/16 1656 06/13/16 2220 06/14/16 0658  Weight: 70.3 kg (155 lb) 70.8 kg (156 lb) 70.6 kg (155 lb 10.3 oz)    Examination:  General exam: Appears calm and comfortable, NG tube in place  Respiratory system: Clear to auscultation. Respiratory effort normal. Cardiovascular system: S1 & S2 heard, RRR. No JVD, murmurs, rubs, gallops or clicks. No pedal edema. Gastrointestinal system: Abdomen is nondistended, soft and nontender. No organomegaly or masses felt. Normal bowel sounds heard. Central nervous system: Alert and oriented. No focal neurological deficits. Extremities: Symmetric 5 x 5 power. Skin: No rashes, lesions or ulcers Psychiatry: Judgement and insight appear normal. Mood & affect appropriate.     Data Reviewed: I have personally reviewed following labs and imaging studies  CBC:  Recent Labs Lab 06/11/16 1221 06/13/16 1942 06/14/16 0518 06/15/16 0636  WBC 7.7 7.8 7.5 7.1  NEUTROABS 4,543  --   --   --   HGB 14.1 13.3 12.0* 12.4*  HCT 43.1 39.7 36.5* 37.7*  MCV 84.7 84.1 84.9  85.3  PLT 190 158 146* 810*   Basic Metabolic Panel:  Recent Labs Lab 06/11/16 1221 06/13/16 1942 06/14/16 0518 06/15/16 0636  NA 139 139 139 138  K 4.7 4.5 4.4 4.4  CL 108 111 113* 109  CO2 27 21* 22 22  GLUCOSE 91 89 82 77  BUN 6* 5* 6 8  CREATININE 1.60* 1.36* 1.41* 1.23  CALCIUM 8.7 8.1* 7.2* 7.6*   GFR: Estimated Creatinine Clearance: 59.8 mL/min (by C-G formula based on SCr of 1.23 mg/dL). Liver Function Tests:  Recent Labs Lab 06/11/16 1221 06/13/16 1942  AST 16 24  ALT 13 23  ALKPHOS 88 81  BILITOT 0.4 0.4  PROT 6.4 6.2*  ALBUMIN 3.9 3.6    Recent Labs Lab 06/11/16 1221 06/13/16 1942  LIPASE 21 23   No results for input(s): AMMONIA in the  last 168 hours. Coagulation Profile: No results for input(s): INR, PROTIME in the last 168 hours. Cardiac Enzymes: No results for input(s): CKTOTAL, CKMB, CKMBINDEX, TROPONINI in the last 168 hours. BNP (last 3 results) No results for input(s): PROBNP in the last 8760 hours. HbA1C: No results for input(s): HGBA1C in the last 72 hours. CBG: No results for input(s): GLUCAP in the last 168 hours. Lipid Profile: No results for input(s): CHOL, HDL, LDLCALC, TRIG, CHOLHDL, LDLDIRECT in the last 72 hours. Thyroid Function Tests: No results for input(s): TSH, T4TOTAL, FREET4, T3FREE, THYROIDAB in the last 72 hours. Anemia Panel: No results for input(s): VITAMINB12, FOLATE, FERRITIN, TIBC, IRON, RETICCTPCT in the last 72 hours. Sepsis Labs: No results for input(s): PROCALCITON, LATICACIDVEN in the last 168 hours.  No results found for this or any previous visit (from the past 240 hour(s)).       Radiology Studies: Dg Abdomen 1 View  Result Date: 06/13/2016 CLINICAL DATA:  66 year old male with left abdominal pain for 3 days. Previous partial gastrectomy and gastrojejunostomy. Enteric tube placement. EXAM: ABDOMEN - 1 VIEW COMPARISON:  1923 hours today. FINDINGS: Portable AP supine view at 2111 hours. Enteric tube has been placed, and courses to the gastric remnant. The side hole is probably at the gastroesophageal junction or just inside the stomach, but the tube likely will not advance farther given the previous postoperative changes. Multiple surgical clips in the bilateral upper quadrants. Superimposed left upper abdomen bowel staple line. Bowel gas pattern is stable from the recent CT Abdomen and Pelvis. No acute osseous abnormality identified. IMPRESSION: 1. Enteric tube tip at the level of the gastric remnant. 2. Stable bowel gas pattern since the CT Abdomen and Pelvis on 06/11/2016. Electronically Signed   By: Genevie Ann M.D.   On: 06/13/2016 21:39   Dg Abdomen 1 View  Result Date:  06/13/2016 CLINICAL DATA:  Acute left upper quadrant abdominal pain. EXAM: ABDOMEN - 1 VIEW COMPARISON:  Radiographs of May 20, 2016. FINDINGS: Mildly dilated small bowel loops are noted which may represent ileus or distal small bowel obstruction. Surgical sutures and clips are noted in the left side of the abdomen. Air-filled colon is noted without significant dilatation. IMPRESSION: Mildly dilated small bowel loops are noted which may represent ileus or distal small bowel obstruction. Continued radiographic follow-up is recommended. Electronically Signed   By: Marijo Conception, M.D.   On: 06/13/2016 20:08        Scheduled Meds: . bisacodyl  10 mg Rectal BID  . carvedilol  6.25 mg Oral BID WC  . enoxaparin (LOVENOX) injection  60 mg Subcutaneous Q24H  Continuous Infusions: . sodium chloride 75 mL/hr at 06/15/16 1218     LOS: 2 days    Time spent: 61mns    MEMON,JEHANZEB, MD [facility] Pager [phone] If 7PM-7AM, please contact night-coverage [email or web address] Password [email or web address]/27/2018, 4:09 PM

## 2016-06-16 DIAGNOSIS — K567 Ileus, unspecified: Principal | ICD-10-CM

## 2016-06-16 MED ORDER — SODIUM CHLORIDE 0.9 % IV SOLN
INTRAVENOUS | Status: DC
  Administered 2016-06-16 (×2): via INTRAVENOUS

## 2016-06-16 NOTE — Consult Note (Signed)
Reason for Consult: Recurrent bowel obstruction Referring Physician: Dr. Akula  [name] is an 65 y.o. male.  HPI: Patient is a 65-year-old white male with a long-standing history of bowel dysfunction, status post multiple abdominal surgeries in the past, the latest of which was done in the fall at [facility]. He presented in February 2018 with a partial bowel obstruction versus ileus. He was treated conservatively with an NG tube and subsequently was discharged home. He presented back to the emergency room with worsening abdominal pain and nausea. An NG tube was placed. Since that time, the patient has minimal abdominal discomfort. He did have a small bowel movement this morning and is passing gas. He denies any fever or chills.  Past Medical History:  Diagnosis Date  . 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 Anemia   . Anxiety   . Arthritis   . Barrett's esophagus   . Blood transfusion   . Bowel obstruction 05/13/2012   Recurrent  . Bronchitis   . Chest pain at rest   . Chronic abdominal pain   . Erosive esophagitis   . ETOH abuse    quit 03/2010  . GERD (gastroesophageal reflux disease)   . Hx of Clostridium difficile infection 01/2012  . Hypertension   . Ileus (HCC)   . Iron deficiency anemia 03/23/2016  . Obstruction of bowel 03/03/14  . Osteoporosis   . Personal history of PE (pulmonary embolism) 10/01/2010  . Pneumonia   . Pulmonary embolism (HCC) 02/2010  . Recurrent upper respiratory infection (URI)   . S/P endoscopy September 28, 2010   erosive reflux esophagitis, Billroth I anatomy  . 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 Seizures (HCC)   . Shortness of breath   . Sleep apnea   . TIA (transient ischemic attack) 10/11  . Vitamin B12 deficiency     Past Surgical History:  Procedure Laterality Date  . ABDOMINAL ADHESION SURGERY  03/04/15   @ [facility]  . ABDOMINAL EXPLORATION SURGERY    . abdominal sugery     for bowel obstruction x 8, all  in 1980s, except for one in 07/2010  . APPENDECTOMY  1980s  . Billroth 1 hemigastrectomy  1980s   per patient for benign duodenal tumor  . CARDIAC CATHETERIZATION  07/17/2012  . CHOLECYSTECTOMY  1980s  . COLON SURGERY  May 2012   left hemicolectomy, colon cancer found at time of surgery for bowel obstruction  . COLONOSCOPY  03/18/2011   anastomosis at 35cm. Several adenomatous polyps removed. Sigmoid diverticulosis. Next TCS 02/2013  . COLONOSCOPY N/A 07/24/2012   RMR:Status post segmental resection with normal-appearing colonic anastomosis aside from an adjacent polyp-removed as described above. Rectal polyp-removed as described above. CT findings appear to have been artifactual. tubular adenomas/prolapsed type polyp.  . COLONOSCOPY N/A 05/15/2015   Procedure: COLONOSCOPY;  Surgeon: Robert M [name], MD;  [facility];  Service: Endoscopy;  Laterality: N/A;  . ESOPHAGOGASTRODUODENOSCOPY  09/28/2010  . ESOPHAGOGASTRODUODENOSCOPY  12/01/2010   Cervical web status post dilation, erosive esophagitis, B1 hemigastrectomy, inflamed anastomosis  . ESOPHAGOGASTRODUODENOSCOPY  04/16/2011   excoriation at GEJ c/w trauma/M-W tear, friable gastric anastomosis, dilation efferent limb  . ESOPHAGOGASTRODUODENOSCOPY N/A 06/03/2014   Dr.[name]- cervcal esopphageal web s/p dilation. abnormal distal esophagus bx= barretts esophagus  . ESOPHAGOGASTRODUODENOSCOPY N/A 05/15/2015   Procedure: ESOPHAGOGASTRODUODENOSCOPY (EGD);  Surgeon: Robert M [name], MD;  Location[facility];  Service: Endoscopy;  Laterality: N/A;  230  .   ESOPHAGOGASTRODUODENOSCOPY (EGD) WITH ESOPHAGEAL DILATION  02/25/2012   RMR:Cervical esophageal web-s/p dilation anddisruption as described above. Status post prior gastric with Billroth I configuration. Abnormal gastric mucosa at the anastomosis. Gastric biopsy showed mild chronic inflammation but no H. pylori   . HERNIA REPAIR     right inguinal  . MALONEY DILATION N/A 06/03/2014    Procedure: MALONEY DILATION;  Surgeon: Robert M [name], MD;  [facility];  Service: Endoscopy;  Laterality: N/A;  . MALONEY DILATION N/A 05/15/2015   Procedure: MALONEY DILATION;  Surgeon: Robert M [name], MD;  [facility];  Service: Endoscopy;  Laterality: N/A;  . PORTACATH PLACEMENT    . SAVORY DILATION N/A 06/03/2014   Procedure: SAVORY DILATION;  Surgeon: Robert M [name], MD;  [facility];  Service: Endoscopy;  Laterality: N/A;    Family History  Problem Relation Age of Onset  . Hypertension Mother   . Arthritis Mother   . Pneumonia Mother   . Hypertension Father   . Heart attack Father   . Colon cancer Neg Hx     Social History:  reports that he quit smoking about 3 years ago. His smoking use included Cigarettes. He has a 20.00 pack-year smoking history. He has never used smokeless tobacco. He reports that he does not drink alcohol or use drugs.  Allergies: No Known Allergies  Medications:  Prior to Admission:  Prescriptions Prior to Admission  Medication Sig Dispense Refill Last Dose  . acetaminophen (TYLENOL) 500 MG tablet Take 500 mg by mouth every 6 (six) hours as needed.   Past Month at Unknown time  . acidophilus (RISAQUAD) CAPS capsule Take 1 capsule by mouth daily.   Past Week at Unknown time  . albuterol (PROAIR HFA) 108 (90 BASE) MCG/ACT inhaler Inhale 1-2 puffs into the lungs every 6 (six) hours as needed for wheezing or shortness of breath.    Past Week at Unknown time  . atorvastatin (LIPITOR) 20 MG tablet Take 20 mg by mouth at bedtime.    06/11/2016  . carvedilol (COREG) 6.25 MG tablet Take 6.25 mg by mouth 2 (two) times daily with a meal.   06/13/2016 at 0900  . cholecalciferol (VITAMIN D) 1000 UNITS tablet Take 1,000 Units by mouth daily.    06/13/2016 at Unknown time  . Copper Gluconate 2 MG CAPS Take 1 capsule by mouth daily. 30 capsule 5 06/13/2016 at Unknown time  . cyanocobalamin (,VITAMIN B-12,) 1000 MCG/ML injection Inject 1,000  mcg into the muscle every 30 (thirty) days.   05/20/2016  . dexlansoprazole (DEXILANT) 60 MG capsule Take 60 mg by mouth daily.   06/13/2016 at Unknown time  . docusate sodium (COLACE) 100 MG capsule Take 100 mg by mouth 2 (two) times daily.   06/13/2016 at Unknown time  . enoxaparin (LOVENOX) 60 MG/0.6ML injection Inject 60 mg into the skin at bedtime.    Past Week at Unknown time  . ferrous sulfate 325 (65 FE) MG tablet Take 1 tablet (325 mg total) by mouth 2 (two) times daily with a meal. 60 tablet 0 06/13/2016 at Unknown time  . levETIRAcetam (KEPPRA) 750 MG tablet Take 750 mg by mouth 2 (two) times daily.   06/13/2016 at Unknown time  . lidocaine-prilocaine (EMLA) cream Apply 1 application topically as needed (prior to accessing port).   06/13/2016 at Unknown time  . LORazepam (ATIVAN) 1 MG tablet Take 1 tablet (1 mg total) by mouth every 8 (eight) hours as needed for   anxiety. 30 tablet 0 Past Week at Unknown time  . magnesium oxide (MAGNESIUM-OXIDE) 400 (241.3 Mg) MG tablet Take 400 mg by mouth 2 (two) times daily.   06/13/2016 at Unknown time  . Multiple Vitamin (MULTIVITAMIN WITH MINERALS) TABS tablet Take 1 tablet by mouth daily.   06/13/2016 at Unknown time  . Nutritional Supplements (ENSURE PLUS HN) LIQD Take 1 Bottle by mouth every other day.   Taking  . ondansetron (ZOFRAN) 4 MG tablet Take 1 tablet (4 mg total) by mouth every 8 (eight) hours as needed for nausea. 20 tablet 1 Past Month at Unknown time  . polyethylene glycol (MIRALAX / GLYCOLAX) packet Take 17 g by mouth daily as needed for mild constipation or moderate constipation.   Past Week at Unknown time  . prochlorperazine (COMPAZINE) 10 MG tablet Take 10 mg by mouth every 6 (six) hours as needed for nausea or vomiting.    Past Month at Unknown time  . promethazine (PHENERGAN) 25 MG tablet Take 1 tablet (25 mg total) by mouth every 6 (six) hours as needed for nausea. 10 tablet 0 Past Month at Unknown time  . tamsulosin (FLOMAX) 0.4 MG CAPS  capsule Take 0.4 mg by mouth at bedtime.    Past Week at Unknown time  . Vitamin D, Ergocalciferol, (DRISDOL) 50000 units CAPS capsule Take 50,000 Units by mouth every Sunday.   06/06/2016  . zoledronic acid (RECLAST) 5 MG/100ML SOLN injection Inject 5 mg into the vein See admin instructions. Pt gets a dose yearly.   02/20/2016  . bisacodyl (DULCOLAX) 10 MG suppository Place 1 suppository (10 mg total) rectally daily. (Patient taking differently: Place 10 mg rectally daily as needed. ) 12 suppository 0 Taking   Scheduled: . bisacodyl  10 mg Rectal BID  . carvedilol  6.25 mg Oral BID WC  . enoxaparin (LOVENOX) injection  60 mg Subcutaneous Q24H    Results for orders placed or performed during the hospital encounter of 06/13/16 (from the past 48 hour(s))  Basic metabolic panel     Status: Abnormal   Collection Time: 06/15/16  6:36 AM  Result Value Ref Range   Sodium 138 135 - 145 mmol/L   Potassium 4.4 3.5 - 5.1 mmol/L   Chloride 109 101 - 111 mmol/L   CO2 22 22 - 32 mmol/L   Glucose, Bld 77 65 - 99 mg/dL   BUN 8 6 - 20 mg/dL   Creatinine, Ser 1.23 0.61 - 1.24 mg/dL   Calcium 7.6 (L) 8.9 - 10.3 mg/dL   GFR calc non Af Amer 60 (L) >60 mL/min   GFR calc Af Amer >60 >60 mL/min    Comment: (NOTE) The eGFR has been calculated using the CKD EPI equation. This calculation has not been validated in all clinical situations. eGFR's persistently <60 mL/min signify possible Chronic Kidney Disease.    Anion gap 7 5 - 15  CBC     Status: Abnormal   Collection Time: 06/15/16  6:36 AM  Result Value Ref Range   WBC 7.1 4.0 - 10.5 K/uL   RBC 4.42 4.22 - 5.81 MIL/uL   Hemoglobin 12.4 (L) 13.0 - 17.0 g/dL   HCT 37.7 (L) 39.0 - 52.0 %   MCV 85.3 78.0 - 100.0 fL   MCH 28.1 26.0 - 34.0 pg   MCHC 32.9 30.0 - 36.0 g/dL   RDW 19.1 (H) 11.5 - 15.5 %   Platelets 134 (L) 150 - 400 K/uL    Comment: SPECIMEN CHECKED   FOR CLOTS LARGE PLATELETS PRESENT PLATELET COUNT CONFIRMED BY SMEAR     No results  found.  ROS:  Pertinent items noted in HPI and remainder of comprehensive ROS otherwise negative.  Blood pressure 139/79, pulse 73, temperature 98.5 F (36.9 C), temperature source Oral, resp. rate 18, height 5' 9" (1.753 m), weight 155 lb 10.3 oz (70.6 kg), SpO2 96 %. Physical Exam: Pleasant white male in no acute distress.  Head is normocephalic, atraumatic. Neck is supple without lymphadenopathy. Heart examination reveals a regular rate and rhythm without S3, S4, murmurs. Chest examination reveals a Port-A-Cath in place in the left upper chest. Lungs clear auscultation with equal breath sounds bilaterally. Abdomen is soft and flat with minimal bowel sounds. Multiple surgical scars are present. No hernias are noted. X-ray results reviewed.  Assessment/Plan: Impression: Recurrent bowel dysfunction, ileus. No need for acute surgical prevention at this time. Continue NG tube decompression for another 24 hours. May remove NG tube tomorrow and advance diet as tolerated. Will follow with you.  Aviva Signs 06/16/2016, 9:15 AM

## 2016-06-16 NOTE — Progress Notes (Signed)
Pt ambulated approx. 150 feet. NG Tube clamped. Tolerated well. Hooking NG tube back up to suction now.

## 2016-06-16 NOTE — Progress Notes (Signed)
PROGRESS NOTE    [name]  BEM:[id number] DOB: 14-Feb-1951 DOA: 06/13/2016 PCP: [name], MD    Brief Narrative:  66 y/o male with history of abdominal surgeries, presents with abdominal pain and vomiting and found to have ileus and partial sbo. Admitted for further management.   Assessment & Plan:   Principal Problem:   Ileus ([place]) Active Problems:   S/P partial gastrectomy   Personal history of PE (pulmonary embolism)   Chronic abdominal pain   Essential hypertension   History of colon cancer, stage III   Iron deficiency anemia   Chronic diastolic heart failure (HCC)   CKD (chronic kidney disease) stage 3, GFR 30-59 ml/min   1. Ileus/partial small bowel obstruction. Patient was admitted with vomiting, abdominal pain. He has not had any bowel movements. Abdominal xray shows mildly dilated small bowel loops which may represent ileus or distal sbo. NG tube has been placed. Continue conservative management. General surgery consulted, recommended to continue with NG tube and evaluate in am.   2. Previous history of PE. Patient is chronically on lovenox as an outpatient. Will continue home dose.  3. Chronic diastolic chf. Appears compensated at this time. No sob.   4.  Acute on chronic Stage 3 CKD: . Creatinine appears to be near baseline. Continue to monitor.  5 mild normocytic anemia; stable.   6. Mild thrombocytopenia: if platelets less than 100,000, will need to stop the lovenox.      DVT prophylaxis: lovenox Code Status: full code Family Communication: no family present Disposition Plan: discharge home when able to take po without symptoms.    Consultants:   surgery  Procedures:   none  Antimicrobials:    none.    Subjective: BM last night, abd pain improved. No nausea or vomiting.  He is passing some flatus  Objective: Vitals:   06/15/16 0600 06/15/16 1432 06/15/16 2112 06/16/16 0642  BP: 138/78 (!) 142/77 (!) 141/77 139/79  Pulse: 74  83 79 73  Resp: '18 18 18 18  '$ Temp: 98.3 F (36.8 C) 98 F (36.7 C) 98.2 F (36.8 C) 98.5 F (36.9 C)  TempSrc: Oral Oral Oral Oral  SpO2: 98% 99% 96% 96%  Weight:      Height:        Intake/Output Summary (Last 24 hours) at 06/16/16 1337 Last data filed at 06/16/16 1237  Gross per 24 hour  Intake              600 ml  Output             1700 ml  Net            -1100 ml   Filed Weights   06/13/16 1656 06/13/16 2220 06/14/16 0658  Weight: 70.3 kg (155 lb) 70.8 kg (156 lb) 70.6 kg (155 lb 10.3 oz)    Examination:  General exam: Appears calm and comfortable, NG tube in place  Respiratory system: Clear to auscultation. Respiratory effort normal. Cardiovascular system: S1 & S2 heard, RRR. No JVD, murmurs, rubs, gallops or clicks. No pedal edema. Gastrointestinal system: Abdomen is nondistended, soft and nontender. No organomegaly or masses felt. Normal bowel sounds heard. Central nervous system: Alert and oriented. No focal neurological deficits. Extremities: Symmetric 5 x 5 power. Skin: No rashes, lesions or ulcers Psychiatry: Judgement and insight appear normal. Mood & affect appropriate.     Data Reviewed: I have personally reviewed following labs and imaging studies  CBC:  Recent Labs Lab 06/11/16 1221  06/13/16 1942 06/14/16 0518 06/15/16 0636  WBC 7.7 7.8 7.5 7.1  NEUTROABS 4,543  --   --   --   HGB 14.1 13.3 12.0* 12.4*  HCT 43.1 39.7 36.5* 37.7*  MCV 84.7 84.1 84.9 85.3  PLT 190 158 146* 675*   Basic Metabolic Panel:  Recent Labs Lab 06/11/16 1221 06/13/16 1942 06/14/16 0518 06/15/16 0636  NA 139 139 139 138  K 4.7 4.5 4.4 4.4  CL 108 111 113* 109  CO2 27 21* 22 22  GLUCOSE 91 89 82 77  BUN 6* 5* 6 8  CREATININE 1.60* 1.36* 1.41* 1.23  CALCIUM 8.7 8.1* 7.2* 7.6*   GFR: Estimated Creatinine Clearance: 59.8 mL/min (by C-G formula based on SCr of 1.23 mg/dL). Liver Function Tests:  Recent Labs Lab 06/11/16 1221 06/13/16 1942  AST 16 24    ALT 13 23  ALKPHOS 88 81  BILITOT 0.4 0.4  PROT 6.4 6.2*  ALBUMIN 3.9 3.6    Recent Labs Lab 06/11/16 1221 06/13/16 1942  LIPASE 21 23   No results for input(s): AMMONIA in the last 168 hours. Coagulation Profile: No results for input(s): INR, PROTIME in the last 168 hours. Cardiac Enzymes: No results for input(s): CKTOTAL, CKMB, CKMBINDEX, TROPONINI in the last 168 hours. BNP (last 3 results) No results for input(s): PROBNP in the last 8760 hours. HbA1C: No results for input(s): HGBA1C in the last 72 hours. CBG: No results for input(s): GLUCAP in the last 168 hours. Lipid Profile: No results for input(s): CHOL, HDL, LDLCALC, TRIG, CHOLHDL, LDLDIRECT in the last 72 hours. Thyroid Function Tests: No results for input(s): TSH, T4TOTAL, FREET4, T3FREE, THYROIDAB in the last 72 hours. Anemia Panel: No results for input(s): VITAMINB12, FOLATE, FERRITIN, TIBC, IRON, RETICCTPCT in the last 72 hours. Sepsis Labs: No results for input(s): PROCALCITON, LATICACIDVEN in the last 168 hours.  No results found for this or any previous visit (from the past 240 hour(s)).       Radiology Studies: No results found.      Scheduled Meds: . bisacodyl  10 mg Rectal BID  . carvedilol  6.25 mg Oral BID WC  . enoxaparin (LOVENOX) injection  60 mg Subcutaneous Q24H   Continuous Infusions: . sodium chloride 75 mL/hr at 06/16/16 1050     LOS: 3 days    Time spent: 28mns    Averie Meiner, MD [facility] Pager [phone] If 7PM-7AM, please contact night-coverage [email or web address] Password [email or web address]3/28/2018, 1:37 PM

## 2016-06-17 DIAGNOSIS — R109 Unspecified abdominal pain: Secondary | ICD-10-CM

## 2016-06-17 DIAGNOSIS — G8929 Other chronic pain: Secondary | ICD-10-CM

## 2016-06-17 LAB — CBC
HEMATOCRIT: 37 % — AB (ref 39.0–52.0)
Hemoglobin: 12.5 g/dL — ABNORMAL LOW (ref 13.0–17.0)
MCH: 28.3 pg (ref 26.0–34.0)
MCHC: 33.8 g/dL (ref 30.0–36.0)
MCV: 83.9 fL (ref 78.0–100.0)
PLATELETS: 147 10*3/uL — AB (ref 150–400)
RBC: 4.41 MIL/uL (ref 4.22–5.81)
RDW: 18.7 % — AB (ref 11.5–15.5)
WBC: 7 10*3/uL (ref 4.0–10.5)

## 2016-06-17 NOTE — Progress Notes (Signed)
PROGRESS NOTE    [name]  VQM:[id number] DOB: May 07, 1950 DOA: 06/13/2016 PCP: [name], MD    Brief Narrative:  66 y/o male with history of abdominal surgeries, presents with abdominal pain and vomiting and found to have ileus and partial sbo. Admitted for further management.   Assessment & Plan:   Principal Problem:   Ileus ([place]) Active Problems:   S/P partial gastrectomy   Personal history of PE (pulmonary embolism)   Chronic abdominal pain   Essential hypertension   History of colon cancer, stage III   Iron deficiency anemia   Chronic diastolic heart failure (HCC)   CKD (chronic kidney disease) stage 3, GFR 30-59 ml/min   1. Ileus/partial small bowel obstruction. Patient was admitted with vomiting, abdominal pain and no BM . Abdominal xray shows mildly dilated small bowel loops which may represent ileus or distal sbo. NG tube  Placed and on intermittent suction, he was able to place flatus and had a BM today. NG tube taken out and he was started on full liquid diet and able to tolerate without any symptoms. Recommend continue with conservative  management. General surgery consulted, no surgical repair at this time.   2. Previous history of PE. Patient is chronically on lovenox as an outpatient. Will continue home dose.  3. Chronic diastolic chf. Appears compensated at this time. No sob.   4.  Acute on chronic Stage 3 CKD: . Creatinine appears to be near baseline. Continue to monitor.  5 mild normocytic anemia; stable.   6. Mild thrombocytopenia: if platelets less than 100,000, will need to stop the lovenox. Repeat CBC  Today.      DVT prophylaxis: lovenox Code Status: full code Family Communication: no family present Disposition Plan: discharge home when able to take po without symptoms.    Consultants:   surgery  Procedures:   none  Antimicrobials:    none.    Subjective: No nausea or vomiting, bm this am. NG tube out.    Objective: Vitals:   06/15/16 2112 06/16/16 0642 06/16/16 1424 06/17/16 0623  BP: (!) 141/77 139/79 134/82 138/72  Pulse: 79 73 74 69  Resp: '18 18 20 18  '$ Temp: 98.2 F (36.8 C) 98.5 F (36.9 C) 98.6 F (37 C) 98.5 F (36.9 C)  TempSrc: Oral Oral Oral Oral  SpO2: 96% 96% 98% 96%  Weight:    69.6 kg (153 lb 6.4 oz)  Height:        Intake/Output Summary (Last 24 hours) at 06/17/16 1057 Last data filed at 06/17/16 0600  Gross per 24 hour  Intake           1437.5 ml  Output              350 ml  Net           1087.5 ml   Filed Weights   06/13/16 2220 06/14/16 0658 06/17/16 0623  Weight: 70.8 kg (156 lb) 70.6 kg (155 lb 10.3 oz) 69.6 kg (153 lb 6.4 oz)    Examination:  General exam: Appears calm and comfortable,  Respiratory system: Clear to auscultation. Respiratory effort normal. Cardiovascular system: S1 & S2 heard, RRR. No JVD, murmurs, rubs, gallops or clicks. No pedal edema. Gastrointestinal system: Abdomen is nondistended, soft and nontender. No organomegaly or masses felt. Normal bowel sounds heard. Central nervous system: Alert and oriented. No focal neurological deficits. Extremities: Symmetric 5 x 5 power. Skin: No rashes, lesions or ulcers Psychiatry: Judgement and insight appear normal.  Mood & affect appropriate.     Data Reviewed: I have personally reviewed following labs and imaging studies  CBC:  Recent Labs Lab 06/11/16 1221 06/13/16 1942 06/14/16 0518 06/15/16 0636  WBC 7.7 7.8 7.5 7.1  NEUTROABS 4,543  --   --   --   HGB 14.1 13.3 12.0* 12.4*  HCT 43.1 39.7 36.5* 37.7*  MCV 84.7 84.1 84.9 85.3  PLT 190 158 146* 462*   Basic Metabolic Panel:  Recent Labs Lab 06/11/16 1221 06/13/16 1942 06/14/16 0518 06/15/16 0636  NA 139 139 139 138  K 4.7 4.5 4.4 4.4  CL 108 111 113* 109  CO2 27 21* 22 22  GLUCOSE 91 89 82 77  BUN 6* 5* 6 8  CREATININE 1.60* 1.36* 1.41* 1.23  CALCIUM 8.7 8.1* 7.2* 7.6*   GFR: Estimated Creatinine Clearance:  58.9 mL/min (by C-G formula based on SCr of 1.23 mg/dL). Liver Function Tests:  Recent Labs Lab 06/11/16 1221 06/13/16 1942  AST 16 24  ALT 13 23  ALKPHOS 88 81  BILITOT 0.4 0.4  PROT 6.4 6.2*  ALBUMIN 3.9 3.6    Recent Labs Lab 06/11/16 1221 06/13/16 1942  LIPASE 21 23   No results for input(s): AMMONIA in the last 168 hours. Coagulation Profile: No results for input(s): INR, PROTIME in the last 168 hours. Cardiac Enzymes: No results for input(s): CKTOTAL, CKMB, CKMBINDEX, TROPONINI in the last 168 hours. BNP (last 3 results) No results for input(s): PROBNP in the last 8760 hours. HbA1C: No results for input(s): HGBA1C in the last 72 hours. CBG: No results for input(s): GLUCAP in the last 168 hours. Lipid Profile: No results for input(s): CHOL, HDL, LDLCALC, TRIG, CHOLHDL, LDLDIRECT in the last 72 hours. Thyroid Function Tests: No results for input(s): TSH, T4TOTAL, FREET4, T3FREE, THYROIDAB in the last 72 hours. Anemia Panel: No results for input(s): VITAMINB12, FOLATE, FERRITIN, TIBC, IRON, RETICCTPCT in the last 72 hours. Sepsis Labs: No results for input(s): PROCALCITON, LATICACIDVEN in the last 168 hours.  No results found for this or any previous visit (from the past 240 hour(s)).       Radiology Studies: No results found.      Scheduled Meds: . bisacodyl  10 mg Rectal BID  . carvedilol  6.25 mg Oral BID WC  . enoxaparin (LOVENOX) injection  60 mg Subcutaneous Q24H   Continuous Infusions: . sodium chloride 75 mL/hr at 06/16/16 2319     LOS: 4 days    Time spent: 55mns    Briena Swingler, MD [facility] Pager [phone] If 7PM-7AM, please contact night-coverage [email or web address] Password [email or web address]3/29/2018, 10:57 AM

## 2016-06-17 NOTE — Progress Notes (Signed)
  Subjective: Denies any abdominal pain. It is passing flatus and having a bowel movement.  Objective: Vital signs in last 24 hours: Temp:  [98.5 F (36.9 C)-98.6 F (37 C)] 98.5 F (36.9 C) (03/29 0623) Pulse Rate:  [69-74] 69 (03/29 0623) Resp:  [18-20] 18 (03/29 0623) BP: (134-138)/(72-82) 138/72 (03/29 0623) SpO2:  [96 %-98 %] 96 % (03/29 0623) Weight:  [153 lb 6.4 oz (69.6 kg)] 153 lb 6.4 oz (69.6 kg) (03/29 0623) Last BM Date: 06/15/16  Intake/Output from previous day: 03/28 0701 - 03/29 0700 In: 1437.5 [I.V.:1437.5] Out: 550 [Urine:550] Intake/Output this shift: No intake/output data recorded.  General appearance: alert, cooperative and no distress GI: soft, non-tender; bowel sounds normal; no masses,  no organomegaly  Lab Results:   Recent Labs  06/15/16 0636  WBC 7.1  HGB 12.4*  HCT 37.7*  PLT 134*   BMET  Recent Labs  06/15/16 0636  NA 138  K 4.4  CL 109  CO2 22  GLUCOSE 77  BUN 8  CREATININE 1.23  CALCIUM 7.6*   PT/INR No results for input(s): LABPROT, INR in the last 72 hours.  Studies/Results: No results found.  Anti-infectives: Anti-infectives    None      Assessment/Plan: Impression: Bowel dysfunction, resolving Plan: Will remove NG tube and advance to full liquid diet. Patient instructed to ambulate. No need for surgical intervention.  LOS: 4 days    Aviva Signs 06/17/2016

## 2016-06-18 MED ORDER — HEPARIN SOD (PORK) LOCK FLUSH 100 UNIT/ML IV SOLN
500.0000 [IU] | INTRAVENOUS | Status: DC | PRN
  Filled 2016-06-18: qty 5

## 2016-06-18 NOTE — Progress Notes (Signed)
Pt discharged in stable condition via wheelchair into the care of his family via private vehicle. Port-a-Cath flushed with heparin.  Discharge instructions reviewed with pt/  Pt verbalized understanding.

## 2016-06-18 NOTE — Care Management Important Message (Signed)
Important Message  Patient Details  Name: [name] MRN: [id number] Date of Birth: 03-Apr-1950   Medicare Important Message Given:  Yes    Edman Lipsey, Chauncey Reading, RN 06/18/2016, 8:22 AM

## 2016-06-18 NOTE — Progress Notes (Signed)
  Subjective: No abdominal pain, nausea, or vomiting. His bowel function has returned to his baseline.  Objective: Vital signs in last 24 hours: Temp:  [98 F (36.7 C)-98.2 F (36.8 C)] 98.2 F (36.8 C) (03/30 0515) Pulse Rate:  [56-74] 56 (03/30 0515) Resp:  [18] 18 (03/30 0515) BP: (122-140)/(74-82) 140/82 (03/30 0515) SpO2:  [97 %-98 %] 97 % (03/30 0515) Weight:  [154 lb 14.4 oz (70.3 kg)] 154 lb 14.4 oz (70.3 kg) (03/30 0515) Last BM Date: 06/17/16  Intake/Output from previous day: 03/29 0701 - 03/30 0700 In: 2300 [P.O.:720; I.V.:1580] Out: 1050 [Urine:1050] Intake/Output this shift: Total I/O In: 360 [P.O.:360] Out: -   General appearance: alert, cooperative and no distress GI: soft, non-tender; bowel sounds normal; no masses,  no organomegaly  Lab Results:   Recent Labs  06/17/16 1233  WBC 7.0  HGB 12.5*  HCT 37.0*  PLT 147*   BMET No results for input(s): NA, K, CL, CO2, GLUCOSE, BUN, CREATININE, CALCIUM in the last 72 hours. PT/INR No results for input(s): LABPROT, INR in the last 72 hours.  Studies/Results: No results found.  Anti-infectives: Anti-infectives    None      Assessment/Plan: Impression: Intestinal atony, resolved. Patient back to baseline. Plan: Okay for discharge from surgery standpoint.  LOS: 5 days    Aviva Signs 06/18/2016

## 2016-06-19 NOTE — Discharge Summary (Signed)
Physician Discharge Summary  [name]:[id number] DOB: 02/23/1951 DOA: 06/13/2016  PCP: [name], MD  Admit date: 06/13/2016 Discharge date: 06/18/2016  Admitted From: Home Disposition: Home   Recommendations for Outpatient Follow-up:  1. Follow up with PCP in 1-2 weeks 2. Please obtain BMP/CBC in one week  Discharge Condition:stable.  CODE STATUS: full code.  Diet recommendation: Heart Healthy   Brief/Interim Summary: 66 y/o male with history of abdominal surgeries, presents with abdominal pain and vomiting and found to have ileus and partial sbo. Admitted for further management.  Discharge Diagnoses:  Principal Problem:   Ileus ([place]) Active Problems:   S/P partial gastrectomy   Personal history of PE (pulmonary embolism)   Chronic abdominal pain   Essential hypertension   History of colon cancer, stage III   Iron deficiency anemia   Chronic diastolic heart failure (HCC)   CKD (chronic kidney disease) stage 3, GFR 30-59 ml/min  Ileus/partial small bowel obstruction. Patient was admitted with vomiting, abdominal pain and no BM . Abdominal xray shows mildly dilated small bowel loops which may represent ileus or distal sbo. NG tube  Placed and on intermittent suction, he was able to place flatus and had a BM today. NG tube taken out and he was started on full liquid diet and able to tolerate without any symptoms.advanced to soft diet and discharged home.. General surgery consulted, no surgical repair at this time.   2. Previous history of PE. Patient is chronically on lovenox as an outpatient. Will continue home dose.  3. Chronic diastolic chf. Appears compensated at this time. No sob.   4.  Acute on chronic Stage 3 CKD: . Creatinine appears to be near baseline. Continue to monitor.  5 mild normocytic anemia; stable.   6. Mild thrombocytopenia: stable.  Discharge Instructions  Discharge Instructions    Diet - low sodium heart healthy    Complete by:   As directed    Discharge instructions    Complete by:  As directed    Follow up with PCP in one week.     Allergies as of 06/18/2016   No Known Allergies     Medication List    TAKE these medications   acetaminophen 500 MG tablet Commonly known as:  TYLENOL Take 500 mg by mouth every 6 (six) hours as needed.   acidophilus Caps capsule Take 1 capsule by mouth daily.   atorvastatin 20 MG tablet Commonly known as:  LIPITOR Take 20 mg by mouth at bedtime.   bisacodyl 10 MG suppository Commonly known as:  DULCOLAX Place 1 suppository (10 mg total) rectally daily. What changed:  when to take this  reasons to take this   carvedilol 6.25 MG tablet Commonly known as:  COREG Take 6.25 mg by mouth 2 (two) times daily with a meal.   cholecalciferol 1000 units tablet Commonly known as:  VITAMIN D Take 1,000 Units by mouth daily.   Copper Gluconate 2 MG Caps Take 1 capsule by mouth daily.   cyanocobalamin 1000 MCG/ML injection Commonly known as:  (VITAMIN B-12) Inject 1,000 mcg into the muscle every 30 (thirty) days.   DEXILANT 60 MG capsule Generic drug:  dexlansoprazole Take 60 mg by mouth daily.   docusate sodium 100 MG capsule Commonly known as:  COLACE Take 100 mg by mouth 2 (two) times daily.   enoxaparin 60 MG/0.6ML injection Commonly known as:  LOVENOX Inject 60 mg into the skin at bedtime.   ENSURE PLUS HN Liqd Take 1  Bottle by mouth every other day.   ferrous sulfate 325 (65 FE) MG tablet Take 1 tablet (325 mg total) by mouth 2 (two) times daily with a meal.   levETIRAcetam 750 MG tablet Commonly known as:  KEPPRA Take 750 mg by mouth 2 (two) times daily.   lidocaine-prilocaine cream Commonly known as:  EMLA Apply 1 application topically as needed (prior to accessing port).   LORazepam 1 MG tablet Commonly known as:  ATIVAN Take 1 tablet (1 mg total) by mouth every 8 (eight) hours as needed for anxiety.   MAGNESIUM-OXIDE 400 (241.3 Mg) MG  tablet Generic drug:  magnesium oxide Take 400 mg by mouth 2 (two) times daily.   multivitamin with minerals Tabs tablet Take 1 tablet by mouth daily.   ondansetron 4 MG tablet Commonly known as:  ZOFRAN Take 1 tablet (4 mg total) by mouth every 8 (eight) hours as needed for nausea.   polyethylene glycol packet Commonly known as:  MIRALAX / GLYCOLAX Take 17 g by mouth daily as needed for mild constipation or moderate constipation.   PROAIR HFA 108 (90 Base) MCG/ACT inhaler Generic drug:  albuterol Inhale 1-2 puffs into the lungs every 6 (six) hours as needed for wheezing or shortness of breath.   prochlorperazine 10 MG tablet Commonly known as:  COMPAZINE Take 10 mg by mouth every 6 (six) hours as needed for nausea or vomiting.   promethazine 25 MG tablet Commonly known as:  PHENERGAN Take 1 tablet (25 mg total) by mouth every 6 (six) hours as needed for nausea.   RECLAST 5 MG/100ML Soln injection Generic drug:  zoledronic acid Inject 5 mg into the vein See admin instructions. Pt gets a dose yearly.   tamsulosin 0.4 MG Caps capsule Commonly known as:  FLOMAX Take 0.4 mg by mouth at bedtime.   Vitamin D (Ergocalciferol) 50000 units Caps capsule Commonly known as:  DRISDOL Take 50,000 Units by mouth every Sunday.      Follow-up Information    CAPLAN,Jospeh, MD. Schedule an appointment as soon as possible for a visit in 1 week(s).   Specialty:  Internal Medicine Why:  post hospitalization visit.  Contact information: [place] [address] [id number] [phone]          No Known Allergies  Consultations:  Surgery.   Procedures/Studies: Dg Abdomen 1 View  Result Date: 06/13/2016 CLINICAL DATA:  66 year old male with left abdominal pain for 3 days. Previous partial gastrectomy and gastrojejunostomy. Enteric tube placement. EXAM: ABDOMEN - 1 VIEW COMPARISON:  1923 hours today. FINDINGS: Portable AP supine view at 2111 hours. Enteric tube has been  placed, and courses to the gastric remnant. The side hole is probably at the gastroesophageal junction or just inside the stomach, but the tube likely will not advance farther given the previous postoperative changes. Multiple surgical clips in the bilateral upper quadrants. Superimposed left upper abdomen bowel staple line. Bowel gas pattern is stable from the recent CT Abdomen and Pelvis. No acute osseous abnormality identified. IMPRESSION: 1. Enteric tube tip at the level of the gastric remnant. 2. Stable bowel gas pattern since the CT Abdomen and Pelvis on 06/11/2016. Electronically Signed   By: Genevie Ann M.D.   On: 06/13/2016 21:39   Dg Abdomen 1 View  Result Date: 06/13/2016 CLINICAL DATA:  Acute left upper quadrant abdominal pain. EXAM: ABDOMEN - 1 VIEW COMPARISON:  Radiographs of May 20, 2016. FINDINGS: Mildly dilated small bowel loops are noted which may represent  ileus or distal small bowel obstruction. Surgical sutures and clips are noted in the left side of the abdomen. Air-filled colon is noted without significant dilatation. IMPRESSION: Mildly dilated small bowel loops are noted which may represent ileus or distal small bowel obstruction. Continued radiographic follow-up is recommended. Electronically Signed   By: Marijo Conception, M.D.   On: 06/13/2016 20:08   Ct Abdomen Pelvis W Contrast  Result Date: 06/11/2016 CLINICAL DATA:  Anorexia and abdominal pain EXAM: CT ABDOMEN AND PELVIS WITH CONTRAST TECHNIQUE: Multidetector CT imaging of the abdomen and pelvis was performed using the standard protocol following bolus administration of intravenous contrast. CONTRAST:  25m ISOVUE-300 IOPAMIDOL (ISOVUE-300) INJECTION 61% COMPARISON:  03/19/2016 FINDINGS: Lower chest: No acute abnormality. Hepatobiliary: 6 mm low density structure within the anterior right lobe of liver is unchanged from previous exam. Previous cholecystectomy. No biliary dilatation. Pancreas: Unremarkable. No pancreatic ductal  dilatation or surrounding inflammatory changes. Spleen: Normal in size without focal abnormality. Adrenals/Urinary Tract: Similar appearance of right kidney cysts. Nonobstructing left renal calculi is I had. The largest measures 3 mm. No hydronephrosis or mass identified. The urinary bladder is normal. Stomach/Bowel: Postsurgical changes from partial gastrectomy and gastrojejunostomy is again identified there is enteric contrast material within the small and large bowel loops up to the level of the rectum. Vascular/Lymphatic: Aortic atherosclerosis. No upper abdominal adenopathy. No pelvic or inguinal adenopathy. Reproductive: Prostate gland is unremarkable. Other: No free fluid or fluid collections identified. Musculoskeletal: No acute or significant osseous findings. IMPRESSION: 1. No acute findings within the abdomen or pelvis. 2. Nonobstructing left renal calculi. 3. Postsurgical changes from partial gastrectomy and gastrojejunostomy. Enteric contrast material can be seen within the small and large bowel loops up to the level the rectum without evidence for obstruction. 4. Aortic atherosclerosis. Electronically Signed   By: TKerby MoorsM.D.   On: 06/11/2016 16:06       Subjective: No new complaints.   Discharge Exam: Vitals:   06/17/16 2230 06/18/16 0515  BP: 122/74 140/82  Pulse: 72 (!) 56  Resp: 18 18  Temp: 98 F (36.7 C) 98.2 F (36.8 C)   Vitals:   06/17/16 0623 06/17/16 1400 06/17/16 2230 06/18/16 0515  BP: 138/72 130/79 122/74 140/82  Pulse: 69 74 72 (!) 56  Resp: '18 18 18 18  '$ Temp: 98.5 F (36.9 C) 98.2 F (36.8 C) 98 F (36.7 C) 98.2 F (36.8 C)  TempSrc: Oral  Oral Oral  SpO2: 96% 98% 97% 97%  Weight: 69.6 kg (153 lb 6.4 oz)   70.3 kg (154 lb 14.4 oz)  Height:        General: Pt is alert, awake, not in acute distress Cardiovascular: RRR, S1/S2 +, no rubs, no gallops Respiratory: CTA bilaterally, no wheezing, no rhonchi Abdominal: Soft, NT, ND, bowel sounds  + Extremities: no edema, no cyanosis    The results of significant diagnostics from this hospitalization (including imaging, microbiology, ancillary and laboratory) are listed below for reference.     Microbiology: No results found for this or any previous visit (from the past 240 hour(s)).   Labs: BNP (last 3 results)  Recent Labs  05/15/16 0208  BNP 285.2  Basic Metabolic Panel:  Recent Labs Lab 06/13/16 1942 06/14/16 0518 06/15/16 0636  NA 139 139 138  K 4.5 4.4 4.4  CL 111 113* 109  CO2 21* 22 22  GLUCOSE 89 82 77  BUN 5* 6 8  CREATININE 1.36* 1.41* 1.23  CALCIUM 8.1*  7.2* 7.6*   Liver Function Tests:  Recent Labs Lab 06/13/16 1942  AST 24  ALT 23  ALKPHOS 81  BILITOT 0.4  PROT 6.2*  ALBUMIN 3.6    Recent Labs Lab 06/13/16 1942  LIPASE 23   No results for input(s): AMMONIA in the last 168 hours. CBC:  Recent Labs Lab 06/13/16 1942 06/14/16 0518 06/15/16 0636 06/17/16 1233  WBC 7.8 7.5 7.1 7.0  HGB 13.3 12.0* 12.4* 12.5*  HCT 39.7 36.5* 37.7* 37.0*  MCV 84.1 84.9 85.3 83.9  PLT 158 146* 134* 147*   Cardiac Enzymes: No results for input(s): CKTOTAL, CKMB, CKMBINDEX, TROPONINI in the last 168 hours. BNP: Invalid input(s): POCBNP CBG: No results for input(s): GLUCAP in the last 168 hours. D-Dimer No results for input(s): DDIMER in the last 72 hours. Hgb A1c No results for input(s): HGBA1C in the last 72 hours. Lipid Profile No results for input(s): CHOL, HDL, LDLCALC, TRIG, CHOLHDL, LDLDIRECT in the last 72 hours. Thyroid function studies No results for input(s): TSH, T4TOTAL, T3FREE, THYROIDAB in the last 72 hours.  Invalid input(s): FREET3 Anemia work up No results for input(s): VITAMINB12, FOLATE, FERRITIN, TIBC, IRON, RETICCTPCT in the last 72 hours. Urinalysis    Component Value Date/Time   COLORURINE YELLOW 05/15/2016 0310   APPEARANCEUR CLEAR 05/15/2016 0310   LABSPEC 1.015 05/15/2016 0310   PHURINE 5.0 05/15/2016 0310    GLUCOSEU NEGATIVE 05/15/2016 0310   HGBUR NEGATIVE 05/15/2016 0310   BILIRUBINUR NEGATIVE 05/15/2016 0310   KETONESUR NEGATIVE 05/15/2016 0310   PROTEINUR NEGATIVE 05/15/2016 0310   UROBILINOGEN 0.2 11/30/2014 1115   NITRITE NEGATIVE 05/15/2016 0310   LEUKOCYTESUR NEGATIVE 05/15/2016 0310   Sepsis Labs Invalid input(s): PROCALCITONIN,  WBC,  LACTICIDVEN Microbiology No results found for this or any previous visit (from the past 240 hour(s)).   Time coordinating discharge: Over 30 minutes  SIGNED:   Hosie Poisson, MD  [facility] 06/19/2016, 1:26 PM Pager   If 7PM-7AM, please contact night-coverage [email or web address] Password [email or web address]

## 2016-07-05 ENCOUNTER — Ambulatory Visit: Payer: Federal, State, Local not specified - PPO | Admitting: Nurse Practitioner

## 2016-07-23 ENCOUNTER — Encounter (HOSPITAL_COMMUNITY): Payer: Self-pay

## 2016-07-23 ENCOUNTER — Ambulatory Visit (HOSPITAL_COMMUNITY): Payer: Medicare Other | Admitting: Oncology

## 2016-07-23 ENCOUNTER — Encounter (HOSPITAL_COMMUNITY): Payer: Medicare Other | Attending: Nurse Practitioner

## 2016-07-23 DIAGNOSIS — Z85038 Personal history of other malignant neoplasm of large intestine: Secondary | ICD-10-CM | POA: Insufficient documentation

## 2016-07-23 DIAGNOSIS — Z903 Acquired absence of stomach [part of]: Secondary | ICD-10-CM | POA: Insufficient documentation

## 2016-07-23 DIAGNOSIS — I700 Atherosclerosis of aorta: Secondary | ICD-10-CM | POA: Diagnosis not present

## 2016-07-23 DIAGNOSIS — N200 Calculus of kidney: Secondary | ICD-10-CM | POA: Insufficient documentation

## 2016-07-23 DIAGNOSIS — D509 Iron deficiency anemia, unspecified: Secondary | ICD-10-CM

## 2016-07-23 DIAGNOSIS — Z9889 Other specified postprocedural states: Secondary | ICD-10-CM | POA: Diagnosis not present

## 2016-07-23 DIAGNOSIS — K56609 Unspecified intestinal obstruction, unspecified as to partial versus complete obstruction: Secondary | ICD-10-CM | POA: Insufficient documentation

## 2016-07-23 DIAGNOSIS — R1084 Generalized abdominal pain: Secondary | ICD-10-CM | POA: Diagnosis not present

## 2016-07-23 LAB — BASIC METABOLIC PANEL
ANION GAP: 10 (ref 5–15)
BUN: 6 mg/dL (ref 6–20)
CALCIUM: 8.3 mg/dL — AB (ref 8.9–10.3)
CO2: 24 mmol/L (ref 22–32)
Chloride: 104 mmol/L (ref 101–111)
Creatinine, Ser: 1.55 mg/dL — ABNORMAL HIGH (ref 0.61–1.24)
GFR, EST AFRICAN AMERICAN: 53 mL/min — AB (ref >60)
GFR, EST NON AFRICAN AMERICAN: 45 mL/min — AB (ref >60)
Glucose, Bld: 71 mg/dL (ref 65–99)
Potassium: 3.9 mmol/L (ref 3.5–5.1)
SODIUM: 138 mmol/L (ref 135–145)

## 2016-07-23 LAB — IRON AND TIBC
IRON: 76 ug/dL (ref 45–182)
SATURATION RATIOS: 23 % (ref 17.9–39.5)
TIBC: 328 ug/dL (ref 250–450)
UIBC: 252 ug/dL

## 2016-07-23 LAB — CBC WITH DIFFERENTIAL/PLATELET
BASOS ABS: 0 10*3/uL (ref 0.0–0.1)
BASOS PCT: 0 %
Eosinophils Absolute: 0.2 10*3/uL (ref 0.0–0.7)
Eosinophils Relative: 3 %
HCT: 43.8 % (ref 39.0–52.0)
Hemoglobin: 14.4 g/dL (ref 13.0–17.0)
Lymphocytes Relative: 35 %
Lymphs Abs: 3 10*3/uL (ref 0.7–4.0)
MCH: 29 pg (ref 26.0–34.0)
MCHC: 32.9 g/dL (ref 30.0–36.0)
MCV: 88.1 fL (ref 78.0–100.0)
Monocytes Absolute: 1 10*3/uL (ref 0.1–1.0)
Monocytes Relative: 12 %
NEUTROS ABS: 4.3 10*3/uL (ref 1.7–7.7)
Neutrophils Relative %: 50 %
PLATELETS: 178 10*3/uL (ref 150–400)
RBC: 4.97 MIL/uL (ref 4.22–5.81)
RDW: 16.4 % — AB (ref 11.5–15.5)
WBC: 8.6 10*3/uL (ref 4.0–10.5)

## 2016-07-23 LAB — FERRITIN: Ferritin: 21 ng/mL — ABNORMAL LOW (ref 24–336)

## 2016-07-23 MED ORDER — SODIUM CHLORIDE 0.9% FLUSH
10.0000 mL | INTRAVENOUS | Status: DC | PRN
  Administered 2016-07-23: 10 mL via INTRAVENOUS
  Filled 2016-07-23: qty 10

## 2016-07-23 MED ORDER — HEPARIN SOD (PORK) LOCK FLUSH 100 UNIT/ML IV SOLN
500.0000 [IU] | Freq: Once | INTRAVENOUS | Status: AC
  Administered 2016-07-23: 500 [IU] via INTRAVENOUS
  Filled 2016-07-23: qty 5

## 2016-07-23 NOTE — Patient Instructions (Signed)
[facility] at [facility] Discharge Instructions  RECOMMENDATIONS MADE BY THE CONSULTANT AND ANY TEST RESULTS WILL BE SENT TO YOUR REFERRING PHYSICIAN.  Port flush done with labs Follow up as scheduled.  Thank you for choosing [facility] at [facility] to provide your oncology and hematology care.  To afford each patient quality time with our provider, please arrive at least 15 minutes before your scheduled appointment time.    If you have a lab appointment with the Cancer Center please come in thru the  Main Entrance and check in at the main information desk  You need to re-schedule your appointment should you arrive 10 or more minutes late.  We strive to give you quality time with our providers, and arriving late affects you and other patients whose appointments are after yours.  Also, if you no show three or more times for appointments you may be dismissed from the clinic at the providers discretion.     Again, thank you for choosing [facility].  Our hope is that these requests will decrease the amount of time that you wait before being seen by our physicians.       _____________________________________________________________  Should you have questions after your visit to [facility], please contact our office at [phone] between the hours of 8:30 a.m. and 4:30 p.m.  Voicemails left after 4:30 p.m. will not be returned until the following business day.  For prescription refill requests, have your pharmacy contact our office.       Resources For Cancer Patients and their Caregivers ? American Cancer Society: Can assist with transportation, wigs, general needs, runs Look Good Feel Better.        1-888-227-6333 ? Cancer Care: Provides financial assistance, online support groups, medication/co-pay assistance.  1-800-813-HOPE (4673) ? [facility] Assists [place] cancer patients  and their families through emotional , educational and financial support.  336-427-4357 ? [place] DSS Where to apply for food stamps, Medicaid and utility assistance. 336-342-1394 ? [facility]: Transportation to medical appointments. 336-347-2287 ? Social Security Administration: May apply for disability if have a Stage IV cancer. 336-342-7796 1-800-772-1213 ? [place] Aging, Disability and Transit Services: Assists with nutrition, care and transit needs. 336-349-2343  Cancer Center Support Programs: @10RELATIVEDAYS@ > Cancer Support Group  2nd Tuesday of the month 1pm-2pm, Journey Room  > Creative Journey  3rd Tuesday of the month 1130am-1pm, Journey Room  > Look Good Feel Better  1st Wednesday of the month 10am-12 noon, Journey Room (Call American Cancer Society to register 1-800-395-5775)   

## 2016-07-23 NOTE — Progress Notes (Signed)
[name] presented for Portacath access and flush. Portacath located left chest wall accessed with  H 20 needle. Good blood return present. Portacath flushed with 87m NS and 500U/567mHeparin and needle removed intact. Procedure without incident. Patient tolerated procedure well.  Vitals stable and discharged home from clinic ambulatory. Follow up as scheduled.  Patient 's BP has been staying elevated, encouraged him to go see his primary doctor for this. Patient also complained of left upper abdominal pain, he has contacted his GI doctor through My Chart to set up an appointment. Patient also has been retaining some fluid, patient states he has CHF but no fluid pill to take. Encouraged him to call his cardiologist.

## 2016-07-26 ENCOUNTER — Other Ambulatory Visit (HOSPITAL_COMMUNITY): Payer: Self-pay | Admitting: Oncology

## 2016-07-30 ENCOUNTER — Encounter (HOSPITAL_COMMUNITY): Payer: Self-pay

## 2016-07-30 ENCOUNTER — Encounter (HOSPITAL_BASED_OUTPATIENT_CLINIC_OR_DEPARTMENT_OTHER): Payer: Medicare Other

## 2016-07-30 VITALS — BP 137/88 | HR 71 | Temp 98.6°F | Resp 16

## 2016-07-30 DIAGNOSIS — D509 Iron deficiency anemia, unspecified: Secondary | ICD-10-CM | POA: Diagnosis present

## 2016-07-30 MED ORDER — SODIUM CHLORIDE 0.9 % IV SOLN
Freq: Once | INTRAVENOUS | Status: AC
  Administered 2016-07-30: 13:00:00 via INTRAVENOUS

## 2016-07-30 MED ORDER — SODIUM CHLORIDE 0.9 % IV SOLN
510.0000 mg | Freq: Once | INTRAVENOUS | Status: AC
  Administered 2016-07-30: 510 mg via INTRAVENOUS
  Filled 2016-07-30: qty 17

## 2016-07-30 MED ORDER — HEPARIN SOD (PORK) LOCK FLUSH 100 UNIT/ML IV SOLN
INTRAVENOUS | Status: AC
  Filled 2016-07-30: qty 5

## 2016-07-30 MED ORDER — HEPARIN SOD (PORK) LOCK FLUSH 100 UNIT/ML IV SOLN
500.0000 [IU] | Freq: Once | INTRAVENOUS | Status: AC
  Administered 2016-07-30: 500 [IU] via INTRAVENOUS

## 2016-07-30 NOTE — Patient Instructions (Signed)
[place] at [facility] Discharge Instructions  RECOMMENDATIONS MADE BY THE CONSULTANT AND ANY TEST RESULTS WILL BE SENT TO YOUR REFERRING PHYSICIAN.  One dose of feraheme given today. Follow up as scheduled.  Thank you for choosing [place] at [facility] to provide your oncology and hematology care.  To afford each patient quality time with our provider, please arrive at least 15 minutes before your scheduled appointment time.    If you have a lab appointment with the [facility] please come in thru the  Main Entrance and check in at the main information desk  You need to re-schedule your appointment should you arrive 10 or more minutes late.  We strive to give you quality time with our providers, and arriving late affects you and other patients whose appointments are after yours.  Also, if you no show three or more times for appointments you may be dismissed from the clinic at the providers discretion.     Again, thank you for choosing [facility].  Our hope is that these requests will decrease the amount of time that you wait before being seen by our physicians.       _____________________________________________________________  Should you have questions after your visit to [facility], please contact our office at [phone] between the hours of 8:30 a.m. and 4:30 p.m.  Voicemails left after 4:30 p.m. will not be returned until the following business day.  For prescription refill requests, have your pharmacy contact our office.       Resources For Cancer Patients and their Caregivers ? American Cancer Society: Can assist with transportation, wigs, general needs, runs Look Good Feel Better.        [phone] ? Cancer Care: Provides financial assistance, online support groups, medication/co-pay assistance.  1-800-813-HOPE 606 422 0100) ? [place] Assists [place] cancer  patients and their families through emotional , educational and financial support.  705-380-2835 ? [place] DSS Where to apply for food stamps, Medicaid and utility assistance. [phone] ? RCATS: Transportation to medical appointments. [phone] ? Social Security Administration: May apply for disability if have a Stage IV cancer. [phone] ? [employer], Disability and Transit Services: Assists with nutrition, care and transit needs. [place] Support Programs: '@10RELATIVEDAYS'$ @ > Cancer Support Group  2nd Tuesday of the month 1pm-2pm, Journey Room  > Creative Journey  3rd Tuesday of the month 1130am-1pm, Journey Room  > Look Good Feel Better  1st Wednesday of the month 10am-12 noon, Journey Room (Call [place] to register [phone])

## 2016-07-30 NOTE — Progress Notes (Signed)
feraheme given today per orders. Patient tolerated it well. Vitals stable and discharged home from clinic ambulatory. Follow up as scheduled.

## 2016-08-05 ENCOUNTER — Encounter (HOSPITAL_BASED_OUTPATIENT_CLINIC_OR_DEPARTMENT_OTHER): Payer: Medicare Other | Admitting: Oncology

## 2016-08-05 ENCOUNTER — Encounter (HOSPITAL_COMMUNITY): Payer: Self-pay | Admitting: Oncology

## 2016-08-05 VITALS — BP 122/83 | HR 89 | Temp 98.4°F | Resp 16 | Ht 69.0 in | Wt 155.0 lb

## 2016-08-05 DIAGNOSIS — D509 Iron deficiency anemia, unspecified: Secondary | ICD-10-CM

## 2016-08-05 DIAGNOSIS — Z85038 Personal history of other malignant neoplasm of large intestine: Secondary | ICD-10-CM | POA: Diagnosis not present

## 2016-08-05 DIAGNOSIS — C189 Malignant neoplasm of colon, unspecified: Secondary | ICD-10-CM

## 2016-08-05 NOTE — Progress Notes (Signed)
[name], MD [address] [place] [address]  Iron deficiency anemia, unspecified iron deficiency anemia type - Plan: CBC with Differential, Basic metabolic panel, Iron and TIBC, Ferritin, CBC with Differential, Basic metabolic panel, Iron and TIBC, Ferritin  Adenocarcinoma of colon with mucinous features - Plan: CEA  CURRENT THERAPY: Surveillance per NCCN guidelines.  IV iron when indicated  INTERVAL HISTORY: [name] 66 y.o. male returns for followup of Iron deficiency anemia, requiring IV iron replacement when indicated.  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 Geroge Baseman on 07/31/2010 followed by FOLFOX x 7/12 cycles ending on 01/04/2011 due to progressive peripheral neuropathy. At time of surgery tumor was 2.5 cm, moderately differentiated, with 1 out of 18 lymph nodes positive for metastatic disease.  Post-treatment colonoscopy/EGD by Dr. [name] on 05/15/2015 was normal and repeat is recommended in 2022.  He has completed 5 years worth of surveillance in accordance with the NCCN guidelines as of 2017. AND Recurrent SBOs, secondary to abdominal adhesions following multiple abdominal surgeries in the past AND H/O EtOHism   HPI Elements CRC  Location: Left colon  Quality: Adenocarcinoma  Severity: Stage III  Duration: Dx in May 2012  Context: S/P resection on 07/31/2010, followed by adjuvant FOLFOX x 7/12 cycles  Timing:   Modifying Factors: Adjuvant chemotherapy discontinued after 7 cycles due to progressive peripheral neuropathy  Associated Signs & Symptoms:     HPI Elements IDA  Location: Blood  Quality: Iron deficiency  Severity: Moderate  Duration: Years  Context: Requiring IV iron  Timing:   Modifying Factors:   Associated Signs & Symptoms: Anemia   He reports an appetite at 25%.  Weight is stable.  Energy level 25%.  He reports fatigue, abdominal pain,  diarrhea, worsening nausea, intermittent dizziness, and numbness of hands and fingers.  He notes that the peripheral neuropathy is stable.  He does report a pain in the abdomen which he rates as a 4/10.  He denies any significant improvement in his energy level with his most recent iron infusion.  I am not specifically surprised given that his hemoglobin was within normal limits.  Review of Systems  Constitutional: Positive for malaise/fatigue. Negative for chills, fever and weight loss.  HENT: Negative.   Eyes: Negative.   Respiratory: Negative.  Negative for cough.   Cardiovascular: Negative.  Negative for chest pain.  Gastrointestinal: Positive for abdominal pain, diarrhea and nausea. Negative for blood in stool, constipation, melena and vomiting.  Genitourinary: Negative.   Musculoskeletal: Negative.   Skin: Negative.   Neurological: Positive for dizziness and sensory change. Negative for weakness.  Endo/Heme/Allergies: Negative.   Psychiatric/Behavioral: Negative.     Past Medical History:  Diagnosis Date  . 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 Anemia   . Anxiety   . Arthritis   . Barrett's esophagus   . Blood transfusion   . Bowel obstruction ([place]) 05/13/2012   Recurrent  . Bronchitis   . Chest pain at rest   . Chronic abdominal pain   . Erosive esophagitis   . ETOH abuse    quit 03/2010  . GERD (gastroesophageal reflux disease)   . Hx of Clostridium difficile infection 01/2012  . Hypertension   . Ileus ([place])   . Iron deficiency anemia 03/23/2016  . Obstruction of bowel ([place]) 03/03/14  . Osteoporosis   .  Personal history of PE (pulmonary embolism) 10/01/2010  . Pneumonia   . Pulmonary embolism ([place]) 02/2010  . Recurrent upper respiratory infection (URI)   . S/P endoscopy September 28, 2010   erosive reflux esophagitis, Billroth I anatomy  . 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 Seizures ([place])   . Shortness of breath   . Sleep apnea   . TIA  (transient ischemic attack) 10/11  . Vitamin B12 deficiency     Past Surgical History:  Procedure Laterality Date  . ABDOMINAL ADHESION SURGERY  03/04/15   @ [facility]  . ABDOMINAL EXPLORATION SURGERY    . abdominal sugery     for bowel obstruction x 8, all in 1980s, except for one in 07/2010  . APPENDECTOMY  1980s  . Billroth 1 hemigastrectomy  1980s   per patient for benign duodenal tumor  . CARDIAC CATHETERIZATION  07/17/2012  . CHOLECYSTECTOMY  1980s  . COLON SURGERY  May 2012   left hemicolectomy, colon cancer found at time of surgery for bowel obstruction  . COLONOSCOPY  03/18/2011   anastomosis at 35cm. Several adenomatous polyps removed. Sigmoid diverticulosis. Next TCS 02/2013  . COLONOSCOPY N/A 07/24/2012   RFX:JOITGP post segmental resection with normal-appearing colonic anastomosis aside from an adjacent polyp-removed as described above. Rectal polyp-removed as described above. CT findings appear to have been artifactual. tubular adenomas/prolapsed type polyp.  . COLONOSCOPY N/A 05/15/2015   Procedure: COLONOSCOPY;  Surgeon: [name], MD;  [facility];  Service: Endoscopy;  Laterality: N/A;  . ESOPHAGOGASTRODUODENOSCOPY  09/28/2010  . ESOPHAGOGASTRODUODENOSCOPY  12/01/2010   Cervical web status post dilation, erosive esophagitis, B1 hemigastrectomy, inflamed anastomosis  . ESOPHAGOGASTRODUODENOSCOPY  04/16/2011   excoriation at GEJ c/w trauma/M-W tear, friable gastric anastomosis, dilation efferent limb  . ESOPHAGOGASTRODUODENOSCOPY N/A 06/03/2014   Dr.[name]- cervcal esopphageal web s/p dilation. abnormal distal esophagus bx= barretts esophagus  . ESOPHAGOGASTRODUODENOSCOPY N/A 05/15/2015   Procedure: ESOPHAGOGASTRODUODENOSCOPY (EGD);  Surgeon: [name], MD;  [facility];  Service: Endoscopy;  Laterality: N/A;  230  . ESOPHAGOGASTRODUODENOSCOPY (EGD) WITH ESOPHAGEAL DILATION  02/25/2012   QDI:YMEBRAXE esophageal web-s/p dilation anddisruption as  described above. Status post prior gastric with Billroth I configuration. Abnormal gastric mucosa at the anastomosis. Gastric biopsy showed mild chronic inflammation but no H. pylori   . HERNIA REPAIR     right inguinal  . MALONEY DILATION N/A 06/03/2014   Procedure: [name] DILATION;  Surgeon: [name], MD;  [facility];  Service: Endoscopy;  Laterality: N/A;  [name] DILATION N/A 05/15/2015   Procedure: [name] DILATION;  Surgeon: [name], MD;  [facility];  Service: Endoscopy;  Laterality: N/A;  . PORTACATH PLACEMENT    . SAVORY DILATION N/A 06/03/2014   Procedure: SAVORY DILATION;  Surgeon: [name], MD;  [facility];  Service: Endoscopy;  Laterality: N/A;    Family History  Problem Relation Age of Onset  . Hypertension Mother   . Arthritis Mother   . Pneumonia Mother   . Hypertension Father   . Heart attack Father   . Colon cancer Neg Hx     Social History   Social History  . Marital status: Married    Spouse name: N/A  . Number of children: 3  . Years of education: N/A   Occupational History  .  [employer]   Social History Main Topics  . Smoking status: Former Smoker    Packs/day: 0.50  Years: 40.00    Types: Cigarettes    Quit date: 12/20/2012  . Smokeless tobacco: Never Used  . Alcohol use No  . Drug use: No  . Sexual activity: No   Other Topics Concern  . None   Social History Narrative  . None     PHYSICAL EXAMINATION  ECOG PERFORMANCE STATUS: 1 - Symptomatic but completely ambulatory  Vitals:   08/05/16 1121  BP: 122/83  Pulse: 89  Resp: 16  Temp: 98.4 F (36.9 C)    GENERAL:alert, no distress, well nourished, well developed, comfortable, cooperative, smiling and unaccompanied SKIN: skin color, texture, turgor are normal, no rashes or significant lesions HEAD: Normocephalic, No masses, lesions, tenderness or abnormalities EYES: normal, EOMI, Conjunctiva are pink and  non-injected EARS: External ears normal OROPHARYNX:lips, buccal mucosa, and tongue normal and mucous membranes are moist  NECK: supple, trachea midline LYMPH:  no palpable lymphadenopathy BREAST:not examined LUNGS: clear to auscultation and percussion HEART: regular rate & rhythm, no murmurs and no gallops ABDOMEN:abdomen soft, non-tender, normal bowel sounds and nondistended BACK: Back symmetric, no curvature. EXTREMITIES:less then 2 second capillary refill, no joint deformities, effusion, or inflammation, no edema, no skin discoloration, no clubbing, no cyanosis  NEURO: alert & oriented x 3 with fluent speech, no focal motor/sensory deficits, gait normal   LABORATORY DATA: CBC    Component Value Date/Time   WBC 8.6 07/23/2016 1449   RBC 4.97 07/23/2016 1449   HGB 14.4 07/23/2016 1449   HCT 43.8 07/23/2016 1449   PLT 178 07/23/2016 1449   MCV 88.1 07/23/2016 1449   MCH 29.0 07/23/2016 1449   MCHC 32.9 07/23/2016 1449   RDW 16.4 (H) 07/23/2016 1449   LYMPHSABS 3.0 07/23/2016 1449   MONOABS 1.0 07/23/2016 1449   EOSABS 0.2 07/23/2016 1449   BASOSABS 0.0 07/23/2016 1449      Chemistry      Component Value Date/Time   NA 138 07/23/2016 1449   K 3.9 07/23/2016 1449   CL 104 07/23/2016 1449   CO2 24 07/23/2016 1449   BUN 6 07/23/2016 1449   CREATININE 1.55 (H) 07/23/2016 1449   CREATININE 1.60 (H) 06/11/2016 1221      Component Value Date/Time   CALCIUM 8.3 (L) 07/23/2016 1449   ALKPHOS 81 06/13/2016 1942   AST 24 06/13/2016 1942   ALT 23 06/13/2016 1942   BILITOT 0.4 06/13/2016 1942     Lab Results  Component Value Date   IRON 76 07/23/2016   TIBC 328 07/23/2016   FERRITIN 21 (L) 07/23/2016    PENDING LABS:   RADIOGRAPHIC STUDIES:  No results found.   PATHOLOGY:    ASSESSMENT AND PLAN:  Iron deficiency anemia Iron deficiency anemia, requiring IV iron replacement therapy.  Labs on 07/23/2016: CBC diff, BMET, iron/TIBC, ferritin.  I personally  reviewed and went over laboratory results with the patient.  The results are noted within this dictation.   HGB was WNL, but ferritin was low.  Therefore, he received 510 mg of feraheme on 07/30/2016.  Labs in 3 and 6 months: CBC diff, BMET, iron/TIBC, ferritin.  He complains of early satiety, post-prandial bloating and discomfort.  He has GI follow-up on 08/11/2016.  I wonder if he is experiencing gastric motility issue(s).  I thought about Reglan, but the patient reports trying this in the past with little to no benefit.  Will defer to GI.  Return in 6 months for follow-up.  Adenocarcinoma of colon with mucinous features Stage III (T3N1M0)  adenocarcinoma of left colon, Dx in 07/2010 and S/P segmental resection of left colon by Dr. Geroge Baseman on 07/31/2010 followed by FOLFOX x 7/12 cycles ending on 01/04/2011 due to progressive peripheral neuropathy. At time of surgery tumor was 2.5 cm, moderately differentiated, with 1 out of 18 lymph nodes positive for metastatic disease.  Post-treatment colonoscopy/EGD by Dr. [name] on 05/15/2015 was normal and repeat is recommended in 2022.  He has completed 5 years worth of surveillance in accordance with the NCCN guidelines as of 2017.   ORDERS PLACED FOR THIS ENCOUNTER: Orders Placed This Encounter  Procedures  . CBC with Differential  . Basic metabolic panel  . Iron and TIBC  . Ferritin  . CBC with Differential  . Basic metabolic panel  . Iron and TIBC  . Ferritin  . CEA    MEDICATIONS PRESCRIBED THIS ENCOUNTER: No orders of the defined types were placed in this encounter.   THERAPY PLAN:  We will continue to monitor his iron and provide IV iron when indicated.  He has completed 5 years worth of surveillance for his colorectal cancer diagnosed in 2012.  All questions were answered. The patient knows to call the clinic with any problems, questions or concerns. We can certainly see the patient much sooner if necessary.  Patient and plan discussed  with Dr. Twana First and she is in agreement with the aforementioned.   This note is electronically signed by: Doy Mince 08/05/2016 11:58 AM

## 2016-08-05 NOTE — Patient Instructions (Signed)
[place] at [facility] Discharge Instructions  RECOMMENDATIONS MADE BY THE CONSULTANT AND ANY TEST RESULTS WILL BE SENT TO YOUR REFERRING PHYSICIAN.  You were seen today by Kirby Crigler PA-C. Follow up with Primary care and your GI doctor. Return in 3 months for labs. Return in 6 months for labs and follow up.   Thank you for choosing [place] at [facility] to provide your oncology and hematology care.  To afford each patient quality time with our provider, please arrive at least 15 minutes before your scheduled appointment time.    If you have a lab appointment with the [place] please come in thru the  Main Entrance and check in at the main information desk  You need to re-schedule your appointment should you arrive 10 or more minutes late.  We strive to give you quality time with our providers, and arriving late affects you and other patients whose appointments are after yours.  Also, if you no show three or more times for appointments you may be dismissed from the clinic at the providers discretion.     Again, thank you for choosing [facility].  Our hope is that these requests will decrease the amount of time that you wait before being seen by our physicians.       _____________________________________________________________  Should you have questions after your visit to [facility], please contact our office at [phone] between the hours of 8:30 a.m. and 4:30 p.m.  Voicemails left after 4:30 p.m. will not be returned until the following business day.  For prescription refill requests, have your pharmacy contact our office.       Resources For Cancer Patients and their Caregivers ? American Cancer Society: Can assist with transportation, wigs, general needs, runs Look Good Feel Better.        6706695508 ? Cancer Care: Provides financial assistance, online support groups, medication/co-pay  assistance.  1-800-813-HOPE 941-308-2159) ? [place] Assists [place] cancer patients and their families through emotional , educational and financial support.  [phone] ? [place] DSS Where to apply for food stamps, Medicaid and utility assistance. [phone] ? [employer]: Transportation to medical appointments. 225-604-0141 ? Social Security Administration: May apply for disability if have a Stage IV cancer. [phone] [phone] ? [employer], Disability and Transit Services: Assists with nutrition, care and transit needs. [place] Support Programs: '@10RELATIVEDAYS'$ @ > Cancer Support Group  2nd Tuesday of the month 1pm-2pm, Journey Room  > Creative Journey  3rd Tuesday of the month 1130am-1pm, Journey Room  > Look Good Feel Better  1st Wednesday of the month 10am-12 noon, Journey Room (Call [place] to register [phone])

## 2016-08-05 NOTE — Assessment & Plan Note (Signed)
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 Geroge Baseman on 07/31/2010 followed by FOLFOX x 7/12 cycles ending on 01/04/2011 due to progressive peripheral neuropathy. At time of surgery tumor was 2.5 cm, moderately differentiated, with 1 out of 18 lymph nodes positive for metastatic disease.  Post-treatment colonoscopy/EGD by Dr. [name] on 05/15/2015 was normal and repeat is recommended in 2022.  He has completed 5 years worth of surveillance in accordance with the NCCN guidelines as of 2017.

## 2016-08-05 NOTE — Assessment & Plan Note (Addendum)
Iron deficiency anemia, requiring IV iron replacement therapy.  Labs on 07/23/2016: CBC diff, BMET, iron/TIBC, ferritin.  I personally reviewed and went over laboratory results with the patient.  The results are noted within this dictation.   HGB was WNL, but ferritin was low.  Therefore, he received 510 mg of feraheme on 07/30/2016.  Labs in 3 and 6 months: CBC diff, BMET, iron/TIBC, ferritin.  He complains of early satiety, post-prandial bloating and discomfort.  He has GI follow-up on 08/11/2016.  I wonder if he is experiencing gastric motility issue(s).  I thought about Reglan, but the patient reports trying this in the past with little to no benefit.  Will defer to GI.  Return in 6 months for follow-up.

## 2016-08-11 ENCOUNTER — Ambulatory Visit (INDEPENDENT_AMBULATORY_CARE_PROVIDER_SITE_OTHER): Payer: Medicare Other | Admitting: Nurse Practitioner

## 2016-08-11 ENCOUNTER — Encounter: Payer: Self-pay | Admitting: Nurse Practitioner

## 2016-08-11 VITALS — BP 123/82 | HR 85 | Temp 97.0°F | Ht 69.0 in | Wt 155.0 lb

## 2016-08-11 DIAGNOSIS — Z85038 Personal history of other malignant neoplasm of large intestine: Secondary | ICD-10-CM

## 2016-08-11 DIAGNOSIS — R1012 Left upper quadrant pain: Secondary | ICD-10-CM | POA: Diagnosis not present

## 2016-08-11 MED ORDER — SUCRALFATE 1 GM/10ML PO SUSP: 1.0000 g | Freq: Four times a day (QID) | ORAL | 1 refills | Status: DC

## 2016-08-11 MED ORDER — LANSOPRAZOLE 30 MG PO CPDR: 30.0000 mg | DELAYED_RELEASE_CAPSULE | Freq: Every day | ORAL | 3 refills | Status: DC

## 2016-08-11 NOTE — Assessment & Plan Note (Signed)
The patient has history of stage III colon cancer status post surgical resection and chemotherapy. He has had multiple small bowel obstructions and ileus episodes the most recent of which was just after his last office visit with [place]. He does not quite seen as sick or in pain as he was at his last visit. I doubt that he has an ileus or small bowel obstruction today. His symptoms really are characteristic of GERD, gastritis, other upper GI etiology. See further management below. Return for follow-up in 6 weeks. Continue to see oncology.

## 2016-08-11 NOTE — Patient Instructions (Signed)
1. Stop taking Dexilant. 2. Start taking Prevacid 30 mg once a day on an empty stomach. 3. I have also sent him Carafate to your pharmacy. You can take this as needed for breakthrough symptoms. 4. Return for follow-up in 6 weeks

## 2016-08-11 NOTE — Assessment & Plan Note (Signed)
Notes continued left upper quadrant abdominal pain. His pain is significantly improved compared to last time I saw him. He does not quite seem as severe/symptomatic or toxic. His symptoms really are hallmark of GERD or gastritis type pain. Left upper quadrant pain, worse after eating, bitter/sour taste. He is currently on Dexilant. I will have him stop Dexilant and start Prevacid 30 mg daily. I will send in Carafate as needed. Return for follow-up in 6 weeks to further evaluate. ER precautions given. Call if any severe or worsening symptoms before then.

## 2016-08-11 NOTE — Progress Notes (Signed)
Referring Provider: [name], MD Primary Care Physician:  [name], MD Primary GI:  Dr. [name]  Chief Complaint  Patient presents with  . Abdominal Pain    left abd, gets worse when eats-shoot up into left shoulder, only able to eat small amt  . Nausea  . Anorexia    HPI:   [name] is a 66 y.o. male who presents for abdominal pain, nausea. The patient was last seen in our office 06/11/2016 for left upper quadrant pain, history of colon cancer, small bowel obstruction. 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 Continues to follow with oncology. History of chronic ileus and small bowel obstruction and most recently had abdominal surgery at [facility] consisting of exploratory laparotomy in December 2017 with lysis of adhesions discharged on TPN and gradual advancement of diet. Prior to his last visit he presented to the emergency department with abdominal pain and was given an NG tube with noted significant NG output and started on TPN. Bowel function slowly returned and his diet was advanced from nothing by mouth to liquids to soft which he tolerated and was subsequently discharged home on smaller meals. Recommended outpatient GI follow-up and primary care follow-up.  At his last visit he was doing fair, better than during hospitalization but still with acute on chronic left upper quadrant abdominal pain deemed constant, significant nausea worse over the previous couple days but without vomiting. He was not having a good appetite and had worsened since day of discharge. Decreased oral intake, bowel movements mostly diarrhea. Recommended labs including CBC, CMP, lipase, and a stat CT of the abdomen and pelvis with contrast. ER precautions given.  CT abdomen found no acute changes within the abdomen or pelvis, postsurgical changes from partial gastrectomy  and gastrojejunostomy, retained contrast without evidence of obstruction.  He presented to the ER 2 days later and was admitted for abdominal pain, vomiting, ileus and partial small bowel obstruction. NG tube was placed in addition to bowel rest and his bowel function returned. NG tube was removed and was able to tolerate a diet.  Follows with hematology/oncology for iron deficiency anemia and history of adenocarcinoma of the colon. He last saw oncology 08/05/2016 with iron deficiency anemia requiring IV replacement of iron, hemoglobin normal but ferritin low and he received 510 mg IV ferriheme. Recommended labs in 3 and 6 months. 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 Next due for colonoscopy/endoscopy in 2022.  Today he states he's doing "fair, I reckon." His abdominal pain isn't too good, getting worse over time and has been worsening for the last 4 weeks, no sudden worsening. Pain is LUQ, described as sharp. Pain is worse if he eats/drinks anything. Admits bitter/sour taste in his mouth. Currently on Dexilant daily. Pain is constant. A lot of nausea, no vomiting. Denies hematochezia, melena, fever, chills. Lost 8 lbs over the course of a week but this returned over the following couple weeks. He thinks this is related to a lot of fluid. Is having worsening fatigue/weakness. Has chronic dyspnea as of the past 3-4 months. Has a bowel movement about daily, typically watery/loose; last bowel movement was last night. Denies chest pain, dyspnea, dizziness, lightheadedness, syncope, near syncope. Denies any other upper or lower GI symptoms.    Past Medical History:  Diagnosis Date  .  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 Anemia   . Anxiety   . Arthritis   . Barrett's esophagus   . Blood transfusion   . Bowel obstruction ([place]) 05/13/2012   Recurrent  . Bronchitis   . Chest  pain at rest   . Chronic abdominal pain   . Erosive esophagitis   . ETOH abuse    quit 03/2010  . GERD (gastroesophageal reflux disease)   . Hx of Clostridium difficile infection 01/2012  . Hypertension   . Ileus ([place])   . Iron deficiency anemia 03/23/2016  . Obstruction of bowel ([place]) 03/03/14  . Osteoporosis   . Personal history of PE (pulmonary embolism) 10/01/2010  . Pneumonia   . Pulmonary embolism ([place]) 02/2010  . Recurrent upper respiratory infection (URI)   . S/P endoscopy September 28, 2010   erosive reflux esophagitis, Billroth I anatomy  . 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 Seizures ([place])   . Shortness of breath   . Sleep apnea   . TIA (transient ischemic attack) 10/11  . Vitamin B12 deficiency     Past Surgical History:  Procedure Laterality Date  . ABDOMINAL ADHESION SURGERY  03/04/15   @ [facility]  . ABDOMINAL EXPLORATION SURGERY    . abdominal sugery     for bowel obstruction x 8, all in 1980s, except for one in 07/2010  . APPENDECTOMY  1980s  . Billroth 1 hemigastrectomy  1980s   per patient for benign duodenal tumor  . CARDIAC CATHETERIZATION  07/17/2012  . CHOLECYSTECTOMY  1980s  . COLON SURGERY  May 2012   left hemicolectomy, colon cancer found at time of surgery for bowel obstruction  . COLONOSCOPY  03/18/2011   anastomosis at 35cm. Several adenomatous polyps removed. Sigmoid diverticulosis. Next TCS 02/2013  . COLONOSCOPY N/A 07/24/2012   XIP:[id number] post segmental resection with normal-appearing colonic anastomosis aside from an adjacent polyp-removed as described above. Rectal polyp-removed as described above. CT findings appear to have been artifactual. tubular adenomas/prolapsed type polyp.  . COLONOSCOPY N/A 05/15/2015   Procedure: COLONOSCOPY;  Surgeon: [name], MD;  Location: [facility];  Service: Endoscopy;  Laterality: N/A;  . ESOPHAGOGASTRODUODENOSCOPY  09/28/2010  . ESOPHAGOGASTRODUODENOSCOPY  12/01/2010   Cervical web status post dilation, erosive  esophagitis, B1 hemigastrectomy, inflamed anastomosis  . ESOPHAGOGASTRODUODENOSCOPY  04/16/2011   excoriation at GEJ c/w trauma/M-W tear, friable gastric anastomosis, dilation efferent limb  . ESOPHAGOGASTRODUODENOSCOPY N/A 06/03/2014   Dr.[name]- cervcal esopphageal web s/p dilation. abnormal distal esophagus bx= barretts esophagus  . ESOPHAGOGASTRODUODENOSCOPY N/A 05/15/2015   Procedure: ESOPHAGOGASTRODUODENOSCOPY (EGD);  Surgeon: [name], MD;  Location: [facility];  Service: Endoscopy;  Laterality: N/A;  230  . ESOPHAGOGASTRODUODENOSCOPY (EGD) WITH ESOPHAGEAL DILATION  02/25/2012   LZJ:[id number] esophageal web-s/p dilation anddisruption as described above. Status post prior gastric with Billroth I configuration. Abnormal gastric mucosa at the anastomosis. Gastric biopsy showed mild chronic inflammation but no H. pylori   . HERNIA REPAIR     right inguinal  . MALONEY DILATION N/A 06/03/2014   Procedure: [name] DILATION;  Surgeon: [name], MD;  [facility]: [facility];  Service: Endoscopy;  Laterality: N/A;  [name] DILATION N/A 05/15/2015   Procedure: [name] DILATION;  Surgeon: [name], MD;  [facility]: [facility];  Service: Endoscopy;  Laterality: N/A;  . PORTACATH PLACEMENT    . SAVORY DILATION N/A 06/03/2014   Procedure: SAVORY DILATION;  Surgeon: [name],  MD;  [facility]: [facility];  Service: Endoscopy;  Laterality: N/A;    Current Outpatient Prescriptions  Medication Sig Dispense Refill  . acetaminophen (TYLENOL) 500 MG tablet Take 500 mg by mouth every 6 (six) hours as needed.    [name] acidophilus (RISAQUAD) CAPS capsule Take 1 capsule by mouth daily.    [name] albuterol (PROAIR HFA) 108 (90 BASE) MCG/ACT inhaler Inhale 1-2 puffs into the lungs every 6 (six) hours as needed for wheezing or shortness of breath.     [name] atorvastatin (LIPITOR) 20 MG tablet Take 20 mg by mouth at bedtime.     . carvedilol (COREG) 6.25 MG tablet Take 6.25 mg by mouth 2 (two) times daily  with a meal.    . cholecalciferol (VITAMIN D) 1000 UNITS tablet Take 1,000 Units by mouth daily.     . Copper Gluconate 2 MG CAPS Take 1 capsule by mouth daily. 30 capsule 5  . cyanocobalamin (,VITAMIN B-12,) 1000 MCG/ML injection Inject 1,000 mcg into the muscle every 30 (thirty) days.    [name] dexlansoprazole (DEXILANT) 60 MG capsule Take 60 mg by mouth daily.    [name] docusate sodium (COLACE) 100 MG capsule Take 100 mg by mouth daily as needed.     . enoxaparin (LOVENOX) 60 MG/0.6ML injection Inject 60 mg into the skin at bedtime.     . ferrous sulfate 325 (65 FE) MG tablet Take 1 tablet (325 mg total) by mouth 2 (two) times daily with a meal. 60 tablet 0  . levETIRAcetam (KEPPRA) 750 MG tablet Take 750 mg by mouth 2 (two) times daily.    [name] lidocaine-prilocaine (EMLA) cream Apply 1 application topically as needed (prior to accessing port).    . LORazepam (ATIVAN) 1 MG tablet Take 1 tablet (1 mg total) by mouth every 8 (eight) hours as needed for anxiety. 30 tablet 0  . magnesium oxide (MAGNESIUM-OXIDE) 400 (241.3 Mg) MG tablet Take 400 mg by mouth 2 (two) times daily.    . Multiple Vitamin (MULTIVITAMIN WITH MINERALS) TABS tablet Take 1 tablet by mouth daily.    . Nutritional Supplements (ENSURE PLUS HN) LIQD Take 1 Bottle by mouth every other day.    . polyethylene glycol (MIRALAX / GLYCOLAX) packet Take 17 g by mouth daily as needed for mild constipation or moderate constipation.    . prochlorperazine (COMPAZINE) 10 MG tablet Take 10 mg by mouth every 6 (six) hours as needed for nausea or vomiting.     . promethazine (PHENERGAN) 25 MG tablet Take 1 tablet (25 mg total) by mouth every 6 (six) hours as needed for nausea. 10 tablet 0  . tamsulosin (FLOMAX) 0.4 MG CAPS capsule Take 0.4 mg by mouth at bedtime.     . Vitamin D, Ergocalciferol, (DRISDOL) 50000 units CAPS capsule Take 50,000 Units by mouth every Sunday.    . zoledronic acid (RECLAST) 5 MG/100ML SOLN injection Inject 5 mg into the vein See  admin instructions. Pt gets a dose yearly.     No current facility-administered medications for this visit.    Facility-Administered Medications Ordered in Other Visits  Medication Dose Route Frequency Provider Last Rate Last Dose  . heparin lock flush 100 unit/mL  500 Units Intravenous Once Kefalas, Thomas S, PA-C        Allergies as of 08/11/2016  . (No Known Allergies)    Family History  Problem Relation Age of Onset  . Hypertension Mother   . Arthritis Mother   . Pneumonia Mother   .  Hypertension Father   . Heart attack Father   . Colon cancer Neg Hx     Social History   Social History  . Marital status: Married    Spouse name: N/A  . Number of children: 3  . Years of education: N/A   Occupational History  .  [employer]   Social History Main Topics  . Smoking status: Former Smoker    Packs/day: 0.50    Years: 40.00    Types: Cigarettes    Quit date: 12/20/2012  . Smokeless tobacco: Never Used  . Alcohol use No  . Drug use: No  . Sexual activity: No   Other Topics Concern  . None   Social History Narrative  . None    Review of Systems: General: Negative for anorexia, weight loss, fever, chills, fatigue, weakness. ENT: Negative for hoarseness, difficulty swallowing. CV: Negative for chest pain, angina, palpitations, eripheral edema.  Respiratory: Negative for dyspnea at rest, cough, sputum, wheezing.  GI: See history of present illness. Endo: Negative for unusual weight change.  Heme: Negative for bruising or bleeding. Allergy: Negative for rash or hives.   Physical Exam: BP 123/82   Pulse 85   Temp 97 F (36.1 C) (Oral)   Ht '5\' 9"'$  (1.753 m)   Wt 155 lb (70.3 kg)   BMI 22.89 kg/m  General:   Alert and oriented. Pleasant and cooperative. Well-nourished and well-developed.  Eyes:  Without icterus, sclera clear and conjunctiva pink.  Ears:  Normal auditory acuity. Cardiovascular:  S1, S2 present without murmurs appreciated. Extremities  without clubbing or edema. Respiratory:  Clear to auscultation bilaterally. No wheezes, rales, or rhonchi. No distress.  Gastrointestinal:  +BS, soft, non-tender and non-distended. No HSM noted. No guarding or rebound. No masses appreciated.  Rectal:  Deferred  Musculoskalatal:  Symmetrical without gross deformities. Neurologic:  Alert and oriented x4;  grossly normal neurologically. Psych:  Alert and cooperative. Normal mood and affect. Heme/Lymph/Immune: No excessive bruising noted.    08/11/2016 3:28 PM   Disclaimer: This note was dictated with voice recognition software. Similar sounding words can inadvertently be transcribed and may not be corrected upon review.

## 2016-08-12 ENCOUNTER — Encounter: Payer: Self-pay | Admitting: Internal Medicine

## 2016-08-12 NOTE — Progress Notes (Signed)
CC'ED TO PCP 

## 2016-08-21 ENCOUNTER — Encounter (HOSPITAL_COMMUNITY): Payer: Self-pay | Admitting: *Deleted

## 2016-08-21 ENCOUNTER — Emergency Department (HOSPITAL_COMMUNITY)
Admission: EM | Admit: 2016-08-21 | Discharge: 2016-08-22 | Disposition: A | Discharge status: Home / Self Care | Payer: Medicare Other | Attending: Emergency Medicine | Admitting: Emergency Medicine

## 2016-08-21 ENCOUNTER — Emergency Department (HOSPITAL_COMMUNITY): Payer: Medicare Other

## 2016-08-21 DIAGNOSIS — N183 Chronic kidney disease, stage 3 (moderate): Secondary | ICD-10-CM | POA: Diagnosis not present

## 2016-08-21 DIAGNOSIS — Z79899 Other long term (current) drug therapy: Secondary | ICD-10-CM | POA: Diagnosis not present

## 2016-08-21 DIAGNOSIS — R1012 Left upper quadrant pain: Secondary | ICD-10-CM | POA: Diagnosis not present

## 2016-08-21 DIAGNOSIS — Z87891 Personal history of nicotine dependence: Secondary | ICD-10-CM | POA: Insufficient documentation

## 2016-08-21 DIAGNOSIS — C189 Malignant neoplasm of colon, unspecified: Secondary | ICD-10-CM | POA: Insufficient documentation

## 2016-08-21 DIAGNOSIS — R112 Nausea with vomiting, unspecified: Secondary | ICD-10-CM | POA: Insufficient documentation

## 2016-08-21 DIAGNOSIS — R197 Diarrhea, unspecified: Secondary | ICD-10-CM | POA: Diagnosis not present

## 2016-08-21 DIAGNOSIS — I509 Heart failure, unspecified: Secondary | ICD-10-CM | POA: Diagnosis not present

## 2016-08-21 DIAGNOSIS — I130 Hypertensive heart and chronic kidney disease with heart failure and stage 1 through stage 4 chronic kidney disease, or unspecified chronic kidney disease: Secondary | ICD-10-CM | POA: Insufficient documentation

## 2016-08-21 DIAGNOSIS — I959 Hypotension, unspecified: Secondary | ICD-10-CM | POA: Insufficient documentation

## 2016-08-21 DIAGNOSIS — R0602 Shortness of breath: Secondary | ICD-10-CM | POA: Diagnosis present

## 2016-08-21 LAB — CBC WITH DIFFERENTIAL/PLATELET
BASOS ABS: 0 10*3/uL (ref 0.0–0.1)
BASOS PCT: 0 %
EOS ABS: 0.2 10*3/uL (ref 0.0–0.7)
Eosinophils Relative: 2 %
HEMATOCRIT: 41.8 % (ref 39.0–52.0)
HEMOGLOBIN: 13.8 g/dL (ref 13.0–17.0)
Lymphocytes Relative: 33 %
Lymphs Abs: 2.7 10*3/uL (ref 0.7–4.0)
MCH: 29.7 pg (ref 26.0–34.0)
MCHC: 33 g/dL (ref 30.0–36.0)
MCV: 90.1 fL (ref 78.0–100.0)
Monocytes Absolute: 0.8 10*3/uL (ref 0.1–1.0)
Monocytes Relative: 9 %
NEUTROS ABS: 4.7 10*3/uL (ref 1.7–7.7)
NEUTROS PCT: 56 %
Platelets: 162 10*3/uL (ref 150–400)
RBC: 4.64 MIL/uL (ref 4.22–5.81)
RDW: 14.1 % (ref 11.5–15.5)
WBC: 8.4 10*3/uL (ref 4.0–10.5)

## 2016-08-21 LAB — URINALYSIS, ROUTINE W REFLEX MICROSCOPIC
Bilirubin Urine: NEGATIVE
GLUCOSE, UA: NEGATIVE mg/dL
HGB URINE DIPSTICK: NEGATIVE
Ketones, ur: NEGATIVE mg/dL
LEUKOCYTES UA: NEGATIVE
Nitrite: NEGATIVE
Protein, ur: NEGATIVE mg/dL
SPECIFIC GRAVITY, URINE: 1.024 (ref 1.005–1.030)
pH: 5 (ref 5.0–8.0)

## 2016-08-21 LAB — COMPREHENSIVE METABOLIC PANEL
ALBUMIN: 3.7 g/dL (ref 3.5–5.0)
ALK PHOS: 79 U/L (ref 38–126)
ALT: 17 U/L (ref 17–63)
ANION GAP: 8 (ref 5–15)
AST: 24 U/L (ref 15–41)
BUN: 8 mg/dL (ref 6–20)
CALCIUM: 8.4 mg/dL — AB (ref 8.9–10.3)
CO2: 23 mmol/L (ref 22–32)
CREATININE: 1.43 mg/dL — AB (ref 0.61–1.24)
Chloride: 113 mmol/L — ABNORMAL HIGH (ref 101–111)
GFR calc Af Amer: 58 mL/min — ABNORMAL LOW (ref >60)
GFR calc non Af Amer: 50 mL/min — ABNORMAL LOW (ref >60)
GLUCOSE: 73 mg/dL (ref 65–99)
Potassium: 4.7 mmol/L (ref 3.5–5.1)
SODIUM: 144 mmol/L (ref 135–145)
Total Bilirubin: 0.5 mg/dL (ref 0.3–1.2)
Total Protein: 6.6 g/dL (ref 6.5–8.1)

## 2016-08-21 LAB — LIPASE, BLOOD: Lipase: 25 U/L (ref 11–51)

## 2016-08-21 LAB — I-STAT CG4 LACTIC ACID, ED: Lactic Acid, Venous: 1.02 mmol/L (ref 0.5–1.9)

## 2016-08-21 MED ORDER — PROMETHAZINE HCL 12.5 MG PO TABS: 12.5000 mg | ORAL_TABLET | Freq: Four times a day (QID) | ORAL | 0 refills | Status: DC | PRN

## 2016-08-21 MED ORDER — HYDROMORPHONE HCL 1 MG/ML IJ SOLN
1.0000 mg | Freq: Once | INTRAMUSCULAR | Status: AC
  Administered 2016-08-21: 1 mg via INTRAVENOUS
  Filled 2016-08-21: qty 1

## 2016-08-21 MED ORDER — SODIUM CHLORIDE 0.9 % IV BOLUS (SEPSIS)
1000.0000 mL | Freq: Once | INTRAVENOUS | Status: AC
  Administered 2016-08-21: 1000 mL via INTRAVENOUS

## 2016-08-21 MED ORDER — ONDANSETRON HCL 4 MG/2ML IJ SOLN
4.0000 mg | Freq: Once | INTRAMUSCULAR | Status: AC
  Administered 2016-08-21: 4 mg via INTRAVENOUS
  Filled 2016-08-21: qty 2

## 2016-08-21 NOTE — ED Triage Notes (Signed)
Pt with RUQ burning and sharp pain since yesterday, diarrhea x 4 and emesis x 2 today.  Hx of pain like today but lower, dx of partial bowel obstruction.

## 2016-08-21 NOTE — ED Provider Notes (Signed)
[facility] DEPT Provider Note   CSN: [id number] Arrival date & time: 08/21/16  1950     History   Chief Complaint Chief Complaint  Patient presents with  . Abdominal Pain    HPI [name] is a 66 y.o. male.  HPI  66 y/o male - hx of colon CA as well as stomach mass int eh past - has had multiple SBO and is s/p multiple surgery / ex lap for same - has had admissions almost monthly for the last year b/c of possible SBO vs ileus - at this time he states he has had 4 days of n/v/d and has had 4 episodes of watery brown diarrhea today 7 hours ago and has had several episodes of vomiting as well - has had increasedc amounts of LUQ ttp and pain.  He has no f/c/cough/sob / cp.  No back pain - no blood in stools.  Past Medical History:  Diagnosis Date  . 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 Anemia   . Anxiety   . Arthritis   . Barrett's esophagus   . Blood transfusion   . Bowel obstruction ([place]) 05/13/2012   Recurrent  . Bronchitis   . Chest pain at rest   . Chronic abdominal pain   . Erosive esophagitis   . ETOH abuse    quit 03/2010  . GERD (gastroesophageal reflux disease)   . Hx of Clostridium difficile infection 01/2012  . Hypertension   . Ileus ([place])   . Iron deficiency anemia 03/23/2016  . Obstruction of bowel ([place]) 03/03/14  . Osteoporosis   . Personal history of PE (pulmonary embolism) 10/01/2010  . Pneumonia   . Pulmonary embolism ([place]) 02/2010  . Recurrent upper respiratory infection (URI)   . S/P endoscopy September 28, 2010   erosive reflux esophagitis, Billroth I anatomy  . 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 Seizures ([place])   . Shortness of breath   . Sleep apnea   . TIA (transient ischemic attack) 10/11  . Vitamin B12 deficiency     Patient Active Problem List   Diagnosis Date Noted  . CKD (chronic kidney disease) stage 3, GFR 30-59 ml/min 06/14/2016  . Patient has nasogastric tube   . Ileus ([place]) 05/15/2016  . Chronic  diastolic heart failure ([place]) 05/15/2016  . Iron deficiency anemia 03/23/2016  . Acute renal failure ([place]) 03/19/2016  . Normocytic anemia 03/19/2016  . Hypotension 03/19/2016  . Anemia   . History of colon cancer, stage III   . Hx of colon cancer, stage III   . History of colonic polyps   . Barrett's esophagus without dysplasia   . Dysphagia   . Essential hypertension 11/30/2014  . Dysphagia, pharyngoesophageal phase   . Mucosal abnormality of esophagus   . SBO (small bowel obstruction) ([place]) 09/17/2013  . Rectal bleeding 07/11/2012  . Bowel obstruction ([place]) 07/11/2012  . Abnormal CT scan, colon 07/11/2012  . Esophageal dysphagia 02/22/2012  . H/O Clostridium difficile infection 02/22/2012  . Diarrhea 09/10/2011  . Cellulitis 09/10/2011  . Nausea 06/07/2011  . Abdominal distention 06/07/2011  . HTN (hypertension) 06/07/2011  . Partial small bowel obstruction ([place]) 05/31/2011  . GI bleed 05/27/2011  . Coagulopathy ([place]) 05/27/2011  . Gastroenteritis 05/27/2011  . Small bowel obstruction ([place]) 05/13/2011  . Left sided abdominal pain 05/13/2011  . Pulmonary embolism ([place]) 01/17/2011  . Chest pain at rest 01/17/2011  . Pneumonia 12/11/2010  . Hematemesis 12/05/2010  .  Chronic abdominal pain 12/05/2010  . Pancytopenia due to antineoplastic chemotherapy ([place]) 12/05/2010  . Coffee ground emesis 11/29/2010  . Esophageal reflux disease 11/15/2010  . 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 Personal history of PE (pulmonary embolism) 10/01/2010  . Bronchitis, acute 10/01/2010  . Erosive esophagitis 10/01/2010  . Fever chills 09/29/2010  . Bleeding gastrointestinal 09/26/2010  . Supratherapeutic INR 09/26/2010  . Abdominal pain 09/26/2010  . Nausea and vomiting 09/26/2010  . Anemia, chronic disease 09/26/2010  . Adenocarcinoma of colon with mucinous features 09/22/2010    Past Surgical History:  Procedure Laterality Date  . ABDOMINAL ADHESION SURGERY  03/04/15   @ [facility]  .  ABDOMINAL EXPLORATION SURGERY    . abdominal sugery     for bowel obstruction x 8, all in 1980s, except for one in 07/2010  . APPENDECTOMY  1980s  . Billroth 1 hemigastrectomy  1980s   per patient for benign duodenal tumor  . CARDIAC CATHETERIZATION  07/17/2012  . CHOLECYSTECTOMY  1980s  . COLON SURGERY  May 2012   left hemicolectomy, colon cancer found at time of surgery for bowel obstruction  . COLONOSCOPY  03/18/2011   anastomosis at 35cm. Several adenomatous polyps removed. Sigmoid diverticulosis. Next TCS 02/2013  . COLONOSCOPY N/A 07/24/2012   YCX:[id number] post segmental resection with normal-appearing colonic anastomosis aside from an adjacent polyp-removed as described above. Rectal polyp-removed as described above. CT findings appear to have been artifactual. tubular adenomas/prolapsed type polyp.  . COLONOSCOPY N/A 05/15/2015   Procedure: COLONOSCOPY;  Surgeon: [name], MD;  Location: [facility];  Service: Endoscopy;  Laterality: N/A;  . ESOPHAGOGASTRODUODENOSCOPY  09/28/2010  . ESOPHAGOGASTRODUODENOSCOPY  12/01/2010   Cervical web status post dilation, erosive esophagitis, B1 hemigastrectomy, inflamed anastomosis  . ESOPHAGOGASTRODUODENOSCOPY  04/16/2011   excoriation at GEJ c/w trauma/M-W tear, friable gastric anastomosis, dilation efferent limb  . ESOPHAGOGASTRODUODENOSCOPY N/A 06/03/2014   Dr.[name]- cervcal esopphageal web s/p dilation. abnormal distal esophagus bx= barretts esophagus  . ESOPHAGOGASTRODUODENOSCOPY N/A 05/15/2015   Procedure: ESOPHAGOGASTRODUODENOSCOPY (EGD);  Surgeon: [name], MD;  Location: [facility];  Service: Endoscopy;  Laterality: N/A;  230  . ESOPHAGOGASTRODUODENOSCOPY (EGD) WITH ESOPHAGEAL DILATION  02/25/2012   UDJ:[id number] esophageal web-s/p dilation anddisruption as described above. Status post prior gastric with Billroth I configuration. Abnormal gastric mucosa at the anastomosis. Gastric biopsy showed mild chronic inflammation but no H.  pylori   . HERNIA REPAIR     right inguinal  . MALONEY DILATION N/A 06/03/2014   Procedure: [name] DILATION;  Surgeon: [name], MD;  Location: [facility];  Service: Endoscopy;  Laterality: N/A;  [name] DILATION N/A 05/15/2015   Procedure: [name] DILATION;  Surgeon: [name], MD;  Location: [facility];  Service: Endoscopy;  Laterality: N/A;  . PORTACATH PLACEMENT    . SAVORY DILATION N/A 06/03/2014   Procedure: SAVORY DILATION;  Surgeon: [name], MD;  Location: [facility];  Service: Endoscopy;  Laterality: N/A;       Home Medications    Prior to Admission medications   Medication Sig Start Date End Date Taking? Authorizing Provider  acetaminophen (TYLENOL) 500 MG tablet Take 500 mg by mouth every 6 (six) hours as needed.    [provider]  acidophilus (RISAQUAD) CAPS capsule Take 1 capsule by mouth daily.    [provider]  albuterol (PROAIR HFA) 108 (90 BASE) MCG/ACT inhaler Inhale 1-2 puffs into the lungs every 6 (six) hours as needed for  wheezing or shortness of breath.     [provider]  atorvastatin (LIPITOR) 20 MG tablet Take 20 mg by mouth at bedtime.     [provider]  carvedilol (COREG) 6.25 MG tablet Take 6.25 mg by mouth 2 (two) times daily with a meal.    [provider]  cholecalciferol (VITAMIN D) 1000 UNITS tablet Take 1,000 Units by mouth daily.     [provider]  Copper Gluconate 2 MG CAPS Take 1 capsule by mouth daily. 05/03/16   Baird Cancer, PA-C  cyanocobalamin (,VITAMIN B-12,) 1000 MCG/ML injection Inject 1,000 mcg into the muscle every 30 (thirty) days.    [provider]  dexlansoprazole (DEXILANT) 60 MG capsule Take 60 mg by mouth daily.    [provider]  docusate sodium (COLACE) 100 MG capsule Take 100 mg by mouth daily as needed.     [provider]  enoxaparin (LOVENOX) 60 MG/0.6ML injection Inject 60 mg into the skin at bedtime.      [provider]  ferrous sulfate 325 (65 FE) MG tablet Take 1 tablet (325 mg total) by mouth 2 (two) times daily with a meal. 03/21/16   Thurnell Lose, MD  lansoprazole (PREVACID) 30 MG capsule Take 1 capsule (30 mg total) by mouth daily at 12 noon. 08/11/16   Carlis Stable, NP  levETIRAcetam (KEPPRA) 750 MG tablet Take 750 mg by mouth 2 (two) times daily.    [provider]  lidocaine-prilocaine (EMLA) cream Apply 1 application topically as needed (prior to accessing port).    [provider]  LORazepam (ATIVAN) 1 MG tablet Take 1 tablet (1 mg total) by mouth every 8 (eight) hours as needed for anxiety. 05/23/16   Elgergawy, Silver Huguenin, MD  magnesium oxide (MAGNESIUM-OXIDE) 400 (241.3 Mg) MG tablet Take 400 mg by mouth 2 (two) times daily.    [provider]  Multiple Vitamin (MULTIVITAMIN WITH MINERALS) TABS tablet Take 1 tablet by mouth daily.    [provider]  Nutritional Supplements (ENSURE PLUS HN) LIQD Take 1 Bottle by mouth every other day.    [provider]  polyethylene glycol (MIRALAX / GLYCOLAX) packet Take 17 g by mouth daily as needed for mild constipation or moderate constipation.    [provider]  prochlorperazine (COMPAZINE) 10 MG tablet Take 10 mg by mouth every 6 (six) hours as needed for nausea or vomiting.     [provider]  promethazine (PHENERGAN) 12.5 MG tablet Take 1 tablet (12.5 mg total) by mouth every 6 (six) hours as needed for nausea or vomiting. 08/21/16   Noemi Chapel, MD  sucralfate (CARAFATE) 1 GM/10ML suspension Take 10 mLs (1 g total) by mouth 4 (four) times daily. 08/11/16   Carlis Stable, NP  tamsulosin (FLOMAX) 0.4 MG CAPS capsule Take 0.4 mg by mouth at bedtime.     [provider]  Vitamin D, Ergocalciferol, (DRISDOL) 50000 units CAPS capsule Take 50,000 Units by mouth every Sunday.    [provider]  zoledronic acid (RECLAST) 5 MG/100ML SOLN injection Inject 5 mg into  the vein See admin instructions. Pt gets a dose yearly.    [provider]    Family History Family History  Problem Relation Age of Onset  . Hypertension Mother   . Arthritis Mother   . Pneumonia Mother   . Hypertension Father   . Heart attack Father   . Colon cancer Neg Hx  Social History Social History  Substance Use Topics  . Smoking status: Former Smoker    Packs/day: 0.50    Years: 40.00    Types: Cigarettes    Quit date: 12/20/2012  . Smokeless tobacco: Never Used  . Alcohol use No     Allergies   Patient has no known allergies.   Review of Systems Review of Systems  All other systems reviewed and are negative.    Physical Exam Updated Vital Signs BP (!) 178/103 (BP Location: Right Arm)   Pulse 74   Temp 98.1 F (36.7 C) (Oral)   Resp 18   Ht 5\' 9"  (1.753 m)   Wt 70.3 kg (155 lb)   SpO2 99%   BMI 22.89 kg/m   Physical Exam  Constitutional: He appears well-developed and well-nourished. No distress.  HENT:  Head: Normocephalic and atraumatic.  Mouth/Throat: Oropharynx is clear and moist. No oropharyngeal exudate.  Eyes: Conjunctivae and EOM are normal. Pupils are equal, round, and reactive to light. Right eye exhibits no discharge. Left eye exhibits no discharge. No scleral icterus.  Neck: Normal range of motion. Neck supple. No JVD present. No thyromegaly present.  Cardiovascular: Normal rate, regular rhythm, normal heart sounds and intact distal pulses.  Exam reveals no gallop and no friction rub.   No murmur heard. Pulmonary/Chest: Effort normal and breath sounds normal. No respiratory distress. He has no wheezes. He has no rales.  Abdominal: Soft. He exhibits no distension and no mass. There is no tenderness.  Dec BS - mild distention and tympanitic sounds to percussion - has no guarding - LUQ ttp but no other abd ttp.  Musculoskeletal: Normal range of motion. He exhibits no edema or tenderness.  Lymphadenopathy:    He has no  cervical adenopathy.  Neurological: He is alert. Coordination normal.  Skin: Skin is warm and dry. No rash noted. No erythema.  Psychiatric: He has a normal mood and affect. His behavior is normal.  Nursing note and vitals reviewed.    ED Treatments / Results  Labs (all labs ordered are listed, but only abnormal results are displayed) Labs Reviewed  COMPREHENSIVE METABOLIC PANEL - Abnormal; Notable for the following:       Result Value   Chloride 113 (*)    Creatinine, Ser 1.43 (*)    Calcium 8.4 (*)    GFR calc non Af Amer 50 (*)    GFR calc Af Amer 58 (*)    All other components within normal limits  CBC WITH DIFFERENTIAL/PLATELET  LIPASE, BLOOD  URINALYSIS, ROUTINE W REFLEX MICROSCOPIC  I-STAT CG4 LACTIC ACID, ED     Radiology Dg Abd Acute W/chest  Result Date: 08/21/2016 CLINICAL DATA:  Acute onset of nausea, vomiting and diarrhea. Right upper quadrant burning. Initial encounter. EXAM: DG ABDOMEN ACUTE W/ 1V CHEST COMPARISON:  Abdominal radiograph performed 06/13/2016, and chest radiograph performed 05/19/2016 FINDINGS: The lungs are well-aerated and clear. There is no evidence of focal opacification, pleural effusion or pneumothorax. The cardiomediastinal silhouette is within normal limits. A left-sided chest port is noted ending about the mid SVC. Air-filled loops of small and large bowel are seen, with a few air-fluid levels noted on the upright view. This may reflect mild bowel dysmotility. There is no definite evidence of bowel obstruction. No free intra-abdominal air is identified on the provided upright view. Scattered clips are seen about the abdomen. A bowel suture line is noted at the left mid abdomen. No acute osseous abnormalities are seen; the  sacroiliac joints are unremarkable in appearance. IMPRESSION: 1. Air-filled loops of small and large bowel, with a few air-fluid levels noted. This may reflect mild bowel dysmotility. No definite evidence of bowel obstruction. No  free intra-abdominal air seen. 2. No acute cardiopulmonary process identified. Electronically Signed   By: Garald Balding M.D.   On: 08/21/2016 21:54    Procedures Procedures (including critical care time)  Medications Ordered in ED Medications  sodium chloride 0.9 % bolus 1,000 mL (not administered)  sodium chloride 0.9 % bolus 1,000 mL (1,000 mLs Intravenous New Bag/Given 08/21/16 2051)  ondansetron (ZOFRAN) injection 4 mg (4 mg Intravenous Given 08/21/16 2051)  HYDROmorphone (DILAUDID) injection 1 mg (1 mg Intravenous Given 08/21/16 2131)     Initial Impression / Assessment and Plan / ED Course  I have reviewed the triage vital signs and the nursing notes.  Pertinent labs & imaging results that were available during my care of the patient were reviewed by me and considered in my medical decision making (see chart for details).     Pt has hx of SBO - he has active BS and is having stools today =- AAS instead of CT to start as this seems less likelyi that it is SBO.  Fluids, antinausea meds, labs.  Had d/w wife - states that he has had trouble with confusion with narcotics and benzo's in past when he was on TPN - he is not confused now but she doesn't want him to have opiates ath ome - he has zofran and tramadol - has not had any up to this point at home.  Has improved with fluids, meds as above xary without obvious obstruction or free air - no sig change from prior Pain is gone and nausea is better - no v/d since arrival Stable for d/c - pt and spouse in agreement for home trial on liquids.  Final Clinical Impressions(s) / ED Diagnoses   Final diagnoses:  Left upper quadrant pain  Nausea vomiting and diarrhea    New Prescriptions New Prescriptions   PROMETHAZINE (PHENERGAN) 12.5 MG TABLET    Take 1 tablet (12.5 mg total) by mouth every 6 (six) hours as needed for nausea or vomiting.     Noemi Chapel, MD 08/21/16 2238

## 2016-08-21 NOTE — ED Triage Notes (Signed)
Pt reports N/V/D today basically "all day"  He is followed by Dr [name]

## 2016-08-21 NOTE — Discharge Instructions (Signed)
Phenergan if nausea is no better with zofran Drink plenty of clear liquids for next 24 hours, then slowly advance diet. See your doctor in 2 days for recheck ER if your symptoms worsen.

## 2016-09-15 ENCOUNTER — Encounter (HOSPITAL_COMMUNITY): Payer: Self-pay | Admitting: Emergency Medicine

## 2016-09-15 ENCOUNTER — Inpatient Hospital Stay (HOSPITAL_COMMUNITY)
Admission: EM | Admit: 2016-09-15 | Discharge: 2016-09-19 | DRG: 389 | Disposition: A | Discharge status: Home / Self Care | Payer: Medicare Other | Attending: Internal Medicine | Admitting: Internal Medicine

## 2016-09-15 ENCOUNTER — Encounter (HOSPITAL_COMMUNITY): Payer: Medicare Other

## 2016-09-15 ENCOUNTER — Emergency Department (HOSPITAL_COMMUNITY): Payer: Medicare Other

## 2016-09-15 DIAGNOSIS — N183 Chronic kidney disease, stage 3 (moderate): Secondary | ICD-10-CM | POA: Diagnosis present

## 2016-09-15 DIAGNOSIS — I10 Essential (primary) hypertension: Secondary | ICD-10-CM | POA: Diagnosis present

## 2016-09-15 DIAGNOSIS — I130 Hypertensive heart and chronic kidney disease with heart failure and stage 1 through stage 4 chronic kidney disease, or unspecified chronic kidney disease: Secondary | ICD-10-CM | POA: Diagnosis present

## 2016-09-15 DIAGNOSIS — I5032 Chronic diastolic (congestive) heart failure: Secondary | ICD-10-CM | POA: Diagnosis present

## 2016-09-15 DIAGNOSIS — K5669 Other partial intestinal obstruction: Principal | ICD-10-CM | POA: Diagnosis present

## 2016-09-15 DIAGNOSIS — Z903 Acquired absence of stomach [part of]: Secondary | ICD-10-CM

## 2016-09-15 DIAGNOSIS — Z7901 Long term (current) use of anticoagulants: Secondary | ICD-10-CM

## 2016-09-15 DIAGNOSIS — M810 Age-related osteoporosis without current pathological fracture: Secondary | ICD-10-CM | POA: Diagnosis present

## 2016-09-15 DIAGNOSIS — D631 Anemia in chronic kidney disease: Secondary | ICD-10-CM | POA: Diagnosis present

## 2016-09-15 DIAGNOSIS — Z9049 Acquired absence of other specified parts of digestive tract: Secondary | ICD-10-CM

## 2016-09-15 DIAGNOSIS — Z86711 Personal history of pulmonary embolism: Secondary | ICD-10-CM | POA: Diagnosis present

## 2016-09-15 DIAGNOSIS — R109 Unspecified abdominal pain: Secondary | ICD-10-CM | POA: Diagnosis not present

## 2016-09-15 DIAGNOSIS — K567 Ileus, unspecified: Secondary | ICD-10-CM | POA: Diagnosis present

## 2016-09-15 DIAGNOSIS — R110 Nausea: Secondary | ICD-10-CM | POA: Diagnosis present

## 2016-09-15 DIAGNOSIS — R1032 Left lower quadrant pain: Secondary | ICD-10-CM

## 2016-09-15 DIAGNOSIS — G40909 Epilepsy, unspecified, not intractable, without status epilepticus: Secondary | ICD-10-CM

## 2016-09-15 DIAGNOSIS — Z7983 Long term (current) use of bisphosphonates: Secondary | ICD-10-CM

## 2016-09-15 DIAGNOSIS — Z85038 Personal history of other malignant neoplasm of large intestine: Secondary | ICD-10-CM

## 2016-09-15 DIAGNOSIS — K56600 Partial intestinal obstruction, unspecified as to cause: Secondary | ICD-10-CM | POA: Diagnosis present

## 2016-09-15 DIAGNOSIS — E785 Hyperlipidemia, unspecified: Secondary | ICD-10-CM | POA: Diagnosis present

## 2016-09-15 DIAGNOSIS — Z87891 Personal history of nicotine dependence: Secondary | ICD-10-CM

## 2016-09-15 DIAGNOSIS — D509 Iron deficiency anemia, unspecified: Secondary | ICD-10-CM | POA: Diagnosis present

## 2016-09-15 DIAGNOSIS — Z23 Encounter for immunization: Secondary | ICD-10-CM

## 2016-09-15 DIAGNOSIS — E538 Deficiency of other specified B group vitamins: Secondary | ICD-10-CM | POA: Diagnosis present

## 2016-09-15 DIAGNOSIS — K219 Gastro-esophageal reflux disease without esophagitis: Secondary | ICD-10-CM | POA: Diagnosis present

## 2016-09-15 DIAGNOSIS — Z79899 Other long term (current) drug therapy: Secondary | ICD-10-CM

## 2016-09-15 DIAGNOSIS — R1012 Left upper quadrant pain: Secondary | ICD-10-CM | POA: Diagnosis present

## 2016-09-15 LAB — CBC WITH DIFFERENTIAL/PLATELET
BASOS ABS: 0 10*3/uL (ref 0.0–0.1)
Basophils Relative: 0 %
Eosinophils Absolute: 0.1 10*3/uL (ref 0.0–0.7)
Eosinophils Relative: 2 %
HEMATOCRIT: 42 % (ref 39.0–52.0)
Hemoglobin: 14.4 g/dL (ref 13.0–17.0)
LYMPHS PCT: 25 %
Lymphs Abs: 1.9 10*3/uL (ref 0.7–4.0)
MCH: 31 pg (ref 26.0–34.0)
MCHC: 34.3 g/dL (ref 30.0–36.0)
MCV: 90.5 fL (ref 78.0–100.0)
MONO ABS: 0.8 10*3/uL (ref 0.1–1.0)
Monocytes Relative: 10 %
NEUTROS ABS: 4.7 10*3/uL (ref 1.7–7.7)
Neutrophils Relative %: 63 %
Platelets: 175 10*3/uL (ref 150–400)
RBC: 4.64 MIL/uL (ref 4.22–5.81)
RDW: 14 % (ref 11.5–15.5)
WBC: 7.5 10*3/uL (ref 4.0–10.5)

## 2016-09-15 LAB — URINALYSIS, ROUTINE W REFLEX MICROSCOPIC
Bilirubin Urine: NEGATIVE
GLUCOSE, UA: NEGATIVE mg/dL
Hgb urine dipstick: NEGATIVE
Ketones, ur: NEGATIVE mg/dL
LEUKOCYTES UA: NEGATIVE
Nitrite: NEGATIVE
PROTEIN: NEGATIVE mg/dL
SPECIFIC GRAVITY, URINE: 1.017 (ref 1.005–1.030)
pH: 5 (ref 5.0–8.0)

## 2016-09-15 LAB — COMPREHENSIVE METABOLIC PANEL
ALBUMIN: 4 g/dL (ref 3.5–5.0)
ALT: 17 U/L (ref 17–63)
ANION GAP: 11 (ref 5–15)
AST: 21 U/L (ref 15–41)
Alkaline Phosphatase: 74 U/L (ref 38–126)
BILIRUBIN TOTAL: 0.5 mg/dL (ref 0.3–1.2)
BUN: 8 mg/dL (ref 6–20)
CHLORIDE: 102 mmol/L (ref 101–111)
CO2: 25 mmol/L (ref 22–32)
Calcium: 9 mg/dL (ref 8.9–10.3)
Creatinine, Ser: 1.52 mg/dL — ABNORMAL HIGH (ref 0.61–1.24)
GFR calc Af Amer: 54 mL/min — ABNORMAL LOW (ref >60)
GFR calc non Af Amer: 46 mL/min — ABNORMAL LOW (ref >60)
GLUCOSE: 104 mg/dL — AB (ref 65–99)
POTASSIUM: 4.2 mmol/L (ref 3.5–5.1)
SODIUM: 138 mmol/L (ref 135–145)
Total Protein: 6.8 g/dL (ref 6.5–8.1)

## 2016-09-15 LAB — LIPASE, BLOOD: LIPASE: 22 U/L (ref 11–51)

## 2016-09-15 MED ORDER — METOPROLOL TARTRATE 5 MG/5ML IV SOLN
2.5000 mg | Freq: Four times a day (QID) | INTRAVENOUS | Status: DC
  Administered 2016-09-16 – 2016-09-17 (×8): 2.5 mg via INTRAVENOUS
  Filled 2016-09-15 (×8): qty 5

## 2016-09-15 MED ORDER — ONDANSETRON HCL 4 MG/2ML IJ SOLN
4.0000 mg | Freq: Once | INTRAMUSCULAR | Status: AC
  Administered 2016-09-15: 4 mg via INTRAVENOUS
  Filled 2016-09-15: qty 2

## 2016-09-15 MED ORDER — LEVETIRACETAM 500 MG/5ML IV SOLN
INTRAVENOUS | Status: AC
  Filled 2016-09-15: qty 10

## 2016-09-15 MED ORDER — HYDROMORPHONE HCL 1 MG/ML IJ SOLN
1.0000 mg | Freq: Once | INTRAMUSCULAR | Status: AC
  Administered 2016-09-15: 1 mg via INTRAVENOUS
  Filled 2016-09-15: qty 1

## 2016-09-15 MED ORDER — ACETAMINOPHEN 650 MG RE SUPP: 650.0000 mg | Freq: Four times a day (QID) | RECTAL | Status: DC | PRN

## 2016-09-15 MED ORDER — SODIUM CHLORIDE 0.9 % IV SOLN
750.0000 mg | Freq: Two times a day (BID) | INTRAVENOUS | Status: DC
  Administered 2016-09-15 – 2016-09-17 (×4): 750 mg via INTRAVENOUS
  Filled 2016-09-15 (×10): qty 7.5

## 2016-09-15 MED ORDER — PANTOPRAZOLE SODIUM 40 MG IV SOLR
40.0000 mg | INTRAVENOUS | Status: DC
  Administered 2016-09-15 – 2016-09-18 (×4): 40 mg via INTRAVENOUS
  Filled 2016-09-15 (×4): qty 40

## 2016-09-15 MED ORDER — PNEUMOCOCCAL VAC POLYVALENT 25 MCG/0.5ML IJ INJ
0.5000 mL | INJECTION | INTRAMUSCULAR | Status: AC
  Administered 2016-09-16: 0.5 mL via INTRAMUSCULAR
  Filled 2016-09-15: qty 0.5

## 2016-09-15 MED ORDER — ACETAMINOPHEN 325 MG PO TABS: 650.0000 mg | ORAL_TABLET | Freq: Four times a day (QID) | ORAL | Status: DC | PRN

## 2016-09-15 MED ORDER — ENOXAPARIN SODIUM 60 MG/0.6ML ~~LOC~~ SOLN
60.0000 mg | SUBCUTANEOUS | Status: DC
  Administered 2016-09-15 – 2016-09-18 (×4): 60 mg via SUBCUTANEOUS
  Filled 2016-09-15 (×4): qty 0.6

## 2016-09-15 MED ORDER — HYDROMORPHONE HCL 1 MG/ML IJ SOLN
1.0000 mg | INTRAMUSCULAR | Status: DC | PRN
  Administered 2016-09-15 – 2016-09-18 (×16): 1 mg via INTRAVENOUS
  Filled 2016-09-15 (×16): qty 1

## 2016-09-15 MED ORDER — SODIUM CHLORIDE 0.9 % IV BOLUS (SEPSIS)
1000.0000 mL | Freq: Once | INTRAVENOUS | Status: AC
  Administered 2016-09-15: 1000 mL via INTRAVENOUS

## 2016-09-15 MED ORDER — ONDANSETRON HCL 4 MG/2ML IJ SOLN
4.0000 mg | Freq: Four times a day (QID) | INTRAMUSCULAR | Status: DC | PRN
  Administered 2016-09-16 – 2016-09-17 (×5): 4 mg via INTRAVENOUS
  Filled 2016-09-15 (×5): qty 2

## 2016-09-15 MED ORDER — SODIUM CHLORIDE 0.9 % IV SOLN
Freq: Once | INTRAVENOUS | Status: AC
  Administered 2016-09-15: 1000 mL via INTRAVENOUS

## 2016-09-15 NOTE — ED Triage Notes (Signed)
Pt c/o left side abd pain since yesterday with nausea. Denies v/d. Here 3 weeks ago with same and dx with gas. Pt states abd is "distended". abd does not appear to be distended but somewhat firm and pt very tender to palpation

## 2016-09-15 NOTE — H&P (Signed)
TRH H&P    Patient Demographics:    [name], is a 66 y.o. male  MRN: [id number]  DOB - Oct 04, 1950  Admit Date - 09/15/2016  Referring MD/NP/PA: Dr. Oleta Mouse  Outpatient Primary MD for the patient is [name], MD  Patient coming from: Home  Chief Complaint  Patient presents with  . Abdominal Pain  . Nausea      HPI:    [name]  is a 66 y.o. 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 Patient says that he was having nausea but denies vomiting. Denies hematemesis or hematochezia. No fever or chills. The pain describes is in  left lower quadrant, 7/10 in intensity. Patient has not had bowel movement. Unable to pass gas. He denies chest pain, complains of shortness of breath. Does have history of congestive heart failure.  In the ED abdominal x-ray was done which showed findings of ileus no clear SBO. CT scan was avoided after ED physician had discussion with  Patient as he has had multiple CTs done over the past few months for the same problem. Review of CT from March 2018 shows no diverticulosis or duodenitis. Colonoscopy from 2017 also shows no diverticulosis.   Review of systems:      All other symptoms reviewed and are negative.   With Past History of the following :    Past Medical History:  Diagnosis Date  . 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 Anemia   . Anxiety   . Arthritis   . Barrett's esophagus   . Blood transfusion   . Bowel obstruction ([place]) 05/13/2012   Recurrent  . Bronchitis   . Chest pain at rest     . Chronic abdominal pain   . Erosive esophagitis   . ETOH abuse    quit 03/2010  . GERD (gastroesophageal reflux disease)   . Hx of Clostridium difficile infection 01/2012  . Hypertension   . Ileus ([place])   . Iron deficiency anemia 03/23/2016  . Obstruction of bowel ([place]) 03/03/14  . Osteoporosis   . Personal history of PE (pulmonary embolism) 10/01/2010  . Pneumonia   . Pulmonary embolism ([place]) 02/2010  . Recurrent upper respiratory infection (URI)   . S/P endoscopy September 28, 2010   erosive reflux esophagitis, Billroth I anatomy  . 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 Seizures ([place])   . Shortness of breath   . Sleep apnea   . TIA (transient ischemic attack) 10/11  . Vitamin B12 deficiency       Past Surgical History:  Procedure Laterality Date  . ABDOMINAL ADHESION SURGERY  03/04/15   @ [facility]  . ABDOMINAL EXPLORATION SURGERY    . abdominal sugery     for  bowel obstruction x 8, all in 1980s, except for one in 07/2010  . APPENDECTOMY  1980s  . Billroth 1 hemigastrectomy  1980s   per patient for benign duodenal tumor  . CARDIAC CATHETERIZATION  07/17/2012  . CHOLECYSTECTOMY  1980s  . COLON SURGERY  May 2012   left hemicolectomy, colon cancer found at time of surgery for bowel obstruction  . COLONOSCOPY  03/18/2011   anastomosis at 35cm. Several adenomatous polyps removed. Sigmoid diverticulosis. Next TCS 02/2013  . COLONOSCOPY N/A 07/24/2012   WUJ:[id number] post segmental resection with normal-appearing colonic anastomosis aside from an adjacent polyp-removed as described above. Rectal polyp-removed as described above. CT findings appear to have been artifactual. tubular adenomas/prolapsed type polyp.  . COLONOSCOPY N/A 05/15/2015   Procedure: COLONOSCOPY;  Surgeon: [name], MD;  [facility];  Service: Endoscopy;  Laterality: N/A;  . ESOPHAGOGASTRODUODENOSCOPY  09/28/2010  . ESOPHAGOGASTRODUODENOSCOPY  12/01/2010   Cervical web status post dilation, erosive esophagitis, B1  hemigastrectomy, inflamed anastomosis  . ESOPHAGOGASTRODUODENOSCOPY  04/16/2011   excoriation at GEJ c/w trauma/M-W tear, friable gastric anastomosis, dilation efferent limb  . ESOPHAGOGASTRODUODENOSCOPY N/A 06/03/2014   Dr.[name]- cervcal esopphageal web s/p dilation. abnormal distal esophagus bx= barretts esophagus  . ESOPHAGOGASTRODUODENOSCOPY N/A 05/15/2015   Procedure: ESOPHAGOGASTRODUODENOSCOPY (EGD);  Surgeon: [name], MD;  [facility];  Service: Endoscopy;  Laterality: N/A;  230  . ESOPHAGOGASTRODUODENOSCOPY (EGD) WITH ESOPHAGEAL DILATION  02/25/2012   NWG:[id number] esophageal web-s/p dilation anddisruption as described above. Status post prior gastric with Billroth I configuration. Abnormal gastric mucosa at the anastomosis. Gastric biopsy showed mild chronic inflammation but no H. pylori   . HERNIA REPAIR     right inguinal  . MALONEY DILATION N/A 06/03/2014   Procedure: [name] DILATION;  Surgeon: [name], MD;  [facility];  Service: Endoscopy;  Laterality: N/A;  [name] DILATION N/A 05/15/2015   Procedure: [name] DILATION;  Surgeon: [name], MD;  [facility];  Service: Endoscopy;  Laterality: N/A;  . PORTACATH PLACEMENT    . SAVORY DILATION N/A 06/03/2014   Procedure: SAVORY DILATION;  Surgeon: [name], MD;  [facility];  Service: Endoscopy;  Laterality: N/A;      Social History:      Social History  Substance Use Topics  . Smoking status: Former Smoker    Packs/day: 0.50    Years: 40.00    Types: Cigarettes    Quit date: 12/20/2012  . Smokeless tobacco: Never Used  . Alcohol use No       Family History :     Family History  Problem Relation Age of Onset  . Hypertension Mother   . Arthritis Mother   . Pneumonia Mother   . Hypertension Father   . Heart attack Father   . Colon cancer Neg Hx       Home Medications:   Prior to Admission medications   Medication Sig Start Date End Date  Taking? Authorizing Provider  acetaminophen (TYLENOL) 500 MG tablet Take 500 mg by mouth every 6 (six) hours as needed.   Yes [provider]  acidophilus (RISAQUAD) CAPS capsule Take 1 capsule by mouth daily.   Yes [provider]  albuterol (PROAIR HFA) 108 (90 BASE) MCG/ACT inhaler Inhale 1-2 puffs into the lungs every 6 (six) hours as needed for wheezing or shortness of breath.    Yes [provider]  atorvastatin (LIPITOR) 20 MG tablet Take 20  mg by mouth at bedtime.    Yes [provider]  carvedilol (COREG) 6.25 MG tablet Take 6.25 mg by mouth 2 (two) times daily with a meal.   Yes [provider]  cholecalciferol (VITAMIN D) 1000 UNITS tablet Take 1,000 Units by mouth daily.    Yes [provider]  Copper Gluconate 2 MG CAPS Take 1 capsule by mouth daily. 05/03/16  Yes Kefalas, Manon Hilding, PA-C  cyanocobalamin (,VITAMIN B-12,) 1000 MCG/ML injection Inject 1,000 mcg into the muscle every 30 (thirty) days.   Yes [provider]  docusate sodium (COLACE) 100 MG capsule Take 100 mg by mouth daily as needed.    Yes [provider]  enoxaparin (LOVENOX) 60 MG/0.6ML injection Inject 60 mg into the skin at bedtime.    Yes [provider]  ferrous sulfate 325 (65 FE) MG tablet Take 1 tablet (325 mg total) by mouth 2 (two) times daily with a meal. 03/21/16  Yes Thurnell Lose, MD  lansoprazole (PREVACID) 30 MG capsule Take 1 capsule (30 mg total) by mouth daily at 12 noon. 08/11/16  Yes Carlis Stable, NP  levETIRAcetam (KEPPRA) 750 MG tablet Take 750 mg by mouth 2 (two) times daily.   Yes [provider]  lidocaine-prilocaine (EMLA) cream Apply 1 application topically as needed (prior to accessing port).   Yes [provider]  LORazepam (ATIVAN) 1 MG tablet Take 1 tablet (1 mg total) by mouth every 8 (eight) hours as needed for anxiety. 05/23/16  Yes Elgergawy, Silver Huguenin, MD  magnesium oxide (MAGNESIUM-OXIDE)  400 (241.3 Mg) MG tablet Take 400 mg by mouth 2 (two) times daily.   Yes [provider]  Multiple Vitamin (MULTIVITAMIN WITH MINERALS) TABS tablet Take 1 tablet by mouth daily.   Yes [provider]  Nutritional Supplements (ENSURE PLUS HN) LIQD Take 1 Bottle by mouth every other day.   Yes [provider]  polyethylene glycol (MIRALAX / GLYCOLAX) packet Take 17 g by mouth daily as needed for mild constipation or moderate constipation.   Yes [provider]  prochlorperazine (COMPAZINE) 10 MG tablet Take 10 mg by mouth every 6 (six) hours as needed for nausea or vomiting.    Yes [provider]  promethazine (PHENERGAN) 12.5 MG tablet Take 1 tablet (12.5 mg total) by mouth every 6 (six) hours as needed for nausea or vomiting. 08/21/16  Yes Noemi Chapel, MD  sucralfate (CARAFATE) 1 GM/10ML suspension Take 10 mLs (1 g total) by mouth 4 (four) times daily. 08/11/16  Yes Carlis Stable, NP  tamsulosin (FLOMAX) 0.4 MG CAPS capsule Take 0.4 mg by mouth at bedtime.    Yes [provider]  Vitamin D, Ergocalciferol, (DRISDOL) 50000 units CAPS capsule Take 50,000 Units by mouth every Sunday.   Yes [provider]  zoledronic acid (RECLAST) 5 MG/100ML SOLN injection Inject 5 mg into the vein See admin instructions. Pt gets a dose yearly.   Yes [provider]     Allergies:    No Known Allergies   Physical Exam:   Vitals  Blood pressure (!) 170/92, pulse 62, temperature 97.6 F (36.4 C), temperature source Oral, resp. rate 18, height 5\' 9"  (1.753 m), weight 72.1 kg (159 lb), SpO2 94 %.  1.  General: Appears in no acute distress  2. Psychiatric:  Intact judgement and  insight, awake alert, oriented x 3.  3. Neurologic: No focal neurological deficits, all cranial nerves intact.Strength 5/5 all 4 extremities,  sensation intact all 4 extremities, plantars down going.  4. Eyes :  anicteric sclerae, moist conjunctivae with no lid  lag. PERRLA.  5. ENMT:  Oropharynx clear with moist mucous membranes and good dentition  6. Neck:  supple, no cervical lymphadenopathy appriciated, No thyromegaly  7. Respiratory : Normal respiratory effort, good air movement bilaterally,clear to  auscultation bilaterally  8. Cardiovascular : RRR, no gallops, rubs or murmurs, no leg edema  9. Gastrointestinal:  Positive bowel sounds, abdomen soft, mild generalized tenderness to palpation,no hepatosplenomegaly, no rigidity, positive guarding    10. Skin:  No cyanosis, normal texture and turgor, no rash, lesions or ulcers  11.Musculoskeletal:  Good muscle tone,  joints appear normal , no effusions,  normal range of motion    Data Review:    CBC  Recent Labs Lab 09/15/16 1302  WBC 7.5  HGB 14.4  HCT 42.0  PLT 175  MCV 90.5  MCH 31.0  MCHC 34.3  RDW 14.0  LYMPHSABS 1.9  MONOABS 0.8  EOSABS 0.1  BASOSABS 0.0   ------------------------------------------------------------------------------------------------------------------  Chemistries   Recent Labs Lab 09/15/16 1302  NA 138  K 4.2  CL 102  CO2 25  GLUCOSE 104*  BUN 8  CREATININE 1.52*  CALCIUM 9.0  AST 21  ALT 17  ALKPHOS 74  BILITOT 0.5   ------------------------------------------------------------------------------------------------------------------  ------------------------------------------------------------------------------------------------------------------ GFR: Estimated Creatinine Clearance: 48.5 mL/min (A) (by C-G formula based on SCr of 1.52 mg/dL (H)). Liver Function Tests:  Recent Labs Lab 09/15/16 1302  AST 21  ALT 17  ALKPHOS 74  BILITOT 0.5  PROT 6.8  ALBUMIN 4.0    Recent Labs Lab 09/15/16 1302  LIPASE 22   No results for input(s): AMMONIA in the last 168 hours. Coagulation Profile: No results for input(s): INR, PROTIME in the last 168 hours. Cardiac Enzymes: No results for input(s): CKTOTAL, CKMB, CKMBINDEX,  TROPONINI in the last 168 hours. BNP (last 3 results) No results for input(s): PROBNP in the last 8760 hours. HbA1C: No results for input(s): HGBA1C in the last 72 hours. CBG: No results for input(s): GLUCAP in the last 168 hours. Lipid Profile: No results for input(s): CHOL, HDL, LDLCALC, TRIG, CHOLHDL, LDLDIRECT in the last 72 hours. Thyroid Function Tests: No results for input(s): TSH, T4TOTAL, FREET4, T3FREE, THYROIDAB in the last 72 hours. Anemia Panel: No results for input(s): VITAMINB12, FOLATE, FERRITIN, TIBC, IRON, RETICCTPCT in the last 72 hours.  --------------------------------------------------------------------------------------------------------------- Urine analysis:    Component Value Date/Time   COLORURINE YELLOW 09/15/2016 1700   APPEARANCEUR CLEAR 09/15/2016 1700   LABSPEC 1.017 09/15/2016 1700   PHURINE 5.0 09/15/2016 1700   GLUCOSEU NEGATIVE 09/15/2016 1700   HGBUR NEGATIVE 09/15/2016 1700   BILIRUBINUR NEGATIVE 09/15/2016 1700   KETONESUR NEGATIVE 09/15/2016 1700   PROTEINUR NEGATIVE 09/15/2016 1700   UROBILINOGEN 0.2 11/30/2014 1115   NITRITE NEGATIVE 09/15/2016 1700   LEUKOCYTESUR NEGATIVE 09/15/2016 1700      Imaging Results:    Dg Abdomen Acute W/chest  Result Date: 09/15/2016 CLINICAL DATA:  Left lower quadrant abdominal pain with nausea EXAM: DG ABDOMEN ACUTE W/ 1V CHEST COMPARISON:  08/21/2016 FINDINGS: Single-view chest demonstrates left-sided central venous port tip to overlie the SVC. Linear atelectasis or scarring at the bases. No acute consolidation or effusion. Stable cardiomediastinal silhouette. No pneumothorax. Supine and upright views of the abdomen demonstrate no free air beneath the diaphragm. Postsurgical changes in the upper abdomen and left mid abdomen. Similar appearance of gas-filled mostly large bowel in the central  abdomen and pelvis. IMPRESSION: 1. No radiographic evidence for acute cardiopulmonary abnormality 2. Mild gaseous  dilatation of mostly large bowel with decreased small bowel distention since the previous examination. No free air. Electronically Signed   By: Donavan Foil M.D.   On: 09/15/2016 18:23       Assessment & Plan:    Active Problems:   Personal history of PE (pulmonary embolism)   Left sided abdominal pain   Partial small bowel obstruction ([facility])   Nausea   Ileus ([place])   1. Abdominal pain-chronic, patient has seen multiple providers over last few months for similar complaints. Previous CT scan in March showed no significant bowel abnormality. There is no concern for infectious source at this time. If pain doesn't improve in next 12-24 hours consider CT scan if clinically indicated. 2. Ileus versus partial small bowel obstruction- this also seems to be a recurrent problem, patient was noted in March with similar complaints. Will keep him nothing by mouth. Continue IV fluids with normal saline. Follow BMP in a.m. 3. History of pulmonary embolism-patient is on Lovenox 60 mg subcutaneous daily at bedtime. Will continue with Lovenox in the hospital. 4. History of seizures-patient is on Keppra 750 mg twice a day, will continue with Keppra 750 mg IV every 12 hours. 5. Hypertension-patient takes Coreg as outpatient, will start metoprolol 2.5 mg IV every 6 hours 6. History of colon cancer status post subtotal colectomy-stable, followed by oncology as outpatient. 7. Chronic anemia-hemoglobin is stable at 14.4.   DVT Prophylaxis-   Lovenox   AM Labs Ordered, also please review Full Orders  Family Communication: Admission, patients condition and plan of care including tests being ordered have been discussed with the patient  who indicate understanding and agree with the plan and Code Status.  Code Status: Full code  Admission status: Observation    Time spent in minutes : 60 minutes   Tullio Chausse S M.D on 09/15/2016 at 8:31 PM  Between 7am to 7pm - Pager - [phone]. After 7pm go to  [email or web address] - password [facility]  [facility] - Office  [phone]

## 2016-09-15 NOTE — ED Provider Notes (Signed)
[facility] DEPT Provider Note   CSN: [id number] Arrival date & time: 09/15/16  1230     History   Chief Complaint Chief Complaint  Patient presents with  . Abdominal Pain  . Nausea    HPI [name] is a 66 y.o. male.  HPI 66 year old male who presents with abdominal pain and nausea. He has a history of colon cancer and stomach mass status post Billroth I with partial gastrectomy, appendectomy, cholecystectomy, and hernia repair. I has history of recurrent small bowel obstruction with exploratory laparoscopy in the past as well. Reports onset of left lower quadrant abdominal pain this morning. Noticed abdominal distention last night has progressed into this morning as well. Last bowel movement was 2 days ago, does not think he has passed any gas all day. Had nausea but no vomiting. Feels similar to previous bowel obstructions. No fevers or chills, dysuria or urinary frequency, chest pain or difficulty breathing. Past Medical History:  Diagnosis Date  . 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 Anemia   . Anxiety   . Arthritis   . Barrett's esophagus   . Blood transfusion   . Bowel obstruction ([place]) 05/13/2012   Recurrent  . Bronchitis   . Chest pain at rest   . Chronic abdominal pain   . Erosive esophagitis   . ETOH abuse    quit 03/2010  . GERD (gastroesophageal reflux disease)   . Hx of Clostridium difficile infection 01/2012  . Hypertension   . Ileus ([place])   . Iron deficiency anemia 03/23/2016  . Obstruction of bowel ([place]) 03/03/14  . Osteoporosis   . Personal history of PE (pulmonary embolism) 10/01/2010  . Pneumonia   . Pulmonary embolism ([place]) 02/2010  . Recurrent upper respiratory infection (URI)   . S/P endoscopy September 28, 2010   erosive reflux esophagitis, Billroth I anatomy  . 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 Seizures ([place])   . Shortness of breath   . Sleep apnea   . TIA (transient ischemic attack) 10/11  . Vitamin B12  deficiency     Patient Active Problem List   Diagnosis Date Noted  . CKD (chronic kidney disease) stage 3, GFR 30-59 ml/min 06/14/2016  . Patient has nasogastric tube   . Ileus ([place]) 05/15/2016  . Chronic diastolic heart failure ([place]) 05/15/2016  . Iron deficiency anemia 03/23/2016  . Acute renal failure ([place]) 03/19/2016  . Normocytic anemia 03/19/2016  . Hypotension 03/19/2016  . Anemia   . History of colon cancer, stage III   . Hx of colon cancer, stage III   . History of colonic polyps   . Barrett's esophagus without dysplasia   . Dysphagia   . Essential hypertension 11/30/2014  . Dysphagia, pharyngoesophageal phase   . Mucosal abnormality of esophagus   . SBO (small bowel obstruction) ([place]) 09/17/2013  . Rectal bleeding 07/11/2012  . Bowel obstruction ([place]) 07/11/2012  . Abnormal CT scan, colon 07/11/2012  . Esophageal dysphagia 02/22/2012  . H/O Clostridium difficile infection 02/22/2012  . Diarrhea 09/10/2011  . Cellulitis 09/10/2011  . Nausea 06/07/2011  . Abdominal distention 06/07/2011  . HTN (hypertension) 06/07/2011  . Partial small bowel obstruction ([place]) 05/31/2011  . GI bleed 05/27/2011  . Coagulopathy ([place]) 05/27/2011  . Gastroenteritis 05/27/2011  . Small bowel obstruction ([place]) 05/13/2011  . Left sided abdominal pain 05/13/2011  . Pulmonary embolism ([place]) 01/17/2011  . Chest pain at rest 01/17/2011  . Pneumonia 12/11/2010  .  Hematemesis 12/05/2010  . Chronic abdominal pain 12/05/2010  . Pancytopenia due to antineoplastic chemotherapy ([place]) 12/05/2010  . Coffee ground emesis 11/29/2010  . Esophageal reflux disease 11/15/2010  . 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 Personal history of PE (pulmonary embolism) 10/01/2010  . Bronchitis, acute 10/01/2010  . Erosive esophagitis 10/01/2010  . Fever chills 09/29/2010  . Bleeding gastrointestinal 09/26/2010  . Supratherapeutic INR 09/26/2010  . Abdominal pain 09/26/2010  . Nausea and vomiting 09/26/2010    . Anemia, chronic disease 09/26/2010  . Adenocarcinoma of colon with mucinous features 09/22/2010    Past Surgical History:  Procedure Laterality Date  . ABDOMINAL ADHESION SURGERY  03/04/15   @ [facility]  . ABDOMINAL EXPLORATION SURGERY    . abdominal sugery     for bowel obstruction x 8, all in 1980s, except for one in 07/2010  . APPENDECTOMY  1980s  . Billroth 1 hemigastrectomy  1980s   per patient for benign duodenal tumor  . CARDIAC CATHETERIZATION  07/17/2012  . CHOLECYSTECTOMY  1980s  . COLON SURGERY  May 2012   left hemicolectomy, colon cancer found at time of surgery for bowel obstruction  . COLONOSCOPY  03/18/2011   anastomosis at 35cm. Several adenomatous polyps removed. Sigmoid diverticulosis. Next TCS 02/2013  . COLONOSCOPY N/A 07/24/2012   NLG:XQJJHE post segmental resection with normal-appearing colonic anastomosis aside from an adjacent polyp-removed as described above. Rectal polyp-removed as described above. CT findings appear to have been artifactual. tubular adenomas/prolapsed type polyp.  . COLONOSCOPY N/A 05/15/2015   Procedure: COLONOSCOPY;  Surgeon: [name], MD;  Location: [facility] SUITE;  Service: Endoscopy;  Laterality: N/A;  . ESOPHAGOGASTRODUODENOSCOPY  09/28/2010  . ESOPHAGOGASTRODUODENOSCOPY  12/01/2010   Cervical web status post dilation, erosive esophagitis, B1 hemigastrectomy, inflamed anastomosis  . ESOPHAGOGASTRODUODENOSCOPY  04/16/2011   excoriation at GEJ c/w trauma/M-W tear, friable gastric anastomosis, dilation efferent limb  . ESOPHAGOGASTRODUODENOSCOPY N/A 06/03/2014   Dr.[name]- cervcal esopphageal web s/p dilation. abnormal distal esophagus bx= barretts esophagus  . ESOPHAGOGASTRODUODENOSCOPY N/A 05/15/2015   Procedure: ESOPHAGOGASTRODUODENOSCOPY (EGD);  Surgeon: [name], MD;  Location: [facility];  Service: Endoscopy;  Laterality: N/A;  230  . ESOPHAGOGASTRODUODENOSCOPY (EGD) WITH ESOPHAGEAL DILATION  02/25/2012   RDE:YCXKGYJE esophageal  web-s/p dilation anddisruption as described above. Status post prior gastric with Billroth I configuration. Abnormal gastric mucosa at the anastomosis. Gastric biopsy showed mild chronic inflammation but no H. pylori   . HERNIA REPAIR     right inguinal  . MALONEY DILATION N/A 06/03/2014   Procedure: [name] DILATION;  Surgeon: [name], MD;  Location: [facility];  Service: Endoscopy;  Laterality: N/A;  [name] DILATION N/A 05/15/2015   Procedure: [name] DILATION;  Surgeon: [name], MD;  Location: [facility];  Service: Endoscopy;  Laterality: N/A;  . PORTACATH PLACEMENT    . SAVORY DILATION N/A 06/03/2014   Procedure: SAVORY DILATION;  Surgeon: [name], MD;  Location: [facility];  Service: Endoscopy;  Laterality: N/A;       Home Medications    Prior to Admission medications   Medication Sig Start Date End Date Taking? Authorizing Provider  acetaminophen (TYLENOL) 500 MG tablet Take 500 mg by mouth every 6 (six) hours as needed.   Yes [provider]  acidophilus (RISAQUAD) CAPS capsule Take 1 capsule by mouth daily.   Yes [provider]  albuterol (PROAIR HFA) 108 (90 BASE) MCG/ACT inhaler Inhale 1-2 puffs into the lungs every 6 (  six) hours as needed for wheezing or shortness of breath.    Yes [provider]  atorvastatin (LIPITOR) 20 MG tablet Take 20 mg by mouth at bedtime.    Yes [provider]  carvedilol (COREG) 6.25 MG tablet Take 6.25 mg by mouth 2 (two) times daily with a meal.   Yes [provider]  cholecalciferol (VITAMIN D) 1000 UNITS tablet Take 1,000 Units by mouth daily.    Yes [provider]  Copper Gluconate 2 MG CAPS Take 1 capsule by mouth daily. 05/03/16  Yes Kefalas, Manon Hilding, PA-C  cyanocobalamin (,VITAMIN B-12,) 1000 MCG/ML injection Inject 1,000 mcg into the muscle every 30 (thirty) days.   Yes [provider]  docusate sodium (COLACE) 100 MG capsule Take 100 mg by mouth  daily as needed.    Yes [provider]  enoxaparin (LOVENOX) 60 MG/0.6ML injection Inject 60 mg into the skin at bedtime.    Yes [provider]  ferrous sulfate 325 (65 FE) MG tablet Take 1 tablet (325 mg total) by mouth 2 (two) times daily with a meal. 03/21/16  Yes Thurnell Lose, MD  lansoprazole (PREVACID) 30 MG capsule Take 1 capsule (30 mg total) by mouth daily at 12 noon. 08/11/16  Yes Carlis Stable, NP  levETIRAcetam (KEPPRA) 750 MG tablet Take 750 mg by mouth 2 (two) times daily.   Yes [provider]  lidocaine-prilocaine (EMLA) cream Apply 1 application topically as needed (prior to accessing port).   Yes [provider]  LORazepam (ATIVAN) 1 MG tablet Take 1 tablet (1 mg total) by mouth every 8 (eight) hours as needed for anxiety. 05/23/16  Yes Elgergawy, Silver Huguenin, MD  magnesium oxide (MAGNESIUM-OXIDE) 400 (241.3 Mg) MG tablet Take 400 mg by mouth 2 (two) times daily.   Yes [provider]  Multiple Vitamin (MULTIVITAMIN WITH MINERALS) TABS tablet Take 1 tablet by mouth daily.   Yes [provider]  Nutritional Supplements (ENSURE PLUS HN) LIQD Take 1 Bottle by mouth every other day.   Yes [provider]  polyethylene glycol (MIRALAX / GLYCOLAX) packet Take 17 g by mouth daily as needed for mild constipation or moderate constipation.   Yes [provider]  prochlorperazine (COMPAZINE) 10 MG tablet Take 10 mg by mouth every 6 (six) hours as needed for nausea or vomiting.    Yes [provider]  promethazine (PHENERGAN) 12.5 MG tablet Take 1 tablet (12.5 mg total) by mouth every 6 (six) hours as needed for nausea or vomiting. 08/21/16  Yes Noemi Chapel, MD  sucralfate (CARAFATE) 1 GM/10ML suspension Take 10 mLs (1 g total) by mouth 4 (four) times daily. 08/11/16  Yes Carlis Stable, NP  tamsulosin (FLOMAX) 0.4 MG CAPS capsule Take 0.4 mg by mouth at bedtime.    Yes [provider]  Vitamin D,  Ergocalciferol, (DRISDOL) 50000 units CAPS capsule Take 50,000 Units by mouth every Sunday.   Yes [provider]  zoledronic acid (RECLAST) 5 MG/100ML SOLN injection Inject 5 mg into the vein See admin instructions. Pt gets a dose yearly.   Yes [provider]    Family History Family History  Problem Relation Age of Onset  . Hypertension Mother   . Arthritis Mother   . Pneumonia Mother   . Hypertension Father   . Heart attack Father   . Colon cancer Neg Hx     Social History Social History  Substance Use Topics  . Smoking status:  Former Smoker    Packs/day: 0.50    Years: 40.00    Types: Cigarettes    Quit date: 12/20/2012  . Smokeless tobacco: Never Used  . Alcohol use No     Allergies   Patient has no known allergies.   Review of Systems Review of Systems  Constitutional: Negative for fever.  Respiratory: Negative for shortness of breath.   Cardiovascular: Negative for chest pain.  Gastrointestinal: Positive for abdominal pain, constipation and nausea.  Genitourinary: Negative for dysuria.  All other systems reviewed and are negative.    Physical Exam Updated Vital Signs BP (!) 170/92   Pulse 62   Temp 97.6 F (36.4 C) (Oral)   Resp 18   Ht 5\' 9"  (1.753 m)   Wt 72.1 kg (159 lb)   SpO2 94%   BMI 23.48 kg/m   Physical Exam Physical Exam  Nursing note and vitals reviewed. Constitutional: Well developed, well nourished, non-toxic, and in no acute distress Head: Normocephalic and atraumatic.  Mouth/Throat: Oropharynx is clear and moist.  Neck: Normal range of motion. Neck supple.  Cardiovascular: Normal rate and regular rhythm.   Pulmonary/Chest: Effort normal and breath sounds normal.  Abdominal: Soft. Mild distension. There is tenderness in LLQ. Diminished bowel sounds throughout.  There is no rebound and no guarding.  Musculoskeletal: Normal range of motion.  Neurological: Alert, no facial droop, fluent speech, moves all  extremities symmetrically Skin: Skin is warm and dry.  Psychiatric: Cooperative   ED Treatments / Results  Labs (all labs ordered are listed, but only abnormal results are displayed) Labs Reviewed  COMPREHENSIVE METABOLIC PANEL - Abnormal; Notable for the following:       Result Value   Glucose, Bld 104 (*)    Creatinine, Ser 1.52 (*)    GFR calc non Af Amer 46 (*)    GFR calc Af Amer 54 (*)    All other components within normal limits  LIPASE, BLOOD  URINALYSIS, ROUTINE W REFLEX MICROSCOPIC  CBC WITH DIFFERENTIAL/PLATELET    EKG  EKG Interpretation None       Radiology Dg Abdomen Acute W/chest  Result Date: 09/15/2016 CLINICAL DATA:  Left lower quadrant abdominal pain with nausea EXAM: DG ABDOMEN ACUTE W/ 1V CHEST COMPARISON:  08/21/2016 FINDINGS: Single-view chest demonstrates left-sided central venous port tip to overlie the SVC. Linear atelectasis or scarring at the bases. No acute consolidation or effusion. Stable cardiomediastinal silhouette. No pneumothorax. Supine and upright views of the abdomen demonstrate no free air beneath the diaphragm. Postsurgical changes in the upper abdomen and left mid abdomen. Similar appearance of gas-filled mostly large bowel in the central abdomen and pelvis. IMPRESSION: 1. No radiographic evidence for acute cardiopulmonary abnormality 2. Mild gaseous dilatation of mostly large bowel with decreased small bowel distention since the previous examination. No free air. Electronically Signed   By: Donavan Foil M.D.   On: 09/15/2016 18:23    Procedures Procedures (including critical care time)  Medications Ordered in ED Medications  0.9 %  sodium chloride infusion (not administered)  sodium chloride 0.9 % bolus 1,000 mL (0 mLs Intravenous Stopped 09/15/16 1859)  ondansetron (ZOFRAN) injection 4 mg (4 mg Intravenous Given 09/15/16 1733)  HYDROmorphone (DILAUDID) injection 1 mg (1 mg Intravenous Given 09/15/16 1733)  ondansetron (ZOFRAN)  injection 4 mg (4 mg Intravenous Given 09/15/16 1955)  HYDROmorphone (DILAUDID) injection 1 mg (1 mg Intravenous Given 09/15/16 1954)     Initial Impression / Assessment and Plan / ED Course  I have reviewed the triage vital signs and the nursing notes.  Pertinent labs & imaging results that were available during my care of the patient were reviewed by me and considered in my medical decision making (see chart for details).     66 year old male with history of multiple abdominal surgeries and recurrent bowel obstructions who presents with abdominal pain, abdominal distention, constipation and nausea. He states that this is similar to when he has had prior bowel obstructions. He is nontoxic in no acute distress. Diffusely tender on abdominal exam, worse in the left lower quadrant. There is mild distention. Clinically there is concern for small bowel obstruction. X-ray of the abdomen shows mild gaseous dilation of large bowel greater than small bowel. Blood work is reassuring. History of subtotal colectomy with residual rectum, so unlikely to have diverticulitis.UA without blood or infection, likely kidney stone.  Discussed potential CT versus just treating for clinical suspicion of obstruction with serial abdominal exams given multiple abdominal CTs in the past. He elected to have bowel rest and treat symptomatically. Is still having ongoing pain, distention and nausea despite medications. Discussed with Dr. Darrick Meigs. To admit for medical management and serial abdominal exams.    Final Clinical Impressions(s) / ED Diagnoses   Final diagnoses:  Left lower quadrant pain    New Prescriptions New Prescriptions   No medications on file     Forde Dandy, MD 09/15/16 2019

## 2016-09-15 NOTE — ED Notes (Signed)
Pt states he normally has high BP. Will recheck with Manual BP

## 2016-09-15 NOTE — ED Notes (Signed)
Report attempted 

## 2016-09-16 DIAGNOSIS — G40909 Epilepsy, unspecified, not intractable, without status epilepticus: Secondary | ICD-10-CM

## 2016-09-16 DIAGNOSIS — Z79899 Other long term (current) drug therapy: Secondary | ICD-10-CM | POA: Diagnosis not present

## 2016-09-16 DIAGNOSIS — Z86711 Personal history of pulmonary embolism: Secondary | ICD-10-CM | POA: Diagnosis not present

## 2016-09-16 DIAGNOSIS — M810 Age-related osteoporosis without current pathological fracture: Secondary | ICD-10-CM | POA: Diagnosis present

## 2016-09-16 DIAGNOSIS — E785 Hyperlipidemia, unspecified: Secondary | ICD-10-CM | POA: Diagnosis present

## 2016-09-16 DIAGNOSIS — Z87891 Personal history of nicotine dependence: Secondary | ICD-10-CM | POA: Diagnosis not present

## 2016-09-16 DIAGNOSIS — K567 Ileus, unspecified: Secondary | ICD-10-CM | POA: Diagnosis not present

## 2016-09-16 DIAGNOSIS — Z7901 Long term (current) use of anticoagulants: Secondary | ICD-10-CM | POA: Diagnosis not present

## 2016-09-16 DIAGNOSIS — I10 Essential (primary) hypertension: Secondary | ICD-10-CM | POA: Diagnosis not present

## 2016-09-16 DIAGNOSIS — Z85038 Personal history of other malignant neoplasm of large intestine: Secondary | ICD-10-CM | POA: Diagnosis not present

## 2016-09-16 DIAGNOSIS — R1032 Left lower quadrant pain: Secondary | ICD-10-CM | POA: Diagnosis present

## 2016-09-16 DIAGNOSIS — I130 Hypertensive heart and chronic kidney disease with heart failure and stage 1 through stage 4 chronic kidney disease, or unspecified chronic kidney disease: Secondary | ICD-10-CM | POA: Diagnosis present

## 2016-09-16 DIAGNOSIS — Z903 Acquired absence of stomach [part of]: Secondary | ICD-10-CM | POA: Diagnosis not present

## 2016-09-16 DIAGNOSIS — K5669 Other partial intestinal obstruction: Secondary | ICD-10-CM | POA: Diagnosis present

## 2016-09-16 DIAGNOSIS — N183 Chronic kidney disease, stage 3 (moderate): Secondary | ICD-10-CM | POA: Diagnosis present

## 2016-09-16 DIAGNOSIS — D509 Iron deficiency anemia, unspecified: Secondary | ICD-10-CM | POA: Diagnosis present

## 2016-09-16 DIAGNOSIS — Z7983 Long term (current) use of bisphosphonates: Secondary | ICD-10-CM | POA: Diagnosis not present

## 2016-09-16 DIAGNOSIS — R110 Nausea: Secondary | ICD-10-CM | POA: Diagnosis not present

## 2016-09-16 DIAGNOSIS — K219 Gastro-esophageal reflux disease without esophagitis: Secondary | ICD-10-CM | POA: Diagnosis present

## 2016-09-16 DIAGNOSIS — Z9049 Acquired absence of other specified parts of digestive tract: Secondary | ICD-10-CM | POA: Diagnosis not present

## 2016-09-16 DIAGNOSIS — I5032 Chronic diastolic (congestive) heart failure: Secondary | ICD-10-CM | POA: Diagnosis present

## 2016-09-16 DIAGNOSIS — D631 Anemia in chronic kidney disease: Secondary | ICD-10-CM | POA: Diagnosis present

## 2016-09-16 DIAGNOSIS — Z23 Encounter for immunization: Secondary | ICD-10-CM | POA: Diagnosis present

## 2016-09-16 DIAGNOSIS — E538 Deficiency of other specified B group vitamins: Secondary | ICD-10-CM | POA: Diagnosis present

## 2016-09-16 LAB — CBC
HCT: 37.6 % — ABNORMAL LOW (ref 39.0–52.0)
Hemoglobin: 12.5 g/dL — ABNORMAL LOW (ref 13.0–17.0)
MCH: 30.6 pg (ref 26.0–34.0)
MCHC: 33.2 g/dL (ref 30.0–36.0)
MCV: 92.2 fL (ref 78.0–100.0)
Platelets: 150 10*3/uL (ref 150–400)
RBC: 4.08 MIL/uL — ABNORMAL LOW (ref 4.22–5.81)
RDW: 14.1 % (ref 11.5–15.5)
WBC: 8.3 10*3/uL (ref 4.0–10.5)

## 2016-09-16 LAB — COMPREHENSIVE METABOLIC PANEL
ALK PHOS: 61 U/L (ref 38–126)
ALT: 13 U/L — ABNORMAL LOW (ref 17–63)
ANION GAP: 7 (ref 5–15)
AST: 21 U/L (ref 15–41)
Albumin: 3.2 g/dL — ABNORMAL LOW (ref 3.5–5.0)
BUN: 8 mg/dL (ref 6–20)
CALCIUM: 7.5 mg/dL — AB (ref 8.9–10.3)
CHLORIDE: 108 mmol/L (ref 101–111)
CO2: 25 mmol/L (ref 22–32)
CREATININE: 1.31 mg/dL — AB (ref 0.61–1.24)
GFR, EST NON AFRICAN AMERICAN: 56 mL/min — AB (ref >60)
Glucose, Bld: 86 mg/dL (ref 65–99)
Potassium: 3.7 mmol/L (ref 3.5–5.1)
SODIUM: 140 mmol/L (ref 135–145)
Total Bilirubin: 0.6 mg/dL (ref 0.3–1.2)
Total Protein: 5.5 g/dL — ABNORMAL LOW (ref 6.5–8.1)

## 2016-09-16 MED ORDER — BISACODYL 10 MG RE SUPP
10.0000 mg | Freq: Two times a day (BID) | RECTAL | Status: DC
  Administered 2016-09-16 – 2016-09-19 (×7): 10 mg via RECTAL
  Filled 2016-09-16 (×7): qty 1

## 2016-09-16 NOTE — Progress Notes (Signed)
PROGRESS NOTE    [name]  QHU:[id number] DOB: [birth year] DOA: 09/15/2016 PCP: [name], MD    Brief Narrative:  66 year old male with multiple abdominal surgeries in the past, presents with nausea and abdominal pain. Found to have ileus versus partial small bowel obstruction. He's had multiple admissions for the same issue. Started on supportive management with bowel rest, IV fluids. Started on Dulcolax suppositories and encouraged to ambulate. Hopefully, ileus will resolve with supportive measures.   Assessment & Plan:   Active Problems:   Personal history of PE (pulmonary embolism)   Left sided abdominal pain   Partial small bowel obstruction (HCC)   Nausea   Essential hypertension   Ileus (HCC)   Seizure disorder ([place])   1. Ileus versus small bowel obstruction. Started on local and suppositories. He is having some bowel movements. Started on clear liquids. If fails to improve, can consider further imaging with CT scan. 2. History of pulmonary embolism. Continue on home dose of Lovenox. 3. Seizure disorder. Continue Keppra twice a day 4. 4. Hypertension. Oral Coreg currently on hold. Receiving IV metoprolol. We'll increase her Coreg if he is tolerating clear liquids.   DVT prophylaxis: Lovenox Code Status: Full code Family Communication: Disposition Plan: Discharge home once improved   Consultants:     Procedures:     Antimicrobials:       Subjective: Patient had small bowel movement after suppository. Had some nausea this morning which improved with antiemetics.  Objective: Vitals:   09/16/16 0446 09/16/16 0949 09/16/16 1123 09/16/16 1438  BP: (!) 144/76 (!) 156/78 (!) 165/89 (!) 144/105  Pulse: 69 64 65 75  Resp: 18 17  18   Temp: 97.9 F (36.6 C) 98.4 F (36.9 C)  98.4 F (36.9 C)  TempSrc: Oral Oral  Oral  SpO2: 94% 95%  98%  Weight:      Height:        Intake/Output Summary (Last 24 hours) at 09/16/16 1524 Last data filed at  09/16/16 0314  Gross per 24 hour  Intake            107.5 ml  Output                0 ml  Net            107.5 ml   Filed Weights   09/15/16 1250 09/15/16 2130  Weight: 72.1 kg (159 lb) 72 kg (158 lb 11.7 oz)    Examination:  General exam: Appears calm and comfortable  Respiratory system: Clear to auscultation. Respiratory effort normal. Cardiovascular system: S1 & S2 heard, RRR. No JVD, murmurs, rubs, gallops or clicks. No pedal edema. Gastrointestinal system: Abdomen is nondistended, soft and nontender. No organomegaly or masses felt. Normal bowel sounds heard. Central nervous system: Alert and oriented. No focal neurological deficits. Extremities: Symmetric 5 x 5 power. Skin: No rashes, lesions or ulcers Psychiatry: Judgement and insight appear normal. Mood & affect appropriate.     Data Reviewed: I have personally reviewed following labs and imaging studies  CBC:  Recent Labs Lab 09/15/16 1302 09/16/16 0447  WBC 7.5 8.3  NEUTROABS 4.7  --   HGB 14.4 12.5*  HCT 42.0 37.6*  MCV 90.5 92.2  PLT 175 465   Basic Metabolic Panel:  Recent Labs Lab 09/15/16 1302 09/16/16 0447  NA 138 140  K 4.2 3.7  CL 102 108  CO2 25 25  GLUCOSE 104* 86  BUN 8 8  CREATININE 1.52* 1.31*  CALCIUM 9.0 7.5*   GFR: Estimated Creatinine Clearance: 56.2 mL/min (A) (by C-G formula based on SCr of 1.31 mg/dL (H)). Liver Function Tests:  Recent Labs Lab 09/15/16 1302 09/16/16 0447  AST 21 21  ALT 17 13*  ALKPHOS 74 61  BILITOT 0.5 0.6  PROT 6.8 5.5*  ALBUMIN 4.0 3.2*    Recent Labs Lab 09/15/16 1302  LIPASE 22   No results for input(s): AMMONIA in the last 168 hours. Coagulation Profile: No results for input(s): INR, PROTIME in the last 168 hours. Cardiac Enzymes: No results for input(s): CKTOTAL, CKMB, CKMBINDEX, TROPONINI in the last 168 hours. BNP (last 3 results) No results for input(s): PROBNP in the last 8760 hours. HbA1C: No results for input(s): HGBA1C in  the last 72 hours. CBG: No results for input(s): GLUCAP in the last 168 hours. Lipid Profile: No results for input(s): CHOL, HDL, LDLCALC, TRIG, CHOLHDL, LDLDIRECT in the last 72 hours. Thyroid Function Tests: No results for input(s): TSH, T4TOTAL, FREET4, T3FREE, THYROIDAB in the last 72 hours. Anemia Panel: No results for input(s): VITAMINB12, FOLATE, FERRITIN, TIBC, IRON, RETICCTPCT in the last 72 hours. Sepsis Labs: No results for input(s): PROCALCITON, LATICACIDVEN in the last 168 hours.  No results found for this or any previous visit (from the past 240 hour(s)).       Radiology Studies: Dg Abdomen Acute W/chest  Result Date: 09/15/2016 CLINICAL DATA:  Left lower quadrant abdominal pain with nausea EXAM: DG ABDOMEN ACUTE W/ 1V CHEST COMPARISON:  08/21/2016 FINDINGS: Single-view chest demonstrates left-sided central venous port tip to overlie the SVC. Linear atelectasis or scarring at the bases. No acute consolidation or effusion. Stable cardiomediastinal silhouette. No pneumothorax. Supine and upright views of the abdomen demonstrate no free air beneath the diaphragm. Postsurgical changes in the upper abdomen and left mid abdomen. Similar appearance of gas-filled mostly large bowel in the central abdomen and pelvis. IMPRESSION: 1. No radiographic evidence for acute cardiopulmonary abnormality 2. Mild gaseous dilatation of mostly large bowel with decreased small bowel distention since the previous examination. No free air. Electronically Signed   By: Donavan Foil M.D.   On: 09/15/2016 18:23        Scheduled Meds: . bisacodyl  10 mg Rectal BID  . enoxaparin (LOVENOX) injection  60 mg Subcutaneous Q24H  . metoprolol tartrate  2.5 mg Intravenous Q6H  . pantoprazole (PROTONIX) IV  40 mg Intravenous Q24H   Continuous Infusions: . levETIRAcetam Stopped (09/16/16 0944)     LOS: 0 days    Time spent: 64mins    Kailer Heindel, MD [facility] Pager [phone]  If  7PM-7AM, please contact night-coverage [email or web address] Password [facility] 09/16/2016, 3:24 PM

## 2016-09-17 LAB — HIV ANTIBODY (ROUTINE TESTING W REFLEX): HIV SCREEN 4TH GENERATION: NONREACTIVE

## 2016-09-17 MED ORDER — LEVETIRACETAM 250 MG PO TABS
750.0000 mg | ORAL_TABLET | Freq: Two times a day (BID) | ORAL | Status: DC
  Administered 2016-09-17 – 2016-09-19 (×4): 750 mg via ORAL
  Filled 2016-09-17 (×4): qty 1

## 2016-09-17 MED ORDER — CARVEDILOL 3.125 MG PO TABS
6.2500 mg | ORAL_TABLET | Freq: Two times a day (BID) | ORAL | Status: DC
  Administered 2016-09-18 – 2016-09-19 (×3): 6.25 mg via ORAL
  Filled 2016-09-17 (×3): qty 2

## 2016-09-17 MED ORDER — ATORVASTATIN CALCIUM 20 MG PO TABS
20.0000 mg | ORAL_TABLET | Freq: Every day | ORAL | Status: DC
  Administered 2016-09-17 – 2016-09-18 (×2): 20 mg via ORAL
  Filled 2016-09-17 (×2): qty 1

## 2016-09-17 MED ORDER — POLYETHYLENE GLYCOL 3350 17 G PO PACK
17.0000 g | PACK | Freq: Two times a day (BID) | ORAL | Status: DC
  Administered 2016-09-17 – 2016-09-18 (×3): 17 g via ORAL
  Filled 2016-09-17 (×3): qty 1

## 2016-09-17 MED ORDER — SUCRALFATE 1 GM/10ML PO SUSP
1.0000 g | Freq: Four times a day (QID) | ORAL | Status: DC
  Administered 2016-09-17 – 2016-09-19 (×7): 1 g via ORAL
  Filled 2016-09-17 (×7): qty 10

## 2016-09-17 MED ORDER — MAGNESIUM OXIDE 400 (241.3 MG) MG PO TABS
400.0000 mg | ORAL_TABLET | Freq: Two times a day (BID) | ORAL | Status: DC
  Administered 2016-09-17 – 2016-09-19 (×4): 400 mg via ORAL
  Filled 2016-09-17 (×4): qty 1

## 2016-09-17 MED ORDER — LORAZEPAM 1 MG PO TABS: 1.0000 mg | ORAL_TABLET | Freq: Three times a day (TID) | ORAL | Status: DC | PRN

## 2016-09-17 MED ORDER — TAMSULOSIN HCL 0.4 MG PO CAPS
0.4000 mg | ORAL_CAPSULE | Freq: Every day | ORAL | Status: DC
  Administered 2016-09-17 – 2016-09-18 (×2): 0.4 mg via ORAL
  Filled 2016-09-17 (×2): qty 1

## 2016-09-17 MED ORDER — PROMETHAZINE HCL 25 MG/ML IJ SOLN
12.5000 mg | Freq: Four times a day (QID) | INTRAMUSCULAR | Status: DC | PRN
  Administered 2016-09-17 – 2016-09-19 (×6): 12.5 mg via INTRAVENOUS
  Filled 2016-09-17 (×6): qty 1

## 2016-09-17 NOTE — Care Management Note (Signed)
Case Management Note  Patient Details  Name: SUNIL HUE MRN: [id number] Date of Birth: [birth year]  Subjective/Objective:  Chart reviewed. Patient adm with ileus. From home alone, ind PTA. Ambulatory in hallway.               Action/Plan:Plans to return home with self care. No CM needs anticipated.   Expected Discharge Date:       09/19/2016           Expected Discharge Plan:  Home/Self Care  In-House Referral:     Discharge planning Services  CM Consult  Post Acute Care Choice:    Choice offered to:  NA  DME Arranged:    DME Agency:     HH Arranged:    HH Agency:     Status of Service:  Completed, signed off  If discussed at [employer] of Stay Meetings, dates discussed:    Additional Comments:  Charidy Cappelletti, Chauncey Reading, RN 09/17/2016, 1:27 PM

## 2016-09-17 NOTE — Progress Notes (Signed)
PROGRESS NOTE    CONSTANCE HACKENBERG  GBT:[id number] DOB: 09/18/1950 DOA: 09/15/2016 PCP: [name], MD    Brief Narrative:  66 year old male with multiple abdominal surgeries in the past, presents with nausea and abdominal pain. Found to have ileus versus partial small bowel obstruction. He's had multiple admissions for the same issue. Started on supportive management with bowel rest, IV fluids. Started on Dulcolax suppositories and encouraged to ambulate. Hopefully, ileus will resolve with supportive measures.   Assessment & Plan:   Active Problems:   Personal history of PE (pulmonary embolism)   Left sided abdominal pain   Partial small bowel obstruction (HCC)   Nausea   Essential hypertension   Ileus (HCC)   Seizure disorder ([place])   1. Ileus versus small bowel obstruction. Started on dulcolax suppositories. He is having some bowel movements. Appears to be tolerating clear liquids. Add miralax. Advance to full liquids. Encourage ambulation. 2. History of pulmonary embolism. Continue on home dose of Lovenox. 3. Seizure disorder. Continue Keppra twice a day. 4. Hypertension. Restart coreg 5. HLD. Continue on statin 6. GERD. Continue on PPI and carafate   DVT prophylaxis: Lovenox Code Status: Full code Family Communication: Disposition Plan: Discharge home once improved   Consultants:     Procedures:     Antimicrobials:       Subjective: Having some bowel movements with suppositories. No vomiting. Tolerating clear liquids. Still has nausea  Objective: Vitals:   09/16/16 1718 09/16/16 2146 09/17/16 0600 09/17/16 1420  BP:  (!) 145/83 (!) 153/91 (!) 145/91  Pulse: 74 75 87 78  Resp:  16 16 17   Temp:  98.4 F (36.9 C) 97.7 F (36.5 C) 98.4 F (36.9 C)  TempSrc:  Oral Oral Oral  SpO2: 93% 94% 94% 97%  Weight:      Height:       No intake or output data in the 24 hours ending 09/17/16 1807 Filed Weights   09/15/16 1250 09/15/16 2130  Weight: 72.1  kg (159 lb) 72 kg (158 lb 11.7 oz)    Examination:  General exam: Alert, awake, oriented x 3 Respiratory system: Clear to auscultation. Respiratory effort normal. Cardiovascular system:RRR. No murmurs, rubs, gallops. Gastrointestinal system: Abdomen is nondistended, soft and nontender. No organomegaly or masses felt. Normal bowel sounds heard. Central nervous system: Alert and oriented. No focal neurological deficits. Extremities: No C/C/E, +pedal pulses Skin: No rashes, lesions or ulcers Psychiatry: Judgement and insight appear normal. Mood & affect appropriate.      Data Reviewed: I have personally reviewed following labs and imaging studies  CBC:  Recent Labs Lab 09/15/16 1302 09/16/16 0447  WBC 7.5 8.3  NEUTROABS 4.7  --   HGB 14.4 12.5*  HCT 42.0 37.6*  MCV 90.5 92.2  PLT 175 710   Basic Metabolic Panel:  Recent Labs Lab 09/15/16 1302 09/16/16 0447  NA 138 140  K 4.2 3.7  CL 102 108  CO2 25 25  GLUCOSE 104* 86  BUN 8 8  CREATININE 1.52* 1.31*  CALCIUM 9.0 7.5*   GFR: Estimated Creatinine Clearance: 56.2 mL/min (A) (by C-G formula based on SCr of 1.31 mg/dL (H)). Liver Function Tests:  Recent Labs Lab 09/15/16 1302 09/16/16 0447  AST 21 21  ALT 17 13*  ALKPHOS 74 61  BILITOT 0.5 0.6  PROT 6.8 5.5*  ALBUMIN 4.0 3.2*    Recent Labs Lab 09/15/16 1302  LIPASE 22   No results for input(s): AMMONIA in the last 168 hours.  Coagulation Profile: No results for input(s): INR, PROTIME in the last 168 hours. Cardiac Enzymes: No results for input(s): CKTOTAL, CKMB, CKMBINDEX, TROPONINI in the last 168 hours. BNP (last 3 results) No results for input(s): PROBNP in the last 8760 hours. HbA1C: No results for input(s): HGBA1C in the last 72 hours. CBG: No results for input(s): GLUCAP in the last 168 hours. Lipid Profile: No results for input(s): CHOL, HDL, LDLCALC, TRIG, CHOLHDL, LDLDIRECT in the last 72 hours. Thyroid Function Tests: No results for  input(s): TSH, T4TOTAL, FREET4, T3FREE, THYROIDAB in the last 72 hours. Anemia Panel: No results for input(s): VITAMINB12, FOLATE, FERRITIN, TIBC, IRON, RETICCTPCT in the last 72 hours. Sepsis Labs: No results for input(s): PROCALCITON, LATICACIDVEN in the last 168 hours.  No results found for this or any previous visit (from the past 240 hour(s)).       Radiology Studies: Dg Abdomen Acute W/chest  Result Date: 09/15/2016 CLINICAL DATA:  Left lower quadrant abdominal pain with nausea EXAM: DG ABDOMEN ACUTE W/ 1V CHEST COMPARISON:  08/21/2016 FINDINGS: Single-view chest demonstrates left-sided central venous port tip to overlie the SVC. Linear atelectasis or scarring at the bases. No acute consolidation or effusion. Stable cardiomediastinal silhouette. No pneumothorax. Supine and upright views of the abdomen demonstrate no free air beneath the diaphragm. Postsurgical changes in the upper abdomen and left mid abdomen. Similar appearance of gas-filled mostly large bowel in the central abdomen and pelvis. IMPRESSION: 1. No radiographic evidence for acute cardiopulmonary abnormality 2. Mild gaseous dilatation of mostly large bowel with decreased small bowel distention since the previous examination. No free air. Electronically Signed   By: Donavan Foil M.D.   On: 09/15/2016 18:23        Scheduled Meds: . bisacodyl  10 mg Rectal BID  . enoxaparin (LOVENOX) injection  60 mg Subcutaneous Q24H  . metoprolol tartrate  2.5 mg Intravenous Q6H  . pantoprazole (PROTONIX) IV  40 mg Intravenous Q24H  . polyethylene glycol  17 g Oral BID   Continuous Infusions: . levETIRAcetam Stopped (09/17/16 0948)     LOS: 1 day    Time spent: 71mins    Shemika Robbs, MD [facility] Pager [phone]  If 7PM-7AM, please contact night-coverage [email or web address] Password [facility] 09/17/2016, 6:07 PM

## 2016-09-17 NOTE — Care Management Important Message (Signed)
Important Message  Patient Details  Name: ISAHI GODWIN MRN: [id number] Date of Birth: July 13, 1950   Medicare Important Message Given:  Yes    Taleigha Pinson, [place]uncey [place], RN 09/17/2016, 1:26 PM

## 2016-09-18 LAB — BASIC METABOLIC PANEL
Anion gap: 8 (ref 5–15)
BUN: 7 mg/dL (ref 6–20)
CALCIUM: 7.7 mg/dL — AB (ref 8.9–10.3)
CHLORIDE: 104 mmol/L (ref 101–111)
CO2: 26 mmol/L (ref 22–32)
CREATININE: 1.5 mg/dL — AB (ref 0.61–1.24)
GFR calc Af Amer: 55 mL/min — ABNORMAL LOW (ref >60)
GFR calc non Af Amer: 47 mL/min — ABNORMAL LOW (ref >60)
GLUCOSE: 89 mg/dL (ref 65–99)
Potassium: 3.7 mmol/L (ref 3.5–5.1)
Sodium: 138 mmol/L (ref 135–145)

## 2016-09-18 MED ORDER — HYDROCODONE-ACETAMINOPHEN 5-325 MG PO TABS
1.0000 | ORAL_TABLET | ORAL | Status: DC | PRN
  Administered 2016-09-18 – 2016-09-19 (×2): 2 via ORAL
  Filled 2016-09-18 (×2): qty 2

## 2016-09-18 MED ORDER — POLYETHYLENE GLYCOL 3350 17 G PO PACK
17.0000 g | PACK | Freq: Three times a day (TID) | ORAL | Status: DC
  Administered 2016-09-18 – 2016-09-19 (×2): 17 g via ORAL
  Filled 2016-09-18 (×2): qty 1

## 2016-09-18 NOTE — Progress Notes (Signed)
PROGRESS NOTE    [name]  LTJ:[id number] DOB: Jan 16, 1951 DOA: 09/15/2016 PCP: [name], MD    Brief Narrative:  66 year old male with multiple abdominal surgeries in the past, presents with nausea and abdominal pain. Found to have ileus versus partial small bowel obstruction. He's had multiple admissions for the same issue. Started on supportive management with bowel rest, IV fluids. Started on Dulcolax suppositories and encouraged to ambulate. Hopefully, ileus will resolve with supportive measures.   Assessment & Plan:   Active Problems:   Personal history of PE (pulmonary embolism)   Left sided abdominal pain   Partial small bowel obstruction (HCC)   Nausea   Essential hypertension   Ileus (HCC)   Seizure disorder ([place])   1. Ileus versus small bowel obstruction. Continued on dulcolax suppositories and MiraLAX. He is having some bowel movements and passing flatus. Appears to be tolerating full liquids and advance diet to soft foods. Encourage ambulation. 2. History of pulmonary embolism. Continue on home dose of Lovenox. 3. Seizure disorder. Continue Keppra twice a day. 4. Hypertension. Continue on coreg 5. HLD. Continue on statin 6. GERD. Continue on PPI and carafate   DVT prophylaxis: Lovenox Code Status: Full code Family Communication: Disposition Plan: Discharge home once improved   Consultants:     Procedures:     Antimicrobials:       Subjective: Continues to have bowel movements. No vomiting. Tolerating full liquids. Still has nausea  Objective: Vitals:   09/17/16 2100 09/18/16 0400 09/18/16 0628 09/18/16 1300  BP: (!) 153/82 138/72  126/80  Pulse: 69 72  87  Resp: 16 16  18   Temp: 98.2 F (36.8 C) 98.4 F (36.9 C)  98.7 F (37.1 C)  TempSrc: Oral Oral  Oral  SpO2: 96% 95%  93%  Weight:   69.7 kg (153 lb 10.6 oz)   Height:        Intake/Output Summary (Last 24 hours) at 09/18/16 1732 Last data filed at 09/18/16 1627  Gross per 24 hour  Intake              240 ml  Output              450 ml  Net             -210 ml   Filed Weights   09/15/16 1250 09/15/16 2130 09/18/16 0628  Weight: 72.1 kg (159 lb) 72 kg (158 lb 11.7 oz) 69.7 kg (153 lb 10.6 oz)    Examination:  General exam: Alert, awake, oriented x 3 Respiratory system: Clear to auscultation. Respiratory effort normal. Cardiovascular system:RRR. No murmurs, rubs, gallops. Gastrointestinal system: Abdomen is nondistended, soft and nontender. No organomegaly or masses felt. Normal bowel sounds heard. Central nervous system: Alert and oriented. No focal neurological deficits. Extremities: No C/C/E, +pedal pulses Skin: No rashes, lesions or ulcers Psychiatry: Judgement and insight appear normal. Mood & affect appropriate.    Data Reviewed: I have personally reviewed following labs and imaging studies  CBC:  Recent Labs Lab 09/15/16 1302 09/16/16 0447  WBC 7.5 8.3  NEUTROABS 4.7  --   HGB 14.4 12.5*  HCT 42.0 37.6*  MCV 90.5 92.2  PLT 175 076   Basic Metabolic Panel:  Recent Labs Lab 09/15/16 1302 09/16/16 0447 09/18/16 0622  NA 138 140 138  K 4.2 3.7 3.7  CL 102 108 104  CO2 25 25 26   GLUCOSE 104* 86 89  BUN 8 8 7   CREATININE 1.52* 1.31*  1.50*  CALCIUM 9.0 7.5* 7.7*   GFR: Estimated Creatinine Clearance: 48.4 mL/min (A) (by C-G formula based on SCr of 1.5 mg/dL (H)). Liver Function Tests:  Recent Labs Lab 09/15/16 1302 09/16/16 0447  AST 21 21  ALT 17 13*  ALKPHOS 74 61  BILITOT 0.5 0.6  PROT 6.8 5.5*  ALBUMIN 4.0 3.2*    Recent Labs Lab 09/15/16 1302  LIPASE 22   No results for input(s): AMMONIA in the last 168 hours. Coagulation Profile: No results for input(s): INR, PROTIME in the last 168 hours. Cardiac Enzymes: No results for input(s): CKTOTAL, CKMB, CKMBINDEX, TROPONINI in the last 168 hours. BNP (last 3 results) No results for input(s): PROBNP in the last 8760 hours. HbA1C: No results for  input(s): HGBA1C in the last 72 hours. CBG: No results for input(s): GLUCAP in the last 168 hours. Lipid Profile: No results for input(s): CHOL, HDL, LDLCALC, TRIG, CHOLHDL, LDLDIRECT in the last 72 hours. Thyroid Function Tests: No results for input(s): TSH, T4TOTAL, FREET4, T3FREE, THYROIDAB in the last 72 hours. Anemia Panel: No results for input(s): VITAMINB12, FOLATE, FERRITIN, TIBC, IRON, RETICCTPCT in the last 72 hours. Sepsis Labs: No results for input(s): PROCALCITON, LATICACIDVEN in the last 168 hours.  No results found for this or any previous visit (from the past 240 hour(s)).       Radiology Studies: No results found.      Scheduled Meds: . atorvastatin  20 mg Oral QHS  . bisacodyl  10 mg Rectal BID  . carvedilol  6.25 mg Oral BID WC  . enoxaparin (LOVENOX) injection  60 mg Subcutaneous Q24H  . levETIRAcetam  750 mg Oral BID  . magnesium oxide  400 mg Oral BID  . pantoprazole (PROTONIX) IV  40 mg Intravenous Q24H  . polyethylene glycol  17 g Oral TID  . sucralfate  1 g Oral QID  . tamsulosin  0.4 mg Oral QHS   Continuous Infusions:    LOS: 2 days    Time spent: 60mins    Tong Pieczynski, MD [facility] Pager [phone]  If 7PM-7AM, please contact night-coverage [email or web address] Password [facility] 09/18/2016, 5:32 PM

## 2016-09-19 MED ORDER — PROMETHAZINE HCL 12.5 MG PO TABS: 12.5000 mg | ORAL_TABLET | Freq: Four times a day (QID) | ORAL | 0 refills | Status: DC | PRN

## 2016-09-19 MED ORDER — PANTOPRAZOLE SODIUM 40 MG PO TBEC
40.0000 mg | DELAYED_RELEASE_TABLET | Freq: Every day | ORAL | Status: DC
  Administered 2016-09-19: 40 mg via ORAL
  Filled 2016-09-19: qty 1

## 2016-09-19 MED ORDER — HEPARIN SOD (PORK) LOCK FLUSH 100 UNIT/ML IV SOLN
500.0000 [IU] | INTRAVENOUS | Status: DC | PRN
  Filled 2016-09-19: qty 5

## 2016-09-19 MED ORDER — BISACODYL 10 MG RE SUPP: 10.0000 mg | Freq: Every day | RECTAL | 0 refills | Status: DC | PRN

## 2016-09-19 MED ORDER — POLYETHYLENE GLYCOL 3350 17 G PO PACK: 17.0000 g | PACK | Freq: Every day | ORAL | 0 refills | Status: DC

## 2016-09-19 NOTE — Progress Notes (Signed)
Patient understands discharge instruction.  Verbalizes understanding.    Roselind Messier RN

## 2016-09-19 NOTE — Discharge Summary (Addendum)
Physician Discharge Summary  [name] TDD:[id number] DOB: 1951-01-16 DOA: 09/15/2016  PCP: [name], MD  Admit date: 09/15/2016 Discharge date: 09/19/2016  Admitted From: home Disposition:  home  Recommendations for Outpatient Follow-up:  1. Follow up with PCP in 1-2 weeks 2. Please obtain BMP/CBC in one week   Home Health: Equipment/Devices:  Discharge Condition: stable CODE STATUS:full Diet recommendation: Heart Healthy   Brief/Interim Summary: 66 year old male with history of multiple abdominal surgeries in the past, presented to the hospital with nausea and abdominal pain. He was found to have an ileus with partial small bowel obstruction. He had multiple admissions for the same issue. He was started on supportive management with bowel rest, IV fluids. He did not require NG tube decompression. He was also started on Dulcolax suppositories and MiraLAX and began having several bowel movements. He was encouraged to ambulate. After having several bowel movements, he was able to tolerate clear liquids and his diet was advanced to solid food. He appears to have tolerated advancing his diet fairly well and has not had any worsening pain. He is not having any vomiting. Patient is ready for discharge home. He'll be continued on MiraLAX and use suppositories as needed.  Discharge Diagnoses:  Active Problems:   Personal history of PE (pulmonary embolism)   Left sided abdominal pain   Partial small bowel obstruction (HCC)   Nausea   Essential hypertension   Ileus (HCC)   Seizure disorder (HCC) Chronic diastolic CHF   Discharge Instructions  Discharge Instructions    Diet - low sodium heart healthy    Complete by:  As directed    Increase activity slowly    Complete by:  As directed      Allergies as of 09/19/2016   No Known Allergies     Medication List    TAKE these medications   acetaminophen 500 MG tablet Commonly known as:  TYLENOL Take 500 mg by mouth  every 6 (six) hours as needed.   acidophilus Caps capsule Take 1 capsule by mouth daily.   atorvastatin 20 MG tablet Commonly known as:  LIPITOR Take 20 mg by mouth at bedtime.   bisacodyl 10 MG suppository Commonly known as:  DULCOLAX Place 1 suppository (10 mg total) rectally daily as needed for moderate constipation.   carvedilol 6.25 MG tablet Commonly known as:  COREG Take 6.25 mg by mouth 2 (two) times daily with a meal.   cholecalciferol 1000 units tablet Commonly known as:  VITAMIN D Take 1,000 Units by mouth daily.   Copper Gluconate 2 MG Caps Take 1 capsule by mouth daily.   cyanocobalamin 1000 MCG/ML injection Commonly known as:  (VITAMIN B-12) Inject 1,000 mcg into the muscle every 30 (thirty) days.   docusate sodium 100 MG capsule Commonly known as:  COLACE Take 100 mg by mouth daily as needed.   enoxaparin 60 MG/0.6ML injection Commonly known as:  LOVENOX Inject 60 mg into the skin at bedtime.   ENSURE PLUS HN Liqd Take 1 Bottle by mouth every other day.   ferrous sulfate 325 (65 FE) MG tablet Take 1 tablet (325 mg total) by mouth 2 (two) times daily with a meal.   lansoprazole 30 MG capsule Commonly known as:  PREVACID Take 1 capsule (30 mg total) by mouth daily at 12 noon.   levETIRAcetam 750 MG tablet Commonly known as:  KEPPRA Take 750 mg by mouth 2 (two) times daily.   lidocaine-prilocaine cream Commonly known as:  EMLA Apply  1 application topically as needed (prior to accessing port).   LORazepam 1 MG tablet Commonly known as:  ATIVAN Take 1 tablet (1 mg total) by mouth every 8 (eight) hours as needed for anxiety.   MAGNESIUM-OXIDE 400 (241.3 Mg) MG tablet Generic drug:  magnesium oxide Take 400 mg by mouth 2 (two) times daily.   multivitamin with minerals Tabs tablet Take 1 tablet by mouth daily.   polyethylene glycol packet Commonly known as:  MIRALAX / GLYCOLAX Take 17 g by mouth daily. What changed:  when to take  this  reasons to take this   PROAIR HFA 108 (90 Base) MCG/ACT inhaler Generic drug:  albuterol Inhale 1-2 puffs into the lungs every 6 (six) hours as needed for wheezing or shortness of breath.   prochlorperazine 10 MG tablet Commonly known as:  COMPAZINE Take 10 mg by mouth every 6 (six) hours as needed for nausea or vomiting.   promethazine 12.5 MG tablet Commonly known as:  PHENERGAN Take 1 tablet (12.5 mg total) by mouth every 6 (six) hours as needed for nausea or vomiting.   RECLAST 5 MG/100ML Soln injection Generic drug:  zoledronic acid Inject 5 mg into the vein See admin instructions. Pt gets a dose yearly.   sucralfate 1 GM/10ML suspension Commonly known as:  CARAFATE Take 10 mLs (1 g total) by mouth 4 (four) times daily.   tamsulosin 0.4 MG Caps capsule Commonly known as:  FLOMAX Take 0.4 mg by mouth at bedtime.   Vitamin D (Ergocalciferol) 50000 units Caps capsule Commonly known as:  DRISDOL Take 50,000 Units by mouth every Sunday.       No Known Allergies  Consultations:     Procedures/Studies: Dg Abdomen Acute W/chest  Result Date: 09/15/2016 CLINICAL DATA:  Left lower quadrant abdominal pain with nausea EXAM: DG ABDOMEN ACUTE W/ 1V CHEST COMPARISON:  08/21/2016 FINDINGS: Single-view chest demonstrates left-sided central venous port tip to overlie the SVC. Linear atelectasis or scarring at the bases. No acute consolidation or effusion. Stable cardiomediastinal silhouette. No pneumothorax. Supine and upright views of the abdomen demonstrate no free air beneath the diaphragm. Postsurgical changes in the upper abdomen and left mid abdomen. Similar appearance of gas-filled mostly large bowel in the central abdomen and pelvis. IMPRESSION: 1. No radiographic evidence for acute cardiopulmonary abnormality 2. Mild gaseous dilatation of mostly large bowel with decreased small bowel distention since the previous examination. No free air. Electronically Signed   By: Donavan Foil M.D.   On: 09/15/2016 18:23   Dg Abd Acute W/chest  Result Date: 08/21/2016 CLINICAL DATA:  Acute onset of nausea, vomiting and diarrhea. Right upper quadrant burning. Initial encounter. EXAM: DG ABDOMEN ACUTE W/ 1V CHEST COMPARISON:  Abdominal radiograph performed 06/13/2016, and chest radiograph performed 05/19/2016 FINDINGS: The lungs are well-aerated and clear. There is no evidence of focal opacification, pleural effusion or pneumothorax. The cardiomediastinal silhouette is within normal limits. A left-sided chest port is noted ending about the mid SVC. Air-filled loops of small and large bowel are seen, with a few air-fluid levels noted on the upright view. This may reflect mild bowel dysmotility. There is no definite evidence of bowel obstruction. No free intra-abdominal air is identified on the provided upright view. Scattered clips are seen about the abdomen. A bowel suture line is noted at the left mid abdomen. No acute osseous abnormalities are seen; the sacroiliac joints are unremarkable in appearance. IMPRESSION: 1. Air-filled loops of small and large bowel, with a few air-fluid  levels noted. This may reflect mild bowel dysmotility. No definite evidence of bowel obstruction. No free intra-abdominal air seen. 2. No acute cardiopulmonary process identified. Electronically Signed   By: Garald Balding M.D.   On: 08/21/2016 21:54       Subjective: No vomiting, having bowel movements. Tolerating solid food  Discharge Exam: Vitals:   09/18/16 2100 09/19/16 0520  BP: 113/74 (!) 94/57  Pulse: 81 65  Resp: 20 18  Temp: 98.2 F (36.8 C) 98 F (36.7 C)   Vitals:   09/18/16 0628 09/18/16 1300 09/18/16 2100 09/19/16 0520  BP:  126/80 113/74 (!) 94/57  Pulse:  87 81 65  Resp:  18 20 18   Temp:  98.7 F (37.1 C) 98.2 F (36.8 C) 98 F (36.7 C)  TempSrc:  Oral Oral Oral  SpO2:  93% 96% 95%  Weight: 69.7 kg (153 lb 10.6 oz)     Height:        General: Pt is alert, awake, not  in acute distress Cardiovascular: RRR, S1/S2 +, no rubs, no gallops Respiratory: CTA bilaterally, no wheezing, no rhonchi Abdominal: Soft, NT, ND, bowel sounds + Extremities: no edema, no cyanosis    The results of significant diagnostics from this hospitalization (including imaging, microbiology, ancillary and laboratory) are listed below for reference.     Microbiology: No results found for this or any previous visit (from the past 240 hour(s)).   Labs: BNP (last 3 results)  Recent Labs  05/15/16 0208  BNP 26.4   Basic Metabolic Panel:  Recent Labs Lab 09/15/16 1302 09/16/16 0447 09/18/16 0622  NA 138 140 138  K 4.2 3.7 3.7  CL 102 108 104  CO2 25 25 26   GLUCOSE 104* 86 89  BUN 8 8 7   CREATININE 1.52* 1.31* 1.50*  CALCIUM 9.0 7.5* 7.7*   Liver Function Tests:  Recent Labs Lab 09/15/16 1302 09/16/16 0447  AST 21 21  ALT 17 13*  ALKPHOS 74 61  BILITOT 0.5 0.6  PROT 6.8 5.5*  ALBUMIN 4.0 3.2*    Recent Labs Lab 09/15/16 1302  LIPASE 22   No results for input(s): AMMONIA in the last 168 hours. CBC:  Recent Labs Lab 09/15/16 1302 09/16/16 0447  WBC 7.5 8.3  NEUTROABS 4.7  --   HGB 14.4 12.5*  HCT 42.0 37.6*  MCV 90.5 92.2  PLT 175 150   Cardiac Enzymes: No results for input(s): CKTOTAL, CKMB, CKMBINDEX, TROPONINI in the last 168 hours. BNP: Invalid input(s): POCBNP CBG: No results for input(s): GLUCAP in the last 168 hours. D-Dimer No results for input(s): DDIMER in the last 72 hours. Hgb A1c No results for input(s): HGBA1C in the last 72 hours. Lipid Profile No results for input(s): CHOL, HDL, LDLCALC, TRIG, CHOLHDL, LDLDIRECT in the last 72 hours. Thyroid function studies No results for input(s): TSH, T4TOTAL, T3FREE, THYROIDAB in the last 72 hours.  Invalid input(s): FREET3 Anemia work up No results for input(s): VITAMINB12, FOLATE, FERRITIN, TIBC, IRON, RETICCTPCT in the last 72 hours. Urinalysis    Component Value Date/Time    COLORURINE YELLOW 09/15/2016 1700   APPEARANCEUR CLEAR 09/15/2016 1700   LABSPEC 1.017 09/15/2016 1700   PHURINE 5.0 09/15/2016 1700   GLUCOSEU NEGATIVE 09/15/2016 1700   HGBUR NEGATIVE 09/15/2016 1700   BILIRUBINUR NEGATIVE 09/15/2016 1700   KETONESUR NEGATIVE 09/15/2016 1700   PROTEINUR NEGATIVE 09/15/2016 1700   UROBILINOGEN 0.2 11/30/2014 1115   NITRITE NEGATIVE 09/15/2016 1700   LEUKOCYTESUR NEGATIVE 09/15/2016 1700  Sepsis Labs Invalid input(s): PROCALCITONIN,  WBC,  LACTICIDVEN Microbiology No results found for this or any previous visit (from the past 240 hour(s)).   Time coordinating discharge: Over 30 minutes  SIGNED:   Kathie Dike, MD  [facility] 09/19/2016, 6:15 PM Pager   If 7PM-7AM, please contact night-coverage [email or web address] Password [email or web address]

## 2016-10-05 ENCOUNTER — Ambulatory Visit (INDEPENDENT_AMBULATORY_CARE_PROVIDER_SITE_OTHER): Payer: Medicare Other | Admitting: Internal Medicine

## 2016-10-05 ENCOUNTER — Encounter: Payer: Self-pay | Admitting: Internal Medicine

## 2016-10-05 ENCOUNTER — Other Ambulatory Visit: Payer: Self-pay

## 2016-10-05 VITALS — BP 149/93 | HR 82 | Temp 97.9°F | Ht 69.0 in | Wt 155.8 lb

## 2016-10-05 DIAGNOSIS — D509 Iron deficiency anemia, unspecified: Secondary | ICD-10-CM

## 2016-10-05 DIAGNOSIS — R1319 Other dysphagia: Secondary | ICD-10-CM

## 2016-10-05 DIAGNOSIS — D508 Other iron deficiency anemias: Secondary | ICD-10-CM | POA: Diagnosis not present

## 2016-10-05 DIAGNOSIS — Z85038 Personal history of other malignant neoplasm of large intestine: Secondary | ICD-10-CM | POA: Diagnosis not present

## 2016-10-05 DIAGNOSIS — R1310 Dysphagia, unspecified: Secondary | ICD-10-CM

## 2016-10-05 MED ORDER — PEG 3350-KCL-NA BICARB-NACL 420 G PO SOLR: 4000.0000 mL | ORAL | 0 refills | Status: DC

## 2016-10-05 NOTE — Patient Instructions (Signed)
Schedule a EGD with ED for dysphagia and early satiety and diagnostic colonoscopy - IDA and hx of colon cancer.  Propofol  Hold Lovenox the evening before procedure.

## 2016-10-05 NOTE — Progress Notes (Signed)
Primary Care Physician:  [name], MD Primary Gastroenterologist:  Dr. [name]  Pre-Procedure History & Physical: HPI:  [name] is a 66 y.o. male here for follow-up history of stage III colon cancer, history of Barrett's esophagus and dysphagia. Status post partial gastrectomy for benign gastric tumor many, many years ago. Patient continues to have iron deficiency and has been been receiving iron infusions through the oncology center.  He underwent empiric dilation of a normal aperture esophagus about a year and a half ago. Dysphagia resolved until the past couple months when he's had recurrence and difficulty swallowing. A switch from the Dexilant to  Prevacid 30 mg daily did not make much difference. Although his reflux symptoms are well controlled. He does have some anorexia and early satiety; his weight is stable compared compared with last office visit. He has chronic intermittent diarrhea  -  may have a day without a bowel movement and the next day he may have 4 loose stools. No blood or melena. He's had recurrent issues with abdominal pain and partial small bowel obstructions necessitating multiple hospitalizations and ED visits  -   2 earlier this year.  He takes Lovenox daily at bedtime given history of pulmonary emboli.  Past Medical History:  Diagnosis Date  . 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 Anemia   . Anxiety   . Arthritis   . Barrett's esophagus   . Blood transfusion   . Bowel obstruction ([place]) 05/13/2012   Recurrent  . Bronchitis   . Chest pain at rest   . Chronic abdominal pain   . Erosive esophagitis   . ETOH abuse    quit 03/2010  . GERD (gastroesophageal reflux disease)   . Hx of Clostridium difficile infection 01/2012  . Hypertension   . Ileus ([place])   . Iron deficiency anemia 03/23/2016  . Obstruction of bowel ([place]) 03/03/14  . Osteoporosis   . Personal history of PE (pulmonary embolism) 10/01/2010  . Pneumonia   .  Pulmonary embolism ([place]) 02/2010  . Recurrent upper respiratory infection (URI)   . S/P endoscopy September 28, 2010   erosive reflux esophagitis, Billroth I anatomy  . 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 Seizures ([place])   . Shortness of breath   . Sleep apnea   . TIA (transient ischemic attack) 10/11  . Vitamin B12 deficiency     Past Surgical History:  Procedure Laterality Date  . ABDOMINAL ADHESION SURGERY  03/04/15   @ [facility]  . ABDOMINAL EXPLORATION SURGERY    . abdominal sugery     for bowel obstruction x 8, all in 1980s, except for one in 07/2010  . APPENDECTOMY  1980s  . Billroth 1 hemigastrectomy  1980s   per patient for benign duodenal tumor  . CARDIAC CATHETERIZATION  07/17/2012  . CHOLECYSTECTOMY  1980s  . COLON SURGERY  May 2012   left hemicolectomy, colon cancer found at time of surgery for bowel obstruction  . COLONOSCOPY  03/18/2011   anastomosis at 35cm. Several adenomatous polyps removed. Sigmoid diverticulosis. Next TCS 02/2013  . COLONOSCOPY N/A 07/24/2012   POE:UMPNTI post segmental resection with normal-appearing colonic anastomosis aside from an adjacent polyp-removed as described above. Rectal polyp-removed as described above. CT findings appear to have been artifactual. tubular adenomas/prolapsed type polyp.  . COLONOSCOPY N/A 05/15/2015   Procedure: COLONOSCOPY;  Surgeon: [name], MD;  [facility]: [facility];  Service: Endoscopy;  Laterality: N/A;  .  ESOPHAGOGASTRODUODENOSCOPY  09/28/2010  . ESOPHAGOGASTRODUODENOSCOPY  12/01/2010   Cervical web status post dilation, erosive esophagitis, B1 hemigastrectomy, inflamed anastomosis  . ESOPHAGOGASTRODUODENOSCOPY  04/16/2011   excoriation at GEJ c/w trauma/M-W tear, friable gastric anastomosis, dilation efferent limb  . ESOPHAGOGASTRODUODENOSCOPY N/A 06/03/2014   Dr.Miquan Tandon- cervcal esopphageal web s/p dilation. abnormal distal esophagus bx= barretts esophagus  . ESOPHAGOGASTRODUODENOSCOPY N/A 05/15/2015   Procedure:  ESOPHAGOGASTRODUODENOSCOPY (EGD);  Surgeon: [name], MD;  [facility]: [facility];  Service: Endoscopy;  Laterality: N/A;  230  . ESOPHAGOGASTRODUODENOSCOPY (EGD) WITH ESOPHAGEAL DILATION  02/25/2012   WVP:XTGGYIRS esophageal web-s/p dilation anddisruption as described above. Status post prior gastric with Billroth I configuration. Abnormal gastric mucosa at the anastomosis. Gastric biopsy showed mild chronic inflammation but no H. pylori   . HERNIA REPAIR     right inguinal  . MALONEY DILATION N/A 06/03/2014   Procedure: [name] DILATION;  Surgeon: [name], MD;  [facility];  Service: Endoscopy;  Laterality: N/A;  [name] DILATION N/A 05/15/2015   Procedure: [name] DILATION;  Surgeon: [name], MD;  [facility];  Service: Endoscopy;  Laterality: N/A;  . PORTACATH PLACEMENT    . SAVORY DILATION N/A 06/03/2014   Procedure: SAVORY DILATION;  Surgeon: [name], MD;  [facility];  Service: Endoscopy;  Laterality: N/A;    Prior to Admission medications   Medication Sig Start Date End Date Taking? Authorizing Provider  acetaminophen (TYLENOL) 500 MG tablet Take 500 mg by mouth every 6 (six) hours as needed.   Yes [provider]  acidophilus (RISAQUAD) CAPS capsule Take 1 capsule by mouth daily.   Yes [provider]  albuterol (PROAIR HFA) 108 (90 BASE) MCG/ACT inhaler Inhale 1-2 puffs into the lungs every 6 (six) hours as needed for wheezing or shortness of breath.    Yes [provider]  atorvastatin (LIPITOR) 20 MG tablet Take 20 mg by mouth at bedtime.    Yes [provider]  bisacodyl (DULCOLAX) 10 MG suppository Place 1 suppository (10 mg total) rectally daily as needed for moderate constipation. 09/19/16  Yes Kathie Dike, MD  carvedilol (COREG) 6.25 MG tablet Take 6.25 mg by mouth 2 (two) times daily with a meal.   Yes [provider]  cholecalciferol (VITAMIN D) 1000 UNITS tablet Take  1,000 Units by mouth daily.    Yes [provider]  Copper Gluconate 2 MG CAPS Take 1 capsule by mouth daily. 05/03/16  Yes Kefalas, Manon Hilding, PA-C  cyanocobalamin (,VITAMIN B-12,) 1000 MCG/ML injection Inject 1,000 mcg into the muscle every 30 (thirty) days.   Yes [provider]  docusate sodium (COLACE) 100 MG capsule Take 100 mg by mouth daily as needed.    Yes [provider]  enoxaparin (LOVENOX) 60 MG/0.6ML injection Inject 60 mg into the skin at bedtime.    Yes [provider]  ferrous sulfate 325 (65 FE) MG tablet Take 1 tablet (325 mg total) by mouth 2 (two) times daily with a meal. 03/21/16  Yes Thurnell Lose, MD  lansoprazole (PREVACID) 30 MG capsule Take 1 capsule (30 mg total) by mouth daily at 12 noon. 08/11/16  Yes Carlis Stable, NP  levETIRAcetam (KEPPRA) 750 MG tablet Take 750 mg by mouth 2 (two) times daily.   Yes [provider]  lidocaine-prilocaine (EMLA) cream Apply 1 application topically as needed (prior to accessing port).   Yes [provider]  LORazepam (ATIVAN) 1  MG tablet Take 1 tablet (1 mg total) by mouth every 8 (eight) hours as needed for anxiety. 05/23/16  Yes Elgergawy, Silver Huguenin, MD  magnesium oxide (MAGNESIUM-OXIDE) 400 (241.3 Mg) MG tablet Take 400 mg by mouth 2 (two) times daily.   Yes [provider]  Multiple Vitamin (MULTIVITAMIN WITH MINERALS) TABS tablet Take 1 tablet by mouth daily.   Yes [provider]  Nutritional Supplements (ENSURE PLUS HN) LIQD Take 1 Bottle by mouth every other day.   Yes [provider]  polyethylene glycol (MIRALAX / GLYCOLAX) packet Take 17 g by mouth daily. 09/19/16  Yes Kathie Dike, MD  prochlorperazine (COMPAZINE) 10 MG tablet Take 10 mg by mouth every 6 (six) hours as needed for nausea or vomiting.    Yes [provider]  promethazine (PHENERGAN) 12.5 MG tablet Take 1 tablet (12.5 mg total) by mouth every 6 (six) hours as needed for  nausea or vomiting. 09/19/16  Yes Kathie Dike, MD  sucralfate (CARAFATE) 1 GM/10ML suspension Take 10 mLs (1 g total) by mouth 4 (four) times daily. 08/11/16  Yes Carlis Stable, NP  tamsulosin (FLOMAX) 0.4 MG CAPS capsule Take 0.4 mg by mouth at bedtime.    Yes [provider]  Vitamin D, Ergocalciferol, (DRISDOL) 50000 units CAPS capsule Take 50,000 Units by mouth every Sunday.   Yes [provider]  zoledronic acid (RECLAST) 5 MG/100ML SOLN injection Inject 5 mg into the vein See admin instructions. Pt gets a dose yearly.   Yes [provider]    Allergies as of 10/05/2016  . (No Known Allergies)    Family History  Problem Relation Age of Onset  . Hypertension Mother   . Arthritis Mother   . Pneumonia Mother   . Hypertension Father   . Heart attack Father   . Colon cancer Neg Hx     Social History   Social History  . Marital status: Married    Spouse name: N/A  . Number of children: 3  . Years of education: N/A   Occupational History  .  [employer]   Social History Main Topics  . Smoking status: Former Smoker    Packs/day: 0.50    Years: 40.00    Types: Cigarettes    Quit date: 12/20/2012  . Smokeless tobacco: Never Used  . Alcohol use No  . Drug use: No  . Sexual activity: No   Other Topics Concern  . Not on file   Social History Narrative  . No narrative on file    Review of Systems: See HPI, otherwise negative ROS  Physical Exam: BP (!) 149/93   Pulse 82   Temp 97.9 F (36.6 C) (Oral)   Ht 5\' 9"  (1.753 m)   Wt 155 lb 12.8 oz (70.7 kg)   BMI 23.01 kg/m  General:   pleasant and cooperative in NAD Skin:  Intact without significant lesions or rashes. Eyes:  Sclera clear, no icterus.   Conjunctiva pink. Ears:  Normal auditory acuity. Nose:  No deformity, discharge,  or lesions. Mouth:  No deformity or lesions. Neck:  Supple; no masses or thyromegaly. No significant cervical adenopathy. Lungs:  Clear throughout to  auscultation.   No wheezes, crackles, or rhonchi. No acute distress. Heart:  Regular rate and rhythm; no murmurs, clicks, rubs,  or gallops. Abdomen: Non-distended, well-healed midline laparotomy scar positive bowel sounds soft mild diffuse tenderness to palpation appreciable mass or organomegaly.   Pulses:  Normal pulses noted.  Extremities:  Without clubbing or edema.  Impression:  Pleasant 66 year old gentleman with long-standing GERD with recurrent esophageal dysphagia and history of Barrett's esophagus. Reflux symptoms well controlled. History of partial gastric resection for benign tumor many years ago. Superimposed, history of stage III colon cancer status post subtotal colectomy. From an oncology standpoint, doing well without recurrent disease. Iron deficiency anemia is concerned this may be in part related to malabsorption.  Reported recurrent bouts of abdominal pain and small bowel obstruction necessitating urgent evaluation likely due to adhesive disease. Certainly, no evidence of recurrent tumor on the last cross-sectional imaging study.  Recommendations:  Schedule a EGD with ED for dysphagia and early satiety and diagnostic colonoscopy with Propofol for IDA and hx of colon cancer.  Hold Lovenox the evening before procedure.  The risks, benefits, limitations, imponderables and alternatives regarding both EGD and colonoscopy have been reviewed with the patient. Questions have been answered. All parties agreeable.    Depending on findings, may recommend a CTE to evaluate small bowel for further.   Further recommendations to follow.                            Notice: This dictation was prepared with Dragon dictation along with smaller phrase technology. Any transcriptional errors that result from this process are unintentional and may not be corrected upon review.

## 2016-10-16 ENCOUNTER — Encounter (HOSPITAL_COMMUNITY): Payer: Self-pay | Admitting: Emergency Medicine

## 2016-10-16 ENCOUNTER — Emergency Department (HOSPITAL_COMMUNITY)
Admission: EM | Admit: 2016-10-16 | Discharge: 2016-10-16 | Disposition: A | Discharge status: Home / Self Care | Payer: Medicare Other | Attending: Emergency Medicine | Admitting: Emergency Medicine

## 2016-10-16 ENCOUNTER — Emergency Department (HOSPITAL_COMMUNITY): Payer: Medicare Other

## 2016-10-16 DIAGNOSIS — N183 Chronic kidney disease, stage 3 (moderate): Secondary | ICD-10-CM | POA: Insufficient documentation

## 2016-10-16 DIAGNOSIS — R079 Chest pain, unspecified: Secondary | ICD-10-CM | POA: Diagnosis present

## 2016-10-16 DIAGNOSIS — K2100 Gastro-esophageal reflux disease with esophagitis, without bleeding: Secondary | ICD-10-CM

## 2016-10-16 DIAGNOSIS — Z87891 Personal history of nicotine dependence: Secondary | ICD-10-CM | POA: Insufficient documentation

## 2016-10-16 DIAGNOSIS — E1122 Type 2 diabetes mellitus with diabetic chronic kidney disease: Secondary | ICD-10-CM | POA: Insufficient documentation

## 2016-10-16 DIAGNOSIS — R0602 Shortness of breath: Secondary | ICD-10-CM | POA: Diagnosis not present

## 2016-10-16 DIAGNOSIS — Z79899 Other long term (current) drug therapy: Secondary | ICD-10-CM | POA: Insufficient documentation

## 2016-10-16 LAB — D-DIMER, QUANTITATIVE (NOT AT ARMC)

## 2016-10-16 LAB — BASIC METABOLIC PANEL
Anion gap: 10 (ref 5–15)
BUN: 12 mg/dL (ref 6–20)
CALCIUM: 9.4 mg/dL (ref 8.9–10.3)
CHLORIDE: 104 mmol/L (ref 101–111)
CO2: 27 mmol/L (ref 22–32)
CREATININE: 1.5 mg/dL — AB (ref 0.61–1.24)
GFR calc non Af Amer: 47 mL/min — ABNORMAL LOW (ref >60)
GFR, EST AFRICAN AMERICAN: 55 mL/min — AB (ref >60)
Glucose, Bld: 110 mg/dL — ABNORMAL HIGH (ref 65–99)
Potassium: 4.4 mmol/L (ref 3.5–5.1)
SODIUM: 141 mmol/L (ref 135–145)

## 2016-10-16 LAB — CBC
HCT: 45.8 % (ref 39.0–52.0)
Hemoglobin: 15.4 g/dL (ref 13.0–17.0)
MCH: 31.1 pg (ref 26.0–34.0)
MCHC: 33.6 g/dL (ref 30.0–36.0)
MCV: 92.5 fL (ref 78.0–100.0)
PLATELETS: 164 10*3/uL (ref 150–400)
RBC: 4.95 MIL/uL (ref 4.22–5.81)
RDW: 13.6 % (ref 11.5–15.5)
WBC: 10.4 10*3/uL (ref 4.0–10.5)

## 2016-10-16 LAB — BRAIN NATRIURETIC PEPTIDE: B NATRIURETIC PEPTIDE 5: 33 pg/mL (ref 0.0–100.0)

## 2016-10-16 LAB — TROPONIN I

## 2016-10-16 MED ORDER — ONDANSETRON HCL 4 MG/2ML IJ SOLN: 4.0000 mg | Freq: Once | INTRAMUSCULAR | Status: DC

## 2016-10-16 MED ORDER — GI COCKTAIL ~~LOC~~
30.0000 mL | Freq: Once | ORAL | Status: AC
  Administered 2016-10-16: 30 mL via ORAL
  Filled 2016-10-16: qty 30

## 2016-10-16 MED ORDER — ONDANSETRON 4 MG PO TBDP
4.0000 mg | ORAL_TABLET | Freq: Once | ORAL | Status: AC
  Administered 2016-10-16: 4 mg via ORAL
  Filled 2016-10-16: qty 1

## 2016-10-16 NOTE — ED Triage Notes (Signed)
Pt reports shortness of breath and chest pain since 1pm today.  States he has had a 5lb weight gain in the past week with 2 lbs overnight.  Feels tight in abdomen.

## 2016-10-16 NOTE — Discharge Instructions (Signed)
Continue taking your Carafate. And follow-up with your family doctor this week

## 2016-10-16 NOTE — ED Provider Notes (Signed)
[facility] DEPT Provider Note   CSN: [id number] Arrival date & time: 10/16/16  1702     History   Chief Complaint Chief Complaint  Patient presents with  . Chest Pain    HPI [name] is a 66 y.o. male.  Patient complains of epigastric discomfort. Mild shortness of breath   The history is provided by the patient. No language interpreter was used.  Chest Pain   This is a new problem. The problem occurs constantly. The problem has not changed since onset.The pain is associated with movement. The pain is present in the epigastric region. The pain is at a severity of 4/10. The pain is mild. The quality of the pain is described as burning. The pain does not radiate. Associated symptoms include abdominal pain. Pertinent negatives include no back pain, no cough and no headaches.  Pertinent negatives for past medical history include no seizures.    Past Medical History:  Diagnosis Date  . 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 Anemia   . Anxiety   . Arthritis   . Barrett's esophagus   . Blood transfusion   . Bowel obstruction ([place]) 05/13/2012   Recurrent  . Bronchitis   . Chest pain at rest   . Chronic abdominal pain   . Erosive esophagitis   . ETOH abuse    quit 03/2010  . GERD (gastroesophageal reflux disease)   . Hx of Clostridium difficile infection 01/2012  . Hypertension   . Ileus ([place])   . Iron deficiency anemia 03/23/2016  . Obstruction of bowel ([place]) 03/03/14  . Osteoporosis   . Personal history of PE (pulmonary embolism) 10/01/2010  . Pneumonia   . Pulmonary embolism ([place]) 02/2010  . Recurrent upper respiratory infection (URI)   . S/P endoscopy September 28, 2010   erosive reflux esophagitis, Billroth I anatomy  . 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 Seizures ([place])   . Shortness of breath   . Sleep apnea   . TIA (transient ischemic attack) 10/11  . Vitamin B12 deficiency     Patient Active Problem List   Diagnosis Date Noted    . Seizure disorder ([place]) 09/16/2016  . CKD (chronic kidney disease) stage 3, GFR 30-59 ml/min 06/14/2016  . Patient has nasogastric tube   . Ileus ([place]) 05/15/2016  . Chronic diastolic heart failure ([place]) 05/15/2016  . Iron deficiency anemia 03/23/2016  . Acute renal failure ([place]) 03/19/2016  . Normocytic anemia 03/19/2016  . Hypotension 03/19/2016  . Anemia   . History of colon cancer, stage III   . Hx of colon cancer, stage III   . History of colonic polyps   . Barrett's esophagus without dysplasia   . Dysphagia   . Essential hypertension 11/30/2014  . Dysphagia, pharyngoesophageal phase   . Mucosal abnormality of esophagus   . SBO (small bowel obstruction) ([place]) 09/17/2013  . Rectal bleeding 07/11/2012  . Bowel obstruction ([place]) 07/11/2012  . Abnormal CT scan, colon 07/11/2012  . Esophageal dysphagia 02/22/2012  . H/O Clostridium difficile infection 02/22/2012  . Diarrhea 09/10/2011  . Cellulitis 09/10/2011  . Nausea 06/07/2011  . Abdominal distention 06/07/2011  . HTN (hypertension) 06/07/2011  . Partial small bowel obstruction ([place]) 05/31/2011  . GI bleed 05/27/2011  . Coagulopathy ([place]) 05/27/2011  . Gastroenteritis 05/27/2011  . Small bowel obstruction ([place]) 05/13/2011  . Left sided abdominal pain 05/13/2011  . Pulmonary embolism ([place]) 01/17/2011  . Chest pain at rest 01/17/2011  .  Pneumonia 12/11/2010  . Hematemesis 12/05/2010  . Chronic abdominal pain 12/05/2010  . Pancytopenia due to antineoplastic chemotherapy ([place]) 12/05/2010  . Coffee ground emesis 11/29/2010  . Esophageal reflux disease 11/15/2010  . 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 Personal history of PE (pulmonary embolism) 10/01/2010  . Bronchitis, acute 10/01/2010  . Erosive esophagitis 10/01/2010  . Fever chills 09/29/2010  . Bleeding gastrointestinal 09/26/2010  . Supratherapeutic INR 09/26/2010  . Abdominal pain 09/26/2010  . Nausea and vomiting 09/26/2010  . Anemia, chronic disease  09/26/2010  . Adenocarcinoma of colon with mucinous features 09/22/2010    Past Surgical History:  Procedure Laterality Date  . ABDOMINAL ADHESION SURGERY  03/04/15   @ [facility]  . ABDOMINAL EXPLORATION SURGERY    . abdominal sugery     for bowel obstruction x 8, all in 1980s, except for one in 07/2010  . APPENDECTOMY  1980s  . Billroth 1 hemigastrectomy  1980s   per patient for benign duodenal tumor  . CARDIAC CATHETERIZATION  07/17/2012  . CHOLECYSTECTOMY  1980s  . COLON SURGERY  May 2012   left hemicolectomy, colon cancer found at time of surgery for bowel obstruction  . COLONOSCOPY  03/18/2011   anastomosis at 35cm. Several adenomatous polyps removed. Sigmoid diverticulosis. Next TCS 02/2013  . COLONOSCOPY N/A 07/24/2012   [id number] post segmental resection with normal-appearing colonic anastomosis aside from an adjacent polyp-removed as described above. Rectal polyp-removed as described above. CT findings appear to have been artifactual. tubular adenomas/prolapsed type polyp.  . COLONOSCOPY N/A 05/15/2015   Procedure: COLONOSCOPY;  Surgeon: [name], MD;  Location: [facility];  Service: Endoscopy;  Laterality: N/A;  . ESOPHAGOGASTRODUODENOSCOPY  09/28/2010  . ESOPHAGOGASTRODUODENOSCOPY  12/01/2010   Cervical web status post dilation, erosive esophagitis, B1 hemigastrectomy, inflamed anastomosis  . ESOPHAGOGASTRODUODENOSCOPY  04/16/2011   excoriation at GEJ c/w trauma/M-W tear, friable gastric anastomosis, dilation efferent limb  . ESOPHAGOGASTRODUODENOSCOPY N/A 06/03/2014   Dr.[name]- cervcal esopphageal web s/p dilation. abnormal distal esophagus bx= barretts esophagus  . ESOPHAGOGASTRODUODENOSCOPY N/A 05/15/2015   Procedure: ESOPHAGOGASTRODUODENOSCOPY (EGD);  Surgeon: [name], MD;  Location: [facility];  Service: Endoscopy;  Laterality: N/A;  230  . ESOPHAGOGASTRODUODENOSCOPY (EGD) WITH ESOPHAGEAL DILATION  02/25/2012   [id number] esophageal web-s/p dilation  anddisruption as described above. Status post prior gastric with Billroth I configuration. Abnormal gastric mucosa at the anastomosis. Gastric biopsy showed mild chronic inflammation but no H. pylori   . HERNIA REPAIR     right inguinal  . MALONEY DILATION N/A 06/03/2014   Procedure: [name] DILATION;  Surgeon: [name], MD;  Location: [facility];  Service: Endoscopy;  Laterality: N/A;  [name] DILATION N/A 05/15/2015   Procedure: [name] DILATION;  Surgeon: [name], MD;  Location: [facility];  Service: Endoscopy;  Laterality: N/A;  . PORTACATH PLACEMENT    . SAVORY DILATION N/A 06/03/2014   Procedure: SAVORY DILATION;  Surgeon: [name], MD;  Location: [facility];  Service: Endoscopy;  Laterality: N/A;       Home Medications    Prior to Admission medications   Medication Sig Start Date End Date Taking? Authorizing Provider  acetaminophen (TYLENOL) 500 MG tablet Take 500 mg by mouth every 6 (six) hours as needed.   Yes [provider]  acidophilus (RISAQUAD) CAPS capsule Take 1 capsule by mouth daily.   Yes [provider]  albuterol (PROAIR HFA) 108 (90 BASE) MCG/ACT inhaler Inhale 1-2 puffs into the  lungs every 6 (six) hours as needed for wheezing or shortness of breath.    Yes [provider]  atorvastatin (LIPITOR) 20 MG tablet Take 20 mg by mouth at bedtime.    Yes [provider]  bisacodyl (DULCOLAX) 10 MG suppository Place 1 suppository (10 mg total) rectally daily as needed for moderate constipation. 09/19/16  Yes Kathie Dike, MD  carvedilol (COREG) 6.25 MG tablet Take 6.25 mg by mouth 2 (two) times daily with a meal.   Yes [provider]  cholecalciferol (VITAMIN D) 1000 UNITS tablet Take 1,000 Units by mouth daily.    Yes [provider]  Copper Gluconate 2 MG CAPS Take 1 capsule by mouth daily. 05/03/16  Yes Kefalas, Manon Hilding, PA-C  docusate sodium (COLACE) 100 MG capsule Take 100 mg by mouth  daily as needed.    Yes [provider]  enoxaparin (LOVENOX) 60 MG/0.6ML injection Inject 60 mg into the skin at bedtime.    Yes [provider]  ferrous sulfate 325 (65 FE) MG tablet Take 1 tablet (325 mg total) by mouth 2 (two) times daily with a meal. 03/21/16  Yes Thurnell Lose, MD  lansoprazole (PREVACID) 30 MG capsule Take 1 capsule (30 mg total) by mouth daily at 12 noon. 08/11/16  Yes Carlis Stable, NP  levETIRAcetam (KEPPRA) 750 MG tablet Take 750 mg by mouth 2 (two) times daily.   Yes [provider]  lidocaine-prilocaine (EMLA) cream Apply 1 application topically as needed (prior to accessing port).   Yes [provider]  LORazepam (ATIVAN) 1 MG tablet Take 1 tablet (1 mg total) by mouth every 8 (eight) hours as needed for anxiety. 05/23/16  Yes Elgergawy, Silver Huguenin, MD  magnesium oxide (MAGNESIUM-OXIDE) 400 (241.3 Mg) MG tablet Take 400 mg by mouth 2 (two) times daily.   Yes [provider]  Multiple Vitamin (MULTIVITAMIN WITH MINERALS) TABS tablet Take 1 tablet by mouth daily.   Yes [provider]  Nutritional Supplements (ENSURE PLUS HN) LIQD Take 1 Bottle by mouth every other day.   Yes [provider]  polyethylene glycol (MIRALAX / GLYCOLAX) packet Take 17 g by mouth daily. 09/19/16  Yes Kathie Dike, MD  promethazine (PHENERGAN) 12.5 MG tablet Take 1 tablet (12.5 mg total) by mouth every 6 (six) hours as needed for nausea or vomiting. 09/19/16  Yes Kathie Dike, MD  sucralfate (CARAFATE) 1 GM/10ML suspension Take 10 mLs (1 g total) by mouth 4 (four) times daily. 08/11/16  Yes Carlis Stable, NP  tamsulosin (FLOMAX) 0.4 MG CAPS capsule Take 0.4 mg by mouth at bedtime.    Yes [provider]  cyanocobalamin (,VITAMIN B-12,) 1000 MCG/ML injection Inject 1,000 mcg into the muscle every 30 (thirty) days.    [provider]  polyethylene glycol-electrolytes (TRILYTE) 420 g solution Take 4,000 mLs by mouth as  directed. Patient not taking: Reported on 10/16/2016 10/05/16   [name], MD  prochlorperazine (COMPAZINE) 10 MG tablet Take 10 mg by mouth every 6 (six) hours as needed for nausea or vomiting.     [provider]  Vitamin D, Ergocalciferol, (DRISDOL) 50000 units CAPS capsule Take 50,000 Units by mouth every Sunday.    [provider]  zoledronic acid (RECLAST) 5 MG/100ML SOLN injection Inject 5 mg into the vein See admin instructions. Pt gets a dose yearly.    [provider]    Family History Family History  Problem Relation Age of Onset  .  Hypertension Mother   . Arthritis Mother   . Pneumonia Mother   . Hypertension Father   . Heart attack Father   . Colon cancer Neg Hx     Social History Social History  Substance Use Topics  . Smoking status: Former Smoker    Packs/day: 0.50    Years: 40.00    Types: Cigarettes    Quit date: 12/20/2012  . Smokeless tobacco: Never Used  . Alcohol use No     Allergies   Patient has no known allergies.   Review of Systems Review of Systems  Constitutional: Negative for appetite change and fatigue.  HENT: Negative for congestion, ear discharge and sinus pressure.   Eyes: Negative for discharge.  Respiratory: Negative for cough.   Cardiovascular: Positive for chest pain.  Gastrointestinal: Positive for abdominal pain. Negative for diarrhea.  Genitourinary: Negative for frequency and hematuria.  Musculoskeletal: Negative for back pain.  Skin: Negative for rash.  Neurological: Negative for seizures and headaches.  Psychiatric/Behavioral: Negative for hallucinations.     Physical Exam Updated Vital Signs BP (!) 159/93   Pulse 65   Temp 98.1 F (36.7 C) (Oral)   Resp 18   Ht 5\' 9"  (1.753 m)   Wt 72.9 kg (160 lb 12.8 oz)   SpO2 98%   BMI 23.75 kg/m   Physical Exam  Constitutional: He is oriented to person, place, and time. He appears well-developed.  HENT:  Head: Normocephalic.  Eyes:  Conjunctivae and EOM are normal. No scleral icterus.  Neck: Neck supple. No thyromegaly present.  Cardiovascular: Normal rate and regular rhythm.  Exam reveals no gallop and no friction rub.   No murmur heard. Pulmonary/Chest: No stridor. He has no wheezes. He has no rales. He exhibits no tenderness.  Abdominal: He exhibits no distension. There is tenderness. There is no rebound.  Tender epigastric  Musculoskeletal: Normal range of motion. He exhibits no edema.  Lymphadenopathy:    He has no cervical adenopathy.  Neurological: He is oriented to person, place, and time. He exhibits normal muscle tone. Coordination normal.  Skin: No rash noted. No erythema.  Psychiatric: He has a normal mood and affect. His behavior is normal.     ED Treatments / Results  Labs (all labs ordered are listed, but only abnormal results are displayed) Labs Reviewed  BASIC METABOLIC PANEL - Abnormal; Notable for the following:       Result Value   Glucose, Bld 110 (*)    Creatinine, Ser 1.50 (*)    GFR calc non Af Amer 47 (*)    GFR calc Af Amer 55 (*)    All other components within normal limits  CBC  TROPONIN I  BRAIN NATRIURETIC PEPTIDE  D-DIMER, QUANTITATIVE (NOT AT [facility])    EKG  EKG Interpretation  Date/Time:  Saturday October 16 2016 17:46:22 EDT Ventricular Rate:  81 PR Interval:  138 QRS Duration: 86 QT Interval:  372 QTC Calculation: 432 R Axis:   -17 Text Interpretation:  Normal sinus rhythm Moderate voltage criteria for LVH, may be normal variant Nonspecific ST abnormality Abnormal ECG Confirmed by Milton Ferguson [phone]) on 10/16/2016 7:39:21 PM       Radiology Dg Chest 2 View  Result Date: 10/16/2016 CLINICAL DATA:  Chest pain and shortness of breath today. History of colon cancer and anemia. EXAM: CHEST  2 VIEW COMPARISON:  09/15/2016 FINDINGS: Power port type central venous catheter with tip over the mid SVC region. No pneumothorax. Slightly shallow inspiration  with linear  atelectasis or fibrosis in the lung bases, similar to prior study. Heart size and pulmonary vascularity are normal. No focal airspace disease or consolidation in the lungs. No blunting of costophrenic angles. No pneumothorax. Anterior compression of a midthoracic vertebra was present on the previous chest radiograph from 05/15/2016. Surgical clips in the upper abdomen. IMPRESSION: No evidence of active pulmonary disease. Slight fibrosis or atelectasis in the lung bases. Electronically Signed   By: Lucienne Capers M.D.   On: 10/16/2016 18:31    Procedures Procedures (including critical care time)  Medications Ordered in ED Medications  gi cocktail (Maalox,Lidocaine,Donnatal) (not administered)  ondansetron (ZOFRAN) injection 4 mg (not administered)     Initial Impression / Assessment and Plan / ED Course  I have reviewed the triage vital signs and the nursing notes.  Pertinent labs & imaging results that were available during my care of the patient were reviewed by me and considered in my medical decision making (see chart for details).     Suspect peptic ulcer problems. Patient will continue carafate and follow-up with PCP  Final Clinical Impressions(s) / ED Diagnoses   Final diagnoses:  None    New Prescriptions New Prescriptions   No medications on file     Milton Ferguson, MD 10/16/16 2145

## 2016-10-29 NOTE — Patient Instructions (Signed)
[name]  10/29/2016     @PREFPERIOPPHARMACY @   Your procedure is scheduled on 11/04/2016.  Report to [name] at 8:15 A.M.  Call this number if you have problems the morning of surgery:  [phone]   Remember:  Do not eat food or drink liquids after midnight.  Take these medicines the morning of surgery with A SIP OF WATER Albuterol inhaler and bring with you, Coreg, Keppra, Ativan, Compazine or Phenergan  If needed, Carafate, Flomax   Do not wear jewelry, make-up or nail polish.  Do not wear lotions, powders, or perfumes, or deoderant.  Do not shave 48 hours prior to surgery.  Men may shave face and neck.  Do not bring valuables to the hospital.  [facility] is not responsible for any belongings or valuables.  Contacts, dentures or bridgework may not be worn into surgery.  Leave your suitcase in the car.  After surgery it may be brought to your room.  For patients admitted to the hospital, discharge time will be determined by your treatment team.  Patients discharged the day of surgery will not be allowed to drive home.    Please read over the following fact sheets that you were given. Anesthesia Post-op Instructions     PATIENT INSTRUCTIONS POST-ANESTHESIA  IMMEDIATELY FOLLOWING SURGERY:  Do not drive or operate machinery for the first twenty four hours after surgery.  Do not make any important decisions for twenty four hours after surgery or while taking narcotic pain medications or sedatives.  If you develop intractable nausea and vomiting or a severe headache please notify your doctor immediately.  FOLLOW-UP:  Please make an appointment with your surgeon as instructed. You do not need to follow up with anesthesia unless specifically instructed to do so.  WOUND CARE INSTRUCTIONS (if applicable):  Keep a dry clean dressing on the anesthesia/puncture wound site if there is drainage.  Once the wound has quit draining you may leave it open to air.  Generally  you should leave the bandage intact for twenty four hours unless there is drainage.  If the epidural site drains for more than 36-48 hours please call the anesthesia department.  QUESTIONS?:  Please feel free to call your physician or the hospital operator if you have any questions, and they will be happy to assist you.      Colonoscopy, Adult A colonoscopy is an exam to look at the entire large intestine. During the exam, a lubricated, bendable tube is inserted into the anus and then passed into the rectum, colon, and other parts of the large intestine. A colonoscopy is often done as a part of normal colorectal screening or in response to certain symptoms, such as anemia, persistent diarrhea, abdominal pain, and blood in the stool. The exam can help screen for and diagnose medical problems, including:  Tumors.  Polyps.  Inflammation.  Areas of bleeding.  Tell a health care provider about:  Any allergies you have.  All medicines you are taking, including vitamins, herbs, eye drops, creams, and over-the-counter medicines.  Any problems you or family members have had with anesthetic medicines.  Any blood disorders you have.  Any surgeries you have had.  Any medical conditions you have.  Any problems you have had passing stool. What are the risks? Generally, this is a safe procedure. However, problems may occur, including:  Bleeding.  A tear in the intestine.  A reaction to medicines given during the exam.  Infection (rare).  What happens  before the procedure? Eating and drinking restrictions Follow instructions from your health care provider about eating and drinking, which may include:  A few days before the procedure - follow a low-fiber diet. Avoid nuts, seeds, dried fruit, raw fruits, and vegetables.  1-3 days before the procedure - follow a clear liquid diet. Drink only clear liquids, such as clear broth or bouillon, black coffee or tea, clear juice, clear soft  drinks or sports drinks, gelatin dessert, and popsicles. Avoid any liquids that contain red or purple dye.  On the day of the procedure - do not eat or drink anything during the 2 hours before the procedure, or within the time period that your health care provider recommends.  Bowel prep If you were prescribed an oral bowel prep to clean out your colon:  Take it as told by your health care provider. Starting the day before your procedure, you will need to drink a large amount of medicated liquid. The liquid will cause you to have multiple loose stools until your stool is almost clear or light green.  If your skin or anus gets irritated from diarrhea, you may use these to relieve the irritation: ? Medicated wipes, such as adult wet wipes with aloe and vitamin E. ? A skin soothing-product like petroleum jelly.  If you vomit while drinking the bowel prep, take a break for up to 60 minutes and then begin the bowel prep again. If vomiting continues and you cannot take the bowel prep without vomiting, call your health care provider.  General instructions  Ask your health care provider about changing or stopping your regular medicines. This is especially important if you are taking diabetes medicines or blood thinners.  Plan to have someone take you home from the hospital or clinic. What happens during the procedure?  An IV tube may be inserted into one of your veins.  You will be given medicine to help you relax (sedative).  To reduce your risk of infection: ? Your health care team will wash or sanitize their hands. ? Your anal area will be washed with soap.  You will be asked to lie on your side with your knees bent.  Your health care provider will lubricate a long, thin, flexible tube. The tube will have a camera and a light on the end.  The tube will be inserted into your anus.  The tube will be gently eased through your rectum and colon.  Air will be delivered into your colon to  keep it open. You may feel some pressure or cramping.  The camera will be used to take images during the procedure.  A small tissue sample may be removed from your body to be examined under a microscope (biopsy). If any potential problems are found, the tissue will be sent to a lab for testing.  If small polyps are found, your health care provider may remove them and have them checked for cancer cells.  The tube that was inserted into your anus will be slowly removed. The procedure may vary among health care providers and hospitals. What happens after the procedure?  Your blood pressure, heart rate, breathing rate, and blood oxygen level will be monitored until the medicines you were given have worn off.  Do not drive for 24 hours after the exam.  You may have a small amount of blood in your stool.  You may pass gas and have mild abdominal cramping or bloating due to the air that was used to inflate  your colon during the exam.  It is up to you to get the results of your procedure. Ask your health care provider, or the department performing the procedure, when your results will be ready. This information is not intended to replace advice given to you by your health care provider. Make sure you discuss any questions you have with your health care provider. Document Released: 03/05/2000 Document Revised: 01/07/2016 Document Reviewed: 05/20/2015 Elsevier Interactive Patient Education  2018 [employer].

## 2016-11-02 ENCOUNTER — Encounter (HOSPITAL_COMMUNITY): Payer: Self-pay

## 2016-11-02 ENCOUNTER — Encounter (HOSPITAL_COMMUNITY)
Admission: RE | Admit: 2016-11-02 | Discharge: 2016-11-02 | Disposition: A | Discharge status: Home / Self Care | Payer: Medicare Other | Source: Ambulatory Visit | Attending: Internal Medicine | Admitting: Internal Medicine

## 2016-11-04 ENCOUNTER — Encounter (HOSPITAL_COMMUNITY): Payer: Self-pay

## 2016-11-04 ENCOUNTER — Ambulatory Visit (HOSPITAL_COMMUNITY): Payer: Medicare Other | Admitting: Anesthesiology

## 2016-11-04 ENCOUNTER — Ambulatory Visit (HOSPITAL_COMMUNITY)
Admission: RE | Admit: 2016-11-04 | Discharge: 2016-11-04 | Disposition: A | Discharge status: Home / Self Care | Payer: Medicare Other | Source: Ambulatory Visit | Attending: Internal Medicine | Admitting: Internal Medicine

## 2016-11-04 ENCOUNTER — Encounter (HOSPITAL_COMMUNITY)
Admission: RE | Disposition: A | Discharge status: Home / Self Care | Payer: Self-pay | Source: Ambulatory Visit | Attending: Internal Medicine

## 2016-11-04 DIAGNOSIS — D509 Iron deficiency anemia, unspecified: Secondary | ICD-10-CM | POA: Diagnosis not present

## 2016-11-04 DIAGNOSIS — M810 Age-related osteoporosis without current pathological fracture: Secondary | ICD-10-CM | POA: Diagnosis not present

## 2016-11-04 DIAGNOSIS — Z9049 Acquired absence of other specified parts of digestive tract: Secondary | ICD-10-CM | POA: Diagnosis not present

## 2016-11-04 DIAGNOSIS — R1310 Dysphagia, unspecified: Secondary | ICD-10-CM | POA: Diagnosis not present

## 2016-11-04 DIAGNOSIS — Z1211 Encounter for screening for malignant neoplasm of colon: Secondary | ICD-10-CM | POA: Insufficient documentation

## 2016-11-04 DIAGNOSIS — E538 Deficiency of other specified B group vitamins: Secondary | ICD-10-CM | POA: Insufficient documentation

## 2016-11-04 DIAGNOSIS — I10 Essential (primary) hypertension: Secondary | ICD-10-CM | POA: Diagnosis not present

## 2016-11-04 DIAGNOSIS — K210 Gastro-esophageal reflux disease with esophagitis: Secondary | ICD-10-CM | POA: Diagnosis not present

## 2016-11-04 DIAGNOSIS — Z8673 Personal history of transient ischemic attack (TIA), and cerebral infarction without residual deficits: Secondary | ICD-10-CM | POA: Insufficient documentation

## 2016-11-04 DIAGNOSIS — K209 Esophagitis, unspecified: Secondary | ICD-10-CM | POA: Diagnosis not present

## 2016-11-04 DIAGNOSIS — F419 Anxiety disorder, unspecified: Secondary | ICD-10-CM | POA: Insufficient documentation

## 2016-11-04 DIAGNOSIS — Z86711 Personal history of pulmonary embolism: Secondary | ICD-10-CM | POA: Diagnosis not present

## 2016-11-04 DIAGNOSIS — K2270 Barrett's esophagus without dysplasia: Secondary | ICD-10-CM | POA: Insufficient documentation

## 2016-11-04 DIAGNOSIS — G4730 Sleep apnea, unspecified: Secondary | ICD-10-CM | POA: Diagnosis not present

## 2016-11-04 DIAGNOSIS — Z85038 Personal history of other malignant neoplasm of large intestine: Secondary | ICD-10-CM | POA: Diagnosis not present

## 2016-11-04 DIAGNOSIS — Z7901 Long term (current) use of anticoagulants: Secondary | ICD-10-CM | POA: Insufficient documentation

## 2016-11-04 DIAGNOSIS — K449 Diaphragmatic hernia without obstruction or gangrene: Secondary | ICD-10-CM | POA: Insufficient documentation

## 2016-11-04 DIAGNOSIS — M1990 Unspecified osteoarthritis, unspecified site: Secondary | ICD-10-CM | POA: Insufficient documentation

## 2016-11-04 DIAGNOSIS — Z79899 Other long term (current) drug therapy: Secondary | ICD-10-CM | POA: Insufficient documentation

## 2016-11-04 DIAGNOSIS — Z87891 Personal history of nicotine dependence: Secondary | ICD-10-CM | POA: Insufficient documentation

## 2016-11-04 DIAGNOSIS — Z903 Acquired absence of stomach [part of]: Secondary | ICD-10-CM | POA: Diagnosis not present

## 2016-11-04 HISTORY — PX: COLONOSCOPY WITH PROPOFOL: SHX5780

## 2016-11-04 HISTORY — PX: ESOPHAGOGASTRODUODENOSCOPY (EGD) WITH PROPOFOL: SHX5813

## 2016-11-04 HISTORY — PX: MALONEY DILATION: SHX5535

## 2016-11-04 SURGERY — COLONOSCOPY WITH PROPOFOL
Anesthesia: Monitor Anesthesia Care

## 2016-11-04 MED ORDER — CHLORHEXIDINE GLUCONATE CLOTH 2 % EX PADS: 6.0000 | MEDICATED_PAD | Freq: Once | CUTANEOUS | Status: DC

## 2016-11-04 MED ORDER — PROPOFOL 500 MG/50ML IV EMUL
INTRAVENOUS | Status: DC | PRN
  Administered 2016-11-04: 100 ug/kg/min via INTRAVENOUS

## 2016-11-04 MED ORDER — MIDAZOLAM HCL 2 MG/2ML IJ SOLN
1.0000 mg | INTRAMUSCULAR | Status: AC
  Administered 2016-11-04: 2 mg via INTRAVENOUS

## 2016-11-04 MED ORDER — FENTANYL CITRATE (PF) 100 MCG/2ML IJ SOLN
INTRAMUSCULAR | Status: AC
  Filled 2016-11-04: qty 2

## 2016-11-04 MED ORDER — MIDAZOLAM HCL 5 MG/5ML IJ SOLN
INTRAMUSCULAR | Status: DC | PRN
  Administered 2016-11-04: 2 mg via INTRAVENOUS

## 2016-11-04 MED ORDER — LACTATED RINGERS IV SOLN
INTRAVENOUS | Status: DC
  Administered 2016-11-04: 09:00:00 via INTRAVENOUS

## 2016-11-04 MED ORDER — MIDAZOLAM HCL 2 MG/2ML IJ SOLN
INTRAMUSCULAR | Status: AC
  Filled 2016-11-04: qty 2

## 2016-11-04 MED ORDER — FENTANYL CITRATE (PF) 100 MCG/2ML IJ SOLN
25.0000 ug | Freq: Once | INTRAMUSCULAR | Status: AC
  Administered 2016-11-04: 25 ug via INTRAVENOUS

## 2016-11-04 MED ORDER — LIDOCAINE VISCOUS 2 % MT SOLN
OROMUCOSAL | Status: AC
Start: 2016-11-04 — End: 2016-11-04
  Filled 2016-11-04: qty 15

## 2016-11-04 MED ORDER — LIDOCAINE VISCOUS 2 % MT SOLN
15.0000 mL | Freq: Once | OROMUCOSAL | Status: AC
  Administered 2016-11-04: 15 mL via OROMUCOSAL

## 2016-11-04 MED ORDER — MIDAZOLAM HCL 2 MG/2ML IJ SOLN
INTRAMUSCULAR | Status: AC
  Filled 2016-11-04: qty 4

## 2016-11-04 NOTE — Discharge Instructions (Addendum)
EGD Discharge instructions Please read the instructions outlined below and refer to this sheet in the next few weeks. These discharge instructions provide you with general information on caring for yourself after you leave the hospital. Your doctor may also give you specific instructions. While your treatment has been planned according to the most current medical practices available, unavoidable complications occasionally occur. If you have any problems or questions after discharge, please call your doctor. ACTIVITY  You may resume your regular activity but move at a slower pace for the next 24 hours.   Take frequent rest periods for the next 24 hours.   Walking will help expel (get rid of) the air and reduce the bloated feeling in your abdomen.   No driving for 24 hours (because of the anesthesia (medicine) used during the test).   You may shower.   Do not sign any important legal documents or operate any machinery for 24 hours (because of the anesthesia used during the test).  NUTRITION  Drink plenty of fluids.   You may resume your normal diet.   Begin with a light meal and progress to your normal diet.   Avoid alcoholic beverages for 24 hours or as instructed by your caregiver.  MEDICATIONS  You may resume your normal medications unless your caregiver tells you otherwise.  WHAT YOU CAN EXPECT TODAY  You may experience abdominal discomfort such as a feeling of fullness or gas pains.  FOLLOW-UP  Your doctor will discuss the results of your test with you.  SEEK IMMEDIATE MEDICAL ATTENTION IF ANY OF THE FOLLOWING OCCUR:  Excessive nausea (feeling sick to your stomach) and/or vomiting.   Severe abdominal pain and distention (swelling).   Trouble swallowing.   Temperature over 101 F (37.8 C).   Rectal bleeding or vomiting of blood.   Colonoscopy Discharge Instructions  Read the instructions outlined below and refer to this sheet in the next few weeks. These  discharge instructions provide you with general information on caring for yourself after you leave the hospital. Your doctor may also give you specific instructions. While your treatment has been planned according to the most current medical practices available, unavoidable complications occasionally occur. If you have any problems or questions after discharge, call Dr. [name] at [phone]. ACTIVITY  You may resume your regular activity, but move at a slower pace for the next 24 hours.   Take frequent rest periods for the next 24 hours.   Walking will help get rid of the air and reduce the bloated feeling in your belly (abdomen).   No driving for 24 hours (because of the medicine (anesthesia) used during the test).    Do not sign any important legal documents or operate any machinery for 24 hours (because of the anesthesia used during the test).  NUTRITION  Drink plenty of fluids.   You may resume your normal diet as instructed by your doctor.   Begin with a light meal and progress to your normal diet. Heavy or fried foods are harder to digest and may make you feel sick to your stomach (nauseated).   Avoid alcoholic beverages for 24 hours or as instructed.  MEDICATIONS  You may resume your normal medications unless your doctor tells you otherwise.  WHAT YOU CAN EXPECT TODAY  Some feelings of bloating in the abdomen.   Passage of more gas than usual.   Spotting of blood in your stool or on the toilet paper.  IF YOU HAD POLYPS REMOVED DURING THE COLONOSCOPY:  No aspirin products for 7 days or as instructed.   No alcohol for 7 days or as instructed.   Eat a soft diet for the next 24 hours.  FINDING OUT THE RESULTS OF YOUR TEST Not all test results are available during your visit. If your test results are not back during the visit, make an appointment with your caregiver to find out the results. Do not assume everything is normal if you have not heard from your caregiver or the  medical facility. It is important for you to follow up on all of your test results.  SEEK IMMEDIATE MEDICAL ATTENTION IF:  You have more than a spotting of blood in your stool.   Your belly is swollen (abdominal distention).   You are nauseated or vomiting.   You have a temperature over 101.   You have abdominal pain or discomfort that is severe or gets worse throughout the day.    Increase Prevacid 30 mg twice daily  GERD information provided  Office visit with [place] in 3 months  Further recommendations to follow pending review of pathology report from biopsies of esophagus  Repeat colonoscopy in 5 years.  Gastroesophageal Reflux Disease, Adult Normally, food travels down the esophagus and stays in the stomach to be digested. However, when a person has gastroesophageal reflux disease (GERD), food and stomach acid move back up into the esophagus. When this happens, the esophagus becomes sore and inflamed. Over time, GERD can create small holes (ulcers) in the lining of the esophagus. What are the causes? This condition is caused by a problem with the muscle between the esophagus and the stomach (lower esophageal sphincter, or LES). Normally, the LES muscle closes after food passes through the esophagus to the stomach. When the LES is weakened or abnormal, it does not close properly, and that allows food and stomach acid to go back up into the esophagus. The LES can be weakened by certain dietary substances, medicines, and medical conditions, including:  Tobacco use.  Pregnancy.  Having a hiatal hernia.  Heavy alcohol use.  Certain foods and beverages, such as coffee, chocolate, onions, and peppermint.  What increases the risk? This condition is more likely to develop in:  People who have an increased body weight.  People who have connective tissue disorders.  People who use NSAID medicines.  What are the signs or symptoms? Symptoms of this condition  include:  Heartburn.  Difficult or painful swallowing.  The feeling of having a lump in the throat.  Abitter taste in the mouth.  Bad breath.  Having a large amount of saliva.  Having an upset or bloated stomach.  Belching.  Chest pain.  Shortness of breath or wheezing.  Ongoing (chronic) cough or a night-time cough.  Wearing away of tooth enamel.  Weight loss.  Different conditions can cause chest pain. Make sure to see your health care provider if you experience chest pain. How is this diagnosed? Your health care provider will take a medical history and perform a physical exam. To determine if you have mild or severe GERD, your health care provider may also monitor how you respond to treatment. You may also have other tests, including:  An endoscopy toexamine your stomach and esophagus with a small camera.  A test thatmeasures the acidity level in your esophagus.  A test thatmeasures how much pressure is on your esophagus.  A barium swallow or modified barium swallow to show the shape, size, and functioning of your esophagus.  How  is this treated? The goal of treatment is to help relieve your symptoms and to prevent complications. Treatment for this condition may vary depending on how severe your symptoms are. Your health care provider may recommend:  Changes to your diet.  Medicine.  Surgery.  Follow these instructions at home: Diet  Follow a diet as recommended by your health care provider. This may involve avoiding foods and drinks such as: ? Coffee and tea (with or without caffeine). ? Drinks that containalcohol. ? Energy drinks and sports drinks. ? Carbonated drinks or sodas. ? Chocolate and cocoa. ? Peppermint and mint flavorings. ? Garlic and onions. ? Horseradish. ? Spicy and acidic foods, including peppers, chili powder, curry powder, vinegar, hot sauces, and barbecue sauce. ? Citrus fruit juices and citrus fruits, such as oranges, lemons,  and limes. ? Tomato-based foods, such as red sauce, chili, salsa, and pizza with red sauce. ? Fried and fatty foods, such as donuts, french fries, potato chips, and high-fat dressings. ? High-fat meats, such as hot dogs and fatty cuts of red and white meats, such as rib eye steak, sausage, ham, and bacon. ? High-fat dairy items, such as whole milk, butter, and cream cheese.  Eat small, frequent meals instead of large meals.  Avoid drinking large amounts of liquid with your meals.  Avoid eating meals during the 2-3 hours before bedtime.  Avoid lying down right after you eat.  Do not exercise right after you eat. General instructions  Pay attention to any changes in your symptoms.  Take over-the-counter and prescription medicines only as told by your health care provider. Do not take aspirin, ibuprofen, or other NSAIDs unless your health care provider told you to do so.  Do not use any tobacco products, including cigarettes, chewing tobacco, and e-cigarettes. If you need help quitting, ask your health care provider.  Wear loose-fitting clothing. Do not wear anything tight around your waist that causes pressure on your abdomen.  Raise (elevate) the head of your bed 6 inches (15cm).  Try to reduce your stress, such as with yoga or meditation. If you need help reducing stress, ask your health care provider.  If you are overweight, reduce your weight to an amount that is healthy for you. Ask your health care provider for guidance about a safe weight loss goal.  Keep all follow-up visits as told by your health care provider. This is important. Contact a health care provider if:  You have new symptoms.  You have unexplained weight loss.  You have difficulty swallowing, or it hurts to swallow.  You have wheezing or a persistent cough.  Your symptoms do not improve with treatment.  You have a hoarse voice. Get help right away if:  You have pain in your arms, neck, jaw, teeth, or  back.  You feel sweaty, dizzy, or light-headed.  You have chest pain or shortness of breath.  You vomit and your vomit looks like blood or coffee grounds.  You faint.  Your stool is bloody or black.  You cannot swallow, drink, or eat. This information is not intended to replace advice given to you by your health care provider. Make sure you discuss any questions you have with your health care provider. Document Released: 12/16/2004 Document Revised: 08/06/2015 Document Reviewed: 07/03/2014 Elsevier Interactive Patient Education  2017 [employer].

## 2016-11-04 NOTE — Anesthesia Postprocedure Evaluation (Signed)
Anesthesia Post Note  Patient: JACQUEZ SHEETZ  Procedure(s) Performed: Procedure(s) (LRB): COLONOSCOPY WITH PROPOFOL (N/A) ESOPHAGOGASTRODUODENOSCOPY (EGD) WITH PROPOFOL (N/A) MALONEY DILATION (N/A)  Patient location during evaluation: PACU Anesthesia Type: MAC Level of consciousness: awake and patient cooperative Pain management: pain level controlled Vital Signs Assessment: post-procedure vital signs reviewed and stable Respiratory status: spontaneous breathing, respiratory function stable and nonlabored ventilation Cardiovascular status: blood pressure returned to baseline Postop Assessment: no signs of nausea or vomiting Anesthetic complications: no     Last Vitals:  Vitals:   11/04/16 0920 11/04/16 1036  BP: 121/81 94/60  Pulse:  69  Resp: 15 15  Temp:  36.4 C  SpO2: 95% 97%    Last Pain:  Vitals:   11/04/16 0800  TempSrc: Oral                 Onur Mori J

## 2016-11-04 NOTE — H&P (View-Only) (Signed)
Primary Care Physician:  [name], MD Primary Gastroenterologist:  Dr. [name]  Pre-Procedure History & Physical: HPI:  [name] is a 66 y.o. male here for follow-up history of stage III colon cancer, history of Barrett's esophagus and dysphagia. Status post partial gastrectomy for benign gastric tumor many, many years ago. Patient continues to have iron deficiency and has been been receiving iron infusions through the oncology center.  He underwent empiric dilation of a normal aperture esophagus about a year and a half ago. Dysphagia resolved until the past couple months when he's had recurrence and difficulty swallowing. A switch from the Dexilant to  Prevacid 30 mg daily did not make much difference. Although his reflux symptoms are well controlled. He does have some anorexia and early satiety; his weight is stable compared compared with last office visit. He has chronic intermittent diarrhea  -  may have a day without a bowel movement and the next day he may have 4 loose stools. No blood or melena. He's had recurrent issues with abdominal pain and partial small bowel obstructions necessitating multiple hospitalizations and ED visits  -   2 earlier this year.  He takes Lovenox daily at bedtime given history of pulmonary emboli.  Past Medical History:  Diagnosis Date  . 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 Anemia   . Anxiety   . Arthritis   . Barrett's esophagus   . Blood transfusion   . Bowel obstruction ([place]) 05/13/2012   Recurrent  . Bronchitis   . Chest pain at rest   . Chronic abdominal pain   . Erosive esophagitis   . ETOH abuse    quit 03/2010  . GERD (gastroesophageal reflux disease)   . Hx of Clostridium difficile infection 01/2012  . Hypertension   . Ileus ([place])   . Iron deficiency anemia 03/23/2016  . Obstruction of bowel ([place]) 03/03/14  . Osteoporosis   . Personal history of PE (pulmonary embolism) 10/01/2010  . Pneumonia   .  Pulmonary embolism ([place]) 02/2010  . Recurrent upper respiratory infection (URI)   . S/P endoscopy September 28, 2010   erosive reflux esophagitis, Billroth I anatomy  . 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 Seizures ([place])   . Shortness of breath   . Sleep apnea   . TIA (transient ischemic attack) 10/11  . Vitamin B12 deficiency     Past Surgical History:  Procedure Laterality Date  . ABDOMINAL ADHESION SURGERY  03/04/15   @ [facility]  . ABDOMINAL EXPLORATION SURGERY    . abdominal sugery     for bowel obstruction x 8, all in 1980s, except for one in 07/2010  . APPENDECTOMY  1980s  . Billroth 1 hemigastrectomy  1980s   per patient for benign duodenal tumor  . CARDIAC CATHETERIZATION  07/17/2012  . CHOLECYSTECTOMY  1980s  . COLON SURGERY  May 2012   left hemicolectomy, colon cancer found at time of surgery for bowel obstruction  . COLONOSCOPY  03/18/2011   anastomosis at 35cm. Several adenomatous polyps removed. Sigmoid diverticulosis. Next TCS 02/2013  . COLONOSCOPY N/A 07/24/2012   KDX:IPJASN post segmental resection with normal-appearing colonic anastomosis aside from an adjacent polyp-removed as described above. Rectal polyp-removed as described above. CT findings appear to have been artifactual. tubular adenomas/prolapsed type polyp.  . COLONOSCOPY N/A 05/15/2015   Procedure: COLONOSCOPY;  Surgeon: [name], MD;  [facility];  Service: Endoscopy;  Laterality: N/A;  .  ESOPHAGOGASTRODUODENOSCOPY  09/28/2010  . ESOPHAGOGASTRODUODENOSCOPY  12/01/2010   Cervical web status post dilation, erosive esophagitis, B1 hemigastrectomy, inflamed anastomosis  . ESOPHAGOGASTRODUODENOSCOPY  04/16/2011   excoriation at GEJ c/w trauma/M-W tear, friable gastric anastomosis, dilation efferent limb  . ESOPHAGOGASTRODUODENOSCOPY N/A 06/03/2014   Dr.Dannon Perlow- cervcal esopphageal web s/p dilation. abnormal distal esophagus bx= barretts esophagus  . ESOPHAGOGASTRODUODENOSCOPY N/A 05/15/2015   Procedure:  ESOPHAGOGASTRODUODENOSCOPY (EGD);  Surgeon: [name], MD;  [facility];  Service: Endoscopy;  Laterality: N/A;  230  . ESOPHAGOGASTRODUODENOSCOPY (EGD) WITH ESOPHAGEAL DILATION  02/25/2012   WPY:KDXIPJAS esophageal web-s/p dilation anddisruption as described above. Status post prior gastric with Billroth I configuration. Abnormal gastric mucosa at the anastomosis. Gastric biopsy showed mild chronic inflammation but no H. pylori   . HERNIA REPAIR     right inguinal  . MALONEY DILATION N/A 06/03/2014   Procedure: [name] DILATION;  Surgeon: [name], MD;  [facility];  Service: Endoscopy;  Laterality: N/A;  [name] DILATION N/A 05/15/2015   Procedure: [name] DILATION;  Surgeon: [name], MD;  [facility];  Service: Endoscopy;  Laterality: N/A;  . PORTACATH PLACEMENT    . SAVORY DILATION N/A 06/03/2014   Procedure: SAVORY DILATION;  Surgeon: [name], MD;  [facility];  Service: Endoscopy;  Laterality: N/A;    Prior to Admission medications   Medication Sig Start Date End Date Taking? Authorizing Provider  acetaminophen (TYLENOL) 500 MG tablet Take 500 mg by mouth every 6 (six) hours as needed.   Yes [provider]  acidophilus (RISAQUAD) CAPS capsule Take 1 capsule by mouth daily.   Yes [provider]  albuterol (PROAIR HFA) 108 (90 BASE) MCG/ACT inhaler Inhale 1-2 puffs into the lungs every 6 (six) hours as needed for wheezing or shortness of breath.    Yes [provider]  atorvastatin (LIPITOR) 20 MG tablet Take 20 mg by mouth at bedtime.    Yes [provider]  bisacodyl (DULCOLAX) 10 MG suppository Place 1 suppository (10 mg total) rectally daily as needed for moderate constipation. 09/19/16  Yes Kathie Dike, MD  carvedilol (COREG) 6.25 MG tablet Take 6.25 mg by mouth 2 (two) times daily with a meal.   Yes [provider]  cholecalciferol (VITAMIN D) 1000 UNITS tablet Take  1,000 Units by mouth daily.    Yes [provider]  Copper Gluconate 2 MG CAPS Take 1 capsule by mouth daily. 05/03/16  Yes Kefalas, Manon Hilding, PA-C  cyanocobalamin (,VITAMIN B-12,) 1000 MCG/ML injection Inject 1,000 mcg into the muscle every 30 (thirty) days.   Yes [provider]  docusate sodium (COLACE) 100 MG capsule Take 100 mg by mouth daily as needed.    Yes [provider]  enoxaparin (LOVENOX) 60 MG/0.6ML injection Inject 60 mg into the skin at bedtime.    Yes [provider]  ferrous sulfate 325 (65 FE) MG tablet Take 1 tablet (325 mg total) by mouth 2 (two) times daily with a meal. 03/21/16  Yes Thurnell Lose, MD  lansoprazole (PREVACID) 30 MG capsule Take 1 capsule (30 mg total) by mouth daily at 12 noon. 08/11/16  Yes Carlis Stable, NP  levETIRAcetam (KEPPRA) 750 MG tablet Take 750 mg by mouth 2 (two) times daily.   Yes [provider]  lidocaine-prilocaine (EMLA) cream Apply 1 application topically as needed (prior to accessing port).   Yes [provider]  LORazepam (ATIVAN) 1  MG tablet Take 1 tablet (1 mg total) by mouth every 8 (eight) hours as needed for anxiety. 05/23/16  Yes Elgergawy, Silver Huguenin, MD  magnesium oxide (MAGNESIUM-OXIDE) 400 (241.3 Mg) MG tablet Take 400 mg by mouth 2 (two) times daily.   Yes [provider]  Multiple Vitamin (MULTIVITAMIN WITH MINERALS) TABS tablet Take 1 tablet by mouth daily.   Yes [provider]  Nutritional Supplements (ENSURE PLUS HN) LIQD Take 1 Bottle by mouth every other day.   Yes [provider]  polyethylene glycol (MIRALAX / GLYCOLAX) packet Take 17 g by mouth daily. 09/19/16  Yes Kathie Dike, MD  prochlorperazine (COMPAZINE) 10 MG tablet Take 10 mg by mouth every 6 (six) hours as needed for nausea or vomiting.    Yes [provider]  promethazine (PHENERGAN) 12.5 MG tablet Take 1 tablet (12.5 mg total) by mouth every 6 (six) hours as needed for  nausea or vomiting. 09/19/16  Yes Kathie Dike, MD  sucralfate (CARAFATE) 1 GM/10ML suspension Take 10 mLs (1 g total) by mouth 4 (four) times daily. 08/11/16  Yes Carlis Stable, NP  tamsulosin (FLOMAX) 0.4 MG CAPS capsule Take 0.4 mg by mouth at bedtime.    Yes [provider]  Vitamin D, Ergocalciferol, (DRISDOL) 50000 units CAPS capsule Take 50,000 Units by mouth every Sunday.   Yes [provider]  zoledronic acid (RECLAST) 5 MG/100ML SOLN injection Inject 5 mg into the vein See admin instructions. Pt gets a dose yearly.   Yes [provider]    Allergies as of 10/05/2016  . (No Known Allergies)    Family History  Problem Relation Age of Onset  . Hypertension Mother   . Arthritis Mother   . Pneumonia Mother   . Hypertension Father   . Heart attack Father   . Colon cancer Neg Hx     Social History   Social History  . Marital status: Married    Spouse name: N/A  . Number of children: 3  . Years of education: N/A   Occupational History  .  [employer] [employer]   Social History Main Topics  . Smoking status: Former Smoker    Packs/day: 0.50    Years: 40.00    Types: Cigarettes    Quit date: 12/20/2012  . Smokeless tobacco: Never Used  . Alcohol use No  . Drug use: No  . Sexual activity: No   Other Topics Concern  . Not on file   Social History Narrative  . No narrative on file    Review of Systems: See HPI, otherwise negative ROS  Physical Exam: BP (!) 149/93   Pulse 82   Temp 97.9 F (36.6 C) (Oral)   Ht 5\' 9"  (1.753 m)   Wt 155 lb 12.8 oz (70.7 kg)   BMI 23.01 kg/m  General:   pleasant and cooperative in NAD Skin:  Intact without significant lesions or rashes. Eyes:  Sclera clear, no icterus.   Conjunctiva pink. Ears:  Normal auditory acuity. Nose:  No deformity, discharge,  or lesions. Mouth:  No deformity or lesions. Neck:  Supple; no masses or thyromegaly. No significant cervical adenopathy. Lungs:  Clear throughout to  auscultation.   No wheezes, crackles, or rhonchi. No acute distress. Heart:  Regular rate and rhythm; no murmurs, clicks, rubs,  or gallops. Abdomen: Non-distended, well-healed midline laparotomy scar positive bowel sounds soft mild diffuse tenderness to palpation appreciable mass or organomegaly.   Pulses:  Normal pulses noted.  Extremities:  Without clubbing or edema.  Impression:  Pleasant 66 year old gentleman with long-standing GERD with recurrent esophageal dysphagia and history of Barrett's esophagus. Reflux symptoms well controlled. History of partial gastric resection for benign tumor many years ago. Superimposed, history of stage III colon cancer status post subtotal colectomy. From an oncology standpoint, doing well without recurrent disease. Iron deficiency anemia is concerned this may be in part related to malabsorption.  Reported recurrent bouts of abdominal pain and small bowel obstruction necessitating urgent evaluation likely due to adhesive disease. Certainly, no evidence of recurrent tumor on the last cross-sectional imaging study.  Recommendations:  Schedule a EGD with ED for dysphagia and early satiety and diagnostic colonoscopy with Propofol for IDA and hx of colon cancer.  Hold Lovenox the evening before procedure.  The risks, benefits, limitations, imponderables and alternatives regarding both EGD and colonoscopy have been reviewed with the patient. Questions have been answered. All parties agreeable.    Depending on findings, may recommend a CTE to evaluate small bowel for further.   Further recommendations to follow.                            Notice: This dictation was prepared with Dragon dictation along with smaller phrase technology. Any transcriptional errors that result from this process are unintentional and may not be corrected upon review.

## 2016-11-04 NOTE — Anesthesia Preprocedure Evaluation (Signed)
Anesthesia Evaluation  Patient identified by MRN, date of birth, ID band Patient awake    Reviewed: Allergy & Precautions, NPO status , Patient's Chart, lab work & pertinent test results, reviewed documented beta blocker date and time   Airway Mallampati: II  TM Distance: >3 FB     Dental  (+) Edentulous Upper, Edentulous Lower   Pulmonary shortness of breath and with exertion, neg recent URI, former smoker, PE   breath sounds clear to auscultation       Cardiovascular hypertension, Pt. on medications and Pt. on home beta blockers  Rhythm:Regular Rate:Normal     Neuro/Psych Seizures -, Well Controlled,  PSYCHIATRIC DISORDERS Anxiety TIA   GI/Hepatic PUD, GERD  Medicated,(+)     substance abuse (in remission)  alcohol use,   Endo/Other    Renal/GU      Musculoskeletal   Abdominal   Peds  Hematology   Anesthesia Other Findings   Reproductive/Obstetrics                             Anesthesia Physical Anesthesia Plan  ASA: III  Anesthesia Plan:    Post-op Pain Management:    Induction: Intravenous  PONV Risk Score and Plan:   Airway Management Planned: Simple Face Mask  Additional Equipment:   Intra-op Plan:   Post-operative Plan:   Informed Consent: I have reviewed the patients History and Physical, chart, labs and discussed the procedure including the risks, benefits and alternatives for the proposed anesthesia with the patient or authorized representative who has indicated his/her understanding and acceptance.     Plan Discussed with:   Anesthesia Plan Comments:         Anesthesia Quick Evaluation

## 2016-11-04 NOTE — Op Note (Signed)
[facility] Patient Name: [name] Procedure Date: 11/04/2016 10:11 AM MRN: [id number] Date of Birth: [birth year] Attending MD: Norvel Richards , MD CSN: [id number] Age: 66 Admit Type: Outpatient Procedure:                Colonoscopy Indications:              High risk colon cancer surveillance: Personal                            history of colon cancer; IDA Providers:                Norvel Richards, MD, Lurline Del, RN, Randa Spike, Technician Referring MD:             [name] Medicines:                Propofol per Anesthesia Complications:            No immediate complications. Estimated Blood Loss:     Estimated blood loss: none. Procedure:                Pre-Anesthesia Assessment:                           - Prior to the procedure, a History and Physical                            was performed, and patient medications and                            allergies were reviewed. The patient's tolerance of                            previous anesthesia was also reviewed. The risks                            and benefits of the procedure and the sedation                            options and risks were discussed with the patient.                            All questions were answered, and informed consent                            was obtained. Prior Anticoagulants: The patient has                            taken no previous anticoagulant or antiplatelet                            agents. ASA Grade Assessment: II - A patient with  mild systemic disease. After reviewing the risks                            and benefits, the patient was deemed in                            satisfactory condition to undergo the procedure.                           After obtaining informed consent, the colonoscope                            was passed under direct vision. Throughout the   procedure, the patient's blood pressure, pulse, and                            oxygen saturations were monitored continuously. The                            EC-[id number] ([id number]) scope was introduced through                            the and advanced to the the ileocolonic                            anastomosis. The colonoscopy was performed without                            difficulty. The patient tolerated the procedure                            well. The quality of the bowel preparation was                            adequate. The rectum was photographed. Scope In: 10:18:17 AM Scope Out: 10:29:36 AM Scope Withdrawal Time: 0 hours 8 minutes 31 seconds  Total Procedure Duration: 0 hours 11 minutes 19 seconds  Findings:      The perianal and digital rectal examinations were normal. rectum       appeared normal. Surgical anastomosis at 25 cm. Residual rectal colonic       mucosa appeared otherwise normal. Impression:               - status post subtotal colectomy with normal                            appearing residual lower GI tract. Moderate Sedation:      Moderate (conscious) sedation was administered by the endoscopy nurse       and supervised by the endoscopist. The following parameters were       monitored: oxygen saturation, heart rate, blood pressure, respiratory       rate, EKG, adequacy of pulmonary ventilation, and response to care.       Total physician intraservice time was 33 minutes. Recommendation:           - Patient has a contact number available for  emergencies. The signs and symptoms of potential                            delayed complications were discussed with the                            patient. Return to normal activities tomorrow.                            Written discharge instructions were provided to the                            patient.                           - Resume previous diet.                           -  Continue present medications.                           - Repeat colonoscopy in 5 years for surveillance.                           - Return to GI clinic in 2 months. Procedure Code(s):        --- Professional ---                           [phone], Moderate sedation services provided by the                            same physician or other qualified health care                            professional performing the diagnostic or                            therapeutic service that the sedation supports,                            requiring the presence of an independent trained                            observer to assist in the monitoring of the                            patient's level of consciousness and physiological                            status; initial 15 minutes of intraservice time,                            patient age 39 years or older  [address], Moderate sedation services; each additional                            15 minutes intraservice time Diagnosis Code(s):        --- Professional ---                           E08.144, Personal history of other malignant                            neoplasm of large intestine CPT copyright 2016 American Medical Association. All rights reserved. The codes documented in this report are preliminary and upon coder review may  be revised to meet current compliance requirements. Cristopher Estimable. Om Lizotte, MD Norvel Richards, MD 11/04/2016 10:57:28 AM This report has been signed electronically. Number of Addenda: 0

## 2016-11-04 NOTE — Interval H&P Note (Signed)
History and Physical Interval Note:  11/04/2016 9:43 AM  [name]  has presented today for surgery, with the diagnosis of IDA/HISTORY OF COLON CANCER  The various methods of treatment have been discussed with the patient and family. After consideration of risks, benefits and other options for treatment, the patient has consented to  Procedure(s) with comments: COLONOSCOPY WITH PROPOFOL (N/A) - 945  ESOPHAGOGASTRODUODENOSCOPY (EGD) WITH PROPOFOL (N/A) MALONEY DILATION (N/A) as a surgical intervention .  The patient's history has been reviewed, patient examined, no change in status, stable for surgery.  I have reviewed the patient's chart and labs.  Questions were answered to the patient's satisfaction.     [name]  No change. EGD with ED and colonoscopy now being done per plan.  The risks, benefits, limitations, imponderables and alternatives regarding both EGD and colonoscopy have been reviewed with the patient. Questions have been answered. All parties agreeable.

## 2016-11-04 NOTE — Op Note (Signed)
[facility] Patient Name: [name] Procedure Date: 11/04/2016 9:53 AM MRN: [id number] Date of Birth: 12/19/1950 Attending MD: Norvel Richards , MD CSN: [id number] Age: 66 Admit Type: Outpatient Procedure:                Upper GI endoscopy Indications:              Dysphagia; IDA Providers:                Norvel Richards, MD, Lurline Del, RN, Randa Spike, Technician Referring MD:              Medicines:                Propofol per Anesthesia Complications:            No immediate complications. Estimated Blood Loss:     Estimated blood loss was minimal. Procedure:                Pre-Anesthesia Assessment:                           - Prior to the procedure, a History and Physical                            was performed, and patient medications and                            allergies were reviewed. The patient's tolerance of                            previous anesthesia was also reviewed. The risks                            and benefits of the procedure and the sedation                            options and risks were discussed with the patient.                            All questions were answered, and informed consent                            was obtained. Prior Anticoagulants: The patient has                            taken no previous anticoagulant or antiplatelet                            agents. ASA Grade Assessment: II - A patient with                            mild systemic disease. After reviewing the risks  and benefits, the patient was deemed in                            satisfactory condition to undergo the procedure.                           After obtaining informed consent, the endoscope was                            passed under direct vision. Throughout the                            procedure, the patient's blood pressure, pulse, and                            oxygen saturations  were monitored continuously. The                            [id number] ([id number]) scope was introduced through the                            and advanced to the jejunum. The upper GI endoscopy                            was accomplished without difficulty. The patient                            tolerated the procedure well. The patient tolerated                            the procedure well. Scope In: 10:01:55 AM Scope Out: 10:09:52 AM Total Procedure Duration: 0 hours 7 minutes 57 seconds  Findings:      Reflux Esophagitis was found.      2x2 cm tongue of salmon epithelium again seen. No nodularity. Esophagus       appeared patent throughout its course.      Small hiatal hernia was present. status post hemigastrectomy. Single       patent efferent small bowel limb which appeared normal.      An examination of the duodenum was not performed. The scope was       withdrawn. Dilation was performed with a Maloney dilator with mild       resistance at 56 Fr. The scope was withdrawn. Dilation was performed       with a Maloney dilator with mild resistance at 53 Fr. The dilation site       was examined following endoscope reinsertion and showed no change.       Estimated blood loss: none. This was biopsied with a cold forceps for       histology. Estimated blood loss was minimal. Impression:               - reflux esophagitis Esophagitis. Dilated. 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                           - Small hiatal hernia. Status post prior                            hemigastrectomy. Moderate Sedation:      Moderate (conscious) sedation was personally administered by an       anesthesia professional. The following parameters were monitored: oxygen       saturation, heart rate, blood pressure, respiratory rate, EKG, adequacy       of pulmonary ventilation, and response to care. Total  physician       intraservice time was 13 minutes. Recommendation:           - Patient has a contact number available for                            emergencies. The signs and symptoms of potential                            delayed complications were discussed with the                            patient. Return to normal activities tomorrow.                            Written discharge instructions were provided to the                            patient.                           - Resume previous diet.                           - Continue present medications. Increase Prevacid                            to 30 mg twice daily.                           - Await pathology results.                           - Repeat upper endoscopy after studies are complete                            for surveillance based on pathology results.                           - Return to GI clinic in 2 months. See colonoscopy                            report. Procedure Code(s):        --- Professional ---  43239, 51, Esophagogastroduodenoscopy, flexible,                            transoral; with biopsy, single or multiple                           43450, Dilation of esophagus, by unguided sound or                            bougie, single or multiple passes Diagnosis Code(s):        --- Professional ---                           K20.9, Esophagitis, unspecified                           K44.9, Diaphragmatic hernia without obstruction or                            gangrene                           R13.10, Dysphagia, unspecified CPT copyright 2016 American Medical Association. All rights reserved. The codes documented in this report are preliminary and upon coder review may  be revised to meet current compliance requirements. Cristopher Estimable. Verniece Encarnacion, MD Norvel Richards, MD 11/04/2016 10:44:16 AM This report has been signed electronically. Number of Addenda: 0

## 2016-11-04 NOTE — Transfer of Care (Signed)
Immediate Anesthesia Transfer of Care Note  Patient: [name]  Procedure(s) Performed: Procedure(s) with comments: COLONOSCOPY WITH PROPOFOL (N/A) - 945  ESOPHAGOGASTRODUODENOSCOPY (EGD) WITH PROPOFOL (N/A) MALONEY DILATION (N/A)  Patient Location: PACU  Anesthesia Type:MAC  Level of Consciousness: awake and patient cooperative  Airway & Oxygen Therapy: Patient Spontanous Breathing  Post-op Assessment: Report given to RN, Post -op Vital signs reviewed and stable and Patient moving all extremities  Post vital signs: Reviewed and stable  Last Vitals:  Vitals:   11/04/16 0915 11/04/16 0920  BP: 114/82 121/81  Pulse:    Resp: 16 15  Temp:    SpO2: 95% 95%    Last Pain:  Vitals:   11/04/16 0800  TempSrc: Oral         Complications: No apparent anesthesia complications

## 2016-11-05 ENCOUNTER — Encounter: Payer: Self-pay | Admitting: Internal Medicine

## 2016-11-05 ENCOUNTER — Other Ambulatory Visit (HOSPITAL_COMMUNITY): Payer: Self-pay | Admitting: Adult Health

## 2016-11-05 ENCOUNTER — Encounter (HOSPITAL_COMMUNITY): Payer: Medicare Other | Attending: Adult Health

## 2016-11-05 ENCOUNTER — Encounter (HOSPITAL_COMMUNITY): Payer: Self-pay

## 2016-11-05 VITALS — BP 125/84 | HR 85 | Temp 98.5°F | Resp 18

## 2016-11-05 DIAGNOSIS — N644 Mastodynia: Secondary | ICD-10-CM

## 2016-11-05 DIAGNOSIS — Z95828 Presence of other vascular implants and grafts: Secondary | ICD-10-CM

## 2016-11-05 DIAGNOSIS — D509 Iron deficiency anemia, unspecified: Secondary | ICD-10-CM | POA: Diagnosis present

## 2016-11-05 DIAGNOSIS — Z452 Encounter for adjustment and management of vascular access device: Secondary | ICD-10-CM | POA: Insufficient documentation

## 2016-11-05 LAB — CBC WITH DIFFERENTIAL/PLATELET
BASOS ABS: 0 10*3/uL (ref 0.0–0.1)
BASOS PCT: 0 %
EOS ABS: 0.1 10*3/uL (ref 0.0–0.7)
EOS PCT: 1 %
HCT: 45.8 % (ref 39.0–52.0)
Hemoglobin: 15.5 g/dL (ref 13.0–17.0)
LYMPHS PCT: 24 %
Lymphs Abs: 2.3 10*3/uL (ref 0.7–4.0)
MCH: 31.3 pg (ref 26.0–34.0)
MCHC: 33.8 g/dL (ref 30.0–36.0)
MCV: 92.5 fL (ref 78.0–100.0)
MONO ABS: 0.8 10*3/uL (ref 0.1–1.0)
Monocytes Relative: 9 %
Neutro Abs: 6.1 10*3/uL (ref 1.7–7.7)
Neutrophils Relative %: 66 %
PLATELETS: 160 10*3/uL (ref 150–400)
RBC: 4.95 MIL/uL (ref 4.22–5.81)
RDW: 13 % (ref 11.5–15.5)
WBC: 9.3 10*3/uL (ref 4.0–10.5)

## 2016-11-05 LAB — IRON AND TIBC
IRON: 89 ug/dL (ref 45–182)
SATURATION RATIOS: 25 % (ref 17.9–39.5)
TIBC: 361 ug/dL (ref 250–450)
UIBC: 272 ug/dL

## 2016-11-05 LAB — BASIC METABOLIC PANEL
ANION GAP: 10 (ref 5–15)
BUN: 15 mg/dL (ref 6–20)
CALCIUM: 8.7 mg/dL — AB (ref 8.9–10.3)
CO2: 21 mmol/L — ABNORMAL LOW (ref 22–32)
Chloride: 108 mmol/L (ref 101–111)
Creatinine, Ser: 1.54 mg/dL — ABNORMAL HIGH (ref 0.61–1.24)
GFR calc Af Amer: 53 mL/min — ABNORMAL LOW (ref >60)
GFR, EST NON AFRICAN AMERICAN: 46 mL/min — AB (ref >60)
GLUCOSE: 102 mg/dL — AB (ref 65–99)
POTASSIUM: 4 mmol/L (ref 3.5–5.1)
SODIUM: 139 mmol/L (ref 135–145)

## 2016-11-05 LAB — FERRITIN: Ferritin: 60 ng/mL (ref 24–336)

## 2016-11-05 MED ORDER — HEPARIN SOD (PORK) LOCK FLUSH 100 UNIT/ML IV SOLN
500.0000 [IU] | Freq: Once | INTRAVENOUS | Status: AC
  Administered 2016-11-05: 500 [IU] via INTRAVENOUS
  Filled 2016-11-05: qty 5

## 2016-11-05 MED ORDER — SODIUM CHLORIDE 0.9% FLUSH
10.0000 mL | INTRAVENOUS | Status: AC | PRN
  Administered 2016-11-05: 10 mL via INTRAVENOUS
  Filled 2016-11-05: qty 10

## 2016-11-05 NOTE — Patient Instructions (Signed)
[place] at [facility] Discharge Instructions  RECOMMENDATIONS MADE BY THE CONSULTANT AND ANY TEST RESULTS WILL BE SENT TO YOUR REFERRING PHYSICIAN.  You had your port flushed and labs drawn today. Return as scheduled for port flushes.   Thank you for choosing [place] at [facility] to provide your oncology and hematology care.  To afford each patient quality time with our provider, please arrive at least 15 minutes before your scheduled appointment time.    If you have a lab appointment with the [facility] please come in thru the  Main Entrance and check in at the main information desk  You need to re-schedule your appointment should you arrive 10 or more minutes late.  We strive to give you quality time with our providers, and arriving late affects you and other patients whose appointments are after yours.  Also, if you no show three or more times for appointments you may be dismissed from the clinic at the providers discretion.     Again, [facility]  Our hope is that these requests will decrease the amount of time that you wait before being seen by our physicians.       _____________________________________________________________  Should you have questions after your visit to [facility], please contact our office at [phone] between the hours of 8:30 a.m. and 4:30 p.m.  Voicemails left after 4:30 p.m. will not be returned until the following business day.  For prescription refill requests, have your pharmacy contact our office.       Resources For Cancer Patients and their Caregivers ? American Cancer Society: Can assist with transportation, wigs, general needs, runs Look Good Feel Better.        423-334-1888 ? Cancer Care: Provides financial assistance, online support groups, medication/co-pay assistance.  1-800-813-HOPE 641-510-3725) ? [facility] Assists [place] cancer patients and their families through emotional , educational and financial support.  530 476 3689 ? [place] DSS Where to apply for food stamps, Medicaid and utility assistance. (365)584-7990 ? [facility]: Transportation to medical appointments. 229 723 7491 ? Social Security Administration: May apply for disability if have a Stage IV cancer. (978)543-3294 8381007766 ? [employer], Disability and Transit Services: Assists with nutrition, care and transit needs. [place] Support Programs: @10RELATIVEDAYS @ > Cancer Support Group  2nd Tuesday of the month 1pm-2pm, Journey Room  > Creative Journey  3rd Tuesday of the month 1130am-1pm, Journey Room  > Look Good Feel Better  1st Wednesday of the month 10am-12 noon, Journey Room (Call [facility] to register [phone])

## 2016-11-05 NOTE — Progress Notes (Unsigned)
[name] presented for Portacath access and flush.    Portacath located left chest wall accessed with  H 20 needle.  Good blood return present. Portacath flushed with 79ml NS and 500U/28ml Heparin and needle removed intact.  Procedure tolerated well and without incident.   Pt stated that he has noticed some tenderness and pain in his left breast, near the armpit area, shooting pain for about 6 months. Pt spoke with Lupita Raider RN. Pt states that he has had an ultrasound on the breast/chest area and they did not see anything.   Pt stable and discharged home ambulatory.

## 2016-11-06 LAB — CEA: CEA1: 2.6 ng/mL (ref 0.0–4.7)

## 2016-11-15 ENCOUNTER — Other Ambulatory Visit: Payer: Self-pay | Admitting: Adult Health

## 2016-11-15 ENCOUNTER — Encounter (HOSPITAL_COMMUNITY): Payer: Self-pay | Admitting: Internal Medicine

## 2016-11-15 DIAGNOSIS — N644 Mastodynia: Secondary | ICD-10-CM

## 2016-11-16 ENCOUNTER — Encounter (HOSPITAL_COMMUNITY): Payer: Medicare Other

## 2016-11-16 ENCOUNTER — Ambulatory Visit (HOSPITAL_COMMUNITY)
Admission: RE | Admit: 2016-11-16 | Discharge: 2016-11-16 | Disposition: A | Discharge status: Home / Self Care | Payer: Medicare Other | Source: Ambulatory Visit | Attending: Adult Health | Admitting: Adult Health

## 2016-11-16 DIAGNOSIS — N644 Mastodynia: Secondary | ICD-10-CM | POA: Diagnosis present

## 2016-11-16 DIAGNOSIS — N62 Hypertrophy of breast: Secondary | ICD-10-CM | POA: Insufficient documentation

## 2016-11-23 ENCOUNTER — Encounter (HOSPITAL_COMMUNITY): Payer: Medicare Other

## 2016-12-21 ENCOUNTER — Emergency Department (HOSPITAL_COMMUNITY): Payer: Medicare Other

## 2016-12-21 ENCOUNTER — Emergency Department (HOSPITAL_COMMUNITY)
Admission: EM | Admit: 2016-12-21 | Discharge: 2016-12-21 | Disposition: A | Discharge status: Home / Self Care | Payer: Medicare Other | Attending: Emergency Medicine | Admitting: Emergency Medicine

## 2016-12-21 ENCOUNTER — Encounter (HOSPITAL_COMMUNITY): Payer: Self-pay | Admitting: Cardiology

## 2016-12-21 DIAGNOSIS — D649 Anemia, unspecified: Secondary | ICD-10-CM | POA: Insufficient documentation

## 2016-12-21 DIAGNOSIS — Z87891 Personal history of nicotine dependence: Secondary | ICD-10-CM | POA: Insufficient documentation

## 2016-12-21 DIAGNOSIS — R110 Nausea: Secondary | ICD-10-CM

## 2016-12-21 DIAGNOSIS — I129 Hypertensive chronic kidney disease with stage 1 through stage 4 chronic kidney disease, or unspecified chronic kidney disease: Secondary | ICD-10-CM | POA: Insufficient documentation

## 2016-12-21 DIAGNOSIS — R1012 Left upper quadrant pain: Secondary | ICD-10-CM | POA: Insufficient documentation

## 2016-12-21 DIAGNOSIS — R112 Nausea with vomiting, unspecified: Secondary | ICD-10-CM | POA: Insufficient documentation

## 2016-12-21 DIAGNOSIS — Z8673 Personal history of transient ischemic attack (TIA), and cerebral infarction without residual deficits: Secondary | ICD-10-CM | POA: Insufficient documentation

## 2016-12-21 DIAGNOSIS — N183 Chronic kidney disease, stage 3 (moderate): Secondary | ICD-10-CM | POA: Insufficient documentation

## 2016-12-21 LAB — TROPONIN I: Troponin I: <0.03 ng/mL (ref <0.03)

## 2016-12-21 LAB — COMPREHENSIVE METABOLIC PANEL
ALBUMIN: 4.2 g/dL (ref 3.5–5.0)
ALT: 26 U/L (ref 17–63)
ANION GAP: 8 (ref 5–15)
AST: 27 U/L (ref 15–41)
Alkaline Phosphatase: 98 U/L (ref 38–126)
BUN: 8 mg/dL (ref 6–20)
CHLORIDE: 109 mmol/L (ref 101–111)
CO2: 25 mmol/L (ref 22–32)
Calcium: 9.1 mg/dL (ref 8.9–10.3)
Creatinine, Ser: 1.52 mg/dL — ABNORMAL HIGH (ref 0.61–1.24)
GFR calc Af Amer: 54 mL/min — ABNORMAL LOW (ref >60)
GFR calc non Af Amer: 46 mL/min — ABNORMAL LOW (ref >60)
GLUCOSE: 83 mg/dL (ref 65–99)
POTASSIUM: 4.8 mmol/L (ref 3.5–5.1)
SODIUM: 142 mmol/L (ref 135–145)
TOTAL PROTEIN: 7.5 g/dL (ref 6.5–8.1)
Total Bilirubin: 0.8 mg/dL (ref 0.3–1.2)

## 2016-12-21 LAB — CBC
HEMATOCRIT: 45.4 % (ref 39.0–52.0)
HEMOGLOBIN: 15.3 g/dL (ref 13.0–17.0)
MCH: 31.2 pg (ref 26.0–34.0)
MCHC: 33.7 g/dL (ref 30.0–36.0)
MCV: 92.7 fL (ref 78.0–100.0)
Platelets: 216 10*3/uL (ref 150–400)
RBC: 4.9 MIL/uL (ref 4.22–5.81)
RDW: 13.1 % (ref 11.5–15.5)
WBC: 8.9 10*3/uL (ref 4.0–10.5)

## 2016-12-21 LAB — PROTIME-INR
INR: 0.96
PROTHROMBIN TIME: 12.7 s (ref 11.4–15.2)

## 2016-12-21 LAB — LIPASE, BLOOD: LIPASE: 33 U/L (ref 11–51)

## 2016-12-21 MED ORDER — SUCRALFATE 1 GM/10ML PO SUSP: 1.0000 g | Freq: Three times a day (TID) | ORAL | 0 refills | Status: DC

## 2016-12-21 MED ORDER — KETOROLAC TROMETHAMINE 30 MG/ML IJ SOLN: 30.0000 mg | Freq: Once | INTRAMUSCULAR | Status: DC

## 2016-12-21 MED ORDER — ONDANSETRON 4 MG PO TBDP: 4.0000 mg | ORAL_TABLET | Freq: Three times a day (TID) | ORAL | 0 refills | Status: DC | PRN

## 2016-12-21 MED ORDER — HEPARIN SOD (PORK) LOCK FLUSH 100 UNIT/ML IV SOLN
INTRAVENOUS | Status: AC
  Administered 2016-12-21: 19:00:00
  Filled 2016-12-21: qty 5

## 2016-12-21 MED ORDER — MORPHINE SULFATE (PF) 4 MG/ML IV SOLN
4.0000 mg | Freq: Once | INTRAVENOUS | Status: AC
  Administered 2016-12-21: 4 mg via INTRAVENOUS
  Filled 2016-12-21: qty 1

## 2016-12-21 MED ORDER — TRAMADOL HCL 50 MG PO TABS: 50.0000 mg | ORAL_TABLET | Freq: Four times a day (QID) | ORAL | 0 refills | Status: DC | PRN

## 2016-12-21 MED ORDER — PANTOPRAZOLE SODIUM 20 MG PO TBEC: 20.0000 mg | DELAYED_RELEASE_TABLET | Freq: Every day | ORAL | 0 refills | Status: DC

## 2016-12-21 MED ORDER — IOPAMIDOL (ISOVUE-300) INJECTION 61%
100.0000 mL | Freq: Once | INTRAVENOUS | Status: AC | PRN
  Administered 2016-12-21: 100 mL via INTRAVENOUS

## 2016-12-21 MED ORDER — SODIUM CHLORIDE 0.9 % IV BOLUS (SEPSIS)
500.0000 mL | Freq: Once | INTRAVENOUS | Status: AC
  Administered 2016-12-21: 500 mL via INTRAVENOUS

## 2016-12-21 MED ORDER — ONDANSETRON HCL 4 MG/2ML IJ SOLN
4.0000 mg | Freq: Once | INTRAMUSCULAR | Status: AC
  Administered 2016-12-21: 4 mg via INTRAVENOUS
  Filled 2016-12-21: qty 2

## 2016-12-21 NOTE — Discharge Instructions (Signed)

## 2016-12-21 NOTE — ED Triage Notes (Signed)
LUQ abdominal pain since Sunday night.  Sob for several months that is worse since having the pain.  C/o feeling like something is stuck in throat since last night.

## 2016-12-21 NOTE — ED Notes (Signed)
Patient transported to X-ray 

## 2016-12-21 NOTE — ED Provider Notes (Signed)
Emergency Department Provider Note   I have reviewed the triage vital signs and the nursing notes.   HISTORY  Chief Complaint Abdominal Pain   HPI [name] is a 66 y.o. male with PMH of SBO, partial gastrectomy, GI bleeding, h/o colon cancer, CKD, GERD, and h/o PE who presents to the emergency department for evaluation of left upper quadrant abdominal pain which began 3 days prior to ED presentation. He describes it as a constant, cramping pain focal in the left upper quadrant with slight radiation to the left flank. No radiation to the chest or lower abdomen. Patient denies any dysuria, hesitancy, urgency. He denies any vomiting but has had some nausea. No diarrhea. No blood in the stool. He's had decreased appetite over the last 2 days and is here mostly to a liquid diet. He notes a sensation of something in his throat but denies any sudden onset symptoms or choking episodes. He is drinking fluids without difficulty. Food does not make his abdominal pain significantly worse. Patient also notes dyspnea, mainly with exertion, over the past several months. He states that that symptom has worsened slightly since the onset of his abdominal discomfort.   Past Medical History:  Diagnosis Date  . 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 Anemia   . Anxiety   . Arthritis   . Barrett's esophagus   . Blood transfusion   . Bowel obstruction ([place]) 05/13/2012   Recurrent  . Bronchitis   . Chest pain at rest   . Chronic abdominal pain   . Erosive esophagitis   . ETOH abuse    quit 03/2010  . GERD (gastroesophageal reflux disease)   . Hx of Clostridium difficile infection 01/2012  . Hypertension   . Ileus ([place])   . Iron deficiency anemia 03/23/2016  . Obstruction of bowel ([place]) 03/03/14  . Osteoporosis   . Personal history of PE (pulmonary embolism) 10/01/2010  . Pneumonia   . Pulmonary embolism ([place]) 02/2010  . Recurrent upper respiratory infection (URI)    . S/P endoscopy September 28, 2010   erosive reflux esophagitis, Billroth I anatomy  . 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 Seizures ([place])   . Shortness of breath   . TIA (transient ischemic attack) 10/11  . Vitamin B12 deficiency     Patient Active Problem List   Diagnosis Date Noted  . Seizure disorder ([place]) 09/16/2016  . CKD (chronic kidney disease) stage 3, GFR 30-59 ml/min (HCC) 06/14/2016  . Patient has nasogastric tube   . Ileus ([place]) 05/15/2016  . Chronic diastolic heart failure ([place]) 05/15/2016  . Iron deficiency anemia 03/23/2016  . Acute renal failure ([place]) 03/19/2016  . Normocytic anemia 03/19/2016  . Hypotension 03/19/2016  . Anemia   . History of colon cancer, stage III   . Hx of colon cancer, stage III   . History of colonic polyps   . Barrett's esophagus without dysplasia   . Dysphagia   . Essential hypertension 11/30/2014  . Dysphagia, pharyngoesophageal phase   . Mucosal abnormality of esophagus   . SBO (small bowel obstruction) ([place]) 09/17/2013  . Rectal bleeding 07/11/2012  . Bowel obstruction ([place]) 07/11/2012  . Abnormal CT scan, colon 07/11/2012  . Esophageal dysphagia 02/22/2012  . H/O Clostridium difficile infection 02/22/2012  . Diarrhea 09/10/2011  . Cellulitis 09/10/2011  . Nausea 06/07/2011  . Abdominal distention 06/07/2011  . HTN (hypertension) 06/07/2011  . Partial small bowel obstruction ([place]) 05/31/2011  .  GI bleed 05/27/2011  . Coagulopathy ([place]) 05/27/2011  . Gastroenteritis 05/27/2011  . Small bowel obstruction ([place]) 05/13/2011  . Left sided abdominal pain 05/13/2011  . Pulmonary embolism ([place]) 01/17/2011  . Chest pain at rest 01/17/2011  . Pneumonia 12/11/2010  . Hematemesis 12/05/2010  . Chronic abdominal pain 12/05/2010  . Pancytopenia due to antineoplastic chemotherapy ([place]) 12/05/2010  . Coffee ground emesis 11/29/2010  . Esophageal reflux disease 11/15/2010  . 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 Personal history of PE  (pulmonary embolism) 10/01/2010  . Bronchitis, acute 10/01/2010  . Erosive esophagitis 10/01/2010  . Fever chills 09/29/2010  . Bleeding gastrointestinal 09/26/2010  . Supratherapeutic INR 09/26/2010  . Abdominal pain 09/26/2010  . Nausea and vomiting 09/26/2010  . Anemia, chronic disease 09/26/2010  . Adenocarcinoma of colon with mucinous features 09/22/2010    Past Surgical History:  Procedure Laterality Date  . ABDOMINAL ADHESION SURGERY  03/04/15   @ [facility]  . ABDOMINAL EXPLORATION SURGERY    . abdominal sugery     for bowel obstruction x 8, all in 1980s, except for one in 07/2010  . APPENDECTOMY  1980s  . Billroth 1 hemigastrectomy  1980s   per patient for benign duodenal tumor  . CARDIAC CATHETERIZATION  07/17/2012  . CHOLECYSTECTOMY  1980s  . COLON SURGERY  May 2012   left hemicolectomy, colon cancer found at time of surgery for bowel obstruction  . COLONOSCOPY  03/18/2011   anastomosis at 35cm. Several adenomatous polyps removed. Sigmoid diverticulosis. Next TCS 02/2013  . COLONOSCOPY N/A 07/24/2012   TOI:[id number] post segmental resection with normal-appearing colonic anastomosis aside from an adjacent polyp-removed as described above. Rectal polyp-removed as described above. CT findings appear to have been artifactual. tubular adenomas/prolapsed type polyp.  . COLONOSCOPY N/A 05/15/2015   Procedure: COLONOSCOPY;  Surgeon: [name], MD;  [facility];  Service: Endoscopy;  Laterality: N/A;  . COLONOSCOPY WITH PROPOFOL N/A 11/04/2016   Procedure: COLONOSCOPY WITH PROPOFOL;  Surgeon: [name], MD;  [facility];  Service: Endoscopy;  Laterality: N/A;  945   . ESOPHAGOGASTRODUODENOSCOPY  09/28/2010  . ESOPHAGOGASTRODUODENOSCOPY  12/01/2010   Cervical web status post dilation, erosive esophagitis, B1 hemigastrectomy, inflamed anastomosis  . ESOPHAGOGASTRODUODENOSCOPY  04/16/2011   excoriation at GEJ c/w trauma/M-W tear, friable gastric anastomosis,  dilation efferent limb  . ESOPHAGOGASTRODUODENOSCOPY N/A 06/03/2014   Dr.[name]- cervcal esopphageal web s/p dilation. abnormal distal esophagus bx= barretts esophagus  . ESOPHAGOGASTRODUODENOSCOPY N/A 05/15/2015   Procedure: ESOPHAGOGASTRODUODENOSCOPY (EGD);  Surgeon: [name], MD;  [facility];  Service: Endoscopy;  Laterality: N/A;  230  . ESOPHAGOGASTRODUODENOSCOPY (EGD) WITH ESOPHAGEAL DILATION  02/25/2012   [id number] esophageal web-s/p dilation anddisruption as described above. Status post prior gastric with Billroth I configuration. Abnormal gastric mucosa at the anastomosis. Gastric biopsy showed mild chronic inflammation but no H. pylori   . ESOPHAGOGASTRODUODENOSCOPY (EGD) WITH PROPOFOL N/A 11/04/2016   Procedure: ESOPHAGOGASTRODUODENOSCOPY (EGD) WITH PROPOFOL;  Surgeon: [name], MD;  [facility];  Service: Endoscopy;  Laterality: N/A;  . HERNIA REPAIR     right inguinal  . MALONEY DILATION N/A 06/03/2014   Procedure: [name] DILATION;  Surgeon: [name], MD;  [facility];  Service: Endoscopy;  Laterality: N/A;  [name] DILATION N/A 05/15/2015   Procedure: [name] DILATION;  Surgeon: [name], MD;  [facility];  Service: Endoscopy;  Laterality: N/A;  . MALONEY DILATION N/A 11/04/2016  Procedure: MALONEY DILATION;  Surgeon: [name], MD;  [facility];  Service: Endoscopy;  Laterality: N/A;  . PORTACATH PLACEMENT    . SAVORY DILATION N/A 06/03/2014   Procedure: SAVORY DILATION;  Surgeon: [name], MD;  [facility];  Service: Endoscopy;  Laterality: N/A;    Current Outpatient Rx  . Order #: [id number] Class: Historical Med  . Order #: [id number] Class: Historical Med  . Order #: [id number] Class: Historical Med  . Order #: [id number] Class: Historical Med  . Order #: [id number] Class: Historical Med  . Order #: [id number] Class: Normal  . Order #: [id number] Class: Historical Med  . Order  #: [id number] Class: Historical Med  . Order #: [id number] Class: Historical Med  . Order #: [id number] Class: Normal  . Order #: [id number] Class: Normal  . Order #: [id number] Class: Historical Med  . Order #: [id number] Class: Historical Med  . Order #: [id number] Class: No Print  . Order #: [id number] Class: Historical Med  . Order #: [id number] Class: Historical Med  . Order #: [id number] Class: Historical Med  . Order #: [id number] Class: Print  . Order #: [id number] Class: Historical Med  . Order #: [id number] Class: Print  . Order #: [id number] Class: Normal  . Order #: [id number] Class: Normal  . Order #: [id number] Class: Historical Med  . Order #: [id number] Class: Historical Med  . Order #: [id number] Class: Normal  . Order #: [id number] Class: Historical Med  . Order #: [id number] Class: Print  . Order #: [id number] Class: Historical Med  . Order #: [id number] Class: Print  . Order #: [id number] Class: Historical Med  . Order #: [id number] Class: Historical Med    Allergies Patient has no known allergies.  Family History  Problem Relation Age of Onset  . Hypertension Mother   . Arthritis Mother   . Pneumonia Mother   . Hypertension Father   . Heart attack Father   . Colon cancer Neg Hx     Social History Social History  Substance Use Topics  . Smoking status: Former Smoker    Packs/day: 0.50    Years: 40.00    Types: Cigarettes    Quit date: 12/20/2012  . Smokeless tobacco: Never Used  . Alcohol use No    Review of Systems  Constitutional: No fever/chills Eyes: No visual changes. ENT: No sore throat. Cardiovascular: Denies chest pain. Respiratory: Denies shortness of breath. Gastrointestinal: Positive LUQ abdominal pain. Positive nausea, no vomiting.  No diarrhea.  No constipation. Genitourinary: Negative for dysuria. Musculoskeletal: Negative for back pain. Skin: Negative for rash. Neurological: Negative for headaches, focal weakness or numbness.  10-point ROS otherwise  negative.  ____________________________________________   PHYSICAL EXAM:  VITAL SIGNS: ED Triage Vitals  Enc Vitals Group     BP 12/21/16 1520 (!) 172/96     Pulse Rate 12/21/16 1520 85     Resp 12/21/16 1520 16     Temp 12/21/16 1520 97.9 F (36.6 C)     Temp Source 12/21/16 1520 Oral     SpO2 12/21/16 1520 100 %     Weight 12/21/16 1518 158 lb (71.7 kg)     Height 12/21/16 1518 5\' 9"  (1.753 m)     Pain Score 12/21/16 1517 8    Constitutional: Alert and oriented. Well appearing and in no acute distress. Eyes: Conjunctivae are normal. Head: Atraumatic. Nose: No congestion/rhinnorhea. Mouth/Throat: Mucous membranes are moist.  Neck: No stridor.   Cardiovascular: Normal rate, regular rhythm. Good peripheral circulation. Grossly normal heart sounds.   Respiratory: Normal respiratory effort.  No retractions. Lungs CTAB. Gastrointestinal: Soft with focal  LUQ tenderness to palpation. No rebound or guarding. No distention.  Musculoskeletal: No lower extremity tenderness nor edema. No gross deformities of extremities. Neurologic:  Normal speech and language. No gross focal neurologic deficits are appreciated.  Skin:  Skin is warm, dry and intact. No rash noted.  ____________________________________________   LABS (all labs ordered are listed, but only abnormal results are displayed)  Labs Reviewed  COMPREHENSIVE METABOLIC PANEL - Abnormal; Notable for the following:       Result Value   Creatinine, Ser 1.52 (*)    GFR calc non Af Amer 46 (*)    GFR calc Af Amer 54 (*)    All other components within normal limits  LIPASE, BLOOD  CBC  TROPONIN I  PROTIME-INR   ____________________________________________  EKG   EKG Interpretation  Date/Time:  Tuesday December 21 2016 16:46:20 EDT Ventricular Rate:  71 PR Interval:  122 QRS Duration: 89 QT Interval:  412 QTC Calculation: 448 R Axis:   -15 Text Interpretation:  Sinus rhythm Atrial premature complex Abnormal R-wave  progression, early transition Left ventricular hypertrophy No STEMI.  Confirmed by Nanda Quinton [phone]) on 12/21/2016 4:56:08 PM       ____________________________________________  RADIOLOGY  Dg Chest 2 View  Result Date: 12/21/2016 CLINICAL DATA:  Shortness of breath. EXAM: CHEST  2 VIEW COMPARISON:  Chest x-ray dated October 16, 2016. FINDINGS: Left chest wall port catheter with tip over the mid SVC, unchanged. The cardiomediastinal silhouette is normal in size. Normal pulmonary vascularity. Bibasilar atelectasis/scarring, similar to prior study. No focal consolidation, pleural effusion, or pneumothorax. Unchanged mid thoracic vertebral body compression deformity. IMPRESSION: No active cardiopulmonary disease. Electronically Signed   By: Titus Dubin M.D.   On: 12/21/2016 17:27   Ct Abdomen Pelvis W Contrast  Result Date: 12/21/2016 CLINICAL DATA:  Initial evaluation for acute left upper quadrant abdominal pain. EXAM: CT ABDOMEN AND PELVIS WITH CONTRAST TECHNIQUE: Multidetector CT imaging of the abdomen and pelvis was performed using the standard protocol following bolus administration of intravenous contrast. CONTRAST:  148mL ISOVUE-300 IOPAMIDOL (ISOVUE-300) INJECTION 61% COMPARISON:  Prior radiograph from 09/15/2016 as well as CT from 06/11/2016. FINDINGS: Lower chest: Mild scattered atelectatic changes present within the visualized lung bases. Visualized lungs are otherwise clear. Hepatobiliary: Subcentimeter hypodensity within left hepatic lobe noted, too small the characterize, but stable from previous and of doubtful significance. Liver otherwise unremarkable. Gallbladder surgically absent. Intra and extrahepatic biliary dilatation like related to post cholecystectomy changes, similar to previous. Pancreas: Pancreas demonstrates no acute inflammatory changes or mass lesion. Pancreatic duct mildly prominent measuring up to 3 mm, also similar from previous. Spleen: Spleen within normal limits.  Adrenals/Urinary Tract: Adrenal glands are normal. Kidneys equal in size with symmetric enhancement. Right renal cysts are stable from previous. 4 mm nonobstructive left renal calculus also stable. Few punctate nonobstructive right renal calculi noted as well. No hydronephrosis. No focal enhancing renal mass. No hydroureter. Partially distended bladder within normal limits. Stomach/Bowel: Sequelae of prior partial gastrectomy and gastrojejunostomy again noted. No evidence for obstruction. Postsurgical changes within the lower mid pelvis also unchanged. Appendix not visualize, consistent with history prior appendectomy. No acute inflammatory changes seen about the bowels. Vascular/Lymphatic: Moderate aortic atherosclerosis. No aneurysm. Normal intravascular enhancement seen throughout the intra-abdominal aorta and its branch vessels. Few mildly prominent subcentimeter mesenteric lymph nodes noted within left ab no, similar to previous. No pathologically enlarged intra-abdominal or pelvic lymph nodes identified. Reproductive: Prostate mildly enlarged measuring 4.9 cm in transverse diameter. Other:  No free air or fluid. Musculoskeletal: No acute osseous abnormality. No worrisome lytic or blastic osseous lesions. IMPRESSION: 1. No CT evidence for acute intra-abdominal or pelvic process. 2. Nonobstructive bilateral nephrolithiasis. 3. Stable postsurgical changes from prior partial gastrectomy and gastrojejunostomy without complication. 4. Aortic atherosclerosis. Electronically Signed   By: Jeannine Boga M.D.   On: 12/21/2016 17:49    ____________________________________________   PROCEDURES  Procedure(s) performed:   Procedures  None ____________________________________________   INITIAL IMPRESSION / ASSESSMENT AND PLAN / ED COURSE  Pertinent labs & imaging results that were available during my care of the patient were reviewed by me and considered in my medical decision making (see chart for  details).  Patient presents to the emergency department for evaluation of left upper quadrant abdominal pain worsening over the last 3 days. He also has sensation of something in his throat but denies difficulty passing solid foods or fluids. No stridor. He's had chronic shortness of breath worsening slightly since onset of abdominal symptoms. He has complicated past history of small bowel obstruction and partial gastrectomy. No active vomiting. Very low suspicion for GU or vascular etiology of pain. Plan for labs, CT abdomen/pelvis, and troponin to evaluate for atypical ACS.   06:30 PM Patient with no acute findings on CT abdomen pelvis or chest x-ray. Labs are largely unremarkable. Suspect gastritis as the cause of pain. With several days of symptoms will not trend troponin to further evaluate for atypical ACS. Plan for tramadol, Carafate, and GI follow up.   At this time, I do not feel there is any life-threatening condition present. I have reviewed and discussed all results (EKG, imaging, lab, urine as appropriate), exam findings with patient. I have reviewed nursing notes and appropriate previous records.  I feel the patient is safe to be discharged home without further emergent workup. Discussed usual and customary return precautions. Patient and family (if present) verbalize understanding and are comfortable with this plan.  Patient will follow-up with their primary care provider. If they do not have a primary care provider, information for follow-up has been provided to them. All questions have been answered.  ____________________________________________  FINAL CLINICAL IMPRESSION(S) / ED DIAGNOSES  Final diagnoses:  Left upper quadrant pain  Nausea     MEDICATIONS GIVEN DURING THIS VISIT:  Medications  ondansetron (ZOFRAN) injection 4 mg (4 mg Intravenous Given 12/21/16 1701)  sodium chloride 0.9 % bolus 500 mL (500 mLs Intravenous New Bag/Given 12/21/16 1702)  morphine 4 MG/ML  injection 4 mg (4 mg Intravenous Given 12/21/16 1702)  iopamidol (ISOVUE-300) 61 % injection 100 mL (100 mLs Intravenous Contrast Given 12/21/16 1720)     NEW OUTPATIENT MEDICATIONS STARTED DURING THIS VISIT:  New Prescriptions   ONDANSETRON (ZOFRAN ODT) 4 MG DISINTEGRATING TABLET    Take 1 tablet (4 mg total) by mouth every 8 (eight) hours as needed for nausea or vomiting.   PANTOPRAZOLE (PROTONIX) 20 MG TABLET    Take 1 tablet (20 mg total) by mouth daily.   SUCRALFATE (CARAFATE) 1 GM/10ML SUSPENSION    Take 10 mLs (1 g total) by mouth 4 (four) times daily -  with meals and at bedtime.   TRAMADOL (ULTRAM) 50 MG TABLET    Take 1 tablet (50 mg total) by mouth every 6 (six) hours as needed.    Note:  This document was prepared using Dragon voice recognition software and may include unintentional dictation errors.  Nanda Quinton, MD Emergency Medicine    Long, [facility], MD  12/21/16 1837  

## 2017-01-14 ENCOUNTER — Encounter (HOSPITAL_COMMUNITY): Payer: Self-pay

## 2017-01-14 ENCOUNTER — Encounter (HOSPITAL_COMMUNITY): Payer: Medicare Other | Attending: Adult Health

## 2017-01-14 DIAGNOSIS — Z452 Encounter for adjustment and management of vascular access device: Secondary | ICD-10-CM | POA: Diagnosis present

## 2017-01-14 DIAGNOSIS — D509 Iron deficiency anemia, unspecified: Secondary | ICD-10-CM | POA: Diagnosis present

## 2017-01-14 LAB — IRON AND TIBC
Iron: 106 ug/dL (ref 45–182)
Saturation Ratios: 28 % (ref 17.9–39.5)
TIBC: 374 ug/dL (ref 250–450)
UIBC: 268 ug/dL

## 2017-01-14 LAB — CBC WITH DIFFERENTIAL/PLATELET
BASOS PCT: 0 %
Basophils Absolute: 0 10*3/uL (ref 0.0–0.1)
Eosinophils Absolute: 0.2 10*3/uL (ref 0.0–0.7)
Eosinophils Relative: 2 %
HEMATOCRIT: 46.3 % (ref 39.0–52.0)
HEMOGLOBIN: 15.4 g/dL (ref 13.0–17.0)
LYMPHS PCT: 25 %
Lymphs Abs: 2.3 10*3/uL (ref 0.7–4.0)
MCH: 31.5 pg (ref 26.0–34.0)
MCHC: 33.3 g/dL (ref 30.0–36.0)
MCV: 94.7 fL (ref 78.0–100.0)
MONOS PCT: 8 %
Monocytes Absolute: 0.8 10*3/uL (ref 0.1–1.0)
NEUTROS ABS: 5.9 10*3/uL (ref 1.7–7.7)
NEUTROS PCT: 65 %
Platelets: 176 10*3/uL (ref 150–400)
RBC: 4.89 MIL/uL (ref 4.22–5.81)
RDW: 13.7 % (ref 11.5–15.5)
WBC: 9.2 10*3/uL (ref 4.0–10.5)

## 2017-01-14 LAB — BASIC METABOLIC PANEL
Anion gap: 11 (ref 5–15)
BUN: 11 mg/dL (ref 6–20)
CHLORIDE: 109 mmol/L (ref 101–111)
CO2: 22 mmol/L (ref 22–32)
CREATININE: 1.4 mg/dL — AB (ref 0.61–1.24)
Calcium: 8.6 mg/dL — ABNORMAL LOW (ref 8.9–10.3)
GFR calc non Af Amer: 51 mL/min — ABNORMAL LOW (ref >60)
GFR, EST AFRICAN AMERICAN: 59 mL/min — AB (ref >60)
Glucose, Bld: 89 mg/dL (ref 65–99)
Potassium: 3.7 mmol/L (ref 3.5–5.1)
Sodium: 142 mmol/L (ref 135–145)

## 2017-01-14 LAB — FERRITIN: FERRITIN: 27 ng/mL (ref 24–336)

## 2017-01-14 MED ORDER — HEPARIN SOD (PORK) LOCK FLUSH 100 UNIT/ML IV SOLN
500.0000 [IU] | Freq: Once | INTRAVENOUS | Status: AC
  Administered 2017-01-14: 500 [IU] via INTRAVENOUS
  Filled 2017-01-14: qty 5

## 2017-01-14 MED ORDER — SODIUM CHLORIDE 0.9% FLUSH
10.0000 mL | Freq: Once | INTRAVENOUS | Status: AC
  Administered 2017-01-14: 10 mL via INTRAVENOUS

## 2017-01-14 NOTE — Progress Notes (Signed)
Port flush and labs today.  No complaints voiced with flush.  Site clean and dry with no bruising or swelling noted at site.  Band aid applied.  VSS with discharge and left ambulatory.  No s/s of distress noted.

## 2017-01-14 NOTE — Patient Instructions (Signed)
[facility]  Discharge Instructions:  Your port was flushed today. _______________________________________________________________  Thank you for choosing  [facility] to provide your oncology and hematology care.  To afford each patient quality time with our providers, please arrive at least 15 minutes before your scheduled appointment.  You need to re-schedule your appointment if you arrive 10 or more minutes late.  We strive to give you quality time with our providers, and arriving late affects you and other patients whose appointments are after yours.  Also, if you no show three or more times for appointments you may be dismissed from the clinic.  Again, thank you for choosing  [facility]. Our hope is that these requests will allow you access to exceptional care and in a timely manner. _______________________________________________________________  If you have questions after your visit, please contact our office at [phone] between the hours of 8:30 a.m. and 5:00 p.m. Voicemails left after 4:30 p.m. will not be returned until the following business day. _______________________________________________________________  For prescription refill requests, have your pharmacy contact our office. _______________________________________________________________  Recommendations made by the consultant and any test results will be sent to your referring physician. _______________________________________________________________ 

## 2017-01-31 ENCOUNTER — Ambulatory Visit (INDEPENDENT_AMBULATORY_CARE_PROVIDER_SITE_OTHER): Payer: Medicare Other | Admitting: Gastroenterology

## 2017-01-31 ENCOUNTER — Encounter: Payer: Self-pay | Admitting: Gastroenterology

## 2017-01-31 VITALS — BP 142/91 | HR 74 | Temp 96.9°F | Ht 69.0 in | Wt 159.2 lb

## 2017-01-31 DIAGNOSIS — K2100 Gastro-esophageal reflux disease with esophagitis, without bleeding: Secondary | ICD-10-CM

## 2017-01-31 DIAGNOSIS — R1310 Dysphagia, unspecified: Secondary | ICD-10-CM

## 2017-01-31 DIAGNOSIS — R1319 Other dysphagia: Secondary | ICD-10-CM

## 2017-01-31 DIAGNOSIS — K2270 Barrett's esophagus without dysplasia: Secondary | ICD-10-CM

## 2017-01-31 MED ORDER — LANSOPRAZOLE 30 MG PO CPDR: 30.0000 mg | DELAYED_RELEASE_CAPSULE | Freq: Two times a day (BID) | ORAL | 1 refills | Status: DC

## 2017-01-31 NOTE — Patient Instructions (Signed)
1. Increase lansoprazole 30 mg twice daily before breakfast and evening meal.  Prescription sent to your pharmacy. 2. Call in 2-3 weeks if you have ongoing difficulty swallowing, at that time would offer barium esophagram. 3. Let me know if your upper abdominal discomfort continues even after increasing lansoprazole to twice daily. 4. Otherwise will see you back in 6 months

## 2017-01-31 NOTE — Progress Notes (Signed)
Recall changed  

## 2017-01-31 NOTE — Progress Notes (Signed)
CC'ED TO PCP 

## 2017-01-31 NOTE — Progress Notes (Addendum)
Please NIC for EGD for h/o Barrett's in 10/2019. There is an old NIC in the system for EGD in 2020 but this can be deleted.

## 2017-01-31 NOTE — Assessment & Plan Note (Signed)
Previous well documented Barrett's without dysplasia, most recent EGD with reflux esophagitis reported only.  We will still offer patient upper endoscopy in 3 years per protocol guidelines.

## 2017-01-31 NOTE — Progress Notes (Signed)
Primary Care Physician: [name], MD  Primary Gastroenterologist:  Garfield Cornea, MD   Chief Complaint  Patient presents with  . Dysphagia    pp f/u also    HPI: [name] is a 66 y.o. 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  EGD performed for dysphagia and iron deficiency anemia.  Reflux esophagitis found.  A 2 x 2 centimeter tongue of salmon epithelium seen again consistent with prior diagnosis of Barrett's esophagus.  Small hiatal hernia present.  Status post hemigastrectomy.  Single patent efferent small bowel limb appeared normal.  Esophagus was dilated up to 58 [place] Maloney dilator.  Esophageal biopsy with reflux changes, no noted Barrett's although this was confirmed on prior endoscopies.  Colonoscopy performed for high risk colon cancer surveillance given personal history of colon cancer and iron deficiency anemia.  He had evidence of subtotal colectomy with normal-appearing residual lower GI tract.  The colonoscopy planned in 5 years.  Since his last visit he was in the ED with left upper quadrant pain.  CT abdomen and pelvis with contrast obtained on October 2 with subcentimeter hypodensity within the left hepatic lobe, too small to characterize but stable from March 2018.  Intra-and extrahepatic biliary dilation stable, likely postcholecystectomy changes.  Pancreatic duct mildly prominent measuring up to 3 mm also stable from March.  Labs while in the ED including CBC, C met, lipase, troponin, PT/INR performed, creatinine was elevated at 1.52 and stable but otherwise labs unremarkable.  Patient presents today with complaints of dysphagia which is ongoing at least 2-3 times per week, does not really matter what he eats.  He feels a  lump in the middle of his chest when no vomiting.  Some days he does fine.  75% improved after esophageal dilation.  He notes that he took lansoprazole 30 mg twice daily for about a month after his procedure but really did not see a change in his symptoms so he went back to once daily.  He did have reflux esophagitis.  He continues to have some early satiety in the setting of partial gastrectomy.  Continues to have intermittent left upper quadrant pain described as stabbing in quality, may last for a day.  Usually not brought on by any meals.  Does not do anything to make it go away.  Not associated with any vomiting, bowel change.  Bowel function is normal.  No blood in the stool or melena.   Current Outpatient Medications  Medication Sig Dispense Refill  . albuterol (PROAIR HFA) 108 (90 BASE) MCG/ACT inhaler Inhale 2 puffs into the lungs every 4 (four) hours as needed for wheezing or shortness of breath.     [name] atorvastatin (LIPITOR) 20 MG tablet Take 20 mg by mouth at bedtime.     . busPIRone (BUSPAR) 7.5 MG tablet Take 7.5 mg by mouth 2 (two) times daily.    . carvedilol (COREG) 12.5 MG tablet Take 12.5 mg by mouth 2 (two) times daily with a meal.    . Cholecalciferol (VITAMIN D) 2000 units CAPS Take 2,000 Units by mouth daily.    . Copper Gluconate 2 MG CAPS Take 1 capsule by mouth daily. (Patient taking differently: Take 2 mg by mouth 2 (two) times daily. ) 30 capsule 5  .  cyanocobalamin (,VITAMIN B-12,) 1000 MCG/ML injection Inject 1,000 mcg into the muscle every 30 (thirty) days.    [name] docusate sodium (COLACE) 100 MG capsule Take 200 mg by mouth daily as needed for mild constipation.     . enoxaparin (LOVENOX) 60 MG/0.6ML injection Inject 60 mg into the skin at bedtime.     . ferrous sulfate 325 (65 FE) MG tablet Take 1 tablet (325 mg total) by mouth 2 (two) times daily with a meal. 60 tablet 0  . lansoprazole (PREVACID) 30 MG capsule Take 1 capsule (30 mg total) 2 (two) times daily before a  meal by mouth. 180 capsule 1  . levETIRAcetam (KEPPRA) 750 MG tablet Take 750 mg by mouth 2 (two) times daily.    [name] lidocaine-prilocaine (EMLA) cream Apply 1 application topically as needed (prior to accessing port).    . LORazepam (ATIVAN) 1 MG tablet Take 1 tablet (1 mg total) by mouth every 8 (eight) hours as needed for anxiety. 30 tablet 0  . magnesium oxide (MAGNESIUM-OXIDE) 400 (241.3 Mg) MG tablet Take 400 mg by mouth at bedtime.     . Multiple Vitamin (MULTIVITAMIN WITH MINERALS) TABS tablet Take 1 tablet by mouth daily.    . Nutritional Supplements (ENSURE PLUS HN) LIQD Take 1 Bottle daily by mouth.     . ondansetron (ZOFRAN ODT) 4 MG disintegrating tablet Take 1 tablet (4 mg total) by mouth every 8 (eight) hours as needed for nausea or vomiting. 20 tablet 0  . ondansetron (ZOFRAN) 4 MG tablet Take 4 mg by mouth every 8 (eight) hours as needed for nausea or vomiting.    . polyethylene glycol (MIRALAX / GLYCOLAX) packet Take 17 g by mouth daily. (Patient taking differently: Take 17 g by mouth daily as needed for mild constipation. ) 14 each 0  . Probiotic Product (PROBIOTIC PO) Take 1 tablet by mouth daily.    . prochlorperazine (COMPAZINE) 10 MG tablet Take 10 mg by mouth every 6 (six) hours as needed for nausea or vomiting.     . promethazine (PHENERGAN) 12.5 MG tablet Take 1 tablet (12.5 mg total) by mouth every 6 (six) hours as needed for nausea or vomiting. 20 tablet 0  . sucralfate (CARAFATE) 1 GM/10ML suspension Take 10 mLs (1 g total) by mouth 4 (four) times daily -  with meals and at bedtime. 420 mL 0  . tamsulosin (FLOMAX) 0.4 MG CAPS capsule Take 0.4 mg by mouth at bedtime.     . traMADol (ULTRAM) 50 MG tablet Take 1 tablet (50 mg total) by mouth every 6 (six) hours as needed. 15 tablet 0  . Vitamin D, Ergocalciferol, (DRISDOL) 50000 units CAPS capsule Take 50,000 Units by mouth every 30 (thirty) days.     . zoledronic acid (RECLAST) 5 MG/100ML SOLN injection Inject 5 mg into the  vein See admin instructions. Pt gets a dose yearly. Due again 2018     No current facility-administered medications for this visit.    Facility-Administered Medications Ordered in Other Visits  Medication Dose Route Frequency Provider Last Rate Last Dose  . heparin lock flush 100 unit/mL  500 Units Intravenous Once Kefalas, Thomas S, PA-C      . sodium chloride flush (NS) 0.9 % injection 10 mL  10 mL Intravenous PRN Holley Bouche, NP   10 mL at 11/05/16 1136    Allergies as of 01/31/2017  . (No Known Allergies)   Past Medical History:  Diagnosis Date  . 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 Anemia   . Anxiety   . Arthritis   . Barrett's esophagus   . Blood transfusion   . Bowel obstruction ([place]) 05/13/2012   Recurrent  . Bronchitis   . Chest pain at rest   . Chronic abdominal pain   . Erosive esophagitis   . ETOH abuse    quit 03/2010  . GERD (gastroesophageal reflux disease)   . Hx of Clostridium difficile infection 01/2012  . Hypertension   . Ileus ([place])   . Iron deficiency anemia 03/23/2016  . Obstruction of bowel ([place]) 03/03/14  . Osteoporosis   . Personal history of PE (pulmonary embolism) 10/01/2010  . Pneumonia   . Pulmonary embolism ([place]) 02/2010  . Recurrent upper respiratory infection (URI)   . S/P endoscopy September 28, 2010   erosive reflux esophagitis, Billroth I anatomy  . 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 Seizures ([address])   . Shortness of breath   . TIA (transient ischemic attack) 10/11  . Vitamin B12 deficiency    Past Surgical History:  Procedure Laterality Date  . ABDOMINAL ADHESION SURGERY  03/04/15   @ [facility]  . ABDOMINAL EXPLORATION SURGERY    . abdominal sugery     for bowel obstruction x 8, all in 1980s, except for one in 07/2010  . APPENDECTOMY  1980s  . Billroth 1 hemigastrectomy  1980s   per patient for benign duodenal tumor  . CARDIAC CATHETERIZATION  07/17/2012  . CHOLECYSTECTOMY  1980s  . COLON SURGERY  May  2012   left hemicolectomy, colon cancer found at time of surgery for bowel obstruction  . HERNIA REPAIR     right inguinal  . PORTACATH PLACEMENT       ROS:  General: Negative for anorexia, weight loss, fever, chills, fatigue, weakness. ENT: Negative for hoarseness, difficulty swallowing , nasal congestion. CV: Negative for chest pain, angina, palpitations, dyspnea on exertion, peripheral edema.  Respiratory: Negative for dyspnea at rest, dyspnea on exertion, cough, sputum, wheezing.  GI: See history of present illness. GU:  Negative for dysuria, hematuria, urinary incontinence, urinary frequency, nocturnal urination.  Endo: Negative for unusual weight change.    Physical Examination:   BP (!) 142/91   Pulse 74   Temp (!) 96.9 F (36.1 C) (Oral)   Ht '5\' 9"'  (1.753 m)   Wt 159 lb 3.2 oz (72.2 kg)   BMI 23.51 kg/m   General: Well-nourished, well-developed in no acute distress.  Eyes: No icterus. Mouth: Oropharyngeal mucosa moist and pink , no lesions erythema or exudate. Lungs: Clear to auscultation bilaterally.  Heart: Regular rate and rhythm, no murmurs rubs or gallops.  Abdomen: Bowel sounds are normal, nontender, nondistended, no hepatosplenomegaly or masses, no abdominal bruits or hernia , no rebound or guarding.   Extremities: No lower extremity edema. No clubbing or deformities. Neuro: Alert and oriented x 4   Skin: Warm and dry, no jaundice.   Psych: Alert and cooperative, normal mood and affect.  Labs:  Lab Results  Component Value Date   IRON 106 01/14/2017   TIBC 374 01/14/2017   FERRITIN 27 01/14/2017   Lab Results  Component Value Date   CREATININE 1.40 (H) 01/14/2017   BUN 11 01/14/2017   NA 142 01/14/2017   K 3.7 01/14/2017   CL 109 01/14/2017   CO2 22 01/14/2017   Lab Results  Component Value Date   WBC 9.2 01/14/2017   HGB 15.4 01/14/2017  HCT 46.3 01/14/2017   MCV 94.7 01/14/2017   PLT 176 01/14/2017   Lab Results  Component Value Date     INR 0.96 12/21/2016   INR 0.97 06/07/2011   INR 1.04 05/27/2011   Lab Results  Component Value Date   ALT 26 12/21/2016   AST 27 12/21/2016   ALKPHOS 98 12/21/2016   BILITOT 0.8 12/21/2016    Imaging Studies:  CLINICAL DATA:  Initial evaluation for acute left upper quadrant abdominal pain.  EXAM: CT ABDOMEN AND PELVIS WITH CONTRAST  TECHNIQUE: Multidetector CT imaging of the abdomen and pelvis was performed using the standard protocol following bolus administration of intravenous contrast.  CONTRAST:  144m ISOVUE-300 IOPAMIDOL (ISOVUE-300) INJECTION 61%  COMPARISON:  Prior radiograph from 09/15/2016 as well as CT from 06/11/2016.  FINDINGS: Lower chest: Mild scattered atelectatic changes present within the visualized lung bases. Visualized lungs are otherwise clear.  Hepatobiliary: Subcentimeter hypodensity within left hepatic lobe noted, too small the characterize, but stable from previous and of doubtful significance. Liver otherwise unremarkable. Gallbladder surgically absent. Intra and extrahepatic biliary dilatation like related to post cholecystectomy changes, similar to previous.  Pancreas: Pancreas demonstrates no acute inflammatory changes or mass lesion. Pancreatic duct mildly prominent measuring up to 3 mm, also similar from previous.  Spleen: Spleen within normal limits.  Adrenals/Urinary Tract: Adrenal glands are normal. Kidneys equal in size with symmetric enhancement. Right renal cysts are stable from previous. 4 mm nonobstructive left renal calculus also stable. Few punctate nonobstructive right renal calculi noted as well. No hydronephrosis. No focal enhancing renal mass. No hydroureter. Partially distended bladder within normal limits.  Stomach/Bowel: Sequelae of prior partial gastrectomy and gastrojejunostomy again noted. No evidence for obstruction. Postsurgical changes within the lower mid pelvis also unchanged. Appendix not  visualize, consistent with history prior appendectomy. No acute inflammatory changes seen about the bowels.  Vascular/Lymphatic: Moderate aortic atherosclerosis. No aneurysm. Normal intravascular enhancement seen throughout the intra-abdominal aorta and its branch vessels. Few mildly prominent subcentimeter mesenteric lymph nodes noted within left ab no, similar to previous. No pathologically enlarged intra-abdominal or pelvic lymph nodes identified.  Reproductive: Prostate mildly enlarged measuring 4.9 cm in transverse diameter.  Other: No free air or fluid.  Musculoskeletal: No acute osseous abnormality. No worrisome lytic or blastic osseous lesions.  IMPRESSION: 1. No CT evidence for acute intra-abdominal or pelvic process. 2. Nonobstructive bilateral nephrolithiasis. 3. Stable postsurgical changes from prior partial gastrectomy and gastrojejunostomy without complication. 4. Aortic atherosclerosis.   Electronically Signed   By: BJeannine BogaM.D.   On: 12/21/2016 17:49

## 2017-01-31 NOTE — Assessment & Plan Note (Signed)
Patient with persistent esophageal dysphagia status post esophageal dilation although he admits to being about 75% improved.  Continues to have symptoms 2-3 times per week.  On endoscopy he was noted to have reflux esophagitis which may not be adequately controlled.  He had increase lansoprazole to twice daily for about 4 weeks only.  Offered barium pill esophagram today.  Patient would like to increase his lansoprazole to twice daily to see if this will help his dysphagia.  New prescription sent to his pharmacy.  If no significant improvement in 2-3 weeks, he will let me know we will plan on barium pill esophagram.  Also see if his left upper quadrant pain improves although suspect this may be multifactorial in the setting of multiple abdominal surgery/adhesions.

## 2017-02-04 ENCOUNTER — Other Ambulatory Visit (HOSPITAL_COMMUNITY): Payer: Medicare Other

## 2017-02-04 ENCOUNTER — Other Ambulatory Visit: Payer: Self-pay

## 2017-02-04 ENCOUNTER — Ambulatory Visit (HOSPITAL_COMMUNITY): Payer: Medicare Other

## 2017-02-04 ENCOUNTER — Encounter (HOSPITAL_COMMUNITY): Payer: Medicare Other | Attending: Adult Health | Admitting: Oncology

## 2017-02-04 ENCOUNTER — Emergency Department (HOSPITAL_COMMUNITY)
Admission: EM | Admit: 2017-02-04 | Discharge: 2017-02-04 | Disposition: A | Discharge status: Home / Self Care | Payer: Medicare Other | Attending: Emergency Medicine | Admitting: Emergency Medicine

## 2017-02-04 ENCOUNTER — Encounter (HOSPITAL_COMMUNITY): Payer: Self-pay | Admitting: Oncology

## 2017-02-04 ENCOUNTER — Emergency Department (HOSPITAL_COMMUNITY): Payer: Medicare Other

## 2017-02-04 ENCOUNTER — Encounter (HOSPITAL_COMMUNITY): Payer: Self-pay

## 2017-02-04 VITALS — BP 166/89 | HR 83 | Temp 98.2°F | Resp 16 | Ht 69.0 in | Wt 161.4 lb

## 2017-02-04 DIAGNOSIS — R5382 Chronic fatigue, unspecified: Secondary | ICD-10-CM

## 2017-02-04 DIAGNOSIS — I259 Chronic ischemic heart disease, unspecified: Secondary | ICD-10-CM | POA: Diagnosis not present

## 2017-02-04 DIAGNOSIS — Z85038 Personal history of other malignant neoplasm of large intestine: Secondary | ICD-10-CM

## 2017-02-04 DIAGNOSIS — I5032 Chronic diastolic (congestive) heart failure: Secondary | ICD-10-CM | POA: Diagnosis not present

## 2017-02-04 DIAGNOSIS — Z87891 Personal history of nicotine dependence: Secondary | ICD-10-CM | POA: Diagnosis not present

## 2017-02-04 DIAGNOSIS — R079 Chest pain, unspecified: Secondary | ICD-10-CM | POA: Diagnosis present

## 2017-02-04 DIAGNOSIS — Z86711 Personal history of pulmonary embolism: Secondary | ICD-10-CM | POA: Insufficient documentation

## 2017-02-04 DIAGNOSIS — N183 Chronic kidney disease, stage 3 (moderate): Secondary | ICD-10-CM | POA: Diagnosis not present

## 2017-02-04 DIAGNOSIS — D508 Other iron deficiency anemias: Secondary | ICD-10-CM

## 2017-02-04 DIAGNOSIS — I130 Hypertensive heart and chronic kidney disease with heart failure and stage 1 through stage 4 chronic kidney disease, or unspecified chronic kidney disease: Secondary | ICD-10-CM | POA: Diagnosis not present

## 2017-02-04 DIAGNOSIS — Z7901 Long term (current) use of anticoagulants: Secondary | ICD-10-CM | POA: Diagnosis not present

## 2017-02-04 DIAGNOSIS — C189 Malignant neoplasm of colon, unspecified: Secondary | ICD-10-CM

## 2017-02-04 DIAGNOSIS — R0789 Other chest pain: Secondary | ICD-10-CM | POA: Diagnosis not present

## 2017-02-04 DIAGNOSIS — Z79899 Other long term (current) drug therapy: Secondary | ICD-10-CM | POA: Diagnosis not present

## 2017-02-04 DIAGNOSIS — D509 Iron deficiency anemia, unspecified: Secondary | ICD-10-CM | POA: Insufficient documentation

## 2017-02-04 DIAGNOSIS — Z452 Encounter for adjustment and management of vascular access device: Secondary | ICD-10-CM | POA: Insufficient documentation

## 2017-02-04 LAB — BASIC METABOLIC PANEL
ANION GAP: 7 (ref 5–15)
BUN: 8 mg/dL (ref 6–20)
CO2: 24 mmol/L (ref 22–32)
Calcium: 8.2 mg/dL — ABNORMAL LOW (ref 8.9–10.3)
Chloride: 109 mmol/L (ref 101–111)
Creatinine, Ser: 1.45 mg/dL — ABNORMAL HIGH (ref 0.61–1.24)
GFR, EST AFRICAN AMERICAN: 57 mL/min — AB (ref >60)
GFR, EST NON AFRICAN AMERICAN: 49 mL/min — AB (ref >60)
GLUCOSE: 86 mg/dL (ref 65–99)
POTASSIUM: 3.8 mmol/L (ref 3.5–5.1)
SODIUM: 140 mmol/L (ref 135–145)

## 2017-02-04 LAB — CBC WITH DIFFERENTIAL/PLATELET
BASOS PCT: 0 %
Basophils Absolute: 0 10*3/uL (ref 0.0–0.1)
EOS ABS: 0.1 10*3/uL (ref 0.0–0.7)
Eosinophils Relative: 1 %
HCT: 43.3 % (ref 39.0–52.0)
HEMOGLOBIN: 14.5 g/dL (ref 13.0–17.0)
Lymphocytes Relative: 22 %
Lymphs Abs: 2.1 10*3/uL (ref 0.7–4.0)
MCH: 30.6 pg (ref 26.0–34.0)
MCHC: 33.5 g/dL (ref 30.0–36.0)
MCV: 91.4 fL (ref 78.0–100.0)
MONO ABS: 1 10*3/uL (ref 0.1–1.0)
MONOS PCT: 11 %
NEUTROS PCT: 66 %
Neutro Abs: 6.2 10*3/uL (ref 1.7–7.7)
Platelets: 151 10*3/uL (ref 150–400)
RBC: 4.74 MIL/uL (ref 4.22–5.81)
RDW: 13.3 % (ref 11.5–15.5)
WBC: 9.5 10*3/uL (ref 4.0–10.5)

## 2017-02-04 LAB — TROPONIN I

## 2017-02-04 LAB — D-DIMER, QUANTITATIVE (NOT AT ARMC)

## 2017-02-04 MED ORDER — HEPARIN SOD (PORK) LOCK FLUSH 100 UNIT/ML IV SOLN
INTRAVENOUS | Status: AC
  Administered 2017-02-04: 500 [IU]
  Filled 2017-02-04: qty 5

## 2017-02-04 MED ORDER — ASPIRIN 81 MG PO CHEW
324.0000 mg | CHEWABLE_TABLET | Freq: Once | ORAL | Status: AC
  Administered 2017-02-04: 324 mg via ORAL
  Filled 2017-02-04: qty 4

## 2017-02-04 MED ORDER — ONDANSETRON HCL 4 MG/2ML IJ SOLN
4.0000 mg | Freq: Once | INTRAMUSCULAR | Status: AC | PRN
  Administered 2017-02-04: 4 mg via INTRAVENOUS
  Filled 2017-02-04: qty 2

## 2017-02-04 NOTE — ED Triage Notes (Signed)
Pt c/o left sided chest pain that woke him up at 3 am.  Says pain radiates to left arm and intensity varies.  Pt also reports sob and nausea.

## 2017-02-04 NOTE — Progress Notes (Signed)
[name] Cipro, MD [address] [place] [id number]  No diagnosis found.  CURRENT THERAPY: Surveillance per NCCN guidelines.  IV iron when indicated  INTERVAL HISTORY: ARCH METHOT 67 y.o. male returns for followup of Iron deficiency anemia, requiring IV iron replacement when indicated.  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 Geroge Baseman on 07/31/2010 followed by FOLFOX x 7/12 cycles ending on 01/04/2011 due to progressive peripheral neuropathy. At time of surgery tumor was 2.5 cm, moderately differentiated, with 1 out of 18 lymph nodes positive for metastatic disease.  Post-treatment colonoscopy/EGD by Dr. [name] on 05/15/2015 was normal and repeat is recommended in 2022.  He has completed 5 years worth of surveillance in accordance with the NCCN guidelines as of 2017. AND Recurrent SBOs, secondary to abdominal adhesions following multiple abdominal surgeries in the past AND H/O EtOHism  Patient presents today for continue follow-up.  He states that he has chronic fatigue.  He states that he has had left-sided chest pain since around 3 AM this morning.  It is constant and nonradiating.  It is exacerbated by deep inspiration.  He states the pain is 5 out of 10 in severity and has stayed at the same pain level since 3 AM this morning.  He denies any radiation down to his left arm or anywhere else.  He denies any falls with his bowels, denies any melena, hematochezia, hematuria.  He denies any shortness of breath, leg swelling, palpitations, abdominal pain, nausea vomiting, or diarrhea.  He denies any focal weakness or dizziness.  No recent infections.     Review of Systems  Constitutional: Negative for chills, fever, malaise/fatigue and weight loss.  HENT: Negative.   Eyes: Negative.   Respiratory: Negative.  Negative for cough.   Cardiovascular: Negative.  Negative for chest pain.    Gastrointestinal: Negative for abdominal pain, blood in stool, constipation, diarrhea, melena, nausea and vomiting.  Genitourinary: Negative.   Musculoskeletal: Negative.   Skin: Negative.   Neurological: Negative for dizziness, sensory change and weakness.  Endo/Heme/Allergies: Negative.   Psychiatric/Behavioral: Negative.     Past Medical History:  Diagnosis Date  . 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 Anemia   . Anxiety   . Arthritis   . Barrett's esophagus   . Blood transfusion   . Bowel obstruction ([place]) 05/13/2012   Recurrent  . Bronchitis   . Chest pain at rest   . Chronic abdominal pain   . Erosive esophagitis   . ETOH abuse    quit 03/2010  . GERD (gastroesophageal reflux disease)   . Hx of Clostridium difficile infection 01/2012  . Hypertension   . Ileus ([place])   . Iron deficiency anemia 03/23/2016  . Obstruction of bowel ([place]) 03/03/14  . Osteoporosis   . Personal history of PE (pulmonary embolism) 10/01/2010  . Pneumonia   . Pulmonary embolism ([place]) 02/2010  . Recurrent upper respiratory infection (URI)   . S/P endoscopy September 28, 2010   erosive reflux esophagitis, Billroth I anatomy  . 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 Seizures ([place])   . Shortness of breath   . TIA (transient ischemic attack) 10/11  . Vitamin B12 deficiency     Past Surgical History:  Procedure Laterality Date  . ABDOMINAL ADHESION SURGERY  03/04/15   @ [facility]  . ABDOMINAL EXPLORATION SURGERY    .  abdominal sugery     for bowel obstruction x 8, all in 1980s, except for one in 07/2010  . APPENDECTOMY  1980s  . Billroth 1 hemigastrectomy  1980s   per patient for benign duodenal tumor  . CARDIAC CATHETERIZATION  07/17/2012  . CHOLECYSTECTOMY  1980s  . COLON SURGERY  May 2012   left hemicolectomy, colon cancer found at time of surgery for bowel obstruction  . COLONOSCOPY N/A 05/15/2015   Performed by [name], MD at [place]  . COLONOSCOPY N/A  07/24/2012   Performed by [name], MD at [place]  . COLONOSCOPY N/A 03/18/2011   Performed by [name], MD at [place]  . COLONOSCOPY WITH PROPOFOL N/A 11/04/2016   Performed by [name], MD at [place] [facility]  . ESOPHAGOGASTRODUODENOSCOPY (EGD) N/A 05/15/2015   Performed by [name], MD at [place]  . ESOPHAGOGASTRODUODENOSCOPY (EGD) N/A 06/03/2014   Performed by [name], MD at [place]  . ESOPHAGOGASTRODUODENOSCOPY (EGD) N/A 04/16/2011   Performed by [name], MD at [place]  . ESOPHAGOGASTRODUODENOSCOPY (EGD) N/A 12/01/2010   Performed by [name], MD at [place]  . ESOPHAGOGASTRODUODENOSCOPY (EGD) N/A 09/28/2010   Performed by [name], MD at [place]  . ESOPHAGOGASTRODUODENOSCOPY (EGD) WITH ESOPHAGEAL DILATION N/A 02/25/2012   Performed by [name], MD at [place]  . ESOPHAGOGASTRODUODENOSCOPY (EGD) WITH PROPOFOL N/A 11/04/2016   Performed by [name], MD at [place] [facility]  . HERNIA REPAIR     right inguinal  . MALONEY DILATION N/A 11/04/2016   Performed by [name], MD at [place]  . MALONEY DILATION N/A 05/15/2015   Performed by [name], MD at [place]  . MALONEY DILATION N/A 06/03/2014   Performed by [name], MD at [place]  . PORTACATH PLACEMENT    . SAVORY DILATION N/A 06/03/2014   Performed by [name], MD at [place]    Family History  Problem Relation Age of Onset  . Hypertension Mother   . Arthritis Mother   . Pneumonia Mother   . Hypertension Father   . Heart attack Father   . Colon cancer Neg Hx     Social History   Socioeconomic History  . Marital status: Married    Spouse name: Not on file  . Number of children: 3  . Years of education: Not on file  . Highest education level: Not on file  Social Needs  . Financial resource strain: Not on file  . Food insecurity - worry: Not on file  . Food insecurity -  inability: Not on file  . Transportation needs - medical: Not on file  . Transportation needs - non-medical: Not on file  Occupational History    Employer: [employer]  Tobacco Use  . Smoking status: Former Smoker    Packs/day: 0.50    Years: 40.00    Pack years: 20.00    Types: Cigarettes    Last attempt to quit: 12/20/2012    Years since quitting: 4.1  . Smokeless tobacco: Never Used  Substance and Sexual Activity  . Alcohol use: No  . Drug use: No  . Sexual activity: No  Other Topics Concern  . Not on file  Social History Narrative  . Not on file  PHYSICAL EXAMINATION  ECOG PERFORMANCE STATUS: 1 - Symptomatic but completely ambulatory Blood pressure 166/89, pulse 83, rest rate 16, temperature 98.2, O2 sat 99%.  Height 5 foot 9 inches, weight 161 lbs  GENERAL:alert, no distress, well nourished, well developed, comfortable, cooperative, smiling and unaccompanied SKIN: skin color, texture, turgor are normal, no rashes or significant lesions HEAD: Normocephalic, No masses, lesions, tenderness or abnormalities EYES: normal, EOMI, Conjunctiva are pink and non-injected EARS: External ears normal OROPHARYNX:lips, buccal mucosa, and tongue normal and mucous membranes are moist  NECK: supple, trachea midline LYMPH:  no palpable lymphadenopathy BREAST:not examined LUNGS: clear to auscultation and percussion HEART: regular rate & rhythm, no murmurs and no gallops ABDOMEN:abdomen soft, non-tender, normal bowel sounds and nondistended BACK: Back symmetric, no curvature. EXTREMITIES:less then 2 second capillary refill, no joint deformities, effusion, or inflammation, no edema, no skin discoloration, no clubbing, no cyanosis  NEURO: alert & oriented x 3 with fluent speech, no focal motor/sensory deficits, gait normal   LABORATORY DATA: CBC    Component Value Date/Time   WBC 9.2 01/14/2017 1314   RBC 4.89 01/14/2017 1314   HGB 15.4 01/14/2017 1314   HCT 46.3 01/14/2017  1314   PLT 176 01/14/2017 1314   MCV 94.7 01/14/2017 1314   MCH 31.5 01/14/2017 1314   MCHC 33.3 01/14/2017 1314   RDW 13.7 01/14/2017 1314   LYMPHSABS 2.3 01/14/2017 1314   MONOABS 0.8 01/14/2017 1314   EOSABS 0.2 01/14/2017 1314   BASOSABS 0.0 01/14/2017 1314      Chemistry      Component Value Date/Time   NA 142 01/14/2017 1314   K 3.7 01/14/2017 1314   CL 109 01/14/2017 1314   CO2 22 01/14/2017 1314   BUN 11 01/14/2017 1314   CREATININE 1.40 (H) 01/14/2017 1314   CREATININE 1.60 (H) 06/11/2016 1221      Component Value Date/Time   CALCIUM 8.6 (L) 01/14/2017 1314   ALKPHOS 98 12/21/2016 1543   AST 27 12/21/2016 1543   ALT 26 12/21/2016 1543   BILITOT 0.8 12/21/2016 1543     Lab Results  Component Value Date   IRON 106 01/14/2017   TIBC 374 01/14/2017   FERRITIN 27 01/14/2017    PENDING LABS:   RADIOGRAPHIC STUDIES:  No results found.   PATHOLOGY:    ASSESSMENT AND PLAN:  1. 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 Geroge Baseman on 07/31/2010 followed by FOLFOX x 7/12 cycles ending on 01/04/2011 due to progressive peripheral neuropathy. At time of surgery tumor was 2.5 cm, moderately differentiated, with 1 out of 18 lymph nodes positive for metastatic disease.  Post-treatment colonoscopy/EGD by Dr. [name] on 05/15/2015 was normal and repeat is recommended in 2022.   -He has completed 5 years worth of surveillance in accordance with the NCCN guidelines as of 2017. -No further evaluation needed. His last colonoscopy was in 10/2016, continue routine colonoscopy screening with GI in 5 years per GI recommendations.   2. Iron deficiency anemia -Last feraheme on 07/30/16 -Patient is currently not anemic at this time. Most recent blood work from 3 weeks ago demonstrated a hemoglobin of 15 g/dL with normal iron studies. -Repeat labs in 3 months and 6 months.   3. Chest pain -Advised patient to go to the ED  immediately after this visit for further workup to rule out MI, PE. He verbalized understanding.  Follow up in 6 months.  ORDERS PLACED FOR  THIS ENCOUNTER: Orders Placed This Encounter  Procedures  . CBC with Differential  . Comprehensive metabolic panel  . Iron and TIBC  . Ferritin     THERAPY PLAN:  We will continue to monitor his iron and provide IV iron when indicated.  He has completed 5 years worth of surveillance for his colorectal cancer diagnosed in 2012.  All questions were answered. The patient knows to call the clinic with any problems, questions or concerns. We can certainly see the patient much sooner if necessary.   This note is electronically signed by: Twana First, MD 02/04/2017 2:03 PM

## 2017-02-04 NOTE — Discharge Instructions (Signed)
Take all of the medicines that have been prescribed to you exactly as prescribed ER for worsening symtpoms.  It does not appear that you are having a heart attack or a blood clot today.

## 2017-02-04 NOTE — ED Provider Notes (Signed)
[facility] EMERGENCY DEPARTMENT Provider Note   CSN: [id number] Arrival date & time: 02/04/17  1432     History   Chief Complaint Chief Complaint  Patient presents with  . Chest Pain    HPI [name] is a 66 y.o. male.  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  It was at that time in 2012 that the patient was also diagnosed with pulmonary embolus and has been on Lovenox injections ever since.  He reports that he has not missed any of his injections, he has been doing well until he developed left-sided chest pain at 3:00 in the morning.  This pain awoke him from his sleep, was acute in onset, it has been persistent for the last 12 hours and though it waxes and wanes in intensity it has not left.  He reports that it is worse with taking a deep breath and associated with a mild cough however he denies any fevers, swelling of the legs, he has not had surgery, that he does not smoke cigarettes and has not smoked in 4 years, he has not had any travel or immobilization.  He continues to take the Lovenox despite not having blood clots in over 6 years.  He also reports having a stress test in June at which time he was told that he did not have any blockages.  He has never had exertional symptoms.  His chest pain is not positional or related to eating.  He has had some shortness of breath which is mild but denies any exertional shortness of breath.  He has had no radiation of his pain to his arm, neck, jaw, shoulders or back.  He reports that his left arm felt a little weird earlier today but that has gone away.  He has never had cardiac ischemic disease.  He has been diagnosed with congestive heart failure  Risk factors for heart disease include hypertension, hypercholesterolemia, prior smoker.  The patient is not a diabetic.  Echocardiogram performed on  March 20, 2016 shows an ejection fraction of 55-60%, no left ventricular hypertrophy, abnormal left ventricular relaxation with grade 1 diastolic dysfunction.  The patient's primary cardiologist is in [place], we do not have access to his stress test report which she reports was in June of this year   Past Medical History:  Diagnosis Date  . 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 Anemia   . Anxiety   . Arthritis   . Barrett's esophagus   . Blood transfusion   . Bowel obstruction ([place]) 05/13/2012   Recurrent  . Bronchitis   . Chest pain at rest   . Chronic abdominal pain   . Erosive esophagitis   . ETOH abuse    quit 03/2010  . GERD (gastroesophageal reflux disease)   . Hx of Clostridium difficile infection 01/2012  . Hypertension   . Ileus ([place])   . Iron deficiency anemia 03/23/2016  . Obstruction of bowel ([place]) 03/03/14  . Osteoporosis   . Personal history of PE (pulmonary embolism) 10/01/2010  . Pneumonia   . Pulmonary embolism ([place]) 02/2010  . Recurrent upper respiratory infection (URI)   . S/P endoscopy September 28, 2010   erosive reflux esophagitis, Billroth I anatomy  . 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 Seizures ([place])   . Shortness of  breath   . TIA (transient ischemic attack) 10/11  . Vitamin B12 deficiency     Patient Active Problem List   Diagnosis Date Noted  . Seizure disorder ([place]) 09/16/2016  . CKD (chronic kidney disease) stage 3, GFR 30-59 ml/min (HCC) 06/14/2016  . Patient has nasogastric tube   . Ileus ([place]) 05/15/2016  . Chronic diastolic heart failure ([place]) 05/15/2016  . Iron deficiency anemia 03/23/2016  . Acute renal failure ([place]) 03/19/2016  . Normocytic anemia 03/19/2016  . Hypotension 03/19/2016  . Anemia   . History of colon cancer, stage III   . Hx of colon cancer, stage III   . History of colonic polyps   . Barrett's esophagus without dysplasia   . Dysphagia   . Essential hypertension 11/30/2014  .  Dysphagia, pharyngoesophageal phase   . Mucosal abnormality of esophagus   . SBO (small bowel obstruction) ([place]) 09/17/2013  . Rectal bleeding 07/11/2012  . Bowel obstruction ([place]) 07/11/2012  . Abnormal CT scan, colon 07/11/2012  . Esophageal dysphagia 02/22/2012  . H/O Clostridium difficile infection 02/22/2012  . Diarrhea 09/10/2011  . Cellulitis 09/10/2011  . Nausea 06/07/2011  . Abdominal distention 06/07/2011  . HTN (hypertension) 06/07/2011  . Partial small bowel obstruction ([place]) 05/31/2011  . GI bleed 05/27/2011  . Coagulopathy ([place]) 05/27/2011  . Gastroenteritis 05/27/2011  . Small bowel obstruction ([place]) 05/13/2011  . LUQ pain 05/13/2011  . Pulmonary embolism ([place]) 01/17/2011  . Chest pain at rest 01/17/2011  . Pneumonia 12/11/2010  . Hematemesis 12/05/2010  . Chronic abdominal pain 12/05/2010  . Pancytopenia due to antineoplastic chemotherapy ([place]) 12/05/2010  . Coffee ground emesis 11/29/2010  . Esophageal reflux disease 11/15/2010  . 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 Personal history of PE (pulmonary embolism) 10/01/2010  . Bronchitis, acute 10/01/2010  . Erosive esophagitis 10/01/2010  . Fever chills 09/29/2010  . Bleeding gastrointestinal 09/26/2010  . Supratherapeutic INR 09/26/2010  . Abdominal pain 09/26/2010  . Nausea and vomiting 09/26/2010  . Anemia, chronic disease 09/26/2010  . Adenocarcinoma of colon with mucinous features 09/22/2010    Past Surgical History:  Procedure Laterality Date  . ABDOMINAL ADHESION SURGERY  03/04/15   @ [facility]  . ABDOMINAL EXPLORATION SURGERY    . abdominal sugery     for bowel obstruction x 8, all in 1980s, except for one in 07/2010  . APPENDECTOMY  1980s  . Billroth 1 hemigastrectomy  1980s   per patient for benign duodenal tumor  . CARDIAC CATHETERIZATION  07/17/2012  . CHOLECYSTECTOMY  1980s  . COLON SURGERY  May 2012   left hemicolectomy, colon cancer found at time of surgery for bowel obstruction  .  COLONOSCOPY N/A 05/15/2015   Performed by [name], MD at [place]  . COLONOSCOPY N/A 07/24/2012   Performed by [name], MD at [place]  . COLONOSCOPY N/A 03/18/2011   Performed by [name], MD at [place]  . COLONOSCOPY WITH PROPOFOL N/A 11/04/2016   Performed by [name], MD at [place]  . ESOPHAGOGASTRODUODENOSCOPY (EGD) N/A 05/15/2015   Performed by [name], MD at [place]  . ESOPHAGOGASTRODUODENOSCOPY (EGD) N/A 06/03/2014   Performed by [name], MD at [place]  . ESOPHAGOGASTRODUODENOSCOPY (EGD) N/A 04/16/2011   Performed by [name], MD at [place]  . ESOPHAGOGASTRODUODENOSCOPY (EGD) N/A 12/01/2010   Performed by [name], MD at [place]  .  ESOPHAGOGASTRODUODENOSCOPY (EGD) N/A 09/28/2010   Performed by [name], MD at [place]  . ESOPHAGOGASTRODUODENOSCOPY (EGD) WITH ESOPHAGEAL DILATION N/A 02/25/2012   Performed by [name], MD at [place]  . ESOPHAGOGASTRODUODENOSCOPY (EGD) WITH PROPOFOL N/A 11/04/2016   Performed by [name], MD at [place]  . HERNIA REPAIR     right inguinal  . MALONEY DILATION N/A 11/04/2016   Performed by [name], MD at [place]  . MALONEY DILATION N/A 05/15/2015   Performed by [name], MD at [place]  . MALONEY DILATION N/A 06/03/2014   Performed by [name], MD at [place]  . PORTACATH PLACEMENT    . SAVORY DILATION N/A 06/03/2014   Performed by [name], MD at [place] [facility]    Prior to Admission medications   Medication Sig Start Date End Date Taking? Authorizing Provider  albuterol (PROAIR HFA) 108 (90 BASE) MCG/ACT inhaler Inhale 2 puffs into the lungs every 4 (four) hours as needed for wheezing or shortness of breath.     [provider]  atorvastatin (LIPITOR) 20 MG tablet Take 20 mg by mouth at bedtime.     [provider]  busPIRone  (BUSPAR) 7.5 MG tablet Take 7.5 mg by mouth 2 (two) times daily.    [provider]  carvedilol (COREG) 12.5 MG tablet Take 12.5 mg by mouth 2 (two) times daily with a meal.    [provider]  Cholecalciferol (VITAMIN D) 2000 units CAPS Take 2,000 Units by mouth daily.    [provider]  Copper Gluconate 2 MG CAPS Take 1 capsule by mouth daily. Patient taking differently: Take 2 mg by mouth 2 (two) times daily.  05/03/16   Baird Cancer, PA-C  cyanocobalamin (,VITAMIN B-12,) 1000 MCG/ML injection Inject 1,000 mcg into the muscle every 30 (thirty) days.    [provider]  docusate sodium (COLACE) 100 MG capsule Take 200 mg by mouth daily as needed for mild constipation.     [provider]  enoxaparin (LOVENOX) 60 MG/0.6ML injection Inject 60 mg into the skin at bedtime.     [provider]  ferrous sulfate 325 (65 FE) MG tablet Take 1 tablet (325 mg total) by mouth 2 (two) times daily with a meal. 03/21/16   Thurnell Lose, MD  lansoprazole (PREVACID) 30 MG capsule Take 1 capsule (30 mg total) 2 (two) times daily before a meal by mouth. 01/31/17   Mahala Menghini, PA-C  levETIRAcetam (KEPPRA) 750 MG tablet Take 750 mg by mouth 2 (two) times daily.    [provider]  lidocaine-prilocaine (EMLA) cream Apply 1 application topically as needed (prior to accessing port).    [provider]  LORazepam (ATIVAN) 1 MG tablet Take 1 tablet (1 mg total) by mouth every 8 (eight) hours as needed for anxiety. 05/23/16   Elgergawy, Silver Huguenin, MD  magnesium oxide (MAGNESIUM-OXIDE) 400 (241.3 Mg) MG tablet Take 400 mg by mouth at bedtime.     [provider]  Multiple Vitamin (MULTIVITAMIN WITH MINERALS) TABS tablet Take 1 tablet by mouth daily.    [provider]  Nutritional Supplements (ENSURE PLUS HN) LIQD Take 1 Bottle daily by mouth.     [provider]  ondansetron (ZOFRAN ODT) 4 MG disintegrating tablet  Take 1 tablet (4 mg total) by mouth every  8 (eight) hours as needed for nausea or vomiting. 12/21/16   Long, Wonda Olds, MD  ondansetron (ZOFRAN) 4 MG tablet Take 4 mg by mouth every 8 (eight) hours as needed for nausea or vomiting.    [provider]  polyethylene glycol (MIRALAX / GLYCOLAX) packet Take 17 g by mouth daily. Patient taking differently: Take 17 g by mouth daily as needed for mild constipation.  09/19/16   Kathie Dike, MD  Probiotic Product (PROBIOTIC PO) Take 1 tablet by mouth daily.    [provider]  prochlorperazine (COMPAZINE) 10 MG tablet Take 10 mg by mouth every 6 (six) hours as needed for nausea or vomiting.     [provider]  promethazine (PHENERGAN) 12.5 MG tablet Take 1 tablet (12.5 mg total) by mouth every 6 (six) hours as needed for nausea or vomiting. 09/19/16   Kathie Dike, MD  sucralfate (CARAFATE) 1 GM/10ML suspension Take 10 mLs (1 g total) by mouth 4 (four) times daily -  with meals and at bedtime. 12/21/16   Long, Wonda Olds, MD  tamsulosin (FLOMAX) 0.4 MG CAPS capsule Take 0.4 mg by mouth at bedtime.     [provider]  traMADol (ULTRAM) 50 MG tablet Take 1 tablet (50 mg total) by mouth every 6 (six) hours as needed. 12/21/16   Long, Wonda Olds, MD  Vitamin D, Ergocalciferol, (DRISDOL) 50000 units CAPS capsule Take 50,000 Units by mouth every 30 (thirty) days.     [provider]  zoledronic acid (RECLAST) 5 MG/100ML SOLN injection Inject 5 mg into the vein See admin instructions. Pt gets a dose yearly. Due again 2018    [provider]    Family History Family History  Problem Relation Age of Onset  . Hypertension Mother   . Arthritis Mother   . Pneumonia Mother   . Hypertension Father   . Heart attack Father   . Colon cancer Neg Hx     Social History Social History   Tobacco Use  . Smoking status: Former Smoker    Packs/day: 0.50    Years: 40.00    Pack years: 20.00    Types: Cigarettes     Last attempt to quit: 12/20/2012    Years since quitting: 4.1  . Smokeless tobacco: Never Used  Substance Use Topics  . Alcohol use: No  . Drug use: No     Allergies   Patient has no known allergies.   Review of Systems Review of Systems  All other systems reviewed and are negative.    Physical Exam Updated Vital Signs BP (!) 158/103 (BP Location: Left Arm)   Pulse 91   Temp 97.8 F (36.6 C) (Oral)   Resp 18   Ht 5\' 9"  (1.753 m)   Wt 73 kg (161 lb)   SpO2 100%   BMI 23.78 kg/m   Physical Exam  Constitutional: He appears well-developed and well-nourished. No distress.  HENT:  Head: Normocephalic and atraumatic.  Mouth/Throat: Oropharynx is clear and moist. No oropharyngeal exudate.  Eyes: Conjunctivae and EOM are normal. Pupils are equal, round, and reactive to light. Right eye exhibits no discharge. Left eye exhibits no discharge. No scleral icterus.  Neck: Normal range of motion. Neck supple. No JVD present. No thyromegaly present.  Cardiovascular: Normal rate, regular rhythm, normal heart sounds and intact distal pulses. Exam reveals no gallop and no friction rub.  No murmur heard. Port-A-Cath in the left upper chest appears clean, no surrounding redness or drainage  Pulmonary/Chest: Effort normal and breath sounds normal. No respiratory distress. He has no wheezes. He has no rales.  Abdominal: Soft. Bowel sounds are normal. He exhibits no distension and no mass. There is no tenderness.  Musculoskeletal: Normal range of motion. He exhibits no edema or tenderness.  Lymphadenopathy:    He has no cervical adenopathy.  Neurological: He is alert. Coordination normal.  Skin: Skin is warm and dry. No rash noted. No erythema.  Psychiatric: He has a normal mood and affect. His behavior is normal.  Nursing note and vitals reviewed.    ED Treatments / Results  Labs (all labs ordered are listed, but only abnormal results are displayed) Labs Reviewed  CBC WITH  DIFFERENTIAL/PLATELET  BASIC METABOLIC PANEL  TROPONIN I  D-DIMER, QUANTITATIVE (NOT AT [facility])    EKG  EKG Interpretation  Date/Time:  Friday February 04 2017 14:42:06 EST Ventricular Rate:  88 PR Interval:    QRS Duration: 90 QT Interval:  360 QTC Calculation: 436 R Axis:   -20 Text Interpretation:  Sinus rhythm Left ventricular hypertrophy since last tracing no significant change Confirmed by Noemi Chapel [phone]) on 02/04/2017 3:01:21 PM       Radiology No results found.  Procedures Procedures (including critical care time)  Medications Ordered in ED Medications  aspirin chewable tablet 324 mg (not administered)     Initial Impression / Assessment and Plan / ED Course  I have reviewed the triage vital signs and the nursing notes.  Pertinent labs & imaging results that were available during my care of the patient were reviewed by me and considered in my medical decision making (see chart for details).  Clinical Course as of Feb 04 2026  Fri Feb 04, 2017  1643 Creatinine is at baseline - 1st troponin is neg, d dimer neg.  Needs second trop  [BM]    Clinical Course User Index [BM] Noemi Chapel, MD    The patient's EKG continues to show signs of left ventricular hypertrophy but this is not changed from prior.  He will get chest x-ray labs and a d-dimer, the only thing that I would consider doing differently would be an inferior vena cava filter should he have a recurrent pulmonary embolism however with no tachycardia, no hypoxia and a normal exam it seems unlikely that he would have recurrent pulmonary embolism.  Of note when he did have a pulmonary embolism in 2012 he had a very small clot burden right lower lobe pulmonary embolism.  His pain is currently left lower chest.  There is no reproducible tenderness to palpation, no other signs of pulmonary embolism.  The patient has had ongoing chest pain now for over 15 hours, I do not think this is an acute coronary  syndrome, the second troponin is negative, the d-dimer is negative, the patient is well-appearing, vital signs reviewed, patient informed of all of his results and the need for close follow-up.  Stable for discharge at this time  Vitals:   02/04/17 1441 02/04/17 1444 02/04/17 1630 02/04/17 1730  BP:  (!) 158/103 (!) 159/105 (!) 149/99  Pulse: 91  75 79  Resp: 20 18 16 16   Temp: 97.8 F (36.6 C)     TempSrc: Oral     SpO2:  100% 98% 98%  Weight:      Height:         Final Clinical Impressions(s) / ED Diagnoses   Final diagnoses:  Left sided chest pain    ED Discharge Orders  None       Noemi Chapel, MD 02/04/17 2028

## 2017-03-17 ENCOUNTER — Encounter (HOSPITAL_COMMUNITY): Payer: Self-pay

## 2017-03-17 ENCOUNTER — Encounter (HOSPITAL_COMMUNITY): Payer: Medicare Other | Attending: Adult Health

## 2017-03-17 VITALS — BP 156/99 | HR 78 | Resp 16

## 2017-03-17 DIAGNOSIS — Z85038 Personal history of other malignant neoplasm of large intestine: Secondary | ICD-10-CM | POA: Diagnosis not present

## 2017-03-17 DIAGNOSIS — C189 Malignant neoplasm of colon, unspecified: Secondary | ICD-10-CM

## 2017-03-17 DIAGNOSIS — D509 Iron deficiency anemia, unspecified: Secondary | ICD-10-CM | POA: Insufficient documentation

## 2017-03-17 DIAGNOSIS — Z452 Encounter for adjustment and management of vascular access device: Secondary | ICD-10-CM | POA: Insufficient documentation

## 2017-03-17 MED ORDER — HEPARIN SOD (PORK) LOCK FLUSH 100 UNIT/ML IV SOLN
500.0000 [IU] | Freq: Once | INTRAVENOUS | Status: AC
  Administered 2017-03-17: 500 [IU] via INTRAVENOUS

## 2017-03-17 MED ORDER — SODIUM CHLORIDE 0.9% FLUSH
10.0000 mL | Freq: Once | INTRAVENOUS | Status: AC
  Administered 2017-03-17: 10 mL via INTRAVENOUS

## 2017-03-17 NOTE — Patient Instructions (Signed)
[facility] at [facility]  Discharge Instructions:   _______________________________________________________________  Thank you for choosing [facility] at [facility] to provide your oncology and hematology care.  To afford each patient quality time with our providers, please arrive at least 15 minutes before your scheduled appointment.  You need to re-schedule your appointment if you arrive 10 or more minutes late.  We strive to give you quality time with our providers, and arriving late affects you and other patients whose appointments are after yours.  Also, if you no show three or more times for appointments you may be dismissed from the clinic.  Again, thank you for choosing [facility] at [facility]. Our hope is that these requests will allow you access to exceptional care and in a timely manner. _______________________________________________________________  If you have questions after your visit, please contact our office at [phone] between the hours of 8:30 a.m. and 5:00 p.m. Voicemails left after 4:30 p.m. will not be returned until the following business day. _______________________________________________________________  For prescription refill requests, have your pharmacy contact our office. _______________________________________________________________  Recommendations made by the consultant and any test results will be sent to your referring physician. _______________________________________________________________ 

## 2017-03-17 NOTE — Progress Notes (Signed)
Port flushed per protocol.  Site clean and dry with no bruising or swelling noted at site.  Band aid applied.  VSS with discharge.  No s/s of distress noted.

## 2017-04-04 ENCOUNTER — Telehealth: Payer: Self-pay | Admitting: Gastroenterology

## 2017-04-04 ENCOUNTER — Encounter: Payer: Self-pay | Admitting: Gastroenterology

## 2017-04-04 DIAGNOSIS — R1319 Other dysphagia: Secondary | ICD-10-CM

## 2017-04-04 DIAGNOSIS — R1310 Dysphagia, unspecified: Secondary | ICD-10-CM

## 2017-04-04 NOTE — Telephone Encounter (Signed)
I have sent patient an email with the appointment information below.

## 2017-04-04 NOTE — Telephone Encounter (Signed)
DG esophagus scheduled for 04/08/17 at 9:00am at [facility], arrival time 8:45am, npo 3 hrs prior to test. LMOVM for pt to make aware.

## 2017-04-04 NOTE — Telephone Encounter (Signed)
Patient sent a mychart message requesting barium pill esophagram that we discussed having at his last ov.  Please schedule BPE. Order placed.

## 2017-04-05 NOTE — Telephone Encounter (Signed)
Called spoke with pt and is aware of appointment details. He verbalized understanding and needed nothing further

## 2017-04-08 ENCOUNTER — Ambulatory Visit (HOSPITAL_COMMUNITY)
Admission: RE | Admit: 2017-04-08 | Discharge: 2017-04-08 | Disposition: A | Discharge status: Home / Self Care | Payer: Medicare Other | Source: Ambulatory Visit | Attending: Gastroenterology | Admitting: Gastroenterology

## 2017-04-08 DIAGNOSIS — R1310 Dysphagia, unspecified: Secondary | ICD-10-CM | POA: Insufficient documentation

## 2017-04-08 DIAGNOSIS — R1319 Other dysphagia: Secondary | ICD-10-CM

## 2017-04-08 DIAGNOSIS — K449 Diaphragmatic hernia without obstruction or gangrene: Secondary | ICD-10-CM | POA: Diagnosis not present

## 2017-04-18 NOTE — Progress Notes (Signed)
Please let patient know that there was no evidence of esophageal stricture, tablet passed easily, he has a small sliding hiatal hernia.   How is he doing since he increased lansoprazole to bid.

## 2017-04-30 NOTE — Progress Notes (Signed)
Great to hear!

## 2017-05-16 ENCOUNTER — Inpatient Hospital Stay (HOSPITAL_COMMUNITY): Payer: Medicare Other | Attending: Internal Medicine

## 2017-05-16 ENCOUNTER — Encounter (HOSPITAL_COMMUNITY): Payer: Self-pay

## 2017-05-16 ENCOUNTER — Other Ambulatory Visit: Payer: Self-pay

## 2017-05-16 DIAGNOSIS — D508 Other iron deficiency anemias: Secondary | ICD-10-CM

## 2017-05-16 DIAGNOSIS — D509 Iron deficiency anemia, unspecified: Secondary | ICD-10-CM | POA: Diagnosis present

## 2017-05-16 LAB — COMPREHENSIVE METABOLIC PANEL
ALBUMIN: 4 g/dL (ref 3.5–5.0)
ALT: 538 U/L — AB (ref 17–63)
AST: 1579 U/L — AB (ref 15–41)
Alkaline Phosphatase: 246 U/L — ABNORMAL HIGH (ref 38–126)
Anion gap: 12 (ref 5–15)
BILIRUBIN TOTAL: 0.9 mg/dL (ref 0.3–1.2)
BUN: 13 mg/dL (ref 6–20)
CO2: 21 mmol/L — ABNORMAL LOW (ref 22–32)
CREATININE: 2.17 mg/dL — AB (ref 0.61–1.24)
Calcium: 8.8 mg/dL — ABNORMAL LOW (ref 8.9–10.3)
Chloride: 105 mmol/L (ref 101–111)
GFR calc Af Amer: 35 mL/min — ABNORMAL LOW (ref >60)
GFR, EST NON AFRICAN AMERICAN: 30 mL/min — AB (ref >60)
GLUCOSE: 79 mg/dL (ref 65–99)
Potassium: 4 mmol/L (ref 3.5–5.1)
Sodium: 138 mmol/L (ref 135–145)
TOTAL PROTEIN: 6.8 g/dL (ref 6.5–8.1)

## 2017-05-16 LAB — IRON AND TIBC
IRON: 94 ug/dL (ref 45–182)
Saturation Ratios: 25 % (ref 17.9–39.5)
TIBC: 370 ug/dL (ref 250–450)
UIBC: 276 ug/dL

## 2017-05-16 LAB — CBC WITH DIFFERENTIAL/PLATELET
BASOS ABS: 0 10*3/uL (ref 0.0–0.1)
Basophils Relative: 0 %
Eosinophils Absolute: 0.1 10*3/uL (ref 0.0–0.7)
Eosinophils Relative: 1 %
HEMATOCRIT: 43.4 % (ref 39.0–52.0)
Hemoglobin: 13.8 g/dL (ref 13.0–17.0)
Lymphocytes Relative: 19 %
Lymphs Abs: 1.3 10*3/uL (ref 0.7–4.0)
MCH: 29.7 pg (ref 26.0–34.0)
MCHC: 31.8 g/dL (ref 30.0–36.0)
MCV: 93.3 fL (ref 78.0–100.0)
Monocytes Absolute: 0.7 10*3/uL (ref 0.1–1.0)
Monocytes Relative: 10 %
NEUTROS ABS: 4.8 10*3/uL (ref 1.7–7.7)
NEUTROS PCT: 70 %
Platelets: 153 10*3/uL (ref 150–400)
RBC: 4.65 MIL/uL (ref 4.22–5.81)
RDW: 13.4 % (ref 11.5–15.5)
WBC: 7 10*3/uL (ref 4.0–10.5)

## 2017-05-16 LAB — FERRITIN: Ferritin: 301 ng/mL (ref 24–336)

## 2017-05-16 MED ORDER — LIDOCAINE-PRILOCAINE 2.5-2.5 % EX CREA: 1.0000 "application " | TOPICAL_CREAM | CUTANEOUS | 1 refills | Status: DC | PRN

## 2017-05-16 MED ORDER — SODIUM CHLORIDE 0.9% FLUSH
10.0000 mL | INTRAVENOUS | Status: DC | PRN
  Administered 2017-05-16: 10 mL via INTRAVENOUS
  Filled 2017-05-16: qty 10

## 2017-05-16 MED ORDER — HEPARIN SOD (PORK) LOCK FLUSH 100 UNIT/ML IV SOLN
500.0000 [IU] | Freq: Once | INTRAVENOUS | Status: AC
  Administered 2017-05-16: 500 [IU] via INTRAVENOUS

## 2017-05-16 NOTE — Progress Notes (Signed)
[name] presented for Portacath access and flush. Portacath located chest wall accessed with  H 20 needle. Good blood return present. Portacath flushed with 32ml NS and 500U/6ml Heparin and needle removed intact. Procedure without incident. Patient tolerated procedure well.  Treatment given per orders. Patient tolerated it well without problems. Vitals stable and discharged home from clinic ambulatory. Follow up as scheduled.

## 2017-05-16 NOTE — Patient Instructions (Signed)
[place] at [facility] Discharge Instructions  RECOMMENDATIONS MADE BY THE CONSULTANT AND ANY TEST RESULTS WILL BE SENT TO YOUR REFERRING PHYSICIAN.  Port flush with labs done today. Follow up as scheduled.  Thank you for choosing [place] at [facility] to provide your oncology and hematology care.  To afford each patient quality time with our provider, please arrive at least 15 minutes before your scheduled appointment time.    If you have a lab appointment with the [facility] please come in thru the  Main Entrance and check in at the main information desk  You need to re-schedule your appointment should you arrive 10 or more minutes late.  We strive to give you quality time with our providers, and arriving late affects you and other patients whose appointments are after yours.  Also, if you no show three or more times for appointments you may be dismissed from the clinic at the providers discretion.     Again, thank you for choosing [facility].  Our hope is that these requests will decrease the amount of time that you wait before being seen by our physicians.       _____________________________________________________________  Should you have questions after your visit to [facility], please contact our office at [phone] between the hours of 8:30 a.m. and 4:30 p.m.  Voicemails left after 4:30 p.m. will not be returned until the following business day.  For prescription refill requests, have your pharmacy contact our office.       Resources For Cancer Patients and their Caregivers ? American Cancer Society: Can assist with transportation, wigs, general needs, runs Look Good Feel Better.        262-181-1922 ? Cancer Care: Provides financial assistance, online support groups, medication/co-pay assistance.  1-800-813-HOPE 343-300-4899) ? [place] Assists [place] cancer  patients and their families through emotional , educational and financial support.  6167613637 ? [place] DSS Where to apply for food stamps, Medicaid and utility assistance. 402-613-1516 ? [facility]: Transportation to medical appointments. 854-020-6051 ? Social Security Administration: May apply for disability if have a Stage IV cancer. 623-208-8260 901-584-5202 ? [employer], Disability and Transit Services: Assists with nutrition, care and transit needs. [place] Support Programs: @10RELATIVEDAYS @ > Cancer Support Group  2nd Tuesday of the month 1pm-2pm, Journey Room  > Creative Journey  3rd Tuesday of the month 1130am-1pm, Journey Room  > Look Good Feel Better  1st Wednesday of the month 10am-12 noon, Journey Room (Call [place] to register [phone])

## 2017-05-20 ENCOUNTER — Encounter (HOSPITAL_COMMUNITY): Payer: Self-pay | Admitting: Oncology

## 2017-05-23 ENCOUNTER — Encounter (HOSPITAL_COMMUNITY): Payer: Self-pay | Admitting: *Deleted

## 2017-05-23 ENCOUNTER — Telehealth (HOSPITAL_COMMUNITY): Payer: Self-pay | Admitting: Emergency Medicine

## 2017-05-23 NOTE — Telephone Encounter (Signed)
Spoke with Dr Walden Field about pts concerning increasing liver functions.  Wants pt to come in next week for repeat labs and to see Gretchen.  Notified pt that appt for labs is 3/13 at 9:10 am then he will see Mike Craze NP at 9:30 am.  Pt verbalized understanding.

## 2017-05-23 NOTE — Progress Notes (Signed)
Patient called clinic asking about his lab results.  I spoke with Dr. Walden Field and she wants patient to return next week for repeat labs and follow up with a physician due to his liver function tests being abnormal.  Patient is aware of appointments and verbalizes understanding.

## 2017-05-31 ENCOUNTER — Other Ambulatory Visit (HOSPITAL_COMMUNITY): Payer: Self-pay | Admitting: Adult Health

## 2017-05-31 DIAGNOSIS — C189 Malignant neoplasm of colon, unspecified: Secondary | ICD-10-CM

## 2017-05-31 DIAGNOSIS — R740 Nonspecific elevation of levels of transaminase and lactic acid dehydrogenase [LDH]: Secondary | ICD-10-CM

## 2017-05-31 DIAGNOSIS — R7401 Elevation of levels of liver transaminase levels: Secondary | ICD-10-CM

## 2017-05-31 NOTE — Progress Notes (Signed)
[name],  [id number]   CLINIC:  Medical Oncology/Hematology  PCP:  [name], MD [address] [place] [address] [phone]   REASON FOR VISIT:  Follow-up for Stage III adenocarcinoma of colon AND iron deficiency anemia   CURRENT THERAPY: Surveillance per NCCN Guidelines AND IV iron prn     HISTORY OF PRESENT ILLNESS:  (From Dr. Laverle Patter note on 02/04/17)      INTERVAL HISTORY:  [name] 67 y.o. male returns for follow-up for h/o colon cancer and iron deficiency anemia.    Labs recently collected on 05/16/17 revealed new marked elevation in his AST and ALT.  AST 1549, ALT 538.  Labs recently reviewed by Dr. Walden Field ([facility]), and she recommended patient come in today for repeat labs and office visit.  Here today unaccompanied.  Chart reviewed. He did have hospitalization at outside facility in [place], [place] from 03/24/17-03/28/17 for SBO (he has h/o recurrent SBOs).   Overall, he tells me he has been feeling "okay."  He tells me he has no appetite and no energy.  Reports occasional epigastric pain, which has been chronic. He has intermittent abdominal pain, nausea, and diarrhea which have been chronic and long-standing since his colon cancer surgery.  Diarrhea episodes generally occur about 3 times per day; diarrhea has not increased in frequency or consistency of stool.  Zofran helps with his nausea.  Denies any recent infections or fevers.  Denies any recent Tylenol or alcohol use.   Denies any recent frank bleeding episodes including blood in his stools, dark/tarry stools, hematuria, nosebleeds, hemoptysis, hematemesis, or gingival bleeding.  He has chronic issues with shortness of breath and cough, which he attributes to his CHF.  He has occasional peripheral neuropathy.  He does not sleep well, he feels like his mood has been "a little bit more down lately."  Denies any suicidal ideation or  feeling hopeless.  He is anxious about "having cancer somewhere in my body."  He is asking about a PET scan.  Otherwise, he is largely without other complaints today.      REVIEW OF SYSTEMS:  Review of Systems - Oncology Per HPI. Otherwise, 14-point ROS completed and negative except as stated above.   PAST MEDICAL/SURGICAL HISTORY:  Past Medical History:  Diagnosis Date  . 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 Anemia   . Anxiety   . Arthritis   . Barrett's esophagus   . Blood transfusion   . Bowel obstruction ([place]) 05/13/2012   Recurrent  . Bronchitis   . Chest pain at rest   . Chronic abdominal pain   . Erosive esophagitis   . ETOH abuse    quit 03/2010  . GERD (gastroesophageal reflux disease)   . Hx of Clostridium difficile infection 01/2012  . Hypertension   . Ileus ([place])   . Iron deficiency anemia 03/23/2016  . Obstruction of bowel ([place]) 03/03/14  . Osteoporosis   . Personal history of PE (pulmonary embolism) 10/01/2010  . Pneumonia   . Pulmonary embolism ([place]) 02/2010  . Recurrent upper respiratory infection (URI)   . S/P endoscopy September 28, 2010   erosive reflux esophagitis, Billroth I anatomy  . 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 Seizures ([place])   . Shortness of breath   . TIA (transient ischemic attack) 10/11  . Vitamin B12 deficiency    Past Surgical History:  Procedure Laterality Date  .  ABDOMINAL ADHESION SURGERY  03/04/15   @ [facility]  . ABDOMINAL EXPLORATION SURGERY    . abdominal sugery     for bowel obstruction x 8, all in 1980s, except for one in 07/2010  . APPENDECTOMY  1980s  . Billroth 1 hemigastrectomy  1980s   per patient for benign duodenal tumor  . CARDIAC CATHETERIZATION  07/17/2012  . CHOLECYSTECTOMY  1980s  . COLON SURGERY  May 2012   left hemicolectomy, colon cancer found at time of surgery for bowel obstruction  . COLONOSCOPY  03/18/2011   anastomosis at 35cm. Several adenomatous polyps removed. Sigmoid  diverticulosis. Next TCS 02/2013  . COLONOSCOPY N/A 07/24/2012   EUM:PNTIRW post segmental resection with normal-appearing colonic anastomosis aside from an adjacent polyp-removed as described above. Rectal polyp-removed as described above. CT findings appear to have been artifactual. tubular adenomas/prolapsed type polyp.  . COLONOSCOPY N/A 05/15/2015   Procedure: COLONOSCOPY;  Surgeon: [name], MD;  [facility];  Service: Endoscopy;  Laterality: N/A;  . COLONOSCOPY WITH PROPOFOL N/A 11/04/2016   Procedure: COLONOSCOPY WITH PROPOFOL;  Surgeon: [name], MD;  [facility];  Service: Endoscopy;  Laterality: N/A;  945   . ESOPHAGOGASTRODUODENOSCOPY  09/28/2010  . ESOPHAGOGASTRODUODENOSCOPY  12/01/2010   Cervical web status post dilation, erosive esophagitis, B1 hemigastrectomy, inflamed anastomosis  . ESOPHAGOGASTRODUODENOSCOPY  04/16/2011   excoriation at GEJ c/w trauma/M-W tear, friable gastric anastomosis, dilation efferent limb  . ESOPHAGOGASTRODUODENOSCOPY N/A 06/03/2014   Dr.[name]- cervcal esopphageal web s/p dilation. abnormal distal esophagus bx= barretts esophagus  . ESOPHAGOGASTRODUODENOSCOPY N/A 05/15/2015   Procedure: ESOPHAGOGASTRODUODENOSCOPY (EGD);  Surgeon: [name], MD;  [facility];  Service: Endoscopy;  Laterality: N/A;  230  . ESOPHAGOGASTRODUODENOSCOPY (EGD) WITH ESOPHAGEAL DILATION  02/25/2012   ERX:VQMGQQPY esophageal web-s/p dilation anddisruption as described above. Status post prior gastric with Billroth I configuration. Abnormal gastric mucosa at the anastomosis. Gastric biopsy showed mild chronic inflammation but no H. pylori   . ESOPHAGOGASTRODUODENOSCOPY (EGD) WITH PROPOFOL N/A 11/04/2016   Procedure: ESOPHAGOGASTRODUODENOSCOPY (EGD) WITH PROPOFOL;  Surgeon: [name], MD;  [facility];  Service: Endoscopy;  Laterality: N/A;  . HERNIA REPAIR     right inguinal  . MALONEY DILATION N/A 06/03/2014    Procedure: [name] DILATION;  Surgeon: [name], MD;  [facility];  Service: Endoscopy;  Laterality: N/A;  [name] DILATION N/A 05/15/2015   Procedure: [name] DILATION;  Surgeon: [name], MD;  [facility];  Service: Endoscopy;  Laterality: N/A;  . [name] DILATION N/A 11/04/2016   Procedure: [name] DILATION;  Surgeon: [name], MD;  [facility];  Service: Endoscopy;  Laterality: N/A;  . PORTACATH PLACEMENT    . SAVORY DILATION N/A 06/03/2014   Procedure: SAVORY DILATION;  Surgeon: [name], MD;  [facility];  Service: Endoscopy;  Laterality: N/A;     SOCIAL HISTORY:  Social History   Socioeconomic History  . Marital status: Married    Spouse name: Not on file  . Number of children: 3  . Years of education: Not on file  . Highest education level: Not on file  Social Needs  . Financial resource strain: Not on file  . Food insecurity - worry: Not on file  . Food insecurity - inability: Not on file  . Transportation needs - medical: Not on file  . Transportation needs - non-medical: Not on file  Occupational History  Employer: [employer]  Tobacco Use  . Smoking status: Former Smoker    Packs/day: 0.50    Years: 40.00    Pack years: 20.00    Types: Cigarettes    Last attempt to quit: 12/20/2012    Years since quitting: 4.4  . Smokeless tobacco: Never Used  Substance and Sexual Activity  . Alcohol use: No  . Drug use: No  . Sexual activity: No  Other Topics Concern  . Not on file  Social History Narrative  . Not on file    FAMILY HISTORY:  Family History  Problem Relation Age of Onset  . Hypertension Mother   . Arthritis Mother   . Pneumonia Mother   . Hypertension Father   . Heart attack Father   . Colon cancer Neg Hx     CURRENT MEDICATIONS:  Outpatient Encounter Medications as of 06/01/2017  Medication Sig Note  . albuterol (PROAIR HFA) 108 (90 BASE) MCG/ACT inhaler Inhale 2 puffs  into the lungs every 4 (four) hours as needed for wheezing or shortness of breath.  02/08/2014: .  . atorvastatin (LIPITOR) 20 MG tablet Take 20 mg by mouth at bedtime.    . busPIRone (BUSPAR) 7.5 MG tablet Take 7.5 mg by mouth 2 (two) times daily.   . carvedilol (COREG) 12.5 MG tablet Take 12.5 mg by mouth 2 (two) times daily with a meal.   . Cholecalciferol (VITAMIN D) 2000 units CAPS Take 2,000 Units by mouth daily.   . Copper Gluconate 2 MG CAPS Take 1 capsule by mouth daily.   . cyanocobalamin (,VITAMIN B-12,) 1000 MCG/ML injection Inject 1,000 mcg into the muscle every 30 (thirty) days. 10/16/2016: Patient generally takes around the first  . docusate sodium (COLACE) 100 MG capsule Take 200 mg by mouth daily as needed for mild constipation.    . enoxaparin (LOVENOX) 60 MG/0.6ML injection Inject 60 mg into the skin at bedtime.    . ferrous sulfate 325 (65 FE) MG tablet Take 1 tablet (325 mg total) by mouth 2 (two) times daily with a meal.   . lansoprazole (PREVACID) 30 MG capsule Take 1 capsule (30 mg total) 2 (two) times daily before a meal by mouth.   . levETIRAcetam (KEPPRA) 750 MG tablet Take 750 mg by mouth 2 (two) times daily.   [name] lidocaine-prilocaine (EMLA) cream Apply 1 application topically as needed (prior to accessing port).   . LORazepam (ATIVAN) 1 MG tablet Take 1 tablet (1 mg total) by mouth every 8 (eight) hours as needed for anxiety.   . magnesium oxide (MAGNESIUM-OXIDE) 400 (241.3 Mg) MG tablet Take 400 mg by mouth at bedtime.    . Multiple Vitamin (MULTIVITAMIN WITH MINERALS) TABS tablet Take 1 tablet by mouth daily.   . Nutritional Supplements (ENSURE PLUS HN) LIQD Take 1 Bottle daily by mouth.    . ondansetron (ZOFRAN ODT) 4 MG disintegrating tablet Take 1 tablet (4 mg total) by mouth every 8 (eight) hours as needed for nausea or vomiting.   . polyethylene glycol (MIRALAX / GLYCOLAX) packet Take 17 g by mouth daily. (Patient taking differently: Take 17 g by mouth daily as  needed for mild constipation. )   . Probiotic Product (PROBIOTIC PO) Take 1 tablet by mouth daily.   . prochlorperazine (COMPAZINE) 10 MG tablet Take 10 mg by mouth every 6 (six) hours as needed for nausea or vomiting.    . promethazine (PHENERGAN) 12.5 MG tablet Take 1 tablet (12.5 mg total) by mouth  every 6 (six) hours as needed for nausea or vomiting.   . sucralfate (CARAFATE) 1 GM/10ML suspension Take 10 mLs (1 g total) by mouth 4 (four) times daily -  with meals and at bedtime.   . tamsulosin (FLOMAX) 0.4 MG CAPS capsule Take 0.4 mg by mouth at bedtime.    . traMADol (ULTRAM) 50 MG tablet Take 1 tablet (50 mg total) by mouth every 6 (six) hours as needed.   . Vitamin D, Ergocalciferol, (DRISDOL) 50000 units CAPS capsule Take 50,000 Units by mouth every 30 (thirty) days.    . zoledronic acid (RECLAST) 5 MG/100ML SOLN injection Inject 5 mg into the vein See admin instructions. Pt gets a dose yearly. Due again 2018   . mirtazapine (REMERON) 30 MG tablet Take 1 tablet (30 mg total) by mouth at bedtime. Take 1/2 pill for first 7 days. Then take 1 whole pill daily thereafter.    Facility-Administered Encounter Medications as of 06/01/2017  Medication  . heparin lock flush 100 unit/mL  . sodium chloride flush (NS) 0.9 % injection 10 mL    ALLERGIES:  No Known Allergies   PHYSICAL EXAM:  ECOG Performance status: 1 - Symptomatic; remains independent   Vitals:   06/01/17 0948  BP: 126/77  Pulse: 84  Resp: 16  Temp: 97.8 F (36.6 C)  SpO2: 100%   Filed Weights   06/01/17 0948  Weight: 160 lb 1.6 oz (72.6 kg)    Physical Exam  Constitutional: He is oriented to person, place, and time and well-developed, well-nourished, and in no distress.  HENT:  Head: Normocephalic.  Mouth/Throat: Oropharynx is clear and moist. No oropharyngeal exudate.  Eyes: Conjunctivae are normal. Pupils are equal, round, and reactive to light. No scleral icterus.  Neck: Normal range of motion. Neck supple.    Cardiovascular: Normal rate and regular rhythm.  Pulmonary/Chest: Effort normal and breath sounds normal. No respiratory distress.  Abdominal: Soft. Bowel sounds are normal. There is tenderness (mild tenderness to epigastric region on palpation (this is also adjacent to diffuse scar tissue and intermittent pain could be secondary to adhesions). ).  Musculoskeletal: Normal range of motion. He exhibits no edema.  Lymphadenopathy:    He has no cervical adenopathy.       Right: No supraclavicular adenopathy present.       Left: No supraclavicular adenopathy present.  Neurological: He is alert and oriented to person, place, and time. No cranial nerve deficit. Gait normal.  Skin: Skin is warm and dry. No rash noted.  Psychiatric: Memory and judgment normal.  Anxious with mildly flat affect; mixed anxious and depressed mood   Nursing note and vitals reviewed.    LABORATORY DATA:  I have reviewed the labs as listed.  CBC    Component Value Date/Time   WBC 8.9 06/01/2017 0853   RBC 5.18 06/01/2017 0853   HGB 15.6 06/01/2017 0853   HCT 48.8 06/01/2017 0853   PLT 183 06/01/2017 0853   MCV 94.2 06/01/2017 0853   MCH 30.1 06/01/2017 0853   MCHC 32.0 06/01/2017 0853   RDW 13.9 06/01/2017 0853   LYMPHSABS 2.3 06/01/2017 0853   MONOABS 0.9 06/01/2017 0853   EOSABS 0.3 06/01/2017 0853   BASOSABS 0.0 06/01/2017 0853   CMP Latest Ref Rng & Units 06/01/2017 05/16/2017 02/04/2017  Glucose 65 - 99 mg/dL 107(H) 79 86  BUN 6 - 20 mg/dL 10 13 8   Creatinine 0.61 - 1.24 mg/dL 1.79(H) 2.17(H) 1.45(H)  Sodium 135 - 145 mmol/L 142  138 140  Potassium 3.5 - 5.1 mmol/L 4.2 4.0 3.8  Chloride 101 - 111 mmol/L 108 105 109  CO2 22 - 32 mmol/L 24 21(L) 24  Calcium 8.9 - 10.3 mg/dL 9.0 8.8(L) 8.2(L)  Total Protein 6.5 - 8.1 g/dL 7.1 6.8 -  Total Bilirubin 0.3 - 1.2 mg/dL 0.7 0.9 -  Alkaline Phos 38 - 126 U/L 117 246(H) -  AST 15 - 41 U/L 20 1,579(H) -  ALT 17 - 63 U/L 20 538(H) -    PENDING LABS:     DIAGNOSTIC IMAGING:  *The following radiologic images and reports have been reviewed independently and agree with below findings.  CT abd/pelvis: 05/16/17 CLINICAL DATA:  Initial evaluation for acute left upper quadrant abdominal pain.  EXAM: CT ABDOMEN AND PELVIS WITH CONTRAST  TECHNIQUE: Multidetector CT imaging of the abdomen and pelvis was performed using the standard protocol following bolus administration of intravenous contrast.  CONTRAST:  176mL ISOVUE-300 IOPAMIDOL (ISOVUE-300) INJECTION 61%  COMPARISON:  Prior radiograph from 09/15/2016 as well as CT from 06/11/2016.  FINDINGS: Lower chest: Mild scattered atelectatic changes present within the visualized lung bases. Visualized lungs are otherwise clear.  Hepatobiliary: Subcentimeter hypodensity within left hepatic lobe noted, too small the characterize, but stable from previous and of doubtful significance. Liver otherwise unremarkable. Gallbladder surgically absent. Intra and extrahepatic biliary dilatation like related to post cholecystectomy changes, similar to previous.  Pancreas: Pancreas demonstrates no acute inflammatory changes or mass lesion. Pancreatic duct mildly prominent measuring up to 3 mm, also similar from previous.  Spleen: Spleen within normal limits.  Adrenals/Urinary Tract: Adrenal glands are normal. Kidneys equal in size with symmetric enhancement. Right renal cysts are stable from previous. 4 mm nonobstructive left renal calculus also stable. Few punctate nonobstructive right renal calculi noted as well. No hydronephrosis. No focal enhancing renal mass. No hydroureter. Partially distended bladder within normal limits.  Stomach/Bowel: Sequelae of prior partial gastrectomy and gastrojejunostomy again noted. No evidence for obstruction. Postsurgical changes within the lower mid pelvis also unchanged. Appendix not visualize, consistent with history prior appendectomy. No acute  inflammatory changes seen about the bowels.  Vascular/Lymphatic: Moderate aortic atherosclerosis. No aneurysm. Normal intravascular enhancement seen throughout the intra-abdominal aorta and its branch vessels. Few mildly prominent subcentimeter mesenteric lymph nodes noted within left ab no, similar to previous. No pathologically enlarged intra-abdominal or pelvic lymph nodes identified.  Reproductive: Prostate mildly enlarged measuring 4.9 cm in transverse diameter.  Other: No free air or fluid.  Musculoskeletal: No acute osseous abnormality. No worrisome lytic or blastic osseous lesions.  IMPRESSION: 1. No CT evidence for acute intra-abdominal or pelvic process. 2. Nonobstructive bilateral nephrolithiasis. 3. Stable postsurgical changes from prior partial gastrectomy and gastrojejunostomy without complication. 4. Aortic atherosclerosis.   Electronically Signed   By: Jeannine Boga M.D.   On: 12/21/2016 17:49     PATHOLOGY:     ASSESSMENT & PLAN:   Stage III adenocarcinoma of colon:  -CEA not elevated at time of diagnosis and was 2.2 in 07/2010. Transient mild elevation in CEA to 5.5 in 11/2010. CEA pending for today.  -Last CT abd/pelvis on 12/21/16 showed no evidence of acute intra-abdominal or pelvic process; stable post-surgical changes from prior partial gastrectomy and gastrojejunostomy without complication. No mention of concerns for recurrent or metastatic disease.   -Shared with him that based on NCCN Guidelines, surveillance at this point for colon cancer survivors who are 5+ years out from diagnosis and treatment is based on physical exam and clinical  findings. There is no role for imaging (like PET scan) for surveillance at this point. We discussed the rationale for these recommendations, which are based on NCCN Guidelines.  -Marked elevation in LFTs initially very concerning, but has normalized on labs today.  I have also reached out to our colleagues  with GI for their recommendations as well and collected acute hepatitis panel today as well; these lab results are pending.  -Return to cancer center in 3 months for follow-up with Dr. Delton Coombes. He prefers shorter interval follow-ups at this time d/t his anxiety.     Iron deficiency anemia:  -Last IV iron 07/2016 Oncology Flowsheet 07/30/2016  ferumoxytol [facility]) IV 510 mg  -Hgb normal at 15.6 g/dL today. Iron studies in 04/2017 were normal.  Will continue to monitor with future follow-up visits.    Markedly elevated AST/ALT and alkaline phosphatase:  -Labs from 05/16/17 reviewed.  AST and ALT both very markedly elevated. AST 1579; ALT 538. Alkaline phos elevated at 246. Bilirubin normal.  No anemia or thrombocytopenia noted.  Liver function tests in 12/2016 were largely WNL. This is an acute and significant change. Previous AST/ALT in 12/2016 were both normal.   -Labs repeated today and AST/ALT have normalized, which is reassuring.  Unclear etiology of transient significantly elevated AST and ALT on 05/16/17.   -Will repeat labs in ~3 months to ensure stability. I will follow-up with GI regarding AST/ALT and acute hepatitis panel (once it has resulted) to see if they need to see the patient sooner.     Anxiety about health/Sleep disturbance/Decreased appetite:  -[name] appears very anxious about his health. He has long-standing sleep disturbance.  -One of his biggest concerns today is decreased appetite. "I just don't want anything to eat."  His weight is largely stable, although may be slightly skewed with fluctuations in fluid volume given his h/o CHF.  I suspect anxiety and depression may be playing a role in his decreased appetite. He is on Buspar BID for anxiety and has Ativan that he can take PRN.   -Discussed several options for medications to stimulate his appetite including Marinol, Megace, and Remeron. Discussed the mechanism of action, possible side effects, and how to take each  medication. He decided that he would like to try Remeron.  E-scribed Remeron to his pharmacy with the following instructions: Take 1/2 pill (15 mg) po QHS for first 7 days. Then take 1 whole tab (30 mg) at bedtime thereafter.  Encouraged him to call us and let us know if he has improvement in his symptoms or if he has any adverse side effects of the medication.  He understands that Remeron is sedating, and he should use caution when taking this medication with other sedating meds.  He is also on Keppra 2x/day; I checked drug compatibility with Keppra and Remeron, as I wanted to ensure that Remeron would not affect seizure threshold.  No documented evidence of this with these medications in combination.        Dispo:  -Return to cancer center in 3 months for follow-up with labs.  (CBC with diff, CMET, CEA, & iron studies).    All questions were answered to patient's stated satisfaction. Encouraged patient to call with any new concerns or questions before his next visit to the cancer center and we can certain see him sooner, if needed.       Orders placed this encounter:  Orders Placed This Encounter  Procedures  . CBC with Differential/Platelet  . Comprehensive  metabolic panel  . CEA  . Ferritin  . Iron and TIBC      Mike Craze, NP [place] [phone]

## 2017-06-01 ENCOUNTER — Inpatient Hospital Stay (HOSPITAL_COMMUNITY): Payer: Medicare Other

## 2017-06-01 ENCOUNTER — Encounter (HOSPITAL_COMMUNITY): Payer: Self-pay | Admitting: Adult Health

## 2017-06-01 ENCOUNTER — Inpatient Hospital Stay (HOSPITAL_COMMUNITY): Payer: Medicare Other | Attending: Internal Medicine | Admitting: Adult Health

## 2017-06-01 VITALS — BP 126/77 | HR 84 | Temp 97.8°F | Resp 16 | Wt 160.1 lb

## 2017-06-01 DIAGNOSIS — R740 Nonspecific elevation of levels of transaminase and lactic acid dehydrogenase [LDH]: Secondary | ICD-10-CM

## 2017-06-01 DIAGNOSIS — F418 Other specified anxiety disorders: Secondary | ICD-10-CM

## 2017-06-01 DIAGNOSIS — R630 Anorexia: Secondary | ICD-10-CM | POA: Diagnosis not present

## 2017-06-01 DIAGNOSIS — D509 Iron deficiency anemia, unspecified: Secondary | ICD-10-CM

## 2017-06-01 DIAGNOSIS — C189 Malignant neoplasm of colon, unspecified: Secondary | ICD-10-CM

## 2017-06-01 DIAGNOSIS — G479 Sleep disorder, unspecified: Secondary | ICD-10-CM | POA: Diagnosis not present

## 2017-06-01 DIAGNOSIS — Z85038 Personal history of other malignant neoplasm of large intestine: Secondary | ICD-10-CM | POA: Diagnosis present

## 2017-06-01 DIAGNOSIS — R7401 Elevation of levels of liver transaminase levels: Secondary | ICD-10-CM

## 2017-06-01 LAB — CBC WITH DIFFERENTIAL/PLATELET
BASOS PCT: 0 %
Basophils Absolute: 0 10*3/uL (ref 0.0–0.1)
EOS ABS: 0.3 10*3/uL (ref 0.0–0.7)
EOS PCT: 3 %
HCT: 48.8 % (ref 39.0–52.0)
HEMOGLOBIN: 15.6 g/dL (ref 13.0–17.0)
Lymphocytes Relative: 26 %
Lymphs Abs: 2.3 10*3/uL (ref 0.7–4.0)
MCH: 30.1 pg (ref 26.0–34.0)
MCHC: 32 g/dL (ref 30.0–36.0)
MCV: 94.2 fL (ref 78.0–100.0)
Monocytes Absolute: 0.9 10*3/uL (ref 0.1–1.0)
Monocytes Relative: 10 %
NEUTROS PCT: 61 %
Neutro Abs: 5.4 10*3/uL (ref 1.7–7.7)
PLATELETS: 183 10*3/uL (ref 150–400)
RBC: 5.18 MIL/uL (ref 4.22–5.81)
RDW: 13.9 % (ref 11.5–15.5)
WBC: 8.9 10*3/uL (ref 4.0–10.5)

## 2017-06-01 LAB — COMPREHENSIVE METABOLIC PANEL
ALBUMIN: 4 g/dL (ref 3.5–5.0)
ALK PHOS: 117 U/L (ref 38–126)
ALT: 20 U/L (ref 17–63)
AST: 20 U/L (ref 15–41)
Anion gap: 10 (ref 5–15)
BUN: 10 mg/dL (ref 6–20)
CALCIUM: 9 mg/dL (ref 8.9–10.3)
CHLORIDE: 108 mmol/L (ref 101–111)
CO2: 24 mmol/L (ref 22–32)
Creatinine, Ser: 1.79 mg/dL — ABNORMAL HIGH (ref 0.61–1.24)
GFR calc Af Amer: 44 mL/min — ABNORMAL LOW (ref >60)
GFR calc non Af Amer: 38 mL/min — ABNORMAL LOW (ref >60)
GLUCOSE: 107 mg/dL — AB (ref 65–99)
Potassium: 4.2 mmol/L (ref 3.5–5.1)
SODIUM: 142 mmol/L (ref 135–145)
Total Bilirubin: 0.7 mg/dL (ref 0.3–1.2)
Total Protein: 7.1 g/dL (ref 6.5–8.1)

## 2017-06-01 MED ORDER — MIRTAZAPINE 30 MG PO TABS: 30.0000 mg | ORAL_TABLET | Freq: Every day | ORAL | 1 refills | Status: DC

## 2017-06-01 NOTE — Patient Instructions (Addendum)
[place] at [facility] Discharge Instructions  You saw [name] Craze, NP, today Follow up in 3 months with Dr. Raliegh Ip with lab work. [name] is starting you on Remeron at bedtime to help you sleep. Take 1/2 pill at bedtime for 1 week, then increase to a whole pill at bedtime. See [name] at checkout for appointments.    Thank you for choosing [place] at [facility] to provide your oncology and hematology care.  To afford each patient quality time with our provider, please arrive at least 15 minutes before your scheduled appointment time.   If you have a lab appointment with the [facility] please come in thru the  Main Entrance and check in at the main information desk  You need to re-schedule your appointment should you arrive 10 or more minutes late.  We strive to give you quality time with our providers, and arriving late affects you and other patients whose appointments are after yours.  Also, if you no show three or more times for appointments you may be dismissed from the clinic at the providers discretion.     Again, thank you for choosing [facility].  Our hope is that these requests will decrease the amount of time that you wait before being seen by our physicians.       _____________________________________________________________  Should you have questions after your visit to [facility], please contact our office at [phone] between the hours of 8:30 a.m. and 4:30 p.m.  Voicemails left after 4:30 p.m. will not be returned until the following business day.  For prescription refill requests, have your pharmacy contact our office.       Resources For Cancer Patients and their Caregivers ? American Cancer Society: Can assist with transportation, wigs, general needs, runs Look Good Feel Better.        (779)010-1672 ? Cancer Care: Provides financial assistance, online support groups, medication/co-pay  assistance.  1-800-813-HOPE 308-083-9835) ? [facility] Assists [place] cancer patients and their families through emotional , educational and financial support.  249-768-4637 ? [place] DSS Where to apply for food stamps, Medicaid and utility assistance. 5641560843 ? [facility]: Transportation to medical appointments. 249-433-7165 ? Social Security Administration: May apply for disability if have a Stage IV cancer. 418-367-7213 (581)380-3336 ? [employer], Disability and Transit Services: Assists with nutrition, care and transit needs. [place] Support Programs:   > Cancer Support Group  2nd Tuesday of the month 1pm-2pm, Journey Room   > Creative Journey  3rd Tuesday of the month 1130am-1pm, Journey Room

## 2017-06-02 LAB — HEPATITIS PANEL, ACUTE
HCV Ab: <0.1 s/co ratio (ref 0.0–0.9)
HEP B C IGM: NEGATIVE
Hep A IgM: POSITIVE — AB
Hepatitis B Surface Ag: NEGATIVE

## 2017-06-02 LAB — CEA: CEA: 2.9 ng/mL (ref 0.0–4.7)

## 2017-06-05 ENCOUNTER — Encounter (HOSPITAL_COMMUNITY): Payer: Self-pay | Admitting: Adult Health

## 2017-07-07 ENCOUNTER — Encounter (HOSPITAL_COMMUNITY): Payer: Medicare Other

## 2017-07-27 ENCOUNTER — Encounter (HOSPITAL_COMMUNITY): Payer: Self-pay

## 2017-07-27 ENCOUNTER — Inpatient Hospital Stay (HOSPITAL_COMMUNITY): Payer: Medicare Other | Attending: Hematology

## 2017-07-27 VITALS — BP 135/69 | HR 69 | Temp 97.6°F | Resp 18

## 2017-07-27 DIAGNOSIS — R7401 Elevation of levels of liver transaminase levels: Secondary | ICD-10-CM

## 2017-07-27 DIAGNOSIS — Z85038 Personal history of other malignant neoplasm of large intestine: Secondary | ICD-10-CM | POA: Diagnosis present

## 2017-07-27 DIAGNOSIS — C189 Malignant neoplasm of colon, unspecified: Secondary | ICD-10-CM

## 2017-07-27 DIAGNOSIS — R740 Nonspecific elevation of levels of transaminase and lactic acid dehydrogenase [LDH]: Secondary | ICD-10-CM

## 2017-07-27 DIAGNOSIS — D509 Iron deficiency anemia, unspecified: Secondary | ICD-10-CM

## 2017-07-27 DIAGNOSIS — R630 Anorexia: Secondary | ICD-10-CM

## 2017-07-27 LAB — COMPREHENSIVE METABOLIC PANEL
ALBUMIN: 3.6 g/dL (ref 3.5–5.0)
ALK PHOS: 86 U/L (ref 38–126)
ALT: 18 U/L (ref 17–63)
ANION GAP: 10 (ref 5–15)
AST: 25 U/L (ref 15–41)
BILIRUBIN TOTAL: 0.8 mg/dL (ref 0.3–1.2)
BUN: 9 mg/dL (ref 6–20)
CALCIUM: 8 mg/dL — AB (ref 8.9–10.3)
CO2: 22 mmol/L (ref 22–32)
Chloride: 107 mmol/L (ref 101–111)
Creatinine, Ser: 1.45 mg/dL — ABNORMAL HIGH (ref 0.61–1.24)
GFR calc Af Amer: 56 mL/min — ABNORMAL LOW (ref >60)
GFR calc non Af Amer: 49 mL/min — ABNORMAL LOW (ref >60)
Glucose, Bld: 104 mg/dL — ABNORMAL HIGH (ref 65–99)
Potassium: 3.5 mmol/L (ref 3.5–5.1)
Sodium: 139 mmol/L (ref 135–145)
TOTAL PROTEIN: 6.5 g/dL (ref 6.5–8.1)

## 2017-07-27 LAB — CBC WITH DIFFERENTIAL/PLATELET
BASOS ABS: 0 10*3/uL (ref 0.0–0.1)
BASOS PCT: 0 %
EOS ABS: 0.2 10*3/uL (ref 0.0–0.7)
EOS PCT: 3 %
HCT: 41.2 % (ref 39.0–52.0)
Hemoglobin: 13.6 g/dL (ref 13.0–17.0)
Lymphocytes Relative: 25 %
Lymphs Abs: 2.3 10*3/uL (ref 0.7–4.0)
MCH: 30.4 pg (ref 26.0–34.0)
MCHC: 33 g/dL (ref 30.0–36.0)
MCV: 92 fL (ref 78.0–100.0)
MONO ABS: 0.9 10*3/uL (ref 0.1–1.0)
Monocytes Relative: 10 %
Neutro Abs: 5.5 10*3/uL (ref 1.7–7.7)
Neutrophils Relative %: 62 %
PLATELETS: 143 10*3/uL — AB (ref 150–400)
RBC: 4.48 MIL/uL (ref 4.22–5.81)
RDW: 14 % (ref 11.5–15.5)
WBC: 9 10*3/uL (ref 4.0–10.5)

## 2017-07-27 LAB — IRON AND TIBC
Iron: 82 ug/dL (ref 45–182)
SATURATION RATIOS: 26 % (ref 17.9–39.5)
TIBC: 314 ug/dL (ref 250–450)
UIBC: 232 ug/dL

## 2017-07-27 LAB — FERRITIN: Ferritin: 30 ng/mL (ref 24–336)

## 2017-07-27 MED ORDER — SODIUM CHLORIDE 0.9% FLUSH
10.0000 mL | Freq: Once | INTRAVENOUS | Status: AC
  Administered 2017-07-27: 10 mL via INTRAVENOUS

## 2017-07-27 MED ORDER — HEPARIN SOD (PORK) LOCK FLUSH 100 UNIT/ML IV SOLN
500.0000 [IU] | Freq: Once | INTRAVENOUS | Status: AC
  Administered 2017-07-27: 500 [IU] via INTRAVENOUS

## 2017-07-27 MED ORDER — HEPARIN SOD (PORK) LOCK FLUSH 100 UNIT/ML IV SOLN
INTRAVENOUS | Status: AC
  Filled 2017-07-27: qty 5

## 2017-07-27 NOTE — Progress Notes (Signed)
Patient in for port flush with lab draw.  Port site clean and dry with no bruising or swelling after lab draw.  No complaints of pain with flush.  Band aid applied.  VSS with discharge and left ambulatory with no s/s of distress noted.

## 2017-07-27 NOTE — Patient Instructions (Signed)
[place] at [facility]  Discharge Instructions:  Your port was flushed today with lab work  _______________________________________________________________  Thank you for choosing [place] at [facility] to provide your oncology and hematology care.  To afford each patient quality time with our providers, please arrive at least 15 minutes before your scheduled appointment.  You need to re-schedule your appointment if you arrive 10 or more minutes late.  We strive to give you quality time with our providers, and arriving late affects you and other patients whose appointments are after yours.  Also, if you no show three or more times for appointments you may be dismissed from the clinic.  Again, thank you for choosing [place] [facility] [place] hope is that these requests will allow you access to exceptional care and in a timely manner. _______________________________________________________________  If you have questions after your visit, please contact our office at [phone] between the hours of 8:30 a.m. and 5:00 p.m. Voicemails left after 4:30 p.m. will not be returned until the following business day. _______________________________________________________________  For prescription refill requests, have your pharmacy contact our office. _______________________________________________________________  Recommendations made by the consultant and any test results will be sent to your referring physician. _______________________________________________________________

## 2017-07-28 LAB — CEA: CEA: 2.8 ng/mL (ref 0.0–4.7)

## 2017-08-03 ENCOUNTER — Ambulatory Visit: Payer: Medicare Other | Admitting: Gastroenterology

## 2017-08-05 ENCOUNTER — Other Ambulatory Visit (HOSPITAL_COMMUNITY): Payer: Medicare Other

## 2017-08-05 ENCOUNTER — Ambulatory Visit (HOSPITAL_COMMUNITY): Payer: Medicare Other | Admitting: Hematology

## 2017-08-31 ENCOUNTER — Other Ambulatory Visit: Payer: Self-pay

## 2017-08-31 ENCOUNTER — Inpatient Hospital Stay (HOSPITAL_COMMUNITY): Payer: Medicare Other | Attending: Hematology | Admitting: Hematology

## 2017-08-31 ENCOUNTER — Encounter (HOSPITAL_COMMUNITY): Payer: Self-pay | Admitting: Hematology

## 2017-08-31 VITALS — BP 159/88 | HR 72 | Temp 97.9°F | Resp 16 | Wt 155.2 lb

## 2017-08-31 DIAGNOSIS — Z85038 Personal history of other malignant neoplasm of large intestine: Secondary | ICD-10-CM | POA: Diagnosis present

## 2017-08-31 DIAGNOSIS — R079 Chest pain, unspecified: Secondary | ICD-10-CM | POA: Diagnosis not present

## 2017-08-31 DIAGNOSIS — K5900 Constipation, unspecified: Secondary | ICD-10-CM | POA: Diagnosis not present

## 2017-08-31 DIAGNOSIS — Z87891 Personal history of nicotine dependence: Secondary | ICD-10-CM | POA: Insufficient documentation

## 2017-08-31 DIAGNOSIS — I10 Essential (primary) hypertension: Secondary | ICD-10-CM | POA: Diagnosis not present

## 2017-08-31 DIAGNOSIS — D509 Iron deficiency anemia, unspecified: Secondary | ICD-10-CM | POA: Insufficient documentation

## 2017-08-31 DIAGNOSIS — C189 Malignant neoplasm of colon, unspecified: Secondary | ICD-10-CM

## 2017-08-31 NOTE — Progress Notes (Signed)
[name], [place] [address]   CLINIC:  Medical Oncology/Hematology  PCP:  [name] Cipro, MD [place] [address] [phone]   REASON FOR VISIT:  Follow-up for colon [name] and iron deficiency.  CURRENT THERAPY: Limited Feraheme infusion.   [name] STAGING: [name] Staging Adenocarcinoma of colon with mucinous features Staging form: Colon and Rectum, AJCC 7th Edition - Clinical: Stage IIIB (T3, N1, M0) - Signed by [name] Cancer, PA on 09/22/2010    INTERVAL HISTORY:  [name] 67 y.o. male returns for follow-up of colon [name].  He denies any bleeding per rectum or melena.  He does not have lots of energy.  He is retired but he is able to do all his activities of daily living.  He complains of on and off sternal pain starting last night.  He has a history of congestive heart failure.  He never had heart attacks.  He follows up with Dr. Darral [name] and will.  He has occasional diarrhea since his surgery.  He denies any numbness in the extremities.    REVIEW OF SYSTEMS:  Review of Systems  Constitutional: Positive for fatigue.  Respiratory: Positive for shortness of breath.   Cardiovascular: Positive for chest pain.  Gastrointestinal: Positive for diarrhea.  Neurological: Positive for dizziness.  All other systems reviewed and are negative.    PAST MEDICAL/SURGICAL HISTORY:  Past Medical History:  Diagnosis Date  . 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 Anemia   . Anxiety   . Arthritis   . Barrett's esophagus   . Blood transfusion   . Bowel obstruction ([place]) 05/13/2012   Recurrent  . Bronchitis   . Chest pain at rest   . Chronic abdominal pain   . Erosive esophagitis   . ETOH abuse    quit 03/2010  . GERD (gastroesophageal reflux disease)   . Hx of Clostridium difficile infection 01/2012  . Hypertension   . Ileus ([place])   . Iron deficiency anemia 03/23/2016  . Obstruction  of bowel ([place]) 03/03/14  . Osteoporosis   . Personal history of PE (pulmonary embolism) 10/01/2010  . Pneumonia   . Pulmonary embolism ([place]) 02/2010  . Recurrent upper respiratory infection (URI)   . S/P endoscopy September 28, 2010   erosive reflux esophagitis, Billroth I anatomy  . 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 Seizures ([place])   . Shortness of breath   . TIA (transient ischemic attack) 10/11  . Vitamin B12 deficiency    Past Surgical History:  Procedure Laterality Date  . ABDOMINAL ADHESION SURGERY  03/04/15   @ [facility]  . ABDOMINAL EXPLORATION SURGERY    . abdominal sugery     for bowel obstruction x 8, all in 1980s, except for one in 07/2010  . APPENDECTOMY  1980s  . Billroth 1 hemigastrectomy  1980s   per patient for benign duodenal tumor  . CARDIAC CATHETERIZATION  07/17/2012  . CHOLECYSTECTOMY  1980s  . COLON SURGERY  May 2012   left hemicolectomy, colon [name] found at time of surgery for bowel obstruction  . COLONOSCOPY  03/18/2011   anastomosis at 35cm. Several adenomatous polyps removed. Sigmoid diverticulosis. Next TCS 02/2013  . COLONOSCOPY N/A 07/24/2012   MVH:QIONGE post segmental resection with normal-appearing colonic anastomosis aside from an adjacent polyp-removed as described above. Rectal polyp-removed as described above. CT findings appear to have been artifactual. tubular adenomas/prolapsed type polyp.  . COLONOSCOPY  N/A 05/15/2015   Procedure: COLONOSCOPY;  Surgeon: [name], MD;  [facility];  Service: Endoscopy;  Laterality: N/A;  . COLONOSCOPY WITH PROPOFOL N/A 11/04/2016   Procedure: COLONOSCOPY WITH PROPOFOL;  Surgeon: [name], MD;  [facility]: [facility];  Service: Endoscopy;  Laterality: N/A;  945   . ESOPHAGOGASTRODUODENOSCOPY  09/28/2010  . ESOPHAGOGASTRODUODENOSCOPY  12/01/2010   Cervical web status post dilation, erosive esophagitis, B1 hemigastrectomy, inflamed anastomosis  . ESOPHAGOGASTRODUODENOSCOPY  04/16/2011   excoriation at  GEJ c/w trauma/M-W tear, friable gastric anastomosis, dilation efferent limb  . ESOPHAGOGASTRODUODENOSCOPY N/A 06/03/2014   Dr.[name]- cervcal esopphageal web s/p dilation. abnormal distal esophagus bx= barretts esophagus  . ESOPHAGOGASTRODUODENOSCOPY N/A 05/15/2015   Procedure: ESOPHAGOGASTRODUODENOSCOPY (EGD);  Surgeon: [name], MD;  [facility];  Service: Endoscopy;  Laterality: N/A;  230  . ESOPHAGOGASTRODUODENOSCOPY (EGD) WITH ESOPHAGEAL DILATION  02/25/2012   WER:[id number] esophageal web-s/p dilation anddisruption as described above. Status post prior gastric with Billroth I configuration. Abnormal gastric mucosa at the anastomosis. Gastric biopsy showed mild chronic inflammation but no H. pylori   . ESOPHAGOGASTRODUODENOSCOPY (EGD) WITH PROPOFOL N/A 11/04/2016   Procedure: ESOPHAGOGASTRODUODENOSCOPY (EGD) WITH PROPOFOL;  Surgeon: [name], MD;  [facility];  Service: Endoscopy;  Laterality: N/A;  . HERNIA REPAIR     right inguinal  . MALONEY DILATION N/A 06/03/2014   Procedure: [name] DILATION;  Surgeon: [name], MD;  [facility];  Service: Endoscopy;  Laterality: N/A;  [name] DILATION N/A 05/15/2015   Procedure: [name] DILATION;  Surgeon: [name], MD;  [facility];  Service: Endoscopy;  Laterality: N/A;  . [name] DILATION N/A 11/04/2016   Procedure: [name] DILATION;  Surgeon: [name], MD;  [facility];  Service: Endoscopy;  Laterality: N/A;  . PORTACATH PLACEMENT    . SAVORY DILATION N/A 06/03/2014   Procedure: SAVORY DILATION;  Surgeon: [name], MD;  [facility];  Service: Endoscopy;  Laterality: N/A;     SOCIAL HISTORY:  Social History   Socioeconomic History  . Marital status: Married    Spouse name: Not on file  . Number of children: 3  . Years of education: Not on file  . Highest education level: Not on file  Occupational History    Employer: [employer]    Social Needs  . Financial resource strain: Not on file  . Food insecurity:    Worry: Not on file    Inability: Not on file  . Transportation needs:    Medical: Not on file    Non-medical: Not on file  Tobacco Use  . Smoking status: Former Smoker    Packs/day: 0.50    Years: 40.00    Pack years: 20.00    Types: Cigarettes    Last attempt to quit: 12/20/2012    Years since quitting: 4.6  . Smokeless tobacco: Never Used  Substance and Sexual Activity  . Alcohol use: No  . Drug use: No  . Sexual activity: Never  Lifestyle  . Physical activity:    Days per week: Not on file    Minutes per session: Not on file  . Stress: Not on file  Relationships  . Social connections:    Talks on phone: Not on file    Gets together: Not on file    Attends religious service: Not on file    Active member of club or organization:  Not on file    Attends meetings of clubs or organizations: Not on file    Relationship status: Not on file  . Intimate partner violence:    Fear of current or ex partner: Not on file    Emotionally abused: Not on file    Physically abused: Not on file    Forced sexual activity: Not on file  Other Topics Concern  . Not on file  Social History Narrative  . Not on file    FAMILY HISTORY:  Family History  Problem Relation Age of Onset  . Hypertension Mother   . Arthritis Mother   . Pneumonia Mother   . Hypertension Father   . Heart attack Father   . Colon [name] Neg Hx     CURRENT MEDICATIONS:  Outpatient Encounter Medications as of 08/31/2017  Medication Sig Note  . albuterol (PROAIR HFA) 108 (90 BASE) MCG/ACT inhaler Inhale 2 puffs into the lungs every 4 (four) hours as needed for wheezing or shortness of breath.  02/08/2014: .  . atorvastatin (LIPITOR) 20 MG tablet Take 20 mg by mouth at bedtime.    . busPIRone (BUSPAR) 7.5 MG tablet Take 7.5 mg by mouth 2 (two) times daily.   . carvedilol (COREG) 25 MG tablet Take 25 mg by mouth 2 (two) times daily. as  directed   . Cholecalciferol (VITAMIN D) 2000 units CAPS Take 2,000 Units by mouth daily.   . Copper Gluconate 2 MG CAPS Take 1 capsule by mouth daily.   . cyanocobalamin (,VITAMIN B-12,) 1000 MCG/ML injection Inject 1,000 mcg into the muscle every 30 (thirty) days. 10/16/2016: Patient generally takes around the first  . docusate sodium (COLACE) 100 MG capsule Take 200 mg by mouth daily as needed for mild constipation.    . enoxaparin (LOVENOX) 60 MG/0.6ML injection Inject 60 mg into the skin at bedtime.    . ferrous sulfate 325 (65 FE) MG tablet Take 1 tablet (325 mg total) by mouth 2 (two) times daily with a meal.   . lansoprazole (PREVACID) 30 MG capsule Take 1 capsule (30 mg total) 2 (two) times daily before a meal by mouth.   . levETIRAcetam (KEPPRA) 750 MG tablet Take 750 mg by mouth 2 (two) times daily.   [name] lidocaine-prilocaine (EMLA) cream Apply 1 application topically as needed (prior to accessing port).   . LORazepam (ATIVAN) 1 MG tablet Take 1 tablet (1 mg total) by mouth every 8 (eight) hours as needed for anxiety.   . magnesium oxide (MAGNESIUM-OXIDE) 400 (241.3 Mg) MG tablet Take 400 mg by mouth at bedtime.    . mirtazapine (REMERON) 30 MG tablet Take 1 tablet (30 mg total) by mouth at bedtime. Take 1/2 pill for first 7 days. Then take 1 whole pill daily thereafter.   . Multiple Vitamin (MULTIVITAMIN WITH MINERALS) TABS tablet Take 1 tablet by mouth daily.   . Nutritional Supplements (ENSURE PLUS HN) LIQD Take 1 Bottle daily by mouth.    . ondansetron (ZOFRAN ODT) 4 MG disintegrating tablet Take 1 tablet (4 mg total) by mouth every 8 (eight) hours as needed for nausea or vomiting.   . polyethylene glycol (MIRALAX / GLYCOLAX) packet Take 17 g by mouth daily. (Patient taking differently: Take 17 g by mouth daily as needed for mild constipation. )   . Probiotic Product (PROBIOTIC PO) Take 1 tablet by mouth daily.   . prochlorperazine (COMPAZINE) 10 MG tablet Take 10 mg by mouth every 6  (six) hours as needed  for nausea or vomiting.    . promethazine (PHENERGAN) 12.5 MG tablet Take 1 tablet (12.5 mg total) by mouth every 6 (six) hours as needed for nausea or vomiting.   . sucralfate (CARAFATE) 1 GM/10ML suspension Take 10 mLs (1 g total) by mouth 4 (four) times daily -  with meals and at bedtime.   . tamsulosin (FLOMAX) 0.4 MG CAPS capsule Take 0.4 mg by mouth at bedtime.    . traMADol (ULTRAM) 50 MG tablet Take 1 tablet (50 mg total) by mouth every 6 (six) hours as needed.   . Vitamin D, Ergocalciferol, (DRISDOL) 50000 units CAPS capsule Take 50,000 Units by mouth every 30 (thirty) days.    . zoledronic acid (RECLAST) 5 MG/100ML SOLN injection Inject 5 mg into the vein See admin instructions. Pt gets a dose yearly. Due again 2018   . [DISCONTINUED] carvedilol (COREG) 12.5 MG tablet Take 12.5 mg by mouth 2 (two) times daily with a meal.    Facility-Administered Encounter Medications as of 08/31/2017  Medication  . heparin lock flush 100 unit/mL  . sodium chloride flush (NS) 0.9 % injection 10 mL    ALLERGIES:  No Known Allergies   PHYSICAL EXAM:  ECOG Performance status: 1  Vitals:   08/31/17 1555  BP: (!) 159/88  Pulse: 72  Resp: 16  Temp: 97.9 F (36.6 C)  SpO2: 100%   Filed Weights   08/31/17 1555  Weight: 155 lb 3.2 oz (70.4 kg)    Physical Exam HEENT: No mucositis or thrush. Chest: Clear to auscultation bilaterally. CVS: S1-S2 regular rate and rhythm. Abdomen: No palpable hepato-splenomegaly or other masses. Extremities: No edema or cyanosis.  LABORATORY DATA:  I have reviewed the labs as listed.  CBC    Component Value Date/Time   WBC 9.0 07/27/2017 1316   RBC 4.48 07/27/2017 1316   HGB 13.6 07/27/2017 1316   HCT 41.2 07/27/2017 1316   PLT 143 (L) 07/27/2017 1316   MCV 92.0 07/27/2017 1316   MCH 30.4 07/27/2017 1316   MCHC 33.0 07/27/2017 1316   RDW 14.0 07/27/2017 1316   LYMPHSABS 2.3 07/27/2017 1316   MONOABS 0.9 07/27/2017 1316    EOSABS 0.2 07/27/2017 1316   BASOSABS 0.0 07/27/2017 1316   CMP Latest Ref Rng & Units 07/27/2017 06/01/2017 05/16/2017  Glucose 65 - 99 mg/dL 104(H) 107(H) 79  BUN 6 - 20 mg/dL 9 10 13   Creatinine 0.61 - 1.24 mg/dL 1.45(H) 1.79(H) 2.17(H)  Sodium 135 - 145 mmol/L 139 142 138  Potassium 3.5 - 5.1 mmol/L 3.5 4.2 4.0  Chloride 101 - 111 mmol/L 107 108 105  CO2 22 - 32 mmol/L 22 24 21(L)  Calcium 8.9 - 10.3 mg/dL 8.0(L) 9.0 8.8(L)  Total Protein 6.5 - 8.1 g/dL 6.5 7.1 6.8  Total Bilirubin 0.3 - 1.2 mg/dL 0.8 0.7 0.9  Alkaline Phos 38 - 126 U/L 86 117 246(H)  AST 15 - 41 U/L 25 20 1,579(H)  ALT 17 - 63 U/L 18 20 538(H)       DIAGNOSTIC IMAGING:  I have reviewed his CT scan from October 2018 and agree with the report..      ASSESSMENT & PLAN:   Adenocarcinoma of colon with mucinous features 1.  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 -Denies any bleeding per rectum or melena.  Last  colonoscopy/EGD was on 05/15/2015 which was normal. -Last CT scan in October 2018 was negative for metastatic disease. - He does not require any further scans unless symptomatic.  His CEA level today was within normal limits.  We will see him back in 6 months with repeat CEA and LFTs.  2.  Iron deficiency: - His ferritin today is 30.  It is down from 300 few months ago.  He has a history of chronic kidney disease with a creatinine around 1.45.  He has tried oral iron therapy in the past and could not tolerate it secondary to constipation.  He has received Feraheme in the past around February 2018.  He did not experience any side effects.  I will schedule him for 2 infusions of Feraheme 1 week apart.  We will plan to repeat iron panel, ferritin, G71 and folic acid at next visit.  3.  Patient complaining of nonspecific midsternal pain, started  last night, on and off.  I have asked him to call Dr. Darral [name] who is his cardiologist.      Orders placed this encounter:  Orders Placed This Encounter  Procedures  . CBC with Differential  . Comprehensive metabolic panel  . CEA  . Vitamin B12  . Folate  . Iron and TIBC  . Ferritin      Derek Jack, MD [place] [phone]

## 2017-08-31 NOTE — Assessment & Plan Note (Signed)
1.  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 -Denies any bleeding per rectum or melena.  Last colonoscopy/EGD was on 05/15/2015 which was normal. -Last CT scan in October 2018 was negative for metastatic disease. - He does not require any further scans unless symptomatic.  His CEA level today was within normal limits.  We will see him back in 6 months with repeat CEA and LFTs.  2.  Iron deficiency: - His ferritin today is 30.  It is down from 300 few months ago.  He has a history of chronic kidney disease with a creatinine around 1.45.  He has tried oral iron therapy in the past and could not tolerate it secondary to constipation.  He has received Feraheme in the past around February 2018.  He did not experience any side effects.  I will schedule him for 2 infusions of Feraheme 1 week apart.  We will plan to repeat iron panel, ferritin, R83 and folic acid at next visit.  3.  Patient complaining of nonspecific midsternal pain, started last night, on and off.  I have asked him to call Dr. Darral Dash who is his cardiologist.

## 2017-09-02 ENCOUNTER — Emergency Department (HOSPITAL_COMMUNITY): Payer: Medicare Other

## 2017-09-02 ENCOUNTER — Other Ambulatory Visit: Payer: Self-pay

## 2017-09-02 ENCOUNTER — Encounter (HOSPITAL_COMMUNITY): Payer: Self-pay

## 2017-09-02 ENCOUNTER — Emergency Department (HOSPITAL_COMMUNITY)
Admission: EM | Admit: 2017-09-02 | Discharge: 2017-09-02 | Disposition: A | Discharge status: Home / Self Care | Payer: Medicare Other | Attending: Emergency Medicine | Admitting: Emergency Medicine

## 2017-09-02 DIAGNOSIS — Z8673 Personal history of transient ischemic attack (TIA), and cerebral infarction without residual deficits: Secondary | ICD-10-CM | POA: Insufficient documentation

## 2017-09-02 DIAGNOSIS — R109 Unspecified abdominal pain: Secondary | ICD-10-CM | POA: Insufficient documentation

## 2017-09-02 DIAGNOSIS — Z85038 Personal history of other malignant neoplasm of large intestine: Secondary | ICD-10-CM | POA: Insufficient documentation

## 2017-09-02 DIAGNOSIS — Z87891 Personal history of nicotine dependence: Secondary | ICD-10-CM | POA: Diagnosis not present

## 2017-09-02 DIAGNOSIS — R197 Diarrhea, unspecified: Secondary | ICD-10-CM

## 2017-09-02 DIAGNOSIS — I5032 Chronic diastolic (congestive) heart failure: Secondary | ICD-10-CM | POA: Insufficient documentation

## 2017-09-02 DIAGNOSIS — I130 Hypertensive heart and chronic kidney disease with heart failure and stage 1 through stage 4 chronic kidney disease, or unspecified chronic kidney disease: Secondary | ICD-10-CM | POA: Insufficient documentation

## 2017-09-02 DIAGNOSIS — N183 Chronic kidney disease, stage 3 (moderate): Secondary | ICD-10-CM | POA: Diagnosis not present

## 2017-09-02 DIAGNOSIS — Z9049 Acquired absence of other specified parts of digestive tract: Secondary | ICD-10-CM | POA: Diagnosis not present

## 2017-09-02 DIAGNOSIS — Z79899 Other long term (current) drug therapy: Secondary | ICD-10-CM | POA: Diagnosis not present

## 2017-09-02 DIAGNOSIS — Z86711 Personal history of pulmonary embolism: Secondary | ICD-10-CM | POA: Insufficient documentation

## 2017-09-02 LAB — URINALYSIS, ROUTINE W REFLEX MICROSCOPIC
BILIRUBIN URINE: NEGATIVE
GLUCOSE, UA: NEGATIVE mg/dL
HGB URINE DIPSTICK: NEGATIVE
Ketones, ur: NEGATIVE mg/dL
Leukocytes, UA: NEGATIVE
Nitrite: NEGATIVE
PROTEIN: NEGATIVE mg/dL
Specific Gravity, Urine: 1.016 (ref 1.005–1.030)
pH: 5 (ref 5.0–8.0)

## 2017-09-02 LAB — CBC WITH DIFFERENTIAL/PLATELET
BASOS ABS: 0 10*3/uL (ref 0.0–0.1)
BASOS PCT: 0 %
Eosinophils Absolute: 0.2 10*3/uL (ref 0.0–0.7)
Eosinophils Relative: 2 %
HEMATOCRIT: 40.4 % (ref 39.0–52.0)
HEMOGLOBIN: 13.4 g/dL (ref 13.0–17.0)
Lymphocytes Relative: 33 %
Lymphs Abs: 3 10*3/uL (ref 0.7–4.0)
MCH: 30.7 pg (ref 26.0–34.0)
MCHC: 33.2 g/dL (ref 30.0–36.0)
MCV: 92.4 fL (ref 78.0–100.0)
MONO ABS: 0.8 10*3/uL (ref 0.1–1.0)
Monocytes Relative: 9 %
NEUTROS ABS: 5.1 10*3/uL (ref 1.7–7.7)
NEUTROS PCT: 56 %
Platelets: 136 10*3/uL — ABNORMAL LOW (ref 150–400)
RBC: 4.37 MIL/uL (ref 4.22–5.81)
RDW: 14.1 % (ref 11.5–15.5)
WBC: 9 10*3/uL (ref 4.0–10.5)

## 2017-09-02 LAB — COMPREHENSIVE METABOLIC PANEL
ALBUMIN: 3.1 g/dL — AB (ref 3.5–5.0)
ALK PHOS: 75 U/L (ref 38–126)
ALT: 16 U/L — AB (ref 17–63)
AST: 18 U/L (ref 15–41)
Anion gap: 5 (ref 5–15)
BILIRUBIN TOTAL: 0.6 mg/dL (ref 0.3–1.2)
BUN: 10 mg/dL (ref 6–20)
CALCIUM: 7.8 mg/dL — AB (ref 8.9–10.3)
CO2: 22 mmol/L (ref 22–32)
CREATININE: 1.15 mg/dL (ref 0.61–1.24)
Chloride: 112 mmol/L — ABNORMAL HIGH (ref 101–111)
GFR calc Af Amer: >60 mL/min (ref >60)
GLUCOSE: 98 mg/dL (ref 65–99)
POTASSIUM: 4.4 mmol/L (ref 3.5–5.1)
Sodium: 139 mmol/L (ref 135–145)
TOTAL PROTEIN: 5.8 g/dL — AB (ref 6.5–8.1)

## 2017-09-02 LAB — I-STAT TROPONIN, ED: Troponin i, poc: 0 ng/mL (ref 0.00–0.08)

## 2017-09-02 LAB — LIPASE, BLOOD: LIPASE: 39 U/L (ref 11–51)

## 2017-09-02 MED ORDER — HEPARIN SOD (PORK) LOCK FLUSH 100 UNIT/ML IV SOLN
500.0000 [IU] | Freq: Once | INTRAVENOUS | Status: AC
  Administered 2017-09-02: 500 [IU]
  Filled 2017-09-02: qty 5

## 2017-09-02 MED ORDER — HYDROMORPHONE HCL 1 MG/ML IJ SOLN
1.0000 mg | Freq: Once | INTRAMUSCULAR | Status: AC
  Administered 2017-09-02: 1 mg via INTRAVENOUS
  Filled 2017-09-02: qty 1

## 2017-09-02 MED ORDER — SODIUM CHLORIDE 0.9 % IV BOLUS
500.0000 mL | Freq: Once | INTRAVENOUS | Status: AC
  Administered 2017-09-02: 500 mL via INTRAVENOUS

## 2017-09-02 MED ORDER — ONDANSETRON HCL 4 MG/2ML IJ SOLN
4.0000 mg | Freq: Once | INTRAMUSCULAR | Status: AC
  Administered 2017-09-02: 4 mg via INTRAVENOUS
  Filled 2017-09-02: qty 2

## 2017-09-02 MED ORDER — HEPARIN SODIUM (PORCINE) 5000 UNIT/ML IJ SOLN
INTRAMUSCULAR | Status: AC
  Filled 2017-09-02: qty 1

## 2017-09-02 MED ORDER — DIPHENOXYLATE-ATROPINE 2.5-0.025 MG PO TABS: 2.0000 | ORAL_TABLET | Freq: Four times a day (QID) | ORAL | 0 refills | Status: DC | PRN

## 2017-09-02 MED ORDER — IOHEXOL 300 MG/ML  SOLN
100.0000 mL | Freq: Once | INTRAMUSCULAR | Status: AC | PRN
  Administered 2017-09-02: 100 mL via INTRAVENOUS

## 2017-09-02 NOTE — ED Triage Notes (Signed)
Pt reports onset of LUQ abd pain at 2130 last night with nausea, no vomiting, states is also having diarrhea

## 2017-09-02 NOTE — ED Notes (Signed)
Gave EKG to Dr.Pollina

## 2017-09-02 NOTE — ED Provider Notes (Signed)
[facility] EMERGENCY DEPARTMENT Provider Note   CSN: [id number] Arrival date & time: 09/02/17  0104     History   Chief Complaint Chief Complaint  Patient presents with  . Abdominal Pain    HPI [name] is a 67 y.o. male.  Patient presents to the ER for evaluation of abdominal pain.  Patient reports that he has a history of treatment for colon cancer including resection has had multiple bowel obstructions in the past.  He reports that he has been experiencing abdominal pain, nausea without vomiting and liquid diarrhea.  He feels that this is similar to what he has had with partial bowel obstructions in the past.     Past Medical History:  Diagnosis Date  . 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 Anemia   . Anxiety   . Arthritis   . Barrett's esophagus   . Blood transfusion   . Bowel obstruction ([place]) 05/13/2012   Recurrent  . Bronchitis   . Chest pain at rest   . Chronic abdominal pain   . Erosive esophagitis   . ETOH abuse    quit 03/2010  . GERD (gastroesophageal reflux disease)   . Hx of Clostridium difficile infection 01/2012  . Hypertension   . Ileus ([place])   . Iron deficiency anemia 03/23/2016  . Obstruction of bowel ([place]) 03/03/14  . Osteoporosis   . Personal history of PE (pulmonary embolism) 10/01/2010  . Pneumonia   . Pulmonary embolism ([place]) 02/2010  . Recurrent upper respiratory infection (URI)   . S/P endoscopy September 28, 2010   erosive reflux esophagitis, Billroth I anatomy  . 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 Seizures ([place])   . Shortness of breath   . TIA (transient ischemic attack) 10/11  . Vitamin B12 deficiency     Patient Active Problem List   Diagnosis Date Noted  . Seizure disorder ([place]) 09/16/2016  . CKD (chronic kidney disease) stage 3, GFR 30-59 ml/min (HCC) 06/14/2016  . Patient has nasogastric tube   . Ileus ([place]) 05/15/2016  . Chronic diastolic heart failure ([place]) 05/15/2016  . Iron deficiency  anemia 03/23/2016  . Acute renal failure ([place]) 03/19/2016  . Normocytic anemia 03/19/2016  . Hypotension 03/19/2016  . Anemia   . History of colon cancer, stage III   . Hx of colon cancer, stage III   . History of colonic polyps   . Barrett's esophagus without dysplasia   . Dysphagia   . Essential hypertension 11/30/2014  . Dysphagia, pharyngoesophageal phase   . Mucosal abnormality of esophagus   . SBO (small bowel obstruction) ([place]) 09/17/2013  . Rectal bleeding 07/11/2012  . Bowel obstruction ([place]) 07/11/2012  . Abnormal CT scan, colon 07/11/2012  . Esophageal dysphagia 02/22/2012  . H/O Clostridium difficile infection 02/22/2012  . Diarrhea 09/10/2011  . Cellulitis 09/10/2011  . Nausea 06/07/2011  . Abdominal distention 06/07/2011  . HTN (hypertension) 06/07/2011  . Partial small bowel obstruction ([place]) 05/31/2011  . GI bleed 05/27/2011  . Coagulopathy ([place]) 05/27/2011  . Gastroenteritis 05/27/2011  . Small bowel obstruction ([place]) 05/13/2011  . LUQ pain 05/13/2011  . Pulmonary embolism ([place]) 01/17/2011  . Chest pain at rest 01/17/2011  . Pneumonia 12/11/2010  . Hematemesis 12/05/2010  . Chronic abdominal pain 12/05/2010  . Pancytopenia due to antineoplastic chemotherapy ([place]) 12/05/2010  . Coffee ground emesis 11/29/2010  . Esophageal reflux disease 11/15/2010  . 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 Personal history of  PE (pulmonary embolism) 10/01/2010  . Bronchitis, acute 10/01/2010  . Erosive esophagitis 10/01/2010  . Fever chills 09/29/2010  . Bleeding gastrointestinal 09/26/2010  . Supratherapeutic INR 09/26/2010  . Abdominal pain 09/26/2010  . Nausea and vomiting 09/26/2010  . Anemia, chronic disease 09/26/2010  . Adenocarcinoma of colon with mucinous features 09/22/2010    Past Surgical History:  Procedure Laterality Date  . ABDOMINAL ADHESION SURGERY  03/04/15   @ [facility]  . ABDOMINAL EXPLORATION SURGERY    . abdominal sugery     for bowel obstruction  x 8, all in 1980s, except for one in 07/2010  . APPENDECTOMY  1980s  . Billroth 1 hemigastrectomy  1980s   per patient for benign duodenal tumor  . CARDIAC CATHETERIZATION  07/17/2012  . CHOLECYSTECTOMY  1980s  . COLON SURGERY  May 2012   left hemicolectomy, colon cancer found at time of surgery for bowel obstruction  . COLONOSCOPY  03/18/2011   anastomosis at 35cm. Several adenomatous polyps removed. Sigmoid diverticulosis. Next TCS 02/2013  . COLONOSCOPY N/A 07/24/2012   [id number] post segmental resection with normal-appearing colonic anastomosis aside from an adjacent polyp-removed as described above. Rectal polyp-removed as described above. CT findings appear to have been artifactual. tubular adenomas/prolapsed type polyp.  . COLONOSCOPY N/A 05/15/2015   Procedure: COLONOSCOPY;  Surgeon: [name], MD;  Location: [facility];  Service: Endoscopy;  Laterality: N/A;  . COLONOSCOPY WITH PROPOFOL N/A 11/04/2016   Procedure: COLONOSCOPY WITH PROPOFOL;  Surgeon: [name], MD;  Location: [facility];  Service: Endoscopy;  Laterality: N/A;  945   . ESOPHAGOGASTRODUODENOSCOPY  09/28/2010  . ESOPHAGOGASTRODUODENOSCOPY  12/01/2010   Cervical web status post dilation, erosive esophagitis, B1 hemigastrectomy, inflamed anastomosis  . ESOPHAGOGASTRODUODENOSCOPY  04/16/2011   excoriation at GEJ c/w trauma/M-W tear, friable gastric anastomosis, dilation efferent limb  . ESOPHAGOGASTRODUODENOSCOPY N/A 06/03/2014   Dr.[name]- cervcal esopphageal web s/p dilation. abnormal distal esophagus bx= barretts esophagus  . ESOPHAGOGASTRODUODENOSCOPY N/A 05/15/2015   Procedure: ESOPHAGOGASTRODUODENOSCOPY (EGD);  Surgeon: [name], MD;  [facility]: [facility];  Service: Endoscopy;  Laterality: N/A;  230  . ESOPHAGOGASTRODUODENOSCOPY (EGD) WITH ESOPHAGEAL DILATION  02/25/2012   [id number]:[id number] esophageal web-s/p dilation anddisruption as described above. Status post prior gastric with Billroth I  configuration. Abnormal gastric mucosa at the anastomosis. Gastric biopsy showed mild chronic inflammation but no H. pylori   . ESOPHAGOGASTRODUODENOSCOPY (EGD) WITH PROPOFOL N/A 11/04/2016   Procedure: ESOPHAGOGASTRODUODENOSCOPY (EGD) WITH PROPOFOL;  Surgeon: [name], MD;  [facility];  Service: Endoscopy;  Laterality: N/A;  . HERNIA REPAIR     right inguinal  . MALONEY DILATION N/A 06/03/2014   Procedure: [name] DILATION;  Surgeon: [name], MD;  [facility];  Service: Endoscopy;  Laterality: N/A;  [name] DILATION N/A 05/15/2015   Procedure: [name] DILATION;  Surgeon: [name], MD;  [facility];  Service: Endoscopy;  Laterality: N/A;  . [name] DILATION N/A 11/04/2016   Procedure: [name] DILATION;  Surgeon: [name], MD;  [facility];  Service: Endoscopy;  Laterality: N/A;  . PORTACATH PLACEMENT    . SAVORY DILATION N/A 06/03/2014   Procedure: SAVORY DILATION;  Surgeon: [name], MD;  [facility];  Service: Endoscopy;  Laterality: N/A;        Home Medications    Prior to Admission medications   Medication Sig Start Date End Date Taking? Authorizing Provider  albuterol [facility] HFA)  108 (90 BASE) MCG/ACT inhaler Inhale 2 puffs into the lungs every 4 (four) hours as needed for wheezing or shortness of breath.     [provider]  atorvastatin (LIPITOR) 20 MG tablet Take 20 mg by mouth at bedtime.     [provider]  busPIRone (BUSPAR) 7.5 MG tablet Take 7.5 mg by mouth 2 (two) times daily.    [provider]  carvedilol (COREG) 25 MG tablet Take 25 mg by mouth 2 (two) times daily. as directed 08/16/17   [provider]  Cholecalciferol (VITAMIN D) 2000 units CAPS Take 2,000 Units by mouth daily.    [provider]  Copper Gluconate 2 MG CAPS Take 1 capsule by mouth daily. 05/03/16   Baird Cancer, PA-C  cyanocobalamin (,VITAMIN B-12,) 1000 MCG/ML  injection Inject 1,000 mcg into the muscle every 30 (thirty) days.    [provider]  diphenoxylate-atropine (LOMOTIL) 2.5-0.025 MG tablet Take 2 tablets by mouth 4 (four) times daily as needed for diarrhea or loose stools. 09/02/17   Orpah Greek, MD  docusate sodium (COLACE) 100 MG capsule Take 200 mg by mouth daily as needed for mild constipation.     [provider]  enoxaparin (LOVENOX) 60 MG/0.6ML injection Inject 60 mg into the skin at bedtime.     [provider]  ferrous sulfate 325 (65 FE) MG tablet Take 1 tablet (325 mg total) by mouth 2 (two) times daily with a meal. 03/21/16   Thurnell Lose, MD  lansoprazole (PREVACID) 30 MG capsule Take 1 capsule (30 mg total) 2 (two) times daily before a meal by mouth. 01/31/17   Mahala Menghini, PA-C  levETIRAcetam (KEPPRA) 750 MG tablet Take 750 mg by mouth 2 (two) times daily.    [provider]  lidocaine-prilocaine (EMLA) cream Apply 1 application topically as needed (prior to accessing port). 05/16/17   Holley Bouche, NP  LORazepam (ATIVAN) 1 MG tablet Take 1 tablet (1 mg total) by mouth every 8 (eight) hours as needed for anxiety. 05/23/16   Elgergawy, Silver Huguenin, MD  magnesium oxide (MAGNESIUM-OXIDE) 400 (241.3 Mg) MG tablet Take 400 mg by mouth at bedtime.     [provider]  mirtazapine (REMERON) 30 MG tablet Take 1 tablet (30 mg total) by mouth at bedtime. Take 1/2 pill for first 7 days. Then take 1 whole pill daily thereafter. 06/01/17   Holley Bouche, NP  Multiple Vitamin (MULTIVITAMIN WITH MINERALS) TABS tablet Take 1 tablet by mouth daily.    [provider]  Nutritional Supplements (ENSURE PLUS HN) LIQD Take 1 Bottle daily by mouth.     [provider]  ondansetron (ZOFRAN ODT) 4 MG disintegrating tablet Take 1 tablet (4 mg total) by mouth every 8 (eight) hours as needed for nausea or vomiting. 12/21/16   Long, Wonda Olds, MD  polyethylene glycol (MIRALAX /  GLYCOLAX) packet Take 17 g by mouth daily. Patient taking differently: Take 17 g by mouth daily as needed for mild constipation.  09/19/16   Kathie Dike, MD  Probiotic Product (PROBIOTIC PO) Take 1 tablet by mouth daily.    [provider]  prochlorperazine (COMPAZINE) 10 MG tablet Take 10 mg by mouth every 6 (six) hours as needed for nausea or vomiting.     [provider]  promethazine (PHENERGAN) 12.5 MG tablet Take 1 tablet (12.5 mg total) by mouth every 6 (six) hours as needed for nausea or vomiting. 09/19/16  Kathie Dike, MD  sucralfate (CARAFATE) 1 GM/10ML suspension Take 10 mLs (1 g total) by mouth 4 (four) times daily -  with meals and at bedtime. 12/21/16   Long, Wonda Olds, MD  tamsulosin (FLOMAX) 0.4 MG CAPS capsule Take 0.4 mg by mouth at bedtime.     [provider]  traMADol (ULTRAM) 50 MG tablet Take 1 tablet (50 mg total) by mouth every 6 (six) hours as needed. 12/21/16   Long, Wonda Olds, MD  Vitamin D, Ergocalciferol, (DRISDOL) 50000 units CAPS capsule Take 50,000 Units by mouth every 30 (thirty) days.     [provider]  zoledronic acid (RECLAST) 5 MG/100ML SOLN injection Inject 5 mg into the vein See admin instructions. Pt gets a dose yearly. Due again 2018    [provider]    Family History Family History  Problem Relation Age of Onset  . Hypertension Mother   . Arthritis Mother   . Pneumonia Mother   . Hypertension Father   . Heart attack Father   . Colon cancer Neg Hx     Social History Social History   Tobacco Use  . Smoking status: Former Smoker    Packs/day: 0.50    Years: 40.00    Pack years: 20.00    Types: Cigarettes    Last attempt to quit: 12/20/2012    Years since quitting: 4.7  . Smokeless tobacco: Never Used  Substance Use Topics  . Alcohol use: No  . Drug use: No     Allergies   Patient has no known allergies.   Review of Systems Review of Systems  Gastrointestinal: Positive for abdominal  pain, diarrhea and nausea.  All other systems reviewed and are negative.    Physical Exam Updated Vital Signs BP (!) 149/105   Pulse 69   Temp 97.8 F (36.6 C) (Oral)   Resp 16   Ht 5\' 9"  (1.753 m)   Wt 69.4 kg (153 lb)   SpO2 97%   BMI 22.59 kg/m   Physical Exam  Constitutional: He is oriented to person, place, and time. He appears well-developed and well-nourished. No distress.  HENT:  Head: Normocephalic and atraumatic.  Right Ear: Hearing normal.  Left Ear: Hearing normal.  Nose: Nose normal.  Mouth/Throat: Oropharynx is clear and moist and mucous membranes are normal.  Eyes: Pupils are equal, round, and reactive to light. Conjunctivae and EOM are normal.  Neck: Normal range of motion. Neck supple.  Cardiovascular: Regular rhythm, S1 normal and S2 normal. Exam reveals no gallop and no friction rub.  No murmur heard. Pulmonary/Chest: Effort normal and breath sounds normal. No respiratory distress. He exhibits no tenderness.  Abdominal: Soft. Normal appearance and bowel sounds are normal. There is no hepatosplenomegaly. There is generalized tenderness. There is no rebound, no guarding, no tenderness at McBurney's point and negative Murphy's sign. No hernia.  Musculoskeletal: Normal range of motion.  Neurological: He is alert and oriented to person, place, and time. He has normal strength. No cranial nerve deficit or sensory deficit. Coordination normal. GCS eye subscore is 4. GCS verbal subscore is 5. GCS motor subscore is 6.  Skin: Skin is warm, dry and intact. No rash noted. No cyanosis.  Psychiatric: He has a normal mood and affect. His speech is normal and behavior is normal. Thought content normal.  Nursing note and vitals reviewed.    ED Treatments / Results  Labs (all labs ordered are listed, but only abnormal results are displayed) Labs Reviewed  CBC WITH DIFFERENTIAL/PLATELET - Abnormal; Notable for the following components:      Result Value   Platelets 136  (*)    All other components within normal limits  COMPREHENSIVE METABOLIC PANEL - Abnormal; Notable for the following components:   Chloride 112 (*)    Calcium 7.8 (*)    Total Protein 5.8 (*)    Albumin 3.1 (*)    ALT 16 (*)    All other components within normal limits  URINALYSIS, ROUTINE W REFLEX MICROSCOPIC  LIPASE, BLOOD  I-STAT TROPONIN, ED    EKG EKG Interpretation  Date/Time:  Friday September 02 2017 02:41:31 EDT Ventricular Rate:  68 PR Interval:    QRS Duration: 88 QT Interval:  376 QTC Calculation: 400 R Axis:   -24 Text Interpretation:  Sinus rhythm Atrial premature complex Borderline left axis deviation Abnormal R-wave progression, early transition Confirmed by Orpah Greek [phone]) on 09/02/2017 5:24:15 AM   Radiology Ct Abdomen Pelvis W Contrast  Result Date: 09/02/2017 CLINICAL DATA:  Acute left upper quadrant abdominal pain. Nausea. Diarrhea. EXAM: CT ABDOMEN AND PELVIS WITH CONTRAST TECHNIQUE: Multidetector CT imaging of the abdomen and pelvis was performed using the standard protocol following bolus administration of intravenous contrast. CONTRAST:  157mL OMNIPAQUE IOHEXOL 300 MG/ML  SOLN COMPARISON:  Radiograph earlier this day.  Most recent CT 12/21/2016 FINDINGS: Lower chest: Scattered lung base atelectasis.  No pleural fluid. Hepatobiliary: Small subcentimeter hypodensity in the left lobe of the liver too small to characterize but likely cyst, unchanged from prior. Postcholecystectomy. Similar appearance of dilated tortuous common bile duct with mild intrahepatic biliary ductal dilatation. No visualized choledocholithiasis. Pancreas: Pancreatic ductal prominence measuring 3-4 mm, similar to prior exam. No peripancreatic inflammation. No visualized pancreatic mass. Spleen: Normal in size without focal abnormality. Adrenals/Urinary Tract: Normal adrenal glands. No hydronephrosis or perinephric edema. Homogeneous renal enhancement with symmetric excretion on  delayed phase imaging. Extrarenal pelvis configuration on the left. Right renal cysts largest anteriorly measuring 5.1 cm. Bilateral nephrolithiasis on prior exam is not well seen currently. Urinary bladder is physiologically distended without wall thickening. Stomach/Bowel: Bowel evaluation is limited in the absence of enteric contrast. Partial gastrectomy without abnormal gastric distension. Prior left hemicolectomy with anastomotic sutures in the sigmoid colon. Colon just proximal to the sutures is dilated with air and small volume of stool. Proximal transverse and hepatic flexure of the colon are air-filled and mildly dilated. Liquid stool in the ascending colon. No colonic wall thickening or inflammatory change. No small bowel dilatation to suggest obstruction. Anastomotic sutures in the left abdomen. Vascular/Lymphatic: Aortic atherosclerosis without aneurysm. Small central mesenteric nodes not enlarged by size criteria. No enlarged lymph nodes in the abdomen or pelvis. Reproductive: Prominent prostate gland spans 5 cm transverse. Other: No free air, free fluid, or intra-abdominal fluid collection. Minimal fat in the inguinal canals. Postsurgical change of the anterior abdominal wall. Scattered soft tissue densities air in the anterior wall subcutaneous tissues in a pattern consistent with injection sites. Musculoskeletal: Lumbarization of S1. There are no acute or suspicious osseous abnormalities. IMPRESSION: 1. Liquid stool in the proximal colon with mild gaseous colonic distention suggesting generalized diarrheal process. No obstruction or bowel wall thickening. 2. Left colectomy with chronic gaseous distention of colon just proximal to the colonic sutures. 3.  Aortic Atherosclerosis (ICD10-I70.0). Electronically Signed   By: Jeb Levering M.D.   On: 09/02/2017 06:45   Dg Abd Acute W/chest  Result Date: 09/02/2017 CLINICAL DATA:  Abdominal pain.  Diarrhea. EXAM:  DG ABDOMEN ACUTE W/ 1V CHEST  COMPARISON:  Most recent CT 12/21/2016. Abdominal radiograph 09/16/2016 FINDINGS: Left chest port with tip in the proximal SVC. Unchanged heart size and mediastinal contours. Linear bibasilar opacities may be atelectasis or scarring. No confluent airspace disease. No large pleural effusion. Gaseous colonic distention with prominent air-filled small bowel in the left abdomen. Few colonic air-fluid levels on the right. Multiple enteric sutures and surgical clips. No free intra-abdominal air. No significant formed stool. No abnormal rectal distention. Osseous structures are unchanged. IMPRESSION: 1. Mild gaseous distention of colon and to a lesser extent small bowel, similar to June 2018 exam. Findings suggest bowel dysmotility and generalized slow transit. No free air. 2. Bibasilar atelectasis or scarring. Electronically Signed   By: Jeb Levering M.D.   On: 09/02/2017 03:55    Procedures Procedures (including critical care time)  Medications Ordered in ED Medications  sodium chloride 0.9 % bolus 500 mL (0 mLs Intravenous Stopped 09/02/17 0410)  HYDROmorphone (DILAUDID) injection 1 mg (1 mg Intravenous Given 09/02/17 0259)  ondansetron (ZOFRAN) injection 4 mg (4 mg Intravenous Given 09/02/17 0259)  iohexol (OMNIPAQUE) 300 MG/ML solution 100 mL (100 mLs Intravenous Contrast Given 09/02/17 0557)  HYDROmorphone (DILAUDID) injection 1 mg (1 mg Intravenous Given 09/02/17 7673)     Initial Impression / Assessment and Plan / ED Course  I have reviewed the triage vital signs and the nursing notes.  Pertinent labs & imaging results that were available during my care of the patient were reviewed by me and considered in my medical decision making (see chart for details).     Presents to the emergency department for evaluation of abdominal pain.  Patient has a significant history of colon cancer with resection and has had multiple bowel obstructions, both partial and complete obstruction requiring surgery.   Abdominal exam did not reveal significant distention.  He did have tenderness but it was mild and diffuse.  No signs of peritonitis.  Lab work including blood work and urinalysis unremarkable.  X-ray did not raise suspicion for obstruction.  Based on his history, however, it was felt that CAT scan would be required.  This was performed and it does show some evidence of diarrheal process but no evidence of obstruction.  Patient will not require hospitalization, will be treated symptomatically he can follow-up with PCP.  Final Clinical Impressions(s) / ED Diagnoses   Final diagnoses:  Abdominal pain, unspecified abdominal location  Diarrhea, unspecified type    ED Discharge Orders        Ordered    diphenoxylate-atropine (LOMOTIL) 2.5-0.025 MG tablet  4 times daily PRN     09/02/17 0717       Orpah Greek, MD 09/02/17 [id number]

## 2017-09-02 NOTE — ED Notes (Signed)
Checked with pt for urine sample pt can't go right now, but aware we need sample,will check back in 30 minutes.

## 2017-09-05 ENCOUNTER — Inpatient Hospital Stay (HOSPITAL_COMMUNITY): Payer: Medicare Other

## 2017-09-05 ENCOUNTER — Other Ambulatory Visit: Payer: Self-pay | Admitting: Gastroenterology

## 2017-09-05 ENCOUNTER — Encounter (HOSPITAL_COMMUNITY): Payer: Self-pay

## 2017-09-05 VITALS — BP 120/64 | HR 54 | Temp 98.3°F | Resp 18

## 2017-09-05 DIAGNOSIS — D508 Other iron deficiency anemias: Secondary | ICD-10-CM

## 2017-09-05 DIAGNOSIS — Z85038 Personal history of other malignant neoplasm of large intestine: Secondary | ICD-10-CM | POA: Diagnosis not present

## 2017-09-05 MED ORDER — SODIUM CHLORIDE 0.9% FLUSH: 3.0000 mL | Freq: Once | INTRAVENOUS | Status: DC | PRN

## 2017-09-05 MED ORDER — SODIUM CHLORIDE 0.9 % IV SOLN
Freq: Once | INTRAVENOUS | Status: AC
Start: 2017-09-05 — End: 2017-09-05
  Administered 2017-09-05: 14:00:00 via INTRAVENOUS

## 2017-09-05 MED ORDER — ALTEPLASE 2 MG IJ SOLR: 2.0000 mg | Freq: Once | INTRAMUSCULAR | Status: DC | PRN

## 2017-09-05 MED ORDER — SODIUM CHLORIDE 0.9% FLUSH
10.0000 mL | INTRAVENOUS | Status: DC | PRN
  Administered 2017-09-05: 10 mL
  Filled 2017-09-05: qty 10

## 2017-09-05 MED ORDER — SODIUM CHLORIDE 0.9 % IV SOLN
510.0000 mg | Freq: Once | INTRAVENOUS | Status: AC
  Administered 2017-09-05: 510 mg via INTRAVENOUS
  Filled 2017-09-05: qty 17

## 2017-09-05 MED ORDER — HEPARIN SOD (PORK) LOCK FLUSH 100 UNIT/ML IV SOLN
250.0000 [IU] | Freq: Once | INTRAVENOUS | Status: DC | PRN
  Filled 2017-09-05: qty 5

## 2017-09-05 MED ORDER — HEPARIN SOD (PORK) LOCK FLUSH 100 UNIT/ML IV SOLN
500.0000 [IU] | Freq: Once | INTRAVENOUS | Status: AC | PRN
  Administered 2017-09-05: 500 [IU]

## 2017-09-05 NOTE — Progress Notes (Signed)
[name] tolerated Feraheme infusion well without complaints or incident. VSS upon discharge. Pt discharged self ambulatory in satisfactory condition

## 2017-09-05 NOTE — Patient Instructions (Signed)
[facility] Discharge Instructions  Received Feraheme infusion today. Follow-up as scheduled. Call clinic for any questions or concerns   Thank you for choosing [facility] at [facility] to provide your oncology and hematology care.  To afford each patient quality time with our provider, please arrive at least 15 minutes before your scheduled appointment time.   If you have a lab appointment with the Cancer Center please come in thru the  Main Entrance and check in at the main information desk  You need to re-schedule your appointment should you arrive 10 or more minutes late.  We strive to give you quality time with our providers, and arriving late affects you and other patients whose appointments are after yours.  Also, if you no show three or more times for appointments you may be dismissed from the clinic at the providers discretion.     Again, thank you for choosing [facility].  Our hope is that these requests will decrease the amount of time that you wait before being seen by our physicians.       _____________________________________________________________  Should you have questions after your visit to  Cancer Center, please contact our office at [phone] between the hours of 8:30 a.m. and 4:30 p.m.  Voicemails left after 4:30 p.m. will not be returned until the following business day.  For prescription refill requests, have your pharmacy contact our office.       Resources For Cancer Patients and their Caregivers ? American Cancer Society: Can assist with transportation, wigs, general needs, runs Look Good Feel Better.        1-888-227-6333 ? Cancer Care: Provides financial assistance, online support groups, medication/co-pay assistance.  1-800-813-HOPE (4673) ? [facility] Assists [place] cancer patients and their families through emotional , educational and  financial support.  336-427-4357 ? [place] DSS Where to apply for food stamps, Medicaid and utility assistance. 336-342-1394 ? [facility]: Transportation to medical appointments. 336-347-2287 ? Social Security Administration: May apply for disability if have a Stage IV cancer. 336-342-7796 1-800-772-1213 ? [place] Aging, Disability and Transit Services: Assists with nutrition, care and transit needs. 336-349-2343  Cancer Center Support Programs:   > Cancer Support Group  2nd Tuesday of the month 1pm-2pm, Journey Room   > Creative Journey  3rd Tuesday of the month 1130am-1pm, Journey Room    

## 2017-09-12 ENCOUNTER — Encounter (HOSPITAL_COMMUNITY): Payer: Self-pay

## 2017-09-12 ENCOUNTER — Inpatient Hospital Stay (HOSPITAL_COMMUNITY): Payer: Medicare Other

## 2017-09-12 VITALS — BP 119/73 | HR 69 | Temp 98.3°F | Resp 18

## 2017-09-12 DIAGNOSIS — Z85038 Personal history of other malignant neoplasm of large intestine: Secondary | ICD-10-CM | POA: Diagnosis not present

## 2017-09-12 DIAGNOSIS — D508 Other iron deficiency anemias: Secondary | ICD-10-CM

## 2017-09-12 MED ORDER — HEPARIN SOD (PORK) LOCK FLUSH 100 UNIT/ML IV SOLN: 250.0000 [IU] | Freq: Once | INTRAVENOUS | Status: DC | PRN

## 2017-09-12 MED ORDER — SODIUM CHLORIDE 0.9% FLUSH
10.0000 mL | INTRAVENOUS | Status: DC | PRN
  Administered 2017-09-12: 10 mL
  Filled 2017-09-12: qty 10

## 2017-09-12 MED ORDER — SODIUM CHLORIDE 0.9 % IV SOLN
510.0000 mg | Freq: Once | INTRAVENOUS | Status: AC
  Administered 2017-09-12: 510 mg via INTRAVENOUS
  Filled 2017-09-12: qty 17

## 2017-09-12 MED ORDER — SODIUM CHLORIDE 0.9 % IV SOLN
Freq: Once | INTRAVENOUS | Status: AC
  Administered 2017-09-12: 13:00:00 via INTRAVENOUS

## 2017-09-12 MED ORDER — HEPARIN SOD (PORK) LOCK FLUSH 100 UNIT/ML IV SOLN
500.0000 [IU] | Freq: Once | INTRAVENOUS | Status: AC
  Administered 2017-09-12: 500 [IU] via INTRAVENOUS

## 2017-09-12 NOTE — Patient Instructions (Signed)
[place] at [facility] Discharge Instructions  Received Feraheme ifusion today. Follow-up as scheduled. Call clinic for any questions or concerns   Thank you for choosing [place] at [facility] to provide your oncology and hematology care.  To afford each patient quality time with our provider, please arrive at least 15 minutes before your scheduled appointment time.   If you have a lab appointment with the [place] please come in thru the  Main Entrance and check in at the main information desk  You need to re-schedule your appointment should you arrive 10 or more minutes late.  We strive to give you quality time with our providers, and arriving late affects you and other patients whose appointments are after yours.  Also, if you no show three or more times for appointments you may be dismissed from the clinic at the providers discretion.     Again, thank you for choosing [facility].  Our hope is that these requests will decrease the amount of time that you wait before being seen by our physicians.       _____________________________________________________________  Should you have questions after your visit to [facility], please contact our office at [phone] between the hours of 8:30 a.m. and 4:30 p.m.  Voicemails left after 4:30 p.m. will not be returned until the following business day.  For prescription refill requests, have your pharmacy contact our office.       Resources For Cancer Patients and their Caregivers ? American Cancer Society: Can assist with transportation, wigs, general needs, runs Look Good Feel Better.        760-526-1297 ? Cancer Care: Provides financial assistance, online support groups, medication/co-pay assistance.  1-800-813-HOPE (306)733-4917) ? [place] Assists [place] cancer patients and their families through emotional , educational and  financial support.  720-759-9814 ? [place] DSS Where to apply for food stamps, Medicaid and utility assistance. (574) 393-7715 ? [employer]: Transportation to medical appointments. (203)007-5209 ? Social Security Administration: May apply for disability if have a Stage IV cancer. 671-824-5647 386 099 4860 ? [employer], Disability and Transit Services: Assists with nutrition, care and transit needs. [place] Support Programs:   > Cancer Support Group  2nd Tuesday of the month 1pm-2pm, Journey Room   > Creative Journey  3rd Tuesday of the month 1130am-1pm, Journey Room

## 2017-09-12 NOTE — Progress Notes (Signed)
[name] tolerated Feraheme infusion well without complaints or incident. VSS upon discharge. Pt discharged self ambulatory in satisfactory condition

## 2017-09-24 ENCOUNTER — Other Ambulatory Visit: Payer: Self-pay

## 2017-09-24 ENCOUNTER — Emergency Department (HOSPITAL_COMMUNITY): Payer: Medicare Other

## 2017-09-24 ENCOUNTER — Encounter (HOSPITAL_COMMUNITY): Payer: Self-pay | Admitting: Emergency Medicine

## 2017-09-24 ENCOUNTER — Emergency Department (HOSPITAL_COMMUNITY)
Admission: EM | Admit: 2017-09-24 | Discharge: 2017-09-24 | Disposition: A | Discharge status: Home / Self Care | Payer: Medicare Other | Attending: Emergency Medicine | Admitting: Emergency Medicine

## 2017-09-24 DIAGNOSIS — Z8673 Personal history of transient ischemic attack (TIA), and cerebral infarction without residual deficits: Secondary | ICD-10-CM | POA: Insufficient documentation

## 2017-09-24 DIAGNOSIS — I130 Hypertensive heart and chronic kidney disease with heart failure and stage 1 through stage 4 chronic kidney disease, or unspecified chronic kidney disease: Secondary | ICD-10-CM | POA: Diagnosis not present

## 2017-09-24 DIAGNOSIS — Z87891 Personal history of nicotine dependence: Secondary | ICD-10-CM | POA: Diagnosis not present

## 2017-09-24 DIAGNOSIS — Z7901 Long term (current) use of anticoagulants: Secondary | ICD-10-CM | POA: Diagnosis not present

## 2017-09-24 DIAGNOSIS — J40 Bronchitis, not specified as acute or chronic: Secondary | ICD-10-CM | POA: Diagnosis not present

## 2017-09-24 DIAGNOSIS — I5032 Chronic diastolic (congestive) heart failure: Secondary | ICD-10-CM | POA: Diagnosis not present

## 2017-09-24 DIAGNOSIS — N183 Chronic kidney disease, stage 3 (moderate): Secondary | ICD-10-CM | POA: Diagnosis not present

## 2017-09-24 DIAGNOSIS — Z79899 Other long term (current) drug therapy: Secondary | ICD-10-CM | POA: Insufficient documentation

## 2017-09-24 DIAGNOSIS — R0789 Other chest pain: Secondary | ICD-10-CM | POA: Diagnosis present

## 2017-09-24 DIAGNOSIS — Z85038 Personal history of other malignant neoplasm of large intestine: Secondary | ICD-10-CM | POA: Insufficient documentation

## 2017-09-24 DIAGNOSIS — R079 Chest pain, unspecified: Secondary | ICD-10-CM

## 2017-09-24 LAB — TROPONIN I
Troponin I: <0.03 ng/mL (ref <0.03)
Troponin I: <0.03 ng/mL (ref <0.03)

## 2017-09-24 LAB — BASIC METABOLIC PANEL
ANION GAP: 5 (ref 5–15)
BUN: 7 mg/dL — ABNORMAL LOW (ref 8–23)
CHLORIDE: 111 mmol/L (ref 98–111)
CO2: 23 mmol/L (ref 22–32)
Calcium: 8 mg/dL — ABNORMAL LOW (ref 8.9–10.3)
Creatinine, Ser: 1.32 mg/dL — ABNORMAL HIGH (ref 0.61–1.24)
GFR calc Af Amer: >60 mL/min (ref >60)
GFR calc non Af Amer: 55 mL/min — ABNORMAL LOW (ref >60)
GLUCOSE: 98 mg/dL (ref 70–99)
POTASSIUM: 4.7 mmol/L (ref 3.5–5.1)
Sodium: 139 mmol/L (ref 135–145)

## 2017-09-24 LAB — CBC
HEMATOCRIT: 42.5 % (ref 39.0–52.0)
HEMOGLOBIN: 13.8 g/dL (ref 13.0–17.0)
MCH: 30.7 pg (ref 26.0–34.0)
MCHC: 32.5 g/dL (ref 30.0–36.0)
MCV: 94.7 fL (ref 78.0–100.0)
Platelets: 129 10*3/uL — ABNORMAL LOW (ref 150–400)
RBC: 4.49 MIL/uL (ref 4.22–5.81)
RDW: 14.2 % (ref 11.5–15.5)
WBC: 7.6 10*3/uL (ref 4.0–10.5)

## 2017-09-24 LAB — PROTIME-INR
INR: 0.98
Prothrombin Time: 12.9 seconds (ref 11.4–15.2)

## 2017-09-24 MED ORDER — GI COCKTAIL ~~LOC~~
30.0000 mL | Freq: Once | ORAL | Status: AC
  Administered 2017-09-24: 30 mL via ORAL
  Filled 2017-09-24: qty 30

## 2017-09-24 MED ORDER — MORPHINE SULFATE (PF) 4 MG/ML IV SOLN
4.0000 mg | Freq: Once | INTRAVENOUS | Status: AC
Start: 2017-09-24 — End: 2017-09-24
  Administered 2017-09-24: 4 mg via INTRAVENOUS
  Filled 2017-09-24: qty 1

## 2017-09-24 MED ORDER — ONDANSETRON HCL 4 MG/2ML IJ SOLN
4.0000 mg | Freq: Once | INTRAMUSCULAR | Status: AC
  Administered 2017-09-24: 4 mg via INTRAVENOUS
  Filled 2017-09-24: qty 2

## 2017-09-24 MED ORDER — HEPARIN SOD (PORK) LOCK FLUSH 100 UNIT/ML IV SOLN
INTRAVENOUS | Status: AC
  Filled 2017-09-24: qty 5

## 2017-09-24 MED ORDER — IOPAMIDOL (ISOVUE-370) INJECTION 76%
80.0000 mL | Freq: Once | INTRAVENOUS | Status: AC | PRN
  Administered 2017-09-24: 80 mL via INTRAVENOUS

## 2017-09-24 MED ORDER — ASPIRIN 81 MG PO CHEW
324.0000 mg | CHEWABLE_TABLET | Freq: Once | ORAL | Status: AC
  Administered 2017-09-24: 324 mg via ORAL
  Filled 2017-09-24: qty 4

## 2017-09-24 NOTE — ED Provider Notes (Signed)
CT chest shows no PE no pneumonia.  Patient has had a cough for couple days with yellow sputum production.  He is nontoxic.  We will hold off of any type antibiotics for now   Milton Ferguson, MD 09/24/17 1737

## 2017-09-24 NOTE — ED Notes (Signed)
Pt returned from xray

## 2017-09-24 NOTE — ED Notes (Signed)
EDP at bedside  

## 2017-09-24 NOTE — ED Notes (Signed)
EDP at bedside updating patient. 

## 2017-09-24 NOTE — ED Notes (Signed)
Patient transported to X-ray 

## 2017-09-24 NOTE — Discharge Instructions (Addendum)
Follow up with your  md next week if not improving

## 2017-09-24 NOTE — ED Notes (Signed)
Pt c/o increasing CP. Rates it 7/10. EDP notified. Verbal orders for 4mg  of morphine given.

## 2017-09-24 NOTE — ED Notes (Signed)
Patient transported to CT 

## 2017-09-24 NOTE — ED Provider Notes (Signed)
[facility] EMERGENCY DEPARTMENT Provider Note   CSN: [id number] Arrival date & time: 09/24/17  1233     History   Chief Complaint Chief Complaint  Patient presents with  . Chest Pain    HPI [name] is a 67 y.o. male.  HPI Patient presents to the emergency room for evaluation of chest pain that started this morning at about 10 AM.  Patient states he was sitting in his chair reading when the symptoms started.  Its of burning discomfort on the left side of his chest.  It occasionally radiates down towards his abdomen.  Patient has had some nausea but has had some shortness of breath.  He has been coughing any has had some wheezing but denies any fevers.  Patient does not have any history of coronary artery disease.  He apparently has had outpatient stress test that were negative.  Patient does have a history of colon cancer that is currently in remission.  He also has a history of pulmonary embolism and is currently on anticoagulation.  He has not missed any doses.  CP onset 10 am.  Sitting in the chair reading. Burning, left side.  Radiates down. Some nausea, and shortness of breath.  Past Medical History:  Diagnosis Date  . 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 Anemia   . Anxiety   . Arthritis   . Barrett's esophagus   . Blood transfusion   . Bowel obstruction ([place]) 05/13/2012   Recurrent  . Bronchitis   . Chest pain at rest   . Chronic abdominal pain   . Erosive esophagitis   . ETOH abuse    quit 03/2010  . GERD (gastroesophageal reflux disease)   . Hx of Clostridium difficile infection 01/2012  . Hypertension   . Ileus ([place])   . Iron deficiency anemia 03/23/2016  . Obstruction of bowel ([place]) 03/03/14  . Osteoporosis   . Personal history of PE (pulmonary embolism) 10/01/2010  . Pneumonia   . Pulmonary embolism ([place]) 02/2010  . Recurrent upper respiratory infection (URI)   . S/P endoscopy September 28, 2010   erosive reflux esophagitis,  Billroth I anatomy  . 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 Seizures ([place])   . Shortness of breath   . TIA (transient ischemic attack) 10/11  . Vitamin B12 deficiency     Patient Active Problem List   Diagnosis Date Noted  . Seizure disorder ([place]) 09/16/2016  . CKD (chronic kidney disease) stage 3, GFR 30-59 ml/min (HCC) 06/14/2016  . Patient has nasogastric tube   . Ileus ([place]) 05/15/2016  . Chronic diastolic heart failure ([place]) 05/15/2016  . Iron deficiency anemia 03/23/2016  . Acute renal failure ([place]) 03/19/2016  . Normocytic anemia 03/19/2016  . Hypotension 03/19/2016  . Anemia   . History of colon cancer, stage III   . Hx of colon cancer, stage III   . History of colonic polyps   . Barrett's esophagus without dysplasia   . Dysphagia   . Essential hypertension 11/30/2014  . Dysphagia, pharyngoesophageal phase   . Mucosal abnormality of esophagus   . SBO (small bowel obstruction) ([place]) 09/17/2013  . Rectal bleeding 07/11/2012  . Bowel obstruction ([place]) 07/11/2012  . Abnormal CT scan, colon 07/11/2012  . Esophageal dysphagia 02/22/2012  . H/O Clostridium difficile infection 02/22/2012  . Diarrhea 09/10/2011  . Cellulitis 09/10/2011  . Nausea 06/07/2011  . Abdominal distention 06/07/2011  . HTN (hypertension) 06/07/2011  . Partial  small bowel obstruction ([place]) 05/31/2011  . GI bleed 05/27/2011  . Coagulopathy ([place]) 05/27/2011  . Gastroenteritis 05/27/2011  . Small bowel obstruction ([place]) 05/13/2011  . LUQ pain 05/13/2011  . Pulmonary embolism ([place]) 01/17/2011  . Chest pain at rest 01/17/2011  . Pneumonia 12/11/2010  . Hematemesis 12/05/2010  . Chronic abdominal pain 12/05/2010  . Pancytopenia due to antineoplastic chemotherapy ([place]) 12/05/2010  . Coffee ground emesis 11/29/2010  . Esophageal reflux disease 11/15/2010  . 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 Personal history of PE (pulmonary embolism) 10/01/2010  . Bronchitis, acute 10/01/2010  . Erosive  esophagitis 10/01/2010  . Fever chills 09/29/2010  . Bleeding gastrointestinal 09/26/2010  . Supratherapeutic INR 09/26/2010  . Abdominal pain 09/26/2010  . Nausea and vomiting 09/26/2010  . Anemia, chronic disease 09/26/2010  . Adenocarcinoma of colon with mucinous features 09/22/2010    Past Surgical History:  Procedure Laterality Date  . ABDOMINAL ADHESION SURGERY  03/04/15   @ [facility]  . ABDOMINAL EXPLORATION SURGERY    . abdominal sugery     for bowel obstruction x 8, all in 1980s, except for one in 07/2010  . APPENDECTOMY  1980s  . Billroth 1 hemigastrectomy  1980s   per patient for benign duodenal tumor  . CARDIAC CATHETERIZATION  07/17/2012  . CHOLECYSTECTOMY  1980s  . COLON SURGERY  May 2012   left hemicolectomy, colon cancer found at time of surgery for bowel obstruction  . COLONOSCOPY  03/18/2011   anastomosis at 35cm. Several adenomatous polyps removed. Sigmoid diverticulosis. Next TCS 02/2013  . COLONOSCOPY N/A 07/24/2012   JXB:JYNWGN post segmental resection with normal-appearing colonic anastomosis aside from an adjacent polyp-removed as described above. Rectal polyp-removed as described above. CT findings appear to have been artifactual. tubular adenomas/prolapsed type polyp.  . COLONOSCOPY N/A 05/15/2015   Procedure: COLONOSCOPY;  Surgeon: [name], MD;  Location: [facility];  Service: Endoscopy;  Laterality: N/A;  . COLONOSCOPY WITH PROPOFOL N/A 11/04/2016   Procedure: COLONOSCOPY WITH PROPOFOL;  Surgeon: [name], MD;  Location: [facility];  Service: Endoscopy;  Laterality: N/A;  945   . ESOPHAGOGASTRODUODENOSCOPY  09/28/2010  . ESOPHAGOGASTRODUODENOSCOPY  12/01/2010   Cervical web status post dilation, erosive esophagitis, B1 hemigastrectomy, inflamed anastomosis  . ESOPHAGOGASTRODUODENOSCOPY  04/16/2011   excoriation at GEJ c/w trauma/M-W tear, friable gastric anastomosis, dilation efferent limb  . ESOPHAGOGASTRODUODENOSCOPY N/A 06/03/2014   Dr.[name]-  cervcal esopphageal web s/p dilation. abnormal distal esophagus bx= barretts esophagus  . ESOPHAGOGASTRODUODENOSCOPY N/A 05/15/2015   Procedure: ESOPHAGOGASTRODUODENOSCOPY (EGD);  Surgeon: [name], MD;  Location: [facility];  Service: Endoscopy;  Laterality: N/A;  230  . ESOPHAGOGASTRODUODENOSCOPY (EGD) WITH ESOPHAGEAL DILATION  02/25/2012   FAO:ZHYQMVHQ esophageal web-s/p dilation anddisruption as described above. Status post prior gastric with Billroth I configuration. Abnormal gastric mucosa at the anastomosis. Gastric biopsy showed mild chronic inflammation but no H. pylori   . ESOPHAGOGASTRODUODENOSCOPY (EGD) WITH PROPOFOL N/A 11/04/2016   Procedure: ESOPHAGOGASTRODUODENOSCOPY (EGD) WITH PROPOFOL;  Surgeon: [name], MD;  [facility];  Service: Endoscopy;  Laterality: N/A;  . HERNIA REPAIR     right inguinal  . MALONEY DILATION N/A 06/03/2014   Procedure: [name] DILATION;  Surgeon: [name], MD;  Location: [facility];  Service: Endoscopy;  Laterality: N/A;  [name] DILATION N/A 05/15/2015   Procedure: [name] DILATION;  Surgeon: [name], MD;  [facility];  Service: Endoscopy;  Laterality: N/A;  . MALONEY  DILATION N/A 11/04/2016   Procedure: [name] DILATION;  Surgeon: [name], MD;  [facility]: [facility];  Service: Endoscopy;  Laterality: N/A;  . PORTACATH PLACEMENT    . SAVORY DILATION N/A 06/03/2014   Procedure: SAVORY DILATION;  Surgeon: [name], MD;  [facility]: [facility];  Service: Endoscopy;  Laterality: N/A;        Home Medications    Prior to Admission medications   Medication Sig Start Date End Date Taking? Authorizing Provider  atorvastatin (LIPITOR) 20 MG tablet Take 20 mg by mouth at bedtime.    Yes [provider]  carvedilol (COREG) 25 MG tablet Take 25 mg by mouth 2 (two) times daily. as directed 08/16/17  Yes [provider]  Cholecalciferol (VITAMIN D) 2000 units CAPS Take 2,000  Units by mouth daily.   Yes [provider]  cyanocobalamin (,VITAMIN B-12,) 1000 MCG/ML injection Inject 1,000 mcg into the muscle every 30 (thirty) days.   Yes [provider]  diphenoxylate-atropine (LOMOTIL) 2.5-0.025 MG tablet Take 2 tablets by mouth 4 (four) times daily as needed for diarrhea or loose stools. 09/02/17  Yes Pollina, Gwenyth Allegra, MD  docusate sodium (COLACE) 100 MG capsule Take 200 mg by mouth daily as needed for mild constipation.    Yes [provider]  enoxaparin (LOVENOX) 60 MG/0.6ML injection Inject 60 mg into the skin at bedtime.    Yes [provider]  lansoprazole (PREVACID) 30 MG capsule TAKE 1 CAPSULE BY MOUTH TWICE DAILY BEFORE  A  MEAL 09/05/17  Yes Annitta Needs, NP  LORazepam (ATIVAN) 1 MG tablet Take 1 tablet (1 mg total) by mouth every 8 (eight) hours as needed for anxiety. Patient taking differently: Take 1 mg by mouth daily.  05/23/16  Yes Elgergawy, Silver Huguenin, MD  Multiple Vitamin (MULTIVITAMIN WITH MINERALS) TABS tablet Take 1 tablet by mouth daily.   Yes [provider]  Nutritional Supplements (ENSURE PLUS HN) LIQD Take 1 Bottle daily by mouth.    Yes [provider]  ondansetron (ZOFRAN ODT) 4 MG disintegrating tablet Take 1 tablet (4 mg total) by mouth every 8 (eight) hours as needed for nausea or vomiting. 12/21/16  Yes Long, Wonda Olds, MD  polyethylene glycol (MIRALAX / GLYCOLAX) packet Take 17 g by mouth daily. Patient taking differently: Take 17 g by mouth daily as needed for mild constipation.  09/19/16  Yes Kathie Dike, MD  Probiotic Product (PROBIOTIC PO) Take 1 tablet by mouth daily.   Yes [provider]  prochlorperazine (COMPAZINE) 10 MG tablet Take 10 mg by mouth every 6 (six) hours as needed for nausea or vomiting.    Yes [provider]  traMADol (ULTRAM) 50 MG tablet Take 1 tablet (50 mg total) by mouth every 6 (six) hours as needed. 12/21/16  Yes Long, Wonda Olds, MD  Vitamin  D, Ergocalciferol, (DRISDOL) 50000 units CAPS capsule Take 50,000 Units by mouth every 30 (thirty) days.    Yes [provider]  zoledronic acid (RECLAST) 5 MG/100ML SOLN injection Inject 5 mg into the vein See admin instructions. Pt gets a dose yearly. Due again 2018   Yes [provider]  lidocaine-prilocaine (EMLA) cream Apply 1 application topically as needed (prior to accessing port). Patient not taking: Reported on 09/24/2017 05/16/17   Holley Bouche, NP  magnesium oxide (MAGNESIUM-OXIDE) 400 (241.3 Mg) MG tablet Take 400 mg by mouth at bedtime.     [provider]  mirtazapine (REMERON)  30 MG tablet Take 1 tablet (30 mg total) by mouth at bedtime. Take 1/2 pill for first 7 days. Then take 1 whole pill daily thereafter. Patient not taking: Reported on 09/24/2017 06/01/17   Holley Bouche, NP  tamsulosin (FLOMAX) 0.4 MG CAPS capsule Take 0.4 mg by mouth at bedtime.     [provider]    Family History Family History  Problem Relation Age of Onset  . Hypertension Mother   . Arthritis Mother   . Pneumonia Mother   . Hypertension Father   . Heart attack Father   . Colon cancer Neg Hx     Social History Social History   Tobacco Use  . Smoking status: Former Smoker    Packs/day: 0.50    Years: 40.00    Pack years: 20.00    Types: Cigarettes    Last attempt to quit: 12/20/2012    Years since quitting: 4.7  . Smokeless tobacco: Never Used  Substance Use Topics  . Alcohol use: No  . Drug use: No     Allergies   Patient has no known allergies.   Review of Systems Review of Systems  Constitutional: Negative for fever.  Respiratory: Positive for cough and wheezing.      Physical Exam Updated Vital Signs BP 130/82   Pulse 69   Temp 97.9 F (36.6 C) (Oral)   Resp 15   SpO2 96%   Physical Exam  Constitutional: He appears well-developed and well-nourished. No distress.  HENT:  Head: Normocephalic and atraumatic.  Right Ear:  External ear normal.  Left Ear: External ear normal.  Eyes: Conjunctivae are normal. Right eye exhibits no discharge. Left eye exhibits no discharge. No scleral icterus.  Neck: Neck supple. No tracheal deviation present.  Cardiovascular: Normal rate, regular rhythm and intact distal pulses.  Pulmonary/Chest: Effort normal and breath sounds normal. No stridor. No respiratory distress. He has no wheezes. He has no rales.  Abdominal: Soft. Bowel sounds are normal. He exhibits no distension. There is no tenderness. There is no rebound and no guarding.  Musculoskeletal: He exhibits no edema or tenderness.  Neurological: He is alert. He has normal strength. No cranial nerve deficit (no facial droop, extraocular movements intact, no slurred speech) or sensory deficit. He exhibits normal muscle tone. He displays no seizure activity. Coordination normal.  Skin: Skin is warm and dry. No rash noted.  Psychiatric: He has a normal mood and affect.  Nursing note and vitals reviewed.    ED Treatments / Results  Labs (all labs ordered are listed, but only abnormal results are displayed) Labs Reviewed  BASIC METABOLIC PANEL - Abnormal; Notable for the following components:      Result Value   BUN 7 (*)    Creatinine, Ser 1.32 (*)    Calcium 8.0 (*)    GFR calc non Af Amer 55 (*)    All other components within normal limits  CBC - Abnormal; Notable for the following components:   Platelets 129 (*)    All other components within normal limits  TROPONIN I  PROTIME-INR  TROPONIN I    EKG EKG Interpretation  Date/Time:  Saturday September 24 2017 12:45:17 EDT Ventricular Rate:  77 PR Interval:    QRS Duration: 85 QT Interval:  359 QTC Calculation: 407 R Axis:   -6 Text Interpretation:  Sinus rhythm Abnormal R-wave progression, early transition LVH with secondary repolarization abnormality Inferior infarct, old t wave changes in v5 and v6 compared to  prior ecg Confirmed by Dorie Rank [id number]) on  09/24/2017 12:49:26 PM   Radiology Dg Chest 2 View  Result Date: 09/24/2017 CLINICAL DATA:  Chest pain starting this morning. EXAM: CHEST - 2 VIEW COMPARISON:  Chest x-rays dated 02/04/2017 and 10/16/2016. FINDINGS: Linear opacity within the LEFT upper lung, of uncertain etiology, possibly focal bronchitic change or early developing pneumonia. Lungs otherwise clear. No pleural effusion or pneumothorax seen. Lungs are hyperexpanded. Heart size and mediastinal contours are stable. LEFT chest wall Port-A-Cath is stable in position with tip at the level of the upper SVC. No acute or suspicious osseous finding. Chronic compression fracture deformity within the midthoracic spine. IMPRESSION: 1. New linear opacity in the LEFT suprahilar lung, of uncertain etiology or significance, possibly focal acute bronchitic change or early developing pneumonia. Consider chest CT for further characterization. 2. Hyperexpanded lungs suggesting COPD. Electronically Signed   By: Franki Cabot M.D.   On: 09/24/2017 13:27    Procedures Procedures (including critical care time)  Medications Ordered in ED Medications  aspirin chewable tablet 324 mg (324 mg Oral Given 09/24/17 1317)  gi cocktail (Maalox,Lidocaine,Donnatal) (30 mLs Oral Given 09/24/17 1317)  ondansetron (ZOFRAN) injection 4 mg (4 mg Intravenous Given 09/24/17 1317)  morphine 4 MG/ML injection 4 mg (4 mg Intravenous Given 09/24/17 1544)  iopamidol (ISOVUE-370) 76 % injection 80 mL (80 mLs Intravenous Contrast Given 09/24/17 1555)     Initial Impression / Assessment and Plan / ED Course  I have reviewed the triage vital signs and the nursing notes.  Pertinent labs & imaging results that were available during my care of the patient were reviewed by me and considered in my medical decision making (see chart for details).  Clinical Course as of Sep 24 1616  Sat Sep 24, 2017  1618 Initial troponin is normal.  CBC and electrolyte panel are unremarkable.  Chest x-ray  findings noted   [JK]    Clinical Course User Index [JK] Dorie Rank, MD  Patient presented to the emergency room for evaluation of chest pain.  Initial cardiac enzymes are negative.  Patient symptoms are somewhat atypical and he has had some respiratory complaints.  Chest x-ray suggest the possibility of pneumonia.  Plan on checking a delta troponin.  CT Angie of the chest was also ordered to rule out any recurrent PEs and also evaluate that area of possible pneumonia.  The patient's cardiac enzymes are normal and the CT scan of the chest does not show any evidence of pulmonary embolism I think it would be reasonable to treat the patient with outpatient antibiotics.  Dr. Roderic Palau will follow up on the test results.  Final Clinical Impressions(s) / ED Diagnoses   Final diagnoses:  Chest pain, unspecified type      Dorie Rank, MD 09/24/17 [phone]

## 2017-09-24 NOTE — ED Triage Notes (Signed)
Pt c/o L sided CP that started at 10 AM. N/, SOB, and dizziness.

## 2017-10-18 ENCOUNTER — Encounter: Payer: Self-pay | Admitting: Gastroenterology

## 2017-10-18 ENCOUNTER — Ambulatory Visit (INDEPENDENT_AMBULATORY_CARE_PROVIDER_SITE_OTHER): Payer: Medicare Other | Admitting: Gastroenterology

## 2017-10-18 VITALS — BP 152/88 | HR 63 | Temp 98.0°F | Ht 69.0 in | Wt 152.4 lb

## 2017-10-18 DIAGNOSIS — D509 Iron deficiency anemia, unspecified: Secondary | ICD-10-CM

## 2017-10-18 DIAGNOSIS — R197 Diarrhea, unspecified: Secondary | ICD-10-CM

## 2017-10-18 DIAGNOSIS — Z85038 Personal history of other malignant neoplasm of large intestine: Secondary | ICD-10-CM | POA: Diagnosis not present

## 2017-10-18 NOTE — Patient Instructions (Signed)
1. Please have your labs and stool test done. We will be in touch with results as available.

## 2017-10-18 NOTE — Progress Notes (Signed)
Primary Care Physician: [name], MD  Primary Gastroenterologist:  Garfield Cornea, MD   Chief Complaint  Patient presents with  . Diarrhea  . Weight Loss    lost approx 14 lbs in past 6 weeks    HPI: [name] is a 67 y.o. male here for follow-up.  Last seen in November 2018.  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  He had an EGD and colonoscopy in August 2018 was noted to have reflux esophagitis, Barrett's esophagus without dysplasia, small hiatal hernia, status post hemigastrectomy, single patent efferent small bowel limb appeared normal.  Esophagus dilated up to 58 [place] Maloney dilator.  Esophageal biopsies with reflux changes, no Barrett's noted although this was confirmed on prior endoscopies.  Colonoscopy performed for high risk colon cancer surveillance given personal history of colon cancer and iron deficiency anemia.  He has evidence of subtotal colectomy with normal-appearing residual lower GI tract.  Next colonoscopy planned years.  His last visit was concentrated around dysphagia.  75% improved after dilation.  Barium pill esophagram showed no evidence of esophageal stricture.  Back in February I was contacted by oncology regarding patient having a bump in his LFTs.  His AST bumped up to 1500, ALT over 500, alkaline phosphatase 246, bilirubin normal. LFTs rechecked 2 weeks later were entirely normal.  They have remained normal on a couple of checks since then, most recently in June.  Viral hepatitis panel was negative for hepatitis C Ab, hepatitis B core IgM.  His hepatitis A IgM was +2 years ago and then again in February, without signs of acute hepatitis A.  Suspect prolonged antibody versus false positive.  He had a CT abdomen pelvis with contrast on June 14 when he presented to the emergency department with  abdominal pain.[name]  He had a small subcentimeter hypodensity in the left lobe of the liver too small to characterize but likely a cyst, unchanged from prior study in October 2018.  Similar appearance of dilated tortuous common bile duct with mild intrahepatic biliary ductal dilation, no choledocholithiasis.  Pancreatic ductal prominence measuring 3 to 4 mm similar to prior exam.  No pancreatic masses.  Patient complains of postprandial watery stools for past 6-8 weeks. No nocturnal stools.  He has a bowel movement every time he eats.  No melena, brbpr. Some epigastric pain right after meals. Early satiety new symptoms. No vomiting but frequent nausea. Poor appetite.  He reports getting up to 164 pounds and is dropped about 11 pounds in the past 6 weeks.  By our documentation he has lost 3 pounds.  Patient was given Lomotil during ED visit but noted no improvement in his symptoms.Mother in law has had Cdiff recently. Patient around her in May. He had Cdiff about six years ago.    Current Outpatient Medications  Medication Sig Dispense Refill  . atorvastatin (LIPITOR) 20 MG tablet Take 20 mg by mouth at bedtime.     . carvedilol (COREG) 25 MG tablet Take 25 mg by mouth 2 (two) times daily. as directed  3  . Cholecalciferol (VITAMIN D) 2000 units CAPS Take 2,000 Units by mouth daily.    [name] docusate sodium (COLACE) 100 MG capsule Take 200 mg by mouth daily as needed for mild constipation.     . lansoprazole (PREVACID) 30 MG capsule TAKE 1 CAPSULE BY MOUTH TWICE  DAILY BEFORE  A  MEAL 180 capsule 1  . lidocaine-prilocaine (EMLA) cream Apply 1 application topically as needed (prior to accessing port). 30 g 1  . LORazepam (ATIVAN) 1 MG tablet Take 1 tablet (1 mg total) by mouth every 8 (eight) hours as needed for anxiety. (Patient taking differently: Take 1 mg by mouth daily. ) 30 tablet 0  . mirtazapine (REMERON) 30 MG tablet Take 1 tablet (30 mg total) by mouth at bedtime. Take 1/2 pill for first 7 days. Then  take 1 whole pill daily thereafter. 60 tablet 1  . Multiple Vitamin (MULTIVITAMIN WITH MINERALS) TABS tablet Take 1 tablet by mouth daily.    . Nutritional Supplements (ENSURE PLUS HN) LIQD Take 1 Bottle daily by mouth.     . ondansetron (ZOFRAN ODT) 4 MG disintegrating tablet Take 1 tablet (4 mg total) by mouth every 8 (eight) hours as needed for nausea or vomiting. 20 tablet 0  . polyethylene glycol (MIRALAX / GLYCOLAX) packet Take 17 g by mouth daily. (Patient taking differently: Take 17 g by mouth daily as needed for mild constipation. ) 14 each 0  . Probiotic Product (PROBIOTIC PO) Take 1 tablet by mouth daily.    . prochlorperazine (COMPAZINE) 10 MG tablet Take 10 mg by mouth every 6 (six) hours as needed for nausea or vomiting.     . traMADol (ULTRAM) 50 MG tablet Take 1 tablet (50 mg total) by mouth every 6 (six) hours as needed. 15 tablet 0  . Vitamin D, Ergocalciferol, (DRISDOL) 50000 units CAPS capsule Take 50,000 Units by mouth every 30 (thirty) days.     . zoledronic acid (RECLAST) 5 MG/100ML SOLN injection Inject 5 mg into the vein See admin instructions. Pt gets a dose yearly. Due again 2018    . cyanocobalamin (,VITAMIN B-12,) 1000 MCG/ML injection Inject 1,000 mcg into the muscle every 30 (thirty) days.     No current facility-administered medications for this visit.    Facility-Administered Medications Ordered in Other Visits  Medication Dose Route Frequency Provider Last Rate Last Dose  . heparin lock flush 100 unit/mL  500 Units Intravenous Once Kefalas, Thomas S, PA-C      . sodium chloride flush (NS) 0.9 % injection 10 mL  10 mL Intravenous PRN Holley Bouche, NP   10 mL at 11/05/16 1136    Allergies as of 10/18/2017  . (No Known Allergies)    ROS:  General: Negative for anorexia,   fever, chills, fatigue, weakness. See hpi ENT: Negative for hoarseness, difficulty swallowing , nasal congestion. CV: Negative for chest pain, angina, palpitations, dyspnea on  exertion, peripheral edema.  Respiratory: Negative for dyspnea at rest, dyspnea on exertion, cough, sputum, wheezing.  GI: See history of present illness. GU:  Negative for dysuria, hematuria, urinary incontinence, urinary frequency, nocturnal urination.  Endo: Negative for unusual weight change.    Physical Examination:   BP (!) 152/88   Pulse 63   Temp 98 F (36.7 C) (Oral)   Ht 5\' 9"  (1.753 m)   Wt 152 lb 6.4 oz (69.1 kg)   BMI 22.51 kg/m   General: Well-nourished, well-developed in no acute distress.  Eyes: No icterus. Mouth: Oropharyngeal mucosa moist and pink , no lesions erythema or exudate. Lungs: Clear to auscultation bilaterally.  Heart: Regular rate and rhythm, no murmurs rubs or gallops.  Abdomen: Bowel sounds are normal, nontender, nondistended, no hepatosplenomegaly or masses, no abdominal bruits or hernia , no rebound or guarding.  Extremities: No lower extremity edema. No clubbing or deformities. Neuro: Alert and oriented x 4   Skin: Warm and dry, no jaundice.   Psych: Alert and cooperative, normal mood and affect.  Labs:  Lab Results  Component Value Date   CREATININE 1.32 (H) 09/24/2017   BUN 7 (L) 09/24/2017   NA 139 09/24/2017   K 4.7 09/24/2017   CL 111 09/24/2017   CO2 23 09/24/2017   Lab Results  Component Value Date   WBC 7.6 09/24/2017   HGB 13.8 09/24/2017   HCT 42.5 09/24/2017   MCV 94.7 09/24/2017   PLT 129 (L) 09/24/2017   Lab Results  Component Value Date   ALT 16 (L) 09/02/2017   AST 18 09/02/2017   ALKPHOS 75 09/02/2017   BILITOT 0.6 09/02/2017   Lab Results  Component Value Date   IRON 82 07/27/2017   TIBC 314 07/27/2017   FERRITIN 30 07/27/2017   Lab Results  Component Value Date   VITAMINB12 469 04/30/2016    Imaging Studies: Dg Chest 2 View  Result Date: 09/24/2017 CLINICAL DATA:  Chest pain starting this morning. EXAM: CHEST - 2 VIEW COMPARISON:  Chest x-rays dated 02/04/2017 and 10/16/2016. FINDINGS: Linear  opacity within the LEFT upper lung, of uncertain etiology, possibly focal bronchitic change or early developing pneumonia. Lungs otherwise clear. No pleural effusion or pneumothorax seen. Lungs are hyperexpanded. Heart size and mediastinal contours are stable. LEFT chest wall Port-A-Cath is stable in position with tip at the level of the upper SVC. No acute or suspicious osseous finding. Chronic compression fracture deformity within the midthoracic spine. IMPRESSION: 1. New linear opacity in the LEFT suprahilar lung, of uncertain etiology or significance, possibly focal acute bronchitic change or early developing pneumonia. Consider chest CT for further characterization. 2. Hyperexpanded lungs suggesting COPD. Electronically Signed   By: Franki Cabot M.D.   On: 09/24/2017 13:27   Ct Angio Chest Pe W And/or Wo Contrast  Result Date: 09/24/2017 CLINICAL DATA:  LEFT-sided chest pain starting this morning. Abnormal chest x-ray. Pulmonary embolism suspected, high pretest probability. EXAM: CT ANGIOGRAPHY CHEST WITH CONTRAST TECHNIQUE: Multidetector CT imaging of the chest was performed using the standard protocol during bolus administration of intravenous contrast. Multiplanar CT image reconstructions and MIPs were obtained to evaluate the vascular anatomy. CONTRAST:  31mL ISOVUE-370 IOPAMIDOL (ISOVUE-370) INJECTION 76% COMPARISON:  Chest x-ray from earlier same day. Chest x-ray dated 02/04/2017. FINDINGS: Cardiovascular: There is no pulmonary embolism identified within the main, lobar or segmental pulmonary arteries bilaterally. Heart size is upper normal. No pericardial effusion. No thoracic aortic aneurysm or evidence of aortic dissection. Mild aortic atherosclerosis. Mediastinum/Nodes: No mass or enlarged lymph nodes seen within the mediastinum or perihilar regions. Trachea and central bronchi are unremarkable. Lungs/Pleura: Mild scarring/atelectasis at each lung base. Lungs otherwise clear. No pulmonary nodule  or mass. No evidence of consolidating pneumonia. No pleural effusion or pneumothorax. Upper Abdomen: Limited images of the upper abdomen are unremarkable. Surgical changes of gastric bypass, incompletely imaged. Musculoskeletal: Chronic compression fracture deformity within the midthoracic spine. Stable mild scoliosis of the thoracic spine. No acute or suspicious osseous finding. Review of the MIP images confirms the above findings. IMPRESSION: 1. No acute findings.  No pulmonary embolism. 2. Mild bibasilar scarring and/or atelectasis. Lungs otherwise clear. No evidence of pneumonia. No pulmonary nodule or mass. The linear opacity appreciated within the LEFT upper lobe on today's chest x-ray appears to be due to superimposition of normal LEFT upper lobe pulmonary arteries  and veins seen on this chest CT angiogram. Aortic Atherosclerosis (ICD10-I70.0). Electronically Signed   By: Franki Cabot M.D.   On: 09/24/2017 16:18

## 2017-10-18 NOTE — Assessment & Plan Note (Signed)
67 year old gentleman with history of stage III colon cancer with subtotal colectomy in 2012, status post remote partial gastrectomy for benign gastric tumor, history of small bowel obstruction, iron deficiency anemia, Barrett's esophagus who presents for 6 to 8-week history of postprandial watery stools, somewhat acute on chronic.  Some associated weight loss, postprandial epigastric discomfort and early satiety all new symptoms.  Patient has been exposed to C. difficile, mother-in-law.  Patient has a personal history of C. difficile in the past.  Would recommend stool studies.  Would also screen for celiac disease given IDA, weight loss, chronic diarrhea.  Currently up-to-date on colonoscopy as outlined above.

## 2017-10-19 LAB — TISSUE TRANSGLUTAMINASE, IGA: (tTG) Ab, IgA: 1 U/mL

## 2017-10-19 LAB — TSH: TSH: 1.02 m[IU]/L (ref 0.40–4.50)

## 2017-10-19 LAB — IGA: Immunoglobulin A: 147 mg/dL (ref 20–320)

## 2017-10-19 NOTE — Progress Notes (Signed)
cc'd to pcp 

## 2017-10-25 ENCOUNTER — Other Ambulatory Visit (HOSPITAL_COMMUNITY): Payer: Self-pay | Admitting: *Deleted

## 2017-10-25 DIAGNOSIS — R630 Anorexia: Secondary | ICD-10-CM

## 2017-10-25 DIAGNOSIS — C189 Malignant neoplasm of colon, unspecified: Secondary | ICD-10-CM

## 2017-10-25 MED ORDER — MIRTAZAPINE 30 MG PO TABS
30.0000 mg | ORAL_TABLET | Freq: Every day | ORAL | 1 refills | Status: DC
Start: 2017-10-25 — End: 2018-02-13

## 2017-10-27 LAB — GASTROINTESTINAL PATHOGEN PANEL PCR
C. difficile Tox A/B, PCR: NOT DETECTED
CAMPYLOBACTER, PCR: NOT DETECTED
CRYPTOSPORIDIUM, PCR: NOT DETECTED
E coli (ETEC) LT/ST PCR: NOT DETECTED
E coli (STEC) stx1/stx2, PCR: NOT DETECTED
E coli 0157, PCR: NOT DETECTED
Giardia lamblia, PCR: NOT DETECTED
Norovirus, PCR: NOT DETECTED
ROTAVIRUS, PCR: NOT DETECTED
Salmonella, PCR: NOT DETECTED
Shigella, PCR: NOT DETECTED

## 2017-10-27 LAB — C. DIFFICILE GDH AND TOXIN A/B
GDH ANTIGEN: NOT DETECTED
MICRO NUMBER: 90930968
SPECIMEN QUALITY: ADEQUATE
TOXIN A AND B: NOT DETECTED

## 2017-10-28 ENCOUNTER — Telehealth: Payer: Self-pay | Admitting: Internal Medicine

## 2017-10-28 NOTE — Progress Notes (Signed)
Stool tests negative. Further recommendations when [name] returns

## 2017-10-28 NOTE — Telephone Encounter (Signed)
Pt said he was returning a call from AM. I told him that AM would be back on Monday. Please call [phone]

## 2017-11-02 NOTE — Progress Notes (Signed)
PT is aware of results. [name] he is doing better. He still has diarrhea almost daily x 1, very loose, but not watery.  [name], please advise!

## 2017-11-02 NOTE — Progress Notes (Signed)
LMOM to call.

## 2017-11-04 ENCOUNTER — Encounter (HOSPITAL_COMMUNITY): Payer: Self-pay

## 2017-11-04 ENCOUNTER — Inpatient Hospital Stay (HOSPITAL_COMMUNITY): Payer: Medicare Other | Attending: Hematology

## 2017-11-04 ENCOUNTER — Other Ambulatory Visit: Payer: Self-pay

## 2017-11-04 DIAGNOSIS — Z452 Encounter for adjustment and management of vascular access device: Secondary | ICD-10-CM | POA: Insufficient documentation

## 2017-11-04 DIAGNOSIS — Z85038 Personal history of other malignant neoplasm of large intestine: Secondary | ICD-10-CM | POA: Insufficient documentation

## 2017-11-04 MED ORDER — SODIUM CHLORIDE 0.9% FLUSH
10.0000 mL | Freq: Once | INTRAVENOUS | Status: AC
  Administered 2017-11-04: 10 mL via INTRAVENOUS

## 2017-11-04 MED ORDER — HEPARIN SOD (PORK) LOCK FLUSH 100 UNIT/ML IV SOLN
500.0000 [IU] | Freq: Once | INTRAVENOUS | Status: AC
  Administered 2017-11-04: 500 [IU] via INTRAVENOUS

## 2017-11-04 NOTE — Progress Notes (Signed)
[name] presented for Portacath access and flush. Portacath located left chest wall accessed with  H 20 needle. Good blood return present. Portacath flushed with 85ml NS and 500U/78ml Heparin and needle removed intact. Procedure without incident. Patient tolerated procedure well.  . Vitals stable and discharged home from clinic ambulatory. Follow up as scheduled.

## 2017-11-07 ENCOUNTER — Encounter: Payer: Self-pay | Admitting: Internal Medicine

## 2017-11-07 NOTE — Progress Notes (Signed)
PT is aware.  Forwarding to [facility] for appt with Dr. [name] and for the nics.

## 2017-11-07 NOTE — Progress Notes (Signed)
PATIENT SCHEDULED AND ON RECALL  °

## 2017-11-25 ENCOUNTER — Encounter (HOSPITAL_COMMUNITY): Payer: Self-pay

## 2017-11-25 ENCOUNTER — Emergency Department (HOSPITAL_COMMUNITY)
Admission: EM | Admit: 2017-11-25 | Discharge: 2017-11-25 | Disposition: A | Discharge status: Home / Self Care | Payer: Medicare Other | Attending: Emergency Medicine | Admitting: Emergency Medicine

## 2017-11-25 ENCOUNTER — Other Ambulatory Visit: Payer: Self-pay

## 2017-11-25 ENCOUNTER — Emergency Department (HOSPITAL_COMMUNITY): Payer: Medicare Other

## 2017-11-25 DIAGNOSIS — R0600 Dyspnea, unspecified: Secondary | ICD-10-CM | POA: Insufficient documentation

## 2017-11-25 DIAGNOSIS — I5032 Chronic diastolic (congestive) heart failure: Secondary | ICD-10-CM | POA: Diagnosis not present

## 2017-11-25 DIAGNOSIS — R079 Chest pain, unspecified: Secondary | ICD-10-CM | POA: Diagnosis present

## 2017-11-25 DIAGNOSIS — N183 Chronic kidney disease, stage 3 (moderate): Secondary | ICD-10-CM | POA: Insufficient documentation

## 2017-11-25 DIAGNOSIS — I130 Hypertensive heart and chronic kidney disease with heart failure and stage 1 through stage 4 chronic kidney disease, or unspecified chronic kidney disease: Secondary | ICD-10-CM | POA: Insufficient documentation

## 2017-11-25 DIAGNOSIS — Z87891 Personal history of nicotine dependence: Secondary | ICD-10-CM | POA: Diagnosis not present

## 2017-11-25 DIAGNOSIS — Z79899 Other long term (current) drug therapy: Secondary | ICD-10-CM | POA: Diagnosis not present

## 2017-11-25 DIAGNOSIS — Z86711 Personal history of pulmonary embolism: Secondary | ICD-10-CM | POA: Insufficient documentation

## 2017-11-25 LAB — CBC
HCT: 42.1 % (ref 39.0–52.0)
Hemoglobin: 14.1 g/dL (ref 13.0–17.0)
MCH: 31.6 pg (ref 26.0–34.0)
MCHC: 33.5 g/dL (ref 30.0–36.0)
MCV: 94.4 fL (ref 78.0–100.0)
Platelets: 153 10*3/uL (ref 150–400)
RBC: 4.46 MIL/uL (ref 4.22–5.81)
RDW: 14.3 % (ref 11.5–15.5)
WBC: 10.4 10*3/uL (ref 4.0–10.5)

## 2017-11-25 LAB — BASIC METABOLIC PANEL
Anion gap: 8 (ref 5–15)
BUN: 9 mg/dL (ref 8–23)
CO2: 20 mmol/L — ABNORMAL LOW (ref 22–32)
Calcium: 8.8 mg/dL — ABNORMAL LOW (ref 8.9–10.3)
Chloride: 112 mmol/L — ABNORMAL HIGH (ref 98–111)
Creatinine, Ser: 1.18 mg/dL (ref 0.61–1.24)
GFR calc Af Amer: >60 mL/min (ref >60)
GFR calc non Af Amer: >60 mL/min (ref >60)
Glucose, Bld: 105 mg/dL — ABNORMAL HIGH (ref 70–99)
Potassium: 4.1 mmol/L (ref 3.5–5.1)
Sodium: 140 mmol/L (ref 135–145)

## 2017-11-25 LAB — TROPONIN I
Troponin I: <0.03 ng/mL (ref <0.03)
Troponin I: <0.03 ng/mL (ref <0.03)

## 2017-11-25 LAB — PROTIME-INR
INR: 0.98
Prothrombin Time: 12.9 seconds (ref 11.4–15.2)

## 2017-11-25 MED ORDER — ASPIRIN 81 MG PO CHEW
324.0000 mg | CHEWABLE_TABLET | Freq: Once | ORAL | Status: AC
  Administered 2017-11-25: 324 mg via ORAL
  Filled 2017-11-25: qty 4

## 2017-11-25 MED ORDER — ONDANSETRON HCL 4 MG/2ML IJ SOLN
4.0000 mg | Freq: Once | INTRAMUSCULAR | Status: AC
  Administered 2017-11-25: 4 mg via INTRAVENOUS

## 2017-11-25 MED ORDER — ONDANSETRON HCL 4 MG/2ML IJ SOLN
INTRAMUSCULAR | Status: AC
  Filled 2017-11-25: qty 2

## 2017-11-25 MED ORDER — HEPARIN SOD (PORK) LOCK FLUSH 100 UNIT/ML IV SOLN
INTRAVENOUS | Status: AC
  Filled 2017-11-25: qty 5

## 2017-11-25 MED ORDER — NITROGLYCERIN 0.4 MG SL SUBL
0.4000 mg | SUBLINGUAL_TABLET | Freq: Once | SUBLINGUAL | Status: AC
  Administered 2017-11-25: 0.4 mg via SUBLINGUAL
  Filled 2017-11-25: qty 1

## 2017-11-25 NOTE — ED Notes (Signed)
Flushed port with heparin prior to removal.

## 2017-11-25 NOTE — ED Notes (Signed)
Deaccessed port

## 2017-11-25 NOTE — ED Triage Notes (Signed)
Pt reports feeling fatigued for the past few days.  Started having chest pressure this morning while in the shower.  Reports lightheadedness as well.

## 2017-11-25 NOTE — ED Provider Notes (Signed)
[facility] EMERGENCY DEPARTMENT Provider Note   CSN: [id number] Arrival date & time: 11/25/17  1317     History   Chief Complaint Chief Complaint  Patient presents with  . Chest Pain    HPI [name] is a 67 y.o. male.  HPI   67 year old male with chest pain shortness of breath.  He has had intermittent dyspnea for the past 2 to 3 days.  He woke up twice last night feeling short of breath.  He did not have any chest pain until about 10:00 today as he was showering.  Describes a mild pressure sensation in the center of his chest associate with some mild nausea.  This has been constant since onset without any appreciable exacerbating relieving factors.  No fevers or chills.  No cough.  No unusual leg pain or swelling.  Past Medical History:  Diagnosis Date  . 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 Anemia   . Anxiety   . Arthritis   . Barrett's esophagus   . Blood transfusion   . Bowel obstruction ([place]) 05/13/2012   Recurrent  . Bronchitis   . Chest pain at rest   . Chronic abdominal pain   . Erosive esophagitis   . ETOH abuse    quit 03/2010  . GERD (gastroesophageal reflux disease)   . Hx of Clostridium difficile infection 01/2012  . Hypertension   . Ileus ([place])   . Iron deficiency anemia 03/23/2016  . Obstruction of bowel ([place]) 03/03/14  . Osteoporosis   . Personal history of PE (pulmonary embolism) 10/01/2010  . Pneumonia   . Pulmonary embolism ([place]) 02/2010  . Recurrent upper respiratory infection (URI)   . S/P endoscopy September 28, 2010   erosive reflux esophagitis, Billroth I anatomy  . 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 Seizures ([place])   . Shortness of breath   . TIA (transient ischemic attack) 10/11  . Vitamin B12 deficiency     Patient Active Problem List   Diagnosis Date Noted  . Seizure disorder ([place]) 09/16/2016  . CKD (chronic kidney disease) stage 3, GFR 30-59 ml/min (HCC) 06/14/2016  . Patient has nasogastric tube   .  Ileus ([place]) 05/15/2016  . Chronic diastolic heart failure ([place]) 05/15/2016  . Iron deficiency anemia 03/23/2016  . Acute renal failure ([place]) 03/19/2016  . Normocytic anemia 03/19/2016  . Hypotension 03/19/2016  . Anemia   . History of colon cancer, stage III   . Hx of colon cancer, stage III   . History of colonic polyps   . Barrett's esophagus without dysplasia   . Dysphagia   . Essential hypertension 11/30/2014  . Dysphagia, pharyngoesophageal phase   . Mucosal abnormality of esophagus   . SBO (small bowel obstruction) ([place]) 09/17/2013  . Rectal bleeding 07/11/2012  . Bowel obstruction ([place]) 07/11/2012  . Abnormal CT scan, colon 07/11/2012  . Esophageal dysphagia 02/22/2012  . H/O Clostridium difficile infection 02/22/2012  . Diarrhea 09/10/2011  . Cellulitis 09/10/2011  . Nausea 06/07/2011  . Abdominal distention 06/07/2011  . HTN (hypertension) 06/07/2011  . Partial small bowel obstruction ([place]) 05/31/2011  . GI bleed 05/27/2011  . Coagulopathy ([place]) 05/27/2011  . Gastroenteritis 05/27/2011  . Small bowel obstruction ([place]) 05/13/2011  . LUQ pain 05/13/2011  . Pulmonary embolism ([place]) 01/17/2011  . Chest pain at rest 01/17/2011  . Pneumonia 12/11/2010  . Hematemesis 12/05/2010  . Chronic abdominal pain 12/05/2010  . Pancytopenia due to antineoplastic chemotherapy (  [place]) 12/05/2010  . Coffee ground emesis 11/29/2010  . Esophageal reflux disease 11/15/2010  . 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 Personal history of PE (pulmonary embolism) 10/01/2010  . Bronchitis, acute 10/01/2010  . Erosive esophagitis 10/01/2010  . Fever chills 09/29/2010  . Bleeding gastrointestinal 09/26/2010  . Supratherapeutic INR 09/26/2010  . Abdominal pain 09/26/2010  . Nausea and vomiting 09/26/2010  . Anemia, chronic disease 09/26/2010  . Adenocarcinoma of colon with mucinous features 09/22/2010    Past Surgical History:  Procedure Laterality Date  . ABDOMINAL ADHESION SURGERY   03/04/15   @ [facility]  . ABDOMINAL EXPLORATION SURGERY    . abdominal sugery     for bowel obstruction x 8, all in 1980s, except for one in 07/2010  . APPENDECTOMY  1980s  . Billroth 1 hemigastrectomy  1980s   per patient for benign duodenal tumor  . CARDIAC CATHETERIZATION  07/17/2012  . CHOLECYSTECTOMY  1980s  . COLON SURGERY  May 2012   left hemicolectomy, colon cancer found at time of surgery for bowel obstruction  . COLONOSCOPY  03/18/2011   anastomosis at 35cm. Several adenomatous polyps removed. Sigmoid diverticulosis. Next TCS 02/2013  . COLONOSCOPY N/A 07/24/2012   [id number]:[id number] post segmental resection with normal-appearing colonic anastomosis aside from an adjacent polyp-removed as described above. Rectal polyp-removed as described above. CT findings appear to have been artifactual. tubular adenomas/prolapsed type polyp.  . COLONOSCOPY N/A 05/15/2015   Procedure: COLONOSCOPY;  Surgeon: [name], MD;  Location: [facility];  Service: Endoscopy;  Laterality: N/A;  . COLONOSCOPY WITH PROPOFOL N/A 11/04/2016   Dr. [name], status post subtotal colectomy with normal-appearing residual lower GI tract.  Next colonoscopy in 5 years.  . ESOPHAGOGASTRODUODENOSCOPY  09/28/2010  . ESOPHAGOGASTRODUODENOSCOPY  12/01/2010   Cervical web status post dilation, erosive esophagitis, B1 hemigastrectomy, inflamed anastomosis  . ESOPHAGOGASTRODUODENOSCOPY  04/16/2011   excoriation at GEJ c/w trauma/M-W tear, friable gastric anastomosis, dilation efferent limb  . ESOPHAGOGASTRODUODENOSCOPY N/A 06/03/2014   Dr.[name]- cervcal esopphageal web s/p dilation. abnormal distal esophagus bx= barretts esophagus  . ESOPHAGOGASTRODUODENOSCOPY N/A 05/15/2015   Procedure: ESOPHAGOGASTRODUODENOSCOPY (EGD);  Surgeon: [name], MD;  Location: [facility];  Service: Endoscopy;  Laterality: N/A;  230  . ESOPHAGOGASTRODUODENOSCOPY (EGD) WITH ESOPHAGEAL DILATION  02/25/2012   TSV:[id number] esophageal web-s/p dilation  anddisruption as described above. Status post prior gastric with Billroth I configuration. Abnormal gastric mucosa at the anastomosis. Gastric biopsy showed mild chronic inflammation but no H. pylori   . ESOPHAGOGASTRODUODENOSCOPY (EGD) WITH PROPOFOL N/A 11/04/2016   Dr. [name]: Reflux esophagitis, small hiatal hernia status post hemigastrectomy.  Single patent efferent small bowel limb appeared normal, 2 x 2 centimeter tongue of salmon epithelium again seen, esophageal dilation.  Biopsies consistent with reflux changes, not Barrett's however this was confirmed on prior EGDs.  Offer 3-year follow-up EGD August 2021.  [name] HERNIA REPAIR     right inguinal  . MALONEY DILATION N/A 06/03/2014   Procedure: [name] DILATION;  Surgeon: [name], MD;  Location: [facility];  Service: Endoscopy;  Laterality: N/A;  [name] DILATION N/A 05/15/2015   Procedure: [name] DILATION;  Surgeon: [name], MD;  Location: [facility];  Service: Endoscopy;  Laterality: N/A;  . [name] DILATION N/A 11/04/2016   Procedure: [name] DILATION;  Surgeon: [name], MD;  Location: [facility];  Service: Endoscopy;  Laterality: N/A;  . PORTACATH PLACEMENT    . SAVORY DILATION N/A 06/03/2014   Procedure:  SAVORY DILATION;  Surgeon: [name], MD;  Location: [facility];  Service: Endoscopy;  Laterality: N/A;        Home Medications    Prior to Admission medications   Medication Sig Start Date End Date Taking? Authorizing Provider  atorvastatin (LIPITOR) 20 MG tablet Take 20 mg by mouth at bedtime.     [provider]  carvedilol (COREG) 25 MG tablet Take 25 mg by mouth 2 (two) times daily. as directed 08/16/17   [provider]  Cholecalciferol (VITAMIN D) 2000 units CAPS Take 2,000 Units by mouth daily.    [provider]  docusate sodium (COLACE) 100 MG capsule Take 200 mg by mouth daily as needed for mild constipation.     [provider]  enoxaparin (LOVENOX)  60 MG/0.6ML injection Inject into the skin.    [provider]  lansoprazole (PREVACID) 30 MG capsule TAKE 1 CAPSULE BY MOUTH TWICE DAILY BEFORE  A  MEAL 09/05/17   Annitta Needs, NP  lidocaine-prilocaine (EMLA) cream Apply 1 application topically as needed (prior to accessing port). 05/16/17   Holley Bouche, NP  LORazepam (ATIVAN) 1 MG tablet Take 1 tablet (1 mg total) by mouth every 8 (eight) hours as needed for anxiety. Patient taking differently: Take 1 mg by mouth daily.  05/23/16   Elgergawy, Silver Huguenin, MD  mirtazapine (REMERON) 30 MG tablet Take 1 tablet (30 mg total) by mouth at bedtime. Take 1/2 pill for first 7 days. Then take 1 whole pill daily thereafter. 10/25/17   Derek Jack, MD  Multiple Vitamin (MULTIVITAMIN WITH MINERALS) TABS tablet Take 1 tablet by mouth daily.    [provider]  Nutritional Supplements (ENSURE PLUS HN) LIQD Take 1 Bottle daily by mouth.     [provider]  ondansetron (ZOFRAN ODT) 4 MG disintegrating tablet Take 1 tablet (4 mg total) by mouth every 8 (eight) hours as needed for nausea or vomiting. 12/21/16   Long, Wonda Olds, MD  polyethylene glycol (MIRALAX / GLYCOLAX) packet Take 17 g by mouth daily. Patient taking differently: Take 17 g by mouth daily as needed for mild constipation.  09/19/16   Kathie Dike, MD  Probiotic Product (PROBIOTIC PO) Take 1 tablet by mouth daily.    [provider]  prochlorperazine (COMPAZINE) 10 MG tablet Take 10 mg by mouth every 6 (six) hours as needed for nausea or vomiting.     [provider]  traMADol (ULTRAM) 50 MG tablet Take 1 tablet (50 mg total) by mouth every 6 (six) hours as needed. 12/21/16   Long, Wonda Olds, MD  Vitamin D, Ergocalciferol, (DRISDOL) 50000 units CAPS capsule Take 50,000 Units by mouth every 30 (thirty) days.     [provider]  zoledronic acid (RECLAST) 5 MG/100ML SOLN injection Inject 5 mg into the vein See admin instructions. Pt gets a dose  yearly. Due again 2018    [provider]    Family History Family History  Problem Relation Age of Onset  . Hypertension Mother   . Arthritis Mother   . Pneumonia Mother   . Hypertension Father   . Heart attack Father   . Colon cancer Neg Hx     Social History Social History   Tobacco Use  . Smoking status: Former Smoker    Packs/day: 0.50    Years: 40.00    Pack years: 20.00    Types: Cigarettes    Last attempt to quit: 12/20/2012  Years since quitting: 4.9  . Smokeless tobacco: Never Used  Substance Use Topics  . Alcohol use: No  . Drug use: No     Allergies   Patient has no known allergies.   Review of Systems Review of Systems  All systems reviewed and negative, other than as noted in HPI.  Physical Exam Updated Vital Signs BP (!) 165/103 (BP Location: Right Arm)   Pulse 89   Temp 98.2 F (36.8 C) (Oral)   Resp 20   Ht 5\' 9"  (1.753 m)   Wt 68 kg   SpO2 99%   BMI 22.15 kg/m   Physical Exam  Constitutional: He appears well-developed and well-nourished. No distress.  HENT:  Head: Normocephalic and atraumatic.  Eyes: Conjunctivae are normal. Right eye exhibits no discharge. Left eye exhibits no discharge.  Neck: Neck supple.  Cardiovascular: Normal rate, regular rhythm and normal heart sounds. Exam reveals no gallop and no friction rub.  No murmur heard. Pulmonary/Chest: Effort normal and breath sounds normal. No respiratory distress.  Abdominal: Soft. He exhibits no distension. There is no tenderness.  Musculoskeletal: He exhibits no edema or tenderness.  Lower extremities symmetric as compared to each other. No calf tenderness. Negative Homan's. No palpable cords.   Neurological: He is alert.  Skin: Skin is warm and dry.  Psychiatric: He has a normal mood and affect. His behavior is normal. Thought content normal.  Nursing note and vitals reviewed.    ED Treatments / Results  Labs (all labs ordered are listed, but only abnormal  results are displayed) Labs Reviewed  BASIC METABOLIC PANEL - Abnormal; Notable for the following components:      Result Value   Chloride 112 (*)    CO2 20 (*)    Glucose, Bld 105 (*)    Calcium 8.8 (*)    All other components within normal limits  CBC  TROPONIN I  PROTIME-INR  TROPONIN I    EKG EKG Interpretation  Date/Time:  Friday November 25 2017 13:25:04 EDT Ventricular Rate:  86 PR Interval:    QRS Duration: 89 QT Interval:  350 QTC Calculation: 419 R Axis:   -14 Text Interpretation:  Sinus rhythm Posterior infarct, old Non-specific ST-t changes Confirmed by Virgel Manifold [phone]) on 11/25/2017 1:37:21 PM   Radiology No results found.  Procedures Procedures (including critical care time)  Medications Ordered in ED Medications - No data to display   Initial Impression / Assessment and Plan / ED Course  I have reviewed the triage vital signs and the nursing notes.  Pertinent labs & imaging results that were available during my care of the patient were reviewed by me and considered in my medical decision making (see chart for details).    67 year old male with chest pain and dyspnea.  Symptoms somewhat concerning although constant pain for about 4 hours at this point is somewhat atypical.  His EKG is abnormal but changes noted to looking back over prior several EKGs.  Generally appears well.  Aside from little bit of hypertension his vitals are otherwise reassuring.  His exam is nonfocal.  He does not appear to be volume overloaded clinically.  Will obtain labs including a troponin.  Chest x-ray.   Repeat troponin remains normal.  Low suspicion for emergent process.  Return precautions discussed.  Outpatient follow-up otherwise.  Final Clinical Impressions(s) / ED Diagnoses   Final diagnoses:  Nonspecific chest pain    ED Discharge Orders    None  Virgel Manifold, MD 11/28/17 [phone]

## 2018-01-03 ENCOUNTER — Ambulatory Visit (INDEPENDENT_AMBULATORY_CARE_PROVIDER_SITE_OTHER): Payer: Medicare Other | Admitting: Internal Medicine

## 2018-01-03 ENCOUNTER — Other Ambulatory Visit: Payer: Self-pay

## 2018-01-03 ENCOUNTER — Encounter: Payer: Self-pay | Admitting: Internal Medicine

## 2018-01-03 VITALS — BP 144/96 | HR 69 | Temp 97.0°F | Ht 69.0 in | Wt 153.8 lb

## 2018-01-03 DIAGNOSIS — R197 Diarrhea, unspecified: Secondary | ICD-10-CM

## 2018-01-03 DIAGNOSIS — R634 Abnormal weight loss: Secondary | ICD-10-CM | POA: Diagnosis not present

## 2018-01-03 NOTE — Progress Notes (Signed)
Primary Care Physician:  [name], MD Primary Gastroenterologist:  Dr. [name]  Pre-Procedure History & Physical: HPI:  [name] is a 67 y.o. male here for further evaluation of what is now chronic diarrhea.  Had had several months of nonbloody watery diarrhea 2-3 times daily.  No nocturnal diarrhea.  Diarrhea lessens with fasting.  No bleeding.  Stool for C. difficile and GI pathogen panel came back negative.  No change in medications recently.  May have had a bout of gastroenteritis preceding his diarrhea.  He has not lost any further weight -  stable at 153. He does note that his stools are sometimes greasy and malodorous and difficult to flush.  Celiac screen negative.  Interestingly, he has a history of osteoporosis and B12 deficiency..  He status post cholecystectomy,   Hemigastrectomy in the [employer] for a benign tumor, small bowel resection and subtotal colectomy for colon cancer.  Recent EGD and colonoscopy were good.  No indications of upper GI tract or lower GI tract pathology as far as inflammatory bowel disease, etc. Is concerned.  He takes Imodium occasionally before meals.  Has not tried taking it as a scheduled agent each morning.  Of note, he does not have nocturnal diarrhea.  He has not had a fever or chills.  Occasional early satiety but generally his appetite is well-maintained.  Past Medical History:  Diagnosis Date  . 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 Anemia   . Anxiety   . Arthritis   . Barrett's esophagus   . Blood transfusion   . Bowel obstruction ([place]) 05/13/2012   Recurrent  . Bronchitis   . Chest pain at rest   . Chronic abdominal pain   . Erosive esophagitis   . ETOH abuse    quit 03/2010  . GERD (gastroesophageal reflux disease)   . Hx of Clostridium difficile infection 01/2012  . Hypertension   . Ileus ([place])   . Iron deficiency anemia 03/23/2016  . Obstruction of bowel ([place]) 03/03/14  . Osteoporosis   .  Personal history of PE (pulmonary embolism) 10/01/2010  . Pneumonia   . Pulmonary embolism ([place]) 02/2010  . Recurrent upper respiratory infection (URI)   . S/P endoscopy September 28, 2010   erosive reflux esophagitis, Billroth I anatomy  . 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 Seizures ([place])   . Shortness of breath   . TIA (transient ischemic attack) 10/11  . Vitamin B12 deficiency     Past Surgical History:  Procedure Laterality Date  . ABDOMINAL ADHESION SURGERY  03/04/15   @ [facility]  . ABDOMINAL EXPLORATION SURGERY    . abdominal sugery     for bowel obstruction x 8, all in 1980s, except for one in 07/2010  . APPENDECTOMY  1980s  . Billroth 1 hemigastrectomy  1980s   per patient for benign duodenal tumor  . CARDIAC CATHETERIZATION  07/17/2012  . CHOLECYSTECTOMY  1980s  . COLON SURGERY  May 2012   left hemicolectomy, colon cancer found at time of surgery for bowel obstruction  . COLONOSCOPY  03/18/2011   anastomosis at 35cm. Several adenomatous polyps removed. Sigmoid diverticulosis. Next TCS 02/2013  . COLONOSCOPY N/A 07/24/2012   KXF:GHWEXH post segmental resection with normal-appearing colonic anastomosis aside from an adjacent polyp-removed as described above. Rectal polyp-removed as described above. CT findings appear to have been artifactual. tubular adenomas/prolapsed type polyp.  . COLONOSCOPY N/A 05/15/2015   Procedure: COLONOSCOPY;  Surgeon: [name], MD;  [facility];  Service: Endoscopy;  Laterality: N/A;  . COLONOSCOPY WITH PROPOFOL N/A 11/04/2016   Dr. [name], status post subtotal colectomy with normal-appearing residual lower GI tract.  Next colonoscopy in 5 years.  . ESOPHAGOGASTRODUODENOSCOPY  09/28/2010  . ESOPHAGOGASTRODUODENOSCOPY  12/01/2010   Cervical web status post dilation, erosive esophagitis, B1 hemigastrectomy, inflamed anastomosis  . ESOPHAGOGASTRODUODENOSCOPY  04/16/2011   excoriation at GEJ c/w trauma/M-W tear, friable gastric anastomosis, dilation  efferent limb  . ESOPHAGOGASTRODUODENOSCOPY N/A 06/03/2014   Dr.Dusten Ellinwood- cervcal esopphageal web s/p dilation. abnormal distal esophagus bx= barretts esophagus  . ESOPHAGOGASTRODUODENOSCOPY N/A 05/15/2015   Procedure: ESOPHAGOGASTRODUODENOSCOPY (EGD);  Surgeon: [name], MD;  [facility];  Service: Endoscopy;  Laterality: N/A;  230  . ESOPHAGOGASTRODUODENOSCOPY (EGD) WITH ESOPHAGEAL DILATION  02/25/2012   EQA:STMHDQQI esophageal web-s/p dilation anddisruption as described above. Status post prior gastric with Billroth I configuration. Abnormal gastric mucosa at the anastomosis. Gastric biopsy showed mild chronic inflammation but no H. pylori   . ESOPHAGOGASTRODUODENOSCOPY (EGD) WITH PROPOFOL N/A 11/04/2016   Dr. [name]: Reflux esophagitis, small hiatal hernia status post hemigastrectomy.  Single patent efferent small bowel limb appeared normal, 2 x 2 centimeter tongue of salmon epithelium again seen, esophageal dilation.  Biopsies consistent with reflux changes, not Barrett's however this was confirmed on prior EGDs.  Offer 3-year follow-up EGD August 2021.  [name] HERNIA REPAIR     right inguinal  . MALONEY DILATION N/A 06/03/2014   Procedure: [name] DILATION;  Surgeon: [name], MD;  [facility];  Service: Endoscopy;  Laterality: N/A;  [name] DILATION N/A 05/15/2015   Procedure: [name] DILATION;  Surgeon: [name], MD;  [facility];  Service: Endoscopy;  Laterality: N/A;  . [name] DILATION N/A 11/04/2016   Procedure: [name] DILATION;  Surgeon: [name], MD;  [facility];  Service: Endoscopy;  Laterality: N/A;  . PORTACATH PLACEMENT    . SAVORY DILATION N/A 06/03/2014   Procedure: SAVORY DILATION;  Surgeon: [name], MD;  [facility];  Service: Endoscopy;  Laterality: N/A;    Prior to Admission medications   Medication Sig Start Date End Date Taking? Authorizing Provider  atorvastatin (LIPITOR) 20 MG tablet  Take 20 mg by mouth at bedtime.    Yes [provider]  carvedilol (COREG) 25 MG tablet Take 25 mg by mouth 2 (two) times daily. as directed 08/16/17  Yes [provider]  Cholecalciferol (VITAMIN D) 2000 units CAPS Take 2,000 Units by mouth daily.   Yes [provider]  enoxaparin (LOVENOX) 60 MG/0.6ML injection Inject into the skin daily.    Yes [provider]  lansoprazole (PREVACID) 30 MG capsule TAKE 1 CAPSULE BY MOUTH TWICE DAILY BEFORE  A  MEAL 09/05/17  Yes Annitta Needs, NP  lidocaine-prilocaine (EMLA) cream Apply 1 application topically as needed (prior to accessing port). 05/16/17  Yes Holley Bouche, NP  LORazepam (ATIVAN) 1 MG tablet Take 1 tablet (1 mg total) by mouth every 8 (eight) hours as needed for anxiety. Patient taking differently: Take 1 mg by mouth daily.  05/23/16  Yes Elgergawy, Silver Huguenin, MD  mirtazapine (REMERON) 30 MG tablet Take 1 tablet (30 mg total) by mouth at bedtime. Take 1/2 pill for first 7 days. Then take 1 whole pill daily thereafter. 10/25/17  Yes Derek Jack, MD  Multiple Vitamin (MULTIVITAMIN WITH MINERALS) TABS tablet Take 1 tablet  by mouth daily.   Yes [provider]  Nutritional Supplements (ENSURE PLUS HN) LIQD Take 1 Bottle daily by mouth.    Yes [provider]  ondansetron (ZOFRAN ODT) 4 MG disintegrating tablet Take 1 tablet (4 mg total) by mouth every 8 (eight) hours as needed for nausea or vomiting. 12/21/16  Yes Long, Wonda Olds, MD  polyethylene glycol (MIRALAX / GLYCOLAX) packet Take 17 g by mouth daily. Patient taking differently: Take 17 g by mouth daily as needed for mild constipation.  09/19/16  Yes Kathie Dike, MD  Probiotic Product (PROBIOTIC PO) Take 1 tablet by mouth daily.   Yes [provider]  prochlorperazine (COMPAZINE) 10 MG tablet Take 10 mg by mouth every 6 (six) hours as needed for nausea or vomiting.    Yes [provider]  traMADol (ULTRAM) 50 MG  tablet Take 1 tablet (50 mg total) by mouth every 6 (six) hours as needed. 12/21/16  Yes Long, Wonda Olds, MD  Vitamin D, Ergocalciferol, (DRISDOL) 50000 units CAPS capsule Take 50,000 Units by mouth every 7 (seven) days.    Yes [provider]  zoledronic acid (RECLAST) 5 MG/100ML SOLN injection Inject 5 mg into the vein See admin instructions. Pt gets a dose yearly. Due again 2018   Yes [provider]  docusate sodium (COLACE) 100 MG capsule Take 200 mg by mouth daily as needed for mild constipation.     [provider]    Allergies as of 01/03/2018  . (No Known Allergies)    Family History  Problem Relation Age of Onset  . Hypertension Mother   . Arthritis Mother   . Pneumonia Mother   . Hypertension Father   . Heart attack Father   . Colon cancer Neg Hx     Social History   Socioeconomic History  . Marital status: Married    Spouse name: Not on file  . Number of children: 3  . Years of education: Not on file  . Highest education level: Not on file  Occupational History    Employer: [employer]  Social Needs  . Financial resource strain: Not on file  . Food insecurity:    Worry: Not on file    Inability: Not on file  . Transportation needs:    Medical: Not on file    Non-medical: Not on file  Tobacco Use  . Smoking status: Former Smoker    Packs/day: 0.50    Years: 40.00    Pack years: 20.00    Types: Cigarettes    Last attempt to quit: 12/20/2012    Years since quitting: 5.0  . Smokeless tobacco: Never Used  Substance and Sexual Activity  . Alcohol use: No  . Drug use: No  . Sexual activity: Never  Lifestyle  . Physical activity:    Days per week: Not on file    Minutes per session: Not on file  . Stress: Not on file  Relationships  . Social connections:    Talks on phone: Not on file    Gets together: Not on file    Attends religious service: Not on file    Active member of club or organization: Not on file    Attends  meetings of clubs or organizations: Not on file    Relationship status: Not on file  . Intimate partner violence:    Fear of current or ex partner: Not on file    Emotionally abused: Not on file  Physically abused: Not on file    Forced sexual activity: Not on file  Other Topics Concern  . Not on file  Social History Narrative  . Not on file    Review of Systems: See HPI, otherwise negative ROS  Physical Exam: BP (!) 144/96   Pulse 69   Temp (!) 97 F (36.1 C) (Oral)   Ht 5\' 9"  (1.753 m)   Wt 153 lb 12.8 oz (69.8 kg)   BMI 22.71 kg/m  General:   Alert,  pleasant and cooperative in NAD Skin:  Intact without significant lesions or rashes. Neck:  Supple; no masses or thyromegaly. No significant cervical adenopathy. Lungs:  Clear throughout to auscultation.   No wheezes, crackles, or rhonchi. No acute distress. Heart:  Regular rate and rhythm; no murmurs, clicks, rubs,  or gallops. Abdomen: Non-distended, normal bowel sounds.  Soft and nontender without appreciable mass or hepatosplenomegaly.  Pulses:  Normal pulses noted. Extremities:  Without clubbing or edema.  Impression/Plan: 67 year old gentleman with chronic diarrhea.  Stool studies and celiac screen negative.  He has had some weight loss which is stabilized. B12 deficiency and osteoporosis also interesting comorbidities.  His gut has been foreshortened given his gastric and small bowel surgeries along with a subtotal colectomy.  His gallbladder is out as well.  So a foreshortened gut and cholecystectomy state may predispose him to loose stools.  However, he was managing fairly well until recently.  At this point, need to consider the possibility of pancreatic exocrine insufficiency with other coexisting, predisposing factors potentially contributing to diarrhea.  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  Resin binder therapy may also be worth a try depending on response to pancreatic enzyme supplementation  Further recommendations to follow      Notice: This dictation was prepared with Dragon dictation along with smaller phrase technology. Any transcriptional errors that result from this process are unintentional and may not be corrected upon review.

## 2018-01-03 NOTE — Patient Instructions (Signed)
Stool sample for fecal elastase  Try taking an Imodium each morning when you wake   Further recommendations to follow

## 2018-01-11 LAB — PANCREATIC ELASTASE, FECAL: PANCREATIC ELASTASE-1, STL: 28 ug/g — AB

## 2018-01-18 ENCOUNTER — Other Ambulatory Visit: Payer: Self-pay

## 2018-01-18 MED ORDER — PANCRELIPASE (LIP-PROT-AMYL) 24000-76000 UNITS PO CPEP: ORAL_CAPSULE | ORAL | 11 refills | Status: DC

## 2018-02-07 ENCOUNTER — Encounter (HOSPITAL_COMMUNITY): Payer: Self-pay

## 2018-02-07 ENCOUNTER — Inpatient Hospital Stay (HOSPITAL_COMMUNITY): Payer: Medicare Other | Attending: Hematology

## 2018-02-07 ENCOUNTER — Other Ambulatory Visit (HOSPITAL_COMMUNITY): Payer: Self-pay | Admitting: Hematology

## 2018-02-07 DIAGNOSIS — D509 Iron deficiency anemia, unspecified: Secondary | ICD-10-CM

## 2018-02-07 DIAGNOSIS — R630 Anorexia: Secondary | ICD-10-CM

## 2018-02-07 DIAGNOSIS — Z85038 Personal history of other malignant neoplasm of large intestine: Secondary | ICD-10-CM | POA: Diagnosis present

## 2018-02-07 DIAGNOSIS — C189 Malignant neoplasm of colon, unspecified: Secondary | ICD-10-CM

## 2018-02-07 LAB — CBC WITH DIFFERENTIAL/PLATELET
Abs Immature Granulocytes: 0.05 10*3/uL (ref 0.00–0.07)
BASOS PCT: 0 %
Basophils Absolute: 0 10*3/uL (ref 0.0–0.1)
EOS ABS: 0.2 10*3/uL (ref 0.0–0.5)
Eosinophils Relative: 2 %
HCT: 42.9 % (ref 39.0–52.0)
Hemoglobin: 13.9 g/dL (ref 13.0–17.0)
IMMATURE GRANULOCYTES: 1 %
Lymphocytes Relative: 22 %
Lymphs Abs: 2.1 10*3/uL (ref 0.7–4.0)
MCH: 31 pg (ref 26.0–34.0)
MCHC: 32.4 g/dL (ref 30.0–36.0)
MCV: 95.8 fL (ref 80.0–100.0)
MONOS PCT: 8 %
Monocytes Absolute: 0.8 10*3/uL (ref 0.1–1.0)
NRBC: 0 % (ref 0.0–0.2)
Neutro Abs: 6.3 10*3/uL (ref 1.7–7.7)
Neutrophils Relative %: 67 %
PLATELETS: 158 10*3/uL (ref 150–400)
RBC: 4.48 MIL/uL (ref 4.22–5.81)
RDW: 13.2 % (ref 11.5–15.5)
WBC: 9.4 10*3/uL (ref 4.0–10.5)

## 2018-02-07 LAB — COMPREHENSIVE METABOLIC PANEL
ALT: 53 U/L — ABNORMAL HIGH (ref 0–44)
AST: 35 U/L (ref 15–41)
Albumin: 3.5 g/dL (ref 3.5–5.0)
Alkaline Phosphatase: 78 U/L (ref 38–126)
Anion gap: 8 (ref 5–15)
BUN: 8 mg/dL (ref 8–23)
CHLORIDE: 112 mmol/L — AB (ref 98–111)
CO2: 20 mmol/L — AB (ref 22–32)
Calcium: 8.2 mg/dL — ABNORMAL LOW (ref 8.9–10.3)
Creatinine, Ser: 1.26 mg/dL — ABNORMAL HIGH (ref 0.61–1.24)
GFR calc non Af Amer: 57 mL/min — ABNORMAL LOW (ref >60)
Glucose, Bld: 113 mg/dL — ABNORMAL HIGH (ref 70–99)
POTASSIUM: 3.7 mmol/L (ref 3.5–5.1)
SODIUM: 140 mmol/L (ref 135–145)
Total Bilirubin: 0.5 mg/dL (ref 0.3–1.2)
Total Protein: 6.3 g/dL — ABNORMAL LOW (ref 6.5–8.1)

## 2018-02-07 LAB — FOLATE: FOLATE: 41.7 ng/mL (ref >5.9)

## 2018-02-07 LAB — VITAMIN B12: VITAMIN B 12: 335 pg/mL (ref 180–914)

## 2018-02-07 LAB — IRON AND TIBC
IRON: 134 ug/dL (ref 45–182)
SATURATION RATIOS: 43 % — AB (ref 17.9–39.5)
TIBC: 314 ug/dL (ref 250–450)
UIBC: 180 ug/dL

## 2018-02-07 LAB — FERRITIN: FERRITIN: 145 ng/mL (ref 24–336)

## 2018-02-07 MED ORDER — HEPARIN SOD (PORK) LOCK FLUSH 100 UNIT/ML IV SOLN
500.0000 [IU] | Freq: Once | INTRAVENOUS | Status: AC
  Administered 2018-02-07: 500 [IU] via INTRAVENOUS
  Filled 2018-02-07: qty 5

## 2018-02-07 MED ORDER — SODIUM CHLORIDE 0.9% FLUSH
10.0000 mL | Freq: Once | INTRAVENOUS | Status: AC
  Administered 2018-02-07: 10 mL

## 2018-02-07 NOTE — Patient Instructions (Signed)
[place] at [facility]  Discharge Instructions: Port Flush with lab draw.  _______________________________________________________________  Thank you for choosing [place] at [facility] to provide your oncology and hematology care.  To afford each patient quality time with our providers, please arrive at least 15 minutes before your scheduled appointment.  You need to re-schedule your appointment if you arrive 10 or more minutes late.  We strive to give you quality time with our providers, and arriving late affects you and other patients whose appointments are after yours.  Also, if you no show three or more times for appointments you may be dismissed from the clinic.  Again, thank you for choosing [place] at [place] hope is that these requests will allow you access to exceptional care and in a timely manner. _______________________________________________________________  If you have questions after your visit, please contact our office at [phone] between the hours of 8:30 a.m. and 5:00 p.m. Voicemails left after 4:30 p.m. will not be returned until the following business day. _______________________________________________________________  For prescription refill requests, have your pharmacy contact our office. _______________________________________________________________  Recommendations made by the consultant and any test results will be sent to your referring physician. _______________________________________________________________

## 2018-02-07 NOTE — Progress Notes (Signed)
Pt presents today for port flush and lab work. VSS. No complaints today. MAR reviewed.

## 2018-02-08 LAB — CEA: CEA: 3.6 ng/mL (ref 0.0–4.7)

## 2018-02-13 ENCOUNTER — Other Ambulatory Visit (HOSPITAL_COMMUNITY): Payer: Self-pay | Admitting: *Deleted

## 2018-02-13 DIAGNOSIS — R630 Anorexia: Secondary | ICD-10-CM

## 2018-02-13 DIAGNOSIS — C189 Malignant neoplasm of colon, unspecified: Secondary | ICD-10-CM

## 2018-02-13 MED ORDER — MIRTAZAPINE 30 MG PO TABS: 30.0000 mg | ORAL_TABLET | Freq: Every day | ORAL | 1 refills | Status: DC

## 2018-02-21 ENCOUNTER — Encounter (HOSPITAL_COMMUNITY): Payer: Self-pay | Admitting: Hematology

## 2018-02-21 ENCOUNTER — Inpatient Hospital Stay (HOSPITAL_COMMUNITY): Payer: Medicare Other | Attending: Hematology | Admitting: Hematology

## 2018-02-21 ENCOUNTER — Other Ambulatory Visit: Payer: Self-pay

## 2018-02-21 ENCOUNTER — Ambulatory Visit (HOSPITAL_COMMUNITY): Payer: Medicare Other | Admitting: Hematology

## 2018-02-21 VITALS — BP 151/78 | HR 71 | Resp 16 | Wt 156.6 lb

## 2018-02-21 DIAGNOSIS — G629 Polyneuropathy, unspecified: Secondary | ICD-10-CM

## 2018-02-21 DIAGNOSIS — Z79899 Other long term (current) drug therapy: Secondary | ICD-10-CM | POA: Diagnosis not present

## 2018-02-21 DIAGNOSIS — Z9221 Personal history of antineoplastic chemotherapy: Secondary | ICD-10-CM

## 2018-02-21 DIAGNOSIS — Z87891 Personal history of nicotine dependence: Secondary | ICD-10-CM | POA: Diagnosis not present

## 2018-02-21 DIAGNOSIS — D509 Iron deficiency anemia, unspecified: Secondary | ICD-10-CM

## 2018-02-21 DIAGNOSIS — I10 Essential (primary) hypertension: Secondary | ICD-10-CM | POA: Diagnosis not present

## 2018-02-21 DIAGNOSIS — C189 Malignant neoplasm of colon, unspecified: Secondary | ICD-10-CM

## 2018-02-21 DIAGNOSIS — Z85038 Personal history of other malignant neoplasm of large intestine: Secondary | ICD-10-CM

## 2018-02-21 MED ORDER — GABAPENTIN 100 MG PO CAPS: 100.0000 mg | ORAL_CAPSULE | Freq: Every day | ORAL | 1 refills | Status: DC

## 2018-02-21 NOTE — Assessment & Plan Note (Signed)
1.  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 -Denies any bleeding per rectum or melena.  Last colonoscopy/EGD was on 05/15/2015 which was normal. -CT scan in October 2018 was negative for metastatic disease. - Physical examination today is within normal limits.  CEA was also within normal limits.  Does not have any clinical evidence of recurrence or metastatic disease at this time.  ALT is minimally elevated since the start of pancrelipase.  We will closely monitor it.  2.  Iron deficiency: - I have given him Feraheme on 09/05/2017 and 09/12/2017. -She felt better after the Feraheme infusion.  His ferritin improved to 145.  Percent saturation was 43.  Hemoglobin was 13.9.  He does not require any parenteral iron therapy at this time.  3.  Peripheral neuropathy: -He has neuropathy with tingling and numbness in the feet.  He also feels pins-and-needles sensation in the feet which keeps him awake at times. -I have will start him on gabapentin 100 mg at bedtime and titrate up as needed.

## 2018-02-21 NOTE — Progress Notes (Signed)
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 male returns for routine follow-up for stage III adenocarcinoma of the left colon. He is here today and doing well .He does have mild numbness and tingling in his hands and feet it is stable at this time. He states it is much worse at nighttime. He does have occasional diarrhea and nausea. He denies any vomiting. Denies any fevers or recent infections. Denies any bleeding. Denies any new pains. He reports his appetite and energy level at 50% and drinks ensure daily to help supplement his nutrition.    REVIEW OF SYSTEMS:  Review of Systems  Gastrointestinal: Positive for diarrhea and nausea.  Neurological: Positive for numbness.  All other systems reviewed and are negative.    PAST MEDICAL/SURGICAL HISTORY:  Past Medical History:  Diagnosis Date  . 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 Anemia   . Anxiety   . Arthritis   . Barrett's esophagus   . Blood transfusion   . Bowel obstruction ([facility]) 05/13/2012   Recurrent  . Bronchitis   . Chest pain at rest   . Chronic abdominal pain   . Erosive esophagitis   . ETOH abuse    quit 03/2010  . GERD (gastroesophageal reflux disease)   . Hx of Clostridium difficile infection 01/2012  . Hypertension   . Ileus ([place])   . Iron deficiency anemia 03/23/2016  . Obstruction of bowel ([place]) 03/03/14  . Osteoporosis   . Personal history of PE (pulmonary  embolism) 10/01/2010  . Pneumonia   . Pulmonary embolism ([place]) 02/2010  . Recurrent upper respiratory infection (URI)   . S/P endoscopy September 28, 2010   erosive reflux esophagitis, Billroth I anatomy  . 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 Seizures ([place])   . Shortness of breath   . TIA (transient ischemic attack) 10/11  . Vitamin B12 deficiency    Past Surgical History:  Procedure Laterality Date  . ABDOMINAL ADHESION SURGERY  03/04/15   @ [facility]  . ABDOMINAL EXPLORATION SURGERY    . abdominal sugery     for bowel obstruction x 8, all in 1980s, except for one in 07/2010  . APPENDECTOMY  1980s  . Billroth 1 hemigastrectomy  1980s   per patient for benign duodenal tumor  . CARDIAC CATHETERIZATION  07/17/2012  . CHOLECYSTECTOMY  1980s  . COLON SURGERY  May 2012   left hemicolectomy, colon cancer found at time of surgery for bowel obstruction  . COLONOSCOPY  03/18/2011   anastomosis at 35cm. Several adenomatous polyps removed. Sigmoid diverticulosis. Next TCS 02/2013  . COLONOSCOPY N/A 07/24/2012   GLO:VFIEPP post segmental resection with normal-appearing colonic anastomosis aside from an adjacent polyp-removed as described above. Rectal polyp-removed as described above. CT findings appear to have been artifactual. tubular adenomas/prolapsed type polyp.  . COLONOSCOPY N/A 05/15/2015   Procedure: COLONOSCOPY;  Surgeon: [name], MD;  [facility];  Service: Endoscopy;  Laterality: N/A;  . COLONOSCOPY WITH PROPOFOL N/A 11/04/2016   Dr. [name], status post subtotal colectomy with normal-appearing residual lower GI tract.  Next colonoscopy in 5 years.  . ESOPHAGOGASTRODUODENOSCOPY  09/28/2010  . ESOPHAGOGASTRODUODENOSCOPY  12/01/2010   Cervical web status post dilation, erosive esophagitis, B1 hemigastrectomy, inflamed anastomosis  . ESOPHAGOGASTRODUODENOSCOPY  04/16/2011   excoriation at GEJ c/w trauma/M-W tear, friable gastric anastomosis, dilation efferent limb  .  ESOPHAGOGASTRODUODENOSCOPY N/A 06/03/2014   Dr.[name]- cervcal esopphageal web s/p dilation. abnormal distal esophagus bx= barretts esophagus  . ESOPHAGOGASTRODUODENOSCOPY N/A 05/15/2015   Procedure: ESOPHAGOGASTRODUODENOSCOPY (EGD);  Surgeon: [name], MD;  [facility];  Service: Endoscopy;  Laterality: N/A;  230  . ESOPHAGOGASTRODUODENOSCOPY (EGD) WITH ESOPHAGEAL DILATION  02/25/2012   ACZ:YSAYTKZS esophageal web-s/p dilation anddisruption as described above. Status post prior gastric with Billroth I configuration. Abnormal gastric mucosa at the anastomosis. Gastric biopsy showed mild chronic inflammation but no H. pylori   . ESOPHAGOGASTRODUODENOSCOPY (EGD) WITH PROPOFOL N/A 11/04/2016   Dr. [name]: Reflux esophagitis, small hiatal hernia status post hemigastrectomy.  Single patent efferent small bowel limb appeared normal, 2 x 2 centimeter tongue of salmon epithelium again seen, esophageal dilation.  Biopsies consistent with reflux changes, not Barrett's however this was confirmed on prior EGDs.  Offer 3-year follow-up EGD August 2021.  [name] HERNIA REPAIR     right inguinal  . MALONEY DILATION N/A 06/03/2014   Procedure: [name] DILATION;  Surgeon: [name], MD;  [facility];  Service: Endoscopy;  Laterality: N/A;  [name] DILATION N/A 05/15/2015   Procedure: [name] DILATION;  Surgeon: [name], MD;  [facility];  Service: Endoscopy;  Laterality: N/A;  . [name] DILATION N/A 11/04/2016   Procedure: [name] DILATION;  Surgeon: [name], MD;  [facility];  Service: Endoscopy;  Laterality: N/A;  . PORTACATH PLACEMENT    . SAVORY DILATION N/A 06/03/2014   Procedure: SAVORY DILATION;  Surgeon: [name], MD;  [facility];  Service: Endoscopy;  Laterality: N/A;     SOCIAL HISTORY:  Social History   Socioeconomic History  . Marital status: Married    Spouse name: Not on file  . Number of children: 3  . Years of  education: Not on file  . Highest education level: Not on file  Occupational History    Employer: [employer]  Social Needs  . Financial resource strain: Not on file  . Food insecurity:    Worry: Not on file    Inability: Not on file  . Transportation needs:    Medical: Not on file    Non-medical: Not on file  Tobacco Use  . Smoking status: Former Smoker    Packs/day: 0.50    Years: 40.00    Pack years: 20.00    Types: Cigarettes    Last attempt to quit: 12/20/2012    Years since quitting: 5.1  . Smokeless tobacco: Never Used  Substance and Sexual Activity  . Alcohol use: No  . Drug use: No  . Sexual activity: Never  Lifestyle  . Physical activity:    Days per week: Not on file    Minutes per session: Not on file  . Stress: Not on file  Relationships  . Social connections:    Talks on phone: Not on file    Gets together: Not on file    Attends religious service: Not on file    Active member of  club or organization: Not on file    Attends meetings of clubs or organizations: Not on file    Relationship status: Not on file  . Intimate partner violence:    Fear of current or ex partner: Not on file    Emotionally abused: Not on file    Physically abused: Not on file    Forced sexual activity: Not on file  Other Topics Concern  . Not on file  Social History Narrative  . Not on file    FAMILY HISTORY:  Family History  Problem Relation Age of Onset  . Hypertension Mother   . Arthritis Mother   . Pneumonia Mother   . Hypertension Father   . Heart attack Father   . Colon cancer Neg Hx     CURRENT MEDICATIONS:  Outpatient Encounter Medications as of 02/21/2018  Medication Sig  . ammonium lactate (AMLACTIN) 12 % cream APPLY CREAM TO DRY SKIN ON LEGS ARMS HANDS AND BACK AREA ONCE TO TWICE DAILY (AVOID FACE GROIN UNDERARMS)  . atorvastatin (LIPITOR) 20 MG tablet Take 20 mg by mouth at bedtime.   . carvedilol (COREG) 25 MG tablet   . Cholecalciferol (VITAMIN D)  2000 units CAPS Take 2,000 Units by mouth daily.  . Cholecalciferol (VITAMIN D3 ULTRA POTENCY) 1.25 MG (50000 UT) TABS   . docusate sodium (COLACE) 100 MG capsule Take 200 mg by mouth daily as needed for mild constipation.   . enoxaparin (LOVENOX) 60 MG/0.6ML injection   . lansoprazole (PREVACID) 30 MG capsule TAKE 1 CAPSULE BY MOUTH TWICE DAILY BEFORE  A  MEAL  . lidocaine-prilocaine (EMLA) cream Apply 1 application topically as needed (prior to accessing port).  . LORazepam (ATIVAN) 1 MG tablet Take 1 tablet (1 mg total) by mouth every 8 (eight) hours as needed for anxiety. (Patient taking differently: Take 1 mg by mouth daily. )  . mirtazapine (REMERON) 30 MG tablet Take 1 tablet (30 mg total) by mouth at bedtime. Take 1/2 pill for first 7 days. Then take 1 whole pill daily thereafter.  . Multiple Vitamin (MULTIVITAMIN WITH MINERALS) TABS tablet Take 1 tablet by mouth daily.  . Nutritional Supplements (ENSURE PLUS HN) LIQD Take 1 Bottle daily by mouth.   . ondansetron (ZOFRAN ODT) 4 MG disintegrating tablet Take 1 tablet (4 mg total) by mouth every 8 (eight) hours as needed for nausea or vomiting.  . Pancrelipase, Lip-Prot-Amyl, 24000-76000 units CPEP Take 2 capsules with meals(breakfast, lunch, dinner) and one snack  . polyethylene glycol (MIRALAX / GLYCOLAX) packet Take 17 g by mouth daily. (Patient taking differently: Take 17 g by mouth daily as needed for mild constipation. )  . Probiotic Product (PROBIOTIC PO) Take 1 tablet by mouth daily.  . prochlorperazine (COMPAZINE) 10 MG tablet Take 10 mg by mouth every 6 (six) hours as needed for nausea or vomiting.   . traMADol (ULTRAM) 50 MG tablet Take 1 tablet (50 mg total) by mouth every 6 (six) hours as needed.  . Vitamin D, Ergocalciferol, (DRISDOL) 50000 units CAPS capsule Take 50,000 Units by mouth every 7 (seven) days.   . zoledronic acid (RECLAST) 5 MG/100ML SOLN injection Inject 5 mg into the vein See admin instructions. Pt gets a dose  yearly. Due again 2018  . gabapentin (NEURONTIN) 100 MG capsule Take 1 capsule (100 mg total) by mouth at bedtime.  . [DISCONTINUED] carvedilol (COREG) 25 MG tablet Take 25 mg by mouth 2 (two) times daily. as directed  . [DISCONTINUED] enoxaparin (LOVENOX)  60 MG/0.6ML injection Inject into the skin daily.    Facility-Administered Encounter Medications as of 02/21/2018  Medication  . heparin lock flush 100 unit/mL  . sodium chloride flush (NS) 0.9 % injection 10 mL    ALLERGIES:  No Known Allergies   PHYSICAL EXAM:  ECOG Performance status: 1  Vitals:   02/21/18 1202  BP: (!) 151/78  Pulse: 71  Resp: 16  SpO2: 98%   Filed Weights   02/21/18 1202  Weight: 156 lb 9.6 oz (71 kg)    Physical Exam  Constitutional: He is oriented to person, place, and time. He appears well-developed and well-nourished.  Musculoskeletal: Normal range of motion.  Neurological: He is alert and oriented to person, place, and time.  Skin: Skin is warm and dry.  Psychiatric: He has a normal mood and affect. His behavior is normal. Judgment and thought content normal.     LABORATORY DATA:  I have reviewed the labs as listed.  CBC    Component Value Date/Time   WBC 9.4 02/07/2018 1121   RBC 4.48 02/07/2018 1121   HGB 13.9 02/07/2018 1121   HCT 42.9 02/07/2018 1121   PLT 158 02/07/2018 1121   MCV 95.8 02/07/2018 1121   MCH 31.0 02/07/2018 1121   MCHC 32.4 02/07/2018 1121   RDW 13.2 02/07/2018 1121   LYMPHSABS 2.1 02/07/2018 1121   MONOABS 0.8 02/07/2018 1121   EOSABS 0.2 02/07/2018 1121   BASOSABS 0.0 02/07/2018 1121   CMP Latest Ref Rng & Units 02/07/2018 11/25/2017 09/24/2017  Glucose 70 - 99 mg/dL 113(H) 105(H) 98  BUN 8 - 23 mg/dL 8 9 7(L)  Creatinine 0.61 - 1.24 mg/dL 1.26(H) 1.18 1.32(H)  Sodium 135 - 145 mmol/L 140 140 139  Potassium 3.5 - 5.1 mmol/L 3.7 4.1 4.7  Chloride 98 - 111 mmol/L 112(H) 112(H) 111  CO2 22 - 32 mmol/L 20(L) 20(L) 23  Calcium 8.9 - 10.3 mg/dL 8.2(L) 8.8(L)  8.0(L)  Total Protein 6.5 - 8.1 g/dL 6.3(L) - -  Total Bilirubin 0.3 - 1.2 mg/dL 0.5 - -  Alkaline Phos 38 - 126 U/L 78 - -  AST 15 - 41 U/L 35 - -  ALT 0 - 44 U/L 53(H) - -       DIAGNOSTIC IMAGING:  I have independently reviewed the scans and discussed with the patient.   I have reviewed Francene Finders, NP's note and agree with the documentation.  I personally performed a face-to-face visit, made revisions and my assessment and plan is as follows.     ASSESSMENT & PLAN:   Adenocarcinoma of colon with mucinous features 1.  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 -Denies any bleeding per rectum or melena.  Last colonoscopy/EGD was on 05/15/2015 which was normal. -CT scan in October 2018 was negative for metastatic disease. - Physical examination today is within normal limits.  CEA was also within normal limits.  Does not have any clinical evidence of recurrence or metastatic disease at this time.  ALT is minimally elevated since the start of pancrelipase.  We will closely monitor it.  2.  Iron deficiency: - I have given him Feraheme on 09/05/2017 and 09/12/2017. -She felt better after the Feraheme infusion.  His ferritin improved to 145.  Percent saturation was 43.  Hemoglobin was 13.9.  He does not require  any parenteral iron therapy at this time.  3.  Peripheral neuropathy: -He has neuropathy with tingling and numbness in the feet.  He also feels pins-and-needles sensation in the feet which keeps him awake at times. -I have will start him on gabapentin 100 mg at bedtime and titrate up as needed.      Orders placed this encounter:  Orders Placed This Encounter  Procedures  . CEA  . CBC with Differential/Platelet  . Comprehensive metabolic panel  . Ferritin  . Iron and TIBC  . Lactate dehydrogenase  . Vitamin  B12  . Folate      Derek Jack, MD [place] [phone]

## 2018-02-21 NOTE — Patient Instructions (Signed)
[facility] at [facility] Discharge Instructions  Follow up in 6 months with labs prior.    Thank you for choosing [place] at [facility] to provide your oncology and hematology care.  To afford each patient quality time with our provider, please arrive at least 15 minutes before your scheduled appointment time.   If you have a lab appointment with the [facility] please come in thru the  Main Entrance and check in at the main information desk  You need to re-schedule your appointment should you arrive 10 or more minutes late.  We strive to give you quality time with our providers, and arriving late affects you and other patients whose appointments are after yours.  Also, if you no show three or more times for appointments you may be dismissed from the clinic at the providers discretion.     Again, thank you for choosing [facility].  Our hope is that these requests will decrease the amount of time that you wait before being seen by our physicians.       _____________________________________________________________  Should you have questions after your visit to [facility], please contact our office at [phone] between the hours of 8:00 a.m. and 4:30 p.m.  Voicemails left after 4:00 p.m. will not be returned until the following business day.  For prescription refill requests, have your pharmacy contact our office and allow 72 hours.    Cancer Center Support Programs:   > Cancer Support Group  2nd Tuesday of the month 1pm-2pm, Journey Room

## 2018-03-08 ENCOUNTER — Other Ambulatory Visit: Payer: Self-pay | Admitting: Gastroenterology

## 2018-03-31 ENCOUNTER — Other Ambulatory Visit: Payer: Self-pay

## 2018-03-31 ENCOUNTER — Encounter (HOSPITAL_COMMUNITY): Payer: Self-pay | Admitting: *Deleted

## 2018-03-31 DIAGNOSIS — Z87891 Personal history of nicotine dependence: Secondary | ICD-10-CM | POA: Insufficient documentation

## 2018-03-31 DIAGNOSIS — R531 Weakness: Secondary | ICD-10-CM | POA: Diagnosis present

## 2018-03-31 DIAGNOSIS — M791 Myalgia, unspecified site: Secondary | ICD-10-CM | POA: Insufficient documentation

## 2018-03-31 DIAGNOSIS — I10 Essential (primary) hypertension: Secondary | ICD-10-CM | POA: Insufficient documentation

## 2018-03-31 DIAGNOSIS — E860 Dehydration: Secondary | ICD-10-CM | POA: Insufficient documentation

## 2018-03-31 DIAGNOSIS — Z85038 Personal history of other malignant neoplasm of large intestine: Secondary | ICD-10-CM | POA: Insufficient documentation

## 2018-03-31 DIAGNOSIS — Z79899 Other long term (current) drug therapy: Secondary | ICD-10-CM | POA: Insufficient documentation

## 2018-03-31 NOTE — ED Triage Notes (Signed)
Pt states that he was sent to the er by [facility] in [place] due to elevated protein in his urine, pt reports that he has been feeling fatigued, weak and had a low urine output for the past few days,

## 2018-04-01 ENCOUNTER — Emergency Department (HOSPITAL_COMMUNITY)
Admission: EM | Admit: 2018-04-01 | Discharge: 2018-04-01 | Disposition: A | Discharge status: Home / Self Care | Payer: Medicare Other | Attending: Emergency Medicine | Admitting: Emergency Medicine

## 2018-04-01 ENCOUNTER — Emergency Department (HOSPITAL_COMMUNITY): Payer: Medicare Other

## 2018-04-01 DIAGNOSIS — R531 Weakness: Secondary | ICD-10-CM | POA: Diagnosis not present

## 2018-04-01 DIAGNOSIS — E860 Dehydration: Secondary | ICD-10-CM

## 2018-04-01 HISTORY — DX: Disorder of kidney and ureter, unspecified: N28.9

## 2018-04-01 LAB — CBC WITH DIFFERENTIAL/PLATELET
Abs Immature Granulocytes: 0.09 10*3/uL — ABNORMAL HIGH (ref 0.00–0.07)
Basophils Absolute: 0.1 10*3/uL (ref 0.0–0.1)
Basophils Relative: 0 %
Eosinophils Absolute: 0.2 10*3/uL (ref 0.0–0.5)
Eosinophils Relative: 2 %
HCT: 46.3 % (ref 39.0–52.0)
Hemoglobin: 14.9 g/dL (ref 13.0–17.0)
Immature Granulocytes: 1 %
Lymphocytes Relative: 21 %
Lymphs Abs: 2.8 10*3/uL (ref 0.7–4.0)
MCH: 29.9 pg (ref 26.0–34.0)
MCHC: 32.2 g/dL (ref 30.0–36.0)
MCV: 92.8 fL (ref 80.0–100.0)
Monocytes Absolute: 1 10*3/uL (ref 0.1–1.0)
Monocytes Relative: 7 %
Neutro Abs: 9.2 10*3/uL — ABNORMAL HIGH (ref 1.7–7.7)
Neutrophils Relative %: 69 %
Platelets: 162 10*3/uL (ref 150–400)
RBC: 4.99 MIL/uL (ref 4.22–5.81)
RDW: 13 % (ref 11.5–15.5)
WBC: 13.3 10*3/uL — AB (ref 4.0–10.5)
nRBC: 0 % (ref 0.0–0.2)

## 2018-04-01 LAB — COMPREHENSIVE METABOLIC PANEL
ALT: 38 U/L (ref 0–44)
AST: 28 U/L (ref 15–41)
Albumin: 3.9 g/dL (ref 3.5–5.0)
Alkaline Phosphatase: 83 U/L (ref 38–126)
Anion gap: 9 (ref 5–15)
BUN: 16 mg/dL (ref 8–23)
CALCIUM: 8.4 mg/dL — AB (ref 8.9–10.3)
CO2: 19 mmol/L — ABNORMAL LOW (ref 22–32)
Chloride: 109 mmol/L (ref 98–111)
Creatinine, Ser: 1.21 mg/dL (ref 0.61–1.24)
GFR calc Af Amer: >60 mL/min (ref >60)
GFR calc non Af Amer: >60 mL/min (ref >60)
Glucose, Bld: 102 mg/dL — ABNORMAL HIGH (ref 70–99)
Potassium: 3.8 mmol/L (ref 3.5–5.1)
SODIUM: 137 mmol/L (ref 135–145)
Total Bilirubin: 0.8 mg/dL (ref 0.3–1.2)
Total Protein: 6.7 g/dL (ref 6.5–8.1)

## 2018-04-01 LAB — URINALYSIS, ROUTINE W REFLEX MICROSCOPIC
Bilirubin Urine: NEGATIVE
Glucose, UA: NEGATIVE mg/dL
Hgb urine dipstick: NEGATIVE
KETONES UR: NEGATIVE mg/dL
Leukocytes, UA: NEGATIVE
Nitrite: NEGATIVE
PH: 5 (ref 5.0–8.0)
Protein, ur: NEGATIVE mg/dL
Specific Gravity, Urine: 1.017 (ref 1.005–1.030)

## 2018-04-01 LAB — CK: Total CK: 38 U/L — ABNORMAL LOW (ref 49–397)

## 2018-04-01 MED ORDER — SODIUM CHLORIDE 0.9 % IV BOLUS (SEPSIS)
1000.0000 mL | Freq: Once | INTRAVENOUS | Status: AC
  Administered 2018-04-01: 1000 mL via INTRAVENOUS

## 2018-04-01 MED ORDER — HEPARIN SOD (PORK) LOCK FLUSH 100 UNIT/ML IV SOLN
INTRAVENOUS | Status: AC
  Administered 2018-04-01: 04:00:00
  Filled 2018-04-01: qty 5

## 2018-04-01 MED ORDER — ONDANSETRON HCL 4 MG/2ML IJ SOLN
4.0000 mg | Freq: Once | INTRAMUSCULAR | Status: AC
  Administered 2018-04-01: 4 mg via INTRAVENOUS

## 2018-04-01 MED ORDER — ONDANSETRON HCL 4 MG/2ML IJ SOLN
INTRAMUSCULAR | Status: AC
  Filled 2018-04-01: qty 2

## 2018-04-01 MED ORDER — ONDANSETRON 8 MG PO TBDP: 8.0000 mg | ORAL_TABLET | Freq: Once | ORAL | Status: DC

## 2018-04-01 NOTE — ED Provider Notes (Signed)
[facility] EMERGENCY DEPARTMENT Provider Note   CSN: [id number] Arrival date & time: 03/31/18  2308     History   Chief Complaint Chief Complaint  Patient presents with  . Abnormal Lab    HPI [name] is a 68 y.o. male.  The history is provided by the patient.  Weakness  Severity:  Moderate Onset quality:  Gradual Duration:  2 days Timing:  Intermittent Progression:  Worsening Chronicity:  New Relieved by:  Nothing Worsened by:  Nothing Associated symptoms: diarrhea, myalgias, nausea and shortness of breath   Associated symptoms: no abdominal pain, no chest pain, no cough, no dysuria, no fever, no melena and no vomiting   Patient with history of colon cancer, PE on Lovenox presents with fatigue.  He reports for the past 2 days has had generalized weakness and fatigue.  He also reports myalgias.  No fevers or vomiting.  No chest pain, but reports mild shortness of breath.  No cough. No new meds. He does report some decreased urine output Seen in outside urgent care and found to have proteinuria, sent for further evaluation.  Of Note, his colon CA is in remission  Past Medical History:  Diagnosis Date  . 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 Anemia   . Anxiety   . Arthritis   . Barrett's esophagus   . Blood transfusion   . Bowel obstruction ([place]) 05/13/2012   Recurrent  . Bronchitis   . Chest pain at rest   . Chronic abdominal pain   . Erosive esophagitis   . ETOH abuse    quit 03/2010  . GERD (gastroesophageal reflux disease)   . Hx of Clostridium difficile infection 01/2012  . Hypertension   . Ileus ([place])   . Iron deficiency anemia 03/23/2016  . Obstruction of bowel ([place]) 03/03/14  . Osteoporosis   . Personal history of PE (pulmonary embolism) 10/01/2010  . Pneumonia   . Pulmonary embolism ([place]) 02/2010  . Recurrent upper respiratory infection (URI)   . Renal disorder   . S/P endoscopy September 28, 2010   erosive reflux  esophagitis, Billroth I anatomy  . 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 Seizures ([place])   . Shortness of breath   . TIA (transient ischemic attack) 10/11  . Vitamin B12 deficiency     Patient Active Problem List   Diagnosis Date Noted  . Seizure disorder ([place]) 09/16/2016  . CKD (chronic kidney disease) stage 3, GFR 30-59 ml/min (HCC) 06/14/2016  . Patient has nasogastric tube   . Ileus ([place]) 05/15/2016  . Chronic diastolic heart failure ([place]) 05/15/2016  . Iron deficiency anemia 03/23/2016  . Acute renal failure ([place]) 03/19/2016  . Normocytic anemia 03/19/2016  . Hypotension 03/19/2016  . Anemia   . History of colon cancer, stage III   . Hx of colon cancer, stage III   . History of colonic polyps   . Barrett's esophagus without dysplasia   . Dysphagia   . Essential hypertension 11/30/2014  . Dysphagia, pharyngoesophageal phase   . Mucosal abnormality of esophagus   . SBO (small bowel obstruction) ([place]) 09/17/2013  . Rectal bleeding 07/11/2012  . Bowel obstruction ([place]) 07/11/2012  . Abnormal CT scan, colon 07/11/2012  . Esophageal dysphagia 02/22/2012  . H/O Clostridium difficile infection 02/22/2012  . Diarrhea 09/10/2011  . Cellulitis 09/10/2011  . Nausea 06/07/2011  . Abdominal distention 06/07/2011  . HTN (hypertension) 06/07/2011  . Partial small bowel obstruction ([place])  05/31/2011  . GI bleed 05/27/2011  . Coagulopathy ([place]) 05/27/2011  . Gastroenteritis 05/27/2011  . Small bowel obstruction ([place]) 05/13/2011  . LUQ pain 05/13/2011  . Pulmonary embolism ([place]) 01/17/2011  . Chest pain at rest 01/17/2011  . Pneumonia 12/11/2010  . Hematemesis 12/05/2010  . Chronic abdominal pain 12/05/2010  . Pancytopenia due to antineoplastic chemotherapy ([place]) 12/05/2010  . Coffee ground emesis 11/29/2010  . Esophageal reflux disease 11/15/2010  . 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 Personal history of PE (pulmonary embolism) 10/01/2010  . Bronchitis, acute 10/01/2010  .  Erosive esophagitis 10/01/2010  . Fever chills 09/29/2010  . Bleeding gastrointestinal 09/26/2010  . Supratherapeutic INR 09/26/2010  . Abdominal pain 09/26/2010  . Nausea and vomiting 09/26/2010  . Anemia, chronic disease 09/26/2010  . Adenocarcinoma of colon with mucinous features 09/22/2010    Past Surgical History:  Procedure Laterality Date  . ABDOMINAL ADHESION SURGERY  03/04/15   @ [facility]  . ABDOMINAL EXPLORATION SURGERY    . abdominal sugery     for bowel obstruction x 8, all in 1980s, except for one in 07/2010  . APPENDECTOMY  1980s  . Billroth 1 hemigastrectomy  1980s   per patient for benign duodenal tumor  . CARDIAC CATHETERIZATION  07/17/2012  . CHOLECYSTECTOMY  1980s  . COLON SURGERY  May 2012   left hemicolectomy, colon cancer found at time of surgery for bowel obstruction  . COLONOSCOPY  03/18/2011   anastomosis at 35cm. Several adenomatous polyps removed. Sigmoid diverticulosis. Next TCS 02/2013  . COLONOSCOPY N/A 07/24/2012   NTI:[id number] post segmental resection with normal-appearing colonic anastomosis aside from an adjacent polyp-removed as described above. Rectal polyp-removed as described above. CT findings appear to have been artifactual. tubular adenomas/prolapsed type polyp.  . COLONOSCOPY N/A 05/15/2015   Procedure: COLONOSCOPY;  Surgeon: [name], MD;  Location: [facility];  Service: Endoscopy;  Laterality: N/A;  . COLONOSCOPY WITH PROPOFOL N/A 11/04/2016   Dr. [name], status post subtotal colectomy with normal-appearing residual lower GI tract.  Next colonoscopy in 5 years.  . ESOPHAGOGASTRODUODENOSCOPY  09/28/2010  . ESOPHAGOGASTRODUODENOSCOPY  12/01/2010   Cervical web status post dilation, erosive esophagitis, B1 hemigastrectomy, inflamed anastomosis  . ESOPHAGOGASTRODUODENOSCOPY  04/16/2011   excoriation at GEJ c/w trauma/M-W tear, friable gastric anastomosis, dilation efferent limb  . ESOPHAGOGASTRODUODENOSCOPY N/A 06/03/2014   Dr.[name]- cervcal  esopphageal web s/p dilation. abnormal distal esophagus bx= barretts esophagus  . ESOPHAGOGASTRODUODENOSCOPY N/A 05/15/2015   Procedure: ESOPHAGOGASTRODUODENOSCOPY (EGD);  Surgeon: [name], MD;  Location: [facility];  Service: Endoscopy;  Laterality: N/A;  230  . ESOPHAGOGASTRODUODENOSCOPY (EGD) WITH ESOPHAGEAL DILATION  02/25/2012   QMG:[id number] esophageal web-s/p dilation anddisruption as described above. Status post prior gastric with Billroth I configuration. Abnormal gastric mucosa at the anastomosis. Gastric biopsy showed mild chronic inflammation but no H. pylori   . ESOPHAGOGASTRODUODENOSCOPY (EGD) WITH PROPOFOL N/A 11/04/2016   Dr. [name]: Reflux esophagitis, small hiatal hernia status post hemigastrectomy.  Single patent efferent small bowel limb appeared normal, 2 x 2 centimeter tongue of salmon epithelium again seen, esophageal dilation.  Biopsies consistent with reflux changes, not Barrett's however this was confirmed on prior EGDs.  Offer 3-year follow-up EGD August 2021.  [name] HERNIA REPAIR     right inguinal  . MALONEY DILATION N/A 06/03/2014   Procedure: [name] DILATION;  Surgeon: [name], MD;  Location: [facility];  Service: Endoscopy;  Laterality: N/A;  . MALONEY DILATION N/A 05/15/2015   Procedure: [name]  DILATION;  Surgeon: [name], MD;  Location: [facility];  Service: Endoscopy;  Laterality: N/A;  . [name] DILATION N/A 11/04/2016   Procedure: [name] DILATION;  Surgeon: [name], MD;  Location: [facility];  Service: Endoscopy;  Laterality: N/A;  . PORTACATH PLACEMENT    . SAVORY DILATION N/A 06/03/2014   Procedure: SAVORY DILATION;  Surgeon: [name], MD;  Location: [facility];  Service: Endoscopy;  Laterality: N/A;        Home Medications    Prior to Admission medications   Medication Sig Start Date End Date Taking? Authorizing Provider  ammonium lactate (AMLACTIN) 12 % cream APPLY CREAM TO DRY SKIN ON LEGS ARMS HANDS AND BACK  AREA ONCE TO TWICE DAILY (AVOID FACE GROIN UNDERARMS) 11/22/17   [provider]  atorvastatin (LIPITOR) 20 MG tablet Take 20 mg by mouth at bedtime.     [provider]  carvedilol (COREG) 25 MG tablet  12/04/17   [provider]  Cholecalciferol (VITAMIN D) 2000 units CAPS Take 2,000 Units by mouth daily.    [provider]  Cholecalciferol (VITAMIN D3 ULTRA POTENCY) 1.25 MG (50000 UT) TABS  12/04/17   [provider]  docusate sodium (COLACE) 100 MG capsule Take 200 mg by mouth daily as needed for mild constipation.     [provider]  enoxaparin (LOVENOX) 60 MG/0.6ML injection  12/04/17   [provider]  gabapentin (NEURONTIN) 100 MG capsule Take 1 capsule (100 mg total) by mouth at bedtime. 02/21/18   Lockamy, Randi L, NP-C  lansoprazole (PREVACID) 30 MG capsule TAKE 1 CAPSULE BY MOUTH TWICE DAILY BEFORE A MEAL 03/08/18   Carlis Stable, NP  lidocaine-prilocaine (EMLA) cream Apply 1 application topically as needed (prior to accessing port). 05/16/17   Holley Bouche, NP  LORazepam (ATIVAN) 1 MG tablet Take 1 tablet (1 mg total) by mouth every 8 (eight) hours as needed for anxiety. Patient taking differently: Take 1 mg by mouth daily.  05/23/16   Elgergawy, Silver Huguenin, MD  mirtazapine (REMERON) 30 MG tablet Take 1 tablet (30 mg total) by mouth at bedtime. Take 1/2 pill for first 7 days. Then take 1 whole pill daily thereafter. 02/13/18   Derek Jack, MD  Multiple Vitamin (MULTIVITAMIN WITH MINERALS) TABS tablet Take 1 tablet by mouth daily.    [provider]  Nutritional Supplements (ENSURE PLUS HN) LIQD Take 1 Bottle daily by mouth.     [provider]  ondansetron (ZOFRAN ODT) 4 MG disintegrating tablet Take 1 tablet (4 mg total) by mouth every 8 (eight) hours as needed for nausea or vomiting. 12/21/16   LongWonda Olds, MD  Pancrelipase, Lip-Prot-Amyl, 24000-76000 units CPEP Take 2 capsules with meals(breakfast,  lunch, dinner) and one snack 01/18/18   [name], Cristopher Estimable, MD  polyethylene glycol (MIRALAX / GLYCOLAX) packet Take 17 g by mouth daily. Patient taking differently: Take 17 g by mouth daily as needed for mild constipation.  09/19/16   Kathie Dike, MD  Probiotic Product (PROBIOTIC PO) Take 1 tablet by mouth daily.    [provider]  prochlorperazine (COMPAZINE) 10 MG tablet Take 10 mg by mouth every 6 (six) hours as needed for nausea or vomiting.     [provider]  Vitamin D, Ergocalciferol, (DRISDOL) 50000 units CAPS capsule Take 50,000 Units by mouth every 7 (seven) days.     [provider]  zoledronic acid (RECLAST) 5 MG/100ML SOLN injection  Inject 5 mg into the vein See admin instructions. Pt gets a dose yearly. Due again 2018    [provider]    Family History Family History  Problem Relation Age of Onset  . Hypertension Mother   . Arthritis Mother   . Pneumonia Mother   . Hypertension Father   . Heart attack Father   . Colon cancer Neg Hx     Social History Social History   Tobacco Use  . Smoking status: Former Smoker    Packs/day: 0.50    Years: 40.00    Pack years: 20.00    Types: Cigarettes    Last attempt to quit: 12/20/2012    Years since quitting: 5.2  . Smokeless tobacco: Never Used  Substance Use Topics  . Alcohol use: No  . Drug use: No     Allergies   Patient has no known allergies.   Review of Systems Review of Systems  Constitutional: Positive for fatigue. Negative for fever.  Respiratory: Positive for shortness of breath. Negative for cough.   Cardiovascular: Negative for chest pain.  Gastrointestinal: Positive for diarrhea and nausea. Negative for abdominal pain, blood in stool, melena and vomiting.  Genitourinary: Negative for dysuria.  Musculoskeletal: Positive for myalgias.  Neurological: Positive for weakness.  All other systems reviewed and are negative.    Physical Exam Updated Vital Signs BP  (!) 178/96   Pulse 66   Temp 98 F (36.7 C)   Resp 18   Ht 1.753 m (5\' 9" )   Wt 70.3 kg   SpO2 99%   BMI 22.89 kg/m   Physical Exam CONSTITUTIONAL: Well developed/well nourished, no distress noted HEAD: Normocephalic/atraumatic EYES: EOMI ENMT: Mucous membranes moist NECK: supple no meningeal signs SPINE/BACK:entire spine nontender CV: S1/S2 noted, no murmurs/rubs/gallops noted LUNGS: Lungs are clear to auscultation bilaterally, no apparent distress ABDOMEN: soft, nontender, no rebound or guarding, bowel sounds noted throughout abdomen GU:no cva tenderness NEURO: Pt is awake/alert/appropriate, moves all extremitiesx4.  No facial droop.  No arm or leg drift EXTREMITIES: pulses normal/equal, full ROM SKIN: warm, color normal PSYCH: no abnormalities of mood noted, alert and oriented to situation   ED Treatments / Results  Labs (all labs ordered are listed, but only abnormal results are displayed) Labs Reviewed  CBC WITH DIFFERENTIAL/PLATELET - Abnormal; Notable for the following components:      Result Value   WBC 13.3 (*)    Neutro Abs 9.2 (*)    Abs Immature Granulocytes 0.09 (*)    All other components within normal limits  COMPREHENSIVE METABOLIC PANEL - Abnormal; Notable for the following components:   CO2 19 (*)    Glucose, Bld 102 (*)    Calcium 8.4 (*)    All other components within normal limits  URINALYSIS, ROUTINE W REFLEX MICROSCOPIC - Abnormal; Notable for the following components:   APPearance HAZY (*)    All other components within normal limits  CK - Abnormal; Notable for the following components:   Total CK 38 (*)    All other components within normal limits    EKG EKG Interpretation  Date/Time:  Saturday April 01 2018 00:35:27 EST Ventricular Rate:  82 PR Interval:    QRS Duration: 86 QT Interval:  369 QTC Calculation: 431 R Axis:   -19 Text Interpretation:  Sinus rhythm Borderline left axis deviation Minimal ST depression, lateral leads  No significant change since last tracing Confirmed by Ripley Fraise [phone]) on 04/01/2018 12:58:07 AM   Radiology Dg  Chest 2 View  Result Date: 04/01/2018 CLINICAL DATA:  68 y/o M; shortness of breath, low urine output, proteinuria. EXAM: CHEST - 2 VIEW COMPARISON:  11/25/2017 chest radiograph FINDINGS: Stable normal cardiac silhouette given projection and technique. Left central venous catheter tip projects over upper SVC and is stable. Aortic atherosclerosis with calcification. Pulmonary venous hypertension. No focal consolidation. No pleural effusion or pneumothorax. No acute osseous abnormality identified. Surgical clips project over the upper abdomen. Stable T8 moderate compression deformity. No acute osseous abnormality identified. IMPRESSION: Pulmonary venous hypertension. No focal consolidation. Aortic atherosclerosis. Electronically Signed   By: Kristine Garbe M.D.   On: 04/01/2018 01:52    Procedures Procedures  Medications Ordered in ED Medications  heparin lock flush 100 UNIT/ML injection (has no administration in time range)  sodium chloride 0.9 % bolus 1,000 mL (0 mLs Intravenous Stopped 04/01/18 0350)  ondansetron (ZOFRAN) injection 4 mg (4 mg Intravenous Given 04/01/18 0118)     Initial Impression / Assessment and Plan / ED Course  I have reviewed the triage vital signs and the nursing notes.  Pertinent labs & imaging results that were available during my care of the patient were reviewed by me and considered in my medical decision making (see chart for details).    4:10 AM History of colon CA in remission presents with generalized weakness and fatigue.  Overall his labs are reassuring.  No proteinuria or signs of UTI.  He has mild dehydration.  EKG is unremarkable.  Chest x-ray without acute findings. Patient reports shortness of breath but is in no acute distress. At this point, I feel he is appropriate for discharge home.  Discussed need for follow-up with  PCP. Final Clinical Impressions(s) / ED Diagnoses   Final diagnoses:  Weakness  Dehydration    ED Discharge Orders    None       Ripley Fraise, MD 04/01/18 [phone]

## 2018-04-01 NOTE — Discharge Instructions (Addendum)

## 2018-04-10 ENCOUNTER — Encounter: Payer: Self-pay | Admitting: Internal Medicine

## 2018-04-14 ENCOUNTER — Other Ambulatory Visit (HOSPITAL_COMMUNITY): Payer: Self-pay | Admitting: Nurse Practitioner

## 2018-04-14 DIAGNOSIS — C189 Malignant neoplasm of colon, unspecified: Secondary | ICD-10-CM

## 2018-05-10 ENCOUNTER — Encounter (HOSPITAL_COMMUNITY): Payer: Self-pay | Admitting: Emergency Medicine

## 2018-05-10 ENCOUNTER — Emergency Department (HOSPITAL_COMMUNITY): Payer: Medicare Other

## 2018-05-10 ENCOUNTER — Emergency Department (HOSPITAL_COMMUNITY)
Admission: EM | Admit: 2018-05-10 | Discharge: 2018-05-10 | Disposition: A | Discharge status: Home / Self Care | Payer: Medicare Other | Attending: Emergency Medicine | Admitting: Emergency Medicine

## 2018-05-10 ENCOUNTER — Other Ambulatory Visit: Payer: Self-pay

## 2018-05-10 ENCOUNTER — Encounter (HOSPITAL_COMMUNITY): Payer: Self-pay

## 2018-05-10 ENCOUNTER — Inpatient Hospital Stay (HOSPITAL_COMMUNITY): Payer: Medicare Other | Attending: Hematology

## 2018-05-10 DIAGNOSIS — N183 Chronic kidney disease, stage 3 (moderate): Secondary | ICD-10-CM | POA: Insufficient documentation

## 2018-05-10 DIAGNOSIS — Z87891 Personal history of nicotine dependence: Secondary | ICD-10-CM | POA: Insufficient documentation

## 2018-05-10 DIAGNOSIS — I5032 Chronic diastolic (congestive) heart failure: Secondary | ICD-10-CM | POA: Diagnosis not present

## 2018-05-10 DIAGNOSIS — Z85038 Personal history of other malignant neoplasm of large intestine: Secondary | ICD-10-CM | POA: Insufficient documentation

## 2018-05-10 DIAGNOSIS — R140 Abdominal distension (gaseous): Secondary | ICD-10-CM | POA: Insufficient documentation

## 2018-05-10 DIAGNOSIS — I130 Hypertensive heart and chronic kidney disease with heart failure and stage 1 through stage 4 chronic kidney disease, or unspecified chronic kidney disease: Secondary | ICD-10-CM | POA: Insufficient documentation

## 2018-05-10 DIAGNOSIS — Z86711 Personal history of pulmonary embolism: Secondary | ICD-10-CM | POA: Diagnosis not present

## 2018-05-10 DIAGNOSIS — Z79899 Other long term (current) drug therapy: Secondary | ICD-10-CM | POA: Diagnosis not present

## 2018-05-10 DIAGNOSIS — R1084 Generalized abdominal pain: Secondary | ICD-10-CM | POA: Diagnosis present

## 2018-05-10 DIAGNOSIS — Z8673 Personal history of transient ischemic attack (TIA), and cerebral infarction without residual deficits: Secondary | ICD-10-CM | POA: Diagnosis not present

## 2018-05-10 DIAGNOSIS — Z9221 Personal history of antineoplastic chemotherapy: Secondary | ICD-10-CM | POA: Insufficient documentation

## 2018-05-10 DIAGNOSIS — R110 Nausea: Secondary | ICD-10-CM | POA: Insufficient documentation

## 2018-05-10 LAB — COMPREHENSIVE METABOLIC PANEL
ALK PHOS: 73 U/L (ref 38–126)
ALT: 32 U/L (ref 0–44)
AST: 28 U/L (ref 15–41)
Albumin: 3.9 g/dL (ref 3.5–5.0)
Anion gap: 9 (ref 5–15)
BUN: 13 mg/dL (ref 8–23)
CO2: 23 mmol/L (ref 22–32)
Calcium: 9.1 mg/dL (ref 8.9–10.3)
Chloride: 107 mmol/L (ref 98–111)
Creatinine, Ser: 1.55 mg/dL — ABNORMAL HIGH (ref 0.61–1.24)
GFR calc Af Amer: 53 mL/min — ABNORMAL LOW (ref >60)
GFR, EST NON AFRICAN AMERICAN: 46 mL/min — AB (ref >60)
GLUCOSE: 100 mg/dL — AB (ref 70–99)
Potassium: 4.8 mmol/L (ref 3.5–5.1)
Sodium: 139 mmol/L (ref 135–145)
Total Bilirubin: 0.8 mg/dL (ref 0.3–1.2)
Total Protein: 6.8 g/dL (ref 6.5–8.1)

## 2018-05-10 LAB — CBC
HCT: 48.7 % (ref 39.0–52.0)
Hemoglobin: 15.4 g/dL (ref 13.0–17.0)
MCH: 30.6 pg (ref 26.0–34.0)
MCHC: 31.6 g/dL (ref 30.0–36.0)
MCV: 96.6 fL (ref 80.0–100.0)
Platelets: 150 10*3/uL (ref 150–400)
RBC: 5.04 MIL/uL (ref 4.22–5.81)
RDW: 13.1 % (ref 11.5–15.5)
WBC: 10.9 10*3/uL — ABNORMAL HIGH (ref 4.0–10.5)
nRBC: 0 % (ref 0.0–0.2)

## 2018-05-10 LAB — URINALYSIS, ROUTINE W REFLEX MICROSCOPIC
Bilirubin Urine: NEGATIVE
Glucose, UA: NEGATIVE mg/dL
Hgb urine dipstick: NEGATIVE
Ketones, ur: NEGATIVE mg/dL
Leukocytes,Ua: NEGATIVE
Nitrite: NEGATIVE
Protein, ur: NEGATIVE mg/dL
Specific Gravity, Urine: 1.002 — ABNORMAL LOW (ref 1.005–1.030)
pH: 5 (ref 5.0–8.0)

## 2018-05-10 LAB — LIPASE, BLOOD: Lipase: 33 U/L (ref 11–51)

## 2018-05-10 MED ORDER — SODIUM CHLORIDE 0.9% FLUSH
10.0000 mL | INTRAVENOUS | Status: DC | PRN
  Administered 2018-05-10: 10 mL via INTRAVENOUS
  Filled 2018-05-10: qty 10

## 2018-05-10 MED ORDER — ONDANSETRON HCL 4 MG/2ML IJ SOLN
4.0000 mg | Freq: Once | INTRAMUSCULAR | Status: AC
  Administered 2018-05-10: 4 mg via INTRAVENOUS
  Filled 2018-05-10: qty 2

## 2018-05-10 MED ORDER — HYDROCODONE-ACETAMINOPHEN 5-325 MG PO TABS: 2.0000 | ORAL_TABLET | ORAL | 0 refills | Status: DC | PRN

## 2018-05-10 MED ORDER — HEPARIN SOD (PORK) LOCK FLUSH 100 UNIT/ML IV SOLN
500.0000 [IU] | Freq: Once | INTRAVENOUS | Status: AC
  Administered 2018-05-10: 500 [IU] via INTRAVENOUS

## 2018-05-10 MED ORDER — ONDANSETRON 4 MG PO TBDP: 4.0000 mg | ORAL_TABLET | Freq: Three times a day (TID) | ORAL | 1 refills | Status: DC | PRN

## 2018-05-10 MED ORDER — ONDANSETRON HCL 4 MG/2ML IJ SOLN
4.0000 mg | Freq: Once | INTRAMUSCULAR | Status: DC
  Filled 2018-05-10: qty 2

## 2018-05-10 MED ORDER — SODIUM CHLORIDE 0.9 % IV SOLN
INTRAVENOUS | Status: DC
  Administered 2018-05-10: 18:00:00 via INTRAVENOUS

## 2018-05-10 MED ORDER — HYDROMORPHONE HCL 1 MG/ML IJ SOLN
1.0000 mg | Freq: Once | INTRAMUSCULAR | Status: AC
  Administered 2018-05-10: 1 mg via INTRAVENOUS
  Filled 2018-05-10: qty 1

## 2018-05-10 MED ORDER — HEPARIN SOD (PORK) LOCK FLUSH 100 UNIT/ML IV SOLN
INTRAVENOUS | Status: AC
  Administered 2018-05-10: 500 [IU]
  Filled 2018-05-10: qty 5

## 2018-05-10 MED ORDER — SODIUM CHLORIDE 0.9 % IV BOLUS
500.0000 mL | Freq: Once | INTRAVENOUS | Status: AC
  Administered 2018-05-10: 500 mL via INTRAVENOUS

## 2018-05-10 MED ORDER — SODIUM CHLORIDE 0.9% FLUSH
3.0000 mL | Freq: Once | INTRAVENOUS | Status: AC
  Administered 2018-05-10: 3 mL via INTRAVENOUS

## 2018-05-10 MED ORDER — IOHEXOL 300 MG/ML  SOLN
75.0000 mL | Freq: Once | INTRAMUSCULAR | Status: AC | PRN
  Administered 2018-05-10: 75 mL via INTRAVENOUS

## 2018-05-10 NOTE — ED Triage Notes (Signed)
Lt sided abd pain and nausea that started today.  Hx of bowel obstructions

## 2018-05-10 NOTE — Discharge Instructions (Addendum)
No evidence of any bowel obstruction on today's work-up.  However if symptoms get worse or return for reevaluation.  Take the hydrocodone as needed for pain.  Make an appointment to follow-up with your doctor.

## 2018-05-10 NOTE — Progress Notes (Signed)
[name] tolerated port flush well without complaints or incident. Port accessed with 20 gauge needle with blood return noted then flushed with 10 ml NS and 5 ml Heparin easily per protocol then de-accessed. VSS Pt discharged self ambulatory in satisfactory condition

## 2018-05-10 NOTE — Patient Instructions (Signed)
[address] at [facility] Discharge Instructions  Portacath flushed per protocol today. Follow-up as scheduled. Call clinic for any questions or concerns   Thank you for choosing [place] at [facility] to provide your oncology and hematology care.  To afford each patient quality time with our provider, please arrive at least 15 minutes before your scheduled appointment time.   If you have a lab appointment with the [facility] please come in thru the  Main Entrance and check in at the main information desk  You need to re-schedule your appointment should you arrive 10 or more minutes late.  We strive to give you quality time with our providers, and arriving late affects you and other patients whose appointments are after yours.  Also, if you no show three or more times for appointments you may be dismissed from the clinic at the providers discretion.     Again, thank you for choosing [facility].  Our hope is that these requests will decrease the amount of time that you wait before being seen by our physicians.       _____________________________________________________________  Should you have questions after your visit to [facility], please contact our office at [phone] between the hours of 8:00 a.m. and 4:30 p.m.  Voicemails left after 4:00 p.m. will not be returned until the following business day.  For prescription refill requests, have your pharmacy contact our office and allow 72 hours.    Cancer Center Support Programs:   > Cancer Support Group  2nd Tuesday of the month 1pm-2pm, Journey Room

## 2018-05-10 NOTE — ED Provider Notes (Signed)
[facility] EMERGENCY DEPARTMENT Provider Note   CSN: [id number] Arrival date & time: 05/10/18  1211    History   Chief Complaint Chief Complaint  Patient presents with  . Abdominal Pain    HPI RAJA LISKA is a 68 y.o. male.     Patient with a history of stage III colon cancer back in 2012.  Has been cancer free.  Patient daughter with abdominal pain mostly left-sided today.  Was concerned about bowel obstruction.  Associated with nausea no vomiting symptoms started about 10 this morning.  No diarrhea.     Past Medical History:  Diagnosis Date  . 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 Anemia   . Anxiety   . Arthritis   . Barrett's esophagus   . Blood transfusion   . Bowel obstruction ([place]) 05/13/2012   Recurrent  . Bronchitis   . Chest pain at rest   . Chronic abdominal pain   . Erosive esophagitis   . ETOH abuse    quit 03/2010  . GERD (gastroesophageal reflux disease)   . Hx of Clostridium difficile infection 01/2012  . Hypertension   . Ileus ([place])   . Iron deficiency anemia 03/23/2016  . Obstruction of bowel ([place]) 03/03/14  . Osteoporosis   . Personal history of PE (pulmonary embolism) 10/01/2010  . Pneumonia   . Pulmonary embolism ([place]) 02/2010  . Recurrent upper respiratory infection (URI)   . Renal disorder   . S/P endoscopy September 28, 2010   erosive reflux esophagitis, Billroth I anatomy  . 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 Seizures ([place])   . Shortness of breath   . TIA (transient ischemic attack) 10/11  . Vitamin B12 deficiency     Patient Active Problem List   Diagnosis Date Noted  . Seizure disorder ([place]) 09/16/2016  . CKD (chronic kidney disease) stage 3, GFR 30-59 ml/min (HCC) 06/14/2016  . Patient has nasogastric tube   . Ileus ([place]) 05/15/2016  . Chronic diastolic heart failure ([place]) 05/15/2016  . Iron deficiency anemia 03/23/2016  . Acute renal failure ([name]) 03/19/2016  . Normocytic anemia 03/19/2016  .  Hypotension 03/19/2016  . Anemia   . History of colon cancer, stage III   . Hx of colon cancer, stage III   . History of colonic polyps   . Barrett's esophagus without dysplasia   . Dysphagia   . Essential hypertension 11/30/2014  . Dysphagia, pharyngoesophageal phase   . Mucosal abnormality of esophagus   . SBO (small bowel obstruction) ([place]) 09/17/2013  . Rectal bleeding 07/11/2012  . Bowel obstruction ([place]) 07/11/2012  . Abnormal CT scan, colon 07/11/2012  . Esophageal dysphagia 02/22/2012  . H/O Clostridium difficile infection 02/22/2012  . Diarrhea 09/10/2011  . Cellulitis 09/10/2011  . Nausea 06/07/2011  . Abdominal distention 06/07/2011  . HTN (hypertension) 06/07/2011  . Partial small bowel obstruction ([place]) 05/31/2011  . GI bleed 05/27/2011  . Coagulopathy ([place]) 05/27/2011  . Gastroenteritis 05/27/2011  . Small bowel obstruction ([place]) 05/13/2011  . LUQ pain 05/13/2011  . Pulmonary embolism ([place]) 01/17/2011  . Chest pain at rest 01/17/2011  . Pneumonia 12/11/2010  . Hematemesis 12/05/2010  . Chronic abdominal pain 12/05/2010  . Pancytopenia due to antineoplastic chemotherapy ([place]) 12/05/2010  . Coffee ground emesis 11/29/2010  . Esophageal reflux disease 11/15/2010  . 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 Personal history of PE (pulmonary embolism) 10/01/2010  . Bronchitis, acute 10/01/2010  . Erosive esophagitis 10/01/2010  .  Fever chills 09/29/2010  . Bleeding gastrointestinal 09/26/2010  . Supratherapeutic INR 09/26/2010  . Abdominal pain 09/26/2010  . Nausea and vomiting 09/26/2010  . Anemia, chronic disease 09/26/2010  . Adenocarcinoma of colon with mucinous features 09/22/2010    Past Surgical History:  Procedure Laterality Date  . ABDOMINAL ADHESION SURGERY  03/04/15   @ [facility]  . ABDOMINAL EXPLORATION SURGERY    . abdominal sugery     for bowel obstruction x 8, all in 1980s, except for one in 07/2010  . APPENDECTOMY  1980s  . Billroth 1  hemigastrectomy  1980s   per patient for benign duodenal tumor  . CARDIAC CATHETERIZATION  07/17/2012  . CHOLECYSTECTOMY  1980s  . COLON SURGERY  May 2012   left hemicolectomy, colon cancer found at time of surgery for bowel obstruction  . COLONOSCOPY  03/18/2011   anastomosis at 35cm. Several adenomatous polyps removed. Sigmoid diverticulosis. Next TCS 02/2013  . COLONOSCOPY N/A 07/24/2012   WUX:[id number] post segmental resection with normal-appearing colonic anastomosis aside from an adjacent polyp-removed as described above. Rectal polyp-removed as described above. CT findings appear to have been artifactual. tubular adenomas/prolapsed type polyp.  . COLONOSCOPY N/A 05/15/2015   Procedure: COLONOSCOPY;  Surgeon: [name], MD;  Location: [facility] ENDO SUITE;  Service: Endoscopy;  Laterality: N/A;  . COLONOSCOPY WITH PROPOFOL N/A 11/04/2016   Dr. [name], status post subtotal colectomy with normal-appearing residual lower GI tract.  Next colonoscopy in 5 years.  . ESOPHAGOGASTRODUODENOSCOPY  09/28/2010  . ESOPHAGOGASTRODUODENOSCOPY  12/01/2010   Cervical web status post dilation, erosive esophagitis, B1 hemigastrectomy, inflamed anastomosis  . ESOPHAGOGASTRODUODENOSCOPY  04/16/2011   excoriation at GEJ c/w trauma/M-W tear, friable gastric anastomosis, dilation efferent limb  . ESOPHAGOGASTRODUODENOSCOPY N/A 06/03/2014   Dr.[name]- cervcal esopphageal web s/p dilation. abnormal distal esophagus bx= barretts esophagus  . ESOPHAGOGASTRODUODENOSCOPY N/A 05/15/2015   Procedure: ESOPHAGOGASTRODUODENOSCOPY (EGD);  Surgeon: [name], MD;  Location: [facility];  Service: Endoscopy;  Laterality: N/A;  230  . ESOPHAGOGASTRODUODENOSCOPY (EGD) WITH ESOPHAGEAL DILATION  02/25/2012   [id number] esophageal web-s/p dilation anddisruption as described above. Status post prior gastric with Billroth I configuration. Abnormal gastric mucosa at the anastomosis. Gastric biopsy showed mild chronic inflammation but no H.  pylori   . ESOPHAGOGASTRODUODENOSCOPY (EGD) WITH PROPOFOL N/A 11/04/2016   Dr. [name]: Reflux esophagitis, small hiatal hernia status post hemigastrectomy.  Single patent efferent small bowel limb appeared normal, 2 x 2 centimeter tongue of salmon epithelium again seen, esophageal dilation.  Biopsies consistent with reflux changes, not Barrett's however this was confirmed on prior EGDs.  Offer 3-year follow-up EGD August 2021.  [name] HERNIA REPAIR     right inguinal  . MALONEY DILATION N/A 06/03/2014   Procedure: [name] DILATION;  Surgeon: [name], MD;  Location: [facility];  Service: Endoscopy;  Laterality: N/A;  [name] DILATION N/A 05/15/2015   Procedure: [name] DILATION;  Surgeon: [name], MD;  Location: [facility];  Service: Endoscopy;  Laterality: N/A;  . [name] DILATION N/A 11/04/2016   Procedure: [name] DILATION;  Surgeon: [name], MD;  Location: [facility];  Service: Endoscopy;  Laterality: N/A;  . PORTACATH PLACEMENT    . SAVORY DILATION N/A 06/03/2014   Procedure: SAVORY DILATION;  Surgeon: [name], MD;  Location: [facility];  Service: Endoscopy;  Laterality: N/A;        Home Medications    Prior to Admission medications   Medication Sig Start Date  End Date Taking? Authorizing Provider  ammonium lactate (AMLACTIN) 12 % cream APPLY CREAM TO DRY SKIN ON LEGS ARMS HANDS AND BACK AREA ONCE TO TWICE DAILY (AVOID FACE GROIN UNDERARMS) 11/22/17   [provider]  atorvastatin (LIPITOR) 20 MG tablet Take 20 mg by mouth at bedtime.     [provider]  azithromycin (ZITHROMAX) 250 MG tablet TAKE 2 TABLETS BY MOUTH ON DAY 1 AND THEN TAKE 1 TABLET BY MOUTH ONCE A DAY ON DAY 2 THROUGH DAY 5 03/27/18   [provider]  carvedilol (COREG) 25 MG tablet  12/04/17   [provider]  Cholecalciferol (VITAMIN D) 2000 units CAPS Take 2,000 Units by mouth daily.    [provider]  Cholecalciferol (VITAMIN D3 ULTRA  POTENCY) 1.25 MG (50000 UT) TABS  12/04/17   [provider]  docusate sodium (COLACE) 100 MG capsule Take 200 mg by mouth daily as needed for mild constipation.     [provider]  enoxaparin (LOVENOX) 60 MG/0.6ML injection  12/04/17   [provider]  gabapentin (NEURONTIN) 100 MG capsule TAKE 1 CAPSULE BY MOUTH AT BEDTIME 04/14/18   Lockamy, Randi L, NP-C  HYDROcodone-acetaminophen (NORCO/VICODIN) 5-325 MG tablet Take 2 tablets by mouth every 4 (four) hours as needed. 05/10/18   Fredia Sorrow, MD  lansoprazole (PREVACID) 30 MG capsule TAKE 1 CAPSULE BY MOUTH TWICE DAILY BEFORE A MEAL 03/08/18   Carlis Stable, NP  lidocaine-prilocaine (EMLA) cream Apply 1 application topically as needed (prior to accessing port). 05/16/17   Holley Bouche, NP  LORazepam (ATIVAN) 1 MG tablet Take 1 tablet (1 mg total) by mouth every 8 (eight) hours as needed for anxiety. Patient taking differently: Take 1 mg by mouth daily.  05/23/16   Elgergawy, Silver Huguenin, MD  mirtazapine (REMERON) 30 MG tablet Take 1 tablet (30 mg total) by mouth at bedtime. Take 1/2 pill for first 7 days. Then take 1 whole pill daily thereafter. 02/13/18   Derek Jack, MD  Multiple Vitamin (MULTIVITAMIN WITH MINERALS) TABS tablet Take 1 tablet by mouth daily.    [provider]  Nutritional Supplements (ENSURE PLUS HN) LIQD Take 1 Bottle daily by mouth.     [provider]  ondansetron (ZOFRAN ODT) 4 MG disintegrating tablet Take 1 tablet (4 mg total) by mouth every 8 (eight) hours as needed for nausea or vomiting. 12/21/16   LongWonda Olds, MD  Pancrelipase, Lip-Prot-Amyl, 24000-76000 units CPEP Take 2 capsules with meals(breakfast, lunch, dinner) and one snack 01/18/18   [name], Cristopher Estimable, MD  polyethylene glycol (MIRALAX / GLYCOLAX) packet Take 17 g by mouth daily. Patient taking differently: Take 17 g by mouth daily as needed for mild constipation.  09/19/16   Kathie Dike, MD  Probiotic  Product (PROBIOTIC PO) Take 1 tablet by mouth daily.    [provider]  prochlorperazine (COMPAZINE) 10 MG tablet Take 10 mg by mouth every 6 (six) hours as needed for nausea or vomiting.     [provider]  traMADol (ULTRAM) 50 MG tablet Take 2 tablets by mouth once every 6 hours as needed. 04/14/18   [provider]  Vitamin D, Ergocalciferol, (DRISDOL) 50000 units CAPS capsule Take 50,000 Units by mouth every 7 (seven) days.     [provider]  zoledronic acid (RECLAST) 5 MG/100ML SOLN injection Inject 5 mg into the vein See admin instructions. Pt gets a dose yearly. Due again 2018    [provider]  Family History Family History  Problem Relation Age of Onset  . Hypertension Mother   . Arthritis Mother   . Pneumonia Mother   . Hypertension Father   . Heart attack Father   . Colon cancer Neg Hx     Social History Social History   Tobacco Use  . Smoking status: Former Smoker    Packs/day: 0.50    Years: 40.00    Pack years: 20.00    Types: Cigarettes    Last attempt to quit: 12/20/2012    Years since quitting: 5.3  . Smokeless tobacco: Never Used  Substance Use Topics  . Alcohol use: No  . Drug use: No     Allergies   Patient has no known allergies.   Review of Systems Review of Systems  Constitutional: Negative for chills and fever.  HENT: Negative for congestion, rhinorrhea and sore throat.   Eyes: Negative for visual disturbance.  Respiratory: Negative for cough and shortness of breath.   Cardiovascular: Negative for chest pain and leg swelling.  Gastrointestinal: Positive for abdominal pain and nausea. Negative for diarrhea and vomiting.  Genitourinary: Negative for dysuria.  Musculoskeletal: Negative for back pain and neck pain.  Skin: Negative for rash.  Neurological: Negative for dizziness, light-headedness and headaches.  Hematological: Does not bruise/bleed easily.  Psychiatric/Behavioral: Negative for  confusion.     Physical Exam Updated Vital Signs BP (!) 170/98 (BP Location: Right Arm)   Pulse 64   Temp 97.6 F (36.4 C) (Oral)   Resp 19   Ht 1.753 m (5\' 9" )   Wt 70.3 kg   SpO2 96%   BMI 22.89 kg/m   Physical Exam Vitals signs and nursing note reviewed.  Constitutional:      Appearance: He is well-developed.  HENT:     Head: Normocephalic and atraumatic.     Mouth/Throat:     Mouth: Mucous membranes are moist.  Eyes:     Extraocular Movements: Extraocular movements intact.     Conjunctiva/sclera: Conjunctivae normal.     Pupils: Pupils are equal, round, and reactive to light.  Neck:     Musculoskeletal: Normal range of motion and neck supple.  Cardiovascular:     Rate and Rhythm: Normal rate and regular rhythm.     Heart sounds: Normal heart sounds. No murmur.  Pulmonary:     Effort: Pulmonary effort is normal. No respiratory distress.     Breath sounds: Normal breath sounds.  Abdominal:     General: Bowel sounds are normal. There is distension.     Palpations: Abdomen is soft.     Tenderness: There is no abdominal tenderness.     Comments: Mild distention  Musculoskeletal: Normal range of motion.  Skin:    General: Skin is warm and dry.  Neurological:     General: No focal deficit present.     Mental Status: He is alert and oriented to person, place, and time.      ED Treatments / Results  Labs (all labs ordered are listed, but only abnormal results are displayed) Labs Reviewed  COMPREHENSIVE METABOLIC PANEL - Abnormal; Notable for the following components:      Result Value   Glucose, Bld 100 (*)    Creatinine, Ser 1.55 (*)    GFR calc non Af Amer 46 (*)    GFR calc Af Amer 53 (*)    All other components within normal limits  CBC - Abnormal; Notable for the following components:   WBC 10.9 (*)  All other components within normal limits  URINALYSIS, ROUTINE W REFLEX MICROSCOPIC - Abnormal; Notable for the following components:   Specific  Gravity, Urine 1.002 (*)    All other components within normal limits  LIPASE, BLOOD    EKG None  Radiology Ct Abdomen Pelvis W Contrast  Result Date: 05/10/2018 CLINICAL DATA:  Left-sided abdominal pain and nausea starting today. History of bowel obstructions and colon cancer with surgery in 2012 with chemotherapy. Cholecystectomy and appendectomy. EXAM: CT ABDOMEN AND PELVIS WITH CONTRAST TECHNIQUE: Multidetector CT imaging of the abdomen and pelvis was performed using the standard protocol following bolus administration of intravenous contrast. CONTRAST:  70mL OMNIPAQUE IOHEXOL 300 MG/ML  SOLN COMPARISON:  09/02/2017 FINDINGS: Lower chest: The included heart size is normal without pericardial effusion or thickening. Dependent atelectasis with linear scarring and/or subsegmental atelectasis medially at the right lung base. Hepatobiliary: Status post cholecystectomy with reservoir effect likely accounting for the mild intraductal dilatation. Tiny too small to characterize hypodensity in left hepatic lobe statistically consistent with a cyst is redemonstrated versus partial volume averaging of mild ductal ectasia. Pancreas: Atrophic without inflammation or mass. Top-normal pancreatic duct caliber at 3 mm. Spleen: Normal Adrenals/Urinary Tract: Stable appearance of the adrenal glands and kidneys with dominant water attenuating cyst off the interpolar aspect of the right kidney measuring 5.5 cm in diameter. No worrisome features. Smaller cysts are also noted on the right. No enhancing renal mass nephrolithiasis. No hydroureter. The urinary bladder is unremarkable. Stomach/Bowel: Partial gastrectomy is redemonstrated as well as left hemicolectomy. Sigmoid anastomotic sutures are redemonstrated. Dilatation of the colon proximal to the sutures is chronic in appearance. No mechanical bowel obstruction or inflammation. Mild fluid-filled distention of small bowel loops in the left hemiabdomen with slight  transmural thickening may reflect mild enteritis. Vascular/Lymphatic: Aortoiliac atherosclerosis. No adenopathy. Reproductive: Mildly enlarged prostate. Seminal vesicles are unremarkable. Other: A few surgical clips project over the ventral right upper quadrant of the abdomen. Fat is noted within both inguinal canals. Musculoskeletal: Lumbarized S1. No acute nor suspicious osseous abnormality. No aggressive osseous lesions. IMPRESSION: 1. Mild fluid-filled distention of small bowel loops in the left hemiabdomen with slight transmural thickening may reflect a mild enteritis or small bowel dysmotility. No definite evidence of mechanical bowel obstruction on present study. 2. Status post partial gastrectomy and left hemicolectomy. 3. Stable right renal cysts, the largest measuring 5.5 cm in diameter. 4. Aortoiliac atherosclerosis. Electronically Signed   By: Ashley Royalty M.D.   On: 05/10/2018 19:07    Procedures Procedures (including critical care time)  Medications Ordered in ED Medications  0.9 %  sodium chloride infusion ( Intravenous New Bag/Given 05/10/18 1732)  ondansetron (ZOFRAN) injection 4 mg (has no administration in time range)  sodium chloride flush (NS) 0.9 % injection 3 mL (3 mLs Intravenous Given 05/10/18 1735)  sodium chloride 0.9 % bolus 500 mL (0 mLs Intravenous Stopped 05/10/18 1726)  ondansetron (ZOFRAN) injection 4 mg (4 mg Intravenous Given 05/10/18 1618)  HYDROmorphone (DILAUDID) injection 1 mg (1 mg Intravenous Given 05/10/18 1733)  iohexol (OMNIPAQUE) 300 MG/ML solution 75 mL (75 mLs Intravenous Contrast Given 05/10/18 1823)     Initial Impression / Assessment and Plan / ED Course  I have reviewed the triage vital signs and the nursing notes.  Pertinent labs & imaging results that were available during my care of the patient were reviewed by me and considered in my medical decision making (see chart for details).        Work-up  to include labs and CT scan of the abdomen.   CT scan of the abdomen showed a little bit of dilatation of small bowel but no evidence of any partial or complete bowel obstruction.  Patient will be treated with some hydrocodone for the pain.  Patient is followed by primary care doctor in [place].  Patient does receive regular prescription of tramadol.  But not enough to help this pain.  Also will give patient a prescription for Zofran.  Labs without any significant abnormalities.  Patient given his precautions.  Certainly is a set up for small bowel obstruction.  He will return for any new or worse symptoms.  Final Clinical Impressions(s) / ED Diagnoses   Final diagnoses:  Generalized abdominal pain    ED Discharge Orders         Ordered    HYDROcodone-acetaminophen (NORCO/VICODIN) 5-325 MG tablet  Every 4 hours PRN     05/10/18 2017           Fredia Sorrow, MD 05/10/18 2025

## 2018-05-22 ENCOUNTER — Other Ambulatory Visit (HOSPITAL_COMMUNITY): Payer: Self-pay | Admitting: Nurse Practitioner

## 2018-05-22 DIAGNOSIS — C189 Malignant neoplasm of colon, unspecified: Secondary | ICD-10-CM

## 2018-06-19 ENCOUNTER — Other Ambulatory Visit (HOSPITAL_COMMUNITY): Payer: Self-pay | Admitting: Nurse Practitioner

## 2018-06-19 DIAGNOSIS — C189 Malignant neoplasm of colon, unspecified: Secondary | ICD-10-CM

## 2018-06-21 ENCOUNTER — Other Ambulatory Visit (HOSPITAL_COMMUNITY): Payer: Self-pay | Admitting: Hematology

## 2018-06-21 DIAGNOSIS — C189 Malignant neoplasm of colon, unspecified: Secondary | ICD-10-CM

## 2018-06-21 DIAGNOSIS — R630 Anorexia: Secondary | ICD-10-CM

## 2018-07-21 ENCOUNTER — Other Ambulatory Visit (HOSPITAL_COMMUNITY): Payer: Self-pay | Admitting: Nurse Practitioner

## 2018-07-21 DIAGNOSIS — C189 Malignant neoplasm of colon, unspecified: Secondary | ICD-10-CM

## 2018-08-16 ENCOUNTER — Other Ambulatory Visit: Payer: Self-pay

## 2018-08-16 ENCOUNTER — Inpatient Hospital Stay (HOSPITAL_COMMUNITY): Payer: Medicare Other | Attending: Hematology

## 2018-08-16 DIAGNOSIS — D509 Iron deficiency anemia, unspecified: Secondary | ICD-10-CM | POA: Diagnosis not present

## 2018-08-16 DIAGNOSIS — Z85038 Personal history of other malignant neoplasm of large intestine: Secondary | ICD-10-CM | POA: Diagnosis present

## 2018-08-16 DIAGNOSIS — C189 Malignant neoplasm of colon, unspecified: Secondary | ICD-10-CM

## 2018-08-16 LAB — IRON AND TIBC
Iron: 87 ug/dL (ref 45–182)
Saturation Ratios: 30 % (ref 17.9–39.5)
TIBC: 292 ug/dL (ref 250–450)
UIBC: 205 ug/dL

## 2018-08-16 LAB — COMPREHENSIVE METABOLIC PANEL
ALT: 25 U/L (ref 0–44)
AST: 24 U/L (ref 15–41)
Albumin: 3.4 g/dL — ABNORMAL LOW (ref 3.5–5.0)
Alkaline Phosphatase: 88 U/L (ref 38–126)
Anion gap: 11 (ref 5–15)
BUN: 13 mg/dL (ref 8–23)
CO2: 20 mmol/L — ABNORMAL LOW (ref 22–32)
Calcium: 8 mg/dL — ABNORMAL LOW (ref 8.9–10.3)
Chloride: 109 mmol/L (ref 98–111)
Creatinine, Ser: 1.46 mg/dL — ABNORMAL HIGH (ref 0.61–1.24)
GFR calc Af Amer: 57 mL/min — ABNORMAL LOW (ref >60)
GFR calc non Af Amer: 49 mL/min — ABNORMAL LOW (ref >60)
Glucose, Bld: 105 mg/dL — ABNORMAL HIGH (ref 70–99)
Potassium: 3.7 mmol/L (ref 3.5–5.1)
Sodium: 140 mmol/L (ref 135–145)
Total Bilirubin: 0.5 mg/dL (ref 0.3–1.2)
Total Protein: 6 g/dL — ABNORMAL LOW (ref 6.5–8.1)

## 2018-08-16 LAB — CBC WITH DIFFERENTIAL/PLATELET
Abs Immature Granulocytes: 0.03 10*3/uL (ref 0.00–0.07)
Basophils Absolute: 0 10*3/uL (ref 0.0–0.1)
Basophils Relative: 0 %
Eosinophils Absolute: 0.2 10*3/uL (ref 0.0–0.5)
Eosinophils Relative: 2 %
HCT: 42.6 % (ref 39.0–52.0)
Hemoglobin: 13.8 g/dL (ref 13.0–17.0)
Immature Granulocytes: 0 %
Lymphocytes Relative: 28 %
Lymphs Abs: 2.3 10*3/uL (ref 0.7–4.0)
MCH: 30.6 pg (ref 26.0–34.0)
MCHC: 32.4 g/dL (ref 30.0–36.0)
MCV: 94.5 fL (ref 80.0–100.0)
Monocytes Absolute: 0.9 10*3/uL (ref 0.1–1.0)
Monocytes Relative: 11 %
Neutro Abs: 4.6 10*3/uL (ref 1.7–7.7)
Neutrophils Relative %: 59 %
Platelets: 119 10*3/uL — ABNORMAL LOW (ref 150–400)
RBC: 4.51 MIL/uL (ref 4.22–5.81)
RDW: 14 % (ref 11.5–15.5)
WBC: 8 10*3/uL (ref 4.0–10.5)
nRBC: 0 % (ref 0.0–0.2)

## 2018-08-16 LAB — LACTATE DEHYDROGENASE: LDH: 109 U/L (ref 98–192)

## 2018-08-16 LAB — VITAMIN B12: Vitamin B-12: 279 pg/mL (ref 180–914)

## 2018-08-16 LAB — FERRITIN: Ferritin: 24 ng/mL (ref 24–336)

## 2018-08-16 LAB — FOLATE: Folate: 50.8 ng/mL (ref >5.9)

## 2018-08-16 MED ORDER — SODIUM CHLORIDE 0.9% FLUSH
20.0000 mL | Freq: Once | INTRAVENOUS | Status: AC
  Administered 2018-08-16: 12:00:00 20 mL via INTRAVENOUS

## 2018-08-16 MED ORDER — HEPARIN SOD (PORK) LOCK FLUSH 100 UNIT/ML IV SOLN
500.0000 [IU] | Freq: Once | INTRAVENOUS | Status: AC
  Administered 2018-08-16: 500 [IU] via INTRAVENOUS

## 2018-08-16 NOTE — Patient Instructions (Signed)
[place] at [facility]  Discharge Instructions:  Port flushed and labs drawn today _______________________________________________________________  Thank you for choosing [place] at [facility] to provide your oncology and hematology care.  To afford each patient quality time with our providers, please arrive at least 15 minutes before your scheduled appointment.  You need to re-schedule your appointment if you arrive 10 or more minutes late.  We strive to give you quality time with our providers, and arriving late affects you and other patients whose appointments are after yours.  Also, if you no show three or more times for appointments you may be dismissed from the clinic.  Again, thank you for choosing [place] at [place] hope is that these requests will allow you access to exceptional care and in a timely manner. _______________________________________________________________  If you have questions after your visit, please contact our office at [phone] between the hours of 8:30 a.m. and 5:00 p.m. Voicemails left after 4:30 p.m. will not be returned until the following business day. _______________________________________________________________  For prescription refill requests, have your pharmacy contact our office. _______________________________________________________________  Recommendations made by the consultant and any test results will be sent to your referring physician. _______________________________________________________________

## 2018-08-16 NOTE — Progress Notes (Signed)
Port flushed and labs drawn without incident. Deaccessed and bandaid applied. Pt to follow up with Dr. Delton Coombes next week. Discharged self ambulatory in satisfactory condition.

## 2018-08-17 ENCOUNTER — Other Ambulatory Visit (HOSPITAL_COMMUNITY): Payer: Self-pay | Admitting: Hematology

## 2018-08-17 DIAGNOSIS — C189 Malignant neoplasm of colon, unspecified: Secondary | ICD-10-CM

## 2018-08-17 DIAGNOSIS — R630 Anorexia: Secondary | ICD-10-CM

## 2018-08-17 LAB — CEA: CEA: 2.9 ng/mL (ref 0.0–4.7)

## 2018-08-22 ENCOUNTER — Other Ambulatory Visit (HOSPITAL_COMMUNITY): Payer: Self-pay | Admitting: Nurse Practitioner

## 2018-08-22 DIAGNOSIS — C189 Malignant neoplasm of colon, unspecified: Secondary | ICD-10-CM

## 2018-08-23 ENCOUNTER — Encounter (HOSPITAL_COMMUNITY): Payer: Self-pay | Admitting: Hematology

## 2018-08-23 ENCOUNTER — Other Ambulatory Visit: Payer: Self-pay

## 2018-08-23 ENCOUNTER — Inpatient Hospital Stay (HOSPITAL_COMMUNITY): Payer: Medicare Other | Attending: Hematology | Admitting: Hematology

## 2018-08-23 DIAGNOSIS — Z85038 Personal history of other malignant neoplasm of large intestine: Secondary | ICD-10-CM

## 2018-08-23 DIAGNOSIS — Z87891 Personal history of nicotine dependence: Secondary | ICD-10-CM | POA: Diagnosis not present

## 2018-08-23 DIAGNOSIS — G620 Drug-induced polyneuropathy: Secondary | ICD-10-CM

## 2018-08-23 DIAGNOSIS — E611 Iron deficiency: Secondary | ICD-10-CM

## 2018-08-23 DIAGNOSIS — C189 Malignant neoplasm of colon, unspecified: Secondary | ICD-10-CM

## 2018-08-23 NOTE — Addendum Note (Signed)
Addended by: Farley Ly on: 08/23/2018 06:15 PM   Modules accepted: Orders

## 2018-08-23 NOTE — Assessment & Plan Note (Addendum)
1.  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 -Last EGD/colonoscopy was in 05/15/2015 which was normal. -CT scan in October 2018 was negative for metastatic disease. -Denies any bleeding per rectum or melena.  Physical exam today was within normal limits. -We discussed the blood work.  CEA was within normal limits.   2.  Iron deficiency: -This is from combination of CKD and relative iron deficiency. -Last Feraheme on 09/05/2017 and 09/12/2017. -Hemoglobin today is 13.8, down from 15.4. -Ferritin has decreased to 24, from 145 6 months ago. - I have recommended 2 more infusions of Feraheme weekly.  I will see him back in 4 months with repeat blood work.  3.  Peripheral neuropathy: -He has neuropathy with tingling and numbness in the feet.  He also has pins-and-needles sensation in the feet. -He was started on gabapentin 100 mg at bedtime.

## 2018-08-23 NOTE — Progress Notes (Signed)
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 male for follow-up of his colon cancer and iron deficiency state.  He denies any bleeding per rectum or melena.  No new onset pains.  Denies any nausea, vomiting or worsening of diarrhea.  He feels more tired.  Numbness in the feet has been stable.  Appetite is 50% and energy levels are 25%.  He does have a baseline diarrhea which is unchanged.   REVIEW OF SYSTEMS:  Review of Systems  Gastrointestinal: Positive for diarrhea. Negative for nausea.  Neurological: Positive for headaches and numbness.  All other systems reviewed and are negative.    PAST MEDICAL/SURGICAL HISTORY:  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   Stage 3   Anemia    Anxiety    Arthritis    Barrett's esophagus    Blood transfusion    Bowel obstruction ([place]) 05/13/2012   Recurrent   Bronchitis    Chest pain at rest    Chronic abdominal pain    Erosive esophagitis    ETOH abuse    quit 03/2010   GERD (gastroesophageal reflux disease)    Hx of Clostridium difficile infection 01/2012   Hypertension    Ileus ([name])    Iron deficiency anemia 03/23/2016   Obstruction of bowel ([place]) 03/03/14   Osteoporosis    Personal history of PE (pulmonary embolism) 10/01/2010   Pneumonia    Pulmonary embolism ([name]) 02/2010   Recurrent upper respiratory infection (URI)    Renal disorder      S/P endoscopy September 28, 2010   erosive reflux esophagitis, Billroth I anatomy   S/P partial gastrectomy 1980s   Seizures ([name])    Shortness of breath    TIA (transient ischemic attack) 10/11   Vitamin B12 deficiency    Past Surgical History:  Procedure Laterality Date   ABDOMINAL ADHESION SURGERY  03/04/15   @ [facility]   ABDOMINAL EXPLORATION SURGERY     abdominal sugery     for bowel obstruction x 8, all in 1980s, except for one in 07/2010   APPENDECTOMY  1980s   Billroth 1 hemigastrectomy  1980s   per patient for benign duodenal tumor   CARDIAC CATHETERIZATION  07/17/2012   CHOLECYSTECTOMY  1980s   COLON SURGERY  May 2012   left hemicolectomy, colon cancer found at time of surgery for bowel obstruction   COLONOSCOPY  03/18/2011   anastomosis at 35cm. Several adenomatous polyps removed. Sigmoid diverticulosis. Next TCS 02/2013   COLONOSCOPY N/A 07/24/2012   [id number] post segmental resection with normal-appearing colonic anastomosis aside from an adjacent polyp-removed as described above. Rectal polyp-removed as described above. CT findings appear to have been artifactual. tubular adenomas/prolapsed type polyp.   COLONOSCOPY N/A 05/15/2015   Procedure: COLONOSCOPY;  Surgeon: [name], MD;  [facility];  Service: Endoscopy;  Laterality: N/A;   COLONOSCOPY WITH PROPOFOL N/A 11/04/2016   Dr. [name], status  post subtotal colectomy with normal-appearing residual lower GI tract.  Next colonoscopy in 5 years.   ESOPHAGOGASTRODUODENOSCOPY  09/28/2010   ESOPHAGOGASTRODUODENOSCOPY  12/01/2010   Cervical web status post dilation, erosive esophagitis, B1 hemigastrectomy, inflamed anastomosis   ESOPHAGOGASTRODUODENOSCOPY  04/16/2011   excoriation at [name] c/w trauma/M-W tear, friable gastric anastomosis, dilation efferent limb   ESOPHAGOGASTRODUODENOSCOPY N/A 06/03/2014   Dr.[name]- cervcal esopphageal web s/p dilation. abnormal distal esophagus bx= barretts esophagus    ESOPHAGOGASTRODUODENOSCOPY N/A 05/15/2015   Procedure: ESOPHAGOGASTRODUODENOSCOPY (EGD);  Surgeon: [name], MD;  [facility];  Service: Endoscopy;  Laterality: N/A;  230   ESOPHAGOGASTRODUODENOSCOPY (EGD) WITH ESOPHAGEAL DILATION  02/25/2012   [id number] esophageal web-s/p dilation anddisruption as described above. Status post prior gastric with Billroth I configuration. Abnormal gastric mucosa at the anastomosis. Gastric biopsy showed mild chronic inflammation but no H. pylori    ESOPHAGOGASTRODUODENOSCOPY (EGD) WITH PROPOFOL N/A 11/04/2016   Dr. [name]: Reflux esophagitis, small hiatal hernia status post hemigastrectomy.  Single patent efferent small bowel limb appeared normal, 2 x 2 centimeter tongue of salmon epithelium again seen, esophageal dilation.  Biopsies consistent with reflux changes, not Barrett's however this was confirmed on prior EGDs.  Offer 3-year follow-up EGD August 2021.   HERNIA REPAIR     right inguinal   MALONEY DILATION N/A 06/03/2014   Procedure: [name] DILATION;  Surgeon: [name], MD;  [facility];  Service: Endoscopy;  Laterality: N/A;   MALONEY DILATION N/A 05/15/2015   Procedure: [name] DILATION;  Surgeon: [name], MD;  [facility];  Service: Endoscopy;  Laterality: N/A;   MALONEY DILATION N/A 11/04/2016   Procedure: [name] DILATION;  Surgeon: [name], MD;  [facility];  Service: Endoscopy;  Laterality: N/A;   PORTACATH PLACEMENT     SAVORY DILATION N/A 06/03/2014   Procedure: SAVORY DILATION;  Surgeon: [name], MD;  [facility];  Service: Endoscopy;  Laterality: N/A;     SOCIAL HISTORY:  Social History   Socioeconomic History   Marital status: Married    Spouse name: Not on file   Number of children: 3   Years of education: Not on file   Highest education level: Not on file  Occupational History    Employer: [employer]  Social Needs    Financial resource strain: Not on file   Food insecurity:    Worry: Not on file    Inability: Not on file   Transportation needs:    Medical: Not on file    Non-medical: Not on file  Tobacco Use   Smoking status: Former Smoker    Packs/day: 0.50    Years: 40.00    Pack years: 20.00    Types: Cigarettes    Last attempt to quit: 12/20/2012    Years since quitting: 5.6   Smokeless tobacco: Never Used  Substance and Sexual Activity   Alcohol use: No   Drug use: No   Sexual activity: Never  Lifestyle   Physical activity:    Days per week: Not on file    Minutes per session: Not on file   Stress: Not on file  Relationships   Social connections:    Talks on phone: Not on file    Gets together: Not on file    Attends religious service: Not on file    Active member of club or organization: Not on file    Attends meetings of clubs or  organizations: Not on file    Relationship status: Not on file   Intimate partner violence:    Fear of current or ex partner: Not on file    Emotionally abused: Not on file    Physically abused: Not on file    Forced sexual activity: Not on file  Other Topics Concern   Not on file  Social History Narrative   Not on file    FAMILY HISTORY:  Family History  Problem Relation Age of Onset   Hypertension Mother    Arthritis Mother    Pneumonia Mother    Hypertension Father    Heart attack Father    Colon cancer Neg Hx     CURRENT MEDICATIONS:  Outpatient Encounter Medications as of 08/23/2018  Medication Sig   ammonium lactate (AMLACTIN) 12 % cream APPLY CREAM TO DRY SKIN ON LEGS ARMS HANDS AND BACK AREA ONCE TO TWICE DAILY (AVOID FACE GROIN UNDERARMS)   atorvastatin (LIPITOR) 20 MG tablet Take 20 mg by mouth at bedtime.    carvedilol (COREG) 25 MG tablet    Cholecalciferol (VITAMIN D3 ULTRA POTENCY) 1.25 MG (50000 UT) TABS    docusate sodium (COLACE) 100 MG capsule Take 200 mg by mouth daily as needed for mild  constipation.    enoxaparin (LOVENOX) 60 MG/0.6ML injection    gabapentin (NEURONTIN) 100 MG capsule Take 1 capsule by mouth at bedtime   lansoprazole (PREVACID) 30 MG capsule TAKE 1 CAPSULE BY MOUTH TWICE DAILY BEFORE A MEAL   lidocaine-prilocaine (EMLA) cream Apply 1 application topically as needed (prior to accessing port).   LORazepam (ATIVAN) 1 MG tablet Take 1 tablet (1 mg total) by mouth every 8 (eight) hours as needed for anxiety. (Patient taking differently: Take 1 mg by mouth daily. )   mirtazapine (REMERON) 30 MG tablet TAKE 1/2 (ONE-HALF) TABLET BY MOUTH AT BEDTIME FOR 7 DAYS THEN  INCREASE  TO  1  TABLET  BY  MOUTH  THEREAFTER   Multiple Vitamin (MULTIVITAMIN WITH MINERALS) TABS tablet Take 1 tablet by mouth daily.   Nutritional Supplements (ENSURE PLUS HN) LIQD Take 1 Bottle daily by mouth.    ondansetron (ZOFRAN ODT) 4 MG disintegrating tablet Take 1 tablet (4 mg total) by mouth every 8 (eight) hours as needed.   Pancrelipase, Lip-Prot-Amyl, 24000-76000 units CPEP Take 2 capsules with meals(breakfast, lunch, dinner) and one snack   polyethylene glycol (MIRALAX / GLYCOLAX) packet Take 17 g by mouth daily. (Patient taking differently: Take 17 g by mouth daily as needed for mild constipation. )   Probiotic Product (PROBIOTIC PO) Take 1 tablet by mouth daily.   prochlorperazine (COMPAZINE) 10 MG tablet Take 10 mg by mouth every 6 (six) hours as needed for nausea or vomiting.    traMADol (ULTRAM) 50 MG tablet Take 2 tablets by mouth once every 6 hours as needed.   Vitamin D, Ergocalciferol, (DRISDOL) 50000 units CAPS capsule Take 50,000 Units by mouth every 7 (seven) days.    zoledronic acid (RECLAST) 5 MG/100ML SOLN injection Inject 5 mg into the vein See admin instructions. Pt gets a dose yearly. Due again 2018   [DISCONTINUED] ondansetron (ZOFRAN ODT) 4 MG disintegrating tablet Take 1 tablet (4 mg total) by mouth every 8 (eight) hours as needed for nausea or vomiting.     Cholecalciferol (VITAMIN D) 2000 units CAPS Take 2,000 Units by mouth daily.   HYDROcodone-acetaminophen (NORCO/VICODIN) 5-325 MG tablet Take 2 tablets by mouth every 4 (four) hours as needed. (Patient  not taking: Reported on 08/23/2018)   [DISCONTINUED] azithromycin (ZITHROMAX) 250 MG tablet TAKE 2 TABLETS BY MOUTH ON DAY 1 AND THEN TAKE 1 TABLET BY MOUTH ONCE A DAY ON DAY 2 THROUGH DAY 5   Facility-Administered Encounter Medications as of 08/23/2018  Medication   heparin lock flush 100 unit/mL   sodium chloride flush (NS) 0.9 % injection 10 mL    ALLERGIES:  No Known Allergies   PHYSICAL EXAM:  ECOG Performance status: 1  Vitals:   08/23/18 1118  BP: (!) 157/83  Pulse: 78  Resp: 18  Temp: 98.9 F (37.2 C)  SpO2: 97%   Filed Weights   08/23/18 1118  Weight: 157 lb (71.2 kg)    Physical Exam Constitutional:      Appearance: He is well-developed.  Musculoskeletal: Normal range of motion.  Skin:    General: Skin is warm and dry.  Neurological:     Mental Status: He is alert and oriented to person, place, and time.  Psychiatric:        Behavior: Behavior normal.        Thought Content: Thought content normal.        Judgment: Judgment normal.      LABORATORY DATA:  I have reviewed the labs as listed.  CBC    Component Value Date/Time   WBC 8.0 08/16/2018 1124   RBC 4.51 08/16/2018 1124   HGB 13.8 08/16/2018 1124   HCT 42.6 08/16/2018 1124   PLT 119 (L) 08/16/2018 1124   MCV 94.5 08/16/2018 1124   MCH 30.6 08/16/2018 1124   MCHC 32.4 08/16/2018 1124   RDW 14.0 08/16/2018 1124   LYMPHSABS 2.3 08/16/2018 1124   MONOABS 0.9 08/16/2018 1124   EOSABS 0.2 08/16/2018 1124   BASOSABS 0.0 08/16/2018 1124   CMP Latest Ref Rng & Units 08/16/2018 05/10/2018 04/01/2018  Glucose 70 - 99 mg/dL 105(H) 100(H) 102(H)  BUN 8 - 23 mg/dL 13 13 16   Creatinine 0.61 - 1.24 mg/dL 1.46(H) 1.55(H) 1.21  Sodium 135 - 145 mmol/L 140 139 137  Potassium 3.5 - 5.1 mmol/L 3.7 4.8  3.8  Chloride 98 - 111 mmol/L 109 107 109  CO2 22 - 32 mmol/L 20(L) 23 19(L)  Calcium 8.9 - 10.3 mg/dL 8.0(L) 9.1 8.4(L)  Total Protein 6.5 - 8.1 g/dL 6.0(L) 6.8 6.7  Total Bilirubin 0.3 - 1.2 mg/dL 0.5 0.8 0.8  Alkaline Phos 38 - 126 U/L 88 73 83  AST 15 - 41 U/L 24 28 28   ALT 0 - 44 U/L 25 32 38       DIAGNOSTIC IMAGING:  I have independently reviewed the scans and discussed with the patient.      ASSESSMENT & PLAN:   Adenocarcinoma of colon with mucinous features 1.  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 -Last EGD/colonoscopy was in 05/15/2015 which was normal. -CT scan in October 2018 was negative for metastatic disease. -Denies any bleeding per rectum or melena.  Physical exam today was within normal limits. -We discussed the blood work.  CEA was within normal limits.   2.  Iron deficiency: -This is from combination of CKD and relative iron deficiency. -Last Feraheme on 09/05/2017 and 09/12/2017. -Hemoglobin today is 13.8, down from 15.4. -Ferritin has decreased to 24, from 145 6 months ago. - I have recommended 2 more  infusions of Feraheme weekly.  I will see him back in 4 months with repeat blood work.  3.  Peripheral neuropathy: -He has neuropathy with tingling and numbness in the feet.  He also has pins-and-needles sensation in the feet. -He was started on gabapentin 100 mg at bedtime.   Total time spent is 25 minutes with more than 50% of the time spent face-to-face discussing treatment plan and coordination of care.    Orders placed this encounter:  No orders of the defined types were placed in this encounter.     Derek Jack, MD [place] [phone]

## 2018-08-29 ENCOUNTER — Inpatient Hospital Stay (HOSPITAL_COMMUNITY): Payer: Medicare Other

## 2018-08-29 ENCOUNTER — Other Ambulatory Visit: Payer: Self-pay

## 2018-08-29 ENCOUNTER — Encounter (HOSPITAL_COMMUNITY): Payer: Self-pay

## 2018-08-29 VITALS — BP 120/79 | HR 86 | Temp 100.0°F | Resp 18

## 2018-08-29 DIAGNOSIS — D508 Other iron deficiency anemias: Secondary | ICD-10-CM

## 2018-08-29 DIAGNOSIS — Z85038 Personal history of other malignant neoplasm of large intestine: Secondary | ICD-10-CM | POA: Diagnosis not present

## 2018-08-29 MED ORDER — SODIUM CHLORIDE 0.9 % IV SOLN
510.0000 mg | Freq: Once | INTRAVENOUS | Status: AC
  Administered 2018-08-29: 510 mg via INTRAVENOUS
  Filled 2018-08-29: qty 510

## 2018-08-29 MED ORDER — SODIUM CHLORIDE 0.9% FLUSH
10.0000 mL | INTRAVENOUS | Status: DC | PRN
  Administered 2018-08-29: 14:00:00 10 mL via INTRAVENOUS
  Filled 2018-08-29: qty 10

## 2018-08-29 MED ORDER — SODIUM CHLORIDE 0.9 % IV SOLN
INTRAVENOUS | Status: DC
  Administered 2018-08-29: 14:00:00 via INTRAVENOUS

## 2018-08-29 MED ORDER — HEPARIN SOD (PORK) LOCK FLUSH 100 UNIT/ML IV SOLN
500.0000 [IU] | Freq: Once | INTRAVENOUS | Status: AC
  Administered 2018-08-29: 15:00:00 500 [IU] via INTRAVENOUS

## 2018-08-29 NOTE — Patient Instructions (Signed)
[facility] at [facility] Discharge Instructions  Received Feraheme infusion today. Follow-up as scheduled. Call clinic for any questions or concerns   Thank you for choosing [facility] at [facility] to provide your oncology and hematology care.  To afford each patient quality time with our provider, please arrive at least 15 minutes before your scheduled appointment time.   If you have a lab appointment with the Cancer Center please come in thru the  Main Entrance and check in at the main information desk  You need to re-schedule your appointment should you arrive 10 or more minutes late.  We strive to give you quality time with our providers, and arriving late affects you and other patients whose appointments are after yours.  Also, if you no show three or more times for appointments you may be dismissed from the clinic at the providers discretion.     Again, thank you for choosing [facility].  Our hope is that these requests will decrease the amount of time that you wait before being seen by our physicians.       _____________________________________________________________  Should you have questions after your visit to [facility], please contact our office at [phone] between the hours of 8:00 a.m. and 4:30 p.m.  Voicemails left after 4:00 p.m. will not be returned until the following business day.  For prescription refill requests, have your pharmacy contact our office and allow 72 hours.    Cancer Center Support Programs:   > Cancer Support Group  2nd Tuesday of the month 1pm-2pm, Journey Room   

## 2018-08-29 NOTE — Progress Notes (Signed)
[name] tolerated Feraheme infusion well without complaints or incident. VSS upon discharge. Pt discharged self ambulatory in satisfactory condition

## 2018-09-05 ENCOUNTER — Inpatient Hospital Stay (HOSPITAL_COMMUNITY): Payer: Medicare Other

## 2018-09-05 ENCOUNTER — Other Ambulatory Visit: Payer: Self-pay

## 2018-09-05 ENCOUNTER — Encounter (HOSPITAL_COMMUNITY): Payer: Self-pay

## 2018-09-05 VITALS — BP 126/77 | HR 75 | Temp 98.2°F | Resp 16 | Wt 158.6 lb

## 2018-09-05 DIAGNOSIS — Z85038 Personal history of other malignant neoplasm of large intestine: Secondary | ICD-10-CM | POA: Diagnosis not present

## 2018-09-05 DIAGNOSIS — D508 Other iron deficiency anemias: Secondary | ICD-10-CM

## 2018-09-05 MED ORDER — SODIUM CHLORIDE 0.9 % IV SOLN
INTRAVENOUS | Status: DC
  Administered 2018-09-05: 14:00:00 via INTRAVENOUS

## 2018-09-05 MED ORDER — SODIUM CHLORIDE 0.9 % IV SOLN
510.0000 mg | Freq: Once | INTRAVENOUS | Status: AC
  Administered 2018-09-05: 510 mg via INTRAVENOUS
  Filled 2018-09-05: qty 510

## 2018-09-05 NOTE — Patient Instructions (Signed)
[facility] at [facility]  Discharge Instructions:   _______________________________________________________________  Thank you for choosing [facility] at [facility] to provide your oncology and hematology care.  To afford each patient quality time with our providers, please arrive at least 15 minutes before your scheduled appointment.  You need to re-schedule your appointment if you arrive 10 or more minutes late.  We strive to give you quality time with our providers, and arriving late affects you and other patients whose appointments are after yours.  Also, if you no show three or more times for appointments you may be dismissed from the clinic.  Again, thank you for choosing [facility] at [facility]. Our hope is that these requests will allow you access to exceptional care and in a timely manner. _______________________________________________________________  If you have questions after your visit, please contact our office at [phone] between the hours of 8:30 a.m. and 5:00 p.m. Voicemails left after 4:30 p.m. will not be returned until the following business day. _______________________________________________________________  For prescription refill requests, have your pharmacy contact our office. _______________________________________________________________  Recommendations made by the consultant and any test results will be sent to your referring physician. _______________________________________________________________ 

## 2018-09-05 NOTE — Progress Notes (Signed)
Feraheme given per orders. Patient tolerated it well without problems. Vitals stable and discharged home from clinic ambulatory. Follow up as scheduled.  

## 2018-09-21 ENCOUNTER — Other Ambulatory Visit (HOSPITAL_COMMUNITY): Payer: Self-pay | Admitting: Nurse Practitioner

## 2018-09-21 DIAGNOSIS — C189 Malignant neoplasm of colon, unspecified: Secondary | ICD-10-CM

## 2018-09-26 HISTORY — PX: COLONOSCOPY: SHX174

## 2018-10-12 HISTORY — PX: COLON SURGERY: SHX602

## 2018-10-26 MED ORDER — DEXTROSE 50 % IV SOLN
12.50 | INTRAVENOUS | Status: DC
Start: ? — End: 2018-10-26

## 2018-10-26 MED ORDER — LIDOCAINE HCL (PF) 1 % IJ SOLN
.50 | INTRAMUSCULAR | Status: DC
Start: ? — End: 2018-10-26

## 2018-10-26 MED ORDER — GLUCAGON HCL RDNA (DIAGNOSTIC) 1 MG IJ SOLR
1.00 | INTRAMUSCULAR | Status: DC
Start: ? — End: 2018-10-26

## 2018-10-26 MED ORDER — PHENOL 1.4 % MT LIQD
OROMUCOSAL | Status: DC
Start: ? — End: 2018-10-26

## 2018-10-26 MED ORDER — INSULIN REGULAR HUMAN 100 UNIT/ML IJ SOLN
0.00 | INTRAMUSCULAR | Status: DC
Start: 2018-10-26 — End: 2018-10-26

## 2018-10-26 MED ORDER — TRAMADOL HCL 50 MG PO TABS
50.00 | ORAL_TABLET | ORAL | Status: DC
Start: ? — End: 2018-10-26

## 2018-10-26 MED ORDER — PROMETHAZINE HCL 12.5 MG PO TABS
12.50 | ORAL_TABLET | ORAL | Status: DC
Start: ? — End: 2018-10-26

## 2018-10-26 MED ORDER — ENOXAPARIN SODIUM 60 MG/0.6ML ~~LOC~~ SOLN
60.00 | SUBCUTANEOUS | Status: DC
Start: 2018-10-26 — End: 2018-10-26

## 2018-10-26 MED ORDER — BENZOCAINE-MENTHOL 15-2.6 MG MT LOZG
1.00 | LOZENGE | OROMUCOSAL | Status: DC
Start: ? — End: 2018-10-26

## 2018-10-26 MED ORDER — CARVEDILOL 12.5 MG PO TABS
25.00 | ORAL_TABLET | ORAL | Status: DC
Start: 2018-10-25 — End: 2018-10-26

## 2018-10-26 MED ORDER — ACETAMINOPHEN 325 MG PO TABS
650.00 | ORAL_TABLET | ORAL | Status: DC
Start: 2018-10-26 — End: 2018-10-26

## 2018-10-26 MED ORDER — ONDANSETRON 4 MG PO TBDP
4.00 | ORAL_TABLET | ORAL | Status: DC
Start: ? — End: 2018-10-26

## 2018-10-26 MED ORDER — MELATONIN 3 MG PO TABS
3.00 | ORAL_TABLET | ORAL | Status: DC
Start: 2018-10-25 — End: 2018-10-26

## 2018-10-26 MED ORDER — GENERIC EXTERNAL MEDICATION
10.00 | Status: DC
Start: ? — End: 2018-10-26

## 2018-10-27 ENCOUNTER — Inpatient Hospital Stay (HOSPITAL_COMMUNITY): Payer: Medicare Other | Attending: Hematology

## 2018-10-27 ENCOUNTER — Other Ambulatory Visit: Payer: Self-pay

## 2018-10-27 DIAGNOSIS — Z87891 Personal history of nicotine dependence: Secondary | ICD-10-CM | POA: Insufficient documentation

## 2018-10-27 DIAGNOSIS — Z452 Encounter for adjustment and management of vascular access device: Secondary | ICD-10-CM | POA: Insufficient documentation

## 2018-10-27 DIAGNOSIS — Z85038 Personal history of other malignant neoplasm of large intestine: Secondary | ICD-10-CM | POA: Insufficient documentation

## 2018-10-27 DIAGNOSIS — G620 Drug-induced polyneuropathy: Secondary | ICD-10-CM | POA: Insufficient documentation

## 2018-10-27 DIAGNOSIS — E611 Iron deficiency: Secondary | ICD-10-CM | POA: Insufficient documentation

## 2018-10-27 NOTE — Progress Notes (Signed)
Patient here for removal of port needle. Patient stated he got flushed on Wednesday of this week. Removed port needle per protocol.  TPatient tolerated it well without problems.  Discharged home from clinic ambulatory. Follow up as scheduled.

## 2018-11-10 ENCOUNTER — Other Ambulatory Visit (HOSPITAL_COMMUNITY): Payer: Self-pay | Admitting: Hematology

## 2018-11-10 DIAGNOSIS — R630 Anorexia: Secondary | ICD-10-CM

## 2018-11-10 DIAGNOSIS — C189 Malignant neoplasm of colon, unspecified: Secondary | ICD-10-CM

## 2018-11-15 ENCOUNTER — Other Ambulatory Visit (HOSPITAL_COMMUNITY): Payer: Self-pay | Admitting: Nurse Practitioner

## 2018-11-15 DIAGNOSIS — C189 Malignant neoplasm of colon, unspecified: Secondary | ICD-10-CM

## 2018-11-16 ENCOUNTER — Other Ambulatory Visit: Payer: Self-pay

## 2018-11-16 ENCOUNTER — Inpatient Hospital Stay (HOSPITAL_COMMUNITY): Payer: Medicare Other

## 2018-11-16 DIAGNOSIS — Z87891 Personal history of nicotine dependence: Secondary | ICD-10-CM | POA: Diagnosis not present

## 2018-11-16 DIAGNOSIS — E611 Iron deficiency: Secondary | ICD-10-CM | POA: Diagnosis not present

## 2018-11-16 DIAGNOSIS — G620 Drug-induced polyneuropathy: Secondary | ICD-10-CM | POA: Diagnosis not present

## 2018-11-16 DIAGNOSIS — Z85038 Personal history of other malignant neoplasm of large intestine: Secondary | ICD-10-CM | POA: Diagnosis present

## 2018-11-16 DIAGNOSIS — C189 Malignant neoplasm of colon, unspecified: Secondary | ICD-10-CM

## 2018-11-16 DIAGNOSIS — Z452 Encounter for adjustment and management of vascular access device: Secondary | ICD-10-CM | POA: Diagnosis present

## 2018-11-16 LAB — CBC WITH DIFFERENTIAL/PLATELET
Abs Immature Granulocytes: 0.06 10*3/uL (ref 0.00–0.07)
Basophils Absolute: 0 10*3/uL (ref 0.0–0.1)
Basophils Relative: 0 %
Eosinophils Absolute: 0.2 10*3/uL (ref 0.0–0.5)
Eosinophils Relative: 3 %
HCT: 42.4 % (ref 39.0–52.0)
Hemoglobin: 13.4 g/dL (ref 13.0–17.0)
Immature Granulocytes: 1 %
Lymphocytes Relative: 29 %
Lymphs Abs: 2.7 10*3/uL (ref 0.7–4.0)
MCH: 31.2 pg (ref 26.0–34.0)
MCHC: 31.6 g/dL (ref 30.0–36.0)
MCV: 98.6 fL (ref 80.0–100.0)
Monocytes Absolute: 0.7 10*3/uL (ref 0.1–1.0)
Monocytes Relative: 8 %
Neutro Abs: 5.5 10*3/uL (ref 1.7–7.7)
Neutrophils Relative %: 59 %
Platelets: 175 10*3/uL (ref 150–400)
RBC: 4.3 MIL/uL (ref 4.22–5.81)
RDW: 14.4 % (ref 11.5–15.5)
WBC: 9.2 10*3/uL (ref 4.0–10.5)
nRBC: 0 % (ref 0.0–0.2)

## 2018-11-16 LAB — COMPREHENSIVE METABOLIC PANEL
ALT: 19 U/L (ref 0–44)
AST: 19 U/L (ref 15–41)
Albumin: 3 g/dL — ABNORMAL LOW (ref 3.5–5.0)
Alkaline Phosphatase: 103 U/L (ref 38–126)
Anion gap: 7 (ref 5–15)
BUN: 7 mg/dL — ABNORMAL LOW (ref 8–23)
CO2: 20 mmol/L — ABNORMAL LOW (ref 22–32)
Calcium: 8.2 mg/dL — ABNORMAL LOW (ref 8.9–10.3)
Chloride: 109 mmol/L (ref 98–111)
Creatinine, Ser: 1.19 mg/dL (ref 0.61–1.24)
GFR calc Af Amer: >60 mL/min (ref >60)
GFR calc non Af Amer: >60 mL/min (ref >60)
Glucose, Bld: 105 mg/dL — ABNORMAL HIGH (ref 70–99)
Potassium: 4.7 mmol/L (ref 3.5–5.1)
Sodium: 136 mmol/L (ref 135–145)
Total Bilirubin: 0.3 mg/dL (ref 0.3–1.2)
Total Protein: 6.1 g/dL — ABNORMAL LOW (ref 6.5–8.1)

## 2018-11-16 LAB — IRON AND TIBC
Iron: 92 ug/dL (ref 45–182)
Saturation Ratios: 36 % (ref 17.9–39.5)
TIBC: 253 ug/dL (ref 250–450)
UIBC: 161 ug/dL

## 2018-11-16 LAB — FERRITIN: Ferritin: 334 ng/mL (ref 24–336)

## 2018-11-16 MED ORDER — HEPARIN SOD (PORK) LOCK FLUSH 100 UNIT/ML IV SOLN
500.0000 [IU] | Freq: Once | INTRAVENOUS | Status: AC
  Administered 2018-11-16: 500 [IU] via INTRAVENOUS

## 2018-11-16 MED ORDER — SODIUM CHLORIDE 0.9% FLUSH
20.0000 mL | Freq: Once | INTRAVENOUS | Status: AC
  Administered 2018-11-16: 20 mL via INTRAVENOUS

## 2018-11-16 NOTE — Progress Notes (Signed)
[name] presents today for portacath flush and labs. VSS. Left upper chest port accessed, labs drawn, and port flushed per protocol. See MAR and IV assessment for details. Port deaccessed and bandaid applied. Site clean and intact. Discharged in satisfactory condition with follow up instructions.

## 2018-11-16 NOTE — Patient Instructions (Signed)
[place] at [facility]  Discharge Instructions:  Port flushed and lab work drawn today for your appointment next week.  _______________________________________________________________  Thank you for choosing [place] at [facility] to provide your oncology and hematology care.  To afford each patient quality time with our providers, please arrive at least 15 minutes before your scheduled appointment.  You need to re-schedule your appointment if you arrive 10 or more minutes late.  We strive to give you quality time with our providers, and arriving late affects you and other patients whose appointments are after yours.  Also, if you no show three or more times for appointments you may be dismissed from the clinic.  Again, thank you for choosing [facility] [facility] [place] hope is that these requests will allow you access to exceptional care and in a timely manner. _______________________________________________________________  If you have questions after your visit, please contact our office at [phone] between the hours of 8:30 a.m. and 5:00 p.m. Voicemails left after 4:30 p.m. will not be returned until the following business day. _______________________________________________________________  For prescription refill requests, have your pharmacy contact our office. _______________________________________________________________  Recommendations made by the consultant and any test results will be sent to your referring physician. _______________________________________________________________

## 2018-11-17 LAB — CEA: CEA: 2.5 ng/mL (ref 0.0–4.7)

## 2018-11-23 ENCOUNTER — Encounter (HOSPITAL_COMMUNITY): Payer: Self-pay | Admitting: Hematology

## 2018-11-23 ENCOUNTER — Inpatient Hospital Stay (HOSPITAL_COMMUNITY): Payer: Medicare Other | Attending: Hematology | Admitting: Hematology

## 2018-11-23 ENCOUNTER — Other Ambulatory Visit: Payer: Self-pay

## 2018-11-23 VITALS — BP 142/89 | HR 79 | Temp 97.8°F | Resp 20 | Wt 155.0 lb

## 2018-11-23 DIAGNOSIS — Z85038 Personal history of other malignant neoplasm of large intestine: Secondary | ICD-10-CM | POA: Diagnosis present

## 2018-11-23 DIAGNOSIS — T451X5A Adverse effect of antineoplastic and immunosuppressive drugs, initial encounter: Secondary | ICD-10-CM | POA: Diagnosis not present

## 2018-11-23 DIAGNOSIS — D508 Other iron deficiency anemias: Secondary | ICD-10-CM | POA: Diagnosis not present

## 2018-11-23 DIAGNOSIS — N189 Chronic kidney disease, unspecified: Secondary | ICD-10-CM | POA: Diagnosis not present

## 2018-11-23 DIAGNOSIS — D509 Iron deficiency anemia, unspecified: Secondary | ICD-10-CM | POA: Insufficient documentation

## 2018-11-23 DIAGNOSIS — G620 Drug-induced polyneuropathy: Secondary | ICD-10-CM | POA: Insufficient documentation

## 2018-11-23 DIAGNOSIS — Z87891 Personal history of nicotine dependence: Secondary | ICD-10-CM | POA: Diagnosis not present

## 2018-11-23 DIAGNOSIS — C189 Malignant neoplasm of colon, unspecified: Secondary | ICD-10-CM | POA: Diagnosis not present

## 2018-11-23 NOTE — Patient Instructions (Addendum)
[facility] at [facility] Discharge Instructions  You were seen today by Dr. Katragadda. He went over your recent lab results. He will see you back in 6 months for labs and follow up.   Thank you for choosing [facility] at [facility] to provide your oncology and hematology care.  To afford each patient quality time with our provider, please arrive at least 15 minutes before your scheduled appointment time.   If you have a lab appointment with the Cancer Center please come in thru the  Main Entrance and check in at the main information desk  You need to re-schedule your appointment should you arrive 10 or more minutes late.  We strive to give you quality time with our providers, and arriving late affects you and other patients whose appointments are after yours.  Also, if you no show three or more times for appointments you may be dismissed from the clinic at the providers discretion.     Again, thank you for choosing [facility].  Our hope is that these requests will decrease the amount of time that you wait before being seen by our physicians.       _____________________________________________________________  Should you have questions after your visit to [facility], please contact our office at [phone] between the hours of 8:00 a.m. and 4:30 p.m.  Voicemails left after 4:00 p.m. will not be returned until the following business day.  For prescription refill requests, have your pharmacy contact our office and allow 72 hours.    Cancer Center Support Programs:   > Cancer Support Group  2nd Tuesday of the month 1pm-2pm, Journey Room    

## 2018-11-23 NOTE — Assessment & Plan Note (Signed)
1.  Stage III (T3N1) adenocarcinoma the left colon: - Status post surgical resection in May 2012, 1 out of 18 lymph nodes positive, could only complete 7 out of 12 FOLFOX chemotherapy secondary to neuropathy. -Last EGD/colonoscopy on 05/15/2015 was normal. - CT scan in October 2018 was negative for metastatic disease. - Reportedly was admitted to [facility] in July and had bowel resection done secondary to small bowel obstruction.  We reviewed his labs.  CEA is 2.5. - He will come back in 6 months for follow-up.  2.  Iron deficiency: -This is from combination of CKD and relative iron deficiency. -Last Feraheme on 08/29/2018 and 09/05/2018. - Hemoglobin is 13.4.  Ferritin is 334 and percent saturation is 35.  Creatinine is 1.19. - We will continue to monitor ferritin and iron panel.  3.  Peripheral neuropathy: - Neuropathy and the tingling and numbness in the feet. - He is taking gabapentin 100 mg at bedtime which is helping with pins-and-needles sensation.

## 2018-11-23 NOTE — Progress Notes (Signed)
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 male seen for follow-up of colon [name], iron deficiency anemia and peripheral neuropathy.  Denies any bleeding per rectum or melena.  Apparently was admitted to [facility] in July and underwent bowel resection.  He received Feraheme in June 2020.  Energy levels are 75%.  Appetite is 75%.  Neuropathy is well controlled with gabapentin.  Sleep problems are stable.  No new pains reported.  Denies any nausea, vomiting, diarrhea or constipation.  REVIEW OF SYSTEMS:  Review of Systems  Neurological: Positive for numbness.  Psychiatric/Behavioral: Positive for sleep disturbance.  All other systems reviewed and are negative.    PAST MEDICAL/SURGICAL HISTORY:  Past Medical History:  Diagnosis Date  . 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 Anemia   . Anxiety   . Arthritis   . Barrett's esophagus   . Blood transfusion   . Bowel obstruction ([place]) 05/13/2012   Recurrent  . Bronchitis   . Chest pain at rest   . Chronic abdominal pain   . Erosive esophagitis   . ETOH abuse    quit 03/2010  . GERD (gastroesophageal reflux disease)   . Hx of Clostridium difficile infection 01/2012  . Hypertension   . Ileus ([place])   . Iron deficiency anemia 03/23/2016  . Obstruction of bowel ([place]) 03/03/14  . Osteoporosis   . Personal history of PE (pulmonary embolism) 10/01/2010  . Pneumonia   . Pulmonary  embolism ([place]) 02/2010  . Recurrent upper respiratory infection (URI)   . Renal disorder   . S/P endoscopy September 28, 2010   erosive reflux esophagitis, Billroth I anatomy  . 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 Seizures ([place])   . Shortness of breath   . TIA (transient ischemic attack) 10/11  . Vitamin B12 deficiency    Past Surgical History:  Procedure Laterality Date  . ABDOMINAL ADHESION SURGERY  03/04/15   @ [facility]  . ABDOMINAL EXPLORATION SURGERY    . abdominal sugery     for bowel obstruction x 8, all in 1980s, except for one in 07/2010  . APPENDECTOMY  1980s  . Billroth 1 hemigastrectomy  1980s   per patient for benign duodenal tumor  . CARDIAC CATHETERIZATION  07/17/2012  . CHOLECYSTECTOMY  1980s  . COLON SURGERY  May 2012   left hemicolectomy, colon [name] found at time of surgery for bowel obstruction  . COLONOSCOPY  03/18/2011   anastomosis at 35cm. Several adenomatous polyps removed. Sigmoid diverticulosis. Next TCS 02/2013  . COLONOSCOPY N/A 07/24/2012   AOZ:HYQMVH post segmental resection with normal-appearing colonic anastomosis aside from an adjacent polyp-removed as described above. Rectal polyp-removed as described above. CT findings appear to have been artifactual. tubular adenomas/prolapsed type polyp.  . COLONOSCOPY N/A 05/15/2015   Procedure: COLONOSCOPY;  Surgeon: [name] Dolin, MD;  [facility];  Service: Endoscopy;  Laterality: N/A;  . COLONOSCOPY WITH PROPOFOL N/A 11/04/2016  Dr. Gala [name], status post subtotal colectomy with normal-appearing residual lower GI tract.  Next colonoscopy in 5 years.  . ESOPHAGOGASTRODUODENOSCOPY  09/28/2010  . ESOPHAGOGASTRODUODENOSCOPY  12/01/2010   Cervical web status post dilation, erosive esophagitis, B1 hemigastrectomy, inflamed anastomosis  . ESOPHAGOGASTRODUODENOSCOPY  04/16/2011   excoriation at GEJ c/w trauma/M-W tear, friable gastric anastomosis, dilation efferent limb  . ESOPHAGOGASTRODUODENOSCOPY N/A 06/03/2014    Dr.[name]- cervcal esopphageal web s/p dilation. abnormal distal esophagus bx= barretts esophagus  . ESOPHAGOGASTRODUODENOSCOPY N/A 05/15/2015   Procedure: ESOPHAGOGASTRODUODENOSCOPY (EGD);  Surgeon: [name] Dolin, MD;  [facility];  Service: Endoscopy;  Laterality: N/A;  230  . ESOPHAGOGASTRODUODENOSCOPY (EGD) WITH ESOPHAGEAL DILATION  02/25/2012   DGL:OVFIEPPI esophageal web-s/p dilation anddisruption as described above. Status post prior gastric with Billroth I configuration. Abnormal gastric mucosa at the anastomosis. Gastric biopsy showed mild chronic inflammation but no H. pylori   . ESOPHAGOGASTRODUODENOSCOPY (EGD) WITH PROPOFOL N/A 11/04/2016   Dr. Gala [name]: Reflux esophagitis, small hiatal hernia status post hemigastrectomy.  Single patent efferent small bowel limb appeared normal, 2 x 2 centimeter tongue of salmon epithelium again seen, esophageal dilation.  Biopsies consistent with reflux changes, not Barrett's however this was confirmed on prior EGDs.  Offer 3-year follow-up EGD August 2021.  [name] HERNIA REPAIR     right inguinal  . MALONEY DILATION N/A 06/03/2014   Procedure: [name] DILATION;  Surgeon: [name] Dolin, MD;  [facility];  Service: Endoscopy;  Laterality: N/A;  [name] DILATION N/A 05/15/2015   Procedure: [name] DILATION;  Surgeon: [name] Dolin, MD;  [facility];  Service: Endoscopy;  Laterality: N/A;  . [name] DILATION N/A 11/04/2016   Procedure: [name] DILATION;  Surgeon: [name] Dolin, MD;  [facility];  Service: Endoscopy;  Laterality: N/A;  . PORTACATH PLACEMENT    . SAVORY DILATION N/A 06/03/2014   Procedure: SAVORY DILATION;  Surgeon: [name] Dolin, MD;  [facility];  Service: Endoscopy;  Laterality: N/A;     SOCIAL HISTORY:  Social History   Socioeconomic History  . Marital status: Married    Spouse name: Not on file  . Number of children: 3  . Years of education: Not on file  . Highest education  level: Not on file  Occupational History    Employer: [employer]  Social Needs  . Financial resource strain: Not on file  . Food insecurity    Worry: Not on file    Inability: Not on file  . Transportation needs    Medical: Not on file    Non-medical: Not on file  Tobacco Use  . Smoking status: Former Smoker    Packs/day: 0.50    Years: 40.00    Pack years: 20.00    Types: Cigarettes    Quit date: 12/20/2012    Years since quitting: 5.9  . Smokeless tobacco: Never Used  Substance and Sexual Activity  . Alcohol use: No  . Drug use: No  . Sexual activity: Never  Lifestyle  . Physical activity    Days per week: Not on file    Minutes per session: Not on file  . Stress: Not on file  Relationships  . Social Herbalist on phone: Not on file    Gets together: Not on file    Attends religious service: Not on file    Active member of club or organization: Not on file    Attends meetings of clubs  or organizations: Not on file    Relationship status: Not on file  . Intimate partner violence    Fear of current or ex partner: Not on file    Emotionally abused: Not on file    Physically abused: Not on file    Forced sexual activity: Not on file  Other Topics Concern  . Not on file  Social History Narrative  . Not on file    FAMILY HISTORY:  Family History  Problem Relation Age of Onset  . Hypertension Mother   . Arthritis Mother   . Pneumonia Mother   . Hypertension Father   . Heart attack Father   . Colon [name] Neg Hx     CURRENT MEDICATIONS:  Outpatient Encounter Medications as of 11/23/2018  Medication Sig  . atorvastatin (LIPITOR) 20 MG tablet Take 20 mg by mouth at bedtime.   . carvedilol (COREG) 25 MG tablet   . Cholecalciferol (VITAMIN D) 2000 units CAPS Take 2,000 Units by mouth daily.  [name] enoxaparin (LOVENOX) 60 MG/0.6ML injection   . gabapentin (NEURONTIN) 100 MG capsule Take 1 capsule by mouth at bedtime  . lansoprazole (PREVACID) 30 MG  capsule TAKE 1 CAPSULE BY MOUTH TWICE DAILY BEFORE A MEAL  . mirtazapine (REMERON) 30 MG tablet TAKE 1/2 (ONE-HALF) TABLET BY MOUTH AT BEDTIME FOR 7 DAYS THEN  INCREASE  TO  1  TABLET  BY  MOUTH  THEREAFTER  . Multiple Vitamin (MULTIVITAMIN WITH MINERALS) TABS tablet Take 1 tablet by mouth daily.  . Nutritional Supplements (ENSURE PLUS HN) LIQD Take 1 Bottle daily by mouth.   . Probiotic Product (PROBIOTIC PO) Take 1 tablet by mouth daily.  . zoledronic acid (RECLAST) 5 MG/100ML SOLN injection Inject 5 mg into the vein See admin instructions. Pt gets a dose yearly. Due again 2018  . acetaminophen (TYLENOL) 325 MG tablet TAKE 3 TABLETS BY MOUTH EVERY 6 HOURS AS NEEDED FOR UP TO 10 DAYS  . ammonium lactate (AMLACTIN) 12 % cream APPLY CREAM TO DRY SKIN ON LEGS ARMS HANDS AND BACK AREA ONCE TO TWICE DAILY (AVOID FACE GROIN UNDERARMS)  . docusate sodium (COLACE) 100 MG capsule Take 200 mg by mouth daily as needed for mild constipation.   [name] HYDROcodone-acetaminophen (NORCO/VICODIN) 5-325 MG tablet Take 2 tablets by mouth every 4 (four) hours as needed. (Patient not taking: Reported on 08/23/2018)  . lidocaine-prilocaine (EMLA) cream Apply 1 application topically as needed (prior to accessing port). (Patient not taking: Reported on 11/23/2018)  . loperamide (IMODIUM) 2 MG capsule Take 1-2 tablets by mouth up to 4X/day as needed before meals.  Titrate amount based on stool consistency  . LORazepam (ATIVAN) 1 MG tablet Take 1 tablet (1 mg total) by mouth every 8 (eight) hours as needed for anxiety. (Patient not taking: Reported on 11/23/2018)  . ondansetron (ZOFRAN ODT) 4 MG disintegrating tablet Take 1 tablet (4 mg total) by mouth every 8 (eight) hours as needed. (Patient not taking: Reported on 11/23/2018)  . polyethylene glycol (MIRALAX / GLYCOLAX) packet Take 17 g by mouth daily. (Patient not taking: Reported on 11/23/2018)  . prochlorperazine (COMPAZINE) 10 MG tablet Take 10 mg by mouth every 6 (six) hours as  needed for nausea or vomiting.   . traMADol (ULTRAM) 50 MG tablet Take 2 tablets by mouth once every 6 hours as needed.  . [DISCONTINUED] Cholecalciferol (VITAMIN D3 ULTRA POTENCY) 1.25 MG (50000 UT) TABS   . [DISCONTINUED] oxyCODONE (OXY IR/ROXICODONE) 5 MG immediate release tablet   . [  DISCONTINUED] Pancrelipase, Lip-Prot-Amyl, 24000-76000 units CPEP Take 2 capsules with meals(breakfast, lunch, dinner) and one snack  . [DISCONTINUED] Vitamin D, Ergocalciferol, (DRISDOL) 50000 units CAPS capsule Take 50,000 Units by mouth every 7 (seven) days.    Facility-Administered Encounter Medications as of 11/23/2018  Medication  . heparin lock flush 100 unit/mL  . sodium chloride flush (NS) 0.9 % injection 10 mL    ALLERGIES:  No Known Allergies   PHYSICAL EXAM:  ECOG Performance status: 1  Vitals:   11/23/18 1206  BP: (!) 142/89  Pulse: 79  Resp: 20  Temp: 97.8 F (36.6 C)  SpO2: 99%   Filed Weights   11/23/18 1206  Weight: 155 lb (70.3 kg)    Physical Exam Constitutional:      Appearance: Normal appearance. He is well-developed.  Cardiovascular:     Rate and Rhythm: Normal rate and regular rhythm.     Heart sounds: Normal heart sounds.  Pulmonary:     Effort: Pulmonary effort is normal.     Breath sounds: Normal breath sounds.  Abdominal:     General: There is no distension.     Palpations: Abdomen is soft. There is no mass.  Musculoskeletal: Normal range of motion.  Skin:    General: Skin is warm and dry.  Neurological:     Mental Status: He is alert and oriented to person, place, and time.  Psychiatric:        Behavior: Behavior normal.        Thought Content: Thought content normal.        Judgment: Judgment normal.      LABORATORY DATA:  I have reviewed the labs as listed.  CBC    Component Value Date/Time   WBC 9.2 11/16/2018 1054   RBC 4.30 11/16/2018 1054   HGB 13.4 11/16/2018 1054   HCT 42.4 11/16/2018 1054   PLT 175 11/16/2018 1054   MCV 98.6  11/16/2018 1054   MCH 31.2 11/16/2018 1054   MCHC 31.6 11/16/2018 1054   RDW 14.4 11/16/2018 1054   LYMPHSABS 2.7 11/16/2018 1054   MONOABS 0.7 11/16/2018 1054   EOSABS 0.2 11/16/2018 1054   BASOSABS 0.0 11/16/2018 1054   CMP Latest Ref Rng & Units 11/16/2018 08/16/2018 05/10/2018  Glucose 70 - 99 mg/dL 105(H) 105(H) 100(H)  BUN 8 - 23 mg/dL 7(L) 13 13  Creatinine 0.61 - 1.24 mg/dL 1.19 1.46(H) 1.55(H)  Sodium 135 - 145 mmol/L 136 140 139  Potassium 3.5 - 5.1 mmol/L 4.7 3.7 4.8  Chloride 98 - 111 mmol/L 109 109 107  CO2 22 - 32 mmol/L 20(L) 20(L) 23  Calcium 8.9 - 10.3 mg/dL 8.2(L) 8.0(L) 9.1  Total Protein 6.5 - 8.1 g/dL 6.1(L) 6.0(L) 6.8  Total Bilirubin 0.3 - 1.2 mg/dL 0.3 0.5 0.8  Alkaline Phos 38 - 126 U/L 103 88 73  AST 15 - 41 U/L 19 24 28   ALT 0 - 44 U/L 19 25 32       DIAGNOSTIC IMAGING:  I have independently reviewed the scans and discussed with the patient.      ASSESSMENT & PLAN:   Adenocarcinoma of colon with mucinous features 1.  Stage III (T3N1) adenocarcinoma the left colon: - Status post surgical resection in May 2012, 1 out of 18 lymph nodes positive, could only complete 7 out of 12 FOLFOX chemotherapy secondary to neuropathy. -Last EGD/colonoscopy on 05/15/2015 was normal. - CT scan in October 2018 was negative for metastatic disease. - Reportedly was admitted to  [facility] in July and had bowel resection done secondary to small bowel obstruction.  We reviewed his labs.  CEA is 2.5. - He will come back in 6 months for follow-up.  2.  Iron deficiency: -This is from combination of CKD and relative iron deficiency. -Last Feraheme on 08/29/2018 and 09/05/2018. - Hemoglobin is 13.4.  Ferritin is 334 and percent saturation is 35.  Creatinine is 1.19. - We will continue to monitor ferritin and iron panel.  3.  Peripheral neuropathy: - Neuropathy and the tingling and numbness in the feet. - He is taking gabapentin 100 mg at bedtime which is helping  with pins-and-needles sensation.  Total time spent is 25 minutes with more than 50% of the time spent face-to-face discussing lab results, counseling and coordination of care.    Orders placed this encounter:  Orders Placed This Encounter  Procedures  . Iron and TIBC  . Ferritin  . CBC with Differential/Platelet  . Comprehensive metabolic panel  . CEA      Derek Jack, MD [place] [phone]

## 2018-12-19 ENCOUNTER — Other Ambulatory Visit: Payer: Self-pay | Admitting: Nurse Practitioner

## 2018-12-19 ENCOUNTER — Other Ambulatory Visit (HOSPITAL_COMMUNITY): Payer: Self-pay | Admitting: Nurse Practitioner

## 2018-12-19 DIAGNOSIS — C189 Malignant neoplasm of colon, unspecified: Secondary | ICD-10-CM

## 2019-01-15 ENCOUNTER — Other Ambulatory Visit (HOSPITAL_COMMUNITY): Payer: Self-pay | Admitting: Nurse Practitioner

## 2019-01-15 DIAGNOSIS — C189 Malignant neoplasm of colon, unspecified: Secondary | ICD-10-CM

## 2019-01-22 ENCOUNTER — Other Ambulatory Visit (HOSPITAL_COMMUNITY): Payer: Self-pay | Admitting: Nurse Practitioner

## 2019-01-22 DIAGNOSIS — C189 Malignant neoplasm of colon, unspecified: Secondary | ICD-10-CM

## 2019-01-23 ENCOUNTER — Other Ambulatory Visit (HOSPITAL_COMMUNITY): Payer: Self-pay | Admitting: *Deleted

## 2019-01-23 ENCOUNTER — Other Ambulatory Visit (HOSPITAL_COMMUNITY): Payer: Self-pay | Admitting: Nurse Practitioner

## 2019-01-23 DIAGNOSIS — C189 Malignant neoplasm of colon, unspecified: Secondary | ICD-10-CM

## 2019-01-23 MED ORDER — GABAPENTIN 100 MG PO CAPS: 100.0000 mg | ORAL_CAPSULE | Freq: Every day | ORAL | 0 refills | Status: DC

## 2019-01-25 ENCOUNTER — Other Ambulatory Visit (HOSPITAL_COMMUNITY): Payer: Self-pay | Admitting: Nurse Practitioner

## 2019-01-25 ENCOUNTER — Other Ambulatory Visit (HOSPITAL_COMMUNITY): Payer: Self-pay | Admitting: *Deleted

## 2019-01-25 DIAGNOSIS — C189 Malignant neoplasm of colon, unspecified: Secondary | ICD-10-CM

## 2019-01-25 MED ORDER — GABAPENTIN 100 MG PO CAPS: 100.0000 mg | ORAL_CAPSULE | Freq: Every day | ORAL | 0 refills | Status: DC

## 2019-02-22 ENCOUNTER — Inpatient Hospital Stay (HOSPITAL_COMMUNITY): Payer: Medicare Other | Attending: Hematology

## 2019-02-22 ENCOUNTER — Encounter (HOSPITAL_COMMUNITY): Payer: Self-pay

## 2019-02-22 ENCOUNTER — Other Ambulatory Visit: Payer: Self-pay

## 2019-02-22 VITALS — BP 119/74 | HR 76 | Temp 97.1°F | Resp 18

## 2019-02-22 DIAGNOSIS — Z85038 Personal history of other malignant neoplasm of large intestine: Secondary | ICD-10-CM | POA: Insufficient documentation

## 2019-02-22 DIAGNOSIS — Z452 Encounter for adjustment and management of vascular access device: Secondary | ICD-10-CM | POA: Insufficient documentation

## 2019-02-22 DIAGNOSIS — Z95828 Presence of other vascular implants and grafts: Secondary | ICD-10-CM

## 2019-02-22 MED ORDER — HEPARIN SOD (PORK) LOCK FLUSH 100 UNIT/ML IV SOLN
500.0000 [IU] | Freq: Once | INTRAVENOUS | Status: AC
  Administered 2019-02-22: 500 [IU] via INTRAVENOUS

## 2019-02-22 MED ORDER — SODIUM CHLORIDE 0.9% FLUSH
10.0000 mL | INTRAVENOUS | Status: DC | PRN
  Administered 2019-02-22: 12:00:00 10 mL via INTRAVENOUS
  Filled 2019-02-22: qty 10

## 2019-02-22 NOTE — Patient Instructions (Signed)
You were seen today for your port a cath to be flushed.  You will follow up in March for your next appt.  At that time we will draw lab work and you will see the provider.

## 2019-02-22 NOTE — Progress Notes (Signed)
[name] presented for Portacath access and flush.  Portacath located left chest wall accessed with  H 20 needle.  Good blood return present. Portacath flushed with 21ml NS and 500U/73ml Heparin and needle removed intact.  Procedure tolerated well and without incident.   Patient discharged ambulatory and in stable condition from clinic. Patient to follow up as previously scheduled.

## 2019-03-26 ENCOUNTER — Other Ambulatory Visit (HOSPITAL_COMMUNITY): Payer: Self-pay | Admitting: Nurse Practitioner

## 2019-03-26 DIAGNOSIS — C189 Malignant neoplasm of colon, unspecified: Secondary | ICD-10-CM

## 2019-04-25 ENCOUNTER — Ambulatory Visit (INDEPENDENT_AMBULATORY_CARE_PROVIDER_SITE_OTHER): Payer: Medicare Other | Admitting: Gastroenterology

## 2019-04-25 ENCOUNTER — Other Ambulatory Visit: Payer: Self-pay

## 2019-04-25 ENCOUNTER — Encounter: Payer: Self-pay | Admitting: Gastroenterology

## 2019-04-25 ENCOUNTER — Other Ambulatory Visit (HOSPITAL_COMMUNITY): Payer: Self-pay | Admitting: Nurse Practitioner

## 2019-04-25 VITALS — BP 143/82 | HR 73 | Temp 96.6°F | Ht 69.0 in | Wt 158.6 lb

## 2019-04-25 DIAGNOSIS — R197 Diarrhea, unspecified: Secondary | ICD-10-CM | POA: Diagnosis not present

## 2019-04-25 DIAGNOSIS — K2100 Gastro-esophageal reflux disease with esophagitis, without bleeding: Secondary | ICD-10-CM | POA: Diagnosis not present

## 2019-04-25 DIAGNOSIS — R110 Nausea: Secondary | ICD-10-CM | POA: Diagnosis not present

## 2019-04-25 DIAGNOSIS — R140 Abdominal distension (gaseous): Secondary | ICD-10-CM

## 2019-04-25 DIAGNOSIS — R6881 Early satiety: Secondary | ICD-10-CM

## 2019-04-25 DIAGNOSIS — R1012 Left upper quadrant pain: Secondary | ICD-10-CM

## 2019-04-25 DIAGNOSIS — C189 Malignant neoplasm of colon, unspecified: Secondary | ICD-10-CM

## 2019-04-25 MED ORDER — SUCRALFATE 1 GM/10ML PO SUSP: 1.0000 g | Freq: Four times a day (QID) | ORAL | 1 refills | Status: DC | PRN

## 2019-04-25 MED ORDER — PANCRELIPASE (LIP-PROT-AMYL) 36000-114000 UNITS PO CPEP: ORAL_CAPSULE | ORAL | 3 refills | Status: DC

## 2019-04-25 MED ORDER — GABAPENTIN 100 MG PO CAPS: 100.0000 mg | ORAL_CAPSULE | Freq: Every day | ORAL | 0 refills | Status: DC

## 2019-04-25 MED ORDER — PANTOPRAZOLE SODIUM 40 MG PO TBEC: 40.0000 mg | DELAYED_RELEASE_TABLET | Freq: Two times a day (BID) | ORAL | 3 refills | Status: DC

## 2019-04-25 NOTE — Progress Notes (Signed)
Cc'ed to pcp °

## 2019-04-25 NOTE — Progress Notes (Signed)
Primary Care Physician: [name], MD  Primary Gastroenterologist:  Garfield Cornea, MD   Chief Complaint  Patient presents with  . Diarrhea    gas,bloating,poor appetite,feels full quickly after eating a little bit    HPI: [name] is a 69 y.o. male here for follow-up of chronic diarrhea, bloating, gas, poor appetite.  He was last seen in October 2019.    Early satiety. Postprandial abdominal swelling. No appetite. Postprandial diarrhea or during meal. "severe diarrhea". Will take several bouts to get finished. No nocturnal stools. No melena, brbpr. Happens with snacks as well. Couple of Ensures daily, also diarrhea. LUQ pain postprandial. Feels like pressure or someone pressing on it. A lot of nausea. No vomiting. Regular heartburn on prevacid twice a day.  Creon seemed to help some. Was on for about 8 months. Stopped around the time of surgery. Recently started back but med had expired. Imodium 2 per each meal. Some helps.  Says his whole colon removed with last surgery. Total of 16 abdominal surgeries per patient.   Pertinent GI history: Previous work-up included negative celiac screen, negative stool studies.  He has a history of osteoporosis and B12 deficiency.  Status post cholecystectomy, hemigastrectomy in [employer] for benign tumor, numerous small bowel resections for abdominal adhesions and bowel obstruction, and subtotal colectomy for colon cancer.  EGD and colonoscopy in 2018 as outlined below.  Stool pancreatic elastase came back low at 28.  He was started on Creon 48,000 lipase units per meal and 24,000 lipase units per snack.  EGD in August 2018 with reflux esophagitis, small hiatal hernia status post hemigastrectomy.  Single patent efferent small bowel ileum appeared normal, 2 x 2 centimeter tongue of salmon epithelium again seen, esophageal dilation.  Biopsies consistent with reflux changes, not Barrett's however this was confirmed on prior EGDs,  patient will be offered EGD in August 2021.  Colonoscopy August 2018 status post subtotal colectomy with normal-appearing residual lower GI tract.  Next colonoscopy in 5 years.  2 recent admissions and [facility] back in July 2020.  First 1 for suspected small bowel obstruction.  He had a colonoscopy July 7 showing prior end-to-end colocolonic anastomosis in the rectosigmoid colon.  This was patent and was characterized by healthy-appearing mucosa.  The anastomosis was traversed.  The terminal ileum appeared normal.  Prep was inadequate to qualify for colorectal cancer surveillance.  Small bowel obstruction treated conservatively and patient was discharged.    Patient underwent resection of the right colon and terminal ileum with creation of ileal rectal anastomosis on October 12, 2018 for large bowel obstruction.  Work-up at admission revealed patent colorectal anastomosis with short segment of cecum and ascending colon attached to the rectum.  It was felt that he had a dysfunctional cecum causing incomplete emptying and was massively distended on CT.        Current Outpatient Medications  Medication Sig Dispense Refill  . acetaminophen (TYLENOL) 325 MG tablet TAKE 3 TABLETS BY MOUTH EVERY 6 HOURS AS NEEDED FOR UP TO 10 DAYS    . ammonium lactate (AMLACTIN) 12 % cream APPLY CREAM TO DRY SKIN ON LEGS ARMS HANDS AND BACK AREA ONCE TO TWICE DAILY (AVOID FACE GROIN UNDERARMS)  3  . atorvastatin (LIPITOR) 20 MG tablet Take 20 mg by mouth at bedtime.     . carvedilol (COREG) 25 MG tablet 2 (two) times daily.     . Cholecalciferol (VITAMIN D) 2000 units CAPS Take 2,000  Units by mouth daily.    [name] enoxaparin (LOVENOX) 60 MG/0.6ML injection     . gabapentin (NEURONTIN) 100 MG capsule Take 1 capsule (100 mg total) by mouth at bedtime. 30 capsule 0  . lansoprazole (PREVACID) 30 MG capsule TAKE 1 CAPSULE BY MOUTH TWICE DAILY BEFORE A MEAL 180 capsule 3  . lidocaine-prilocaine (EMLA) cream Apply 1 application  topically as needed (prior to accessing port). 30 g 1  . loperamide (IMODIUM) 2 MG capsule Take 1-2 tablets by mouth up to 4X/day as needed before meals.  Titrate amount based on stool consistency    . LORazepam (ATIVAN) 1 MG tablet Take 1 tablet (1 mg total) by mouth every 8 (eight) hours as needed for anxiety. 30 tablet 0  . mirtazapine (REMERON) 30 MG tablet TAKE 1/2 (ONE-HALF) TABLET BY MOUTH AT BEDTIME FOR 7 DAYS THEN  INCREASE  TO  1  TABLET  BY  MOUTH  THEREAFTER 30 tablet 6  . Multiple Vitamin (MULTIVITAMIN WITH MINERALS) TABS tablet Take 1 tablet by mouth daily.    . Nutritional Supplements (ENSURE PLUS HN) LIQD Take 1 Bottle daily by mouth.     . ondansetron (ZOFRAN ODT) 4 MG disintegrating tablet Take 1 tablet (4 mg total) by mouth every 8 (eight) hours as needed. 10 tablet 1  . Probiotic Product (PROBIOTIC PO) Take 1 tablet by mouth daily.    . prochlorperazine (COMPAZINE) 10 MG tablet Take 10 mg by mouth every 6 (six) hours as needed for nausea or vomiting.     . traMADol (ULTRAM) 50 MG tablet as needed.     . zoledronic acid (RECLAST) 5 MG/100ML SOLN injection Inject 5 mg into the vein See admin instructions. Pt gets a dose yearly. Due again 2018     No current facility-administered medications for this visit.   Facility-Administered Medications Ordered in Other Visits  Medication Dose Route Frequency Provider Last Rate Last Admin  . heparin lock flush 100 unit/mL  500 Units Intravenous Once Kefalas, Thomas S, PA-C      . sodium chloride flush (NS) 0.9 % injection 10 mL  10 mL Intravenous PRN Holley Bouche, NP   10 mL at 11/05/16 1136    Allergies as of 04/25/2019  . (No Known Allergies)   Past Medical History:  Diagnosis Date  . 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 Anemia   . Anxiety   . Arthritis   . Barrett's esophagus   . Blood transfusion   . Bowel obstruction ([employer]) 05/13/2012   Recurrent  . Bronchitis   . Chest pain at rest     . Chronic abdominal pain   . Erosive esophagitis   . ETOH abuse    quit 03/2010  . GERD (gastroesophageal reflux disease)   . Hx of Clostridium difficile infection 01/2012  . Hypertension   . Ileus ([place])   . Iron deficiency anemia 03/23/2016  . Obstruction of bowel ([place]) 03/03/14  . Osteoporosis   . Personal history of PE (pulmonary embolism) 10/01/2010  . Pneumonia   . Pulmonary embolism ([place]) 02/2010  . Recurrent upper respiratory infection (URI)   . Renal disorder   . S/P endoscopy September 28, 2010   erosive reflux esophagitis, Billroth I anatomy  . 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 Seizures ([place])   . Shortness of breath   . TIA (transient ischemic attack) 10/11  . Vitamin B12 deficiency    Past Surgical History:  Procedure  Laterality Date  . ABDOMINAL ADHESION SURGERY  03/04/15   @ [facility]  . ABDOMINAL EXPLORATION SURGERY    . abdominal sugery     for bowel obstruction x 8, all in 1980s, except for one in 07/2010  . APPENDECTOMY  1980s  . Billroth 1 hemigastrectomy  1980s   per patient for benign duodenal tumor  . CARDIAC CATHETERIZATION  07/17/2012  . CHOLECYSTECTOMY  1980s  . COLON SURGERY  May 2012   left hemicolectomy, colon cancer found at time of surgery for bowel obstruction  . COLON SURGERY  10/12/2018   At [facility]: Resection of right colon and terminal ileum with creation of ileorectal anastomosis for large bowel obstruction.  Cecum and ascending colon noted to be attached to the rectum.  . COLONOSCOPY  03/18/2011   anastomosis at 35cm. Several adenomatous polyps removed. Sigmoid diverticulosis. Next TCS 02/2013  . COLONOSCOPY N/A 07/24/2012   GGY:IRSWNI post segmental resection with normal-appearing colonic anastomosis aside from an adjacent polyp-removed as described above. Rectal polyp-removed as described above. CT findings appear to have been artifactual. tubular adenomas/prolapsed type polyp.  . COLONOSCOPY N/A 05/15/2015   Procedure: COLONOSCOPY;  Surgeon: [name], MD;  [facility]: [facility];  Service: Endoscopy;  Laterality: N/A;  . COLONOSCOPY  09/26/2018   at [facility]: prep not adequate for colon cancer surveillance.  Prior end-to-end colonic anastomosis in the rectosigmoid region.  This was patent and characterized by healthy-appearing mucosa.  Anastomosis was traversed.  Normal terminal ileum.  . COLONOSCOPY WITH PROPOFOL N/A 11/04/2016   Dr. [name], status post subtotal colectomy with normal-appearing residual lower GI tract.  Next colonoscopy in 5 years.  . ESOPHAGOGASTRODUODENOSCOPY  09/28/2010  . ESOPHAGOGASTRODUODENOSCOPY  12/01/2010   Cervical web status post dilation, erosive esophagitis, B1 hemigastrectomy, inflamed anastomosis  . ESOPHAGOGASTRODUODENOSCOPY  04/16/2011   excoriation at GEJ c/w trauma/M-W tear, friable gastric anastomosis, dilation efferent limb  . ESOPHAGOGASTRODUODENOSCOPY N/A 06/03/2014   Dr.[name]- cervcal esopphageal web s/p dilation. abnormal distal esophagus bx= barretts esophagus  . ESOPHAGOGASTRODUODENOSCOPY N/A 05/15/2015   Procedure: ESOPHAGOGASTRODUODENOSCOPY (EGD);  Surgeon: [name], MD;  [facility];  Service: Endoscopy;  Laterality: N/A;  230  . ESOPHAGOGASTRODUODENOSCOPY (EGD) WITH ESOPHAGEAL DILATION  02/25/2012   OEV:[id number] esophageal web-s/p dilation anddisruption as described above. Status post prior gastric with Billroth I configuration. Abnormal gastric mucosa at the anastomosis. Gastric biopsy showed mild chronic inflammation but no H. pylori   . ESOPHAGOGASTRODUODENOSCOPY (EGD) WITH PROPOFOL N/A 11/04/2016   Dr. [name]: Reflux esophagitis, small hiatal hernia status post hemigastrectomy.  Single patent efferent small bowel limb appeared normal, 2 x 2 centimeter tongue of salmon epithelium again seen, esophageal dilation.  Biopsies consistent with reflux changes, not Barrett's however this was confirmed on prior EGDs.  Offer 3-year follow-up EGD August 2021.  [name] HERNIA REPAIR     right inguinal   . MALONEY DILATION N/A 06/03/2014   Procedure: [name] DILATION;  Surgeon: [name], MD;  [facility];  Service: Endoscopy;  Laterality: N/A;  [name] DILATION N/A 05/15/2015   Procedure: [name] DILATION;  Surgeon: [name], MD;  [facility];  Service: Endoscopy;  Laterality: N/A;  . [name] DILATION N/A 11/04/2016   Procedure: [name] DILATION;  Surgeon: [name], MD;  [facility];  Service: Endoscopy;  Laterality: N/A;  . PORTACATH PLACEMENT    . SAVORY DILATION N/A 06/03/2014   Procedure: SAVORY DILATION;  Surgeon: [name], MD;  [facility];  Service: Endoscopy;  Laterality: N/A;    ROS:  General: Negative for anorexia, weight loss, fever, chills, fatigue, weakness. ENT: Negative for hoarseness, difficulty swallowing , nasal congestion. CV: Negative for chest pain, angina, palpitations, dyspnea on exertion, peripheral edema.  Respiratory: Negative for dyspnea at rest, dyspnea on exertion, cough, sputum, wheezing.  GI: See history of present illness. GU:  Negative for dysuria, hematuria, urinary incontinence, urinary frequency, nocturnal urination.  Endo: Negative for unusual weight change.    Physical Examination:   BP (!) 143/82   Pulse 73   Temp (!) 96.6 F (35.9 C) (Temporal)   Ht 5\' 9"  (1.753 m)   Wt 158 lb 9.6 oz (71.9 kg)   BMI 23.42 kg/m   General: Well-nourished, well-developed in no acute distress.  Eyes: No icterus. Mouth: masked Lungs: Clear to auscultation bilaterally.  Heart: Regular rate and rhythm, no murmurs rubs or gallops.  Abdomen: Bowel sounds are normal, increased gas noted left abd, nontender, nondistended, no hepatosplenomegaly or masses, no abdominal bruits or hernia , no rebound or guarding.   Extremities: No lower extremity edema. No clubbing or deformities. Neuro: Alert and oriented x 4   Skin: Warm and dry, no jaundice.   Psych: Alert and cooperative, normal mood and  affect.  Labs:  Lab Results  Component Value Date   [id number] 706 08/16/2018   Lab Results  Component Value Date   FOLATE 50.8 08/16/2018   Lab Results  Component Value Date   IRON 92 11/16/2018   TIBC 253 11/16/2018   FERRITIN 334 11/16/2018   Lab Results  Component Value Date   WBC 9.2 11/16/2018   HGB 13.4 11/16/2018   HCT 42.4 11/16/2018   MCV 98.6 11/16/2018   PLT 175 11/16/2018   Lab Results  Component Value Date   ALT 19 11/16/2018   AST 19 11/16/2018   ALKPHOS 103 11/16/2018   BILITOT 0.3 11/16/2018   Lab Results  Component Value Date   CREATININE 1.19 11/16/2018   BUN 7 (L) 11/16/2018   NA 136 11/16/2018   K 4.7 11/16/2018   CL 109 11/16/2018   CO2 20 (L) 11/16/2018   Lab Results  Component Value Date   CEA 2.5 11/16/2018     Imaging Studies: No results found.   Impression/plan:  69 year old gentleman with numerous abdominal surgeries as outlined, history of colon cancer, hemigastrectomy, reports no longer has any colon left after last surgery in July, pancreatic insufficiency presenting for chronic diarrhea, gas, postprandial abdominal distention, nausea, early satiety, refractory GERD.  Pancreatic insufficiency: He felt like it did seem to help before but he came off enzymes after his surgery in July 2020.  Recently restarted.  Based on weight, would increase current dose 72,000 lipase units with meals and 36,000 lipase units with snacks.  Inform patient that he may actually require higher dose than that we will start with this dose and work her way up accordingly.  It is not clear how much of his GI symptoms are explained by his pancreatic insufficiency but I suspect that treatment will help his diarrhea, gas.  Refractory GERD: Has been on Prevacid twice daily for some time now.  We will switch to pantoprazole 40 mg 1-2 times daily before meals, titrate dose up to 2 daily if needed.  Carafate up to 4 times daily for breakthrough symptoms,  short-term use only.  Postprandial abdominal distention, nausea, early satiety.  Numerous bowel obstructions in the past, colon removed,  some small bowel removed, hemigastrectomy for benign tumor remotely, history of total of 16 abdominal surgeries.  Last CT imaging around the time of his surgery summer 2020.  He is due for an EGD later this year for history of Barrett's.  Medication changes as outlined.  If the symptoms persist, will need further work-up.  He is to call with a progress report in a couple of weeks.  Return to the office in 3 months.  I performed extensive view medical records here locally and at [facility].

## 2019-04-25 NOTE — Patient Instructions (Signed)
1. Creon to with meals and 1 with snacks.  I have increased your dose. If you eat a fatty meal or snack, add an additional capsule. Take capsules after FIRST bite of food.  2. Stop prevacid. Start pantoprazole 40 mg 30 minutes before practice daily.  You may take an additional dose 30 minutes before evening meal needed to control heartburn. 3. Carafate up to 4 times daily as needed for breakthrough reflux symptoms.  Carafate is considered a short-term medication not meant for chronic use. 4. Call in 2 weeks and let me know how you are feeling.  If you are still having significant diarrhea, gas, abdominal pain, appetite concerns we will need to consider changing management of your care/further work-up. 5. You will be due EGD in August of this year for history of Barrett's esophagus. 6. Return to the office in 3 months.

## 2019-05-02 ENCOUNTER — Encounter (HOSPITAL_COMMUNITY): Payer: Self-pay | Admitting: Emergency Medicine

## 2019-05-02 ENCOUNTER — Other Ambulatory Visit: Payer: Self-pay

## 2019-05-02 ENCOUNTER — Emergency Department (HOSPITAL_COMMUNITY): Payer: Medicare Other

## 2019-05-02 ENCOUNTER — Inpatient Hospital Stay (HOSPITAL_COMMUNITY)
Admission: EM | Admit: 2019-05-02 | Discharge: 2019-05-06 | DRG: 389 | Disposition: A | Discharge status: Home / Self Care | Payer: Medicare Other | Attending: Family Medicine | Admitting: Family Medicine

## 2019-05-02 DIAGNOSIS — G8929 Other chronic pain: Secondary | ICD-10-CM | POA: Diagnosis present

## 2019-05-02 DIAGNOSIS — R7401 Elevation of levels of liver transaminase levels: Secondary | ICD-10-CM | POA: Diagnosis present

## 2019-05-02 DIAGNOSIS — R112 Nausea with vomiting, unspecified: Secondary | ICD-10-CM | POA: Diagnosis present

## 2019-05-02 DIAGNOSIS — K219 Gastro-esophageal reflux disease without esophagitis: Secondary | ICD-10-CM | POA: Diagnosis present

## 2019-05-02 DIAGNOSIS — R109 Unspecified abdominal pain: Secondary | ICD-10-CM | POA: Diagnosis present

## 2019-05-02 DIAGNOSIS — K56600 Partial intestinal obstruction, unspecified as to cause: Secondary | ICD-10-CM | POA: Diagnosis present

## 2019-05-02 DIAGNOSIS — Z87891 Personal history of nicotine dependence: Secondary | ICD-10-CM

## 2019-05-02 DIAGNOSIS — Z9221 Personal history of antineoplastic chemotherapy: Secondary | ICD-10-CM

## 2019-05-02 DIAGNOSIS — E785 Hyperlipidemia, unspecified: Secondary | ICD-10-CM

## 2019-05-02 DIAGNOSIS — C189 Malignant neoplasm of colon, unspecified: Secondary | ICD-10-CM

## 2019-05-02 DIAGNOSIS — M810 Age-related osteoporosis without current pathological fracture: Secondary | ICD-10-CM | POA: Diagnosis present

## 2019-05-02 DIAGNOSIS — Z86711 Personal history of pulmonary embolism: Secondary | ICD-10-CM

## 2019-05-02 DIAGNOSIS — Z7901 Long term (current) use of anticoagulants: Secondary | ICD-10-CM

## 2019-05-02 DIAGNOSIS — K56609 Unspecified intestinal obstruction, unspecified as to partial versus complete obstruction: Secondary | ICD-10-CM

## 2019-05-02 DIAGNOSIS — Z0189 Encounter for other specified special examinations: Secondary | ICD-10-CM

## 2019-05-02 DIAGNOSIS — Z8673 Personal history of transient ischemic attack (TIA), and cerebral infarction without residual deficits: Secondary | ICD-10-CM

## 2019-05-02 DIAGNOSIS — Z79891 Long term (current) use of opiate analgesic: Secondary | ICD-10-CM

## 2019-05-02 DIAGNOSIS — K8689 Other specified diseases of pancreas: Secondary | ICD-10-CM

## 2019-05-02 DIAGNOSIS — Z903 Acquired absence of stomach [part of]: Secondary | ICD-10-CM

## 2019-05-02 DIAGNOSIS — N179 Acute kidney failure, unspecified: Secondary | ICD-10-CM | POA: Diagnosis present

## 2019-05-02 DIAGNOSIS — K567 Ileus, unspecified: Secondary | ICD-10-CM | POA: Diagnosis present

## 2019-05-02 DIAGNOSIS — Z9049 Acquired absence of other specified parts of digestive tract: Secondary | ICD-10-CM

## 2019-05-02 DIAGNOSIS — I10 Essential (primary) hypertension: Secondary | ICD-10-CM | POA: Diagnosis present

## 2019-05-02 DIAGNOSIS — R630 Anorexia: Secondary | ICD-10-CM

## 2019-05-02 DIAGNOSIS — Z85038 Personal history of other malignant neoplasm of large intestine: Secondary | ICD-10-CM | POA: Diagnosis present

## 2019-05-02 DIAGNOSIS — Z79899 Other long term (current) drug therapy: Secondary | ICD-10-CM

## 2019-05-02 DIAGNOSIS — Z20822 Contact with and (suspected) exposure to covid-19: Secondary | ICD-10-CM | POA: Diagnosis present

## 2019-05-02 DIAGNOSIS — K2100 Gastro-esophageal reflux disease with esophagitis, without bleeding: Secondary | ICD-10-CM | POA: Diagnosis present

## 2019-05-02 DIAGNOSIS — E538 Deficiency of other specified B group vitamins: Secondary | ICD-10-CM

## 2019-05-02 DIAGNOSIS — R680 Hypothermia, not associated with low environmental temperature: Secondary | ICD-10-CM | POA: Diagnosis present

## 2019-05-02 DIAGNOSIS — G40909 Epilepsy, unspecified, not intractable, without status epilepticus: Secondary | ICD-10-CM | POA: Diagnosis present

## 2019-05-02 DIAGNOSIS — R1012 Left upper quadrant pain: Secondary | ICD-10-CM | POA: Diagnosis present

## 2019-05-02 DIAGNOSIS — Z86718 Personal history of other venous thrombosis and embolism: Secondary | ICD-10-CM

## 2019-05-02 DIAGNOSIS — Z8249 Family history of ischemic heart disease and other diseases of the circulatory system: Secondary | ICD-10-CM

## 2019-05-02 DIAGNOSIS — F419 Anxiety disorder, unspecified: Secondary | ICD-10-CM

## 2019-05-02 LAB — COMPREHENSIVE METABOLIC PANEL
ALT: 79 U/L — ABNORMAL HIGH (ref 0–44)
AST: 70 U/L — ABNORMAL HIGH (ref 15–41)
Albumin: 3.7 g/dL (ref 3.5–5.0)
Alkaline Phosphatase: 66 U/L (ref 38–126)
Anion gap: 9 (ref 5–15)
BUN: 26 mg/dL — ABNORMAL HIGH (ref 8–23)
CO2: 25 mmol/L (ref 22–32)
Calcium: 8.7 mg/dL — ABNORMAL LOW (ref 8.9–10.3)
Chloride: 106 mmol/L (ref 98–111)
Creatinine, Ser: 1.88 mg/dL — ABNORMAL HIGH (ref 0.61–1.24)
GFR calc Af Amer: 42 mL/min — ABNORMAL LOW (ref >60)
GFR calc non Af Amer: 36 mL/min — ABNORMAL LOW (ref >60)
Glucose, Bld: 102 mg/dL — ABNORMAL HIGH (ref 70–99)
Potassium: 4.6 mmol/L (ref 3.5–5.1)
Sodium: 140 mmol/L (ref 135–145)
Total Bilirubin: 0.6 mg/dL (ref 0.3–1.2)
Total Protein: 6.9 g/dL (ref 6.5–8.1)

## 2019-05-02 LAB — URINALYSIS, ROUTINE W REFLEX MICROSCOPIC
Bilirubin Urine: NEGATIVE
Glucose, UA: NEGATIVE mg/dL
Hgb urine dipstick: NEGATIVE
Ketones, ur: NEGATIVE mg/dL
Leukocytes,Ua: NEGATIVE
Nitrite: NEGATIVE
Protein, ur: NEGATIVE mg/dL
Specific Gravity, Urine: 1.019 (ref 1.005–1.030)
pH: 5 (ref 5.0–8.0)

## 2019-05-02 LAB — CBC
HCT: 49.6 % (ref 39.0–52.0)
Hemoglobin: 15.9 g/dL (ref 13.0–17.0)
MCH: 30.6 pg (ref 26.0–34.0)
MCHC: 32.1 g/dL (ref 30.0–36.0)
MCV: 95.6 fL (ref 80.0–100.0)
Platelets: 154 10*3/uL (ref 150–400)
RBC: 5.19 MIL/uL (ref 4.22–5.81)
RDW: 14.1 % (ref 11.5–15.5)
WBC: 9.9 10*3/uL (ref 4.0–10.5)
nRBC: 0 % (ref 0.0–0.2)

## 2019-05-02 LAB — LIPASE, BLOOD: Lipase: 51 U/L (ref 11–51)

## 2019-05-02 MED ORDER — SODIUM CHLORIDE 0.9% FLUSH: 3.0000 mL | Freq: Once | INTRAVENOUS | Status: DC

## 2019-05-02 MED ORDER — HYDROMORPHONE HCL 1 MG/ML IJ SOLN
1.0000 mg | Freq: Once | INTRAMUSCULAR | Status: AC
  Administered 2019-05-02: 20:00:00 1 mg via INTRAVENOUS
  Filled 2019-05-02: qty 1

## 2019-05-02 MED ORDER — ONDANSETRON HCL 4 MG/2ML IJ SOLN
4.0000 mg | Freq: Once | INTRAMUSCULAR | Status: AC
  Administered 2019-05-02: 20:00:00 4 mg via INTRAVENOUS
  Filled 2019-05-02: qty 2

## 2019-05-02 MED ORDER — ONDANSETRON HCL 4 MG/2ML IJ SOLN
4.0000 mg | Freq: Once | INTRAMUSCULAR | Status: AC
  Administered 2019-05-02: 23:00:00 4 mg via INTRAVENOUS

## 2019-05-02 MED ORDER — ONDANSETRON HCL 4 MG/2ML IJ SOLN
4.0000 mg | Freq: Once | INTRAMUSCULAR | Status: AC
  Administered 2019-05-02: 23:00:00 4 mg via INTRAVENOUS
  Filled 2019-05-02: qty 2

## 2019-05-02 MED ORDER — IOHEXOL 300 MG/ML  SOLN
75.0000 mL | Freq: Once | INTRAMUSCULAR | Status: AC | PRN
  Administered 2019-05-02: 21:00:00 75 mL via INTRAVENOUS

## 2019-05-02 MED ORDER — HYDROMORPHONE HCL 1 MG/ML IJ SOLN
1.0000 mg | Freq: Once | INTRAMUSCULAR | Status: AC
  Administered 2019-05-02: 22:00:00 1 mg via INTRAVENOUS
  Filled 2019-05-02: qty 1

## 2019-05-02 NOTE — ED Notes (Signed)
Attempted ng tube twice but tube keeps curling in esophagus. Pt cannot tolerate a 3rd attempt. EDPA notified.

## 2019-05-02 NOTE — ED Notes (Signed)
ED TO INPATIENT HANDOFF REPORT  ED Nurse Name and Phone #: [phone]  S Name/Age/Gender [name] 69 y.o. male Room/Bed: APA04/APA04  Code Status   Code Status: Prior  Home/SNF/Other Home Patient oriented to: situation Is this baseline? Yes   Triage Complete: Triage complete  Chief Complaint abd pain, nausea , vomting   Triage Note Patient reports abdominal pain with nausea and vomiting that started on Tuesday morning. L sided upper abdominal pain.    Allergies No Known Allergies  Level of Care/Admitting Diagnosis ED Disposition    None      B Medical/Surgery History Past Medical History:  Diagnosis Date  . 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 Anemia   . Anxiety   . Arthritis   . Barrett's esophagus   . Blood transfusion   . Bowel obstruction ([place]) 05/13/2012   Recurrent  . Bronchitis   . Chest pain at rest   . Chronic abdominal pain   . Erosive esophagitis   . ETOH abuse    quit 03/2010  . GERD (gastroesophageal reflux disease)   . Hx of Clostridium difficile infection 01/2012  . Hypertension   . Ileus ([place])   . Iron deficiency anemia 03/23/2016  . Obstruction of bowel ([place]) 03/03/14  . Osteoporosis   . Personal history of PE (pulmonary embolism) 10/01/2010  . Pneumonia   . Pulmonary embolism ([place]) 02/2010  . Recurrent upper respiratory infection (URI)   . Renal disorder   . S/P endoscopy September 28, 2010   erosive reflux esophagitis, Billroth I anatomy  . 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 Seizures ([place])   . Shortness of breath   . TIA (transient ischemic attack) 10/11  . Vitamin B12 deficiency    Past Surgical History:  Procedure Laterality Date  . ABDOMINAL ADHESION SURGERY  03/04/15   @ [facility]  . ABDOMINAL EXPLORATION SURGERY    . abdominal sugery     for bowel obstruction x 8, all in 1980s, except for one in 07/2010  . APPENDECTOMY  1980s  . Billroth 1 hemigastrectomy  1980s   per patient for benign  duodenal tumor  . CARDIAC CATHETERIZATION  07/17/2012  . CHOLECYSTECTOMY  1980s  . COLON SURGERY  May 2012   left hemicolectomy, colon cancer found at time of surgery for bowel obstruction  . COLON SURGERY  10/12/2018   At [facility]: Resection of right colon and terminal ileum with creation of ileorectal anastomosis for large bowel obstruction.  Cecum and ascending colon noted to be attached to the rectum.  . COLONOSCOPY  03/18/2011   anastomosis at 35cm. Several adenomatous polyps removed. Sigmoid diverticulosis. Next TCS 02/2013  . COLONOSCOPY N/A 07/24/2012   KGM:WNUUVO post segmental resection with normal-appearing colonic anastomosis aside from an adjacent polyp-removed as described above. Rectal polyp-removed as described above. CT findings appear to have been artifactual. tubular adenomas/prolapsed type polyp.  . COLONOSCOPY N/A 05/15/2015   Procedure: COLONOSCOPY;  Surgeon: [name], MD;  [facility];  Service: Endoscopy;  Laterality: N/A;  . COLONOSCOPY  09/26/2018   at [facility]: prep not adequate for colon cancer surveillance.  Prior end-to-end colonic anastomosis in the rectosigmoid region.  This was patent and characterized by healthy-appearing mucosa.  Anastomosis was traversed.  Normal terminal ileum.  . COLONOSCOPY WITH PROPOFOL N/A 11/04/2016   Dr. [name], status post subtotal colectomy with normal-appearing residual lower GI tract.  Next colonoscopy in 5 years.  . ESOPHAGOGASTRODUODENOSCOPY  09/28/2010  . ESOPHAGOGASTRODUODENOSCOPY  12/01/2010   Cervical web status post dilation, erosive esophagitis, B1 hemigastrectomy, inflamed anastomosis  . ESOPHAGOGASTRODUODENOSCOPY  04/16/2011   excoriation at [facility] c/w trauma/M-W tear, friable gastric anastomosis, dilation efferent limb  . ESOPHAGOGASTRODUODENOSCOPY N/A 06/03/2014   Dr.[name]- cervcal esopphageal web s/p dilation. abnormal distal esophagus bx= barretts esophagus  . ESOPHAGOGASTRODUODENOSCOPY N/A 05/15/2015   Procedure:  ESOPHAGOGASTRODUODENOSCOPY (EGD);  Surgeon: [name], MD;  [facility];  Service: Endoscopy;  Laterality: N/A;  230  . ESOPHAGOGASTRODUODENOSCOPY (EGD) WITH ESOPHAGEAL DILATION  02/25/2012   TDD:UKGURKYH esophageal web-s/p dilation anddisruption as described above. Status post prior gastric with Billroth I configuration. Abnormal gastric mucosa at the anastomosis. Gastric biopsy showed mild chronic inflammation but no H. pylori   . ESOPHAGOGASTRODUODENOSCOPY (EGD) WITH PROPOFOL N/A 11/04/2016   Dr. [name]: Reflux esophagitis, small hiatal hernia status post hemigastrectomy.  Single patent efferent small bowel limb appeared normal, 2 x 2 centimeter tongue of salmon epithelium again seen, esophageal dilation.  Biopsies consistent with reflux changes, not Barrett's however this was confirmed on prior EGDs.  Offer 3-year follow-up EGD August 2021.  [name] HERNIA REPAIR     right inguinal  . MALONEY DILATION N/A 06/03/2014   Procedure: [name] DILATION;  Surgeon: [name], MD;  [facility];  Service: Endoscopy;  Laterality: N/A;  [name] DILATION N/A 05/15/2015   Procedure: [name] DILATION;  Surgeon: [name], MD;  [facility];  Service: Endoscopy;  Laterality: N/A;  . [name] DILATION N/A 11/04/2016   Procedure: [name] DILATION;  Surgeon: [name], MD;  [facility];  Service: Endoscopy;  Laterality: N/A;  . PORTACATH PLACEMENT    . SAVORY DILATION N/A 06/03/2014   Procedure: SAVORY DILATION;  Surgeon: [name], MD;  [facility];  Service: Endoscopy;  Laterality: N/A;     A IV Location/Drains/Wounds Patient Lines/Drains/Airways Status   Active Line/Drains/Airways    Name:   Placement date:   Placement time:   Site:   Days:   Implanted Port 05/02/19 Left   05/02/19    1955    --   less than 1          Intake/Output Last 24 hours No intake or output data in the 24 hours ending 05/02/19  2316  Labs/Imaging Results for orders placed or performed during the hospital encounter of 05/02/19 (from the past 48 hour(s))  Urinalysis, Routine w reflex microscopic     Status: None   Collection Time: 05/02/19  5:07 PM  Result Value Ref Range   Color, Urine YELLOW YELLOW   APPearance CLEAR CLEAR   Specific Gravity, Urine 1.019 1.005 - 1.030   pH 5.0 5.0 - 8.0   Glucose, UA NEGATIVE NEGATIVE mg/dL   Hgb urine dipstick NEGATIVE NEGATIVE   Bilirubin Urine NEGATIVE NEGATIVE   Ketones, ur NEGATIVE NEGATIVE mg/dL   Protein, ur NEGATIVE NEGATIVE mg/dL   Nitrite NEGATIVE NEGATIVE   Leukocytes,Ua NEGATIVE NEGATIVE    Comment: Performed at [facility], [address] [place], [place] [address]  Lipase, blood     Status: None   Collection Time: 05/02/19  5:23 PM  Result Value Ref Range   Lipase 51 11 - 51 U/L    Comment: Performed at [facility], [address], [place], [place] [address]  Comprehensive metabolic panel     Status: Abnormal   Collection Time: 05/02/19  5:23 PM  Result Value Ref Range  Sodium 140 135 - 145 mmol/L   Potassium 4.6 3.5 - 5.1 mmol/L   Chloride 106 98 - 111 mmol/L   CO2 25 22 - 32 mmol/L   Glucose, Bld 102 (H) 70 - 99 mg/dL   BUN 26 (H) 8 - 23 mg/dL   Creatinine, Ser 1.88 (H) 0.61 - 1.24 mg/dL   Calcium 8.7 (L) 8.9 - 10.3 mg/dL   Total Protein 6.9 6.5 - 8.1 g/dL   Albumin 3.7 3.5 - 5.0 g/dL   AST 70 (H) 15 - 41 U/L   ALT 79 (H) 0 - 44 U/L   Alkaline Phosphatase 66 38 - 126 U/L   Total Bilirubin 0.6 0.3 - 1.2 mg/dL   GFR calc non Af Amer 36 (L) >60 mL/min   GFR calc Af Amer 42 (L) >60 mL/min   Anion gap 9 5 - 15    Comment: Performed at [facility], [address] [place], [place] [address]  CBC     Status: None   Collection Time: 05/02/19  5:23 PM  Result Value Ref Range   WBC 9.9 4.0 - 10.5 K/uL   RBC 5.19 4.22 - 5.81 MIL/uL   Hemoglobin 15.9 13.0 - 17.0 g/dL   HCT 49.6 39.0 - 52.0 %   MCV 95.6 80.0 - 100.0 fL   MCH 30.6 26.0 - 34.0 pg    MCHC 32.1 30.0 - 36.0 g/dL   RDW 14.1 11.5 - 15.5 %   Platelets 154 150 - 400 K/uL   nRBC 0.0 0.0 - 0.2 %    Comment: Performed at [facility], [address], [place], [place] [address]   CT ABDOMEN PELVIS W CONTRAST  Result Date: 05/02/2019 CLINICAL DATA:  Abdominal pain, nausea and vomiting. Bowel obstruction suspected. EXAM: CT ABDOMEN AND PELVIS WITH CONTRAST TECHNIQUE: Multidetector CT imaging of the abdomen and pelvis was performed using the standard protocol following bolus administration of intravenous contrast. CONTRAST:  74mL OMNIPAQUE IOHEXOL 300 MG/ML  SOLN COMPARISON:  CT 05/10/2018, additional priors. FINDINGS: Lower chest: Linear scarring in the right lower lobe. No acute airspace disease or pleural fluid. Hepatobiliary: Stable tiny hepatic hypodensity in the left lobe, too small to accurately characterize. No new hepatic lesion. Postcholecystectomy with stable biliary dilatation, likely sequela of prior cholecystectomy. Pancreas: No peripancreatic inflammation. Prominent pancreatic duct appears similar to prior, 3 mm proximally. Spleen: No acute findings. No focal abnormality. Upper normal in size. Adrenals/Urinary Tract: Normal adrenal glands. No hydronephrosis or perinephric edema. Homogeneous renal enhancement with symmetric excretion on delayed phase imaging. 5.8 cm cyst in the mid right kidney again seen. Punctate nonobstructing stones in both kidneys versus vascular calcifications. Small low-density lesions in the upper right kidney are too small to characterize. Urinary bladder is physiologically distended without wall thickening. Stomach/Bowel: Bowel evaluation is limited in the absence of enteric contrast. Small hiatal hernia. Partial gastrectomy with hepatic jejunostomy. Post subtotal colectomy with anastomosis in the sigmoid colon. Small bowel loops appear fluid-filled and dilated, however similar to prior exam. No discrete transition point to suggest obstruction.  Vascular/Lymphatic: Aortic atherosclerosis. No aortic aneurysm. Portal vein is patent. Few central mesenteric nodes are prominent but not enlarged by size criteria. There is no pelvic adenopathy. Reproductive: Prominent prostate gland spans 5.4 cm. Other: No free air free fluid. No mesenteric edema. No evidence of intra-abdominal abscess. Musculoskeletal: Transitional lumbosacral anatomy. There are no acute or suspicious osseous abnormalities. IMPRESSION: 1. Dilated fluid-filled small bowel in the right abdomen that appears chronic and similar  to prior exams, suggesting small bowel dysmotility. No discrete transition point to suggest obstruction. Post subtotal colectomy and partial gastrectomy with gastrojejunostomy. 2. Additional stable chronic findings as described. Aortic Atherosclerosis (ICD10-I70.0). Electronically Signed   By: Keith Rake M.D.   On: 05/02/2019 20:59   DG Chest Portable 1 View  Result Date: 05/02/2019 CLINICAL DATA:  NG tube placement EXAM: PORTABLE CHEST 1 VIEW COMPARISON:  04/01/2018 FINDINGS: Left Port-A-Cath remains in place, unchanged. NG tube coils in the cervical region, partially imaged on the superior aspect of the image. Heart is normal size. No confluent airspace opacities or effusions. No acute bony abnormality. IMPRESSION: NG tube coils in the cervical region. This should be removed completely and replaced. No acute cardiopulmonary disease. Electronically Signed   By: Rolm Baptise M.D.   On: 05/02/2019 22:53   DG Chest Port 1V same Day  Result Date: 05/02/2019 CLINICAL DATA:  Nasogastric repositioning. EXAM: PORTABLE CHEST 1 VIEW COMPARISON:  05/02/2019 FINDINGS: Tube remains coiled in the hypopharynx or upper cervical esophagus, not extending below the thoracic inlet. Lungs remain clear. No other change. IMPRESSION: Tube remains coiled in the neck. Electronically Signed   By: [name] Chimes M.D.   On: 05/02/2019 22:59    Pending Labs Unresulted Labs (From admission,  onward)   None      Vitals/Pain Today's Vitals   05/02/19 2042 05/02/19 2100 05/02/19 2200 05/02/19 2252  BP:  (!) 122/93 128/82   Pulse:  73 67   Resp:      Temp:      TempSrc:      SpO2:  95% 94%   Weight:      Height:      PainSc: 3    3     Isolation Precautions No active isolations  Medications Medications  sodium chloride flush (NS) 0.9 % injection 3 mL (has no administration in time range)  HYDROmorphone (DILAUDID) injection 1 mg (1 mg Intravenous Given 05/02/19 1956)  ondansetron (ZOFRAN) injection 4 mg (4 mg Intravenous Given 05/02/19 1956)  iohexol (OMNIPAQUE) 300 MG/ML solution 75 mL (75 mLs Intravenous Contrast Given 05/02/19 2031)  HYDROmorphone (DILAUDID) injection 1 mg (1 mg Intravenous Given 05/02/19 2220)  ondansetron (ZOFRAN) injection 4 mg (4 mg Intravenous Given 05/02/19 2245)  ondansetron (ZOFRAN) injection 4 mg (4 mg Intravenous Given 05/02/19 2302)    Mobility walks Low fall risk   Focused Assessments    R Recommendations: See Admitting Provider Note  Report given to:   Additional Notes:

## 2019-05-02 NOTE — ED Provider Notes (Signed)
[facility] EMERGENCY DEPARTMENT Provider Note   CSN: [id number] Arrival date & time: 05/02/19  1649     History Chief Complaint  Patient presents with  . Abdominal Pain    [name] is a 69 y.o. male.  The history is provided by the patient. No language interpreter was used.  Abdominal Pain Pain location:  Generalized Pain quality: aching   Pain radiates to:  Does not radiate Pain severity:  No pain Timing:  Constant Progression:  Worsening Chronicity:  New Context: not sick contacts   Relieved by:  Nothing Worsened by:  Nothing Ineffective treatments:  None tried Associated symptoms: no constipation   Risk factors: multiple surgeries   Pt complains of abdominal pain.  He has had multiple bowel obstructions.  Pt has had 16 previous abdominal surgeries  Pt reports symptoms normally resolve with bowel     Past Medical History:  Diagnosis Date  . 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 Anemia   . Anxiety   . Arthritis   . Barrett's esophagus   . Blood transfusion   . Bowel obstruction ([place]) 05/13/2012   Recurrent  . Bronchitis   . Chest pain at rest   . Chronic abdominal pain   . Erosive esophagitis   . ETOH abuse    quit 03/2010  . GERD (gastroesophageal reflux disease)   . Hx of Clostridium difficile infection 01/2012  . Hypertension   . Ileus ([place])   . Iron deficiency anemia 03/23/2016  . Obstruction of bowel ([place]) 03/03/14  . Osteoporosis   . Personal history of PE (pulmonary embolism) 10/01/2010  . Pneumonia   . Pulmonary embolism ([place]) 02/2010  . Recurrent upper respiratory infection (URI)   . Renal disorder   . S/P endoscopy September 28, 2010   erosive reflux esophagitis, Billroth I anatomy  . 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 Seizures ([place])   . Shortness of breath   . TIA (transient ischemic attack) 10/11  . Vitamin B12 deficiency     Patient Active Problem List   Diagnosis Date Noted  . Early satiety 04/25/2019    . Seizure disorder ([place]) 09/16/2016  . CKD (chronic kidney disease) stage 3, GFR 30-59 ml/min 06/14/2016  . Patient has nasogastric tube   . Ileus ([place]) 05/15/2016  . Chronic diastolic heart failure ([place]) 05/15/2016  . Iron deficiency anemia 03/23/2016  . Acute renal failure ([place]) 03/19/2016  . Normocytic anemia 03/19/2016  . Hypotension 03/19/2016  . Anemia   . History of colon cancer, stage III   . Hx of colon cancer, stage III   . History of colonic polyps   . Barrett's esophagus without dysplasia   . Dysphagia   . Essential hypertension 11/30/2014  . Dysphagia, pharyngoesophageal phase   . Mucosal abnormality of esophagus   . SBO (small bowel obstruction) ([place]) 09/17/2013  . Rectal bleeding 07/11/2012  . Bowel obstruction ([place]) 07/11/2012  . Abnormal CT scan, colon 07/11/2012  . Esophageal dysphagia 02/22/2012  . H/O Clostridium difficile infection 02/22/2012  . Diarrhea 09/10/2011  . Cellulitis 09/10/2011  . Nausea 06/07/2011  . Abdominal distention 06/07/2011  . HTN (hypertension) 06/07/2011  . Partial small bowel obstruction ([place]) 05/31/2011  . GI bleed 05/27/2011  . Coagulopathy ([place]) 05/27/2011  . Gastroenteritis 05/27/2011  . Small bowel obstruction ([place]) 05/13/2011  . LUQ pain 05/13/2011  . Pulmonary embolism ([place]) 01/17/2011  . Chest pain at rest 01/17/2011  . Pneumonia 12/11/2010  .  Hematemesis 12/05/2010  . Chronic abdominal pain 12/05/2010  . Pancytopenia due to antineoplastic chemotherapy ([place]) 12/05/2010  . Coffee ground emesis 11/29/2010  . Esophageal reflux disease 11/15/2010  . 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 Personal history of PE (pulmonary embolism) 10/01/2010  . Bronchitis, acute 10/01/2010  . Erosive esophagitis 10/01/2010  . Fever chills 09/29/2010  . Bleeding gastrointestinal 09/26/2010  . Supratherapeutic INR 09/26/2010  . Abdominal pain 09/26/2010  . Nausea and vomiting 09/26/2010  . Anemia, chronic disease 09/26/2010  .  Adenocarcinoma of colon with mucinous features 09/22/2010    Past Surgical History:  Procedure Laterality Date  . ABDOMINAL ADHESION SURGERY  03/04/15   @ [facility]  . ABDOMINAL EXPLORATION SURGERY    . abdominal sugery     for bowel obstruction x 8, all in 1980s, except for one in 07/2010  . APPENDECTOMY  1980s  . Billroth 1 hemigastrectomy  1980s   per patient for benign duodenal tumor  . CARDIAC CATHETERIZATION  07/17/2012  . CHOLECYSTECTOMY  1980s  . COLON SURGERY  May 2012   left hemicolectomy, colon cancer found at time of surgery for bowel obstruction  . COLON SURGERY  10/12/2018   At [facility]: Resection of right colon and terminal ileum with creation of ileorectal anastomosis for large bowel obstruction.  Cecum and ascending colon noted to be attached to the rectum.  . COLONOSCOPY  03/18/2011   anastomosis at 35cm. Several adenomatous polyps removed. Sigmoid diverticulosis. Next TCS 02/2013  . COLONOSCOPY N/A 07/24/2012   MWN:[id number] post segmental resection with normal-appearing colonic anastomosis aside from an adjacent polyp-removed as described above. Rectal polyp-removed as described above. CT findings appear to have been artifactual. tubular adenomas/prolapsed type polyp.  . COLONOSCOPY N/A 05/15/2015   Procedure: COLONOSCOPY;  Surgeon: [name], MD;  Location: [facility];  Service: Endoscopy;  Laterality: N/A;  . COLONOSCOPY  09/26/2018   at [facility]: prep not adequate for colon cancer surveillance.  Prior end-to-end colonic anastomosis in the rectosigmoid region.  This was patent and characterized by healthy-appearing mucosa.  Anastomosis was traversed.  Normal terminal ileum.  . COLONOSCOPY WITH PROPOFOL N/A 11/04/2016   Dr. [name], status post subtotal colectomy with normal-appearing residual lower GI tract.  Next colonoscopy in 5 years.  . ESOPHAGOGASTRODUODENOSCOPY  09/28/2010  . ESOPHAGOGASTRODUODENOSCOPY  12/01/2010   Cervical web status post dilation, erosive esophagitis, B1  hemigastrectomy, inflamed anastomosis  . ESOPHAGOGASTRODUODENOSCOPY  04/16/2011   excoriation at GEJ c/w trauma/M-W tear, friable gastric anastomosis, dilation efferent limb  . ESOPHAGOGASTRODUODENOSCOPY N/A 06/03/2014   Dr.[name]- cervcal esopphageal web s/p dilation. abnormal distal esophagus bx= barretts esophagus  . ESOPHAGOGASTRODUODENOSCOPY N/A 05/15/2015   Procedure: ESOPHAGOGASTRODUODENOSCOPY (EGD);  Surgeon: [name], MD;  Location: [facility];  Service: Endoscopy;  Laterality: N/A;  230  . ESOPHAGOGASTRODUODENOSCOPY (EGD) WITH ESOPHAGEAL DILATION  02/25/2012   GUY:[id number] esophageal web-s/p dilation anddisruption as described above. Status post prior gastric with Billroth I configuration. Abnormal gastric mucosa at the anastomosis. Gastric biopsy showed mild chronic inflammation but no H. pylori   . ESOPHAGOGASTRODUODENOSCOPY (EGD) WITH PROPOFOL N/A 11/04/2016   Dr. [name]: Reflux esophagitis, small hiatal hernia status post hemigastrectomy.  Single patent efferent small bowel limb appeared normal, 2 x 2 centimeter tongue of salmon epithelium again seen, esophageal dilation.  Biopsies consistent with reflux changes, not Barrett's however this was confirmed on prior EGDs.  Offer 3-year follow-up EGD August 2021.  [name] HERNIA REPAIR     right inguinal  . MALONEY DILATION  N/A 06/03/2014   Procedure: [name] DILATION;  Surgeon: [name], MD;  Location: [facility];  Service: Endoscopy;  Laterality: N/A;  [name] DILATION N/A 05/15/2015   Procedure: [name] DILATION;  Surgeon: [name], MD;  Location: [facility];  Service: Endoscopy;  Laterality: N/A;  . [name] DILATION N/A 11/04/2016   Procedure: [name] DILATION;  Surgeon: [name], MD;  Location: [facility];  Service: Endoscopy;  Laterality: N/A;  . PORTACATH PLACEMENT    . SAVORY DILATION N/A 06/03/2014   Procedure: SAVORY DILATION;  Surgeon: [name], MD;  Location: [facility];  Service: Endoscopy;   Laterality: N/A;       Family History  Problem Relation Age of Onset  . Hypertension Mother   . Arthritis Mother   . Pneumonia Mother   . Hypertension Father   . Heart attack Father   . Colon cancer Neg Hx     Social History   Tobacco Use  . Smoking status: Former Smoker    Packs/day: 0.50    Years: 40.00    Pack years: 20.00    Types: Cigarettes    Quit date: 12/20/2012    Years since quitting: 6.3  . Smokeless tobacco: Never Used  Substance Use Topics  . Alcohol use: No  . Drug use: No    Home Medications Prior to Admission medications   Medication Sig Start Date End Date Taking? Authorizing Provider  acetaminophen (TYLENOL) 325 MG tablet TAKE 3 TABLETS BY MOUTH EVERY 6 HOURS AS NEEDED FOR UP TO 10 DAYS 10/28/18   [provider]  ammonium lactate (AMLACTIN) 12 % cream APPLY CREAM TO DRY SKIN ON LEGS ARMS HANDS AND BACK AREA ONCE TO TWICE DAILY (AVOID FACE GROIN UNDERARMS) 11/22/17   [provider]  atorvastatin (LIPITOR) 20 MG tablet Take 20 mg by mouth at bedtime.     [provider]  carvedilol (COREG) 25 MG tablet 2 (two) times daily.  12/04/17   [provider]  Cholecalciferol (VITAMIN D) 2000 units CAPS Take 2,000 Units by mouth daily.    [provider]  enoxaparin (LOVENOX) 60 MG/0.6ML injection  12/04/17   [provider]  gabapentin (NEURONTIN) 100 MG capsule Take 1 capsule (100 mg total) by mouth at bedtime. 01/25/19   Lockamy, Randi L, NP-C  lidocaine-prilocaine (EMLA) cream Apply 1 application topically as needed (prior to accessing port). 05/16/17   Holley Bouche, NP  lipase/protease/amylase (CREON) 36000 UNITS CPEP capsule Take 2 capsules with meals and 1 capsule with snacks. 04/25/19   Mahala Menghini, PA-C  loperamide (IMODIUM) 2 MG capsule Take 1-2 tablets by mouth up to 4X/day as needed before meals.  Titrate amount based on stool consistency 11/20/18   [provider]  LORazepam (ATIVAN) 1 MG  tablet Take 1 tablet (1 mg total) by mouth every 8 (eight) hours as needed for anxiety. 05/23/16   Elgergawy, Silver Huguenin, MD  mirtazapine (REMERON) 30 MG tablet TAKE 1/2 (ONE-HALF) TABLET BY MOUTH AT BEDTIME FOR 7 DAYS THEN  INCREASE  TO  1  TABLET  BY  MOUTH  THEREAFTER 11/12/18   Derek Jack, MD  Multiple Vitamin (MULTIVITAMIN WITH MINERALS) TABS tablet Take 1 tablet by mouth daily.    [provider]  Nutritional Supplements (ENSURE PLUS HN) LIQD Take 1 Bottle daily by mouth.     [provider]  ondansetron (ZOFRAN ODT) 4 MG disintegrating tablet Take 1 tablet (4 mg  total) by mouth every 8 (eight) hours as needed. 05/10/18   Fredia Sorrow, MD  pantoprazole (PROTONIX) 40 MG tablet Take 1 tablet (40 mg total) by mouth 2 (two) times daily before a meal. 04/25/19   Mahala Menghini, PA-C  Probiotic Product (PROBIOTIC PO) Take 1 tablet by mouth daily.    [provider]  prochlorperazine (COMPAZINE) 10 MG tablet Take 10 mg by mouth every 6 (six) hours as needed for nausea or vomiting.     [provider]  sucralfate (CARAFATE) 1 GM/10ML suspension Take 10 mLs (1 g total) by mouth 4 (four) times daily as needed. For breakthrough heartburn 04/25/19   Mahala Menghini, PA-C  traMADol (ULTRAM) 50 MG tablet as needed.  04/14/18   [provider]  zoledronic acid (RECLAST) 5 MG/100ML SOLN injection Inject 5 mg into the vein See admin instructions. Pt gets a dose yearly. Due again 2018    [provider]  gabapentin (NEURONTIN) 100 MG capsule Take 1 capsule by mouth at bedtime 12/19/18   Lockamy, Randi L, NP-C    Allergies    Patient has no known allergies.  Review of Systems   Review of Systems  Gastrointestinal: Positive for abdominal pain. Negative for constipation.  All other systems reviewed and are negative.   Physical Exam Updated Vital Signs BP (!) 122/93   Pulse 73   Temp (!) 97.3 F (36.3 C) (Oral)   Resp 18   Ht 5\' 9"  (1.753 m)    Wt 70.3 kg   SpO2 95%   BMI 22.89 kg/m   Physical Exam Vitals and nursing note reviewed.  Constitutional:      Appearance: He is well-developed.  HENT:     Head: Normocephalic and atraumatic.  Eyes:     Conjunctiva/sclera: Conjunctivae normal.  Cardiovascular:     Rate and Rhythm: Normal rate and regular rhythm.     Heart sounds: No murmur.  Pulmonary:     Effort: Pulmonary effort is normal. No respiratory distress.     Breath sounds: Normal breath sounds.  Abdominal:     General: Bowel sounds are decreased.     Palpations: Abdomen is soft.     Tenderness: There is generalized abdominal tenderness.     Comments: Scarred, diffusely tender   Musculoskeletal:     Cervical back: Neck supple.  Skin:    General: Skin is warm and dry.  Neurological:     General: No focal deficit present.     Mental Status: He is alert.  Psychiatric:        Mood and Affect: Mood normal.     ED Results / Procedures / Treatments   Labs (all labs ordered are listed, but only abnormal results are displayed) Labs Reviewed  COMPREHENSIVE METABOLIC PANEL - Abnormal; Notable for the following components:      Result Value   Glucose, Bld 102 (*)    BUN 26 (*)    Creatinine, Ser 1.88 (*)    Calcium 8.7 (*)    AST 70 (*)    ALT 79 (*)    GFR calc non Af Amer 36 (*)    GFR calc Af Amer 42 (*)    All other components within normal limits  LIPASE, BLOOD  CBC  URINALYSIS, ROUTINE W REFLEX MICROSCOPIC    EKG None  Radiology CT ABDOMEN PELVIS W CONTRAST  Result Date: 05/02/2019 CLINICAL DATA:  Abdominal pain, nausea and vomiting. Bowel obstruction suspected. EXAM: CT ABDOMEN AND PELVIS  WITH CONTRAST TECHNIQUE: Multidetector CT imaging of the abdomen and pelvis was performed using the standard protocol following bolus administration of intravenous contrast. CONTRAST:  45mL OMNIPAQUE IOHEXOL 300 MG/ML  SOLN COMPARISON:  CT 05/10/2018, additional priors. FINDINGS: Lower chest: Linear scarring in the  right lower lobe. No acute airspace disease or pleural fluid. Hepatobiliary: Stable tiny hepatic hypodensity in the left lobe, too small to accurately characterize. No new hepatic lesion. Postcholecystectomy with stable biliary dilatation, likely sequela of prior cholecystectomy. Pancreas: No peripancreatic inflammation. Prominent pancreatic duct appears similar to prior, 3 mm proximally. Spleen: No acute findings. No focal abnormality. Upper normal in size. Adrenals/Urinary Tract: Normal adrenal glands. No hydronephrosis or perinephric edema. Homogeneous renal enhancement with symmetric excretion on delayed phase imaging. 5.8 cm cyst in the mid right kidney again seen. Punctate nonobstructing stones in both kidneys versus vascular calcifications. Small low-density lesions in the upper right kidney are too small to characterize. Urinary bladder is physiologically distended without wall thickening. Stomach/Bowel: Bowel evaluation is limited in the absence of enteric contrast. Small hiatal hernia. Partial gastrectomy with hepatic jejunostomy. Post subtotal colectomy with anastomosis in the sigmoid colon. Small bowel loops appear fluid-filled and dilated, however similar to prior exam. No discrete transition point to suggest obstruction. Vascular/Lymphatic: Aortic atherosclerosis. No aortic aneurysm. Portal vein is patent. Few central mesenteric nodes are prominent but not enlarged by size criteria. There is no pelvic adenopathy. Reproductive: Prominent prostate gland spans 5.4 cm. Other: No free air free fluid. No mesenteric edema. No evidence of intra-abdominal abscess. Musculoskeletal: Transitional lumbosacral anatomy. There are no acute or suspicious osseous abnormalities. IMPRESSION: 1. Dilated fluid-filled small bowel in the right abdomen that appears chronic and similar to prior exams, suggesting small bowel dysmotility. No discrete transition point to suggest obstruction. Post subtotal colectomy and partial  gastrectomy with gastrojejunostomy. 2. Additional stable chronic findings as described. Aortic Atherosclerosis (ICD10-I70.0). Electronically Signed   By: Keith Rake M.D.   On: 05/02/2019 20:59    Procedures Procedures (including critical care time)  Medications Ordered in ED Medications  sodium chloride flush (NS) 0.9 % injection 3 mL (has no administration in time range)  HYDROmorphone (DILAUDID) injection 1 mg (1 mg Intravenous Given 05/02/19 1956)  ondansetron (ZOFRAN) injection 4 mg (4 mg Intravenous Given 05/02/19 1956)  iohexol (OMNIPAQUE) 300 MG/ML solution 75 mL (75 mLs Intravenous Contrast Given 05/02/19 2031)    ED Course  I have reviewed the triage vital signs and the nursing notes.  Pertinent labs & imaging results that were available during my care of the patient were reviewed by me and considered in my medical decision making (see chart for details).  MDM: I suspect early small bowel obstruction.  Rn has tried ng tube and is unable to pass into stomach.    Hospitalist consulted.    MDM Rules/Calculators/A&P                      Final Clinical Impression(s) / ED Diagnoses Final diagnoses:  Small bowel obstruction [facility])    Rx / DC Orders ED Discharge Orders    None       Sidney Ace 05/02/19 2343    Nat Christen, MD 05/03/19 [phone]

## 2019-05-02 NOTE — ED Triage Notes (Signed)
Patient reports abdominal pain with nausea and vomiting that started on Tuesday morning. L sided upper abdominal pain.

## 2019-05-03 DIAGNOSIS — K2100 Gastro-esophageal reflux disease with esophagitis, without bleeding: Secondary | ICD-10-CM | POA: Diagnosis present

## 2019-05-03 DIAGNOSIS — Z8673 Personal history of transient ischemic attack (TIA), and cerebral infarction without residual deficits: Secondary | ICD-10-CM | POA: Diagnosis not present

## 2019-05-03 DIAGNOSIS — E785 Hyperlipidemia, unspecified: Secondary | ICD-10-CM | POA: Diagnosis present

## 2019-05-03 DIAGNOSIS — K56609 Unspecified intestinal obstruction, unspecified as to partial versus complete obstruction: Secondary | ICD-10-CM | POA: Diagnosis present

## 2019-05-03 DIAGNOSIS — E538 Deficiency of other specified B group vitamins: Secondary | ICD-10-CM | POA: Diagnosis present

## 2019-05-03 DIAGNOSIS — N179 Acute kidney failure, unspecified: Secondary | ICD-10-CM | POA: Diagnosis present

## 2019-05-03 DIAGNOSIS — F419 Anxiety disorder, unspecified: Secondary | ICD-10-CM

## 2019-05-03 DIAGNOSIS — G8929 Other chronic pain: Secondary | ICD-10-CM | POA: Diagnosis present

## 2019-05-03 DIAGNOSIS — Z79891 Long term (current) use of opiate analgesic: Secondary | ICD-10-CM | POA: Diagnosis not present

## 2019-05-03 DIAGNOSIS — R1012 Left upper quadrant pain: Secondary | ICD-10-CM | POA: Diagnosis not present

## 2019-05-03 DIAGNOSIS — R680 Hypothermia, not associated with low environmental temperature: Secondary | ICD-10-CM | POA: Diagnosis present

## 2019-05-03 DIAGNOSIS — Z903 Acquired absence of stomach [part of]: Secondary | ICD-10-CM | POA: Diagnosis not present

## 2019-05-03 DIAGNOSIS — I10 Essential (primary) hypertension: Secondary | ICD-10-CM | POA: Diagnosis present

## 2019-05-03 DIAGNOSIS — Z87891 Personal history of nicotine dependence: Secondary | ICD-10-CM | POA: Diagnosis not present

## 2019-05-03 DIAGNOSIS — N170 Acute kidney failure with tubular necrosis: Secondary | ICD-10-CM | POA: Diagnosis not present

## 2019-05-03 DIAGNOSIS — K8689 Other specified diseases of pancreas: Secondary | ICD-10-CM | POA: Diagnosis present

## 2019-05-03 DIAGNOSIS — R7401 Elevation of levels of liver transaminase levels: Secondary | ICD-10-CM | POA: Diagnosis present

## 2019-05-03 DIAGNOSIS — Z9049 Acquired absence of other specified parts of digestive tract: Secondary | ICD-10-CM | POA: Diagnosis not present

## 2019-05-03 DIAGNOSIS — K567 Ileus, unspecified: Secondary | ICD-10-CM | POA: Diagnosis present

## 2019-05-03 DIAGNOSIS — K56600 Partial intestinal obstruction, unspecified as to cause: Secondary | ICD-10-CM | POA: Diagnosis present

## 2019-05-03 DIAGNOSIS — Z20822 Contact with and (suspected) exposure to covid-19: Secondary | ICD-10-CM | POA: Diagnosis present

## 2019-05-03 DIAGNOSIS — G40909 Epilepsy, unspecified, not intractable, without status epilepticus: Secondary | ICD-10-CM | POA: Diagnosis present

## 2019-05-03 DIAGNOSIS — M810 Age-related osteoporosis without current pathological fracture: Secondary | ICD-10-CM | POA: Diagnosis present

## 2019-05-03 DIAGNOSIS — Z85038 Personal history of other malignant neoplasm of large intestine: Secondary | ICD-10-CM | POA: Diagnosis not present

## 2019-05-03 DIAGNOSIS — R112 Nausea with vomiting, unspecified: Secondary | ICD-10-CM

## 2019-05-03 DIAGNOSIS — Z86711 Personal history of pulmonary embolism: Secondary | ICD-10-CM | POA: Diagnosis not present

## 2019-05-03 DIAGNOSIS — Z8249 Family history of ischemic heart disease and other diseases of the circulatory system: Secondary | ICD-10-CM | POA: Diagnosis not present

## 2019-05-03 LAB — GLUCOSE, CAPILLARY
Glucose-Capillary: 104 mg/dL — ABNORMAL HIGH (ref 70–99)
Glucose-Capillary: 97 mg/dL (ref 70–99)
Glucose-Capillary: 95 mg/dL (ref 70–99)

## 2019-05-03 LAB — COMPREHENSIVE METABOLIC PANEL
ALT: 59 U/L — ABNORMAL HIGH (ref 0–44)
AST: 38 U/L (ref 15–41)
Albumin: 3.4 g/dL — ABNORMAL LOW (ref 3.5–5.0)
Alkaline Phosphatase: 55 U/L (ref 38–126)
Anion gap: 6 (ref 5–15)
BUN: 22 mg/dL (ref 8–23)
CO2: 23 mmol/L (ref 22–32)
Calcium: 8 mg/dL — ABNORMAL LOW (ref 8.9–10.3)
Chloride: 110 mmol/L (ref 98–111)
Creatinine, Ser: 1.57 mg/dL — ABNORMAL HIGH (ref 0.61–1.24)
GFR calc Af Amer: 52 mL/min — ABNORMAL LOW (ref >60)
GFR calc non Af Amer: 45 mL/min — ABNORMAL LOW (ref >60)
Glucose, Bld: 110 mg/dL — ABNORMAL HIGH (ref 70–99)
Potassium: 3.9 mmol/L (ref 3.5–5.1)
Sodium: 139 mmol/L (ref 135–145)
Total Bilirubin: 0.6 mg/dL (ref 0.3–1.2)
Total Protein: 6.1 g/dL — ABNORMAL LOW (ref 6.5–8.1)

## 2019-05-03 LAB — SARS CORONAVIRUS 2 (TAT 6-24 HRS): SARS Coronavirus 2: NEGATIVE

## 2019-05-03 LAB — HIV ANTIBODY (ROUTINE TESTING W REFLEX): HIV Screen 4th Generation wRfx: NONREACTIVE

## 2019-05-03 MED ORDER — SODIUM CHLORIDE 0.9 % IV SOLN: Freq: Once | INTRAVENOUS | Status: AC

## 2019-05-03 MED ORDER — SENNOSIDES-DOCUSATE SODIUM 8.6-50 MG PO TABS: 2.0000 | ORAL_TABLET | Freq: Every evening | ORAL | Status: DC | PRN

## 2019-05-03 MED ORDER — DEXTROSE-NACL 5-0.45 % IV SOLN: INTRAVENOUS | Status: AC

## 2019-05-03 MED ORDER — MORPHINE SULFATE (PF) 2 MG/ML IV SOLN
2.0000 mg | INTRAVENOUS | Status: DC | PRN
  Administered 2019-05-03 – 2019-05-04 (×7): 2 mg via INTRAVENOUS
  Filled 2019-05-03 (×7): qty 1

## 2019-05-03 MED ORDER — CHLORHEXIDINE GLUCONATE CLOTH 2 % EX PADS
6.0000 | MEDICATED_PAD | Freq: Every day | CUTANEOUS | Status: DC
  Administered 2019-05-03 – 2019-05-04 (×2): 6 via TOPICAL

## 2019-05-03 MED ORDER — PANTOPRAZOLE SODIUM 40 MG IV SOLR
40.0000 mg | INTRAVENOUS | Status: DC
  Administered 2019-05-03 – 2019-05-06 (×4): 40 mg via INTRAVENOUS
  Filled 2019-05-03 (×4): qty 40

## 2019-05-03 MED ORDER — ENOXAPARIN SODIUM 40 MG/0.4ML ~~LOC~~ SOLN
40.0000 mg | SUBCUTANEOUS | Status: DC
  Administered 2019-05-03 – 2019-05-06 (×4): 40 mg via SUBCUTANEOUS
  Filled 2019-05-03 (×4): qty 0.4

## 2019-05-03 MED ORDER — ONDANSETRON HCL 4 MG/2ML IJ SOLN
4.0000 mg | Freq: Four times a day (QID) | INTRAMUSCULAR | Status: DC | PRN
  Administered 2019-05-03 – 2019-05-04 (×8): 4 mg via INTRAVENOUS
  Filled 2019-05-03 (×8): qty 2

## 2019-05-03 MED ORDER — POLYETHYLENE GLYCOL 3350 17 G PO PACK: 17.0000 g | PACK | Freq: Every day | ORAL | Status: DC | PRN

## 2019-05-03 NOTE — Progress Notes (Signed)
Patient admitted this morning with concerns of ileus/small bowel obstruction and abdominal pain.  Difficult to place NG tube in the ER. Seen and examined by me at bedside this morning he reported of mild nausea but denied any vomiting or other complaints.  He is passing gas at the moment.  Vital signs are stable.  On physical exam he is clear to auscultation bilaterally, normal sinus rhythm.  Multiple abdominal surgical scars, positive bowel sounds.  Abdomen is nontender nondistended.  CT of the abdomen pelvis reviewed  At this time we will proceed with conservative management.  Explained him to be out of bed to chair, ambulate as much as possible, clear liquid with some ice chips as tolerated.  Accu-Cheks every 6 hours, IV fluids.  If he does not progress well and starts feeling worse, he may require NG tube placement and surgical consult at that point.  Discussed his care with RN.  Time spent-20 minutes  Gerlean Ren MD [facility]

## 2019-05-03 NOTE — H&P (Signed)
History and Physical  [name] FYB:[id number] DOB: 1951/02/21 DOA: 05/02/2019  Referring physician: Caryl Ada PA-C PCP: [name], MD  Patient coming from: Home   Chief Complaint: Abdominal pain  HPI: [name] is a 69 y.o. male with medical history significant for hypertension, hyperlipidemia, pancreatic insufficiency, anxiety, GERD, B12 deficiency, hemigastrectomy (in the [employer] for benign tumor), numerous small bowel resections or abdominal adhesions and bowel obstruction and subtotal colectomy for colon cancer, postprandial abdominal distention, nausea and early satiety who presents to the emergency department due to abdominal pain that started on Tuesday morning (2/9).  Pain was in left upper quadrant, it was rated as 7/10 on pain scale and was associated with nausea and vomiting. He denies fever, chills, chest pain, shortness of breath, headache, numbness or tingling.  ED Course:  In the emergency department, patient was initially hypothermic with a temperature of  96.4F, BP was 143/82, but other vital signs are within normal range.  Work-up in the ED showed normal CBC and BMP except for elevated BUN to creatinine 26/1.88 (baseline creatinine at 1.4-1.5), hypocalcemia and elevated transaminitis.  Urinalysis was negative.  Chest x-ray showed no acute cardiopulmonary disease.  CT abdomen and pelvis with contrast showed dilated fluid-filled small bowel in the right abdomen that appears chronic and similar to prior exams, suggesting small bowel dysmotility with no discrete transition point to suggest obstruction.  He was treated with IV Dilaudid and Zofran.  NG tube was attempted twice in the ED without success. Hospitalist was asked to admit for further evaluation and management.  Review of Systems: Constitutional: Negative for chills and fever.  HENT: Negative for ear pain and sore throat.   Eyes: Negative for pain and visual disturbance.  Respiratory: Negative for  cough, chest tightness and shortness of breath.   Cardiovascular: Negative for chest pain and palpitations.  Gastrointestinal: Positive for abdominal pain, nausea and vomiting.  Negative for constipation.  Endocrine: Negative for polyphagia and polyuria.  Genitourinary: Negative for decreased urine volume, dysuria Musculoskeletal: Negative for arthralgias and back pain.  Skin: Negative for color change and rash.  Allergic/Immunologic: Negative for immunocompromised state.  Neurological: Negative for tremors, syncope, speech difficulty, weakness, light-headedness and headaches.  Hematological: Does not bruise/bleed easily.  All other systems reviewed and are negative   Past Medical History:  Diagnosis Date  . 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 Anemia   . Anxiety   . Arthritis   . Barrett's esophagus   . Blood transfusion   . Bowel obstruction ([place]) 05/13/2012   Recurrent  . Bronchitis   . Chest pain at rest   . Chronic abdominal pain   . Erosive esophagitis   . ETOH abuse    quit 03/2010  . GERD (gastroesophageal reflux disease)   . Hx of Clostridium difficile infection 01/2012  . Hypertension   . Ileus ([place])   . Iron deficiency anemia 03/23/2016  . Obstruction of bowel ([place]) 03/03/14  . Osteoporosis   . Personal history of PE (pulmonary embolism) 10/01/2010  . Pneumonia   . Pulmonary embolism ([place]) 02/2010  . Recurrent upper respiratory infection (URI)   . Renal disorder   . S/P endoscopy September 28, 2010   erosive reflux esophagitis, Billroth I anatomy  . 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 Seizures ([place])   . Shortness of breath   . TIA (transient ischemic attack) 10/11  . Vitamin B12 deficiency    Past Surgical History:  Procedure Laterality Date  . ABDOMINAL ADHESION SURGERY  03/04/15   @ [facility]  . ABDOMINAL EXPLORATION SURGERY    . abdominal sugery     for bowel obstruction x 8, all in 1980s, except for one in 07/2010  . APPENDECTOMY  1980s   . Billroth 1 hemigastrectomy  1980s   per patient for benign duodenal tumor  . CARDIAC CATHETERIZATION  07/17/2012  . CHOLECYSTECTOMY  1980s  . COLON SURGERY  May 2012   left hemicolectomy, colon cancer found at time of surgery for bowel obstruction  . COLON SURGERY  10/12/2018   At [facility]: Resection of right colon and terminal ileum with creation of ileorectal anastomosis for large bowel obstruction.  Cecum and ascending colon noted to be attached to the rectum.  . COLONOSCOPY  03/18/2011   anastomosis at 35cm. Several adenomatous polyps removed. Sigmoid diverticulosis. Next TCS 02/2013  . COLONOSCOPY N/A 07/24/2012   OFB:PZWCHE post segmental resection with normal-appearing colonic anastomosis aside from an adjacent polyp-removed as described above. Rectal polyp-removed as described above. CT findings appear to have been artifactual. tubular adenomas/prolapsed type polyp.  . COLONOSCOPY N/A 05/15/2015   Procedure: COLONOSCOPY;  Surgeon: [name], MD;  [facility];  Service: Endoscopy;  Laterality: N/A;  . COLONOSCOPY  09/26/2018   at [facility]: prep not adequate for colon cancer surveillance.  Prior end-to-end colonic anastomosis in the rectosigmoid region.  This was patent and characterized by healthy-appearing mucosa.  Anastomosis was traversed.  Normal terminal ileum.  . COLONOSCOPY WITH PROPOFOL N/A 11/04/2016   Dr. [name], status post subtotal colectomy with normal-appearing residual lower GI tract.  Next colonoscopy in 5 years.  . ESOPHAGOGASTRODUODENOSCOPY  09/28/2010  . ESOPHAGOGASTRODUODENOSCOPY  12/01/2010   Cervical web status post dilation, erosive esophagitis, B1 hemigastrectomy, inflamed anastomosis  . ESOPHAGOGASTRODUODENOSCOPY  04/16/2011   excoriation at [facility] c/w trauma/M-W tear, friable gastric anastomosis, dilation efferent limb  . ESOPHAGOGASTRODUODENOSCOPY N/A 06/03/2014   Dr.[name]- cervcal esopphageal web s/p dilation. abnormal distal esophagus bx= barretts esophagus   . ESOPHAGOGASTRODUODENOSCOPY N/A 05/15/2015   Procedure: ESOPHAGOGASTRODUODENOSCOPY (EGD);  Surgeon: [name], MD;  [facility];  Service: Endoscopy;  Laterality: N/A;  230  . ESOPHAGOGASTRODUODENOSCOPY (EGD) WITH ESOPHAGEAL DILATION  02/25/2012   NID:[id number] esophageal web-s/p dilation anddisruption as described above. Status post prior gastric with Billroth I configuration. Abnormal gastric mucosa at the anastomosis. Gastric biopsy showed mild chronic inflammation but no H. pylori   . ESOPHAGOGASTRODUODENOSCOPY (EGD) WITH PROPOFOL N/A 11/04/2016   Dr. [name]: Reflux esophagitis, small hiatal hernia status post hemigastrectomy.  Single patent efferent small bowel limb appeared normal, 2 x 2 centimeter tongue of salmon epithelium again seen, esophageal dilation.  Biopsies consistent with reflux changes, not Barrett's however this was confirmed on prior EGDs.  Offer 3-year follow-up EGD August 2021.  [name] HERNIA REPAIR     right inguinal  . MALONEY DILATION N/A 06/03/2014   Procedure: [name] DILATION;  Surgeon: [name], MD;  [facility];  Service: Endoscopy;  Laterality: N/A;  [name] DILATION N/A 05/15/2015   Procedure: [name] DILATION;  Surgeon: [name], MD;  [facility];  Service: Endoscopy;  Laterality: N/A;  . [name] DILATION N/A 11/04/2016   Procedure: [name] DILATION;  Surgeon: [name], MD;  [facility];  Service: Endoscopy;  Laterality: N/A;  . PORTACATH PLACEMENT    . SAVORY DILATION N/A 06/03/2014   Procedure: SAVORY DILATION;  Surgeon: [name],  MD;  [facility];  Service: Endoscopy;  Laterality: N/A;    Social History:  reports that he quit smoking about 6 years ago. His smoking use included cigarettes. He has a 20.00 pack-year smoking history. He has never used smokeless tobacco. He reports that he does not drink alcohol or use drugs.   No Known Allergies  Family History  Problem Relation  Age of Onset  . Hypertension Mother   . Arthritis Mother   . Pneumonia Mother   . Hypertension Father   . Heart attack Father   . Colon cancer Neg Hx      Prior to Admission medications   Medication Sig Start Date End Date Taking? Authorizing Provider  acetaminophen (TYLENOL) 325 MG tablet TAKE 3 TABLETS BY MOUTH EVERY 6 HOURS AS NEEDED FOR UP TO 10 DAYS 10/28/18   [provider]  ammonium lactate (AMLACTIN) 12 % cream APPLY CREAM TO DRY SKIN ON LEGS ARMS HANDS AND BACK AREA ONCE TO TWICE DAILY (AVOID FACE GROIN UNDERARMS) 11/22/17   [provider]  atorvastatin (LIPITOR) 20 MG tablet Take 20 mg by mouth at bedtime.     [provider]  carvedilol (COREG) 25 MG tablet 2 (two) times daily.  12/04/17   [provider]  Cholecalciferol (VITAMIN D) 2000 units CAPS Take 2,000 Units by mouth daily.    [provider]  enoxaparin (LOVENOX) 60 MG/0.6ML injection  12/04/17   [provider]  gabapentin (NEURONTIN) 100 MG capsule Take 1 capsule (100 mg total) by mouth at bedtime. 01/25/19   Lockamy, Randi L, NP-C  lidocaine-prilocaine (EMLA) cream Apply 1 application topically as needed (prior to accessing port). 05/16/17   Holley Bouche, NP  lipase/protease/amylase (CREON) 36000 UNITS CPEP capsule Take 2 capsules with meals and 1 capsule with snacks. 04/25/19   Mahala Menghini, PA-C  loperamide (IMODIUM) 2 MG capsule Take 1-2 tablets by mouth up to 4X/day as needed before meals.  Titrate amount based on stool consistency 11/20/18   [provider]  LORazepam (ATIVAN) 1 MG tablet Take 1 tablet (1 mg total) by mouth every 8 (eight) hours as needed for anxiety. 05/23/16   Elgergawy, Silver Huguenin, MD  mirtazapine (REMERON) 30 MG tablet TAKE 1/2 (ONE-HALF) TABLET BY MOUTH AT BEDTIME FOR 7 DAYS THEN  INCREASE  TO  1  TABLET  BY  MOUTH  THEREAFTER 11/12/18   Derek Jack, MD  Multiple Vitamin (MULTIVITAMIN WITH MINERALS) TABS tablet Take 1 tablet by  mouth daily.    [provider]  Nutritional Supplements (ENSURE PLUS HN) LIQD Take 1 Bottle daily by mouth.     [provider]  ondansetron (ZOFRAN ODT) 4 MG disintegrating tablet Take 1 tablet (4 mg total) by mouth every 8 (eight) hours as needed. 05/10/18   Fredia Sorrow, MD  pantoprazole (PROTONIX) 40 MG tablet Take 1 tablet (40 mg total) by mouth 2 (two) times daily before a meal. 04/25/19   Mahala Menghini, PA-C  Probiotic Product (PROBIOTIC PO) Take 1 tablet by mouth daily.    [provider]  prochlorperazine (COMPAZINE) 10 MG tablet Take 10 mg by mouth every 6 (six) hours as needed for nausea or vomiting.     [provider]  sucralfate (CARAFATE) 1 GM/10ML suspension Take 10 mLs (1 g total) by mouth 4 (four) times daily as needed. For breakthrough heartburn 04/25/19   Mahala Menghini, PA-C  traMADol (ULTRAM) 50 MG tablet as needed.  04/14/18  [provider]  zoledronic acid (RECLAST) 5 MG/100ML SOLN injection Inject 5 mg into the vein See admin instructions. Pt gets a dose yearly. Due again 2018    [provider]  gabapentin (NEURONTIN) 100 MG capsule Take 1 capsule by mouth at bedtime 12/19/18   Glennie Isle, NP-C    Physical Exam: BP 131/88 (BP Location: Left Arm)   Pulse 60   Temp 97.7 F (36.5 C) (Oral)   Resp 16   Ht 5\' 9"  (1.753 m)   Wt 70.3 kg   SpO2 98%   BMI 22.89 kg/m   . General: 69 y.o. year-old male well developed well nourished in no acute distress.  Alert and oriented x3. [name] HEENT: Normocephalic, atraumatic . Neck: Supple, trachea medial . Cardiovascular: Regular rate and rhythm with no rubs or gallops.  No thyromegaly or JVD noted.  No lower extremity edema. 2/4 pulses in all 4 extremities. [name] Respiratory: Clear to auscultation with no wheezes or rales. Good inspiratory effort. . Abdomen: Soft, decreased bowel sounds.  Mild tenderness to palpation, worse in LUQ.  . Muskuloskeletal: No cyanosis, clubbing  or edema noted bilaterally . Neuro: CN II-XII intact, strength, sensation, reflexes . Skin: No ulcerative lesions noted or rashes . Psychiatry: Judgement and insight appear normal. Mood is appropriate for condition and setting          Labs on Admission:  Basic Metabolic Panel: Recent Labs  Lab 05/02/19 1723 05/03/19 0413  NA 140 139  K 4.6 3.9  CL 106 110  CO2 25 23  GLUCOSE 102* 110*  BUN 26* 22  CREATININE 1.88* 1.57*  CALCIUM 8.7* 8.0*   Liver Function Tests: Recent Labs  Lab 05/02/19 1723 05/03/19 0413  AST 70* 38  ALT 79* 59*  ALKPHOS 66 55  BILITOT 0.6 0.6  PROT 6.9 6.1*  ALBUMIN 3.7 3.4*   Recent Labs  Lab 05/02/19 1723  LIPASE 51   No results for input(s): AMMONIA in the last 168 hours. CBC: Recent Labs  Lab 05/02/19 1723  WBC 9.9  HGB 15.9  HCT 49.6  MCV 95.6  PLT 154   Cardiac Enzymes: No results for input(s): CKTOTAL, CKMB, CKMBINDEX, TROPONINI in the last 168 hours.  BNP (last 3 results) No results for input(s): BNP in the last 8760 hours.  ProBNP (last 3 results) No results for input(s): PROBNP in the last 8760 hours.  CBG: No results for input(s): GLUCAP in the last 168 hours.  Radiological Exams on Admission: CT ABDOMEN PELVIS W CONTRAST  Result Date: 05/02/2019 CLINICAL DATA:  Abdominal pain, nausea and vomiting. Bowel obstruction suspected. EXAM: CT ABDOMEN AND PELVIS WITH CONTRAST TECHNIQUE: Multidetector CT imaging of the abdomen and pelvis was performed using the standard protocol following bolus administration of intravenous contrast. CONTRAST:  20mL OMNIPAQUE IOHEXOL 300 MG/ML  SOLN COMPARISON:  CT 05/10/2018, additional priors. FINDINGS: Lower chest: Linear scarring in the right lower lobe. No acute airspace disease or pleural fluid. Hepatobiliary: Stable tiny hepatic hypodensity in the left lobe, too small to accurately characterize. No new hepatic lesion. Postcholecystectomy with stable biliary dilatation, likely sequela of  prior cholecystectomy. Pancreas: No peripancreatic inflammation. Prominent pancreatic duct appears similar to prior, 3 mm proximally. Spleen: No acute findings. No focal abnormality. Upper normal in size. Adrenals/Urinary Tract: Normal adrenal glands. No hydronephrosis or perinephric edema. Homogeneous renal enhancement with symmetric excretion on delayed phase imaging. 5.8 cm cyst in the mid right kidney again seen. Punctate nonobstructing stones in both kidneys  versus vascular calcifications. Small low-density lesions in the upper right kidney are too small to characterize. Urinary bladder is physiologically distended without wall thickening. Stomach/Bowel: Bowel evaluation is limited in the absence of enteric contrast. Small hiatal hernia. Partial gastrectomy with hepatic jejunostomy. Post subtotal colectomy with anastomosis in the sigmoid colon. Small bowel loops appear fluid-filled and dilated, however similar to prior exam. No discrete transition point to suggest obstruction. Vascular/Lymphatic: Aortic atherosclerosis. No aortic aneurysm. Portal vein is patent. Few central mesenteric nodes are prominent but not enlarged by size criteria. There is no pelvic adenopathy. Reproductive: Prominent prostate gland spans 5.4 cm. Other: No free air free fluid. No mesenteric edema. No evidence of intra-abdominal abscess. Musculoskeletal: Transitional lumbosacral anatomy. There are no acute or suspicious osseous abnormalities. IMPRESSION: 1. Dilated fluid-filled small bowel in the right abdomen that appears chronic and similar to prior exams, suggesting small bowel dysmotility. No discrete transition point to suggest obstruction. Post subtotal colectomy and partial gastrectomy with gastrojejunostomy. 2. Additional stable chronic findings as described. Aortic Atherosclerosis (ICD10-I70.0). Electronically Signed   By: Keith Rake M.D.   On: 05/02/2019 20:59   DG Chest Portable 1 View  Result Date:  05/02/2019 CLINICAL DATA:  NG tube placement EXAM: PORTABLE CHEST 1 VIEW COMPARISON:  04/01/2018 FINDINGS: Left Port-A-Cath remains in place, unchanged. NG tube coils in the cervical region, partially imaged on the superior aspect of the image. Heart is normal size. No confluent airspace opacities or effusions. No acute bony abnormality. IMPRESSION: NG tube coils in the cervical region. This should be removed completely and replaced. No acute cardiopulmonary disease. Electronically Signed   By: Rolm Baptise M.D.   On: 05/02/2019 22:53   DG Chest Port 1V same Day  Result Date: 05/02/2019 CLINICAL DATA:  Nasogastric repositioning. EXAM: PORTABLE CHEST 1 VIEW COMPARISON:  05/02/2019 FINDINGS: Tube remains coiled in the hypopharynx or upper cervical esophagus, not extending below the thoracic inlet. Lungs remain clear. No other change. IMPRESSION: Tube remains coiled in the neck. Electronically Signed   By: [name] Chimes M.D.   On: 05/02/2019 22:59    EKG: I independently viewed the EKG done and my findings are as followed: EKG was not done in the ED  Assessment/Plan Present on Admission: . Abdominal pain . HTN (hypertension) . Nausea and vomiting . Esophageal reflux disease . LUQ pain . Acute renal failure ([place]) . History of colon cancer, stage III . Ileus ([place])  Principal Problem:   Abdominal pain Active Problems:   Nausea and vomiting   Esophageal reflux disease   LUQ pain   HTN (hypertension)   Acute renal failure ([facility])   History of colon cancer, stage III   Ileus ([facility])   Pancreatic insufficiency   Dyslipidemia   B12 deficiency   Anxiety   Intractable abdominal pain ( LUQ Pain), nausea and vomiting in the setting of possible ileus Patient will be admitted to [facility] Continue IV NS at 75 mLs/Hr Continue IV morphine 2 mg q.4h p.r.n. for moderate to severe pain Continue IV Zofran p.r.n. Continue n.p.o. at this time with plan to advance diet as tolerated NG tube placement attempted  twice in the ED without success, consider IR consult for placement if still clinically indicated  Consider surgery consult for worsening abdominal pain  GERD Continue Protonix  Acute kidney injury Creatinine on admission= elevated BUN to creatinine 26/1.88 (baseline creatinine at 1.4-1.5)    Renally adjust medications, avoid nephrotoxic agents/dehydration/hypotension Continue IV hydration  History of colon cancer The patient  has history of stage III colon cancer status post surgical resection and chemotherapy Continue to monitor  Essential hypertension (controlled) Resume home meds when patient is able to tolerate oral intake  Dyslipidemia Resume atorvastatin when patient is able to tolerate oral intake  Pancreatic insufficiency Creon will be continued once patient is able to tolerate oral intake.  B12 deficiency/Anxiety Home meds will be resumed once patient is able to tolerate oral intake  DVT prophylaxis: Lovenox  Code Status: Full code  Family Communication: None at bedside  Disposition Plan: Plan to discharge patient home within next 24-48 hours pending clinical improvement  Consults called: None  Admission status: Observation    Bernadette Hoit MD [facility]  If 7PM-7AM, please contact night-coverage [email or web address]  05/03/2019, 5:26 AM

## 2019-05-04 DIAGNOSIS — F419 Anxiety disorder, unspecified: Secondary | ICD-10-CM

## 2019-05-04 DIAGNOSIS — E538 Deficiency of other specified B group vitamins: Secondary | ICD-10-CM

## 2019-05-04 DIAGNOSIS — K219 Gastro-esophageal reflux disease without esophagitis: Secondary | ICD-10-CM

## 2019-05-04 DIAGNOSIS — E785 Hyperlipidemia, unspecified: Secondary | ICD-10-CM

## 2019-05-04 LAB — COMPREHENSIVE METABOLIC PANEL
ALT: 37 U/L (ref 0–44)
AST: 22 U/L (ref 15–41)
Albumin: 3.1 g/dL — ABNORMAL LOW (ref 3.5–5.0)
Alkaline Phosphatase: 44 U/L (ref 38–126)
Anion gap: 8 (ref 5–15)
BUN: 15 mg/dL (ref 8–23)
CO2: 23 mmol/L (ref 22–32)
Calcium: 7.7 mg/dL — ABNORMAL LOW (ref 8.9–10.3)
Chloride: 107 mmol/L (ref 98–111)
Creatinine, Ser: 1.41 mg/dL — ABNORMAL HIGH (ref 0.61–1.24)
GFR calc Af Amer: 59 mL/min — ABNORMAL LOW (ref >60)
GFR calc non Af Amer: 51 mL/min — ABNORMAL LOW (ref >60)
Glucose, Bld: 115 mg/dL — ABNORMAL HIGH (ref 70–99)
Potassium: 3.7 mmol/L (ref 3.5–5.1)
Sodium: 138 mmol/L (ref 135–145)
Total Bilirubin: 0.6 mg/dL (ref 0.3–1.2)
Total Protein: 5.5 g/dL — ABNORMAL LOW (ref 6.5–8.1)

## 2019-05-04 LAB — CBC
HCT: 40.6 % (ref 39.0–52.0)
Hemoglobin: 13.1 g/dL (ref 13.0–17.0)
MCH: 30.5 pg (ref 26.0–34.0)
MCHC: 32.3 g/dL (ref 30.0–36.0)
MCV: 94.6 fL (ref 80.0–100.0)
Platelets: 121 10*3/uL — ABNORMAL LOW (ref 150–400)
RBC: 4.29 MIL/uL (ref 4.22–5.81)
RDW: 13.9 % (ref 11.5–15.5)
WBC: 7.6 10*3/uL (ref 4.0–10.5)
nRBC: 0 % (ref 0.0–0.2)

## 2019-05-04 LAB — GLUCOSE, CAPILLARY
Glucose-Capillary: 101 mg/dL — ABNORMAL HIGH (ref 70–99)
Glucose-Capillary: 101 mg/dL — ABNORMAL HIGH (ref 70–99)
Glucose-Capillary: 98 mg/dL (ref 70–99)
Glucose-Capillary: 89 mg/dL (ref 70–99)

## 2019-05-04 LAB — MAGNESIUM: Magnesium: 1.8 mg/dL (ref 1.7–2.4)

## 2019-05-04 NOTE — Progress Notes (Signed)
Clear liquid diet tolerated well per patient. Denies increase in abdominal pain, nausea or discomfort.

## 2019-05-04 NOTE — Progress Notes (Signed)
PROGRESS NOTE    [name]  OBS:[id number] DOB: Oct 20, 1950 DOA: 05/02/2019 PCP: [name], MD   Brief Narrative:  69 year old with history of HTN, HLD, pancreatic insufficiency, anxiety, GERD, B12 deficiency, hemigastrectomy, numerous small bowel resections with abdominal ideation, subtotal colectomy presents to the hospital with nausea and vomiting.  Admitted to the hospital with concerns of possible ileus.  Slowly advancing his diet.   Assessment & Plan:   Principal Problem:   Abdominal pain Active Problems:   Nausea and vomiting   Esophageal reflux disease   LUQ pain   HTN (hypertension)   Acute renal failure (HCC)   History of colon cancer, stage III   Ileus (HCC)   Pancreatic insufficiency   Dyslipidemia   B12 deficiency   Anxiety  Abdominal discomfort with nausea, improved History of multiple abdominal surgeries -CT of the abdomen pelvis no obvious small bowel obstruction, suspect may be mild ileus but no acute pathology seen.  Plan is to slowly advance his diet today from clears to full to soft.  In the meantime continue to provide supportive care.  If he gets nauseous or starts vomiting, will need NG tube placement. -Currently passing gas, recommend out of bed to chair and move around -IV fluids.  GERD -PPI  History of colon cancer -Status post resection and chemotherapy.  Pancreatic insufficiency -We can resume Creon once he is able to fully tolerate oral diet  B12 deficiency and anxiety -Resume home meds when he can consistently take p.o. medications.    DVT prophylaxis: Lovenox Code Status: Full code Family Communication: None Disposition Plan:   Patient From= home  Patient Anticipated D/C place= Home Home  Barriers= patient is still feeling nauseous.  Has had multiple abdominal surgeries in the past, plans to very slowly advance his diet today.  If he is able to tolerate enough and no longer nauseous by tomorrow he can be  discharged.    Subjective: Passing gas but still feeling very nauseous.  No more vomiting.  Review of Systems Otherwise negative except as per HPI, including: General: Denies fever, chills, night sweats or unintended weight loss. Resp: Denies cough, wheezing, shortness of breath. Cardiac: Denies chest pain, palpitations, orthopnea, paroxysmal nocturnal dyspnea. GI: Denies abdominal pain, vomiting, diarrhea or constipation GU: Denies dysuria, frequency, hesitancy or incontinence MS: Denies muscle aches, joint pain or swelling Neuro: Denies headache, neurologic deficits (focal weakness, numbness, tingling), abnormal gait Psych: Denies anxiety, depression, SI/HI/AVH Skin: Denies new rashes or lesions ID: Denies sick contacts, exotic exposures, travel  Examination:  General exam: Appears calm and comfortable  Respiratory system: Clear to auscultation. Respiratory effort normal. Cardiovascular system: S1 & S2 heard, RRR. No JVD, murmurs, rubs, gallops or clicks. No pedal edema. Gastrointestinal system: Abdomen is still a little less distended, soft.  Positive bowel sounds. Central nervous system: Alert and oriented. No focal neurological deficits. Extremities: Symmetric 5 x 5 power. Skin: Multiple surgical scars on the abdomen noted. Psychiatry: Judgement and insight appear normal. Mood & affect appropriate.     Objective: Vitals:   05/03/19 1528 05/03/19 2256 05/04/19 0602 05/04/19 0901  BP:  118/72 97/76   Pulse:  61 63   Resp:  18 18   Temp:  97.7 F (36.5 C) 98.1 F (36.7 C)   TempSrc:  Oral Oral   SpO2: 96% 95% 96% 95%  Weight:      Height:        Intake/Output Summary (Last 24 hours) at 05/04/2019 1150 Last data filed  at 05/04/2019 0335 Gross per 24 hour  Intake 1217.03 ml  Output --  Net 1217.03 ml   Filed Weights   05/02/19 1705  Weight: 70.3 kg     Data Reviewed:   CBC: Recent Labs  Lab 05/02/19 1723 05/04/19 0407  WBC 9.9 7.6  HGB 15.9 13.1   HCT 49.6 40.6  MCV 95.6 94.6  PLT 154 174*   Basic Metabolic Panel: Recent Labs  Lab 05/02/19 1723 05/03/19 0413 05/04/19 0407  NA 140 139 138  K 4.6 3.9 3.7  CL 106 110 107  CO2 25 23 23   GLUCOSE 102* 110* 115*  BUN 26* 22 15  CREATININE 1.88* 1.57* 1.41*  CALCIUM 8.7* 8.0* 7.7*  MG  --   --  1.8   GFR: Estimated Creatinine Clearance: 49.9 mL/min (A) (by C-G formula based on SCr of 1.41 mg/dL (H)). Liver Function Tests: Recent Labs  Lab 05/02/19 1723 05/03/19 0413 05/04/19 0407  AST 70* 38 22  ALT 79* 59* 37  ALKPHOS 66 55 44  BILITOT 0.6 0.6 0.6  PROT 6.9 6.1* 5.5*  ALBUMIN 3.7 3.4* 3.1*   Recent Labs  Lab 05/02/19 1723  LIPASE 51   No results for input(s): AMMONIA in the last 168 hours. Coagulation Profile: No results for input(s): INR, PROTIME in the last 168 hours. Cardiac Enzymes: No results for input(s): CKTOTAL, CKMB, CKMBINDEX, TROPONINI in the last 168 hours. BNP (last 3 results) No results for input(s): PROBNP in the last 8760 hours. HbA1C: No results for input(s): HGBA1C in the last 72 hours. CBG: Recent Labs  Lab 05/03/19 1158 05/03/19 1621 05/03/19 2305 05/04/19 0607 05/04/19 0827  GLUCAP 104* 97 95 101* 101*   Lipid Profile: No results for input(s): CHOL, HDL, LDLCALC, TRIG, CHOLHDL, LDLDIRECT in the last 72 hours. Thyroid Function Tests: No results for input(s): TSH, T4TOTAL, FREET4, T3FREE, THYROIDAB in the last 72 hours. Anemia Panel: No results for input(s): VITAMINB12, FOLATE, FERRITIN, TIBC, IRON, RETICCTPCT in the last 72 hours. Sepsis Labs: No results for input(s): PROCALCITON, LATICACIDVEN in the last 168 hours.  Recent Results (from the past 240 hour(s))  SARS CORONAVIRUS 2 (TAT 6-24 HRS) Nasopharyngeal Nasopharyngeal Swab     Status: None   Collection Time: 05/02/19 11:18 PM   Specimen: Nasopharyngeal Swab  Result Value Ref Range Status   SARS Coronavirus 2 NEGATIVE NEGATIVE Final    Comment: (NOTE) SARS-CoV-2  target nucleic acids are NOT DETECTED. The SARS-CoV-2 RNA is generally detectable in upper and lower respiratory specimens during the acute phase of infection. Negative results do not preclude SARS-CoV-2 infection, do not rule out co-infections with other pathogens, and should not be used as the sole basis for treatment or other patient management decisions. Negative results must be combined with clinical observations, patient history, and epidemiological information. The expected result is Negative. Fact Sheet for Patients: SugarRoll.be Fact Sheet for Healthcare Providers: https://www.woods-mathews.com/ This test is not yet approved or cleared by the [place] FDA and  has been authorized for detection and/or diagnosis of SARS-CoV-2 by FDA under an Emergency Use Authorization (EUA). This EUA will remain  in effect (meaning this test can be used) for the duration of the COVID-19 declaration under Section 56 4(b)(1) of the Act, 21 U.S.C. section 360bbb-3(b)(1), unless the authorization is terminated or revoked sooner. Performed at [facility], [place] [address], [name], [place] [address]          Radiology Studies: CT ABDOMEN PELVIS W CONTRAST  Result Date: 05/02/2019 CLINICAL DATA:  Abdominal pain, nausea and vomiting. Bowel obstruction suspected. EXAM: CT ABDOMEN AND PELVIS WITH CONTRAST TECHNIQUE: Multidetector CT imaging of the abdomen and pelvis was performed using the standard protocol following bolus administration of intravenous contrast. CONTRAST:  69mL OMNIPAQUE IOHEXOL 300 MG/ML  SOLN COMPARISON:  CT 05/10/2018, additional priors. FINDINGS: Lower chest: Linear scarring in the right lower lobe. No acute airspace disease or pleural fluid. Hepatobiliary: Stable tiny hepatic hypodensity in the left lobe, too small to accurately characterize. No new hepatic lesion. Postcholecystectomy with stable biliary dilatation, likely sequela  of prior cholecystectomy. Pancreas: No peripancreatic inflammation. Prominent pancreatic duct appears similar to prior, 3 mm proximally. Spleen: No acute findings. No focal abnormality. Upper normal in size. Adrenals/Urinary Tract: Normal adrenal glands. No hydronephrosis or perinephric edema. Homogeneous renal enhancement with symmetric excretion on delayed phase imaging. 5.8 cm cyst in the mid right kidney again seen. Punctate nonobstructing stones in both kidneys versus vascular calcifications. Small low-density lesions in the upper right kidney are too small to characterize. Urinary bladder is physiologically distended without wall thickening. Stomach/Bowel: Bowel evaluation is limited in the absence of enteric contrast. Small hiatal hernia. Partial gastrectomy with hepatic jejunostomy. Post subtotal colectomy with anastomosis in the sigmoid colon. Small bowel loops appear fluid-filled and dilated, however similar to prior exam. No discrete transition point to suggest obstruction. Vascular/Lymphatic: Aortic atherosclerosis. No aortic aneurysm. Portal vein is patent. Few central mesenteric nodes are prominent but not enlarged by size criteria. There is no pelvic adenopathy. Reproductive: Prominent prostate gland spans 5.4 cm. Other: No free air free fluid. No mesenteric edema. No evidence of intra-abdominal abscess. Musculoskeletal: Transitional lumbosacral anatomy. There are no acute or suspicious osseous abnormalities. IMPRESSION: 1. Dilated fluid-filled small bowel in the right abdomen that appears chronic and similar to prior exams, suggesting small bowel dysmotility. No discrete transition point to suggest obstruction. Post subtotal colectomy and partial gastrectomy with gastrojejunostomy. 2. Additional stable chronic findings as described. Aortic Atherosclerosis (ICD10-I70.0). Electronically Signed   By: Keith Rake M.D.   On: 05/02/2019 20:59   DG Chest Portable 1 View  Result Date:  05/02/2019 CLINICAL DATA:  NG tube placement EXAM: PORTABLE CHEST 1 VIEW COMPARISON:  04/01/2018 FINDINGS: Left Port-A-Cath remains in place, unchanged. NG tube coils in the cervical region, partially imaged on the superior aspect of the image. Heart is normal size. No confluent airspace opacities or effusions. No acute bony abnormality. IMPRESSION: NG tube coils in the cervical region. This should be removed completely and replaced. No acute cardiopulmonary disease. Electronically Signed   By: Rolm Baptise M.D.   On: 05/02/2019 22:53   DG Chest Port 1V same Day  Result Date: 05/02/2019 CLINICAL DATA:  Nasogastric repositioning. EXAM: PORTABLE CHEST 1 VIEW COMPARISON:  05/02/2019 FINDINGS: Tube remains coiled in the hypopharynx or upper cervical esophagus, not extending below the thoracic inlet. Lungs remain clear. No other change. IMPRESSION: Tube remains coiled in the neck. Electronically Signed   By: [name] Chimes M.D.   On: 05/02/2019 22:59        Scheduled Meds: . Chlorhexidine Gluconate Cloth  6 each Topical Daily  . enoxaparin (LOVENOX) injection  40 mg Subcutaneous Q24H  . pantoprazole (PROTONIX) IV  40 mg Intravenous Q24H  . sodium chloride flush  3 mL Intravenous Once   Continuous Infusions:   LOS: 1 day   Time spent= 35 mins    Yashika Mask Arsenio Loader, MD [facility]  If 7PM-7AM, please contact night-coverage  05/04/2019, 11:50 AM

## 2019-05-04 NOTE — Care Management Important Message (Signed)
Important Message  Patient Details  Name: [name] MRN: [id number] Date of Birth: 1951-02-20   Medicare Important Message Given:  Yes     [name] 05/04/2019, 4:20 PM

## 2019-05-05 DIAGNOSIS — Z85038 Personal history of other malignant neoplasm of large intestine: Secondary | ICD-10-CM

## 2019-05-05 DIAGNOSIS — K567 Ileus, unspecified: Principal | ICD-10-CM

## 2019-05-05 DIAGNOSIS — R1012 Left upper quadrant pain: Secondary | ICD-10-CM

## 2019-05-05 LAB — CBC
HCT: 42 % (ref 39.0–52.0)
Hemoglobin: 13.8 g/dL (ref 13.0–17.0)
MCH: 30.9 pg (ref 26.0–34.0)
MCHC: 32.9 g/dL (ref 30.0–36.0)
MCV: 94 fL (ref 80.0–100.0)
Platelets: 135 10*3/uL — ABNORMAL LOW (ref 150–400)
RBC: 4.47 MIL/uL (ref 4.22–5.81)
RDW: 13.5 % (ref 11.5–15.5)
WBC: 7 10*3/uL (ref 4.0–10.5)
nRBC: 0 % (ref 0.0–0.2)

## 2019-05-05 LAB — GLUCOSE, CAPILLARY
Glucose-Capillary: 100 mg/dL — ABNORMAL HIGH (ref 70–99)
Glucose-Capillary: 111 mg/dL — ABNORMAL HIGH (ref 70–99)
Glucose-Capillary: 90 mg/dL (ref 70–99)
Glucose-Capillary: 92 mg/dL (ref 70–99)

## 2019-05-05 LAB — COMPREHENSIVE METABOLIC PANEL
ALT: 26 U/L (ref 0–44)
AST: 16 U/L (ref 15–41)
Albumin: 3.2 g/dL — ABNORMAL LOW (ref 3.5–5.0)
Alkaline Phosphatase: 49 U/L (ref 38–126)
Anion gap: 8 (ref 5–15)
BUN: 10 mg/dL (ref 8–23)
CO2: 25 mmol/L (ref 22–32)
Calcium: 8.2 mg/dL — ABNORMAL LOW (ref 8.9–10.3)
Chloride: 107 mmol/L (ref 98–111)
Creatinine, Ser: 1.28 mg/dL — ABNORMAL HIGH (ref 0.61–1.24)
GFR calc Af Amer: >60 mL/min (ref >60)
GFR calc non Af Amer: 57 mL/min — ABNORMAL LOW (ref >60)
Glucose, Bld: 98 mg/dL (ref 70–99)
Potassium: 3.8 mmol/L (ref 3.5–5.1)
Sodium: 140 mmol/L (ref 135–145)
Total Bilirubin: 0.9 mg/dL (ref 0.3–1.2)
Total Protein: 5.8 g/dL — ABNORMAL LOW (ref 6.5–8.1)

## 2019-05-05 LAB — MAGNESIUM: Magnesium: 1.9 mg/dL (ref 1.7–2.4)

## 2019-05-05 NOTE — Progress Notes (Signed)
PROGRESS NOTE    [name]  WYO:[id number] DOB: 04-18-1950 DOA: 05/02/2019 PCP: [name], MD   Brief Narrative:  69 year old with history of HTN, HLD, pancreatic insufficiency, anxiety, GERD, B12 deficiency, hemigastrectomy, numerous small bowel resections with abdominal ideation, subtotal colectomy presents to the hospital with nausea and vomiting.  Admitted to the hospital with concerns of possible ileus.  Slowly advancing his diet.   Assessment & Plan:   Principal Problem:   Abdominal pain Active Problems:   Nausea and vomiting   Esophageal reflux disease   LUQ pain   HTN (hypertension)   Acute renal failure (HCC)   History of colon cancer, stage III   Ileus (HCC)   Pancreatic insufficiency   Dyslipidemia   B12 deficiency   Anxiety  Abdominal discomfort with nausea, improved History of multiple abdominal surgeries -CT of the abdomen pelvis no obvious small bowel obstruction,  -Clinically suspect ileus, tolerating clear liquid diet, okay to advance to full liquid diet, -Patient continues with nausea at this time, no emesis, scant amount of flatus, no BM  GERD -PPI  History of colon cancer -Status post resection and chemotherapy.  Pancreatic insufficiency -We can resume Creon once he is able to fully tolerate oral diet  B12 deficiency and anxiety -Resume home meds when he can consistently take p.o. medications.    DVT prophylaxis: Lovenox Code Status: Full code Family Communication: None Disposition Plan:   Patient From= home  Patient Anticipated D/C place= Home Home  Barriers= patient is still feeling nauseous.  Has had multiple abdominal surgeries in the past, plans to very slowly advance his diet today.  If he is able to tolerate enough and no longer nauseous by tomorrow he can be discharged. -Possible discharge on 05/06/2019 if tolerating diet better    Subjective: -Nausea is persistent, no emesis, no BM, 0 no fevers or  chills   Examination:  General exam: Appears calm and comfortable  Respiratory system: Clear to auscultation. Respiratory effort normal. Cardiovascular system: S1 & S2 heard, RRR. No JVD, murmurs, rubs, gallops or clicks. No pedal edema. Gastrointestinal system: Abdomen  less distended, epigastric discomfort without rebound, diminished bowel sounds Central nervous system: Alert and oriented. No focal neurological deficits. Extremities: Symmetric 5 x 5 power. Skin: Multiple surgical scars on the abdomen noted. Psychiatry: Judgement and insight appear normal. Mood & affect appropriate.     Objective: Vitals:   05/04/19 1618 05/04/19 2109 05/05/19 0608 05/05/19 1357  BP: 133/74 (!) 117/97 129/74 127/79  Pulse: 65 72 (!) 59 72  Resp: 20  18 19   Temp: 98.8 F (37.1 C) 97.9 F (36.6 C) 97.7 F (36.5 C) 98.3 F (36.8 C)  TempSrc: Oral Oral Oral Oral  SpO2: 95% 99% 95% 96%  Weight:      Height:        Intake/Output Summary (Last 24 hours) at 05/05/2019 1828 Last data filed at 05/05/2019 1300 Gross per 24 hour  Intake 480 ml  Output --  Net 480 ml   Filed Weights   05/02/19 1705  Weight: 70.3 kg     Data Reviewed:   CBC: Recent Labs  Lab 05/02/19 1723 05/04/19 0407 05/05/19 0700  WBC 9.9 7.6 7.0  HGB 15.9 13.1 13.8  HCT 49.6 40.6 42.0  MCV 95.6 94.6 94.0  PLT 154 121* 774*   Basic Metabolic Panel: Recent Labs  Lab 05/02/19 1723 05/03/19 0413 05/04/19 0407 05/05/19 0700  NA 140 139 138 140  K 4.6 3.9 3.7 3.8  CL 106 110 107 107  CO2 25 23 23 25   GLUCOSE 102* 110* 115* 98  BUN 26* 22 15 10   CREATININE 1.88* 1.57* 1.41* 1.28*  CALCIUM 8.7* 8.0* 7.7* 8.2*  MG  --   --  1.8 1.9   GFR: Estimated Creatinine Clearance: 54.9 mL/min (A) (by C-G formula based on SCr of 1.28 mg/dL (H)). Liver Function Tests: Recent Labs  Lab 05/02/19 1723 05/03/19 0413 05/04/19 0407 05/05/19 0700  AST 70* 38 22 16  ALT 79* 59* 37 26  ALKPHOS 66 55 44 49  BILITOT 0.6  0.6 0.6 0.9  PROT 6.9 6.1* 5.5* 5.8*  ALBUMIN 3.7 3.4* 3.1* 3.2*   Recent Labs  Lab 05/02/19 1723  LIPASE 51   No results for input(s): AMMONIA in the last 168 hours. Coagulation Profile: No results for input(s): INR, PROTIME in the last 168 hours. Cardiac Enzymes: No results for input(s): CKTOTAL, CKMB, CKMBINDEX, TROPONINI in the last 168 hours. BNP (last 3 results) No results for input(s): PROBNP in the last 8760 hours. HbA1C: No results for input(s): HGBA1C in the last 72 hours. CBG: Recent Labs  Lab 05/04/19 1639 05/05/19 0010 05/05/19 0609 05/05/19 1107 05/05/19 1620  GLUCAP 89 92 100* 111* 90   Lipid Profile: No results for input(s): CHOL, HDL, LDLCALC, TRIG, CHOLHDL, LDLDIRECT in the last 72 hours. Thyroid Function Tests: No results for input(s): TSH, T4TOTAL, FREET4, T3FREE, THYROIDAB in the last 72 hours. Anemia Panel: No results for input(s): VITAMINB12, FOLATE, FERRITIN, TIBC, IRON, RETICCTPCT in the last 72 hours. Sepsis Labs: No results for input(s): PROCALCITON, LATICACIDVEN in the last 168 hours.  Recent Results (from the past 240 hour(s))  SARS CORONAVIRUS 2 (TAT 6-24 HRS) Nasopharyngeal Nasopharyngeal Swab     Status: None   Collection Time: 05/02/19 11:18 PM   Specimen: Nasopharyngeal Swab  Result Value Ref Range Status   SARS Coronavirus 2 NEGATIVE NEGATIVE Final    Comment: (NOTE) SARS-CoV-2 target nucleic acids are NOT DETECTED. The SARS-CoV-2 RNA is generally detectable in upper and lower respiratory specimens during the acute phase of infection. Negative results do not preclude SARS-CoV-2 infection, do not rule out co-infections with other pathogens, and should not be used as the sole basis for treatment or other patient management decisions. Negative results must be combined with clinical observations, patient history, and epidemiological information. The expected result is Negative. Fact Sheet for  Patients: SugarRoll.be Fact Sheet for Healthcare Providers: https://www.woods-mathews.com/ This test is not yet approved or cleared by the [place] FDA and  has been authorized for detection and/or diagnosis of SARS-CoV-2 by FDA under an Emergency Use Authorization (EUA). This EUA will remain  in effect (meaning this test can be used) for the duration of the COVID-19 declaration under Section 56 4(b)(1) of the Act, 21 U.S.C. section 360bbb-3(b)(1), unless the authorization is terminated or revoked sooner. Performed at [facility], [place] [address] [place], [place] [address]          Radiology Studies: No results found.      Scheduled Meds: . Chlorhexidine Gluconate Cloth  6 each Topical Daily  . enoxaparin (LOVENOX) injection  40 mg Subcutaneous Q24H  . pantoprazole (PROTONIX) IV  40 mg Intravenous Q24H  . sodium chloride flush  3 mL Intravenous Once   Continuous Infusions:   LOS: 2 days    Roxan Hockey, MD [facility]  If 7PM-7AM, please contact night-coverage  05/05/2019, 6:28 PM

## 2019-05-06 DIAGNOSIS — N170 Acute kidney failure with tubular necrosis: Secondary | ICD-10-CM

## 2019-05-06 DIAGNOSIS — K8689 Other specified diseases of pancreas: Secondary | ICD-10-CM

## 2019-05-06 DIAGNOSIS — I10 Essential (primary) hypertension: Secondary | ICD-10-CM

## 2019-05-06 LAB — COMPREHENSIVE METABOLIC PANEL
ALT: 21 U/L (ref 0–44)
AST: 14 U/L — ABNORMAL LOW (ref 15–41)
Albumin: 3.4 g/dL — ABNORMAL LOW (ref 3.5–5.0)
Alkaline Phosphatase: 51 U/L (ref 38–126)
Anion gap: 9 (ref 5–15)
BUN: 9 mg/dL (ref 8–23)
CO2: 24 mmol/L (ref 22–32)
Calcium: 8.6 mg/dL — ABNORMAL LOW (ref 8.9–10.3)
Chloride: 105 mmol/L (ref 98–111)
Creatinine, Ser: 1.21 mg/dL (ref 0.61–1.24)
GFR calc Af Amer: >60 mL/min (ref >60)
GFR calc non Af Amer: >60 mL/min (ref >60)
Glucose, Bld: 94 mg/dL (ref 70–99)
Potassium: 3.7 mmol/L (ref 3.5–5.1)
Sodium: 138 mmol/L (ref 135–145)
Total Bilirubin: 0.8 mg/dL (ref 0.3–1.2)
Total Protein: 5.9 g/dL — ABNORMAL LOW (ref 6.5–8.1)

## 2019-05-06 LAB — MAGNESIUM: Magnesium: 1.9 mg/dL (ref 1.7–2.4)

## 2019-05-06 LAB — GLUCOSE, CAPILLARY
Glucose-Capillary: 106 mg/dL — ABNORMAL HIGH (ref 70–99)
Glucose-Capillary: 93 mg/dL (ref 70–99)
Glucose-Capillary: 97 mg/dL (ref 70–99)

## 2019-05-06 LAB — CBC
HCT: 44.2 % (ref 39.0–52.0)
Hemoglobin: 14.4 g/dL (ref 13.0–17.0)
MCH: 30.2 pg (ref 26.0–34.0)
MCHC: 32.6 g/dL (ref 30.0–36.0)
MCV: 92.7 fL (ref 80.0–100.0)
Platelets: 139 10*3/uL — ABNORMAL LOW (ref 150–400)
RBC: 4.77 MIL/uL (ref 4.22–5.81)
RDW: 13.3 % (ref 11.5–15.5)
WBC: 7.2 10*3/uL (ref 4.0–10.5)
nRBC: 0 % (ref 0.0–0.2)

## 2019-05-06 MED ORDER — SENNOSIDES-DOCUSATE SODIUM 8.6-50 MG PO TABS: 2.0000 | ORAL_TABLET | Freq: Every day | ORAL | 1 refills | Status: DC

## 2019-05-06 MED ORDER — ENOXAPARIN SODIUM 60 MG/0.6ML ~~LOC~~ SOLN: 60.0000 mg | SUBCUTANEOUS | 0 refills | Status: DC

## 2019-05-06 MED ORDER — LORAZEPAM 1 MG PO TABS: 1.0000 mg | ORAL_TABLET | Freq: Two times a day (BID) | ORAL | 0 refills | Status: DC | PRN

## 2019-05-06 MED ORDER — CARVEDILOL 25 MG PO TABS: 12.5000 mg | ORAL_TABLET | Freq: Two times a day (BID) | ORAL | 2 refills | Status: DC

## 2019-05-06 MED ORDER — MIRTAZAPINE 15 MG PO TABS: 15.0000 mg | ORAL_TABLET | Freq: Every day | ORAL | 2 refills | Status: DC

## 2019-05-06 MED ORDER — HEPARIN SOD (PORK) LOCK FLUSH 100 UNIT/ML IV SOLN
500.0000 [IU] | INTRAVENOUS | Status: AC | PRN
  Administered 2019-05-06: 500 [IU]
  Filled 2019-05-06: qty 5

## 2019-05-06 MED ORDER — POLYETHYLENE GLYCOL 3350 17 G PO PACK: 17.0000 g | PACK | Freq: Every day | ORAL | 3 refills | Status: DC

## 2019-05-06 NOTE — Discharge Instructions (Signed)
1)Avoid ibuprofen/Advil/Aleve/Motrin/Goody Powders/Naproxen/BC powders/Meloxicam/Diclofenac/Indomethacin and other Nonsteroidal anti-inflammatory medications as these will make you more likely to bleed and can cause stomach ulcers, can also cause Kidney problems.   2)Soft Diet advised--- avoid too much bread and processed foods  3)Call or return if nausea vomiting or difficulty with bowel movements or abdominal pain or abdominal distention  4)Follow-up with PCP for recheck CBC and BMP test in 1 to 2 weeks

## 2019-05-06 NOTE — Discharge Summary (Signed)
[name], is a 69 y.o. male  DOB 04/25/1950  MRN [id number].  Admission date:  05/02/2019  Admitting Physician  Damita Lack, MD  Discharge Date:  05/06/2019   Primary MD  [name], MD  Recommendations for primary care physician for things to follow:   1)Avoid ibuprofen/Advil/Aleve/Motrin/Goody Powders/Naproxen/BC powders/Meloxicam/Diclofenac/Indomethacin and other Nonsteroidal anti-inflammatory medications as these will make you more likely to bleed and can cause stomach ulcers, can also cause Kidney problems.   2)Soft Diet advised--- avoid too much bread and processed foods  3)Call or return if nausea vomiting or difficulty with bowel movements or abdominal pain or abdominal distention  4)Follow-up with PCP for recheck CBC and BMP test in 1 to 2 weeks  Admission Diagnosis  Small bowel obstruction ([place]) [K56.609] Encounter for imaging study to confirm nasogastric (NG) tube placement [Z01.89] Abdominal pain [R10.9]   Discharge Diagnosis  Small bowel obstruction ([name]) [K56.609] Encounter for imaging study to confirm nasogastric (NG) tube placement [Z01.89] Abdominal pain [R10.9]    Principal Problem:   Abdominal pain Active Problems:   Nausea and vomiting   Esophageal reflux disease   LUQ pain   HTN (hypertension)   Acute renal failure (HCC)   History of colon cancer, stage III   Ileus ([name])   Pancreatic insufficiency   Dyslipidemia   B12 deficiency   Anxiety     Past Medical History:  Diagnosis Date  . 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 Anemia   . Anxiety   . Arthritis   . Barrett's esophagus   . Blood transfusion   . Bowel obstruction ([name]) 05/13/2012   Recurrent  . Bronchitis   . Chest pain at rest   . Chronic abdominal pain   . Erosive esophagitis   . ETOH abuse    quit 03/2010  . GERD (gastroesophageal reflux disease)     . Hx of Clostridium difficile infection 01/2012  . Hypertension   . Ileus ([name])   . Iron deficiency anemia 03/23/2016  . Obstruction of bowel ([name]) 03/03/14  . Osteoporosis   . Personal history of PE (pulmonary embolism) 10/01/2010  . Pneumonia   . Pulmonary embolism ([name]) 02/2010  . Recurrent upper respiratory infection (URI)   . Renal disorder   . S/P endoscopy September 28, 2010   erosive reflux esophagitis, Billroth I anatomy  . 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 Seizures ([name])   . Shortness of breath   . TIA (transient ischemic attack) 10/11  . Vitamin B12 deficiency     Past Surgical History:  Procedure Laterality Date  . ABDOMINAL ADHESION SURGERY  03/04/15   @ [facility]  . ABDOMINAL EXPLORATION SURGERY    . abdominal sugery     for bowel obstruction x 8, all in 1980s, except for one in 07/2010  . APPENDECTOMY  1980s  . Billroth 1 hemigastrectomy  1980s   per patient for benign duodenal tumor  . CARDIAC CATHETERIZATION  07/17/2012  . CHOLECYSTECTOMY  1980s  .  COLON SURGERY  May 2012   left hemicolectomy, colon cancer found at time of surgery for bowel obstruction  . COLON SURGERY  10/12/2018   At [facility]: Resection of right colon and terminal ileum with creation of ileorectal anastomosis for large bowel obstruction.  Cecum and ascending colon noted to be attached to the rectum.  . COLONOSCOPY  03/18/2011   anastomosis at 35cm. Several adenomatous polyps removed. Sigmoid diverticulosis. Next TCS 02/2013  . COLONOSCOPY N/A 07/24/2012   LZJ:QBHALP post segmental resection with normal-appearing colonic anastomosis aside from an adjacent polyp-removed as described above. Rectal polyp-removed as described above. CT findings appear to have been artifactual. tubular adenomas/prolapsed type polyp.  . COLONOSCOPY N/A 05/15/2015   Procedure: COLONOSCOPY;  Surgeon: [name], MD;  [facility];  Service: Endoscopy;  Laterality: N/A;  . COLONOSCOPY  09/26/2018   at [facility]: prep not  adequate for colon cancer surveillance.  Prior end-to-end colonic anastomosis in the rectosigmoid region.  This was patent and characterized by healthy-appearing mucosa.  Anastomosis was traversed.  Normal terminal ileum.  . COLONOSCOPY WITH PROPOFOL N/A 11/04/2016   Dr. [name], status post subtotal colectomy with normal-appearing residual lower GI tract.  Next colonoscopy in 5 years.  . ESOPHAGOGASTRODUODENOSCOPY  09/28/2010  . ESOPHAGOGASTRODUODENOSCOPY  12/01/2010   Cervical web status post dilation, erosive esophagitis, B1 hemigastrectomy, inflamed anastomosis  . ESOPHAGOGASTRODUODENOSCOPY  04/16/2011   excoriation at [facility] c/w trauma/M-W tear, friable gastric anastomosis, dilation efferent limb  . ESOPHAGOGASTRODUODENOSCOPY N/A 06/03/2014   Dr.[name]- cervcal esopphageal web s/p dilation. abnormal distal esophagus bx= barretts esophagus  . ESOPHAGOGASTRODUODENOSCOPY N/A 05/15/2015   Procedure: ESOPHAGOGASTRODUODENOSCOPY (EGD);  Surgeon: [name], MD;  [facility];  Service: Endoscopy;  Laterality: N/A;  230  . ESOPHAGOGASTRODUODENOSCOPY (EGD) WITH ESOPHAGEAL DILATION  02/25/2012   FXT:[id number] esophageal web-s/p dilation anddisruption as described above. Status post prior gastric with Billroth I configuration. Abnormal gastric mucosa at the anastomosis. Gastric biopsy showed mild chronic inflammation but no H. pylori   . ESOPHAGOGASTRODUODENOSCOPY (EGD) WITH PROPOFOL N/A 11/04/2016   Dr. [name]: Reflux esophagitis, small hiatal hernia status post hemigastrectomy.  Single patent efferent small bowel limb appeared normal, 2 x 2 centimeter tongue of salmon epithelium again seen, esophageal dilation.  Biopsies consistent with reflux changes, not Barrett's however this was confirmed on prior EGDs.  Offer 3-year follow-up EGD August 2021.  [name] HERNIA REPAIR     right inguinal  . MALONEY DILATION N/A 06/03/2014   Procedure: [name] DILATION;  Surgeon: [name], MD;  [facility];   Service: Endoscopy;  Laterality: N/A;  [name] DILATION N/A 05/15/2015   Procedure: [name] DILATION;  Surgeon: [name], MD;  [facility];  Service: Endoscopy;  Laterality: N/A;  . [name] DILATION N/A 11/04/2016   Procedure: [name] DILATION;  Surgeon: [name], MD;  [facility];  Service: Endoscopy;  Laterality: N/A;  . PORTACATH PLACEMENT    . SAVORY DILATION N/A 06/03/2014   Procedure: SAVORY DILATION;  Surgeon: [name], MD;  [facility];  Service: Endoscopy;  Laterality: N/A;       HPI  from the history and physical done on the day of admission:   Chief Complaint: Abdominal pain  HPI: JERRY CLYNE is a 69 y.o. male with medical history significant for hypertension, hyperlipidemia, pancreatic insufficiency, anxiety, GERD, B12 deficiency, hemigastrectomy (in the [employer] for benign tumor), numerous small bowel resections or abdominal adhesions  and bowel obstruction and subtotal colectomy for colon cancer, postprandial abdominal distention, nausea and early satiety who presents to the emergency department due to abdominal pain that started on Tuesday morning (2/9).  Pain was in left upper quadrant, it was rated as 7/10 on pain scale and was associated with nausea and vomiting. He denies fever, chills, chest pain, shortness of breath, headache, numbness or tingling.  ED Course:  In the emergency department, patient was initially hypothermic with a temperature of  96.13F, BP was 143/82, but other vital signs are within normal range.  Work-up in the ED showed normal CBC and BMP except for elevated BUN to creatinine 26/1.88 (baseline creatinine at 1.4-1.5), hypocalcemia and elevated transaminitis.  Urinalysis was negative.  Chest x-ray showed no acute cardiopulmonary disease.  CT abdomen and pelvis with contrast showed dilated fluid-filled small bowel in the right abdomen that appears chronic and similar to prior exams, suggesting small  bowel dysmotility with no discrete transition point to suggest obstruction.  He was treated with IV Dilaudid and Zofran.  NG tube was attempted twice in the ED without success. Hospitalist was asked to admit for further evaluation and management.      Hospital Course:    Brief Summary:- 69 year old with history of HTN, HLD, pancreatic insufficiency, anxiety, GERD, B12 deficiency,  hemigastrectomy, numerous small bowel resections with abdominal ideation, subtotal colectomy presents to the hospital with nausea and vomiting.  Admitted to the hospital with concerns of possible ileus.  Slowly advancing his diet.   A/p 1)Ileus Vs PSBO- History of multiple abdominal surgeries -CT of the abdomen pelvis no obvious small bowel obstruction,  -Clinically suspect ileus, tolerated diet well after advancing diet slowly -Patient is now having BMs, passing gas and no significant nausea and no emesis -Please note that Patient underwent resection of the right colon and terminal ileum with creation of ileal rectal anastomosis on October 12, 2018 for large bowel obstruction  2)GERD --- Continue Protonix  3)History of colon cancer -Patient is status post more than 16 abdominal surgeries including colon resection with ileal rectal anastomosis -Status post resection and chemotherapy. -Follows with Dr. Delton Coombes also follows with [facility]  Pancreatic insufficiency -Continue Creon  B12 deficiency and anxiety -Resume home meds  Anxiety/Chronic Pain--- resume home medications  HTN-stable, Coreg reduced to 12.5 mg twice daily, last known EF 55 to 60% based on echo from December 2017  Chronic anticoagulation--- review of records reveals the patient has been on Lovenox 60 mg subcu daily for chronic anticoagulation in the setting of prior VTE and underlying history of malignancy -Apparently did not do well with all the types of anticoagulation in the past -It seems Lovenox has worked well for patient and  factor X levels have been checked in the past -Anticoagulation reviewed with pharmacist, chart reviewed, pharmacy records reviewed at [facility] -Defer management of chronic anticoagulation with subcu Lovenox 60 mg daily to patient's PCP and his [facility] providers  AKI--- creatinine improved to 1.21 from 1.88 on admission,   Discharge Condition:  stable  Follow UP---PCp  Diet and Activity recommendation:  As advised  Discharge Instructions    Discharge Instructions    Call MD for:  difficulty breathing, headache or visual disturbances   Complete by: As directed    Call MD for:  difficulty breathing, headache or visual disturbances   Complete by: As directed    Call MD for:  extreme fatigue   Complete by: As directed    Call MD for:  persistant dizziness or light-headedness   Complete by: As directed    Call MD for:  persistant dizziness or light-headedness   Complete by: As directed    Call MD for:  persistant nausea and vomiting   Complete by: As directed    Call MD for:  persistant nausea and vomiting   Complete by: As directed    Call MD for:  severe uncontrolled pain   Complete by: As directed    Call MD for:  severe uncontrolled pain   Complete by: As directed    Call MD for:  temperature >100.4   Complete by: As directed    Call MD for:  temperature >100.4   Complete by: As directed    Diet - low sodium heart healthy   Complete by: As directed    Discharge instructions   Complete by: As directed    1)Avoid ibuprofen/Advil/Aleve/Motrin/Goody Powders/Naproxen/BC powders/Meloxicam/Diclofenac/Indomethacin and other Nonsteroidal anti-inflammatory medications as these will make you more likely to bleed and can cause stomach ulcers, can also cause Kidney problems.   2)Soft Diet advised--- avoid too much bread and processed foods  3)Call or return if nausea vomiting or difficulty with bowel movements or abdominal pain or abdominal distention  4)Follow-up with PCP for recheck  CBC and BMP test in 1 to 2 weeks   Increase activity slowly   Complete by: As directed    Increase activity slowly   Complete by: As directed        Discharge Medications     Allergies as of 05/06/2019   No Known Allergies     Medication List    TAKE these medications   acetaminophen 325 MG tablet Commonly known as: TYLENOL TAKE 3 TABLETS BY MOUTH EVERY 6 HOURS AS NEEDED FOR UP TO 10 DAYS   ammonium lactate 12 % cream Commonly known as: AMLACTIN APPLY CREAM TO DRY SKIN ON LEGS ARMS HANDS AND BACK AREA ONCE TO TWICE DAILY (AVOID FACE GROIN UNDERARMS)   atorvastatin 20 MG tablet Commonly known as: LIPITOR Take 20 mg by mouth at bedtime.   carvedilol 25 MG tablet Commonly known as: Coreg Take 0.5 tablets (12.5 mg total) by mouth 2 (two) times daily with a meal. What changed:   how much to take  how to take this  when to take this   enoxaparin 60 MG/0.6ML injection Commonly known as: Lovenox Inject 0.6 mLs (60 mg total) into the skin daily. What changed: how to take this   Ensure Plus HN Liqd Take 1 Bottle daily by mouth.   gabapentin 100 MG capsule Commonly known as: NEURONTIN Take 1 capsule (100 mg total) by mouth at bedtime.   lidocaine-prilocaine cream Commonly known as: EMLA Apply 1 application topically as needed (prior to accessing port).   lipase/protease/amylase 36000 UNITS Cpep capsule Commonly known as: Creon Take 2 capsules with meals and 1 capsule with snacks.   loperamide 2 MG capsule Commonly known as: IMODIUM Take 1-2 tablets by mouth up to 4X/day as needed before meals.  Titrate amount based on stool consistency   LORazepam 1 MG tablet Commonly known as: ATIVAN Take 1 tablet (1 mg total) by mouth every 12 (twelve) hours as needed for anxiety or sleep. What changed:   when to take this  reasons to take this   mirtazapine 15 MG tablet Commonly known as: Remeron Take 1 tablet (15 mg total) by mouth at bedtime. What changed:    medication strength  See the new instructions.  multivitamin with minerals Tabs tablet Take 1 tablet by mouth daily.   ondansetron 4 MG disintegrating tablet Commonly known as: Zofran ODT Take 1 tablet (4 mg total) by mouth every 8 (eight) hours as needed.   pantoprazole 40 MG tablet Commonly known as: PROTONIX Take 1 tablet (40 mg total) by mouth 2 (two) times daily before a meal.   polyethylene glycol 17 g packet Commonly known as: MIRALAX / GLYCOLAX Take 17 g by mouth daily.   PROBIOTIC PO Take 1 tablet by mouth daily.   prochlorperazine 10 MG tablet Commonly known as: COMPAZINE Take 10 mg by mouth every 6 (six) hours as needed for nausea or vomiting.   Reclast 5 MG/100ML Soln injection Generic drug: zoledronic acid Inject 5 mg into the vein See admin instructions. Pt gets a dose yearly. Due again 2018   senna-docusate 8.6-50 MG tablet Commonly known as: Senokot-S Take 2 tablets by mouth at bedtime.   sucralfate 1 GM/10ML suspension Commonly known as: CARAFATE Take 10 mLs (1 g total) by mouth 4 (four) times daily as needed. For breakthrough heartburn   traMADol 50 MG tablet Commonly known as: ULTRAM 50 mg daily as needed.   Vitamin D 50 MCG (2000 UT) Caps Take 2,000 Units by mouth daily.      Major procedures and Radiology Reports - PLEASE review detailed and final reports for all details, in brief -    CT ABDOMEN PELVIS W CONTRAST  Result Date: 05/02/2019 CLINICAL DATA:  Abdominal pain, nausea and vomiting. Bowel obstruction suspected. EXAM: CT ABDOMEN AND PELVIS WITH CONTRAST TECHNIQUE: Multidetector CT imaging of the abdomen and pelvis was performed using the standard protocol following bolus administration of intravenous contrast. CONTRAST:  38mL OMNIPAQUE IOHEXOL 300 MG/ML  SOLN COMPARISON:  CT 05/10/2018, additional priors. FINDINGS: Lower chest: Linear scarring in the right lower lobe. No acute airspace disease or pleural fluid. Hepatobiliary:  Stable tiny hepatic hypodensity in the left lobe, too small to accurately characterize. No new hepatic lesion. Postcholecystectomy with stable biliary dilatation, likely sequela of prior cholecystectomy. Pancreas: No peripancreatic inflammation. Prominent pancreatic duct appears similar to prior, 3 mm proximally. Spleen: No acute findings. No focal abnormality. Upper normal in size. Adrenals/Urinary Tract: Normal adrenal glands. No hydronephrosis or perinephric edema. Homogeneous renal enhancement with symmetric excretion on delayed phase imaging. 5.8 cm cyst in the mid right kidney again seen. Punctate nonobstructing stones in both kidneys versus vascular calcifications. Small low-density lesions in the upper right kidney are too small to characterize. Urinary bladder is physiologically distended without wall thickening. Stomach/Bowel: Bowel evaluation is limited in the absence of enteric contrast. Small hiatal hernia. Partial gastrectomy with hepatic jejunostomy. Post subtotal colectomy with anastomosis in the sigmoid colon. Small bowel loops appear fluid-filled and dilated, however similar to prior exam. No discrete transition point to suggest obstruction. Vascular/Lymphatic: Aortic atherosclerosis. No aortic aneurysm. Portal vein is patent. Few central mesenteric nodes are prominent but not enlarged by size criteria. There is no pelvic adenopathy. Reproductive: Prominent prostate gland spans 5.4 cm. Other: No free air free fluid. No mesenteric edema. No evidence of intra-abdominal abscess. Musculoskeletal: Transitional lumbosacral anatomy. There are no acute or suspicious osseous abnormalities. IMPRESSION: 1. Dilated fluid-filled small bowel in the right abdomen that appears chronic and similar to prior exams, suggesting small bowel dysmotility. No discrete transition point to suggest obstruction. Post subtotal colectomy and partial gastrectomy with gastrojejunostomy. 2. Additional stable chronic findings as  described. Aortic Atherosclerosis (ICD10-I70.0). Electronically Signed   By: Threasa Beards  Sanford M.D.   On: 05/02/2019 20:59   DG Chest Portable 1 View  Result Date: 05/02/2019 CLINICAL DATA:  NG tube placement EXAM: PORTABLE CHEST 1 VIEW COMPARISON:  04/01/2018 FINDINGS: Left Port-A-Cath remains in place, unchanged. NG tube coils in the cervical region, partially imaged on the superior aspect of the image. Heart is normal size. No confluent airspace opacities or effusions. No acute bony abnormality. IMPRESSION: NG tube coils in the cervical region. This should be removed completely and replaced. No acute cardiopulmonary disease. Electronically Signed   By: Rolm Baptise M.D.   On: 05/02/2019 22:53   DG Chest Port 1V same Day  Result Date: 05/02/2019 CLINICAL DATA:  Nasogastric repositioning. EXAM: PORTABLE CHEST 1 VIEW COMPARISON:  05/02/2019 FINDINGS: Tube remains coiled in the hypopharynx or upper cervical esophagus, not extending below the thoracic inlet. Lungs remain clear. No other change. IMPRESSION: Tube remains coiled in the neck. Electronically Signed   By: [name] Chimes M.D.   On: 05/02/2019 22:59    Micro Results    Recent Results (from the past 240 hour(s))  SARS CORONAVIRUS 2 (TAT 6-24 HRS) Nasopharyngeal Nasopharyngeal Swab     Status: None   Collection Time: 05/02/19 11:18 PM   Specimen: Nasopharyngeal Swab  Result Value Ref Range Status   SARS Coronavirus 2 NEGATIVE NEGATIVE Final    Comment: (NOTE) SARS-CoV-2 target nucleic acids are NOT DETECTED. The SARS-CoV-2 RNA is generally detectable in upper and lower respiratory specimens during the acute phase of infection. Negative results do not preclude SARS-CoV-2 infection, do not rule out co-infections with other pathogens, and should not be used as the sole basis for treatment or other patient management decisions. Negative results must be combined with clinical observations, patient history, and epidemiological information.  The expected result is Negative. Fact Sheet for Patients: SugarRoll.be Fact Sheet for Healthcare Providers: https://www.woods-mathews.com/ This test is not yet approved or cleared by the [place] FDA and  has been authorized for detection and/or diagnosis of SARS-CoV-2 by FDA under an Emergency Use Authorization (EUA). This EUA will remain  in effect (meaning this test can be used) for the duration of the COVID-19 declaration under Section 56 4(b)(1) of the Act, 21 U.S.C. section 360bbb-3(b)(1), unless the authorization is terminated or revoked sooner. Performed at [facility], [place] [address] [place], [place] [address]     Today   Subjective    Danial Hlavac today has no new complaints,  No Nausea, Vomiting or Diarrhea  --Tolerating soft food and passing gas and having bowel movements            Patient has been seen and examined prior to discharge   Objective   Blood pressure 136/78, pulse 66, temperature 98.2 F (36.8 C), temperature source Oral, resp. rate 18, height 5\' 9"  (1.753 m), weight 70.3 kg, SpO2 97 %.   Intake/Output Summary (Last 24 hours) at 05/06/2019 1250 Last data filed at 05/05/2019 1800 Gross per 24 hour  Intake 480 ml  Output --  Net 480 ml   Exam Gen:- Awake Alert, no acute distress  HEENT:- [place].AT, No sclera icterus Neck-Supple Neck,No JVD,.  Lungs-  CTAB , good air movement bilaterally  CV- S1, S2 normal, regular, Port-A-Cath site clean dry intact Abd-  +ve B.Sounds, Abd Soft, No tenderness, healed prior laparotomy scars, no significant distention Extremity/Skin:- No  edema,   good pulses Psych-affect is appropriate, oriented x3 Neuro-no new focal deficits, no tremors    Data Review  CBC w Diff:  Lab Results  Component Value Date   WBC 7.2 05/06/2019   HGB 14.4 05/06/2019   HCT 44.2 05/06/2019   PLT 139 (L) 05/06/2019   LYMPHOPCT 29 11/16/2018   BANDSPCT 0 10/07/2012    MONOPCT 8 11/16/2018   EOSPCT 3 11/16/2018   BASOPCT 0 11/16/2018    CMP:  Lab Results  Component Value Date   NA 138 05/06/2019   K 3.7 05/06/2019   CL 105 05/06/2019   CO2 24 05/06/2019   BUN 9 05/06/2019   CREATININE 1.21 05/06/2019   CREATININE 1.60 (H) 06/11/2016   PROT 5.9 (L) 05/06/2019   ALBUMIN 3.4 (L) 05/06/2019   BILITOT 0.8 05/06/2019   ALKPHOS 51 05/06/2019   AST 14 (L) 05/06/2019   ALT 21 05/06/2019  .   Total Discharge time is about 33 minutes  Roxan Hockey M.D on 05/06/2019 at 12:50 PM  Go to [email or web address] -  for contact info  [facility] - Office  [phone]

## 2019-05-06 NOTE — Plan of Care (Signed)

## 2019-05-06 NOTE — Progress Notes (Signed)
Nsg Discharge Note  Admit Date:  05/02/2019 Discharge date: 05/06/2019   [name] to be D/C'd  Home per MD order.  AVS completed.  Copy for chart, and copy for patient signed, and dated. Patient/caregiver able to verbalize understanding.  Discharge Medication: Allergies as of 05/06/2019   No Known Allergies     Medication List    TAKE these medications   acetaminophen 325 MG tablet Commonly known as: TYLENOL TAKE 3 TABLETS BY MOUTH EVERY 6 HOURS AS NEEDED FOR UP TO 10 DAYS   ammonium lactate 12 % cream Commonly known as: AMLACTIN APPLY CREAM TO DRY SKIN ON LEGS ARMS HANDS AND BACK AREA ONCE TO TWICE DAILY (AVOID FACE GROIN UNDERARMS)   atorvastatin 20 MG tablet Commonly known as: LIPITOR Take 20 mg by mouth at bedtime.   carvedilol 25 MG tablet Commonly known as: Coreg Take 0.5 tablets (12.5 mg total) by mouth 2 (two) times daily with a meal. What changed:   how much to take  how to take this  when to take this   enoxaparin 60 MG/0.6ML injection Commonly known as: Lovenox Inject 0.6 mLs (60 mg total) into the skin daily. What changed: how to take this   Ensure Plus HN Liqd Take 1 Bottle daily by mouth.   gabapentin 100 MG capsule Commonly known as: NEURONTIN Take 1 capsule (100 mg total) by mouth at bedtime.   lidocaine-prilocaine cream Commonly known as: EMLA Apply 1 application topically as needed (prior to accessing port).   lipase/protease/amylase 36000 UNITS Cpep capsule Commonly known as: Creon Take 2 capsules with meals and 1 capsule with snacks.   loperamide 2 MG capsule Commonly known as: IMODIUM Take 1-2 tablets by mouth up to 4X/day as needed before meals.  Titrate amount based on stool consistency   LORazepam 1 MG tablet Commonly known as: ATIVAN Take 1 tablet (1 mg total) by mouth every 12 (twelve) hours as needed for anxiety or sleep. What changed:   when to take this  reasons to take this   mirtazapine 15 MG tablet Commonly  known as: Remeron Take 1 tablet (15 mg total) by mouth at bedtime. What changed:   medication strength  See the new instructions.   multivitamin with minerals Tabs tablet Take 1 tablet by mouth daily.   ondansetron 4 MG disintegrating tablet Commonly known as: Zofran ODT Take 1 tablet (4 mg total) by mouth every 8 (eight) hours as needed.   pantoprazole 40 MG tablet Commonly known as: PROTONIX Take 1 tablet (40 mg total) by mouth 2 (two) times daily before a meal.   polyethylene glycol 17 g packet Commonly known as: MIRALAX / GLYCOLAX Take 17 g by mouth daily.   PROBIOTIC PO Take 1 tablet by mouth daily.   prochlorperazine 10 MG tablet Commonly known as: COMPAZINE Take 10 mg by mouth every 6 (six) hours as needed for nausea or vomiting.   Reclast 5 MG/100ML Soln injection Generic drug: zoledronic acid Inject 5 mg into the vein See admin instructions. Pt gets a dose yearly. Due again 2018   senna-docusate 8.6-50 MG tablet Commonly known as: Senokot-S Take 2 tablets by mouth at bedtime.   sucralfate 1 GM/10ML suspension Commonly known as: CARAFATE Take 10 mLs (1 g total) by mouth 4 (four) times daily as needed. For breakthrough heartburn   traMADol 50 MG tablet Commonly known as: ULTRAM 50 mg daily as needed.   Vitamin D 50 MCG (2000 UT) Caps Take 2,000 Units by  mouth daily.       Discharge Assessment: Vitals:   05/06/19 0018 05/06/19 0428  BP: 140/82 136/78  Pulse: 61 66  Resp: 18   Temp: 98.5 F (36.9 C) 98.2 F (36.8 C)  SpO2: 97% 97%   Skin clean, dry and intact without evidence of skin break down, no evidence of skin tears noted.   D/c Instructions-Education: Discharge instructions given to patient/family with verbalized understanding. D/c education completed with patient/family including follow up instructions, medication list, d/c activities limitations if indicated, with other d/c instructions as indicated by MD - patient able to verbalize  understanding, all questions fully answered. Patient instructed to return to ED, call 911, or call MD for any changes in condition.  Patient escorted via [place], and D/C home via private auto.  Zachery Conch, RN 05/06/2019 1:32 PM

## 2019-05-22 ENCOUNTER — Other Ambulatory Visit (HOSPITAL_COMMUNITY): Payer: Self-pay | Admitting: Nurse Practitioner

## 2019-05-22 ENCOUNTER — Encounter: Payer: Self-pay | Admitting: Gastroenterology

## 2019-05-22 DIAGNOSIS — C189 Malignant neoplasm of colon, unspecified: Secondary | ICD-10-CM

## 2019-05-22 MED ORDER — GABAPENTIN 100 MG PO CAPS: 100.0000 mg | ORAL_CAPSULE | Freq: Every day | ORAL | 0 refills | Status: DC

## 2019-05-23 ENCOUNTER — Encounter (HOSPITAL_COMMUNITY): Payer: Self-pay

## 2019-05-23 ENCOUNTER — Inpatient Hospital Stay (HOSPITAL_COMMUNITY): Payer: Medicare Other | Attending: Hematology

## 2019-05-23 ENCOUNTER — Other Ambulatory Visit: Payer: Self-pay

## 2019-05-23 DIAGNOSIS — D508 Other iron deficiency anemias: Secondary | ICD-10-CM | POA: Diagnosis not present

## 2019-05-23 DIAGNOSIS — Z9221 Personal history of antineoplastic chemotherapy: Secondary | ICD-10-CM | POA: Insufficient documentation

## 2019-05-23 DIAGNOSIS — Z95828 Presence of other vascular implants and grafts: Secondary | ICD-10-CM

## 2019-05-23 DIAGNOSIS — Z85038 Personal history of other malignant neoplasm of large intestine: Secondary | ICD-10-CM | POA: Insufficient documentation

## 2019-05-23 DIAGNOSIS — I10 Essential (primary) hypertension: Secondary | ICD-10-CM | POA: Insufficient documentation

## 2019-05-23 DIAGNOSIS — N189 Chronic kidney disease, unspecified: Secondary | ICD-10-CM | POA: Insufficient documentation

## 2019-05-23 DIAGNOSIS — C189 Malignant neoplasm of colon, unspecified: Secondary | ICD-10-CM

## 2019-05-23 DIAGNOSIS — Z87891 Personal history of nicotine dependence: Secondary | ICD-10-CM | POA: Diagnosis not present

## 2019-05-23 DIAGNOSIS — Z86711 Personal history of pulmonary embolism: Secondary | ICD-10-CM | POA: Diagnosis not present

## 2019-05-23 DIAGNOSIS — G629 Polyneuropathy, unspecified: Secondary | ICD-10-CM | POA: Insufficient documentation

## 2019-05-23 DIAGNOSIS — Z79899 Other long term (current) drug therapy: Secondary | ICD-10-CM | POA: Diagnosis not present

## 2019-05-23 LAB — CBC WITH DIFFERENTIAL/PLATELET
Abs Immature Granulocytes: 0.02 10*3/uL (ref 0.00–0.07)
Basophils Absolute: 0 10*3/uL (ref 0.0–0.1)
Basophils Relative: 0 %
Eosinophils Absolute: 0.3 10*3/uL (ref 0.0–0.5)
Eosinophils Relative: 3 %
HCT: 44.5 % (ref 39.0–52.0)
Hemoglobin: 14.3 g/dL (ref 13.0–17.0)
Immature Granulocytes: 0 %
Lymphocytes Relative: 26 %
Lymphs Abs: 2.2 10*3/uL (ref 0.7–4.0)
MCH: 30.8 pg (ref 26.0–34.0)
MCHC: 32.1 g/dL (ref 30.0–36.0)
MCV: 95.7 fL (ref 80.0–100.0)
Monocytes Absolute: 0.8 10*3/uL (ref 0.1–1.0)
Monocytes Relative: 9 %
Neutro Abs: 5 10*3/uL (ref 1.7–7.7)
Neutrophils Relative %: 62 %
Platelets: 153 10*3/uL (ref 150–400)
RBC: 4.65 MIL/uL (ref 4.22–5.81)
RDW: 13.6 % (ref 11.5–15.5)
WBC: 8.2 10*3/uL (ref 4.0–10.5)
nRBC: 0 % (ref 0.0–0.2)

## 2019-05-23 LAB — COMPREHENSIVE METABOLIC PANEL WITH GFR
ALT: 18 U/L (ref 0–44)
AST: 20 U/L (ref 15–41)
Albumin: 3.7 g/dL (ref 3.5–5.0)
Alkaline Phosphatase: 63 U/L (ref 38–126)
Anion gap: 8 (ref 5–15)
BUN: 8 mg/dL (ref 8–23)
CO2: 25 mmol/L (ref 22–32)
Calcium: 8.5 mg/dL — ABNORMAL LOW (ref 8.9–10.3)
Chloride: 107 mmol/L (ref 98–111)
Creatinine, Ser: 1.34 mg/dL — ABNORMAL HIGH (ref 0.61–1.24)
GFR calc Af Amer: >60 mL/min
GFR calc non Af Amer: 54 mL/min — ABNORMAL LOW
Glucose, Bld: 105 mg/dL — ABNORMAL HIGH (ref 70–99)
Potassium: 4.5 mmol/L (ref 3.5–5.1)
Sodium: 140 mmol/L (ref 135–145)
Total Bilirubin: 0.5 mg/dL (ref 0.3–1.2)
Total Protein: 6.5 g/dL (ref 6.5–8.1)

## 2019-05-23 LAB — IRON AND TIBC
Iron: 113 ug/dL (ref 45–182)
Saturation Ratios: 37 % (ref 17.9–39.5)
TIBC: 306 ug/dL (ref 250–450)
UIBC: 193 ug/dL

## 2019-05-23 LAB — FERRITIN: Ferritin: 96 ng/mL (ref 24–336)

## 2019-05-23 MED ORDER — HEPARIN SOD (PORK) LOCK FLUSH 100 UNIT/ML IV SOLN
500.0000 [IU] | Freq: Once | INTRAVENOUS | Status: AC
  Administered 2019-05-23: 500 [IU] via INTRAVENOUS

## 2019-05-23 MED ORDER — SODIUM CHLORIDE 0.9% FLUSH
10.0000 mL | INTRAVENOUS | Status: DC | PRN
  Administered 2019-05-23: 10 mL via INTRAVENOUS

## 2019-05-24 LAB — CEA: CEA: 2 ng/mL (ref 0.0–4.7)

## 2019-05-24 NOTE — Telephone Encounter (Signed)
Please schedule patient for ov with RMR only. Persistent diarrhea, refractory gerd, pancreatic insufficency.

## 2019-05-28 ENCOUNTER — Encounter: Payer: Self-pay | Admitting: Internal Medicine

## 2019-05-28 ENCOUNTER — Other Ambulatory Visit (HOSPITAL_COMMUNITY): Payer: Self-pay | Admitting: Nurse Practitioner

## 2019-05-28 ENCOUNTER — Telehealth: Payer: Self-pay | Admitting: Gastroenterology

## 2019-05-28 DIAGNOSIS — C189 Malignant neoplasm of colon, unspecified: Secondary | ICD-10-CM

## 2019-05-28 NOTE — Telephone Encounter (Signed)
Please schedule patient for ov with RMR only. Persistent diarrhea, refractory gerd, pancreatic insufficency.

## 2019-05-29 ENCOUNTER — Inpatient Hospital Stay (HOSPITAL_BASED_OUTPATIENT_CLINIC_OR_DEPARTMENT_OTHER): Payer: Medicare Other | Admitting: Nurse Practitioner

## 2019-05-29 ENCOUNTER — Other Ambulatory Visit: Payer: Self-pay

## 2019-05-29 ENCOUNTER — Encounter (HOSPITAL_COMMUNITY): Payer: Self-pay | Admitting: Nurse Practitioner

## 2019-05-29 DIAGNOSIS — Z85038 Personal history of other malignant neoplasm of large intestine: Secondary | ICD-10-CM | POA: Diagnosis not present

## 2019-05-29 DIAGNOSIS — C189 Malignant neoplasm of colon, unspecified: Secondary | ICD-10-CM | POA: Diagnosis not present

## 2019-05-29 MED ORDER — GABAPENTIN 100 MG PO CAPS: 100.0000 mg | ORAL_CAPSULE | Freq: Every day | ORAL | 0 refills | Status: DC

## 2019-05-29 NOTE — Assessment & Plan Note (Addendum)
1.  Stage III adenocarcinoma the left colon: -Status post surgical resection in May 2012, 1 out of 18 lymph nodes positive, T3N1 -Completed only 7 out of 12 FOLFOX chemotherapy secondary to neuropathy. -Last EGD/colonoscopy on 05/15/2015 was normal.-CT scan in October 2018 was negative for metastatic disease. -Reportedly was admitted to [facility] in July and had a bowel resection done secondary to small bowel obstruction. -Labs done on 05/23/2019 showed potassium 4.5, creatinine 1.34, hemoglobin 14.3, WBC 8.2, platelets 153.  CEA 2.9 -He will follow-up in 6 months with repeat labs.  2.  Iron deficiency: -This is from combination of CKD and relative iron deficiency. -Last Feraheme on 08/29/2018 and 09/05/2018. -Labs done on 05/23/2019 showed hemoglobin 14.3, ferritin 96, percent saturation 37 -He does not need any IV iron at this time. -We will continue to monitor.  3.  Peripheral neuropathy: -Neuropathy with tingling and numbness in the feet. -She is taking gabapentin 100 mg at bedtime which is helping with the pins and needle sensation. -We refilled his prescription for gabapentin today.

## 2019-05-29 NOTE — Progress Notes (Signed)
[name], [place] [address]   CLINIC:  Medical Oncology/Hematology  PCP:  [name], MD [place] [address] [phone]   REASON FOR VISIT: Follow-up for colon cancer  CURRENT THERAPY: Surveillance   CANCER STAGING: Cancer Staging Adenocarcinoma of colon with mucinous features Staging form: Colon and Rectum, AJCC 7th Edition - Clinical: Stage IIIB (T3, N1, M0) - Signed by Baird Cancer, PA on 09/22/2010    INTERVAL HISTORY:  [name] 69 y.o. male returns for routine follow-up for colon cancer.  Patient reports he is doing well since his last visit.  He denies any change in bowel movements.  He denies any bright red bleeding per rectum or melena.  He denies any easy bruising or bleeding. Denies any nausea, vomiting, or diarrhea. Denies any new pains. Had not noticed any recent bleeding such as epistaxis, hematuria or hematochezia. Denies recent chest pain on exertion, shortness of breath on minimal exertion, pre-syncopal episodes, or palpitations. Denies any numbness or tingling in hands or feet. Denies any recent fevers, infections, or recent hospitalizations. Patient reports appetite at 100% and energy level at 75%.  He is eating well maintaining his weight at this time.     REVIEW OF SYSTEMS:  Review of Systems  Gastrointestinal: Positive for diarrhea.  Neurological: Positive for numbness.  All other systems reviewed and are negative.    PAST MEDICAL/SURGICAL HISTORY:  Past Medical History:  Diagnosis Date  . 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 Anemia   . Anxiety   . Arthritis   . Barrett's esophagus   . Blood transfusion   . Bowel obstruction ([place]) 05/13/2012   Recurrent  . Bronchitis   . Chest pain at rest   . Chronic abdominal pain   . Erosive esophagitis   . ETOH abuse    quit 03/2010  . GERD (gastroesophageal reflux disease)   . Hx of Clostridium  difficile infection 01/2012  . Hypertension   . Ileus ([place])   . Iron deficiency anemia 03/23/2016  . Obstruction of bowel ([place]) 03/03/14  . Osteoporosis   . Personal history of PE (pulmonary embolism) 10/01/2010  . Pneumonia   . Pulmonary embolism ([place]) 02/2010  . Recurrent upper respiratory infection (URI)   . Renal disorder   . S/P endoscopy September 28, 2010   erosive reflux esophagitis, Billroth I anatomy  . 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 Seizures ([place])   . Shortness of breath   . TIA (transient ischemic attack) 10/11  . Vitamin B12 deficiency    Past Surgical History:  Procedure Laterality Date  . ABDOMINAL ADHESION SURGERY  03/04/15   @ [facility]  . ABDOMINAL EXPLORATION SURGERY    . abdominal sugery     for bowel obstruction x 8, all in 1980s, except for one in 07/2010  . APPENDECTOMY  1980s  . Billroth 1 hemigastrectomy  1980s   per patient for benign duodenal tumor  . CARDIAC CATHETERIZATION  07/17/2012  . CHOLECYSTECTOMY  1980s  . COLON SURGERY  May 2012   left hemicolectomy, colon cancer found at time of surgery for bowel obstruction  . COLON SURGERY  10/12/2018   At [facility]: Resection of right colon and terminal ileum with creation of ileorectal anastomosis for large bowel obstruction.  Cecum and ascending colon noted to be attached to the rectum.  . COLONOSCOPY  03/18/2011   anastomosis at 35cm. Several adenomatous  polyps removed. Sigmoid diverticulosis. Next TCS 02/2013  . COLONOSCOPY N/A 07/24/2012   ACZ:YSAYTK post segmental resection with normal-appearing colonic anastomosis aside from an adjacent polyp-removed as described above. Rectal polyp-removed as described above. CT findings appear to have been artifactual. tubular adenomas/prolapsed type polyp.  . COLONOSCOPY N/A 05/15/2015   Procedure: COLONOSCOPY;  Surgeon: [name], MD;  [facility];  Service: Endoscopy;  Laterality: N/A;  . COLONOSCOPY  09/26/2018   at [facility]: prep not adequate for colon cancer  surveillance.  Prior end-to-end colonic anastomosis in the rectosigmoid region.  This was patent and characterized by healthy-appearing mucosa.  Anastomosis was traversed.  Normal terminal ileum.  . COLONOSCOPY WITH PROPOFOL N/A 11/04/2016   Dr. [name], status post subtotal colectomy with normal-appearing residual lower GI tract.  Next colonoscopy in 5 years.  . ESOPHAGOGASTRODUODENOSCOPY  09/28/2010  . ESOPHAGOGASTRODUODENOSCOPY  12/01/2010   Cervical web status post dilation, erosive esophagitis, B1 hemigastrectomy, inflamed anastomosis  . ESOPHAGOGASTRODUODENOSCOPY  04/16/2011   excoriation at [facility] c/w trauma/M-W tear, friable gastric anastomosis, dilation efferent limb  . ESOPHAGOGASTRODUODENOSCOPY N/A 06/03/2014   Dr.[name]- cervcal esopphageal web s/p dilation. abnormal distal esophagus bx= barretts esophagus  . ESOPHAGOGASTRODUODENOSCOPY N/A 05/15/2015   Procedure: ESOPHAGOGASTRODUODENOSCOPY (EGD);  Surgeon: [name], MD;  [facility];  Service: Endoscopy;  Laterality: N/A;  230  . ESOPHAGOGASTRODUODENOSCOPY (EGD) WITH ESOPHAGEAL DILATION  02/25/2012   ZSW:FUXNATFT esophageal web-s/p dilation anddisruption as described above. Status post prior gastric with Billroth I configuration. Abnormal gastric mucosa at the anastomosis. Gastric biopsy showed mild chronic inflammation but no H. pylori   . ESOPHAGOGASTRODUODENOSCOPY (EGD) WITH PROPOFOL N/A 11/04/2016   Dr. [name]: Reflux esophagitis, small hiatal hernia status post hemigastrectomy.  Single patent efferent small bowel limb appeared normal, 2 x 2 centimeter tongue of salmon epithelium again seen, esophageal dilation.  Biopsies consistent with reflux changes, not Barrett's however this was confirmed on prior EGDs.  Offer 3-year follow-up EGD August 2021.  [name] HERNIA REPAIR     right inguinal  . MALONEY DILATION N/A 06/03/2014   Procedure: [name] DILATION;  Surgeon: [name], MD;  [facility];  Service: Endoscopy;   Laterality: N/A;  [name] DILATION N/A 05/15/2015   Procedure: [name] DILATION;  Surgeon: [name], MD;  [facility];  Service: Endoscopy;  Laterality: N/A;  . [name] DILATION N/A 11/04/2016   Procedure: [name] DILATION;  Surgeon: [name], MD;  [facility];  Service: Endoscopy;  Laterality: N/A;  . PORTACATH PLACEMENT    . SAVORY DILATION N/A 06/03/2014   Procedure: SAVORY DILATION;  Surgeon: [name], MD;  [facility];  Service: Endoscopy;  Laterality: N/A;     SOCIAL HISTORY:  Social History   Socioeconomic History  . Marital status: Married    Spouse name: Not on file  . Number of children: 3  . Years of education: Not on file  . Highest education level: Not on file  Occupational History    Employer: [employer]  Tobacco Use  . Smoking status: Former Smoker    Packs/day: 0.50    Years: 40.00    Pack years: 20.00    Types: Cigarettes    Quit date: 12/20/2012    Years since quitting: 6.4  . Smokeless tobacco: Never Used  Substance and Sexual Activity  . Alcohol use: No  . Drug use: No  . Sexual activity: Never  Other Topics Concern  .  Not on file  Social History Narrative  . Not on file   Social Determinants of Health   Financial Resource Strain:   . Difficulty of Paying Living Expenses: Not on file  Food Insecurity:   . Worried About Charity fundraiser in the Last Year: Not on file  . Ran Out of Food in the Last Year: Not on file  Transportation Needs:   . Lack of Transportation (Medical): Not on file  . Lack of Transportation (Non-Medical): Not on file  Physical Activity:   . Days of Exercise per Week: Not on file  . Minutes of Exercise per Session: Not on file  Stress:   . Feeling of Stress : Not on file  Social Connections:   . Frequency of Communication with Friends and Family: Not on file  . Frequency of Social Gatherings with Friends and Family: Not on file  . Attends Religious Services: Not  on file  . Active Member of Clubs or Organizations: Not on file  . Attends Archivist Meetings: Not on file  . Marital Status: Not on file  Intimate Partner Violence:   . Fear of Current or Ex-Partner: Not on file  . Emotionally Abused: Not on file  . Physically Abused: Not on file  . Sexually Abused: Not on file    FAMILY HISTORY:  Family History  Problem Relation Age of Onset  . Hypertension Mother   . Arthritis Mother   . Pneumonia Mother   . Hypertension Father   . Heart attack Father   . Colon cancer Neg Hx     CURRENT MEDICATIONS:  Outpatient Encounter Medications as of 05/29/2019  Medication Sig Note  . acetaminophen (TYLENOL) 325 MG tablet TAKE 3 TABLETS BY MOUTH EVERY 6 HOURS AS NEEDED FOR UP TO 10 DAYS   . ammonium lactate (AMLACTIN) 12 % cream APPLY CREAM TO DRY SKIN ON LEGS ARMS HANDS AND BACK AREA ONCE TO TWICE DAILY (AVOID FACE GROIN UNDERARMS)   . atorvastatin (LIPITOR) 20 MG tablet Take 20 mg by mouth at bedtime.    . carvedilol (COREG) 25 MG tablet Take 0.5 tablets (12.5 mg total) by mouth 2 (two) times daily with a meal.   . Cholecalciferol (VITAMIN D) 2000 units CAPS Take 2,000 Units by mouth daily.   [name] enoxaparin (LOVENOX) 60 MG/0.6ML injection Inject 0.6 mLs (60 mg total) into the skin daily.   [name] lidocaine-prilocaine (EMLA) cream Apply 1 application topically as needed (prior to accessing port).   . lipase/protease/amylase (CREON) 36000 UNITS CPEP capsule Take 2 capsules with meals and 1 capsule with snacks.   . loperamide (IMODIUM) 2 MG capsule Take 1-2 tablets by mouth up to 4X/day as needed before meals.  Titrate amount based on stool consistency   . LORazepam (ATIVAN) 1 MG tablet Take 1 tablet (1 mg total) by mouth every 12 (twelve) hours as needed for anxiety or sleep.   . mirtazapine (REMERON) 15 MG tablet Take 1 tablet (15 mg total) by mouth at bedtime.   . Multiple Vitamin (MULTIVITAMIN WITH MINERALS) TABS tablet Take 1 tablet by mouth daily.     . Nutritional Supplements (ENSURE PLUS HN) LIQD Take 1 Bottle daily by mouth.    . ondansetron (ZOFRAN ODT) 4 MG disintegrating tablet Take 1 tablet (4 mg total) by mouth every 8 (eight) hours as needed.   . pantoprazole (PROTONIX) 40 MG tablet Take 1 tablet (40 mg total) by mouth 2 (two) times daily before a meal.   .  polyethylene glycol (MIRALAX / GLYCOLAX) 17 g packet Take 17 g by mouth daily.   . Probiotic Product (PROBIOTIC PO) Take 1 tablet by mouth daily.   . prochlorperazine (COMPAZINE) 10 MG tablet Take 10 mg by mouth every 6 (six) hours as needed for nausea or vomiting.    . senna-docusate (SENOKOT-S) 8.6-50 MG tablet Take 2 tablets by mouth at bedtime.   . sucralfate (CARAFATE) 1 GM/10ML suspension Take 10 mLs (1 g total) by mouth 4 (four) times daily as needed. For breakthrough heartburn   . traMADol (ULTRAM) 50 MG tablet 50 mg daily as needed.    . zoledronic acid (RECLAST) 5 MG/100ML SOLN injection Inject 5 mg into the vein See admin instructions. Pt gets a dose yearly. Due again 2018 05/03/2019: 06-21-2018  . [DISCONTINUED] gabapentin (NEURONTIN) 100 MG capsule Take 1 capsule (100 mg total) by mouth at bedtime.   . [DISCONTINUED] gabapentin (NEURONTIN) 100 MG capsule Take 1 capsule by mouth at bedtime    Facility-Administered Encounter Medications as of 05/29/2019  Medication  . heparin lock flush 100 unit/mL  . sodium chloride flush (NS) 0.9 % injection 10 mL    ALLERGIES:  No Known Allergies   PHYSICAL EXAM:  ECOG Performance status: 1  Vitals:   05/29/19 1100  BP: 129/68  Pulse: 69  Resp: 16  Temp: (!) 97.3 F (36.3 C)  SpO2: 98%   Filed Weights   05/29/19 1100  Weight: 159 lb 1 oz (72.2 kg)    Physical Exam Constitutional:      Appearance: Normal appearance. He is normal weight.  Cardiovascular:     Rate and Rhythm: Normal rate and regular rhythm.     Heart sounds: Normal heart sounds.  Pulmonary:     Effort: Pulmonary effort is normal.     Breath  sounds: Normal breath sounds.  Abdominal:     General: Bowel sounds are normal.     Palpations: Abdomen is soft.  Musculoskeletal:        General: Normal range of motion.  Skin:    General: Skin is warm.  Neurological:     Mental Status: He is alert and oriented to person, place, and time. Mental status is at baseline.  Psychiatric:        Mood and Affect: Mood normal.        Behavior: Behavior normal.        Thought Content: Thought content normal.        Judgment: Judgment normal.      LABORATORY DATA:  I have reviewed the labs as listed.  CBC    Component Value Date/Time   WBC 8.2 05/23/2019 1017   RBC 4.65 05/23/2019 1017   HGB 14.3 05/23/2019 1017   HCT 44.5 05/23/2019 1017   PLT 153 05/23/2019 1017   MCV 95.7 05/23/2019 1017   MCH 30.8 05/23/2019 1017   MCHC 32.1 05/23/2019 1017   RDW 13.6 05/23/2019 1017   LYMPHSABS 2.2 05/23/2019 1017   MONOABS 0.8 05/23/2019 1017   EOSABS 0.3 05/23/2019 1017   BASOSABS 0.0 05/23/2019 1017   CMP Latest Ref Rng & Units 05/23/2019 05/06/2019 05/05/2019  Glucose 70 - 99 mg/dL 105(H) 94 98  BUN 8 - 23 mg/dL 8 9 10   Creatinine 0.61 - 1.24 mg/dL 1.34(H) 1.21 1.28(H)  Sodium 135 - 145 mmol/L 140 138 140  Potassium 3.5 - 5.1 mmol/L 4.5 3.7 3.8  Chloride 98 - 111 mmol/L 107 105 107  CO2 22 - 32 mmol/L  25 24 25   Calcium 8.9 - 10.3 mg/dL 8.5(L) 8.6(L) 8.2(L)  Total Protein 6.5 - 8.1 g/dL 6.5 5.9(L) 5.8(L)  Total Bilirubin 0.3 - 1.2 mg/dL 0.5 0.8 0.9  Alkaline Phos 38 - 126 U/L 63 51 49  AST 15 - 41 U/L 20 14(L) 16  ALT 0 - 44 U/L 18 21 26      I personally performed a face-to-face visit.  All questions were answered to patient's stated satisfaction. Encouraged patient to call with any new concerns or questions before his next visit to the cancer center and we can certain see him sooner, if needed.     ASSESSMENT & PLAN:   Adenocarcinoma of colon with mucinous features 1.  Stage III adenocarcinoma the left colon: -Status post  surgical resection in May 2012, 1 out of 18 lymph nodes positive, T3N1 -Completed only 7 out of 12 FOLFOX chemotherapy secondary to neuropathy. -Last EGD/colonoscopy on 05/15/2015 was normal.-CT scan in October 2018 was negative for metastatic disease. -Reportedly was admitted to [facility] in July and had a bowel resection done secondary to small bowel obstruction. -Labs done on 05/23/2019 showed potassium 4.5, creatinine 1.34, hemoglobin 14.3, WBC 8.2, platelets 153.  CEA 2.9 -He will follow-up in 6 months with repeat labs.  2.  Iron deficiency: -This is from combination of CKD and relative iron deficiency. -Last Feraheme on 08/29/2018 and 09/05/2018. -Labs done on 05/23/2019 showed hemoglobin 14.3, ferritin 96, percent saturation 37 -He does not need any IV iron at this time. -We will continue to monitor.  3.  Peripheral neuropathy: -Neuropathy with tingling and numbness in the feet. -She is taking gabapentin 100 mg at bedtime which is helping with the pins and needle sensation. -We refilled his prescription for gabapentin today.      Orders placed this encounter:  Orders Placed This Encounter  Procedures  . CEA  . Lactate dehydrogenase  . CBC with Differential/Platelet  . Comprehensive metabolic panel  . Ferritin  . Iron and TIBC  . Vitamin B12  . VITAMIN D 25 Hydroxy (Vit-D Deficiency, Fractures)      Francene Finders, FNP-C [place] [phone]

## 2019-05-29 NOTE — Patient Instructions (Signed)
[facility] at [facility] Discharge Instructions  Follow up in 6 months with labs    Thank you for choosing [facility] at [facility] to provide your oncology and hematology care.  To afford each patient quality time with our provider, please arrive at least 15 minutes before your scheduled appointment time.   If you have a lab appointment with the Cancer Center please come in thru the Main Entrance and check in at the main information desk.  You need to re-schedule your appointment should you arrive 10 or more minutes late.  We strive to give you quality time with our providers, and arriving late affects you and other patients whose appointments are after yours.  Also, if you no show three or more times for appointments you may be dismissed from the clinic at the providers discretion.     Again, thank you for choosing [facility].  Our hope is that these requests will decrease the amount of time that you wait before being seen by our physicians.       _____________________________________________________________  Should you have questions after your visit to [facility], please contact our office at [phone] between the hours of 8:00 a.m. and 4:30 p.m.  Voicemails left after 4:00 p.m. will not be returned until the following business day.  For prescription refill requests, have your pharmacy contact our office and allow 72 hours.    Due to Covid, you will need to wear a mask upon entering the hospital. If you do not have a mask, a mask will be given to you at the Main Entrance upon arrival. For doctor visits, patients may have 1 support person with them. For treatment visits, patients can not have anyone with them due to social distancing guidelines and our immunocompromised population.      

## 2019-06-13 ENCOUNTER — Other Ambulatory Visit (HOSPITAL_COMMUNITY): Payer: Self-pay | Admitting: Hematology

## 2019-06-13 DIAGNOSIS — R630 Anorexia: Secondary | ICD-10-CM

## 2019-06-13 DIAGNOSIS — C189 Malignant neoplasm of colon, unspecified: Secondary | ICD-10-CM

## 2019-06-29 ENCOUNTER — Other Ambulatory Visit (HOSPITAL_COMMUNITY): Payer: Self-pay | Admitting: Nurse Practitioner

## 2019-06-29 DIAGNOSIS — C189 Malignant neoplasm of colon, unspecified: Secondary | ICD-10-CM

## 2019-06-29 MED ORDER — GABAPENTIN 100 MG PO CAPS: 100.0000 mg | ORAL_CAPSULE | Freq: Every day | ORAL | 2 refills | Status: DC

## 2019-07-12 ENCOUNTER — Other Ambulatory Visit (HOSPITAL_COMMUNITY): Payer: Self-pay

## 2019-07-12 DIAGNOSIS — C189 Malignant neoplasm of colon, unspecified: Secondary | ICD-10-CM

## 2019-07-12 DIAGNOSIS — R630 Anorexia: Secondary | ICD-10-CM

## 2019-07-12 MED ORDER — MIRTAZAPINE 30 MG PO TABS: 30.0000 mg | ORAL_TABLET | Freq: Every day | ORAL | 0 refills | Status: DC

## 2019-07-13 ENCOUNTER — Ambulatory Visit: Payer: Medicare Other | Admitting: Gastroenterology

## 2019-07-17 ENCOUNTER — Ambulatory Visit (INDEPENDENT_AMBULATORY_CARE_PROVIDER_SITE_OTHER): Payer: Medicare Other | Admitting: Internal Medicine

## 2019-07-17 ENCOUNTER — Encounter: Payer: Self-pay | Admitting: Internal Medicine

## 2019-07-17 ENCOUNTER — Other Ambulatory Visit: Payer: Self-pay

## 2019-07-17 VITALS — BP 157/84 | HR 65 | Temp 97.1°F | Ht 69.0 in | Wt 152.6 lb

## 2019-07-17 DIAGNOSIS — R634 Abnormal weight loss: Secondary | ICD-10-CM | POA: Diagnosis not present

## 2019-07-17 DIAGNOSIS — K2100 Gastro-esophageal reflux disease with esophagitis, without bleeding: Secondary | ICD-10-CM | POA: Diagnosis not present

## 2019-07-17 MED ORDER — CHOLESTYRAMINE POWD: 2 refills | Status: DC

## 2019-07-17 NOTE — Progress Notes (Signed)
Primary Care Physician:  [name], MD Primary Gastroenterologist:  Dr. [name]  Pre-Procedure History & Physical: HPI:  [name] is a 70 y.o. male here  for follow-up of chronic diarrhea, weight loss.  History of adenocarcinoma, status post multiple surgeries.  Recent surgery left him with ileorectal anastomosis.  Colon cancer felt to be remission followed by oncology on 4 th floor here.  History of pancreatic exocrine insufficiency; Creon dose escalated to 3 capsules with meals 2 with snacks at last office visit.  Has had multiple SBO's which have resolved with conservative management last several weeks ago for which she was admitted to [facility].  He continues on Protonix once to twice daily for control of reflux symptoms;  occasionally takes Carafate.  Has 6 bowel movements daily with Imodium 2-3 times daily.  Diarrhea would be unmanageable if he did not take Imodium as he reports.  He is lost 6 pounds since his last visit.  He is not passing any blood.  He states if he lies supine in bed he does pretty good at night; if he lies on his side he tends to have nocturnal stooling. Prior stool studies,  celiac screen came back negative.  He does take a vitamin supplement daily.  Due for surveillance colonoscopy (will be a limited sigmoidoscopy);  need an EGD for history of short segment Barrett's 2023.  Provider is out. Past Medical History:  Diagnosis Date  . 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 Anemia   . Anxiety   . Arthritis   . Barrett's esophagus   . Blood transfusion   . Bowel obstruction ([place]) 05/13/2012   Recurrent  . Bronchitis   . Chest pain at rest   . Chronic abdominal pain   . Erosive esophagitis   . ETOH abuse    quit 03/2010  . GERD (gastroesophageal reflux disease)   . Hx of Clostridium difficile infection 01/2012  . Hypertension   . Ileus ([place])   . Iron deficiency anemia 03/23/2016  . Obstruction of bowel ([place]) 03/03/14  .  Osteoporosis   . Personal history of PE (pulmonary embolism) 10/01/2010  . Pneumonia   . Pulmonary embolism ([place]) 02/2010  . Recurrent upper respiratory infection (URI)   . Renal disorder   . S/P endoscopy September 28, 2010   erosive reflux esophagitis, Billroth I anatomy  . 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 Seizures ([place])   . Shortness of breath   . TIA (transient ischemic attack) 10/11  . Vitamin B12 deficiency     Past Surgical History:  Procedure Laterality Date  . ABDOMINAL ADHESION SURGERY  03/04/15   @ [facility]  . ABDOMINAL EXPLORATION SURGERY    . abdominal sugery     for bowel obstruction x 8, all in 1980s, except for one in 07/2010  . APPENDECTOMY  1980s  . Billroth 1 hemigastrectomy  1980s   per patient for benign duodenal tumor  . CARDIAC CATHETERIZATION  07/17/2012  . CHOLECYSTECTOMY  1980s  . COLON SURGERY  May 2012   left hemicolectomy, colon cancer found at time of surgery for bowel obstruction  . COLON SURGERY  10/12/2018   At [facility]: Resection of right colon and terminal ileum with creation of ileorectal anastomosis for large bowel obstruction.  Cecum and ascending colon noted to be attached to the rectum.  . COLONOSCOPY  03/18/2011   anastomosis at 35cm. Several adenomatous polyps removed. Sigmoid diverticulosis. Next TCS 02/2013  .  COLONOSCOPY N/A 07/24/2012   HQP:RFFMBW post segmental resection with normal-appearing colonic anastomosis aside from an adjacent polyp-removed as described above. Rectal polyp-removed as described above. CT findings appear to have been artifactual. tubular adenomas/prolapsed type polyp.  . COLONOSCOPY N/A 05/15/2015   Procedure: COLONOSCOPY;  Surgeon: [name], MD;  [facility];  Service: Endoscopy;  Laterality: N/A;  . COLONOSCOPY  09/26/2018   at [facility]: prep not adequate for colon cancer surveillance.  Prior end-to-end colonic anastomosis in the rectosigmoid region.  This was patent and characterized by healthy-appearing mucosa.   Anastomosis was traversed.  Normal terminal ileum.  . COLONOSCOPY WITH PROPOFOL N/A 11/04/2016   Dr. [name], status post subtotal colectomy with normal-appearing residual lower GI tract.  Next colonoscopy in 5 years.  . ESOPHAGOGASTRODUODENOSCOPY  09/28/2010  . ESOPHAGOGASTRODUODENOSCOPY  12/01/2010   Cervical web status post dilation, erosive esophagitis, B1 hemigastrectomy, inflamed anastomosis  . ESOPHAGOGASTRODUODENOSCOPY  04/16/2011   excoriation at GEJ c/w trauma/M-W tear, friable gastric anastomosis, dilation efferent limb  . ESOPHAGOGASTRODUODENOSCOPY N/A 06/03/2014   Dr.Jaidee Stipe- cervcal esopphageal web s/p dilation. abnormal distal esophagus bx= barretts esophagus  . ESOPHAGOGASTRODUODENOSCOPY N/A 05/15/2015   Procedure: ESOPHAGOGASTRODUODENOSCOPY (EGD);  Surgeon: [name], MD;  [facility];  Service: Endoscopy;  Laterality: N/A;  230  . ESOPHAGOGASTRODUODENOSCOPY (EGD) WITH ESOPHAGEAL DILATION  02/25/2012   GYK:[id number] esophageal web-s/p dilation anddisruption as described above. Status post prior gastric with Billroth I configuration. Abnormal gastric mucosa at the anastomosis. Gastric biopsy showed mild chronic inflammation but no H. pylori   . ESOPHAGOGASTRODUODENOSCOPY (EGD) WITH PROPOFOL N/A 11/04/2016   Dr. [name]: Reflux esophagitis, small hiatal hernia status post hemigastrectomy.  Single patent efferent small bowel limb appeared normal, 2 x 2 centimeter tongue of salmon epithelium again seen, esophageal dilation.  Biopsies consistent with reflux changes, not Barrett's however this was confirmed on prior EGDs.  Offer 3-year follow-up EGD August 2021.  [name] HERNIA REPAIR     right inguinal  . MALONEY DILATION N/A 06/03/2014   Procedure: [name] DILATION;  Surgeon: [name], MD;  [facility];  Service: Endoscopy;  Laterality: N/A;  [name] DILATION N/A 05/15/2015   Procedure: [name] DILATION;  Surgeon: [name], MD;  [facility];  Service:  Endoscopy;  Laterality: N/A;  . [name] DILATION N/A 11/04/2016   Procedure: [name] DILATION;  Surgeon: [name], MD;  [facility];  Service: Endoscopy;  Laterality: N/A;  . PORTACATH PLACEMENT    . SAVORY DILATION N/A 06/03/2014   Procedure: SAVORY DILATION;  Surgeon: [name], MD;  [facility];  Service: Endoscopy;  Laterality: N/A;    Prior to Admission medications   Medication Sig Start Date End Date Taking? Authorizing Provider  ammonium lactate (AMLACTIN) 12 % cream APPLY CREAM TO DRY SKIN ON LEGS ARMS HANDS AND BACK AREA ONCE TO TWICE DAILY (AVOID FACE GROIN UNDERARMS) 11/22/17  Yes [provider]  atorvastatin (LIPITOR) 20 MG tablet Take 20 mg by mouth at bedtime.    Yes [provider]  carvedilol (COREG) 25 MG tablet Take 0.5 tablets (12.5 mg total) by mouth 2 (two) times daily with a meal. Patient taking differently: Take 25 mg by mouth 2 (two) times daily with a meal.  05/06/19  Yes Emokpae, Courage, MD  Cholecalciferol (VITAMIN D) 2000 units CAPS Take 2,000 Units by mouth daily.   Yes [provider]  gabapentin (NEURONTIN) 100 MG capsule Take 1  capsule (100 mg total) by mouth at bedtime. 06/29/19  Yes Lockamy, Randi L, NP-C  lipase/protease/amylase (CREON) 36000 UNITS CPEP capsule Take 2 capsules with meals and 1 capsule with snacks. Patient taking differently: Take 3 capsules with meals and 2 capsule with snacks. 04/25/19  Yes Mahala Menghini, PA-C  loperamide (IMODIUM) 2 MG capsule Take 1-2 tablets by mouth up to 4X/day as needed before meals.  Titrate amount based on stool consistency 11/20/18  Yes [provider]  LORazepam (ATIVAN) 1 MG tablet Take 1 tablet (1 mg total) by mouth every 12 (twelve) hours as needed for anxiety or sleep. 05/06/19  Yes Emokpae, Courage, MD  mirtazapine (REMERON) 30 MG tablet Take 1 tablet (30 mg total) by mouth at bedtime. 07/12/19  Yes Lockamy, Randi L, NP-C  Multiple Vitamin  (MULTIVITAMIN WITH MINERALS) TABS tablet Take 1 tablet by mouth daily.   Yes [provider]  Nutritional Supplements (ENSURE PLUS HN) LIQD Take 1 Bottle daily by mouth.    Yes [provider]  pantoprazole (PROTONIX) 40 MG tablet Take 1 tablet (40 mg total) by mouth 2 (two) times daily before a meal. 04/25/19  Yes Mahala Menghini, PA-C  Probiotic Product (PROBIOTIC PO) Take 1 tablet by mouth daily.   Yes [provider]  sucralfate (CARAFATE) 1 GM/10ML suspension Take 10 mLs (1 g total) by mouth 4 (four) times daily as needed. For breakthrough heartburn 04/25/19  Yes Mahala Menghini, PA-C  traMADol (ULTRAM) 50 MG tablet 50 mg daily as needed.  04/14/18  Yes [provider]  zoledronic acid (RECLAST) 5 MG/100ML SOLN injection Inject 5 mg into the vein See admin instructions. Pt gets a dose yearly. Due again 2018   Yes [provider]  acetaminophen (TYLENOL) 325 MG tablet TAKE 3 TABLETS BY MOUTH EVERY 6 HOURS AS NEEDED FOR UP TO 10 DAYS 10/28/18   [provider]  enoxaparin (LOVENOX) 60 MG/0.6ML injection Inject 0.6 mLs (60 mg total) into the skin daily. Patient not taking: Reported on 07/17/2019 05/06/19   Roxan Hockey, MD  lidocaine-prilocaine (EMLA) cream Apply 1 application topically as needed (prior to accessing port). 05/16/17   Holley Bouche, NP  mirtazapine (REMERON) 15 MG tablet Take 1 tablet (15 mg total) by mouth at bedtime. Patient not taking: Reported on 07/17/2019 05/06/19 05/05/20  Roxan Hockey, MD  ondansetron (ZOFRAN ODT) 4 MG disintegrating tablet Take 1 tablet (4 mg total) by mouth every 8 (eight) hours as needed. Patient not taking: Reported on 07/17/2019 05/10/18   Fredia Sorrow, MD  polyethylene glycol (MIRALAX / GLYCOLAX) 17 g packet Take 17 g by mouth daily. Patient not taking: Reported on 07/17/2019 05/06/19   Roxan Hockey, MD  prochlorperazine (COMPAZINE) 10 MG tablet Take 10 mg by mouth every 6 (six) hours as  needed for nausea or vomiting.     [provider]  senna-docusate (SENOKOT-S) 8.6-50 MG tablet Take 2 tablets by mouth at bedtime. Patient not taking: Reported on 07/17/2019 05/06/19   Roxan Hockey, MD  gabapentin (NEURONTIN) 100 MG capsule Take 1 capsule by mouth at bedtime 12/19/18   Lockamy, Randi L, NP-C    Allergies as of 07/17/2019  . (No Known Allergies)    Family History  Problem Relation Age of Onset  . Hypertension Mother   . Arthritis Mother   . Pneumonia Mother   . Hypertension Father   . Heart attack Father   . Colon cancer Neg Hx     Social  History   Socioeconomic History  . Marital status: Married    Spouse name: Not on file  . Number of children: 3  . Years of education: Not on file  . Highest education level: Not on file  Occupational History    Employer: [employer]  Tobacco Use  . Smoking status: Former Smoker    Packs/day: 0.50    Years: 40.00    Pack years: 20.00    Types: Cigarettes    Quit date: 12/20/2012    Years since quitting: 6.5  . Smokeless tobacco: Never Used  Substance and Sexual Activity  . Alcohol use: No  . Drug use: No  . Sexual activity: Never  Other Topics Concern  . Not on file  Social History Narrative  . Not on file   Social Determinants of Health   Financial Resource Strain:   . Difficulty of Paying Living Expenses:   Food Insecurity:   . Worried About Charity fundraiser in the Last Year:   . Arboriculturist in the Last Year:   Transportation Needs:   . Film/video editor (Medical):   [name] Lack of Transportation (Non-Medical):   Physical Activity:   . Days of Exercise per Week:   . Minutes of Exercise per Session:   Stress:   . Feeling of Stress :   Social Connections:   . Frequency of Communication with Friends and Family:   . Frequency of Social Gatherings with Friends and Family:   . Attends Religious Services:   . Active Member of Clubs or Organizations:   . Attends Archivist  Meetings:   [name] Marital Status:   Intimate Partner Violence:   . Fear of Current or Ex-Partner:   . Emotionally Abused:   [name] Physically Abused:   . Sexually Abused:     Review of Systems: See HPI, otherwise negative ROS  Physical Exam: BP (!) 157/84   Pulse 65   Temp (!) 97.1 F (36.2 C) (Oral)   Ht 5\' 9"  (1.753 m)   Wt 152 lb 9.6 oz (69.2 kg)   BMI 22.54 kg/m  General:   Alert,  pleasant and cooperative in NAD Lungs:  Clear throughout to auscultation.   No wheezes, crackles, or rhonchi. No acute distress. Heart:  Regular rate and rhythm; no murmurs, clicks, rubs,  or gallops. Abdomen: Nondistended.  Positive bowel sounds.  Well-healed vertical laparotomy scar.  Abdomen is soft and nontender other functional management.   Pulses:  Normal pulses noted. Extremities:  Without clubbing or edema.  Impression/Plan: 69 year old gentleman with a history of adenocarcinoma of the colon -  status post resection requiring multiple's subsequent surgeries.  Now left with an ileorectal anastomosis.  History of pancreatic exocrine insufficiency.  Multiple small bowel obstructions.  GERD and short segment Barrett's esophagus. Ongoing diarrhea and weight loss continues to be bothersome.  He is on a good dose of pancreatic enzyme supplements with concomitant acid suppression therapy. Although I do not have all his operative notes reviewed, I suspect most of the small bowel remains intact.  . Diarrhea likely multifactorial in etiology i.e. pancreatic exocrine insufficiency, status post colectomy (lacks a colonic "brake") and cholecystectomy.   Recommendations:   Continue protonix and Creon without change  May continue using imodium and carafate as needed  Begin Cholestyramine 2 grams orally daily - do not take within 2 hours before or after other medications (disp 30 doses with 2 refills)  Daily multi-vitamin supplememt (needs to  contain VIT D,E, A and K)  Keep at stool diary and bring it with  you to your next appointment  OV in 6 weeks  Plan for surveillance colonoscopy and EGD in 2023    Notice: This dictation was prepared with Dragon dictation along with smaller phrase technology. Any transcriptional errors that result from this process are unintentional and may not be corrected upon review.

## 2019-07-17 NOTE — Patient Instructions (Signed)
Continue protonix and Creon without change  May continue using imodium and carafate as needed  Begin Cholestyramine 2 grams orally daily - do not take within 2 hours before or after other medications (disp 30 doses with 2 refills)  Daily multi-vitamin supplememt (needs to contain VIT D,E, A and K)  Keep at stool diary and bring it with you to your next appointment  OV in 6 weeks  Plan for surveillance colonoscopy and EGD in 2023

## 2019-07-25 ENCOUNTER — Ambulatory Visit: Payer: Medicare Other | Admitting: Gastroenterology

## 2019-08-13 ENCOUNTER — Other Ambulatory Visit (HOSPITAL_COMMUNITY): Payer: Self-pay | Admitting: Nurse Practitioner

## 2019-08-13 DIAGNOSIS — R630 Anorexia: Secondary | ICD-10-CM

## 2019-08-13 DIAGNOSIS — C189 Malignant neoplasm of colon, unspecified: Secondary | ICD-10-CM

## 2019-08-13 MED ORDER — MIRTAZAPINE 30 MG PO TABS: 30.0000 mg | ORAL_TABLET | Freq: Every day | ORAL | 6 refills | Status: DC

## 2019-08-24 ENCOUNTER — Other Ambulatory Visit: Payer: Self-pay

## 2019-08-24 ENCOUNTER — Emergency Department (HOSPITAL_COMMUNITY)
Admission: EM | Admit: 2019-08-24 | Discharge: 2019-08-24 | Disposition: A | Discharge status: Home / Self Care | Payer: Medicare Other | Attending: Emergency Medicine | Admitting: Emergency Medicine

## 2019-08-24 ENCOUNTER — Encounter (HOSPITAL_COMMUNITY): Payer: Self-pay | Admitting: *Deleted

## 2019-08-24 ENCOUNTER — Emergency Department (HOSPITAL_COMMUNITY): Payer: Medicare Other

## 2019-08-24 DIAGNOSIS — Z87891 Personal history of nicotine dependence: Secondary | ICD-10-CM | POA: Insufficient documentation

## 2019-08-24 DIAGNOSIS — I5032 Chronic diastolic (congestive) heart failure: Secondary | ICD-10-CM | POA: Diagnosis not present

## 2019-08-24 DIAGNOSIS — N183 Chronic kidney disease, stage 3 unspecified: Secondary | ICD-10-CM | POA: Diagnosis not present

## 2019-08-24 DIAGNOSIS — R1030 Lower abdominal pain, unspecified: Secondary | ICD-10-CM

## 2019-08-24 DIAGNOSIS — Z8719 Personal history of other diseases of the digestive system: Secondary | ICD-10-CM | POA: Diagnosis not present

## 2019-08-24 DIAGNOSIS — Z79899 Other long term (current) drug therapy: Secondary | ICD-10-CM | POA: Insufficient documentation

## 2019-08-24 DIAGNOSIS — R110 Nausea: Secondary | ICD-10-CM | POA: Diagnosis not present

## 2019-08-24 DIAGNOSIS — Z85038 Personal history of other malignant neoplasm of large intestine: Secondary | ICD-10-CM | POA: Insufficient documentation

## 2019-08-24 DIAGNOSIS — I130 Hypertensive heart and chronic kidney disease with heart failure and stage 1 through stage 4 chronic kidney disease, or unspecified chronic kidney disease: Secondary | ICD-10-CM | POA: Insufficient documentation

## 2019-08-24 DIAGNOSIS — R1032 Left lower quadrant pain: Secondary | ICD-10-CM | POA: Insufficient documentation

## 2019-08-24 LAB — URINALYSIS, ROUTINE W REFLEX MICROSCOPIC
Bilirubin Urine: NEGATIVE
Glucose, UA: NEGATIVE mg/dL
Hgb urine dipstick: NEGATIVE
Ketones, ur: NEGATIVE mg/dL
Leukocytes,Ua: NEGATIVE
Nitrite: NEGATIVE
Protein, ur: NEGATIVE mg/dL
Specific Gravity, Urine: 1.01 (ref 1.005–1.030)
pH: 5 (ref 5.0–8.0)

## 2019-08-24 LAB — CBC
HCT: 48.1 % (ref 39.0–52.0)
Hemoglobin: 15.9 g/dL (ref 13.0–17.0)
MCH: 30.6 pg (ref 26.0–34.0)
MCHC: 33.1 g/dL (ref 30.0–36.0)
MCV: 92.5 fL (ref 80.0–100.0)
Platelets: 185 10*3/uL (ref 150–400)
RBC: 5.2 MIL/uL (ref 4.22–5.81)
RDW: 13.2 % (ref 11.5–15.5)
WBC: 10.1 10*3/uL (ref 4.0–10.5)
nRBC: 0 % (ref 0.0–0.2)

## 2019-08-24 LAB — COMPREHENSIVE METABOLIC PANEL
ALT: 17 U/L (ref 0–44)
AST: 18 U/L (ref 15–41)
Albumin: 4.1 g/dL (ref 3.5–5.0)
Alkaline Phosphatase: 69 U/L (ref 38–126)
Anion gap: 10 (ref 5–15)
BUN: 17 mg/dL (ref 8–23)
CO2: 24 mmol/L (ref 22–32)
Calcium: 9.2 mg/dL (ref 8.9–10.3)
Chloride: 105 mmol/L (ref 98–111)
Creatinine, Ser: 1.51 mg/dL — ABNORMAL HIGH (ref 0.61–1.24)
GFR calc Af Amer: 54 mL/min — ABNORMAL LOW (ref >60)
GFR calc non Af Amer: 47 mL/min — ABNORMAL LOW (ref >60)
Glucose, Bld: 111 mg/dL — ABNORMAL HIGH (ref 70–99)
Potassium: 4.6 mmol/L (ref 3.5–5.1)
Sodium: 139 mmol/L (ref 135–145)
Total Bilirubin: 0.8 mg/dL (ref 0.3–1.2)
Total Protein: 7 g/dL (ref 6.5–8.1)

## 2019-08-24 LAB — LIPASE, BLOOD: Lipase: 26 U/L (ref 11–51)

## 2019-08-24 MED ORDER — HYDROMORPHONE HCL 1 MG/ML IJ SOLN
0.5000 mg | Freq: Once | INTRAMUSCULAR | Status: AC
  Administered 2019-08-24: 0.5 mg via INTRAVENOUS
  Filled 2019-08-24: qty 1

## 2019-08-24 MED ORDER — DILTIAZEM HCL-DEXTROSE 125-5 MG/125ML-% IV SOLN (PREMIX): 5.0000 mg/h | INTRAVENOUS | Status: DC

## 2019-08-24 MED ORDER — ONDANSETRON 4 MG PO TBDP: ORAL_TABLET | ORAL | 0 refills | Status: DC

## 2019-08-24 MED ORDER — ONDANSETRON HCL 4 MG/2ML IJ SOLN
4.0000 mg | Freq: Once | INTRAMUSCULAR | Status: AC
  Administered 2019-08-24: 4 mg via INTRAVENOUS
  Filled 2019-08-24: qty 2

## 2019-08-24 MED ORDER — HYDROCODONE-ACETAMINOPHEN 5-325 MG PO TABS: 1.0000 | ORAL_TABLET | Freq: Four times a day (QID) | ORAL | 0 refills | Status: DC | PRN

## 2019-08-24 MED ORDER — DILTIAZEM HCL 25 MG/5ML IV SOLN: 10.0000 mg | Freq: Once | INTRAVENOUS | Status: DC

## 2019-08-24 MED ORDER — IOHEXOL 300 MG/ML  SOLN
80.0000 mL | Freq: Once | INTRAMUSCULAR | Status: AC | PRN
  Administered 2019-08-24: 80 mL via INTRAVENOUS

## 2019-08-24 MED ORDER — HEPARIN SOD (PORK) LOCK FLUSH 100 UNIT/ML IV SOLN
500.0000 [IU] | Freq: Once | INTRAVENOUS | Status: DC
  Filled 2019-08-24: qty 5

## 2019-08-24 MED ORDER — HYDROMORPHONE HCL 1 MG/ML IJ SOLN: 0.5000 mg | Freq: Once | INTRAMUSCULAR | Status: DC

## 2019-08-24 MED ORDER — SODIUM CHLORIDE 0.9 % IV BOLUS
500.0000 mL | Freq: Once | INTRAVENOUS | Status: AC
  Administered 2019-08-24: 500 mL via INTRAVENOUS

## 2019-08-24 NOTE — ED Provider Notes (Signed)
[facility] EMERGENCY DEPARTMENT Provider Note   CSN: [id number] Arrival date & time: 08/24/19  1204     History Chief Complaint  Patient presents with  . Abdominal Pain  . Nausea    [name] is a 69 y.o. male.  Patient complains of left lower abdominal pain.  Some nausea.  The history is provided by the patient. No language interpreter was used.  Abdominal Pain Pain location:  Generalized Pain quality: aching   Pain radiates to:  Does not radiate Pain severity:  Mild Onset quality:  Sudden Timing:  Constant Progression:  Waxing and waning Chronicity:  New Context: not alcohol use   Relieved by:  Nothing Associated symptoms: nausea   Associated symptoms: no chest pain, no cough, no diarrhea, no fatigue and no hematuria        Past Medical History:  Diagnosis Date  . 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 Anemia   . Anxiety   . Arthritis   . Barrett's esophagus   . Blood transfusion   . Bowel obstruction ([place]) 05/13/2012   Recurrent  . Bronchitis   . Chest pain at rest   . Chronic abdominal pain   . Erosive esophagitis   . ETOH abuse    quit 03/2010  . GERD (gastroesophageal reflux disease)   . Hx of Clostridium difficile infection 01/2012  . Hypertension   . Ileus ([place])   . Iron deficiency anemia 03/23/2016  . Obstruction of bowel ([place]) 03/03/14  . Osteoporosis   . Personal history of PE (pulmonary embolism) 10/01/2010  . Pneumonia   . Pulmonary embolism ([place]) 02/2010  . Recurrent upper respiratory infection (URI)   . Renal disorder   . S/P endoscopy September 28, 2010   erosive reflux esophagitis, Billroth I anatomy  . 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 Seizures ([place])   . Shortness of breath   . TIA (transient ischemic attack) 10/11  . Vitamin B12 deficiency     Patient Active Problem List   Diagnosis Date Noted  . Pancreatic insufficiency 05/03/2019  . Dyslipidemia 05/03/2019  . B12 deficiency 05/03/2019  .  Anxiety 05/03/2019  . Early satiety 04/25/2019  . Seizure disorder ([place]) 09/16/2016  . CKD (chronic kidney disease) stage 3, GFR 30-59 ml/min 06/14/2016  . Patient has nasogastric tube   . Ileus ([place]) 05/15/2016  . Chronic diastolic heart failure ([place]) 05/15/2016  . Iron deficiency anemia 03/23/2016  . Acute renal failure ([place]) 03/19/2016  . Normocytic anemia 03/19/2016  . Hypotension 03/19/2016  . Anemia   . History of colon cancer, stage III   . Hx of colon cancer, stage III   . History of colonic polyps   . Barrett's esophagus without dysplasia   . Dysphagia   . Essential hypertension 11/30/2014  . Dysphagia, pharyngoesophageal phase   . Mucosal abnormality of esophagus   . SBO (small bowel obstruction) ([place]) 09/17/2013  . Rectal bleeding 07/11/2012  . Bowel obstruction ([place]) 07/11/2012  . Abnormal CT scan, colon 07/11/2012  . Esophageal dysphagia 02/22/2012  . H/O Clostridium difficile infection 02/22/2012  . Diarrhea 09/10/2011  . Cellulitis 09/10/2011  . Nausea 06/07/2011  . Abdominal distention 06/07/2011  . HTN (hypertension) 06/07/2011  . Partial small bowel obstruction ([place]) 05/31/2011  . GI bleed 05/27/2011  . Coagulopathy ([place]) 05/27/2011  . Gastroenteritis 05/27/2011  . Small bowel obstruction ([place]) 05/13/2011  . LUQ pain 05/13/2011  . Pulmonary embolism ([place]) 01/17/2011  . Chest  pain at rest 01/17/2011  . Pneumonia 12/11/2010  . Hematemesis 12/05/2010  . Chronic abdominal pain 12/05/2010  . Pancytopenia due to antineoplastic chemotherapy ([place]) 12/05/2010  . Coffee ground emesis 11/29/2010  . Esophageal reflux disease 11/15/2010  . 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 Personal history of PE (pulmonary embolism) 10/01/2010  . Bronchitis, acute 10/01/2010  . Erosive esophagitis 10/01/2010  . Fever chills 09/29/2010  . Bleeding gastrointestinal 09/26/2010  . Supratherapeutic INR 09/26/2010  . Abdominal pain 09/26/2010  . Nausea and vomiting 09/26/2010    . Anemia, chronic disease 09/26/2010  . Adenocarcinoma of colon with mucinous features 09/22/2010    Past Surgical History:  Procedure Laterality Date  . ABDOMINAL ADHESION SURGERY  03/04/15   @ [facility]  . ABDOMINAL EXPLORATION SURGERY    . abdominal sugery     for bowel obstruction x 8, all in 1980s, except for one in 07/2010  . APPENDECTOMY  1980s  . Billroth 1 hemigastrectomy  1980s   per patient for benign duodenal tumor  . CARDIAC CATHETERIZATION  07/17/2012  . CHOLECYSTECTOMY  1980s  . COLON SURGERY  May 2012   left hemicolectomy, colon cancer found at time of surgery for bowel obstruction  . COLON SURGERY  10/12/2018   At [facility]: Resection of right colon and terminal ileum with creation of ileorectal anastomosis for large bowel obstruction.  Cecum and ascending colon noted to be attached to the rectum.  . COLONOSCOPY  03/18/2011   anastomosis at 35cm. Several adenomatous polyps removed. Sigmoid diverticulosis. Next TCS 02/2013  . COLONOSCOPY N/A 07/24/2012   GHW:EXHBZJ post segmental resection with normal-appearing colonic anastomosis aside from an adjacent polyp-removed as described above. Rectal polyp-removed as described above. CT findings appear to have been artifactual. tubular adenomas/prolapsed type polyp.  . COLONOSCOPY N/A 05/15/2015   Procedure: COLONOSCOPY;  Surgeon: [name], MD;  Location: [facility] ENDO SUITE;  Service: Endoscopy;  Laterality: N/A;  . COLONOSCOPY  09/26/2018   at [facility]: prep not adequate for colon cancer surveillance.  Prior end-to-end colonic anastomosis in the rectosigmoid region.  This was patent and characterized by healthy-appearing mucosa.  Anastomosis was traversed.  Normal terminal ileum.  . COLONOSCOPY WITH PROPOFOL N/A 11/04/2016   Dr. [name], status post subtotal colectomy with normal-appearing residual lower GI tract.  Next colonoscopy in 5 years.  . ESOPHAGOGASTRODUODENOSCOPY  09/28/2010  . ESOPHAGOGASTRODUODENOSCOPY  12/01/2010   Cervical web  status post dilation, erosive esophagitis, B1 hemigastrectomy, inflamed anastomosis  . ESOPHAGOGASTRODUODENOSCOPY  04/16/2011   excoriation at GEJ c/w trauma/M-W tear, friable gastric anastomosis, dilation efferent limb  . ESOPHAGOGASTRODUODENOSCOPY N/A 06/03/2014   Dr.[name]- cervcal esopphageal web s/p dilation. abnormal distal esophagus bx= barretts esophagus  . ESOPHAGOGASTRODUODENOSCOPY N/A 05/15/2015   Procedure: ESOPHAGOGASTRODUODENOSCOPY (EGD);  Surgeon: [name], MD;  Location: [facility] SUITE;  Service: Endoscopy;  Laterality: N/A;  230  . ESOPHAGOGASTRODUODENOSCOPY (EGD) WITH ESOPHAGEAL DILATION  02/25/2012   IRC:VELFYBOF esophageal web-s/p dilation anddisruption as described above. Status post prior gastric with Billroth I configuration. Abnormal gastric mucosa at the anastomosis. Gastric biopsy showed mild chronic inflammation but no H. pylori   . ESOPHAGOGASTRODUODENOSCOPY (EGD) WITH PROPOFOL N/A 11/04/2016   Dr. [name]: Reflux esophagitis, small hiatal hernia status post hemigastrectomy.  Single patent efferent small bowel limb appeared normal, 2 x 2 centimeter tongue of salmon epithelium again seen, esophageal dilation.  Biopsies consistent with reflux changes, not Barrett's however this was confirmed on prior EGDs.  Offer 3-year follow-up EGD August 2021.  [name] HERNIA  REPAIR     right inguinal  . MALONEY DILATION N/A 06/03/2014   Procedure: [name] DILATION;  Surgeon: [name], MD;  Location: [facility];  Service: Endoscopy;  Laterality: N/A;  [name] DILATION N/A 05/15/2015   Procedure: [name] DILATION;  Surgeon: [name], MD;  Location: [facility];  Service: Endoscopy;  Laterality: N/A;  . [name] DILATION N/A 11/04/2016   Procedure: [name] DILATION;  Surgeon: [name], MD;  Location: [facility];  Service: Endoscopy;  Laterality: N/A;  . PORTACATH PLACEMENT    . SAVORY DILATION N/A 06/03/2014   Procedure: SAVORY DILATION;  Surgeon: [name], MD;   Location: [facility];  Service: Endoscopy;  Laterality: N/A;       Family History  Problem Relation Age of Onset  . Hypertension Mother   . Arthritis Mother   . Pneumonia Mother   . Hypertension Father   . Heart attack Father   . Colon cancer Neg Hx     Social History   Tobacco Use  . Smoking status: Former Smoker    Packs/day: 0.50    Years: 40.00    Pack years: 20.00    Types: Cigarettes    Quit date: 12/20/2012    Years since quitting: 6.6  . Smokeless tobacco: Never Used  Substance Use Topics  . Alcohol use: No  . Drug use: No    Home Medications Prior to Admission medications   Medication Sig Start Date End Date Taking? Authorizing Provider  ammonium lactate (AMLACTIN) 12 % cream See admin instructions. APPLY CREAM TO DRY SKIN ON LEGS ARMS HANDS AND BACK AREA ONCE TO TWICE DAILY (AVOID FACE GROIN UNDERARMS) 11/22/17  Yes [provider]  atorvastatin (LIPITOR) 20 MG tablet Take 20 mg by mouth at bedtime.    Yes [provider]  carvedilol (COREG) 25 MG tablet Take 0.5 tablets (12.5 mg total) by mouth 2 (two) times daily with a meal. Patient taking differently: Take 25 mg by mouth 2 (two) times daily with a meal.  05/06/19  Yes Emokpae, Courage, MD  Cholecalciferol (VITAMIN D) 2000 units CAPS Take 2,000 Units by mouth daily.   Yes [provider]  Cholestyramine POWD 2 grams orally daily. Don't take within 2 hours before or after other medications 07/17/19  Yes [name], Cristopher Estimable, MD  gabapentin (NEURONTIN) 100 MG capsule Take 1 capsule (100 mg total) by mouth at bedtime. 06/29/19  Yes Lockamy, Randi L, NP-C  lipase/protease/amylase (CREON) 36000 UNITS CPEP capsule Take 2 capsules with meals and 1 capsule with snacks. Patient taking differently: Take 3 capsules with meals and 2 capsule with snacks. 04/25/19  Yes Mahala Menghini, PA-C  loperamide (IMODIUM) 2 MG capsule Take 2-4 mg by mouth as needed.  11/20/18  Yes [provider]  mirtazapine  (REMERON) 30 MG tablet Take 1 tablet (30 mg total) by mouth at bedtime. 08/13/19  Yes Derek Jack, MD  Multiple Vitamin (MULTIVITAMIN WITH MINERALS) TABS tablet Take 1 tablet by mouth daily.   Yes [provider]  Nutritional Supplements (ENSURE PLUS HN) LIQD Take 1 Bottle daily by mouth.    Yes [provider]  pantoprazole (PROTONIX) 40 MG tablet Take 1 tablet (40 mg total) by mouth 2 (two) times daily before a meal. 04/25/19  Yes Mahala Menghini, PA-C  traMADol (ULTRAM) 50 MG tablet Take 50 mg by mouth daily as needed for moderate pain.  04/14/18  Yes [provider]  zoledronic acid (RECLAST) 5 MG/100ML SOLN injection Inject 5 mg into the vein See admin instructions. Pt gets a dose yearly. Due again 2018   Yes [provider]  acetaminophen (TYLENOL) 325 MG tablet TAKE 3 TABLETS BY MOUTH EVERY 6 HOURS AS NEEDED FOR UP TO 10 DAYS 10/28/18   [provider]  enoxaparin (LOVENOX) 60 MG/0.6ML injection Inject 0.6 mLs (60 mg total) into the skin daily. Patient not taking: Reported on 07/17/2019 05/06/19   Roxan Hockey, MD  HYDROcodone-acetaminophen (NORCO/VICODIN) 5-325 MG tablet Take 1 tablet by mouth every 6 (six) hours as needed. 08/24/19   Milton Ferguson, MD  ondansetron (ZOFRAN ODT) 4 MG disintegrating tablet 4mg  ODT q4 hours prn nausea/vomit 08/24/19   Milton Ferguson, MD  polyethylene glycol (MIRALAX / GLYCOLAX) 17 g packet Take 17 g by mouth daily. Patient not taking: Reported on 07/17/2019 05/06/19   Roxan Hockey, MD  sucralfate (CARAFATE) 1 GM/10ML suspension Take 10 mLs (1 g total) by mouth 4 (four) times daily as needed. For breakthrough heartburn 04/25/19   Mahala Menghini, PA-C  gabapentin (NEURONTIN) 100 MG capsule Take 1 capsule by mouth at bedtime 12/19/18   Lockamy, Randi L, NP-C    Allergies    Patient has no known allergies.  Review of Systems   Review of Systems  Constitutional: Negative for appetite change and fatigue.  HENT:  Negative for congestion, ear discharge and sinus pressure.   Eyes: Negative for discharge.  Respiratory: Negative for cough.   Cardiovascular: Negative for chest pain.  Gastrointestinal: Positive for abdominal pain and nausea. Negative for diarrhea.  Genitourinary: Negative for frequency and hematuria.  Musculoskeletal: Negative for back pain.  Skin: Negative for rash.  Neurological: Negative for seizures and headaches.  Psychiatric/Behavioral: Negative for hallucinations.    Physical Exam Updated Vital Signs BP (!) 142/76   Pulse (!) 58   Temp 98.1 F (36.7 C) (Oral)   Resp 18   Ht 5\' 8"  (1.727 m)   Wt 74.4 kg   SpO2 100%   BMI 24.94 kg/m   Physical Exam Vitals and nursing note reviewed.  Constitutional:      Appearance: He is well-developed.  HENT:     Head: Normocephalic.     Nose: Nose normal.  Eyes:     General: No scleral icterus.    Conjunctiva/sclera: Conjunctivae normal.  Neck:     Thyroid: No thyromegaly.  Cardiovascular:     Rate and Rhythm: Normal rate and regular rhythm.     Heart sounds: No murmur. No friction rub. No gallop.   Pulmonary:     Breath sounds: No stridor. No wheezing or rales.  Chest:     Chest wall: No tenderness.  Abdominal:     General: There is no distension.     Tenderness: There is abdominal tenderness. There is no rebound.  Musculoskeletal:        General: Normal range of motion.     Cervical back: Neck supple.  Lymphadenopathy:     Cervical: No cervical adenopathy.  Skin:    Findings: No erythema or rash.  Neurological:     Mental Status: He is alert and oriented to person, place, and time.     Motor: No abnormal muscle tone.     Coordination: Coordination normal.  Psychiatric:        Behavior: Behavior normal.     ED Results / Procedures / Treatments   Labs (all labs ordered are listed, but only abnormal results are  displayed) Labs Reviewed  COMPREHENSIVE METABOLIC PANEL - Abnormal; Notable for the following  components:      Result Value   Glucose, Bld 111 (*)    Creatinine, Ser 1.51 (*)    GFR calc non Af Amer 47 (*)    GFR calc Af Amer 54 (*)    All other components within normal limits  LIPASE, BLOOD  CBC  URINALYSIS, ROUTINE W REFLEX MICROSCOPIC    EKG EKG Interpretation  Date/Time:  Friday August 24 2019 13:09:01 EDT Ventricular Rate:  73 PR Interval:  132 QRS Duration: 82 QT Interval:  360 QTC Calculation: 396 R Axis:   -5 Text Interpretation: Normal sinus rhythm Nonspecific T wave abnormality Confirmed by Lajean Saver [phone]) on 08/24/2019 1:53:58 PM   Radiology CT ABDOMEN PELVIS W CONTRAST  Result Date: 08/24/2019 CLINICAL DATA:  Left-sided abdominal pain. EXAM: CT ABDOMEN AND PELVIS WITH CONTRAST TECHNIQUE: Multidetector CT imaging of the abdomen and pelvis was performed using the standard protocol following bolus administration of intravenous contrast. CONTRAST:  35mL OMNIPAQUE IOHEXOL 300 MG/ML  SOLN COMPARISON:  May 02, 2019 FINDINGS: Lower chest: Mild, stable linear scarring and/or atelectasis is seen within the right lung base. Hepatobiliary: A stable 7 mm well-defined focus of parenchymal low attenuation is seen within the anterior aspect of the right lobe of the liver. Status post cholecystectomy. The common bile duct is dilated (1.1 cm) and tortuous (distally). Pancreas: Unremarkable. No pancreatic ductal dilatation or surrounding inflammatory changes. Spleen: Normal in size without focal abnormality. Adrenals/Urinary Tract: Adrenal glands are unremarkable. Kidneys are normal in size, without renal calculi or hydronephrosis. Stable 2.1 cm and 6.1 cm simple cysts are seen within the right kidney. Bladder is unremarkable. Stomach/Bowel: Surgical sutures are seen within the gastric region, with surgically anastomosed bowel seen within the mid left abdomen and along the expected portion of the mid to distal sigmoid colon. The appendix is not identified. No evidence of bowel  dilatation. Vascular/Lymphatic: There is moderate severity aortic calcification and atherosclerosis. No enlarged abdominal or pelvic lymph nodes. Reproductive: The prostate gland is moderately enlarged an heterogeneous in appearance. Other: No abdominal wall hernia or abnormality. No abdominopelvic ascites. Musculoskeletal: No acute or significant osseous findings. IMPRESSION: 1. Status post cholecystectomy. 2. Stable simple cysts within the right kidney. 3. Stable postoperative changes within the gastric region. 4. Moderate severity aortic calcification and atherosclerosis. 5. Enlarged, heterogeneous prostate gland. Correlation with PSA values is recommended. Aortic Atherosclerosis (ICD10-I70.0). Electronically Signed   By: Virgina Norfolk M.D.   On: 08/24/2019 17:11    Procedures Procedures (including critical care time)  Medications Ordered in ED Medications  HYDROmorphone (DILAUDID) injection 0.5 mg (has no administration in time range)  HYDROmorphone (DILAUDID) injection 0.5 mg (0.5 mg Intravenous Given 08/24/19 1553)  ondansetron (ZOFRAN) injection 4 mg (4 mg Intravenous Given 08/24/19 1552)  sodium chloride 0.9 % bolus 500 mL (0 mLs Intravenous Stopped 08/24/19 1611)  iohexol (OMNIPAQUE) 300 MG/ML solution 80 mL (80 mLs Intravenous Contrast Given 08/24/19 1624)    ED Course  I have reviewed the triage vital signs and the nursing notes.  Pertinent labs & imaging results that were available during my care of the patient were reviewed by me and considered in my medical decision making (see chart for details).    MDM Rules/Calculators/A&P                      Labs and CT scan unremarkable.  Patient given pain medicine nausea  medicine to take at home and he is instructed to follow-up with his gastroenterologist next week       This patient presents to the ED for concern of abdominal pain this involves an extensive number of treatment options, and is a complaint that carries with it a high  risk of complications and morbidity.  The differential diagnosis includes small bowel obstruction gastroenteritis   Lab Tests:   I Ordered, reviewed, and interpreted labs, which included CBC chemistries which shows mild  Medicines ordered:   I ordered medication Dilaudid for pain  Imaging Studies ordered:   I ordered imaging studies which included CT abdomen and  I independently visualized and interpreted imaging which showed no acute disease  Additional history obtained:   Additional history obtained from records  Previous records obtained and reviewed   Consultations Obtained:   Reevaluation:  After the interventions stated above, I reevaluated the patient and found improved  Critical Interventions:  .   Final Clinical Impression(s) / ED Diagnoses Final diagnoses:  Lower abdominal pain    Rx / DC Orders ED Discharge Orders         Ordered    ondansetron (ZOFRAN ODT) 4 MG disintegrating tablet     08/24/19 1826    HYDROcodone-acetaminophen (NORCO/VICODIN) 5-325 MG tablet  Every 6 hours PRN     08/24/19 1826           Milton Ferguson, MD 08/27/19 1032

## 2019-08-24 NOTE — ED Notes (Signed)
Pt reports 16 GI surgeries   abd pain w N since 0500  Here for eval

## 2019-08-24 NOTE — ED Triage Notes (Signed)
Abdominal pain with nausea onset 0500

## 2019-08-24 NOTE — Discharge Instructions (Addendum)
Follow-up with Dr.rourke next week

## 2019-08-29 ENCOUNTER — Encounter (HOSPITAL_COMMUNITY): Payer: Medicare Other

## 2019-09-12 ENCOUNTER — Ambulatory Visit (INDEPENDENT_AMBULATORY_CARE_PROVIDER_SITE_OTHER): Payer: Medicare Other | Admitting: Gastroenterology

## 2019-09-12 ENCOUNTER — Other Ambulatory Visit: Payer: Self-pay

## 2019-09-12 ENCOUNTER — Encounter: Payer: Self-pay | Admitting: Gastroenterology

## 2019-09-12 VITALS — BP 149/84 | HR 65 | Temp 97.0°F | Ht 69.0 in | Wt 148.2 lb

## 2019-09-12 DIAGNOSIS — R197 Diarrhea, unspecified: Secondary | ICD-10-CM | POA: Diagnosis not present

## 2019-09-12 DIAGNOSIS — K8689 Other specified diseases of pancreas: Secondary | ICD-10-CM | POA: Diagnosis not present

## 2019-09-12 DIAGNOSIS — K2270 Barrett's esophagus without dysplasia: Secondary | ICD-10-CM

## 2019-09-12 DIAGNOSIS — R634 Abnormal weight loss: Secondary | ICD-10-CM

## 2019-09-12 DIAGNOSIS — R1012 Left upper quadrant pain: Secondary | ICD-10-CM

## 2019-09-12 NOTE — Progress Notes (Signed)
Primary Care Physician:  [name], MD  Primary Gastroenterologist:  Garfield Cornea, MD   Chief Complaint  Patient presents with  . Diarrhea    daily 5-9 times per day, no blood in stool  . Nausea    no vomiting  . Gastroesophageal Reflux    f/u doing okay  . Dysphagia    no issues    HPI:  [name] is a 69 y.o. male here for follow-up.  Last seen April 2021 with history of chronic diarrhea, weight loss.  History of colon cancer status post multiple surgeries.  Most recent surgery left him with ileorectal anastomosis.  Colon cancer felt to be in remission followed by oncology.  History of pancreatic exocrine insufficiency.  History of multiple small bowel obstructions.  Chronic GERD with history of short segment Barrett's esophagus on on pantoprazole twice daily.  Due for surveillance colonoscopy (will be a limited sigmoidoscopy) and EGD for history of short segment Barrett's 2023.  Cholestyramine 2 g daily started at last office visit.  He presents with 57-month stool diary consistently having anywhere from 5-9 bowel movements daily.  CT abdomen pelvis with contrast August 24, 2019 for abdominal pain: postoperative changes in the gastric region stable, moderate severity aortic calcification and atherosclerosis, enlarged heterogeneous prostate gland, aortic atherosclerosis, stable 7 mm well-defined focus of parenchymal low-attenuation seen in the anterior aspect of the right lobe of the liver, status post cholecystectomy, common bile duct dilated at 1.1 cm and tortuous distally.  Today patient reports that he may be only slightly better since adding cholestyramine, now some flakes of stool mixed in water. All diarrhea since surgery last year.No nocturnal stools. Stools start every day with first meal. May be while eating or right afterward. Imodium 4mg  before eating. Six total per day. Creon with 3 with meals and 2 with snacks .  Takes the cholestyramine at 10 AM.  Occasional  abdominal pain, bloating, gas.   155 normal weight and now 148. Look at when diarrhea started.  Weight loss.    Pertinent GI history: Previous work-up included negative celiac screen, negative stool studies.  He has a history of osteoporosis and B12 deficiency.  Status post cholecystectomy, hemigastrectomy in the [employer] for benign tumor, numerous small bowel resections for abdominal adhesions and bowel obstruction, and subtotal colectomy for colon cancer. Total of 16 abdominal surgeries per patient. EGD and colonoscopy in 2018 as outlined below.  Stool pancreatic elastase came back low at 28. He was started on Creon.  EGD in August 2018 with reflux esophagitis, small hiatal hernia status post hemigastrectomy. Single patent efferent small bowel ileum appeared normal, 2 x 2 centimeter tongue of salmon epithelium again seen, esophageal dilation.  Biopsies consistent with reflux changes, not Barrett's however this was confirmed on prior EGDs, patient will be offered EGD in August 2021.  Colonoscopy August 2018 status post subtotal colectomy with normal-appearing residual lower GI tract.  Next colonoscopy in 5 years.  2 recent admissions and [facility] back in July 2020.  First 1 for suspected small bowel obstruction. He had a colonoscopy July 7 showing prior end-to-end colocolonic anastomosis in the rectosigmoid colon.  This was patent and was characterized by healthy-appearing mucosa.  The anastomosis was traversed.  The terminal ileum appeared normal.  Prep was inadequate to qualify for colorectal cancer surveillance.  Small bowel obstruction treated conservatively and patient was discharged.    Patient underwent resection of the right colon and terminal ileum with creation of ileal rectal anastomosis  on October 12, 2018 for large bowel obstruction.  Work-up at admission revealed patent colorectal anastomosis with short segment of cecum and ascending colon attached to the rectum. It was felt that he  had a dysfunctional cecum causing incomplete emptying and was massively distended on CT.    Current Outpatient Medications  Medication Sig Dispense Refill  . ammonium lactate (AMLACTIN) 12 % cream See admin instructions. APPLY CREAM TO DRY SKIN ON LEGS ARMS HANDS AND BACK AREA ONCE TO TWICE DAILY (AVOID FACE GROIN UNDERARMS)  3  . atorvastatin (LIPITOR) 20 MG tablet Take 20 mg by mouth at bedtime.     . carvedilol (COREG) 25 MG tablet Take 0.5 tablets (12.5 mg total) by mouth 2 (two) times daily with a meal. (Patient taking differently: Take 25 mg by mouth 2 (two) times daily with a meal. ) 15 tablet 2  . Cholecalciferol (VITAMIN D) 2000 units CAPS Take 2,000 Units by mouth daily.    . Cholestyramine POWD 2 grams orally daily. Don't take within 2 hours before or after other medications 2 g 2  . gabapentin (NEURONTIN) 100 MG capsule Take 1 capsule (100 mg total) by mouth at bedtime. 30 capsule 2  . lipase/protease/amylase (CREON) 36000 UNITS CPEP capsule  (Patient taking differently: Take 3 capsules with meals and 2 capsule with snacks.) 900 capsule 3  . loperamide (IMODIUM) 2 MG capsule Take 2-4 mg by mouth as needed. Usually 2 with meals and six total daily     . mirtazapine (REMERON) 30 MG tablet Take 1 tablet (30 mg total) by mouth at bedtime. 30 tablet 6  . Multiple Vitamin (MULTIVITAMIN WITH MINERALS) TABS tablet Take 1 tablet by mouth daily.    . Nutritional Supplements (ENSURE PLUS HN) LIQD Take 1 Bottle daily by mouth.     . ondansetron (ZOFRAN ODT) 4 MG disintegrating tablet 4mg  ODT q4 hours prn nausea/vomit 12 tablet 0  . pantoprazole (PROTONIX) 40 MG tablet Take 1 tablet (40 mg total) by mouth 2 (two) times daily before a meal. 180 tablet 3  . sucralfate (CARAFATE) 1 GM/10ML suspension Take 10 mLs (1 g total) by mouth 4 (four) times daily as needed. For breakthrough heartburn 420 mL 1  . traMADol (ULTRAM) 50 MG tablet Take 50 mg by mouth daily as needed for moderate pain.     [name]  zoledronic acid (RECLAST) 5 MG/100ML SOLN injection Inject 5 mg into the vein See admin instructions. Pt gets a dose yearly. Due again 2018     No current facility-administered medications for this visit.   Facility-Administered Medications Ordered in Other Visits  Medication Dose Route Frequency Provider Last Rate Last Admin  . heparin lock flush 100 unit/mL  500 Units Intravenous Once Kefalas, Thomas S, PA-C      . sodium chloride flush (NS) 0.9 % injection 10 mL  10 mL Intravenous PRN Holley Bouche, NP   10 mL at 11/05/16 1136    Allergies as of 09/12/2019  . (No Known Allergies)    Past Medical History:  Diagnosis Date  . 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 Anemia   . Anxiety   . Arthritis   . Barrett's esophagus   . Blood transfusion   . Bowel obstruction ([place]) 05/13/2012   Recurrent  . Bronchitis   . Chest pain at rest   . Chronic abdominal pain   . Erosive esophagitis   . ETOH abuse    quit 03/2010  .  GERD (gastroesophageal reflux disease)   . Hx of Clostridium difficile infection 01/2012  . Hypertension   . Ileus ([place])   . Iron deficiency anemia 03/23/2016  . Obstruction of bowel ([place]) 03/03/14  . Osteoporosis   . Personal history of PE (pulmonary embolism) 10/01/2010  . Pneumonia   . Pulmonary embolism ([place]) 02/2010  . Recurrent upper respiratory infection (URI)   . Renal disorder   . S/P endoscopy September 28, 2010   erosive reflux esophagitis, Billroth I anatomy  . 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 Seizures ([place])   . Shortness of breath   . TIA (transient ischemic attack) 10/11  . Vitamin B12 deficiency     Past Surgical History:  Procedure Laterality Date  . ABDOMINAL ADHESION SURGERY  03/04/15   @ [facility]  . ABDOMINAL EXPLORATION SURGERY    . abdominal sugery     for bowel obstruction x 8, all in 1980s, except for one in 07/2010  . APPENDECTOMY  1980s  . Billroth 1 hemigastrectomy  1980s   per patient for benign duodenal  tumor  . CARDIAC CATHETERIZATION  07/17/2012  . CHOLECYSTECTOMY  1980s  . COLON SURGERY  May 2012   left hemicolectomy, colon cancer found at time of surgery for bowel obstruction  . COLON SURGERY  10/12/2018   At [facility]: Resection of right colon and terminal ileum with creation of ileorectal anastomosis for large bowel obstruction.  Cecum and ascending colon noted to be attached to the rectum.  . COLONOSCOPY  03/18/2011   anastomosis at 35cm. Several adenomatous polyps removed. Sigmoid diverticulosis. Next TCS 02/2013  . COLONOSCOPY N/A 07/24/2012   JME:QASTMH post segmental resection with normal-appearing colonic anastomosis aside from an adjacent polyp-removed as described above. Rectal polyp-removed as described above. CT findings appear to have been artifactual. tubular adenomas/prolapsed type polyp.  . COLONOSCOPY N/A 05/15/2015   Procedure: COLONOSCOPY;  Surgeon: [name], MD;  [facility]: [facility];  Service: Endoscopy;  Laterality: N/A;  . COLONOSCOPY  09/26/2018   at [facility]: prep not adequate for colon cancer surveillance.  Prior end-to-end colonic anastomosis in the rectosigmoid region.  This was patent and characterized by healthy-appearing mucosa.  Anastomosis was traversed.  Normal terminal ileum.  . COLONOSCOPY WITH PROPOFOL N/A 11/04/2016   Dr. [name], status post subtotal colectomy with normal-appearing residual lower GI tract.  Next colonoscopy in 5 years.  . ESOPHAGOGASTRODUODENOSCOPY  09/28/2010  . ESOPHAGOGASTRODUODENOSCOPY  12/01/2010   Cervical web status post dilation, erosive esophagitis, B1 hemigastrectomy, inflamed anastomosis  . ESOPHAGOGASTRODUODENOSCOPY  04/16/2011   excoriation at [facility] c/w trauma/M-W tear, friable gastric anastomosis, dilation efferent limb  . ESOPHAGOGASTRODUODENOSCOPY N/A 06/03/2014   Dr.[name]- cervcal esopphageal web s/p dilation. abnormal distal esophagus bx= barretts esophagus  . ESOPHAGOGASTRODUODENOSCOPY N/A 05/15/2015   Procedure:  ESOPHAGOGASTRODUODENOSCOPY (EGD);  Surgeon: [name], MD;  [facility]: [facility];  Service: Endoscopy;  Laterality: N/A;  230  . ESOPHAGOGASTRODUODENOSCOPY (EGD) WITH ESOPHAGEAL DILATION  02/25/2012   DQQ:[id number] esophageal web-s/p dilation anddisruption as described above. Status post prior gastric with Billroth I configuration. Abnormal gastric mucosa at the anastomosis. Gastric biopsy showed mild chronic inflammation but no H. pylori   . ESOPHAGOGASTRODUODENOSCOPY (EGD) WITH PROPOFOL N/A 11/04/2016   Dr. [name]: Reflux esophagitis, small hiatal hernia status post hemigastrectomy.  Single patent efferent small bowel limb appeared normal, 2 x 2 centimeter tongue of salmon epithelium again seen, esophageal dilation.  Biopsies consistent with reflux changes, not Barrett's however this was confirmed on prior EGDs.  Offer 3-year follow-up EGD August 2021.  [name] HERNIA REPAIR     right inguinal  . MALONEY DILATION N/A 06/03/2014   Procedure: [name] DILATION;  Surgeon: [name], MD;  [facility];  Service: Endoscopy;  Laterality: N/A;  [name] DILATION N/A 05/15/2015   Procedure: [name] DILATION;  Surgeon: [name], MD;  [facility];  Service: Endoscopy;  Laterality: N/A;  . [name] DILATION N/A 11/04/2016   Procedure: [name] DILATION;  Surgeon: [name], MD;  [facility];  Service: Endoscopy;  Laterality: N/A;  . PORTACATH PLACEMENT    . SAVORY DILATION N/A 06/03/2014   Procedure: SAVORY DILATION;  Surgeon: [name], MD;  [facility];  Service: Endoscopy;  Laterality: N/A;    Family History  Problem Relation Age of Onset  . Hypertension Mother   . Arthritis Mother   . Pneumonia Mother   . Hypertension Father   . Heart attack Father   . Colon cancer Neg Hx     Social History   Socioeconomic History  . Marital status: Married    Spouse name: Not on file  . Number of children: 3  . Years of education: Not on file   . Highest education level: Not on file  Occupational History    Employer: [employer]  Tobacco Use  . Smoking status: Former Smoker    Packs/day: 0.50    Years: 40.00    Pack years: 20.00    Types: Cigarettes    Quit date: 12/20/2012    Years since quitting: 6.7  . Smokeless tobacco: Never Used  Vaping Use  . Vaping Use: Never used  Substance and Sexual Activity  . Alcohol use: No  . Drug use: No  . Sexual activity: Never  Other Topics Concern  . Not on file  Social History Narrative  . Not on file   Social Determinants of Health   Financial Resource Strain:   . Difficulty of Paying Living Expenses:   Food Insecurity:   . Worried About Charity fundraiser in the Last Year:   . Arboriculturist in the Last Year:   Transportation Needs:   . Film/video editor (Medical):   [name] Lack of Transportation (Non-Medical):   Physical Activity:   . Days of Exercise per Week:   . Minutes of Exercise per Session:   Stress:   . Feeling of Stress :   Social Connections:   . Frequency of Communication with Friends and Family:   . Frequency of Social Gatherings with Friends and Family:   . Attends Religious Services:   . Active Member of Clubs or Organizations:   . Attends Archivist Meetings:   [name] Marital Status:   Intimate Partner Violence:   . Fear of Current or Ex-Partner:   . Emotionally Abused:   [name] Physically Abused:   . Sexually Abused:       ROS:  General: Negative for anorexia, weight loss, fever, chills, fatigue, weakness. Eyes: Negative for vision changes.  ENT: Negative for hoarseness, difficulty swallowing , nasal congestion. CV: Negative for chest pain, angina, palpitations, dyspnea on exertion, peripheral edema.  Respiratory: Negative for dyspnea at rest, dyspnea on exertion, cough, sputum, wheezing.  GI: See history of present illness. GU:  Negative for dysuria, hematuria, urinary incontinence, urinary frequency, nocturnal urination.  MS:  Negative for joint pain, low back pain.  Derm: Negative for rash or itching.  Neuro: Negative for weakness, abnormal sensation, seizure, frequent headaches, memory loss, confusion.  Psych: Negative for anxiety, depression, suicidal ideation, hallucinations.  Endo: Negative for unusual weight change.  Heme: Negative for bruising or bleeding. Allergy: Negative for rash or hives.    Physical Examination:  BP (!) 149/84   Pulse 65   Temp (!) 97 F (36.1 C)   Ht 5\' 9"  (1.753 m)   Wt 148 lb 3.2 oz (67.2 kg)   BMI 21.89 kg/m    General: Well-nourished, well-developed in no acute distress.  Head: Normocephalic, atraumatic.   Eyes: Conjunctiva pink, no icterus. Mouth: Oropharyngeal mucosa moist and pink , no lesions erythema or exudate. Neck: Supple without thyromegaly, masses, or lymphadenopathy.  Lungs: Clear to auscultation bilaterally.  Heart: Regular rate and rhythm, no murmurs rubs or gallops.  Abdomen: Bowel sounds are normal, nontender, nondistended, no hepatosplenomegaly or masses, no abdominal bruits or    hernia , no rebound or guarding.   Rectal: not performed Extremities: No lower extremity edema. No clubbing or deformities.  Neuro: Alert and oriented x 4 , grossly normal neurologically.  Skin: Warm and dry, no rash or jaundice.   Psych: Alert and cooperative, normal mood and affect.  Labs: Lab Results  Component Value Date   CREATININE 1.51 (H) 08/24/2019   BUN 17 08/24/2019   NA 139 08/24/2019   K 4.6 08/24/2019   CL 105 08/24/2019   CO2 24 08/24/2019   Lab Results  Component Value Date   WBC 10.1 08/24/2019   HGB 15.9 08/24/2019   HCT 48.1 08/24/2019   MCV 92.5 08/24/2019   PLT 185 08/24/2019   Lab Results  Component Value Date   ALT 17 08/24/2019   AST 18 08/24/2019   ALKPHOS 69 08/24/2019   BILITOT 0.8 08/24/2019   Lab Results  Component Value Date   LIPASE 26 08/24/2019     Imaging Studies: CT ABDOMEN PELVIS W CONTRAST  Result Date:  08/24/2019 CLINICAL DATA:  Left-sided abdominal pain. EXAM: CT ABDOMEN AND PELVIS WITH CONTRAST TECHNIQUE: Multidetector CT imaging of the abdomen and pelvis was performed using the standard protocol following bolus administration of intravenous contrast. CONTRAST:  25mL OMNIPAQUE IOHEXOL 300 MG/ML  SOLN COMPARISON:  May 02, 2019 FINDINGS: Lower chest: Mild, stable linear scarring and/or atelectasis is seen within the right lung base. Hepatobiliary: A stable 7 mm well-defined focus of parenchymal low attenuation is seen within the anterior aspect of the right lobe of the liver. Status post cholecystectomy. The common bile duct is dilated (1.1 cm) and tortuous (distally). Pancreas: Unremarkable. No pancreatic ductal dilatation or surrounding inflammatory changes. Spleen: Normal in size without focal abnormality. Adrenals/Urinary Tract: Adrenal glands are unremarkable. Kidneys are normal in size, without renal calculi or hydronephrosis. Stable 2.1 cm and 6.1 cm simple cysts are seen within the right kidney. Bladder is unremarkable. Stomach/Bowel: Surgical sutures are seen within the gastric region, with surgically anastomosed bowel seen within the mid left abdomen and along the expected portion of the mid to distal sigmoid colon. The appendix is not identified. No evidence of bowel dilatation. Vascular/Lymphatic: There is moderate severity aortic calcification and atherosclerosis. No enlarged abdominal or pelvic lymph nodes. Reproductive: The prostate gland is moderately enlarged an heterogeneous in appearance. Other: No abdominal wall hernia or abnormality. No abdominopelvic ascites. Musculoskeletal: No acute or significant osseous findings. IMPRESSION: 1. Status post cholecystectomy. 2. Stable simple cysts within the right kidney. 3. Stable postoperative changes within the gastric region. 4. Moderate severity aortic  calcification and atherosclerosis. 5. Enlarged, heterogeneous prostate gland. Correlation with  PSA values is recommended. Aortic Atherosclerosis (ICD10-I70.0). Electronically Signed   By: Virgina Norfolk M.D.   On: 08/24/2019 17:11   Impression/Plan: Very pleasant 69 year old gentleman with history of adenocarcinoma of the colon, status post resection and required multiple subsequent surgeries.  Now left with an ileorectal anastomosis.  He has a history of pancreatic exocrine insufficiency, multiple small bowel obstructions, GERD, short segment Barrett's esophagus.  He continues to have quite significant diarrhea with anywhere from 5-9 bowel movements daily.  Bowel regimen currently includes Creon 36,000 units, 3 capsules with meals and 2 with snacks.  Imodium 4 mg 3 times daily with meals.  Questran 2 g daily.  Continues to have significant watery stool.  No blood per rectum.  Overall weight is down about 10 pounds in the past 4 months.  Patient reports diarrhea has been most problematic since his surgery last year leaving him with ilio rectal anastomosis.  Looking back to the records however he has had pretty persistent chronic diarrhea dating back at least 2019.  He also has chronically dilated bile duct on multiple imaging studies for the past 2 to 3 years.  LFTs have been normal.  Last CT with moderate severity aortic calcification sclerosis, no mention of significant mesenteric artery stenosis although this is not a dedicated CTA.  Initially we will check stool studies to make sure that he did not pick up and infectious etiology to explain his persistent diarrhea.  We will continue current bowel regimen until stool studies pleated.  Further recommendations to follow at that point.

## 2019-09-12 NOTE — Patient Instructions (Signed)
1. Complete stool test. We will call with results and further recommendations.  2. Continue current bowel regimen for diarrhea.

## 2019-09-20 LAB — GASTROINTESTINAL PATHOGEN PANEL PCR
C. difficile Tox A/B, PCR: NOT DETECTED
Campylobacter, PCR: NOT DETECTED
Cryptosporidium, PCR: NOT DETECTED
E coli (ETEC) LT/ST PCR: NOT DETECTED
E coli (STEC) stx1/stx2, PCR: NOT DETECTED
E coli 0157, PCR: NOT DETECTED
Giardia lamblia, PCR: NOT DETECTED
Norovirus, PCR: NOT DETECTED
Rotavirus A, PCR: NOT DETECTED
Salmonella, PCR: NOT DETECTED
Shigella, PCR: NOT DETECTED

## 2019-09-20 LAB — C. DIFFICILE GDH AND TOXIN A/B
GDH ANTIGEN: NOT DETECTED
MICRO NUMBER:: 10653900
SPECIMEN QUALITY:: ADEQUATE
TOXIN A AND B: NOT DETECTED

## 2019-09-28 ENCOUNTER — Other Ambulatory Visit (HOSPITAL_COMMUNITY): Payer: Self-pay | Admitting: Nurse Practitioner

## 2019-09-28 DIAGNOSIS — C189 Malignant neoplasm of colon, unspecified: Secondary | ICD-10-CM

## 2019-10-08 NOTE — Progress Notes (Signed)
Stool studies neg. Discussed with Dr. [name]. Plan to increase Questran and add Vit D,E,A, and K. If no improvement in diarrhea in four weeks, then transition imodium to lomotil. No CTA at this time given lack of postprandial abdominal pain. Return ov in four weeks.

## 2019-10-10 ENCOUNTER — Telehealth: Payer: Self-pay | Admitting: Internal Medicine

## 2019-10-10 NOTE — Telephone Encounter (Signed)
Pt was returning call. [phone]

## 2019-10-12 ENCOUNTER — Encounter: Payer: Self-pay | Admitting: Internal Medicine

## 2019-10-12 NOTE — Progress Notes (Signed)
Patient scheduled.

## 2019-10-18 ENCOUNTER — Other Ambulatory Visit: Payer: Self-pay | Admitting: Internal Medicine

## 2019-10-29 ENCOUNTER — Other Ambulatory Visit (HOSPITAL_COMMUNITY): Payer: Self-pay | Admitting: Nurse Practitioner

## 2019-10-29 DIAGNOSIS — C189 Malignant neoplasm of colon, unspecified: Secondary | ICD-10-CM

## 2019-10-30 ENCOUNTER — Other Ambulatory Visit (HOSPITAL_COMMUNITY): Payer: Self-pay | Admitting: Nurse Practitioner

## 2019-10-30 DIAGNOSIS — C189 Malignant neoplasm of colon, unspecified: Secondary | ICD-10-CM

## 2019-10-31 MED ORDER — GABAPENTIN 100 MG PO CAPS: 100.0000 mg | ORAL_CAPSULE | Freq: Every day | ORAL | 2 refills | Status: DC

## 2019-11-20 ENCOUNTER — Ambulatory Visit: Payer: Medicare Other | Admitting: Internal Medicine

## 2019-11-22 ENCOUNTER — Other Ambulatory Visit: Payer: Self-pay

## 2019-11-22 ENCOUNTER — Inpatient Hospital Stay (HOSPITAL_COMMUNITY): Payer: Medicare Other | Attending: Hematology

## 2019-11-22 ENCOUNTER — Encounter (HOSPITAL_COMMUNITY): Payer: Self-pay

## 2019-11-22 VITALS — BP 118/68 | HR 70 | Temp 97.1°F | Resp 17

## 2019-11-22 DIAGNOSIS — N189 Chronic kidney disease, unspecified: Secondary | ICD-10-CM | POA: Diagnosis not present

## 2019-11-22 DIAGNOSIS — E559 Vitamin D deficiency, unspecified: Secondary | ICD-10-CM | POA: Diagnosis not present

## 2019-11-22 DIAGNOSIS — E611 Iron deficiency: Secondary | ICD-10-CM | POA: Diagnosis not present

## 2019-11-22 DIAGNOSIS — Z95828 Presence of other vascular implants and grafts: Secondary | ICD-10-CM

## 2019-11-22 DIAGNOSIS — Z85038 Personal history of other malignant neoplasm of large intestine: Secondary | ICD-10-CM | POA: Diagnosis present

## 2019-11-22 DIAGNOSIS — G629 Polyneuropathy, unspecified: Secondary | ICD-10-CM | POA: Diagnosis not present

## 2019-11-22 DIAGNOSIS — C189 Malignant neoplasm of colon, unspecified: Secondary | ICD-10-CM

## 2019-11-22 LAB — LACTATE DEHYDROGENASE: LDH: 151 U/L (ref 98–192)

## 2019-11-22 LAB — CBC WITH DIFFERENTIAL/PLATELET
Abs Immature Granulocytes: 0.09 10*3/uL — ABNORMAL HIGH (ref 0.00–0.07)
Basophils Absolute: 0 10*3/uL (ref 0.0–0.1)
Basophils Relative: 0 %
Eosinophils Absolute: 0.2 10*3/uL (ref 0.0–0.5)
Eosinophils Relative: 1 %
HCT: 43 % (ref 39.0–52.0)
Hemoglobin: 13.7 g/dL (ref 13.0–17.0)
Immature Granulocytes: 1 %
Lymphocytes Relative: 21 %
Lymphs Abs: 2.3 10*3/uL (ref 0.7–4.0)
MCH: 30.2 pg (ref 26.0–34.0)
MCHC: 31.9 g/dL (ref 30.0–36.0)
MCV: 94.7 fL (ref 80.0–100.0)
Monocytes Absolute: 1 10*3/uL (ref 0.1–1.0)
Monocytes Relative: 9 %
Neutro Abs: 7.7 10*3/uL (ref 1.7–7.7)
Neutrophils Relative %: 68 %
Platelets: 180 10*3/uL (ref 150–400)
RBC: 4.54 MIL/uL (ref 4.22–5.81)
RDW: 13.9 % (ref 11.5–15.5)
WBC: 11.3 10*3/uL — ABNORMAL HIGH (ref 4.0–10.5)
nRBC: 0 % (ref 0.0–0.2)

## 2019-11-22 LAB — COMPREHENSIVE METABOLIC PANEL
ALT: 19 U/L (ref 0–44)
AST: 30 U/L (ref 15–41)
Albumin: 3.4 g/dL — ABNORMAL LOW (ref 3.5–5.0)
Alkaline Phosphatase: 59 U/L (ref 38–126)
Anion gap: 9 (ref 5–15)
BUN: 17 mg/dL (ref 8–23)
CO2: 20 mmol/L — ABNORMAL LOW (ref 22–32)
Calcium: 8.1 mg/dL — ABNORMAL LOW (ref 8.9–10.3)
Chloride: 107 mmol/L (ref 98–111)
Creatinine, Ser: 1.66 mg/dL — ABNORMAL HIGH (ref 0.61–1.24)
GFR calc Af Amer: 48 mL/min — ABNORMAL LOW (ref >60)
GFR calc non Af Amer: 42 mL/min — ABNORMAL LOW (ref >60)
Glucose, Bld: 91 mg/dL (ref 70–99)
Potassium: 4.3 mmol/L (ref 3.5–5.1)
Sodium: 136 mmol/L (ref 135–145)
Total Bilirubin: 0.5 mg/dL (ref 0.3–1.2)
Total Protein: 6.6 g/dL (ref 6.5–8.1)

## 2019-11-22 LAB — IRON AND TIBC
Iron: 98 ug/dL (ref 45–182)
Saturation Ratios: 32 % (ref 17.9–39.5)
TIBC: 305 ug/dL (ref 250–450)
UIBC: 207 ug/dL

## 2019-11-22 LAB — FERRITIN: Ferritin: 141 ng/mL (ref 24–336)

## 2019-11-22 LAB — VITAMIN B12: Vitamin B-12: 4294 pg/mL — ABNORMAL HIGH (ref 180–914)

## 2019-11-22 MED ORDER — SODIUM CHLORIDE 0.9% FLUSH
10.0000 mL | INTRAVENOUS | Status: DC | PRN
  Administered 2019-11-22: 10 mL via INTRAVENOUS

## 2019-11-22 MED ORDER — HEPARIN SOD (PORK) LOCK FLUSH 100 UNIT/ML IV SOLN
500.0000 [IU] | Freq: Once | INTRAVENOUS | Status: AC
  Administered 2019-11-22: 500 [IU] via INTRAVENOUS

## 2019-11-22 NOTE — Progress Notes (Signed)
[name] presented for Portacath access and flush.  Portacath located left chest wall accessed with  H 20 needle.  Good blood return present. Portacath flushed with 67ml NS and 500U/29ml Heparin and needle removed intact.  Procedure tolerated well and without incident.

## 2019-11-22 NOTE — Addendum Note (Signed)
Addended by: Farley Ly on: 11/22/2019 11:50 AM   Modules accepted: Orders, SmartSet

## 2019-11-23 LAB — VITAMIN D 25 HYDROXY (VIT D DEFICIENCY, FRACTURES): Vit D, 25-Hydroxy: 110.79 ng/mL — ABNORMAL HIGH (ref 30–100)

## 2019-11-23 LAB — CEA: CEA: 2.5 ng/mL (ref 0.0–4.7)

## 2019-11-29 ENCOUNTER — Inpatient Hospital Stay (HOSPITAL_BASED_OUTPATIENT_CLINIC_OR_DEPARTMENT_OTHER): Payer: Medicare Other | Admitting: Hematology

## 2019-11-29 ENCOUNTER — Other Ambulatory Visit: Payer: Self-pay

## 2019-11-29 VITALS — BP 128/76 | HR 73 | Temp 98.3°F | Resp 16 | Wt 141.4 lb

## 2019-11-29 DIAGNOSIS — C189 Malignant neoplasm of colon, unspecified: Secondary | ICD-10-CM

## 2019-11-29 DIAGNOSIS — Z85038 Personal history of other malignant neoplasm of large intestine: Secondary | ICD-10-CM | POA: Diagnosis not present

## 2019-11-29 NOTE — Progress Notes (Signed)
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    CANCER STAGING: Cancer Staging Adenocarcinoma of colon with mucinous features Staging form: Colon and Rectum, AJCC 7th Edition - Clinical: Stage IIIB (T3, N1, M0) - Signed by Baird Cancer, PA on 09/22/2010   INTERVAL HISTORY:  [name], a 69 y.o. male, returns for routine follow-up of his stage III adenocarcinoma of left colon. Abdallah was last seen on 11/23/2018.  Today he reports feeling okay. He continues taking vitamin D and B12 daily.    REVIEW OF SYSTEMS:  Review of Systems  Constitutional: Positive for appetite change (moderately decreased) and fatigue (moderate).  HENT:   Positive for trouble swallowing.   Gastrointestinal: Positive for diarrhea.  All other systems reviewed and are negative.   PAST MEDICAL/SURGICAL HISTORY:  Past Medical History:  Diagnosis Date  . 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 Anemia   . Anxiety   . Arthritis   . Barrett's esophagus   . Blood transfusion   . Bowel obstruction ([place]) 05/13/2012   Recurrent  . Bronchitis   . Chest pain at rest   . Chronic abdominal pain   . Erosive esophagitis   . ETOH abuse    quit 03/2010  . GERD (gastroesophageal reflux disease)   . Hx of Clostridium difficile infection 01/2012  . Hypertension   . Ileus ([place])   . Iron deficiency anemia 03/23/2016  . Obstruction of bowel ([place]) 03/03/14  . Osteoporosis   . Personal history of PE (pulmonary embolism) 10/01/2010  . Pneumonia   . Pulmonary embolism ([place]) 02/2010  .  Recurrent upper respiratory infection (URI)   . Renal disorder   . S/P endoscopy September 28, 2010   erosive reflux esophagitis, Billroth I anatomy  . 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 Seizures ([place])   . Shortness of breath   . TIA (transient ischemic attack) 10/11  . Vitamin B12 deficiency    Past Surgical History:  Procedure Laterality Date  . ABDOMINAL ADHESION SURGERY  03/04/15   @ [facility]  . ABDOMINAL EXPLORATION SURGERY    . abdominal sugery     for bowel obstruction x 8, all in 1980s, except for one in 07/2010  . APPENDECTOMY  1980s  . Billroth 1 hemigastrectomy  1980s   per patient for benign duodenal tumor  . CARDIAC CATHETERIZATION  07/17/2012  . CHOLECYSTECTOMY  1980s  . COLON SURGERY  May 2012   left hemicolectomy, colon cancer found at time of surgery for bowel obstruction  . COLON SURGERY  10/12/2018   At [facility]: Resection of right colon and terminal ileum with creation of ileorectal anastomosis for large bowel obstruction.  Cecum and ascending colon noted to be attached to the rectum.  . COLONOSCOPY  03/18/2011   anastomosis at 35cm. Several adenomatous polyps removed. Sigmoid diverticulosis. Next TCS 02/2013  . COLONOSCOPY N/A 07/24/2012   SAY:TKZSWF post segmental resection with normal-appearing colonic anastomosis aside from an adjacent polyp-removed as described above. Rectal polyp-removed as described above. CT findings appear to have been artifactual.  tubular adenomas/prolapsed type polyp.  . COLONOSCOPY N/A 05/15/2015   Procedure: COLONOSCOPY;  Surgeon: [name], MD;  [facility];  Service: Endoscopy;  Laterality: N/A;  . COLONOSCOPY  09/26/2018   at [facility]: prep not adequate for colon cancer surveillance.  Prior end-to-end colonic anastomosis in the rectosigmoid region.  This was patent and characterized by healthy-appearing mucosa.  Anastomosis was traversed.  Normal terminal ileum.  . COLONOSCOPY WITH PROPOFOL N/A 11/04/2016   Dr. [name], status post subtotal  colectomy with normal-appearing residual lower GI tract.  Next colonoscopy in 5 years.  . ESOPHAGOGASTRODUODENOSCOPY  09/28/2010  . ESOPHAGOGASTRODUODENOSCOPY  12/01/2010   Cervical web status post dilation, erosive esophagitis, B1 hemigastrectomy, inflamed anastomosis  . ESOPHAGOGASTRODUODENOSCOPY  04/16/2011   excoriation at [facility] c/w trauma/M-W tear, friable gastric anastomosis, dilation efferent limb  . ESOPHAGOGASTRODUODENOSCOPY N/A 06/03/2014   Dr.[name]- cervcal esopphageal web s/p dilation. abnormal distal esophagus bx= barretts esophagus  . ESOPHAGOGASTRODUODENOSCOPY N/A 05/15/2015   Procedure: ESOPHAGOGASTRODUODENOSCOPY (EGD);  Surgeon: [name], MD;  [facility];  Service: Endoscopy;  Laterality: N/A;  230  . ESOPHAGOGASTRODUODENOSCOPY (EGD) WITH ESOPHAGEAL DILATION  02/25/2012   ATF:TDDUKGUR esophageal web-s/p dilation anddisruption as described above. Status post prior gastric with Billroth I configuration. Abnormal gastric mucosa at the anastomosis. Gastric biopsy showed mild chronic inflammation but no H. pylori   . ESOPHAGOGASTRODUODENOSCOPY (EGD) WITH PROPOFOL N/A 11/04/2016   Dr. [name]: Reflux esophagitis, small hiatal hernia status post hemigastrectomy.  Single patent efferent small bowel limb appeared normal, 2 x 2 centimeter tongue of salmon epithelium again seen, esophageal dilation.  Biopsies consistent with reflux changes, not Barrett's however this was confirmed on prior EGDs.  Offer 3-year follow-up EGD August 2021.  [name] HERNIA REPAIR     right inguinal  . MALONEY DILATION N/A 06/03/2014   Procedure: [name] DILATION;  Surgeon: [name], MD;  [facility];  Service: Endoscopy;  Laterality: N/A;  [name] DILATION N/A 05/15/2015   Procedure: [name] DILATION;  Surgeon: [name], MD;  [facility];  Service: Endoscopy;  Laterality: N/A;  . [name] DILATION N/A 11/04/2016   Procedure: [name] DILATION;  Surgeon: [name], MD;   [facility];  Service: Endoscopy;  Laterality: N/A;  . PORTACATH PLACEMENT    . SAVORY DILATION N/A 06/03/2014   Procedure: SAVORY DILATION;  Surgeon: [name], MD;  [facility];  Service: Endoscopy;  Laterality: N/A;    SOCIAL HISTORY:  Social History   Socioeconomic History  . Marital status: Married    Spouse name: Not on file  . Number of children: 3  . Years of education: Not on file  . Highest education level: Not on file  Occupational History    Employer: [employer]  Tobacco Use  . Smoking status: Former Smoker    Packs/day: 0.50    Years: 40.00    Pack years: 20.00    Types: Cigarettes    Quit date: 12/20/2012    Years since quitting: 6.9  . Smokeless tobacco: Never Used  Vaping Use  . Vaping Use: Never used  Substance and Sexual Activity  . Alcohol use: No  . Drug use: No  . Sexual activity: Never  Other Topics Concern  . Not on file  Social History Narrative  . Not on file   Social Determinants of Health   Financial Resource Strain:   . Difficulty of Paying Living Expenses: Not on  file  Food Insecurity:   . Worried About Charity fundraiser in the Last Year: Not on file  . Ran Out of Food in the Last Year: Not on file  Transportation Needs:   . Lack of Transportation (Medical): Not on file  . Lack of Transportation (Non-Medical): Not on file  Physical Activity:   . Days of Exercise per Week: Not on file  . Minutes of Exercise per Session: Not on file  Stress:   . Feeling of Stress : Not on file  Social Connections:   . Frequency of Communication with Friends and Family: Not on file  . Frequency of Social Gatherings with Friends and Family: Not on file  . Attends Religious Services: Not on file  . Active Member of Clubs or Organizations: Not on file  . Attends Archivist Meetings: Not on file  . Marital Status: Not on file  Intimate Partner Violence:   . Fear of Current or Ex-Partner: Not on file  .  Emotionally Abused: Not on file  . Physically Abused: Not on file  . Sexually Abused: Not on file    FAMILY HISTORY:  Family History  Problem Relation Age of Onset  . Hypertension Mother   . Arthritis Mother   . Pneumonia Mother   . Hypertension Father   . Heart attack Father   . Colon cancer Neg Hx     CURRENT MEDICATIONS:  Current Outpatient Medications  Medication Sig Dispense Refill  . ammonium lactate (AMLACTIN) 12 % cream See admin instructions. APPLY CREAM TO DRY SKIN ON LEGS ARMS HANDS AND BACK AREA ONCE TO TWICE DAILY (AVOID FACE GROIN UNDERARMS)  3  . atorvastatin (LIPITOR) 20 MG tablet Take 20 mg by mouth at bedtime.     . carvedilol (COREG) 25 MG tablet Take 0.5 tablets (12.5 mg total) by mouth 2 (two) times daily with a meal. (Patient taking differently: Take 25 mg by mouth 2 (two) times daily with a meal. ) 15 tablet 2  . Cholecalciferol (VITAMIN D) 2000 units CAPS Take 2,000 Units by mouth daily.    . cyclobenzaprine (FLEXERIL) 5 MG tablet TAKE 1 TABLET BY MOUTH ONCE DAILY AT BEDTIME AS NEEDED FOR 10 DAYS    . enoxaparin (LOVENOX) 60 MG/0.6ML injection     . gabapentin (NEURONTIN) 100 MG capsule Take 1 capsule (100 mg total) by mouth at bedtime. 30 capsule 2  . lipase/protease/amylase (CREON) 36000 UNITS CPEP capsule Take 2 capsules with meals and 1 capsule with snacks. (Patient taking differently: Take 3 capsules with meals and 2 capsule with snacks.) 900 capsule 3  . loperamide (IMODIUM) 2 MG capsule Take 2-4 mg by mouth as needed.     . mirtazapine (REMERON) 30 MG tablet Take 1 tablet (30 mg total) by mouth at bedtime. 30 tablet 6  . Multiple Vitamin (MULTIVITAMIN WITH MINERALS) TABS tablet Take 1 tablet by mouth daily.    . Nutritional Supplements (ENSURE PLUS HN) LIQD Take 1 Bottle daily by mouth.     . ondansetron (ZOFRAN ODT) 4 MG disintegrating tablet 4mg  ODT q4 hours prn nausea/vomit 12 tablet 0  . pantoprazole (PROTONIX) 40 MG tablet Take 1 tablet (40 mg  total) by mouth 2 (two) times daily before a meal. 180 tablet 3  . sucralfate (CARAFATE) 1 GM/10ML suspension Take 10 mLs (1 g total) by mouth 4 (four) times daily as needed. For breakthrough heartburn 420 mL 1  . traMADol (ULTRAM) 50 MG tablet Take 50 mg  by mouth daily as needed for moderate pain.     [name] zoledronic acid (RECLAST) 5 MG/100ML SOLN injection Inject 5 mg into the vein See admin instructions. Pt gets a dose yearly. Due again 2018     No current facility-administered medications for this visit.   Facility-Administered Medications Ordered in Other Visits  Medication Dose Route Frequency Provider Last Rate Last Admin  . heparin lock flush 100 unit/mL  500 Units Intravenous Once Kefalas, Thomas S, PA-C      . sodium chloride flush (NS) 0.9 % injection 10 mL  10 mL Intravenous PRN Holley Bouche, NP   10 mL at 11/05/16 1136    ALLERGIES:  No Known Allergies  PHYSICAL EXAM:  Performance status (ECOG): 1 - Symptomatic but completely ambulatory  There were no vitals filed for this visit. Wt Readings from Last 3 Encounters:  09/12/19 148 lb 3.2 oz (67.2 kg)  08/24/19 164 lb (74.4 kg)  07/17/19 152 lb 9.6 oz (69.2 kg)   Physical Exam Vitals reviewed.  Constitutional:      Appearance: Normal appearance.  Cardiovascular:     Rate and Rhythm: Normal rate and regular rhythm.     Pulses: Normal pulses.     Heart sounds: Normal heart sounds.  Pulmonary:     Effort: Pulmonary effort is normal.     Breath sounds: Normal breath sounds.  Abdominal:     Palpations: Abdomen is soft. There is no mass.     Tenderness: There is no abdominal tenderness.  Neurological:     General: No focal deficit present.     Mental Status: He is alert and oriented to person, place, and time.  Psychiatric:        Mood and Affect: Mood normal.        Behavior: Behavior normal.      LABORATORY DATA:  I have reviewed the labs as listed.  CBC Latest Ref Rng & Units 11/22/2019 08/24/2019 05/23/2019    WBC 4.0 - 10.5 K/uL 11.3(H) 10.1 8.2  Hemoglobin 13.0 - 17.0 g/dL 13.7 15.9 14.3  Hematocrit 39 - 52 % 43.0 48.1 44.5  Platelets 150 - 400 K/uL 180 185 153   CMP Latest Ref Rng & Units 11/22/2019 08/24/2019 05/23/2019  Glucose 70 - 99 mg/dL 91 111(H) 105(H)  BUN 8 - 23 mg/dL 17 17 8   Creatinine 0.61 - 1.24 mg/dL 1.66(H) 1.51(H) 1.34(H)  Sodium 135 - 145 mmol/L 136 139 140  Potassium 3.5 - 5.1 mmol/L 4.3 4.6 4.5  Chloride 98 - 111 mmol/L 107 105 107  CO2 22 - 32 mmol/L 20(L) 24 25  Calcium 8.9 - 10.3 mg/dL 8.1(L) 9.2 8.5(L)  Total Protein 6.5 - 8.1 g/dL 6.6 7.0 6.5  Total Bilirubin 0.3 - 1.2 mg/dL 0.5 0.8 0.5  Alkaline Phos 38 - 126 U/L 59 69 63  AST 15 - 41 U/L 30 18 20   ALT 0 - 44 U/L 19 17 18    Lab Results  Component Value Date   VD25OH 110.79 (H) 11/22/2019   VD25OH 39.3 10/28/2015   VD25OH 25.3 (L) 03/13/2015   Lab Results  Component Value Date   TIBC 305 11/22/2019   TIBC 306 05/23/2019   TIBC 253 11/16/2018   FERRITIN 141 11/22/2019   FERRITIN 96 05/23/2019   FERRITIN 334 11/16/2018   IRONPCTSAT 32 11/22/2019   IRONPCTSAT 37 05/23/2019   IRONPCTSAT 36 11/16/2018    DIAGNOSTIC IMAGING:  I have independently reviewed the scans and discussed with the  patient. No results found.   ASSESSMENT:  1.  Stage III (T3N1) adenocarcinoma the left colon: - Status post surgical resection in May 2012, 1 out of 18 lymph nodes positive, could only complete 7 out of 12 FOLFOX chemotherapy secondary to neuropathy. -Last EGD/colonoscopy on 05/15/2015 was normal. - CT scan in October 2018 was negative for metastatic disease. -Bowel resection in July last year secondary to small bowel obstruction at [facility].  2.  Iron deficiency: -This is from combination of CKD and relative iron deficiency. -Last Feraheme on 08/29/2018 and 09/05/2018.  3.  Peripheral neuropathy: - Neuropathy and the tingling and numbness in the feet. - He is taking gabapentin 100 mg at bedtime which is helping  with pins-and-needles sensation.   PLAN:  1.  Stage III (T3N1) adenocarcinoma the left colon: -No change in bowel habits.  No bleeding per rectum or melena.  Physical exam was normal. -We reviewed labs including CEA which was 2.5.  LFTs were normal. -I plan to see him back in 6 months for follow-up with a CEA level.  2.  Iron deficiency: -Hemoglobin is 13.7.  Ferritin is 141 and percent saturation is 32. -No parenteral iron therapy needed.  3.  Peripheral neuropathy: -Continue gabapentin at bedtime.  4.  Vitamin D deficiency: -He is taking vitamin D 2000 units daily.  Vitamin D level is 110.  I have asked him to cut back to 1000 units daily.    Orders placed this encounter:  No orders of the defined types were placed in this encounter.    Derek Jack, MD [place] [phone]   I, Milinda Antis, am acting as a scribe for Dr. Sanda Linger.  I, Derek Jack MD, have reviewed the above documentation for accuracy and completeness, and I agree with the above.

## 2019-11-29 NOTE — Patient Instructions (Signed)
[place] at [facility] Discharge Instructions  You were seen today by Dr. Delton Coombes. He went over your recent results. Purchase vitamin D over the counter and take 1,000 units daily. Dr. Delton Coombes will see you back in 6 months for labs and follow up.   Thank you for choosing [place] at [facility] to provide your oncology and hematology care.  To afford each patient quality time with our provider, please arrive at least 15 minutes before your scheduled appointment time.   If you have a lab appointment with the [facility] please come in thru the Main Entrance and check in at the main information desk  You need to re-schedule your appointment should you arrive 10 or more minutes late.  We strive to give you quality time with our providers, and arriving late affects you and other patients whose appointments are after yours.  Also, if you no show three or more times for appointments you may be dismissed from the clinic at the providers discretion.     Again, thank you for choosing [facility].  Our hope is that these requests will decrease the amount of time that you wait before being seen by our physicians.       _____________________________________________________________  Should you have questions after your visit to [facility], please contact our office at [phone] between the hours of 8:00 a.m. and 4:30 p.m.  Voicemails left after 4:00 p.m. will not be returned until the following business day.  For prescription refill requests, have your pharmacy contact our office and allow 72 hours.    Cancer Center Support Programs:   > Cancer Support Group  2nd Tuesday of the month 1pm-2pm, Journey Room

## 2019-12-11 ENCOUNTER — Other Ambulatory Visit: Payer: Self-pay | Admitting: Internal Medicine

## 2019-12-11 ENCOUNTER — Other Ambulatory Visit: Payer: Self-pay

## 2019-12-11 ENCOUNTER — Encounter: Payer: Self-pay | Admitting: Internal Medicine

## 2019-12-11 ENCOUNTER — Ambulatory Visit (INDEPENDENT_AMBULATORY_CARE_PROVIDER_SITE_OTHER): Payer: Medicare Other | Admitting: Internal Medicine

## 2019-12-11 VITALS — BP 139/84 | HR 72 | Temp 97.0°F | Ht 69.0 in | Wt 144.2 lb

## 2019-12-11 DIAGNOSIS — R197 Diarrhea, unspecified: Secondary | ICD-10-CM | POA: Diagnosis not present

## 2019-12-11 MED ORDER — DIPHENOXYLATE-ATROPINE 2.5-0.025 MG PO TABS
1.0000 | ORAL_TABLET | Freq: Two times a day (BID) | ORAL | 1 refills | Status: DC | PRN
Start: 2019-12-11 — End: 2020-02-21

## 2019-12-11 MED ORDER — DIPHENOXYLATE-ATROPINE 2.5-0.025 MG PO TABS
2.0000 | ORAL_TABLET | Freq: Two times a day (BID) | ORAL | 1 refills | Status: DC | PRN
Start: 2019-12-11 — End: 2019-12-11

## 2019-12-11 NOTE — Progress Notes (Signed)
Primary Care Physician:  [name], MD Primary Gastroenterologist:  Dr. [name]  Pre-Procedure History & Physical: HPI:  [name] is a 69 y.o. male here for follow-up of diarrhea malabsorption.  History of adenocarcinoma of the colon.  Status post multiple bowel surgeries for cancer, recurrent bowel obstruction.  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  He is lost 4 pounds since he was last seen here.  He describes a bout of small bowel obstruction documented on plain films by PCP in July.[name]  He did go to the hospital;  he went home and backed off on his diet for couple of days and things settle down.  He has been compliant with his medication regimen.  6 [place] to  5-7 stool consistency on average.  Stool studies negative for infection last time he was here.  He does note recurrent esophageal dysphagia to solids.  Reflux well controlled on Protonix.  Similar dysphagia symptoms 2018;  history of short segment Barrett's esophagus but not borne out histologically on last EGD.  He was empirically dilated and did well until recently.  He is due for surveillance colonoscopy 2023 (subtotal colectomy with rectum and most distal colon remaining).  He has not passed any blood no melena.  Aside from pancreatic enzyme supplement and PPI, he takes cholestyramine 4 g daily and Imodium on average 6 tablets daily.  He does take fat-soluble vitamin supplements.  Past Medical History:  Diagnosis Date  . 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 Anemia   . Anxiety   . Arthritis   . Barrett's esophagus   . Blood transfusion   . Bowel obstruction ([place]) 05/13/2012   Recurrent  . Bronchitis   . Chest pain at rest   . Chronic abdominal pain   . Erosive esophagitis   . ETOH abuse    quit 03/2010  . GERD (gastroesophageal reflux disease)   . Hx of Clostridium difficile infection 01/2012  . Hypertension   .  Ileus ([place])   . Iron deficiency anemia 03/23/2016  . Obstruction of bowel ([place]) 03/03/14  . Osteoporosis   . Personal history of PE (pulmonary embolism) 10/01/2010  . Pneumonia   . Pulmonary embolism ([place]) 02/2010  . Recurrent upper respiratory infection (URI)   . Renal disorder   . S/P endoscopy September 28, 2010   erosive reflux esophagitis, Billroth I anatomy  . 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 Seizures ([place])   . Shortness of breath   . TIA (transient ischemic attack) 10/11  . Vitamin B12 deficiency     Past Surgical History:  Procedure Laterality Date  . ABDOMINAL ADHESION SURGERY  03/04/15   @ [facility]  . ABDOMINAL EXPLORATION SURGERY    . abdominal sugery     for bowel obstruction x 8, all in 1980s, except for one in 07/2010  . APPENDECTOMY  1980s  . Billroth 1 hemigastrectomy  1980s   per patient for benign duodenal tumor  . CARDIAC CATHETERIZATION  07/17/2012  . CHOLECYSTECTOMY  1980s  . COLON SURGERY  May 2012   left hemicolectomy, colon cancer found at time of surgery for bowel obstruction  . COLON SURGERY  10/12/2018   At [facility]: Resection of right colon and terminal ileum with creation of ileorectal anastomosis for large bowel obstruction.  Cecum and ascending colon noted to be attached to the rectum.  . COLONOSCOPY  03/18/2011   anastomosis at 35cm. Several adenomatous  polyps removed. Sigmoid diverticulosis. Next TCS 02/2013  . COLONOSCOPY N/A 07/24/2012   WEX:HBZJIR post segmental resection with normal-appearing colonic anastomosis aside from an adjacent polyp-removed as described above. Rectal polyp-removed as described above. CT findings appear to have been artifactual. tubular adenomas/prolapsed type polyp.  . COLONOSCOPY N/A 05/15/2015   Procedure: COLONOSCOPY;  Surgeon: [name], MD;  [facility];  Service: Endoscopy;  Laterality: N/A;  . COLONOSCOPY  09/26/2018   at [facility]: prep not adequate for colon cancer surveillance.  Prior end-to-end colonic anastomosis  in the rectosigmoid region.  This was patent and characterized by healthy-appearing mucosa.  Anastomosis was traversed.  Normal terminal ileum.  . COLONOSCOPY WITH PROPOFOL N/A 11/04/2016   Dr. [name], status post subtotal colectomy with normal-appearing residual lower GI tract.  Next colonoscopy in 5 years.  . ESOPHAGOGASTRODUODENOSCOPY  09/28/2010  . ESOPHAGOGASTRODUODENOSCOPY  12/01/2010   Cervical web status post dilation, erosive esophagitis, B1 hemigastrectomy, inflamed anastomosis  . ESOPHAGOGASTRODUODENOSCOPY  04/16/2011   excoriation at GEJ c/w trauma/M-W tear, friable gastric anastomosis, dilation efferent limb  . ESOPHAGOGASTRODUODENOSCOPY N/A 06/03/2014   Dr.Dauntae Derusha- cervcal esopphageal web s/p dilation. abnormal distal esophagus bx= barretts esophagus  . ESOPHAGOGASTRODUODENOSCOPY N/A 05/15/2015   Procedure: ESOPHAGOGASTRODUODENOSCOPY (EGD);  Surgeon: [name], MD;  [facility];  Service: Endoscopy;  Laterality: N/A;  230  . ESOPHAGOGASTRODUODENOSCOPY (EGD) WITH ESOPHAGEAL DILATION  02/25/2012   CVE:LFYBOFBP esophageal web-s/p dilation anddisruption as described above. Status post prior gastric with Billroth I configuration. Abnormal gastric mucosa at the anastomosis. Gastric biopsy showed mild chronic inflammation but no H. pylori   . ESOPHAGOGASTRODUODENOSCOPY (EGD) WITH PROPOFOL N/A 11/04/2016   Dr. [name]: Reflux esophagitis, small hiatal hernia status post hemigastrectomy.  Single patent efferent small bowel limb appeared normal, 2 x 2 centimeter tongue of salmon epithelium again seen, esophageal dilation.  Biopsies consistent with reflux changes, not Barrett's however this was confirmed on prior EGDs.  Offer 3-year follow-up EGD August 2021.  [name] HERNIA REPAIR     right inguinal  . MALONEY DILATION N/A 06/03/2014   Procedure: [name] DILATION;  Surgeon: [name], MD;  [facility];  Service: Endoscopy;  Laterality: N/A;  [name] DILATION N/A 05/15/2015    Procedure: [name] DILATION;  Surgeon: [name], MD;  [facility];  Service: Endoscopy;  Laterality: N/A;  . [name] DILATION N/A 11/04/2016   Procedure: [name] DILATION;  Surgeon: [name], MD;  [facility];  Service: Endoscopy;  Laterality: N/A;  . PORTACATH PLACEMENT    . SAVORY DILATION N/A 06/03/2014   Procedure: SAVORY DILATION;  Surgeon: [name], MD;  [facility];  Service: Endoscopy;  Laterality: N/A;    Prior to Admission medications   Medication Sig Start Date End Date Taking? Authorizing Provider  ammonium lactate (AMLACTIN) 12 % cream See admin instructions. APPLY CREAM TO DRY SKIN ON LEGS ARMS HANDS AND BACK AREA ONCE TO TWICE DAILY (AVOID FACE GROIN UNDERARMS) 11/22/17  Yes [provider]  atorvastatin (LIPITOR) 20 MG tablet Take 20 mg by mouth at bedtime.    Yes [provider]  carvedilol (COREG) 25 MG tablet Take 0.5 tablets (12.5 mg total) by mouth 2 (two) times daily with a meal. Patient taking differently: Take 25 mg by mouth 2 (two) times daily with a meal.  05/06/19  Yes Emokpae, Courage, MD  Cholecalciferol (VITAMIN D) 2000 units CAPS Take 2,000 Units by mouth daily.  Yes [provider]  cholestyramine Lucrezia Starch) 4 GM/DOSE powder Take by mouth daily.   Yes [provider]  cyclobenzaprine (FLEXERIL) 5 MG tablet TAKE 1 TABLET BY MOUTH ONCE DAILY AT BEDTIME AS NEEDED FOR 10 DAYS 11/07/19  Yes [provider]  enoxaparin (LOVENOX) 60 MG/0.6ML injection  11/05/19  Yes [provider]  gabapentin (NEURONTIN) 100 MG capsule Take 1 capsule (100 mg total) by mouth at bedtime. 10/31/19  Yes Derek Jack, MD  lipase/protease/amylase (CREON) 36000 UNITS CPEP capsule Take 2 capsules with meals and 1 capsule with snacks. Patient taking differently: Take 3 capsules with meals and 2 capsule with snacks. 04/25/19  Yes Mahala Menghini, PA-C  loperamide (IMODIUM) 2 MG capsule Take  2-4 mg by mouth as needed.  11/20/18  Yes [provider]  mirtazapine (REMERON) 30 MG tablet Take 1 tablet (30 mg total) by mouth at bedtime. 08/13/19  Yes Derek Jack, MD  Multiple Vitamin (MULTIVITAMIN WITH MINERALS) TABS tablet Take 1 tablet by mouth daily.   Yes [provider]  Nutritional Supplements (ENSURE PLUS HN) LIQD Take 1 Bottle daily by mouth.    Yes [provider]  ondansetron (ZOFRAN ODT) 4 MG disintegrating tablet 4mg  ODT q4 hours prn nausea/vomit 08/24/19  Yes Milton Ferguson, MD  pantoprazole (PROTONIX) 40 MG tablet Take 1 tablet (40 mg total) by mouth 2 (two) times daily before a meal. 04/25/19  Yes Mahala Menghini, PA-C  sucralfate (CARAFATE) 1 GM/10ML suspension Take 10 mLs (1 g total) by mouth 4 (four) times daily as needed. For breakthrough heartburn 04/25/19  Yes Mahala Menghini, PA-C  traMADol (ULTRAM) 50 MG tablet Take 50 mg by mouth daily as needed for moderate pain.  04/14/18  Yes [provider]  zoledronic acid (RECLAST) 5 MG/100ML SOLN injection Inject 5 mg into the vein See admin instructions. Pt gets a dose yearly. Due again 2018   Yes [provider]  gabapentin (NEURONTIN) 100 MG capsule Take 1 capsule by mouth at bedtime 12/19/18   Lockamy, Randi L, NP-C    Allergies as of 12/11/2019  . (No Known Allergies)    Family History  Problem Relation Age of Onset  . Hypertension Mother   . Arthritis Mother   . Pneumonia Mother   . Hypertension Father   . Heart attack Father   . Colon cancer Neg Hx     Social History   Socioeconomic History  . Marital status: Married    Spouse name: Not on file  . Number of children: 3  . Years of education: Not on file  . Highest education level: Not on file  Occupational History    Employer: [employer]  Tobacco Use  . Smoking status: Former Smoker    Packs/day: 0.50    Years: 40.00    Pack years: 20.00    Types: Cigarettes    Quit date: 12/20/2012    Years since  quitting: 6.9  . Smokeless tobacco: Never Used  Vaping Use  . Vaping Use: Never used  Substance and Sexual Activity  . Alcohol use: No  . Drug use: No  . Sexual activity: Never  Other Topics Concern  . Not on file  Social History Narrative  . Not on file   Social Determinants of Health   Financial Resource Strain:   . Difficulty of Paying Living Expenses: Not on file  Food Insecurity:   . Worried About Charity fundraiser in the Last Year: Not  on file  . Ran Out of Food in the Last Year: Not on file  Transportation Needs:   . Lack of Transportation (Medical): Not on file  . Lack of Transportation (Non-Medical): Not on file  Physical Activity:   . Days of Exercise per Week: Not on file  . Minutes of Exercise per Session: Not on file  Stress:   . Feeling of Stress : Not on file  Social Connections:   . Frequency of Communication with Friends and Family: Not on file  . Frequency of Social Gatherings with Friends and Family: Not on file  . Attends Religious Services: Not on file  . Active Member of Clubs or Organizations: Not on file  . Attends Archivist Meetings: Not on file  . Marital Status: Not on file  Intimate Partner Violence:   . Fear of Current or Ex-Partner: Not on file  . Emotionally Abused: Not on file  . Physically Abused: Not on file  . Sexually Abused: Not on file    Review of Systems: See HPI, otherwise negative ROS  Physical Exam: BP 139/84   Pulse 72   Temp (!) 97 F (36.1 C) (Oral)   Ht 5\' 9"  (1.753 m)   Wt 144 lb 3.2 oz (65.4 kg)   BMI 21.29 kg/m  General:   Alert,  , pleasant and cooperative in NAD Neck:  Supple; no masses or thyromegaly. No significant cervical adenopathy. Lungs:  Clear throughout to auscultation.   No wheezes, crackles, or rhonchi. No acute distress. Heart:  Regular rate and rhythm; no murmurs, clicks, rubs,  or gallops. Abdomen: Nondistended.  Multiple surgical scars.  Soft and nontender.  No appreciable mass or  hernia appreciated   Pulses:  Normal pulses noted. Extremities:  Without clubbing or edema.  Impression/Plan: 69 year old gentleman with a history of colon cancer  - status post a multitude of GI surgeries.  He has a history of pancreatic exocrine insufficiency.  Diarrhea secondary to malabsorption /  multifactorial in etiology. Still having upwards of 6 bowel movements daily.  He has lost 4 pounds. He is asked about another agent for diarrhea. Esophageal dysphagia-recurrent.  He had a fairly good response to empiric dilation previously.  History of pulmonary embolism on chronic Lovenox therapy.  Recommendations:  Schedule an EGD with esophageal dilation - Propofol - ASA III.  Dysphagia and GERD not to be done before 2 pm so not to require alteration in Lovenox dosing.  The risks, benefits, limitations, alternatives and imponderables have been reviewed with the patient. Potential for esophageal dilation, biopsy, etc. have also been reviewed.  Questions have been answered. All parties agreeable.  Stop Imodium;  Trial of Lomotil 5 mg tablet twice daily as needed for diarrhea (disp 60 with one refill)  Keep a stool diary - call us in 2 weeks and let us know how you are doing  Continue Cholestyramine, pancreatic enzymes, protonix and multivitamins  Surveillance colonoscopy 2023.  Further recommendations to follow      Notice: This dictation was prepared with Dragon dictation along with smaller phrase technology. Any transcriptional errors that result from this process are unintentional and may not be corrected upon review.

## 2019-12-11 NOTE — Patient Instructions (Addendum)
Schedule an EGD with esophageal dilation - Propofol - ASA III.  Dysphagia and GERD not to be done before 2 pm so not to require alteration in Lovenox dosing  Stop Imodium;  Trial of Lomotil 5 mg tablet twice daily as needed for diarrhea (disp 60 with one refill)  Keep a stool diary - call us in 2 weeks and let us know how you are doing  Continue Cholestyramine, pancreatic enzymes, protonix and multivitamins  Further recommendations to follow

## 2019-12-11 NOTE — Addendum Note (Signed)
Addended by: Claudina Lick on: 12/11/2019 01:09 PM   Modules accepted: Orders

## 2019-12-12 ENCOUNTER — Telehealth: Payer: Self-pay

## 2019-12-12 NOTE — Telephone Encounter (Signed)
Thanks

## 2019-12-12 NOTE — Telephone Encounter (Signed)
Per Dr.[name]- change lomotil rx from 5mg  bid to 2.5mg  bid and let pt know to try it for 2 weeks and then call and let us know how he is doing. We may have to increase it to 5mg  if he does not respond to the 2.5mg . Changed Rx to 2.5mg  and sent in the the pharmacy yesterday afternoon. Called pt yesterday afternoon, NA, LMOM with instructions and asked him to call back if he had any questions and to call in 2 weeks and let us know how he is doing.

## 2019-12-13 ENCOUNTER — Telehealth: Payer: Self-pay | Admitting: *Deleted

## 2019-12-13 NOTE — Telephone Encounter (Signed)
Called pt. He has been scheduled for EGD/DIL with propofol, ASA 3 with Dr. [name] on 12/13 at 3:00pm. Per Dr. [name] recent visit, schedule 2pm or later. Pt aware will mail instructions with pre-op/covid test appt.

## 2019-12-14 ENCOUNTER — Encounter: Payer: Self-pay | Admitting: *Deleted

## 2020-01-14 ENCOUNTER — Telehealth: Payer: Self-pay | Admitting: Internal Medicine

## 2020-01-14 NOTE — Telephone Encounter (Signed)
Patient called in noting no improvement with diarrhea with Lomotil twice daily.  I recommend he increase it to 5 mg before meals and at bedtime  Arrange an office visit with APP in  about 4 to 6 weeks

## 2020-01-14 NOTE — Telephone Encounter (Signed)
Noted. Spoke with pt. Pt has an apt scheduled for 02/12/20.

## 2020-01-15 ENCOUNTER — Telehealth: Payer: Self-pay | Admitting: Internal Medicine

## 2020-01-15 NOTE — Telephone Encounter (Signed)
Patient communication noted.  I recommend the Lomotil be increased to 2.5 mg before meals and at bedtime (up to 4 times daily).  I would like to patient to come back and see an app and the next 3 to 4 weeks.

## 2020-01-15 NOTE — Telephone Encounter (Signed)
Please schedule appt

## 2020-01-16 NOTE — Telephone Encounter (Signed)
Patient scheduled.

## 2020-01-21 ENCOUNTER — Other Ambulatory Visit: Payer: Self-pay | Admitting: Gastroenterology

## 2020-01-22 ENCOUNTER — Other Ambulatory Visit: Payer: Self-pay

## 2020-01-22 NOTE — Telephone Encounter (Signed)
Pharmacy sent refill request, need new script for 1 packet. Instructions are to dissolve 1/2 packet in water and drink once daily. Do not take within 2 hours before or after other medications.

## 2020-01-23 MED ORDER — CHOLESTYRAMINE 4 GM/DOSE PO POWD
ORAL | 3 refills | Status: DC
Start: 2020-01-23 — End: 2020-04-29

## 2020-01-23 NOTE — Telephone Encounter (Signed)
New rx sent per request

## 2020-01-29 ENCOUNTER — Other Ambulatory Visit (HOSPITAL_COMMUNITY): Payer: Self-pay | Admitting: Hematology

## 2020-01-29 DIAGNOSIS — C189 Malignant neoplasm of colon, unspecified: Secondary | ICD-10-CM

## 2020-02-12 ENCOUNTER — Encounter: Payer: Self-pay | Admitting: Internal Medicine

## 2020-02-12 ENCOUNTER — Other Ambulatory Visit: Payer: Self-pay | Admitting: Internal Medicine

## 2020-02-12 ENCOUNTER — Ambulatory Visit (INDEPENDENT_AMBULATORY_CARE_PROVIDER_SITE_OTHER): Payer: Medicare Other | Admitting: Internal Medicine

## 2020-02-12 ENCOUNTER — Other Ambulatory Visit: Payer: Self-pay

## 2020-02-12 VITALS — BP 139/84 | HR 68 | Temp 96.9°F | Ht 69.0 in | Wt 146.8 lb

## 2020-02-12 DIAGNOSIS — R197 Diarrhea, unspecified: Secondary | ICD-10-CM | POA: Diagnosis not present

## 2020-02-12 DIAGNOSIS — R1319 Other dysphagia: Secondary | ICD-10-CM | POA: Diagnosis not present

## 2020-02-12 DIAGNOSIS — R634 Abnormal weight loss: Secondary | ICD-10-CM | POA: Diagnosis not present

## 2020-02-12 NOTE — H&P (View-Only) (Signed)
Primary Care Physician:  [name], MD Primary Gastroenterologist:  Dr. [name]  Pre-Procedure History & Physical: HPI:  [name] is a 69 y.o. male here for follow-up of weight loss, diarrhea.  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 Having 6-8 bowel movements-watery loose throughout the day.  If he gets up at night to have a snack or to urinate, he has a bowel movement. He has gained 2 pounds since his last office visit. He is set up to have an EGD with esophageal dilation December 13 at 3 PM.  The timing of procedure so as not to  Alter his Lovenox regimen. I have reviewed the chart at length today.  No TSH since 2019.  Had most of his colon removed.  He has had an unknown potentially extensive amount of small bowel removed with multiple surgical procedures over the years.  I see no new medications to implicate in terms of diarrhea side effect. GIP and C. difficile negative over the summer. He is taking a multivitamin supplement daily. He tells me he has no difficulties with eating no early satiety, nausea or vomiting aside from esophageal dysphagia.[name]  He says he eats 3 meals daily with; snacks in between. Past Medical History:  Diagnosis Date  . 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 Anemia   . Anxiety   . Arthritis   . Barrett's esophagus   . Blood transfusion   . Bowel obstruction ([place]) 05/13/2012   Recurrent  . Bronchitis   . Chest pain at rest   . Chronic abdominal pain   . Erosive esophagitis   . ETOH abuse    quit 03/2010  . GERD (gastroesophageal reflux disease)   . Hx of Clostridium difficile infection 01/2012  . Hypertension   . Ileus ([place])   . Iron deficiency anemia 03/23/2016  . Obstruction of bowel ([place]) 03/03/14  . Osteoporosis   . Personal history of PE (pulmonary embolism) 10/01/2010  . Pneumonia   . Pulmonary embolism ([place]) 02/2010  . Recurrent upper respiratory infection (URI)   .  Renal disorder   . S/P endoscopy September 28, 2010   erosive reflux esophagitis, Billroth I anatomy  . 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 Seizures ([place])   . Shortness of breath   . TIA (transient ischemic attack) 10/11  . Vitamin B12 deficiency     Past Surgical History:  Procedure Laterality Date  . ABDOMINAL ADHESION SURGERY  03/04/15   @ [facility]  . ABDOMINAL EXPLORATION SURGERY    . abdominal sugery     for bowel obstruction x 8, all in 1980s, except for one in 07/2010  . APPENDECTOMY  1980s  . Billroth 1 hemigastrectomy  1980s   per patient for benign duodenal tumor  . CARDIAC CATHETERIZATION  07/17/2012  . CHOLECYSTECTOMY  1980s  . COLON SURGERY  May 2012   left hemicolectomy, colon cancer found at time of surgery for bowel obstruction  . COLON SURGERY  10/12/2018   At [facility]: Resection of right colon and terminal ileum with creation of ileorectal anastomosis for large bowel obstruction.  Cecum and ascending colon noted to be attached to the rectum.  . COLONOSCOPY  03/18/2011   anastomosis at 35cm. Several adenomatous polyps removed. Sigmoid diverticulosis. Next TCS 02/2013  . COLONOSCOPY N/A 07/24/2012   EQA:STMHDQ post segmental resection with normal-appearing colonic anastomosis aside from an adjacent polyp-removed as described above. Rectal polyp-removed as described above.  CT findings appear to have been artifactual. tubular adenomas/prolapsed type polyp.  . COLONOSCOPY N/A 05/15/2015   Procedure: COLONOSCOPY;  Surgeon: [name], MD;  [facility];  Service: Endoscopy;  Laterality: N/A;  . COLONOSCOPY  09/26/2018   at [facility]: prep not adequate for colon cancer surveillance.  Prior end-to-end colonic anastomosis in the rectosigmoid region.  This was patent and characterized by healthy-appearing mucosa.  Anastomosis was traversed.  Normal terminal ileum.  . COLONOSCOPY WITH PROPOFOL N/A 11/04/2016   Dr. [name], status post subtotal colectomy with normal-appearing residual lower  GI tract.  Next colonoscopy in 5 years.  . ESOPHAGOGASTRODUODENOSCOPY  09/28/2010  . ESOPHAGOGASTRODUODENOSCOPY  12/01/2010   Cervical web status post dilation, erosive esophagitis, B1 hemigastrectomy, inflamed anastomosis  . ESOPHAGOGASTRODUODENOSCOPY  04/16/2011   excoriation at GEJ c/w trauma/M-W tear, friable gastric anastomosis, dilation efferent limb  . ESOPHAGOGASTRODUODENOSCOPY N/A 06/03/2014   Dr.Thelonious Kauffmann- cervcal esopphageal web s/p dilation. abnormal distal esophagus bx= barretts esophagus  . ESOPHAGOGASTRODUODENOSCOPY N/A 05/15/2015   Procedure: ESOPHAGOGASTRODUODENOSCOPY (EGD);  Surgeon: [name], MD;  [facility];  Service: Endoscopy;  Laterality: N/A;  230  . ESOPHAGOGASTRODUODENOSCOPY (EGD) WITH ESOPHAGEAL DILATION  02/25/2012   FVC:BSWHQPRF esophageal web-s/p dilation anddisruption as described above. Status post prior gastric with Billroth I configuration. Abnormal gastric mucosa at the anastomosis. Gastric biopsy showed mild chronic inflammation but no H. pylori   . ESOPHAGOGASTRODUODENOSCOPY (EGD) WITH PROPOFOL N/A 11/04/2016   Dr. [name]: Reflux esophagitis, small hiatal hernia status post hemigastrectomy.  Single patent efferent small bowel limb appeared normal, 2 x 2 centimeter tongue of salmon epithelium again seen, esophageal dilation.  Biopsies consistent with reflux changes, not Barrett's however this was confirmed on prior EGDs.  Offer 3-year follow-up EGD August 2021.  [name] HERNIA REPAIR     right inguinal  . MALONEY DILATION N/A 06/03/2014   Procedure: [name] DILATION;  Surgeon: [name], MD;  [facility];  Service: Endoscopy;  Laterality: N/A;  [name] DILATION N/A 05/15/2015   Procedure: [name] DILATION;  Surgeon: [name], MD;  [facility];  Service: Endoscopy;  Laterality: N/A;  . [name] DILATION N/A 11/04/2016   Procedure: [name] DILATION;  Surgeon: [name], MD;  [facility];  Service: Endoscopy;   Laterality: N/A;  . PORTACATH PLACEMENT    . SAVORY DILATION N/A 06/03/2014   Procedure: SAVORY DILATION;  Surgeon: [name], MD;  [facility];  Service: Endoscopy;  Laterality: N/A;    Prior to Admission medications   Medication Sig Start Date End Date Taking? Authorizing Provider  ammonium lactate (AMLACTIN) 12 % cream See admin instructions. APPLY CREAM TO DRY SKIN ON LEGS ARMS HANDS AND BACK AREA ONCE TO TWICE DAILY (AVOID FACE GROIN UNDERARMS) 11/22/17  Yes [provider]  atorvastatin (LIPITOR) 20 MG tablet Take 20 mg by mouth at bedtime.    Yes [provider]  carvedilol (COREG) 25 MG tablet Take 0.5 tablets (12.5 mg total) by mouth 2 (two) times daily with a meal. Patient taking differently: Take 25 mg by mouth 2 (two) times daily with a meal.  05/06/19  Yes Emokpae, Courage, MD  Cholecalciferol (VITAMIN D) 2000 units CAPS Take 2,000 Units by mouth daily.   Yes [provider]  cholestyramine Lucrezia Starch) 4 GM/DOSE powder Dissolve 1/2 packet in water and take once daily Patient taking differently: Dissolve 1 packet in water and take once daily 01/23/20  Yes Gordy Levan,  Eric A, NP  cyclobenzaprine (FLEXERIL) 5 MG tablet TAKE 1 TABLET BY MOUTH ONCE DAILY AT BEDTIME AS NEEDED FOR 10 DAYS 11/07/19  Yes [provider]  diphenoxylate-atropine (LOMOTIL) 2.5-0.025 MG tablet Take 1 tablet by mouth 2 (two) times daily as needed for diarrhea or loose stools. 12/11/19  Yes Jamy Whyte, Cristopher Estimable, MD  enoxaparin (LOVENOX) 60 MG/0.6ML injection  11/05/19  Yes [provider]  gabapentin (NEURONTIN) 100 MG capsule Take 1 capsule by mouth at bedtime 01/29/20  Yes Derek Jack, MD  lipase/protease/amylase (CREON) 36000 UNITS CPEP capsule Take 2 capsules with meals and 1 capsule with snacks. Patient taking differently: Take 3 capsules with meals and 2 capsule with snacks. 04/25/19  Yes Mahala Menghini, PA-C  mirtazapine (REMERON) 30 MG tablet Take 1 tablet  (30 mg total) by mouth at bedtime. 08/13/19  Yes Derek Jack, MD  Multiple Vitamin (MULTIVITAMIN WITH MINERALS) TABS tablet Take 1 tablet by mouth daily.   Yes [provider]  Nutritional Supplements (ENSURE PLUS HN) LIQD Take 1 Bottle daily by mouth.    Yes [provider]  ondansetron (ZOFRAN ODT) 4 MG disintegrating tablet 4mg  ODT q4 hours prn nausea/vomit 08/24/19  Yes Milton Ferguson, MD  pantoprazole (PROTONIX) 40 MG tablet Take 1 tablet (40 mg total) by mouth 2 (two) times daily before a meal. 04/25/19  Yes Mahala Menghini, PA-C  sucralfate (CARAFATE) 1 GM/10ML suspension Take 10 mLs (1 g total) by mouth 4 (four) times daily as needed. For breakthrough heartburn 04/25/19  Yes Mahala Menghini, PA-C  traMADol (ULTRAM) 50 MG tablet Take 50 mg by mouth daily as needed for moderate pain.  04/14/18  Yes [provider]  zoledronic acid (RECLAST) 5 MG/100ML SOLN injection Inject 5 mg into the vein See admin instructions. Pt gets a dose yearly. Due again 2018   Yes [provider]  loperamide (IMODIUM) 2 MG capsule Take 2-4 mg by mouth as needed.  Patient not taking: Reported on 02/12/2020 11/20/18   [provider]  gabapentin (NEURONTIN) 100 MG capsule Take 1 capsule by mouth at bedtime 12/19/18   Lockamy, Randi L, NP-C    Allergies as of 02/12/2020  . (No Known Allergies)    Family History  Problem Relation Age of Onset  . Hypertension Mother   . Arthritis Mother   . Pneumonia Mother   . Hypertension Father   . Heart attack Father   . Colon cancer Neg Hx     Social History   Socioeconomic History  . Marital status: Married    Spouse name: Not on file  . Number of children: 3  . Years of education: Not on file  . Highest education level: Not on file  Occupational History    Employer: [employer]  Tobacco Use  . Smoking status: Former Smoker    Packs/day: 0.50    Years: 40.00    Pack years: 20.00    Types: Cigarettes    Quit  date: 12/20/2012    Years since quitting: 7.1  . Smokeless tobacco: Never Used  Vaping Use  . Vaping Use: Never used  Substance and Sexual Activity  . Alcohol use: No  . Drug use: No  . Sexual activity: Never  Other Topics Concern  . Not on file  Social History Narrative  . Not on file   Social Determinants of Health   Financial Resource Strain:   . Difficulty of Paying Living Expenses: Not on file  Food  Insecurity:   . Worried About Charity fundraiser in the Last Year: Not on file  . Ran Out of Food in the Last Year: Not on file  Transportation Needs:   . Lack of Transportation (Medical): Not on file  . Lack of Transportation (Non-Medical): Not on file  Physical Activity:   . Days of Exercise per Week: Not on file  . Minutes of Exercise per Session: Not on file  Stress:   . Feeling of Stress : Not on file  Social Connections:   . Frequency of Communication with Friends and Family: Not on file  . Frequency of Social Gatherings with Friends and Family: Not on file  . Attends Religious Services: Not on file  . Active Member of Clubs or Organizations: Not on file  . Attends Archivist Meetings: Not on file  . Marital Status: Not on file  Intimate Partner Violence:   . Fear of Current or Ex-Partner: Not on file  . Emotionally Abused: Not on file  . Physically Abused: Not on file  . Sexually Abused: Not on file    Review of Systems: See HPI, otherwise negative ROS  Physical Exam: BP 139/84   Pulse 68   Temp (!) 96.9 F (36.1 C)   Ht 5\' 9"  (1.753 m)   Wt 146 lb 12.8 oz (66.6 kg)   BMI 21.68 kg/m  General:   Alert, quiet soft-spoken, pleasant and cooperative in NAD Neck:  Supple; no masses or thyromegaly. No significant cervical adenopathy. Lungs:  Clear throughout to auscultation.   No wheezes, crackles, or rhonchi. No acute distress. Heart:  Regular rate and rhythm; no murmurs, clicks, rubs,  or gallops. Abdomen: Non-distended, normal bowel sounds.   Soft and nontender without appreciable mass or hepatosplenomegaly.  Pulses:  Normal pulses noted. Extremities:  Without clubbing or edema.  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 He has been barely able to keep weight on.  He is on a good dose of pancreatic enzymes with concomitant acid suppression therapy.  Lomotil twice daily.  Questran 4 g daily taken separately from other medications. No evidence of superimposed infectious process on recent stool studies.  No medications to implicate. Esophageal dysphagia.   Recommendations: Check serum TSH  Continue Questran, Creon and Protonix at current dosing.  Please ensure you are not taking Questran within 2 hours of other medications.  May continue Carafate as needed.  Increase Lomotil to 2.5 mg 4 times a day  Keep appointment for EGD with esophageal dilation on December 13.  The risks, benefits, limitations, alternatives and imponderables have been reviewed with the patient. Potential for esophageal dilation, biopsy, etc. have also been reviewed.  Questions have been answered. All parties agreeable.  Office follow-up appointment here in 3 months  Will refer to [facility] get their input on weight loss and ongoing diarrhea aand weight loss after lenghty discussion and mutual agreement.  Further recommendations to follow.     Notice: This dictation was prepared with Dragon dictation along with smaller phrase technology. Any transcriptional errors that result from this process are unintentional and may not be corrected upon review.

## 2020-02-12 NOTE — Patient Instructions (Addendum)
Check serum TSH  Continue Questran, Creon and Protonix at current dosing.  Please ensure you are not taking Questran within 2 hours of other medications.  May continue Carafate as needed.  Increase Lomotil to 2.5 mg 4 times a day  Keep appointment for EGD with esophageal dilation on December 13  Office follow-up appointment here in 3 months  Will refer to [facility] get their input on weight loss and ongoing diarrhea  Further recommendations to follow.

## 2020-02-12 NOTE — Progress Notes (Signed)
Primary Care Physician:  [name], MD Primary Gastroenterologist:  Dr. [name]  Pre-Procedure History & Physical: HPI:  [name] is a 69 y.o. male here for follow-up of weight loss, diarrhea.  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 Having 6-8 bowel movements-watery loose throughout the day.  If he gets up at night to have a snack or to urinate, he has a bowel movement. He has gained 2 pounds since his last office visit. He is set up to have an EGD with esophageal dilation December 13 at 3 PM.  The timing of procedure so as not to  Alter his Lovenox regimen. I have reviewed the chart at length today.  No TSH since 2019.  Had most of his colon removed.  He has had an unknown potentially extensive amount of small bowel removed with multiple surgical procedures over the years.  I see no new medications to implicate in terms of diarrhea side effect. GIP and C. difficile negative over the summer. He is taking a multivitamin supplement daily. He tells me he has no difficulties with eating no early satiety, nausea or vomiting aside from esophageal dysphagia.[name]  He says he eats 3 meals daily with; snacks in between. Past Medical History:  Diagnosis Date  . 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 Anemia   . Anxiety   . Arthritis   . Barrett's esophagus   . Blood transfusion   . Bowel obstruction ([place]) 05/13/2012   Recurrent  . Bronchitis   . Chest pain at rest   . Chronic abdominal pain   . Erosive esophagitis   . ETOH abuse    quit 03/2010  . GERD (gastroesophageal reflux disease)   . Hx of Clostridium difficile infection 01/2012  . Hypertension   . Ileus ([place])   . Iron deficiency anemia 03/23/2016  . Obstruction of bowel ([place]) 03/03/14  . Osteoporosis   . Personal history of PE (pulmonary embolism) 10/01/2010  . Pneumonia   . Pulmonary embolism ([place]) 02/2010  . Recurrent upper respiratory infection (URI)   .  Renal disorder   . S/P endoscopy September 28, 2010   erosive reflux esophagitis, Billroth I anatomy  . 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 Seizures ([place])   . Shortness of breath   . TIA (transient ischemic attack) 10/11  . Vitamin B12 deficiency     Past Surgical History:  Procedure Laterality Date  . ABDOMINAL ADHESION SURGERY  03/04/15   @ [facility]  . ABDOMINAL EXPLORATION SURGERY    . abdominal sugery     for bowel obstruction x 8, all in 1980s, except for one in 07/2010  . APPENDECTOMY  1980s  . Billroth 1 hemigastrectomy  1980s   per patient for benign duodenal tumor  . CARDIAC CATHETERIZATION  07/17/2012  . CHOLECYSTECTOMY  1980s  . COLON SURGERY  May 2012   left hemicolectomy, colon cancer found at time of surgery for bowel obstruction  . COLON SURGERY  10/12/2018   At [facility]: Resection of right colon and terminal ileum with creation of ileorectal anastomosis for large bowel obstruction.  Cecum and ascending colon noted to be attached to the rectum.  . COLONOSCOPY  03/18/2011   anastomosis at 35cm. Several adenomatous polyps removed. Sigmoid diverticulosis. Next TCS 02/2013  . COLONOSCOPY N/A 07/24/2012   YNW:[id number] post segmental resection with normal-appearing colonic anastomosis aside from an adjacent polyp-removed as described above. Rectal polyp-removed as described above.  CT findings appear to have been artifactual. tubular adenomas/prolapsed type polyp.  . COLONOSCOPY N/A 05/15/2015   Procedure: COLONOSCOPY;  Surgeon: [name], MD;  [facility];  Service: Endoscopy;  Laterality: N/A;  . COLONOSCOPY  09/26/2018   at [facility]: prep not adequate for colon cancer surveillance.  Prior end-to-end colonic anastomosis in the rectosigmoid region.  This was patent and characterized by healthy-appearing mucosa.  Anastomosis was traversed.  Normal terminal ileum.  . COLONOSCOPY WITH PROPOFOL N/A 11/04/2016   Dr. [name], status post subtotal colectomy with normal-appearing residual lower  GI tract.  Next colonoscopy in 5 years.  . ESOPHAGOGASTRODUODENOSCOPY  09/28/2010  . ESOPHAGOGASTRODUODENOSCOPY  12/01/2010   Cervical web status post dilation, erosive esophagitis, B1 hemigastrectomy, inflamed anastomosis  . ESOPHAGOGASTRODUODENOSCOPY  04/16/2011   excoriation at GEJ c/w trauma/M-W tear, friable gastric anastomosis, dilation efferent limb  . ESOPHAGOGASTRODUODENOSCOPY N/A 06/03/2014   Dr.Lamberto Dinapoli- cervcal esopphageal web s/p dilation. abnormal distal esophagus bx= barretts esophagus  . ESOPHAGOGASTRODUODENOSCOPY N/A 05/15/2015   Procedure: ESOPHAGOGASTRODUODENOSCOPY (EGD);  Surgeon: [name], MD;  [facility];  Service: Endoscopy;  Laterality: N/A;  230  . ESOPHAGOGASTRODUODENOSCOPY (EGD) WITH ESOPHAGEAL DILATION  02/25/2012   [id number] esophageal web-s/p dilation anddisruption as described above. Status post prior gastric with Billroth I configuration. Abnormal gastric mucosa at the anastomosis. Gastric biopsy showed mild chronic inflammation but no H. pylori   . ESOPHAGOGASTRODUODENOSCOPY (EGD) WITH PROPOFOL N/A 11/04/2016   Dr. [name]: Reflux esophagitis, small hiatal hernia status post hemigastrectomy.  Single patent efferent small bowel limb appeared normal, 2 x 2 centimeter tongue of salmon epithelium again seen, esophageal dilation.  Biopsies consistent with reflux changes, not Barrett's however this was confirmed on prior EGDs.  Offer 3-year follow-up EGD August 2021.  [name] HERNIA REPAIR     right inguinal  . MALONEY DILATION N/A 06/03/2014   Procedure: [name] DILATION;  Surgeon: [name], MD;  [facility];  Service: Endoscopy;  Laterality: N/A;  [name] DILATION N/A 05/15/2015   Procedure: [name] DILATION;  Surgeon: [name], MD;  [facility];  Service: Endoscopy;  Laterality: N/A;  . [name] DILATION N/A 11/04/2016   Procedure: [name] DILATION;  Surgeon: [name], MD;  [facility];  Service: Endoscopy;   Laterality: N/A;  . PORTACATH PLACEMENT    . SAVORY DILATION N/A 06/03/2014   Procedure: SAVORY DILATION;  Surgeon: [name], MD;  [facility];  Service: Endoscopy;  Laterality: N/A;    Prior to Admission medications   Medication Sig Start Date End Date Taking? Authorizing Provider  ammonium lactate (AMLACTIN) 12 % cream See admin instructions. APPLY CREAM TO DRY SKIN ON LEGS ARMS HANDS AND BACK AREA ONCE TO TWICE DAILY (AVOID FACE GROIN UNDERARMS) 11/22/17  Yes [provider]  atorvastatin (LIPITOR) 20 MG tablet Take 20 mg by mouth at bedtime.    Yes [provider]  carvedilol (COREG) 25 MG tablet Take 0.5 tablets (12.5 mg total) by mouth 2 (two) times daily with a meal. Patient taking differently: Take 25 mg by mouth 2 (two) times daily with a meal.  05/06/19  Yes Emokpae, Courage, MD  Cholecalciferol (VITAMIN D) 2000 units CAPS Take 2,000 Units by mouth daily.   Yes [provider]  cholestyramine Lucrezia Starch) 4 GM/DOSE powder Dissolve 1/2 packet in water and take once daily Patient taking differently: Dissolve 1 packet in water and take once daily 01/23/20  Yes Gordy Levan,  Eric A, NP  cyclobenzaprine (FLEXERIL) 5 MG tablet TAKE 1 TABLET BY MOUTH ONCE DAILY AT BEDTIME AS NEEDED FOR 10 DAYS 11/07/19  Yes [provider]  diphenoxylate-atropine (LOMOTIL) 2.5-0.025 MG tablet Take 1 tablet by mouth 2 (two) times daily as needed for diarrhea or loose stools. 12/11/19  Yes Austen Oyster, Cristopher Estimable, MD  enoxaparin (LOVENOX) 60 MG/0.6ML injection  11/05/19  Yes [provider]  gabapentin (NEURONTIN) 100 MG capsule Take 1 capsule by mouth at bedtime 01/29/20  Yes Derek Jack, MD  lipase/protease/amylase (CREON) 36000 UNITS CPEP capsule Take 2 capsules with meals and 1 capsule with snacks. Patient taking differently: Take 3 capsules with meals and 2 capsule with snacks. 04/25/19  Yes Mahala Menghini, PA-C  mirtazapine (REMERON) 30 MG tablet Take 1 tablet  (30 mg total) by mouth at bedtime. 08/13/19  Yes Derek Jack, MD  Multiple Vitamin (MULTIVITAMIN WITH MINERALS) TABS tablet Take 1 tablet by mouth daily.   Yes [provider]  Nutritional Supplements (ENSURE PLUS HN) LIQD Take 1 Bottle daily by mouth.    Yes [provider]  ondansetron (ZOFRAN ODT) 4 MG disintegrating tablet 4mg  ODT q4 hours prn nausea/vomit 08/24/19  Yes Milton Ferguson, MD  pantoprazole (PROTONIX) 40 MG tablet Take 1 tablet (40 mg total) by mouth 2 (two) times daily before a meal. 04/25/19  Yes Mahala Menghini, PA-C  sucralfate (CARAFATE) 1 GM/10ML suspension Take 10 mLs (1 g total) by mouth 4 (four) times daily as needed. For breakthrough heartburn 04/25/19  Yes Mahala Menghini, PA-C  traMADol (ULTRAM) 50 MG tablet Take 50 mg by mouth daily as needed for moderate pain.  04/14/18  Yes [provider]  zoledronic acid (RECLAST) 5 MG/100ML SOLN injection Inject 5 mg into the vein See admin instructions. Pt gets a dose yearly. Due again 2018   Yes [provider]  loperamide (IMODIUM) 2 MG capsule Take 2-4 mg by mouth as needed.  Patient not taking: Reported on 02/12/2020 11/20/18   [provider]  gabapentin (NEURONTIN) 100 MG capsule Take 1 capsule by mouth at bedtime 12/19/18   Lockamy, Randi L, NP-C    Allergies as of 02/12/2020  . (No Known Allergies)    Family History  Problem Relation Age of Onset  . Hypertension Mother   . Arthritis Mother   . Pneumonia Mother   . Hypertension Father   . Heart attack Father   . Colon cancer Neg Hx     Social History   Socioeconomic History  . Marital status: Married    Spouse name: Not on file  . Number of children: 3  . Years of education: Not on file  . Highest education level: Not on file  Occupational History    Employer: [employer]  Tobacco Use  . Smoking status: Former Smoker    Packs/day: 0.50    Years: 40.00    Pack years: 20.00    Types: Cigarettes    Quit  date: 12/20/2012    Years since quitting: 7.1  . Smokeless tobacco: Never Used  Vaping Use  . Vaping Use: Never used  Substance and Sexual Activity  . Alcohol use: No  . Drug use: No  . Sexual activity: Never  Other Topics Concern  . Not on file  Social History Narrative  . Not on file   Social Determinants of Health   Financial Resource Strain:   . Difficulty of Paying Living Expenses: Not on file  Food  Insecurity:   . Worried About Charity fundraiser in the Last Year: Not on file  . Ran Out of Food in the Last Year: Not on file  Transportation Needs:   . Lack of Transportation (Medical): Not on file  . Lack of Transportation (Non-Medical): Not on file  Physical Activity:   . Days of Exercise per Week: Not on file  . Minutes of Exercise per Session: Not on file  Stress:   . Feeling of Stress : Not on file  Social Connections:   . Frequency of Communication with Friends and Family: Not on file  . Frequency of Social Gatherings with Friends and Family: Not on file  . Attends Religious Services: Not on file  . Active Member of Clubs or Organizations: Not on file  . Attends Archivist Meetings: Not on file  . Marital Status: Not on file  Intimate Partner Violence:   . Fear of Current or Ex-Partner: Not on file  . Emotionally Abused: Not on file  . Physically Abused: Not on file  . Sexually Abused: Not on file    Review of Systems: See HPI, otherwise negative ROS  Physical Exam: BP 139/84   Pulse 68   Temp (!) 96.9 F (36.1 C)   Ht 5\' 9"  (1.753 m)   Wt 146 lb 12.8 oz (66.6 kg)   BMI 21.68 kg/m  General:   Alert, quiet soft-spoken, pleasant and cooperative in NAD Neck:  Supple; no masses or thyromegaly. No significant cervical adenopathy. Lungs:  Clear throughout to auscultation.   No wheezes, crackles, or rhonchi. No acute distress. Heart:  Regular rate and rhythm; no murmurs, clicks, rubs,  or gallops. Abdomen: Non-distended, normal bowel sounds.   Soft and nontender without appreciable mass or hepatosplenomegaly.  Pulses:  Normal pulses noted. Extremities:  Without clubbing or edema.  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 He has been barely able to keep weight on.  He is on a good dose of pancreatic enzymes with concomitant acid suppression therapy.  Lomotil twice daily.  Questran 4 g daily taken separately from other medications. No evidence of superimposed infectious process on recent stool studies.  No medications to implicate. Esophageal dysphagia.   Recommendations: Check serum TSH  Continue Questran, Creon and Protonix at current dosing.  Please ensure you are not taking Questran within 2 hours of other medications.  May continue Carafate as needed.  Increase Lomotil to 2.5 mg 4 times a day  Keep appointment for EGD with esophageal dilation on December 13.  The risks, benefits, limitations, alternatives and imponderables have been reviewed with the patient. Potential for esophageal dilation, biopsy, etc. have also been reviewed.  Questions have been answered. All parties agreeable.  Office follow-up appointment here in 3 months  Will refer to [place] clinic get their input on weight loss and ongoing diarrhea aand weight loss after lenghty discussion and mutual agreement.  Further recommendations to follow.     Notice: This dictation was prepared with Dragon dictation along with smaller phrase technology. Any transcriptional errors that result from this process are unintentional and may not be corrected upon review.

## 2020-02-18 ENCOUNTER — Other Ambulatory Visit: Payer: Self-pay | Admitting: Internal Medicine

## 2020-02-18 ENCOUNTER — Other Ambulatory Visit: Payer: Self-pay

## 2020-02-18 DIAGNOSIS — R634 Abnormal weight loss: Secondary | ICD-10-CM

## 2020-02-18 DIAGNOSIS — R197 Diarrhea, unspecified: Secondary | ICD-10-CM

## 2020-02-25 NOTE — Patient Instructions (Signed)
[name]  02/25/2020     @PREFPERIOPPHARMACY @   Your procedure is scheduled on  03/03/2020.  Report to [facility] at  1330  (1:30)  P.M.  Call this number if you have problems the morning of surgery:  [phone]   Remember:  Follow the diet instructions given to you by the office.                       Take these medicines the morning of surgery with A SIP OF WATER carvedilol, gabapentin, zofran(if needed), protonix, tramadol.    Do not wear jewelry, make-up or nail polish.  Do not wear lotions, powders, or perfumes. Please wear deodorant and brush your teeth.  Do not shave 48 hours prior to surgery.  Men may shave face and neck.  Do not bring valuables to the hospital.  [facility] is not responsible for any belongings or valuables.  Contacts, dentures or bridgework may not be worn into surgery.  Leave your suitcase in the car.  After surgery it may be brought to your room.  For patients admitted to the hospital, discharge time will be determined by your treatment team.  Patients discharged the day of surgery will not be allowed to drive home.   Name and phone number of your driver:   family Special instructions:  DO NOT smoke the morning of your procedure.  Please read over the following fact sheets that you were given. Anesthesia Post-op Instructions and Care and Recovery After Surgery       Upper Endoscopy, Adult, Care After This sheet gives you information about how to care for yourself after your procedure. Your health care provider may also give you more specific instructions. If you have problems or questions, contact your health care provider. What can I expect after the procedure? After the procedure, it is common to have:  A sore throat.  Mild stomach pain or discomfort.  Bloating.  Nausea. Follow these instructions at home:   Follow instructions from your health care provider about what to eat or drink after your  procedure.  Return to your normal activities as told by your health care provider. Ask your health care provider what activities are safe for you.  Take over-the-counter and prescription medicines only as told by your health care provider.  Do not drive for 24 hours if you were given a sedative during your procedure.  Keep all follow-up visits as told by your health care provider. This is important. Contact a health care provider if you have:  A sore throat that lasts longer than one day.  Trouble swallowing. Get help right away if:  You vomit blood or your vomit looks like coffee grounds.  You have: ? A fever. ? Bloody, black, or tarry stools. ? A severe sore throat or you cannot swallow. ? Difficulty breathing. ? Severe pain in your chest or abdomen. Summary  After the procedure, it is common to have a sore throat, mild stomach discomfort, bloating, and nausea.  Do not drive for 24 hours if you were given a sedative during the procedure.  Follow instructions from your health care provider about what to eat or drink after your procedure.  Return to your normal activities as told by your health care provider. This information is not intended to replace advice given to you by your health care provider. Make sure you discuss any questions you have with your health care  provider. Document Revised: 08/30/2017 Document Reviewed: 08/08/2017 Elsevier Patient Education  [place].  Esophageal Dilatation Esophageal dilatation, also called esophageal dilation, is a procedure to widen or open (dilate) a blocked or narrowed part of the esophagus. The esophagus is the part of the body that moves food and liquid from the mouth to the stomach. You may need this procedure if:  You have a buildup of scar tissue in your esophagus that makes it difficult, painful, or impossible to swallow. This can be caused by gastroesophageal reflux disease (GERD).  You have cancer of the  esophagus.  There is a problem with how food moves through your esophagus. In some cases, you may need this procedure repeated at a later time to dilate the esophagus gradually. Tell a health care provider about:  Any allergies you have.  All medicines you are taking, including vitamins, herbs, eye drops, creams, and over-the-counter medicines.  Any problems you or family members have had with anesthetic medicines.  Any blood disorders you have.  Any surgeries you have had.  Any medical conditions you have.  Any antibiotic medicines you are required to take before dental procedures.  Whether you are pregnant or may be pregnant. What are the risks? Generally, this is a safe procedure. However, problems may occur, including:  Bleeding due to a tear in the lining of the esophagus.  A hole (perforation) in the esophagus. What happens before the procedure?  Follow instructions from your health care provider about eating or drinking restrictions.  Ask your health care provider about changing or stopping your regular medicines. This is especially important if you are taking diabetes medicines or blood thinners.  Plan to have someone take you home from the hospital or clinic.  Plan to have a responsible adult care for you for at least 24 hours after you leave the hospital or clinic. This is important. What happens during the procedure?  You may be given a medicine to help you relax (sedative).  A numbing medicine may be sprayed into the back of your throat, or you may gargle the medicine.  Your health care provider may perform the dilatation using various surgical instruments, such as: ? Simple dilators. This instrument is carefully placed in the esophagus to stretch it. ? Guided wire bougies. This involves using an endoscope to insert a wire into the esophagus. A dilator is passed over this wire to enlarge the esophagus. Then the wire is removed. ? Balloon dilators. An endoscope  with a small balloon at the end is inserted into the esophagus. The balloon is inflated to stretch the esophagus and open it up. The procedure may vary among health care providers and hospitals. What happens after the procedure?  Your blood pressure, heart rate, breathing rate, and blood oxygen level will be monitored until the medicines you were given have worn off.  Your throat may feel slightly sore and numb. This will improve slowly over time.  You will not be allowed to eat or drink until your throat is no longer numb.  When you are able to drink, urinate, and sit on the edge of the bed without nausea or dizziness, you may be able to return home. Follow these instructions at home:  Take over-the-counter and prescription medicines only as told by your health care provider.  Do not drive for 24 hours if you were given a sedative during your procedure.  You should have a responsible adult with you for 24 hours after the procedure.  Follow instructions from your health care provider about any eating or drinking restrictions.  Do not use any products that contain nicotine or tobacco, such as cigarettes and e-cigarettes. If you need help quitting, ask your health care provider.  Keep all follow-up visits as told by your health care provider. This is important. Get help right away if you:  Have a fever.  Have chest pain.  Have pain that is not relieved by medication.  Have trouble breathing.  Have trouble swallowing.  Vomit blood. Summary  Esophageal dilatation, also called esophageal dilation, is a procedure to widen or open (dilate) a blocked or narrowed part of the esophagus.  Plan to have someone take you home from the hospital or clinic.  For this procedure, a numbing medicine may be sprayed into the back of your throat, or you may gargle the medicine.  Do not drive for 24 hours if you were given a sedative during your procedure. This information is not intended to  replace advice given to you by your health care provider. Make sure you discuss any questions you have with your health care provider. Document Revised: 01/03/2019 Document Reviewed: 01/11/2017 Elsevier Patient Education  2020 [place] After These instructions provide you with information about caring for yourself after your procedure. Your health care provider may also give you more specific instructions. Your treatment has been planned according to current medical practices, but problems sometimes occur. Call your health care provider if you have any problems or questions after your procedure. What can I expect after the procedure? After your procedure, you may:  Feel sleepy for several hours.  Feel clumsy and have poor balance for several hours.  Feel forgetful about what happened after the procedure.  Have poor judgment for several hours.  Feel nauseous or vomit.  Have a sore throat if you had a breathing tube during the procedure. Follow these instructions at home: For at least 24 hours after the procedure:      Have a responsible adult stay with you. It is important to have someone help care for you until you are awake and alert.  Rest as needed.  Do not: ? Participate in activities in which you could fall or become injured. ? Drive. ? Use heavy machinery. ? Drink alcohol. ? Take sleeping pills or medicines that cause drowsiness. ? Make important decisions or sign legal documents. ? Take care of children on your own. Eating and drinking  Follow the diet that is recommended by your health care provider.  If you vomit, drink water, juice, or soup when you can drink without vomiting.  Make sure you have little or no nausea before eating solid foods. General instructions  Take over-the-counter and prescription medicines only as told by your health care provider.  If you have sleep apnea, surgery and certain medicines can increase  your risk for breathing problems. Follow instructions from your health care provider about wearing your sleep device: ? Anytime you are sleeping, including during daytime naps. ? While taking prescription pain medicines, sleeping medicines, or medicines that make you drowsy.  If you smoke, do not smoke without supervision.  Keep all follow-up visits as told by your health care provider. This is important. Contact a health care provider if:  You keep feeling nauseous or you keep vomiting.  You feel light-headed.  You develop a rash.  You have a fever. Get help right away if:  You have trouble breathing. Summary  For several  hours after your procedure, you may feel sleepy and have poor judgment.  Have a responsible adult stay with you for at least 24 hours or until you are awake and alert. This information is not intended to replace advice given to you by your health care provider. Make sure you discuss any questions you have with your health care provider. Document Revised: 06/06/2017 Document Reviewed: 06/29/2015 Elsevier Patient Education  [place].

## 2020-02-27 ENCOUNTER — Inpatient Hospital Stay (HOSPITAL_COMMUNITY): Payer: Medicare Other | Attending: Hematology

## 2020-02-27 ENCOUNTER — Other Ambulatory Visit: Payer: Self-pay

## 2020-02-27 DIAGNOSIS — Z85038 Personal history of other malignant neoplasm of large intestine: Secondary | ICD-10-CM | POA: Insufficient documentation

## 2020-02-27 DIAGNOSIS — Z452 Encounter for adjustment and management of vascular access device: Secondary | ICD-10-CM | POA: Diagnosis present

## 2020-02-27 NOTE — Progress Notes (Signed)
Patients port flushed without difficulty.  Good blood return noted with no bruising or swelling noted at site.  Band aid applied.  VSS with discharge and left in satisfactory condition with no s/s of distress noted.   

## 2020-02-28 ENCOUNTER — Other Ambulatory Visit: Payer: Self-pay

## 2020-02-28 ENCOUNTER — Encounter (HOSPITAL_COMMUNITY): Payer: Self-pay

## 2020-02-28 ENCOUNTER — Encounter (HOSPITAL_COMMUNITY)
Admission: RE | Admit: 2020-02-28 | Discharge: 2020-02-28 | Disposition: A | Discharge status: Home / Self Care | Payer: Medicare Other | Source: Ambulatory Visit | Attending: Internal Medicine | Admitting: Internal Medicine

## 2020-02-28 ENCOUNTER — Other Ambulatory Visit (HOSPITAL_COMMUNITY)
Admission: RE | Admit: 2020-02-28 | Discharge: 2020-02-28 | Disposition: A | Discharge status: Home / Self Care | Payer: Medicare Other | Source: Ambulatory Visit | Attending: Internal Medicine | Admitting: Internal Medicine

## 2020-02-28 DIAGNOSIS — Z20822 Contact with and (suspected) exposure to covid-19: Secondary | ICD-10-CM | POA: Diagnosis not present

## 2020-02-28 DIAGNOSIS — Z01812 Encounter for preprocedural laboratory examination: Secondary | ICD-10-CM | POA: Diagnosis present

## 2020-02-28 LAB — CBC WITH DIFFERENTIAL/PLATELET
Abs Immature Granulocytes: 0.01 10*3/uL (ref 0.00–0.07)
Basophils Absolute: 0 10*3/uL (ref 0.0–0.1)
Basophils Relative: 0 %
Eosinophils Absolute: 0.1 10*3/uL (ref 0.0–0.5)
Eosinophils Relative: 2 %
HCT: 41.1 % (ref 39.0–52.0)
Hemoglobin: 13.3 g/dL (ref 13.0–17.0)
Immature Granulocytes: 0 %
Lymphocytes Relative: 30 %
Lymphs Abs: 2.3 10*3/uL (ref 0.7–4.0)
MCH: 31.1 pg (ref 26.0–34.0)
MCHC: 32.4 g/dL (ref 30.0–36.0)
MCV: 96 fL (ref 80.0–100.0)
Monocytes Absolute: 0.8 10*3/uL (ref 0.1–1.0)
Monocytes Relative: 10 %
Neutro Abs: 4.4 10*3/uL (ref 1.7–7.7)
Neutrophils Relative %: 58 %
Platelets: 136 10*3/uL — ABNORMAL LOW (ref 150–400)
RBC: 4.28 MIL/uL (ref 4.22–5.81)
RDW: 13.9 % (ref 11.5–15.5)
WBC: 7.6 10*3/uL (ref 4.0–10.5)
nRBC: 0 % (ref 0.0–0.2)

## 2020-02-28 LAB — COMPREHENSIVE METABOLIC PANEL
ALT: 14 U/L (ref 0–44)
AST: 18 U/L (ref 15–41)
Albumin: 3.5 g/dL (ref 3.5–5.0)
Alkaline Phosphatase: 63 U/L (ref 38–126)
Anion gap: 9 (ref 5–15)
BUN: 7 mg/dL — ABNORMAL LOW (ref 8–23)
CO2: 21 mmol/L — ABNORMAL LOW (ref 22–32)
Calcium: 8.3 mg/dL — ABNORMAL LOW (ref 8.9–10.3)
Chloride: 108 mmol/L (ref 98–111)
Creatinine, Ser: 1.4 mg/dL — ABNORMAL HIGH (ref 0.61–1.24)
GFR, Estimated: 54 mL/min — ABNORMAL LOW (ref >60)
Glucose, Bld: 101 mg/dL — ABNORMAL HIGH (ref 70–99)
Potassium: 4.4 mmol/L (ref 3.5–5.1)
Sodium: 138 mmol/L (ref 135–145)
Total Bilirubin: 0.5 mg/dL (ref 0.3–1.2)
Total Protein: 6.3 g/dL — ABNORMAL LOW (ref 6.5–8.1)

## 2020-02-28 LAB — SARS CORONAVIRUS 2 (TAT 6-24 HRS): SARS Coronavirus 2: NEGATIVE

## 2020-02-28 LAB — TSH: TSH: 0.74 mIU/L (ref 0.40–4.50)

## 2020-03-03 ENCOUNTER — Encounter (HOSPITAL_COMMUNITY): Payer: Self-pay | Admitting: Internal Medicine

## 2020-03-03 ENCOUNTER — Ambulatory Visit (HOSPITAL_COMMUNITY): Payer: Medicare Other | Admitting: Anesthesiology

## 2020-03-03 ENCOUNTER — Ambulatory Visit (HOSPITAL_COMMUNITY)
Admission: RE | Admit: 2020-03-03 | Discharge: 2020-03-03 | Disposition: A | Discharge status: Home / Self Care | Payer: Medicare Other | Attending: Internal Medicine | Admitting: Internal Medicine

## 2020-03-03 ENCOUNTER — Encounter (HOSPITAL_COMMUNITY)
Admission: RE | Disposition: A | Discharge status: Home / Self Care | Payer: Self-pay | Source: Home / Self Care | Attending: Internal Medicine

## 2020-03-03 DIAGNOSIS — Z79899 Other long term (current) drug therapy: Secondary | ICD-10-CM | POA: Insufficient documentation

## 2020-03-03 DIAGNOSIS — Z86711 Personal history of pulmonary embolism: Secondary | ICD-10-CM | POA: Insufficient documentation

## 2020-03-03 DIAGNOSIS — Z903 Acquired absence of stomach [part of]: Secondary | ICD-10-CM | POA: Insufficient documentation

## 2020-03-03 DIAGNOSIS — R1310 Dysphagia, unspecified: Secondary | ICD-10-CM | POA: Diagnosis not present

## 2020-03-03 DIAGNOSIS — F1011 Alcohol abuse, in remission: Secondary | ICD-10-CM | POA: Diagnosis not present

## 2020-03-03 DIAGNOSIS — R634 Abnormal weight loss: Secondary | ICD-10-CM | POA: Diagnosis not present

## 2020-03-03 DIAGNOSIS — Z9049 Acquired absence of other specified parts of digestive tract: Secondary | ICD-10-CM | POA: Diagnosis not present

## 2020-03-03 DIAGNOSIS — Z85038 Personal history of other malignant neoplasm of large intestine: Secondary | ICD-10-CM | POA: Insufficient documentation

## 2020-03-03 DIAGNOSIS — K219 Gastro-esophageal reflux disease without esophagitis: Secondary | ICD-10-CM | POA: Insufficient documentation

## 2020-03-03 DIAGNOSIS — Z8673 Personal history of transient ischemic attack (TIA), and cerebral infarction without residual deficits: Secondary | ICD-10-CM | POA: Diagnosis not present

## 2020-03-03 DIAGNOSIS — R197 Diarrhea, unspecified: Secondary | ICD-10-CM | POA: Diagnosis not present

## 2020-03-03 DIAGNOSIS — Z7901 Long term (current) use of anticoagulants: Secondary | ICD-10-CM | POA: Insufficient documentation

## 2020-03-03 DIAGNOSIS — K8681 Exocrine pancreatic insufficiency: Secondary | ICD-10-CM | POA: Diagnosis not present

## 2020-03-03 DIAGNOSIS — R1314 Dysphagia, pharyngoesophageal phase: Secondary | ICD-10-CM | POA: Diagnosis present

## 2020-03-03 DIAGNOSIS — Z87891 Personal history of nicotine dependence: Secondary | ICD-10-CM | POA: Insufficient documentation

## 2020-03-03 HISTORY — PX: ESOPHAGOGASTRODUODENOSCOPY (EGD) WITH PROPOFOL: SHX5813

## 2020-03-03 HISTORY — PX: MALONEY DILATION: SHX5535

## 2020-03-03 SURGERY — ESOPHAGOGASTRODUODENOSCOPY (EGD) WITH PROPOFOL
Anesthesia: General

## 2020-03-03 MED ORDER — PROPOFOL 10 MG/ML IV BOLUS
INTRAVENOUS | Status: DC | PRN
  Administered 2020-03-03: 40 mg via INTRAVENOUS
  Administered 2020-03-03: 100 mg via INTRAVENOUS
  Administered 2020-03-03: 30 mg via INTRAVENOUS

## 2020-03-03 MED ORDER — GLYCOPYRROLATE 0.2 MG/ML IJ SOLN
INTRAMUSCULAR | Status: AC
  Filled 2020-03-03: qty 1

## 2020-03-03 MED ORDER — STERILE WATER FOR IRRIGATION IR SOLN
Status: DC | PRN
  Administered 2020-03-03: 500 mL

## 2020-03-03 MED ORDER — LIDOCAINE VISCOUS HCL 2 % MT SOLN
OROMUCOSAL | Status: AC
  Filled 2020-03-03: qty 15

## 2020-03-03 MED ORDER — LACTATED RINGERS IV SOLN: INTRAVENOUS | Status: DC

## 2020-03-03 MED ORDER — LACTATED RINGERS IV SOLN
INTRAVENOUS | Status: DC | PRN
  Administered 2020-03-03: 1000 mL via INTRAVENOUS

## 2020-03-03 MED ORDER — HEPARIN SOD (PORK) LOCK FLUSH 100 UNIT/ML IV SOLN
INTRAVENOUS | Status: AC
  Filled 2020-03-03: qty 5

## 2020-03-03 MED ORDER — CHLORHEXIDINE GLUCONATE CLOTH 2 % EX PADS: 6.0000 | MEDICATED_PAD | Freq: Once | CUTANEOUS | Status: DC

## 2020-03-03 MED ORDER — GLYCOPYRROLATE 0.2 MG/ML IJ SOLN
INTRAMUSCULAR | Status: DC | PRN
  Administered 2020-03-03: .2 mg via INTRAVENOUS

## 2020-03-03 MED ORDER — HEPARIN SOD (PORK) LOCK FLUSH 100 UNIT/ML IV SOLN
500.0000 [IU] | Freq: Once | INTRAVENOUS | Status: AC
  Administered 2020-03-03: 500 [IU] via INTRAVENOUS

## 2020-03-03 NOTE — Transfer of Care (Signed)
Immediate Anesthesia Transfer of Care Note  Patient: [name]  Procedure(s) Performed: ESOPHAGOGASTRODUODENOSCOPY (EGD) WITH PROPOFOL (N/A ) MALONEY DILATION (N/A )  Patient Location: PACU  Anesthesia Type:General  Level of Consciousness: awake, alert  and oriented  Airway & Oxygen Therapy: Patient Spontanous Breathing  Post-op Assessment: Report given to RN, Post -op Vital signs reviewed and stable and Patient moving all extremities X 4  Post vital signs: Reviewed and stable  Last Vitals:  Vitals Value Taken Time  BP 107/58 03/03/20 1420  Temp    Pulse 68 03/03/20 1422  Resp 17 03/03/20 1422  SpO2 96 % 03/03/20 1422  Vitals shown include unvalidated device data.  Last Pain:  Vitals:   03/03/20 1321  TempSrc: Oral         Complications: No complications documented.

## 2020-03-03 NOTE — Anesthesia Postprocedure Evaluation (Signed)
Anesthesia Post Note  Patient: [name]  Procedure(s) Performed: ESOPHAGOGASTRODUODENOSCOPY (EGD) WITH PROPOFOL (N/A ) MALONEY DILATION (N/A )  Patient location during evaluation: Phase II Anesthesia Type: General Level of consciousness: awake Pain management: pain level controlled Vital Signs Assessment: post-procedure vital signs reviewed and stable Respiratory status: spontaneous breathing Cardiovascular status: blood pressure returned to baseline Anesthetic complications: no   No complications documented.   Last Vitals:  Vitals:   03/03/20 1458 03/03/20 1500  BP: 139/82 132/84  Pulse: 63 63  Resp: 16 17  Temp:  36.6 C  SpO2: 99% 99%    Last Pain:  Vitals:   03/03/20 1500  TempSrc:   PainSc: 0-No pain                 Louann Sjogren

## 2020-03-03 NOTE — Interval H&P Note (Signed)
History and Physical Interval Note:  03/03/2020 1:47 PM  [name]  has presented today for surgery, with the diagnosis of dysphagia, gerd.  The various methods of treatment have been discussed with the patient and family. After consideration of risks, benefits and other options for treatment, the patient has consented to  Procedure(s) with comments: ESOPHAGOGASTRODUODENOSCOPY (EGD) WITH PROPOFOL (N/A) - 3:00pm MALONEY DILATION (N/A) as a surgical intervention.  The patient's history has been reviewed, patient examined, no change in status, stable for surgery.  I have reviewed the patient's chart and labs.  Questions were answered to the patient's satisfaction.     [name]  Diarrhea has improved significantly with increase Lomotil to 4 times a day and continuing Imodium.  In fact, states she is having only ordered 1 bowel movement daily with formed. TSH normal.  EGD with esophageal dilation as feasible/appropriate per plan.  The risks, benefits, limitations, alternatives and imponderables have been reviewed with the patient. Potential for esophageal dilation, biopsy, etc. have also been reviewed.   Last dose of Lovenox nearly 24 hours ago. Questions have been answered. All parties agreeable.

## 2020-03-03 NOTE — Anesthesia Preprocedure Evaluation (Signed)
Anesthesia Evaluation  Patient identified by MRN, date of birth, ID band Patient awake    Reviewed: Allergy & Precautions, H&P , NPO status , Patient's Chart, lab work & pertinent test results, reviewed documented beta blocker date and time   Airway Mallampati: II  TM Distance: >3 FB Neck ROM: full    Dental no notable dental hx. (+) Teeth Intact   Pulmonary shortness of breath, pneumonia, resolved, Recent URI , former smoker,    Pulmonary exam normal breath sounds clear to auscultation       Cardiovascular Exercise Tolerance: Good hypertension, negative cardio ROS   Rhythm:regular Rate:Normal     Neuro/Psych Seizures -,  PSYCHIATRIC DISORDERS Anxiety TIA   GI/Hepatic Neg liver ROS, PUD, GERD  Medicated,  Endo/Other  negative endocrine ROS  Renal/GU Renal disease  negative genitourinary   Musculoskeletal   Abdominal   Peds  Hematology  (+) Blood dyscrasia, anemia ,   Anesthesia Other Findings   Reproductive/Obstetrics negative OB ROS                             Anesthesia Physical Anesthesia Plan  ASA: III  Anesthesia Plan: General   Post-op Pain Management:    Induction:   PONV Risk Score and Plan: Propofol infusion  Airway Management Planned:   Additional Equipment:   Intra-op Plan:   Post-operative Plan:   Informed Consent: I have reviewed the patients History and Physical, chart, labs and discussed the procedure including the risks, benefits and alternatives for the proposed anesthesia with the patient or authorized representative who has indicated his/her understanding and acceptance.     Dental Advisory Given  Plan Discussed with: CRNA  Anesthesia Plan Comments:         Anesthesia Quick Evaluation

## 2020-03-03 NOTE — Anesthesia Procedure Notes (Signed)
Date/Time: 03/03/2020 2:11 PM Performed by: Orlie Dakin, CRNA Pre-anesthesia Checklist: Patient identified, Emergency Drugs available, Suction available and Patient being monitored Patient Re-evaluated:Patient Re-evaluated prior to induction Oxygen Delivery Method: Nasal cannula Induction Type: IV induction Placement Confirmation: positive ETCO2

## 2020-03-03 NOTE — Op Note (Signed)
[facility] Patient Name: [name] Procedure Date: 03/03/2020 1:05 PM MRN: [id number] Date of Birth: 05/21/1950 Attending MD: Norvel Richards , MD CSN: [id number] Age: 69 Admit Type: Outpatient Procedure:                Upper GI endoscopy Indications:              Dysphagia Providers:                Norvel Richards, MD, Lurline Del, RN, Aram Candela Referring MD:              Medicines:                Propofol per Anesthesia Complications:            No immediate complications. Estimated Blood Loss:     Estimated blood loss was minimal. Procedure:                After obtaining informed consent, the endoscope was                            passed under direct vision. Throughout the                            procedure, the patient's blood pressure, pulse, and                            oxygen saturations were monitored continuously. The                            Endoscope was introduced through the mouth, and                            advanced to the effferent small bowel limb Scope In: 2:08:24 PM Scope Out: 2:15:19 PM Total Procedure Duration: 0 hours 6 minutes 55 seconds  Findings:      Esophageal mucosa appeared normal except for two 1 cm "tongues" of       salmon-colored epithelium coming up above the GE junction. Please see       photos. Tubular esophagus patent throughout its course. Stomach       surgically altered with single effernet small bowel limb. Residual       stomach appeared normal patent as did effernt limb. The scope was       withdrawn. Dilation was performed with a Maloney dilator with no       resistance at 56 Fr. Dilation was performed with a Maloney dilator with       mild resistance at 29 Fr. The dilation site was examined following       endoscope reinsertion and showed no change. Estimated blood loss: none.       Finally, biopsies of the abnormal distal esophagus were taken for       histologic  study. Impression:               Normal esophagus status dilation and BX  Surgically altered stomach                           - Moderate Sedation:      Moderate (conscious) sedation was personally administered by an       anesthesia professional. The following parameters were monitored: oxygen       saturation, heart rate, blood pressure, respiratory rate, EKG, adequacy       of pulmonary ventilation, and response to care. Recommendation:           - Patient has a contact number available for                            emergencies. The signs and symptoms of potential                            delayed complications were discussed with the                            patient. Return to normal activities tomorrow.                            Written discharge instructions were provided to the                            patient.                           - Advance diet as tolerated. F/U office 2 months.                            [facility] recommended eval for microscopic colitis and                            possible SIBO. Interestingly, pt say diarrhea has                            nearly resolved on 4 Lomotil dailuy.                           \ Procedure Code(s):        --- Professional ---                           [phone], Esophagogastroduodenoscopy, flexible,                            transoral; diagnostic, including collection of                            specimen(s) by brushing or washing, when performed                            (separate procedure)                           54008, Dilation of esophagus, by unguided sound  or                            bougie, single or multiple passes Diagnosis Code(s):        --- Professional ---                           R13.10, Dysphagia, unspecified CPT copyright 2019 [employer]. All rights reserved. The codes documented in this report are preliminary and upon coder review may  be revised to meet  current compliance requirements. Cristopher Estimable. Alfonzo Arca, MD Norvel Richards, MD 03/03/2020 2:35:06 PM This report has been signed electronically. Number of Addenda: 0

## 2020-03-03 NOTE — Discharge Instructions (Signed)
EGD Discharge instructions Please read the instructions outlined below and refer to this sheet in the next few weeks. These discharge instructions provide you with general information on caring for yourself after you leave the hospital. Your doctor may also give you specific instructions. While your treatment has been planned according to the most current medical practices available, unavoidable complications occasionally occur. If you have any problems or questions after discharge, please call your doctor. ACTIVITY  You may resume your regular activity but move at a slower pace for the next 24 hours.   Take frequent rest periods for the next 24 hours.   Walking will help expel (get rid of) the air and reduce the bloated feeling in your abdomen.   No driving for 24 hours (because of the anesthesia (medicine) used during the test).   You may shower.   Do not sign any important legal documents or operate any machinery for 24 hours (because of the anesthesia used during the test).  NUTRITION  Drink plenty of fluids.   You may resume your normal diet.   Begin with a light meal and progress to your normal diet.   Avoid alcoholic beverages for 24 hours or as instructed by your caregiver.  MEDICATIONS  You may resume your normal medications unless your caregiver tells you otherwise.  WHAT YOU CAN EXPECT TODAY  You may experience abdominal discomfort such as a feeling of fullness or "gas" pains.  FOLLOW-UP  Your doctor will discuss the results of your test with you.  SEEK IMMEDIATE MEDICAL ATTENTION IF ANY OF THE FOLLOWING OCCUR:  Excessive nausea (feeling sick to your stomach) and/or vomiting.   Severe abdominal pain and distention (swelling).   Trouble swallowing.   Temperature over 101 F (37.8 C).   Rectal bleeding or vomiting of blood.    Your esophagus was dilated today.  Everything in your upper GI tract looked okay.  There was a question of Barrett's esophagus again  today.  A couple of biopsies were taken.  Continue your present medical regimen  [facility] reviewed your records and made a couple of suggestions for evaluation of your diarrhea.  Before sending you over there we will see you back in the office and see how your diarrhea is before doing more testing.  I am glad to see diarrhea is much improved on increasing doses of Lomotil and Imodium  Office visit with Neil Crouch in 2 months  Further recommendations to follow pending review of pathology report  At patient request, I called Chapman Fitch at [phone] -left voicemail with information.  Continue Lovenox this evening without interruption      Monitored Anesthesia Care, Care After These instructions provide you with information about caring for yourself after your procedure. Your health care provider may also give you more specific instructions. Your treatment has been planned according to current medical practices, but problems sometimes occur. Call your health care provider if you have any problems or questions after your procedure. What can I expect after the procedure? After your procedure, you may:  Feel sleepy for several hours.  Feel clumsy and have poor balance for several hours.  Feel forgetful about what happened after the procedure.  Have poor judgment for several hours.  Feel nauseous or vomit.  Have a sore throat if you had a breathing tube during the procedure. Follow these instructions at home: For at least 24 hours after the procedure:      Have a responsible adult stay with you. It  is important to have someone help care for you until you are awake and alert.  Rest as needed.  Do not: ? Participate in activities in which you could fall or become injured. ? Drive. ? Use heavy machinery. ? Drink alcohol. ? Take sleeping pills or medicines that cause drowsiness. ? Make important decisions or sign legal documents. ? Take care of children on your  own. Eating and drinking  Follow the diet that is recommended by your health care provider.  If you vomit, drink water, juice, or soup when you can drink without vomiting.  Make sure you have little or no nausea before eating solid foods. General instructions  Take over-the-counter and prescription medicines only as told by your health care provider.  If you have sleep apnea, surgery and certain medicines can increase your risk for breathing problems. Follow instructions from your health care provider about wearing your sleep device: ? Anytime you are sleeping, including during daytime naps. ? While taking prescription pain medicines, sleeping medicines, or medicines that make you drowsy.  If you smoke, do not smoke without supervision.  Keep all follow-up visits as told by your health care provider. This is important. Contact a health care provider if:  You keep feeling nauseous or you keep vomiting.  You feel light-headed.  You develop a rash.  You have a fever. Get help right away if:  You have trouble breathing. Summary  For several hours after your procedure, you may feel sleepy and have poor judgment.  Have a responsible adult stay with you for at least 24 hours or until you are awake and alert. This information is not intended to replace advice given to you by your health care provider. Make sure you discuss any questions you have with your health care provider. Document Revised: 06/06/2017 Document Reviewed: 06/29/2015 Elsevier Patient Education  [place].

## 2020-03-05 ENCOUNTER — Encounter: Payer: Self-pay | Admitting: Internal Medicine

## 2020-03-05 ENCOUNTER — Other Ambulatory Visit (HOSPITAL_COMMUNITY): Payer: Self-pay | Admitting: Hematology

## 2020-03-05 DIAGNOSIS — C189 Malignant neoplasm of colon, unspecified: Secondary | ICD-10-CM

## 2020-03-05 DIAGNOSIS — R630 Anorexia: Secondary | ICD-10-CM

## 2020-03-05 LAB — SURGICAL PATHOLOGY

## 2020-03-10 ENCOUNTER — Encounter (HOSPITAL_COMMUNITY): Payer: Self-pay | Admitting: Internal Medicine

## 2020-03-17 ENCOUNTER — Other Ambulatory Visit: Payer: Self-pay | Admitting: Gastroenterology

## 2020-03-17 MED ORDER — DIPHENOXYLATE-ATROPINE 2.5-0.025 MG PO TABS: 1.0000 | ORAL_TABLET | Freq: Four times a day (QID) | ORAL | 3 refills | Status: DC

## 2020-03-20 ENCOUNTER — Other Ambulatory Visit: Payer: Self-pay | Admitting: Gastroenterology

## 2020-04-08 ENCOUNTER — Other Ambulatory Visit (HOSPITAL_COMMUNITY): Payer: Self-pay

## 2020-04-08 DIAGNOSIS — C189 Malignant neoplasm of colon, unspecified: Secondary | ICD-10-CM

## 2020-04-08 DIAGNOSIS — R630 Anorexia: Secondary | ICD-10-CM

## 2020-04-09 MED ORDER — MIRTAZAPINE 30 MG PO TABS: 30.0000 mg | ORAL_TABLET | Freq: Every day | ORAL | 2 refills | Status: DC

## 2020-04-18 ENCOUNTER — Emergency Department (HOSPITAL_COMMUNITY)
Admission: EM | Admit: 2020-04-18 | Discharge: 2020-04-18 | Disposition: A | Discharge status: Home / Self Care | Payer: Medicare Other | Attending: Emergency Medicine | Admitting: Emergency Medicine

## 2020-04-18 ENCOUNTER — Encounter (HOSPITAL_COMMUNITY): Payer: Self-pay | Admitting: *Deleted

## 2020-04-18 ENCOUNTER — Other Ambulatory Visit: Payer: Self-pay

## 2020-04-18 ENCOUNTER — Emergency Department (HOSPITAL_COMMUNITY): Payer: Medicare Other

## 2020-04-18 DIAGNOSIS — Z8673 Personal history of transient ischemic attack (TIA), and cerebral infarction without residual deficits: Secondary | ICD-10-CM | POA: Diagnosis not present

## 2020-04-18 DIAGNOSIS — Z87891 Personal history of nicotine dependence: Secondary | ICD-10-CM | POA: Diagnosis not present

## 2020-04-18 DIAGNOSIS — R0602 Shortness of breath: Secondary | ICD-10-CM | POA: Diagnosis not present

## 2020-04-18 DIAGNOSIS — I130 Hypertensive heart and chronic kidney disease with heart failure and stage 1 through stage 4 chronic kidney disease, or unspecified chronic kidney disease: Secondary | ICD-10-CM | POA: Diagnosis not present

## 2020-04-18 DIAGNOSIS — Z86711 Personal history of pulmonary embolism: Secondary | ICD-10-CM | POA: Insufficient documentation

## 2020-04-18 DIAGNOSIS — Z79899 Other long term (current) drug therapy: Secondary | ICD-10-CM | POA: Diagnosis not present

## 2020-04-18 DIAGNOSIS — I5032 Chronic diastolic (congestive) heart failure: Secondary | ICD-10-CM | POA: Diagnosis not present

## 2020-04-18 DIAGNOSIS — R079 Chest pain, unspecified: Secondary | ICD-10-CM | POA: Diagnosis present

## 2020-04-18 DIAGNOSIS — R0789 Other chest pain: Secondary | ICD-10-CM | POA: Insufficient documentation

## 2020-04-18 DIAGNOSIS — Z85038 Personal history of other malignant neoplasm of large intestine: Secondary | ICD-10-CM | POA: Diagnosis not present

## 2020-04-18 DIAGNOSIS — N183 Chronic kidney disease, stage 3 unspecified: Secondary | ICD-10-CM | POA: Insufficient documentation

## 2020-04-18 LAB — CBC WITH DIFFERENTIAL/PLATELET
Abs Immature Granulocytes: 0.02 10*3/uL (ref 0.00–0.07)
Basophils Absolute: 0 10*3/uL (ref 0.0–0.1)
Basophils Relative: 0 %
Eosinophils Absolute: 0.1 10*3/uL (ref 0.0–0.5)
Eosinophils Relative: 1 %
HCT: 47.4 % (ref 39.0–52.0)
Hemoglobin: 15.2 g/dL (ref 13.0–17.0)
Immature Granulocytes: 0 %
Lymphocytes Relative: 25 %
Lymphs Abs: 2.1 10*3/uL (ref 0.7–4.0)
MCH: 30.8 pg (ref 26.0–34.0)
MCHC: 32.1 g/dL (ref 30.0–36.0)
MCV: 96.1 fL (ref 80.0–100.0)
Monocytes Absolute: 0.7 10*3/uL (ref 0.1–1.0)
Monocytes Relative: 8 %
Neutro Abs: 5.6 10*3/uL (ref 1.7–7.7)
Neutrophils Relative %: 66 %
Platelets: 151 10*3/uL (ref 150–400)
RBC: 4.93 MIL/uL (ref 4.22–5.81)
RDW: 13.5 % (ref 11.5–15.5)
WBC: 8.5 10*3/uL (ref 4.0–10.5)
nRBC: 0 % (ref 0.0–0.2)

## 2020-04-18 LAB — BASIC METABOLIC PANEL
Anion gap: 5 (ref 5–15)
BUN: 10 mg/dL (ref 8–23)
CO2: 22 mmol/L (ref 22–32)
Calcium: 8.7 mg/dL — ABNORMAL LOW (ref 8.9–10.3)
Chloride: 108 mmol/L (ref 98–111)
Creatinine, Ser: 1.5 mg/dL — ABNORMAL HIGH (ref 0.61–1.24)
GFR, Estimated: 50 mL/min — ABNORMAL LOW (ref >60)
Glucose, Bld: 105 mg/dL — ABNORMAL HIGH (ref 70–99)
Potassium: 5.1 mmol/L (ref 3.5–5.1)
Sodium: 135 mmol/L (ref 135–145)

## 2020-04-18 LAB — TROPONIN I (HIGH SENSITIVITY)
Troponin I (High Sensitivity): 3 ng/L (ref <18)
Troponin I (High Sensitivity): 2 ng/L (ref <18)

## 2020-04-18 NOTE — ED Provider Notes (Signed)
[facility] Provider Note   CSN: [id number] Arrival date & time: 04/18/20  1206     History Chief Complaint  Patient presents with  . Chest Pain    [name] is a 70 y.o. male.  Patient states that he had about 45 minutes of chest pain shortness of breath while he was driving.  Patient does not have a history of coronary disease.  It was not pleuritic.  Patient does not have pain now  The history is provided by the patient and medical records. No language interpreter was used.  Chest Pain Pain location:  L chest Pain quality: aching   Pain radiates to:  Does not radiate Pain severity:  Moderate Onset quality:  Sudden Timing:  Intermittent Chronicity:  New Context: at rest   Relieved by:  Nothing Worsened by:  Nothing Associated symptoms: no abdominal pain, no back pain, no cough, no fatigue and no headache        Past Medical History:  Diagnosis Date  . 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 Anemia   . Anxiety   . Arthritis   . Barrett's esophagus   . Blood transfusion   . Bowel obstruction ([place]) 05/13/2012   Recurrent  . Bronchitis   . Chest pain at rest   . Chronic abdominal pain   . Erosive esophagitis   . ETOH abuse    quit 03/2010  . GERD (gastroesophageal reflux disease)   . Hx of Clostridium difficile infection 01/2012  . Hypertension   . Ileus ([place])   . Iron deficiency anemia 03/23/2016  . Obstruction of bowel ([place]) 03/03/14  . Osteoporosis   . Personal history of PE (pulmonary embolism) 10/01/2010  . Pneumonia   . Pulmonary embolism ([place]) 02/2010  . Recurrent upper respiratory infection (URI)   . Renal disorder   . S/P endoscopy September 28, 2010   erosive reflux esophagitis, Billroth I anatomy  . 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 Seizures ([place])    was on meds for 6 months, Unknown etiology, last seizure was 2017.  [name] Shortness of breath   . TIA (transient ischemic attack) 10/11  . Vitamin B12  deficiency     Patient Active Problem List   Diagnosis Date Noted  . Loss of weight 09/12/2019  . Pancreatic insufficiency 05/03/2019  . Dyslipidemia 05/03/2019  . B12 deficiency 05/03/2019  . Anxiety 05/03/2019  . Early satiety 04/25/2019  . Seizure disorder ([place]) 09/16/2016  . CKD (chronic kidney disease) stage 3, GFR 30-59 ml/min (HCC) 06/14/2016  . Patient has nasogastric tube   . Ileus ([place]) 05/15/2016  . Chronic diastolic heart failure ([place]) 05/15/2016  . Iron deficiency anemia 03/23/2016  . Acute renal failure ([place]) 03/19/2016  . Normocytic anemia 03/19/2016  . Hypotension 03/19/2016  . Anemia   . History of colon cancer, stage III   . Hx of colon cancer, stage III   . History of colonic polyps   . Barrett's esophagus without dysplasia   . Dysphagia   . Essential hypertension 11/30/2014  . Dysphagia, pharyngoesophageal phase   . Mucosal abnormality of esophagus   . SBO (small bowel obstruction) ([place]) 09/17/2013  . Rectal bleeding 07/11/2012  . Bowel obstruction ([place]) 07/11/2012  . Abnormal CT scan, colon 07/11/2012  . Esophageal dysphagia 02/22/2012  . H/O Clostridium difficile infection 02/22/2012  . Diarrhea 09/10/2011  . Cellulitis 09/10/2011  . Nausea 06/07/2011  . Abdominal distention 06/07/2011  .  HTN (hypertension) 06/07/2011  . Partial small bowel obstruction ([place]) 05/31/2011  . GI bleed 05/27/2011  . Coagulopathy ([place]) 05/27/2011  . Gastroenteritis 05/27/2011  . Small bowel obstruction ([place]) 05/13/2011  . LUQ pain 05/13/2011  . Pulmonary embolism ([place]) 01/17/2011  . Chest pain at rest 01/17/2011  . Pneumonia 12/11/2010  . Hematemesis 12/05/2010  . Chronic abdominal pain 12/05/2010  . Pancytopenia due to antineoplastic chemotherapy ([place]) 12/05/2010  . Coffee ground emesis 11/29/2010  . Esophageal reflux disease 11/15/2010  . 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 Personal history of PE (pulmonary embolism) 10/01/2010  . Bronchitis, acute  10/01/2010  . Erosive esophagitis 10/01/2010  . Fever chills 09/29/2010  . Bleeding gastrointestinal 09/26/2010  . Supratherapeutic INR 09/26/2010  . Abdominal pain 09/26/2010  . Nausea and vomiting 09/26/2010  . Anemia, chronic disease 09/26/2010  . Adenocarcinoma of colon with mucinous features 09/22/2010    Past Surgical History:  Procedure Laterality Date  . ABDOMINAL ADHESION SURGERY  03/04/15   @ [facility]  . ABDOMINAL EXPLORATION SURGERY    . abdominal sugery     for bowel obstruction x 8, all in 1980s, except for one in 07/2010  . APPENDECTOMY  1980s  . Billroth 1 hemigastrectomy  1980s   per patient for benign duodenal tumor  . CARDIAC CATHETERIZATION  07/17/2012  . CHOLECYSTECTOMY  1980s  . COLON SURGERY  May 2012   left hemicolectomy, colon cancer found at time of surgery for bowel obstruction  . COLON SURGERY  10/12/2018   At [facility]: Resection of right colon and terminal ileum with creation of ileorectal anastomosis for large bowel obstruction.  Cecum and ascending colon noted to be attached to the rectum.  . COLONOSCOPY  03/18/2011   anastomosis at 35cm. Several adenomatous polyps removed. Sigmoid diverticulosis. Next TCS 02/2013  . COLONOSCOPY N/A 07/24/2012   TJQ:[id number] post segmental resection with normal-appearing colonic anastomosis aside from an adjacent polyp-removed as described above. Rectal polyp-removed as described above. CT findings appear to have been artifactual. tubular adenomas/prolapsed type polyp.  . COLONOSCOPY N/A 05/15/2015   Procedure: COLONOSCOPY;  Surgeon: [name], MD;  [facility];  Service: Endoscopy;  Laterality: N/A;  . COLONOSCOPY  09/26/2018   at [facility]: prep not adequate for colon cancer surveillance.  Prior end-to-end colonic anastomosis in the rectosigmoid region.  This was patent and characterized by healthy-appearing mucosa.  Anastomosis was traversed.  Normal terminal ileum.  . COLONOSCOPY WITH PROPOFOL N/A 11/04/2016   Dr.  [name], status post subtotal colectomy with normal-appearing residual lower GI tract.  Next colonoscopy in 5 years.  . ESOPHAGOGASTRODUODENOSCOPY  09/28/2010  . ESOPHAGOGASTRODUODENOSCOPY  12/01/2010   Cervical web status post dilation, erosive esophagitis, B1 hemigastrectomy, inflamed anastomosis  . ESOPHAGOGASTRODUODENOSCOPY  04/16/2011   excoriation at [facility] c/w trauma/M-W tear, friable gastric anastomosis, dilation efferent limb  . ESOPHAGOGASTRODUODENOSCOPY N/A 06/03/2014   Dr.[name]- cervcal esopphageal web s/p dilation. abnormal distal esophagus bx= barretts esophagus  . ESOPHAGOGASTRODUODENOSCOPY N/A 05/15/2015   Procedure: ESOPHAGOGASTRODUODENOSCOPY (EGD);  Surgeon: [name], MD;  [facility];  Service: Endoscopy;  Laterality: N/A;  230  . ESOPHAGOGASTRODUODENOSCOPY (EGD) WITH ESOPHAGEAL DILATION  02/25/2012   RAQ:[id number] esophageal web-s/p dilation anddisruption as described above. Status post prior gastric with Billroth I configuration. Abnormal gastric mucosa at the anastomosis. Gastric biopsy showed mild chronic inflammation but no H. pylori   . ESOPHAGOGASTRODUODENOSCOPY (EGD) WITH PROPOFOL N/A 11/04/2016   Dr. [name]: Reflux esophagitis, small hiatal hernia status post hemigastrectomy.  Single patent efferent small bowel limb appeared normal, 2 x 2 centimeter tongue of salmon epithelium again seen, esophageal dilation.  Biopsies consistent with reflux changes, not Barrett's however this was confirmed on prior EGDs.  Offer 3-year follow-up EGD August 2021.  . ESOPHAGOGASTRODUODENOSCOPY (EGD) WITH PROPOFOL N/A 03/03/2020   Procedure: ESOPHAGOGASTRODUODENOSCOPY (EGD) WITH PROPOFOL;  Surgeon: [name], MD;  [facility];  Service: Endoscopy;  Laterality: N/A;  3:00pm  . HERNIA REPAIR     right inguinal  . MALONEY DILATION N/A 06/03/2014   Procedure: [name] DILATION;  Surgeon: [name], MD;  [facility];  Service: Endoscopy;  Laterality: N/A;  [name] DILATION N/A 05/15/2015   Procedure: [name] DILATION;  Surgeon: [name], MD;  [facility];  Service: Endoscopy;  Laterality: N/A;  . [name] DILATION N/A 11/04/2016   Procedure: [name] DILATION;  Surgeon: [name], MD;  [facility];  Service: Endoscopy;  Laterality: N/A;  . [name] DILATION N/A 03/03/2020   Procedure: [name] DILATION;  Surgeon: [name], MD;  [facility];  Service: Endoscopy;  Laterality: N/A;  . PORTACATH PLACEMENT    . SAVORY DILATION N/A 06/03/2014   Procedure: SAVORY DILATION;  Surgeon: [name], MD;  [facility];  Service: Endoscopy;  Laterality: N/A;       Family History  Problem Relation Age of Onset  . Hypertension Mother   . Arthritis Mother   . Pneumonia Mother   . Hypertension Father   . Heart attack Father   . Colon cancer Neg Hx     Social History   Tobacco Use  . Smoking status: Former Smoker    Packs/day: 0.50    Years: 40.00    Pack years: 20.00    Types: Cigarettes    Quit date: 12/20/2012    Years since quitting: 7.3  . Smokeless tobacco: Never Used  Vaping Use  . Vaping Use: Never used  Substance Use Topics  . Alcohol use: No  . Drug use: No    Home Medications Prior to Admission medications   Medication Sig Start Date End Date Taking? Authorizing Provider  mirtazapine (REMERON) 30 MG tablet Take 1 tablet (30 mg total) by mouth at bedtime. 04/09/20   Derek Jack, MD  ammonium lactate (AMLACTIN) 12 % cream Apply 1 g topically See admin instructions. APPLY CREAM TO DRY SKIN ON LEGS ARMS HANDS AND BACK AREA ONCE TO TWICE DAILY (AVOID FACE GROIN UNDERARMS) 11/22/17   [provider]  atorvastatin (LIPITOR) 20 MG tablet Take 20 mg by mouth at bedtime.     [provider]  carvedilol (COREG) 25 MG tablet Take 0.5 tablets (12.5 mg total) by mouth 2 (two) times daily with a meal. Patient taking differently: Take 25 mg by mouth 2 (two)  times daily with a meal. 05/06/19   Emokpae, Courage, MD  Cholecalciferol (VITAMIN D) 2000 units CAPS Take 2,000 Units by mouth daily.    [provider]  cholestyramine Lucrezia Starch) 4 GM/DOSE powder Dissolve 1/2 packet in water and take once daily Patient taking differently: Take 4 g by mouth daily. Dissolve 1 packet in water and take once daily 01/23/20   Carlis Stable, NP  diphenoxylate-atropine (LOMOTIL) 2.5-0.025 MG tablet Take 1 tablet by mouth 4 (four) times daily. 03/17/20   Mahala Menghini, PA-C  enoxaparin (LOVENOX) 60 MG/0.6ML injection Inject 60 mg into the skin daily.  [provider]  gabapentin (NEURONTIN) 100 MG capsule Take 1 capsule by mouth at bedtime Patient taking differently: Take 100 mg by mouth at bedtime. 01/29/20   Derek Jack, MD  lipase/protease/amylase (CREON) 36000 UNITS CPEP capsule Take 2 capsules with meals and 1 capsule with snacks. Patient taking differently: Take 3 capsules with meals and 2 capsule with snacks. 04/25/19   Mahala Menghini, PA-C  Multiple Vitamin (MULTIVITAMIN WITH MINERALS) TABS tablet Take 1 tablet by mouth daily.    [provider]  Multiple Vitamins-Minerals (OCUVITE EYE HEALTH FORMULA PO) Take 1 tablet by mouth daily.    [provider]  Nutritional Supplements (ENSURE PLUS HN) LIQD Take 1 Bottle daily by mouth.     [provider]  ondansetron (ZOFRAN ODT) 4 MG disintegrating tablet 4mg  ODT q4 hours prn nausea/vomit Patient taking differently: Take 4 mg by mouth every 4 (four) hours as needed for nausea or vomiting. 4mg  ODT q4 hours prn nausea/vomit 08/24/19   Milton Ferguson, MD  pantoprazole (PROTONIX) 40 MG tablet TAKE 1 TABLET BY MOUTH TWICE DAILY BEFORE  A  MEAL 03/20/20   Carlis Stable, NP  sucralfate (CARAFATE) 1 GM/10ML suspension Take 10 mLs (1 g total) by mouth 4 (four) times daily as needed. For breakthrough heartburn 04/25/19   Mahala Menghini, PA-C  traMADol (ULTRAM) 50 MG tablet Take 50  mg by mouth daily as needed for moderate pain.  04/14/18   [provider]  traMADol Veatrice Bourbon) 50 MG tablet Take by mouth. 02/03/20 05/03/20  [provider]  zoledronic acid (RECLAST) 5 MG/100ML SOLN injection Inject 5 mg into the vein See admin instructions. Pt gets a dose yearly. Due again 2018    [provider]    Allergies    Patient has no known allergies.  Review of Systems   Review of Systems  Constitutional: Negative for appetite change and fatigue.  HENT: Negative for congestion, ear discharge and sinus pressure.   Eyes: Negative for discharge.  Respiratory: Negative for cough.   Cardiovascular: Positive for chest pain.  Gastrointestinal: Negative for abdominal pain and diarrhea.  Genitourinary: Negative for frequency and hematuria.  Musculoskeletal: Negative for back pain.  Skin: Negative for rash.  Neurological: Negative for seizures and headaches.  Psychiatric/Behavioral: Negative for hallucinations.    Physical Exam Updated Vital Signs BP (!) 144/86   Pulse (!) 58   Temp (!) 97.5 F (36.4 C) (Oral)   Resp 15   Ht 5\' 9"  (1.753 m)   Wt 65.8 kg   SpO2 97%   BMI 21.41 kg/m   Physical Exam Vitals and nursing note reviewed.  Constitutional:      Appearance: He is well-developed.  HENT:     Head: Normocephalic.     Nose: Nose normal.  Eyes:     General: No scleral icterus.    Extraocular Movements: EOM normal.     Conjunctiva/sclera: Conjunctivae normal.  Neck:     Thyroid: No thyromegaly.  Cardiovascular:     Rate and Rhythm: Normal rate and regular rhythm.     Heart sounds: No murmur heard. No friction rub. No gallop.   Pulmonary:     Breath sounds: No stridor. No wheezing or rales.  Chest:     Chest wall: No tenderness.  Abdominal:     General: There is no distension.     Tenderness: There is no abdominal tenderness. There is no rebound.  Musculoskeletal:        General:  No edema. Normal range of motion.     Cervical  back: Neck supple.  Lymphadenopathy:     Cervical: No cervical adenopathy.  Skin:    Findings: No erythema or rash.  Neurological:     Mental Status: He is alert and oriented to person, place, and time.     Motor: No abnormal muscle tone.     Coordination: Coordination normal.  Psychiatric:        Mood and Affect: Mood and affect normal.        Behavior: Behavior normal.     ED Results / Procedures / Treatments   Labs (all labs ordered are listed, but only abnormal results are displayed) Labs Reviewed  BASIC METABOLIC PANEL - Abnormal; Notable for the following components:      Result Value   Glucose, Bld 105 (*)    Creatinine, Ser 1.50 (*)    Calcium 8.7 (*)    GFR, Estimated 50 (*)    All other components within normal limits  CBC WITH DIFFERENTIAL/PLATELET  TROPONIN I (HIGH SENSITIVITY)  TROPONIN I (HIGH SENSITIVITY)    EKG EKG Interpretation  Date/Time:  Friday April 18 2020 12:12:21 EST Ventricular Rate:  70 PR Interval:  136 QRS Duration: 90 QT Interval:  372 QTC Calculation: 401 R Axis:   1 Text Interpretation: Normal sinus rhythm similar to June 2021 Confirmed by Sherwood Gambler [phone]) on 04/18/2020 2:54:30 PM Also confirmed by Milton Ferguson [id number])  on 04/18/2020 4:13:24 PM   Radiology DG Chest Portable 1 View  Result Date: 04/18/2020 CLINICAL DATA:  Chest pain. EXAM: PORTABLE CHEST 1 VIEW COMPARISON:  View chest x-ray 05/02/2019 FINDINGS: Heart size is normal. Atherosclerotic changes are present at the aortic arch. Changes of COPD are again noted. No focal airspace disease is present. No nodule or mass lesion is present. Left subclavian Port-A-Cath is stable. IMPRESSION: 1. No acute cardiopulmonary disease. 2. Stable changes of COPD. Electronically Signed   By: San Morelle M.D.   On: 04/18/2020 12:52    Procedures Procedures   Medications Ordered in ED Medications - No data to display  ED Course  I have reviewed the triage vital signs and  the nursing notes.  Pertinent labs & imaging results that were available during my care of the patient were reviewed by me and considered in my medical decision making (see chart for details).    MDM Rules/Calculators/A&P                         Patient with atypical chest pain.  Troponin x2 is negative EKG is normal.  Labs chest x-ray unremarkable.  Patient will be discharged home and told to return if any problems and he will follow-up with his cardiologist doubt coronary disease Final Clinical Impression(s) / ED Diagnoses Final diagnoses:  Atypical chest pain    Rx / DC Orders ED Discharge Orders    None       Milton Ferguson, MD 04/18/20 1720

## 2020-04-18 NOTE — ED Triage Notes (Signed)
Pt with left sided CP for about 45 min while driving.  + SOB, denies any N/V or dizziness. Hx of CP in past.

## 2020-04-18 NOTE — Discharge Instructions (Addendum)
Follow-up with your cardiologist next week.  Return to the emergency department if you have any more chest pain that does not get better or shortness of breath or sweating.

## 2020-04-29 ENCOUNTER — Encounter: Payer: Self-pay | Admitting: Gastroenterology

## 2020-04-29 ENCOUNTER — Ambulatory Visit: Payer: Medicare Other | Admitting: Internal Medicine

## 2020-04-29 ENCOUNTER — Ambulatory Visit (INDEPENDENT_AMBULATORY_CARE_PROVIDER_SITE_OTHER): Payer: Medicare Other | Admitting: Gastroenterology

## 2020-04-29 ENCOUNTER — Telehealth: Payer: Self-pay | Admitting: Gastroenterology

## 2020-04-29 ENCOUNTER — Other Ambulatory Visit: Payer: Self-pay

## 2020-04-29 VITALS — BP 148/84 | HR 73 | Temp 96.2°F | Ht 69.0 in | Wt 147.8 lb

## 2020-04-29 DIAGNOSIS — K2270 Barrett's esophagus without dysplasia: Secondary | ICD-10-CM

## 2020-04-29 DIAGNOSIS — R110 Nausea: Secondary | ICD-10-CM

## 2020-04-29 DIAGNOSIS — R1319 Other dysphagia: Secondary | ICD-10-CM

## 2020-04-29 DIAGNOSIS — K8689 Other specified diseases of pancreas: Secondary | ICD-10-CM

## 2020-04-29 DIAGNOSIS — R197 Diarrhea, unspecified: Secondary | ICD-10-CM | POA: Diagnosis not present

## 2020-04-29 MED ORDER — PREVALITE 4 G PO PACK: 4.0000 g | PACK | Freq: Every day | ORAL | 3 refills | Status: DC

## 2020-04-29 MED ORDER — SUCRALFATE 1 GM/10ML PO SUSP: 1.0000 g | Freq: Four times a day (QID) | ORAL | 5 refills | Status: DC | PRN

## 2020-04-29 MED ORDER — ONDANSETRON 4 MG PO TBDP: ORAL_TABLET | ORAL | 1 refills | Status: DC

## 2020-04-29 NOTE — Telephone Encounter (Signed)
Opened in error

## 2020-04-29 NOTE — Progress Notes (Signed)
Primary Care Physician: [name], MD  Primary Gastroenterologist:  Garfield Cornea, MD   Chief Complaint  Patient presents with  . Follow-up    diarrhea    HPI: [name] is a 71 y.o. male here for follow-up of diarrhea, weight loss.  Last seen in the office in November 2021.  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  Most recent surgery left him with ileorectal anastomosis.  Colon cancer felt to be in remission, followed by oncology.  He has a history of prior alcohol abuse with pancreatic exocrine insufficiency.  History of multiple small bowel obstructions, chronic GERD, short segment Barrett's esophagus.  Patient completed EGD back in December.  Esophagus was normal except for two 1 cm tongues of salmon-colored epithelium coming up above the GE junction.  Tubular esophagus patent.  Esophagus dilated for history of dysphagia.  Stomach surgically altered with single efferent small bowel limb. Residual stomach appeared normal. Finally, biopsies of the abnormal distal esophagus were taken for histologic study. Bx consistent with reflux changes, given prior biopsy confirmed Barrett's esophagus, Dr. [name] recommended 3 year follow up EGD.  Patient doing well at this time.  Bowel movements have finally slowed down.  He has only 1 or 2 daily most days.  Denies any blood in stool or melena.  No abdominal pain.  Appetite is good.  No dysphagia.  No heartburn.  Weight fluctuates between 145 and 150 pounds. To control diarrhea, he is taking Lomotil 4 times daily, Creon 36,000 units 3 with meals and 2 with snacks, Imodium 2 mg twice daily, Prevalite 4 g daily.  Recent referral to [facility] last year for diarrhea and weight loss.  Referral was denied until we consider colonoscopy with random colon biopsies for microscopic colitis and small intestinal  bowel overgrowth.  Patient does not have any remaining colon, he has ileorectal anastomosis.  Pertinent GI history: Previous work-up included negative celiac screen, negative stool studies. He has a history of osteoporosis and B12 deficiency. Status post cholecystectomy, hemigastrectomy in the [employer] for benign tumor, numerous small bowel resectionsfor abdominal adhesions and bowel obstruction, and subtotal colectomy for colon cancer. Total of 16 abdominal surgeries per patient. EGD and colonoscopy in 2018 as outlined below. Stool pancreatic elastase came back low at 28. He was started on Creon.  EGD December 2021 as outlined above.  EGD in August 2018 with reflux esophagitis, small hiatal hernia status post hemigastrectomy. Single patent efferent small bowel ileum appeared normal, 2 x 2 centimeter tongue of salmon epithelium again seen, esophageal dilation. Biopsies consistent with reflux changes, not Barrett's however this was confirmed on prior EGDs, patient will be offered EGD in August 2021.  Colonoscopy August 2018 status post subtotal colectomy with normal-appearing residual lower GI tract. Next colonoscopy in 5 years.  2 recent admissions and [facility] back in July 2020. First 1 for suspected small bowel obstruction. He had a colonoscopy July 7 showing prior end-to-end colocolonic anastomosis in the rectosigmoid colon. This was patent and was characterized by healthy-appearing mucosa. The anastomosis was traversed. The terminal ileum appeared normal. Prep was inadequate to qualify for colorectal cancer surveillance. Small bowel obstruction treated conservatively and patient was discharged.   Patient underwent resection of the right colon and terminal ileum with creation of ileal rectal anastomosis on October 12, 2018 for large bowel obstruction. Work-up at  admission revealed patent colorectal anastomosis with short segment of cecum and ascending colon attached to the rectum. It was  felt that he had a dysfunctional cecum causing incomplete emptying and was massively distended on CT.   Current Outpatient Medications  Medication Sig Dispense Refill  . ammonium lactate (AMLACTIN) 12 % cream Apply 1 g topically See admin instructions. APPLY CREAM TO DRY SKIN ON LEGS ARMS HANDS AND BACK AREA ONCE TO TWICE DAILY (AVOID FACE GROIN UNDERARMS)  3  . atorvastatin (LIPITOR) 20 MG tablet Take 20 mg by mouth at bedtime.     . carvedilol (COREG) 25 MG tablet Take 0.5 tablets (12.5 mg total) by mouth 2 (two) times daily with a meal. (Patient taking differently: Take 25 mg by mouth 2 (two) times daily with a meal. Takes 25 mg 2 times daily.) 15 tablet 2  . Cholecalciferol (VITAMIN D) 2000 units CAPS Take 2,000 Units by mouth daily.    . diphenoxylate-atropine (LOMOTIL) 2.5-0.025 MG tablet Take 1 tablet by mouth 4 (four) times daily. 120 tablet 3  . enoxaparin (LOVENOX) 60 MG/0.6ML injection Inject 60 mg into the skin daily.    [name] gabapentin (NEURONTIN) 100 MG capsule Take 1 capsule by mouth at bedtime (Patient taking differently: Take 100 mg by mouth at bedtime.) 30 capsule 4  . lipase/protease/amylase (CREON) 36000 UNITS CPEP capsule Take 2 capsules with meals and 1 capsule with snacks. (Patient taking differently: Take 3 capsules with meals and 2 capsule with snacks.) 900 capsule 3  . loperamide (IMODIUM A-D) 2 MG tablet Take 2 mg by mouth in the morning and at bedtime.    . mirtazapine (REMERON) 30 MG tablet Take 1 tablet (30 mg total) by mouth at bedtime. 30 tablet 2  . Multiple Vitamin (MULTIVITAMIN WITH MINERALS) TABS tablet Take 1 tablet by mouth daily.    . Multiple Vitamins-Minerals (OCUVITE EYE HEALTH FORMULA PO) Take 1 tablet by mouth daily.    . Nutritional Supplements (ENSURE PLUS HN) LIQD Take 1 Bottle daily by mouth.     . ondansetron (ZOFRAN ODT) 4 MG disintegrating tablet 4mg  ODT q4 hours prn nausea/vomit (Patient taking differently: Take 4 mg by mouth as needed for nausea or  vomiting. 4mg  ODT q4 hours prn nausea/vomit) 12 tablet 0  . pantoprazole (PROTONIX) 40 MG tablet TAKE 1 TABLET BY MOUTH TWICE DAILY BEFORE  A  MEAL 180 tablet 0  . sucralfate (CARAFATE) 1 GM/10ML suspension Take 10 mLs (1 g total) by mouth 4 (four) times daily as needed. For breakthrough heartburn 420 mL 1  . traMADol (ULTRAM) 50 MG tablet Take 50 mg by mouth daily as needed for moderate pain.     [name] zoledronic acid (RECLAST) 5 MG/100ML SOLN injection Inject 5 mg into the vein See admin instructions. Pt gets a dose yearly. Due again 2018    . PREVALITE 4 g packet Take 4 g by mouth daily. DO NOT TAKE WITHIN 2 HOURS OF OTHER MEDICATIONS     No current facility-administered medications for this visit.   Facility-Administered Medications Ordered in Other Visits  Medication Dose Route Frequency Provider Last Rate Last Admin  . heparin lock flush 100 unit/mL  500 Units Intravenous Once Kefalas, Thomas S, PA-C      . sodium chloride flush (NS) 0.9 % injection 10 mL  10 mL Intravenous PRN Holley Bouche, NP   10 mL at 11/05/16 1136    Allergies as of 04/29/2020  . (No Known Allergies)    ROS:  General: Negative for anorexia, weight loss, fever, chills, fatigue, weakness. ENT: Negative for hoarseness, difficulty swallowing , nasal congestion. CV: Negative for chest pain, angina, palpitations, dyspnea on exertion, peripheral edema.  Respiratory: Negative for dyspnea at rest, dyspnea on exertion, cough, sputum, wheezing.  GI: See history of present illness. GU:  Negative for dysuria, hematuria, urinary incontinence, urinary frequency, nocturnal urination.  Endo: Negative for unusual weight change.    Physical Examination:   BP (!) 148/84   Pulse 73   Temp (!) 96.2 F (35.7 C) (Temporal)   Ht 5\' 9"  (1.753 m)   Wt 147 lb 12.8 oz (67 kg)   BMI 21.83 kg/m   General: Well-nourished, well-developed in no acute distress.  Eyes: No icterus. Mouth: masked. Lungs: Clear to auscultation  bilaterally.  Heart: Regular rate and rhythm, no murmurs rubs or gallops.  Abdomen: Bowel sounds are normal, nontender, nondistended, no hepatosplenomegaly or masses, no abdominal bruits or hernia , no rebound or guarding.   Extremities: No lower extremity edema. No clubbing or deformities. Neuro: Alert and oriented x 4   Skin: Warm and dry, no jaundice.   Psych: Alert and cooperative, normal mood and affect.  Labs:  Lab Results  Component Value Date   CREATININE 1.50 (H) 04/18/2020   BUN 10 04/18/2020   NA 135 04/18/2020   K 5.1 04/18/2020   CL 108 04/18/2020   CO2 22 04/18/2020   Lab Results  Component Value Date   WBC 8.5 04/18/2020   HGB 15.2 04/18/2020   HCT 47.4 04/18/2020   MCV 96.1 04/18/2020   PLT 151 04/18/2020   Lab Results  Component Value Date   TSH 0.74 02/27/2020   Lab Results  Component Value Date   ALT 14 02/28/2020   AST 18 02/28/2020   ALKPHOS 63 02/28/2020   BILITOT 0.5 02/28/2020   Lab Results  Component Value Date   IRON 98 11/22/2019   TIBC 305 11/22/2019   FERRITIN 141 11/22/2019   Lab Results  Component Value Date   VITAMINB12 4,294 (H) 11/22/2019   Lab Results  Component Value Date   FOLATE 50.8 08/16/2018        Imaging Studies: DG Chest Portable 1 View  Result Date: 04/18/2020 CLINICAL DATA:  Chest pain. EXAM: PORTABLE CHEST 1 VIEW COMPARISON:  View chest x-ray 05/02/2019 FINDINGS: Heart size is normal. Atherosclerotic changes are present at the aortic arch. Changes of COPD are again noted. No focal airspace disease is present. No nodule or mass lesion is present. Left subclavian Port-A-Cath is stable. IMPRESSION: 1. No acute cardiopulmonary disease. 2. Stable changes of COPD. Electronically Signed   By: San Morelle M.D.   On: 04/18/2020 12:52   Assessment/plan:  Pleasant 70 year old male with history of adenocarcinoma of the colon, status post resection and required multiple subsequent surgeries for colon cancer and  small bowel obstructions, now left with ileorectal anastomosis presenting for follow-up of weight loss and diarrhea..  History of pancreatic exocrine insufficiency, GERD, short segment Barrett's esophagus.  Diarrhea: Finally improved.  Bowel regimen currently includes Creon 36,000 units, 3 with meals and 2 with snacks, Imodium 2 mg twice daily, Prevalite 4 g daily, Lomotil 2.5 mg 4 times daily.  Typically having 1-2 bowel movements per day.  Appetite has been good.  Weight has stabilized.  Plan for return office visit in 6 months, call sooner as needed.  GERD: Symptoms well controlled.  Short segment Barrett's esophagus noted endoscopically, biopsy more consistent with reflux.  Given  biopsy-proven Barrett's in the past, plan for surveillance EGD in 2024.  History of colon cancer: Stage III status post multiple surgeries as outlined.  Surveillance study plan for 2023.  Will be limited exam given ileorectal anastomosis.  History of enlarged heterogeneous prostate gland on CT in June 2021.  Will have patient follow-up with urology if he has not done so already.

## 2020-04-29 NOTE — Patient Instructions (Signed)
1. Continue medications as before.  Prescription for Carafate, Zofran, Prevalite sent to your pharmacy. 2. Please call with any questions or concerns.  Otherwise we will see you back in 6 months.

## 2020-05-01 ENCOUNTER — Telehealth: Payer: Self-pay | Admitting: *Deleted

## 2020-05-01 ENCOUNTER — Other Ambulatory Visit: Payer: Self-pay

## 2020-05-01 NOTE — Telephone Encounter (Signed)
Received fax from [facility] patient is requesting Rx for Creon to be sent to them.

## 2020-05-02 MED ORDER — PANTOPRAZOLE SODIUM 40 MG PO TBEC: 40.0000 mg | DELAYED_RELEASE_TABLET | Freq: Two times a day (BID) | ORAL | 3 refills | Status: DC

## 2020-05-02 MED ORDER — PANCRELIPASE (LIP-PROT-AMYL) 36000-114000 UNITS PO CPEP: ORAL_CAPSULE | ORAL | 3 refills | Status: DC

## 2020-05-02 NOTE — Telephone Encounter (Signed)
Completed.

## 2020-05-02 NOTE — Addendum Note (Signed)
Addended by: Annitta Needs on: 05/02/2020 10:52 AM   Modules accepted: Orders

## 2020-05-28 ENCOUNTER — Inpatient Hospital Stay (HOSPITAL_COMMUNITY): Payer: Medicare Other | Attending: Hematology

## 2020-05-28 ENCOUNTER — Other Ambulatory Visit: Payer: Self-pay

## 2020-05-28 ENCOUNTER — Encounter (HOSPITAL_COMMUNITY): Payer: Self-pay

## 2020-05-28 DIAGNOSIS — C189 Malignant neoplasm of colon, unspecified: Secondary | ICD-10-CM

## 2020-05-28 DIAGNOSIS — Z452 Encounter for adjustment and management of vascular access device: Secondary | ICD-10-CM | POA: Insufficient documentation

## 2020-05-28 DIAGNOSIS — D509 Iron deficiency anemia, unspecified: Secondary | ICD-10-CM | POA: Diagnosis not present

## 2020-05-28 DIAGNOSIS — Z79899 Other long term (current) drug therapy: Secondary | ICD-10-CM | POA: Diagnosis not present

## 2020-05-28 DIAGNOSIS — I10 Essential (primary) hypertension: Secondary | ICD-10-CM | POA: Insufficient documentation

## 2020-05-28 DIAGNOSIS — G629 Polyneuropathy, unspecified: Secondary | ICD-10-CM | POA: Insufficient documentation

## 2020-05-28 DIAGNOSIS — E559 Vitamin D deficiency, unspecified: Secondary | ICD-10-CM | POA: Insufficient documentation

## 2020-05-28 DIAGNOSIS — Z87891 Personal history of nicotine dependence: Secondary | ICD-10-CM | POA: Insufficient documentation

## 2020-05-28 DIAGNOSIS — Z85038 Personal history of other malignant neoplasm of large intestine: Secondary | ICD-10-CM | POA: Diagnosis present

## 2020-05-28 LAB — COMPREHENSIVE METABOLIC PANEL
ALT: 20 U/L (ref 0–44)
AST: 18 U/L (ref 15–41)
Albumin: 3.5 g/dL (ref 3.5–5.0)
Alkaline Phosphatase: 65 U/L (ref 38–126)
Anion gap: 8 (ref 5–15)
BUN: 14 mg/dL (ref 8–23)
CO2: 19 mmol/L — ABNORMAL LOW (ref 22–32)
Calcium: 8.3 mg/dL — ABNORMAL LOW (ref 8.9–10.3)
Chloride: 111 mmol/L (ref 98–111)
Creatinine, Ser: 1.53 mg/dL — ABNORMAL HIGH (ref 0.61–1.24)
GFR, Estimated: 49 mL/min — ABNORMAL LOW (ref >60)
Glucose, Bld: 118 mg/dL — ABNORMAL HIGH (ref 70–99)
Potassium: 4.1 mmol/L (ref 3.5–5.1)
Sodium: 138 mmol/L (ref 135–145)
Total Bilirubin: 0.5 mg/dL (ref 0.3–1.2)
Total Protein: 6.2 g/dL — ABNORMAL LOW (ref 6.5–8.1)

## 2020-05-28 LAB — CBC WITH DIFFERENTIAL/PLATELET
Abs Immature Granulocytes: 0.03 10*3/uL (ref 0.00–0.07)
Basophils Absolute: 0 10*3/uL (ref 0.0–0.1)
Basophils Relative: 0 %
Eosinophils Absolute: 0.3 10*3/uL (ref 0.0–0.5)
Eosinophils Relative: 3 %
HCT: 43 % (ref 39.0–52.0)
Hemoglobin: 13.8 g/dL (ref 13.0–17.0)
Immature Granulocytes: 0 %
Lymphocytes Relative: 28 %
Lymphs Abs: 2.6 10*3/uL (ref 0.7–4.0)
MCH: 30.1 pg (ref 26.0–34.0)
MCHC: 32.1 g/dL (ref 30.0–36.0)
MCV: 93.9 fL (ref 80.0–100.0)
Monocytes Absolute: 0.9 10*3/uL (ref 0.1–1.0)
Monocytes Relative: 10 %
Neutro Abs: 5.4 10*3/uL (ref 1.7–7.7)
Neutrophils Relative %: 59 %
Platelets: 119 10*3/uL — ABNORMAL LOW (ref 150–400)
RBC: 4.58 MIL/uL (ref 4.22–5.81)
RDW: 13.9 % (ref 11.5–15.5)
WBC: 9.2 10*3/uL (ref 4.0–10.5)
nRBC: 0 % (ref 0.0–0.2)

## 2020-05-28 LAB — IRON AND TIBC
Iron: 71 ug/dL (ref 45–182)
Saturation Ratios: 24 % (ref 17.9–39.5)
TIBC: 299 ug/dL (ref 250–450)
UIBC: 228 ug/dL

## 2020-05-28 LAB — FERRITIN: Ferritin: 64 ng/mL (ref 24–336)

## 2020-05-28 MED ORDER — HEPARIN SOD (PORK) LOCK FLUSH 100 UNIT/ML IV SOLN
500.0000 [IU] | Freq: Once | INTRAVENOUS | Status: AC
  Administered 2020-05-28: 500 [IU] via INTRAVENOUS

## 2020-05-28 MED ORDER — SODIUM CHLORIDE 0.9% FLUSH
10.0000 mL | Freq: Once | INTRAVENOUS | Status: AC
  Administered 2020-05-28: 10 mL via INTRAVENOUS

## 2020-05-28 NOTE — Progress Notes (Signed)
Labs drawn from port.  Patients port flushed without difficulty.  Good blood return noted with no bruising or swelling noted at site.  Band aid applied.  VSS with discharge and left in satisfactory condition with no s/s of distress noted.

## 2020-05-29 LAB — CEA: CEA: 2 ng/mL (ref 0.0–4.7)

## 2020-06-04 ENCOUNTER — Other Ambulatory Visit: Payer: Self-pay

## 2020-06-04 ENCOUNTER — Inpatient Hospital Stay (HOSPITAL_BASED_OUTPATIENT_CLINIC_OR_DEPARTMENT_OTHER): Payer: Medicare Other | Admitting: Hematology

## 2020-06-04 VITALS — BP 107/79 | HR 70 | Temp 98.2°F | Resp 18 | Wt 149.0 lb

## 2020-06-04 DIAGNOSIS — C189 Malignant neoplasm of colon, unspecified: Secondary | ICD-10-CM | POA: Diagnosis not present

## 2020-06-04 DIAGNOSIS — Z452 Encounter for adjustment and management of vascular access device: Secondary | ICD-10-CM | POA: Diagnosis not present

## 2020-06-04 NOTE — Patient Instructions (Signed)
[place] at [facility] Discharge Instructions  You were seen today by Dr. Delton Coombes. He went over your recent results. Purchase iron tablets over the counter and start taking 1 tablet every other day; if you develop constipation, purchase Colace or any other stool softener and take as needed.  Dr. Delton Coombes will see you back in 6 months for labs and follow up.   Thank you for choosing [place] at [facility] to provide your oncology and hematology care.  To afford each patient quality time with our provider, please arrive at least 15 minutes before your scheduled appointment time.   If you have a lab appointment with the [facility] please come in thru the Main Entrance and check in at the main information desk  You need to re-schedule your appointment should you arrive 10 or more minutes late.  We strive to give you quality time with our providers, and arriving late affects you and other patients whose appointments are after yours.  Also, if you no show three or more times for appointments you may be dismissed from the clinic at the providers discretion.     Again, thank you for choosing [facility].  Our hope is that these requests will decrease the amount of time that you wait before being seen by our physicians.       _____________________________________________________________  Should you have questions after your visit to [facility], please contact our office at [phone] between the hours of 8:00 a.m. and 4:30 p.m.  Voicemails left after 4:00 p.m. will not be returned until the following business day.  For prescription refill requests, have your pharmacy contact our office and allow 72 hours.    Cancer Center Support Programs:   > Cancer Support Group  2nd Tuesday of the month 1pm-2pm, Journey Room

## 2020-06-04 NOTE — Progress Notes (Signed)
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 Left hemicolectomy on 07/2010. 2. FOLFOX x 7 cycles from 09/28/2010 to 01/04/2011 stopped due to neuropathy.  NGS Results: Not done  CURRENT THERAPY: Surveillance; intermittent Feraheme last on 09/05/2018  BRIEF ONCOLOGIC HISTORY:  Oncology History   No history exists.    CANCER STAGING: Cancer Staging Adenocarcinoma of colon with mucinous features Staging form: Colon and Rectum, AJCC 7th Edition - Clinical: Stage IIIB (T3, N1, M0) - Signed by Baird Cancer, PA on 09/22/2010   INTERVAL HISTORY:  [name], a 70 y.o. 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 [name] was last seen on 11/29/2019.   Today he reports feeling well. He denies having any new abdominal pain, melena, hematochezia or hematuria. He continues having diarrhea but his appetite is great. He continues taking gabapentin 100 mg for the numbness in his feet. He is taking vitamin D daily. He denies having any easy bruising. He is not taking iron tablets.   REVIEW OF SYSTEMS:  Review of Systems  Constitutional: Positive for appetite change (75%) and fatigue (50%).  Gastrointestinal: Positive for diarrhea.  Neurological: Positive for numbness (feet).  All other systems reviewed and are negative.   PAST MEDICAL/SURGICAL HISTORY:  Past Medical History:  Diagnosis Date  . 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 Anemia   . Anxiety   . Arthritis   . Barrett's esophagus   . Blood transfusion   . Bowel obstruction ([place]) 05/13/2012   Recurrent  . Bronchitis   . Chest pain at rest   . Chronic abdominal pain   . Erosive esophagitis   .  ETOH abuse    quit 03/2010  . GERD (gastroesophageal reflux disease)   . Hx of Clostridium difficile infection 01/2012  . Hypertension   . Ileus ([place])   . Iron deficiency anemia 03/23/2016  . Obstruction of bowel ([place]) 03/03/14  . Osteoporosis   . Personal history of PE (pulmonary embolism) 10/01/2010  . Pneumonia   . Pulmonary embolism ([place]) 02/2010  . Recurrent upper respiratory infection (URI)   . Renal disorder   . S/P endoscopy September 28, 2010   erosive reflux esophagitis, Billroth I anatomy  . 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 Seizures ([place])    was on meds for 6 months, Unknown etiology, last seizure was 2017.  [name] Shortness of breath   . TIA (transient ischemic attack) 10/11  . Vitamin B12 deficiency    Past Surgical History:  Procedure Laterality Date  . ABDOMINAL ADHESION SURGERY  03/04/15   @ [facility]  . ABDOMINAL EXPLORATION SURGERY    . abdominal sugery     for bowel obstruction x 8, all in 1980s, except for one in 07/2010  . APPENDECTOMY  1980s  . Billroth 1 hemigastrectomy  1980s   per patient for benign duodenal tumor  . CARDIAC CATHETERIZATION  07/17/2012  . CHOLECYSTECTOMY  1980s  . COLON SURGERY  May 2012   left hemicolectomy, colon cancer found at time of surgery for bowel obstruction  . COLON SURGERY  10/12/2018   At [facility]: Resection of right colon and terminal ileum with creation  of ileorectal anastomosis for large bowel obstruction.  Cecum and ascending colon noted to be attached to the rectum.  . COLONOSCOPY  03/18/2011   anastomosis at 35cm. Several adenomatous polyps removed. Sigmoid diverticulosis. Next TCS 02/2013  . COLONOSCOPY N/A 07/24/2012   UJW:[id number] post segmental resection with normal-appearing colonic anastomosis aside from an adjacent polyp-removed as described above. Rectal polyp-removed as described above. CT findings appear to have been artifactual. tubular adenomas/prolapsed type polyp.  . COLONOSCOPY N/A 05/15/2015   Procedure: COLONOSCOPY;  Surgeon:  [name], MD;  [facility];  Service: Endoscopy;  Laterality: N/A;  . COLONOSCOPY  09/26/2018   at [facility]: prep not adequate for colon cancer surveillance.  Prior end-to-end colonic anastomosis in the rectosigmoid region.  This was patent and characterized by healthy-appearing mucosa.  Anastomosis was traversed.  Normal terminal ileum.  . COLONOSCOPY WITH PROPOFOL N/A 11/04/2016   Dr. [name], status post subtotal colectomy with normal-appearing residual lower GI tract.  Next colonoscopy in 5 years.  . ESOPHAGOGASTRODUODENOSCOPY  09/28/2010  . ESOPHAGOGASTRODUODENOSCOPY  12/01/2010   Cervical web status post dilation, erosive esophagitis, B1 hemigastrectomy, inflamed anastomosis  . ESOPHAGOGASTRODUODENOSCOPY  04/16/2011   excoriation at [facility] c/w trauma/M-W tear, friable gastric anastomosis, dilation efferent limb  . ESOPHAGOGASTRODUODENOSCOPY N/A 06/03/2014   Dr.[name]- cervcal esopphageal web s/p dilation. abnormal distal esophagus bx= barretts esophagus  . ESOPHAGOGASTRODUODENOSCOPY N/A 05/15/2015   Procedure: ESOPHAGOGASTRODUODENOSCOPY (EGD);  Surgeon: [name], MD;  [facility];  Service: Endoscopy;  Laterality: N/A;  230  . ESOPHAGOGASTRODUODENOSCOPY (EGD) WITH ESOPHAGEAL DILATION  02/25/2012   WGN:FAOZHYQM esophageal web-s/p dilation anddisruption as described above. Status post prior gastric with Billroth I configuration. Abnormal gastric mucosa at the anastomosis. Gastric biopsy showed mild chronic inflammation but no H. pylori   . ESOPHAGOGASTRODUODENOSCOPY (EGD) WITH PROPOFOL N/A 11/04/2016   Dr. [name]: Reflux esophagitis, small hiatal hernia status post hemigastrectomy.  Single patent efferent small bowel limb appeared normal, 2 x 2 centimeter tongue of salmon epithelium again seen, esophageal dilation.  Biopsies consistent with reflux changes, not Barrett's however this was confirmed on prior EGDs.  Offer 3-year follow-up EGD August 2021.  .  ESOPHAGOGASTRODUODENOSCOPY (EGD) WITH PROPOFOL N/A 03/03/2020   Procedure: ESOPHAGOGASTRODUODENOSCOPY (EGD) WITH PROPOFOL;  Surgeon: [name], MD;  [facility];  Service: Endoscopy;  Laterality: N/A;  3:00pm  . HERNIA REPAIR     right inguinal  . MALONEY DILATION N/A 06/03/2014   Procedure: [name] DILATION;  Surgeon: [name], MD;  [facility];  Service: Endoscopy;  Laterality: N/A;  [name] DILATION N/A 05/15/2015   Procedure: [name] DILATION;  Surgeon: [name], MD;  [facility];  Service: Endoscopy;  Laterality: N/A;  . [name] DILATION N/A 11/04/2016   Procedure: [name] DILATION;  Surgeon: [name], MD;  [facility];  Service: Endoscopy;  Laterality: N/A;  . [name] DILATION N/A 03/03/2020   Procedure: [name] DILATION;  Surgeon: [name], MD;  [facility];  Service: Endoscopy;  Laterality: N/A;  . PORTACATH PLACEMENT    . SAVORY DILATION N/A 06/03/2014   Procedure: SAVORY DILATION;  Surgeon: [name], MD;  [facility];  Service: Endoscopy;  Laterality: N/A;    SOCIAL HISTORY:  Social History   Socioeconomic History  . Marital status: Married    Spouse name: Not on file  . Number of children: 3  . Years of education: Not on file  .  Highest education level: Not on file  Occupational History    Employer: [employer]  Tobacco Use  . Smoking status: Former Smoker    Packs/day: 0.50    Years: 40.00    Pack years: 20.00    Types: Cigarettes    Quit date: 12/20/2012    Years since quitting: 7.4  . Smokeless tobacco: Never Used  Vaping Use  . Vaping Use: Never used  Substance and Sexual Activity  . Alcohol use: No  . Drug use: No  . Sexual activity: Never  Other Topics Concern  . Not on file  Social History Narrative  . Not on file   Social Determinants of Health   Financial Resource Strain: Not on file  Food Insecurity: Not on file  Transportation Needs:  Not on file  Physical Activity: Not on file  Stress: Not on file  Social Connections: Not on file  Intimate Partner Violence: Not on file    FAMILY HISTORY:  Family History  Problem Relation Age of Onset  . Hypertension Mother   . Arthritis Mother   . Pneumonia Mother   . Hypertension Father   . Heart attack Father   . Colon cancer Neg Hx     CURRENT MEDICATIONS:  Current Outpatient Medications  Medication Sig Dispense Refill  . ammonium lactate (AMLACTIN) 12 % cream Apply 1 g topically See admin instructions. APPLY CREAM TO DRY SKIN ON LEGS ARMS HANDS AND BACK AREA ONCE TO TWICE DAILY (AVOID FACE GROIN UNDERARMS)  3  . atorvastatin (LIPITOR) 20 MG tablet Take 20 mg by mouth at bedtime.     . baclofen (LIORESAL) 10 MG tablet     . carvedilol (COREG) 25 MG tablet Take 0.5 tablets (12.5 mg total) by mouth 2 (two) times daily with a meal. (Patient taking differently: Take 25 mg by mouth 2 (two) times daily with a meal. Takes 25 mg 2 times daily.) 15 tablet 2  . Cholecalciferol (VITAMIN D) 2000 units CAPS Take 2,000 Units by mouth daily.    . cyclobenzaprine (FLEXERIL) 5 MG tablet     . diphenoxylate-atropine (LOMOTIL) 2.5-0.025 MG tablet Take 1 tablet by mouth 4 (four) times daily. 120 tablet 3  . enoxaparin (LOVENOX) 60 MG/0.6ML injection Inject 60 mg into the skin daily.    [name] gabapentin (NEURONTIN) 100 MG capsule Take 1 capsule by mouth at bedtime (Patient taking differently: Take 100 mg by mouth at bedtime.) 30 capsule 4  . HYDROcodone-acetaminophen (NORCO/VICODIN) 5-325 MG tablet     . ibuprofen (ADVIL) 800 MG tablet     . lipase/protease/amylase (CREON) 36000 UNITS CPEP capsule Take 3 capsules with meals and 2 capsule with snacks. 900 capsule 3  . loperamide (IMODIUM A-D) 2 MG tablet Take 2 mg by mouth in the morning and at bedtime.    . methylPREDNISolone (MEDROL DOSEPAK) 4 MG TBPK tablet     . mirtazapine (REMERON) 30 MG tablet Take 1 tablet (30 mg total) by mouth at bedtime.  30 tablet 2  . Multiple Vitamin (MULTIVITAMIN WITH MINERALS) TABS tablet Take 1 tablet by mouth daily.    . Multiple Vitamins-Minerals (OCUVITE EYE HEALTH FORMULA PO) Take 1 tablet by mouth daily.    . Nutritional Supplements (ENSURE PLUS HN) LIQD Take 1 Bottle daily by mouth.     . ondansetron (ZOFRAN ODT) 4 MG disintegrating tablet 4mg  ODT q4 hours prn nausea/vomit 30 tablet 1  . pantoprazole (PROTONIX) 40 MG tablet Take 1 tablet (40 mg total) by  mouth 2 (two) times daily before a meal. 180 tablet 3  . PREVALITE 4 g packet Take 1 packet (4 g total) by mouth daily. DO NOT TAKE WITHIN 2 HOURS OF OTHER MEDICATIONS 90 packet 3  . sucralfate (CARAFATE) 1 GM/10ML suspension Take 10 mLs (1 g total) by mouth 4 (four) times daily as needed. For breakthrough heartburn 420 mL 5  . traMADol (ULTRAM) 50 MG tablet Take 50 mg by mouth daily as needed for moderate pain.     [name] zoledronic acid (RECLAST) 5 MG/100ML SOLN injection Inject 5 mg into the vein See admin instructions. Pt gets a dose yearly. Due again 2018     No current facility-administered medications for this visit.   Facility-Administered Medications Ordered in Other Visits  Medication Dose Route Frequency Provider Last Rate Last Admin  . heparin lock flush 100 unit/mL  500 Units Intravenous Once Kefalas, Thomas S, PA-C      . sodium chloride flush (NS) 0.9 % injection 10 mL  10 mL Intravenous PRN Holley Bouche, NP   10 mL at 11/05/16 1136    ALLERGIES:  No Known Allergies  PHYSICAL EXAM:  Performance status (ECOG): 1 - Symptomatic but completely ambulatory  Vitals:   06/04/20 1141  BP: 107/79  Pulse: 70  Resp: 18  Temp: 98.2 F (36.8 C)  SpO2: 97%   Wt Readings from Last 3 Encounters:  06/04/20 149 lb (67.6 kg)  04/29/20 147 lb 12.8 oz (67 kg)  04/18/20 145 lb (65.8 kg)   Physical Exam Vitals reviewed.  Constitutional:      Appearance: Normal appearance.  Cardiovascular:     Rate and Rhythm: Normal rate and regular  rhythm.     Pulses: Normal pulses.     Heart sounds: Normal heart sounds.  Pulmonary:     Effort: Pulmonary effort is normal.     Breath sounds: Normal breath sounds.  Abdominal:     Palpations: Abdomen is soft. There is no hepatomegaly, splenomegaly or mass.     Tenderness: There is no abdominal tenderness.     Hernia: No hernia is present.  Musculoskeletal:     Right lower leg: No edema.     Left lower leg: No edema.  Lymphadenopathy:     Lower Body: No right inguinal adenopathy. No left inguinal adenopathy.  Neurological:     General: No focal deficit present.     Mental Status: He is alert and oriented to person, place, and time.  Psychiatric:        Mood and Affect: Mood normal.        Behavior: Behavior normal.      LABORATORY DATA:  I have reviewed the labs as listed.  CBC Latest Ref Rng & Units 05/28/2020 04/18/2020 02/28/2020  WBC 4.0 - 10.5 K/uL 9.2 8.5 7.6  Hemoglobin 13.0 - 17.0 g/dL 13.8 15.2 13.3  Hematocrit 39.0 - 52.0 % 43.0 47.4 41.1  Platelets 150 - 400 K/uL 119(L) 151 136(L)   CMP Latest Ref Rng & Units 05/28/2020 04/18/2020 02/28/2020  Glucose 70 - 99 mg/dL 118(H) 105(H) 101(H)  BUN 8 - 23 mg/dL 14 10 7(L)  Creatinine 0.61 - 1.24 mg/dL 1.53(H) 1.50(H) 1.40(H)  Sodium 135 - 145 mmol/L 138 135 138  Potassium 3.5 - 5.1 mmol/L 4.1 5.1 4.4  Chloride 98 - 111 mmol/L 111 108 108  CO2 22 - 32 mmol/L 19(L) 22 21(L)  Calcium 8.9 - 10.3 mg/dL 8.3(L) 8.7(L) 8.3(L)  Total Protein 6.5 -  8.1 g/dL 6.2(L) - 6.3(L)  Total Bilirubin 0.3 - 1.2 mg/dL 0.5 - 0.5  Alkaline Phos 38 - 126 U/L 65 - 63  AST 15 - 41 U/L 18 - 18  ALT 0 - 44 U/L 20 - 14   Lab Results  Component Value Date   TIBC 299 05/28/2020   TIBC 305 11/22/2019   TIBC 306 05/23/2019   FERRITIN 64 05/28/2020   FERRITIN 141 11/22/2019   FERRITIN 96 05/23/2019   IRONPCTSAT 24 05/28/2020   IRONPCTSAT 32 11/22/2019   IRONPCTSAT 37 05/23/2019   Lab Results  Component Value Date   CEA1 2.0 05/28/2020   CEA1  2.5 11/22/2019   CEA1 2.0 05/23/2019    DIAGNOSTIC IMAGING:  I have independently reviewed the scans and discussed with the patient. No results found.   ASSESSMENT:  1. Stage III (T3N1) adenocarcinoma the left colon: -Status post surgical resection in May 2012, 1 out of 18 lymph nodes positive, could only complete 7 out of 12 FOLFOX chemotherapy secondary to neuropathy. -Last EGD/colonoscopy on 05/15/2015 was normal. -CT scan in October 2018 was negative for metastatic disease. -Bowel resection in July last year secondary to small bowel obstruction at [facility].  2. Iron deficiency: -This is from combination of CKD and relative iron deficiency. -Last Feraheme on 08/29/2018 and 09/05/2018.  3. Peripheral neuropathy: -Neuropathy and the tingling and numbness in the feet. -He is taking gabapentin 100 mg at bedtime which is helping with pins-and-needles sensation.   PLAN:  1. Stage III (T3N1) adenocarcinoma the left colon: -Denies any change in bowel habits or bleeding per rectum or melena. -Physical examination today did not reveal any suspicious masses. -Reviewed labs from 05/28/2020 which showed normal LFTs.  CEA was normal at 2.0. -RTC 6 months.  2. Iron deficiency: -Hemoglobin is 13.8 with ferritin 64.  No parenteral iron therapy needed.  We will plan to repeat labs in 6 months.  3. Peripheral neuropathy: -Continue gabapentin at bedtime.  4.  Vitamin D deficiency: -Continue vitamin D 1000 units daily.   Orders placed this encounter:  No orders of the defined types were placed in this encounter.    Derek Jack, MD [place] [phone]   I, Milinda Antis, am acting as a scribe for Dr. Sanda Linger.  I, Derek Jack MD, have reviewed the above documentation for accuracy and completeness, and I agree with the above.

## 2020-07-04 ENCOUNTER — Other Ambulatory Visit (HOSPITAL_COMMUNITY): Payer: Self-pay | Admitting: Hematology

## 2020-07-04 DIAGNOSIS — C189 Malignant neoplasm of colon, unspecified: Secondary | ICD-10-CM

## 2020-07-15 ENCOUNTER — Other Ambulatory Visit (HOSPITAL_COMMUNITY): Payer: Self-pay | Admitting: Hematology

## 2020-07-15 DIAGNOSIS — C189 Malignant neoplasm of colon, unspecified: Secondary | ICD-10-CM

## 2020-07-15 DIAGNOSIS — R630 Anorexia: Secondary | ICD-10-CM

## 2020-08-04 ENCOUNTER — Other Ambulatory Visit (HOSPITAL_COMMUNITY): Payer: Self-pay | Admitting: Hematology

## 2020-08-04 DIAGNOSIS — C189 Malignant neoplasm of colon, unspecified: Secondary | ICD-10-CM

## 2020-08-22 ENCOUNTER — Other Ambulatory Visit: Payer: Self-pay | Admitting: Gastroenterology

## 2020-08-26 ENCOUNTER — Other Ambulatory Visit: Payer: Self-pay | Admitting: Gastroenterology

## 2020-09-01 ENCOUNTER — Telehealth: Payer: Self-pay

## 2020-09-01 NOTE — Telephone Encounter (Signed)
Prescription faxed to [facility] in [place] for Diphenoxylate-atropine (Lomotil) 2.5-.025 mg tablet

## 2020-09-04 ENCOUNTER — Encounter (HOSPITAL_COMMUNITY): Payer: Medicare Other

## 2020-09-08 ENCOUNTER — Other Ambulatory Visit: Payer: Self-pay

## 2020-09-08 ENCOUNTER — Inpatient Hospital Stay (HOSPITAL_COMMUNITY): Payer: Medicare Other | Attending: Hematology

## 2020-09-08 DIAGNOSIS — Z452 Encounter for adjustment and management of vascular access device: Secondary | ICD-10-CM | POA: Insufficient documentation

## 2020-09-08 DIAGNOSIS — C186 Malignant neoplasm of descending colon: Secondary | ICD-10-CM | POA: Diagnosis present

## 2020-09-08 MED ORDER — HEPARIN SOD (PORK) LOCK FLUSH 100 UNIT/ML IV SOLN
500.0000 [IU] | Freq: Once | INTRAVENOUS | Status: AC
  Administered 2020-09-08: 500 [IU] via INTRAVENOUS

## 2020-09-08 MED ORDER — SODIUM CHLORIDE 0.9% FLUSH
10.0000 mL | Freq: Once | INTRAVENOUS | Status: AC
  Administered 2020-09-08: 10 mL

## 2020-09-08 NOTE — Patient Instructions (Signed)
[facility]  Discharge Instructions: Thank you for choosing [facility] to provide your oncology and hematology care.  If you have a lab appointment with the [facility], please come in thru the Main Entrance and check in at the main information desk.  Wear comfortable clothing and clothing appropriate for easy access to any Portacath or PICC line.   We strive to give you quality time with your provider. You may need to reschedule your appointment if you arrive late (15 or more minutes).  Arriving late affects you and other patients whose appointments are after yours.  Also, if you miss three or more appointments without notifying the office, you may be dismissed from the clinic at the provider's discretion.      For prescription refill requests, have your pharmacy contact our office and allow 72 hours for refills to be completed.    Today you received PORT flush      To help prevent nausea and vomiting after your treatment, we encourage you to take your nausea medication as directed.  BELOW ARE SYMPTOMS THAT SHOULD BE REPORTED IMMEDIATELY: *FEVER GREATER THAN 100.4 F (38 C) OR HIGHER *CHILLS OR SWEATING *NAUSEA AND VOMITING THAT IS NOT CONTROLLED WITH YOUR NAUSEA MEDICATION *UNUSUAL SHORTNESS OF BREATH *UNUSUAL BRUISING OR BLEEDING *URINARY PROBLEMS (pain or burning when urinating, or frequent urination) *BOWEL PROBLEMS (unusual diarrhea, constipation, pain near the anus) TENDERNESS IN MOUTH AND THROAT WITH OR WITHOUT PRESENCE OF ULCERS (sore throat, sores in mouth, or a toothache) UNUSUAL RASH, SWELLING OR PAIN  UNUSUAL VAGINAL DISCHARGE OR ITCHING   Items with * indicate a potential emergency and should be followed up as soon as possible or go to the Emergency Department if any problems should occur.  Please show the CHEMOTHERAPY ALERT CARD or IMMUNOTHERAPY ALERT CARD at check-in to the Emergency Department and triage nurse.  Should you have questions  after your visit or need to cancel or reschedule your appointment, please contact [facility] [phone]  and follow the prompts.  Office hours are 8:00 a.m. to 4:30 p.m. Monday - Friday. Please note that voicemails left after 4:00 p.m. may not be returned until the following business day.  We are closed weekends and major holidays. You have access to a nurse at all times for urgent questions. Please call the main number to the clinic [phone] and follow the prompts.  For any non-urgent questions, you may also contact your provider using MyChart. We now offer e-Visits for anyone 6 and older to request care online for non-urgent symptoms. For details visit [email or web address].   Also download the MyChart app! Go to the app store, search "MyChart", open the app, select [facility], and log in with your MyChart username and password.  Due to Covid, a mask is required upon entering the hospital/clinic. If you do not have a mask, one will be given to you upon arrival. For doctor visits, patients may have 1 support person aged 19 or older with them. For treatment visits, patients cannot have anyone with them due to current Covid guidelines and our immunocompromised population.

## 2020-09-08 NOTE — Progress Notes (Signed)
Patients port flushed without difficulty.  Good blood return noted with no bruising or swelling noted at site.  Band aid applied.  VSS with discharge and left in satisfactory condition with no s/s of distress noted.   

## 2020-10-16 ENCOUNTER — Encounter: Payer: Self-pay | Admitting: Internal Medicine

## 2020-10-22 ENCOUNTER — Other Ambulatory Visit: Payer: Self-pay | Admitting: Gastroenterology

## 2020-11-11 ENCOUNTER — Ambulatory Visit (INDEPENDENT_AMBULATORY_CARE_PROVIDER_SITE_OTHER): Payer: Medicare Other | Admitting: Gastroenterology

## 2020-11-11 ENCOUNTER — Encounter: Payer: Self-pay | Admitting: Gastroenterology

## 2020-11-11 ENCOUNTER — Other Ambulatory Visit: Payer: Self-pay

## 2020-11-11 VITALS — BP 121/71 | HR 67 | Temp 97.5°F | Ht 69.0 in | Wt 133.2 lb

## 2020-11-11 DIAGNOSIS — R634 Abnormal weight loss: Secondary | ICD-10-CM

## 2020-11-11 DIAGNOSIS — D649 Anemia, unspecified: Secondary | ICD-10-CM | POA: Diagnosis not present

## 2020-11-11 DIAGNOSIS — K8689 Other specified diseases of pancreas: Secondary | ICD-10-CM

## 2020-11-11 DIAGNOSIS — K2270 Barrett's esophagus without dysplasia: Secondary | ICD-10-CM | POA: Diagnosis not present

## 2020-11-11 NOTE — Progress Notes (Signed)
Primary Care Physician: [name], MD  Primary Gastroenterologist:  Garfield Cornea, MD   Chief Complaint  Patient presents with   Diarrhea    FU, PCP thinks pt losing blood somewhere, potassium high, low blood count, high B12    HPI: [name] is a 70 y.o. male here for low hemoglobin. He was last seen 04/2020. 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  Most recent surgery left him with ileorectal anastomosis.  Colon cancer felt to be in remission, followed by oncology.  He has a history of prior alcohol abuse with pancreatic exocrine insufficiency.  History of multiple small bowel obstructions, chronic GERD, short segment Barrett's esophagus.  Patient with history of iron deficiency anemia followed by hematology, felt to be due to combination of chronic kidney disease and relative iron deficiency.  He has been receiving intermittent iron infusions as needed, last time in June 2020.  EGD 02/2020: Esophagus was normal except for two 1 cm tongues of salmon-colored epithelium coming up above the GE junction.  Tubular esophagus patent.  Esophagus dilated for history of dysphagia.  Stomach surgically altered with single efferent small bowel limb. Residual stomach appeared normal. Finally, biopsies of the abnormal distal esophagus were taken for histologic study. Bx consistent with reflux changes, given prior biopsy confirmed Barrett's esophagus, Dr. [name] recommended 3 year follow up EGD.  Today: Weight typically fluctuates between 145-150 pounds but he is down to 133 pounds today. Eating habits have not changed. BM every time eats.  Takes Lomotil 4 times daily, Creon 3 with meals 2 with snacks, Imodium twice daily as needed, Prevalite 4 g daily.  Stools always watery. No nocturnal stools. No melena, brbpr. No nosebleeds or gross hematuria.  Postprandial LUQ pain, can be significant. Will usually get better after BM. No heartburn. No dysphagia. No n/v. Some nausea but no vomiting. States he was in hospital in 05/2020 with another bowel obstruction, treated with NG tube and bowel rest.   Labs from 11/10/2020: White blood cell count 8000, hemoglobin 12.3, hematocrit 38.3, MCV 93.2, platelets 115,000, BUN 10, creatinine 1.42, potassium 4.9, sodium 141, glucose 153, albumin 2.2, total bilirubin 0.2, AST 14, ALT 20, alkaline phosphatase 59.  Labs from November 05, 2020: Creatinine 1.54, potassium 6.5, hemoglobin 13.4, hematocrit 42, white blood cell count 9190, platelets 162,000, B12 3269, free T4 0.65  Patient reports PCP concerned because his hemoglobin dropped from 13.4-12.3 in 1 week.    Pertinent GI history: Previous work-up included negative celiac screen, negative stool studies.  He has a history of osteoporosis and B12 deficiency.  Status post cholecystectomy, hemigastrectomy in the [employer] for benign tumor, numerous small bowel resections for abdominal adhesions and bowel obstruction, and subtotal colectomy for colon cancer. Total of 16 abdominal surgeries per patient. EGD and colonoscopy in 2018 as outlined below.  Stool pancreatic elastase came back low at 28. He was started on Creon.   EGD December 2021 as outlined above.   EGD in August 2018 with reflux esophagitis, small hiatal hernia status post hemigastrectomy. Single patent efferent small bowel ileum appeared normal, 2 x 2 centimeter tongue of salmon epithelium again seen, esophageal dilation.  Biopsies consistent with reflux changes, not Barrett's however this was confirmed on prior EGDs, patient will be offered EGD in August 2021.   Colonoscopy August 2018 status post subtotal colectomy with normal-appearing  residual lower GI tract.  Next colonoscopy in 5 years.   2 recent admissions and [facility] back in July 2020.  First 1 for suspected small bowel obstruction. He had a  colonoscopy July 7 showing prior end-to-end colocolonic anastomosis in the rectosigmoid colon.  This was patent and was characterized by healthy-appearing mucosa.  The anastomosis was traversed.  The terminal ileum appeared normal.  Prep was inadequate to qualify for colorectal cancer surveillance.  Small bowel obstruction treated conservatively and patient was discharged.     Patient underwent resection of the right colon and terminal ileum with creation of end to end ileal rectal anastomosis on October 12, 2018 for large bowel obstruction.  Work-up at admission revealed patent colorectal anastomosis with short segment of cecum and ascending colon attached to the rectum. It was felt that he had a dysfunctional cecum causing incomplete emptying and was massively distended on CT.    Current Outpatient Medications  Medication Sig Dispense Refill   ammonium lactate (AMLACTIN) 12 % cream Apply 1 g topically See admin instructions. APPLY CREAM TO DRY SKIN ON LEGS ARMS HANDS AND BACK AREA ONCE TO TWICE DAILY (AVOID FACE GROIN UNDERARMS)  3   atorvastatin (LIPITOR) 20 MG tablet Take 20 mg by mouth at bedtime.      baclofen (LIORESAL) 10 MG tablet as needed.     carvedilol (COREG) 25 MG tablet Take 0.5 tablets (12.5 mg total) by mouth 2 (two) times daily with a meal. (Patient taking differently: Take 25 mg by mouth 2 (two) times daily with a meal. Takes 25 mg 2 times daily.) 15 tablet 2   Cholecalciferol (VITAMIN D) 2000 units CAPS Take 2,000 Units by mouth daily.     diphenoxylate-atropine (LOMOTIL) 2.5-0.025 MG tablet Take 1 tablet by mouth 4 times daily 120 tablet 3   enoxaparin (LOVENOX) 60 MG/0.6ML injection Inject 60 mg into the skin daily.     Ferrous Sulfate (IRON) 325 (65 Fe) MG TABS Take by mouth every other day.     gabapentin (NEURONTIN) 100 MG capsule Take 1 capsule by mouth at bedtime 30 capsule 6   lipase/protease/amylase (CREON) 36000 UNITS CPEP capsule Take 3 capsules with meals and 2 capsule  with snacks. 900 capsule 3   loperamide (IMODIUM A-D) 2 MG tablet Take 2 mg by mouth in the morning and at bedtime.     methylPREDNISolone (MEDROL DOSEPAK) 4 MG TBPK tablet daily.     mirtazapine (REMERON) 30 MG tablet TAKE 1 TABLET BY MOUTH AT BEDTIME 30 tablet 3   Multiple Vitamin (MULTIVITAMIN WITH MINERALS) TABS tablet Take 1 tablet by mouth daily.     Multiple Vitamins-Minerals (OCUVITE EYE HEALTH FORMULA PO) Take 1 tablet by mouth daily.     Nutritional Supplements (ENSURE PLUS HN) LIQD Take 1 Bottle daily by mouth.      ondansetron (ZOFRAN-ODT) 4 MG disintegrating tablet TAKE 1 TABLET BY MOUTH EVERY 4 HOURS AS NEEDED FOR NAUSEA AND VOMITING (Patient taking differently: as needed. TAKE 1 TABLET BY MOUTH EVERY 4 HOURS AS NEEDED FOR NAUSEA AND VOMITING) 30 tablet 1   pantoprazole (PROTONIX) 40 MG tablet Take 1 tablet (40 mg total) by mouth 2 (two) times daily before a meal. 180 tablet 3   PREVALITE 4 g packet Take 1 packet (4 g total) by mouth daily. DO NOT TAKE WITHIN 2 HOURS OF OTHER MEDICATIONS 90 packet 3   sucralfate (CARAFATE) 1 GM/10ML suspension TAKE 10 MLS (1 G TOTAL) BY MOUTH 4 (FOUR) TIMES DAILY  AS NEEDED. FOR BREAKTHROUGH HEARTBURN 420 mL 5   traMADol (ULTRAM) 50 MG tablet Take 50 mg by mouth daily as needed for moderate pain.      zoledronic acid (RECLAST) 5 MG/100ML SOLN injection Inject 5 mg into the vein See admin instructions. Pt gets a dose yearly. Due again 2018     No current facility-administered medications for this visit.   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)    Iron deficiency anemia 03/23/2016   Obstruction of bowel ([employer]) 03/03/14   Osteoporosis    Personal history of PE (pulmonary embolism) 10/01/2010   Pneumonia    Pulmonary embolism ([place]) 02/2010   Recurrent upper respiratory infection (URI)    Renal disorder    S/P endoscopy September 28, 2010   erosive reflux esophagitis, Billroth I anatomy   S/P partial gastrectomy 1980s   Seizures ([place])    was on meds for 6 months, Unknown etiology, last seizure was 2017.   Shortness of breath    TIA (transient ischemic attack) 10/11   Vitamin B12 deficiency    Past Surgical History:  Procedure Laterality Date   ABDOMINAL ADHESION SURGERY  03/04/15   @ [facility]   ABDOMINAL EXPLORATION SURGERY     abdominal sugery     for bowel obstruction x 8, all in 1980s, except for one in 07/2010   APPENDECTOMY  1980s   Billroth 1 hemigastrectomy  1980s   per patient for benign duodenal tumor   CARDIAC CATHETERIZATION  07/17/2012   CHOLECYSTECTOMY  1980s   COLON SURGERY  May 2012   left hemicolectomy, colon cancer found at time of surgery for bowel obstruction   COLON SURGERY  10/12/2018   At [facility]: Resection of right colon and terminal ileum with creation of ileorectal anastomosis for large bowel obstruction.  Cecum and ascending colon noted to be attached to the rectum.   COLONOSCOPY  03/18/2011   anastomosis at 35cm. Several adenomatous polyps removed. Sigmoid diverticulosis. Next TCS 02/2013   COLONOSCOPY N/A 07/24/2012   EZM:OQHUTM post segmental resection with normal-appearing colonic anastomosis aside  from an adjacent polyp-removed as described above. Rectal polyp-removed as described above. CT findings appear to have been artifactual. tubular adenomas/prolapsed type polyp.   COLONOSCOPY N/A 05/15/2015   Procedure: COLONOSCOPY;  Surgeon: [name], MD;  [facility];  Service: Endoscopy;  Laterality: N/A;   COLONOSCOPY  09/26/2018   at [facility]: prep not adequate for colon cancer surveillance.  Prior  end-to-end colonic anastomosis in the rectosigmoid region.  This was patent and characterized by healthy-appearing mucosa.  Anastomosis was traversed.  Normal terminal ileum.   COLONOSCOPY WITH PROPOFOL N/A 11/04/2016   Dr. [name], status post subtotal colectomy with normal-appearing residual lower GI tract.  Next colonoscopy in 5 years.   ESOPHAGOGASTRODUODENOSCOPY  09/28/2010   ESOPHAGOGASTRODUODENOSCOPY  12/01/2010   Cervical web status post dilation, erosive esophagitis, B1 hemigastrectomy, inflamed anastomosis   ESOPHAGOGASTRODUODENOSCOPY  04/16/2011   excoriation at [facility] c/w trauma/M-W tear, friable gastric anastomosis, dilation efferent limb   ESOPHAGOGASTRODUODENOSCOPY N/A 06/03/2014   Dr.[name]- cervcal esopphageal web s/p dilation. abnormal distal esophagus bx= barretts esophagus   ESOPHAGOGASTRODUODENOSCOPY N/A 05/15/2015   Procedure: ESOPHAGOGASTRODUODENOSCOPY (EGD);  Surgeon: [name], MD;  [facility];  Service: Endoscopy;  Laterality: N/A;  230   ESOPHAGOGASTRODUODENOSCOPY (EGD) WITH ESOPHAGEAL DILATION  02/25/2012   [id number] esophageal web-s/p dilation anddisruption as described above. Status post prior gastric with Billroth I configuration. Abnormal gastric mucosa at the anastomosis. Gastric biopsy showed mild chronic inflammation but no H. pylori    ESOPHAGOGASTRODUODENOSCOPY (EGD) WITH PROPOFOL N/A 11/04/2016   Dr. [name]: Reflux esophagitis, small hiatal hernia status post hemigastrectomy.  Single patent efferent small bowel limb appeared normal, 2 x 2  centimeter tongue of salmon epithelium again seen, esophageal dilation.  Biopsies consistent with reflux changes, not Barrett's however this was confirmed on prior EGDs.  Offer 3-year follow-up EGD August 2021.   ESOPHAGOGASTRODUODENOSCOPY (EGD) WITH PROPOFOL N/A 03/03/2020   Procedure: ESOPHAGOGASTRODUODENOSCOPY (EGD) WITH PROPOFOL;  Surgeon: [name], MD;  [facility];  Service: Endoscopy;  Laterality: N/A;  3:00pm   HERNIA REPAIR     right inguinal   MALONEY DILATION N/A 06/03/2014   Procedure: [name] DILATION;  Surgeon: [name], MD;  [facility];  Service: Endoscopy;  Laterality: N/A;   MALONEY DILATION N/A 05/15/2015   Procedure: [name] DILATION;  Surgeon: [name], MD;  [facility];  Service: Endoscopy;  Laterality: N/A;   MALONEY DILATION N/A 11/04/2016   Procedure: [name] DILATION;  Surgeon: [name], MD;  [facility];  Service: Endoscopy;  Laterality: N/A;   MALONEY DILATION N/A 03/03/2020   Procedure: [name] DILATION;  Surgeon: [name], MD;  [facility];  Service: Endoscopy;  Laterality: N/A;   PORTACATH PLACEMENT     SAVORY DILATION N/A 06/03/2014   Procedure: SAVORY DILATION;  Surgeon: [name], MD;  [facility];  Service: Endoscopy;  Laterality: N/A;   Family History  Problem Relation Age of Onset   Hypertension Mother    Arthritis Mother    Pneumonia Mother    Hypertension Father    Heart attack Father    Colon cancer Neg Hx    Social History   Tobacco Use   Smoking status: Former    Packs/day: 0.50    Years: 40.00    Pack years: 20.00    Types: Cigarettes    Quit date: 12/20/2012    Years since quitting: 7.8   Smokeless tobacco: Never  Vaping Use   Vaping Use: Never used  Substance Use Topics   Alcohol use: No   Drug use: No    ROS:  General: Negative for anorexia,  fever, chills, fatigue, weakness. See hpi ENT: Negative for hoarseness,  difficulty swallowing , nasal congestion. CV: Negative for chest pain, angina, palpitations, dyspnea on exertion, peripheral edema.  Respiratory: Negative for dyspnea  at rest, dyspnea on exertion, cough, sputum, wheezing.  GI: See history of present illness. GU:  Negative for dysuria, hematuria, urinary incontinence, urinary frequency, nocturnal urination.  Endo: see hpi   Physical Examination:   BP 121/71   Pulse 67   Temp (!) 97.5 F (36.4 C) (Temporal)   Ht 5\' 9"  (1.753 m)   Wt 133 lb 3.2 oz (60.4 kg)   BMI 19.67 kg/m   General: thin, well-developed in no acute distress.  Eyes: No icterus. Mouth: masked. Lungs: Clear to auscultation bilaterally.  Heart: Regular rate and rhythm, no murmurs rubs or gallops.  Abdomen: Bowel sounds are normal, nontender, nondistended, no hepatosplenomegaly or masses, no abdominal bruits or hernia , no rebound or guarding.   Extremities: No lower extremity edema. No clubbing or deformities. Neuro: Alert and oriented x 4   Skin: Warm and dry, no jaundice.   Psych: Alert and cooperative, normal mood and affect.  Labs:  Lab Results  Component Value Date   WBC 9.2 05/28/2020   HGB 13.8 05/28/2020   HCT 43.0 05/28/2020   MCV 93.9 05/28/2020   PLT 119 (L) 05/28/2020   Lab Results  Component Value Date   CREATININE 1.53 (H) 05/28/2020   BUN 14 05/28/2020   NA 138 05/28/2020   K 4.1 05/28/2020   CL 111 05/28/2020   CO2 19 (L) 05/28/2020   Lab Results  Component Value Date   IRON 71 05/28/2020   TIBC 299 05/28/2020   FERRITIN 64 05/28/2020   Lab Results  Component Value Date   VITAMINB12 4,294 (H) 11/22/2019   Lab Results  Component Value Date   FOLATE 50.8 08/16/2018   Lab Results  Component Value Date   ALT 20 05/28/2020   AST 18 05/28/2020   ALKPHOS 65 05/28/2020   BILITOT 0.5 05/28/2020     Imaging Studies: No results found.   Assessment: Very pleasant but complicated 70 year old male with history of chronic diarrhea  weight loss, recurrent bowel obstructions.  History of stage III colon cancer with multiple abdominal surgeries (approximately 16) due to prior colon cancer, recurrent small bowel obstructions, benign gastric tumor as outlined above.  Most recent surgery in 2020, end to end ileorectal anastomosis.  Anemia: Presents for follow-up due to concern for losing blood.  Recent drop in hemoglobin from 13.4->12.2 in 1 week without overt bleeding, noted by PCP.  He has a history of iron deficiency and has required iron infusions in the past but none recently, followed by hematology.  History of B12 deficiency has been on supplement chronically, current B12 level over 3000.  Given history of gastric surgery and resection of terminal ileum he is at risk of developing B12 deficiency.  Hemoccult status unknown.  Last colonoscopy September 26, 2018 prior to his intake and ileorectal anastomosis.  Last EGD December 2021 with surgically altered stomach with a single eferrent small bowel ileum, residual stomach appeared normal, two 1 cm tongues of salmon-colored epithelium at the GE junction with prior diagnosis of Barrett's, current pathology with findings most consistent with reflux changes.  3-year surveillance EGD planned.  Diarrhea: Likely multifactorial in the setting of chronic pancreatic insufficiency, multiple bowel resections, removal of cecum/entire colon.  Watery stool every time he eats.  He continues Creon, Lomotil, Prevalite, Imodium as outlined above.  He has dropped little over 10 pounds since we last saw him.  Also reports hospitalization back in spring for recurrent bowel obstruction treated conservatively.  GERD: Short segment Barrett's esophagus, biopsy-proven  in the past but most recent EGD biopsies most consistent with reflux.  Plan for surveillance EGD 2024.  History of colon cancer: Stage III status post multiple surgeries as outlined.  Surveillance study planned in 2023 although will be limited as he has  ileorectal anastomosis.   Plan: I FOBT Obtain copy of records from March 2022 hospitalization at [facility] He will call if he develops black or bloody stools. Further recommendations once records reviewed and discussed with Dr. [name].

## 2020-11-11 NOTE — Patient Instructions (Addendum)
Please complete stool test and send back or bring back to the office.  We will obtain copy of records from 05/2020 hospitalization. We will be in touch with further recommendations once I review with Dr. [name].  Please call if you see black or bloody stools.

## 2020-11-13 ENCOUNTER — Other Ambulatory Visit (HOSPITAL_COMMUNITY): Payer: Self-pay | Admitting: Hematology

## 2020-11-13 DIAGNOSIS — C189 Malignant neoplasm of colon, unspecified: Secondary | ICD-10-CM

## 2020-11-13 DIAGNOSIS — R630 Anorexia: Secondary | ICD-10-CM

## 2020-11-17 ENCOUNTER — Telehealth: Payer: Self-pay | Admitting: Gastroenterology

## 2020-11-17 NOTE — Telephone Encounter (Signed)
Records reviewed from [facility] admission 06/17/20-06/21/20.  Patient admitted with partial SBO treated conservatively.   CT abdomen and pelvis dated 06/17/2020: Extrahepatic biliary ductal dilatation measuring 14 mm stable, pancreas unremarkable, major mesenteric vessels appear to be intact, dilated loops of small bowel measuring up to 5.2 cm.  LFTs were normal.  Hemoglobin 14.6.  Platelets initially 151,000, dropped to 119,000.    Please let pt know: Complete ifobt as provided during OV last week. We will wait for labs planned with Dr. Delton Coombes next month to follow up on anemia. Please monitor weight for any additional weight loss. Consider adding Ensure or Boost once daily for additional calories.  Once stool result and labs are available, we will provide additional recommendations.

## 2020-11-17 NOTE — Telephone Encounter (Signed)
Pt was made aware and verbalized understanding.  

## 2020-11-18 ENCOUNTER — Other Ambulatory Visit: Payer: Self-pay

## 2020-11-18 ENCOUNTER — Ambulatory Visit (INDEPENDENT_AMBULATORY_CARE_PROVIDER_SITE_OTHER): Payer: Self-pay | Admitting: Gastroenterology

## 2020-11-18 ENCOUNTER — Other Ambulatory Visit: Payer: Self-pay | Admitting: Gastroenterology

## 2020-11-18 DIAGNOSIS — R197 Diarrhea, unspecified: Secondary | ICD-10-CM

## 2020-11-18 DIAGNOSIS — D649 Anemia, unspecified: Secondary | ICD-10-CM

## 2020-11-18 LAB — IFOBT (OCCULT BLOOD): IFOBT: NEGATIVE

## 2020-11-27 ENCOUNTER — Inpatient Hospital Stay (HOSPITAL_COMMUNITY): Payer: Medicare Other | Attending: Hematology

## 2020-11-27 ENCOUNTER — Other Ambulatory Visit: Payer: Self-pay

## 2020-11-27 ENCOUNTER — Encounter (HOSPITAL_COMMUNITY): Payer: Self-pay

## 2020-11-27 DIAGNOSIS — E559 Vitamin D deficiency, unspecified: Secondary | ICD-10-CM | POA: Insufficient documentation

## 2020-11-27 DIAGNOSIS — Z79899 Other long term (current) drug therapy: Secondary | ICD-10-CM | POA: Insufficient documentation

## 2020-11-27 DIAGNOSIS — G620 Drug-induced polyneuropathy: Secondary | ICD-10-CM | POA: Diagnosis not present

## 2020-11-27 DIAGNOSIS — F17210 Nicotine dependence, cigarettes, uncomplicated: Secondary | ICD-10-CM | POA: Insufficient documentation

## 2020-11-27 DIAGNOSIS — C186 Malignant neoplasm of descending colon: Secondary | ICD-10-CM | POA: Diagnosis present

## 2020-11-27 DIAGNOSIS — N189 Chronic kidney disease, unspecified: Secondary | ICD-10-CM | POA: Insufficient documentation

## 2020-11-27 DIAGNOSIS — E611 Iron deficiency: Secondary | ICD-10-CM | POA: Diagnosis not present

## 2020-11-27 DIAGNOSIS — Z9049 Acquired absence of other specified parts of digestive tract: Secondary | ICD-10-CM | POA: Diagnosis not present

## 2020-11-27 DIAGNOSIS — T451X5A Adverse effect of antineoplastic and immunosuppressive drugs, initial encounter: Secondary | ICD-10-CM | POA: Insufficient documentation

## 2020-11-27 DIAGNOSIS — R911 Solitary pulmonary nodule: Secondary | ICD-10-CM | POA: Insufficient documentation

## 2020-11-27 DIAGNOSIS — C189 Malignant neoplasm of colon, unspecified: Secondary | ICD-10-CM

## 2020-11-27 LAB — CBC WITH DIFFERENTIAL/PLATELET
Abs Immature Granulocytes: 0.04 10*3/uL (ref 0.00–0.07)
Basophils Absolute: 0 10*3/uL (ref 0.0–0.1)
Basophils Relative: 0 %
Eosinophils Absolute: 0.1 10*3/uL (ref 0.0–0.5)
Eosinophils Relative: 1 %
HCT: 37.3 % — ABNORMAL LOW (ref 39.0–52.0)
Hemoglobin: 12.2 g/dL — ABNORMAL LOW (ref 13.0–17.0)
Immature Granulocytes: 1 %
Lymphocytes Relative: 30 %
Lymphs Abs: 2.4 10*3/uL (ref 0.7–4.0)
MCH: 30.3 pg (ref 26.0–34.0)
MCHC: 32.7 g/dL (ref 30.0–36.0)
MCV: 92.8 fL (ref 80.0–100.0)
Monocytes Absolute: 0.7 10*3/uL (ref 0.1–1.0)
Monocytes Relative: 8 %
Neutro Abs: 4.7 10*3/uL (ref 1.7–7.7)
Neutrophils Relative %: 60 %
Platelets: 115 10*3/uL — ABNORMAL LOW (ref 150–400)
RBC: 4.02 MIL/uL — ABNORMAL LOW (ref 4.22–5.81)
RDW: 15 % (ref 11.5–15.5)
WBC: 8 10*3/uL (ref 4.0–10.5)
nRBC: 0 % (ref 0.0–0.2)

## 2020-11-27 LAB — COMPREHENSIVE METABOLIC PANEL
ALT: 45 U/L — ABNORMAL HIGH (ref 0–44)
AST: 39 U/L (ref 15–41)
Albumin: 2.7 g/dL — ABNORMAL LOW (ref 3.5–5.0)
Alkaline Phosphatase: 56 U/L (ref 38–126)
Anion gap: 5 (ref 5–15)
BUN: 11 mg/dL (ref 8–23)
CO2: 18 mmol/L — ABNORMAL LOW (ref 22–32)
Calcium: 7.8 mg/dL — ABNORMAL LOW (ref 8.9–10.3)
Chloride: 115 mmol/L — ABNORMAL HIGH (ref 98–111)
Creatinine, Ser: 1.4 mg/dL — ABNORMAL HIGH (ref 0.61–1.24)
GFR, Estimated: 54 mL/min — ABNORMAL LOW (ref >60)
Glucose, Bld: 99 mg/dL (ref 70–99)
Potassium: 4.4 mmol/L (ref 3.5–5.1)
Sodium: 138 mmol/L (ref 135–145)
Total Bilirubin: 0.6 mg/dL (ref 0.3–1.2)
Total Protein: 5.2 g/dL — ABNORMAL LOW (ref 6.5–8.1)

## 2020-11-27 LAB — IRON AND TIBC
Iron: 129 ug/dL (ref 45–182)
Saturation Ratios: 53 % — ABNORMAL HIGH (ref 17.9–39.5)
TIBC: 243 ug/dL — ABNORMAL LOW (ref 250–450)
UIBC: 114 ug/dL

## 2020-11-27 LAB — FERRITIN: Ferritin: 140 ng/mL (ref 24–336)

## 2020-11-27 MED ORDER — SODIUM CHLORIDE 0.9% FLUSH
10.0000 mL | Freq: Once | INTRAVENOUS | Status: AC
  Administered 2020-11-27: 10 mL via INTRAVENOUS

## 2020-11-27 MED ORDER — HEPARIN SOD (PORK) LOCK FLUSH 100 UNIT/ML IV SOLN
500.0000 [IU] | Freq: Once | INTRAVENOUS | Status: AC
  Administered 2020-11-27: 500 [IU] via INTRAVENOUS

## 2020-11-27 NOTE — Patient Instructions (Signed)
[facility]  Discharge Instructions: Thank you for choosing [facility] to provide your oncology and hematology care.  If you have a lab appointment with the Cancer Center, please come in thru the Main Entrance and check in at the main information desk.  Wear comfortable clothing and clothing appropriate for easy access to any Portacath or PICC line.   We strive to give you quality time with your provider. You may need to reschedule your appointment if you arrive late (15 or more minutes).  Arriving late affects you and other patients whose appointments are after yours.  Also, if you miss three or more appointments without notifying the office, you may be dismissed from the clinic at the provider's discretion.      For prescription refill requests, have your pharmacy contact our office and allow 72 hours for refills to be completed.        To help prevent nausea and vomiting after your treatment, we encourage you to take your nausea medication as directed.  BELOW ARE SYMPTOMS THAT SHOULD BE REPORTED IMMEDIATELY: *FEVER GREATER THAN 100.4 F (38 C) OR HIGHER *CHILLS OR SWEATING *NAUSEA AND VOMITING THAT IS NOT CONTROLLED WITH YOUR NAUSEA MEDICATION *UNUSUAL SHORTNESS OF BREATH *UNUSUAL BRUISING OR BLEEDING *URINARY PROBLEMS (pain or burning when urinating, or frequent urination) *BOWEL PROBLEMS (unusual diarrhea, constipation, pain near the anus) TENDERNESS IN MOUTH AND THROAT WITH OR WITHOUT PRESENCE OF ULCERS (sore throat, sores in mouth, or a toothache) UNUSUAL RASH, SWELLING OR PAIN  UNUSUAL VAGINAL DISCHARGE OR ITCHING   Items with * indicate a potential emergency and should be followed up as soon as possible or go to the Emergency Department if any problems should occur.  Please show the CHEMOTHERAPY ALERT CARD or IMMUNOTHERAPY ALERT CARD at check-in to the Emergency Department and triage nurse.  Should you have questions after your visit or need to cancel  or reschedule your appointment, please contact [facility] [phone]  and follow the prompts.  Office hours are 8:00 a.m. to 4:30 p.m. Monday - Friday. Please note that voicemails left after 4:00 p.m. may not be returned until the following business day.  We are closed weekends and major holidays. You have access to a nurse at all times for urgent questions. Please call the main number to the clinic [phone] and follow the prompts.  For any non-urgent questions, you may also contact your provider using MyChart. We now offer e-Visits for anyone 18 and older to request care online for non-urgent symptoms. For details visit [email or web address].   Also download the MyChart app! Go to the app store, search "MyChart", open the app, select [facility], and log in with your MyChart username and password.  Due to Covid, a mask is required upon entering the hospital/clinic. If you do not have a mask, one will be given to you upon arrival. For doctor visits, patients may have 1 support person aged 18 or older with them. For treatment visits, patients cannot have anyone with them due to current Covid guidelines and our immunocompromised population.  

## 2020-11-27 NOTE — Progress Notes (Signed)
Patients port flushed without difficulty.  Good blood return noted with no bruising or swelling noted at site.  Band aid applied.  VSS with discharge and left in satisfactory condition with no s/s of distress noted.   

## 2020-11-28 LAB — CEA: CEA: 2.8 ng/mL (ref 0.0–4.7)

## 2020-12-04 ENCOUNTER — Other Ambulatory Visit: Payer: Self-pay

## 2020-12-04 ENCOUNTER — Inpatient Hospital Stay (HOSPITAL_BASED_OUTPATIENT_CLINIC_OR_DEPARTMENT_OTHER): Payer: Medicare Other | Admitting: Hematology

## 2020-12-04 VITALS — BP 103/70 | HR 75 | Temp 97.0°F | Resp 18 | Wt 133.2 lb

## 2020-12-04 DIAGNOSIS — C189 Malignant neoplasm of colon, unspecified: Secondary | ICD-10-CM

## 2020-12-04 DIAGNOSIS — C186 Malignant neoplasm of descending colon: Secondary | ICD-10-CM | POA: Diagnosis not present

## 2020-12-04 NOTE — Patient Instructions (Addendum)
[place] at [facility] Discharge Instructions  You were seen today by Dr. Delton Coombes. He went over your recent results and scans. Dr. Delton Coombes will see you back in 2 months for labs and follow up.   Thank you for choosing [place] at [facility] to provide your oncology and hematology care.  To afford each patient quality time with our provider, please arrive at least 15 minutes before your scheduled appointment time.   If you have a lab appointment with the [facility] please come in thru the Main Entrance and check in at the main information desk  You need to re-schedule your appointment should you arrive 10 or more minutes late.  We strive to give you quality time with our providers, and arriving late affects you and other patients whose appointments are after yours.  Also, if you no show three or more times for appointments you may be dismissed from the clinic at the providers discretion.     Again, thank you for choosing [facility]  Our hope is that these requests will decrease the amount of time that you wait before being seen by our physicians.       _____________________________________________________________  Should you have questions after your visit to [facility], please contact our office at [phone] between the hours of 8:00 a.m. and 4:30 p.m.  Voicemails left after 4:00 p.m. will not be returned until the following business day.  For prescription refill requests, have your pharmacy contact our office and allow 72 hours.    Cancer Center Support Programs:   > Cancer Support Group  2nd Tuesday of the month 1pm-2pm, Journey Room

## 2020-12-04 NOTE — Progress Notes (Signed)
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 Left hemicolectomy on 07/2010. 2. FOLFOX x 7 cycles from 09/28/2010 to 01/04/2011 stopped due to neuropathy.  NGS Results: not done  CURRENT THERAPY: Surveillance; intermittent Feraheme last on 09/05/2018  BRIEF ONCOLOGIC HISTORY:  Oncology History   No history exists.    CANCER STAGING: Cancer Staging Adenocarcinoma of colon with mucinous features Staging form: Colon and Rectum, AJCC 7th Edition - Clinical: Stage IIIB (T3, N1, M0) - Signed by Baird Cancer, PA on 09/22/2010   INTERVAL HISTORY:  [name], a 70 y.o. 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 [name] was last seen on 06/04/2020.   Today he reports feeling well. He has lost 15 pounds in 7 months. He denies any bleeding, black stools, and he reports normal BM.  His appetite is fair.   REVIEW OF SYSTEMS:  Review of Systems  Constitutional:  Positive for appetite change (40%), fatigue (40%) and unexpected weight change (-15 lbs).  HENT:   Negative for nosebleeds.   Respiratory:  Negative for hemoptysis.   Gastrointestinal:  Negative for blood in stool, constipation and diarrhea.  Genitourinary:  Negative for hematuria.   Neurological:  Positive for numbness (tingling fingers).  Hematological:  Does not bruise/bleed easily.  All other systems reviewed and are negative.  PAST MEDICAL/SURGICAL HISTORY:  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   Stage 3   Anemia    Anxiety    Arthritis    Barrett's esophagus    Blood transfusion    Bowel obstruction ([place]) 05/13/2012   Recurrent   Bronchitis    Chest pain  at rest    Chronic abdominal pain    Erosive esophagitis    ETOH abuse    quit 03/2010   GERD (gastroesophageal reflux disease)    Hx of Clostridium difficile infection 01/2012   Hypertension    Ileus ([place])    Iron deficiency anemia 03/23/2016   Obstruction of bowel ([place]) 03/03/14   Osteoporosis    Personal history of PE (pulmonary embolism) 10/01/2010   Pneumonia    Pulmonary embolism ([place]) 02/2010   Recurrent upper respiratory infection (URI)    Renal disorder    S/P endoscopy September 28, 2010   erosive reflux esophagitis, Billroth I anatomy   S/P partial gastrectomy 1980s   Seizures ([place])    was on meds for 6 months, Unknown etiology, last seizure was 2017.   Shortness of breath    TIA (transient ischemic attack) 10/11   Vitamin B12 deficiency    Past Surgical History:  Procedure Laterality Date   ABDOMINAL ADHESION SURGERY  03/04/15   @ [facility]   ABDOMINAL EXPLORATION SURGERY     abdominal sugery     for bowel obstruction x 8, all in 1980s, except for one in 07/2010   APPENDECTOMY  1980s   Billroth 1 hemigastrectomy  1980s   per patient for benign duodenal tumor   CARDIAC CATHETERIZATION  07/17/2012   CHOLECYSTECTOMY  1980s   COLON SURGERY  May 2012   left hemicolectomy, colon cancer found at time of surgery for bowel obstruction   COLON  SURGERY  10/12/2018   At [facility]: Resection of right colon and terminal ileum with creation of ileorectal anastomosis for large bowel obstruction.  Cecum and ascending colon noted to be attached to the rectum.   COLONOSCOPY  03/18/2011   anastomosis at 35cm. Several adenomatous polyps removed. Sigmoid diverticulosis. Next TCS 02/2013   COLONOSCOPY N/A 07/24/2012   ZTI:[id number] post segmental resection with normal-appearing colonic anastomosis aside from an adjacent polyp-removed as described above. Rectal polyp-removed as described above. CT findings appear to have been artifactual. tubular adenomas/prolapsed type polyp.   COLONOSCOPY N/A 05/15/2015    Procedure: COLONOSCOPY;  Surgeon: [name], MD;  [facility];  Service: Endoscopy;  Laterality: N/A;   COLONOSCOPY  09/26/2018   at [facility]: prep not adequate for colon cancer surveillance.  Prior end-to-end colonic anastomosis in the rectosigmoid region.  This was patent and characterized by healthy-appearing mucosa.  Anastomosis was traversed.  Normal terminal ileum.   COLONOSCOPY WITH PROPOFOL N/A 11/04/2016   Dr. [name], status post subtotal colectomy with normal-appearing residual lower GI tract.  Next colonoscopy in 5 years.   ESOPHAGOGASTRODUODENOSCOPY  09/28/2010   ESOPHAGOGASTRODUODENOSCOPY  12/01/2010   Cervical web status post dilation, erosive esophagitis, B1 hemigastrectomy, inflamed anastomosis   ESOPHAGOGASTRODUODENOSCOPY  04/16/2011   excoriation at [facility] c/w trauma/M-W tear, friable gastric anastomosis, dilation efferent limb   ESOPHAGOGASTRODUODENOSCOPY N/A 06/03/2014   Dr.[name]- cervcal esopphageal web s/p dilation. abnormal distal esophagus bx= barretts esophagus   ESOPHAGOGASTRODUODENOSCOPY N/A 05/15/2015   Procedure: ESOPHAGOGASTRODUODENOSCOPY (EGD);  Surgeon: [name], MD;  [facility];  Service: Endoscopy;  Laterality: N/A;  230   ESOPHAGOGASTRODUODENOSCOPY (EGD) WITH ESOPHAGEAL DILATION  02/25/2012   IPJ:[id number] esophageal web-s/p dilation anddisruption as described above. Status post prior gastric with Billroth I configuration. Abnormal gastric mucosa at the anastomosis. Gastric biopsy showed mild chronic inflammation but no H. pylori    ESOPHAGOGASTRODUODENOSCOPY (EGD) WITH PROPOFOL N/A 11/04/2016   Dr. [name]: Reflux esophagitis, small hiatal hernia status post hemigastrectomy.  Single patent efferent small bowel limb appeared normal, 2 x 2 centimeter tongue of salmon epithelium again seen, esophageal dilation.  Biopsies consistent with reflux changes, not Barrett's however this was confirmed on prior EGDs.  Offer 3-year follow-up EGD August 2021.    ESOPHAGOGASTRODUODENOSCOPY (EGD) WITH PROPOFOL N/A 03/03/2020   Procedure: ESOPHAGOGASTRODUODENOSCOPY (EGD) WITH PROPOFOL;  Surgeon: [name], MD;  [facility];  Service: Endoscopy;  Laterality: N/A;  3:00pm   HERNIA REPAIR     right inguinal   MALONEY DILATION N/A 06/03/2014   Procedure: [name] DILATION;  Surgeon: [name], MD;  [facility];  Service: Endoscopy;  Laterality: N/A;   MALONEY DILATION N/A 05/15/2015   Procedure: [name] DILATION;  Surgeon: [name], MD;  [facility];  Service: Endoscopy;  Laterality: N/A;   MALONEY DILATION N/A 11/04/2016   Procedure: [name] DILATION;  Surgeon: [name], MD;  [facility];  Service: Endoscopy;  Laterality: N/A;   MALONEY DILATION N/A 03/03/2020   Procedure: [name] DILATION;  Surgeon: [name], MD;  [facility];  Service: Endoscopy;  Laterality: N/A;   PORTACATH PLACEMENT     SAVORY DILATION N/A 06/03/2014   Procedure: SAVORY DILATION;  Surgeon: [name], MD;  [facility];  Service: Endoscopy;  Laterality: N/A;    SOCIAL HISTORY:  Social History   Socioeconomic History   Marital status: Married    Spouse name: Not  on file   Number of children: 3   Years of education: Not on file   Highest education level: Not on file  Occupational History    Employer: [employer]  Tobacco Use   Smoking status: Former    Packs/day: 0.50    Years: 40.00    Pack years: 20.00    Types: Cigarettes    Quit date: 12/20/2012    Years since quitting: 7.9   Smokeless tobacco: Never  Vaping Use   Vaping Use: Never used  Substance and Sexual Activity   Alcohol use: No   Drug use: No   Sexual activity: Never  Other Topics Concern   Not on file  Social History Narrative   Not on file   Social Determinants of Health   Financial Resource Strain: Not on file  Food Insecurity: Not on file  Transportation Needs: Not on file  Physical Activity:  Not on file  Stress: Not on file  Social Connections: Not on file  Intimate Partner Violence: Not on file    FAMILY HISTORY:  Family History  Problem Relation Age of Onset   Hypertension Mother    Arthritis Mother    Pneumonia Mother    Hypertension Father    Heart attack Father    Colon cancer Neg Hx     CURRENT MEDICATIONS:  Current Outpatient Medications  Medication Sig Dispense Refill   ammonium lactate (AMLACTIN) 12 % cream Apply 1 g topically See admin instructions. APPLY CREAM TO DRY SKIN ON LEGS ARMS HANDS AND BACK AREA ONCE TO TWICE DAILY (AVOID FACE GROIN UNDERARMS)  3   atorvastatin (LIPITOR) 20 MG tablet Take 20 mg by mouth at bedtime.      baclofen (LIORESAL) 10 MG tablet as needed.     carvedilol (COREG) 25 MG tablet Take 0.5 tablets (12.5 mg total) by mouth 2 (two) times daily with a meal. (Patient taking differently: Take 25 mg by mouth 2 (two) times daily with a meal. Takes 25 mg 2 times daily.) 15 tablet 2   Cholecalciferol (VITAMIN D) 2000 units CAPS Take 2,000 Units by mouth daily.     diphenoxylate-atropine (LOMOTIL) 2.5-0.025 MG tablet Take 1 tablet by mouth 4 times daily 120 tablet 3   enoxaparin (LOVENOX) 60 MG/0.6ML injection Inject 60 mg into the skin daily.     Ferrous Sulfate (IRON) 325 (65 Fe) MG TABS Take by mouth every other day.     gabapentin (NEURONTIN) 100 MG capsule Take 1 capsule by mouth at bedtime 30 capsule 6   lipase/protease/amylase (CREON) 36000 UNITS CPEP capsule Take 3 capsules with meals and 2 capsule with snacks. 900 capsule 3   loperamide (IMODIUM A-D) 2 MG tablet Take 2 mg by mouth in the morning and at bedtime.     methylPREDNISolone (MEDROL DOSEPAK) 4 MG TBPK tablet daily.     mirtazapine (REMERON) 30 MG tablet TAKE 1 TABLET BY MOUTH AT BEDTIME 30 tablet 5   Multiple Vitamin (MULTIVITAMIN WITH MINERALS) TABS tablet Take 1 tablet by mouth daily.     Multiple Vitamins-Minerals (OCUVITE EYE HEALTH FORMULA PO) Take 1 tablet by mouth  daily.     Nutritional Supplements (ENSURE PLUS HN) LIQD Take 1 Bottle daily by mouth.      pantoprazole (PROTONIX) 40 MG tablet Take 1 tablet (40 mg total) by mouth 2 (two) times daily before a meal. 180 tablet 3   PREVALITE 4 g packet Take 1 packet (4 g total) by mouth daily. DO  NOT TAKE WITHIN 2 HOURS OF OTHER MEDICATIONS 90 packet 3   traMADol (ULTRAM) 50 MG tablet Take 50 mg by mouth daily as needed for moderate pain.      zoledronic acid (RECLAST) 5 MG/100ML SOLN injection Inject 5 mg into the vein See admin instructions. Pt gets a dose yearly. Due again 2018     ondansetron (ZOFRAN-ODT) 4 MG disintegrating tablet TAKE 1 TABLET BY MOUTH EVERY 4 HOURS AS NEEDED FOR NAUSEA AND VOMITING (Patient not taking: Reported on 12/04/2020) 30 tablet 1   sucralfate (CARAFATE) 1 GM/10ML suspension TAKE 10 MLS (1 G TOTAL) BY MOUTH 4 (FOUR) TIMES DAILY AS NEEDED. FOR BREAKTHROUGH HEARTBURN (Patient not taking: Reported on 12/04/2020) 420 mL 5   No current facility-administered medications for this visit.   Facility-Administered Medications Ordered in Other Visits  Medication Dose Route Frequency Provider Last Rate Last Admin   heparin lock flush 100 unit/mL  500 Units Intravenous Once Kefalas, Thomas S, PA-C       sodium chloride flush (NS) 0.9 % injection 10 mL  10 mL Intravenous PRN Holley Bouche, NP   10 mL at 11/05/16 1136    ALLERGIES:  No Known Allergies  PHYSICAL EXAM:  Performance status (ECOG): 1 - Symptomatic but completely ambulatory  Vitals:   12/04/20 1128  BP: 103/70  Pulse: 75  Resp: 18  Temp: (!) 97 F (36.1 C)  SpO2: 100%   Wt Readings from Last 3 Encounters:  12/04/20 133 lb 3.2 oz (60.4 kg)  11/11/20 133 lb 3.2 oz (60.4 kg)  06/04/20 149 lb (67.6 kg)   Physical Exam Vitals reviewed.  Constitutional:      Appearance: Normal appearance.  Cardiovascular:     Rate and Rhythm: Normal rate and regular rhythm.     Pulses: Normal pulses.     Heart sounds: Normal heart  sounds.  Pulmonary:     Effort: Pulmonary effort is normal.     Breath sounds: Normal breath sounds.  Abdominal:     Palpations: Abdomen is soft. There is no hepatomegaly, splenomegaly or mass.     Tenderness: There is no abdominal tenderness.  Neurological:     General: No focal deficit present.     Mental Status: He is alert and oriented to person, place, and time.  Psychiatric:        Mood and Affect: Mood normal.        Behavior: Behavior normal.     LABORATORY DATA:  I have reviewed the labs as listed.  CBC Latest Ref Rng & Units 11/27/2020 05/28/2020 04/18/2020  WBC 4.0 - 10.5 K/uL 8.0 9.2 8.5  Hemoglobin 13.0 - 17.0 g/dL 12.2(L) 13.8 15.2  Hematocrit 39.0 - 52.0 % 37.3(L) 43.0 47.4  Platelets 150 - 400 K/uL 115(L) 119(L) 151   CMP Latest Ref Rng & Units 11/27/2020 05/28/2020 04/18/2020  Glucose 70 - 99 mg/dL 99 118(H) 105(H)  BUN 8 - 23 mg/dL 11 14 10   Creatinine 0.61 - 1.24 mg/dL 1.40(H) 1.53(H) 1.50(H)  Sodium 135 - 145 mmol/L 138 138 135  Potassium 3.5 - 5.1 mmol/L 4.4 4.1 5.1  Chloride 98 - 111 mmol/L 115(H) 111 108  CO2 22 - 32 mmol/L 18(L) 19(L) 22  Calcium 8.9 - 10.3 mg/dL 7.8(L) 8.3(L) 8.7(L)  Total Protein 6.5 - 8.1 g/dL 5.2(L) 6.2(L) -  Total Bilirubin 0.3 - 1.2 mg/dL 0.6 0.5 -  Alkaline Phos 38 - 126 U/L 56 65 -  AST 15 - 41 U/L 39 18 -  ALT 0 - 44 U/L 45(H) 20 -    DIAGNOSTIC IMAGING:  I have independently reviewed the scans and discussed with the patient. No results found.   ASSESSMENT:  1.  Stage III (T3N1) adenocarcinoma the left colon: - Status post surgical resection in May 2012, 1 out of 18 lymph nodes positive, could only complete 7 out of 12 FOLFOX chemotherapy secondary to neuropathy. -Last EGD/colonoscopy on 05/15/2015 was normal. - CT scan in October 2018 was negative for metastatic disease. -Bowel resection in July last year secondary to small bowel obstruction at [facility].   2.  Iron deficiency: -This is from combination of CKD and relative  iron deficiency. -Last Feraheme on 08/29/2018 and 09/05/2018.   3.  Peripheral neuropathy: - Neuropathy and the tingling and numbness in the feet. - He is taking gabapentin 100 mg at bedtime which is helping with pins-and-needles sensation.   PLAN:  1.  Stage III (T3N1) adenocarcinoma the left colon: - Denies any change in bowel habits or bleeding per rectum or melena. - CEA was 2.8.  ALT was elevated at 45 with albumin 2.7.  Rest of LFTs are within normal limits. - We will obtain PET scan results from his PMD.   2.  Iron deficiency: - Hemoglobin is 12.2 and decreased from previous value.  Ferritin is 140.  Percent saturation is 53.  Denies any bleeding per rectum or melena.  No parenteral iron therapy needed.   3.  Peripheral neuropathy: - Continue gabapentin at bedtime.   4.  Vitamin D deficiency: - Continue vitamin D 1000 Hz daily.  5.  Left lung nodule: - Reportedly found to have left lung nodule on a chest x-ray.  This was followed by PET CT scan in [place] which apparently was hypermetabolic. - We will obtain records from his primary doctor's office. - His PMD reportedly made a referral to [facility] surgery. - He has not heard back any appointment yet.  If he does not hear in the next week, he will call his PMD or [place]. - RTC 2 months for follow-up.  6.  CKD: - Creatinine is at baseline of 1.4.   Orders placed this encounter:  No orders of the defined types were placed in this encounter.    Derek Jack, MD [address] [phone]   I, Thana Ates, am acting as a scribe for Dr. Derek Jack.  I, Derek Jack MD, have reviewed the above documentation for accuracy and completeness, and I agree with the above.

## 2020-12-06 ENCOUNTER — Encounter (HOSPITAL_COMMUNITY): Payer: Self-pay | Admitting: Hematology

## 2020-12-24 ENCOUNTER — Telehealth: Payer: Self-pay | Admitting: Internal Medicine

## 2020-12-24 NOTE — Telephone Encounter (Signed)
error 

## 2021-01-07 ENCOUNTER — Other Ambulatory Visit: Payer: Self-pay | Admitting: Gastroenterology

## 2021-01-29 ENCOUNTER — Other Ambulatory Visit (HOSPITAL_COMMUNITY): Payer: Medicare Other

## 2021-02-03 ENCOUNTER — Ambulatory Visit (HOSPITAL_COMMUNITY): Payer: Medicare Other | Admitting: Hematology

## 2021-02-18 ENCOUNTER — Ambulatory Visit: Payer: Medicare Other | Admitting: Gastroenterology

## 2021-03-02 ENCOUNTER — Encounter (HOSPITAL_COMMUNITY): Payer: Self-pay | Admitting: *Deleted

## 2021-03-02 ENCOUNTER — Other Ambulatory Visit: Payer: Self-pay | Admitting: Gastroenterology

## 2021-03-02 NOTE — Progress Notes (Signed)
Followed up with patient's wife following a very long hospitalization at [facility] followed up by a stay in rehabilitation.  Patient is now doing well and has an appointment with his surgeon tomorrow and will call us back to schedule a f/u appointment with Dr Delton Coombes once he has spoken with surgeon.  Needs addressed and denies needing assistance with anything at this time.

## 2021-03-10 ENCOUNTER — Other Ambulatory Visit: Payer: Self-pay

## 2021-03-10 ENCOUNTER — Inpatient Hospital Stay (HOSPITAL_COMMUNITY): Payer: Medicare Other | Attending: Hematology

## 2021-03-10 DIAGNOSIS — N189 Chronic kidney disease, unspecified: Secondary | ICD-10-CM | POA: Insufficient documentation

## 2021-03-10 DIAGNOSIS — E559 Vitamin D deficiency, unspecified: Secondary | ICD-10-CM | POA: Diagnosis not present

## 2021-03-10 DIAGNOSIS — Z9049 Acquired absence of other specified parts of digestive tract: Secondary | ICD-10-CM | POA: Diagnosis not present

## 2021-03-10 DIAGNOSIS — Z85118 Personal history of other malignant neoplasm of bronchus and lung: Secondary | ICD-10-CM | POA: Insufficient documentation

## 2021-03-10 DIAGNOSIS — E611 Iron deficiency: Secondary | ICD-10-CM | POA: Insufficient documentation

## 2021-03-10 DIAGNOSIS — G629 Polyneuropathy, unspecified: Secondary | ICD-10-CM | POA: Insufficient documentation

## 2021-03-10 DIAGNOSIS — C186 Malignant neoplasm of descending colon: Secondary | ICD-10-CM | POA: Diagnosis present

## 2021-03-10 DIAGNOSIS — C189 Malignant neoplasm of colon, unspecified: Secondary | ICD-10-CM

## 2021-03-10 LAB — CBC WITH DIFFERENTIAL/PLATELET
Abs Immature Granulocytes: 0.09 10*3/uL — ABNORMAL HIGH (ref 0.00–0.07)
Basophils Absolute: 0.1 10*3/uL (ref 0.0–0.1)
Basophils Relative: 0 %
Eosinophils Absolute: 0.2 10*3/uL (ref 0.0–0.5)
Eosinophils Relative: 1 %
HCT: 41.4 % (ref 39.0–52.0)
Hemoglobin: 13.1 g/dL (ref 13.0–17.0)
Immature Granulocytes: 1 %
Lymphocytes Relative: 14 %
Lymphs Abs: 2.1 10*3/uL (ref 0.7–4.0)
MCH: 29.2 pg (ref 26.0–34.0)
MCHC: 31.6 g/dL (ref 30.0–36.0)
MCV: 92.4 fL (ref 80.0–100.0)
Monocytes Absolute: 1.1 10*3/uL — ABNORMAL HIGH (ref 0.1–1.0)
Monocytes Relative: 8 %
Neutro Abs: 11.2 10*3/uL — ABNORMAL HIGH (ref 1.7–7.7)
Neutrophils Relative %: 76 %
Platelets: 188 10*3/uL (ref 150–400)
RBC: 4.48 MIL/uL (ref 4.22–5.81)
RDW: 15.2 % (ref 11.5–15.5)
WBC: 14.6 10*3/uL — ABNORMAL HIGH (ref 4.0–10.5)
nRBC: 0 % (ref 0.0–0.2)

## 2021-03-10 LAB — COMPREHENSIVE METABOLIC PANEL
ALT: 31 U/L (ref 0–44)
AST: 26 U/L (ref 15–41)
Albumin: 2.9 g/dL — ABNORMAL LOW (ref 3.5–5.0)
Alkaline Phosphatase: 104 U/L (ref 38–126)
Anion gap: 7 (ref 5–15)
BUN: 11 mg/dL (ref 8–23)
CO2: 19 mmol/L — ABNORMAL LOW (ref 22–32)
Calcium: 8.2 mg/dL — ABNORMAL LOW (ref 8.9–10.3)
Chloride: 112 mmol/L — ABNORMAL HIGH (ref 98–111)
Creatinine, Ser: 1.44 mg/dL — ABNORMAL HIGH (ref 0.61–1.24)
GFR, Estimated: 52 mL/min — ABNORMAL LOW (ref >60)
Glucose, Bld: 103 mg/dL — ABNORMAL HIGH (ref 70–99)
Potassium: 4.1 mmol/L (ref 3.5–5.1)
Sodium: 138 mmol/L (ref 135–145)
Total Bilirubin: 0.2 mg/dL — ABNORMAL LOW (ref 0.3–1.2)
Total Protein: 6.4 g/dL — ABNORMAL LOW (ref 6.5–8.1)

## 2021-03-10 MED ORDER — SODIUM CHLORIDE 0.9% FLUSH
10.0000 mL | Freq: Once | INTRAVENOUS | Status: AC
  Administered 2021-03-10: 11:00:00 10 mL via INTRAVENOUS

## 2021-03-10 MED ORDER — HEPARIN SOD (PORK) LOCK FLUSH 100 UNIT/ML IV SOLN
500.0000 [IU] | Freq: Once | INTRAVENOUS | Status: AC
  Administered 2021-03-10: 11:00:00 500 [IU] via INTRAVENOUS

## 2021-03-10 NOTE — Progress Notes (Signed)
Patients port flushed without difficulty.  Good blood return noted with no bruising or swelling noted at site.  Stable during access and blood draw.  Band aid applied.  VSS with discharge and left in satisfactory condition with no s/s of distress noted.   ?

## 2021-03-10 NOTE — Patient Instructions (Signed)
[facility]  Discharge Instructions: Thank you for choosing [facility] to provide your oncology and hematology care.  If you have a lab appointment with the Cancer Center, please come in thru the Main Entrance and check in at the main information desk.  Wear comfortable clothing and clothing appropriate for easy access to any Portacath or PICC line.   We strive to give you quality time with your provider. You may need to reschedule your appointment if you arrive late (15 or more minutes).  Arriving late affects you and other patients whose appointments are after yours.  Also, if you miss three or more appointments without notifying the office, you may be dismissed from the clinic at the provider's discretion.      For prescription refill requests, have your pharmacy contact our office and allow 72 hours for refills to be completed.    Today you received the following chemotherapy and/or immunotherapy agents PORT flush labs      To help prevent nausea and vomiting after your treatment, we encourage you to take your nausea medication as directed.  BELOW ARE SYMPTOMS THAT SHOULD BE REPORTED IMMEDIATELY: *FEVER GREATER THAN 100.4 F (38 C) OR HIGHER *CHILLS OR SWEATING *NAUSEA AND VOMITING THAT IS NOT CONTROLLED WITH YOUR NAUSEA MEDICATION *UNUSUAL SHORTNESS OF BREATH *UNUSUAL BRUISING OR BLEEDING *URINARY PROBLEMS (pain or burning when urinating, or frequent urination) *BOWEL PROBLEMS (unusual diarrhea, constipation, pain near the anus) TENDERNESS IN MOUTH AND THROAT WITH OR WITHOUT PRESENCE OF ULCERS (sore throat, sores in mouth, or a toothache) UNUSUAL RASH, SWELLING OR PAIN  UNUSUAL VAGINAL DISCHARGE OR ITCHING   Items with * indicate a potential emergency and should be followed up as soon as possible or go to the Emergency Department if any problems should occur.  Please show the CHEMOTHERAPY ALERT CARD or IMMUNOTHERAPY ALERT CARD at check-in to the Emergency  Department and triage nurse.  Should you have questions after your visit or need to cancel or reschedule your appointment, please contact [facility] [phone]  and follow the prompts.  Office hours are 8:00 a.m. to 4:30 p.m. Monday - Friday. Please note that voicemails left after 4:00 p.m. may not be returned until the following business day.  We are closed weekends and major holidays. You have access to a nurse at all times for urgent questions. Please call the main number to the clinic [phone] and follow the prompts.  For any non-urgent questions, you may also contact your provider using MyChart. We now offer e-Visits for anyone 18 and older to request care online for non-urgent symptoms. For details visit [email or web address].   Also download the MyChart app! Go to the app store, search "MyChart", open the app, select [facility], and log in with your MyChart username and password.  Due to Covid, a mask is required upon entering the hospital/clinic. If you do not have a mask, one will be given to you upon arrival. For doctor visits, patients may have 1 support person aged 18 or older with them. For treatment visits, patients cannot have anyone with them due to current Covid guidelines and our immunocompromised population.  

## 2021-03-11 LAB — CEA: CEA: 3.3 ng/mL (ref 0.0–4.7)

## 2021-03-12 ENCOUNTER — Other Ambulatory Visit (HOSPITAL_COMMUNITY): Payer: Self-pay | Admitting: Hematology

## 2021-03-12 DIAGNOSIS — C189 Malignant neoplasm of colon, unspecified: Secondary | ICD-10-CM

## 2021-03-16 NOTE — Progress Notes (Signed)
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 Left hemicolectomy on 07/2010. 2. FOLFOX x 7 cycles from 09/28/2010 to 01/04/2011 stopped due to neuropathy.  NGS Results: not done  CURRENT THERAPY: Surveillance; intermittent Feraheme last on 09/05/2018  BRIEF ONCOLOGIC HISTORY:  Oncology History   No history exists.    CANCER STAGING:  Cancer Staging  Adenocarcinoma of colon with mucinous features Staging form: Colon and Rectum, AJCC 7th Edition - Clinical: Stage IIIB (T3, N1, M0) - Signed by Baird Cancer, PA on 09/22/2010   INTERVAL HISTORY:  [name], a 70 y.o. 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 Pride was last seen on 12/04/2020.   Today he reports feeling well. He will have his PEG tube removed 12/30. He was hospitalized for pneumonia for 28 days. He reports stable diarrhea.   REVIEW OF SYSTEMS:  Review of Systems  Constitutional:  Negative for appetite change (80%) and fatigue (50%).  Respiratory:  Positive for shortness of breath.   Gastrointestinal:  Positive for diarrhea (stable).  All other systems reviewed and are negative.  PAST MEDICAL/SURGICAL HISTORY:  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   Stage 3   Anemia    Anxiety    Arthritis    Barrett's esophagus    Blood transfusion    Bowel obstruction ([place]) 05/13/2012   Recurrent   Bronchitis    Chest pain at rest    Chronic abdominal pain    Erosive esophagitis    ETOH abuse    quit 03/2010   GERD (gastroesophageal reflux disease)    Hx of Clostridium difficile infection 01/2012   Hypertension    Ileus ([place])    Iron deficiency anemia  03/23/2016   Obstruction of bowel ([place]) 03/03/14   Osteoporosis    Personal history of PE (pulmonary embolism) 10/01/2010   Pneumonia    Pulmonary embolism ([place]) 02/2010   Recurrent upper respiratory infection (URI)    Renal disorder    S/P endoscopy September 28, 2010   erosive reflux esophagitis, Billroth I anatomy   S/P partial gastrectomy 1980s   Seizures ([place])    was on meds for 6 months, Unknown etiology, last seizure was 2017.   Shortness of breath    TIA (transient ischemic attack) 10/11   Vitamin B12 deficiency    Past Surgical History:  Procedure Laterality Date   ABDOMINAL ADHESION SURGERY  03/04/15   @ [facility]   ABDOMINAL EXPLORATION SURGERY     abdominal sugery     for bowel obstruction x 8, all in 1980s, except for one in 07/2010   APPENDECTOMY  1980s   Billroth 1 hemigastrectomy  1980s   per patient for benign duodenal tumor   CARDIAC CATHETERIZATION  07/17/2012   CHOLECYSTECTOMY  1980s   COLON SURGERY  May 2012   left hemicolectomy, colon cancer found at time of surgery for bowel obstruction   COLON SURGERY  10/12/2018   At [facility]: Resection of right colon and terminal ileum with creation of ileorectal anastomosis for large bowel obstruction.  Cecum and ascending colon noted to be attached to the rectum.   COLONOSCOPY  03/18/2011   anastomosis at 35cm. Several adenomatous polyps removed. Sigmoid diverticulosis. Next TCS 02/2013   COLONOSCOPY N/A 07/24/2012   [id number] post segmental resection with normal-appearing colonic anastomosis aside from an adjacent polyp-removed as described above. Rectal polyp-removed as described above. CT findings appear to have been artifactual. tubular adenomas/prolapsed type polyp.   COLONOSCOPY N/A 05/15/2015   Procedure: COLONOSCOPY;  Surgeon: [name], MD;  [facility];  Service: Endoscopy;  Laterality: N/A;   COLONOSCOPY  09/26/2018   at [facility]: prep not adequate for colon cancer surveillance.  Prior end-to-end colonic  anastomosis in the rectosigmoid region.  This was patent and characterized by healthy-appearing mucosa.  Anastomosis was traversed.  Normal terminal ileum.   COLONOSCOPY WITH PROPOFOL N/A 11/04/2016   Dr. [name], status post subtotal colectomy with normal-appearing residual lower GI tract.  Next colonoscopy in 5 years.   ESOPHAGOGASTRODUODENOSCOPY  09/28/2010   ESOPHAGOGASTRODUODENOSCOPY  12/01/2010   Cervical web status post dilation, erosive esophagitis, B1 hemigastrectomy, inflamed anastomosis   ESOPHAGOGASTRODUODENOSCOPY  04/16/2011   excoriation at [facility] c/w trauma/M-W tear, friable gastric anastomosis, dilation efferent limb   ESOPHAGOGASTRODUODENOSCOPY N/A 06/03/2014   Dr.[name]- cervcal esopphageal web s/p dilation. abnormal distal esophagus bx= barretts esophagus   ESOPHAGOGASTRODUODENOSCOPY N/A 05/15/2015   Procedure: ESOPHAGOGASTRODUODENOSCOPY (EGD);  Surgeon: [name], MD;  [facility];  Service: Endoscopy;  Laterality: N/A;  230   ESOPHAGOGASTRODUODENOSCOPY (EGD) WITH ESOPHAGEAL DILATION  02/25/2012   ONG:[id number] esophageal web-s/p dilation anddisruption as described above. Status post prior gastric with Billroth I configuration. Abnormal gastric mucosa at the anastomosis. Gastric biopsy showed mild chronic inflammation but no H. pylori    ESOPHAGOGASTRODUODENOSCOPY (EGD) WITH PROPOFOL N/A 11/04/2016   Dr. [name]: Reflux esophagitis, small hiatal hernia status post hemigastrectomy.  Single patent efferent small bowel limb appeared normal, 2 x 2 centimeter tongue of salmon epithelium again seen, esophageal dilation.  Biopsies consistent with reflux changes, not Barrett's however this was confirmed on prior EGDs.  Offer 3-year follow-up EGD August 2021.   ESOPHAGOGASTRODUODENOSCOPY (EGD) WITH PROPOFOL N/A 03/03/2020   Procedure: ESOPHAGOGASTRODUODENOSCOPY (EGD) WITH PROPOFOL;  Surgeon: [name], MD;  [facility];  Service: Endoscopy;  Laterality: N/A;  3:00pm    HERNIA REPAIR     right inguinal   MALONEY DILATION N/A 06/03/2014   Procedure: [name] DILATION;  Surgeon: [name], MD;  [facility];  Service: Endoscopy;  Laterality: N/A;   MALONEY DILATION N/A 05/15/2015   Procedure: [name] DILATION;  Surgeon: [name], MD;  [facility];  Service: Endoscopy;  Laterality: N/A;   MALONEY DILATION N/A 11/04/2016   Procedure: [name] DILATION;  Surgeon: [name], MD;  [facility];  Service: Endoscopy;  Laterality: N/A;   MALONEY DILATION N/A 03/03/2020   Procedure: [name] DILATION;  Surgeon: [name], MD;  [facility];  Service: Endoscopy;  Laterality: N/A;   PORTACATH PLACEMENT     SAVORY DILATION N/A 06/03/2014   Procedure: SAVORY DILATION;  Surgeon: [name], MD;  [facility];  Service: Endoscopy;  Laterality: N/A;    SOCIAL HISTORY:  Social History   Socioeconomic History   Marital status: Married    Spouse name: Not on file   Number of children: 3   Years of education: Not on file   Highest education level: Not on file  Occupational History    Employer: [employer]  Tobacco Use  Smoking status: Former    Packs/day: 0.50    Years: 40.00    Pack years: 20.00    Types: Cigarettes    Quit date: 12/20/2012    Years since quitting: 8.2   Smokeless tobacco: Never  Vaping Use   Vaping Use: Never used  Substance and Sexual Activity   Alcohol use: No   Drug use: No   Sexual activity: Never  Other Topics Concern   Not on file  Social History Narrative   Not on file   Social Determinants of Health   Financial Resource Strain: Not on file  Food Insecurity: Not on file  Transportation Needs: Not on file  Physical Activity: Not on file  Stress: Not on file  Social Connections: Not on file  Intimate Partner Violence: Not on file    FAMILY HISTORY:  Family History  Problem Relation Age of Onset   Hypertension Mother    Arthritis Mother     Pneumonia Mother    Hypertension Father    Heart attack Father    Colon cancer Neg Hx     CURRENT MEDICATIONS:  Current Outpatient Medications  Medication Sig Dispense Refill   ammonium lactate (AMLACTIN) 12 % cream Apply 1 g topically See admin instructions. APPLY CREAM TO DRY SKIN ON LEGS ARMS HANDS AND BACK AREA ONCE TO TWICE DAILY (AVOID FACE GROIN UNDERARMS)  3   atorvastatin (LIPITOR) 20 MG tablet Take 20 mg by mouth at bedtime.      baclofen (LIORESAL) 10 MG tablet as needed.     carvedilol (COREG) 25 MG tablet Take 0.5 tablets (12.5 mg total) by mouth 2 (two) times daily with a meal. (Patient taking differently: Take 25 mg by mouth 2 (two) times daily with a meal. Takes 25 mg 2 times daily.) 15 tablet 2   Cholecalciferol (VITAMIN D) 2000 units CAPS Take 2,000 Units by mouth daily.     CREON 36000-114000 units CPEP capsule TAKE 3 CAPSULES WITH MEALS AND 2 CAPSULE WITH SNACKS. 900 capsule 3   diphenoxylate-atropine (LOMOTIL) 2.5-0.025 MG tablet Take 1 tablet by mouth 4 times daily 120 tablet 5   enoxaparin (LOVENOX) 60 MG/0.6ML injection Inject 60 mg into the skin daily.     Ferrous Sulfate (IRON) 325 (65 Fe) MG TABS Take by mouth every other day.     gabapentin (NEURONTIN) 100 MG capsule Take 1 capsule by mouth at bedtime 30 capsule 6   loperamide (IMODIUM A-D) 2 MG tablet Take 2 mg by mouth in the morning and at bedtime.     methylPREDNISolone (MEDROL DOSEPAK) 4 MG TBPK tablet daily.     mirtazapine (REMERON) 30 MG tablet TAKE 1 TABLET BY MOUTH AT BEDTIME 30 tablet 5   Multiple Vitamin (MULTIVITAMIN WITH MINERALS) TABS tablet Take 1 tablet by mouth daily.     Multiple Vitamins-Minerals (OCUVITE EYE HEALTH FORMULA PO) Take 1 tablet by mouth daily.     Nutritional Supplements (ENSURE PLUS HN) LIQD Take 1 Bottle daily by mouth.      pantoprazole (PROTONIX) 40 MG tablet TAKE 1 TABLET BY MOUTH 2 TIMES DAILY BEFORE A MEAL. 180 tablet 3   PREVALITE 4 g packet TAKE 1 PACKET (4 G TOTAL) BY  MOUTH DAILY. DO NOT TAKE WITHIN 2 HOURS OF OTHER MEDICATIONS 90 packet 3   zoledronic acid (RECLAST) 5 MG/100ML SOLN injection Inject 5 mg into the vein See admin instructions. Pt gets a dose yearly. Due again 2018     ondansetron (ZOFRAN-ODT) 4  MG disintegrating tablet TAKE 1 TABLET BY MOUTH EVERY 4 HOURS AS NEEDED FOR NAUSEA AND VOMITING (Patient not taking: Reported on 03/17/2021) 30 tablet 1   sucralfate (CARAFATE) 1 GM/10ML suspension TAKE 10 MLS (1 G TOTAL) BY MOUTH 4 (FOUR) TIMES DAILY AS NEEDED. FOR BREAKTHROUGH HEARTBURN (Patient not taking: Reported on 03/17/2021) 420 mL 5   traMADol (ULTRAM) 50 MG tablet Take 50 mg by mouth daily as needed for moderate pain.  (Patient not taking: Reported on 03/17/2021)     No current facility-administered medications for this visit.   Facility-Administered Medications Ordered in Other Visits  Medication Dose Route Frequency Provider Last Rate Last Admin   heparin lock flush 100 unit/mL  500 Units Intravenous Once Kefalas, Thomas S, PA-C       sodium chloride flush (NS) 0.9 % injection 10 mL  10 mL Intravenous PRN Holley Bouche, NP   10 mL at 11/05/16 1136    ALLERGIES:  No Known Allergies  PHYSICAL EXAM:  Performance status (ECOG): 1 - Symptomatic but completely ambulatory  Vitals:   03/17/21 1152  BP: 117/77  Pulse: 84  Resp: 18  Temp: 97.6 F (36.4 C)  SpO2: 100%   Wt Readings from Last 3 Encounters:  03/17/21 135 lb 12.8 oz (61.6 kg)  12/04/20 133 lb 3.2 oz (60.4 kg)  11/11/20 133 lb 3.2 oz (60.4 kg)   Physical Exam Vitals reviewed.  Constitutional:      Appearance: Normal appearance.  Cardiovascular:     Rate and Rhythm: Normal rate and regular rhythm.     Pulses: Normal pulses.     Heart sounds: Normal heart sounds.  Pulmonary:     Effort: Pulmonary effort is normal.     Breath sounds: Normal breath sounds.  Neurological:     General: No focal deficit present.     Mental Status: He is alert and oriented to person,  place, and time.  Psychiatric:        Mood and Affect: Mood normal.        Behavior: Behavior normal.     LABORATORY DATA:  I have reviewed the labs as listed.  CBC Latest Ref Rng & Units 03/10/2021 11/27/2020 05/28/2020  WBC 4.0 - 10.5 K/uL 14.6(H) 8.0 9.2  Hemoglobin 13.0 - 17.0 g/dL 13.1 12.2(L) 13.8  Hematocrit 39.0 - 52.0 % 41.4 37.3(L) 43.0  Platelets 150 - 400 K/uL 188 115(L) 119(L)   CMP Latest Ref Rng & Units 03/10/2021 11/27/2020 05/28/2020  Glucose 70 - 99 mg/dL 103(H) 99 118(H)  BUN 8 - 23 mg/dL 11 11 14   Creatinine 0.61 - 1.24 mg/dL 1.44(H) 1.40(H) 1.53(H)  Sodium 135 - 145 mmol/L 138 138 138  Potassium 3.5 - 5.1 mmol/L 4.1 4.4 4.1  Chloride 98 - 111 mmol/L 112(H) 115(H) 111  CO2 22 - 32 mmol/L 19(L) 18(L) 19(L)  Calcium 8.9 - 10.3 mg/dL 8.2(L) 7.8(L) 8.3(L)  Total Protein 6.5 - 8.1 g/dL 6.4(L) 5.2(L) 6.2(L)  Total Bilirubin 0.3 - 1.2 mg/dL 0.2(L) 0.6 0.5  Alkaline Phos 38 - 126 U/L 104 56 65  AST 15 - 41 U/L 26 39 18  ALT 0 - 44 U/L 31 45(H) 20    DIAGNOSTIC IMAGING:  I have independently reviewed the scans and discussed with the patient. No results found.   ASSESSMENT:  1.  Stage III (T3N1) adenocarcinoma the left colon: - Status post surgical resection in May 2012, 1 out of 18 lymph nodes positive, could only complete 7 out of  12 FOLFOX chemotherapy secondary to neuropathy. -Last EGD/colonoscopy on 05/15/2015 was normal. - CT scan in October 2018 was negative for metastatic disease. -Bowel resection in July last year secondary to small bowel obstruction at [facility].   2.  Iron deficiency: -This is from combination of CKD and relative iron deficiency. -Last Feraheme on 08/29/2018 and 09/05/2018.   3.  Peripheral neuropathy: - Neuropathy and the tingling and numbness in the feet. - He is taking gabapentin 100 mg at bedtime which is helping with pins-and-needles sensation.   PLAN:  1.  Stage III (T3N1) adenocarcinoma the left colon: -Denies any change in  bowel habits or bleeding per rectum or melena. - Reviewed labs from 03/10/2021 which showed normal LFTs.  CEA was normal at 3.3. - No indication for imaging at this time.   2.  Iron deficiency: - He is taking iron tablet every other day. - Hemoglobin is 13.1.  Last ferritin was 140 on 11/27/2020. - Continue iron tablet every other day.  No indication for parenteral iron therapy at this time. - RTC 6 months with repeat CBC, ferritin and iron panel.   3.  Peripheral neuropathy: - Continue gabapentin at bedtime.   4.  Vitamin D deficiency: - Continue vitamin D 1000 units daily.  5.  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  I discussed the surveillance plan with him. - 1.6 cm moderately differentiated adenocarcinoma, TTF-1 and Napsin positive and negative for CK20.  Margins were negative.  2 hilar lymph nodes were negative. - No adjuvant therapy needed.  Continue surveillance with repeat CT scan which is scheduled at [facility].  6.  CKD: - Creatinine is stable at 1.44.   Orders placed this encounter:  No orders of the defined types were placed in this encounter.    Derek Jack, MD [place] [phone]   I, Thana Ates, am acting as a scribe for Dr. Derek Jack.  I, Derek Jack MD, have reviewed the above documentation for accuracy and completeness, and I agree with the above.

## 2021-03-17 ENCOUNTER — Inpatient Hospital Stay (HOSPITAL_BASED_OUTPATIENT_CLINIC_OR_DEPARTMENT_OTHER): Payer: Medicare Other | Admitting: Hematology

## 2021-03-17 ENCOUNTER — Other Ambulatory Visit: Payer: Self-pay

## 2021-03-17 VITALS — BP 117/77 | HR 84 | Temp 97.6°F | Resp 18 | Ht 67.5 in | Wt 135.8 lb

## 2021-03-17 DIAGNOSIS — D508 Other iron deficiency anemias: Secondary | ICD-10-CM

## 2021-03-17 DIAGNOSIS — C189 Malignant neoplasm of colon, unspecified: Secondary | ICD-10-CM | POA: Diagnosis not present

## 2021-03-17 DIAGNOSIS — C186 Malignant neoplasm of descending colon: Secondary | ICD-10-CM | POA: Diagnosis not present

## 2021-03-17 NOTE — Patient Instructions (Signed)
[place] at [facility] Discharge Instructions   You were seen and examined today by Dr. Delton Coombes. He reviewed your lab work, which was normal/stable. Continue iron tablets every other day. Return as scheduled in 6 months for lab work and office visit.    Thank you for choosing [place] at [facility] to provide your oncology and hematology care.  To afford each patient quality time with our provider, please arrive at least 15 minutes before your scheduled appointment time.   If you have a lab appointment with the [facility] please come in thru the Main Entrance and check in at the main information desk.  You need to re-schedule your appointment should you arrive 10 or more minutes late.  We strive to give you quality time with our providers, and arriving late affects you and other patients whose appointments are after yours.  Also, if you no show three or more times for appointments you may be dismissed from the clinic at the providers discretion.     Again, thank you for choosing [facility].  Our hope is that these requests will decrease the amount of time that you wait before being seen by our physicians.       _____________________________________________________________  Should you have questions after your visit to [facility], please contact our office at [phone] and follow the prompts.  Our office hours are 8:00 a.m. and 4:30 p.m. Monday - Friday.  Please note that voicemails left after 4:00 p.m. may not be returned until the following business day.  We are closed weekends and major holidays.  You do have access to a nurse 24-7, just call the main number to the clinic [phone] and do not press any options, hold on the line and a nurse will answer the phone.    For prescription refill requests, have your pharmacy contact our office and allow 72 hours.    Due to Covid, you will need to wear a mask  upon entering the hospital. If you do not have a mask, a mask will be given to you at the Main Entrance upon arrival. For doctor visits, patients may have 1 support person age 75 or older with them. For treatment visits, patients can not have anyone with them due to social distancing guidelines and our immunocompromised population.

## 2021-04-01 ENCOUNTER — Other Ambulatory Visit: Payer: Self-pay | Admitting: Gastroenterology

## 2021-04-13 ENCOUNTER — Telehealth: Payer: Self-pay | Admitting: Gastroenterology

## 2021-04-13 ENCOUNTER — Ambulatory Visit (INDEPENDENT_AMBULATORY_CARE_PROVIDER_SITE_OTHER): Payer: Medicare Other | Admitting: Gastroenterology

## 2021-04-13 ENCOUNTER — Other Ambulatory Visit: Payer: Self-pay

## 2021-04-13 ENCOUNTER — Encounter: Payer: Self-pay | Admitting: Gastroenterology

## 2021-04-13 VITALS — BP 114/68 | HR 75 | Temp 97.3°F | Ht 68.0 in | Wt 140.6 lb

## 2021-04-13 DIAGNOSIS — R197 Diarrhea, unspecified: Secondary | ICD-10-CM | POA: Diagnosis not present

## 2021-04-13 DIAGNOSIS — K8689 Other specified diseases of pancreas: Secondary | ICD-10-CM

## 2021-04-13 DIAGNOSIS — Z85038 Personal history of other malignant neoplasm of large intestine: Secondary | ICD-10-CM | POA: Diagnosis not present

## 2021-04-13 DIAGNOSIS — K2270 Barrett's esophagus without dysplasia: Secondary | ICD-10-CM | POA: Diagnosis not present

## 2021-04-13 NOTE — Telephone Encounter (Signed)
Please let pt know I reviewed his [facility] records including the barium test he recently completed. Nothing extra needed at this time. I plan to wait until he is seen at [facility] in 07/2021 before we consider flex sig here. I will follow up records when available.  He should let us know of any status changes, new symptoms, or worsening symptoms.

## 2021-04-13 NOTE — Progress Notes (Signed)
Primary Care Physician: [name], MD  Primary Gastroenterologist:  Garfield Cornea, MD   Chief Complaint  Patient presents with   Diarrhea    3x/day    HPI: [name] is a 71 y.o. male here for follow-up.  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  Most recent surgery left him with ileorectal anastomosis. Colon cancer felt to be in remission, followed by oncology.  He has a history of prior alcohol abuse with pancreatic exocrine insufficiency.  History of multiple small bowel obstructions, chronic GERD, short segment Barrett's esophagus.  Patient with history of iron deficiency anemia followed by hematology, felt to be due to combination of chronic kidney disease and relative iron deficiency.  He has been receiving intermittent iron infusions as needed, last time in June 2020.  Since his last office visit he was diagnosed with stage I adenocarcinoma of the lung.  From a GI standpoint, he has gained 7 pounds since his last office visit here.  Patient states he actually lost down to 129 pounds when he was in the hospital for 1 month after his lung surgery.  He wants to get back to 155 pounds.  Continues to take Lomotil 4 times daily, Creon 3 with meals and 2 with snacks, Imodium twice daily as needed, Prevalite 4 g daily.  Stools remain watery, typically about 3/day.  Denies any nocturnal diarrhea.  No significant abdominal pain.  No nausea or vomiting.  Appetite okay.  No heartburn.  No melena or rectal bleeding.   Patient was hospitalized from November 13 - February 11, 2021. Robotic assisted left upper lobectomy for left upper lobe adenocarcinoma.  Initially did well postoperatively but on postop day 2 required reintubation for hypoxic respiratory failure and underwent bronchoscopy which showed  significant mucus plugging of the left lower lobe.  He was extubated on postop day 4.  On postop day 6 he was found to have reaccumulated mucous plug with collapsed left lung, required tracheostomy.  PEJ was placed a few days later.  On postop day 9 chest x-ray showed left-sided pleural effusion, leading to placement of pigtail drain.  On postop day 11 another bronchoscopy performed revealing thick secretions on the left.  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   Modified barium swallow study completed November 15, laryngeal penetration occurred with pure and solids.  Aspiration did not occur with tested consistencies.  Mild to moderate residue following swallows noted.  Repeat modified barium swallow study April 08, 2021 with minimal residue following hard solids otherwise normal study.  Upper GI series with small bowel follow-through January 19, follow-up examination post removal of J-tube.  Interval removal of J-tube without findings for leak or complications.  Operative findings of partial gastrectomy and jejunostomy.  Delayed transition of barium which remained to the small bowel at 4 hours.  Patient reported passing of barium per rectum after reaching his home encouraging excluding obstruction.  Patient reports that he has appt with [facility], Dr. Rory Percy, 07/20/21.   Pertinent GI history: Previous work-up included negative celiac screen, negative stool studies.  He has a history of osteoporosis and B12 deficiency.  Status post cholecystectomy, hemigastrectomy in the [employer] for benign tumor, numerous small bowel resections for abdominal adhesions and bowel obstruction, and subtotal colectomy for colon cancer. Total of 16 abdominal  surgeries per patient. EGD and colonoscopy in 2018 as outlined below.  Stool pancreatic elastase came back low at 28. He was started on Creon.   EGD December 2021: Esophagus was normal except for two 1 cm tongues of salmon-colored epithelium coming up above  the GE junction.  Tubular esophagus patent.  Esophagus dilated for history of dysphagia.  Stomach surgically altered with single efferent small bowel limb. Residual stomach appeared normal. Finally, biopsies of the abnormal distal esophagus were taken for histologic study. Bx consistent with reflux changes, given prior biopsy confirmed Barrett's esophagus, Dr. [name] recommended 3 year follow up EGD.   EGD in August 2018 with reflux esophagitis, small hiatal hernia status post hemigastrectomy. Single patent efferent small bowel ileum appeared normal, 2 x 2 centimeter tongue of salmon epithelium again seen, esophageal dilation.  Biopsies consistent with reflux changes, not Barrett's however this was confirmed on prior EGDs, patient will be offered EGD in August 2021.   Colonoscopy August 2018 status post subtotal colectomy with normal-appearing residual lower GI tract.  Next colonoscopy in 5 years.   Two recent admissions and [facility] back in July 2020.  First 1 for suspected small bowel obstruction. He had a colonoscopy July 7 showing prior end-to-end colocolonic anastomosis in the rectosigmoid colon.  This was patent and was characterized by healthy-appearing mucosa.  The anastomosis was traversed.  The terminal ileum appeared normal.  Prep was inadequate to qualify for colorectal cancer surveillance.  Small bowel obstruction treated conservatively and patient was discharged.     Patient underwent resection of the right colon and terminal ileum with creation of end to end ileal rectal anastomosis on October 12, 2018 for large bowel obstruction.  Work-up at admission revealed patent colorectal anastomosis with short segment of cecum and ascending colon attached to the rectum. It was felt that he had a dysfunctional cecum causing incomplete emptying and was massively distended on CT.      Current Outpatient Medications  Medication Sig Dispense Refill   ammonium lactate (AMLACTIN) 12 % cream Apply 1 g topically  See admin instructions. APPLY CREAM TO DRY SKIN ON LEGS ARMS HANDS AND BACK AREA ONCE TO TWICE DAILY (AVOID FACE GROIN UNDERARMS)  3   atorvastatin (LIPITOR) 20 MG tablet Take 20 mg by mouth at bedtime.      baclofen (LIORESAL) 10 MG tablet as needed.     carvedilol (COREG) 25 MG tablet Take 0.5 tablets (12.5 mg total) by mouth 2 (two) times daily with a meal. (Patient taking differently: Take 25 mg by mouth 2 (two) times daily with a meal. Takes 25 mg 2 times daily.) 15 tablet 2   Cholecalciferol (VITAMIN D) 2000 units CAPS Take 2,000 Units by mouth daily.     CREON 36000-114000 units CPEP capsule TAKE 3 CAPSULES WITH MEALS AND 2 CAPSULE WITH SNACKS. 900 capsule 3   diphenoxylate-atropine (LOMOTIL) 2.5-0.025 MG tablet Take 1 tablet by mouth 4 times daily 120 tablet 0   enoxaparin (LOVENOX) 60 MG/0.6ML injection Inject 60 mg into the skin daily.     Ferrous Sulfate (IRON) 325 (65 Fe) MG TABS Take by mouth every other day.     gabapentin (NEURONTIN) 100 MG capsule Take 1 capsule by mouth at bedtime 30 capsule 6   loperamide (IMODIUM A-D) 2 MG tablet Take 2 mg by mouth in the morning and at bedtime.     mirtazapine (REMERON) 30 MG tablet TAKE 1 TABLET BY MOUTH AT BEDTIME 30 tablet 5   Multiple Vitamin (  MULTIVITAMIN WITH MINERALS) TABS tablet Take 1 tablet by mouth daily.     Multiple Vitamins-Minerals (OCUVITE EYE HEALTH FORMULA PO) Take 1 tablet by mouth daily.     ondansetron (ZOFRAN-ODT) 4 MG disintegrating tablet TAKE 1 TABLET BY MOUTH EVERY 4 HOURS AS NEEDED FOR NAUSEA AND VOMITING 30 tablet 1   pantoprazole (PROTONIX) 40 MG tablet TAKE 1 TABLET BY MOUTH 2 TIMES DAILY BEFORE A MEAL. 180 tablet 3   PREVALITE 4 g packet TAKE 1 PACKET (4 G TOTAL) BY MOUTH DAILY. DO NOT TAKE WITHIN 2 HOURS OF OTHER MEDICATIONS 90 packet 3   sucralfate (CARAFATE) 1 GM/10ML suspension TAKE 10 MLS (1 G TOTAL) BY MOUTH 4 (FOUR) TIMES DAILY AS NEEDED. FOR BREAKTHROUGH HEARTBURN 420 mL 5   traMADol (ULTRAM) 50 MG tablet  Take 50 mg by mouth daily as needed for moderate pain.     zoledronic acid (RECLAST) 5 MG/100ML SOLN injection Inject 5 mg into the vein See admin instructions. Pt gets a dose yearly. Due again 2018     No current facility-administered medications for this visit.   Facility-Administered Medications Ordered in Other Visits  Medication Dose Route Frequency Provider Last Rate Last Admin   heparin lock flush 100 unit/mL  500 Units Intravenous Once Kefalas, Thomas S, PA-C       sodium chloride flush (NS) 0.9 % injection 10 mL  10 mL Intravenous PRN Holley Bouche, NP   10 mL at 11/05/16 1136    Allergies as of 04/13/2021   (No Known Allergies)    ROS:  General: Negative for anorexia, weight loss, fever, chills, fatigue, weakness. ENT: Negative for hoarseness, difficulty swallowing , nasal congestion. CV: Negative for chest pain, angina, palpitations, dyspnea on exertion, peripheral edema.  Respiratory: Negative for dyspnea at rest, dyspnea on exertion, cough, sputum, wheezing.  GI: See history of present illness. GU:  Negative for dysuria, hematuria, urinary incontinence, urinary frequency, nocturnal urination.  Endo: Negative for unusual weight change.    Physical Examination:   BP 114/68    Pulse 75    Temp (!) 97.3 F (36.3 C) (Temporal)    Ht 5\' 8"  (1.727 m)    Wt 140 lb 9.6 oz (63.8 kg)    BMI 21.38 kg/m   General: Well-nourished, well-developed in no acute distress.  Eyes: No icterus. Mouth: masked Abdomen: Bowel sounds are normal, nontender, nondistended, no hepatosplenomegaly or masses, no abdominal bruits or hernia , no rebound or guarding.  PEJ site healed. Extremities: No lower extremity edema. No clubbing or deformities. Neuro: Alert and oriented x 4   Skin: Warm and dry, no jaundice.   Psych: Alert and cooperative, normal mood and affect.  Labs:  Lab Results  Component Value Date   CREATININE 1.44 (H) 03/10/2021   BUN 11 03/10/2021   NA 138 03/10/2021   K  4.1 03/10/2021   CL 112 (H) 03/10/2021   CO2 19 (L) 03/10/2021   Lab Results  Component Value Date   ALT 31 03/10/2021   AST 26 03/10/2021   ALKPHOS 104 03/10/2021   BILITOT 0.2 (L) 03/10/2021   Lab Results  Component Value Date   WBC 14.6 (H) 03/10/2021   HGB 13.1 03/10/2021   HCT 41.4 03/10/2021   MCV 92.4 03/10/2021   PLT 188 03/10/2021   Lab Results  Component Value Date   IRON 129 11/27/2020   TIBC 243 (L) 11/27/2020   FERRITIN 140 11/27/2020      Imaging Studies: No results  found.   Assessment:  Diarrhea: Chronic in nature.  Likely multifactorial in the setting of chronic pancreatic insufficiency, multiple bowel resections, removal of cecum/entire colon.  Currently having 3 watery stools daily, no nocturnal stools unless he eats late.  Requires combination of Lomotil, Prevalite, Imodium, Creon to control symptoms.  Patient states that he has an appointment at [facility] in May, unclear regarding reason for referral.  Await appointment/records.  We will call in the interim if any decline in his symptoms.  GERD: Short segment Barrett's esophagus, biopsy-proven in the past but not apparent on most recent EGDs.  Plans for surveillance EGD in 2024.  History of colon cancer: Stage III.  Multiple surgeries as outlined above.  According to op notes, he has an ileorectal anastomosis at this point.  Due for surveillance study in 2023, to look at remaining rectum and anastomosis.  We will wait until he is seen at [facility] in May in case they plan for any procedures.  Anemia: History of IDA requiring iron infusions in the past, none in the past couple of years.  History of B12 deficiency chronically supplemented.  Given prior gastric surgery, resection of TI he has an increased risk of B12 deficiency, needs to be monitored.  Last EGD December 2021 with surgically altered stomach with a single efferent small bowel limb, residual stomach appeared normal,two 1 cm tongues of salmon-colored  epithelium at the GE junction with prior diagnosis of Barrett's, current pathology with findings most consistent with reflux changes.  No endoscopic evaluations since end-to-end ileorectal anastomosis July 2020.   Plan:  Await [facility] appointment/records from May 2023. Plan for sigmoidoscopy after he is seen at [facility]. Surveillance EGD for Barrett's due next year. He will continue Lomotil, Prevalite, Creon, Imodium as before. Continue pantoprazole and 40 mg twice daily.

## 2021-04-13 NOTE — Telephone Encounter (Signed)
Pt was made aware and verbalized understanding.  

## 2021-04-13 NOTE — Patient Instructions (Addendum)
Continue medications as before for diarrhea. Continue pantoprazole for reflux. I will review records from [facility] regarding barium studies. Further recommendations to follow. You will be due for sigmoidoscopy this year to look at remaining rectum. We will wait until you see GI at [facility].  Call if you have any changes or new symptoms or concerns in the meantime.

## 2021-05-05 ENCOUNTER — Other Ambulatory Visit (HOSPITAL_COMMUNITY): Payer: Self-pay | Admitting: Hematology

## 2021-05-05 DIAGNOSIS — C189 Malignant neoplasm of colon, unspecified: Secondary | ICD-10-CM

## 2021-05-05 DIAGNOSIS — R630 Anorexia: Secondary | ICD-10-CM

## 2021-06-01 ENCOUNTER — Other Ambulatory Visit (HOSPITAL_COMMUNITY): Payer: Self-pay | Admitting: Hematology

## 2021-06-01 DIAGNOSIS — R630 Anorexia: Secondary | ICD-10-CM

## 2021-06-01 DIAGNOSIS — C189 Malignant neoplasm of colon, unspecified: Secondary | ICD-10-CM

## 2021-06-08 ENCOUNTER — Encounter (HOSPITAL_COMMUNITY): Payer: Self-pay

## 2021-06-08 ENCOUNTER — Other Ambulatory Visit: Payer: Self-pay

## 2021-06-08 ENCOUNTER — Inpatient Hospital Stay (HOSPITAL_COMMUNITY): Payer: Medicare Other | Attending: Hematology

## 2021-06-08 DIAGNOSIS — Z452 Encounter for adjustment and management of vascular access device: Secondary | ICD-10-CM | POA: Diagnosis present

## 2021-06-08 DIAGNOSIS — C187 Malignant neoplasm of sigmoid colon: Secondary | ICD-10-CM | POA: Diagnosis not present

## 2021-06-08 MED ORDER — HEPARIN SOD (PORK) LOCK FLUSH 100 UNIT/ML IV SOLN
500.0000 [IU] | Freq: Once | INTRAVENOUS | Status: AC
  Administered 2021-06-08: 500 [IU] via INTRAVENOUS

## 2021-06-08 MED ORDER — SODIUM CHLORIDE 0.9% FLUSH
10.0000 mL | Freq: Once | INTRAVENOUS | Status: AC
  Administered 2021-06-08: 10 mL

## 2021-06-08 NOTE — Progress Notes (Signed)
[name] presented for Portacath access and flush.  Portacath located left chest wall accessed with  H 20 needle.  No blood return noted.  Portacath flushed with 10 ml NS and 500U/33ml Heparin and needle removed intact.  Procedure tolerated well and without incident. No complaints at this time. Discharged from clinic ambulatory in stable condition. Alert and oriented x 3. F/U with [facility] as scheduled.   ?

## 2021-06-15 MED ORDER — PANTOPRAZOLE SODIUM 40 MG PO TBEC: 40.0000 mg | DELAYED_RELEASE_TABLET | Freq: Two times a day (BID) | ORAL | 3 refills | Status: DC

## 2021-07-03 ENCOUNTER — Other Ambulatory Visit (HOSPITAL_COMMUNITY): Payer: Self-pay | Admitting: Hematology

## 2021-07-03 DIAGNOSIS — R630 Anorexia: Secondary | ICD-10-CM

## 2021-07-03 DIAGNOSIS — C189 Malignant neoplasm of colon, unspecified: Secondary | ICD-10-CM

## 2021-07-31 ENCOUNTER — Other Ambulatory Visit (HOSPITAL_COMMUNITY): Payer: Self-pay | Admitting: Hematology

## 2021-07-31 DIAGNOSIS — C189 Malignant neoplasm of colon, unspecified: Secondary | ICD-10-CM

## 2021-07-31 DIAGNOSIS — R630 Anorexia: Secondary | ICD-10-CM

## 2021-08-04 ENCOUNTER — Other Ambulatory Visit: Payer: Self-pay | Admitting: Gastroenterology

## 2021-08-04 MED ORDER — PANCRELIPASE (LIP-PROT-AMYL) 36000-114000 UNITS PO CPEP: ORAL_CAPSULE | ORAL | 3 refills | Status: DC

## 2021-08-04 MED ORDER — PREVALITE 4 G PO PACK: 4.0000 g | PACK | Freq: Every day | ORAL | 3 refills | Status: DC

## 2021-08-29 ENCOUNTER — Other Ambulatory Visit: Payer: Self-pay | Admitting: Gastroenterology

## 2021-08-31 NOTE — Telephone Encounter (Signed)
I have refilled. This is printed and needs to be faxed to pharmacy. Thanks!

## 2021-09-01 NOTE — Telephone Encounter (Signed)
Prescription was faxed to [facility] in [place].

## 2021-09-02 ENCOUNTER — Other Ambulatory Visit (HOSPITAL_COMMUNITY): Payer: Self-pay | Admitting: Physician Assistant

## 2021-09-02 DIAGNOSIS — C189 Malignant neoplasm of colon, unspecified: Secondary | ICD-10-CM

## 2021-09-02 DIAGNOSIS — R630 Anorexia: Secondary | ICD-10-CM

## 2021-09-08 ENCOUNTER — Inpatient Hospital Stay (HOSPITAL_COMMUNITY): Payer: Medicare Other | Attending: Hematology

## 2021-09-08 DIAGNOSIS — K869 Disease of pancreas, unspecified: Secondary | ICD-10-CM | POA: Insufficient documentation

## 2021-09-08 DIAGNOSIS — Z85118 Personal history of other malignant neoplasm of bronchus and lung: Secondary | ICD-10-CM | POA: Insufficient documentation

## 2021-09-08 DIAGNOSIS — Z85038 Personal history of other malignant neoplasm of large intestine: Secondary | ICD-10-CM | POA: Diagnosis present

## 2021-09-08 DIAGNOSIS — G629 Polyneuropathy, unspecified: Secondary | ICD-10-CM | POA: Diagnosis not present

## 2021-09-08 DIAGNOSIS — D508 Other iron deficiency anemias: Secondary | ICD-10-CM

## 2021-09-08 DIAGNOSIS — N189 Chronic kidney disease, unspecified: Secondary | ICD-10-CM | POA: Diagnosis not present

## 2021-09-08 DIAGNOSIS — E611 Iron deficiency: Secondary | ICD-10-CM | POA: Insufficient documentation

## 2021-09-08 DIAGNOSIS — Z87891 Personal history of nicotine dependence: Secondary | ICD-10-CM | POA: Diagnosis not present

## 2021-09-08 DIAGNOSIS — Z9049 Acquired absence of other specified parts of digestive tract: Secondary | ICD-10-CM | POA: Insufficient documentation

## 2021-09-08 DIAGNOSIS — Z86711 Personal history of pulmonary embolism: Secondary | ICD-10-CM | POA: Insufficient documentation

## 2021-09-08 DIAGNOSIS — C189 Malignant neoplasm of colon, unspecified: Secondary | ICD-10-CM

## 2021-09-08 LAB — CBC WITH DIFFERENTIAL/PLATELET
Abs Immature Granulocytes: 0.03 10*3/uL (ref 0.00–0.07)
Basophils Absolute: 0 10*3/uL (ref 0.0–0.1)
Basophils Relative: 1 %
Eosinophils Absolute: 0.1 10*3/uL (ref 0.0–0.5)
Eosinophils Relative: 1 %
HCT: 38 % — ABNORMAL LOW (ref 39.0–52.0)
Hemoglobin: 12.5 g/dL — ABNORMAL LOW (ref 13.0–17.0)
Immature Granulocytes: 0 %
Lymphocytes Relative: 24 %
Lymphs Abs: 1.8 10*3/uL (ref 0.7–4.0)
MCH: 30.3 pg (ref 26.0–34.0)
MCHC: 32.9 g/dL (ref 30.0–36.0)
MCV: 92 fL (ref 80.0–100.0)
Monocytes Absolute: 0.7 10*3/uL (ref 0.1–1.0)
Monocytes Relative: 10 %
Neutro Abs: 4.9 10*3/uL (ref 1.7–7.7)
Neutrophils Relative %: 64 %
Platelets: 116 10*3/uL — ABNORMAL LOW (ref 150–400)
RBC: 4.13 MIL/uL — ABNORMAL LOW (ref 4.22–5.81)
RDW: 16.1 % — ABNORMAL HIGH (ref 11.5–15.5)
WBC: 7.6 10*3/uL (ref 4.0–10.5)
nRBC: 0 % (ref 0.0–0.2)

## 2021-09-08 LAB — IRON AND TIBC
Iron: 59 ug/dL (ref 45–182)
Saturation Ratios: 20 % (ref 17.9–39.5)
TIBC: 296 ug/dL (ref 250–450)
UIBC: 237 ug/dL

## 2021-09-08 LAB — FERRITIN: Ferritin: 79 ng/mL (ref 24–336)

## 2021-09-08 LAB — COMPREHENSIVE METABOLIC PANEL
ALT: 16 U/L (ref 0–44)
AST: 15 U/L (ref 15–41)
Albumin: 3.2 g/dL — ABNORMAL LOW (ref 3.5–5.0)
Alkaline Phosphatase: 65 U/L (ref 38–126)
Anion gap: 6 (ref 5–15)
BUN: 15 mg/dL (ref 8–23)
CO2: 18 mmol/L — ABNORMAL LOW (ref 22–32)
Calcium: 8.2 mg/dL — ABNORMAL LOW (ref 8.9–10.3)
Chloride: 113 mmol/L — ABNORMAL HIGH (ref 98–111)
Creatinine, Ser: 1.45 mg/dL — ABNORMAL HIGH (ref 0.61–1.24)
GFR, Estimated: 52 mL/min — ABNORMAL LOW (ref >60)
Glucose, Bld: 108 mg/dL — ABNORMAL HIGH (ref 70–99)
Potassium: 4.2 mmol/L (ref 3.5–5.1)
Sodium: 137 mmol/L (ref 135–145)
Total Bilirubin: 0.5 mg/dL (ref 0.3–1.2)
Total Protein: 5.9 g/dL — ABNORMAL LOW (ref 6.5–8.1)

## 2021-09-08 MED ORDER — SODIUM CHLORIDE 0.9% FLUSH
10.0000 mL | INTRAVENOUS | Status: DC | PRN
  Administered 2021-09-08: 10 mL via INTRAVENOUS

## 2021-09-08 MED ORDER — HEPARIN SOD (PORK) LOCK FLUSH 100 UNIT/ML IV SOLN
500.0000 [IU] | Freq: Once | INTRAVENOUS | Status: AC
  Administered 2021-09-08: 500 [IU] via INTRAVENOUS

## 2021-09-08 NOTE — Progress Notes (Signed)
[name] presented for Portacath access and flush. Portacath located left chest wall accessed with  H 20 needle. Good blood return present. Portacath flushed with 24ml NS and 500U/36ml Heparin and needle removed intact. Procedure without incident. Patient tolerated procedure well.  Labs drawn per orders.    Vitals stable and discharged home from clinic ambulatory. Follow up as scheduled.

## 2021-09-08 NOTE — Patient Instructions (Signed)
[facility]  Discharge Instructions: Thank you for choosing [facility] to provide your oncology and hematology care.  If you have a lab appointment with the Cancer Center, please come in thru the Main Entrance and check in at the main information desk.  Wear comfortable clothing and clothing appropriate for easy access to any Portacath or PICC line.   We strive to give you quality time with your provider. You may need to reschedule your appointment if you arrive late (15 or more minutes).  Arriving late affects you and other patients whose appointments are after yours.  Also, if you miss three or more appointments without notifying the office, you may be dismissed from the clinic at the provider's discretion.      For prescription refill requests, have your pharmacy contact our office and allow 72 hours for refills to be completed.         To help prevent nausea and vomiting after your treatment, we encourage you to take your nausea medication as directed.  BELOW ARE SYMPTOMS THAT SHOULD BE REPORTED IMMEDIATELY: *FEVER GREATER THAN 100.4 F (38 C) OR HIGHER *CHILLS OR SWEATING *NAUSEA AND VOMITING THAT IS NOT CONTROLLED WITH YOUR NAUSEA MEDICATION *UNUSUAL SHORTNESS OF BREATH *UNUSUAL BRUISING OR BLEEDING *URINARY PROBLEMS (pain or burning when urinating, or frequent urination) *BOWEL PROBLEMS (unusual diarrhea, constipation, pain near the anus) TENDERNESS IN MOUTH AND THROAT WITH OR WITHOUT PRESENCE OF ULCERS (sore throat, sores in mouth, or a toothache) UNUSUAL RASH, SWELLING OR PAIN  UNUSUAL VAGINAL DISCHARGE OR ITCHING   Items with * indicate a potential emergency and should be followed up as soon as possible or go to the Emergency Department if any problems should occur.  Please show the CHEMOTHERAPY ALERT CARD or IMMUNOTHERAPY ALERT CARD at check-in to the Emergency Department and triage nurse.  Should you have questions after your visit or need to  cancel or reschedule your appointment, please contact [facility] [phone]  and follow the prompts.  Office hours are 8:00 a.m. to 4:30 p.m. Monday - Friday. Please note that voicemails left after 4:00 p.m. may not be returned until the following business day.  We are closed weekends and major holidays. You have access to a nurse at all times for urgent questions. Please call the main number to the clinic [phone] and follow the prompts.  For any non-urgent questions, you may also contact your provider using MyChart. We now offer e-Visits for anyone 18 and older to request care online for non-urgent symptoms. For details visit [email or web address].   Also download the MyChart app! Go to the app store, search "MyChart", open the app, select [facility], and log in with your MyChart username and password.  Masks are optional in the cancer centers. If you would like for your care team to wear a mask while they are taking care of you, please let them know. For doctor visits, patients may have with them one support person who is at least 71 years old. At this time, visitors are not allowed in the infusion area.  

## 2021-09-09 LAB — CEA: CEA: 3.2 ng/mL (ref 0.0–4.7)

## 2021-09-15 ENCOUNTER — Encounter (HOSPITAL_COMMUNITY): Payer: Self-pay | Admitting: Hematology

## 2021-09-15 ENCOUNTER — Inpatient Hospital Stay (HOSPITAL_BASED_OUTPATIENT_CLINIC_OR_DEPARTMENT_OTHER): Payer: Medicare Other | Admitting: Hematology

## 2021-09-15 VITALS — BP 102/65 | HR 72 | Temp 97.9°F | Resp 18 | Ht 67.72 in | Wt 125.2 lb

## 2021-09-15 DIAGNOSIS — Z85038 Personal history of other malignant neoplasm of large intestine: Secondary | ICD-10-CM | POA: Diagnosis not present

## 2021-09-15 DIAGNOSIS — D508 Other iron deficiency anemias: Secondary | ICD-10-CM

## 2021-09-15 DIAGNOSIS — C189 Malignant neoplasm of colon, unspecified: Secondary | ICD-10-CM

## 2021-09-21 ENCOUNTER — Other Ambulatory Visit (HOSPITAL_COMMUNITY): Payer: Self-pay | Admitting: Hematology

## 2021-09-23 ENCOUNTER — Other Ambulatory Visit (HOSPITAL_COMMUNITY): Payer: Self-pay | Admitting: Hematology

## 2021-09-23 ENCOUNTER — Other Ambulatory Visit (HOSPITAL_COMMUNITY): Payer: Self-pay

## 2021-09-23 ENCOUNTER — Encounter (HOSPITAL_COMMUNITY): Payer: Self-pay | Admitting: Hematology

## 2021-09-23 DIAGNOSIS — C259 Malignant neoplasm of pancreas, unspecified: Secondary | ICD-10-CM

## 2021-09-23 DIAGNOSIS — C189 Malignant neoplasm of colon, unspecified: Secondary | ICD-10-CM

## 2021-09-24 ENCOUNTER — Other Ambulatory Visit (HOSPITAL_COMMUNITY): Payer: Self-pay | Admitting: *Deleted

## 2021-10-01 ENCOUNTER — Inpatient Hospital Stay (HOSPITAL_COMMUNITY): Payer: Medicare Other | Attending: Hematology

## 2021-10-01 ENCOUNTER — Ambulatory Visit (HOSPITAL_COMMUNITY)
Admission: RE | Admit: 2021-10-01 | Discharge: 2021-10-01 | Disposition: A | Discharge status: Home / Self Care | Payer: Medicare Other | Source: Ambulatory Visit | Attending: Hematology | Admitting: Hematology

## 2021-10-01 DIAGNOSIS — J439 Emphysema, unspecified: Secondary | ICD-10-CM | POA: Diagnosis not present

## 2021-10-01 DIAGNOSIS — R937 Abnormal findings on diagnostic imaging of other parts of musculoskeletal system: Secondary | ICD-10-CM | POA: Insufficient documentation

## 2021-10-01 DIAGNOSIS — C189 Malignant neoplasm of colon, unspecified: Secondary | ICD-10-CM

## 2021-10-01 DIAGNOSIS — C259 Malignant neoplasm of pancreas, unspecified: Secondary | ICD-10-CM | POA: Insufficient documentation

## 2021-10-01 DIAGNOSIS — I700 Atherosclerosis of aorta: Secondary | ICD-10-CM | POA: Diagnosis not present

## 2021-10-01 DIAGNOSIS — R590 Localized enlarged lymph nodes: Secondary | ICD-10-CM | POA: Insufficient documentation

## 2021-10-01 LAB — COMPREHENSIVE METABOLIC PANEL
ALT: 13 U/L (ref 0–44)
AST: 12 U/L — ABNORMAL LOW (ref 15–41)
Albumin: 2.8 g/dL — ABNORMAL LOW (ref 3.5–5.0)
Alkaline Phosphatase: 72 U/L (ref 38–126)
Anion gap: 5 (ref 5–15)
BUN: 13 mg/dL (ref 8–23)
CO2: 25 mmol/L (ref 22–32)
Calcium: 7.8 mg/dL — ABNORMAL LOW (ref 8.9–10.3)
Chloride: 105 mmol/L (ref 98–111)
Creatinine, Ser: 1.72 mg/dL — ABNORMAL HIGH (ref 0.61–1.24)
GFR, Estimated: 42 mL/min — ABNORMAL LOW (ref >60)
Glucose, Bld: 92 mg/dL (ref 70–99)
Potassium: 4 mmol/L (ref 3.5–5.1)
Sodium: 135 mmol/L (ref 135–145)
Total Bilirubin: 0.5 mg/dL (ref 0.3–1.2)
Total Protein: 5.8 g/dL — ABNORMAL LOW (ref 6.5–8.1)

## 2021-10-01 LAB — CBC WITH DIFFERENTIAL/PLATELET
Abs Immature Granulocytes: 0.02 10*3/uL (ref 0.00–0.07)
Basophils Absolute: 0 10*3/uL (ref 0.0–0.1)
Basophils Relative: 0 %
Eosinophils Absolute: 0.2 10*3/uL (ref 0.0–0.5)
Eosinophils Relative: 2 %
HCT: 36.7 % — ABNORMAL LOW (ref 39.0–52.0)
Hemoglobin: 12 g/dL — ABNORMAL LOW (ref 13.0–17.0)
Immature Granulocytes: 0 %
Lymphocytes Relative: 24 %
Lymphs Abs: 1.7 10*3/uL (ref 0.7–4.0)
MCH: 30.5 pg (ref 26.0–34.0)
MCHC: 32.7 g/dL (ref 30.0–36.0)
MCV: 93.4 fL (ref 80.0–100.0)
Monocytes Absolute: 0.9 10*3/uL (ref 0.1–1.0)
Monocytes Relative: 13 %
Neutro Abs: 4.1 10*3/uL (ref 1.7–7.7)
Neutrophils Relative %: 61 %
Platelets: 121 10*3/uL — ABNORMAL LOW (ref 150–400)
RBC: 3.93 MIL/uL — ABNORMAL LOW (ref 4.22–5.81)
RDW: 14.1 % (ref 11.5–15.5)
WBC: 7 10*3/uL (ref 4.0–10.5)
nRBC: 0 % (ref 0.0–0.2)

## 2021-10-01 LAB — MAGNESIUM: Magnesium: 1.8 mg/dL (ref 1.7–2.4)

## 2021-10-01 MED ORDER — FLUDEOXYGLUCOSE F - 18 (FDG) INJECTION
6.4400 | Freq: Once | INTRAVENOUS | Status: AC | PRN
  Administered 2021-10-01: 6.44 via INTRAVENOUS

## 2021-10-01 MED ORDER — HEPARIN SOD (PORK) LOCK FLUSH 100 UNIT/ML IV SOLN
INTRAVENOUS | Status: AC
  Administered 2021-10-01: 500 [IU]
  Filled 2021-10-01: qty 5

## 2021-10-01 MED ORDER — SODIUM CHLORIDE FLUSH 0.9 % IV SOLN
INTRAVENOUS | Status: AC
  Administered 2021-10-01: 10 mL via INTRAVENOUS
  Filled 2021-10-01: qty 10

## 2021-10-01 NOTE — Progress Notes (Signed)
[name] presented for Portacath access and flush with.  Portacath located left chest wall accessed with  H 20 needle.  Good blood return present. Portacath flushed with 14ml NS and 500U/28ml Heparin and needle removed intact.  Procedure tolerated well and without incident. Pt left to go to Radiography for PET scan.

## 2021-10-02 LAB — CANCER ANTIGEN 19-9: CA 19-9: <2 U/mL (ref 0–35)

## 2021-10-06 ENCOUNTER — Encounter (HOSPITAL_COMMUNITY): Payer: Self-pay

## 2021-10-06 NOTE — Progress Notes (Signed)
Introductory phone call placed to patient today. Introduced myself and explained my role in the patient's care. Provided a reminder of patient's upcoming appt. Patient made aware of clinic's location and visitor policy. No further questions at this time per patient. I will plan to meet with the patient tomorrow during initial visit with Dr. Delton Coombes.

## 2021-10-07 ENCOUNTER — Encounter (HOSPITAL_COMMUNITY): Payer: Self-pay | Admitting: Hematology

## 2021-10-07 ENCOUNTER — Inpatient Hospital Stay (HOSPITAL_BASED_OUTPATIENT_CLINIC_OR_DEPARTMENT_OTHER): Payer: Medicare Other | Admitting: Hematology

## 2021-10-07 DIAGNOSIS — C252 Malignant neoplasm of tail of pancreas: Secondary | ICD-10-CM

## 2021-10-07 DIAGNOSIS — G629 Polyneuropathy, unspecified: Secondary | ICD-10-CM | POA: Insufficient documentation

## 2021-10-07 DIAGNOSIS — R634 Abnormal weight loss: Secondary | ICD-10-CM | POA: Diagnosis not present

## 2021-10-07 DIAGNOSIS — Z85038 Personal history of other malignant neoplasm of large intestine: Secondary | ICD-10-CM

## 2021-10-07 DIAGNOSIS — C259 Malignant neoplasm of pancreas, unspecified: Secondary | ICD-10-CM | POA: Insufficient documentation

## 2021-10-07 DIAGNOSIS — R1012 Left upper quadrant pain: Secondary | ICD-10-CM | POA: Insufficient documentation

## 2021-10-07 DIAGNOSIS — E611 Iron deficiency: Secondary | ICD-10-CM

## 2021-10-07 DIAGNOSIS — Z87891 Personal history of nicotine dependence: Secondary | ICD-10-CM | POA: Diagnosis not present

## 2021-10-07 DIAGNOSIS — R197 Diarrhea, unspecified: Secondary | ICD-10-CM | POA: Insufficient documentation

## 2021-10-07 NOTE — Progress Notes (Signed)
START ON PATHWAY REGIMEN - Pancreatic Adenocarcinoma     A cycle is every 14 days:     Irinotecan      Oxaliplatin      Leucovorin      Fluorouracil   **Always confirm dose/schedule in your pharmacy ordering system**  Patient Characteristics: Preoperative, M0 (Clinical Staging), Resectable, Neoadjuvant Therapy Planned, BRCA1/2 and PALB2 Mutation Absent/Unknown Therapeutic Status: Preoperative, M0 (Clinical Staging) AJCC T Category: cT1 AJCC N Category: cN1 Resectability Status: Resectable AJCC M Category: cM0 AJCC 8 Stage Grouping: IIB BRCA1/2 Mutation Status: Awaiting Test Results PALB2 Mutation Status: Awaiting Test Results Intent of Therapy: Curative Intent, Discussed with Patient

## 2021-10-07 NOTE — Patient Instructions (Addendum)
[facility] Discharge Instructions  You were seen and examined today by Dr. Delton Coombes. Dr. Delton Coombes is a medical oncologist, meaning that he specializes in the treatment of cancer diagnoses. Dr. Delton Coombes discussed your past medical history, family history of cancers, and the events that led to you being here today.  You were referred back to Dr. Delton Coombes for a new diagnosis of pancreatic cancer. It does appear to be localized to the pancreas and nearby lymph nodes making it a Stage II Cancer.   The best course of treatment is surgery. During surgery, they will remove the tail of the pancreas as well as the spleen. Even with surgery, you will need chemotherapy to prepare the area for surgery by shrinking it. You will likely need chemotherapy following surgery as well.  Due to your personal history, Dr. Delton Coombes has also referred you for genetic testing.  Thank you for choosing [facility] to provide your oncology and hematology care.  To afford each patient quality time with our provider, please arrive at least 15 minutes before your scheduled appointment time.   If you have a lab appointment with the [facility] please come in thru the Main Entrance and check in at the main information desk.  You need to re-schedule your appointment should you arrive 10 or more minutes late.  We strive to give you quality time with our providers, and arriving late affects you and other patients whose appointments are after yours.  Also, if you no show three or more times for appointments you may be dismissed from the clinic at the providers discretion.     Again, thank you for choosing [facility].  Our hope is that these requests will decrease the amount of time that you wait before being seen by our physicians.       _____________________________________________________________  Should you have questions after your  visit to [facility], please contact our office at [phone] and follow the prompts.  Our office hours are 8:00 a.m. and 4:30 p.m. Monday - Friday.  Please note that voicemails left after 4:00 p.m. may not be returned until the following business day.  We are closed weekends and major holidays.  You do have access to a nurse 24-7, just call the main number to the clinic [phone] and do not press any options, hold on the line and a nurse will answer the phone.    For prescription refill requests, have your pharmacy contact our office and allow 72 hours.

## 2021-10-07 NOTE — Progress Notes (Signed)
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 71 y.o. 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 Ella Jubilee.  Today he reports feeling well, and he is accompanied by his wife. He reports he has unintentionally lost 15 lbs in the past 4 months. He reports constant LUQ abdominal pain which he rates 6 out of 10; he denies any correlation with eating. He reports occasional nausea and denies vomiting. He is not drinking Boost or Ensure as he reports it causes diarrhea. His appetite is low, and he is eating smaller portions than usual. His wife reports he is eating 1 meal a day. He reports occasional tingling in his feet over the past 6 months, and he denies associated pain.   Prior to retirement he worked in the [employer]. He denies chemical exposure. He quit smoking in 2014 after smoking 1 ppd for 44 years. A paternal cousin had "bone cancer", but he otherwise denies family history of cancer.    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   Stage 3   Anemia    Anxiety    Arthritis    Barrett's esophagus    Blood transfusion    Bowel obstruction ([place]) 05/13/2012   Recurrent   Bronchitis    Chest pain at rest    Chronic abdominal pain    Erosive esophagitis    ETOH abuse    quit 03/2010   GERD (gastroesophageal  reflux disease)    Hx of Clostridium difficile infection 01/2012   Hypertension    Ileus ([place])    Iron deficiency anemia 03/23/2016   Lung cancer ([place])    Obstruction of bowel ([place]) 03/03/2014   Osteoporosis    Personal history of PE (pulmonary embolism) 10/01/2010   Pneumonia    Pulmonary embolism ([place]) 02/2010   Recurrent upper respiratory infection (URI)    Renal disorder    S/P endoscopy 09/28/2010   erosive reflux esophagitis, Billroth I anatomy   S/P partial gastrectomy 1980s   Seizures ([place])    was on meds for 6 months, Unknown etiology, last seizure was 2017.   Shortness of breath    TIA (transient ischemic attack) 12/2009   Vitamin B12 deficiency     SURGICAL HISTORY: Past Surgical History:  Procedure Laterality Date   ABDOMINAL ADHESION SURGERY  03/04/15   @ [facility]   ABDOMINAL EXPLORATION SURGERY     abdominal sugery     for bowel obstruction x 8, all in 1980s, except for one in 07/2010   APPENDECTOMY  1980s   Billroth 1 hemigastrectomy  1980s   per patient for benign duodenal tumor   CARDIAC CATHETERIZATION  07/17/2012   CHOLECYSTECTOMY  1980s   COLON SURGERY  May 2012   left hemicolectomy, colon cancer found at time of surgery for bowel obstruction   COLON SURGERY  10/12/2018   At [facility]: Resection of right colon and terminal  ileum with creation of ileorectal anastomosis for large bowel obstruction.  Cecum and ascending colon noted to be attached to the rectum.   COLONOSCOPY  03/18/2011   anastomosis at 35cm. Several adenomatous polyps removed. Sigmoid diverticulosis. Next TCS 02/2013   COLONOSCOPY N/A 07/24/2012   FWY:OVZCHY post segmental resection with normal-appearing colonic anastomosis aside from an adjacent polyp-removed as described above. Rectal polyp-removed as described above. CT findings appear to have been artifactual. tubular adenomas/prolapsed type polyp.   COLONOSCOPY N/A 05/15/2015   Procedure: COLONOSCOPY;  Surgeon: [name], MD;  Location: [facility];  Service: Endoscopy;  Laterality: N/A;   COLONOSCOPY  09/26/2018   at [facility]: prep not adequate for colon cancer surveillance.  Prior end-to-end colonic anastomosis in the rectosigmoid region.  This was patent and characterized by healthy-appearing mucosa.  Anastomosis was traversed.  Normal terminal ileum.   COLONOSCOPY WITH PROPOFOL N/A 11/04/2016   Dr. [name], status post subtotal colectomy with normal-appearing residual lower GI tract.  Next colonoscopy in 5 years.   ESOPHAGOGASTRODUODENOSCOPY  09/28/2010   ESOPHAGOGASTRODUODENOSCOPY  12/01/2010   Cervical web status post dilation, erosive esophagitis, B1 hemigastrectomy, inflamed anastomosis   ESOPHAGOGASTRODUODENOSCOPY  04/16/2011   excoriation at [facility] c/w trauma/M-W tear, friable gastric anastomosis, dilation efferent limb   ESOPHAGOGASTRODUODENOSCOPY N/A 06/03/2014   Dr.[name]- cervcal esopphageal web s/p dilation. abnormal distal esophagus bx= barretts esophagus   ESOPHAGOGASTRODUODENOSCOPY N/A 05/15/2015   Procedure: ESOPHAGOGASTRODUODENOSCOPY (EGD);  Surgeon: [name], MD;  Location[facility];  Service: Endoscopy;  Laterality: N/A;  230   ESOPHAGOGASTRODUODENOSCOPY (EGD) WITH ESOPHAGEAL DILATION  02/25/2012   IFO:YDXAJOIN esophageal web-s/p dilation anddisruption as described above. Status post prior gastric with Billroth I configuration. Abnormal gastric mucosa at the anastomosis. Gastric biopsy showed mild chronic inflammation but no H. pylori    ESOPHAGOGASTRODUODENOSCOPY (EGD) WITH PROPOFOL N/A 11/04/2016   Dr. [name]: Reflux esophagitis, small hiatal hernia status post hemigastrectomy.  Single patent efferent small bowel limb appeared normal, 2 x 2 centimeter tongue of salmon epithelium again seen, esophageal dilation.  Biopsies consistent with reflux changes, not Barrett's however this was confirmed on prior EGDs.  Offer 3-year follow-up EGD August 2021.   ESOPHAGOGASTRODUODENOSCOPY (EGD) WITH PROPOFOL N/A 03/03/2020    Procedure: ESOPHAGOGASTRODUODENOSCOPY (EGD) WITH PROPOFOL;  Surgeon: [name], MD;  [facility];  Service: Endoscopy;  Laterality: N/A;  3:00pm   HERNIA REPAIR     right inguinal   MALONEY DILATION N/A 06/03/2014   Procedure: [name] DILATION;  Surgeon: [name], MD;  Location: [facility];  Service: Endoscopy;  Laterality: N/A;   MALONEY DILATION N/A 05/15/2015   Procedure: [name] DILATION;  Surgeon: [name], MD;  Location: [facility];  Service: Endoscopy;  Laterality: N/A;   MALONEY DILATION N/A 11/04/2016   Procedure: [name] DILATION;  Surgeon: [name], MD;  [facility];  Service: Endoscopy;  Laterality: N/A;   MALONEY DILATION N/A 03/03/2020   Procedure: [name] DILATION;  Surgeon: [name], MD;  Location[facility];  Service: Endoscopy;  Laterality: N/A;   PORTACATH PLACEMENT     SAVORY DILATION N/A 06/03/2014   Procedure: SAVORY DILATION;  Surgeon: [name], MD;  Location: [facility];  Service: Endoscopy;  Laterality: N/A;    SOCIAL HISTORY: Social History   Socioeconomic History   Marital status: Married    Spouse name: Not on file   Number of children: 3   Years of education: Not  on file   Highest education level: Not on file  Occupational History    Employer: [employer]  Tobacco Use   Smoking status: Former    Packs/day: 0.50    Years: 40.00    Total pack years: 20.00    Types: Cigarettes    Quit date: 12/20/2012    Years since quitting: 8.8   Smokeless tobacco: Never  Vaping Use   Vaping Use: Never used  Substance and Sexual Activity   Alcohol use: No   Drug use: No   Sexual activity: Never  Other Topics Concern   Not on file  Social History Narrative   Not on file   Social Determinants of Health   Financial Resource Strain: Not on file  Food Insecurity: Not on file  Transportation Needs: Not on file  Physical Activity: Not on file  Stress: Not on file  Social Connections: Not  on file  Intimate Partner Violence: Not on file    FAMILY HISTORY: Family History  Problem Relation Age of Onset   Hypertension Mother    Arthritis Mother    Pneumonia Mother    Hypertension Father    Heart attack Father    Colon cancer Neg Hx     ALLERGIES:  has No Known Allergies.  MEDICATIONS:  Current Outpatient Medications  Medication Sig Dispense Refill   ammonium lactate (AMLACTIN) 12 % cream Apply 1 g topically See admin instructions. APPLY CREAM TO DRY SKIN ON LEGS ARMS HANDS AND BACK AREA ONCE TO TWICE DAILY (AVOID FACE GROIN UNDERARMS)  3   atorvastatin (LIPITOR) 20 MG tablet Take by mouth.     Bacillus Coagulans-Inulin (PROBIOTIC) 1-250 BILLION-MG CAPS Take by mouth.     baclofen (LIORESAL) 10 MG tablet as needed.     carvedilol (COREG) 25 MG tablet Take 0.5 tablets (12.5 mg total) by mouth 2 (two) times daily with a meal. (Patient taking differently: Take 25 mg by mouth 2 (two) times daily with a meal. Takes 25 mg 2 times daily.) 15 tablet 2   Cholecalciferol (VITAMIN D) 2000 units CAPS Take 2,000 Units by mouth daily.     Cholecalciferol (VITAMIN D3) 10 MCG (400 UNIT) tablet Take by mouth.     cyanocobalamin 100 MCG tablet Take by mouth.     diclofenac Sodium (VOLTAREN) 1 % GEL Apply topically.     diphenoxylate-atropine (LOMOTIL) 2.5-0.025 MG tablet TAKE 1 TABLET BY MOUTH 4 TIMES A DAY 120 tablet 1   enoxaparin (LOVENOX) 60 MG/0.6ML injection Inject 60 mg into the skin daily.     ferrous sulfate 325 (65 FE) MG EC tablet Take by mouth.     gabapentin (NEURONTIN) 100 MG capsule TAKE 1 CAPSULE BY MOUTH EVERY DAY AT BEDTIME 30 capsule 0   lidocaine-prilocaine (EMLA) cream Apply 1 Application topically as needed.     loperamide (IMODIUM A-D) 2 MG tablet Take 2 mg by mouth in the morning and at bedtime.     mirtazapine (REMERON) 30 MG tablet Take 30 mg by mouth at bedtime.     Multiple Vitamin (MULTIVITAMIN WITH MINERALS) TABS tablet Take 1 tablet by mouth daily.      ondansetron (ZOFRAN-ODT) 4 MG disintegrating tablet TAKE 1 TABLET BY MOUTH EVERY 4 HOURS AS NEEDED FOR NAUSEA AND VOMITING 30 tablet 1   Pancrelipase, Lip-Prot-Amyl, (CREON PO) Take 3 capsules by mouth with meals, and take 2 capsules with snacks     pantoprazole (PROTONIX) 40 MG tablet Take 1 tablet (40 mg  total) by mouth 2 (two) times daily before a meal. 180 tablet 3   PREVALITE 4 g packet Take 1 packet (4 g total) by mouth daily. DO NOT TAKE WITHIN 2 HOURS OF OTHER MEDICATIONS 90 packet 3   prochlorperazine (COMPAZINE) 10 MG tablet Take by mouth.     sucralfate (CARAFATE) 1 GM/10ML suspension TAKE 10 MLS (1 G TOTAL) BY MOUTH 4 (FOUR) TIMES DAILY AS NEEDED. FOR BREAKTHROUGH HEARTBURN 420 mL 5   traMADol (ULTRAM) 50 MG tablet Take 1 tablet by mouth every 6 (six) hours as needed.     No current facility-administered medications for this visit.   Facility-Administered Medications Ordered in Other Visits  Medication Dose Route Frequency Provider Last Rate Last Admin   heparin lock flush 100 unit/mL  500 Units Intravenous Once Kefalas, Thomas S, PA-C       sodium chloride flush (NS) 0.9 % injection 10 mL  10 mL Intravenous PRN Holley Bouche, NP   10 mL at 11/05/16 1136    REVIEW OF SYSTEMS:   Review of Systems  Constitutional:  Positive for fatigue and unexpected weight change (-15 lbs). Negative for appetite change.  Gastrointestinal:  Positive for abdominal pain (upper L), diarrhea and nausea.  Musculoskeletal:  Positive for back pain.  Neurological:  Positive for numbness (feet).  All other systems reviewed and are negative.    PHYSICAL EXAMINATION: ECOG PERFORMANCE STATUS: 1 - Symptomatic but completely ambulatory  Vitals:   10/07/21 1321  BP: 101/67  Pulse: 67  Resp: 16  Temp: (!) 97.5 F (36.4 C)  SpO2: 98%   Filed Weights   10/07/21 1321  Weight: 124 lb 8 oz (56.5 kg)   Physical Exam Vitals reviewed.  Constitutional:      Appearance: Normal appearance.   Cardiovascular:     Rate and Rhythm: Normal rate and regular rhythm.     Pulses: Normal pulses.     Heart sounds: Normal heart sounds.  Pulmonary:     Effort: Pulmonary effort is normal.     Breath sounds: Normal breath sounds.  Abdominal:     Palpations: Abdomen is soft. There is no hepatomegaly, splenomegaly or mass.     Tenderness: There is no abdominal tenderness.  Musculoskeletal:     Right lower leg: No edema.     Left lower leg: No edema.  Lymphadenopathy:     Upper Body:     Right upper body: No supraclavicular adenopathy.     Left upper body: No supraclavicular adenopathy.     Lower Body: No right inguinal adenopathy. No left inguinal adenopathy.  Neurological:     General: No focal deficit present.     Mental Status: He is alert and oriented to person, place, and time.  Psychiatric:        Mood and Affect: Mood normal.        Behavior: Behavior normal.      LABORATORY DATA:  I have reviewed the data as listed    Latest Ref Rng & Units 10/01/2021    7:56 AM 09/08/2021   11:15 AM 03/10/2021   10:49 AM  CBC  WBC 4.0 - 10.5 K/uL 7.0  7.6  14.6   Hemoglobin 13.0 - 17.0 g/dL 12.0  12.5  13.1   Hematocrit 39.0 - 52.0 % 36.7  38.0  41.4   Platelets 150 - 400 K/uL 121  116  188       Latest Ref Rng & Units 10/01/2021    7:56  AM 09/08/2021   11:15 AM 03/10/2021   10:49 AM  CMP  Glucose 70 - 99 mg/dL 92  108  103   BUN 8 - 23 mg/dL 13  15  11    Creatinine 0.61 - 1.24 mg/dL 1.72  1.45  1.44   Sodium 135 - 145 mmol/L 135  137  138   Potassium 3.5 - 5.1 mmol/L 4.0  4.2  4.1   Chloride 98 - 111 mmol/L 105  113  112   CO2 22 - 32 mmol/L 25  18  19    Calcium 8.9 - 10.3 mg/dL 7.8  8.2  8.2   Total Protein 6.5 - 8.1 g/dL 5.8  5.9  6.4   Total Bilirubin 0.3 - 1.2 mg/dL 0.5  0.5  0.2   Alkaline Phos 38 - 126 U/L 72  65  104   AST 15 - 41 U/L 12  15  26    ALT 0 - 44 U/L 13  16  31      RADIOGRAPHIC STUDIES: I have personally reviewed the radiological images as listed  and agreed with the findings in the report. NM PET Image Initial (PI) Skull Base To Thigh (F-18 FDG)  Result Date: 10/02/2021 CLINICAL DATA:  Initial treatment strategy for pancreatic carcinoma. EXAM: NUCLEAR MEDICINE PET SKULL BASE TO THIGH TECHNIQUE: 6.4 mCi F-18 FDG was injected intravenously. Full-ring PET imaging was performed from the skull base to thigh after the radiotracer. CT data was obtained and used for attenuation correction and anatomic localization. Fasting blood glucose: 101 mg/dl COMPARISON:  None Available. FINDINGS: Mediastinal blood-pool activity (background): SUV max = 1.4 Liver activity (reference): SUV max = N/A NECK:  No hypermetabolic lymph nodes or masses. Incidental CT findings:  None. CHEST: 9 mm mediastinal lymph node in the AP window is hypermetabolic, with SUV max of 4.9. Borderline enlarged left hilar lymph nodes also show hypermetabolic activity with SUV max of 3.4. Previously seen subpleural nodule in the superior right lower lobe has nearly completely resolved since previous study and shows no significant FDG uptake. No other suspicious pulmonary nodules seen on CT images. Incidental CT findings: Stable postop changes in left lung. Mild centrilobular emphysema. ABDOMEN/PELVIS: Ill-defined mass is seen in the left upper quadrant in the region of the pancreatic tail, measuring 4.1 x 3.1 cm on image 110/3 this shows mild hypermetabolism, with SUV max of 4.7. No abnormal hypermetabolic activity within the liver, adrenal glands, or spleen. A soft tissue nodule is seen in the gastrohepatic ligament measuring 2.0 x 1.3 cm on image 22/3, which shows hypermetabolic activity SUV max of 4.0. This is suspicious for metastatic lymphadenopathy. No other hypermetabolic lymphadenopathy seen within the abdomen or pelvis. Incidental CT findings: Aortic atherosclerotic calcification incidentally noted. Benign-appearing right renal cysts shows absence of FDG uptake. SKELETON: No focal  hypermetabolic bone lesions to suggest skeletal metastasis. Incidental CT findings:  None. IMPRESSION: Ill-defined 4 cm mass in the left upper quadrant in the region of the pancreatic tail, with mild hypermetabolism, consistent with known pancreatic carcinoma. 2 cm soft tissue nodule in the gastrohepatic ligament shows mild hypermetabolic activity, suspicious for metastatic lymphadenopathy. Mild hypermetabolic mediastinal and left hilar lymph nodes, which are nonspecific. These may be reactive in etiology, although metastatic disease cannot definitely be excluded. Recommend continued follow-up by chest CT in 3 months. Previously seen subpleural nodule in the superior right lower lobe has nearly completely resolved shows no significant FDG uptake, consistent with resolving inflammatory etiology. Recommend continued attention on follow-up CT.  Aortic Atherosclerosis (ICD10-I70.0) and Emphysema (ICD10-J43.9). Electronically Signed   By: Marlaine Hind M.D.   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  No lymphadenopathy. - EUS and FNA (09/21/2021): Oval mass in the pancreatic tail, hypoechoic, 20 mm x 17 mm.  Remainder of the pancreas shows decreased visualization of the pancreas. - Pathology: Invasive adenocarcinoma, ductal type, moderately to poorly differentiated.  Background desmoplastic stroma, blood and pancreatic tissue.  Morphologically distinct from prior lung adenocarcinoma.  TTF-1 and Napsin A were negative. - Patient evaluated by Dr. Dayton Scrape at [facility] for pancreatic resection. - CA 19-9 (10/02/2021): 6.47 - PET CT scan (10/02/2021): Ill-defined 4 cm mass in the left upper quadrant in the region of the pancreatic tail with mild hypermetabolism, SUV 4.7.  Soft tissue nodule in the gastrohepatic ligament measuring 2 x 1.3 cm, SUV 4.0.   Suspicious for metastatic lymphadenopathy.  9 mm mediastinal lymph node and AP window is hypermetabolic SUV 4.9.  Borderline enlarged left hilar lymph nodes show hypermetabolic activity SUV 3.4.  Previously seen subpleural nodule in the superior right lower lobe has nearly completely resolved.  Overall mild hypermetabolic mediastinal and left hilar lymph nodes which are nonspecific.  May be reactive in etiology although metastatic disease cannot definitely be excluded.  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 -Last EGD/colonoscopy on 05/15/2015 was normal. - CT scan in October 2018 was negative for metastatic disease. 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  Margins negative.  2 hilar lymph nodes were negative. - No adjuvant chemotherapy was needed.   Social/family history: - Lives at home with his wife.  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  No chemical exposure.  Quit smoking in 2014 and smoked 1 pack/day for 44 years. - Paternal cousin has bone cancer.   PLAN:  Stage IIB adenocarcinoma of the pancreatic tail: - We have reviewed findings on the PET CT scan which showed pancreatic tail mass and gastrohepatic ligament hypermetabolic activity. - Nonspecific lymphadenopathy with mild hypermetabolism in the mediastinum and left hilar lymph nodes could be reactive.  We will follow it on subsequent scans. - Recommend neoadjuvant chemotherapy perioperatively for 6 months. - I have recommended germline mutation testing. - I have also recommended NGS testing. - We talked about FOLFIRINOX chemotherapy for 2 to 3 months followed by repeat  imaging. - We will get in touch with Dr. Dayton Scrape to confirm the plan. - We talked about the schedule and side effects of chemotherapy in detail.  He already has port.  We have given information about these agents. - We will likely start his treatment next week. - We will also order splenectomy vaccines in preparation for surgery.  2.  Abdominal pain: - He reports left upper quadrant pain all the time, 6 out of 10.  Was on lying in the left lateral position.  No relation to eating. - He is not requiring any narcotics.  3. Iron deficiency: - Continue iron tablet daily.  Hemoglobin is 12.  Last ferritin was 79 and percent saturation  20 on 09/08/2021.  He will require parenteral iron therapy with chemotherapy.  4. Peripheral neuropathy: - He has occasional tingling in the feet.  Continue gabapentin at nighttime.  I will dose reduce oxaliplatin.  5.  Weight loss: - He has lost about 15 pounds since March due to decreased appetite and decreased size in portions.  He is eating 1 meal per day and occasionally 2 meals per day.  We will closely monitor.  6.  Chronic diarrhea: - He has chronic diarrhea since his previous surgery.  This is managed with Imodium and Lomotil.  He gets diarrhea after each meal.  I will dose reduce Irinotecan during first cycle.    All questions were answered. The patient knows to call the clinic with any problems, questions or concerns.   Derek Jack, MD, 10/07/21 4:37 PM  [place] [phone]   I, Thana Ates, am acting as a scribe for Dr. Derek Jack.  I, Derek Jack MD, have reviewed the above documentation for accuracy and completeness, and I agree with the above.

## 2021-10-07 NOTE — Progress Notes (Signed)
Patient on plan of care prior to pathways. 

## 2021-10-12 ENCOUNTER — Other Ambulatory Visit: Payer: Self-pay

## 2021-10-13 ENCOUNTER — Other Ambulatory Visit (HOSPITAL_COMMUNITY): Payer: Self-pay | Admitting: *Deleted

## 2021-10-13 ENCOUNTER — Other Ambulatory Visit (HOSPITAL_COMMUNITY): Payer: Self-pay | Admitting: Physician Assistant

## 2021-10-13 ENCOUNTER — Inpatient Hospital Stay (HOSPITAL_COMMUNITY): Payer: Medicare Other

## 2021-10-13 VITALS — BP 124/68 | HR 72 | Temp 96.2°F | Resp 18 | Ht 67.72 in | Wt 123.2 lb

## 2021-10-13 DIAGNOSIS — C252 Malignant neoplasm of tail of pancreas: Secondary | ICD-10-CM | POA: Diagnosis not present

## 2021-10-13 DIAGNOSIS — C189 Malignant neoplasm of colon, unspecified: Secondary | ICD-10-CM

## 2021-10-13 DIAGNOSIS — D508 Other iron deficiency anemias: Secondary | ICD-10-CM

## 2021-10-13 LAB — CBC WITH DIFFERENTIAL/PLATELET
Abs Immature Granulocytes: 0.03 10*3/uL (ref 0.00–0.07)
Basophils Absolute: 0 10*3/uL (ref 0.0–0.1)
Basophils Relative: 1 %
Eosinophils Absolute: 0.1 10*3/uL (ref 0.0–0.5)
Eosinophils Relative: 2 %
HCT: 38.6 % — ABNORMAL LOW (ref 39.0–52.0)
Hemoglobin: 12.3 g/dL — ABNORMAL LOW (ref 13.0–17.0)
Immature Granulocytes: 0 %
Lymphocytes Relative: 28 %
Lymphs Abs: 2.3 10*3/uL (ref 0.7–4.0)
MCH: 30.2 pg (ref 26.0–34.0)
MCHC: 31.9 g/dL (ref 30.0–36.0)
MCV: 94.8 fL (ref 80.0–100.0)
Monocytes Absolute: 0.8 10*3/uL (ref 0.1–1.0)
Monocytes Relative: 10 %
Neutro Abs: 5 10*3/uL (ref 1.7–7.7)
Neutrophils Relative %: 59 %
Platelets: 142 10*3/uL — ABNORMAL LOW (ref 150–400)
RBC: 4.07 MIL/uL — ABNORMAL LOW (ref 4.22–5.81)
RDW: 14 % (ref 11.5–15.5)
WBC: 8.2 10*3/uL (ref 4.0–10.5)
nRBC: 0 % (ref 0.0–0.2)

## 2021-10-13 LAB — IRON AND TIBC
Iron: 60 ug/dL (ref 45–182)
Saturation Ratios: 26 % (ref 17.9–39.5)
TIBC: 230 ug/dL — ABNORMAL LOW (ref 250–450)
UIBC: 170 ug/dL

## 2021-10-13 LAB — COMPREHENSIVE METABOLIC PANEL
ALT: 10 U/L (ref 0–44)
AST: 14 U/L — ABNORMAL LOW (ref 15–41)
Albumin: 2.8 g/dL — ABNORMAL LOW (ref 3.5–5.0)
Alkaline Phosphatase: 74 U/L (ref 38–126)
Anion gap: 5 (ref 5–15)
BUN: 7 mg/dL — ABNORMAL LOW (ref 8–23)
CO2: 21 mmol/L — ABNORMAL LOW (ref 22–32)
Calcium: 8 mg/dL — ABNORMAL LOW (ref 8.9–10.3)
Chloride: 113 mmol/L — ABNORMAL HIGH (ref 98–111)
Creatinine, Ser: 1.56 mg/dL — ABNORMAL HIGH (ref 0.61–1.24)
GFR, Estimated: 47 mL/min — ABNORMAL LOW (ref >60)
Glucose, Bld: 137 mg/dL — ABNORMAL HIGH (ref 70–99)
Potassium: 4.1 mmol/L (ref 3.5–5.1)
Sodium: 139 mmol/L (ref 135–145)
Total Bilirubin: 0.4 mg/dL (ref 0.3–1.2)
Total Protein: 5.7 g/dL — ABNORMAL LOW (ref 6.5–8.1)

## 2021-10-13 LAB — FERRITIN: Ferritin: 92 ng/mL (ref 24–336)

## 2021-10-13 MED ORDER — SODIUM CHLORIDE 0.9 % IV SOLN
2400.0000 mg/m2 | INTRAVENOUS | Status: DC
  Administered 2021-10-13: 3950 mg via INTRAVENOUS
  Filled 2021-10-13: qty 79

## 2021-10-13 MED ORDER — SODIUM CHLORIDE 0.9 % IV SOLN
150.0000 mg | Freq: Once | INTRAVENOUS | Status: AC
  Administered 2021-10-13: 150 mg via INTRAVENOUS
  Filled 2021-10-13: qty 150

## 2021-10-13 MED ORDER — PALONOSETRON HCL INJECTION 0.25 MG/5ML
0.2500 mg | Freq: Once | INTRAVENOUS | Status: AC
  Administered 2021-10-13: 0.25 mg via INTRAVENOUS
  Filled 2021-10-13: qty 5

## 2021-10-13 MED ORDER — SODIUM CHLORIDE 0.9 % IV SOLN
10.0000 mg | Freq: Once | INTRAVENOUS | Status: AC
  Administered 2021-10-13: 10 mg via INTRAVENOUS
  Filled 2021-10-13: qty 10

## 2021-10-13 MED ORDER — PROCHLORPERAZINE MALEATE 10 MG PO TABS: 10.0000 mg | ORAL_TABLET | Freq: Four times a day (QID) | ORAL | 1 refills | Status: DC | PRN

## 2021-10-13 MED ORDER — ATROPINE SULFATE 1 MG/ML IV SOLN: 0.5000 mg | Freq: Once | INTRAVENOUS | Status: DC | PRN

## 2021-10-13 MED ORDER — SODIUM CHLORIDE 0.9 % IV SOLN
400.0000 mg/m2 | Freq: Once | INTRAVENOUS | Status: AC
  Administered 2021-10-13: 656 mg via INTRAVENOUS
  Filled 2021-10-13: qty 32.8

## 2021-10-13 MED ORDER — LIDOCAINE-PRILOCAINE 2.5-2.5 % EX CREA: TOPICAL_CREAM | CUTANEOUS | 3 refills | Status: DC

## 2021-10-13 MED ORDER — OXALIPLATIN CHEMO INJECTION 100 MG/20ML
62.0000 mg/m2 | Freq: Once | INTRAVENOUS | Status: AC
  Administered 2021-10-13: 100 mg via INTRAVENOUS
  Filled 2021-10-13: qty 20

## 2021-10-13 MED ORDER — SODIUM CHLORIDE 0.9 % IV SOLN
125.0000 mg/m2 | Freq: Once | INTRAVENOUS | Status: AC
  Administered 2021-10-13: 200 mg via INTRAVENOUS
  Filled 2021-10-13: qty 10

## 2021-10-13 MED ORDER — DEXTROSE 5 % IV SOLN: Freq: Once | INTRAVENOUS | Status: AC

## 2021-10-13 NOTE — Patient Instructions (Signed)
[facility]  Discharge Instructions: Thank you for choosing [facility] to provide your oncology and hematology care.  If you have a lab appointment with the [facility], please come in thru the Main Entrance and check in at the main information desk.  Wear comfortable clothing and clothing appropriate for easy access to any Portacath or PICC line.   We strive to give you quality time with your provider. You may need to reschedule your appointment if you arrive late (15 or more minutes).  Arriving late affects you and other patients whose appointments are after yours.  Also, if you miss three or more appointments without notifying the office, you may be dismissed from the clinic at the provider's discretion.      For prescription refill requests, have your pharmacy contact our office and allow 72 hours for refills to be completed.    Today you received the following chemotherapy and/or immunotherapy agents oxaliplatin, irinotecan, leucovorin, 5FU.       To help prevent nausea and vomiting after your treatment, we encourage you to take your nausea medication as directed.  BELOW ARE SYMPTOMS THAT SHOULD BE REPORTED IMMEDIATELY: *FEVER GREATER THAN 100.4 F (38 C) OR HIGHER *CHILLS OR SWEATING *NAUSEA AND VOMITING THAT IS NOT CONTROLLED WITH YOUR NAUSEA MEDICATION *UNUSUAL SHORTNESS OF BREATH *UNUSUAL BRUISING OR BLEEDING *URINARY PROBLEMS (pain or burning when urinating, or frequent urination) *BOWEL PROBLEMS (unusual diarrhea, constipation, pain near the anus) TENDERNESS IN MOUTH AND THROAT WITH OR WITHOUT PRESENCE OF ULCERS (sore throat, sores in mouth, or a toothache) UNUSUAL RASH, SWELLING OR PAIN  UNUSUAL VAGINAL DISCHARGE OR ITCHING   Items with * indicate a potential emergency and should be followed up as soon as possible or go to the Emergency Department if any problems should occur.  Please show the CHEMOTHERAPY ALERT CARD or IMMUNOTHERAPY ALERT CARD at  check-in to the Emergency Department and triage nurse.  Should you have questions after your visit or need to cancel or reschedule your appointment, please contact [facility] [phone]  and follow the prompts.  Office hours are 8:00 a.m. to 4:30 p.m. Monday - Friday. Please note that voicemails left after 4:00 p.m. may not be returned until the following business day.  We are closed weekends and major holidays. You have access to a nurse at all times for urgent questions. Please call the main number to the clinic [phone] and follow the prompts.  For any non-urgent questions, you may also contact your provider using MyChart. We now offer e-Visits for anyone 26 and older to request care online for non-urgent symptoms. For details visit [email or web address].   Also download the MyChart app! Go to the app store, search "MyChart", open the app, select [facility], and log in with your MyChart username and password.  Masks are optional in the cancer centers. If you would like for your care team to wear a mask while they are taking care of you, please let them know. For doctor visits, patients may have with them one support person who is at least 71 years old. At this time, visitors are not allowed in the infusion area.

## 2021-10-13 NOTE — Progress Notes (Signed)
Labs reviewed. Consent obtained. Patient stated he reviewed chemotherapy material . No questions at this time. Proceed as planned.   Treatment given per orders. Patient tolerated it well without problems. Vitals stable and discharged home from clinic ambulatory. Follow up as scheduled.

## 2021-10-14 LAB — CEA: CEA: 3.2 ng/mL (ref 0.0–4.7)

## 2021-10-15 ENCOUNTER — Inpatient Hospital Stay (HOSPITAL_COMMUNITY): Payer: Medicare Other

## 2021-10-15 VITALS — BP 121/73 | HR 58 | Temp 96.0°F | Resp 18

## 2021-10-15 DIAGNOSIS — C252 Malignant neoplasm of tail of pancreas: Secondary | ICD-10-CM

## 2021-10-15 MED ORDER — SODIUM CHLORIDE 0.9% FLUSH
10.0000 mL | INTRAVENOUS | Status: DC | PRN
  Administered 2021-10-15: 10 mL

## 2021-10-15 MED ORDER — HEPARIN SOD (PORK) LOCK FLUSH 100 UNIT/ML IV SOLN
500.0000 [IU] | Freq: Once | INTRAVENOUS | Status: AC | PRN
  Administered 2021-10-15: 500 [IU]

## 2021-10-15 NOTE — Progress Notes (Signed)
Patient presents today for a home infusion 5FU pump d/c.  Patient is in stable condition with only complaints of weakness due to chemo.  Vital signs are stable.    Patient tolerated d/c well with no complaints voiced.  Patient left ambulatory in stable condition.  Vital signs stable at discharge.  Follow up as scheduled.

## 2021-10-15 NOTE — Patient Instructions (Signed)
[facility]  Discharge Instructions: Thank you for choosing [facility] to provide your oncology and hematology care.  If you have a lab appointment with the Cancer Center, please come in thru the Main Entrance and check in at the main information desk.  Wear comfortable clothing and clothing appropriate for easy access to any Portacath or PICC line.   We strive to give you quality time with your provider. You may need to reschedule your appointment if you arrive late (15 or more minutes).  Arriving late affects you and other patients whose appointments are after yours.  Also, if you miss three or more appointments without notifying the office, you may be dismissed from the clinic at the provider's discretion.      For prescription refill requests, have your pharmacy contact our office and allow 72 hours for refills to be completed.         To help prevent nausea and vomiting after your treatment, we encourage you to take your nausea medication as directed.  BELOW ARE SYMPTOMS THAT SHOULD BE REPORTED IMMEDIATELY: *FEVER GREATER THAN 100.4 F (38 C) OR HIGHER *CHILLS OR SWEATING *NAUSEA AND VOMITING THAT IS NOT CONTROLLED WITH YOUR NAUSEA MEDICATION *UNUSUAL SHORTNESS OF BREATH *UNUSUAL BRUISING OR BLEEDING *URINARY PROBLEMS (pain or burning when urinating, or frequent urination) *BOWEL PROBLEMS (unusual diarrhea, constipation, pain near the anus) TENDERNESS IN MOUTH AND THROAT WITH OR WITHOUT PRESENCE OF ULCERS (sore throat, sores in mouth, or a toothache) UNUSUAL RASH, SWELLING OR PAIN  UNUSUAL VAGINAL DISCHARGE OR ITCHING   Items with * indicate a potential emergency and should be followed up as soon as possible or go to the Emergency Department if any problems should occur.  Please show the CHEMOTHERAPY ALERT CARD or IMMUNOTHERAPY ALERT CARD at check-in to the Emergency Department and triage nurse.  Should you have questions after your visit or need to  cancel or reschedule your appointment, please contact [facility] [phone]  and follow the prompts.  Office hours are 8:00 a.m. to 4:30 p.m. Monday - Friday. Please note that voicemails left after 4:00 p.m. may not be returned until the following business day.  We are closed weekends and major holidays. You have access to a nurse at all times for urgent questions. Please call the main number to the clinic [phone] and follow the prompts.  For any non-urgent questions, you may also contact your provider using MyChart. We now offer e-Visits for anyone 18 and older to request care online for non-urgent symptoms. For details visit [email or web address].   Also download the MyChart app! Go to the app store, search "MyChart", open the app, select [facility], and log in with your MyChart username and password.  Masks are optional in the cancer centers. If you would like for your care team to wear a mask while they are taking care of you, please let them know. For doctor visits, patients may have with them one support person who is at least 71 years old. At this time, visitors are not allowed in the infusion area.  

## 2021-10-16 ENCOUNTER — Telehealth (HOSPITAL_COMMUNITY): Payer: Self-pay

## 2021-10-16 NOTE — Telephone Encounter (Signed)
24 hour follow up- patient is doing good, no reported issues or concerns.

## 2021-10-19 ENCOUNTER — Other Ambulatory Visit (HOSPITAL_COMMUNITY): Payer: Self-pay

## 2021-10-19 ENCOUNTER — Other Ambulatory Visit (HOSPITAL_COMMUNITY): Payer: Self-pay | Admitting: Hematology

## 2021-10-19 MED ORDER — HYDROCODONE-ACETAMINOPHEN 5-325 MG PO TABS: 1.0000 | ORAL_TABLET | Freq: Three times a day (TID) | ORAL | 0 refills | Status: DC | PRN

## 2021-10-19 MED ORDER — APIXABAN 5 MG PO TABS: 5.0000 mg | ORAL_TABLET | Freq: Two times a day (BID) | ORAL | 6 refills | Status: DC

## 2021-10-19 NOTE — Telephone Encounter (Signed)
Call received from patient requesting that Lovenox be changed to oral anticoagulation. Patient also reports ongoing abdominal pain, requesting something for pain management. Order received to transition Lovenox to Eliquis 5mg  twice daily and Hydrocodone 5-325mg  every 8 hours PRN for pain. Patient aware. All questions addressed and answered.

## 2021-10-22 ENCOUNTER — Other Ambulatory Visit (HOSPITAL_COMMUNITY): Payer: Self-pay | Admitting: Hematology

## 2021-10-26 ENCOUNTER — Other Ambulatory Visit: Payer: Self-pay

## 2021-10-26 DIAGNOSIS — C189 Malignant neoplasm of colon, unspecified: Secondary | ICD-10-CM

## 2021-10-26 DIAGNOSIS — C252 Malignant neoplasm of tail of pancreas: Secondary | ICD-10-CM

## 2021-10-26 DIAGNOSIS — C259 Malignant neoplasm of pancreas, unspecified: Secondary | ICD-10-CM

## 2021-10-27 ENCOUNTER — Inpatient Hospital Stay: Payer: Medicare Other

## 2021-10-27 ENCOUNTER — Inpatient Hospital Stay: Payer: Medicare Other | Attending: Hematology | Admitting: Hematology

## 2021-10-27 VITALS — BP 114/67 | HR 73 | Temp 97.3°F | Resp 18 | Wt 123.0 lb

## 2021-10-27 DIAGNOSIS — Z5111 Encounter for antineoplastic chemotherapy: Secondary | ICD-10-CM | POA: Insufficient documentation

## 2021-10-27 DIAGNOSIS — Z79899 Other long term (current) drug therapy: Secondary | ICD-10-CM | POA: Diagnosis not present

## 2021-10-27 DIAGNOSIS — C252 Malignant neoplasm of tail of pancreas: Secondary | ICD-10-CM | POA: Insufficient documentation

## 2021-10-27 DIAGNOSIS — Z5189 Encounter for other specified aftercare: Secondary | ICD-10-CM | POA: Diagnosis not present

## 2021-10-27 DIAGNOSIS — E611 Iron deficiency: Secondary | ICD-10-CM | POA: Insufficient documentation

## 2021-10-27 DIAGNOSIS — C259 Malignant neoplasm of pancreas, unspecified: Secondary | ICD-10-CM

## 2021-10-27 DIAGNOSIS — C189 Malignant neoplasm of colon, unspecified: Secondary | ICD-10-CM

## 2021-10-27 LAB — COMPREHENSIVE METABOLIC PANEL
ALT: 10 U/L (ref 0–44)
AST: 11 U/L — ABNORMAL LOW (ref 15–41)
Albumin: 2.4 g/dL — ABNORMAL LOW (ref 3.5–5.0)
Alkaline Phosphatase: 65 U/L (ref 38–126)
Anion gap: 4 — ABNORMAL LOW (ref 5–15)
BUN: 12 mg/dL (ref 8–23)
CO2: 21 mmol/L — ABNORMAL LOW (ref 22–32)
Calcium: 7.9 mg/dL — ABNORMAL LOW (ref 8.9–10.3)
Chloride: 108 mmol/L (ref 98–111)
Creatinine, Ser: 1.48 mg/dL — ABNORMAL HIGH (ref 0.61–1.24)
GFR, Estimated: 51 mL/min — ABNORMAL LOW (ref >60)
Glucose, Bld: 124 mg/dL — ABNORMAL HIGH (ref 70–99)
Potassium: 4 mmol/L (ref 3.5–5.1)
Sodium: 133 mmol/L — ABNORMAL LOW (ref 135–145)
Total Bilirubin: 0.4 mg/dL (ref 0.3–1.2)
Total Protein: 5.6 g/dL — ABNORMAL LOW (ref 6.5–8.1)

## 2021-10-27 LAB — CBC WITH DIFFERENTIAL/PLATELET
Abs Immature Granulocytes: 0.01 10*3/uL (ref 0.00–0.07)
Basophils Absolute: 0 10*3/uL (ref 0.0–0.1)
Basophils Relative: 1 %
Eosinophils Absolute: 0.1 10*3/uL (ref 0.0–0.5)
Eosinophils Relative: 4 %
HCT: 33.3 % — ABNORMAL LOW (ref 39.0–52.0)
Hemoglobin: 11 g/dL — ABNORMAL LOW (ref 13.0–17.0)
Immature Granulocytes: 0 %
Lymphocytes Relative: 33 %
Lymphs Abs: 1.2 10*3/uL (ref 0.7–4.0)
MCH: 29.9 pg (ref 26.0–34.0)
MCHC: 33 g/dL (ref 30.0–36.0)
MCV: 90.5 fL (ref 80.0–100.0)
Monocytes Absolute: 1.1 10*3/uL — ABNORMAL HIGH (ref 0.1–1.0)
Monocytes Relative: 31 %
Neutro Abs: 1.1 10*3/uL — ABNORMAL LOW (ref 1.7–7.7)
Neutrophils Relative %: 31 %
Platelets: 149 10*3/uL — ABNORMAL LOW (ref 150–400)
RBC: 3.68 MIL/uL — ABNORMAL LOW (ref 4.22–5.81)
RDW: 13.3 % (ref 11.5–15.5)
WBC: 3.6 10*3/uL — ABNORMAL LOW (ref 4.0–10.5)
nRBC: 0 % (ref 0.0–0.2)

## 2021-10-27 LAB — MAGNESIUM: Magnesium: 1.5 mg/dL — ABNORMAL LOW (ref 1.7–2.4)

## 2021-10-27 MED ORDER — MAGNESIUM SULFATE 2 GM/50ML IV SOLN
2.0000 g | Freq: Once | INTRAVENOUS | Status: AC
  Administered 2021-10-27: 2 g via INTRAVENOUS
  Filled 2021-10-27: qty 50

## 2021-10-27 MED ORDER — SODIUM CHLORIDE 0.9 % IV SOLN
2400.0000 mg/m2 | INTRAVENOUS | Status: DC
  Administered 2021-10-27: 3950 mg via INTRAVENOUS
  Filled 2021-10-27: qty 79

## 2021-10-27 MED ORDER — PALONOSETRON HCL INJECTION 0.25 MG/5ML
0.2500 mg | Freq: Once | INTRAVENOUS | Status: AC
  Administered 2021-10-27: 0.25 mg via INTRAVENOUS
  Filled 2021-10-27: qty 5

## 2021-10-27 MED ORDER — MAGNESIUM OXIDE -MG SUPPLEMENT 400 (240 MG) MG PO TABS: 400.0000 mg | ORAL_TABLET | Freq: Two times a day (BID) | ORAL | 2 refills | Status: DC

## 2021-10-27 MED ORDER — OXALIPLATIN CHEMO INJECTION 100 MG/20ML
62.0000 mg/m2 | Freq: Once | INTRAVENOUS | Status: AC
  Administered 2021-10-27: 100 mg via INTRAVENOUS
  Filled 2021-10-27: qty 20

## 2021-10-27 MED ORDER — DEXTROSE 5 % IV SOLN: Freq: Once | INTRAVENOUS | Status: AC

## 2021-10-27 MED ORDER — SODIUM CHLORIDE 0.9 % IV SOLN
400.0000 mg/m2 | Freq: Once | INTRAVENOUS | Status: AC
  Administered 2021-10-27: 656 mg via INTRAVENOUS
  Filled 2021-10-27: qty 32.8

## 2021-10-27 MED ORDER — SODIUM CHLORIDE 0.9 % IV SOLN
10.0000 mg | Freq: Once | INTRAVENOUS | Status: AC
  Administered 2021-10-27: 10 mg via INTRAVENOUS
  Filled 2021-10-27: qty 10

## 2021-10-27 MED ORDER — SODIUM CHLORIDE 0.9 % IV SOLN
150.0000 mg | Freq: Once | INTRAVENOUS | Status: AC
  Administered 2021-10-27: 150 mg via INTRAVENOUS
  Filled 2021-10-27: qty 150

## 2021-10-27 MED ORDER — SODIUM CHLORIDE 0.9 % IV SOLN
125.0000 mg/m2 | Freq: Once | INTRAVENOUS | Status: AC
  Administered 2021-10-27: 200 mg via INTRAVENOUS
  Filled 2021-10-27: qty 10

## 2021-10-27 NOTE — Progress Notes (Signed)
Received order to add Udenyca 6 mg to pump DC day.  Orders added.  T.O. Dr Rhys Martini, PharmD

## 2021-10-27 NOTE — Progress Notes (Deleted)
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 71 y.o. 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   Stage 3   Anemia    Anxiety    Arthritis    Barrett's esophagus    Blood transfusion    Bowel obstruction ([place]) 05/13/2012   Recurrent   Bronchitis    Chest pain at rest    Chronic abdominal pain    Erosive esophagitis    ETOH abuse    quit 03/2010   GERD (gastroesophageal reflux disease)    Hx of Clostridium difficile infection 01/2012   Hypertension    Ileus ([place])    Iron deficiency anemia 03/23/2016   Lung cancer ([place])    Obstruction of bowel ([place]) 03/03/2014   Osteoporosis    Personal history of PE (pulmonary embolism) 10/01/2010   Pneumonia    Pulmonary embolism ([place]) 02/2010   Recurrent upper respiratory infection (URI)    Renal disorder    S/P endoscopy 09/28/2010   erosive reflux esophagitis, Billroth I anatomy   S/P partial gastrectomy 1980s   Seizures ([place])    was on meds for 6 months, Unknown etiology, last seizure was 2017.   Shortness of breath    TIA (transient ischemic attack) 12/2009   Vitamin B12 deficiency     SURGICAL HISTORY: Past Surgical History:  Procedure Laterality Date   ABDOMINAL ADHESION SURGERY  03/04/15   @ [facility]   ABDOMINAL EXPLORATION SURGERY     abdominal sugery     for bowel obstruction x 8, all in 1980s,  except for one in 07/2010   APPENDECTOMY  1980s   Billroth 1 hemigastrectomy  1980s   per patient for benign duodenal tumor   CARDIAC CATHETERIZATION  07/17/2012   CHOLECYSTECTOMY  1980s   COLON SURGERY  May 2012   left hemicolectomy, colon cancer found at time of surgery for bowel obstruction   COLON SURGERY  10/12/2018   At [facility]: Resection of right colon and terminal ileum with creation of ileorectal anastomosis for large bowel obstruction.  Cecum and ascending colon noted to be attached to the rectum.   COLONOSCOPY  03/18/2011   anastomosis at 35cm. Several adenomatous polyps removed. Sigmoid diverticulosis. Next TCS 02/2013   COLONOSCOPY N/A 07/24/2012   XFG:HWEXHB post segmental resection with normal-appearing colonic anastomosis aside from an adjacent polyp-removed as described above. Rectal polyp-removed as described above. CT findings appear to have been artifactual. tubular adenomas/prolapsed type polyp.   COLONOSCOPY N/A 05/15/2015   Procedure: COLONOSCOPY;  Surgeon: [name], MD;  [facility];  Service: Endoscopy;  Laterality: N/A;   COLONOSCOPY  09/26/2018   at [facility]: prep not adequate for colon cancer surveillance.  Prior end-to-end colonic anastomosis in the rectosigmoid region.  This was patent and characterized by healthy-appearing mucosa.  Anastomosis was traversed.  Normal terminal ileum.   COLONOSCOPY WITH PROPOFOL N/A 11/04/2016   Dr. [name], status post subtotal colectomy with  normal-appearing residual lower GI tract.  Next colonoscopy in 5 years.   ESOPHAGOGASTRODUODENOSCOPY  09/28/2010   ESOPHAGOGASTRODUODENOSCOPY  12/01/2010   Cervical web status post dilation, erosive esophagitis, B1 hemigastrectomy, inflamed anastomosis   ESOPHAGOGASTRODUODENOSCOPY  04/16/2011   excoriation at [facility] c/w trauma/M-W tear, friable gastric anastomosis, dilation efferent limb   ESOPHAGOGASTRODUODENOSCOPY N/A 06/03/2014   Dr.[name]- cervcal esopphageal web s/p dilation. abnormal  distal esophagus bx= barretts esophagus   ESOPHAGOGASTRODUODENOSCOPY N/A 05/15/2015   Procedure: ESOPHAGOGASTRODUODENOSCOPY (EGD);  Surgeon: [name], MD;  [facility];  Service: Endoscopy;  Laterality: N/A;  230   ESOPHAGOGASTRODUODENOSCOPY (EGD) WITH ESOPHAGEAL DILATION  02/25/2012   NOB:[id number] esophageal web-s/p dilation anddisruption as described above. Status post prior gastric with Billroth I configuration. Abnormal gastric mucosa at the anastomosis. Gastric biopsy showed mild chronic inflammation but no H. pylori    ESOPHAGOGASTRODUODENOSCOPY (EGD) WITH PROPOFOL N/A 11/04/2016   Dr. [name]: Reflux esophagitis, small hiatal hernia status post hemigastrectomy.  Single patent efferent small bowel limb appeared normal, 2 x 2 centimeter tongue of salmon epithelium again seen, esophageal dilation.  Biopsies consistent with reflux changes, not Barrett's however this was confirmed on prior EGDs.  Offer 3-year follow-up EGD August 2021.   ESOPHAGOGASTRODUODENOSCOPY (EGD) WITH PROPOFOL N/A 03/03/2020   Procedure: ESOPHAGOGASTRODUODENOSCOPY (EGD) WITH PROPOFOL;  Surgeon: [name], MD;  [facility];  Service: Endoscopy;  Laterality: N/A;  3:00pm   HERNIA REPAIR     right inguinal   MALONEY DILATION N/A 06/03/2014   Procedure: [name] DILATION;  Surgeon: [name], MD;  [facility];  Service: Endoscopy;  Laterality: N/A;   MALONEY DILATION N/A 05/15/2015   Procedure: [name] DILATION;  Surgeon: [name], MD;  [facility];  Service: Endoscopy;  Laterality: N/A;   MALONEY DILATION N/A 11/04/2016   Procedure: [name] DILATION;  Surgeon: [name], MD;  [facility];  Service: Endoscopy;  Laterality: N/A;   MALONEY DILATION N/A 03/03/2020   Procedure: [name] DILATION;  Surgeon: [name], MD;  [facility];  Service: Endoscopy;  Laterality: N/A;   PORTACATH PLACEMENT     SAVORY DILATION N/A 06/03/2014    Procedure: SAVORY DILATION;  Surgeon: [name], MD;  [facility];  Service: Endoscopy;  Laterality: N/A;    SOCIAL HISTORY: Social History   Socioeconomic History   Marital status: Married    Spouse name: Not on file   Number of children: 3   Years of education: Not on file   Highest education level: Not on file  Occupational History    Employer: [employer]  Tobacco Use   Smoking status: Former    Packs/day: 0.50    Years: 40.00    Total pack years: 20.00    Types: Cigarettes    Quit date: 12/20/2012    Years since quitting: 8.8   Smokeless tobacco: Never  Vaping Use   Vaping Use: Never used  Substance and Sexual Activity   Alcohol use: No   Drug use: No   Sexual activity: Never  Other Topics Concern   Not on file  Social History Narrative   Not on file   Social Determinants of Health   Financial Resource Strain: Not on file  Food Insecurity: Not on file  Transportation Needs: Not on file  Physical Activity: Not on file  Stress: Not on file  Social Connections: Not on file  Intimate Partner Violence: Not on  file    FAMILY HISTORY: Family History  Problem Relation Age of Onset   Hypertension Mother    Arthritis Mother    Pneumonia Mother    Hypertension Father    Heart attack Father    Colon cancer Neg Hx     ALLERGIES:  has No Known Allergies.  MEDICATIONS:  Current Outpatient Medications  Medication Sig Dispense Refill   ammonium lactate (AMLACTIN) 12 % cream Apply 1 g topically See admin instructions. APPLY CREAM TO DRY SKIN ON LEGS ARMS HANDS AND BACK AREA ONCE TO TWICE DAILY (AVOID FACE GROIN UNDERARMS)  3   apixaban (ELIQUIS) 5 MG TABS tablet Take 1 tablet (5 mg total) by mouth 2 (two) times daily. 60 tablet 6   atorvastatin (LIPITOR) 20 MG tablet Take by mouth.     Bacillus Coagulans-Inulin (PROBIOTIC) 1-250 BILLION-MG CAPS Take by mouth.     baclofen (LIORESAL) 10 MG tablet as needed.     carvedilol (COREG) 25 MG tablet  Take 0.5 tablets (12.5 mg total) by mouth 2 (two) times daily with a meal. (Patient taking differently: Take 25 mg by mouth 2 (two) times daily with a meal. Takes 25 mg 2 times daily.) 15 tablet 2   Cholecalciferol (VITAMIN D) 2000 units CAPS Take 2,000 Units by mouth daily.     Cholecalciferol (VITAMIN D3) 10 MCG (400 UNIT) tablet Take by mouth.     cyanocobalamin 100 MCG tablet Take by mouth.     diclofenac Sodium (VOLTAREN) 1 % GEL Apply topically.     diphenoxylate-atropine (LOMOTIL) 2.5-0.025 MG tablet TAKE 1 TABLET BY MOUTH 4 TIMES A DAY 120 tablet 1   enoxaparin (LOVENOX) 60 MG/0.6ML injection Inject 60 mg into the skin daily.     ferrous sulfate 325 (65 FE) MG EC tablet Take by mouth.     gabapentin (NEURONTIN) 100 MG capsule TAKE 1 CAPSULE BY MOUTH EVERY DAY AT BEDTIME 30 capsule 6   HYDROcodone-acetaminophen (NORCO/VICODIN) 5-325 MG tablet Take 1 tablet by mouth every 8 (eight) hours as needed for moderate pain. 45 tablet 0   lidocaine-prilocaine (EMLA) cream Apply to port site 1 hour prior to port access 30 g 3   loperamide (IMODIUM A-D) 2 MG tablet Take 2 mg by mouth in the morning and at bedtime.     mirtazapine (REMERON) 30 MG tablet TAKE 1 TABLET BY MOUTH AT BEDTIME 30 tablet 0   Multiple Vitamin (MULTIVITAMIN WITH MINERALS) TABS tablet Take 1 tablet by mouth daily.     ondansetron (ZOFRAN-ODT) 4 MG disintegrating tablet TAKE 1 TABLET BY MOUTH EVERY 4 HOURS AS NEEDED FOR NAUSEA AND VOMITING 30 tablet 1   Pancrelipase, Lip-Prot-Amyl, (CREON PO) Take 3 capsules by mouth with meals, and take 2 capsules with snacks     pantoprazole (PROTONIX) 40 MG tablet Take 1 tablet (40 mg total) by mouth 2 (two) times daily before a meal. 180 tablet 3   PREVALITE 4 g packet Take 1 packet (4 g total) by mouth daily. DO NOT TAKE WITHIN 2 HOURS OF OTHER MEDICATIONS 90 packet 3   prochlorperazine (COMPAZINE) 10 MG tablet Take by mouth.     prochlorperazine (COMPAZINE) 10 MG tablet Take 1 tablet (10 mg  total) by mouth every 6 (six) hours as needed (Nausea or vomiting). 60 tablet 1   sucralfate (CARAFATE) 1 GM/10ML suspension TAKE 10 MLS (1 G TOTAL) BY MOUTH 4 (FOUR) TIMES DAILY AS NEEDED. FOR BREAKTHROUGH HEARTBURN 420 mL 5   traMADol (ULTRAM) 50  MG tablet Take 1 tablet by mouth every 6 (six) hours as needed.     No current facility-administered medications for this visit.   Facility-Administered Medications Ordered in Other Visits  Medication Dose Route Frequency Provider Last Rate Last Admin   heparin lock flush 100 unit/mL  500 Units Intravenous Once Kefalas, Thomas S, PA-C       sodium chloride flush (NS) 0.9 % injection 10 mL  10 mL Intravenous PRN Holley Bouche, NP   10 mL at 11/05/16 1136    REVIEW OF SYSTEMS:   Review of Systems  Constitutional:  Positive for fatigue and unexpected weight change (-15 lbs). Negative for appetite change.  Gastrointestinal:  Positive for abdominal pain (upper L), diarrhea and nausea.  Musculoskeletal:  Positive for back pain.  Neurological:  Positive for numbness (feet).  All other systems reviewed and are negative.    PHYSICAL EXAMINATION: ECOG PERFORMANCE STATUS: 1 - Symptomatic but completely ambulatory  There were no vitals filed for this visit.  There were no vitals filed for this visit.  Physical Exam Vitals reviewed.  Constitutional:      Appearance: Normal appearance.  Cardiovascular:     Rate and Rhythm: Normal rate and regular rhythm.     Pulses: Normal pulses.     Heart sounds: Normal heart sounds.  Pulmonary:     Effort: Pulmonary effort is normal.     Breath sounds: Normal breath sounds.  Abdominal:     Palpations: Abdomen is soft. There is no hepatomegaly, splenomegaly or mass.     Tenderness: There is no abdominal tenderness.  Musculoskeletal:     Right lower leg: No edema.     Left lower leg: No edema.  Lymphadenopathy:     Upper Body:     Right upper body: No supraclavicular adenopathy.     Left upper  body: No supraclavicular adenopathy.     Lower Body: No right inguinal adenopathy. No left inguinal adenopathy.  Neurological:     General: No focal deficit present.     Mental Status: He is alert and oriented to person, place, and time.  Psychiatric:        Mood and Affect: Mood normal.        Behavior: Behavior normal.      LABORATORY DATA:  I have reviewed the data as listed    Latest Ref Rng & Units 10/13/2021    7:56 AM 10/01/2021    7:56 AM 09/08/2021   11:15 AM  CBC  WBC 4.0 - 10.5 K/uL 8.2  7.0  7.6   Hemoglobin 13.0 - 17.0 g/dL 12.3  12.0  12.5   Hematocrit 39.0 - 52.0 % 38.6  36.7  38.0   Platelets 150 - 400 K/uL 142  121  116       Latest Ref Rng & Units 10/13/2021    7:56 AM 10/01/2021    7:56 AM 09/08/2021   11:15 AM  CMP  Glucose 70 - 99 mg/dL 137  92  108   BUN 8 - 23 mg/dL 7  13  15    Creatinine 0.61 - 1.24 mg/dL 1.56  1.72  1.45   Sodium 135 - 145 mmol/L 139  135  137   Potassium 3.5 - 5.1 mmol/L 4.1  4.0  4.2   Chloride 98 - 111 mmol/L 113  105  113   CO2 22 - 32 mmol/L 21  25  18    Calcium 8.9 - 10.3 mg/dL 8.0  7.8  8.2   Total Protein 6.5 - 8.1 g/dL 5.7  5.8  5.9   Total Bilirubin 0.3 - 1.2 mg/dL 0.4  0.5  0.5   Alkaline Phos 38 - 126 U/L 74  72  65   AST 15 - 41 U/L 14  12  15    ALT 0 - 44 U/L 10  13  16      RADIOGRAPHIC STUDIES: I have personally reviewed the radiological images as listed and agreed with the findings in the report. NM PET Image Initial (PI) Skull Base To Thigh (F-18 FDG)  Result Date: 10/02/2021 CLINICAL DATA:  Initial treatment strategy for pancreatic carcinoma. EXAM: NUCLEAR MEDICINE PET SKULL BASE TO THIGH TECHNIQUE: 6.4 mCi F-18 FDG was injected intravenously. Full-ring PET imaging was performed from the skull base to thigh after the radiotracer. CT data was obtained and used for attenuation correction and anatomic localization. Fasting blood glucose: 101 mg/dl COMPARISON:  None Available. FINDINGS: Mediastinal blood-pool activity  (background): SUV max = 1.4 Liver activity (reference): SUV max = N/A NECK:  No hypermetabolic lymph nodes or masses. Incidental CT findings:  None. CHEST: 9 mm mediastinal lymph node in the AP window is hypermetabolic, with SUV max of 4.9. Borderline enlarged left hilar lymph nodes also show hypermetabolic activity with SUV max of 3.4. Previously seen subpleural nodule in the superior right lower lobe has nearly completely resolved since previous study and shows no significant FDG uptake. No other suspicious pulmonary nodules seen on CT images. Incidental CT findings: Stable postop changes in left lung. Mild centrilobular emphysema. ABDOMEN/PELVIS: Ill-defined mass is seen in the left upper quadrant in the region of the pancreatic tail, measuring 4.1 x 3.1 cm on image 110/3 this shows mild hypermetabolism, with SUV max of 4.7. No abnormal hypermetabolic activity within the liver, adrenal glands, or spleen. A soft tissue nodule is seen in the gastrohepatic ligament measuring 2.0 x 1.3 cm on image 22/3, which shows hypermetabolic activity SUV max of 4.0. This is suspicious for metastatic lymphadenopathy. No other hypermetabolic lymphadenopathy seen within the abdomen or pelvis. Incidental CT findings: Aortic atherosclerotic calcification incidentally noted. Benign-appearing right renal cysts shows absence of FDG uptake. SKELETON: No focal hypermetabolic bone lesions to suggest skeletal metastasis. Incidental CT findings:  None. IMPRESSION: Ill-defined 4 cm mass in the left upper quadrant in the region of the pancreatic tail, with mild hypermetabolism, consistent with known pancreatic carcinoma. 2 cm soft tissue nodule in the gastrohepatic ligament shows mild hypermetabolic activity, suspicious for metastatic lymphadenopathy. Mild hypermetabolic mediastinal and left hilar lymph nodes, which are nonspecific. These may be reactive in etiology, although metastatic disease cannot definitely be excluded. Recommend  continued follow-up by chest CT in 3 months. Previously seen subpleural nodule in the superior right lower lobe has nearly completely resolved shows no significant FDG uptake, consistent with resolving inflammatory etiology. Recommend continued attention on follow-up CT. Aortic Atherosclerosis (ICD10-I70.0) and Emphysema (ICD10-J43.9). Electronically Signed   By: Marlaine Hind M.D.   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  No lymphadenopathy. - EUS and FNA (09/21/2021): Oval mass in the pancreatic tail, hypoechoic, 20 mm x 17 mm.  Remainder of the pancreas shows decreased visualization of the pancreas. - Pathology: Invasive adenocarcinoma, ductal type, moderately to poorly differentiated.  Background desmoplastic stroma, blood and pancreatic tissue.  Morphologically distinct from prior lung adenocarcinoma.  TTF-1 and Napsin A were negative. - Patient evaluated by Dr. Dayton Scrape at [facility] for pancreatic resection. - CA 19-9 (10/02/2021): 6.47 - PET CT scan (10/02/2021): Ill-defined 4 cm mass in the left upper quadrant in the region of the pancreatic tail with mild hypermetabolism, SUV 4.7.  Soft tissue nodule in the gastrohepatic ligament measuring 2 x 1.3 cm, SUV 4.0.  Suspicious for metastatic lymphadenopathy.  9 mm mediastinal lymph node and AP window is hypermetabolic SUV 4.9.  Borderline enlarged left hilar lymph nodes show hypermetabolic activity SUV 3.4.  Previously seen subpleural nodule in the superior right lower lobe has nearly completely resolved.  Overall mild hypermetabolic mediastinal and left hilar lymph nodes which are nonspecific.  May be reactive in etiology although metastatic disease cannot definitely be excluded.  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 -Last EGD/colonoscopy on 05/15/2015 was normal. - CT scan in October 2018 was negative for metastatic disease. 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  Margins negative.  2 hilar lymph nodes were negative. - No adjuvant chemotherapy was needed.   Social/family history: - Lives at home with his wife.  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  No chemical exposure.  Quit smoking in 2014 and smoked 1 pack/day for 44 years. - Paternal cousin has bone cancer.   PLAN:  Stage IIB adenocarcinoma of the pancreatic tail: - We have reviewed findings on the PET CT scan which showed pancreatic tail mass and gastrohepatic ligament hypermetabolic activity. - Nonspecific lymphadenopathy with mild hypermetabolism in the mediastinum and left hilar lymph nodes could be reactive.  We will follow it on subsequent scans. - Recommend neoadjuvant chemotherapy perioperatively for 6 months. - I have recommended germline mutation testing. - I have also recommended NGS testing. - We talked about FOLFIRINOX chemotherapy for 2 to 3 months followed by repeat imaging. - We will get in touch with Dr. Dayton Scrape to confirm the plan. - We talked about the schedule and side effects of chemotherapy in detail.  He already has port.  We have given information about these agents. - We will likely start his treatment next week. - We will also order splenectomy vaccines in preparation for surgery.  2.  Abdominal pain: - He reports left upper quadrant pain all the time, 6 out of 10.  Was on lying in the left lateral position.  No relation to eating. - He is not requiring any narcotics.  3. Iron deficiency: - Continue iron tablet daily.   Hemoglobin is 12.  Last ferritin was 79 and percent saturation 20 on 09/08/2021.  He will require parenteral iron therapy with chemotherapy.  4. Peripheral neuropathy: - He has occasional tingling in the feet.  Continue gabapentin at nighttime.  I will dose reduce oxaliplatin.  5.  Weight loss: - He has lost about 15 pounds since March due to decreased appetite and decreased size in portions.  He is eating 1 meal per day and occasionally 2 meals per day.  We will closely monitor.  6.  Chronic diarrhea: - He has chronic diarrhea since his previous surgery.  This is managed with Imodium and Lomotil.  He gets diarrhea after each meal.  I will dose reduce Irinotecan during first cycle.    All questions were answered. The patient knows to call the clinic with any problems, questions or concerns.   Derek Jack, MD, 10/27/21 7:58 AM  [place] [phone]   I, Thana Ates, am acting as a scribe for Dr. Derek Jack.  I, Derek Jack MD, have reviewed the above documentation for accuracy and completeness, and I agree with the above.

## 2021-10-27 NOTE — Progress Notes (Signed)
Labs reviewed with MD. Madaline Brilliant to treat today per MD. ANC 1.1, MD aware.   Treatment given per orders. Patient tolerated it well without problems. Vitals stable and discharged home from clinic ambulatory. Follow up as scheduled.

## 2021-10-27 NOTE — Patient Instructions (Signed)
[place] at [facility] Discharge Instructions   You were seen and examined today by Dr. Delton Coombes.  He reviewed the results of your lab work. Your magnesium is low. We will give you magnesium in the clinic today. We also sent a prescription for magnesium to your pharmacy. Your white count is also low. We will plan to give you a white blood cell booster shot when you come to get your pump off.  We will proceed with your treatment today.  Return as scheduled.    Thank you for choosing [place] at [facility] to provide your oncology and hematology care.  To afford each patient quality time with our provider, please arrive at least 15 minutes before your scheduled appointment time.   If you have a lab appointment with the [facility] please come in thru the Main Entrance and check in at the main information desk.  You need to re-schedule your appointment should you arrive 10 or more minutes late.  We strive to give you quality time with our providers, and arriving late affects you and other patients whose appointments are after yours.  Also, if you no show three or more times for appointments you may be dismissed from the clinic at the providers discretion.     Again, thank you for choosing [facility].  Our hope is that these requests will decrease the amount of time that you wait before being seen by our physicians.       _____________________________________________________________  Should you have questions after your visit to [facility], please contact our office at [phone] and follow the prompts.  Our office hours are 8:00 a.m. and 4:30 p.m. Monday - Friday.  Please note that voicemails left after 4:00 p.m. may not be returned until the following business day.  We are closed weekends and major holidays.  You do have access to a nurse 24-7, just call the main number to the clinic [phone] and do not  press any options, hold on the line and a nurse will answer the phone.    For prescription refill requests, have your pharmacy contact our office and allow 72 hours.    Due to Covid, you will need to wear a mask upon entering the hospital. If you do not have a mask, a mask will be given to you at the Main Entrance upon arrival. For doctor visits, patients may have 1 support person age 42 or older with them. For treatment visits, patients can not have anyone with them due to social distancing guidelines and our immunocompromised population.

## 2021-10-27 NOTE — Patient Instructions (Signed)
[facility]  Discharge Instructions: Thank you for choosing [facility] to provide your oncology and hematology care.  If you have a lab appointment with the [facility], please come in thru the Main Entrance and check in at the main information desk.  Wear comfortable clothing and clothing appropriate for easy access to any Portacath or PICC line.   We strive to give you quality time with your provider. You may need to reschedule your appointment if you arrive late (15 or more minutes).  Arriving late affects you and other patients whose appointments are after yours.  Also, if you miss three or more appointments without notifying the office, you may be dismissed from the clinic at the provider's discretion.      For prescription refill requests, have your pharmacy contact our office and allow 72 hours for refills to be completed.    Today you received the following chemotherapy irrinotecan, oxaliplatin, 5FU, leucovorin   To help prevent nausea and vomiting after your treatment, we encourage you to take your nausea medication as directed.  BELOW ARE SYMPTOMS THAT SHOULD BE REPORTED IMMEDIATELY: *FEVER GREATER THAN 100.4 F (38 C) OR HIGHER *CHILLS OR SWEATING *NAUSEA AND VOMITING THAT IS NOT CONTROLLED WITH YOUR NAUSEA MEDICATION *UNUSUAL SHORTNESS OF BREATH *UNUSUAL BRUISING OR BLEEDING *URINARY PROBLEMS (pain or burning when urinating, or frequent urination) *BOWEL PROBLEMS (unusual diarrhea, constipation, pain near the anus) TENDERNESS IN MOUTH AND THROAT WITH OR WITHOUT PRESENCE OF ULCERS (sore throat, sores in mouth, or a toothache) UNUSUAL RASH, SWELLING OR PAIN  UNUSUAL VAGINAL DISCHARGE OR ITCHING   Items with * indicate a potential emergency and should be followed up as soon as possible or go to the Emergency Department if any problems should occur.  Please show the CHEMOTHERAPY ALERT CARD or IMMUNOTHERAPY ALERT CARD at check-in to the  Emergency Department and triage nurse.  Should you have questions after your visit or need to cancel or reschedule your appointment, please contact [facility] [phone]  and follow the prompts.  Office hours are 8:00 a.m. to 4:30 p.m. Monday - Friday. Please note that voicemails left after 4:00 p.m. may not be returned until the following business day.  We are closed weekends and major holidays. You have access to a nurse at all times for urgent questions. Please call the main number to the clinic [phone] and follow the prompts.  For any non-urgent questions, you may also contact your provider using MyChart. We now offer e-Visits for anyone 65 and older to request care online for non-urgent symptoms. For details visit [email or web address].   Also download the MyChart app! Go to the app store, search "MyChart", open the app, select [facility], and log in with your MyChart username and password.  Masks are optional in the cancer centers. If you would like for your care team to wear a mask while they are taking care of you, please let them know. For doctor visits, patients may have with them one support person who is at least 71 years old. At this time, visitors are not allowed in the infusion area.

## 2021-10-27 NOTE — Progress Notes (Signed)
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   CURRENT THERAPY: FOLFIRINOX every 2 weeks  BRIEF ONCOLOGIC HISTORY:  Oncology History  Pancreatic cancer ([facility])  10/07/2021 Initial Diagnosis   Pancreatic cancer ([facility])   10/13/2021 -  Chemotherapy   Patient is on Treatment Plan : PANCREAS Modified FOLFIRINOX q14d x 4 cycles       CANCER STAGING:  Cancer Staging  Adenocarcinoma of colon with mucinous features Staging form: Colon and Rectum, AJCC 7th Edition - Clinical: Stage IIIB (T3, N1, M0) - Signed by Baird Cancer, PA on 09/22/2010  Pancreatic cancer [facility]) Staging form: Exocrine Pancreas, AJCC 8th Edition - Clinical stage from 10/07/2021: Stage IIB (cT1, cN1, cM0) - Unsigned    INTERVAL HISTORY:  [name] 71 y.o. male seen for follow-up of pancreatic cancer.  After cycle 1, 2 weeks ago, he felt tired mostly during the first week.  Occasional numbness in the toes has been stable with no worsening.  Baseline diarrhea is also stable.  He felt mildly nauseous which was controlled with Compazine.  Appetite is 50% and energy levels are 75%.  Left-sided abdominal pain is stable at 6 out of 10.    REVIEW OF SYSTEMS:  Review of Systems  Gastrointestinal:  Positive for diarrhea and nausea.  Neurological:  Positive for dizziness.  All other systems reviewed and are negative.    PAST MEDICAL/SURGICAL HISTORY:  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   Stage 3   Anemia    Anxiety    Arthritis    Barrett's esophagus    Blood transfusion    Bowel obstruction ([place]) 05/13/2012   Recurrent   Bronchitis    Chest pain at rest    Chronic abdominal pain    Erosive esophagitis    ETOH abuse     quit 03/2010   GERD (gastroesophageal reflux disease)    Hx of Clostridium difficile infection 01/2012   Hypertension    Ileus ([place])    Iron deficiency anemia 03/23/2016   Lung cancer ([facility])    Obstruction of bowel ([facility]) 03/03/2014   Osteoporosis    Personal history of PE (pulmonary embolism) 10/01/2010   Pneumonia    Pulmonary embolism ([facility] [facility]) 02/2010   Recurrent upper respiratory infection (URI)    Renal disorder    S/P endoscopy 09/28/2010   erosive reflux esophagitis, Billroth I anatomy   S/P partial gastrectomy 1980s   Seizures ([place])    was on meds for 6 months, Unknown etiology, last seizure was 2017.   Shortness of breath    TIA (transient ischemic attack) 12/2009   Vitamin B12 deficiency    Past Surgical History:  Procedure Laterality Date   ABDOMINAL ADHESION SURGERY  03/04/15   @ [facility]   ABDOMINAL EXPLORATION SURGERY     abdominal sugery     for bowel obstruction x 8, all in 1980s, except for one in 07/2010   APPENDECTOMY  1980s   Billroth 1 hemigastrectomy  1980s   per patient for benign duodenal tumor   CARDIAC CATHETERIZATION  07/17/2012   CHOLECYSTECTOMY  1980s   COLON SURGERY  May 2012   left hemicolectomy, colon cancer found at time of surgery for bowel obstruction  COLON SURGERY  10/12/2018   At [facility]: Resection of right colon and terminal ileum with creation of ileorectal anastomosis for large bowel obstruction.  Cecum and ascending colon noted to be attached to the rectum.   COLONOSCOPY  03/18/2011   anastomosis at 35cm. Several adenomatous polyps removed. Sigmoid diverticulosis. Next TCS 02/2013   COLONOSCOPY N/A 07/24/2012   YQM:VHQION post segmental resection with normal-appearing colonic anastomosis aside from an adjacent polyp-removed as described above. Rectal polyp-removed as described above. CT findings appear to have been artifactual. tubular adenomas/prolapsed type polyp.   COLONOSCOPY N/A 05/15/2015   Procedure: COLONOSCOPY;  Surgeon: [name],  MD;  Location: [facility];  Service: Endoscopy;  Laterality: N/A;   COLONOSCOPY  09/26/2018   at [facility]: prep not adequate for colon cancer surveillance.  Prior end-to-end colonic anastomosis in the rectosigmoid region.  This was patent and characterized by healthy-appearing mucosa.  Anastomosis was traversed.  Normal terminal ileum.   COLONOSCOPY WITH PROPOFOL N/A 11/04/2016   Dr. [name], status post subtotal colectomy with normal-appearing residual lower GI tract.  Next colonoscopy in 5 years.   ESOPHAGOGASTRODUODENOSCOPY  09/28/2010   ESOPHAGOGASTRODUODENOSCOPY  12/01/2010   Cervical web status post dilation, erosive esophagitis, B1 hemigastrectomy, inflamed anastomosis   ESOPHAGOGASTRODUODENOSCOPY  04/16/2011   excoriation at [facility] c/w trauma/M-W tear, friable gastric anastomosis, dilation efferent limb   ESOPHAGOGASTRODUODENOSCOPY N/A 06/03/2014   Dr.[name]- cervcal esopphageal web s/p dilation. abnormal distal esophagus bx= barretts esophagus   ESOPHAGOGASTRODUODENOSCOPY N/A 05/15/2015   Procedure: ESOPHAGOGASTRODUODENOSCOPY (EGD);  Surgeon: [name], MD;  Location: [facility];  Service: Endoscopy;  Laterality: N/A;  230   ESOPHAGOGASTRODUODENOSCOPY (EGD) WITH ESOPHAGEAL DILATION  02/25/2012   GEX:BMWUXLKG esophageal web-s/p dilation anddisruption as described above. Status post prior gastric with Billroth I configuration. Abnormal gastric mucosa at the anastomosis. Gastric biopsy showed mild chronic inflammation but no H. pylori    ESOPHAGOGASTRODUODENOSCOPY (EGD) WITH PROPOFOL N/A 11/04/2016   Dr. [name]: Reflux esophagitis, small hiatal hernia status post hemigastrectomy.  Single patent efferent small bowel limb appeared normal, 2 x 2 centimeter tongue of salmon epithelium again seen, esophageal dilation.  Biopsies consistent with reflux changes, not Barrett's however this was confirmed on prior EGDs.  Offer 3-year follow-up EGD August 2021.   ESOPHAGOGASTRODUODENOSCOPY (EGD) WITH PROPOFOL N/A  03/03/2020   Procedure: ESOPHAGOGASTRODUODENOSCOPY (EGD) WITH PROPOFOL;  Surgeon: [name], MD;  [facility];  Service: Endoscopy;  Laterality: N/A;  3:00pm   HERNIA REPAIR     right inguinal   MALONEY DILATION N/A 06/03/2014   Procedure: [name] DILATION;  Surgeon: [name], MD;  Location[facility];  Service: Endoscopy;  Laterality: N/A;   MALONEY DILATION N/A 05/15/2015   Procedure: [name] DILATION;  Surgeon: [name], MD;  Location[facility];  Service: Endoscopy;  Laterality: N/A;   MALONEY DILATION N/A 11/04/2016   Procedure: [name] DILATION;  Surgeon: [name], MD;  Location[facility];  Service: Endoscopy;  Laterality: N/A;   MALONEY DILATION N/A 03/03/2020   Procedure: [name] DILATION;  Surgeon: [name], MD;  Location[facility];  Service: Endoscopy;  Laterality: N/A;   PORTACATH PLACEMENT     SAVORY DILATION N/A 06/03/2014   Procedure: SAVORY DILATION;  Surgeon: [name], MD;  Location[facility];  Service: Endoscopy;  Laterality: N/A;     SOCIAL HISTORY:  Social History   Socioeconomic History   Marital status: Married    Spouse  name: Not on file   Number of children: 3   Years of education: Not on file   Highest education level: Not on file  Occupational History    Employer: [employer]  Tobacco Use   Smoking status: Former    Packs/day: 0.50    Years: 40.00    Total pack years: 20.00    Types: Cigarettes    Quit date: 12/20/2012    Years since quitting: 8.8   Smokeless tobacco: Never  Vaping Use   Vaping Use: Never used  Substance and Sexual Activity   Alcohol use: No   Drug use: No   Sexual activity: Never  Other Topics Concern   Not on file  Social History Narrative   Not on file   Social Determinants of Health   Financial Resource Strain: Not on file  Food Insecurity: Not on file  Transportation Needs: Not on file  Physical Activity: Not on file  Stress: Not on file  Social  Connections: Not on file  Intimate Partner Violence: Not on file    FAMILY HISTORY:  Family History  Problem Relation Age of Onset   Hypertension Mother    Arthritis Mother    Pneumonia Mother    Hypertension Father    Heart attack Father    Colon cancer Neg Hx     CURRENT MEDICATIONS:  Outpatient Encounter Medications as of 10/27/2021  Medication Sig   ammonium lactate (AMLACTIN) 12 % cream Apply 1 g topically See admin instructions. APPLY CREAM TO DRY SKIN ON LEGS ARMS HANDS AND BACK AREA ONCE TO TWICE DAILY (AVOID FACE GROIN UNDERARMS)   apixaban (ELIQUIS) 5 MG TABS tablet Take 1 tablet (5 mg total) by mouth 2 (two) times daily.   atorvastatin (LIPITOR) 20 MG tablet Take by mouth.   Bacillus Coagulans-Inulin (PROBIOTIC) 1-250 BILLION-MG CAPS Take by mouth.   baclofen (LIORESAL) 10 MG tablet as needed.   carvedilol (COREG) 25 MG tablet Take 0.5 tablets (12.5 mg total) by mouth 2 (two) times daily with a meal. (Patient taking differently: Take 25 mg by mouth 2 (two) times daily with a meal. Takes 25 mg 2 times daily.)   Cholecalciferol (VITAMIN D) 2000 units CAPS Take 2,000 Units by mouth daily.   Cholecalciferol (VITAMIN D3) 10 MCG (400 UNIT) tablet Take by mouth.   cyanocobalamin 100 MCG tablet Take by mouth.   diclofenac Sodium (VOLTAREN) 1 % GEL Apply topically.   diphenoxylate-atropine (LOMOTIL) 2.5-0.025 MG tablet TAKE 1 TABLET BY MOUTH 4 TIMES A DAY   ferrous sulfate 325 (65 FE) MG EC tablet Take by mouth.   gabapentin (NEURONTIN) 100 MG capsule TAKE 1 CAPSULE BY MOUTH EVERY DAY AT BEDTIME   HYDROcodone-acetaminophen (NORCO/VICODIN) 5-325 MG tablet Take 1 tablet by mouth every 8 (eight) hours as needed for moderate pain.   loperamide (IMODIUM A-D) 2 MG tablet Take 2 mg by mouth in the morning and at bedtime.   magnesium oxide (MAG-OX) 400 (240 Mg) MG tablet Take 1 tablet (400 mg total) by mouth 2 (two) times daily.   mirtazapine (REMERON) 30 MG tablet TAKE 1 TABLET BY MOUTH AT  BEDTIME   Multiple Vitamin (MULTIVITAMIN WITH MINERALS) TABS tablet Take 1 tablet by mouth daily.   ondansetron (ZOFRAN-ODT) 4 MG disintegrating tablet TAKE 1 TABLET BY MOUTH EVERY 4 HOURS AS NEEDED FOR NAUSEA AND VOMITING   Pancrelipase, Lip-Prot-Amyl, (CREON PO) Take 3 capsules by mouth with meals, and take 2 capsules with snacks   pantoprazole (PROTONIX)  40 MG tablet Take 1 tablet (40 mg total) by mouth 2 (two) times daily before a meal.   PREVALITE 4 g packet Take 1 packet (4 g total) by mouth daily. DO NOT TAKE WITHIN 2 HOURS OF OTHER MEDICATIONS   sucralfate (CARAFATE) 1 GM/10ML suspension TAKE 10 MLS (1 G TOTAL) BY MOUTH 4 (FOUR) TIMES DAILY AS NEEDED. FOR BREAKTHROUGH HEARTBURN   traMADol (ULTRAM) 50 MG tablet Take 1 tablet by mouth every 6 (six) hours as needed.   [DISCONTINUED] enoxaparin (LOVENOX) 60 MG/0.6ML injection Inject 60 mg into the skin daily.   lidocaine-prilocaine (EMLA) cream Apply to port site 1 hour prior to port access (Patient not taking: Reported on 10/27/2021)   prochlorperazine (COMPAZINE) 10 MG tablet Take by mouth. (Patient not taking: Reported on 10/27/2021)   prochlorperazine (COMPAZINE) 10 MG tablet Take 1 tablet (10 mg total) by mouth every 6 (six) hours as needed (Nausea or vomiting). (Patient not taking: Reported on 10/27/2021)   Facility-Administered Encounter Medications as of 10/27/2021  Medication   heparin lock flush 100 unit/mL   sodium chloride flush (NS) 0.9 % injection 10 mL    ALLERGIES:  No Known Allergies   PHYSICAL EXAM:  ECOG Performance status: 1  There were no vitals filed for this visit. There were no vitals filed for this visit. Physical Exam Vitals reviewed.  Constitutional:      Appearance: Normal appearance.  Cardiovascular:     Rate and Rhythm: Normal rate and regular rhythm.     Heart sounds: Normal heart sounds.  Pulmonary:     Breath sounds: Normal breath sounds.  Abdominal:     Palpations: Abdomen is soft. There is no mass.   Neurological:     General: No focal deficit present.     Mental Status: He is alert and oriented to person, place, and time.  Psychiatric:        Mood and Affect: Mood normal.        Behavior: Behavior normal.      LABORATORY DATA:  I have reviewed the labs as listed.  CBC    Component Value Date/Time   WBC 3.6 (L) 10/27/2021 0816   RBC 3.68 (L) 10/27/2021 0816   HGB 11.0 (L) 10/27/2021 0816   HCT 33.3 (L) 10/27/2021 0816   PLT 149 (L) 10/27/2021 0816   MCV 90.5 10/27/2021 0816   MCH 29.9 10/27/2021 0816   MCHC 33.0 10/27/2021 0816   RDW 13.3 10/27/2021 0816   LYMPHSABS 1.2 10/27/2021 0816   MONOABS 1.1 (H) 10/27/2021 0816   EOSABS 0.1 10/27/2021 0816   BASOSABS 0.0 10/27/2021 0816      Latest Ref Rng & Units 10/27/2021    8:16 AM 10/13/2021    7:56 AM 10/01/2021    7:56 AM  CMP  Glucose 70 - 99 mg/dL 124  137  92   BUN 8 - 23 mg/dL 12  7  13    Creatinine 0.61 - 1.24 mg/dL 1.48  1.56  1.72   Sodium 135 - 145 mmol/L 133  139  135   Potassium 3.5 - 5.1 mmol/L 4.0  4.1  4.0   Chloride 98 - 111 mmol/L 108  113  105   CO2 22 - 32 mmol/L 21  21  25    Calcium 8.9 - 10.3 mg/dL 7.9  8.0  7.8   Total Protein 6.5 - 8.1 g/dL 5.6  5.7  5.8   Total Bilirubin 0.3 - 1.2 mg/dL 0.4  0.4  0.5  Alkaline Phos 38 - 126 U/L 65  74  72   AST 15 - 41 U/L 11  14  12    ALT 0 - 44 U/L 10  10  13      DIAGNOSTIC IMAGING:  I have independently reviewed the scans and discussed with the patient.  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  No lymphadenopathy. - EUS and FNA (09/21/2021): Oval mass in the pancreatic tail, hypoechoic, 20 mm x 17 mm.  Remainder of the pancreas shows decreased visualization of the pancreas. - Pathology: Invasive adenocarcinoma, ductal type, moderately to poorly differentiated.  Background desmoplastic stroma, blood and  pancreatic tissue.  Morphologically distinct from prior lung adenocarcinoma.  TTF-1 and Napsin A were negative. - Patient evaluated by Dr. Dayton Scrape at [facility] for pancreatic resection. - CA 19-9 (10/02/2021): 6.47 - PET CT scan (10/02/2021): Ill-defined 4 cm mass in the left upper quadrant in the region of the pancreatic tail with mild hypermetabolism, SUV 4.7.  Soft tissue nodule in the gastrohepatic ligament measuring 2 x 1.3 cm, SUV 4.0.  Suspicious for metastatic lymphadenopathy.  9 mm mediastinal lymph node and AP window is hypermetabolic SUV 4.9.  Borderline enlarged left hilar lymph nodes show hypermetabolic activity SUV 3.4.  Previously seen subpleural nodule in the superior right lower lobe has nearly completely resolved.  Overall mild hypermetabolic mediastinal and left hilar lymph nodes which are nonspecific.  May be reactive in etiology although metastatic disease cannot definitely be excluded.   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 -Last EGD/colonoscopy on 05/15/2015 was normal. - CT scan in October 2018 was negative for metastatic disease. 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  Margins negative.  2 hilar lymph nodes were negative. - No adjuvant chemotherapy was needed.    Social/family history: - Lives at home with his wife.  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  No chemical exposure.  Quit smoking in 2014 and smoked 1 pack/day for 44 years. - Paternal cousin has bone cancer.    PLAN:   Stage IIB adenocarcinoma of the pancreatic tail: - He has tolerated first cycle of FOLFIRINOX reasonably well. - I have contacted  Dr. Sander Nephew at [facility] and updated her of the treatment plan. - We have recommended NGS testing and germline mutation testing which is pending. - Reviewed labs today which shows normal LFTs with low albumin.  CBC was grossly normal with ANC of 1.1. - We will proceed with cycle 2 of FOLFIRINOX.  I will add G-CSF on day 3. - RTC 2 weeks for follow-up.  2.  Abdominal pain: - Left upper quadrant pain 6 out of 10 is stable.  Not requiring any pain medications.   3. Iron deficiency: - Continue iron tablet daily.  Hemoglobin is 11.0.   4. Peripheral neuropathy: - Occasional numbness in the toes has been stable.  Continue gabapentin at nighttime.  Oxaliplatin was dose reduced.  5.  Weight loss: - He lost 15 pounds since March due to decreased appetite and decreased size in portions. - No change in weight  so far.  We will continue close monitoring.  6.  Chronic diarrhea: - Chronic diarrhea since surgery is stable and managed with Imodium and Lomotil.  Irinotecan dose was reduced.  No worsening of diarrhea.        Orders placed this encounter:  No orders of the defined types were placed in this encounter.     Derek Jack, MD [place] [phone]

## 2021-10-29 ENCOUNTER — Inpatient Hospital Stay: Payer: Medicare Other

## 2021-10-29 VITALS — BP 114/74 | HR 59 | Temp 96.1°F | Resp 18

## 2021-10-29 DIAGNOSIS — Z5111 Encounter for antineoplastic chemotherapy: Secondary | ICD-10-CM | POA: Diagnosis not present

## 2021-10-29 DIAGNOSIS — C252 Malignant neoplasm of tail of pancreas: Secondary | ICD-10-CM

## 2021-10-29 MED ORDER — SODIUM CHLORIDE 0.9% FLUSH
10.0000 mL | INTRAVENOUS | Status: DC | PRN
  Administered 2021-10-29: 10 mL

## 2021-10-29 MED ORDER — HEPARIN SOD (PORK) LOCK FLUSH 100 UNIT/ML IV SOLN
500.0000 [IU] | Freq: Once | INTRAVENOUS | Status: AC | PRN
  Administered 2021-10-29: 500 [IU]

## 2021-10-29 MED ORDER — PEGFILGRASTIM-CBQV 6 MG/0.6ML ~~LOC~~ SOSY
6.0000 mg | PREFILLED_SYRINGE | Freq: Once | SUBCUTANEOUS | Status: AC
  Administered 2021-10-29: 6 mg via SUBCUTANEOUS
  Filled 2021-10-29: qty 0.6

## 2021-10-29 NOTE — Progress Notes (Signed)
Patient for chemotherapy pump disconnect with no complaints voiced.  Patients port flushed without difficulty.  Good blood return noted with no bruising or swelling noted at site.  Band aid applied.  VSS with discharge and left ambulatory with no s/s of distress noted.   

## 2021-10-29 NOTE — Patient Instructions (Signed)
[facility]  Discharge Instructions: Thank you for choosing [facility] to provide your oncology and hematology care.  If you have a lab appointment with the Cancer Center, please come in thru the Main Entrance and check in at the main information desk.  Wear comfortable clothing and clothing appropriate for easy access to any Portacath or PICC line.   We strive to give you quality time with your provider. You may need to reschedule your appointment if you arrive late (15 or more minutes).  Arriving late affects you and other patients whose appointments are after yours.  Also, if you miss three or more appointments without notifying the office, you may be dismissed from the clinic at the provider's discretion.      For prescription refill requests, have your pharmacy contact our office and allow 72 hours for refills to be completed.      To help prevent nausea and vomiting after your treatment, we encourage you to take your nausea medication as directed.  BELOW ARE SYMPTOMS THAT SHOULD BE REPORTED IMMEDIATELY: *FEVER GREATER THAN 100.4 F (38 C) OR HIGHER *CHILLS OR SWEATING *NAUSEA AND VOMITING THAT IS NOT CONTROLLED WITH YOUR NAUSEA MEDICATION *UNUSUAL SHORTNESS OF BREATH *UNUSUAL BRUISING OR BLEEDING *URINARY PROBLEMS (pain or burning when urinating, or frequent urination) *BOWEL PROBLEMS (unusual diarrhea, constipation, pain near the anus) TENDERNESS IN MOUTH AND THROAT WITH OR WITHOUT PRESENCE OF ULCERS (sore throat, sores in mouth, or a toothache) UNUSUAL RASH, SWELLING OR PAIN  UNUSUAL VAGINAL DISCHARGE OR ITCHING   Items with * indicate a potential emergency and should be followed up as soon as possible or go to the Emergency Department if any problems should occur.  Please show the CHEMOTHERAPY ALERT CARD or IMMUNOTHERAPY ALERT CARD at check-in to the Emergency Department and triage nurse.  Should you have questions after your visit or need to  cancel or reschedule your appointment, please contact [facility] [phone]  and follow the prompts.  Office hours are 8:00 a.m. to 4:30 p.m. Monday - Friday. Please note that voicemails left after 4:00 p.m. may not be returned until the following business day.  We are closed weekends and major holidays. You have access to a nurse at all times for urgent questions. Please call the main number to the clinic [phone] and follow the prompts.  For any non-urgent questions, you may also contact your provider using MyChart. We now offer e-Visits for anyone 18 and older to request care online for non-urgent symptoms. For details visit [email or web address].   Also download the MyChart app! Go to the app store, search "MyChart", open the app, select [facility], and log in with your MyChart username and password.  Masks are optional in the cancer centers. If you would like for your care team to wear a mask while they are taking care of you, please let them know. For doctor visits, patients may have with them one support person who is at least 71 years old. At this time, visitors are not allowed in the infusion area.  

## 2021-10-30 MED ORDER — LORATADINE 10 MG PO TABS
ORAL_TABLET | ORAL | Status: AC
  Filled 2021-10-30: qty 1

## 2021-10-30 MED ORDER — ACETAMINOPHEN 325 MG PO TABS
ORAL_TABLET | ORAL | Status: AC
  Filled 2021-10-30: qty 2

## 2021-11-05 ENCOUNTER — Other Ambulatory Visit: Payer: Self-pay | Admitting: *Deleted

## 2021-11-05 MED ORDER — HYDROCODONE-ACETAMINOPHEN 5-325 MG PO TABS: 1.0000 | ORAL_TABLET | Freq: Three times a day (TID) | ORAL | 0 refills | Status: DC | PRN

## 2021-11-11 ENCOUNTER — Inpatient Hospital Stay: Payer: Medicare Other

## 2021-11-11 ENCOUNTER — Inpatient Hospital Stay (HOSPITAL_BASED_OUTPATIENT_CLINIC_OR_DEPARTMENT_OTHER): Payer: Medicare Other | Admitting: Hematology

## 2021-11-11 VITALS — BP 115/70 | HR 75 | Temp 96.4°F | Resp 18

## 2021-11-11 DIAGNOSIS — C252 Malignant neoplasm of tail of pancreas: Secondary | ICD-10-CM

## 2021-11-11 DIAGNOSIS — C259 Malignant neoplasm of pancreas, unspecified: Secondary | ICD-10-CM

## 2021-11-11 DIAGNOSIS — Z95828 Presence of other vascular implants and grafts: Secondary | ICD-10-CM

## 2021-11-11 DIAGNOSIS — Z5111 Encounter for antineoplastic chemotherapy: Secondary | ICD-10-CM | POA: Diagnosis not present

## 2021-11-11 DIAGNOSIS — D508 Other iron deficiency anemias: Secondary | ICD-10-CM

## 2021-11-11 DIAGNOSIS — C189 Malignant neoplasm of colon, unspecified: Secondary | ICD-10-CM

## 2021-11-11 LAB — CBC WITH DIFFERENTIAL/PLATELET
Abs Immature Granulocytes: 1.6 10*3/uL — ABNORMAL HIGH (ref 0.00–0.07)
Basophils Absolute: 0 10*3/uL (ref 0.0–0.1)
Basophils Relative: 0 %
Eosinophils Absolute: 0 10*3/uL (ref 0.0–0.5)
Eosinophils Relative: 0 %
HCT: 31.7 % — ABNORMAL LOW (ref 39.0–52.0)
Hemoglobin: 10.7 g/dL — ABNORMAL LOW (ref 13.0–17.0)
Lymphocytes Relative: 12 %
Lymphs Abs: 3.8 10*3/uL (ref 0.7–4.0)
MCH: 30.1 pg (ref 26.0–34.0)
MCHC: 33.8 g/dL (ref 30.0–36.0)
MCV: 89.3 fL (ref 80.0–100.0)
Metamyelocytes Relative: 5 %
Monocytes Absolute: 1 10*3/uL (ref 0.1–1.0)
Monocytes Relative: 3 %
Neutro Abs: 25.4 10*3/uL — ABNORMAL HIGH (ref 1.7–7.7)
Neutrophils Relative %: 80 %
Platelets: 229 10*3/uL (ref 150–400)
RBC: 3.55 MIL/uL — ABNORMAL LOW (ref 4.22–5.81)
RDW: 14 % (ref 11.5–15.5)
WBC: 31.8 10*3/uL — ABNORMAL HIGH (ref 4.0–10.5)
nRBC: 0.1 % (ref 0.0–0.2)

## 2021-11-11 LAB — COMPREHENSIVE METABOLIC PANEL
ALT: 12 U/L (ref 0–44)
AST: 16 U/L (ref 15–41)
Albumin: 2.3 g/dL — ABNORMAL LOW (ref 3.5–5.0)
Alkaline Phosphatase: 108 U/L (ref 38–126)
Anion gap: 7 (ref 5–15)
BUN: 17 mg/dL (ref 8–23)
CO2: 20 mmol/L — ABNORMAL LOW (ref 22–32)
Calcium: 7.8 mg/dL — ABNORMAL LOW (ref 8.9–10.3)
Chloride: 108 mmol/L (ref 98–111)
Creatinine, Ser: 1.91 mg/dL — ABNORMAL HIGH (ref 0.61–1.24)
GFR, Estimated: 37 mL/min — ABNORMAL LOW (ref >60)
Glucose, Bld: 95 mg/dL (ref 70–99)
Potassium: 3.6 mmol/L (ref 3.5–5.1)
Sodium: 135 mmol/L (ref 135–145)
Total Bilirubin: 0.5 mg/dL (ref 0.3–1.2)
Total Protein: 5.5 g/dL — ABNORMAL LOW (ref 6.5–8.1)

## 2021-11-11 LAB — MAGNESIUM: Magnesium: 1.6 mg/dL — ABNORMAL LOW (ref 1.7–2.4)

## 2021-11-11 MED ORDER — SODIUM CHLORIDE 0.9 % IV SOLN: INTRAVENOUS | Status: DC

## 2021-11-11 MED ORDER — SODIUM CHLORIDE 0.9 % IV SOLN
510.0000 mg | Freq: Once | INTRAVENOUS | Status: AC
  Administered 2021-11-11: 510 mg via INTRAVENOUS
  Filled 2021-11-11: qty 17

## 2021-11-11 MED ORDER — LORATADINE 10 MG PO TABS
10.0000 mg | ORAL_TABLET | Freq: Once | ORAL | Status: AC
  Administered 2021-11-11: 10 mg via ORAL
  Filled 2021-11-11: qty 1

## 2021-11-11 MED ORDER — HEPARIN SOD (PORK) LOCK FLUSH 100 UNIT/ML IV SOLN
500.0000 [IU] | Freq: Once | INTRAVENOUS | Status: AC
  Administered 2021-11-11: 500 [IU] via INTRAVENOUS

## 2021-11-11 MED ORDER — ACETAMINOPHEN 325 MG PO TABS
650.0000 mg | ORAL_TABLET | Freq: Once | ORAL | Status: AC
  Administered 2021-11-11: 650 mg via ORAL
  Filled 2021-11-11: qty 2

## 2021-11-11 MED ORDER — POTASSIUM CHLORIDE IN NACL 20-0.9 MEQ/L-% IV SOLN
INTRAVENOUS | Status: DC
  Filled 2021-11-11 (×2): qty 1000

## 2021-11-11 MED ORDER — MAGNESIUM SULFATE 2 GM/50ML IV SOLN
2.0000 g | Freq: Once | INTRAVENOUS | Status: AC
  Administered 2021-11-11: 2 g via INTRAVENOUS
  Filled 2021-11-11: qty 50

## 2021-11-11 MED ORDER — SODIUM CHLORIDE 0.9% FLUSH
10.0000 mL | Freq: Once | INTRAVENOUS | Status: AC
  Administered 2021-11-11: 10 mL via INTRAVENOUS

## 2021-11-11 NOTE — Progress Notes (Signed)
We will hold tx today per MD for elevated creatinine, 9 lb weight loss, and pt feeling generally weak. Plan to give IVF w/electrolytes and Feraheme today.

## 2021-11-11 NOTE — Progress Notes (Signed)
Patient presents today for possible treatment. Patient and labs assessed by Dr. Delton Coombes. No treatment today d/t elevated creatinine, weight loss, and feeling weak, with orders for patient to receive house fluids and feraheme today. Patient also will be scheduled for feraheme with treatment next week.    Patient tolerated iron infusion with no complaints voiced. Peripheral IV site clean and dry with good blood return noted before and after infusion.  Patient tolerated hydration therapy with no complaints voiced. Side effects with management reviewed with understanding verbalized. Port site clean and dry with no bruising or swelling noted at site. Good blood return noted before and after administration of therapy. Band aid applied. Patient left in satisfactory condition with VSS and no s/s of distress noted.

## 2021-11-11 NOTE — Progress Notes (Signed)
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   CURRENT THERAPY: FOLFIRINOX every 2 weeks  BRIEF ONCOLOGIC HISTORY:  Oncology History  Pancreatic cancer ([place])  10/07/2021 Initial Diagnosis   Pancreatic cancer ([place])   10/13/2021 -  Chemotherapy   Patient is on Treatment Plan : PANCREAS Modified FOLFIRINOX q14d x 4 cycles       CANCER STAGING:  Cancer Staging  Adenocarcinoma of colon with mucinous features Staging form: Colon and Rectum, AJCC 7th Edition - Clinical: Stage IIIB (T3, N1, M0) - Signed by Baird Cancer, PA on 09/22/2010  Pancreatic cancer [facility]) Staging form: Exocrine Pancreas, AJCC 8th Edition - Clinical stage from 10/07/2021: Stage IIB (cT1, cN1, cM0) - Unsigned    INTERVAL HISTORY:  [name] 71 y.o. male seen for follow-up of pancreatic cancer.  He has received cycle 2 of FOLFIRINOX on 10/27/2021.  He reported feeling very tired.  He also had nausea and diarrhea but no vomiting.  He lost 9 pounds.  Yesterday the first day he felt better.   REVIEW OF SYSTEMS:  Review of Systems  Gastrointestinal:  Positive for diarrhea and nausea.  Neurological:  Positive for dizziness.  All other systems reviewed and are negative.    PAST MEDICAL/SURGICAL HISTORY:  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   Stage 3   Anemia    Anxiety    Arthritis    Barrett's esophagus    Blood transfusion    Bowel obstruction ([place]) 05/13/2012   Recurrent   Bronchitis    Chest pain at rest    Chronic abdominal pain    Erosive esophagitis    ETOH abuse    quit 03/2010   GERD (gastroesophageal reflux disease)    Hx of Clostridium difficile infection 01/2012   Hypertension    Ileus ([place])     Iron deficiency anemia 03/23/2016   Lung cancer ([place])    Obstruction of bowel ([place]) 03/03/2014   Osteoporosis    Personal history of PE (pulmonary embolism) 10/01/2010   Pneumonia    Pulmonary embolism ([place]) 02/2010   Recurrent upper respiratory infection (URI)    Renal disorder    S/P endoscopy 09/28/2010   erosive reflux esophagitis, Billroth I anatomy   S/P partial gastrectomy 1980s   Seizures ([place])    was on meds for 6 months, Unknown etiology, last seizure was 2017.   Shortness of breath    TIA (transient ischemic attack) 12/2009   Vitamin B12 deficiency    Past Surgical History:  Procedure Laterality Date   ABDOMINAL ADHESION SURGERY  03/04/15   @ [facility]   ABDOMINAL EXPLORATION SURGERY     abdominal sugery     for bowel obstruction x 8, all in 1980s, except for one in 07/2010   APPENDECTOMY  1980s   Billroth 1 hemigastrectomy  1980s   per patient for benign duodenal tumor   CARDIAC CATHETERIZATION  07/17/2012   CHOLECYSTECTOMY  1980s   COLON SURGERY  May 2012   left hemicolectomy, colon cancer found at time of surgery for bowel obstruction   COLON SURGERY  10/12/2018   At [facility]: Resection of right colon and terminal ileum with creation of ileorectal anastomosis for large bowel obstruction.  Cecum and ascending colon noted to be attached to the rectum.   COLONOSCOPY  03/18/2011   anastomosis at 35cm. Several adenomatous polyps removed. Sigmoid diverticulosis. Next TCS 02/2013   COLONOSCOPY N/A 07/24/2012   IFO:YDXAJO post segmental resection with normal-appearing colonic anastomosis aside from an adjacent polyp-removed as described above. Rectal polyp-removed as described above. CT findings appear to have been artifactual. tubular adenomas/prolapsed type polyp.   COLONOSCOPY N/A 05/15/2015   Procedure: COLONOSCOPY;  Surgeon: [name], MD;  [facility];  Service: Endoscopy;  Laterality: N/A;   COLONOSCOPY  09/26/2018   at [facility]: prep not adequate for colon cancer  surveillance.  Prior end-to-end colonic anastomosis in the rectosigmoid region.  This was patent and characterized by healthy-appearing mucosa.  Anastomosis was traversed.  Normal terminal ileum.   COLONOSCOPY WITH PROPOFOL N/A 11/04/2016   Dr. [name], status post subtotal colectomy with normal-appearing residual lower GI tract.  Next colonoscopy in 5 years.   ESOPHAGOGASTRODUODENOSCOPY  09/28/2010   ESOPHAGOGASTRODUODENOSCOPY  12/01/2010   Cervical web status post dilation, erosive esophagitis, B1 hemigastrectomy, inflamed anastomosis   ESOPHAGOGASTRODUODENOSCOPY  04/16/2011   excoriation at [facility] c/w trauma/M-W tear, friable gastric anastomosis, dilation efferent limb   ESOPHAGOGASTRODUODENOSCOPY N/A 06/03/2014   Dr.[name]- cervcal esopphageal web s/p dilation. abnormal distal esophagus bx= barretts esophagus   ESOPHAGOGASTRODUODENOSCOPY N/A 05/15/2015   Procedure: ESOPHAGOGASTRODUODENOSCOPY (EGD);  Surgeon: [name], MD;  [facility];  Service: Endoscopy;  Laterality: N/A;  230   ESOPHAGOGASTRODUODENOSCOPY (EGD) WITH ESOPHAGEAL DILATION  02/25/2012   INO:MVEHMCNO esophageal web-s/p dilation anddisruption as described above. Status post prior gastric with Billroth I configuration. Abnormal gastric mucosa at the anastomosis. Gastric biopsy showed mild chronic inflammation but no H. pylori    ESOPHAGOGASTRODUODENOSCOPY (EGD) WITH PROPOFOL N/A 11/04/2016   Dr. [name]: Reflux esophagitis, small hiatal hernia status post hemigastrectomy.  Single patent efferent small bowel limb appeared normal, 2 x 2 centimeter tongue of salmon epithelium again seen, esophageal dilation.  Biopsies consistent with reflux changes, not Barrett's however this was confirmed on prior EGDs.  Offer 3-year follow-up EGD August 2021.   ESOPHAGOGASTRODUODENOSCOPY (EGD) WITH PROPOFOL N/A 03/03/2020   Procedure: ESOPHAGOGASTRODUODENOSCOPY (EGD) WITH PROPOFOL;  Surgeon: [name], MD;  [facility];  Service:  Endoscopy;  Laterality: N/A;  3:00pm   HERNIA REPAIR     right inguinal   MALONEY DILATION N/A 06/03/2014   Procedure: [name] DILATION;  Surgeon: [name], MD;  [facility];  Service: Endoscopy;  Laterality: N/A;   MALONEY DILATION N/A 05/15/2015   Procedure: [name] DILATION;  Surgeon: [name], MD;  [facility];  Service: Endoscopy;  Laterality: N/A;   MALONEY DILATION N/A 11/04/2016   Procedure: [name] DILATION;  Surgeon: [name], MD;  [facility];  Service: Endoscopy;  Laterality: N/A;   MALONEY DILATION N/A 03/03/2020   Procedure: [name] DILATION;  Surgeon: [name], MD;  [facility];  Service: Endoscopy;  Laterality: N/A;   PORTACATH PLACEMENT     SAVORY DILATION N/A 06/03/2014   Procedure: SAVORY DILATION;  Surgeon: [name], MD;  [facility];  Service: Endoscopy;  Laterality: N/A;     SOCIAL HISTORY:  Social History   Socioeconomic History   Marital status: Married    Spouse name: Not on file   Number of children: 3   Years of education: Not on file   Highest education level: Not on  file  Occupational History    Employer: [employer]  Tobacco Use   Smoking status: Former    Packs/day: 0.50    Years: 40.00    Total pack years: 20.00    Types: Cigarettes    Quit date: 12/20/2012    Years since quitting: 8.8   Smokeless tobacco: Never  Vaping Use   Vaping Use: Never used  Substance and Sexual Activity   Alcohol use: No   Drug use: No   Sexual activity: Never  Other Topics Concern   Not on file  Social History Narrative   Not on file   Social Determinants of Health   Financial Resource Strain: Not on file  Food Insecurity: Not on file  Transportation Needs: Not on file  Physical Activity: Not on file  Stress: Not on file  Social Connections: Not on file  Intimate Partner Violence: Not on file    FAMILY HISTORY:  Family History  Problem Relation Age of Onset    Hypertension Mother    Arthritis Mother    Pneumonia Mother    Hypertension Father    Heart attack Father    Colon cancer Neg Hx     CURRENT MEDICATIONS:  Outpatient Encounter Medications as of 11/11/2021  Medication Sig   ammonium lactate (AMLACTIN) 12 % cream Apply 1 g topically See admin instructions. APPLY CREAM TO DRY SKIN ON LEGS ARMS HANDS AND BACK AREA ONCE TO TWICE DAILY (AVOID FACE GROIN UNDERARMS)   apixaban (ELIQUIS) 5 MG TABS tablet Take 1 tablet (5 mg total) by mouth 2 (two) times daily.   atorvastatin (LIPITOR) 20 MG tablet Take by mouth.   Bacillus Coagulans-Inulin (PROBIOTIC) 1-250 BILLION-MG CAPS Take by mouth.   baclofen (LIORESAL) 10 MG tablet as needed.   carvedilol (COREG) 25 MG tablet Take 0.5 tablets (12.5 mg total) by mouth 2 (two) times daily with a meal. (Patient taking differently: Take 25 mg by mouth 2 (two) times daily with a meal. Takes 25 mg 2 times daily.)   Cholecalciferol (VITAMIN D) 2000 units CAPS Take 2,000 Units by mouth daily.   Cholecalciferol (VITAMIN D3) 10 MCG (400 UNIT) tablet Take by mouth.   cyanocobalamin 100 MCG tablet Take by mouth.   diclofenac Sodium (VOLTAREN) 1 % GEL Apply topically.   diphenoxylate-atropine (LOMOTIL) 2.5-0.025 MG tablet TAKE 1 TABLET BY MOUTH 4 TIMES A DAY   ferrous sulfate 325 (65 FE) MG EC tablet Take by mouth.   gabapentin (NEURONTIN) 100 MG capsule TAKE 1 CAPSULE BY MOUTH EVERY DAY AT BEDTIME   HYDROcodone-acetaminophen (NORCO/VICODIN) 5-325 MG tablet Take 1 tablet by mouth every 8 (eight) hours as needed for moderate pain.   lidocaine-prilocaine (EMLA) cream Apply to port site 1 hour prior to port access (Patient not taking: Reported on 10/27/2021)   loperamide (IMODIUM A-D) 2 MG tablet Take 2 mg by mouth in the morning and at bedtime.   magnesium oxide (MAG-OX) 400 (240 Mg) MG tablet Take 1 tablet (400 mg total) by mouth 2 (two) times daily.   mirtazapine (REMERON) 30 MG tablet TAKE 1 TABLET BY MOUTH AT BEDTIME    Multiple Vitamin (MULTIVITAMIN WITH MINERALS) TABS tablet Take 1 tablet by mouth daily.   ondansetron (ZOFRAN-ODT) 4 MG disintegrating tablet TAKE 1 TABLET BY MOUTH EVERY 4 HOURS AS NEEDED FOR NAUSEA AND VOMITING   Pancrelipase, Lip-Prot-Amyl, (CREON PO) Take 3 capsules by mouth with meals, and take 2 capsules with snacks   pantoprazole (PROTONIX) 40 MG tablet Take  1 tablet (40 mg total) by mouth 2 (two) times daily before a meal.   PREVALITE 4 g packet Take 1 packet (4 g total) by mouth daily. DO NOT TAKE WITHIN 2 HOURS OF OTHER MEDICATIONS   prochlorperazine (COMPAZINE) 10 MG tablet Take by mouth. (Patient not taking: Reported on 10/27/2021)   prochlorperazine (COMPAZINE) 10 MG tablet Take 1 tablet (10 mg total) by mouth every 6 (six) hours as needed (Nausea or vomiting). (Patient not taking: Reported on 10/27/2021)   sucralfate (CARAFATE) 1 GM/10ML suspension TAKE 10 MLS (1 G TOTAL) BY MOUTH 4 (FOUR) TIMES DAILY AS NEEDED. FOR BREAKTHROUGH HEARTBURN   traMADol (ULTRAM) 50 MG tablet Take 1 tablet by mouth every 6 (six) hours as needed.   Facility-Administered Encounter Medications as of 11/11/2021  Medication   acetaminophen (TYLENOL) 325 MG tablet   heparin lock flush 100 unit/mL   loratadine (CLARITIN) 10 MG tablet   sodium chloride flush (NS) 0.9 % injection 10 mL    ALLERGIES:  No Known Allergies   PHYSICAL EXAM:  ECOG Performance status: 1  There were no vitals filed for this visit. There were no vitals filed for this visit. Physical Exam Vitals reviewed.  Constitutional:      Appearance: Normal appearance.  Cardiovascular:     Rate and Rhythm: Normal rate and regular rhythm.     Heart sounds: Normal heart sounds.  Pulmonary:     Breath sounds: Normal breath sounds.  Abdominal:     Palpations: Abdomen is soft. There is no mass.  Neurological:     General: No focal deficit present.     Mental Status: He is alert and oriented to person, place, and time.  Psychiatric:         Mood and Affect: Mood normal.        Behavior: Behavior normal.      LABORATORY DATA:  I have reviewed the labs as listed.  CBC    Component Value Date/Time   WBC 3.6 (L) 10/27/2021 0816   RBC 3.68 (L) 10/27/2021 0816   HGB 11.0 (L) 10/27/2021 0816   HCT 33.3 (L) 10/27/2021 0816   PLT 149 (L) 10/27/2021 0816   MCV 90.5 10/27/2021 0816   MCH 29.9 10/27/2021 0816   MCHC 33.0 10/27/2021 0816   RDW 13.3 10/27/2021 0816   LYMPHSABS 1.2 10/27/2021 0816   MONOABS 1.1 (H) 10/27/2021 0816   EOSABS 0.1 10/27/2021 0816   BASOSABS 0.0 10/27/2021 0816      Latest Ref Rng & Units 10/27/2021    8:16 AM 10/13/2021    7:56 AM 10/01/2021    7:56 AM  CMP  Glucose 70 - 99 mg/dL 124  137  92   BUN 8 - 23 mg/dL 12  7  13    Creatinine 0.61 - 1.24 mg/dL 1.48  1.56  1.72   Sodium 135 - 145 mmol/L 133  139  135   Potassium 3.5 - 5.1 mmol/L 4.0  4.1  4.0   Chloride 98 - 111 mmol/L 108  113  105   CO2 22 - 32 mmol/L 21  21  25    Calcium 8.9 - 10.3 mg/dL 7.9  8.0  7.8   Total Protein 6.5 - 8.1 g/dL 5.6  5.7  5.8   Total Bilirubin 0.3 - 1.2 mg/dL 0.4  0.4  0.5   Alkaline Phos 38 - 126 U/L 65  74  72   AST 15 - 41 U/L 11  14  12  ALT 0 - 44 U/L 10  10  13      DIAGNOSTIC IMAGING:  I have independently reviewed the scans and discussed with the patient.  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  No lymphadenopathy. - EUS and FNA (09/21/2021): Oval mass in the pancreatic tail, hypoechoic, 20 mm x 17 mm.  Remainder of the pancreas shows decreased visualization of the pancreas. - Pathology: Invasive adenocarcinoma, ductal type, moderately to poorly differentiated.  Background desmoplastic stroma, blood and pancreatic tissue.  Morphologically distinct from prior lung adenocarcinoma.  TTF-1 and Napsin A were negative. - Patient evaluated by Dr. Dayton Scrape at [facility]  for pancreatic resection. - CA 19-9 (10/02/2021): 6.47 - PET CT scan (10/02/2021): Ill-defined 4 cm mass in the left upper quadrant in the region of the pancreatic tail with mild hypermetabolism, SUV 4.7.  Soft tissue nodule in the gastrohepatic ligament measuring 2 x 1.3 cm, SUV 4.0.  Suspicious for metastatic lymphadenopathy.  9 mm mediastinal lymph node and AP window is hypermetabolic SUV 4.9.  Borderline enlarged left hilar lymph nodes show hypermetabolic activity SUV 3.4.  Previously seen subpleural nodule in the superior right lower lobe has nearly completely resolved.  Overall mild hypermetabolic mediastinal and left hilar lymph nodes which are nonspecific.  May be reactive in etiology although metastatic disease cannot definitely be excluded.        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 -Last EGD/colonoscopy on 05/15/2015 was normal. - CT scan in October 2018 was negative for metastatic disease. 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  Margins negative.  2 hilar lymph nodes were negative. - No adjuvant chemotherapy was needed.    Social/family history: - Lives at home with his wife.  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  No chemical exposure.  Quit smoking in 2014 and smoked 1 pack/day for 44 years. - Paternal cousin has bone cancer.    PLAN:   Stage IIB adenocarcinoma of the pancreatic tail: - Echo 2 of FOLFIRINOX on 10/27/2021. - He had a rough time after the last treatment.  Mostly in terms of fatigue.  Yesterday started to feel better. - Reviewed labs which showed  creatinine increased to 1.91 from baseline of 1.4-1.5.  Magnesium is low and potassium is borderline.  Recommend 1 L of fluid with electrolytes today. - Given his weight loss and fatigue, I have recommended holding treatment today.  He will come back next week and repeat labs and possibly do treatment. - RTC 3 weeks for follow-up prior to cycle 4.  2.  Abdominal pain: - Left upper quadrant pain is stable.   3. Iron deficiency: - Ferritin was 92 and percent saturation 26.  Hemoglobin 10.7.  Recommend Feraheme weekly x2.  He will receive first infusion today.   4. Peripheral neuropathy: - Occasional numbness in the toes has been stable.  Continue gabapentin at nighttime.  5.  Weight loss: - He lost 9 pounds in the last 1 month.  I will hold his treatment today.  Encouraged oral intake.  6.  Chronic diarrhea: - This is stable since surgery.  Continue Imodium and Lomotil as needed.        Orders placed this encounter:  No orders of the defined types were placed in this encounter.     Derek Jack, MD [place] [phone]

## 2021-11-11 NOTE — Patient Instructions (Signed)
[place] at [facility] Discharge Instructions   You were seen and examined today by Dr. Delton Coombes.  He reviewed the results of your lab work. Your kidney function is elevated and your hemoglobin is dropping. We will hold treatment today. We will give you iron and IV fluids today.   You have lost 9 lbs. Try to eat small frequent meals and use Boost or Ensure as meal supplementation.   Return as scheduled next week for treatment.    Thank you for choosing [place] at [facility] to provide your oncology and hematology care.  To afford each patient quality time with our provider, please arrive at least 15 minutes before your scheduled appointment time.   If you have a lab appointment with the [facility] please come in thru the Main Entrance and check in at the main information desk.  You need to re-schedule your appointment should you arrive 10 or more minutes late.  We strive to give you quality time with our providers, and arriving late affects you and other patients whose appointments are after yours.  Also, if you no show three or more times for appointments you may be dismissed from the clinic at the providers discretion.     Again, thank you for choosing [facility].  Our hope is that these requests will decrease the amount of time that you wait before being seen by our physicians.       _____________________________________________________________  Should you have questions after your visit to [facility], please contact our office at [phone] and follow the prompts.  Our office hours are 8:00 a.m. and 4:30 p.m. Monday - Friday.  Please note that voicemails left after 4:00 p.m. may not be returned until the following business day.  We are closed weekends and major holidays.  You do have access to a nurse 24-7, just call the main number to the clinic [phone] and do not press any options, hold on the  line and a nurse will answer the phone.    For prescription refill requests, have your pharmacy contact our office and allow 72 hours.    Due to Covid, you will need to wear a mask upon entering the hospital. If you do not have a mask, a mask will be given to you at the Main Entrance upon arrival. For doctor visits, patients may have 1 support person age 71 or older with them. For treatment visits, patients can not have anyone with them due to social distancing guidelines and our immunocompromised population.

## 2021-11-11 NOTE — Patient Instructions (Signed)
[place] [place]  Discharge Instructions: Thank you for choosing [facility] to provide your oncology and hematology care.  If you have a lab appointment with the [facility], please come in thru the Main Entrance and check in at the main information desk.  Wear comfortable clothing and clothing appropriate for easy access to any Portacath or PICC line.   We strive to give you quality time with your provider. You may need to reschedule your appointment if you arrive late (15 or more minutes).  Arriving late affects you and other patients whose appointments are after yours.  Also, if you miss three or more appointments without notifying the office, you may be dismissed from the clinic at the provider's discretion.      For prescription refill requests, have your pharmacy contact our office and allow 72 hours for refills to be completed.    Today you received the following Hydration fluids, and Feraheme, return as scheduled.   To help prevent nausea and vomiting after your treatment, we encourage you to take your nausea medication as directed.  BELOW ARE SYMPTOMS THAT SHOULD BE REPORTED IMMEDIATELY: *FEVER GREATER THAN 100.4 F (38 C) OR HIGHER *CHILLS OR SWEATING *NAUSEA AND VOMITING THAT IS NOT CONTROLLED WITH YOUR NAUSEA MEDICATION *UNUSUAL SHORTNESS OF BREATH *UNUSUAL BRUISING OR BLEEDING *URINARY PROBLEMS (pain or burning when urinating, or frequent urination) *BOWEL PROBLEMS (unusual diarrhea, constipation, pain near the anus) TENDERNESS IN MOUTH AND THROAT WITH OR WITHOUT PRESENCE OF ULCERS (sore throat, sores in mouth, or a toothache) UNUSUAL RASH, SWELLING OR PAIN  UNUSUAL VAGINAL DISCHARGE OR ITCHING   Items with * indicate a potential emergency and should be followed up as soon as possible or go to the Emergency Department if any problems should occur.  Please show the CHEMOTHERAPY ALERT CARD or IMMUNOTHERAPY ALERT CARD at check-in to the  Emergency Department and triage nurse.  Should you have questions after your visit or need to cancel or reschedule your appointment, please contact [facility] [phone]  and follow the prompts.  Office hours are 8:00 a.m. to 4:30 p.m. Monday - Friday. Please note that voicemails left after 4:00 p.m. may not be returned until the following business day.  We are closed weekends and major holidays. You have access to a nurse at all times for urgent questions. Please call the main number to the clinic [phone] and follow the prompts.  For any non-urgent questions, you may also contact your provider using MyChart. We now offer e-Visits for anyone 63 and older to request care online for non-urgent symptoms. For details visit [email or web address].   Also download the MyChart app! Go to the app store, search "MyChart", open the app, select [facility], and log in with your MyChart username and password.  Masks are optional in the cancer centers. If you would like for your care team to wear a mask while they are taking care of you, please let them know. You may have one support person who is at least 71 years old accompany you for your appointments.

## 2021-11-13 ENCOUNTER — Inpatient Hospital Stay: Payer: Medicare Other

## 2021-11-13 ENCOUNTER — Other Ambulatory Visit (HOSPITAL_COMMUNITY): Payer: Self-pay | Admitting: Hematology

## 2021-11-16 ENCOUNTER — Encounter: Payer: Self-pay | Admitting: Hematology

## 2021-11-16 ENCOUNTER — Encounter (HOSPITAL_COMMUNITY): Payer: Self-pay | Admitting: Hematology

## 2021-11-17 ENCOUNTER — Inpatient Hospital Stay: Payer: Medicare Other

## 2021-11-17 VITALS — BP 107/62 | HR 73 | Temp 97.0°F | Resp 18

## 2021-11-17 DIAGNOSIS — C259 Malignant neoplasm of pancreas, unspecified: Secondary | ICD-10-CM

## 2021-11-17 DIAGNOSIS — Z5111 Encounter for antineoplastic chemotherapy: Secondary | ICD-10-CM | POA: Diagnosis not present

## 2021-11-17 DIAGNOSIS — C252 Malignant neoplasm of tail of pancreas: Secondary | ICD-10-CM

## 2021-11-17 DIAGNOSIS — D508 Other iron deficiency anemias: Secondary | ICD-10-CM

## 2021-11-17 DIAGNOSIS — C189 Malignant neoplasm of colon, unspecified: Secondary | ICD-10-CM

## 2021-11-17 LAB — COMPREHENSIVE METABOLIC PANEL
ALT: 13 U/L (ref 0–44)
AST: 18 U/L (ref 15–41)
Albumin: 2.4 g/dL — ABNORMAL LOW (ref 3.5–5.0)
Alkaline Phosphatase: 103 U/L (ref 38–126)
Anion gap: 4 — ABNORMAL LOW (ref 5–15)
BUN: 13 mg/dL (ref 8–23)
CO2: 21 mmol/L — ABNORMAL LOW (ref 22–32)
Calcium: 7.6 mg/dL — ABNORMAL LOW (ref 8.9–10.3)
Chloride: 111 mmol/L (ref 98–111)
Creatinine, Ser: 1.21 mg/dL (ref 0.61–1.24)
GFR, Estimated: >60 mL/min (ref >60)
Glucose, Bld: 97 mg/dL (ref 70–99)
Potassium: 4.3 mmol/L (ref 3.5–5.1)
Sodium: 136 mmol/L (ref 135–145)
Total Bilirubin: 0.6 mg/dL (ref 0.3–1.2)
Total Protein: 5.2 g/dL — ABNORMAL LOW (ref 6.5–8.1)

## 2021-11-17 LAB — CBC WITH DIFFERENTIAL/PLATELET
Abs Immature Granulocytes: 1.8 10*3/uL — ABNORMAL HIGH (ref 0.00–0.07)
Basophils Absolute: 0 10*3/uL (ref 0.0–0.1)
Basophils Relative: 0 %
Eosinophils Absolute: 0 10*3/uL (ref 0.0–0.5)
Eosinophils Relative: 0 %
HCT: 31.6 % — ABNORMAL LOW (ref 39.0–52.0)
Hemoglobin: 10.5 g/dL — ABNORMAL LOW (ref 13.0–17.0)
Lymphocytes Relative: 14 %
Lymphs Abs: 3.7 10*3/uL (ref 0.7–4.0)
MCH: 30.2 pg (ref 26.0–34.0)
MCHC: 33.2 g/dL (ref 30.0–36.0)
MCV: 90.8 fL (ref 80.0–100.0)
Metamyelocytes Relative: 2 %
Monocytes Absolute: 1 10*3/uL (ref 0.1–1.0)
Monocytes Relative: 4 %
Myelocytes: 5 %
Neutro Abs: 19.7 10*3/uL — ABNORMAL HIGH (ref 1.7–7.7)
Neutrophils Relative %: 75 %
Platelets: 186 10*3/uL (ref 150–400)
RBC: 3.48 MIL/uL — ABNORMAL LOW (ref 4.22–5.81)
RDW: 14.9 % (ref 11.5–15.5)
WBC: 26.2 10*3/uL — ABNORMAL HIGH (ref 4.0–10.5)
nRBC: 0.1 % (ref 0.0–0.2)

## 2021-11-17 LAB — MAGNESIUM: Magnesium: 1.7 mg/dL (ref 1.7–2.4)

## 2021-11-17 MED ORDER — LORATADINE 10 MG PO TABS
10.0000 mg | ORAL_TABLET | Freq: Once | ORAL | Status: AC
  Administered 2021-11-17: 10 mg via ORAL
  Filled 2021-11-17: qty 1

## 2021-11-17 MED ORDER — ACETAMINOPHEN 325 MG PO TABS
650.0000 mg | ORAL_TABLET | Freq: Once | ORAL | Status: AC
  Administered 2021-11-17: 650 mg via ORAL
  Filled 2021-11-17: qty 2

## 2021-11-17 MED ORDER — SODIUM CHLORIDE 0.9 % IV SOLN
510.0000 mg | Freq: Once | INTRAVENOUS | Status: AC
  Administered 2021-11-17: 510 mg via INTRAVENOUS
  Filled 2021-11-17: qty 510

## 2021-11-17 MED ORDER — SODIUM CHLORIDE 0.9 % IV SOLN: INTRAVENOUS | Status: DC

## 2021-11-17 MED ORDER — SODIUM CHLORIDE 0.9 % IV SOLN
2400.0000 mg/m2 | INTRAVENOUS | Status: DC
  Administered 2021-11-17: 3950 mg via INTRAVENOUS
  Filled 2021-11-17: qty 79

## 2021-11-17 MED ORDER — PALONOSETRON HCL INJECTION 0.25 MG/5ML
0.2500 mg | Freq: Once | INTRAVENOUS | Status: AC
  Administered 2021-11-17: 0.25 mg via INTRAVENOUS
  Filled 2021-11-17: qty 5

## 2021-11-17 MED ORDER — SODIUM CHLORIDE 0.9 % IV SOLN
400.0000 mg/m2 | Freq: Once | INTRAVENOUS | Status: AC
  Administered 2021-11-17: 656 mg via INTRAVENOUS
  Filled 2021-11-17: qty 32.8

## 2021-11-17 MED ORDER — SODIUM CHLORIDE 0.9 % IV SOLN
125.0000 mg/m2 | Freq: Once | INTRAVENOUS | Status: AC
  Administered 2021-11-17: 200 mg via INTRAVENOUS
  Filled 2021-11-17: qty 10

## 2021-11-17 MED ORDER — DEXTROSE 5 % IV SOLN: Freq: Once | INTRAVENOUS | Status: AC

## 2021-11-17 MED ORDER — SODIUM CHLORIDE 0.9 % IV SOLN
150.0000 mg | Freq: Once | INTRAVENOUS | Status: AC
  Administered 2021-11-17: 150 mg via INTRAVENOUS
  Filled 2021-11-17: qty 150

## 2021-11-17 MED ORDER — SODIUM CHLORIDE 0.9 % IV SOLN
10.0000 mg | Freq: Once | INTRAVENOUS | Status: AC
  Administered 2021-11-17: 10 mg via INTRAVENOUS
  Filled 2021-11-17: qty 10

## 2021-11-17 MED ORDER — OXALIPLATIN CHEMO INJECTION 100 MG/20ML
62.0000 mg/m2 | Freq: Once | INTRAVENOUS | Status: AC
  Administered 2021-11-17: 100 mg via INTRAVENOUS
  Filled 2021-11-17: qty 20

## 2021-11-17 NOTE — Patient Instructions (Signed)
[facility]  Discharge Instructions: Thank you for choosing [facility] to provide your oncology and hematology care.  If you have a lab appointment with the [facility], please come in thru the Main Entrance and check in at the main information desk.  Wear comfortable clothing and clothing appropriate for easy access to any Portacath or PICC line.   We strive to give you quality time with your provider. You may need to reschedule your appointment if you arrive late (15 or more minutes).  Arriving late affects you and other patients whose appointments are after yours.  Also, if you miss three or more appointments without notifying the office, you may be dismissed from the clinic at the provider's discretion.      For prescription refill requests, have your pharmacy contact our office and allow 72 hours for refills to be completed.    Today you received the following chemotherapy and/or immunotherapy agents Feraheme FOLFIRINOX      To help prevent nausea and vomiting after your treatment, we encourage you to take your nausea medication as directed.  BELOW ARE SYMPTOMS THAT SHOULD BE REPORTED IMMEDIATELY: *FEVER GREATER THAN 100.4 F (38 C) OR HIGHER *CHILLS OR SWEATING *NAUSEA AND VOMITING THAT IS NOT CONTROLLED WITH YOUR NAUSEA MEDICATION *UNUSUAL SHORTNESS OF BREATH *UNUSUAL BRUISING OR BLEEDING *URINARY PROBLEMS (pain or burning when urinating, or frequent urination) *BOWEL PROBLEMS (unusual diarrhea, constipation, pain near the anus) TENDERNESS IN MOUTH AND THROAT WITH OR WITHOUT PRESENCE OF ULCERS (sore throat, sores in mouth, or a toothache) UNUSUAL RASH, SWELLING OR PAIN  UNUSUAL VAGINAL DISCHARGE OR ITCHING   Items with * indicate a potential emergency and should be followed up as soon as possible or go to the Emergency Department if any problems should occur.  Please show the CHEMOTHERAPY ALERT CARD or IMMUNOTHERAPY ALERT CARD at check-in to  the Emergency Department and triage nurse.  Should you have questions after your visit or need to cancel or reschedule your appointment, please contact [facility] [phone]  and follow the prompts.  Office hours are 8:00 a.m. to 4:30 p.m. Monday - Friday. Please note that voicemails left after 4:00 p.m. may not be returned until the following business day.  We are closed weekends and major holidays. You have access to a nurse at all times for urgent questions. Please call the main number to the clinic [phone] and follow the prompts.  For any non-urgent questions, you may also contact your provider using MyChart. We now offer e-Visits for anyone 25 and older to request care online for non-urgent symptoms. For details visit [email or web address].   Also download the MyChart app! Go to the app store, search "MyChart", open the app, select [facility], and log in with your MyChart username and password.  Masks are optional in the cancer centers. If you would like for your care team to wear a mask while they are taking care of you, please let them know. You may have one support person who is at least 71 years old accompany you for your appointments.

## 2021-11-17 NOTE — Progress Notes (Signed)
Labs reviewed. Ok to treat per treatment parameters.

## 2021-11-17 NOTE — Progress Notes (Signed)
Patient presents today for Feraheme and Folfirinox infusions with 5FU pump start per providers order.  Vital signs within parameters for treatment.  Labs pending.  Patient has no new complaints at this time.  Treatment given today per MD orders.  Stable during infusion without adverse affects.  5FU pump connected and verified RUN on the screen with the patient.  Vital signs stable.  No complaints at this time.  Discharge from clinic ambulatory in stable condition.  Alert and oriented X 3.  Follow up with [facility] as scheduled.

## 2021-11-17 NOTE — Progress Notes (Signed)
Patients port flushed without difficulty.  Good blood return noted with no bruising or swelling noted at site.  Stable during access and blood draw.  Patient to remain accessed for treatment. 

## 2021-11-19 ENCOUNTER — Other Ambulatory Visit (HOSPITAL_COMMUNITY): Payer: Self-pay | Admitting: Hematology

## 2021-11-19 ENCOUNTER — Inpatient Hospital Stay: Payer: Medicare Other

## 2021-11-19 VITALS — BP 112/68 | HR 67 | Temp 96.1°F | Resp 18

## 2021-11-19 DIAGNOSIS — Z5111 Encounter for antineoplastic chemotherapy: Secondary | ICD-10-CM | POA: Diagnosis not present

## 2021-11-19 DIAGNOSIS — C252 Malignant neoplasm of tail of pancreas: Secondary | ICD-10-CM

## 2021-11-19 MED ORDER — SODIUM CHLORIDE 0.9% FLUSH
10.0000 mL | INTRAVENOUS | Status: DC | PRN
  Administered 2021-11-19: 10 mL

## 2021-11-19 MED ORDER — HEPARIN SOD (PORK) LOCK FLUSH 100 UNIT/ML IV SOLN
500.0000 [IU] | Freq: Once | INTRAVENOUS | Status: AC | PRN
  Administered 2021-11-19: 500 [IU]

## 2021-11-19 MED ORDER — PEGFILGRASTIM-CBQV 6 MG/0.6ML ~~LOC~~ SOSY
6.0000 mg | PREFILLED_SYRINGE | Freq: Once | SUBCUTANEOUS | Status: AC
  Administered 2021-11-19: 6 mg via SUBCUTANEOUS
  Filled 2021-11-19: qty 0.6

## 2021-11-19 NOTE — Patient Instructions (Signed)
[facility]  Discharge Instructions: Thank you for choosing [facility] to provide your oncology and hematology care.  If you have a lab appointment with the [facility], please come in thru the Main Entrance and check in at the main information desk.  Wear comfortable clothing and clothing appropriate for easy access to any Portacath or PICC line.   We strive to give you quality time with your provider. You may need to reschedule your appointment if you arrive late (15 or more minutes).  Arriving late affects you and other patients whose appointments are after yours.  Also, if you miss three or more appointments without notifying the office, you may be dismissed from the clinic at the provider's discretion.      For prescription refill requests, have your pharmacy contact our office and allow 72 hours for refills to be completed.    Today you received the following udenyca injection and chemo pump disconnected with no alarms noted. Port flushed with good blood return.   To help prevent nausea and vomiting after your treatment, we encourage you to take your nausea medication as directed.  BELOW ARE SYMPTOMS THAT SHOULD BE REPORTED IMMEDIATELY: *FEVER GREATER THAN 100.4 F (38 C) OR HIGHER *CHILLS OR SWEATING *NAUSEA AND VOMITING THAT IS NOT CONTROLLED WITH YOUR NAUSEA MEDICATION *UNUSUAL SHORTNESS OF BREATH *UNUSUAL BRUISING OR BLEEDING *URINARY PROBLEMS (pain or burning when urinating, or frequent urination) *BOWEL PROBLEMS (unusual diarrhea, constipation, pain near the anus) TENDERNESS IN MOUTH AND THROAT WITH OR WITHOUT PRESENCE OF ULCERS (sore throat, sores in mouth, or a toothache) UNUSUAL RASH, SWELLING OR PAIN  UNUSUAL VAGINAL DISCHARGE OR ITCHING   Items with * indicate a potential emergency and should be followed up as soon as possible or go to the Emergency Department if any problems should occur.  Please show the CHEMOTHERAPY ALERT CARD or  IMMUNOTHERAPY ALERT CARD at check-in to the Emergency Department and triage nurse.  Should you have questions after your visit or need to cancel or reschedule your appointment, please contact [facility] [phone]  and follow the prompts.  Office hours are 8:00 a.m. to 4:30 p.m. Monday - Friday. Please note that voicemails left after 4:00 p.m. may not be returned until the following business day.  We are closed weekends and major holidays. You have access to a nurse at all times for urgent questions. Please call the main number to the clinic [phone] and follow the prompts.  For any non-urgent questions, you may also contact your provider using MyChart. We now offer e-Visits for anyone 60 and older to request care online for non-urgent symptoms. For details visit [email or web address].   Also download the MyChart app! Go to the app store, search "MyChart", open the app, select [facility], and log in with your MyChart username and password.  Masks are optional in the cancer centers. If you would like for your care team to wear a mask while they are taking care of you, please let them know. You may have one support person who is at least 71 years old accompany you for your appointments.

## 2021-11-19 NOTE — Progress Notes (Signed)
Patient tolerated injection with no complaints voiced. Site clean and dry with no bruising or swelling noted at site. See MAR for details. Band aid applied.  Patient stable during and after injection.  Patient arrived for pump d/c. Chemo pump noted correct infusion of 180mls infused, chemo bag noted to have some overfill in bag. Pharmacist Carolann Littler and Dr. Delton Coombes made aware. Port flushed with good blood return noted. No bruising or swelling at site. Bandaid applied and patient discharged in satisfactory condition. VVS stable with no signs or symptoms of distressed noted.

## 2021-11-22 ENCOUNTER — Other Ambulatory Visit: Payer: Self-pay | Admitting: Hematology

## 2021-11-22 DIAGNOSIS — C252 Malignant neoplasm of tail of pancreas: Secondary | ICD-10-CM

## 2021-11-24 ENCOUNTER — Other Ambulatory Visit: Payer: Self-pay | Admitting: *Deleted

## 2021-11-24 MED ORDER — HYDROCODONE-ACETAMINOPHEN 5-325 MG PO TABS: 1.0000 | ORAL_TABLET | Freq: Three times a day (TID) | ORAL | 0 refills | Status: DC | PRN

## 2021-11-25 ENCOUNTER — Inpatient Hospital Stay: Payer: Medicare Other | Admitting: Hematology

## 2021-11-25 ENCOUNTER — Inpatient Hospital Stay: Payer: Medicare Other

## 2021-11-27 ENCOUNTER — Inpatient Hospital Stay: Payer: Medicare Other

## 2021-11-30 NOTE — Progress Notes (Unsigned)
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   CURRENT THERAPY: FOLFIRINOX every 2 weeks  BRIEF ONCOLOGIC HISTORY:  Oncology History  Pancreatic cancer ([place])  10/07/2021 Initial Diagnosis   Pancreatic cancer ([place])   10/13/2021 - 11/19/2021 Chemotherapy   Patient is on Treatment Plan : PANCREAS Modified FOLFIRINOX q14d x 4 cycles     10/13/2021 -  Chemotherapy   Patient is on Treatment Plan : PANCREAS Modified FOLFIRINOX q14d x 4 cycles       CANCER STAGING:  Cancer Staging  Adenocarcinoma of colon with mucinous features Staging form: Colon and Rectum, AJCC 7th Edition - Clinical: Stage IIIB (T3, N1, M0) - Signed by Baird Cancer, PA on 09/22/2010  Pancreatic cancer [facility]) Staging form: Exocrine Pancreas, AJCC 8th Edition - Clinical stage from 10/07/2021: Stage IIB (cT1, cN1, cM0) - Unsigned    INTERVAL HISTORY:  [name] 71 y.o. male seen for follow-up of pancreatic cancer.  He has received cycle 3 of FOLFIRINOX on 8/8292023.  He reported feeling very tired.  He also had nausea and diarrhea but no vomiting.  He lost ****9 pounds.  Yesterday the first day he felt better.   REVIEW OF SYSTEMS:  Review of Systems  Gastrointestinal:  Positive for diarrhea and nausea.  Neurological:  Positive for dizziness.  All other systems reviewed and are negative.    PAST MEDICAL/SURGICAL HISTORY:  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   Stage 3   Anemia    Anxiety    Arthritis    Barrett's esophagus    Blood transfusion    Bowel obstruction ([place]) 05/13/2012   Recurrent   Bronchitis    Chest pain at rest    Chronic abdominal pain    Erosive esophagitis    ETOH abuse    quit 03/2010    GERD (gastroesophageal reflux disease)    Hx of Clostridium difficile infection 01/2012   Hypertension    Ileus ([place])    Iron deficiency anemia 03/23/2016   Lung cancer ([place])    Obstruction of bowel ([place]) 03/03/2014   Osteoporosis    Personal history of PE (pulmonary embolism) 10/01/2010   Pneumonia    Pulmonary embolism ([place]) 02/2010   Recurrent upper respiratory infection (URI)    Renal disorder    S/P endoscopy 09/28/2010   erosive reflux esophagitis, Billroth I anatomy   S/P partial gastrectomy 1980s   Seizures ([place])    was on meds for 6 months, Unknown etiology, last seizure was 2017.   Shortness of breath    TIA (transient ischemic attack) 12/2009   Vitamin B12 deficiency    Past Surgical History:  Procedure Laterality Date   ABDOMINAL ADHESION SURGERY  03/04/15   @ [facility]   ABDOMINAL EXPLORATION SURGERY     abdominal sugery     for bowel obstruction x 8, all in 1980s, except for one in 07/2010   APPENDECTOMY  1980s   Billroth 1 hemigastrectomy  1980s   per patient for benign duodenal tumor   CARDIAC CATHETERIZATION  07/17/2012   CHOLECYSTECTOMY  1980s   COLON SURGERY  May 2012   left hemicolectomy, colon cancer found at time of surgery for bowel obstruction   COLON  SURGERY  10/12/2018   At [facility]: Resection of right colon and terminal ileum with creation of ileorectal anastomosis for large bowel obstruction.  Cecum and ascending colon noted to be attached to the rectum.   COLONOSCOPY  03/18/2011   anastomosis at 35cm. Several adenomatous polyps removed. Sigmoid diverticulosis. Next TCS 02/2013   COLONOSCOPY N/A 07/24/2012   YQI:[id number] post segmental resection with normal-appearing colonic anastomosis aside from an adjacent polyp-removed as described above. Rectal polyp-removed as described above. CT findings appear to have been artifactual. tubular adenomas/prolapsed type polyp.   COLONOSCOPY N/A 05/15/2015   Procedure: COLONOSCOPY;  Surgeon: [name], MD;  Location: [facility]  ENDO SUITE;  Service: Endoscopy;  Laterality: N/A;   COLONOSCOPY  09/26/2018   at [facility]: prep not adequate for colon cancer surveillance.  Prior end-to-end colonic anastomosis in the rectosigmoid region.  This was patent and characterized by healthy-appearing mucosa.  Anastomosis was traversed.  Normal terminal ileum.   COLONOSCOPY WITH PROPOFOL N/A 11/04/2016   Dr. [name], status post subtotal colectomy with normal-appearing residual lower GI tract.  Next colonoscopy in 5 years.   ESOPHAGOGASTRODUODENOSCOPY  09/28/2010   ESOPHAGOGASTRODUODENOSCOPY  12/01/2010   Cervical web status post dilation, erosive esophagitis, B1 hemigastrectomy, inflamed anastomosis   ESOPHAGOGASTRODUODENOSCOPY  04/16/2011   excoriation at [facility] c/w trauma/M-W tear, friable gastric anastomosis, dilation efferent limb   ESOPHAGOGASTRODUODENOSCOPY N/A 06/03/2014   Dr.[name]- cervcal esopphageal web s/p dilation. abnormal distal esophagus bx= barretts esophagus   ESOPHAGOGASTRODUODENOSCOPY N/A 05/15/2015   Procedure: ESOPHAGOGASTRODUODENOSCOPY (EGD);  Surgeon: [name], MD;  Location: [facility] ENDO SUITE;  Service: Endoscopy;  Laterality: N/A;  230   ESOPHAGOGASTRODUODENOSCOPY (EGD) WITH ESOPHAGEAL DILATION  02/25/2012   ZDG:[id number] esophageal web-s/p dilation anddisruption as described above. Status post prior gastric with Billroth I configuration. Abnormal gastric mucosa at the anastomosis. Gastric biopsy showed mild chronic inflammation but no H. pylori    ESOPHAGOGASTRODUODENOSCOPY (EGD) WITH PROPOFOL N/A 11/04/2016   Dr. [name]: Reflux esophagitis, small hiatal hernia status post hemigastrectomy.  Single patent efferent small bowel limb appeared normal, 2 x 2 centimeter tongue of salmon epithelium again seen, esophageal dilation.  Biopsies consistent with reflux changes, not Barrett's however this was confirmed on prior EGDs.  Offer 3-year follow-up EGD August 2021.   ESOPHAGOGASTRODUODENOSCOPY (EGD) WITH PROPOFOL N/A 03/03/2020    Procedure: ESOPHAGOGASTRODUODENOSCOPY (EGD) WITH PROPOFOL;  Surgeon: [name], MD;  Location[facility];  Service: Endoscopy;  Laterality: N/A;  3:00pm   HERNIA REPAIR     right inguinal   MALONEY DILATION N/A 06/03/2014   Procedure: [name] DILATION;  Surgeon: [name], MD;  Location: [facility];  Service: Endoscopy;  Laterality: N/A;   MALONEY DILATION N/A 05/15/2015   Procedure: [name] DILATION;  Surgeon: [name], MD;  Location: [facility];  Service: Endoscopy;  Laterality: N/A;   MALONEY DILATION N/A 11/04/2016   Procedure: [name] DILATION;  Surgeon: [name], MD;  Location: [facility];  Service: Endoscopy;  Laterality: N/A;   MALONEY DILATION N/A 03/03/2020   Procedure: [name] DILATION;  Surgeon: [name], MD;  Location: [facility];  Service: Endoscopy;  Laterality: N/A;   PORTACATH PLACEMENT     SAVORY DILATION N/A 06/03/2014   Procedure: SAVORY DILATION;  Surgeon: [name], MD;  Location: [facility];  Service: Endoscopy;  Laterality: N/A;     SOCIAL HISTORY:  Social History   Socioeconomic History   Marital status: Married    Spouse name:  Not on file   Number of children: 3   Years of education: Not on file   Highest education level: Not on file  Occupational History    Employer: [employer]  Tobacco Use   Smoking status: Former    Packs/day: 0.50    Years: 40.00    Total pack years: 20.00    Types: Cigarettes    Quit date: 12/20/2012    Years since quitting: 8.9   Smokeless tobacco: Never  Vaping Use   Vaping Use: Never used  Substance and Sexual Activity   Alcohol use: No   Drug use: No   Sexual activity: Never  Other Topics Concern   Not on file  Social History Narrative   Not on file   Social Determinants of Health   Financial Resource Strain: Not on file  Food Insecurity: Not on file  Transportation Needs: Not on file  Physical Activity: Not on file  Stress: Not on file  Social Connections:  Not on file  Intimate Partner Violence: Not on file    FAMILY HISTORY:  Family History  Problem Relation Age of Onset   Hypertension Mother    Arthritis Mother    Pneumonia Mother    Hypertension Father    Heart attack Father    Colon cancer Neg Hx     CURRENT MEDICATIONS:  Outpatient Encounter Medications as of 12/01/2021  Medication Sig   ammonium lactate (AMLACTIN) 12 % cream Apply 1 g topically See admin instructions. APPLY CREAM TO DRY SKIN ON LEGS ARMS HANDS AND BACK AREA ONCE TO TWICE DAILY (AVOID FACE GROIN UNDERARMS)   apixaban (ELIQUIS) 5 MG TABS tablet Take 1 tablet (5 mg total) by mouth 2 (two) times daily.   atorvastatin (LIPITOR) 20 MG tablet Take by mouth.   Bacillus Coagulans-Inulin (PROBIOTIC) 1-250 BILLION-MG CAPS Take by mouth.   baclofen (LIORESAL) 10 MG tablet as needed.   carvedilol (COREG) 25 MG tablet Take 0.5 tablets (12.5 mg total) by mouth 2 (two) times daily with a meal. (Patient taking differently: Take 25 mg by mouth 2 (two) times daily with a meal. Takes 25 mg 2 times daily.)   Cholecalciferol (VITAMIN D) 2000 units CAPS Take 2,000 Units by mouth daily.   Cholecalciferol (VITAMIN D3) 10 MCG (400 UNIT) tablet Take by mouth.   CREON 36000-114000 units CPEP capsule Take by mouth.   cyanocobalamin 100 MCG tablet Take by mouth.   diclofenac Sodium (VOLTAREN) 1 % GEL Apply topically.   diphenoxylate-atropine (LOMOTIL) 2.5-0.025 MG tablet TAKE 1 TABLET BY MOUTH 4 TIMES A DAY   ferrous sulfate 325 (65 FE) MG EC tablet Take by mouth.   gabapentin (NEURONTIN) 100 MG capsule TAKE 1 CAPSULE BY MOUTH EVERY DAY AT BEDTIME   HYDROcodone-acetaminophen (NORCO/VICODIN) 5-325 MG tablet Take 1 tablet by mouth every 8 (eight) hours as needed for moderate pain.   loperamide (IMODIUM A-D) 2 MG tablet Take 2 mg by mouth in the morning and at bedtime.   magnesium oxide (MAG-OX) 400 (240 Mg) MG tablet Take 1 tablet (400 mg total) by mouth 2 (two) times daily.   mirtazapine  (REMERON) 30 MG tablet TAKE 1 TABLET BY MOUTH AT BEDTIME   Multiple Vitamin (MULTIVITAMIN WITH MINERALS) TABS tablet Take 1 tablet by mouth daily.   ondansetron (ZOFRAN-ODT) 4 MG disintegrating tablet TAKE 1 TABLET BY MOUTH EVERY 4 HOURS AS NEEDED FOR NAUSEA AND VOMITING   pantoprazole (PROTONIX) 40 MG tablet Take 1 tablet (40 mg total) by  mouth 2 (two) times daily before a meal.   PREVALITE 4 g packet Take 1 packet (4 g total) by mouth daily. DO NOT TAKE WITHIN 2 HOURS OF OTHER MEDICATIONS   prochlorperazine (COMPAZINE) 10 MG tablet Take by mouth.   sucralfate (CARAFATE) 1 GM/10ML suspension TAKE 10 MLS (1 G TOTAL) BY MOUTH 4 (FOUR) TIMES DAILY AS NEEDED. FOR BREAKTHROUGH HEARTBURN   traMADol (ULTRAM) 50 MG tablet Take 1 tablet by mouth every 6 (six) hours as needed.   [DISCONTINUED] lidocaine-prilocaine (EMLA) cream Apply to port site 1 hour prior to port access   Facility-Administered Encounter Medications as of 12/01/2021  Medication   acetaminophen (TYLENOL) 325 MG tablet   heparin lock flush 100 unit/mL   loratadine (CLARITIN) 10 MG tablet   sodium chloride flush (NS) 0.9 % injection 10 mL    ALLERGIES:  No Known Allergies   PHYSICAL EXAM:  ECOG Performance status: 1  There were no vitals filed for this visit. There were no vitals filed for this visit. Physical Exam Vitals reviewed.  Constitutional:      Appearance: Normal appearance.  Cardiovascular:     Rate and Rhythm: Normal rate and regular rhythm.     Heart sounds: Normal heart sounds.  Pulmonary:     Breath sounds: Normal breath sounds.  Abdominal:     Palpations: Abdomen is soft. There is no mass.  Neurological:     General: No focal deficit present.     Mental Status: He is alert and oriented to person, place, and time.  Psychiatric:        Mood and Affect: Mood normal.        Behavior: Behavior normal.      LABORATORY DATA:  I have reviewed the labs as listed.  CBC    Component Value Date/Time   WBC  26.2 (H) 11/17/2021 0807   RBC 3.48 (L) 11/17/2021 0807   HGB 10.5 (L) 11/17/2021 0807   HCT 31.6 (L) 11/17/2021 0807   PLT 186 11/17/2021 0807   MCV 90.8 11/17/2021 0807   MCH 30.2 11/17/2021 0807   MCHC 33.2 11/17/2021 0807   RDW 14.9 11/17/2021 0807   LYMPHSABS 3.7 11/17/2021 0807   MONOABS 1.0 11/17/2021 0807   EOSABS 0.0 11/17/2021 0807   BASOSABS 0.0 11/17/2021 0807      Latest Ref Rng & Units 11/17/2021    8:07 AM 11/11/2021    8:49 AM 10/27/2021    8:16 AM  CMP  Glucose 70 - 99 mg/dL 97  95  124   BUN 8 - 23 mg/dL 13  17  12    Creatinine 0.61 - 1.24 mg/dL 1.21  1.91  1.48   Sodium 135 - 145 mmol/L 136  135  133   Potassium 3.5 - 5.1 mmol/L 4.3  3.6  4.0   Chloride 98 - 111 mmol/L 111  108  108   CO2 22 - 32 mmol/L 21  20  21    Calcium 8.9 - 10.3 mg/dL 7.6  7.8  7.9   Total Protein 6.5 - 8.1 g/dL 5.2  5.5  5.6   Total Bilirubin 0.3 - 1.2 mg/dL 0.6  0.5  0.4   Alkaline Phos 38 - 126 U/L 103  108  65   AST 15 - 41 U/L 18  16  11    ALT 0 - 44 U/L 13  12  10      DIAGNOSTIC IMAGING:  I have independently reviewed the scans and discussed with the  patient.  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  No lymphadenopathy. - EUS and FNA (09/21/2021): Oval mass in the pancreatic tail, hypoechoic, 20 mm x 17 mm.  Remainder of the pancreas shows decreased visualization of the pancreas. - Pathology: Invasive adenocarcinoma, ductal type, moderately to poorly differentiated.  Background desmoplastic stroma, blood and pancreatic tissue.  Morphologically distinct from prior lung adenocarcinoma.  TTF-1 and Napsin A were negative. - Patient evaluated by Dr. Dayton Scrape at [facility] for pancreatic resection. - CA 19-9 (10/02/2021): 6.47 - PET CT scan (10/02/2021): Ill-defined 4 cm mass in the left upper quadrant in the region of the pancreatic tail with mild  hypermetabolism, SUV 4.7.  Soft tissue nodule in the gastrohepatic ligament measuring 2 x 1.3 cm, SUV 4.0.  Suspicious for metastatic lymphadenopathy.  9 mm mediastinal lymph node and AP window is hypermetabolic SUV 4.9.  Borderline enlarged left hilar lymph nodes show hypermetabolic activity SUV 3.4.  Previously seen subpleural nodule in the superior right lower lobe has nearly completely resolved.  Overall mild hypermetabolic mediastinal and left hilar lymph nodes which are nonspecific.  May be reactive in etiology although metastatic disease cannot definitely be excluded.        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 -Last EGD/colonoscopy on 05/15/2015 was normal. - CT scan in October 2018 was negative for metastatic disease. 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  Margins negative.  2 hilar lymph nodes were negative. - No adjuvant chemotherapy was needed.    Social/family history: - Lives at home with his wife.  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  No chemical exposure.  Quit smoking in 2014 and smoked 1 pack/day for 44 years. - Paternal cousin has bone cancer.    PLAN:   Stage IIB adenocarcinoma of the pancreatic tail: - Echo 2 of FOLFIRINOX on 10/27/2021. - He had a rough time after the last treatment**.  Mostly in terms of fatigue.  Yesterday started to feel better. - Reviewed labs which showed creatinine increased to 1.91 from baseline of 1.4-1.5.  Magnesium is low and potassium is borderline.  Recommend 1 L of fluid with electrolytes today. - Given his weight loss and  fatigue, I have recommended holding treatment today.  He will come back next week and repeat labs and possibly do treatment. - RTC 3 weeks for follow-up prior to cycle 6.  2.  Abdominal pain: - Left upper quadrant pain is stable.   3. Iron deficiency: - Ferritin was 92 and percent saturation 26.  Hemoglobin 10.7.  ***Recommend Feraheme weekly x2.  He will receive first infusion today.   4. Peripheral neuropathy: - Occasional numbness in the toes has been stable.  Continue gabapentin at nighttime.  5.  Weight loss: - He lost 9 pounds ***in the last 1 month.  I will hold his treatment today.  Encouraged oral intake.  6.  Chronic diarrhea: - This is stable since surgery.  Continue Imodium and Lomotil as needed.  Orders placed this encounter:  No orders of the defined types were placed in this encounter.     Derek Jack, MD [place] [phone]

## 2021-12-01 ENCOUNTER — Inpatient Hospital Stay: Payer: Medicare Other

## 2021-12-01 ENCOUNTER — Inpatient Hospital Stay (HOSPITAL_BASED_OUTPATIENT_CLINIC_OR_DEPARTMENT_OTHER): Payer: Medicare Other | Admitting: Nurse Practitioner

## 2021-12-01 ENCOUNTER — Inpatient Hospital Stay: Payer: Medicare Other | Attending: Hematology

## 2021-12-01 VITALS — BP 98/64 | HR 83 | Temp 96.8°F | Resp 18

## 2021-12-01 DIAGNOSIS — C252 Malignant neoplasm of tail of pancreas: Secondary | ICD-10-CM | POA: Insufficient documentation

## 2021-12-01 DIAGNOSIS — C189 Malignant neoplasm of colon, unspecified: Secondary | ICD-10-CM

## 2021-12-01 DIAGNOSIS — R634 Abnormal weight loss: Secondary | ICD-10-CM

## 2021-12-01 DIAGNOSIS — Z5111 Encounter for antineoplastic chemotherapy: Secondary | ICD-10-CM | POA: Insufficient documentation

## 2021-12-01 DIAGNOSIS — Z95828 Presence of other vascular implants and grafts: Secondary | ICD-10-CM | POA: Diagnosis not present

## 2021-12-01 DIAGNOSIS — Z79899 Other long term (current) drug therapy: Secondary | ICD-10-CM | POA: Insufficient documentation

## 2021-12-01 DIAGNOSIS — C259 Malignant neoplasm of pancreas, unspecified: Secondary | ICD-10-CM

## 2021-12-01 LAB — CBC WITH DIFFERENTIAL/PLATELET
Abs Immature Granulocytes: 0.7 10*3/uL — ABNORMAL HIGH (ref 0.00–0.07)
Basophils Absolute: 0 10*3/uL (ref 0.0–0.1)
Basophils Relative: 0 %
Eosinophils Absolute: 0 10*3/uL (ref 0.0–0.5)
Eosinophils Relative: 0 %
HCT: 30.3 % — ABNORMAL LOW (ref 39.0–52.0)
Hemoglobin: 10 g/dL — ABNORMAL LOW (ref 13.0–17.0)
Lymphocytes Relative: 7 %
Lymphs Abs: 2.4 10*3/uL (ref 0.7–4.0)
MCH: 30.6 pg (ref 26.0–34.0)
MCHC: 33 g/dL (ref 30.0–36.0)
MCV: 92.7 fL (ref 80.0–100.0)
Metamyelocytes Relative: 2 %
Monocytes Absolute: 2.7 10*3/uL — ABNORMAL HIGH (ref 0.1–1.0)
Monocytes Relative: 8 %
Neutro Abs: 28.4 10*3/uL — ABNORMAL HIGH (ref 1.7–7.7)
Neutrophils Relative %: 83 %
Platelets: 167 10*3/uL (ref 150–400)
RBC: 3.27 MIL/uL — ABNORMAL LOW (ref 4.22–5.81)
RDW: 17.7 % — ABNORMAL HIGH (ref 11.5–15.5)
WBC: 34.2 10*3/uL — ABNORMAL HIGH (ref 4.0–10.5)
nRBC: 0.1 % (ref 0.0–0.2)

## 2021-12-01 LAB — COMPREHENSIVE METABOLIC PANEL
ALT: 14 U/L (ref 0–44)
AST: 14 U/L — ABNORMAL LOW (ref 15–41)
Albumin: 2.7 g/dL — ABNORMAL LOW (ref 3.5–5.0)
Alkaline Phosphatase: 158 U/L — ABNORMAL HIGH (ref 38–126)
Anion gap: 7 (ref 5–15)
BUN: 23 mg/dL (ref 8–23)
CO2: 21 mmol/L — ABNORMAL LOW (ref 22–32)
Calcium: 7.4 mg/dL — ABNORMAL LOW (ref 8.9–10.3)
Chloride: 108 mmol/L (ref 98–111)
Creatinine, Ser: 1.46 mg/dL — ABNORMAL HIGH (ref 0.61–1.24)
GFR, Estimated: 51 mL/min — ABNORMAL LOW (ref >60)
Glucose, Bld: 92 mg/dL (ref 70–99)
Potassium: 4.1 mmol/L (ref 3.5–5.1)
Sodium: 136 mmol/L (ref 135–145)
Total Bilirubin: 0.4 mg/dL (ref 0.3–1.2)
Total Protein: 5.6 g/dL — ABNORMAL LOW (ref 6.5–8.1)

## 2021-12-01 LAB — MAGNESIUM: Magnesium: 1.7 mg/dL (ref 1.7–2.4)

## 2021-12-01 MED ORDER — SODIUM CHLORIDE 0.9 % IV SOLN
150.0000 mg | Freq: Once | INTRAVENOUS | Status: AC
  Administered 2021-12-01: 150 mg via INTRAVENOUS
  Filled 2021-12-01: qty 5

## 2021-12-01 MED ORDER — SODIUM CHLORIDE 0.9 % IV SOLN
10.0000 mg | Freq: Once | INTRAVENOUS | Status: AC
  Administered 2021-12-01: 10 mg via INTRAVENOUS
  Filled 2021-12-01: qty 1

## 2021-12-01 MED ORDER — SODIUM CHLORIDE 0.9 % IV SOLN
400.0000 mg/m2 | Freq: Once | INTRAVENOUS | Status: AC
  Administered 2021-12-01: 644 mg via INTRAVENOUS
  Filled 2021-12-01: qty 32.2

## 2021-12-01 MED ORDER — ATROPINE SULFATE 1 MG/ML IV SOLN
0.5000 mg | Freq: Once | INTRAVENOUS | Status: AC | PRN
  Administered 2021-12-01: 0.5 mg via INTRAVENOUS
  Filled 2021-12-01: qty 1

## 2021-12-01 MED ORDER — SODIUM CHLORIDE 0.9 % IV SOLN
2400.0000 mg/m2 | INTRAVENOUS | Status: DC
  Administered 2021-12-01: 3850 mg via INTRAVENOUS
  Filled 2021-12-01: qty 77

## 2021-12-01 MED ORDER — OXALIPLATIN CHEMO INJECTION 100 MG/20ML
62.0000 mg/m2 | Freq: Once | INTRAVENOUS | Status: AC
  Administered 2021-12-01: 100 mg via INTRAVENOUS
  Filled 2021-12-01: qty 20

## 2021-12-01 MED ORDER — PALONOSETRON HCL INJECTION 0.25 MG/5ML
0.2500 mg | Freq: Once | INTRAVENOUS | Status: AC
  Administered 2021-12-01: 0.25 mg via INTRAVENOUS
  Filled 2021-12-01: qty 5

## 2021-12-01 MED ORDER — DEXTROSE 5 % IV SOLN: Freq: Once | INTRAVENOUS | Status: AC

## 2021-12-01 MED ORDER — SODIUM CHLORIDE 0.9 % IV SOLN
125.0000 mg/m2 | Freq: Once | INTRAVENOUS | Status: AC
  Administered 2021-12-01: 200 mg via INTRAVENOUS
  Filled 2021-12-01: qty 10

## 2021-12-01 NOTE — [place] Notes (Signed)
Dose for today for irinotecan confirmed to be 125 mg/m2  T.O. Burns Spain, NP/Areliz Rothman Ronnald Ramp, PharmD

## 2021-12-01 NOTE — Progress Notes (Signed)
Patients port flushed without difficulty.  Good blood return noted with no bruising or swelling noted at site.  Stable during access and blood draw.  Patient to remain accessed for treatment. 

## 2021-12-01 NOTE — [place] Notes (Signed)
Patient presents today for chemotherapy infusion.  Patient is in satisfactory condition with no complaints voiced.  Vital signs are stable.  Labs reviewed by Burns Spain, NP during his office visit.  All labs are within treatment parameters. We will proceed with treatment per providers orders.   Patient tolerated treatment well with no complaints voiced.  Home infusion 5FU pump connected.  Patient verbalized understanding.  Patient left ambulatory in stable condition.  Vital signs stable at discharge.  Follow up as scheduled.

## 2021-12-01 NOTE — Patient Instructions (Signed)
[facility] [facility]  Discharge Instructions: Thank you for choosing [facility] to provide your oncology and hematology care.  If you have a lab appointment with the [facility], please come in thru the Main Entrance and check in at the main information desk.  Wear comfortable clothing and clothing appropriate for easy access to any Portacath or PICC line.   We strive to give you quality time with your provider. You may need to reschedule your appointment if you arrive late (15 or more minutes).  Arriving late affects you and other patients whose appointments are after yours.  Also, if you miss three or more appointments without notifying the office, you may be dismissed from the clinic at the provider's discretion.      For prescription refill requests, have your pharmacy contact our office and allow 72 hours for refills to be completed.    Today you received the following chemotherapy and/or immunotherapy agents Fluorouracil/Leucovorin/Oxaliplatin/Irinotecan.       To help prevent nausea and vomiting after your treatment, we encourage you to take your nausea medication as directed.  BELOW ARE SYMPTOMS THAT SHOULD BE REPORTED IMMEDIATELY: *FEVER GREATER THAN 100.4 F (38 C) OR HIGHER *CHILLS OR SWEATING *NAUSEA AND VOMITING THAT IS NOT CONTROLLED WITH YOUR NAUSEA MEDICATION *UNUSUAL SHORTNESS OF BREATH *UNUSUAL BRUISING OR BLEEDING *URINARY PROBLEMS (pain or burning when urinating, or frequent urination) *BOWEL PROBLEMS (unusual diarrhea, constipation, pain near the anus) TENDERNESS IN MOUTH AND THROAT WITH OR WITHOUT PRESENCE OF ULCERS (sore throat, sores in mouth, or a toothache) UNUSUAL RASH, SWELLING OR PAIN  UNUSUAL VAGINAL DISCHARGE OR ITCHING   Items with * indicate a potential emergency and should be followed up as soon as possible or go to the Emergency Department if any problems should occur.  Please show the CHEMOTHERAPY ALERT CARD or  IMMUNOTHERAPY ALERT CARD at check-in to the Emergency Department and triage nurse.  Should you have questions after your visit or need to cancel or reschedule your appointment, please contact [facility] [phone]  and follow the prompts.  Office hours are 8:00 a.m. to 4:30 p.m. Monday - Friday. Please note that voicemails left after 4:00 p.m. may not be returned until the following business day.  We are closed weekends and major holidays. You have access to a nurse at all times for urgent questions. Please call the main number to the clinic [phone] and follow the prompts.  For any non-urgent questions, you may also contact your provider using MyChart. We now offer e-Visits for anyone 46 and older to request care online for non-urgent symptoms. For details visit [email or web address].   Also download the MyChart app! Go to the app store, search "MyChart", open the app, select [facility], and log in with your MyChart username and password.  Masks are optional in the cancer centers. If you would like for your care team to wear a mask while they are taking care of you, please let them know. You may have one support person who is at least 71 years old accompany you for your appointments.

## 2021-12-03 ENCOUNTER — Other Ambulatory Visit: Payer: Medicare Other

## 2021-12-03 ENCOUNTER — Inpatient Hospital Stay: Payer: Medicare Other

## 2021-12-03 VITALS — BP 88/60 | HR 80 | Temp 96.7°F | Resp 18

## 2021-12-03 DIAGNOSIS — Z5111 Encounter for antineoplastic chemotherapy: Secondary | ICD-10-CM | POA: Diagnosis not present

## 2021-12-03 DIAGNOSIS — C252 Malignant neoplasm of tail of pancreas: Secondary | ICD-10-CM

## 2021-12-03 MED ORDER — PEGFILGRASTIM-CBQV 6 MG/0.6ML ~~LOC~~ SOSY
6.0000 mg | PREFILLED_SYRINGE | Freq: Once | SUBCUTANEOUS | Status: AC
  Administered 2021-12-03: 6 mg via SUBCUTANEOUS
  Filled 2021-12-03: qty 0.6

## 2021-12-03 MED ORDER — SODIUM CHLORIDE 0.9% FLUSH
10.0000 mL | INTRAVENOUS | Status: DC | PRN
  Administered 2021-12-03: 10 mL

## 2021-12-03 MED ORDER — HEPARIN SOD (PORK) LOCK FLUSH 100 UNIT/ML IV SOLN
500.0000 [IU] | Freq: Once | INTRAVENOUS | Status: AC | PRN
  Administered 2021-12-03: 500 [IU]

## 2021-12-03 NOTE — [place] Notes (Signed)
Patient tolerated Udenyca injection with no complaints voiced.  Site clean and dry with no bruising or swelling noted.  No complaints of pain.  Home infusion 5FU pump disconnected.  Port flushed without difficulty and good blood return noted.  Discharged with vital signs stable and no signs or symptoms of distress noted.

## 2021-12-03 NOTE — Patient Instructions (Signed)
[facility]  Discharge Instructions: Thank you for choosing [facility] to provide your oncology and hematology care.  If you have a lab appointment with the Cancer Center, please come in thru the Main Entrance and check in at the main information desk.  Wear comfortable clothing and clothing appropriate for easy access to any Portacath or PICC line.   We strive to give you quality time with your provider. You may need to reschedule your appointment if you arrive late (15 or more minutes).  Arriving late affects you and other patients whose appointments are after yours.  Also, if you miss three or more appointments without notifying the office, you may be dismissed from the clinic at the provider's discretion.      For prescription refill requests, have your pharmacy contact our office and allow 72 hours for refills to be completed.     To help prevent nausea and vomiting after your treatment, we encourage you to take your nausea medication as directed.  BELOW ARE SYMPTOMS THAT SHOULD BE REPORTED IMMEDIATELY: *FEVER GREATER THAN 100.4 F (38 C) OR HIGHER *CHILLS OR SWEATING *NAUSEA AND VOMITING THAT IS NOT CONTROLLED WITH YOUR NAUSEA MEDICATION *UNUSUAL SHORTNESS OF BREATH *UNUSUAL BRUISING OR BLEEDING *URINARY PROBLEMS (pain or burning when urinating, or frequent urination) *BOWEL PROBLEMS (unusual diarrhea, constipation, pain near the anus) TENDERNESS IN MOUTH AND THROAT WITH OR WITHOUT PRESENCE OF ULCERS (sore throat, sores in mouth, or a toothache) UNUSUAL RASH, SWELLING OR PAIN  UNUSUAL VAGINAL DISCHARGE OR ITCHING   Items with * indicate a potential emergency and should be followed up as soon as possible or go to the Emergency Department if any problems should occur.  Please show the CHEMOTHERAPY ALERT CARD or IMMUNOTHERAPY ALERT CARD at check-in to the Emergency Department and triage nurse.  Should you have questions after your visit or need to  cancel or reschedule your appointment, please contact [facility] [phone]  and follow the prompts.  Office hours are 8:00 a.m. to 4:30 p.m. Monday - Friday. Please note that voicemails left after 4:00 p.m. may not be returned until the following business day.  We are closed weekends and major holidays. You have access to a nurse at all times for urgent questions. Please call the main number to the clinic [phone] and follow the prompts.  For any non-urgent questions, you may also contact your provider using MyChart. We now offer e-Visits for anyone 18 and older to request care online for non-urgent symptoms. For details visit [email or web address].   Also download the MyChart app! Go to the app store, search "MyChart", open the app, select [facility], and log in with your MyChart username and password.  Masks are optional in the cancer centers. If you would like for your care team to wear a mask while they are taking care of you, please let them know. You may have one support person who is at least 71 years old accompany you for your appointments.  

## 2021-12-13 ENCOUNTER — Telehealth: Payer: Self-pay | Admitting: Gastroenterology

## 2021-12-13 NOTE — Telephone Encounter (Signed)
Please NIC for ov with [name] in six months.   Reviewed records since ov here in 03/2021. Seen down at [facility]. Patient has been diagnosed with 3rd primary malignancy since his last OV with [place]. He is currently being treated for pancreatic tail malignancy. He is due for flex sig for surveillance of rectal mucosa at any time and EGD next year for Barrett's. Given active treatment for pancreatic malignancy, we will hold off on flex sig for now. Arranging for follow up visit in six months.

## 2021-12-15 ENCOUNTER — Ambulatory Visit: Payer: Medicare Other | Admitting: Hematology

## 2021-12-15 ENCOUNTER — Other Ambulatory Visit: Payer: Medicare Other

## 2021-12-15 ENCOUNTER — Ambulatory Visit: Payer: Medicare Other

## 2021-12-16 ENCOUNTER — Inpatient Hospital Stay (HOSPITAL_BASED_OUTPATIENT_CLINIC_OR_DEPARTMENT_OTHER): Payer: Medicare Other | Admitting: Hematology

## 2021-12-16 ENCOUNTER — Inpatient Hospital Stay: Payer: Medicare Other

## 2021-12-16 VITALS — BP 90/60 | HR 72 | Temp 96.7°F | Resp 18

## 2021-12-16 DIAGNOSIS — C259 Malignant neoplasm of pancreas, unspecified: Secondary | ICD-10-CM

## 2021-12-16 DIAGNOSIS — C252 Malignant neoplasm of tail of pancreas: Secondary | ICD-10-CM

## 2021-12-16 DIAGNOSIS — Z5111 Encounter for antineoplastic chemotherapy: Secondary | ICD-10-CM | POA: Diagnosis not present

## 2021-12-16 DIAGNOSIS — C189 Malignant neoplasm of colon, unspecified: Secondary | ICD-10-CM

## 2021-12-16 LAB — FERRITIN: Ferritin: 958 ng/mL — ABNORMAL HIGH (ref 24–336)

## 2021-12-16 LAB — CBC WITH DIFFERENTIAL/PLATELET
Abs Immature Granulocytes: 1.13 10*3/uL — ABNORMAL HIGH (ref 0.00–0.07)
Basophils Absolute: 0.1 10*3/uL (ref 0.0–0.1)
Basophils Relative: 0 %
Eosinophils Absolute: 0.1 10*3/uL (ref 0.0–0.5)
Eosinophils Relative: 0 %
HCT: 25 % — ABNORMAL LOW (ref 39.0–52.0)
Hemoglobin: 8.3 g/dL — ABNORMAL LOW (ref 13.0–17.0)
Immature Granulocytes: 3 %
Lymphocytes Relative: 9 %
Lymphs Abs: 3 10*3/uL (ref 0.7–4.0)
MCH: 31.6 pg (ref 26.0–34.0)
MCHC: 33.2 g/dL (ref 30.0–36.0)
MCV: 95.1 fL (ref 80.0–100.0)
Monocytes Absolute: 1.7 10*3/uL — ABNORMAL HIGH (ref 0.1–1.0)
Monocytes Relative: 5 %
Neutro Abs: 29.2 10*3/uL — ABNORMAL HIGH (ref 1.7–7.7)
Neutrophils Relative %: 83 %
Platelets: 139 10*3/uL — ABNORMAL LOW (ref 150–400)
RBC: 2.63 MIL/uL — ABNORMAL LOW (ref 4.22–5.81)
RDW: 20.3 % — ABNORMAL HIGH (ref 11.5–15.5)
WBC: 35.1 10*3/uL — ABNORMAL HIGH (ref 4.0–10.5)
nRBC: 0 % (ref 0.0–0.2)

## 2021-12-16 LAB — COMPREHENSIVE METABOLIC PANEL
ALT: 13 U/L (ref 0–44)
AST: 18 U/L (ref 15–41)
Albumin: 2.5 g/dL — ABNORMAL LOW (ref 3.5–5.0)
Alkaline Phosphatase: 161 U/L — ABNORMAL HIGH (ref 38–126)
Anion gap: 8 (ref 5–15)
BUN: 19 mg/dL (ref 8–23)
CO2: 17 mmol/L — ABNORMAL LOW (ref 22–32)
Calcium: 7.2 mg/dL — ABNORMAL LOW (ref 8.9–10.3)
Chloride: 111 mmol/L (ref 98–111)
Creatinine, Ser: 1.62 mg/dL — ABNORMAL HIGH (ref 0.61–1.24)
GFR, Estimated: 45 mL/min — ABNORMAL LOW (ref >60)
Glucose, Bld: 111 mg/dL — ABNORMAL HIGH (ref 70–99)
Potassium: 4.4 mmol/L (ref 3.5–5.1)
Sodium: 136 mmol/L (ref 135–145)
Total Bilirubin: 0.6 mg/dL (ref 0.3–1.2)
Total Protein: 4.9 g/dL — ABNORMAL LOW (ref 6.5–8.1)

## 2021-12-16 LAB — IRON AND TIBC
Iron: 123 ug/dL (ref 45–182)
Saturation Ratios: 56 % — ABNORMAL HIGH (ref 17.9–39.5)
TIBC: 219 ug/dL — ABNORMAL LOW (ref 250–450)
UIBC: 96 ug/dL

## 2021-12-16 LAB — MAGNESIUM: Magnesium: 1.7 mg/dL (ref 1.7–2.4)

## 2021-12-16 MED ORDER — PALONOSETRON HCL INJECTION 0.25 MG/5ML
0.2500 mg | Freq: Once | INTRAVENOUS | Status: AC
  Administered 2021-12-16: 0.25 mg via INTRAVENOUS
  Filled 2021-12-16: qty 5

## 2021-12-16 MED ORDER — SODIUM CHLORIDE 0.9 % IV SOLN: INTRAVENOUS | Status: AC

## 2021-12-16 MED ORDER — SODIUM CHLORIDE 0.9 % IV SOLN
400.0000 mg/m2 | Freq: Once | INTRAVENOUS | Status: AC
  Administered 2021-12-16: 644 mg via INTRAVENOUS
  Filled 2021-12-16: qty 32.2

## 2021-12-16 MED ORDER — SODIUM CHLORIDE 0.9 % IV SOLN
150.0000 mg | Freq: Once | INTRAVENOUS | Status: AC
  Administered 2021-12-16: 150 mg via INTRAVENOUS
  Filled 2021-12-16: qty 150

## 2021-12-16 MED ORDER — DEXTROSE 5 % IV SOLN: Freq: Once | INTRAVENOUS | Status: AC

## 2021-12-16 MED ORDER — SODIUM CHLORIDE 0.9 % IV SOLN
2400.0000 mg/m2 | INTRAVENOUS | Status: DC
  Administered 2021-12-16: 3850 mg via INTRAVENOUS
  Filled 2021-12-16: qty 77

## 2021-12-16 MED ORDER — ATROPINE SULFATE 1 MG/ML IV SOLN
0.5000 mg | Freq: Once | INTRAVENOUS | Status: AC
  Administered 2021-12-16: 0.5 mg via INTRAVENOUS
  Filled 2021-12-16: qty 1

## 2021-12-16 MED ORDER — SODIUM CHLORIDE 0.9 % IV SOLN
125.0000 mg/m2 | Freq: Once | INTRAVENOUS | Status: AC
  Administered 2021-12-16: 200 mg via INTRAVENOUS
  Filled 2021-12-16: qty 10

## 2021-12-16 MED ORDER — SODIUM CHLORIDE 0.9% FLUSH: 10.0000 mL | INTRAVENOUS | Status: DC | PRN

## 2021-12-16 MED ORDER — HEPARIN SOD (PORK) LOCK FLUSH 100 UNIT/ML IV SOLN: 500.0000 [IU] | Freq: Once | INTRAVENOUS | Status: DC | PRN

## 2021-12-16 MED ORDER — SODIUM CHLORIDE 0.9 % IV SOLN: Freq: Once | INTRAVENOUS | Status: AC

## 2021-12-16 MED ORDER — OXALIPLATIN CHEMO INJECTION 100 MG/20ML
62.0000 mg/m2 | Freq: Once | INTRAVENOUS | Status: AC
  Administered 2021-12-16: 100 mg via INTRAVENOUS
  Filled 2021-12-16: qty 20

## 2021-12-16 MED ORDER — SODIUM CHLORIDE 0.9 % IV SOLN
10.0000 mg | Freq: Once | INTRAVENOUS | Status: AC
  Administered 2021-12-16: 10 mg via INTRAVENOUS
  Filled 2021-12-16: qty 10

## 2021-12-16 NOTE — Progress Notes (Signed)
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   CURRENT THERAPY: FOLFIRINOX every 2 weeks  BRIEF ONCOLOGIC HISTORY:  Oncology History  Pancreatic cancer ([place])  10/07/2021 Initial Diagnosis   Pancreatic cancer ([place])   10/13/2021 - 11/19/2021 Chemotherapy   Patient is on Treatment Plan : PANCREAS Modified FOLFIRINOX q14d x 4 cycles     10/13/2021 -  Chemotherapy   Patient is on Treatment Plan : PANCREAS Modified FOLFIRINOX q14d x 4 cycles       CANCER STAGING:  Cancer Staging  Adenocarcinoma of colon with mucinous features Staging form: Colon and Rectum, AJCC 7th Edition - Clinical: Stage IIIB (T3, N1, M0) - Signed by Baird Cancer, PA on 09/22/2010  Pancreatic cancer [facility]) Staging form: Exocrine Pancreas, AJCC 8th Edition - Clinical stage from 10/07/2021: Stage IIB (cT1, cN1, cM0) - Unsigned    INTERVAL HISTORY:  [name] 71 y.o. male seen for follow-up and toxicity assessment prior to next cycle of chemotherapy for rectal cancer.  He reports some diarrhea and is taking Imodium 1 tablet twice daily.  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  Abdominal pain on the left side is stable to better.   REVIEW OF SYSTEMS:  Review of Systems  Gastrointestinal:  Positive for diarrhea and nausea.  Neurological:  Positive for numbness (feet).  Psychiatric/Behavioral:  Positive for sleep disturbance.   All other systems reviewed and are negative.    PAST MEDICAL/SURGICAL HISTORY:  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   Stage 3   Anemia    Anxiety    Arthritis    Barrett's esophagus    Blood transfusion    Bowel obstruction ([place]) 05/13/2012   Recurrent    Bronchitis    Chest pain at rest    Chronic abdominal pain    Erosive esophagitis    ETOH abuse    quit 03/2010   GERD (gastroesophageal reflux disease)    Hx of Clostridium difficile infection 01/2012   Hypertension    Ileus ([place])    Iron deficiency anemia 03/23/2016   Lung cancer ([place])    Obstruction of bowel ([place]) 03/03/2014   Osteoporosis    Personal history of PE (pulmonary embolism) 10/01/2010   Pneumonia    Pulmonary embolism ([place]) 02/2010   Recurrent upper respiratory infection (URI)    Renal disorder    S/P endoscopy 09/28/2010   erosive reflux esophagitis, Billroth I anatomy   S/P partial gastrectomy 1980s   Seizures ([place])    was on meds for 6 months, Unknown etiology, last seizure was 2017.   Shortness of breath    TIA (transient ischemic attack) 12/2009   Vitamin B12 deficiency    Past Surgical History:  Procedure Laterality Date   ABDOMINAL ADHESION SURGERY  03/04/15   @ [facility]   ABDOMINAL EXPLORATION SURGERY     abdominal sugery     for bowel obstruction x 8, all in 1980s, except for one in 07/2010   APPENDECTOMY  1980s   Billroth 1 hemigastrectomy  1980s   per patient for benign duodenal tumor   CARDIAC CATHETERIZATION  07/17/2012   CHOLECYSTECTOMY  1980s   COLON SURGERY  May 2012  left hemicolectomy, colon cancer found at time of surgery for bowel obstruction   COLON SURGERY  10/12/2018   At [facility]: Resection of right colon and terminal ileum with creation of ileorectal anastomosis for large bowel obstruction.  Cecum and ascending colon noted to be attached to the rectum.   COLONOSCOPY  03/18/2011   anastomosis at 35cm. Several adenomatous polyps removed. Sigmoid diverticulosis. Next TCS 02/2013   COLONOSCOPY N/A 07/24/2012   RSW:NIOEVO post segmental resection with normal-appearing colonic anastomosis aside from an adjacent polyp-removed as described above. Rectal polyp-removed as described above. CT findings appear to have been artifactual. tubular  adenomas/prolapsed type polyp.   COLONOSCOPY N/A 05/15/2015   Procedure: COLONOSCOPY;  Surgeon: [name], MD;  [facility];  Service: Endoscopy;  Laterality: N/A;   COLONOSCOPY  09/26/2018   at [facility]: prep not adequate for colon cancer surveillance.  Prior end-to-end colonic anastomosis in the rectosigmoid region.  This was patent and characterized by healthy-appearing mucosa.  Anastomosis was traversed.  Normal terminal ileum.   COLONOSCOPY WITH PROPOFOL N/A 11/04/2016   Dr. [name], status post subtotal colectomy with normal-appearing residual lower GI tract.  Next colonoscopy in 5 years.   ESOPHAGOGASTRODUODENOSCOPY  09/28/2010   ESOPHAGOGASTRODUODENOSCOPY  12/01/2010   Cervical web status post dilation, erosive esophagitis, B1 hemigastrectomy, inflamed anastomosis   ESOPHAGOGASTRODUODENOSCOPY  04/16/2011   excoriation at [facility] c/w trauma/M-W tear, friable gastric anastomosis, dilation efferent limb   ESOPHAGOGASTRODUODENOSCOPY N/A 06/03/2014   Dr.[name]- cervcal esopphageal web s/p dilation. abnormal distal esophagus bx= barretts esophagus   ESOPHAGOGASTRODUODENOSCOPY N/A 05/15/2015   Procedure: ESOPHAGOGASTRODUODENOSCOPY (EGD);  Surgeon: [name], MD;  [facility];  Service: Endoscopy;  Laterality: N/A;  230   ESOPHAGOGASTRODUODENOSCOPY (EGD) WITH ESOPHAGEAL DILATION  02/25/2012   JJK:[id number] esophageal web-s/p dilation anddisruption as described above. Status post prior gastric with Billroth I configuration. Abnormal gastric mucosa at the anastomosis. Gastric biopsy showed mild chronic inflammation but no H. pylori    ESOPHAGOGASTRODUODENOSCOPY (EGD) WITH PROPOFOL N/A 11/04/2016   Dr. [name]: Reflux esophagitis, small hiatal hernia status post hemigastrectomy.  Single patent efferent small bowel limb appeared normal, 2 x 2 centimeter tongue of salmon epithelium again seen, esophageal dilation.  Biopsies consistent with reflux changes, not Barrett's however this was  confirmed on prior EGDs.  Offer 3-year follow-up EGD August 2021.   ESOPHAGOGASTRODUODENOSCOPY (EGD) WITH PROPOFOL N/A 03/03/2020   Procedure: ESOPHAGOGASTRODUODENOSCOPY (EGD) WITH PROPOFOL;  Surgeon: [name], MD;  [facility];  Service: Endoscopy;  Laterality: N/A;  3:00pm   HERNIA REPAIR     right inguinal   MALONEY DILATION N/A 06/03/2014   Procedure: [name] DILATION;  Surgeon: [name], MD;  [facility];  Service: Endoscopy;  Laterality: N/A;   MALONEY DILATION N/A 05/15/2015   Procedure: [name] DILATION;  Surgeon: [name], MD;  [facility];  Service: Endoscopy;  Laterality: N/A;   MALONEY DILATION N/A 11/04/2016   Procedure: [name] DILATION;  Surgeon: [name], MD;  [facility];  Service: Endoscopy;  Laterality: N/A;   MALONEY DILATION N/A 03/03/2020   Procedure: [name] DILATION;  Surgeon: [name], MD;  [facility];  Service: Endoscopy;  Laterality: N/A;   PORTACATH PLACEMENT     SAVORY DILATION N/A 06/03/2014   Procedure: SAVORY DILATION;  Surgeon: [name], MD;  [facility];  Service: Endoscopy;  Laterality: N/A;     SOCIAL HISTORY:  Social  History   Socioeconomic History   Marital status: Married    Spouse name: Not on file   Number of children: 3   Years of education: Not on file   Highest education level: Not on file  Occupational History    Employer: [employer]  Tobacco Use   Smoking status: Former    Packs/day: 0.50    Years: 40.00    Total pack years: 20.00    Types: Cigarettes    Quit date: 12/20/2012    Years since quitting: 8.9   Smokeless tobacco: Never  Vaping Use   Vaping Use: Never used  Substance and Sexual Activity   Alcohol use: No   Drug use: No   Sexual activity: Never  Other Topics Concern   Not on file  Social History Narrative   Not on file   Social Determinants of Health   Financial Resource Strain: Not on file  Food  Insecurity: Not on file  Transportation Needs: Not on file  Physical Activity: Not on file  Stress: Not on file  Social Connections: Not on file  Intimate Partner Violence: Not on file    FAMILY HISTORY:  Family History  Problem Relation Age of Onset   Hypertension Mother    Arthritis Mother    Pneumonia Mother    Hypertension Father    Heart attack Father    Colon cancer Neg Hx     CURRENT MEDICATIONS:  Outpatient Encounter Medications as of 12/16/2021  Medication Sig   ammonium lactate (AMLACTIN) 12 % cream Apply 1 g topically See admin instructions. APPLY CREAM TO DRY SKIN ON LEGS ARMS HANDS AND BACK AREA ONCE TO TWICE DAILY (AVOID FACE GROIN UNDERARMS)   apixaban (ELIQUIS) 5 MG TABS tablet Take 1 tablet (5 mg total) by mouth 2 (two) times daily.   atorvastatin (LIPITOR) 20 MG tablet Take by mouth.   Bacillus Coagulans-Inulin (PROBIOTIC) 1-250 BILLION-MG CAPS Take by mouth.   baclofen (LIORESAL) 10 MG tablet as needed.   carvedilol (COREG) 25 MG tablet Take 0.5 tablets (12.5 mg total) by mouth 2 (two) times daily with a meal. (Patient taking differently: Take 25 mg by mouth 2 (two) times daily with a meal. Takes 25 mg 2 times daily.)   Cholecalciferol (VITAMIN D) 2000 units CAPS Take 2,000 Units by mouth daily.   Cholecalciferol (VITAMIN D3) 10 MCG (400 UNIT) tablet Take by mouth.   CREON 36000-114000 units CPEP capsule Take by mouth.   cyanocobalamin 100 MCG tablet Take by mouth.   diclofenac Sodium (VOLTAREN) 1 % GEL Apply topically.   diphenoxylate-atropine (LOMOTIL) 2.5-0.025 MG tablet TAKE 1 TABLET BY MOUTH 4 TIMES A DAY   ferrous sulfate 325 (65 FE) MG EC tablet Take by mouth.   gabapentin (NEURONTIN) 100 MG capsule TAKE 1 CAPSULE BY MOUTH EVERY DAY AT BEDTIME   HYDROcodone-acetaminophen (NORCO/VICODIN) 5-325 MG tablet Take 1 tablet by mouth every 8 (eight) hours as needed for moderate pain.   loperamide (IMODIUM A-D) 2 MG tablet Take 2 mg by mouth in the morning and at  bedtime.   magnesium oxide (MAG-OX) 400 (240 Mg) MG tablet Take 1 tablet (400 mg total) by mouth 2 (two) times daily.   mirtazapine (REMERON) 30 MG tablet TAKE 1 TABLET BY MOUTH AT BEDTIME   Multiple Vitamin (MULTIVITAMIN WITH MINERALS) TABS tablet Take 1 tablet by mouth daily.   ondansetron (ZOFRAN-ODT) 4 MG disintegrating tablet TAKE 1 TABLET BY MOUTH EVERY 4 HOURS AS NEEDED FOR NAUSEA AND  VOMITING   pantoprazole (PROTONIX) 40 MG tablet Take 1 tablet (40 mg total) by mouth 2 (two) times daily before a meal.   PREVALITE 4 g packet Take 1 packet (4 g total) by mouth daily. DO NOT TAKE WITHIN 2 HOURS OF OTHER MEDICATIONS   sucralfate (CARAFATE) 1 GM/10ML suspension TAKE 10 MLS (1 G TOTAL) BY MOUTH 4 (FOUR) TIMES DAILY AS NEEDED. FOR BREAKTHROUGH HEARTBURN   traMADol (ULTRAM) 50 MG tablet Take 1 tablet by mouth every 6 (six) hours as needed.   prochlorperazine (COMPAZINE) 10 MG tablet Take by mouth. (Patient not taking: Reported on 12/16/2021)   [DISCONTINUED] lidocaine-prilocaine (EMLA) cream Apply to port site 1 hour prior to port access   Facility-Administered Encounter Medications as of 12/16/2021  Medication   acetaminophen (TYLENOL) 325 MG tablet   heparin lock flush 100 unit/mL   [COMPLETED] irinotecan (CAMPTOSAR) 200 mg in sodium chloride 0.9 % 500 mL chemo infusion   [COMPLETED] leucovorin 644 mg in sodium chloride 0.9 % 250 mL infusion   loratadine (CLARITIN) 10 MG tablet   [COMPLETED] oxaliplatin (ELOXATIN) 100 mg in dextrose 5 % 500 mL chemo infusion   sodium chloride flush (NS) 0.9 % injection 10 mL   [COMPLETED] fluorouracil (ADRUCIL) 3,850 mg in sodium chloride 0.9 % 73 mL chemo infusion    ALLERGIES:  No Known Allergies   PHYSICAL EXAM:  ECOG Performance status: 1  There were no vitals filed for this visit. There were no vitals filed for this visit. Physical Exam Vitals reviewed.  Constitutional:      Appearance: Normal appearance.  Cardiovascular:     Rate and  Rhythm: Normal rate and regular rhythm.     Heart sounds: Normal heart sounds.  Pulmonary:     Breath sounds: Normal breath sounds.  Abdominal:     Palpations: Abdomen is soft. There is no mass.  Neurological:     General: No focal deficit present.     Mental Status: He is alert and oriented to person, place, and time.  Psychiatric:        Mood and Affect: Mood normal.        Behavior: Behavior normal.      LABORATORY DATA:  I have reviewed the labs as listed.  CBC    Component Value Date/Time   WBC 35.1 (H) 12/16/2021 0750   RBC 2.63 (L) 12/16/2021 0750   HGB 8.3 (L) 12/16/2021 0750   HCT 25.0 (L) 12/16/2021 0750   PLT 139 (L) 12/16/2021 0750   MCV 95.1 12/16/2021 0750   MCH 31.6 12/16/2021 0750   MCHC 33.2 12/16/2021 0750   RDW 20.3 (H) 12/16/2021 0750   LYMPHSABS 3.0 12/16/2021 0750   MONOABS 1.7 (H) 12/16/2021 0750   EOSABS 0.1 12/16/2021 0750   BASOSABS 0.1 12/16/2021 0750      Latest Ref Rng & Units 12/16/2021    7:50 AM 12/01/2021    8:36 AM 11/17/2021    8:07 AM  CMP  Glucose 70 - 99 mg/dL 111  92  97   BUN 8 - 23 mg/dL '19  23  13   ' Creatinine 0.61 - 1.24 mg/dL 1.62  1.46  1.21   Sodium 135 - 145 mmol/L 136  136  136   Potassium 3.5 - 5.1 mmol/L 4.4  4.1  4.3   Chloride 98 - 111 mmol/L 111  108  111   CO2 22 - 32 mmol/L '17  21  21   ' Calcium 8.9 - 10.3 mg/dL  7.2  7.4  7.6   Total Protein 6.5 - 8.1 g/dL 4.9  5.6  5.2   Total Bilirubin 0.3 - 1.2 mg/dL 0.6  0.4  0.6   Alkaline Phos 38 - 126 U/L 161  158  103   AST 15 - 41 U/L '18  14  18   ' ALT 0 - 44 U/L '13  14  13     ' DIAGNOSTIC IMAGING:  I have independently reviewed the scans and discussed with the patient.  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  No lymphadenopathy. - EUS and FNA (09/21/2021): Oval mass in the pancreatic tail, hypoechoic, 20 mm x 17 mm.   Remainder of the pancreas shows decreased visualization of the pancreas. - Pathology: Invasive adenocarcinoma, ductal type, moderately to poorly differentiated.  Background desmoplastic stroma, blood and pancreatic tissue.  Morphologically distinct from prior lung adenocarcinoma.  TTF-1 and Napsin A were negative. - Patient evaluated by Dr. Dayton Scrape at [facility] for pancreatic resection. - CA 19-9 (10/02/2021): 6.47 - PET CT scan (10/02/2021): Ill-defined 4 cm mass in the left upper quadrant in the region of the pancreatic tail with mild hypermetabolism, SUV 4.7.  Soft tissue nodule in the gastrohepatic ligament measuring 2 x 1.3 cm, SUV 4.0.  Suspicious for metastatic lymphadenopathy.  9 mm mediastinal lymph node and AP window is hypermetabolic SUV 4.9.  Borderline enlarged left hilar lymph nodes show hypermetabolic activity SUV 3.4.  Previously seen subpleural nodule in the superior right lower lobe has nearly completely resolved.  Overall mild hypermetabolic mediastinal and left hilar lymph nodes which are nonspecific.  May be reactive in etiology although metastatic disease cannot definitely be excluded.        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 -Last EGD/colonoscopy on 05/15/2015 was normal. - CT scan in October 2018 was negative for metastatic disease. 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  Margins negative.  2 hilar lymph nodes were negative. - No adjuvant chemotherapy was needed.    Social/family history: - Lives at home with his wife.  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  No  chemical exposure.  Quit smoking in 2014 and smoked 1 pack/day for 44 years. - Paternal cousin has bone cancer.    PLAN:   Stage IIB adenocarcinoma of the pancreatic tail: - Cycle 4 of FOLFIRINOX was on 12/01/2021. - He has some fatigue but has tolerated it very well. - Labs from today shows alk phos at 161 noninvolvement 2.5.  CBC was grossly normal except significant anemia. - He will proceed with cycle 5 today.  As his creatinine was mildly elevated, he will also receive 100 mL of normal saline. - RTC 2 weeks for follow-up with repeat labs and tumor marker.  I plan to repeat scan after cycle 6.  2.  Abdominal pain: - Left upper quadrant pain is stable to slightly better.   3. Iron deficiency: - He completed Feraheme on 11/11/2021 and 11/17/2021. - However hemoglobin today is 8.3, likely from myelosuppression.  We will closely monitor.   4. 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  This has slightly worsened since last treatment.  We will closely monitor and see if we need to further cut back on doses.  5.  Chronic diarrhea: - He is taking Imodium 1 tablet twice daily.  He still has watery diarrhea up to 6-7 stools per day. - Recommend increasing Imodium to 2 tablets twice daily.  6.  Weight loss: - It has been stable since last visit.        Orders placed this encounter:  No orders of the defined types were placed in this encounter.     Derek Jack, MD [place] [phone]

## 2021-12-16 NOTE — Progress Notes (Signed)
Patient has been assessed, vital signs and labs have been reviewed by Dr. Delton Coombes. ANC, Creatinine, LFTs, and Platelets are within treatment parameters per Dr. Delton Coombes. The patient is good to proceed with treatment at this time. Patient to received 527mL NS over one hour in addition to treatment per MD due to creatinine. Patient will also have iron/tibc and ferritin labs drawn due to hemoglobin. Primary RN and pharmacy aware.

## 2021-12-16 NOTE — Patient Instructions (Signed)
[facility]  Discharge Instructions: Thank you for choosing [facility] to provide your oncology and hematology care.  If you have a lab appointment with the [facility], please come in thru the Main Entrance and check in at the main information desk.  Wear comfortable clothing and clothing appropriate for easy access to any Portacath or PICC line.   We strive to give you quality time with your provider. You may need to reschedule your appointment if you arrive late (15 or more minutes).  Arriving late affects you and other patients whose appointments are after yours.  Also, if you miss three or more appointments without notifying the office, you may be dismissed from the clinic at the provider's discretion.      For prescription refill requests, have your pharmacy contact our office and allow 72 hours for refills to be completed.    Today you received the following chemotherapy and/or immunotherapy agents  Folfoxfiri 5FU pump start      To help prevent nausea and vomiting after your treatment, we encourage you to take your nausea medication as directed.  BELOW ARE SYMPTOMS THAT SHOULD BE REPORTED IMMEDIATELY: *FEVER GREATER THAN 100.4 F (38 C) OR HIGHER *CHILLS OR SWEATING *NAUSEA AND VOMITING THAT IS NOT CONTROLLED WITH YOUR NAUSEA MEDICATION *UNUSUAL SHORTNESS OF BREATH *UNUSUAL BRUISING OR BLEEDING *URINARY PROBLEMS (pain or burning when urinating, or frequent urination) *BOWEL PROBLEMS (unusual diarrhea, constipation, pain near the anus) TENDERNESS IN MOUTH AND THROAT WITH OR WITHOUT PRESENCE OF ULCERS (sore throat, sores in mouth, or a toothache) UNUSUAL RASH, SWELLING OR PAIN  UNUSUAL VAGINAL DISCHARGE OR ITCHING   Items with * indicate a potential emergency and should be followed up as soon as possible or go to the Emergency Department if any problems should occur.  Please show the CHEMOTHERAPY ALERT CARD or IMMUNOTHERAPY ALERT CARD at  check-in to the Emergency Department and triage nurse.  Should you have questions after your visit or need to cancel or reschedule your appointment, please contact [facility] [phone]  and follow the prompts.  Office hours are 8:00 a.m. to 4:30 p.m. Monday - Friday. Please note that voicemails left after 4:00 p.m. may not be returned until the following business day.  We are closed weekends and major holidays. You have access to a nurse at all times for urgent questions. Please call the main number to the clinic [phone] and follow the prompts.  For any non-urgent questions, you may also contact your provider using MyChart. We now offer e-Visits for anyone 56 and older to request care online for non-urgent symptoms. For details visit [email or web address].   Also download the MyChart app! Go to the app store, search "MyChart", open the app, select [facility], and log in with your MyChart username and password.  Masks are optional in the cancer centers. If you would like for your care team to wear a mask while they are taking care of you, please let them know. You may have one support person who is at least 71 years old accompany you for your appointments.

## 2021-12-16 NOTE — Patient Instructions (Signed)
[facility]  Discharge Instructions  You were seen and examined today by Dr. Delton Coombes.  Proceed with treatment today as planned. You will get additional fluids today.  Your hemoglobin was low. You received iron one month ago - no more iron will be given at this time, but we will recheck your iron levels.  For your diarrhea, please increase Imodium to two in the morning and two in the evening.  Follow-up as scheduled for your next treatment.   Thank you for choosing [facility] to provide your oncology and hematology care.   To afford each patient quality time with our provider, please arrive at least 15 minutes before your scheduled appointment time. You may need to reschedule your appointment if you arrive late (10 or more minutes). Arriving late affects you and other patients whose appointments are after yours.  Also, if you miss three or more appointments without notifying the office, you may be dismissed from the clinic at the provider's discretion.    Again, thank you for choosing [facility].  Our hope is that these requests will decrease the amount of time that you wait before being seen by our physicians.   If you have a lab appointment with the [facility] please come in thru the Main Entrance and check in at the main information desk.           _____________________________________________________________  Should you have questions after your visit to [facility], please contact our office at [phone] and follow the prompts.  Our office hours are 8:00 a.m. to 4:30 p.m. Monday - Thursday and 8:00 a.m. to 2:30 p.m. Friday.  Please note that voicemails left after 4:00 p.m. may not be returned until the following business day.  We are closed weekends and all major holidays.  You do have access to a nurse 24-7, just call the main number to the clinic [phone] and do not press any options, hold  on the line and a nurse will answer the phone.    For prescription refill requests, have your pharmacy contact our office and allow 72 hours.    Masks are optional in the cancer centers. If you would like for your care team to wear a mask while they are taking care of you, please let them know. You may have one support person who is at least 71 years old accompany you for your appointments.

## 2021-12-16 NOTE — Telephone Encounter (Signed)
Recall placed

## 2021-12-16 NOTE — Progress Notes (Signed)
Patient presents today for [facility] with pump startper providers order.  Vital signs and labs within parameters for treatment.  Patient has no new complaints at this time.  Message received from Adonis Huguenin RN/ [place] patient okay for treatment and will receive 500 cc of saline over and hour with treatment.  Treatment given today per MD orders.  Tolerated infusion without adverse affects.  5FU pump connected and verified RUN on the screen with the patient.  Vital signs stable.  No complaints at this time.  Discharge from clinic ambulatory in stable condition.  Alert and oriented X 3.  Follow up with [facility] as scheduled.

## 2021-12-18 ENCOUNTER — Inpatient Hospital Stay: Payer: Medicare Other

## 2021-12-18 VITALS — BP 98/58 | HR 81 | Temp 97.8°F | Resp 18

## 2021-12-18 DIAGNOSIS — Z5111 Encounter for antineoplastic chemotherapy: Secondary | ICD-10-CM | POA: Diagnosis not present

## 2021-12-18 DIAGNOSIS — C252 Malignant neoplasm of tail of pancreas: Secondary | ICD-10-CM

## 2021-12-18 LAB — CANCER ANTIGEN 19-9: CA 19-9: <2 U/mL (ref 0–35)

## 2021-12-18 MED ORDER — PEGFILGRASTIM-CBQV 6 MG/0.6ML ~~LOC~~ SOSY
6.0000 mg | PREFILLED_SYRINGE | Freq: Once | SUBCUTANEOUS | Status: AC
  Administered 2021-12-18: 6 mg via SUBCUTANEOUS
  Filled 2021-12-18: qty 0.6

## 2021-12-18 MED ORDER — SODIUM CHLORIDE 0.9% FLUSH
10.0000 mL | INTRAVENOUS | Status: DC | PRN
  Administered 2021-12-18: 10 mL

## 2021-12-18 MED ORDER — HEPARIN SOD (PORK) LOCK FLUSH 100 UNIT/ML IV SOLN
500.0000 [IU] | Freq: Once | INTRAVENOUS | Status: AC | PRN
  Administered 2021-12-18: 500 [IU]

## 2021-12-18 NOTE — Progress Notes (Signed)
Patient presents today for pump d/c. Vital signs are stable. Port a cath site clean, dry, and intact. Port flushed with 10 mls of Normal Saline and 500 Units of Heparin. Needle removed intact. Band aid applied. Patient has no complaints at this time. Discharged from clinic ambulatory and in stable condition. Patient alert and oriented.  

## 2021-12-18 NOTE — Patient Instructions (Signed)
[name]  Discharge Instructions: Thank you for choosing [facility] to provide your oncology and hematology care.  If you have a lab appointment with the [facility], please come in thru the Main Entrance and check in at the main information desk.  Wear comfortable clothing and clothing appropriate for easy access to any Portacath or PICC line.   We strive to give you quality time with your provider. You may need to reschedule your appointment if you arrive late (15 or more minutes).  Arriving late affects you and other patients whose appointments are after yours.  Also, if you miss three or more appointments without notifying the office, you may be dismissed from the clinic at the provider's discretion.      For prescription refill requests, have your pharmacy contact our office and allow 72 hours for refills to be completed.    Today you received the following chemotherapy and/or immunotherapy agents 5FU/ Udenyca injection.  Fluorouracil Injection What is this medication? FLUOROURACIL (flure oh YOOR a sil) treats some types of cancer. It works by slowing down the growth of cancer cells. This medicine may be used for other purposes; ask your health care provider or pharmacist if you have questions. COMMON BRAND NAME(S): Adrucil What should I tell my care team before I take this medication? They need to know if you have any of these conditions: Blood disorders Dihydropyrimidine dehydrogenase (DPD) deficiency Infection, such as chickenpox, cold sores, herpes Kidney disease Liver disease Poor nutrition Recent or ongoing radiation therapy An unusual or allergic reaction to fluorouracil, other medications, foods, dyes, or preservatives If you or your partner are pregnant or trying to get pregnant Breast-feeding How should I use this medication? This medication is injected into a vein. It is administered by your care team in a hospital or clinic  setting. Talk to your care team about the use of this medication in children. Special care may be needed. Overdosage: If you think you have taken too much of this medicine contact a poison control center or emergency room at once. NOTE: This medicine is only for you. Do not share this medicine with others. What if I miss a dose? Keep appointments for follow-up doses. It is important not to miss your dose. Call your care team if you are unable to keep an appointment. What may interact with this medication? Do not take this medication with any of the following: Live virus vaccines This medication may also interact with the following: Medications that treat or prevent blood clots, such as warfarin, enoxaparin, dalteparin This list may not describe all possible interactions. Give your health care provider a list of all the medicines, herbs, non-prescription drugs, or dietary supplements you use. Also tell them if you smoke, drink alcohol, or use illegal drugs. Some items may interact with your medicine. What should I watch for while using this medication? Your condition will be monitored carefully while you are receiving this medication. This medication may make you feel generally unwell. This is not uncommon as chemotherapy can affect healthy cells as well as cancer cells. Report any side effects. Continue your course of treatment even though you feel ill unless your care team tells you to stop. In some cases, you may be given additional medications to help with side effects. Follow all directions for their use. This medication may increase your risk of getting an infection. Call your care team for advice if you get a fever, chills, sore throat, or  other symptoms of a cold or flu. Do not treat yourself. Try to avoid being around people who are sick. This medication may increase your risk to bruise or bleed. Call your care team if you notice any unusual bleeding. Be careful brushing or flossing your  teeth or using a toothpick because you may get an infection or bleed more easily. If you have any dental work done, tell your dentist you are receiving this medication. Avoid taking medications that contain aspirin, acetaminophen, ibuprofen, naproxen, or ketoprofen unless instructed by your care team. These medications may hide a fever. Do not treat diarrhea with over the counter products. Contact your care team if you have diarrhea that lasts more than 2 days or if it is severe and watery. This medication can make you more sensitive to the sun. Keep out of the sun. If you cannot avoid being in the sun, wear protective clothing and sunscreen. Do not use sun lamps, tanning beds, or tanning booths. Talk to your care team if you or your partner wish to become pregnant or think you might be pregnant. This medication can cause serious birth defects if taken during pregnancy and for 3 months after the last dose. A reliable form of contraception is recommended while taking this medication and for 3 months after the last dose. Talk to your care team about effective forms of contraception. Do not father a child while taking this medication and for 3 months after the last dose. Use a condom while having sex during this time period. Do not breastfeed while taking this medication. This medication may cause infertility. Talk to your care team if you are concerned about your fertility. What side effects may I notice from receiving this medication? Side effects that you should report to your care team as soon as possible: Allergic reactions--skin rash, itching, hives, swelling of the face, lips, tongue, or throat Heart attack--pain or tightness in the chest, shoulders, arms, or jaw, nausea, shortness of breath, cold or clammy skin, feeling faint or lightheaded Heart failure--shortness of breath, swelling of the ankles, feet, or hands, sudden weight gain, unusual weakness or fatigue Heart rhythm changes--fast or  irregular heartbeat, dizziness, feeling faint or lightheaded, chest pain, trouble breathing High ammonia level--unusual weakness or fatigue, confusion, loss of appetite, nausea, vomiting, seizures Infection--fever, chills, cough, sore throat, wounds that don't heal, pain or trouble when passing urine, general feeling of discomfort or being unwell Low red blood cell level--unusual weakness or fatigue, dizziness, headache, trouble breathing Pain, tingling, or numbness in the hands or feet, muscle weakness, change in vision, confusion or trouble speaking, loss of balance or coordination, trouble walking, seizures Redness, swelling, and blistering of the skin over hands and feet Severe or prolonged diarrhea Unusual bruising or bleeding Side effects that usually do not require medical attention (report to your care team if they continue or are bothersome): Dry skin Headache Increased tears Nausea Pain, redness, or swelling with sores inside the mouth or throat Sensitivity to light Vomiting This list may not describe all possible side effects. Call your doctor for medical advice about side effects. You may report side effects to FDA at 1-800-FDA-1088. Where should I keep my medication? This medication is given in a hospital or clinic. It will not be stored at home. NOTE: This sheet is a summary. It may not cover all possible information. If you have questions about this medicine, talk to your doctor, pharmacist, or health care provider.  2023 Elsevier/Gold Standard (2021-07-14 00:00:00)  To help prevent nausea and vomiting after your treatment, we encourage you to take your nausea medication as directed.  BELOW ARE SYMPTOMS THAT SHOULD BE REPORTED IMMEDIATELY: *FEVER GREATER THAN 100.4 F (38 C) OR HIGHER *CHILLS OR SWEATING *NAUSEA AND VOMITING THAT IS NOT CONTROLLED WITH YOUR NAUSEA MEDICATION *UNUSUAL SHORTNESS OF BREATH *UNUSUAL BRUISING OR BLEEDING *URINARY PROBLEMS (pain or  burning when urinating, or frequent urination) *BOWEL PROBLEMS (unusual diarrhea, constipation, pain near the anus) TENDERNESS IN MOUTH AND THROAT WITH OR WITHOUT PRESENCE OF ULCERS (sore throat, sores in mouth, or a toothache) UNUSUAL RASH, SWELLING OR PAIN  UNUSUAL VAGINAL DISCHARGE OR ITCHING   Items with * indicate a potential emergency and should be followed up as soon as possible or go to the Emergency Department if any problems should occur.  Please show the CHEMOTHERAPY ALERT CARD or IMMUNOTHERAPY ALERT CARD at check-in to the Emergency Department and triage nurse.  Should you have questions after your visit or need to cancel or reschedule your appointment, please contact [facility] [phone]  and follow the prompts.  Office hours are 8:00 a.m. to 4:30 p.m. Monday - Friday. Please note that voicemails left after 4:00 p.m. may not be returned until the following business day.  We are closed weekends and major holidays. You have access to a nurse at all times for urgent questions. Please call the main number to the clinic [phone] and follow the prompts.  For any non-urgent questions, you may also contact your provider using MyChart. We now offer e-Visits for anyone 29 and older to request care online for non-urgent symptoms. For details visit [email or web address].   Also download the MyChart app! Go to the app store, search "MyChart", open the app, select [facility], and log in with your MyChart username and password.  Masks are optional in the cancer centers. If you would like for your care team to wear a mask while they are taking care of you, please let them know. You may have one support person who is at least 71 years old accompany you for your appointments.

## 2021-12-24 ENCOUNTER — Other Ambulatory Visit: Payer: Self-pay | Admitting: Gastroenterology

## 2021-12-24 ENCOUNTER — Other Ambulatory Visit: Payer: Self-pay | Admitting: *Deleted

## 2021-12-24 MED ORDER — HYDROCODONE-ACETAMINOPHEN 5-325 MG PO TABS: 1.0000 | ORAL_TABLET | Freq: Three times a day (TID) | ORAL | 0 refills | Status: DC | PRN

## 2021-12-24 NOTE — Telephone Encounter (Signed)
From: [name] To: Office of Derek Jack, MD Sent: 12/24/2021 1:49 AM EDT Subject: Medication Renewal Request  Refills have been requested for the following medications:   HYDROcodone-acetaminophen (NORCO/VICODIN) 5-325 MG tablet [Sreedhar Katragadda]  Preferred pharmacy: [id number] - [place], [place] - [address] Delivery method: Arlyss Gandy

## 2021-12-30 ENCOUNTER — Inpatient Hospital Stay: Payer: Medicare Other | Attending: Hematology

## 2021-12-30 ENCOUNTER — Other Ambulatory Visit (HOSPITAL_COMMUNITY): Payer: Medicare Other

## 2021-12-30 ENCOUNTER — Inpatient Hospital Stay (HOSPITAL_BASED_OUTPATIENT_CLINIC_OR_DEPARTMENT_OTHER): Payer: Medicare Other | Admitting: Hematology

## 2021-12-30 ENCOUNTER — Inpatient Hospital Stay: Payer: Medicare Other

## 2021-12-30 VITALS — BP 95/66 | HR 86 | Temp 96.8°F | Resp 18 | Ht 67.0 in | Wt 118.8 lb

## 2021-12-30 DIAGNOSIS — C252 Malignant neoplasm of tail of pancreas: Secondary | ICD-10-CM | POA: Diagnosis present

## 2021-12-30 DIAGNOSIS — Z5189 Encounter for other specified aftercare: Secondary | ICD-10-CM | POA: Diagnosis not present

## 2021-12-30 DIAGNOSIS — Z5111 Encounter for antineoplastic chemotherapy: Secondary | ICD-10-CM | POA: Diagnosis not present

## 2021-12-30 DIAGNOSIS — C259 Malignant neoplasm of pancreas, unspecified: Secondary | ICD-10-CM

## 2021-12-30 DIAGNOSIS — Z79899 Other long term (current) drug therapy: Secondary | ICD-10-CM | POA: Insufficient documentation

## 2021-12-30 DIAGNOSIS — Z85038 Personal history of other malignant neoplasm of large intestine: Secondary | ICD-10-CM

## 2021-12-30 DIAGNOSIS — C189 Malignant neoplasm of colon, unspecified: Secondary | ICD-10-CM

## 2021-12-30 LAB — COMPREHENSIVE METABOLIC PANEL
ALT: 9 U/L (ref 0–44)
AST: 19 U/L (ref 15–41)
Albumin: 2.3 g/dL — ABNORMAL LOW (ref 3.5–5.0)
Alkaline Phosphatase: 138 U/L — ABNORMAL HIGH (ref 38–126)
Anion gap: 7 (ref 5–15)
BUN: 12 mg/dL (ref 8–23)
CO2: 19 mmol/L — ABNORMAL LOW (ref 22–32)
Calcium: 7.3 mg/dL — ABNORMAL LOW (ref 8.9–10.3)
Chloride: 109 mmol/L (ref 98–111)
Creatinine, Ser: 1.4 mg/dL — ABNORMAL HIGH (ref 0.61–1.24)
GFR, Estimated: 54 mL/min — ABNORMAL LOW (ref >60)
Glucose, Bld: 87 mg/dL (ref 70–99)
Potassium: 4.2 mmol/L (ref 3.5–5.1)
Sodium: 135 mmol/L (ref 135–145)
Total Bilirubin: 0.5 mg/dL (ref 0.3–1.2)
Total Protein: 4.8 g/dL — ABNORMAL LOW (ref 6.5–8.1)

## 2021-12-30 LAB — CBC WITH DIFFERENTIAL/PLATELET
Abs Immature Granulocytes: 0.31 10*3/uL — ABNORMAL HIGH (ref 0.00–0.07)
Basophils Absolute: 0 10*3/uL (ref 0.0–0.1)
Basophils Relative: 0 %
Eosinophils Absolute: 0 10*3/uL (ref 0.0–0.5)
Eosinophils Relative: 0 %
HCT: 25.1 % — ABNORMAL LOW (ref 39.0–52.0)
Hemoglobin: 8 g/dL — ABNORMAL LOW (ref 13.0–17.0)
Immature Granulocytes: 2 %
Lymphocytes Relative: 11 %
Lymphs Abs: 1.7 10*3/uL (ref 0.7–4.0)
MCH: 31.6 pg (ref 26.0–34.0)
MCHC: 31.9 g/dL (ref 30.0–36.0)
MCV: 99.2 fL (ref 80.0–100.0)
Monocytes Absolute: 0.9 10*3/uL (ref 0.1–1.0)
Monocytes Relative: 6 %
Neutro Abs: 11.9 10*3/uL — ABNORMAL HIGH (ref 1.7–7.7)
Neutrophils Relative %: 81 %
Platelets: 147 10*3/uL — ABNORMAL LOW (ref 150–400)
RBC: 2.53 MIL/uL — ABNORMAL LOW (ref 4.22–5.81)
RDW: 19.9 % — ABNORMAL HIGH (ref 11.5–15.5)
WBC: 14.9 10*3/uL — ABNORMAL HIGH (ref 4.0–10.5)
nRBC: 0 % (ref 0.0–0.2)

## 2021-12-30 LAB — MAGNESIUM: Magnesium: 1.6 mg/dL — ABNORMAL LOW (ref 1.7–2.4)

## 2021-12-30 MED ORDER — SODIUM CHLORIDE 0.9% FLUSH
10.0000 mL | INTRAVENOUS | Status: DC | PRN
  Administered 2021-12-30: 10 mL

## 2021-12-30 MED ORDER — PALONOSETRON HCL INJECTION 0.25 MG/5ML
0.2500 mg | Freq: Once | INTRAVENOUS | Status: AC
  Administered 2021-12-30: 0.25 mg via INTRAVENOUS
  Filled 2021-12-30: qty 5

## 2021-12-30 MED ORDER — SODIUM CHLORIDE 0.9 % IV SOLN
10.0000 mg | Freq: Once | INTRAVENOUS | Status: AC
  Administered 2021-12-30: 10 mg via INTRAVENOUS
  Filled 2021-12-30: qty 10

## 2021-12-30 MED ORDER — SODIUM CHLORIDE 0.9 % IV SOLN
150.0000 mg | Freq: Once | INTRAVENOUS | Status: AC
  Administered 2021-12-30: 150 mg via INTRAVENOUS
  Filled 2021-12-30: qty 150

## 2021-12-30 MED ORDER — SODIUM CHLORIDE 0.9 % IV SOLN
125.0000 mg/m2 | Freq: Once | INTRAVENOUS | Status: AC
  Administered 2021-12-30: 200 mg via INTRAVENOUS
  Filled 2021-12-30: qty 10

## 2021-12-30 MED ORDER — HEPARIN SOD (PORK) LOCK FLUSH 100 UNIT/ML IV SOLN: 500.0000 [IU] | Freq: Once | INTRAVENOUS | Status: DC | PRN

## 2021-12-30 MED ORDER — DEXTROSE 5 % IV SOLN: Freq: Once | INTRAVENOUS | Status: AC

## 2021-12-30 MED ORDER — OXALIPLATIN CHEMO INJECTION 100 MG/20ML
62.0000 mg/m2 | Freq: Once | INTRAVENOUS | Status: AC
  Administered 2021-12-30: 100 mg via INTRAVENOUS
  Filled 2021-12-30: qty 20

## 2021-12-30 MED ORDER — SODIUM CHLORIDE 0.9 % IV SOLN
2400.0000 mg/m2 | INTRAVENOUS | Status: DC
  Administered 2021-12-30: 3850 mg via INTRAVENOUS
  Filled 2021-12-30: qty 77

## 2021-12-30 MED ORDER — SODIUM CHLORIDE 0.9 % IV SOLN
400.0000 mg/m2 | Freq: Once | INTRAVENOUS | Status: AC
  Administered 2021-12-30: 644 mg via INTRAVENOUS
  Filled 2021-12-30: qty 32.2

## 2021-12-30 MED ORDER — SODIUM CHLORIDE 0.9 % IV SOLN: Freq: Once | INTRAVENOUS | Status: AC

## 2021-12-30 NOTE — Progress Notes (Signed)
Patient has been examined by Dr. Katragadda, and vital signs and labs have been reviewed. ANC, Creatinine, LFTs, hemoglobin, and platelets are within treatment parameters per M.D. - pt may proceed with treatment.  Primary RN and pharmacy notified.  

## 2021-12-30 NOTE — Patient Instructions (Addendum)
[place] at [facility] Discharge Instructions   You were seen and examined today by Dr. Delton Coombes.  He reviewed the results of your lab work which are normal/stable.   We will proceed with your treatment today.   Try to improve your nutrition. You may use Boost or Ensure to supplement your diet.   Return as scheduled.    Thank you for choosing [facility] at [facility] to provide your oncology and hematology care.  To afford each patient quality time with our provider, please arrive at least 15 minutes before your scheduled appointment time.   If you have a lab appointment with the [facility] please come in thru the Main Entrance and check in at the main information desk.  You need to re-schedule your appointment should you arrive 10 or more minutes late.  We strive to give you quality time with our providers, and arriving late affects you and other patients whose appointments are after yours.  Also, if you no show three or more times for appointments you may be dismissed from the clinic at the providers discretion.     Again, thank you for choosing [facility].  Our hope is that these requests will decrease the amount of time that you wait before being seen by our physicians.       _____________________________________________________________  Should you have questions after your visit to [facility], please contact our office at [phone] and follow the prompts.  Our office hours are 8:00 a.m. and 4:30 p.m. Monday - Friday.  Please note that voicemails left after 4:00 p.m. may not be returned until the following business day.  We are closed weekends and major holidays.  You do have access to a nurse 24-7, just call the main number to the clinic [phone] and do not press any options, hold on the line and a nurse will answer the phone.    For prescription refill requests, have your pharmacy contact our  office and allow 72 hours.    Due to Covid, you will need to wear a mask upon entering the hospital. If you do not have a mask, a mask will be given to you at the Main Entrance upon arrival. For doctor visits, patients may have 1 support person age 19 or older with them. For treatment visits, patients can not have anyone with them due to social distancing guidelines and our immunocompromised population.

## 2021-12-30 NOTE — Patient Instructions (Signed)
[facility]  Discharge Instructions: Thank you for choosing [facility] to provide your oncology and hematology care.  If you have a lab appointment with the [facility], please come in thru the Main Entrance and check in at the main information desk.  Wear comfortable clothing and clothing appropriate for easy access to any Portacath or PICC line.   We strive to give you quality time with your provider. You may need to reschedule your appointment if you arrive late (15 or more minutes).  Arriving late affects you and other patients whose appointments are after yours.  Also, if you miss three or more appointments without notifying the office, you may be dismissed from the clinic at the provider's discretion.      For prescription refill requests, have your pharmacy contact our office and allow 72 hours for refills to be completed.    Today you received the following chemotherapy and/or immunotherapy agents Folfox with pump start      To help prevent nausea and vomiting after your treatment, we encourage you to take your nausea medication as directed.  BELOW ARE SYMPTOMS THAT SHOULD BE REPORTED IMMEDIATELY: *FEVER GREATER THAN 100.4 F (38 C) OR HIGHER *CHILLS OR SWEATING *NAUSEA AND VOMITING THAT IS NOT CONTROLLED WITH YOUR NAUSEA MEDICATION *UNUSUAL SHORTNESS OF BREATH *UNUSUAL BRUISING OR BLEEDING *URINARY PROBLEMS (pain or burning when urinating, or frequent urination) *BOWEL PROBLEMS (unusual diarrhea, constipation, pain near the anus) TENDERNESS IN MOUTH AND THROAT WITH OR WITHOUT PRESENCE OF ULCERS (sore throat, sores in mouth, or a toothache) UNUSUAL RASH, SWELLING OR PAIN  UNUSUAL VAGINAL DISCHARGE OR ITCHING   Items with * indicate a potential emergency and should be followed up as soon as possible or go to the Emergency Department if any problems should occur.  Please show the CHEMOTHERAPY ALERT CARD or IMMUNOTHERAPY ALERT CARD at check-in  to the Emergency Department and triage nurse.  Should you have questions after your visit or need to cancel or reschedule your appointment, please contact [facility] [phone]  and follow the prompts.  Office hours are 8:00 a.m. to 4:30 p.m. Monday - Friday. Please note that voicemails left after 4:00 p.m. may not be returned until the following business day.  We are closed weekends and major holidays. You have access to a nurse at all times for urgent questions. Please call the main number to the clinic [phone] and follow the prompts.  For any non-urgent questions, you may also contact your provider using MyChart. We now offer e-Visits for anyone 15 and older to request care online for non-urgent symptoms. For details visit [email or web address].   Also download the MyChart app! Go to the app store, search "MyChart", open the app, select [place], and log in with your MyChart username and password.  Masks are optional in the cancer centers. If you would like for your care team to wear a mask while they are taking care of you, please let them know. You may have one support person who is at least 71 years old accompany you for your appointments.

## 2021-12-30 NOTE — Progress Notes (Signed)
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   CURRENT THERAPY: FOLFIRINOX every 2 weeks  BRIEF ONCOLOGIC HISTORY:  Oncology History  Pancreatic cancer ([place])  10/07/2021 Initial Diagnosis   Pancreatic cancer ([place])   10/13/2021 - 11/19/2021 Chemotherapy   Patient is on Treatment Plan : PANCREAS Modified FOLFIRINOX q14d x 4 cycles     10/13/2021 -  Chemotherapy   Patient is on Treatment Plan : PANCREAS Modified FOLFIRINOX q14d x 4 cycles       CANCER STAGING:  Cancer Staging  Adenocarcinoma of colon with mucinous features Staging form: Colon and Rectum, AJCC 7th Edition - Clinical: Stage IIIB (T3, N1, M0) - Signed by Baird Cancer, PA on 09/22/2010  Pancreatic cancer [facility]) Staging form: Exocrine Pancreas, AJCC 8th Edition - Clinical stage from 10/07/2021: Stage IIB (cT1, cN1, cM0) - Unsigned    INTERVAL HISTORY:  [name] 71 y.o. male seen for follow-up and toxicity assessment prior to next cycle of chemotherapy for his pancreatic cancer.  Cycle 5 of chemotherapy was on 12/16/2021.  Pain on the left side of the abdomen has improved.  He had some diarrhea after last treatment which was stable.  He lost 5 pounds despite eating the same amount of food.  He is eating 3 meals per day.  Numbness in the feet is slightly worse since he started chemotherapy but stable since last treatment.  He has mild nausea and Compazine helps.   REVIEW OF SYSTEMS:  Review of Systems  Gastrointestinal:  Positive for diarrhea and nausea.  Neurological:  Positive for numbness (feet).  Psychiatric/Behavioral:  Positive for sleep disturbance.   All other systems reviewed and are negative.    PAST MEDICAL/SURGICAL HISTORY:  Past Medical History:  Diagnosis Date    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   Stage 3   Anemia    Anxiety    Arthritis    Barrett's esophagus    Blood transfusion    Bowel obstruction ([place]) 05/13/2012   Recurrent   Bronchitis    Chest pain at rest    Chronic abdominal pain    Erosive esophagitis    ETOH abuse    quit 03/2010   GERD (gastroesophageal reflux disease)    Hx of Clostridium difficile infection 01/2012   Hypertension    Ileus ([place])    Iron deficiency anemia 03/23/2016   Lung cancer ([place])    Obstruction of bowel ([place]) 03/03/2014   Osteoporosis    Personal history of PE (pulmonary embolism) 10/01/2010   Pneumonia    Pulmonary embolism ([place]) 02/2010   Recurrent upper respiratory infection (URI)    Renal disorder    S/P endoscopy 09/28/2010   erosive reflux esophagitis, Billroth I anatomy   S/P partial gastrectomy 1980s   Seizures ([place])    was on meds for 6 months, Unknown etiology, last seizure was 2017.   Shortness of breath    TIA (transient ischemic attack) 12/2009   Vitamin B12 deficiency    Past Surgical History:  Procedure Laterality Date   ABDOMINAL ADHESION SURGERY  03/04/15   @ [facility]   ABDOMINAL EXPLORATION SURGERY     abdominal sugery     for bowel obstruction x 8, all in 1980s, except for one in 07/2010  APPENDECTOMY  1980s   Billroth 1 hemigastrectomy  1980s   per patient for benign duodenal tumor   CARDIAC CATHETERIZATION  07/17/2012   CHOLECYSTECTOMY  1980s   COLON SURGERY  May 2012   left hemicolectomy, colon cancer found at time of surgery for bowel obstruction   COLON SURGERY  10/12/2018   At [facility]: Resection of right colon and terminal ileum with creation of ileorectal anastomosis for large bowel obstruction.  Cecum and ascending colon noted to be attached to the rectum.   COLONOSCOPY  03/18/2011   anastomosis at 35cm. Several adenomatous polyps removed. Sigmoid diverticulosis. Next TCS 02/2013   COLONOSCOPY N/A 07/24/2012   WEX:HBZJIR post segmental resection  with normal-appearing colonic anastomosis aside from an adjacent polyp-removed as described above. Rectal polyp-removed as described above. CT findings appear to have been artifactual. tubular adenomas/prolapsed type polyp.   COLONOSCOPY N/A 05/15/2015   Procedure: COLONOSCOPY;  Surgeon: [name], MD;  [facility];  Service: Endoscopy;  Laterality: N/A;   COLONOSCOPY  09/26/2018   at [facility]: prep not adequate for colon cancer surveillance.  Prior end-to-end colonic anastomosis in the rectosigmoid region.  This was patent and characterized by healthy-appearing mucosa.  Anastomosis was traversed.  Normal terminal ileum.   COLONOSCOPY WITH PROPOFOL N/A 11/04/2016   Dr. [name], status post subtotal colectomy with normal-appearing residual lower GI tract.  Next colonoscopy in 5 years.   ESOPHAGOGASTRODUODENOSCOPY  09/28/2010   ESOPHAGOGASTRODUODENOSCOPY  12/01/2010   Cervical web status post dilation, erosive esophagitis, B1 hemigastrectomy, inflamed anastomosis   ESOPHAGOGASTRODUODENOSCOPY  04/16/2011   excoriation at [facility] c/w trauma/M-W tear, friable gastric anastomosis, dilation efferent limb   ESOPHAGOGASTRODUODENOSCOPY N/A 06/03/2014   Dr.[name]- cervcal esopphageal web s/p dilation. abnormal distal esophagus bx= barretts esophagus   ESOPHAGOGASTRODUODENOSCOPY N/A 05/15/2015   Procedure: ESOPHAGOGASTRODUODENOSCOPY (EGD);  Surgeon: [name], MD;  [facility];  Service: Endoscopy;  Laterality: N/A;  230   ESOPHAGOGASTRODUODENOSCOPY (EGD) WITH ESOPHAGEAL DILATION  02/25/2012   CVE:LFYBOFBP esophageal web-s/p dilation anddisruption as described above. Status post prior gastric with Billroth I configuration. Abnormal gastric mucosa at the anastomosis. Gastric biopsy showed mild chronic inflammation but no H. pylori    ESOPHAGOGASTRODUODENOSCOPY (EGD) WITH PROPOFOL N/A 11/04/2016   Dr. [name]: Reflux esophagitis, small hiatal hernia status post hemigastrectomy.  Single patent  efferent small bowel limb appeared normal, 2 x 2 centimeter tongue of salmon epithelium again seen, esophageal dilation.  Biopsies consistent with reflux changes, not Barrett's however this was confirmed on prior EGDs.  Offer 3-year follow-up EGD August 2021.   ESOPHAGOGASTRODUODENOSCOPY (EGD) WITH PROPOFOL N/A 03/03/2020   Procedure: ESOPHAGOGASTRODUODENOSCOPY (EGD) WITH PROPOFOL;  Surgeon: [name], MD;  [facility];  Service: Endoscopy;  Laterality: N/A;  3:00pm   HERNIA REPAIR     right inguinal   MALONEY DILATION N/A 06/03/2014   Procedure: [name] DILATION;  Surgeon: [name], MD;  [facility];  Service: Endoscopy;  Laterality: N/A;   MALONEY DILATION N/A 05/15/2015   Procedure: [name] DILATION;  Surgeon: [name], MD;  [facility];  Service: Endoscopy;  Laterality: N/A;   MALONEY DILATION N/A 11/04/2016   Procedure: [name] DILATION;  Surgeon: [name], MD;  [facility];  Service: Endoscopy;  Laterality: N/A;   MALONEY DILATION N/A 03/03/2020   Procedure: [name] DILATION;  Surgeon: [name], MD;  [facility];  Service: Endoscopy;  Laterality: N/A;   PORTACATH PLACEMENT  SAVORY DILATION N/A 06/03/2014   Procedure: SAVORY DILATION;  Surgeon: [name], MD;  [facility];  Service: Endoscopy;  Laterality: N/A;     SOCIAL HISTORY:  Social History   Socioeconomic History   Marital status: Married    Spouse name: Not on file   Number of children: 3   Years of education: Not on file   Highest education level: Not on file  Occupational History    Employer: [employer]  Tobacco Use   Smoking status: Former    Packs/day: 0.50    Years: 40.00    Total pack years: 20.00    Types: Cigarettes    Quit date: 12/20/2012    Years since quitting: 9.0   Smokeless tobacco: Never  Vaping Use   Vaping Use: Never used  Substance and Sexual Activity   Alcohol use: No   Drug use: No    Sexual activity: Never  Other Topics Concern   Not on file  Social History Narrative   Not on file   Social Determinants of Health   Financial Resource Strain: Not on file  Food Insecurity: Not on file  Transportation Needs: Not on file  Physical Activity: Not on file  Stress: Not on file  Social Connections: Not on file  Intimate Partner Violence: Not on file    FAMILY HISTORY:  Family History  Problem Relation Age of Onset   Hypertension Mother    Arthritis Mother    Pneumonia Mother    Hypertension Father    Heart attack Father    Colon cancer Neg Hx     CURRENT MEDICATIONS:  Outpatient Encounter Medications as of 12/30/2021  Medication Sig   ammonium lactate (AMLACTIN) 12 % cream Apply 1 g topically See admin instructions. APPLY CREAM TO DRY SKIN ON LEGS ARMS HANDS AND BACK AREA ONCE TO TWICE DAILY (AVOID FACE GROIN UNDERARMS)   apixaban (ELIQUIS) 5 MG TABS tablet Take 1 tablet (5 mg total) by mouth 2 (two) times daily.   atorvastatin (LIPITOR) 20 MG tablet Take by mouth.   Bacillus Coagulans-Inulin (PROBIOTIC) 1-250 BILLION-MG CAPS Take by mouth.   baclofen (LIORESAL) 10 MG tablet as needed.   carvedilol (COREG) 25 MG tablet Take 0.5 tablets (12.5 mg total) by mouth 2 (two) times daily with a meal. (Patient taking differently: Take 25 mg by mouth 2 (two) times daily with a meal. Takes 25 mg 2 times daily.)   Cholecalciferol (VITAMIN D) 2000 units CAPS Take 2,000 Units by mouth daily.   Cholecalciferol (VITAMIN D3) 10 MCG (400 UNIT) tablet Take by mouth.   CREON 36000-114000 units CPEP capsule Take by mouth.   cyanocobalamin 100 MCG tablet Take by mouth.   diclofenac Sodium (VOLTAREN) 1 % GEL Apply topically.   diphenoxylate-atropine (LOMOTIL) 2.5-0.025 MG tablet Take 1 tablet by mouth 4 times daily   ferrous sulfate 325 (65 FE) MG EC tablet Take by mouth.   gabapentin (NEURONTIN) 100 MG capsule TAKE 1 CAPSULE BY MOUTH EVERY DAY AT BEDTIME    HYDROcodone-acetaminophen (NORCO/VICODIN) 5-325 MG tablet Take 1 tablet by mouth every 8 (eight) hours as needed for moderate pain.   loperamide (IMODIUM A-D) 2 MG tablet Take 2 mg by mouth in the morning and at bedtime.   magnesium oxide (MAG-OX) 400 (240 Mg) MG tablet Take 1 tablet (400 mg total) by mouth 2 (two) times daily.   mirtazapine (REMERON) 30 MG tablet TAKE 1 TABLET BY MOUTH AT BEDTIME   Multiple  Vitamin (MULTIVITAMIN WITH MINERALS) TABS tablet Take 1 tablet by mouth daily.   ondansetron (ZOFRAN-ODT) 4 MG disintegrating tablet TAKE 1 TABLET BY MOUTH EVERY 4 HOURS AS NEEDED FOR NAUSEA AND VOMITING   pantoprazole (PROTONIX) 40 MG tablet Take 1 tablet (40 mg total) by mouth 2 (two) times daily before a meal.   PREVALITE 4 g packet Take 1 packet (4 g total) by mouth daily. DO NOT TAKE WITHIN 2 HOURS OF OTHER MEDICATIONS   prochlorperazine (COMPAZINE) 10 MG tablet Take by mouth.   sucralfate (CARAFATE) 1 GM/10ML suspension TAKE 10 MLS (1 G TOTAL) BY MOUTH 4 (FOUR) TIMES DAILY AS NEEDED. FOR BREAKTHROUGH HEARTBURN   traMADol (ULTRAM) 50 MG tablet Take 1 tablet by mouth every 6 (six) hours as needed.   [DISCONTINUED] lidocaine-prilocaine (EMLA) cream Apply to port site 1 hour prior to port access   Facility-Administered Encounter Medications as of 12/30/2021  Medication   acetaminophen (TYLENOL) 325 MG tablet   heparin lock flush 100 unit/mL   loratadine (CLARITIN) 10 MG tablet   sodium chloride flush (NS) 0.9 % injection 10 mL    ALLERGIES:  No Known Allergies   PHYSICAL EXAM:  ECOG Performance status: 1  There were no vitals filed for this visit. There were no vitals filed for this visit. Physical Exam Vitals reviewed.  Constitutional:      Appearance: Normal appearance.  Cardiovascular:     Rate and Rhythm: Normal rate and regular rhythm.     Heart sounds: Normal heart sounds.  Pulmonary:     Breath sounds: Normal breath sounds.  Abdominal:     Palpations: Abdomen is  soft. There is no mass.  Neurological:     General: No focal deficit present.     Mental Status: He is alert and oriented to person, place, and time.  Psychiatric:        Mood and Affect: Mood normal.        Behavior: Behavior normal.      LABORATORY DATA:  I have reviewed the labs as listed.  CBC    Component Value Date/Time   WBC 35.1 (H) 12/16/2021 0750   RBC 2.63 (L) 12/16/2021 0750   HGB 8.3 (L) 12/16/2021 0750   HCT 25.0 (L) 12/16/2021 0750   PLT 139 (L) 12/16/2021 0750   MCV 95.1 12/16/2021 0750   MCH 31.6 12/16/2021 0750   MCHC 33.2 12/16/2021 0750   RDW 20.3 (H) 12/16/2021 0750   LYMPHSABS 3.0 12/16/2021 0750   MONOABS 1.7 (H) 12/16/2021 0750   EOSABS 0.1 12/16/2021 0750   BASOSABS 0.1 12/16/2021 0750      Latest Ref Rng & Units 12/16/2021    7:50 AM 12/01/2021    8:36 AM 11/17/2021    8:07 AM  CMP  Glucose 70 - 99 mg/dL 111  92  97   BUN 8 - 23 mg/dL 19  23  13    Creatinine 0.61 - 1.24 mg/dL 1.62  1.46  1.21   Sodium 135 - 145 mmol/L 136  136  136   Potassium 3.5 - 5.1 mmol/L 4.4  4.1  4.3   Chloride 98 - 111 mmol/L 111  108  111   CO2 22 - 32 mmol/L 17  21  21    Calcium 8.9 - 10.3 mg/dL 7.2  7.4  7.6   Total Protein 6.5 - 8.1 g/dL 4.9  5.6  5.2   Total Bilirubin 0.3 - 1.2 mg/dL 0.6  0.4  0.6   Alkaline  Phos 38 - 126 U/L 161  158  103   AST 15 - 41 U/L 18  14  18    ALT 0 - 44 U/L 13  14  13      DIAGNOSTIC IMAGING:  I have independently reviewed the scans and discussed with the patient.  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  No lymphadenopathy. - EUS and FNA (09/21/2021): Oval mass in the pancreatic tail, hypoechoic, 20 mm x 17 mm.  Remainder of the pancreas shows decreased visualization of the pancreas. - Pathology: Invasive adenocarcinoma, ductal type, moderately to poorly differentiated.  Background  desmoplastic stroma, blood and pancreatic tissue.  Morphologically distinct from prior lung adenocarcinoma.  TTF-1 and Napsin A were negative. - Patient evaluated by Dr. Dayton Scrape at [facility] for pancreatic resection. - CA 19-9 (10/02/2021): 6.47 - PET CT scan (10/02/2021): Ill-defined 4 cm mass in the left upper quadrant in the region of the pancreatic tail with mild hypermetabolism, SUV 4.7.  Soft tissue nodule in the gastrohepatic ligament measuring 2 x 1.3 cm, SUV 4.0.  Suspicious for metastatic lymphadenopathy.  9 mm mediastinal lymph node and AP window is hypermetabolic SUV 4.9.  Borderline enlarged left hilar lymph nodes show hypermetabolic activity SUV 3.4.  Previously seen subpleural nodule in the superior right lower lobe has nearly completely resolved.  Overall mild hypermetabolic mediastinal and left hilar lymph nodes which are nonspecific.  May be reactive in etiology although metastatic disease cannot definitely be excluded.        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 -Last EGD/colonoscopy on 05/15/2015 was normal. - CT scan in October 2018 was negative for metastatic disease. -Bowel resection in July last year secondary to small bowel obstruction at [facility]   Stage I adenocarcinoma of the left lung: - Pathology (01/22/2021): 1.6 cm moderately differentiated adenocarcinoma, TTF-1 and Napsin positive, negative for CK20.  Margins negative.  2 hilar lymph nodes were negative. - No adjuvant chemotherapy was needed.    Social/family history: - Lives at home with his wife.  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  No chemical exposure.  Quit smoking in 2014 and smoked 1 pack/day for 44 years. - Paternal cousin has bone cancer.    PLAN:   Stage IIB adenocarcinoma of the pancreatic  tail: - Cycle 5 of FOLFIRINOX on 12/16/2021. - He has tolerated it fairly well.  Occasional nausea controlled with Compazine.  Diarrhea is stable. - Tumor marker CA 19-9 was undetectable on 12/16/2021. - Proceed with cycle 6 today without any dose modifications. - RTC 2 weeks with repeat PET scan to evaluate response.  We will reach out to his surgeon at [facility] after the PET scan.  2.  Abdominal pain: - Left upper quadrant pain has improved since the start of chemotherapy.   3. Iron deficiency: - Feraheme completed on 11/17/2021. - Ferritin is 958 and percent saturation 56.  Hemoglobin today is 8.0, from myelosuppression.   4. 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  Closely monitor.  5.  Chronic diarrhea: - Diarrhea is stable.  Continue Imodium 1  to 2 tablets twice daily.  6.  Weight loss: - He has lost 5 pounds since last visit.  However he is eating better.  He is eating 3 meals per day.        Orders placed this encounter:  Orders Placed This Encounter  Procedures   CEA   Cancer antigen 19-9       Derek Jack, MD [place] [phone]

## 2021-12-30 NOTE — Progress Notes (Signed)
Patient presents today for Folfirinox with pump start per providers order.  Vital signs within parameters for treatment.  Labs pending.    Labs reviewed by the MD, Message received from Anastasio Champion RN/Dr. Delton Coombes patient okay for treatment.  Treatment given today per MD orders.  Stable during infusion without adverse affects.  Vital signs stable.  5FU pump connected and verified RUN on the screen with the patient.  No complaints at this time.  Discharge from clinic ambulatory in stable condition.  Alert and oriented X 3.  Follow up with [facility] as scheduled.

## 2021-12-31 ENCOUNTER — Other Ambulatory Visit: Payer: Self-pay

## 2021-12-31 ENCOUNTER — Inpatient Hospital Stay: Payer: Medicare Other | Admitting: Licensed Clinical Social Worker

## 2021-12-31 ENCOUNTER — Encounter: Payer: Self-pay | Admitting: Licensed Clinical Social Worker

## 2021-12-31 DIAGNOSIS — Z85118 Personal history of other malignant neoplasm of bronchus and lung: Secondary | ICD-10-CM

## 2021-12-31 DIAGNOSIS — C252 Malignant neoplasm of tail of pancreas: Secondary | ICD-10-CM

## 2021-12-31 DIAGNOSIS — Z85038 Personal history of other malignant neoplasm of large intestine: Secondary | ICD-10-CM

## 2021-12-31 NOTE — Progress Notes (Signed)
REFERRING PROVIDER: Derek Jack, MD [address] [place],  [place] [address]  PRIMARY PROVIDER:  [name], MD  PRIMARY REASON FOR VISIT:  1. 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 History of colon cancer, stage III   3. Personal history of lung cancer    I connected with [name] on 12/31/2021 at 11:00 AM EDT by MyChart video conference and verified that I am speaking with the correct person using two identifiers.    Patient location: home Provider location: [place]:   [name], a 71 y.o. male, was seen for a [facility] cancer genetics consultation at the request of Dr. Delton Coombes due to a personal history of cancer.  [name] presents to clinic today to discuss the possibility of a hereditary predisposition to cancer, genetic testing, and to further clarify his future cancer risks, as well as potential cancer risks for family members.   CANCER HISTORY:  Oncology History  Pancreatic cancer ([place])  10/07/2021 Initial Diagnosis   Pancreatic cancer ([place])   10/13/2021 - 11/19/2021 Chemotherapy   Patient is on Treatment Plan : PANCREAS Modified FOLFIRINOX q14d x 4 cycles     10/13/2021 -  Chemotherapy   Patient is on Treatment Plan : PANCREAS Modified FOLFIRINOX q14d x 4 cycles      In 2012, at the age of 69, [name] was diagnosed with colon cancer. This was treated with surgery and chemotherapy. In 2022, at the age of 46, he was diagnosed with lung cancer. In 2023, at the age of 66, [name] was diagnosed with pancreatic cancer. The treatment plan includes chemotherapy.   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   Stage 3   Anemia    Anxiety    Arthritis    Barrett's esophagus    Blood transfusion    Bowel obstruction ([place]) 05/13/2012   Recurrent   Bronchitis    Chest pain at rest    Chronic abdominal pain    Erosive esophagitis    ETOH abuse     quit 03/2010   GERD (gastroesophageal reflux disease)    Hx of Clostridium difficile infection 01/2012   Hypertension    Ileus ([place])    Iron deficiency anemia 03/23/2016   Lung cancer ([place])    Obstruction of bowel ([place]) 03/03/2014   Osteoporosis    Personal history of PE (pulmonary embolism) 10/01/2010   Pneumonia    Pulmonary embolism ([place]) 02/2010   Recurrent upper respiratory infection (URI)    Renal disorder    S/P endoscopy 09/28/2010   erosive reflux esophagitis, Billroth I anatomy   S/P partial gastrectomy 1980s   Seizures ([place])    was on meds for 6 months, Unknown etiology, last seizure was 2017.   Shortness of breath    TIA (transient ischemic attack) 12/2009   Vitamin B12 deficiency     Past Surgical History:  Procedure Laterality Date   ABDOMINAL ADHESION SURGERY  03/04/15   @ [facility]   ABDOMINAL EXPLORATION SURGERY     abdominal sugery     for bowel obstruction x 8, all in 1980s, except for one in 07/2010   APPENDECTOMY  1980s   Billroth 1 hemigastrectomy  1980s   per patient for benign duodenal tumor   CARDIAC CATHETERIZATION  07/17/2012   CHOLECYSTECTOMY  1980s   COLON SURGERY  May 2012   left hemicolectomy, colon cancer found  at time of surgery for bowel obstruction   COLON SURGERY  10/12/2018   At [facility]: Resection of right colon and terminal ileum with creation of ileorectal anastomosis for large bowel obstruction.  Cecum and ascending colon noted to be attached to the rectum.   COLONOSCOPY  03/18/2011   anastomosis at 35cm. Several adenomatous polyps removed. Sigmoid diverticulosis. Next TCS 02/2013   COLONOSCOPY N/A 07/24/2012   RSW:NIOEVO post segmental resection with normal-appearing colonic anastomosis aside from an adjacent polyp-removed as described above. Rectal polyp-removed as described above. CT findings appear to have been artifactual. tubular adenomas/prolapsed type polyp.   COLONOSCOPY N/A 05/15/2015   Procedure: COLONOSCOPY;  Surgeon: [name],  MD;  [facility];  Service: Endoscopy;  Laterality: N/A;   COLONOSCOPY  09/26/2018   at [facility]: prep not adequate for colon cancer surveillance.  Prior end-to-end colonic anastomosis in the rectosigmoid region.  This was patent and characterized by healthy-appearing mucosa.  Anastomosis was traversed.  Normal terminal ileum.   COLONOSCOPY WITH PROPOFOL N/A 11/04/2016   Dr. [name], status post subtotal colectomy with normal-appearing residual lower GI tract.  Next colonoscopy in 5 years.   ESOPHAGOGASTRODUODENOSCOPY  09/28/2010   ESOPHAGOGASTRODUODENOSCOPY  12/01/2010   Cervical web status post dilation, erosive esophagitis, B1 hemigastrectomy, inflamed anastomosis   ESOPHAGOGASTRODUODENOSCOPY  04/16/2011   excoriation at [facility] c/w trauma/M-W tear, friable gastric anastomosis, dilation efferent limb   ESOPHAGOGASTRODUODENOSCOPY N/A 06/03/2014   Dr.[name]- cervcal esopphageal web s/p dilation. abnormal distal esophagus bx= barretts esophagus   ESOPHAGOGASTRODUODENOSCOPY N/A 05/15/2015   Procedure: ESOPHAGOGASTRODUODENOSCOPY (EGD);  Surgeon: [name], MD;  [facility];  Service: Endoscopy;  Laterality: N/A;  230   ESOPHAGOGASTRODUODENOSCOPY (EGD) WITH ESOPHAGEAL DILATION  02/25/2012   JJK:KXFGHWEX esophageal web-s/p dilation anddisruption as described above. Status post prior gastric with Billroth I configuration. Abnormal gastric mucosa at the anastomosis. Gastric biopsy showed mild chronic inflammation but no H. pylori    ESOPHAGOGASTRODUODENOSCOPY (EGD) WITH PROPOFOL N/A 11/04/2016   Dr. [name]: Reflux esophagitis, small hiatal hernia status post hemigastrectomy.  Single patent efferent small bowel limb appeared normal, 2 x 2 centimeter tongue of salmon epithelium again seen, esophageal dilation.  Biopsies consistent with reflux changes, not Barrett's however this was confirmed on prior EGDs.  Offer 3-year follow-up EGD August 2021.   ESOPHAGOGASTRODUODENOSCOPY (EGD) WITH PROPOFOL N/A  03/03/2020   Procedure: ESOPHAGOGASTRODUODENOSCOPY (EGD) WITH PROPOFOL;  Surgeon: [name], MD;  [facility];  Service: Endoscopy;  Laterality: N/A;  3:00pm   HERNIA REPAIR     right inguinal   MALONEY DILATION N/A 06/03/2014   Procedure: [name] DILATION;  Surgeon: [name], MD;  [facility];  Service: Endoscopy;  Laterality: N/A;   MALONEY DILATION N/A 05/15/2015   Procedure: [name] DILATION;  Surgeon: [name], MD;  [facility];  Service: Endoscopy;  Laterality: N/A;   MALONEY DILATION N/A 11/04/2016   Procedure: [name] DILATION;  Surgeon: [name], MD;  [facility];  Service: Endoscopy;  Laterality: N/A;   MALONEY DILATION N/A 03/03/2020   Procedure: [name] DILATION;  Surgeon: [name], MD;  [facility];  Service: Endoscopy;  Laterality: N/A;   PORTACATH PLACEMENT     SAVORY DILATION N/A 06/03/2014   Procedure: SAVORY DILATION;  Surgeon: [name], MD;  [facility];  Service: Endoscopy;  Laterality: N/A;    FAMILY HISTORY:  We obtained a detailed, 4-generation family history.  Significant diagnoses are listed below: Family History  Problem Relation Age of Onset   Hypertension Mother    Arthritis Mother    Pneumonia Mother    Hypertension Father    Heart attack Father    Bone cancer Cousin    Colon cancer Neg Hx    Mr. Lehrke had 2 daughters and 1 son, 1 daughter has passed. He has 1 sister, no cancer history.  Mr. Schwinn mother passed at 3. No known cancers on this side of the family.  Mr. Denherder father passed at 50. A paternal cousin had bone cancer. No other known cancers on this side of the family.  Mr. Pringle is unaware of previous family history of genetic testing for hereditary cancer risks. There is no reported Ashkenazi Jewish ancestry. There is no known consanguinity.    GENETIC COUNSELING ASSESSMENT: Mr. Fesperman is a 71 y.o. male with a personal history  of multiple cancers including colon and pancreatic, which is somewhat suggestive of a hereditary cancer syndrome and predisposition to cancer. We, therefore, discussed and recommended the following at today's visit.   DISCUSSION: We discussed that approximately 10% of pancreatic cancer is hereditary. Most cases of hereditary pancreatic cancer are associated with BRCA1/BRCA2 genes, although there are other genes associated with hereditary cancer as well including Lynch syndrome genes which increase risk for colon cancer. Cancers and risks are gene specific. We discussed that testing is beneficial for several reasons including knowing about cancer risks, identifying potential screening and risk-reduction options that may be appropriate, and to understand if other family members could be at risk for cancer and allow them to undergo genetic testing.   We reviewed the characteristics, features and inheritance patterns of hereditary cancer syndromes. We also discussed genetic testing, including the appropriate family members to test, the process of testing, insurance coverage and turn-around-time for results. We discussed the implications of a negative, positive and/or variant of uncertain significant result. We recommended Mr. Zietz pursue genetic testing for the [facility] Multi-Cancer+RNA gene panel.   Based on Mr. Doell personal history of cancer, he meets medical criteria for genetic testing. Despite that he meets criteria, he may still have an out of pocket cost. We discussed that if his out of pocket cost for testing is over $100, the laboratory will call and confirm whether he wants to proceed with testing.  If the out of pocket cost of testing is less than $100 he will be billed by the genetic testing laboratory.   PLAN: After considering the risks, benefits, and limitations, Mr. Cuevas provided informed consent to pursue genetic testing and the blood sample was sent to [facility] for analysis  of the Multi-Cancer+RNA panel. Results should be available within approximately 2-3 weeks' time, at which point they will be disclosed by telephone to Mr. Tamargo, as will any additional recommendations warranted by these results. Mr. Pollio will receive a summary of his genetic counseling visit and a copy of his results once available. This information will also be available in Epic.   Mr. Zepeda questions were answered to his satisfaction today. Our contact information was provided should additional questions or concerns arise. Thank you for the referral and allowing [place] to share in the care of your patient.   Faith Rogue, MS, [facility] Genetic Counselor [email or web address] Phone: [phone]  The patient was seen for a total of 18 minutes in virtual genetic counseling.  Dr. Grayland Ormond was available for discussion regarding this case.   _______________________________________________________________________ For Office Staff:  Number  of people involved in session: 1 Was an Intern/ student involved with case: no

## 2022-01-01 ENCOUNTER — Encounter: Payer: Self-pay | Admitting: Hematology

## 2022-01-01 ENCOUNTER — Inpatient Hospital Stay: Payer: Medicare Other

## 2022-01-01 VITALS — BP 104/59 | HR 86 | Temp 97.8°F | Resp 18

## 2022-01-01 DIAGNOSIS — Z5111 Encounter for antineoplastic chemotherapy: Secondary | ICD-10-CM | POA: Diagnosis not present

## 2022-01-01 DIAGNOSIS — C252 Malignant neoplasm of tail of pancreas: Secondary | ICD-10-CM

## 2022-01-01 LAB — CEA: CEA: 3.4 ng/mL (ref 0.0–4.7)

## 2022-01-01 LAB — CANCER ANTIGEN 19-9: CA 19-9: <2 U/mL (ref 0–35)

## 2022-01-01 MED ORDER — CHLORPROMAZINE HCL 25 MG PO TABS: 25.0000 mg | ORAL_TABLET | Freq: Four times a day (QID) | ORAL | 0 refills | Status: DC | PRN

## 2022-01-01 MED ORDER — PEGFILGRASTIM-CBQV 6 MG/0.6ML ~~LOC~~ SOSY
6.0000 mg | PREFILLED_SYRINGE | Freq: Once | SUBCUTANEOUS | Status: AC
  Administered 2022-01-01: 6 mg via SUBCUTANEOUS
  Filled 2022-01-01: qty 0.6

## 2022-01-01 MED ORDER — SODIUM CHLORIDE 0.9% FLUSH
10.0000 mL | INTRAVENOUS | Status: DC | PRN
  Administered 2022-01-01: 10 mL

## 2022-01-01 MED ORDER — HEPARIN SOD (PORK) LOCK FLUSH 100 UNIT/ML IV SOLN
500.0000 [IU] | Freq: Once | INTRAVENOUS | Status: AC | PRN
  Administered 2022-01-01: 500 [IU]

## 2022-01-01 NOTE — Patient Instructions (Signed)
[facility]  Discharge Instructions: Thank you for choosing [facility] to provide your oncology and hematology care.  If you have a lab appointment with the Cancer Center, please come in thru the Main Entrance and check in at the main information desk.  Wear comfortable clothing and clothing appropriate for easy access to any Portacath or PICC line.   We strive to give you quality time with your provider. You may need to reschedule your appointment if you arrive late (15 or more minutes).  Arriving late affects you and other patients whose appointments are after yours.  Also, if you miss three or more appointments without notifying the office, you may be dismissed from the clinic at the provider's discretion.      For prescription refill requests, have your pharmacy contact our office and allow 72 hours for refills to be completed.     To help prevent nausea and vomiting after your treatment, we encourage you to take your nausea medication as directed.  BELOW ARE SYMPTOMS THAT SHOULD BE REPORTED IMMEDIATELY: *FEVER GREATER THAN 100.4 F (38 C) OR HIGHER *CHILLS OR SWEATING *NAUSEA AND VOMITING THAT IS NOT CONTROLLED WITH YOUR NAUSEA MEDICATION *UNUSUAL SHORTNESS OF BREATH *UNUSUAL BRUISING OR BLEEDING *URINARY PROBLEMS (pain or burning when urinating, or frequent urination) *BOWEL PROBLEMS (unusual diarrhea, constipation, pain near the anus) TENDERNESS IN MOUTH AND THROAT WITH OR WITHOUT PRESENCE OF ULCERS (sore throat, sores in mouth, or a toothache) UNUSUAL RASH, SWELLING OR PAIN  UNUSUAL VAGINAL DISCHARGE OR ITCHING   Items with * indicate a potential emergency and should be followed up as soon as possible or go to the Emergency Department if any problems should occur.  Please show the CHEMOTHERAPY ALERT CARD or IMMUNOTHERAPY ALERT CARD at check-in to the Emergency Department and triage nurse.  Should you have questions after your visit or need to  cancel or reschedule your appointment, please contact [facility] [phone]  and follow the prompts.  Office hours are 8:00 a.m. to 4:30 p.m. Monday - Friday. Please note that voicemails left after 4:00 p.m. may not be returned until the following business day.  We are closed weekends and major holidays. You have access to a nurse at all times for urgent questions. Please call the main number to the clinic [phone] and follow the prompts.  For any non-urgent questions, you may also contact your provider using MyChart. We now offer e-Visits for anyone 18 and older to request care online for non-urgent symptoms. For details visit [email or web address].   Also download the MyChart app! Go to the app store, search "MyChart", open the app, select [facility], and log in with your MyChart username and password.  Masks are optional in the cancer centers. If you would like for your care team to wear a mask while they are taking care of you, please let them know. You may have one support person who is at least 71 years old accompany you for your appointments.  

## 2022-01-01 NOTE — Progress Notes (Signed)
Labs drawn for [facility] genetic testing.    Patient for chemotherapy pump disconnect and Injection with no complaints voiced.  Patients port flushed without difficulty.  Good blood return noted with no bruising or swelling noted at site.  Band aid applied.  VSS with discharge and left ambulatory with no s/s of distress noted.

## 2022-01-06 ENCOUNTER — Ambulatory Visit (HOSPITAL_COMMUNITY)
Admission: RE | Admit: 2022-01-06 | Discharge: 2022-01-06 | Disposition: A | Discharge status: Home / Self Care | Payer: Medicare Other | Source: Ambulatory Visit | Attending: Hematology | Admitting: Hematology

## 2022-01-06 ENCOUNTER — Other Ambulatory Visit: Payer: Self-pay | Admitting: Hematology

## 2022-01-06 DIAGNOSIS — C252 Malignant neoplasm of tail of pancreas: Secondary | ICD-10-CM

## 2022-01-06 DIAGNOSIS — D500 Iron deficiency anemia secondary to blood loss (chronic): Secondary | ICD-10-CM | POA: Diagnosis not present

## 2022-01-06 MED ORDER — GADOBUTROL 1 MMOL/ML IV SOLN
5.0000 mL | Freq: Once | INTRAVENOUS | Status: AC | PRN
  Administered 2022-01-06: 5 mL via INTRAVENOUS

## 2022-01-07 ENCOUNTER — Encounter (HOSPITAL_COMMUNITY): Payer: Self-pay

## 2022-01-07 ENCOUNTER — Ambulatory Visit (HOSPITAL_COMMUNITY)
Admission: RE | Admit: 2022-01-07 | Discharge: 2022-01-07 | Disposition: A | Discharge status: Home / Self Care | Payer: Medicare Other | Source: Ambulatory Visit | Attending: Hematology | Admitting: Hematology

## 2022-01-07 DIAGNOSIS — C252 Malignant neoplasm of tail of pancreas: Secondary | ICD-10-CM | POA: Insufficient documentation

## 2022-01-08 ENCOUNTER — Other Ambulatory Visit: Payer: Self-pay

## 2022-01-08 ENCOUNTER — Emergency Department (HOSPITAL_COMMUNITY): Payer: Medicare Other

## 2022-01-08 ENCOUNTER — Inpatient Hospital Stay (HOSPITAL_COMMUNITY)
Admission: EM | Admit: 2022-01-08 | Discharge: 2022-01-10 | DRG: 811 | Disposition: A | Discharge status: Home / Self Care | Payer: Medicare Other | Attending: Internal Medicine | Admitting: Internal Medicine

## 2022-01-08 ENCOUNTER — Encounter (HOSPITAL_COMMUNITY): Payer: Self-pay

## 2022-01-08 DIAGNOSIS — Z87891 Personal history of nicotine dependence: Secondary | ICD-10-CM

## 2022-01-08 DIAGNOSIS — D649 Anemia, unspecified: Secondary | ICD-10-CM | POA: Diagnosis present

## 2022-01-08 DIAGNOSIS — Z8249 Family history of ischemic heart disease and other diseases of the circulatory system: Secondary | ICD-10-CM

## 2022-01-08 DIAGNOSIS — M810 Age-related osteoporosis without current pathological fracture: Secondary | ICD-10-CM | POA: Diagnosis present

## 2022-01-08 DIAGNOSIS — D500 Iron deficiency anemia secondary to blood loss (chronic): Principal | ICD-10-CM | POA: Diagnosis present

## 2022-01-08 DIAGNOSIS — Z86711 Personal history of pulmonary embolism: Secondary | ICD-10-CM | POA: Diagnosis present

## 2022-01-08 DIAGNOSIS — N183 Chronic kidney disease, stage 3 unspecified: Secondary | ICD-10-CM | POA: Diagnosis present

## 2022-01-08 DIAGNOSIS — Z79899 Other long term (current) drug therapy: Secondary | ICD-10-CM

## 2022-01-08 DIAGNOSIS — I130 Hypertensive heart and chronic kidney disease with heart failure and stage 1 through stage 4 chronic kidney disease, or unspecified chronic kidney disease: Secondary | ICD-10-CM | POA: Diagnosis present

## 2022-01-08 DIAGNOSIS — Z903 Acquired absence of stomach [part of]: Secondary | ICD-10-CM

## 2022-01-08 DIAGNOSIS — Z8719 Personal history of other diseases of the digestive system: Secondary | ICD-10-CM

## 2022-01-08 DIAGNOSIS — C259 Malignant neoplasm of pancreas, unspecified: Secondary | ICD-10-CM | POA: Diagnosis present

## 2022-01-08 DIAGNOSIS — Z9049 Acquired absence of other specified parts of digestive tract: Secondary | ICD-10-CM

## 2022-01-08 DIAGNOSIS — G8929 Other chronic pain: Secondary | ICD-10-CM | POA: Diagnosis present

## 2022-01-08 DIAGNOSIS — E538 Deficiency of other specified B group vitamins: Secondary | ICD-10-CM | POA: Diagnosis present

## 2022-01-08 DIAGNOSIS — R197 Diarrhea, unspecified: Secondary | ICD-10-CM | POA: Diagnosis present

## 2022-01-08 DIAGNOSIS — F419 Anxiety disorder, unspecified: Secondary | ICD-10-CM | POA: Diagnosis present

## 2022-01-08 DIAGNOSIS — Z86010 Personal history of colonic polyps: Secondary | ICD-10-CM

## 2022-01-08 DIAGNOSIS — D61810 Antineoplastic chemotherapy induced pancytopenia: Secondary | ICD-10-CM | POA: Diagnosis present

## 2022-01-08 DIAGNOSIS — Z8619 Personal history of other infectious and parasitic diseases: Secondary | ICD-10-CM | POA: Diagnosis present

## 2022-01-08 DIAGNOSIS — D509 Iron deficiency anemia, unspecified: Secondary | ICD-10-CM | POA: Diagnosis present

## 2022-01-08 DIAGNOSIS — T451X5A Adverse effect of antineoplastic and immunosuppressive drugs, initial encounter: Secondary | ICD-10-CM | POA: Diagnosis present

## 2022-01-08 DIAGNOSIS — Z7901 Long term (current) use of anticoagulants: Secondary | ICD-10-CM

## 2022-01-08 DIAGNOSIS — R195 Other fecal abnormalities: Secondary | ICD-10-CM

## 2022-01-08 DIAGNOSIS — I10 Essential (primary) hypertension: Secondary | ICD-10-CM | POA: Diagnosis present

## 2022-01-08 DIAGNOSIS — I5032 Chronic diastolic (congestive) heart failure: Secondary | ICD-10-CM | POA: Diagnosis present

## 2022-01-08 DIAGNOSIS — K922 Gastrointestinal hemorrhage, unspecified: Secondary | ICD-10-CM | POA: Diagnosis present

## 2022-01-08 DIAGNOSIS — Z8673 Personal history of transient ischemic attack (TIA), and cerebral infarction without residual deficits: Secondary | ICD-10-CM

## 2022-01-08 DIAGNOSIS — K2100 Gastro-esophageal reflux disease with esophagitis, without bleeding: Secondary | ICD-10-CM | POA: Diagnosis present

## 2022-01-08 DIAGNOSIS — Z85118 Personal history of other malignant neoplasm of bronchus and lung: Secondary | ICD-10-CM

## 2022-01-08 DIAGNOSIS — Z85038 Personal history of other malignant neoplasm of large intestine: Secondary | ICD-10-CM | POA: Diagnosis present

## 2022-01-08 HISTORY — DX: Malignant neoplasm of pancreas, unspecified: C25.9

## 2022-01-08 LAB — COMPREHENSIVE METABOLIC PANEL
ALT: 11 U/L (ref 0–44)
AST: 20 U/L (ref 15–41)
Albumin: 2.5 g/dL — ABNORMAL LOW (ref 3.5–5.0)
Alkaline Phosphatase: 132 U/L — ABNORMAL HIGH (ref 38–126)
Anion gap: 7 (ref 5–15)
BUN: 12 mg/dL (ref 8–23)
CO2: 19 mmol/L — ABNORMAL LOW (ref 22–32)
Calcium: 7.7 mg/dL — ABNORMAL LOW (ref 8.9–10.3)
Chloride: 108 mmol/L (ref 98–111)
Creatinine, Ser: 1.36 mg/dL — ABNORMAL HIGH (ref 0.61–1.24)
GFR, Estimated: 56 mL/min — ABNORMAL LOW (ref >60)
Glucose, Bld: 100 mg/dL — ABNORMAL HIGH (ref 70–99)
Potassium: 4.4 mmol/L (ref 3.5–5.1)
Sodium: 134 mmol/L — ABNORMAL LOW (ref 135–145)
Total Bilirubin: 0.3 mg/dL (ref 0.3–1.2)
Total Protein: 5.1 g/dL — ABNORMAL LOW (ref 6.5–8.1)

## 2022-01-08 LAB — CBC WITH DIFFERENTIAL/PLATELET
Abs Immature Granulocytes: 0.4 10*3/uL — ABNORMAL HIGH (ref 0.00–0.07)
Band Neutrophils: 9 %
Basophils Absolute: 0 10*3/uL (ref 0.0–0.1)
Basophils Relative: 0 %
Eosinophils Absolute: 0 10*3/uL (ref 0.0–0.5)
Eosinophils Relative: 0 %
HCT: 22.3 % — ABNORMAL LOW (ref 39.0–52.0)
Hemoglobin: 7.2 g/dL — ABNORMAL LOW (ref 13.0–17.0)
Lymphocytes Relative: 4 %
Lymphs Abs: 0.3 10*3/uL — ABNORMAL LOW (ref 0.7–4.0)
MCH: 32 pg (ref 26.0–34.0)
MCHC: 32.3 g/dL (ref 30.0–36.0)
MCV: 99.1 fL (ref 80.0–100.0)
Metamyelocytes Relative: 2 %
Monocytes Absolute: 0.7 10*3/uL (ref 0.1–1.0)
Monocytes Relative: 9 %
Myelocytes: 2 %
Neutro Abs: 6 10*3/uL (ref 1.7–7.7)
Neutrophils Relative %: 73 %
Platelets: 87 10*3/uL — ABNORMAL LOW (ref 150–400)
Promyelocytes Relative: 1 %
RBC: 2.25 MIL/uL — ABNORMAL LOW (ref 4.22–5.81)
RDW: 18.8 % — ABNORMAL HIGH (ref 11.5–15.5)
WBC: 7.3 10*3/uL (ref 4.0–10.5)
nRBC: 0.3 % — ABNORMAL HIGH (ref 0.0–0.2)

## 2022-01-08 LAB — URINALYSIS, ROUTINE W REFLEX MICROSCOPIC
Bilirubin Urine: NEGATIVE
Glucose, UA: NEGATIVE mg/dL
Hgb urine dipstick: NEGATIVE
Ketones, ur: NEGATIVE mg/dL
Leukocytes,Ua: NEGATIVE
Nitrite: NEGATIVE
Protein, ur: 30 mg/dL — AB
Specific Gravity, Urine: 1.02 (ref 1.005–1.030)
pH: 5 (ref 5.0–8.0)

## 2022-01-08 LAB — ABO/RH: ABO/RH(D): O POS

## 2022-01-08 LAB — MAGNESIUM: Magnesium: 1.7 mg/dL (ref 1.7–2.4)

## 2022-01-08 LAB — PREPARE RBC (CROSSMATCH)

## 2022-01-08 LAB — POC OCCULT BLOOD, ED: Fecal Occult Bld: POSITIVE — AB

## 2022-01-08 MED ORDER — PANCRELIPASE (LIP-PROT-AMYL) 12000-38000 UNITS PO CPEP: 72000.0000 [IU] | ORAL_CAPSULE | ORAL | Status: DC | PRN

## 2022-01-08 MED ORDER — PROCHLORPERAZINE MALEATE 5 MG PO TABS: 10.0000 mg | ORAL_TABLET | Freq: Four times a day (QID) | ORAL | Status: DC | PRN

## 2022-01-08 MED ORDER — PANTOPRAZOLE SODIUM 40 MG PO TBEC
40.0000 mg | DELAYED_RELEASE_TABLET | Freq: Two times a day (BID) | ORAL | Status: DC
  Administered 2022-01-09 (×2): 40 mg via ORAL
  Filled 2022-01-08 (×3): qty 1

## 2022-01-08 MED ORDER — PANCRELIPASE (LIP-PROT-AMYL) 36000-114000 UNITS PO CPEP: 72000.0000 [IU] | ORAL_CAPSULE | ORAL | Status: DC

## 2022-01-08 MED ORDER — CHLORPROMAZINE HCL 25 MG PO TABS: 25.0000 mg | ORAL_TABLET | Freq: Four times a day (QID) | ORAL | Status: DC | PRN

## 2022-01-08 MED ORDER — SODIUM CHLORIDE 0.9% IV SOLUTION: Freq: Once | INTRAVENOUS | Status: AC

## 2022-01-08 MED ORDER — HYDROCODONE-ACETAMINOPHEN 5-325 MG PO TABS
1.0000 | ORAL_TABLET | Freq: Three times a day (TID) | ORAL | Status: DC | PRN
  Administered 2022-01-09 – 2022-01-10 (×3): 1 via ORAL
  Filled 2022-01-08 (×3): qty 1

## 2022-01-08 MED ORDER — CARVEDILOL 12.5 MG PO TABS: 12.5000 mg | ORAL_TABLET | Freq: Two times a day (BID) | ORAL | Status: DC

## 2022-01-08 MED ORDER — DIPHENOXYLATE-ATROPINE 2.5-0.025 MG PO TABS
1.0000 | ORAL_TABLET | Freq: Four times a day (QID) | ORAL | Status: DC
  Administered 2022-01-09: 1 via ORAL
  Filled 2022-01-08: qty 1

## 2022-01-08 MED ORDER — MIRTAZAPINE 30 MG PO TABS
30.0000 mg | ORAL_TABLET | Freq: Every day | ORAL | Status: DC
  Administered 2022-01-09: 30 mg via ORAL
  Filled 2022-01-08: qty 1

## 2022-01-08 MED ORDER — GABAPENTIN 100 MG PO CAPS
100.0000 mg | ORAL_CAPSULE | Freq: Every day | ORAL | Status: DC
  Administered 2022-01-09: 100 mg via ORAL
  Filled 2022-01-08: qty 1

## 2022-01-08 MED ORDER — ATORVASTATIN CALCIUM 20 MG PO TABS
20.0000 mg | ORAL_TABLET | Freq: Every day | ORAL | Status: DC
  Administered 2022-01-09: 20 mg via ORAL
  Filled 2022-01-08: qty 1
  Filled 2022-01-08: qty 2

## 2022-01-08 MED ORDER — PANCRELIPASE (LIP-PROT-AMYL) 12000-38000 UNITS PO CPEP
108000.0000 [IU] | ORAL_CAPSULE | Freq: Three times a day (TID) | ORAL | Status: DC
  Administered 2022-01-09 – 2022-01-10 (×3): 108000 [IU] via ORAL
  Filled 2022-01-08 (×4): qty 9

## 2022-01-08 MED ORDER — ONDANSETRON HCL 4 MG/2ML IJ SOLN: 4.0000 mg | Freq: Four times a day (QID) | INTRAMUSCULAR | Status: DC | PRN

## 2022-01-08 MED ORDER — ONDANSETRON HCL 4 MG PO TABS: 4.0000 mg | ORAL_TABLET | Freq: Four times a day (QID) | ORAL | Status: DC | PRN

## 2022-01-08 NOTE — H&P (Signed)
History and Physical    Patient: [name] QBH:[id number] DOB: [birth year] DOA: 01/08/2022 DOS: the patient was seen and examined on 01/08/2022 PCP: [name], MD  Patient coming from: Home  Chief Complaint:  Chief Complaint  Patient presents with   Shortness of Breath   HPI: [name] is a 71 y.o. male with medical history significant of adenocarcinoma of the colon, pancreatic cancer, stage III chronic kidney disease, history of pulmonary embolism on anticoagulation, history of C. difficile, history of bowel obstruction.  Patient seen for increasing shortness of breath over the past 24 hours.  The patient began having shortness of breath this morning and has been unable to walk 25 to 30 feet without having to stop to breathe.  He has been monitored for anemia over the past several months.  In July, his hemoglobin was 12.3.  It has diminished to 8.3 on 9/27, then 7.2 today.  Denies bloody stools, abdominal pain, melena.  He does have diarrhea that is reminiscent of his previous experiences with C. difficile.  Review of Systems: As mentioned in the history of present illness. All other systems reviewed and are negative. 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   Stage 3   Anemia    Anxiety    Arthritis    Barrett's esophagus    Blood transfusion    Bowel obstruction ([place]) 05/13/2012   Recurrent   Bronchitis    Chest pain at rest    Chronic abdominal pain    Erosive esophagitis    ETOH abuse    quit 03/2010   GERD (gastroesophageal reflux disease)    Hx of Clostridium difficile infection 01/2012   Hypertension    Ileus ([place])    Iron deficiency anemia 03/23/2016   Lung cancer ([place])    Obstruction of bowel ([place]) 03/03/2014   Osteoporosis    Pancreatic cancer (HCC)    Personal history of PE (pulmonary embolism) 10/01/2010   Pneumonia    Pulmonary embolism ([place]) 02/2010   Recurrent upper respiratory  infection (URI)    Renal disorder    S/P endoscopy 09/28/2010   erosive reflux esophagitis, Billroth I anatomy   S/P partial gastrectomy 1980s   Seizures ([place])    was on meds for 6 months, Unknown etiology, last seizure was 2017.   Shortness of breath    TIA (transient ischemic attack) 12/2009   Vitamin B12 deficiency    Past Surgical History:  Procedure Laterality Date   ABDOMINAL ADHESION SURGERY  03/04/15   @ [facility]   ABDOMINAL EXPLORATION SURGERY     abdominal sugery     for bowel obstruction x 8, all in 1980s, except for one in 07/2010   APPENDECTOMY  1980s   Billroth 1 hemigastrectomy  1980s   per patient for benign duodenal tumor   CARDIAC CATHETERIZATION  07/17/2012   CHOLECYSTECTOMY  1980s   COLON SURGERY  May 2012   left hemicolectomy, colon cancer found at time of surgery for bowel obstruction   COLON SURGERY  10/12/2018   At [facility]: Resection of right colon and terminal ileum with creation of ileorectal anastomosis for large bowel obstruction.  Cecum and ascending colon noted to be attached to the rectum.   COLONOSCOPY  03/18/2011   anastomosis at 35cm. Several adenomatous polyps removed. Sigmoid diverticulosis. Next TCS 02/2013   COLONOSCOPY N/A 07/24/2012   [id number] post segmental resection with normal-appearing colonic anastomosis aside from an adjacent polyp-removed  as described above. Rectal polyp-removed as described above. CT findings appear to have been artifactual. tubular adenomas/prolapsed type polyp.   COLONOSCOPY N/A 05/15/2015   Procedure: COLONOSCOPY;  Surgeon: [name], MD;  [facility];  Service: Endoscopy;  Laterality: N/A;   COLONOSCOPY  09/26/2018   at [facility]: prep not adequate for colon cancer surveillance.  Prior end-to-end colonic anastomosis in the rectosigmoid region.  This was patent and characterized by healthy-appearing mucosa.  Anastomosis was traversed.  Normal terminal ileum.   COLONOSCOPY WITH PROPOFOL N/A 11/04/2016   Dr. [name],  status post subtotal colectomy with normal-appearing residual lower GI tract.  Next colonoscopy in 5 years.   ESOPHAGOGASTRODUODENOSCOPY  09/28/2010   ESOPHAGOGASTRODUODENOSCOPY  12/01/2010   Cervical web status post dilation, erosive esophagitis, B1 hemigastrectomy, inflamed anastomosis   ESOPHAGOGASTRODUODENOSCOPY  04/16/2011   excoriation at [facility] c/w trauma/M-W tear, friable gastric anastomosis, dilation efferent limb   ESOPHAGOGASTRODUODENOSCOPY N/A 06/03/2014   Dr.[name]- cervcal esopphageal web s/p dilation. abnormal distal esophagus bx= barretts esophagus   ESOPHAGOGASTRODUODENOSCOPY N/A 05/15/2015   Procedure: ESOPHAGOGASTRODUODENOSCOPY (EGD);  Surgeon: [name], MD;  [facility];  Service: Endoscopy;  Laterality: N/A;  230   ESOPHAGOGASTRODUODENOSCOPY (EGD) WITH ESOPHAGEAL DILATION  02/25/2012   PRF:[id number] esophageal web-s/p dilation anddisruption as described above. Status post prior gastric with Billroth I configuration. Abnormal gastric mucosa at the anastomosis. Gastric biopsy showed mild chronic inflammation but no H. pylori    ESOPHAGOGASTRODUODENOSCOPY (EGD) WITH PROPOFOL N/A 11/04/2016   Dr. [name]: Reflux esophagitis, small hiatal hernia status post hemigastrectomy.  Single patent efferent small bowel limb appeared normal, 2 x 2 centimeter tongue of salmon epithelium again seen, esophageal dilation.  Biopsies consistent with reflux changes, not Barrett's however this was confirmed on prior EGDs.  Offer 3-year follow-up EGD August 2021.   ESOPHAGOGASTRODUODENOSCOPY (EGD) WITH PROPOFOL N/A 03/03/2020   Procedure: ESOPHAGOGASTRODUODENOSCOPY (EGD) WITH PROPOFOL;  Surgeon: [name], MD;  [facility];  Service: Endoscopy;  Laterality: N/A;  3:00pm   HERNIA REPAIR     right inguinal   MALONEY DILATION N/A 06/03/2014   Procedure: [name] DILATION;  Surgeon: [name], MD;  [facility];  Service: Endoscopy;  Laterality: N/A;   MALONEY DILATION  N/A 05/15/2015   Procedure: [name] DILATION;  Surgeon: [name], MD;  [facility];  Service: Endoscopy;  Laterality: N/A;   MALONEY DILATION N/A 11/04/2016   Procedure: [name] DILATION;  Surgeon: [name], MD;  [facility];  Service: Endoscopy;  Laterality: N/A;   MALONEY DILATION N/A 03/03/2020   Procedure: [name] DILATION;  Surgeon: [name], MD;  [facility];  Service: Endoscopy;  Laterality: N/A;   PORTACATH PLACEMENT     SAVORY DILATION N/A 06/03/2014   Procedure: SAVORY DILATION;  Surgeon: [name], MD;  [facility];  Service: Endoscopy;  Laterality: N/A;   Social History:  reports that he quit smoking about 9 years ago. His smoking use included cigarettes. He has a 20.00 pack-year smoking history. He has never used smokeless tobacco. He reports that he does not drink alcohol and does not use drugs.  No Known Allergies  Family History  Problem Relation Age of Onset   Hypertension Mother    Arthritis Mother    Pneumonia Mother    Hypertension Father    Heart attack Father    Bone cancer Cousin    Colon cancer Neg Hx  Prior to Admission medications   Medication Sig Start Date End Date Taking? Authorizing Provider  ammonium lactate (AMLACTIN) 12 % cream Apply 1 g topically See admin instructions. APPLY CREAM TO DRY SKIN ON LEGS ARMS HANDS AND BACK AREA ONCE TO TWICE DAILY (AVOID FACE GROIN UNDERARMS) 11/22/17   [provider]  apixaban (ELIQUIS) 5 MG TABS tablet Take 1 tablet (5 mg total) by mouth 2 (two) times daily. 10/19/21   Derek Jack, MD  atorvastatin (LIPITOR) 20 MG tablet Take by mouth.    [provider]  Bacillus Coagulans-Inulin (PROBIOTIC) 1-250 BILLION-MG CAPS Take by mouth.    [provider]  baclofen (LIORESAL) 10 MG tablet as needed. 10/12/19   [provider]  carvedilol (COREG) 25 MG tablet Take 0.5 tablets (12.5 mg total) by mouth 2 (two) times  daily with a meal. Patient taking differently: Take 25 mg by mouth 2 (two) times daily with a meal. Takes 25 mg 2 times daily. 05/06/19   Roxan Hockey, MD  chlorproMAZINE (THORAZINE) 25 MG tablet Take 1 tablet (25 mg total) by mouth every 6 (six) hours as needed. 01/01/22   Derek Jack, MD  Cholecalciferol (VITAMIN D) 2000 units CAPS Take 2,000 Units by mouth daily.    [provider]  Cholecalciferol (VITAMIN D3) 10 MCG (400 UNIT) tablet Take by mouth.    [provider]  CREON 36000-114000 units CPEP capsule Take by mouth. 11/18/21   [provider]  cyanocobalamin 100 MCG tablet Take by mouth.    [provider]  diclofenac Sodium (VOLTAREN) 1 % GEL Apply topically. 05/09/21   [provider]  diphenoxylate-atropine (LOMOTIL) 2.5-0.025 MG tablet Take 1 tablet by mouth 4 times daily 12/24/21   Mahala Menghini, PA-C  ferrous sulfate 325 (65 FE) MG EC tablet Take by mouth.    [provider]  gabapentin (NEURONTIN) 100 MG capsule TAKE 1 CAPSULE BY MOUTH EVERY DAY AT BEDTIME 10/22/21   Derek Jack, MD  HYDROcodone-acetaminophen (NORCO/VICODIN) 5-325 MG tablet Take 1 tablet by mouth every 8 (eight) hours as needed for moderate pain. 12/24/21   Derek Jack, MD  loperamide (IMODIUM A-D) 2 MG tablet Take 2 mg by mouth in the morning and at bedtime.    [provider]  magnesium oxide (MAG-OX) 400 (240 Mg) MG tablet Take 1 tablet (400 mg total) by mouth 2 (two) times daily. 10/27/21   Derek Jack, MD  mirtazapine (REMERON) 30 MG tablet TAKE 1 TABLET BY MOUTH AT BEDTIME 11/16/21   Derek Jack, MD  Multiple Vitamin (MULTIVITAMIN WITH MINERALS) TABS tablet Take 1 tablet by mouth daily.    [provider]  ondansetron (ZOFRAN-ODT) 4 MG disintegrating tablet TAKE 1 TABLET BY MOUTH EVERY 4 HOURS AS NEEDED FOR NAUSEA AND VOMITING 08/22/20   Mahala Menghini, PA-C  pantoprazole (PROTONIX) 40 MG tablet Take  1 tablet (40 mg total) by mouth 2 (two) times daily before a meal. 06/15/21   Mahala Menghini, PA-C  PREVALITE 4 g packet Take 1 packet (4 g total) by mouth daily. DO NOT TAKE WITHIN 2 HOURS OF OTHER MEDICATIONS 08/04/21   Mahala Menghini, PA-C  prochlorperazine (COMPAZINE) 10 MG tablet Take by mouth.    [provider]  sucralfate (CARAFATE) 1 GM/10ML suspension TAKE 10 MLS (1 G TOTAL) BY MOUTH 4 (FOUR) TIMES DAILY AS NEEDED. FOR BREAKTHROUGH HEARTBURN 10/22/20   Mahala Menghini, PA-C  traMADol (ULTRAM) 50 MG tablet Take 1 tablet by mouth  every 6 (six) hours as needed. 09/07/21   [provider]  lidocaine-prilocaine (EMLA) cream Apply to port site 1 hour prior to port access 10/13/21 11/22/21  Derek Jack, MD    Physical Exam: Vitals:   01/08/22 1515 01/08/22 1530 01/08/22 1600 01/08/22 1639  BP:  112/71 112/78 (!) 140/83  Pulse:  (!) 113  78  Resp:  16 18 16   Temp:      TempSrc:      SpO2: 100% 100%  100%  Weight:      Height:       General: Elderly male. Awake and alert and oriented x3. No acute cardiopulmonary distress.  HEENT: Normocephalic atraumatic.  Right and left ears normal in appearance.  Pupils equal, round, reactive to light. Extraocular muscles are intact. Sclerae anicteric and noninjected.  Moist mucosal membranes. No mucosal lesions.  Neck: Neck supple without lymphadenopathy. No carotid bruits. No masses palpated.  Cardiovascular: Regular rate with normal S1-S2 sounds. No murmurs, rubs, gallops auscultated. No JVD.  Respiratory: Good respiratory effort with no wheezes, rales, rhonchi. Lungs clear to auscultation bilaterally.  No accessory muscle use. Abdomen: Soft, nontender, nondistended. Active bowel sounds. No masses or hepatosplenomegaly  Skin: No rashes, lesions, or ulcerations.  Dry, warm to touch. 2+ dorsalis pedis and radial pulses. Musculoskeletal: No calf or leg pain. All major joints not erythematous nontender.  No upper or lower joint  deformation.  Good ROM.  No contractures  Psychiatric: Intact judgment and insight. Pleasant and cooperative. Neurologic: No focal neurological deficits. Strength is 5/5 and symmetric in upper and lower extremities.  Cranial nerves II through XII are grossly intact.  Data Reviewed: Results for orders placed or performed during the hospital encounter of 01/08/22 (from the past 24 hour(s))  CBC with Differential     Status: Abnormal   Collection Time: 01/08/22  1:47 PM  Result Value Ref Range   WBC 7.3 4.0 - 10.5 K/uL   RBC 2.25 (L) 4.22 - 5.81 MIL/uL   Hemoglobin 7.2 (L) 13.0 - 17.0 g/dL   HCT 22.3 (L) 39.0 - 52.0 %   MCV 99.1 80.0 - 100.0 fL   MCH 32.0 26.0 - 34.0 pg   MCHC 32.3 30.0 - 36.0 g/dL   RDW 18.8 (H) 11.5 - 15.5 %   Platelets 87 (L) 150 - 400 K/uL   nRBC 0.3 (H) 0.0 - 0.2 %   Neutrophils Relative % 73 %   Neutro Abs 6.0 1.7 - 7.7 K/uL   Band Neutrophils 9 %   Lymphocytes Relative 4 %   Lymphs Abs 0.3 (L) 0.7 - 4.0 K/uL   Monocytes Relative 9 %   Monocytes Absolute 0.7 0.1 - 1.0 K/uL   Eosinophils Relative 0 %   Eosinophils Absolute 0.0 0.0 - 0.5 K/uL   Basophils Relative 0 %   Basophils Absolute 0.0 0.0 - 0.1 K/uL   WBC Morphology DOHLE BODIES    RBC Morphology MORPHOLOGY UNREMARKABLE    Smear Review PLATELET COUNT CONFIRMED BY SMEAR    Metamyelocytes Relative 2 %   Myelocytes 2 %   Promyelocytes Relative 1 %   Abs Immature Granulocytes 0.40 (H) 0.00 - 0.07 K/uL  Comprehensive metabolic panel     Status: Abnormal   Collection Time: 01/08/22  1:47 PM  Result Value Ref Range   Sodium 134 (L) 135 - 145 mmol/L   Potassium 4.4 3.5 - 5.1 mmol/L   Chloride 108 98 - 111 mmol/L   CO2 19 (L) 22 -  32 mmol/L   Glucose, Bld 100 (H) 70 - 99 mg/dL   BUN 12 8 - 23 mg/dL   Creatinine, Ser 1.36 (H) 0.61 - 1.24 mg/dL   Calcium 7.7 (L) 8.9 - 10.3 mg/dL   Total Protein 5.1 (L) 6.5 - 8.1 g/dL   Albumin 2.5 (L) 3.5 - 5.0 g/dL   AST 20 15 - 41 U/L   ALT 11 0 - 44 U/L   Alkaline  Phosphatase 132 (H) 38 - 126 U/L   Total Bilirubin 0.3 0.3 - 1.2 mg/dL   GFR, Estimated 56 (L) >60 mL/min   Anion gap 7 5 - 15  Type and screen [facility]     Status: None   Collection Time: 01/08/22  1:47 PM  Result Value Ref Range   ABO/RH(D) O POS    Antibody Screen NEG    Sample Expiration      01/11/2022,2359 Performed at [facility], [address], [place], [place] [address]   Magnesium     Status: None   Collection Time: 01/08/22  1:47 PM  Result Value Ref Range   Magnesium 1.7 1.7 - 2.4 mg/dL  Urinalysis, Routine w reflex microscopic Urine, Clean Catch     Status: Abnormal   Collection Time: 01/08/22  2:05 PM  Result Value Ref Range   Color, Urine AMBER (A) YELLOW   APPearance HAZY (A) CLEAR   Specific Gravity, Urine 1.020 1.005 - 1.030   pH 5.0 5.0 - 8.0   Glucose, UA NEGATIVE NEGATIVE mg/dL   Hgb urine dipstick NEGATIVE NEGATIVE   Bilirubin Urine NEGATIVE NEGATIVE   Ketones, ur NEGATIVE NEGATIVE mg/dL   Protein, ur 30 (A) NEGATIVE mg/dL   Nitrite NEGATIVE NEGATIVE   Leukocytes,Ua NEGATIVE NEGATIVE   RBC / HPF 0-5 0 - 5 RBC/hpf   WBC, UA 6-10 0 - 5 WBC/hpf   Bacteria, UA RARE (A) NONE SEEN   Mucus PRESENT    Hyaline Casts, UA PRESENT   POC occult blood, ED     Status: Abnormal   Collection Time: 01/08/22  3:29 PM  Result Value Ref Range   Fecal Occult Bld POSITIVE (A) NEGATIVE   DG Chest Port 1 View  Result Date: 01/08/2022 CLINICAL DATA:  Shortness of breath EXAM: PORTABLE CHEST 1 VIEW COMPARISON:  Previous studies including the CT done on 06/07/21 and chest radiograph done on 04/18/2020, PET-CT done on the no 10/01/2021 FINDINGS: Cardiac size is within normal limits. There is decreased volume in the left lung. Surgical staples are seen in left mid and left lower lung fields. Linear densities in left lower lung fields suggest scarring. There are no signs of pulmonary edema or focal pulmonary consolidation. There is no significant pleural effusion or  pneumothorax. Tip of left subclavian chest port is seen in the region of superior vena cava. IMPRESSION: Previous partial resection of left lung. There are no signs of pulmonary edema or focal pulmonary consolidation. Electronically Signed   By: Elmer Picker M.D.   On: 01/08/2022 14:06     Assessment and Plan: No notes have been filed under this hospital service. Service: Hospitalist  Active Problems:   Pancytopenia due to antineoplastic chemotherapy ([place])   Bleeding gastrointestinal   Personal history of PE (pulmonary embolism)   HTN (hypertension)   H/O Clostridium difficile infection   Hx of colon cancer, stage III   Chronic diastolic heart failure (HCC)   CKD (chronic kidney disease) stage 3, GFR 30-59 ml/min (HCC)   Pancreatic  cancer ([place])  Symptomatic anemia in the setting of GI bleed, and pancytopenia due to antineoplastic chemotherapy Observation We will give 2 units of blood.  Patient discussed risks and benefits Recheck CBC in the morning Has probable GI bleed Consult GI History of PE on anticoagulation Hold anticoagulation for the moment Chronic diastolic heart failure currently compensated Continue beta blocker.  Euvolemic Colon cancer, pancreatic cancer Currently undergoing chemotherapy.  Has plans for surgery in the next month or 2. Hypertension Continue antihypertensives Stage III chronic kidney disease Diarrhea with history of C. Difficile We will screen for C. difficile.    Advance Care Planning:   Code Status: Prior full code confirmed by patient  Consults: GI  Family Communication: Wife and daughter present during interview and exam  Severity of Illness: The appropriate patient status for this patient is OBSERVATION. Observation status is judged to be reasonable and necessary in order to provide the required intensity of service to ensure the patient's safety. The patient's presenting symptoms, physical exam findings, and initial radiographic and  laboratory data in the context of their medical condition is felt to place them at decreased risk for further clinical deterioration. Furthermore, it is anticipated that the patient will be medically stable for discharge from the hospital within 2 midnights of admission.   Author: Truett Mainland, DO 01/08/2022 4:50 PM  For on call review [email or web address].

## 2022-01-08 NOTE — ED Triage Notes (Signed)
Pt reports sob since 10am this morning.  Reports has pancreatic cancer.  Last chemo treatment was last Wednesday.  Says he goes home with a pump for  few days.  Unable to obtain oral temp in triage.  C/O pain in left side of abd.

## 2022-01-08 NOTE — ED Provider Notes (Signed)
[facility] EMERGENCY DEPARTMENT Provider Note   CSN: [id number] Arrival date & time: 01/08/22  1244     History  Chief Complaint  Patient presents with  . Shortness of Breath    [name] is a 71 y.o. male.  Patient complains of being short of breath.  Patient is being treated in the oncology center for pancreatic cancer.  Patient is followed here at the oncology center.  Patient has past medical history of PE hypertension congestive heart failure.  He has had GI bleeding in the past.  He has a history of chronic kidney disease Barrett's esophagus.  Reports he noticed increasing shortness of breath earlier today.  Patient reports he has had a poor appetite.  He has had decreased fluid intake.  The history is provided by the patient and the spouse. No language interpreter was used.  Shortness of Breath Severity:  Moderate Duration:  2 hours Timing:  Constant Context: not activity   Relieved by:  Nothing Worsened by:  Nothing Ineffective treatments:  None tried Associated symptoms: no abdominal pain   Risk factors: no recent alcohol use        Home Medications Prior to Admission medications   Medication Sig Start Date End Date Taking? Authorizing Provider  ammonium lactate (AMLACTIN) 12 % cream Apply 1 g topically See admin instructions. APPLY CREAM TO DRY SKIN ON LEGS ARMS HANDS AND BACK AREA ONCE TO TWICE DAILY (AVOID FACE GROIN UNDERARMS) 11/22/17   [provider]  apixaban (ELIQUIS) 5 MG TABS tablet Take 1 tablet (5 mg total) by mouth 2 (two) times daily. 10/19/21   Derek Jack, MD  atorvastatin (LIPITOR) 20 MG tablet Take by mouth.    [provider]  Bacillus Coagulans-Inulin (PROBIOTIC) 1-250 BILLION-MG CAPS Take by mouth.    [provider]  baclofen (LIORESAL) 10 MG tablet as needed. 10/12/19   [provider]  carvedilol (COREG) 25 MG tablet Take 0.5 tablets (12.5 mg total) by mouth 2 (two) times daily with a  meal. Patient taking differently: Take 25 mg by mouth 2 (two) times daily with a meal. Takes 25 mg 2 times daily. 05/06/19   Roxan Hockey, MD  chlorproMAZINE (THORAZINE) 25 MG tablet Take 1 tablet (25 mg total) by mouth every 6 (six) hours as needed. 01/01/22   Derek Jack, MD  Cholecalciferol (VITAMIN D) 2000 units CAPS Take 2,000 Units by mouth daily.    [provider]  Cholecalciferol (VITAMIN D3) 10 MCG (400 UNIT) tablet Take by mouth.    [provider]  CREON 36000-114000 units CPEP capsule Take by mouth. 11/18/21   [provider]  cyanocobalamin 100 MCG tablet Take by mouth.    [provider]  diclofenac Sodium (VOLTAREN) 1 % GEL Apply topically. 05/09/21   [provider]  diphenoxylate-atropine (LOMOTIL) 2.5-0.025 MG tablet Take 1 tablet by mouth 4 times daily 12/24/21   Mahala Menghini, PA-C  ferrous sulfate 325 (65 FE) MG EC tablet Take by mouth.    [provider]  gabapentin (NEURONTIN) 100 MG capsule TAKE 1 CAPSULE BY MOUTH EVERY DAY AT BEDTIME 10/22/21   Derek Jack, MD  HYDROcodone-acetaminophen (NORCO/VICODIN) 5-325 MG tablet Take 1 tablet by mouth every 8 (eight) hours as needed for moderate pain. 12/24/21   Derek Jack, MD  loperamide (IMODIUM A-D) 2 MG tablet Take 2 mg by mouth in the morning and at bedtime.    [provider]  magnesium oxide (MAG-OX) 400 (240 Mg)  MG tablet Take 1 tablet (400 mg total) by mouth 2 (two) times daily. 10/27/21   Derek Jack, MD  mirtazapine (REMERON) 30 MG tablet TAKE 1 TABLET BY MOUTH AT BEDTIME 11/16/21   Derek Jack, MD  Multiple Vitamin (MULTIVITAMIN WITH MINERALS) TABS tablet Take 1 tablet by mouth daily.    [provider]  ondansetron (ZOFRAN-ODT) 4 MG disintegrating tablet TAKE 1 TABLET BY MOUTH EVERY 4 HOURS AS NEEDED FOR NAUSEA AND VOMITING 08/22/20   Mahala Menghini, PA-C  pantoprazole (PROTONIX) 40 MG tablet Take 1 tablet (40  mg total) by mouth 2 (two) times daily before a meal. 06/15/21   Mahala Menghini, PA-C  PREVALITE 4 g packet Take 1 packet (4 g total) by mouth daily. DO NOT TAKE WITHIN 2 HOURS OF OTHER MEDICATIONS 08/04/21   Mahala Menghini, PA-C  prochlorperazine (COMPAZINE) 10 MG tablet Take by mouth.    [provider]  sucralfate (CARAFATE) 1 GM/10ML suspension TAKE 10 MLS (1 G TOTAL) BY MOUTH 4 (FOUR) TIMES DAILY AS NEEDED. FOR BREAKTHROUGH HEARTBURN 10/22/20   Mahala Menghini, PA-C  traMADol (ULTRAM) 50 MG tablet Take 1 tablet by mouth every 6 (six) hours as needed. 09/07/21   [provider]  lidocaine-prilocaine (EMLA) cream Apply to port site 1 hour prior to port access 10/13/21 11/22/21  Derek Jack, MD      Allergies    Patient has no known allergies.    Review of Systems   Review of Systems  Respiratory:  Positive for shortness of breath.   Gastrointestinal:  Negative for abdominal pain.  All other systems reviewed and are negative.   Physical Exam Updated Vital Signs BP 113/71   Pulse 73   Temp 98.3 F (36.8 C) (Axillary)   Resp 14   Ht 5\' 8"  (1.727 m)   Wt 52.2 kg   SpO2 100%   BMI 17.49 kg/m  Physical Exam Vitals and nursing note reviewed.  Constitutional:      General: He is not in acute distress.    Appearance: He is well-developed.  HENT:     Head: Normocephalic and atraumatic.  Eyes:     Conjunctiva/sclera: Conjunctivae normal.  Cardiovascular:     Rate and Rhythm: Normal rate and regular rhythm.     Heart sounds: No murmur heard. Pulmonary:     Effort: Pulmonary effort is normal. No respiratory distress.     Breath sounds: Normal breath sounds. No decreased breath sounds.  Chest:     Chest wall: No mass.  Abdominal:     Palpations: Abdomen is soft.     Tenderness: There is no abdominal tenderness.  Genitourinary:    Comments: Hemoccult positive no formed stool in vault Musculoskeletal:        General: No swelling.     Cervical back: Neck  supple.  Skin:    General: Skin is warm and dry.     Capillary Refill: Capillary refill takes less than 2 seconds.  Neurological:     Mental Status: He is alert.  Psychiatric:        Mood and Affect: Mood normal.    ED Results / Procedures / Treatments   Labs (all labs ordered are listed, but only abnormal results are displayed) Labs Reviewed  CBC WITH DIFFERENTIAL/PLATELET - Abnormal; Notable for the following components:      Result Value   RBC 2.25 (*)    Hemoglobin 7.2 (*)    HCT 22.3 (*)  RDW 18.8 (*)    Platelets 87 (*)    nRBC 0.3 (*)    Lymphs Abs 0.3 (*)    Abs Immature Granulocytes 0.40 (*)    All other components within normal limits  COMPREHENSIVE METABOLIC PANEL - Abnormal; Notable for the following components:   Sodium 134 (*)    CO2 19 (*)    Glucose, Bld 100 (*)    Creatinine, Ser 1.36 (*)    Calcium 7.7 (*)    Total Protein 5.1 (*)    Albumin 2.5 (*)    Alkaline Phosphatase 132 (*)    GFR, Estimated 56 (*)    All other components within normal limits  URINALYSIS, ROUTINE W REFLEX MICROSCOPIC - Abnormal; Notable for the following components:   Color, Urine AMBER (*)    APPearance HAZY (*)    Protein, ur 30 (*)    Bacteria, UA RARE (*)    All other components within normal limits  POC OCCULT BLOOD, ED - Abnormal; Notable for the following components:   Fecal Occult Bld POSITIVE (*)    All other components within normal limits  MAGNESIUM  OCCULT BLOOD X 1 CARD TO LAB, STOOL  TYPE AND SCREEN    EKG EKG Interpretation  Date/Time:  Friday January 08 2022 13:07:24 EDT Ventricular Rate:  87 PR Interval:  120 QRS Duration: 76 QT Interval:  340 QTC Calculation: 409 R Axis:   97 Text Interpretation: Normal sinus rhythm Septal infarct , age undetermined Lateral infarct , age undetermined Abnormal ECG When compared with ECG of 18-Apr-2020 12:12, Septal infarct is now Present Lateral infarct is now Present Confirmed by Aletta Edouard [phone]) on  01/08/2022 1:09:51 PM  Radiology DG Chest Port 1 View  Result Date: 01/08/2022 CLINICAL DATA:  Shortness of breath EXAM: PORTABLE CHEST 1 VIEW COMPARISON:  Previous studies including the CT done on May 29, 2021 and chest radiograph done on 04/18/2020, PET-CT done on the no 10/01/2021 FINDINGS: Cardiac size is within normal limits. There is decreased volume in the left lung. Surgical staples are seen in left mid and left lower lung fields. Linear densities in left lower lung fields suggest scarring. There are no signs of pulmonary edema or focal pulmonary consolidation. There is no significant pleural effusion or pneumothorax. Tip of left subclavian chest port is seen in the region of superior vena cava. IMPRESSION: Previous partial resection of left lung. There are no signs of pulmonary edema or focal pulmonary consolidation. Electronically Signed   By: Elmer Picker M.D.   On: 01/08/2022 14:06    Procedures Procedures    Medications Ordered in ED Medications - No data to display  ED Course/ Medical Decision Making/ A&P Clinical Course as of 01/08/22 1532  Fri Jan 08, 6950  [id number] 71 year old male here for evaluation of increased shortness of breath in the setting of cancer.  He has a history of PE.  Satting 100% on room air.  Generally weak.  Getting labs imaging EKG.  Disposition per results of testing. [MB]    Clinical Course User Index [MB] Hayden Rasmussen, MD                           Medical Decision Making Patient complains of weakness and feeling short of breath  Amount and/or Complexity of Data Reviewed Independent Historian: spouse    Details: Is here with family who are supportive External Data Reviewed: notes.    Details: College he notes reviewed  Labs: ordered. Decision-making details documented in ED Course.    Details: Labs ordered reviewed and interpreted patient's hemoglobin is 7.2.  This is a decrease from patient's baseline Radiology: ordered. ECG/medicine  tests: ordered and independent interpretation performed.    Details: EKG shows normal sinus rhythm Discussion of management or test interpretation with external provider(s): Hospitalist consulted for admission  Risk Decision regarding hospitalization.            Final Clinical Impression(s) / ED Diagnoses Final diagnoses:  Iron deficiency anemia due to chronic blood loss    Rx / DC Orders ED Discharge Orders     None         Sidney Ace 01/08/22 2245    Hayden Rasmussen, MD 01/09/22 1038

## 2022-01-09 DIAGNOSIS — T451X5A Adverse effect of antineoplastic and immunosuppressive drugs, initial encounter: Secondary | ICD-10-CM | POA: Diagnosis present

## 2022-01-09 DIAGNOSIS — D649 Anemia, unspecified: Secondary | ICD-10-CM | POA: Diagnosis not present

## 2022-01-09 DIAGNOSIS — M810 Age-related osteoporosis without current pathological fracture: Secondary | ICD-10-CM | POA: Diagnosis present

## 2022-01-09 DIAGNOSIS — Z87891 Personal history of nicotine dependence: Secondary | ICD-10-CM | POA: Diagnosis not present

## 2022-01-09 DIAGNOSIS — N1831 Chronic kidney disease, stage 3a: Secondary | ICD-10-CM

## 2022-01-09 DIAGNOSIS — Z8673 Personal history of transient ischemic attack (TIA), and cerebral infarction without residual deficits: Secondary | ICD-10-CM | POA: Diagnosis not present

## 2022-01-09 DIAGNOSIS — Z85118 Personal history of other malignant neoplasm of bronchus and lung: Secondary | ICD-10-CM | POA: Diagnosis not present

## 2022-01-09 DIAGNOSIS — N183 Chronic kidney disease, stage 3 unspecified: Secondary | ICD-10-CM | POA: Diagnosis present

## 2022-01-09 DIAGNOSIS — D500 Iron deficiency anemia secondary to blood loss (chronic): Secondary | ICD-10-CM

## 2022-01-09 DIAGNOSIS — Z7901 Long term (current) use of anticoagulants: Secondary | ICD-10-CM

## 2022-01-09 DIAGNOSIS — R109 Unspecified abdominal pain: Secondary | ICD-10-CM | POA: Diagnosis not present

## 2022-01-09 DIAGNOSIS — G8929 Other chronic pain: Secondary | ICD-10-CM | POA: Diagnosis present

## 2022-01-09 DIAGNOSIS — K2100 Gastro-esophageal reflux disease with esophagitis, without bleeding: Secondary | ICD-10-CM

## 2022-01-09 DIAGNOSIS — F419 Anxiety disorder, unspecified: Secondary | ICD-10-CM | POA: Diagnosis present

## 2022-01-09 DIAGNOSIS — E538 Deficiency of other specified B group vitamins: Secondary | ICD-10-CM

## 2022-01-09 DIAGNOSIS — K529 Noninfective gastroenteritis and colitis, unspecified: Secondary | ICD-10-CM

## 2022-01-09 DIAGNOSIS — Z85038 Personal history of other malignant neoplasm of large intestine: Secondary | ICD-10-CM | POA: Diagnosis not present

## 2022-01-09 DIAGNOSIS — R197 Diarrhea, unspecified: Secondary | ICD-10-CM | POA: Diagnosis not present

## 2022-01-09 DIAGNOSIS — Z86711 Personal history of pulmonary embolism: Secondary | ICD-10-CM | POA: Diagnosis not present

## 2022-01-09 DIAGNOSIS — I10 Essential (primary) hypertension: Secondary | ICD-10-CM

## 2022-01-09 DIAGNOSIS — Z8719 Personal history of other diseases of the digestive system: Secondary | ICD-10-CM | POA: Diagnosis not present

## 2022-01-09 DIAGNOSIS — D61810 Antineoplastic chemotherapy induced pancytopenia: Secondary | ICD-10-CM | POA: Diagnosis present

## 2022-01-09 DIAGNOSIS — C259 Malignant neoplasm of pancreas, unspecified: Secondary | ICD-10-CM | POA: Diagnosis present

## 2022-01-09 DIAGNOSIS — Z79899 Other long term (current) drug therapy: Secondary | ICD-10-CM | POA: Diagnosis not present

## 2022-01-09 DIAGNOSIS — Z903 Acquired absence of stomach [part of]: Secondary | ICD-10-CM | POA: Diagnosis not present

## 2022-01-09 DIAGNOSIS — R195 Other fecal abnormalities: Secondary | ICD-10-CM

## 2022-01-09 DIAGNOSIS — I130 Hypertensive heart and chronic kidney disease with heart failure and stage 1 through stage 4 chronic kidney disease, or unspecified chronic kidney disease: Secondary | ICD-10-CM | POA: Diagnosis present

## 2022-01-09 DIAGNOSIS — Z8249 Family history of ischemic heart disease and other diseases of the circulatory system: Secondary | ICD-10-CM | POA: Diagnosis not present

## 2022-01-09 DIAGNOSIS — C252 Malignant neoplasm of tail of pancreas: Secondary | ICD-10-CM

## 2022-01-09 DIAGNOSIS — Z86010 Personal history of colonic polyps: Secondary | ICD-10-CM | POA: Diagnosis not present

## 2022-01-09 DIAGNOSIS — Z9049 Acquired absence of other specified parts of digestive tract: Secondary | ICD-10-CM | POA: Diagnosis not present

## 2022-01-09 DIAGNOSIS — I5032 Chronic diastolic (congestive) heart failure: Secondary | ICD-10-CM | POA: Diagnosis present

## 2022-01-09 LAB — CBC
HCT: 27.2 % — ABNORMAL LOW (ref 39.0–52.0)
Hemoglobin: 8.9 g/dL — ABNORMAL LOW (ref 13.0–17.0)
MCH: 30.6 pg (ref 26.0–34.0)
MCHC: 32.7 g/dL (ref 30.0–36.0)
MCV: 93.5 fL (ref 80.0–100.0)
Platelets: 72 10*3/uL — ABNORMAL LOW (ref 150–400)
RBC: 2.91 MIL/uL — ABNORMAL LOW (ref 4.22–5.81)
RDW: 18.5 % — ABNORMAL HIGH (ref 11.5–15.5)
WBC: 5.6 10*3/uL (ref 4.0–10.5)
nRBC: 1.1 % — ABNORMAL HIGH (ref 0.0–0.2)

## 2022-01-09 LAB — FOLATE: Folate: >40 ng/mL (ref >5.9)

## 2022-01-09 LAB — C DIFFICILE QUICK SCREEN W PCR REFLEX
C Diff antigen: POSITIVE — AB
C Diff toxin: NEGATIVE

## 2022-01-09 LAB — CLOSTRIDIUM DIFFICILE BY PCR, REFLEXED: Toxigenic C. Difficile by PCR: NEGATIVE

## 2022-01-09 LAB — VITAMIN B12: Vitamin B-12: 5999 pg/mL — ABNORMAL HIGH (ref 180–914)

## 2022-01-09 LAB — FERRITIN: Ferritin: 979 ng/mL — ABNORMAL HIGH (ref 24–336)

## 2022-01-09 LAB — IRON AND TIBC
Iron: 169 ug/dL (ref 45–182)
Saturation Ratios: 74 % — ABNORMAL HIGH (ref 17.9–39.5)
TIBC: 227 ug/dL — ABNORMAL LOW (ref 250–450)
UIBC: 58 ug/dL

## 2022-01-09 MED ORDER — ACETAMINOPHEN 325 MG PO TABS: 650.0000 mg | ORAL_TABLET | Freq: Four times a day (QID) | ORAL | Status: DC | PRN

## 2022-01-09 MED ORDER — MAGNESIUM SULFATE 2 GM/50ML IV SOLN
2.0000 g | Freq: Once | INTRAVENOUS | Status: AC
  Administered 2022-01-09: 2 g via INTRAVENOUS
  Filled 2022-01-09: qty 50

## 2022-01-09 NOTE — Assessment & Plan Note (Signed)
Status post colon resection May 2012

## 2022-01-09 NOTE — Assessment & Plan Note (Signed)
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

## 2022-01-09 NOTE — Assessment & Plan Note (Signed)
Continue supplementation  ?

## 2022-01-09 NOTE — Assessment & Plan Note (Signed)
Follows with Dr. Delton Coombes Last FOLFIRINOX 01/01/22

## 2022-01-09 NOTE — Progress Notes (Signed)
PROGRESS NOTE  [name] QQV:[id number] DOB: 25-Nov-1950 DOA: 01/08/2022 PCP: [name], MD  Brief History:  71 year old male with a history of pancreatic adenocarcinoma, colon adenocarcinoma status post resection May 2012, adenocarcinoma of the left lung status post resection, iron deficiency anemia, pulmonary embolus on apixaban, hypertension, CKD stage III, GERD, remote tobacco abuse presenting with 1 week history of generalized weakness and shortness of breath.  The patient stated that has significantly worsened on the day prior to admission.  He denies any coughing or hemoptysis.  The patient denies any fevers, chills, chest pain, nausea, vomiting, abdominal pain, dysuria, hematuria.  He denies any hematochezia but states that he has occasional melena.  He takes iron supplementation.[name]  He states that his overall appetite is poor.  He denies any recent changes in any of his medications. Notably, the patient last received chemotherapy for his pancreatic adenocarcinoma on 01/01/2022. In the ED, the patient was afebrile with soft blood pressures.  His SBP were in the 80s.  WBC 7.3, hemoglobin 7.2, platelets 87,000.  Sodium 134, potassium 4.4, bicarbonate 19, serum creatinine 1.36.  EKG shows sinus rhythm with nonspecific T wave changes.  Chest x-ray showed no consolidation and no pulmonary edema.  The patient was transfused 2 units PRBC.  FOBT was positive.  GI was consulted to assist with management.    Assessment and Plan: * Symptomatic anemia 2 units PRBC ordered GI consulted Hemoglobin has gradually drifted down from 12.4 (10/23/21) to 7.2 (01/08/22) In part due to myelosuppression from chemotherapy Holding apixaban temporarily  Iron deficiency anemia Continue Fe supplement  Pancytopenia due to antineoplastic chemotherapy ([place]) Last FOLFIRINOX 01/01/22 Serial CBC  Chronic abdominal pain Patient has chronic left-sided abdominal pain Previously attributable to the  patient's malignancy Unchanged in the past month 01/06/2022 MRI abdomen--area of restricted diffusion in the distal pancreas 2.0 x 1.9 cm.  slightly smaller mass in the splenic hilum  Heme positive stool GI has been consulted Patient normally takes iron supplementation Continue pantoprazole  Pancreatic cancer ([place]) Follows with Dr. Delton Coombes Last FOLFIRINOX 01/01/22  B12 deficiency Continue supplementation  CKD (chronic kidney disease) stage 3, GFR 30-59 ml/min (HCC) Baseline creatinine 1.3-1.6 Serial BMP  Hx of colon cancer, stage III Status post colon resection May 2012  Diarrhea Check cdiff Stool pathogen panel  HTN (hypertension) No longer take carvedilol Patient states the blood pressures have remained soft at home with SBP in the 90s  Personal history of PE (pulmonary embolism) Taking apixaban prior to admission Apixaban on hold temporarily due to symptomatic anemia       Family Communication:   no Family at bedside  Consultants:  GI  Code Status:  FULL   DVT Prophylaxis: apixaban on hold   Procedures: As Listed in Progress Note Above  Antibiotics: None      Subjective: Patient denies fevers, chills, headache, chest pain, dyspnea, nausea, vomiting, diarrhea, abdominal pain, dysuria, hematuria, hematochezia, and melena.   Objective: Vitals:   01/09/22 0315 01/09/22 0330 01/09/22 0345 01/09/22 0400  BP: 98/61 104/65 (!) 85/60 (!) 89/58  Pulse: 72 77 76 73  Resp: 17 (!) 32 15 12  Temp:    98 F (36.7 C)  TempSrc:    Oral  SpO2: 96% 96% 96% 95%  Weight:      Height:        Intake/Output Summary (Last 24 hours) at 01/09/2022 0825 Last data filed at 01/09/2022 0356 Gross per 24  hour  Intake 653 ml  Output --  Net 653 ml   Weight change:  Exam:  General:  Pt is alert, follows commands appropriately, not in acute distress HEENT: No icterus, No thrush, No neck mass, [place]/AT Cardiovascular: RRR, S1/S2, no rubs, no  gallops Respiratory: CTA bilaterally, no wheezing, no crackles, no rhonchi Abdomen: Soft/+BS, non tender, non distended, no guarding Extremities: No edema, No lymphangitis, No petechiae, No rashes, no synovitis   Data Reviewed: I have personally reviewed following labs and imaging studies Basic Metabolic Panel: Recent Labs  Lab 01/08/22 1347  NA 134*  K 4.4  CL 108  CO2 19*  GLUCOSE 100*  BUN 12  CREATININE 1.36*  CALCIUM 7.7*  MG 1.7   Liver Function Tests: Recent Labs  Lab 01/08/22 1347  AST 20  ALT 11  ALKPHOS 132*  BILITOT 0.3  PROT 5.1*  ALBUMIN 2.5*   No results for input(s): "LIPASE", "AMYLASE" in the last 168 hours. No results for input(s): "AMMONIA" in the last 168 hours. Coagulation Profile: No results for input(s): "INR", "PROTIME" in the last 168 hours. CBC: Recent Labs  Lab 01/08/22 1347 01/09/22 0602  WBC 7.3 5.6  NEUTROABS 6.0  --   HGB 7.2* 8.9*  HCT 22.3* 27.2*  MCV 99.1 93.5  PLT 87* 72*   Cardiac Enzymes: No results for input(s): "CKTOTAL", "CKMB", "CKMBINDEX", "TROPONINI" in the last 168 hours. BNP: Invalid input(s): "POCBNP" CBG: No results for input(s): "GLUCAP" in the last 168 hours. HbA1C: No results for input(s): "HGBA1C" in the last 72 hours. Urine analysis:    Component Value Date/Time   COLORURINE AMBER (A) 01/08/2022 1405   APPEARANCEUR HAZY (A) 01/08/2022 1405   LABSPEC 1.020 01/08/2022 1405   PHURINE 5.0 01/08/2022 1405   GLUCOSEU NEGATIVE 01/08/2022 1405   HGBUR NEGATIVE 01/08/2022 1405   BILIRUBINUR NEGATIVE 01/08/2022 1405   KETONESUR NEGATIVE 01/08/2022 1405   PROTEINUR 30 (A) 01/08/2022 1405   UROBILINOGEN 0.2 11/30/2014 1115   NITRITE NEGATIVE 01/08/2022 1405   LEUKOCYTESUR NEGATIVE 01/08/2022 1405   Sepsis Labs: @LABRCNTIP (procalcitonin:4,lacticidven:4) )No results found for this or any previous visit (from the past 240 hour(s)).   Scheduled Meds:  atorvastatin  20 mg Oral Daily   gabapentin  100 mg  Oral QHS   lipase/protease/amylase  108,000 Units Oral TID WC   mirtazapine  30 mg Oral QHS   pantoprazole  40 mg Oral BID AC   Continuous Infusions:  Procedures/Studies: DG Chest Port 1 View  Result Date: 01/08/2022 CLINICAL DATA:  Shortness of breath EXAM: PORTABLE CHEST 1 VIEW COMPARISON:  Previous studies including the CT done on 2021/05/18 and chest radiograph done on 04/18/2020, PET-CT done on the no 10/01/2021 FINDINGS: Cardiac size is within normal limits. There is decreased volume in the left lung. Surgical staples are seen in left mid and left lower lung fields. Linear densities in left lower lung fields suggest scarring. There are no signs of pulmonary edema or focal pulmonary consolidation. There is no significant pleural effusion or pneumothorax. Tip of left subclavian chest port is seen in the region of superior vena cava. IMPRESSION: Previous partial resection of left lung. There are no signs of pulmonary edema or focal pulmonary consolidation. Electronically Signed   By: Elmer Picker M.D.   On: 01/08/2022 14:06   MR Abdomen W Wo Contrast  Result Date: 01/06/2022 CLINICAL DATA:  Assess for progression of pancreatic neoplasm in a 71 year old male. EXAM: MRI ABDOMEN WITHOUT AND WITH CONTRAST TECHNIQUE: Multiplanar multisequence  MR imaging of the abdomen was performed both before and after the administration of intravenous contrast. CONTRAST:  66mL GADAVIST GADOBUTROL 1 MMOL/ML IV SOLN COMPARISON:  October 01, 2021 PET evaluation and previous abdominal and pelvic CT of June of 2021. FINDINGS: Lower chest: Incidental imaging of the lung bases is unremarkable to the extent evaluated on this abdominal MRI. Hepatobiliary: Marked hepatic iron deposition with low signal throughout the liver. Signs of biliary dilation which has been present since June of 2021. Biliary enteric anatomy is of uncertain tight in this patient with history of [place] 1. Dilated bowel loops in the upper abdomen  leading 2 susceptibility artifact which limits assessment in addition to motion further limiting assessment of the biliary tree which is quite similar grossly in terms of degree of distension compared to more remote priors. There are no suspicious hepatic lesions within the limitations of the current study, limited by iron deposition in the liver and susceptibility artifact of adjacent distended bowel loops which are chronically distended. Pancreas: Pancreas is difficult to evaluate given presence of marked susceptibility artifact in the area of the upper abdomen due to distended bowel loops. Area of restricted diffusion in the distal pancreas measuring 2.0 x 1.9 cm previously approximately 1.8 x 1.9 cm in this area. Area with larger masslike appearance of the proximal body and tail junction has resolved. Spleen: Iron deposition in the spleen with signs of splenomegaly. Splenic vein may be occluded. Short gastrics are prominent. Adrenals/Urinary Tract: Marked iron deposition in the adrenal glands. Renal cortical scarring bilaterally with simple cyst arising from anterior RIGHT kidney for which no additional follow-up imaging is recommended. This measures 5.8 x 4.2 cm. No hydronephrosis or perinephric stranding. Stomach/Bowel: Chronically distended bowel loops throughout the abdomen. Signs of prior gastroenteric anastomosis and partial gastrectomy reportedly related to [place] 1. Biliary-enteric anatomy is of uncertain type and is stated above the biliary tree remains chronically dilated. Vascular/Lymphatic: Narrowing and or occlusion of the splenic vein due to mass in the splenic hilum. Other:  No ascites. Musculoskeletal: Iron deposition in marrow spaces throughout the visualized spine and pelvis. IMPRESSION: 1. Area of restricted diffusion in the distal pancreas measuring 2.0 x 1.9 cm previously approximately 1.8 x 1.9 cm in this area, also with enhancing tissue remaining in this area. Resolution of the more  masslike area in the lesser sac and distal pancreas seen on previous imaging. Is unclear whether this additional area represented changes related to pancreatitis or neoplasm. There is still a residual mass at the splenic hilum on today's study that may be slightly smaller. Overall there is improvement in the appearance of the pancreas. Suggest close attention on subsequent imaging. 2. Biliary enteric anatomy is of uncertain type and is stated above the biliary tree remains chronically dilated compared to more remote priors. Correlate with any clinical or laboratory evidence of hepatic dysfunction or biliary obstruction as the biliary tree is not well evaluated but grossly similar to prior imaging. 3. Hepatic, splenic, marrow space and adrenal iron deposition could be related to severe iron overload in the setting of exogenous iron administration but this pattern is uncommon with exogenous iron administration given the severity of adrenal iron deposition and should be correlated with any history of primary hemochromatosis. 4. Splenic vein may be occluded. 5. Overall the study is compromised by bowel distension, artifact and respiratory motion as outlined above. Electronically Signed   By: Zetta Bills M.D.   On: 01/06/2022 19:12   MR 3D Recon At Scanner  Result Date: 01/06/2022 CLINICAL DATA:  Assess for progression of pancreatic neoplasm in a 71 year old male. EXAM: MRI ABDOMEN WITHOUT AND WITH CONTRAST TECHNIQUE: Multiplanar multisequence MR imaging of the abdomen was performed both before and after the administration of intravenous contrast. CONTRAST:  56mL GADAVIST GADOBUTROL 1 MMOL/ML IV SOLN COMPARISON:  October 01, 2021 PET evaluation and previous abdominal and pelvic CT of June of 2021. FINDINGS: Lower chest: Incidental imaging of the lung bases is unremarkable to the extent evaluated on this abdominal MRI. Hepatobiliary: Marked hepatic iron deposition with low signal throughout the liver. Signs of biliary  dilation which has been present since June of 2021. Biliary enteric anatomy is of uncertain tight in this patient with history of [place] 1. Dilated bowel loops in the upper abdomen leading 2 susceptibility artifact which limits assessment in addition to motion further limiting assessment of the biliary tree which is quite similar grossly in terms of degree of distension compared to more remote priors. There are no suspicious hepatic lesions within the limitations of the current study, limited by iron deposition in the liver and susceptibility artifact of adjacent distended bowel loops which are chronically distended. Pancreas: Pancreas is difficult to evaluate given presence of marked susceptibility artifact in the area of the upper abdomen due to distended bowel loops. Area of restricted diffusion in the distal pancreas measuring 2.0 x 1.9 cm previously approximately 1.8 x 1.9 cm in this area. Area with larger masslike appearance of the proximal body and tail junction has resolved. Spleen: Iron deposition in the spleen with signs of splenomegaly. Splenic vein may be occluded. Short gastrics are prominent. Adrenals/Urinary Tract: Marked iron deposition in the adrenal glands. Renal cortical scarring bilaterally with simple cyst arising from anterior RIGHT kidney for which no additional follow-up imaging is recommended. This measures 5.8 x 4.2 cm. No hydronephrosis or perinephric stranding. Stomach/Bowel: Chronically distended bowel loops throughout the abdomen. Signs of prior gastroenteric anastomosis and partial gastrectomy reportedly related to [place] 1. Biliary-enteric anatomy is of uncertain type and is stated above the biliary tree remains chronically dilated. Vascular/Lymphatic: Narrowing and or occlusion of the splenic vein due to mass in the splenic hilum. Other:  No ascites. Musculoskeletal: Iron deposition in marrow spaces throughout the visualized spine and pelvis. IMPRESSION: 1. Area of restricted  diffusion in the distal pancreas measuring 2.0 x 1.9 cm previously approximately 1.8 x 1.9 cm in this area, also with enhancing tissue remaining in this area. Resolution of the more masslike area in the lesser sac and distal pancreas seen on previous imaging. Is unclear whether this additional area represented changes related to pancreatitis or neoplasm. There is still a residual mass at the splenic hilum on today's study that may be slightly smaller. Overall there is improvement in the appearance of the pancreas. Suggest close attention on subsequent imaging. 2. Biliary enteric anatomy is of uncertain type and is stated above the biliary tree remains chronically dilated compared to more remote priors. Correlate with any clinical or laboratory evidence of hepatic dysfunction or biliary obstruction as the biliary tree is not well evaluated but grossly similar to prior imaging. 3. Hepatic, splenic, marrow space and adrenal iron deposition could be related to severe iron overload in the setting of exogenous iron administration but this pattern is uncommon with exogenous iron administration given the severity of adrenal iron deposition and should be correlated with any history of primary hemochromatosis. 4. Splenic vein may be occluded. 5. Overall the study is compromised by bowel distension, artifact and  respiratory motion as outlined above. Electronically Signed   By: Zetta Bills M.D.   On: 01/06/2022 19:12    Orson Eva, DO  [facility]  If 7PM-7AM, please contact night-coverage [email or web address] Password [email or web address] 01/09/2022, 8:25 AM   LOS: 0 days

## 2022-01-09 NOTE — Progress Notes (Signed)
  Transition of Care [facility]) Screening Note   Patient Details  Name: DELVECCHIO MADOLE Date of Birth: Dec 22, 1950   Transition of Care [facility]) CM/SW Contact:    Kimber Relic, LCSW Phone Number: 01/09/2022, 2:23 PM    Transition of Care Department [facility]) has reviewed patient and no TOC needs have been identified at this time. We will continue to monitor patient advancement through interdisciplinary progression rounds. If new patient transition needs arise, please place a TOC consult.

## 2022-01-09 NOTE — Assessment & Plan Note (Signed)
No longer take carvedilol Patient states the blood pressures have remained soft at home with SBP in the 90s Personally rechecked myself 01/10/22 at 1145---HR 76 BP 100/69

## 2022-01-09 NOTE — Assessment & Plan Note (Addendum)
GI has been consulted Patient normally takes iron supplementation Continue pantoprazole

## 2022-01-09 NOTE — Assessment & Plan Note (Signed)
Last FOLFIRINOX 01/01/22 Serial CBC

## 2022-01-09 NOTE — Assessment & Plan Note (Signed)
Taking apixaban prior to admission Apixaban on hold temporarily due to symptomatic anemia

## 2022-01-09 NOTE — Hospital Course (Signed)
71 year old male with a history of pancreatic adenocarcinoma, colon adenocarcinoma status post resection May 2012, adenocarcinoma of the left lung status post resection, iron deficiency anemia, pulmonary embolus on apixaban, hypertension, CKD stage III, GERD, remote tobacco abuse presenting with 1 week history of generalized weakness and shortness of breath.  The patient stated that has significantly worsened on the day prior to admission.  He denies any coughing or hemoptysis.  The patient denies any fevers, chills, chest pain, nausea, vomiting, abdominal pain, dysuria, hematuria.  He denies any hematochezia but states that he has occasional melena.  He takes iron supplementation.[name]  He states that his overall appetite is poor.  He denies any recent changes in any of his medications. Notably, the patient last received chemotherapy for his pancreatic adenocarcinoma on 01/01/2022. In the ED, the patient was afebrile with soft blood pressures.  His SBP were in the 80s.  WBC 7.3, hemoglobin 7.2, platelets 87,000.  Sodium 134, potassium 4.4, bicarbonate 19, serum creatinine 1.36.  EKG shows sinus rhythm with nonspecific T wave changes.  Chest x-ray showed no consolidation and no pulmonary edema.  The patient was transfused 2 units PRBC.  FOBT was positive.  GI was consulted to assist with management.

## 2022-01-09 NOTE — Assessment & Plan Note (Signed)
Patient has chronic left-sided abdominal pain Previously attributable to the patient's malignancy Unchanged in the past month 01/06/2022 MRI abdomen--area of restricted diffusion in the distal pancreas 2.0 x 1.9 cm.  slightly smaller mass in the splenic hilum

## 2022-01-09 NOTE — Assessment & Plan Note (Signed)
Continue Fe supplement Iron sat 74%, ferritin 979

## 2022-01-09 NOTE — Assessment & Plan Note (Signed)
Check cdiff--antigen positive, toxin neg>>with no fever, normal WBC reflect colonixation Stool pathogen panel--pending at time of d/c Loperamide prn

## 2022-01-09 NOTE — Assessment & Plan Note (Signed)
Baseline creatinine 1.3-1.6 Serial BMP

## 2022-01-09 NOTE — Progress Notes (Addendum)
PROGRESS NOTE  [name] SWN:[id number] DOB: 01/20/1951 DOA: 01/08/2022 PCP: [name], MD  Brief History:  71 year old male with a history of pancreatic adenocarcinoma, colon adenocarcinoma status post resection May 2012, adenocarcinoma of the left lung status post resection, iron deficiency anemia, pulmonary embolus on apixaban, hypertension, CKD stage III, GERD, remote tobacco abuse presenting with 1 week history of generalized weakness and shortness of breath.  The patient stated that has significantly worsened on the day prior to admission.  He denies any coughing or hemoptysis.  The patient denies any fevers, chills, chest pain, nausea, vomiting, abdominal pain, dysuria, hematuria.  He denies any hematochezia but states that he has occasional melena.  He takes iron supplementation.[name]  He states that his overall appetite is poor.  He denies any recent changes in any of his medications. Notably, the patient last received chemotherapy for his pancreatic adenocarcinoma on 01/01/2022. In the ED, the patient was afebrile with soft blood pressures.  His SBP were in the 80s.  WBC 7.3, hemoglobin 7.2, platelets 87,000.  Sodium 134, potassium 4.4, bicarbonate 19, serum creatinine 1.36.  EKG shows sinus rhythm with nonspecific T wave changes.  Chest x-ray showed no consolidation and no pulmonary edema.  The patient was transfused 2 units PRBC.  FOBT was positive.  GI was consulted to assist with management.    Assessment and Plan: * Symptomatic anemia 2 units PRBC ordered GI consulted Hemoglobin has gradually drifted down from 12.4 (10/23/21) to 7.2 (01/08/22) In part due to myelosuppression from chemotherapy Holding apixaban temporarily  Iron deficiency anemia Continue Fe supplement  Pancytopenia due to antineoplastic chemotherapy ([place]) Last FOLFIRINOX 01/01/22 Serial CBC  Chronic abdominal pain Patient has chronic left-sided abdominal pain Previously attributable to the  patient's malignancy Unchanged in the past month 01/06/2022 MRI abdomen--area of restricted diffusion in the distal pancreas 2.0 x 1.9 cm.  slightly smaller mass in the splenic hilum  Heme positive stool GI has been consulted Patient normally takes iron supplementation Continue pantoprazole  Pancreatic cancer ([employer]) Follows with Dr. Delton Coombes Last FOLFIRINOX 01/01/22  B12 deficiency Continue supplementation  CKD (chronic kidney disease) stage 3, GFR 30-59 ml/min (HCC) Baseline creatinine 1.3-1.6 Serial BMP  Hx of colon cancer, stage III Status post colon resection May 2012  Diarrhea Check cdiff Stool pathogen panel  HTN (hypertension) No longer take carvedilol Patient states the blood pressures have remained soft at home with SBP in the 90s  Personal history of PE (pulmonary embolism) Taking apixaban prior to admission Apixaban on hold temporarily due to symptomatic anemia       Family Communication:   no Family at bedside  Consultants:  GI  Code Status:  FULL   DVT Prophylaxis: apixaban on hold   Procedures: As Listed in Progress Note Above  Antibiotics: None      Subjective: Patient denies fevers, chills, headache, chest pain, dyspnea, nausea, vomiting, diarrhea, abdominal pain, dysuria, hematuria, hematochezia, and melena.   Objective: Vitals:   01/09/22 0315 01/09/22 0330 01/09/22 0345 01/09/22 0400  BP: 98/61 104/65 (!) 85/60 (!) 89/58  Pulse: 72 77 76 73  Resp: 17 (!) 32 15 12  Temp:    98 F (36.7 C)  TempSrc:    Oral  SpO2: 96% 96% 96% 95%  Weight:      Height:        Intake/Output Summary (Last 24 hours) at 01/09/2022 0825 Last data filed at 01/09/2022 0356 Gross per 24  hour  Intake 653 ml  Output --  Net 653 ml   Weight change:  Exam:  General:  Pt is alert, follows commands appropriately, not in acute distress HEENT: No icterus, No thrush, No neck mass, [place]/AT Cardiovascular: RRR, S1/S2, no rubs, no  gallops Respiratory: CTA bilaterally, no wheezing, no crackles, no rhonchi Abdomen: Soft/+BS, non tender, non distended, no guarding Extremities: No edema, No lymphangitis, No petechiae, No rashes, no synovitis   Data Reviewed: I have personally reviewed following labs and imaging studies Basic Metabolic Panel: Recent Labs  Lab 01/08/22 1347  NA 134*  K 4.4  CL 108  CO2 19*  GLUCOSE 100*  BUN 12  CREATININE 1.36*  CALCIUM 7.7*  MG 1.7   Liver Function Tests: Recent Labs  Lab 01/08/22 1347  AST 20  ALT 11  ALKPHOS 132*  BILITOT 0.3  PROT 5.1*  ALBUMIN 2.5*   No results for input(s): "LIPASE", "AMYLASE" in the last 168 hours. No results for input(s): "AMMONIA" in the last 168 hours. Coagulation Profile: No results for input(s): "INR", "PROTIME" in the last 168 hours. CBC: Recent Labs  Lab 01/08/22 1347 01/09/22 0602  WBC 7.3 5.6  NEUTROABS 6.0  --   HGB 7.2* 8.9*  HCT 22.3* 27.2*  MCV 99.1 93.5  PLT 87* 72*   Cardiac Enzymes: No results for input(s): "CKTOTAL", "CKMB", "CKMBINDEX", "TROPONINI" in the last 168 hours. BNP: Invalid input(s): "POCBNP" CBG: No results for input(s): "GLUCAP" in the last 168 hours. HbA1C: No results for input(s): "HGBA1C" in the last 72 hours. Urine analysis:    Component Value Date/Time   COLORURINE AMBER (A) 01/08/2022 1405   APPEARANCEUR HAZY (A) 01/08/2022 1405   LABSPEC 1.020 01/08/2022 1405   PHURINE 5.0 01/08/2022 1405   GLUCOSEU NEGATIVE 01/08/2022 1405   HGBUR NEGATIVE 01/08/2022 1405   BILIRUBINUR NEGATIVE 01/08/2022 1405   KETONESUR NEGATIVE 01/08/2022 1405   PROTEINUR 30 (A) 01/08/2022 1405   UROBILINOGEN 0.2 11/30/2014 1115   NITRITE NEGATIVE 01/08/2022 1405   LEUKOCYTESUR NEGATIVE 01/08/2022 1405   Sepsis Labs: @LABRCNTIP (procalcitonin:4,lacticidven:4) )No results found for this or any previous visit (from the past 240 hour(s)).   Scheduled Meds:  atorvastatin  20 mg Oral Daily   gabapentin  100 mg  Oral QHS   lipase/protease/amylase  108,000 Units Oral TID WC   mirtazapine  30 mg Oral QHS   pantoprazole  40 mg Oral BID AC   Continuous Infusions:  Procedures/Studies: DG Chest Port 1 View  Result Date: 01/08/2022 CLINICAL DATA:  Shortness of breath EXAM: PORTABLE CHEST 1 VIEW COMPARISON:  Previous studies including the CT done on 05/21/2021 and chest radiograph done on 04/18/2020, PET-CT done on the no 10/01/2021 FINDINGS: Cardiac size is within normal limits. There is decreased volume in the left lung. Surgical staples are seen in left mid and left lower lung fields. Linear densities in left lower lung fields suggest scarring. There are no signs of pulmonary edema or focal pulmonary consolidation. There is no significant pleural effusion or pneumothorax. Tip of left subclavian chest port is seen in the region of superior vena cava. IMPRESSION: Previous partial resection of left lung. There are no signs of pulmonary edema or focal pulmonary consolidation. Electronically Signed   By: Elmer Picker M.D.   On: 01/08/2022 14:06   MR Abdomen W Wo Contrast  Result Date: 01/06/2022 CLINICAL DATA:  Assess for progression of pancreatic neoplasm in a 71 year old male. EXAM: MRI ABDOMEN WITHOUT AND WITH CONTRAST TECHNIQUE: Multiplanar multisequence  MR imaging of the abdomen was performed both before and after the administration of intravenous contrast. CONTRAST:  51mL GADAVIST GADOBUTROL 1 MMOL/ML IV SOLN COMPARISON:  October 01, 2021 PET evaluation and previous abdominal and pelvic CT of June of 2021. FINDINGS: Lower chest: Incidental imaging of the lung bases is unremarkable to the extent evaluated on this abdominal MRI. Hepatobiliary: Marked hepatic iron deposition with low signal throughout the liver. Signs of biliary dilation which has been present since June of 2021. Biliary enteric anatomy is of uncertain tight in this patient with history of [place] 1. Dilated bowel loops in the upper abdomen  leading 2 susceptibility artifact which limits assessment in addition to motion further limiting assessment of the biliary tree which is quite similar grossly in terms of degree of distension compared to more remote priors. There are no suspicious hepatic lesions within the limitations of the current study, limited by iron deposition in the liver and susceptibility artifact of adjacent distended bowel loops which are chronically distended. Pancreas: Pancreas is difficult to evaluate given presence of marked susceptibility artifact in the area of the upper abdomen due to distended bowel loops. Area of restricted diffusion in the distal pancreas measuring 2.0 x 1.9 cm previously approximately 1.8 x 1.9 cm in this area. Area with larger masslike appearance of the proximal body and tail junction has resolved. Spleen: Iron deposition in the spleen with signs of splenomegaly. Splenic vein may be occluded. Short gastrics are prominent. Adrenals/Urinary Tract: Marked iron deposition in the adrenal glands. Renal cortical scarring bilaterally with simple cyst arising from anterior RIGHT kidney for which no additional follow-up imaging is recommended. This measures 5.8 x 4.2 cm. No hydronephrosis or perinephric stranding. Stomach/Bowel: Chronically distended bowel loops throughout the abdomen. Signs of prior gastroenteric anastomosis and partial gastrectomy reportedly related to [place] 1. Biliary-enteric anatomy is of uncertain type and is stated above the biliary tree remains chronically dilated. Vascular/Lymphatic: Narrowing and or occlusion of the splenic vein due to mass in the splenic hilum. Other:  No ascites. Musculoskeletal: Iron deposition in marrow spaces throughout the visualized spine and pelvis. IMPRESSION: 1. Area of restricted diffusion in the distal pancreas measuring 2.0 x 1.9 cm previously approximately 1.8 x 1.9 cm in this area, also with enhancing tissue remaining in this area. Resolution of the more  masslike area in the lesser sac and distal pancreas seen on previous imaging. Is unclear whether this additional area represented changes related to pancreatitis or neoplasm. There is still a residual mass at the splenic hilum on today's study that may be slightly smaller. Overall there is improvement in the appearance of the pancreas. Suggest close attention on subsequent imaging. 2. Biliary enteric anatomy is of uncertain type and is stated above the biliary tree remains chronically dilated compared to more remote priors. Correlate with any clinical or laboratory evidence of hepatic dysfunction or biliary obstruction as the biliary tree is not well evaluated but grossly similar to prior imaging. 3. Hepatic, splenic, marrow space and adrenal iron deposition could be related to severe iron overload in the setting of exogenous iron administration but this pattern is uncommon with exogenous iron administration given the severity of adrenal iron deposition and should be correlated with any history of primary hemochromatosis. 4. Splenic vein may be occluded. 5. Overall the study is compromised by bowel distension, artifact and respiratory motion as outlined above. Electronically Signed   By: Zetta Bills M.D.   On: 01/06/2022 19:12   MR 3D Recon At Scanner  Result Date: 01/06/2022 CLINICAL DATA:  Assess for progression of pancreatic neoplasm in a 71 year old male. EXAM: MRI ABDOMEN WITHOUT AND WITH CONTRAST TECHNIQUE: Multiplanar multisequence MR imaging of the abdomen was performed both before and after the administration of intravenous contrast. CONTRAST:  37mL GADAVIST GADOBUTROL 1 MMOL/ML IV SOLN COMPARISON:  October 01, 2021 PET evaluation and previous abdominal and pelvic CT of June of 2021. FINDINGS: Lower chest: Incidental imaging of the lung bases is unremarkable to the extent evaluated on this abdominal MRI. Hepatobiliary: Marked hepatic iron deposition with low signal throughout the liver. Signs of biliary  dilation which has been present since June of 2021. Biliary enteric anatomy is of uncertain tight in this patient with history of [place] 1. Dilated bowel loops in the upper abdomen leading 2 susceptibility artifact which limits assessment in addition to motion further limiting assessment of the biliary tree which is quite similar grossly in terms of degree of distension compared to more remote priors. There are no suspicious hepatic lesions within the limitations of the current study, limited by iron deposition in the liver and susceptibility artifact of adjacent distended bowel loops which are chronically distended. Pancreas: Pancreas is difficult to evaluate given presence of marked susceptibility artifact in the area of the upper abdomen due to distended bowel loops. Area of restricted diffusion in the distal pancreas measuring 2.0 x 1.9 cm previously approximately 1.8 x 1.9 cm in this area. Area with larger masslike appearance of the proximal body and tail junction has resolved. Spleen: Iron deposition in the spleen with signs of splenomegaly. Splenic vein may be occluded. Short gastrics are prominent. Adrenals/Urinary Tract: Marked iron deposition in the adrenal glands. Renal cortical scarring bilaterally with simple cyst arising from anterior RIGHT kidney for which no additional follow-up imaging is recommended. This measures 5.8 x 4.2 cm. No hydronephrosis or perinephric stranding. Stomach/Bowel: Chronically distended bowel loops throughout the abdomen. Signs of prior gastroenteric anastomosis and partial gastrectomy reportedly related to [place] 1. Biliary-enteric anatomy is of uncertain type and is stated above the biliary tree remains chronically dilated. Vascular/Lymphatic: Narrowing and or occlusion of the splenic vein due to mass in the splenic hilum. Other:  No ascites. Musculoskeletal: Iron deposition in marrow spaces throughout the visualized spine and pelvis. IMPRESSION: 1. Area of restricted  diffusion in the distal pancreas measuring 2.0 x 1.9 cm previously approximately 1.8 x 1.9 cm in this area, also with enhancing tissue remaining in this area. Resolution of the more masslike area in the lesser sac and distal pancreas seen on previous imaging. Is unclear whether this additional area represented changes related to pancreatitis or neoplasm. There is still a residual mass at the splenic hilum on today's study that may be slightly smaller. Overall there is improvement in the appearance of the pancreas. Suggest close attention on subsequent imaging. 2. Biliary enteric anatomy is of uncertain type and is stated above the biliary tree remains chronically dilated compared to more remote priors. Correlate with any clinical or laboratory evidence of hepatic dysfunction or biliary obstruction as the biliary tree is not well evaluated but grossly similar to prior imaging. 3. Hepatic, splenic, marrow space and adrenal iron deposition could be related to severe iron overload in the setting of exogenous iron administration but this pattern is uncommon with exogenous iron administration given the severity of adrenal iron deposition and should be correlated with any history of primary hemochromatosis. 4. Splenic vein may be occluded. 5. Overall the study is compromised by bowel distension, artifact and  respiratory motion as outlined above. Electronically Signed   By: Zetta Bills M.D.   On: 01/06/2022 19:12    Orson Eva, DO  [facility]  If 7PM-7AM, please contact night-coverage [email or web address] Password [email or web address] 01/09/2022, 8:25 AM   LOS: 0 days

## 2022-01-09 NOTE — Consult Note (Signed)
Consulting  Provider: Dr. Carles Collet Primary Care Physician:  [name], MD Primary Gastroenterologist:  Dr. [name]  Reason for Consultation: Anemia, diarrhea  HPI:  [name] is a 71 y.o. 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      In the ER found to have a hemoglobin of 7.2, status post PRBCs, now 8.9, platelets 72, Hemoccult positive. Has chronic anemia followed by oncology, chronically on iron.  Does note dark stools though just related this to iron therapy.  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  History of C. difficile in the past.  Some abdominal pain which she states is chronic related to his pancreatic adenocarcinoma.  Some nausea related to chemotherapy.  No dysphagia odynophagia.  No chronic NSAID use.  Last EGD 03/03/2020 with altered gastric anatomy, esophageal biopsies with acid reflux changes.  Last flexible sigmoidoscopy 2018 unremarkable besides surgical anastomosis which appeared healthy.  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   Stage 3   Anemia    Anxiety    Arthritis    Barrett's esophagus    Blood transfusion    Bowel obstruction ([place]) 05/13/2012   Recurrent   Bronchitis    Chest pain at rest    Chronic abdominal pain    Erosive esophagitis    ETOH abuse    quit 03/2010   GERD (gastroesophageal reflux disease)    Hx of Clostridium difficile infection 01/2012   Hypertension    Ileus ([place])    Iron deficiency anemia 03/23/2016   Lung cancer ([place])    Obstruction of  bowel ([place]) 03/03/2014   Osteoporosis    Pancreatic cancer ([facility])    Personal history of PE (pulmonary embolism) 10/01/2010   Pneumonia    Pulmonary embolism ([place]) 02/2010   Recurrent upper respiratory infection (URI)    Renal disorder    S/P endoscopy 09/28/2010   erosive reflux esophagitis, Billroth I anatomy   S/P partial gastrectomy 1980s   Seizures ([place])    was on meds for 6 months, Unknown etiology, last seizure was 2017.   Shortness of breath    TIA (transient ischemic attack) 12/2009   Vitamin B12 deficiency     Past Surgical History:  Procedure Laterality Date   ABDOMINAL ADHESION SURGERY  03/04/15   @ [facility]   ABDOMINAL EXPLORATION SURGERY     abdominal sugery     for bowel obstruction x 8, all in 1980s, except for one in 07/2010   APPENDECTOMY  1980s   Billroth 1 hemigastrectomy  1980s   per patient for benign duodenal tumor   CARDIAC CATHETERIZATION  07/17/2012   CHOLECYSTECTOMY  1980s   COLON SURGERY  May 2012   left hemicolectomy, colon cancer found at time of surgery for bowel obstruction   COLON SURGERY  10/12/2018   At [facility]: Resection of right colon and terminal ileum with creation of ileorectal anastomosis for large bowel obstruction.  Cecum and ascending colon noted to be attached to the rectum.   COLONOSCOPY  03/18/2011   anastomosis at 35cm. Several adenomatous polyps removed. Sigmoid diverticulosis. Next TCS 02/2013  COLONOSCOPY N/A 07/24/2012   MVE:HMCNOB post segmental resection with normal-appearing colonic anastomosis aside from an adjacent polyp-removed as described above. Rectal polyp-removed as described above. CT findings appear to have been artifactual. tubular adenomas/prolapsed type polyp.   COLONOSCOPY N/A 05/15/2015   Procedure: COLONOSCOPY;  Surgeon: [name], MD;  [facility];  Service: Endoscopy;  Laterality: N/A;   COLONOSCOPY  09/26/2018   at [facility]: prep not adequate for colon cancer surveillance.  Prior end-to-end colonic  anastomosis in the rectosigmoid region.  This was patent and characterized by healthy-appearing mucosa.  Anastomosis was traversed.  Normal terminal ileum.   COLONOSCOPY WITH PROPOFOL N/A 11/04/2016   Dr. [name], status post subtotal colectomy with normal-appearing residual lower GI tract.  Next colonoscopy in 5 years.   ESOPHAGOGASTRODUODENOSCOPY  09/28/2010   ESOPHAGOGASTRODUODENOSCOPY  12/01/2010   Cervical web status post dilation, erosive esophagitis, B1 hemigastrectomy, inflamed anastomosis   ESOPHAGOGASTRODUODENOSCOPY  04/16/2011   excoriation at [facility] c/w trauma/M-W tear, friable gastric anastomosis, dilation efferent limb   ESOPHAGOGASTRODUODENOSCOPY N/A 06/03/2014   Dr.[name]- cervcal esopphageal web s/p dilation. abnormal distal esophagus bx= barretts esophagus   ESOPHAGOGASTRODUODENOSCOPY N/A 05/15/2015   Procedure: ESOPHAGOGASTRODUODENOSCOPY (EGD);  Surgeon: [name], MD;  [facility];  Service: Endoscopy;  Laterality: N/A;  230   ESOPHAGOGASTRODUODENOSCOPY (EGD) WITH ESOPHAGEAL DILATION  02/25/2012   SJG:GEZMOQHU esophageal web-s/p dilation anddisruption as described above. Status post prior gastric with Billroth I configuration. Abnormal gastric mucosa at the anastomosis. Gastric biopsy showed mild chronic inflammation but no H. pylori    ESOPHAGOGASTRODUODENOSCOPY (EGD) WITH PROPOFOL N/A 11/04/2016   Dr. [name]: Reflux esophagitis, small hiatal hernia status post hemigastrectomy.  Single patent efferent small bowel limb appeared normal, 2 x 2 centimeter tongue of salmon epithelium again seen, esophageal dilation.  Biopsies consistent with reflux changes, not Barrett's however this was confirmed on prior EGDs.  Offer 3-year follow-up EGD August 2021.   ESOPHAGOGASTRODUODENOSCOPY (EGD) WITH PROPOFOL N/A 03/03/2020   Procedure: ESOPHAGOGASTRODUODENOSCOPY (EGD) WITH PROPOFOL;  Surgeon: [name], MD;  [facility];  Service: Endoscopy;  Laterality: N/A;  3:00pm    HERNIA REPAIR     right inguinal   MALONEY DILATION N/A 06/03/2014   Procedure: [name] DILATION;  Surgeon: [name], MD;  [facility];  Service: Endoscopy;  Laterality: N/A;   MALONEY DILATION N/A 05/15/2015   Procedure: [name] DILATION;  Surgeon: [name], MD;  [facility];  Service: Endoscopy;  Laterality: N/A;   MALONEY DILATION N/A 11/04/2016   Procedure: [name] DILATION;  Surgeon: [name], MD;  [facility];  Service: Endoscopy;  Laterality: N/A;   MALONEY DILATION N/A 03/03/2020   Procedure: [name] DILATION;  Surgeon: [name], MD;  [facility];  Service: Endoscopy;  Laterality: N/A;   PORTACATH PLACEMENT     SAVORY DILATION N/A 06/03/2014   Procedure: SAVORY DILATION;  Surgeon: [name], MD;  [facility];  Service: Endoscopy;  Laterality: N/A;    Prior to Admission medications   Medication Sig Start Date End Date Taking? Authorizing Provider  apixaban (ELIQUIS) 5 MG TABS tablet Take 1 tablet (5 mg total) by mouth 2 (two) times daily. 10/19/21  Yes Derek Jack, MD  atorvastatin (LIPITOR) 20 MG tablet Take 20 mg by mouth daily.   Yes [provider]  Bacillus Coagulans-Inulin (PROBIOTIC) 1-250 BILLION-MG CAPS Take 1 capsule by mouth daily.   Yes [provider]  chlorproMAZINE (THORAZINE) 25 MG tablet Take 1 tablet (25 mg total) by mouth every 6 (six) hours as needed. Patient taking differently: Take 25 mg by mouth every 6 (six) hours as needed for nausea or vomiting. 01/01/22  Yes Derek Jack, MD  Cholecalciferol (VITAMIN D) 2000 units CAPS Take 2,000 Units by mouth daily.   Yes [provider]  Cholecalciferol (VITAMIN D3) 10 MCG (400 UNIT) tablet Take 400 Units by mouth daily.   Yes [provider]  CREON 36000-114000 units CPEP capsule Take 72,000-108,000 Units by mouth See admin instructions. Take 3 capsules (108,000 units) by mouth with  meals and take 2 capsules (72,000 units) with snacks. 11/18/21  Yes [provider]  cyanocobalamin 100 MCG tablet Take by mouth.   Yes [provider]  diphenoxylate-atropine (LOMOTIL) 2.5-0.025 MG tablet Take 1 tablet by mouth 4 times daily 12/24/21  Yes Mahala Menghini, PA-C  ferrous sulfate 325 (65 FE) MG EC tablet Take by mouth.   Yes [provider]  gabapentin (NEURONTIN) 100 MG capsule TAKE 1 CAPSULE BY MOUTH EVERY DAY AT BEDTIME Patient taking differently: Take 100 mg by mouth at bedtime. 10/22/21  Yes Derek Jack, MD  HYDROcodone-acetaminophen (NORCO/VICODIN) 5-325 MG tablet Take 1 tablet by mouth every 8 (eight) hours as needed for moderate pain. 12/24/21  Yes Derek Jack, MD  loperamide (IMODIUM A-D) 2 MG tablet Take 2 mg by mouth in the morning and at bedtime.   Yes [provider]  magnesium oxide (MAG-OX) 400 (240 Mg) MG tablet Take 1 tablet (400 mg total) by mouth 2 (two) times daily. 10/27/21  Yes Derek Jack, MD  mirtazapine (REMERON) 30 MG tablet TAKE 1 TABLET BY MOUTH AT BEDTIME 11/16/21  Yes Derek Jack, MD  Multiple Vitamin (MULTIVITAMIN WITH MINERALS) TABS tablet Take 1 tablet by mouth daily.   Yes [provider]  ondansetron (ZOFRAN-ODT) 4 MG disintegrating tablet TAKE 1 TABLET BY MOUTH EVERY 4 HOURS AS NEEDED FOR NAUSEA AND VOMITING Patient taking differently: Take 4 mg by mouth every 4 (four) hours as needed for vomiting or nausea. 08/22/20  Yes Mahala Menghini, PA-C  pantoprazole (PROTONIX) 40 MG tablet Take 1 tablet (40 mg total) by mouth 2 (two) times daily before a meal. 06/15/21  Yes Mahala Menghini, PA-C  PREVALITE 4 g packet Take 1 packet (4 g total) by mouth daily. DO NOT TAKE WITHIN 2 HOURS OF OTHER MEDICATIONS 08/04/21  Yes Mahala Menghini, PA-C  prochlorperazine (COMPAZINE) 10 MG tablet Take 10 mg by mouth every 6 (six) hours as needed for vomiting or nausea.   Yes [provider]   sucralfate (CARAFATE) 1 GM/10ML suspension TAKE 10 MLS (1 G TOTAL) BY MOUTH 4 (FOUR) TIMES DAILY AS NEEDED. [place] HEARTBURN Patient taking differently: Take 1 g by mouth 4 (four) times daily as needed (breakthrough heartburn). 10/22/20  Yes Mahala Menghini, PA-C  traMADol (ULTRAM) 50 MG tablet Take 1 tablet by mouth every 6 (six) hours as needed for moderate pain or severe pain. 09/07/21  Yes [provider]  carvedilol (COREG) 25 MG tablet Take 0.5 tablets (12.5 mg total) by mouth 2 (two) times daily with a meal. Patient not taking: Reported on 01/08/2022 05/06/19   Roxan Hockey, MD  lidocaine-prilocaine (EMLA) cream Apply to port site 1 hour prior to port access 10/13/21 11/22/21  Derek Jack, MD    Current Facility-Administered Medications  Medication Dose Route Frequency Provider Last Rate Last Admin  acetaminophen (TYLENOL) tablet 650 mg  650 mg Oral Q6H PRN Tat, Shanon Brow, MD       atorvastatin (LIPITOR) tablet 20 mg  20 mg Oral Daily Truett Mainland, DO   20 mg at 01/09/22 0932   chlorproMAZINE (THORAZINE) tablet 25 mg  25 mg Oral Q6H PRN Truett Mainland, DO       gabapentin (NEURONTIN) capsule 100 mg  100 mg Oral QHS Truett Mainland, DO       HYDROcodone-acetaminophen (NORCO/VICODIN) 5-325 MG per tablet 1 tablet  1 tablet Oral Q8H PRN Truett Mainland, DO   1 tablet at 01/09/22 2706   lipase/protease/amylase (CREON) capsule 108,000 Units  108,000 Units Oral TID WC Truett Mainland, DO       lipase/protease/amylase (CREON) capsule 72,000 Units  72,000 Units Oral PRN Truett Mainland, DO       mirtazapine (REMERON) tablet 30 mg  30 mg Oral QHS Stinson, Jacob J, DO       ondansetron [facility]) tablet 4 mg  4 mg Oral Q6H PRN Truett Mainland, DO       Or   ondansetron [facility]) injection 4 mg  4 mg Intravenous Q6H PRN Truett Mainland, DO       pantoprazole (PROTONIX) EC tablet 40 mg  40 mg Oral BID AC Truett Mainland, DO   40 mg at 01/09/22 2376   prochlorperazine  (COMPAZINE) tablet 10 mg  10 mg Oral Q6H PRN Truett Mainland, DO       Current Outpatient Medications  Medication Sig Dispense Refill   apixaban (ELIQUIS) 5 MG TABS tablet Take 1 tablet (5 mg total) by mouth 2 (two) times daily. 60 tablet 6   atorvastatin (LIPITOR) 20 MG tablet Take 20 mg by mouth daily.     Bacillus Coagulans-Inulin (PROBIOTIC) 1-250 BILLION-MG CAPS Take 1 capsule by mouth daily.     chlorproMAZINE (THORAZINE) 25 MG tablet Take 1 tablet (25 mg total) by mouth every 6 (six) hours as needed. (Patient taking differently: Take 25 mg by mouth every 6 (six) hours as needed for nausea or vomiting.) 30 tablet 0   Cholecalciferol (VITAMIN D) 2000 units CAPS Take 2,000 Units by mouth daily.     Cholecalciferol (VITAMIN D3) 10 MCG (400 UNIT) tablet Take 400 Units by mouth daily.     CREON 36000-114000 units CPEP capsule Take 72,000-108,000 Units by mouth See admin instructions. Take 3 capsules (108,000 units) by mouth with meals and take 2 capsules (72,000 units) with snacks.     cyanocobalamin 100 MCG tablet Take by mouth.     diphenoxylate-atropine (LOMOTIL) 2.5-0.025 MG tablet Take 1 tablet by mouth 4 times daily 120 tablet 0   ferrous sulfate 325 (65 FE) MG EC tablet Take by mouth.     gabapentin (NEURONTIN) 100 MG capsule TAKE 1 CAPSULE BY MOUTH EVERY DAY AT BEDTIME (Patient taking differently: Take 100 mg by mouth at bedtime.) 30 capsule 6   HYDROcodone-acetaminophen (NORCO/VICODIN) 5-325 MG tablet Take 1 tablet by mouth every 8 (eight) hours as needed for moderate pain. 45 tablet 0   loperamide (IMODIUM A-D) 2 MG tablet Take 2 mg by mouth in the morning and at bedtime.     magnesium oxide (MAG-OX) 400 (240 Mg) MG tablet Take 1 tablet (400 mg total) by mouth 2 (two) times daily. 60 tablet 2   mirtazapine (REMERON) 30 MG tablet TAKE 1 TABLET BY MOUTH AT BEDTIME 30 tablet 3   Multiple Vitamin (  MULTIVITAMIN WITH MINERALS) TABS tablet Take 1 tablet by mouth daily.     ondansetron  (ZOFRAN-ODT) 4 MG disintegrating tablet TAKE 1 TABLET BY MOUTH EVERY 4 HOURS AS NEEDED FOR NAUSEA AND VOMITING (Patient taking differently: Take 4 mg by mouth every 4 (four) hours as needed for vomiting or nausea.) 30 tablet 1   pantoprazole (PROTONIX) 40 MG tablet Take 1 tablet (40 mg total) by mouth 2 (two) times daily before a meal. 180 tablet 3   PREVALITE 4 g packet Take 1 packet (4 g total) by mouth daily. DO NOT TAKE WITHIN 2 HOURS OF OTHER MEDICATIONS 90 packet 3   prochlorperazine (COMPAZINE) 10 MG tablet Take 10 mg by mouth every 6 (six) hours as needed for vomiting or nausea.     sucralfate (CARAFATE) 1 GM/10ML suspension TAKE 10 MLS (1 G TOTAL) BY MOUTH 4 (FOUR) TIMES DAILY AS NEEDED. FOR BREAKTHROUGH HEARTBURN (Patient taking differently: Take 1 g by mouth 4 (four) times daily as needed (breakthrough heartburn).) 420 mL 5   traMADol (ULTRAM) 50 MG tablet Take 1 tablet by mouth every 6 (six) hours as needed for moderate pain or severe pain.     carvedilol (COREG) 25 MG tablet Take 0.5 tablets (12.5 mg total) by mouth 2 (two) times daily with a meal. (Patient not taking: Reported on 01/08/2022) 15 tablet 2   Facility-Administered Medications Ordered in Other Encounters  Medication Dose Route Frequency Provider Last Rate Last Admin   acetaminophen (TYLENOL) 325 MG tablet            heparin lock flush 100 unit/mL  500 Units Intravenous Once Kefalas, Thomas S, PA-C       loratadine (CLARITIN) 10 MG tablet            sodium chloride flush (NS) 0.9 % injection 10 mL  10 mL Intravenous PRN Holley Bouche, NP   10 mL at 11/05/16 1136    Allergies as of 01/08/2022   (No Known Allergies)    Family History  Problem Relation Age of Onset   Hypertension Mother    Arthritis Mother    Pneumonia Mother    Hypertension Father    Heart attack Father    Bone cancer Cousin    Colon cancer Neg Hx     Social History   Socioeconomic History   Marital status: Married    Spouse name: Not on  file   Number of children: 3   Years of education: Not on file   Highest education level: Not on file  Occupational History    Employer: [employer]  Tobacco Use   Smoking status: Former    Packs/day: 0.50    Years: 40.00    Total pack years: 20.00    Types: Cigarettes    Quit date: 12/20/2012    Years since quitting: 9.0   Smokeless tobacco: Never  Vaping Use   Vaping Use: Never used  Substance and Sexual Activity   Alcohol use: No   Drug use: No   Sexual activity: Never  Other Topics Concern   Not on file  Social History Narrative   Not on file   Social Determinants of Health   Financial Resource Strain: Not on file  Food Insecurity: No Food Insecurity (01/09/2022)   Hunger Vital Sign    Worried About Running Out of Food in the Last Year: Never true    Ran Out of Food in the Last Year: Never true  Transportation Needs: No  Transportation Needs (01/09/2022)   PRAPARE - Hydrologist (Medical): No    Lack of Transportation (Non-Medical): No  Physical Activity: Not on file  Stress: Not on file  Social Connections: Not on file  Intimate Partner Violence: Not At Risk (01/09/2022)   Humiliation, Afraid, Rape, and Kick questionnaire    Fear of Current or Ex-Partner: No    Emotionally Abused: No    Physically Abused: No    Sexually Abused: No    Review of Systems: General: Negative for anorexia, weight loss, fever, chills, fatigue, weakness. Eyes: Negative for vision changes.  ENT: Negative for hoarseness, difficulty swallowing , nasal congestion. CV: Negative for chest pain, angina, palpitations, dyspnea on exertion, peripheral edema.  Respiratory: Negative for dyspnea at rest, dyspnea on exertion, cough, sputum, wheezing.  GI: See history of present illness. GU:  Negative for dysuria, hematuria, urinary incontinence, urinary frequency, nocturnal urination.  MS: Negative for joint pain, low back pain.  Derm: Negative for rash or itching.   Neuro: Negative for weakness, abnormal sensation, seizure, frequent headaches, memory loss, confusion.  Psych: Negative for anxiety, depression Endo: Negative for unusual weight change.  Heme: Negative for bruising or bleeding. Allergy: Negative for rash or hives.  Physical Exam: Vital signs in last 24 hours: Temp:  [97.4 F (36.3 C)-98.3 F (36.8 C)] 98 F (36.7 C) (10/21 0835) Pulse Rate:  [69-113] 69 (10/21 0835) Resp:  [12-32] 17 (10/21 0835) BP: (80-140)/(49-86) 97/63 (10/21 0835) SpO2:  [94 %-100 %] 100 % (10/21 0835) Weight:  [52.2 kg] 52.2 kg (10/20 1258) Last BM Date : 01/09/22 General:   Alert,  Well-developed, well-nourished, pleasant and cooperative in NAD Head:  Normocephalic and atraumatic. Eyes:  Sclera clear, no icterus.   Conjunctiva pink. Ears:  Normal auditory acuity. Nose:  No deformity, discharge,  or lesions. Mouth:  No deformity or lesions, dentition normal. Neck:  Supple; no masses or thyromegaly. Lungs:  Clear throughout to auscultation.   No wheezes, crackles, or rhonchi. No acute distress. Heart:  Regular rate and rhythm; no murmurs, clicks, rubs,  or gallops. Abdomen:  Soft, nontender and nondistended. No masses, hepatosplenomegaly or hernias noted. Normal bowel sounds, without guarding, and without rebound.   Msk:  Symmetrical without gross deformities. Normal posture. Pulses:  Normal pulses noted. Extremities:  Without clubbing or edema. Neurologic:  Alert and  oriented x4;  grossly normal neurologically. Skin:  Intact without significant lesions or rashes. Cervical Nodes:  No significant cervical adenopathy. Psych:  Alert and cooperative. Normal mood and affect.  Intake/Output from previous day: 10/20 0701 - 10/21 0700 In: 653 [Blood:653] Out: -  Intake/Output this shift: No intake/output data recorded.  Lab Results: Recent Labs    01/08/22 1347 01/09/22 0602  WBC 7.3 5.6  HGB 7.2* 8.9*  HCT 22.3* 27.2*  PLT 87* 72*   BMET Recent  Labs    01/08/22 1347  NA 134*  K 4.4  CL 108  CO2 19*  GLUCOSE 100*  BUN 12  CREATININE 1.36*  CALCIUM 7.7*   LFT Recent Labs    01/08/22 1347  PROT 5.1*  ALBUMIN 2.5*  AST 20  ALT 11  ALKPHOS 132*  BILITOT 0.3   PT/INR No results for input(s): "LABPROT", "INR" in the last 72 hours. Hepatitis Panel No results for input(s): "HEPBSAG", "HCVAB", "HEPAIGM", "HEPBIGM" in the last 72 hours. C-Diff No results for input(s): "CDIFFTOX" in the last 72 hours.  Studies/Results: DG Chest Port 1 [address]  Result Date: 01/08/2022 CLINICAL DATA:  Shortness of breath EXAM: PORTABLE CHEST 1 VIEW COMPARISON:  Previous studies including the CT done on 05/22/2021 and chest radiograph done on 04/18/2020, PET-CT done on the no 10/01/2021 FINDINGS: Cardiac size is within normal limits. There is decreased volume in the left lung. Surgical staples are seen in left mid and left lower lung fields. Linear densities in left lower lung fields suggest scarring. There are no signs of pulmonary edema or focal pulmonary consolidation. There is no significant pleural effusion or pneumothorax. Tip of left subclavian chest port is seen in the region of superior vena cava. IMPRESSION: Previous partial resection of left lung. There are no signs of pulmonary edema or focal pulmonary consolidation. Electronically Signed   By: Elmer Picker M.D.   On: 01/08/2022 14:06    Impression: *Symptomatic anemia *Pancreatic adenocarcinoma *Adenocarcinoma of the left colon status post surgical resection *Chronic systemic anticoagulation with Eliquis *Pancreatic insufficiency *Chronic diarrhea-worsening  Plan: Patient without overt GI bleeding though does have heme positive stool and significant history.  We will plan on EGD and flexible sigmoidoscopy in the a.m. to further evaluate. The risks including infection, bleed, or perforation as well as benefits, limitations, alternatives and imponderables have been reviewed  with the patient. Potential for esophageal dilation, biopsy, etc. have also been reviewed.  Questions have been answered. All parties agreeable.  Continue monitor H&H and transfuse for less than 7.  PPI twice daily.  Continue to hold Eliquis for now, last dose 01/08/2022. N.p.o. after midnight.  Chronic diarrhea due to pancreatic insufficiency as well as abnormal GI anatomy though does note worsening recently.  GI pathogen panel and C. difficile testing pending.  Continue Creon.  Thank you for the consultation.  Elon Alas. Abbey Chatters, D.O. Gastroenterology and Hepatology [facility]    LOS: 0 days     01/09/2022, 11:55 AM

## 2022-01-10 ENCOUNTER — Telehealth: Payer: Self-pay | Admitting: Internal Medicine

## 2022-01-10 ENCOUNTER — Inpatient Hospital Stay (HOSPITAL_COMMUNITY): Payer: Medicare Other | Admitting: Anesthesiology

## 2022-01-10 ENCOUNTER — Encounter (HOSPITAL_COMMUNITY)
Admission: EM | Disposition: A | Discharge status: Home / Self Care | Payer: Self-pay | Source: Home / Self Care | Attending: Internal Medicine

## 2022-01-10 DIAGNOSIS — N1831 Chronic kidney disease, stage 3a: Secondary | ICD-10-CM | POA: Diagnosis not present

## 2022-01-10 DIAGNOSIS — E538 Deficiency of other specified B group vitamins: Secondary | ICD-10-CM | POA: Diagnosis not present

## 2022-01-10 DIAGNOSIS — D500 Iron deficiency anemia secondary to blood loss (chronic): Secondary | ICD-10-CM | POA: Diagnosis not present

## 2022-01-10 DIAGNOSIS — D649 Anemia, unspecified: Secondary | ICD-10-CM | POA: Diagnosis not present

## 2022-01-10 HISTORY — PX: ESOPHAGOGASTRODUODENOSCOPY (EGD) WITH PROPOFOL: SHX5813

## 2022-01-10 HISTORY — PX: BIOPSY: SHX5522

## 2022-01-10 HISTORY — PX: FLEXIBLE SIGMOIDOSCOPY: SHX5431

## 2022-01-10 LAB — TYPE AND SCREEN
ABO/RH(D): O POS
Antibody Screen: NEGATIVE
Unit division: 0
Unit division: 0

## 2022-01-10 LAB — BASIC METABOLIC PANEL
Anion gap: 6 (ref 5–15)
BUN: 10 mg/dL (ref 8–23)
CO2: 20 mmol/L — ABNORMAL LOW (ref 22–32)
Calcium: 7.8 mg/dL — ABNORMAL LOW (ref 8.9–10.3)
Chloride: 109 mmol/L (ref 98–111)
Creatinine, Ser: 1.25 mg/dL — ABNORMAL HIGH (ref 0.61–1.24)
GFR, Estimated: >60 mL/min (ref >60)
Glucose, Bld: 80 mg/dL (ref 70–99)
Potassium: 3.9 mmol/L (ref 3.5–5.1)
Sodium: 135 mmol/L (ref 135–145)

## 2022-01-10 LAB — BPAM RBC
Blood Product Expiration Date: 202311202359
Blood Product Expiration Date: 202311202359
ISSUE DATE / TIME: 202310202201
ISSUE DATE / TIME: 202310210148
Unit Type and Rh: 5100
Unit Type and Rh: 5100

## 2022-01-10 LAB — CBC
HCT: 28.5 % — ABNORMAL LOW (ref 39.0–52.0)
Hemoglobin: 9.4 g/dL — ABNORMAL LOW (ref 13.0–17.0)
MCH: 30.9 pg (ref 26.0–34.0)
MCHC: 33 g/dL (ref 30.0–36.0)
MCV: 93.8 fL (ref 80.0–100.0)
Platelets: 86 10*3/uL — ABNORMAL LOW (ref 150–400)
RBC: 3.04 MIL/uL — ABNORMAL LOW (ref 4.22–5.81)
RDW: 19 % — ABNORMAL HIGH (ref 11.5–15.5)
WBC: 6.3 10*3/uL (ref 4.0–10.5)
nRBC: 0.8 % — ABNORMAL HIGH (ref 0.0–0.2)

## 2022-01-10 LAB — MAGNESIUM: Magnesium: 1.9 mg/dL (ref 1.7–2.4)

## 2022-01-10 SURGERY — ESOPHAGOGASTRODUODENOSCOPY (EGD) WITH PROPOFOL
Anesthesia: General

## 2022-01-10 MED ORDER — PROPOFOL 10 MG/ML IV BOLUS
INTRAVENOUS | Status: AC
  Filled 2022-01-10: qty 20

## 2022-01-10 MED ORDER — APIXABAN 5 MG PO TABS: 5.0000 mg | ORAL_TABLET | Freq: Two times a day (BID) | ORAL | 6 refills | Status: DC

## 2022-01-10 MED ORDER — HEPARIN SOD (PORK) LOCK FLUSH 100 UNIT/ML IV SOLN
500.0000 [IU] | Freq: Once | INTRAVENOUS | Status: AC
  Administered 2022-01-10: 500 [IU] via INTRAVENOUS

## 2022-01-10 MED ORDER — PROPOFOL 10 MG/ML IV BOLUS
INTRAVENOUS | Status: DC | PRN
  Administered 2022-01-10: 140 mg via INTRAVENOUS

## 2022-01-10 MED ORDER — HEPARIN SOD (PORK) LOCK FLUSH 100 UNIT/ML IV SOLN
INTRAVENOUS | Status: AC
  Filled 2022-01-10: qty 5

## 2022-01-10 MED ORDER — LACTATED RINGERS IV SOLN: INTRAVENOUS | Status: DC | PRN

## 2022-01-10 NOTE — Progress Notes (Signed)
Deaccessed patients port a cath. Patient tolerated well. Gauze dressing in place

## 2022-01-10 NOTE — Anesthesia Preprocedure Evaluation (Signed)
Anesthesia Evaluation  Patient identified by MRN, date of birth, ID band Patient awake    Reviewed: Allergy & Precautions, H&P , NPO status , Patient's Chart, lab work & pertinent test results, reviewed documented beta blocker date and time   Airway Mallampati: II  TM Distance: >3 FB Neck ROM: full    Dental no notable dental hx.    Pulmonary shortness of breath, Recent URI , former smoker,    Pulmonary exam normal breath sounds clear to auscultation       Cardiovascular Exercise Tolerance: Good hypertension, negative cardio ROS   Rhythm:regular Rate:Normal     Neuro/Psych Seizures -,  PSYCHIATRIC DISORDERS Anxiety TIA   GI/Hepatic Neg liver ROS, PUD, GERD  Medicated,  Endo/Other  negative endocrine ROS  Renal/GU CRF and ARFRenal disease  negative genitourinary   Musculoskeletal   Abdominal   Peds  Hematology  (+) Blood dyscrasia, anemia ,   Anesthesia Other Findings   Reproductive/Obstetrics negative OB ROS                             Anesthesia Physical Anesthesia Plan  ASA: 4 and emergent  Anesthesia Plan: General   Post-op Pain Management:    Induction:   PONV Risk Score and Plan: Propofol infusion  Airway Management Planned:   Additional Equipment:   Intra-op Plan:   Post-operative Plan:   Informed Consent: I have reviewed the patients History and Physical, chart, labs and discussed the procedure including the risks, benefits and alternatives for the proposed anesthesia with the patient or authorized representative who has indicated his/her understanding and acceptance.     Dental Advisory Given  Plan Discussed with: CRNA  Anesthesia Plan Comments:         Anesthesia Quick Evaluation

## 2022-01-10 NOTE — Discharge Summary (Addendum)
Physician Discharge Summary   Patient: [name] MRN: [id number] DOB: 1950/07/17  Admit date:     01/08/2022  Discharge date: 01/10/22  Discharge Physician: Shanon Brow Swathi Dauphin   PCP: [name], MD   Recommendations at discharge:   Please follow up with primary care provider within 1-2 weeks  Please repeat BMP and CBC in one week   Hospital Course: 71 year old male with a history of pancreatic adenocarcinoma, colon adenocarcinoma status post resection May 2012, adenocarcinoma of the left lung status post resection, iron deficiency anemia, pulmonary embolus on apixaban, hypertension, CKD stage III, GERD, remote tobacco abuse presenting with 1 week history of generalized weakness and shortness of breath.  The patient stated that has significantly worsened on the day prior to admission.  He denies any coughing or hemoptysis.  The patient denies any fevers, chills, chest pain, nausea, vomiting, abdominal pain, dysuria, hematuria.  He denies any hematochezia but states that he has occasional melena.  He takes iron supplementation.[name]  He states that his overall appetite is poor.  He denies any recent changes in any of his medications. Notably, the patient last received chemotherapy for his pancreatic adenocarcinoma on 01/01/2022. In the ED, the patient was afebrile with soft blood pressures.  His SBP were in the 80s.  WBC 7.3, hemoglobin 7.2, platelets 87,000.  Sodium 134, potassium 4.4, bicarbonate 19, serum creatinine 1.36.  EKG shows sinus rhythm with nonspecific T wave changes.  Chest x-ray showed no consolidation and no pulmonary edema.  The patient was transfused 2 units PRBC.  FOBT was positive.  GI was consulted to assist with management.  Assessment and Plan: * Symptomatic anemia 2 units PRBC given>>Hgb 9.4 on day of dc GI consult appreciated Hemoglobin has gradually drifted down from 12.4 (10/23/21) to 7.2 (01/08/22) In part due to myelosuppression from chemotherapy Holding apixaban  temporarily>>restart 10/23 01/10/22 EGD>>- LA Grade A reflux esophagitis with no bleeding. -- Patent Billroth I gastroduodenostomy was found, characterized by edema. 01/10/22 flex sig>>- Patent side-to-side ileo-colonic anastomosis, characterized by healthy appearing mucosa. - Diffuse moderate inflammation was found at the colonic anastomosis. Suspect chemo induced enteritis  Iron deficiency anemia Continue Fe supplement Iron sat 74%, ferritin 979  Pancytopenia due to antineoplastic chemotherapy (HCC) Last FOLFIRINOX 01/01/22 Serial CBC  Chronic abdominal pain Patient has chronic left-sided abdominal pain Previously attributable to the patient's malignancy Unchanged in the past month 01/06/2022 MRI abdomen--area of restricted diffusion in the distal pancreas 2.0 x 1.9 cm.  slightly smaller mass in the splenic hilum  Heme positive stool GI has been consulted Patient normally takes iron supplementation Continue pantoprazole  Pancreatic cancer ([place]) Follows with Dr. Delton Coombes Last FOLFIRINOX 01/01/22  B12 deficiency Continue supplementation  CKD (chronic kidney disease) stage 3, GFR 30-59 ml/min (HCC) Baseline creatinine 1.3-1.6 Serial BMP  Hx of colon cancer, stage III Status post colon resection May 2012  Diarrhea Check cdiff--antigen positive, toxin neg>>with no fever, normal WBC reflect colonixation Stool pathogen panel--pending at time of d/c Loperamide prn  HTN (hypertension) No longer take carvedilol Patient states the blood pressures have remained soft at home with SBP in the 90s Personally rechecked myself 01/10/22 at 1145---HR 76 BP 100/69  Personal history of PE (pulmonary embolism) Taking apixaban prior to admission Apixaban on hold temporarily due to symptomatic anemia         Consultants: GI Procedures performed: EGD, flex sig  Disposition: Home Diet recommendation:  Regular diet DISCHARGE MEDICATION: Allergies as of 01/10/2022   No Known  Allergies  Medication List     STOP taking these medications    carvedilol 25 MG tablet Commonly known as: Coreg       TAKE these medications    apixaban 5 MG Tabs tablet Commonly known as: ELIQUIS Take 1 tablet (5 mg total) by mouth 2 (two) times daily. Restart 01/11/22 What changed: additional instructions   atorvastatin 20 MG tablet Commonly known as: LIPITOR Take 20 mg by mouth daily.   chlorproMAZINE 25 MG tablet Commonly known as: THORAZINE Take 1 tablet (25 mg total) by mouth every 6 (six) hours as needed. What changed: reasons to take this   Creon 36000 UNITS Cpep capsule Generic drug: lipase/protease/amylase Take 72,000-108,000 Units by mouth See admin instructions. Take 3 capsules (108,000 units) by mouth with meals and take 2 capsules (72,000 units) with snacks.   cyanocobalamin 100 MCG tablet Take by mouth.   diphenoxylate-atropine 2.5-0.025 MG tablet Commonly known as: LOMOTIL Take 1 tablet by mouth 4 times daily   ferrous sulfate 325 (65 FE) MG EC tablet Take by mouth.   gabapentin 100 MG capsule Commonly known as: NEURONTIN TAKE 1 CAPSULE BY MOUTH EVERY DAY AT BEDTIME What changed: See the new instructions.   HYDROcodone-acetaminophen 5-325 MG tablet Commonly known as: NORCO/VICODIN Take 1 tablet by mouth every 8 (eight) hours as needed for moderate pain.   loperamide 2 MG tablet Commonly known as: IMODIUM A-D Take 2 mg by mouth in the morning and at bedtime.   magnesium oxide 400 (240 Mg) MG tablet Commonly known as: MAG-OX Take 1 tablet (400 mg total) by mouth 2 (two) times daily.   mirtazapine 30 MG tablet Commonly known as: REMERON TAKE 1 TABLET BY MOUTH AT BEDTIME   multivitamin with minerals Tabs tablet Take 1 tablet by mouth daily.   ondansetron 4 MG disintegrating tablet Commonly known as: ZOFRAN-ODT TAKE 1 TABLET BY MOUTH EVERY 4 HOURS AS NEEDED FOR NAUSEA AND VOMITING What changed:  how much to take how to take  this when to take this reasons to take this additional instructions   pantoprazole 40 MG tablet Commonly known as: PROTONIX Take 1 tablet (40 mg total) by mouth 2 (two) times daily before a meal.   Prevalite 4 g packet Generic drug: cholestyramine light Take 1 packet (4 g total) by mouth daily. DO NOT TAKE WITHIN 2 HOURS OF OTHER MEDICATIONS   Probiotic 1-250 BILLION-MG Caps Take 1 capsule by mouth daily.   prochlorperazine 10 MG tablet Commonly known as: COMPAZINE Take 10 mg by mouth every 6 (six) hours as needed for vomiting or nausea.   sucralfate 1 GM/10ML suspension Commonly known as: CARAFATE TAKE 10 MLS (1 G TOTAL) BY MOUTH 4 (FOUR) TIMES DAILY AS NEEDED. FOR BREAKTHROUGH HEARTBURN What changed:  reasons to take this additional instructions   traMADol 50 MG tablet Commonly known as: ULTRAM Take 1 tablet by mouth every 6 (six) hours as needed for moderate pain or severe pain.   Vitamin D 50 MCG (2000 UT) Caps Take 2,000 Units by mouth daily.   Vitamin D3 10 MCG (400 UNIT) tablet Take 400 Units by mouth daily.        Discharge Exam: Filed Weights   01/08/22 1258 01/09/22 1220  Weight: 52.2 kg 49.8 kg   HEENT:  [place]/AT, No thrush, no icterus CV:  RRR, no rub, no S3, no S4 Lung:  CTA, no wheeze, no rhonchi Abd:  soft/+BS, NT Ext:  No edema, no lymphangitis, no synovitis, no rash  Condition at discharge: stable  The results of significant diagnostics from this hospitalization (including imaging, microbiology, ancillary and laboratory) are listed below for reference.   Imaging Studies: DG Chest Port 1 View  Result Date: 01/08/2022 CLINICAL DATA:  Shortness of breath EXAM: PORTABLE CHEST 1 VIEW COMPARISON:  Previous studies including the CT done on 05/18/2021 and chest radiograph done on 04/18/2020, PET-CT done on the no 10/01/2021 FINDINGS: Cardiac size is within normal limits. There is decreased volume in the left lung. Surgical staples are seen in left  mid and left lower lung fields. Linear densities in left lower lung fields suggest scarring. There are no signs of pulmonary edema or focal pulmonary consolidation. There is no significant pleural effusion or pneumothorax. Tip of left subclavian chest port is seen in the region of superior vena cava. IMPRESSION: Previous partial resection of left lung. There are no signs of pulmonary edema or focal pulmonary consolidation. Electronically Signed   By: Elmer Picker M.D.   On: 01/08/2022 14:06   MR Abdomen W Wo Contrast  Result Date: 01/06/2022 CLINICAL DATA:  Assess for progression of pancreatic neoplasm in a 71 year old male. EXAM: MRI ABDOMEN WITHOUT AND WITH CONTRAST TECHNIQUE: Multiplanar multisequence MR imaging of the abdomen was performed both before and after the administration of intravenous contrast. CONTRAST:  38mL GADAVIST GADOBUTROL 1 MMOL/ML IV SOLN COMPARISON:  October 01, 2021 PET evaluation and previous abdominal and pelvic CT of June of 2021. FINDINGS: Lower chest: Incidental imaging of the lung bases is unremarkable to the extent evaluated on this abdominal MRI. Hepatobiliary: Marked hepatic iron deposition with low signal throughout the liver. Signs of biliary dilation which has been present since June of 2021. Biliary enteric anatomy is of uncertain tight in this patient with history of Billroth 1. Dilated bowel loops in the upper abdomen leading 2 susceptibility artifact which limits assessment in addition to motion further limiting assessment of the biliary tree which is quite similar grossly in terms of degree of distension compared to more remote priors. There are no suspicious hepatic lesions within the limitations of the current study, limited by iron deposition in the liver and susceptibility artifact of adjacent distended bowel loops which are chronically distended. Pancreas: Pancreas is difficult to evaluate given presence of marked susceptibility artifact in the area of the upper  abdomen due to distended bowel loops. Area of restricted diffusion in the distal pancreas measuring 2.0 x 1.9 cm previously approximately 1.8 x 1.9 cm in this area. Area with larger masslike appearance of the proximal body and tail junction has resolved. Spleen: Iron deposition in the spleen with signs of splenomegaly. Splenic vein may be occluded. Short gastrics are prominent. Adrenals/Urinary Tract: Marked iron deposition in the adrenal glands. Renal cortical scarring bilaterally with simple cyst arising from anterior RIGHT kidney for which no additional follow-up imaging is recommended. This measures 5.8 x 4.2 cm. No hydronephrosis or perinephric stranding. Stomach/Bowel: Chronically distended bowel loops throughout the abdomen. Signs of prior gastroenteric anastomosis and partial gastrectomy reportedly related to Billroth 1. Biliary-enteric anatomy is of uncertain type and is stated above the biliary tree remains chronically dilated. Vascular/Lymphatic: Narrowing and or occlusion of the splenic vein due to mass in the splenic hilum. Other:  No ascites. Musculoskeletal: Iron deposition in marrow spaces throughout the visualized spine and pelvis. IMPRESSION: 1. Area of restricted diffusion in the distal pancreas measuring 2.0 x 1.9 cm previously approximately 1.8 x 1.9 cm in this area, also with enhancing tissue remaining in this area.  Resolution of the more masslike area in the lesser sac and distal pancreas seen on previous imaging. Is unclear whether this additional area represented changes related to pancreatitis or neoplasm. There is still a residual mass at the splenic hilum on today's study that may be slightly smaller. Overall there is improvement in the appearance of the pancreas. Suggest close attention on subsequent imaging. 2. Biliary enteric anatomy is of uncertain type and is stated above the biliary tree remains chronically dilated compared to more remote priors. Correlate with any clinical or  laboratory evidence of hepatic dysfunction or biliary obstruction as the biliary tree is not well evaluated but grossly similar to prior imaging. 3. Hepatic, splenic, marrow space and adrenal iron deposition could be related to severe iron overload in the setting of exogenous iron administration but this pattern is uncommon with exogenous iron administration given the severity of adrenal iron deposition and should be correlated with any history of primary hemochromatosis. 4. Splenic vein may be occluded. 5. Overall the study is compromised by bowel distension, artifact and respiratory motion as outlined above. Electronically Signed   By: Zetta Bills M.D.   On: 01/06/2022 19:12   MR 3D Recon At Scanner  Result Date: 01/06/2022 CLINICAL DATA:  Assess for progression of pancreatic neoplasm in a 71 year old male. EXAM: MRI ABDOMEN WITHOUT AND WITH CONTRAST TECHNIQUE: Multiplanar multisequence MR imaging of the abdomen was performed both before and after the administration of intravenous contrast. CONTRAST:  6mL GADAVIST GADOBUTROL 1 MMOL/ML IV SOLN COMPARISON:  October 01, 2021 PET evaluation and previous abdominal and pelvic CT of June of 2021. FINDINGS: Lower chest: Incidental imaging of the lung bases is unremarkable to the extent evaluated on this abdominal MRI. Hepatobiliary: Marked hepatic iron deposition with low signal throughout the liver. Signs of biliary dilation which has been present since June of 2021. Biliary enteric anatomy is of uncertain tight in this patient with history of Billroth 1. Dilated bowel loops in the upper abdomen leading 2 susceptibility artifact which limits assessment in addition to motion further limiting assessment of the biliary tree which is quite similar grossly in terms of degree of distension compared to more remote priors. There are no suspicious hepatic lesions within the limitations of the current study, limited by iron deposition in the liver and susceptibility artifact  of adjacent distended bowel loops which are chronically distended. Pancreas: Pancreas is difficult to evaluate given presence of marked susceptibility artifact in the area of the upper abdomen due to distended bowel loops. Area of restricted diffusion in the distal pancreas measuring 2.0 x 1.9 cm previously approximately 1.8 x 1.9 cm in this area. Area with larger masslike appearance of the proximal body and tail junction has resolved. Spleen: Iron deposition in the spleen with signs of splenomegaly. Splenic vein may be occluded. Short gastrics are prominent. Adrenals/Urinary Tract: Marked iron deposition in the adrenal glands. Renal cortical scarring bilaterally with simple cyst arising from anterior RIGHT kidney for which no additional follow-up imaging is recommended. This measures 5.8 x 4.2 cm. No hydronephrosis or perinephric stranding. Stomach/Bowel: Chronically distended bowel loops throughout the abdomen. Signs of prior gastroenteric anastomosis and partial gastrectomy reportedly related to Billroth 1. Biliary-enteric anatomy is of uncertain type and is stated above the biliary tree remains chronically dilated. Vascular/Lymphatic: Narrowing and or occlusion of the splenic vein due to mass in the splenic hilum. Other:  No ascites. Musculoskeletal: Iron deposition in marrow spaces throughout the visualized spine and pelvis. IMPRESSION: 1. Area of restricted  diffusion in the distal pancreas measuring 2.0 x 1.9 cm previously approximately 1.8 x 1.9 cm in this area, also with enhancing tissue remaining in this area. Resolution of the more masslike area in the lesser sac and distal pancreas seen on previous imaging. Is unclear whether this additional area represented changes related to pancreatitis or neoplasm. There is still a residual mass at the splenic hilum on today's study that may be slightly smaller. Overall there is improvement in the appearance of the pancreas. Suggest close attention on subsequent  imaging. 2. Biliary enteric anatomy is of uncertain type and is stated above the biliary tree remains chronically dilated compared to more remote priors. Correlate with any clinical or laboratory evidence of hepatic dysfunction or biliary obstruction as the biliary tree is not well evaluated but grossly similar to prior imaging. 3. Hepatic, splenic, marrow space and adrenal iron deposition could be related to severe iron overload in the setting of exogenous iron administration but this pattern is uncommon with exogenous iron administration given the severity of adrenal iron deposition and should be correlated with any history of primary hemochromatosis. 4. Splenic vein may be occluded. 5. Overall the study is compromised by bowel distension, artifact and respiratory motion as outlined above. Electronically Signed   By: Zetta Bills M.D.   On: 01/06/2022 19:12    Microbiology: Results for orders placed or performed during the hospital encounter of 01/08/22  C Difficile Quick Screen w PCR reflex     Status: Abnormal   Collection Time: 01/09/22 10:44 AM   Specimen: STOOL  Result Value Ref Range Status   C Diff antigen POSITIVE (A) NEGATIVE Final   C Diff toxin NEGATIVE NEGATIVE Final   C Diff interpretation Results are indeterminate. See PCR results.  Final    Comment: Performed at [facility], [address] [place], [place] [address]  C. Diff by PCR, Reflexed     Status: None   Collection Time: 01/09/22 10:44 AM  Result Value Ref Range Status   Toxigenic C. Difficile by PCR NEGATIVE NEGATIVE Final    Comment: Patient is colonized with non toxigenic C. difficile. May not need treatment unless significant symptoms are present. Performed at [facility], [place] [address] [place],  [address]     Labs: CBC: Recent Labs  Lab 01/08/22 1347 01/09/22 0602 01/10/22 0447  WBC 7.3 5.6 6.3  NEUTROABS 6.0  --   --   HGB 7.2* 8.9* 9.4*  HCT 22.3* 27.2* 28.5*  MCV 99.1 93.5 93.8  PLT  87* 72* 86*   Basic Metabolic Panel: Recent Labs  Lab 01/08/22 1347 01/10/22 0447  NA 134* 135  K 4.4 3.9  CL 108 109  CO2 19* 20*  GLUCOSE 100* 80  BUN 12 10  CREATININE 1.36* 1.25*  CALCIUM 7.7* 7.8*  MG 1.7 1.9   Liver Function Tests: Recent Labs  Lab 01/08/22 1347  AST 20  ALT 11  ALKPHOS 132*  BILITOT 0.3  PROT 5.1*  ALBUMIN 2.5*   CBG: No results for input(s): "GLUCAP" in the last 168 hours.  Discharge time spent: greater than 30 minutes.  Signed: Orson Eva, MD [facility] 01/10/2022

## 2022-01-10 NOTE — Telephone Encounter (Signed)
Please arrange hospital follow-up visit for this patient in 4 to 6 weeks with [name] or Dr. [name].  Thank you

## 2022-01-10 NOTE — Op Note (Signed)
[facility] Patient Name: [name] Procedure Date: 01/10/2022 8:32 AM MRN: [id number] Date of Birth: 08-24-1950 Attending MD: Elon Alas. Edgar Frisk CSN: [id number] Age: 71 Admit Type: Inpatient Procedure:                Flexible Sigmoidoscopy Indications:              Iron deficiency anemia, diarrhea Providers:                Elon Alas. Abbey Chatters, DO, Lurline Del, RN, Wynonia Musty Tech, Technician Referring MD:              Medicines:                See the Anesthesia note for documentation of the                            administered medications Complications:            No immediate complications. Estimated Blood Loss:     Estimated blood loss was minimal. Procedure:                Pre-Anesthesia Assessment:                           - The anesthesia plan was to use monitored                            anesthesia care (MAC).                           After obtaining informed consent, the scope was                            passed under direct vision. The [id number]-H190 ([id number])                            scope was introduced through the anus and advanced                            to the the ileo-rectal anastomosis. The flexible                            sigmoidoscopy was accomplished without difficulty.                            The patient tolerated the procedure well. The                            quality of the bowel preparation was poor. Scope In: 9:20:39 AM Scope Out: 9:29:55 AM Total Procedure Duration: 0 hours 9 minutes 16 seconds  Findings:      There was evidence of a prior side-to-side ileo-colonic anastomosis in       the recto-sigmoid colon approx 15-20cm from anal verge. This was patent       and was characterized by healthy appearing mucosa. Colon  appeared       healthy. The anastomosis was traversed.      Patchy moderate inflammation characterized by erosions, erythema and       shallow ulcerations was found in the neo  terminal ileum extending 15-20       cm proximal to the anastamosis. Biopsies were taken with a cold forceps       for histology. Impression:               - Preparation of the colon was poor.                           - Patent side-to-side ileo-colonic anastomosis,                            characterized by healthy appearing mucosa.                           - Diffuse moderate inflammation was found at the                            colonic anastomosis. Biopsied. Moderate Sedation:      Per Anesthesia Care Recommendation:           - Return patient to hospital ward for ongoing care.                           - Low fiber, low residue diet. Avoid Dairy and                            Caffeine                           - Findings consistent with enteritis. Await                            pathology results. C diff negatrive, GI panel                            pending. ?Chemotherapy induced enteritis. Continue                            supportive care. Immodium as needed, can switch to                            Loperamide if not improved. Abx pending GI panel.                           - Resume Eliquis tomorrow.                           - Consider repeat Flex sig outpatient for                            surveillance purposes as prep was not ideal today.                           -  Follow up with GI in 4-6 weeks. Procedure Code(s):        --- Professional ---                           [phone], Sigmoidoscopy, flexible; with biopsy, single                            or multiple Diagnosis Code(s):        --- Professional ---                           Z98.0, Intestinal bypass and anastomosis status                           K52.9, Noninfective gastroenteritis and colitis,                            unspecified CPT copyright 2019 American Medical Association. All rights reserved. The codes documented in this report are preliminary and upon coder review may  be revised to meet current  compliance requirements. Elon Alas. Abbey Chatters, DO [place] Abbey Chatters, DO 01/10/2022 9:57:34 AM This report has been signed electronically. Number of Addenda: 0

## 2022-01-10 NOTE — Transfer of Care (Signed)
Immediate Anesthesia Transfer of Care Note  Patient: [name]  Procedure(s) Performed: ESOPHAGOGASTRODUODENOSCOPY (EGD) WITH PROPOFOL FLEXIBLE SIGMOIDOSCOPY BIOPSY  Patient Location: PACU  Anesthesia Type:General  Level of Consciousness: awake and alert   Airway & Oxygen Therapy: Patient Spontanous Breathing  Post-op Assessment: Report given to RN and Post -op Vital signs reviewed and stable  Post vital signs: Reviewed and stable  Last Vitals:  Vitals Value Taken Time  BP 89/55   Temp 97.7   Pulse 78   Resp 16   SpO2 100     Last Pain:  Vitals:   01/10/22 0900  TempSrc: Oral  PainSc:          Complications: No notable events documented.

## 2022-01-10 NOTE — Op Note (Signed)
[facility] Patient Name: [name] Procedure Date: 01/10/2022 8:33 AM MRN: [id number] Date of Birth: 1950/05/25 Attending MD: Elon Alas. Abbey Chatters DO CSN: [id number] Age: 71 Admit Type: Inpatient Procedure:                Upper GI endoscopy Indications:              Iron deficiency anemia due to suspected upper                            gastrointestinal bleeding Providers:                Elon Alas. Abbey Chatters, DO, Lurline Del, RN, Wynonia Musty Tech, Technician Referring MD:              Medicines:                See the Anesthesia note for documentation of the                            administered medications Complications:            No immediate complications. Estimated Blood Loss:     Estimated blood loss was minimal. Procedure:                Pre-Anesthesia Assessment:                           - The anesthesia plan was to use monitored                            anesthesia care (MAC).                           After obtaining informed consent, the endoscope was                            passed under direct vision. Throughout the                            procedure, the patient's blood pressure, pulse, and                            oxygen saturations were monitored continuously. The                            [id number]-[id number] ([id number]) scope was introduced through the                            mouth, and advanced to the second part of duodenum.                            The upper GI endoscopy was accomplished without                            difficulty. The patient tolerated  the procedure                            well. Scope In: 9:15:30 AM Scope Out: 9:19:44 AM Total Procedure Duration: 0 hours 4 minutes 14 seconds  Findings:      LA Grade A (one or more mucosal breaks less than 5 mm, not extending       between tops of 2 mucosal folds) esophagitis with no bleeding was found       in the distal esophagus.      Evidence of a patent  ?Billroth I gastroduodenostomy was found. A gastric       pouch was found. The gastroduodenal anastomosis was characterized by       edema. This was traversed. Biopsies were taken with a cold forceps for       histology.      The second portion of the duodenum and third portion of the duodenum       were normal. Impression:               - LA Grade A reflux esophagitis with no bleeding.                           - Patent Billroth I gastroduodenostomy was found,                            characterized by edema. Biopsied.                           - Normal second portion of the duodenum and third                            portion of the duodenum. Moderate Sedation:      Per Anesthesia Care Recommendation:           - Return patient to hospital ward for ongoing care.                           - Resume regular diet.                           - Use Protonix (pantoprazole) 40 mg PO BID.                           - No ibuprofen, naproxen, or other non-steroidal                            anti-inflammatory drugs.                           - Follow up with GI in 4-6 weeks Procedure Code(s):        --- Professional ---                           [phone], Esophagogastroduodenoscopy, flexible,                            transoral; with biopsy, single or multiple Diagnosis Code(s):        ---  Professional ---                           K21.00, Gastro-esophageal reflux disease with                            esophagitis, without bleeding                           Z98.0, Intestinal bypass and anastomosis status                           D50.9, Iron deficiency anemia, unspecified CPT copyright 2019 American Medical Association. All rights reserved. The codes documented in this report are preliminary and upon coder review may  be revised to meet current compliance requirements. Elon Alas. Abbey Chatters, DO Metcalf Kemp Gomes, DO 01/10/2022 9:42:01 AM This report has been signed electronically. Number of  Addenda: 0

## 2022-01-10 NOTE — Anesthesia Postprocedure Evaluation (Signed)
Anesthesia Post Note  Patient: DIMETRIUS MONTFORT  Procedure(s) Performed: ESOPHAGOGASTRODUODENOSCOPY (EGD) WITH PROPOFOL FLEXIBLE SIGMOIDOSCOPY BIOPSY  Patient location during evaluation: PACU Anesthesia Type: General Level of consciousness: awake and alert Pain management: pain level controlled Vital Signs Assessment: post-procedure vital signs reviewed and stable Respiratory status: spontaneous breathing, nonlabored ventilation, respiratory function stable and patient connected to nasal cannula oxygen Cardiovascular status: blood pressure returned to baseline and stable Postop Assessment: no apparent nausea or vomiting Anesthetic complications: no   No notable events documented.   Last Vitals:  Vitals:   01/10/22 0900 01/10/22 0933  BP: 103/68   Pulse: 77   Resp: 16   Temp: 36.8 C 36.5 C  SpO2: 98%     Last Pain:  Vitals:   01/10/22 0900  TempSrc: Oral  PainSc:                  Louann Sjogren

## 2022-01-11 LAB — GASTROINTESTINAL PANEL BY PCR, STOOL (REPLACES STOOL CULTURE)

## 2022-01-12 ENCOUNTER — Ambulatory Visit: Payer: Medicare Other

## 2022-01-12 ENCOUNTER — Ambulatory Visit: Payer: Medicare Other | Admitting: Hematology

## 2022-01-12 ENCOUNTER — Other Ambulatory Visit: Payer: Self-pay

## 2022-01-12 ENCOUNTER — Other Ambulatory Visit: Payer: Medicare Other

## 2022-01-12 LAB — SURGICAL PATHOLOGY

## 2022-01-13 ENCOUNTER — Ambulatory Visit (HOSPITAL_COMMUNITY): Payer: Medicare Other | Admitting: Hematology

## 2022-01-14 ENCOUNTER — Ambulatory Visit (HOSPITAL_COMMUNITY)
Admission: RE | Admit: 2022-01-14 | Discharge: 2022-01-14 | Disposition: A | Discharge status: Home / Self Care | Payer: Medicare Other | Source: Ambulatory Visit | Attending: Hematology | Admitting: Hematology

## 2022-01-14 DIAGNOSIS — J439 Emphysema, unspecified: Secondary | ICD-10-CM | POA: Diagnosis not present

## 2022-01-14 DIAGNOSIS — I700 Atherosclerosis of aorta: Secondary | ICD-10-CM | POA: Insufficient documentation

## 2022-01-14 DIAGNOSIS — C252 Malignant neoplasm of tail of pancreas: Secondary | ICD-10-CM | POA: Insufficient documentation

## 2022-01-14 DIAGNOSIS — C259 Malignant neoplasm of pancreas, unspecified: Secondary | ICD-10-CM | POA: Diagnosis present

## 2022-01-14 MED ORDER — FLUDEOXYGLUCOSE F - 18 (FDG) INJECTION
5.9300 | Freq: Once | INTRAVENOUS | Status: AC | PRN
  Administered 2022-01-14: 5.93 via INTRAVENOUS

## 2022-01-15 ENCOUNTER — Encounter (HOSPITAL_COMMUNITY): Payer: Self-pay | Admitting: Internal Medicine

## 2022-01-20 ENCOUNTER — Inpatient Hospital Stay: Payer: Medicare Other

## 2022-01-20 ENCOUNTER — Inpatient Hospital Stay (HOSPITAL_BASED_OUTPATIENT_CLINIC_OR_DEPARTMENT_OTHER): Payer: Medicare Other | Admitting: Hematology

## 2022-01-20 ENCOUNTER — Inpatient Hospital Stay: Payer: Medicare Other | Attending: Hematology

## 2022-01-20 DIAGNOSIS — Z809 Family history of malignant neoplasm, unspecified: Secondary | ICD-10-CM | POA: Diagnosis not present

## 2022-01-20 DIAGNOSIS — Z85118 Personal history of other malignant neoplasm of bronchus and lung: Secondary | ICD-10-CM | POA: Diagnosis not present

## 2022-01-20 DIAGNOSIS — Z86718 Personal history of other venous thrombosis and embolism: Secondary | ICD-10-CM | POA: Diagnosis not present

## 2022-01-20 DIAGNOSIS — Z87891 Personal history of nicotine dependence: Secondary | ICD-10-CM | POA: Diagnosis not present

## 2022-01-20 DIAGNOSIS — C252 Malignant neoplasm of tail of pancreas: Secondary | ICD-10-CM | POA: Insufficient documentation

## 2022-01-20 DIAGNOSIS — Z85038 Personal history of other malignant neoplasm of large intestine: Secondary | ICD-10-CM | POA: Diagnosis not present

## 2022-01-20 DIAGNOSIS — Z7901 Long term (current) use of anticoagulants: Secondary | ICD-10-CM | POA: Insufficient documentation

## 2022-01-20 DIAGNOSIS — Z9049 Acquired absence of other specified parts of digestive tract: Secondary | ICD-10-CM | POA: Insufficient documentation

## 2022-01-20 DIAGNOSIS — Z79899 Other long term (current) drug therapy: Secondary | ICD-10-CM | POA: Insufficient documentation

## 2022-01-20 DIAGNOSIS — C189 Malignant neoplasm of colon, unspecified: Secondary | ICD-10-CM

## 2022-01-20 DIAGNOSIS — C259 Malignant neoplasm of pancreas, unspecified: Secondary | ICD-10-CM

## 2022-01-20 LAB — COMPREHENSIVE METABOLIC PANEL
ALT: 16 U/L (ref 0–44)
AST: 21 U/L (ref 15–41)
Albumin: 2 g/dL — ABNORMAL LOW (ref 3.5–5.0)
Alkaline Phosphatase: 129 U/L — ABNORMAL HIGH (ref 38–126)
Anion gap: 6 (ref 5–15)
BUN: 11 mg/dL (ref 8–23)
CO2: 17 mmol/L — ABNORMAL LOW (ref 22–32)
Calcium: 7 mg/dL — ABNORMAL LOW (ref 8.9–10.3)
Chloride: 111 mmol/L (ref 98–111)
Creatinine, Ser: 1.52 mg/dL — ABNORMAL HIGH (ref 0.61–1.24)
GFR, Estimated: 49 mL/min — ABNORMAL LOW (ref >60)
Glucose, Bld: 79 mg/dL (ref 70–99)
Potassium: 4.3 mmol/L (ref 3.5–5.1)
Sodium: 134 mmol/L — ABNORMAL LOW (ref 135–145)
Total Bilirubin: 0.3 mg/dL (ref 0.3–1.2)
Total Protein: 4.2 g/dL — ABNORMAL LOW (ref 6.5–8.1)

## 2022-01-20 LAB — CBC WITH DIFFERENTIAL/PLATELET
Abs Immature Granulocytes: 0.57 10*3/uL — ABNORMAL HIGH (ref 0.00–0.07)
Basophils Absolute: 0.1 10*3/uL (ref 0.0–0.1)
Basophils Relative: 0 %
Eosinophils Absolute: 0 10*3/uL (ref 0.0–0.5)
Eosinophils Relative: 0 %
HCT: 29.9 % — ABNORMAL LOW (ref 39.0–52.0)
Hemoglobin: 9.9 g/dL — ABNORMAL LOW (ref 13.0–17.0)
Immature Granulocytes: 3 %
Lymphocytes Relative: 10 %
Lymphs Abs: 2 10*3/uL (ref 0.7–4.0)
MCH: 31.2 pg (ref 26.0–34.0)
MCHC: 33.1 g/dL (ref 30.0–36.0)
MCV: 94.3 fL (ref 80.0–100.0)
Monocytes Absolute: 1.6 10*3/uL — ABNORMAL HIGH (ref 0.1–1.0)
Monocytes Relative: 8 %
Neutro Abs: 15.9 10*3/uL — ABNORMAL HIGH (ref 1.7–7.7)
Neutrophils Relative %: 79 %
Platelets: 154 10*3/uL (ref 150–400)
RBC: 3.17 MIL/uL — ABNORMAL LOW (ref 4.22–5.81)
RDW: 18.4 % — ABNORMAL HIGH (ref 11.5–15.5)
WBC: 20.1 10*3/uL — ABNORMAL HIGH (ref 4.0–10.5)
nRBC: 0 % (ref 0.0–0.2)

## 2022-01-20 LAB — MAGNESIUM: Magnesium: 1.6 mg/dL — ABNORMAL LOW (ref 1.7–2.4)

## 2022-01-20 MED ORDER — SODIUM CHLORIDE 0.9% FLUSH
10.0000 mL | INTRAVENOUS | Status: AC
  Administered 2022-01-20: 10 mL

## 2022-01-20 MED ORDER — HEPARIN SOD (PORK) LOCK FLUSH 100 UNIT/ML IV SOLN
500.0000 [IU] | Freq: Once | INTRAVENOUS | Status: AC
  Administered 2022-01-20: 500 [IU] via INTRAVENOUS

## 2022-01-20 NOTE — Progress Notes (Signed)
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   CURRENT THERAPY: FOLFIRINOX every 2 weeks  BRIEF ONCOLOGIC HISTORY:  Oncology History  Pancreatic cancer ([place])  10/07/2021 Initial Diagnosis   Pancreatic cancer ([place])   10/13/2021 - 11/19/2021 Chemotherapy   Patient is on Treatment Plan : PANCREAS Modified FOLFIRINOX q14d x 4 cycles     10/13/2021 -  Chemotherapy   Patient is on Treatment Plan : PANCREAS Modified FOLFIRINOX q14d x 4 cycles       CANCER STAGING:  Cancer Staging  Adenocarcinoma of colon with mucinous features Staging form: Colon and Rectum, AJCC 7th Edition - Clinical: Stage IIIB (T3, N1, M0) - Signed by Baird Cancer, PA on 09/22/2010  Pancreatic cancer [facility]) Staging form: Exocrine Pancreas, AJCC 8th Edition - Clinical stage from 10/07/2021: Stage IIB (cT1, cN1, cM0) - Unsigned    INTERVAL HISTORY:  Mr. Lazarz 71 y.o. male seen for follow-up after cycle 6 cycles of FOLFIRINOX.  He reports energy levels are 50%.  Numbness in the feet has been stable.  He had to go to the ER around 10 days ago with shortness of breath.  He was found to have a hemoglobin 7.2 and received 2 units PRBC.  Shortness of breath improved.  He lost about 1.6 pounds from last visit.  Baseline diarrhea is stable and controlled with Imodium and Lomotil.   REVIEW OF SYSTEMS:  Review of Systems  Gastrointestinal:  Positive for diarrhea and nausea.  Neurological:  Positive for numbness (feet).  Psychiatric/Behavioral:  Positive for sleep disturbance.   All other systems reviewed and are negative.    PAST MEDICAL/SURGICAL HISTORY:  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   Stage 3   Anemia    Anxiety     Arthritis    Barrett's esophagus    Blood transfusion    Bowel obstruction ([place]) 05/13/2012   Recurrent   Bronchitis    Chest pain at rest    Chronic abdominal pain    Erosive esophagitis    ETOH abuse    quit 03/2010   GERD (gastroesophageal reflux disease)    Hx of Clostridium difficile infection 01/2012   Hypertension    Ileus ([place])    Iron deficiency anemia 03/23/2016   Lung cancer ([place])    Obstruction of bowel ([place]) 03/03/2014   Osteoporosis    Pancreatic cancer ([facility])    Personal history of PE (pulmonary embolism) 10/01/2010   Pneumonia    Pulmonary embolism ([place]) 02/2010   Recurrent upper respiratory infection (URI)    Renal disorder    S/P endoscopy 09/28/2010   erosive reflux esophagitis, Billroth I anatomy   S/P partial gastrectomy 1980s   Seizures ([place])    was on meds for 6 months, Unknown etiology, last seizure was 2017.   Shortness of breath    TIA (transient ischemic attack) 12/2009   Vitamin B12 deficiency    Past Surgical History:  Procedure Laterality Date   ABDOMINAL ADHESION SURGERY  03/04/15   @ [facility]   ABDOMINAL EXPLORATION SURGERY     abdominal sugery     for bowel obstruction x 8, all in 1980s, except for one in 07/2010   APPENDECTOMY  1980s  Billroth 1 hemigastrectomy  1980s   per patient for benign duodenal tumor   BIOPSY  01/10/2022   Procedure: BIOPSY;  Surgeon: Eloise Harman, DO;  [facility];  Service: Endoscopy;;  gastric,ileum   CARDIAC CATHETERIZATION  07/17/2012   CHOLECYSTECTOMY  1980s   COLON SURGERY  May 2012   left hemicolectomy, colon cancer found at time of surgery for bowel obstruction   COLON SURGERY  10/12/2018   At [facility]: Resection of right colon and terminal ileum with creation of ileorectal anastomosis for large bowel obstruction.  Cecum and ascending colon noted to be attached to the rectum.   COLONOSCOPY  03/18/2011   anastomosis at 35cm. Several adenomatous polyps removed. Sigmoid diverticulosis. Next  TCS 02/2013   COLONOSCOPY N/A 07/24/2012   PJA:SNKNLZ post segmental resection with normal-appearing colonic anastomosis aside from an adjacent polyp-removed as described above. Rectal polyp-removed as described above. CT findings appear to have been artifactual. tubular adenomas/prolapsed type polyp.   COLONOSCOPY N/A 05/15/2015   Procedure: COLONOSCOPY;  Surgeon: [name], MD;  [facility];  Service: Endoscopy;  Laterality: N/A;   COLONOSCOPY  09/26/2018   at [facility]: prep not adequate for colon cancer surveillance.  Prior end-to-end colonic anastomosis in the rectosigmoid region.  This was patent and characterized by healthy-appearing mucosa.  Anastomosis was traversed.  Normal terminal ileum.   COLONOSCOPY WITH PROPOFOL N/A 11/04/2016   Dr. [name], status post subtotal colectomy with normal-appearing residual lower GI tract.  Next colonoscopy in 5 years.   ESOPHAGOGASTRODUODENOSCOPY  09/28/2010   ESOPHAGOGASTRODUODENOSCOPY  12/01/2010   Cervical web status post dilation, erosive esophagitis, B1 hemigastrectomy, inflamed anastomosis   ESOPHAGOGASTRODUODENOSCOPY  04/16/2011   excoriation at [facility] c/w trauma/M-W tear, friable gastric anastomosis, dilation efferent limb   ESOPHAGOGASTRODUODENOSCOPY N/A 06/03/2014   Dr.[name]- cervcal esopphageal web s/p dilation. abnormal distal esophagus bx= barretts esophagus   ESOPHAGOGASTRODUODENOSCOPY N/A 05/15/2015   Procedure: ESOPHAGOGASTRODUODENOSCOPY (EGD);  Surgeon: [name], MD;  [facility];  Service: Endoscopy;  Laterality: N/A;  230   ESOPHAGOGASTRODUODENOSCOPY (EGD) WITH ESOPHAGEAL DILATION  02/25/2012   JQB:HALPFXTK esophageal web-s/p dilation anddisruption as described above. Status post prior gastric with Billroth I configuration. Abnormal gastric mucosa at the anastomosis. Gastric biopsy showed mild chronic inflammation but no H. pylori    ESOPHAGOGASTRODUODENOSCOPY (EGD) WITH PROPOFOL N/A 11/04/2016   Dr. [name]: Reflux  esophagitis, small hiatal hernia status post hemigastrectomy.  Single patent efferent small bowel limb appeared normal, 2 x 2 centimeter tongue of salmon epithelium again seen, esophageal dilation.  Biopsies consistent with reflux changes, not Barrett's however this was confirmed on prior EGDs.  Offer 3-year follow-up EGD August 2021.   ESOPHAGOGASTRODUODENOSCOPY (EGD) WITH PROPOFOL N/A 03/03/2020   Procedure: ESOPHAGOGASTRODUODENOSCOPY (EGD) WITH PROPOFOL;  Surgeon: [name], MD;  [facility];  Service: Endoscopy;  Laterality: N/A;  3:00pm   ESOPHAGOGASTRODUODENOSCOPY (EGD) WITH PROPOFOL N/A 01/10/2022   Procedure: ESOPHAGOGASTRODUODENOSCOPY (EGD) WITH PROPOFOL;  Surgeon: Eloise Harman, DO;  [facility];  Service: Endoscopy;  Laterality: N/A;   FLEXIBLE SIGMOIDOSCOPY N/A 01/10/2022   Procedure: FLEXIBLE SIGMOIDOSCOPY;  Surgeon: Eloise Harman, DO;  [facility];  Service: Endoscopy;  Laterality: N/A;   HERNIA REPAIR     right inguinal   MALONEY DILATION N/A 06/03/2014   Procedure: [name] DILATION;  Surgeon: [name], MD;  [facility];  Service: Endoscopy;  Laterality: N/A;   MALONEY DILATION N/A 05/15/2015  Procedure: MALONEY DILATION;  Surgeon: [name], MD;  [facility];  Service: Endoscopy;  Laterality: N/A;   MALONEY DILATION N/A 11/04/2016   Procedure: [name] DILATION;  Surgeon: [name], MD;  [facility];  Service: Endoscopy;  Laterality: N/A;   MALONEY DILATION N/A 03/03/2020   Procedure: [name] DILATION;  Surgeon: [name], MD;  [facility];  Service: Endoscopy;  Laterality: N/A;   PORTACATH PLACEMENT     SAVORY DILATION N/A 06/03/2014   Procedure: SAVORY DILATION;  Surgeon: [name], MD;  [facility];  Service: Endoscopy;  Laterality: N/A;     SOCIAL HISTORY:  Social History   Socioeconomic History   Marital status: Married    Spouse name:  Not on file   Number of children: 3   Years of education: Not on file   Highest education level: Not on file  Occupational History    Employer: [employer]  Tobacco Use   Smoking status: Former    Packs/day: 0.50    Years: 40.00    Total pack years: 20.00    Types: Cigarettes    Quit date: 12/20/2012    Years since quitting: 9.0   Smokeless tobacco: Never  Vaping Use   Vaping Use: Never used  Substance and Sexual Activity   Alcohol use: No   Drug use: No   Sexual activity: Never  Other Topics Concern   Not on file  Social History Narrative   Not on file   Social Determinants of Health   Financial Resource Strain: Not on file  Food Insecurity: No Food Insecurity (01/09/2022)   Hunger Vital Sign    Worried About Running Out of Food in the Last Year: Never true    Ran Out of Food in the Last Year: Never true  Transportation Needs: No Transportation Needs (01/09/2022)   PRAPARE - Hydrologist (Medical): No    Lack of Transportation (Non-Medical): No  Physical Activity: Not on file  Stress: Not on file  Social Connections: Not on file  Intimate Partner Violence: Not At Risk (01/09/2022)   Humiliation, Afraid, Rape, and Kick questionnaire    Fear of Current or Ex-Partner: No    Emotionally Abused: No    Physically Abused: No    Sexually Abused: No    FAMILY HISTORY:  Family History  Problem Relation Age of Onset   Hypertension Mother    Arthritis Mother    Pneumonia Mother    Hypertension Father    Heart attack Father    Bone cancer Cousin    Colon cancer Neg Hx     CURRENT MEDICATIONS:  Outpatient Encounter Medications as of 01/20/2022  Medication Sig   apixaban (ELIQUIS) 5 MG TABS tablet Take 1 tablet (5 mg total) by mouth 2 (two) times daily. Restart 01/11/22   atorvastatin (LIPITOR) 20 MG tablet Take 20 mg by mouth daily.   Bacillus Coagulans-Inulin (PROBIOTIC) 1-250 BILLION-MG CAPS Take 1 capsule by mouth daily.    chlorproMAZINE (THORAZINE) 25 MG tablet Take 1 tablet (25 mg total) by mouth every 6 (six) hours as needed. (Patient taking differently: Take 25 mg by mouth every 6 (six) hours as needed for nausea or vomiting.)   Cholecalciferol (VITAMIN D) 2000 units CAPS Take 2,000 Units by mouth daily.   Cholecalciferol (VITAMIN D3) 10 MCG (400 UNIT) tablet Take 400 Units by mouth daily.   CREON 36000-114000 units CPEP capsule  Take 72,000-108,000 Units by mouth See admin instructions. Take 3 capsules (108,000 units) by mouth with meals and take 2 capsules (72,000 units) with snacks.   cyanocobalamin 100 MCG tablet Take by mouth.   diphenoxylate-atropine (LOMOTIL) 2.5-0.025 MG tablet Take 1 tablet by mouth 4 times daily   ferrous sulfate 325 (65 FE) MG EC tablet Take by mouth.   gabapentin (NEURONTIN) 100 MG capsule TAKE 1 CAPSULE BY MOUTH EVERY DAY AT BEDTIME (Patient taking differently: Take 100 mg by mouth at bedtime.)   HYDROcodone-acetaminophen (NORCO/VICODIN) 5-325 MG tablet Take 1 tablet by mouth every 8 (eight) hours as needed for moderate pain.   loperamide (IMODIUM A-D) 2 MG tablet Take 2 mg by mouth in the morning and at bedtime.   magnesium oxide (MAG-OX) 400 (240 Mg) MG tablet Take 1 tablet (400 mg total) by mouth 2 (two) times daily.   mirtazapine (REMERON) 30 MG tablet TAKE 1 TABLET BY MOUTH AT BEDTIME   Multiple Vitamin (MULTIVITAMIN WITH MINERALS) TABS tablet Take 1 tablet by mouth daily.   ondansetron (ZOFRAN-ODT) 4 MG disintegrating tablet TAKE 1 TABLET BY MOUTH EVERY 4 HOURS AS NEEDED FOR NAUSEA AND VOMITING (Patient taking differently: Take 4 mg by mouth every 4 (four) hours as needed for vomiting or nausea.)   pantoprazole (PROTONIX) 40 MG tablet Take 1 tablet (40 mg total) by mouth 2 (two) times daily before a meal.   PREVALITE 4 g packet Take 1 packet (4 g total) by mouth daily. DO NOT TAKE WITHIN 2 HOURS OF OTHER MEDICATIONS   prochlorperazine (COMPAZINE) 10 MG tablet Take 10 mg by mouth  every 6 (six) hours as needed for vomiting or nausea.   sucralfate (CARAFATE) 1 GM/10ML suspension TAKE 10 MLS (1 G TOTAL) BY MOUTH 4 (FOUR) TIMES DAILY AS NEEDED. FOR BREAKTHROUGH HEARTBURN (Patient taking differently: Take 1 g by mouth 4 (four) times daily as needed (breakthrough heartburn).)   traMADol (ULTRAM) 50 MG tablet Take 1 tablet by mouth every 6 (six) hours as needed for moderate pain or severe pain.   [DISCONTINUED] lidocaine-prilocaine (EMLA) cream Apply to port site 1 hour prior to port access   Facility-Administered Encounter Medications as of 01/20/2022  Medication   acetaminophen (TYLENOL) 325 MG tablet   heparin lock flush 100 unit/mL   loratadine (CLARITIN) 10 MG tablet   sodium chloride flush (NS) 0.9 % injection 10 mL    ALLERGIES:  No Known Allergies   PHYSICAL EXAM:  ECOG Performance status: 1  There were no vitals filed for this visit. There were no vitals filed for this visit. Physical Exam Vitals reviewed.  Constitutional:      Appearance: Normal appearance.  Cardiovascular:     Rate and Rhythm: Normal rate and regular rhythm.     Heart sounds: Normal heart sounds.  Pulmonary:     Breath sounds: Normal breath sounds.  Abdominal:     Palpations: Abdomen is soft. There is no mass.  Neurological:     General: No focal deficit present.     Mental Status: He is alert and oriented to person, place, and time.  Psychiatric:        Mood and Affect: Mood normal.        Behavior: Behavior normal.      LABORATORY DATA:  I have reviewed the labs as listed.  CBC    Component Value Date/Time   WBC 6.3 01/10/2022 0447   RBC 3.04 (L) 01/10/2022 0447   HGB 9.4 (L) 01/10/2022 0086  HCT 28.5 (L) 01/10/2022 0447   PLT 86 (L) 01/10/2022 0447   MCV 93.8 01/10/2022 0447   MCH 30.9 01/10/2022 0447   MCHC 33.0 01/10/2022 0447   RDW 19.0 (H) 01/10/2022 0447   LYMPHSABS 0.3 (L) 01/08/2022 1347   MONOABS 0.7 01/08/2022 1347   EOSABS 0.0 01/08/2022 1347    BASOSABS 0.0 01/08/2022 1347      Latest Ref Rng & Units 01/10/2022    4:47 AM 01/08/2022    1:47 PM 12/30/2021    8:21 AM  CMP  Glucose 70 - 99 mg/dL 80  100  87   BUN 8 - 23 mg/dL 10  12  12    Creatinine 0.61 - 1.24 mg/dL 1.25  1.36  1.40   Sodium 135 - 145 mmol/L 135  134  135   Potassium 3.5 - 5.1 mmol/L 3.9  4.4  4.2   Chloride 98 - 111 mmol/L 109  108  109   CO2 22 - 32 mmol/L 20  19  19    Calcium 8.9 - 10.3 mg/dL 7.8  7.7  7.3   Total Protein 6.5 - 8.1 g/dL  5.1  4.8   Total Bilirubin 0.3 - 1.2 mg/dL  0.3  0.5   Alkaline Phos 38 - 126 U/L  132  138   AST 15 - 41 U/L  20  19   ALT 0 - 44 U/L  11  9     DIAGNOSTIC IMAGING:  I have independently reviewed the scans and discussed with the patient.  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  No lymphadenopathy. - EUS and FNA (09/21/2021): Oval mass in the pancreatic tail, hypoechoic, 20 mm x 17 mm.  Remainder of the pancreas shows decreased visualization of the pancreas. - Pathology: Invasive adenocarcinoma, ductal type, moderately to poorly differentiated.  Background desmoplastic stroma, blood and pancreatic tissue.  Morphologically distinct from prior lung adenocarcinoma.  TTF-1 and Napsin A were negative. - Patient evaluated by Dr. Dayton Scrape at [facility] for pancreatic resection. - CA 19-9 (10/02/2021): 6.47 - PET CT scan (10/02/2021): Ill-defined 4 cm mass in the left upper quadrant in the region of the pancreatic tail with mild hypermetabolism, SUV 4.7.  Soft tissue nodule in the gastrohepatic ligament measuring 2 x 1.3 cm, SUV 4.0.  Suspicious for metastatic lymphadenopathy.  9 mm mediastinal lymph node and AP window is hypermetabolic SUV 4.9.  Borderline enlarged left hilar lymph nodes show hypermetabolic activity SUV 3.4.  Previously seen subpleural nodule in the superior right lower lobe  has nearly completely resolved.  Overall mild hypermetabolic mediastinal and left hilar lymph nodes which are nonspecific.  May be reactive in etiology although metastatic disease cannot definitely be excluded.        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 -Last EGD/colonoscopy on 05/15/2015 was normal. - CT scan in October 2018 was negative for metastatic disease. 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  Margins negative.  2 hilar lymph nodes were negative. - No adjuvant chemotherapy was needed.  Social/family history: - Lives at home with his wife.  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  No chemical exposure.  Quit smoking in 2014 and smoked 1 pack/day for 44 years. - Paternal cousin has bone cancer.    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 - PET scan (01/14/2022): Decrease in size and FDG avidity of the pancreatic tail mass.  Resolution of masslike hypermetabolic nodularity in the gastrohepatic ligament.  Resolution of the hypermetabolic mediastinal and hilar lymph nodes. - I have reached out to Dr. Sander Nephew at [facility]  She will review PET scan and give her opinion about surgery option. - I will hold his treatment today.  We will reevaluate him in 1 week.  2.  Abdominal pain: - Left upper quadrant pain has improved since start of chemotherapy.   3. Iron deficiency: - Feraheme completed on 11/17/2021.  Hemoglobin today is 9.9.   4. 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  This was present prior to chemotherapy.  5.  Chronic diarrhea: - Diarrhea is stable.  To continue Imodium 2 tablets/day and Lomotil 4 times daily.  6.  Weight loss: - He has lost 1.6 pounds since last visit.  Closely monitor.        Orders placed this encounter:  No orders of the defined types were placed in this encounter.      Derek Jack, MD [place] [phone]

## 2022-01-20 NOTE — Patient Instructions (Addendum)
[place] at [facility] Discharge Instructions   You were seen and examined today by Dr. Delton Coombes.  He reviewed the results of your CT scan which shows good response to treatment. We are waiting to hear from your surgeon regarding how to proceed going forward. We will not give anymore treatment until we hear back from her.   He reviewed your lab results which are stable. The results of your kidney and liver functions, electrolytes, and blood sugar are pending.   We will see you back in one week to discuss the plan for treatment.    Thank you for choosing [place] at [facility] to provide your oncology and hematology care.  To afford each patient quality time with our provider, please arrive at least 15 minutes before your scheduled appointment time.   If you have a lab appointment with the [facility] please come in thru the Main Entrance and check in at the main information desk.  You need to re-schedule your appointment should you arrive 10 or more minutes late.  We strive to give you quality time with our providers, and arriving late affects you and other patients whose appointments are after yours.  Also, if you no show three or more times for appointments you may be dismissed from the clinic at the providers discretion.     Again, thank you for choosing [facility].  Our hope is that these requests will decrease the amount of time that you wait before being seen by our physicians.       _____________________________________________________________  Should you have questions after your visit to [facility], please contact our office at [phone] and follow the prompts.  Our office hours are 8:00 a.m. and 4:30 p.m. Monday - Friday.  Please note that voicemails left after 4:00 p.m. may not be returned until the following business day.  We are closed weekends and major holidays.  You do have access to a nurse  24-7, just call the main number to the clinic [phone] and do not press any options, hold on the line and a nurse will answer the phone.    For prescription refill requests, have your pharmacy contact our office and allow 72 hours.    Due to Covid, you will need to wear a mask upon entering the hospital. If you do not have a mask, a mask will be given to you at the Main Entrance upon arrival. For doctor visits, patients may have 1 support person age 37 or older with them. For treatment visits, patients can not have anyone with them due to social distancing guidelines and our immunocompromised population.

## 2022-01-20 NOTE — Progress Notes (Signed)
We will hold treatment today. We are waiting to hear back from his surgeon as to how to proceed per Dr.K.

## 2022-01-21 ENCOUNTER — Telehealth: Payer: Self-pay | Admitting: Licensed Clinical Social Worker

## 2022-01-21 ENCOUNTER — Encounter: Payer: Self-pay | Admitting: Dietician

## 2022-01-21 NOTE — Progress Notes (Signed)
Patient identified on MST (Malnutrition Screening Tool Report).  Attempted to contact patient via telephone to introduce self and services available at [facility]. Patient did not answer. Left message with request for return call. Contact information provided.

## 2022-01-22 ENCOUNTER — Other Ambulatory Visit: Payer: Self-pay | Admitting: Hematology

## 2022-01-28 ENCOUNTER — Ambulatory Visit: Payer: Medicare Other | Admitting: Hematology

## 2022-01-29 ENCOUNTER — Other Ambulatory Visit: Payer: Self-pay

## 2022-01-29 MED ORDER — HYDROCODONE-ACETAMINOPHEN 5-325 MG PO TABS: 1.0000 | ORAL_TABLET | Freq: Three times a day (TID) | ORAL | 0 refills | Status: DC | PRN

## 2022-02-02 ENCOUNTER — Other Ambulatory Visit: Payer: Self-pay

## 2022-02-02 DIAGNOSIS — C189 Malignant neoplasm of colon, unspecified: Secondary | ICD-10-CM

## 2022-02-02 DIAGNOSIS — C252 Malignant neoplasm of tail of pancreas: Secondary | ICD-10-CM

## 2022-02-03 ENCOUNTER — Other Ambulatory Visit: Payer: Self-pay

## 2022-02-04 ENCOUNTER — Other Ambulatory Visit: Payer: Self-pay | Admitting: Gastroenterology

## 2022-02-10 ENCOUNTER — Encounter: Payer: Self-pay | Admitting: Gastroenterology

## 2022-02-10 ENCOUNTER — Ambulatory Visit (INDEPENDENT_AMBULATORY_CARE_PROVIDER_SITE_OTHER): Payer: Medicare Other | Admitting: Gastroenterology

## 2022-02-10 VITALS — BP 123/79 | HR 111 | Temp 97.7°F | Ht 68.0 in | Wt 126.0 lb

## 2022-02-10 DIAGNOSIS — R197 Diarrhea, unspecified: Secondary | ICD-10-CM | POA: Diagnosis not present

## 2022-02-10 DIAGNOSIS — K219 Gastro-esophageal reflux disease without esophagitis: Secondary | ICD-10-CM

## 2022-02-10 DIAGNOSIS — D649 Anemia, unspecified: Secondary | ICD-10-CM | POA: Diagnosis not present

## 2022-02-10 NOTE — Progress Notes (Signed)
GI Office Note    Referring Provider: [name], MD Primary Care Physician:  [name], MD  Primary Gastroenterologist: Garfield Cornea, MD   Chief Complaint   Chief Complaint  Patient presents with   Hospitalization Follow-up    For low hemoglobin, states he is doing well. Still has issues with chronic diarrhea.     History of Present Illness   [name] is a 71 y.o. 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 Most recent surgery left him with ileorectal anastomosis. H/o pancreatic exocrine insufficiency. 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  Patient seen last month when he presented with symptomatic anemia.  Found to have a hemoglobin of 7.2, transfused 2 units.  At time of discharge his hemoglobin was 9.4.  Last hemoglobin from November 10 was 10.3.  Back in June 2023 his Hgb was 12.5, in August was 10.7, in September was 8.3.   Chronically on oral iron.  Was having dark stools which she felt was related to iron therapy. He has a history of chronic diarrhea felt to be multifactorial in the setting of altered GI anatomy, pancreatic insufficiency.  Prior history of C. difficile multiple years ago.  Recently felt like his diarrhea was worsened in the setting of chemotherapy. Also with abdominal pain, more recently left upper quadrant suspected to be related to his pancreatic adenocarcinoma.  EGD October 2023: - LA Grade A reflux esophagitis with no bleeding. - Patent Billroth I gastroduodenostomy was found, characterized by edema. Biopsied.  Active  gastropathy and mild chronic gastritis showing foveolar hyperplasia and focal intestinal metaplasia.  Negative for H. pylori. - Normal second portion of the duodenum and third portion of the duodenum.  Flexible sigmoidoscopy October 2023: - Preparation of the colon was poor. - Patent side-to-side ileo-colonic anastomosis, characterized by healthy appearing mucosa. - Diffuse moderate inflammation was found at the colonic anastomosis. Biopsied.  Active ileitis, negative for dysplasia and granulomas. -Consider flexible sigmoidoscopy as an outpatient for surveillance purposes due to relatively poor prep.  C. difficile quick screen October 2023: C. difficile antigen positive, C. difficile toxin negative.  Reflex toxigenic C. difficile PCR was also negative. GI Pathogen panel October 2023: Negative  Today: Patient did not get antibiotics for C. difficile.  It was felt he was colonized. No chemo since in the hospital.  While on chemo he was having 8-10 stools per day, now is having 3 to 4/day.  Stools are always loose which is baseline.  No bright red blood per rectum.  He has constant left upper quadrant pain which she feels is related to pancreatic cancer.  Appetite has been okay.  No weight loss.  No heartburn, vomiting.     Medications   Current Outpatient Medications  Medication Sig Dispense Refill   apixaban (ELIQUIS) 5 MG TABS tablet Take 1 tablet (5 mg total) by mouth 2 (two) times daily. Restart 01/11/22 60 tablet 6   atorvastatin (LIPITOR) 20 MG tablet Take 20 mg by mouth daily.     Bacillus Coagulans-Inulin (PROBIOTIC) 1-250 BILLION-MG CAPS Take 1 capsule by mouth daily.     chlorproMAZINE (THORAZINE) 25 MG tablet Take 1 tablet (  25 mg total) by mouth every 6 (six) hours as needed. (Patient taking differently: Take 25 mg by mouth every 6 (six) hours as needed for nausea or vomiting.) 30 tablet 0   Cholecalciferol (VITAMIN D) 2000 units CAPS Take 2,000 Units by mouth daily.      Cholecalciferol (VITAMIN D3) 10 MCG (400 UNIT) tablet Take 400 Units by mouth daily.     CREON 36000-114000 units CPEP capsule Take 72,000-108,000 Units by mouth See admin instructions. Take 3 capsules (108,000 units) by mouth with meals and take 2 capsules (72,000 units) with snacks.     cyanocobalamin 100 MCG tablet Take by mouth.     diclofenac Sodium (VOLTAREN) 1 % GEL Apply 4 g topically 4 (four) times daily.     dicyclomine (BENTYL) 10 MG capsule Take by mouth.     diphenoxylate-atropine (LOMOTIL) 2.5-0.025 MG tablet Take 1 tablet by mouth 4 times daily 120 tablet 1   ferrous sulfate 325 (65 FE) MG EC tablet Take by mouth.     gabapentin (NEURONTIN) 100 MG capsule TAKE 1 CAPSULE BY MOUTH EVERY DAY AT BEDTIME (Patient taking differently: Take 100 mg by mouth at bedtime.) 30 capsule 6   HYDROcodone-acetaminophen (NORCO/VICODIN) 5-325 MG tablet Take 1 tablet by mouth every 8 (eight) hours as needed for moderate pain. 45 tablet 0   magnesium oxide (MAG-OX) 400 (240 Mg) MG tablet TAKE 1 TABLET BY MOUTH TWICE A DAY 180 tablet 3   mirtazapine (REMERON) 30 MG tablet TAKE 1 TABLET BY MOUTH AT BEDTIME 30 tablet 3   Multiple Vitamin (MULTIVITAMIN WITH MINERALS) TABS tablet Take 1 tablet by mouth daily.     ondansetron (ZOFRAN-ODT) 4 MG disintegrating tablet TAKE 1 TABLET BY MOUTH EVERY 4 HOURS AS NEEDED FOR NAUSEA AND VOMITING (Patient taking differently: Take 4 mg by mouth every 4 (four) hours as needed for vomiting or nausea.) 30 tablet 1   pantoprazole (PROTONIX) 40 MG tablet Take 1 tablet (40 mg total) by mouth 2 (two) times daily before a meal. 180 tablet 3   PREVALITE 4 g packet Take 1 packet (4 g total) by mouth daily. DO NOT TAKE WITHIN 2 HOURS OF OTHER MEDICATIONS 90 packet 3   prochlorperazine (COMPAZINE) 10 MG tablet Take 10 mg by mouth every 6 (six) hours as needed for vomiting or nausea.     sucralfate (CARAFATE) 1 GM/10ML suspension TAKE 10 MLS (1 G TOTAL) BY MOUTH 4 (FOUR) TIMES DAILY AS  NEEDED. FOR BREAKTHROUGH HEARTBURN (Patient taking differently: Take 1 g by mouth 4 (four) times daily as needed (breakthrough heartburn).) 420 mL 5   traMADol (ULTRAM) 50 MG tablet Take 1 tablet by mouth every 6 (six) hours as needed for moderate pain or severe pain.     No current facility-administered medications for this visit.   Facility-Administered Medications Ordered in Other Visits  Medication Dose Route Frequency Provider Last Rate Last Admin   acetaminophen (TYLENOL) 325 MG tablet            heparin lock flush 100 unit/mL  500 Units Intravenous Once Kefalas, Thomas S, PA-C       loratadine (CLARITIN) 10 MG tablet            sodium chloride flush (NS) 0.9 % injection 10 mL  10 mL Intravenous PRN Holley Bouche, NP   10 mL at 11/05/16 1136    Allergies   Allergies as of 02/10/2022   (No Known Allergies)      Review of  Systems   General: Negative for anorexia, weight loss, fever, chills, fatigue, weakness. ENT: Negative for hoarseness, difficulty swallowing , nasal congestion. CV: Negative for chest pain, angina, palpitations, dyspnea on exertion, peripheral edema.  Respiratory: Negative for dyspnea at rest, dyspnea on exertion, cough, sputum, wheezing.  GI: See history of present illness. GU:  Negative for dysuria, hematuria, urinary incontinence, urinary frequency, nocturnal urination.  Endo: Negative for unusual weight change.     Physical Exam   BP 123/79 (BP Location: Right Arm, Patient Position: Sitting, Cuff Size: Normal)   Pulse (!) 111   Temp 97.7 F (36.5 C) (Temporal)   Ht 5\' 8"  (1.727 m)   Wt 126 lb (57.2 kg)   SpO2 98%   BMI 19.16 kg/m    General: thin male in no acute distress.  Eyes: No icterus. Mouth: Oropharyngeal mucosa moist and pink , no lesions erythema or exudate. Lungs: Clear to auscultation bilaterally.  Heart: Regular rate and rhythm, no murmurs rubs or gallops.  Abdomen: Bowel sounds are normal, nontender, nondistended, no  hepatosplenomegaly or masses, no abdominal bruits or hernia , no rebound or guarding.  Rectal: not performed Extremities: No lower extremity edema. No clubbing or deformities. Neuro: Alert and oriented x 4   Skin: Warm and dry, no jaundice.   Psych: Alert and cooperative, normal mood and affect.  Labs   Lab Results  Component Value Date   CREATININE 1.52 (H) 01/20/2022   BUN 11 01/20/2022   NA 134 (L) 01/20/2022   K 4.3 01/20/2022   CL 111 01/20/2022   CO2 17 (L) 01/20/2022   Lab Results  Component Value Date   WBC 20.1 (H) 01/20/2022   HGB 9.9 (L) 01/20/2022   HCT 29.9 (L) 01/20/2022   MCV 94.3 01/20/2022   PLT 154 01/20/2022   Lab Results  Component Value Date   ALT 16 01/20/2022   AST 21 01/20/2022   ALKPHOS 129 (H) 01/20/2022   BILITOT 0.3 01/20/2022   .lastlip Imaging Studies   NM PET Image Restag (PS) Skull Base To Thigh  Result Date: 01/18/2022 CLINICAL DATA:  Subsequent treatment strategy for pancreatic cancer. EXAM: NUCLEAR MEDICINE PET SKULL BASE TO THIGH TECHNIQUE: 5.93 mCi F-18 FDG was injected intravenously. Full-ring PET imaging was performed from the skull base to thigh after the radiotracer. CT data was obtained and used for attenuation correction and anatomic localization. Fasting blood glucose: 88 mg/dl COMPARISON:  PET-CT October 01, 2021 and MRI abdomen January 06, 2022. FINDINGS: Mediastinal blood pool activity: SUV max 1.5 Liver activity: SUV max NA NECK: No hypermetabolic cervical adenopathy. Incidental CT findings: None. CHEST: No hypermetabolic thoracic adenopathy. Interval resolution of the hypermetabolic mediastinal and hilar lymph nodes. No hypermetabolic pulmonary nodules or masses. Incidental CT findings: Calcified mediastinal and left hilar lymph nodes. Aortic atherosclerosis. Coronary artery calcifications. Motion degraded examination of the lungs reveals no suspicious pulmonary nodules or masses. ABDOMEN/PELVIS: Comparison to prior PET-CT there has  been decrease in size of the mass in the left upper quadrant of the pancreatic tail, now with a nodular appearing area in the pancreatic tail measuring 19 x 16 mm on image 139/3 with a max SUV of 1.5 previously measuring 4.1 x 3.1 cm with a max SUV of 4.7. This area of nodularity corresponds with the focus of restricted diffusion seen on MRI abdomen January 06, 2022. Interval resolution of the masslike hypermetabolic nodularity in the gastrohepatic ligament. No abnormal FDG avidity in the liver or spleen. No hypermetabolic abdominal or  pelvic lymph nodes. Incidental CT findings: Right renal cyst is considered benign and requires no independent imaging follow-up. Nonobstructive left renal stone. Aortic atherosclerosis. Gastric and small bowel anastomotic sutures. Rectosigmoid anastomotic sutures. SKELETON: Diffuse low-level hypermetabolic marrow activity for reference in the T12 vertebral body with a max SUV of 4.3. Incidental CT findings: Lucent lesion with narrow zone of transition in the left femoral head with internal chondroid matrix is similar dating back to at least June 11, 2016 and compatible with a benign enchondroma. Transitional L5-S1 anatomy. IMPRESSION: 1. Interval decrease in size and FDG avidity of the pancreatic tail mass, now with only a small minimally metabolic nodular focus in the pancreatic tail which corresponds enhancing focus of restricted diffusion seen on MRI abdomen January 06, 2022. 2. Interval resolution of the masslike hypermetabolic nodularity in the gastrohepatic ligament. 3. Interval resolution of the hypermetabolic mediastinal and hilar lymph nodes. 4. Diffuse low-level hypermetabolic marrow activity is favored to reflect marrow reactivation. 5. No evidence of new or progressive hypermetabolic disease. 6. Aortic Atherosclerosis (ICD10-I70.0) and Emphysema (ICD10-J43.9). Electronically Signed   By: Dahlia Bailiff M.D.   On: 01/18/2022 10:44    Assessment    Chronic diarrhea:  back to baseline. Recent Cdiff Ag positive, toxin negative, possibly colonized. Recent ileitis suspected to be chemo related. Monitor for now but if recurrent above baseline diarrhea, would consider repeat stool studies.   GERD/Barrett's: biopsy proven Barrett's in the past but not apparent on more recent EGDs. Recent EGD as outlined.   Anemia/heme positive stool: H/O  IDA requiring IV iron in the past. H/O B12 deficiency chronically supplemented. Last EGD as outlined. Flex sig with poor prep, would advise repeat exam once through surgery and cancer treatments. He will continue to follow with hematology for anemia.    PLAN   If worsening diarrhea, consider repeat Cdiff testing.  Await upcoming pancreatic surgery. We will determine timing of follow up. He should have updated flexible sigmoidoscopy at some point once he gets through cancer treatments.  Call with questions or concerns in the interim.    Laureen Ochs. Bobby Rumpf, [place], [facility]

## 2022-02-10 NOTE — Patient Instructions (Signed)
Please call if you have worsening diarrhea. We would recommend checking stool test for Cdiff infection at that time.  I will follow up on your records after your have surgery next month. We will make follow up visit accordingly.  Call if you have any questions or concerns.

## 2022-02-16 ENCOUNTER — Other Ambulatory Visit (HOSPITAL_COMMUNITY): Payer: Self-pay | Admitting: Hematology

## 2022-02-16 ENCOUNTER — Other Ambulatory Visit: Payer: Self-pay | Admitting: *Deleted

## 2022-02-17 ENCOUNTER — Telehealth: Payer: Self-pay | Admitting: *Deleted

## 2022-02-17 NOTE — Telephone Encounter (Signed)
Received call from [facility] who is the Navigator for Dr. Dayton Scrape.  She called to advise that his Albumin will need to be > 2.5 prior to proceeding with surgery.  Last value 1.8 on 01/29/22.  She has also spoken to his wife who has expressed that he is sleeping all hours and is not eating or drinking.  He also seems to not be responding to phone calls from care teams related to appointments, direction and diet management.  Will reach out to Dietitian to speak to patient if possible that day.

## 2022-02-18 ENCOUNTER — Other Ambulatory Visit: Payer: Self-pay

## 2022-02-18 ENCOUNTER — Other Ambulatory Visit: Payer: Self-pay | Admitting: *Deleted

## 2022-02-18 MED ORDER — HYDROCODONE-ACETAMINOPHEN 5-325 MG PO TABS: 1.0000 | ORAL_TABLET | Freq: Three times a day (TID) | ORAL | 0 refills | Status: DC | PRN

## 2022-02-18 NOTE — Telephone Encounter (Signed)
He is on the schedule for Monday.  Should I add him on for Albumin infusion as well?

## 2022-02-18 NOTE — Telephone Encounter (Signed)
Added.  Thank you.

## 2022-02-19 ENCOUNTER — Other Ambulatory Visit: Payer: Self-pay

## 2022-02-22 ENCOUNTER — Inpatient Hospital Stay: Payer: Medicare Other

## 2022-02-22 ENCOUNTER — Inpatient Hospital Stay: Payer: Medicare Other | Attending: Hematology | Admitting: Hematology

## 2022-02-22 DIAGNOSIS — Z87891 Personal history of nicotine dependence: Secondary | ICD-10-CM | POA: Insufficient documentation

## 2022-02-22 DIAGNOSIS — C252 Malignant neoplasm of tail of pancreas: Secondary | ICD-10-CM | POA: Diagnosis present

## 2022-02-22 DIAGNOSIS — C189 Malignant neoplasm of colon, unspecified: Secondary | ICD-10-CM

## 2022-02-22 DIAGNOSIS — C3492 Malignant neoplasm of unspecified part of left bronchus or lung: Secondary | ICD-10-CM | POA: Insufficient documentation

## 2022-02-22 DIAGNOSIS — R634 Abnormal weight loss: Secondary | ICD-10-CM | POA: Diagnosis not present

## 2022-02-22 DIAGNOSIS — Z86711 Personal history of pulmonary embolism: Secondary | ICD-10-CM | POA: Diagnosis not present

## 2022-02-22 DIAGNOSIS — Z808 Family history of malignant neoplasm of other organs or systems: Secondary | ICD-10-CM | POA: Insufficient documentation

## 2022-02-22 DIAGNOSIS — D508 Other iron deficiency anemias: Secondary | ICD-10-CM | POA: Diagnosis not present

## 2022-02-22 DIAGNOSIS — R197 Diarrhea, unspecified: Secondary | ICD-10-CM | POA: Insufficient documentation

## 2022-02-22 DIAGNOSIS — C259 Malignant neoplasm of pancreas, unspecified: Secondary | ICD-10-CM

## 2022-02-22 DIAGNOSIS — Z85038 Personal history of other malignant neoplasm of large intestine: Secondary | ICD-10-CM | POA: Diagnosis not present

## 2022-02-22 DIAGNOSIS — I10 Essential (primary) hypertension: Secondary | ICD-10-CM | POA: Diagnosis not present

## 2022-02-22 DIAGNOSIS — E8809 Other disorders of plasma-protein metabolism, not elsewhere classified: Secondary | ICD-10-CM

## 2022-02-22 DIAGNOSIS — D649 Anemia, unspecified: Secondary | ICD-10-CM | POA: Insufficient documentation

## 2022-02-22 LAB — CBC WITH DIFFERENTIAL/PLATELET
Abs Immature Granulocytes: 0.01 10*3/uL (ref 0.00–0.07)
Basophils Absolute: 0 10*3/uL (ref 0.0–0.1)
Basophils Relative: 0 %
Eosinophils Absolute: 0.1 10*3/uL (ref 0.0–0.5)
Eosinophils Relative: 2 %
HCT: 28.7 % — ABNORMAL LOW (ref 39.0–52.0)
Hemoglobin: 9.2 g/dL — ABNORMAL LOW (ref 13.0–17.0)
Immature Granulocytes: 0 %
Lymphocytes Relative: 28 %
Lymphs Abs: 1.5 10*3/uL (ref 0.7–4.0)
MCH: 31 pg (ref 26.0–34.0)
MCHC: 32.1 g/dL (ref 30.0–36.0)
MCV: 96.6 fL (ref 80.0–100.0)
Monocytes Absolute: 0.5 10*3/uL (ref 0.1–1.0)
Monocytes Relative: 9 %
Neutro Abs: 3.2 10*3/uL (ref 1.7–7.7)
Neutrophils Relative %: 61 %
Platelets: 119 10*3/uL — ABNORMAL LOW (ref 150–400)
RBC: 2.97 MIL/uL — ABNORMAL LOW (ref 4.22–5.81)
RDW: 15.2 % (ref 11.5–15.5)
WBC: 5.3 10*3/uL (ref 4.0–10.5)
nRBC: 0 % (ref 0.0–0.2)

## 2022-02-22 LAB — COMPREHENSIVE METABOLIC PANEL
ALT: 27 U/L (ref 0–44)
AST: 38 U/L (ref 15–41)
Albumin: <1.5 g/dL — ABNORMAL LOW (ref 3.5–5.0)
Alkaline Phosphatase: 93 U/L (ref 38–126)
Anion gap: 3 — ABNORMAL LOW (ref 5–15)
BUN: 10 mg/dL (ref 8–23)
CO2: 20 mmol/L — ABNORMAL LOW (ref 22–32)
Calcium: 6.8 mg/dL — ABNORMAL LOW (ref 8.9–10.3)
Chloride: 112 mmol/L — ABNORMAL HIGH (ref 98–111)
Creatinine, Ser: 1.13 mg/dL (ref 0.61–1.24)
GFR, Estimated: >60 mL/min (ref >60)
Glucose, Bld: 103 mg/dL — ABNORMAL HIGH (ref 70–99)
Potassium: 4.5 mmol/L (ref 3.5–5.1)
Sodium: 135 mmol/L (ref 135–145)
Total Bilirubin: 0.2 mg/dL — ABNORMAL LOW (ref 0.3–1.2)
Total Protein: 3.8 g/dL — ABNORMAL LOW (ref 6.5–8.1)

## 2022-02-22 LAB — MAGNESIUM: Magnesium: 1.5 mg/dL — ABNORMAL LOW (ref 1.7–2.4)

## 2022-02-22 MED ORDER — MEGESTROL ACETATE 400 MG/10ML PO SUSP: 400.0000 mg | Freq: Two times a day (BID) | ORAL | 1 refills | Status: DC

## 2022-02-22 MED ORDER — SODIUM CHLORIDE 0.9% FLUSH
10.0000 mL | Freq: Once | INTRAVENOUS | Status: AC
  Administered 2022-02-22: 10 mL via INTRAVENOUS

## 2022-02-22 MED ORDER — ESCITALOPRAM OXALATE 10 MG PO TABS: 10.0000 mg | ORAL_TABLET | Freq: Every day | ORAL | 1 refills | Status: DC

## 2022-02-22 MED ORDER — HEPARIN SOD (PORK) LOCK FLUSH 100 UNIT/ML IV SOLN
500.0000 [IU] | Freq: Once | INTRAVENOUS | Status: AC
  Administered 2022-02-22: 500 [IU] via INTRAVENOUS

## 2022-02-22 NOTE — Patient Instructions (Signed)
[place]  Discharge Instructions  You were seen and examined today by Dr. Delton Coombes.  Dr. Delton Coombes discussed your most recent lab work which revealed that your albumin and magnesium is low.  Increase your magnesium to twice daily and start taking megace that Dr. Delton Coombes sent in for your appetite. Dr. Delton Coombes also sent a referral to the nutritionist to reach out to you and help with protein intake.  Follow-up as scheduled in 2 weeks.    Thank you for choosing [facility] to provide your oncology and hematology care.   To afford each patient quality time with our provider, please arrive at least 15 minutes before your scheduled appointment time. You may need to reschedule your appointment if you arrive late (10 or more minutes). Arriving late affects you and other patients whose appointments are after yours.  Also, if you miss three or more appointments without notifying the office, you may be dismissed from the clinic at the provider's discretion.    Again, thank you for choosing [facility].  Our hope is that these requests will decrease the amount of time that you wait before being seen by our physicians.   If you have a lab appointment with the [facility] please come in thru the Main Entrance and check in at the main information desk.           _____________________________________________________________  Should you have questions after your visit to [facility], please contact our office at [phone] and follow the prompts.  Our office hours are 8:00 a.m. to 4:30 p.m. Monday - Thursday and 8:00 a.m. to 2:30 p.m. Friday.  Please note that voicemails left after 4:00 p.m. may not be returned until the following business day.  We are closed weekends and all major holidays.  You do have access to a nurse 24-7, just call the main number to the clinic [phone] and do not press any options,  hold on the line and a nurse will answer the phone.    For prescription refill requests, have your pharmacy contact our office and allow 72 hours.    Masks are optional in the cancer centers. If you would like for your care team to wear a mask while they are taking care of you, please let them know. You may have one support person who is at least 71 years old accompany you for your appointments.

## 2022-02-22 NOTE — Patient Instructions (Signed)
[name]  Discharge Instructions: Thank you for choosing [facility] to provide your oncology and hematology care.  If you have a lab appointment with the [facility], please come in thru the Main Entrance and check in at the main information desk.  Wear comfortable clothing and clothing appropriate for easy access to any Portacath or PICC line.   We strive to give you quality time with your provider. You may need to reschedule your appointment if you arrive late (15 or more minutes).  Arriving late affects you and other patients whose appointments are after yours.  Also, if you miss three or more appointments without notifying the office, you may be dismissed from the clinic at the provider's discretion.      For prescription refill requests, have your pharmacy contact our office and allow 72 hours for refills to be completed.    Today you received the following chemotherapy and/or immunotherapy agents Port flush labs      To help prevent nausea and vomiting after your treatment, we encourage you to take your nausea medication as directed.  BELOW ARE SYMPTOMS THAT SHOULD BE REPORTED IMMEDIATELY: *FEVER GREATER THAN 100.4 F (38 C) OR HIGHER *CHILLS OR SWEATING *NAUSEA AND VOMITING THAT IS NOT CONTROLLED WITH YOUR NAUSEA MEDICATION *UNUSUAL SHORTNESS OF BREATH *UNUSUAL BRUISING OR BLEEDING *URINARY PROBLEMS (pain or burning when urinating, or frequent urination) *BOWEL PROBLEMS (unusual diarrhea, constipation, pain near the anus) TENDERNESS IN MOUTH AND THROAT WITH OR WITHOUT PRESENCE OF ULCERS (sore throat, sores in mouth, or a toothache) UNUSUAL RASH, SWELLING OR PAIN  UNUSUAL VAGINAL DISCHARGE OR ITCHING   Items with * indicate a potential emergency and should be followed up as soon as possible or go to the Emergency Department if any problems should occur.  Please show the CHEMOTHERAPY ALERT CARD or IMMUNOTHERAPY ALERT CARD at check-in to the  Emergency Department and triage nurse.  Should you have questions after your visit or need to cancel or reschedule your appointment, please contact [facility] [phone]  and follow the prompts.  Office hours are 8:00 a.m. to 4:30 p.m. Monday - Friday. Please note that voicemails left after 4:00 p.m. may not be returned until the following business day.  We are closed weekends and major holidays. You have access to a nurse at all times for urgent questions. Please call the main number to the clinic [phone] and follow the prompts.  For any non-urgent questions, you may also contact your provider using MyChart. We now offer e-Visits for anyone 19 and older to request care online for non-urgent symptoms. For details visit [email or web address].   Also download the MyChart app! Go to the app store, search "MyChart", open the app, select [facility], and log in with your MyChart username and password.  Masks are optional in the cancer centers. If you would like for your care team to wear a mask while they are taking care of you, please let them know. You may have one support person who is at least 71 years old accompany you for your appointments.

## 2022-02-22 NOTE — Progress Notes (Signed)
Patients port flushed without difficulty.  Good blood return noted with no bruising or swelling noted at site.  Band aid applied.  VSS with discharge and left in satisfactory condition with no s/s of distress noted.   

## 2022-02-23 ENCOUNTER — Inpatient Hospital Stay: Payer: Medicare Other

## 2022-02-23 ENCOUNTER — Other Ambulatory Visit: Payer: Self-pay

## 2022-02-25 ENCOUNTER — Inpatient Hospital Stay: Payer: Medicare Other | Admitting: Dietician

## 2022-02-25 NOTE — Progress Notes (Signed)
Nutrition Assessment  Reason for Assessment: MST/Referral (11/2 - RD attempted to contact pt, left VM)  ASSESSMENT: 71 year old male with pancreatic cancer. He is currently receiving modified Folfirinox q14d. Patient is under the care of Dr. Delton Coombes.   Past medical history includes HTN, pulmonary embolism on anticoagulation, HTN, CKD stage III, B12 deficiency, colon cancer s/p partial gastrectomy, recurrent SBO, short gut syndrome s/p ileorectal anastomosis, chronic diarrhea, pancreatic insufficiency  Spoke with patient via telephone. Patient reports appetite is not the greatest, eats 2 sometimes 3 times/day. He endorses chronic diarrhea secondary to short bowel syndrome. Patient is taking Creon for EPI, recalls 2 (36,000) with snacks and 3 (36,000) with meals.   Toast/couple of eggs for breakfast - coffee, V8 juice Lunch around 1-2PM - hotdog (mustard, chili, onion, slaw), grilled cheese - tea Supper - plate of spaghetti, toast. Currently in [place] - recalls scallops and raw oysters last night.  Patient is unable to tolerate regular Ensure/Boost. These go right through him. Patient is drinking one Ensure Plant Based supplement (180 kcal, 20 g).   Nutrition Focused Physical Exam: unable to complete (telephone appointment)   Medications: eliquis, lipitor, vit D, D3, bentyl, norco, zofran, lomotil, lexapro, ferrous sulfate, gabapentin, megace, remeron   Labs: 12/4 - Mg 1.5, glucose 103, albumin <1.5   Anthropometrics:   Height: 5'8" Weight: 125 lb 3.2 (12/4) UBW: 140 lb (04/13/21) BMI: 19.04   Estimated Energy Needs  Kcals: 1700-1988 Protein: 85-97 Fluid: >1.5 L   NUTRITION DIAGNOSIS: Unintentional weight loss related to chronic illness (colon cancer s/p ileorectal anastomosis, chronic diarrhea, pancreatic cancer) as evidenced by 11% (15 lb) weight loss in 11 months. Insignificant for time frame, however concerning    INTERVENTION:  Educated on importance of protein and  calories to maintain weight/strength as well as in post-operative healing  Discussed strategies for SGS - encouraged 6-8 small meals daily with focus on protein, limiting greasy/fatty/high sugar foods, avoid drinking large amounts of fluids with meals Educated on foods with protein, offered suggestions on ways to include protein source with all meals/snacks Continue drinking Ensure Plant Based, recommend 2x/day  Patient agreeable to bedtime snack Educated on Creon dosing and how to take for maximum benefit Will mail handouts with snack ideas, high protein foods, pancreatic insufficiency, diet for dumping syndrome, Ensure coupons Will provide [employer] 1.4 samples for pt to try   MONITORING, EVALUATION, GOAL: Pt will tolerate increased calories and protein to promote weight gain   Next Visit: Monday 12/11 via telephone

## 2022-02-26 ENCOUNTER — Other Ambulatory Visit: Payer: Self-pay

## 2022-03-02 ENCOUNTER — Encounter: Payer: Self-pay | Admitting: Licensed Clinical Social Worker

## 2022-03-02 ENCOUNTER — Encounter: Payer: Self-pay | Admitting: Hematology

## 2022-03-02 DIAGNOSIS — Z1379 Encounter for other screening for genetic and chromosomal anomalies: Secondary | ICD-10-CM | POA: Insufficient documentation

## 2022-03-02 NOTE — Telephone Encounter (Signed)
Attempted to reach patient x3 to review genetic test results with no answer. Will update patient chart and send results to Dr. Delton Coombes.

## 2022-03-03 ENCOUNTER — Other Ambulatory Visit: Payer: Self-pay | Admitting: *Deleted

## 2022-03-03 DIAGNOSIS — R634 Abnormal weight loss: Secondary | ICD-10-CM

## 2022-03-03 DIAGNOSIS — C252 Malignant neoplasm of tail of pancreas: Secondary | ICD-10-CM

## 2022-03-03 DIAGNOSIS — K8689 Other specified diseases of pancreas: Secondary | ICD-10-CM

## 2022-03-03 DIAGNOSIS — Z903 Acquired absence of stomach [part of]: Secondary | ICD-10-CM

## 2022-03-03 NOTE — Progress Notes (Signed)
Orders placed for additional labs per Dr/ Delton Coombes.

## 2022-03-04 ENCOUNTER — Inpatient Hospital Stay: Payer: Medicare Other

## 2022-03-04 ENCOUNTER — Inpatient Hospital Stay (HOSPITAL_BASED_OUTPATIENT_CLINIC_OR_DEPARTMENT_OTHER): Payer: Medicare Other | Admitting: Hematology

## 2022-03-04 VITALS — Temp 98.2°F | Wt 117.9 lb

## 2022-03-04 VITALS — BP 133/78 | HR 95 | Resp 18

## 2022-03-04 DIAGNOSIS — Z903 Acquired absence of stomach [part of]: Secondary | ICD-10-CM

## 2022-03-04 DIAGNOSIS — C252 Malignant neoplasm of tail of pancreas: Secondary | ICD-10-CM | POA: Diagnosis not present

## 2022-03-04 DIAGNOSIS — Z95828 Presence of other vascular implants and grafts: Secondary | ICD-10-CM

## 2022-03-04 DIAGNOSIS — R634 Abnormal weight loss: Secondary | ICD-10-CM

## 2022-03-04 DIAGNOSIS — E8809 Other disorders of plasma-protein metabolism, not elsewhere classified: Secondary | ICD-10-CM

## 2022-03-04 DIAGNOSIS — D508 Other iron deficiency anemias: Secondary | ICD-10-CM

## 2022-03-04 DIAGNOSIS — K8689 Other specified diseases of pancreas: Secondary | ICD-10-CM

## 2022-03-04 LAB — CBC WITH DIFFERENTIAL/PLATELET
Abs Immature Granulocytes: 0.02 10*3/uL (ref 0.00–0.07)
Basophils Absolute: 0 10*3/uL (ref 0.0–0.1)
Basophils Relative: 0 %
Eosinophils Absolute: 0.1 10*3/uL (ref 0.0–0.5)
Eosinophils Relative: 1 %
HCT: 30.2 % — ABNORMAL LOW (ref 39.0–52.0)
Hemoglobin: 9.8 g/dL — ABNORMAL LOW (ref 13.0–17.0)
Immature Granulocytes: 0 %
Lymphocytes Relative: 21 %
Lymphs Abs: 1.2 10*3/uL (ref 0.7–4.0)
MCH: 30.8 pg (ref 26.0–34.0)
MCHC: 32.5 g/dL (ref 30.0–36.0)
MCV: 95 fL (ref 80.0–100.0)
Monocytes Absolute: 0.7 10*3/uL (ref 0.1–1.0)
Monocytes Relative: 12 %
Neutro Abs: 3.9 10*3/uL (ref 1.7–7.7)
Neutrophils Relative %: 66 %
Platelets: 147 10*3/uL — ABNORMAL LOW (ref 150–400)
RBC: 3.18 MIL/uL — ABNORMAL LOW (ref 4.22–5.81)
RDW: 14.8 % (ref 11.5–15.5)
WBC: 5.9 10*3/uL (ref 4.0–10.5)
nRBC: 0 % (ref 0.0–0.2)

## 2022-03-04 LAB — COMPREHENSIVE METABOLIC PANEL
ALT: 22 U/L (ref 0–44)
AST: 29 U/L (ref 15–41)
Albumin: 1.7 g/dL — ABNORMAL LOW (ref 3.5–5.0)
Alkaline Phosphatase: 95 U/L (ref 38–126)
Anion gap: 6 (ref 5–15)
BUN: 14 mg/dL (ref 8–23)
CO2: 16 mmol/L — ABNORMAL LOW (ref 22–32)
Calcium: 6.7 mg/dL — ABNORMAL LOW (ref 8.9–10.3)
Chloride: 111 mmol/L (ref 98–111)
Creatinine, Ser: 1.45 mg/dL — ABNORMAL HIGH (ref 0.61–1.24)
GFR, Estimated: 52 mL/min — ABNORMAL LOW (ref >60)
Glucose, Bld: 94 mg/dL (ref 70–99)
Potassium: 3.7 mmol/L (ref 3.5–5.1)
Sodium: 133 mmol/L — ABNORMAL LOW (ref 135–145)
Total Bilirubin: 0.3 mg/dL (ref 0.3–1.2)
Total Protein: 4.3 g/dL — ABNORMAL LOW (ref 6.5–8.1)

## 2022-03-04 LAB — IRON AND TIBC
Iron: 67 ug/dL (ref 45–182)
Saturation Ratios: 48 % — ABNORMAL HIGH (ref 17.9–39.5)
TIBC: 138 ug/dL — ABNORMAL LOW (ref 250–450)
UIBC: 71 ug/dL

## 2022-03-04 LAB — PHOSPHORUS: Phosphorus: 3.4 mg/dL (ref 2.5–4.6)

## 2022-03-04 LAB — MAGNESIUM: Magnesium: 2.6 mg/dL — ABNORMAL HIGH (ref 1.7–2.4)

## 2022-03-04 LAB — FERRITIN: Ferritin: 519 ng/mL — ABNORMAL HIGH (ref 24–336)

## 2022-03-04 LAB — PREALBUMIN: Prealbumin: 19 mg/dL (ref 18–38)

## 2022-03-04 LAB — TRIGLYCERIDES: Triglycerides: 48 mg/dL (ref <150)

## 2022-03-04 MED ORDER — SODIUM CHLORIDE 0.9% FLUSH
10.0000 mL | Freq: Once | INTRAVENOUS | Status: AC
  Administered 2022-03-04: 10 mL via INTRAVENOUS

## 2022-03-04 MED ORDER — HEPARIN SOD (PORK) LOCK FLUSH 100 UNIT/ML IV SOLN
500.0000 [IU] | Freq: Once | INTRAVENOUS | Status: AC
  Administered 2022-03-04: 500 [IU] via INTRAVENOUS

## 2022-03-04 NOTE — Patient Instructions (Signed)
[place] at [facility] Discharge Instructions   You were seen and examined today by Dr. Delton Coombes.     Thank you for choosing [place] at [facility] to provide your oncology and hematology care.  To afford each patient quality time with our provider, please arrive at least 15 minutes before your scheduled appointment time.   If you have a lab appointment with the [facility] please come in thru the Main Entrance and check in at the main information desk.  You need to re-schedule your appointment should you arrive 10 or more minutes late.  We strive to give you quality time with our providers, and arriving late affects you and other patients whose appointments are after yours.  Also, if you no show three or more times for appointments you may be dismissed from the clinic at the providers discretion.     Again, thank you for choosing [facility].  Our hope is that these requests will decrease the amount of time that you wait before being seen by our physicians.       _____________________________________________________________  Should you have questions after your visit to [facility], please contact our office at [phone] and follow the prompts.  Our office hours are 8:00 a.m. and 4:30 p.m. Monday - Friday.  Please note that voicemails left after 4:00 p.m. may not be returned until the following business day.  We are closed weekends and major holidays.  You do have access to a nurse 24-7, just call the main number to the clinic [phone] and do not press any options, hold on the line and a nurse will answer the phone.    For prescription refill requests, have your pharmacy contact our office and allow 72 hours.    Due to Covid, you will need to wear a mask upon entering the hospital. If you do not have a mask, a mask will be given to you at the Main Entrance upon arrival. For doctor visits, patients may  have 1 support person age 60 or older with them. For treatment visits, patients can not have anyone with them due to social distancing guidelines and our immunocompromised population.

## 2022-03-05 ENCOUNTER — Encounter: Payer: Self-pay | Admitting: Hematology

## 2022-03-05 NOTE — Progress Notes (Signed)
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   CURRENT THERAPY: FOLFIRINOX every 2 weeks  BRIEF ONCOLOGIC HISTORY:  Oncology History  Pancreatic cancer ([place])  10/07/2021 Initial Diagnosis   Pancreatic cancer ([place])   10/13/2021 - 11/19/2021 Chemotherapy   Patient is on Treatment Plan : PANCREAS Modified FOLFIRINOX q14d x 4 cycles     10/13/2021 -  Chemotherapy   Patient is on Treatment Plan : PANCREAS Modified FOLFIRINOX q14d x 4 cycles       CANCER STAGING:  Cancer Staging  Adenocarcinoma of colon with mucinous features Staging form: Colon and Rectum, AJCC 7th Edition - Clinical: Stage IIIB (T3, N1, M0) - Signed by Baird Cancer, PA on 09/22/2010  Pancreatic cancer [facility]) Staging form: Exocrine Pancreas, AJCC 8th Edition - Clinical stage from 10/07/2021: Stage IIB (cT1, cN1, cM0) - Unsigned    INTERVAL HISTORY:  [name] 71 y.o. male seen for follow-up after cycle 6 cycles of FOLFIRINOX.  He reports energy levels are 50%.  Numbness in the feet has been stable.  He had to go to the ER around 10 days ago with shortness of breath.  He was found to have a hemoglobin 7.2 and received 2 units PRBC.  Shortness of breath improved.  He lost about 1.6 pounds from last visit.  Baseline diarrhea is stable and controlled with Imodium and Lomotil.   REVIEW OF SYSTEMS:  Review of Systems  Gastrointestinal:  Positive for diarrhea and nausea.  Neurological:  Positive for numbness (feet).  Psychiatric/Behavioral:  Positive for sleep disturbance.   All other systems reviewed and are negative.    PAST MEDICAL/SURGICAL HISTORY:  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   Stage 3   Anemia    Anxiety     Arthritis    Barrett's esophagus    Blood transfusion    Bowel obstruction ([place]) 05/13/2012   Recurrent   Bronchitis    Chest pain at rest    Chronic abdominal pain    Erosive esophagitis    ETOH abuse    quit 03/2010   GERD (gastroesophageal reflux disease)    Hx of Clostridium difficile infection 01/2012   Hypertension    Ileus ([place])    Iron deficiency anemia 03/23/2016   Lung cancer ([place])    Obstruction of bowel ([place]) 03/03/2014   Osteoporosis    Pancreatic cancer ([facility])    Personal history of PE (pulmonary embolism) 10/01/2010   Pneumonia    Pulmonary embolism ([place]) 02/2010   Recurrent upper respiratory infection (URI)    Renal disorder    S/P endoscopy 09/28/2010   erosive reflux esophagitis, Billroth I anatomy   S/P partial gastrectomy 1980s   Seizures ([place])    was on meds for 6 months, Unknown etiology, last seizure was 2017.   Shortness of breath    TIA (transient ischemic attack) 12/2009   Vitamin B12 deficiency    Past Surgical History:  Procedure Laterality Date   ABDOMINAL ADHESION SURGERY  03/04/15   @ [facility]   ABDOMINAL EXPLORATION SURGERY     abdominal sugery     for bowel obstruction x 8, all in 1980s, except for one in 07/2010   APPENDECTOMY  1980s  Billroth 1 hemigastrectomy  1980s   per patient for benign duodenal tumor   BIOPSY  01/10/2022   Procedure: BIOPSY;  Surgeon: Eloise Harman, DO;  [facility];  Service: Endoscopy;;  gastric,ileum   CARDIAC CATHETERIZATION  07/17/2012   CHOLECYSTECTOMY  1980s   COLON SURGERY  May 2012   left hemicolectomy, colon cancer found at time of surgery for bowel obstruction   COLON SURGERY  10/12/2018   At [facility]: Resection of right colon and terminal ileum with creation of ileorectal anastomosis for large bowel obstruction.  Cecum and ascending colon noted to be attached to the rectum.   COLONOSCOPY  03/18/2011   anastomosis at 35cm. Several adenomatous polyps removed. Sigmoid diverticulosis. Next  TCS 02/2013   COLONOSCOPY N/A 07/24/2012   DXI:[id number] post segmental resection with normal-appearing colonic anastomosis aside from an adjacent polyp-removed as described above. Rectal polyp-removed as described above. CT findings appear to have been artifactual. tubular adenomas/prolapsed type polyp.   COLONOSCOPY N/A 05/15/2015   Procedure: COLONOSCOPY;  Surgeon: [name], MD;  [facility];  Service: Endoscopy;  Laterality: N/A;   COLONOSCOPY  09/26/2018   at [facility]: prep not adequate for colon cancer surveillance.  Prior end-to-end colonic anastomosis in the rectosigmoid region.  This was patent and characterized by healthy-appearing mucosa.  Anastomosis was traversed.  Normal terminal ileum.   COLONOSCOPY WITH PROPOFOL N/A 11/04/2016   Dr. [name], status post subtotal colectomy with normal-appearing residual lower GI tract.  Next colonoscopy in 5 years.   ESOPHAGOGASTRODUODENOSCOPY  09/28/2010   ESOPHAGOGASTRODUODENOSCOPY  12/01/2010   Cervical web status post dilation, erosive esophagitis, B1 hemigastrectomy, inflamed anastomosis   ESOPHAGOGASTRODUODENOSCOPY  04/16/2011   excoriation at [facility] c/w trauma/M-W tear, friable gastric anastomosis, dilation efferent limb   ESOPHAGOGASTRODUODENOSCOPY N/A 06/03/2014   Dr.[name]- cervcal esopphageal web s/p dilation. abnormal distal esophagus bx= barretts esophagus   ESOPHAGOGASTRODUODENOSCOPY N/A 05/15/2015   Procedure: ESOPHAGOGASTRODUODENOSCOPY (EGD);  Surgeon: [name], MD;  [facility];  Service: Endoscopy;  Laterality: N/A;  230   ESOPHAGOGASTRODUODENOSCOPY (EGD) WITH ESOPHAGEAL DILATION  02/25/2012   NLZ:[id number] esophageal web-s/p dilation anddisruption as described above. Status post prior gastric with Billroth I configuration. Abnormal gastric mucosa at the anastomosis. Gastric biopsy showed mild chronic inflammation but no H. pylori    ESOPHAGOGASTRODUODENOSCOPY (EGD) WITH PROPOFOL N/A 11/04/2016   Dr. [name]: Reflux  esophagitis, small hiatal hernia status post hemigastrectomy.  Single patent efferent small bowel limb appeared normal, 2 x 2 centimeter tongue of salmon epithelium again seen, esophageal dilation.  Biopsies consistent with reflux changes, not Barrett's however this was confirmed on prior EGDs.  Offer 3-year follow-up EGD August 2021.   ESOPHAGOGASTRODUODENOSCOPY (EGD) WITH PROPOFOL N/A 03/03/2020   Procedure: ESOPHAGOGASTRODUODENOSCOPY (EGD) WITH PROPOFOL;  Surgeon: [name], MD;  [facility];  Service: Endoscopy;  Laterality: N/A;  3:00pm   ESOPHAGOGASTRODUODENOSCOPY (EGD) WITH PROPOFOL N/A 01/10/2022   Procedure: ESOPHAGOGASTRODUODENOSCOPY (EGD) WITH PROPOFOL;  Surgeon: Eloise Harman, DO;  [facility];  Service: Endoscopy;  Laterality: N/A;   FLEXIBLE SIGMOIDOSCOPY N/A 01/10/2022   Procedure: FLEXIBLE SIGMOIDOSCOPY;  Surgeon: Eloise Harman, DO;  [facility];  Service: Endoscopy;  Laterality: N/A;   HERNIA REPAIR     right inguinal   MALONEY DILATION N/A 06/03/2014   Procedure: [name] DILATION;  Surgeon: [name], MD;  [facility];  Service: Endoscopy;  Laterality: N/A;   MALONEY DILATION N/A 05/15/2015  Procedure: MALONEY DILATION;  Surgeon: [name], MD;  [facility];  Service: Endoscopy;  Laterality: N/A;   MALONEY DILATION N/A 11/04/2016   Procedure: [name] DILATION;  Surgeon: [name], MD;  [facility];  Service: Endoscopy;  Laterality: N/A;   MALONEY DILATION N/A 03/03/2020   Procedure: [name] DILATION;  Surgeon: [name], MD;  [facility];  Service: Endoscopy;  Laterality: N/A;   PORTACATH PLACEMENT     SAVORY DILATION N/A 06/03/2014   Procedure: SAVORY DILATION;  Surgeon: [name], MD;  [facility];  Service: Endoscopy;  Laterality: N/A;     SOCIAL HISTORY:  Social History   Socioeconomic History   Marital status: Married    Spouse name:  Not on file   Number of children: 3   Years of education: Not on file   Highest education level: Not on file  Occupational History    Employer: [employer]  Tobacco Use   Smoking status: Former    Packs/day: 0.50    Years: 40.00    Total pack years: 20.00    Types: Cigarettes    Quit date: 12/20/2012    Years since quitting: 9.2   Smokeless tobacco: Never  Vaping Use   Vaping Use: Never used  Substance and Sexual Activity   Alcohol use: No   Drug use: No   Sexual activity: Not Currently  Other Topics Concern   Not on file  Social History Narrative   Not on file   Social Determinants of Health   Financial Resource Strain: Not on file  Food Insecurity: No Food Insecurity (01/09/2022)   Hunger Vital Sign    Worried About Running Out of Food in the Last Year: Never true    Ran Out of Food in the Last Year: Never true  Transportation Needs: No Transportation Needs (01/09/2022)   PRAPARE - Hydrologist (Medical): No    Lack of Transportation (Non-Medical): No  Physical Activity: Not on file  Stress: Not on file  Social Connections: Not on file  Intimate Partner Violence: Not At Risk (01/09/2022)   Humiliation, Afraid, Rape, and Kick questionnaire    Fear of Current or Ex-Partner: No    Emotionally Abused: No    Physically Abused: No    Sexually Abused: No    FAMILY HISTORY:  Family History  Problem Relation Age of Onset   Hypertension Mother    Arthritis Mother    Pneumonia Mother    Hypertension Father    Heart attack Father    Bone cancer Cousin    Colon cancer Neg Hx     CURRENT MEDICATIONS:  Outpatient Encounter Medications as of 03/04/2022  Medication Sig   apixaban (ELIQUIS) 5 MG TABS tablet Take 1 tablet (5 mg total) by mouth 2 (two) times daily. Restart 01/11/22   atorvastatin (LIPITOR) 20 MG tablet Take 20 mg by mouth daily.   Bacillus Coagulans-Inulin (PROBIOTIC) 1-250 BILLION-MG CAPS Take 1 capsule by mouth daily.    chlorproMAZINE (THORAZINE) 25 MG tablet Take 1 tablet (25 mg total) by mouth every 6 (six) hours as needed. (Patient taking differently: Take 25 mg by mouth every 6 (six) hours as needed for nausea or vomiting.)   Cholecalciferol (VITAMIN D) 2000 units CAPS Take 2,000 Units by mouth daily.   Cholecalciferol (VITAMIN D3) 10 MCG (400 UNIT) tablet Take 400 Units by mouth daily.   CREON 36000-114000 units CPEP  capsule Take 72,000-108,000 Units by mouth See admin instructions. Take 3 capsules (108,000 units) by mouth with meals and take 2 capsules (72,000 units) with snacks.   cyanocobalamin 100 MCG tablet Take by mouth.   diclofenac Sodium (VOLTAREN) 1 % GEL Apply 4 g topically 4 (four) times daily.   dicyclomine (BENTYL) 10 MG capsule Take by mouth.   diphenoxylate-atropine (LOMOTIL) 2.5-0.025 MG tablet Take 1 tablet by mouth 4 times daily   escitalopram (LEXAPRO) 10 MG tablet Take 1 tablet (10 mg total) by mouth daily.   ferrous sulfate 325 (65 FE) MG EC tablet Take by mouth.   gabapentin (NEURONTIN) 100 MG capsule TAKE 1 CAPSULE BY MOUTH EVERY DAY AT BEDTIME (Patient taking differently: Take 100 mg by mouth at bedtime.)   HYDROcodone-acetaminophen (NORCO/VICODIN) 5-325 MG tablet Take 1 tablet by mouth every 8 (eight) hours as needed for moderate pain.   magnesium oxide (MAG-OX) 400 (240 Mg) MG tablet TAKE 1 TABLET BY MOUTH TWICE A DAY   megestrol (MEGACE) 400 MG/10ML suspension Take 10 mLs (400 mg total) by mouth 2 (two) times daily.   mirtazapine (REMERON) 30 MG tablet TAKE 1 TABLET BY MOUTH AT BEDTIME   Multiple Vitamin (MULTIVITAMIN WITH MINERALS) TABS tablet Take 1 tablet by mouth daily.   ondansetron (ZOFRAN-ODT) 4 MG disintegrating tablet TAKE 1 TABLET BY MOUTH EVERY 4 HOURS AS NEEDED FOR NAUSEA AND VOMITING (Patient taking differently: Take 4 mg by mouth every 4 (four) hours as needed for vomiting or nausea.)   pantoprazole (PROTONIX) 40 MG tablet Take 1 tablet (40 mg total) by mouth 2 (two)  times daily before a meal.   PREVALITE 4 g packet Take 1 packet (4 g total) by mouth daily. DO NOT TAKE WITHIN 2 HOURS OF OTHER MEDICATIONS   prochlorperazine (COMPAZINE) 10 MG tablet Take 10 mg by mouth every 6 (six) hours as needed for vomiting or nausea.   sucralfate (CARAFATE) 1 GM/10ML suspension TAKE 10 MLS (1 G TOTAL) BY MOUTH 4 (FOUR) TIMES DAILY AS NEEDED. FOR BREAKTHROUGH HEARTBURN (Patient taking differently: Take 1 g by mouth 4 (four) times daily as needed (breakthrough heartburn).)   traMADol (ULTRAM) 50 MG tablet Take 1 tablet by mouth every 6 (six) hours as needed for moderate pain or severe pain.   Facility-Administered Encounter Medications as of 03/04/2022  Medication   acetaminophen (TYLENOL) 325 MG tablet   heparin lock flush 100 unit/mL   loratadine (CLARITIN) 10 MG tablet   sodium chloride flush (NS) 0.9 % injection 10 mL   [COMPLETED] sodium chloride flush (NS) 0.9 % injection 10 mL    ALLERGIES:  No Known Allergies   PHYSICAL EXAM:  ECOG Performance status: 1  Vitals:   03/04/22 1218  Temp: 98.2 F (36.8 C)   Filed Weights   03/04/22 1218  Weight: 117 lb 15.1 oz (53.5 kg)   Physical Exam Vitals reviewed.  Constitutional:      Appearance: Normal appearance.  Cardiovascular:     Rate and Rhythm: Normal rate and regular rhythm.     Heart sounds: Normal heart sounds.  Pulmonary:     Breath sounds: Normal breath sounds.  Abdominal:     Palpations: Abdomen is soft. There is no mass.  Neurological:     General: No focal deficit present.     Mental Status: He is alert and oriented to person, place, and time.  Psychiatric:        Mood and Affect: Mood normal.  Behavior: Behavior normal.      LABORATORY DATA:  I have reviewed the labs as listed.  CBC    Component Value Date/Time   WBC 5.9 03/04/2022 1113   RBC 3.18 (L) 03/04/2022 1113   HGB 9.8 (L) 03/04/2022 1113   HCT 30.2 (L) 03/04/2022 1113   PLT 147 (L) 03/04/2022 1113   MCV 95.0  03/04/2022 1113   MCH 30.8 03/04/2022 1113   MCHC 32.5 03/04/2022 1113   RDW 14.8 03/04/2022 1113   LYMPHSABS 1.2 03/04/2022 1113   MONOABS 0.7 03/04/2022 1113   EOSABS 0.1 03/04/2022 1113   BASOSABS 0.0 03/04/2022 1113      Latest Ref Rng & Units 03/04/2022   11:13 AM 02/22/2022    2:40 PM 01/20/2022    9:28 AM  CMP  Glucose 70 - 99 mg/dL 94  103  79   BUN 8 - 23 mg/dL 14  10  11    Creatinine 0.61 - 1.24 mg/dL 1.45  1.13  1.52   Sodium 135 - 145 mmol/L 133  135  134   Potassium 3.5 - 5.1 mmol/L 3.7  4.5  4.3   Chloride 98 - 111 mmol/L 111  112  111   CO2 22 - 32 mmol/L 16  20  17    Calcium 8.9 - 10.3 mg/dL 6.7  6.8  7.0   Total Protein 6.5 - 8.1 g/dL 4.3  3.8  4.2   Total Bilirubin 0.3 - 1.2 mg/dL 0.3  0.2  0.3   Alkaline Phos 38 - 126 U/L 95  93  129   AST 15 - 41 U/L 29  38  21   ALT 0 - 44 U/L 22  27  16      DIAGNOSTIC IMAGING:  I have independently reviewed the scans and discussed with the patient.  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  No lymphadenopathy. - EUS and FNA (09/21/2021): Oval mass in the pancreatic tail, hypoechoic, 20 mm x 17 mm.  Remainder of the pancreas shows decreased visualization of the pancreas. - Pathology: Invasive adenocarcinoma, ductal type, moderately to poorly differentiated.  Background desmoplastic stroma, blood and pancreatic tissue.  Morphologically distinct from prior lung adenocarcinoma.  TTF-1 and Napsin A were negative. - Patient evaluated by Dr. Dayton Scrape at [facility] for pancreatic resection. - CA 19-9 (10/02/2021): 6.47 - PET CT scan (10/02/2021): Ill-defined 4 cm mass in the left upper quadrant in the region of the pancreatic tail with mild hypermetabolism, SUV 4.7.  Soft tissue nodule in the gastrohepatic ligament measuring 2 x 1.3 cm, SUV 4.0.  Suspicious for metastatic lymphadenopathy.  9 mm  mediastinal lymph node and AP window is hypermetabolic SUV 4.9.  Borderline enlarged left hilar lymph nodes show hypermetabolic activity SUV 3.4.  Previously seen subpleural nodule in the superior right lower lobe has nearly completely resolved.  Overall mild hypermetabolic mediastinal and left hilar lymph nodes which are nonspecific.  May be reactive in etiology although metastatic disease cannot definitely be excluded.        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 -Last EGD/colonoscopy on 05/15/2015 was normal. - CT scan in October 2018 was negative for metastatic disease. 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  Margins negative.  2 hilar lymph nodes were negative. - No adjuvant chemotherapy was needed.    Social/family history: - Lives at home with his wife.  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  No chemical exposure.  Quit smoking in 2014 and smoked 1 pack/day for 44 years. - Paternal cousin has bone cancer.    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 - PET scan on 01/14/2022: Decrease in size and FDG activity of pancreatic tail mass.  Resolution of masslike hypermetabolic nodularity in the gastrohepatic ligament.  Resolution of hypermetabolic mediastinal and hilar lymph nodes. - His surgery is being delayed due to his poor nutrition status.  Dr. Gleason wants to operate on him when his albumin is at least 2.  Today it is 1.7. - He will continue Megace.  We have given him Dillard [place] and told him to drink at least 2 cans with  nutrition.  If he is not eating any solid foods, he should drink 4 to 5 cans/day.  He has some diarrhea with it. - Will consider Creon with meals.  I will reach out to Dr. Gleason and make a plan.   2.  Normocytic anemia: - Ferritin is 519, percent saturation 48.  Previously received Ferahem on 11/17/2021 e.  Hemoglobin today is 9.8 from inflammation and CKD.   3.  Chronic diarrhea: -Continue Imodium 2 tablets/day and Lomotil 4 times daily.   4.  Weight loss: -He lost about 8 pounds since 02/22/2022. - He reportedly had problems with TPN with visual and auditory hallucinations. - He is currently drinking 1 protein shake daily. - Continue Megace twice daily. - Start Dillard Essex at least 2 cans/day with meals.     Orders placed this encounter:  No orders of the defined types were placed in this encounter.      Derek Jack, MD [place] [phone]

## 2022-03-08 ENCOUNTER — Ambulatory Visit: Payer: Medicare Other | Admitting: Dietician

## 2022-03-08 ENCOUNTER — Other Ambulatory Visit: Payer: Self-pay | Admitting: *Deleted

## 2022-03-08 DIAGNOSIS — R634 Abnormal weight loss: Secondary | ICD-10-CM

## 2022-03-08 DIAGNOSIS — E8809 Other disorders of plasma-protein metabolism, not elsewhere classified: Secondary | ICD-10-CM

## 2022-03-08 DIAGNOSIS — K8689 Other specified diseases of pancreas: Secondary | ICD-10-CM

## 2022-03-08 DIAGNOSIS — C252 Malignant neoplasm of tail of pancreas: Secondary | ICD-10-CM

## 2022-03-08 DIAGNOSIS — D508 Other iron deficiency anemias: Secondary | ICD-10-CM

## 2022-03-08 MED ORDER — ESCITALOPRAM OXALATE 20 MG PO TABS: 20.0000 mg | ORAL_TABLET | Freq: Every day | ORAL | 0 refills | Status: DC

## 2022-03-08 NOTE — Progress Notes (Signed)
Nutrition Follow-up:  Patient with stage II adenocarcinoma of pancreatic tail. He is receiving Folfirnox q14d.   Spoke with wife [name]) via telephone. Patient and wife have decided not to move forward with TPN due to previous experience/reaction had when on this before. Wife reports patient is eating very little. Recalls 1/2 of hamburger patty last night for supper. She thinks he may have eaten 6-7 oysters yesterday afternoon. Patient is eating these raw. [name] reports patient gets "stuck" on foods which currently is oysters. She reports "maybe" 1/3 cup of mac/cheese on Saturday. Patient tried [employer] 1.4 supplement during 12/14 office visit. This immediately sent him to the bathroom per wife. He has not tried drinking anymore. She has not seen him consume any Ensure Plant based since returning home from [place]. Patient is not taking Creon as prescribed. Wife reports currently looking at a completely full bottle that was filled 11/18/21.    Medications: reviewed   Labs: 12/14 Na 133, Cr 1.45, albumin 1.7, Corrected Ca 8.1  Anthropometrics: Wt 117 lb 15.1 oz on 12/14 decreased 6.4% in 10 days - severe  12/4 - 125 lb 3.2 oz  Estimated Energy Needs  Kcals: 4034-7425 Protein: 85-97 Fluid: 1.5 L  NUTRITION DIAGNOSIS: Unintentional weight loss continues    INTERVENTION:  Reinforced importance of adequate calorie/protein energy intake  Patient is not candidate for EN and has declined TPN Reinforced education on rationale for taking Creon as prescribed Encouraged pt to take 2 Creon with [name] 1.4 (455 kcal, 20 g protein, 19 g total fat) - drink 3x/day - pt agreeable to try  Educated on food safety - strongly recommend pt avoid raw seafood (suggested having oyster stew to provide additional calories/protein) Take appetite stimulant as prescribed per MD     MONITORING, EVALUATION, GOAL: weight trends, intake   NEXT VISIT: Will follow-up via phone 12/19 to assess for  supplement tolerance

## 2022-03-08 NOTE — Telephone Encounter (Signed)
Per Dr. Delton Coombes, Lexapro increased to 20 mg daily.  Patient made aware.

## 2022-03-09 ENCOUNTER — Ambulatory Visit: Payer: Medicare Other | Admitting: Hematology

## 2022-03-09 ENCOUNTER — Other Ambulatory Visit: Payer: Self-pay

## 2022-03-09 ENCOUNTER — Other Ambulatory Visit: Payer: Medicare Other

## 2022-03-10 ENCOUNTER — Ambulatory Visit: Payer: Medicare Other | Admitting: Nutrition

## 2022-03-10 NOTE — Progress Notes (Signed)
Received call from RN that patient's wife requesting phone call from RD.  Contacted Shirley. She reports patient took 1 Creon and then consumed a bottle of [employer] 1.4. He immediately had diarrhea and had to spend the rest of the night in bed.  Patient told her that he would now like to consider TPN. States the "bad experience" with TPN in the past was from a medication he was given at the same time as the TPN. Both this medication and TPN were discontinued at the same time so he is not clear on what caused his bad reaction. Wife reports there is no way patient can eat enough to improve his nutrition.  Will send Dr. Delton Coombes a message with this information.   Patient needs per Lajuan Lines, RD on Dec 7 were as follows:  1700-1988 calories, 85-97 grams protein, >1.5 L fluid.  Recommend monitoring for refeeding syndrome. Check CMET, Magnesium and Phosphorus minimum 2 times weekly and replete as needed.  Will ask Vinnie Level to follow up when she is back in the office.  Please consult this RD if needed before.

## 2022-03-11 ENCOUNTER — Other Ambulatory Visit: Payer: Self-pay

## 2022-03-16 ENCOUNTER — Encounter: Payer: Self-pay | Admitting: *Deleted

## 2022-03-16 ENCOUNTER — Other Ambulatory Visit: Payer: Self-pay | Admitting: *Deleted

## 2022-03-16 DIAGNOSIS — C189 Malignant neoplasm of colon, unspecified: Secondary | ICD-10-CM

## 2022-03-16 DIAGNOSIS — R634 Abnormal weight loss: Secondary | ICD-10-CM

## 2022-03-16 DIAGNOSIS — K8689 Other specified diseases of pancreas: Secondary | ICD-10-CM

## 2022-03-16 NOTE — Progress Notes (Signed)
Per Dr. Delton Coombes, patient has agreed to home TPN.  Spoke to [employer] with [facility] to coordinate.  He will be having required labs 12/27 and Pam will meet he and his wife to provide education.

## 2022-03-17 ENCOUNTER — Other Ambulatory Visit: Payer: Medicare Other

## 2022-03-17 ENCOUNTER — Inpatient Hospital Stay: Payer: Medicare Other

## 2022-03-17 ENCOUNTER — Other Ambulatory Visit: Payer: Self-pay | Admitting: Hematology

## 2022-03-17 ENCOUNTER — Other Ambulatory Visit: Payer: Self-pay | Admitting: *Deleted

## 2022-03-17 DIAGNOSIS — Z903 Acquired absence of stomach [part of]: Secondary | ICD-10-CM

## 2022-03-17 DIAGNOSIS — C252 Malignant neoplasm of tail of pancreas: Secondary | ICD-10-CM

## 2022-03-17 DIAGNOSIS — R197 Diarrhea, unspecified: Secondary | ICD-10-CM

## 2022-03-17 DIAGNOSIS — R112 Nausea with vomiting, unspecified: Secondary | ICD-10-CM

## 2022-03-17 DIAGNOSIS — R634 Abnormal weight loss: Secondary | ICD-10-CM

## 2022-03-17 DIAGNOSIS — K8689 Other specified diseases of pancreas: Secondary | ICD-10-CM

## 2022-03-17 DIAGNOSIS — C189 Malignant neoplasm of colon, unspecified: Secondary | ICD-10-CM

## 2022-03-17 DIAGNOSIS — Z85038 Personal history of other malignant neoplasm of large intestine: Secondary | ICD-10-CM

## 2022-03-17 DIAGNOSIS — E8809 Other disorders of plasma-protein metabolism, not elsewhere classified: Secondary | ICD-10-CM

## 2022-03-17 LAB — CBC WITH DIFFERENTIAL/PLATELET
Abs Immature Granulocytes: 0.03 10*3/uL (ref 0.00–0.07)
Basophils Absolute: 0 10*3/uL (ref 0.0–0.1)
Basophils Relative: 0 %
Eosinophils Absolute: 0.1 10*3/uL (ref 0.0–0.5)
Eosinophils Relative: 2 %
HCT: 35.8 % — ABNORMAL LOW (ref 39.0–52.0)
Hemoglobin: 11.6 g/dL — ABNORMAL LOW (ref 13.0–17.0)
Immature Granulocytes: 0 %
Lymphocytes Relative: 28 %
Lymphs Abs: 2.2 10*3/uL (ref 0.7–4.0)
MCH: 30.8 pg (ref 26.0–34.0)
MCHC: 32.4 g/dL (ref 30.0–36.0)
MCV: 95 fL (ref 80.0–100.0)
Monocytes Absolute: 0.8 10*3/uL (ref 0.1–1.0)
Monocytes Relative: 10 %
Neutro Abs: 4.7 10*3/uL (ref 1.7–7.7)
Neutrophils Relative %: 60 %
Platelets: 214 10*3/uL (ref 150–400)
RBC: 3.77 MIL/uL — ABNORMAL LOW (ref 4.22–5.81)
RDW: 15.3 % (ref 11.5–15.5)
WBC: 7.9 10*3/uL (ref 4.0–10.5)
nRBC: 0 % (ref 0.0–0.2)

## 2022-03-17 LAB — COMPREHENSIVE METABOLIC PANEL
ALT: 27 U/L (ref 0–44)
AST: 31 U/L (ref 15–41)
Albumin: 1.7 g/dL — ABNORMAL LOW (ref 3.5–5.0)
Alkaline Phosphatase: 88 U/L (ref 38–126)
Anion gap: 6 (ref 5–15)
BUN: 8 mg/dL (ref 8–23)
CO2: 16 mmol/L — ABNORMAL LOW (ref 22–32)
Calcium: 7.5 mg/dL — ABNORMAL LOW (ref 8.9–10.3)
Chloride: 118 mmol/L — ABNORMAL HIGH (ref 98–111)
Creatinine, Ser: 1.41 mg/dL — ABNORMAL HIGH (ref 0.61–1.24)
GFR, Estimated: 53 mL/min — ABNORMAL LOW (ref >60)
Glucose, Bld: 105 mg/dL — ABNORMAL HIGH (ref 70–99)
Potassium: 3.9 mmol/L (ref 3.5–5.1)
Sodium: 140 mmol/L (ref 135–145)
Total Bilirubin: 0.4 mg/dL (ref 0.3–1.2)
Total Protein: 4.7 g/dL — ABNORMAL LOW (ref 6.5–8.1)

## 2022-03-17 LAB — PHOSPHORUS: Phosphorus: 2.9 mg/dL (ref 2.5–4.6)

## 2022-03-17 LAB — PREALBUMIN: Prealbumin: 21 mg/dL (ref 18–38)

## 2022-03-17 LAB — TRIGLYCERIDES: Triglycerides: 53 mg/dL (ref <150)

## 2022-03-17 LAB — MAGNESIUM: Magnesium: 1.3 mg/dL — ABNORMAL LOW (ref 1.7–2.4)

## 2022-03-17 MED ORDER — HYDROCODONE-ACETAMINOPHEN 5-325 MG PO TABS: 1.0000 | ORAL_TABLET | Freq: Three times a day (TID) | ORAL | 0 refills | Status: DC | PRN

## 2022-03-17 NOTE — Progress Notes (Signed)
This patient has very poor oral intake due to lack of appetite not responding to appetite stimulants.  He also has pancreatic insufficiency and malabsorption.  As he is planning to have Whipple's procedure for his pancreatic cancer in a timely manner, I feel that he will benefit from TPN with vast improvement in his albumin levels as requested by his surgeon to be more than 2.  Due to his previous surgeries, I do not believe feeding tube will help replenish his dietary deficiencies.  TPN will be needed long term to meet his nutritional needs.

## 2022-03-19 ENCOUNTER — Encounter: Payer: Self-pay | Admitting: Hematology

## 2022-03-19 NOTE — Progress Notes (Signed)
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   CURRENT THERAPY: FOLFIRINOX every 2 weeks  BRIEF ONCOLOGIC HISTORY:  Oncology History  Pancreatic [name] ([place])  10/07/2021 Initial Diagnosis   Pancreatic [name] ([place])   10/13/2021 - 11/19/2021 Chemotherapy   Patient is on Treatment Plan : PANCREAS Modified FOLFIRINOX q14d x 4 cycles     10/13/2021 -  Chemotherapy   Patient is on Treatment Plan : PANCREAS Modified FOLFIRINOX q14d x 4 cycles       [name] STAGING:  [name] Staging  Adenocarcinoma of colon with mucinous features Staging form: Colon and Rectum, AJCC 7th Edition - Clinical: Stage IIIB (T3, N1, M0) - Signed by [name] Cancer, PA on 09/22/2010  Pancreatic [name] [facility]) Staging form: Exocrine Pancreas, AJCC 8th Edition - Clinical stage from 10/07/2021: Stage IIB (cT1, cN1, cM0) - Unsigned    INTERVAL HISTORY:  [name] 71 y.o. male seen for follow-up of pancreatic [name].  Cycle 6 of FOLFIRINOX was on 12/30/2021.  Reports energy levels are 50%.  He is drinking about 1 protein shake per day.  Tried medical marijuana tincture sublingually which did not help.  Eating 2 meals per day.   REVIEW OF SYSTEMS:  Review of Systems  Gastrointestinal:  Positive for diarrhea.  Neurological:  Positive for numbness (feet).  Psychiatric/Behavioral:  Positive for sleep disturbance.   All other systems reviewed and are negative.    PAST MEDICAL/SURGICAL HISTORY:  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   Stage 3   Anemia    Anxiety    Arthritis    Barrett's esophagus    Blood transfusion    Bowel obstruction ([place]) 05/13/2012   Recurrent   Bronchitis    Chest pain at rest    Chronic  abdominal pain    Erosive esophagitis    ETOH abuse    quit 03/2010   GERD (gastroesophageal reflux disease)    Hx of Clostridium difficile infection 01/2012   Hypertension    Ileus ([place])    Iron deficiency anemia 03/23/2016   Lung [name] ([place])    Obstruction of bowel ([place]) 03/03/2014   Osteoporosis    Pancreatic [name] ([facility])    Personal history of PE (pulmonary embolism) 10/01/2010   Pneumonia    Pulmonary embolism ([place]) 02/2010   Recurrent upper respiratory infection (URI)    Renal disorder    S/P endoscopy 09/28/2010   erosive reflux esophagitis, Billroth I anatomy   S/P partial gastrectomy 1980s   Seizures ([place])    was on meds for 6 months, Unknown etiology, last seizure was 2017.   Shortness of breath    TIA (transient ischemic attack) 12/2009   Vitamin B12 deficiency    Past Surgical History:  Procedure Laterality Date   ABDOMINAL ADHESION SURGERY  03/04/15   @ [facility]   ABDOMINAL EXPLORATION SURGERY     abdominal sugery     for bowel obstruction x 8, all in 1980s, except for one in 07/2010   APPENDECTOMY  1980s   Billroth 1 hemigastrectomy  1980s   per patient for benign duodenal tumor   BIOPSY  01/10/2022   Procedure: BIOPSY;  Surgeon: Eloise Harman, DO;  [facility];  Service: Endoscopy;;  gastric,ileum   CARDIAC CATHETERIZATION  07/17/2012   CHOLECYSTECTOMY  1980s   COLON SURGERY  May 2012   left hemicolectomy, colon [name] found at time of surgery for bowel obstruction   COLON SURGERY  10/12/2018   At [facility]: Resection of right colon and terminal ileum with creation of ileorectal anastomosis for large bowel obstruction.  Cecum and ascending colon noted to be attached to the rectum.   COLONOSCOPY  03/18/2011   anastomosis at 35cm. Several adenomatous polyps removed. Sigmoid diverticulosis. Next TCS 02/2013   COLONOSCOPY N/A 07/24/2012   VQM:GQQPYP post segmental resection with normal-appearing colonic anastomosis aside from an adjacent polyp-removed as  described above. Rectal polyp-removed as described above. CT findings appear to have been artifactual. tubular adenomas/prolapsed type polyp.   COLONOSCOPY N/A 05/15/2015   Procedure: COLONOSCOPY;  Surgeon: [name], MD;  [facility];  Service: Endoscopy;  Laterality: N/A;   COLONOSCOPY  09/26/2018   at [facility]: prep not adequate for colon [name] surveillance.  Prior end-to-end colonic anastomosis in the rectosigmoid region.  This was patent and characterized by healthy-appearing mucosa.  Anastomosis was traversed.  Normal terminal ileum.   COLONOSCOPY WITH PROPOFOL N/A 11/04/2016   Dr. [name], status post subtotal colectomy with normal-appearing residual lower GI tract.  Next colonoscopy in 5 years.   ESOPHAGOGASTRODUODENOSCOPY  09/28/2010   ESOPHAGOGASTRODUODENOSCOPY  12/01/2010   Cervical web status post dilation, erosive esophagitis, B1 hemigastrectomy, inflamed anastomosis   ESOPHAGOGASTRODUODENOSCOPY  04/16/2011   excoriation at [facility] c/w trauma/M-W tear, friable gastric anastomosis, dilation efferent limb   ESOPHAGOGASTRODUODENOSCOPY N/A 06/03/2014   Dr.[name]- cervcal esopphageal web s/p dilation. abnormal distal esophagus bx= barretts esophagus   ESOPHAGOGASTRODUODENOSCOPY N/A 05/15/2015   Procedure: ESOPHAGOGASTRODUODENOSCOPY (EGD);  Surgeon: [name], MD;  [facility];  Service: Endoscopy;  Laterality: N/A;  230   ESOPHAGOGASTRODUODENOSCOPY (EGD) WITH ESOPHAGEAL DILATION  02/25/2012   PJK:[id number] esophageal web-s/p dilation anddisruption as described above. Status post prior gastric with Billroth I configuration. Abnormal gastric mucosa at the anastomosis. Gastric biopsy showed mild chronic inflammation but no H. pylori    ESOPHAGOGASTRODUODENOSCOPY (EGD) WITH PROPOFOL N/A 11/04/2016   Dr. [name]: Reflux esophagitis, small hiatal hernia status post hemigastrectomy.  Single patent efferent small bowel limb appeared normal, 2 x 2 centimeter tongue of salmon epithelium  again seen, esophageal dilation.  Biopsies consistent with reflux changes, not Barrett's however this was confirmed on prior EGDs.  Offer 3-year follow-up EGD August 2021.   ESOPHAGOGASTRODUODENOSCOPY (EGD) WITH PROPOFOL N/A 03/03/2020   Procedure: ESOPHAGOGASTRODUODENOSCOPY (EGD) WITH PROPOFOL;  Surgeon: [name], MD;  [facility];  Service: Endoscopy;  Laterality: N/A;  3:00pm   ESOPHAGOGASTRODUODENOSCOPY (EGD) WITH PROPOFOL N/A 01/10/2022   Procedure: ESOPHAGOGASTRODUODENOSCOPY (EGD) WITH PROPOFOL;  Surgeon: Eloise Harman, DO;  [facility];  Service: Endoscopy;  Laterality: N/A;   FLEXIBLE SIGMOIDOSCOPY N/A 01/10/2022   Procedure: FLEXIBLE SIGMOIDOSCOPY;  Surgeon: Eloise Harman, DO;  [facility];  Service: Endoscopy;  Laterality: N/A;   HERNIA REPAIR     right inguinal   MALONEY DILATION N/A 06/03/2014   Procedure: [name] DILATION;  Surgeon: [name], MD;  [facility];  Service: Endoscopy;  Laterality: N/A;   MALONEY DILATION N/A 05/15/2015   Procedure: [name] DILATION;  Surgeon: [name], MD;  [facility];  Service: Endoscopy;  Laterality: N/A;   MALONEY DILATION N/A 11/04/2016   Procedure: [name] DILATION;  Surgeon: [name],  Cristopher Estimable, MD;  [facility];  Service: Endoscopy;  Laterality: N/A;   MALONEY DILATION N/A 03/03/2020   Procedure: [name] DILATION;  Surgeon: [name], MD;  [facility];  Service: Endoscopy;  Laterality: N/A;   PORTACATH PLACEMENT     SAVORY DILATION N/A 06/03/2014   Procedure: SAVORY DILATION;  Surgeon: [name], MD;  [facility];  Service: Endoscopy;  Laterality: N/A;     SOCIAL HISTORY:  Social History   Socioeconomic History   Marital status: Married    Spouse name: Not on file   Number of children: 3   Years of education: Not on file   Highest education level: Not on file  Occupational History    Employer: [employer]   Tobacco Use   Smoking status: Former    Packs/day: 0.50    Years: 40.00    Total pack years: 20.00    Types: Cigarettes    Quit date: 12/20/2012    Years since quitting: 9.2   Smokeless tobacco: Never  Vaping Use   Vaping Use: Never used  Substance and Sexual Activity   Alcohol use: No   Drug use: No   Sexual activity: Not Currently  Other Topics Concern   Not on file  Social History Narrative   Not on file   Social Determinants of Health   Financial Resource Strain: Not on file  Food Insecurity: No Food Insecurity (01/09/2022)   Hunger Vital Sign    Worried About Running Out of Food in the Last Year: Never true    Ran Out of Food in the Last Year: Never true  Transportation Needs: No Transportation Needs (01/09/2022)   PRAPARE - Hydrologist (Medical): No    Lack of Transportation (Non-Medical): No  Physical Activity: Not on file  Stress: Not on file  Social Connections: Not on file  Intimate Partner Violence: Not At Risk (01/09/2022)   Humiliation, Afraid, Rape, and Kick questionnaire    Fear of Current or Ex-Partner: No    Emotionally Abused: No    Physically Abused: No    Sexually Abused: No    FAMILY HISTORY:  Family History  Problem Relation Age of Onset   Hypertension Mother    Arthritis Mother    Pneumonia Mother    Hypertension Father    Heart attack Father    Bone [name] Cousin    Colon [name] Neg Hx     CURRENT MEDICATIONS:  Outpatient Encounter Medications as of 02/22/2022  Medication Sig   apixaban (ELIQUIS) 5 MG TABS tablet Take 1 tablet (5 mg total) by mouth 2 (two) times daily. Restart 01/11/22   atorvastatin (LIPITOR) 20 MG tablet Take 20 mg by mouth daily.   Bacillus Coagulans-Inulin (PROBIOTIC) 1-250 BILLION-MG CAPS Take 1 capsule by mouth daily.   chlorproMAZINE (THORAZINE) 25 MG tablet Take 1 tablet (25 mg total) by mouth every 6 (six) hours as needed. (Patient taking differently: Take 25 mg by mouth every 6  (six) hours as needed for nausea or vomiting.)   Cholecalciferol (VITAMIN D) 2000 units CAPS Take 2,000 Units by mouth daily.   Cholecalciferol (VITAMIN D3) 10 MCG (400 UNIT) tablet Take 400 Units by mouth daily.   CREON 36000-114000 units CPEP capsule Take 72,000-108,000 Units by mouth See admin instructions. Take 3 capsules (108,000 units) by mouth with meals and take 2 capsules (72,000 units) with snacks.   cyanocobalamin 100 MCG tablet Take by  mouth.   diclofenac Sodium (VOLTAREN) 1 % GEL Apply 4 g topically 4 (four) times daily.   dicyclomine (BENTYL) 10 MG capsule Take by mouth.   diphenoxylate-atropine (LOMOTIL) 2.5-0.025 MG tablet Take 1 tablet by mouth 4 times daily   ferrous sulfate 325 (65 FE) MG EC tablet Take by mouth.   gabapentin (NEURONTIN) 100 MG capsule TAKE 1 CAPSULE BY MOUTH EVERY DAY AT BEDTIME (Patient taking differently: Take 100 mg by mouth at bedtime.)   magnesium oxide (MAG-OX) 400 (240 Mg) MG tablet TAKE 1 TABLET BY MOUTH TWICE A DAY   megestrol (MEGACE) 400 MG/10ML suspension Take 10 mLs (400 mg total) by mouth 2 (two) times daily.   mirtazapine (REMERON) 30 MG tablet TAKE 1 TABLET BY MOUTH AT BEDTIME   Multiple Vitamin (MULTIVITAMIN WITH MINERALS) TABS tablet Take 1 tablet by mouth daily.   ondansetron (ZOFRAN-ODT) 4 MG disintegrating tablet TAKE 1 TABLET BY MOUTH EVERY 4 HOURS AS NEEDED FOR NAUSEA AND VOMITING (Patient taking differently: Take 4 mg by mouth every 4 (four) hours as needed for vomiting or nausea.)   pantoprazole (PROTONIX) 40 MG tablet Take 1 tablet (40 mg total) by mouth 2 (two) times daily before a meal.   PREVALITE 4 g packet Take 1 packet (4 g total) by mouth daily. DO NOT TAKE WITHIN 2 HOURS OF OTHER MEDICATIONS   prochlorperazine (COMPAZINE) 10 MG tablet Take 10 mg by mouth every 6 (six) hours as needed for vomiting or nausea.   sucralfate (CARAFATE) 1 GM/10ML suspension TAKE 10 MLS (1 G TOTAL) BY MOUTH 4 (FOUR) TIMES DAILY AS NEEDED. FOR  BREAKTHROUGH HEARTBURN (Patient taking differently: Take 1 g by mouth 4 (four) times daily as needed (breakthrough heartburn).)   traMADol (ULTRAM) 50 MG tablet Take 1 tablet by mouth every 6 (six) hours as needed for moderate pain or severe pain.   [DISCONTINUED] escitalopram (LEXAPRO) 10 MG tablet Take 1 tablet (10 mg total) by mouth daily.   [DISCONTINUED] HYDROcodone-acetaminophen (NORCO/VICODIN) 5-325 MG tablet Take 1 tablet by mouth every 8 (eight) hours as needed for moderate pain.   [DISCONTINUED] magnesium oxide (MAG-OX) 400 MG tablet Take by mouth.   Facility-Administered Encounter Medications as of 02/22/2022  Medication   acetaminophen (TYLENOL) 325 MG tablet   heparin lock flush 100 unit/mL   [COMPLETED] heparin lock flush 100 unit/mL   loratadine (CLARITIN) 10 MG tablet   sodium chloride flush (NS) 0.9 % injection 10 mL   [COMPLETED] sodium chloride flush (NS) 0.9 % injection 10 mL    ALLERGIES:  No Known Allergies   PHYSICAL EXAM:  ECOG Performance status: 1  There were no vitals filed for this visit. There were no vitals filed for this visit. Physical Exam Vitals reviewed.  Constitutional:      Appearance: Normal appearance.  Cardiovascular:     Rate and Rhythm: Normal rate and regular rhythm.     Heart sounds: Normal heart sounds.  Pulmonary:     Breath sounds: Normal breath sounds.  Abdominal:     Palpations: Abdomen is soft. There is no mass.  Neurological:     General: No focal deficit present.     Mental Status: He is alert and oriented to person, place, and time.  Psychiatric:        Mood and Affect: Mood normal.        Behavior: Behavior normal.      LABORATORY DATA:  I have reviewed the labs as listed.  CBC  Component Value Date/Time   WBC 7.9 03/17/2022 1009   RBC 3.77 (L) 03/17/2022 1009   HGB 11.6 (L) 03/17/2022 1009   HCT 35.8 (L) 03/17/2022 1009   PLT 214 03/17/2022 1009   MCV 95.0 03/17/2022 1009   MCH 30.8 03/17/2022 1009    MCHC 32.4 03/17/2022 1009   RDW 15.3 03/17/2022 1009   LYMPHSABS 2.2 03/17/2022 1009   MONOABS 0.8 03/17/2022 1009   EOSABS 0.1 03/17/2022 1009   BASOSABS 0.0 03/17/2022 1009      Latest Ref Rng & Units 03/17/2022   10:09 AM 03/04/2022   11:13 AM 02/22/2022    2:40 PM  CMP  Glucose 70 - 99 mg/dL 105  94  103   BUN 8 - 23 mg/dL 8  14  10    Creatinine 0.61 - 1.24 mg/dL 1.41  1.45  1.13   Sodium 135 - 145 mmol/L 140  133  135   Potassium 3.5 - 5.1 mmol/L 3.9  3.7  4.5   Chloride 98 - 111 mmol/L 118  111  112   CO2 22 - 32 mmol/L 16  16  20    Calcium 8.9 - 10.3 mg/dL 7.5  6.7  6.8   Total Protein 6.5 - 8.1 g/dL 4.7  4.3  3.8   Total Bilirubin 0.3 - 1.2 mg/dL 0.4  0.3  0.2   Alkaline Phos 38 - 126 U/L 88  95  93   AST 15 - 41 U/L 31  29  38   ALT 0 - 44 U/L 27  22  27      DIAGNOSTIC IMAGING:  I have independently reviewed the scans and discussed with the patient.  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  No lymphadenopathy. - EUS and FNA (09/21/2021): Oval mass in the pancreatic tail, hypoechoic, 20 mm x 17 mm.  Remainder of the pancreas shows decreased visualization of the pancreas. - Pathology: Invasive adenocarcinoma, ductal type, moderately to poorly differentiated.  Background desmoplastic stroma, blood and pancreatic tissue.  Morphologically distinct from prior lung adenocarcinoma.  TTF-1 and Napsin A were negative. - Patient evaluated by Dr. Dayton Scrape at [facility] for pancreatic resection. - CA 19-9 (10/02/2021): 6.47 - PET CT scan (10/02/2021): Ill-defined 4 cm mass in the left upper quadrant in the region of the pancreatic tail with mild hypermetabolism, SUV 4.7.  Soft tissue nodule in the gastrohepatic ligament measuring 2 x 1.3 cm, SUV 4.0.  Suspicious for metastatic lymphadenopathy.  9 mm mediastinal lymph node and AP window is  hypermetabolic SUV 4.9.  Borderline enlarged left hilar lymph nodes show hypermetabolic activity SUV 3.4.  Previously seen subpleural nodule in the superior right lower lobe has nearly completely resolved.  Overall mild hypermetabolic mediastinal and left hilar lymph nodes which are nonspecific.  May be reactive in etiology although metastatic disease cannot definitely be excluded.        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 -Last EGD/colonoscopy on 05/15/2015 was normal. - CT scan in October 2018 was negative for metastatic disease. 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  Margins negative.  2 hilar lymph nodes were negative. - No adjuvant chemotherapy was needed.    Social/family history: - Lives at home with his wife.  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  No chemical exposure.  Quit smoking in 2014 and smoked 1 pack/day for 44 years. - Paternal cousin has bone [name].    PLAN:   Stage IIB adenocarcinoma of the pancreatic tail: - PET scan on 01/14/2022 showed decrease in size and FDG activity of the pancreatic tail mass with resolution of masslike hypermetabolic nodularity in the gastrohepatic ligament.  Resolution of hypermetabolic mediastinal and hilar lymph nodes. -. Reviewed labs today which showed all been less than 1.5. - Spoke with Dr. Gleason.  She wants albumin at least close to 2 and above.  We will make a nutrition consult.  We will reevaluate him in 2 weeks.   2.  Weight loss: - He has tried sublingual tincture of medical marijuana which  did not help. - He is drinking 1 protein shake per day and eating 2 meals per day.  Still losing weight. - Will start him on Megace 400 mg twice daily.   3. Iron deficiency: - Hemoglobin today is 9.2.  Last Feraheme on 11/17/2021.   4. 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  5.  Chronic diarrhea: - This is also stable.  Continue Imodium and Lomotil.          Orders placed this encounter:  Orders Placed This Encounter  Procedures   CBC with Differential/Platelet   Comprehensive metabolic panel   Ferritin   Iron and TIBC   Magnesium   Ambulatory Referral to [place], [place] [phone]

## 2022-03-22 ENCOUNTER — Other Ambulatory Visit: Payer: Self-pay

## 2022-03-24 ENCOUNTER — Other Ambulatory Visit: Payer: Medicare Other

## 2022-03-24 ENCOUNTER — Inpatient Hospital Stay: Payer: Medicare Other

## 2022-03-24 ENCOUNTER — Inpatient Hospital Stay: Payer: Medicare Other | Attending: Hematology | Admitting: Hematology

## 2022-03-24 ENCOUNTER — Encounter: Payer: Self-pay | Admitting: Hematology

## 2022-03-24 DIAGNOSIS — R197 Diarrhea, unspecified: Secondary | ICD-10-CM | POA: Diagnosis not present

## 2022-03-24 DIAGNOSIS — C252 Malignant neoplasm of tail of pancreas: Secondary | ICD-10-CM

## 2022-03-24 DIAGNOSIS — Z808 Family history of malignant neoplasm of other organs or systems: Secondary | ICD-10-CM | POA: Diagnosis not present

## 2022-03-24 DIAGNOSIS — E8809 Other disorders of plasma-protein metabolism, not elsewhere classified: Secondary | ICD-10-CM

## 2022-03-24 DIAGNOSIS — D649 Anemia, unspecified: Secondary | ICD-10-CM | POA: Diagnosis not present

## 2022-03-24 DIAGNOSIS — Z87891 Personal history of nicotine dependence: Secondary | ICD-10-CM | POA: Diagnosis not present

## 2022-03-24 DIAGNOSIS — R634 Abnormal weight loss: Secondary | ICD-10-CM

## 2022-03-24 DIAGNOSIS — Z79899 Other long term (current) drug therapy: Secondary | ICD-10-CM | POA: Insufficient documentation

## 2022-03-24 DIAGNOSIS — Z85118 Personal history of other malignant neoplasm of bronchus and lung: Secondary | ICD-10-CM | POA: Insufficient documentation

## 2022-03-24 DIAGNOSIS — Z86711 Personal history of pulmonary embolism: Secondary | ICD-10-CM | POA: Diagnosis not present

## 2022-03-24 DIAGNOSIS — K8689 Other specified diseases of pancreas: Secondary | ICD-10-CM

## 2022-03-24 DIAGNOSIS — D508 Other iron deficiency anemias: Secondary | ICD-10-CM

## 2022-03-24 LAB — CBC WITH DIFFERENTIAL/PLATELET
Abs Immature Granulocytes: 0.02 10*3/uL (ref 0.00–0.07)
Basophils Absolute: 0 10*3/uL (ref 0.0–0.1)
Basophils Relative: 0 %
Eosinophils Absolute: 0 10*3/uL (ref 0.0–0.5)
Eosinophils Relative: 1 %
HCT: 31.2 % — ABNORMAL LOW (ref 39.0–52.0)
Hemoglobin: 10.2 g/dL — ABNORMAL LOW (ref 13.0–17.0)
Immature Granulocytes: 0 %
Lymphocytes Relative: 20 %
Lymphs Abs: 1.3 10*3/uL (ref 0.7–4.0)
MCH: 30.7 pg (ref 26.0–34.0)
MCHC: 32.7 g/dL (ref 30.0–36.0)
MCV: 94 fL (ref 80.0–100.0)
Monocytes Absolute: 0.6 10*3/uL (ref 0.1–1.0)
Monocytes Relative: 9 %
Neutro Abs: 4.5 10*3/uL (ref 1.7–7.7)
Neutrophils Relative %: 70 %
Platelets: 181 10*3/uL (ref 150–400)
RBC: 3.32 MIL/uL — ABNORMAL LOW (ref 4.22–5.81)
RDW: 15.2 % (ref 11.5–15.5)
WBC: 6.4 10*3/uL (ref 4.0–10.5)
nRBC: 0 % (ref 0.0–0.2)

## 2022-03-24 LAB — COMPREHENSIVE METABOLIC PANEL
ALT: 42 U/L (ref 0–44)
AST: 54 U/L — ABNORMAL HIGH (ref 15–41)
Albumin: 1.9 g/dL — ABNORMAL LOW (ref 3.5–5.0)
Alkaline Phosphatase: 86 U/L (ref 38–126)
Anion gap: 6 (ref 5–15)
BUN: 6 mg/dL — ABNORMAL LOW (ref 8–23)
CO2: 17 mmol/L — ABNORMAL LOW (ref 22–32)
Calcium: 7.4 mg/dL — ABNORMAL LOW (ref 8.9–10.3)
Chloride: 110 mmol/L (ref 98–111)
Creatinine, Ser: 1.05 mg/dL (ref 0.61–1.24)
GFR, Estimated: >60 mL/min (ref >60)
Glucose, Bld: 83 mg/dL (ref 70–99)
Potassium: 4.7 mmol/L (ref 3.5–5.1)
Sodium: 133 mmol/L — ABNORMAL LOW (ref 135–145)
Total Bilirubin: 0.5 mg/dL (ref 0.3–1.2)
Total Protein: 4.9 g/dL — ABNORMAL LOW (ref 6.5–8.1)

## 2022-03-24 LAB — PHOSPHORUS: Phosphorus: 3.5 mg/dL (ref 2.5–4.6)

## 2022-03-24 LAB — IRON AND TIBC
Iron: 98 ug/dL (ref 45–182)
Saturation Ratios: 50 % — ABNORMAL HIGH (ref 17.9–39.5)
TIBC: 198 ug/dL — ABNORMAL LOW (ref 250–450)
UIBC: 100 ug/dL

## 2022-03-24 LAB — TRIGLYCERIDES: Triglycerides: 100 mg/dL (ref <150)

## 2022-03-24 LAB — MAGNESIUM: Magnesium: 2 mg/dL (ref 1.7–2.4)

## 2022-03-24 LAB — PREALBUMIN: Prealbumin: 29 mg/dL (ref 18–38)

## 2022-03-24 MED ORDER — SODIUM CHLORIDE 0.9% FLUSH: 10.0000 mL | INTRAVENOUS | Status: DC | PRN

## 2022-03-24 MED ORDER — HEPARIN SOD (PORK) LOCK FLUSH 100 UNIT/ML IV SOLN: 500.0000 [IU] | Freq: Once | INTRAVENOUS | Status: DC

## 2022-03-24 NOTE — Progress Notes (Signed)
Patient arrived today for port flush lab, patient is currently receiving TPN via port. Labs drawn from peripheral site.

## 2022-03-24 NOTE — Patient Instructions (Addendum)
[facility]  Discharge Instructions  You were seen and examined today by Dr. Delton Coombes.  Dr. Delton Coombes discussed your most recent lab work which revealed that your albumin is looking better.   Follow-up as scheduled next Thursday.    Thank you for choosing [facility] to provide your oncology and hematology care.   To afford each patient quality time with our provider, please arrive at least 15 minutes before your scheduled appointment time. You may need to reschedule your appointment if you arrive late (10 or more minutes). Arriving late affects you and other patients whose appointments are after yours.  Also, if you miss three or more appointments without notifying the office, you may be dismissed from the clinic at the provider's discretion.    Again, thank you for choosing [facility].  Our hope is that these requests will decrease the amount of time that you wait before being seen by our physicians.   If you have a lab appointment with the [facility] please come in thru the Main Entrance and check in at the main information desk.           _____________________________________________________________  Should you have questions after your visit to [facility], please contact our office at [phone] and follow the prompts.  Our office hours are 8:00 a.m. to 4:30 p.m. Monday - Thursday and 8:00 a.m. to 2:30 p.m. Friday.  Please note that voicemails left after 4:00 p.m. may not be returned until the following business day.  We are closed weekends and all major holidays.  You do have access to a nurse 24-7, just call the main number to the clinic [phone] and do not press any options, hold on the line and a nurse will answer the phone.    For prescription refill requests, have your pharmacy contact our office and allow 72 hours.    Masks are optional in the cancer centers. If you would like for  your care team to wear a mask while they are taking care of you, please let them know. You may have one support person who is at least 72 years old accompany you for your appointments.

## 2022-03-24 NOTE — Progress Notes (Signed)
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   CURRENT THERAPY: FOLFIRINOX every 2 weeks  BRIEF ONCOLOGIC HISTORY:  Oncology History  Pancreatic cancer ([place])  10/07/2021 Initial Diagnosis   Pancreatic cancer ([place])   10/13/2021 - 11/19/2021 Chemotherapy   Patient is on Treatment Plan : PANCREAS Modified FOLFIRINOX q14d x 4 cycles     10/13/2021 -  Chemotherapy   Patient is on Treatment Plan : PANCREAS Modified FOLFIRINOX q14d x 4 cycles       CANCER STAGING:  Cancer Staging  Adenocarcinoma of colon with mucinous features Staging form: Colon and Rectum, AJCC 7th Edition - Clinical: Stage IIIB (T3, N1, M0) - Signed by Baird Cancer, PA on 09/22/2010  Pancreatic cancer [facility]) Staging form: Exocrine Pancreas, AJCC 8th Edition - Clinical stage from 10/07/2021: Stage IIB (cT1, cN1, cM0) - Unsigned    INTERVAL HISTORY:  Mr. Malina 72 y.o. male seen for follow-up of his pancreatic cancer.  He was started on TPN on 03/19/2022.  So far he did not have any reactions.  He is eating some by mouth.  Weight is stable.  Energy levels are same.   REVIEW OF SYSTEMS:  Review of Systems  Respiratory:  Positive for shortness of breath.   Gastrointestinal:  Positive for diarrhea.  Neurological:  Positive for numbness (feet).  All other systems reviewed and are negative.    PAST MEDICAL/SURGICAL HISTORY:  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   Stage 3   Anemia    Anxiety    Arthritis    Barrett's esophagus    Blood transfusion    Bowel obstruction ([place]) 05/13/2012   Recurrent   Bronchitis    Chest pain at rest    Chronic abdominal pain    Erosive esophagitis    ETOH abuse    quit 03/2010    GERD (gastroesophageal reflux disease)    Hx of Clostridium difficile infection 01/2012   Hypertension    Ileus ([place])    Iron deficiency anemia 03/23/2016   Lung cancer ([place])    Obstruction of bowel ([place]) 03/03/2014   Osteoporosis    Pancreatic cancer ([facility])    Personal history of PE (pulmonary embolism) 10/01/2010   Pneumonia    Pulmonary embolism ([place]) 02/2010   Recurrent upper respiratory infection (URI)    Renal disorder    S/P endoscopy 09/28/2010   erosive reflux esophagitis, Billroth I anatomy   S/P partial gastrectomy 1980s   Seizures ([place])    was on meds for 6 months, Unknown etiology, last seizure was 2017.   Shortness of breath    TIA (transient ischemic attack) 12/2009   Vitamin B12 deficiency    Past Surgical History:  Procedure Laterality Date   ABDOMINAL ADHESION SURGERY  03/04/15   @ [facility]   ABDOMINAL EXPLORATION SURGERY     abdominal sugery     for bowel obstruction x 8, all in 1980s, except for one in 07/2010   APPENDECTOMY  1980s   Billroth 1 hemigastrectomy  1980s   per patient for benign duodenal tumor   BIOPSY  01/10/2022   Procedure: BIOPSY;  Surgeon: Eloise Harman, DO;  [facility];  Service: Endoscopy;;  gastric,ileum  CARDIAC CATHETERIZATION  07/17/2012   CHOLECYSTECTOMY  1980s   COLON SURGERY  May 2012   left hemicolectomy, colon cancer found at time of surgery for bowel obstruction   COLON SURGERY  10/12/2018   At [facility]: Resection of right colon and terminal ileum with creation of ileorectal anastomosis for large bowel obstruction.  Cecum and ascending colon noted to be attached to the rectum.   COLONOSCOPY  03/18/2011   anastomosis at 35cm. Several adenomatous polyps removed. Sigmoid diverticulosis. Next TCS 02/2013   COLONOSCOPY N/A 07/24/2012   [id number] post segmental resection with normal-appearing colonic anastomosis aside from an adjacent polyp-removed as described above. Rectal polyp-removed as described above. CT findings  appear to have been artifactual. tubular adenomas/prolapsed type polyp.   COLONOSCOPY N/A 05/15/2015   Procedure: COLONOSCOPY;  Surgeon: [name], MD;  Location: [facility];  Service: Endoscopy;  Laterality: N/A;   COLONOSCOPY  09/26/2018   at [facility]: prep not adequate for colon cancer surveillance.  Prior end-to-end colonic anastomosis in the rectosigmoid region.  This was patent and characterized by healthy-appearing mucosa.  Anastomosis was traversed.  Normal terminal ileum.   COLONOSCOPY WITH PROPOFOL N/A 11/04/2016   Dr. [name], status post subtotal colectomy with normal-appearing residual lower GI tract.  Next colonoscopy in 5 years.   ESOPHAGOGASTRODUODENOSCOPY  09/28/2010   ESOPHAGOGASTRODUODENOSCOPY  12/01/2010   Cervical web status post dilation, erosive esophagitis, B1 hemigastrectomy, inflamed anastomosis   ESOPHAGOGASTRODUODENOSCOPY  04/16/2011   excoriation at [facility] c/w trauma/M-W tear, friable gastric anastomosis, dilation efferent limb   ESOPHAGOGASTRODUODENOSCOPY N/A 06/03/2014   Dr.[name]- cervcal esopphageal web s/p dilation. abnormal distal esophagus bx= barretts esophagus   ESOPHAGOGASTRODUODENOSCOPY N/A 05/15/2015   Procedure: ESOPHAGOGASTRODUODENOSCOPY (EGD);  Surgeon: [name], MD;  [facility];  Service: Endoscopy;  Laterality: N/A;  230   ESOPHAGOGASTRODUODENOSCOPY (EGD) WITH ESOPHAGEAL DILATION  02/25/2012   [id number] esophageal web-s/p dilation anddisruption as described above. Status post prior gastric with Billroth I configuration. Abnormal gastric mucosa at the anastomosis. Gastric biopsy showed mild chronic inflammation but no H. pylori    ESOPHAGOGASTRODUODENOSCOPY (EGD) WITH PROPOFOL N/A 11/04/2016   Dr. [name]: Reflux esophagitis, small hiatal hernia status post hemigastrectomy.  Single patent efferent small bowel limb appeared normal, 2 x 2 centimeter tongue of salmon epithelium again seen, esophageal dilation.  Biopsies consistent with reflux  changes, not Barrett's however this was confirmed on prior EGDs.  Offer 3-year follow-up EGD August 2021.   ESOPHAGOGASTRODUODENOSCOPY (EGD) WITH PROPOFOL N/A 03/03/2020   Procedure: ESOPHAGOGASTRODUODENOSCOPY (EGD) WITH PROPOFOL;  Surgeon: [name], MD;  [facility];  Service: Endoscopy;  Laterality: N/A;  3:00pm   ESOPHAGOGASTRODUODENOSCOPY (EGD) WITH PROPOFOL N/A 01/10/2022   Procedure: ESOPHAGOGASTRODUODENOSCOPY (EGD) WITH PROPOFOL;  Surgeon: Eloise Harman, DO;  [facility];  Service: Endoscopy;  Laterality: N/A;   FLEXIBLE SIGMOIDOSCOPY N/A 01/10/2022   Procedure: FLEXIBLE SIGMOIDOSCOPY;  Surgeon: Eloise Harman, DO;  [facility];  Service: Endoscopy;  Laterality: N/A;   HERNIA REPAIR     right inguinal   MALONEY DILATION N/A 06/03/2014   Procedure: [name] DILATION;  Surgeon: [name], MD;  [facility];  Service: Endoscopy;  Laterality: N/A;   MALONEY DILATION N/A 05/15/2015   Procedure: [name] DILATION;  Surgeon: [name], MD;  [facility];  Service: Endoscopy;  Laterality: N/A;   MALONEY DILATION N/A 11/04/2016   Procedure: [name] DILATION;  Surgeon: [name], MD;  [facility];  Service: Endoscopy;  Laterality: N/A;   MALONEY DILATION N/A 03/03/2020   Procedure: [name] DILATION;  Surgeon: [name], MD;  [facility];  Service: Endoscopy;  Laterality: N/A;   PORTACATH PLACEMENT     SAVORY DILATION N/A 06/03/2014   Procedure: SAVORY DILATION;  Surgeon: [name], MD;  [facility];  Service: Endoscopy;  Laterality: N/A;     SOCIAL HISTORY:  Social History   Socioeconomic History   Marital status: Married    Spouse name: Not on file   Number of children: 3   Years of education: Not on file   Highest education level: Not on file  Occupational History    Employer: [employer]  Tobacco Use   Smoking status: Former    Packs/day: 0.50     Years: 40.00    Total pack years: 20.00    Types: Cigarettes    Quit date: 12/20/2012    Years since quitting: 9.2   Smokeless tobacco: Never  Vaping Use   Vaping Use: Never used  Substance and Sexual Activity   Alcohol use: No   Drug use: No   Sexual activity: Not Currently  Other Topics Concern   Not on file  Social History Narrative   Not on file   Social Determinants of Health   Financial Resource Strain: Not on file  Food Insecurity: No Food Insecurity (01/09/2022)   Hunger Vital Sign    Worried About Running Out of Food in the Last Year: Never true    Ran Out of Food in the Last Year: Never true  Transportation Needs: No Transportation Needs (01/09/2022)   PRAPARE - Hydrologist (Medical): No    Lack of Transportation (Non-Medical): No  Physical Activity: Not on file  Stress: Not on file  Social Connections: Not on file  Intimate Partner Violence: Not At Risk (01/09/2022)   Humiliation, Afraid, Rape, and Kick questionnaire    Fear of Current or Ex-Partner: No    Emotionally Abused: No    Physically Abused: No    Sexually Abused: No    FAMILY HISTORY:  Family History  Problem Relation Age of Onset   Hypertension Mother    Arthritis Mother    Pneumonia Mother    Hypertension Father    Heart attack Father    Bone cancer Cousin    Colon cancer Neg Hx     CURRENT MEDICATIONS:  Outpatient Encounter Medications as of 03/24/2022  Medication Sig   apixaban (ELIQUIS) 5 MG TABS tablet Take 1 tablet (5 mg total) by mouth 2 (two) times daily. Restart 01/11/22   atorvastatin (LIPITOR) 20 MG tablet Take 20 mg by mouth daily.   Bacillus Coagulans-Inulin (PROBIOTIC) 1-250 BILLION-MG CAPS Take 1 capsule by mouth daily.   chlorproMAZINE (THORAZINE) 25 MG tablet Take 1 tablet (25 mg total) by mouth every 6 (six) hours as needed. (Patient taking differently: Take 25 mg by mouth every 6 (six) hours as needed for nausea or vomiting.)   Cholecalciferol  (VITAMIN D) 2000 units CAPS Take 2,000 Units by mouth daily.   Cholecalciferol (VITAMIN D3) 10 MCG (400 UNIT) tablet Take 400 Units by mouth daily.   CREON 36000-114000 units CPEP capsule Take 72,000-108,000 Units by mouth See admin instructions. Take 3 capsules (108,000 units) by mouth with meals and take 2 capsules (72,000 units) with snacks.   cyanocobalamin 100 MCG tablet Take by mouth.   diclofenac Sodium (VOLTAREN)  1 % GEL Apply 4 g topically 4 (four) times daily.   dicyclomine (BENTYL) 10 MG capsule Take by mouth.   diphenoxylate-atropine (LOMOTIL) 2.5-0.025 MG tablet Take 1 tablet by mouth 4 times daily   escitalopram (LEXAPRO) 20 MG tablet Take 1 tablet (20 mg total) by mouth daily.   ferrous sulfate 325 (65 FE) MG EC tablet Take by mouth.   gabapentin (NEURONTIN) 100 MG capsule TAKE 1 CAPSULE BY MOUTH EVERY DAY AT BEDTIME (Patient taking differently: Take 100 mg by mouth at bedtime.)   HYDROcodone-acetaminophen (NORCO/VICODIN) 5-325 MG tablet Take 1 tablet by mouth every 8 (eight) hours as needed for moderate pain.   magnesium oxide (MAG-OX) 400 (240 Mg) MG tablet TAKE 1 TABLET BY MOUTH TWICE A DAY   megestrol (MEGACE) 400 MG/10ML suspension Take 10 mLs (400 mg total) by mouth 2 (two) times daily.   mirtazapine (REMERON) 30 MG tablet TAKE 1 TABLET BY MOUTH AT BEDTIME   Multiple Vitamin (MULTIVITAMIN WITH MINERALS) TABS tablet Take 1 tablet by mouth daily.   ondansetron (ZOFRAN-ODT) 4 MG disintegrating tablet TAKE 1 TABLET BY MOUTH EVERY 4 HOURS AS NEEDED FOR NAUSEA AND VOMITING (Patient taking differently: Take 4 mg by mouth every 4 (four) hours as needed for vomiting or nausea.)   pantoprazole (PROTONIX) 40 MG tablet Take 1 tablet (40 mg total) by mouth 2 (two) times daily before a meal.   PREVALITE 4 g packet Take 1 packet (4 g total) by mouth daily. DO NOT TAKE WITHIN 2 HOURS OF OTHER MEDICATIONS   prochlorperazine (COMPAZINE) 10 MG tablet Take 10 mg by mouth every 6 (six) hours as  needed for vomiting or nausea.   sucralfate (CARAFATE) 1 GM/10ML suspension TAKE 10 MLS (1 G TOTAL) BY MOUTH 4 (FOUR) TIMES DAILY AS NEEDED. FOR BREAKTHROUGH HEARTBURN (Patient taking differently: Take 1 g by mouth 4 (four) times daily as needed (breakthrough heartburn).)   traMADol (ULTRAM) 50 MG tablet Take 1 tablet by mouth every 6 (six) hours as needed for moderate pain or severe pain.   Facility-Administered Encounter Medications as of 03/24/2022  Medication   acetaminophen (TYLENOL) 325 MG tablet   heparin lock flush 100 unit/mL   heparin lock flush 100 unit/mL   loratadine (CLARITIN) 10 MG tablet   sodium chloride flush (NS) 0.9 % injection 10 mL   sodium chloride flush (NS) 0.9 % injection 10 mL    ALLERGIES:  No Known Allergies   PHYSICAL EXAM:  ECOG Performance status: 1  There were no vitals filed for this visit.  There were no vitals filed for this visit.  Physical Exam Vitals reviewed.  Constitutional:      Appearance: Normal appearance.  Cardiovascular:     Rate and Rhythm: Normal rate and regular rhythm.     Heart sounds: Normal heart sounds.  Pulmonary:     Breath sounds: Normal breath sounds.  Abdominal:     Palpations: Abdomen is soft. There is no mass.  Neurological:     General: No focal deficit present.     Mental Status: He is alert and oriented to person, place, and time.  Psychiatric:        Mood and Affect: Mood normal.        Behavior: Behavior normal.      LABORATORY DATA:  I have reviewed the labs as listed.  CBC    Component Value Date/Time   WBC 6.4 03/24/2022 1441   RBC 3.32 (L) 03/24/2022 1441   HGB 10.2 (  L) 03/24/2022 1441   HCT 31.2 (L) 03/24/2022 1441   PLT 181 03/24/2022 1441   MCV 94.0 03/24/2022 1441   MCH 30.7 03/24/2022 1441   MCHC 32.7 03/24/2022 1441   RDW 15.2 03/24/2022 1441   LYMPHSABS 1.3 03/24/2022 1441   MONOABS 0.6 03/24/2022 1441   EOSABS 0.0 03/24/2022 1441   BASOSABS 0.0 03/24/2022 1441      Latest Ref  Rng & Units 03/24/2022    2:41 PM 03/17/2022   10:09 AM 03/04/2022   11:13 AM  CMP  Glucose 70 - 99 mg/dL 83  105  94   BUN 8 - 23 mg/dL 6  8  14    Creatinine 0.61 - 1.24 mg/dL 1.05  1.41  1.45   Sodium 135 - 145 mmol/L 133  140  133   Potassium 3.5 - 5.1 mmol/L 4.7  3.9  3.7   Chloride 98 - 111 mmol/L 110  118  111   CO2 22 - 32 mmol/L 17  16  16    Calcium 8.9 - 10.3 mg/dL 7.4  7.5  6.7   Total Protein 6.5 - 8.1 g/dL 4.9  4.7  4.3   Total Bilirubin 0.3 - 1.2 mg/dL 0.5  0.4  0.3   Alkaline Phos 38 - 126 U/L 86  88  95   AST 15 - 41 U/L 54  31  29   ALT 0 - 44 U/L 42  27  22     DIAGNOSTIC IMAGING:  I have independently reviewed the scans and discussed with the patient.  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  No lymphadenopathy. - EUS and FNA (09/21/2021): Oval mass in the pancreatic tail, hypoechoic, 20 mm x 17 mm.  Remainder of the pancreas shows decreased visualization of the pancreas. - Pathology: Invasive adenocarcinoma, ductal type, moderately to poorly differentiated.  Background desmoplastic stroma, blood and pancreatic tissue.  Morphologically distinct from prior lung adenocarcinoma.  TTF-1 and Napsin A were negative. - Patient evaluated by Dr. Dayton Scrape at [facility] for pancreatic resection. - CA 19-9 (10/02/2021): 6.47 - PET CT scan (10/02/2021): Ill-defined 4 cm mass in the left upper quadrant in the region of the pancreatic tail with mild hypermetabolism, SUV 4.7.  Soft tissue nodule in the gastrohepatic ligament measuring 2 x 1.3 cm, SUV 4.0.  Suspicious for metastatic lymphadenopathy.  9 mm mediastinal lymph node and AP window is hypermetabolic SUV 4.9.  Borderline enlarged left hilar lymph nodes show hypermetabolic activity SUV 3.4.  Previously seen subpleural nodule in the superior right lower lobe has nearly completely resolved.   Overall mild hypermetabolic mediastinal and left hilar lymph nodes which are nonspecific.  May be reactive in etiology although metastatic disease cannot definitely be excluded.        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 -Last EGD/colonoscopy on 05/15/2015 was normal. - CT scan in October 2018 was negative for metastatic disease. 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  Margins negative.  2 hilar lymph nodes were negative. - No  adjuvant chemotherapy was needed.    Social/family history: - Lives at home with his wife.  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  No chemical exposure.  Quit smoking in 2014 and smoked 1 pack/day for 44 years. - Paternal cousin has bone cancer.    PLAN:   Stage IIB adenocarcinoma of the pancreatic tail: - Last chemotherapy on 12/30/2021. - PET scan on 01/14/2022: Decrease in size and FDG activity of the pancreatic tail mass.  Resolution of masslike hypermetabolic nodularity in the gastrohepatic ligament.  Resolution of hypermetabolic mediastinal lymph nodes. - He was evaluated by Dr. Sander Nephew at [facility].  Surgery was held due to low albumin and weight loss. - TPN started on 03/19/2022.  We will reevaluate him in 1 week and recheck his labs.   2.  Normocytic anemia: - Previous Feraheme on 11/17/2021.  Hemoglobin is 10.2 today.  Will check ferritin next week.  Percent saturation is 50.   3.  Chronic diarrhea: - Continue Imodium and Lomotil daily as needed.   4.  Weight loss: - His weight is stable since last visit on 03/04/2022. - Continues TPN (1300  mL, 125 g dextrose, 50 g Plenamine, 25 g SMOF lipid) started on 03/19/2022. - He was also encouraged to eat by mouth.     Orders placed this encounter:  Orders Placed This Encounter  Procedures   CBC with Differential/Platelet   Comprehensive metabolic panel   Ferritin        Derek Jack, MD [place] [phone]

## 2022-03-25 ENCOUNTER — Other Ambulatory Visit: Payer: Self-pay

## 2022-03-31 ENCOUNTER — Other Ambulatory Visit: Payer: Medicare Other

## 2022-03-31 ENCOUNTER — Ambulatory Visit: Payer: Medicare Other | Admitting: Hematology

## 2022-04-01 ENCOUNTER — Other Ambulatory Visit: Payer: Self-pay

## 2022-04-02 ENCOUNTER — Other Ambulatory Visit: Payer: Self-pay

## 2022-04-02 ENCOUNTER — Emergency Department (HOSPITAL_COMMUNITY): Payer: Medicare Other

## 2022-04-02 ENCOUNTER — Emergency Department (HOSPITAL_COMMUNITY)
Admission: EM | Admit: 2022-04-02 | Discharge: 2022-04-03 | Disposition: A | Discharge status: Home / Self Care | Payer: Medicare Other | Source: Home / Self Care | Attending: Emergency Medicine | Admitting: Emergency Medicine

## 2022-04-02 DIAGNOSIS — Z1152 Encounter for screening for COVID-19: Secondary | ICD-10-CM | POA: Insufficient documentation

## 2022-04-02 DIAGNOSIS — T80211A Bloodstream infection due to central venous catheter, initial encounter: Secondary | ICD-10-CM | POA: Diagnosis not present

## 2022-04-02 DIAGNOSIS — J069 Acute upper respiratory infection, unspecified: Secondary | ICD-10-CM | POA: Insufficient documentation

## 2022-04-02 DIAGNOSIS — Z7901 Long term (current) use of anticoagulants: Secondary | ICD-10-CM | POA: Insufficient documentation

## 2022-04-02 DIAGNOSIS — R7881 Bacteremia: Secondary | ICD-10-CM | POA: Diagnosis not present

## 2022-04-02 DIAGNOSIS — E860 Dehydration: Secondary | ICD-10-CM | POA: Insufficient documentation

## 2022-04-02 DIAGNOSIS — R791 Abnormal coagulation profile: Secondary | ICD-10-CM | POA: Insufficient documentation

## 2022-04-02 DIAGNOSIS — R112 Nausea with vomiting, unspecified: Secondary | ICD-10-CM | POA: Insufficient documentation

## 2022-04-02 LAB — LACTIC ACID, PLASMA: Lactic Acid, Venous: 1.5 mmol/L (ref 0.5–1.9)

## 2022-04-02 LAB — CBC WITH DIFFERENTIAL/PLATELET
Abs Immature Granulocytes: 0.02 10*3/uL (ref 0.00–0.07)
Basophils Absolute: 0 10*3/uL (ref 0.0–0.1)
Basophils Relative: 0 %
Eosinophils Absolute: 0 10*3/uL (ref 0.0–0.5)
Eosinophils Relative: 0 %
HCT: 27.1 % — ABNORMAL LOW (ref 39.0–52.0)
Hemoglobin: 9 g/dL — ABNORMAL LOW (ref 13.0–17.0)
Immature Granulocytes: 1 %
Lymphocytes Relative: 10 %
Lymphs Abs: 0.3 10*3/uL — ABNORMAL LOW (ref 0.7–4.0)
MCH: 31 pg (ref 26.0–34.0)
MCHC: 33.2 g/dL (ref 30.0–36.0)
MCV: 93.4 fL (ref 80.0–100.0)
Monocytes Absolute: 0.2 10*3/uL (ref 0.1–1.0)
Monocytes Relative: 7 %
Neutro Abs: 2.9 10*3/uL (ref 1.7–7.7)
Neutrophils Relative %: 82 %
Platelets: 81 10*3/uL — ABNORMAL LOW (ref 150–400)
RBC: 2.9 MIL/uL — ABNORMAL LOW (ref 4.22–5.81)
RDW: 14.9 % (ref 11.5–15.5)
WBC: 3.5 10*3/uL — ABNORMAL LOW (ref 4.0–10.5)
nRBC: 0 % (ref 0.0–0.2)

## 2022-04-02 LAB — URINALYSIS, ROUTINE W REFLEX MICROSCOPIC
Bacteria, UA: NONE SEEN
Bilirubin Urine: NEGATIVE
Glucose, UA: NEGATIVE mg/dL
Hgb urine dipstick: NEGATIVE
Ketones, ur: NEGATIVE mg/dL
Leukocytes,Ua: NEGATIVE
Nitrite: NEGATIVE
Protein, ur: 30 mg/dL — AB
Specific Gravity, Urine: 1.013 (ref 1.005–1.030)
pH: 5 (ref 5.0–8.0)

## 2022-04-02 LAB — RESP PANEL BY RT-PCR (RSV, FLU A&B, COVID)  RVPGX2
Influenza A by PCR: NEGATIVE
Influenza B by PCR: NEGATIVE
Resp Syncytial Virus by PCR: NEGATIVE
SARS Coronavirus 2 by RT PCR: NEGATIVE

## 2022-04-02 LAB — PROTIME-INR
INR: 1.5 — ABNORMAL HIGH (ref 0.8–1.2)
Prothrombin Time: 18.2 seconds — ABNORMAL HIGH (ref 11.4–15.2)

## 2022-04-02 LAB — COMPREHENSIVE METABOLIC PANEL
ALT: 36 U/L (ref 0–44)
AST: 38 U/L (ref 15–41)
Albumin: 1.9 g/dL — ABNORMAL LOW (ref 3.5–5.0)
Alkaline Phosphatase: 80 U/L (ref 38–126)
Anion gap: 10 (ref 5–15)
BUN: 20 mg/dL (ref 8–23)
CO2: 23 mmol/L (ref 22–32)
Calcium: 7.1 mg/dL — ABNORMAL LOW (ref 8.9–10.3)
Chloride: 95 mmol/L — ABNORMAL LOW (ref 98–111)
Creatinine, Ser: 1.27 mg/dL — ABNORMAL HIGH (ref 0.61–1.24)
GFR, Estimated: >60 mL/min (ref >60)
Glucose, Bld: 103 mg/dL — ABNORMAL HIGH (ref 70–99)
Potassium: 3.1 mmol/L — ABNORMAL LOW (ref 3.5–5.1)
Sodium: 128 mmol/L — ABNORMAL LOW (ref 135–145)
Total Bilirubin: 0.5 mg/dL (ref 0.3–1.2)
Total Protein: 5.4 g/dL — ABNORMAL LOW (ref 6.5–8.1)

## 2022-04-02 LAB — APTT: aPTT: 44 seconds — ABNORMAL HIGH (ref 24–36)

## 2022-04-02 MED ORDER — SODIUM CHLORIDE 0.9 % IV BOLUS
2000.0000 mL | Freq: Once | INTRAVENOUS | Status: AC
  Administered 2022-04-02: 2000 mL via INTRAVENOUS

## 2022-04-02 MED ORDER — ONDANSETRON 4 MG PO TBDP: ORAL_TABLET | ORAL | 1 refills | Status: DC

## 2022-04-02 MED ORDER — SODIUM CHLORIDE 0.9 % IV SOLN
2.0000 g | INTRAVENOUS | Status: DC
  Administered 2022-04-02: 2 g via INTRAVENOUS
  Filled 2022-04-02: qty 20

## 2022-04-02 MED ORDER — SODIUM CHLORIDE 0.9 % IV SOLN
500.0000 mg | INTRAVENOUS | Status: DC
  Administered 2022-04-02: 500 mg via INTRAVENOUS
  Filled 2022-04-02 (×2): qty 5

## 2022-04-02 NOTE — Sepsis Progress Note (Signed)
eLink is following this Code Sepsis.

## 2022-04-02 NOTE — ED Notes (Signed)
Pt able to keep down PO challenge, stated he feels okay to go home, Dr. Seward Grater

## 2022-04-02 NOTE — ED Provider Notes (Signed)
I have evaluated this patient at the bedside and personally examined him and discussed with he and his wife.  This patient has no signs of fever, he is not tachycardic, his blood pressure is normalized, he has no signs of infection clinically, he did start with a sore throat and chills but that has improved and now he is feeling better with regards to the nausea.  His testing has not shown any signs of anything that needs a specific intervention and he is not currently getting chemo or radiation, he is not neutropenic, his renal function is preserved and he has been given IV fluids and is tolerated a p.o. trial stating that he wants to go home.  He understands indications for return, has Zofran refilled, stable for discharge   Eber Hong, MD 04/02/22 2009

## 2022-04-02 NOTE — ED Notes (Signed)
Both sets of blood cultures drawn before antibiotic administration

## 2022-04-02 NOTE — ED Provider Notes (Signed)
[facility] EMERGENCY DEPARTMENT Provider Note   CSN: [id number] Arrival date & time: 04/02/22  1234     History {Add pertinent medical, surgical, social history, OB history to HPI:1} Chief Complaint  Patient presents with   Emesis   URI    [name] is a 72 y.o. male.  Patient complains of cough fever chills.  Patient has had symptoms for few days.  He was started on antibiotics yesterday..  Patient has a history of pancreatitis  The history is provided by the patient and medical records.  Cough Cough characteristics:  Non-productive Sputum characteristics:  Nondescript Severity:  Moderate Onset quality:  Sudden Timing:  Constant Progression:  Waxing and waning Chronicity:  New Smoker: no   Context: not animal exposure   Relieved by:  Nothing Associated symptoms: no chest pain, no eye discharge, no headaches and no rash        Home Medications Prior to Admission medications   Medication Sig Start Date End Date Taking? Authorizing Provider  apixaban (ELIQUIS) 5 MG TABS tablet Take 1 tablet (5 mg total) by mouth 2 (two) times daily. Restart 01/11/22 01/10/22   Catarina Hartshorn, MD  atorvastatin (LIPITOR) 20 MG tablet Take 20 mg by mouth daily.    [provider]  Bacillus Coagulans-Inulin (PROBIOTIC) 1-250 BILLION-MG CAPS Take 1 capsule by mouth daily.    [provider]  chlorproMAZINE (THORAZINE) 25 MG tablet Take 1 tablet (25 mg total) by mouth every 6 (six) hours as needed. Patient taking differently: Take 25 mg by mouth every 6 (six) hours as needed for nausea or vomiting. 01/01/22   Doreatha Massed, MD  Cholecalciferol (VITAMIN D) 2000 units CAPS Take 2,000 Units by mouth daily.    [provider]  Cholecalciferol (VITAMIN D3) 10 MCG (400 UNIT) tablet Take 400 Units by mouth daily.    [provider]  CREON 36000-114000 units CPEP capsule Take 72,000-108,000 Units by mouth See admin instructions. Take 3 capsules (108,000  units) by mouth with meals and take 2 capsules (72,000 units) with snacks. 11/18/21   [provider]  cyanocobalamin 100 MCG tablet Take by mouth.    [provider]  diclofenac Sodium (VOLTAREN) 1 % GEL Apply 4 g topically 4 (four) times daily. 01/28/22   [provider]  dicyclomine (BENTYL) 10 MG capsule Take by mouth. 01/18/22 01/18/23  [provider]  diphenoxylate-atropine (LOMOTIL) 2.5-0.025 MG tablet Take 1 tablet by mouth 4 times daily 02/04/22   Tiffany Kocher, PA-C  escitalopram (LEXAPRO) 20 MG tablet Take 1 tablet (20 mg total) by mouth daily. 03/08/22   Doreatha Massed, MD  ferrous sulfate 325 (65 FE) MG EC tablet Take by mouth.    [provider]  gabapentin (NEURONTIN) 100 MG capsule TAKE 1 CAPSULE BY MOUTH EVERY DAY AT BEDTIME Patient taking differently: Take 100 mg by mouth at bedtime. 10/22/21   Doreatha Massed, MD  HYDROcodone-acetaminophen (NORCO/VICODIN) 5-325 MG tablet Take 1 tablet by mouth every 8 (eight) hours as needed for moderate pain. 03/17/22   Doreatha Massed, MD  magnesium oxide (MAG-OX) 400 (240 Mg) MG tablet TAKE 1 TABLET BY MOUTH TWICE A DAY 01/22/22   Doreatha Massed, MD  megestrol (MEGACE) 400 MG/10ML suspension Take 10 mLs (400 mg total) by mouth 2 (two) times daily. 02/22/22   Doreatha Massed, MD  mirtazapine (REMERON) 30 MG tablet TAKE 1 TABLET BY MOUTH AT BEDTIME 02/16/22   Doreatha Massed, MD  Multiple Vitamin (MULTIVITAMIN WITH MINERALS) TABS  tablet Take 1 tablet by mouth daily.    [provider]  ondansetron (ZOFRAN-ODT) 4 MG disintegrating tablet TAKE 1 TABLET BY MOUTH EVERY 4 HOURS AS NEEDED FOR NAUSEA AND VOMITING Patient taking differently: Take 4 mg by mouth every 4 (four) hours as needed for vomiting or nausea. 08/22/20   Tiffany Kocher, PA-C  pantoprazole (PROTONIX) 40 MG tablet Take 1 tablet (40 mg total) by mouth 2 (two) times daily before a meal. 06/15/21   Tiffany Kocher, PA-C  PREVALITE 4 g packet Take 1 packet (4 g total) by mouth daily. DO NOT TAKE WITHIN 2 HOURS OF OTHER MEDICATIONS 08/04/21   Tiffany Kocher, PA-C  prochlorperazine (COMPAZINE) 10 MG tablet Take 10 mg by mouth every 6 (six) hours as needed for vomiting or nausea.    [provider]  sucralfate (CARAFATE) 1 GM/10ML suspension TAKE 10 MLS (1 G TOTAL) BY MOUTH 4 (FOUR) TIMES DAILY AS NEEDED. FOR BREAKTHROUGH HEARTBURN Patient taking differently: Take 1 g by mouth 4 (four) times daily as needed (breakthrough heartburn). 10/22/20   Tiffany Kocher, PA-C  traMADol (ULTRAM) 50 MG tablet Take 1 tablet by mouth every 6 (six) hours as needed for moderate pain or severe pain. 09/07/21   [provider]      Allergies    Patient has no known allergies.    Review of Systems   Review of Systems  Constitutional:  Negative for appetite change and fatigue.  HENT:  Negative for congestion, ear discharge and sinus pressure.   Eyes:  Negative for discharge.  Respiratory:  Positive for cough.   Cardiovascular:  Negative for chest pain.  Gastrointestinal:  Negative for abdominal pain and diarrhea.  Genitourinary:  Negative for frequency and hematuria.  Musculoskeletal:  Negative for back pain.  Skin:  Negative for rash.  Neurological:  Negative for seizures and headaches.  Psychiatric/Behavioral:  Negative for hallucinations.     Physical Exam Updated Vital Signs BP 117/87   Pulse 86   Temp 98 F (36.7 C) (Oral)   Resp 20   Ht 5\' 8"  (1.727 m)   Wt 54.4 kg   SpO2 100%   BMI 18.25 kg/m  Physical Exam Vitals and nursing note reviewed.  Constitutional:      Appearance: He is well-developed.  HENT:     Head: Normocephalic.     Nose: Nose normal.  Eyes:     General: No scleral icterus.    Conjunctiva/sclera: Conjunctivae normal.  Neck:     Thyroid: No thyromegaly.  Cardiovascular:     Rate and Rhythm: Normal rate and regular rhythm.     Heart sounds: No murmur  heard.    No friction rub. No gallop.  Pulmonary:     Breath sounds: No stridor. No wheezing or rales.  Chest:     Chest wall: No tenderness.  Abdominal:     General: There is no distension.     Tenderness: There is no abdominal tenderness. There is no rebound.  Musculoskeletal:        General: Normal range of motion.     Cervical back: Neck supple.  Lymphadenopathy:     Cervical: No cervical adenopathy.  Skin:    Findings: No erythema or rash.  Neurological:     Mental Status: He is alert and oriented to person, place, and time.     Motor: No abnormal muscle tone.     Coordination: Coordination normal.  Psychiatric:  Behavior: Behavior normal.     ED Results / Procedures / Treatments   Labs (all labs ordered are listed, but only abnormal results are displayed) Labs Reviewed  RESP PANEL BY RT-PCR (RSV, FLU A&B, COVID)  RVPGX2  CULTURE, BLOOD (ROUTINE X 2)  CULTURE, BLOOD (ROUTINE X 2)  URINE CULTURE  COMPREHENSIVE METABOLIC PANEL  LACTIC ACID, PLASMA  LACTIC ACID, PLASMA  CBC WITH DIFFERENTIAL/PLATELET  PROTIME-INR  URINALYSIS, ROUTINE W REFLEX MICROSCOPIC  APTT    EKG None  Radiology DG Chest Port 1 View  Result Date: 04/02/2022 CLINICAL DATA:  Possible sepsis EXAM: PORTABLE CHEST 1 VIEW COMPARISON:  01/08/2022 FINDINGS: Cardiac size is within normal limits. There is decreased volume in the left lung. Linear densities are seen in left mid and left lower lung fields. Surgical staples are seen in left parahilar region. Left hemidiaphragm is elevated. There is blunting of left lateral CP angle. There is no pneumothorax. Tip of left subclavian chest port is seen in superior vena cava. IMPRESSION: Postsurgical changes are noted in left hemithorax. There are no new infiltrates or signs of pulmonary edema. Electronically Signed   By: Ernie Avena M.D.   On: 04/02/2022 13:21    Procedures Procedures  {Document cardiac monitor, telemetry assessment procedure  when appropriate:1}  Medications Ordered in ED Medications  cefTRIAXone (ROCEPHIN) 2 g in sodium chloride 0.9 % 100 mL IVPB (0 g Intravenous Stopped 04/02/22 1505)  azithromycin (ZITHROMAX) 500 mg in sodium chloride 0.9 % 250 mL IVPB (0 mg Intravenous Stopped 04/02/22 1531)  sodium chloride 0.9 % bolus 2,000 mL (2,000 mLs Intravenous Bolus 04/02/22 1328)    ED Course/ Medical Decision Making/ A&P   {   Click here for ABCD2, HEART and other calculatorsREFRESH Note before signing :1}                          Medical Decision Making Amount and/or Complexity of Data Reviewed Labs: ordered. Radiology: ordered. ECG/medicine tests: ordered.  Patient with pancreatic cancer.  Patient complains of a cough.  Patient was hypotensive initially and sepsis protocol started labs pending and disposition will be determined by Dr. Hyacinth Meeker  {Document critical care time when appropriate:1} {Document review of labs and clinical decision tools ie heart score, Chads2Vasc2 etc:1}  {Document your independent review of radiology images, and any outside records:1} {Document your discussion with family members, caretakers, and with consultants:1} {Document social determinants of health affecting pt's care:1} {Document your decision making why or why not admission, treatments were needed:1} Final Clinical Impression(s) / ED Diagnoses Final diagnoses:  None    Rx / DC Orders ED Discharge Orders     None

## 2022-04-02 NOTE — ED Notes (Signed)
Pt given diet sprite and saltine crackers for PO challenge.

## 2022-04-02 NOTE — ED Notes (Signed)
EDP notified of possible sepsis

## 2022-04-02 NOTE — Discharge Instructions (Signed)
Your testing has been reassuring, thankfully there has been no signs of infections, we have been able to give you some hydration and you have done well, you may take Zofran as needed for nausea  Zofran is a medication which can help with nausea.  You may take 4 mg by mouth every 6 hours as needed if you are an adult, if your child under the age of 6 take half of a tablet or 2 mg every 6 hours as needed.  This should dissolve on your tongue within a short timeframe.  Wait about 30 minutes after taking it to help with drinking clear liquids.  Thank you for allowing [place] to treat you in the emergency department today.  After reviewing your examination and potential testing that was done it appears that you are safe to go home.  I would like for you to follow-up with your doctor within the next several days, have them obtain your results and follow-up with them to review all of these tests.  If you should develop severe or worsening symptoms return to the emergency department immediately

## 2022-04-02 NOTE — ED Triage Notes (Signed)
Pt to ED c/o cold symptoms x 5 days, progressively getting worse. Started on ABX yesterday. Reports generalized weakness, chills, emesis. Pt has hx of pancreatic cancer.

## 2022-04-03 ENCOUNTER — Inpatient Hospital Stay (HOSPITAL_COMMUNITY)
Admission: EM | Admit: 2022-04-03 | Discharge: 2022-04-13 | DRG: 270 | Disposition: A | Discharge status: Home Health | Payer: Medicare Other | Attending: Internal Medicine | Admitting: Internal Medicine

## 2022-04-03 ENCOUNTER — Encounter (HOSPITAL_COMMUNITY): Payer: Self-pay | Admitting: Family Medicine

## 2022-04-03 ENCOUNTER — Other Ambulatory Visit: Payer: Self-pay

## 2022-04-03 DIAGNOSIS — E876 Hypokalemia: Secondary | ICD-10-CM | POA: Diagnosis present

## 2022-04-03 DIAGNOSIS — Z8249 Family history of ischemic heart disease and other diseases of the circulatory system: Secondary | ICD-10-CM | POA: Diagnosis not present

## 2022-04-03 DIAGNOSIS — A408 Other streptococcal sepsis: Secondary | ICD-10-CM | POA: Diagnosis present

## 2022-04-03 DIAGNOSIS — T451X5A Adverse effect of antineoplastic and immunosuppressive drugs, initial encounter: Secondary | ICD-10-CM | POA: Diagnosis present

## 2022-04-03 DIAGNOSIS — R64 Cachexia: Secondary | ICD-10-CM | POA: Diagnosis present

## 2022-04-03 DIAGNOSIS — K921 Melena: Secondary | ICD-10-CM | POA: Diagnosis not present

## 2022-04-03 DIAGNOSIS — D638 Anemia in other chronic diseases classified elsewhere: Secondary | ICD-10-CM | POA: Diagnosis present

## 2022-04-03 DIAGNOSIS — Z87891 Personal history of nicotine dependence: Secondary | ICD-10-CM | POA: Diagnosis not present

## 2022-04-03 DIAGNOSIS — N1831 Chronic kidney disease, stage 3a: Secondary | ICD-10-CM | POA: Diagnosis present

## 2022-04-03 DIAGNOSIS — Z7901 Long term (current) use of anticoagulants: Secondary | ICD-10-CM | POA: Diagnosis not present

## 2022-04-03 DIAGNOSIS — Z903 Acquired absence of stomach [part of]: Secondary | ICD-10-CM | POA: Diagnosis not present

## 2022-04-03 DIAGNOSIS — M1990 Unspecified osteoarthritis, unspecified site: Secondary | ICD-10-CM | POA: Diagnosis present

## 2022-04-03 DIAGNOSIS — T80211A Bloodstream infection due to central venous catheter, initial encounter: Secondary | ICD-10-CM | POA: Diagnosis present

## 2022-04-03 DIAGNOSIS — K2100 Gastro-esophageal reflux disease with esophagitis, without bleeding: Secondary | ICD-10-CM | POA: Diagnosis present

## 2022-04-03 DIAGNOSIS — E869 Volume depletion, unspecified: Secondary | ICD-10-CM | POA: Diagnosis present

## 2022-04-03 DIAGNOSIS — Z66 Do not resuscitate: Secondary | ICD-10-CM | POA: Diagnosis not present

## 2022-04-03 DIAGNOSIS — E871 Hypo-osmolality and hyponatremia: Secondary | ICD-10-CM | POA: Diagnosis present

## 2022-04-03 DIAGNOSIS — Z85118 Personal history of other malignant neoplasm of bronchus and lung: Secondary | ICD-10-CM

## 2022-04-03 DIAGNOSIS — N189 Chronic kidney disease, unspecified: Secondary | ICD-10-CM | POA: Diagnosis not present

## 2022-04-03 DIAGNOSIS — K912 Postsurgical malabsorption, not elsewhere classified: Secondary | ICD-10-CM | POA: Diagnosis present

## 2022-04-03 DIAGNOSIS — R195 Other fecal abnormalities: Secondary | ICD-10-CM | POA: Diagnosis not present

## 2022-04-03 DIAGNOSIS — N179 Acute kidney failure, unspecified: Secondary | ICD-10-CM | POA: Diagnosis not present

## 2022-04-03 DIAGNOSIS — E538 Deficiency of other specified B group vitamins: Secondary | ICD-10-CM | POA: Diagnosis present

## 2022-04-03 DIAGNOSIS — C252 Malignant neoplasm of tail of pancreas: Secondary | ICD-10-CM | POA: Diagnosis not present

## 2022-04-03 DIAGNOSIS — M810 Age-related osteoporosis without current pathological fracture: Secondary | ICD-10-CM | POA: Diagnosis present

## 2022-04-03 DIAGNOSIS — I129 Hypertensive chronic kidney disease with stage 1 through stage 4 chronic kidney disease, or unspecified chronic kidney disease: Secondary | ICD-10-CM | POA: Diagnosis present

## 2022-04-03 DIAGNOSIS — Z79899 Other long term (current) drug therapy: Secondary | ICD-10-CM

## 2022-04-03 DIAGNOSIS — R509 Fever, unspecified: Secondary | ICD-10-CM | POA: Diagnosis not present

## 2022-04-03 DIAGNOSIS — R7881 Bacteremia: Secondary | ICD-10-CM | POA: Diagnosis not present

## 2022-04-03 DIAGNOSIS — Z85038 Personal history of other malignant neoplasm of large intestine: Secondary | ICD-10-CM

## 2022-04-03 DIAGNOSIS — Z681 Body mass index (BMI) 19 or less, adult: Secondary | ICD-10-CM | POA: Diagnosis not present

## 2022-04-03 DIAGNOSIS — I10 Essential (primary) hypertension: Secondary | ICD-10-CM | POA: Diagnosis not present

## 2022-04-03 DIAGNOSIS — G8929 Other chronic pain: Secondary | ICD-10-CM | POA: Diagnosis present

## 2022-04-03 DIAGNOSIS — Z86711 Personal history of pulmonary embolism: Secondary | ICD-10-CM | POA: Diagnosis not present

## 2022-04-03 DIAGNOSIS — Z9221 Personal history of antineoplastic chemotherapy: Secondary | ICD-10-CM

## 2022-04-03 DIAGNOSIS — Z8673 Personal history of transient ischemic attack (TIA), and cerebral infarction without residual deficits: Secondary | ICD-10-CM

## 2022-04-03 DIAGNOSIS — B957 Other staphylococcus as the cause of diseases classified elsewhere: Secondary | ICD-10-CM | POA: Diagnosis present

## 2022-04-03 DIAGNOSIS — Z8507 Personal history of malignant neoplasm of pancreas: Secondary | ICD-10-CM

## 2022-04-03 DIAGNOSIS — C259 Malignant neoplasm of pancreas, unspecified: Secondary | ICD-10-CM | POA: Diagnosis present

## 2022-04-03 DIAGNOSIS — Z1152 Encounter for screening for COVID-19: Secondary | ICD-10-CM

## 2022-04-03 DIAGNOSIS — E785 Hyperlipidemia, unspecified: Secondary | ICD-10-CM | POA: Diagnosis present

## 2022-04-03 DIAGNOSIS — D500 Iron deficiency anemia secondary to blood loss (chronic): Secondary | ICD-10-CM | POA: Diagnosis present

## 2022-04-03 DIAGNOSIS — D61810 Antineoplastic chemotherapy induced pancytopenia: Secondary | ICD-10-CM | POA: Diagnosis present

## 2022-04-03 DIAGNOSIS — Z9049 Acquired absence of other specified parts of digestive tract: Secondary | ICD-10-CM

## 2022-04-03 DIAGNOSIS — C189 Malignant neoplasm of colon, unspecified: Secondary | ICD-10-CM | POA: Diagnosis present

## 2022-04-03 DIAGNOSIS — Z8261 Family history of arthritis: Secondary | ICD-10-CM

## 2022-04-03 DIAGNOSIS — N183 Chronic kidney disease, stage 3 unspecified: Secondary | ICD-10-CM | POA: Diagnosis present

## 2022-04-03 DIAGNOSIS — K2210 Ulcer of esophagus without bleeding: Secondary | ICD-10-CM | POA: Diagnosis not present

## 2022-04-03 LAB — BLOOD CULTURE ID PANEL (REFLEXED) - BCID2

## 2022-04-03 LAB — CBC WITH DIFFERENTIAL/PLATELET
Abs Immature Granulocytes: 0.03 10*3/uL (ref 0.00–0.07)
Basophils Absolute: 0 10*3/uL (ref 0.0–0.1)
Basophils Relative: 0 %
Eosinophils Absolute: 0 10*3/uL (ref 0.0–0.5)
Eosinophils Relative: 1 %
HCT: 23.3 % — ABNORMAL LOW (ref 39.0–52.0)
Hemoglobin: 7.7 g/dL — ABNORMAL LOW (ref 13.0–17.0)
Immature Granulocytes: 1 %
Lymphocytes Relative: 13 %
Lymphs Abs: 0.5 10*3/uL — ABNORMAL LOW (ref 0.7–4.0)
MCH: 30.7 pg (ref 26.0–34.0)
MCHC: 33 g/dL (ref 30.0–36.0)
MCV: 92.8 fL (ref 80.0–100.0)
Monocytes Absolute: 0.4 10*3/uL (ref 0.1–1.0)
Monocytes Relative: 10 %
Neutro Abs: 3.2 10*3/uL (ref 1.7–7.7)
Neutrophils Relative %: 75 %
Platelets: 74 10*3/uL — ABNORMAL LOW (ref 150–400)
RBC: 2.51 MIL/uL — ABNORMAL LOW (ref 4.22–5.81)
RDW: 15.1 % (ref 11.5–15.5)
WBC: 4.2 10*3/uL (ref 4.0–10.5)
nRBC: 0 % (ref 0.0–0.2)

## 2022-04-03 LAB — BASIC METABOLIC PANEL
Anion gap: 6 (ref 5–15)
BUN: 15 mg/dL (ref 8–23)
CO2: 22 mmol/L (ref 22–32)
Calcium: 6.8 mg/dL — ABNORMAL LOW (ref 8.9–10.3)
Chloride: 102 mmol/L (ref 98–111)
Creatinine, Ser: 1.09 mg/dL (ref 0.61–1.24)
GFR, Estimated: >60 mL/min (ref >60)
Glucose, Bld: 94 mg/dL (ref 70–99)
Potassium: 3 mmol/L — ABNORMAL LOW (ref 3.5–5.1)
Sodium: 130 mmol/L — ABNORMAL LOW (ref 135–145)

## 2022-04-03 LAB — LACTIC ACID, PLASMA
Lactic Acid, Venous: 0.9 mmol/L (ref 0.5–1.9)
Lactic Acid, Venous: 1.6 mmol/L (ref 0.5–1.9)

## 2022-04-03 MED ORDER — RISAQUAD PO CAPS
1.0000 | ORAL_CAPSULE | Freq: Every day | ORAL | Status: DC
  Administered 2022-04-04 – 2022-04-13 (×8): 1 via ORAL
  Filled 2022-04-03 (×10): qty 1

## 2022-04-03 MED ORDER — VANCOMYCIN HCL 750 MG/150ML IV SOLN
750.0000 mg | INTRAVENOUS | Status: DC
  Administered 2022-04-04 – 2022-04-07 (×4): 750 mg via INTRAVENOUS
  Filled 2022-04-03 (×4): qty 150

## 2022-04-03 MED ORDER — CHOLESTYRAMINE LIGHT 4 G PO PACK
4.0000 g | PACK | Freq: Every day | ORAL | Status: DC
  Administered 2022-04-04 – 2022-04-13 (×7): 4 g via ORAL
  Filled 2022-04-03 (×11): qty 1

## 2022-04-03 MED ORDER — SODIUM CHLORIDE 0.9% IV SOLUTION: Freq: Once | INTRAVENOUS | Status: AC

## 2022-04-03 MED ORDER — DIPHENHYDRAMINE HCL 25 MG PO CAPS
25.0000 mg | ORAL_CAPSULE | Freq: Once | ORAL | Status: AC
  Administered 2022-04-04: 25 mg via ORAL
  Filled 2022-04-03: qty 1

## 2022-04-03 MED ORDER — PANCRELIPASE (LIP-PROT-AMYL) 36000-114000 UNITS PO CPEP
108000.0000 [IU] | ORAL_CAPSULE | Freq: Three times a day (TID) | ORAL | Status: DC
  Administered 2022-04-04 – 2022-04-13 (×25): 108000 [IU] via ORAL
  Filled 2022-04-03 (×3): qty 3
  Filled 2022-04-03: qty 9
  Filled 2022-04-03 (×21): qty 3

## 2022-04-03 MED ORDER — POLYETHYLENE GLYCOL 3350 17 G PO PACK: 17.0000 g | PACK | Freq: Every day | ORAL | Status: DC | PRN

## 2022-04-03 MED ORDER — PANCRELIPASE (LIP-PROT-AMYL) 36000-114000 UNITS PO CPEP: 72000.0000 [IU] | ORAL_CAPSULE | Freq: Two times a day (BID) | ORAL | Status: DC | PRN

## 2022-04-03 MED ORDER — PROCHLORPERAZINE MALEATE 5 MG PO TABS
10.0000 mg | ORAL_TABLET | Freq: Four times a day (QID) | ORAL | Status: DC | PRN
  Administered 2022-04-04 – 2022-04-13 (×6): 10 mg via ORAL
  Filled 2022-04-03 (×6): qty 2

## 2022-04-03 MED ORDER — ONDANSETRON 4 MG PO TBDP
4.0000 mg | ORAL_TABLET | Freq: Once | ORAL | Status: AC
  Administered 2022-04-03: 4 mg via ORAL
  Filled 2022-04-03: qty 1

## 2022-04-03 MED ORDER — ATORVASTATIN CALCIUM 20 MG PO TABS
20.0000 mg | ORAL_TABLET | Freq: Every day | ORAL | Status: DC
  Administered 2022-04-04 – 2022-04-13 (×8): 20 mg via ORAL
  Filled 2022-04-03 (×10): qty 1

## 2022-04-03 MED ORDER — VANCOMYCIN HCL IN DEXTROSE 1-5 GM/200ML-% IV SOLN
1000.0000 mg | Freq: Once | INTRAVENOUS | Status: AC
  Administered 2022-04-03: 1000 mg via INTRAVENOUS
  Filled 2022-04-03: qty 200

## 2022-04-03 MED ORDER — MIRTAZAPINE 30 MG PO TABS
30.0000 mg | ORAL_TABLET | Freq: Every day | ORAL | Status: DC
  Administered 2022-04-04 – 2022-04-12 (×10): 30 mg via ORAL
  Filled 2022-04-03 (×10): qty 1

## 2022-04-03 MED ORDER — ONDANSETRON HCL 4 MG PO TABS
4.0000 mg | ORAL_TABLET | Freq: Four times a day (QID) | ORAL | Status: DC | PRN
  Administered 2022-04-04 – 2022-04-05 (×2): 4 mg via ORAL
  Filled 2022-04-03 (×2): qty 1

## 2022-04-03 MED ORDER — ACETAMINOPHEN 325 MG PO TABS
650.0000 mg | ORAL_TABLET | Freq: Once | ORAL | Status: AC
  Administered 2022-04-04: 650 mg via ORAL
  Filled 2022-04-03: qty 2

## 2022-04-03 MED ORDER — GABAPENTIN 100 MG PO CAPS
100.0000 mg | ORAL_CAPSULE | Freq: Every day | ORAL | Status: DC
  Administered 2022-04-04 – 2022-04-12 (×10): 100 mg via ORAL
  Filled 2022-04-03 (×10): qty 1

## 2022-04-03 MED ORDER — APIXABAN 5 MG PO TABS
5.0000 mg | ORAL_TABLET | Freq: Two times a day (BID) | ORAL | Status: DC
  Administered 2022-04-04 (×2): 5 mg via ORAL
  Filled 2022-04-03 (×2): qty 1

## 2022-04-03 MED ORDER — ESCITALOPRAM OXALATE 10 MG PO TABS
20.0000 mg | ORAL_TABLET | Freq: Every day | ORAL | Status: DC
  Administered 2022-04-04 – 2022-04-13 (×8): 20 mg via ORAL
  Filled 2022-04-03 (×11): qty 2

## 2022-04-03 MED ORDER — PANTOPRAZOLE SODIUM 40 MG PO TBEC
40.0000 mg | DELAYED_RELEASE_TABLET | Freq: Two times a day (BID) | ORAL | Status: DC
  Administered 2022-04-04 – 2022-04-13 (×18): 40 mg via ORAL
  Filled 2022-04-03 (×19): qty 1

## 2022-04-03 MED ORDER — ENSURE MAX PROTEIN PO LIQD
11.0000 [oz_av] | Freq: Two times a day (BID) | ORAL | Status: DC
  Administered 2022-04-04 – 2022-04-05 (×2): 11 [oz_av] via ORAL

## 2022-04-03 MED ORDER — DIPHENOXYLATE-ATROPINE 2.5-0.025 MG PO TABS
1.0000 | ORAL_TABLET | Freq: Four times a day (QID) | ORAL | Status: DC
  Administered 2022-04-04 – 2022-04-13 (×34): 1 via ORAL
  Filled 2022-04-03 (×35): qty 1

## 2022-04-03 MED ORDER — MEGESTROL ACETATE 400 MG/10ML PO SUSP
400.0000 mg | Freq: Two times a day (BID) | ORAL | Status: DC
  Administered 2022-04-04 – 2022-04-13 (×18): 400 mg via ORAL
  Filled 2022-04-03 (×20): qty 10

## 2022-04-03 MED ORDER — DICYCLOMINE HCL 10 MG PO CAPS
10.0000 mg | ORAL_CAPSULE | Freq: Three times a day (TID) | ORAL | Status: DC
  Administered 2022-04-04 – 2022-04-13 (×34): 10 mg via ORAL
  Filled 2022-04-03 (×34): qty 1

## 2022-04-03 MED ORDER — HYDROCODONE-ACETAMINOPHEN 5-325 MG PO TABS
1.0000 | ORAL_TABLET | Freq: Three times a day (TID) | ORAL | Status: DC | PRN
  Administered 2022-04-04 – 2022-04-13 (×17): 1 via ORAL
  Filled 2022-04-03 (×19): qty 1

## 2022-04-03 MED ORDER — ONDANSETRON HCL 4 MG/2ML IJ SOLN
4.0000 mg | Freq: Four times a day (QID) | INTRAMUSCULAR | Status: DC | PRN
  Administered 2022-04-08 – 2022-04-13 (×12): 4 mg via INTRAVENOUS
  Filled 2022-04-03 (×13): qty 2

## 2022-04-03 NOTE — ED Notes (Signed)
Pt ambulated to BR, steady gait noted 

## 2022-04-03 NOTE — Progress Notes (Signed)
Pharmacy Antibiotic Note  [name] is a 72 y.o. male admitted on 04/03/2022 with bacteremia.  Patient has Bcx positive from 04/02/22 growing 3/4 MRSE. Patient on TPN prior to admission. Pharmacy has been consulted for vancomycin dosing. WBC 4.2, LA 1.6, afeb.   Plan: Vancomycin 1000mg  IV x 1 per MD as load  Start vancomycin 750mg  IV q 24h  -AUC goal 400-550; predicted AUC 440 -vanc levels PRN per protocol  -f/u renal function, LOT    Height: 5\' 8"  (172.7 cm) Weight: 54.4 kg (120 lb) IBW/kg (Calculated) : 68.4  Temp (24hrs), Avg:98.5 F (36.9 C), Min:97.5 F (36.4 C), Max:99.8 F (37.7 C)  Recent Labs  Lab 04/02/22 1401 04/03/22 1623 04/03/22 1710 04/03/22 1814  WBC 3.5* 4.2  --   --   CREATININE 1.27* 1.09  --   --   LATICACIDVEN 1.5  --  0.9 1.6    Estimated Creatinine Clearance: 47.8 mL/min (by C-G formula based on SCr of 1.09 mg/dL).    No Known Allergies  Antimicrobials this admission: Vancomycin 1/13>   Dose adjustments this admission:   Microbiology results: 1/12 BCx: MRSE  1/13 Bcx: pending 1/13 Ucx: sent  Thank you for allowing pharmacy to be a part of this patient's care.  3/12, PharmD Clinical Pharmacist 04/03/2022 7:20 PM

## 2022-04-03 NOTE — ED Notes (Signed)
Pt refused gown.

## 2022-04-03 NOTE — ED Notes (Signed)
Attempted to call report as pt has an updated bed "ready status" to bed 328, nurse unaware of inpatient bed status, secretary will advised receiving RN to call back for report

## 2022-04-03 NOTE — ED Notes (Signed)
Attempted to call report to inpatient unit 300, bed status showing "bed ready" for inpatient admission, however, bed is not "technically" ready or has been approved. This Rn will attempted to call report once bed is in "ready status".

## 2022-04-03 NOTE — ED Notes (Signed)
Pt called and advised to return back to ED for further evaluation due to positive blood cultures. Dr. Charm Barges made aware of critical results and to notify pt to return.

## 2022-04-03 NOTE — ED Notes (Signed)
Receiving RN Toni Amend has agreed to accept [facility] once pt has arrived to inpatient unit, all questions and concerns address.

## 2022-04-03 NOTE — ED Triage Notes (Signed)
Pt to ED c/o abnormal labs, pt received call from hospital stating blood cultures positive. Pt was in ED yesterday.

## 2022-04-03 NOTE — ED Notes (Signed)
Pt eating crackers and cheese provided by his wife at this time. Pt given ginger ale, wife hooking up pt's continuous TPN at this time so that it does run empty.

## 2022-04-03 NOTE — ED Provider Notes (Signed)
[facility] Provider Note   CSN: [id number] Arrival date & time: 04/03/22  1603     History  Chief Complaint  Patient presents with   Abnormal Lab    [name] is a 72 y.o. male.   Abnormal Lab    Patient is a 72 year old male, he is on Eliquis, he has a history of pancreatic cancer, presented yesterday with coughing as well as some nausea and vomiting and some chronic diarrhea.  He was initially given 2 g of Rocephin and Zithromax to cover for possible pneumonia as well as some IV fluids.  At the end of his visit last night he was doing well, tolerating p.o., no hypotension no tachycardia no fever.  His labs have been reviewed and seem to be unremarkable compared to baseline.  He had a blood culture that came back positive and he was sent in for repeat evaluation.  Blood culture showed Staph epidermidis and possible MRSA  Home Medications Prior to Admission medications   Medication Sig Start Date End Date Taking? Authorizing Provider  apixaban (ELIQUIS) 5 MG TABS tablet Take 1 tablet (5 mg total) by mouth 2 (two) times daily. Restart 01/11/22 01/10/22  Yes Tat, Shanon Brow, MD  atorvastatin (LIPITOR) 20 MG tablet Take 20 mg by mouth daily.   Yes [provider]  azithromycin (ZITHROMAX) 250 MG tablet Take 250 mg by mouth as directed. 03/31/22  Yes [provider]  Bacillus Coagulans-Inulin (PROBIOTIC) 1-250 BILLION-MG CAPS Take 1 capsule by mouth daily.   Yes [provider]  chlorproMAZINE (THORAZINE) 25 MG tablet Take 1 tablet (25 mg total) by mouth every 6 (six) hours as needed. Patient taking differently: Take 25 mg by mouth every 6 (six) hours as needed for nausea or vomiting. 01/01/22  Yes Derek Jack, MD  Cholecalciferol (VITAMIN D) 2000 units CAPS Take 2,000 Units by mouth daily.   Yes [provider]  Cholecalciferol (VITAMIN D3) 10 MCG (400 UNIT) tablet Take 400 Units by mouth daily.   Yes [provider]  CREON 36000-114000 units CPEP capsule Take 72,000-108,000 Units by mouth See admin instructions. Take 3 capsules (108,000 units) by mouth with meals and take 2 capsules (72,000 units) with snacks. 11/18/21  Yes [provider]  cyanocobalamin 100 MCG tablet Take by mouth.   Yes [provider]  diclofenac Sodium (VOLTAREN) 1 % GEL Apply 4 g topically 4 (four) times daily. 01/28/22  Yes [provider]  dicyclomine (BENTYL) 10 MG capsule Take 10 mg by mouth 2 (two) times daily before a meal. 01/18/22 01/18/23 Yes [provider]  diphenoxylate-atropine (LOMOTIL) 2.5-0.025 MG tablet Take 1 tablet by mouth 4 times daily 02/04/22  Yes Mahala Menghini, PA-C  escitalopram (LEXAPRO) 20 MG tablet Take 1 tablet (20 mg total) by mouth daily. 03/08/22  Yes Derek Jack, MD  ferrous sulfate 325 (65 FE) MG EC tablet Take by mouth.   Yes [provider]  gabapentin (NEURONTIN) 100 MG capsule TAKE 1 CAPSULE BY MOUTH EVERY DAY AT BEDTIME Patient taking differently: Take 100 mg by mouth at bedtime. 10/22/21  Yes Derek Jack, MD  HYDROcodone-acetaminophen (NORCO/VICODIN) 5-325 MG tablet Take 1 tablet by mouth every 8 (eight) hours as needed for moderate pain. 03/17/22  Yes Derek Jack, MD  hydrocortisone cream 1 % Apply 1 Application topically 2 (two) times daily.   Yes [provider]  magnesium oxide (MAG-OX) 400 (240 Mg) MG tablet TAKE 1 TABLET BY MOUTH TWICE A  DAY 01/22/22  Yes Doreatha Massed, MD  megestrol (MEGACE) 400 MG/10ML suspension Take 10 mLs (400 mg total) by mouth 2 (two) times daily. 02/22/22  Yes Doreatha Massed, MD  mirtazapine (REMERON) 30 MG tablet TAKE 1 TABLET BY MOUTH AT BEDTIME 02/16/22  Yes Doreatha Massed, MD  Multiple Vitamin (INFUVITE ADULT IV) 5 mLs See admin instructions. Inject 5 mls from each vial #1 and #2   Yes [provider]  Multiple Vitamin (MULTIVITAMIN WITH MINERALS)  TABS tablet Take 1 tablet by mouth daily.   Yes [provider]  ondansetron (ZOFRAN-ODT) 4 MG disintegrating tablet TAKE 1 TABLET BY MOUTH EVERY 4 HOURS AS NEEDED FOR NAUSEA AND VOMITING 04/02/22  Yes Eber Hong, MD  pantoprazole (PROTONIX) 40 MG tablet Take 1 tablet (40 mg total) by mouth 2 (two) times daily before a meal. 06/15/21  Yes Tiffany Kocher, PA-C  PREVALITE 4 g packet Take 1 packet (4 g total) by mouth daily. DO NOT TAKE WITHIN 2 HOURS OF OTHER MEDICATIONS 08/04/21  Yes Tiffany Kocher, PA-C  prochlorperazine (COMPAZINE) 10 MG tablet Take 10 mg by mouth every 6 (six) hours as needed for vomiting or nausea.   Yes [provider]  sterile water SOLN with Travasol 10 % SOLN 50 g/L, dextrose 70 % SOLN 20 %, sodium INJ 35 mEq/L, potassium INJ 30 mEq/L, calcium GLUCONATE INJ 4.7 mEq/L, magnesium SULFATE INJ 5 mEq/L, phosphorus INJ 15 mmol/L, M.V.I. Adult INJ 10 mL, trace elements Cr-Cu-Mn-Se-Zn (MTE-5 Concentrate) 12-998-500-60 MCG/ML SOLN 1 mL, fat emulsion 30 % EMUL 100 mL/L continuous.   Yes [provider]  sucralfate (CARAFATE) 1 GM/10ML suspension TAKE 10 MLS (1 G TOTAL) BY MOUTH 4 (FOUR) TIMES DAILY AS NEEDED. FOR BREAKTHROUGH HEARTBURN Patient taking differently: Take 1 g by mouth 4 (four) times daily as needed (breakthrough heartburn). 10/22/20  Yes Tiffany Kocher, PA-C  traMADol (ULTRAM) 50 MG tablet Take 1 tablet by mouth every 6 (six) hours as needed for moderate pain or severe pain. 09/07/21  Yes [provider]      Allergies    Patient has no known allergies.    Review of Systems   Review of Systems  All other systems reviewed and are negative.   Physical Exam Updated Vital Signs BP (!) 103/49   Pulse 86   Temp 99.8 F (37.7 C) (Oral)   Resp 17   Ht 1.727 m (5\' 8" )   Wt 54.4 kg   SpO2 96%   BMI 18.25 kg/m  Physical Exam Vitals and nursing note reviewed.  Constitutional:      General: He is not in acute distress.     Appearance: He is well-developed.  HENT:     Head: Normocephalic and atraumatic.     Mouth/Throat:     Pharynx: No oropharyngeal exudate.  Eyes:     General: No scleral icterus.       Right eye: No discharge.        Left eye: No discharge.     Conjunctiva/sclera: Conjunctivae normal.     Pupils: Pupils are equal, round, and reactive to light.  Neck:     Thyroid: No thyromegaly.     Vascular: No JVD.  Cardiovascular:     Rate and Rhythm: Normal rate and regular rhythm.     Heart sounds: Normal heart sounds. No murmur heard.    No friction rub. No gallop.     Comments: TPN going and the port in the left upper  chest Pulmonary:     Effort: Pulmonary effort is normal. No respiratory distress.     Breath sounds: Normal breath sounds. No wheezing or rales.  Abdominal:     General: Bowel sounds are normal. There is no distension.     Palpations: Abdomen is soft. There is no mass.     Tenderness: There is no abdominal tenderness.  Musculoskeletal:        General: No tenderness. Normal range of motion.     Cervical back: Normal range of motion and neck supple.     Right lower leg: No edema.     Left lower leg: No edema.  Lymphadenopathy:     Cervical: No cervical adenopathy.  Skin:    General: Skin is warm and dry.     Findings: No erythema or rash.  Neurological:     Mental Status: He is alert.     Coordination: Coordination normal.  Psychiatric:        Behavior: Behavior normal.     ED Results / Procedures / Treatments   Labs (all labs ordered are listed, but only abnormal results are displayed) Labs Reviewed  CBC WITH DIFFERENTIAL/PLATELET - Abnormal; Notable for the following components:      Result Value   RBC 2.51 (*)    Hemoglobin 7.7 (*)    HCT 23.3 (*)    Platelets 74 (*)    Lymphs Abs 0.5 (*)    All other components within normal limits  BASIC METABOLIC PANEL - Abnormal; Notable for the following components:   Sodium 130 (*)    Potassium 3.0 (*)    Calcium  6.8 (*)    All other components within normal limits  CULTURE, BLOOD (ROUTINE X 2)  CULTURE, BLOOD (ROUTINE X 2)  LACTIC ACID, PLASMA  LACTIC ACID, PLASMA  COMPREHENSIVE METABOLIC PANEL  MAGNESIUM  PHOSPHORUS  TRIGLYCERIDES  POC OCCULT BLOOD, ED    EKG None  Radiology DG Chest Port 1 View  Result Date: 04/02/2022 CLINICAL DATA:  Possible sepsis EXAM: PORTABLE CHEST 1 VIEW COMPARISON:  01/08/2022 FINDINGS: Cardiac size is within normal limits. There is decreased volume in the left lung. Linear densities are seen in left mid and left lower lung fields. Surgical staples are seen in left parahilar region. Left hemidiaphragm is elevated. There is blunting of left lateral CP angle. There is no pneumothorax. Tip of left subclavian chest port is seen in superior vena cava. IMPRESSION: Postsurgical changes are noted in left hemithorax. There are no new infiltrates or signs of pulmonary edema. Electronically Signed   By: Ernie Avena M.D.   On: 04/02/2022 13:21    Procedures Procedures    Medications Ordered in ED Medications  vancomycin (VANCOREADY) IVPB 750 mg/150 mL (has no administration in time range)  ondansetron (ZOFRAN-ODT) disintegrating tablet 4 mg (4 mg Oral Given 04/03/22 1719)  vancomycin (VANCOCIN) IVPB 1000 mg/200 mL premix (0 mg Intravenous Stopped 04/03/22 2016)    ED Course/ Medical Decision Making/ A&P                             Medical Decision Making Amount and/or Complexity of Data Reviewed Labs: ordered.  Risk Prescription drug management. Decision regarding hospitalization.   This patient is in no distress, he is well-appearing, answering questions appropriately, vital signs are unremarkable with a pulse of 82 and a blood pressure of 112/69.  He is afebrile, I will recheck labs and cultures.  Will discuss with lab regarding micro results.  MRSA confirmed on culture with lab / micro  VS here stable  Needs admit for observation - vanc  ordered  D/w Dr. Adrian Blackwater as a consultant who will admit the patient to the hospital.  Appreciate his willingness to help        Final Clinical Impression(s) / ED Diagnoses Final diagnoses:  Bacteremia     Eber Hong, MD 04/03/22 2231

## 2022-04-03 NOTE — H&P (Signed)
History and Physical    Patient: [name] VHH:[id number] DOB: 1950/11/15 DOA: 04/03/2022 DOS: the patient was seen and examined on 04/03/2022 PCP: Romeo Rabon, MD  Patient coming from: Home  Chief Complaint:  Chief Complaint  Patient presents with   Abnormal Lab   HPI: [name] is a 72 y.o. male with medical history significant of stage III adenocarcinoma, GERD, hypertension, partial gastrectomy, pancreatic cancer, history of PE on anticoagulation's, chronic anemia.  Patient has been having 4 days of fevers with chills and rigors.  Her worst days were the first 2 days that have been getting better over the past 2 days.  He did present to the hospital yesterday for evaluation.  He had blood cultures, then ended up going home.  He still feels about the same as he did yesterday: Nausea with some intermittent fevers and chills.  He does still feel somewhat weak.  He did have a positive blood culture: One of his cultures came back positive for Staph epidermidis, the other was positive for MRSA.  He came back in because of the positive blood cultures.  Review of Systems: As mentioned in the history of present illness. All other systems reviewed and are negative. 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   Stage 3   Anemia    Anxiety    Arthritis    Barrett's esophagus    Blood transfusion    Bowel obstruction (HCC) 05/13/2012   Recurrent   Bronchitis    Chest pain at rest    Chronic abdominal pain    Erosive esophagitis    ETOH abuse    quit 03/2010   GERD (gastroesophageal reflux disease)    Hx of Clostridium difficile infection 01/2012   Hypertension    Ileus (HCC)    Iron deficiency anemia 03/23/2016   Lung cancer (HCC)    Obstruction of bowel (HCC) 03/03/2014   Osteoporosis    Pancreatic cancer (HCC)    Personal history of PE (pulmonary embolism) 10/01/2010   Pneumonia    Pulmonary embolism (HCC) 02/2010    Recurrent upper respiratory infection (URI)    Renal disorder    S/P endoscopy 09/28/2010   erosive reflux esophagitis, Billroth I anatomy   S/P partial gastrectomy 1980s   Seizures (HCC)    was on meds for 6 months, Unknown etiology, last seizure was 2017.   Shortness of breath    TIA (transient ischemic attack) 12/2009   Vitamin B12 deficiency    Past Surgical History:  Procedure Laterality Date   ABDOMINAL ADHESION SURGERY  03/04/15   @ [facility]   ABDOMINAL EXPLORATION SURGERY     abdominal sugery     for bowel obstruction x 8, all in 1980s, except for one in 07/2010   APPENDECTOMY  1980s   Billroth 1 hemigastrectomy  1980s   per patient for benign duodenal tumor   BIOPSY  01/10/2022   Procedure: BIOPSY;  Surgeon: Lanelle Bal, DO;  [facility];  Service: Endoscopy;;  gastric,ileum   CARDIAC CATHETERIZATION  07/17/2012   CHOLECYSTECTOMY  1980s   COLON SURGERY  May 2012   left hemicolectomy, colon cancer found at time of surgery for bowel obstruction   COLON SURGERY  10/12/2018   At [facility]: Resection of right colon and terminal ileum with creation of ileorectal anastomosis for large bowel obstruction.  Cecum and ascending colon noted to be attached to the rectum.   COLONOSCOPY  03/18/2011   anastomosis at 35cm. Several adenomatous polyps removed. Sigmoid diverticulosis. Next TCS 02/2013   COLONOSCOPY N/A 07/24/2012   EWO:AIRQTM post segmental resection with normal-appearing colonic anastomosis aside from an adjacent polyp-removed as described above. Rectal polyp-removed as described above. CT findings appear to have been artifactual. tubular adenomas/prolapsed type polyp.   COLONOSCOPY N/A 05/15/2015   Procedure: COLONOSCOPY;  Surgeon: Corbin Ade, MD;  [facility];  Service: Endoscopy;  Laterality: N/A;   COLONOSCOPY  09/26/2018   at [facility]: prep not adequate for colon cancer surveillance.  Prior end-to-end colonic anastomosis in the rectosigmoid region.   This was patent and characterized by healthy-appearing mucosa.  Anastomosis was traversed.  Normal terminal ileum.   COLONOSCOPY WITH PROPOFOL N/A 11/04/2016   Dr. Jena Gauss, status post subtotal colectomy with normal-appearing residual lower GI tract.  Next colonoscopy in 5 years.   ESOPHAGOGASTRODUODENOSCOPY  09/28/2010   ESOPHAGOGASTRODUODENOSCOPY  12/01/2010   Cervical web status post dilation, erosive esophagitis, B1 hemigastrectomy, inflamed anastomosis   ESOPHAGOGASTRODUODENOSCOPY  04/16/2011   excoriation at [facility] c/w trauma/M-W tear, friable gastric anastomosis, dilation efferent limb   ESOPHAGOGASTRODUODENOSCOPY N/A 06/03/2014   Dr.[name]- cervcal esopphageal web s/p dilation. abnormal distal esophagus bx= barretts esophagus   ESOPHAGOGASTRODUODENOSCOPY N/A 05/15/2015   Procedure: ESOPHAGOGASTRODUODENOSCOPY (EGD);  Surgeon: Corbin Ade, MD;  [facility];  Service: Endoscopy;  Laterality: N/A;  230   ESOPHAGOGASTRODUODENOSCOPY (EGD) WITH ESOPHAGEAL DILATION  02/25/2012   ZOW:[id number] esophageal web-s/p dilation anddisruption as described above. Status post prior gastric with Billroth I configuration. Abnormal gastric mucosa at the anastomosis. Gastric biopsy showed mild chronic inflammation but no H. pylori    ESOPHAGOGASTRODUODENOSCOPY (EGD) WITH PROPOFOL N/A 11/04/2016   Dr. Jena Gauss: Reflux esophagitis, small hiatal hernia status post hemigastrectomy.  Single patent efferent small bowel limb appeared normal, 2 x 2 centimeter tongue of salmon epithelium again seen, esophageal dilation.  Biopsies consistent with reflux changes, not Barrett's however this was confirmed on prior EGDs.  Offer 3-year follow-up EGD August 2021.   ESOPHAGOGASTRODUODENOSCOPY (EGD) WITH PROPOFOL N/A 03/03/2020   Procedure: ESOPHAGOGASTRODUODENOSCOPY (EGD) WITH PROPOFOL;  Surgeon: Corbin Ade, MD;  [facility];  Service: Endoscopy;  Laterality: N/A;  3:00pm   ESOPHAGOGASTRODUODENOSCOPY (EGD) WITH  PROPOFOL N/A 01/10/2022   Procedure: ESOPHAGOGASTRODUODENOSCOPY (EGD) WITH PROPOFOL;  Surgeon: Lanelle Bal, DO;  [facility];  Service: Endoscopy;  Laterality: N/A;   FLEXIBLE SIGMOIDOSCOPY N/A 01/10/2022   Procedure: FLEXIBLE SIGMOIDOSCOPY;  Surgeon: Lanelle Bal, DO;  [facility];  Service: Endoscopy;  Laterality: N/A;   HERNIA REPAIR     right inguinal   MALONEY DILATION N/A 06/03/2014   Procedure: Elease Hashimoto DILATION;  Surgeon: Corbin Ade, MD;  [facility];  Service: Endoscopy;  Laterality: N/A;   MALONEY DILATION N/A 05/15/2015   Procedure: Elease Hashimoto DILATION;  Surgeon: Corbin Ade, MD;  [facility];  Service: Endoscopy;  Laterality: N/A;   MALONEY DILATION N/A 11/04/2016   Procedure: Elease Hashimoto DILATION;  Surgeon: Corbin Ade, MD;  [facility];  Service: Endoscopy;  Laterality: N/A;   MALONEY DILATION N/A 03/03/2020   Procedure: Elease Hashimoto DILATION;  Surgeon: Corbin Ade, MD;  [facility];  Service: Endoscopy;  Laterality: N/A;   PORTACATH PLACEMENT     SAVORY DILATION N/A 06/03/2014   Procedure: SAVORY DILATION;  Surgeon: Corbin Ade, MD;  [facility];  Service: Endoscopy;  Laterality: N/A;  Social History:  reports that he quit smoking about 9 years ago. His smoking use included cigarettes. He has a 20.00 pack-year smoking history. He has never used smokeless tobacco. He reports that he does not drink alcohol and does not use drugs.  No Known Allergies  Family History  Problem Relation Age of Onset   Hypertension Mother    Arthritis Mother    Pneumonia Mother    Hypertension Father    Heart attack Father    Bone cancer Cousin    Colon cancer Neg Hx     Prior to Admission medications   Medication Sig Start Date End Date Taking? Authorizing Provider  azithromycin (ZITHROMAX) 250 MG tablet Take 250 mg by mouth as directed. 03/31/22  Yes [provider]  apixaban  (ELIQUIS) 5 MG TABS tablet Take 1 tablet (5 mg total) by mouth 2 (two) times daily. Restart 01/11/22 01/10/22   Catarina Hartshorn, MD  atorvastatin (LIPITOR) 20 MG tablet Take 20 mg by mouth daily.    [provider]  Bacillus Coagulans-Inulin (PROBIOTIC) 1-250 BILLION-MG CAPS Take 1 capsule by mouth daily.    [provider]  chlorproMAZINE (THORAZINE) 25 MG tablet Take 1 tablet (25 mg total) by mouth every 6 (six) hours as needed. Patient taking differently: Take 25 mg by mouth every 6 (six) hours as needed for nausea or vomiting. 01/01/22   Doreatha Massed, MD  Cholecalciferol (VITAMIN D) 2000 units CAPS Take 2,000 Units by mouth daily.    [provider]  Cholecalciferol (VITAMIN D3) 10 MCG (400 UNIT) tablet Take 400 Units by mouth daily.    [provider]  CREON 36000-114000 units CPEP capsule Take 72,000-108,000 Units by mouth See admin instructions. Take 3 capsules (108,000 units) by mouth with meals and take 2 capsules (72,000 units) with snacks. 11/18/21   [provider]  cyanocobalamin 100 MCG tablet Take by mouth.    [provider]  diclofenac Sodium (VOLTAREN) 1 % GEL Apply 4 g topically 4 (four) times daily. 01/28/22   [provider]  dicyclomine (BENTYL) 10 MG capsule Take by mouth. 01/18/22 01/18/23  [provider]  diphenoxylate-atropine (LOMOTIL) 2.5-0.025 MG tablet Take 1 tablet by mouth 4 times daily 02/04/22   Tiffany Kocher, PA-C  escitalopram (LEXAPRO) 20 MG tablet Take 1 tablet (20 mg total) by mouth daily. 03/08/22   Doreatha Massed, MD  ferrous sulfate 325 (65 FE) MG EC tablet Take by mouth.    [provider]  gabapentin (NEURONTIN) 100 MG capsule TAKE 1 CAPSULE BY MOUTH EVERY DAY AT BEDTIME Patient taking differently: Take 100 mg by mouth at bedtime. 10/22/21   Doreatha Massed, MD  HYDROcodone-acetaminophen (NORCO/VICODIN) 5-325 MG tablet Take 1 tablet by mouth every 8 (eight) hours  as needed for moderate pain. 03/17/22   Doreatha Massed, MD  magnesium oxide (MAG-OX) 400 (240 Mg) MG tablet TAKE 1 TABLET BY MOUTH TWICE A DAY 01/22/22   Doreatha Massed, MD  megestrol (MEGACE) 400 MG/10ML suspension Take 10 mLs (400 mg total) by mouth 2 (two) times daily. 02/22/22   Doreatha Massed, MD  mirtazapine (REMERON) 30 MG tablet TAKE 1 TABLET BY MOUTH AT BEDTIME 02/16/22   Doreatha Massed, MD  Multiple Vitamin (MULTIVITAMIN WITH MINERALS) TABS tablet Take 1 tablet by mouth daily.    [provider]  ondansetron (ZOFRAN-ODT) 4 MG disintegrating tablet TAKE 1 TABLET BY MOUTH EVERY 4 HOURS AS NEEDED FOR NAUSEA AND VOMITING 04/02/22   Eber Hong, MD  pantoprazole (PROTONIX) 40 MG tablet Take 1 tablet (40 mg total) by mouth 2 (two) times daily before a meal. 06/15/21   Tiffany Kocher, PA-C  PREVALITE 4 g packet Take 1 packet (4 g total) by mouth daily. DO NOT TAKE WITHIN 2 HOURS OF OTHER MEDICATIONS 08/04/21   Tiffany Kocher, PA-C  prochlorperazine (COMPAZINE) 10 MG tablet Take 10 mg by mouth every 6 (six) hours as needed for vomiting or nausea.    [provider]  sucralfate (CARAFATE) 1 GM/10ML suspension TAKE 10 MLS (1 G TOTAL) BY MOUTH 4 (FOUR) TIMES DAILY AS NEEDED. FOR BREAKTHROUGH HEARTBURN Patient taking differently: Take 1 g by mouth 4 (four) times daily as needed (breakthrough heartburn). 10/22/20   Tiffany Kocher, PA-C  traMADol (ULTRAM) 50 MG tablet Take 1 tablet by mouth every 6 (six) hours as needed for moderate pain or severe pain. 09/07/21   [provider]    Physical Exam: Vitals:   04/03/22 1723 04/03/22 1730 04/03/22 1800 04/03/22 1830  BP:  117/76 96/64 (!) 114/98  Pulse:  82 81 90  Resp:  (!) 24 19 19   Temp: 98.2 F (36.8 C)     TempSrc: Rectal     SpO2:  100% 100% 100%  Weight:      Height:       General: Elderly male. Awake and alert and oriented x3. No acute cardiopulmonary distress.  HEENT: Normocephalic  atraumatic.  Right and left ears normal in appearance.  Pupils equal, round, reactive to light. Extraocular muscles are intact. Sclerae anicteric and noninjected.  Moist mucosal membranes. No mucosal lesions.  Neck: Neck supple without lymphadenopathy. No carotid bruits. No masses palpated.  Cardiovascular: Regular rate with normal S1-S2 sounds. No murmurs, rubs, gallops auscultated. No JVD.  Respiratory: Good respiratory effort with no wheezes, rales, rhonchi. Lungs clear to auscultation bilaterally.  No accessory muscle use. Abdomen: Soft, nontender, nondistended. Active bowel sounds. No masses or hepatosplenomegaly  Skin: No rashes, lesions, or ulcerations.  Dry, warm to touch. 2+ dorsalis pedis and radial pulses. Musculoskeletal: No calf or leg pain. All major joints not erythematous nontender.  No upper or lower joint deformation.  Good ROM.  No contractures  Psychiatric: Intact judgment and insight. Pleasant and cooperative. Neurologic: No focal neurological deficits. Strength is 5/5 and symmetric in upper and lower extremities.  Cranial nerves II through XII are grossly intact.  Data Reviewed: Results for orders placed or performed during the hospital encounter of 04/03/22 (from the past 24 hour(s))  Blood culture (routine x 2)     Status: None (Preliminary result)   Collection Time: 04/03/22  4:23 PM   Specimen: Left Antecubital; Blood  Result Value Ref Range   Specimen Description      LEFT ANTECUBITAL BOTTLES DRAWN AEROBIC AND ANAEROBIC   Special Requests      Blood Culture adequate volume Performed at [facility], [address] [place], [place]    Culture PENDING    Report Status PENDING   CBC with Differential     Status: Abnormal   Collection Time: 04/03/22  4:23 PM  Result Value Ref Range   WBC 4.2 4.0 - 10.5 K/uL   RBC 2.51 (L) 4.22 - 5.81 MIL/uL   Hemoglobin 7.7 (L) 13.0 - 17.0 g/dL   HCT 04/05/22 (L) 44.9 - 75.3 %   MCV 92.8 80.0 - 100.0 fL   MCH 30.7 26.0 -  34.0 pg   MCHC 33.0 30.0 - 36.0 g/dL  RDW 15.1 11.5 - 15.5 %   Platelets 74 (L) 150 - 400 K/uL   nRBC 0.0 0.0 - 0.2 %   Neutrophils Relative % 75 %   Neutro Abs 3.2 1.7 - 7.7 K/uL   Lymphocytes Relative 13 %   Lymphs Abs 0.5 (L) 0.7 - 4.0 K/uL   Monocytes Relative 10 %   Monocytes Absolute 0.4 0.1 - 1.0 K/uL   Eosinophils Relative 1 %   Eosinophils Absolute 0.0 0.0 - 0.5 K/uL   Basophils Relative 0 %   Basophils Absolute 0.0 0.0 - 0.1 K/uL   Immature Granulocytes 1 %   Abs Immature Granulocytes 0.03 0.00 - 0.07 K/uL  Basic metabolic panel     Status: Abnormal   Collection Time: 04/03/22  4:23 PM  Result Value Ref Range   Sodium 130 (L) 135 - 145 mmol/L   Potassium 3.0 (L) 3.5 - 5.1 mmol/L   Chloride 102 98 - 111 mmol/L   CO2 22 22 - 32 mmol/L   Glucose, Bld 94 70 - 99 mg/dL   BUN 15 8 - 23 mg/dL   Creatinine, Ser 9.97 0.61 - 1.24 mg/dL   Calcium 6.8 (L) 8.9 - 10.3 mg/dL   GFR, Estimated >87 >46 mL/min   Anion gap 6 5 - 15  Blood culture (routine x 2)     Status: None (Preliminary result)   Collection Time: 04/03/22  4:28 PM   Specimen: BLOOD RIGHT HAND  Result Value Ref Range   Specimen Description      BLOOD RIGHT HAND BOTTLES DRAWN AEROBIC AND ANAEROBIC   Special Requests      Blood Culture adequate volume Performed at [facility], [address] [place], [place] [address]    Culture PENDING    Report Status PENDING   Lactic acid, plasma     Status: None   Collection Time: 04/03/22  5:10 PM  Result Value Ref Range   Lactic Acid, Venous 0.9 0.5 - 1.9 mmol/L  Lactic acid, plasma     Status: None   Collection Time: 04/03/22  6:14 PM  Result Value Ref Range   Lactic Acid, Venous 1.6 0.5 - 1.9 mmol/L   No results found.   Assessment and Plan: No notes have been filed under this hospital service. Service: Hospitalist  Principal Problem:   Bacteremia Active Problems:   Adenocarcinoma of colon with mucinous features   Anemia, chronic disease   Fever with  chills   S/P partial gastrectomy   Personal history of PE (pulmonary embolism)   HTN (hypertension)  Bacteremia Vancomycin started Repeat blood cultures Fevers with chills Repeat CBC in the morning Acute on chronic anemia Will collect Hemoccult to evaluate for occult blood loss I did discuss blood transfusion with the patient.  The patient is amenable to this.  Risks and benefits were discussed.  Will premedicate with Tylenol and Benadryl.  Follow-up CBC in the morning Status post partial gastrectomy Adenocarcinoma Patient is currently on TPN.  Will continue TPN Orders for pharmacy consult placed History of PE Continue anticoagulation   Advance Care Planning:   Code Status: Full Code confirmed by patient  Consults: None  Family Communication: Wife present during interview and exam  Severity of Illness: The appropriate patient status for this patient is INPATIENT. Inpatient status is judged to be reasonable and necessary in order to provide the required intensity of service to ensure the patient's safety. The patient's presenting symptoms, physical exam findings, and initial radiographic and laboratory  data in the context of their chronic comorbidities is felt to place them at high risk for further clinical deterioration. Furthermore, it is not anticipated that the patient will be medically stable for discharge from the hospital within 2 midnights of admission.   * I certify that at the point of admission it is my clinical judgment that the patient will require inpatient hospital care spanning beyond 2 midnights from the point of admission due to high intensity of service, high risk for further deterioration and high frequency of surveillance required.*  Author: Levie Heritage, DO 04/03/2022 7:17 PM  For on call review [email or web address].

## 2022-04-04 ENCOUNTER — Other Ambulatory Visit (HOSPITAL_COMMUNITY): Payer: Self-pay | Admitting: *Deleted

## 2022-04-04 ENCOUNTER — Inpatient Hospital Stay (HOSPITAL_COMMUNITY): Payer: Medicare Other

## 2022-04-04 DIAGNOSIS — C252 Malignant neoplasm of tail of pancreas: Secondary | ICD-10-CM

## 2022-04-04 DIAGNOSIS — N1831 Chronic kidney disease, stage 3a: Secondary | ICD-10-CM | POA: Diagnosis not present

## 2022-04-04 DIAGNOSIS — R7881 Bacteremia: Secondary | ICD-10-CM

## 2022-04-04 DIAGNOSIS — Z903 Acquired absence of stomach [part of]: Secondary | ICD-10-CM

## 2022-04-04 DIAGNOSIS — E538 Deficiency of other specified B group vitamins: Secondary | ICD-10-CM | POA: Diagnosis not present

## 2022-04-04 LAB — BLOOD CULTURE ID PANEL (REFLEXED) - BCID2

## 2022-04-04 LAB — PHOSPHORUS: Phosphorus: 2.7 mg/dL (ref 2.5–4.6)

## 2022-04-04 LAB — OCCULT BLOOD X 1 CARD TO LAB, STOOL: Fecal Occult Bld: POSITIVE — AB

## 2022-04-04 LAB — COMPREHENSIVE METABOLIC PANEL
ALT: 24 U/L (ref 0–44)
AST: 27 U/L (ref 15–41)
Albumin: 1.6 g/dL — ABNORMAL LOW (ref 3.5–5.0)
Alkaline Phosphatase: 73 U/L (ref 38–126)
Anion gap: 8 (ref 5–15)
BUN: 14 mg/dL (ref 8–23)
CO2: 20 mmol/L — ABNORMAL LOW (ref 22–32)
Calcium: 7.1 mg/dL — ABNORMAL LOW (ref 8.9–10.3)
Chloride: 107 mmol/L (ref 98–111)
Creatinine, Ser: 1.01 mg/dL (ref 0.61–1.24)
GFR, Estimated: >60 mL/min (ref >60)
Glucose, Bld: 135 mg/dL — ABNORMAL HIGH (ref 70–99)
Potassium: 3 mmol/L — ABNORMAL LOW (ref 3.5–5.1)
Sodium: 135 mmol/L (ref 135–145)
Total Bilirubin: 0.2 mg/dL — ABNORMAL LOW (ref 0.3–1.2)
Total Protein: 4.5 g/dL — ABNORMAL LOW (ref 6.5–8.1)

## 2022-04-04 LAB — ECHOCARDIOGRAM COMPLETE
Area-P 1/2: 4.15 cm2
Calc EF: 60.6 %
Height: 68 in
S' Lateral: 2.4 cm
Single Plane A2C EF: 56.7 %
Single Plane A4C EF: 58 %
Weight: 1920 oz

## 2022-04-04 LAB — URINE CULTURE: Culture: NO GROWTH

## 2022-04-04 LAB — CBC
HCT: 23.3 % — ABNORMAL LOW (ref 39.0–52.0)
Hemoglobin: 7.7 g/dL — ABNORMAL LOW (ref 13.0–17.0)
MCH: 30.7 pg (ref 26.0–34.0)
MCHC: 33 g/dL (ref 30.0–36.0)
MCV: 92.8 fL (ref 80.0–100.0)
Platelets: 69 10*3/uL — ABNORMAL LOW (ref 150–400)
RBC: 2.51 MIL/uL — ABNORMAL LOW (ref 4.22–5.81)
RDW: 14.9 % (ref 11.5–15.5)
WBC: 4.4 10*3/uL (ref 4.0–10.5)
nRBC: 0 % (ref 0.0–0.2)

## 2022-04-04 LAB — MAGNESIUM: Magnesium: 2.4 mg/dL (ref 1.7–2.4)

## 2022-04-04 LAB — TRIGLYCERIDES: Triglycerides: 92 mg/dL (ref <150)

## 2022-04-04 MED ORDER — POTASSIUM CHLORIDE IN NACL 40-0.9 MEQ/L-% IV SOLN: INTRAVENOUS | Status: AC

## 2022-04-04 MED ORDER — INSULIN ASPART 100 UNIT/ML IJ SOLN
0.0000 [IU] | Freq: Four times a day (QID) | INTRAMUSCULAR | Status: DC
  Administered 2022-04-05: 1 [IU] via SUBCUTANEOUS
  Administered 2022-04-05: 2 [IU] via SUBCUTANEOUS
  Administered 2022-04-06: 1 [IU] via SUBCUTANEOUS

## 2022-04-04 MED ORDER — POTASSIUM CHLORIDE CRYS ER 20 MEQ PO TBCR
40.0000 meq | EXTENDED_RELEASE_TABLET | Freq: Once | ORAL | Status: AC
  Administered 2022-04-04: 40 meq via ORAL
  Filled 2022-04-04: qty 2

## 2022-04-04 MED ORDER — CHLORHEXIDINE GLUCONATE CLOTH 2 % EX PADS
6.0000 | MEDICATED_PAD | Freq: Every day | CUTANEOUS | Status: DC
  Administered 2022-04-04 – 2022-04-13 (×9): 6 via TOPICAL

## 2022-04-04 MED ORDER — TRAVASOL 10 % IV SOLN
INTRAVENOUS | Status: AC
  Filled 2022-04-04: qty 600

## 2022-04-04 NOTE — Progress Notes (Signed)
Initial Nutrition Assessment RD working remotely.   DOCUMENTATION CODES:   Underweight  INTERVENTION:  - TPN per Pharmacist.  - encourage PO intakes as tolerated.  - complete NFPE when feasible.   NUTRITION DIAGNOSIS:   Increased nutrient needs related to acute illness, chronic illness as evidenced by estimated needs.  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 He presented to the ED due to fevers, chills, and rigors x5 days. The patient initially visited the ED on 04/02/21 with fevers, chills, and nausea. Blood cultures were obtained during that visit. He was given ceftriaxone, azithromycin, and IV fluids. His BP improved and tachycardia improved and he was discharged home in stable condition.  He was called to come back to the ED because of positive blood cultures. He continued to have nausea, fevers, chills. He complains of generalized weakness and shortness of breath. Patient reported chronic diarrhea with 4-5 BMs/day. His apixaban was restarted on 01/11/2022.  Unable to reach patient by phone. Please see Pharmacist's note from earlier today concerning TPN regimen and plan. Patient started on home TPN on 03/19/22 that is infused through L chest port.   No meal intake percentages documented in the flow sheet.   Patient is being followed by [facility] and he was last assessed on 03/10/22. Estimated nutrition  needs slightly above estimated needs calculated on 02/25/22 due to acute illness.  Weight yesterday was documented as 120 lb which appears to possibly be a stated weight. Weight on 03/24/22 was 116 lb and weight on 02/23/23 was 125 lb. This indicates 9 lb / 7.2% wt loss in 1 month; significant for time frame.  Per notes: - bacteremia - pancreatic adenocarcinoma pending pancreatic tail resection and splenectomy; awaiting improvement in nutrition status - chronic diarrhea with cdiff in 12/2021   Labs reviewed; K: 3 mmol/l, Ca: 7.1 mg/dl, Mg and Phos WDL.   Medications reviewed; 1 capsule risaquad/day, 1 tablet lomotil QID, sliding scale novolog, 108000 units creon TID with meals, 72000 units creon BID with snacks, 400 mg megace BID, 30 mg remeron/day, 40 mg oral protonix BID, 40 mEq Klor-Con x1 dose 1/14.    NUTRITION - FOCUSED PHYSICAL EXAM:  RD working remotely.  Diet Order:   Diet Order             Diet regular Room service appropriate? Yes; Fluid consistency: Thin  Diet effective now                   EDUCATION NEEDS:   No education needs have been identified at this time  Skin:  Skin Assessment: Reviewed RN Assessment  Last BM:  1/13 - no further details available  Height:   Ht Readings from Last 1 Encounters:  04/03/22 5\' 8"  (1.727 m)    Weight:   Wt Readings from Last 1 Encounters:  04/03/22 54.4 kg    BMI:  Body mass index is 18.25 kg/m.  Estimated Nutritional Needs:  Kcal:  1900-2100 kcal  Protein:  95-115 grams Fluid:  >/= 2.3 L/day     Trenton Gammon, MS, RD, LDN, CNSC Clinical Dietitian PRN/Relief staff On-call/weekend pager # available in [facility]

## 2022-04-04 NOTE — Progress Notes (Addendum)
PHARMACY - TOTAL PARENTERAL NUTRITION CONSULT NOTE   Indication:  decreased nutrition due to pancreatic tail resection- started 03/19/22  Patient Measurements: Height: 5\' 8"  (172.7 cm) Weight: 54.4 kg (120 lb) IBW/kg (Calculated) : 68.4 TPN AdjBW (KG): 54.4 Body mass index is 18.25 kg/m. Usual Weight:   Assessment:  Patient started on TPN 03/19/22 as a result of decreased nutrition due to pancreatic tail resection.  Glucose / Insulin:  94-135 Electrolytes: K 3.0   corrected calcium 9.0 Renal: WNL Hepatic: Albumin 1.6 Intake / Output; MIVF: NS w/ Kcl 40 mEq/L @ 75 mL/hr   Central access: implanted port  TPN start date: 03/19/22  Nutritional Goals: pending  RD Assessment:  pending  Current Nutrition:  TPN  Plan:  Potassium choloride 40 mEq PO x 1   Will initiate TPN at 50 mL/hr at 1800- reduced rate since unable to determine patient's home regimen at this time due to home infusion center closed today and no TPN bag here to verify. Electrolytes in TPN: Na 59mEq/L, K 50mEq/L, Ca 3mEq/L, Mg 105mEq/L, and Phos 50mmol/L. Maximize acetate Add standard MVI and trace elements to TPN Initiate Sensitive q6h SSI and adjust as needed  Reduce MIVF to 25 mL/hr at 1800 Monitor TPN labs on Mon/Thurs,   12m, PharmD Clinical Pharmacist 04/04/2022 10:08 AM

## 2022-04-04 NOTE — Progress Notes (Signed)
[facility] lab called and said all 4 bottles came back positive. two of which were aerobic and the other 2 were anaerobic. staph epi meca-c methacillin resistant. MD Tat notified.

## 2022-04-04 NOTE — Progress Notes (Signed)
  Echocardiogram 2D Echocardiogram has been performed.  [name] 04/04/2022, 3:16 PM

## 2022-04-04 NOTE — Progress Notes (Addendum)
PROGRESS NOTE  [name] QPY:[id number] DOB: [birth year] DOA: 04/03/2022 PCP: Romeo Rabon, MD  Brief History:  72 year old male with a history of pancreatic adenocarcinoma, colon adenocarcinoma status post resection May 2012, adenocarcinoma of the left lung status post resection, small bowel resection status post ileorectal anastomosis with short gut syndrome, iron deficiency anemia, pulmonary embolism on apixaban, hypertension, CKD stage III, GERD, remote tobacco and alcohol use (quit 2012) presenting with fevers, chills, and rigors of 5 days duration.  The patient initially visited the emergency department on 04/02/2021 with fevers, chills, and nausea.  Blood cultures were obtained during that visit.  He was given ceftriaxone, azithromycin, and IV fluids.  His BP improved and tachycardia improved and he was discharged home in stable condition.  He was called back to come to the emergency department because of positive blood cultures.  He continued to have nausea, fevers, chills.  He complains of generalized weakness and shortness of breath.  He denies any current emesis.  He states that he has chronic diarrhea, 4-5 bowel movements daily.  He denies hematochezia but has occasional melena.  He denies any headache, neck pain, coughing, hemoptysis. Notably, the patient was recently admitted to the hospital from 01/08/2022 to 01/11/2022 due to symptomatic anemia.  He was given 2 unit PRBC.  He will underwent EGD on 01/10/2022 which showed grade a reflux esophagitis without bleeding.  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  01/11/2022 flex sig showed patent side-to-side ileal colonic anastomosis with healthy-appearing mucosa.  There was diffuse moderate inflammation at the colonic anastomosis.  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  His apixaban was restarted on 01/11/2022. In the ED, the patient had a low-grade temperature of 99.8 F.  His blood pressure was soft  with systolic blood pressures in the 100s.  Oxygen saturation was 99% on room air.  WBC 4.2, hemoglobin 10.7, platelets 24,000.  Sodium 130, potassium 3.0, bicarbonate 22, serum creatinine 1.09.  Blood cultures are showing gram-positive cocci in 2 out of 2 sets.  The patient was started on vancomycin.   Assessment/Plan: Bacteremia -04/02/2022 blood culture showing GPC 2 out of 2 sets -Pulmonary PCR shows Staphylococcus epidermidis -Follow repeat blood cultures 04/03/2022 -Continue vancomycin -Patient seems a bit hesitant for excision of his Port-A-Cath if he would ultimately require -Echo  Hyponatremia -Secondary to poor solute intake and volume depletion -Continue IV normal saline  Hypokalemia -Replete -Check magnesium--2.4  Pancreatic adenocarcinoma -Status post FOLFIRINOX 10/16/2021>> 12/30/2021 -Patient was started on TPN 03/19/2022 to improve his nutritional status for pancreatic tail resection -Pancreatic tail resection and splenectomy had been put on hold secondary to his poor nutritional status  Symptomatic anemia/melena -Once again, the patient's hemoglobin has drifted down from 11.6 on 03/17/22 -04/04/2021 hemoglobin 7.7 -GI consult -Patient does take iron supplementation  Iron deficiency anemia -04/20/2022 iron saturation 50  Pancytopenia due to antineoplastic chemotherapy (HCC) Last FOLFIRINOX 01/01/22 Serial CBC  CKD 3a -baseline creatinine 1.0-1.3   Chronic abdominal pain Patient has chronic left-sided abdominal pain Previously attributable to the patient's malignancy Unchanged in the past month 01/06/2022 MRI abdomen--area of restricted diffusion in the distal pancreas 2.0 x 1.9 cm.  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  He has loose stools.  He has some dark black stools.  He denies any vomiting but has nausea.  He is some shortness of breath.  He denies any fevers, chills, headache, neck pain, hemoptysis.  Objective: Vitals:   04/03/22 2245 04/03/22 2254 04/03/22 2309 04/04/22 0301  BP:  102/60 122/89 (!) 98/46  Pulse: 90 87 98 75  Resp: 17 19  18   Temp:   98.3 F (36.8 C) 98.6 F (37 C)  TempSrc:   Oral Oral  SpO2: 98% 98% 100% 99%  Weight:      Height:        Intake/Output Summary (Last 24 hours) at 04/04/2022 0727 Last data filed at 04/04/2022 0600 Gross per 24 hour  Intake 313.22 ml  Output 420 ml  Net -106.78 ml   Weight change:  Exam:  General:  Pt is alert, follows commands appropriately, not in acute distress HEENT: No icterus, No thrush, No neck mass, [place]/AT Cardiovascular: RRR, S1/S2, no rubs, no gallops Respiratory: Scattered bibasilar rales.  No wheezing Abdomen: Soft/+BS, non tender, non distended, no guarding Extremities: No edema, No lymphangitis, No petechiae, No rashes, no synovitis   Data Reviewed: I have personally reviewed following labs and imaging studies Basic Metabolic Panel: Recent Labs  Lab 04/02/22 1401 04/03/22 1623 04/04/22 0456  NA 128* 130* 135  K 3.1* 3.0* 3.0*  CL 95* 102 107  CO2 23 22 20*  GLUCOSE 103* 94 135*  BUN 20 15 14   CREATININE 1.27* 1.09 1.01  CALCIUM 7.1* 6.8* 7.1*  MG  --   --  2.4  PHOS  --   --  2.7   Liver Function Tests: Recent Labs  Lab 04/02/22 1401 04/04/22 0456  AST 38 27  ALT 36 24  ALKPHOS 80 73  BILITOT 0.5 0.2*  PROT  5.4* 4.5*  ALBUMIN 1.9* 1.6*   No results for input(s): "LIPASE", "AMYLASE" in the last 168 hours. No results for input(s): "AMMONIA" in the last 168 hours. Coagulation Profile: Recent Labs  Lab 04/02/22 1401  INR 1.5*   CBC: Recent Labs  Lab 04/02/22 1401 04/03/22 1623 04/04/22 0456  WBC 3.5* 4.2 4.4  NEUTROABS 2.9 3.2  --   HGB 9.0* 7.7* 7.7*  HCT 27.1* 23.3* 23.3*  MCV 93.4 92.8 92.8  PLT 81* 74* 69*   Cardiac Enzymes: No results for input(s): "CKTOTAL", "CKMB", "CKMBINDEX", "TROPONINI" in the last 168 hours. BNP: Invalid input(s): "POCBNP" CBG: No results for input(s): "GLUCAP" in the last 168 hours. HbA1C: No results for input(s): "HGBA1C" in the last 72 hours. Urine analysis:    Component Value Date/Time   COLORURINE YELLOW 04/02/2022 1533   APPEARANCEUR HAZY (A) 04/02/2022 1533   LABSPEC 1.013 04/02/2022 1533   PHURINE 5.0 04/02/2022 1533   GLUCOSEU NEGATIVE 04/02/2022 1533   HGBUR NEGATIVE 04/02/2022 1533   BILIRUBINUR NEGATIVE 04/02/2022 1533   KETONESUR NEGATIVE 04/02/2022 1533   PROTEINUR 30 (A) 04/02/2022 1533   UROBILINOGEN 0.2 11/30/2014 1115   NITRITE NEGATIVE 04/02/2022 1533   LEUKOCYTESUR NEGATIVE 04/02/2022 1533   Sepsis Labs: @LABRCNTIP (procalcitonin:4,lacticidven:4) )  Recent Results (from the past 240 hour(s))  Resp panel by RT-PCR (RSV, Flu A&B, Covid) Anterior Nasal Swab     Status: None   Collection Time: 04/02/22 12:36 PM   Specimen: Anterior Nasal Swab  Result Value Ref Range Status   SARS Coronavirus 2 by RT PCR NEGATIVE NEGATIVE Final    Comment: (NOTE) SARS-CoV-2 target nucleic acids are NOT DETECTED.  The SARS-CoV-2 RNA is generally detectable in upper respiratory specimens during the acute phase of infection. The lowest concentration of SARS-CoV-2 viral copies this assay can detect is 138 copies/mL. A negative result does not preclude SARS-Cov-2 infection and should not be used as the sole basis for treatment or other  patient management decisions. A negative result may occur with  improper specimen collection/handling, submission of specimen other than nasopharyngeal swab, presence of viral mutation(s) within the areas targeted by this assay, and inadequate number of viral copies(<138 copies/mL). A negative result must be combined with clinical observations, patient history, and epidemiological information. The expected result is Negative.  Fact Sheet for Patients:  BloggerCourse.com  Fact Sheet for Healthcare Providers:  SeriousBroker.it  This test is no t yet approved or cleared by the [place] FDA and  has been authorized for detection and/or diagnosis of SARS-CoV-2 by FDA under an Emergency Use Authorization (EUA). This EUA will remain  in effect (meaning this test can be used) for the duration of the COVID-19 declaration under Section 564(b)(1) of the Act, 21 U.S.C.section 360bbb-3(b)(1), unless the authorization is terminated  or revoked sooner.       Influenza A by PCR NEGATIVE NEGATIVE Final   Influenza B by PCR NEGATIVE NEGATIVE Final    Comment: (NOTE) The Xpert Xpress SARS-CoV-2/FLU/RSV plus assay is intended as an aid in the diagnosis of influenza from Nasopharyngeal swab specimens and should not be used as a sole basis for treatment. Nasal washings and aspirates are unacceptable for Xpert Xpress SARS-CoV-2/FLU/RSV testing.  Fact Sheet for Patients: BloggerCourse.com  Fact Sheet for Healthcare Providers: SeriousBroker.it  This test is not yet approved or cleared by the [place] FDA and has been authorized for detection and/or diagnosis of SARS-CoV-2 by FDA under an Emergency Use Authorization (EUA). This EUA will remain in effect (meaning this test can be used) for the duration of the COVID-19 declaration under Section 564(b)(1) of the Act, 21 U.S.C. section  360bbb-3(b)(1), unless the authorization is terminated or revoked.     Resp Syncytial Virus by PCR NEGATIVE NEGATIVE Final    Comment: (NOTE) Fact Sheet for Patients: BloggerCourse.com  Fact Sheet for Healthcare Providers: SeriousBroker.it  This test is not yet approved or cleared by the [place] FDA and has been authorized for detection and/or diagnosis of SARS-CoV-2 by FDA under an Emergency Use Authorization (EUA). This EUA will remain in effect (meaning this test can be used) for the duration of the COVID-19 declaration under Section 564(b)(1) of the Act, 21 U.S.C. section 360bbb-3(b)(1), unless the authorization is terminated or revoked.  Performed at [facility], [address] [place], [place] [address]   Culture, blood (Routine x 2)     Status: None (Preliminary result)   Collection Time: 04/02/22  2:01 PM   Specimen: Left Antecubital; Blood  Result Value Ref Range Status   Specimen Description   Final    LEFT ANTECUBITAL BOTTLES DRAWN AEROBIC AND ANAEROBIC Performed at [facility], [address] [place], [place] [address]    Special Requests   Final    Blood Culture  adequate volume Performed at [facility], [address] [place], [place] [address]    Culture  Setup Time   Final    GRAM POSITIVE COCCI ANAEROBIC BOTTLE ONLY Gram Stain Report Called to,Read Back By and Verified With: WHITE M @ 0838 ON [id number] BY HENDERSON L GRAM POSITIVE COCCI AEROBIC BOTTLE ONLY Organism ID to follow CRITICAL RESULT CALLED TO, READ BACK BY AND VERIFIED WITH:  C/ M. WHITE, RN 04/03/22 1400 A. LAFRANCE Performed at [facility], [address] [place], [place] [address]    Culture GRAM POSITIVE COCCI  Final   Report Status PENDING  Incomplete  Culture, blood (Routine x 2)     Status: None (Preliminary result)   Collection Time: 04/02/22  2:01 PM   Specimen: BLOOD LEFT HAND  Result Value Ref Range Status   Specimen  Description   Final    BLOOD LEFT HAND BOTTLES DRAWN AEROBIC ONLY Performed at [facility], [address] [place], [place] [address]    Special Requests   Final    Blood Culture adequate volume Performed at [facility], [address], [place], [place] [address]    Culture  Setup Time   Final    GRAM POSITIVE COCCI AEROBIC BOTTLE ONLY Gram Stain Report Called to,Read Back By and Verified With: WHITE M @ 1334 ON [id number] BY HENDERSON L CRITICAL VALUE NOTED.  VALUE IS CONSISTENT WITH PREVIOUSLY REPORTED AND CALLED VALUE. Performed at [facility], [address] [place], [place] [address]    Culture   Final    NO GROWTH 2 DAYS Performed at [facility], [address] [place], [place] [address]    Report Status PENDING  Incomplete  Blood Culture ID Panel (Reflexed)     Status: Abnormal   Collection Time: 04/02/22  2:01 PM  Result Value Ref Range Status   Enterococcus faecalis NOT DETECTED NOT DETECTED Final   Enterococcus Faecium NOT DETECTED NOT DETECTED Final   Listeria monocytogenes NOT DETECTED NOT DETECTED Final   Staphylococcus species DETECTED (A) NOT DETECTED Final    Comment: CRITICAL RESULT CALLED TO, READ BACK BY AND VERIFIED WITH:  C/ M. WHITE, RN 04/03/22 1400 A. LAFRANCE    Staphylococcus aureus (BCID) NOT DETECTED NOT DETECTED Final   Staphylococcus epidermidis DETECTED (A) NOT DETECTED Final    Comment: Methicillin (oxacillin) resistant coagulase negative staphylococcus. Possible blood culture contaminant (unless isolated from more than one blood culture draw or clinical case suggests pathogenicity). No antibiotic treatment is indicated for blood  culture contaminants. CRITICAL RESULT CALLED TO, READ BACK BY AND VERIFIED WITH:  C/ M. WHITE, RN 04/03/22 1400 A. LAFRANCE    Staphylococcus lugdunensis NOT DETECTED NOT DETECTED Final   Streptococcus species NOT DETECTED NOT DETECTED Final   Streptococcus agalactiae NOT DETECTED NOT DETECTED Final    Streptococcus pneumoniae NOT DETECTED NOT DETECTED Final   Streptococcus pyogenes NOT DETECTED NOT DETECTED Final   A.calcoaceticus-baumannii NOT DETECTED NOT DETECTED Final   Bacteroides fragilis NOT DETECTED NOT DETECTED Final   Enterobacterales NOT DETECTED NOT DETECTED Final   Enterobacter cloacae complex NOT DETECTED NOT DETECTED Final   Escherichia coli NOT DETECTED NOT DETECTED Final   Klebsiella aerogenes NOT DETECTED NOT DETECTED Final   Klebsiella oxytoca NOT DETECTED NOT DETECTED Final   Klebsiella pneumoniae NOT DETECTED NOT DETECTED Final   Proteus species NOT DETECTED NOT DETECTED Final   Salmonella species NOT DETECTED NOT DETECTED Final   Serratia marcescens NOT DETECTED NOT  DETECTED Final   Haemophilus influenzae NOT DETECTED NOT DETECTED Final   Neisseria meningitidis NOT DETECTED NOT DETECTED Final   Pseudomonas aeruginosa NOT DETECTED NOT DETECTED Final   Stenotrophomonas maltophilia NOT DETECTED NOT DETECTED Final   Candida albicans NOT DETECTED NOT DETECTED Final   Candida auris NOT DETECTED NOT DETECTED Final   Candida glabrata NOT DETECTED NOT DETECTED Final   Candida krusei NOT DETECTED NOT DETECTED Final   Candida parapsilosis NOT DETECTED NOT DETECTED Final   Candida tropicalis NOT DETECTED NOT DETECTED Final   Cryptococcus neoformans/gattii NOT DETECTED NOT DETECTED Final   Methicillin resistance mecA/C DETECTED (A) NOT DETECTED Final    Comment: CRITICAL RESULT CALLED TO, READ BACK BY AND VERIFIED WITH:  C/ M. WHITE, RN 04/03/22 1400 A. LAFRANCE Performed at [facility], [address] [place], [place] [address]   Blood culture (routine x 2)     Status: None (Preliminary result)   Collection Time: 04/03/22  4:23 PM   Specimen: Left Antecubital; Blood  Result Value Ref Range Status   Specimen Description   Final    LEFT ANTECUBITAL BOTTLES DRAWN AEROBIC AND ANAEROBIC   Special Requests Blood Culture adequate volume  Final   Culture   Final     NO GROWTH < 12 HOURS Performed at [facility], [address], [place], [place] [address]    Report Status PENDING  Incomplete  Blood culture (routine x 2)     Status: None (Preliminary result)   Collection Time: 04/03/22  4:28 PM   Specimen: BLOOD RIGHT HAND  Result Value Ref Range Status   Specimen Description   Final    BLOOD RIGHT HAND BOTTLES DRAWN AEROBIC AND ANAEROBIC   Special Requests Blood Culture adequate volume  Final   Culture   Final    NO GROWTH < 12 HOURS Performed at [facility], [address] [place], [place] [address]    Report Status PENDING  Incomplete     Scheduled Meds:  acidophilus  1 capsule Oral Daily   apixaban  5 mg Oral BID   atorvastatin  20 mg Oral Daily   cholestyramine light  4 g Oral Daily   dicyclomine  10 mg Oral TID AC & HS   diphenoxylate-atropine  1 tablet Oral QID   escitalopram  20 mg Oral Daily   gabapentin  100 mg Oral QHS   lipase/protease/amylase  108,000 Units Oral TID WC   megestrol  400 mg Oral BID   mirtazapine  30 mg Oral QHS   pantoprazole  40 mg Oral BID AC   Ensure Max Protein  11 oz Oral BID BM   Continuous Infusions:  vancomycin      Procedures/Studies: DG Chest Port 1 View  Result Date: 04/02/2022 CLINICAL DATA:  Possible sepsis EXAM: PORTABLE CHEST 1 VIEW COMPARISON:  01/08/2022 FINDINGS: Cardiac size is within normal limits. There is decreased volume in the left lung. Linear densities are seen in left mid and left lower lung fields. Surgical staples are seen in left parahilar region. Left hemidiaphragm is elevated. There is blunting of left lateral CP angle. There is no pneumothorax. Tip of left subclavian chest port is seen in superior vena cava. IMPRESSION: Postsurgical changes are noted in left hemithorax. There are no new infiltrates or signs of pulmonary edema. Electronically Signed   By: Ernie Avena M.D.   On: 04/02/2022 13:21    Catarina Hartshorn, DO  [facility]  If 7PM-7AM, please contact  night-coverage [email or web address] Password [email or web address] 04/04/2022, 7:27 AM   LOS: 1 day

## 2022-04-04 NOTE — Hospital Course (Signed)
72 year old male with a history of pancreatic adenocarcinoma, colon adenocarcinoma status post resection May 2012, adenocarcinoma of the left lung status post resection, small bowel resection status post ileorectal anastomosis with short gut syndrome, iron deficiency anemia, pulmonary embolism on apixaban, hypertension, CKD stage III, GERD, remote tobacco and alcohol use (quit 2012) presenting with fevers, chills, and rigors of 5 days duration.  The patient initially visited the emergency department on 04/02/2021 with fevers, chills, and nausea.  Blood cultures were obtained during that visit.  He was given ceftriaxone, azithromycin, and IV fluids.  His BP improved and tachycardia improved and he was discharged home in stable condition.  He was called back to come to the emergency department because of positive blood cultures.  He continued to have nausea, fevers, chills.  He complains of generalized weakness and shortness of breath.  He denies any current emesis.  He states that he has chronic diarrhea, 4-5 bowel movements daily.  He denies hematochezia but has occasional melena.  He denies any headache, neck pain, coughing, hemoptysis. Notably, the patient was recently admitted to the hospital from 01/08/2022 to 01/11/2022 due to symptomatic anemia.  He was given 2 unit PRBC.  He will underwent EGD on 01/10/2022 which showed grade a reflux esophagitis without bleeding.  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  01/11/2022 flex sig showed patent side-to-side ileal colonic anastomosis with healthy-appearing mucosa.  There was diffuse moderate inflammation at the colonic anastomosis.  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  His apixaban was restarted on 01/11/2022. In the ED, the patient had a low-grade temperature of 99.8 F.  His blood pressure was soft with systolic blood pressures in the 100s.  Oxygen saturation was 99% on room air.  WBC 4.2, hemoglobin 10.7, platelets 24,000.  Sodium 130,  potassium 3.0, bicarbonate 22, serum creatinine 1.09.  Blood cultures are showing gram-positive cocci in 2 out of 2 sets.  The patient was started on vancomycin.

## 2022-04-04 NOTE — TOC Initial Note (Addendum)
Transition of Care [facility]) - Initial/Assessment Note    Patient Details  Name: [name] MRN: [id number] Date of Birth: 03-Aug-1950  Transition of Care [facility] CM/SW Contact:    Karn Cassis, LCSW Phone Number: 04/04/2022, 11:51 AM  Clinical Narrative:  Patient admitted for bacteremia. Assessment completed due to high risk readmission score. Pt reports he lives with his wife and is independent with ADLs. He is on TPN at home and reports he is active with [employer]. LCSW contacted Clydie Braun with [facility] and confirmed. Pt has transportation to appointments and plans to return home when medically stable. TOC will continue to follow.                 Expected Discharge Plan: Home w Home Health Services Barriers to Discharge: Continued Medical Work up   Patient Goals and CMS Choice Patient states their goals for this hospitalization and ongoing recovery are:: return home     [place] ownership interest in [facility].provided to::  (n/a)    Expected Discharge Plan and Services In-house Referral: Clinical Social Work   Post Acute Care Choice: Resumption of Svcs/PTA Provider Living arrangements for the past 2 months: Skilled Nursing Facility                           HH Arranged: RN HH Agency: [facility] Date [facility] Agency Contacted: 04/04/22 Time HH Agency Contacted: 1151 Representative spoke with at [facility] Agency: Clydie Braun  Prior Living Arrangements/Services Living arrangements for the past 2 months: Skilled Nursing Facility Lives with:: Spouse Patient language and need for interpreter reviewed:: Yes Do you feel safe going back to the place where you live?: Yes          Current home services: Home RN Criminal Activity/Legal Involvement Pertinent to Current Situation/Hospitalization: No - Comment as needed  Activities of Daily Living Home Assistive Devices/Equipment: Hearing aid, Eyeglasses ADL Screening (condition at time of  admission) Patient's cognitive ability adequate to safely complete daily activities?: Yes Is the patient deaf or have difficulty hearing?: Yes Does the patient have difficulty seeing, even when wearing glasses/contacts?: Yes Does the patient have difficulty concentrating, remembering, or making decisions?: No Patient able to express need for assistance with ADLs?: Yes Does the patient have difficulty dressing or bathing?: No Independently performs ADLs?: Yes (appropriate for developmental age) Does the patient have difficulty walking or climbing stairs?: Yes Weakness of Legs: Both Weakness of Arms/Hands: None  Permission Sought/Granted                  Emotional Assessment     Affect (typically observed): Appropriate Orientation: : Oriented to Self, Oriented to Place, Oriented to  Time, Oriented to Situation Alcohol / Substance Use: Not Applicable Psych Involvement: No (comment)  Admission diagnosis:  Bacteremia [R78.81] Patient Active Problem List   Diagnosis Date Noted   Bacteremia 04/03/2022   Genetic testing 03/02/2022   Heme positive stool 01/09/2022   Current use of long term anticoagulation    Symptomatic anemia 01/08/2022   Pancreatic cancer (HCC) 10/07/2021   Loss of weight 09/12/2019   Pancreatic insufficiency 05/03/2019   Dyslipidemia 05/03/2019   B12 deficiency 05/03/2019   Anxiety 05/03/2019   Early satiety 04/25/2019   Seizure disorder (HCC) 09/16/2016   CKD (chronic kidney disease) stage 3, GFR 30-59 ml/min (HCC) 06/14/2016   Patient has nasogastric tube    Ileus (HCC) 05/15/2016   Iron deficiency anemia 03/23/2016  Acute renal failure (HCC) 03/19/2016   Normocytic anemia 03/19/2016   Hypotension 03/19/2016   Anemia    History of colon cancer, stage III    Hx of colon cancer, stage III    History of colonic polyps    Barrett's esophagus without dysplasia    Dysphagia    Essential hypertension 11/30/2014   Dysphagia, pharyngoesophageal phase     Mucosal abnormality of esophagus    SBO (small bowel obstruction) (HCC) 09/17/2013   Rectal bleeding 07/11/2012   Bowel obstruction (HCC) 07/11/2012   Abnormal CT scan, colon 07/11/2012   Esophageal dysphagia 02/22/2012   H/O Clostridium difficile infection 02/22/2012   Diarrhea 09/10/2011   Cellulitis 09/10/2011   Nausea 06/07/2011   Abdominal distention 06/07/2011   HTN (hypertension) 06/07/2011   Partial small bowel obstruction (HCC) 05/31/2011   GI bleed 05/27/2011   Coagulopathy (HCC) 05/27/2011   Gastroenteritis 05/27/2011   Small bowel obstruction (HCC) 05/13/2011   LUQ pain 05/13/2011   Pulmonary embolism (HCC) 01/17/2011   Chest pain at rest 01/17/2011   Pneumonia 12/11/2010   Hematemesis 12/05/2010   Chronic abdominal pain 12/05/2010   Pancytopenia due to antineoplastic chemotherapy (HCC) 12/05/2010   Coffee ground emesis 11/29/2010   Esophageal reflux disease 11/15/2010   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 [place] [address] Phone: [phone] Fax: [phone]  [facility] [address] [place] - [address] [place] [address] Phone: [phone] Fax: [phone]     Social Determinants of Health (SDOH) Social History: SDOH Screenings   Food Insecurity: No Food Insecurity (04/03/2022)  Housing: Low Risk  (04/03/2022)  Transportation Needs: No Transportation Needs (04/03/2022)  Utilities: Not At Risk (04/03/2022)  Tobacco Use: Medium Risk (04/03/2022)   SDOH  Interventions:     Readmission Risk Interventions    04/04/2022   11:49 AM  Readmission Risk Prevention Plan  Transportation Screening Complete  HRI or Home Care Consult Complete  Social Work Consult for Recovery Care Planning/Counseling Complete  Palliative Care Screening Not Applicable  Medication Review [employer]) Complete

## 2022-04-05 DIAGNOSIS — R195 Other fecal abnormalities: Secondary | ICD-10-CM

## 2022-04-05 DIAGNOSIS — R7881 Bacteremia: Secondary | ICD-10-CM | POA: Diagnosis not present

## 2022-04-05 DIAGNOSIS — C252 Malignant neoplasm of tail of pancreas: Secondary | ICD-10-CM | POA: Diagnosis not present

## 2022-04-05 DIAGNOSIS — D61810 Antineoplastic chemotherapy induced pancytopenia: Secondary | ICD-10-CM

## 2022-04-05 DIAGNOSIS — N1831 Chronic kidney disease, stage 3a: Secondary | ICD-10-CM | POA: Diagnosis not present

## 2022-04-05 DIAGNOSIS — Z903 Acquired absence of stomach [part of]: Secondary | ICD-10-CM | POA: Diagnosis not present

## 2022-04-05 DIAGNOSIS — E538 Deficiency of other specified B group vitamins: Secondary | ICD-10-CM | POA: Diagnosis not present

## 2022-04-05 DIAGNOSIS — T451X5A Adverse effect of antineoplastic and immunosuppressive drugs, initial encounter: Secondary | ICD-10-CM

## 2022-04-05 DIAGNOSIS — D638 Anemia in other chronic diseases classified elsewhere: Secondary | ICD-10-CM | POA: Diagnosis not present

## 2022-04-05 DIAGNOSIS — B957 Other staphylococcus as the cause of diseases classified elsewhere: Secondary | ICD-10-CM

## 2022-04-05 LAB — COMPREHENSIVE METABOLIC PANEL
ALT: 20 U/L (ref 0–44)
AST: 19 U/L (ref 15–41)
Albumin: 1.5 g/dL — ABNORMAL LOW (ref 3.5–5.0)
Alkaline Phosphatase: 69 U/L (ref 38–126)
Anion gap: 7 (ref 5–15)
BUN: 11 mg/dL (ref 8–23)
CO2: 20 mmol/L — ABNORMAL LOW (ref 22–32)
Calcium: 7.1 mg/dL — ABNORMAL LOW (ref 8.9–10.3)
Chloride: 109 mmol/L (ref 98–111)
Creatinine, Ser: 0.98 mg/dL (ref 0.61–1.24)
GFR, Estimated: >60 mL/min (ref >60)
Glucose, Bld: 123 mg/dL — ABNORMAL HIGH (ref 70–99)
Potassium: 4.2 mmol/L (ref 3.5–5.1)
Sodium: 136 mmol/L (ref 135–145)
Total Bilirubin: 0.3 mg/dL (ref 0.3–1.2)
Total Protein: 4.2 g/dL — ABNORMAL LOW (ref 6.5–8.1)

## 2022-04-05 LAB — GLUCOSE, CAPILLARY
Glucose-Capillary: 139 mg/dL — ABNORMAL HIGH (ref 70–99)
Glucose-Capillary: 116 mg/dL — ABNORMAL HIGH (ref 70–99)
Glucose-Capillary: 175 mg/dL — ABNORMAL HIGH (ref 70–99)
Glucose-Capillary: 105 mg/dL — ABNORMAL HIGH (ref 70–99)
Glucose-Capillary: 120 mg/dL — ABNORMAL HIGH (ref 70–99)

## 2022-04-05 LAB — GASTROINTESTINAL PANEL BY PCR, STOOL (REPLACES STOOL CULTURE)

## 2022-04-05 LAB — CBC
HCT: 22.6 % — ABNORMAL LOW (ref 39.0–52.0)
Hemoglobin: 7.4 g/dL — ABNORMAL LOW (ref 13.0–17.0)
MCH: 30.7 pg (ref 26.0–34.0)
MCHC: 32.7 g/dL (ref 30.0–36.0)
MCV: 93.8 fL (ref 80.0–100.0)
Platelets: 71 10*3/uL — ABNORMAL LOW (ref 150–400)
RBC: 2.41 MIL/uL — ABNORMAL LOW (ref 4.22–5.81)
RDW: 15.5 % (ref 11.5–15.5)
WBC: 5.5 10*3/uL (ref 4.0–10.5)
nRBC: 0 % (ref 0.0–0.2)

## 2022-04-05 LAB — PREPARE RBC (CROSSMATCH)

## 2022-04-05 LAB — TRIGLYCERIDES: Triglycerides: 63 mg/dL (ref <150)

## 2022-04-05 LAB — MAGNESIUM: Magnesium: 2.1 mg/dL (ref 1.7–2.4)

## 2022-04-05 LAB — PHOSPHORUS: Phosphorus: 2.1 mg/dL — ABNORMAL LOW (ref 2.5–4.6)

## 2022-04-05 MED ORDER — TRAVASOL 10 % IV SOLN
INTRAVENOUS | Status: AC
  Filled 2022-04-05: qty 720

## 2022-04-05 MED ORDER — ACETAMINOPHEN 325 MG PO TABS
650.0000 mg | ORAL_TABLET | Freq: Four times a day (QID) | ORAL | Status: DC | PRN
  Administered 2022-04-05: 650 mg via ORAL
  Filled 2022-04-05: qty 2

## 2022-04-05 MED ORDER — K PHOS MONO-SOD PHOS DI & MONO 155-852-130 MG PO TABS
500.0000 mg | ORAL_TABLET | Freq: Every day | ORAL | Status: DC
  Administered 2022-04-05: 500 mg via ORAL
  Filled 2022-04-05: qty 2

## 2022-04-05 MED ORDER — ALTEPLASE 2 MG IJ SOLR
2.0000 mg | Freq: Once | INTRAMUSCULAR | Status: AC | PRN
  Administered 2022-04-05: 2 mg
  Filled 2022-04-05: qty 2

## 2022-04-05 MED ORDER — SODIUM CHLORIDE 0.9% IV SOLUTION: Freq: Once | INTRAVENOUS | Status: AC

## 2022-04-05 NOTE — Progress Notes (Signed)
Port a cath re-accessed per policy.  Alteplase administered and port blood draws back beautifully.

## 2022-04-05 NOTE — Progress Notes (Signed)
   04/05/22 1626  Assess: MEWS Score  Temp (!) 102.8 F (39.3 C)  BP 131/65  MAP (mmHg) 85  Pulse Rate (!) 106  Resp (!) 27  SpO2 95 %  O2 Device Room Air  Assess: MEWS Score  MEWS Temp 2  MEWS Systolic 0  MEWS Pulse 1  MEWS RR 2  MEWS LOC 0  MEWS Score 5  MEWS Score Color Red  Assess: if the MEWS score is Yellow or Red  Were vital signs taken at a resting state? Yes  Focused Assessment No change from prior assessment  Does the patient meet 2 or more of the SIRS criteria? Yes  Does the patient have a confirmed or suspected source of infection? Yes  Provider and Rapid Response Notified? Yes  MEWS guidelines implemented *See Row Information* No, vital signs rechecked  Treat  Pain Scale 0-10  Pain Score 5  Notify: Charge Nurse/RN  Name of Charge Nurse/RN Notified Nita Sells  Date Charge Nurse/RN Notified 04/05/22  Time Charge Nurse/RN Notified 1628  Provider Notification  Provider Name/Title TAT  Date Provider Notified 04/05/22  Time Provider Notified 1628  Method of Notification Face-to-face  Notification Reason Change in status;Critical Result  Provider response See new orders  Date of Provider Response 04/05/22  Time of Provider Response 1629  Assess: SIRS CRITERIA  SIRS Temperature  1  SIRS Pulse 1  SIRS Respirations  1  SIRS WBC 0  SIRS Score Sum  3

## 2022-04-05 NOTE — Progress Notes (Signed)
PHARMACY - TOTAL PARENTERAL NUTRITION CONSULT NOTE   Indication:  decreased nutrition due to pancreatic tail resection- started 03/19/22  Patient Measurements: Height: 5\' 8"  (172.7 cm) Weight: 54.4 kg (120 lb) IBW/kg (Calculated) : 68.4 TPN AdjBW (KG): 54.4 Body mass index is 18.25 kg/m. Usual Weight:   Assessment:  Patient started on TPN 03/19/22 as a result of decreased nutrition due to pancreatic tail resection.  Glucose / Insulin:  123/ 1 Electrolytes: K 4.2  corrected calcium 9.0 Renal: WNL Hepatic: Albumin 1.6 Intake / Output; MIVF: NS w/ Kcl 40 mEq/L @ 25 mL/hr   Central access: implanted port  TPN start date: 03/19/22  Nutritional Goals: pending  RD Assessment: Estimated Needs Total Energy Estimated Needs: 1900-2100 kcal Total Protein Estimated Needs: 95-115 grams Total Fluid Estimated Needs: >/= 2.3 L/daypending  Current Nutrition:  TPN  Plan:  Potassium phosphate 500 mg po daily for phos of 2.1  Increase TPN at 65 mL/hr at 1800- obtained patiens TPN formula from [place], [place] and adjusted as needed per patient labs.  Electrolytes in TPN: Na 20mEq/L, K 52mEq/L, Ca 64mEq/L, Mg 59mEq/L, and Phos 36mmol/L. Maximize acetate Add standard MVI and trace elements to TPN Initiate Sensitive q6h SSI and adjust as needed  Reduce MIVF to 25 mL/hr at 1800 >Monitor TPN labs on Mon/Thurs,  F/U with [facility], phos and mag Tuesday (1/16) - labs ordered  11-20-1972, PharmD Clinical Pharmacist 04/05/2022 9:23 AM

## 2022-04-05 NOTE — Progress Notes (Signed)
ID engaged per Blood Cx+ MRSE on 1/12 adbn 1/13 -1/12 2/2 staph epi, staph hominis -1/13 2/2 staph epi -I called labs to run sens on both days of Cx from 1/13 and 1/12 -Ok to continue Vnac form now while sens pending -TTE today showed no veg -Follow flood Cx from 1/12

## 2022-04-05 NOTE — Consult Note (Signed)
Gastroenterology Consult   Referring Provider: No ref. provider found Primary Care Physician:  Romeo Rabon, MD Primary Gastroenterologist:  Roetta Sessions, MD  [name]; [id number]; 05-31-1950   Admit date: 04/03/2022  LOS: 2 days   Date of Consultation: 04/05/2022  Reason for Consultation:  chronic blood loss anemia, heme positive stool    History of Present Illness   [name] is a 72 y.o. 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 Most recent surgery left him with ileorectal anastomosis. H/o pancreatic exocrine insufficiency. H/O stage III chronic kidney disease, history of PE on chronic anticoagulation with Eliquis, IDA, Barrett's esophagus, h/o left upper lobectomy for lung adenocarcinoma, pancreatic adenocarcinoma recently on chemotherapy (completed 12/2021) surgery pending presenting due to positive blood cultures.   Patient states he has been on TPN for about two weeks. His surgery has been postponed due to low albumin. Over the past several weeks his appetite has been poor. He is eating bites only. Over the past week, he has had vomiting and not keeping anything down. At baseline he has diarrhea that is difficult to manage. He denies change in stool frequency. He notes stools have been back intermittently. He denies Pepto. He takes oral iron chronically. No brbpr. No hematemesis. No heartburn. H/O suspected Cdiff colonization 12/2021.  He has history of anemia and heme positive stool. Has required IV iron in the past.  Feraheme received August 2023.  Chronically supplemented for B12 deficiency. Had plans for repeat flex sig once he completed surgery and cancer treatments.    Seen in the ED 1/12 for cough and some N/V. Initially given Rocephin and  Zithromax to cover for possible PNA. He tolerated oral intake and was released. He was called back to the ED 1/13 due to positive blood cultures. Patient admitted and IV vanc started.    Presented April 02, 2022 with hemoglobin of 9.0, baseline hemoglobin in the upper 9 range.  January 13 hemoglobin 7.7 likely multifactorial with hemodilution plan or role.  Over the past 48 hours his hemoglobin has been essentially stable at 7.4.  Platelets 71,000.  White blood cell count 5500 (initially 3500 on admission).  Albumin 1.5 today.  Urine culture no growth final.  Stool Hemoccult positive.  1 unit of packed red blood cells planned.  EGD October 2023: - LA Grade A reflux esophagitis with no bleeding. - Patent Billroth I gastroduodenostomy was found, characterized by edema. Biopsied.  Active gastropathy and mild chronic gastritis showing foveolar hyperplasia and focal intestinal metaplasia.  Negative for H. pylori. - Normal second portion of the duodenum and third portion of the duodenum.   Flexible sigmoidoscopy October 2023: - Preparation of the colon was poor. - Patent side-to-side ileo-colonic anastomosis, characterized by healthy appearing mucosa. - Diffuse moderate inflammation was found at the colonic anastomosis.  -erythema and shallow ulceration noted of the neoterminal ileum. Biopsied.  Active ileitis, negative for dysplasia and granulomas. -Consider flexible sigmoidoscopy as an outpatient for surveillance purposes due to relatively poor prep.    Prior to Admission medications   Medication Sig Start Date End Date Taking? Authorizing Provider  apixaban (ELIQUIS) 5 MG TABS tablet Take 1 tablet (5 mg total) by mouth 2 (two) times daily. Restart 01/11/22 01/10/22  Yes Tat, Onalee Hua, MD  atorvastatin (LIPITOR) 20 MG tablet Take  20 mg by mouth daily.   Yes [provider]  azithromycin (ZITHROMAX) 250 MG tablet Take 250 mg by mouth as directed. 03/31/22  Yes [provider]   Bacillus Coagulans-Inulin (PROBIOTIC) 1-250 BILLION-MG CAPS Take 1 capsule by mouth daily.   Yes [provider]  chlorproMAZINE (THORAZINE) 25 MG tablet Take 1 tablet (25 mg total) by mouth every 6 (six) hours as needed. Patient taking differently: Take 25 mg by mouth every 6 (six) hours as needed for nausea or vomiting. 01/01/22  Yes Doreatha Massed, MD  Cholecalciferol (VITAMIN D) 2000 units CAPS Take 2,000 Units by mouth daily.   Yes [provider]  Cholecalciferol (VITAMIN D3) 10 MCG (400 UNIT) tablet Take 400 Units by mouth daily.   Yes [provider]  CREON 36000-114000 units CPEP capsule Take 72,000-108,000 Units by mouth See admin instructions. Take 3 capsules (108,000 units) by mouth with meals and take 2 capsules (72,000 units) with snacks. 11/18/21  Yes [provider]  cyanocobalamin 100 MCG tablet Take by mouth.   Yes [provider]  diclofenac Sodium (VOLTAREN) 1 % GEL Apply 4 g topically 4 (four) times daily. 01/28/22  Yes [provider]  dicyclomine (BENTYL) 10 MG capsule Take 10 mg by mouth 2 (two) times daily before a meal. 01/18/22 01/18/23 Yes [provider]  diphenoxylate-atropine (LOMOTIL) 2.5-0.025 MG tablet Take 1 tablet by mouth 4 times daily 02/04/22  Yes Tiffany Kocher, PA-C  escitalopram (LEXAPRO) 20 MG tablet Take 1 tablet (20 mg total) by mouth daily. 03/08/22  Yes Doreatha Massed, MD  ferrous sulfate 325 (65 FE) MG EC tablet Take by mouth.   Yes [provider]  gabapentin (NEURONTIN) 100 MG capsule TAKE 1 CAPSULE BY MOUTH EVERY DAY AT BEDTIME Patient taking differently: Take 100 mg by mouth at bedtime. 10/22/21  Yes Doreatha Massed, MD  HYDROcodone-acetaminophen (NORCO/VICODIN) 5-325 MG tablet Take 1 tablet by mouth every 8 (eight) hours as needed for moderate pain. 03/17/22  Yes Doreatha Massed, MD  hydrocortisone cream 1 % Apply 1 Application topically 2 (two) times daily.    Yes [provider]  magnesium oxide (MAG-OX) 400 (240 Mg) MG tablet TAKE 1 TABLET BY MOUTH TWICE A DAY 01/22/22  Yes Doreatha Massed, MD  megestrol (MEGACE) 400 MG/10ML suspension Take 10 mLs (400 mg total) by mouth 2 (two) times daily. 02/22/22  Yes Doreatha Massed, MD  mirtazapine (REMERON) 30 MG tablet TAKE 1 TABLET BY MOUTH AT BEDTIME 02/16/22  Yes Doreatha Massed, MD  Multiple Vitamin (INFUVITE ADULT IV) 5 mLs See admin instructions. Inject 5 mls from each vial #1 and #2   Yes [provider]  Multiple Vitamin (MULTIVITAMIN WITH MINERALS) TABS tablet Take 1 tablet by mouth daily.   Yes [provider]  ondansetron (ZOFRAN-ODT) 4 MG disintegrating tablet TAKE 1 TABLET BY MOUTH EVERY 4 HOURS AS NEEDED FOR NAUSEA AND VOMITING 04/02/22  Yes Eber Hong, MD  pantoprazole (PROTONIX) 40 MG tablet Take 1 tablet (40 mg total) by mouth 2 (two) times daily before a meal. 06/15/21  Yes Tiffany Kocher, PA-C  PREVALITE 4 g packet Take 1 packet (4 g total) by mouth daily. DO NOT TAKE WITHIN 2 HOURS OF OTHER MEDICATIONS 08/04/21  Yes Tiffany Kocher, PA-C  prochlorperazine (COMPAZINE) 10 MG tablet Take 10 mg by mouth every 6 (six) hours as needed for vomiting or nausea.   Yes [provider]  sterile water SOLN with Travasol 10 % SOLN  50 g/L, dextrose 70 % SOLN 20 %, sodium INJ 35 mEq/L, potassium INJ 30 mEq/L, calcium GLUCONATE INJ 4.7 mEq/L, magnesium SULFATE INJ 5 mEq/L, phosphorus INJ 15 mmol/L, M.V.I. Adult INJ 10 mL, trace elements Cr-Cu-Mn-Se-Zn (MTE-5 Concentrate) 12-998-500-60 MCG/ML SOLN 1 mL, fat emulsion 30 % EMUL 100 mL/L continuous.   Yes [provider]  sucralfate (CARAFATE) 1 GM/10ML suspension TAKE 10 MLS (1 G TOTAL) BY MOUTH 4 (FOUR) TIMES DAILY AS NEEDED. FOR BREAKTHROUGH HEARTBURN Patient taking differently: Take 1 g by mouth 4 (four) times daily as needed (breakthrough heartburn). 10/22/20  Yes Tiffany Kocher, PA-C  traMADol  (ULTRAM) 50 MG tablet Take 1 tablet by mouth every 6 (six) hours as needed for moderate pain or severe pain. 09/07/21  Yes [provider]    Current Facility-Administered Medications  Medication Dose Route Frequency Provider Last Rate Last Admin   0.9 %  sodium chloride infusion (Manually program via Guardrails IV Fluids)   Intravenous Once Tat, David, MD       0.9 % NaCl with KCl 40 mEq / L  infusion   Intravenous Continuous Tat, David, MD 25 mL/hr at 04/05/22 0601 New Bag at 04/05/22 0601   acidophilus (RISAQUAD) capsule 1 capsule  1 capsule Oral Daily Levie Heritage, DO   1 capsule at 04/04/22 4401   atorvastatin (LIPITOR) tablet 20 mg  20 mg Oral Daily Levie Heritage, DO   20 mg at 04/04/22 0272   Chlorhexidine Gluconate Cloth 2 % PADS 6 each  6 each Topical Daily Tat, Onalee Hua, MD   6 each at 04/04/22 1749   cholestyramine light (PREVALITE) packet 4 g  4 g Oral Daily Levie Heritage, DO   4 g at 04/04/22 5366   dicyclomine (BENTYL) capsule 10 mg  10 mg Oral TID AC & HS Levie Heritage, DO   10 mg at 04/04/22 2111   diphenoxylate-atropine (LOMOTIL) 2.5-0.025 MG per tablet 1 tablet  1 tablet Oral QID Levie Heritage, DO   1 tablet at 04/04/22 2110   escitalopram (LEXAPRO) tablet 20 mg  20 mg Oral Daily Levie Heritage, DO   20 mg at 04/04/22 0836   gabapentin (NEURONTIN) capsule 100 mg  100 mg Oral QHS Levie Heritage, DO   100 mg at 04/04/22 2110   HYDROcodone-acetaminophen (NORCO/VICODIN) 5-325 MG per tablet 1 tablet  1 tablet Oral Q8H PRN Levie Heritage, DO   1 tablet at 04/05/22 0558   insulin aspart (novoLOG) injection 0-9 Units  0-9 Units Subcutaneous Rob Bunting, MD   1 Units at 04/05/22 0120   lipase/protease/amylase (CREON) capsule 108,000 Units  108,000 Units Oral TID WC Levie Heritage, DO   108,000 Units at 04/04/22 1729   lipase/protease/amylase (CREON) capsule 72,000 Units  72,000 Units Oral BID PRN Levie Heritage, DO       megestrol (MEGACE) 400 MG/10ML  suspension 400 mg  400 mg Oral BID Levie Heritage, DO   400 mg at 04/04/22 2110   mirtazapine (REMERON) tablet 30 mg  30 mg Oral QHS Levie Heritage, DO   30 mg at 04/04/22 2111   ondansetron (ZOFRAN) tablet 4 mg  4 mg Oral Q6H PRN Levie Heritage, DO   4 mg at 04/05/22 4403   Or   ondansetron (ZOFRAN) injection 4 mg  4 mg Intravenous Q6H PRN Levie Heritage, DO       pantoprazole (PROTONIX) EC tablet 40 mg  40 mg Oral BID AC Levie Heritage, DO   40 mg at 04/04/22 1730   phosphorus (K PHOS NEUTRAL) tablet 500 mg  500 mg Oral Daily Tat, David, MD       polyethylene glycol (MIRALAX / GLYCOLAX) packet 17 g  17 g Oral Daily PRN Levie Heritage, DO       prochlorperazine (COMPAZINE) tablet 10 mg  10 mg Oral Q6H PRN Levie Heritage, DO   10 mg at 04/05/22 0114   protein supplement (ENSURE MAX) liquid  11 oz Oral BID BM Levie Heritage, DO   11 oz at 04/04/22 0054   TPN ADULT (ION)   Intravenous Continuous TPN Tat, Onalee Hua, MD 50 mL/hr at 04/04/22 1800 New Bag at 04/04/22 1800   vancomycin (VANCOREADY) IVPB 750 mg/150 mL  750 mg Intravenous Q24H Calton Dach I, RPH 150 mL/hr at 04/04/22 2114 750 mg at 04/04/22 2114   Facility-Administered Medications Ordered in Other Encounters  Medication Dose Route Frequency Provider Last Rate Last Admin   acetaminophen (TYLENOL) 325 MG tablet            heparin lock flush 100 unit/mL  500 Units Intravenous Once Kefalas, Thomas S, PA-C       loratadine (CLARITIN) 10 MG tablet            sodium chloride flush (NS) 0.9 % injection 10 mL  10 mL Intravenous PRN Hubbard Hartshorn, NP   10 mL at 11/05/16 1136    Allergies as of 04/03/2022   (No Known Allergies)    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   Stage 3   Anemia    Anxiety    Arthritis    Barrett's esophagus    Blood transfusion    Bowel obstruction (HCC) 05/13/2012   Recurrent   Bronchitis    Chest pain at rest    Chronic  abdominal pain    Erosive esophagitis    ETOH abuse    quit 03/2010   GERD (gastroesophageal reflux disease)    Hx of Clostridium difficile infection 01/2012   Hypertension    Ileus (HCC)    Iron deficiency anemia 03/23/2016   Lung cancer (HCC)    Obstruction of bowel (HCC) 03/03/2014   Osteoporosis    Pancreatic cancer (HCC)    Personal history of PE (pulmonary embolism) 10/01/2010   Pneumonia    Pulmonary embolism (HCC) 02/2010   Recurrent upper respiratory infection (URI)    Renal disorder    S/P endoscopy 09/28/2010   erosive reflux esophagitis, Billroth I anatomy   S/P partial gastrectomy 1980s   Seizures (HCC)    was on meds for 6 months, Unknown etiology, last seizure was 2017.   Shortness of breath    TIA (transient ischemic attack) 12/2009   Vitamin B12 deficiency     Past Surgical History:  Procedure Laterality Date   ABDOMINAL ADHESION SURGERY  03/04/15   @ [facility]   ABDOMINAL EXPLORATION SURGERY     abdominal sugery     for bowel obstruction x 8, all in 1980s, except for one in 07/2010   APPENDECTOMY  1980s   Billroth 1 hemigastrectomy  1980s   per patient for benign duodenal tumor   BIOPSY  01/10/2022   Procedure: BIOPSY;  Surgeon: Lanelle Bal, DO;  [facility];  Service: Endoscopy;;  gastric,ileum   CARDIAC CATHETERIZATION  07/17/2012   CHOLECYSTECTOMY  1980s  COLON SURGERY  May 2012   left hemicolectomy, colon cancer found at time of surgery for bowel obstruction   COLON SURGERY  10/12/2018   At [facility]: Resection of right colon and terminal ileum with creation of ileorectal anastomosis for large bowel obstruction.  Cecum and ascending colon noted to be attached to the rectum.   COLONOSCOPY  03/18/2011   anastomosis at 35cm. Several adenomatous polyps removed. Sigmoid diverticulosis. Next TCS 02/2013   COLONOSCOPY N/A 07/24/2012   [id number] post segmental resection with normal-appearing colonic anastomosis aside from an adjacent polyp-removed  as described above. Rectal polyp-removed as described above. CT findings appear to have been artifactual. tubular adenomas/prolapsed type polyp.   COLONOSCOPY N/A 05/15/2015   Procedure: COLONOSCOPY;  Surgeon: Corbin Ade, MD;  [facility];  Service: Endoscopy;  Laterality: N/A;   COLONOSCOPY  09/26/2018   at [facility]: prep not adequate for colon cancer surveillance.  Prior end-to-end colonic anastomosis in the rectosigmoid region.  This was patent and characterized by healthy-appearing mucosa.  Anastomosis was traversed.  Normal terminal ileum.   COLONOSCOPY WITH PROPOFOL N/A 11/04/2016   Dr. Jena Gauss, status post subtotal colectomy with normal-appearing residual lower GI tract.  Next colonoscopy in 5 years.   ESOPHAGOGASTRODUODENOSCOPY  09/28/2010   ESOPHAGOGASTRODUODENOSCOPY  12/01/2010   Cervical web status post dilation, erosive esophagitis, B1 hemigastrectomy, inflamed anastomosis   ESOPHAGOGASTRODUODENOSCOPY  04/16/2011   excoriation at [facility] c/w trauma/M-W tear, friable gastric anastomosis, dilation efferent limb   ESOPHAGOGASTRODUODENOSCOPY N/A 06/03/2014   Dr.[name]- cervcal esopphageal web s/p dilation. abnormal distal esophagus bx= barretts esophagus   ESOPHAGOGASTRODUODENOSCOPY N/A 05/15/2015   Procedure: ESOPHAGOGASTRODUODENOSCOPY (EGD);  Surgeon: Corbin Ade, MD;  [facility];  Service: Endoscopy;  Laterality: N/A;  230   ESOPHAGOGASTRODUODENOSCOPY (EGD) WITH ESOPHAGEAL DILATION  02/25/2012   HNQ:[id number] esophageal web-s/p dilation anddisruption as described above. Status post prior gastric with Billroth I configuration. Abnormal gastric mucosa at the anastomosis. Gastric biopsy showed mild chronic inflammation but no H. pylori    ESOPHAGOGASTRODUODENOSCOPY (EGD) WITH PROPOFOL N/A 11/04/2016   Dr. Jena Gauss: Reflux esophagitis, small hiatal hernia status post hemigastrectomy.  Single patent efferent small bowel limb appeared normal, 2 x 2 centimeter tongue of salmon  epithelium again seen, esophageal dilation.  Biopsies consistent with reflux changes, not Barrett's however this was confirmed on prior EGDs.  Offer 3-year follow-up EGD August 2021.   ESOPHAGOGASTRODUODENOSCOPY (EGD) WITH PROPOFOL N/A 03/03/2020   Procedure: ESOPHAGOGASTRODUODENOSCOPY (EGD) WITH PROPOFOL;  Surgeon: Corbin Ade, MD;  [facility];  Service: Endoscopy;  Laterality: N/A;  3:00pm   ESOPHAGOGASTRODUODENOSCOPY (EGD) WITH PROPOFOL N/A 01/10/2022   Procedure: ESOPHAGOGASTRODUODENOSCOPY (EGD) WITH PROPOFOL;  Surgeon: Lanelle Bal, DO;  [facility];  Service: Endoscopy;  Laterality: N/A;   FLEXIBLE SIGMOIDOSCOPY N/A 01/10/2022   Procedure: FLEXIBLE SIGMOIDOSCOPY;  Surgeon: Lanelle Bal, DO;  [facility];  Service: Endoscopy;  Laterality: N/A;   HERNIA REPAIR     right inguinal   MALONEY DILATION N/A 06/03/2014   Procedure: Elease Hashimoto DILATION;  Surgeon: Corbin Ade, MD;  [facility];  Service: Endoscopy;  Laterality: N/A;   MALONEY DILATION N/A 05/15/2015   Procedure: Elease Hashimoto DILATION;  Surgeon: Corbin Ade, MD;  [facility];  Service: Endoscopy;  Laterality: N/A;   MALONEY DILATION N/A 11/04/2016   Procedure: Elease Hashimoto DILATION;  Surgeon: Corbin Ade, MD;  [facility];  Service: Endoscopy;  Laterality: N/A;   MALONEY  DILATION N/A 03/03/2020   Procedure: Elease Hashimoto DILATION;  Surgeon: Corbin Ade, MD;  [facility];  Service: Endoscopy;  Laterality: N/A;   PORTACATH PLACEMENT     SAVORY DILATION N/A 06/03/2014   Procedure: SAVORY DILATION;  Surgeon: Corbin Ade, MD;  [facility];  Service: Endoscopy;  Laterality: N/A;    Family History  Problem Relation Age of Onset   Hypertension Mother    Arthritis Mother    Pneumonia Mother    Hypertension Father    Heart attack Father    Bone cancer Cousin    Colon cancer Neg Hx     Social History   Socioeconomic History    Marital status: Married    Spouse name: Not on file   Number of children: 3   Years of education: Not on file   Highest education level: Not on file  Occupational History    Employer: [employer]  Tobacco Use   Smoking status: Former    Packs/day: 0.50    Years: 40.00    Total pack years: 20.00    Types: Cigarettes    Quit date: 12/20/2012    Years since quitting: 9.2   Smokeless tobacco: Never  Vaping Use   Vaping Use: Never used  Substance and Sexual Activity   Alcohol use: No   Drug use: No   Sexual activity: Not Currently  Other Topics Concern   Not on file  Social History Narrative   Not on file   Social Determinants of Health   Financial Resource Strain: Not on file  Food Insecurity: No Food Insecurity (04/03/2022)   Hunger Vital Sign    Worried About Running Out of Food in the Last Year: Never true    Ran Out of Food in the Last Year: Never true  Transportation Needs: No Transportation Needs (04/03/2022)   PRAPARE - Administrator, Civil Service (Medical): No    Lack of Transportation (Non-Medical): No  Physical Activity: Not on file  Stress: Not on file  Social Connections: Not on file  Intimate Partner Violence: Not At Risk (04/03/2022)   Humiliation, Afraid, Rape, and Kick questionnaire    Fear of Current or Ex-Partner: No    Emotionally Abused: No    Physically Abused: No    Sexually Abused: No     Review of System:   General: positive for anorexia, weight loss, weakness. Eyes: Negative for vision changes.  ENT: Negative for hoarseness, difficulty swallowing , nasal congestion. CV: Negative for chest pain, angina, palpitations, dyspnea on exertion, peripheral edema.  Respiratory: Negative for dyspnea at rest, dyspnea on exertion, cough, sputum, wheezing.  GI: See history of present illness. GU:  Negative for dysuria, hematuria, urinary incontinence, urinary frequency, nocturnal urination.  MS: Negative for joint pain, low back pain.   Derm: Negative for rash or itching.  Neuro: Negative for weakness, abnormal sensation, seizure, frequent headaches, memory loss, confusion.  Psych: Negative for anxiety, depression, suicidal ideation, hallucinations.  Endo: Negative for unusual weight change.  Heme: Negative for bruising or bleeding. Allergy: Negative for rash or hives.      Physical Examination:   Vital signs in last 24 hours: Temp:  [98.4 F (36.9 C)-100.2 F (37.9 C)] 98.4 F (36.9 C) (01/15 0531) Pulse Rate:  [82-99] 82 (01/15 0531) Resp:  [18] 18 (01/15 0531) BP: (106-134)/(59-75) 134/70 (01/15 0531) SpO2:  [97 %-100 %] 100 % (01/15 0531) Last BM Date : 04/03/22  General:  Well-nourished, well-developed in no acute distress.  Head: Normocephalic, atraumatic.   Eyes: Conjunctiva pale, no icterus. Mouth: Oropharyngeal mucosa moist and pink , no lesions erythema or exudate. Neck: Supple without thyromegaly, masses, or lymphadenopathy.  Lungs: Clear to auscultation bilaterally.  Heart: Regular rate and rhythm, no murmurs rubs or gallops.  Abdomen: Bowel sounds are normal,  no hepatosplenomegaly or masses, no abdominal bruits or hernia , no rebound or guarding. Mild luq tenderness. Mild right sided abdominal distention with some mild tympanic bowel sounds.  Rectal: not performed Extremities: No lower extremity edema, clubbing, deformity.  Neuro: Alert and oriented x 4 , grossly normal neurologically.  Skin: Warm and dry, no rash or jaundice.   Psych: Alert and cooperative, normal mood and affect.        Intake/Output from previous day: 01/14 0701 - 01/15 0700 In: 1764.5 [P.O.:480; I.V.:1134.5; IV Piggyback:150] Out: 1400 [Urine:1400] Intake/Output this shift: No intake/output data recorded.  Lab Results:   CBC Recent Labs    04/03/22 1623 04/04/22 0456 04/05/22 0524  WBC 4.2 4.4 5.5  HGB 7.7* 7.7* 7.4*  HCT 23.3* 23.3* 22.6*  MCV 92.8 92.8 93.8  PLT 74* 69* 71*   BMET Recent Labs     04/03/22 1623 04/04/22 0456 04/05/22 0524  NA 130* 135 136  K 3.0* 3.0* 4.2  CL 102 107 109  CO2 22 20* 20*  GLUCOSE 94 135* 123*  BUN 15 14 11   CREATININE 1.09 1.01 0.98  CALCIUM 6.8* 7.1* 7.1*   LFT Recent Labs    04/02/22 1401 04/04/22 0456 04/05/22 0524  BILITOT 0.5 0.2* 0.3  ALKPHOS 80 73 69  AST 38 27 19  ALT 36 24 20  PROT 5.4* 4.5* 4.2*  ALBUMIN 1.9* 1.6* 1.5*    Lipase No results for input(s): "LIPASE" in the last 72 hours.  PT/INR Recent Labs    04/02/22 1401  LABPROT 18.2*  INR 1.5*     Hepatitis Panel No results for input(s): "HEPBSAG", "HCVAB", "HEPAIGM", "HEPBIGM" in the last 72 hours.   Imaging Studies:   ECHOCARDIOGRAM COMPLETE  Result Date: 04/04/2022    ECHOCARDIOGRAM REPORT   Patient Name:   ZERICK PREVETTE Date of Exam: 04/04/2022 Medical Rec #:  04/06/2022        Height:       68.0 in Accession #:    [id number]       Weight:       120.0 lb Date of Birth:  10/07/1950       BSA:          1.645 m Patient Age:    71 years         BP:           95/46 mmHg Patient Gender: M                HR:           91 bpm. Exam Location:  Inpatient Procedure: 2D Echo, Cardiac Doppler and Color Doppler Indications:    Bacteremia  History:        Patient has prior history of Echocardiogram examinations, most                 recent 03/20/2016. Signs/Symptoms:Fever; Risk                 Factors:Hypertension. Cancer. Pulmonary embolus 2012.  Sonographer:    2013 RDCS Referring Phys: [phone] DAVID TAT IMPRESSIONS  1. Left ventricular ejection fraction, by  estimation, is 60 to 65%. Left ventricular ejection fraction by 2D MOD biplane is 60.6 %. The left ventricle has normal function. The left ventricle has no regional wall motion abnormalities. Left ventricular diastolic parameters are consistent with Grade I diastolic dysfunction (impaired relaxation).  2. Right ventricular systolic function is normal. The right ventricular size is normal. There is mildly elevated  pulmonary artery systolic pressure. The estimated right ventricular systolic pressure is 43.2 mmHg.  3. The mitral valve is grossly normal. Trivial mitral valve regurgitation.  4. The aortic valve is tricuspid. Aortic valve regurgitation is not visualized.  5. The inferior vena cava is normal in size with greater than 50% respiratory variability, suggesting right atrial pressure of 3 mmHg. Conclusion(s)/Recommendation(s): No evidence of valvular vegetations on this transthoracic echocardiogram. Consider a transesophageal echocardiogram to exclude infective endocarditis if clinically indicated. FINDINGS  Left Ventricle: Left ventricular ejection fraction, by estimation, is 60 to 65%. Left ventricular ejection fraction by 2D MOD biplane is 60.6 %. The left ventricle has normal function. The left ventricle has no regional wall motion abnormalities. The left ventricular internal cavity size was normal in size. There is no left ventricular hypertrophy. Left ventricular diastolic parameters are consistent with Grade I diastolic dysfunction (impaired relaxation). Indeterminate filling pressures. Right Ventricle: The right ventricular size is normal. No increase in right ventricular wall thickness. Right ventricular systolic function is normal. There is mildly elevated pulmonary artery systolic pressure. The tricuspid regurgitant velocity is 3.17  m/s, and with an assumed right atrial pressure of 3 mmHg, the estimated right ventricular systolic pressure is 43.2 mmHg. Left Atrium: Left atrial size was normal in size. Right Atrium: Right atrial size was normal in size. Pericardium: There is no evidence of pericardial effusion. Mitral Valve: The mitral valve is grossly normal. Trivial mitral valve regurgitation. Tricuspid Valve: The tricuspid valve is grossly normal. Tricuspid valve regurgitation is mild. Aortic Valve: The aortic valve is tricuspid. Aortic valve regurgitation is not visualized. Pulmonic Valve: The pulmonic  valve was normal in structure. Pulmonic valve regurgitation is not visualized. Aorta: The aortic root and ascending aorta are structurally normal, with no evidence of dilitation. Venous: The inferior vena cava is normal in size with greater than 50% respiratory variability, suggesting right atrial pressure of 3 mmHg. IAS/Shunts: No atrial level shunt detected by color flow Doppler.  LEFT VENTRICLE PLAX 2D                        Biplane EF (MOD) LVIDd:         3.80 cm         LV Biplane EF:   Left LVIDs:         2.40 cm                          ventricular LV PW:         1.10 cm                          ejection LV IVS:        0.80 cm                          fraction by LVOT diam:     2.30 cm  2D MOD LV SV:         90                               biplane is LV SV Index:   55                               60.6 %. LVOT Area:     4.15 cm                                Diastology                                LV e' medial:    8.70 cm/s LV Volumes (MOD)               LV E/e' medial:  13.4 LV vol d, MOD    96.3 ml       LV e' lateral:   11.90 cm/s A2C:                           LV E/e' lateral: 9.8 LV vol d, MOD    90.4 ml A4C: LV vol s, MOD    41.7 ml A2C: LV vol s, MOD    38.0 ml A4C: LV SV MOD A2C:   54.6 ml LV SV MOD A4C:   90.4 ml LV SV MOD BP:    62.1 ml RIGHT VENTRICLE             IVC RV S prime:     16.00 cm/s  IVC diam: 1.80 cm TAPSE (M-mode): 1.8 cm LEFT ATRIUM             Index        RIGHT ATRIUM           Index LA diam:        3.60 cm 2.19 cm/m   RA Area:     13.50 cm LA Vol (A2C):   69.4 ml 42.19 ml/m  RA Volume:   36.00 ml  21.89 ml/m LA Vol (A4C):   27.6 ml 16.78 ml/m LA Biplane Vol: 46.0 ml 27.97 ml/m  AORTIC VALVE LVOT Vmax:   135.00 cm/s LVOT Vmean:  90.700 cm/s LVOT VTI:    0.216 m  AORTA Ao Root diam: 3.10 cm Ao Asc diam:  3.60 cm MITRAL VALVE                TRICUSPID VALVE MV Area (PHT): 4.15 cm     TR Peak grad:   40.2 mmHg MV Decel Time: 183 msec     TR Vmax:         317.00 cm/s MV E velocity: 116.67 cm/s MV A velocity: 96.57 cm/s   SHUNTS MV E/A ratio:  1.21         Systemic VTI:  0.22 m                             Systemic Diam: 2.30 cm Zoila Shutter MD Electronically signed by Zoila Shutter MD Signature Date/Time: 04/04/2022/5:27:58 PM    Final    DG Chest Port 1 View  Result Date: 04/02/2022  CLINICAL DATA:  Possible sepsis EXAM: PORTABLE CHEST 1 VIEW COMPARISON:  01/08/2022 FINDINGS: Cardiac size is within normal limits. There is decreased volume in the left lung. Linear densities are seen in left mid and left lower lung fields. Surgical staples are seen in left parahilar region. Left hemidiaphragm is elevated. There is blunting of left lateral CP angle. There is no pneumothorax. Tip of left subclavian chest port is seen in superior vena cava. IMPRESSION: Postsurgical changes are noted in left hemithorax. There are no new infiltrates or signs of pulmonary edema. Electronically Signed   By: Ernie Avena M.D.   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 H/o pancreatic exocrine insufficiency. 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 GI consulted for anemia, hemoccult positive stools.   Anemia/GI bleeding: stool hemoccult positive but recently reports intermittent melena. Hospitalized in 12/2021 for symptomatic anemia/occult GI bleeding, completed EGD/flex sig (completed to neoterminal ileum but poor  prep). His last chemo 12/2021. He has been on TPN for two weeks trying to prepare for surgical resection of pancreatic cancer. Chronically on Eliquis, last dose morning of 04/04/22. Mild decline in Hgb from baseline.   Plan:   Consider EGD prior to discharge. Will monitor for overt GI bleeding. Reassess in AM.  Trend H/H. PPI BID.    LOS: 2 days   We would like to thank you for the opportunity to participate in the care of [name].  Leanna Battles. [facility] [phone] 1/15/20248:00 AM

## 2022-04-05 NOTE — Progress Notes (Signed)
PROGRESS NOTE  [name] KIL:[id number] DOB: 02-23-1951 DOA: 04/03/2022 PCP: Romeo Rabon, MD  Brief History:  72 year old male with a history of pancreatic adenocarcinoma, colon adenocarcinoma status post resection May 2012, adenocarcinoma of the left lung status post resection, small bowel resection status post ileorectal anastomosis with short gut syndrome, iron deficiency anemia, pulmonary embolism on apixaban, hypertension, CKD stage III, GERD, remote tobacco and alcohol use (quit 2012) presenting with fevers, chills, and rigors of 5 days duration.  The patient initially visited the emergency department on 04/02/2021 with fevers, chills, and nausea.  Blood cultures were obtained during that visit.  He was given ceftriaxone, azithromycin, and IV fluids.  His BP improved and tachycardia improved and he was discharged home in stable condition.  He was called back to come to the emergency department because of positive blood cultures.  He continued to have nausea, fevers, chills.  He complains of generalized weakness and shortness of breath.  He denies any current emesis.  He states that he has chronic diarrhea, 4-5 bowel movements daily.  He denies hematochezia but has occasional melena.  He denies any headache, neck pain, coughing, hemoptysis. Notably, the patient was recently admitted to the hospital from 01/08/2022 to 01/11/2022 due to symptomatic anemia.  He was given 2 unit PRBC.  He will underwent EGD on 01/10/2022 which showed grade a reflux esophagitis without bleeding.  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  01/11/2022 flex sig showed patent side-to-side ileal colonic anastomosis with healthy-appearing mucosa.  There was diffuse moderate inflammation at the colonic anastomosis.  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  His apixaban was restarted on 01/11/2022. In the ED, the patient had a low-grade temperature of 99.8 F.  His blood pressure was soft  with systolic blood pressures in the 100s.  Oxygen saturation was 99% on room air.  WBC 4.2, hemoglobin 10.7, platelets 24,000.  Sodium 130, potassium 3.0, bicarbonate 22, serum creatinine 1.09.  Blood cultures are showing gram-positive cocci in 2 out of 2 sets.  The patient was started on vancomycin.   Assessment/Plan: Bacteremia-CoNS -04/02/2022 blood culture showing GPC 2 out of 2 sets - PCR shows Staphylococcus epidermidis -1/13 blood cultures 04/03/2022--Staph epi -Continue vancomycin -Patient seems a bit hesitant for excision of his Port-A-Cath if he would ultimately require -1/14 Echo EF 60-65%, G1DD, no vegetations, normal RVF -ID eval   Hyponatremia -Secondary to poor solute intake and volume depletion -Continue IV normal saline   Hypokalemia -Replete -Check magnesium--2.4  Hypophosphatemia -replete   Pancreatic adenocarcinoma -Status post FOLFIRINOX 10/16/2021>> 12/30/2021 -Patient was started on TPN 03/19/2022 to improve his nutritional status for pancreatic tail resection -Pancreatic tail resection and splenectomy had been put on hold secondary to his poor nutritional status   Symptomatic anemia/melena -Once again, the patient's hemoglobin has drifted down from 11.6 on 03/17/22 -04/04/2021 hemoglobin 7.7>>7.4 -GI consult -Patient does take iron supplementation -transfuse one unit PRBC   Iron deficiency anemia -04/20/2022 iron saturation 50   Pancytopenia due to antineoplastic chemotherapy (HCC) Last FOLFIRINOX 01/01/22 Serial CBC   CKD 3a -baseline creatinine 1.0-1.3   Chronic abdominal pain Patient has chronic left-sided abdominal pain Previously attributable to the patient's malignancy Unchanged in the past month 01/06/2022 MRI abdomen--area of restricted diffusion in the distal pancreas 2.0 x 1.9 cm.  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  Denies f/c, cp, n/v, sob, cough  Objective: Vitals:   04/04/22 0301 04/04/22 1559 04/04/22 2052 04/05/22 0531  BP: (!) 98/46 (!) 106/59 134/75 134/70  Pulse: 75 90 99 82  Resp: 18 18 18 18   Temp: 98.6 F (37 C) 99.2 F (37.3 C) 100.2 F (37.9 C) 98.4 F (36.9 C)  TempSrc: Oral Oral Oral Oral  SpO2: 99% 97% 99% 100%  Weight:      Height:        Intake/Output Summary (Last 24 hours) at 04/05/2022 0800 Last data filed at 04/05/2022 0317 Gross per 24 hour  Intake 1764.5 ml  Output 1400 ml  Net 364.5 ml   Weight change:  Exam:  General:  Pt is alert, follows commands appropriately, not in acute distress HEENT: No icterus, No thrush, No neck mass, [place]/AT Cardiovascular: RRR, S1/S2, no rubs, no gallops Respiratory: fine bibasilar crackles. No wheeze Abdomen: Soft/+BS, mild diffuse tender, non distended, no guarding Extremities: No edema, No lymphangitis, No petechiae, No rashes, no synovitis   Data Reviewed: I have personally reviewed following labs and imaging studies Basic Metabolic Panel: Recent Labs  Lab 04/02/22 1401 04/03/22 1623 04/04/22 0456 04/05/22 0524  NA 128* 130* 135 136  K 3.1* 3.0* 3.0* 4.2  CL 95* 102 107 109  CO2 23 22 20* 20*  GLUCOSE 103* 94 135* 123*  BUN 20 15 14 11   CREATININE 1.27* 1.09 1.01 0.98  CALCIUM 7.1* 6.8* 7.1* 7.1*  MG  --   --  2.4 2.1  PHOS  --   --  2.7 2.1*   Liver Function Tests: Recent Labs  Lab 04/02/22 1401  04/04/22 0456 04/05/22 0524  AST 38 27 19  ALT 36 24 20  ALKPHOS 80 73 69  BILITOT 0.5 0.2* 0.3  PROT 5.4* 4.5* 4.2*  ALBUMIN 1.9* 1.6* 1.5*   No results for input(s): "LIPASE", "AMYLASE" in the last 168 hours. No results for input(s): "AMMONIA" in the last 168 hours. Coagulation Profile: Recent Labs  Lab 04/02/22 1401  INR 1.5*   CBC: Recent Labs  Lab 04/02/22 1401 04/03/22 1623 04/04/22 0456 04/05/22 0524  WBC 3.5* 4.2 4.4 5.5  NEUTROABS 2.9 3.2  --   --   HGB 9.0* 7.7* 7.7* 7.4*  HCT 27.1* 23.3* 23.3* 22.6*  MCV 93.4 92.8 92.8 93.8  PLT 81* 74* 69* 71*   Cardiac Enzymes: No results for input(s): "CKTOTAL", "CKMB", "CKMBINDEX", "TROPONINI" in the last 168 hours. BNP: Invalid input(s): "POCBNP" CBG: Recent Labs  Lab 04/05/22 0109 04/05/22 0558  GLUCAP 139* 116*   HbA1C: No results for input(s): "HGBA1C" in the last 72 hours. Urine analysis:    Component Value Date/Time   COLORURINE YELLOW 04/02/2022 1533   APPEARANCEUR HAZY (A) 04/02/2022 1533   LABSPEC 1.013 04/02/2022 1533   PHURINE 5.0 04/02/2022 1533   GLUCOSEU NEGATIVE  04/02/2022 1533   HGBUR NEGATIVE 04/02/2022 1533   BILIRUBINUR NEGATIVE 04/02/2022 1533   KETONESUR NEGATIVE 04/02/2022 1533   PROTEINUR 30 (A) 04/02/2022 1533   UROBILINOGEN 0.2 11/30/2014 1115   NITRITE NEGATIVE 04/02/2022 1533   LEUKOCYTESUR NEGATIVE 04/02/2022 1533   Sepsis Labs: @LABRCNTIP (procalcitonin:4,lacticidven:4) ) Recent Results (from the past 240 hour(s))  Resp panel by RT-PCR (RSV, Flu A&B, Covid) Anterior Nasal Swab     Status: None   Collection Time: 04/02/22 12:36 PM   Specimen: Anterior Nasal Swab  Result Value Ref Range Status   SARS Coronavirus 2 by RT PCR NEGATIVE NEGATIVE Final    Comment: (NOTE) SARS-CoV-2 target nucleic acids are NOT DETECTED.  The SARS-CoV-2 RNA is generally detectable in upper respiratory specimens during the acute phase of infection. The lowest concentration of SARS-CoV-2 viral  copies this assay can detect is 138 copies/mL. A negative result does not preclude SARS-Cov-2 infection and should not be used as the sole basis for treatment or other patient management decisions. A negative result may occur with  improper specimen collection/handling, submission of specimen other than nasopharyngeal swab, presence of viral mutation(s) within the areas targeted by this assay, and inadequate number of viral copies(<138 copies/mL). A negative result must be combined with clinical observations, patient history, and epidemiological information. The expected result is Negative.  Fact Sheet for Patients:  06/01/22  Fact Sheet for Healthcare Providers:  BloggerCourse.com  This test is no t yet approved or cleared by the [employer] FDA and  has been authorized for detection and/or diagnosis of SARS-CoV-2 by FDA under an Emergency Use Authorization (EUA). This EUA will remain  in effect (meaning this test can be used) for the duration of the COVID-19 declaration under Section 564(b)(1) of the Act, 21 U.S.C.section 360bbb-3(b)(1), unless the authorization is terminated  or revoked sooner.       Influenza A by PCR NEGATIVE NEGATIVE Final   Influenza B by PCR NEGATIVE NEGATIVE Final    Comment: (NOTE) The Xpert Xpress SARS-CoV-2/FLU/RSV plus assay is intended as an aid in the diagnosis of influenza from Nasopharyngeal swab specimens and should not be used as a sole basis for treatment. Nasal washings and aspirates are unacceptable for Xpert Xpress SARS-CoV-2/FLU/RSV testing.  Fact Sheet for Patients: [place]  Fact Sheet for Healthcare Providers: BloggerCourse.com  This test is not yet approved or cleared by the [employer] FDA and has been authorized for detection and/or diagnosis of SARS-CoV-2 by FDA under an Emergency Use Authorization (EUA). This  EUA will remain in effect (meaning this test can be used) for the duration of the COVID-19 declaration under Section 564(b)(1) of the Act, 21 U.S.C. section 360bbb-3(b)(1), unless the authorization is terminated or revoked.     Resp Syncytial Virus by PCR NEGATIVE NEGATIVE Final    Comment: (NOTE) Fact Sheet for Patients: [place]  Fact Sheet for Healthcare Providers: BloggerCourse.com  This test is not yet approved or cleared by the [employer] FDA and has been authorized for detection and/or diagnosis of SARS-CoV-2 by FDA under an Emergency Use Authorization (EUA). This EUA will remain in effect (meaning this test can be used) for the duration of the COVID-19 declaration under Section 564(b)(1) of the Act, 21 U.S.C. section 360bbb-3(b)(1), unless the authorization is terminated or revoked.  Performed at [facility], [address] [place], [place]   Culture, blood (Routine x 2)     Status: Abnormal (Preliminary result)   Collection Time: 04/02/22  2:01 PM  Specimen: Left Antecubital; Blood  Result Value Ref Range Status   Specimen Description   Final    LEFT ANTECUBITAL BOTTLES DRAWN AEROBIC AND ANAEROBIC Performed at [facility], [address], [place], [place] [address]    Special Requests   Final    Blood Culture adequate volume Performed at [facility], [address], [place], [place] [address]    Culture  Setup Time   Final    GRAM POSITIVE COCCI ANAEROBIC BOTTLE ONLY Gram Stain Report Called to,Read Back By and Verified With: WHITE M @ 0838 ON [id number] BY HENDERSON L GRAM POSITIVE COCCI AEROBIC BOTTLE ONLY CRITICAL RESULT CALLED TO, READ BACK BY AND VERIFIED WITH:  C/ M. WHITE, RN 04/03/22 1400 A. LAFRANCE    Culture (A)  Final    STAPHYLOCOCCUS EPIDERMIDIS STAPHYLOCOCCUS HOMINIS CULTURE REINCUBATED FOR BETTER GROWTH Performed at [facility], [address], [place], [place]  [address]    Report Status PENDING  Incomplete  Culture, blood (Routine x 2)     Status: Abnormal (Preliminary result)   Collection Time: 04/02/22  2:01 PM   Specimen: BLOOD LEFT HAND  Result Value Ref Range Status   Specimen Description   Final    BLOOD LEFT HAND BOTTLES DRAWN AEROBIC ONLY Performed at [facility], [address] [place], [place] [address]    Special Requests   Final    Blood Culture adequate volume Performed at [facility], [address] [place], [place] [address]    Culture  Setup Time   Final    GRAM POSITIVE COCCI AEROBIC BOTTLE ONLY Gram Stain Report Called to,Read Back By and Verified With: WHITE M @ 1334 ON [id number] BY HENDERSON L CRITICAL VALUE NOTED.  VALUE IS CONSISTENT WITH PREVIOUSLY REPORTED AND CALLED VALUE. Performed at [facility], [address], [place], [place] [address]    Culture (A)  Final    STAPHYLOCOCCUS EPIDERMIDIS STAPHYLOCOCCUS HOMINIS    Report Status PENDING  Incomplete  Blood Culture ID Panel (Reflexed)     Status: Abnormal   Collection Time: 04/02/22  2:01 PM  Result Value Ref Range Status   Enterococcus faecalis NOT DETECTED NOT DETECTED Final   Enterococcus Faecium NOT DETECTED NOT DETECTED Final   Listeria monocytogenes NOT DETECTED NOT DETECTED Final   Staphylococcus species DETECTED (A) NOT DETECTED Final    Comment: CRITICAL RESULT CALLED TO, READ BACK BY AND VERIFIED WITH:  C/ M. WHITE, RN 04/03/22 1400 A. LAFRANCE    Staphylococcus aureus (BCID) NOT DETECTED NOT DETECTED Final   Staphylococcus epidermidis DETECTED (A) NOT DETECTED Final    Comment: Methicillin (oxacillin) resistant coagulase negative staphylococcus. Possible blood culture contaminant (unless isolated from more than one blood culture draw or clinical case suggests pathogenicity). No antibiotic treatment is indicated for blood  culture contaminants. CRITICAL RESULT CALLED TO, READ BACK BY AND VERIFIED WITH:  C/ M. WHITE, RN 04/03/22 1400 A.  LAFRANCE    Staphylococcus lugdunensis NOT DETECTED NOT DETECTED Final   Streptococcus species NOT DETECTED NOT DETECTED Final   Streptococcus agalactiae NOT DETECTED NOT DETECTED Final   Streptococcus pneumoniae NOT DETECTED NOT DETECTED Final   Streptococcus pyogenes NOT DETECTED NOT DETECTED Final   A.calcoaceticus-baumannii NOT DETECTED NOT DETECTED Final   Bacteroides fragilis NOT DETECTED NOT DETECTED Final   Enterobacterales NOT DETECTED NOT DETECTED Final   Enterobacter cloacae complex NOT DETECTED NOT DETECTED Final   Escherichia coli NOT DETECTED NOT DETECTED Final   Klebsiella aerogenes NOT  DETECTED NOT DETECTED Final   Klebsiella oxytoca NOT DETECTED NOT DETECTED Final   Klebsiella pneumoniae NOT DETECTED NOT DETECTED Final   Proteus species NOT DETECTED NOT DETECTED Final   Salmonella species NOT DETECTED NOT DETECTED Final   Serratia marcescens NOT DETECTED NOT DETECTED Final   Haemophilus influenzae NOT DETECTED NOT DETECTED Final   Neisseria meningitidis NOT DETECTED NOT DETECTED Final   Pseudomonas aeruginosa NOT DETECTED NOT DETECTED Final   Stenotrophomonas maltophilia NOT DETECTED NOT DETECTED Final   Candida albicans NOT DETECTED NOT DETECTED Final   Candida auris NOT DETECTED NOT DETECTED Final   Candida glabrata NOT DETECTED NOT DETECTED Final   Candida krusei NOT DETECTED NOT DETECTED Final   Candida parapsilosis NOT DETECTED NOT DETECTED Final   Candida tropicalis NOT DETECTED NOT DETECTED Final   Cryptococcus neoformans/gattii NOT DETECTED NOT DETECTED Final   Methicillin resistance mecA/C DETECTED (A) NOT DETECTED Final    Comment: CRITICAL RESULT CALLED TO, READ BACK BY AND VERIFIED WITH:  C/ M. WHITE, RN 04/03/22 1400 A. LAFRANCE Performed at [facility], [place] [address], [place], [place] [address]   Urine Culture     Status: None   Collection Time: 04/02/22  3:33 PM   Specimen: Urine, Catheterized  Result Value Ref Range Status   Specimen  Description   Final    URINE, CATHETERIZED Performed at [facility], [address] [place], [place] [address]    Special Requests   Final    NONE Performed at [facility], [address], [place], [place] [address]    Culture   Final    NO GROWTH Performed at [facility], [place] [address] [place], [place] [address]    Report Status 04/04/2022 FINAL  Final  Blood culture (routine x 2)     Status: None (Preliminary result)   Collection Time: 04/03/22  4:23 PM   Specimen: Left Antecubital; Blood  Result Value Ref Range Status   Specimen Description   Final    LEFT ANTECUBITAL BOTTLES DRAWN AEROBIC AND ANAEROBIC Performed at [facility], [address], [place], [place] [address]    Special Requests   Final    Blood Culture adequate volume Performed at [facility], [address] [place], [place] [address]    Culture  Setup Time   Final    GRAM POSITIVE COCCI Gram Stain Report Called to,Read Back By and Verified With: T. Hollice Espy  @ [id number] BY STEPHTR  04/04/22 IN BOTH AEROBIC AND ANAEROBIC BOTTLES CRITICAL VALUE NOTED.  VALUE IS CONSISTENT WITH PREVIOUSLY REPORTED AND CALLED VALUE. Performed at [facility], [place] [address], [place], [place] [address]    Culture   Final    NO GROWTH 2 DAYS Performed at [facility], [address], [place], [place] [address]    Report Status PENDING  Incomplete  Blood culture (routine x 2)     Status: None (Preliminary result)   Collection Time: 04/03/22  4:28 PM   Specimen: BLOOD RIGHT HAND  Result Value Ref Range Status   Specimen Description   Final    BLOOD RIGHT HAND BOTTLES DRAWN AEROBIC AND ANAEROBIC Performed at [facility], [address] [place], [place] [address]    Special Requests   Final    Blood Culture adequate volume Performed at [facility], [address] [place], [place] [address]    Culture  Setup Time   Final    GRAM POSITIVE COCCI Gram Stain Report  Called to,Read Back By and Verified With:  T. LEONARD  @ [phone] BY STEPHTR 04/04/22 IN BOTH AEROBIC AND ANAEROBIC BOTTLES Organism ID to follow CRITICAL RESULT CALLED TO, READ BACK BY AND VERIFIED WITH:  Perlie Gold, RN 04/04/22 1552 A. LAFRANCE Performed at [facility], [address] [place], [place] [address]    Culture GRAM POSITIVE COCCI  Final   Report Status PENDING  Incomplete  Blood Culture ID Panel (Reflexed)     Status: Abnormal   Collection Time: 04/03/22  4:28 PM  Result Value Ref Range Status   Enterococcus faecalis NOT DETECTED NOT DETECTED Final   Enterococcus Faecium NOT DETECTED NOT DETECTED Final   Listeria monocytogenes NOT DETECTED NOT DETECTED Final   Staphylococcus species DETECTED (A) NOT DETECTED Final    Comment: CRITICAL RESULT CALLED TO, READ BACK BY AND VERIFIED WITH:  Perlie Gold, RN 04/04/22 1552 A. LAFRANCE    Staphylococcus aureus (BCID) NOT DETECTED NOT DETECTED Final   Staphylococcus epidermidis DETECTED (A) NOT DETECTED Final    Comment: Methicillin (oxacillin) resistant coagulase negative staphylococcus. Possible blood culture contaminant (unless isolated from more than one blood culture draw or clinical case suggests pathogenicity). No antibiotic treatment is indicated for blood  culture contaminants. CRITICAL RESULT CALLED TO, READ BACK BY AND VERIFIED WITH:  Perlie Gold, RN 04/04/22 1552 A. LAFRANCE    Staphylococcus lugdunensis NOT DETECTED NOT DETECTED Final   Streptococcus species NOT DETECTED NOT DETECTED Final   Streptococcus agalactiae NOT DETECTED NOT DETECTED Final   Streptococcus pneumoniae NOT DETECTED NOT DETECTED Final   Streptococcus pyogenes NOT DETECTED NOT DETECTED Final   A.calcoaceticus-baumannii NOT DETECTED NOT DETECTED Final   Bacteroides fragilis NOT DETECTED NOT DETECTED Final   Enterobacterales NOT DETECTED NOT DETECTED Final   Enterobacter cloacae complex NOT DETECTED NOT DETECTED Final   Escherichia coli NOT DETECTED NOT DETECTED Final   Klebsiella aerogenes  NOT DETECTED NOT DETECTED Final   Klebsiella oxytoca NOT DETECTED NOT DETECTED Final   Klebsiella pneumoniae NOT DETECTED NOT DETECTED Final   Proteus species NOT DETECTED NOT DETECTED Final   Salmonella species NOT DETECTED NOT DETECTED Final   Serratia marcescens NOT DETECTED NOT DETECTED Final   Haemophilus influenzae NOT DETECTED NOT DETECTED Final   Neisseria meningitidis NOT DETECTED NOT DETECTED Final   Pseudomonas aeruginosa NOT DETECTED NOT DETECTED Final   Stenotrophomonas maltophilia NOT DETECTED NOT DETECTED Final   Candida albicans NOT DETECTED NOT DETECTED Final   Candida auris NOT DETECTED NOT DETECTED Final   Candida glabrata NOT DETECTED NOT DETECTED Final   Candida krusei NOT DETECTED NOT DETECTED Final   Candida parapsilosis NOT DETECTED NOT DETECTED Final   Candida tropicalis NOT DETECTED NOT DETECTED Final   Cryptococcus neoformans/gattii NOT DETECTED NOT DETECTED Final   Methicillin resistance mecA/C DETECTED (A) NOT DETECTED Final    Comment: CRITICAL RESULT CALLED TO, READ BACK BY AND VERIFIED WITH:  Perlie Gold, RN 04/04/22 1552 A. LAFRANCE Performed at [facility], [address] [place], [place] [address]   Culture, blood (Routine X 2) w Reflex to ID Panel     Status: None (Preliminary result)   Collection Time: 04/04/22 10:20 AM   Specimen: BLOOD RIGHT ARM  Result Value Ref Range Status   Specimen Description   Final    BLOOD RIGHT ARM BOTTLES DRAWN AEROBIC AND ANAEROBIC   Special Requests   Final    Blood Culture results may not be optimal  due to an excessive volume of blood received in culture bottles   Culture   Final    NO GROWTH < 24 HOURS Performed at [facility], [address], [place], [place] [address]    Report Status PENDING  Incomplete  Culture, blood (Routine X 2) w Reflex to ID Panel     Status: None (Preliminary result)   Collection Time: 04/04/22 10:20 AM   Specimen: BLOOD RIGHT HAND  Result Value Ref Range Status    Specimen Description   Final    BLOOD RIGHT HAND BOTTLES DRAWN AEROBIC AND ANAEROBIC   Special Requests   Final    Blood Culture results may not be optimal due to an excessive volume of blood received in culture bottles   Culture   Final    NO GROWTH < 24 HOURS Performed at [facility], [address], [place], [place] [address]    Report Status PENDING  Incomplete     Scheduled Meds:  sodium chloride   Intravenous Once   acidophilus  1 capsule Oral Daily   atorvastatin  20 mg Oral Daily   Chlorhexidine Gluconate Cloth  6 each Topical Daily   cholestyramine light  4 g Oral Daily   dicyclomine  10 mg Oral TID AC & HS   diphenoxylate-atropine  1 tablet Oral QID   escitalopram  20 mg Oral Daily   gabapentin  100 mg Oral QHS   insulin aspart  0-9 Units Subcutaneous Q6H   lipase/protease/amylase  108,000 Units Oral TID WC   megestrol  400 mg Oral BID   mirtazapine  30 mg Oral QHS   pantoprazole  40 mg Oral BID AC   phosphorus  500 mg Oral Daily   Ensure Max Protein  11 oz Oral BID BM   Continuous Infusions:  0.9 % NaCl with KCl 40 mEq / L 25 mL/hr at 04/05/22 0601   TPN ADULT (ION) 50 mL/hr at 04/04/22 1800   vancomycin 750 mg (04/04/22 2114)    Procedures/Studies: ECHOCARDIOGRAM COMPLETE  Result Date: 04/04/2022    ECHOCARDIOGRAM REPORT   Patient Name:   [name] Date of Exam: 04/04/2022 Medical Rec #:  [id number]        Height:       68.0 in Accession #:    [id number]       Weight:       120.0 lb Date of Birth:  Sep 18, 1950       BSA:          1.645 m Patient Age:    71 years         BP:           95/46 mmHg Patient Gender: M                HR:           91 bpm. Exam Location:  Inpatient Procedure: 2D Echo, Cardiac Doppler and Color Doppler Indications:    Bacteremia  History:        Patient has prior history of Echocardiogram examinations, most                 recent 03/20/2016. Signs/Symptoms:Fever; Risk                 Factors:Hypertension. Cancer. Pulmonary embolus 2012.   Sonographer:    Sheralyn Boatman RDCS Referring Phys: [phone] Lyah Millirons IMPRESSIONS  1. Left ventricular ejection fraction, by estimation, is 60 to 65%. Left ventricular ejection  fraction by 2D MOD biplane is 60.6 %. The left ventricle has normal function. The left ventricle has no regional wall motion abnormalities. Left ventricular diastolic parameters are consistent with Grade I diastolic dysfunction (impaired relaxation).  2. Right ventricular systolic function is normal. The right ventricular size is normal. There is mildly elevated pulmonary artery systolic pressure. The estimated right ventricular systolic pressure is 43.2 mmHg.  3. The mitral valve is grossly normal. Trivial mitral valve regurgitation.  4. The aortic valve is tricuspid. Aortic valve regurgitation is not visualized.  5. The inferior vena cava is normal in size with greater than 50% respiratory variability, suggesting right atrial pressure of 3 mmHg. Conclusion(s)/Recommendation(s): No evidence of valvular vegetations on this transthoracic echocardiogram. Consider a transesophageal echocardiogram to exclude infective endocarditis if clinically indicated. FINDINGS  Left Ventricle: Left ventricular ejection fraction, by estimation, is 60 to 65%. Left ventricular ejection fraction by 2D MOD biplane is 60.6 %. The left ventricle has normal function. The left ventricle has no regional wall motion abnormalities. The left ventricular internal cavity size was normal in size. There is no left ventricular hypertrophy. Left ventricular diastolic parameters are consistent with Grade I diastolic dysfunction (impaired relaxation). Indeterminate filling pressures. Right Ventricle: The right ventricular size is normal. No increase in right ventricular wall thickness. Right ventricular systolic function is normal. There is mildly elevated pulmonary artery systolic pressure. The tricuspid regurgitant velocity is 3.17  m/s, and with an assumed right atrial pressure of 3  mmHg, the estimated right ventricular systolic pressure is 43.2 mmHg. Left Atrium: Left atrial size was normal in size. Right Atrium: Right atrial size was normal in size. Pericardium: There is no evidence of pericardial effusion. Mitral Valve: The mitral valve is grossly normal. Trivial mitral valve regurgitation. Tricuspid Valve: The tricuspid valve is grossly normal. Tricuspid valve regurgitation is mild. Aortic Valve: The aortic valve is tricuspid. Aortic valve regurgitation is not visualized. Pulmonic Valve: The pulmonic valve was normal in structure. Pulmonic valve regurgitation is not visualized. Aorta: The aortic root and ascending aorta are structurally normal, with no evidence of dilitation. Venous: The inferior vena cava is normal in size with greater than 50% respiratory variability, suggesting right atrial pressure of 3 mmHg. IAS/Shunts: No atrial level shunt detected by color flow Doppler.  LEFT VENTRICLE PLAX 2D                        Biplane EF (MOD) LVIDd:         3.80 cm         LV Biplane EF:   Left LVIDs:         2.40 cm                          ventricular LV PW:         1.10 cm                          ejection LV IVS:        0.80 cm                          fraction by LVOT diam:     2.30 cm                          2D MOD LV SV:  90                               biplane is LV SV Index:   55                               60.6 %. LVOT Area:     4.15 cm                                Diastology                                LV e' medial:    8.70 cm/s LV Volumes (MOD)               LV E/e' medial:  13.4 LV vol d, MOD    96.3 ml       LV e' lateral:   11.90 cm/s A2C:                           LV E/e' lateral: 9.8 LV vol d, MOD    90.4 ml A4C: LV vol s, MOD    41.7 ml A2C: LV vol s, MOD    38.0 ml A4C: LV SV MOD A2C:   54.6 ml LV SV MOD A4C:   90.4 ml LV SV MOD BP:    62.1 ml RIGHT VENTRICLE             IVC RV S prime:     16.00 cm/s  IVC diam: 1.80 cm TAPSE (M-mode): 1.8 cm LEFT ATRIUM              Index        RIGHT ATRIUM           Index LA diam:        3.60 cm 2.19 cm/m   RA Area:     13.50 cm LA Vol (A2C):   69.4 ml 42.19 ml/m  RA Volume:   36.00 ml  21.89 ml/m LA Vol (A4C):   27.6 ml 16.78 ml/m LA Biplane Vol: 46.0 ml 27.97 ml/m  AORTIC VALVE LVOT Vmax:   135.00 cm/s LVOT Vmean:  90.700 cm/s LVOT VTI:    0.216 m  AORTA Ao Root diam: 3.10 cm Ao Asc diam:  3.60 cm MITRAL VALVE                TRICUSPID VALVE MV Area (PHT): 4.15 cm     TR Peak grad:   40.2 mmHg MV Decel Time: 183 msec     TR Vmax:        317.00 cm/s MV E velocity: 116.67 cm/s MV A velocity: 96.57 cm/s   SHUNTS MV E/A ratio:  1.21         Systemic VTI:  0.22 m                             Systemic Diam: 2.30 cm Zoila Shutter MD Electronically signed by Zoila Shutter MD Signature Date/Time: 04/04/2022/5:27:58 PM    Final    DG Chest Port 1 View  Result Date: 04/02/2022 CLINICAL DATA:  Possible sepsis EXAM: PORTABLE CHEST 1 VIEW COMPARISON:  01/08/2022 FINDINGS: Cardiac size is within normal limits. There is decreased volume in the left lung. Linear densities are seen in left mid and left lower lung fields. Surgical staples are seen in left parahilar region. Left hemidiaphragm is elevated. There is blunting of left lateral CP angle. There is no pneumothorax. Tip of left subclavian chest port is seen in superior vena cava. IMPRESSION: Postsurgical changes are noted in left hemithorax. There are no new infiltrates or signs of pulmonary edema. Electronically Signed   By: Ernie Avena M.D.   On: 04/02/2022 13:21    Catarina Hartshorn, DO  [facility]  If 7PM-7AM, please contact night-coverage [email or web address] Password [email or web address] 04/05/2022, 8:00 AM   LOS: 2 days

## 2022-04-06 ENCOUNTER — Other Ambulatory Visit: Payer: Self-pay

## 2022-04-06 DIAGNOSIS — B957 Other staphylococcus as the cause of diseases classified elsewhere: Secondary | ICD-10-CM | POA: Diagnosis not present

## 2022-04-06 DIAGNOSIS — N1831 Chronic kidney disease, stage 3a: Secondary | ICD-10-CM | POA: Diagnosis not present

## 2022-04-06 DIAGNOSIS — K921 Melena: Secondary | ICD-10-CM | POA: Diagnosis not present

## 2022-04-06 DIAGNOSIS — D61810 Antineoplastic chemotherapy induced pancytopenia: Secondary | ICD-10-CM | POA: Diagnosis not present

## 2022-04-06 DIAGNOSIS — R7881 Bacteremia: Secondary | ICD-10-CM | POA: Diagnosis not present

## 2022-04-06 LAB — CULTURE, BLOOD (ROUTINE X 2)
Special Requests: ADEQUATE
Special Requests: ADEQUATE

## 2022-04-06 LAB — GLUCOSE, CAPILLARY
Glucose-Capillary: 108 mg/dL — ABNORMAL HIGH (ref 70–99)
Glucose-Capillary: 138 mg/dL — ABNORMAL HIGH (ref 70–99)
Glucose-Capillary: 101 mg/dL — ABNORMAL HIGH (ref 70–99)
Glucose-Capillary: 129 mg/dL — ABNORMAL HIGH (ref 70–99)
Glucose-Capillary: 135 mg/dL — ABNORMAL HIGH (ref 70–99)
Glucose-Capillary: 96 mg/dL (ref 70–99)

## 2022-04-06 LAB — PHOSPHORUS: Phosphorus: 3.1 mg/dL (ref 2.5–4.6)

## 2022-04-06 LAB — BPAM RBC
Blood Product Expiration Date: 202402192359
ISSUE DATE / TIME: 202401151312
Unit Type and Rh: 5100

## 2022-04-06 LAB — TYPE AND SCREEN
ABO/RH(D): O POS
Antibody Screen: NEGATIVE
Unit division: 0

## 2022-04-06 LAB — BASIC METABOLIC PANEL
Anion gap: 5 (ref 5–15)
BUN: 11 mg/dL (ref 8–23)
CO2: 23 mmol/L (ref 22–32)
Calcium: 7.2 mg/dL — ABNORMAL LOW (ref 8.9–10.3)
Chloride: 106 mmol/L (ref 98–111)
Creatinine, Ser: 1.03 mg/dL (ref 0.61–1.24)
GFR, Estimated: >60 mL/min (ref >60)
Glucose, Bld: 118 mg/dL — ABNORMAL HIGH (ref 70–99)
Potassium: 4.3 mmol/L (ref 3.5–5.1)
Sodium: 134 mmol/L — ABNORMAL LOW (ref 135–145)

## 2022-04-06 LAB — CBC
HCT: 24.7 % — ABNORMAL LOW (ref 39.0–52.0)
Hemoglobin: 8.2 g/dL — ABNORMAL LOW (ref 13.0–17.0)
MCH: 30.9 pg (ref 26.0–34.0)
MCHC: 33.2 g/dL (ref 30.0–36.0)
MCV: 93.2 fL (ref 80.0–100.0)
Platelets: 96 10*3/uL — ABNORMAL LOW (ref 150–400)
RBC: 2.65 MIL/uL — ABNORMAL LOW (ref 4.22–5.81)
RDW: 15.4 % (ref 11.5–15.5)
WBC: 7.9 10*3/uL (ref 4.0–10.5)
nRBC: 0 % (ref 0.0–0.2)

## 2022-04-06 LAB — MAGNESIUM: Magnesium: 1.7 mg/dL (ref 1.7–2.4)

## 2022-04-06 MED ORDER — INSULIN ASPART 100 UNIT/ML IJ SOLN
0.0000 [IU] | Freq: Three times a day (TID) | INTRAMUSCULAR | Status: DC
  Administered 2022-04-06 – 2022-04-07 (×2): 1 [IU] via SUBCUTANEOUS

## 2022-04-06 MED ORDER — MAGNESIUM SULFATE 2 GM/50ML IV SOLN
2.0000 g | Freq: Once | INTRAVENOUS | Status: AC
  Administered 2022-04-06: 2 g via INTRAVENOUS
  Filled 2022-04-06: qty 50

## 2022-04-06 MED ORDER — TRAVASOL 10 % IV SOLN
INTRAVENOUS | Status: AC
  Filled 2022-04-06: qty 780

## 2022-04-06 NOTE — Progress Notes (Addendum)
PHARMACY - TOTAL PARENTERAL NUTRITION CONSULT NOTE   Indication:  decreased nutrition  started 03/19/22  Patient Measurements: Height: 5\' 8"  (172.7 cm) Weight: 54.4 kg (120 lb) IBW/kg (Calculated) : 68.4 TPN AdjBW (KG): 54.4 Body mass index is 18.25 kg/m.   Assessment:  Patient started on TPN 03/19/22. 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 Minimal intake orally. Pharmacy asked to continue TPN. (obtained patients TPN formula from [facility], [place]=>adjusted as needed per patient labs. )  Glucose / Insulin:  120-175/ 3 unit Electrolytes: K 4.2  corrected calcium 9.0 Mag 1.7  Phos 2.1> 3.1 Renal: WNL Hepatic: Albumin 1.6 Intake / Output; MIVF: NS w/ Kcl 40 mEq/L @ 25 mL/hr   Central access: implanted port  TPN start date: 03/19/22   Nutritional Goals: Increase nutrition for surgery   RD Assessment: Estimated Needs Total Energy Estimated Needs: 1900-2100 kcal Total Protein Estimated Needs: 95-115 grams Total Fluid Estimated Needs: >/= 2.3 L/daypending  Current Nutrition:  Current TPN provides: CHO 234 kcal, Prot 78g, Lipids 390Kcal. Total Kcal 1497 Kcal/day  Plan:  Continue TPN at 65 mL/hr at 1800-  Electrolytes in TPN: Na 31mEq/L, K 15mEq/L, Ca 69mEq/L, Mg 73mEq/L, and Phos 84mmol/L. Maximize acetate Add standard MVI and trace elements to TPN Initiate Sensitive q8h SSI and adjust as needed   MIVF at 25 mL/hr at 1800 >Monitor TPN labs on Mon/Thurs,   12m, BS Pharm D, BCPS Clinical Pharmacist 04/06/2022 7:49 AM

## 2022-04-06 NOTE — Progress Notes (Signed)
PROGRESS NOTE  JIA MOHAMED XFQ:[id number] DOB: [birth year] DOA: 04/03/2022 PCP: Romeo Rabon, MD  Brief History:  72 year old male with a history of pancreatic adenocarcinoma, colon adenocarcinoma status post resection May 2012, adenocarcinoma of the left lung status post resection, small bowel resection status post ileorectal anastomosis with short gut syndrome, iron deficiency anemia, pulmonary embolism on apixaban, hypertension, CKD stage III, GERD, remote tobacco and alcohol use (quit 2012) presenting with fevers, chills, and rigors of 5 days duration.  The patient initially visited the emergency department on 04/02/2021 with fevers, chills, and nausea.  Blood cultures were obtained during that visit.  He was given ceftriaxone, azithromycin, and IV fluids.  His BP improved and tachycardia improved and he was discharged home in stable condition.  He was called back to come to the emergency department because of positive blood cultures.  He continued to have nausea, fevers, chills.  He complains of generalized weakness and shortness of breath.  He denies any current emesis.  He states that he has chronic diarrhea, 4-5 bowel movements daily.  He denies hematochezia but has occasional melena.  He denies any headache, neck pain, coughing, hemoptysis. Notably, the patient was recently admitted to the hospital from 01/08/2022 to 01/11/2022 due to symptomatic anemia.  He was given 2 unit PRBC.  He will underwent EGD on 01/10/2022 which showed grade a reflux esophagitis without bleeding.  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  01/11/2022 flex sig showed patent side-to-side ileal colonic anastomosis with healthy-appearing mucosa.  There was diffuse moderate inflammation at the colonic anastomosis.  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  His apixaban was restarted on 01/11/2022. In the ED, the patient had a low-grade temperature of 99.8 F.  His blood pressure was soft  with systolic blood pressures in the 100s.  Oxygen saturation was 99% on room air.  WBC 4.2, hemoglobin 10.7, platelets 24,000.  Sodium 130, potassium 3.0, bicarbonate 22, serum creatinine 1.09.  Blood cultures are showing gram-positive cocci in 2 out of 2 sets.  The patient was started on vancomycin.   Assessment/Plan: Bacteremia-CoNS -04/02/2022 blood culture--S epi and S hominis 2 out of 2 sets - 04/03/22 blood culture--S epi and S hominis -04/04/22 TTE--EF 60-65%, no vegetation -04/05/22 blood culture>>GPC -Continue vancomycin -Patient seems a bit hesitant for excision of his Port-A-Cath initially--now agrees -1/14 Echo EF 60-65%, G1DD, no vegetations, normal RVF -ID eval>>remove port-a-cath -general surgery consulted for port-a-cath removal -cardiology consulted for TEE  Symptomatic anemia/melena -Once again, the patient's hemoglobin has drifted down from 11.6 on 03/17/22 -04/04/2021 hemoglobin 7.7>>7.4 -GI consult appreciated>>tentative plan for EGD 04/07/22 -Patient does take iron supplementation -transfused one unit PRBC 04/04/22   Hyponatremia -Secondary to poor solute intake and volume depletion -Continue IV normal saline   Hypokalemia -Replete -Check magnesium--1.7   Hypophosphatemia -repleted   Pancreatic adenocarcinoma -Status post FOLFIRINOX 10/16/2021>> 12/30/2021 -Patient was started on TPN 03/19/2022 to improve his nutritional status for pancreatic tail resection -Pancreatic tail resection and splenectomy had been put on hold secondary to his poor nutritional status   Iron deficiency anemia -04/20/2022 iron saturation 50   Pancytopenia due to antineoplastic chemotherapy (HCC) Last FOLFIRINOX 01/01/22 Serial CBC   CKD 3a -baseline creatinine 1.0-1.3   Chronic abdominal pain Patient has chronic left-sided abdominal pain Previously attributable to the patient's malignancy Unchanged in the past month 01/06/2022 MRI abdomen--area of restricted diffusion in the  distal pancreas 2.0 x 1.9 cm.  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  No hematochezia, but has dark stools.  Denies f/,c cp, sob, nv/  Objective: Vitals:   04/05/22 2215 04/06/22 0106 04/06/22 0520 04/06/22 1434  BP: 133/68 114/61 (!) 110/58 116/62  Pulse: 100 99 93 83  Resp: 20 17 17 17   Temp: (!) 101.8 F (38.8 C) (!) 100.8 F (38.2 C) 99.5 F (37.5 C) 98.3 F (36.8 C)  TempSrc: Oral Oral Oral Oral  SpO2: 98% 97% 97% 98%  Weight:      Height:        Intake/Output Summary (Last 24 hours) at 04/06/2022 1713 Last data filed at 04/06/2022 1341 Gross per 24 hour  Intake 1702.08 ml  Output 1051 ml  Net 651.08 ml   Weight change:  Exam:  General:  Pt is alert, follows commands appropriately, not in acute distress HEENT: No icterus, No thrush, No neck mass, [place]/AT Cardiovascular: RRR, S1/S2, no rubs, no gallops Respiratory: bibasilar crackles.  Now wheeze Abdomen: Soft/+BS, non tender, non distended, no guarding Extremities: No edema, No lymphangitis, No petechiae, No rashes, no synovitis   Data Reviewed: I have personally reviewed following labs and imaging studies Basic Metabolic Panel: Recent Labs  Lab 04/02/22 1401 04/03/22 1623 04/04/22 0456 04/05/22 0524 04/06/22 0524  NA 128* 130* 135 136 134*  K 3.1* 3.0* 3.0* 4.2 4.3   CL 95* 102 107 109 106  CO2 23 22 20* 20* 23  GLUCOSE 103* 94 135* 123* 118*  BUN 20 15 14 11 11   CREATININE 1.27* 1.09 1.01 0.98 1.03  CALCIUM 7.1* 6.8* 7.1* 7.1* 7.2*  MG  --   --  2.4 2.1 1.7  PHOS  --   --  2.7 2.1* 3.1   Liver Function Tests: Recent Labs  Lab 04/02/22 1401 04/04/22 0456 04/05/22 0524  AST 38 27 19  ALT 36 24 20  ALKPHOS 80 73 69  BILITOT 0.5 0.2* 0.3  PROT 5.4* 4.5* 4.2*  ALBUMIN 1.9* 1.6* 1.5*   No results for input(s): "LIPASE", "AMYLASE" in the last 168 hours. No results for input(s): "AMMONIA" in the last 168 hours. Coagulation Profile: Recent Labs  Lab 04/02/22 1401  INR 1.5*   CBC: Recent Labs  Lab 04/02/22 1401 04/03/22 1623 04/04/22 0456 04/05/22 0524 04/06/22 0524  WBC 3.5* 4.2 4.4 5.5 7.9  NEUTROABS 2.9 3.2  --   --   --   HGB 9.0* 7.7* 7.7* 7.4* 8.2*  HCT 27.1* 23.3* 23.3* 22.6* 24.7*  MCV 93.4 92.8 92.8 93.8 93.2  PLT 81* 74* 69* 71* 96*   Cardiac Enzymes: No results for input(s): "CKTOTAL", "CKMB", "CKMBINDEX", "TROPONINI" in the last 168 hours. BNP: Invalid input(s): "POCBNP" CBG: Recent Labs  Lab 04/06/22 0409 04/06/22 0602 04/06/22 0758 04/06/22  1110 04/06/22 1557  GLUCAP 108* 138* 101* 129* 135*   HbA1C: No results for input(s): "HGBA1C" in the last 72 hours. Urine analysis:    Component Value Date/Time   COLORURINE YELLOW 04/02/2022 1533   APPEARANCEUR HAZY (A) 04/02/2022 1533   LABSPEC 1.013 04/02/2022 1533   PHURINE 5.0 04/02/2022 1533   GLUCOSEU NEGATIVE 04/02/2022 1533   HGBUR NEGATIVE 04/02/2022 1533   BILIRUBINUR NEGATIVE 04/02/2022 1533   KETONESUR NEGATIVE 04/02/2022 1533   PROTEINUR 30 (A) 04/02/2022 1533   UROBILINOGEN 0.2 11/30/2014 1115   NITRITE NEGATIVE 04/02/2022 1533   LEUKOCYTESUR NEGATIVE 04/02/2022 1533   Sepsis Labs: @LABRCNTIP (procalcitonin:4,lacticidven:4) ) Recent Results (from the past 240 hour(s))  Resp panel by RT-PCR (RSV, Flu A&B, Covid) Anterior Nasal Swab      Status: None   Collection Time: 04/02/22 12:36 PM   Specimen: Anterior Nasal Swab  Result Value Ref Range Status   SARS Coronavirus 2 by RT PCR NEGATIVE NEGATIVE Final    Comment: (NOTE) SARS-CoV-2 target nucleic acids are NOT DETECTED.  The SARS-CoV-2 RNA is generally detectable in upper respiratory specimens during the acute phase of infection. The lowest concentration of SARS-CoV-2 viral copies this assay can detect is 138 copies/mL. A negative result does not preclude SARS-Cov-2 infection and should not be used as the sole basis for treatment or other patient management decisions. A negative result may occur with  improper specimen collection/handling, submission of specimen other than nasopharyngeal swab, presence of viral mutation(s) within the areas targeted by this assay, and inadequate number of viral copies(<138 copies/mL). A negative result must be combined with clinical observations, patient history, and epidemiological information. The expected result is Negative.  Fact Sheet for Patients:  EntrepreneurPulse.com.au  Fact Sheet for Healthcare Providers:  IncredibleEmployment.be  This test is no t yet approved or cleared by the [place] FDA and  has been authorized for detection and/or diagnosis of SARS-CoV-2 by FDA under an Emergency Use Authorization (EUA). This EUA will remain  in effect (meaning this test can be used) for the duration of the COVID-19 declaration under Section 564(b)(1) of the Act, 21 U.S.C.section 360bbb-3(b)(1), unless the authorization is terminated  or revoked sooner.       Influenza A by PCR NEGATIVE NEGATIVE Final   Influenza B by PCR NEGATIVE NEGATIVE Final    Comment: (NOTE) The Xpert Xpress SARS-CoV-2/FLU/RSV plus assay is intended as an aid in the diagnosis of influenza from Nasopharyngeal swab specimens and should not be used as a sole basis for treatment. Nasal washings and aspirates are  unacceptable for Xpert Xpress SARS-CoV-2/FLU/RSV testing.  Fact Sheet for Patients: EntrepreneurPulse.com.au  Fact Sheet for Healthcare Providers: IncredibleEmployment.be  This test is not yet approved or cleared by the [place] FDA and has been authorized for detection and/or diagnosis of SARS-CoV-2 by FDA under an Emergency Use Authorization (EUA). This EUA will remain in effect (meaning this test can be used) for the duration of the COVID-19 declaration under Section 564(b)(1) of the Act, 21 U.S.C. section 360bbb-3(b)(1), unless the authorization is terminated or revoked.     Resp Syncytial Virus by PCR NEGATIVE NEGATIVE Final    Comment: (NOTE) Fact Sheet for Patients: EntrepreneurPulse.com.au  Fact Sheet for Healthcare Providers: IncredibleEmployment.be  This test is not yet approved or cleared by the [place] FDA and has been authorized for detection and/or diagnosis of SARS-CoV-2 by FDA under an Emergency Use Authorization (EUA). This EUA will remain in effect (meaning this test can be used) for  the duration of the COVID-19 declaration under Section 564(b)(1) of the Act, 21 U.S.C. section 360bbb-3(b)(1), unless the authorization is terminated or revoked.  Performed at [facility], [address], [place], [place] [address]   Culture, blood (Routine x 2)     Status: Abnormal   Collection Time: 04/02/22  2:01 PM   Specimen: BLOOD  Result Value Ref Range Status   Specimen Description BLOOD LEFT ANTECUBITAL  Final   Special Requests   Final    BOTTLES DRAWN AEROBIC AND ANAEROBIC Blood Culture adequate volume   Culture  Setup Time   Final    GRAM POSITIVE COCCI ANAEROBIC BOTTLE ONLY Gram Stain Report Called to,Read Back By and Verified With: WHITE M @ 0838 ON [id number] BY HENDERSON L GRAM POSITIVE COCCI AEROBIC BOTTLE ONLY CRITICAL RESULT CALLED TO, READ BACK BY AND VERIFIED WITH:  C/ M.  WHITE, RN 04/03/22 1400 A. LAFRANCE Performed at [facility], [address] [place], [place] [address]    Culture (A)  Final    STAPHYLOCOCCUS EPIDERMIDIS STAPHYLOCOCCUS HOMINIS    Report Status 04/06/2022 FINAL  Final   Organism ID, Bacteria STAPHYLOCOCCUS EPIDERMIDIS  Final   Organism ID, Bacteria STAPHYLOCOCCUS HOMINIS  Final      Susceptibility   Staphylococcus epidermidis - MIC*    CIPROFLOXACIN <=0.5 SENSITIVE Sensitive     ERYTHROMYCIN >=8 RESISTANT Resistant     GENTAMICIN <=0.5 SENSITIVE Sensitive     OXACILLIN >=4 RESISTANT Resistant     TETRACYCLINE 2 SENSITIVE Sensitive     VANCOMYCIN 1 SENSITIVE Sensitive     TRIMETH/SULFA <=10 SENSITIVE Sensitive     CLINDAMYCIN <=0.25 SENSITIVE Sensitive     RIFAMPIN <=0.5 SENSITIVE Sensitive     Inducible Clindamycin NEGATIVE Sensitive     * STAPHYLOCOCCUS EPIDERMIDIS   Staphylococcus hominis - MIC*    CIPROFLOXACIN <=0.5 SENSITIVE Sensitive     ERYTHROMYCIN >=8 RESISTANT Resistant     GENTAMICIN <=0.5 SENSITIVE Sensitive     OXACILLIN >=4 RESISTANT Resistant     TETRACYCLINE >=16 RESISTANT Resistant     VANCOMYCIN <=0.5 SENSITIVE Sensitive     TRIMETH/SULFA <=10 SENSITIVE Sensitive     CLINDAMYCIN <=0.25 SENSITIVE Sensitive     RIFAMPIN <=0.5 SENSITIVE Sensitive     Inducible Clindamycin NEGATIVE Sensitive     * STAPHYLOCOCCUS HOMINIS  Culture, blood (Routine x 2)     Status: Abnormal   Collection Time: 04/02/22  2:01 PM   Specimen: BLOOD LEFT HAND  Result Value Ref Range Status   Specimen Description   Final    BLOOD LEFT HAND BOTTLES DRAWN AEROBIC ONLY Performed at [facility], [address], [place], [place] [address]    Special Requests   Final    Blood Culture adequate volume Performed at [facility], [address] [place], [place] [address]    Culture  Setup Time   Final    GRAM POSITIVE COCCI AEROBIC BOTTLE ONLY Gram Stain Report Called to,Read Back By and Verified With: WHITE M @ 1334 ON [id number] BY  HENDERSON L CRITICAL VALUE NOTED.  VALUE IS CONSISTENT WITH PREVIOUSLY REPORTED AND CALLED VALUE.    Culture (A)  Final    STAPHYLOCOCCUS EPIDERMIDIS STAPHYLOCOCCUS HOMINIS SUSCEPTIBILITIES PERFORMED ON PREVIOUS CULTURE WITHIN THE LAST 5 DAYS. Performed at [facility], [address], [place], [place] [address]    Report Status 04/06/2022 FINAL  Final  Blood Culture ID Panel (Reflexed)     Status: Abnormal   Collection  Time: 04/02/22  2:01 PM  Result Value Ref Range Status   Enterococcus faecalis NOT DETECTED NOT DETECTED Final   Enterococcus Faecium NOT DETECTED NOT DETECTED Final   Listeria monocytogenes NOT DETECTED NOT DETECTED Final   Staphylococcus species DETECTED (A) NOT DETECTED Final    Comment: CRITICAL RESULT CALLED TO, READ BACK BY AND VERIFIED WITH:  C/ M. WHITE, RN 04/03/22 1400 A. LAFRANCE    Staphylococcus aureus (BCID) NOT DETECTED NOT DETECTED Final   Staphylococcus epidermidis DETECTED (A) NOT DETECTED Final    Comment: Methicillin (oxacillin) resistant coagulase negative staphylococcus. Possible blood culture contaminant (unless isolated from more than one blood culture draw or clinical case suggests pathogenicity). No antibiotic treatment is indicated for blood  culture contaminants. CRITICAL RESULT CALLED TO, READ BACK BY AND VERIFIED WITH:  C/ M. WHITE, RN 04/03/22 1400 A. LAFRANCE    Staphylococcus lugdunensis NOT DETECTED NOT DETECTED Final   Streptococcus species NOT DETECTED NOT DETECTED Final   Streptococcus agalactiae NOT DETECTED NOT DETECTED Final   Streptococcus pneumoniae NOT DETECTED NOT DETECTED Final   Streptococcus pyogenes NOT DETECTED NOT DETECTED Final   A.calcoaceticus-baumannii NOT DETECTED NOT DETECTED Final   Bacteroides fragilis NOT DETECTED NOT DETECTED Final   Enterobacterales NOT DETECTED NOT DETECTED Final   Enterobacter cloacae complex NOT DETECTED NOT DETECTED Final   Escherichia coli NOT DETECTED NOT DETECTED Final    Klebsiella aerogenes NOT DETECTED NOT DETECTED Final   Klebsiella oxytoca NOT DETECTED NOT DETECTED Final   Klebsiella pneumoniae NOT DETECTED NOT DETECTED Final   Proteus species NOT DETECTED NOT DETECTED Final   Salmonella species NOT DETECTED NOT DETECTED Final   Serratia marcescens NOT DETECTED NOT DETECTED Final   Haemophilus influenzae NOT DETECTED NOT DETECTED Final   Neisseria meningitidis NOT DETECTED NOT DETECTED Final   Pseudomonas aeruginosa NOT DETECTED NOT DETECTED Final   Stenotrophomonas maltophilia NOT DETECTED NOT DETECTED Final   Candida albicans NOT DETECTED NOT DETECTED Final   Candida auris NOT DETECTED NOT DETECTED Final   Candida glabrata NOT DETECTED NOT DETECTED Final   Candida krusei NOT DETECTED NOT DETECTED Final   Candida parapsilosis NOT DETECTED NOT DETECTED Final   Candida tropicalis NOT DETECTED NOT DETECTED Final   Cryptococcus neoformans/gattii NOT DETECTED NOT DETECTED Final   Methicillin resistance mecA/C DETECTED (A) NOT DETECTED Final    Comment: CRITICAL RESULT CALLED TO, READ BACK BY AND VERIFIED WITH:  C/ M. WHITE, RN 04/03/22 1400 A. LAFRANCE Performed at [facility], [address] [place], [place] [address]   Urine Culture     Status: None   Collection Time: 04/02/22  3:33 PM   Specimen: Urine, Catheterized  Result Value Ref Range Status   Specimen Description   Final    URINE, CATHETERIZED Performed at [facility], [address], [place], [place] [address]    Special Requests   Final    NONE Performed at [facility], [address], [place], [place] [address]    Culture   Final    NO GROWTH Performed at [facility], [address] [place], [place] [address]    Report Status 04/04/2022 FINAL  Final  Blood culture (routine x 2)     Status: Abnormal (Preliminary result)   Collection Time: 04/03/22  4:23 PM   Specimen: BLOOD  Result Value Ref Range Status   Specimen Description BLOOD LEFT ANTECUBITAL  Final    Special Requests   Final    BOTTLES DRAWN  AEROBIC AND ANAEROBIC Blood Culture adequate volume   Culture  Setup Time   Final    GRAM POSITIVE COCCI Gram Stain Report Called to,Read Back By and Verified With: T. LEONARD  @ 0944 BY STEPHTR  04/04/22 IN BOTH AEROBIC AND ANAEROBIC BOTTLES CRITICAL VALUE NOTED.  VALUE IS CONSISTENT WITH PREVIOUSLY REPORTED AND CALLED VALUE. Performed at [facility], [address], [place], [place] [address]    Culture STAPHYLOCOCCUS EPIDERMIDIS (A)  Final   Report Status PENDING  Incomplete  Blood culture (routine x 2)     Status: Abnormal (Preliminary result)   Collection Time: 04/03/22  4:28 PM   Specimen: BLOOD RIGHT HAND  Result Value Ref Range Status   Specimen Description BLOOD RIGHT HAND  Final   Special Requests   Final    BOTTLES DRAWN AEROBIC AND ANAEROBIC Blood Culture adequate volume   Culture  Setup Time   Final    GRAM POSITIVE COCCI Gram Stain Report Called to,Read Back By and Verified With:  T. LEONARD @ 0944 BY STEPHTR 04/04/22 IN BOTH AEROBIC AND ANAEROBIC BOTTLES Organism ID to follow CRITICAL RESULT CALLED TO, READ BACK BY AND VERIFIED WITH:  Perlie Gold, RN 04/04/22 1552 A. LAFRANCE Performed at [facility], [address] [place], [place] [address]    Culture (A)  Final    STAPHYLOCOCCUS EPIDERMIDIS STAPHYLOCOCCUS HOMINIS    Report Status PENDING  Incomplete   Organism ID, Bacteria STAPHYLOCOCCUS EPIDERMIDIS  Final      Susceptibility   Staphylococcus epidermidis - MIC*    CIPROFLOXACIN <=0.5 SENSITIVE Sensitive     ERYTHROMYCIN >=8 RESISTANT Resistant     GENTAMICIN <=0.5 SENSITIVE Sensitive     OXACILLIN >=4 RESISTANT Resistant     TETRACYCLINE 2 SENSITIVE Sensitive     VANCOMYCIN 2 SENSITIVE Sensitive     TRIMETH/SULFA <=10 SENSITIVE Sensitive     CLINDAMYCIN <=0.25 SENSITIVE Sensitive     RIFAMPIN <=0.5 SENSITIVE Sensitive     Inducible Clindamycin NEGATIVE Sensitive     * STAPHYLOCOCCUS EPIDERMIDIS   Blood Culture ID Panel (Reflexed)     Status: Abnormal   Collection Time: 04/03/22  4:28 PM  Result Value Ref Range Status   Enterococcus faecalis NOT DETECTED NOT DETECTED Final   Enterococcus Faecium NOT DETECTED NOT DETECTED Final   Listeria monocytogenes NOT DETECTED NOT DETECTED Final   Staphylococcus species DETECTED (A) NOT DETECTED Final    Comment: CRITICAL RESULT CALLED TO, READ BACK BY AND VERIFIED WITH:  Perlie Gold, RN 04/04/22 1552 A. LAFRANCE    Staphylococcus aureus (BCID) NOT DETECTED NOT DETECTED Final   Staphylococcus epidermidis DETECTED (A) NOT DETECTED Final    Comment: Methicillin (oxacillin) resistant coagulase negative staphylococcus. Possible blood culture contaminant (unless isolated from more than one blood culture draw or clinical case suggests pathogenicity). No antibiotic treatment is indicated for blood  culture contaminants. CRITICAL RESULT CALLED TO, READ BACK BY AND VERIFIED WITH:  Perlie Gold, RN 04/04/22 1552 A. LAFRANCE    Staphylococcus lugdunensis NOT DETECTED NOT DETECTED Final   Streptococcus species NOT DETECTED NOT DETECTED Final   Streptococcus agalactiae NOT DETECTED NOT DETECTED Final   Streptococcus pneumoniae NOT DETECTED NOT DETECTED Final   Streptococcus pyogenes NOT DETECTED NOT DETECTED Final   A.calcoaceticus-baumannii NOT DETECTED NOT DETECTED Final   Bacteroides fragilis NOT DETECTED NOT DETECTED Final   Enterobacterales NOT DETECTED NOT DETECTED Final   Enterobacter cloacae complex NOT DETECTED NOT DETECTED Final  Escherichia coli NOT DETECTED NOT DETECTED Final   Klebsiella aerogenes NOT DETECTED NOT DETECTED Final   Klebsiella oxytoca NOT DETECTED NOT DETECTED Final   Klebsiella pneumoniae NOT DETECTED NOT DETECTED Final   Proteus species NOT DETECTED NOT DETECTED Final   Salmonella species NOT DETECTED NOT DETECTED Final   Serratia marcescens NOT DETECTED NOT DETECTED Final   Haemophilus influenzae NOT DETECTED NOT  DETECTED Final   Neisseria meningitidis NOT DETECTED NOT DETECTED Final   Pseudomonas aeruginosa NOT DETECTED NOT DETECTED Final   Stenotrophomonas maltophilia NOT DETECTED NOT DETECTED Final   Candida albicans NOT DETECTED NOT DETECTED Final   Candida auris NOT DETECTED NOT DETECTED Final   Candida glabrata NOT DETECTED NOT DETECTED Final   Candida krusei NOT DETECTED NOT DETECTED Final   Candida parapsilosis NOT DETECTED NOT DETECTED Final   Candida tropicalis NOT DETECTED NOT DETECTED Final   Cryptococcus neoformans/gattii NOT DETECTED NOT DETECTED Final   Methicillin resistance mecA/C DETECTED (A) NOT DETECTED Final    Comment: CRITICAL RESULT CALLED TO, READ BACK BY AND VERIFIED WITH:  Perlie Gold, RN 04/04/22 1552 A. LAFRANCE Performed at [facility], [address], [place], [place] [address]   Culture, blood (Routine X 2) w Reflex to ID Panel     Status: None (Preliminary result)   Collection Time: 04/04/22 10:20 AM   Specimen: BLOOD RIGHT ARM  Result Value Ref Range Status   Specimen Description   Final    BLOOD RIGHT ARM BOTTLES DRAWN AEROBIC AND ANAEROBIC   Special Requests   Final    Blood Culture results may not be optimal due to an excessive volume of blood received in culture bottles   Culture   Final    NO GROWTH 2 DAYS Performed at [facility], [address], [place], [place] [address]    Report Status PENDING  Incomplete  Culture, blood (Routine X 2) w Reflex to ID Panel     Status: None (Preliminary result)   Collection Time: 04/04/22 10:20 AM   Specimen: BLOOD RIGHT HAND  Result Value Ref Range Status   Specimen Description   Final    BLOOD RIGHT HAND BOTTLES DRAWN AEROBIC AND ANAEROBIC   Special Requests   Final    Blood Culture results may not be optimal due to an excessive volume of blood received in culture bottles   Culture   Final    NO GROWTH 2 DAYS Performed at [facility], [address], [place], [place] [address]    Report Status  PENDING  Incomplete  Gastrointestinal Panel by PCR , Stool     Status: None   Collection Time: 04/04/22  2:24 PM  Result Value Ref Range Status   Campylobacter species NOT DETECTED NOT DETECTED Final   Plesimonas shigelloides NOT DETECTED NOT DETECTED Final   Salmonella species NOT DETECTED NOT DETECTED Final   Yersinia enterocolitica NOT DETECTED NOT DETECTED Final   Vibrio species NOT DETECTED NOT DETECTED Final   Vibrio cholerae NOT DETECTED NOT DETECTED Final   Enteroaggregative E coli (EAEC) NOT DETECTED NOT DETECTED Final   Enteropathogenic E coli (EPEC) NOT DETECTED NOT DETECTED Final   Enterotoxigenic E coli (ETEC) NOT DETECTED NOT DETECTED Final   Shiga like toxin producing E coli (STEC) NOT DETECTED NOT DETECTED Final   Shigella/Enteroinvasive E coli (EIEC) NOT DETECTED NOT DETECTED Final   Cryptosporidium NOT DETECTED NOT DETECTED Final   Cyclospora cayetanensis NOT DETECTED NOT DETECTED Final   Entamoeba  histolytica NOT DETECTED NOT DETECTED Final   Giardia lamblia NOT DETECTED NOT DETECTED Final   Adenovirus F40/41 NOT DETECTED NOT DETECTED Final   Astrovirus NOT DETECTED NOT DETECTED Final   Norovirus GI/GII NOT DETECTED NOT DETECTED Final   Rotavirus A NOT DETECTED NOT DETECTED Final   Sapovirus (I, II, IV, and V) NOT DETECTED NOT DETECTED Final    Comment: Performed at [facility], [address] [place], [place] [address]  Culture, blood (single) w Reflex to ID Panel     Status: None (Preliminary result)   Collection Time: 04/05/22  4:30 PM   Specimen: Right Antecubital; Blood  Result Value Ref Range Status   Specimen Description   Final    RIGHT ANTECUBITAL Performed at [facility], [address] [place], [place] [address]    Special Requests   Final    BOTTLES DRAWN AEROBIC AND ANAEROBIC Blood Culture adequate volume Performed at [facility], [address], [place], [place] [address]    Culture  Setup Time   Final    GRAM POSITIVE COCCI Gram  Stain Report Called to,Read Back By and Verified With: [email or web address]  BY MATTHEWS, B 1.16.2024 IN BOTH AEROBIC AND ANAEROBIC BOTTLES CRITICAL VALUE NOTED.  VALUE IS CONSISTENT WITH PREVIOUSLY REPORTED AND CALLED VALUE. Performed at [facility], [place] [address] [place], [place] [address]    Culture GRAM POSITIVE COCCI  Final   Report Status PENDING  Incomplete     Scheduled Meds:  acidophilus  1 capsule Oral Daily   atorvastatin  20 mg Oral Daily   Chlorhexidine Gluconate Cloth  6 each Topical Daily   cholestyramine light  4 g Oral Daily   dicyclomine  10 mg Oral TID AC & HS   diphenoxylate-atropine  1 tablet Oral QID   escitalopram  20 mg Oral Daily   gabapentin  100 mg Oral QHS   insulin aspart  0-9 Units Subcutaneous Q8H   lipase/protease/amylase  108,000 Units Oral TID WC   megestrol  400 mg Oral BID   mirtazapine  30 mg Oral QHS   pantoprazole  40 mg Oral BID AC   Continuous Infusions:  TPN ADULT (ION) 60 mL/hr at 04/06/22 0451   TPN ADULT (ION)     vancomycin Stopped (04/05/22 2258)    Procedures/Studies: ECHOCARDIOGRAM COMPLETE  Result Date: 04/04/2022    ECHOCARDIOGRAM REPORT   Patient Name:   ROXIE GUEYE Date of Exam: 04/04/2022 Medical Rec #:  [id number]        Height:       68.0 in Accession #:    [id number]       Weight:       120.0 lb Date of Birth:  08-16-1950       BSA:          1.645 m Patient Age:    26 years         BP:           95/46 mmHg Patient Gender: M                HR:           91 bpm. Exam Location:  Inpatient Procedure: 2D Echo, Cardiac Doppler and Color Doppler Indications:    Bacteremia  History:        Patient has prior history of Echocardiogram examinations, most                 recent 03/20/2016. Signs/Symptoms:Fever; Risk  Factors:Hypertension. Cancer. Pulmonary embolus 2012.  Sonographer:    Sheralyn Boatman RDCS Referring Phys: [phone] Elka Satterfield IMPRESSIONS  1. Left ventricular ejection fraction, by estimation, is 60 to 65%. Left  ventricular ejection fraction by 2D MOD biplane is 60.6 %. The left ventricle has normal function. The left ventricle has no regional wall motion abnormalities. Left ventricular diastolic parameters are consistent with Grade I diastolic dysfunction (impaired relaxation).  2. Right ventricular systolic function is normal. The right ventricular size is normal. There is mildly elevated pulmonary artery systolic pressure. The estimated right ventricular systolic pressure is 43.2 mmHg.  3. The mitral valve is grossly normal. Trivial mitral valve regurgitation.  4. The aortic valve is tricuspid. Aortic valve regurgitation is not visualized.  5. The inferior vena cava is normal in size with greater than 50% respiratory variability, suggesting right atrial pressure of 3 mmHg. Conclusion(s)/Recommendation(s): No evidence of valvular vegetations on this transthoracic echocardiogram. Consider a transesophageal echocardiogram to exclude infective endocarditis if clinically indicated. FINDINGS  Left Ventricle: Left ventricular ejection fraction, by estimation, is 60 to 65%. Left ventricular ejection fraction by 2D MOD biplane is 60.6 %. The left ventricle has normal function. The left ventricle has no regional wall motion abnormalities. The left ventricular internal cavity size was normal in size. There is no left ventricular hypertrophy. Left ventricular diastolic parameters are consistent with Grade I diastolic dysfunction (impaired relaxation). Indeterminate filling pressures. Right Ventricle: The right ventricular size is normal. No increase in right ventricular wall thickness. Right ventricular systolic function is normal. There is mildly elevated pulmonary artery systolic pressure. The tricuspid regurgitant velocity is 3.17  m/s, and with an assumed right atrial pressure of 3 mmHg, the estimated right ventricular systolic pressure is 43.2 mmHg. Left Atrium: Left atrial size was normal in size. Right Atrium: Right atrial  size was normal in size. Pericardium: There is no evidence of pericardial effusion. Mitral Valve: The mitral valve is grossly normal. Trivial mitral valve regurgitation. Tricuspid Valve: The tricuspid valve is grossly normal. Tricuspid valve regurgitation is mild. Aortic Valve: The aortic valve is tricuspid. Aortic valve regurgitation is not visualized. Pulmonic Valve: The pulmonic valve was normal in structure. Pulmonic valve regurgitation is not visualized. Aorta: The aortic root and ascending aorta are structurally normal, with no evidence of dilitation. Venous: The inferior vena cava is normal in size with greater than 50% respiratory variability, suggesting right atrial pressure of 3 mmHg. IAS/Shunts: No atrial level shunt detected by color flow Doppler.  LEFT VENTRICLE PLAX 2D                        Biplane EF (MOD) LVIDd:         3.80 cm         LV Biplane EF:   Left LVIDs:         2.40 cm                          ventricular LV PW:         1.10 cm                          ejection LV IVS:        0.80 cm                          fraction by LVOT diam:  2.30 cm                          2D MOD LV SV:         90                               biplane is LV SV Index:   55                               60.6 %. LVOT Area:     4.15 cm                                Diastology                                LV e' medial:    8.70 cm/s LV Volumes (MOD)               LV E/e' medial:  13.4 LV vol d, MOD    96.3 ml       LV e' lateral:   11.90 cm/s A2C:                           LV E/e' lateral: 9.8 LV vol d, MOD    90.4 ml A4C: LV vol s, MOD    41.7 ml A2C: LV vol s, MOD    38.0 ml A4C: LV SV MOD A2C:   54.6 ml LV SV MOD A4C:   90.4 ml LV SV MOD BP:    62.1 ml RIGHT VENTRICLE             IVC RV S prime:     16.00 cm/s  IVC diam: 1.80 cm TAPSE (M-mode): 1.8 cm LEFT ATRIUM             Index        RIGHT ATRIUM           Index LA diam:        3.60 cm 2.19 cm/m   RA Area:     13.50 cm LA Vol (A2C):   69.4 ml 42.19 ml/m   RA Volume:   36.00 ml  21.89 ml/m LA Vol (A4C):   27.6 ml 16.78 ml/m LA Biplane Vol: 46.0 ml 27.97 ml/m  AORTIC VALVE LVOT Vmax:   135.00 cm/s LVOT Vmean:  90.700 cm/s LVOT VTI:    0.216 m  AORTA Ao Root diam: 3.10 cm Ao Asc diam:  3.60 cm MITRAL VALVE                TRICUSPID VALVE MV Area (PHT): 4.15 cm     TR Peak grad:   40.2 mmHg MV Decel Time: 183 msec     TR Vmax:        317.00 cm/s MV E velocity: 116.67 cm/s MV A velocity: 96.57 cm/s   SHUNTS MV E/A ratio:  1.21         Systemic VTI:  0.22 m                             Systemic Diam: 2.30 cm [employer]  MD Electronically signed by Zoila Shutter MD Signature Date/Time: 04/04/2022/5:27:58 PM    Final    DG Chest Port 1 View  Result Date: 04/02/2022 CLINICAL DATA:  Possible sepsis EXAM: PORTABLE CHEST 1 VIEW COMPARISON:  01/08/2022 FINDINGS: Cardiac size is within normal limits. There is decreased volume in the left lung. Linear densities are seen in left mid and left lower lung fields. Surgical staples are seen in left parahilar region. Left hemidiaphragm is elevated. There is blunting of left lateral CP angle. There is no pneumothorax. Tip of left subclavian chest port is seen in superior vena cava. IMPRESSION: Postsurgical changes are noted in left hemithorax. There are no new infiltrates or signs of pulmonary edema. Electronically Signed   By: Ernie Avena M.D.   On: 04/02/2022 13:21    Catarina Hartshorn, DO  [facility]  If 7PM-7AM, please contact night-coverage [email or web address] Password [email or web address] 04/06/2022, 5:13 PM   LOS: 3 days

## 2022-04-06 NOTE — Progress Notes (Signed)
Gastroenterology Progress Note   Referring Provider: No ref. provider found Primary Care Physician:  Romeo Rabon, MD Primary Gastroenterologist:  Roetta Sessions, MD   Patient ID: Selena Batten; [id number]; 1950-04-14   Subjective:    No complaints. Rested ok. Febrile during the night. Several dark watery stools in the past 24 hours. Number and consistency at baseline but color darker than usual.  Objective:   Vital signs in last 24 hours: Temp:  [99.5 F (37.5 C)-102.8 F (39.3 C)] 99.5 F (37.5 C) (01/16 0520) Pulse Rate:  [92-106] 93 (01/16 0520) Resp:  [17-27] 17 (01/16 0520) BP: (109-142)/(54-81) 110/58 (01/16 0520) SpO2:  [95 %-100 %] 97 % (01/16 0520) Last BM Date : 04/04/22 General:   Alert,  Well-developed, well-nourished, pleasant and cooperative in NAD Head:  Normocephalic and atraumatic. Eyes:  Sclera clear, no icterus.   Abdomen:  Soft, nontender and nondistended. Normal bowel sounds, without guarding, and without rebound.   Extremities:  Without clubbing, deformity or edema. Neurologic:  Alert and  oriented x4;  grossly normal neurologically. Psych:  Alert and cooperative. Normal mood and affect.  Intake/Output from previous day: 01/15 0701 - 01/16 0700 In: 2041.1 [P.O.:720; I.V.:639.6; Blood:380; IV Piggyback:301.5] Out: 775 [Urine:775] Intake/Output this shift: No intake/output data recorded.  Lab Results: CBC Recent Labs    04/04/22 0456 04/05/22 0524 04/06/22 0524  WBC 4.4 5.5 7.9  HGB 7.7* 7.4* 8.2*  HCT 23.3* 22.6* 24.7*  MCV 92.8 93.8 93.2  PLT 69* 71* 96*   BMET Recent Labs    04/04/22 0456 04/05/22 0524 04/06/22 0524  NA 135 136 134*  K 3.0* 4.2 4.3  CL 107 109 106  CO2 20* 20* 23  GLUCOSE 135* 123* 118*  BUN 14 11 11   CREATININE 1.01 0.98 1.03  CALCIUM 7.1* 7.1* 7.2*   LFTs Recent Labs    04/04/22 0456 04/05/22 0524  BILITOT 0.2* 0.3  ALKPHOS 73 69  AST 27 19  ALT 24 20  PROT 4.5* 4.2*  ALBUMIN 1.6* 1.5*    No results for input(s): "LIPASE" in the last 72 hours. PT/INR No results for input(s): "LABPROT", "INR" in the last 72 hours.       Imaging Studies: ECHOCARDIOGRAM COMPLETE  Result Date: 04/04/2022    ECHOCARDIOGRAM REPORT   Patient Name:   DAHIR AYER Date of Exam: 04/04/2022 Medical Rec #:  04/06/2022        Height:       68.0 in Accession #:    [id number]       Weight:       120.0 lb Date of Birth:  07/18/1950       BSA:          1.645 m Patient Age:    72 years         BP:           95/46 mmHg Patient Gender: M                HR:           91 bpm. Exam Location:  Inpatient Procedure: 2D Echo, Cardiac Doppler and Color Doppler Indications:    Bacteremia  History:        Patient has prior history of Echocardiogram examinations, most                 recent 03/20/2016. Signs/Symptoms:Fever; Risk  Factors:Hypertension. Cancer. Pulmonary embolus 2012.  Sonographer:    Sheralyn Boatman RDCS Referring Phys: [phone] DAVID TAT IMPRESSIONS  1. Left ventricular ejection fraction, by estimation, is 60 to 65%. Left ventricular ejection fraction by 2D MOD biplane is 60.6 %. The left ventricle has normal function. The left ventricle has no regional wall motion abnormalities. Left ventricular diastolic parameters are consistent with Grade I diastolic dysfunction (impaired relaxation).  2. Right ventricular systolic function is normal. The right ventricular size is normal. There is mildly elevated pulmonary artery systolic pressure. The estimated right ventricular systolic pressure is 43.2 mmHg.  3. The mitral valve is grossly normal. Trivial mitral valve regurgitation.  4. The aortic valve is tricuspid. Aortic valve regurgitation is not visualized.  5. The inferior vena cava is normal in size with greater than 50% respiratory variability, suggesting right atrial pressure of 3 mmHg. Conclusion(s)/Recommendation(s): No evidence of valvular vegetations on this transthoracic echocardiogram. Consider a  transesophageal echocardiogram to exclude infective endocarditis if clinically indicated. FINDINGS  Left Ventricle: Left ventricular ejection fraction, by estimation, is 60 to 65%. Left ventricular ejection fraction by 2D MOD biplane is 60.6 %. The left ventricle has normal function. The left ventricle has no regional wall motion abnormalities. The left ventricular internal cavity size was normal in size. There is no left ventricular hypertrophy. Left ventricular diastolic parameters are consistent with Grade I diastolic dysfunction (impaired relaxation). Indeterminate filling pressures. Right Ventricle: The right ventricular size is normal. No increase in right ventricular wall thickness. Right ventricular systolic function is normal. There is mildly elevated pulmonary artery systolic pressure. The tricuspid regurgitant velocity is 3.17  m/s, and with an assumed right atrial pressure of 3 mmHg, the estimated right ventricular systolic pressure is 43.2 mmHg. Left Atrium: Left atrial size was normal in size. Right Atrium: Right atrial size was normal in size. Pericardium: There is no evidence of pericardial effusion. Mitral Valve: The mitral valve is grossly normal. Trivial mitral valve regurgitation. Tricuspid Valve: The tricuspid valve is grossly normal. Tricuspid valve regurgitation is mild. Aortic Valve: The aortic valve is tricuspid. Aortic valve regurgitation is not visualized. Pulmonic Valve: The pulmonic valve was normal in structure. Pulmonic valve regurgitation is not visualized. Aorta: The aortic root and ascending aorta are structurally normal, with no evidence of dilitation. Venous: The inferior vena cava is normal in size with greater than 50% respiratory variability, suggesting right atrial pressure of 3 mmHg. IAS/Shunts: No atrial level shunt detected by color flow Doppler.  LEFT VENTRICLE PLAX 2D                        Biplane EF (MOD) LVIDd:         3.80 cm         LV Biplane EF:   Left LVIDs:          2.40 cm                          ventricular LV PW:         1.10 cm                          ejection LV IVS:        0.80 cm                          fraction by LVOT diam:  2.30 cm                          2D MOD LV SV:         90                               biplane is LV SV Index:   55                               60.6 %. LVOT Area:     4.15 cm                                Diastology                                LV e' medial:    8.70 cm/s LV Volumes (MOD)               LV E/e' medial:  13.4 LV vol d, MOD    96.3 ml       LV e' lateral:   11.90 cm/s A2C:                           LV E/e' lateral: 9.8 LV vol d, MOD    90.4 ml A4C: LV vol s, MOD    41.7 ml A2C: LV vol s, MOD    38.0 ml A4C: LV SV MOD A2C:   54.6 ml LV SV MOD A4C:   90.4 ml LV SV MOD BP:    62.1 ml RIGHT VENTRICLE             IVC RV S prime:     16.00 cm/s  IVC diam: 1.80 cm TAPSE (M-mode): 1.8 cm LEFT ATRIUM             Index        RIGHT ATRIUM           Index LA diam:        3.60 cm 2.19 cm/m   RA Area:     13.50 cm LA Vol (A2C):   69.4 ml 42.19 ml/m  RA Volume:   36.00 ml  21.89 ml/m LA Vol (A4C):   27.6 ml 16.78 ml/m LA Biplane Vol: 46.0 ml 27.97 ml/m  AORTIC VALVE LVOT Vmax:   135.00 cm/s LVOT Vmean:  90.700 cm/s LVOT VTI:    0.216 m  AORTA Ao Root diam: 3.10 cm Ao Asc diam:  3.60 cm MITRAL VALVE                TRICUSPID VALVE MV Area (PHT): 4.15 cm     TR Peak grad:   40.2 mmHg MV Decel Time: 183 msec     TR Vmax:        317.00 cm/s MV E velocity: 116.67 cm/s MV A velocity: 96.57 cm/s   SHUNTS MV E/A ratio:  1.21         Systemic VTI:  0.22 m                             Systemic Diam: 2.30 cm Zoila Shutter MD  Electronically signed by Zoila Shutter MD Signature Date/Time: 04/04/2022/5:27:58 PM    Final    DG Chest Port 1 View  Result Date: 04/02/2022 CLINICAL DATA:  Possible sepsis EXAM: PORTABLE CHEST 1 VIEW COMPARISON:  01/08/2022 FINDINGS: Cardiac size is within normal limits. There is decreased volume in the left lung.  Linear densities are seen in left mid and left lower lung fields. Surgical staples are seen in left parahilar region. Left hemidiaphragm is elevated. There is blunting of left lateral CP angle. There is no pneumothorax. Tip of left subclavian chest port is seen in superior vena cava. IMPRESSION: Postsurgical changes are noted in left hemithorax. There are no new infiltrates or signs of pulmonary edema. Electronically Signed   By: Ernie Avena M.D.   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 H/o pancreatic exocrine insufficiency. 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 GI consulted for anemia, hemoccult positive stools.    Anemia/GI bleeding: stool hemoccult positive but recently reports intermittent melena. Hospitalized in 12/2021 for symptomatic anemia/occult GI bleeding, completed EGD/flex sig (completed to neoterminal ileum but poor prep). His last chemo 12/2021. He has been on TPN for two weeks trying to prepare for surgical resection of pancreatic cancer. Chronically on Eliquis, last dose morning of 04/04/22. Mild decline in Hgb from baseline.    Hgb up to 8.2 after one unit of prbcs. Reports ongoing watery dark/black stools. Febrile overnight in setting of bacteremia. Discussed with Dr. Levon Hedger, plan for EGD/enteroscopy tomorrow.   Plan:   Clear liquids. NPO after midnight.  EGD/enteroscopy tomorrow.  I have discussed the  risks, alternatives, benefits with regards to but not limited to the risk of reaction to medication, bleeding, infection, perforation and the patient is agreeable to proceed. Written consent to be obtained. Continue PPI BID. Continue to follow H/H.   LOS: 3 days   Leanna Battles. [facility] [phone] 1/16/20247:54 AM

## 2022-04-06 NOTE — Progress Notes (Signed)
Patient rested quietly throughout the night, temperature reported of 102.8 orally patient non-symptomatic fever relieved by PRN tylenol. Physician received report of gram positive cocci from anaerobic blood culture specimen collected, no new orders received.

## 2022-04-06 NOTE — H&P (View-Only) (Signed)
Gastroenterology Progress Note   Referring Provider: No ref. provider found Primary Care Physician:  Romeo Rabon, MD Primary Gastroenterologist:  Roetta Sessions, MD   [name]; [id number]; 1950/05/19   Subjective:    No complaints. Rested ok. Febrile during the night. Several dark watery stools in the past 24 hours. Number and consistency at baseline but color darker than usual.  Objective:   Vital signs in last 24 hours: Temp:  [99.5 F (37.5 C)-102.8 F (39.3 C)] 99.5 F (37.5 C) (01/16 0520) Pulse Rate:  [92-106] 93 (01/16 0520) Resp:  [17-27] 17 (01/16 0520) BP: (109-142)/(54-81) 110/58 (01/16 0520) SpO2:  [95 %-100 %] 97 % (01/16 0520) Last BM Date : 04/04/22 General:   Alert,  Well-developed, well-nourished, pleasant and cooperative in NAD Head:  Normocephalic and atraumatic. Eyes:  Sclera clear, no icterus.   Abdomen:  Soft, nontender and nondistended. Normal bowel sounds, without guarding, and without rebound.   Extremities:  Without clubbing, deformity or edema. Neurologic:  Alert and  oriented x4;  grossly normal neurologically. Psych:  Alert and cooperative. Normal mood and affect.  Intake/Output from previous day: 01/15 0701 - 01/16 0700 In: 2041.1 [P.O.:720; I.V.:639.6; Blood:380; IV Piggyback:301.5] Out: 775 [Urine:775] Intake/Output this shift: No intake/output data recorded.  Lab Results: CBC Recent Labs    04/04/22 0456 04/05/22 0524 04/06/22 0524  WBC 4.4 5.5 7.9  HGB 7.7* 7.4* 8.2*  HCT 23.3* 22.6* 24.7*  MCV 92.8 93.8 93.2  PLT 69* 71* 96*   BMET Recent Labs    04/04/22 0456 04/05/22 0524 04/06/22 0524  NA 135 136 134*  K 3.0* 4.2 4.3  CL 107 109 106  CO2 20* 20* 23  GLUCOSE 135* 123* 118*  BUN 14 11 11   CREATININE 1.01 0.98 1.03  CALCIUM 7.1* 7.1* 7.2*   LFTs Recent Labs    04/04/22 0456 04/05/22 0524  BILITOT 0.2* 0.3  ALKPHOS 73 69  AST 27 19  ALT 24 20  PROT 4.5* 4.2*  ALBUMIN 1.6* 1.5*    No results for input(s): "LIPASE" in the last 72 hours. PT/INR No results for input(s): "LABPROT", "INR" in the last 72 hours.       Imaging Studies: ECHOCARDIOGRAM COMPLETE  Result Date: 04/04/2022    ECHOCARDIOGRAM REPORT   Patient Name:   [name] Date of Exam: 04/04/2022 Medical Rec #:  04/06/2022        Height:       68.0 in Accession #:    [id number]       Weight:       120.0 lb Date of Birth:  11-Feb-1951       BSA:          1.645 m Patient Age:    71 years         BP:           95/46 mmHg Patient Gender: M                HR:           91 bpm. Exam Location:  Inpatient Procedure: 2D Echo, Cardiac Doppler and Color Doppler Indications:    Bacteremia  History:        Patient has prior history of Echocardiogram examinations, most                 recent 03/20/2016. Signs/Symptoms:Fever; Risk  Factors:Hypertension. Cancer. Pulmonary embolus 2012.  Sonographer:    Sheralyn Boatman RDCS Referring Phys: [phone] DAVID TAT IMPRESSIONS  1. Left ventricular ejection fraction, by estimation, is 60 to 65%. Left ventricular ejection fraction by 2D MOD biplane is 60.6 %. The left ventricle has normal function. The left ventricle has no regional wall motion abnormalities. Left ventricular diastolic parameters are consistent with Grade I diastolic dysfunction (impaired relaxation).  2. Right ventricular systolic function is normal. The right ventricular size is normal. There is mildly elevated pulmonary artery systolic pressure. The estimated right ventricular systolic pressure is 43.2 mmHg.  3. The mitral valve is grossly normal. Trivial mitral valve regurgitation.  4. The aortic valve is tricuspid. Aortic valve regurgitation is not visualized.  5. The inferior vena cava is normal in size with greater than 50% respiratory variability, suggesting right atrial pressure of 3 mmHg. Conclusion(s)/Recommendation(s): No evidence of valvular vegetations on this transthoracic echocardiogram. Consider a  transesophageal echocardiogram to exclude infective endocarditis if clinically indicated. FINDINGS  Left Ventricle: Left ventricular ejection fraction, by estimation, is 60 to 65%. Left ventricular ejection fraction by 2D MOD biplane is 60.6 %. The left ventricle has normal function. The left ventricle has no regional wall motion abnormalities. The left ventricular internal cavity size was normal in size. There is no left ventricular hypertrophy. Left ventricular diastolic parameters are consistent with Grade I diastolic dysfunction (impaired relaxation). Indeterminate filling pressures. Right Ventricle: The right ventricular size is normal. No increase in right ventricular wall thickness. Right ventricular systolic function is normal. There is mildly elevated pulmonary artery systolic pressure. The tricuspid regurgitant velocity is 3.17  m/s, and with an assumed right atrial pressure of 3 mmHg, the estimated right ventricular systolic pressure is 43.2 mmHg. Left Atrium: Left atrial size was normal in size. Right Atrium: Right atrial size was normal in size. Pericardium: There is no evidence of pericardial effusion. Mitral Valve: The mitral valve is grossly normal. Trivial mitral valve regurgitation. Tricuspid Valve: The tricuspid valve is grossly normal. Tricuspid valve regurgitation is mild. Aortic Valve: The aortic valve is tricuspid. Aortic valve regurgitation is not visualized. Pulmonic Valve: The pulmonic valve was normal in structure. Pulmonic valve regurgitation is not visualized. Aorta: The aortic root and ascending aorta are structurally normal, with no evidence of dilitation. Venous: The inferior vena cava is normal in size with greater than 50% respiratory variability, suggesting right atrial pressure of 3 mmHg. IAS/Shunts: No atrial level shunt detected by color flow Doppler.  LEFT VENTRICLE PLAX 2D                        Biplane EF (MOD) LVIDd:         3.80 cm         LV Biplane EF:   Left LVIDs:          2.40 cm                          ventricular LV PW:         1.10 cm                          ejection LV IVS:        0.80 cm                          fraction by LVOT diam:  2.30 cm                          2D MOD LV SV:         90                               biplane is LV SV Index:   55                               60.6 %. LVOT Area:     4.15 cm                                Diastology                                LV e' medial:    8.70 cm/s LV Volumes (MOD)               LV E/e' medial:  13.4 LV vol d, MOD    96.3 ml       LV e' lateral:   11.90 cm/s A2C:                           LV E/e' lateral: 9.8 LV vol d, MOD    90.4 ml A4C: LV vol s, MOD    41.7 ml A2C: LV vol s, MOD    38.0 ml A4C: LV SV MOD A2C:   54.6 ml LV SV MOD A4C:   90.4 ml LV SV MOD BP:    62.1 ml RIGHT VENTRICLE             IVC RV S prime:     16.00 cm/s  IVC diam: 1.80 cm TAPSE (M-mode): 1.8 cm LEFT ATRIUM             Index        RIGHT ATRIUM           Index LA diam:        3.60 cm 2.19 cm/m   RA Area:     13.50 cm LA Vol (A2C):   69.4 ml 42.19 ml/m  RA Volume:   36.00 ml  21.89 ml/m LA Vol (A4C):   27.6 ml 16.78 ml/m LA Biplane Vol: 46.0 ml 27.97 ml/m  AORTIC VALVE LVOT Vmax:   135.00 cm/s LVOT Vmean:  90.700 cm/s LVOT VTI:    0.216 m  AORTA Ao Root diam: 3.10 cm Ao Asc diam:  3.60 cm MITRAL VALVE                TRICUSPID VALVE MV Area (PHT): 4.15 cm     TR Peak grad:   40.2 mmHg MV Decel Time: 183 msec     TR Vmax:        317.00 cm/s MV E velocity: 116.67 cm/s MV A velocity: 96.57 cm/s   SHUNTS MV E/A ratio:  1.21         Systemic VTI:  0.22 m                             Systemic Diam: 2.30 cm Zoila Shutter MD  Electronically signed by Zoila Shutter MD Signature Date/Time: 04/04/2022/5:27:58 PM    Final    DG Chest Port 1 View  Result Date: 04/02/2022 CLINICAL DATA:  Possible sepsis EXAM: PORTABLE CHEST 1 VIEW COMPARISON:  01/08/2022 FINDINGS: Cardiac size is within normal limits. There is decreased volume in the left lung.  Linear densities are seen in left mid and left lower lung fields. Surgical staples are seen in left parahilar region. Left hemidiaphragm is elevated. There is blunting of left lateral CP angle. There is no pneumothorax. Tip of left subclavian chest port is seen in superior vena cava. IMPRESSION: Postsurgical changes are noted in left hemithorax. There are no new infiltrates or signs of pulmonary edema. Electronically Signed   By: Ernie Avena M.D.   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 H/o pancreatic exocrine insufficiency. 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 GI consulted for anemia, hemoccult positive stools.    Anemia/GI bleeding: stool hemoccult positive but recently reports intermittent melena. Hospitalized in 12/2021 for symptomatic anemia/occult GI bleeding, completed EGD/flex sig (completed to neoterminal ileum but poor prep). His last chemo 12/2021. He has been on TPN for two weeks trying to prepare for surgical resection of pancreatic cancer. Chronically on Eliquis, last dose morning of 04/04/22. Mild decline in Hgb from baseline.    Hgb up to 8.2 after one unit of prbcs. Reports ongoing watery dark/black stools. Febrile overnight in setting of bacteremia. Discussed with Dr. Levon Hedger, plan for EGD/enteroscopy tomorrow.   Plan:   Clear liquids. NPO after midnight.  EGD/enteroscopy tomorrow.  I have discussed the  risks, alternatives, benefits with regards to but not limited to the risk of reaction to medication, bleeding, infection, perforation and the patient is agreeable to proceed. Written consent to be obtained. Continue PPI BID. Continue to follow H/H.   LOS: 3 days   Leanna Battles. Dixon Boos [facility] [phone] 1/16/20247:54 AM

## 2022-04-06 NOTE — Consult Note (Signed)
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 1/13 1/2 staph epi and stpah hominis 1/2 staph epi), repeat cx form port on 1/15+ G[facility]. -Sens for staph epi 1/12 and 1/13 match -TTE  showed no veg   #Chest port x 11 years -Used October, 2023 for chemo, plans to restart chemo (date unclear) -On TPN x 2 weeks for prep for surgical resection of pancreatic ca.  -I spoke with patient in regards to removing port as it is likely the source of his bacteremia. Espicially given he is getting TPN through it.   He is amenable to port removal at this point.  Recommendations:  -Continue vancomycin -Repeat blood Cx to ensure clearance -Remove port -TEE given persistent + blood Cx with long standing port -if TEE negative then 2 week of abx from port removal  #GIB -GI following, -EGD tomorrow Microbiology:   Antibiotics: Vancomycin 1/14- Azithromycin 1/12 Ceftriaxone 1/12  Cultures: Blood 1/12 2/2 staph epi 1/13 1/2 staph epi and stpah hominis 1/2 staph epi 1/15 port GPC   HPI: [name] is a 72 y.o. male with past medical of stage III adenocarcinoma, GERD, hypertension, posterior gastrectomy, pancreatic cancer,  PE on AC, chronic anemia admitted with fevers and chills found to have MRSA bacteremia.  Infectious disease engaged.      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   Stage 3   Anemia    Anxiety    Arthritis    Barrett's esophagus    Blood transfusion    Bowel obstruction (HCC) 05/13/2012   Recurrent   Bronchitis    Chest pain at rest    Chronic abdominal pain    Erosive esophagitis    ETOH abuse    quit 03/2010   GERD (gastroesophageal reflux disease)    Hx of Clostridium difficile infection 01/2012   Hypertension    Ileus (HCC)    Iron deficiency anemia 03/23/2016   Lung cancer (HCC)    Obstruction of bowel (HCC) 03/03/2014   Osteoporosis    Pancreatic cancer (HCC)    Personal history of PE (pulmonary embolism) 10/01/2010   Pneumonia    Pulmonary embolism (HCC) 02/2010   Recurrent upper respiratory infection (URI)    Renal disorder    S/P endoscopy 09/28/2010   erosive reflux esophagitis, [place] I anatomy   S/P partial gastrectomy 1980s   Seizures (HCC)    was on meds for 6 months, Unknown etiology, last seizure was 2017.   Shortness of breath    TIA (transient ischemic attack) 12/2009   Vitamin B12 deficiency     Social History  Tobacco Use   Smoking status: Former    Packs/day: 0.50    Years: 40.00    Total pack years: 20.00    Types: Cigarettes    Quit date: 12/20/2012    Years since quitting: 9.2   Smokeless tobacco: Never  Vaping Use   Vaping Use: Never used  Substance Use Topics   Alcohol use: No   Drug use: No    Family History  Problem Relation Age of Onset   Hypertension Mother    Arthritis Mother    Pneumonia Mother    Hypertension Father    Heart attack Father    Bone cancer Cousin    Colon cancer Neg Hx    Scheduled Meds:  acidophilus  1 capsule Oral Daily   atorvastatin  20 mg Oral Daily   Chlorhexidine Gluconate Cloth  6 each Topical Daily   cholestyramine light  4 g  Oral Daily   dicyclomine  10 mg Oral TID AC & HS   diphenoxylate-atropine  1 tablet Oral QID   escitalopram  20 mg Oral Daily   gabapentin  100 mg Oral QHS   insulin aspart  0-9 Units Subcutaneous Q8H   lipase/protease/amylase  108,000 Units Oral TID WC   megestrol  400 mg Oral BID   mirtazapine  30 mg Oral QHS   pantoprazole  40 mg Oral BID AC   Continuous Infusions:  TPN ADULT (ION) 60 mL/hr at 04/06/22 0451   TPN ADULT (ION)     vancomycin Stopped (04/05/22 2258)   PRN Meds:.acetaminophen, HYDROcodone-acetaminophen, lipase/protease/amylase, ondansetron **OR** ondansetron (ZOFRAN) IV, polyethylene glycol, prochlorperazine No Known Allergies   Lab Results Lab Results  Component Value Date   WBC 7.9 04/06/2022   HGB 8.2 (L) 04/06/2022   HCT 24.7 (L) 04/06/2022   MCV 93.2 04/06/2022   PLT 96 (L) 04/06/2022    Lab Results  Component Value Date   CREATININE 1.03 04/06/2022   BUN 11 04/06/2022   NA 134 (L) 04/06/2022   K 4.3 04/06/2022   CL 106 04/06/2022   CO2 23 04/06/2022    Lab Results  Component Value Date   ALT 20 04/05/2022   AST 19 04/05/2022   ALKPHOS 69 04/05/2022   BILITOT 0.3 04/05/2022       Danelle Earthly, MD [facility] 04/06/2022, 1:54 PM

## 2022-04-07 ENCOUNTER — Inpatient Hospital Stay (HOSPITAL_COMMUNITY): Payer: Medicare Other | Admitting: Anesthesiology

## 2022-04-07 ENCOUNTER — Inpatient Hospital Stay: Payer: Medicare Other

## 2022-04-07 ENCOUNTER — Encounter (HOSPITAL_COMMUNITY): Payer: Self-pay

## 2022-04-07 ENCOUNTER — Encounter (HOSPITAL_COMMUNITY)
Admission: EM | Disposition: A | Discharge status: Home / Self Care | Payer: Self-pay | Source: Home / Self Care | Attending: Internal Medicine

## 2022-04-07 ENCOUNTER — Other Ambulatory Visit: Payer: Medicare Other

## 2022-04-07 ENCOUNTER — Ambulatory Visit: Payer: Medicare Other | Admitting: Hematology

## 2022-04-07 DIAGNOSIS — B957 Other staphylococcus as the cause of diseases classified elsewhere: Secondary | ICD-10-CM | POA: Diagnosis not present

## 2022-04-07 DIAGNOSIS — Z86711 Personal history of pulmonary embolism: Secondary | ICD-10-CM

## 2022-04-07 DIAGNOSIS — K921 Melena: Secondary | ICD-10-CM

## 2022-04-07 DIAGNOSIS — E785 Hyperlipidemia, unspecified: Secondary | ICD-10-CM

## 2022-04-07 DIAGNOSIS — Z87891 Personal history of nicotine dependence: Secondary | ICD-10-CM

## 2022-04-07 DIAGNOSIS — N189 Chronic kidney disease, unspecified: Secondary | ICD-10-CM

## 2022-04-07 DIAGNOSIS — K2210 Ulcer of esophagus without bleeding: Secondary | ICD-10-CM | POA: Diagnosis not present

## 2022-04-07 DIAGNOSIS — I129 Hypertensive chronic kidney disease with stage 1 through stage 4 chronic kidney disease, or unspecified chronic kidney disease: Secondary | ICD-10-CM

## 2022-04-07 DIAGNOSIS — K2100 Gastro-esophageal reflux disease with esophagitis, without bleeding: Secondary | ICD-10-CM

## 2022-04-07 DIAGNOSIS — D61810 Antineoplastic chemotherapy induced pancytopenia: Secondary | ICD-10-CM | POA: Diagnosis not present

## 2022-04-07 DIAGNOSIS — R7881 Bacteremia: Secondary | ICD-10-CM | POA: Diagnosis not present

## 2022-04-07 HISTORY — PX: ESOPHAGOGASTRODUODENOSCOPY (EGD) WITH PROPOFOL: SHX5813

## 2022-04-07 HISTORY — PX: HOT HEMOSTASIS: SHX5433

## 2022-04-07 HISTORY — PX: ENTEROSCOPY: SHX5533

## 2022-04-07 HISTORY — PX: SUBMUCOSAL INJECTION: SHX5543

## 2022-04-07 HISTORY — PX: SUBMUCOSAL TATTOO INJECTION: SHX6856

## 2022-04-07 LAB — CULTURE, BLOOD (ROUTINE X 2)
Special Requests: ADEQUATE
Special Requests: ADEQUATE

## 2022-04-07 LAB — CBC
HCT: 24.9 % — ABNORMAL LOW (ref 39.0–52.0)
Hemoglobin: 8.2 g/dL — ABNORMAL LOW (ref 13.0–17.0)
MCH: 30.4 pg (ref 26.0–34.0)
MCHC: 32.9 g/dL (ref 30.0–36.0)
MCV: 92.2 fL (ref 80.0–100.0)
Platelets: 124 10*3/uL — ABNORMAL LOW (ref 150–400)
RBC: 2.7 MIL/uL — ABNORMAL LOW (ref 4.22–5.81)
RDW: 15.5 % (ref 11.5–15.5)
WBC: 7.8 10*3/uL (ref 4.0–10.5)
nRBC: 0 % (ref 0.0–0.2)

## 2022-04-07 LAB — GLUCOSE, CAPILLARY
Glucose-Capillary: 125 mg/dL — ABNORMAL HIGH (ref 70–99)
Glucose-Capillary: 124 mg/dL — ABNORMAL HIGH (ref 70–99)
Glucose-Capillary: 174 mg/dL — ABNORMAL HIGH (ref 70–99)
Glucose-Capillary: 122 mg/dL — ABNORMAL HIGH (ref 70–99)

## 2022-04-07 LAB — BASIC METABOLIC PANEL
Anion gap: 2 — ABNORMAL LOW (ref 5–15)
BUN: 12 mg/dL (ref 8–23)
CO2: 26 mmol/L (ref 22–32)
Calcium: 7.6 mg/dL — ABNORMAL LOW (ref 8.9–10.3)
Chloride: 108 mmol/L (ref 98–111)
Creatinine, Ser: 0.85 mg/dL (ref 0.61–1.24)
GFR, Estimated: >60 mL/min (ref >60)
Glucose, Bld: 120 mg/dL — ABNORMAL HIGH (ref 70–99)
Potassium: 4.3 mmol/L (ref 3.5–5.1)
Sodium: 136 mmol/L (ref 135–145)

## 2022-04-07 LAB — MAGNESIUM: Magnesium: 2.2 mg/dL (ref 1.7–2.4)

## 2022-04-07 LAB — VANCOMYCIN, PEAK: Vancomycin Pk: 21 ug/mL — ABNORMAL LOW (ref 30–40)

## 2022-04-07 SURGERY — ESOPHAGOGASTRODUODENOSCOPY (EGD) WITH PROPOFOL
Anesthesia: General

## 2022-04-07 MED ORDER — STERILE WATER FOR IRRIGATION IR SOLN
Status: DC | PRN
  Administered 2022-04-07: 60 mL

## 2022-04-07 MED ORDER — LACTATED RINGERS IV SOLN: INTRAVENOUS | Status: DC

## 2022-04-07 MED ORDER — SPOT INK MARKER SYRINGE KIT
PACK | SUBMUCOSAL | Status: AC
  Filled 2022-04-07: qty 5

## 2022-04-07 MED ORDER — SPOT INK MARKER SYRINGE KIT
PACK | SUBMUCOSAL | Status: DC | PRN
  Administered 2022-04-07: 1 mL via SUBMUCOSAL

## 2022-04-07 MED ORDER — TRAVASOL 10 % IV SOLN
INTRAVENOUS | Status: DC
  Filled 2022-04-07: qty 780

## 2022-04-07 MED ORDER — PROPOFOL 500 MG/50ML IV EMUL
INTRAVENOUS | Status: DC | PRN
  Administered 2022-04-07: 180 ug/kg/min via INTRAVENOUS

## 2022-04-07 MED ORDER — SODIUM CHLORIDE (PF) 0.9 % IJ SOLN
PREFILLED_SYRINGE | INTRAMUSCULAR | Status: DC | PRN
  Administered 2022-04-07: 2 mL

## 2022-04-07 MED ORDER — PROPOFOL 500 MG/50ML IV EMUL
INTRAVENOUS | Status: AC
  Filled 2022-04-07: qty 50

## 2022-04-07 MED ORDER — PROPOFOL 10 MG/ML IV BOLUS
INTRAVENOUS | Status: DC | PRN
  Administered 2022-04-07: 80 mg via INTRAVENOUS

## 2022-04-07 MED ORDER — EPINEPHRINE 1 MG/10ML IJ SOSY
PREFILLED_SYRINGE | INTRAMUSCULAR | Status: AC
  Filled 2022-04-07: qty 10

## 2022-04-07 MED ORDER — SODIUM CHLORIDE 0.9 % IV SOLN: INTRAVENOUS | Status: DC

## 2022-04-07 NOTE — Transfer of Care (Signed)
Immediate Anesthesia Transfer of Care Note  Patient: [name]  Procedure(s) Performed: ESOPHAGOGASTRODUODENOSCOPY (EGD) WITH PROPOFOL ENTEROSCOPY SUBMUCOSAL TATTOO INJECTION SUBMUCOSAL INJECTION HOT HEMOSTASIS (ARGON PLASMA COAGULATION/BICAP)  Patient Location: PACU  Anesthesia Type:General  Level of Consciousness: drowsy, patient cooperative, and responds to stimulation  Airway & Oxygen Therapy: Patient Spontanous Breathing  Post-op Assessment: Report given to RN, Post -op Vital signs reviewed and stable, Patient moving all extremities X 4, and Patient able to stick tongue midline  Post vital signs: Reviewed  Last Vitals:  Vitals Value Taken Time  BP 136/63 04/07/22 1446  Temp 98.6   Pulse 92 04/07/22 1448  Resp 26 04/07/22 1448  SpO2 100 % 04/07/22 1448  Vitals shown include unvalidated device data.  Last Pain:  Vitals:   04/07/22 1355  TempSrc:   PainSc: 6          Complications: No notable events documented.

## 2022-04-07 NOTE — Consult Note (Signed)
[facility] Consult  Reason for Consult:Port-A-Cath removal Referring Physician: Barton Dubois MD  Chief Complaint   Abnormal Lab     HPI: [name] is a 72 y.o. male with history of pancreatic adenocarcinoma, colon adenocarcinoma status post resection May 2012, adenocarcinoma of the left lung status post resection, small bowel resection status post ileorectal anastomosis with short gut syndrome, iron deficiency anemia, pulmonary embolism on apixaban, hypertension, CKD stage III, GERD, remote tobacco and alcohol use (quit 2012) presenting with sore throat, fever, and chills. The symptoms started last Tuesday and he came to the ED on 1/12. He had associated nausea, weakness, and fatigue. He denies vomiting.   Patient with port a cath last used for chemo in October 2023, patient believes he will restart chemo in July 2024. He has had the port-a-cath for 11 years. Currently he is receiving TPN through it in anticipation of pancreatectomy 04/13/22. He denies ever having an infection related to the port in the past.   This morning he feels weak and tired, "lousy."  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   Stage 3   Anemia    Anxiety    Arthritis    Barrett's esophagus    Blood transfusion    Bowel obstruction ([place]) 05/13/2012   Recurrent   Bronchitis    Chest pain at rest    Chronic abdominal pain    Erosive esophagitis    ETOH abuse    quit 03/2010   GERD (gastroesophageal reflux disease)    Hx of Clostridium difficile infection 01/2012   Hypertension    Ileus ([place])    Iron deficiency anemia 03/23/2016   Lung cancer ([place])    Obstruction of bowel ([place]) 03/03/2014   Osteoporosis    Pancreatic cancer ([place])    Personal history of PE (pulmonary embolism) 10/01/2010   Pneumonia    Pulmonary embolism ([place]) 02/2010   Recurrent upper respiratory infection (URI)    Renal disorder    S/P endoscopy  09/28/2010   erosive reflux esophagitis, Billroth I anatomy   S/P partial gastrectomy 1980s   Seizures ([place])    was on meds for 6 months, Unknown etiology, last seizure was 2017.   Shortness of breath    TIA (transient ischemic attack) 12/2009   Vitamin B12 deficiency     Past Surgical History:  Procedure Laterality Date   ABDOMINAL ADHESION SURGERY  03/04/15   @ [facility]   ABDOMINAL EXPLORATION SURGERY     abdominal sugery     for bowel obstruction x 8, all in 1980s, except for one in 07/2010   APPENDECTOMY  1980s   Billroth 1 hemigastrectomy  1980s   per patient for benign duodenal tumor   BIOPSY  01/10/2022   Procedure: BIOPSY;  Surgeon: Eloise Harman, DO;  [facility];  Service: Endoscopy;;  gastric,ileum   CARDIAC CATHETERIZATION  07/17/2012   CHOLECYSTECTOMY  1980s   COLON SURGERY  May 2012   left hemicolectomy, colon cancer found at time of surgery for bowel obstruction   COLON SURGERY  10/12/2018   At [facility]: Resection of right colon and terminal ileum with creation of ileorectal anastomosis for large bowel obstruction.  Cecum and ascending colon noted to be attached to the rectum.   COLONOSCOPY  03/18/2011   anastomosis at 35cm. Several adenomatous polyps removed. Sigmoid diverticulosis. Next TCS 02/2013   COLONOSCOPY N/A 07/24/2012   EAV:[id number] post segmental resection with  normal-appearing colonic anastomosis aside from an adjacent polyp-removed as described above. Rectal polyp-removed as described above. CT findings appear to have been artifactual. tubular adenomas/prolapsed type polyp.   COLONOSCOPY N/A 05/15/2015   Procedure: COLONOSCOPY;  Surgeon: Corbin Ade, MD;  [facility];  Service: Endoscopy;  Laterality: N/A;   COLONOSCOPY  09/26/2018   at [facility]: prep not adequate for colon cancer surveillance.  Prior end-to-end colonic anastomosis in the rectosigmoid region.  This was patent and characterized by healthy-appearing mucosa.  Anastomosis was  traversed.  Normal terminal ileum.   COLONOSCOPY WITH PROPOFOL N/A 11/04/2016   Dr. Jena Gauss, status post subtotal colectomy with normal-appearing residual lower GI tract.  Next colonoscopy in 5 years.   ESOPHAGOGASTRODUODENOSCOPY  09/28/2010   ESOPHAGOGASTRODUODENOSCOPY  12/01/2010   Cervical web status post dilation, erosive esophagitis, B1 hemigastrectomy, inflamed anastomosis   ESOPHAGOGASTRODUODENOSCOPY  04/16/2011   excoriation at [facility] c/w trauma/M-W tear, friable gastric anastomosis, dilation efferent limb   ESOPHAGOGASTRODUODENOSCOPY N/A 06/03/2014   Dr.[name]- cervcal esopphageal web s/p dilation. abnormal distal esophagus bx= barretts esophagus   ESOPHAGOGASTRODUODENOSCOPY N/A 05/15/2015   Procedure: ESOPHAGOGASTRODUODENOSCOPY (EGD);  Surgeon: Corbin Ade, MD;  [facility];  Service: Endoscopy;  Laterality: N/A;  230   ESOPHAGOGASTRODUODENOSCOPY (EGD) WITH ESOPHAGEAL DILATION  02/25/2012   SIE:[id number] esophageal web-s/p dilation anddisruption as described above. Status post prior gastric with Billroth I configuration. Abnormal gastric mucosa at the anastomosis. Gastric biopsy showed mild chronic inflammation but no H. pylori    ESOPHAGOGASTRODUODENOSCOPY (EGD) WITH PROPOFOL N/A 11/04/2016   Dr. Jena Gauss: Reflux esophagitis, small hiatal hernia status post hemigastrectomy.  Single patent efferent small bowel limb appeared normal, 2 x 2 centimeter tongue of salmon epithelium again seen, esophageal dilation.  Biopsies consistent with reflux changes, not Barrett's however this was confirmed on prior EGDs.  Offer 3-year follow-up EGD August 2021.   ESOPHAGOGASTRODUODENOSCOPY (EGD) WITH PROPOFOL N/A 03/03/2020   Procedure: ESOPHAGOGASTRODUODENOSCOPY (EGD) WITH PROPOFOL;  Surgeon: Corbin Ade, MD;  [facility];  Service: Endoscopy;  Laterality: N/A;  3:00pm   ESOPHAGOGASTRODUODENOSCOPY (EGD) WITH PROPOFOL N/A 01/10/2022   Procedure: ESOPHAGOGASTRODUODENOSCOPY (EGD) WITH PROPOFOL;   Surgeon: Lanelle Bal, DO;  [facility];  Service: Endoscopy;  Laterality: N/A;   FLEXIBLE SIGMOIDOSCOPY N/A 01/10/2022   Procedure: FLEXIBLE SIGMOIDOSCOPY;  Surgeon: Lanelle Bal, DO;  [facility];  Service: Endoscopy;  Laterality: N/A;   HERNIA REPAIR     right inguinal   MALONEY DILATION N/A 06/03/2014   Procedure: Elease Hashimoto DILATION;  Surgeon: Corbin Ade, MD;  [facility];  Service: Endoscopy;  Laterality: N/A;   MALONEY DILATION N/A 05/15/2015   Procedure: Elease Hashimoto DILATION;  Surgeon: Corbin Ade, MD;  [facility];  Service: Endoscopy;  Laterality: N/A;   MALONEY DILATION N/A 11/04/2016   Procedure: Elease Hashimoto DILATION;  Surgeon: Corbin Ade, MD;  [facility];  Service: Endoscopy;  Laterality: N/A;   MALONEY DILATION N/A 03/03/2020   Procedure: Elease Hashimoto DILATION;  Surgeon: Corbin Ade, MD;  [facility];  Service: Endoscopy;  Laterality: N/A;   PORTACATH PLACEMENT     SAVORY DILATION N/A 06/03/2014   Procedure: SAVORY DILATION;  Surgeon: Corbin Ade, MD;  [facility];  Service: Endoscopy;  Laterality: N/A;    Family History  Problem Relation Age of Onset   Hypertension Mother    Arthritis Mother    Pneumonia Mother    Hypertension  Father    Heart attack Father    Bone cancer Cousin    Colon cancer Neg Hx     Social History   Tobacco Use   Smoking status: Former    Packs/day: 0.50    Years: 40.00    Total pack years: 20.00    Types: Cigarettes    Quit date: 12/20/2012    Years since quitting: 9.3   Smokeless tobacco: Never  Vaping Use   Vaping Use: Never used  Substance Use Topics   Alcohol use: No   Drug use: No    Medications: Prior to Admission:  Medications Prior to Admission  Medication Sig Dispense Refill Last Dose   apixaban (ELIQUIS) 5 MG TABS tablet Take 1 tablet (5 mg total) by mouth 2 (two) times daily. Restart 01/11/22 60 tablet 6 04/03/2022 at 0900    atorvastatin (LIPITOR) 20 MG tablet Take 20 mg by mouth daily.   04/03/2022   azithromycin (ZITHROMAX) 250 MG tablet Take 250 mg by mouth as directed.   04/03/2022   Bacillus Coagulans-Inulin (PROBIOTIC) 1-250 BILLION-MG CAPS Take 1 capsule by mouth daily.   04/03/2022   chlorproMAZINE (THORAZINE) 25 MG tablet Take 1 tablet (25 mg total) by mouth every 6 (six) hours as needed. (Patient taking differently: Take 25 mg by mouth every 6 (six) hours as needed for nausea or vomiting.) 30 tablet 0 04/03/2022   Cholecalciferol (VITAMIN D) 2000 units CAPS Take 2,000 Units by mouth daily.   04/03/2022   Cholecalciferol (VITAMIN D3) 10 MCG (400 UNIT) tablet Take 400 Units by mouth daily.   04/03/2022   CREON 36000-114000 units CPEP capsule Take 72,000-108,000 Units by mouth See admin instructions. Take 3 capsules (108,000 units) by mouth with meals and take 2 capsules (72,000 units) with snacks.   04/03/2022   cyanocobalamin 100 MCG tablet Take by mouth.   04/03/2022   diclofenac Sodium (VOLTAREN) 1 % GEL Apply 4 g topically 4 (four) times daily.   unknown   dicyclomine (BENTYL) 10 MG capsule Take 10 mg by mouth 2 (two) times daily before a meal.   04/03/2022   diphenoxylate-atropine (LOMOTIL) 2.5-0.025 MG tablet Take 1 tablet by mouth 4 times daily 120 tablet 1 04/03/2022   escitalopram (LEXAPRO) 20 MG tablet Take 1 tablet (20 mg total) by mouth daily. 90 tablet 0 04/03/2022   ferrous sulfate 325 (65 FE) MG EC tablet Take by mouth.   04/03/2022   gabapentin (NEURONTIN) 100 MG capsule TAKE 1 CAPSULE BY MOUTH EVERY DAY AT BEDTIME (Patient taking differently: Take 100 mg by mouth at bedtime.) 30 capsule 6 04/02/2022   HYDROcodone-acetaminophen (NORCO/VICODIN) 5-325 MG tablet Take 1 tablet by mouth every 8 (eight) hours as needed for moderate pain. 45 tablet 0 04/03/2022   hydrocortisone cream 1 % Apply 1 Application topically 2 (two) times daily.   Past Week   magnesium oxide (MAG-OX) 400 (240 Mg) MG tablet TAKE 1 TABLET BY  MOUTH TWICE A DAY 180 tablet 3 04/02/2022   megestrol (MEGACE) 400 MG/10ML suspension Take 10 mLs (400 mg total) by mouth 2 (two) times daily. 480 mL 1 04/03/2022   mirtazapine (REMERON) 30 MG tablet TAKE 1 TABLET BY MOUTH AT BEDTIME 30 tablet 3 04/02/2022   Multiple Vitamin (INFUVITE ADULT IV) 5 mLs See admin instructions. Inject 5 mls from each vial #1 and #2   04/02/2022   Multiple Vitamin (MULTIVITAMIN WITH MINERALS) TABS tablet Take 1 tablet by mouth daily.   04/03/2022   ondansetron (  ZOFRAN-ODT) 4 MG disintegrating tablet TAKE 1 TABLET BY MOUTH EVERY 4 HOURS AS NEEDED FOR NAUSEA AND VOMITING 30 tablet 1 04/03/2022   pantoprazole (PROTONIX) 40 MG tablet Take 1 tablet (40 mg total) by mouth 2 (two) times daily before a meal. 180 tablet 3 04/03/2022   PREVALITE 4 g packet Take 1 packet (4 g total) by mouth daily. DO NOT TAKE WITHIN 2 HOURS OF OTHER MEDICATIONS 90 packet 3 04/02/2022   prochlorperazine (COMPAZINE) 10 MG tablet Take 10 mg by mouth every 6 (six) hours as needed for vomiting or nausea.   04/02/2022   sterile water SOLN with Travasol 10 % SOLN 50 g/L, dextrose 70 % SOLN 20 %, sodium INJ 35 mEq/L, potassium INJ 30 mEq/L, calcium GLUCONATE INJ 4.7 mEq/L, magnesium SULFATE INJ 5 mEq/L, phosphorus INJ 15 mmol/L, M.V.I. Adult INJ 10 mL, trace elements Cr-Cu-Mn-Se-Zn (MTE-5 Concentrate) 12-998-500-60 MCG/ML SOLN 1 mL, fat emulsion 30 % EMUL 100 mL/L continuous.   04/02/2022   sucralfate (CARAFATE) 1 GM/10ML suspension TAKE 10 MLS (1 G TOTAL) BY MOUTH 4 (FOUR) TIMES DAILY AS NEEDED. FOR BREAKTHROUGH HEARTBURN (Patient taking differently: Take 1 g by mouth 4 (four) times daily as needed (breakthrough heartburn).) 420 mL 5 Past Week   traMADol (ULTRAM) 50 MG tablet Take 1 tablet by mouth every 6 (six) hours as needed for moderate pain or severe pain.   Past Week   Scheduled:  acidophilus  1 capsule Oral Daily   atorvastatin  20 mg Oral Daily   Chlorhexidine Gluconate Cloth  6 each Topical Daily    cholestyramine light  4 g Oral Daily   dicyclomine  10 mg Oral TID AC & HS   diphenoxylate-atropine  1 tablet Oral QID   escitalopram  20 mg Oral Daily   gabapentin  100 mg Oral QHS   insulin aspart  0-9 Units Subcutaneous Q8H   lipase/protease/amylase  108,000 Units Oral TID WC   megestrol  400 mg Oral BID   mirtazapine  30 mg Oral QHS   pantoprazole  40 mg Oral BID AC   Continuous:  TPN ADULT (ION) 65 mL/hr at 04/07/22 0600   vancomycin Stopped (04/06/22 2003)   ELT:[id number], HYDROcodone-acetaminophen, lipase/protease/amylase, ondansetron **OR** ondansetron (ZOFRAN) IV, polyethylene glycol, prochlorperazine Anti-infectives (From admission, onward)    Start     Dose/Rate Route Frequency Ordered Stop   04/04/22 1900  vancomycin (VANCOREADY) IVPB 750 mg/150 mL        750 mg 150 mL/hr over 60 Minutes Intravenous Every 24 hours 04/03/22 1927     04/03/22 1815  vancomycin (VANCOCIN) IVPB 1000 mg/200 mL premix        1,000 mg 200 mL/hr over 60 Minutes Intravenous  Once 04/03/22 1808 04/03/22 2016       No Known Allergies   ROS:  Pertinent items are noted in HPI.  Blood pressure 131/71, pulse 74, temperature (!) 97.1 F (36.2 C), resp. rate 18, height 5\' 8"  (1.727 m), weight 54.4 kg, SpO2 97 %. Physical Exam  Constitutional: thin, chronically ill appearing elderly gentleman in no distress lying in bed HEENT: normocephalic, atraumatic, mucous membranes dry Neck: Supple Pulmonary: No increased work of breathing on room air MSK: Moves all extremities spontaneously Skin: port-a-cath in place, partially obscured by tape, no obvious erythema, swelling, warmth Neurological: No focal deficit  Results: Results for orders placed or performed during the hospital encounter of 04/03/22 (from the past 48 hour(s))  Glucose, capillary     Status: Abnormal  Collection Time: 04/05/22 11:15 AM  Result Value Ref Range   Glucose-Capillary 175 (H) 70 - 99 mg/dL    Comment: Glucose  reference range applies only to samples taken after fasting for at least 8 hours.  Culture, blood (single) w Reflex to ID Panel     Status: None (Preliminary result)   Collection Time: 04/05/22  4:30 PM   Specimen: Right Antecubital; Blood  Result Value Ref Range   Specimen Description      RIGHT ANTECUBITAL Performed at [facility], [address], [place], [place] [address]    Special Requests      BOTTLES DRAWN AEROBIC AND ANAEROBIC Blood Culture adequate volume Performed at [facility], [address] [place], [place] [address]    Culture  Setup Time      GRAM POSITIVE COCCI Gram Stain Report Called to,Read Back By and Verified With: [email or web address]  BY MATTHEWS, B 1.16.2024 IN BOTH AEROBIC AND ANAEROBIC BOTTLES CRITICAL VALUE NOTED.  VALUE IS CONSISTENT WITH PREVIOUSLY REPORTED AND CALLED VALUE. Performed at [facility], [place] [address] [place], [place] [address]    Culture GRAM POSITIVE COCCI    Report Status PENDING   Glucose, capillary     Status: Abnormal   Collection Time: 04/05/22  4:30 PM  Result Value Ref Range   Glucose-Capillary 105 (H) 70 - 99 mg/dL    Comment: Glucose reference range applies only to samples taken after fasting for at least 8 hours.  Glucose, capillary     Status: Abnormal   Collection Time: 04/05/22 10:17 PM  Result Value Ref Range   Glucose-Capillary 120 (H) 70 - 99 mg/dL    Comment: Glucose reference range applies only to samples taken after fasting for at least 8 hours.   Comment 1 Notify RN    Comment 2 Document in Chart   Glucose, capillary     Status: Abnormal   Collection Time: 04/06/22  4:09 AM  Result Value Ref Range   Glucose-Capillary 108 (H) 70 - 99 mg/dL    Comment: Glucose reference range applies only to samples taken after fasting for at least 8 hours.   Comment 1 Notify RN    Comment 2 Document in Chart   Basic metabolic panel     Status: Abnormal   Collection Time: 04/06/22  5:24 AM  Result Value Ref Range   Sodium 134  (L) 135 - 145 mmol/L   Potassium 4.3 3.5 - 5.1 mmol/L   Chloride 106 98 - 111 mmol/L   CO2 23 22 - 32 mmol/L   Glucose, Bld 118 (H) 70 - 99 mg/dL    Comment: Glucose reference range applies only to samples taken after fasting for at least 8 hours.   BUN 11 8 - 23 mg/dL   Creatinine, Ser 1.03 0.61 - 1.24 mg/dL   Calcium 7.2 (L) 8.9 - 10.3 mg/dL   GFR, Estimated >60 >60 mL/min    Comment: (NOTE) Calculated using the CKD-EPI Creatinine Equation (2021)    Anion gap 5 5 - 15    Comment: Performed at [facility], [address], [place], [place] [address]  CBC     Status: Abnormal   Collection Time: 04/06/22  5:24 AM  Result Value Ref Range   WBC 7.9 4.0 - 10.5 K/uL   RBC 2.65 (L) 4.22 - 5.81 MIL/uL   Hemoglobin 8.2 (L) 13.0 - 17.0 g/dL   HCT 24.7 (L) 39.0 - 52.0 %   MCV 93.2 80.0 - 100.0  fL   MCH 30.9 26.0 - 34.0 pg   MCHC 33.2 30.0 - 36.0 g/dL   RDW 66.7 33.8 - 83.2 %   Platelets 96 (L) 150 - 400 K/uL    Comment: SPECIMEN CHECKED FOR CLOTS Immature Platelet Fraction may be clinically indicated, consider ordering this additional test [id number] CONSISTENT WITH PREVIOUS RESULT    nRBC 0.0 0.0 - 0.2 %    Comment: Performed at [facility], [address] [place], [place] [address]  Phosphorus     Status: None   Collection Time: 04/06/22  5:24 AM  Result Value Ref Range   Phosphorus 3.1 2.5 - 4.6 mg/dL    Comment: Performed at [facility], [address] [place], [place] [address]  Magnesium     Status: None   Collection Time: 04/06/22  5:24 AM  Result Value Ref Range   Magnesium 1.7 1.7 - 2.4 mg/dL    Comment: Performed at [facility], [address], [place], [place] [address]  Glucose, capillary     Status: Abnormal   Collection Time: 04/06/22  6:02 AM  Result Value Ref Range   Glucose-Capillary 138 (H) 70 - 99 mg/dL    Comment: Glucose reference range applies only to samples taken after fasting for at least 8 hours.  Glucose, capillary     Status: Abnormal   Collection  Time: 04/06/22  7:58 AM  Result Value Ref Range   Glucose-Capillary 101 (H) 70 - 99 mg/dL    Comment: Glucose reference range applies only to samples taken after fasting for at least 8 hours.  Glucose, capillary     Status: Abnormal   Collection Time: 04/06/22 11:10 AM  Result Value Ref Range   Glucose-Capillary 129 (H) 70 - 99 mg/dL    Comment: Glucose reference range applies only to samples taken after fasting for at least 8 hours.  Glucose, capillary     Status: Abnormal   Collection Time: 04/06/22  3:57 PM  Result Value Ref Range   Glucose-Capillary 135 (H) 70 - 99 mg/dL    Comment: Glucose reference range applies only to samples taken after fasting for at least 8 hours.   Comment 1 Notify RN    Comment 2 Document in Chart   Glucose, capillary     Status: None   Collection Time: 04/06/22  8:44 PM  Result Value Ref Range   Glucose-Capillary 96 70 - 99 mg/dL    Comment: Glucose reference range applies only to samples taken after fasting for at least 8 hours.  Basic metabolic panel     Status: Abnormal   Collection Time: 04/07/22  4:02 AM  Result Value Ref Range   Sodium 136 135 - 145 mmol/L   Potassium 4.3 3.5 - 5.1 mmol/L   Chloride 108 98 - 111 mmol/L   CO2 26 22 - 32 mmol/L   Glucose, Bld 120 (H) 70 - 99 mg/dL    Comment: Glucose reference range applies only to samples taken after fasting for at least 8 hours.   BUN 12 8 - 23 mg/dL   Creatinine, Ser 2.39 0.61 - 1.24 mg/dL   Calcium 7.6 (L) 8.9 - 10.3 mg/dL   GFR, Estimated >88 >30 mL/min    Comment: (NOTE) Calculated using the CKD-EPI Creatinine Equation (2021)    Anion gap 2 (L) 5 - 15    Comment: Electrolytes repeated to confirm. Performed at [facility], [address] [place], [place] [address]   Magnesium  Status: None   Collection Time: 04/07/22  4:02 AM  Result Value Ref Range   Magnesium 2.2 1.7 - 2.4 mg/dL    Comment: Performed at [facility], [address], [place], [place] [address]  CBC     Status:  Abnormal   Collection Time: 04/07/22  4:02 AM  Result Value Ref Range   WBC 7.8 4.0 - 10.5 K/uL   RBC 2.70 (L) 4.22 - 5.81 MIL/uL   Hemoglobin 8.2 (L) 13.0 - 17.0 g/dL   HCT 53.6 (L) 76.1 - 14.1 %   MCV 92.2 80.0 - 100.0 fL   MCH 30.4 26.0 - 34.0 pg   MCHC 32.9 30.0 - 36.0 g/dL   RDW 12.5 93.2 - 72.8 %   Platelets 124 (L) 150 - 400 K/uL   nRBC 0.0 0.0 - 0.2 %    Comment: Performed at [facility], [address], [place], [place] [address]  Glucose, capillary     Status: Abnormal   Collection Time: 04/07/22  4:26 AM  Result Value Ref Range   Glucose-Capillary 125 (H) 70 - 99 mg/dL    Comment: Glucose reference range applies only to samples taken after fasting for at least 8 hours.    No results found.   Assessment & Plan:  [name] is a 72 y.o. male with history of pancreatic adenocarcinoma, colon adenocarcinoma status post resection May 2012, adenocarcinoma of the left lung status post resection, small bowel resection status post ileorectal anastomosis with short gut syndrome, iron deficiency anemia, pulmonary embolism on apixaban, hypertension, CKD stage III, GERD, remote tobacco and alcohol use (quit 2012) admitted with staph bacteremia.  Port a cath removal  Methicillin resistant coagulase negative staphylococcus bacteremia Multiple blood cultures positive for staphylococcus epidermidis and staphylococcus hominis.  Port has been in place for 11 years, began receiving TPN through port on 03/19/22. Port is a likely source of infection, surgery consulted for removal. Patient has been on vanc since 1/13.  - last apixaban 0837 on 04/04/22 - plan for port removal tomorrow - NPO at midnight - Continue antibiotics per primary team, ID also consulted   Deneisha Dade [facility] MS3 04/07/2022, 7:58 AM

## 2022-04-07 NOTE — Interval H&P Note (Signed)
History and Physical Interval Note:  04/07/2022 1:51 PM  [name]  has presented today for surgery, with the diagnosis of melena, acute on chronic anemia.  The various methods of treatment have been discussed with the patient and family. After consideration of risks, benefits and other options for treatment, the patient has consented to  Procedure(s): ESOPHAGOGASTRODUODENOSCOPY (EGD) WITH PROPOFOL (N/A) ENTEROSCOPY (N/A) as a surgical intervention.  The patient's history has been reviewed, patient examined, no change in status, stable for surgery.  I have reviewed the patient's chart and labs.  Questions were answered to the patient's satisfaction.     [name]   patient stable overnight.  Hemoglobin 8.2 patient  has  developed major health challenges since I  Lastlast saw him a few years ago.   agree with need for EGD/enteroscopy to see  if a bleeding lesion can be found and treated.The risks, benefits, limitations, alternatives and imponderables have been reviewed with the patient. Potential for esophageal dilation, biopsy, etc. have also been reviewed.  Questions have been answered. All parties agreeable.    Further recommendations to follow after endoscopic evaluation has been carried out.

## 2022-04-07 NOTE — Op Note (Signed)
[facility] Patient Name: [name] Procedure Date: 04/07/2022 1:27 PM MRN: [id number] Date of Birth: 1950/04/24 Attending MD: Gennette Pac , MD, [id number] CSN: [id number] Age: 72 Admit Type: Inpatient Procedure:                Upper GI endoscopy Indications:              Melena Providers:                Gennette Pac, MD, Angelica Ran, Lennice Sites                            Technician, Technician Referring MD:              Medicines:                Propofol per Anesthesia Complications:            No immediate complications. Estimated blood loss:                            None. Estimated Blood Loss:     Estimated blood loss was minimal. Procedure:                Pre-Anesthesia Assessment:                           - Prior to the procedure, a History and Physical                            was performed, and patient medications and                            allergies were reviewed. The patient's tolerance of                            previous anesthesia was also reviewed. The risks                            and benefits of the procedure and the sedation                            options and risks were discussed with the patient.                            All questions were answered, and informed consent                            was obtained. Prior Anticoagulants: The patient                            last took Eliquis (apixaban) 5 days prior to the                            procedure. ASA Grade Assessment: III - A patient  with severe systemic disease. After reviewing the                            risks and benefits, the patient was deemed in                            satisfactory condition to undergo the procedure.                           After obtaining informed consent, the endoscope was                            passed under direct vision. Throughout the                            procedure, the patient's blood  pressure, pulse, and                            oxygen saturations were monitored continuously. The                            [id number]-[id number] ([id number]) was introduced through the                            mouth, and advanced to the mid-jejunum. The upper                            GI endoscopy was accomplished without difficulty.                            The patient tolerated the procedure well. Scope In: 2:03:12 PM Scope Out: 2:41:12 PM Total Procedure Duration: 0 hours 38 minutes 0 seconds  Findings:      Couple of small distal esophageal erosions at the GE junction.       Undulating, "ratty" appearing Z-line. Surgically altered stomach.       Identified 1 small bowel limb. Residual gastric mucosa initially. Normal       on initial survey with a pediatric colonoscope advanced to the       colonoscope downstream well into the jejunum at least the proximal       jejunum was felt to have been reached. The small bowel at this level       appeared normal I could not advance the scope further. I made 2 ink       marks to demarcate the extent of survey. From this level the scope was       slowly withdrawn back into the stomach and the anastomosis identified       fresh blood coming from the anastomosis right at the junction slightly       on the gastric side. There appeared to be some adherent clot and a       slight excavation. No doubt there was some trauma here from advancement       of the scope however this did not look entirely consistent with scope       trauma. This was observed for some time. There was some fresh blood  coming from it. The remaining residual gastric mucosa appeared       unremarkable. I elected to place 2 cc of 1-10,000 epinephrine       submucosally around this lesion and I sealed it with APC on stomach       settings at 20 J each?"multiple applications with good hemostasis       achieved. Impression:               -Mild erosive reflux esophagitis. Surgically                             altered stomach consistent with prior surgical                            history. Small bowel to the at least a proximal                            jejunum appeared normal. Proximal extent of                            surveyed demarcated with ink.                           -Oozing mucosa at the anastomosis. I suspect an                            element of scope trauma. However, this was found to                            be suspicious for an occult bleeding lesion (was                            not seen upon advancement of the scope initially).                            Status post epinephrine injection and APC treatment.                           ; This may or may not be the major lesion producing                            melena. It is possible patient could have a lesion                            further downstream. Also, with his history of                            pancreatic cancer cannot rule out an element of                            hemosuccus pancreaticus                           -No specimen collected Moderate Sedation:  Moderate (conscious) sedation was personally administered by an       anesthesia professional. The following parameters were monitored: oxygen       saturation, heart rate, blood pressure, and response to care. Recommendation:           - Return patient to hospital ward for ongoing care.                           - Clear liquid diet.                           - Continue present medications. Continue PPI. Trend                            H&H. At patient request, I called [name],                            spouse, [phone]. Reviewed my findings and                            recommendations in detail. Questions answered. Procedure Code(s):        --- Professional ---                           [phone], Esophagogastroduodenoscopy, flexible,                            transoral; diagnostic, including collection  of                            specimen(s) by brushing or washing, when performed                            (separate procedure) Diagnosis Code(s):        --- Professional ---                           K92.1, Melena (includes Hematochezia) CPT copyright 2022 [employer]. All rights reserved. The codes documented in this report are preliminary and upon coder review may  be revised to meet current compliance requirements. [name]. [name] Mcglocklin, MD Gennette Pac, MD 04/07/2022 2:58:26 PM This report has been signed electronically. Number of Addenda: 0

## 2022-04-07 NOTE — Progress Notes (Signed)
PROGRESS NOTE  [name] EEG:[id number] DOB: [birth year] DOA: 04/03/2022 PCP: Romeo Rabon, MD  Brief History:  72 year old male with a history of pancreatic adenocarcinoma, colon adenocarcinoma status post resection May 2012, adenocarcinoma of the left lung status post resection, small bowel resection status post ileorectal anastomosis with short gut syndrome, iron deficiency anemia, pulmonary embolism on apixaban, hypertension, CKD stage III, GERD, remote tobacco and alcohol use (quit 2012) presenting with fevers, chills, and rigors of 5 days duration.  The patient initially visited the emergency department on 04/02/2021 with fevers, chills, and nausea.  Blood cultures were obtained during that visit.  He was given ceftriaxone, azithromycin, and IV fluids.  His BP improved and tachycardia improved and he was discharged home in stable condition.  He was called back to come to the emergency department because of positive blood cultures.  He continued to have nausea, fevers, chills.  He complains of generalized weakness and shortness of breath.  He denies any current emesis.  He states that he has chronic diarrhea, 4-5 bowel movements daily.  He denies hematochezia but has occasional melena.  He denies any headache, neck pain, coughing, hemoptysis. Notably, the patient was recently admitted to the hospital from 01/08/2022 to 01/11/2022 due to symptomatic anemia.  He was given 2 unit PRBC.  He will underwent EGD on 01/10/2022 which showed grade a reflux esophagitis without bleeding.  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  01/11/2022 flex sig showed patent side-to-side ileal colonic anastomosis with healthy-appearing mucosa.  There was diffuse moderate inflammation at the colonic anastomosis.  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  His apixaban was restarted on 01/11/2022. In the ED, the patient had a low-grade temperature of 99.8 F.  His blood pressure was soft  with systolic blood pressures in the 100s.  Oxygen saturation was 99% on room air.  WBC 4.2, hemoglobin 10.7, platelets 24,000.  Sodium 130, potassium 3.0, bicarbonate 22, serum creatinine 1.09.  Blood cultures are showing gram-positive cocci in 2 out of 2 sets.  The patient was started on vancomycin.   Assessment/Plan: Bacteremia-CoNS -04/02/2022 blood culture--S epi and S hominis 2 out of 2 sets - 04/03/22 blood culture--S epi and S hominis -04/04/22 TTE--EF 60-65%, no vegetation -04/05/22 blood culture>>GPC -1/14 Echo EF 60-65%, G1DD, no vegetations, normal RVF -ID eval appreciated; recommending>>remove port-a-cath and to follow-up with TEE. -Continue current IV antibiotics at the moment.  Symptomatic anemia/melena -Once again, the patient's hemoglobin has drifted down from 11.6 on 03/17/22 -04/04/2021 hemoglobin 7.7>>7.4 -GI consult appreciated>> continue n.p.o. status; planning for endoscopic evaluation later today. -Patient does take iron supplementation -Patient received transfusion of 1 unit PRBC on 04/04/22 -Continue to follow hemoglobin trend.  Hyponatremia -Secondary to poor solute intake and volume depletion -Continue IV normal saline -Electrolytes trend.   Hypokalemia -Continue to follow electrolytes and further replete as needed -Magnesium within normal limits.   Hypophosphatemia -repleted  -Continue to follow electrolytes trend and further replete as needed.   Pancreatic adenocarcinoma -Status post FOLFIRINOX 10/16/2021>> 12/30/2021 -Patient was started on TPN 03/19/2022 to improve his nutritional status for pancreatic tail resection -Pancreatic tail resection and splenectomy has been put on hold secondary to his poor nutritional status. -Continue outpatient follow-up with oncology service.   Iron deficiency anemia -04/20/2022 iron saturation 50 -Follow hemoglobin trend and transfuse as needed.   Pancytopenia due to antineoplastic chemotherapy [facility]) -Last FOLFIRINOX  01/01/22 -Continue to follow serial CBC -Anticipated lower  hemoglobin level in the setting of acute bleed.   CKD 3a -baseline creatinine 1.0-1.3 -Appears to be stable and at baseline currently -Continue to maintain adequate hydration.   Chronic abdominal pain -Patient has chronic left-sided abdominal pain -Previously attributable to the patient's malignancy, unchanged in the past month -01/06/2022 MRI abdomen--area of restricted diffusion in the distal pancreas 2.0 x 1.9 cm.  slightly smaller mass in the splenic hilum -Continue as needed analgesics.   Diarrhea -Cdiff colonization documented 12/2021 -Continue symptomatic management with the use of loperamide and lomotil prn   HTN (hypertension) -No longer take carvedilol -Patient states the blood pressures have remained soft at home with SBP in the 90s. -Heart healthy discussed with patient -Continue to follow vital signs.   Personal history of PE (pulmonary embolism) -Patient was taking apixaban prior to admission -Continue to hold apixaban given acute GI bleed concerns.       Family Communication:  spouse updated 1/15   Consultants:  GI, ID   Code Status:  FULL    DVT Prophylaxis:  SCDs     Procedures: As Listed in Progress Note Above   Antibiotics: Vanco 04/03/22>>   Subjective: Patient reporting some dark stools; no chest pain, no nausea or vomiting.  Afebrile and denies chest pain.  Good saturation on room air.  Objective: Vitals:   04/07/22 1209 04/07/22 1445 04/07/22 1500 04/07/22 1534  BP: (!) 141/79 136/63 132/67 133/81  Pulse: 78 89 72 70  Resp: 15 (!) 25 15 20   Temp: 98 F (36.7 C) 98.6 F (37 C)  98.5 F (36.9 C)  TempSrc: Oral   Oral  SpO2: 96% 99% 97% 99%  Weight: 54.4 kg     Height: 5\' 8"  (1.727 m)       Intake/Output Summary (Last 24 hours) at 04/07/2022 1654 Last data filed at 04/07/2022 1500 Gross per 24 hour  Intake 999.22 ml  Output 600 ml  Net 399.22 ml   Weight change:    Exam: General exam: Alert, awake, oriented x 3; no chest pain, no nausea, no vomiting.  Currently afebrile. Respiratory system: Clear to auscultation. Respiratory effort normal.  Good saturation on room air.  No using accessory muscles. Cardiovascular system: No rubs, no gallops, no JVD on exam. Gastrointestinal system: Abdomen is nondistended, soft and nontender. No organomegaly or masses felt. Normal bowel sounds heard. Central nervous system: Alert and oriented. No focal neurological deficits. Extremities: No cyanosis or clubbing. Skin: No petechiae. Psychiatry: Judgement and insight appear normal. Mood & affect appropriate.   Data Reviewed: I have personally reviewed following labs and imaging studies  Basic Metabolic Panel: Recent Labs  Lab 04/03/22 1623 04/04/22 0456 04/05/22 0524 04/06/22 0524 04/07/22 0402  NA 130* 135 136 134* 136  K 3.0* 3.0* 4.2 4.3 4.3  CL 102 107 109 106 108  CO2 22 20* 20* 23 26  GLUCOSE 94 135* 123* 118* 120*  BUN 15 14 11 11 12   CREATININE 1.09 1.01 0.98 1.03 0.85  CALCIUM 6.8* 7.1* 7.1* 7.2* 7.6*  MG  --  2.4 2.1 1.7 2.2  PHOS  --  2.7 2.1* 3.1  --    Liver Function Tests: Recent Labs  Lab 04/02/22 1401 04/04/22 0456 04/05/22 0524  AST 38 27 19  ALT 36 24 20  ALKPHOS 80 73 69  BILITOT 0.5 0.2* 0.3  PROT 5.4* 4.5* 4.2*  ALBUMIN 1.9* 1.6* 1.5*   Coagulation Profile: Recent Labs  Lab 04/02/22 1401  INR 1.5*  CBC: Recent Labs  Lab 04/02/22 1401 04/03/22 1623 04/04/22 0456 04/05/22 0524 04/06/22 0524 04/07/22 0402  WBC 3.5* 4.2 4.4 5.5 7.9 7.8  NEUTROABS 2.9 3.2  --   --   --   --   HGB 9.0* 7.7* 7.7* 7.4* 8.2* 8.2*  HCT 27.1* 23.3* 23.3* 22.6* 24.7* 24.9*  MCV 93.4 92.8 92.8 93.8 93.2 92.2  PLT 81* 74* 69* 71* 96* 124*   CBG: Recent Labs  Lab 04/06/22 1110 04/06/22 1557 04/06/22 2044 04/07/22 0426 04/07/22 1119  GLUCAP 129* 135* 96 125* 124*   Urine analysis:    Component Value Date/Time   COLORURINE  YELLOW 04/02/2022 1533   APPEARANCEUR HAZY (A) 04/02/2022 1533   LABSPEC 1.013 04/02/2022 1533   PHURINE 5.0 04/02/2022 1533   GLUCOSEU NEGATIVE 04/02/2022 1533   HGBUR NEGATIVE 04/02/2022 1533   BILIRUBINUR NEGATIVE 04/02/2022 1533   KETONESUR NEGATIVE 04/02/2022 1533   PROTEINUR 30 (A) 04/02/2022 1533   UROBILINOGEN 0.2 11/30/2014 1115   NITRITE NEGATIVE 04/02/2022 1533   LEUKOCYTESUR NEGATIVE 04/02/2022 1533   Sepsis Labs:  Recent Results (from the past 240 hour(s))  Resp panel by RT-PCR (RSV, Flu A&B, Covid) Anterior Nasal Swab     Status: None   Collection Time: 04/02/22 12:36 PM   Specimen: Anterior Nasal Swab  Result Value Ref Range Status   SARS Coronavirus 2 by RT PCR NEGATIVE NEGATIVE Final    Comment: (NOTE) SARS-CoV-2 target nucleic acids are NOT DETECTED.  The SARS-CoV-2 RNA is generally detectable in upper respiratory specimens during the acute phase of infection. The lowest concentration of SARS-CoV-2 viral copies this assay can detect is 138 copies/mL. A negative result does not preclude SARS-Cov-2 infection and should not be used as the sole basis for treatment or other patient management decisions. A negative result may occur with  improper specimen collection/handling, submission of specimen other than nasopharyngeal swab, presence of viral mutation(s) within the areas targeted by this assay, and inadequate number of viral copies(<138 copies/mL). A negative result must be combined with clinical observations, patient history, and epidemiological information. The expected result is Negative.  Fact Sheet for Patients:  BloggerCourse.com  Fact Sheet for Healthcare Providers:  SeriousBroker.it  This test is no t yet approved or cleared by the [place] FDA and  has been authorized for detection and/or diagnosis of SARS-CoV-2 by FDA under an Emergency Use Authorization (EUA). This EUA will remain  in  effect (meaning this test can be used) for the duration of the COVID-19 declaration under Section 564(b)(1) of the Act, 21 U.S.C.section 360bbb-3(b)(1), unless the authorization is terminated  or revoked sooner.       Influenza A by PCR NEGATIVE NEGATIVE Final   Influenza B by PCR NEGATIVE NEGATIVE Final    Comment: (NOTE) The Xpert Xpress SARS-CoV-2/FLU/RSV plus assay is intended as an aid in the diagnosis of influenza from Nasopharyngeal swab specimens and should not be used as a sole basis for treatment. Nasal washings and aspirates are unacceptable for Xpert Xpress SARS-CoV-2/FLU/RSV testing.  Fact Sheet for Patients: BloggerCourse.com  Fact Sheet for Healthcare Providers: SeriousBroker.it  This test is not yet approved or cleared by the [place] FDA and has been authorized for detection and/or diagnosis of SARS-CoV-2 by FDA under an Emergency Use Authorization (EUA). This EUA will remain in effect (meaning this test can be used) for the duration of the COVID-19 declaration under Section 564(b)(1) of the Act, 21 U.S.C. section 360bbb-3(b)(1), unless the authorization is terminated or  revoked.     Resp Syncytial Virus by PCR NEGATIVE NEGATIVE Final    Comment: (NOTE) Fact Sheet for Patients: BloggerCourse.com  Fact Sheet for Healthcare Providers: SeriousBroker.it  This test is not yet approved or cleared by the [place] FDA and has been authorized for detection and/or diagnosis of SARS-CoV-2 by FDA under an Emergency Use Authorization (EUA). This EUA will remain in effect (meaning this test can be used) for the duration of the COVID-19 declaration under Section 564(b)(1) of the Act, 21 U.S.C. section 360bbb-3(b)(1), unless the authorization is terminated or revoked.  Performed at [facility], [address] [place], [place] [address]   Culture, blood  (Routine x 2)     Status: Abnormal   Collection Time: 04/02/22  2:01 PM   Specimen: BLOOD  Result Value Ref Range Status   Specimen Description BLOOD LEFT ANTECUBITAL  Final   Special Requests   Final    BOTTLES DRAWN AEROBIC AND ANAEROBIC Blood Culture adequate volume   Culture  Setup Time   Final    GRAM POSITIVE COCCI ANAEROBIC BOTTLE ONLY Gram Stain Report Called to,Read Back By and Verified With: WHITE M @ 0838 ON [id number] BY HENDERSON L GRAM POSITIVE COCCI AEROBIC BOTTLE ONLY CRITICAL RESULT CALLED TO, READ BACK BY AND VERIFIED WITH:  C/ M. WHITE, RN 04/03/22 1400 A. LAFRANCE Performed at [facility], [address] [place], [place] [address]    Culture (A)  Final    STAPHYLOCOCCUS EPIDERMIDIS STAPHYLOCOCCUS HOMINIS    Report Status 04/06/2022 FINAL  Final   Organism ID, Bacteria STAPHYLOCOCCUS EPIDERMIDIS  Final   Organism ID, Bacteria STAPHYLOCOCCUS HOMINIS  Final      Susceptibility   Staphylococcus epidermidis - MIC*    CIPROFLOXACIN <=0.5 SENSITIVE Sensitive     ERYTHROMYCIN >=8 RESISTANT Resistant     GENTAMICIN <=0.5 SENSITIVE Sensitive     OXACILLIN >=4 RESISTANT Resistant     TETRACYCLINE 2 SENSITIVE Sensitive     VANCOMYCIN 1 SENSITIVE Sensitive     TRIMETH/SULFA <=10 SENSITIVE Sensitive     CLINDAMYCIN <=0.25 SENSITIVE Sensitive     RIFAMPIN <=0.5 SENSITIVE Sensitive     Inducible Clindamycin NEGATIVE Sensitive     * STAPHYLOCOCCUS EPIDERMIDIS   Staphylococcus hominis - MIC*    CIPROFLOXACIN <=0.5 SENSITIVE Sensitive     ERYTHROMYCIN >=8 RESISTANT Resistant     GENTAMICIN <=0.5 SENSITIVE Sensitive     OXACILLIN >=4 RESISTANT Resistant     TETRACYCLINE >=16 RESISTANT Resistant     VANCOMYCIN <=0.5 SENSITIVE Sensitive     TRIMETH/SULFA <=10 SENSITIVE Sensitive     CLINDAMYCIN <=0.25 SENSITIVE Sensitive     RIFAMPIN <=0.5 SENSITIVE Sensitive     Inducible Clindamycin NEGATIVE Sensitive     * STAPHYLOCOCCUS HOMINIS  Culture, blood (Routine x 2)      Status: Abnormal   Collection Time: 04/02/22  2:01 PM   Specimen: BLOOD LEFT HAND  Result Value Ref Range Status   Specimen Description   Final    BLOOD LEFT HAND BOTTLES DRAWN AEROBIC ONLY Performed at [facility], [address], [place], [place] [address]    Special Requests   Final    Blood Culture adequate volume Performed at [facility], [address], [place], [place] [address]    Culture  Setup Time   Final    GRAM POSITIVE COCCI AEROBIC BOTTLE ONLY Gram Stain Report Called to,Read Back By and Verified With: WHITE M @ 1334 ON [id number] BY HENDERSON  L CRITICAL VALUE NOTED.  VALUE IS CONSISTENT WITH PREVIOUSLY REPORTED AND CALLED VALUE.    Culture (A)  Final    STAPHYLOCOCCUS EPIDERMIDIS STAPHYLOCOCCUS HOMINIS SUSCEPTIBILITIES PERFORMED ON PREVIOUS CULTURE WITHIN THE LAST 5 DAYS. Performed at [facility], [address] [place], [place] [address]    Report Status 04/06/2022 FINAL  Final  Blood Culture ID Panel (Reflexed)     Status: Abnormal   Collection Time: 04/02/22  2:01 PM  Result Value Ref Range Status   Enterococcus faecalis NOT DETECTED NOT DETECTED Final   Enterococcus Faecium NOT DETECTED NOT DETECTED Final   Listeria monocytogenes NOT DETECTED NOT DETECTED Final   Staphylococcus species DETECTED (A) NOT DETECTED Final    Comment: CRITICAL RESULT CALLED TO, READ BACK BY AND VERIFIED WITH:  C/ M. WHITE, RN 04/03/22 1400 A. LAFRANCE    Staphylococcus aureus (BCID) NOT DETECTED NOT DETECTED Final   Staphylococcus epidermidis DETECTED (A) NOT DETECTED Final    Comment: Methicillin (oxacillin) resistant coagulase negative staphylococcus. Possible blood culture contaminant (unless isolated from more than one blood culture draw or clinical case suggests pathogenicity). No antibiotic treatment is indicated for blood  culture contaminants. CRITICAL RESULT CALLED TO, READ BACK BY AND VERIFIED WITH:  C/ M. WHITE, RN 04/03/22 1400 A. LAFRANCE    Staphylococcus  lugdunensis NOT DETECTED NOT DETECTED Final   Streptococcus species NOT DETECTED NOT DETECTED Final   Streptococcus agalactiae NOT DETECTED NOT DETECTED Final   Streptococcus pneumoniae NOT DETECTED NOT DETECTED Final   Streptococcus pyogenes NOT DETECTED NOT DETECTED Final   A.calcoaceticus-baumannii NOT DETECTED NOT DETECTED Final   Bacteroides fragilis NOT DETECTED NOT DETECTED Final   Enterobacterales NOT DETECTED NOT DETECTED Final   Enterobacter cloacae complex NOT DETECTED NOT DETECTED Final   Escherichia coli NOT DETECTED NOT DETECTED Final   Klebsiella aerogenes NOT DETECTED NOT DETECTED Final   Klebsiella oxytoca NOT DETECTED NOT DETECTED Final   Klebsiella pneumoniae NOT DETECTED NOT DETECTED Final   Proteus species NOT DETECTED NOT DETECTED Final   Salmonella species NOT DETECTED NOT DETECTED Final   Serratia marcescens NOT DETECTED NOT DETECTED Final   Haemophilus influenzae NOT DETECTED NOT DETECTED Final   Neisseria meningitidis NOT DETECTED NOT DETECTED Final   Pseudomonas aeruginosa NOT DETECTED NOT DETECTED Final   Stenotrophomonas maltophilia NOT DETECTED NOT DETECTED Final   Candida albicans NOT DETECTED NOT DETECTED Final   Candida auris NOT DETECTED NOT DETECTED Final   Candida glabrata NOT DETECTED NOT DETECTED Final   Candida krusei NOT DETECTED NOT DETECTED Final   Candida parapsilosis NOT DETECTED NOT DETECTED Final   Candida tropicalis NOT DETECTED NOT DETECTED Final   Cryptococcus neoformans/gattii NOT DETECTED NOT DETECTED Final   Methicillin resistance mecA/C DETECTED (A) NOT DETECTED Final    Comment: CRITICAL RESULT CALLED TO, READ BACK BY AND VERIFIED WITH:  C/ M. WHITE, RN 04/03/22 1400 A. LAFRANCE Performed at [facility], [address], [place], [place] [address]   Urine Culture     Status: None   Collection Time: 04/02/22  3:33 PM   Specimen: Urine, Catheterized  Result Value Ref Range Status   Specimen Description   Final    URINE,  CATHETERIZED Performed at [facility], [address], [place], [place] [address]    Special Requests   Final    NONE Performed at [facility], [address], [place], [place] [address]    Culture   Final    NO GROWTH  Performed at [facility], [address] [place], [place] [address]    Report Status 04/04/2022 FINAL  Final  Blood culture (routine x 2)     Status: Abnormal   Collection Time: 04/03/22  4:23 PM   Specimen: BLOOD  Result Value Ref Range Status   Specimen Description BLOOD LEFT ANTECUBITAL  Final   Special Requests   Final    BOTTLES DRAWN AEROBIC AND ANAEROBIC Blood Culture adequate volume   Culture  Setup Time   Final    GRAM POSITIVE COCCI Gram Stain Report Called to,Read Back By and Verified With: T. Darcel Bayley  @ 0944 BY STEPHTR  04/04/22 IN BOTH AEROBIC AND ANAEROBIC BOTTLES CRITICAL VALUE NOTED.  VALUE IS CONSISTENT WITH PREVIOUSLY REPORTED AND CALLED VALUE. Performed at [facility], [address] [place], [place] [address]    Culture (A)  Final    STAPHYLOCOCCUS EPIDERMIDIS STAPHYLOCOCCUS HOMINIS STAPHYLOCOCCUS HOMINIS SUSCEPTIBILITIES PERFORMED ON PREVIOUS CULTURE WITHIN THE LAST 5 DAYS.    Report Status 04/07/2022 FINAL  Final   Organism ID, Bacteria STAPHYLOCOCCUS EPIDERMIDIS  Final      Susceptibility   Staphylococcus epidermidis - MIC*    CIPROFLOXACIN <=0.5 SENSITIVE Sensitive     ERYTHROMYCIN >=8 RESISTANT Resistant     GENTAMICIN <=0.5 SENSITIVE Sensitive     OXACILLIN >=4 RESISTANT Resistant     TETRACYCLINE 2 SENSITIVE Sensitive     VANCOMYCIN 2 SENSITIVE Sensitive     TRIMETH/SULFA <=10 SENSITIVE Sensitive     CLINDAMYCIN <=0.25 SENSITIVE Sensitive     RIFAMPIN <=0.5 SENSITIVE Sensitive     Inducible Clindamycin NEGATIVE Sensitive     * STAPHYLOCOCCUS EPIDERMIDIS  Blood culture (routine x 2)     Status: Abnormal   Collection Time: 04/03/22  4:28 PM   Specimen: BLOOD RIGHT HAND  Result Value Ref Range Status   Specimen  Description BLOOD RIGHT HAND  Final   Special Requests   Final    BOTTLES DRAWN AEROBIC AND ANAEROBIC Blood Culture adequate volume   Culture  Setup Time   Final    GRAM POSITIVE COCCI Gram Stain Report Called to,Read Back By and Verified With:  T. LEONARD @ 0944 BY STEPHTR 04/04/22 IN BOTH AEROBIC AND ANAEROBIC BOTTLES Organism ID to follow CRITICAL RESULT CALLED TO, READ BACK BY AND VERIFIED WITH:  Perlie Gold, RN 04/04/22 1552 A. LAFRANCE Performed at [facility], [address], [place], [place] [address]    Culture (A)  Final    STAPHYLOCOCCUS EPIDERMIDIS STAPHYLOCOCCUS HOMINIS STAPHYLOCOCCUS HOMINIS SUSCEPTIBILITIES PERFORMED ON PREVIOUS CULTURE WITHIN THE LAST 5 DAYS.    Report Status 04/07/2022 FINAL  Final   Organism ID, Bacteria STAPHYLOCOCCUS EPIDERMIDIS  Final      Susceptibility   Staphylococcus epidermidis - MIC*    CIPROFLOXACIN <=0.5 SENSITIVE Sensitive     ERYTHROMYCIN >=8 RESISTANT Resistant     GENTAMICIN <=0.5 SENSITIVE Sensitive     OXACILLIN >=4 RESISTANT Resistant     TETRACYCLINE 2 SENSITIVE Sensitive     VANCOMYCIN 2 SENSITIVE Sensitive     TRIMETH/SULFA <=10 SENSITIVE Sensitive     CLINDAMYCIN <=0.25 SENSITIVE Sensitive     RIFAMPIN <=0.5 SENSITIVE Sensitive     Inducible Clindamycin NEGATIVE Sensitive     * STAPHYLOCOCCUS EPIDERMIDIS  Blood Culture ID Panel (Reflexed)     Status: Abnormal   Collection Time: 04/03/22  4:28 PM  Result Value Ref Range Status   Enterococcus faecalis NOT DETECTED NOT DETECTED  Final   Enterococcus Faecium NOT DETECTED NOT DETECTED Final   Listeria monocytogenes NOT DETECTED NOT DETECTED Final   Staphylococcus species DETECTED (A) NOT DETECTED Final    Comment: CRITICAL RESULT CALLED TO, READ BACK BY AND VERIFIED WITH:  Perlie Gold, RN 04/04/22 1552 A. LAFRANCE    Staphylococcus aureus (BCID) NOT DETECTED NOT DETECTED Final   Staphylococcus epidermidis DETECTED (A) NOT DETECTED Final    Comment: Methicillin  (oxacillin) resistant coagulase negative staphylococcus. Possible blood culture contaminant (unless isolated from more than one blood culture draw or clinical case suggests pathogenicity). No antibiotic treatment is indicated for blood  culture contaminants. CRITICAL RESULT CALLED TO, READ BACK BY AND VERIFIED WITH:  Perlie Gold, RN 04/04/22 1552 A. LAFRANCE    Staphylococcus lugdunensis NOT DETECTED NOT DETECTED Final   Streptococcus species NOT DETECTED NOT DETECTED Final   Streptococcus agalactiae NOT DETECTED NOT DETECTED Final   Streptococcus pneumoniae NOT DETECTED NOT DETECTED Final   Streptococcus pyogenes NOT DETECTED NOT DETECTED Final   A.calcoaceticus-baumannii NOT DETECTED NOT DETECTED Final   Bacteroides fragilis NOT DETECTED NOT DETECTED Final   Enterobacterales NOT DETECTED NOT DETECTED Final   Enterobacter cloacae complex NOT DETECTED NOT DETECTED Final   Escherichia coli NOT DETECTED NOT DETECTED Final   Klebsiella aerogenes NOT DETECTED NOT DETECTED Final   Klebsiella oxytoca NOT DETECTED NOT DETECTED Final   Klebsiella pneumoniae NOT DETECTED NOT DETECTED Final   Proteus species NOT DETECTED NOT DETECTED Final   Salmonella species NOT DETECTED NOT DETECTED Final   Serratia marcescens NOT DETECTED NOT DETECTED Final   Haemophilus influenzae NOT DETECTED NOT DETECTED Final   Neisseria meningitidis NOT DETECTED NOT DETECTED Final   Pseudomonas aeruginosa NOT DETECTED NOT DETECTED Final   Stenotrophomonas maltophilia NOT DETECTED NOT DETECTED Final   Candida albicans NOT DETECTED NOT DETECTED Final   Candida auris NOT DETECTED NOT DETECTED Final   Candida glabrata NOT DETECTED NOT DETECTED Final   Candida krusei NOT DETECTED NOT DETECTED Final   Candida parapsilosis NOT DETECTED NOT DETECTED Final   Candida tropicalis NOT DETECTED NOT DETECTED Final   Cryptococcus neoformans/gattii NOT DETECTED NOT DETECTED Final   Methicillin resistance mecA/C DETECTED (A) NOT  DETECTED Final    Comment: CRITICAL RESULT CALLED TO, READ BACK BY AND VERIFIED WITH:  Perlie Gold, RN 04/04/22 1552 A. LAFRANCE Performed at [facility], [address] [place], [place] [address]   Culture, blood (Routine X 2) w Reflex to ID Panel     Status: None (Preliminary result)   Collection Time: 04/04/22 10:20 AM   Specimen: BLOOD RIGHT ARM  Result Value Ref Range Status   Specimen Description   Final    BLOOD RIGHT ARM BOTTLES DRAWN AEROBIC AND ANAEROBIC   Special Requests   Final    Blood Culture results may not be optimal due to an excessive volume of blood received in culture bottles   Culture  Setup Time   Final    GRAM POSITIVE COCCI ANAEROBIC BOTTLE ONLY Gram Stain Report Called to,Read Back By and Verified With: BASS V. AT 1513 ON [id number] BY THOMPSON S. Performed at [facility], [address], [place], [place] [address]    Culture PENDING  Incomplete   Report Status PENDING  Incomplete  Culture, blood (Routine X 2) w Reflex to ID Panel     Status: None (Preliminary result)   Collection Time: 04/04/22 10:20 AM   Specimen: BLOOD RIGHT HAND  Result  Value Ref Range Status   Specimen Description   Final    BLOOD RIGHT HAND BOTTLES DRAWN AEROBIC AND ANAEROBIC   Special Requests   Final    Blood Culture results may not be optimal due to an excessive volume of blood received in culture bottles   Culture   Final    NO GROWTH 3 DAYS Performed at [facility], [address], [place], [place] [address]    Report Status PENDING  Incomplete  Gastrointestinal Panel by PCR , Stool     Status: None   Collection Time: 04/04/22  2:24 PM  Result Value Ref Range Status   Campylobacter species NOT DETECTED NOT DETECTED Final   Plesimonas shigelloides NOT DETECTED NOT DETECTED Final   Salmonella species NOT DETECTED NOT DETECTED Final   Yersinia enterocolitica NOT DETECTED NOT DETECTED Final   Vibrio species NOT DETECTED NOT DETECTED Final   Vibrio cholerae NOT DETECTED  NOT DETECTED Final   Enteroaggregative E coli (EAEC) NOT DETECTED NOT DETECTED Final   Enteropathogenic E coli (EPEC) NOT DETECTED NOT DETECTED Final   Enterotoxigenic E coli (ETEC) NOT DETECTED NOT DETECTED Final   Shiga like toxin producing E coli (STEC) NOT DETECTED NOT DETECTED Final   Shigella/Enteroinvasive E coli (EIEC) NOT DETECTED NOT DETECTED Final   Cryptosporidium NOT DETECTED NOT DETECTED Final   Cyclospora cayetanensis NOT DETECTED NOT DETECTED Final   Entamoeba histolytica NOT DETECTED NOT DETECTED Final   Giardia lamblia NOT DETECTED NOT DETECTED Final   Adenovirus F40/41 NOT DETECTED NOT DETECTED Final   Astrovirus NOT DETECTED NOT DETECTED Final   Norovirus GI/GII NOT DETECTED NOT DETECTED Final   Rotavirus A NOT DETECTED NOT DETECTED Final   Sapovirus (I, II, IV, and V) NOT DETECTED NOT DETECTED Final    Comment: Performed at [facility], [address] [place], [place] [address]  Culture, blood (single) w Reflex to ID Panel     Status: Abnormal (Preliminary result)   Collection Time: 04/05/22  4:30 PM   Specimen: Right Antecubital; Blood  Result Value Ref Range Status   Specimen Description   Final    RIGHT ANTECUBITAL Performed at [facility], [address] [place], [place] [address]    Special Requests   Final    BOTTLES DRAWN AEROBIC AND ANAEROBIC Blood Culture adequate volume Performed at [facility], [address] [place], [place] [address]    Culture  Setup Time   Final    GRAM POSITIVE COCCI Gram Stain Report Called to,Read Back By and Verified With: [email or web address]  BY MATTHEWS, B 1.16.2024 IN BOTH AEROBIC AND ANAEROBIC BOTTLES CRITICAL VALUE NOTED.  VALUE IS CONSISTENT WITH PREVIOUSLY REPORTED AND CALLED VALUE.    Culture (A)  Final    STAPHYLOCOCCUS EPIDERMIDIS SUSCEPTIBILITIES PERFORMED ON PREVIOUS CULTURE WITHIN THE LAST 5 DAYS. Performed at [facility], [address], [place], [place] [address]    Report Status PENDING   Incomplete     Scheduled Meds:  acidophilus  1 capsule Oral Daily   atorvastatin  20 mg Oral Daily   Chlorhexidine Gluconate Cloth  6 each Topical Daily   cholestyramine light  4 g Oral Daily   dicyclomine  10 mg Oral TID AC & HS   diphenoxylate-atropine  1 tablet Oral QID   escitalopram  20 mg Oral Daily   gabapentin  100 mg Oral QHS   insulin aspart  0-9 Units Subcutaneous Q8H   lipase/protease/amylase  108,000 Units Oral TID WC  megestrol  400 mg Oral BID   mirtazapine  30 mg Oral QHS   pantoprazole  40 mg Oral BID AC   Continuous Infusions:  TPN ADULT (ION) 65 mL/hr at 04/07/22 0600   TPN ADULT (ION)     vancomycin Stopped (04/06/22 2003)    Procedures/Studies: ECHOCARDIOGRAM COMPLETE  Result Date: 04/04/2022    ECHOCARDIOGRAM REPORT   Patient Name:   EZEKIAL ARNS Date of Exam: 04/04/2022 Medical Rec #:  [id number]        Height:       68.0 in Accession #:    [id number]       Weight:       120.0 lb Date of Birth:  1950/06/16       BSA:          1.645 m Patient Age:    71 years         BP:           95/46 mmHg Patient Gender: M                HR:           91 bpm. Exam Location:  Inpatient Procedure: 2D Echo, Cardiac Doppler and Color Doppler Indications:    Bacteremia  History:        Patient has prior history of Echocardiogram examinations, most                 recent 03/20/2016. Signs/Symptoms:Fever; Risk                 Factors:Hypertension. Cancer. Pulmonary embolus 2012.  Sonographer:    Sheralyn Boatman RDCS Referring Phys: [phone] DAVID TAT IMPRESSIONS  1. Left ventricular ejection fraction, by estimation, is 60 to 65%. Left ventricular ejection fraction by 2D MOD biplane is 60.6 %. The left ventricle has normal function. The left ventricle has no regional wall motion abnormalities. Left ventricular diastolic parameters are consistent with Grade I diastolic dysfunction (impaired relaxation).  2. Right ventricular systolic function is normal. The right ventricular size is normal. There  is mildly elevated pulmonary artery systolic pressure. The estimated right ventricular systolic pressure is 43.2 mmHg.  3. The mitral valve is grossly normal. Trivial mitral valve regurgitation.  4. The aortic valve is tricuspid. Aortic valve regurgitation is not visualized.  5. The inferior vena cava is normal in size with greater than 50% respiratory variability, suggesting right atrial pressure of 3 mmHg. Conclusion(s)/Recommendation(s): No evidence of valvular vegetations on this transthoracic echocardiogram. Consider a transesophageal echocardiogram to exclude infective endocarditis if clinically indicated. FINDINGS  Left Ventricle: Left ventricular ejection fraction, by estimation, is 60 to 65%. Left ventricular ejection fraction by 2D MOD biplane is 60.6 %. The left ventricle has normal function. The left ventricle has no regional wall motion abnormalities. The left ventricular internal cavity size was normal in size. There is no left ventricular hypertrophy. Left ventricular diastolic parameters are consistent with Grade I diastolic dysfunction (impaired relaxation). Indeterminate filling pressures. Right Ventricle: The right ventricular size is normal. No increase in right ventricular wall thickness. Right ventricular systolic function is normal. There is mildly elevated pulmonary artery systolic pressure. The tricuspid regurgitant velocity is 3.17  m/s, and with an assumed right atrial pressure of 3 mmHg, the estimated right ventricular systolic pressure is 43.2 mmHg. Left Atrium: Left atrial size was normal in size. Right Atrium: Right atrial size was normal in size. Pericardium: There is no evidence of pericardial effusion. Mitral Valve: The mitral  valve is grossly normal. Trivial mitral valve regurgitation. Tricuspid Valve: The tricuspid valve is grossly normal. Tricuspid valve regurgitation is mild. Aortic Valve: The aortic valve is tricuspid. Aortic valve regurgitation is not visualized. Pulmonic  Valve: The pulmonic valve was normal in structure. Pulmonic valve regurgitation is not visualized. Aorta: The aortic root and ascending aorta are structurally normal, with no evidence of dilitation. Venous: The inferior vena cava is normal in size with greater than 50% respiratory variability, suggesting right atrial pressure of 3 mmHg. IAS/Shunts: No atrial level shunt detected by color flow Doppler.  LEFT VENTRICLE PLAX 2D                        Biplane EF (MOD) LVIDd:         3.80 cm         LV Biplane EF:   Left LVIDs:         2.40 cm                          ventricular LV PW:         1.10 cm                          ejection LV IVS:        0.80 cm                          fraction by LVOT diam:     2.30 cm                          2D MOD LV SV:         90                               biplane is LV SV Index:   55                               60.6 %. LVOT Area:     4.15 cm                                Diastology                                LV e' medial:    8.70 cm/s LV Volumes (MOD)               LV E/e' medial:  13.4 LV vol d, MOD    96.3 ml       LV e' lateral:   11.90 cm/s A2C:                           LV E/e' lateral: 9.8 LV vol d, MOD    90.4 ml A4C: LV vol s, MOD    41.7 ml A2C: LV vol s, MOD    38.0 ml A4C: LV SV MOD A2C:   54.6 ml LV SV MOD A4C:   90.4 ml LV SV MOD BP:    62.1 ml RIGHT VENTRICLE  IVC RV S prime:     16.00 cm/s  IVC diam: 1.80 cm TAPSE (M-mode): 1.8 cm LEFT ATRIUM             Index        RIGHT ATRIUM           Index LA diam:        3.60 cm 2.19 cm/m   RA Area:     13.50 cm LA Vol (A2C):   69.4 ml 42.19 ml/m  RA Volume:   36.00 ml  21.89 ml/m LA Vol (A4C):   27.6 ml 16.78 ml/m LA Biplane Vol: 46.0 ml 27.97 ml/m  AORTIC VALVE LVOT Vmax:   135.00 cm/s LVOT Vmean:  90.700 cm/s LVOT VTI:    0.216 m  AORTA Ao Root diam: 3.10 cm Ao Asc diam:  3.60 cm MITRAL VALVE                TRICUSPID VALVE MV Area (PHT): 4.15 cm     TR Peak grad:   40.2 mmHg MV Decel Time: 183  msec     TR Vmax:        317.00 cm/s MV E velocity: 116.67 cm/s MV A velocity: 96.57 cm/s   SHUNTS MV E/A ratio:  1.21         Systemic VTI:  0.22 m                             Systemic Diam: 2.30 cm Zoila Shutter MD Electronically signed by Zoila Shutter MD Signature Date/Time: 04/04/2022/5:27:58 PM    Final    DG Chest Port 1 View  Result Date: 04/02/2022 CLINICAL DATA:  Possible sepsis EXAM: PORTABLE CHEST 1 VIEW COMPARISON:  01/08/2022 FINDINGS: Cardiac size is within normal limits. There is decreased volume in the left lung. Linear densities are seen in left mid and left lower lung fields. Surgical staples are seen in left parahilar region. Left hemidiaphragm is elevated. There is blunting of left lateral CP angle. There is no pneumothorax. Tip of left subclavian chest port is seen in superior vena cava. IMPRESSION: Postsurgical changes are noted in left hemithorax. There are no new infiltrates or signs of pulmonary edema. Electronically Signed   By: Ernie Avena M.D.   On: 04/02/2022 13:21    Vassie Loll, MD [facility]  If 7PM-7AM, please contact night-coverage [email or web address] Password [email or web address] 04/07/2022, 4:54 PM   LOS: 4 days

## 2022-04-07 NOTE — Progress Notes (Signed)
PHARMACY - TOTAL PARENTERAL NUTRITION CONSULT NOTE   Indication:  decreased nutrition  started 03/19/22  Patient Measurements: Height: 5\' 8"  (172.7 cm) Weight: 54.4 kg (120 lb) IBW/kg (Calculated) : 68.4 TPN AdjBW (KG): 54.4 Body mass index is 18.25 kg/m.   Assessment:  Patient started on TPN 03/19/22. 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 Minimal intake orally. Pharmacy asked to continue TPN. (obtained patients TPN formula from [facility]=>adjusted as needed per patient labs. )  Patient with CONS bacteremia.   Glucose / Insulin:  96-135 1 unit Electrolytes: K 4.2  corrected calcium 9.0 Mag 1.7 > 2.2 Phos 2.1> 3.1 Renal: WNL Hepatic: Albumin 1.6 Intake / Output; MIVF:  patient   Central access: implanted port  TPN start date: 03/19/22   Nutritional Goals: Increase nutrition for surgery  RD Assessment: Estimated Needs Total Energy Estimated Needs: 1900-2100 kcal Total Protein Estimated Needs: 95-115 grams Total Fluid Estimated Needs: >/= 2.3 L/daypending  Current Nutrition:  Current TPN provides: CHO 234 kcal, Prot 78g, Lipids 390Kcal. Total Kcal 1497 Kcal/day  Plan:  Continue TPN at 65 mL/hr at 1800-  Electrolytes in TPN: Na 16mEq/L, K 5mEq/L, Ca 47mEq/L, Mg 91mEq/L, and Phos 6mmol/L. Maximize acetate Add standard MVI and trace elements to TPN Initiate Sensitive q8h SSI and adjust as needed  >Monitor TPN labs on Mon/Thurs,   12m, BS Pharm D, BCPS Clinical Pharmacist 04/07/2022 7:57 AM

## 2022-04-07 NOTE — Anesthesia Postprocedure Evaluation (Signed)
Anesthesia Post Note  Patient: [name]  Procedure(s) Performed: ESOPHAGOGASTRODUODENOSCOPY (EGD) WITH PROPOFOL ENTEROSCOPY SUBMUCOSAL TATTOO INJECTION SUBMUCOSAL INJECTION HOT HEMOSTASIS (ARGON PLASMA COAGULATION/BICAP)  Patient location during evaluation: Phase II Anesthesia Type: General Level of consciousness: awake Pain management: pain level controlled Vital Signs Assessment: post-procedure vital signs reviewed and stable Respiratory status: spontaneous breathing and respiratory function stable Cardiovascular status: blood pressure returned to baseline and stable Postop Assessment: no headache and no apparent nausea or vomiting Anesthetic complications: no Comments: Late entry   No notable events documented.   Last Vitals:  Vitals:   04/07/22 1500 04/07/22 1534  BP: 132/67 133/81  Pulse: 72 70  Resp: 15 20  Temp:  36.9 C  SpO2: 97% 99%    Last Pain:  Vitals:   04/07/22 1534  TempSrc: Oral  PainSc:                  Windell Norfolk

## 2022-04-07 NOTE — TOC Progression Note (Addendum)
Transition of Care [facility]) - Progression Note    Patient Details  Name: [name] MRN: [id number] Date of Birth: 1950/12/27  Transition of Care [facility] CM/SW Contact  Annice Needy, LCSW Phone Number: 04/07/2022, 11:56 AM  Clinical Narrative:    Contact made with Jeri Modena with [facility] regarding IV abx. Patient is active with [facility]. [facility] is his Advanced Pain [facility] provider.   Expected Discharge Plan: Home w Home Health Services Barriers to Discharge: Continued Medical Work up  Expected Discharge Plan and Services In-house Referral: Clinical Social Work   Post Acute Care Choice: Resumption of Svcs/PTA Provider Living arrangements for the past 2 months: Skilled Nursing Facility                           HH Arranged: RN [facility] Agency: [facility] Date [facility] Agency Contacted: 04/04/22 Time HH Agency Contacted: 1151 Representative spoke with at [facility] Agency: Clydie Braun   Social Determinants of Health (SDOH) Interventions SDOH Screenings   Food Insecurity: No Food Insecurity (04/03/2022)  Housing: Low Risk  (04/03/2022)  Transportation Needs: No Transportation Needs (04/03/2022)  Utilities: Not At Risk (04/03/2022)  Tobacco Use: Medium Risk (04/03/2022)    Readmission Risk Interventions    04/04/2022   11:49 AM  Readmission Risk Prevention Plan  Transportation Screening Complete  HRI or Home Care Consult Complete  Social Work Consult for Recovery Care Planning/Counseling Complete  Palliative Care Screening Not Applicable  Medication Review Oceanographer) Complete

## 2022-04-07 NOTE — Progress Notes (Addendum)
Cardiology consulted for TEE in setting of bacteremia. Patient is for EGD today and from surgery note likely port removal tomorrow. We will check back when completed and schedule TEE when both prior procedures are complete, looks likely to be Friday for TEE.    Dina Rich MD

## 2022-04-07 NOTE — Anesthesia Preprocedure Evaluation (Addendum)
Anesthesia Evaluation  Patient identified by MRN, date of birth, ID band Patient awake    Reviewed: Allergy & Precautions, H&P , NPO status , Patient's Chart, lab work & pertinent test results, reviewed documented beta blocker date and time   Airway Mallampati: II  TM Distance: >3 FB Neck ROM: full    Dental no notable dental hx.    Pulmonary shortness of breath, Recent URI , former smoker   Pulmonary exam normal breath sounds clear to auscultation       Cardiovascular Exercise Tolerance: Good hypertension,  Rhythm:regular Rate:Normal     Neuro/Psych Seizures -,  PSYCHIATRIC DISORDERS Anxiety     TIA   GI/Hepatic Neg liver ROS, PUD,GERD  Medicated,,  Endo/Other  negative endocrine ROS    Renal/GU CRF and ARFRenal disease  negative genitourinary   Musculoskeletal   Abdominal   Peds  Hematology  (+) Blood dyscrasia, anemia   Anesthesia Other Findings   Reproductive/Obstetrics negative OB ROS                             Anesthesia Physical Anesthesia Plan  ASA: 4 and emergent  Anesthesia Plan: General   Post-op Pain Management:    Induction:   PONV Risk Score and Plan: Propofol infusion  Airway Management Planned:   Additional Equipment:   Intra-op Plan:   Post-operative Plan:   Informed Consent: I have reviewed the patients History and Physical, chart, labs and discussed the procedure including the risks, benefits and alternatives for the proposed anesthesia with the patient or authorized representative who has indicated his/her understanding and acceptance.     Dental Advisory Given  Plan Discussed with: CRNA  Anesthesia Plan Comments:         Anesthesia Quick Evaluation

## 2022-04-08 ENCOUNTER — Inpatient Hospital Stay (HOSPITAL_COMMUNITY): Payer: Medicare Other

## 2022-04-08 ENCOUNTER — Telehealth: Payer: Self-pay | Admitting: Gastroenterology

## 2022-04-08 ENCOUNTER — Encounter (HOSPITAL_COMMUNITY)
Admission: EM | Disposition: A | Discharge status: Home / Self Care | Payer: Self-pay | Source: Home / Self Care | Attending: Internal Medicine

## 2022-04-08 ENCOUNTER — Encounter: Payer: Self-pay | Admitting: Hematology

## 2022-04-08 ENCOUNTER — Encounter: Payer: Self-pay | Admitting: Internal Medicine

## 2022-04-08 DIAGNOSIS — C252 Malignant neoplasm of tail of pancreas: Secondary | ICD-10-CM | POA: Diagnosis not present

## 2022-04-08 DIAGNOSIS — B957 Other staphylococcus as the cause of diseases classified elsewhere: Secondary | ICD-10-CM | POA: Diagnosis not present

## 2022-04-08 DIAGNOSIS — N1831 Chronic kidney disease, stage 3a: Secondary | ICD-10-CM | POA: Diagnosis not present

## 2022-04-08 DIAGNOSIS — R7881 Bacteremia: Secondary | ICD-10-CM | POA: Diagnosis not present

## 2022-04-08 DIAGNOSIS — I10 Essential (primary) hypertension: Secondary | ICD-10-CM | POA: Diagnosis not present

## 2022-04-08 HISTORY — PX: PORT-A-CATH REMOVAL: SHX5289

## 2022-04-08 LAB — GLUCOSE, CAPILLARY
Glucose-Capillary: 104 mg/dL — ABNORMAL HIGH (ref 70–99)
Glucose-Capillary: 106 mg/dL — ABNORMAL HIGH (ref 70–99)
Glucose-Capillary: 107 mg/dL — ABNORMAL HIGH (ref 70–99)
Glucose-Capillary: 108 mg/dL — ABNORMAL HIGH (ref 70–99)
Glucose-Capillary: 112 mg/dL — ABNORMAL HIGH (ref 70–99)
Glucose-Capillary: 123 mg/dL — ABNORMAL HIGH (ref 70–99)
Glucose-Capillary: 92 mg/dL (ref 70–99)
Glucose-Capillary: 94 mg/dL (ref 70–99)

## 2022-04-08 LAB — COMPREHENSIVE METABOLIC PANEL
ALT: 49 U/L — ABNORMAL HIGH (ref 0–44)
AST: 55 U/L — ABNORMAL HIGH (ref 15–41)
Albumin: 1.5 g/dL — ABNORMAL LOW (ref 3.5–5.0)
Alkaline Phosphatase: 87 U/L (ref 38–126)
Anion gap: 5 (ref 5–15)
BUN: 14 mg/dL (ref 8–23)
CO2: 23 mmol/L (ref 22–32)
Calcium: 7.7 mg/dL — ABNORMAL LOW (ref 8.9–10.3)
Chloride: 108 mmol/L (ref 98–111)
Creatinine, Ser: 0.88 mg/dL (ref 0.61–1.24)
GFR, Estimated: >60 mL/min (ref >60)
Glucose, Bld: 116 mg/dL — ABNORMAL HIGH (ref 70–99)
Potassium: 4.6 mmol/L (ref 3.5–5.1)
Sodium: 136 mmol/L (ref 135–145)
Total Bilirubin: 0.3 mg/dL (ref 0.3–1.2)
Total Protein: 4.8 g/dL — ABNORMAL LOW (ref 6.5–8.1)

## 2022-04-08 LAB — TRIGLYCERIDES: Triglycerides: 65 mg/dL (ref <150)

## 2022-04-08 LAB — VANCOMYCIN, RANDOM: Vancomycin Rm: 11 ug/mL

## 2022-04-08 LAB — PHOSPHORUS: Phosphorus: 4.4 mg/dL (ref 2.5–4.6)

## 2022-04-08 LAB — CULTURE, BLOOD (SINGLE): Special Requests: ADEQUATE

## 2022-04-08 LAB — MAGNESIUM: Magnesium: 1.7 mg/dL (ref 1.7–2.4)

## 2022-04-08 SURGERY — MINOR REMOVAL PORT-A-CATH
Anesthesia: LOCAL | Laterality: Left

## 2022-04-08 MED ORDER — VANCOMYCIN HCL 1250 MG/250ML IV SOLN
1250.0000 mg | INTRAVENOUS | Status: DC
  Administered 2022-04-08 – 2022-04-09 (×3): 1250 mg via INTRAVENOUS
  Filled 2022-04-08 (×2): qty 250

## 2022-04-08 MED ORDER — LIDOCAINE-EPINEPHRINE (PF) 1 %-1:200000 IJ SOLN
INTRAMUSCULAR | Status: DC | PRN
  Administered 2022-04-08: 4 mg

## 2022-04-08 MED ORDER — POVIDONE-IODINE 10 % EX OINT
TOPICAL_OINTMENT | CUTANEOUS | Status: AC
  Filled 2022-04-08: qty 1

## 2022-04-08 MED ORDER — LIDOCAINE HCL (PF) 1 % IJ SOLN
INTRAMUSCULAR | Status: AC
  Filled 2022-04-08: qty 30

## 2022-04-08 MED ORDER — BACITRACIN ZINC 500 UNIT/GM EX OINT
TOPICAL_OINTMENT | Freq: Every day | CUTANEOUS | Status: DC
  Filled 2022-04-08: qty 0.9

## 2022-04-08 MED ORDER — KCL IN DEXTROSE-NACL 10-5-0.45 MEQ/L-%-% IV SOLN
INTRAVENOUS | Status: AC
  Filled 2022-04-08 (×3): qty 1000

## 2022-04-08 SURGICAL SUPPLY — 13 items
APPLICATOR CHLORAPREP 10.5 ORG (MISCELLANEOUS) IMPLANT
CLOTH BEACON ORANGE TIMEOUT ST (SAFETY) ×1 IMPLANT
DECANTER SPIKE VIAL GLASS SM (MISCELLANEOUS) ×1 IMPLANT
DERMABOND ADVANCED .7 DNX12 (GAUZE/BANDAGES/DRESSINGS) ×1 IMPLANT
DRAPE HALF SHEET 40X57 (DRAPES) IMPLANT
GLOVE BIOGEL PI IND STRL 6.5 (GLOVE) ×1 IMPLANT
GLOVE BIOGEL PI IND STRL 7.0 (GLOVE) ×2 IMPLANT
GLOVE SURG SS PI 6.5 STRL IVOR (GLOVE) ×1 IMPLANT
GOWN STRL REUS W/TWL LRG LVL3 (GOWN DISPOSABLE) IMPLANT
SPONGE GAUZE 2X2 8PLY STRL LF (GAUZE/BANDAGES/DRESSINGS) ×1 IMPLANT
SUT MNCRL AB 4-0 PS2 18 (SUTURE) ×1 IMPLANT
SUT VIC AB 3-0 SH 27 (SUTURE) ×1
SUT VIC AB 3-0 SH 27X BRD (SUTURE) ×1 IMPLANT

## 2022-04-08 NOTE — Progress Notes (Signed)
[facility]  Patient requested that I not call anyone regarding port removal.  He tolerated procedure without issue.  I explained that he was dissolvable stitches under the skin.  He should apply antibiotic ointment to the incision sites daily.  He will be transferred back to the floor.  I will call him in 1 week to verify he has no issues with the incision site.  Plan: -Transfer back to floor -Regular diet -Wound care ordered for incision site -Antibiotics per primary team/ID -Catheter tip sent for culture -Care per primary team  Theophilus Kinds, DO [facility] [address] [place], [place] [address] [phone] (office)

## 2022-04-08 NOTE — Progress Notes (Signed)
PHARMACY - TOTAL PARENTERAL NUTRITION CONSULT NOTE   Indication:  decreased nutrition  started 03/19/22  Patient Measurements: Height: 5\' 8"  (172.7 cm) Weight: 54.4 kg (119 lb 14.9 oz) IBW/kg (Calculated) : 68.4 TPN AdjBW (KG): 54.4 Body mass index is 18.24 kg/m.   Assessment:  Patient started on TPN 03/19/22. 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 Minimal intake orally. Pharmacy asked to continue TPN. (obtained patients TPN formula from [facility]=>adjusted as needed per patient labs. )  Patient with CONS bacteremia.   Glucose / Insulin:  96-135 1 unit Electrolytes: K 4.2  corrected calcium 9.0 Mag 1.7 > 2.2 Phos 2.1> 3.1 Renal: WNL Hepatic: Albumin 1.6 Intake / Output; MIVF:  patient   Central access: implanted port  TPN start date: 03/19/22   Nutritional Goals: Increase nutrition for surgery  RD Assessment: Estimated Needs Total Energy Estimated Needs: 1900-2100 kcal Total Protein Estimated Needs: 95-115 grams Total Fluid Estimated Needs: >/= 2.3 L/daypending  Current Nutrition:  Current TPN provides: CHO 234 kcal, Prot 78g, Lipids 390Kcal. Total Kcal 1497 Kcal/day  Plan:  Port removed 1/18. TPN to held until new access can be established.  MD added D51/2NS @ 75 mL/hr.  2/18, PharmD Clinical Pharmacist 04/08/2022 10:49 AM

## 2022-04-08 NOTE — Progress Notes (Addendum)
Pharmacy Antibiotic Note  [name] is a 71 y.o. male admitted on 04/03/2022 with bacteremia.  Patient has Bcx positive from 04/02/22 growing 3/4 MRSE. Patient on TPN prior to admission. Pharmacy has been consulted for vancomycin dosing.    Plan: Vanco peak 21,  vanco random 11 Increase to vancomycin 1250 mg IV every 24 hours. Expected AUC 514 Monitor labs, c/s, and vanco levels as indicated.   Height: 5\' 8"  (172.7 cm) Weight: 54.4 kg (119 lb 14.9 oz) IBW/kg (Calculated) : 68.4  Temp (24hrs), Avg:98.6 F (37 C), Min:98 F (36.7 C), Max:98.9 F (37.2 C)  Recent Labs  Lab 04/02/22 1401 04/03/22 1623 04/03/22 1710 04/03/22 1814 04/04/22 0456 04/05/22 0524 04/06/22 0524 04/07/22 0402 04/07/22 2107 04/08/22 0357 04/08/22 0751  WBC 3.5* 4.2  --   --  4.4 5.5 7.9 7.8  --   --   --   CREATININE 1.27* 1.09  --   --  1.01 0.98 1.03 0.85  --  0.88  --   LATICACIDVEN 1.5  --  0.9 1.6  --   --   --   --   --   --   --   VANCOPEAK  --   --   --   --   --   --   --   --  21*  --   --   VANCORANDOM  --   --   --   --   --   --   --   --   --   --  11     Estimated Creatinine Clearance: 59.2 mL/min (by C-G formula based on SCr of 0.88 mg/dL).    No Known Allergies  Antimicrobials this admission: Vancomycin 1/13>   Dose adjustments this admission:   Microbiology results: 1/12 BCx: MRSE  1/14 Bcx: MRSE  Thank you for allowing pharmacy to be a part of this patient's care.  2/14, PharmD Clinical Pharmacist 04/08/2022 10:59 AM

## 2022-04-08 NOTE — Telephone Encounter (Signed)
Please arrange for hospital follow up with Dr. Jena Gauss only in 3-4 weeks.

## 2022-04-08 NOTE — Op Note (Signed)
[facility] Procedure Note   04/08/22   Pre-procedure Diagnosis: Port in place, bacteremia    Post-procedure Diagnosis: Same   Procedure(s) Performed: Port a catheter removal    Surgeon: Theophilus Kinds, DO   Assistants: Algis Greenhouse, MD   Anesthesia: Lidocaine 1%    Specimens:  None    Estimated Blood Loss: Minimal   Wound Class: Dirty, Infected bacteremia    Procedure Indications: [name] is a 72 y.o. with a port in place for colon cancer, lung cancer, and pancreatic cancer. He needs their port removed. We discussed removal and risk of bleeding, infection, incomplete removal, pneumothorax. He opted to proceed.    Findings: Normal appearing port removed completely, cavity hemostatic    Procedure: The patient was taken to the procedure room and placed supine. The left chest and neck were prepared and draped in the usual sterile fashion. Lidocaine 1% was injected at the port incision site and into the cavity around the port.     An incision was made. Using sharp and blunt dissection, the port was removed.  The catheter was scarred in, which required counter incision just below the clavicle.  After continued blunt and sharp dissection, the catheter slid out easily. My assistant [name] pressure for 10 minutes at the internal jugular entry site. Dr. Henreitta Leber was present and assisted with dissection. The skin was closed with interrupted 3-0 Vicryl.  Benzoine ointment and gauze were placed.    Final inspection revealed acceptable hemostasis. The patient tolerated the procedure well.    CXR was ordered to confirm complete removal   Theophilus Kinds, DO [facility] [address] [place] [place], [place] [address] [phone] (office)

## 2022-04-08 NOTE — Progress Notes (Signed)
PROGRESS NOTE  [name] LJQ:[id number] DOB: [birth year] DOA: 04/03/2022 PCP: Romeo Rabon, MD  Brief History:  72 year old male with a history of pancreatic adenocarcinoma, colon adenocarcinoma status post resection May 2012, adenocarcinoma of the left lung status post resection, small bowel resection status post ileorectal anastomosis with short gut syndrome, iron deficiency anemia, pulmonary embolism on apixaban, hypertension, CKD stage III, GERD, remote tobacco and alcohol use (quit 2012) presenting with fevers, chills, and rigors of 5 days duration.  The patient initially visited the emergency department on 04/02/2021 with fevers, chills, and nausea.  Blood cultures were obtained during that visit.  He was given ceftriaxone, azithromycin, and IV fluids.  His BP improved and tachycardia improved and he was discharged home in stable condition.  He was called back to come to the emergency department because of positive blood cultures.  He continued to have nausea, fevers, chills.  He complains of generalized weakness and shortness of breath.  He denies any current emesis.  He states that he has chronic diarrhea, 4-5 bowel movements daily.  He denies hematochezia but has occasional melena.  He denies any headache, neck pain, coughing, hemoptysis. Notably, the patient was recently admitted to the hospital from 01/08/2022 to 01/11/2022 due to symptomatic anemia.  He was given 2 unit PRBC.  He will underwent EGD on 01/10/2022 which showed grade a reflux esophagitis without bleeding.  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  01/11/2022 flex sig showed patent side-to-side ileal colonic anastomosis with healthy-appearing mucosa.  There was diffuse moderate inflammation at the colonic anastomosis.  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  His apixaban was restarted on 01/11/2022. In the ED, the patient had a low-grade temperature of 99.8 F.  His blood pressure was soft  with systolic blood pressures in the 100s.  Oxygen saturation was 99% on room air.  WBC 4.2, hemoglobin 10.7, platelets 24,000.  Sodium 130, potassium 3.0, bicarbonate 22, serum creatinine 1.09.  Blood cultures are showing gram-positive cocci in 2 out of 2 sets.  The patient was started on vancomycin.   Assessment/Plan: Bacteremia-CoNS -04/02/2022 blood culture--S epi and S hominis 2 out of 2 sets - 04/03/22 blood culture--S epi and S hominis -04/04/22 TTE--EF 60-65%, no vegetation -04/05/22 blood culture>>GPC -1/14 Echo EF 60-65%, G1DD, no vegetations, normal RVF -ID eval appreciated; recommending>> status post Port-A-Cath removal.  Catheter tip culture requested. -Repeat blood cultures -Follow TEE results (currently planning for procedure on 04/09/2022). -Continue current IV antibiotics at the moment.  Symptomatic anemia/melena -Once again, the patient's hemoglobin has drifted down from 11.6 on 03/17/22 -04/04/2021 hemoglobin 7.7>>7.4 -GI consult appreciated>> status post endoscopy evaluation; mild erosive reflux esophagitis appreciated.  Surgical [place] stomach consistent with prior surgical history.  Small bowel at least to the proximal jejunum appear normal.  Losing mucosa at the anastomosis appreciated (status post epinephrine injection and APC).  Given patient history of pancreatic cancer unable to rule out an element of Hemosuccus pancreaticus. -Patient received transfusion of 1 unit PRBC on 04/04/22 -Continue to follow hemoglobin trend, further transfuse as needed.  (Transition Tressel less than 7)..  Hyponatremia -Secondary to poor solute intake and volume depletion -Continue IV normal saline -Electrolytes trend.   Hypokalemia -Continue to follow electrolytes and further replete as needed -Magnesium within normal limits.   Hypophosphatemia -repleted  -Continue to follow electrolytes trend and further replete as needed.   Pancreatic adenocarcinoma -Status post FOLFIRINOX  10/16/2021>> 12/30/2021 -Patient was  started on TPN 03/19/2022 to improve his nutritional status for pancreatic tail resection -Pancreatic tail resection and splenectomy has been put on hold secondary to his poor nutritional status. -Continue outpatient follow-up with oncology service.   Iron deficiency anemia -04/20/2022 iron saturation 50 -Follow hemoglobin trend and transfuse as needed.   Pancytopenia due to antineoplastic chemotherapy (HCC) -Last FOLFIRINOX 01/01/22 -Continue to follow serial CBC -Anticipated lower hemoglobin level in the setting of acute bleed.   CKD 3a -baseline creatinine 1.0-1.3 -Appears to be stable and at baseline currently -Continue to maintain adequate hydration.   Chronic abdominal pain -Patient has chronic left-sided abdominal pain -Previously attributable to the patient's malignancy, unchanged in the past month -01/06/2022 MRI abdomen--area of restricted diffusion in the distal pancreas 2.0 x 1.9 cm.  slightly smaller mass in the splenic hilum -Continue as needed analgesics.  Overall well-controlled.   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   HTN (hypertension) -No longer take carvedilol; blood pressure overall stable. -Heart healthy discussed with patient. -Continue to follow vital signs.   Personal history of PE (pulmonary embolism) -Patient was taking apixaban prior to admission -Continue to hold apixaban given acute GI bleed concerns.   -Planning to resume anticoagulation once cleared by GI.    Family Communication:  spouse updated 1/15   Consultants:  GI, ID   Code Status:  FULL    DVT Prophylaxis:  SCDs     Procedures: As Listed in Progress Note Above   Antibiotics: Vanco 04/03/22>>   Subjective: No further GI bleed appreciated; denies nausea, vomiting, chest pain and shortness of breath.  Currently afebrile.  Patient has tolerated  Port-A-Cath removal without problem.  Objective: Vitals:   04/08/22 0535 04/08/22 0902 04/08/22 1048 04/08/22 1055  BP: 133/82 132/75 132/85 130/78  Pulse: 83 71 88 84  Resp: 18 18 20    Temp: 98.9 F (37.2 C) 98.8 F (37.1 C) 98.8 F (37.1 C) 98.7 F (37.1 C)  TempSrc:  Oral Oral Oral  SpO2: 98% 100% 98% 98%  Weight:      Height:        Intake/Output Summary (Last 24 hours) at 04/08/2022 1348 Last data filed at 04/08/2022 1330 Gross per 24 hour  Intake 2254.13 ml  Output 500 ml  Net 1754.13 ml   Weight change:   Exam: General exam: Alert, awake, oriented x 3; afebrile, reports no chest pain or shortness of breath currently.  Status post Port-A-Cath removal without complication. Respiratory system: Clear to auscultation. Respiratory effort normal.  Good saturation of failure.  No using accessory muscle. Cardiovascular system:RRR.  No rub, no gallops, no JVD. Gastrointestinal system: Abdomen is nondistended, soft and nontender. No organomegaly or masses felt. Normal bowel sounds heard. Central nervous system: Alert and oriented. No focal neurological deficits. Extremities: No cyanosis or clubbing. Skin: No petechiae. Psychiatry: Judgement and insight appear normal. Mood & affect appropriate.   Data Reviewed: I have personally reviewed following labs and imaging studies  Basic Metabolic Panel: Recent Labs  Lab 04/04/22 0456 04/05/22 0524 04/06/22 0524 04/07/22 0402 04/08/22 0357  NA 135 136 134* 136 136  K 3.0* 4.2 4.3 4.3 4.6  CL 107 109 106 108 108  CO2 20* 20* 23 26 23   GLUCOSE 135* 123* 118* 120* 116*  BUN 14 11 11 12 14   CREATININE 1.01 0.98 1.03 0.85 0.88  CALCIUM 7.1* 7.1* 7.2* 7.6* 7.7*  MG 2.4 2.1 1.7 2.2 1.7  PHOS  2.7 2.1* 3.1  --  4.4   Liver Function Tests: Recent Labs  Lab 04/02/22 1401 04/04/22 0456 04/05/22 0524 04/08/22 0357  AST 38 27 19 55*  ALT 36 24 20 49*  ALKPHOS 80 73 69 87  BILITOT 0.5 0.2* 0.3 0.3  PROT 5.4* 4.5* 4.2* 4.8*   ALBUMIN 1.9* 1.6* 1.5* 1.5*   Coagulation Profile: Recent Labs  Lab 04/02/22 1401  INR 1.5*   CBC: Recent Labs  Lab 04/02/22 1401 04/03/22 1623 04/04/22 0456 04/05/22 0524 04/06/22 0524 04/07/22 0402  WBC 3.5* 4.2 4.4 5.5 7.9 7.8  NEUTROABS 2.9 3.2  --   --   --   --   HGB 9.0* 7.7* 7.7* 7.4* 8.2* 8.2*  HCT 27.1* 23.3* 23.3* 22.6* 24.7* 24.9*  MCV 93.4 92.8 92.8 93.8 93.2 92.2  PLT 81* 74* 69* 71* 96* 124*   CBG: Recent Labs  Lab 04/08/22 0538 04/08/22 0739 04/08/22 0844 04/08/22 1020 04/08/22 1204  GLUCAP 107* 123* 92 94 104*   Urine analysis:    Component Value Date/Time   COLORURINE YELLOW 04/02/2022 1533   APPEARANCEUR HAZY (A) 04/02/2022 1533   LABSPEC 1.013 04/02/2022 1533   PHURINE 5.0 04/02/2022 1533   GLUCOSEU NEGATIVE 04/02/2022 1533   HGBUR NEGATIVE 04/02/2022 1533   BILIRUBINUR NEGATIVE 04/02/2022 1533   KETONESUR NEGATIVE 04/02/2022 1533   PROTEINUR 30 (A) 04/02/2022 1533   UROBILINOGEN 0.2 11/30/2014 1115   NITRITE NEGATIVE 04/02/2022 1533   LEUKOCYTESUR NEGATIVE 04/02/2022 1533   Sepsis Labs:  Recent Results (from the past 240 hour(s))  Resp panel by RT-PCR (RSV, Flu A&B, Covid) Anterior Nasal Swab     Status: None   Collection Time: 04/02/22 12:36 PM   Specimen: Anterior Nasal Swab  Result Value Ref Range Status   SARS Coronavirus 2 by RT PCR NEGATIVE NEGATIVE Final    Comment: (NOTE) SARS-CoV-2 target nucleic acids are NOT DETECTED.  The SARS-CoV-2 RNA is generally detectable in upper respiratory specimens during the acute phase of infection. The lowest concentration of SARS-CoV-2 viral copies this assay can detect is 138 copies/mL. A negative result does not preclude SARS-Cov-2 infection and should not be used as the sole basis for treatment or other patient management decisions. A negative result may occur with  improper specimen collection/handling, submission of specimen other than nasopharyngeal swab, presence of viral  mutation(s) within the areas targeted by this assay, and inadequate number of viral copies(<138 copies/mL). A negative result must be combined with clinical observations, patient history, and epidemiological information. The expected result is Negative.  Fact Sheet for Patients:  BloggerCourse.com  Fact Sheet for Healthcare Providers:  SeriousBroker.it  This test is no t yet approved or cleared by the [place] FDA and  has been authorized for detection and/or diagnosis of SARS-CoV-2 by FDA under an Emergency Use Authorization (EUA). This EUA will remain  in effect (meaning this test can be used) for the duration of the COVID-19 declaration under Section 564(b)(1) of the Act, 21 U.S.C.section 360bbb-3(b)(1), unless the authorization is terminated  or revoked sooner.       Influenza A by PCR NEGATIVE NEGATIVE Final   Influenza B by PCR NEGATIVE NEGATIVE Final    Comment: (NOTE) The Xpert Xpress SARS-CoV-2/FLU/RSV plus assay is intended as an aid in the diagnosis of influenza from Nasopharyngeal swab specimens and should not be used as a sole basis for treatment. Nasal washings and aspirates are unacceptable for Xpert Xpress SARS-CoV-2/FLU/RSV testing.  Fact Sheet for Patients:  BloggerCourse.com  Fact Sheet for Healthcare Providers: SeriousBroker.it  This test is not yet approved or cleared by the [place] FDA and has been authorized for detection and/or diagnosis of SARS-CoV-2 by FDA under an Emergency Use Authorization (EUA). This EUA will remain in effect (meaning this test can be used) for the duration of the COVID-19 declaration under Section 564(b)(1) of the Act, 21 U.S.C. section 360bbb-3(b)(1), unless the authorization is terminated or revoked.     Resp Syncytial Virus by PCR NEGATIVE NEGATIVE Final    Comment: (NOTE) Fact Sheet for  Patients: BloggerCourse.com  Fact Sheet for Healthcare Providers: SeriousBroker.it  This test is not yet approved or cleared by the [place] FDA and has been authorized for detection and/or diagnosis of SARS-CoV-2 by FDA under an Emergency Use Authorization (EUA). This EUA will remain in effect (meaning this test can be used) for the duration of the COVID-19 declaration under Section 564(b)(1) of the Act, 21 U.S.C. section 360bbb-3(b)(1), unless the authorization is terminated or revoked.  Performed at [facility], [address] [place], [place] [address]   Culture, blood (Routine x 2)     Status: Abnormal   Collection Time: 04/02/22  2:01 PM   Specimen: BLOOD  Result Value Ref Range Status   Specimen Description BLOOD LEFT ANTECUBITAL  Final   Special Requests   Final    BOTTLES DRAWN AEROBIC AND ANAEROBIC Blood Culture adequate volume   Culture  Setup Time   Final    GRAM POSITIVE COCCI ANAEROBIC BOTTLE ONLY Gram Stain Report Called to,Read Back By and Verified With: WHITE M @ 0838 ON [id number] BY HENDERSON L GRAM POSITIVE COCCI AEROBIC BOTTLE ONLY CRITICAL RESULT CALLED TO, READ BACK BY AND VERIFIED WITH:  C/ M. WHITE, RN 04/03/22 1400 A. LAFRANCE Performed at [facility], [address] [place], [place] [address]    Culture (A)  Final    STAPHYLOCOCCUS EPIDERMIDIS STAPHYLOCOCCUS HOMINIS    Report Status 04/06/2022 FINAL  Final   Organism ID, Bacteria STAPHYLOCOCCUS EPIDERMIDIS  Final   Organism ID, Bacteria STAPHYLOCOCCUS HOMINIS  Final      Susceptibility   Staphylococcus epidermidis - MIC*    CIPROFLOXACIN <=0.5 SENSITIVE Sensitive     ERYTHROMYCIN >=8 RESISTANT Resistant     GENTAMICIN <=0.5 SENSITIVE Sensitive     OXACILLIN >=4 RESISTANT Resistant     TETRACYCLINE 2 SENSITIVE Sensitive     VANCOMYCIN 1 SENSITIVE Sensitive     TRIMETH/SULFA <=10 SENSITIVE Sensitive     CLINDAMYCIN <=0.25 SENSITIVE  Sensitive     RIFAMPIN <=0.5 SENSITIVE Sensitive     Inducible Clindamycin NEGATIVE Sensitive     * STAPHYLOCOCCUS EPIDERMIDIS   Staphylococcus hominis - MIC*    CIPROFLOXACIN <=0.5 SENSITIVE Sensitive     ERYTHROMYCIN >=8 RESISTANT Resistant     GENTAMICIN <=0.5 SENSITIVE Sensitive     OXACILLIN >=4 RESISTANT Resistant     TETRACYCLINE >=16 RESISTANT Resistant     VANCOMYCIN <=0.5 SENSITIVE Sensitive     TRIMETH/SULFA <=10 SENSITIVE Sensitive     CLINDAMYCIN <=0.25 SENSITIVE Sensitive     RIFAMPIN <=0.5 SENSITIVE Sensitive     Inducible Clindamycin NEGATIVE Sensitive     * STAPHYLOCOCCUS HOMINIS  Culture, blood (Routine x 2)     Status: Abnormal   Collection Time: 04/02/22  2:01 PM   Specimen: BLOOD LEFT HAND  Result Value Ref Range Status   Specimen Description   Final    BLOOD LEFT HAND BOTTLES DRAWN AEROBIC  ONLY Performed at [facility], [address], [place], [place] [address]    Special Requests   Final    Blood Culture adequate volume Performed at [facility], [address] [place], [place] [address]    Culture  Setup Time   Final    GRAM POSITIVE COCCI AEROBIC BOTTLE ONLY Gram Stain Report Called to,Read Back By and Verified With: WHITE M @ 1334 ON [id number] BY HENDERSON L CRITICAL VALUE NOTED.  VALUE IS CONSISTENT WITH PREVIOUSLY REPORTED AND CALLED VALUE.    Culture (A)  Final    STAPHYLOCOCCUS EPIDERMIDIS STAPHYLOCOCCUS HOMINIS SUSCEPTIBILITIES PERFORMED ON PREVIOUS CULTURE WITHIN THE LAST 5 DAYS. Performed at [facility], [address] [place], [place] [address]    Report Status 04/06/2022 FINAL  Final  Blood Culture ID Panel (Reflexed)     Status: Abnormal   Collection Time: 04/02/22  2:01 PM  Result Value Ref Range Status   Enterococcus faecalis NOT DETECTED NOT DETECTED Final   Enterococcus Faecium NOT DETECTED NOT DETECTED Final   Listeria monocytogenes NOT DETECTED NOT DETECTED Final   Staphylococcus species DETECTED (A) NOT DETECTED Final     Comment: CRITICAL RESULT CALLED TO, READ BACK BY AND VERIFIED WITH:  C/ M. WHITE, RN 04/03/22 1400 A. LAFRANCE    Staphylococcus aureus (BCID) NOT DETECTED NOT DETECTED Final   Staphylococcus epidermidis DETECTED (A) NOT DETECTED Final    Comment: Methicillin (oxacillin) resistant coagulase negative staphylococcus. Possible blood culture contaminant (unless isolated from more than one blood culture draw or clinical case suggests pathogenicity). No antibiotic treatment is indicated for blood  culture contaminants. CRITICAL RESULT CALLED TO, READ BACK BY AND VERIFIED WITH:  C/ M. WHITE, RN 04/03/22 1400 A. LAFRANCE    Staphylococcus lugdunensis NOT DETECTED NOT DETECTED Final   Streptococcus species NOT DETECTED NOT DETECTED Final   Streptococcus agalactiae NOT DETECTED NOT DETECTED Final   Streptococcus pneumoniae NOT DETECTED NOT DETECTED Final   Streptococcus pyogenes NOT DETECTED NOT DETECTED Final   A.calcoaceticus-baumannii NOT DETECTED NOT DETECTED Final   Bacteroides fragilis NOT DETECTED NOT DETECTED Final   Enterobacterales NOT DETECTED NOT DETECTED Final   Enterobacter cloacae complex NOT DETECTED NOT DETECTED Final   Escherichia coli NOT DETECTED NOT DETECTED Final   Klebsiella aerogenes NOT DETECTED NOT DETECTED Final   Klebsiella oxytoca NOT DETECTED NOT DETECTED Final   Klebsiella pneumoniae NOT DETECTED NOT DETECTED Final   Proteus species NOT DETECTED NOT DETECTED Final   Salmonella species NOT DETECTED NOT DETECTED Final   Serratia marcescens NOT DETECTED NOT DETECTED Final   Haemophilus influenzae NOT DETECTED NOT DETECTED Final   Neisseria meningitidis NOT DETECTED NOT DETECTED Final   Pseudomonas aeruginosa NOT DETECTED NOT DETECTED Final   Stenotrophomonas maltophilia NOT DETECTED NOT DETECTED Final   Candida albicans NOT DETECTED NOT DETECTED Final   Candida auris NOT DETECTED NOT DETECTED Final   Candida glabrata NOT DETECTED NOT DETECTED Final   Candida krusei  NOT DETECTED NOT DETECTED Final   Candida parapsilosis NOT DETECTED NOT DETECTED Final   Candida tropicalis NOT DETECTED NOT DETECTED Final   Cryptococcus neoformans/gattii NOT DETECTED NOT DETECTED Final   Methicillin resistance mecA/C DETECTED (A) NOT DETECTED Final    Comment: CRITICAL RESULT CALLED TO, READ BACK BY AND VERIFIED WITH:  C/ M. WHITE, RN 04/03/22 1400 A. LAFRANCE Performed at [facility], [address], [place], [place] [address]   Urine Culture     Status: None  Collection Time: 04/02/22  3:33 PM   Specimen: Urine, Catheterized  Result Value Ref Range Status   Specimen Description   Final    URINE, CATHETERIZED Performed at [facility], [address], [place], [place] [address]    Special Requests   Final    NONE Performed at [facility], [address], [place], [place] [address]    Culture   Final    NO GROWTH Performed at [facility], [address] [place], [place] [address]    Report Status 04/04/2022 FINAL  Final  Blood culture (routine x 2)     Status: Abnormal   Collection Time: 04/03/22  4:23 PM   Specimen: BLOOD  Result Value Ref Range Status   Specimen Description BLOOD LEFT ANTECUBITAL  Final   Special Requests   Final    BOTTLES DRAWN AEROBIC AND ANAEROBIC Blood Culture adequate volume   Culture  Setup Time   Final    GRAM POSITIVE COCCI Gram Stain Report Called to,Read Back By and Verified With: T. Darcel Bayley  @ 0944 BY STEPHTR  04/04/22 IN BOTH AEROBIC AND ANAEROBIC BOTTLES CRITICAL VALUE NOTED.  VALUE IS CONSISTENT WITH PREVIOUSLY REPORTED AND CALLED VALUE. Performed at [facility], [address] [place], [place] [address]    Culture (A)  Final    STAPHYLOCOCCUS EPIDERMIDIS STAPHYLOCOCCUS HOMINIS STAPHYLOCOCCUS HOMINIS SUSCEPTIBILITIES PERFORMED ON PREVIOUS CULTURE WITHIN THE LAST 5 DAYS.    Report Status 04/07/2022 FINAL  Final   Organism ID, Bacteria STAPHYLOCOCCUS EPIDERMIDIS  Final      Susceptibility    Staphylococcus epidermidis - MIC*    CIPROFLOXACIN <=0.5 SENSITIVE Sensitive     ERYTHROMYCIN >=8 RESISTANT Resistant     GENTAMICIN <=0.5 SENSITIVE Sensitive     OXACILLIN >=4 RESISTANT Resistant     TETRACYCLINE 2 SENSITIVE Sensitive     VANCOMYCIN 2 SENSITIVE Sensitive     TRIMETH/SULFA <=10 SENSITIVE Sensitive     CLINDAMYCIN <=0.25 SENSITIVE Sensitive     RIFAMPIN <=0.5 SENSITIVE Sensitive     Inducible Clindamycin NEGATIVE Sensitive     * STAPHYLOCOCCUS EPIDERMIDIS  Blood culture (routine x 2)     Status: Abnormal   Collection Time: 04/03/22  4:28 PM   Specimen: BLOOD RIGHT HAND  Result Value Ref Range Status   Specimen Description BLOOD RIGHT HAND  Final   Special Requests   Final    BOTTLES DRAWN AEROBIC AND ANAEROBIC Blood Culture adequate volume   Culture  Setup Time   Final    GRAM POSITIVE COCCI Gram Stain Report Called to,Read Back By and Verified With:  T. LEONARD @ 0944 BY STEPHTR 04/04/22 IN BOTH AEROBIC AND ANAEROBIC BOTTLES Organism ID to follow CRITICAL RESULT CALLED TO, READ BACK BY AND VERIFIED WITH:  Perlie Gold, RN 04/04/22 1552 A. LAFRANCE Performed at [facility], [address] [place], [place] [address]    Culture (A)  Final    STAPHYLOCOCCUS EPIDERMIDIS STAPHYLOCOCCUS HOMINIS STAPHYLOCOCCUS HOMINIS SUSCEPTIBILITIES PERFORMED ON PREVIOUS CULTURE WITHIN THE LAST 5 DAYS.    Report Status 04/07/2022 FINAL  Final   Organism ID, Bacteria STAPHYLOCOCCUS EPIDERMIDIS  Final      Susceptibility   Staphylococcus epidermidis - MIC*    CIPROFLOXACIN <=0.5 SENSITIVE Sensitive     ERYTHROMYCIN >=8 RESISTANT Resistant     GENTAMICIN <=0.5 SENSITIVE Sensitive     OXACILLIN >=4 RESISTANT Resistant     TETRACYCLINE 2 SENSITIVE Sensitive     VANCOMYCIN 2  SENSITIVE Sensitive     TRIMETH/SULFA <=10 SENSITIVE Sensitive     CLINDAMYCIN <=0.25 SENSITIVE Sensitive     RIFAMPIN <=0.5 SENSITIVE Sensitive     Inducible Clindamycin NEGATIVE Sensitive     *  STAPHYLOCOCCUS EPIDERMIDIS  Blood Culture ID Panel (Reflexed)     Status: Abnormal   Collection Time: 04/03/22  4:28 PM  Result Value Ref Range Status   Enterococcus faecalis NOT DETECTED NOT DETECTED Final   Enterococcus Faecium NOT DETECTED NOT DETECTED Final   Listeria monocytogenes NOT DETECTED NOT DETECTED Final   Staphylococcus species DETECTED (A) NOT DETECTED Final    Comment: CRITICAL RESULT CALLED TO, READ BACK BY AND VERIFIED WITH:  Perlie Gold, RN 04/04/22 1552 A. LAFRANCE    Staphylococcus aureus (BCID) NOT DETECTED NOT DETECTED Final   Staphylococcus epidermidis DETECTED (A) NOT DETECTED Final    Comment: Methicillin (oxacillin) resistant coagulase negative staphylococcus. Possible blood culture contaminant (unless isolated from more than one blood culture draw or clinical case suggests pathogenicity). No antibiotic treatment is indicated for blood  culture contaminants. CRITICAL RESULT CALLED TO, READ BACK BY AND VERIFIED WITH:  Perlie Gold, RN 04/04/22 1552 A. LAFRANCE    Staphylococcus lugdunensis NOT DETECTED NOT DETECTED Final   Streptococcus species NOT DETECTED NOT DETECTED Final   Streptococcus agalactiae NOT DETECTED NOT DETECTED Final   Streptococcus pneumoniae NOT DETECTED NOT DETECTED Final   Streptococcus pyogenes NOT DETECTED NOT DETECTED Final   A.calcoaceticus-baumannii NOT DETECTED NOT DETECTED Final   Bacteroides fragilis NOT DETECTED NOT DETECTED Final   Enterobacterales NOT DETECTED NOT DETECTED Final   Enterobacter cloacae complex NOT DETECTED NOT DETECTED Final   Escherichia coli NOT DETECTED NOT DETECTED Final   Klebsiella aerogenes NOT DETECTED NOT DETECTED Final   Klebsiella oxytoca NOT DETECTED NOT DETECTED Final   Klebsiella pneumoniae NOT DETECTED NOT DETECTED Final   Proteus species NOT DETECTED NOT DETECTED Final   Salmonella species NOT DETECTED NOT DETECTED Final   Serratia marcescens NOT DETECTED NOT DETECTED Final   Haemophilus  influenzae NOT DETECTED NOT DETECTED Final   Neisseria meningitidis NOT DETECTED NOT DETECTED Final   Pseudomonas aeruginosa NOT DETECTED NOT DETECTED Final   Stenotrophomonas maltophilia NOT DETECTED NOT DETECTED Final   Candida albicans NOT DETECTED NOT DETECTED Final   Candida auris NOT DETECTED NOT DETECTED Final   Candida glabrata NOT DETECTED NOT DETECTED Final   Candida krusei NOT DETECTED NOT DETECTED Final   Candida parapsilosis NOT DETECTED NOT DETECTED Final   Candida tropicalis NOT DETECTED NOT DETECTED Final   Cryptococcus neoformans/gattii NOT DETECTED NOT DETECTED Final   Methicillin resistance mecA/C DETECTED (A) NOT DETECTED Final    Comment: CRITICAL RESULT CALLED TO, READ BACK BY AND VERIFIED WITH:  Perlie Gold, RN 04/04/22 1552 A. LAFRANCE Performed at [facility], [name] [place], [place] [address]   Culture, blood (Routine X 2) w Reflex to ID Panel     Status: None (Preliminary result)   Collection Time: 04/04/22 10:20 AM   Specimen: BLOOD RIGHT ARM  Result Value Ref Range Status   Specimen Description   Final    BLOOD RIGHT ARM BOTTLES DRAWN AEROBIC AND ANAEROBIC Performed at [facility], [address] [place], [place] [address]    Special Requests   Final    Blood Culture results may not be optimal due to an excessive volume of blood received in culture bottles Performed at [facility], [address], [place],  [place] [address]    Culture  Setup Time   Final    GRAM POSITIVE COCCI ANAEROBIC BOTTLE ONLY Gram Stain Report Called to,Read Back By and Verified With: BASS V. AT 1513 ON [id number] BY THOMPSON S. CRITICAL VALUE NOTED.  VALUE IS CONSISTENT WITH PREVIOUSLY REPORTED AND CALLED VALUE. Performed at [facility], [address], [place], [place] [address]    Culture   Final    NO GROWTH 4 DAYS Performed at [facility], [address], [place], [place] [address]    Report Status PENDING  Incomplete  Culture, blood (Routine X 2) w  Reflex to ID Panel     Status: None (Preliminary result)   Collection Time: 04/04/22 10:20 AM   Specimen: BLOOD RIGHT HAND  Result Value Ref Range Status   Specimen Description   Final    BLOOD RIGHT HAND BOTTLES DRAWN AEROBIC AND ANAEROBIC   Special Requests   Final    Blood Culture results may not be optimal due to an excessive volume of blood received in culture bottles   Culture   Final    NO GROWTH 4 DAYS Performed at [facility], [address] [place], [place] [address]    Report Status PENDING  Incomplete  Gastrointestinal Panel by PCR , Stool     Status: None   Collection Time: 04/04/22  2:24 PM  Result Value Ref Range Status   Campylobacter species NOT DETECTED NOT DETECTED Final   Plesimonas shigelloides NOT DETECTED NOT DETECTED Final   Salmonella species NOT DETECTED NOT DETECTED Final   Yersinia enterocolitica NOT DETECTED NOT DETECTED Final   Vibrio species NOT DETECTED NOT DETECTED Final   Vibrio cholerae NOT DETECTED NOT DETECTED Final   Enteroaggregative E coli (EAEC) NOT DETECTED NOT DETECTED Final   Enteropathogenic E coli (EPEC) NOT DETECTED NOT DETECTED Final   Enterotoxigenic E coli (ETEC) NOT DETECTED NOT DETECTED Final   Shiga like toxin producing E coli (STEC) NOT DETECTED NOT DETECTED Final   Shigella/Enteroinvasive E coli (EIEC) NOT DETECTED NOT DETECTED Final   Cryptosporidium NOT DETECTED NOT DETECTED Final   Cyclospora cayetanensis NOT DETECTED NOT DETECTED Final   Entamoeba histolytica NOT DETECTED NOT DETECTED Final   Giardia lamblia NOT DETECTED NOT DETECTED Final   Adenovirus F40/41 NOT DETECTED NOT DETECTED Final   Astrovirus NOT DETECTED NOT DETECTED Final   Norovirus GI/GII NOT DETECTED NOT DETECTED Final   Rotavirus A NOT DETECTED NOT DETECTED Final   Sapovirus (I, II, IV, and V) NOT DETECTED NOT DETECTED Final    Comment: Performed at [facility], [address] [place], [place] [address]  Culture, blood (single) w Reflex to ID  Panel     Status: Abnormal   Collection Time: 04/05/22  4:30 PM   Specimen: Right Antecubital; Blood  Result Value Ref Range Status   Specimen Description   Final    RIGHT ANTECUBITAL Performed at [facility], [address] [place], [place] [address]    Special Requests   Final    BOTTLES DRAWN AEROBIC AND ANAEROBIC Blood Culture adequate volume Performed at [facility], [address], [place], [place] [address]    Culture  Setup Time   Final    GRAM POSITIVE COCCI Gram Stain Report Called to,Read Back By and Verified With: [email or web address]  BY MATTHEWS, B 1.16.2024 IN BOTH AEROBIC AND ANAEROBIC BOTTLES CRITICAL VALUE NOTED.  VALUE IS CONSISTENT WITH PREVIOUSLY REPORTED AND CALLED VALUE.    Culture (A)  Final    STAPHYLOCOCCUS EPIDERMIDIS SUSCEPTIBILITIES PERFORMED ON PREVIOUS CULTURE WITHIN THE LAST 5 DAYS. Performed at [facility], [address] [place], [place] [address]    Report Status 04/08/2022 FINAL  Final     Scheduled Meds:  acidophilus  1 capsule Oral Daily   atorvastatin  20 mg Oral Daily   [START ON 04/09/2022] bacitracin   Topical Daily   Chlorhexidine Gluconate Cloth  6 each Topical Daily   cholestyramine light  4 g Oral Daily   dicyclomine  10 mg Oral TID AC & HS   diphenoxylate-atropine  1 tablet Oral QID   escitalopram  20 mg Oral Daily   gabapentin  100 mg Oral QHS   lipase/protease/amylase  108,000 Units Oral TID WC   megestrol  400 mg Oral BID   mirtazapine  30 mg Oral QHS   pantoprazole  40 mg Oral BID AC   Continuous Infusions:  dextrose 5 % and 0.45 % NaCl with KCl 10 mEq/L 75 mL/hr at 04/08/22 1330   vancomycin Stopped (04/08/22 1313)    Procedures/Studies: DG Chest Port 1 View  Result Date: 04/08/2022 CLINICAL DATA:  Port removal EXAM: PORTABLE CHEST 1 VIEW COMPARISON:  None Available. FINDINGS: Stable scarring or consolidation in the left hemithorax with decreased volumes. Right lung clear. No pneumothorax. No definite pleural effusion.  Aorta is ectatic. Postop changes epigastric region and right upper quadrant. Left-sided Port-A-Cath has IMPRESSION: Left hemithorax scarring or consolidation and hypoventilation. Otherwise no acute cardiopulmonary process. Electronically Signed   By: Layla Maw M.D.   On: 04/08/2022 10:52   ECHOCARDIOGRAM COMPLETE  Result Date: 04/04/2022    ECHOCARDIOGRAM REPORT   Patient Name:   BRETON BERNS Date of Exam: 04/04/2022 Medical Rec #:  [id number]        Height:       68.0 in Accession #:    [id number]       Weight:       120.0 lb Date of Birth:  06-14-1950       BSA:          1.645 m Patient Age:    71 years         BP:           95/46 mmHg Patient Gender: M                HR:           91 bpm. Exam Location:  Inpatient Procedure: 2D Echo, Cardiac Doppler and Color Doppler Indications:    Bacteremia  History:        Patient has prior history of Echocardiogram examinations, most                 recent 03/20/2016. Signs/Symptoms:Fever; Risk                 Factors:Hypertension. Cancer. Pulmonary embolus 2012.  Sonographer:    Sheralyn Boatman RDCS Referring Phys: [phone] DAVID TAT IMPRESSIONS  1. Left ventricular ejection fraction, by estimation, is 60 to 65%. Left ventricular ejection fraction by 2D MOD biplane is 60.6 %. The left ventricle has normal function. The left ventricle has no regional wall motion abnormalities. Left ventricular diastolic parameters are consistent with Grade I diastolic dysfunction (impaired relaxation).  2. Right ventricular systolic function is normal. The right ventricular size is normal. There is mildly elevated pulmonary artery systolic pressure. The estimated right ventricular systolic pressure is 43.2 mmHg.  3. The mitral valve  is grossly normal. Trivial mitral valve regurgitation.  4. The aortic valve is tricuspid. Aortic valve regurgitation is not visualized.  5. The inferior vena cava is normal in size with greater than 50% respiratory variability, suggesting right atrial  pressure of 3 mmHg. Conclusion(s)/Recommendation(s): No evidence of valvular vegetations on this transthoracic echocardiogram. Consider a transesophageal echocardiogram to exclude infective endocarditis if clinically indicated. FINDINGS  Left Ventricle: Left ventricular ejection fraction, by estimation, is 60 to 65%. Left ventricular ejection fraction by 2D MOD biplane is 60.6 %. The left ventricle has normal function. The left ventricle has no regional wall motion abnormalities. The left ventricular internal cavity size was normal in size. There is no left ventricular hypertrophy. Left ventricular diastolic parameters are consistent with Grade I diastolic dysfunction (impaired relaxation). Indeterminate filling pressures. Right Ventricle: The right ventricular size is normal. No increase in right ventricular wall thickness. Right ventricular systolic function is normal. There is mildly elevated pulmonary artery systolic pressure. The tricuspid regurgitant velocity is 3.17  m/s, and with an assumed right atrial pressure of 3 mmHg, the estimated right ventricular systolic pressure is 43.2 mmHg. Left Atrium: Left atrial size was normal in size. Right Atrium: Right atrial size was normal in size. Pericardium: There is no evidence of pericardial effusion. Mitral Valve: The mitral valve is grossly normal. Trivial mitral valve regurgitation. Tricuspid Valve: The tricuspid valve is grossly normal. Tricuspid valve regurgitation is mild. Aortic Valve: The aortic valve is tricuspid. Aortic valve regurgitation is not visualized. Pulmonic Valve: The pulmonic valve was normal in structure. Pulmonic valve regurgitation is not visualized. Aorta: The aortic root and ascending aorta are structurally normal, with no evidence of dilitation. Venous: The inferior vena cava is normal in size with greater than 50% respiratory variability, suggesting right atrial pressure of 3 mmHg. IAS/Shunts: No atrial level shunt detected by color flow  Doppler.  LEFT VENTRICLE PLAX 2D                        Biplane EF (MOD) LVIDd:         3.80 cm         LV Biplane EF:   Left LVIDs:         2.40 cm                          ventricular LV PW:         1.10 cm                          ejection LV IVS:        0.80 cm                          fraction by LVOT diam:     2.30 cm                          2D MOD LV SV:         90                               biplane is LV SV Index:   55  60.6 %. LVOT Area:     4.15 cm                                Diastology                                LV e' medial:    8.70 cm/s LV Volumes (MOD)               LV E/e' medial:  13.4 LV vol d, MOD    96.3 ml       LV e' lateral:   11.90 cm/s A2C:                           LV E/e' lateral: 9.8 LV vol d, MOD    90.4 ml A4C: LV vol s, MOD    41.7 ml A2C: LV vol s, MOD    38.0 ml A4C: LV SV MOD A2C:   54.6 ml LV SV MOD A4C:   90.4 ml LV SV MOD BP:    62.1 ml RIGHT VENTRICLE             IVC RV S prime:     16.00 cm/s  IVC diam: 1.80 cm TAPSE (M-mode): 1.8 cm LEFT ATRIUM             Index        RIGHT ATRIUM           Index LA diam:        3.60 cm 2.19 cm/m   RA Area:     13.50 cm LA Vol (A2C):   69.4 ml 42.19 ml/m  RA Volume:   36.00 ml  21.89 ml/m LA Vol (A4C):   27.6 ml 16.78 ml/m LA Biplane Vol: 46.0 ml 27.97 ml/m  AORTIC VALVE LVOT Vmax:   135.00 cm/s LVOT Vmean:  90.700 cm/s LVOT VTI:    0.216 m  AORTA Ao Root diam: 3.10 cm Ao Asc diam:  3.60 cm MITRAL VALVE                TRICUSPID VALVE MV Area (PHT): 4.15 cm     TR Peak grad:   40.2 mmHg MV Decel Time: 183 msec     TR Vmax:        317.00 cm/s MV E velocity: 116.67 cm/s MV A velocity: 96.57 cm/s   SHUNTS MV E/A ratio:  1.21         Systemic VTI:  0.22 m                             Systemic Diam: 2.30 cm Zoila Shutter MD Electronically signed by Zoila Shutter MD Signature Date/Time: 04/04/2022/5:27:58 PM    Final    DG Chest Port 1 View  Result Date: 04/02/2022 CLINICAL DATA:  Possible sepsis  EXAM: PORTABLE CHEST 1 VIEW COMPARISON:  01/08/2022 FINDINGS: Cardiac size is within normal limits. There is decreased volume in the left lung. Linear densities are seen in left mid and left lower lung fields. Surgical staples are seen in left parahilar region. Left hemidiaphragm is elevated. There is blunting of left lateral CP angle. There is no pneumothorax. Tip of left subclavian chest port is seen in superior vena cava. IMPRESSION: Postsurgical changes are noted  in left hemithorax. There are no new infiltrates or signs of pulmonary edema. Electronically Signed   By: Ernie Avena M.D.   On: 04/02/2022 13:21    Vassie Loll, MD [facility]  If 7PM-7AM, please contact night-coverage [email or web address] Password [email or web address] 04/08/2022, 1:48 PM   LOS: 5 days

## 2022-04-08 NOTE — Progress Notes (Signed)
[facility] Progress Note  1 Day Post-Op  Subjective: Patient seen and examined.  He is resting comfortably in bed.  He has no significant complaints related to his port-a-cath.  Objective: Vital signs in last 24 hours: Temp:  [98 F (36.7 C)-98.9 F (37.2 C)] 98.9 F (37.2 C) (01/18 0535) Pulse Rate:  [70-89] 83 (01/18 0535) Resp:  [14-25] 18 (01/18 0535) BP: (118-141)/(58-82) 133/82 (01/18 0535) SpO2:  [96 %-99 %] 98 % (01/18 0535) Weight:  [54.4 kg] 54.4 kg (01/17 1209) Last BM Date : 04/05/22  Intake/Output from previous day: 01/17 0701 - 01/18 0700 In: 980 [P.O.:480; I.V.:500] Out: 600 [Urine:600] Intake/Output this shift: Total I/O In: 610.5 [I.V.:610.5] Out: -   General appearance: alert, cooperative, and no distress Chest wall: left chest port a cath in place without erythema, induration, or tenderness  Lab Results:  Recent Labs    04/06/22 0524 04/07/22 0402  WBC 7.9 7.8  HGB 8.2* 8.2*  HCT 24.7* 24.9*  PLT 96* 124*   BMET Recent Labs    04/07/22 0402 04/08/22 0357  NA 136 136  K 4.3 4.6  CL 108 108  CO2 26 23  GLUCOSE 120* 116*  BUN 12 14  CREATININE 0.85 0.88  CALCIUM 7.6* 7.7*   PT/INR No results for input(s): "LABPROT", "INR" in the last 72 hours.  Studies/Results: No results found.  Anti-infectives: Anti-infectives (From admission, onward)    Start     Dose/Rate Route Frequency Ordered Stop   04/04/22 1900  [MAR Hold]  vancomycin (VANCOREADY) IVPB 750 mg/150 mL        (MAR Hold since Thu 04/08/2022 at 0835.Hold Reason: Transfer to a Procedural area)   750 mg 150 mL/hr over 60 Minutes Intravenous Every 24 hours 04/03/22 1927     04/03/22 1815  vancomycin (VANCOCIN) IVPB 1000 mg/200 mL premix        1,000 mg 200 mL/hr over 60 Minutes Intravenous  Once 04/03/22 1808 04/03/22 2016       Assessment/Plan:  Patient is a 72 year old male who was admitted with Staph epidermidis bacteremia with Port-A-Cath in place.    -Patient evaluated by ID, who is recommending Port-A-Cath removal -The risks and benefits of left subclavian Port-A-Cath removal were discussed with the patient, including but not limited to bleeding, infection, injury to surrounding structures, need for additional procedures.  After careful consideration, [name], has decided to proceed with Port-A-Cath removal -Plan for Port-a-cath removal today -Continue to hold Eliquis -Antibiotics per ID and primary team -Care per primary team   LOS: 5 days    Araya Roel A Zemira Zehring 04/08/2022

## 2022-04-08 NOTE — H&P (Signed)
Procedure H&P   Consult received from primary team for TEE in setting of coag neg staph bacteremia. Will plan for TEE Friday at 140pm with assistance from anesthesiology. For full medical history please see referenced admission H&P below  Dina Rich MD   Shared Decision Making/Informed Consent The risks [esophageal damage, perforation (1:10,000 risk), bleeding, pharyngeal hematoma as well as other potential complications associated with conscious sedation including aspiration, arrhythmia, respiratory failure and death], benefits (treatment guidance and diagnostic support) and alternatives of a transesophageal echocardiogram were discussed in detail with Mr. Egnor and he is willing to proceed.      History and Physical      Patient: NABEEL GLADSON PQM:[id number] DOB: May 24, 1950 DOA: 04/03/2022 DOS: the patient was seen and examined on 04/03/2022 PCP: Romeo Rabon, MD  Patient coming from: Home   Chief Complaint:     Chief Complaint  Patient presents with   Abnormal Lab    HPI: RENO CLASBY is a 72 y.o. male with medical history significant of stage III adenocarcinoma, GERD, hypertension, partial gastrectomy, pancreatic cancer, history of PE on anticoagulation's, chronic anemia.  Patient has been having 4 days of fevers with chills and rigors.  Her worst days were the first 2 days that have been getting better over the past 2 days.  He did present to the hospital yesterday for evaluation.  He had blood cultures, then ended up going home.  He still feels about the same as he did yesterday: Nausea with some intermittent fevers and chills.  He does still feel somewhat weak.  He did have a positive blood culture: One of his cultures came back positive for Staph epidermidis, the other was positive for MRSA.  He came back in because of the positive blood cultures.   Review of Systems: As mentioned in the history of present illness. All other systems reviewed and are negative.      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    Stage 3   Anemia     Anxiety     Arthritis     Barrett's esophagus     Blood transfusion     Bowel obstruction (HCC) 05/13/2012    Recurrent   Bronchitis     Chest pain at rest     Chronic abdominal pain     Erosive esophagitis     ETOH abuse      quit 03/2010   GERD (gastroesophageal reflux disease)     Hx of Clostridium difficile infection 01/2012   Hypertension     Ileus (HCC)     Iron deficiency anemia 03/23/2016   Lung cancer (HCC)     Obstruction of bowel (HCC) 03/03/2014   Osteoporosis     Pancreatic cancer (HCC)     Personal history of PE (pulmonary embolism) 10/01/2010   Pneumonia     Pulmonary embolism (HCC) 02/2010   Recurrent upper respiratory infection (URI)     Renal disorder     S/P endoscopy 09/28/2010    erosive reflux esophagitis, Billroth I anatomy   S/P partial gastrectomy 1980s   Seizures (HCC)      was on meds for 6 months, Unknown etiology, last seizure was 2017.   Shortness of breath     TIA (transient ischemic attack) 12/2009   Vitamin B12 deficiency           Past Surgical History:  Procedure Laterality Date   ABDOMINAL ADHESION SURGERY  03/04/15    @ [facility]   ABDOMINAL EXPLORATION SURGERY       abdominal sugery        for bowel obstruction x 8, all in 1980s, except for one in 07/2010   APPENDECTOMY   1980s   Billroth 1 hemigastrectomy   1980s    per patient for benign duodenal tumor   BIOPSY   01/10/2022    Procedure: BIOPSY;  Surgeon: Lanelle Bal, DO;  [facility];  Service: Endoscopy;;  gastric,ileum   CARDIAC CATHETERIZATION   07/17/2012   CHOLECYSTECTOMY   1980s   COLON SURGERY   May 2012    left hemicolectomy, colon cancer found at time of surgery for bowel obstruction   COLON SURGERY   10/12/2018    At [facility]: Resection of right colon and terminal ileum with creation of ileorectal anastomosis for large bowel  obstruction.  Cecum and ascending colon noted to be attached to the rectum.   COLONOSCOPY   03/18/2011    anastomosis at 35cm. Several adenomatous polyps removed. Sigmoid diverticulosis. Next TCS 02/2013   COLONOSCOPY N/A 07/24/2012    YVN:OFNEOV post segmental resection with normal-appearing colonic anastomosis aside from an adjacent polyp-removed as described above. Rectal polyp-removed as described above. CT findings appear to have been artifactual. tubular adenomas/prolapsed type polyp.   COLONOSCOPY N/A 05/15/2015    Procedure: COLONOSCOPY;  Surgeon: Corbin Ade, MD;  [facility];  Service: Endoscopy;  Laterality: N/A;   COLONOSCOPY   09/26/2018    at [facility]: prep not adequate for colon cancer surveillance.  Prior end-to-end colonic anastomosis in the rectosigmoid region.  This was patent and characterized by healthy-appearing mucosa.  Anastomosis was traversed.  Normal terminal ileum.   COLONOSCOPY WITH PROPOFOL N/A 11/04/2016    Dr. Jena Gauss, status post subtotal colectomy with normal-appearing residual lower GI tract.  Next colonoscopy in 5 years.   ESOPHAGOGASTRODUODENOSCOPY   09/28/2010   ESOPHAGOGASTRODUODENOSCOPY   12/01/2010    Cervical web status post dilation, erosive esophagitis, B1 hemigastrectomy, inflamed anastomosis   ESOPHAGOGASTRODUODENOSCOPY   04/16/2011    excoriation at [facility] c/w trauma/M-W tear, friable gastric anastomosis, dilation efferent limb   ESOPHAGOGASTRODUODENOSCOPY N/A 06/03/2014    Dr.[name]- cervcal esopphageal web s/p dilation. abnormal distal esophagus bx= barretts esophagus   ESOPHAGOGASTRODUODENOSCOPY N/A 05/15/2015    Procedure: ESOPHAGOGASTRODUODENOSCOPY (EGD);  Surgeon: Corbin Ade, MD;  [facility];  Service: Endoscopy;  Laterality: N/A;  230   ESOPHAGOGASTRODUODENOSCOPY (EGD) WITH ESOPHAGEAL DILATION   02/25/2012    PXC:[id number] esophageal web-s/p dilation anddisruption as described above. Status post prior gastric with Billroth I  configuration. Abnormal gastric mucosa at the anastomosis. Gastric biopsy showed mild chronic inflammation but no H. pylori    ESOPHAGOGASTRODUODENOSCOPY (EGD) WITH PROPOFOL N/A 11/04/2016    Dr. Jena Gauss: Reflux esophagitis, small hiatal hernia status post hemigastrectomy.  Single patent efferent small bowel limb appeared normal, 2 x 2 centimeter tongue of salmon epithelium again seen, esophageal dilation.  Biopsies consistent with reflux changes, not Barrett's however this was confirmed on prior EGDs.  Offer 3-year follow-up EGD August 2021.   ESOPHAGOGASTRODUODENOSCOPY (EGD) WITH PROPOFOL N/A 03/03/2020    Procedure: ESOPHAGOGASTRODUODENOSCOPY (EGD) WITH PROPOFOL;  Surgeon: Corbin Ade, MD;  [facility];  Service: Endoscopy;  Laterality: N/A;  3:00pm   ESOPHAGOGASTRODUODENOSCOPY (EGD) WITH PROPOFOL N/A 01/10/2022    Procedure: ESOPHAGOGASTRODUODENOSCOPY (EGD) WITH PROPOFOL;  Surgeon: Lanelle Bal, DO;  [facility];  Service: Endoscopy;  Laterality: N/A;   FLEXIBLE SIGMOIDOSCOPY N/A 01/10/2022    Procedure: FLEXIBLE SIGMOIDOSCOPY;  Surgeon: Lanelle Bal, DO;  [facility];  Service: Endoscopy;  Laterality: N/A;   HERNIA REPAIR        right inguinal   MALONEY DILATION N/A 06/03/2014    Procedure: Elease Hashimoto DILATION;  Surgeon: Corbin Ade, MD;  [facility];  Service: Endoscopy;  Laterality: N/A;   MALONEY DILATION N/A 05/15/2015    Procedure: Elease Hashimoto DILATION;  Surgeon: Corbin Ade, MD;  [facility];  Service: Endoscopy;  Laterality: N/A;   MALONEY DILATION N/A 11/04/2016    Procedure: Elease Hashimoto DILATION;  Surgeon: Corbin Ade, MD;  [facility];  Service: Endoscopy;  Laterality: N/A;   MALONEY DILATION N/A 03/03/2020    Procedure: Elease Hashimoto DILATION;  Surgeon: Corbin Ade, MD;  [facility];  Service: Endoscopy;  Laterality: N/A;   PORTACATH PLACEMENT       SAVORY DILATION N/A 06/03/2014    Procedure:  SAVORY DILATION;  Surgeon: Corbin Ade, MD;  [facility];  Service: Endoscopy;  Laterality: N/A;    Social History:  reports that he quit smoking about 9 years ago. His smoking use included cigarettes. He has a 20.00 pack-year smoking history. He has never used smokeless tobacco. He reports that he does not drink alcohol and does not use drugs.   No Known Allergies        Family History  Problem Relation Age of Onset   Hypertension Mother     Arthritis Mother     Pneumonia Mother     Hypertension Father     Heart attack Father     Bone cancer Cousin     Colon cancer Neg Hx               Prior to Admission medications   Medication Sig Start Date End Date Taking? Authorizing Provider  azithromycin (ZITHROMAX) 250 MG tablet Take 250 mg by mouth as directed. 03/31/22   Yes [provider]  apixaban (ELIQUIS) 5 MG TABS tablet Take 1 tablet (5 mg total) by mouth 2 (two) times daily. Restart 01/11/22 01/10/22     Catarina Hartshorn, MD  atorvastatin (LIPITOR) 20 MG tablet Take 20 mg by mouth daily.       [provider]  Bacillus Coagulans-Inulin (PROBIOTIC) 1-250 BILLION-MG CAPS Take 1 capsule by mouth daily.       [provider]  chlorproMAZINE (THORAZINE) 25 MG tablet Take 1 tablet (25 mg total) by mouth every 6 (six) hours as needed. Patient taking differently: Take 25 mg by mouth every 6 (six) hours as needed for nausea or vomiting. 01/01/22     Doreatha Massed, MD  Cholecalciferol (VITAMIN D) 2000 units CAPS Take 2,000 Units by mouth daily.       [provider]  Cholecalciferol (VITAMIN D3) 10 MCG (400 UNIT) tablet Take 400 Units by mouth daily.       [provider]  CREON 36000-114000 units CPEP capsule Take 72,000-108,000 Units by mouth See admin instructions. Take 3 capsules (108,000 units) by mouth with meals and take 2 capsules (72,000 units) with snacks. 11/18/21     [provider]  cyanocobalamin 100 MCG tablet  Take by mouth.       [provider]  diclofenac Sodium (VOLTAREN) 1 % GEL Apply 4 g topically 4 (four) times daily. 01/28/22     [provider]  dicyclomine (BENTYL) 10 MG capsule Take by mouth. 01/18/22 01/18/23   [provider]  diphenoxylate-atropine (LOMOTIL) 2.5-0.025 MG tablet Take 1 tablet by mouth 4 times daily 02/04/22     Tiffany Kocher, PA-C  escitalopram (LEXAPRO) 20 MG tablet Take 1 tablet (20 mg total) by mouth daily. 03/08/22     Doreatha Massed, MD  ferrous sulfate 325 (65 FE) MG EC tablet Take by mouth.       [provider]  gabapentin (NEURONTIN) 100 MG capsule TAKE 1 CAPSULE BY MOUTH EVERY DAY AT BEDTIME Patient taking differently: Take 100 mg by mouth at bedtime. 10/22/21     Doreatha Massed, MD  HYDROcodone-acetaminophen (NORCO/VICODIN) 5-325 MG tablet Take 1 tablet by mouth every 8 (eight) hours as needed for moderate pain. 03/17/22     Doreatha Massed, MD  magnesium oxide (MAG-OX) 400 (240 Mg) MG tablet TAKE 1 TABLET BY MOUTH TWICE A DAY 01/22/22     Doreatha Massed, MD  megestrol (MEGACE) 400 MG/10ML suspension Take 10 mLs (400 mg total) by mouth 2 (two) times daily. 02/22/22     Doreatha Massed, MD  mirtazapine (REMERON) 30 MG tablet TAKE 1 TABLET BY MOUTH AT BEDTIME 02/16/22     Doreatha Massed, MD  Multiple Vitamin (MULTIVITAMIN WITH MINERALS) TABS tablet Take 1 tablet by mouth daily.       [provider]  ondansetron (ZOFRAN-ODT) 4 MG disintegrating tablet TAKE 1 TABLET BY MOUTH EVERY 4 HOURS AS NEEDED FOR NAUSEA AND VOMITING 04/02/22     Eber Hong, MD  pantoprazole (PROTONIX) 40 MG tablet Take 1 tablet (40 mg total) by mouth 2 (two) times daily before a meal. 06/15/21     Tiffany Kocher, PA-C  PREVALITE 4 g packet Take 1 packet (4 g total) by mouth daily. DO NOT TAKE WITHIN 2 HOURS OF OTHER MEDICATIONS 08/04/21     Tiffany Kocher, PA-C  prochlorperazine (COMPAZINE) 10 MG tablet Take 10 mg by  mouth every 6 (six) hours as needed for vomiting or nausea.       [provider]  sucralfate (CARAFATE) 1 GM/10ML suspension TAKE 10 MLS (1 G TOTAL) BY MOUTH 4 (FOUR) TIMES DAILY AS NEEDED. FOR BREAKTHROUGH HEARTBURN Patient taking differently: Take 1 g by mouth 4 (four) times daily as needed (breakthrough heartburn). 10/22/20     Tiffany Kocher, PA-C  traMADol (ULTRAM) 50 MG tablet Take 1 tablet by mouth every 6 (six) hours as needed for moderate pain or severe pain. 09/07/21     [provider]      Physical Exam:       Vitals:    04/03/22 1723 04/03/22 1730 04/03/22 1800 04/03/22 1830  BP:   117/76 96/64 (!) 114/98  Pulse:   82 81 90  Resp:   (!) 24 19 19   Temp: 98.2 F (36.8 C)        TempSrc: Rectal        SpO2:   100% 100% 100%  Weight:          Height:            General: Elderly male. Awake and alert and oriented x3. No acute cardiopulmonary distress.  HEENT: Normocephalic atraumatic.  Right and left ears normal in appearance.  Pupils equal, round, reactive to light. Extraocular muscles are intact. Sclerae anicteric and noninjected.  Moist mucosal membranes. No mucosal lesions.  Neck: Neck supple without lymphadenopathy. No carotid bruits. No masses palpated.  Cardiovascular: Regular rate with normal S1-S2 sounds. No murmurs, rubs, gallops auscultated. No JVD.  Respiratory: Good respiratory effort with no wheezes, rales, rhonchi. Lungs clear to auscultation bilaterally.  No accessory muscle use. Abdomen: Soft, nontender, nondistended. Active bowel sounds. No masses or hepatosplenomegaly  Skin: No rashes, lesions, or ulcerations.  Dry, warm to touch. 2+ dorsalis pedis and radial pulses. Musculoskeletal: No calf or leg pain. All major joints not erythematous nontender.  No upper or lower joint deformation.  Good ROM.  No contractures  Psychiatric: Intact judgment and insight. Pleasant and cooperative. Neurologic: No focal neurological deficits. Strength is 5/5 and  symmetric in upper and lower extremities.  Cranial nerves II through XII are grossly intact.   Data Reviewed: Lab Results Last 24 Hours        Results for orders placed or performed during the hospital encounter of 04/03/22 (from the past 24 hour(s))  Blood culture (routine x 2)     Status: None (Preliminary result)    Collection Time: 04/03/22  4:23 PM    Specimen: Left Antecubital; Blood  Result Value Ref Range    Specimen Description          LEFT ANTECUBITAL BOTTLES DRAWN AEROBIC AND ANAEROBIC    Special Requests          Blood Culture adequate volume Performed at [facility], [address] [place], [place] [address]      Culture PENDING      Report Status PENDING    CBC with Differential     Status: Abnormal    Collection Time: 04/03/22  4:23 PM  Result Value Ref Range    WBC 4.2 4.0 - 10.5 K/uL    RBC 2.51 (L) 4.22 - 5.81 MIL/uL    Hemoglobin 7.7 (L) 13.0 - 17.0 g/dL    HCT 73.1 (L) 79.1 - 52.0 %    MCV 92.8 80.0 - 100.0 fL    MCH 30.7 26.0 - 34.0 pg    MCHC 33.0 30.0 - 36.0 g/dL    RDW 52.4 84.9 - 48.3 %    Platelets 74 (L) 150 - 400 K/uL    nRBC 0.0 0.0 - 0.2 %    Neutrophils Relative % 75 %    Neutro Abs 3.2 1.7 - 7.7 K/uL    Lymphocytes Relative 13 %    Lymphs Abs 0.5 (L) 0.7 - 4.0 K/uL    Monocytes Relative 10 %    Monocytes Absolute 0.4 0.1 - 1.0 K/uL    Eosinophils Relative 1 %    Eosinophils Absolute 0.0 0.0 - 0.5 K/uL    Basophils Relative 0 %    Basophils Absolute 0.0 0.0 - 0.1 K/uL    Immature Granulocytes 1 %    Abs Immature Granulocytes 0.03 0.00 - 0.07 K/uL  Basic metabolic panel     Status: Abnormal    Collection Time: 04/03/22  4:23 PM  Result Value Ref Range    Sodium 130 (L) 135 - 145 mmol/L    Potassium 3.0 (L) 3.5 - 5.1 mmol/L    Chloride 102 98 - 111 mmol/L    CO2 22 22 - 32 mmol/L    Glucose, Bld 94 70 - 99 mg/dL    BUN 15 8 - 23 mg/dL    Creatinine, Ser 5.59 0.61 - 1.24 mg/dL    Calcium 6.8 (L) 8.9 - 10.3 mg/dL    GFR, Estimated >97  >60 mL/min    Anion gap 6 5 - 15  Blood culture (routine x 2)     Status: None (Preliminary result)    Collection Time: 04/03/22  4:28 PM    Specimen: BLOOD RIGHT HAND  Result Value Ref Range    Specimen Description          BLOOD RIGHT HAND BOTTLES DRAWN AEROBIC AND ANAEROBIC    Special Requests          Blood Culture adequate volume Performed at [facility], [address], [place], [place] [address]      Culture PENDING      Report Status PENDING    Lactic acid, plasma     Status: None    Collection Time: 04/03/22  5:10 PM  Result Value Ref Range    Lactic Acid, Venous 0.9 0.5 - 1.9 mmol/L  Lactic acid, plasma     Status: None    Collection Time: 04/03/22  6:14 PM  Result Value Ref Range    Lactic Acid, Venous 1.6 0.5 - 1.9 mmol/L      No results found.     Assessment and Plan: No notes have been filed under this hospital service. Service: Hospitalist   Principal Problem:   Bacteremia Active Problems:   Adenocarcinoma of colon with mucinous features   Anemia, chronic disease   Fever with chills   S/P partial gastrectomy   Personal history of PE (pulmonary embolism)   HTN (hypertension)   Bacteremia Vancomycin started Repeat blood cultures Fevers with chills Repeat CBC in the morning Acute on chronic anemia Will collect Hemoccult to evaluate for occult blood loss I did discuss blood transfusion with the patient.  The patient is amenable to this.  Risks and benefits were discussed.  Will premedicate with Tylenol and Benadryl.  Follow-up CBC in the morning Status post partial gastrectomy Adenocarcinoma Patient is currently on TPN.  Will continue TPN Orders for pharmacy consult placed History of PE Continue anticoagulation    Advance Care Planning:   Code Status: Full Code confirmed by patient   Consults: None   Family Communication: Wife present during interview and exam   Severity of Illness: The appropriate patient status for this patient is INPATIENT.  Inpatient status is judged to be reasonable and necessary in order to provide the required intensity of service to ensure the patient's safety. The patient's presenting symptoms, physical exam findings, and initial radiographic and laboratory data in the context of their chronic comorbidities is felt to place them at high risk for further clinical deterioration. Furthermore, it is not anticipated that the patient will be medically stable for discharge from the hospital within 2 midnights of admission.    * I certify that at the point of admission it is my clinical judgment that the patient will require inpatient hospital care spanning beyond 2 midnights from the point of admission due to high intensity of service, high risk for further deterioration and high frequency of surveillance required.*   Author: Levie Heritage, DO 04/03/2022 7:17 PM

## 2022-04-08 NOTE — IPAL (Signed)
  Interdisciplinary Goals of Care Family Meeting   Date carried out: 04/08/2022  Location of the meeting: Bedside  Member's involved: Physician, Bedside Registered Nurse, and Family Member or next of kin  Durable Power of Attorney or acting medical decision maker: No power of attorney designated at this moment; patient oriented x 3 and competent to make his own decisions.  Wife at bedside in agreement to fulfill patient's wishes.  Discussion: We discussed goals of care for [name] .  After discussing in details plan of care and interventions if ever require patient has expressed not wanting cardiac resuscitation (CPR, intubation and/or mechanical ventilation.  Patient would like to continue current level of care regarding antibiotics, fluid resuscitation, TPN and ongoing follow-up with his oncologist for decision/determination of further chemotherapy.  Code status:   Code Status: DNR   Disposition: Continue current acute care  Time spent for the meeting: 35 minutes    Vassie Loll, MD  04/08/2022, 1:42 PM

## 2022-04-09 ENCOUNTER — Inpatient Hospital Stay (HOSPITAL_COMMUNITY): Payer: Medicare Other

## 2022-04-09 ENCOUNTER — Encounter (HOSPITAL_COMMUNITY)
Admission: EM | Disposition: A | Discharge status: Home / Self Care | Payer: Self-pay | Source: Home / Self Care | Attending: Internal Medicine

## 2022-04-09 ENCOUNTER — Other Ambulatory Visit: Payer: Self-pay

## 2022-04-09 ENCOUNTER — Inpatient Hospital Stay (HOSPITAL_COMMUNITY): Payer: Medicare Other | Admitting: Anesthesiology

## 2022-04-09 DIAGNOSIS — N179 Acute kidney failure, unspecified: Secondary | ICD-10-CM | POA: Diagnosis not present

## 2022-04-09 DIAGNOSIS — I129 Hypertensive chronic kidney disease with stage 1 through stage 4 chronic kidney disease, or unspecified chronic kidney disease: Secondary | ICD-10-CM | POA: Diagnosis not present

## 2022-04-09 DIAGNOSIS — N189 Chronic kidney disease, unspecified: Secondary | ICD-10-CM | POA: Diagnosis not present

## 2022-04-09 DIAGNOSIS — R7881 Bacteremia: Secondary | ICD-10-CM

## 2022-04-09 DIAGNOSIS — E785 Hyperlipidemia, unspecified: Secondary | ICD-10-CM | POA: Diagnosis not present

## 2022-04-09 DIAGNOSIS — D61810 Antineoplastic chemotherapy induced pancytopenia: Secondary | ICD-10-CM | POA: Diagnosis not present

## 2022-04-09 DIAGNOSIS — Z86711 Personal history of pulmonary embolism: Secondary | ICD-10-CM | POA: Diagnosis not present

## 2022-04-09 HISTORY — PX: TEE WITHOUT CARDIOVERSION: SHX5443

## 2022-04-09 LAB — GLUCOSE, CAPILLARY
Glucose-Capillary: 229 mg/dL — ABNORMAL HIGH (ref 70–99)
Glucose-Capillary: 74 mg/dL (ref 70–99)

## 2022-04-09 LAB — CULTURE, BLOOD (ROUTINE X 2): Culture: NO GROWTH

## 2022-04-09 LAB — ECHO TEE

## 2022-04-09 SURGERY — ECHOCARDIOGRAM, TRANSESOPHAGEAL
Anesthesia: General

## 2022-04-09 MED ORDER — PROPOFOL 500 MG/50ML IV EMUL
INTRAVENOUS | Status: DC | PRN
  Administered 2022-04-09: 150 ug/kg/min via INTRAVENOUS

## 2022-04-09 MED ORDER — LIDOCAINE VISCOUS HCL 2 % MT SOLN
OROMUCOSAL | Status: AC
  Filled 2022-04-09: qty 15

## 2022-04-09 MED ORDER — VANCOMYCIN HCL 1250 MG/250ML IV SOLN
1250.0000 mg | Freq: Once | INTRAVENOUS | Status: AC
  Administered 2022-04-09: 1250 mg via INTRAVENOUS

## 2022-04-09 MED ORDER — LIDOCAINE 2% (20 MG/ML) 5 ML SYRINGE
INTRAMUSCULAR | Status: DC | PRN
  Administered 2022-04-09: 60 mg via INTRAVENOUS

## 2022-04-09 MED ORDER — ENSURE ENLIVE PO LIQD
237.0000 mL | Freq: Three times a day (TID) | ORAL | Status: DC
  Administered 2022-04-09 – 2022-04-12 (×4): 237 mL via ORAL

## 2022-04-09 MED ORDER — SODIUM CHLORIDE 0.9 % IV SOLN
8.0000 mg/kg | Freq: Every day | INTRAVENOUS | Status: DC
  Administered 2022-04-10 – 2022-04-13 (×4): 450 mg via INTRAVENOUS
  Filled 2022-04-09 (×5): qty 9

## 2022-04-09 MED ORDER — LACTATED RINGERS IV SOLN: INTRAVENOUS | Status: DC

## 2022-04-09 MED ORDER — PROPOFOL 10 MG/ML IV BOLUS
INTRAVENOUS | Status: DC | PRN
  Administered 2022-04-09 (×2): 20 mg via INTRAVENOUS

## 2022-04-09 MED ORDER — LIDOCAINE VISCOUS HCL 2 % MT SOLN
10.0000 mL | Freq: Once | OROMUCOSAL | Status: AC
  Administered 2022-04-09: 10 mL via OROMUCOSAL

## 2022-04-09 MED ORDER — LACTATED RINGERS IV SOLN: INTRAVENOUS | Status: DC | PRN

## 2022-04-09 MED ORDER — SODIUM CHLORIDE 0.9 % IV SOLN: INTRAVENOUS | Status: DC

## 2022-04-09 NOTE — Anesthesia Preprocedure Evaluation (Signed)
Anesthesia Evaluation  Patient identified by MRN, date of birth, ID band Patient awake    Reviewed: Allergy & Precautions, H&P , NPO status , Patient's Chart, lab work & pertinent test results, reviewed documented beta blocker date and time   Airway Mallampati: II  TM Distance: >3 FB Neck ROM: full    Dental  (+) Edentulous Lower, Edentulous Upper   Pulmonary shortness of breath, Recent URI , former smoker, PE   Pulmonary exam normal breath sounds clear to auscultation       Cardiovascular Exercise Tolerance: Good hypertension,  Rhythm:regular Rate:Normal     Neuro/Psych Seizures -,  PSYCHIATRIC DISORDERS Anxiety     TIA   GI/Hepatic Neg liver ROS, PUD,GERD  Medicated,,Barrett's esophagus Dysphagia, pharyngoesophageal phase Erosive esophagitis Esophageal reflux disease     Endo/Other  negative endocrine ROS    Renal/GU CRF and ARFRenal disease  negative genitourinary   Musculoskeletal  (+) Arthritis ,    Abdominal   Peds  Hematology  (+) Blood dyscrasia, anemia   Anesthesia Other Findings TEE requested the in setting of coag neg staph bacteremia  Adenocarcinoma of colon pancreatic cancer  Reproductive/Obstetrics negative OB ROS                              Anesthesia Physical Anesthesia Plan  ASA: 4  Anesthesia Plan: General   Post-op Pain Management:    Induction:   PONV Risk Score and Plan: Propofol infusion  Airway Management Planned:   Additional Equipment:   Intra-op Plan:   Post-operative Plan:   Informed Consent: I have reviewed the patients History and Physical, chart, labs and discussed the procedure including the risks, benefits and alternatives for the proposed anesthesia with the patient or authorized representative who has indicated his/her understanding and acceptance.     Dental Advisory Given  Plan Discussed with: CRNA  Anesthesia Plan Comments:           Anesthesia Quick Evaluation

## 2022-04-09 NOTE — Progress Notes (Signed)
PROGRESS NOTE  [name] JLL:[id number] DOB: 20-Oct-1950 DOA: 04/03/2022 PCP: Romeo Rabon, MD  Brief History:  72 year old male with a history of pancreatic adenocarcinoma, colon adenocarcinoma status post resection May 2012, adenocarcinoma of the left lung status post resection, small bowel resection status post ileorectal anastomosis with short gut syndrome, iron deficiency anemia, pulmonary embolism on apixaban, hypertension, CKD stage III, GERD, remote tobacco and alcohol use (quit 2012) presenting with fevers, chills, and rigors of 5 days duration.  The patient initially visited the emergency department on 04/02/2021 with fevers, chills, and nausea.  Blood cultures were obtained during that visit.  He was given ceftriaxone, azithromycin, and IV fluids.  His BP improved and tachycardia improved and he was discharged home in stable condition.  He was called back to come to the emergency department because of positive blood cultures.  He continued to have nausea, fevers, chills.  He complains of generalized weakness and shortness of breath.  He denies any current emesis.  He states that he has chronic diarrhea, 4-5 bowel movements daily.  He denies hematochezia but has occasional melena.  He denies any headache, neck pain, coughing, hemoptysis. Notably, the patient was recently admitted to the hospital from 01/08/2022 to 01/11/2022 due to symptomatic anemia.  He was given 2 unit PRBC.  He will underwent EGD on 01/10/2022 which showed grade a reflux esophagitis without bleeding.  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  01/11/2022 flex sig showed patent side-to-side ileal colonic anastomosis with healthy-appearing mucosa.  There was diffuse moderate inflammation at the colonic anastomosis.  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  His apixaban was restarted on 01/11/2022. In the ED, the patient had a low-grade temperature of 99.8 F.  His blood pressure was soft  with systolic blood pressures in the 100s.  Oxygen saturation was 99% on room air.  WBC 4.2, hemoglobin 10.7, platelets 24,000.  Sodium 130, potassium 3.0, bicarbonate 22, serum creatinine 1.09.  Blood cultures are showing gram-positive cocci in 2 out of 2 sets.  The patient was started on vancomycin.   Assessment/Plan: Bacteremia-CoNS -04/02/2022 blood culture--S epi and S hominis 2 out of 2 sets - 04/03/22 blood culture--S epi and S hominis -04/04/22 TTE--EF 60-65%, no vegetation -04/05/22 blood culture>>GPC -1/14 Echo EF 60-65%, G1DD, no vegetations, normal RVF -ID eval appreciated; recommending>> status post Port-A-Cath removal.  Catheter tip culture requested. -Repeat blood cultures -Follow TEE results; plan for procedure later today. -Continue current IV antibiotics at the moment. -Will follow any further recommendations by ID service.  Symptomatic anemia/melena -Once again, the patient's hemoglobin has drifted down from 11.6 on 03/17/22 -hemoglobin trend 7.7>>7.4>>8.2 -GI consult appreciated>> status post endoscopy evaluation; mild erosive reflux esophagitis appreciated.  Surgical [place] stomach consistent with prior surgical history.  Small bowel at least to the proximal jejunum appear normal.  Losing mucosa at the anastomosis appreciated (status post epinephrine injection and APC).  Given patient history of pancreatic cancer unable to rule out an element of Hemosuccus pancreaticus. -Patient received transfusion of 1 unit PRBC on 04/04/22 -Continue to follow hemoglobin trend, further transfuse as needed.  (Transition threshold less than 7)..  Hyponatremia -Secondary to poor solute intake and volume depletion -Continue IV normal saline -Electrolytes trend.   Hypokalemia -Continue to follow electrolytes and further replete as needed -Magnesium within normal limits.   Hypophosphatemia -repleted  -Continue to follow electrolytes trend and further replete as needed.   Pancreatic  adenocarcinoma -  Status post FOLFIRINOX 10/16/2021>> 12/30/2021 -Patient was started on TPN 03/19/2022 to improve his nutritional status for pancreatic tail resection -Pancreatic tail resection and splenectomy has been put on hold secondary to his poor nutritional status. -Continue outpatient follow-up with oncology service.   Iron deficiency anemia -04/20/2022 iron saturation 50 -Follow hemoglobin trend and transfuse as needed.   Pancytopenia due to antineoplastic chemotherapy (HCC) -Last FOLFIRINOX 01/01/22 -Continue to follow serial CBC -Anticipated lower hemoglobin level in the setting of acute bleed.   CKD 3a -baseline creatinine 1.0-1.3 -Appears to be stable and at baseline currently -Continue to maintain adequate hydration.   Chronic abdominal pain -Patient has chronic left-sided abdominal pain -Previously attributable to the patient's malignancy, unchanged in the past month -01/06/2022 MRI abdomen--area of restricted diffusion in the distal pancreas 2.0 x 1.9 cm.  slightly smaller mass in the splenic hilum -Continue as needed analgesics.  Overall well-controlled.   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   HTN (hypertension) -No longer take carvedilol; blood pressure overall stable. -Heart healthy discussed with patient. -Continue to follow vital signs.   Personal history of PE (pulmonary embolism) -Patient was taking apixaban prior to admission -Continue to hold apixaban given acute GI bleed concerns.   -Planning to resume anticoagulation once cleared by GI.    Family Communication:  spouse updated at bedside (04/09/2022).   Consultants:  GI, ID   Code Status:  FULL    DVT Prophylaxis:  SCDs     Procedures: As Listed in Progress Note Above   Antibiotics: Vanco 04/03/22>>   Subjective: Afebrile, no chest pain, no nausea, no vomiting.  No overt bleeding appreciated.   Currently n.p.o. with anticipation for TEE later today.  Objective: Vitals:   04/09/22 1245 04/09/22 1334 04/09/22 1345 04/09/22 1400  BP: 130/80 117/62 122/74 112/88  Pulse: 76 65 69 67  Resp: 16 18 20 16   Temp: 98.3 F (36.8 C) 98.3 F (36.8 C)    TempSrc:      SpO2: 96% 100% 99% 97%  Weight:      Height:        Intake/Output Summary (Last 24 hours) at 04/09/2022 1435 Last data filed at 04/09/2022 1328 Gross per 24 hour  Intake 1631.45 ml  Output 2575 ml  Net -943.55 ml   Weight change:   Exam: General exam: Alert, awake, oriented x 3; no fever, no chest pain, no nausea, no vomiting.  Currently n.p.o. in anticipation for TEE later today. Respiratory system: Good air movement bilaterally; no using accessory muscle.  Good saturation on room air. Cardiovascular system:RRR. No rubs or gallops; no JVD. Gastrointestinal system: Abdomen is nondistended, soft and nontender. No organomegaly or masses felt. Normal bowel sounds heard. Central nervous system: Alert and oriented. No focal neurological deficits. Extremities: No cyanosis, clubbing or edema. Skin: No petechiae. Psychiatry: Judgement and insight appear normal. Mood & affect appropriate.   Data Reviewed: I have personally reviewed following labs and imaging studies  Basic Metabolic Panel: Recent Labs  Lab 04/04/22 0456 04/05/22 0524 04/06/22 0524 04/07/22 0402 04/08/22 0357  NA 135 136 134* 136 136  K 3.0* 4.2 4.3 4.3 4.6  CL 107 109 106 108 108  CO2 20* 20* 23 26 23   GLUCOSE 135* 123* 118* 120* 116*  BUN 14 11 11 12 14   CREATININE 1.01 0.98 1.03 0.85 0.88  CALCIUM 7.1* 7.1* 7.2* 7.6* 7.7*  MG 2.4 2.1 1.7 2.2 1.7  PHOS 2.7  2.1* 3.1  --  4.4   Liver Function Tests: Recent Labs  Lab 04/04/22 0456 04/05/22 0524 04/08/22 0357  AST 27 19 55*  ALT 24 20 49*  ALKPHOS 73 69 87  BILITOT 0.2* 0.3 0.3  PROT 4.5* 4.2* 4.8*  ALBUMIN 1.6* 1.5* 1.5*   Coagulation Profile: No results for input(s): "INR",  "PROTIME" in the last 168 hours.  CBC: Recent Labs  Lab 04/03/22 1623 04/04/22 0456 04/05/22 0524 04/06/22 0524 04/07/22 0402  WBC 4.2 4.4 5.5 7.9 7.8  NEUTROABS 3.2  --   --   --   --   HGB 7.7* 7.7* 7.4* 8.2* 8.2*  HCT 23.3* 23.3* 22.6* 24.7* 24.9*  MCV 92.8 92.8 93.8 93.2 92.2  PLT 74* 69* 71* 96* 124*   CBG: Recent Labs  Lab 04/08/22 0844 04/08/22 1020 04/08/22 1204 04/08/22 1630 04/08/22 2154  GLUCAP 92 94 104* 106* 112*   Urine analysis:    Component Value Date/Time   COLORURINE YELLOW 04/02/2022 1533   APPEARANCEUR HAZY (A) 04/02/2022 1533   LABSPEC 1.013 04/02/2022 1533   PHURINE 5.0 04/02/2022 1533   GLUCOSEU NEGATIVE 04/02/2022 1533   HGBUR NEGATIVE 04/02/2022 1533   BILIRUBINUR NEGATIVE 04/02/2022 1533   KETONESUR NEGATIVE 04/02/2022 1533   PROTEINUR 30 (A) 04/02/2022 1533   UROBILINOGEN 0.2 11/30/2014 1115   NITRITE NEGATIVE 04/02/2022 1533   LEUKOCYTESUR NEGATIVE 04/02/2022 1533   Sepsis Labs:  Recent Results (from the past 240 hour(s))  Resp panel by RT-PCR (RSV, Flu A&B, Covid) Anterior Nasal Swab     Status: None   Collection Time: 04/02/22 12:36 PM   Specimen: Anterior Nasal Swab  Result Value Ref Range Status   SARS Coronavirus 2 by RT PCR NEGATIVE NEGATIVE Final    Comment: (NOTE) SARS-CoV-2 target nucleic acids are NOT DETECTED.  The SARS-CoV-2 RNA is generally detectable in upper respiratory specimens during the acute phase of infection. The lowest concentration of SARS-CoV-2 viral copies this assay can detect is 138 copies/mL. A negative result does not preclude SARS-Cov-2 infection and should not be used as the sole basis for treatment or other patient management decisions. A negative result may occur with  improper specimen collection/handling, submission of specimen other than nasopharyngeal swab, presence of viral mutation(s) within the areas targeted by this assay, and inadequate number of viral copies(<138 copies/mL). A  negative result must be combined with clinical observations, patient history, and epidemiological information. The expected result is Negative.  Fact Sheet for Patients:  BloggerCourse.com  Fact Sheet for Healthcare Providers:  SeriousBroker.it  This test is no t yet approved or cleared by the [place] FDA and  has been authorized for detection and/or diagnosis of SARS-CoV-2 by FDA under an Emergency Use Authorization (EUA). This EUA will remain  in effect (meaning this test can be used) for the duration of the COVID-19 declaration under Section 564(b)(1) of the Act, 21 U.S.C.section 360bbb-3(b)(1), unless the authorization is terminated  or revoked sooner.       Influenza A by PCR NEGATIVE NEGATIVE Final   Influenza B by PCR NEGATIVE NEGATIVE Final    Comment: (NOTE) The Xpert Xpress SARS-CoV-2/FLU/RSV plus assay is intended as an aid in the diagnosis of influenza from Nasopharyngeal swab specimens and should not be used as a sole basis for treatment. Nasal washings and aspirates are unacceptable for Xpert Xpress SARS-CoV-2/FLU/RSV testing.  Fact Sheet for Patients: BloggerCourse.com  Fact Sheet for Healthcare Providers: SeriousBroker.it  This test is not yet approved or  cleared by the [place] and has been authorized for detection and/or diagnosis of SARS-CoV-2 by FDA under an Emergency Use Authorization (EUA). This EUA will remain in effect (meaning this test can be used) for the duration of the COVID-19 declaration under Section 564(b)(1) of the Act, 21 U.S.C. section 360bbb-3(b)(1), unless the authorization is terminated or revoked.     Resp Syncytial Virus by PCR NEGATIVE NEGATIVE Final    Comment: (NOTE) Fact Sheet for Patients: BloggerCourse.com  Fact Sheet for Healthcare  Providers: SeriousBroker.it  This test is not yet approved or cleared by the [place] FDA and has been authorized for detection and/or diagnosis of SARS-CoV-2 by FDA under an Emergency Use Authorization (EUA). This EUA will remain in effect (meaning this test can be used) for the duration of the COVID-19 declaration under Section 564(b)(1) of the Act, 21 U.S.C. section 360bbb-3(b)(1), unless the authorization is terminated or revoked.  Performed at [facility], [address] [place], [place] [address]   Culture, blood (Routine x 2)     Status: Abnormal   Collection Time: 04/02/22  2:01 PM   Specimen: BLOOD  Result Value Ref Range Status   Specimen Description BLOOD LEFT ANTECUBITAL  Final   Special Requests   Final    BOTTLES DRAWN AEROBIC AND ANAEROBIC Blood Culture adequate volume   Culture  Setup Time   Final    GRAM POSITIVE COCCI ANAEROBIC BOTTLE ONLY Gram Stain Report Called to,Read Back By and Verified With: WHITE M @ 0838 ON [id number] BY HENDERSON L GRAM POSITIVE COCCI AEROBIC BOTTLE ONLY CRITICAL RESULT CALLED TO, READ BACK BY AND VERIFIED WITH:  C/ M. WHITE, RN 04/03/22 1400 A. LAFRANCE Performed at [facility], [address] [place], [place] [address]    Culture (A)  Final    STAPHYLOCOCCUS EPIDERMIDIS STAPHYLOCOCCUS HOMINIS    Report Status 04/06/2022 FINAL  Final   Organism ID, Bacteria STAPHYLOCOCCUS EPIDERMIDIS  Final   Organism ID, Bacteria STAPHYLOCOCCUS HOMINIS  Final      Susceptibility   Staphylococcus epidermidis - MIC*    CIPROFLOXACIN <=0.5 SENSITIVE Sensitive     ERYTHROMYCIN >=8 RESISTANT Resistant     GENTAMICIN <=0.5 SENSITIVE Sensitive     OXACILLIN >=4 RESISTANT Resistant     TETRACYCLINE 2 SENSITIVE Sensitive     VANCOMYCIN 1 SENSITIVE Sensitive     TRIMETH/SULFA <=10 SENSITIVE Sensitive     CLINDAMYCIN <=0.25 SENSITIVE Sensitive     RIFAMPIN <=0.5 SENSITIVE Sensitive     Inducible Clindamycin NEGATIVE  Sensitive     * STAPHYLOCOCCUS EPIDERMIDIS   Staphylococcus hominis - MIC*    CIPROFLOXACIN <=0.5 SENSITIVE Sensitive     ERYTHROMYCIN >=8 RESISTANT Resistant     GENTAMICIN <=0.5 SENSITIVE Sensitive     OXACILLIN >=4 RESISTANT Resistant     TETRACYCLINE >=16 RESISTANT Resistant     VANCOMYCIN <=0.5 SENSITIVE Sensitive     TRIMETH/SULFA <=10 SENSITIVE Sensitive     CLINDAMYCIN <=0.25 SENSITIVE Sensitive     RIFAMPIN <=0.5 SENSITIVE Sensitive     Inducible Clindamycin NEGATIVE Sensitive     * STAPHYLOCOCCUS HOMINIS  Culture, blood (Routine x 2)     Status: Abnormal   Collection Time: 04/02/22  2:01 PM   Specimen: BLOOD LEFT HAND  Result Value Ref Range Status   Specimen Description   Final    BLOOD LEFT HAND BOTTLES DRAWN AEROBIC ONLY Performed at [facility], [address], [place], [place] [address]    Special  Requests   Final    Blood Culture adequate volume Performed at [facility], [address], [place], [place] [address]    Culture  Setup Time   Final    GRAM POSITIVE COCCI AEROBIC BOTTLE ONLY Gram Stain Report Called to,Read Back By and Verified With: WHITE M @ 1334 ON [id number] BY HENDERSON L CRITICAL VALUE NOTED.  VALUE IS CONSISTENT WITH PREVIOUSLY REPORTED AND CALLED VALUE.    Culture (A)  Final    STAPHYLOCOCCUS EPIDERMIDIS STAPHYLOCOCCUS HOMINIS SUSCEPTIBILITIES PERFORMED ON PREVIOUS CULTURE WITHIN THE LAST 5 DAYS. Performed at [facility], [address], [place], [place] [address]    Report Status 04/06/2022 FINAL  Final  Blood Culture ID Panel (Reflexed)     Status: Abnormal   Collection Time: 04/02/22  2:01 PM  Result Value Ref Range Status   Enterococcus faecalis NOT DETECTED NOT DETECTED Final   Enterococcus Faecium NOT DETECTED NOT DETECTED Final   Listeria monocytogenes NOT DETECTED NOT DETECTED Final   Staphylococcus species DETECTED (A) NOT DETECTED Final    Comment: CRITICAL RESULT CALLED TO, READ BACK BY AND VERIFIED WITH:  C/ M. WHITE, RN  04/03/22 1400 A. LAFRANCE    Staphylococcus aureus (BCID) NOT DETECTED NOT DETECTED Final   Staphylococcus epidermidis DETECTED (A) NOT DETECTED Final    Comment: Methicillin (oxacillin) resistant coagulase negative staphylococcus. Possible blood culture contaminant (unless isolated from more than one blood culture draw or clinical case suggests pathogenicity). No antibiotic treatment is indicated for blood  culture contaminants. CRITICAL RESULT CALLED TO, READ BACK BY AND VERIFIED WITH:  C/ M. WHITE, RN 04/03/22 1400 A. LAFRANCE    Staphylococcus lugdunensis NOT DETECTED NOT DETECTED Final   Streptococcus species NOT DETECTED NOT DETECTED Final   Streptococcus agalactiae NOT DETECTED NOT DETECTED Final   Streptococcus pneumoniae NOT DETECTED NOT DETECTED Final   Streptococcus pyogenes NOT DETECTED NOT DETECTED Final   A.calcoaceticus-baumannii NOT DETECTED NOT DETECTED Final   Bacteroides fragilis NOT DETECTED NOT DETECTED Final   Enterobacterales NOT DETECTED NOT DETECTED Final   Enterobacter cloacae complex NOT DETECTED NOT DETECTED Final   Escherichia coli NOT DETECTED NOT DETECTED Final   Klebsiella aerogenes NOT DETECTED NOT DETECTED Final   Klebsiella oxytoca NOT DETECTED NOT DETECTED Final   Klebsiella pneumoniae NOT DETECTED NOT DETECTED Final   Proteus species NOT DETECTED NOT DETECTED Final   Salmonella species NOT DETECTED NOT DETECTED Final   Serratia marcescens NOT DETECTED NOT DETECTED Final   Haemophilus influenzae NOT DETECTED NOT DETECTED Final   Neisseria meningitidis NOT DETECTED NOT DETECTED Final   Pseudomonas aeruginosa NOT DETECTED NOT DETECTED Final   Stenotrophomonas maltophilia NOT DETECTED NOT DETECTED Final   Candida albicans NOT DETECTED NOT DETECTED Final   Candida auris NOT DETECTED NOT DETECTED Final   Candida glabrata NOT DETECTED NOT DETECTED Final   Candida krusei NOT DETECTED NOT DETECTED Final   Candida parapsilosis NOT DETECTED NOT DETECTED  Final   Candida tropicalis NOT DETECTED NOT DETECTED Final   Cryptococcus neoformans/gattii NOT DETECTED NOT DETECTED Final   Methicillin resistance mecA/C DETECTED (A) NOT DETECTED Final    Comment: CRITICAL RESULT CALLED TO, READ BACK BY AND VERIFIED WITH:  C/ M. WHITE, RN 04/03/22 1400 A. LAFRANCE Performed at [facility], [address], [place], [place] [address]   Urine Culture     Status: None   Collection Time: 04/02/22  3:33 PM   Specimen: Urine, Catheterized  Result Value Ref Range  Status   Specimen Description   Final    URINE, CATHETERIZED Performed at [facility], [address] [place], [place] [address]    Special Requests   Final    NONE Performed at [facility], [address], [place], [place] [address]    Culture   Final    NO GROWTH Performed at [facility], [place] [address] [place], [place] [address]    Report Status 04/04/2022 FINAL  Final  Blood culture (routine x 2)     Status: Abnormal   Collection Time: 04/03/22  4:23 PM   Specimen: BLOOD  Result Value Ref Range Status   Specimen Description BLOOD LEFT ANTECUBITAL  Final   Special Requests   Final    BOTTLES DRAWN AEROBIC AND ANAEROBIC Blood Culture adequate volume   Culture  Setup Time   Final    GRAM POSITIVE COCCI Gram Stain Report Called to,Read Back By and Verified With: T. Hollice Espy  @ [id number] BY STEPHTR  04/04/22 IN BOTH AEROBIC AND ANAEROBIC BOTTLES CRITICAL VALUE NOTED.  VALUE IS CONSISTENT WITH PREVIOUSLY REPORTED AND CALLED VALUE. Performed at [facility], [place] [address] [place], [place] [address]    Culture (A)  Final    STAPHYLOCOCCUS EPIDERMIDIS STAPHYLOCOCCUS HOMINIS STAPHYLOCOCCUS HOMINIS SUSCEPTIBILITIES PERFORMED ON PREVIOUS CULTURE WITHIN THE LAST 5 DAYS.    Report Status 04/07/2022 FINAL  Final   Organism ID, Bacteria STAPHYLOCOCCUS EPIDERMIDIS  Final      Susceptibility   Staphylococcus epidermidis - MIC*    CIPROFLOXACIN <=0.5 SENSITIVE Sensitive      ERYTHROMYCIN >=8 RESISTANT Resistant     GENTAMICIN <=0.5 SENSITIVE Sensitive     OXACILLIN >=4 RESISTANT Resistant     TETRACYCLINE 2 SENSITIVE Sensitive     VANCOMYCIN 2 SENSITIVE Sensitive     TRIMETH/SULFA <=10 SENSITIVE Sensitive     CLINDAMYCIN <=0.25 SENSITIVE Sensitive     RIFAMPIN <=0.5 SENSITIVE Sensitive     Inducible Clindamycin NEGATIVE Sensitive     * STAPHYLOCOCCUS EPIDERMIDIS  Blood culture (routine x 2)     Status: Abnormal   Collection Time: 04/03/22  4:28 PM   Specimen: BLOOD RIGHT HAND  Result Value Ref Range Status   Specimen Description BLOOD RIGHT HAND  Final   Special Requests   Final    BOTTLES DRAWN AEROBIC AND ANAEROBIC Blood Culture adequate volume   Culture  Setup Time   Final    GRAM POSITIVE COCCI Gram Stain Report Called to,Read Back By and Verified With:  T. LEONARD @ [id number] BY STEPHTR 04/04/22 IN BOTH AEROBIC AND ANAEROBIC BOTTLES Organism ID to follow CRITICAL RESULT CALLED TO, READ BACK BY AND VERIFIED WITH:  Jeannine Boga, RN 04/04/22 1552 A. LAFRANCE Performed at [facility], [place] [address], [place], [place] [address]    Culture (A)  Final    STAPHYLOCOCCUS EPIDERMIDIS STAPHYLOCOCCUS HOMINIS STAPHYLOCOCCUS HOMINIS SUSCEPTIBILITIES PERFORMED ON PREVIOUS CULTURE WITHIN THE LAST 5 DAYS.    Report Status 04/07/2022 FINAL  Final   Organism ID, Bacteria STAPHYLOCOCCUS EPIDERMIDIS  Final      Susceptibility   Staphylococcus epidermidis - MIC*    CIPROFLOXACIN <=0.5 SENSITIVE Sensitive     ERYTHROMYCIN >=8 RESISTANT Resistant     GENTAMICIN <=0.5 SENSITIVE Sensitive     OXACILLIN >=4 RESISTANT Resistant     TETRACYCLINE 2 SENSITIVE Sensitive     VANCOMYCIN 2 SENSITIVE Sensitive     TRIMETH/SULFA <=10 SENSITIVE Sensitive     CLINDAMYCIN <=0.25  SENSITIVE Sensitive     RIFAMPIN <=0.5 SENSITIVE Sensitive     Inducible Clindamycin NEGATIVE Sensitive     * STAPHYLOCOCCUS EPIDERMIDIS  Blood Culture ID Panel (Reflexed)     Status: Abnormal    Collection Time: 04/03/22  4:28 PM  Result Value Ref Range Status   Enterococcus faecalis NOT DETECTED NOT DETECTED Final   Enterococcus Faecium NOT DETECTED NOT DETECTED Final   Listeria monocytogenes NOT DETECTED NOT DETECTED Final   Staphylococcus species DETECTED (A) NOT DETECTED Final    Comment: CRITICAL RESULT CALLED TO, READ BACK BY AND VERIFIED WITH:  Perlie Gold, RN 04/04/22 1552 A. LAFRANCE    Staphylococcus aureus (BCID) NOT DETECTED NOT DETECTED Final   Staphylococcus epidermidis DETECTED (A) NOT DETECTED Final    Comment: Methicillin (oxacillin) resistant coagulase negative staphylococcus. Possible blood culture contaminant (unless isolated from more than one blood culture draw or clinical case suggests pathogenicity). No antibiotic treatment is indicated for blood  culture contaminants. CRITICAL RESULT CALLED TO, READ BACK BY AND VERIFIED WITH:  Perlie Gold, RN 04/04/22 1552 A. LAFRANCE    Staphylococcus lugdunensis NOT DETECTED NOT DETECTED Final   Streptococcus species NOT DETECTED NOT DETECTED Final   Streptococcus agalactiae NOT DETECTED NOT DETECTED Final   Streptococcus pneumoniae NOT DETECTED NOT DETECTED Final   Streptococcus pyogenes NOT DETECTED NOT DETECTED Final   A.calcoaceticus-baumannii NOT DETECTED NOT DETECTED Final   Bacteroides fragilis NOT DETECTED NOT DETECTED Final   Enterobacterales NOT DETECTED NOT DETECTED Final   Enterobacter cloacae complex NOT DETECTED NOT DETECTED Final   Escherichia coli NOT DETECTED NOT DETECTED Final   Klebsiella aerogenes NOT DETECTED NOT DETECTED Final   Klebsiella oxytoca NOT DETECTED NOT DETECTED Final   Klebsiella pneumoniae NOT DETECTED NOT DETECTED Final   Proteus species NOT DETECTED NOT DETECTED Final   Salmonella species NOT DETECTED NOT DETECTED Final   Serratia marcescens NOT DETECTED NOT DETECTED Final   Haemophilus influenzae NOT DETECTED NOT DETECTED Final   Neisseria meningitidis NOT DETECTED NOT  DETECTED Final   Pseudomonas aeruginosa NOT DETECTED NOT DETECTED Final   Stenotrophomonas maltophilia NOT DETECTED NOT DETECTED Final   Candida albicans NOT DETECTED NOT DETECTED Final   Candida auris NOT DETECTED NOT DETECTED Final   Candida glabrata NOT DETECTED NOT DETECTED Final   Candida krusei NOT DETECTED NOT DETECTED Final   Candida parapsilosis NOT DETECTED NOT DETECTED Final   Candida tropicalis NOT DETECTED NOT DETECTED Final   Cryptococcus neoformans/gattii NOT DETECTED NOT DETECTED Final   Methicillin resistance mecA/C DETECTED (A) NOT DETECTED Final    Comment: CRITICAL RESULT CALLED TO, READ BACK BY AND VERIFIED WITH:  Perlie Gold, RN 04/04/22 1552 A. LAFRANCE Performed at [facility], [address], [place], [place] [address]   Culture, blood (Routine X 2) w Reflex to ID Panel     Status: Abnormal   Collection Time: 04/04/22 10:20 AM   Specimen: BLOOD RIGHT ARM  Result Value Ref Range Status   Specimen Description   Final    BLOOD RIGHT ARM BOTTLES DRAWN AEROBIC AND ANAEROBIC   Special Requests   Final    Blood Culture results may not be optimal due to an excessive volume of blood received in culture bottles   Culture  Setup Time   Final    GRAM POSITIVE COCCI ANAEROBIC BOTTLE ONLY Gram Stain Report Called to,Read Back By and Verified With: BASS V. AT [id number] ON [id number] BY THOMPSON S. CRITICAL VALUE  NOTED.  VALUE IS CONSISTENT WITH PREVIOUSLY REPORTED AND CALLED VALUE.    Culture (A)  Final    STAPHYLOCOCCUS EPIDERMIDIS SUSCEPTIBILITIES PERFORMED ON PREVIOUS CULTURE WITHIN THE LAST 5 DAYS.    Report Status 04/09/2022 FINAL  Final  Culture, blood (Routine X 2) w Reflex to ID Panel     Status: None   Collection Time: 04/04/22 10:20 AM   Specimen: BLOOD RIGHT HAND  Result Value Ref Range Status   Specimen Description   Final    BLOOD RIGHT HAND BOTTLES DRAWN AEROBIC AND ANAEROBIC   Special Requests   Final    Blood Culture results may not be optimal due to  an excessive volume of blood received in culture bottles   Culture   Final    NO GROWTH 5 DAYS Performed at [facility], [address], [place], [place] [address]    Report Status 04/09/2022 FINAL  Final  Gastrointestinal Panel by PCR , Stool     Status: None   Collection Time: 04/04/22  2:24 PM  Result Value Ref Range Status   Campylobacter species NOT DETECTED NOT DETECTED Final   Plesimonas shigelloides NOT DETECTED NOT DETECTED Final   Salmonella species NOT DETECTED NOT DETECTED Final   Yersinia enterocolitica NOT DETECTED NOT DETECTED Final   Vibrio species NOT DETECTED NOT DETECTED Final   Vibrio cholerae NOT DETECTED NOT DETECTED Final   Enteroaggregative E coli (EAEC) NOT DETECTED NOT DETECTED Final   Enteropathogenic E coli (EPEC) NOT DETECTED NOT DETECTED Final   Enterotoxigenic E coli (ETEC) NOT DETECTED NOT DETECTED Final   Shiga like toxin producing E coli (STEC) NOT DETECTED NOT DETECTED Final   Shigella/Enteroinvasive E coli (EIEC) NOT DETECTED NOT DETECTED Final   Cryptosporidium NOT DETECTED NOT DETECTED Final   Cyclospora cayetanensis NOT DETECTED NOT DETECTED Final   Entamoeba histolytica NOT DETECTED NOT DETECTED Final   Giardia lamblia NOT DETECTED NOT DETECTED Final   Adenovirus F40/41 NOT DETECTED NOT DETECTED Final   Astrovirus NOT DETECTED NOT DETECTED Final   Norovirus GI/GII NOT DETECTED NOT DETECTED Final   Rotavirus A NOT DETECTED NOT DETECTED Final   Sapovirus (I, II, IV, and V) NOT DETECTED NOT DETECTED Final    Comment: Performed at [facility], [address] [place], [place] [address]  Culture, blood (single) w Reflex to ID Panel     Status: Abnormal   Collection Time: 04/05/22  4:30 PM   Specimen: Right Antecubital; Blood  Result Value Ref Range Status   Specimen Description   Final    RIGHT ANTECUBITAL Performed at [facility], [address], [place], [place] [address]    Special Requests   Final    BOTTLES DRAWN AEROBIC AND  ANAEROBIC Blood Culture adequate volume Performed at [facility], [address], [place], [place] [address]    Culture  Setup Time   Final    GRAM POSITIVE COCCI Gram Stain Report Called to,Read Back By and Verified With: MUSE,[email or web address]  BY MATTHEWS, B 1.16.2024 IN BOTH AEROBIC AND ANAEROBIC BOTTLES CRITICAL VALUE NOTED.  VALUE IS CONSISTENT WITH PREVIOUSLY REPORTED AND CALLED VALUE.    Culture (A)  Final    STAPHYLOCOCCUS EPIDERMIDIS SUSCEPTIBILITIES PERFORMED ON PREVIOUS CULTURE WITHIN THE LAST 5 DAYS. Performed at [facility], [address] [place], [place] [address]    Report Status 04/08/2022 FINAL  Final  Culture, blood (Routine X 2) w Reflex to ID Panel     Status: None (Preliminary result)  Collection Time: 04/08/22  9:28 AM   Specimen: BLOOD  Result Value Ref Range Status   Specimen Description BLOOD RIGHT ANTECUBITAL  Final   Special Requests   Final    BOTTLES DRAWN AEROBIC AND ANAEROBIC Blood Culture adequate volume   Culture   Final    NO GROWTH < 24 HOURS Performed at [facility], [address] [place], [place] [address]    Report Status PENDING  Incomplete  Culture, blood (Routine X 2) w Reflex to ID Panel     Status: None (Preliminary result)   Collection Time: 04/08/22  9:33 AM   Specimen: BLOOD  Result Value Ref Range Status   Specimen Description BLOOD BLOOD LEFT HAND  Final   Special Requests   Final    BOTTLES DRAWN AEROBIC AND ANAEROBIC Blood Culture adequate volume   Culture   Final    NO GROWTH < 24 HOURS Performed at [facility], [address] [place], [place] [address]    Report Status PENDING  Incomplete  Cath Tip Culture     Status: None (Preliminary result)   Collection Time: 04/08/22 10:10 AM   Specimen: Catheter Tip; Other  Result Value Ref Range Status   Specimen Description   Final    PORTA CATH Performed at [facility], [address] [place], [place] [address]    Special Requests   Final    NONE Performed at [facility], [address], [place], [place] [address]    Culture   Final    CULTURE REINCUBATED FOR BETTER GROWTH Performed at [facility], [address], [place], [place] [address]    Report Status PENDING  Incomplete     Scheduled Meds:  acidophilus  1 capsule Oral Daily   atorvastatin  20 mg Oral Daily   bacitracin   Topical Daily   Chlorhexidine Gluconate Cloth  6 each Topical Daily   cholestyramine light  4 g Oral Daily   dicyclomine  10 mg Oral TID AC & HS   diphenoxylate-atropine  1 tablet Oral QID   escitalopram  20 mg Oral Daily   feeding supplement  237 mL Oral TID BM   gabapentin  100 mg Oral QHS   lipase/protease/amylase  108,000 Units Oral TID WC   megestrol  400 mg Oral BID   mirtazapine  30 mg Oral QHS   pantoprazole  40 mg Oral BID AC   Continuous Infusions:  [START ON 04/10/2022] DAPTOmycin (CUBICIN) 450 mg in sodium chloride 0.9 % IVPB      Procedures/Studies: DG Chest Port 1 View  Result Date: 04/08/2022 CLINICAL DATA:  Port removal EXAM: PORTABLE CHEST 1 VIEW COMPARISON:  None Available. FINDINGS: Stable scarring or consolidation in the left hemithorax with decreased volumes. Right lung clear. No pneumothorax. No definite pleural effusion. Aorta is ectatic. Postop changes epigastric region and right upper quadrant. Left-sided Port-A-Cath has IMPRESSION: Left hemithorax scarring or consolidation and hypoventilation. Otherwise no acute cardiopulmonary process. Electronically Signed   By: Layla Maw M.D.   On: 04/08/2022 10:52   ECHOCARDIOGRAM COMPLETE  Result Date: 04/04/2022    ECHOCARDIOGRAM REPORT   Patient Name:   POWELL HALBERT Date of Exam: 04/04/2022 Medical Rec #:  [id number]        Height:       68.0 in Accession #:    [id number]       Weight:       120.0 lb Date of Birth:  1950-07-30  BSA:          1.645 m Patient Age:    71 years         BP:           95/46 mmHg Patient Gender: M                HR:           91 bpm. Exam Location:   Inpatient Procedure: 2D Echo, Cardiac Doppler and Color Doppler Indications:    Bacteremia  History:        Patient has prior history of Echocardiogram examinations, most                 recent 03/20/2016. Signs/Symptoms:Fever; Risk                 Factors:Hypertension. Cancer. Pulmonary embolus 2012.  Sonographer:    Sheralyn Boatman RDCS Referring Phys: [phone] DAVID TAT IMPRESSIONS  1. Left ventricular ejection fraction, by estimation, is 60 to 65%. Left ventricular ejection fraction by 2D MOD biplane is 60.6 %. The left ventricle has normal function. The left ventricle has no regional wall motion abnormalities. Left ventricular diastolic parameters are consistent with Grade I diastolic dysfunction (impaired relaxation).  2. Right ventricular systolic function is normal. The right ventricular size is normal. There is mildly elevated pulmonary artery systolic pressure. The estimated right ventricular systolic pressure is 43.2 mmHg.  3. The mitral valve is grossly normal. Trivial mitral valve regurgitation.  4. The aortic valve is tricuspid. Aortic valve regurgitation is not visualized.  5. The inferior vena cava is normal in size with greater than 50% respiratory variability, suggesting right atrial pressure of 3 mmHg. Conclusion(s)/Recommendation(s): No evidence of valvular vegetations on this transthoracic echocardiogram. Consider a transesophageal echocardiogram to exclude infective endocarditis if clinically indicated. FINDINGS  Left Ventricle: Left ventricular ejection fraction, by estimation, is 60 to 65%. Left ventricular ejection fraction by 2D MOD biplane is 60.6 %. The left ventricle has normal function. The left ventricle has no regional wall motion abnormalities. The left ventricular internal cavity size was normal in size. There is no left ventricular hypertrophy. Left ventricular diastolic parameters are consistent with Grade I diastolic dysfunction (impaired relaxation). Indeterminate filling pressures. Right  Ventricle: The right ventricular size is normal. No increase in right ventricular wall thickness. Right ventricular systolic function is normal. There is mildly elevated pulmonary artery systolic pressure. The tricuspid regurgitant velocity is 3.17  m/s, and with an assumed right atrial pressure of 3 mmHg, the estimated right ventricular systolic pressure is 43.2 mmHg. Left Atrium: Left atrial size was normal in size. Right Atrium: Right atrial size was normal in size. Pericardium: There is no evidence of pericardial effusion. Mitral Valve: The mitral valve is grossly normal. Trivial mitral valve regurgitation. Tricuspid Valve: The tricuspid valve is grossly normal. Tricuspid valve regurgitation is mild. Aortic Valve: The aortic valve is tricuspid. Aortic valve regurgitation is not visualized. Pulmonic Valve: The pulmonic valve was normal in structure. Pulmonic valve regurgitation is not visualized. Aorta: The aortic root and ascending aorta are structurally normal, with no evidence of dilitation. Venous: The inferior vena cava is normal in size with greater than 50% respiratory variability, suggesting right atrial pressure of 3 mmHg. IAS/Shunts: No atrial level shunt detected by color flow Doppler.  LEFT VENTRICLE PLAX 2D                        Biplane EF (MOD) LVIDd:  3.80 cm         LV Biplane EF:   Left LVIDs:         2.40 cm                          ventricular LV PW:         1.10 cm                          ejection LV IVS:        0.80 cm                          fraction by LVOT diam:     2.30 cm                          2D MOD LV SV:         90                               biplane is LV SV Index:   55                               60.6 %. LVOT Area:     4.15 cm                                Diastology                                LV e' medial:    8.70 cm/s LV Volumes (MOD)               LV E/e' medial:  13.4 LV vol d, MOD    96.3 ml       LV e' lateral:   11.90 cm/s A2C:                            LV E/e' lateral: 9.8 LV vol d, MOD    90.4 ml A4C: LV vol s, MOD    41.7 ml A2C: LV vol s, MOD    38.0 ml A4C: LV SV MOD A2C:   54.6 ml LV SV MOD A4C:   90.4 ml LV SV MOD BP:    62.1 ml RIGHT VENTRICLE             IVC RV S prime:     16.00 cm/s  IVC diam: 1.80 cm TAPSE (M-mode): 1.8 cm LEFT ATRIUM             Index        RIGHT ATRIUM           Index LA diam:        3.60 cm 2.19 cm/m   RA Area:     13.50 cm LA Vol (A2C):   69.4 ml 42.19 ml/m  RA Volume:   36.00 ml  21.89 ml/m LA Vol (A4C):   27.6 ml 16.78 ml/m LA Biplane Vol: 46.0 ml 27.97 ml/m  AORTIC VALVE LVOT Vmax:   135.00 cm/s LVOT Vmean:  90.700 cm/s LVOT VTI:  0.216 m  AORTA Ao Root diam: 3.10 cm Ao Asc diam:  3.60 cm MITRAL VALVE                TRICUSPID VALVE MV Area (PHT): 4.15 cm     TR Peak grad:   40.2 mmHg MV Decel Time: 183 msec     TR Vmax:        317.00 cm/s MV E velocity: 116.67 cm/s MV A velocity: 96.57 cm/s   SHUNTS MV E/A ratio:  1.21         Systemic VTI:  0.22 m                             Systemic Diam: 2.30 cm Zoila Shutter MD Electronically signed by Zoila Shutter MD Signature Date/Time: 04/04/2022/5:27:58 PM    Final    DG Chest Port 1 View  Result Date: 04/02/2022 CLINICAL DATA:  Possible sepsis EXAM: PORTABLE CHEST 1 VIEW COMPARISON:  01/08/2022 FINDINGS: Cardiac size is within normal limits. There is decreased volume in the left lung. Linear densities are seen in left mid and left lower lung fields. Surgical staples are seen in left parahilar region. Left hemidiaphragm is elevated. There is blunting of left lateral CP angle. There is no pneumothorax. Tip of left subclavian chest port is seen in superior vena cava. IMPRESSION: Postsurgical changes are noted in left hemithorax. There are no new infiltrates or signs of pulmonary edema. Electronically Signed   By: Ernie Avena M.D.   On: 04/02/2022 13:21    Vassie Loll, MD [facility]  If 7PM-7AM, please contact night-coverage [email or web address] Password  [email or web address] 04/09/2022, 2:35 PM   LOS: 6 days

## 2022-04-09 NOTE — CV Procedure (Signed)
CV Procedure Note  Procedure: Transesophageal echocardiogram Physician: Dr Dina Rich MD Indication: Bacteremia    Patient was brought to the procedure suite after appropriate consent was obtained. The posterior oropharynx was anesthesized with 69mL of 2% viscous lidocaine, patient placed in left lateral decubitus position and bite block placed. Sedation achieved with the assistance of anesthesiology, for details please refer to there records. TEE probe intubated into the esophagus without difficult and several views obtained. For complete findings please refer to full TEE report. No evidence of vegetations or endocarditis. Cardiopulmonary monitoring was performed throughout the procedure, he tolerated well without complications   Dina Rich MD

## 2022-04-09 NOTE — Transfer of Care (Signed)
Immediate Anesthesia Transfer of Care Note  Patient: [name]  Procedure(s) Performed: TRANSESOPHAGEAL ECHOCARDIOGRAM (TEE)  Patient Location: PACU  Anesthesia Type:MAC  Level of Consciousness: drowsy and patient cooperative  Airway & Oxygen Therapy: Patient Spontanous Breathing and Patient connected to nasal cannula oxygen  Post-op Assessment: Report given to RN and Post -op Vital signs reviewed and stable  Post vital signs: Reviewed and stable  Last Vitals:  Vitals Value Taken Time  BP 117/60   Temp    Pulse 68   Resp 18   SpO2 99%     Last Pain:  Vitals:   04/09/22 1245  TempSrc:   PainSc: 0-No pain      Patients Stated Pain Goal: 4 (27/03/50 0938)  Complications: No notable events documented.

## 2022-04-09 NOTE — Progress Notes (Signed)
[facility] Progress Note  1 Day Post-Op  Subjective: No acute events overnight. Feeling well this morning and reports no issues post procedure.   Objective: Vital signs in last 24 hours: Temp:  [97.8 F (36.6 C)-98.8 F (37.1 C)] 97.8 F (36.6 C) (01/19 0323) Pulse Rate:  [68-88] 68 (01/19 0323) Resp:  [18-20] 18 (01/19 0323) BP: (99-140)/(59-85) 116/70 (01/19 0323) SpO2:  [97 %-100 %] 98 % (01/19 0323) Last BM Date : 04/06/22  Intake/Output from previous day: 01/18 0701 - 01/19 0700 In: 2505.6 [P.O.:250; I.V.:1872; IV Piggyback:383.6] Out: 2175 [Urine:2175] Intake/Output this shift: No intake/output data recorded.  General appearance: chronically ill-appearing elderly gentleman, cachectic, in no distress Neuro: no focal deficit Incision/Wound: clean, dry, sutures intact, no erythema, induration or fluctuance  Lab Results:  Recent Labs    04/07/22 0402  WBC 7.8  HGB 8.2*  HCT 24.9*  PLT 124*   BMET Recent Labs    04/07/22 0402 04/08/22 0357  NA 136 136  K 4.3 4.6  CL 108 108  CO2 26 23  GLUCOSE 120* 116*  BUN 12 14  CREATININE 0.85 0.88  CALCIUM 7.6* 7.7*   PT/INRStudies/Results: DG Chest Port 1 View  Result Date: 04/08/2022 CLINICAL DATA:  Port removal EXAM: PORTABLE CHEST 1 VIEW COMPARISON:  None Available. FINDINGS: Stable scarring or consolidation in the left hemithorax with decreased volumes. Right lung clear. No pneumothorax. No definite pleural effusion. Aorta is ectatic. Postop changes epigastric region and right upper quadrant. Left-sided Port-A-Cath has IMPRESSION: Left hemithorax scarring or consolidation and hypoventilation. Otherwise no acute cardiopulmonary process. Electronically Signed   By: Layla Maw M.D.   On: 04/08/2022 10:52    Anti-infectives: Anti-infectives (From admission, onward)    Start     Dose/Rate Route Frequency Ordered Stop   04/08/22 1200  vancomycin (VANCOREADY) IVPB 1250 mg/250 mL        1,250  mg 166.7 mL/hr over 90 Minutes Intravenous Every 24 hours 04/08/22 1055     04/04/22 1900  vancomycin (VANCOREADY) IVPB 750 mg/150 mL  Status:  Discontinued        750 mg 150 mL/hr over 60 Minutes Intravenous Every 24 hours 04/03/22 1927 04/08/22 1055   04/03/22 1815  vancomycin (VANCOCIN) IVPB 1000 mg/200 mL premix        1,000 mg 200 mL/hr over 60 Minutes Intravenous  Once 04/03/22 1808 04/03/22 2016       Assessment/Plan: s/p Procedure(s): MINOR REMOVAL PORT-A-CATH Incision site appropriate, no issues on post op day 1. Catheter tip culture pending, bacteremia being treated per primary team. Chest x-ray without port-a-cath. - Apply antibiotic ointment to incision site daily.    LOS: 6 days    Leisa Gault [facility] 04/09/2022

## 2022-04-09 NOTE — Anesthesia Postprocedure Evaluation (Signed)
Anesthesia Post Note  Patient: [name]  Procedure(s) Performed: TRANSESOPHAGEAL ECHOCARDIOGRAM (TEE)  Patient location during evaluation: PACU Anesthesia Type: General Level of consciousness: awake and alert Pain management: pain level controlled Vital Signs Assessment: post-procedure vital signs reviewed and stable Respiratory status: spontaneous breathing, nonlabored ventilation, respiratory function stable and patient connected to nasal cannula oxygen Cardiovascular status: stable and blood pressure returned to baseline Postop Assessment: no apparent nausea or vomiting Anesthetic complications: no   No notable events documented.   Last Vitals:  Vitals:   04/09/22 1345 04/09/22 1400  BP: 122/74 112/88  Pulse: 69 67  Resp: 20 16  Temp:    SpO2: 99% 97%    Last Pain:  Vitals:   04/09/22 1400  TempSrc:   PainSc: 0-No pain                 Shona Needles

## 2022-04-09 NOTE — Care Management Important Message (Signed)
Important Message  Patient Details  Name: [name] MRN: [id number] Date of Birth: 06-18-1950   Medicare Important Message Given:  Yes (copy left on bedside table)     Corey Harold 04/09/2022, 1:51 PM

## 2022-04-09 NOTE — Progress Notes (Addendum)
PHARMACY CONSULT NOTE FOR:  OUTPATIENT  PARENTERAL ANTIBIOTIC THERAPY (OPAT)  Indication: MRSE bacteremia / infected port Regimen: daptomycin 450mg  IV q24h End date: 04/22/2022  IV antibiotic discharge orders are pended. To discharging provider:  please sign these orders via discharge navigator,  Select New Orders & click on the button choice - Manage This Unsigned Work.     Thank you for allowing pharmacy to be a part of this patient's care.  06/21/2022 04/09/2022, 3:25 PM

## 2022-04-09 NOTE — Progress Notes (Signed)
*  PRELIMINARY RESULTS* Echocardiogram Echocardiogram Transesophageal has been performed.  Carolyne Fiscal 04/09/2022, 2:17 PM

## 2022-04-09 NOTE — Progress Notes (Addendum)
ID brief note:  TEE negative for veg Port pulled on 04/08/22 If blood Cx from 1/18 negative x 48h then Place PICC and plan on abx x 2 weeks EOT 04/22/21. I will place order for repeat blood Cx today and port pulled yesterday, prior to blood Cx ID will sign off  OPAT ORDERS:  Diagnosis: MRSE bacteremia 2/2 port  Culture Result: MRSE  No Known Allergies   Discharge antibiotics to be given via PICC line:  Per pharmacy protocol Daptomycin 8 mg/kg/day    Duration: 2 weeks End Date: 04/22/22  [facility] Care Per Protocol with Biopatch Use: Home health RN for IV administration and teaching, line care and labs.    Labs weekly while on IV antibiotics: _x_ CBC with differential x__ CMP _x_ CRP _x_ ESR __x CK  _x_ Please pull PIC at completion of IV   Fax weekly labs to [phone]  Clinic Follow Up Appt: 1/31  @ [facility] with Dr. Thedore Mins

## 2022-04-10 ENCOUNTER — Other Ambulatory Visit: Payer: Self-pay | Admitting: Hematology

## 2022-04-10 ENCOUNTER — Inpatient Hospital Stay: Payer: Self-pay

## 2022-04-10 DIAGNOSIS — R7881 Bacteremia: Secondary | ICD-10-CM | POA: Diagnosis not present

## 2022-04-10 DIAGNOSIS — E785 Hyperlipidemia, unspecified: Secondary | ICD-10-CM | POA: Diagnosis not present

## 2022-04-10 DIAGNOSIS — Z86711 Personal history of pulmonary embolism: Secondary | ICD-10-CM | POA: Diagnosis not present

## 2022-04-10 DIAGNOSIS — D61810 Antineoplastic chemotherapy induced pancytopenia: Secondary | ICD-10-CM | POA: Diagnosis not present

## 2022-04-10 LAB — COMPREHENSIVE METABOLIC PANEL
ALT: 35 U/L (ref 0–44)
AST: 26 U/L (ref 15–41)
Albumin: 1.6 g/dL — ABNORMAL LOW (ref 3.5–5.0)
Alkaline Phosphatase: 79 U/L (ref 38–126)
Anion gap: 7 (ref 5–15)
BUN: 12 mg/dL (ref 8–23)
CO2: 17 mmol/L — ABNORMAL LOW (ref 22–32)
Calcium: 7.3 mg/dL — ABNORMAL LOW (ref 8.9–10.3)
Chloride: 114 mmol/L — ABNORMAL HIGH (ref 98–111)
Creatinine, Ser: 1.18 mg/dL (ref 0.61–1.24)
GFR, Estimated: >60 mL/min (ref >60)
Glucose, Bld: 103 mg/dL — ABNORMAL HIGH (ref 70–99)
Potassium: 3.8 mmol/L (ref 3.5–5.1)
Sodium: 138 mmol/L (ref 135–145)
Total Bilirubin: 0.1 mg/dL — ABNORMAL LOW (ref 0.3–1.2)
Total Protein: 5 g/dL — ABNORMAL LOW (ref 6.5–8.1)

## 2022-04-10 LAB — TRIGLYCERIDES: Triglycerides: 49 mg/dL (ref <150)

## 2022-04-10 LAB — CK: Total CK: 18 U/L — ABNORMAL LOW (ref 49–397)

## 2022-04-10 LAB — GLUCOSE, CAPILLARY
Glucose-Capillary: 111 mg/dL — ABNORMAL HIGH (ref 70–99)
Glucose-Capillary: 145 mg/dL — ABNORMAL HIGH (ref 70–99)
Glucose-Capillary: 93 mg/dL (ref 70–99)

## 2022-04-10 LAB — MAGNESIUM: Magnesium: 1.7 mg/dL (ref 1.7–2.4)

## 2022-04-10 LAB — PHOSPHORUS: Phosphorus: 2.7 mg/dL (ref 2.5–4.6)

## 2022-04-10 MED ORDER — SODIUM CHLORIDE 0.9% FLUSH
10.0000 mL | Freq: Two times a day (BID) | INTRAVENOUS | Status: DC
  Administered 2022-04-10 – 2022-04-11 (×3): 20 mL
  Administered 2022-04-12 – 2022-04-13 (×3): 10 mL

## 2022-04-10 MED ORDER — SODIUM CHLORIDE 0.9% FLUSH: 10.0000 mL | INTRAVENOUS | Status: DC | PRN

## 2022-04-10 NOTE — Progress Notes (Signed)
Peripherally Inserted Central Catheter Placement  The IV Nurse has discussed with the patient and/or persons authorized to consent for the patient, the purpose of this procedure and the potential benefits and risks involved with this procedure.  The benefits include less needle sticks, lab draws from the catheter, and the patient may be discharged home with the catheter. Risks include, but not limited to, infection, bleeding, blood clot (thrombus formation), and puncture of an artery; nerve damage and irregular heartbeat and possibility to perform a PICC exchange if needed/ordered by physician.  Alternatives to this procedure were also discussed.  Bard Power PICC patient education guide, fact sheet on infection prevention and patient information card has been provided to patient /or left at bedside.    PICC Placement Documentation  PICC Double Lumen 04/10/22 Right Brachial 36 cm 0 cm (Active)  Indication for Insertion or Continuance of Line Administration of hyperosmolar/irritating solutions (i.e. TPN, Vancomycin, etc.);Home intravenous therapies (PICC only) 04/10/22 1750  Exposed Catheter (cm) 0 cm 04/10/22 1750  Site Assessment Clean, Dry, Intact 04/10/22 1750  Lumen #1 Status Saline locked;Flushed;Blood return noted 04/10/22 1750  Lumen #2 Status Saline locked;Flushed;Blood return noted 04/10/22 1750  Dressing Type Transparent;Securing device 04/10/22 1750  Dressing Status Antimicrobial disc in place;Clean, Dry, Intact 04/10/22 1750  Safety Lock Not Applicable 04/10/22 1750  Line Care Connections checked and tightened 04/10/22 1750  Line Adjustment (NICU/IV Team Only) No 04/10/22 1750  Dressing Intervention New dressing 04/10/22 1750  Dressing Change Due 04/17/22 04/10/22 1750       Elliot Dally 04/10/2022, 6:02 PM

## 2022-04-10 NOTE — Progress Notes (Signed)
PROGRESS NOTE  [name]:[id number] DOB: 1950-04-24 DOA: 04/03/2022 PCP: Romeo Rabon, MD  Brief History:  72 year old male with a history of pancreatic adenocarcinoma, colon adenocarcinoma status post resection May 2012, adenocarcinoma of the left lung status post resection, small bowel resection status post ileorectal anastomosis with short gut syndrome, iron deficiency anemia, pulmonary embolism on apixaban, hypertension, CKD stage III, GERD, remote tobacco and alcohol use (quit 2012) presenting with fevers, chills, and rigors of 5 days duration.  The patient initially visited the emergency department on 04/02/2021 with fevers, chills, and nausea.  Blood cultures were obtained during that visit.  He was given ceftriaxone, azithromycin, and IV fluids.  His BP improved and tachycardia improved and he was discharged home in stable condition.  He was called back to come to the emergency department because of positive blood cultures.  He continued to have nausea, fevers, chills.  He complains of generalized weakness and shortness of breath.  He denies any current emesis.  He states that he has chronic diarrhea, 4-5 bowel movements daily.  He denies hematochezia but has occasional melena.  He denies any headache, neck pain, coughing, hemoptysis. Notably, the patient was recently admitted to the hospital from 01/08/2022 to 01/11/2022 due to symptomatic anemia.  He was given 2 unit PRBC.  He will underwent EGD on 01/10/2022 which showed grade a reflux esophagitis without bleeding.  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  01/11/2022 flex sig showed patent side-to-side ileal colonic anastomosis with healthy-appearing mucosa.  There was diffuse moderate inflammation at the colonic anastomosis.  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  His apixaban was restarted on 01/11/2022. In the ED, the patient had a low-grade temperature of 99.8 F.  His blood pressure was soft  with systolic blood pressures in the 100s.  Oxygen saturation was 99% on room air.  WBC 4.2, hemoglobin 10.7, platelets 24,000.  Sodium 130, potassium 3.0, bicarbonate 22, serum creatinine 1.09.  Blood cultures are showing gram-positive cocci in 2 out of 2 sets.  The patient was started on vancomycin.   Assessment/Plan: Bacteremia-CoNS -04/02/2022 blood culture--S epi and S hominis 2 out of 2 sets - 04/03/22 blood culture--S epi and S hominis -04/04/22 TTE--EF 60-65%, no vegetation -04/05/22 blood culture>>GPC -1/14 Echo EF 60-65%, G1DD, no vegetations, normal RVF -ID eval appreciated; recommending>> status post Port-A-Cath removal.  Catheter tip culture requested. -Repeat blood cultures -TEE 04/09/2022: Demonstrating no vegetations or seeding. -Following ID recommendations plan will be for daptomycin for 2 weeks; OPAT in place. -Repeat blood culture from 04/08/2022 has remained without any growth; patient is afebrile and will place PICC line.  Symptomatic anemia/melena -Once again, the patient's hemoglobin has drifted down from 11.6 on 03/17/22 -hemoglobin trend 7.7>>7.4>> latest 8.2 -GI consult appreciated>> status post endoscopy evaluation; mild erosive reflux esophagitis appreciated.  Surgical [place] stomach consistent with prior surgical history.  Small bowel at least to the proximal jejunum appear normal.  Losing mucosa at the anastomosis appreciated (status post epinephrine injection and APC).  Given patient history of pancreatic cancer unable to rule out an element of Hemosuccus pancreaticus. -Patient received transfusion of 1 unit PRBC on 04/04/22 -Continue to follow hemoglobin trend, further transfuse as needed.  (Transition threshold less than 7).. -Hemoglobin has remained stable; no overt bleeding appreciated.  Hyponatremia -Secondary to poor solute intake and volume depletion -Continue IV normal saline -Electrolytes trend.   Hypokalemia -Continue to follow electrolytes and further  replete as needed -Magnesium within normal limits.   Hypophosphatemia -repleted  -Continue to follow electrolytes trend and further replete as needed.   Pancreatic adenocarcinoma -Status post FOLFIRINOX 10/16/2021>> 12/30/2021 -Patient was started on TPN 03/19/2022 to improve his nutritional status for pancreatic tail resection -Pancreatic tail resection and splenectomy has been put on hold secondary to his poor nutritional status. -Continue outpatient follow-up with oncology service.   Iron deficiency anemia -04/20/2022 iron saturation 50 -Follow hemoglobin trend and transfuse as needed.   Pancytopenia due to antineoplastic chemotherapy (HCC) -Last FOLFIRINOX 01/01/22 -Continue to follow serial CBC -Anticipated lower hemoglobin level in the setting of acute bleed.   CKD 3a -baseline creatinine 1.0-1.3 -Appears to be stable and at baseline currently -Continue to maintain adequate hydration.   Chronic abdominal pain -Patient has chronic left-sided abdominal pain -Previously attributable to the patient's malignancy, unchanged in the past month -01/06/2022 MRI abdomen--area of restricted diffusion in the distal pancreas 2.0 x 1.9 cm.  slightly smaller mass in the splenic hilum -Continue as needed analgesics.  Overall well-controlled.   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   HTN (hypertension) -No longer take carvedilol; blood pressure overall stable. -Heart healthy discussed with patient. -Continue to follow vital signs.   Personal history of PE (pulmonary embolism) -Patient was taking apixaban prior to admission -Continue to hold apixaban given acute GI bleed concerns.   -Planning to resume anticoagulation once cleared by GI. -Patient has remained without overt bleeding at this point. -Continue to follow hemoglobin trend.    Family Communication:  spouse updated at bedside  (04/09/2022).   Consultants:  GI, ID   Code Status:  FULL    DVT Prophylaxis:  SCDs     Procedures: As Listed in Progress Note Above   Antibiotics: Vanco 04/03/22>>   Subjective: No fever, no chest pain, no nausea, no vomiting.  No overt bleeding appreciated.  Patient tolerated well TEE which demonstrated no vegetations or seeding.  Objective: Vitals:   04/09/22 1436 04/09/22 2015 04/10/22 0513 04/10/22 1307  BP: 133/71 112/70 (!) 92/55 113/69  Pulse: 67 86 82 94  Resp: 16 18 16    Temp: 98.4 F (36.9 C) 98.6 F (37 C) 98.7 F (37.1 C) 98.6 F (37 C)  TempSrc: Oral Oral Oral   SpO2: 98% 98% 99% 97%  Weight:      Height:        Intake/Output Summary (Last 24 hours) at 04/10/2022 1311 Last data filed at 04/10/2022 0900 Gross per 24 hour  Intake 988.79 ml  Output 250 ml  Net 738.79 ml   Weight change:   Exam: General exam: Alert, awake, oriented x 3;, no chest pain, no nausea, no vomiting, no upper bleeding.  Good saturation on room air.  In no acute distress. Respiratory system: Clear to auscultation. Respiratory effort normal.  No using accessory muscles. Cardiovascular system:RRR. No rubs or gallops; no JVD. Gastrointestinal system: Abdomen is nondistended, soft and nontender. No organomegaly or masses felt. Normal bowel sounds heard. Central nervous system: Alert and oriented. No focal neurological deficits. Extremities: No cyanosis or clubbing; no edema appreciated on his extremities. Skin: No petechiae. Psychiatry: Judgement and insight appear normal. Mood & affect appropriate.   Data Reviewed: I have personally reviewed following labs and imaging studies  Basic Metabolic Panel: Recent Labs  Lab 04/04/22 0456 04/05/22 0524 04/06/22 0524 04/07/22 0402 04/08/22 0357 04/10/22 0805  NA 135 136 134* 136 136 138  K 3.0* 4.2 4.3 4.3 4.6 3.8  CL 107 109 106 108 108 114*  CO2 20* 20* 23 26 23  17*  GLUCOSE 135* 123* 118* 120* 116* 103*  BUN 14 11 11 12 14 12    CREATININE 1.01 0.98 1.03 0.85 0.88 1.18  CALCIUM 7.1* 7.1* 7.2* 7.6* 7.7* 7.3*  MG 2.4 2.1 1.7 2.2 1.7 1.7  PHOS 2.7 2.1* 3.1  --  4.4 2.7   Liver Function Tests: Recent Labs  Lab 04/04/22 0456 04/05/22 0524 04/08/22 0357 04/10/22 0805  AST 27 19 55* 26  ALT 24 20 49* 35  ALKPHOS 73 69 87 79  BILITOT 0.2* 0.3 0.3 0.1*  PROT 4.5* 4.2* 4.8* 5.0*  ALBUMIN 1.6* 1.5* 1.5* 1.6*   Coagulation Profile: No results for input(s): "INR", "PROTIME" in the last 168 hours.  CBC: Recent Labs  Lab 04/03/22 1623 04/04/22 0456 04/05/22 0524 04/06/22 0524 04/07/22 0402  WBC 4.2 4.4 5.5 7.9 7.8  NEUTROABS 3.2  --   --   --   --   HGB 7.7* 7.7* 7.4* 8.2* 8.2*  HCT 23.3* 23.3* 22.6* 24.7* 24.9*  MCV 92.8 92.8 93.8 93.2 92.2  PLT 74* 69* 71* 96* 124*   CBG: Recent Labs  Lab 04/08/22 1630 04/08/22 2154 04/09/22 1725 04/09/22 2347 04/10/22 1121  GLUCAP 106* 112* 229* 74 111*   Urine analysis:    Component Value Date/Time   COLORURINE YELLOW 04/02/2022 1533   APPEARANCEUR HAZY (A) 04/02/2022 1533   LABSPEC 1.013 04/02/2022 1533   PHURINE 5.0 04/02/2022 1533   GLUCOSEU NEGATIVE 04/02/2022 1533   HGBUR NEGATIVE 04/02/2022 1533   BILIRUBINUR NEGATIVE 04/02/2022 1533   KETONESUR NEGATIVE 04/02/2022 1533   PROTEINUR 30 (A) 04/02/2022 1533   UROBILINOGEN 0.2 11/30/2014 1115   NITRITE NEGATIVE 04/02/2022 1533   LEUKOCYTESUR NEGATIVE 04/02/2022 1533   Sepsis Labs:  Recent Results (from the past 240 hour(s))  Resp panel by RT-PCR (RSV, Flu A&B, Covid) Anterior Nasal Swab     Status: None   Collection Time: 04/02/22 12:36 PM   Specimen: Anterior Nasal Swab  Result Value Ref Range Status   SARS Coronavirus 2 by RT PCR NEGATIVE NEGATIVE Final    Comment: (NOTE) SARS-CoV-2 target nucleic acids are NOT DETECTED.  The SARS-CoV-2 RNA is generally detectable in upper respiratory specimens during the acute phase of infection. The lowest concentration of SARS-CoV-2 viral copies this  assay can detect is 138 copies/mL. A negative result does not preclude SARS-Cov-2 infection and should not be used as the sole basis for treatment or other patient management decisions. A negative result may occur with  improper specimen collection/handling, submission of specimen other than nasopharyngeal swab, presence of viral mutation(s) within the areas targeted by this assay, and inadequate number of viral copies(<138 copies/mL). A negative result must be combined with clinical observations, patient history, and epidemiological information. The expected result is Negative.  Fact Sheet for Patients:  06/01/2022  Fact Sheet for Healthcare Providers:  06/01/22  This test is no t yet approved or cleared by the [employer] FDA and  has been authorized for detection and/or diagnosis of SARS-CoV-2 by FDA under an Emergency Use Authorization (EUA). This EUA will remain  in effect (meaning this test can be used) for the duration of the COVID-19 declaration under Section 564(b)(1) of the Act, 21 U.S.C.section 360bbb-3(b)(1), unless the authorization is terminated  or revoked sooner.       Influenza A by PCR NEGATIVE NEGATIVE Final  Influenza B by PCR NEGATIVE NEGATIVE Final    Comment: (NOTE) The Xpert Xpress SARS-CoV-2/FLU/RSV plus assay is intended as an aid in the diagnosis of influenza from Nasopharyngeal swab specimens and should not be used as a sole basis for treatment. Nasal washings and aspirates are unacceptable for Xpert Xpress SARS-CoV-2/FLU/RSV testing.  Fact Sheet for Patients: [employer]  Fact Sheet for Healthcare Providers: [employer]  This test is not yet approved or cleared by the [place] FDA and has been authorized for detection and/or diagnosis of SARS-CoV-2 by FDA under an Emergency Use Authorization (EUA). This EUA will  remain in effect (meaning this test can be used) for the duration of the COVID-19 declaration under Section 564(b)(1) of the Act, 21 U.S.C. section 360bbb-3(b)(1), unless the authorization is terminated or revoked.     Resp Syncytial Virus by PCR NEGATIVE NEGATIVE Final    Comment: (NOTE) Fact Sheet for Patients: [employer]  Fact Sheet for Healthcare Providers: Serious[employer]ker.it  This test is not yet approved or cleared by the [place] FDA and has been authorized for detection and/or diagnosis of SARS-CoV-2 by FDA under an Emergency Use Authorization (EUA). This EUA will remain in effect (meaning this test can be used) for the duration of the COVID-19 declaration under Section 564(b)(1) of the Act, 21 U.S.C. section 360bbb-3(b)(1), unless the authorization is terminated or revoked.  Performed at [facility], [address], [place], [place] [address]   Culture, blood (Routine x 2)     Status: Abnormal   Collection Time: 04/02/22  2:01 PM   Specimen: BLOOD  Result Value Ref Range Status   Specimen Description BLOOD LEFT ANTECUBITAL  Final   Special Requests   Final    BOTTLES DRAWN AEROBIC AND ANAEROBIC Blood Culture adequate volume   Culture  Setup Time   Final    GRAM POSITIVE COCCI ANAEROBIC BOTTLE ONLY Gram Stain Report Called to,Read Back By and Verified With: WHITE M @ 0838 ON [id number] BY HENDERSON L GRAM POSITIVE COCCI AEROBIC BOTTLE ONLY CRITICAL RESULT CALLED TO, READ BACK BY AND VERIFIED WITH:  C/ M. WHITE, RN 04/03/22 1400 A. LAFRANCE Performed at [facility], [address], [place], [place] [address]    Culture (A)  Final    STAPHYLOCOCCUS EPIDERMIDIS STAPHYLOCOCCUS HOMINIS    Report Status 04/06/2022 FINAL  Final   Organism ID, Bacteria STAPHYLOCOCCUS EPIDERMIDIS  Final   Organism ID, Bacteria STAPHYLOCOCCUS HOMINIS  Final      Susceptibility   Staphylococcus epidermidis - MIC*     CIPROFLOXACIN <=0.5 SENSITIVE Sensitive     ERYTHROMYCIN >=8 RESISTANT Resistant     GENTAMICIN <=0.5 SENSITIVE Sensitive     OXACILLIN >=4 RESISTANT Resistant     TETRACYCLINE 2 SENSITIVE Sensitive     VANCOMYCIN 1 SENSITIVE Sensitive     TRIMETH/SULFA <=10 SENSITIVE Sensitive     CLINDAMYCIN <=0.25 SENSITIVE Sensitive     RIFAMPIN <=0.5 SENSITIVE Sensitive     Inducible Clindamycin NEGATIVE Sensitive     * STAPHYLOCOCCUS EPIDERMIDIS   Staphylococcus hominis - MIC*    CIPROFLOXACIN <=0.5 SENSITIVE Sensitive     ERYTHROMYCIN >=8 RESISTANT Resistant     GENTAMICIN <=0.5 SENSITIVE Sensitive     OXACILLIN >=4 RESISTANT Resistant     TETRACYCLINE >=16 RESISTANT Resistant     VANCOMYCIN <=0.5 SENSITIVE Sensitive     TRIMETH/SULFA <=10 SENSITIVE Sensitive     CLINDAMYCIN <=0.25 SENSITIVE Sensitive     RIFAMPIN <=0.5 SENSITIVE Sensitive  Inducible Clindamycin NEGATIVE Sensitive     * STAPHYLOCOCCUS HOMINIS  Culture, blood (Routine x 2)     Status: Abnormal   Collection Time: 04/02/22  2:01 PM   Specimen: BLOOD LEFT HAND  Result Value Ref Range Status   Specimen Description   Final    BLOOD LEFT HAND BOTTLES DRAWN AEROBIC ONLY Performed at [facility], [address], [place], [place] [address]    Special Requests   Final    Blood Culture adequate volume Performed at [facility], [address] [place], [place] [address]    Culture  Setup Time   Final    GRAM POSITIVE COCCI AEROBIC BOTTLE ONLY Gram Stain Report Called to,Read Back By and Verified With: WHITE M @ 1334 ON [id number] BY HENDERSON L CRITICAL VALUE NOTED.  VALUE IS CONSISTENT WITH PREVIOUSLY REPORTED AND CALLED VALUE.    Culture (A)  Final    STAPHYLOCOCCUS EPIDERMIDIS STAPHYLOCOCCUS HOMINIS SUSCEPTIBILITIES PERFORMED ON PREVIOUS CULTURE WITHIN THE LAST 5 DAYS. Performed at [facility], [address] [place], [place] [address]    Report Status 04/06/2022 FINAL  Final  Blood Culture ID Panel (Reflexed)      Status: Abnormal   Collection Time: 04/02/22  2:01 PM  Result Value Ref Range Status   Enterococcus faecalis NOT DETECTED NOT DETECTED Final   Enterococcus Faecium NOT DETECTED NOT DETECTED Final   Listeria monocytogenes NOT DETECTED NOT DETECTED Final   Staphylococcus species DETECTED (A) NOT DETECTED Final    Comment: CRITICAL RESULT CALLED TO, READ BACK BY AND VERIFIED WITH:  C/ M. WHITE, RN 04/03/22 1400 A. LAFRANCE    Staphylococcus aureus (BCID) NOT DETECTED NOT DETECTED Final   Staphylococcus epidermidis DETECTED (A) NOT DETECTED Final    Comment: Methicillin (oxacillin) resistant coagulase negative staphylococcus. Possible blood culture contaminant (unless isolated from more than one blood culture draw or clinical case suggests pathogenicity). No antibiotic treatment is indicated for blood  culture contaminants. CRITICAL RESULT CALLED TO, READ BACK BY AND VERIFIED WITH:  C/ M. WHITE, RN 04/03/22 1400 A. LAFRANCE    Staphylococcus lugdunensis NOT DETECTED NOT DETECTED Final   Streptococcus species NOT DETECTED NOT DETECTED Final   Streptococcus agalactiae NOT DETECTED NOT DETECTED Final   Streptococcus pneumoniae NOT DETECTED NOT DETECTED Final   Streptococcus pyogenes NOT DETECTED NOT DETECTED Final   A.calcoaceticus-baumannii NOT DETECTED NOT DETECTED Final   Bacteroides fragilis NOT DETECTED NOT DETECTED Final   Enterobacterales NOT DETECTED NOT DETECTED Final   Enterobacter cloacae complex NOT DETECTED NOT DETECTED Final   Escherichia coli NOT DETECTED NOT DETECTED Final   Klebsiella aerogenes NOT DETECTED NOT DETECTED Final   Klebsiella oxytoca NOT DETECTED NOT DETECTED Final   Klebsiella pneumoniae NOT DETECTED NOT DETECTED Final   Proteus species NOT DETECTED NOT DETECTED Final   Salmonella species NOT DETECTED NOT DETECTED Final   Serratia marcescens NOT DETECTED NOT DETECTED Final   Haemophilus influenzae NOT DETECTED NOT DETECTED Final   Neisseria meningitidis NOT  DETECTED NOT DETECTED Final   Pseudomonas aeruginosa NOT DETECTED NOT DETECTED Final   Stenotrophomonas maltophilia NOT DETECTED NOT DETECTED Final   Candida albicans NOT DETECTED NOT DETECTED Final   Candida auris NOT DETECTED NOT DETECTED Final   Candida glabrata NOT DETECTED NOT DETECTED Final   Candida krusei NOT DETECTED NOT DETECTED Final   Candida parapsilosis NOT DETECTED NOT DETECTED Final   Candida tropicalis NOT DETECTED NOT DETECTED Final   Cryptococcus neoformans/gattii NOT DETECTED NOT DETECTED  Final   Methicillin resistance mecA/C DETECTED (A) NOT DETECTED Final    Comment: CRITICAL RESULT CALLED TO, READ BACK BY AND VERIFIED WITH:  C/ M. WHITE, RN 04/03/22 1400 A. LAFRANCE Performed at [facility], [address], [place], [place] [address]   Urine Culture     Status: None   Collection Time: 04/02/22  3:33 PM   Specimen: Urine, Catheterized  Result Value Ref Range Status   Specimen Description   Final    URINE, CATHETERIZED Performed at [facility], [address], [place], [place] [address]    Special Requests   Final    NONE Performed at [facility], [address], [place], [place] [address]    Culture   Final    NO GROWTH Performed at [facility], [address], [place], [place] [address]    Report Status 04/04/2022 FINAL  Final  Blood culture (routine x 2)     Status: Abnormal   Collection Time: 04/03/22  4:23 PM   Specimen: BLOOD  Result Value Ref Range Status   Specimen Description BLOOD LEFT ANTECUBITAL  Final   Special Requests   Final    BOTTLES DRAWN AEROBIC AND ANAEROBIC Blood Culture adequate volume   Culture  Setup Time   Final    GRAM POSITIVE COCCI Gram Stain Report Called to,Read Back By and Verified With: T. Darcel Bayley  @ 0944 BY STEPHTR  04/04/22 IN BOTH AEROBIC AND ANAEROBIC BOTTLES CRITICAL VALUE NOTED.  VALUE IS CONSISTENT WITH PREVIOUSLY REPORTED AND CALLED VALUE. Performed at [facility], [address]  [place], [place] [address]    Culture (A)  Final    STAPHYLOCOCCUS EPIDERMIDIS STAPHYLOCOCCUS HOMINIS STAPHYLOCOCCUS HOMINIS SUSCEPTIBILITIES PERFORMED ON PREVIOUS CULTURE WITHIN THE LAST 5 DAYS.    Report Status 04/07/2022 FINAL  Final   Organism ID, Bacteria STAPHYLOCOCCUS EPIDERMIDIS  Final      Susceptibility   Staphylococcus epidermidis - MIC*    CIPROFLOXACIN <=0.5 SENSITIVE Sensitive     ERYTHROMYCIN >=8 RESISTANT Resistant     GENTAMICIN <=0.5 SENSITIVE Sensitive     OXACILLIN >=4 RESISTANT Resistant     TETRACYCLINE 2 SENSITIVE Sensitive     VANCOMYCIN 2 SENSITIVE Sensitive     TRIMETH/SULFA <=10 SENSITIVE Sensitive     CLINDAMYCIN <=0.25 SENSITIVE Sensitive     RIFAMPIN <=0.5 SENSITIVE Sensitive     Inducible Clindamycin NEGATIVE Sensitive     * STAPHYLOCOCCUS EPIDERMIDIS  Blood culture (routine x 2)     Status: Abnormal   Collection Time: 04/03/22  4:28 PM   Specimen: BLOOD RIGHT HAND  Result Value Ref Range Status   Specimen Description BLOOD RIGHT HAND  Final   Special Requests   Final    BOTTLES DRAWN AEROBIC AND ANAEROBIC Blood Culture adequate volume   Culture  Setup Time   Final    GRAM POSITIVE COCCI Gram Stain Report Called to,Read Back By and Verified With:  T. LEONARD @ 0944 BY STEPHTR 04/04/22 IN BOTH AEROBIC AND ANAEROBIC BOTTLES Organism ID to follow CRITICAL RESULT CALLED TO, READ BACK BY AND VERIFIED WITH:  Perlie Gold, RN 04/04/22 1552 A. LAFRANCE Performed at [facility], [address], [place], [place] [address]    Culture (A)  Final    STAPHYLOCOCCUS EPIDERMIDIS STAPHYLOCOCCUS HOMINIS STAPHYLOCOCCUS HOMINIS SUSCEPTIBILITIES PERFORMED ON PREVIOUS CULTURE WITHIN THE LAST 5 DAYS.    Report Status 04/07/2022 FINAL  Final   Organism ID, Bacteria STAPHYLOCOCCUS EPIDERMIDIS  Final      Susceptibility   Staphylococcus epidermidis - MIC*    CIPROFLOXACIN <=0.5 SENSITIVE Sensitive     ERYTHROMYCIN >=8 RESISTANT Resistant     GENTAMICIN <=0.5  SENSITIVE Sensitive     OXACILLIN >=4 RESISTANT Resistant     TETRACYCLINE 2 SENSITIVE Sensitive     VANCOMYCIN 2 SENSITIVE Sensitive     TRIMETH/SULFA <=10 SENSITIVE Sensitive     CLINDAMYCIN <=0.25 SENSITIVE Sensitive     RIFAMPIN <=0.5 SENSITIVE Sensitive     Inducible Clindamycin NEGATIVE Sensitive     * STAPHYLOCOCCUS EPIDERMIDIS  Blood Culture ID Panel (Reflexed)     Status: Abnormal   Collection Time: 04/03/22  4:28 PM  Result Value Ref Range Status   Enterococcus faecalis NOT DETECTED NOT DETECTED Final   Enterococcus Faecium NOT DETECTED NOT DETECTED Final   Listeria monocytogenes NOT DETECTED NOT DETECTED Final   Staphylococcus species DETECTED (A) NOT DETECTED Final    Comment: CRITICAL RESULT CALLED TO, READ BACK BY AND VERIFIED WITH:  Perlie Gold, RN 04/04/22 1552 A. LAFRANCE    Staphylococcus aureus (BCID) NOT DETECTED NOT DETECTED Final   Staphylococcus epidermidis DETECTED (A) NOT DETECTED Final    Comment: Methicillin (oxacillin) resistant coagulase negative staphylococcus. Possible blood culture contaminant (unless isolated from more than one blood culture draw or clinical case suggests pathogenicity). No antibiotic treatment is indicated for blood  culture contaminants. CRITICAL RESULT CALLED TO, READ BACK BY AND VERIFIED WITH:  Perlie Gold, RN 04/04/22 1552 A. LAFRANCE    Staphylococcus lugdunensis NOT DETECTED NOT DETECTED Final   Streptococcus species NOT DETECTED NOT DETECTED Final   Streptococcus agalactiae NOT DETECTED NOT DETECTED Final   Streptococcus pneumoniae NOT DETECTED NOT DETECTED Final   Streptococcus pyogenes NOT DETECTED NOT DETECTED Final   A.calcoaceticus-baumannii NOT DETECTED NOT DETECTED Final   Bacteroides fragilis NOT DETECTED NOT DETECTED Final   Enterobacterales NOT DETECTED NOT DETECTED Final   Enterobacter cloacae complex NOT DETECTED NOT DETECTED Final   Escherichia coli NOT DETECTED NOT DETECTED Final   Klebsiella aerogenes NOT  DETECTED NOT DETECTED Final   Klebsiella oxytoca NOT DETECTED NOT DETECTED Final   Klebsiella pneumoniae NOT DETECTED NOT DETECTED Final   Proteus species NOT DETECTED NOT DETECTED Final   Salmonella species NOT DETECTED NOT DETECTED Final   Serratia marcescens NOT DETECTED NOT DETECTED Final   Haemophilus influenzae NOT DETECTED NOT DETECTED Final   Neisseria meningitidis NOT DETECTED NOT DETECTED Final   Pseudomonas aeruginosa NOT DETECTED NOT DETECTED Final   Stenotrophomonas maltophilia NOT DETECTED NOT DETECTED Final   Candida albicans NOT DETECTED NOT DETECTED Final   Candida auris NOT DETECTED NOT DETECTED Final   Candida glabrata NOT DETECTED NOT DETECTED Final   Candida krusei NOT DETECTED NOT DETECTED Final   Candida parapsilosis NOT DETECTED NOT DETECTED Final   Candida tropicalis NOT DETECTED NOT DETECTED Final   Cryptococcus neoformans/gattii NOT DETECTED NOT DETECTED Final   Methicillin resistance mecA/C DETECTED (A) NOT DETECTED Final    Comment: CRITICAL RESULT CALLED TO, READ BACK BY AND VERIFIED WITH:  Perlie Gold, RN 04/04/22 1552 A. LAFRANCE Performed at [facility], [address], [place], [place] [address]   Culture, blood (Routine X 2) w Reflex to ID Panel     Status: Abnormal   Collection Time: 04/04/22 10:20 AM   Specimen: BLOOD RIGHT ARM  Result Value Ref Range Status   Specimen Description   Final    BLOOD  RIGHT ARM BOTTLES DRAWN AEROBIC AND ANAEROBIC   Special Requests   Final    Blood Culture results may not be optimal due to an excessive volume of blood received in culture bottles   Culture  Setup Time   Final    GRAM POSITIVE COCCI ANAEROBIC BOTTLE ONLY Gram Stain Report Called to,Read Back By and Verified With: BASS V. AT 1513 ON [id number] BY THOMPSON S. CRITICAL VALUE NOTED.  VALUE IS CONSISTENT WITH PREVIOUSLY REPORTED AND CALLED VALUE.    Culture (A)  Final    STAPHYLOCOCCUS EPIDERMIDIS SUSCEPTIBILITIES PERFORMED ON PREVIOUS CULTURE  WITHIN THE LAST 5 DAYS.    Report Status 04/09/2022 FINAL  Final  Culture, blood (Routine X 2) w Reflex to ID Panel     Status: None   Collection Time: 04/04/22 10:20 AM   Specimen: BLOOD RIGHT HAND  Result Value Ref Range Status   Specimen Description   Final    BLOOD RIGHT HAND BOTTLES DRAWN AEROBIC AND ANAEROBIC   Special Requests   Final    Blood Culture results may not be optimal due to an excessive volume of blood received in culture bottles   Culture   Final    NO GROWTH 5 DAYS Performed at [facility], [address] [place], [place] [address]    Report Status 04/09/2022 FINAL  Final  Gastrointestinal Panel by PCR , Stool     Status: None   Collection Time: 04/04/22  2:24 PM  Result Value Ref Range Status   Campylobacter species NOT DETECTED NOT DETECTED Final   Plesimonas shigelloides NOT DETECTED NOT DETECTED Final   Salmonella species NOT DETECTED NOT DETECTED Final   Yersinia enterocolitica NOT DETECTED NOT DETECTED Final   Vibrio species NOT DETECTED NOT DETECTED Final   Vibrio cholerae NOT DETECTED NOT DETECTED Final   Enteroaggregative E coli (EAEC) NOT DETECTED NOT DETECTED Final   Enteropathogenic E coli (EPEC) NOT DETECTED NOT DETECTED Final   Enterotoxigenic E coli (ETEC) NOT DETECTED NOT DETECTED Final   Shiga like toxin producing E coli (STEC) NOT DETECTED NOT DETECTED Final   Shigella/Enteroinvasive E coli (EIEC) NOT DETECTED NOT DETECTED Final   Cryptosporidium NOT DETECTED NOT DETECTED Final   Cyclospora cayetanensis NOT DETECTED NOT DETECTED Final   Entamoeba histolytica NOT DETECTED NOT DETECTED Final   Giardia lamblia NOT DETECTED NOT DETECTED Final   Adenovirus F40/41 NOT DETECTED NOT DETECTED Final   Astrovirus NOT DETECTED NOT DETECTED Final   Norovirus GI/GII NOT DETECTED NOT DETECTED Final   Rotavirus A NOT DETECTED NOT DETECTED Final   Sapovirus (I, II, IV, and V) NOT DETECTED NOT DETECTED Final    Comment: Performed at [facility],  [address] [place], [place] [address]  Culture, blood (single) w Reflex to ID Panel     Status: Abnormal   Collection Time: 04/05/22  4:30 PM   Specimen: Right Antecubital; Blood  Result Value Ref Range Status   Specimen Description   Final    RIGHT ANTECUBITAL Performed at [facility], [address] [place], [place] [address]    Special Requests   Final    BOTTLES DRAWN AEROBIC AND ANAEROBIC Blood Culture adequate volume Performed at [facility], [address] [place], [place] [address]    Culture  Setup Time   Final    GRAM POSITIVE COCCI Gram Stain Report Called to,Read Back By and Verified With: [email or web address]  BY MATTHEWS, B 1.16.2024 IN BOTH AEROBIC AND ANAEROBIC BOTTLES CRITICAL VALUE NOTED.  VALUE IS CONSISTENT WITH PREVIOUSLY REPORTED AND CALLED VALUE.    Culture (A)  Final    STAPHYLOCOCCUS EPIDERMIDIS SUSCEPTIBILITIES PERFORMED ON PREVIOUS CULTURE WITHIN THE LAST 5 DAYS. Performed at [facility], [address] [place], [place] [address]    Report Status 04/08/2022 FINAL  Final  Culture, blood (Routine X 2) w Reflex to ID Panel     Status: None (Preliminary result)   Collection Time: 04/08/22  9:28 AM   Specimen: BLOOD  Result Value Ref Range Status   Specimen Description BLOOD RIGHT ANTECUBITAL  Final   Special Requests   Final    BOTTLES DRAWN AEROBIC AND ANAEROBIC Blood Culture adequate volume   Culture   Final    NO GROWTH 2 DAYS Performed at [facility], [address] [place], [place] [address]    Report Status PENDING  Incomplete  Culture, blood (Routine X 2) w Reflex to ID Panel     Status: None (Preliminary result)   Collection Time: 04/08/22  9:33 AM   Specimen: BLOOD  Result Value Ref Range Status   Specimen Description BLOOD BLOOD LEFT HAND  Final   Special Requests   Final    BOTTLES DRAWN AEROBIC AND ANAEROBIC Blood Culture adequate volume   Culture   Final    NO GROWTH 2 DAYS Performed at [facility], [address],  [place], [place] [address]    Report Status PENDING  Incomplete  Cath Tip Culture     Status: None (Preliminary result)   Collection Time: 04/08/22 10:10 AM   Specimen: Catheter Tip; Other  Result Value Ref Range Status   Specimen Description   Final    PORTA CATH Performed at [facility], [address], [place], [place] [address]    Special Requests   Final    NONE Performed at [facility], [address] [place], [place] [address]    Culture   Final    CULTURE REINCUBATED FOR BETTER GROWTH Performed at [facility], [address], [place], [place] [address]    Report Status PENDING  Incomplete  Culture, blood (Routine X 2) w Reflex to ID Panel     Status: None (Preliminary result)   Collection Time: 04/09/22  3:52 PM   Specimen: Right Antecubital; Blood  Result Value Ref Range Status   Specimen Description   Final    RIGHT ANTECUBITAL BOTTLES DRAWN AEROBIC AND ANAEROBIC   Special Requests Blood Culture adequate volume  Final   Culture   Final    NO GROWTH < 12 HOURS Performed at [facility], [address], [place], [place] [address]    Report Status PENDING  Incomplete  Culture, blood (Routine X 2) w Reflex to ID Panel     Status: None (Preliminary result)   Collection Time: 04/09/22  3:52 PM   Specimen: BLOOD RIGHT HAND  Result Value Ref Range Status   Specimen Description   Final    BLOOD RIGHT HAND BOTTLES DRAWN AEROBIC AND ANAEROBIC   Special Requests Blood Culture adequate volume  Final   Culture   Final    NO GROWTH < 12 HOURS Performed at [facility], [address] [place], [place] [address]    Report Status PENDING  Incomplete     Scheduled Meds:  acidophilus  1 capsule Oral Daily   atorvastatin  20 mg Oral Daily   bacitracin   Topical Daily   Chlorhexidine Gluconate Cloth  6 each Topical  Daily   cholestyramine light  4 g Oral Daily   dicyclomine  10 mg Oral TID AC & HS   diphenoxylate-atropine  1 tablet Oral QID   escitalopram  20 mg Oral  Daily   feeding supplement  237 mL Oral TID BM   gabapentin  100 mg Oral QHS   lipase/protease/amylase  108,000 Units Oral TID WC   megestrol  400 mg Oral BID   mirtazapine  30 mg Oral QHS   pantoprazole  40 mg Oral BID AC   Continuous Infusions:  DAPTOmycin (CUBICIN) 450 mg in sodium chloride 0.9 % IVPB      Procedures/Studies: [place] EKG SITE RITE  Result Date: 04/10/2022 If Site Rite image not attached, placement could not be confirmed due to current cardiac rhythm.  ECHO TEE  Result Date: 04/09/2022    TRANSESOPHOGEAL ECHO REPORT   Patient Name:   [name] Date of Exam: 04/09/2022 Medical Rec #:  [id number]        Height:       68.0 in Accession #:    [id number]       Weight:       119.9 lb Date of Birth:  15-Feb-1951       BSA:          1.644 m Patient Age:    71 years         BP:           130/80 mmHg Patient Gender: M                HR:           78 bpm. [facility] Procedure: Transesophageal Echo, Color Doppler and Cardiac Doppler Indications:    Bacteremia  History:        Patient has prior history of Echocardiogram examinations, most                 recent 04/04/2022. Signs/Symptoms:Chest Pain; Risk                 Factors:Hypertension and Dyslipidemia. CKD, Colon CA.  Sonographer:    Mikki [name] Referring Phys: [id number] Dorothe Pea BRANCH PROCEDURE: The transesophogeal probe was passed without difficulty through the esophogus of the patient. Sedation performed by different physician. The patient developed no complications during the procedure.  IMPRESSIONS  1. Left ventricular ejection fraction, by estimation, is 60 to 65%. The left ventricle has normal function.  2. Right ventricular systolic function is normal. The right ventricular size is normal.  3. No left atrial/left atrial appendage thrombus was detected. The LAA emptying velocity was 97 cm/s.  4. The mitral valve is normal in structure. No evidence of mitral valve regurgitation. No evidence of mitral  stenosis.  5. The aortic valve is tricuspid. Aortic valve regurgitation is not visualized. No aortic stenosis is present. Conclusion(s)/Recommendation(s): No evidence of vegetation/infective endocarditis on this transesophageael echocardiogram. FINDINGS  Left Ventricle: Left ventricular ejection fraction, by estimation, is 60 to 65%. The left ventricle has normal function. The left ventricular internal cavity size was normal in size. Right Ventricle: The right ventricular size is normal. Right vetricular wall thickness was not well visualized. Right ventricular systolic function is normal. Left Atrium: Left atrial size was normal in size. No left atrial/left atrial appendage thrombus was detected. The LAA emptying velocity was 97 cm/s. Right Atrium: Right atrial size was normal in size. Pericardium: There is no evidence of pericardial effusion. Mitral Valve: The mitral  valve is normal in structure. No evidence of mitral valve regurgitation. No evidence of mitral valve stenosis. Tricuspid Valve: The tricuspid valve is normal in structure. Tricuspid valve regurgitation is mild . No evidence of tricuspid stenosis. Aortic Valve: The aortic valve is tricuspid. Aortic valve regurgitation is not visualized. No aortic stenosis is present. Pulmonic Valve: The pulmonic valve was not well visualized. Pulmonic valve regurgitation is not visualized. No evidence of pulmonic stenosis. Aorta: The aortic root is normal in size and structure. IAS/Shunts: No atrial level shunt detected by color flow Doppler.   AORTA Ao Root diam: 3.50 cm Dina Rich MD Electronically signed by Dina Rich MD Signature Date/Time: 04/09/2022/2:49:02 PM    Final    DG Chest Port 1 View  Result Date: 04/08/2022 CLINICAL DATA:  Port removal EXAM: PORTABLE CHEST 1 VIEW COMPARISON:  None Available. FINDINGS: Stable scarring or consolidation in the left hemithorax with decreased volumes. Right lung clear. No pneumothorax. No definite pleural  effusion. Aorta is ectatic. Postop changes epigastric region and right upper quadrant. Left-sided Port-A-Cath has IMPRESSION: Left hemithorax scarring or consolidation and hypoventilation. Otherwise no acute cardiopulmonary process. Electronically Signed   By: Layla Maw M.D.   On: 04/08/2022 10:52   ECHOCARDIOGRAM COMPLETE  Result Date: 04/04/2022    ECHOCARDIOGRAM REPORT   Patient Name:   [name] Date of Exam: 04/04/2022 Medical Rec #:  [id number]        Height:       68.0 in Accession #:    [id number]       Weight:       120.0 lb Date of Birth:  October 19, 1950       BSA:          1.645 m Patient Age:    71 years         BP:           95/46 mmHg Patient Gender: M                HR:           91 bpm. Exam Location:  Inpatient Procedure: 2D Echo, Cardiac Doppler and Color Doppler Indications:    Bacteremia  History:        Patient has prior history of Echocardiogram examinations, most                 recent 03/20/2016. Signs/Symptoms:Fever; Risk                 Factors:Hypertension. Cancer. Pulmonary embolus 2012.  Sonographer:    Sheralyn Boatman RDCS Referring Phys: [phone] DAVID TAT IMPRESSIONS  1. Left ventricular ejection fraction, by estimation, is 60 to 65%. Left ventricular ejection fraction by 2D MOD biplane is 60.6 %. The left ventricle has normal function. The left ventricle has no regional wall motion abnormalities. Left ventricular diastolic parameters are consistent with Grade I diastolic dysfunction (impaired relaxation).  2. Right ventricular systolic function is normal. The right ventricular size is normal. There is mildly elevated pulmonary artery systolic pressure. The estimated right ventricular systolic pressure is 43.2 mmHg.  3. The mitral valve is grossly normal. Trivial mitral valve regurgitation.  4. The aortic valve is tricuspid. Aortic valve regurgitation is not visualized.  5. The inferior vena cava is normal in size with greater than 50% respiratory variability, suggesting right  atrial pressure of 3 mmHg. Conclusion(s)/Recommendation(s): No evidence of valvular vegetations on this transthoracic echocardiogram. Consider a transesophageal echocardiogram to exclude infective endocarditis if clinically indicated.  FINDINGS  Left Ventricle: Left ventricular ejection fraction, by estimation, is 60 to 65%. Left ventricular ejection fraction by 2D MOD biplane is 60.6 %. The left ventricle has normal function. The left ventricle has no regional wall motion abnormalities. The left ventricular internal cavity size was normal in size. There is no left ventricular hypertrophy. Left ventricular diastolic parameters are consistent with Grade I diastolic dysfunction (impaired relaxation). Indeterminate filling pressures. Right Ventricle: The right ventricular size is normal. No increase in right ventricular wall thickness. Right ventricular systolic function is normal. There is mildly elevated pulmonary artery systolic pressure. The tricuspid regurgitant velocity is 3.17  m/s, and with an assumed right atrial pressure of 3 mmHg, the estimated right ventricular systolic pressure is 43.2 mmHg. Left Atrium: Left atrial size was normal in size. Right Atrium: Right atrial size was normal in size. Pericardium: There is no evidence of pericardial effusion. Mitral Valve: The mitral valve is grossly normal. Trivial mitral valve regurgitation. Tricuspid Valve: The tricuspid valve is grossly normal. Tricuspid valve regurgitation is mild. Aortic Valve: The aortic valve is tricuspid. Aortic valve regurgitation is not visualized. Pulmonic Valve: The pulmonic valve was normal in structure. Pulmonic valve regurgitation is not visualized. Aorta: The aortic root and ascending aorta are structurally normal, with no evidence of dilitation. Venous: The inferior vena cava is normal in size with greater than 50% respiratory variability, suggesting right atrial pressure of 3 mmHg. IAS/Shunts: No atrial level shunt detected by  color flow Doppler.  LEFT VENTRICLE PLAX 2D                        Biplane EF (MOD) LVIDd:         3.80 cm         LV Biplane EF:   Left LVIDs:         2.40 cm                          ventricular LV PW:         1.10 cm                          ejection LV IVS:        0.80 cm                          fraction by LVOT diam:     2.30 cm                          2D MOD LV SV:         90                               biplane is LV SV Index:   55                               60.6 %. LVOT Area:     4.15 cm                                Diastology  LV e' medial:    8.70 cm/s LV Volumes (MOD)               LV E/e' medial:  13.4 LV vol d, MOD    96.3 ml       LV e' lateral:   11.90 cm/s A2C:                           LV E/e' lateral: 9.8 LV vol d, MOD    90.4 ml A4C: LV vol s, MOD    41.7 ml A2C: LV vol s, MOD    38.0 ml A4C: LV SV MOD A2C:   54.6 ml LV SV MOD A4C:   90.4 ml LV SV MOD BP:    62.1 ml RIGHT VENTRICLE             IVC RV S prime:     16.00 cm/s  IVC diam: 1.80 cm TAPSE (M-mode): 1.8 cm LEFT ATRIUM             Index        RIGHT ATRIUM           Index LA diam:        3.60 cm 2.19 cm/m   RA Area:     13.50 cm LA Vol (A2C):   69.4 ml 42.19 ml/m  RA Volume:   36.00 ml  21.89 ml/m LA Vol (A4C):   27.6 ml 16.78 ml/m LA Biplane Vol: 46.0 ml 27.97 ml/m  AORTIC VALVE LVOT Vmax:   135.00 cm/s LVOT Vmean:  90.700 cm/s LVOT VTI:    0.216 m  AORTA Ao Root diam: 3.10 cm Ao Asc diam:  3.60 cm MITRAL VALVE                TRICUSPID VALVE MV Area (PHT): 4.15 cm     TR Peak grad:   40.2 mmHg MV Decel Time: 183 msec     TR Vmax:        317.00 cm/s MV E velocity: 116.67 cm/s MV A velocity: 96.57 cm/s   SHUNTS MV E/A ratio:  1.21         Systemic VTI:  0.22 m                             Systemic Diam: 2.30 cm Zoila Shutter MD Electronically signed by Zoila Shutter MD Signature Date/Time: 04/04/2022/5:27:58 PM    Final    DG Chest Port 1 View  Result Date: 04/02/2022 CLINICAL DATA:  Possible  sepsis EXAM: PORTABLE CHEST 1 VIEW COMPARISON:  01/08/2022 FINDINGS: Cardiac size is within normal limits. There is decreased volume in the left lung. Linear densities are seen in left mid and left lower lung fields. Surgical staples are seen in left parahilar region. Left hemidiaphragm is elevated. There is blunting of left lateral CP angle. There is no pneumothorax. Tip of left subclavian chest port is seen in superior vena cava. IMPRESSION: Postsurgical changes are noted in left hemithorax. There are no new infiltrates or signs of pulmonary edema. Electronically Signed   By: Ernie Avena M.D.   On: 04/02/2022 13:21    Vassie Loll, MD [facility]  If 7PM-7AM, please contact night-coverage [email or web address] Password [email or web address] 04/10/2022, 1:11 PM   LOS: 7 days

## 2022-04-10 NOTE — TOC Progression Note (Signed)
Transition of Care [facility]) - Progression Note    Patient Details  Name: [name] MRN: [id number] Date of Birth: 1950/09/13  Transition of Care [facility]) CM/SW Contact  Leitha Bleak, RN Phone Number: 04/10/2022, 1:57 PM  Clinical Narrative:   DC planning for Monday. Updated Pam with [facility], She will update [facility]. Patient needs new Access, TPN will be worked out Monday with Pharmacy. New ABX Opat placed for 2 weeks.  MD aware to order [facility].   Expected Discharge Plan: Home w Home Health Services Barriers to Discharge: Continued Medical Work up, Other (must enter comment) (TPN/ IV ABX)  Expected Discharge Plan and Services In-house Referral: Clinical Social Work   Post Acute Care Choice: Resumption of Svcs/PTA Provider Living arrangements for the past 2 months: Skilled Nursing Facility                           HH Arranged: RN [facility] Agency: [facility] Date [facility] Agency Contacted: 04/04/22 Time HH Agency Contacted: 1151 Representative spoke with at [facility] Agency: Clydie Braun   Social Determinants of Health (SDOH) Interventions SDOH Screenings   Food Insecurity: No Food Insecurity (04/03/2022)  Housing: Low Risk  (04/03/2022)  Transportation Needs: No Transportation Needs (04/03/2022)  Utilities: Not At Risk (04/03/2022)  Tobacco Use: Medium Risk (04/07/2022)    Readmission Risk Interventions    04/04/2022   11:49 AM  Readmission Risk Prevention Plan  Transportation Screening Complete  HRI or Home Care Consult Complete  Social Work Consult for Recovery Care Planning/Counseling Complete  Palliative Care Screening Not Applicable  Medication Review Oceanographer) Complete

## 2022-04-11 DIAGNOSIS — Z86711 Personal history of pulmonary embolism: Secondary | ICD-10-CM | POA: Diagnosis not present

## 2022-04-11 DIAGNOSIS — E785 Hyperlipidemia, unspecified: Secondary | ICD-10-CM | POA: Diagnosis not present

## 2022-04-11 DIAGNOSIS — R7881 Bacteremia: Secondary | ICD-10-CM | POA: Diagnosis not present

## 2022-04-11 DIAGNOSIS — D61810 Antineoplastic chemotherapy induced pancytopenia: Secondary | ICD-10-CM | POA: Diagnosis not present

## 2022-04-11 LAB — MAGNESIUM: Magnesium: 1.6 mg/dL — ABNORMAL LOW (ref 1.7–2.4)

## 2022-04-11 LAB — COMPREHENSIVE METABOLIC PANEL
ALT: 32 U/L (ref 0–44)
AST: 22 U/L (ref 15–41)
Albumin: 1.6 g/dL — ABNORMAL LOW (ref 3.5–5.0)
Alkaline Phosphatase: 74 U/L (ref 38–126)
Anion gap: 4 — ABNORMAL LOW (ref 5–15)
BUN: 13 mg/dL (ref 8–23)
CO2: 20 mmol/L — ABNORMAL LOW (ref 22–32)
Calcium: 7.5 mg/dL — ABNORMAL LOW (ref 8.9–10.3)
Chloride: 114 mmol/L — ABNORMAL HIGH (ref 98–111)
Creatinine, Ser: 1.14 mg/dL (ref 0.61–1.24)
GFR, Estimated: >60 mL/min (ref >60)
Glucose, Bld: 92 mg/dL (ref 70–99)
Potassium: 4.3 mmol/L (ref 3.5–5.1)
Sodium: 138 mmol/L (ref 135–145)
Total Bilirubin: 0.3 mg/dL (ref 0.3–1.2)
Total Protein: 5 g/dL — ABNORMAL LOW (ref 6.5–8.1)

## 2022-04-11 LAB — PHOSPHORUS: Phosphorus: 3.5 mg/dL (ref 2.5–4.6)

## 2022-04-11 LAB — GLUCOSE, CAPILLARY: Glucose-Capillary: 90 mg/dL (ref 70–99)

## 2022-04-11 MED ORDER — TRAVASOL 10 % IV SOLN
INTRAVENOUS | Status: AC
  Filled 2022-04-11: qty 480

## 2022-04-11 MED ORDER — MAGNESIUM SULFATE 4 GM/100ML IV SOLN
4.0000 g | Freq: Once | INTRAVENOUS | Status: AC
  Administered 2022-04-11: 4 g via INTRAVENOUS
  Filled 2022-04-11: qty 100

## 2022-04-11 MED ORDER — INSULIN ASPART 100 UNIT/ML IJ SOLN
0.0000 [IU] | Freq: Three times a day (TID) | INTRAMUSCULAR | Status: DC
  Administered 2022-04-11: 1 [IU] via SUBCUTANEOUS

## 2022-04-11 MED ORDER — ORAL CARE MOUTH RINSE: 15.0000 mL | OROMUCOSAL | Status: DC | PRN

## 2022-04-11 NOTE — Progress Notes (Signed)
PHARMACY - TOTAL PARENTERAL NUTRITION CONSULT NOTE   Indication:  decreased nutrition started 03/19/22  Patient Measurements: Height: 5\' 8"  (172.7 cm) Weight: 54.4 kg (119 lb 14.9 oz) IBW/kg (Calculated) : 68.4 TPN AdjBW (KG): 54.4 Body mass index is 18.24 kg/m.   Assessment:  Patient started on TPN 03/19/22. 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 Minimal intake orally. Pharmacy asked to continue TPN. (obtained patients T[facility] formula [facility]adjusted as needed per patient labs. )  Patient with CONS bacteremia being treated with daptomycin. Patient has been off TPN therapy for the past 3 days for line holiday. PICC replaced last night, will resume today.   Patient getting some nutrition from PO intake and having been off TPN for several days will restart TPN at slightly lower rate of 39ml/hr.   Glucose / Insulin:  74-145 off insulin correction currently Electrolytes: K 4.3, corrected calcium 9.4 Mag 1.6 Phos 2.7>3.5 Renal: WNL Hepatic: Albumin 1.6 Intake / Output; MIVF: ensure enlive ordered tid but patient only agreeing to about 1 dose per day.   Central access: PICC placed 1/20 TPN start date: 03/19/22   Nutritional Goals: Increase nutrition for surgery  RD Assessment: Estimated Needs Total Energy Estimated Needs: 1900-2100 kcal Total Protein Estimated Needs: 95-115 grams Total Fluid Estimated Needs: >/= 2.3 L/day  Current Nutrition:  Current TPN provides: CHO 144 kcal, Prot 48g, Lipids 240Kcal. Total Kcal 921Kcal/day  Plan:  Port removed 1/18, Picc replaced 1/20 Replace magnesium outside of TPN - give 4g this morning Restart TPN at 2ml/hr Restart SSI this afternoon  43m PharmD., BCPS Clinical Pharmacist 04/11/2022 9:09 AM

## 2022-04-11 NOTE — Progress Notes (Signed)
Left upper chest port removal site cleansed, bacitracin applied, gauze and tegaderm applied. Dressing and care provided as ordered.

## 2022-04-11 NOTE — Progress Notes (Signed)
PROGRESS NOTE  [name] GOV:[id number] DOB: 1950-10-19 DOA: 04/03/2022 PCP: Romeo Rabon, MD  Brief History:  72 year old male with a history of pancreatic adenocarcinoma, colon adenocarcinoma status post resection May 2012, adenocarcinoma of the left lung status post resection, small bowel resection status post ileorectal anastomosis with short gut syndrome, iron deficiency anemia, pulmonary embolism on apixaban, hypertension, CKD stage III, GERD, remote tobacco and alcohol use (quit 2012) presenting with fevers, chills, and rigors of 5 days duration.  The patient initially visited the emergency department on 04/02/2021 with fevers, chills, and nausea.  Blood cultures were obtained during that visit.  He was given ceftriaxone, azithromycin, and IV fluids.  His BP improved and tachycardia improved and he was discharged home in stable condition.  He was called back to come to the emergency department because of positive blood cultures.  He continued to have nausea, fevers, chills.  He complains of generalized weakness and shortness of breath.  He denies any current emesis.  He states that he has chronic diarrhea, 4-5 bowel movements daily.  He denies hematochezia but has occasional melena.  He denies any headache, neck pain, coughing, hemoptysis. Notably, the patient was recently admitted to the hospital from 01/08/2022 to 01/11/2022 due to symptomatic anemia.  He was given 2 unit PRBC.  He will underwent EGD on 01/10/2022 which showed grade a reflux esophagitis without bleeding.  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  01/11/2022 flex sig showed patent side-to-side ileal colonic anastomosis with healthy-appearing mucosa.  There was diffuse moderate inflammation at the colonic anastomosis.  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  His apixaban was restarted on 01/11/2022. In the ED, the patient had a low-grade temperature of 99.8 F.  His blood pressure was soft  with systolic blood pressures in the 100s.  Oxygen saturation was 99% on room air.  WBC 4.2, hemoglobin 10.7, platelets 24,000.  Sodium 130, potassium 3.0, bicarbonate 22, serum creatinine 1.09.  Blood cultures are showing gram-positive cocci in 2 out of 2 sets.  The patient was started on vancomycin.   Assessment/Plan: Bacteremia-CoNS -04/02/2022 blood culture--S epi and S hominis 2 out of 2 sets - 04/03/22 blood culture--S epi and S hominis -04/04/22 TTE--EF 60-65%, no vegetation -04/05/22 blood culture>>GPC -1/14 Echo EF 60-65%, G1DD, no vegetations, normal RVF -ID eval appreciated; recommending>> status post Port-A-Cath removal.  Catheter tip culture requested. -Repeat blood cultures -TEE 04/09/2022: Demonstrating no vegetations or seeding. -Following ID recommendations plan will be for daptomycin for 2 weeks; OPAT in place. -Repeat blood culture from 04/08/2022 has remained without any growth. -Patient has remained afebrile; PICC line placed without complication -Planning to resume TPN and hopefully home 04/12/2022.  Home health orders in place.  Symptomatic anemia/melena -Once again, the patient's hemoglobin has drifted down from 11.6 on 03/17/22 -hemoglobin trend 7.7>>7.4>> latest 8.2 -GI consult appreciated>> status post endoscopy evaluation; mild erosive reflux esophagitis appreciated.  Surgical [place] stomach consistent with prior surgical history.  Small bowel at least to the proximal jejunum appear normal.  Losing mucosa at the anastomosis appreciated (status post epinephrine injection and APC).  Given patient history of pancreatic cancer unable to rule out an element of Hemosuccus pancreaticus. -Patient received transfusion of 1 unit PRBC on 04/04/22 -Continue to follow hemoglobin trend, further transfuse as needed.  (Transition threshold less than 7).. -Hemoglobin has remained stable; no overt bleeding appreciated.  Hyponatremia -Secondary to poor solute intake and volume  depletion -  Continue IV normal saline -Electrolytes trend.   Hypokalemia -Continue to follow electrolytes and further replete as needed -Magnesium within normal limits.   Hypophosphatemia -repleted  -Continue to follow electrolytes trend and further replete as needed.   Pancreatic adenocarcinoma -Status post FOLFIRINOX 10/16/2021>> 12/30/2021 -Patient was started on TPN 03/19/2022 to improve his nutritional status for pancreatic tail resection -Pancreatic tail resection and splenectomy has been put on hold secondary to his poor nutritional status. -Continue outpatient follow-up with oncology service. -Resume TPN.   Iron deficiency anemia -04/20/2022 iron saturation 50 -Follow hemoglobin trend and transfuse as needed.   Pancytopenia due to antineoplastic chemotherapy (HCC) -Last FOLFIRINOX 01/01/22 -Continue to follow serial CBC -Anticipated lower hemoglobin level in the setting of acute bleed.   CKD 3a -baseline creatinine 1.0-1.3 -Appears to be stable and at baseline currently -Continue to maintain adequate hydration.   Chronic abdominal pain -Patient has chronic left-sided abdominal pain -Previously attributable to the patient's malignancy, unchanged in the past month -01/06/2022 MRI abdomen--area of restricted diffusion in the distal pancreas 2.0 x 1.9 cm.  slightly smaller mass in the splenic hilum -Continue as needed analgesics.  Overall well-controlled.   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 -Reporting no significant bowel movements currently.   HTN (hypertension) -No longer take carvedilol; blood pressure overall stable. -Heart healthy discussed with patient. -Continue to follow vital signs.   Personal history of PE (pulmonary embolism) -Patient was taking apixaban prior to admission -Continue to hold apixaban given acute GI bleed concerns.   -Planning to resume  anticoagulation once cleared by GI (presumably clear for resumption of anticoagulation on 04/12/2022). -Patient has remained without overt bleeding at this point. -Continue to follow hemoglobin trend.    Family Communication:  spouse updated at bedside (04/09/2022).   Consultants:  GI, ID   Code Status:  FULL    DVT Prophylaxis:  SCDs     Procedures: As Listed in Progress Note Above   Antibiotics: Vanco 04/03/22>>04/10/22 Daptomycin 04/10/22   Subjective: No fever, no chest pain, no nausea, no vomiting.  Eating reported and so far has tolerated soft diet.  PICC line placed without complications.  Objective: Vitals:   04/10/22 1307 04/10/22 2000 04/11/22 0345 04/11/22 1259  BP: 113/69 114/75 122/73 118/75  Pulse: 94 84 75 86  Resp:  17 16 16   Temp: 98.6 F (37 C) 98.3 F (36.8 C) 98.7 F (37.1 C) 98.5 F (36.9 C)  TempSrc:  Oral Oral Oral  SpO2: 97% 99% 100% 99%  Weight:      Height:        Intake/Output Summary (Last 24 hours) at 04/11/2022 1741 Last data filed at 04/11/2022 1723 Gross per 24 hour  Intake 651.13 ml  Output 400 ml  Net 251.13 ml   Weight change:   Exam: General exam: Alert, awake, oriented x 3; afebrile, no chest pain, no nausea, no vomiting.  Having lunch at time of evaluation and in not acute distress. Respiratory system: Clear to auscultation. Respiratory effort normal.  Good saturation on room air. Cardiovascular system:RRR. No rubs or gallops; no JVD. Gastrointestinal system: Abdomen is nondistended, soft and nontender. No organomegaly or masses felt. Normal bowel sounds heard. Central nervous system: Alert and oriented. No focal neurological deficits. Extremities: No cyanosis or clubbing. Skin: No petechiae. Psychiatry: Judgement and insight appear normal. Mood & affect appropriate.    Data Reviewed: I have personally reviewed following labs and imaging studies  Basic  Metabolic Panel: Recent Labs  Lab 04/05/22 0524 04/06/22 0524  04/07/22 0402 04/08/22 0357 04/10/22 0805 04/11/22 0454  NA 136 134* 136 136 138 138  K 4.2 4.3 4.3 4.6 3.8 4.3  CL 109 106 108 108 114* 114*  CO2 20* 23 26 23  17* 20*  GLUCOSE 123* 118* 120* 116* 103* 92  BUN 11 11 12 14 12 13   CREATININE 0.98 1.03 0.85 0.88 1.18 1.14  CALCIUM 7.1* 7.2* 7.6* 7.7* 7.3* 7.5*  MG 2.1 1.7 2.2 1.7 1.7 1.6*  PHOS 2.1* 3.1  --  4.4 2.7 3.5   Liver Function Tests: Recent Labs  Lab 04/05/22 0524 04/08/22 0357 04/10/22 0805 04/11/22 0454  AST 19 55* 26 22  ALT 20 49* 35 32  ALKPHOS 69 87 79 74  BILITOT 0.3 0.3 0.1* 0.3  PROT 4.2* 4.8* 5.0* 5.0*  ALBUMIN 1.5* 1.5* 1.6* 1.6*   Coagulation Profile: No results for input(s): "INR", "PROTIME" in the last 168 hours.  CBC: Recent Labs  Lab 04/05/22 0524 04/06/22 0524 04/07/22 0402  WBC 5.5 7.9 7.8  HGB 7.4* 8.2* 8.2*  HCT 22.6* 24.7* 24.9*  MCV 93.8 93.2 92.2  PLT 71* 96* 124*   CBG: Recent Labs  Lab 04/09/22 2347 04/10/22 1121 04/10/22 1624 04/10/22 2324 04/11/22 0506  GLUCAP 74 111* 145* 93 90   Urine analysis:    Component Value Date/Time   COLORURINE YELLOW 04/02/2022 1533   APPEARANCEUR HAZY (A) 04/02/2022 1533   LABSPEC 1.013 04/02/2022 1533   PHURINE 5.0 04/02/2022 1533   GLUCOSEU NEGATIVE 04/02/2022 1533   HGBUR NEGATIVE 04/02/2022 1533   BILIRUBINUR NEGATIVE 04/02/2022 1533   KETONESUR NEGATIVE 04/02/2022 1533   PROTEINUR 30 (A) 04/02/2022 1533   UROBILINOGEN 0.2 11/30/2014 1115   NITRITE NEGATIVE 04/02/2022 1533   LEUKOCYTESUR NEGATIVE 04/02/2022 1533   Sepsis Labs:  Recent Results (from the past 240 hour(s))  Resp panel by RT-PCR (RSV, Flu A&B, Covid) Anterior Nasal Swab     Status: None   Collection Time: 04/02/22 12:36 PM   Specimen: Anterior Nasal Swab  Result Value Ref Range Status   SARS Coronavirus 2 by RT PCR NEGATIVE NEGATIVE Final    Comment: (NOTE) SARS-CoV-2 target nucleic acids are NOT DETECTED.  The SARS-CoV-2 RNA is generally detectable in  upper respiratory specimens during the acute phase of infection. The lowest concentration of SARS-CoV-2 viral copies this assay can detect is 138 copies/mL. A negative result does not preclude SARS-Cov-2 infection and should not be used as the sole basis for treatment or other patient management decisions. A negative result may occur with  improper specimen collection/handling, submission of specimen other than nasopharyngeal swab, presence of viral mutation(s) within the areas targeted by this assay, and inadequate number of viral copies(<138 copies/mL). A negative result must be combined with clinical observations, patient history, and epidemiological information. The expected result is Negative.  Fact Sheet for Patients:  06/01/2022  Fact Sheet for Healthcare Providers:  06/01/22  This test is no t yet approved or cleared by the [employer] FDA and  has been authorized for detection and/or diagnosis of SARS-CoV-2 by FDA under an Emergency Use Authorization (EUA). This EUA will remain  in effect (meaning this test can be used) for the duration of the COVID-19 declaration under Section 564(b)(1) of the Act, 21 U.S.C.section 360bbb-3(b)(1), unless the authorization is terminated  or revoked sooner.       Influenza A by PCR NEGATIVE NEGATIVE Final   Influenza B  by PCR NEGATIVE NEGATIVE Final    Comment: (NOTE) The Xpert Xpress SARS-CoV-2/FLU/RSV plus assay is intended as an aid in the diagnosis of influenza from Nasopharyngeal swab specimens and should not be used as a sole basis for treatment. Nasal washings and aspirates are unacceptable for Xpert Xpress SARS-CoV-2/FLU/RSV testing.  Fact Sheet for Patients: [employer]  Fact Sheet for Healthcare Providers: [employer]  This test is not yet approved or cleared by the [place] FDA and has been  authorized for detection and/or diagnosis of SARS-CoV-2 by FDA under an Emergency Use Authorization (EUA). This EUA will remain in effect (meaning this test can be used) for the duration of the COVID-19 declaration under Section 564(b)(1) of the Act, 21 U.S.C. section 360bbb-3(b)(1), unless the authorization is terminated or revoked.     Resp Syncytial Virus by PCR NEGATIVE NEGATIVE Final    Comment: (NOTE) Fact Sheet for Patients: [employer]  Fact Sheet for Healthcare Providers: [employer]ious[employer]ker[employer]it  This test is not yet approved or cleared by the [place] FDA and has been authorized for detection and/or diagnosis of SARS-CoV-2 by FDA under an Emergency Use Authorization (EUA). This EUA will remain in effect (meaning this test can be used) for the duration of the COVID-19 declaration under Section 564(b)(1) of the Act, 21 U.S.C. section 360bbb-3(b)(1), unless the authorization is terminated or revoked.  Performed at [facility], [address] [place], [place] [address]   Culture, blood (Routine x 2)     Status: Abnormal   Collection Time: 04/02/22  2:01 PM   Specimen: BLOOD  Result Value Ref Range Status   Specimen Description BLOOD LEFT ANTECUBITAL  Final   Special Requests   Final    BOTTLES DRAWN AEROBIC AND ANAEROBIC Blood Culture adequate volume   Culture  Setup Time   Final    GRAM POSITIVE COCCI ANAEROBIC BOTTLE ONLY Gram Stain Report Called to,Read Back By and Verified With: WHITE M @ 0838 ON [id number] BY HENDERSON L GRAM POSITIVE COCCI AEROBIC BOTTLE ONLY CRITICAL RESULT CALLED TO, READ BACK BY AND VERIFIED WITH:  C/ M. WHITE, RN 04/03/22 1400 A. LAFRANCE Performed at [facility], [address], [place], [place] [address]    Culture (A)  Final    STAPHYLOCOCCUS EPIDERMIDIS STAPHYLOCOCCUS HOMINIS    Report Status 04/06/2022 FINAL  Final   Organism ID, Bacteria STAPHYLOCOCCUS EPIDERMIDIS  Final    Organism ID, Bacteria STAPHYLOCOCCUS HOMINIS  Final      Susceptibility   Staphylococcus epidermidis - MIC*    CIPROFLOXACIN <=0.5 SENSITIVE Sensitive     ERYTHROMYCIN >=8 RESISTANT Resistant     GENTAMICIN <=0.5 SENSITIVE Sensitive     OXACILLIN >=4 RESISTANT Resistant     TETRACYCLINE 2 SENSITIVE Sensitive     VANCOMYCIN 1 SENSITIVE Sensitive     TRIMETH/SULFA <=10 SENSITIVE Sensitive     CLINDAMYCIN <=0.25 SENSITIVE Sensitive     RIFAMPIN <=0.5 SENSITIVE Sensitive     Inducible Clindamycin NEGATIVE Sensitive     * STAPHYLOCOCCUS EPIDERMIDIS   Staphylococcus hominis - MIC*    CIPROFLOXACIN <=0.5 SENSITIVE Sensitive     ERYTHROMYCIN >=8 RESISTANT Resistant     GENTAMICIN <=0.5 SENSITIVE Sensitive     OXACILLIN >=4 RESISTANT Resistant     TETRACYCLINE >=16 RESISTANT Resistant     VANCOMYCIN <=0.5 SENSITIVE Sensitive     TRIMETH/SULFA <=10 SENSITIVE Sensitive     CLINDAMYCIN <=0.25 SENSITIVE Sensitive     RIFAMPIN <=0.5 SENSITIVE Sensitive     Inducible Clindamycin  NEGATIVE Sensitive     * STAPHYLOCOCCUS HOMINIS  Culture, blood (Routine x 2)     Status: Abnormal   Collection Time: 04/02/22  2:01 PM   Specimen: BLOOD LEFT HAND  Result Value Ref Range Status   Specimen Description   Final    BLOOD LEFT HAND BOTTLES DRAWN AEROBIC ONLY Performed at [facility], [address] [place], [place] [address]    Special Requests   Final    Blood Culture adequate volume Performed at [facility], [address], [place], [place] [address]    Culture  Setup Time   Final    GRAM POSITIVE COCCI AEROBIC BOTTLE ONLY Gram Stain Report Called to,Read Back By and Verified With: WHITE M @ 1334 ON [id number] BY HENDERSON L CRITICAL VALUE NOTED.  VALUE IS CONSISTENT WITH PREVIOUSLY REPORTED AND CALLED VALUE.    Culture (A)  Final    STAPHYLOCOCCUS EPIDERMIDIS STAPHYLOCOCCUS HOMINIS SUSCEPTIBILITIES PERFORMED ON PREVIOUS CULTURE WITHIN THE LAST 5 DAYS. Performed at [facility], [address], [place], [place] [address]    Report Status 04/06/2022 FINAL  Final  Blood Culture ID Panel (Reflexed)     Status: Abnormal   Collection Time: 04/02/22  2:01 PM  Result Value Ref Range Status   Enterococcus faecalis NOT DETECTED NOT DETECTED Final   Enterococcus Faecium NOT DETECTED NOT DETECTED Final   Listeria monocytogenes NOT DETECTED NOT DETECTED Final   Staphylococcus species DETECTED (A) NOT DETECTED Final    Comment: CRITICAL RESULT CALLED TO, READ BACK BY AND VERIFIED WITH:  C/ M. WHITE, RN 04/03/22 1400 A. LAFRANCE    Staphylococcus aureus (BCID) NOT DETECTED NOT DETECTED Final   Staphylococcus epidermidis DETECTED (A) NOT DETECTED Final    Comment: Methicillin (oxacillin) resistant coagulase negative staphylococcus. Possible blood culture contaminant (unless isolated from more than one blood culture draw or clinical case suggests pathogenicity). No antibiotic treatment is indicated for blood  culture contaminants. CRITICAL RESULT CALLED TO, READ BACK BY AND VERIFIED WITH:  C/ M. WHITE, RN 04/03/22 1400 A. LAFRANCE    Staphylococcus lugdunensis NOT DETECTED NOT DETECTED Final   Streptococcus species NOT DETECTED NOT DETECTED Final   Streptococcus agalactiae NOT DETECTED NOT DETECTED Final   Streptococcus pneumoniae NOT DETECTED NOT DETECTED Final   Streptococcus pyogenes NOT DETECTED NOT DETECTED Final   A.calcoaceticus-baumannii NOT DETECTED NOT DETECTED Final   Bacteroides fragilis NOT DETECTED NOT DETECTED Final   Enterobacterales NOT DETECTED NOT DETECTED Final   Enterobacter cloacae complex NOT DETECTED NOT DETECTED Final   Escherichia coli NOT DETECTED NOT DETECTED Final   Klebsiella aerogenes NOT DETECTED NOT DETECTED Final   Klebsiella oxytoca NOT DETECTED NOT DETECTED Final   Klebsiella pneumoniae NOT DETECTED NOT DETECTED Final   Proteus species NOT DETECTED NOT DETECTED Final   Salmonella species NOT DETECTED NOT DETECTED Final   Serratia marcescens NOT  DETECTED NOT DETECTED Final   Haemophilus influenzae NOT DETECTED NOT DETECTED Final   Neisseria meningitidis NOT DETECTED NOT DETECTED Final   Pseudomonas aeruginosa NOT DETECTED NOT DETECTED Final   Stenotrophomonas maltophilia NOT DETECTED NOT DETECTED Final   Candida albicans NOT DETECTED NOT DETECTED Final   Candida auris NOT DETECTED NOT DETECTED Final   Candida glabrata NOT DETECTED NOT DETECTED Final   Candida krusei NOT DETECTED NOT DETECTED Final   Candida parapsilosis NOT DETECTED NOT DETECTED Final   Candida tropicalis NOT DETECTED NOT DETECTED Final   Cryptococcus neoformans/gattii NOT DETECTED NOT DETECTED Final  Methicillin resistance mecA/C DETECTED (A) NOT DETECTED Final    Comment: CRITICAL RESULT CALLED TO, READ BACK BY AND VERIFIED WITH:  C/ M. WHITE, RN 04/03/22 1400 A. LAFRANCE Performed at [facility], [address], [place], [place] [address]   Urine Culture     Status: None   Collection Time: 04/02/22  3:33 PM   Specimen: Urine, Catheterized  Result Value Ref Range Status   Specimen Description   Final    URINE, CATHETERIZED Performed at [facility], [address] [place], [place] [address]    Special Requests   Final    NONE Performed at [facility], [address] [place], [place] [address]    Culture   Final    NO GROWTH Performed at [facility], [address] [place], [place] [address]    Report Status 04/04/2022 FINAL  Final  Blood culture (routine x 2)     Status: Abnormal   Collection Time: 04/03/22  4:23 PM   Specimen: BLOOD  Result Value Ref Range Status   Specimen Description BLOOD LEFT ANTECUBITAL  Final   Special Requests   Final    BOTTLES DRAWN AEROBIC AND ANAEROBIC Blood Culture adequate volume   Culture  Setup Time   Final    GRAM POSITIVE COCCI Gram Stain Report Called to,Read Back By and Verified With: T. Darcel Bayley  @ 0944 BY STEPHTR  04/04/22 IN BOTH AEROBIC AND ANAEROBIC BOTTLES CRITICAL VALUE NOTED.  VALUE  IS CONSISTENT WITH PREVIOUSLY REPORTED AND CALLED VALUE. Performed at [facility], [address] [place], [place] [address]    Culture (A)  Final    STAPHYLOCOCCUS EPIDERMIDIS STAPHYLOCOCCUS HOMINIS STAPHYLOCOCCUS HOMINIS SUSCEPTIBILITIES PERFORMED ON PREVIOUS CULTURE WITHIN THE LAST 5 DAYS.    Report Status 04/07/2022 FINAL  Final   Organism ID, Bacteria STAPHYLOCOCCUS EPIDERMIDIS  Final      Susceptibility   Staphylococcus epidermidis - MIC*    CIPROFLOXACIN <=0.5 SENSITIVE Sensitive     ERYTHROMYCIN >=8 RESISTANT Resistant     GENTAMICIN <=0.5 SENSITIVE Sensitive     OXACILLIN >=4 RESISTANT Resistant     TETRACYCLINE 2 SENSITIVE Sensitive     VANCOMYCIN 2 SENSITIVE Sensitive     TRIMETH/SULFA <=10 SENSITIVE Sensitive     CLINDAMYCIN <=0.25 SENSITIVE Sensitive     RIFAMPIN <=0.5 SENSITIVE Sensitive     Inducible Clindamycin NEGATIVE Sensitive     * STAPHYLOCOCCUS EPIDERMIDIS  Blood culture (routine x 2)     Status: Abnormal   Collection Time: 04/03/22  4:28 PM   Specimen: BLOOD RIGHT HAND  Result Value Ref Range Status   Specimen Description BLOOD RIGHT HAND  Final   Special Requests   Final    BOTTLES DRAWN AEROBIC AND ANAEROBIC Blood Culture adequate volume   Culture  Setup Time   Final    GRAM POSITIVE COCCI Gram Stain Report Called to,Read Back By and Verified With:  T. LEONARD @ 0944 BY STEPHTR 04/04/22 IN BOTH AEROBIC AND ANAEROBIC BOTTLES Organism ID to follow CRITICAL RESULT CALLED TO, READ BACK BY AND VERIFIED WITH:  Perlie Gold, RN 04/04/22 1552 A. LAFRANCE Performed at [facility], [address], [place], [place] [address]    Culture (A)  Final    STAPHYLOCOCCUS EPIDERMIDIS STAPHYLOCOCCUS HOMINIS STAPHYLOCOCCUS HOMINIS SUSCEPTIBILITIES PERFORMED ON PREVIOUS CULTURE WITHIN THE LAST 5 DAYS.    Report Status 04/07/2022 FINAL  Final   Organism ID, Bacteria STAPHYLOCOCCUS EPIDERMIDIS  Final  Susceptibility   Staphylococcus epidermidis -  MIC*    CIPROFLOXACIN <=0.5 SENSITIVE Sensitive     ERYTHROMYCIN >=8 RESISTANT Resistant     GENTAMICIN <=0.5 SENSITIVE Sensitive     OXACILLIN >=4 RESISTANT Resistant     TETRACYCLINE 2 SENSITIVE Sensitive     VANCOMYCIN 2 SENSITIVE Sensitive     TRIMETH/SULFA <=10 SENSITIVE Sensitive     CLINDAMYCIN <=0.25 SENSITIVE Sensitive     RIFAMPIN <=0.5 SENSITIVE Sensitive     Inducible Clindamycin NEGATIVE Sensitive     * STAPHYLOCOCCUS EPIDERMIDIS  Blood Culture ID Panel (Reflexed)     Status: Abnormal   Collection Time: 04/03/22  4:28 PM  Result Value Ref Range Status   Enterococcus faecalis NOT DETECTED NOT DETECTED Final   Enterococcus Faecium NOT DETECTED NOT DETECTED Final   Listeria monocytogenes NOT DETECTED NOT DETECTED Final   Staphylococcus species DETECTED (A) NOT DETECTED Final    Comment: CRITICAL RESULT CALLED TO, READ BACK BY AND VERIFIED WITH:  Perlie Gold, RN 04/04/22 1552 A. LAFRANCE    Staphylococcus aureus (BCID) NOT DETECTED NOT DETECTED Final   Staphylococcus epidermidis DETECTED (A) NOT DETECTED Final    Comment: Methicillin (oxacillin) resistant coagulase negative staphylococcus. Possible blood culture contaminant (unless isolated from more than one blood culture draw or clinical case suggests pathogenicity). No antibiotic treatment is indicated for blood  culture contaminants. CRITICAL RESULT CALLED TO, READ BACK BY AND VERIFIED WITH:  Perlie Gold, RN 04/04/22 1552 A. LAFRANCE    Staphylococcus lugdunensis NOT DETECTED NOT DETECTED Final   Streptococcus species NOT DETECTED NOT DETECTED Final   Streptococcus agalactiae NOT DETECTED NOT DETECTED Final   Streptococcus pneumoniae NOT DETECTED NOT DETECTED Final   Streptococcus pyogenes NOT DETECTED NOT DETECTED Final   A.calcoaceticus-baumannii NOT DETECTED NOT DETECTED Final   Bacteroides fragilis NOT DETECTED NOT DETECTED Final   Enterobacterales NOT DETECTED NOT DETECTED Final   Enterobacter cloacae  complex NOT DETECTED NOT DETECTED Final   Escherichia coli NOT DETECTED NOT DETECTED Final   Klebsiella aerogenes NOT DETECTED NOT DETECTED Final   Klebsiella oxytoca NOT DETECTED NOT DETECTED Final   Klebsiella pneumoniae NOT DETECTED NOT DETECTED Final   Proteus species NOT DETECTED NOT DETECTED Final   Salmonella species NOT DETECTED NOT DETECTED Final   Serratia marcescens NOT DETECTED NOT DETECTED Final   Haemophilus influenzae NOT DETECTED NOT DETECTED Final   Neisseria meningitidis NOT DETECTED NOT DETECTED Final   Pseudomonas aeruginosa NOT DETECTED NOT DETECTED Final   Stenotrophomonas maltophilia NOT DETECTED NOT DETECTED Final   Candida albicans NOT DETECTED NOT DETECTED Final   Candida auris NOT DETECTED NOT DETECTED Final   Candida glabrata NOT DETECTED NOT DETECTED Final   Candida krusei NOT DETECTED NOT DETECTED Final   Candida parapsilosis NOT DETECTED NOT DETECTED Final   Candida tropicalis NOT DETECTED NOT DETECTED Final   Cryptococcus neoformans/gattii NOT DETECTED NOT DETECTED Final   Methicillin resistance mecA/C DETECTED (A) NOT DETECTED Final    Comment: CRITICAL RESULT CALLED TO, READ BACK BY AND VERIFIED WITH:  Perlie Gold, RN 04/04/22 1552 A. LAFRANCE Performed at [facility], [address] [place], [place] [address]   Culture, blood (Routine X 2) w Reflex to ID Panel     Status: Abnormal   Collection Time: 04/04/22 10:20 AM   Specimen: BLOOD RIGHT ARM  Result Value Ref Range Status   Specimen Description   Final    BLOOD RIGHT ARM BOTTLES DRAWN AEROBIC AND  ANAEROBIC   Special Requests   Final    Blood Culture results may not be optimal due to an excessive volume of blood received in culture bottles   Culture  Setup Time   Final    GRAM POSITIVE COCCI ANAEROBIC BOTTLE ONLY Gram Stain Report Called to,Read Back By and Verified With: BASS V. AT 1513 ON [id number] BY THOMPSON S. CRITICAL VALUE NOTED.  VALUE IS CONSISTENT WITH PREVIOUSLY REPORTED AND  CALLED VALUE.    Culture (A)  Final    STAPHYLOCOCCUS EPIDERMIDIS SUSCEPTIBILITIES PERFORMED ON PREVIOUS CULTURE WITHIN THE LAST 5 DAYS.    Report Status 04/09/2022 FINAL  Final  Culture, blood (Routine X 2) w Reflex to ID Panel     Status: None   Collection Time: 04/04/22 10:20 AM   Specimen: BLOOD RIGHT HAND  Result Value Ref Range Status   Specimen Description   Final    BLOOD RIGHT HAND BOTTLES DRAWN AEROBIC AND ANAEROBIC   Special Requests   Final    Blood Culture results may not be optimal due to an excessive volume of blood received in culture bottles   Culture   Final    NO GROWTH 5 DAYS Performed at [facility], [address], [place], [place] [address]    Report Status 04/09/2022 FINAL  Final  Gastrointestinal Panel by PCR , Stool     Status: None   Collection Time: 04/04/22  2:24 PM  Result Value Ref Range Status   Campylobacter species NOT DETECTED NOT DETECTED Final   Plesimonas shigelloides NOT DETECTED NOT DETECTED Final   Salmonella species NOT DETECTED NOT DETECTED Final   Yersinia enterocolitica NOT DETECTED NOT DETECTED Final   Vibrio species NOT DETECTED NOT DETECTED Final   Vibrio cholerae NOT DETECTED NOT DETECTED Final   Enteroaggregative E coli (EAEC) NOT DETECTED NOT DETECTED Final   Enteropathogenic E coli (EPEC) NOT DETECTED NOT DETECTED Final   Enterotoxigenic E coli (ETEC) NOT DETECTED NOT DETECTED Final   Shiga like toxin producing E coli (STEC) NOT DETECTED NOT DETECTED Final   Shigella/Enteroinvasive E coli (EIEC) NOT DETECTED NOT DETECTED Final   Cryptosporidium NOT DETECTED NOT DETECTED Final   Cyclospora cayetanensis NOT DETECTED NOT DETECTED Final   Entamoeba histolytica NOT DETECTED NOT DETECTED Final   Giardia lamblia NOT DETECTED NOT DETECTED Final   Adenovirus F40/41 NOT DETECTED NOT DETECTED Final   Astrovirus NOT DETECTED NOT DETECTED Final   Norovirus GI/GII NOT DETECTED NOT DETECTED Final   Rotavirus A NOT DETECTED NOT DETECTED  Final   Sapovirus (I, II, IV, and V) NOT DETECTED NOT DETECTED Final    Comment: Performed at [facility], [address] [place], [place] [address]  Culture, blood (single) w Reflex to ID Panel     Status: Abnormal   Collection Time: 04/05/22  4:30 PM   Specimen: Right Antecubital; Blood  Result Value Ref Range Status   Specimen Description   Final    RIGHT ANTECUBITAL Performed at [facility], [address], [place], [place] [address]    Special Requests   Final    BOTTLES DRAWN AEROBIC AND ANAEROBIC Blood Culture adequate volume Performed at [facility], [address] [place], [place] [address]    Culture  Setup Time   Final    GRAM POSITIVE COCCI Gram Stain Report Called to,Read Back By and Verified With: MUSE,[email or web address]  BY MATTHEWS, B 1.16.2024 IN BOTH AEROBIC AND ANAEROBIC BOTTLES CRITICAL VALUE NOTED.  VALUE IS CONSISTENT WITH PREVIOUSLY  REPORTED AND CALLED VALUE.    Culture (A)  Final    STAPHYLOCOCCUS EPIDERMIDIS SUSCEPTIBILITIES PERFORMED ON PREVIOUS CULTURE WITHIN THE LAST 5 DAYS. Performed at [facility], [address], [place], [place] [address]    Report Status 04/08/2022 FINAL  Final  Culture, blood (Routine X 2) w Reflex to ID Panel     Status: None (Preliminary result)   Collection Time: 04/08/22  9:28 AM   Specimen: BLOOD  Result Value Ref Range Status   Specimen Description BLOOD RIGHT ANTECUBITAL  Final   Special Requests   Final    BOTTLES DRAWN AEROBIC AND ANAEROBIC Blood Culture adequate volume   Culture   Final    NO GROWTH 3 DAYS Performed at [facility], [address], [place], [place] [address]    Report Status PENDING  Incomplete  Culture, blood (Routine X 2) w Reflex to ID Panel     Status: None (Preliminary result)   Collection Time: 04/08/22  9:33 AM   Specimen: BLOOD  Result Value Ref Range Status   Specimen Description BLOOD BLOOD LEFT HAND  Final   Special Requests   Final    BOTTLES DRAWN AEROBIC AND ANAEROBIC Blood  Culture adequate volume   Culture   Final    NO GROWTH 3 DAYS Performed at [facility], [address], [place], [place] [address]    Report Status PENDING  Incomplete  Cath Tip Culture     Status: Abnormal (Preliminary result)   Collection Time: 04/08/22 10:10 AM   Specimen: Catheter Tip; Other  Result Value Ref Range Status   Specimen Description   Final    PORTA CATH Performed at [facility], [address] [place], [place] [address]    Special Requests   Final    NONE Performed at [name], [address], [place], [place] [address]    Culture (A)  Final    >=100,000 COLONIES/mL STAPHYLOCOCCUS EPIDERMIDIS CULTURE REINCUBATED FOR BETTER GROWTH Performed at [facility], [address] [place], [place] [address]    Report Status PENDING  Incomplete   Organism ID, Bacteria STAPHYLOCOCCUS EPIDERMIDIS (A)  Final      Susceptibility   Staphylococcus epidermidis - MIC*    CIPROFLOXACIN <=0.5 SENSITIVE Sensitive     ERYTHROMYCIN >=8 RESISTANT Resistant     GENTAMICIN <=0.5 SENSITIVE Sensitive     OXACILLIN >=4 RESISTANT Resistant     TETRACYCLINE 2 SENSITIVE Sensitive     VANCOMYCIN 2 SENSITIVE Sensitive     TRIMETH/SULFA <=10 SENSITIVE Sensitive     CLINDAMYCIN <=0.25 SENSITIVE Sensitive     RIFAMPIN <=0.5 SENSITIVE Sensitive     Inducible Clindamycin NEGATIVE Sensitive     * >=100,000 COLONIES/mL STAPHYLOCOCCUS EPIDERMIDIS  Culture, blood (Routine X 2) w Reflex to ID Panel     Status: None (Preliminary result)   Collection Time: 04/09/22  3:52 PM   Specimen: Right Antecubital; Blood  Result Value Ref Range Status   Specimen Description   Final    RIGHT ANTECUBITAL BOTTLES DRAWN AEROBIC AND ANAEROBIC   Special Requests Blood Culture adequate volume  Final   Culture   Final    NO GROWTH 2 DAYS Performed at [facility], [address] [place], [place] [address]    Report Status PENDING  Incomplete  Culture, blood (Routine X 2) w Reflex to ID Panel      Status: None (Preliminary result)   Collection Time: 04/09/22  3:52 PM  Specimen: BLOOD RIGHT HAND  Result Value Ref Range Status   Specimen Description   Final    BLOOD RIGHT HAND BOTTLES DRAWN AEROBIC AND ANAEROBIC   Special Requests Blood Culture adequate volume  Final   Culture   Final    NO GROWTH 2 DAYS Performed at [facility], [address], [place], [place] [address]    Report Status PENDING  Incomplete     Scheduled Meds:  acidophilus  1 capsule Oral Daily   atorvastatin  20 mg Oral Daily   bacitracin   Topical Daily   Chlorhexidine Gluconate Cloth  6 each Topical Daily   cholestyramine light  4 g Oral Daily   dicyclomine  10 mg Oral TID AC & HS   diphenoxylate-atropine  1 tablet Oral QID   escitalopram  20 mg Oral Daily   feeding supplement  237 mL Oral TID BM   gabapentin  100 mg Oral QHS   insulin aspart  0-9 Units Subcutaneous Q8H   lipase/protease/amylase  108,000 Units Oral TID WC   megestrol  400 mg Oral BID   mirtazapine  30 mg Oral QHS   pantoprazole  40 mg Oral BID AC   sodium chloride flush  10-40 mL Intracatheter Q12H   Continuous Infusions:  DAPTOmycin (CUBICIN) 450 mg in sodium chloride 0.9 % IVPB Stopped (04/10/22 2056)   TPN ADULT (ION) 40 mL/hr at 04/11/22 1723    Procedures/Studies: [place] EKG SITE RITE  Result Date: 04/10/2022 If Site Rite image not attached, placement could not be confirmed due to current cardiac rhythm.  ECHO TEE  Result Date: 04/09/2022    TRANSESOPHOGEAL ECHO REPORT   Patient Name:   [name] Date of Exam: 04/09/2022 Medical Rec #:  [id number]        Height:       68.0 in Accession #:    [id number]       Weight:       119.9 lb Date of Birth:  06-11-1950       BSA:          1.644 m Patient Age:    71 years         BP:           130/80 mmHg Patient Gender: M                HR:           78 bpm. [facility] Procedure: Transesophageal Echo, Color Doppler and Cardiac Doppler Indications:    Bacteremia   History:        Patient has prior history of Echocardiogram examinations, most                 recent 04/04/2022. Signs/Symptoms:Chest Pain; Risk                 Factors:Hypertension and Dyslipidemia. CKD, Colon CA.  Sonographer:    Mikki [name] Referring Phys: [id number] Dorothe Pea BRANCH PROCEDURE: The transesophogeal probe was passed without difficulty through the esophogus of the patient. Sedation performed by different physician. The patient developed no complications during the procedure.  IMPRESSIONS  1. Left ventricular ejection fraction, by estimation, is 60 to 65%. The left ventricle has normal function.  2. Right ventricular systolic function is normal. The right ventricular size is normal.  3. No left atrial/left atrial appendage thrombus was detected. The LAA emptying velocity was 97 cm/s.  4. The mitral valve is normal in structure.  No evidence of mitral valve regurgitation. No evidence of mitral stenosis.  5. The aortic valve is tricuspid. Aortic valve regurgitation is not visualized. No aortic stenosis is present. Conclusion(s)/Recommendation(s): No evidence of vegetation/infective endocarditis on this transesophageael echocardiogram. FINDINGS  Left Ventricle: Left ventricular ejection fraction, by estimation, is 60 to 65%. The left ventricle has normal function. The left ventricular internal cavity size was normal in size. Right Ventricle: The right ventricular size is normal. Right vetricular wall thickness was not well visualized. Right ventricular systolic function is normal. Left Atrium: Left atrial size was normal in size. No left atrial/left atrial appendage thrombus was detected. The LAA emptying velocity was 97 cm/s. Right Atrium: Right atrial size was normal in size. Pericardium: There is no evidence of pericardial effusion. Mitral Valve: The mitral valve is normal in structure. No evidence of mitral valve regurgitation. No evidence of mitral valve stenosis. Tricuspid Valve: The tricuspid  valve is normal in structure. Tricuspid valve regurgitation is mild . No evidence of tricuspid stenosis. Aortic Valve: The aortic valve is tricuspid. Aortic valve regurgitation is not visualized. No aortic stenosis is present. Pulmonic Valve: The pulmonic valve was not well visualized. Pulmonic valve regurgitation is not visualized. No evidence of pulmonic stenosis. Aorta: The aortic root is normal in size and structure. IAS/Shunts: No atrial level shunt detected by color flow Doppler.   AORTA Ao Root diam: 3.50 cm Dina Rich MD Electronically signed by Dina Rich MD Signature Date/Time: 04/09/2022/2:49:02 PM    Final    DG Chest Port 1 View  Result Date: 04/08/2022 CLINICAL DATA:  Port removal EXAM: PORTABLE CHEST 1 VIEW COMPARISON:  None Available. FINDINGS: Stable scarring or consolidation in the left hemithorax with decreased volumes. Right lung clear. No pneumothorax. No definite pleural effusion. Aorta is ectatic. Postop changes epigastric region and right upper quadrant. Left-sided Port-A-Cath has IMPRESSION: Left hemithorax scarring or consolidation and hypoventilation. Otherwise no acute cardiopulmonary process. Electronically Signed   By: Layla Maw M.D.   On: 04/08/2022 10:52   ECHOCARDIOGRAM COMPLETE  Result Date: 04/04/2022    ECHOCARDIOGRAM REPORT   Patient Name:   [name] Date of Exam: 04/04/2022 Medical Rec #:  [id number]        Height:       68.0 in Accession #:    [id number]       Weight:       120.0 lb Date of Birth:  08-30-1950       BSA:          1.645 m Patient Age:    71 years         BP:           95/46 mmHg Patient Gender: M                HR:           91 bpm. Exam Location:  Inpatient Procedure: 2D Echo, Cardiac Doppler and Color Doppler Indications:    Bacteremia  History:        Patient has prior history of Echocardiogram examinations, most                 recent 03/20/2016. Signs/Symptoms:Fever; Risk                 Factors:Hypertension. Cancer. Pulmonary  embolus 2012.  Sonographer:    Sheralyn Boatman RDCS Referring Phys: [phone] DAVID TAT IMPRESSIONS  1. Left ventricular ejection fraction, by estimation, is 60 to 65%. Left ventricular ejection  fraction by 2D MOD biplane is 60.6 %. The left ventricle has normal function. The left ventricle has no regional wall motion abnormalities. Left ventricular diastolic parameters are consistent with Grade I diastolic dysfunction (impaired relaxation).  2. Right ventricular systolic function is normal. The right ventricular size is normal. There is mildly elevated pulmonary artery systolic pressure. The estimated right ventricular systolic pressure is 43.2 mmHg.  3. The mitral valve is grossly normal. Trivial mitral valve regurgitation.  4. The aortic valve is tricuspid. Aortic valve regurgitation is not visualized.  5. The inferior vena cava is normal in size with greater than 50% respiratory variability, suggesting right atrial pressure of 3 mmHg. Conclusion(s)/Recommendation(s): No evidence of valvular vegetations on this transthoracic echocardiogram. Consider a transesophageal echocardiogram to exclude infective endocarditis if clinically indicated. FINDINGS  Left Ventricle: Left ventricular ejection fraction, by estimation, is 60 to 65%. Left ventricular ejection fraction by 2D MOD biplane is 60.6 %. The left ventricle has normal function. The left ventricle has no regional wall motion abnormalities. The left ventricular internal cavity size was normal in size. There is no left ventricular hypertrophy. Left ventricular diastolic parameters are consistent with Grade I diastolic dysfunction (impaired relaxation). Indeterminate filling pressures. Right Ventricle: The right ventricular size is normal. No increase in right ventricular wall thickness. Right ventricular systolic function is normal. There is mildly elevated pulmonary artery systolic pressure. The tricuspid regurgitant velocity is 3.17  m/s, and with an assumed right atrial  pressure of 3 mmHg, the estimated right ventricular systolic pressure is 43.2 mmHg. Left Atrium: Left atrial size was normal in size. Right Atrium: Right atrial size was normal in size. Pericardium: There is no evidence of pericardial effusion. Mitral Valve: The mitral valve is grossly normal. Trivial mitral valve regurgitation. Tricuspid Valve: The tricuspid valve is grossly normal. Tricuspid valve regurgitation is mild. Aortic Valve: The aortic valve is tricuspid. Aortic valve regurgitation is not visualized. Pulmonic Valve: The pulmonic valve was normal in structure. Pulmonic valve regurgitation is not visualized. Aorta: The aortic root and ascending aorta are structurally normal, with no evidence of dilitation. Venous: The inferior vena cava is normal in size with greater than 50% respiratory variability, suggesting right atrial pressure of 3 mmHg. IAS/Shunts: No atrial level shunt detected by color flow Doppler.  LEFT VENTRICLE PLAX 2D                        Biplane EF (MOD) LVIDd:         3.80 cm         LV Biplane EF:   Left LVIDs:         2.40 cm                          ventricular LV PW:         1.10 cm                          ejection LV IVS:        0.80 cm                          fraction by LVOT diam:     2.30 cm                          2D MOD LV SV:  90                               biplane is LV SV Index:   55                               60.6 %. LVOT Area:     4.15 cm                                Diastology                                LV e' medial:    8.70 cm/s LV Volumes (MOD)               LV E/e' medial:  13.4 LV vol d, MOD    96.3 ml       LV e' lateral:   11.90 cm/s A2C:                           LV E/e' lateral: 9.8 LV vol d, MOD    90.4 ml A4C: LV vol s, MOD    41.7 ml A2C: LV vol s, MOD    38.0 ml A4C: LV SV MOD A2C:   54.6 ml LV SV MOD A4C:   90.4 ml LV SV MOD BP:    62.1 ml RIGHT VENTRICLE             IVC RV S prime:     16.00 cm/s  IVC diam: 1.80 cm TAPSE (M-mode): 1.8 cm  LEFT ATRIUM             Index        RIGHT ATRIUM           Index LA diam:        3.60 cm 2.19 cm/m   RA Area:     13.50 cm LA Vol (A2C):   69.4 ml 42.19 ml/m  RA Volume:   36.00 ml  21.89 ml/m LA Vol (A4C):   27.6 ml 16.78 ml/m LA Biplane Vol: 46.0 ml 27.97 ml/m  AORTIC VALVE LVOT Vmax:   135.00 cm/s LVOT Vmean:  90.700 cm/s LVOT VTI:    0.216 m  AORTA Ao Root diam: 3.10 cm Ao Asc diam:  3.60 cm MITRAL VALVE                TRICUSPID VALVE MV Area (PHT): 4.15 cm     TR Peak grad:   40.2 mmHg MV Decel Time: 183 msec     TR Vmax:        317.00 cm/s MV E velocity: 116.67 cm/s MV A velocity: 96.57 cm/s   SHUNTS MV E/A ratio:  1.21         Systemic VTI:  0.22 m                             Systemic Diam: 2.30 cm Zoila Shutter MD Electronically signed by Zoila Shutter MD Signature Date/Time: 04/04/2022/5:27:58 PM    Final    DG Chest Port 1 View  Result Date: 04/02/2022 CLINICAL DATA:  Possible sepsis EXAM: PORTABLE CHEST 1 VIEW COMPARISON:  01/08/2022 FINDINGS: Cardiac size is within normal limits. There is decreased volume in the left lung. Linear densities are seen in left mid and left lower lung fields. Surgical staples are seen in left parahilar region. Left hemidiaphragm is elevated. There is blunting of left lateral CP angle. There is no pneumothorax. Tip of left subclavian chest port is seen in superior vena cava. IMPRESSION: Postsurgical changes are noted in left hemithorax. There are no new infiltrates or signs of pulmonary edema. Electronically Signed   By: Ernie Avena M.D.   On: 04/02/2022 13:21    Vassie Loll, MD [facility]  If 7PM-7AM, please contact night-coverage [email or web address] Password [facility] 04/11/2022, 5:41 PM   LOS: 8 days

## 2022-04-12 ENCOUNTER — Encounter (HOSPITAL_COMMUNITY): Payer: Self-pay | Admitting: Surgery

## 2022-04-12 DIAGNOSIS — C252 Malignant neoplasm of tail of pancreas: Secondary | ICD-10-CM | POA: Diagnosis not present

## 2022-04-12 DIAGNOSIS — N1831 Chronic kidney disease, stage 3a: Secondary | ICD-10-CM | POA: Diagnosis not present

## 2022-04-12 DIAGNOSIS — R7881 Bacteremia: Secondary | ICD-10-CM | POA: Diagnosis not present

## 2022-04-12 DIAGNOSIS — I10 Essential (primary) hypertension: Secondary | ICD-10-CM | POA: Diagnosis not present

## 2022-04-12 LAB — CBC
HCT: 26.5 % — ABNORMAL LOW (ref 39.0–52.0)
Hemoglobin: 8.3 g/dL — ABNORMAL LOW (ref 13.0–17.0)
MCH: 30.6 pg (ref 26.0–34.0)
MCHC: 31.3 g/dL (ref 30.0–36.0)
MCV: 97.8 fL (ref 80.0–100.0)
Platelets: 210 10*3/uL (ref 150–400)
RBC: 2.71 MIL/uL — ABNORMAL LOW (ref 4.22–5.81)
RDW: 15.1 % (ref 11.5–15.5)
WBC: 6.5 10*3/uL (ref 4.0–10.5)
nRBC: 0 % (ref 0.0–0.2)

## 2022-04-12 LAB — GLUCOSE, CAPILLARY
Glucose-Capillary: 103 mg/dL — ABNORMAL HIGH (ref 70–99)
Glucose-Capillary: 101 mg/dL — ABNORMAL HIGH (ref 70–99)
Glucose-Capillary: 142 mg/dL — ABNORMAL HIGH (ref 70–99)

## 2022-04-12 LAB — COMPREHENSIVE METABOLIC PANEL
ALT: 30 U/L (ref 0–44)
AST: 20 U/L (ref 15–41)
Albumin: 1.8 g/dL — ABNORMAL LOW (ref 3.5–5.0)
Alkaline Phosphatase: 80 U/L (ref 38–126)
Anion gap: 2 — ABNORMAL LOW (ref 5–15)
BUN: 11 mg/dL (ref 8–23)
CO2: 20 mmol/L — ABNORMAL LOW (ref 22–32)
Calcium: 7.6 mg/dL — ABNORMAL LOW (ref 8.9–10.3)
Chloride: 115 mmol/L — ABNORMAL HIGH (ref 98–111)
Creatinine, Ser: 1.16 mg/dL (ref 0.61–1.24)
GFR, Estimated: >60 mL/min (ref >60)
Glucose, Bld: 118 mg/dL — ABNORMAL HIGH (ref 70–99)
Potassium: 4.4 mmol/L (ref 3.5–5.1)
Sodium: 137 mmol/L (ref 135–145)
Total Bilirubin: 0.3 mg/dL (ref 0.3–1.2)
Total Protein: 5.2 g/dL — ABNORMAL LOW (ref 6.5–8.1)

## 2022-04-12 LAB — MAGNESIUM: Magnesium: 2.2 mg/dL (ref 1.7–2.4)

## 2022-04-12 LAB — PHOSPHORUS: Phosphorus: 3.6 mg/dL (ref 2.5–4.6)

## 2022-04-12 LAB — TRIGLYCERIDES: Triglycerides: 103 mg/dL (ref <150)

## 2022-04-12 MED ORDER — APIXABAN 5 MG PO TABS
5.0000 mg | ORAL_TABLET | Freq: Two times a day (BID) | ORAL | Status: DC
  Administered 2022-04-12 – 2022-04-13 (×2): 5 mg via ORAL
  Filled 2022-04-12 (×2): qty 1

## 2022-04-12 NOTE — TOC Progression Note (Signed)
Transition of Care [facility]) - Progression Note    Patient Details  Name: [name] MRN: [id number] Date of Birth: February 19, 1951  [facility]) CM/SW Contact  Annice Needy, LCSW Phone Number: 04/12/2022, 2:54 PM  Clinical Narrative:    Elita Quick with [facility] advised that patient's TPN will be in tomorrow. Attending notified. Plan to d/c 1/23.   Expected Discharge Plan: Home w Home Health Services Barriers to Discharge: Continued Medical Work up  Expected Discharge Plan and Services In-house Referral: Clinical Social Work   Post Acute Care Choice: Resumption of Svcs/PTA Provider Living arrangements for the past 2 months: Skilled Nursing Facility                           HH Arranged: [facility] Agency: [facility] Date [facility] Agency Contacted: 04/04/22 Time HH Agency Contacted: 1151 Representative spoke with at [facility] Agency: Clydie Braun   Social Determinants of Health (SDOH) Interventions SDOH Screenings   Food Insecurity: No Food Insecurity (04/03/2022)  Housing: Low Risk  (04/03/2022)  Transportation Needs: No Transportation Needs (04/03/2022)  Utilities: Not At Risk (04/03/2022)  Tobacco Use: Medium Risk (04/12/2022)    Readmission Risk Interventions    04/04/2022   11:49 AM  Readmission Risk Prevention Plan  Transportation Screening Complete  HRI or Home Care Consult Complete  Social Work Consult for Recovery Care Planning/Counseling Complete  Palliative Care Screening Not Applicable  Medication Review Oceanographer) Complete

## 2022-04-12 NOTE — Progress Notes (Signed)
PROGRESS NOTE  [name] JNM:[id number] DOB: [birth year] DOA: 04/03/2022 PCP: Romeo Rabon, MD  Brief History:  72 year old male with a history of pancreatic adenocarcinoma, colon adenocarcinoma status post resection May 2012, adenocarcinoma of the left lung status post resection, small bowel resection status post ileorectal anastomosis with short gut syndrome, iron deficiency anemia, pulmonary embolism on apixaban, hypertension, CKD stage III, GERD, remote tobacco and alcohol use (quit 2012) presenting with fevers, chills, and rigors of 5 days duration.  The patient initially visited the emergency department on 04/02/2021 with fevers, chills, and nausea.  Blood cultures were obtained during that visit.  He was given ceftriaxone, azithromycin, and IV fluids.  His BP improved and tachycardia improved and he was discharged home in stable condition.  He was called back to come to the emergency department because of positive blood cultures.  He continued to have nausea, fevers, chills.  He complains of generalized weakness and shortness of breath.  He denies any current emesis.  He states that he has chronic diarrhea, 4-5 bowel movements daily.  He denies hematochezia but has occasional melena.  He denies any headache, neck pain, coughing, hemoptysis. Notably, the patient was recently admitted to the hospital from 01/08/2022 to 01/11/2022 due to symptomatic anemia.  He was given 2 unit PRBC.  He will underwent EGD on 01/10/2022 which showed grade a reflux esophagitis without bleeding.  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  01/11/2022 flex sig showed patent side-to-side ileal colonic anastomosis with healthy-appearing mucosa.  There was diffuse moderate inflammation at the colonic anastomosis.  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  His apixaban was restarted on 01/11/2022. In the ED, the patient had a low-grade temperature of 99.8 F.  His blood pressure was soft  with systolic blood pressures in the 100s.  Oxygen saturation was 99% on room air.  WBC 4.2, hemoglobin 10.7, platelets 24,000.  Sodium 130, potassium 3.0, bicarbonate 22, serum creatinine 1.09.  Blood cultures are showing gram-positive cocci in 2 out of 2 sets.  The patient was started on vancomycin.   Assessment/Plan: Bacteremia-CoNS -04/02/2022 blood culture--S epi and S hominis 2 out of 2 sets - 04/03/22 blood culture--S epi and S hominis -04/04/22 TTE--EF 60-65%, no vegetation -04/05/22 blood culture>>GPC -1/14 Echo EF 60-65%, G1DD, no vegetations, normal RVF -ID eval appreciated; recommending>> status post Port-A-Cath removal.  Catheter tip culture requested. -Repeat blood cultures -TEE 04/09/2022: Demonstrating no vegetations or seeding. -Following ID recommendations plan will be for daptomycin for 2 weeks; OPAT in place. -Repeat blood culture from 04/08/2022 has remained without any growth. -Patient has remained afebrile; PICC line placed without complication -Planning to resume TPN and hopefully home 04/13/2022 (unable to go home as previously planned on 04/12/2022 secondary to lack of TPN availability agency at time of discharge).  Home health orders in place.  Symptomatic anemia/melena -Once again, the patient's hemoglobin has drifted down from 11.6 on 03/17/22 -hemoglobin trend 7.7>>7.4>> latest 8.2 -GI consult appreciated>> status post endoscopy evaluation; mild erosive reflux esophagitis appreciated.  Surgical [place] stomach consistent with prior surgical history.  Small bowel at least to the proximal jejunum appear normal.  Losing mucosa at the anastomosis appreciated (status post epinephrine injection and APC).  Given patient history of pancreatic cancer unable to rule out an element of Hemosuccus pancreaticus. -Patient received transfusion of 1 unit PRBC on 04/04/22 -Continue to follow hemoglobin trend, further transfuse as needed.  (Transition threshold less than  7).. -Hemoglobin has  remained stable; no overt bleeding appreciated.  Hyponatremia -Secondary to poor solute intake and volume depletion -Continue IV normal saline -Electrolytes trend.   Hypokalemia -Continue to follow electrolytes and further replete as needed -Magnesium within normal limits.   Hypophosphatemia -repleted  -Continue to follow electrolytes trend and further replete as needed.   Pancreatic adenocarcinoma -Status post FOLFIRINOX 10/16/2021>> 12/30/2021 -Patient was started on TPN 03/19/2022 to improve his nutritional status for pancreatic tail resection -Pancreatic tail resection and splenectomy has been put on hold secondary to his poor nutritional status. -Continue outpatient follow-up with oncology service. -Resume TPN.   Iron deficiency anemia -04/20/2022 iron saturation 50 -Follow hemoglobin trend and transfuse as needed.   Pancytopenia due to antineoplastic chemotherapy (HCC) -Last FOLFIRINOX 01/01/22 -Continue to follow serial CBC -Anticipated lower hemoglobin level in the setting of acute bleed.   CKD 3a -baseline creatinine 1.0-1.3 -Appears to be stable and at baseline currently -Continue to maintain adequate hydration.   Chronic abdominal pain -Patient has chronic left-sided abdominal pain -Previously attributable to the patient's malignancy, unchanged in the past month -01/06/2022 MRI abdomen--area of restricted diffusion in the distal pancreas 2.0 x 1.9 cm.  slightly smaller mass in the splenic hilum -Continue as needed analgesics.  Overall well-controlled.   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 -Reporting no significant bowel movements currently.   HTN (hypertension) -No longer take carvedilol; blood pressure overall stable. -Heart healthy discussed with patient. -Continue to follow vital signs.   Personal history of PE (pulmonary embolism) -Patient was taking apixaban  prior to admission -Continue to hold apixaban given acute GI bleed concerns.   -After discussing with GI will resume anticoagulation. -Patient has remained without overt bleeding at this point. -Hemoglobin is stable at 8.3; continue to follow trend.    Family Communication:  spouse updated at bedside (04/09/2022).   Consultants:  GI, ID   Code Status:  FULL    DVT Prophylaxis:  SCDs     Procedures: As Listed in Progress Note Above   Antibiotics: Vanco 04/03/22>>04/10/22 Daptomycin 04/10/22   Subjective: No fever, no chest pain, no nausea, no vomiting.  In no acute distress.  Objective: Vitals:   04/11/22 1259 04/11/22 2045 04/12/22 0427 04/12/22 1605  BP: 118/75 116/74 109/70 110/69  Pulse: 86 93 88 94  Resp: 16 15 17 17   Temp: 98.5 F (36.9 C) 98.1 F (36.7 C) 98.5 F (36.9 C) 98.7 F (37.1 C)  TempSrc: Oral Oral Oral Oral  SpO2: 99% 100% 100% 100%  Weight:      Height:        Intake/Output Summary (Last 24 hours) at 04/12/2022 1738 Last data filed at 04/12/2022 1218 Gross per 24 hour  Intake 1781.57 ml  Output 350 ml  Net 1431.57 ml   Weight change:   Exam: General exam: Alert, awake, oriented x 3; febrile, no chest pain, no nausea or vomiting.  In no acute distress. Respiratory system: Clear to auscultation. Respiratory effort normal.  Good saturation on room air. Cardiovascular system: Rate controlled, no rubs, no gallops, no JVD. Gastrointestinal system: Abdomen is nondistended, soft and nontender. No organomegaly or masses felt. Normal bowel sounds heard. Central nervous system: Alert and oriented. No focal neurological deficits. Extremities: No cyanosis or clubbing. Skin: No petechiae. Psychiatry: Judgement and insight appear normal. Mood & affect appropriate.   Data Reviewed: I have personally reviewed following labs and imaging studies  Basic Metabolic Panel:  Recent Labs  Lab 04/06/22 0524 04/07/22 0402 04/08/22 0357 04/10/22 0805  04/11/22 0454 04/12/22 0443  NA 134* 136 136 138 138 137  K 4.3 4.3 4.6 3.8 4.3 4.4  CL 106 108 108 114* 114* 115*  CO2 23 26 23  17* 20* 20*  GLUCOSE 118* 120* 116* 103* 92 118*  BUN 11 12 14 12 13 11   CREATININE 1.03 0.85 0.88 1.18 1.14 1.16  CALCIUM 7.2* 7.6* 7.7* 7.3* 7.5* 7.6*  MG 1.7 2.2 1.7 1.7 1.6* 2.2  PHOS 3.1  --  4.4 2.7 3.5 3.6   Liver Function Tests: Recent Labs  Lab 04/08/22 0357 04/10/22 0805 04/11/22 0454 04/12/22 0443  AST 55* 26 22 20   ALT 49* 35 32 30  ALKPHOS 87 79 74 80  BILITOT 0.3 0.1* 0.3 0.3  PROT 4.8* 5.0* 5.0* 5.2*  ALBUMIN 1.5* 1.6* 1.6* 1.8*   Coagulation Profile: No results for input(s): "INR", "PROTIME" in the last 168 hours.  CBC: Recent Labs  Lab 04/06/22 0524 04/07/22 0402 04/12/22 0746  WBC 7.9 7.8 6.5  HGB 8.2* 8.2* 8.3*  HCT 24.7* 24.9* 26.5*  MCV 93.2 92.2 97.8  PLT 96* 124* 210   CBG: Recent Labs  Lab 04/10/22 2324 04/11/22 0506 04/12/22 0747 04/12/22 1239 04/12/22 1729  GLUCAP 93 90 103* 101* 142*   Urine analysis:    Component Value Date/Time   COLORURINE YELLOW 04/02/2022 1533   APPEARANCEUR HAZY (A) 04/02/2022 1533   LABSPEC 1.013 04/02/2022 1533   PHURINE 5.0 04/02/2022 1533   GLUCOSEU NEGATIVE 04/02/2022 1533   HGBUR NEGATIVE 04/02/2022 1533   BILIRUBINUR NEGATIVE 04/02/2022 1533   KETONESUR NEGATIVE 04/02/2022 1533   PROTEINUR 30 (A) 04/02/2022 1533   UROBILINOGEN 0.2 11/30/2014 1115   NITRITE NEGATIVE 04/02/2022 1533   LEUKOCYTESUR NEGATIVE 04/02/2022 1533   Sepsis Labs:  Recent Results (from the past 240 hour(s))  Blood culture (routine x 2)     Status: Abnormal   Collection Time: 04/03/22  4:23 PM   Specimen: BLOOD  Result Value Ref Range Status   Specimen Description BLOOD LEFT ANTECUBITAL  Final   Special Requests   Final    BOTTLES DRAWN AEROBIC AND ANAEROBIC Blood Culture adequate volume   Culture  Setup Time   Final    GRAM POSITIVE COCCI Gram Stain Report Called to,Read Back By and  Verified With: T. LEONARD  @ 0944 BY STEPHTR  04/04/22 IN BOTH AEROBIC AND ANAEROBIC BOTTLES CRITICAL VALUE NOTED.  VALUE IS CONSISTENT WITH PREVIOUSLY REPORTED AND CALLED VALUE. Performed at [facility], [address], [place], [facility] [address]    Culture (A)  Final    STAPHYLOCOCCUS EPIDERMIDIS STAPHYLOCOCCUS HOMINIS STAPHYLOCOCCUS HOMINIS SUSCEPTIBILITIES PERFORMED ON PREVIOUS CULTURE WITHIN THE LAST 5 DAYS.    Report Status 04/07/2022 FINAL  Final   Organism ID, Bacteria STAPHYLOCOCCUS EPIDERMIDIS  Final      Susceptibility   Staphylococcus epidermidis - MIC*    CIPROFLOXACIN <=0.5 SENSITIVE Sensitive     ERYTHROMYCIN >=8 RESISTANT Resistant     GENTAMICIN <=0.5 SENSITIVE Sensitive     OXACILLIN >=4 RESISTANT Resistant     TETRACYCLINE 2 SENSITIVE Sensitive     VANCOMYCIN 2 SENSITIVE Sensitive     TRIMETH/SULFA <=10 SENSITIVE Sensitive     CLINDAMYCIN <=0.25 SENSITIVE Sensitive     RIFAMPIN <=0.5 SENSITIVE Sensitive     Inducible Clindamycin NEGATIVE Sensitive     * STAPHYLOCOCCUS EPIDERMIDIS  Blood culture (routine x 2)  Status: Abnormal   Collection Time: 04/03/22  4:28 PM   Specimen: BLOOD RIGHT HAND  Result Value Ref Range Status   Specimen Description BLOOD RIGHT HAND  Final   Special Requests   Final    BOTTLES DRAWN AEROBIC AND ANAEROBIC Blood Culture adequate volume   Culture  Setup Time   Final    GRAM POSITIVE COCCI Gram Stain Report Called to,Read Back By and Verified With:  T. LEONARD @ 0944 BY STEPHTR 04/04/22 IN BOTH AEROBIC AND ANAEROBIC BOTTLES Organism ID to follow CRITICAL RESULT CALLED TO, READ BACK BY AND VERIFIED WITH:  Perlie Gold, RN 04/04/22 1552 A. LAFRANCE Performed at [facility], [address] [place], [place] [address]    Culture (A)  Final    STAPHYLOCOCCUS EPIDERMIDIS STAPHYLOCOCCUS HOMINIS STAPHYLOCOCCUS HOMINIS SUSCEPTIBILITIES PERFORMED ON PREVIOUS CULTURE WITHIN THE LAST 5 DAYS.    Report Status 04/07/2022  FINAL  Final   Organism ID, Bacteria STAPHYLOCOCCUS EPIDERMIDIS  Final      Susceptibility   Staphylococcus epidermidis - MIC*    CIPROFLOXACIN <=0.5 SENSITIVE Sensitive     ERYTHROMYCIN >=8 RESISTANT Resistant     GENTAMICIN <=0.5 SENSITIVE Sensitive     OXACILLIN >=4 RESISTANT Resistant     TETRACYCLINE 2 SENSITIVE Sensitive     VANCOMYCIN 2 SENSITIVE Sensitive     TRIMETH/SULFA <=10 SENSITIVE Sensitive     CLINDAMYCIN <=0.25 SENSITIVE Sensitive     RIFAMPIN <=0.5 SENSITIVE Sensitive     Inducible Clindamycin NEGATIVE Sensitive     * STAPHYLOCOCCUS EPIDERMIDIS  Blood Culture ID Panel (Reflexed)     Status: Abnormal   Collection Time: 04/03/22  4:28 PM  Result Value Ref Range Status   Enterococcus faecalis NOT DETECTED NOT DETECTED Final   Enterococcus Faecium NOT DETECTED NOT DETECTED Final   Listeria monocytogenes NOT DETECTED NOT DETECTED Final   Staphylococcus species DETECTED (A) NOT DETECTED Final    Comment: CRITICAL RESULT CALLED TO, READ BACK BY AND VERIFIED WITH:  Perlie Gold, RN 04/04/22 1552 A. LAFRANCE    Staphylococcus aureus (BCID) NOT DETECTED NOT DETECTED Final   Staphylococcus epidermidis DETECTED (A) NOT DETECTED Final    Comment: Methicillin (oxacillin) resistant coagulase negative staphylococcus. Possible blood culture contaminant (unless isolated from more than one blood culture draw or clinical case suggests pathogenicity). No antibiotic treatment is indicated for blood  culture contaminants. CRITICAL RESULT CALLED TO, READ BACK BY AND VERIFIED WITH:  Perlie Gold, RN 04/04/22 1552 A. LAFRANCE    Staphylococcus lugdunensis NOT DETECTED NOT DETECTED Final   Streptococcus species NOT DETECTED NOT DETECTED Final   Streptococcus agalactiae NOT DETECTED NOT DETECTED Final   Streptococcus pneumoniae NOT DETECTED NOT DETECTED Final   Streptococcus pyogenes NOT DETECTED NOT DETECTED Final   A.calcoaceticus-baumannii NOT DETECTED NOT DETECTED Final    Bacteroides fragilis NOT DETECTED NOT DETECTED Final   Enterobacterales NOT DETECTED NOT DETECTED Final   Enterobacter cloacae complex NOT DETECTED NOT DETECTED Final   Escherichia coli NOT DETECTED NOT DETECTED Final   Klebsiella aerogenes NOT DETECTED NOT DETECTED Final   Klebsiella oxytoca NOT DETECTED NOT DETECTED Final   Klebsiella pneumoniae NOT DETECTED NOT DETECTED Final   Proteus species NOT DETECTED NOT DETECTED Final   Salmonella species NOT DETECTED NOT DETECTED Final   Serratia marcescens NOT DETECTED NOT DETECTED Final   Haemophilus influenzae NOT DETECTED NOT DETECTED Final   Neisseria meningitidis NOT DETECTED NOT DETECTED Final   Pseudomonas aeruginosa NOT DETECTED NOT  DETECTED Final   Stenotrophomonas maltophilia NOT DETECTED NOT DETECTED Final   Candida albicans NOT DETECTED NOT DETECTED Final   Candida auris NOT DETECTED NOT DETECTED Final   Candida glabrata NOT DETECTED NOT DETECTED Final   Candida krusei NOT DETECTED NOT DETECTED Final   Candida parapsilosis NOT DETECTED NOT DETECTED Final   Candida tropicalis NOT DETECTED NOT DETECTED Final   Cryptococcus neoformans/gattii NOT DETECTED NOT DETECTED Final   Methicillin resistance mecA/C DETECTED (A) NOT DETECTED Final    Comment: CRITICAL RESULT CALLED TO, READ BACK BY AND VERIFIED WITH:  Perlie Gold, RN 04/04/22 1552 A. LAFRANCE Performed at [facility], [address], [place], [place] [address]   Culture, blood (Routine X 2) w Reflex to ID Panel     Status: Abnormal   Collection Time: 04/04/22 10:20 AM   Specimen: BLOOD RIGHT ARM  Result Value Ref Range Status   Specimen Description   Final    BLOOD RIGHT ARM BOTTLES DRAWN AEROBIC AND ANAEROBIC   Special Requests   Final    Blood Culture results may not be optimal due to an excessive volume of blood received in culture bottles   Culture  Setup Time   Final    GRAM POSITIVE COCCI ANAEROBIC BOTTLE ONLY Gram Stain Report Called to,Read Back By and  Verified With: BASS V. AT 1513 ON [id number] BY THOMPSON S. CRITICAL VALUE NOTED.  VALUE IS CONSISTENT WITH PREVIOUSLY REPORTED AND CALLED VALUE.    Culture (A)  Final    STAPHYLOCOCCUS EPIDERMIDIS SUSCEPTIBILITIES PERFORMED ON PREVIOUS CULTURE WITHIN THE LAST 5 DAYS.    Report Status 04/09/2022 FINAL  Final  Culture, blood (Routine X 2) w Reflex to ID Panel     Status: None   Collection Time: 04/04/22 10:20 AM   Specimen: BLOOD RIGHT HAND  Result Value Ref Range Status   Specimen Description   Final    BLOOD RIGHT HAND BOTTLES DRAWN AEROBIC AND ANAEROBIC   Special Requests   Final    Blood Culture results may not be optimal due to an excessive volume of blood received in culture bottles   Culture   Final    NO GROWTH 5 DAYS Performed at [facility], [address] [place], [place] [address]    Report Status 04/09/2022 FINAL  Final  Gastrointestinal Panel by PCR , Stool     Status: None   Collection Time: 04/04/22  2:24 PM  Result Value Ref Range Status   Campylobacter species NOT DETECTED NOT DETECTED Final   Plesimonas shigelloides NOT DETECTED NOT DETECTED Final   Salmonella species NOT DETECTED NOT DETECTED Final   Yersinia enterocolitica NOT DETECTED NOT DETECTED Final   Vibrio species NOT DETECTED NOT DETECTED Final   Vibrio cholerae NOT DETECTED NOT DETECTED Final   Enteroaggregative E coli (EAEC) NOT DETECTED NOT DETECTED Final   Enteropathogenic E coli (EPEC) NOT DETECTED NOT DETECTED Final   Enterotoxigenic E coli (ETEC) NOT DETECTED NOT DETECTED Final   Shiga like toxin producing E coli (STEC) NOT DETECTED NOT DETECTED Final   Shigella/Enteroinvasive E coli (EIEC) NOT DETECTED NOT DETECTED Final   Cryptosporidium NOT DETECTED NOT DETECTED Final   Cyclospora cayetanensis NOT DETECTED NOT DETECTED Final   Entamoeba histolytica NOT DETECTED NOT DETECTED Final   Giardia lamblia NOT DETECTED NOT DETECTED Final   Adenovirus F40/41 NOT DETECTED NOT DETECTED Final   Astrovirus  NOT DETECTED NOT DETECTED Final   Norovirus GI/GII NOT DETECTED NOT DETECTED  Final   Rotavirus A NOT DETECTED NOT DETECTED Final   Sapovirus (I, II, IV, and V) NOT DETECTED NOT DETECTED Final    Comment: Performed at [facility], [address] [place], [place] [address]  Culture, blood (single) w Reflex to ID Panel     Status: Abnormal   Collection Time: 04/05/22  4:30 PM   Specimen: Right Antecubital; Blood  Result Value Ref Range Status   Specimen Description   Final    RIGHT ANTECUBITAL Performed at [facility], [address] [place], [place] [address]    Special Requests   Final    BOTTLES DRAWN AEROBIC AND ANAEROBIC Blood Culture adequate volume Performed at [facility], [address], [place], [place] [address]    Culture  Setup Time   Final    GRAM POSITIVE COCCI Gram Stain Report Called to,Read Back By and Verified With: MUSE,[email or web address]  BY MATTHEWS, B 1.16.2024 IN BOTH AEROBIC AND ANAEROBIC BOTTLES CRITICAL VALUE NOTED.  VALUE IS CONSISTENT WITH PREVIOUSLY REPORTED AND CALLED VALUE.    Culture (A)  Final    STAPHYLOCOCCUS EPIDERMIDIS SUSCEPTIBILITIES PERFORMED ON PREVIOUS CULTURE WITHIN THE LAST 5 DAYS. Performed at [facility], [place] [address] [place], [place] [address]    Report Status 04/08/2022 FINAL  Final  Culture, blood (Routine X 2) w Reflex to ID Panel     Status: None (Preliminary result)   Collection Time: 04/08/22  9:28 AM   Specimen: BLOOD  Result Value Ref Range Status   Specimen Description BLOOD RIGHT ANTECUBITAL  Final   Special Requests   Final    BOTTLES DRAWN AEROBIC AND ANAEROBIC Blood Culture adequate volume   Culture   Final    NO GROWTH 4 DAYS Performed at [facility], [address], [place], [place] [address]    Report Status PENDING  Incomplete  Culture, blood (Routine X 2) w Reflex to ID Panel     Status: None (Preliminary result)   Collection Time: 04/08/22  9:33 AM   Specimen: BLOOD  Result Value Ref Range Status    Specimen Description BLOOD BLOOD LEFT HAND  Final   Special Requests   Final    BOTTLES DRAWN AEROBIC AND ANAEROBIC Blood Culture adequate volume   Culture   Final    NO GROWTH 4 DAYS Performed at [facility], [address], [place], [place] [address]    Report Status PENDING  Incomplete  Cath Tip Culture     Status: Abnormal (Preliminary result)   Collection Time: 04/08/22 10:10 AM   Specimen: Catheter Tip; Other  Result Value Ref Range Status   Specimen Description   Final    PORTA CATH Performed at [facility], [place] [address], [place], [place] [address]    Special Requests   Final    NONE Performed at [facility], [address] [place], [place] [address]    Culture (A)  Final    >=100,000 COLONIES/mL STAPHYLOCOCCUS EPIDERMIDIS 20,000 COLONIES/mL STAPHYLOCOCCUS HOMINIS    Report Status PENDING  Incomplete   Organism ID, Bacteria STAPHYLOCOCCUS EPIDERMIDIS (A)  Final      Susceptibility   Staphylococcus epidermidis - MIC*    CIPROFLOXACIN <=0.5 SENSITIVE Sensitive     ERYTHROMYCIN >=8 RESISTANT Resistant     GENTAMICIN <=0.5 SENSITIVE Sensitive     OXACILLIN >=4 RESISTANT Resistant     TETRACYCLINE 2 SENSITIVE Sensitive     VANCOMYCIN 2 SENSITIVE Sensitive     TRIMETH/SULFA <=10 SENSITIVE Sensitive  CLINDAMYCIN <=0.25 SENSITIVE Sensitive     RIFAMPIN <=0.5 SENSITIVE Sensitive     Inducible Clindamycin NEGATIVE Sensitive     * >=100,000 COLONIES/mL STAPHYLOCOCCUS EPIDERMIDIS  Culture, blood (Routine X 2) w Reflex to ID Panel     Status: None (Preliminary result)   Collection Time: 04/09/22  3:52 PM   Specimen: Right Antecubital; Blood  Result Value Ref Range Status   Specimen Description   Final    RIGHT ANTECUBITAL BOTTLES DRAWN AEROBIC AND ANAEROBIC   Special Requests Blood Culture adequate volume  Final   Culture   Final    NO GROWTH 3 DAYS Performed at [facility], [address] [place], [place] [address]    Report Status PENDING  Incomplete   Culture, blood (Routine X 2) w Reflex to ID Panel     Status: None (Preliminary result)   Collection Time: 04/09/22  3:52 PM   Specimen: BLOOD RIGHT HAND  Result Value Ref Range Status   Specimen Description   Final    BLOOD RIGHT HAND BOTTLES DRAWN AEROBIC AND ANAEROBIC   Special Requests Blood Culture adequate volume  Final   Culture   Final    NO GROWTH 3 DAYS Performed at [facility], [address], [place], [place] [address]    Report Status PENDING  Incomplete     Scheduled Meds:  acidophilus  1 capsule Oral Daily   apixaban  5 mg Oral BID   atorvastatin  20 mg Oral Daily   bacitracin   Topical Daily   Chlorhexidine Gluconate Cloth  6 each Topical Daily   cholestyramine light  4 g Oral Daily   dicyclomine  10 mg Oral TID AC & HS   diphenoxylate-atropine  1 tablet Oral QID   escitalopram  20 mg Oral Daily   feeding supplement  237 mL Oral TID BM   gabapentin  100 mg Oral QHS   insulin aspart  0-9 Units Subcutaneous Q8H   lipase/protease/amylase  108,000 Units Oral TID WC   megestrol  400 mg Oral BID   mirtazapine  30 mg Oral QHS   pantoprazole  40 mg Oral BID AC   sodium chloride flush  10-40 mL Intracatheter Q12H   Continuous Infusions:  DAPTOmycin (CUBICIN) 450 mg in sodium chloride 0.9 % IVPB 118 mL/hr at 04/12/22 1218   TPN ADULT (ION) 40 mL/hr at 04/12/22 1218    Procedures/Studies: [place] EKG SITE RITE  Result Date: 04/10/2022 If Site Rite image not attached, placement could not be confirmed due to current cardiac rhythm.  ECHO TEE  Result Date: 04/09/2022    TRANSESOPHOGEAL ECHO REPORT   Patient Name:   MICKAEL MCNUTT Date of Exam: 04/09/2022 Medical Rec #:  [id number]        Height:       68.0 in Accession #:    [id number]       Weight:       119.9 lb Date of Birth:  09-18-1950       BSA:          1.644 m Patient Age:    71 years         BP:           130/80 mmHg Patient Gender: M                HR:           78 bpm. [facility] Procedure:  Transesophageal Echo, Color Doppler and  Cardiac Doppler Indications:    Bacteremia  History:        Patient has prior history of Echocardiogram examinations, most                 recent 04/04/2022. Signs/Symptoms:Chest Pain; Risk                 Factors:Hypertension and Dyslipidemia. CKD, Colon CA.  Sonographer:    Mikki [name] Referring Phys: [id number] Dorothe Pea BRANCH PROCEDURE: The transesophogeal probe was passed without difficulty through the esophogus of the patient. Sedation performed by different physician. The patient developed no complications during the procedure.  IMPRESSIONS  1. Left ventricular ejection fraction, by estimation, is 60 to 65%. The left ventricle has normal function.  2. Right ventricular systolic function is normal. The right ventricular size is normal.  3. No left atrial/left atrial appendage thrombus was detected. The LAA emptying velocity was 97 cm/s.  4. The mitral valve is normal in structure. No evidence of mitral valve regurgitation. No evidence of mitral stenosis.  5. The aortic valve is tricuspid. Aortic valve regurgitation is not visualized. No aortic stenosis is present. Conclusion(s)/Recommendation(s): No evidence of vegetation/infective endocarditis on this transesophageael echocardiogram. FINDINGS  Left Ventricle: Left ventricular ejection fraction, by estimation, is 60 to 65%. The left ventricle has normal function. The left ventricular internal cavity size was normal in size. Right Ventricle: The right ventricular size is normal. Right vetricular wall thickness was not well visualized. Right ventricular systolic function is normal. Left Atrium: Left atrial size was normal in size. No left atrial/left atrial appendage thrombus was detected. The LAA emptying velocity was 97 cm/s. Right Atrium: Right atrial size was normal in size. Pericardium: There is no evidence of pericardial effusion. Mitral Valve: The mitral valve is normal in structure. No evidence of mitral valve  regurgitation. No evidence of mitral valve stenosis. Tricuspid Valve: The tricuspid valve is normal in structure. Tricuspid valve regurgitation is mild . No evidence of tricuspid stenosis. Aortic Valve: The aortic valve is tricuspid. Aortic valve regurgitation is not visualized. No aortic stenosis is present. Pulmonic Valve: The pulmonic valve was not well visualized. Pulmonic valve regurgitation is not visualized. No evidence of pulmonic stenosis. Aorta: The aortic root is normal in size and structure. IAS/Shunts: No atrial level shunt detected by color flow Doppler.   AORTA Ao Root diam: 3.50 cm Dina Rich MD Electronically signed by Dina Rich MD Signature Date/Time: 04/09/2022/2:49:02 PM    Final    DG Chest Port 1 View  Result Date: 04/08/2022 CLINICAL DATA:  Port removal EXAM: PORTABLE CHEST 1 VIEW COMPARISON:  None Available. FINDINGS: Stable scarring or consolidation in the left hemithorax with decreased volumes. Right lung clear. No pneumothorax. No definite pleural effusion. Aorta is ectatic. Postop changes epigastric region and right upper quadrant. Left-sided Port-A-Cath has IMPRESSION: Left hemithorax scarring or consolidation and hypoventilation. Otherwise no acute cardiopulmonary process. Electronically Signed   By: Layla Maw M.D.   On: 04/08/2022 10:52   ECHOCARDIOGRAM COMPLETE  Result Date: 04/04/2022    ECHOCARDIOGRAM REPORT   Patient Name:   AMOL DOMANSKI Date of Exam: 04/04/2022 Medical Rec #:  [id number]        Height:       68.0 in Accession #:    [id number]       Weight:       120.0 lb Date of Birth:  [birth year]       BSA:  1.645 m Patient Age:    71 years         BP:           95/46 mmHg Patient Gender: M                HR:           91 bpm. Exam Location:  Inpatient Procedure: 2D Echo, Cardiac Doppler and Color Doppler Indications:    Bacteremia  History:        Patient has prior history of Echocardiogram examinations, most                 recent 03/20/2016.  Signs/Symptoms:Fever; Risk                 Factors:Hypertension. Cancer. Pulmonary embolus 2012.  Sonographer:    Sheralyn Boatman RDCS Referring Phys: [phone] DAVID TAT IMPRESSIONS  1. Left ventricular ejection fraction, by estimation, is 60 to 65%. Left ventricular ejection fraction by 2D MOD biplane is 60.6 %. The left ventricle has normal function. The left ventricle has no regional wall motion abnormalities. Left ventricular diastolic parameters are consistent with Grade I diastolic dysfunction (impaired relaxation).  2. Right ventricular systolic function is normal. The right ventricular size is normal. There is mildly elevated pulmonary artery systolic pressure. The estimated right ventricular systolic pressure is 43.2 mmHg.  3. The mitral valve is grossly normal. Trivial mitral valve regurgitation.  4. The aortic valve is tricuspid. Aortic valve regurgitation is not visualized.  5. The inferior vena cava is normal in size with greater than 50% respiratory variability, suggesting right atrial pressure of 3 mmHg. Conclusion(s)/Recommendation(s): No evidence of valvular vegetations on this transthoracic echocardiogram. Consider a transesophageal echocardiogram to exclude infective endocarditis if clinically indicated. FINDINGS  Left Ventricle: Left ventricular ejection fraction, by estimation, is 60 to 65%. Left ventricular ejection fraction by 2D MOD biplane is 60.6 %. The left ventricle has normal function. The left ventricle has no regional wall motion abnormalities. The left ventricular internal cavity size was normal in size. There is no left ventricular hypertrophy. Left ventricular diastolic parameters are consistent with Grade I diastolic dysfunction (impaired relaxation). Indeterminate filling pressures. Right Ventricle: The right ventricular size is normal. No increase in right ventricular wall thickness. Right ventricular systolic function is normal. There is mildly elevated pulmonary artery systolic pressure.  The tricuspid regurgitant velocity is 3.17  m/s, and with an assumed right atrial pressure of 3 mmHg, the estimated right ventricular systolic pressure is 43.2 mmHg. Left Atrium: Left atrial size was normal in size. Right Atrium: Right atrial size was normal in size. Pericardium: There is no evidence of pericardial effusion. Mitral Valve: The mitral valve is grossly normal. Trivial mitral valve regurgitation. Tricuspid Valve: The tricuspid valve is grossly normal. Tricuspid valve regurgitation is mild. Aortic Valve: The aortic valve is tricuspid. Aortic valve regurgitation is not visualized. Pulmonic Valve: The pulmonic valve was normal in structure. Pulmonic valve regurgitation is not visualized. Aorta: The aortic root and ascending aorta are structurally normal, with no evidence of dilitation. Venous: The inferior vena cava is normal in size with greater than 50% respiratory variability, suggesting right atrial pressure of 3 mmHg. IAS/Shunts: No atrial level shunt detected by color flow Doppler.  LEFT VENTRICLE PLAX 2D                        Biplane EF (MOD) LVIDd:         3.80 cm  LV Biplane EF:   Left LVIDs:         2.40 cm                          ventricular LV PW:         1.10 cm                          ejection LV IVS:        0.80 cm                          fraction by LVOT diam:     2.30 cm                          2D MOD LV SV:         90                               biplane is LV SV Index:   55                               60.6 %. LVOT Area:     4.15 cm                                Diastology                                LV e' medial:    8.70 cm/s LV Volumes (MOD)               LV E/e' medial:  13.4 LV vol d, MOD    96.3 ml       LV e' lateral:   11.90 cm/s A2C:                           LV E/e' lateral: 9.8 LV vol d, MOD    90.4 ml A4C: LV vol s, MOD    41.7 ml A2C: LV vol s, MOD    38.0 ml A4C: LV SV MOD A2C:   54.6 ml LV SV MOD A4C:   90.4 ml LV SV MOD BP:    62.1 ml RIGHT VENTRICLE              IVC RV S prime:     16.00 cm/s  IVC diam: 1.80 cm TAPSE (M-mode): 1.8 cm LEFT ATRIUM             Index        RIGHT ATRIUM           Index LA diam:        3.60 cm 2.19 cm/m   RA Area:     13.50 cm LA Vol (A2C):   69.4 ml 42.19 ml/m  RA Volume:   36.00 ml  21.89 ml/m LA Vol (A4C):   27.6 ml 16.78 ml/m LA Biplane Vol: 46.0 ml 27.97 ml/m  AORTIC VALVE LVOT Vmax:   135.00 cm/s LVOT Vmean:  90.700 cm/s LVOT VTI:    0.216 m  AORTA Ao Root diam: 3.10 cm Ao  Asc diam:  3.60 cm MITRAL VALVE                TRICUSPID VALVE MV Area (PHT): 4.15 cm     TR Peak grad:   40.2 mmHg MV Decel Time: 183 msec     TR Vmax:        317.00 cm/s MV E velocity: 116.67 cm/s MV A velocity: 96.57 cm/s   SHUNTS MV E/A ratio:  1.21         Systemic VTI:  0.22 m                             Systemic Diam: 2.30 cm Zoila Shutter MD Electronically signed by Zoila Shutter MD Signature Date/Time: 04/04/2022/5:27:58 PM    Final    DG Chest Port 1 View  Result Date: 04/02/2022 CLINICAL DATA:  Possible sepsis EXAM: PORTABLE CHEST 1 VIEW COMPARISON:  01/08/2022 FINDINGS: Cardiac size is within normal limits. There is decreased volume in the left lung. Linear densities are seen in left mid and left lower lung fields. Surgical staples are seen in left parahilar region. Left hemidiaphragm is elevated. There is blunting of left lateral CP angle. There is no pneumothorax. Tip of left subclavian chest port is seen in superior vena cava. IMPRESSION: Postsurgical changes are noted in left hemithorax. There are no new infiltrates or signs of pulmonary edema. Electronically Signed   By: Ernie Avena M.D.   On: 04/02/2022 13:21    Vassie Loll, MD [facility]  If 7PM-7AM, please contact night-coverage [email or web address] Password [facility] 04/12/2022, 5:38 PM   LOS: 9 days

## 2022-04-12 NOTE — Progress Notes (Signed)
Left upper chest port removal site cleansed, bacitracin applied, gauze and tegaderm applied. Dressing and care provided as ordered.

## 2022-04-12 NOTE — Care Management Important Message (Signed)
Important Message  Patient Details  Name: [name] MRN: [id number] Date of Birth: 11-17-1950   Medicare Important Message Given:  Yes     Corey Harold 04/12/2022, 12:33 PM

## 2022-04-13 ENCOUNTER — Encounter (HOSPITAL_COMMUNITY): Payer: Self-pay | Admitting: Internal Medicine

## 2022-04-13 DIAGNOSIS — C252 Malignant neoplasm of tail of pancreas: Secondary | ICD-10-CM | POA: Diagnosis not present

## 2022-04-13 DIAGNOSIS — R7881 Bacteremia: Secondary | ICD-10-CM | POA: Diagnosis not present

## 2022-04-13 DIAGNOSIS — Z903 Acquired absence of stomach [part of]: Secondary | ICD-10-CM | POA: Diagnosis not present

## 2022-04-13 DIAGNOSIS — Z86711 Personal history of pulmonary embolism: Secondary | ICD-10-CM | POA: Diagnosis not present

## 2022-04-13 LAB — GLUCOSE, CAPILLARY
Glucose-Capillary: 85 mg/dL (ref 70–99)
Glucose-Capillary: 93 mg/dL (ref 70–99)
Glucose-Capillary: 94 mg/dL (ref 70–99)

## 2022-04-13 LAB — CATH TIP CULTURE: Culture: >=100000 — AB

## 2022-04-13 MED ORDER — DAPTOMYCIN IV (FOR PTA / DISCHARGE USE ONLY): 450.0000 mg | INTRAVENOUS | 0 refills | Status: AC

## 2022-04-13 MED ORDER — ENSURE ENLIVE PO LIQD: 237.0000 mL | Freq: Two times a day (BID) | ORAL | Status: DC

## 2022-04-13 MED ORDER — ATORVASTATIN CALCIUM 20 MG PO TABS: 20.0000 mg | ORAL_TABLET | Freq: Every day | ORAL | Status: DC

## 2022-04-13 MED ORDER — BACITRACIN ZINC 500 UNIT/GM EX OINT: TOPICAL_OINTMENT | Freq: Every day | CUTANEOUS | 0 refills | Status: DC

## 2022-04-13 NOTE — Care Management Important Message (Signed)
Important Message  Patient Details  Name: [name] MRN: [id number] Date of Birth: [birth year]   Medicare Important Message Given:  Yes     Corey Harold 04/13/2022, 10:41 AM

## 2022-04-13 NOTE — Discharge Summary (Signed)
Physician Discharge Summary   Patient: [name] MRN: [id number] DOB: May 19, 1950  Admit date:     04/03/2022  Discharge date: 04/13/22  Discharge Physician: Barton Dubois   PCP: [name], MD   Recommendations at discharge:  Repeat CBC to follow hemoglobin trend/stability Repeat complete metabolic panel to follow electrolytes, renal function and LFTs. Continue patient follow-up with oncology service.  Discharge Diagnoses: Principal Problem:   Bacteremia Active Problems:   Pancytopenia due to antineoplastic chemotherapy ([place])   Adenocarcinoma of colon with mucinous features   Anemia, chronic disease   Fever with chills   S/P partial gastrectomy   Personal history of PE (pulmonary embolism)   HTN (hypertension)   CKD (chronic kidney disease) stage 3, GFR 30-59 ml/min (HCC)   Dyslipidemia   B12 deficiency   Pancreatic cancer [facility])   Melena  Hospital Course: 72 year old male with a history of pancreatic adenocarcinoma, colon adenocarcinoma status post resection May 2012, adenocarcinoma of the left lung status post resection, small bowel resection status post ileorectal anastomosis with short gut syndrome, iron deficiency anemia, pulmonary embolism on apixaban, hypertension, CKD stage III, GERD, remote tobacco and alcohol use (quit 2012) presenting with fevers, chills, and rigors of 5 days duration.  The patient initially visited the emergency department on 04/02/2021 with fevers, chills, and nausea.  Blood cultures were obtained during that visit.  He was given ceftriaxone, azithromycin, and IV fluids.  His BP improved and tachycardia improved and he was discharged home in stable condition.  He was called back to come to the emergency department because of positive blood cultures.  He continued to have nausea, fevers, chills.  He complains of generalized weakness and shortness of breath.  He denies any current emesis.  He states that he has chronic diarrhea, 4-5 bowel  movements daily.  He denies hematochezia but has occasional melena.  He denies any headache, neck pain, coughing, hemoptysis. Notably, the patient was recently admitted to the hospital from 01/08/2022 to 01/11/2022 due to symptomatic anemia.  He was given 2 unit PRBC.  He will underwent EGD on 01/10/2022 which showed grade a reflux esophagitis without bleeding.  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  01/11/2022 flex sig showed patent side-to-side ileal colonic anastomosis with healthy-appearing mucosa.  There was diffuse moderate inflammation at the colonic anastomosis.  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  His apixaban was restarted on 01/11/2022. In the ED, the patient had a low-grade temperature of 99.8 F.  His blood pressure was soft with systolic blood pressures in the 100s.  Oxygen saturation was 99% on room air.  WBC 4.2, hemoglobin 10.7, platelets 24,000.  Sodium 130, potassium 3.0, bicarbonate 22, serum creatinine 1.09.  Blood cultures are showing gram-positive cocci in 2 out of 2 sets.  The patient was started on vancomycin.  Assessment and Plan: Bacteremia-CoNS -04/02/2022 blood culture--S epi and S hominis 2 out of 2 sets - 04/03/22 blood culture--S epi and S hominis -04/04/22 TTE--EF 60-65%, no vegetation -04/05/22 blood culture>>GPC -1/14 Echo EF 60-65%, G1DD, no vegetations, normal RVF -ID eval appreciated; recommending>> status post Port-A-Cath removal.  Catheter tip culture requested. -Repeat blood cultures -TEE 04/09/2022: Demonstrating no vegetations or seeding. -Following ID recommendations plan will be for daptomycin for 2 weeks; OPAT in place.  (Last antibiotic day 04/22/2022). -Repeat blood culture from 04/08/2022 has remained without any growth. -Patient has remained afebrile; PICC line placed without complications. -Planning to resume TPN and hopefully home 04/13/2022 (unable to go home  as previously planned on 04/12/2022 secondary to lack of TPN  availability agency at time of discharge).  Home health orders in place.   Symptomatic anemia/melena -Once again, the patient's hemoglobin has drifted down from 11.6 on 03/17/22 -hemoglobin trend 7.7>>7.4>> latest 8.2 -GI consult appreciated>> status post endoscopy evaluation; mild erosive reflux esophagitis appreciated.  Surgical [place] stomach consistent with prior surgical history.  Small bowel at least to the proximal jejunum appear normal.  Losing mucosa at the anastomosis appreciated (status post epinephrine injection and APC).  Given patient history of pancreatic cancer unable to rule out an element of Hemosuccus pancreaticus. -Patient received transfusion of 1 unit PRBC on 04/04/22 -Continue to follow hemoglobin trend, further transfuse as needed.  (Transition threshold for hemoglobin less than 7).. -Hemoglobin has remained stable; no overt bleeding appreciated.   Hyponatremia -Secondary to poor solute intake and volume depletion -Stable at time of discharge; continue to follow electrolytes trend -Patient advised to maintain adequate hydration.   Hypokalemia -Repleted and Stable -Continue to follow electrolytes and further replete as needed.   Hypophosphatemia -repleted  -Continue to follow electrolytes trend and further replete as needed.   Pancreatic adenocarcinoma -Status post FOLFIRINOX 10/16/2021>> 12/30/2021 -Patient was started on TPN 03/19/2022 to improve his nutritional status for pancreatic tail resection -Pancreatic tail resection and splenectomy has been put on hold secondary to his poor nutritional status. -Continue outpatient follow-up with oncology service. -Resume TPN as an outpatient..   Iron deficiency anemia -04/20/2022 iron saturation 50 -Continue to follow hemoglobin trend and transfuse as needed.   Pancytopenia due to antineoplastic chemotherapy [facility]) -Last FOLFIRINOX 01/01/22 -Continue to follow serial CBC -Hemoglobin has remained stable at discharge;  anticoagulation has been resumed following GI service recommendations. -Continue patient follow-up with oncology service.   CKD 3a -baseline creatinine 1.0-1.3 -Appears to be stable and at baseline currently -Continue to maintain adequate hydration. -Continue to follow renal function trend and electrolytes stability at follow-up visit.   Chronic abdominal pain -Patient has chronic left-sided abdominal pain -Previously attributable to the patient's malignancy, unchanged in the past month -01/06/2022 MRI abdomen--area of restricted diffusion in the distal pancreas 2.0 x 1.9 cm.  slightly smaller mass in the splenic hilum -Continue as needed analgesics.  Overall well-controlled.   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 -Reporting no significant bowel movements currently. -Continue to maintain adequate hydration.   HTN (hypertension) -No longer take carvedilol; blood pressure overall stable. -Heart healthy diet discussed with patient. -Continue to follow vital signs.   Personal history of PE (pulmonary embolism) -Patient was taking apixaban prior to admission -Continue to hold apixaban given acute GI bleed concerns.   -After discussing with GI will resume anticoagulation. -Patient has remained without overt bleeding at this point. -Hemoglobin is stable at 8.3; continue to follow trend.  Consultants: Oncology service, gastroenterology service, infectious disease, cardiology service and general surgery. Procedures performed: See below for x-ray reports; 2D echo. Disposition: Home with home health services. Diet recommendation: Heart healthy diet.  DISCHARGE MEDICATION: Allergies as of 04/13/2022   No Known Allergies      Medication List     STOP taking these medications    azithromycin 250 MG tablet Commonly known as: ZITHROMAX       TAKE these medications    apixaban 5 MG Tabs  tablet Commonly known as: ELIQUIS Take 1 tablet (5 mg total) by mouth 2 (two) times daily. Restart 01/11/22   atorvastatin 20 MG  tablet Commonly known as: LIPITOR Take 1 tablet (20 mg total) by mouth daily. Start taking on: April 23, 2022 What changed: These instructions start on April 23, 2022. If you are unsure what to do until then, ask your doctor or other care provider.   bacitracin ointment Apply topically daily.   chlorproMAZINE 25 MG tablet Commonly known as: THORAZINE Take 1 tablet (25 mg total) by mouth every 6 (six) hours as needed. What changed: reasons to take this   Creon 36000 UNITS Cpep capsule Generic drug: lipase/protease/amylase Take 72,000-108,000 Units by mouth See admin instructions. Take 3 capsules (108,000 units) by mouth with meals and take 2 capsules (72,000 units) with snacks.   cyanocobalamin 100 MCG tablet Take by mouth.   daptomycin  IVPB Commonly known as: CUBICIN Inject 450 mg into the vein daily for 13 days. Indication:  MRSE bacteremia / infected port First Dose: No Last Day of Therapy:  04/22/2022 Labs - Once weekly:  CBC/D, BMP, and CPK Labs - Every other week:  ESR and CRP Method of administration: IV Push Method of administration may be changed at the discretion of home infusion pharmacist based upon assessment of the patient and/or caregiver's ability to self-administer the medication ordered.   diclofenac Sodium 1 % Gel Commonly known as: VOLTAREN Apply 4 g topically 4 (four) times daily.   dicyclomine 10 MG capsule Commonly known as: BENTYL Take 10 mg by mouth 2 (two) times daily before a meal.   diphenoxylate-atropine 2.5-0.025 MG tablet Commonly known as: LOMOTIL Take 1 tablet by mouth 4 times daily   escitalopram 20 MG tablet Commonly known as: LEXAPRO Take 1 tablet (20 mg total) by mouth daily.   feeding supplement Liqd Take 237 mLs by mouth 2 (two) times daily between meals.   ferrous sulfate 325 (65 FE) MG EC  tablet Take by mouth.   gabapentin 100 MG capsule Commonly known as: NEURONTIN TAKE 1 CAPSULE BY MOUTH EVERY DAY AT BEDTIME What changed: See the new instructions.   HYDROcodone-acetaminophen 5-325 MG tablet Commonly known as: NORCO/VICODIN Take 1 tablet by mouth every 8 (eight) hours as needed for moderate pain.   hydrocortisone cream 1 % Apply 1 Application topically 2 (two) times daily.   INFUVITE ADULT IV 5 mLs See admin instructions. Inject 5 mls from each vial #1 and #2   magnesium oxide 400 (240 Mg) MG tablet Commonly known as: MAG-OX TAKE 1 TABLET BY MOUTH TWICE A DAY   megestrol 40 MG/ML suspension Commonly known as: MEGACE TAKE 10 MLS (400 MG TOTAL) BY MOUTH 2 (TWO) TIMES DAILY. What changed: medication strength   mirtazapine 30 MG tablet Commonly known as: REMERON TAKE 1 TABLET BY MOUTH AT BEDTIME   multivitamin with minerals Tabs tablet Take 1 tablet by mouth daily.   ondansetron 4 MG disintegrating tablet Commonly known as: ZOFRAN-ODT TAKE 1 TABLET BY MOUTH EVERY 4 HOURS AS NEEDED FOR NAUSEA AND VOMITING   pantoprazole 40 MG tablet Commonly known as: PROTONIX Take 1 tablet (40 mg total) by mouth 2 (two) times daily before a meal.   Prevalite 4 g packet Generic drug: cholestyramine light Take 1 packet (4 g total) by mouth daily. DO NOT TAKE WITHIN 2 HOURS OF OTHER MEDICATIONS   Probiotic 1-250 BILLION-MG Caps Take 1 capsule by mouth daily.   prochlorperazine 10 MG tablet Commonly known as: COMPAZINE Take 10 mg by mouth every 6 (six) hours as needed for vomiting or nausea.   sterile water SOLN with Travasol  10 % SOLN 50 g/L, dextrose 70 % SOLN 20 %, sodium INJ 35 mEq/L, potassium INJ 30 mEq/L, calcium GLUCONATE INJ 4.7 mEq/L, magnesium SULFATE INJ 5 mEq/L, phosphorus INJ 15 mmol/L, M.V.I. Adult INJ 10 mL, trace elements Cr-Cu-Mn-Se-Zn (MTE-5 Concentrate) 12-998-500-60 MCG/ML SOLN 1 mL, fat emulsion 30 % EMUL 100 mL/L continuous.   sucralfate 1  GM/10ML suspension Commonly known as: CARAFATE TAKE 10 MLS (1 G TOTAL) BY MOUTH 4 (FOUR) TIMES DAILY AS NEEDED. FOR BREAKTHROUGH HEARTBURN What changed:  reasons to take this additional instructions   traMADol 50 MG tablet Commonly known as: ULTRAM Take 1 tablet by mouth every 6 (six) hours as needed for moderate pain or severe pain.   Vitamin D 50 MCG (2000 UT) Caps Take 2,000 Units by mouth daily.   Vitamin D3 10 MCG (400 UNIT) tablet Take 400 Units by mouth daily.               Discharge Care Instructions  (From admission, onward)           Start     Ordered   04/13/22 0000  Change dressing on IV access line weekly and PRN  (Home infusion instructions - [facility] )        04/13/22 0854            Follow-up Information     Romeo Rabon, MD. Schedule an appointment as soon as possible for a visit in 10 day(s).   Specialty: Internal Medicine Contact information: [address] [place] [address] [phone]                Discharge Exam: Filed Weights   04/03/22 1615 04/07/22 1209  Weight: 54.4 kg 54.4 kg   General exam: Alert, awake, oriented x 3; febrile, no chest pain, no nausea or vomiting.  In no acute distress. Respiratory system: Clear to auscultation. Respiratory effort normal.  Good saturation on room air. Cardiovascular system: Rate controlled, no rubs, no gallops, no JVD. Gastrointestinal system: Abdomen is nondistended, soft and nontender. No organomegaly or masses felt. Normal bowel sounds heard. Central nervous system: Alert and oriented. No focal neurological deficits. Extremities: No cyanosis or clubbing. Skin: No petechiae. Psychiatry: Judgement and insight appear normal. Mood & affect appropriate.   Condition at discharge: Stable and improved.  The results of significant diagnostics from this hospitalization (including imaging, microbiology, ancillary and laboratory) are listed below for reference.    Imaging Studies: [place] EKG SITE RITE  Result Date: 04/10/2022 If Site Rite image not attached, placement could not be confirmed due to current cardiac rhythm.  ECHO TEE  Result Date: 04/09/2022    TRANSESOPHOGEAL ECHO REPORT   Patient Name:   [name] Date of Exam: 04/09/2022 Medical Rec #:  [id number]        Height:       68.0 in Accession #:    [id number]       Weight:       119.9 lb Date of Birth:  01-26-1951       BSA:          1.644 m Patient Age:    71 years         BP:           130/80 mmHg Patient Gender: M                HR:           78 bpm. [facility] Procedure: Transesophageal Echo, Color Doppler and Cardiac Doppler Indications:    Bacteremia  History:        Patient has prior history of Echocardiogram examinations, most                 recent 04/04/2022. Signs/Symptoms:Chest Pain; Risk                 Factors:Hypertension and Dyslipidemia. CKD, Colon CA.  Sonographer:    Mikki [name] Referring Phys: [id number] Dorothe Pea BRANCH PROCEDURE: The transesophogeal probe was passed without difficulty through the esophogus of the patient. Sedation performed by different physician. The patient developed no complications during the procedure.  IMPRESSIONS  1. Left ventricular ejection fraction, by estimation, is 60 to 65%. The left ventricle has normal function.  2. Right ventricular systolic function is normal. The right ventricular size is normal.  3. No left atrial/left atrial appendage thrombus was detected. The LAA emptying velocity was 97 cm/s.  4. The mitral valve is normal in structure. No evidence of mitral valve regurgitation. No evidence of mitral stenosis.  5. The aortic valve is tricuspid. Aortic valve regurgitation is not visualized. No aortic stenosis is present. Conclusion(s)/Recommendation(s): No evidence of vegetation/infective endocarditis on this transesophageael echocardiogram. FINDINGS  Left Ventricle: Left ventricular ejection fraction, by estimation, is 60 to  65%. The left ventricle has normal function. The left ventricular internal cavity size was normal in size. Right Ventricle: The right ventricular size is normal. Right vetricular wall thickness was not well visualized. Right ventricular systolic function is normal. Left Atrium: Left atrial size was normal in size. No left atrial/left atrial appendage thrombus was detected. The LAA emptying velocity was 97 cm/s. Right Atrium: Right atrial size was normal in size. Pericardium: There is no evidence of pericardial effusion. Mitral Valve: The mitral valve is normal in structure. No evidence of mitral valve regurgitation. No evidence of mitral valve stenosis. Tricuspid Valve: The tricuspid valve is normal in structure. Tricuspid valve regurgitation is mild . No evidence of tricuspid stenosis. Aortic Valve: The aortic valve is tricuspid. Aortic valve regurgitation is not visualized. No aortic stenosis is present. Pulmonic Valve: The pulmonic valve was not well visualized. Pulmonic valve regurgitation is not visualized. No evidence of pulmonic stenosis. Aorta: The aortic root is normal in size and structure. IAS/Shunts: No atrial level shunt detected by color flow Doppler.   AORTA Ao Root diam: 3.50 cm Dina Rich MD Electronically signed by Dina Rich MD Signature Date/Time: 04/09/2022/2:49:02 PM    Final    DG Chest Port 1 View  Result Date: 04/08/2022 CLINICAL DATA:  Port removal EXAM: PORTABLE CHEST 1 VIEW COMPARISON:  None Available. FINDINGS: Stable scarring or consolidation in the left hemithorax with decreased volumes. Right lung clear. No pneumothorax. No definite pleural effusion. Aorta is ectatic. Postop changes epigastric region and right upper quadrant. Left-sided Port-A-Cath has IMPRESSION: Left hemithorax scarring or consolidation and hypoventilation. Otherwise no acute cardiopulmonary process. Electronically Signed   By: Layla Maw M.D.   On: 04/08/2022 10:52   ECHOCARDIOGRAM  COMPLETE  Result Date: 04/04/2022    ECHOCARDIOGRAM REPORT   Patient Name:   [name] Date of Exam: 04/04/2022 Medical Rec #:  [id number]        Height:       68.0 in Accession #:    [id number]       Weight:       120.0 lb Date of Birth:  23-May-1950  BSA:          1.645 m Patient Age:    71 years         BP:           95/46 mmHg Patient Gender: M                HR:           91 bpm. Exam Location:  Inpatient Procedure: 2D Echo, Cardiac Doppler and Color Doppler Indications:    Bacteremia  History:        Patient has prior history of Echocardiogram examinations, most                 recent 03/20/2016. Signs/Symptoms:Fever; Risk                 Factors:Hypertension. Cancer. Pulmonary embolus 2012.  Sonographer:    Sheralyn Boatman RDCS Referring Phys: [phone] DAVID TAT IMPRESSIONS  1. Left ventricular ejection fraction, by estimation, is 60 to 65%. Left ventricular ejection fraction by 2D MOD biplane is 60.6 %. The left ventricle has normal function. The left ventricle has no regional wall motion abnormalities. Left ventricular diastolic parameters are consistent with Grade I diastolic dysfunction (impaired relaxation).  2. Right ventricular systolic function is normal. The right ventricular size is normal. There is mildly elevated pulmonary artery systolic pressure. The estimated right ventricular systolic pressure is 43.2 mmHg.  3. The mitral valve is grossly normal. Trivial mitral valve regurgitation.  4. The aortic valve is tricuspid. Aortic valve regurgitation is not visualized.  5. The inferior vena cava is normal in size with greater than 50% respiratory variability, suggesting right atrial pressure of 3 mmHg. Conclusion(s)/Recommendation(s): No evidence of valvular vegetations on this transthoracic echocardiogram. Consider a transesophageal echocardiogram to exclude infective endocarditis if clinically indicated. FINDINGS  Left Ventricle: Left ventricular ejection fraction, by estimation, is 60 to 65%.  Left ventricular ejection fraction by 2D MOD biplane is 60.6 %. The left ventricle has normal function. The left ventricle has no regional wall motion abnormalities. The left ventricular internal cavity size was normal in size. There is no left ventricular hypertrophy. Left ventricular diastolic parameters are consistent with Grade I diastolic dysfunction (impaired relaxation). Indeterminate filling pressures. Right Ventricle: The right ventricular size is normal. No increase in right ventricular wall thickness. Right ventricular systolic function is normal. There is mildly elevated pulmonary artery systolic pressure. The tricuspid regurgitant velocity is 3.17  m/s, and with an assumed right atrial pressure of 3 mmHg, the estimated right ventricular systolic pressure is 43.2 mmHg. Left Atrium: Left atrial size was normal in size. Right Atrium: Right atrial size was normal in size. Pericardium: There is no evidence of pericardial effusion. Mitral Valve: The mitral valve is grossly normal. Trivial mitral valve regurgitation. Tricuspid Valve: The tricuspid valve is grossly normal. Tricuspid valve regurgitation is mild. Aortic Valve: The aortic valve is tricuspid. Aortic valve regurgitation is not visualized. Pulmonic Valve: The pulmonic valve was normal in structure. Pulmonic valve regurgitation is not visualized. Aorta: The aortic root and ascending aorta are structurally normal, with no evidence of dilitation. Venous: The inferior vena cava is normal in size with greater than 50% respiratory variability, suggesting right atrial pressure of 3 mmHg. IAS/Shunts: No atrial level shunt detected by color flow Doppler.  LEFT VENTRICLE PLAX 2D                        Biplane EF (MOD) LVIDd:  3.80 cm         LV Biplane EF:   Left LVIDs:         2.40 cm                          ventricular LV PW:         1.10 cm                          ejection LV IVS:        0.80 cm                          fraction by LVOT diam:      2.30 cm                          2D MOD LV SV:         90                               biplane is LV SV Index:   55                               60.6 %. LVOT Area:     4.15 cm                                Diastology                                LV e' medial:    8.70 cm/s LV Volumes (MOD)               LV E/e' medial:  13.4 LV vol d, MOD    96.3 ml       LV e' lateral:   11.90 cm/s A2C:                           LV E/e' lateral: 9.8 LV vol d, MOD    90.4 ml A4C: LV vol s, MOD    41.7 ml A2C: LV vol s, MOD    38.0 ml A4C: LV SV MOD A2C:   54.6 ml LV SV MOD A4C:   90.4 ml LV SV MOD BP:    62.1 ml RIGHT VENTRICLE             IVC RV S prime:     16.00 cm/s  IVC diam: 1.80 cm TAPSE (M-mode): 1.8 cm LEFT ATRIUM             Index        RIGHT ATRIUM           Index LA diam:        3.60 cm 2.19 cm/m   RA Area:     13.50 cm LA Vol (A2C):   69.4 ml 42.19 ml/m  RA Volume:   36.00 ml  21.89 ml/m LA Vol (A4C):   27.6 ml 16.78 ml/m LA Biplane Vol: 46.0 ml 27.97 ml/m  AORTIC VALVE LVOT Vmax:   135.00 cm/s LVOT Vmean:  90.700 cm/s LVOT VTI:  0.216 m  AORTA Ao Root diam: 3.10 cm Ao Asc diam:  3.60 cm MITRAL VALVE                TRICUSPID VALVE MV Area (PHT): 4.15 cm     TR Peak grad:   40.2 mmHg MV Decel Time: 183 msec     TR Vmax:        317.00 cm/s MV E velocity: 116.67 cm/s MV A velocity: 96.57 cm/s   SHUNTS MV E/A ratio:  1.21         Systemic VTI:  0.22 m                             Systemic Diam: 2.30 cm Zoila Shutter MD Electronically signed by Zoila Shutter MD Signature Date/Time: 04/04/2022/5:27:58 PM    Final    DG Chest Port 1 View  Result Date: 04/02/2022 CLINICAL DATA:  Possible sepsis EXAM: PORTABLE CHEST 1 VIEW COMPARISON:  01/08/2022 FINDINGS: Cardiac size is within normal limits. There is decreased volume in the left lung. Linear densities are seen in left mid and left lower lung fields. Surgical staples are seen in left parahilar region. Left hemidiaphragm is elevated. There is blunting of left  lateral CP angle. There is no pneumothorax. Tip of left subclavian chest port is seen in superior vena cava. IMPRESSION: Postsurgical changes are noted in left hemithorax. There are no new infiltrates or signs of pulmonary edema. Electronically Signed   By: Ernie Avena M.D.   On: 04/02/2022 13:21    Microbiology: Results for orders placed or performed during the hospital encounter of 04/03/22  Blood culture (routine x 2)     Status: Abnormal   Collection Time: 04/03/22  4:23 PM   Specimen: BLOOD  Result Value Ref Range Status   Specimen Description BLOOD LEFT ANTECUBITAL  Final   Special Requests   Final    BOTTLES DRAWN AEROBIC AND ANAEROBIC Blood Culture adequate volume   Culture  Setup Time   Final    GRAM POSITIVE COCCI Gram Stain Report Called to,Read Back By and Verified With: T. LEONARD  @ 0944 BY STEPHTR  04/04/22 IN BOTH AEROBIC AND ANAEROBIC BOTTLES CRITICAL VALUE NOTED.  VALUE IS CONSISTENT WITH PREVIOUSLY REPORTED AND CALLED VALUE. Performed at [facility], [address], [place], [place] [address]    Culture (A)  Final    STAPHYLOCOCCUS EPIDERMIDIS STAPHYLOCOCCUS HOMINIS STAPHYLOCOCCUS HOMINIS SUSCEPTIBILITIES PERFORMED ON PREVIOUS CULTURE WITHIN THE LAST 5 DAYS.    Report Status 04/07/2022 FINAL  Final   Organism ID, Bacteria STAPHYLOCOCCUS EPIDERMIDIS  Final      Susceptibility   Staphylococcus epidermidis - MIC*    CIPROFLOXACIN <=0.5 SENSITIVE Sensitive     ERYTHROMYCIN >=8 RESISTANT Resistant     GENTAMICIN <=0.5 SENSITIVE Sensitive     OXACILLIN >=4 RESISTANT Resistant     TETRACYCLINE 2 SENSITIVE Sensitive     VANCOMYCIN 2 SENSITIVE Sensitive     TRIMETH/SULFA <=10 SENSITIVE Sensitive     CLINDAMYCIN <=0.25 SENSITIVE Sensitive     RIFAMPIN <=0.5 SENSITIVE Sensitive     Inducible Clindamycin NEGATIVE Sensitive     * STAPHYLOCOCCUS EPIDERMIDIS  Blood culture (routine x 2)     Status: Abnormal   Collection Time: 04/03/22  4:28 PM   Specimen:  BLOOD RIGHT HAND  Result Value Ref Range Status   Specimen Description BLOOD RIGHT HAND  Final   Special Requests  Final    BOTTLES DRAWN AEROBIC AND ANAEROBIC Blood Culture adequate volume   Culture  Setup Time   Final    GRAM POSITIVE COCCI Gram Stain Report Called to,Read Back By and Verified With:  T. LEONARD @ 0944 BY STEPHTR 04/04/22 IN BOTH AEROBIC AND ANAEROBIC BOTTLES Organism ID to follow CRITICAL RESULT CALLED TO, READ BACK BY AND VERIFIED WITH:  Perlie Gold, RN 04/04/22 1552 A. LAFRANCE Performed at [facility], [address], [place], [place] [address]    Culture (A)  Final    STAPHYLOCOCCUS EPIDERMIDIS STAPHYLOCOCCUS HOMINIS STAPHYLOCOCCUS HOMINIS SUSCEPTIBILITIES PERFORMED ON PREVIOUS CULTURE WITHIN THE LAST 5 DAYS.    Report Status 04/07/2022 FINAL  Final   Organism ID, Bacteria STAPHYLOCOCCUS EPIDERMIDIS  Final      Susceptibility   Staphylococcus epidermidis - MIC*    CIPROFLOXACIN <=0.5 SENSITIVE Sensitive     ERYTHROMYCIN >=8 RESISTANT Resistant     GENTAMICIN <=0.5 SENSITIVE Sensitive     OXACILLIN >=4 RESISTANT Resistant     TETRACYCLINE 2 SENSITIVE Sensitive     VANCOMYCIN 2 SENSITIVE Sensitive     TRIMETH/SULFA <=10 SENSITIVE Sensitive     CLINDAMYCIN <=0.25 SENSITIVE Sensitive     RIFAMPIN <=0.5 SENSITIVE Sensitive     Inducible Clindamycin NEGATIVE Sensitive     * STAPHYLOCOCCUS EPIDERMIDIS  Blood Culture ID Panel (Reflexed)     Status: Abnormal   Collection Time: 04/03/22  4:28 PM  Result Value Ref Range Status   Enterococcus faecalis NOT DETECTED NOT DETECTED Final   Enterococcus Faecium NOT DETECTED NOT DETECTED Final   Listeria monocytogenes NOT DETECTED NOT DETECTED Final   Staphylococcus species DETECTED (A) NOT DETECTED Final    Comment: CRITICAL RESULT CALLED TO, READ BACK BY AND VERIFIED WITH:  Perlie Gold, RN 04/04/22 1552 A. LAFRANCE    Staphylococcus aureus (BCID) NOT DETECTED NOT DETECTED Final   Staphylococcus  epidermidis DETECTED (A) NOT DETECTED Final    Comment: Methicillin (oxacillin) resistant coagulase negative staphylococcus. Possible blood culture contaminant (unless isolated from more than one blood culture draw or clinical case suggests pathogenicity). No antibiotic treatment is indicated for blood  culture contaminants. CRITICAL RESULT CALLED TO, READ BACK BY AND VERIFIED WITH:  Perlie Gold, RN 04/04/22 1552 A. LAFRANCE    Staphylococcus lugdunensis NOT DETECTED NOT DETECTED Final   Streptococcus species NOT DETECTED NOT DETECTED Final   Streptococcus agalactiae NOT DETECTED NOT DETECTED Final   Streptococcus pneumoniae NOT DETECTED NOT DETECTED Final   Streptococcus pyogenes NOT DETECTED NOT DETECTED Final   A.calcoaceticus-baumannii NOT DETECTED NOT DETECTED Final   Bacteroides fragilis NOT DETECTED NOT DETECTED Final   Enterobacterales NOT DETECTED NOT DETECTED Final   Enterobacter cloacae complex NOT DETECTED NOT DETECTED Final   Escherichia coli NOT DETECTED NOT DETECTED Final   Klebsiella aerogenes NOT DETECTED NOT DETECTED Final   Klebsiella oxytoca NOT DETECTED NOT DETECTED Final   Klebsiella pneumoniae NOT DETECTED NOT DETECTED Final   Proteus species NOT DETECTED NOT DETECTED Final   Salmonella species NOT DETECTED NOT DETECTED Final   Serratia marcescens NOT DETECTED NOT DETECTED Final   Haemophilus influenzae NOT DETECTED NOT DETECTED Final   Neisseria meningitidis NOT DETECTED NOT DETECTED Final   Pseudomonas aeruginosa NOT DETECTED NOT DETECTED Final   Stenotrophomonas maltophilia NOT DETECTED NOT DETECTED Final   Candida albicans NOT DETECTED NOT DETECTED Final   Candida auris NOT DETECTED NOT DETECTED Final   Candida glabrata NOT DETECTED NOT DETECTED Final  Candida krusei NOT DETECTED NOT DETECTED Final   Candida parapsilosis NOT DETECTED NOT DETECTED Final   Candida tropicalis NOT DETECTED NOT DETECTED Final   Cryptococcus neoformans/gattii NOT DETECTED NOT  DETECTED Final   Methicillin resistance mecA/C DETECTED (A) NOT DETECTED Final    Comment: CRITICAL RESULT CALLED TO, READ BACK BY AND VERIFIED WITH:  Perlie Gold, RN 04/04/22 1552 A. LAFRANCE Performed at [facility], [address], [place], [place] [address]   Culture, blood (Routine X 2) w Reflex to ID Panel     Status: Abnormal   Collection Time: 04/04/22 10:20 AM   Specimen: BLOOD RIGHT ARM  Result Value Ref Range Status   Specimen Description   Final    BLOOD RIGHT ARM BOTTLES DRAWN AEROBIC AND ANAEROBIC   Special Requests   Final    Blood Culture results may not be optimal due to an excessive volume of blood received in culture bottles   Culture  Setup Time   Final    GRAM POSITIVE COCCI ANAEROBIC BOTTLE ONLY Gram Stain Report Called to,Read Back By and Verified With: BASS V. AT 1513 ON [id number] BY THOMPSON S. CRITICAL VALUE NOTED.  VALUE IS CONSISTENT WITH PREVIOUSLY REPORTED AND CALLED VALUE.    Culture (A)  Final    STAPHYLOCOCCUS EPIDERMIDIS SUSCEPTIBILITIES PERFORMED ON PREVIOUS CULTURE WITHIN THE LAST 5 DAYS.    Report Status 04/09/2022 FINAL  Final  Culture, blood (Routine X 2) w Reflex to ID Panel     Status: None   Collection Time: 04/04/22 10:20 AM   Specimen: BLOOD RIGHT HAND  Result Value Ref Range Status   Specimen Description   Final    BLOOD RIGHT HAND BOTTLES DRAWN AEROBIC AND ANAEROBIC   Special Requests   Final    Blood Culture results may not be optimal due to an excessive volume of blood received in culture bottles   Culture   Final    NO GROWTH 5 DAYS Performed at [facility], [address], [place], [place] [address]    Report Status 04/09/2022 FINAL  Final  Gastrointestinal Panel by PCR , Stool     Status: None   Collection Time: 04/04/22  2:24 PM  Result Value Ref Range Status   Campylobacter species NOT DETECTED NOT DETECTED Final   Plesimonas shigelloides NOT DETECTED NOT DETECTED Final   Salmonella species NOT DETECTED NOT DETECTED  Final   Yersinia enterocolitica NOT DETECTED NOT DETECTED Final   Vibrio species NOT DETECTED NOT DETECTED Final   Vibrio cholerae NOT DETECTED NOT DETECTED Final   Enteroaggregative E coli (EAEC) NOT DETECTED NOT DETECTED Final   Enteropathogenic E coli (EPEC) NOT DETECTED NOT DETECTED Final   Enterotoxigenic E coli (ETEC) NOT DETECTED NOT DETECTED Final   Shiga like toxin producing E coli (STEC) NOT DETECTED NOT DETECTED Final   Shigella/Enteroinvasive E coli (EIEC) NOT DETECTED NOT DETECTED Final   Cryptosporidium NOT DETECTED NOT DETECTED Final   Cyclospora cayetanensis NOT DETECTED NOT DETECTED Final   Entamoeba histolytica NOT DETECTED NOT DETECTED Final   Giardia lamblia NOT DETECTED NOT DETECTED Final   Adenovirus F40/41 NOT DETECTED NOT DETECTED Final   Astrovirus NOT DETECTED NOT DETECTED Final   Norovirus GI/GII NOT DETECTED NOT DETECTED Final   Rotavirus A NOT DETECTED NOT DETECTED Final   Sapovirus (I, II, IV, and V) NOT DETECTED NOT DETECTED Final    Comment: Performed at [facility], [address] [place], [place] [address]  Culture, blood (single) w Reflex to ID Panel     Status: Abnormal   Collection Time: 04/05/22  4:30 PM   Specimen: Right Antecubital; Blood  Result Value Ref Range Status   Specimen Description   Final    RIGHT ANTECUBITAL Performed at [facility], [address], [place], [place] [address]    Special Requests   Final    BOTTLES DRAWN AEROBIC AND ANAEROBIC Blood Culture adequate volume Performed at [facility], [address], [place], [place] [address]    Culture  Setup Time   Final    GRAM POSITIVE COCCI Gram Stain Report Called to,Read Back By and Verified With: MUSE,[email or web address]  BY MATTHEWS, B 1.16.2024 IN BOTH AEROBIC AND ANAEROBIC BOTTLES CRITICAL VALUE NOTED.  VALUE IS CONSISTENT WITH PREVIOUSLY REPORTED AND CALLED VALUE.    Culture (A)  Final    STAPHYLOCOCCUS EPIDERMIDIS SUSCEPTIBILITIES PERFORMED ON PREVIOUS CULTURE WITHIN THE  LAST 5 DAYS. Performed at [facility], [place] [address], [place], [place] [address]    Report Status 04/08/2022 FINAL  Final  Culture, blood (Routine X 2) w Reflex to ID Panel     Status: None (Preliminary result)   Collection Time: 04/08/22  9:28 AM   Specimen: BLOOD  Result Value Ref Range Status   Specimen Description BLOOD RIGHT ANTECUBITAL  Final   Special Requests   Final    BOTTLES DRAWN AEROBIC AND ANAEROBIC Blood Culture adequate volume   Culture   Final    NO GROWTH 4 DAYS Performed at [facility], [address] [place], [place] [address]    Report Status PENDING  Incomplete  Culture, blood (Routine X 2) w Reflex to ID Panel     Status: None (Preliminary result)   Collection Time: 04/08/22  9:33 AM   Specimen: BLOOD  Result Value Ref Range Status   Specimen Description BLOOD BLOOD LEFT HAND  Final   Special Requests   Final    BOTTLES DRAWN AEROBIC AND ANAEROBIC Blood Culture adequate volume   Culture   Final    NO GROWTH 4 DAYS Performed at [facility], [address], [place], [place] [address]    Report Status PENDING  Incomplete  Cath Tip Culture     Status: Abnormal   Collection Time: 04/08/22 10:10 AM   Specimen: Catheter Tip; Other  Result Value Ref Range Status   Specimen Description   Final    PORTA CATH Performed at [facility], [place] [address], [place], [place] [address]    Special Requests   Final    NONE Performed at [facility], [address], [place],  [address]    Culture (A)  Final    >=100,000 COLONIES/mL STAPHYLOCOCCUS EPIDERMIDIS 20,000 COLONIES/mL STAPHYLOCOCCUS HOMINIS    Report Status 04/13/2022 FINAL  Final   Organism ID, Bacteria STAPHYLOCOCCUS EPIDERMIDIS (A)  Final   Organism ID, Bacteria STAPHYLOCOCCUS HOMINIS (A)  Final      Susceptibility   Staphylococcus epidermidis - MIC*    CIPROFLOXACIN <=0.5 SENSITIVE Sensitive     ERYTHROMYCIN >=8 RESISTANT Resistant     GENTAMICIN <=0.5 SENSITIVE Sensitive      OXACILLIN >=4 RESISTANT Resistant     TETRACYCLINE 2 SENSITIVE Sensitive     VANCOMYCIN 2 SENSITIVE Sensitive     TRIMETH/SULFA <=10 SENSITIVE Sensitive     CLINDAMYCIN <=0.25 SENSITIVE Sensitive     RIFAMPIN <=0.5 SENSITIVE Sensitive     Inducible Clindamycin NEGATIVE Sensitive     * >=100,000  COLONIES/mL STAPHYLOCOCCUS EPIDERMIDIS   Staphylococcus hominis - MIC*    CIPROFLOXACIN <=0.5 SENSITIVE Sensitive     ERYTHROMYCIN >=8 RESISTANT Resistant     GENTAMICIN <=0.5 SENSITIVE Sensitive     OXACILLIN >=4 RESISTANT Resistant     TETRACYCLINE >=16 RESISTANT Resistant     VANCOMYCIN <=0.5 SENSITIVE Sensitive     TRIMETH/SULFA <=10 SENSITIVE Sensitive     CLINDAMYCIN <=0.25 SENSITIVE Sensitive     RIFAMPIN <=0.5 SENSITIVE Sensitive     Inducible Clindamycin NEGATIVE Sensitive     * 20,000 COLONIES/mL STAPHYLOCOCCUS HOMINIS  Culture, blood (Routine X 2) w Reflex to ID Panel     Status: None (Preliminary result)   Collection Time: 04/09/22  3:52 PM   Specimen: Right Antecubital; Blood  Result Value Ref Range Status   Specimen Description   Final    RIGHT ANTECUBITAL BOTTLES DRAWN AEROBIC AND ANAEROBIC   Special Requests Blood Culture adequate volume  Final   Culture   Final    NO GROWTH 3 DAYS Performed at [facility], [address] [place], [place] [address]    Report Status PENDING  Incomplete  Culture, blood (Routine X 2) w Reflex to ID Panel     Status: None (Preliminary result)   Collection Time: 04/09/22  3:52 PM   Specimen: BLOOD RIGHT HAND  Result Value Ref Range Status   Specimen Description   Final    BLOOD RIGHT HAND BOTTLES DRAWN AEROBIC AND ANAEROBIC   Special Requests Blood Culture adequate volume  Final   Culture   Final    NO GROWTH 3 DAYS Performed at [facility], [address] [place], [place] [address]    Report Status PENDING  Incomplete    Labs: CBC: Recent Labs  Lab 04/07/22 0402 04/12/22 0746  WBC 7.8 6.5  HGB 8.2* 8.3*  HCT 24.9* 26.5*  MCV  92.2 97.8  PLT 124* 210   Basic Metabolic Panel: Recent Labs  Lab 04/07/22 0402 04/08/22 0357 04/10/22 0805 04/11/22 0454 04/12/22 0443  NA 136 136 138 138 137  K 4.3 4.6 3.8 4.3 4.4  CL 108 108 114* 114* 115*  CO2 26 23 17* 20* 20*  GLUCOSE 120* 116* 103* 92 118*  BUN 12 14 12 13 11   CREATININE 0.85 0.88 1.18 1.14 1.16  CALCIUM 7.6* 7.7* 7.3* 7.5* 7.6*  MG 2.2 1.7 1.7 1.6* 2.2  PHOS  --  4.4 2.7 3.5 3.6   Liver Function Tests: Recent Labs  Lab 04/08/22 0357 04/10/22 0805 04/11/22 0454 04/12/22 0443  AST 55* 26 22 20   ALT 49* 35 32 30  ALKPHOS 87 79 74 80  BILITOT 0.3 0.1* 0.3 0.3  PROT 4.8* 5.0* 5.0* 5.2*  ALBUMIN 1.5* 1.6* 1.6* 1.8*   CBG: Recent Labs  Lab 04/12/22 1239 04/12/22 1729 04/13/22 0036 04/13/22 0527 04/13/22 0739  GLUCAP 101* 142* 85 93 94    Discharge time spent: greater than 30 minutes.  Signed: 04/15/22, MD [facility] 04/13/2022

## 2022-04-14 ENCOUNTER — Encounter: Payer: Self-pay | Admitting: *Deleted

## 2022-04-14 ENCOUNTER — Other Ambulatory Visit: Payer: Self-pay

## 2022-04-14 LAB — CULTURE, BLOOD (ROUTINE X 2)
Culture: NO GROWTH
Culture: NO GROWTH
Culture: NO GROWTH
Culture: NO GROWTH
Special Requests: ADEQUATE
Special Requests: ADEQUATE
Special Requests: ADEQUATE
Special Requests: ADEQUATE

## 2022-04-14 NOTE — Progress Notes (Signed)
[name] was contacted by telephone to verify understanding of discharge instructions status post their most recent discharge from the hospital on the date:  04/13/22.  Inpatient discharge AVS was re-reviewed with patient, along with cancer center appointments.  Verification of understanding for oncology specific follow-up was validated using the Teach Back method.    Transportation to appointments were confirmed for the patient as being self/caregiver.  [name]'s questions were addressed to their satisfaction upon completion of this post discharge follow-up call for outpatient oncology.

## 2022-04-16 ENCOUNTER — Encounter (HOSPITAL_COMMUNITY): Payer: Self-pay | Admitting: Cardiology

## 2022-04-16 ENCOUNTER — Emergency Department (HOSPITAL_COMMUNITY)
Admission: EM | Admit: 2022-04-16 | Discharge: 2022-04-16 | Disposition: A | Discharge status: Home / Self Care | Payer: Medicare Other | Attending: Emergency Medicine | Admitting: Emergency Medicine

## 2022-04-16 ENCOUNTER — Other Ambulatory Visit: Payer: Self-pay

## 2022-04-16 DIAGNOSIS — Y732 Prosthetic and other implants, materials and accessory gastroenterology and urology devices associated with adverse incidents: Secondary | ICD-10-CM | POA: Diagnosis not present

## 2022-04-16 DIAGNOSIS — T82838A Hemorrhage of vascular prosthetic devices, implants and grafts, initial encounter: Secondary | ICD-10-CM | POA: Insufficient documentation

## 2022-04-16 DIAGNOSIS — Z7901 Long term (current) use of anticoagulants: Secondary | ICD-10-CM | POA: Insufficient documentation

## 2022-04-16 DIAGNOSIS — C252 Malignant neoplasm of tail of pancreas: Secondary | ICD-10-CM

## 2022-04-16 NOTE — ED Notes (Signed)
PICC line dressing changed, biopatch changed. Both lines with blood return and flushed at family request.

## 2022-04-16 NOTE — ED Provider Notes (Signed)
[name] Provider Note   CSN: [id number] Arrival date & time: 04/16/22  2105     History  Chief Complaint  Patient presents with   Vascular Access Problem         [name] is a 72 y.o. male.  HPI 72 year old male presents with concern for bleeding around his PICC line.  His wife was going to give him TPN tonight and when they looked at the site there was blood.  It seemed bright red at first though now it is darker.  Has not seem to continue to bleed.  There is no pain or arm swelling.  They have not tried to use the PICC line since this happened but instead presented here.  The patient is on Eliquis for prior PE x 2.  Home Medications Prior to Admission medications   Medication Sig Start Date End Date Taking? Authorizing Provider  apixaban (ELIQUIS) 5 MG TABS tablet Take 1 tablet (5 mg total) by mouth 2 (two) times daily. Restart 01/11/22 01/10/22   Orson Eva, MD  atorvastatin (LIPITOR) 20 MG tablet Take 1 tablet (20 mg total) by mouth daily. 04/23/22   Barton Dubois, MD  Bacillus Coagulans-Inulin (PROBIOTIC) 1-250 BILLION-MG CAPS Take 1 capsule by mouth daily.    [provider]  bacitracin ointment Apply topically daily. 04/13/22   Barton Dubois, MD  chlorproMAZINE (THORAZINE) 25 MG tablet Take 1 tablet (25 mg total) by mouth every 6 (six) hours as needed. Patient taking differently: Take 25 mg by mouth every 6 (six) hours as needed for nausea or vomiting. 01/01/22   Derek Jack, MD  Cholecalciferol (VITAMIN D) 2000 units CAPS Take 2,000 Units by mouth daily.    [provider]  Cholecalciferol (VITAMIN D3) 10 MCG (400 UNIT) tablet Take 400 Units by mouth daily.    [provider]  CREON 36000-114000 units CPEP capsule Take 72,000-108,000 Units by mouth See admin instructions. Take 3 capsules (108,000 units) by mouth with meals and take 2 capsules (72,000 units) with snacks. 11/18/21    [provider]  cyanocobalamin 100 MCG tablet Take by mouth.    [provider]  daptomycin (CUBICIN) IVPB Inject 450 mg into the vein daily for 13 days. Indication:  MRSE bacteremia / infected port First Dose: No Last Day of Therapy:  04/22/2022 Labs - Once weekly:  CBC/D, BMP, and CPK Labs - Every other week:  ESR and CRP Method of administration: IV Push Method of administration may be changed at the discretion of home infusion pharmacist based upon assessment of the patient and/or caregiver's ability to self-administer the medication ordered. 04/13/22 04/26/22  Barton Dubois, MD  diclofenac Sodium (VOLTAREN) 1 % GEL Apply 4 g topically 4 (four) times daily. 01/28/22   [provider]  dicyclomine (BENTYL) 10 MG capsule Take 10 mg by mouth 2 (two) times daily before a meal. 01/18/22 01/18/23  [provider]  diphenoxylate-atropine (LOMOTIL) 2.5-0.025 MG tablet Take 1 tablet by mouth 4 times daily 02/04/22   Mahala Menghini, PA-C  escitalopram (LEXAPRO) 20 MG tablet Take 1 tablet (20 mg total) by mouth daily. 03/08/22   Derek Jack, MD  feeding supplement (ENSURE ENLIVE / ENSURE PLUS) LIQD Take 237 mLs by mouth 2 (two) times daily between meals. 04/13/22   Barton Dubois, MD  ferrous sulfate 325 (65 FE) MG EC tablet Take by mouth.    [provider]  gabapentin (NEURONTIN) 100 MG capsule TAKE  1 CAPSULE BY MOUTH EVERY DAY AT BEDTIME Patient taking differently: Take 100 mg by mouth at bedtime. 10/22/21   Derek Jack, MD  HYDROcodone-acetaminophen (NORCO/VICODIN) 5-325 MG tablet Take 1 tablet by mouth every 8 (eight) hours as needed for moderate pain. 03/17/22   Derek Jack, MD  hydrocortisone cream 1 % Apply 1 Application topically 2 (two) times daily.    [provider]  magnesium oxide (MAG-OX) 400 (240 Mg) MG tablet TAKE 1 TABLET BY MOUTH TWICE A DAY 01/22/22   Derek Jack, MD  megestrol (MEGACE) 40 MG/ML  suspension TAKE 10 MLS (400 MG TOTAL) BY MOUTH 2 (TWO) TIMES DAILY. 04/12/22   Derek Jack, MD  mirtazapine (REMERON) 30 MG tablet TAKE 1 TABLET BY MOUTH AT BEDTIME 02/16/22   Derek Jack, MD  Multiple Vitamin (INFUVITE ADULT IV) 5 mLs See admin instructions. Inject 5 mls from each vial #1 and #2    [provider]  Multiple Vitamin (MULTIVITAMIN WITH MINERALS) TABS tablet Take 1 tablet by mouth daily.    [provider]  ondansetron (ZOFRAN-ODT) 4 MG disintegrating tablet TAKE 1 TABLET BY MOUTH EVERY 4 HOURS AS NEEDED FOR NAUSEA AND VOMITING 04/02/22   Noemi Chapel, MD  pantoprazole (PROTONIX) 40 MG tablet Take 1 tablet (40 mg total) by mouth 2 (two) times daily before a meal. 06/15/21   Mahala Menghini, PA-C  PREVALITE 4 g packet Take 1 packet (4 g total) by mouth daily. DO NOT TAKE WITHIN 2 HOURS OF OTHER MEDICATIONS 08/04/21   Mahala Menghini, PA-C  prochlorperazine (COMPAZINE) 10 MG tablet Take 10 mg by mouth every 6 (six) hours as needed for vomiting or nausea.    [provider]  sterile water SOLN with Travasol 10 % SOLN 50 g/L, dextrose 70 % SOLN 20 %, sodium INJ 35 mEq/L, potassium INJ 30 mEq/L, calcium GLUCONATE INJ 4.7 mEq/L, magnesium SULFATE INJ 5 mEq/L, phosphorus INJ 15 mmol/L, M.V.I. Adult INJ 10 mL, trace elements Cr-Cu-Mn-Se-Zn (MTE-5 Concentrate) 12-998-500-60 MCG/ML SOLN 1 mL, fat emulsion 30 % EMUL 100 mL/L continuous.    [provider]  sucralfate (CARAFATE) 1 GM/10ML suspension TAKE 10 MLS (1 G TOTAL) BY MOUTH 4 (FOUR) TIMES DAILY AS NEEDED. [place] HEARTBURN Patient taking differently: Take 1 g by mouth 4 (four) times daily as needed (breakthrough heartburn). 10/22/20   Mahala Menghini, PA-C  traMADol (ULTRAM) 50 MG tablet Take 1 tablet by mouth every 6 (six) hours as needed for moderate pain or severe pain. 09/07/21   [provider]      Allergies    Patient has no known allergies.    Review of Systems    Review of Systems  Musculoskeletal:  Negative for arthralgias and myalgias.    Physical Exam Updated Vital Signs BP 116/79 (BP Location: Left Arm)   Pulse 90   Temp 98.5 F (36.9 C) (Oral)   Resp (!) 22   Ht 5\' 8"  (1.727 m)   Wt 54 kg   SpO2 97%   BMI 18.09 kg/m  Physical Exam Vitals and nursing note reviewed.  Constitutional:      Appearance: He is well-developed.  HENT:     Head: Normocephalic and atraumatic.  Cardiovascular:     Rate and Rhythm: Normal rate and regular rhythm.     Pulses:          Radial pulses are 2+ on the right side.  Pulmonary:     Effort: Pulmonary effort is normal.  Musculoskeletal:     Comments: Right PICC line in place. There is dark dried blood at the insertion site. No evidence of active bleeding. No swelling or tenderness proximal or distal.  Skin:    General: Skin is warm and dry.  Neurological:     Mental Status: He is alert.     ED Results / Procedures / Treatments   Labs (all labs ordered are listed, but only abnormal results are displayed) Labs Reviewed - No data to display  EKG None  Radiology No results found.  Procedures Procedures    Medications Ordered in ED Medications - No data to display  ED Course/ Medical Decision Making/ A&P                             Medical Decision Making  Patient's PICC line thing was changed by the nurse.  No active bleeding.  It flushes and is usable.  No indication for DVT ultrasound at this time.  Does not appear infected. Will d/c home.        Final Clinical Impression(s) / ED Diagnoses Final diagnoses:  Bleeding from peripherally inserted central catheter (PICC), initial encounter [facility])    Rx / DC Orders ED Discharge Orders     None         Sherwood Gambler, MD 04/16/22 2253

## 2022-04-16 NOTE — ED Triage Notes (Signed)
Pt to ED c/o PICC LINE Problem, pt had PICC Line placed on Saturday for TPN and ABX. Noticed blood at site around 6pm . Bleeding currently controlled. Pt denies pain.

## 2022-04-19 ENCOUNTER — Inpatient Hospital Stay: Payer: Medicare Other

## 2022-04-19 ENCOUNTER — Inpatient Hospital Stay (HOSPITAL_BASED_OUTPATIENT_CLINIC_OR_DEPARTMENT_OTHER): Payer: Medicare Other | Admitting: Hematology

## 2022-04-19 ENCOUNTER — Encounter: Payer: Self-pay | Admitting: Hematology

## 2022-04-19 VITALS — BP 139/84 | HR 91 | Temp 97.8°F | Resp 20 | Ht 68.0 in | Wt 123.8 lb

## 2022-04-19 DIAGNOSIS — R197 Diarrhea, unspecified: Secondary | ICD-10-CM | POA: Diagnosis not present

## 2022-04-19 DIAGNOSIS — Z79899 Other long term (current) drug therapy: Secondary | ICD-10-CM | POA: Diagnosis not present

## 2022-04-19 DIAGNOSIS — C252 Malignant neoplasm of tail of pancreas: Secondary | ICD-10-CM | POA: Diagnosis not present

## 2022-04-19 DIAGNOSIS — D649 Anemia, unspecified: Secondary | ICD-10-CM | POA: Diagnosis not present

## 2022-04-19 DIAGNOSIS — R634 Abnormal weight loss: Secondary | ICD-10-CM | POA: Diagnosis not present

## 2022-04-19 DIAGNOSIS — Z85118 Personal history of other malignant neoplasm of bronchus and lung: Secondary | ICD-10-CM | POA: Diagnosis not present

## 2022-04-19 DIAGNOSIS — Z86711 Personal history of pulmonary embolism: Secondary | ICD-10-CM | POA: Diagnosis not present

## 2022-04-19 DIAGNOSIS — Z87891 Personal history of nicotine dependence: Secondary | ICD-10-CM | POA: Diagnosis not present

## 2022-04-19 DIAGNOSIS — Z808 Family history of malignant neoplasm of other organs or systems: Secondary | ICD-10-CM | POA: Diagnosis not present

## 2022-04-19 DIAGNOSIS — K8689 Other specified diseases of pancreas: Secondary | ICD-10-CM

## 2022-04-19 LAB — CBC WITH DIFFERENTIAL/PLATELET
Abs Immature Granulocytes: 0.06 10*3/uL (ref 0.00–0.07)
Basophils Absolute: 0 10*3/uL (ref 0.0–0.1)
Basophils Relative: 0 %
Eosinophils Absolute: 0.1 10*3/uL (ref 0.0–0.5)
Eosinophils Relative: 1 %
HCT: 31.1 % — ABNORMAL LOW (ref 39.0–52.0)
Hemoglobin: 9.8 g/dL — ABNORMAL LOW (ref 13.0–17.0)
Immature Granulocytes: 1 %
Lymphocytes Relative: 20 %
Lymphs Abs: 1.8 10*3/uL (ref 0.7–4.0)
MCH: 31.2 pg (ref 26.0–34.0)
MCHC: 31.5 g/dL (ref 30.0–36.0)
MCV: 99 fL (ref 80.0–100.0)
Monocytes Absolute: 0.7 10*3/uL (ref 0.1–1.0)
Monocytes Relative: 8 %
Neutro Abs: 6.4 10*3/uL (ref 1.7–7.7)
Neutrophils Relative %: 70 %
Platelets: 223 10*3/uL (ref 150–400)
RBC: 3.14 MIL/uL — ABNORMAL LOW (ref 4.22–5.81)
RDW: 16.7 % — ABNORMAL HIGH (ref 11.5–15.5)
WBC: 9.1 10*3/uL (ref 4.0–10.5)
nRBC: 0 % (ref 0.0–0.2)

## 2022-04-19 LAB — COMPREHENSIVE METABOLIC PANEL
ALT: 40 U/L (ref 0–44)
AST: 39 U/L (ref 15–41)
Albumin: 2.8 g/dL — ABNORMAL LOW (ref 3.5–5.0)
Alkaline Phosphatase: 100 U/L (ref 38–126)
Anion gap: 7 (ref 5–15)
BUN: 21 mg/dL (ref 8–23)
CO2: 18 mmol/L — ABNORMAL LOW (ref 22–32)
Calcium: 7.7 mg/dL — ABNORMAL LOW (ref 8.9–10.3)
Chloride: 108 mmol/L (ref 98–111)
Creatinine, Ser: 1.2 mg/dL (ref 0.61–1.24)
GFR, Estimated: >60 mL/min (ref >60)
Glucose, Bld: 120 mg/dL — ABNORMAL HIGH (ref 70–99)
Potassium: 5.1 mmol/L (ref 3.5–5.1)
Sodium: 133 mmol/L — ABNORMAL LOW (ref 135–145)
Total Bilirubin: 0.2 mg/dL — ABNORMAL LOW (ref 0.3–1.2)
Total Protein: 6.8 g/dL (ref 6.5–8.1)

## 2022-04-19 LAB — IRON AND TIBC
Iron: 111 ug/dL (ref 45–182)
Saturation Ratios: 31 % (ref 17.9–39.5)
TIBC: 354 ug/dL (ref 250–450)
UIBC: 243 ug/dL

## 2022-04-19 LAB — FERRITIN: Ferritin: 536 ng/mL — ABNORMAL HIGH (ref 24–336)

## 2022-04-19 NOTE — Patient Instructions (Addendum)
[place]  Discharge Instructions  You were seen and examined today by Dr. Delton Coombes.  Dr. Delton Coombes discussed your most recent lab work which revealed that albumin has increased to 2.8 and hemoglobin is 9.8 a little better was 8.3 on 04/12/2022.  Follow-up as scheduled after PET scan.    Thank you for choosing [facility] to provide your oncology and hematology care.   To afford each patient quality time with our provider, please arrive at least 15 minutes before your scheduled appointment time. You may need to reschedule your appointment if you arrive late (10 or more minutes). Arriving late affects you and other patients whose appointments are after yours.  Also, if you miss three or more appointments without notifying the office, you may be dismissed from the clinic at the provider's discretion.    Again, thank you for choosing [facility].  Our hope is that these requests will decrease the amount of time that you wait before being seen by our physicians.   If you have a lab appointment with the [facility] please come in thru the Main Entrance and check in at the main information desk.           _____________________________________________________________  Should you have questions after your visit to [facility], please contact our office at [phone] and follow the prompts.  Our office hours are 8:00 a.m. to 4:30 p.m. Monday - Thursday and 8:00 a.m. to 2:30 p.m. Friday.  Please note that voicemails left after 4:00 p.m. may not be returned until the following business day.  We are closed weekends and all major holidays.  You do have access to a nurse 24-7, just call the main number to the clinic [phone] and do not press any options, hold on the line and a nurse will answer the phone.    For prescription refill requests, have your pharmacy contact our office and allow 72 hours.    Masks are  optional in the cancer centers. If you would like for your care team to wear a mask while they are taking care of you, please let them know. You may have one support person who is at least 72 years old accompany you for your appointments.

## 2022-04-19 NOTE — Progress Notes (Signed)
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   CURRENT THERAPY: FOLFIRINOX every 2 weeks  BRIEF ONCOLOGIC HISTORY:  Oncology History  Pancreatic cancer ([place])  10/07/2021 Initial Diagnosis   Pancreatic cancer ([place])   10/13/2021 - 11/19/2021 Chemotherapy   Patient is on Treatment Plan : PANCREAS Modified FOLFIRINOX q14d x 4 cycles     10/13/2021 -  Chemotherapy   Patient is on Treatment Plan : PANCREAS Modified FOLFIRINOX q14d x 4 cycles       CANCER STAGING:  Cancer Staging  Adenocarcinoma of colon with mucinous features Staging form: Colon and Rectum, AJCC 7th Edition - Clinical: Stage IIIB (T3, N1, M0) - Signed by Baird Cancer, PA on 09/22/2010  Pancreatic cancer [facility]) Staging form: Exocrine Pancreas, AJCC 8th Edition - Clinical stage from 10/07/2021: Stage IIB (cT1, cN1, cM0) - Unsigned    INTERVAL HISTORY:  Mr. Ostrovsky 72 y.o. male seen for follow-up of pancreatic cancer.  He was hospitalized for 10 days and discharged home on 04/13/2021 after he was found to have bacteremia with Staph epidermidis and Staph hominis 2 out of 2 sets.  He had a TTE with no vegetation.  Port was discontinued.  PICC line was placed.  He is currently receiving daptomycin daily which she will finish on 04/22/2022.  He remains afebrile.  He is continuing on TPN via PICC line.  REVIEW OF SYSTEMS:  Review of Systems  Respiratory:  Positive for shortness of breath.   Gastrointestinal:  Positive for diarrhea and nausea.  Neurological:  Positive for numbness (feet).  Psychiatric/Behavioral:  Positive for sleep disturbance.   All other systems reviewed and are negative.    PAST MEDICAL/SURGICAL HISTORY:  Past Medical History:  Diagnosis Date   Adenocarcinoma of colon  with mucinous features 07/2010   Stage 3   Anemia    Anxiety    Arthritis    Barrett's esophagus    Blood transfusion    Bowel obstruction ([place]) 05/13/2012   Recurrent   Bronchitis    Chest pain at rest    Chronic abdominal pain    Erosive esophagitis    ETOH abuse    quit 03/2010   GERD (gastroesophageal reflux disease)    Hx of Clostridium difficile infection 01/2012   Hypertension    Ileus ([place])    Iron deficiency anemia 03/23/2016   Lung cancer ([place])    Obstruction of bowel ([place]) 03/03/2014   Osteoporosis    Pancreatic cancer ([facility])    Personal history of PE (pulmonary embolism) 10/01/2010   Pneumonia    Pulmonary embolism ([place]) 02/2010   Recurrent upper respiratory infection (URI)    Renal disorder    S/P endoscopy 09/28/2010   erosive reflux esophagitis, Billroth I anatomy   S/P partial gastrectomy 1980s   Seizures ([place])    was on meds for 6 months, Unknown etiology, last seizure was 2017.   Shortness of breath    TIA (transient ischemic attack) 12/2009   Vitamin B12 deficiency    Past Surgical History:  Procedure Laterality Date   ABDOMINAL ADHESION SURGERY  03/04/15   @ [facility]   ABDOMINAL EXPLORATION SURGERY     abdominal sugery     for bowel obstruction x 8, all in 1980s, except for one  in 07/2010   APPENDECTOMY  1980s   Billroth 1 hemigastrectomy  1980s   per patient for benign duodenal tumor   BIOPSY  01/10/2022   Procedure: BIOPSY;  Surgeon: Eloise Harman, DO;  [facility];  Service: Endoscopy;;  gastric,ileum   CARDIAC CATHETERIZATION  07/17/2012   CHOLECYSTECTOMY  1980s   COLON SURGERY  May 2012   left hemicolectomy, colon cancer found at time of surgery for bowel obstruction   COLON SURGERY  10/12/2018   At [facility]: Resection of right colon and terminal ileum with creation of ileorectal anastomosis for large bowel obstruction.  Cecum and ascending colon noted to be attached to the rectum.   COLONOSCOPY  03/18/2011   anastomosis at 35cm.  Several adenomatous polyps removed. Sigmoid diverticulosis. Next TCS 02/2013   COLONOSCOPY N/A 07/24/2012   QAS:TMHDQQ post segmental resection with normal-appearing colonic anastomosis aside from an adjacent polyp-removed as described above. Rectal polyp-removed as described above. CT findings appear to have been artifactual. tubular adenomas/prolapsed type polyp.   COLONOSCOPY N/A 05/15/2015   Procedure: COLONOSCOPY;  Surgeon: [name], MD;  Location: [facility];  Service: Endoscopy;  Laterality: N/A;   COLONOSCOPY  09/26/2018   at [facility]: prep not adequate for colon cancer surveillance.  Prior end-to-end colonic anastomosis in the rectosigmoid region.  This was patent and characterized by healthy-appearing mucosa.  Anastomosis was traversed.  Normal terminal ileum.   COLONOSCOPY WITH PROPOFOL N/A 11/04/2016   Dr. [name], status post subtotal colectomy with normal-appearing residual lower GI tract.  Next colonoscopy in 5 years.   ENTEROSCOPY N/A 04/07/2022   Procedure: ENTEROSCOPY;  Surgeon: [name], MD;  Location: [facility];  Service: Endoscopy;  Laterality: N/A;   ESOPHAGOGASTRODUODENOSCOPY  09/28/2010   ESOPHAGOGASTRODUODENOSCOPY  12/01/2010   Cervical web status post dilation, erosive esophagitis, B1 hemigastrectomy, inflamed anastomosis   ESOPHAGOGASTRODUODENOSCOPY  04/16/2011   excoriation at [facility] c/w trauma/M-W tear, friable gastric anastomosis, dilation efferent limb   ESOPHAGOGASTRODUODENOSCOPY N/A 06/03/2014   Dr.[name]- cervcal esopphageal web s/p dilation. abnormal distal esophagus bx= barretts esophagus   ESOPHAGOGASTRODUODENOSCOPY N/A 05/15/2015   Procedure: ESOPHAGOGASTRODUODENOSCOPY (EGD);  Surgeon: [name], MD;  [facility];  Service: Endoscopy;  Laterality: N/A;  230   ESOPHAGOGASTRODUODENOSCOPY (EGD) WITH ESOPHAGEAL DILATION  02/25/2012   IWL:NLGXQJJH esophageal web-s/p dilation anddisruption as described above. Status post prior gastric with Billroth  I configuration. Abnormal gastric mucosa at the anastomosis. Gastric biopsy showed mild chronic inflammation but no H. pylori    ESOPHAGOGASTRODUODENOSCOPY (EGD) WITH PROPOFOL N/A 11/04/2016   Dr. [name]: Reflux esophagitis, small hiatal hernia status post hemigastrectomy.  Single patent efferent small bowel limb appeared normal, 2 x 2 centimeter tongue of salmon epithelium again seen, esophageal dilation.  Biopsies consistent with reflux changes, not Barrett's however this was confirmed on prior EGDs.  Offer 3-year follow-up EGD August 2021.   ESOPHAGOGASTRODUODENOSCOPY (EGD) WITH PROPOFOL N/A 03/03/2020   Procedure: ESOPHAGOGASTRODUODENOSCOPY (EGD) WITH PROPOFOL;  Surgeon: [name], MD;  [facility];  Service: Endoscopy;  Laterality: N/A;  3:00pm   ESOPHAGOGASTRODUODENOSCOPY (EGD) WITH PROPOFOL N/A 01/10/2022   Procedure: ESOPHAGOGASTRODUODENOSCOPY (EGD) WITH PROPOFOL;  Surgeon: Eloise Harman, DO;  [facility];  Service: Endoscopy;  Laterality: N/A;   ESOPHAGOGASTRODUODENOSCOPY (EGD) WITH PROPOFOL N/A 04/07/2022   Procedure: ESOPHAGOGASTRODUODENOSCOPY (EGD) WITH PROPOFOL;  Surgeon: [name], MD;  [facility];  Service: Endoscopy;  Laterality: N/A;   FLEXIBLE SIGMOIDOSCOPY N/A 01/10/2022  Procedure: FLEXIBLE SIGMOIDOSCOPY;  Surgeon: Eloise Harman, DO;  [facility];  Service: Endoscopy;  Laterality: N/A;   HERNIA REPAIR     right inguinal   HOT HEMOSTASIS  04/07/2022   Procedure: HOT HEMOSTASIS (ARGON PLASMA COAGULATION/BICAP);  Surgeon: [name], MD;  [facility];  Service: Endoscopy;;   MALONEY DILATION N/A 06/03/2014   Procedure: Keturah Shavers;  Surgeon: [name], MD;  [facility];  Service: Endoscopy;  Laterality: N/A;   MALONEY DILATION N/A 05/15/2015   Procedure: [name] DILATION;  Surgeon: [name], MD;  [facility];  Service: Endoscopy;  Laterality: N/A;   MALONEY  DILATION N/A 11/04/2016   Procedure: [name] DILATION;  Surgeon: [name], MD;  [facility];  Service: Endoscopy;  Laterality: N/A;   MALONEY DILATION N/A 03/03/2020   Procedure: [name] DILATION;  Surgeon: [name], MD;  [facility];  Service: Endoscopy;  Laterality: N/A;   PORT-A-CATH REMOVAL Left 04/08/2022   Procedure: MINOR REMOVAL PORT-A-CATH;  Surgeon: Rusty Aus, DO;  Location: [facility];  Service: General;  Laterality: Left;   PORTACATH PLACEMENT     SAVORY DILATION N/A 06/03/2014   Procedure: SAVORY DILATION;  Surgeon: [name], MD;  [facility];  Service: Endoscopy;  Laterality: N/A;   SUBMUCOSAL INJECTION  04/07/2022   Procedure: SUBMUCOSAL INJECTION;  Surgeon: [name], MD;  [facility];  Service: Endoscopy;;   SUBMUCOSAL TATTOO INJECTION  04/07/2022   Procedure: SUBMUCOSAL TATTOO INJECTION;  Surgeon: [name], MD;  [facility];  Service: Endoscopy;;   TEE WITHOUT CARDIOVERSION N/A 04/09/2022   Procedure: TRANSESOPHAGEAL ECHOCARDIOGRAM (TEE);  Surgeon: Arnoldo Lenis, MD;  Location: [facility];  Service: Endoscopy;  Laterality: N/A;     SOCIAL HISTORY:  Social History   Socioeconomic History   Marital status: Married    Spouse name: Not on file   Number of children: 3   Years of education: Not on file   Highest education level: Not on file  Occupational History    Employer: [employer]  Tobacco Use   Smoking status: Former    Packs/day: 0.50    Years: 40.00    Total pack years: 20.00    Types: Cigarettes    Quit date: 12/20/2012    Years since quitting: 9.3   Smokeless tobacco: Never  Vaping Use   Vaping Use: Never used  Substance and Sexual Activity   Alcohol use: No   Drug use: No   Sexual activity: Not Currently  Other Topics Concern   Not on file  Social History Narrative   Not on file   Social Determinants of Health   Financial Resource Strain: Not  on file  Food Insecurity: No Food Insecurity (04/03/2022)   Hunger Vital Sign    Worried About Running Out of Food in the Last Year: Never true    Ran Out of Food in the Last Year: Never true  Transportation Needs: No Transportation Needs (04/03/2022)   PRAPARE - Hydrologist (Medical): No    Lack of Transportation (Non-Medical): No  Physical Activity: Not on file  Stress: Not on file  Social Connections: Not on file  Intimate Partner Violence: Not At Risk (04/03/2022)   Humiliation, Afraid, Rape, and Kick questionnaire    Fear of Current or Ex-Partner: No    Emotionally Abused: No  Physically Abused: No    Sexually Abused: No    FAMILY HISTORY:  Family History  Problem Relation Age of Onset   Hypertension Mother    Arthritis Mother    Pneumonia Mother    Hypertension Father    Heart attack Father    Bone cancer Cousin    Colon cancer Neg Hx     CURRENT MEDICATIONS:  Outpatient Encounter Medications as of 04/19/2022  Medication Sig Note   apixaban (ELIQUIS) 5 MG TABS tablet Take 1 tablet (5 mg total) by mouth 2 (two) times daily. Restart 01/11/22    [START ON 04/23/2022] atorvastatin (LIPITOR) 20 MG tablet Take 1 tablet (20 mg total) by mouth daily.    Bacillus Coagulans-Inulin (PROBIOTIC) 1-250 BILLION-MG CAPS Take 1 capsule by mouth daily.    bacitracin ointment Apply topically daily.    chlorproMAZINE (THORAZINE) 25 MG tablet Take 1 tablet (25 mg total) by mouth every 6 (six) hours as needed. (Patient taking differently: Take 25 mg by mouth every 6 (six) hours as needed for nausea or vomiting.)    Cholecalciferol (VITAMIN D) 2000 units CAPS Take 2,000 Units by mouth daily.    Cholecalciferol (VITAMIN D3) 10 MCG (400 UNIT) tablet Take 400 Units by mouth daily.    CREON 36000-114000 units CPEP capsule Take 72,000-108,000 Units by mouth See admin instructions. Take 3 capsules (108,000 units) by mouth with meals and take 2 capsules (72,000 units) with  snacks.    cyanocobalamin 100 MCG tablet Take by mouth.    daptomycin (CUBICIN) IVPB Inject 450 mg into the vein daily for 13 days. Indication:  MRSE bacteremia / infected port First Dose: No Last Day of Therapy:  04/22/2022 Labs - Once weekly:  CBC/D, BMP, and CPK Labs - Every other week:  ESR and CRP Method of administration: IV Push Method of administration may be changed at the discretion of home infusion pharmacist based upon assessment of the patient and/or caregiver's ability to self-administer the medication ordered.    diclofenac Sodium (VOLTAREN) 1 % GEL Apply 4 g topically 4 (four) times daily.    dicyclomine (BENTYL) 10 MG capsule Take 10 mg by mouth 2 (two) times daily before a meal.    diphenoxylate-atropine (LOMOTIL) 2.5-0.025 MG tablet Take 1 tablet by mouth 4 times daily    escitalopram (LEXAPRO) 20 MG tablet Take 1 tablet (20 mg total) by mouth daily.    feeding supplement (ENSURE ENLIVE / ENSURE PLUS) LIQD Take 237 mLs by mouth 2 (two) times daily between meals.    ferrous sulfate 325 (65 FE) MG EC tablet Take by mouth.    gabapentin (NEURONTIN) 100 MG capsule TAKE 1 CAPSULE BY MOUTH EVERY DAY AT BEDTIME (Patient taking differently: Take 100 mg by mouth at bedtime.)    HYDROcodone-acetaminophen (NORCO/VICODIN) 5-325 MG tablet Take 1 tablet by mouth every 8 (eight) hours as needed for moderate pain.    hydrocortisone cream 1 % Apply 1 Application topically 2 (two) times daily.    magnesium oxide (MAG-OX) 400 (240 Mg) MG tablet TAKE 1 TABLET BY MOUTH TWICE A DAY    megestrol (MEGACE) 40 MG/ML suspension TAKE 10 MLS (400 MG TOTAL) BY MOUTH 2 (TWO) TIMES DAILY.    mirtazapine (REMERON) 30 MG tablet TAKE 1 TABLET BY MOUTH AT BEDTIME    Multiple Vitamin (INFUVITE ADULT IV) 5 mLs See admin instructions. Inject 5 mls from each vial #1 and #2    Multiple Vitamin (MULTIVITAMIN WITH MINERALS) TABS tablet Take 1 tablet  by mouth daily.    ondansetron (ZOFRAN-ODT) 4 MG disintegrating tablet  TAKE 1 TABLET BY MOUTH EVERY 4 HOURS AS NEEDED FOR NAUSEA AND VOMITING    pantoprazole (PROTONIX) 40 MG tablet Take 1 tablet (40 mg total) by mouth 2 (two) times daily before a meal.    PREVALITE 4 g packet Take 1 packet (4 g total) by mouth daily. DO NOT TAKE WITHIN 2 HOURS OF OTHER MEDICATIONS    prochlorperazine (COMPAZINE) 10 MG tablet Take 10 mg by mouth every 6 (six) hours as needed for vomiting or nausea.    sterile water SOLN with Travasol 10 % SOLN 50 g/L, dextrose 70 % SOLN 20 %, sodium INJ 35 mEq/L, potassium INJ 30 mEq/L, calcium GLUCONATE INJ 4.7 mEq/L, magnesium SULFATE INJ 5 mEq/L, phosphorus INJ 15 mmol/L, M.V.I. Adult INJ 10 mL, trace elements Cr-Cu-Mn-Se-Zn (MTE-5 Concentrate) 12-998-500-60 MCG/ML SOLN 1 mL, fat emulsion 30 % EMUL 100 mL/L continuous. 04/03/2022: Pt's spouse adds 5 mls of vial #1 and #2 to bag then administer every 24 hours    sucralfate (CARAFATE) 1 GM/10ML suspension TAKE 10 MLS (1 G TOTAL) BY MOUTH 4 (FOUR) TIMES DAILY AS NEEDED. FOR BREAKTHROUGH HEARTBURN (Patient taking differently: Take 1 g by mouth 4 (four) times daily as needed (breakthrough heartburn).)    traMADol (ULTRAM) 50 MG tablet Take 1 tablet by mouth every 6 (six) hours as needed for moderate pain or severe pain.    Facility-Administered Encounter Medications as of 04/19/2022  Medication   acetaminophen (TYLENOL) 325 MG tablet   heparin lock flush 100 unit/mL   loratadine (CLARITIN) 10 MG tablet   sodium chloride flush (NS) 0.9 % injection 10 mL    ALLERGIES:  No Known Allergies   PHYSICAL EXAM:  ECOG Performance status: 1  Vitals:   04/19/22 1425  BP: 139/84  Pulse: 91  Resp: 20  Temp: 97.8 F (36.6 C)  SpO2: 100%    Filed Weights   04/19/22 1425 04/19/22 1435  Weight: 125 lb (56.7 kg) 123 lb 12.8 oz (56.2 kg)    Physical Exam Vitals reviewed.  Constitutional:      Appearance: Normal appearance.  Cardiovascular:     Rate and Rhythm: Normal rate and regular rhythm.      Heart sounds: Normal heart sounds.  Pulmonary:     Breath sounds: Normal breath sounds.  Abdominal:     Palpations: Abdomen is soft. There is no mass.  Neurological:     General: No focal deficit present.     Mental Status: He is alert and oriented to person, place, and time.  Psychiatric:        Mood and Affect: Mood normal.        Behavior: Behavior normal.      LABORATORY DATA:  I have reviewed the labs as listed.  CBC    Component Value Date/Time   WBC 9.1 04/19/2022 1358   RBC 3.14 (L) 04/19/2022 1358   HGB 9.8 (L) 04/19/2022 1358   HCT 31.1 (L) 04/19/2022 1358   PLT 223 04/19/2022 1358   MCV 99.0 04/19/2022 1358   MCH 31.2 04/19/2022 1358   MCHC 31.5 04/19/2022 1358   RDW 16.7 (H) 04/19/2022 1358   LYMPHSABS 1.8 04/19/2022 1358   MONOABS 0.7 04/19/2022 1358   EOSABS 0.1 04/19/2022 1358   BASOSABS 0.0 04/19/2022 1358      Latest Ref Rng & Units 04/19/2022    1:58 PM 04/12/2022    4:43 AM 04/11/2022  4:54 AM  CMP  Glucose 70 - 99 mg/dL 120  118  92   BUN 8 - 23 mg/dL 21  11  13    Creatinine 0.61 - 1.24 mg/dL 1.20  1.16  1.14   Sodium 135 - 145 mmol/L 133  137  138   Potassium 3.5 - 5.1 mmol/L 5.1  4.4  4.3   Chloride 98 - 111 mmol/L 108  115  114   CO2 22 - 32 mmol/L 18  20  20    Calcium 8.9 - 10.3 mg/dL 7.7  7.6  7.5   Total Protein 6.5 - 8.1 g/dL 6.8  5.2  5.0   Total Bilirubin 0.3 - 1.2 mg/dL 0.2  0.3  0.3   Alkaline Phos 38 - 126 U/L 100  80  74   AST 15 - 41 U/L 39  20  22   ALT 0 - 44 U/L 40  30  32     DIAGNOSTIC IMAGING:  I have independently reviewed the scans and discussed with the patient.  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  No lymphadenopathy. - EUS and FNA (09/21/2021): Oval mass in the pancreatic tail, hypoechoic, 20 mm x 17 mm.  Remainder of the pancreas shows decreased visualization of  the pancreas. - Pathology: Invasive adenocarcinoma, ductal type, moderately to poorly differentiated.  Background desmoplastic stroma, blood and pancreatic tissue.  Morphologically distinct from prior lung adenocarcinoma.  TTF-1 and Napsin A were negative. - Patient evaluated by Dr. Dayton Scrape at [facility] for pancreatic resection. - CA 19-9 (10/02/2021): 6.47 - PET CT scan (10/02/2021): Ill-defined 4 cm mass in the left upper quadrant in the region of the pancreatic tail with mild hypermetabolism, SUV 4.7.  Soft tissue nodule in the gastrohepatic ligament measuring 2 x 1.3 cm, SUV 4.0.  Suspicious for metastatic lymphadenopathy.  9 mm mediastinal lymph node and AP window is hypermetabolic SUV 4.9.  Borderline enlarged left hilar lymph nodes show hypermetabolic activity SUV 3.4.  Previously seen subpleural nodule in the superior right lower lobe has nearly completely resolved.  Overall mild hypermetabolic mediastinal and left hilar lymph nodes which are nonspecific.  May be reactive in etiology although metastatic disease cannot definitely be excluded.        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 -Last EGD/colonoscopy on 05/15/2015 was normal. - CT scan in October 2018 was negative for metastatic disease. -Bowel resection in July last year secondary to small bowel obstruction at 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  Margins negative.  2 hilar lymph nodes were negative. - No adjuvant chemotherapy was needed.    Social/family history: - Lives at home with his wife.  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  No chemical exposure.  Quit smoking in 2014 and smoked 1 pack/day  for 44 years. - Paternal cousin has bone cancer.    PLAN:   Stage IIB adenocarcinoma of the pancreatic tail: - Last chemotherapy was on 12/30/2021.  Last PET scan was 01/14/2022 which showed decrease in size and FDG activity of the pancreatic tail mass.  Resolution of masslike hypermetabolic nodularity in the gastrohepatic ligament.  Resolution of hypermetabolic mediastinal lymph nodes. - Surgery was held due to low albumin levels and weight loss. - His weight is improving and albumin is also improving. - I have recommended restaging PET CT scan.  I will update Dr. Gleason at [facility] of the progress.   2.  Normocytic anemia: - Hemoglobin today is 9.8 with ferritin of 536 and percent saturation of 31.  No indication for parenteral iron therapy.   3.  Chronic diarrhea: - Continue Imodium and Lomotil as needed.   4.  Weight loss: - He is continuing TPN via PICC line. - Weight has improved to 123.8 pounds, 117 pounds at previous visit. - Albumin improved to 2.8.     Orders placed this encounter:  Orders Placed This Encounter  Procedures   NM PET Image Restag (PS) Skull Base To Thigh        Derek Jack, MD [place] [phone]

## 2022-04-20 ENCOUNTER — Other Ambulatory Visit: Payer: Self-pay

## 2022-04-20 ENCOUNTER — Ambulatory Visit (INDEPENDENT_AMBULATORY_CARE_PROVIDER_SITE_OTHER): Payer: Medicare Other | Admitting: Surgery

## 2022-04-20 ENCOUNTER — Encounter: Payer: Self-pay | Admitting: Surgery

## 2022-04-20 VITALS — BP 124/80 | HR 89 | Temp 98.2°F | Resp 16 | Ht 68.0 in | Wt 129.6 lb

## 2022-04-20 DIAGNOSIS — Z09 Encounter for follow-up examination after completed treatment for conditions other than malignant neoplasm: Secondary | ICD-10-CM

## 2022-04-20 NOTE — Progress Notes (Unsigned)
[facility] Note   HPI:  72 y.o. Male presents to clinic for post-op follow-up status post removal of left subclavian Port-A-Cath removal on 1/18 secondary to Staph epidermidis bacteremia.  Patient has been doing well since discharge from the hospital, though he did represent to the emergency department secondary to bleeding from his right upper extremity PICC line.  He denies any complaints related to his left port site.  He denies any pain, fever, or chills.  Review of Systems:  All other review of systems: otherwise negative   Vital Signs:  BP 124/80   Pulse 89   Temp 98.2 F (36.8 C) (Oral)   Resp 16   Ht 5\' 8"  (1.727 m)   Wt 129 lb 9.6 oz (58.8 kg)   SpO2 98%   BMI 19.71 kg/m    Physical Exam:  Physical Exam Vitals reviewed.  Constitutional:      Appearance: Normal appearance.  Chest:     Comments: Left chest Port-A-Cath removal site C/D/I and healing well Neurological:     Mental Status: He is alert.     Laboratory studies: None  Imaging:  None  Assessment:  72 y.o. yo Male who presents status post left subclavian Port-A-Cath removal on 1/18 for Staph epidermidis bacteremia.  Plan:  -Patient is doing very well from port removal site -Advised him that he can leave this site open to air -He is scheduled to undergo a PET scan later this week, and will be following up with his surgeon at [facility] thereafter.  He is also currently still receiving IV antibiotics for his bacteremia.  I recommend against replacement of Port-A-Cath until he is evaluated by his surgeon and until he is off of IV antibiotics -Follow up with me as needed  All of the above recommendations were discussed with the patient and patient's family, and all of patient's and family's questions were answered to their expressed satisfaction.  Graciella Freer, DO [facility] [address] [place], [place] [address] [phone] (office)

## 2022-04-21 ENCOUNTER — Other Ambulatory Visit: Payer: Self-pay

## 2022-04-21 ENCOUNTER — Ambulatory Visit (INDEPENDENT_AMBULATORY_CARE_PROVIDER_SITE_OTHER): Payer: Medicare Other | Admitting: Internal Medicine

## 2022-04-21 ENCOUNTER — Encounter: Payer: Self-pay | Admitting: Internal Medicine

## 2022-04-21 VITALS — BP 119/77 | HR 101 | Temp 98.0°F | Wt 127.0 lb

## 2022-04-21 DIAGNOSIS — R7881 Bacteremia: Secondary | ICD-10-CM | POA: Diagnosis present

## 2022-04-21 DIAGNOSIS — B957 Other staphylococcus as the cause of diseases classified elsewhere: Secondary | ICD-10-CM

## 2022-04-21 NOTE — Progress Notes (Unsigned)
Patient: [name]  DOB: 1950-10-12 MRN: [id number] PCP: [name], MD   Patient Active Problem List   Diagnosis Date Noted   Melena 04/06/2022   Coag negative Staphylococcus bacteremia 04/03/2022   Genetic testing 03/02/2022   Heme positive stool 01/09/2022   Current use of long term anticoagulation    Symptomatic anemia 01/08/2022   Pancreatic cancer ([place]) 10/07/2021   Loss of weight 09/12/2019   Pancreatic insufficiency 05/03/2019   Dyslipidemia 05/03/2019   B12 deficiency 05/03/2019   Anxiety 05/03/2019   Early satiety 04/25/2019   Seizure disorder ([place]) 09/16/2016   CKD (chronic kidney disease) stage 3, GFR 30-59 ml/min ([place]) 06/14/2016   Patient has nasogastric tube    Ileus ([place]) 05/15/2016   Iron deficiency anemia 03/23/2016   Acute renal failure ([place]) 03/19/2016   Normocytic anemia 03/19/2016   Hypotension 03/19/2016   Anemia    History of colon cancer, stage III    Hx of colon cancer, stage III    History of colonic polyps    Barrett's esophagus without dysplasia    Dysphagia    Essential hypertension 11/30/2014   Dysphagia, pharyngoesophageal phase    Mucosal abnormality of esophagus    SBO (small bowel obstruction) ([place]) 09/17/2013   Rectal bleeding 07/11/2012   Bowel obstruction ([place]) 07/11/2012   Abnormal CT scan, colon 07/11/2012   Esophageal dysphagia 02/22/2012   H/O Clostridium difficile infection 02/22/2012   Diarrhea 09/10/2011   Cellulitis 09/10/2011   Nausea 06/07/2011   Abdominal distention 06/07/2011   HTN (hypertension) 06/07/2011   Partial small bowel obstruction ([place]) 05/31/2011   GI bleed 05/27/2011   Coagulopathy ([place]) 05/27/2011   Gastroenteritis 05/27/2011   Small bowel obstruction ([place]) 05/13/2011   LUQ pain 05/13/2011   Pulmonary embolism ([place]) 01/17/2011   Chest pain at rest 01/17/2011   Pneumonia 12/11/2010   Hematemesis 12/05/2010   Chronic abdominal pain 12/05/2010   Pancytopenia due to antineoplastic  chemotherapy ([place]) 12/05/2010   Coffee ground emesis 11/29/2010   Esophageal reflux disease 11/15/2010   S/P partial gastrectomy 10/01/2010   Personal history of PE (pulmonary embolism) 10/01/2010   Bronchitis, acute 10/01/2010   Erosive esophagitis 10/01/2010   Fever with chills 09/29/2010   Supratherapeutic INR 09/26/2010   Abdominal pain 09/26/2010   Nausea and vomiting 09/26/2010   Anemia, chronic disease 09/26/2010   Adenocarcinoma of colon with mucinous features 09/22/2010     Subjective:  [name] is a 72 y.o. male with past medical history significant for stage III adenocarcinoma, GERD, hypertension, posterior gastrectomy, pancreatic cancer, PE on AC, chronic anemia anemia presents for hospital follow-up of MRSA bacteremia.  Blood cultures on admission grew 2/2 staph epi repeat blood cultures on 1/13 grew 2/2 staph epi and 1/2 Staph hominis, 1/15 port cultures grew staph epi and Staph hominis.  He was started on vancomycin on 1/14.  Noted that he was getting TPN for about 2 weeks for prep for surgical resection of pancreatic cancer through which chest port that has had for 11 years MRSA bacteremia suspected secondary to port.  Port was pulled on 1/18, repeat pulse cultures on 1/19 with no growth.  TTE and TEE were negative for vegetation.  Discharged on 2 weeks antibiotics from negative cultures EOT 2/1. Today 04/21/2022: Pt  presents for follow Review of Systems  All other systems reviewed and are negative.   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  Stage 3   Anemia    Anxiety    Arthritis    Barrett's esophagus    Blood transfusion    Bowel obstruction ([facility]) 05/13/2012   Recurrent   Bronchitis    Chest pain at rest    Chronic abdominal pain    Erosive esophagitis    ETOH abuse    quit 03/2010   GERD (gastroesophageal reflux disease)    Hx of Clostridium difficile infection 01/2012   Hypertension    Ileus  ([place])    Iron deficiency anemia 03/23/2016   Lung cancer ([place])    Obstruction of bowel ([place]) 03/03/2014   Osteoporosis    Pancreatic cancer ([place])    Personal history of PE (pulmonary embolism) 10/01/2010   Pneumonia    Pulmonary embolism ([place]) 02/2010   Recurrent upper respiratory infection (URI)    Renal disorder    S/P endoscopy 09/28/2010   erosive reflux esophagitis, [place] I anatomy   S/P partial gastrectomy 1980s   Seizures ([place])    was on meds for 6 months, Unknown etiology, last seizure was 2017.   Shortness of breath    TIA (transient ischemic attack) 12/2009   Vitamin B12 deficiency     Outpatient Medications Prior to Visit  Medication Sig Dispense Refill   apixaban (ELIQUIS) 5 MG TABS tablet Take 1 tablet (5 mg total) by mouth 2 (two) times daily. Restart 01/11/22 (Patient not taking: Reported on 04/20/2022) 60 tablet 6   [START ON 04/23/2022] atorvastatin (LIPITOR) 20 MG tablet Take 1 tablet (20 mg total) by mouth daily. (Patient not taking: Reported on 04/20/2022)     Bacillus Coagulans-Inulin (PROBIOTIC) 1-250 BILLION-MG CAPS Take 1 capsule by mouth daily.     bacitracin ointment Apply topically daily. 120 g 0   chlorproMAZINE (THORAZINE) 25 MG tablet Take 1 tablet (25 mg total) by mouth every 6 (six) hours as needed. (Patient taking differently: Take 25 mg by mouth every 6 (six) hours as needed for nausea or vomiting.) 30 tablet 0   Cholecalciferol (VITAMIN D) 2000 units CAPS Take 2,000 Units by mouth daily.     CREON 36000-114000 units CPEP capsule Take 72,000-108,000 Units by mouth See admin instructions. Take 3 capsules (108,000 units) by mouth with meals and take 2 capsules (72,000 units) with snacks.     cyanocobalamin 100 MCG tablet Take by mouth.     daptomycin (CUBICIN) IVPB Inject 450 mg into the vein daily for 13 days. Indication:  MRSE bacteremia / infected port First Dose: No Last Day of Therapy:  04/22/2022 Labs - Once weekly:  CBC/D, BMP, and CPK Labs -  Every other week:  ESR and CRP Method of administration: IV Push Method of administration may be changed at the discretion of home infusion pharmacist based upon assessment of the patient and/or caregiver's ability to self-administer the medication ordered. 13 Units 0   diclofenac Sodium (VOLTAREN) 1 % GEL Apply 4 g topically 4 (four) times daily.     dicyclomine (BENTYL) 10 MG capsule Take 10 mg by mouth 2 (two) times daily before a meal.     diphenoxylate-atropine (LOMOTIL) 2.5-0.025 MG tablet Take 1 tablet by mouth 4 times daily 120 tablet 1   escitalopram (LEXAPRO) 20 MG tablet Take 1 tablet (20 mg total) by mouth daily. 90 tablet 0   feeding supplement (ENSURE ENLIVE / ENSURE PLUS) LIQD Take 237 mLs by mouth 2 (two) times daily between meals.     ferrous sulfate 325 (65 FE) MG EC  tablet Take by mouth.     gabapentin (NEURONTIN) 100 MG capsule TAKE 1 CAPSULE BY MOUTH EVERY DAY AT BEDTIME (Patient taking differently: Take 100 mg by mouth at bedtime.) 30 capsule 6   HYDROcodone-acetaminophen (NORCO/VICODIN) 5-325 MG tablet Take 1 tablet by mouth every 8 (eight) hours as needed for moderate pain. 45 tablet 0   hydrocortisone cream 1 % Apply 1 Application topically 2 (two) times daily.     magnesium oxide (MAG-OX) 400 (240 Mg) MG tablet TAKE 1 TABLET BY MOUTH TWICE A DAY 180 tablet 3   megestrol (MEGACE) 40 MG/ML suspension TAKE 10 MLS (400 MG TOTAL) BY MOUTH 2 (TWO) TIMES DAILY. 480 mL 1   mirtazapine (REMERON) 30 MG tablet TAKE 1 TABLET BY MOUTH AT BEDTIME 30 tablet 3   Multiple Vitamin (INFUVITE ADULT IV) 5 mLs See admin instructions. Inject 5 mls from each vial #1 and #2     Multiple Vitamin (MULTIVITAMIN WITH MINERALS) TABS tablet Take 1 tablet by mouth daily.     ondansetron (ZOFRAN-ODT) 4 MG disintegrating tablet TAKE 1 TABLET BY MOUTH EVERY 4 HOURS AS NEEDED FOR NAUSEA AND VOMITING 30 tablet 1   pantoprazole (PROTONIX) 40 MG tablet Take 1 tablet (40 mg total) by mouth 2 (two) times daily  before a meal. 180 tablet 3   PREVALITE 4 g packet Take 1 packet (4 g total) by mouth daily. DO NOT TAKE WITHIN 2 HOURS OF OTHER MEDICATIONS 90 packet 3   prochlorperazine (COMPAZINE) 10 MG tablet Take 10 mg by mouth every 6 (six) hours as needed for vomiting or nausea.     sterile water SOLN with Travasol 10 % SOLN 50 g/L, dextrose 70 % SOLN 20 %, sodium INJ 35 mEq/L, potassium INJ 30 mEq/L, calcium GLUCONATE INJ 4.7 mEq/L, magnesium SULFATE INJ 5 mEq/L, phosphorus INJ 15 mmol/L, M.V.I. Adult INJ 10 mL, trace elements Cr-Cu-Mn-Se-Zn (MTE-5 Concentrate) 12-998-500-60 MCG/ML SOLN 1 mL, fat emulsion 30 % EMUL 100 mL/L continuous.     sucralfate (CARAFATE) 1 GM/10ML suspension TAKE 10 MLS (1 G TOTAL) BY MOUTH 4 (FOUR) TIMES DAILY AS NEEDED. FOR BREAKTHROUGH HEARTBURN (Patient taking differently: Take 1 g by mouth 4 (four) times daily as needed (breakthrough heartburn).) 420 mL 5   traMADol (ULTRAM) 50 MG tablet Take 1 tablet by mouth every 6 (six) hours as needed for moderate pain or severe pain.     Facility-Administered Medications Prior to Visit  Medication Dose Route Frequency Provider Last Rate Last Admin   acetaminophen (TYLENOL) 325 MG tablet            heparin lock flush 100 unit/mL  500 Units Intravenous Once Kefalas, Thomas S, PA-C       loratadine (CLARITIN) 10 MG tablet            sodium chloride flush (NS) 0.9 % injection 10 mL  10 mL Intravenous PRN Holley Bouche, NP   10 mL at 11/05/16 1136     No Known Allergies  Social History   Tobacco Use   Smoking status: Former    Packs/day: 0.50    Years: 40.00    Total pack years: 20.00    Types: Cigarettes    Quit date: 12/20/2012    Years since quitting: 9.3   Smokeless tobacco: Never  Vaping Use   Vaping Use: Never used  Substance Use Topics   Alcohol use: No   Drug use: No    Family History  Problem Relation Age of Onset  Hypertension Mother    Arthritis Mother    Pneumonia Mother    Hypertension Father    Heart  attack Father    Bone cancer Cousin    Colon cancer Neg Hx     Objective:  There were no vitals filed for this visit. There is no height or weight on file to calculate BMI.  Physical Exam Constitutional:      General: He is not in acute distress.    Appearance: He is normal weight. He is not toxic-appearing.  HENT:     Head: Normocephalic and atraumatic.     Right Ear: External ear normal.     Left Ear: External ear normal.     Nose: No congestion or rhinorrhea.     Mouth/Throat:     Mouth: Mucous membranes are moist.     Pharynx: Oropharynx is clear.  Eyes:     Extraocular Movements: Extraocular movements intact.     Conjunctiva/sclera: Conjunctivae normal.     Pupils: Pupils are equal, round, and reactive to light.  Cardiovascular:     Rate and Rhythm: Normal rate and regular rhythm.     Heart sounds: No murmur heard.    No friction rub. No gallop.  Pulmonary:     Effort: Pulmonary effort is normal.     Breath sounds: Normal breath sounds.  Abdominal:     General: Abdomen is flat. Bowel sounds are normal.     Palpations: Abdomen is soft.  Musculoskeletal:        General: No swelling. Normal range of motion.     Cervical back: Normal range of motion and neck supple.  Skin:    General: Skin is warm and dry.     Comments: Left chest port wound without signs of infection  Neurological:     General: No focal deficit present.     Mental Status: He is oriented to person, place, and time.  Psychiatric:        Mood and Affect: Mood normal.     Lab Results: Lab Results  Component Value Date   WBC 9.1 04/19/2022   HGB 9.8 (L) 04/19/2022   HCT 31.1 (L) 04/19/2022   MCV 99.0 04/19/2022   PLT 223 04/19/2022    Lab Results  Component Value Date   CREATININE 1.20 04/19/2022   BUN 21 04/19/2022   NA 133 (L) 04/19/2022   K 5.1 04/19/2022   CL 108 04/19/2022   CO2 18 (L) 04/19/2022    Lab Results  Component Value Date   ALT 40 04/19/2022   AST 39 04/19/2022    ALKPHOS 100 04/19/2022   BILITOT 0.2 (L) 04/19/2022     Assessment & Plan:  #MRSE bacteremia 2/2 line infection #Stage III Adenocarcinoma of colon  # Pancreatic adenocarcinoma #Left lung adenocarcinoma Blood Cx peripheral+ staph epi and hominis(1/12 2/2 staph epi. 1/13 1/2 staph epi and stpah hominis 1/2 staph epi), repeat cx form port on 1/15+ staph hominis, staph epi -Sens for staph epi 1/12 and 1/13 match -Port pulled on 1/18, Blood Cx NG on 1/19 -TTE and TEE did not  show no vegetation -Discharged on 2 weeks of daptomycin from negative Cx EOT 04/22/22 for line infection Plan: Pt reprots he made gt new port put in during surgery. Surgery pending PET scan tomorrow I gave pt an appt for video visit 2 weeks from now to verify PICC has been pulled as it is currently used for TPN  #Medication monitoring -Labs   #Chest  port x 11 years -Used October, 2023 for chemo, plans to restart chemo (date unclear) -On TPN x 2 weeks for prep for surgical resection of pancreatic ca.  -Pulled on 1/18    Laurice Record, MD [place]   04/21/22  8:38 AM

## 2022-04-22 ENCOUNTER — Encounter (HOSPITAL_COMMUNITY)
Admission: RE | Admit: 2022-04-22 | Discharge: 2022-04-22 | Disposition: A | Discharge status: Home / Self Care | Payer: Medicare Other | Source: Ambulatory Visit | Attending: Hematology | Admitting: Hematology

## 2022-04-22 DIAGNOSIS — C252 Malignant neoplasm of tail of pancreas: Secondary | ICD-10-CM | POA: Insufficient documentation

## 2022-04-22 MED ORDER — FLUDEOXYGLUCOSE F - 18 (FDG) INJECTION
6.3300 | Freq: Once | INTRAVENOUS | Status: AC | PRN
  Administered 2022-04-22: 6.33 via INTRAVENOUS

## 2022-04-23 ENCOUNTER — Other Ambulatory Visit: Payer: Self-pay

## 2022-04-23 MED ORDER — HYDROCODONE-ACETAMINOPHEN 5-325 MG PO TABS: 1.0000 | ORAL_TABLET | Freq: Three times a day (TID) | ORAL | 0 refills | Status: DC | PRN

## 2022-04-27 ENCOUNTER — Other Ambulatory Visit: Payer: Self-pay | Admitting: Hematology

## 2022-04-28 ENCOUNTER — Inpatient Hospital Stay: Payer: Medicare Other | Attending: Hematology | Admitting: Hematology

## 2022-04-28 VITALS — BP 155/84 | HR 98 | Temp 98.0°F | Resp 19 | Ht 68.0 in | Wt 131.2 lb

## 2022-04-28 DIAGNOSIS — C3492 Malignant neoplasm of unspecified part of left bronchus or lung: Secondary | ICD-10-CM | POA: Diagnosis not present

## 2022-04-28 DIAGNOSIS — R634 Abnormal weight loss: Secondary | ICD-10-CM | POA: Diagnosis not present

## 2022-04-28 DIAGNOSIS — R197 Diarrhea, unspecified: Secondary | ICD-10-CM | POA: Diagnosis not present

## 2022-04-28 DIAGNOSIS — C252 Malignant neoplasm of tail of pancreas: Secondary | ICD-10-CM | POA: Insufficient documentation

## 2022-04-28 DIAGNOSIS — Z85038 Personal history of other malignant neoplasm of large intestine: Secondary | ICD-10-CM | POA: Insufficient documentation

## 2022-04-28 DIAGNOSIS — Z808 Family history of malignant neoplasm of other organs or systems: Secondary | ICD-10-CM | POA: Diagnosis not present

## 2022-04-28 DIAGNOSIS — Z87891 Personal history of nicotine dependence: Secondary | ICD-10-CM | POA: Insufficient documentation

## 2022-04-28 NOTE — Patient Instructions (Signed)
[place] at [facility] Discharge Instructions   You were seen and examined today by Dr. Delton Coombes.  He reviewed the results of your PET scan. There are some areas lighting up in the bones in the shoulder and in the pelvic bones. These are suspicious for cancer spreading to the bones.   We will need to obtain a biopsy of the left pelvis bone to determine if this is cancer, and if it is cancer, what type of cancer it is, given your history of having multiple cancers.   We will arrange for this biopsy to be done in interventional radiology. This will be done in [place]. You will need a driver as they may give you sedation for this procedure.   Return as scheduled after biopsy.    Thank you for choosing [place] at [facility] to provide your oncology and hematology care.  To afford each patient quality time with our provider, please arrive at least 15 minutes before your scheduled appointment time.   If you have a lab appointment with the [facility] please come in thru the Main Entrance and check in at the main information desk.  You need to re-schedule your appointment should you arrive 10 or more minutes late.  We strive to give you quality time with our providers, and arriving late affects you and other patients whose appointments are after yours.  Also, if you no show three or more times for appointments you may be dismissed from the clinic at the providers discretion.     Again, thank you for choosing [facility].  Our hope is that these requests will decrease the amount of time that you wait before being seen by our physicians.       _____________________________________________________________  Should you have questions after your visit to [facility], please contact our office at [phone] and follow the prompts.  Our office hours are 8:00 a.m. and 4:30 p.m. Monday - Friday.  Please note that  voicemails left after 4:00 p.m. may not be returned until the following business day.  We are closed weekends and major holidays.  You do have access to a nurse 24-7, just call the main number to the clinic [phone] and do not press any options, hold on the line and a nurse will answer the phone.    For prescription refill requests, have your pharmacy contact our office and allow 72 hours.    Due to Covid, you will need to wear a mask upon entering the hospital. If you do not have a mask, a mask will be given to you at the Main Entrance upon arrival. For doctor visits, patients may have 1 support person age 66 or older with them. For treatment visits, patients can not have anyone with them due to social distancing guidelines and our immunocompromised population.

## 2022-04-28 NOTE — Progress Notes (Signed)
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   CURRENT THERAPY: FOLFIRINOX every 2 weeks  BRIEF ONCOLOGIC HISTORY:  Oncology History  Pancreatic cancer ([place])  10/07/2021 Initial Diagnosis   Pancreatic cancer ([place])   10/13/2021 - 11/19/2021 Chemotherapy   Patient is on Treatment Plan : PANCREAS Modified FOLFIRINOX q14d x 4 cycles     10/13/2021 -  Chemotherapy   Patient is on Treatment Plan : PANCREAS Modified FOLFIRINOX q14d x 4 cycles       CANCER STAGING:  Cancer Staging  Adenocarcinoma of colon with mucinous features Staging form: Colon and Rectum, AJCC 7th Edition - Clinical: Stage IIIB (T3, N1, M0) - Signed by Baird Cancer, PA on 09/22/2010  Pancreatic cancer [facility]) Staging form: Exocrine Pancreas, AJCC 8th Edition - Clinical stage from 10/07/2021: Stage IIB (cT1, cN1, cM0) - Unsigned    INTERVAL HISTORY:  Mr. Hase 72 y.o. male seen for follow-up of pancreatic cancer. He was last seen by me on 04/19/22. We discussed his NM PET restaging scan results from 04/22/22.  Today, he states that he is doing okay overall. His appetite level is at 100%. He states that he eats 3-4 times a day, eating small amounts at a time. His wife reports that he has started to go back for a second serving for the last several days. His energy level is at 50%. He reports continued left-sided abdominal pain of 4/10 severity. He denies any new pain.  He saw ID Dr. Laurice Record for follow up on 04/21/22. She advised on PICC line removal due to  his susceptibility for infection. He is not currently on any antibiotics. He is pending new port placement.  His home blood sugars have been around 120-130, with a max blood sugar of 243.  REVIEW OF SYSTEMS:  Review of Systems  Constitutional:  Negative for chills, fatigue and fever.  HENT:   Negative for lump/mass, mouth sores, nosebleeds, sore throat and trouble swallowing.   Respiratory:  Positive for shortness of breath. Negative for cough.   Cardiovascular:  Negative for chest pain, leg swelling and palpitations.  Gastrointestinal:  Positive for abdominal pain, diarrhea and nausea. Negative for constipation and vomiting.  Genitourinary:  Negative for bladder incontinence, difficulty urinating, dysuria, frequency, hematuria and nocturia.   Musculoskeletal:  Negative for arthralgias, back pain, flank pain, myalgias and neck pain.  Skin:  Negative for itching and rash.  Neurological:  Positive for numbness (burning in feet). Negative for dizziness and headaches.  Hematological:  Does not bruise/bleed easily.  Psychiatric/Behavioral:  Positive for sleep disturbance. Negative for depression and suicidal ideas. The patient is not nervous/anxious.   All other systems reviewed and are negative.    PAST MEDICAL/SURGICAL HISTORY:  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   Stage 3   Anemia    Anxiety    Arthritis    Barrett's esophagus    Blood transfusion    Bowel obstruction ([place]) 05/13/2012   Recurrent   Bronchitis    Chest pain at rest    Chronic  abdominal pain    Erosive esophagitis    ETOH abuse    quit 03/2010   GERD (gastroesophageal reflux disease)    Hx of Clostridium difficile infection 01/2012   Hypertension    Ileus ([place])    Iron deficiency anemia 03/23/2016   Lung cancer ([place])    Obstruction of bowel ([place]) 03/03/2014   Osteoporosis    Pancreatic cancer ([facility])    Personal history of PE (pulmonary embolism)  10/01/2010   Pneumonia    Pulmonary embolism ([place]) 02/2010   Recurrent upper respiratory infection (URI)    Renal disorder    S/P endoscopy 09/28/2010   erosive reflux esophagitis, Billroth I anatomy   S/P partial gastrectomy 1980s   Seizures ([place])    was on meds for 6 months, Unknown etiology, last seizure was 2017.   Shortness of breath    TIA (transient ischemic attack) 12/2009   Vitamin B12 deficiency    Past Surgical History:  Procedure Laterality Date   ABDOMINAL ADHESION SURGERY  03/04/15   @ [facility]   ABDOMINAL EXPLORATION SURGERY     abdominal sugery     for bowel obstruction x 8, all in 1980s, except for one in 07/2010   APPENDECTOMY  1980s   Billroth 1 hemigastrectomy  1980s   per patient for benign duodenal tumor   BIOPSY  01/10/2022   Procedure: BIOPSY;  Surgeon: Eloise Harman, DO;  [facility];  Service: Endoscopy;;  gastric,ileum   CARDIAC CATHETERIZATION  07/17/2012   CHOLECYSTECTOMY  1980s   COLON SURGERY  May 2012   left hemicolectomy, colon cancer found at time of surgery for bowel obstruction   COLON SURGERY  10/12/2018   At [facility]: Resection of right colon and terminal ileum with creation of ileorectal anastomosis for large bowel obstruction.  Cecum and ascending colon noted to be attached to the rectum.   COLONOSCOPY  03/18/2011   anastomosis at 35cm. Several adenomatous polyps removed. Sigmoid diverticulosis. Next TCS 02/2013   COLONOSCOPY N/A 07/24/2012   KDX:IPJASN post segmental resection with normal-appearing colonic anastomosis aside from an adjacent polyp-removed as described above. Rectal polyp-removed as described above. CT findings appear to have been artifactual. tubular adenomas/prolapsed type polyp.   COLONOSCOPY N/A 05/15/2015   Procedure: COLONOSCOPY;  Surgeon: [name], MD;  [facility];  Service: Endoscopy;  Laterality: N/A;   COLONOSCOPY  09/26/2018   at [facility]: prep not adequate for colon cancer surveillance.  Prior  end-to-end colonic anastomosis in the rectosigmoid region.  This was patent and characterized by healthy-appearing mucosa.  Anastomosis was traversed.  Normal terminal ileum.   COLONOSCOPY WITH PROPOFOL N/A 11/04/2016   Dr. [name], status post subtotal colectomy with normal-appearing residual lower GI tract.  Next colonoscopy in 5 years.   ENTEROSCOPY N/A 04/07/2022   Procedure: ENTEROSCOPY;  Surgeon: [name], MD;  [facility];  Service: Endoscopy;  Laterality: N/A;   ESOPHAGOGASTRODUODENOSCOPY  09/28/2010   ESOPHAGOGASTRODUODENOSCOPY  12/01/2010   Cervical web status post dilation, erosive esophagitis, B1 hemigastrectomy, inflamed anastomosis   ESOPHAGOGASTRODUODENOSCOPY  04/16/2011   [facility]cor[facility] c/w trauma/M-W tear, friable gastric anastomosis, dilation efferent limb   ESOPHAGOGASTRODUODENOSCOPY N/A 06/03/2014   Dr.[name]- cervcal esopphageal web s/p dilation. abnormal distal esophagus bx= barretts esophagus   ESOPHAGOGASTRODUODENOSCOPY N/A 05/15/2015   Procedure: ESOPHAGOGASTRODUODENOSCOPY (EGD);  Surgeon: [name], MD;  [facility];  Service: Endoscopy;  Laterality: N/A;  230   ESOPHAGOGASTRODUODENOSCOPY (EGD) WITH ESOPHAGEAL DILATION  02/25/2012   GYI:RSWNIOEV esophageal web-s/p dilation anddisruption as described above. Status post prior gastric with Billroth I configuration. Abnormal gastric mucosa at the anastomosis. Gastric biopsy showed mild chronic inflammation but no H. pylori    ESOPHAGOGASTRODUODENOSCOPY (EGD) WITH PROPOFOL N/A 11/04/2016   Dr. [name]: Reflux esophagitis, small hiatal hernia status post hemigastrectomy.  Single patent efferent small bowel limb appeared normal, 2 x 2 centimeter tongue of salmon epithelium again seen, esophageal dilation.  Biopsies consistent with reflux changes, not Barrett's however this was confirmed on prior EGDs.  Offer 3-year follow-up EGD August 2021.   ESOPHAGOGASTRODUODENOSCOPY (EGD) WITH PROPOFOL N/A 03/03/2020    Procedure: ESOPHAGOGASTRODUODENOSCOPY (EGD) WITH PROPOFOL;  Surgeon: [name], MD;  [facility];  Service: Endoscopy;  Laterality: N/A;  3:00pm   ESOPHAGOGASTRODUODENOSCOPY (EGD) WITH PROPOFOL N/A 01/10/2022   Procedure: ESOPHAGOGASTRODUODENOSCOPY (EGD) WITH PROPOFOL;  Surgeon: Eloise Harman, DO;  [facility];  Service: Endoscopy;  Laterality: N/A;   ESOPHAGOGASTRODUODENOSCOPY (EGD) WITH PROPOFOL N/A 04/07/2022   Procedure: ESOPHAGOGASTRODUODENOSCOPY (EGD) WITH PROPOFOL;  Surgeon: [name], MD;  [facility];  Service: Endoscopy;  Laterality: N/A;   FLEXIBLE SIGMOIDOSCOPY N/A 01/10/2022   Procedure: FLEXIBLE SIGMOIDOSCOPY;  Surgeon: Eloise Harman, DO;  [facility];  Service: Endoscopy;  Laterality: N/A;   HERNIA REPAIR     right inguinal   HOT HEMOSTASIS  04/07/2022   Procedure: HOT HEMOSTASIS (ARGON PLASMA COAGULATION/BICAP);  Surgeon: [name], MD;  [facility];  Service: Endoscopy;;   MALONEY DILATION N/A 06/03/2014   Procedure: Keturah Shavers;  Surgeon: [name], MD;  [facility];  Service: Endoscopy;  Laterality: N/A;   MALONEY DILATION N/A 05/15/2015   Procedure: [name] DILATION;  Surgeon: [name], MD;  [facility];  Service: Endoscopy;  Laterality: N/A;   MALONEY DILATION N/A 11/04/2016   Procedure: [name] DILATION;  Surgeon: [name], MD;  [facility];  Service: Endoscopy;  Laterality: N/A;   MALONEY DILATION N/A 03/03/2020   Procedure: [name] DILATION;  Surgeon: [name], MD;  [facility];  Service: Endoscopy;  Laterality: N/A;   PORT-A-CATH REMOVAL Left 04/08/2022   Procedure: MINOR REMOVAL PORT-A-CATH;  Surgeon: Rusty Aus, DO;  Location: [facility];  Service: General;  Laterality: Left;   PORTACATH PLACEMENT     SAVORY DILATION N/A 06/03/2014   Procedure: SAVORY DILATION;  Surgeon: [name], MD;  [facility];  Service: Endoscopy;  Laterality: N/A;   SUBMUCOSAL INJECTION  04/07/2022   Procedure: SUBMUCOSAL INJECTION;  Surgeon: [name], MD;  [facility];  Service: Endoscopy;;   SUBMUCOSAL TATTOO INJECTION  04/07/2022   Procedure: SUBMUCOSAL TATTOO INJECTION;  Surgeon: [name], MD;  [facility];  Service: Endoscopy;;   TEE WITHOUT CARDIOVERSION N/A 04/09/2022   Procedure: TRANSESOPHAGEAL ECHOCARDIOGRAM (TEE);  Surgeon: Arnoldo Lenis, MD;  Location: [facility];  Service: Endoscopy;  Laterality: N/A;     SOCIAL HISTORY:  Social History   Socioeconomic History   Marital status: Married    Spouse name: Not on file   Number of children: 3   Years of education: Not on file   Highest education level: Not on file  Occupational History    Employer: [employer]  Tobacco Use   Smoking status: Former    Packs/day: 0.50    Years: 40.00    Total  pack years: 20.00    Types: Cigarettes    Quit date: 12/20/2012    Years since quitting: 9.3   Smokeless tobacco: Never  Vaping Use   Vaping Use: Never used  Substance and Sexual Activity   Alcohol use: No   Drug use: No   Sexual activity: Not Currently  Other Topics Concern   Not on file  Social History Narrative   Not on file   Social Determinants of Health   Financial Resource Strain: Not on file  Food Insecurity: No Food Insecurity (04/03/2022)   Hunger Vital Sign    Worried About Running Out of Food in the Last Year: Never true    Ran Out of Food in the Last Year: Never true  Transportation Needs: No Transportation Needs (04/03/2022)   PRAPARE - Hydrologist (Medical): No    Lack of Transportation (Non-Medical): No  Physical Activity: Not on file  Stress: Not on file  Social Connections: Not on file  Intimate Partner Violence: Not At Risk (04/03/2022)   Humiliation, Afraid, Rape, and Kick questionnaire    Fear of Current or Ex-Partner: No    Emotionally  Abused: No    Physically Abused: No    Sexually Abused: No    FAMILY HISTORY:  Family History  Problem Relation Age of Onset   Hypertension Mother    Arthritis Mother    Pneumonia Mother    Hypertension Father    Heart attack Father    Bone cancer Cousin    Colon cancer Neg Hx     CURRENT MEDICATIONS:   Current Outpatient Medications:    apixaban (ELIQUIS) 5 MG TABS tablet, Take 1 tablet (5 mg total) by mouth 2 (two) times daily. Restart 01/11/22, Disp: 60 tablet, Rfl: 6   atorvastatin (LIPITOR) 20 MG tablet, Take 1 tablet (20 mg total) by mouth daily., Disp: , Rfl:    Bacillus Coagulans-Inulin (PROBIOTIC) 1-250 BILLION-MG CAPS, Take 1 capsule by mouth daily., Disp: , Rfl:    bacitracin ointment, Apply topically daily., Disp: 120 g, Rfl: 0   chlorproMAZINE (THORAZINE) 25 MG tablet, Take 1 tablet (25 mg total) by mouth every 6 (six) hours as needed. (Patient taking differently: Take 25 mg by mouth every 6 (six) hours as needed for nausea or vomiting.), Disp: 30 tablet, Rfl: 0   Cholecalciferol (VITAMIN D) 2000 units CAPS, Take 2,000 Units by mouth daily., Disp: , Rfl:    CREON 36000-114000 units CPEP capsule, Take 72,000-108,000 Units by mouth See admin instructions. Take 3 capsules (108,000 units) by mouth with meals and take 2 capsules (72,000 units) with snacks., Disp: , Rfl:    cyanocobalamin 100 MCG tablet, Take by mouth., Disp: , Rfl:    DAPTOmycin (CUBICIN) 500 MG injection, , Disp: , Rfl:    diclofenac Sodium (VOLTAREN) 1 % GEL, Apply 4 g topically 4 (four) times daily., Disp: , Rfl:    dicyclomine (BENTYL) 10 MG capsule, Take 10 mg by mouth 2 (two) times daily before a meal., Disp: , Rfl:    diphenoxylate-atropine (LOMOTIL) 2.5-0.025 MG tablet, Take 1 tablet by mouth 4 times daily, Disp: 120 tablet, Rfl: 1   escitalopram (LEXAPRO) 10 MG tablet, TAKE 1 TABLET BY MOUTH EVERY DAY, Disp: 30 tablet, Rfl: 1   escitalopram (LEXAPRO) 20 MG tablet, Take 1 tablet (20 mg total) by mouth  daily., Disp: 90 tablet, Rfl: 0   feeding supplement (ENSURE ENLIVE / ENSURE PLUS) LIQD, Take 237 mLs by mouth  2 (two) times daily between meals., Disp: , Rfl:    ferrous sulfate 325 (65 FE) MG EC tablet, Take by mouth., Disp: , Rfl:    gabapentin (NEURONTIN) 100 MG capsule, TAKE 1 CAPSULE BY MOUTH EVERY DAY AT BEDTIME (Patient taking differently: Take 100 mg by mouth at bedtime.), Disp: 30 capsule, Rfl: 6   HYDROcodone-acetaminophen (NORCO/VICODIN) 5-325 MG tablet, Take 1 tablet by mouth every 8 (eight) hours as needed for moderate pain., Disp: 45 tablet, Rfl: 0   hydrocortisone cream 1 %, Apply 1 Application topically 2 (two) times daily., Disp: , Rfl:    magnesium oxide (MAG-OX) 400 (240 Mg) MG tablet, TAKE 1 TABLET BY MOUTH TWICE A DAY, Disp: 180 tablet, Rfl: 3   megestrol (MEGACE) 40 MG/ML suspension, TAKE 10 MLS (400 MG TOTAL) BY MOUTH 2 (TWO) TIMES DAILY., Disp: 480 mL, Rfl: 1   mirtazapine (REMERON) 30 MG tablet, TAKE 1 TABLET BY MOUTH AT BEDTIME, Disp: 30 tablet, Rfl: 3   Multiple Vitamin (INFUVITE ADULT IV), 5 mLs See admin instructions. Inject 5 mls from each vial #1 and #2, Disp: , Rfl:    Multiple Vitamin (MULTIVITAMIN WITH MINERALS) TABS tablet, Take 1 tablet by mouth daily., Disp: , Rfl:    pantoprazole (PROTONIX) 40 MG tablet, Take 1 tablet (40 mg total) by mouth 2 (two) times daily before a meal., Disp: 180 tablet, Rfl: 3   PREVALITE 4 g packet, Take 1 packet (4 g total) by mouth daily. DO NOT TAKE WITHIN 2 HOURS OF OTHER MEDICATIONS, Disp: 90 packet, Rfl: 3   sterile water SOLN with Travasol 10 % SOLN 50 g/L, dextrose 70 % SOLN 20 %, sodium INJ 35 mEq/L, potassium INJ 30 mEq/L, calcium GLUCONATE INJ 4.7 mEq/L, magnesium SULFATE INJ 5 mEq/L, phosphorus INJ 15 mmol/L, M.V.I. Adult INJ 10 mL, trace elements Cr-Cu-Mn-Se-Zn (MTE-5 Concentrate) 12-998-500-60 MCG/ML SOLN 1 mL, fat emulsion 30 % EMUL 100 mL/L, continuous., Disp: , Rfl:    sucralfate (CARAFATE) 1 GM/10ML suspension, TAKE 10  MLS (1 G TOTAL) BY MOUTH 4 (FOUR) TIMES DAILY AS NEEDED. FOR BREAKTHROUGH HEARTBURN (Patient taking differently: Take 1 g by mouth 4 (four) times daily as needed (breakthrough heartburn).), Disp: 420 mL, Rfl: 5   traMADol (ULTRAM) 50 MG tablet, Take 1 tablet by mouth every 6 (six) hours as needed for moderate pain or severe pain., Disp: , Rfl:    ondansetron (ZOFRAN-ODT) 4 MG disintegrating tablet, TAKE 1 TABLET BY MOUTH EVERY 4 HOURS AS NEEDED FOR NAUSEA AND VOMITING (Patient not taking: Reported on 04/28/2022), Disp: 30 tablet, Rfl: 1   prochlorperazine (COMPAZINE) 10 MG tablet, Take 10 mg by mouth every 6 (six) hours as needed for vomiting or nausea. (Patient not taking: Reported on 04/28/2022), Disp: , Rfl:  No current facility-administered medications for this visit.  Facility-Administered Medications Ordered in Other Visits:    acetaminophen (TYLENOL) 325 MG tablet, , , ,    heparin lock flush 100 unit/mL, 500 Units, Intravenous, Once, Kefalas, Thomas S, PA-C   loratadine (CLARITIN) 10 MG tablet, , , ,    sodium chloride flush (NS) 0.9 % injection 10 mL, 10 mL, Intravenous, PRN, Holley Bouche, NP, 10 mL at 11/05/16 1136    ALLERGIES:  No Known Allergies   PHYSICAL EXAM:  ECOG Performance status: 1  Vitals:   04/28/22 1054  BP: (!) 155/84  Pulse: 98  Resp: 19  Temp: 98 F (36.7 C)  SpO2: 99%    Filed Weights   04/28/22  1054  Weight: 59.5 kg (131 lb 3.2 oz)    Physical Exam Vitals and nursing note reviewed. Exam conducted with a chaperone present.  Constitutional:      Appearance: Normal appearance.  Cardiovascular:     Rate and Rhythm: Normal rate and regular rhythm.     Pulses: Normal pulses.     Heart sounds: Normal heart sounds.  Pulmonary:     Effort: Pulmonary effort is normal.     Breath sounds: Normal breath sounds.  Abdominal:     Palpations: Abdomen is soft. There is no hepatomegaly, splenomegaly or mass.     Tenderness: There is no abdominal  tenderness.  Lymphadenopathy:     Cervical: No cervical adenopathy.     Right cervical: No superficial cervical adenopathy.    Left cervical: No superficial cervical adenopathy.  Neurological:     General: No focal deficit present.     Mental Status: He is alert and oriented to person, place, and time.  Psychiatric:        Mood and Affect: Mood normal.        Behavior: Behavior normal.      LABORATORY DATA:  I have reviewed the labs as listed.  CBC    Component Value Date/Time   WBC 9.1 04/19/2022 1358   RBC 3.14 (L) 04/19/2022 1358   HGB 9.8 (L) 04/19/2022 1358   HCT 31.1 (L) 04/19/2022 1358   PLT 223 04/19/2022 1358   MCV 99.0 04/19/2022 1358   MCH 31.2 04/19/2022 1358   MCHC 31.5 04/19/2022 1358   RDW 16.7 (H) 04/19/2022 1358   LYMPHSABS 1.8 04/19/2022 1358   MONOABS 0.7 04/19/2022 1358   EOSABS 0.1 04/19/2022 1358   BASOSABS 0.0 04/19/2022 1358      Latest Ref Rng & Units 04/19/2022    1:58 PM 04/12/2022    4:43 AM 04/11/2022    4:54 AM  CMP  Glucose 70 - 99 mg/dL 120  118  92   BUN 8 - 23 mg/dL 21  11  13    Creatinine 0.61 - 1.24 mg/dL 1.20  1.16  1.14   Sodium 135 - 145 mmol/L 133  137  138   Potassium 3.5 - 5.1 mmol/L 5.1  4.4  4.3   Chloride 98 - 111 mmol/L 108  115  114   CO2 22 - 32 mmol/L 18  20  20    Calcium 8.9 - 10.3 mg/dL 7.7  7.6  7.5   Total Protein 6.5 - 8.1 g/dL 6.8  5.2  5.0   Total Bilirubin 0.3 - 1.2 mg/dL 0.2  0.3  0.3   Alkaline Phos 38 - 126 U/L 100  80  74   AST 15 - 41 U/L 39  20  22   ALT 0 - 44 U/L 40  30  32     DIAGNOSTIC IMAGING:  I have independently reviewed the scans and discussed with the patient. NM PET Image Restag (PS) Skull Base To Thigh  Result Date: 04/23/2022 CLINICAL DATA:  Subsequent treatment strategy for pancreatic cancer. EXAM: NUCLEAR MEDICINE PET SKULL BASE TO THIGH TECHNIQUE: 6.33 mCi F-18 FDG was injected intravenously. Full-ring PET imaging was performed from the skull base to thigh after the radiotracer. CT  data was obtained and used for attenuation correction and anatomic localization. Fasting blood glucose: 119 mg/dl COMPARISON:  PET-CT 01/14/2022 and 10/01/2021. Abdominal MRI 01/06/2022. FINDINGS: Mediastinal blood pool activity: SUV max 1.3 NECK: No hypermetabolic cervical lymph nodes are identified. No  suspicious activity identified within the pharyngeal mucosal space. Incidental CT findings: none CHEST: There is recurrent hypermetabolic activity in the left hilum associated with partially calcified lymph nodes. On the PET-CT of 33/29/5188, metabolic activity in this area pedis max of 4.9. This had resolved on the interval PET-CT from 01/14/2022. Currently, there is an SUV max of 3.4. CT appearance is grossly stable. No other hypermetabolic mediastinal or hilar lymph nodes are identified. No hypermetabolic pulmonary activity or suspicious nodularity. Incidental CT findings: Aortic and branch vessel atherosclerosis. A right arm PICC projects to the lower SVC level. Apparent previous left upper lobe resection with stable volume loss in the left hemithorax and perihilar scarring. There are new scattered ground-glass opacities in the right lower lobe, most notable on CT images 41 and 43 of series 7. In addition, there is a new part solid subpleural nodule in the right middle lobe which measures approximately 6 mm on image 66/7. No hypermetabolic activity is seen within the findings. ABDOMEN/PELVIS: No residual hypermetabolic activity is seen within the pancreatic tail lesion (SUV max 1.5). There is low-level metabolic activity within the left adrenal gland (SUV max 2.3) without focal mass lesion. No hypermetabolic activity within the liver, right adrenal gland or spleen. There is no hypermetabolic nodal activity in the abdomen or pelvis. Incidental CT findings: Nonobstructing calculus in the lower pole of the left kidney. Unchanged large cyst in the anterior interpolar region of the right kidney for which no follow-up  imaging is recommended. Postsurgical changes in the stomach, small bowel and distal colon. Aortic and branch vessel atherosclerosis. SKELETON: There are multiple new foci of hypermetabolic activity in the bones consistent with metastatic disease. Representative lesions include a focus in the proximal right humeral shaft (SUV max 2.2), a T11 lesion (SUV max 3.1) a left iliac lesion (SUV max 4.6), a right iliac lesion (SUV max 3.6) and a proximal left femoral diaphyseal lesion (SUV max 3.1). Incidental CT findings: No evidence of pathologic fracture. Mild spondylosis. IMPRESSION: 1. New multifocal hypermetabolic osseous lesions consistent with metastatic disease. 2. No definite signs of extra osseous metastatic disease. There is no residual or recurrent abnormal activity within the pancreatic tail. 3. Recurrent hypermetabolic activity in the left hilum associated with partially calcified lymph nodes. This is nonspecific, and could be reactive. It seems the patient has a history of lung cancer for which previous upper lobe resection has been performed, and left hilar recurrence/metastatic disease is not completely excluded. 4. New ground-glass and part solid pulmonary opacities in the right lung without hypermetabolic activity, likely inflammatory. 5.  Aortic Atherosclerosis (ICD10-I70.0). Electronically Signed   By: Richardean Sale M.D.   On: 04/23/2022 09:41   [place] EKG SITE RITE  Result Date: 04/10/2022 If Site Rite image not attached, placement could not be confirmed due to current cardiac rhythm.  ECHO TEE  Result Date: 04/09/2022    TRANSESOPHOGEAL ECHO REPORT   Patient Name:   WOLFGANG FINIGAN Date of Exam: 04/09/2022 Medical Rec #:  [id number]        Height:       68.0 in Accession #:    [id number]       Weight:       119.9 lb Date of Birth:  Sep 06, 1950       BSA:          1.644 m Patient Age:    72 years         BP:  130/80 mmHg Patient Gender: M                HR:           78 bpm. Exam Location:   [name] Procedure: Transesophageal Echo, Color Doppler and Cardiac Doppler Indications:    Bacteremia  History:        Patient has prior history of Echocardiogram examinations, most                 recent 04/04/2022. Signs/Symptoms:Chest Pain; Risk                 Factors:Hypertension and Dyslipidemia. CKD, Colon CA.  Sonographer:    Wenda Low Referring Phys: [id number] Alphonse Guild BRANCH PROCEDURE: The transesophogeal probe was passed without difficulty through the esophogus of the patient. Sedation performed by different physician. The patient developed no complications during the procedure.  IMPRESSIONS  1. Left ventricular ejection fraction, by estimation, is 60 to 65%. The left ventricle has normal function.  2. Right ventricular systolic function is normal. The right ventricular size is normal.  3. No left atrial/left atrial appendage thrombus was detected. The LAA emptying velocity was 97 cm/s.  4. The mitral valve is normal in structure. No evidence of mitral valve regurgitation. No evidence of mitral stenosis.  5. The aortic valve is tricuspid. Aortic valve regurgitation is not visualized. No aortic stenosis is present. Conclusion(s)/Recommendation(s): No evidence of vegetation/infective endocarditis on this transesophageael echocardiogram. FINDINGS  Left Ventricle: Left ventricular ejection fraction, by estimation, is 60 to 65%. The left ventricle has normal function. The left ventricular internal cavity size was normal in size. Right Ventricle: The right ventricular size is normal. Right vetricular wall thickness was not well visualized. Right ventricular systolic function is normal. Left Atrium: Left atrial size was normal in size. No left atrial/left atrial appendage thrombus was detected. The LAA emptying velocity was 97 cm/s. Right Atrium: Right atrial size was normal in size. Pericardium: There is no evidence of pericardial effusion. Mitral Valve: The mitral valve is normal in structure. No  evidence of mitral valve regurgitation. No evidence of mitral valve stenosis. Tricuspid Valve: The tricuspid valve is normal in structure. Tricuspid valve regurgitation is mild . No evidence of tricuspid stenosis. Aortic Valve: The aortic valve is tricuspid. Aortic valve regurgitation is not visualized. No aortic stenosis is present. Pulmonic Valve: The pulmonic valve was not well visualized. Pulmonic valve regurgitation is not visualized. No evidence of pulmonic stenosis. Aorta: The aortic root is normal in size and structure. IAS/Shunts: No atrial level shunt detected by color flow Doppler.   AORTA Ao Root diam: 3.50 cm Carlyle Dolly MD Electronically signed by Carlyle Dolly MD Signature Date/Time: 04/09/2022/2:49:02 PM    Final    DG Chest Port 1 View  Result Date: 04/08/2022 CLINICAL DATA:  Port removal EXAM: PORTABLE CHEST 1 VIEW COMPARISON:  None Available. FINDINGS: Stable scarring or consolidation in the left hemithorax with decreased volumes. Right lung clear. No pneumothorax. No definite pleural effusion. Aorta is ectatic. Postop changes epigastric region and right upper quadrant. Left-sided Port-A-Cath has IMPRESSION: Left hemithorax scarring or consolidation and hypoventilation. Otherwise no acute cardiopulmonary process. Electronically Signed   By: Sammie Bench M.D.   On: 04/08/2022 10:52   ECHOCARDIOGRAM COMPLETE  Result Date: 04/04/2022    ECHOCARDIOGRAM REPORT   Patient Name:   ORBIE GRUPE Date of Exam: 04/04/2022 Medical Rec #:  [id number]        Height:  68.0 in Accession #:    [id number]       Weight:       120.0 lb Date of Birth:  [birth year]       BSA:          1.645 m Patient Age:    58 years         BP:           95/46 mmHg Patient Gender: M                HR:           91 bpm. Exam Location:  Inpatient Procedure: 2D Echo, Cardiac Doppler and Color Doppler Indications:    Bacteremia  History:        Patient has prior history of Echocardiogram examinations, most                  recent 03/20/2016. Signs/Symptoms:Fever; Risk                 Factors:Hypertension. Cancer. Pulmonary embolus 2012.  Sonographer:    Roseanna Rainbow RDCS Referring Phys: [phone] DAVID TAT IMPRESSIONS  1. Left ventricular ejection fraction, by estimation, is 60 to 65%. Left ventricular ejection fraction by 2D MOD biplane is 60.6 %. The left ventricle has normal function. The left ventricle has no regional wall motion abnormalities. Left ventricular diastolic parameters are consistent with Grade I diastolic dysfunction (impaired relaxation).  2. Right ventricular systolic function is normal. The right ventricular size is normal. There is mildly elevated pulmonary artery systolic pressure. The estimated right ventricular systolic pressure is 74.2 mmHg.  3. The mitral valve is grossly normal. Trivial mitral valve regurgitation.  4. The aortic valve is tricuspid. Aortic valve regurgitation is not visualized.  5. The inferior vena cava is normal in size with greater than 50% respiratory variability, suggesting right atrial pressure of 3 mmHg. Conclusion(s)/Recommendation(s): No evidence of valvular vegetations on this transthoracic echocardiogram. Consider a transesophageal echocardiogram to exclude infective endocarditis if clinically indicated. FINDINGS  Left Ventricle: Left ventricular ejection fraction, by estimation, is 60 to 65%. Left ventricular ejection fraction by 2D MOD biplane is 60.6 %. The left ventricle has normal function. The left ventricle has no regional wall motion abnormalities. The left ventricular internal cavity size was normal in size. There is no left ventricular hypertrophy. Left ventricular diastolic parameters are consistent with Grade I diastolic dysfunction (impaired relaxation). Indeterminate filling pressures. Right Ventricle: The right ventricular size is normal. No increase in right ventricular wall thickness. Right ventricular systolic function is normal. There is mildly elevated pulmonary  artery systolic pressure. The tricuspid regurgitant velocity is 3.17  m/s, and with an assumed right atrial pressure of 3 mmHg, the estimated right ventricular systolic pressure is 59.5 mmHg. Left Atrium: Left atrial size was normal in size. Right Atrium: Right atrial size was normal in size. Pericardium: There is no evidence of pericardial effusion. Mitral Valve: The mitral valve is grossly normal. Trivial mitral valve regurgitation. Tricuspid Valve: The tricuspid valve is grossly normal. Tricuspid valve regurgitation is mild. Aortic Valve: The aortic valve is tricuspid. Aortic valve regurgitation is not visualized. Pulmonic Valve: The pulmonic valve was normal in structure. Pulmonic valve regurgitation is not visualized. Aorta: The aortic root and ascending aorta are structurally normal, with no evidence of dilitation. Venous: The inferior vena cava is normal in size with greater than 50% respiratory variability, suggesting right atrial pressure of 3 mmHg. IAS/Shunts: No atrial level shunt detected by color flow  Doppler.  LEFT VENTRICLE PLAX 2D                        Biplane EF (MOD) LVIDd:         3.80 cm         LV Biplane EF:   Left LVIDs:         2.40 cm                          ventricular LV PW:         1.10 cm                          ejection LV IVS:        0.80 cm                          fraction by LVOT diam:     2.30 cm                          2D MOD LV SV:         90                               biplane is LV SV Index:   55                               60.6 %. LVOT Area:     4.15 cm                                Diastology                                LV e' medial:    8.70 cm/s LV Volumes (MOD)               LV E/e' medial:  13.4 LV vol d, MOD    96.3 ml       LV e' lateral:   11.90 cm/s A2C:                           LV E/e' lateral: 9.8 LV vol d, MOD    90.4 ml A4C: LV vol s, MOD    41.7 ml A2C: LV vol s, MOD    38.0 ml A4C: LV SV MOD A2C:   54.6 ml LV SV MOD A4C:   90.4 ml LV SV MOD BP:     62.1 ml RIGHT VENTRICLE             IVC RV S prime:     16.00 cm/s  IVC diam: 1.80 cm TAPSE (M-mode): 1.8 cm LEFT ATRIUM             Index        RIGHT ATRIUM           Index LA diam:        3.60 cm 2.19 cm/m   RA Area:     13.50 cm LA Vol (A2C):   69.4 ml 42.19 ml/m  RA Volume:  36.00 ml  21.89 ml/m LA Vol (A4C):   27.6 ml 16.78 ml/m LA Biplane Vol: 46.0 ml 27.97 ml/m  AORTIC VALVE LVOT Vmax:   135.00 cm/s LVOT Vmean:  90.700 cm/s LVOT VTI:    0.216 m  AORTA Ao Root diam: 3.10 cm Ao Asc diam:  3.60 cm MITRAL VALVE                TRICUSPID VALVE MV Area (PHT): 4.15 cm     TR Peak grad:   40.2 mmHg MV Decel Time: 183 msec     TR Vmax:        317.00 cm/s MV E velocity: 116.67 cm/s MV A velocity: 96.57 cm/s   SHUNTS MV E/A ratio:  1.21         Systemic VTI:  0.22 m                             Systemic Diam: 2.30 cm Lyman Bishop MD Electronically signed by Lyman Bishop MD Signature Date/Time: 04/04/2022/5:27:58 PM    Final    DG Chest Port 1 View  Result Date: 04/02/2022 CLINICAL DATA:  Possible sepsis EXAM: PORTABLE CHEST 1 VIEW COMPARISON:  01/08/2022 FINDINGS: Cardiac size is within normal limits. There is decreased volume in the left lung. Linear densities are seen in left mid and left lower lung fields. Surgical staples are seen in left parahilar region. Left hemidiaphragm is elevated. There is blunting of left lateral CP angle. There is no pneumothorax. Tip of left subclavian chest port is seen in superior vena cava. IMPRESSION: Postsurgical changes are noted in left hemithorax. There are no new infiltrates or signs of pulmonary edema. Electronically Signed   By: Elmer Picker M.D.   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  No lymphadenopathy. - EUS and FNA (09/21/2021): Oval mass in the pancreatic tail, hypoechoic, 20  mm x 17 mm.  Remainder of the pancreas shows decreased visualization of the pancreas. - Pathology: Invasive adenocarcinoma, ductal type, moderately to poorly differentiated.  Background desmoplastic stroma, blood and pancreatic tissue.  Morphologically distinct from prior lung adenocarcinoma.  TTF-1 and Napsin A were negative. - Patient evaluated by Dr. Dayton Scrape at [facility] for pancreatic resection. - CA 19-9 (10/02/2021): 6.47 - PET CT scan (10/02/2021): Ill-defined 4 cm mass in the left upper quadrant in the region of the pancreatic tail with mild hypermetabolism, SUV 4.7.  Soft tissue nodule in the gastrohepatic ligament measuring 2 x 1.3 cm, SUV 4.0.  Suspicious for metastatic lymphadenopathy.  9 mm mediastinal lymph node and AP window is hypermetabolic SUV 4.9.  Borderline enlarged left hilar lymph nodes show hypermetabolic activity SUV 3.4.  Previously seen subpleural nodule in the superior right lower lobe has nearly completely resolved.  Overall mild hypermetabolic mediastinal and left hilar lymph nodes which are nonspecific.  May be reactive in etiology although metastatic disease cannot definitely be excluded.        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 -Last EGD/colonoscopy on 05/15/2015 was normal. - CT scan in October 2018 was negative for metastatic disease. 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  Margins negative.  2 hilar lymph nodes were negative. - No adjuvant chemotherapy was needed.    Social/family history: - Lives at home with his wife.  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  No chemical exposure.  Quit smoking in 2014 and smoked 1 pack/day for 44 years. - Paternal cousin has bone cancer.    PLAN:   Stage IIB adenocarcinoma of the pancreatic tail: - Last chemotherapy on 12/30/2021. - We reviewed PET scan from 04/22/2022: New foci of hypermetabolic activity in the bones, proximal right humeral shaft, left iliac lesion, right iliac lesion, proximal left femoral diaphysis.  There is also T11 lesion.  Nonspecific hypermetabolic activity in the left hilum. - I have recommended biopsy of the iliac lesion. - He was evaluated by ID who strongly recommended discontinuing PICC line due to high risk of infection.  We will ask IR to place a port on the same day of the biopsy.  We will discontinue PICC line after the port placement. - I have talked to IR and the biopsy will be done on the right iliac lesion. - I will also update Dr. Sander Nephew at [facility].   2.  Normocytic anemia: - Hemoglobin is 9.8.  Ferritin is 536 and percent saturation of 31.  No indication for parenteral iron therapy.   3.  Chronic diarrhea: - Continue Imodium and Lomotil as needed.   4.  Weight loss: - He is continuing TPN via PICC line. - He has gained weight to 131 pounds.  He is also eating by mouth 3-4 times daily, small quantities.     Orders placed this encounter:  Orders Placed This Encounter  Procedures   CT Biopsy   IR IMAGING GUIDED PORT INSERTION      I,Alexis Herring,acting as a scribe for Derek Jack, MD.,have documented all relevant documentation on the behalf of Derek Jack, MD,as directed by  Derek Jack, MD while in the presence of Derek Jack, MD.  I, Derek Jack MD, have reviewed the above documentation for accuracy and completeness, and I agree with the above.   Derek Jack, MD [place] [phone]

## 2022-04-28 NOTE — Progress Notes (Signed)
Michaelle Birks, MD  [name] D; P Ir Procedure Requests PROCEDURE / BIOPSY REVIEW Date: 04/28/22  Requested Biopsy site: Bone Reason for request: Mets. Hx pancreatic CA Imaging review: Best seen on PET CT, 04/22/22 Fused 299/438, correlate on #3, 200/293  Decision: Approved Imaging modality to perform: CT Schedule with: Moderate Sedation Schedule for: Any VIR  Additional comments: @VIR : R iliac has faint radiographic correlate and is easier to target than L iliac foci @Schedulers . CT R Iliac bone Bx. Spoke w Dr Raliegh Ip to inform, states Pt also needs a Port. Schedule on same day, ideally procedures to follow each other. No hold Eliquis.  Please contact me with questions, concerns, or if issue pertaining to this request arise.  Michaelle Birks, MD [facility]  Pager. [place]

## 2022-04-30 ENCOUNTER — Other Ambulatory Visit: Payer: Self-pay

## 2022-05-05 ENCOUNTER — Telehealth (INDEPENDENT_AMBULATORY_CARE_PROVIDER_SITE_OTHER): Payer: Medicare Other | Admitting: Internal Medicine

## 2022-05-05 ENCOUNTER — Other Ambulatory Visit: Payer: Self-pay

## 2022-05-05 DIAGNOSIS — R7881 Bacteremia: Secondary | ICD-10-CM

## 2022-05-05 DIAGNOSIS — B957 Other staphylococcus as the cause of diseases classified elsewhere: Secondary | ICD-10-CM

## 2022-05-05 NOTE — Addendum Note (Signed)
Addended byLaurice Record on: 05/05/2022 03:01 PM   Modules accepted: Level of Service

## 2022-05-05 NOTE — Progress Notes (Signed)
    Spoke with pt, he still has PICC line in. Planned for port placement on Monday. Will have our clinic call pt on 2/20 to ensure PICC is out. No other issues

## 2022-05-06 ENCOUNTER — Other Ambulatory Visit: Payer: Self-pay | Admitting: Radiology

## 2022-05-06 DIAGNOSIS — M899 Disorder of bone, unspecified: Secondary | ICD-10-CM

## 2022-05-07 ENCOUNTER — Other Ambulatory Visit: Payer: Self-pay | Admitting: Hematology

## 2022-05-07 ENCOUNTER — Other Ambulatory Visit (HOSPITAL_COMMUNITY): Payer: Self-pay | Admitting: Physician Assistant

## 2022-05-08 NOTE — H&P (Signed)
Chief Complaint: Patient was seen in consultation today for pancreatic cancer; port-a-catheter placement and bone lesion biopsy.   Referring Physician(s): Conservation officer, historic buildings Physician: {Supervising Physician:[id number]}  Patient Status: {IR Consult Patient Status:[id number]}  History of Present Illness: [name] is a 72 y.o. male with a medical history significant for colon cancer (2012 s/p resection and chemotherapy), lung cancer (2018), ETOH abuse, HTN, TIA, pulmonary embolism ([place]) and pancreatic cancer diagnosed July 2023. He has been receiving chemotherapy since that time. His port-a-catheter (not placed by IR) was removed 04/08/22 by Surgery team due to bacteremia. He has a PICC for TPN and that is now also being used for chemotherapy. The Infectious Disease team has recommended removal of PICC due to infection risk. He is no longer on IV antibiotics and the patient will required durable venous access for continued systemic therapies.   Recent imaging shows possible disease progression.  NM PET 04/22/22 SKELETON: There are multiple new foci of hypermetabolic activity in the bones consistent with metastatic disease. Representative lesions include a focus in the proximal right humeral shaft (SUV max 2.2), a T11 lesion (SUV max 3.1) a left iliac lesion (SUV max 4.6), a right iliac lesion (SUV max 3.6) and a proximal left femoral diaphyseal lesion (SUV max 3.1). IMPRESSION: 1. New multifocal hypermetabolic osseous lesions consistent with metastatic disease. 2. No definite signs of extra osseous metastatic disease. There is no residual or recurrent abnormal activity within the pancreatic tail. 3. Recurrent hypermetabolic activity in the left hilum associated with partially calcified lymph nodes. This is nonspecific, and could be reactive. It seems the patient has a history of lung cancer for which previous upper lobe resection has been performed, and left hilar  recurrence/metastatic disease is not completely excluded. 4. New ground-glass and part solid pulmonary opacities in the right lung without hypermetabolic activity, likely inflammatory. 5.  Aortic Atherosclerosis (ICD10-I70.0).  Interventional Radiology has been asked to evaluate this patient for an image-guided port-a-catheter placement and image-guided right iliac bone lesion biopsy. Imaging reviewed and procedure approved by Dr. Maryelizabeth Kaufmann.   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   Stage 3   Anemia    Anxiety    Arthritis    Barrett's esophagus    Blood transfusion    Bowel obstruction ([place]) 05/13/2012   Recurrent   Bronchitis    Chest pain at rest    Chronic abdominal pain    Erosive esophagitis    ETOH abuse    quit 03/2010   GERD (gastroesophageal reflux disease)    Hx of Clostridium difficile infection 01/2012   Hypertension    Ileus ([place])    Iron deficiency anemia 03/23/2016   Lung cancer ([place])    Obstruction of bowel ([place]) 03/03/2014   Osteoporosis    Pancreatic cancer (HCC)    Personal history of PE (pulmonary embolism) 10/01/2010   Pneumonia    Pulmonary embolism ([place]) 02/2010   Recurrent upper respiratory infection (URI)    Renal disorder    S/P endoscopy 09/28/2010   erosive reflux esophagitis, Billroth I anatomy   S/P partial gastrectomy 1980s   Seizures ([place])    was on meds for 6 months, Unknown etiology, last seizure was 2017.   Shortness of breath    TIA (transient ischemic attack) 12/2009   Vitamin B12 deficiency     Past Surgical History:  Procedure Laterality Date   ABDOMINAL ADHESION SURGERY  03/04/15   @ [facility]  ABDOMINAL EXPLORATION SURGERY     abdominal sugery     for bowel obstruction x 8, all in 1980s, except for one in 07/2010   APPENDECTOMY  1980s   Billroth 1 hemigastrectomy  1980s   per patient for benign duodenal tumor   BIOPSY  01/10/2022   Procedure: BIOPSY;  Surgeon: Eloise Harman, DO;  [facility];  Service: Endoscopy;;  gastric,ileum   CARDIAC CATHETERIZATION  07/17/2012   CHOLECYSTECTOMY  1980s   COLON SURGERY  May 2012   left hemicolectomy, colon cancer found at time of surgery for bowel obstruction   COLON SURGERY  10/12/2018   At [facility]: Resection of right colon and terminal ileum with creation of ileorectal anastomosis for large bowel obstruction.  Cecum and ascending colon noted to be attached to the rectum.   COLONOSCOPY  03/18/2011   anastomosis at 35cm. Several adenomatous polyps removed. Sigmoid diverticulosis. Next TCS 02/2013   COLONOSCOPY N/A 07/24/2012   PPJ:KDTOIZ post segmental resection with normal-appearing colonic anastomosis aside from an adjacent polyp-removed as described above. Rectal polyp-removed as described above. CT findings appear to have been artifactual. tubular adenomas/prolapsed type polyp.   COLONOSCOPY N/A 05/15/2015   Procedure: COLONOSCOPY;  Surgeon: [name], MD;  [facility];  Service: Endoscopy;  Laterality: N/A;   COLONOSCOPY  09/26/2018   at [facility]: prep not adequate for colon cancer surveillance.  Prior end-to-end colonic anastomosis in the rectosigmoid region.  This was patent and characterized by healthy-appearing mucosa.  Anastomosis was traversed.  Normal terminal ileum.   COLONOSCOPY WITH PROPOFOL N/A 11/04/2016   Dr. [name], status post subtotal colectomy with normal-appearing residual lower GI tract.  Next colonoscopy in 5 years.   ENTEROSCOPY N/A 04/07/2022   Procedure: ENTEROSCOPY;  Surgeon: [name], MD;  [facility];  Service: Endoscopy;  Laterality: N/A;   ESOPHAGOGASTRODUODENOSCOPY  09/28/2010   ESOPHAGOGASTRODUODENOSCOPY  12/01/2010   Cervical web status post dilation, erosive esophagitis, B1 hemigastrectomy, inflamed anastomosis   ESOPHAGOGASTRODUODENOSCOPY  04/16/2011   excoriation at [facility] c/w trauma/M-W tear, friable gastric anastomosis, dilation efferent limb    ESOPHAGOGASTRODUODENOSCOPY N/A 06/03/2014   Dr.[name]- cervcal esopphageal web s/p dilation. abnormal distal esophagus bx= barretts esophagus   ESOPHAGOGASTRODUODENOSCOPY N/A 05/15/2015   Procedure: ESOPHAGOGASTRODUODENOSCOPY (EGD);  Surgeon: [name], MD;  [facility];  Service: Endoscopy;  Laterality: N/A;  230   ESOPHAGOGASTRODUODENOSCOPY (EGD) WITH ESOPHAGEAL DILATION  02/25/2012   TIW:[id number] esophageal web-s/p dilation anddisruption as described above. Status post prior gastric with Billroth I configuration. Abnormal gastric mucosa at the anastomosis. Gastric biopsy showed mild chronic inflammation but no H. pylori    ESOPHAGOGASTRODUODENOSCOPY (EGD) WITH PROPOFOL N/A 11/04/2016   Dr. [name]: Reflux esophagitis, small hiatal hernia status post hemigastrectomy.  Single patent efferent small bowel limb appeared normal, 2 x 2 centimeter tongue of salmon epithelium again seen, esophageal dilation.  Biopsies consistent with reflux changes, not Barrett's however this was confirmed on prior EGDs.  Offer 3-year follow-up EGD August 2021.   ESOPHAGOGASTRODUODENOSCOPY (EGD) WITH PROPOFOL N/A 03/03/2020   Procedure: ESOPHAGOGASTRODUODENOSCOPY (EGD) WITH PROPOFOL;  Surgeon: [name], MD;  [facility];  Service: Endoscopy;  Laterality: N/A;  3:00pm   ESOPHAGOGASTRODUODENOSCOPY (EGD) WITH PROPOFOL N/A 01/10/2022   Procedure: ESOPHAGOGASTRODUODENOSCOPY (EGD) WITH PROPOFOL;  Surgeon: Eloise Harman, DO;  [facility];  Service: Endoscopy;  Laterality: N/A;   ESOPHAGOGASTRODUODENOSCOPY (EGD) WITH PROPOFOL N/A 04/07/2022   Procedure: ESOPHAGOGASTRODUODENOSCOPY (EGD) WITH PROPOFOL;  Surgeon: [name], MD;  [facility];  Service: Endoscopy;  Laterality: N/A;   FLEXIBLE SIGMOIDOSCOPY N/A 01/10/2022   Procedure: FLEXIBLE SIGMOIDOSCOPY;  Surgeon: Eloise Harman, DO;  [facility];  Service: Endoscopy;  Laterality: N/A;   HERNIA REPAIR      right inguinal   HOT HEMOSTASIS  04/07/2022   Procedure: HOT HEMOSTASIS (ARGON PLASMA COAGULATION/BICAP);  Surgeon: [name], MD;  [facility];  Service: Endoscopy;;   MALONEY DILATION N/A 06/03/2014   Procedure: Keturah Shavers;  Surgeon: [name], MD;  [facility];  Service: Endoscopy;  Laterality: N/A;   MALONEY DILATION N/A 05/15/2015   Procedure: [name] DILATION;  Surgeon: [name], MD;  [facility];  Service: Endoscopy;  Laterality: N/A;   MALONEY DILATION N/A 11/04/2016   Procedure: [name] DILATION;  Surgeon: [name], MD;  [facility];  Service: Endoscopy;  Laterality: N/A;   MALONEY DILATION N/A 03/03/2020   Procedure: [name] DILATION;  Surgeon: [name], MD;  [facility];  Service: Endoscopy;  Laterality: N/A;   PORT-A-CATH REMOVAL Left 04/08/2022   Procedure: MINOR REMOVAL PORT-A-CATH;  Surgeon: Rusty Aus, DO;  Location: [facility];  Service: General;  Laterality: Left;   PORTACATH PLACEMENT     SAVORY DILATION N/A 06/03/2014   Procedure: SAVORY DILATION;  Surgeon: [name], MD;  [facility];  Service: Endoscopy;  Laterality: N/A;   SUBMUCOSAL INJECTION  04/07/2022   Procedure: SUBMUCOSAL INJECTION;  Surgeon: [name], MD;  [facility];  Service: Endoscopy;;   SUBMUCOSAL TATTOO INJECTION  04/07/2022   Procedure: SUBMUCOSAL TATTOO INJECTION;  Surgeon: [name], MD;  [facility];  Service: Endoscopy;;   TEE WITHOUT CARDIOVERSION N/A 04/09/2022   Procedure: TRANSESOPHAGEAL ECHOCARDIOGRAM (TEE);  Surgeon: Arnoldo Lenis, MD;  Location: [facility];  Service: Endoscopy;  Laterality: N/A;    Allergies: Patient has no known allergies.  Medications: Prior to Admission medications   Medication Sig Start Date End Date Taking? Authorizing Provider  apixaban (ELIQUIS) 5 MG TABS tablet Take 1 tablet (5 mg total) by mouth 2 (two) times  daily. Restart 01/11/22 01/10/22   Orson Eva, MD  atorvastatin (LIPITOR) 20 MG tablet Take 1 tablet (20 mg total) by mouth daily. 04/23/22   Barton Dubois, MD  Bacillus Coagulans-Inulin (PROBIOTIC) 1-250 BILLION-MG CAPS Take 1 capsule by mouth daily.    [provider]  bacitracin ointment Apply topically daily. 04/13/22   Barton Dubois, MD  chlorproMAZINE (THORAZINE) 25 MG tablet Take 1 tablet (25 mg total) by mouth every 6 (six) hours as needed. Patient taking differently: Take 25 mg by mouth every 6 (six) hours as needed for nausea or vomiting. 01/01/22   Derek Jack, MD  Cholecalciferol (VITAMIN D) 2000 units CAPS Take 2,000 Units by mouth daily.    [provider]  CREON 36000-114000 units CPEP capsule Take 72,000-108,000 Units by mouth See admin instructions. Take 3 capsules (108,000 units) by mouth with meals and take 2 capsules (72,000 units) with snacks. 11/18/21   [provider]  cyanocobalamin 100 MCG tablet Take by mouth.    [provider]  DAPTOmycin (CUBICIN) 500 MG injection     [provider]  diclofenac Sodium (VOLTAREN) 1 % GEL Apply 4 g topically 4 (four) times daily. 01/28/22   [provider]  dicyclomine (BENTYL) 10 MG capsule Take 10  mg by mouth 2 (two) times daily before a meal. 01/18/22 01/18/23  [provider]  diphenoxylate-atropine (LOMOTIL) 2.5-0.025 MG tablet Take 1 tablet by mouth 4 times daily 02/04/22   Mahala Menghini, PA-C  escitalopram (LEXAPRO) 10 MG tablet TAKE 1 TABLET BY MOUTH EVERY DAY 04/27/22   Derek Jack, MD  escitalopram (LEXAPRO) 20 MG tablet Take 1 tablet (20 mg total) by mouth daily. 03/08/22   Derek Jack, MD  feeding supplement (ENSURE ENLIVE / ENSURE PLUS) LIQD Take 237 mLs by mouth 2 (two) times daily between meals. 04/13/22   Barton Dubois, MD  ferrous sulfate 325 (65 FE) MG EC tablet Take by mouth.    [provider]  gabapentin (NEURONTIN) 100 MG  capsule Take 1 capsule by mouth at bedtime 05/07/22   Derek Jack, MD  HYDROcodone-acetaminophen (NORCO/VICODIN) 5-325 MG tablet Take 1 tablet by mouth every 8 (eight) hours as needed for moderate pain. 04/23/22   Harriett Rush, PA-C  hydrocortisone cream 1 % Apply 1 Application topically 2 (two) times daily.    [provider]  magnesium oxide (MAG-OX) 400 (240 Mg) MG tablet TAKE 1 TABLET BY MOUTH TWICE A DAY 01/22/22   Derek Jack, MD  megestrol (MEGACE) 40 MG/ML suspension TAKE 10 MLS (400 MG TOTAL) BY MOUTH 2 (TWO) TIMES DAILY. 04/12/22   Derek Jack, MD  mirtazapine (REMERON) 30 MG tablet TAKE 1 TABLET BY MOUTH AT BEDTIME 02/16/22   Derek Jack, MD  Multiple Vitamin (INFUVITE ADULT IV) 5 mLs See admin instructions. Inject 5 mls from each vial #1 and #2    [provider]  Multiple Vitamin (MULTIVITAMIN WITH MINERALS) TABS tablet Take 1 tablet by mouth daily.    [provider]  ondansetron (ZOFRAN-ODT) 4 MG disintegrating tablet TAKE 1 TABLET BY MOUTH EVERY 4 HOURS AS NEEDED FOR NAUSEA AND VOMITING Patient not taking: Reported on 04/28/2022 04/02/22   Noemi Chapel, MD  pantoprazole (PROTONIX) 40 MG tablet Take 1 tablet (40 mg total) by mouth 2 (two) times daily before a meal. 06/15/21   Mahala Menghini, PA-C  PREVALITE 4 g packet Take 1 packet (4 g total) by mouth daily. DO NOT TAKE WITHIN 2 HOURS OF OTHER MEDICATIONS 08/04/21   Mahala Menghini, PA-C  prochlorperazine (COMPAZINE) 10 MG tablet Take 10 mg by mouth every 6 (six) hours as needed for vomiting or nausea. Patient not taking: Reported on 04/28/2022    [provider]  sterile water SOLN with Travasol 10 % SOLN 50 g/L, dextrose 70 % SOLN 20 %, sodium INJ 35 mEq/L, potassium INJ 30 mEq/L, calcium GLUCONATE INJ 4.7 mEq/L, magnesium SULFATE INJ 5 mEq/L, phosphorus INJ 15 mmol/L, M.V.I. Adult INJ 10 mL, trace elements Cr-Cu-Mn-Se-Zn (MTE-5 Concentrate) 12-998-500-60 MCG/ML  SOLN 1 mL, fat emulsion 30 % EMUL 100 mL/L continuous.    [provider]  sucralfate (CARAFATE) 1 GM/10ML suspension TAKE 10 MLS (1 G TOTAL) BY MOUTH 4 (FOUR) TIMES DAILY AS NEEDED. Froid HEARTBURN Patient taking differently: Take 1 g by mouth 4 (four) times daily as needed (breakthrough heartburn). 10/22/20   Mahala Menghini, PA-C  traMADol (ULTRAM) 50 MG tablet Take 1 tablet by mouth every 6 (six) hours as needed for moderate pain or severe pain. 09/07/21   [provider]     Family History  Problem Relation Age of Onset   Hypertension Mother    Arthritis Mother    Pneumonia Mother    Hypertension Father  Heart attack Father    Bone cancer Cousin    Colon cancer Neg Hx     Social History   Socioeconomic History   Marital status: Married    Spouse name: Not on file   Number of children: 3   Years of education: Not on file   Highest education level: Not on file  Occupational History    Employer: [employer]  Tobacco Use   Smoking status: Former    Packs/day: 0.50    Years: 40.00    Total pack years: 20.00    Types: Cigarettes    Quit date: 12/20/2012    Years since quitting: 9.3   Smokeless tobacco: Never  Vaping Use   Vaping Use: Never used  Substance and Sexual Activity   Alcohol use: No   Drug use: No   Sexual activity: Not Currently  Other Topics Concern   Not on file  Social History Narrative   Not on file   Social Determinants of Health   Financial Resource Strain: Not on file  Food Insecurity: No Food Insecurity (04/03/2022)   Hunger Vital Sign    Worried About Running Out of Food in the Last Year: Never true    Ran Out of Food in the Last Year: Never true  Transportation Needs: No Transportation Needs (04/03/2022)   PRAPARE - Hydrologist (Medical): No    Lack of Transportation (Non-Medical): No  Physical Activity: Not on file  Stress: Not on file  Social Connections: Not on file    Review  of Systems: A 12 point ROS discussed and pertinent positives are indicated in the HPI above.  All other systems are negative.  Review of Systems  Vital Signs: There were no vitals taken for this visit.  Physical Exam  Imaging: NM PET Image Restag (PS) Skull Base To Thigh  Result Date: 04/23/2022 CLINICAL DATA:  Subsequent treatment strategy for pancreatic cancer. EXAM: NUCLEAR MEDICINE PET SKULL BASE TO THIGH TECHNIQUE: 6.33 mCi F-18 FDG was injected intravenously. Full-ring PET imaging was performed from the skull base to thigh after the radiotracer. CT data was obtained and used for attenuation correction and anatomic localization. Fasting blood glucose: 119 mg/dl COMPARISON:  PET-CT 01/14/2022 and 10/01/2021. Abdominal MRI 01/06/2022. FINDINGS: Mediastinal blood pool activity: SUV max 1.3 NECK: No hypermetabolic cervical lymph nodes are identified. No suspicious activity identified within the pharyngeal mucosal space. Incidental CT findings: none CHEST: There is recurrent hypermetabolic activity in the left hilum associated with partially calcified lymph nodes. On the PET-CT of 42/68/3419, metabolic activity in this area pedis max of 4.9. This had resolved on the interval PET-CT from 01/14/2022. Currently, there is an SUV max of 3.4. CT appearance is grossly stable. No other hypermetabolic mediastinal or hilar lymph nodes are identified. No hypermetabolic pulmonary activity or suspicious nodularity. Incidental CT findings: Aortic and branch vessel atherosclerosis. A right arm PICC projects to the lower SVC level. Apparent previous left upper lobe resection with stable volume loss in the left hemithorax and perihilar scarring. There are new scattered ground-glass opacities in the right lower lobe, most notable on CT images 41 and 43 of series 7. In addition, there is a new part solid subpleural nodule in the right middle lobe which measures approximately 6 mm on image 66/7. No hypermetabolic activity is  seen within the findings. ABDOMEN/PELVIS: No residual hypermetabolic activity is seen within the pancreatic tail lesion (SUV max 1.5). There is low-level metabolic activity within the  left adrenal gland (SUV max 2.3) without focal mass lesion. No hypermetabolic activity within the liver, right adrenal gland or spleen. There is no hypermetabolic nodal activity in the abdomen or pelvis. Incidental CT findings: Nonobstructing calculus in the lower pole of the left kidney. Unchanged large cyst in the anterior interpolar region of the right kidney for which no follow-up imaging is recommended. Postsurgical changes in the stomach, small bowel and distal colon. Aortic and branch vessel atherosclerosis. SKELETON: There are multiple new foci of hypermetabolic activity in the bones consistent with metastatic disease. Representative lesions include a focus in the proximal right humeral shaft (SUV max 2.2), a T11 lesion (SUV max 3.1) a left iliac lesion (SUV max 4.6), a right iliac lesion (SUV max 3.6) and a proximal left femoral diaphyseal lesion (SUV max 3.1). Incidental CT findings: No evidence of pathologic fracture. Mild spondylosis. IMPRESSION: 1. New multifocal hypermetabolic osseous lesions consistent with metastatic disease. 2. No definite signs of extra osseous metastatic disease. There is no residual or recurrent abnormal activity within the pancreatic tail. 3. Recurrent hypermetabolic activity in the left hilum associated with partially calcified lymph nodes. This is nonspecific, and could be reactive. It seems the patient has a history of lung cancer for which previous upper lobe resection has been performed, and left hilar recurrence/metastatic disease is not completely excluded. 4. New ground-glass and part solid pulmonary opacities in the right lung without hypermetabolic activity, likely inflammatory. 5.  Aortic Atherosclerosis (ICD10-I70.0). Electronically Signed   By: Richardean Sale M.D.   On: 04/23/2022  09:41   [place] EKG SITE RITE  Result Date: 04/10/2022 If Site Rite image not attached, placement could not be confirmed due to current cardiac rhythm.  ECHO TEE  Result Date: 04/09/2022    TRANSESOPHOGEAL ECHO REPORT   Patient Name:   RICHARDO POPOFF Date of Exam: 04/09/2022 Medical Rec #:  [id number]        Height:       68.0 in Accession #:    [id number]       Weight:       119.9 lb Date of Birth:  [birth year]       BSA:          1.644 m Patient Age:    77 years         BP:           130/80 mmHg Patient Gender: M                HR:           78 bpm. Exam Location:  [name] Procedure: Transesophageal Echo, Color Doppler and Cardiac Doppler Indications:    Bacteremia  History:        Patient has prior history of Echocardiogram examinations, most                 recent 04/04/2022. Signs/Symptoms:Chest Pain; Risk                 Factors:Hypertension and Dyslipidemia. CKD, Colon CA.  Sonographer:    Wenda Low Referring Phys: [id number] Alphonse Guild BRANCH PROCEDURE: The transesophogeal probe was passed without difficulty through the esophogus of the patient. Sedation performed by different physician. The patient developed no complications during the procedure.  IMPRESSIONS  1. Left ventricular ejection fraction, by estimation, is 60 to 65%. The left ventricle has normal function.  2. Right ventricular systolic function is normal. The right ventricular size is normal.  3. No left atrial/left  atrial appendage thrombus was detected. The LAA emptying velocity was 97 cm/s.  4. The mitral valve is normal in structure. No evidence of mitral valve regurgitation. No evidence of mitral stenosis.  5. The aortic valve is tricuspid. Aortic valve regurgitation is not visualized. No aortic stenosis is present. Conclusion(s)/Recommendation(s): No evidence of vegetation/infective endocarditis on this transesophageael echocardiogram. FINDINGS  Left Ventricle: Left ventricular ejection fraction, by estimation, is 60 to 65%. The  left ventricle has normal function. The left ventricular internal cavity size was normal in size. Right Ventricle: The right ventricular size is normal. Right vetricular wall thickness was not well visualized. Right ventricular systolic function is normal. Left Atrium: Left atrial size was normal in size. No left atrial/left atrial appendage thrombus was detected. The LAA emptying velocity was 97 cm/s. Right Atrium: Right atrial size was normal in size. Pericardium: There is no evidence of pericardial effusion. Mitral Valve: The mitral valve is normal in structure. No evidence of mitral valve regurgitation. No evidence of mitral valve stenosis. Tricuspid Valve: The tricuspid valve is normal in structure. Tricuspid valve regurgitation is mild . No evidence of tricuspid stenosis. Aortic Valve: The aortic valve is tricuspid. Aortic valve regurgitation is not visualized. No aortic stenosis is present. Pulmonic Valve: The pulmonic valve was not well visualized. Pulmonic valve regurgitation is not visualized. No evidence of pulmonic stenosis. Aorta: The aortic root is normal in size and structure. IAS/Shunts: No atrial level shunt detected by color flow Doppler.   AORTA Ao Root diam: 3.50 cm Carlyle Dolly MD Electronically signed by Carlyle Dolly MD Signature Date/Time: 04/09/2022/2:49:02 PM    Final     Labs:  CBC: Recent Labs    04/06/22 0524 04/07/22 0402 04/12/22 0746 04/19/22 1358  WBC 7.9 7.8 6.5 9.1  HGB 8.2* 8.2* 8.3* 9.8*  HCT 24.7* 24.9* 26.5* 31.1*  PLT 96* 124* 210 223    COAGS: Recent Labs    04/02/22 1401  INR 1.5*  APTT 44*    BMP: Recent Labs    04/10/22 0805 04/11/22 0454 04/12/22 0443 04/19/22 1358  NA 138 138 137 133*  K 3.8 4.3 4.4 5.1  CL 114* 114* 115* 108  CO2 17* 20* 20* 18*  GLUCOSE 103* 92 118* 120*  BUN 12 13 11 21   CALCIUM 7.3* 7.5* 7.6* 7.7*  CREATININE 1.18 1.14 1.16 1.20  GFRNONAA >60 >60 >60 >60    LIVER FUNCTION TESTS: Recent Labs     04/10/22 0805 04/11/22 0454 04/12/22 0443 04/19/22 1358  BILITOT 0.1* 0.3 0.3 0.2*  AST 26 22 20  39  ALT 35 32 30 40  ALKPHOS 79 74 80 100  PROT 5.0* 5.0* 5.2* 6.8  ALBUMIN 1.6* 1.6* 1.8* 2.8*    TUMOR MARKERS: No results for input(s): "AFPTM", "CEA", "CA199", "CHROMGRNA" in the last 8760 hours.  Assessment and Plan:  Pancreatic cancer with suspected bony metastases; durable venous access needed for continued systemic therapies: Delfin B. Reichow, 72 year old male, presents today to the [facility] department for an image-guided port-a-catheter placement and image-guided right iliac bone lesion biopsy.  Risks and benefits of image-guided port-a-catheter placement were discussed with the patient including, but not limited to bleeding, infection, pneumothorax, or fibrin sheath development and need for additional procedures.  Risks and benefits of bone lesion biopsy were discussed with the patient and/or patient's family including, but not limited to bleeding, infection, damage to adjacent structures or low yield requiring additional tests.  All of the questions were answered and  there is agreement to proceed. He has been NPO. Eliquis has not been held for today's procedures. He is a full code ***  Consent signed and in chart.  Thank you for this interesting consult.  I greatly enjoyed meeting [name] and look forward to participating in their care.  A copy of this report was sent to the requesting provider on this date.  Electronically Signed: Soyla Dryer, AGACNP-BC [phone] 05/08/2022, 2:19 PM   I spent a total of  30 Minutes   in face to face in clinical consultation, greater than 50% of which was counseling/coordinating care for port-a-catheter placement; bone lesion biopsy

## 2022-05-10 ENCOUNTER — Ambulatory Visit (HOSPITAL_COMMUNITY)
Admission: RE | Admit: 2022-05-10 | Discharge: 2022-05-10 | Disposition: A | Discharge status: Home / Self Care | Payer: Medicare Other | Source: Ambulatory Visit | Attending: Hematology | Admitting: Hematology

## 2022-05-10 ENCOUNTER — Other Ambulatory Visit: Payer: Self-pay

## 2022-05-10 DIAGNOSIS — M899 Disorder of bone, unspecified: Secondary | ICD-10-CM

## 2022-05-10 DIAGNOSIS — C3492 Malignant neoplasm of unspecified part of left bronchus or lung: Secondary | ICD-10-CM

## 2022-05-10 DIAGNOSIS — Z7901 Long term (current) use of anticoagulants: Secondary | ICD-10-CM | POA: Insufficient documentation

## 2022-05-10 DIAGNOSIS — Z85038 Personal history of other malignant neoplasm of large intestine: Secondary | ICD-10-CM | POA: Insufficient documentation

## 2022-05-10 DIAGNOSIS — Z85118 Personal history of other malignant neoplasm of bronchus and lung: Secondary | ICD-10-CM | POA: Diagnosis not present

## 2022-05-10 DIAGNOSIS — Z86711 Personal history of pulmonary embolism: Secondary | ICD-10-CM | POA: Insufficient documentation

## 2022-05-10 DIAGNOSIS — Z87891 Personal history of nicotine dependence: Secondary | ICD-10-CM | POA: Insufficient documentation

## 2022-05-10 DIAGNOSIS — I10 Essential (primary) hypertension: Secondary | ICD-10-CM | POA: Insufficient documentation

## 2022-05-10 DIAGNOSIS — C252 Malignant neoplasm of tail of pancreas: Secondary | ICD-10-CM

## 2022-05-10 DIAGNOSIS — C7951 Secondary malignant neoplasm of bone: Secondary | ICD-10-CM | POA: Insufficient documentation

## 2022-05-10 HISTORY — PX: IR IMAGING GUIDED PORT INSERTION: IMG5740

## 2022-05-10 LAB — CBC
HCT: 30.8 % — ABNORMAL LOW (ref 39.0–52.0)
Hemoglobin: 10.2 g/dL — ABNORMAL LOW (ref 13.0–17.0)
MCH: 32.4 pg (ref 26.0–34.0)
MCHC: 33.1 g/dL (ref 30.0–36.0)
MCV: 97.8 fL (ref 80.0–100.0)
Platelets: 153 10*3/uL (ref 150–400)
RBC: 3.15 MIL/uL — ABNORMAL LOW (ref 4.22–5.81)
RDW: 16.1 % — ABNORMAL HIGH (ref 11.5–15.5)
WBC: 7.6 10*3/uL (ref 4.0–10.5)
nRBC: 0 % (ref 0.0–0.2)

## 2022-05-10 LAB — PROTIME-INR
INR: 1 (ref 0.8–1.2)
Prothrombin Time: 13.4 seconds (ref 11.4–15.2)

## 2022-05-10 MED ORDER — SODIUM CHLORIDE 0.9 % IV SOLN: INTRAVENOUS | Status: DC

## 2022-05-10 MED ORDER — MIDAZOLAM HCL 2 MG/2ML IJ SOLN
INTRAMUSCULAR | Status: AC | PRN
  Administered 2022-05-10: 1 mg via INTRAVENOUS

## 2022-05-10 MED ORDER — FENTANYL CITRATE (PF) 100 MCG/2ML IJ SOLN
INTRAMUSCULAR | Status: AC
  Filled 2022-05-10: qty 4

## 2022-05-10 MED ORDER — FENTANYL CITRATE (PF) 100 MCG/2ML IJ SOLN
INTRAMUSCULAR | Status: AC | PRN
  Administered 2022-05-10: 50 ug via INTRAVENOUS

## 2022-05-10 MED ORDER — LIDOCAINE HCL 1 % IJ SOLN: 10.0000 mL | Freq: Once | INTRAMUSCULAR | Status: DC

## 2022-05-10 MED ORDER — HEPARIN SOD (PORK) LOCK FLUSH 100 UNIT/ML IV SOLN
INTRAVENOUS | Status: AC | PRN
  Administered 2022-05-10: 500 [IU] via INTRAVENOUS

## 2022-05-10 MED ORDER — MIDAZOLAM HCL 2 MG/2ML IJ SOLN
INTRAMUSCULAR | Status: AC
  Filled 2022-05-10: qty 4

## 2022-05-10 MED ORDER — HEPARIN SOD (PORK) LOCK FLUSH 100 UNIT/ML IV SOLN
INTRAVENOUS | Status: AC
  Filled 2022-05-10: qty 5

## 2022-05-10 MED ORDER — LIDOCAINE HCL 1 % IJ SOLN
INTRAMUSCULAR | Status: AC
  Administered 2022-05-10: 20 mL
  Filled 2022-05-10: qty 20

## 2022-05-10 NOTE — Procedures (Signed)
Interventional Radiology Procedure:   Indications: History of pancreatic cancer and suspicious bone lesions  Procedure: Port placement and CT guided biopsy of right iliac bone lesion  Findings: Right jugular port, tip at SVC/RA junction.  3 cores from right iliac bone lesion.  Complications: None     EBL: Minimal, less than 10 ml  Plan: Discharge in one hour.  Keep port site and incisions dry for at least 24 hours.     [name] Locicero R. Anselm Pancoast, MD  Pager: [phone]

## 2022-05-12 ENCOUNTER — Encounter: Payer: Self-pay | Admitting: *Deleted

## 2022-05-12 NOTE — Progress Notes (Signed)
Verbal order received to have infusion nurse remove PICC and use implanted port for TPN.  Lonn Georgia, RN with [facility] made aware and will send order for signature.

## 2022-05-13 ENCOUNTER — Other Ambulatory Visit: Payer: Self-pay | Admitting: *Deleted

## 2022-05-13 LAB — SURGICAL PATHOLOGY

## 2022-05-13 MED ORDER — HYDROCODONE-ACETAMINOPHEN 5-325 MG PO TABS: 1.0000 | ORAL_TABLET | Freq: Three times a day (TID) | ORAL | 0 refills | Status: DC | PRN

## 2022-05-18 ENCOUNTER — Ambulatory Visit (HOSPITAL_COMMUNITY)
Admission: RE | Admit: 2022-05-18 | Discharge: 2022-05-18 | Disposition: A | Discharge status: Home / Self Care | Payer: Medicare Other | Source: Ambulatory Visit | Attending: Hematology | Admitting: Hematology

## 2022-05-18 ENCOUNTER — Inpatient Hospital Stay (HOSPITAL_BASED_OUTPATIENT_CLINIC_OR_DEPARTMENT_OTHER): Payer: Medicare Other | Admitting: Hematology

## 2022-05-18 VITALS — BP 133/83 | HR 96 | Temp 97.9°F | Resp 18 | Ht 68.0 in | Wt 142.0 lb

## 2022-05-18 DIAGNOSIS — M898X5 Other specified disorders of bone, thigh: Secondary | ICD-10-CM

## 2022-05-18 DIAGNOSIS — C252 Malignant neoplasm of tail of pancreas: Secondary | ICD-10-CM | POA: Diagnosis not present

## 2022-05-18 DIAGNOSIS — C3492 Malignant neoplasm of unspecified part of left bronchus or lung: Secondary | ICD-10-CM | POA: Insufficient documentation

## 2022-05-18 NOTE — Patient Instructions (Addendum)
[place] at [facility] Discharge Instructions   You were seen and examined today by Dr. Delton Coombes.  He reviewed the results of your biopsy. It is showing that the area on your  bone is lung cancer that has spread to the bone. This makes you ineligible for surgery for the pancreatic cancer since the lung cancer has spread to the bone. 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   We have sent your biopsy tissue to be tested for special testing to help guide [place] with treatment options. Those results are pending at this time. We will discuss with you treatment options once we have those results back.  Dr. Raliegh Ip recommends weaning off of the TPN since surgery is no longer an option. Decrease TPN administration to 12 hours a day vs 24 hours per day. Try to increase your by mouth food intake as you are weaning the TPN.   Please stop by the outpatient radiology office on your way out today to get an x-ray of your left thigh.   Return as scheduled.    Thank you for choosing [place] at [facility] to provide your oncology and hematology care.  To afford each patient quality time with our provider, please arrive at least 15 minutes before your scheduled appointment time.   If you have a lab appointment with the [facility] please come in thru the Main Entrance and check in at the main information desk.  You need to re-schedule your appointment should you arrive 10 or more minutes late.  We strive to give you quality time with our providers, and arriving late affects you and other patients whose appointments are after yours.  Also, if you no show three or more times for appointments you may be dismissed from the clinic at the providers discretion.     Again, thank you for choosing [facility].  Our hope is that these requests will decrease the amount of time that you wait before being seen by our physicians.        _____________________________________________________________  Should you have questions after your visit to [facility], please contact our office at [phone] and follow the prompts.  Our office hours are 8:00 a.m. and 4:30 p.m. Monday - Friday.  Please note that voicemails left after 4:00 p.m. may not be returned until the following business day.  We are closed weekends and major holidays.  You do have access to a nurse 24-7, just call the main number to the clinic [phone] and do not press any options, hold on the line and a nurse will answer the phone.    For prescription refill requests, have your pharmacy contact our office and allow 72 hours.    Due to Covid, you will need to wear a mask upon entering the hospital. If you do not have a mask, a mask will be given to you at the Main Entrance upon arrival. For doctor visits, patients may have 1 support person age 69 or older with them. For treatment visits, patients can not have anyone with them due to social distancing guidelines and our immunocompromised population.

## 2022-05-18 NOTE — Progress Notes (Signed)
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   CURRENT THERAPY: FOLFIRINOX every 2 weeks  BRIEF ONCOLOGIC HISTORY:  Oncology History  Pancreatic cancer ([place])  10/07/2021 Initial Diagnosis   Pancreatic cancer ([place])   10/13/2021 - 11/19/2021 Chemotherapy   Patient is on Treatment Plan : PANCREAS Modified FOLFIRINOX q14d x 4 cycles     10/13/2021 -  Chemotherapy   Patient is on Treatment Plan : PANCREAS Modified FOLFIRINOX q14d x 4 cycles       CANCER STAGING:  Cancer Staging  Adenocarcinoma of colon with mucinous features Staging form: Colon and Rectum, AJCC 7th Edition - Clinical: Stage IIIB (T3, N1, M0) - Signed by Baird Cancer, PA on 09/22/2010  Pancreatic cancer [facility]) Staging form: Exocrine Pancreas, AJCC 8th Edition - Clinical stage from 10/07/2021: Stage IIB (cT1, cN1, cM0) - Unsigned    INTERVAL HISTORY:  [name] 72 y.o. male seen for follow-up of pancreatic cancer.  He had a PET scan done on 04/22/2022 followed by right iliac bone biopsy on 05/10/2022.  He is continuing on TPN.  In the interim he also had a port placed and PICC line was discontinued.  He reports that he has been eating by mouth.  Reports some right shoulder pain and left mid thigh pain for the last 1 week.  Chronic tingling in the feet has been stable.  Energy levels are reported as 50%.  REVIEW OF SYSTEMS:  Review of Systems  Respiratory:  Positive for shortness of breath.   Gastrointestinal:  Positive for diarrhea and nausea.  Genitourinary:   Negative for bladder incontinence, difficulty urinating, dysuria, frequency, hematuria and nocturia.   Neurological:  Positive for numbness (burning in feet). Negative for dizziness and headaches.  Hematological:  Does not bruise/bleed easily.  Psychiatric/Behavioral:  Negative for depression and suicidal ideas. The patient is not nervous/anxious.   All other systems reviewed and are negative.    PAST MEDICAL/SURGICAL HISTORY:  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   Stage 3   Anemia    Anxiety    Arthritis    Barrett's esophagus    Blood transfusion    Bowel obstruction ([place]) 05/13/2012   Recurrent   Bronchitis    Chest pain at rest    Chronic abdominal pain    Erosive esophagitis    ETOH abuse    quit 03/2010   GERD (gastroesophageal reflux disease)    Hx of Clostridium difficile infection 01/2012   Hypertension    Ileus ([place])    Iron deficiency anemia 03/23/2016   Lung cancer ([place])    Obstruction of bowel ([place]) 03/03/2014   Osteoporosis    Pancreatic cancer ([facility])    Personal history of PE (pulmonary embolism) 10/01/2010   Pneumonia    Pulmonary embolism ([place]) 02/2010   Recurrent upper respiratory infection (URI)    Renal disorder    S/P endoscopy 09/28/2010   erosive reflux esophagitis, Billroth I anatomy   S/P partial gastrectomy 1980s   Seizures ([place])    was on meds for 6  months, Unknown etiology, last seizure was 2017.   Shortness of breath    TIA (transient ischemic attack) 12/2009   Vitamin B12 deficiency    Past Surgical History:  Procedure Laterality Date   ABDOMINAL ADHESION SURGERY  03/04/15   @ [facility]   ABDOMINAL EXPLORATION SURGERY     abdominal sugery     for bowel obstruction x 8, all in 1980s, except for one in 07/2010   APPENDECTOMY  1980s   Billroth 1 hemigastrectomy  1980s   per patient for benign duodenal tumor   BIOPSY  01/10/2022   Procedure: BIOPSY;  Surgeon: Eloise Harman, DO;   [facility];  Service: Endoscopy;;  gastric,ileum   CARDIAC CATHETERIZATION  07/17/2012   CHOLECYSTECTOMY  1980s   COLON SURGERY  May 2012   left hemicolectomy, colon cancer found at time of surgery for bowel obstruction   COLON SURGERY  10/12/2018   At [facility]: Resection of right colon and terminal ileum with creation of ileorectal anastomosis for large bowel obstruction.  Cecum and ascending colon noted to be attached to the rectum.   COLONOSCOPY  03/18/2011   anastomosis at 35cm. Several adenomatous polyps removed. Sigmoid diverticulosis. Next TCS 02/2013   COLONOSCOPY N/A 07/24/2012   [id number] post segmental resection with normal-appearing colonic anastomosis aside from an adjacent polyp-removed as described above. Rectal polyp-removed as described above. CT findings appear to have been artifactual. tubular adenomas/prolapsed type polyp.   COLONOSCOPY N/A 05/15/2015   Procedure: COLONOSCOPY;  Surgeon: [name], MD;  [facility];  Service: Endoscopy;  Laterality: N/A;   COLONOSCOPY  09/26/2018   at [facility]: prep not adequate for colon cancer surveillance.  Prior end-to-end colonic anastomosis in the rectosigmoid region.  This was patent and characterized by healthy-appearing mucosa.  Anastomosis was traversed.  Normal terminal ileum.   COLONOSCOPY WITH PROPOFOL N/A 11/04/2016   Dr. [name], status post subtotal colectomy with normal-appearing residual lower GI tract.  Next colonoscopy in 5 years.   ENTEROSCOPY N/A 04/07/2022   Procedure: ENTEROSCOPY;  Surgeon: [name], MD;  [facility];  Service: Endoscopy;  Laterality: N/A;   ESOPHAGOGASTRODUODENOSCOPY  09/28/2010   ESOPHAGOGASTRODUODENOSCOPY  12/01/2010   Cervical web status post dilation, erosive esophagitis, B1 hemigastrectomy, inflamed anastomosis   ESOPHAGOGASTRODUODENOSCOPY  04/16/2011   excoriation at [facility] c/w trauma/M-W tear, friable gastric anastomosis, dilation efferent limb    ESOPHAGOGASTRODUODENOSCOPY N/A 06/03/2014   Dr.[name]- cervcal esopphageal web s/p dilation. abnormal distal esophagus bx= barretts esophagus   ESOPHAGOGASTRODUODENOSCOPY N/A 05/15/2015   Procedure: ESOPHAGOGASTRODUODENOSCOPY (EGD);  Surgeon: [name], MD;  [facility];  Service: Endoscopy;  Laterality: N/A;  230   ESOPHAGOGASTRODUODENOSCOPY (EGD) WITH ESOPHAGEAL DILATION  02/25/2012   [id number] esophageal web-s/p dilation anddisruption as described above. Status post prior gastric with Billroth I configuration. Abnormal gastric mucosa at the anastomosis. Gastric biopsy showed mild chronic inflammation but no H. pylori    ESOPHAGOGASTRODUODENOSCOPY (EGD) WITH PROPOFOL N/A 11/04/2016   Dr. [name]: Reflux esophagitis, small hiatal hernia status post hemigastrectomy.  Single patent efferent small bowel limb appeared normal, 2 x 2 centimeter tongue of salmon epithelium again seen, esophageal dilation.  Biopsies consistent with reflux changes, not Barrett's however this was confirmed on prior EGDs.  Offer 3-year follow-up EGD August 2021.   ESOPHAGOGASTRODUODENOSCOPY (EGD) WITH PROPOFOL N/A 03/03/2020   Procedure: ESOPHAGOGASTRODUODENOSCOPY (EGD) WITH PROPOFOL;  Surgeon: [name], MD;  [facility];  Service: Endoscopy;  Laterality: N/A;  3:00pm   ESOPHAGOGASTRODUODENOSCOPY (EGD) WITH PROPOFOL N/A 01/10/2022   Procedure: ESOPHAGOGASTRODUODENOSCOPY (EGD) WITH PROPOFOL;  Surgeon: Eloise Harman, DO;  [facility];  Service: Endoscopy;  Laterality: N/A;   ESOPHAGOGASTRODUODENOSCOPY (EGD) WITH PROPOFOL N/A 04/07/2022   Procedure: ESOPHAGOGASTRODUODENOSCOPY (EGD) WITH PROPOFOL;  Surgeon: [name], MD;  [facility];  Service: Endoscopy;  Laterality: N/A;   FLEXIBLE SIGMOIDOSCOPY N/A 01/10/2022   Procedure: FLEXIBLE SIGMOIDOSCOPY;  Surgeon: Eloise Harman, DO;  [facility];  Service: Endoscopy;  Laterality: N/A;   HERNIA REPAIR      right inguinal   HOT HEMOSTASIS  04/07/2022   Procedure: HOT HEMOSTASIS (ARGON PLASMA COAGULATION/BICAP);  Surgeon: [name], MD;  [facility];  Service: Endoscopy;;   IR IMAGING GUIDED PORT INSERTION  05/10/2022   MALONEY DILATION N/A 06/03/2014   Procedure: [name] DILATION;  Surgeon: [name], MD;  [facility];  Service: Endoscopy;  Laterality: N/A;   MALONEY DILATION N/A 05/15/2015   Procedure: [name] DILATION;  Surgeon: [name], MD;  [facility];  Service: Endoscopy;  Laterality: N/A;   MALONEY DILATION N/A 11/04/2016   Procedure: [name] DILATION;  Surgeon: [name], MD;  [facility];  Service: Endoscopy;  Laterality: N/A;   MALONEY DILATION N/A 03/03/2020   Procedure: [name] DILATION;  Surgeon: [name], MD;  [facility];  Service: Endoscopy;  Laterality: N/A;   PORT-A-CATH REMOVAL Left 04/08/2022   Procedure: MINOR REMOVAL PORT-A-CATH;  Surgeon: Rusty Aus, DO;  Location: [facility];  Service: General;  Laterality: Left;   PORTACATH PLACEMENT     SAVORY DILATION N/A 06/03/2014   Procedure: SAVORY DILATION;  Surgeon: [name], MD;  [facility];  Service: Endoscopy;  Laterality: N/A;   SUBMUCOSAL INJECTION  04/07/2022   Procedure: SUBMUCOSAL INJECTION;  Surgeon: [name], MD;  [facility];  Service: Endoscopy;;   SUBMUCOSAL TATTOO INJECTION  04/07/2022   Procedure: SUBMUCOSAL TATTOO INJECTION;  Surgeon: [name], MD;  [facility];  Service: Endoscopy;;   TEE WITHOUT CARDIOVERSION N/A 04/09/2022   Procedure: TRANSESOPHAGEAL ECHOCARDIOGRAM (TEE);  Surgeon: Arnoldo Lenis, MD;  Location[facility];  Service: Endoscopy;  Laterality: N/A;     SOCIAL HISTORY:  Social History   Socioeconomic History   Marital status: Married    Spouse name: Not on file   Number of children: 3   Years of education: Not on file   Highest education  level: Not on file  Occupational History    Employer: [employer]  Tobacco Use   Smoking status: Former    Packs/day: 0.50    Years: 40.00    Total pack years: 20.00    Types: Cigarettes    Quit date: 12/20/2012    Years since quitting: 9.4   Smokeless tobacco: Never  Vaping Use   Vaping Use: Never used  Substance and Sexual Activity   Alcohol use: No   Drug use: No   Sexual activity: Not Currently  Other Topics Concern   Not on file  Social History Narrative   Not on file   Social Determinants of Health   Financial Resource Strain: Not on file  Food Insecurity: No Food Insecurity (04/03/2022)   Hunger Vital Sign    Worried About Running Out of Food in the Last Year: Never true    Ran Out of Food in  the Last Year: Never true  Transportation Needs: No Transportation Needs (04/03/2022)   PRAPARE - Hydrologist (Medical): No    Lack of Transportation (Non-Medical): No  Physical Activity: Not on file  Stress: Not on file  Social Connections: Not on file  Intimate Partner Violence: Not At Risk (04/03/2022)   Humiliation, Afraid, Rape, and Kick questionnaire    Fear of Current or Ex-Partner: No    Emotionally Abused: No    Physically Abused: No    Sexually Abused: No    FAMILY HISTORY:  Family History  Problem Relation Age of Onset   Hypertension Mother    Arthritis Mother    Pneumonia Mother    Hypertension Father    Heart attack Father    Bone cancer Cousin    Colon cancer Neg Hx     CURRENT MEDICATIONS:   Current Outpatient Medications:    apixaban (ELIQUIS) 5 MG TABS tablet, Take 1 tablet (5 mg total) by mouth 2 (two) times daily. Restart 01/11/22, Disp: 60 tablet, Rfl: 6   atorvastatin (LIPITOR) 20 MG tablet, Take 1 tablet (20 mg total) by mouth daily., Disp: , Rfl:    Bacillus Coagulans-Inulin (PROBIOTIC) 1-250 BILLION-MG CAPS, Take 1 capsule by mouth daily., Disp: , Rfl:    bacitracin ointment, Apply topically daily., Disp:  120 g, Rfl: 0   chlorproMAZINE (THORAZINE) 25 MG tablet, Take 1 tablet (25 mg total) by mouth every 6 (six) hours as needed. (Patient taking differently: Take 25 mg by mouth every 6 (six) hours as needed for nausea or vomiting.), Disp: 30 tablet, Rfl: 0   Cholecalciferol (VITAMIN D) 2000 units CAPS, Take 2,000 Units by mouth daily., Disp: , Rfl:    CREON 36000-114000 units CPEP capsule, Take 72,000-108,000 Units by mouth See admin instructions. Take 3 capsules (108,000 units) by mouth with meals and take 2 capsules (72,000 units) with snacks., Disp: , Rfl:    cyanocobalamin 100 MCG tablet, Take by mouth., Disp: , Rfl:    DAPTOmycin (CUBICIN) 500 MG injection, , Disp: , Rfl:    diclofenac Sodium (VOLTAREN) 1 % GEL, Apply 4 g topically 4 (four) times daily., Disp: , Rfl:    dicyclomine (BENTYL) 10 MG capsule, Take 10 mg by mouth 2 (two) times daily before a meal., Disp: , Rfl:    diphenoxylate-atropine (LOMOTIL) 2.5-0.025 MG tablet, Take 1 tablet by mouth 4 times daily, Disp: 120 tablet, Rfl: 1   escitalopram (LEXAPRO) 10 MG tablet, TAKE 1 TABLET BY MOUTH EVERY DAY, Disp: 30 tablet, Rfl: 1   escitalopram (LEXAPRO) 20 MG tablet, Take 1 tablet (20 mg total) by mouth daily., Disp: 90 tablet, Rfl: 0   feeding supplement (ENSURE ENLIVE / ENSURE PLUS) LIQD, Take 237 mLs by mouth 2 (two) times daily between meals., Disp: , Rfl:    ferrous sulfate 325 (65 FE) MG EC tablet, Take by mouth., Disp: , Rfl:    gabapentin (NEURONTIN) 100 MG capsule, Take 1 capsule by mouth at bedtime, Disp: 30 capsule, Rfl: 4   HYDROcodone-acetaminophen (NORCO/VICODIN) 5-325 MG tablet, Take 1 tablet by mouth every 8 (eight) hours as needed for moderate pain., Disp: 45 tablet, Rfl: 0   hydrocortisone cream 1 %, Apply 1 Application topically 2 (two) times daily., Disp: , Rfl:    magnesium oxide (MAG-OX) 400 (240 Mg) MG tablet, TAKE 1 TABLET BY MOUTH TWICE A DAY, Disp: 180 tablet, Rfl: 3   megestrol (MEGACE) 40 MG/ML suspension, TAKE  10 MLS (  400 MG TOTAL) BY MOUTH 2 (TWO) TIMES DAILY., Disp: 480 mL, Rfl: 1   mirtazapine (REMERON) 30 MG tablet, TAKE 1 TABLET BY MOUTH AT BEDTIME, Disp: 30 tablet, Rfl: 3   Multiple Vitamin (INFUVITE ADULT IV), 5 mLs See admin instructions. Inject 5 mls from each vial #1 and #2, Disp: , Rfl:    Multiple Vitamin (MULTIVITAMIN WITH MINERALS) TABS tablet, Take 1 tablet by mouth daily., Disp: , Rfl:    ondansetron (ZOFRAN-ODT) 4 MG disintegrating tablet, TAKE 1 TABLET BY MOUTH EVERY 4 HOURS AS NEEDED FOR NAUSEA AND VOMITING, Disp: 30 tablet, Rfl: 1   pantoprazole (PROTONIX) 40 MG tablet, Take 1 tablet (40 mg total) by mouth 2 (two) times daily before a meal., Disp: 180 tablet, Rfl: 3   PREVALITE 4 g packet, Take 1 packet (4 g total) by mouth daily. DO NOT TAKE WITHIN 2 HOURS OF OTHER MEDICATIONS, Disp: 90 packet, Rfl: 3   prochlorperazine (COMPAZINE) 10 MG tablet, Take 10 mg by mouth every 6 (six) hours as needed for vomiting or nausea., Disp: , Rfl:    sterile water SOLN with Travasol 10 % SOLN 50 g/L, dextrose 70 % SOLN 20 %, sodium INJ 35 mEq/L, potassium INJ 30 mEq/L, calcium GLUCONATE INJ 4.7 mEq/L, magnesium SULFATE INJ 5 mEq/L, phosphorus INJ 15 mmol/L, M.V.I. Adult INJ 10 mL, trace elements Cr-Cu-Mn-Se-Zn (MTE-5 Concentrate) 12-998-500-60 MCG/ML SOLN 1 mL, fat emulsion 30 % EMUL 100 mL/L, continuous., Disp: , Rfl:    sucralfate (CARAFATE) 1 GM/10ML suspension, TAKE 10 MLS (1 G TOTAL) BY MOUTH 4 (FOUR) TIMES DAILY AS NEEDED. FOR BREAKTHROUGH HEARTBURN (Patient taking differently: Take 1 g by mouth 4 (four) times daily as needed (breakthrough heartburn).), Disp: 420 mL, Rfl: 5   traMADol (ULTRAM) 50 MG tablet, Take 1 tablet by mouth every 6 (six) hours as needed for moderate pain or severe pain., Disp: , Rfl:  No current facility-administered medications for this visit.  Facility-Administered Medications Ordered in Other Visits:    acetaminophen (TYLENOL) 325 MG tablet, , , ,    heparin lock flush  100 unit/mL, 500 Units, Intravenous, Once, Kefalas, Thomas S, PA-C   loratadine (CLARITIN) 10 MG tablet, , , ,    sodium chloride flush (NS) 0.9 % injection 10 mL, 10 mL, Intravenous, PRN, Holley Bouche, NP, 10 mL at 11/05/16 1136    ALLERGIES:  No Known Allergies   PHYSICAL EXAM:  ECOG Performance status: 1  Vitals:   05/18/22 1314  BP: 133/83  Pulse: 96  Resp: 18  Temp: 97.9 F (36.6 C)  SpO2: 99%    Filed Weights   05/18/22 1314  Weight: 142 lb (64.4 kg)    Physical Exam Vitals and nursing note reviewed. Exam conducted with a chaperone present.  Constitutional:      Appearance: Normal appearance.  Cardiovascular:     Rate and Rhythm: Normal rate and regular rhythm.     Pulses: Normal pulses.     Heart sounds: Normal heart sounds.  Pulmonary:     Effort: Pulmonary effort is normal.     Breath sounds: Normal breath sounds.  Abdominal:     Palpations: Abdomen is soft. There is no hepatomegaly, splenomegaly or mass.     Tenderness: There is no abdominal tenderness.  Lymphadenopathy:     Cervical: No cervical adenopathy.     Right cervical: No superficial cervical adenopathy.    Left cervical: No superficial cervical adenopathy.  Neurological:     General: No focal  deficit present.     Mental Status: He is alert and oriented to person, place, and time.  Psychiatric:        Mood and Affect: Mood normal.        Behavior: Behavior normal.      LABORATORY DATA:  I have reviewed the labs as listed.  CBC    Component Value Date/Time   WBC 7.6 05/10/2022 0913   RBC 3.15 (L) 05/10/2022 0913   HGB 10.2 (L) 05/10/2022 0913   HCT 30.8 (L) 05/10/2022 0913   PLT 153 05/10/2022 0913   MCV 97.8 05/10/2022 0913   MCH 32.4 05/10/2022 0913   MCHC 33.1 05/10/2022 0913   RDW 16.1 (H) 05/10/2022 0913   LYMPHSABS 1.8 04/19/2022 1358   MONOABS 0.7 04/19/2022 1358   EOSABS 0.1 04/19/2022 1358   BASOSABS 0.0 04/19/2022 1358      Latest Ref Rng & Units 04/19/2022     1:58 PM 04/12/2022    4:43 AM 04/11/2022    4:54 AM  CMP  Glucose 70 - 99 mg/dL 120  118  92   BUN 8 - 23 mg/dL '21  11  13   '$ Creatinine 0.61 - 1.24 mg/dL 1.20  1.16  1.14   Sodium 135 - 145 mmol/L 133  137  138   Potassium 3.5 - 5.1 mmol/L 5.1  4.4  4.3   Chloride 98 - 111 mmol/L 108  115  114   CO2 22 - 32 mmol/L '18  20  20   '$ Calcium 8.9 - 10.3 mg/dL 7.7  7.6  7.5   Total Protein 6.5 - 8.1 g/dL 6.8  5.2  5.0   Total Bilirubin 0.3 - 1.2 mg/dL 0.2  0.3  0.3   Alkaline Phos 38 - 126 U/L 100  80  74   AST 15 - 41 U/L 39  20  22   ALT 0 - 44 U/L 40  30  32     DIAGNOSTIC IMAGING:  I have independently reviewed the scans and discussed with the patient. IR IMAGING GUIDED PORT INSERTION  Result Date: 05/10/2022 INDICATION: 72 year old with history of pancreatic cancer and concern for new metastatic disease in the bone. Patient currently has a right arm PICC line being used for TPN. Left chest port was recently removed for bacteremia. Patient needs a new Port-A-Cath. EXAM: FLUOROSCOPIC AND ULTRASOUND GUIDED PLACEMENT OF A SUBCUTANEOUS PORT COMPARISON:  None Available. MEDICATIONS: Moderate sedation ANESTHESIA/SEDATION: Moderate (conscious) sedation was employed during this procedure. A total of Versed 1 mg and fentanyl 50 mcg was administered intravenously at the order of the provider performing the procedure. Total intra-service moderate sedation time: 27 minutes. Patient's level of consciousness and vital signs were monitored continuously by radiology nurse throughout the procedure under the supervision of the provider performing the procedure. FLUOROSCOPY TIME:  Radiation Exposure Index (as provided by the fluoroscopic device): 2 mGy Kerma COMPLICATIONS: None immediate. PROCEDURE: The procedure, risks, benefits, and alternatives were explained to the patient. Questions regarding the procedure were encouraged and answered. The patient understands and consents to the procedure. Patient was placed  supine on the interventional table. Ultrasound confirmed a patent right internal jugular vein. Ultrasound image was saved for documentation. The right chest and neck were cleaned with a skin antiseptic and a sterile drape was placed. Maximal barrier sterile technique was utilized including caps, mask, sterile gowns, sterile gloves, sterile drape, hand hygiene and skin antiseptic. The right neck was anesthetized with 1% lidocaine. Small incision  was made in the right neck with a blade. Micropuncture set was placed in the right internal jugular vein with ultrasound guidance. The micropuncture wire was used for measurement purposes. The right chest was anesthetized with 1% lidocaine with epinephrine. #15 blade was used to make an incision and a subcutaneous port pocket was formed. [place] was assembled. Subcutaneous tunnel was formed with a stiff tunneling device. The port catheter was brought through the subcutaneous tunnel. The port was placed in the subcutaneous pocket. The micropuncture set was exchanged for a peel-away sheath. The catheter was placed through the peel-away sheath and the tip was positioned at the superior cavoatrial junction. Catheter placement was confirmed with fluoroscopy. The port was accessed and flushed with heparinized saline. The port pocket was closed using two layers of absorbable sutures and Dermabond. The vein skin site was closed using a single layer of absorbable suture and Dermabond. Sterile dressings were applied. Patient tolerated the procedure well without an immediate complication. Ultrasound and fluoroscopic images were taken and saved for this procedure. IMPRESSION: Placement of a subcutaneous power-injectable port device. Catheter tip at the superior cavoatrial junction. Electronically Signed   By: Markus Daft M.D.   On: 05/10/2022 13:29   CT Biopsy  Result Date: 05/10/2022 INDICATION: 72 year old with history of pancreatic cancer. Recent PET-CT demonstrates  hypermetabolic bone lesions that are concerning for metastatic disease. Request for tissue sampling. EXAM: CT-GUIDED BIOPSY OF RIGHT ILIAC BONE LESION MEDICATIONS: Moderate sedation ANESTHESIA/SEDATION: Moderate (conscious) sedation was employed during this procedure. A total of Versed 1 mg and Fentanyl 50 mcg was administered intravenously by the radiology nurse. Total intra-service moderate Sedation Time: 17 minutes. The patient's level of consciousness and vital signs were monitored continuously by radiology nursing throughout the procedure under my direct supervision. FLUOROSCOPY TIME:  None COMPLICATIONS: None immediate. PROCEDURE: Informed written consent was obtained from the patient after a thorough discussion of the procedural risks, benefits and alternatives. All questions were addressed. A timeout was performed prior to the initiation of the procedure. Patient was placed prone and CT images through the pelvis were obtained. Subtle sclerotic lesion along the posterior right ilium was identified and this corresponds with the hypermetabolic activity on recent PET-CT. The overlying skin was prepped with chlorhexidine and a sterile field was created. Skin was anesthetized using 1% lidocaine. Using CT guidance, an 11 gauge bone needle was directed into the sclerotic bone lesion. Three core biopsies were obtained from the lesion with the 11 gauge needle. Needle placement was confirmed with CT. Specimens placed in formalin. Bandage placed over the puncture site. RADIATION DOSE REDUCTION: This exam was performed according to the departmental dose-optimization program which includes automated exposure control, adjustment of the mA and/or kV according to patient size and/or use of iterative reconstruction technique. FINDINGS: Subtle sclerotic lesion in posterior right ilium corresponding with previous PET-CT findings. IMPRESSION: CT-guided biopsy of right iliac bone sclerotic lesion. Electronically Signed   By: Markus Daft M.D.   On: 05/10/2022 13:20   NM PET Image Restag (PS) Skull Base To Thigh  Result Date: 04/23/2022 CLINICAL DATA:  Subsequent treatment strategy for pancreatic cancer. EXAM: NUCLEAR MEDICINE PET SKULL BASE TO THIGH TECHNIQUE: 6.33 mCi F-18 FDG was injected intravenously. Full-ring PET imaging was performed from the skull base to thigh after the radiotracer. CT data was obtained and used for attenuation correction and anatomic localization. Fasting blood glucose: 119 mg/dl COMPARISON:  PET-CT 01/14/2022 and 10/01/2021. Abdominal MRI 01/06/2022. FINDINGS: Mediastinal blood pool activity:  SUV max 1.3 NECK: No hypermetabolic cervical lymph nodes are identified. No suspicious activity identified within the pharyngeal mucosal space. Incidental CT findings: none CHEST: There is recurrent hypermetabolic activity in the left hilum associated with partially calcified lymph nodes. On the PET-CT of [id number], metabolic activity in this area pedis max of 4.9. This had resolved on the interval PET-CT from 01/14/2022. Currently, there is an SUV max of 3.4. CT appearance is grossly stable. No other hypermetabolic mediastinal or hilar lymph nodes are identified. No hypermetabolic pulmonary activity or suspicious nodularity. Incidental CT findings: Aortic and branch vessel atherosclerosis. A right arm PICC projects to the lower SVC level. Apparent previous left upper lobe resection with stable volume loss in the left hemithorax and perihilar scarring. There are new scattered ground-glass opacities in the right lower lobe, most notable on CT images 41 and 43 of series 7. In addition, there is a new part solid subpleural nodule in the right middle lobe which measures approximately 6 mm on image 66/7. No hypermetabolic activity is seen within the findings. ABDOMEN/PELVIS: No residual hypermetabolic activity is seen within the pancreatic tail lesion (SUV max 1.5). There is low-level metabolic activity within the left adrenal  gland (SUV max 2.3) without focal mass lesion. No hypermetabolic activity within the liver, right adrenal gland or spleen. There is no hypermetabolic nodal activity in the abdomen or pelvis. Incidental CT findings: Nonobstructing calculus in the lower pole of the left kidney. Unchanged large cyst in the anterior interpolar region of the right kidney for which no follow-up imaging is recommended. Postsurgical changes in the stomach, small bowel and distal colon. Aortic and branch vessel atherosclerosis. SKELETON: There are multiple new foci of hypermetabolic activity in the bones consistent with metastatic disease. Representative lesions include a focus in the proximal right humeral shaft (SUV max 2.2), a T11 lesion (SUV max 3.1) a left iliac lesion (SUV max 4.6), a right iliac lesion (SUV max 3.6) and a proximal left femoral diaphyseal lesion (SUV max 3.1). Incidental CT findings: No evidence of pathologic fracture. Mild spondylosis. IMPRESSION: 1. New multifocal hypermetabolic osseous lesions consistent with metastatic disease. 2. No definite signs of extra osseous metastatic disease. There is no residual or recurrent abnormal activity within the pancreatic tail. 3. Recurrent hypermetabolic activity in the left hilum associated with partially calcified lymph nodes. This is nonspecific, and could be reactive. It seems the patient has a history of lung cancer for which previous upper lobe resection has been performed, and left hilar recurrence/metastatic disease is not completely excluded. 4. New ground-glass and part solid pulmonary opacities in the right lung without hypermetabolic activity, likely inflammatory. 5.  Aortic Atherosclerosis (ICD10-I70.0). Electronically Signed   By: Richardean Sale M.D.   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  No lymphadenopathy. - EUS and FNA (09/21/2021): Oval mass in the pancreatic tail, hypoechoic, 20 mm x 17 mm.  Remainder of the pancreas shows decreased visualization of the pancreas. - Pathology: Invasive adenocarcinoma, ductal type, moderately to poorly differentiated.  Background desmoplastic stroma, blood and pancreatic tissue.  Morphologically distinct from prior lung adenocarcinoma.  TTF-1 and Napsin A were negative. - Patient evaluated by Dr. Dayton Scrape at [facility] for pancreatic resection. - CA 19-9 (10/02/2021): 6.47 - PET CT scan (10/02/2021): Ill-defined 4 cm mass in the left upper quadrant in the region of the  pancreatic tail with mild hypermetabolism, SUV 4.7.  Soft tissue nodule in the gastrohepatic ligament measuring 2 x 1.3 cm, SUV 4.0.  Suspicious for metastatic lymphadenopathy.  9 mm mediastinal lymph node and AP window is hypermetabolic SUV 4.9.  Borderline enlarged left hilar lymph nodes show hypermetabolic activity SUV 3.4.  Previously seen subpleural nodule in the superior right lower lobe has nearly completely resolved.  Overall mild hypermetabolic mediastinal and left hilar lymph nodes which are nonspecific.  May be reactive in etiology although metastatic disease cannot definitely be excluded.        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 -Last EGD/colonoscopy on 05/15/2015 was normal. - CT scan in October 2018 was negative for metastatic disease. 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  Margins negative.  2 hilar lymph nodes were negative. - No adjuvant chemotherapy was needed.     Social/family history: - Lives at home with his wife.  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  No chemical exposure.  Quit smoking in 2014 and smoked 1 pack/day for 44 years. - Paternal cousin has bone cancer.    PLAN:   Stage IIB adenocarcinoma of the pancreatic tail: - Last chemotherapy for pancreatic cancer was on 12/30/2021. - PET scan from 04/22/2022: New foci of hypermetabolic activity in the right humeral shaft, left iliac lesion, right iliac lesion, proximal left diaphyseal lesion. - We reviewed biopsy results from the right iliac bone on 05/10/2022: Metastatic adenocarcinoma consistent with lung primary.  Tumor cells are positive for CK7, TTF-1, Napsin A, negative for CDX2 and CK20. - This will preclude him from having surgery for his pancreatic cancer. - He reports that left mid femur hurts for the last 1 week.  Will obtain x-ray 2 views as he had positive finding on the PET scan. - We will likely taper him off of TPN because of infection risk and the fact that he is no longer a surgical candidate for his pancreatic cancer. - We discussed the pathology report in detail.  We have ordered NGS testing on the biopsy specimen.  We will see him back in 2 weeks for follow-up to discuss further treatment options.   2.  Normocytic anemia: - Last hemoglobin was 10.2 on 05/10/2022.  No indication for parenteral iron therapy.   3.  Chronic diarrhea: - Continue Imodium and Lomotil as needed.   4.  Weight loss: - He has gained another 11 pounds since last visit.  He is continuing TPN via port. - Since there is no surgical option for his pancreatic cancer due to the new diagnosis of metastatic lung cancer, I have recommended decreasing TPN to nighttime for a week and then stop it.  In the interim he will slowly increase his p.o. intake to his pre-TPN at baseline.  We will send orders to his nutrition company.     Orders placed this encounter:  Orders Placed This Encounter   Procedures   DG FEMUR MIN 2 VIEWS LEFT       Derek Jack, MD [place] [phone]

## 2022-05-21 ENCOUNTER — Inpatient Hospital Stay: Payer: Medicare Other | Admitting: Internal Medicine

## 2022-05-24 ENCOUNTER — Ambulatory Visit: Payer: Medicare Other | Admitting: Hematology

## 2022-05-24 ENCOUNTER — Ambulatory Visit (HOSPITAL_COMMUNITY)
Admission: RE | Admit: 2022-05-24 | Discharge: 2022-05-24 | Disposition: A | Discharge status: Home / Self Care | Payer: Medicare Other | Source: Ambulatory Visit | Attending: Hematology | Admitting: Hematology

## 2022-05-24 ENCOUNTER — Other Ambulatory Visit: Payer: Self-pay | Admitting: *Deleted

## 2022-05-24 ENCOUNTER — Other Ambulatory Visit: Payer: Self-pay

## 2022-05-24 ENCOUNTER — Encounter: Payer: Self-pay | Admitting: *Deleted

## 2022-05-24 DIAGNOSIS — M79605 Pain in left leg: Secondary | ICD-10-CM | POA: Diagnosis not present

## 2022-05-24 DIAGNOSIS — C7951 Secondary malignant neoplasm of bone: Secondary | ICD-10-CM

## 2022-05-24 DIAGNOSIS — C3492 Malignant neoplasm of unspecified part of left bronchus or lung: Secondary | ICD-10-CM

## 2022-05-24 MED ORDER — HYDROCODONE-ACETAMINOPHEN 10-325 MG PO TABS: 1.0000 | ORAL_TABLET | Freq: Four times a day (QID) | ORAL | 0 refills | Status: DC | PRN

## 2022-05-24 NOTE — Progress Notes (Signed)
Pt presents to clinic to day after LLE [place] to r/o DVT. Exam was negative for DVT and pt informed. Instructed per MD to order MRI thoracic and lumbar spines with and without contrast to evaluate further pt c/o LLE pain radiating from upper thigh to left foot. Pt informed of this and is agreeable to have MRIs done. Pt reports that he is taking his hydrocodone 5/325 mg every 8 hours as prescribed with no relief of his pain. Instructed per MD to double up on his pain medication and take 2 pills (10/650) every 8 hours to see if this helps with the pain. The pt knows to call the clinic if this does not help control his pain. A new hyrdocodone Rx was routed to MD.

## 2022-05-24 NOTE — Progress Notes (Signed)
Order placed for LLE [place] STAT per Dr. Delton Coombes

## 2022-05-26 ENCOUNTER — Other Ambulatory Visit: Payer: Self-pay | Admitting: Hematology

## 2022-05-27 ENCOUNTER — Encounter: Payer: Self-pay | Admitting: Hematology

## 2022-05-28 ENCOUNTER — Other Ambulatory Visit: Payer: Self-pay | Admitting: Hematology

## 2022-05-28 ENCOUNTER — Ambulatory Visit: Payer: Medicare Other | Admitting: Internal Medicine

## 2022-05-29 ENCOUNTER — Encounter (HOSPITAL_COMMUNITY): Payer: Self-pay

## 2022-05-29 ENCOUNTER — Ambulatory Visit (HOSPITAL_COMMUNITY)
Admission: RE | Admit: 2022-05-29 | Discharge: 2022-05-29 | Disposition: A | Discharge status: Home / Self Care | Payer: Medicare Other | Source: Ambulatory Visit | Attending: Hematology | Admitting: Hematology

## 2022-05-29 ENCOUNTER — Other Ambulatory Visit: Payer: Self-pay | Admitting: Hematology

## 2022-05-29 DIAGNOSIS — C7951 Secondary malignant neoplasm of bone: Secondary | ICD-10-CM

## 2022-05-29 DIAGNOSIS — C3492 Malignant neoplasm of unspecified part of left bronchus or lung: Secondary | ICD-10-CM

## 2022-05-29 MED ORDER — GADOBUTROL 1 MMOL/ML IV SOLN
6.5000 mL | Freq: Once | INTRAVENOUS | Status: AC | PRN
  Administered 2022-05-29: 6.5 mL via INTRAVENOUS

## 2022-05-30 ENCOUNTER — Other Ambulatory Visit: Payer: Self-pay | Admitting: Hematology

## 2022-06-06 NOTE — Progress Notes (Signed)
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  No lymphadenopathy. - EUS and FNA (09/21/2021): Oval mass in the pancreatic tail, hypoechoic, 20 mm x 17 mm.  Remainder of the pancreas shows decreased visualization of the pancreas. - Pathology: Invasive adenocarcinoma, ductal type, moderately to poorly differentiated.  Background desmoplastic stroma, blood and pancreatic tissue.  Morphologically distinct from prior lung adenocarcinoma.  TTF-1 and Napsin A were negative. - Patient evaluated by Dr. Dayton Scrape at [facility] for pancreatic resection. - CA 19-9 (10/02/2021): 6.47 - PET CT scan (10/02/2021): Ill-defined 4 cm mass in the left upper quadrant in the region of the pancreatic tail with mild hypermetabolism, SUV 4.7.  Soft tissue nodule in the gastrohepatic ligament measuring 2 x 1.3 cm, SUV 4.0.  Suspicious for metastatic lymphadenopathy.  9 mm mediastinal lymph node and AP window is hypermetabolic SUV 4.9.  Borderline enlarged left hilar lymph nodes show hypermetabolic activity SUV 3.4.  Previously seen subpleural nodule in the superior right lower lobe has nearly completely resolved.  Overall mild hypermetabolic mediastinal and left hilar lymph nodes which are nonspecific.  May be reactive in etiology although metastatic disease cannot definitely be excluded.        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 -Last EGD/colonoscopy on 05/15/2015 was normal. - CT scan in October 2018 was negative for metastatic disease. 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  Margins negative.  2 hilar lymph nodes were negative. - No adjuvant chemotherapy was needed.    Social/family history: - Lives at home with his wife.  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  No chemical exposure.  Quit smoking in 2014 and smoked 1 pack/day for 44 years. - Paternal cousin has bone cancer.  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  Tumor cells are positive for CK7, TTF-1, Napsin A and negative for CDX2 and CK20. - He has reported pain shooting down the left lower extremity. - We reviewed MRI's of the spine from 05/29/2022: Numerous enhancing lesions throughout the spine and pelvis with no epidural tumor or pathologic fracture.  1.2 cm mass in the right posterior paraspinal musculature at L4.  Disc protrusion at L5-S1 with left S1 nerve root impingement.  This is most likely cause of patient's pain. - Recommend prednisone 50 mg daily for 5 days. - Recommend gabapentin dose increased to 300 mg 3 times daily.  He will receive Toradol 60 mg injection today.  He is taking hydrocodone 10/325 which  is not helping.  He is staying in bed 90% of the time per wife. - His NGS panel is pending at this time. - We will send prescription for Dilaudid as this worked in the past. - He does not want to have any  chemotherapy but is open to the immunotherapy and other targeted treatments.  Will discuss after the NGS panel is reported next week.    2.  Normocytic anemia: - Last hemoglobin was 10.2.  No indication for parenteral iron therapy.   3.  Chronic diarrhea: - Continue Imodium and Lomotil as needed.   4.  Weight loss: - He has gained close to 15 pounds while he was on TPN. - Last TPN on 05/28/2022. - He lost 7 pounds since last visit.  He is eating 1 meal per day.  He is on Megace 10 mL twice daily.  No orders of the defined types were placed in this encounter.     I,Alexis Herring,acting as a Education administrator for [employer], MD.,have documented all relevant documentation on the behalf of Derek Jack, MD,as directed by  Derek Jack, MD while in the presence of Derek Jack, MD.   I, Derek Jack MD, have reviewed the above documentation for accuracy and completeness, and I agree with the above.   Derek Jack, MD   3/18/20246:16 PM  CHIEF COMPLAINT:   Diagnosis: stage IIb adenocarcinoma of the pancreatic tail    Cancer Staging  Adenocarcinoma of colon with mucinous features Staging form: Colon and Rectum, AJCC 7th Edition - Clinical: Stage IIIB (T3, N1, M0) - Signed by Baird Cancer, PA on 09/22/2010  Adenocarcinoma of left lung [facility]) Staging form: Lung, AJCC 8th Edition - Clinical stage from 05/18/2022: Stage IVB (rcTX, cN0, cM1c) - Signed by Derek Jack, MD on 05/18/2022  Pancreatic cancer [facility]) Staging form: Exocrine Pancreas, AJCC 8th Edition - Clinical stage from 10/07/2021: Stage IIB (cT1, cN1, cM0) - Unsigned    Prior Therapy: FOLFIRINOX  Current Therapy: Under workup   HISTORY OF PRESENT ILLNESS:   Oncology History  Pancreatic cancer ([place])  10/07/2021 Initial Diagnosis   Pancreatic cancer ([place])   10/13/2021 - 11/19/2021 Chemotherapy   Patient is on Treatment Plan : PANCREAS Modified FOLFIRINOX q14d x 4 cycles      10/13/2021 -  Chemotherapy   Patient is on Treatment Plan : PANCREAS Modified FOLFIRINOX q14d x 4 cycles     Adenocarcinoma of left lung ([place])  05/18/2022 Initial Diagnosis   Adenocarcinoma of left lung ([place])   05/18/2022 Cancer Staging   Staging form: Lung, AJCC 8th Edition - Clinical stage from 05/18/2022: Stage IVB (rcTX, cN0, cM1c) - Signed by Derek Jack, MD on 05/18/2022 Histopathologic type: Adenocarcinoma, NOS Stage prefix: Recurrence      INTERVAL HISTORY:   Scout is a 72 y.o. male presenting to clinic today for follow up of stage IIb adenocarcinoma of the pancreatic tail. He was last seen by me on 05/18/22.  Today, he states that he is doing well overall. His appetite level is at 25%. His energy level is at 25%. He reports left leg pain shooting down to the foot.   PAST MEDICAL HISTORY:   Past Medical History: 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   Stage 3   Anemia    Anxiety    Arthritis    Barrett's esophagus    Blood transfusion    Bowel obstruction ([place]) 05/13/2012   Recurrent  Bronchitis    Chest pain at rest    Chronic abdominal pain    Erosive esophagitis    ETOH abuse    quit 03/2010   GERD (gastroesophageal reflux disease)    Hx of Clostridium difficile infection 01/2012   Hypertension    Ileus ([place])    Iron deficiency anemia 03/23/2016   Lung cancer ([place])    Obstruction of bowel ([place]) 03/03/2014   Osteoporosis    Pancreatic cancer ([facility])    Personal history of PE (pulmonary embolism) 10/01/2010   Pneumonia    Pulmonary embolism ([place]) 02/2010   Recurrent upper respiratory infection (URI)    Renal disorder    S/P endoscopy 09/28/2010   erosive reflux esophagitis, Billroth I anatomy   S/P partial gastrectomy 1980s   Seizures ([place])    was on meds for 6 months, Unknown etiology, last seizure was 2017.   Shortness of breath    TIA (transient ischemic attack) 12/2009   Vitamin  B12 deficiency     Surgical History: Past Surgical History:  Procedure Laterality Date   ABDOMINAL ADHESION SURGERY  03/04/15   @ [facility]   ABDOMINAL EXPLORATION SURGERY     abdominal sugery     for bowel obstruction x 8, all in 1980s, except for one in 07/2010   APPENDECTOMY  1980s   Billroth 1 hemigastrectomy  1980s   per patient for benign duodenal tumor   BIOPSY  01/10/2022   Procedure: BIOPSY;  Surgeon: Eloise Harman, DO;  [facility];  Service: Endoscopy;;  gastric,ileum   CARDIAC CATHETERIZATION  07/17/2012   CHOLECYSTECTOMY  1980s   COLON SURGERY  May 2012   left hemicolectomy, colon cancer found at time of surgery for bowel obstruction   COLON SURGERY  10/12/2018   At [facility]: Resection of right colon and terminal ileum with creation of ileorectal anastomosis for large bowel obstruction.  Cecum and ascending colon noted to be attached to the rectum.   COLONOSCOPY  03/18/2011   anastomosis at 35cm. Several adenomatous polyps removed. Sigmoid diverticulosis. Next TCS 02/2013   COLONOSCOPY N/A 07/24/2012   [id number] post segmental resection with normal-appearing colonic anastomosis aside from an adjacent polyp-removed as described above. Rectal polyp-removed as described above. CT findings appear to have been artifactual. tubular adenomas/prolapsed type polyp.   COLONOSCOPY N/A 05/15/2015   Procedure: COLONOSCOPY;  Surgeon: [name], MD;  [facility];  Service: Endoscopy;  Laterality: N/A;   COLONOSCOPY  09/26/2018   at [facility]: prep not adequate for colon cancer surveillance.  Prior end-to-end colonic anastomosis in the rectosigmoid region.  This was patent and characterized by healthy-appearing mucosa.  Anastomosis was traversed.  Normal terminal ileum.   COLONOSCOPY WITH PROPOFOL N/A 11/04/2016   Dr. [name], status post subtotal colectomy with normal-appearing residual lower GI tract.  Next colonoscopy in 5 years.   ENTEROSCOPY N/A 04/07/2022   Procedure:  ENTEROSCOPY;  Surgeon: [name], MD;  [facility];  Service: Endoscopy;  Laterality: N/A;   ESOPHAGOGASTRODUODENOSCOPY  09/28/2010   ESOPHAGOGASTRODUODENOSCOPY  12/01/2010   Cervical web status post dilation, erosive esophagitis, B1 hemigastrectomy, inflamed anastomosis   ESOPHAGOGASTRODUODENOSCOPY  04/16/2011   excoriation at [facility] c/w trauma/M-W tear, friable gastric anastomosis, dilation efferent limb   ESOPHAGOGASTRODUODENOSCOPY N/A 06/03/2014   Dr.[name]- cervcal esopphageal web s/p dilation. abnormal distal esophagus bx= barretts esophagus   ESOPHAGOGASTRODUODENOSCOPY N/A 05/15/2015   Procedure: ESOPHAGOGASTRODUODENOSCOPY (EGD);  Surgeon: [name], MD;  [facility];  Service: Endoscopy;  Laterality: N/A;  230   ESOPHAGOGASTRODUODENOSCOPY (EGD) WITH ESOPHAGEAL DILATION  02/25/2012   [id number] esophageal web-s/p dilation anddisruption as described above. Status post prior gastric with Billroth I configuration. Abnormal gastric mucosa at the anastomosis. Gastric biopsy showed mild chronic inflammation but no H. pylori    ESOPHAGOGASTRODUODENOSCOPY (EGD) WITH PROPOFOL N/A 11/04/2016   Dr. [name]: Reflux esophagitis, small hiatal hernia status post hemigastrectomy.  Single patent efferent small bowel limb appeared normal, 2 x 2 centimeter tongue of salmon epithelium again seen, esophageal dilation.  Biopsies consistent with reflux changes, not Barrett's however this was confirmed on prior EGDs.  Offer 3-year follow-up EGD August 2021.   ESOPHAGOGASTRODUODENOSCOPY (EGD) WITH PROPOFOL N/A 03/03/2020   Procedure: ESOPHAGOGASTRODUODENOSCOPY (EGD) WITH PROPOFOL;  Surgeon: [name], MD;  [facility];  Service: Endoscopy;  Laterality: N/A;  3:00pm   ESOPHAGOGASTRODUODENOSCOPY (EGD) WITH PROPOFOL N/A 01/10/2022   Procedure: ESOPHAGOGASTRODUODENOSCOPY (EGD) WITH PROPOFOL;  Surgeon: Eloise Harman, DO;  [facility];  Service: Endoscopy;   Laterality: N/A;   ESOPHAGOGASTRODUODENOSCOPY (EGD) WITH PROPOFOL N/A 04/07/2022   Procedure: ESOPHAGOGASTRODUODENOSCOPY (EGD) WITH PROPOFOL;  Surgeon: [name], MD;  [facility];  Service: Endoscopy;  Laterality: N/A;   FLEXIBLE SIGMOIDOSCOPY N/A 01/10/2022   Procedure: FLEXIBLE SIGMOIDOSCOPY;  Surgeon: Eloise Harman, DO;  [facility];  Service: Endoscopy;  Laterality: N/A;   HERNIA REPAIR     right inguinal   HOT HEMOSTASIS  04/07/2022   Procedure: HOT HEMOSTASIS (ARGON PLASMA COAGULATION/BICAP);  Surgeon: [name], MD;  [facility];  Service: Endoscopy;;   IR IMAGING GUIDED PORT INSERTION  05/10/2022   MALONEY DILATION N/A 06/03/2014   Procedure: [name] DILATION;  Surgeon: [name], MD;  [facility];  Service: Endoscopy;  Laterality: N/A;   MALONEY DILATION N/A 05/15/2015   Procedure: [name] DILATION;  Surgeon: [name], MD;  [facility];  Service: Endoscopy;  Laterality: N/A;   MALONEY DILATION N/A 11/04/2016   Procedure: [name] DILATION;  Surgeon: [name], MD;  [facility];  Service: Endoscopy;  Laterality: N/A;   MALONEY DILATION N/A 03/03/2020   Procedure: [name] DILATION;  Surgeon: [name], MD;  [facility];  Service: Endoscopy;  Laterality: N/A;   PORT-A-CATH REMOVAL Left 04/08/2022   Procedure: MINOR REMOVAL PORT-A-CATH;  Surgeon: Rusty Aus, DO;  Location: [facility];  Service: General;  Laterality: Left;   PORTACATH PLACEMENT     SAVORY DILATION N/A 06/03/2014   Procedure: SAVORY DILATION;  Surgeon: [name], MD;  [facility];  Service: Endoscopy;  Laterality: N/A;   SUBMUCOSAL INJECTION  04/07/2022   Procedure: SUBMUCOSAL INJECTION;  Surgeon: [name], MD;  [facility];  Service: Endoscopy;;   SUBMUCOSAL TATTOO INJECTION  04/07/2022   Procedure: SUBMUCOSAL TATTOO INJECTION;  Surgeon: [name], MD;   [facility];  Service: Endoscopy;;   TEE WITHOUT CARDIOVERSION N/A 04/09/2022   Procedure: TRANSESOPHAGEAL ECHOCARDIOGRAM (TEE);  Surgeon: Arnoldo Lenis, MD;  Location: [facility];  Service: Endoscopy;  Laterality: N/A;    Social History: Social History   Socioeconomic History   Marital status: Married    Spouse name: Not on file   Number of children: 3   Years of education: Not on file   Highest education level: Not on file  Occupational History    Employer: [employer]  Tobacco Use   Smoking status: Former    Packs/day: 0.50    Years: 40.00    Additional pack years: 0.00    Total pack years: 20.00    Types: Cigarettes    Quit date: 12/20/2012    Years since quitting: 9.4   Smokeless tobacco: Never  Vaping Use   Vaping Use: Never used  Substance and Sexual Activity   Alcohol use: No   Drug use: No   Sexual activity: Not Currently  Other Topics Concern   Not on file  Social History Narrative   Not on file   Social Determinants of Health   Financial Resource Strain: Not on file  Food Insecurity: No Food Insecurity (04/03/2022)   Hunger Vital Sign    Worried About Running Out of Food in the Last Year: Never true    Ran Out of Food in the Last Year: Never true  Transportation Needs: No Transportation Needs (04/03/2022)   PRAPARE - Hydrologist (Medical): No    Lack of Transportation (Non-Medical): No  Physical Activity: Not on file  Stress: Not on file  Social Connections: Not on file  Intimate Partner Violence: Not At Risk (04/03/2022)   Humiliation, Afraid, Rape, and Kick questionnaire    Fear of Current or Ex-Partner: No    Emotionally Abused: No    Physically Abused: No    Sexually Abused: No    Family History: Family History  Problem Relation Age of Onset   Hypertension Mother    Arthritis Mother    Pneumonia Mother    Hypertension Father    Heart attack Father    Bone cancer Cousin    Colon cancer Neg Hx      Current Medications:  Current Outpatient Medications:    atorvastatin (LIPITOR) 20 MG tablet, Take 1 tablet (20 mg total) by mouth daily., Disp: , Rfl:    Bacillus Coagulans-Inulin (PROBIOTIC) 1-250 BILLION-MG CAPS, Take 1 capsule by mouth daily., Disp: , Rfl:    bacitracin ointment, Apply topically daily., Disp: 120 g, Rfl: 0   chlorproMAZINE (THORAZINE) 25 MG tablet, Take 1 tablet (25 mg total) by mouth every 6 (six) hours as needed. (Patient taking differently: Take 25 mg by mouth every 6 (six) hours as needed for nausea or vomiting.), Disp: 30 tablet, Rfl: 0   Cholecalciferol (VITAMIN D) 2000 units CAPS, Take 2,000 Units by mouth daily., Disp: , Rfl:    CREON 36000-114000 units CPEP capsule, Take 72,000-108,000 Units by mouth See admin instructions. Take 3 capsules (108,000 units) by mouth with meals and take 2 capsules (72,000 units) with snacks., Disp: , Rfl:    cyanocobalamin 100 MCG tablet, Take by mouth., Disp: , Rfl:    DAPTOmycin (CUBICIN) 500 MG injection, , Disp: , Rfl:    diclofenac Sodium (VOLTAREN) 1 % GEL, Apply 4 g topically 4 (four) times daily., Disp: , Rfl:    dicyclomine (BENTYL) 10 MG capsule, Take 10 mg by mouth 2 (two) times daily before a meal., Disp: , Rfl:    diphenoxylate-atropine (LOMOTIL) 2.5-0.025 MG tablet, Take 1 tablet by mouth 4 times daily, Disp: 120 tablet, Rfl: 1   ELIQUIS 5 MG TABS tablet, TAKE 1 TABLET BY MOUTH TWICE A DAY, Disp: 60 tablet, Rfl: 6   escitalopram (LEXAPRO) 10 MG tablet, TAKE 1 TABLET BY MOUTH EVERY DAY, Disp: 30 tablet, Rfl: 1   escitalopram (LEXAPRO) 20 MG tablet, TAKE 1 TABLET BY MOUTH EVERY DAY, Disp: 90 tablet, Rfl:  0   feeding supplement (ENSURE ENLIVE / ENSURE PLUS) LIQD, Take 237 mLs by mouth 2 (two) times daily between meals., Disp: , Rfl:    ferrous sulfate 325 (65 FE) MG EC tablet, Take by mouth., Disp: , Rfl:    HYDROcodone-acetaminophen (NORCO) 10-325 MG tablet, Take 1 tablet by mouth every 6 (six) hours as needed., Disp:  90 tablet, Rfl: 0   hydrocortisone cream 1 %, Apply 1 Application topically 2 (two) times daily., Disp: , Rfl:    magnesium oxide (MAG-OX) 400 (240 Mg) MG tablet, TAKE 1 TABLET BY MOUTH TWICE A DAY, Disp: 180 tablet, Rfl: 3   megestrol (MEGACE) 40 MG/ML suspension, TAKE 10 MLS (400 MG TOTAL) BY MOUTH 2 (TWO) TIMES DAILY., Disp: 480 mL, Rfl: 1   mirtazapine (REMERON) 30 MG tablet, TAKE 1 TABLET BY MOUTH AT BEDTIME, Disp: 30 tablet, Rfl: 3   Multiple Vitamin (INFUVITE ADULT IV), 5 mLs See admin instructions. Inject 5 mls from each vial #1 and #2, Disp: , Rfl:    Multiple Vitamin (MULTIVITAMIN WITH MINERALS) TABS tablet, Take 1 tablet by mouth daily., Disp: , Rfl:    ondansetron (ZOFRAN-ODT) 4 MG disintegrating tablet, TAKE 1 TABLET BY MOUTH EVERY 4 HOURS AS NEEDED FOR NAUSEA AND VOMITING, Disp: 30 tablet, Rfl: 1   pantoprazole (PROTONIX) 40 MG tablet, Take 1 tablet (40 mg total) by mouth 2 (two) times daily before a meal., Disp: 180 tablet, Rfl: 3   predniSONE (DELTASONE) 50 MG tablet, Take 1 tablet (50 mg total) by mouth daily with breakfast., Disp: 5 tablet, Rfl: 0   PREVALITE 4 g packet, Take 1 packet (4 g total) by mouth daily. DO NOT TAKE WITHIN 2 HOURS OF OTHER MEDICATIONS, Disp: 90 packet, Rfl: 3   prochlorperazine (COMPAZINE) 10 MG tablet, Take 10 mg by mouth every 6 (six) hours as needed for vomiting or nausea., Disp: , Rfl:    sterile water SOLN with Travasol 10 % SOLN 50 g/L, dextrose 70 % SOLN 20 %, sodium INJ 35 mEq/L, potassium INJ 30 mEq/L, calcium GLUCONATE INJ 4.7 mEq/L, magnesium SULFATE INJ 5 mEq/L, phosphorus INJ 15 mmol/L, M.V.I. Adult INJ 10 mL, trace elements Cr-Cu-Mn-Se-Zn (MTE-5 Concentrate) 12-998-500-60 MCG/ML SOLN 1 mL, fat emulsion 30 % EMUL 100 mL/L, continuous., Disp: , Rfl:    sucralfate (CARAFATE) 1 GM/10ML suspension, TAKE 10 MLS (1 G TOTAL) BY MOUTH 4 (FOUR) TIMES DAILY AS NEEDED. FOR BREAKTHROUGH HEARTBURN (Patient taking differently: Take 1 g by mouth 4 (four) times  daily as needed (breakthrough heartburn).), Disp: 420 mL, Rfl: 5   traMADol (ULTRAM) 50 MG tablet, Take 1 tablet by mouth every 6 (six) hours as needed for moderate pain or severe pain., Disp: , Rfl:    gabapentin (NEURONTIN) 300 MG capsule, Take 1 capsule (300 mg total) by mouth 3 (three) times daily., Disp: 90 capsule, Rfl: 3 No current facility-administered medications for this visit.  Facility-Administered Medications Ordered in Other Visits:    acetaminophen (TYLENOL) 325 MG tablet, , , ,    heparin lock flush 100 unit/mL, 500 Units, Intravenous, Once, Kefalas, Thomas S, PA-C   loratadine (CLARITIN) 10 MG tablet, , , ,    sodium chloride flush (NS) 0.9 % injection 10 mL, 10 mL, Intravenous, PRN, Holley Bouche, NP, 10 mL at 11/05/16 1136   Allergies: No Known Allergies  REVIEW OF SYSTEMS:   Review of Systems  Constitutional:  Negative for chills, fatigue and fever.  HENT:   Negative for lump/mass, mouth  sores, nosebleeds, sore throat and trouble swallowing.   Eyes:  Negative for eye problems.  Respiratory:  Negative for cough and shortness of breath.   Cardiovascular:  Negative for chest pain, leg swelling and palpitations.  Gastrointestinal:  Positive for diarrhea and nausea. Negative for abdominal pain, constipation and vomiting.  Genitourinary:  Negative for bladder incontinence, difficulty urinating, dysuria, frequency, hematuria and nocturia.   Musculoskeletal:  Positive for myalgias. Negative for arthralgias, back pain, flank pain and neck pain.  Skin:  Negative for itching and rash.  Neurological:  Positive for numbness. Negative for dizziness and headaches.  Hematological:  Does not bruise/bleed easily.  Psychiatric/Behavioral:  Negative for depression, sleep disturbance and suicidal ideas. The patient is not nervous/anxious.   All other systems reviewed and are negative.    VITALS:   Blood pressure (!) 152/96, pulse (!) 130, temperature 97.9 F (36.6 C),  temperature source Oral, resp. rate 20, height 5\' 8"  (1.727 m), weight 134 lb 11.2 oz (61.1 kg), SpO2 97 %.  Wt Readings from Last 3 Encounters:  06/07/22 134 lb 11.2 oz (61.1 kg)  05/18/22 142 lb (64.4 kg)  05/10/22 132 lb (59.9 kg)    Body mass index is 20.48 kg/m.  Performance status (ECOG): 1 - Symptomatic but completely ambulatory  PHYSICAL EXAM:   Physical Exam Vitals and nursing note reviewed. Exam conducted with a chaperone present.  Constitutional:      Appearance: Normal appearance.  Cardiovascular:     Rate and Rhythm: Normal rate and regular rhythm.     Pulses: Normal pulses.     Heart sounds: Normal heart sounds.  Pulmonary:     Effort: Pulmonary effort is normal.     Breath sounds: Normal breath sounds.  Abdominal:     Palpations: Abdomen is soft. There is no hepatomegaly, splenomegaly or mass.     Tenderness: There is no abdominal tenderness.  Musculoskeletal:     Right lower leg: No edema.     Left lower leg: No edema.  Lymphadenopathy:     Cervical: No cervical adenopathy.     Right cervical: No superficial, deep or posterior cervical adenopathy.    Left cervical: No superficial, deep or posterior cervical adenopathy.     Upper Body:     Right upper body: No supraclavicular or axillary adenopathy.     Left upper body: No supraclavicular or axillary adenopathy.  Neurological:     General: No focal deficit present.     Mental Status: He is alert and oriented to person, place, and time.  Psychiatric:        Mood and Affect: Mood normal.        Behavior: Behavior normal.     LABS:      Latest Ref Rng & Units 05/10/2022    9:13 AM 04/19/2022    1:58 PM 04/12/2022    7:46 AM  CBC  WBC 4.0 - 10.5 K/uL 7.6  9.1  6.5   Hemoglobin 13.0 - 17.0 g/dL 10.2  9.8  8.3   Hematocrit 39.0 - 52.0 % 30.8  31.1  26.5   Platelets 150 - 400 K/uL 153  223  210       Latest Ref Rng & Units 04/19/2022    1:58 PM 04/12/2022    4:43 AM 04/11/2022    4:54 AM  CMP   Glucose 70 - 99 mg/dL 120  118  92   BUN 8 - 23 mg/dL 21  11  13  Creatinine 0.61 - 1.24 mg/dL 1.20  1.16  1.14   Sodium 135 - 145 mmol/L 133  137  138   Potassium 3.5 - 5.1 mmol/L 5.1  4.4  4.3   Chloride 98 - 111 mmol/L 108  115  114   CO2 22 - 32 mmol/L 18  20  20    Calcium 8.9 - 10.3 mg/dL 7.7  7.6  7.5   Total Protein 6.5 - 8.1 g/dL 6.8  5.2  5.0   Total Bilirubin 0.3 - 1.2 mg/dL 0.2  0.3  0.3   Alkaline Phos 38 - 126 U/L 100  80  74   AST 15 - 41 U/L 39  20  22   ALT 0 - 44 U/L 40  30  32      Lab Results  Component Value Date   CEA1 3.4 12/30/2021   CEA 2.9 04/30/2016   /  CEA  Date Value Ref Range Status  12/30/2021 3.4 0.0 - 4.7 ng/mL Final    Comment:    (NOTE)                             Nonsmokers          <3.9                             Smokers             <5.6 [employer] Electrochemiluminescence Immunoassay (ECLIA) Values obtained with different assay methods or kits cannot be used interchangeably.  Results cannot be interpreted as absolute evidence of the presence or absence of malignant disease. Performed At: [facility] [address] [place], [place] [id number] Rush Farmer MD [id number]   04/30/2016 2.9 0.0 - 4.7 ng/mL Final    Comment:    (NOTE)       Roche ECLIA methodology       Nonsmokers  <3.9                                     Smokers     <5.6 Performed At: [facility] [place], [place] [id number] Lindon Romp MD [id number]    No results found for: "PSA1" Lab Results  Component Value Date   CAN199 <2 12/30/2021   No results found for: "CAN125"  No results found for: "TOTALPROTELP", "ALBUMINELP", "A1GS", "A2GS", "BETS", "BETA2SER", "GAMS", "MSPIKE", "SPEI" Lab Results  Component Value Date   TIBC 354 04/19/2022   TIBC 198 (L) 03/24/2022   TIBC 138 (L) 03/04/2022   FERRITIN 536 (H) 04/19/2022   FERRITIN 519 (H) 03/04/2022   FERRITIN 979 (H) 01/09/2022   IRONPCTSAT 31  04/19/2022   IRONPCTSAT 50 (H) 03/24/2022   IRONPCTSAT 48 (H) 03/04/2022   Lab Results  Component Value Date   LDH 151 11/22/2019   LDH 109 08/16/2018   LDH 146 04/30/2016     STUDIES:   MR TOTAL SPINE METS SCREENING  Result Date: 05/29/2022 CLINICAL DATA:  Bone metastases. Left lower extremity pain radiating to the foot. EXAM: MRI TOTAL SPINE WITHOUT AND WITH CONTRAST TECHNIQUE: Multisequence MR imaging of the spine from the cervical spine to the sacrum was performed prior to and following IV contrast administration for evaluation of spinal metastatic disease. CONTRAST:  6.77mL GADAVIST GADOBUTROL 1 MMOL/ML IV SOLN  COMPARISON:  PET-CT 04/22/2022.  Chest CTA 09/24/2017. FINDINGS: MRI CERVICAL SPINE FINDINGS Alignment: No significant listhesis. Vertebrae: 1 cm T1 hypointense, STIR hyperintense, enhancing lesion in the upper right lateral C2 vertebral body. Few possible additional scattered subcentimeter lesions elsewhere, for example a 4 mm focus in the C7 vertebral body to the left of midline. No evidence of epidural tumor or fracture. Cord: No cord signal abnormality or abnormal intradural enhancement identified. Posterior fossa and paraspinal tissues: Grossly unremarkable. Disc levels: Mild diffuse cervical spondylosis and right greater than left facet hypertrophy, not evaluated on detail on this study which does not include axial images through the cervical spine. No evidence of high-grade spinal canal stenosis. MRI THORACIC SPINE FINDINGS Alignment: Exaggerated upper thoracic kyphosis. Mild upper thoracic dextroscoliosis. No significant listhesis. Vertebrae: Chronic, moderate T8 superior endplate compression fracture, unchanged from 2019. No acute fracture. Likely hemangioma in the T7 vertebral body to the right of midline. T1 hypointense, STIR hyperintense, enhancing lesions in the T7, T9, T10, and T11 vertebral bodies, with the largest lesions being in the left half of the T11 vertebral body  extending into the left pedicle (measuring approximately 2 cm each). Lesions in the posterior left sixth, eighth, and ninth ribs. No evidence of epidural tumor. Cord: No cord signal abnormality or abnormal intradural enhancement identified. Paraspinal and other soft tissues: Grossly unremarkable. Disc levels: No evidence of significant disc degeneration for age or significant spinal stenosis. 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 Alignment:  Normal. Vertebrae: Scattered small enhancing lesions throughout the vertebral bodies and posterior elements of the lumbar spine and in the sacrum and posterior ilia, including a 1.5 cm lesion anteriorly in the L5 vertebral body, a 1.6 cm lesion to the right of midline at S2, and a partially visualized lesion in the posterior right ilium measuring greater than 2 cm. No evidence of epidural tumor or fracture. Conus medullaris: Extends to the lower L2 level and appears normal. Paraspinal and other soft tissues: 1.2 cm presumably enhancing mass in the right lateral posterior paraspinal musculature at L4 without a corresponding abnormality seen on the prior PET-CT. Right renal cysts for which no follow-up imaging is recommended. Disc levels: L1-2 through L3-4: No disc herniation or stenosis. L4-5: Disc bulging and mild-to-moderate right and mild left facet and ligamentum flavum hypertrophy without significant stenosis. L5-S1: Disc bulging, a left subarticular disc protrusion, and mild facet hypertrophy result in mild right and moderate to severe left lateral recess stenosis with posterior displacement of the left S1 nerve root. Mild bilateral neural foraminal stenosis. No significant spinal stenosis. IMPRESSION: 1. Numerous enhancing lesions throughout the spine and pelvis consistent with metastases in progressed in number and size from the 04/22/2022 PET-CT. No epidural tumor or pathologic fracture. 2. 1.2 cm mass in  the right posterior paraspinal musculature at L4, suspicious for a metastasis and new from the prior PET-CT. 3. Disc protrusion at L5-S1 resulting in moderate to severe left lateral recess stenosis and left S1 nerve root impingement. Electronically Signed   By: Logan Bores M.D.   On: 05/29/2022 17:24   US Venous Img Lower Unilateral Left  Result Date: 05/24/2022 CLINICAL DATA:  Left lower extremity pain for 6 days EXAM: LEFT LOWER EXTREMITY VENOUS DOPPLER ULTRASOUND TECHNIQUE: Gray-scale sonography with graded compression, as well as color Doppler and duplex ultrasound were performed to evaluate the lower extremity deep venous systems from the level of the common femoral vein and including the common femoral, femoral, profunda  femoral, popliteal and calf veins including the posterior tibial, peroneal and gastrocnemius veins when visible. The superficial great saphenous vein was also interrogated. Spectral Doppler was utilized to evaluate flow at rest and with distal augmentation maneuvers in the common femoral, femoral and popliteal veins. COMPARISON:  None Available. FINDINGS: Contralateral Common Femoral Vein: Respiratory phasicity is normal and symmetric with the symptomatic side. No evidence of thrombus. Normal compressibility. Common Femoral Vein: No evidence of thrombus. Normal compressibility, respiratory phasicity and response to augmentation. Saphenofemoral Junction: No evidence of thrombus. Normal compressibility and flow on color Doppler imaging. Profunda Femoral Vein: No evidence of thrombus. Normal compressibility and flow on color Doppler imaging. Femoral Vein: No evidence of thrombus. Normal compressibility, respiratory phasicity and response to augmentation. Popliteal Vein: No evidence of thrombus. Normal compressibility, respiratory phasicity and response to augmentation. Calf Veins: No evidence of thrombus. Normal compressibility and flow on color Doppler imaging. Superficial Great Saphenous  Vein: No evidence of thrombus. Normal compressibility. IMPRESSION: No evidence of deep venous thrombosis. Electronically Signed   By: Jerilynn Mages.  Shick M.D.   On: 05/24/2022 14:39   DG FEMUR MIN 2 VIEWS LEFT  Result Date: 05/20/2022 CLINICAL DATA:  Pain EXAM: LEFT FEMUR 4 VIEWS COMPARISON:  None Available. FINDINGS: No fracture, dislocation or subluxation. No osteolytic or osteoblastic lesions. IMPRESSION: Negative. Electronically Signed   By: Sammie Bench M.D.   On: 05/20/2022 16:11   IR IMAGING GUIDED PORT INSERTION  Result Date: 05/10/2022 INDICATION: 72 year old with history of pancreatic cancer and concern for new metastatic disease in the bone. Patient currently has a right arm PICC line being used for TPN. Left chest port was recently removed for bacteremia. Patient needs a new Port-A-Cath. EXAM: FLUOROSCOPIC AND ULTRASOUND GUIDED PLACEMENT OF A SUBCUTANEOUS PORT COMPARISON:  None Available. MEDICATIONS: Moderate sedation ANESTHESIA/SEDATION: Moderate (conscious) sedation was employed during this procedure. A total of Versed 1 mg and fentanyl 50 mcg was administered intravenously at the order of the provider performing the procedure. Total intra-service moderate sedation time: 27 minutes. Patient's level of consciousness and vital signs were monitored continuously by radiology nurse throughout the procedure under the supervision of the provider performing the procedure. FLUOROSCOPY TIME:  Radiation Exposure Index (as provided by the fluoroscopic device): 2 mGy Kerma COMPLICATIONS: None immediate. PROCEDURE: The procedure, risks, benefits, and alternatives were explained to the patient. Questions regarding the procedure were encouraged and answered. The patient understands and consents to the procedure. Patient was placed supine on the interventional table. Ultrasound confirmed a patent right internal jugular vein. Ultrasound image was saved for documentation. The right chest and neck were cleaned with a  skin antiseptic and a sterile drape was placed. Maximal barrier sterile technique was utilized including caps, mask, sterile gowns, sterile gloves, sterile drape, hand hygiene and skin antiseptic. The right neck was anesthetized with 1% lidocaine. Small incision was made in the right neck with a blade. Micropuncture set was placed in the right internal jugular vein with ultrasound guidance. The micropuncture wire was used for measurement purposes. The right chest was anesthetized with 1% lidocaine with epinephrine. #15 blade was used to make an incision and a subcutaneous port pocket was formed. [place] was assembled. Subcutaneous tunnel was formed with a stiff tunneling device. The port catheter was brought through the subcutaneous tunnel. The port was placed in the subcutaneous pocket. The micropuncture set was exchanged for a peel-away sheath. The catheter was placed through the peel-away sheath and the tip was positioned at the superior  cavoatrial junction. Catheter placement was confirmed with fluoroscopy. The port was accessed and flushed with heparinized saline. The port pocket was closed using two layers of absorbable sutures and Dermabond. The vein skin site was closed using a single layer of absorbable suture and Dermabond. Sterile dressings were applied. Patient tolerated the procedure well without an immediate complication. Ultrasound and fluoroscopic images were taken and saved for this procedure. IMPRESSION: Placement of a subcutaneous power-injectable port device. Catheter tip at the superior cavoatrial junction. Electronically Signed   By: Markus Daft M.D.   On: 05/10/2022 13:29   CT Biopsy  Result Date: 05/10/2022 INDICATION: 72 year old with history of pancreatic cancer. Recent PET-CT demonstrates hypermetabolic bone lesions that are concerning for metastatic disease. Request for tissue sampling. EXAM: CT-GUIDED BIOPSY OF RIGHT ILIAC BONE LESION MEDICATIONS: Moderate sedation  ANESTHESIA/SEDATION: Moderate (conscious) sedation was employed during this procedure. A total of Versed 1 mg and Fentanyl 50 mcg was administered intravenously by the radiology nurse. Total intra-service moderate Sedation Time: 17 minutes. The patient's level of consciousness and vital signs were monitored continuously by radiology nursing throughout the procedure under my direct supervision. FLUOROSCOPY TIME:  None COMPLICATIONS: None immediate. PROCEDURE: Informed written consent was obtained from the patient after a thorough discussion of the procedural risks, benefits and alternatives. All questions were addressed. A timeout was performed prior to the initiation of the procedure. Patient was placed prone and CT images through the pelvis were obtained. Subtle sclerotic lesion along the posterior right ilium was identified and this corresponds with the hypermetabolic activity on recent PET-CT. The overlying skin was prepped with chlorhexidine and a sterile field was created. Skin was anesthetized using 1% lidocaine. Using CT guidance, an 11 gauge bone needle was directed into the sclerotic bone lesion. Three core biopsies were obtained from the lesion with the 11 gauge needle. Needle placement was confirmed with CT. Specimens placed in formalin. Bandage placed over the puncture site. RADIATION DOSE REDUCTION: This exam was performed according to the departmental dose-optimization program which includes automated exposure control, adjustment of the mA and/or kV according to patient size and/or use of iterative reconstruction technique. FINDINGS: Subtle sclerotic lesion in posterior right ilium corresponding with previous PET-CT findings. IMPRESSION: CT-guided biopsy of right iliac bone sclerotic lesion. Electronically Signed   By: Markus Daft M.D.   On: 05/10/2022 13:20

## 2022-06-07 ENCOUNTER — Inpatient Hospital Stay: Payer: Medicare Other | Attending: Hematology | Admitting: Hematology

## 2022-06-07 ENCOUNTER — Other Ambulatory Visit: Payer: Self-pay | Admitting: *Deleted

## 2022-06-07 ENCOUNTER — Inpatient Hospital Stay: Payer: Medicare Other

## 2022-06-07 VITALS — BP 152/96 | HR 130 | Temp 97.9°F | Resp 20 | Ht 68.0 in | Wt 134.7 lb

## 2022-06-07 DIAGNOSIS — C3490 Malignant neoplasm of unspecified part of unspecified bronchus or lung: Secondary | ICD-10-CM | POA: Diagnosis present

## 2022-06-07 DIAGNOSIS — C252 Malignant neoplasm of tail of pancreas: Secondary | ICD-10-CM | POA: Insufficient documentation

## 2022-06-07 DIAGNOSIS — C7951 Secondary malignant neoplasm of bone: Secondary | ICD-10-CM | POA: Diagnosis not present

## 2022-06-07 DIAGNOSIS — R634 Abnormal weight loss: Secondary | ICD-10-CM | POA: Diagnosis not present

## 2022-06-07 DIAGNOSIS — Z9049 Acquired absence of other specified parts of digestive tract: Secondary | ICD-10-CM | POA: Insufficient documentation

## 2022-06-07 DIAGNOSIS — R197 Diarrhea, unspecified: Secondary | ICD-10-CM | POA: Diagnosis not present

## 2022-06-07 DIAGNOSIS — D649 Anemia, unspecified: Secondary | ICD-10-CM | POA: Insufficient documentation

## 2022-06-07 DIAGNOSIS — C3492 Malignant neoplasm of unspecified part of left bronchus or lung: Secondary | ICD-10-CM

## 2022-06-07 MED ORDER — GABAPENTIN 300 MG PO CAPS: 300.0000 mg | ORAL_CAPSULE | Freq: Three times a day (TID) | ORAL | 3 refills | Status: DC

## 2022-06-07 MED ORDER — KETOROLAC TROMETHAMINE 60 MG/2ML IM SOLN
60.0000 mg | Freq: Once | INTRAMUSCULAR | Status: AC
  Administered 2022-06-07: 60 mg via INTRAMUSCULAR
  Filled 2022-06-07: qty 2

## 2022-06-07 MED ORDER — HYDROMORPHONE HCL 2 MG PO TABS: 2.0000 mg | ORAL_TABLET | ORAL | 0 refills | Status: DC | PRN

## 2022-06-07 MED ORDER — PREDNISONE 50 MG PO TABS: 50.0000 mg | ORAL_TABLET | Freq: Every day | ORAL | 0 refills | Status: DC

## 2022-06-07 NOTE — Patient Instructions (Addendum)
[place] at [facility] Discharge Instructions   You were seen and examined today by Dr. Delton Coombes.  He reviewed the results of your MRI. It is showing that there are multiple spots of cancer, but it is in the bone and should not be causing this pain. It did show that there is disc bulging in an area of the lower back that is pressing on the nerve and likely causing the pain in your left leg.   Dr. Raliegh Ip would like you to increase the gabapentin to 300 mg three times a day. We will send a new prescription for this. We will also send a short course of steroids (prednisone) to your pharmacy. This will be 50 mg daily with breakfast for a total of 5 days. We will also give a shot today for pain. This is called Toradol.   You have lost seven pounds since coming off the TPN. Continue Megace (appetite stimulating medicaiton) 2 teaspoons twice a day.    Thank you for choosing [place] at [facility] to provide your oncology and hematology care.  To afford each patient quality time with our provider, please arrive at least 15 minutes before your scheduled appointment time.   If you have a lab appointment with the [facility] please come in thru the Main Entrance and check in at the main information desk.  You need to re-schedule your appointment should you arrive 10 or more minutes late.  We strive to give you quality time with our providers, and arriving late affects you and other patients whose appointments are after yours.  Also, if you no show three or more times for appointments you may be dismissed from the clinic at the providers discretion.     Again, thank you for choosing [facility].  Our hope is that these requests will decrease the amount of time that you wait before being seen by our physicians.       _____________________________________________________________  Should you have questions after your visit to [facility], please contact our office at [phone] and follow the prompts.  Our office hours are 8:00 a.m. and 4:30 p.m. Monday - Friday.  Please note that voicemails left after 4:00 p.m. may not be returned until the following business day.  We are closed weekends and major holidays.  You do have access to a nurse 24-7, just call the main number to the clinic [phone] and do not press any options, hold on the line and a nurse will answer the phone.    For prescription refill requests, have your pharmacy contact our office and allow 72 hours.    Due to Covid, you will need to wear a mask upon entering the hospital. If you do not have a mask, a mask will be given to you at the Main Entrance upon arrival. For doctor visits, patients may have 1 support person age 58 or older with them. For treatment visits, patients can not have anyone with them due to social distancing guidelines and our immunocompromised population.

## 2022-06-07 NOTE — Patient Instructions (Signed)
Ketorolac Injection What is this medication? KETOROLAC (kee toe ROLE ak) treats short-term moderate to severe pain. It works by decreasing inflammation. It belongs to a group of medications called NSAIDs. This medicine may be used for other purposes; ask your health care provider or pharmacist if you have questions. COMMON BRAND NAME(S): Toradol What should I tell my care team before I take this medication? They need to know if you have any of these conditions: Bleeding disorder Coronary artery bypass graft (CABG) within the past 2 weeks Frequently drink alcohol Heart attack Heart disease Heart failure High blood pressure Kidney disease Liver disease Lung or breathing disease, such as asthma or COPD Receiving steroids, such as dexamethasone or prednisone Stomach bleeding Stomach ulcers, other stomach or intestine problems Take medication to treat or prevent blood clots Tobacco use An unusual or allergic reaction to ketorolac, other medications, foods, dyes, or preservatives Pregnant or trying to get pregnant Breastfeeding How should I use this medication? This medication is injected into a vein or muscle. It is given by your care team in a hospital or clinic setting. A special MedGuide will be given to you before each treatment. Be sure to read this information carefully each time. Talk to your care team about the use of this medication in children. Special care may be needed. People 65 years and older may have a stronger reaction and need a smaller dose. Overdosage: If you think you have taken too much of this medicine contact a poison control center or emergency room at once. NOTE: This medicine is only for you. Do not share this medicine with others. What if I miss a dose? This does not apply. This medication is not for regular use. What may interact with this medication? Do not take this medication with any of the following: Aspirin and aspirin-like  medications Cidofovir Methotrexate NSAIDs, medications for pain and inflammation, such as ibuprofen or naproxen Pentoxifylline Probenecid This medication may also interact with the following: Alcohol Alendronate Alprazolam Carbamazepine Diuretics Flavocoxid Fluoxetine Ginkgo Lithium Medications for blood pressure, such as enalapril Medications that affect platelets, such as pentoxifylline Medications that prevent or treat blood clots, such as heparin or warfarin Medications that relax muscles Pemetrexed Phenytoin Thiothixene This list may not describe all possible interactions. Give your health care provider a list of all the medicines, herbs, non-prescription drugs, or dietary supplements you use. Also tell them if you smoke, drink alcohol, or use illegal drugs. Some items may interact with your medicine. What should I watch for while using this medication? Your condition will be monitored carefully while you are receiving this medication. Do not use this medication for more than 5 days. It is only used for short-term treatment of moderate to severe pain. The risk of side effects such as kidney damage and stomach bleeding are higher if used for more than 5 days. Do not take other medications that contain aspirin, ibuprofen, or naproxen with this medication. Side effects such as stomach upset, nausea, or ulcers may be more likely to occur. Many non-prescription medications contain aspirin, ibuprofen, or naproxen. Always read labels carefully. This medication can cause serious ulcers and bleeding in the stomach. It can happen with no warning. Tobacco, alcohol, older age, and poor health can also increase risks. Call your care team right away if you have stomach pain or blood in your vomit or stool. This medication may cause serious skin reactions. They can happen weeks to months after starting the medication. Contact your care team right away  if you notice fevers or flu-like symptoms with  a rash. The rash may be red or purple and then turn into blisters or peeling of the skin. You may also notice a red rash with swelling of the face, lips, or lymph nodes in your neck or under your arms. Talk to your care team if you wish to become pregnant or think you might be pregnant. This medication can cause serious birth defects. This medication does not prevent a heart attack or stroke. This medication may increase the chance of a heart attack or stroke. The chance may increase the longer you use this medication or if you have heart disease. If you take aspirin to prevent a heart attack or stroke, talk to your care team about using this medication. This medication may affect your coordination, reaction time, or judgment. Do not drive or operate machinery until you know how this medication affects you. Sit up or stand slowly to reduce the risk of dizzy or fainting spells. Drinking alcohol with this medication can increase the risk of these side effects. What side effects may I notice from receiving this medication? Side effects that you should report to your care team as soon as possible: Allergic reactions--skin rash, itching, hives, swelling of the face, lips, tongue, or throat Heart attack--pain or tightness in the chest, shoulders, arms, or jaw, nausea, shortness of breath, cold or clammy skin, feeling faint or lightheaded Heart failure--shortness of breath, swelling of ankles, feet, or hands, sudden weight gain, unusual weakness or fatigue Increase in blood pressure Kidney injury--decrease in the amount of urine, swelling of the ankles, hands, or feet Liver injury--right upper belly pain, loss of appetite, nausea, light-colored stool, dark yellow or brown urine, yellowing skin or eyes, unusual weakness or fatigue Low red blood cell count--unusual weakness or fatigue, dizziness, headache, trouble breathing Rash, fever, and swollen lymph nodes Redness, blistering, peeling, or loosening of the  skin, including inside the mouth Stomach bleeding--bloody or black, tar-like stools, vomiting blood or brown material that looks like coffee grounds Stroke--sudden numbness or weakness of the face, arm, or leg, trouble speaking, confusion, trouble walking, loss of balance or coordination, dizziness, severe headache, change in vision Side effects that usually do not require medical attention (report to your care team if they continue or are bothersome): Constipation Diarrhea Dizziness Headache Nausea Stomach pain This list may not describe all possible side effects. Call your doctor for medical advice about side effects. You may report side effects to FDA at 1-800-FDA-1088. Where should I keep my medication? This medication is given in a hospital or clinic. It will not be stored at home. NOTE: This sheet is a summary. It may not cover all possible information. If you have questions about this medicine, talk to your doctor, pharmacist, or health care provider.  2023 Elsevier/Gold Standard (2021-09-14 00:00:00)

## 2022-06-07 NOTE — Progress Notes (Signed)
Toradol 60 mg given in L Gluteal muscle.  Site CDI.  Patient tolerated well without incident.  Discharged via w/c in stable condition with family.

## 2022-06-08 ENCOUNTER — Encounter: Payer: Self-pay | Admitting: Internal Medicine

## 2022-06-08 ENCOUNTER — Ambulatory Visit (INDEPENDENT_AMBULATORY_CARE_PROVIDER_SITE_OTHER): Payer: Medicare Other | Admitting: Internal Medicine

## 2022-06-08 ENCOUNTER — Encounter (HOSPITAL_COMMUNITY): Payer: Self-pay

## 2022-06-08 VITALS — BP 132/78 | HR 121 | Temp 97.4°F | Ht 68.0 in | Wt 134.8 lb

## 2022-06-08 DIAGNOSIS — R197 Diarrhea, unspecified: Secondary | ICD-10-CM | POA: Diagnosis not present

## 2022-06-08 DIAGNOSIS — K8689 Other specified diseases of pancreas: Secondary | ICD-10-CM | POA: Diagnosis not present

## 2022-06-08 DIAGNOSIS — C3490 Malignant neoplasm of unspecified part of unspecified bronchus or lung: Secondary | ICD-10-CM

## 2022-06-08 NOTE — Patient Instructions (Signed)
It was good to see you again today!  No change in your GI medications at this time  Specifically, continue Creon, pantoprazole and Prevalite  You should also continue taking Megace  As discussed, would be less risky to cook shellfish rather than eating raw shellfish  Also, try Carnation instant breakfast utilizing low-fat as a nutritional supplement  We will send off a stool for C. difficile testing  Further recommendations to follow.

## 2022-06-08 NOTE — Progress Notes (Signed)
Primary Care Physician:  [name], MD Primary Gastroenterologist:  Dr.   Pre-Procedure History & Physical: HPI:  [name] is a 72 y.o. male here for follow-up of GI bleeding.  Patient has unfortunately has a myriad of problems including metastatic lung cancer history of adenocarcinoma of the colon and pancreas.  He has widespread metastatic disease.  He has declined further chemotherapy.  He has pancreatic exocrine insufficiency.  GI bleeding earlier this year felt to be due to gastric anastomosis bleeding - site was treated with epinephrine and thermal coagulation.  Last hemoglobin up to 10.3 couple of weeks ago.  Has not had any hematochezia or melena.  He is anorexic most of the time.  He continues taking Creon and Megace along with pantoprazole.  Also, has a history of short gut syndrome and chronically loose stools.  Takes Prevalite which helps to a modest degree.  Had C. difficile last fall.  He has bony pain and chronic abdominal pain;  Dilaudid added to his regimen yesterday but he has not gotten the medication as of yet. Dietary recall from yesterday reveals he only ate "6 raw oysters"; nothing else.  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   Stage 3   Anemia    Anxiety    Arthritis    Barrett's esophagus    Blood transfusion    Bowel obstruction ([place]) 05/13/2012   Recurrent   Bronchitis    Chest pain at rest    Chronic abdominal pain    Erosive esophagitis    ETOH abuse    quit 03/2010   GERD (gastroesophageal reflux disease)    Hx of Clostridium difficile infection 01/2012   Hypertension    Ileus ([place])    Iron deficiency anemia 03/23/2016   Lung cancer ([place])    Obstruction of bowel ([place]) 03/03/2014   Osteoporosis    Pancreatic cancer ([facility])    Personal history of PE (pulmonary embolism) 10/01/2010   Pneumonia    Pulmonary embolism ([place]) 02/2010   Recurrent upper respiratory infection (URI)     Renal disorder    S/P endoscopy 09/28/2010   erosive reflux esophagitis, Billroth I anatomy   S/P partial gastrectomy 1980s   Seizures ([place])    was on meds for 6 months, Unknown etiology, last seizure was 2017.   Shortness of breath    TIA (transient ischemic attack) 12/2009   Vitamin B12 deficiency     Past Surgical History:  Procedure Laterality Date   ABDOMINAL ADHESION SURGERY  03/04/15   @ [facility]   ABDOMINAL EXPLORATION SURGERY     abdominal sugery     for bowel obstruction x 8, all in 1980s, except for one in 07/2010   APPENDECTOMY  1980s   Billroth 1 hemigastrectomy  1980s   per patient for benign duodenal tumor   BIOPSY  01/10/2022   Procedure: BIOPSY;  Surgeon: Eloise Harman, DO;  [facility];  Service: Endoscopy;;  gastric,ileum   CARDIAC CATHETERIZATION  07/17/2012   CHOLECYSTECTOMY  1980s   COLON SURGERY  May 2012   left hemicolectomy, colon cancer found at time of surgery for bowel obstruction   COLON SURGERY  10/12/2018   At [facility]: Resection of right colon and terminal ileum with creation of ileorectal anastomosis for large bowel obstruction.  Cecum and ascending colon noted to be attached to the rectum.   COLONOSCOPY  03/18/2011   anastomosis at 35cm. Several  adenomatous polyps removed. Sigmoid diverticulosis. Next TCS 02/2013   COLONOSCOPY N/A 07/24/2012   [id number] post segmental resection with normal-appearing colonic anastomosis aside from an adjacent polyp-removed as described above. Rectal polyp-removed as described above. CT findings appear to have been artifactual. tubular adenomas/prolapsed type polyp.   COLONOSCOPY N/A 05/15/2015   Procedure: COLONOSCOPY;  Surgeon: [name], MD;  [facility];  Service: Endoscopy;  Laterality: N/A;   COLONOSCOPY  09/26/2018   at [facility]: prep not adequate for colon cancer surveillance.  Prior end-to-end colonic anastomosis in the rectosigmoid region.  This was patent and characterized by  healthy-appearing mucosa.  Anastomosis was traversed.  Normal terminal ileum.   COLONOSCOPY WITH PROPOFOL N/A 11/04/2016   Dr. [name], status post subtotal colectomy with normal-appearing residual lower GI tract.  Next colonoscopy in 5 years.   ENTEROSCOPY N/A 04/07/2022   Procedure: ENTEROSCOPY;  Surgeon: [name], MD;  [facility];  Service: Endoscopy;  Laterality: N/A;   ESOPHAGOGASTRODUODENOSCOPY  09/28/2010   ESOPHAGOGASTRODUODENOSCOPY  12/01/2010   Cervical web status post dilation, erosive esophagitis, B1 hemigastrectomy, inflamed anastomosis   ESOPHAGOGASTRODUODENOSCOPY  04/16/2011   excoriation at [facility] c/w trauma/M-W tear, friable gastric anastomosis, dilation efferent limb   ESOPHAGOGASTRODUODENOSCOPY N/A 06/03/2014   Dr.Christena Sunderlin- cervcal esopphageal web s/p dilation. abnormal distal esophagus bx= barretts esophagus   ESOPHAGOGASTRODUODENOSCOPY N/A 05/15/2015   Procedure: ESOPHAGOGASTRODUODENOSCOPY (EGD);  Surgeon: [name], MD;  [facility];  Service: Endoscopy;  Laterality: N/A;  230   ESOPHAGOGASTRODUODENOSCOPY (EGD) WITH ESOPHAGEAL DILATION  02/25/2012   [id number] esophageal web-s/p dilation anddisruption as described above. Status post prior gastric with Billroth I configuration. Abnormal gastric mucosa at the anastomosis. Gastric biopsy showed mild chronic inflammation but no H. pylori    ESOPHAGOGASTRODUODENOSCOPY (EGD) WITH PROPOFOL N/A 11/04/2016   Dr. [name]: Reflux esophagitis, small hiatal hernia status post hemigastrectomy.  Single patent efferent small bowel limb appeared normal, 2 x 2 centimeter tongue of salmon epithelium again seen, esophageal dilation.  Biopsies consistent with reflux changes, not Barrett's however this was confirmed on prior EGDs.  Offer 3-year follow-up EGD August 2021.   ESOPHAGOGASTRODUODENOSCOPY (EGD) WITH PROPOFOL N/A 03/03/2020   Procedure: ESOPHAGOGASTRODUODENOSCOPY (EGD) WITH PROPOFOL;  Surgeon: [name], MD;   [facility];  Service: Endoscopy;  Laterality: N/A;  3:00pm   ESOPHAGOGASTRODUODENOSCOPY (EGD) WITH PROPOFOL N/A 01/10/2022   Procedure: ESOPHAGOGASTRODUODENOSCOPY (EGD) WITH PROPOFOL;  Surgeon: Eloise Harman, DO;  [facility];  Service: Endoscopy;  Laterality: N/A;   ESOPHAGOGASTRODUODENOSCOPY (EGD) WITH PROPOFOL N/A 04/07/2022   Procedure: ESOPHAGOGASTRODUODENOSCOPY (EGD) WITH PROPOFOL;  Surgeon: [name], MD;  [facility];  Service: Endoscopy;  Laterality: N/A;   FLEXIBLE SIGMOIDOSCOPY N/A 01/10/2022   Procedure: FLEXIBLE SIGMOIDOSCOPY;  Surgeon: Eloise Harman, DO;  [facility];  Service: Endoscopy;  Laterality: N/A;   HERNIA REPAIR     right inguinal   HOT HEMOSTASIS  04/07/2022   Procedure: HOT HEMOSTASIS (ARGON PLASMA COAGULATION/BICAP);  Surgeon: [name], MD;  [facility];  Service: Endoscopy;;   IR IMAGING GUIDED PORT INSERTION  05/10/2022   MALONEY DILATION N/A 06/03/2014   Procedure: [name] DILATION;  Surgeon: [name], MD;  [facility];  Service: Endoscopy;  Laterality: N/A;   MALONEY DILATION N/A 05/15/2015   Procedure: [name] DILATION;  Surgeon: [name], MD;  [facility];  Service: Endoscopy;  Laterality: N/A;   MALONEY DILATION  N/A 11/04/2016   Procedure: [name] DILATION;  Surgeon: [name], MD;  [facility];  Service: Endoscopy;  Laterality: N/A;   MALONEY DILATION N/A 03/03/2020   Procedure: [name] DILATION;  Surgeon: [name], MD;  [facility];  Service: Endoscopy;  Laterality: N/A;   PORT-A-CATH REMOVAL Left 04/08/2022   Procedure: MINOR REMOVAL PORT-A-CATH;  Surgeon: Rusty Aus, DO;  Location: [facility];  Service: General;  Laterality: Left;   PORTACATH PLACEMENT     SAVORY DILATION N/A 06/03/2014   Procedure: SAVORY DILATION;  Surgeon: [name], MD;  [facility];  Service: Endoscopy;   Laterality: N/A;   SUBMUCOSAL INJECTION  04/07/2022   Procedure: SUBMUCOSAL INJECTION;  Surgeon: [name], MD;  [facility];  Service: Endoscopy;;   SUBMUCOSAL TATTOO INJECTION  04/07/2022   Procedure: SUBMUCOSAL TATTOO INJECTION;  Surgeon: [name], MD;  [facility];  Service: Endoscopy;;   TEE WITHOUT CARDIOVERSION N/A 04/09/2022   Procedure: TRANSESOPHAGEAL ECHOCARDIOGRAM (TEE);  Surgeon: Arnoldo Lenis, MD;  Location: [facility];  Service: Endoscopy;  Laterality: N/A;    Prior to Admission medications   Medication Sig Start Date End Date Taking? Authorizing Provider  atorvastatin (LIPITOR) 20 MG tablet Take 1 tablet (20 mg total) by mouth daily. 04/23/22  Yes Barton Dubois, MD  Bacillus Coagulans-Inulin (PROBIOTIC) 1-250 BILLION-MG CAPS Take 1 capsule by mouth daily.   Yes [provider]  bacitracin ointment Apply topically daily. 04/13/22  Yes Barton Dubois, MD  chlorproMAZINE (THORAZINE) 25 MG tablet Take 1 tablet (25 mg total) by mouth every 6 (six) hours as needed. Patient taking differently: Take 25 mg by mouth every 6 (six) hours as needed for nausea or vomiting. 01/01/22  Yes Derek Jack, MD  Cholecalciferol (VITAMIN D) 2000 units CAPS Take 2,000 Units by mouth daily.   Yes [provider]  CREON 36000-114000 units CPEP capsule Take 72,000-108,000 Units by mouth See admin instructions. Take 3 capsules (108,000 units) by mouth with meals and take 2 capsules (72,000 units) with snacks. 11/18/21  Yes [provider]  cyanocobalamin 100 MCG tablet Take by mouth.   Yes [provider]  DAPTOmycin (CUBICIN) 500 MG injection    Yes [provider]  diclofenac Sodium (VOLTAREN) 1 % GEL Apply 4 g topically 4 (four) times daily. 01/28/22  Yes [provider]  dicyclomine (BENTYL) 10 MG capsule Take 10 mg by mouth 2 (two) times daily before a meal. 01/18/22 01/18/23 Yes [provider]   diphenoxylate-atropine (LOMOTIL) 2.5-0.025 MG tablet Take 1 tablet by mouth 4 times daily 02/04/22  Yes Mahala Menghini, PA-C  ELIQUIS 5 MG TABS tablet TAKE 1 TABLET BY MOUTH TWICE A DAY 05/26/22  Yes Derek Jack, MD  escitalopram (LEXAPRO) 10 MG tablet TAKE 1 TABLET BY MOUTH EVERY DAY 04/27/22  Yes Derek Jack, MD  escitalopram (LEXAPRO) 20 MG tablet TAKE 1 TABLET BY MOUTH EVERY DAY 05/30/22  Yes Derek Jack, MD  feeding supplement (ENSURE ENLIVE / ENSURE PLUS) LIQD Take 237 mLs by mouth 2 (two) times daily between meals. 04/13/22  Yes Barton Dubois, MD  ferrous sulfate 325 (65 FE) MG EC tablet Take by mouth.   Yes [provider]  gabapentin (NEURONTIN) 300 MG capsule Take 1 capsule (300 mg total) by mouth 3 (three) times daily. 06/07/22  Yes Derek Jack, MD  HYDROcodone-acetaminophen (NORCO) 10-325 MG tablet Take 1 tablet by mouth every 6 (six)  hours as needed. 05/24/22  Yes Derek Jack, MD  hydrocortisone cream 1 % Apply 1 Application topically 2 (two) times daily.   Yes [provider]  HYDROmorphone (DILAUDID) 2 MG tablet Take 1 tablet (2 mg total) by mouth every 4 (four) hours as needed for severe pain. 06/07/22  Yes Derek Jack, MD  magnesium oxide (MAG-OX) 400 (240 Mg) MG tablet TAKE 1 TABLET BY MOUTH TWICE A DAY 01/22/22  Yes Derek Jack, MD  megestrol (MEGACE) 40 MG/ML suspension TAKE 10 MLS (400 MG TOTAL) BY MOUTH 2 (TWO) TIMES DAILY. 05/30/22  Yes Derek Jack, MD  mirtazapine (REMERON) 30 MG tablet TAKE 1 TABLET BY MOUTH AT BEDTIME 02/16/22  Yes Derek Jack, MD  Multiple Vitamin (INFUVITE ADULT IV) 5 mLs See admin instructions. Inject 5 mls from each vial #1 and #2   Yes [provider]  Multiple Vitamin (MULTIVITAMIN WITH MINERALS) TABS tablet Take 1 tablet by mouth daily.   Yes [provider]  ondansetron (ZOFRAN-ODT) 4 MG disintegrating tablet TAKE 1 TABLET BY MOUTH EVERY 4  HOURS AS NEEDED FOR NAUSEA AND VOMITING 04/02/22  Yes Noemi Chapel, MD  pantoprazole (PROTONIX) 40 MG tablet Take 1 tablet (40 mg total) by mouth 2 (two) times daily before a meal. 06/15/21  Yes Mahala Menghini, PA-C  predniSONE (DELTASONE) 50 MG tablet Take 1 tablet (50 mg total) by mouth daily with breakfast. 06/07/22  Yes Derek Jack, MD  PREVALITE 4 g packet Take 1 packet (4 g total) by mouth daily. DO NOT TAKE WITHIN 2 HOURS OF OTHER MEDICATIONS 08/04/21  Yes Mahala Menghini, PA-C  prochlorperazine (COMPAZINE) 10 MG tablet Take 10 mg by mouth every 6 (six) hours as needed for vomiting or nausea.   Yes [provider]  sterile water SOLN with Travasol 10 % SOLN 50 g/L, dextrose 70 % SOLN 20 %, sodium INJ 35 mEq/L, potassium INJ 30 mEq/L, calcium GLUCONATE INJ 4.7 mEq/L, magnesium SULFATE INJ 5 mEq/L, phosphorus INJ 15 mmol/L, M.V.I. Adult INJ 10 mL, trace elements Cr-Cu-Mn-Se-Zn (MTE-5 Concentrate) 12-998-500-60 MCG/ML SOLN 1 mL, fat emulsion 30 % EMUL 100 mL/L continuous.   Yes [provider]  sucralfate (CARAFATE) 1 GM/10ML suspension TAKE 10 MLS (1 G TOTAL) BY MOUTH 4 (FOUR) TIMES DAILY AS NEEDED. [place] HEARTBURN Patient taking differently: Take 1 g by mouth 4 (four) times daily as needed (breakthrough heartburn). 10/22/20  Yes Mahala Menghini, PA-C  traMADol (ULTRAM) 50 MG tablet Take 1 tablet by mouth every 6 (six) hours as needed for moderate pain or severe pain. 09/07/21  Yes [provider]    Allergies as of 06/08/2022   (No Known Allergies)    Family History  Problem Relation Age of Onset   Hypertension Mother    Arthritis Mother    Pneumonia Mother    Hypertension Father    Heart attack Father    Bone cancer Cousin    Colon cancer Neg Hx     Social History   Socioeconomic History   Marital status: Married    Spouse name: Not on file   Number of children: 3   Years of education: Not on file   Highest education level: Not on  file  Occupational History    Employer: [employer]  Tobacco Use   Smoking status: Former    Packs/day: 0.50    Years: 40.00    Additional pack years: 0.00    Total pack years: 20.00  Types: Cigarettes    Quit date: 12/20/2012    Years since quitting: 9.4   Smokeless tobacco: Never  Vaping Use   Vaping Use: Never used  Substance and Sexual Activity   Alcohol use: No   Drug use: No   Sexual activity: Not Currently  Other Topics Concern   Not on file  Social History Narrative   Not on file   Social Determinants of Health   Financial Resource Strain: Not on file  Food Insecurity: No Food Insecurity (04/03/2022)   Hunger Vital Sign    Worried About Running Out of Food in the Last Year: Never true    Ran Out of Food in the Last Year: Never true  Transportation Needs: No Transportation Needs (04/03/2022)   PRAPARE - Hydrologist (Medical): No    Lack of Transportation (Non-Medical): No  Physical Activity: Not on file  Stress: Not on file  Social Connections: Not on file  Intimate Partner Violence: Not At Risk (04/03/2022)   Humiliation, Afraid, Rape, and Kick questionnaire    Fear of Current or Ex-Partner: No    Emotionally Abused: No    Physically Abused: No    Sexually Abused: No    Review of Systems: See HPI, otherwise negative ROS  Physical Exam: BP 132/78 (BP Location: Right Arm, Patient Position: Sitting, Cuff Size: Normal)   Pulse (!) 121   Temp (!) 97.4 F (36.3 C) (Oral)   Ht 5\' 8"  (1.727 m)   Wt 134 lb 12.8 oz (61.1 kg)   SpO2 96%   BMI 20.50 kg/m  General:   Malnourished, somewhat cachectic appearing male accompanied by his wife. Mouth:  No deformity or lesions. Neck:  Supple; no masses or thyromegaly. No significant cervical adenopathy. Lungs:  Clear throughout to auscultation.   No wheezes, crackles, or rhonchi. No acute distress. Heart:  Regular rate and rhythm; no murmurs, clicks, rubs,  or gallops. Abdomen:  Non-distended, normal bowel sounds.  Soft and nontender without appreciable mass or hepatosplenomegaly.  Pulses:  Normal pulses noted. Extremities:  Without clubbing or edema.  Impression/Plan: Unfortunate 72 year old gentleman who has terminal with widespread metastatic lung cancer.  Also, history of pancreatic and distant history of colon cancer. Followed closely by Dr. Delton Coombes.  Has not had recurrent GI bleed clinically at this time. Main focus now is on comfort/quality of life issues.  He is declined further specific therapy for his neoplastic disease.  Discussed with patient he should not eat raw shellfish, particularly in his situation; any any shellfish consumed should be cooked.  Recommendations:  No change in GI medical regimen at this time.  Specifically, continue Creon, pantoprazole and Prevalite; patient reminded not to take Prevalite within 2 hours of any other medications.  Continue taking Megace  As discussed, would be less risky to cook shellfish rather than eating raw shellfish  Also, try [employer] instant breakfast utilizing low-fat as a nutritional supplement  We will send off a stool for C. difficile testing  Further recommendations to follow.    Notice: This dictation was prepared with Dragon dictation along with smaller phrase technology. Any transcriptional errors that result from this process are unintentional and may not be corrected upon review.

## 2022-06-09 ENCOUNTER — Other Ambulatory Visit: Payer: Self-pay | Admitting: *Deleted

## 2022-06-09 MED ORDER — HYDROMORPHONE HCL 2 MG PO TABS: 2.0000 mg | ORAL_TABLET | ORAL | 0 refills | Status: DC | PRN

## 2022-06-10 ENCOUNTER — Encounter: Payer: Self-pay | Admitting: *Deleted

## 2022-06-10 ENCOUNTER — Other Ambulatory Visit: Payer: Self-pay | Admitting: *Deleted

## 2022-06-10 MED ORDER — HYDROMORPHONE HCL 2 MG PO TABS: 2.0000 mg | ORAL_TABLET | ORAL | 0 refills | Status: DC | PRN

## 2022-06-10 NOTE — Progress Notes (Signed)
Hospice referral faxed to [facility] to evaluate for hospice services, to include: order, office notes, rad exams and labs.

## 2022-06-15 ENCOUNTER — Inpatient Hospital Stay: Payer: Medicare Other | Admitting: Hematology

## 2022-06-15 ENCOUNTER — Other Ambulatory Visit: Payer: Medicare Other

## 2022-06-15 NOTE — Progress Notes (Incomplete)
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  No lymphadenopathy. - EUS and FNA (09/21/2021): Oval mass in the pancreatic tail, hypoechoic, 20 mm x 17 mm.  Remainder of the pancreas shows decreased visualization of the pancreas. - Pathology: Invasive adenocarcinoma, ductal type, moderately to poorly differentiated.  Background desmoplastic stroma, blood and pancreatic tissue.  Morphologically distinct from prior lung adenocarcinoma.  TTF-1 and Napsin A were negative. - Patient evaluated by Dr. Dayton Scrape at [facility] for pancreatic resection. - CA 19-9 (10/02/2021): 6.47 - PET CT scan (10/02/2021): Ill-defined 4 cm mass in the left upper quadrant in the region of the pancreatic tail with mild hypermetabolism, SUV 4.7.  Soft tissue nodule in the gastrohepatic ligament measuring 2 x 1.3 cm, SUV 4.0.  Suspicious for metastatic lymphadenopathy.  9 mm mediastinal lymph node and AP window is hypermetabolic SUV 4.9.  Borderline enlarged left hilar lymph nodes show hypermetabolic activity SUV 3.4.  Previously seen subpleural nodule in the superior right lower lobe has nearly completely resolved.  Overall mild hypermetabolic mediastinal and left hilar lymph nodes which are nonspecific.  May be reactive in etiology although metastatic disease cannot definitely be excluded.        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 -Last EGD/colonoscopy on 05/15/2015 was normal. - CT scan in October 2018 was negative for metastatic disease. 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  Margins negative.  2 hilar lymph nodes were negative. - No adjuvant chemotherapy was needed.    Social/family history: - Lives at home with his wife.  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  No chemical exposure.  Quit smoking in 2014 and smoked 1 pack/day for 44 years. - Paternal cousin has bone cancer.  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  Tumor cells are positive for CK7, TTF-1, Napsin A and negative for CDX2 and CK20. - He has reported pain shooting down the left lower extremity. - We reviewed MRI's of the spine from 05/29/2022: Numerous enhancing lesions throughout the spine and pelvis with no epidural tumor or pathologic fracture.  1.2 cm mass in the right posterior paraspinal musculature at L4.  Disc protrusion at L5-S1 with left S1 nerve root impingement.  This is most likely cause of patient's pain. - Recommend prednisone 50 mg daily for 5 days. - Recommend gabapentin dose increased to 300 mg 3 times daily.  He will receive Toradol 60 mg injection today.  He is taking hydrocodone 10/325 which  is not helping.  He is staying in bed 90% of the time per wife. - His NGS panel is pending at this time. - We will send prescription for Dilaudid as this worked in the past. - He does not want to have any  chemotherapy but is open to the immunotherapy and other targeted treatments.  Will discuss after the NGS panel is reported next week.    2.  Normocytic anemia: - Last hemoglobin was 10.2.  No indication for parenteral iron therapy.   3.  Chronic diarrhea: - Continue Imodium and Lomotil as needed.   4.  Weight loss: - He has gained close to 15 pounds while he was on TPN. - Last TPN on 05/28/2022. - He lost 7 pounds since last visit.  He is eating 1 meal per day.  He is on Megace 10 mL twice daily.  No orders of the defined types were placed in this encounter.   I,Alexis Herring,acting as a Education administrator for Derek Jack, MD.,have documented all relevant documentation on the behalf of Derek Jack, MD,as directed by  Derek Jack, MD while in the presence of Derek Jack, MD.  ***   Alexis Herring   3/26/202410:16 AM  CHIEF COMPLAINT:   Diagnosis: stage IIb adenocarcinoma of the pancreatic tail    Cancer Staging  Adenocarcinoma of colon with mucinous features Staging form: Colon and Rectum, AJCC 7th Edition - Clinical: Stage IIIB (T3, N1, M0) - Signed by Baird Cancer, PA on 09/22/2010  Adenocarcinoma of left lung [facility]) Staging form: Lung, AJCC 8th Edition - Clinical stage from 05/18/2022: Stage IVB (rcTX, cN0, cM1c) - Signed by Derek Jack, MD on 05/18/2022  Pancreatic cancer [facility]) Staging form: Exocrine Pancreas, AJCC 8th Edition - Clinical stage from 10/07/2021: Stage IIB (cT1, cN1, cM0) - Unsigned    Prior Therapy: FOLFIRINOX  Current Therapy: Under workup ***  HISTORY OF PRESENT ILLNESS:   Oncology History  Pancreatic cancer ([place])  10/07/2021 Initial Diagnosis   Pancreatic cancer ([place])   10/13/2021 - 11/19/2021 Chemotherapy   Patient is on Treatment Plan : PANCREAS Modified FOLFIRINOX q14d x 4 cycles     10/13/2021 -  Chemotherapy   Patient is on Treatment Plan : PANCREAS Modified FOLFIRINOX q14d x 4 cycles     Adenocarcinoma of  left lung ([place])  05/18/2022 Initial Diagnosis   Adenocarcinoma of left lung ([place])   05/18/2022 Cancer Staging   Staging form: Lung, AJCC 8th Edition - Clinical stage from 05/18/2022: Stage IVB (rcTX, cN0, cM1c) - Signed by Derek Jack, MD on 05/18/2022 Histopathologic type: Adenocarcinoma, NOS Stage prefix: Recurrence      INTERVAL HISTORY:   [name] is a 72 y.o. male presenting to clinic today for follow up of stage IIb adenocarcinoma of the pancreatic tail. He was last seen by me on 06/07/22.  Today, he states that he is doing well overall. His appetite level is at ***%. His energy level is at ***%.   PAST MEDICAL HISTORY:   Past Medical History: 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   Stage 3   Anemia    Anxiety    Arthritis    Barrett's esophagus    Blood transfusion    Bowel obstruction ([place]) 05/13/2012   Recurrent   Bronchitis    Chest pain at rest    Chronic abdominal pain    Erosive esophagitis    ETOH abuse    quit 03/2010   GERD (  gastroesophageal reflux disease)    Hx of Clostridium difficile infection 01/2012   Hypertension    Ileus ([place])    Iron deficiency anemia 03/23/2016   Lung cancer ([place])    Obstruction of bowel ([place]) 03/03/2014   Osteoporosis    Pancreatic cancer ([facility])    Personal history of PE (pulmonary embolism) 10/01/2010   Pneumonia    Pulmonary embolism ([place]) 02/2010   Recurrent upper respiratory infection (URI)    Renal disorder    S/P endoscopy 09/28/2010   erosive reflux esophagitis, Billroth I anatomy   S/P partial gastrectomy 1980s   Seizures ([place])    was on meds for 6 months, Unknown etiology, last seizure was 2017.   Shortness of breath    TIA (transient ischemic attack) 12/2009   Vitamin B12 deficiency     Surgical History: Past Surgical History:  Procedure Laterality Date   ABDOMINAL ADHESION SURGERY  03/04/15   @ [facility]   ABDOMINAL EXPLORATION SURGERY      abdominal sugery     for bowel obstruction x 8, all in 1980s, except for one in 07/2010   APPENDECTOMY  1980s   Billroth 1 hemigastrectomy  1980s   per patient for benign duodenal tumor   BIOPSY  01/10/2022   Procedure: BIOPSY;  Surgeon: Eloise Harman, DO;  [facility];  Service: Endoscopy;;  gastric,ileum   CARDIAC CATHETERIZATION  07/17/2012   CHOLECYSTECTOMY  1980s   COLON SURGERY  May 2012   left hemicolectomy, colon cancer found at time of surgery for bowel obstruction   COLON SURGERY  10/12/2018   At [facility]: Resection of right colon and terminal ileum with creation of ileorectal anastomosis for large bowel obstruction.  Cecum and ascending colon noted to be attached to the rectum.   COLONOSCOPY  03/18/2011   anastomosis at 35cm. Several adenomatous polyps removed. Sigmoid diverticulosis. Next TCS 02/2013   COLONOSCOPY N/A 07/24/2012   [id number] post segmental resection with normal-appearing colonic anastomosis aside from an adjacent polyp-removed as described above. Rectal polyp-removed as described above. CT findings appear to have been artifactual. tubular adenomas/prolapsed type polyp.   COLONOSCOPY N/A 05/15/2015   Procedure: COLONOSCOPY;  Surgeon: [name], MD;  [facility];  Service: Endoscopy;  Laterality: N/A;   COLONOSCOPY  09/26/2018   at [facility]: prep not adequate for colon cancer surveillance.  Prior end-to-end colonic anastomosis in the rectosigmoid region.  This was patent and characterized by healthy-appearing mucosa.  Anastomosis was traversed.  Normal terminal ileum.   COLONOSCOPY WITH PROPOFOL N/A 11/04/2016   Dr. [name], status post subtotal colectomy with normal-appearing residual lower GI tract.  Next colonoscopy in 5 years.   ENTEROSCOPY N/A 04/07/2022   Procedure: ENTEROSCOPY;  Surgeon: [name], MD;  [facility];  Service: Endoscopy;  Laterality: N/A;   ESOPHAGOGASTRODUODENOSCOPY  09/28/2010   ESOPHAGOGASTRODUODENOSCOPY   12/01/2010   Cervical web status post dilation, erosive esophagitis, B1 hemigastrectomy, inflamed anastomosis   ESOPHAGOGASTRODUODENOSCOPY  04/16/2011   excoriation at [facility] c/w trauma/M-W tear, friable gastric anastomosis, dilation efferent limb   ESOPHAGOGASTRODUODENOSCOPY N/A 06/03/2014   Dr.[name]- cervcal esopphageal web s/p dilation. abnormal distal esophagus bx= barretts esophagus   ESOPHAGOGASTRODUODENOSCOPY N/A 05/15/2015   Procedure: ESOPHAGOGASTRODUODENOSCOPY (EGD);  Surgeon: [name], MD;  [facility];  Service: Endoscopy;  Laterality: N/A;  230   ESOPHAGOGASTRODUODENOSCOPY (EGD) WITH ESOPHAGEAL DILATION  02/25/2012   [id number] esophageal web-s/p dilation anddisruption as described above. Status post prior gastric with  Billroth I configuration. Abnormal gastric mucosa at the anastomosis. Gastric biopsy showed mild chronic inflammation but no H. pylori    ESOPHAGOGASTRODUODENOSCOPY (EGD) WITH PROPOFOL N/A 11/04/2016   Dr. [name]: Reflux esophagitis, small hiatal hernia status post hemigastrectomy.  Single patent efferent small bowel limb appeared normal, 2 x 2 centimeter tongue of salmon epithelium again seen, esophageal dilation.  Biopsies consistent with reflux changes, not Barrett's however this was confirmed on prior EGDs.  Offer 3-year follow-up EGD August 2021.   ESOPHAGOGASTRODUODENOSCOPY (EGD) WITH PROPOFOL N/A 03/03/2020   Procedure: ESOPHAGOGASTRODUODENOSCOPY (EGD) WITH PROPOFOL;  Surgeon: [name], MD;  [facility];  Service: Endoscopy;  Laterality: N/A;  3:00pm   ESOPHAGOGASTRODUODENOSCOPY (EGD) WITH PROPOFOL N/A 01/10/2022   Procedure: ESOPHAGOGASTRODUODENOSCOPY (EGD) WITH PROPOFOL;  Surgeon: Eloise Harman, DO;  [facility];  Service: Endoscopy;  Laterality: N/A;   ESOPHAGOGASTRODUODENOSCOPY (EGD) WITH PROPOFOL N/A 04/07/2022   Procedure: ESOPHAGOGASTRODUODENOSCOPY (EGD) WITH PROPOFOL;  Surgeon: [name], MD;  [facility];  Service: Endoscopy;  Laterality: N/A;   FLEXIBLE SIGMOIDOSCOPY N/A 01/10/2022   Procedure: FLEXIBLE SIGMOIDOSCOPY;  Surgeon: Eloise Harman, DO;  [facility];  Service: Endoscopy;  Laterality: N/A;   HERNIA REPAIR     right inguinal   HOT HEMOSTASIS  04/07/2022   Procedure: HOT HEMOSTASIS (ARGON PLASMA COAGULATION/BICAP);  Surgeon: [name], MD;  [facility];  Service: Endoscopy;;   IR IMAGING GUIDED PORT INSERTION  05/10/2022   MALONEY DILATION N/A 06/03/2014   Procedure: [name] DILATION;  Surgeon: [name], MD;  [facility];  Service: Endoscopy;  Laterality: N/A;   MALONEY DILATION N/A 05/15/2015   Procedure: [name] DILATION;  Surgeon: [name], MD;  [facility];  Service: Endoscopy;  Laterality: N/A;   MALONEY DILATION N/A 11/04/2016   Procedure: [name] DILATION;  Surgeon: [name], MD;  [facility];  Service: Endoscopy;  Laterality: N/A;   MALONEY DILATION N/A 03/03/2020   Procedure: [name] DILATION;  Surgeon: [name], MD;  [facility];  Service: Endoscopy;  Laterality: N/A;   PORT-A-CATH REMOVAL Left 04/08/2022   Procedure: MINOR REMOVAL PORT-A-CATH;  Surgeon: Rusty Aus, DO;  Location: [facility];  Service: General;  Laterality: Left;   PORTACATH PLACEMENT     SAVORY DILATION N/A 06/03/2014   Procedure: SAVORY DILATION;  Surgeon: [name], MD;  [facility];  Service: Endoscopy;  Laterality: N/A;   SUBMUCOSAL INJECTION  04/07/2022   Procedure: SUBMUCOSAL INJECTION;  Surgeon: [name], MD;  [facility];  Service: Endoscopy;;   SUBMUCOSAL TATTOO INJECTION  04/07/2022   Procedure: SUBMUCOSAL TATTOO INJECTION;  Surgeon: [name], MD;  [facility];  Service: Endoscopy;;   TEE WITHOUT CARDIOVERSION N/A 04/09/2022   Procedure: TRANSESOPHAGEAL ECHOCARDIOGRAM (TEE);  Surgeon: Arnoldo Lenis, MD;  Location: [facility];  Service: Endoscopy;  Laterality: N/A;    Social History: Social History   Socioeconomic History   Marital status: Married    Spouse name: Not on file   Number of children: 3   Years of education: Not on file   Highest education level: Not on file  Occupational History    Employer: [employer]  Tobacco Use   Smoking status: Former    Packs/day: 0.50    Years: 40.00    Additional pack years: 0.00    Total pack years:  20.00    Types: Cigarettes    Quit date: 12/20/2012    Years since quitting: 9.4   Smokeless tobacco: Never  Vaping Use   Vaping Use: Never used  Substance and Sexual Activity   Alcohol use: No   Drug use: No   Sexual activity: Not Currently  Other Topics Concern   Not on file  Social History Narrative   Not on file   Social Determinants of Health   Financial Resource Strain: Not on file  Food Insecurity: No Food Insecurity (04/03/2022)   Hunger Vital Sign    Worried About Running Out of Food in the Last Year: Never true    Ran Out of Food in the Last Year: Never true  Transportation Needs: No Transportation Needs (04/03/2022)   PRAPARE - Hydrologist (Medical): No    Lack of Transportation (Non-Medical): No  Physical Activity: Not on file  Stress: Not on file  Social Connections: Not on file  Intimate Partner Violence: Not At Risk (04/03/2022)   Humiliation, Afraid, Rape, and Kick questionnaire    Fear of Current or Ex-Partner: No    Emotionally Abused: No    Physically Abused: No    Sexually Abused: No    Family History: Family History  Problem Relation Age of Onset   Hypertension Mother    Arthritis Mother    Pneumonia Mother    Hypertension Father    Heart attack Father    Bone cancer Cousin    Colon cancer Neg Hx     Current Medications:  Current Outpatient Medications:    atorvastatin (LIPITOR) 20 MG tablet, Take 1 tablet (20 mg total) by mouth daily., Disp: , Rfl:    Bacillus Coagulans-Inulin  (PROBIOTIC) 1-250 BILLION-MG CAPS, Take 1 capsule by mouth daily., Disp: , Rfl:    bacitracin ointment, Apply topically daily., Disp: 120 g, Rfl: 0   chlorproMAZINE (THORAZINE) 25 MG tablet, Take 1 tablet (25 mg total) by mouth every 6 (six) hours as needed. (Patient taking differently: Take 25 mg by mouth every 6 (six) hours as needed for nausea or vomiting.), Disp: 30 tablet, Rfl: 0   Cholecalciferol (VITAMIN D) 2000 units CAPS, Take 2,000 Units by mouth daily., Disp: , Rfl:    CREON 36000-114000 units CPEP capsule, Take 72,000-108,000 Units by mouth See admin instructions. Take 3 capsules (108,000 units) by mouth with meals and take 2 capsules (72,000 units) with snacks., Disp: , Rfl:    cyanocobalamin 100 MCG tablet, Take by mouth., Disp: , Rfl:    DAPTOmycin (CUBICIN) 500 MG injection, , Disp: , Rfl:    diclofenac Sodium (VOLTAREN) 1 % GEL, Apply 4 g topically 4 (four) times daily., Disp: , Rfl:    dicyclomine (BENTYL) 10 MG capsule, Take 10 mg by mouth 2 (two) times daily before a meal., Disp: , Rfl:    diphenoxylate-atropine (LOMOTIL) 2.5-0.025 MG tablet, Take 1 tablet by mouth 4 times daily, Disp: 120 tablet, Rfl: 1   ELIQUIS 5 MG TABS tablet, TAKE 1 TABLET BY MOUTH TWICE A DAY, Disp: 60 tablet, Rfl: 6   escitalopram (LEXAPRO) 10 MG tablet, TAKE 1 TABLET BY MOUTH EVERY DAY, Disp: 30 tablet, Rfl: 1   escitalopram (LEXAPRO) 20 MG tablet, TAKE 1 TABLET BY MOUTH EVERY DAY, Disp: 90 tablet, Rfl: 0   feeding supplement (ENSURE ENLIVE / ENSURE PLUS) LIQD, Take 237 mLs by mouth 2 (two) times daily between meals., Disp: , Rfl:    ferrous sulfate  325 (65 FE) MG EC tablet, Take by mouth., Disp: , Rfl:    gabapentin (NEURONTIN) 300 MG capsule, Take 1 capsule (300 mg total) by mouth 3 (three) times daily., Disp: 90 capsule, Rfl: 3   HYDROcodone-acetaminophen (NORCO) 10-325 MG tablet, Take 1 tablet by mouth every 6 (six) hours as needed., Disp: 90 tablet, Rfl: 0   hydrocortisone cream 1 %, Apply 1  Application topically 2 (two) times daily., Disp: , Rfl:    HYDROmorphone (DILAUDID) 2 MG tablet, Take 1 tablet (2 mg total) by mouth every 4 (four) hours as needed for severe pain., Disp: 60 tablet, Rfl: 0   magnesium oxide (MAG-OX) 400 (240 Mg) MG tablet, TAKE 1 TABLET BY MOUTH TWICE A DAY, Disp: 180 tablet, Rfl: 3   megestrol (MEGACE) 40 MG/ML suspension, TAKE 10 MLS (400 MG TOTAL) BY MOUTH 2 (TWO) TIMES DAILY., Disp: 480 mL, Rfl: 1   mirtazapine (REMERON) 30 MG tablet, TAKE 1 TABLET BY MOUTH AT BEDTIME, Disp: 30 tablet, Rfl: 3   Multiple Vitamin (INFUVITE ADULT IV), 5 mLs See admin instructions. Inject 5 mls from each vial #1 and #2, Disp: , Rfl:    Multiple Vitamin (MULTIVITAMIN WITH MINERALS) TABS tablet, Take 1 tablet by mouth daily., Disp: , Rfl:    ondansetron (ZOFRAN-ODT) 4 MG disintegrating tablet, TAKE 1 TABLET BY MOUTH EVERY 4 HOURS AS NEEDED FOR NAUSEA AND VOMITING, Disp: 30 tablet, Rfl: 1   pantoprazole (PROTONIX) 40 MG tablet, Take 1 tablet (40 mg total) by mouth 2 (two) times daily before a meal., Disp: 180 tablet, Rfl: 3   predniSONE (DELTASONE) 50 MG tablet, Take 1 tablet (50 mg total) by mouth daily with breakfast., Disp: 5 tablet, Rfl: 0   PREVALITE 4 g packet, Take 1 packet (4 g total) by mouth daily. DO NOT TAKE WITHIN 2 HOURS OF OTHER MEDICATIONS, Disp: 90 packet, Rfl: 3   prochlorperazine (COMPAZINE) 10 MG tablet, Take 10 mg by mouth every 6 (six) hours as needed for vomiting or nausea., Disp: , Rfl:    sterile water SOLN with Travasol 10 % SOLN 50 g/L, dextrose 70 % SOLN 20 %, sodium INJ 35 mEq/L, potassium INJ 30 mEq/L, calcium GLUCONATE INJ 4.7 mEq/L, magnesium SULFATE INJ 5 mEq/L, phosphorus INJ 15 mmol/L, M.V.I. Adult INJ 10 mL, trace elements Cr-Cu-Mn-Se-Zn (MTE-5 Concentrate) 12-998-500-60 MCG/ML SOLN 1 mL, fat emulsion 30 % EMUL 100 mL/L, continuous., Disp: , Rfl:    sucralfate (CARAFATE) 1 GM/10ML suspension, TAKE 10 MLS (1 G TOTAL) BY MOUTH 4 (FOUR) TIMES DAILY AS  NEEDED. FOR BREAKTHROUGH HEARTBURN (Patient taking differently: Take 1 g by mouth 4 (four) times daily as needed (breakthrough heartburn).), Disp: 420 mL, Rfl: 5   traMADol (ULTRAM) 50 MG tablet, Take 1 tablet by mouth every 6 (six) hours as needed for moderate pain or severe pain., Disp: , Rfl:  No current facility-administered medications for this visit.  Facility-Administered Medications Ordered in Other Visits:    acetaminophen (TYLENOL) 325 MG tablet, , , ,    heparin lock flush 100 unit/mL, 500 Units, Intravenous, Once, Kefalas, Thomas S, PA-C   loratadine (CLARITIN) 10 MG tablet, , , ,    sodium chloride flush (NS) 0.9 % injection 10 mL, 10 mL, Intravenous, PRN, Holley Bouche, NP, 10 mL at 11/05/16 1136   Allergies: No Known Allergies  REVIEW OF SYSTEMS:   Review of Systems  Constitutional:  Negative for chills, fatigue and fever.  HENT:   Negative for lump/mass, mouth sores,  nosebleeds, sore throat and trouble swallowing.   Eyes:  Negative for eye problems.  Respiratory:  Negative for cough and shortness of breath.   Cardiovascular:  Negative for chest pain, leg swelling and palpitations.  Gastrointestinal:  Negative for abdominal pain, constipation, diarrhea, nausea and vomiting.  Genitourinary:  Negative for bladder incontinence, difficulty urinating, dysuria, frequency, hematuria and nocturia.   Musculoskeletal:  Negative for arthralgias, back pain, flank pain, myalgias and neck pain.  Skin:  Negative for itching and rash.  Neurological:  Negative for dizziness, headaches and numbness.  Hematological:  Does not bruise/bleed easily.  Psychiatric/Behavioral:  Negative for depression, sleep disturbance and suicidal ideas. The patient is not nervous/anxious.   All other systems reviewed and are negative.    VITALS:   There were no vitals taken for this visit.  Wt Readings from Last 3 Encounters:  06/08/22 134 lb 12.8 oz (61.1 kg)  06/07/22 134 lb 11.2 oz (61.1 kg)   05/18/22 142 lb (64.4 kg)    There is no height or weight on file to calculate BMI.  Performance status (ECOG): 1 - Symptomatic but completely ambulatory  PHYSICAL EXAM:   Physical Exam Vitals and nursing note reviewed. Exam conducted with a chaperone present.  Constitutional:      Appearance: Normal appearance.  Cardiovascular:     Rate and Rhythm: Normal rate and regular rhythm.     Pulses: Normal pulses.     Heart sounds: Normal heart sounds.  Pulmonary:     Effort: Pulmonary effort is normal.     Breath sounds: Normal breath sounds.  Abdominal:     Palpations: Abdomen is soft. There is no hepatomegaly, splenomegaly or mass.     Tenderness: There is no abdominal tenderness.  Musculoskeletal:     Right lower leg: No edema.     Left lower leg: No edema.  Lymphadenopathy:     Cervical: No cervical adenopathy.     Right cervical: No superficial, deep or posterior cervical adenopathy.    Left cervical: No superficial, deep or posterior cervical adenopathy.     Upper Body:     Right upper body: No supraclavicular or axillary adenopathy.     Left upper body: No supraclavicular or axillary adenopathy.  Neurological:     General: No focal deficit present.     Mental Status: He is alert and oriented to person, place, and time.  Psychiatric:        Mood and Affect: Mood normal.        Behavior: Behavior normal.     LABS:      Latest Ref Rng & Units 05/10/2022    9:13 AM 04/19/2022    1:58 PM 04/12/2022    7:46 AM  CBC  WBC 4.0 - 10.5 K/uL 7.6  9.1  6.5   Hemoglobin 13.0 - 17.0 g/dL 10.2  9.8  8.3   Hematocrit 39.0 - 52.0 % 30.8  31.1  26.5   Platelets 150 - 400 K/uL 153  223  210       Latest Ref Rng & Units 04/19/2022    1:58 PM 04/12/2022    4:43 AM 04/11/2022    4:54 AM  CMP  Glucose 70 - 99 mg/dL 120  118  92   BUN 8 - 23 mg/dL 21  11  13    Creatinine 0.61 - 1.24 mg/dL 1.20  1.16  1.14   Sodium 135 - 145 mmol/L 133  137  138   Potassium 3.5 -  5.1 mmol/L 5.1   4.4  4.3   Chloride 98 - 111 mmol/L 108  115  114   CO2 22 - 32 mmol/L 18  20  20    Calcium 8.9 - 10.3 mg/dL 7.7  7.6  7.5   Total Protein 6.5 - 8.1 g/dL 6.8  5.2  5.0   Total Bilirubin 0.3 - 1.2 mg/dL 0.2  0.3  0.3   Alkaline Phos 38 - 126 U/L 100  80  74   AST 15 - 41 U/L 39  20  22   ALT 0 - 44 U/L 40  30  32      Lab Results  Component Value Date   CEA1 3.4 12/30/2021   CEA 2.9 04/30/2016   /  CEA  Date Value Ref Range Status  12/30/2021 3.4 0.0 - 4.7 ng/mL Final    Comment:    (NOTE)                             Nonsmokers          <3.9                             Smokers             <5.6 [employer] Electrochemiluminescence Immunoassay (ECLIA) Values obtained with different assay methods or kits cannot be used interchangeably.  Results cannot be interpreted as absolute evidence of the presence or absence of malignant disease. Performed At: [facility] [address] [place], [place] [id number] Rush Farmer MD [id number]   04/30/2016 2.9 0.0 - 4.7 ng/mL Final    Comment:    (NOTE)       [facility] ECLIA methodology       Nonsmokers  <3.9                                     Smokers     <5.6 Performed At: [facility] [place], [place] [id number] Lindon Romp MD [id number]    No results found for: "PSA1" Lab Results  Component Value Date   CAN199 <2 12/30/2021   No results found for: "CAN125"  No results found for: "TOTALPROTELP", "ALBUMINELP", "A1GS", "A2GS", "BETS", "BETA2SER", "GAMS", "MSPIKE", "SPEI" Lab Results  Component Value Date   TIBC 354 04/19/2022   TIBC 198 (L) 03/24/2022   TIBC 138 (L) 03/04/2022   FERRITIN 536 (H) 04/19/2022   FERRITIN 519 (H) 03/04/2022   FERRITIN 979 (H) 01/09/2022   IRONPCTSAT 31 04/19/2022   IRONPCTSAT 50 (H) 03/24/2022   IRONPCTSAT 48 (H) 03/04/2022   Lab Results  Component Value Date   LDH 151 11/22/2019   LDH 109 08/16/2018   LDH 146 04/30/2016     STUDIES:    MR TOTAL SPINE METS SCREENING  Result Date: 05/29/2022 CLINICAL DATA:  Bone metastases. Left lower extremity pain radiating to the foot. EXAM: MRI TOTAL SPINE WITHOUT AND WITH CONTRAST TECHNIQUE: Multisequence MR imaging of the spine from the cervical spine to the sacrum was performed prior to and following IV contrast administration for evaluation of spinal metastatic disease. CONTRAST:  6.5mL GADAVIST GADOBUTROL 1 MMOL/ML IV SOLN COMPARISON:  PET-CT 04/22/2022.  Chest CTA 09/24/2017. FINDINGS: MRI CERVICAL SPINE FINDINGS Alignment: No significant listhesis. Vertebrae: 1 cm T1 hypointense, STIR hyperintense, enhancing lesion  in the upper right lateral C2 vertebral body. Few possible additional scattered subcentimeter lesions elsewhere, for example a 4 mm focus in the C7 vertebral body to the left of midline. No evidence of epidural tumor or fracture. Cord: No cord signal abnormality or abnormal intradural enhancement identified. Posterior fossa and paraspinal tissues: Grossly unremarkable. Disc levels: Mild diffuse cervical spondylosis and right greater than left facet hypertrophy, not evaluated on detail on this study which does not include axial images through the cervical spine. No evidence of high-grade spinal canal stenosis. MRI THORACIC SPINE FINDINGS Alignment: Exaggerated upper thoracic kyphosis. Mild upper thoracic dextroscoliosis. No significant listhesis. Vertebrae: Chronic, moderate T8 superior endplate compression fracture, unchanged from 2019. No acute fracture. Likely hemangioma in the T7 vertebral body to the right of midline. T1 hypointense, STIR hyperintense, enhancing lesions in the T7, T9, T10, and T11 vertebral bodies, with the largest lesions being in the left half of the T11 vertebral body extending into the left pedicle (measuring approximately 2 cm each). Lesions in the posterior left sixth, eighth, and ninth ribs. No evidence of epidural tumor. Cord: No cord signal abnormality or  abnormal intradural enhancement identified. Paraspinal and other soft tissues: Grossly unremarkable. Disc levels: No evidence of significant disc degeneration for age or significant spinal stenosis. 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 Alignment:  Normal. Vertebrae: Scattered small enhancing lesions throughout the vertebral bodies and posterior elements of the lumbar spine and in the sacrum and posterior ilia, including a 1.5 cm lesion anteriorly in the L5 vertebral body, a 1.6 cm lesion to the right of midline at S2, and a partially visualized lesion in the posterior right ilium measuring greater than 2 cm. No evidence of epidural tumor or fracture. Conus medullaris: Extends to the lower L2 level and appears normal. Paraspinal and other soft tissues: 1.2 cm presumably enhancing mass in the right lateral posterior paraspinal musculature at L4 without a corresponding abnormality seen on the prior PET-CT. Right renal cysts for which no follow-up imaging is recommended. Disc levels: L1-2 through L3-4: No disc herniation or stenosis. L4-5: Disc bulging and mild-to-moderate right and mild left facet and ligamentum flavum hypertrophy without significant stenosis. L5-S1: Disc bulging, a left subarticular disc protrusion, and mild facet hypertrophy result in mild right and moderate to severe left lateral recess stenosis with posterior displacement of the left S1 nerve root. Mild bilateral neural foraminal stenosis. No significant spinal stenosis. IMPRESSION: 1. Numerous enhancing lesions throughout the spine and pelvis consistent with metastases in progressed in number and size from the 04/22/2022 PET-CT. No epidural tumor or pathologic fracture. 2. 1.2 cm mass in the right posterior paraspinal musculature at L4, suspicious for a metastasis and new from the prior PET-CT. 3. Disc protrusion at L5-S1 resulting in moderate to severe left lateral recess stenosis  and left S1 nerve root impingement. Electronically Signed   By: Logan Bores M.D.   On: 05/29/2022 17:24   US Venous Img Lower Unilateral Left  Result Date: 05/24/2022 CLINICAL DATA:  Left lower extremity pain for 6 days EXAM: LEFT LOWER EXTREMITY VENOUS DOPPLER ULTRASOUND TECHNIQUE: Gray-scale sonography with graded compression, as well as color Doppler and duplex ultrasound were performed to evaluate the lower extremity deep venous systems from the level of the common femoral vein and including the common femoral, femoral, profunda femoral, popliteal and calf veins including the posterior tibial, peroneal and gastrocnemius veins when visible. The superficial great saphenous vein was also interrogated. Spectral Doppler was  utilized to evaluate flow at rest and with distal augmentation maneuvers in the common femoral, femoral and popliteal veins. COMPARISON:  None Available. FINDINGS: Contralateral Common Femoral Vein: Respiratory phasicity is normal and symmetric with the symptomatic side. No evidence of thrombus. Normal compressibility. Common Femoral Vein: No evidence of thrombus. Normal compressibility, respiratory phasicity and response to augmentation. Saphenofemoral Junction: No evidence of thrombus. Normal compressibility and flow on color Doppler imaging. Profunda Femoral Vein: No evidence of thrombus. Normal compressibility and flow on color Doppler imaging. Femoral Vein: No evidence of thrombus. Normal compressibility, respiratory phasicity and response to augmentation. Popliteal Vein: No evidence of thrombus. Normal compressibility, respiratory phasicity and response to augmentation. Calf Veins: No evidence of thrombus. Normal compressibility and flow on color Doppler imaging. Superficial Great Saphenous Vein: No evidence of thrombus. Normal compressibility. IMPRESSION: No evidence of deep venous thrombosis. Electronically Signed   By: Jerilynn Mages.  Shick M.D.   On: 05/24/2022 14:39   DG FEMUR MIN 2 VIEWS  LEFT  Result Date: 05/20/2022 CLINICAL DATA:  Pain EXAM: LEFT FEMUR 4 VIEWS COMPARISON:  None Available. FINDINGS: No fracture, dislocation or subluxation. No osteolytic or osteoblastic lesions. IMPRESSION: Negative. Electronically Signed   By: Sammie Bench M.D.   On: 05/20/2022 16:11

## 2022-06-22 ENCOUNTER — Other Ambulatory Visit: Payer: Self-pay

## 2022-07-01 ENCOUNTER — Telehealth: Payer: Self-pay

## 2022-07-01 NOTE — Telephone Encounter (Signed)
Pt's wife called stating that the patient passed away Tuesday July 01, 2022. Wife wanted to let you know that they were very pleased with the care the patient received.

## 2022-07-21 DEATH — deceased

## 2022-08-12 ENCOUNTER — Other Ambulatory Visit: Payer: Self-pay | Admitting: Gastroenterology
